# Patient Record
Sex: Female | Born: 1955 | Race: White | Hispanic: No | Marital: Married | Smoking: Former smoker
Health system: Southern US, Community
[De-identification: ages and names within clinical notes are randomized; demographics above are authoritative.]

## PROBLEM LIST (undated history)

## (undated) DIAGNOSIS — E119 Type 2 diabetes mellitus without complications: Secondary | ICD-10-CM

## (undated) DIAGNOSIS — N184 Chronic kidney disease, stage 4 (severe): Secondary | ICD-10-CM

## (undated) DIAGNOSIS — K759 Inflammatory liver disease, unspecified: Secondary | ICD-10-CM

## (undated) DIAGNOSIS — R0600 Dyspnea, unspecified: Secondary | ICD-10-CM

## (undated) DIAGNOSIS — E669 Obesity, unspecified: Secondary | ICD-10-CM

## (undated) DIAGNOSIS — D563 Thalassemia minor: Secondary | ICD-10-CM

## (undated) DIAGNOSIS — G4733 Obstructive sleep apnea (adult) (pediatric): Secondary | ICD-10-CM

## (undated) DIAGNOSIS — K643 Fourth degree hemorrhoids: Secondary | ICD-10-CM

## (undated) DIAGNOSIS — G629 Polyneuropathy, unspecified: Secondary | ICD-10-CM

## (undated) DIAGNOSIS — K7581 Nonalcoholic steatohepatitis (NASH): Secondary | ICD-10-CM

## (undated) DIAGNOSIS — K7460 Unspecified cirrhosis of liver: Secondary | ICD-10-CM

## (undated) DIAGNOSIS — R0989 Other specified symptoms and signs involving the circulatory and respiratory systems: Secondary | ICD-10-CM

## (undated) DIAGNOSIS — R188 Other ascites: Secondary | ICD-10-CM

## (undated) DIAGNOSIS — J189 Pneumonia, unspecified organism: Secondary | ICD-10-CM

## (undated) DIAGNOSIS — M1990 Unspecified osteoarthritis, unspecified site: Secondary | ICD-10-CM

## (undated) DIAGNOSIS — Z95828 Presence of other vascular implants and grafts: Secondary | ICD-10-CM

## (undated) DIAGNOSIS — I10 Essential (primary) hypertension: Secondary | ICD-10-CM

## (undated) DIAGNOSIS — E1140 Type 2 diabetes mellitus with diabetic neuropathy, unspecified: Secondary | ICD-10-CM

## (undated) DIAGNOSIS — K766 Portal hypertension: Secondary | ICD-10-CM

## (undated) DIAGNOSIS — K219 Gastro-esophageal reflux disease without esophagitis: Secondary | ICD-10-CM

## (undated) DIAGNOSIS — G4730 Sleep apnea, unspecified: Secondary | ICD-10-CM

## (undated) DIAGNOSIS — F419 Anxiety disorder, unspecified: Secondary | ICD-10-CM

## (undated) HISTORY — DX: Unspecified cirrhosis of liver: K74.60

## (undated) HISTORY — PX: OTHER SURGICAL HISTORY: SHX169

## (undated) HISTORY — PX: ABDOMINAL HYSTERECTOMY: SHX81

## (undated) HISTORY — PX: JOINT REPLACEMENT: SHX530

## (undated) MED FILL — Iron Sucrose Inj 20 MG/ML (Fe Equiv): INTRAVENOUS | Qty: 15 | Status: AC

---

## 1990-08-18 DIAGNOSIS — D563 Thalassemia minor: Secondary | ICD-10-CM

## 1990-08-18 HISTORY — DX: Thalassemia minor: D56.3

## 2007-03-13 ENCOUNTER — Ambulatory Visit: Payer: Self-pay | Admitting: Neurology

## 2009-04-18 ENCOUNTER — Ambulatory Visit: Payer: Self-pay | Admitting: Gynecologic Oncology

## 2009-04-24 ENCOUNTER — Ambulatory Visit: Payer: Self-pay | Admitting: Gynecologic Oncology

## 2009-05-12 ENCOUNTER — Emergency Department: Payer: Self-pay | Admitting: Emergency Medicine

## 2009-05-18 ENCOUNTER — Ambulatory Visit: Payer: Self-pay | Admitting: Gynecologic Oncology

## 2009-06-12 ENCOUNTER — Ambulatory Visit: Payer: Self-pay | Admitting: Gynecologic Oncology

## 2009-06-18 ENCOUNTER — Ambulatory Visit: Payer: Self-pay | Admitting: Gynecologic Oncology

## 2009-10-16 ENCOUNTER — Ambulatory Visit: Payer: Self-pay | Admitting: Gynecologic Oncology

## 2010-04-18 ENCOUNTER — Ambulatory Visit: Payer: Self-pay | Admitting: Gynecologic Oncology

## 2010-05-07 ENCOUNTER — Ambulatory Visit: Payer: Self-pay | Admitting: Gynecologic Oncology

## 2011-03-19 ENCOUNTER — Ambulatory Visit: Payer: Self-pay | Admitting: Family

## 2011-04-19 ENCOUNTER — Ambulatory Visit: Payer: Self-pay | Admitting: Family

## 2011-05-19 ENCOUNTER — Ambulatory Visit: Payer: Self-pay | Admitting: Family

## 2013-02-04 ENCOUNTER — Emergency Department: Payer: Self-pay | Admitting: Emergency Medicine

## 2015-09-27 HISTORY — PX: TOTAL SHOULDER ARTHROPLASTY: SHX126

## 2016-06-04 ENCOUNTER — Encounter: Payer: Self-pay | Admitting: *Deleted

## 2016-06-11 ENCOUNTER — Ambulatory Visit
Admission: RE | Admit: 2016-06-11 | Discharge: 2016-06-11 | Disposition: A | Discharge status: Home / Self Care | Payer: Managed Care, Other (non HMO) | Source: Ambulatory Visit | Attending: Ophthalmology | Admitting: Ophthalmology

## 2016-06-11 ENCOUNTER — Encounter: Payer: Self-pay | Admitting: *Deleted

## 2016-06-11 ENCOUNTER — Encounter
Admission: RE | Disposition: A | Discharge status: Home / Self Care | Payer: Self-pay | Source: Ambulatory Visit | Attending: Ophthalmology

## 2016-06-11 ENCOUNTER — Ambulatory Visit: Payer: Managed Care, Other (non HMO) | Admitting: Certified Registered Nurse Anesthetist

## 2016-06-11 DIAGNOSIS — K219 Gastro-esophageal reflux disease without esophagitis: Secondary | ICD-10-CM | POA: Insufficient documentation

## 2016-06-11 DIAGNOSIS — D563 Thalassemia minor: Secondary | ICD-10-CM | POA: Insufficient documentation

## 2016-06-11 DIAGNOSIS — E1140 Type 2 diabetes mellitus with diabetic neuropathy, unspecified: Secondary | ICD-10-CM | POA: Insufficient documentation

## 2016-06-11 DIAGNOSIS — F419 Anxiety disorder, unspecified: Secondary | ICD-10-CM | POA: Diagnosis not present

## 2016-06-11 DIAGNOSIS — H2512 Age-related nuclear cataract, left eye: Secondary | ICD-10-CM | POA: Diagnosis present

## 2016-06-11 DIAGNOSIS — Z7984 Long term (current) use of oral hypoglycemic drugs: Secondary | ICD-10-CM | POA: Insufficient documentation

## 2016-06-11 DIAGNOSIS — G4730 Sleep apnea, unspecified: Secondary | ICD-10-CM | POA: Insufficient documentation

## 2016-06-11 DIAGNOSIS — D649 Anemia, unspecified: Secondary | ICD-10-CM | POA: Insufficient documentation

## 2016-06-11 DIAGNOSIS — M1990 Unspecified osteoarthritis, unspecified site: Secondary | ICD-10-CM | POA: Diagnosis not present

## 2016-06-11 DIAGNOSIS — I10 Essential (primary) hypertension: Secondary | ICD-10-CM | POA: Insufficient documentation

## 2016-06-11 DIAGNOSIS — Z90710 Acquired absence of both cervix and uterus: Secondary | ICD-10-CM | POA: Diagnosis not present

## 2016-06-11 HISTORY — DX: Type 2 diabetes mellitus without complications: E11.9

## 2016-06-11 HISTORY — PX: CATARACT EXTRACTION W/PHACO: SHX586

## 2016-06-11 HISTORY — DX: Inflammatory liver disease, unspecified: K75.9

## 2016-06-11 HISTORY — DX: Sleep apnea, unspecified: G47.30

## 2016-06-11 HISTORY — DX: Anxiety disorder, unspecified: F41.9

## 2016-06-11 HISTORY — DX: Polyneuropathy, unspecified: G62.9

## 2016-06-11 HISTORY — DX: Essential (primary) hypertension: I10

## 2016-06-11 HISTORY — DX: Thalassemia minor: D56.3

## 2016-06-11 HISTORY — DX: Unspecified osteoarthritis, unspecified site: M19.90

## 2016-06-11 HISTORY — DX: Other specified symptoms and signs involving the circulatory and respiratory systems: R09.89

## 2016-06-11 LAB — GLUCOSE, CAPILLARY
GLUCOSE-CAPILLARY: 338 mg/dL — AB (ref 65–99)
Glucose-Capillary: 307 mg/dL — ABNORMAL HIGH (ref 65–99)

## 2016-06-11 SURGERY — PHACOEMULSIFICATION, CATARACT, WITH IOL INSERTION
Anesthesia: Monitor Anesthesia Care | Site: Eye | Laterality: Left | Wound class: Clean

## 2016-06-11 MED ORDER — MOXIFLOXACIN HCL 0.5 % OP SOLN
OPHTHALMIC | Status: AC
  Filled 2016-06-11: qty 3

## 2016-06-11 MED ORDER — TETRACAINE HCL 0.5 % OP SOLN
OPHTHALMIC | Status: AC
  Filled 2016-06-11: qty 2

## 2016-06-11 MED ORDER — ACETAMINOPHEN 325 MG PO TABS
ORAL_TABLET | ORAL | Status: AC
  Filled 2016-06-11: qty 2

## 2016-06-11 MED ORDER — TETRACAINE HCL 0.5 % OP SOLN
OPHTHALMIC | Status: DC | PRN
  Administered 2016-06-11: 2 [drp] via OPHTHALMIC

## 2016-06-11 MED ORDER — ACETAMINOPHEN 325 MG PO TABS
650.0000 mg | ORAL_TABLET | Freq: Once | ORAL | Status: AC
  Administered 2016-06-11: 650 mg via ORAL

## 2016-06-11 MED ORDER — MIDAZOLAM HCL 2 MG/2ML IJ SOLN
INTRAMUSCULAR | Status: DC | PRN
  Administered 2016-06-11 (×2): 1 mg via INTRAVENOUS
  Administered 2016-06-11: 0.5 mg via INTRAVENOUS

## 2016-06-11 MED ORDER — NA CHONDROIT SULF-NA HYALURON 40-17 MG/ML IO SOLN
INTRAOCULAR | Status: AC
  Filled 2016-06-11: qty 1

## 2016-06-11 MED ORDER — CEFUROXIME OPHTHALMIC INJECTION 1 MG/0.1 ML
INJECTION | OPHTHALMIC | Status: DC | PRN
  Administered 2016-06-11: 1 mg via INTRACAMERAL

## 2016-06-11 MED ORDER — EPINEPHRINE PF 1 MG/ML IJ SOLN
INTRAOCULAR | Status: DC | PRN
  Administered 2016-06-11: 200 mL via OPHTHALMIC

## 2016-06-11 MED ORDER — LIDOCAINE HCL (PF) 4 % IJ SOLN
INTRAMUSCULAR | Status: AC
  Filled 2016-06-11: qty 5

## 2016-06-11 MED ORDER — CEFUROXIME OPHTHALMIC INJECTION 1 MG/0.1 ML
INJECTION | OPHTHALMIC | Status: AC
  Filled 2016-06-11: qty 0.1

## 2016-06-11 MED ORDER — POVIDONE-IODINE 5 % OP SOLN
OPHTHALMIC | Status: DC | PRN
  Administered 2016-06-11: 1 via OPHTHALMIC

## 2016-06-11 MED ORDER — LIDOCAINE HCL (PF) 4 % IJ SOLN
INTRAMUSCULAR | Status: DC | PRN
  Administered 2016-06-11: 4 mL

## 2016-06-11 MED ORDER — CARBACHOL 0.01 % IO SOLN
INTRAOCULAR | Status: DC | PRN
  Administered 2016-06-11: 0.5 mL via INTRAOCULAR

## 2016-06-11 MED ORDER — MOXIFLOXACIN HCL 0.5 % OP SOLN
OPHTHALMIC | Status: DC | PRN
  Administered 2016-06-11: 1 [drp] via OPHTHALMIC

## 2016-06-11 MED ORDER — MOXIFLOXACIN HCL 0.5 % OP SOLN
1.0000 [drp] | OPHTHALMIC | Status: AC
  Administered 2016-06-11 (×3): 1 [drp] via OPHTHALMIC

## 2016-06-11 MED ORDER — PHENYLEPHRINE HCL 10 % OP SOLN
1.0000 [drp] | OPHTHALMIC | Status: AC
  Administered 2016-06-11 (×4): 1 [drp] via OPHTHALMIC

## 2016-06-11 MED ORDER — SODIUM CHLORIDE 0.9 % IV SOLN
INTRAVENOUS | Status: DC
  Administered 2016-06-11: 07:00:00 via INTRAVENOUS

## 2016-06-11 MED ORDER — BUPIVACAINE HCL (PF) 0.75 % IJ SOLN
INTRAMUSCULAR | Status: AC
  Filled 2016-06-11: qty 10

## 2016-06-11 MED ORDER — CYCLOPENTOLATE HCL 2 % OP SOLN
1.0000 [drp] | OPHTHALMIC | Status: AC
  Administered 2016-06-11 (×3): 1 [drp] via OPHTHALMIC

## 2016-06-11 MED ORDER — LIDOCAINE HCL (PF) 4 % IJ SOLN
INTRAOCULAR | Status: DC | PRN
  Administered 2016-06-11: 4 mL via OPHTHALMIC

## 2016-06-11 MED ORDER — POVIDONE-IODINE 5 % OP SOLN
OPHTHALMIC | Status: AC
  Filled 2016-06-11: qty 30

## 2016-06-11 MED ORDER — CYCLOPENTOLATE HCL 2 % OP SOLN
OPHTHALMIC | Status: AC
  Administered 2016-06-11: 1 [drp]
  Filled 2016-06-11: qty 2

## 2016-06-11 MED ORDER — TRYPAN BLUE 0.06 % OP SOLN
OPHTHALMIC | Status: AC
  Filled 2016-06-11: qty 0.5

## 2016-06-11 MED ORDER — EPINEPHRINE PF 1 MG/ML IJ SOLN
INTRAMUSCULAR | Status: AC
  Filled 2016-06-11: qty 2

## 2016-06-11 MED ORDER — PHENYLEPHRINE HCL 10 % OP SOLN
OPHTHALMIC | Status: AC
  Filled 2016-06-11: qty 5

## 2016-06-11 MED ORDER — HYALURONIDASE HUMAN 150 UNIT/ML IJ SOLN
INTRAMUSCULAR | Status: AC
  Filled 2016-06-11: qty 1

## 2016-06-11 MED ORDER — NA CHONDROIT SULF-NA HYALURON 40-17 MG/ML IO SOLN
INTRAOCULAR | Status: DC | PRN
  Administered 2016-06-11: 1 mL via INTRAOCULAR

## 2016-06-11 MED ORDER — ALFENTANIL 500 MCG/ML IJ INJ
INJECTION | INTRAMUSCULAR | Status: DC | PRN
  Administered 2016-06-11: 500 ug via INTRAVENOUS
  Administered 2016-06-11: 100 ug via INTRAVENOUS
  Administered 2016-06-11 (×2): 150 ug via INTRAVENOUS
  Administered 2016-06-11: 100 ug via INTRAVENOUS

## 2016-06-11 SURGICAL SUPPLY — 30 items
CANNULA ANT/CHMB 27GA (MISCELLANEOUS) ×2 IMPLANT
CORD BIP STRL DISP 12FT (MISCELLANEOUS) ×2 IMPLANT
CUP MEDICINE 2OZ PLAST GRAD ST (MISCELLANEOUS) ×2 IMPLANT
DRAPE XRAY CASSETTE 23X24 (DRAPES) ×2 IMPLANT
ERASER HMR WETFIELD 18G (MISCELLANEOUS) ×2 IMPLANT
GLOVE BIO SURGEON STRL SZ8 (GLOVE) ×2 IMPLANT
GLOVE SURG LX 6.5 MICRO (GLOVE) ×1
GLOVE SURG LX 8.0 MICRO (GLOVE) ×1
GLOVE SURG LX STRL 6.5 MICRO (GLOVE) ×1 IMPLANT
GLOVE SURG LX STRL 8.0 MICRO (GLOVE) ×1 IMPLANT
GOWN STRL REUS W/ TWL LRG LVL3 (GOWN DISPOSABLE) ×1 IMPLANT
GOWN STRL REUS W/ TWL XL LVL3 (GOWN DISPOSABLE) ×1 IMPLANT
GOWN STRL REUS W/TWL LRG LVL3 (GOWN DISPOSABLE) ×1
GOWN STRL REUS W/TWL XL LVL3 (GOWN DISPOSABLE) ×1
LENS IOL ACRSF IQ ULTRA 18.0 (Intraocular Lens) ×1 IMPLANT
LENS IOL ACRYSOF IQ 18.0 (Intraocular Lens) ×2 IMPLANT
PACK CATARACT (MISCELLANEOUS) ×2 IMPLANT
PACK CATARACT DINGLEDEIN LX (MISCELLANEOUS) ×2 IMPLANT
PACK EYE AFTER SURG (MISCELLANEOUS) ×2 IMPLANT
SHLD EYE VISITEC  UNIV (MISCELLANEOUS) ×2 IMPLANT
SOL BSS BAG (MISCELLANEOUS) ×2
SOL PREP PVP 2OZ (MISCELLANEOUS) ×2
SOLUTION BSS BAG (MISCELLANEOUS) ×1 IMPLANT
SOLUTION PREP PVP 2OZ (MISCELLANEOUS) ×1 IMPLANT
SUT SILK 5-0 (SUTURE) ×2 IMPLANT
SYR 3ML LL SCALE MARK (SYRINGE) ×2 IMPLANT
SYR 5ML LL (SYRINGE) ×2 IMPLANT
SYR TB 1ML 27GX1/2 LL (SYRINGE) ×2 IMPLANT
WATER STERILE IRR 250ML POUR (IV SOLUTION) ×2 IMPLANT
WIPE NON LINTING 3.25X3.25 (MISCELLANEOUS) ×2 IMPLANT

## 2016-06-11 NOTE — H&P (Signed)
See scanned note.

## 2016-06-11 NOTE — Op Note (Signed)
Date of Surgery: 06/11/2016 Date of Dictation: 06/11/2016 8:24 AM Pre-operative Diagnosis:  Nuclear Sclerotic Cataract left Eye Post-operative Diagnosis: same Procedure performed: Extra-capsular Cataract Extraction (ECCE) with placement of a posterior chamber intraocular lens (IOL) left Eye IOL:  Implant Name Type Inv. Item Serial No. Manufacturer Lot No. LRB No. Used  LENS IOL ACRYSOF IQ 18.0 - [id number] Intraocular Lens LENS IOL ACRYSOF IQ 18.0 [id number] Moshe Salisbury [id number] Left 1   Anesthesia: 2% Lidocaine and 4% Marcaine in a 50/50 mixture with 10 unites/ml of Hylenex given as a peribulbar Anesthesiologist: Anesthesiologist: Martha Clan, MD CRNA: Demetrius Charity, CRNA Complications: none Estimated Blood Loss: less than 1 ml  Description of procedure:  The patient was given anesthesia and sedation via intravenous access. The patient was then prepped and draped in the usual fashion. A 25-gauge needle was bent for initiating the capsulorhexis. A 5-0 silk suture was placed through the conjunctiva superior and inferiorly to serve as bridle sutures. Hemostasis was obtained at the superior limbus using an eraser cautery. A partial thickness groove was made at the anterior surgical limbus with a 64 Beaver blade and this was dissected anteriorly with an [employer]. The anterior chamber was entered at 10 o'clock with a 1.0 mm paracentesis knife and through the lamellar dissection with a 2.6 mm Alcon keratome. Epi-Shugarcaine 0.5 CC [9 cc BSS Plus (Alcon), 3 cc 4% preservative-free lidocaine (Hospira) and 4 cc 1:1000 preservative-free, bisulfite-free epinephrine] was injected into the anterior chamber via the paracentesis tract. Epi-Shugarcaine 0.5 CC [9 cc BSS Plus (Alcon), 3 cc 4% preservative-free lidocaine (Hospira) and 4 cc 1:1000 preservative-free, bisulfite-free epinephrine] was injected into the anterior chamber via the paracentesis tract. DiscoVisc was injected to replace the  aqueous and a continuous tear curvilinear capsulorhexis was performed using a bent 25-gauge needle.  Balance salt on a syringe was used to perform hydro-dissection and phacoemulsification was carried out using a divide and conquer technique. Procedure(s) with comments: CATARACT EXTRACTION PHACO AND INTRAOCULAR LENS PLACEMENT ([facility]) (Left) - Lot # [id number] H US:01:38.6 AP%:26.4 CDE:44.15. Irrigation/aspiration was used to remove the residual cortex and the capsular bag was inflated with DiscoVisc. The intraocular lens was inserted into the capsular bag using a pre-loaded UltraSert Delivery System. Irrigation/aspiration was used to remove the residual DiscoVisc. The wound was inflated with balanced salt and checked for leaks. None were found. Miostat was injected via the paracentesis track and 0.1 ml of cefuroxime containing 1 mg of drug  was injected via the paracentesis track. The wound was checked for leaks again and none were found.   The bridal sutures were removed and two drops of Vigamox were placed on the eye. An eye shield was placed to protect the eye and the patient was discharged to the recovery area in good condition.   Nimsi Males MD

## 2016-06-11 NOTE — Progress Notes (Signed)
Dr Rosey Bath and Dr Dingeldein aware of GLU [id number].

## 2016-06-11 NOTE — Discharge Instructions (Signed)
Eye Surgery Discharge Instructions   Expect mild scratchy sensation or mild soreness. DO NOT RUB YOUR EYE!  The day of surgery:  Minimal physical activity, but bed rest is not required  No reading, computer work, or close hand work  No bending, lifting, or straining.  May watch TV  For 24 hours:  No driving, legal decisions, or alcoholic beverages  Safety precautions  Eat anything you prefer: It is better to start with liquids, then soup then solid foods.  _____ Eye patch should be worn until postoperative exam tomorrow.  ____ Solar shield eyeglasses should be worn for comfort in the sunlight/patch while sleeping  Resume all regular medications including aspirin or Coumadin if these were discontinued prior to surgery. You may shower, bathe, shave, or wash your hair. Tylenol may be taken for mild discomfort.  Call your doctor if you experience significant pain, nausea, or vomiting, fever > 101 or other signs of infection. [phone] or [phone] Specific instructions:  Follow-up Information    Aitanna Haubner, MD. Go on 06/12/2016.   Specialty:  Ophthalmology Why:  Appointment time is 9:45 am (TOMORROW) Contact information: [address]   [place] [address] [phone]

## 2016-06-11 NOTE — Anesthesia Preprocedure Evaluation (Signed)
Anesthesia Evaluation  Patient identified by MRN, date of birth, ID band Patient awake    Reviewed: Allergy & Precautions, H&P , NPO status , Patient's Chart, lab work & pertinent test results, reviewed documented beta blocker date and time   History of Anesthesia Complications Negative for: history of anesthetic complications  Airway Mallampati: III  TM Distance: >3 FB Neck ROM: full    Dental  (+) Caps, Missing   Pulmonary neg shortness of breath, sleep apnea and Continuous Positive Airway Pressure Ventilation , neg COPD, neg recent URI,    Pulmonary exam normal breath sounds clear to auscultation       Cardiovascular Exercise Tolerance: Good hypertension, (-) angina(-) CAD, (-) Past MI, (-) Cardiac Stents and (-) CABG Normal cardiovascular exam(-) dysrhythmias (-) Valvular Problems/Murmurs Rhythm:regular Rate:Normal     Neuro/Psych negative neurological ROS  negative psych ROS   GI/Hepatic GERD  ,(+) Hepatitis - (unknown type, resolved)  Endo/Other  diabetesMorbid obesity  Renal/GU negative Renal ROS  negative genitourinary   Musculoskeletal   Abdominal   Peds  Hematology  (+) Blood dyscrasia (Thalassemia minor), anemia ,   Anesthesia Other Findings Past Medical History: No date: Anxiety No date: Arthritis No date: Diabetes mellitus without complication (HCC) No date: Hepatitis     Comment: PAST No date: Hypertension No date: Neuropathy (HCC) No date: Sinus complaint No date: Sleep apnea     Comment: CPAP 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   Dental Advisory Given  Plan Discussed with: Anesthesiologist, CRNA and Surgeon  Anesthesia Plan Comments:         Anesthesia Quick Evaluation

## 2016-06-11 NOTE — Anesthesia Postprocedure Evaluation (Signed)
Anesthesia Post Note  Patient: Rachel Huber  Procedure(s) Performed: Procedure(s) (LRB): CATARACT EXTRACTION PHACO AND INTRAOCULAR LENS PLACEMENT ([facility]) (Left)  Patient location during evaluation: PACU Anesthesia Type: MAC Level of consciousness: oriented and awake and alert Pain management: satisfactory to patient Vital Signs Assessment: post-procedure vital signs reviewed and stable Respiratory status: respiratory function stable Cardiovascular status: stable Anesthetic complications: no    Last Vitals:  Vitals:   06/11/16 0616 06/11/16 0825  BP: (!) 151/66 (!) 112/46  Pulse: 87 78  Resp: 12 14  Temp: 36.6 C 37.1 C    Last Pain:  Vitals:   06/11/16 0616  TempSrc: Oral                 Blima Singer

## 2016-06-11 NOTE — Anesthesia Procedure Notes (Signed)
Procedure Name: MAC Performed by: Demetrius Charity Pre-anesthesia Checklist: Patient identified, Emergency Drugs available, Suction available, Timeout performed and Patient being monitored Oxygen Delivery Method: Nasal cannula

## 2016-06-11 NOTE — Interval H&P Note (Signed)
History and Physical Interval Note:  06/11/2016 7:23 AM  Rachel Huber  has presented today for surgery, with the diagnosis of CATARACT  The various methods of treatment have been discussed with the patient and family. After consideration of risks, benefits and other options for treatment, the patient has consented to  Procedure(s): CATARACT EXTRACTION PHACO AND INTRAOCULAR LENS PLACEMENT ([place]) (Left) as a surgical intervention .  The patient's history has been reviewed, patient examined, no change in status, stable for surgery.  I have reviewed the patient's chart and labs.  Questions were answered to the patient's satisfaction.     Rateel Beldin

## 2016-06-11 NOTE — Transfer of Care (Signed)
Immediate Anesthesia Transfer of Care Note  Patient: Rachel Huber  Procedure(s) Performed: Procedure(s) with comments: CATARACT EXTRACTION PHACO AND INTRAOCULAR LENS PLACEMENT ([facility]) (Left) - Lot # [id number] H US:01:38.6 AP%:26.4 CDE:44.15  Patient Location: PACU  Anesthesia Type:MAC  Level of Consciousness: awake, alert  and oriented  Airway & Oxygen Therapy: Patient Spontanous Breathing  Post-op Assessment: Report given to RN and Post -op Vital signs reviewed and stable  Post vital signs: Reviewed and stable  Last Vitals:  Vitals:   06/11/16 0616 06/11/16 0825  BP: (!) 151/66 (!) 112/46  Pulse: 87 78  Resp: 12 14  Temp: 36.6 C 37.1 C    Last Pain:  Vitals:   06/11/16 0616  TempSrc: Oral         Complications: No apparent anesthesia complications

## 2016-06-13 ENCOUNTER — Encounter: Payer: Self-pay | Admitting: Ophthalmology

## 2017-09-21 ENCOUNTER — Emergency Department: Payer: Managed Care, Other (non HMO)

## 2017-09-21 ENCOUNTER — Emergency Department
Admission: EM | Admit: 2017-09-21 | Discharge: 2017-09-21 | Disposition: A | Discharge status: Home / Self Care | Payer: Managed Care, Other (non HMO) | Attending: Emergency Medicine | Admitting: Emergency Medicine

## 2017-09-21 DIAGNOSIS — S82101A Unspecified fracture of upper end of right tibia, initial encounter for closed fracture: Secondary | ICD-10-CM | POA: Diagnosis not present

## 2017-09-21 DIAGNOSIS — S42291A Other displaced fracture of upper end of right humerus, initial encounter for closed fracture: Secondary | ICD-10-CM

## 2017-09-21 DIAGNOSIS — S8991XA Unspecified injury of right lower leg, initial encounter: Secondary | ICD-10-CM | POA: Diagnosis present

## 2017-09-21 DIAGNOSIS — Y999 Unspecified external cause status: Secondary | ICD-10-CM | POA: Insufficient documentation

## 2017-09-21 DIAGNOSIS — Y939 Activity, unspecified: Secondary | ICD-10-CM | POA: Insufficient documentation

## 2017-09-21 DIAGNOSIS — S32009A Unspecified fracture of unspecified lumbar vertebra, initial encounter for closed fracture: Secondary | ICD-10-CM

## 2017-09-21 DIAGNOSIS — Z79899 Other long term (current) drug therapy: Secondary | ICD-10-CM | POA: Insufficient documentation

## 2017-09-21 DIAGNOSIS — Z7984 Long term (current) use of oral hypoglycemic drugs: Secondary | ICD-10-CM | POA: Diagnosis not present

## 2017-09-21 DIAGNOSIS — E119 Type 2 diabetes mellitus without complications: Secondary | ICD-10-CM | POA: Diagnosis not present

## 2017-09-21 DIAGNOSIS — I10 Essential (primary) hypertension: Secondary | ICD-10-CM | POA: Insufficient documentation

## 2017-09-21 DIAGNOSIS — M542 Cervicalgia: Secondary | ICD-10-CM | POA: Diagnosis not present

## 2017-09-21 DIAGNOSIS — S82832A Other fracture of upper and lower end of left fibula, initial encounter for closed fracture: Secondary | ICD-10-CM

## 2017-09-21 DIAGNOSIS — Y929 Unspecified place or not applicable: Secondary | ICD-10-CM | POA: Diagnosis not present

## 2017-09-21 DIAGNOSIS — S2000XA Contusion of breast, unspecified breast, initial encounter: Secondary | ICD-10-CM | POA: Diagnosis not present

## 2017-09-21 DIAGNOSIS — R0789 Other chest pain: Secondary | ICD-10-CM | POA: Diagnosis not present

## 2017-09-21 DIAGNOSIS — S82141A Displaced bicondylar fracture of right tibia, initial encounter for closed fracture: Secondary | ICD-10-CM

## 2017-09-21 DIAGNOSIS — S3991XA Unspecified injury of abdomen, initial encounter: Secondary | ICD-10-CM | POA: Insufficient documentation

## 2017-09-21 DIAGNOSIS — S42201A Unspecified fracture of upper end of right humerus, initial encounter for closed fracture: Secondary | ICD-10-CM | POA: Insufficient documentation

## 2017-09-21 LAB — BASIC METABOLIC PANEL
Anion gap: 12 (ref 5–15)
BUN: 24 mg/dL — AB (ref 6–20)
CALCIUM: 9.1 mg/dL (ref 8.9–10.3)
CO2: 20 mmol/L — ABNORMAL LOW (ref 22–32)
CREATININE: 1.23 mg/dL — AB (ref 0.44–1.00)
Chloride: 102 mmol/L (ref 101–111)
GFR calc non Af Amer: 47 mL/min — ABNORMAL LOW (ref >60)
GFR, EST AFRICAN AMERICAN: 55 mL/min — AB (ref >60)
Glucose, Bld: 484 mg/dL — ABNORMAL HIGH (ref 65–99)
Potassium: 4.1 mmol/L (ref 3.5–5.1)
SODIUM: 134 mmol/L — AB (ref 135–145)

## 2017-09-21 LAB — CBC
HCT: 25.2 % — ABNORMAL LOW (ref 35.0–47.0)
Hemoglobin: 7.8 g/dL — ABNORMAL LOW (ref 12.0–16.0)
MCH: 19.1 pg — AB (ref 26.0–34.0)
MCHC: 30.9 g/dL — AB (ref 32.0–36.0)
MCV: 61.9 fL — ABNORMAL LOW (ref 80.0–100.0)
Platelets: 164 10*3/uL (ref 150–440)
RBC: 4.07 MIL/uL (ref 3.80–5.20)
RDW: 15.4 % — AB (ref 11.5–14.5)
WBC: 9.7 10*3/uL (ref 3.6–11.0)

## 2017-09-21 MED ORDER — MORPHINE SULFATE (PF) 4 MG/ML IV SOLN
4.0000 mg | Freq: Once | INTRAVENOUS | Status: AC
  Administered 2017-09-21: 4 mg via INTRAVENOUS
  Filled 2017-09-21: qty 1

## 2017-09-21 MED ORDER — MORPHINE SULFATE (PF) 4 MG/ML IV SOLN
INTRAVENOUS | Status: AC
  Filled 2017-09-21: qty 1

## 2017-09-21 MED ORDER — IOPAMIDOL (ISOVUE-300) INJECTION 61%: 75.0000 mL | Freq: Once | INTRAVENOUS | Status: DC | PRN

## 2017-09-21 MED ORDER — ONDANSETRON HCL 4 MG/2ML IJ SOLN
4.0000 mg | Freq: Once | INTRAMUSCULAR | Status: AC
  Administered 2017-09-21: 4 mg via INTRAVENOUS
  Filled 2017-09-21: qty 2

## 2017-09-21 MED ORDER — MORPHINE SULFATE (PF) 4 MG/ML IV SOLN
4.0000 mg | Freq: Once | INTRAVENOUS | Status: AC
  Administered 2017-09-21: 4 mg via INTRAVENOUS

## 2017-09-21 MED ORDER — IOPAMIDOL (ISOVUE-300) INJECTION 61%
100.0000 mL | Freq: Once | INTRAVENOUS | Status: AC | PRN
  Administered 2017-09-21: 100 mL via INTRAVENOUS

## 2017-09-21 MED ORDER — MORPHINE SULFATE (PF) 4 MG/ML IV SOLN
INTRAVENOUS | Status: AC
  Administered 2017-09-21: 4 mg via INTRAVENOUS
  Filled 2017-09-21: qty 1
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
## 2017-09-21 NOTE — ED Notes (Signed)
Shoulder immobilizer applied right shoulder. Knee immobilizers applied bilaterally

## 2017-09-21 NOTE — ED Notes (Signed)
Patient's right hand and laceration site cleaned with sterile saline solution

## 2017-09-21 NOTE — ED Notes (Signed)
Steri-Strips applied to laceration 2nd digit right hand per MD Dahlia Client request

## 2017-09-21 NOTE — ED Notes (Signed)
ED Provider at bedside. 

## 2017-09-21 NOTE — ED Notes (Signed)
Patient has laceration to 2nd digit, right hand, proximal to hand
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
## 2017-09-21 NOTE — ED Notes (Signed)
Patient's right hand wrapped in sterile gauze with sterile saline to loosen dried blood and debris on laceration

## 2017-09-24 HISTORY — PX: KNEE SURGERY: SHX244

## 2017-12-24 ENCOUNTER — Ambulatory Visit (HOSPITAL_COMMUNITY): Payer: 59 | Attending: Internal Medicine

## 2017-12-24 ENCOUNTER — Encounter (HOSPITAL_COMMUNITY): Payer: Self-pay

## 2017-12-24 DIAGNOSIS — G8929 Other chronic pain: Secondary | ICD-10-CM | POA: Insufficient documentation

## 2017-12-24 DIAGNOSIS — R29898 Other symptoms and signs involving the musculoskeletal system: Secondary | ICD-10-CM | POA: Insufficient documentation

## 2017-12-24 DIAGNOSIS — M25611 Stiffness of right shoulder, not elsewhere classified: Secondary | ICD-10-CM | POA: Diagnosis present

## 2017-12-24 DIAGNOSIS — R262 Difficulty in walking, not elsewhere classified: Secondary | ICD-10-CM | POA: Insufficient documentation

## 2017-12-24 DIAGNOSIS — M25571 Pain in right ankle and joints of right foot: Secondary | ICD-10-CM | POA: Diagnosis present

## 2017-12-24 DIAGNOSIS — M6281 Muscle weakness (generalized): Secondary | ICD-10-CM | POA: Diagnosis present

## 2017-12-24 DIAGNOSIS — M25561 Pain in right knee: Secondary | ICD-10-CM | POA: Insufficient documentation

## 2017-12-24 DIAGNOSIS — M25511 Pain in right shoulder: Secondary | ICD-10-CM | POA: Insufficient documentation
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
## 2017-12-24 NOTE — Patient Instructions (Signed)
  QUAD SET  Tighten your top thigh muscle as you attempt to press the back of your knee downward towards the table.  Hold for 5-10 seconds, 2-3 sets of 10-15 reps    HEEL SLIDES - LONG SIT WITH TOWEL AND BELT  While in a sitting position, place a small hand towel under your heel. Next, loop a belt, towel or bed sheet around your foot and pull your knee into a bend position as your foot slides towards your buttock. Hold a gentle stretch and then return back to original position.  Can lay down or sit up like the guy in the picutre -- use long belt or sheet -- 2-3 sets of 10-15 reps holding for 5-10 sec at end range

## 2017-12-30 ENCOUNTER — Other Ambulatory Visit: Payer: Self-pay | Admitting: Obstetrics and Gynecology

## 2017-12-30 DIAGNOSIS — N6310 Unspecified lump in the right breast, unspecified quadrant: Secondary | ICD-10-CM

## 2017-12-31 ENCOUNTER — Ambulatory Visit (HOSPITAL_COMMUNITY): Payer: 59 | Admitting: Physical Therapy

## 2017-12-31 ENCOUNTER — Encounter (HOSPITAL_COMMUNITY): Payer: Self-pay | Admitting: Physical Therapy

## 2017-12-31 DIAGNOSIS — R262 Difficulty in walking, not elsewhere classified: Secondary | ICD-10-CM | POA: Diagnosis not present

## 2017-12-31 DIAGNOSIS — M6281 Muscle weakness (generalized): Secondary | ICD-10-CM

## 2017-12-31 DIAGNOSIS — M25561 Pain in right knee: Secondary | ICD-10-CM

## 2017-12-31 DIAGNOSIS — M25571 Pain in right ankle and joints of right foot: Secondary | ICD-10-CM

## 2017-12-31 DIAGNOSIS — G8929 Other chronic pain: Secondary | ICD-10-CM

## 2017-12-31 NOTE — Therapy (Signed)
[facility] [place], [place], [address] Phone: [phone]   Fax:  [phone]  Physical Therapy Treatment  Patient Details  Name: Rachel Huber MRN: [id number] Date of Birth: Mar 18, 1956 Referring Provider: Garwin Brothers, MD (Surgeon: Dr. Sandi Mealy at [facility])   Encounter Date: 12/31/2017  PT End of Session - 12/31/17 0953    Visit Number  2    Number of Visits  17    Date for PT Re-Evaluation  02/18/18 01/21/18    Authorization Type  Med Pay Assurance (will update pt's personal health insurance info once obtained and include any visit limitations, etc.)    Authorization Time Period  12/24/17 to 02/18/18    PT Start Time  0920 started with Azucena Freed, PT 9:20-9:48    PT Stop Time  1020    PT Time Calculation (min)  60 min    Activity Tolerance  Patient tolerated treatment well    Behavior During Therapy  [facility] for tasks assessed/performed       Past Medical History:  Diagnosis Date  . Anxiety   . Arthritis   . Diabetes mellitus without complication ([place])   . Hepatitis    PAST  . Hypertension   . Neuropathy   . Sinus complaint   . Sleep apnea    CPAP  . Thalassemia minor     Past Surgical History:  Procedure Laterality Date  . ABDOMINAL HYSTERECTOMY    . CATARACT EXTRACTION W/PHACO Left 06/11/2016   Procedure: CATARACT EXTRACTION PHACO AND INTRAOCULAR LENS PLACEMENT (IOC);  Surgeon: Estill Cotta, MD;  Location: [facility] ORS;  Service: Ophthalmology;  Laterality: Left;  Lot # [id number] H US:01:38.6 AP%:26.4 CDE:44.15  . COLONOSCOPIES    . CTR      There were no vitals filed for this visit.  Subjective Assessment - 12/31/17 0920    Subjective  PT states that she has a little soreness at this time.  She took some advil this morningn     Limitations  Walking;Standing;House hold activities    How long can you sit comfortably?  no issues    How long can you stand comfortably?  5-10 mins but has to sit to get dressed     How long can you walk comfortably?  couple blocks with RW    Patient Stated Goals  get off of the walker    Currently in Pain?  Yes    Pain Score  2     Pain Location  Knee    Pain Orientation  Right    Pain Descriptors / Indicators  Aching;Tightness    Pain Type  Acute pain    Pain Onset  1 to 4 weeks ago    Pain Frequency  Intermittent    Pain Relieving Factors  advil                       [facility] Adult PT Treatment/Exercise - 12/31/17 0001      Ambulation/Gait   Ambulation Distance (Feet)  318 Feet 180 ft with SPC     Assistive device  Rolling walker    Gait velocity  0.54 m/sec    Gait Comments  3 MWT       Exercises   Exercises  Knee/Hip      Knee/Hip Exercises: Stretches   Passive Hamstring Stretch  Right;3 reps;30 seconds    Other Knee/Hip Stretches  slant board 30" x 3  Knee/Hip Exercises: Standing   Heel Raises  10 reps    Knee Flexion  Right;10 reps    Terminal Knee Extension  Right;10 reps    Stairs  step to using SPC 4" and 6" 1Rt no HR    Rocker Board  2 minutes      Knee/Hip Exercises: Supine   Knee Extension  AROM    Knee Extension Limitations  15    Knee Flexion  AROM    Knee Flexion Limitations  125             PT Education - 12/31/17 0953    Education provided  Yes    Education Details  reviewed evaluation goals and HEP    Person(s) Educated  Patient    Methods  Explanation;Handout;Demonstration;Tactile cues    Comprehension  Verbalized understanding       PT Short Term Goals - 12/24/17 1123      PT SHORT TERM GOAL #1   Title  Pt will be independent wtih HEP and perform consistently in order to decrease pain and improve overall function.    Time  4    Period  Weeks    Status  New    Target Date  01/21/18      PT SHORT TERM GOAL #2   Title  Pt will have improved R knee ROM from 0-120deg without pain at end range to maximize gait.    Time  4    Period  Weeks    Status  New      PT SHORT TERM GOAL #3    Title  Pt will have 1/2 grade improvement throughout BLE MMT in order to maximize gait, balance, and allow pt to complete functional tasks with greater ease.    Time  4    Period  Weeks    Status  New      PT SHORT TERM GOAL #4   Title  Pt will be able to perform R SLS for 5 sec with no UE assist to demo improved balance and functional strengh in order to maximize gait and stair ambulation.    Time  4    Period  Weeks    Status  New        PT Long Term Goals - 12/24/17 1136      PT LONG TERM GOAL #1   Title  Pt will have 1 grade improvement throughout BLE MMT in order to further maximize gait, balance, and promote return to PLOF.    Time  8    Period  Weeks    Status  New    Target Date  02/18/18      PT LONG TERM GOAL #2   Title  Pt will be able to perform R SLS for 10 sec or > with no UE assist in order to further demo improved balance, functional strength and maximize overall function.    Time  8    Period  Weeks    Status  New      PT LONG TERM GOAL #3   Title  Pt will be able to perform 5xSTS in 10 sec or < with no UE support and proper mechanics to demo improved balance and functional BLE strength.    Time  8    Period  Weeks    Status  New      PT LONG TERM GOAL #4   Title  Pt will be able to perform the TUG in  12 sec or < with LRAD in order to demo improved balance and decrease her risk for falls.    Time  8    Period  Weeks    Status  New      PT LONG TERM GOAL #5   Title  Pt will be able to ambulate at least 631ft or > during the 3MWT with LRAD and without increases in pain to demo improved tolerance to WB and maximize her ability to return to work once cleared by MD to do so.    Time  8    Period  Weeks    Status  New            Plan - 12/31/17 1023    Clinical Impression Statement  reveiwed evaluation goals, HEP and completed 3MWT initial part of session with Azucena Freed, PT.  continued with additional exercise instruction, gait using SPC and stair  negotiation using SPC all with good form and minimal cues.  Pt achieved 15-125 degrees AROM today in supine.  Pt without complaints of pain or issues during or after session.      Rehab Potential  Good    PT Frequency  2x / week    PT Duration  8 weeks    PT Treatment/Interventions  ADLs/Self Care Home Management;Biofeedback;Cryotherapy;Electrical Stimulation;Moist Heat;Ultrasound;DME Instruction;Gait training;Stair training;Functional mobility training;Therapeutic activities;Therapeutic exercise;Balance training;Neuromuscular re-education;Patient/family education;Orthotic Fit/Training;Manual techniques;Scar mobilization;Passive range of motion;Dry needling;Energy conservation;Taping    PT Next Visit Plan  PRogress LE and functional strengthening, gait with LRAD.  Complete balance training as tolerated    PT Home Exercise Plan  eval: quad sets, heel slides    Consulted and Agree with Plan of Care  Patient       Patient will benefit from skilled therapeutic intervention in order to improve the following deficits and impairments:  Abnormal gait, Decreased activity tolerance, Decreased balance, Decreased endurance, Decreased mobility, Decreased range of motion, Decreased strength, Difficulty walking, Hypomobility, Increased fascial restricitons, Increased muscle spasms, Impaired flexibility, Improper body mechanics, Impaired UE functional use, Postural dysfunction, Pain  Visit Diagnosis: Difficulty in walking, not elsewhere classified  Muscle weakness (generalized)  Chronic pain of right knee  Pain in right ankle and joints of right foot     Problem List There are no active problems to display for this patient.  Teena Irani, PTA/CLT [phone]  Teena Irani 12/31/2017, 10:31 AM  [place], [place], [address] Phone: [phone]   Fax:  [phone]  Name: AYMARA SASSI MRN: [id number] Date of Birth:  [birth year]

## 2018-01-04 ENCOUNTER — Telehealth (HOSPITAL_COMMUNITY): Payer: Self-pay | Admitting: Physical Therapy

## 2018-01-04 NOTE — Telephone Encounter (Signed)
01-04-18 I called to remind the patient to bring her insurance card. I spoke with patient's daughter and she said they would bring it tomorrow.

## 2018-01-05 ENCOUNTER — Ambulatory Visit (HOSPITAL_COMMUNITY): Payer: 59 | Admitting: Physical Therapy

## 2018-01-05 DIAGNOSIS — M25561 Pain in right knee: Secondary | ICD-10-CM

## 2018-01-05 DIAGNOSIS — R262 Difficulty in walking, not elsewhere classified: Secondary | ICD-10-CM | POA: Diagnosis not present

## 2018-01-05 DIAGNOSIS — G8929 Other chronic pain: Secondary | ICD-10-CM

## 2018-01-05 DIAGNOSIS — M25571 Pain in right ankle and joints of right foot: Secondary | ICD-10-CM

## 2018-01-05 DIAGNOSIS — M6281 Muscle weakness (generalized): Secondary | ICD-10-CM

## 2018-01-05 NOTE — Therapy (Signed)
[place], [place], [address] Phone: [phone]   Fax:  [phone]  Physical Therapy Treatment  Patient Details  Name: Rachel Huber MRN: [id number] Date of Birth: 05/28/1956 Referring Provider: Garwin Brothers, MD (Surgeon: Dr. Sandi Mealy at [facility])   Encounter Date: 01/05/2018  PT End of Session - 01/05/18 1153    Visit Number  3    Number of Visits  17    Date for PT Re-Evaluation  02/18/18 01/21/18    Authorization Type  [employer] (will update pt's personal health insurance info once obtained and include any visit limitations, etc.)    Authorization Time Period  12/24/17 to 02/18/18    PT Start Time  1005    PT Stop Time  1050    PT Time Calculation (min)  45 min    Activity Tolerance  Patient tolerated treatment well    Behavior During Therapy  [facility] for tasks assessed/performed       Past Medical History:  Diagnosis Date  . Anxiety   . Arthritis   . Diabetes mellitus without complication ([place])   . Hepatitis    PAST  . Hypertension   . Neuropathy   . Sinus complaint   . Sleep apnea    CPAP  . Thalassemia minor     Past Surgical History:  Procedure Laterality Date  . ABDOMINAL HYSTERECTOMY    . CATARACT EXTRACTION W/PHACO Left 06/11/2016   Procedure: CATARACT EXTRACTION PHACO AND INTRAOCULAR LENS PLACEMENT (IOC);  Surgeon: Estill Cotta, MD;  Location: [facility] ORS;  Service: Ophthalmology;  Laterality: Left;  Lot # [id number] H US:01:38.6 AP%:26.4 CDE:44.15  . COLONOSCOPIES    . CTR      There were no vitals filed for this visit.  Subjective Assessment - 01/05/18 1007    Subjective  PT reports 1/10 today.  states she's loving using the [facility] over the walker and has been using it since her first visit.     Currently in Pain?  Yes    Pain Score  1     Pain Location  Knee    Pain Orientation  Right    Pain Descriptors / Indicators  Aching    Pain Type  Acute pain                        [facility] Adult PT Treatment/Exercise - 01/05/18 0001      Knee/Hip Exercises: Stretches   Passive Hamstring Stretch  Both;2 reps;30 seconds    Gastroc Stretch  Both;3 reps;30 seconds;Limitations    Gastroc Stretch Limitations  on step      Knee/Hip Exercises: Standing   Heel Raises  15 reps    Knee Flexion  Right;15 reps    Forward Lunges  Both;10 reps;Limitations    Forward Lunges Limitations  onto 6" step no UE's    Hip Abduction  Both;10 reps    Hip Extension  Both;10 reps    Stairs  6" step 2RT with SPC step to gait    Gait Training  with SPC 226 feet      Knee/Hip Exercises: Seated   Long Arc Quad  Both;15 reps      Knee/Hip Exercises: Supine   Quad Sets  Right;10 reps    Heel Slides  Right;10 reps    Bridges  10 reps    Knee Extension  AROM    Knee Extension Limitations  10  Knee Flexion  AROM    Knee Flexion Limitations  125      Knee/Hip Exercises: Sidelying   Hip ABduction  Both;10 reps               PT Short Term Goals - 12/24/17 1123      PT SHORT TERM GOAL #1   Title  Pt will be independent wtih HEP and perform consistently in order to decrease pain and improve overall function.    Time  4    Period  Weeks    Status  New    Target Date  01/21/18      PT SHORT TERM GOAL #2   Title  Pt will have improved R knee ROM from 0-120deg without pain at end range to maximize gait.    Time  4    Period  Weeks    Status  New      PT SHORT TERM GOAL #3   Title  Pt will have 1/2 grade improvement throughout BLE MMT in order to maximize gait, balance, and allow pt to complete functional tasks with greater ease.    Time  4    Period  Weeks    Status  New      PT SHORT TERM GOAL #4   Title  Pt will be able to perform R SLS for 5 sec with no UE assist to demo improved balance and functional strengh in order to maximize gait and stair ambulation.    Time  4    Period  Weeks    Status  New        PT Long Term Goals -  12/24/17 1136      PT LONG TERM GOAL #1   Title  Pt will have 1 grade improvement throughout BLE MMT in order to further maximize gait, balance, and promote return to PLOF.    Time  8    Period  Weeks    Status  New    Target Date  02/18/18      PT LONG TERM GOAL #2   Title  Pt will be able to perform R SLS for 10 sec or > with no UE assist in order to further demo improved balance, functional strength and maximize overall function.    Time  8    Period  Weeks    Status  New      PT LONG TERM GOAL #3   Title  Pt will be able to perform 5xSTS in 10 sec or < with no UE support and proper mechanics to demo improved balance and functional BLE strength.    Time  8    Period  Weeks    Status  New      PT LONG TERM GOAL #4   Title  Pt will be able to perform the TUG in 12 sec or < with LRAD in order to demo improved balance and decrease her risk for falls.    Time  8    Period  Weeks    Status  New      PT LONG TERM GOAL #5   Title  Pt will be able to ambulate at least 626ft or > during the 3MWT with LRAD and without increases in pain to demo improved tolerance to WB and maximize her ability to return to work once cleared by MD to do so.    Time  8    Period  Weeks    Status  New            Plan - 01/05/18 1150    Clinical Impression Statement  Continued with established therex with improved gait, ROM and pain noted today.  PT now ambulating with SPC and completing without antalgia.  Progressed standing therex wtithout discomfort or diffiuclty.  Increased ease with stair negotiation with tendency to descend with feet sideways (pt reports because her feet are large).  ROM today in supine 10-125 degrees.  No issues reported.      Rehab Potential  Good    PT Frequency  2x / week    PT Duration  8 weeks    PT Treatment/Interventions  ADLs/Self Care Home Management;Biofeedback;Cryotherapy;Electrical Stimulation;Moist Heat;Ultrasound;DME Instruction;Gait training;Stair  training;Functional mobility training;Therapeutic activities;Therapeutic exercise;Balance training;Neuromuscular re-education;Patient/family education;Orthotic Fit/Training;Manual techniques;Scar mobilization;Passive range of motion;Dry needling;Energy conservation;Taping    PT Next Visit Plan  PRogress LE and functional strengthening, gait with LRAD.  Complete balance training as tolerated.  Next session begin SLS and tandem stance, progress balance and stabiltiy.     PT Home Exercise Plan  eval: quad sets, heel slides    Consulted and Agree with Plan of Care  Patient       Patient will benefit from skilled therapeutic intervention in order to improve the following deficits and impairments:  Abnormal gait, Decreased activity tolerance, Decreased balance, Decreased endurance, Decreased mobility, Decreased range of motion, Decreased strength, Difficulty walking, Hypomobility, Increased fascial restricitons, Increased muscle spasms, Impaired flexibility, Improper body mechanics, Impaired UE functional use, Postural dysfunction, Pain  Visit Diagnosis: Difficulty in walking, not elsewhere classified  Muscle weakness (generalized)  Chronic pain of right knee  Pain in right ankle and joints of right foot     Problem List There are no active problems to display for this patient.  Teena Irani, PTA/CLT [phone]  Teena Irani 01/05/2018, 11:53 AM  [place], [place], [address] Phone: [phone]   Fax:  [phone]  Name: Rachel Huber MRN: [id number] Date of Birth: October 29, 1955

## 2018-01-07 ENCOUNTER — Encounter (HOSPITAL_COMMUNITY): Payer: Self-pay | Admitting: Specialist

## 2018-01-07 ENCOUNTER — Other Ambulatory Visit: Payer: Self-pay

## 2018-01-07 ENCOUNTER — Ambulatory Visit (HOSPITAL_COMMUNITY): Payer: 59 | Admitting: Specialist

## 2018-01-07 ENCOUNTER — Ambulatory Visit (HOSPITAL_COMMUNITY): Payer: 59 | Admitting: Physical Therapy

## 2018-01-07 DIAGNOSIS — M25561 Pain in right knee: Secondary | ICD-10-CM

## 2018-01-07 DIAGNOSIS — M6281 Muscle weakness (generalized): Secondary | ICD-10-CM

## 2018-01-07 DIAGNOSIS — M25611 Stiffness of right shoulder, not elsewhere classified: Secondary | ICD-10-CM

## 2018-01-07 DIAGNOSIS — R262 Difficulty in walking, not elsewhere classified: Secondary | ICD-10-CM

## 2018-01-07 DIAGNOSIS — M25511 Pain in right shoulder: Secondary | ICD-10-CM

## 2018-01-07 DIAGNOSIS — G8929 Other chronic pain: Secondary | ICD-10-CM

## 2018-01-07 DIAGNOSIS — R29898 Other symptoms and signs involving the musculoskeletal system: Secondary | ICD-10-CM

## 2018-01-07 NOTE — Therapy (Signed)
[place], [place], [address] Phone: [phone]   Fax:  [phone]  Physical Therapy Treatment  Patient Details  Name: Rachel Huber MRN: [id number] Date of Birth: 02/18/1956 Referring Provider: Garwin Brothers, MD (Surgeon: Dr. Sandi Mealy at [facility])   Encounter Date: 01/07/2018  PT End of Session - 01/07/18 1148    Visit Number  4    Number of Visits  17    Date for PT Re-Evaluation  02/18/18 01/21/18    Authorization Type  [employer] (will update pt's personal health insurance info once obtained and include any visit limitations, etc.)    Authorization Time Period  12/24/17 to 02/18/18    PT Start Time  0947    PT Stop Time  1032    PT Time Calculation (min)  45 min    Activity Tolerance  Patient tolerated treatment well    Behavior During Therapy  [facility] for tasks assessed/performed       Past Medical History:  Diagnosis Date  . Anxiety   . Arthritis   . Diabetes mellitus without complication ([place])   . Hepatitis    PAST  . Hypertension   . Neuropathy   . Sinus complaint   . Sleep apnea    CPAP  . Thalassemia minor     Past Surgical History:  Procedure Laterality Date  . ABDOMINAL HYSTERECTOMY    . CATARACT EXTRACTION W/PHACO Left 06/11/2016   Procedure: CATARACT EXTRACTION PHACO AND INTRAOCULAR LENS PLACEMENT (IOC);  Surgeon: Estill Cotta, MD;  Location: [facility] ORS;  Service: Ophthalmology;  Laterality: Left;  Lot # [id number] H US:01:38.6 AP%:26.4 CDE:44.15  . COLONOSCOPIES    . CTR      There were no vitals filed for this visit.  Subjective Assessment - 01/07/18 0951    Subjective  PT reports no pain or issues in her LE.  Just finished OT for her shoulder and it's a little irritated from working it. Continues to use the [facility]    Currently in Pain?  No/denies                       [facility] Adult PT Treatment/Exercise - 01/07/18 1148      Knee/Hip Exercises: Stretches   Passive  Hamstring Stretch  Right;3 reps;30 seconds;Limitations    Passive Hamstring Stretch Limitations  onto 12" step    Gastroc Stretch  Both;3 reps;30 seconds;Limitations    Gastroc Stretch Limitations  slant board      Knee/Hip Exercises: Standing   Heel Raises  15 reps;Limitations    Heel Raises Limitations  toeraises 15 reps    Knee Flexion  Right;15 reps    Forward Lunges  Both;Limitations;15 reps    Forward Lunges Limitations  onto 4" step no UE's    Terminal Knee Extension  Right;10 reps;Limitations    Terminal Knee Extension Limitations  5 second holds with GTB    Hip Abduction  Both;15 reps    Hip Extension  Both;15 reps    Lateral Step Up  10 reps;Step Height: 4";Right;Hand Hold: 2    Forward Step Up  Right;10 reps;Step Height: 4";Hand Hold: 1      Knee/Hip Exercises: Supine   Knee Extension  AROM    Knee Extension Limitations  6 Lt is 5    Knee Flexion  AROM    Knee Flexion Limitations  130 Lt is 130      Manual Therapy  Manual Therapy  Myofascial release    Manual therapy comments  completed seperately from all other skilled interventions    Myofascial Release  prone to posterior knee               PT Short Term Goals - 12/24/17 1123      PT SHORT TERM GOAL #1   Title  Pt will be independent wtih HEP and perform consistently in order to decrease pain and improve overall function.    Time  4    Period  Weeks    Status  New    Target Date  01/21/18      PT SHORT TERM GOAL #2   Title  Pt will have improved R knee ROM from 0-120deg without pain at end range to maximize gait.    Time  4    Period  Weeks    Status  New      PT SHORT TERM GOAL #3   Title  Pt will have 1/2 grade improvement throughout BLE MMT in order to maximize gait, balance, and allow pt to complete functional tasks with greater ease.    Time  4    Period  Weeks    Status  New      PT SHORT TERM GOAL #4   Title  Pt will be able to perform R SLS for 5 sec with no UE assist to demo  improved balance and functional strengh in order to maximize gait and stair ambulation.    Time  4    Period  Weeks    Status  New        PT Long Term Goals - 12/24/17 1136      PT LONG TERM GOAL #1   Title  Pt will have 1 grade improvement throughout BLE MMT in order to further maximize gait, balance, and promote return to PLOF.    Time  8    Period  Weeks    Status  New    Target Date  02/18/18      PT LONG TERM GOAL #2   Title  Pt will be able to perform R SLS for 10 sec or > with no UE assist in order to further demo improved balance, functional strength and maximize overall function.    Time  8    Period  Weeks    Status  New      PT LONG TERM GOAL #3   Title  Pt will be able to perform 5xSTS in 10 sec or < with no UE support and proper mechanics to demo improved balance and functional BLE strength.    Time  8    Period  Weeks    Status  New      PT LONG TERM GOAL #4   Title  Pt will be able to perform the TUG in 12 sec or < with LRAD in order to demo improved balance and decrease her risk for falls.    Time  8    Period  Weeks    Status  New      PT LONG TERM GOAL #5   Title  Pt will be able to ambulate at least 647ft or > during the 3MWT with LRAD and without increases in pain to demo improved tolerance to WB and maximize her ability to return to work once cleared by MD to do so.    Time  8    Period  Weeks  Status  New            Plan - 01/07/18 1149    Clinical Impression Statement  AROM of Rt knee now 6-130 degrees (LT is 5-130) so focus now with shift toward strengthening and stability.  PRogressed with repeated step ups this session with noted weakness and cues to complete with upright posturing.   No pain or discomfort noted, just weakness.  Worked on posterior musculature today with noted ahdesions posteriolateral hamstring and down into calf region.  Encouraged pateint to complete hamstring stretch more at home as well as gastroc stetch.  Instructed  with alternative ways to complete longsitting and standing.     Rehab Potential  Good    PT Frequency  2x / week    PT Duration  8 weeks    PT Treatment/Interventions  ADLs/Self Care Home Management;Biofeedback;Cryotherapy;Electrical Stimulation;Moist Heat;Ultrasound;DME Instruction;Gait training;Stair training;Functional mobility training;Therapeutic activities;Therapeutic exercise;Balance training;Neuromuscular re-education;Patient/family education;Orthotic Fit/Training;Manual techniques;Scar mobilization;Passive range of motion;Dry needling;Energy conservation;Taping    PT Next Visit Plan  PRogress LE and functional strengthening, gait with LRAD.  Complete balance training as tolerated.  Next session begin SLS and tandem stance, progress balance and stabiltiy.     PT Home Exercise Plan  eval: quad sets, heel slides    Consulted and Agree with Plan of Care  Patient       Patient will benefit from skilled therapeutic intervention in order to improve the following deficits and impairments:  Abnormal gait, Decreased activity tolerance, Decreased balance, Decreased endurance, Decreased mobility, Decreased range of motion, Decreased strength, Difficulty walking, Hypomobility, Increased fascial restricitons, Increased muscle spasms, Impaired flexibility, Improper body mechanics, Impaired UE functional use, Postural dysfunction, Pain  Visit Diagnosis: Difficulty in walking, not elsewhere classified  Muscle weakness (generalized)  Chronic pain of right knee     Problem List There are no active problems to display for this patient.  Teena Irani, PTA/CLT [phone]  Teena Irani 01/07/2018, 11:57 AM  [place], [place], [address] Phone: [phone]   Fax:  [phone]  Name: TANAIRY PAYEUR MRN: [id number] Date of Birth: Aug 04, 1956

## 2018-01-07 NOTE — Patient Instructions (Signed)
  Complete all exercises 10 times each , 2-3 times per day.   SHOULDER: Flexion On Table   Place hands on table, elbows straight. Slide arms across table. Hold ___ seconds. ___ reps per set, ___ sets per day, ___ days per week  Abduction (Passive)   With arm out to side, palm down, slide arm across table. Hold ____ seconds. Repeat ____ times. Do ____ sessions per day.  Copyright  [employer]. All rights reserved.     Internal Rotation (Assistive)   Seated with elbow bent at right angle and held against side, slide arm on table surface in an inward arc. Repeat ____ times. Do ____ sessions per day. Activity: Use this motion to brush crumbs off the table.  Copyright  VHI. All rights reserved.   Perform each exercise ________ reps. 2-3x days.   Protraction - STANDING  Start by holding a wand or cane at chest height.  Next, slowly push the wand outwards in front of your body so that your elbows become fully straightened. Then, return to the original position.     Shoulder FLEXION - STANDING - PALMS UP  In the standing position, hold a wand/cane with both arms, palms up on both sides. Raise up the wand/cane allowing your unaffected arm to perform most of the effort. Your affected arm should be partially relaxed.      Internal/External ROTATION - STANDING  In the standing position, hold a wand/cane with both hands keeping your elbows bent. Move your arms and wand/cane to one side.  Your affected arm should be partially relaxed while your unaffected arm performs most of the effort.       Shoulder ABDUCTION - STANDING  While holding a wand/cane palm face up on the injured side and palm face down on the uninjured side, slowly raise up your injured arm to the side.                     Horizontal Abduction/Adduction      Straight arms holding cane at shoulder height, bring cane to right, center, left. Repeat starting to left.   Copyright  VHI. All rights  reserved.
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
## 2018-01-08 ENCOUNTER — Ambulatory Visit
Admission: RE | Admit: 2018-01-08 | Discharge: 2018-01-08 | Disposition: A | Discharge status: Home / Self Care | Payer: 59 | Source: Ambulatory Visit | Attending: Obstetrics and Gynecology | Admitting: Obstetrics and Gynecology

## 2018-01-08 ENCOUNTER — Other Ambulatory Visit: Payer: Self-pay | Admitting: Obstetrics and Gynecology

## 2018-01-08 DIAGNOSIS — N6310 Unspecified lump in the right breast, unspecified quadrant: Secondary | ICD-10-CM

## 2018-01-13 ENCOUNTER — Ambulatory Visit (HOSPITAL_COMMUNITY): Payer: 59 | Admitting: Physical Therapy

## 2018-01-13 DIAGNOSIS — M25511 Pain in right shoulder: Secondary | ICD-10-CM

## 2018-01-13 DIAGNOSIS — R262 Difficulty in walking, not elsewhere classified: Secondary | ICD-10-CM

## 2018-01-13 DIAGNOSIS — M25611 Stiffness of right shoulder, not elsewhere classified: Secondary | ICD-10-CM

## 2018-01-13 DIAGNOSIS — R29898 Other symptoms and signs involving the musculoskeletal system: Secondary | ICD-10-CM

## 2018-01-13 NOTE — Therapy (Signed)
[place], [place], [address] Phone: [phone]   Fax:  [phone]  Physical Therapy Treatment  Patient Details  Name: Rachel Huber MRN: [id number] Date of Birth: October 16, 1955 Referring Provider: Dr.  Richards   Encounter Date: 01/13/2018  PT End of Session - 01/13/18 1604    Visit Number  5    Number of Visits  17    Date for PT Re-Evaluation  02/18/18 01/21/18    Authorization Type  [employer] (will update pt's personal health insurance info once obtained and include any visit limitations, etc.)    Authorization Time Period  12/24/17 to 02/18/18    PT Start Time  1436    PT Stop Time  1518    PT Time Calculation (min)  42 min    Activity Tolerance  Patient tolerated treatment well    Behavior During Therapy  [facility] for tasks assessed/performed       Past Medical History:  Diagnosis Date  . Anxiety   . Arthritis   . Diabetes mellitus without complication ([place])   . Hepatitis    PAST  . Hypertension   . Neuropathy   . Sinus complaint   . Sleep apnea    CPAP  . Thalassemia minor     Past Surgical History:  Procedure Laterality Date  . ABDOMINAL HYSTERECTOMY    . CATARACT EXTRACTION W/PHACO Left 06/11/2016   Procedure: CATARACT EXTRACTION PHACO AND INTRAOCULAR LENS PLACEMENT (IOC);  Surgeon: Estill Cotta, MD;  Location: [facility] ORS;  Service: Ophthalmology;  Laterality: Left;  Lot # [id number] H US:01:38.6 AP%:26.4 CDE:44.15  . COLONOSCOPIES    . CTR      There were no vitals filed for this visit.  Subjective Assessment - 01/13/18 1441    Subjective  Pt reports minimal pain in her LE at 2/10.     Currently in Pain?  Yes    Pain Score  2     Pain Location  Leg    Pain Orientation  Right    Pain Descriptors / Indicators  Aching    Pain Type  Acute pain                       [facility] Adult PT Treatment/Exercise - 01/13/18 0001      Knee/Hip Exercises: Stretches   Passive  Hamstring Stretch  Right;3 reps;30 seconds;Limitations    Passive Hamstring Stretch Limitations  onto 12" step    Gastroc Stretch  Both;3 reps;30 seconds;Limitations    Gastroc Stretch Limitations  slant board      Knee/Hip Exercises: Standing   Heel Raises  15 reps;Limitations    Heel Raises Limitations  no UE's, toeraises 15 reps    Forward Lunges  Both;Limitations;15 reps    Forward Lunges Limitations  no step no UE's    Hip Abduction  Both;20 reps    Hip Extension  Both;20 reps    Stairs  6" step 3RT with SPC step to gait    SLS  bilaterally 5X max of 15" Lt, 4" Rt    Gait Training  226 feet without AD    Other Standing Knee Exercises  tandem stance 30" X 2 each               PT Short Term Goals - 12/24/17 1123      PT SHORT TERM GOAL #1   Title  Pt will be independent wtih HEP and perform  consistently in order to decrease pain and improve overall function.    Time  4    Period  Weeks    Status  New    Target Date  01/21/18      PT SHORT TERM GOAL #2   Title  Pt will have improved R knee ROM from 0-120deg without pain at end range to maximize gait.    Time  4    Period  Weeks    Status  New      PT SHORT TERM GOAL #3   Title  Pt will have 1/2 grade improvement throughout BLE MMT in order to maximize gait, balance, and allow pt to complete functional tasks with greater ease.    Time  4    Period  Weeks    Status  New      PT SHORT TERM GOAL #4   Title  Pt will be able to perform R SLS for 5 sec with no UE assist to demo improved balance and functional strengh in order to maximize gait and stair ambulation.    Time  4    Period  Weeks    Status  New        PT Long Term Goals - 12/24/17 1136      PT LONG TERM GOAL #1   Title  Pt will have 1 grade improvement throughout BLE MMT in order to further maximize gait, balance, and promote return to PLOF.    Time  8    Period  Weeks    Status  New    Target Date  02/18/18      PT LONG TERM GOAL #2   Title   Pt will be able to perform R SLS for 10 sec or > with no UE assist in order to further demo improved balance, functional strength and maximize overall function.    Time  8    Period  Weeks    Status  New      PT LONG TERM GOAL #3   Title  Pt will be able to perform 5xSTS in 10 sec or < with no UE support and proper mechanics to demo improved balance and functional BLE strength.    Time  8    Period  Weeks    Status  New      PT LONG TERM GOAL #4   Title  Pt will be able to perform the TUG in 12 sec or < with LRAD in order to demo improved balance and decrease her risk for falls.    Time  8    Period  Weeks    Status  New      PT LONG TERM GOAL #5   Title  Pt will be able to ambulate at least 684ft or > during the 3MWT with LRAD and without increases in pain to demo improved tolerance to WB and maximize her ability to return to work once cleared by MD to do so.    Time  8    Period  Weeks    Status  New            Plan - 01/13/18 1605    Clinical Impression Statement  Continued with shifted focus toward strengthening and stability.  Began tandem and SLS activities today wtih noted weakness in Rt compared to Lt.  Pt able to complete stairs without cues and with improved overall form with all exercises today.  Decreased UE assistance with all  exercises and decreased lunge to floor today.   No increase in pain or difficulty with therex.     Rehab Potential  Good    PT Frequency  2x / week    PT Duration  8 weeks    PT Treatment/Interventions  ADLs/Self Care Home Management;Biofeedback;Cryotherapy;Electrical Stimulation;Moist Heat;Ultrasound;DME Instruction;Gait training;Stair training;Functional mobility training;Therapeutic activities;Therapeutic exercise;Balance training;Neuromuscular re-education;Patient/family education;Orthotic Fit/Training;Manual techniques;Scar mobilization;Passive range of motion;Dry needling;Energy conservation;Taping    PT Next Visit Plan  PRogress LE and  functional strengthening, gait with LRAD.  Complete balance training and stability as tolerated.      PT Home Exercise Plan  eval: quad sets, heel slides    Consulted and Agree with Plan of Care  Patient       Patient will benefit from skilled therapeutic intervention in order to improve the following deficits and impairments:  Abnormal gait, Decreased activity tolerance, Decreased balance, Decreased endurance, Decreased mobility, Decreased range of motion, Decreased strength, Difficulty walking, Hypomobility, Increased fascial restricitons, Increased muscle spasms, Impaired flexibility, Improper body mechanics, Impaired UE functional use, Postural dysfunction, Pain  Visit Diagnosis: Stiffness of right shoulder, not elsewhere classified  Acute pain of right shoulder  Other symptoms and signs involving the musculoskeletal system  Difficulty in walking, not elsewhere classified     Problem List There are no active problems to display for this patient.  Teena Irani, PTA/CLT [phone]  Teena Irani 01/13/2018, 4:11 PM  [facility] [address] [place], [place], [address] Phone: [phone]   Fax:  [phone]  Name: Rachel Huber MRN: [id number] Date of Birth: 02-14-1956

## 2018-01-14 ENCOUNTER — Ambulatory Visit (HOSPITAL_COMMUNITY): Payer: 59 | Admitting: Physical Therapy

## 2018-01-14 ENCOUNTER — Encounter (HOSPITAL_COMMUNITY): Payer: Self-pay

## 2018-01-14 ENCOUNTER — Ambulatory Visit (HOSPITAL_COMMUNITY): Payer: 59

## 2018-01-14 ENCOUNTER — Other Ambulatory Visit: Payer: Self-pay

## 2018-01-14 ENCOUNTER — Encounter (HOSPITAL_COMMUNITY): Payer: Self-pay | Admitting: Physical Therapy

## 2018-01-14 DIAGNOSIS — M6281 Muscle weakness (generalized): Secondary | ICD-10-CM

## 2018-01-14 DIAGNOSIS — M25611 Stiffness of right shoulder, not elsewhere classified: Secondary | ICD-10-CM

## 2018-01-14 DIAGNOSIS — R262 Difficulty in walking, not elsewhere classified: Secondary | ICD-10-CM

## 2018-01-14 DIAGNOSIS — G8929 Other chronic pain: Secondary | ICD-10-CM

## 2018-01-14 DIAGNOSIS — R29898 Other symptoms and signs involving the musculoskeletal system: Secondary | ICD-10-CM

## 2018-01-14 DIAGNOSIS — M25561 Pain in right knee: Secondary | ICD-10-CM

## 2018-01-14 DIAGNOSIS — M25511 Pain in right shoulder: Secondary | ICD-10-CM

## 2018-01-14 DIAGNOSIS — M25571 Pain in right ankle and joints of right foot: Secondary | ICD-10-CM

## 2018-01-14 NOTE — Therapy (Signed)
[place], [place], [address] Phone: [phone]   Fax:  [phone]  Physical Therapy Treatment  Patient Details  Name: Rachel Huber MRN: [id number] Date of Birth: Oct 11, 1955 Referring Provider: Dr. Frazier Richards   Encounter Date: 01/14/2018  PT End of Session - 01/14/18 1052    Visit Number  6    Number of Visits  17    Date for PT Re-Evaluation  02/18/18 01/21/18    Authorization Type  [employer] (will update pt's personal health insurance info once obtained and include any visit limitations, etc.)    Authorization Time Period  12/24/17 to 02/18/18    PT Start Time  1035    PT Stop Time  1115    PT Time Calculation (min)  40 min    Activity Tolerance  Patient tolerated treatment well    Behavior During Therapy  [facility] for tasks assessed/performed       Past Medical History:  Diagnosis Date  . Anxiety   . Arthritis   . Diabetes mellitus without complication ([place])   . Hepatitis    PAST  . Hypertension   . Neuropathy   . Sinus complaint   . Sleep apnea    CPAP  . Thalassemia minor     Past Surgical History:  Procedure Laterality Date  . ABDOMINAL HYSTERECTOMY    . CATARACT EXTRACTION W/PHACO Left 06/11/2016   Procedure: CATARACT EXTRACTION PHACO AND INTRAOCULAR LENS PLACEMENT (IOC);  Surgeon: Estill Cotta, MD;  Location: [facility] ORS;  Service: Ophthalmology;  Laterality: Left;  Lot # [id number] H US:01:38.6 AP%:26.4 CDE:44.15  . COLONOSCOPIES    . CTR      There were no vitals filed for this visit.  Subjective Assessment - 01/14/18 1029    Subjective  [name] states that she has not been doing anythng for balance at home.   She is stiff in her legs until she is on her legs too long     Currently in Pain?  No/denies 1/10 for shoulder just discomfort     Pain Onset  More than a month ago                [facility] Adult PT Treatment/Exercise - 01/14/18 1043      Ambulation/Gait   Ambulation Distance (Feet)  --    Assistive device  --      Exercises   Exercises  Knee/Hip      Knee/Hip Exercises: Stretches   Passive Hamstring Stretch  Right;3 reps;30 seconds;Limitations    Passive Hamstring Stretch Limitations  onto 12" step    Gastroc Stretch  Both;3 reps;30 seconds;Limitations    Gastroc Stretch Limitations  slant board      Knee/Hip Exercises: Standing   Heel Raises  --    Heel Raises Limitations  --    Forward Lunges  Both;Limitations;15 reps    Forward Lunges Limitations  onto bosu     Hip Abduction  --    Hip Extension  --    Forward Step Up  Both;10 reps;Hand Hold: 2;Step Height: 6"    Stairs  --    SLS  --    Gait Training  226 feet without AD    Other Standing Knee Exercises  tandem stance 30" X 2 each          Balance Exercises - 01/14/18 1044      Balance Exercises: Standing   Tandem Stance  Eyes open;3 reps  SLS  Eyes open;5 reps    Sidestepping  2 reps;Theraband red at mat     Marching Limitations  10    Heel Raises Limitations  15 combined with squat    Sit to Stand Time  10 elevated mat          PT Short Term Goals - 12/24/17 1123      PT SHORT TERM GOAL #1   Title  Pt will be independent wtih HEP and perform consistently in order to decrease pain and improve overall function.    Time  4    Period  Weeks    Status  New    Target Date  01/21/18      PT SHORT TERM GOAL #2   Title  Pt will have improved R knee ROM from 0-120deg without pain at end range to maximize gait.    Time  4    Period  Weeks    Status  New      PT SHORT TERM GOAL #3   Title  Pt will have 1/2 grade improvement throughout BLE MMT in order to maximize gait, balance, and allow pt to complete functional tasks with greater ease.    Time  4    Period  Weeks    Status  New      PT SHORT TERM GOAL #4   Title  Pt will be able to perform R SLS for 5 sec with no UE assist to demo improved balance and functional strengh in order to maximize gait and  stair ambulation.    Time  4    Period  Weeks    Status  New        PT Long Term Goals - 12/24/17 1136      PT LONG TERM GOAL #1   Title  Pt will have 1 grade improvement throughout BLE MMT in order to further maximize gait, balance, and promote return to PLOF.    Time  8    Period  Weeks    Status  New    Target Date  02/18/18      PT LONG TERM GOAL #2   Title  Pt will be able to perform R SLS for 10 sec or > with no UE assist in order to further demo improved balance, functional strength and maximize overall function.    Time  8    Period  Weeks    Status  New      PT LONG TERM GOAL #3   Title  Pt will be able to perform 5xSTS in 10 sec or < with no UE support and proper mechanics to demo improved balance and functional BLE strength.    Time  8    Period  Weeks    Status  New      PT LONG TERM GOAL #4   Title  Pt will be able to perform the TUG in 12 sec or < with LRAD in order to demo improved balance and decrease her risk for falls.    Time  8    Period  Weeks    Status  New      PT LONG TERM GOAL #5   Title  Pt will be able to ambulate at least 618f or > during the 3MWT with LRAD and without increases in pain to demo improved tolerance to WB and maximize her ability to return to work once cleared by MD to do so.  Time  8    Period  Weeks    Status  New            Plan - 01/14/18 1053    Clinical Impression Statement  Added squats with heelraises, added marching and tandem stance for improved baance.  Increased lunging difficulty by using bosu.  PT needed rest break during treatment.      Rehab Potential  Good    PT Frequency  2x / week    PT Duration  8 weeks    PT Treatment/Interventions  ADLs/Self Care Home Management;Biofeedback;Cryotherapy;Electrical Stimulation;Moist Heat;Ultrasound;DME Instruction;Gait training;Stair training;Functional mobility training;Therapeutic activities;Therapeutic exercise;Balance training;Neuromuscular  re-education;Patient/family education;Orthotic Fit/Training;Manual techniques;Scar mobilization;Passive range of motion;Dry needling;Energy conservation;Taping    PT Next Visit Plan  PRogress LE and functional strengthening, gait with LRAD.  Complete balance training and stability as tolerated.      PT Home Exercise Plan  eval: quad sets, heel slides    Consulted and Agree with Plan of Care  Patient       Patient will benefit from skilled therapeutic intervention in order to improve the following deficits and impairments:  Abnormal gait, Decreased activity tolerance, Decreased balance, Decreased endurance, Decreased mobility, Decreased range of motion, Decreased strength, Difficulty walking, Hypomobility, Increased fascial restricitons, Increased muscle spasms, Impaired flexibility, Improper body mechanics, Impaired UE functional use, Postural dysfunction, Pain  Visit Diagnosis: Stiffness of right shoulder, not elsewhere classified  Difficulty in walking, not elsewhere classified  Muscle weakness (generalized)  Chronic pain of right knee  Pain in right ankle and joints of right foot     Problem List There are no active problems to display for this patient.   Rayetta Humphrey, [place] CLT [phone] 01/14/2018, 11:20 AM  [place], [place], [address] Phone: [phone]   Fax:  [phone]  Name: JAZSMIN COUSE MRN: [id number] Date of Birth: 1956/05/01

## 2018-01-14 NOTE — Therapy (Signed)
[place], [place], [address] Phone: [phone]   Fax:  [phone]  Occupational Therapy Treatment  Patient Details  Name: Rachel Huber MRN: [id number] Date of Birth: 1955-08-21 Referring Provider: Dr. Frazier Richards   Encounter Date: 01/14/2018  OT End of Session - 01/14/18 1001    Visit Number  2    Number of Visits  16    Date for OT Re-Evaluation  03/08/18    Authorization Type  [employer] - following CPT codes allowed:  eval, self care, manual therapy, therapeutic exercise    Authorization - Visit Number  2    Authorization - Number of Visits  61    OT Start Time  0945    OT Stop Time  1032    OT Time Calculation (min)  47 min    Behavior During Therapy  [facility] for tasks assessed/performed       Past Medical History:  Diagnosis Date  . Anxiety   . Arthritis   . Diabetes mellitus without complication ([place])   . Hepatitis    PAST  . Hypertension   . Neuropathy   . Sinus complaint   . Sleep apnea    CPAP  . Thalassemia minor     Past Surgical History:  Procedure Laterality Date  . ABDOMINAL HYSTERECTOMY    . CATARACT EXTRACTION W/PHACO Left 06/11/2016   Procedure: CATARACT EXTRACTION PHACO AND INTRAOCULAR LENS PLACEMENT (IOC);  Surgeon: Estill Cotta, MD;  Location: [facility] ORS;  Service: Ophthalmology;  Laterality: Left;  Lot # [id number] H US:01:38.6 AP%:26.4 CDE:44.15  . COLONOSCOPIES    . CTR      There were no vitals filed for this visit.  Subjective Assessment - 01/14/18 0948    Subjective   S: When I went to put my jeans on this morning my arm hurt so bad I thought it was going to fall off.    Currently in Pain?  Yes    Pain Score  3     Pain Location  Arm    Pain Orientation  Right    Pain Descriptors / Indicators  Aching    Pain Type  Chronic pain    Pain Onset  More than a month ago    Pain Frequency  Constant    Aggravating Factors   use, pulling objects together, quick  movements    Pain Relieving Factors  rest, biofreeze, ice packs, pain medication    Effect of Pain on Daily Activities  moderate effect on ADLs    Multiple Pain Sites  No         [facility] OT Assessment - 01/14/18 1002      Assessment   Medical Diagnosis  S/P Right Total Shoulder Replacement      Precautions   Precautions  None               OT Treatments/Exercises (OP) - 01/14/18 1003      Exercises   Exercises  Shoulder      Shoulder Exercises: Supine   Protraction  PROM;5 reps;AAROM;10 reps    Horizontal ABduction  PROM;5 reps;AAROM;10 reps    External Rotation  PROM;5 reps;AAROM;10 reps    Internal Rotation  PROM;5 reps;AAROM;10 reps    Flexion  PROM;5 reps;AAROM;10 reps    ABduction  PROM;5 reps;AAROM;10 reps      Manual Therapy   Manual Therapy  Myofascial release    Manual therapy comments  completed seperately from all  other skilled interventions    Myofascial Release  myofascial release and manual stretching to right upper arm, scapular, shoulder region  and scar region to decrease fascial restrictions and improve pain free mobility in right arm and shoulder          Balance Exercises - 01/14/18 1044      Balance Exercises: Standing   Tandem Stance  Eyes open;3 reps    SLS  Eyes open;5 reps    Sidestepping  2 reps;Theraband red at mat     Marching Limitations  10    Heel Raises Limitations  15 combined with squat    Sit to Stand Time  10 elevated mat        OT Education - 01/14/18 1001    Education provided  Yes    Education Details  Therapist reviewed evaluation from last session and went over goals.    Person(s) Educated  Patient    Methods  Explanation;Handout    Comprehension  Verbalized understanding       OT Short Term Goals - 01/14/18 1005      OT SHORT TERM GOAL #1   Title  Patient will be educated on HEP for improved RUE functional use.    Time  4    Period  Weeks    Status  On-going      OT SHORT TERM GOAL #2   Title   Patient will improve right shoulder P/ROM to [facility] in order to improve ease of donning and doffing clothing.     Time  4    Period  Weeks    Status  On-going      OT SHORT TERM GOAL #3   Title  Patient will increase right shoulder strength to 4/5 in order to improve ability to lift bags of groceries.     Time  4    Period  Weeks    Status  On-going      OT SHORT TERM GOAL #4   Title  Patient will decrease fascial and scar restrictions from moderate to min moderate for improved mobility needed for increased participation in work tasks.     Time  4    Period  Weeks    Status  On-going      OT SHORT TERM GOAL #5   Title  Patient will decrease pain to 3/10 in right arm and shoulder during ADL completion.     Time  4    Period  Weeks    Status  On-going        OT Long Term Goals - 01/14/18 1005      OT LONG TERM GOAL #1   Title  Patient will use her right arm as dominant with all B/IADLs, work, leisure, and driving tasks.     Time  8    Period  Weeks    Status  On-going      OT LONG TERM GOAL #2   Title  Patient will increase right shoulder A/ROM to [facility] in order to reach behind back to fasten bra and reach overhead with out difficulty.     Time  8    Period  Weeks    Status  On-going      OT LONG TERM GOAL #3   Title  Patient will increase right shoulder strength to 5/5 in order to lift items at work and when completing leisure activities.     Time  8    Period  Weeks  Status  On-going      OT LONG TERM GOAL #4   Title  Patient will decrease pain to 2/10 or less in her right shoulder when reaching overhead.    Time  8    Period  Weeks    Status  On-going      OT LONG TERM GOAL #5   Title  Patient will decrese fascial restrictions to minimal in order to improve moblity needed to complete daily activities without pain.     Time  8    Period  Weeks    Status  On-going            Plan - 01/14/18 1005    Clinical Impression Statement  A:  Pt reports pain in  right upper arm. Moderate fascial restrictions palpated in upper arm and upper trapezius region. Pt reports she has been doing her HEP and is having the most difficulty with the overhead stretching. Pt reports one of her main concerns is driving and wishes to return to this activity as soon as possible. 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 Therapist reviewed evaluation and goals with patient. Printout given to pt. Pt completed AA/ROM supine this session with minimal VC needed for form and technique.     Occupational Profile and client history currently impacting functional performance  age, motivation    Occupational performance deficits (Please refer to evaluation for details):  ADL's;IADL's;Rest and Sleep;Work;Leisure;Social Participation    Rehab Potential  Good    Current Impairments/barriers affecting progress:  physical activity level    OT Frequency  2x / week    OT Duration  8 weeks    OT Treatment/Interventions  Self-care/ADL training;Electrical Stimulation;Therapeutic exercise;Patient/family education;Neuromuscular education;Moist Heat;Energy conservation;Scar mobilization;Therapeutic activities;Passive range of motion;Manual Therapy;DME and/or AE instruction;Contrast Bath;Ultrasound;Cryotherapy    Plan  P:  Continue with manual therapy to address fascial restrictions in right upper arm, scapular, and upper trapezius region. Progress to AA/ROM seated/standing and update HEP accordingly.     Clinical Decision Making  Several treatment options, min-mod task modification necessary    OT Home Exercise Plan  --    Consulted and Agree with Plan of Care  Patient       Patient will benefit from skilled therapeutic intervention in order to improve the following deficits and impairments:  Pain, Decreased scar mobility, Increased fascial restrictions, Increased muscle spasms, Decreased strength,  Decreased range of motion, Impaired UE functional use  Visit Diagnosis: Stiffness of right shoulder, not elsewhere classified  Acute pain of right shoulder  Other symptoms and signs involving the musculoskeletal system    Problem List There are no active problems to display for this patient.   Roderic Palau, OT student 01/14/2018, 11:44 AM  [place], [place], [address] Phone: [phone]   Fax:  [phone]  Name: Rachel Huber MRN: [id number] Date of Birth: March 11, 1956

## 2018-01-15 ENCOUNTER — Other Ambulatory Visit: Payer: Self-pay | Admitting: Obstetrics and Gynecology

## 2018-01-15 ENCOUNTER — Encounter (HOSPITAL_COMMUNITY): Payer: Managed Care, Other (non HMO)

## 2018-01-15 DIAGNOSIS — N6310 Unspecified lump in the right breast, unspecified quadrant: Secondary | ICD-10-CM

## 2018-01-15 DIAGNOSIS — R928 Other abnormal and inconclusive findings on diagnostic imaging of breast: Secondary | ICD-10-CM

## 2018-01-20 ENCOUNTER — Ambulatory Visit (HOSPITAL_COMMUNITY): Payer: 59 | Attending: Internal Medicine

## 2018-01-20 ENCOUNTER — Ambulatory Visit (HOSPITAL_COMMUNITY): Payer: 59 | Admitting: Occupational Therapy

## 2018-01-20 ENCOUNTER — Encounter (HOSPITAL_COMMUNITY): Payer: Self-pay

## 2018-01-20 ENCOUNTER — Encounter (HOSPITAL_COMMUNITY): Payer: Self-pay | Admitting: Occupational Therapy

## 2018-01-20 DIAGNOSIS — G8929 Other chronic pain: Secondary | ICD-10-CM | POA: Diagnosis present

## 2018-01-20 DIAGNOSIS — M25511 Pain in right shoulder: Secondary | ICD-10-CM | POA: Insufficient documentation

## 2018-01-20 DIAGNOSIS — R262 Difficulty in walking, not elsewhere classified: Secondary | ICD-10-CM | POA: Diagnosis present

## 2018-01-20 DIAGNOSIS — M25611 Stiffness of right shoulder, not elsewhere classified: Secondary | ICD-10-CM

## 2018-01-20 DIAGNOSIS — R29898 Other symptoms and signs involving the musculoskeletal system: Secondary | ICD-10-CM | POA: Diagnosis present

## 2018-01-20 DIAGNOSIS — M6281 Muscle weakness (generalized): Secondary | ICD-10-CM | POA: Diagnosis present

## 2018-01-20 DIAGNOSIS — M25571 Pain in right ankle and joints of right foot: Secondary | ICD-10-CM | POA: Diagnosis present

## 2018-01-20 DIAGNOSIS — M25561 Pain in right knee: Secondary | ICD-10-CM | POA: Insufficient documentation

## 2018-01-20 NOTE — Therapy (Signed)
[place], [place], [address] Phone: [phone]   Fax:  [phone]  Occupational Therapy Treatment  Patient Details  Name: Rachel Huber MRN: [id number] Date of Birth: [birth year] Referring Provider: Dr. Frazier Richards   Encounter Date: 01/20/2018  OT End of Session - 01/20/18 1521    Visit Number  3    Number of Visits  16    Date for OT Re-Evaluation  03/08/18    Authorization Type  [employer] - following CPT codes allowed:  eval, self care, manual therapy, therapeutic exercise    Authorization - Visit Number  3    Authorization - Number of Visits  83    OT Start Time  1660    OT Stop Time  1515    OT Time Calculation (min)  42 min    Behavior During Therapy  [facility] for tasks assessed/performed       Past Medical History:  Diagnosis Date  . Anxiety   . Arthritis   . Diabetes mellitus without complication ([place])   . Hepatitis    PAST  . Hypertension   . Neuropathy   . Sinus complaint   . Sleep apnea    CPAP  . Thalassemia minor     Past Surgical History:  Procedure Laterality Date  . ABDOMINAL HYSTERECTOMY    . CATARACT EXTRACTION W/PHACO Left 06/11/2016   Procedure: CATARACT EXTRACTION PHACO AND INTRAOCULAR LENS PLACEMENT (IOC);  Surgeon: Estill Cotta, MD;  Location: [facility] ORS;  Service: Ophthalmology;  Laterality: Left;  Lot # [id number] H US:01:38.6 AP%:26.4 CDE:44.15  . COLONOSCOPIES    . CTR      There were no vitals filed for this visit.  Subjective Assessment - 01/20/18 1437    Subjective   S: When can I drive?     Currently in Pain?  No/denies pt took Advil prior to arrival         [facility] OT Assessment - 01/20/18 1437      Assessment   Medical Diagnosis  S/P Right Total Shoulder Replacement      Precautions   Precautions  None    Precaution Comments  1               OT Treatments/Exercises (OP) - 01/20/18 1438      Exercises   Exercises  Shoulder      Shoulder  Exercises: Supine   Protraction  PROM;5 reps;AAROM;10 reps    Horizontal ABduction  PROM;5 reps;AAROM;10 reps    External Rotation  PROM;5 reps;AAROM;10 reps    Internal Rotation  PROM;5 reps;AAROM;10 reps    Flexion  PROM;5 reps;AAROM;10 reps    ABduction  PROM;5 reps;AAROM;10 reps      Shoulder Exercises: Seated   Protraction  AAROM;10 reps    Horizontal ABduction  AAROM;10 reps    External Rotation  AAROM;10 reps    Flexion  AAROM;10 reps    Abduction  AAROM;10 reps      Manual Therapy   Manual Therapy  Myofascial release    Manual therapy comments  completed seperately from all other skilled interventions    Myofascial Release  myofascial release and manual stretching to right upper arm, scapular, shoulder region  and scar region to decrease fascial restrictions and improve pain free mobility in right arm and shoulder               OT Short Term Goals - 01/14/18 1005      OT  SHORT TERM GOAL #1   Title  Patient will be educated on HEP for improved RUE functional use.    Time  4    Period  Weeks    Status  On-going      OT SHORT TERM GOAL #2   Title  Patient will improve right shoulder P/ROM to [facility] in order to improve ease of donning and doffing clothing.     Time  4    Period  Weeks    Status  On-going      OT SHORT TERM GOAL #3   Title  Patient will increase right shoulder strength to 4/5 in order to improve ability to lift bags of groceries.     Time  4    Period  Weeks    Status  On-going      OT SHORT TERM GOAL #4   Title  Patient will decrease fascial and scar restrictions from moderate to min moderate for improved mobility needed for increased participation in work tasks.     Time  4    Period  Weeks    Status  On-going      OT SHORT TERM GOAL #5   Title  Patient will decrease pain to 3/10 in right arm and shoulder during ADL completion.     Time  4    Period  Weeks    Status  On-going        OT Long Term Goals - 01/14/18 1005      OT LONG  TERM GOAL #1   Title  Patient will use her right arm as dominant with all B/IADLs, work, leisure, and driving tasks.     Time  8    Period  Weeks    Status  On-going      OT LONG TERM GOAL #2   Title  Patient will increase right shoulder A/ROM to [facility] in order to reach behind back to fasten bra and reach overhead with out difficulty.     Time  8    Period  Weeks    Status  On-going      OT LONG TERM GOAL #3   Title  Patient will increase right shoulder strength to 5/5 in order to lift items at work and when completing leisure activities.     Time  8    Period  Weeks    Status  On-going      OT LONG TERM GOAL #4   Title  Patient will decrease pain to 2/10 or less in her right shoulder when reaching overhead.    Time  8    Period  Weeks    Status  On-going      OT LONG TERM GOAL #5   Title  Patient will decrese fascial restrictions to minimal in order to improve moblity needed to complete daily activities without pain.     Time  8    Period  Weeks    Status  On-going            Plan - 01/20/18 1521    Clinical Impression Statement  A: Manual therapy continued to address fascial restrictions in upper arm, deltoid, and trapezius regions limiting ROM. Pt has been attempting to reaching towards the top of her refridgerator at home. Progressed to seated AA/ROM today, pt achieving ROM WFL. Instructed pt to begin seated AA/ROM at home as well. Verbal cuing for form and technique during session.     Plan  P:  Continue with manual therapy working on fascial restrictionsm, add pulleys and wall wash       Patient will benefit from skilled therapeutic intervention in order to improve the following deficits and impairments:  Pain, Decreased scar mobility, Increased fascial restrictions, Increased muscle spasms, Decreased strength, Decreased range of motion, Impaired UE functional use  Visit Diagnosis: Stiffness of right shoulder, not elsewhere classified  Acute pain of right  shoulder  Other symptoms and signs involving the musculoskeletal system    Problem List There are no active problems to display for this patient.  Guadelupe Sabin, OTR/L  [phone] 01/20/2018, 3:24 PM  [place] [address] [place], [place], [address] Phone: [phone]   Fax:  [phone]  Name: MAYTHE DERAMO MRN: [id number] Date of Birth: 07/19/1956

## 2018-01-20 NOTE — Therapy (Signed)
[place], [place], [address] Phone: [phone]   Fax:  [phone]  Physical Therapy Treatment  Patient Details  Name: Rachel Huber MRN: [id number] Date of Birth: 26-Sep-1955 Referring Provider: Dr. Frazier Richards   Encounter Date: 01/20/2018  PT End of Session - 01/20/18 1524    Visit Number  7    Number of Visits  17    Date for PT Re-Evaluation  02/18/18 Mini-reassess 01/21/18    Authorization Type  [employer] (will update pt's personal health insurance info once obtained and include any visit limitations, etc.)    Authorization Time Period  12/24/17 to 02/18/18    PT Start Time  1520    PT Stop Time  1602    PT Time Calculation (min)  42 min    Activity Tolerance  Patient tolerated treatment well    Behavior During Therapy  [facility] for tasks assessed/performed       Past Medical History:  Diagnosis Date  . Anxiety   . Arthritis   . Diabetes mellitus without complication ([place])   . Hepatitis    PAST  . Hypertension   . Neuropathy   . Sinus complaint   . Sleep apnea    CPAP  . Thalassemia minor     Past Surgical History:  Procedure Laterality Date  . ABDOMINAL HYSTERECTOMY    . CATARACT EXTRACTION W/PHACO Left 06/11/2016   Procedure: CATARACT EXTRACTION PHACO AND INTRAOCULAR LENS PLACEMENT (IOC);  Surgeon: Estill Cotta, MD;  Location: [facility] ORS;  Service: Ophthalmology;  Laterality: Left;  Lot # [id number] H US:01:38.6 AP%:26.4 CDE:44.15  . COLONOSCOPIES    . CTR      There were no vitals filed for this visit.  Subjective Assessment - 01/20/18 1521    Subjective  Pt stated she has some discomfort following last session.  Current pain scale 2/10 achey pain Rt knee.      Patient Stated Goals  get off of the walker    Currently in Pain?  Yes    Pain Score  2     Pain Location  Knee    Pain Orientation  Right    Pain Descriptors / Indicators  Aching    Pain Type  Chronic pain    Pain  Onset  More than a month ago    Pain Frequency  Intermittent    Aggravating Factors   weight bearing, standing/walking    Pain Relieving Factors  rest, biofreeze, ice packs, pain medication    Effect of Pain on Daily Activities  minimal                       [facility] Adult PT Treatment/Exercise - 01/20/18 1528      Knee/Hip Exercises: Standing   Forward Lunges  Both;Limitations;15 reps    Forward Lunges Limitations  onto bosu     Lateral Step Up  Right;10 reps;Hand Hold: 2;Step Height: 6"    Forward Step Up  15 reps;Hand Hold: 2;Step Height: 6"    Functional Squat  15 reps    Stairs  unable to complete 7in step height without compensation      Knee/Hip Exercises: Seated   Sit to Sand  10 reps;without UE support chair height            Balance Exercises - 01/20/18 1607      Balance Exercises: Standing   Tandem Stance  Eyes open;Foam/compliant surface;30 secs last  set with RTB palvo    SLS  Eyes open;5 reps Rt 14", Lt 33" max    SLS with Vectors  Solid surface;3 reps;Intermittent upper extremity assist 3x 5"    Sidestepping  2 reps;Theraband RTB down blue line    Heel Raises Limitations  15 combined with squat          PT Short Term Goals - 12/24/17 1123      PT SHORT TERM GOAL #1   Title  Pt will be independent wtih HEP and perform consistently in order to decrease pain and improve overall function.    Time  4    Period  Weeks    Status  New    Target Date  01/21/18      PT SHORT TERM GOAL #2   Title  Pt will have improved R knee ROM from 0-120deg without pain at end range to maximize gait.    Time  4    Period  Weeks    Status  New      PT SHORT TERM GOAL #3   Title  Pt will have 1/2 grade improvement throughout BLE MMT in order to maximize gait, balance, and allow pt to complete functional tasks with greater ease.    Time  4    Period  Weeks    Status  New      PT SHORT TERM GOAL #4   Title  Pt will be able to perform R SLS for 5 sec with no  UE assist to demo improved balance and functional strengh in order to maximize gait and stair ambulation.    Time  4    Period  Weeks    Status  New        PT Long Term Goals - 12/24/17 1136      PT LONG TERM GOAL #1   Title  Pt will have 1 grade improvement throughout BLE MMT in order to further maximize gait, balance, and promote return to PLOF.    Time  8    Period  Weeks    Status  New    Target Date  02/18/18      PT LONG TERM GOAL #2   Title  Pt will be able to perform R SLS for 10 sec or > with no UE assist in order to further demo improved balance, functional strength and maximize overall function.    Time  8    Period  Weeks    Status  New      PT LONG TERM GOAL #3   Title  Pt will be able to perform 5xSTS in 10 sec or < with no UE support and proper mechanics to demo improved balance and functional BLE strength.    Time  8    Period  Weeks    Status  New      PT LONG TERM GOAL #4   Title  Pt will be able to perform the TUG in 12 sec or < with LRAD in order to demo improved balance and decrease her risk for falls.    Time  8    Period  Weeks    Status  New      PT LONG TERM GOAL #5   Title  Pt will be able to ambulate at least 69f or > during the 3MWT with LRAD and without increases in pain to demo improved tolerance to WB and maximize her ability to return to work once  cleared by MD to do so.    Time  8    Period  Weeks    Status  New            Plan - 01/20/18 1610    Clinical Impression Statement  Added palvo in tandem and vector stance for hip stability and balalnce training.  Pt continues to demonstrate weakness with stair training.  Improved depth with squats and able to complete controlled sit to stand from average chair height.  No reoprts of increased pain through session.      Rehab Potential  Good    PT Frequency  2x / week    PT Duration  8 weeks    PT Treatment/Interventions  ADLs/Self Care Home Management;Biofeedback;Cryotherapy;Electrical  Stimulation;Moist Heat;Ultrasound;DME Instruction;Gait training;Stair training;Functional mobility training;Therapeutic activities;Therapeutic exercise;Balance training;Neuromuscular re-education;Patient/family education;Orthotic Fit/Training;Manual techniques;Scar mobilization;Passive range of motion;Dry needling;Energy conservation;Taping    PT Next Visit Plan  Mini-reassess next session.  Progress LE and functional strengthening, gait with LRAD.  Complete balance training and stability as tolerated.      PT Home Exercise Plan  eval: quad sets, heel slides       Patient will benefit from skilled therapeutic intervention in order to improve the following deficits and impairments:  Abnormal gait, Decreased activity tolerance, Decreased balance, Decreased endurance, Decreased mobility, Decreased range of motion, Decreased strength, Difficulty walking, Hypomobility, Increased fascial restricitons, Increased muscle spasms, Impaired flexibility, Improper body mechanics, Impaired UE functional use, Postural dysfunction, Pain  Visit Diagnosis: Difficulty in walking, not elsewhere classified  Muscle weakness (generalized)  Chronic pain of right knee  Pain in right ankle and joints of right foot     Problem List There are no active problems to display for this patient.  [address] LPTA; [place]  Aldona Lento 01/20/2018, 4:14 PM  [place], [place], [address] Phone: [phone]   Fax:  [phone]  Name: Rachel Huber MRN: [id number] Date of Birth: 1955/12/29

## 2018-01-21 ENCOUNTER — Ambulatory Visit (HOSPITAL_COMMUNITY): Payer: 59 | Admitting: Occupational Therapy

## 2018-01-21 ENCOUNTER — Encounter (HOSPITAL_COMMUNITY): Payer: Self-pay | Admitting: Occupational Therapy

## 2018-01-21 ENCOUNTER — Ambulatory Visit (HOSPITAL_COMMUNITY): Payer: 59

## 2018-01-21 DIAGNOSIS — M25511 Pain in right shoulder: Secondary | ICD-10-CM

## 2018-01-21 DIAGNOSIS — M6281 Muscle weakness (generalized): Secondary | ICD-10-CM

## 2018-01-21 DIAGNOSIS — R262 Difficulty in walking, not elsewhere classified: Secondary | ICD-10-CM | POA: Diagnosis not present

## 2018-01-21 DIAGNOSIS — M25611 Stiffness of right shoulder, not elsewhere classified: Secondary | ICD-10-CM

## 2018-01-21 DIAGNOSIS — M25561 Pain in right knee: Secondary | ICD-10-CM

## 2018-01-21 DIAGNOSIS — M25571 Pain in right ankle and joints of right foot: Secondary | ICD-10-CM

## 2018-01-21 DIAGNOSIS — R29898 Other symptoms and signs involving the musculoskeletal system: Secondary | ICD-10-CM

## 2018-01-21 DIAGNOSIS — G8929 Other chronic pain: Secondary | ICD-10-CM

## 2018-01-21 NOTE — Patient Instructions (Signed)
Access Code: [email or web address]  URL: [email or web address]  Date: 01/21/2018  Prepared by: Geraldine Solar   Exercises Sidelying Hip Abduction - 10 reps - 3 sets - 1x daily - 7x weekly Clamshell with Resistance - 10 reps - 3 sets - 1x daily - 7x weekly

## 2018-01-21 NOTE — Therapy (Signed)
[place], [place], [address] Phone: [phone]   Fax:  [phone]    Progress Note Reporting Period 12/24/17 to 01/21/18  See note below for Objective Data and Assessment of Progress/Goals.     Physical Therapy Treatment  Patient Details  Name: Rachel Huber MRN: [id number] Date of Birth: 09/23/1955 Referring Provider: Dr. Frazier Richards   Encounter Date: 01/21/2018  PT End of Session - 01/21/18 0903    Visit Number  8    Number of Visits  17    Date for PT Re-Evaluation  02/18/18 Mini-reassess completed 01/21/18    Authorization Type  [employer] (will update pt's personal health insurance info once obtained and include any visit limitations, etc.)    Authorization Time Period  12/24/17 to 02/18/18    PT Start Time  0900    PT Stop Time  0940    PT Time Calculation (min)  40 min    Activity Tolerance  Patient tolerated treatment well    Behavior During Therapy  [facility] for tasks assessed/performed       Past Medical History:  Diagnosis Date  . Anxiety   . Arthritis   . Diabetes mellitus without complication ([place])   . Hepatitis    PAST  . Hypertension   . Neuropathy   . Sinus complaint   . Sleep apnea    CPAP  . Thalassemia minor     Past Surgical History:  Procedure Laterality Date  . ABDOMINAL HYSTERECTOMY    . CATARACT EXTRACTION W/PHACO Left 06/11/2016   Procedure: CATARACT EXTRACTION PHACO AND INTRAOCULAR LENS PLACEMENT (IOC);  Surgeon: Estill Cotta, MD;  Location: [facility] ORS;  Service: Ophthalmology;  Laterality: Left;  Lot # [id number] H US:01:38.6 AP%:26.4 CDE:44.15  . COLONOSCOPIES    . CTR      There were no vitals filed for this visit.  Subjective Assessment - 01/21/18 0903    Subjective  Pt reports that she's pretty good today. No knee pain currently. Compliance with HEP reported and feels therapy is giong well.     Patient Stated Goals  get off of the walker    Currently in  Pain?  No/denies    Pain Onset  More than a month ago         [facility] PT Assessment - 01/21/18 0001      ROM / Strength   AROM / PROM / Strength  AROM;Strength      AROM   Right Knee Extension  5 was 8    Right Knee Flexion  123 was 117      Strength   Right Hip Flexion  4+/5 was 4    Right Hip Extension  4+/5 ws 4    Right Hip ABduction  4/5 was 3+    Left Hip Flexion  -- was 4+    Left Hip Extension  4+/5 was 4    Left Hip ABduction  4/5 was 4-    Right Knee Flexion  4+/5 was 4    Right Knee Extension  4+/5 was 4    Right Ankle Dorsiflexion  4+/5 was 4    Right Ankle Eversion  5/5 was 4+    Left Ankle Dorsiflexion  4+/5 was 4      Ambulation/Gait   Ambulation Distance (Feet)  482 Feet 3MWT    Assistive device  None    Gait Pattern  Step-through pattern;Antalgic;Trendelenburg    Gait Comments  was  359f with RW      Balance   Balance Assessed  Yes      Static Standing Balance   Static Standing - Balance Support  No upper extremity supported    Static Standing Balance -  Activities   Single Leg Stance - Right Leg;Single Leg Stance - Left Leg    Static Standing - Comment/# of Minutes  R: 9.8 sec (2nd attempt) L: 15 sec was 0 sec R, 13sec L      Standardized Balance Assessment   Standardized Balance Assessment  Five Times Sit to Stand;Timed Up and Go Test    Five times sit to stand comments   18sec, no UE, decreased weight shift over RLE was 16.5sec, BUE support, chair      Timed Up and Go Test   TUG  Normal TUG    Normal TUG (seconds)  13.5 no AD    TUG Comments  was 27.19sec with RW              [facility] Adult PT Treatment/Exercise - 01/21/18 0001      Knee/Hip Exercises: Sidelying   Hip ABduction  Right;2 sets;10 reps    Clams  RLE, 2x10 RTB             Balance Exercises - 01/20/18 1607      Balance Exercises: Standing   Tandem Stance  Eyes open;Foam/compliant surface;30 secs last set with RTB palvo    SLS  Eyes open;5 reps Rt 14", Lt 33" max     SLS with Vectors  Solid surface;3 reps;Intermittent upper extremity assist 3x 5"    Sidestepping  2 reps;Theraband RTB down blue line    Heel Raises Limitations  15 combined with squat        PT Education - 01/21/18 0904    Education provided  Yes    Education Details  reassessment findings    Person(s) Educated  Patient    Methods  Explanation;Demonstration    Comprehension  Verbalized understanding;Returned demonstration       PT Short Term Goals - 01/21/18 0904      PT SHORT TERM GOAL #1   Title  Pt will be independent wtih HEP and perform consistently in order to decrease pain and improve overall function.    Time  4    Period  Weeks    Status  Achieved      PT SHORT TERM GOAL #2   Title  Pt will have improved R knee ROM from 3-120deg without pain at end range to maximize gait.    Baseline  6/6: 5-123 (revised to state 3 deg extension becuase her L knee has 3 deg extension)    Time  4    Period  Weeks    Status  Partially Met      PT SHORT TERM GOAL #3   Title  Pt will have 1/2 grade improvement throughout BLE MMT in order to maximize gait, balance, and allow pt to complete functional tasks with greater ease.    Baseline  6/6: see MMT    Time  4    Period  Weeks    Status  Achieved      PT SHORT TERM GOAL #4   Title  Pt will be able to perform R SLS for 5 sec with no UE assist to demo improved balance and functional strengh in order to maximize gait and stair ambulation.    Baseline  6/6: 9.8 sec RLE  Time  4    Period  Weeks    Status  Achieved        PT Long Term Goals - 01/21/18 0905      PT LONG TERM GOAL #1   Title  Pt will have 1 grade improvement throughout BLE MMT in order to further maximize gait, balance, and promote return to PLOF.    Baseline  6/6: see MMT    Time  8    Period  Weeks    Status  On-going      PT LONG TERM GOAL #2   Title  Pt will be able to perform R SLS for 10 sec or > with no UE assist in order to further demo improved  balance, functional strength and maximize overall function.    Baseline  6/6: 9.8sec RLE    Time  8    Period  Weeks    Status  On-going      PT LONG TERM GOAL #3   Title  Pt will be able to perform 5xSTS in 10 sec or < with no UE support and proper mechanics to demo improved balance and functional BLE strength.    Baseline  6/6: 18sec, no UE, off shifted from RLE    Time  8    Period  Weeks    Status  On-going      PT LONG TERM GOAL #4   Title  Pt will be able to perform the TUG in 12 sec or < with LRAD in order to demo improved balance and decrease her risk for falls.    Baseline  6/6: 13.5, no UE    Time  8    Period  Weeks    Status  On-going      PT LONG TERM GOAL #5   Title  Pt will be able to ambulate at least 653f or > during the 3MWT with LRAD and without increases in pain to demo improved tolerance to WB and maximize her ability to return to work once cleared by MD to do so.    Baseline  6/6: 4847fno AD, antalgic and trendelenberg sign gait deviations    Time  8    Period  Weeks    Status  On-going            Plan - 01/21/18 0941    Clinical Impression Statement  PT reassessed pt's goals and outcome measures this date. Pt has made excellent progress towards goals as illustrated above. Her overall strength has improved by 1/2 grade throughout, her balance improved, and all of her objective outcome measures improved. Pt continues with gait deviations but she was able to complete TUG and 3MWT without AD, an improvement from her initial eval. PT POC will be continued as planned for remainder of pt's cert. Ended with hip strengthening and updated HEP to include.     Rehab Potential  Good    PT Frequency  2x / week    PT Duration  8 weeks    PT Treatment/Interventions  ADLs/Self Care Home Management;Biofeedback;Cryotherapy;Electrical Stimulation;Moist Heat;Ultrasound;DME Instruction;Gait training;Stair training;Functional mobility training;Therapeutic  activities;Therapeutic exercise;Balance training;Neuromuscular re-education;Patient/family education;Orthotic Fit/Training;Manual techniques;Scar mobilization;Passive range of motion;Dry needling;Energy conservation;Taping    PT Next Visit Plan  add TKE for last little bit of extension; continue to progress LE and functional strengthening, gait with LRAD. Complete balance training and stability as tolerated.      PT Home Exercise Plan  eval: quad sets, heel slides; 6/6: sidelying  hip and, clams with RTB    Consulted and Agree with Plan of Care  Patient       Patient will benefit from skilled therapeutic intervention in order to improve the following deficits and impairments:  Abnormal gait, Decreased activity tolerance, Decreased balance, Decreased endurance, Decreased mobility, Decreased range of motion, Decreased strength, Difficulty walking, Hypomobility, Increased fascial restricitons, Increased muscle spasms, Impaired flexibility, Improper body mechanics, Impaired UE functional use, Postural dysfunction, Pain  Visit Diagnosis: Difficulty in walking, not elsewhere classified  Muscle weakness (generalized)  Chronic pain of right knee  Pain in right ankle and joints of right foot     Problem List There are no active problems to display for this patient.       Geraldine Solar PT, [place] [address] [place], [place], [address] Phone: [phone]   Fax:  [phone]  Name: Rachel Huber MRN: [id number] Date of Birth: 1956-06-30

## 2018-01-21 NOTE — Therapy (Signed)
[place], [place], [address] Phone: [phone]   Fax:  [phone]  Occupational Therapy Treatment  Patient Details  Name: Rachel Huber MRN: [id number] Date of Birth: April 26, 1956 Referring Provider: Dr. Frazier Richards   Encounter Date: 01/21/2018  OT End of Session - 01/21/18 1451    Visit Number  4    Number of Visits  16    Date for OT Re-Evaluation  03/08/18    Authorization Type  [employer] - following CPT codes allowed:  eval, self care, manual therapy, therapeutic exercise    Authorization - Visit Number  4    Authorization - Number of Visits  60    OT Start Time  1300    OT Stop Time  1344    OT Time Calculation (min)  44 min    Behavior During Therapy  [facility] for tasks assessed/performed       Past Medical History:  Diagnosis Date  . Anxiety   . Arthritis   . Diabetes mellitus without complication ([place])   . Hepatitis    PAST  . Hypertension   . Neuropathy   . Sinus complaint   . Sleep apnea    CPAP  . Thalassemia minor     Past Surgical History:  Procedure Laterality Date  . ABDOMINAL HYSTERECTOMY    . CATARACT EXTRACTION W/PHACO Left 06/11/2016   Procedure: CATARACT EXTRACTION PHACO AND INTRAOCULAR LENS PLACEMENT (IOC);  Surgeon: Estill Cotta, MD;  Location: [facility] ORS;  Service: Ophthalmology;  Laterality: Left;  Lot # [id number] H US:01:38.6 AP%:26.4 CDE:44.15  . COLONOSCOPIES    . CTR      There were no vitals filed for this visit.  Subjective Assessment - 01/21/18 1259    Subjective   S: It's always tight.     Currently in Pain?  No/denies         [facility] OT Assessment - 01/21/18 1258      Assessment   Medical Diagnosis  S/P Right Total Shoulder Replacement      Precautions   Precautions  None               OT Treatments/Exercises (OP) - 01/21/18 1259      Exercises   Exercises  Shoulder      Shoulder Exercises: Supine   Protraction  PROM;5 reps;AAROM;12 reps     Horizontal ABduction  PROM;5 reps;AAROM;12 reps    External Rotation  PROM;5 reps;AAROM;12 reps    Internal Rotation  PROM;5 reps;AAROM;12 reps    Flexion  PROM;5 reps;AAROM;12 reps    ABduction  PROM;5 reps;AAROM;12 reps      Shoulder Exercises: Seated   Protraction  AAROM;10 reps    Horizontal ABduction  AAROM;10 reps    External Rotation  AAROM;10 reps    Flexion  AAROM;10 reps    Abduction  AAROM;10 reps      Shoulder Exercises: Pulleys   Flexion  1 minute    ABduction  1 minute      Shoulder Exercises: ROM/Strengthening   Wall Wash  1'      Manual Therapy   Manual Therapy  Myofascial release    Manual therapy comments  completed seperately from all other skilled interventions    Myofascial Release  myofascial release and manual stretching to right upper arm, scapular, shoulder region  and scar region to decrease fascial restrictions and improve pain free mobility in right arm and shoulder  Balance Exercises - 01/20/18 1607      Balance Exercises: Standing   Tandem Stance  Eyes open;Foam/compliant surface;30 secs last set with RTB palvo    SLS  Eyes open;5 reps Rt 14", Lt 33" max    SLS with Vectors  Solid surface;3 reps;Intermittent upper extremity assist 3x 5"    Sidestepping  2 reps;Theraband RTB down blue line    Heel Raises Limitations  15 combined with squat          OT Short Term Goals - 01/14/18 1005      OT SHORT TERM GOAL #1   Title  Patient will be educated on HEP for improved RUE functional use.    Time  4    Period  Weeks    Status  On-going      OT SHORT TERM GOAL #2   Title  Patient will improve right shoulder P/ROM to [facility] in order to improve ease of donning and doffing clothing.     Time  4    Period  Weeks    Status  On-going      OT SHORT TERM GOAL #3   Title  Patient will increase right shoulder strength to 4/5 in order to improve ability to lift bags of groceries.     Time  4    Period  Weeks    Status  On-going       OT SHORT TERM GOAL #4   Title  Patient will decrease fascial and scar restrictions from moderate to min moderate for improved mobility needed for increased participation in work tasks.     Time  4    Period  Weeks    Status  On-going      OT SHORT TERM GOAL #5   Title  Patient will decrease pain to 3/10 in right arm and shoulder during ADL completion.     Time  4    Period  Weeks    Status  On-going        OT Long Term Goals - 01/14/18 1005      OT LONG TERM GOAL #1   Title  Patient will use her right arm as dominant with all B/IADLs, work, leisure, and driving tasks.     Time  8    Period  Weeks    Status  On-going      OT LONG TERM GOAL #2   Title  Patient will increase right shoulder A/ROM to [facility] in order to reach behind back to fasten bra and reach overhead with out difficulty.     Time  8    Period  Weeks    Status  On-going      OT LONG TERM GOAL #3   Title  Patient will increase right shoulder strength to 5/5 in order to lift items at work and when completing leisure activities.     Time  8    Period  Weeks    Status  On-going      OT LONG TERM GOAL #4   Title  Patient will decrease pain to 2/10 or less in her right shoulder when reaching overhead.    Time  8    Period  Weeks    Status  On-going      OT LONG TERM GOAL #5   Title  Patient will decrese fascial restrictions to minimal in order to improve moblity needed to complete daily activities without pain.     Time  8  Period  Weeks    Status  On-going            Plan - 01/21/18 1451    Clinical Impression Statement  A: Continued with manual therapy to address fascial restrictions in upper arm, deltoid, and trapezius regions limiting ROM. Continued with AA/ROM in supine and sitting, added wall wash and pulleys to further progress ROM. Verbal cuing for form and technique during session.     Plan  P: Attempt protraction, er/IR A/ROM in supine, increase AA/ROM repetitions       Patient will benefit  from skilled therapeutic intervention in order to improve the following deficits and impairments:  Pain, Decreased scar mobility, Increased fascial restrictions, Increased muscle spasms, Decreased strength, Decreased range of motion, Impaired UE functional use  Visit Diagnosis: Stiffness of right shoulder, not elsewhere classified  Acute pain of right shoulder  Other symptoms and signs involving the musculoskeletal system    Problem List There are no active problems to display for this patient.  Rachel Huber, OTR/L  [phone] 01/21/2018, 2:53 PM  [place] [address] [place], [place], [address] Phone: [phone]   Fax:  [phone]  Name: Rachel Huber MRN: [id number] Date of Birth: 1956-08-04

## 2018-01-27 ENCOUNTER — Ambulatory Visit (HOSPITAL_COMMUNITY): Payer: 59 | Admitting: Physical Therapy

## 2018-01-27 ENCOUNTER — Encounter (HOSPITAL_COMMUNITY): Payer: Self-pay | Admitting: Occupational Therapy

## 2018-01-27 ENCOUNTER — Ambulatory Visit (HOSPITAL_COMMUNITY): Payer: 59 | Admitting: Occupational Therapy

## 2018-01-27 ENCOUNTER — Encounter (HOSPITAL_COMMUNITY): Payer: Self-pay | Admitting: Physical Therapy

## 2018-01-27 DIAGNOSIS — M25571 Pain in right ankle and joints of right foot: Secondary | ICD-10-CM

## 2018-01-27 DIAGNOSIS — M6281 Muscle weakness (generalized): Secondary | ICD-10-CM

## 2018-01-27 DIAGNOSIS — M25611 Stiffness of right shoulder, not elsewhere classified: Secondary | ICD-10-CM

## 2018-01-27 DIAGNOSIS — G8929 Other chronic pain: Secondary | ICD-10-CM

## 2018-01-27 DIAGNOSIS — R262 Difficulty in walking, not elsewhere classified: Secondary | ICD-10-CM

## 2018-01-27 DIAGNOSIS — M25561 Pain in right knee: Secondary | ICD-10-CM

## 2018-01-27 DIAGNOSIS — R29898 Other symptoms and signs involving the musculoskeletal system: Secondary | ICD-10-CM

## 2018-01-27 DIAGNOSIS — M25511 Pain in right shoulder: Secondary | ICD-10-CM

## 2018-01-27 NOTE — Therapy (Signed)
[place], [place], [address] Phone: [phone]   Fax:  [phone]  Occupational Therapy Treatment  Patient Details  Name: Rachel Huber MRN: [id number] Date of Birth: 04-07-1956 Referring Provider: Dr. Frazier Richards   Encounter Date: 01/27/2018  OT End of Session - 01/27/18 1609    Visit Number  5    Number of Visits  16    Date for OT Re-Evaluation  03/08/18    Authorization Type  [employer] - following CPT codes allowed:  eval, self care, manual therapy, therapeutic exercise    Authorization - Visit Number  5    Authorization - Number of Visits  60    OT Start Time  8937    OT Stop Time  1600    OT Time Calculation (min)  44 min    Behavior During Therapy  [facility] for tasks assessed/performed       Past Medical History:  Diagnosis Date  . Anxiety   . Arthritis   . Diabetes mellitus without complication ([place])   . Hepatitis    PAST  . Hypertension   . Neuropathy   . Sinus complaint   . Sleep apnea    CPAP  . Thalassemia minor     Past Surgical History:  Procedure Laterality Date  . ABDOMINAL HYSTERECTOMY    . CATARACT EXTRACTION W/PHACO Left 06/11/2016   Procedure: CATARACT EXTRACTION PHACO AND INTRAOCULAR LENS PLACEMENT (IOC);  Surgeon: Estill Cotta, MD;  Location: [facility] ORS;  Service: Ophthalmology;  Laterality: Left;  Lot # [id number] H US:01:38.6 AP%:26.4 CDE:44.15  . COLONOSCOPIES    . CTR      There were no vitals filed for this visit.  Subjective Assessment - 01/27/18 1515    Subjective   S: Whenever I start to move it I get some pain.     Currently in Pain?  Yes    Pain Score  2     Pain Location  Shoulder    Pain Orientation  Right    Pain Descriptors / Indicators  Aching;Sore    Pain Type  Chronic pain    Pain Radiating Towards  elbow    Pain Onset  More than a month ago    Pain Frequency  Intermittent    Aggravating Factors   certain movement, overhead reaching    Pain  Relieving Factors  rest, biofreeze, ice, pain medication    Effect of Pain on Daily Activities  minimal effect on ADLs    Multiple Pain Sites  No         [facility] OT Assessment - 01/27/18 1514      Assessment   Medical Diagnosis  S/P Right Total Shoulder Replacement      Precautions   Precautions  None               OT Treatments/Exercises (OP) - 01/27/18 1519      Exercises   Exercises  Shoulder      Shoulder Exercises: Supine   Protraction  PROM;5 reps;AROM;12 reps    Horizontal ABduction  PROM;5 reps;AAROM;15 reps    External Rotation  PROM;5 reps;AROM;12 reps    Internal Rotation  PROM;5 reps;AROM;12 reps    Flexion  PROM;5 reps;AAROM;15 reps    ABduction  PROM;5 reps;AAROM;15 reps      Shoulder Exercises: Seated   Protraction  AROM;10 reps    Horizontal ABduction  AAROM;12 reps    External Rotation  AROM;10 reps  Flexion  AAROM;12 reps    Abduction  AAROM;12 reps      Shoulder Exercises: Pulleys   Flexion  1 minute    ABduction  1 minute      Manual Therapy   Manual Therapy  Myofascial release    Manual therapy comments  completed seperately from all other skilled interventions    Myofascial Release  myofascial release and manual stretching to right upper arm, scapular, shoulder region  and scar region to decrease fascial restrictions and improve pain free mobility in right arm and shoulder          Balance Exercises - 01/27/18 1449      Balance Exercises: Standing   Tandem Stance  Eyes open;Foam/compliant surface;30 secs last set with RTB palvo    SLS  Eyes open;5 reps Rt 14", Lt 33" max    Rockerboard  Anterior/posterior;Lateral;Other reps (comment) 2' each     Sidestepping  2 reps;Theraband GTB down blue line    Sit to Stand Time  10 elevated mat    Other Standing Exercises  walking on heels x 1 rt; walking on toes x 1 RT           OT Short Term Goals - 01/14/18 1005      OT SHORT TERM GOAL #1   Title  Patient will be educated on HEP  for improved RUE functional use.    Time  4    Period  Weeks    Status  On-going      OT SHORT TERM GOAL #2   Title  Patient will improve right shoulder P/ROM to [facility] in order to improve ease of donning and doffing clothing.     Time  4    Period  Weeks    Status  On-going      OT SHORT TERM GOAL #3   Title  Patient will increase right shoulder strength to 4/5 in order to improve ability to lift bags of groceries.     Time  4    Period  Weeks    Status  On-going      OT SHORT TERM GOAL #4   Title  Patient will decrease fascial and scar restrictions from moderate to min moderate for improved mobility needed for increased participation in work tasks.     Time  4    Period  Weeks    Status  On-going      OT SHORT TERM GOAL #5   Title  Patient will decrease pain to 3/10 in right arm and shoulder during ADL completion.     Time  4    Period  Weeks    Status  On-going        OT Long Term Goals - 01/14/18 1005      OT LONG TERM GOAL #1   Title  Patient will use her right arm as dominant with all B/IADLs, work, leisure, and driving tasks.     Time  8    Period  Weeks    Status  On-going      OT LONG TERM GOAL #2   Title  Patient will increase right shoulder A/ROM to [facility] in order to reach behind back to fasten bra and reach overhead with out difficulty.     Time  8    Period  Weeks    Status  On-going      OT LONG TERM GOAL #3   Title  Patient will increase right shoulder  strength to 5/5 in order to lift items at work and when completing leisure activities.     Time  8    Period  Weeks    Status  On-going      OT LONG TERM GOAL #4   Title  Patient will decrease pain to 2/10 or less in her right shoulder when reaching overhead.    Time  8    Period  Weeks    Status  On-going      OT LONG TERM GOAL #5   Title  Patient will decrese fascial restrictions to minimal in order to improve moblity needed to complete daily activities without pain.     Time  8    Period  Weeks     Status  On-going            Plan - 01/27/18 1610    Clinical Impression Statement  A: Continued with manual therapy to address fascial restrictions that are limiting ROM. Continued with AA/ROM, progressed to A/ROM with protraction and er/IR, pt able to achieve ROM [facility] today. Verbal cuing for form and technique. Pt reports HEP is going well.     Plan  P: Attempt supine A/ROM, continue with wall wash and add prot/ret/elev/dep       Patient will benefit from skilled therapeutic intervention in order to improve the following deficits and impairments:  Pain, Decreased scar mobility, Increased fascial restrictions, Increased muscle spasms, Decreased strength, Decreased range of motion, Impaired UE functional use  Visit Diagnosis: Stiffness of right shoulder, not elsewhere classified  Acute pain of right shoulder  Other symptoms and signs involving the musculoskeletal system    Problem List There are no active problems to display for this patient.  Guadelupe Sabin, OTR/L  [phone] 01/27/2018, 4:14 PM  [place] [address] [place], [place], [address] Phone: [phone]   Fax:  [phone]  Name: Rachel Huber MRN: [id number] Date of Birth: Aug 26, 1955

## 2018-01-27 NOTE — Therapy (Signed)
[place], [place], [address] Phone: [phone]   Fax:  [phone]  Physical Therapy Treatment  Patient Details  Name: Rachel Huber MRN: [id number] Date of Birth: [birth year] Referring Provider: Dr. Frazier Richards   Encounter Date: 01/27/2018  PT End of Session - 01/27/18 1523    Visit Number  9    Number of Visits  17    Date for PT Re-Evaluation  02/18/18 Mini-reassess completed 01/21/18    Authorization Type  [employer] (will update pt's personal health insurance info once obtained and include any visit limitations, etc.)    Authorization Time Period  12/24/17 to 02/18/18    PT Start Time  1435    PT Stop Time  1515    PT Time Calculation (min)  40 min    Activity Tolerance  Patient tolerated treatment well    Behavior During Therapy  [facility] for tasks assessed/performed       Past Medical History:  Diagnosis Date  . Anxiety   . Arthritis   . Diabetes mellitus without complication ([place])   . Hepatitis    PAST  . Hypertension   . Neuropathy   . Sinus complaint   . Sleep apnea    CPAP  . Thalassemia minor     Past Surgical History:  Procedure Laterality Date  . ABDOMINAL HYSTERECTOMY    . CATARACT EXTRACTION W/PHACO Left 06/11/2016   Procedure: CATARACT EXTRACTION PHACO AND INTRAOCULAR LENS PLACEMENT (IOC);  Surgeon: Estill Cotta, MD;  Location: [facility] ORS;  Service: Ophthalmology;  Laterality: Left;  Lot # [id number] H US:01:38.6 AP%:26.4 CDE:44.15  . COLONOSCOPIES    . CTR      There were no vitals filed for this visit.  Subjective Assessment - 01/27/18 1436    Subjective  PT states that she does not use her cane in the house any longer.      Patient Stated Goals  get off of the walker    Currently in Pain?  No/denies    Pain Onset  More than a month ago            [facility] Adult PT Treatment/Exercise - 01/27/18 0001      Exercises   Exercises  Knee/Hip      Knee/Hip Exercises:  Stretches   Gastroc Stretch  Both;3 reps;30 seconds;Limitations    Gastroc Stretch Limitations  slant board      Knee/Hip Exercises: Standing   Heel Raises  Both;15 reps    Heel Raises Limitations  with squats     Forward Lunges  Both;Limitations;15 reps    Forward Lunges Limitations  onto bosu     Terminal Knee Extension  15 reps          Balance Exercises - 01/27/18 1449      Balance Exercises: Standing   Tandem Stance  Eyes open;Foam/compliant surface;30 secs  with RTB palvo    SLS  Eyes open;5 reps Rt 14", Lt 33" max    Rockerboard  Anterior/posterior;Lateral;Other reps (comment) 2' each     Sidestepping  2 reps;Theraband GTB down blue line    Sit to Stand Time  10 elevated mat    Other Standing Exercises  walking on heels x 1 rt; walking on toes x 2RT           PT Short Term Goals - 01/21/18 0904      PT SHORT TERM GOAL #1   Title  Pt will  be independent wtih HEP and perform consistently in order to decrease pain and improve overall function.    Time  4    Period  Weeks    Status  Achieved      PT SHORT TERM GOAL #2   Title  Pt will have improved R knee ROM from 3-120deg without pain at end range to maximize gait.    Baseline  6/6: 5-123 (revised to state 3 deg extension becuase her L knee has 3 deg extension)    Time  4    Period  Weeks    Status  Partially Met      PT SHORT TERM GOAL #3   Title  Pt will have 1/2 grade improvement throughout BLE MMT in order to maximize gait, balance, and allow pt to complete functional tasks with greater ease.    Baseline  6/6: see MMT    Time  4    Period  Weeks    Status  Achieved      PT SHORT TERM GOAL #4   Title  Pt will be able to perform R SLS for 5 sec with no UE assist to demo improved balance and functional strengh in order to maximize gait and stair ambulation.    Baseline  6/6: 9.8 sec RLE    Time  4    Period  Weeks    Status  Achieved        PT Long Term Goals - 01/21/18 3016      PT LONG TERM GOAL  #1   Title  Pt will have 1 grade improvement throughout BLE MMT in order to further maximize gait, balance, and promote return to PLOF.    Baseline  6/6: see MMT    Time  8    Period  Weeks    Status  On-going      PT LONG TERM GOAL #2   Title  Pt will be able to perform R SLS for 10 sec or > with no UE assist in order to further demo improved balance, functional strength and maximize overall function.    Baseline  6/6: 9.8sec RLE    Time  8    Period  Weeks    Status  On-going      PT LONG TERM GOAL #3   Title  Pt will be able to perform 5xSTS in 10 sec or < with no UE support and proper mechanics to demo improved balance and functional BLE strength.    Baseline  6/6: 18sec, no UE, off shifted from RLE    Time  8    Period  Weeks    Status  On-going      PT LONG TERM GOAL #4   Title  Pt will be able to perform the TUG in 12 sec or < with LRAD in order to demo improved balance and decrease her risk for falls.    Baseline  6/6: 13.5, no UE    Time  8    Period  Weeks    Status  On-going      PT LONG TERM GOAL #5   Title  Pt will be able to ambulate at least 638f or > during the 3MWT with LRAD and without increases in pain to demo improved tolerance to WB and maximize her ability to return to work once cleared by MD to do so.    Baseline  6/6: 4876fno AD, antalgic and trendelenberg sign gait deviations  Time  8    Period  Weeks    Status  On-going            Plan - 01/27/18 1524    Clinical Impression Statement  Added heel walk and toe walk to program to challenge balance as well as improving extension on her Rt knee.  Pt with pain limitation on heel walking therefore limited to one round trip.     Rehab Potential  Good    PT Frequency  2x / week    PT Duration  8 weeks    PT Treatment/Interventions  ADLs/Self Care Home Management;Biofeedback;Cryotherapy;Electrical Stimulation;Moist Heat;Ultrasound;DME Instruction;Gait training;Stair training;Functional mobility  training;Therapeutic activities;Therapeutic exercise;Balance training;Neuromuscular re-education;Patient/family education;Orthotic Fit/Training;Manual techniques;Scar mobilization;Passive range of motion;Dry needling;Energy conservation;Taping    PT Next Visit Plan  add supineTKE/PROM for last little bit of extension; continue to progress LE and functional strengthening, gait with no AD outside. .    PT Home Exercise Plan  eval: quad sets, heel slides; 6/6: sidelying hip and, clams with RTB    Consulted and Agree with Plan of Care  Patient       Patient will benefit from skilled therapeutic intervention in order to improve the following deficits and impairments:  Abnormal gait, Decreased activity tolerance, Decreased balance, Decreased endurance, Decreased mobility, Decreased range of motion, Decreased strength, Difficulty walking, Hypomobility, Increased fascial restricitons, Increased muscle spasms, Impaired flexibility, Improper body mechanics, Impaired UE functional use, Postural dysfunction, Pain  Visit Diagnosis: Difficulty in walking, not elsewhere classified  Muscle weakness (generalized)  Chronic pain of right knee  Pain in right ankle and joints of right foot     Problem List There are no active problems to display for this patient.   Rayetta Humphrey, PT CLT [phone] 01/27/2018, 3:26 PM  [place] [address] [place], [place], [address] Phone: [phone]   Fax:  [phone]  Name: Rachel Huber MRN: [id number] Date of Birth: 1956-08-15

## 2018-01-28 ENCOUNTER — Ambulatory Visit (HOSPITAL_COMMUNITY): Payer: 59 | Admitting: Occupational Therapy

## 2018-01-28 ENCOUNTER — Ambulatory Visit (HOSPITAL_COMMUNITY): Payer: 59

## 2018-01-28 ENCOUNTER — Encounter (HOSPITAL_COMMUNITY): Payer: Self-pay | Admitting: Occupational Therapy

## 2018-01-28 DIAGNOSIS — M25571 Pain in right ankle and joints of right foot: Secondary | ICD-10-CM

## 2018-01-28 DIAGNOSIS — M25511 Pain in right shoulder: Secondary | ICD-10-CM

## 2018-01-28 DIAGNOSIS — R262 Difficulty in walking, not elsewhere classified: Secondary | ICD-10-CM | POA: Diagnosis not present

## 2018-01-28 DIAGNOSIS — M6281 Muscle weakness (generalized): Secondary | ICD-10-CM

## 2018-01-28 DIAGNOSIS — M25561 Pain in right knee: Secondary | ICD-10-CM

## 2018-01-28 DIAGNOSIS — M25611 Stiffness of right shoulder, not elsewhere classified: Secondary | ICD-10-CM

## 2018-01-28 DIAGNOSIS — R29898 Other symptoms and signs involving the musculoskeletal system: Secondary | ICD-10-CM

## 2018-01-28 DIAGNOSIS — G8929 Other chronic pain: Secondary | ICD-10-CM

## 2018-01-28 NOTE — Therapy (Signed)
[place], [place], [address] Phone: [phone]   Fax:  [phone]  Physical Therapy Treatment  Patient Details  Name: Rachel Huber MRN: [id number] Date of Birth: [birth year] Referring Provider: Dr. Frazier Richards   Encounter Date: 01/28/2018  PT End of Session - 01/28/18 0835    Visit Number  10    Number of Visits  17    Date for PT Re-Evaluation  02/18/18 Mini-reassess completed 01/21/18    Authorization Type  [employer] (will update pt's personal health insurance info once obtained and include any visit limitations, etc.)    Authorization Time Period  12/24/17 to 02/18/18    PT Start Time  0900    PT Stop Time  0941    PT Time Calculation (min)  41 min    Activity Tolerance  Patient tolerated treatment well    Behavior During Therapy  [facility] for tasks assessed/performed       Past Medical History:  Diagnosis Date  . Anxiety   . Arthritis   . Diabetes mellitus without complication ([place])   . Hepatitis    PAST  . Hypertension   . Neuropathy   . Sinus complaint   . Sleep apnea    CPAP  . Thalassemia minor     Past Surgical History:  Procedure Laterality Date  . ABDOMINAL HYSTERECTOMY    . CATARACT EXTRACTION W/PHACO Left 06/11/2016   Procedure: CATARACT EXTRACTION PHACO AND INTRAOCULAR LENS PLACEMENT (IOC);  Surgeon: Estill Cotta, MD;  Location: [facility] ORS;  Service: Ophthalmology;  Laterality: Left;  Lot # [id number] H US:01:38.6 AP%:26.4 CDE:44.15  . COLONOSCOPIES    . CTR      There were no vitals filed for this visit.  Subjective Assessment - 01/28/18 0856    Subjective  Pt presents to therapy without her [facility] stating she left it in the lobby. Currently, she has no pain.     Patient Stated Goals  get off of the walker    Currently in Pain?  No/denies    Pain Onset  More than a month ago            [facility] Adult PT Treatment/Exercise - 01/28/18 0905      Knee/Hip Exercises:  Stretches   Gastroc Stretch  Both;3 reps;30 seconds;Limitations    Gastroc Stretch Limitations  slant board      Knee/Hip Exercises: Standing   Heel Raises  Both;20 reps    Heel Raises Limitations  heel and toe on foam     Forward Lunges  Both;Limitations;15 reps    Forward Lunges Limitations  onto bosu     Terminal Knee Extension  Right    Terminal Knee Extension Limitations  10x10" holds, small ball against wall    Functional Squat  15 reps    Other Standing Knee Exercises  sidestepping GTB 10f x2RT    Other Standing Knee Exercises  heel taps on 2" step 2x10reps; hip hikes on 4" step 2x10reps      Balance Exercises - 01/28/18 0926      Balance Exercises: Standing   Tandem Stance  Eyes closed;Foam/compliant surface;Intermittent upper extremity support;3 reps;10 secs    SLS  Eyes open;Foam/compliant surface;Intermittent upper extremity support;5 reps;10 secs    Other Standing Exercises  tandem stance foam 2x10 +palov press RTB; cone taps on foam x3RT each             PT Education - 01/28/18 04854  Education provided  Yes    Education Details  exercise technique, updated HEP    Person(s) Educated  Patient    Methods  Explanation;Demonstration;Handout    Comprehension  Verbalized understanding;Returned demonstration       PT Short Term Goals - 01/21/18 0904      PT SHORT TERM GOAL #1   Title  Pt will be independent wtih HEP and perform consistently in order to decrease pain and improve overall function.    Time  4    Period  Weeks    Status  Achieved      PT SHORT TERM GOAL #2   Title  Pt will have improved R knee ROM from 3-120deg without pain at end range to maximize gait.    Baseline  6/6: 5-123 (revised to state 3 deg extension becuase her L knee has 3 deg extension)    Time  4    Period  Weeks    Status  Partially Met      PT SHORT TERM GOAL #3   Title  Pt will have 1/2 grade improvement throughout BLE MMT in order to maximize gait, balance, and allow pt to  complete functional tasks with greater ease.    Baseline  6/6: see MMT    Time  4    Period  Weeks    Status  Achieved      PT SHORT TERM GOAL #4   Title  Pt will be able to perform R SLS for 5 sec with no UE assist to demo improved balance and functional strengh in order to maximize gait and stair ambulation.    Baseline  6/6: 9.8 sec RLE    Time  4    Period  Weeks    Status  Achieved        PT Long Term Goals - 01/21/18 4967      PT LONG TERM GOAL #1   Title  Pt will have 1 grade improvement throughout BLE MMT in order to further maximize gait, balance, and promote return to PLOF.    Baseline  6/6: see MMT    Time  8    Period  Weeks    Status  On-going      PT LONG TERM GOAL #2   Title  Pt will be able to perform R SLS for 10 sec or > with no UE assist in order to further demo improved balance, functional strength and maximize overall function.    Baseline  6/6: 9.8sec RLE    Time  8    Period  Weeks    Status  On-going      PT LONG TERM GOAL #3   Title  Pt will be able to perform 5xSTS in 10 sec or < with no UE support and proper mechanics to demo improved balance and functional BLE strength.    Baseline  6/6: 18sec, no UE, off shifted from RLE    Time  8    Period  Weeks    Status  On-going      PT LONG TERM GOAL #4   Title  Pt will be able to perform the TUG in 12 sec or < with LRAD in order to demo improved balance and decrease her risk for falls.    Baseline  6/6: 13.5, no UE    Time  8    Period  Weeks    Status  On-going      PT LONG TERM GOAL #  5   Title  Pt will be able to ambulate at least 673f or > during the 3MWT with LRAD and without increases in pain to demo improved tolerance to WB and maximize her ability to return to work once cleared by MD to do so.    Baseline  6/6: 4863fno AD, antalgic and trendelenberg sign gait deviations    Time  8    Period  Weeks    Status  On-going            Plan - 01/28/18 [id number]  Clinical Impression  Statement  PT session continued to focus on improving functional strength and addressing the last bit of lacking knee extension. Updated HEP this date to include TKE, mini squats, side stepping and fwd/lat step ups. Min cues for proper exercise technique throughout therex and performed within pt's pain tolerance. Progressed balance activities this date; she was challenged with NBOS on compliant surface and required intermittent UE support throughout. No pain reported at EOS. Continue as planned, progressing as able.     Rehab Potential  Good    PT Frequency  2x / week    PT Duration  8 weeks    PT Treatment/Interventions  ADLs/Self Care Home Management;Biofeedback;Cryotherapy;Electrical Stimulation;Moist Heat;Ultrasound;DME Instruction;Gait training;Stair training;Functional mobility training;Therapeutic activities;Therapeutic exercise;Balance training;Neuromuscular re-education;Patient/family education;Orthotic Fit/Training;Manual techniques;Scar mobilization;Passive range of motion;Dry needling;Energy conservation;Taping    PT Next Visit Plan  add supineTKE/PROM for last little bit of extension; continue to progress LE and functional strengthening, gait with no AD outside. continue balance on compliant surface    PT Home Exercise Plan  eval: quad sets, heel slides; 6/6: sidelying hip and, clams with RTB; 6/13: TKE, mini squats, sidestep with GTB, fwd/lat step ups    Consulted and Agree with Plan of Care  Patient       Patient will benefit from skilled therapeutic intervention in order to improve the following deficits and impairments:  Abnormal gait, Decreased activity tolerance, Decreased balance, Decreased endurance, Decreased mobility, Decreased range of motion, Decreased strength, Difficulty walking, Hypomobility, Increased fascial restricitons, Increased muscle spasms, Impaired flexibility, Improper body mechanics, Impaired UE functional use, Postural dysfunction, Pain  Visit  Diagnosis: Difficulty in walking, not elsewhere classified  Muscle weakness (generalized)  Chronic pain of right knee  Pain in right ankle and joints of right foot     Problem List There are no active problems to display for this patient.       BrGeraldine SolarT, DPSanta Cruz[address]tChemultNCAlaska[address]025[address]hone: [phone] Fax:  [phone]: Rachel CARRILLORN: [id number] of Birth: 11/1955-01-08

## 2018-01-28 NOTE — Patient Instructions (Signed)
Access Code: [email or web address]  URL: [email or web address]  Date: 01/28/2018  Prepared by: Geraldine Solar   Exercises Standing Terminal Knee Extension at [employer] with Diona Foley - 10 reps - 3 sets - 5-10 hold - 1x daily - 7x weekly Mini Squat with Chair - 10 reps - 3 sets - 1x daily - 7x weekly Side Stepping with Resistance at Ankles and Counter Support - 10 reps - 3 sets - 1x daily - 7x weekly Step Up - 10 reps - 3 sets - 1x daily - 7x weekly Lateral Step Ups - 10 reps - 3 sets - 1x daily - 7x weekly

## 2018-01-28 NOTE — Therapy (Signed)
[place], [place], [address] Phone: [phone]   Fax:  [phone]  Occupational Therapy Treatment  Patient Details  Name: Rachel Huber MRN: [id number] Date of Birth: 22-Dec-1955 Referring Provider: Dr. Frazier Richards   Encounter Date: 01/28/2018  OT End of Session - 01/28/18 0902    Visit Number  6    Number of Visits  16    Date for OT Re-Evaluation  03/08/18    Authorization Type  [employer] - following CPT codes allowed:  eval, self care, manual therapy, therapeutic exercise    Authorization - Visit Number  6    Authorization - Number of Visits  60    OT Start Time  0820    OT Stop Time  0900    OT Time Calculation (min)  40 min    Behavior During Therapy  [facility] for tasks assessed/performed       Past Medical History:  Diagnosis Date  . Anxiety   . Arthritis   . Diabetes mellitus without complication ([place])   . Hepatitis    PAST  . Hypertension   . Neuropathy   . Sinus complaint   . Sleep apnea    CPAP  . Thalassemia minor     Past Surgical History:  Procedure Laterality Date  . ABDOMINAL HYSTERECTOMY    . CATARACT EXTRACTION W/PHACO Left 06/11/2016   Procedure: CATARACT EXTRACTION PHACO AND INTRAOCULAR LENS PLACEMENT (IOC);  Surgeon: Estill Cotta, MD;  Location: [facility] ORS;  Service: Ophthalmology;  Laterality: Left;  Lot # [id number] H US:01:38.6 AP%:26.4 CDE:44.15  . COLONOSCOPIES    . CTR      There were no vitals filed for this visit.  Subjective Assessment - 01/28/18 0822    Subjective   S: I actually like having my arm worked out.     Currently in Pain?  Yes    Pain Score  2     Pain Location  Shoulder    Pain Orientation  Right    Pain Descriptors / Indicators  Sore    Pain Type  Chronic pain    Pain Radiating Towards  elbow    Pain Onset  More than a month ago    Pain Frequency  Intermittent    Aggravating Factors   certain movements    Pain Relieving Factors  rest,  heat/ice, pain medication    Effect of Pain on Daily Activities  minimal effect on ADLs    Multiple Pain Sites  No         [facility] OT Assessment - 01/28/18 0822      Assessment   Medical Diagnosis  S/P Right Total Shoulder Replacement      Precautions   Precautions  None               OT Treatments/Exercises (OP) - 01/28/18 0823      Exercises   Exercises  Shoulder      Shoulder Exercises: Supine   Protraction  PROM;5 reps;AROM;12 reps    Horizontal ABduction  PROM;5 reps;AROM;12 reps    External Rotation  PROM;5 reps;AROM;12 reps    Internal Rotation  PROM;5 reps;AROM;12 reps    Flexion  PROM;5 reps;AROM;12 reps    ABduction  PROM;5 reps;AROM;12 reps      Shoulder Exercises: Seated   Protraction  AROM;10 reps    Horizontal ABduction  AROM;10 reps    External Rotation  AROM;10 reps    Flexion  AROM;10 reps  Abduction  AROM;10 reps      Shoulder Exercises: ROM/Strengthening   Wall Wash  1'    Proximal Shoulder Strengthening, Supine  10X each no rest breaks    Prot/Ret//Elev/Dep  1'      Manual Therapy   Manual Therapy  Myofascial release    Manual therapy comments  completed seperately from all other skilled interventions    Myofascial Release  myofascial release and manual stretching to right upper arm, scapular, shoulder region  and scar region to decrease fascial restrictions and improve pain free mobility in right arm and shoulder          Balance Exercises - 01/27/18 1449      Balance Exercises: Standing   Tandem Stance  Eyes open;Foam/compliant surface;30 secs last set with RTB palvo    SLS  Eyes open;5 reps Rt 14", Lt 33" max    Rockerboard  Anterior/posterior;Lateral;Other reps (comment) 2' each     Sidestepping  2 reps;Theraband GTB down blue line    Sit to Stand Time  10 elevated mat    Other Standing Exercises  walking on heels x 1 rt; walking on toes x 1 RT           OT Short Term Goals - 01/14/18 1005      OT SHORT TERM GOAL #1    Title  Patient will be educated on HEP for improved RUE functional use.    Time  4    Period  Weeks    Status  On-going      OT SHORT TERM GOAL #2   Title  Patient will improve right shoulder P/ROM to [facility] in order to improve ease of donning and doffing clothing.     Time  4    Period  Weeks    Status  On-going      OT SHORT TERM GOAL #3   Title  Patient will increase right shoulder strength to 4/5 in order to improve ability to lift bags of groceries.     Time  4    Period  Weeks    Status  On-going      OT SHORT TERM GOAL #4   Title  Patient will decrease fascial and scar restrictions from moderate to min moderate for improved mobility needed for increased participation in work tasks.     Time  4    Period  Weeks    Status  On-going      OT SHORT TERM GOAL #5   Title  Patient will decrease pain to 3/10 in right arm and shoulder during ADL completion.     Time  4    Period  Weeks    Status  On-going        OT Long Term Goals - 01/14/18 1005      OT LONG TERM GOAL #1   Title  Patient will use her right arm as dominant with all B/IADLs, work, leisure, and driving tasks.     Time  8    Period  Weeks    Status  On-going      OT LONG TERM GOAL #2   Title  Patient will increase right shoulder A/ROM to [facility] in order to reach behind back to fasten bra and reach overhead with out difficulty.     Time  8    Period  Weeks    Status  On-going      OT LONG TERM GOAL #3   Title  Patient will increase right shoulder strength to 5/5 in order to lift items at work and when completing leisure activities.     Time  8    Period  Weeks    Status  On-going      OT LONG TERM GOAL #4   Title  Patient will decrease pain to 2/10 or less in her right shoulder when reaching overhead.    Time  8    Period  Weeks    Status  On-going      OT LONG TERM GOAL #5   Title  Patient will decrese fascial restrictions to minimal in order to improve moblity needed to complete daily activities  without pain.     Time  8    Period  Weeks    Status  On-going            Plan - 01/28/18 0903    Clinical Impression Statement  A: Continued with manual therapy to address fascial restrictions limiting ROM, OT notes improvement in restrictions along right upper arm and medial deltoid compared to yesterday. Progressed to A/ROM in supine and sitting today, pt able to achieve ROM WFL, abduction is the most difficult for pt. Added prot/ret/elev/dep and proximal shoulder strengthening in supine today, resumed wall wash. Verbal cuing for form and technique.     Plan  P: Continued with A/ROM, add proximal shoulder strengthening in sitting       Patient will benefit from skilled therapeutic intervention in order to improve the following deficits and impairments:  Pain, Decreased scar mobility, Increased fascial restrictions, Increased muscle spasms, Decreased strength, Decreased range of motion, Impaired UE functional use  Visit Diagnosis: Stiffness of right shoulder, not elsewhere classified  Acute pain of right shoulder  Other symptoms and signs involving the musculoskeletal system    Problem List There are no active problems to display for this patient.  Guadelupe Sabin, OTR/L  [phone] 01/28/2018, 9:05 AM  [place], [place], [address] Phone: [phone]   Fax:  [phone]  Name: SHEALYNN SAULNIER MRN: [id number] Date of Birth: [birth year]

## 2018-02-03 ENCOUNTER — Ambulatory Visit (HOSPITAL_COMMUNITY): Payer: 59 | Admitting: Occupational Therapy

## 2018-02-03 ENCOUNTER — Encounter (HOSPITAL_COMMUNITY): Payer: Self-pay | Admitting: Occupational Therapy

## 2018-02-03 ENCOUNTER — Ambulatory Visit (HOSPITAL_COMMUNITY): Payer: 59

## 2018-02-03 DIAGNOSIS — M25611 Stiffness of right shoulder, not elsewhere classified: Secondary | ICD-10-CM

## 2018-02-03 DIAGNOSIS — R262 Difficulty in walking, not elsewhere classified: Secondary | ICD-10-CM | POA: Diagnosis not present

## 2018-02-03 DIAGNOSIS — G8929 Other chronic pain: Secondary | ICD-10-CM

## 2018-02-03 DIAGNOSIS — M6281 Muscle weakness (generalized): Secondary | ICD-10-CM

## 2018-02-03 DIAGNOSIS — M25561 Pain in right knee: Secondary | ICD-10-CM

## 2018-02-03 DIAGNOSIS — M25511 Pain in right shoulder: Secondary | ICD-10-CM

## 2018-02-03 DIAGNOSIS — M25571 Pain in right ankle and joints of right foot: Secondary | ICD-10-CM

## 2018-02-03 DIAGNOSIS — R29898 Other symptoms and signs involving the musculoskeletal system: Secondary | ICD-10-CM

## 2018-02-03 NOTE — Therapy (Signed)
[place], [place], [address] Phone: [phone]   Fax:  [phone]  Occupational Therapy Treatment  Patient Details  Name: Rachel Huber MRN: [id number] Date of Birth: 06/10/1956 Referring Provider: Dr. Frazier Richards   Encounter Date: 02/03/2018  OT End of Session - 02/03/18 1734    Visit Number  7    Number of Visits  16    Date for OT Re-Evaluation  03/08/18    Authorization Type  [employer] - following CPT codes allowed:  eval, self care, manual therapy, therapeutic exercise    Authorization - Visit Number  7    Authorization - Number of Visits  60    OT Start Time  6222    OT Stop Time  1731    OT Time Calculation (min)  43 min    Behavior During Therapy  [facility] for tasks assessed/performed       Past Medical History:  Diagnosis Date  . Anxiety   . Arthritis   . Diabetes mellitus without complication ([place])   . Hepatitis    PAST  . Hypertension   . Neuropathy   . Sinus complaint   . Sleep apnea    CPAP  . Thalassemia minor     Past Surgical History:  Procedure Laterality Date  . ABDOMINAL HYSTERECTOMY    . CATARACT EXTRACTION W/PHACO Left 06/11/2016   Procedure: CATARACT EXTRACTION PHACO AND INTRAOCULAR LENS PLACEMENT (IOC);  Surgeon: Estill Cotta, MD;  Location: [facility] ORS;  Service: Ophthalmology;  Laterality: Left;  Lot # [id number] H US:01:38.6 AP%:26.4 CDE:44.15  . COLONOSCOPIES    . CTR      There were no vitals filed for this visit.  Subjective Assessment - 02/03/18 1651    Subjective   S: The doctor was really pleased with my progress.     Currently in Pain?  No/denies         [facility] OT Assessment - 02/03/18 1651      Assessment   Medical Diagnosis  S/P Right Total Shoulder Replacement      Precautions   Precautions  None               OT Treatments/Exercises (OP) - 02/03/18 1652      Exercises   Exercises  Shoulder      Shoulder Exercises: Supine   Protraction  PROM;5 reps;AROM;12 reps    Horizontal ABduction  PROM;5 reps;AROM;12 reps    External Rotation  PROM;5 reps;AROM;12 reps    Internal Rotation  PROM;5 reps;AROM;12 reps    Flexion  PROM;5 reps;AROM;12 reps    ABduction  PROM;5 reps;AROM;12 reps      Shoulder Exercises: Seated   Extension  Theraband;10 reps    Theraband Level (Shoulder Extension)  Level 2 (Red)    Retraction  Theraband;10 reps    Theraband Level (Shoulder Retraction)  Level 2 (Red)    Row  Theraband;10 reps    Theraband Level (Shoulder Row)  Level 2 (Red)    Protraction  AROM;12 reps    Horizontal ABduction  AROM;12 reps    External Rotation  AROM;12 reps    Flexion  AROM;12 reps    Abduction  AROM;12 reps      Shoulder Exercises: ROM/Strengthening   Proximal Shoulder Strengthening, Supine  10X each no rest breaks    Proximal Shoulder Strengthening, Seated  10X each no rest breaks      Manual Therapy   Manual Therapy  Myofascial release  Manual therapy comments  completed seperately from all other skilled interventions    Myofascial Release  myofascial release and manual stretching to right upper arm, scapular, shoulder region  and scar region to decrease fascial restrictions and improve pain free mobility in right arm and shoulder          Balance Exercises - 02/03/18 1532      Balance Exercises: Standing   Tandem Stance  Eyes open;Foam/compliant surface;Intermittent upper extremity support +ball toss 2x10 reps each LE forward    Tandem Gait  Forward;Retro;2 reps;Intermittent upper extremity support    Other Standing Exercises  tandem stance foam 2x15 +palov press RTB; cone taps on foam x5RT each          OT Short Term Goals - 01/14/18 1005      OT SHORT TERM GOAL #1   Title  Patient will be educated on HEP for improved RUE functional use.    Time  4    Period  Weeks    Status  On-going      OT SHORT TERM GOAL #2   Title  Patient will improve right shoulder P/ROM to [facility] in order  to improve ease of donning and doffing clothing.     Time  4    Period  Weeks    Status  On-going      OT SHORT TERM GOAL #3   Title  Patient will increase right shoulder strength to 4/5 in order to improve ability to lift bags of groceries.     Time  4    Period  Weeks    Status  On-going      OT SHORT TERM GOAL #4   Title  Patient will decrease fascial and scar restrictions from moderate to min moderate for improved mobility needed for increased participation in work tasks.     Time  4    Period  Weeks    Status  On-going      OT SHORT TERM GOAL #5   Title  Patient will decrease pain to 3/10 in right arm and shoulder during ADL completion.     Time  4    Period  Weeks    Status  On-going        OT Long Term Goals - 01/14/18 1005      OT LONG TERM GOAL #1   Title  Patient will use her right arm as dominant with all B/IADLs, work, leisure, and driving tasks.     Time  8    Period  Weeks    Status  On-going      OT LONG TERM GOAL #2   Title  Patient will increase right shoulder A/ROM to [facility] in order to reach behind back to fasten bra and reach overhead with out difficulty.     Time  8    Period  Weeks    Status  On-going      OT LONG TERM GOAL #3   Title  Patient will increase right shoulder strength to 5/5 in order to lift items at work and when completing leisure activities.     Time  8    Period  Weeks    Status  On-going      OT LONG TERM GOAL #4   Title  Patient will decrease pain to 2/10 or less in her right shoulder when reaching overhead.    Time  8    Period  Weeks    Status  On-going      OT LONG TERM GOAL #5   Title  Patient will decrese fascial restrictions to minimal in order to improve moblity needed to complete daily activities without pain.     Time  8    Period  Weeks    Status  On-going            Plan - 02/03/18 1712    Clinical Impression Statement  A: Pt reports MD is very pleased with her progress and she has been released to  drive. Continued with manual therapy to address fascial restrictions restrictions ROM. Continued with A/ROM this session, added proximal shoulder strengthening in sitting. Also added scapular theraband strengthening. Verbal cuing for form and technique.     Plan  P: Add x to v arms, update HEP for scapular theraband if pt has good form with completion       Patient will benefit from skilled therapeutic intervention in order to improve the following deficits and impairments:  Pain, Decreased scar mobility, Increased fascial restrictions, Increased muscle spasms, Decreased strength, Decreased range of motion, Impaired UE functional use  Visit Diagnosis: Stiffness of right shoulder, not elsewhere classified  Acute pain of right shoulder  Other symptoms and signs involving the musculoskeletal system    Problem List There are no active problems to display for this patient.  Guadelupe Sabin, OTR/L  [phone] 02/03/2018, 5:35 PM  [place] [address] [place], [place], [address] Phone: [phone]   Fax:  [phone]  Name: Rachel Huber MRN: [id number] Date of Birth: 06-07-1956

## 2018-02-03 NOTE — Therapy (Signed)
[place], [place], [address] Phone: [phone]   Fax:  [phone]  Physical Therapy Treatment  Patient Details  Name: Rachel Huber MRN: [id number] Date of Birth: Jul 16, 1956 Referring Provider: Dr. Frazier Richards   Encounter Date: 02/03/2018  PT End of Session - 02/03/18 1508    Visit Number  11    Number of Visits  17    Date for PT Re-Evaluation  02/18/18 Mini-reassess completed 01/21/18    Authorization Type  [employer] (will update pt's personal health insurance info once obtained and include any visit limitations, etc.)    Authorization Time Period  12/24/17 to 02/18/18    PT Start Time  1508    PT Stop Time  1550    PT Time Calculation (min)  42 min    Activity Tolerance  Patient tolerated treatment well    Behavior During Therapy  [facility] for tasks assessed/performed       Past Medical History:  Diagnosis Date  . Anxiety   . Arthritis   . Diabetes mellitus without complication ([place])   . Hepatitis    PAST  . Hypertension   . Neuropathy   . Sinus complaint   . Sleep apnea    CPAP  . Thalassemia minor     Past Surgical History:  Procedure Laterality Date  . ABDOMINAL HYSTERECTOMY    . CATARACT EXTRACTION W/PHACO Left 06/11/2016   Procedure: CATARACT EXTRACTION PHACO AND INTRAOCULAR LENS PLACEMENT (IOC);  Surgeon: Estill Cotta, MD;  Location: [facility] ORS;  Service: Ophthalmology;  Laterality: Left;  Lot # [id number] H US:01:38.6 AP%:26.4 CDE:44.15  . COLONOSCOPIES    . CTR      There were no vitals filed for this visit.  Subjective Assessment - 02/03/18 1508    Subjective  Pt reports that she saw her shoulder surgeon who was extremely pleased with her progress with her shoulder. She also has no knee pain at the moment.     Patient Stated Goals  get off of the walker    Currently in Pain?  No/denies    Pain Onset  More than a month ago            [facility] Adult PT Treatment/Exercise -  02/03/18 0001      Knee/Hip Exercises: Stretches   Gastroc Stretch  Both;3 reps;30 seconds;Limitations    Gastroc Stretch Limitations  slant board      Knee/Hip Exercises: Standing   Heel Raises  Both;20 reps    Heel Raises Limitations  heel and toe on foam     Forward Lunges  Both;15 reps    Forward Lunges Limitations  onto bosu     Side Lunges  Both;15 reps;Limitations    Side Lunges Limitations  onto bosu    Forward Step Up  Both;15 reps;Hand Hold: 2;Limitations    Forward Step Up Limitations  onto bosu    Functional Squat  --    Wall Squat  15 reps      Balance Exercises - 02/03/18 1532      Balance Exercises: Standing   Tandem Stance  Eyes open;Foam/compliant surface;Intermittent upper extremity support +ball toss 2x10 reps each LE forward    Tandem Gait  Forward;Retro;2 reps;Intermittent upper extremity support    Other Standing Exercises  tandem stance foam 2x15 +palov press RTB; cone taps on foam x5RT each            PT Education - 02/03/18 1508  Education provided  Yes    Education Details  exercise technique, continue HEP    Person(s) Educated  Patient    Methods  Explanation;Demonstration    Comprehension  Verbalized understanding       PT Short Term Goals - 01/21/18 0904      PT SHORT TERM GOAL #1   Title  Pt will be independent wtih HEP and perform consistently in order to decrease pain and improve overall function.    Time  4    Period  Weeks    Status  Achieved      PT SHORT TERM GOAL #2   Title  Pt will have improved R knee ROM from 3-120deg without pain at end range to maximize gait.    Baseline  6/6: 5-123 (revised to state 3 deg extension becuase her L knee has 3 deg extension)    Time  4    Period  Weeks    Status  Partially Met      PT SHORT TERM GOAL #3   Title  Pt will have 1/2 grade improvement throughout BLE MMT in order to maximize gait, balance, and allow pt to complete functional tasks with greater ease.    Baseline  6/6: see MMT     Time  4    Period  Weeks    Status  Achieved      PT SHORT TERM GOAL #4   Title  Pt will be able to perform R SLS for 5 sec with no UE assist to demo improved balance and functional strengh in order to maximize gait and stair ambulation.    Baseline  6/6: 9.8 sec RLE    Time  4    Period  Weeks    Status  Achieved        PT Long Term Goals - 01/21/18 0905      PT LONG TERM GOAL #1   Title  Pt will have 1 grade improvement throughout BLE MMT in order to further maximize gait, balance, and promote return to PLOF.    Baseline  6/6: see MMT    Time  8    Period  Weeks    Status  On-going      PT LONG TERM GOAL #2   Title  Pt will be able to perform R SLS for 10 sec or > with no UE assist in order to further demo improved balance, functional strength and maximize overall function.    Baseline  6/6: 9.8sec RLE    Time  8    Period  Weeks    Status  On-going      PT LONG TERM GOAL #3   Title  Pt will be able to perform 5xSTS in 10 sec or < with no UE support and proper mechanics to demo improved balance and functional BLE strength.    Baseline  6/6: 18sec, no UE, off shifted from RLE    Time  8    Period  Weeks    Status  On-going      PT LONG TERM GOAL #4   Title  Pt will be able to perform the TUG in 12 sec or < with LRAD in order to demo improved balance and decrease her risk for falls.    Baseline  6/6: 13.5, no UE    Time  8    Period  Weeks    Status  On-going      PT LONG TERM GOAL #5     Title  Pt will be able to ambulate at least 633f or > during the 3MWT with LRAD and without increases in pain to demo improved tolerance to WB and maximize her ability to return to work once cleared by MD to do so.    Baseline  6/6: 4892fno AD, antalgic and trendelenberg sign gait deviations    Time  8    Period  Weeks    Status  On-going            Plan - 02/03/18 1603    Clinical Impression Statement  Able to progress pt's functional strengthening and dynamic balance  this date. Pt continues to be challenged in NBOS balance activities. She had 1 moderate LOB during fwd tandem gait and was able to maintain balance with min A from PT. Pt inquiring about machines that she could use once she return to the gym once discharged from physical therapy so PT educated pt on this at [facility]. Will add cybex machines and leg press next date. Continue POC as planned, progressing as able.     Rehab Potential  Good    PT Frequency  2x / week    PT Duration  8 weeks    PT Treatment/Interventions  ADLs/Self Care Home Management;Biofeedback;Cryotherapy;Electrical Stimulation;Moist Heat;Ultrasound;DME Instruction;Gait training;Stair training;Functional mobility training;Therapeutic activities;Therapeutic exercise;Balance training;Neuromuscular re-education;Patient/family education;Orthotic Fit/Training;Manual techniques;Scar mobilization;Passive range of motion;Dry needling;Energy conservation;Taping    PT Next Visit Plan  add cybex machines and leg press; add 4-way hip cable strengthening; continue to progress LE and functional strengthening, gait with no AD outside. continue balance on compliant surface    PT Home Exercise Plan  eval: quad sets, heel slides; 6/6: sidelying hip and, clams with RTB; 6/13: TKE, mini squats, sidestep with GTB, fwd/lat step ups    Consulted and Agree with Plan of Care  Patient       Patient will benefit from skilled therapeutic intervention in order to improve the following deficits and impairments:  Abnormal gait, Decreased activity tolerance, Decreased balance, Decreased endurance, Decreased mobility, Decreased range of motion, Decreased strength, Difficulty walking, Hypomobility, Increased fascial restricitons, Increased muscle spasms, Impaired flexibility, Improper body mechanics, Impaired UE functional use, Postural dysfunction, Pain  Visit Diagnosis: Difficulty in walking, not elsewhere classified  Muscle weakness (generalized)  Chronic pain of right  knee  Pain in right ankle and joints of right foot     Problem List There are no active problems to display for this patient.     BrGeraldine SolarT, DPSeymour3[address] Oak StreettOiltonNCAlaska[address]hone: [phone] Fax:  [phone]: Rachel MORRRN: [id number] of Birth: 4/06-Feb-1956

## 2018-02-04 ENCOUNTER — Ambulatory Visit (HOSPITAL_COMMUNITY): Payer: 59

## 2018-02-04 ENCOUNTER — Encounter (HOSPITAL_COMMUNITY): Payer: Self-pay | Admitting: Occupational Therapy

## 2018-02-04 ENCOUNTER — Ambulatory Visit (HOSPITAL_COMMUNITY): Payer: 59 | Admitting: Occupational Therapy

## 2018-02-04 DIAGNOSIS — R29898 Other symptoms and signs involving the musculoskeletal system: Secondary | ICD-10-CM

## 2018-02-04 DIAGNOSIS — M6281 Muscle weakness (generalized): Secondary | ICD-10-CM

## 2018-02-04 DIAGNOSIS — M25561 Pain in right knee: Secondary | ICD-10-CM

## 2018-02-04 DIAGNOSIS — G8929 Other chronic pain: Secondary | ICD-10-CM

## 2018-02-04 DIAGNOSIS — M25611 Stiffness of right shoulder, not elsewhere classified: Secondary | ICD-10-CM

## 2018-02-04 DIAGNOSIS — M25571 Pain in right ankle and joints of right foot: Secondary | ICD-10-CM

## 2018-02-04 DIAGNOSIS — M25511 Pain in right shoulder: Secondary | ICD-10-CM

## 2018-02-04 DIAGNOSIS — R262 Difficulty in walking, not elsewhere classified: Secondary | ICD-10-CM

## 2018-02-04 NOTE — Therapy (Signed)
[place], [place], [address] Phone: [phone]   Fax:  [phone]  Occupational Therapy Treatment  Patient Details  Name: Rachel Huber MRN: [id number] Date of Birth: [birth year] Referring Provider: Dr. Frazier Richards   Encounter Date: 02/04/2018  OT End of Session - 02/04/18 1026    Visit Number  8    Number of Visits  16    Date for OT Re-Evaluation  03/08/18    Authorization Type  [employer] - following CPT codes allowed:  eval, self care, manual therapy, therapeutic exercise    Authorization - Visit Number  8    Authorization - Number of Visits  64    OT Start Time  0944    OT Stop Time  1030    OT Time Calculation (min)  46 min    Behavior During Therapy  [facility] for tasks assessed/performed       Past Medical History:  Diagnosis Date  . Anxiety   . Arthritis   . Diabetes mellitus without complication ([place])   . Hepatitis    PAST  . Hypertension   . Neuropathy   . Sinus complaint   . Sleep apnea    CPAP  . Thalassemia minor     Past Surgical History:  Procedure Laterality Date  . ABDOMINAL HYSTERECTOMY    . CATARACT EXTRACTION W/PHACO Left 06/11/2016   Procedure: CATARACT EXTRACTION PHACO AND INTRAOCULAR LENS PLACEMENT (IOC);  Surgeon: Estill Cotta, MD;  Location: [facility] ORS;  Service: Ophthalmology;  Laterality: Left;  Lot # [id number] H US:01:38.6 AP%:26.4 CDE:44.15  . COLONOSCOPIES    . CTR      There were no vitals filed for this visit.  Subjective Assessment - 02/04/18 0944    Subjective   S: I haven't been awake long enough for my arm to begin hurting today.     Currently in Pain?  No/denies         [facility] OT Assessment - 02/04/18 0944      Assessment   Medical Diagnosis  S/P Right Total Shoulder Replacement      Precautions   Precautions  None               OT Treatments/Exercises (OP) - 02/04/18 0945      Exercises   Exercises  Shoulder      Shoulder  Exercises: Supine   Protraction  PROM;5 reps;AROM;15 reps    Horizontal ABduction  PROM;5 reps;AROM;15 reps    External Rotation  PROM;5 reps;AROM;15 reps    Internal Rotation  PROM;5 reps;AROM;15 reps    Flexion  PROM;5 reps;AROM;15 reps    ABduction  PROM;5 reps;AROM;15 reps      Shoulder Exercises: Seated   Protraction  AROM;12 reps    Horizontal ABduction  AROM;12 reps    External Rotation  AROM;12 reps    Flexion  AROM;12 reps    Abduction  AROM;12 reps      Shoulder Exercises: Standing   Extension  Theraband;10 reps    Theraband Level (Shoulder Extension)  Level 2 (Red)    Row  Theraband;10 reps    Theraband Level (Shoulder Row)  Level 2 (Red)    Retraction  Theraband;10 reps    Theraband Level (Shoulder Retraction)  Level 2 (Red)      Shoulder Exercises: ROM/Strengthening   UBE (Upper Arm Bike)  Level 1 2' foward 2' reverse    X to V Arms  10X    Proximal  Shoulder Strengthening, Supine  10X each no rest breaks    Proximal Shoulder Strengthening, Seated  10X each no rest breaks      Manual Therapy   Manual Therapy  Myofascial release    Manual therapy comments  completed seperately from all other skilled interventions    Myofascial Release  myofascial release and manual stretching to right upper arm, scapular, shoulder region  and scar region to decrease fascial restrictions and improve pain free mobility in right arm and shoulder          Balance Exercises - 02/03/18 1532      Balance Exercises: Standing   Tandem Stance  Eyes open;Foam/compliant surface;Intermittent upper extremity support +ball toss 2x10 reps each LE forward    Tandem Gait  Forward;Retro;2 reps;Intermittent upper extremity support    Other Standing Exercises  tandem stance foam 2x15 +palov press RTB; cone taps on foam x5RT each        OT Education - 02/04/18 1002    Education provided  Yes    Education Details  A/ROM, scapular theraband-red    Person(s) Educated  Patient    Methods   Explanation;Demonstration;Handout    Comprehension  Verbalized understanding;Returned demonstration       OT Short Term Goals - 01/14/18 1005      OT SHORT TERM GOAL #1   Title  Patient will be educated on HEP for improved RUE functional use.    Time  4    Period  Weeks    Status  On-going      OT SHORT TERM GOAL #2   Title  Patient will improve right shoulder P/ROM to [facility] in order to improve ease of donning and doffing clothing.     Time  4    Period  Weeks    Status  On-going      OT SHORT TERM GOAL #3   Title  Patient will increase right shoulder strength to 4/5 in order to improve ability to lift bags of groceries.     Time  4    Period  Weeks    Status  On-going      OT SHORT TERM GOAL #4   Title  Patient will decrease fascial and scar restrictions from moderate to min moderate for improved mobility needed for increased participation in work tasks.     Time  4    Period  Weeks    Status  On-going      OT SHORT TERM GOAL #5   Title  Patient will decrease pain to 3/10 in right arm and shoulder during ADL completion.     Time  4    Period  Weeks    Status  On-going        OT Long Term Goals - 01/14/18 1005      OT LONG TERM GOAL #1   Title  Patient will use her right arm as dominant with all B/IADLs, work, leisure, and driving tasks.     Time  8    Period  Weeks    Status  On-going      OT LONG TERM GOAL #2   Title  Patient will increase right shoulder A/ROM to [facility] in order to reach behind back to fasten bra and reach overhead with out difficulty.     Time  8    Period  Weeks    Status  On-going      OT LONG TERM GOAL #3   Title  Patient will increase right shoulder strength to 5/5 in order to lift items at work and when completing leisure activities.     Time  8    Period  Weeks    Status  On-going      OT LONG TERM GOAL #4   Title  Patient will decrease pain to 2/10 or less in her right shoulder when reaching overhead.    Time  8    Period  Weeks     Status  On-going      OT LONG TERM GOAL #5   Title  Patient will decrese fascial restrictions to minimal in order to improve moblity needed to complete daily activities without pain.     Time  8    Period  Weeks    Status  On-going            Plan - 02/04/18 1010    Clinical Impression Statement  A: Continued with manual therapy to address fascial restrictions along upper arm, deltoid, and scapular regions limiting ROM. Pt with slight catching and discomfort during passive er. Continued with A/ROM increasing supine repetitions to 15. Added x to v arms and UBE, updated HEP for A/ROM and scapular theraband. Verbal cuing for form and technique during exercises.     Plan  P: Mini-reassessment. Follow up on HEP, attempt overhead lacing       Patient will benefit from skilled therapeutic intervention in order to improve the following deficits and impairments:  Pain, Decreased scar mobility, Increased fascial restrictions, Increased muscle spasms, Decreased strength, Decreased range of motion, Impaired UE functional use  Visit Diagnosis: Stiffness of right shoulder, not elsewhere classified  Acute pain of right shoulder  Other symptoms and signs involving the musculoskeletal system    Problem List There are no active problems to display for this patient.  Guadelupe Sabin, OTR/L  [phone] 02/04/2018, 10:56 AM  [place], [place], [address] Phone: [phone]   Fax:  [phone]  Name: Rachel Huber MRN: [id number] Date of Birth: Dec 24, 1955

## 2018-02-04 NOTE — Patient Instructions (Signed)
Repeat all exercises 10-15 times, 1-2 times per day.  1) Shoulder Protraction    Begin with elbows by your side, slowly "punch" straight out in front of you.      2) Shoulder Flexion  Supine:     Standing:         Begin with arms at your side with thumbs pointed up, slowly raise both arms up and forward towards overhead.               3) Horizontal abduction/adduction  Supine:   Standing:           Begin with arms straight out in front of you, bring out to the side in at "T" shape. Keep arms straight entire time.                 4) Internal & External Rotation    *No band* -Stand with elbows at the side and elbows bent 90 degrees. Move your forearms away from your body, then bring back inward toward the body.     5) Shoulder Abduction  Supine:     Standing:       Lying on your back begin with your arms flat on the table next to your side. Slowly move your arms out to the side so that they go overhead, in a jumping jack or snow angel movement.    Theraband:   (Home) Extension: Isometric / Bilateral Arm Retraction - Sitting   Facing anchor, hold hands and elbow at shoulder height, with elbow bent.  Pull arms back to squeeze shoulder blades together. Repeat 10-15 times. 1-3 times/day.   (Clinic) Extension / Flexion (Assist)   Face anchor, pull arms back, keeping elbow straight, and squeze shoulder blades together. Repeat 10-15 times. 1-3 times/day.   Copyright  VHI. All rights reserved.   (Home) Retraction: Row - Bilateral (Anchor)   Facing anchor, arms reaching forward, pull hands toward stomach, keeping elbows bent and at your sides and pinching shoulder blades together. Repeat 10-15 times. 1-3 times/day.   Copyright  VHI. All rights reserved.

## 2018-02-04 NOTE — Therapy (Signed)
[place], [place], [address] Phone: [phone]   Fax:  [phone]  Physical Therapy Treatment  Patient Details  Name: Rachel Huber MRN: [id number] Date of Birth: 1956-05-23 Referring Provider: Dr. Frazier Richards   Encounter Date: 02/04/2018  PT End of Session - 02/04/18 0915    Visit Number  12    Number of Visits  17    Date for PT Re-Evaluation  02/18/18 Mini-reassess completed 01/21/18    Authorization Type  [employer] (will update pt's personal health insurance info once obtained and include any visit limitations, etc.)    Authorization Time Period  12/24/17 to 02/18/18    PT Start Time  0915 pt arrived late    PT Stop Time  0943    PT Time Calculation (min)  28 min    Activity Tolerance  Patient tolerated treatment well    Behavior During Therapy  [facility] for tasks assessed/performed       Past Medical History:  Diagnosis Date  . Anxiety   . Arthritis   . Diabetes mellitus without complication ([place])   . Hepatitis    PAST  . Hypertension   . Neuropathy   . Sinus complaint   . Sleep apnea    CPAP  . Thalassemia minor     Past Surgical History:  Procedure Laterality Date  . ABDOMINAL HYSTERECTOMY    . CATARACT EXTRACTION W/PHACO Left 06/11/2016   Procedure: CATARACT EXTRACTION PHACO AND INTRAOCULAR LENS PLACEMENT (IOC);  Surgeon: Estill Cotta, MD;  Location: [facility] ORS;  Service: Ophthalmology;  Laterality: Left;  Lot # [id number] H US:01:38.6 AP%:26.4 CDE:44.15  . COLONOSCOPIES    . CTR      There were no vitals filed for this visit.  Subjective Assessment - 02/04/18 0915    Subjective  Pt reports that she overslept this morning which is why she is late. She reports just early morning stiffness, no pain.    Patient Stated Goals  get off of the walker    Currently in Pain?  No/denies    Pain Onset  More than a month ago             [facility] Adult PT Treatment/Exercise - 02/04/18  0001      Knee/Hip Exercises: Stretches   Gastroc Stretch  Both;3 reps;30 seconds;Limitations    Gastroc Stretch Limitations  slant board      Knee/Hip Exercises: Machines for Strengthening   Cybex Knee Extension  2 plates, BLE, 3x10 reps    Cybex Knee Flexion  6 plates, BLE, 2x10 reps, 5 plates x10 reps on 3rd set    Cybex Leg Press  30deg, BLE, 2x10 reps, 27deg x10 reps 3rd set      Knee/Hip Exercises: Standing   Heel Raises  Right;2 sets;10 reps    Other Standing Knee Exercises  fwd/retro monster walks GTB 18f x1RT           PT Education - 02/04/18 0941    Education provided  Yes    Education Details  exercise technique, expect muscle soreness following today    Person(s) Educated  Patient    Methods  Explanation;Demonstration    Comprehension  Verbalized understanding;Returned demonstration         PT Short Term Goals - 01/21/18 0904      PT SHORT TERM GOAL #1   Title  Pt will be independent wtih HEP and perform consistently in order to decrease pain and  improve overall function.    Time  4    Period  Weeks    Status  Achieved      PT SHORT TERM GOAL #2   Title  Pt will have improved R knee ROM from 3-120deg without pain at end range to maximize gait.    Baseline  6/6: 5-123 (revised to state 3 deg extension becuase her L knee has 3 deg extension)    Time  4    Period  Weeks    Status  Partially Met      PT SHORT TERM GOAL #3   Title  Pt will have 1/2 grade improvement throughout BLE MMT in order to maximize gait, balance, and allow pt to complete functional tasks with greater ease.    Baseline  6/6: see MMT    Time  4    Period  Weeks    Status  Achieved      PT SHORT TERM GOAL #4   Title  Pt will be able to perform R SLS for 5 sec with no UE assist to demo improved balance and functional strengh in order to maximize gait and stair ambulation.    Baseline  6/6: 9.8 sec RLE    Time  4    Period  Weeks    Status  Achieved        PT Long Term Goals -  01/21/18 5102      PT LONG TERM GOAL #1   Title  Pt will have 1 grade improvement throughout BLE MMT in order to further maximize gait, balance, and promote return to PLOF.    Baseline  6/6: see MMT    Time  8    Period  Weeks    Status  On-going      PT LONG TERM GOAL #2   Title  Pt will be able to perform R SLS for 10 sec or > with no UE assist in order to further demo improved balance, functional strength and maximize overall function.    Baseline  6/6: 9.8sec RLE    Time  8    Period  Weeks    Status  On-going      PT LONG TERM GOAL #3   Title  Pt will be able to perform 5xSTS in 10 sec or < with no UE support and proper mechanics to demo improved balance and functional BLE strength.    Baseline  6/6: 18sec, no UE, off shifted from RLE    Time  8    Period  Weeks    Status  On-going      PT LONG TERM GOAL #4   Title  Pt will be able to perform the TUG in 12 sec or < with LRAD in order to demo improved balance and decrease her risk for falls.    Baseline  6/6: 13.5, no UE    Time  8    Period  Weeks    Status  On-going      PT LONG TERM GOAL #5   Title  Pt will be able to ambulate at least 664f or > during the 3MWT with LRAD and without increases in pain to demo improved tolerance to WB and maximize her ability to return to work once cleared by MD to do so.    Baseline  6/6: 4846fno AD, antalgic and trendelenberg sign gait deviations    Time  8    Period  Weeks  Status  On-going            Plan - 02/04/18 1779    Clinical Impression Statement  Session limited as pt arrived late for appointment. Able to add cybex and leg press machines this date for increased BLE strength; pt tolerated well, not reporting any pain during. She was noted to have a large difference in HS strength vs quad strength as she could curl 45-57.5# and only extend 18#, which just indicates that the pt needs more quad strengthening. Will continue to address this and progress pt as tolerated.      Rehab Potential  Good    PT Frequency  2x / week    PT Duration  8 weeks    PT Treatment/Interventions  ADLs/Self Care Home Management;Biofeedback;Cryotherapy;Electrical Stimulation;Moist Heat;Ultrasound;DME Instruction;Gait training;Stair training;Functional mobility training;Therapeutic activities;Therapeutic exercise;Balance training;Neuromuscular re-education;Patient/family education;Orthotic Fit/Training;Manual techniques;Scar mobilization;Passive range of motion;Dry needling;Energy conservation;Taping    PT Next Visit Plan  continue cybex machines and leg press; add 4-way hip cable strengthening; continue to progress LE and functional strengthening, gait with no AD outside. continue balance on compliant surface    PT Home Exercise Plan  eval: quad sets, heel slides; 6/6: sidelying hip and, clams with RTB; 6/13: TKE, mini squats, sidestep with GTB, fwd/lat step ups    Consulted and Agree with Plan of Care  Patient       Patient will benefit from skilled therapeutic intervention in order to improve the following deficits and impairments:  Abnormal gait, Decreased activity tolerance, Decreased balance, Decreased endurance, Decreased mobility, Decreased range of motion, Decreased strength, Difficulty walking, Hypomobility, Increased fascial restricitons, Increased muscle spasms, Impaired flexibility, Improper body mechanics, Impaired UE functional use, Postural dysfunction, Pain  Visit Diagnosis: Difficulty in walking, not elsewhere classified  Muscle weakness (generalized)  Chronic pain of right knee  Pain in right ankle and joints of right foot     Problem List There are no active problems to display for this patient.     Geraldine Solar PT, [facility] [address] [place], [place], [address] Phone: [phone]   Fax:  [phone]  Name: Rachel Huber MRN: [id number] Date of Birth: 12-11-1955

## 2018-02-09 ENCOUNTER — Ambulatory Visit (HOSPITAL_COMMUNITY): Payer: 59 | Admitting: Physical Therapy

## 2018-02-09 DIAGNOSIS — R262 Difficulty in walking, not elsewhere classified: Secondary | ICD-10-CM

## 2018-02-09 DIAGNOSIS — G8929 Other chronic pain: Secondary | ICD-10-CM

## 2018-02-09 DIAGNOSIS — M25571 Pain in right ankle and joints of right foot: Secondary | ICD-10-CM

## 2018-02-09 DIAGNOSIS — M6281 Muscle weakness (generalized): Secondary | ICD-10-CM

## 2018-02-09 DIAGNOSIS — M25561 Pain in right knee: Secondary | ICD-10-CM

## 2018-02-09 NOTE — Therapy (Signed)
[place], [place], [address] Phone: [phone]   Fax:  [phone]  Physical Therapy Treatment  Patient Details  Name: Rachel Huber MRN: [id number] Date of Birth: 03/05/1956 Referring Provider: Dr.  Richards   Encounter Date: 02/09/2018  PT End of Session - 02/09/18 1447    Visit Number  13    Number of Visits  17    Date for PT Re-Evaluation  02/18/18 Mini-reassess completed 01/21/18    Authorization Type  [employer] (will update pt's personal health insurance info once obtained and include any visit limitations, etc.)    Authorization Time Period  12/24/17 to 02/18/18    PT Start Time  1350    PT Stop Time  1435    PT Time Calculation (min)  45 min    Activity Tolerance  Patient tolerated treatment well    Behavior During Therapy  [facility] for tasks assessed/performed       Past Medical History:  Diagnosis Date  . Anxiety   . Arthritis   . Diabetes mellitus without complication ([place])   . Hepatitis    PAST  . Hypertension   . Neuropathy   . Sinus complaint   . Sleep apnea    CPAP  . Thalassemia minor     Past Surgical History:  Procedure Laterality Date  . ABDOMINAL HYSTERECTOMY    . CATARACT EXTRACTION W/PHACO Left 06/11/2016   Procedure: CATARACT EXTRACTION PHACO AND INTRAOCULAR LENS PLACEMENT (IOC);  Surgeon: Estill Cotta, MD;  Location: [facility] ORS;  Service: Ophthalmology;  Laterality: Left;  Lot # [id number] H US:01:38.6 AP%:26.4 CDE:44.15  . COLONOSCOPIES    . CTR      There were no vitals filed for this visit.  Subjective Assessment - 02/09/18 1449    Subjective  pt reports no difficulty today.  Plans on going to [place] when she is discharged from here.      Currently in Pain?  No/denies                       [facility] Adult PT Treatment/Exercise - 02/09/18 0001      Knee/Hip Exercises: Stretches   Gastroc Stretch  Both;3 reps;30 seconds;Limitations    Gastroc Stretch Limitations  slant board      Knee/Hip Exercises: Machines for Strengthening   Cybex Knee Extension  2 plates, BLE, 2x10 reps, singles 1PL 10 reps each    Cybex Knee Flexion  6 plates, BLE, 2x10 reps.  singles 3PL 2X10 reps each      Knee/Hip Exercises: Standing   Heel Raises  Right;2 sets;10 reps    Other Standing Knee Exercises  fwd/retro monster walks GTB 81f x2RT               PT Short Term Goals - 01/21/18 0904      PT SHORT TERM GOAL #1   Title  Pt will be independent wtih HEP and perform consistently in order to decrease pain and improve overall function.    Time  4    Period  Weeks    Status  Achieved      PT SHORT TERM GOAL #2   Title  Pt will have improved R knee ROM from 3-120deg without pain at end range to maximize gait.    Baseline  6/6: 5-123 (revised to state 3 deg extension becuase her L knee has 3 deg extension)    Time  4  Period  Weeks    Status  Partially Met      PT SHORT TERM GOAL #3   Title  Pt will have 1/2 grade improvement throughout BLE MMT in order to maximize gait, balance, and allow pt to complete functional tasks with greater ease.    Baseline  6/6: see MMT    Time  4    Period  Weeks    Status  Achieved      PT SHORT TERM GOAL #4   Title  Pt will be able to perform R SLS for 5 sec with no UE assist to demo improved balance and functional strengh in order to maximize gait and stair ambulation.    Baseline  6/6: 9.8 sec RLE    Time  4    Period  Weeks    Status  Achieved        PT Long Term Goals - 01/21/18 1962      PT LONG TERM GOAL #1   Title  Pt will have 1 grade improvement throughout BLE MMT in order to further maximize gait, balance, and promote return to PLOF.    Baseline  6/6: see MMT    Time  8    Period  Weeks    Status  On-going      PT LONG TERM GOAL #2   Title  Pt will be able to perform R SLS for 10 sec or > with no UE assist in order to further demo improved balance, functional strength and  maximize overall function.    Baseline  6/6: 9.8sec RLE    Time  8    Period  Weeks    Status  On-going      PT LONG TERM GOAL #3   Title  Pt will be able to perform 5xSTS in 10 sec or < with no UE support and proper mechanics to demo improved balance and functional BLE strength.    Baseline  6/6: 18sec, no UE, off shifted from RLE    Time  8    Period  Weeks    Status  On-going      PT LONG TERM GOAL #4   Title  Pt will be able to perform the TUG in 12 sec or < with LRAD in order to demo improved balance and decrease her risk for falls.    Baseline  6/6: 13.5, no UE    Time  8    Period  Weeks    Status  On-going      PT LONG TERM GOAL #5   Title  Pt will be able to ambulate at least 624f or > during the 3MWT with LRAD and without increases in pain to demo improved tolerance to WB and maximize her ability to return to work once cleared by MD to do so.    Baseline  6/6: 488fno AD, antalgic and trendelenberg sign gait deviations    Time  8    Period  Weeks    Status  On-going            Plan - 02/09/18 1710    Clinical Impression Statement  Continued with strengthening of LE's and increased independence wtih gait.  Continued with cybex machines and added LE isolated strengthening as well.  Pt with most difficulty compleing extension due to extreme weakness.  PT reported no increase in pain at end of session     Rehab Potential  Good    PT Frequency  2x / week    PT Duration  8 weeks    PT Treatment/Interventions  ADLs/Self Care Home Management;Biofeedback;Cryotherapy;Electrical Stimulation;Moist Heat;Ultrasound;DME Instruction;Gait training;Stair training;Functional mobility training;Therapeutic activities;Therapeutic exercise;Balance training;Neuromuscular re-education;Patient/family education;Orthotic Fit/Training;Manual techniques;Scar mobilization;Passive range of motion;Dry needling;Energy conservation;Taping    PT Next Visit Plan  continue cybex machines and leg press;  add 4-way hip cable strengthening; continue to progress LE and functional strengthening, gait with no AD outside. continue balance on compliant surface    PT Home Exercise Plan  eval: quad sets, heel slides; 6/6: sidelying hip and, clams with RTB; 6/13: TKE, mini squats, sidestep with GTB, fwd/lat step ups    Consulted and Agree with Plan of Care  Patient       Patient will benefit from skilled therapeutic intervention in order to improve the following deficits and impairments:  Abnormal gait, Decreased activity tolerance, Decreased balance, Decreased endurance, Decreased mobility, Decreased range of motion, Decreased strength, Difficulty walking, Hypomobility, Increased fascial restricitons, Increased muscle spasms, Impaired flexibility, Improper body mechanics, Impaired UE functional use, Postural dysfunction, Pain  Visit Diagnosis: Difficulty in walking, not elsewhere classified  Muscle weakness (generalized)  Chronic pain of right knee  Pain in right ankle and joints of right foot     Problem List There are no active problems to display for this patient.  Teena Irani, PTA/CLT [phone]   Teena Irani 02/09/2018, 5:13 PM  [place] [address] [place], [place], [address] Phone: [phone]   Fax:  [phone]  Name: Rachel Huber MRN: [id number] Date of Birth: 09-Jun-1956

## 2018-02-10 ENCOUNTER — Ambulatory Visit (HOSPITAL_COMMUNITY): Payer: 59

## 2018-02-10 ENCOUNTER — Encounter (HOSPITAL_COMMUNITY): Payer: Self-pay

## 2018-02-10 ENCOUNTER — Encounter (HOSPITAL_COMMUNITY): Payer: Self-pay | Admitting: Occupational Therapy

## 2018-02-10 ENCOUNTER — Ambulatory Visit (HOSPITAL_COMMUNITY): Payer: 59 | Admitting: Occupational Therapy

## 2018-02-10 DIAGNOSIS — M6281 Muscle weakness (generalized): Secondary | ICD-10-CM

## 2018-02-10 DIAGNOSIS — M25611 Stiffness of right shoulder, not elsewhere classified: Secondary | ICD-10-CM

## 2018-02-10 DIAGNOSIS — M25571 Pain in right ankle and joints of right foot: Secondary | ICD-10-CM

## 2018-02-10 DIAGNOSIS — R262 Difficulty in walking, not elsewhere classified: Secondary | ICD-10-CM | POA: Diagnosis not present

## 2018-02-10 DIAGNOSIS — R29898 Other symptoms and signs involving the musculoskeletal system: Secondary | ICD-10-CM

## 2018-02-10 DIAGNOSIS — M25511 Pain in right shoulder: Secondary | ICD-10-CM

## 2018-02-10 DIAGNOSIS — G8929 Other chronic pain: Secondary | ICD-10-CM

## 2018-02-10 DIAGNOSIS — M25561 Pain in right knee: Secondary | ICD-10-CM

## 2018-02-10 NOTE — Therapy (Addendum)
[facility] [address] [place], [place], [address] Phone: [phone]   Fax:  [phone]  Occupational Therapy Treatment  Patient Details  Name: Rachel Huber MRN: [id number] Date of Birth: 27-May-1956 Referring Provider: Dr. Frazier Richards  Progress Note Reporting Period  01/10/2018 to 02/12/2018  See note below for Objective Data and Assessment of Progress/Goals.     Encounter Date: 02/10/2018  OT End of Session - 02/10/18 1517    Visit Number  9    Number of Visits  16    Date for OT Re-Evaluation  03/08/18    Authorization Type  [employer] - following CPT codes allowed:  eval, self care, manual therapy, therapeutic exercise    Authorization - Visit Number  9    Authorization - Number of Visits  14    OT Start Time  1441 pt was in restroom at beginning of session    OT Stop Time  1516    OT Time Calculation (min)  35 min    Behavior During Therapy  [facility] for tasks assessed/performed       Past Medical History:  Diagnosis Date  . Anxiety   . Arthritis   . Diabetes mellitus without complication ([place])   . Hepatitis    PAST  . Hypertension   . Neuropathy   . Sinus complaint   . Sleep apnea    CPAP  . Thalassemia minor     Past Surgical History:  Procedure Laterality Date  . ABDOMINAL HYSTERECTOMY    . CATARACT EXTRACTION W/PHACO Left 06/11/2016   Procedure: CATARACT EXTRACTION PHACO AND INTRAOCULAR LENS PLACEMENT (IOC);  Surgeon: Estill Cotta, MD;  Location: [facility] ORS;  Service: Ophthalmology;  Laterality: Left;  Lot # [id number] H US:01:38.6 AP%:26.4 CDE:44.15  . COLONOSCOPIES    . CTR      There were no vitals filed for this visit.  Subjective Assessment - 02/10/18 1438    Subjective   S: I'm a little stiff today.     Currently in Pain?  No/denies         [facility] OT Assessment - 02/10/18 1438      Assessment   Medical Diagnosis  S/P Right Total Shoulder Replacement      Precautions   Precautions  None       Observation/Other Assessments   Focus on Therapeutic Outcomes (FOTO)   57/100      AROM   Overall AROM Comments  assessed in seated, external and internal rotation with shoulder adducted     AROM Assessment Site  Shoulder    Right/Left Shoulder  Right    Right Shoulder Flexion  130 Degrees 121 previous    Right Shoulder ABduction  128 Degrees 110 previous    Right Shoulder Internal Rotation  90 Degrees 85 previous    Right Shoulder External Rotation  45 Degrees 25 previous      PROM   Overall PROM Comments  assessed in supine, external rotation and internal rotation with shoulder adducted    PROM Assessment Site  Shoulder    Right/Left Shoulder  Right    Right Shoulder Flexion  145 Degrees same as previous    Right Shoulder ABduction  165 Degrees 105 previous    Right Shoulder Internal Rotation  90 Degrees 80 previous    Right Shoulder External Rotation  70 Degrees same as previous      Strength   Strength Assessment Site  Shoulder    Right/Left Shoulder  Right    Right Shoulder Flexion  4/5 3+/5 previous    Right Shoulder ABduction  4/5 3+/5 previous    Right Shoulder Internal Rotation  4/5 not previously assessed    Right Shoulder External Rotation  3+/5 4-/5 previous                OT Treatments/Exercises (OP) - 02/10/18 1438      Exercises   Exercises  Shoulder      Shoulder Exercises: Supine   Protraction  PROM;5 reps;AROM;15 reps    Horizontal ABduction  PROM;5 reps;AROM;15 reps    External Rotation  PROM;5 reps;AROM;15 reps    Internal Rotation  PROM;5 reps;AROM;15 reps    Flexion  PROM;5 reps;AROM;15 reps    ABduction  PROM;5 reps;AROM;15 reps      Shoulder Exercises: Seated   Protraction  AROM;12 reps    Horizontal ABduction  AROM;12 reps    External Rotation  AROM;12 reps    Flexion  AROM;12 reps    Abduction  AROM;12 reps      Manual Therapy   Manual Therapy  Myofascial release    Manual therapy comments  completed seperately from all  other skilled interventions    Myofascial Release  myofascial release and manual stretching to right upper arm, scapular, shoulder region  and scar region to decrease fascial restrictions and improve pain free mobility in right arm and shoulder               OT Short Term Goals - 02/10/18 1514      OT SHORT TERM GOAL #1   Title  Patient will be educated on HEP for improved RUE functional use.    Time  4    Period  Weeks    Status  On-going      OT SHORT TERM GOAL #2   Title  Patient will improve right shoulder P/ROM to [facility] in order to improve ease of donning and doffing clothing.     Time  4    Period  Weeks    Status  Achieved      OT SHORT TERM GOAL #3   Title  Patient will increase right shoulder strength to 4/5 in order to improve ability to lift bags of groceries.     Time  4    Period  Weeks    Status  Partially Met      OT SHORT TERM GOAL #4   Title  Patient will decrease fascial and scar restrictions from moderate to min moderate for improved mobility needed for increased participation in work tasks.     Time  4    Period  Weeks    Status  On-going      OT SHORT TERM GOAL #5   Title  Patient will decrease pain to 3/10 in right arm and shoulder during ADL completion.     Time  4    Period  Weeks    Status  Achieved        OT Long Term Goals - 01/14/18 1005      OT LONG TERM GOAL #1   Title  Patient will use her right arm as dominant with all B/IADLs, work, leisure, and driving tasks.     Time  8    Period  Weeks    Status  On-going      OT LONG TERM GOAL #2   Title  Patient will increase right shoulder A/ROM to [facility] in order to  reach behind back to fasten bra and reach overhead with out difficulty.     Time  8    Period  Weeks    Status  On-going      OT LONG TERM GOAL #3   Title  Patient will increase right shoulder strength to 5/5 in order to lift items at work and when completing leisure activities.     Time  8    Period  Weeks    Status   On-going      OT LONG TERM GOAL #4   Title  Patient will decrease pain to 2/10 or less in her right shoulder when reaching overhead.    Time  8    Period  Weeks    Status  On-going      OT LONG TERM GOAL #5   Title  Patient will decrese fascial restrictions to minimal in order to improve moblity needed to complete daily activities without pain.     Time  8    Period  Weeks    Status  On-going            Plan - 02/10/18 1514    Clinical Impression Statement  A: Pt reports HEP is going well, does not complete daily. Mini-reassessment completed this session, pt has met 2 STGs and is progressing towards remaining goals. Continued with A/ROM this session working towards ROM [facility] focusing on form. Verbal cuing for form and technique during session, occasional rest breaks for fatigue.     Plan  P: Resume missed exercises, attempt overhead lacing.       Patient will benefit from skilled therapeutic intervention in order to improve the following deficits and impairments:  Pain, Decreased scar mobility, Increased fascial restrictions, Increased muscle spasms, Decreased strength, Decreased range of motion, Impaired UE functional use  Visit Diagnosis: Stiffness of right shoulder, not elsewhere classified  Acute pain of right shoulder  Other symptoms and signs involving the musculoskeletal system    Problem List There are no active problems to display for this patient.  Guadelupe Sabin, OTR/L  [phone] 02/10/2018, 3:29 PM  [facility] [address] [place], [place], [address] Phone: [phone]   Fax:  [phone]  Name: Rachel Huber MRN: [id number] Date of Birth: 1956/04/21

## 2018-02-10 NOTE — Therapy (Signed)
[facility] [place], [place], [address] Phone: [phone]   Fax:  [phone]  Physical Therapy Treatment  Patient Details  Name: RUIE SENDEJO MRN: [id number] Date of Birth: May 10, 1956 Referring Provider: Dr. Frazier Richards   Encounter Date: 02/10/2018  PT End of Session - 02/10/18 1534    Visit Number  14    Number of Visits  17    Date for PT Re-Evaluation  02/18/18 Minireassess on 01/21/18    Authorization Type  [employer] (will update pt's personal health insurance info once obtained and include any visit limitations, etc.)    Authorization Time Period  12/24/17 to 02/18/18    PT Start Time  1520 bathroom break prior tx    PT Stop Time  1603    PT Time Calculation (min)  43 min    Activity Tolerance  Patient tolerated treatment well    Behavior During Therapy  [facility] for tasks assessed/performed       Past Medical History:  Diagnosis Date  . Anxiety   . Arthritis   . Diabetes mellitus without complication ([place])   . Hepatitis    PAST  . Hypertension   . Neuropathy   . Sinus complaint   . Sleep apnea    CPAP  . Thalassemia minor     Past Surgical History:  Procedure Laterality Date  . ABDOMINAL HYSTERECTOMY    . CATARACT EXTRACTION W/PHACO Left 06/11/2016   Procedure: CATARACT EXTRACTION PHACO AND INTRAOCULAR LENS PLACEMENT (IOC);  Surgeon: Estill Cotta, MD;  Location: [facility] ORS;  Service: Ophthalmology;  Laterality: Left;  Lot # [id number] H US:01:38.6 AP%:26.4 CDE:44.15  . COLONOSCOPIES    . CTR      There were no vitals filed for this visit.  Subjective Assessment - 02/10/18 1529    Subjective  Pt stated her shoulder is bothering, pain scale 3-4/10.      Patient Stated Goals  get off of the walker    Currently in Pain?  Yes    Pain Score  4     Pain Location  Shoulder    Pain Orientation  Right    Pain Descriptors / Indicators  Sore    Pain Type  Chronic pain    Pain Onset  More than a  month ago    Pain Frequency  Intermittent    Aggravating Factors   certain movements    Pain Relieving Factors  rest, heat/ice, pain medication    Effect of Pain on Daily Activities  minimal effect on ADLs                       [facility] Adult PT Treatment/Exercise - 02/10/18 1537      Knee/Hip Exercises: Machines for Strengthening   Cybex Knee Extension  Rt 1Pl 2x 10; Lt 2PL    Cybex Knee Flexion  3Pl 2x 10 singles    Cybex Leg Press  28 deg 2 x10 rep    Hip Cybex  Bil abd and hip extension with 3Pl 2x 10; given RW infront to improve posture with task      Knee/Hip Exercises: Standing   SLS  Bil 3x max Lt 24", Rt 25"     Other Standing Knee Exercises  fwd/retro monster walks GTB 45f x2RT      Shoulder Exercises: Supine   Protraction  --    Horizontal ABduction  --    External Rotation  --  Internal Rotation  --    Flexion  --    ABduction  --      Shoulder Exercises: Seated   Protraction  --    Horizontal ABduction  --    External Rotation  --    Flexion  --    Abduction  --      Manual Therapy   Manual Therapy  --    Manual therapy comments  --    Myofascial Release  --               PT Short Term Goals - 01/21/18 0904      PT SHORT TERM GOAL #1   Title  Pt will be independent wtih HEP and perform consistently in order to decrease pain and improve overall function.    Time  4    Period  Weeks    Status  Achieved      PT SHORT TERM GOAL #2   Title  Pt will have improved R knee ROM from 3-120deg without pain at end range to maximize gait.    Baseline  6/6: 5-123 (revised to state 3 deg extension becuase her L knee has 3 deg extension)    Time  4    Period  Weeks    Status  Partially Met      PT SHORT TERM GOAL #3   Title  Pt will have 1/2 grade improvement throughout BLE MMT in order to maximize gait, balance, and allow pt to complete functional tasks with greater ease.    Baseline  6/6: see MMT    Time  4    Period  Weeks    Status   Achieved      PT SHORT TERM GOAL #4   Title  Pt will be able to perform R SLS for 5 sec with no UE assist to demo improved balance and functional strengh in order to maximize gait and stair ambulation.    Baseline  6/6: 9.8 sec RLE    Time  4    Period  Weeks    Status  Achieved        PT Long Term Goals - 01/21/18 0263      PT LONG TERM GOAL #1   Title  Pt will have 1 grade improvement throughout BLE MMT in order to further maximize gait, balance, and promote return to PLOF.    Baseline  6/6: see MMT    Time  8    Period  Weeks    Status  On-going      PT LONG TERM GOAL #2   Title  Pt will be able to perform R SLS for 10 sec or > with no UE assist in order to further demo improved balance, functional strength and maximize overall function.    Baseline  6/6: 9.8sec RLE    Time  8    Period  Weeks    Status  On-going      PT LONG TERM GOAL #3   Title  Pt will be able to perform 5xSTS in 10 sec or < with no UE support and proper mechanics to demo improved balance and functional BLE strength.    Baseline  6/6: 18sec, no UE, off shifted from RLE    Time  8    Period  Weeks    Status  On-going      PT LONG TERM GOAL #4   Title  Pt will be able to perform the  TUG in 12 sec or < with LRAD in order to demo improved balance and decrease her risk for falls.    Baseline  6/6: 13.5, no UE    Time  8    Period  Weeks    Status  On-going      PT LONG TERM GOAL #5   Title  Pt will be able to ambulate at least 642f or > during the 3MWT with LRAD and without increases in pain to demo improved tolerance to WB and maximize her ability to return to work once cleared by MD to do so.    Baseline  6/6: 4863fno AD, antalgic and trendelenberg sign gait deviations    Time  8    Period  Weeks    Status  On-going            Plan - 02/10/18 1544    Clinical Impression Statement  Continued session focus with current POC for LE strengthening.  Progressed to cybex hip strengthening machine  for gluteal strengthening, added RW infront of machine to improve posture and form wiht activity.  Pt wiht most difficulty with Lt LE knee and hip extension with activites due to weakness.  No reports of increased pain through session.      Rehab Potential  Good    PT Frequency  2x / week    PT Duration  8 weeks    PT Treatment/Interventions  ADLs/Self Care Home Management;Biofeedback;Cryotherapy;Electrical Stimulation;Moist Heat;Ultrasound;DME Instruction;Gait training;Stair training;Functional mobility training;Therapeutic activities;Therapeutic exercise;Balance training;Neuromuscular re-education;Patient/family education;Orthotic Fit/Training;Manual techniques;Scar mobilization;Passive range of motion;Dry needling;Energy conservation;Taping    PT Next Visit Plan  continue cybex machines, leg press, and 4-way hip cable strengthening; continue to progress LE and functional strengthening, gait with no AD outside. continue balance on compliant surface    PT Home Exercise Plan  eval: quad sets, heel slides; 6/6: sidelying hip and, clams with RTB; 6/13: TKE, mini squats, sidestep with GTB, fwd/lat step ups       Patient will benefit from skilled therapeutic intervention in order to improve the following deficits and impairments:  Abnormal gait, Decreased activity tolerance, Decreased balance, Decreased endurance, Decreased mobility, Decreased range of motion, Decreased strength, Difficulty walking, Hypomobility, Increased fascial restricitons, Increased muscle spasms, Impaired flexibility, Improper body mechanics, Impaired UE functional use, Postural dysfunction, Pain  Visit Diagnosis: Difficulty in walking, not elsewhere classified  Muscle weakness (generalized)  Chronic pain of right knee  Pain in right ankle and joints of right foot     Problem List There are no active problems to display for this patient.  [id number] Van Dyke RoadLPTA; CBIS [phone] Lento/26/2019, 5:03  PM  [id number]: [phone] Fax:  [phone]: AnJIMMI SIDENERRN: [id number] of Birth: 4/18-Jun-1956

## 2018-02-11 ENCOUNTER — Encounter (HOSPITAL_COMMUNITY): Payer: 59 | Admitting: Occupational Therapy

## 2018-02-12 ENCOUNTER — Encounter (HOSPITAL_COMMUNITY): Payer: Self-pay | Admitting: Occupational Therapy

## 2018-02-12 ENCOUNTER — Ambulatory Visit (HOSPITAL_COMMUNITY): Payer: 59 | Admitting: Occupational Therapy

## 2018-02-12 DIAGNOSIS — M25511 Pain in right shoulder: Secondary | ICD-10-CM

## 2018-02-12 DIAGNOSIS — R29898 Other symptoms and signs involving the musculoskeletal system: Secondary | ICD-10-CM

## 2018-02-12 DIAGNOSIS — M25611 Stiffness of right shoulder, not elsewhere classified: Secondary | ICD-10-CM

## 2018-02-12 DIAGNOSIS — R262 Difficulty in walking, not elsewhere classified: Secondary | ICD-10-CM | POA: Diagnosis not present

## 2018-02-12 NOTE — Therapy (Signed)
[facility] [place], [place], [address] Phone: [phone]   Fax:  [phone]  Occupational Therapy Treatment  Patient Details  Name: Rachel Huber MRN: [id number] Date of Birth: [birth year] Referring Provider: Dr. Frazier Richards   Encounter Date: 02/12/2018  OT End of Session - 02/12/18 1441    Visit Number  10    Number of Visits  16    Date for OT Re-Evaluation  03/08/18    Authorization Type  [employer] - following CPT codes allowed:  eval, self care, manual therapy, therapeutic exercise    Authorization - Visit Number  10    Authorization - Number of Visits  60    OT Start Time  5277    OT Stop Time  1440    OT Time Calculation (min)  47 min    Behavior During Therapy  [facility] for tasks assessed/performed       Past Medical History:  Diagnosis Date  . Anxiety   . Arthritis   . Diabetes mellitus without complication ([place])   . Hepatitis    PAST  . Hypertension   . Neuropathy   . Sinus complaint   . Sleep apnea    CPAP  . Thalassemia minor     Past Surgical History:  Procedure Laterality Date  . ABDOMINAL HYSTERECTOMY    . CATARACT EXTRACTION W/PHACO Left 06/11/2016   Procedure: CATARACT EXTRACTION PHACO AND INTRAOCULAR LENS PLACEMENT (IOC);  Surgeon: Estill Cotta, MD;  Location: [facility] ORS;  Service: Ophthalmology;  Laterality: Left;  Lot # [id number] H US:01:38.6 AP%:26.4 CDE:44.15  . COLONOSCOPIES    . CTR      There were no vitals filed for this visit.  Subjective Assessment - 02/12/18 1354    Subjective   S: I'm trying to use this arm as much as I can.     Currently in Pain?  Yes    Pain Score  3     Pain Location  Shoulder    Pain Orientation  Right    Pain Descriptors / Indicators  Sore    Pain Type  Chronic pain    Pain Radiating Towards  N/A    Pain Onset  More than a month ago    Pain Frequency  Intermittent    Aggravating Factors   certain movements    Pain Relieving Factors  rest,  heat/ice, pain medications    Effect of Pain on Daily Activities  minimal effect on ADLs    Multiple Pain Sites  No         [facility] OT Assessment - 02/12/18 1354      Assessment   Medical Diagnosis  S/P Right Total Shoulder Replacement      Precautions   Precautions  None               OT Treatments/Exercises (OP) - 02/12/18 1355      Exercises   Exercises  Shoulder      Shoulder Exercises: Supine   Protraction  PROM;5 reps;AROM;15 reps    Horizontal ABduction  PROM;5 reps;AROM;15 reps    External Rotation  PROM;5 reps;AROM;15 reps    Internal Rotation  PROM;5 reps;AROM;15 reps    Flexion  PROM;5 reps;AROM;15 reps    ABduction  PROM;5 reps;AROM;15 reps      Shoulder Exercises: Seated   Protraction  AROM;15 reps    Horizontal ABduction  AROM;15 reps    External Rotation  AROM;15 reps    Flexion  AROM;15 reps    Abduction  AROM;15 reps      Shoulder Exercises: Standing   Extension  Theraband;10 reps    Theraband Level (Shoulder Extension)  Level 2 (Red)    Row  Theraband;10 reps    Theraband Level (Shoulder Row)  Level 2 (Red)    Retraction  Theraband;10 reps    Theraband Level (Shoulder Retraction)  Level 2 (Red)      Shoulder Exercises: ROM/Strengthening   Proximal Shoulder Strengthening, Supine  12X each no rest breaks      Manual Therapy   Manual Therapy  Myofascial release    Manual therapy comments  completed seperately from all other skilled interventions    Myofascial Release  myofascial release and manual stretching to right upper arm, scapular, shoulder region  and scar region to decrease fascial restrictions and improve pain free mobility in right arm and shoulder               OT Short Term Goals - 02/10/18 1514      OT SHORT TERM GOAL #1   Title  Patient will be educated on HEP for improved RUE functional use.    Time  4    Period  Weeks    Status  On-going      OT SHORT TERM GOAL #2   Title  Patient will improve right shoulder  P/ROM to [facility] in order to improve ease of donning and doffing clothing.     Time  4    Period  Weeks    Status  Achieved      OT SHORT TERM GOAL #3   Title  Patient will increase right shoulder strength to 4/5 in order to improve ability to lift bags of groceries.     Time  4    Period  Weeks    Status  Partially Met      OT SHORT TERM GOAL #4   Title  Patient will decrease fascial and scar restrictions from moderate to min moderate for improved mobility needed for increased participation in work tasks.     Time  4    Period  Weeks    Status  On-going      OT SHORT TERM GOAL #5   Title  Patient will decrease pain to 3/10 in right arm and shoulder during ADL completion.     Time  4    Period  Weeks    Status  Achieved        OT Long Term Goals - 01/14/18 1005      OT LONG TERM GOAL #1   Title  Patient will use her right arm as dominant with all B/IADLs, work, leisure, and driving tasks.     Time  8    Period  Weeks    Status  On-going      OT LONG TERM GOAL #2   Title  Patient will increase right shoulder A/ROM to [facility] in order to reach behind back to fasten bra and reach overhead with out difficulty.     Time  8    Period  Weeks    Status  On-going      OT LONG TERM GOAL #3   Title  Patient will increase right shoulder strength to 5/5 in order to lift items at work and when completing leisure activities.     Time  8    Period  Weeks    Status  On-going  OT LONG TERM GOAL #4   Title  Patient will decrease pain to 2/10 or less in her right shoulder when reaching overhead.    Time  8    Period  Weeks    Status  On-going      OT LONG TERM GOAL #5   Title  Patient will decrese fascial restrictions to minimal in order to improve moblity needed to complete daily activities without pain.     Time  8    Period  Weeks    Status  On-going            Plan - 02/12/18 1428    Clinical Impression Statement  A: Continued with manual therapy to address fascial  restrictions limiting ROM. Resumed missed exercises today including scapular theraband exercises and proximal shoulder strengthening. Intermittent rest breaks for fatigue. Verbal cuing for form and technique.      Plan  P: d/c supine A/ROM, continued scapular theraband, add overhead lacing, add functional reaching task       Patient will benefit from skilled therapeutic intervention in order to improve the following deficits and impairments:  Pain, Decreased scar mobility, Increased fascial restrictions, Increased muscle spasms, Decreased strength, Decreased range of motion, Impaired UE functional use  Visit Diagnosis: Stiffness of right shoulder, not elsewhere classified  Acute pain of right shoulder  Other symptoms and signs involving the musculoskeletal system    Problem List There are no active problems to display for this patient.   Guadelupe Sabin, OTR/L  [phone] 02/12/2018, 2:42 PM  [facility] [address] [place], [place], [address] Phone: [phone]   Fax:  [phone]  Name: Rachel Huber MRN: [id number] Date of Birth: 05-27-1956

## 2018-02-15 ENCOUNTER — Other Ambulatory Visit: Payer: Self-pay | Admitting: Obstetrics and Gynecology

## 2018-02-15 ENCOUNTER — Ambulatory Visit
Admission: RE | Admit: 2018-02-15 | Discharge: 2018-02-15 | Disposition: A | Discharge status: Home / Self Care | Payer: 59 | Source: Ambulatory Visit | Attending: Obstetrics and Gynecology | Admitting: Obstetrics and Gynecology

## 2018-02-15 DIAGNOSIS — N6310 Unspecified lump in the right breast, unspecified quadrant: Secondary | ICD-10-CM

## 2018-02-15 DIAGNOSIS — R928 Other abnormal and inconclusive findings on diagnostic imaging of breast: Secondary | ICD-10-CM

## 2018-02-15 HISTORY — PX: BREAST BIOPSY: SHX20

## 2018-02-16 ENCOUNTER — Ambulatory Visit (HOSPITAL_COMMUNITY): Payer: 59 | Attending: Internal Medicine

## 2018-02-16 ENCOUNTER — Encounter (HOSPITAL_COMMUNITY): Payer: Self-pay

## 2018-02-16 ENCOUNTER — Ambulatory Visit (HOSPITAL_COMMUNITY): Payer: 59

## 2018-02-16 ENCOUNTER — Other Ambulatory Visit: Payer: Self-pay

## 2018-02-16 DIAGNOSIS — M25611 Stiffness of right shoulder, not elsewhere classified: Secondary | ICD-10-CM | POA: Diagnosis present

## 2018-02-16 DIAGNOSIS — M25511 Pain in right shoulder: Secondary | ICD-10-CM | POA: Diagnosis present

## 2018-02-16 DIAGNOSIS — M6281 Muscle weakness (generalized): Secondary | ICD-10-CM

## 2018-02-16 DIAGNOSIS — R262 Difficulty in walking, not elsewhere classified: Secondary | ICD-10-CM

## 2018-02-16 DIAGNOSIS — R29898 Other symptoms and signs involving the musculoskeletal system: Secondary | ICD-10-CM

## 2018-02-16 DIAGNOSIS — G8929 Other chronic pain: Secondary | ICD-10-CM

## 2018-02-16 DIAGNOSIS — M25561 Pain in right knee: Secondary | ICD-10-CM | POA: Diagnosis present

## 2018-02-16 DIAGNOSIS — M25571 Pain in right ankle and joints of right foot: Secondary | ICD-10-CM | POA: Diagnosis present

## 2018-02-16 LAB — SURGICAL PATHOLOGY

## 2018-02-16 NOTE — Therapy (Signed)
[place], [place], [address] Phone: [phone]   Fax:  [phone]  Occupational Therapy Treatment  Patient Details  Name: Rachel Huber MRN: [id number] Date of Birth: 1955-08-30 Referring Provider: Dr. Frazier Richards   Encounter Date: 02/16/2018  OT End of Session - 02/16/18 1019    Visit Number  11    Number of Visits  16    Date for OT Re-Evaluation  03/08/18    Authorization Type  [employer] - following CPT codes allowed:  eval, self care, manual therapy, therapeutic exercise    Authorization - Visit Number  11    Authorization - Number of Visits  60    OT Start Time  [id number]    OT Stop Time  1028    OT Time Calculation (min)  39 min    Activity Tolerance  Patient tolerated treatment well    Behavior During Therapy  [facility] for tasks assessed/performed       Past Medical History:  Diagnosis Date  . Anxiety   . Arthritis   . Diabetes mellitus without complication ([place])   . Hepatitis    PAST  . Hypertension   . Neuropathy   . Sinus complaint   . Sleep apnea    CPAP  . Thalassemia minor     Past Surgical History:  Procedure Laterality Date  . ABDOMINAL HYSTERECTOMY    . BREAST BIOPSY Right 02/15/2018   [place] bx 6-6:30 ribbon shape, path pending  . BREAST BIOPSY Right 02/15/2018   [place] b[place] 9:00 heart shape, path pending  . CATARACT EXTRACTION W/PHACO Left 06/11/2016   Procedure: CATARACT EXTRACTION PHACO AND INTRAOCULAR LENS PLACEMENT (IOC);  Surgeon: Estill Cotta, MD;  Location: [facility] ORS;  Service: Ophthalmology;  Laterality: Left;  Lot # [id number] H US:01:38.6 AP%:26.4 CDE:44.15  . COLONOSCOPIES    . CTR      There were no vitals filed for this visit.  Subjective Assessment - 02/16/18 1016    Subjective   S: It just gets to a certain point and it just won't go. The doctor says it may never go.    Currently in Pain?  Yes    Pain Score  4     Pain Location  Shoulder    Pain Orientation  Right    Pain Descriptors / Indicators  Sore;Aching    Pain Type  Chronic pain    Pain Radiating Towards  elbow    Pain Onset  More than a month ago    Pain Frequency  Intermittent    Aggravating Factors   certain movements    Pain Relieving Factors  rest, heat/ice, pain medications    Effect of Pain on Daily Activities  minimal effect on ADLs    Multiple Pain Sites  No         [facility] OT Assessment - 02/16/18 0950      Assessment   Medical Diagnosis  S/P Right Total Shoulder Replacement      Precautions   Precautions  None               OT Treatments/Exercises (OP) - 02/16/18 0950      Exercises   Exercises  Shoulder      Shoulder Exercises: Supine   Protraction  AROM;5 reps    Horizontal ABduction  PROM;5 reps    External Rotation  PROM;5 reps    Internal Rotation  PROM;5 reps    Flexion  PROM;5 reps  ABduction  PROM;5 reps      Shoulder Exercises: Seated   Protraction  AROM;15 reps    Horizontal ABduction  AROM;15 reps    External Rotation  AROM;15 reps    Internal Rotation  AROM;15 reps    Flexion  AROM;15 reps    Abduction  AROM;15 reps      Shoulder Exercises: Standing   Extension  Theraband;15 reps    Theraband Level (Shoulder Extension)  Level 2 (Red)    Row  Theraband;15 reps    Theraband Level (Shoulder Row)  Level 2 (Red)    Retraction  Theraband;15 reps    Theraband Level (Shoulder Retraction)  Level 2 (Red)      Shoulder Exercises: ROM/Strengthening   X to V Arms  15X    Proximal Shoulder Strengthening, Seated  15X no rest breaks      Functional Reaching Activities   Mid Level  Patient placed 20 cones at shoulder height and took them down with 1/2# wrist weight      Manual Therapy   Manual Therapy  Myofascial release    Manual therapy comments  completed seperately from all other skilled interventions    Myofascial Release  myofascial release and manual stretching to right upper arm, scapular, shoulder region  and scar region to decrease  fascial restrictions and improve pain free mobility in right arm and shoulder             OT Education - 02/16/18 1019    Education provided  No       OT Short Term Goals - 02/16/18 1021      OT SHORT TERM GOAL #1   Title  Patient will be educated on HEP for improved RUE functional use.    Time  4    Period  Weeks    Status  On-going      OT SHORT TERM GOAL #2   Title  Patient will improve right shoulder P/ROM to [facility] in order to improve ease of donning and doffing clothing.     Time  4    Period  Weeks      OT SHORT TERM GOAL #3   Title  Patient will increase right shoulder strength to 4/5 in order to improve ability to lift bags of groceries.     Time  4    Period  Weeks    Status  Partially Met      OT SHORT TERM GOAL #4   Title  Patient will decrease fascial and scar restrictions from moderate to min moderate for improved mobility needed for increased participation in work tasks.     Time  4    Period  Weeks    Status  On-going      OT SHORT TERM GOAL #5   Title  Patient will decrease pain to 3/10 in right arm and shoulder during ADL completion.     Time  4    Period  Weeks        OT Long Term Goals - 01/14/18 1005      OT LONG TERM GOAL #1   Title  Patient will use her right arm as dominant with all B/IADLs, work, leisure, and driving tasks.     Time  8    Period  Weeks    Status  On-going      OT LONG TERM GOAL #2   Title  Patient will increase right shoulder A/ROM to [facility] in order to reach  behind back to fasten bra and reach overhead with out difficulty.     Time  8    Period  Weeks    Status  On-going      OT LONG TERM GOAL #3   Title  Patient will increase right shoulder strength to 5/5 in order to lift items at work and when completing leisure activities.     Time  8    Period  Weeks    Status  On-going      OT LONG TERM GOAL #4   Title  Patient will decrease pain to 2/10 or less in her right shoulder when reaching overhead.    Time  8     Period  Weeks    Status  On-going      OT LONG TERM GOAL #5   Title  Patient will decrese fascial restrictions to minimal in order to improve moblity needed to complete daily activities without pain.     Time  8    Period  Weeks    Status  On-going            Plan - 02/16/18 1025    Clinical Impression Statement  A: Continued with manual therapy to address fascial restrictions limiting ROM. Patient reporting she is not supposed to lift her arm overhead due to a biopsy she had yesterday. All exercises limited to just above shoulder height. Patient continued with seated AROM and scapular theraband. Mid level functional reaching task added with light wrist weight. Patient experiencing minimal fatigue. Min VC for technique.    Plan  P: Continue with manual techniques to address fascial restrictions in upper arm. Progress to strengthening in supine. Add overhead lacing and functional reaching task.    Consulted and Agree with Plan of Care  Patient       Patient will benefit from skilled therapeutic intervention in order to improve the following deficits and impairments:  Pain, Decreased scar mobility, Increased fascial restrictions, Increased muscle spasms, Decreased strength, Decreased range of motion, Impaired UE functional use  Visit Diagnosis: Stiffness of right shoulder, not elsewhere classified  Acute pain of right shoulder  Other symptoms and signs involving the musculoskeletal system    Problem List There are no active problems to display for this patient.   Roderic Palau, OT student 02/16/2018, 10:35 AM  [place], [place], [address] Phone: [phone]   Fax:  [phone]  Name: Rachel Huber MRN: [id number] Date of Birth: 1955/12/18

## 2018-02-16 NOTE — Therapy (Signed)
[place], [place], [address] Phone: [phone]   Fax:  [phone]  Physical Therapy Treatment  Patient Details  Name: Rachel Huber MRN: [id number] Date of Birth: 1956/05/27 Referring Provider: Dr. Frazier Richards   Encounter Date: 02/16/2018  PT End of Session - 02/16/18 0858    Visit Number  15    Number of Visits  17    Date for PT Re-Evaluation  02/18/18 Minireassess on 01/21/18    Authorization Type  [employer] (will update pt's personal health insurance info once obtained and include any visit limitations, etc.)    Authorization Time Period  12/24/17 to 02/18/18    PT Start Time  0857    PT Stop Time  0939    PT Time Calculation (min)  42 min    Activity Tolerance  Patient tolerated treatment well    Behavior During Therapy  [facility] for tasks assessed/performed       Past Medical History:  Diagnosis Date  . Anxiety   . Arthritis   . Diabetes mellitus without complication ([place])   . Hepatitis    PAST  . Hypertension   . Neuropathy   . Sinus complaint   . Sleep apnea    CPAP  . Thalassemia minor     Past Surgical History:  Procedure Laterality Date  . ABDOMINAL HYSTERECTOMY    . BREAST BIOPSY Right 02/15/2018   [place] bx 6-6:30 ribbon shape, path pending  . BREAST BIOPSY Right 02/15/2018   [place] bx 9:00 heart shape, path pending  . CATARACT EXTRACTION W/PHACO Left 06/11/2016   Procedure: CATARACT EXTRACTION PHACO AND INTRAOCULAR LENS PLACEMENT (IOC);  Surgeon: Estill Cotta, MD;  Location: [facility] ORS;  Service: Ophthalmology;  Laterality: Left;  Lot # [id number] H US:01:38.6 AP%:26.4 CDE:44.15  . COLONOSCOPIES    . CTR      There were no vitals filed for this visit.  Subjective Assessment - 02/16/18 0858    Subjective  Pt reports that she is feeling good this morning, other than it being early in the morning.     Patient Stated Goals  get off of the walker    Currently in Pain?  No/denies     Pain Onset  More than a month ago             [facility] Adult PT Treatment/Exercise - 02/16/18 0001      Knee/Hip Exercises: Stretches   Gastroc Stretch  Both;3 reps;30 seconds;Limitations    Gastroc Stretch Limitations  slant board      Knee/Hip Exercises: Machines for Strengthening   Cybex Knee Extension  Rt 1Pl 2x 10; Lt 2PL x20 reps    Cybex Knee Flexion  3Pl 2x 10 singles    Hip Cybex  4-way hip with cables: 2plates, 2x10 RLE only      Knee/Hip Exercises: Standing   Forward Lunges  Both;10 reps    Forward Lunges Limitations  mini lunge, feet on floor    Wall Squat  2 sets;10 reps;Limitations    Wall Squat Limitations  1st set sits x5" holds; 2nd set squats           PT Education - 02/16/18 0858    Education provided  Yes    Education Details  exercise technique, updated strengthening HEP    Person(s) Educated  Patient    Methods  Explanation;Demonstration;Handout    Comprehension  Verbalized understanding;Returned demonstration       PT Short  Term Goals - 01/21/18 0904      PT SHORT TERM GOAL #1   Title  Pt will be independent wtih HEP and perform consistently in order to decrease pain and improve overall function.    Time  4    Period  Weeks    Status  Achieved      PT SHORT TERM GOAL #2   Title  Pt will have improved R knee ROM from 3-120deg without pain at end range to maximize gait.    Baseline  6/6: 5-123 (revised to state 3 deg extension becuase her L knee has 3 deg extension)    Time  4    Period  Weeks    Status  Partially Met      PT SHORT TERM GOAL #3   Title  Pt will have 1/2 grade improvement throughout BLE MMT in order to maximize gait, balance, and allow pt to complete functional tasks with greater ease.    Baseline  6/6: see MMT    Time  4    Period  Weeks    Status  Achieved      PT SHORT TERM GOAL #4   Title  Pt will be able to perform R SLS for 5 sec with no UE assist to demo improved balance and functional strengh in order to maximize  gait and stair ambulation.    Baseline  6/6: 9.8 sec RLE    Time  4    Period  Weeks    Status  Achieved        PT Long Term Goals - 01/21/18 9147      PT LONG TERM GOAL #1   Title  Pt will have 1 grade improvement throughout BLE MMT in order to further maximize gait, balance, and promote return to PLOF.    Baseline  6/6: see MMT    Time  8    Period  Weeks    Status  On-going      PT LONG TERM GOAL #2   Title  Pt will be able to perform R SLS for 10 sec or > with no UE assist in order to further demo improved balance, functional strength and maximize overall function.    Baseline  6/6: 9.8sec RLE    Time  8    Period  Weeks    Status  On-going      PT LONG TERM GOAL #3   Title  Pt will be able to perform 5xSTS in 10 sec or < with no UE support and proper mechanics to demo improved balance and functional BLE strength.    Baseline  6/6: 18sec, no UE, off shifted from RLE    Time  8    Period  Weeks    Status  On-going      PT LONG TERM GOAL #4   Title  Pt will be able to perform the TUG in 12 sec or < with LRAD in order to demo improved balance and decrease her risk for falls.    Baseline  6/6: 13.5, no UE    Time  8    Period  Weeks    Status  On-going      PT LONG TERM GOAL #5   Title  Pt will be able to ambulate at least 673f or > during the 3MWT with LRAD and without increases in pain to demo improved tolerance to WB and maximize her ability to return to work once cleared by  MD to do so.    Baseline  6/6: 45f no AD, antalgic and trendelenberg sign gait deviations    Time  8    Period  Weeks    Status  On-going            Plan - 02/16/18 0940    Clinical Impression Statement  Continued with established POC focusing on BLE and functional strengthening. Pt tolerating session well, not reporting any pain. PT updated her strengthening HEP this date. Pt due for reassessment and discharge next visit due to progress made.     Rehab Potential  Good    PT Frequency   2x / week    PT Duration  8 weeks    PT Treatment/Interventions  ADLs/Self Care Home Management;Biofeedback;Cryotherapy;Electrical Stimulation;Moist Heat;Ultrasound;DME Instruction;Gait training;Stair training;Functional mobility training;Therapeutic activities;Therapeutic exercise;Balance training;Neuromuscular re-education;Patient/family education;Orthotic Fit/Training;Manual techniques;Scar mobilization;Passive range of motion;Dry needling;Energy conservation;Taping    PT Next Visit Plan  reassess and discharge    PT Home Exercise Plan  eval: quad sets, heel slides; 6/6: sidelying hip and, clams with RTB; 6/13: TKE, mini squats, sidestep with GTB, fwd/lat step ups; 7/2: knee extension and flexion machines, 4-way hip at cable column, mini lunging, mini squats, wall squats    Consulted and Agree with Plan of Care  Patient       Patient will benefit from skilled therapeutic intervention in order to improve the following deficits and impairments:  Abnormal gait, Decreased activity tolerance, Decreased balance, Decreased endurance, Decreased mobility, Decreased range of motion, Decreased strength, Difficulty walking, Hypomobility, Increased fascial restricitons, Increased muscle spasms, Impaired flexibility, Improper body mechanics, Impaired UE functional use, Postural dysfunction, Pain  Visit Diagnosis: Difficulty in walking, not elsewhere classified  Muscle weakness (generalized)  Chronic pain of right knee  Pain in right ankle and joints of right foot     Problem List There are no active problems to display for this patient.      BGeraldine SolarPT, DEast [place] Bon[place] [address]Phone: [phone]  Fax:  [phone] Name: Rachel BARKOWMRN: [id number] of Birth: 4August 17, 1957

## 2018-02-17 ENCOUNTER — Other Ambulatory Visit: Payer: Self-pay

## 2018-02-17 ENCOUNTER — Ambulatory Visit (HOSPITAL_COMMUNITY): Payer: 59

## 2018-02-17 DIAGNOSIS — R262 Difficulty in walking, not elsewhere classified: Secondary | ICD-10-CM | POA: Diagnosis not present

## 2018-02-17 DIAGNOSIS — M25611 Stiffness of right shoulder, not elsewhere classified: Secondary | ICD-10-CM

## 2018-02-17 DIAGNOSIS — M25571 Pain in right ankle and joints of right foot: Secondary | ICD-10-CM

## 2018-02-17 DIAGNOSIS — G8929 Other chronic pain: Secondary | ICD-10-CM

## 2018-02-17 DIAGNOSIS — M25511 Pain in right shoulder: Secondary | ICD-10-CM

## 2018-02-17 DIAGNOSIS — R29898 Other symptoms and signs involving the musculoskeletal system: Secondary | ICD-10-CM

## 2018-02-17 DIAGNOSIS — M6281 Muscle weakness (generalized): Secondary | ICD-10-CM

## 2018-02-17 DIAGNOSIS — M25561 Pain in right knee: Secondary | ICD-10-CM

## 2018-02-17 NOTE — Therapy (Addendum)
[place], [place], [address] Phone: [phone]   Fax:  [phone]  Occupational Therapy Treatment  Patient Details  Name: Rachel Huber MRN: [id number] Date of Birth: [birth year] Referring Provider: Dr. Frazier Richards   Encounter Date: 02/17/2018  OT End of Session - 02/17/18 1410    Visit Number  12    Number of Visits  16    Date for OT Re-Evaluation  03/08/18    Authorization Type  [employer] - following CPT codes allowed:  eval, self care, manual therapy, therapeutic exercise    Authorization - Visit Number  12    Authorization - Number of Visits  60    OT Start Time  9191    OT Stop Time  1427    OT Time Calculation (min)  40 min    Activity Tolerance  Patient tolerated treatment well    Behavior During Therapy  [facility] for tasks assessed/performed       Past Medical History:  Diagnosis Date  . Anxiety   . Arthritis   . Diabetes mellitus without complication ([place])   . Hepatitis    PAST  . Hypertension   . Neuropathy   . Sinus complaint   . Sleep apnea    CPAP  . Thalassemia minor     Past Surgical History:  Procedure Laterality Date  . ABDOMINAL HYSTERECTOMY    . BREAST BIOPSY Right 02/15/2018   [place] bx 6-6:30 ribbon shape, path pending  . BREAST BIOPSY Right 02/15/2018   [place] bx 9:00 heart shape, path pending  . CATARACT EXTRACTION W/PHACO Left 06/11/2016   Procedure: CATARACT EXTRACTION PHACO AND INTRAOCULAR LENS PLACEMENT (IOC);  Surgeon: Estill Cotta, MD;  Location: [facility] ORS;  Service: Ophthalmology;  Laterality: Left;  Lot # [id number] H US:01:38.6 AP%:26.4 CDE:44.15  . COLONOSCOPIES    . CTR      There were no vitals filed for this visit.  Subjective Assessment - 02/17/18 1351    Subjective   S: For some reason today it is just feeling really good.    Currently in Pain?  Yes    Pain Score  1     Pain Location  Shoulder    Pain Orientation  Right    Pain Descriptors / Indicators   Sore;Aching    Pain Type  Chronic pain    Pain Onset  More than a month ago    Pain Frequency  Intermittent    Aggravating Factors   certain movements    Pain Relieving Factors  rest, heat/ice, pain medications    Effect of Pain on Daily Activities  minmal effect on ADLs    Multiple Pain Sites  No         [facility] OT Assessment - 02/17/18 1348      Assessment   Medical Diagnosis  S/P Right Total Shoulder Replacement      Precautions   Precautions  None               OT Treatments/Exercises (OP) - 02/17/18 1348      Exercises   Exercises  Shoulder      Shoulder Exercises: Supine   Protraction  PROM;5 reps;Strengthening;10 reps    Protraction Weight (lbs)  1    Horizontal ABduction  PROM;5 reps;Strengthening;10 reps    Horizontal ABduction Weight (lbs)  1    External Rotation  PROM;5 reps;Strengthening;10 reps    External Rotation Weight (lbs)  1  Internal Rotation  PROM;5 reps;Strengthening;10 reps    Internal Rotation Weight (lbs)  1    Flexion  PROM;5 reps;Strengthening;10 reps    Shoulder Flexion Weight (lbs)  1    ABduction  PROM;5 reps;Strengthening;10 reps    Shoulder ABduction Weight (lbs)  1      Shoulder Exercises: Seated   Protraction  Strengthening;10 reps    Protraction Weight (lbs)  1    Horizontal ABduction  Strengthening;10 reps    Horizontal ABduction Weight (lbs)  1    External Rotation  Strengthening;10 reps    External Rotation Weight (lbs)  1    Internal Rotation  Strengthening;10 reps    Internal Rotation Weight (lbs)  1    Flexion  Strengthening;10 reps    Flexion Weight (lbs)  1    Abduction  Strengthening;10 reps    ABduction Weight (lbs)  1      Shoulder Exercises: ROM/Strengthening   Over Head Lace  1'    Caudal Glide  10X; 1# no rest breaks    Proximal Shoulder Strengthening, Supine  10X; 1# no rest breaks      Manual Therapy   Manual Therapy  Myofascial release    Manual therapy comments  completed seperately from all  other skilled interventions    Myofascial Release  myofascial release and manual stretching to right upper arm, scapular, shoulder region  and scar region to decrease fascial restrictions and improve pain free mobility in right arm and shoulder             OT Education - 02/17/18 1409    Education provided  Yes    Education Details  Patient instructed to add weight to shoulder exercises in HEP.    Person(s) Educated  Patient    Methods  Explanation;Demonstration;Handout;Verbal cues    Comprehension  Verbalized understanding;Returned demonstration       OT Short Term Goals - 02/16/18 1021      OT SHORT TERM GOAL #1   Title  Patient will be educated on HEP for improved RUE functional use.    Time  4    Period  Weeks    Status  On-going      OT SHORT TERM GOAL #2   Title  Patient will improve right shoulder P/ROM to [facility] in order to improve ease of donning and doffing clothing.     Time  4    Period  Weeks      OT SHORT TERM GOAL #3   Title  Patient will increase right shoulder strength to 4/5 in order to improve ability to lift bags of groceries.     Time  4    Period  Weeks    Status  Partially Met      OT SHORT TERM GOAL #4   Title  Patient will decrease fascial and scar restrictions from moderate to min moderate for improved mobility needed for increased participation in work tasks.     Time  4    Period  Weeks    Status  On-going      OT SHORT TERM GOAL #5   Title  Patient will decrease pain to 3/10 in right arm and shoulder during ADL completion.     Time  4    Period  Weeks        OT Long Term Goals - 01/14/18 1005      OT LONG TERM GOAL #1   Title  Patient will use her right  arm as dominant with all B/IADLs, work, leisure, and driving tasks.     Time  8    Period  Weeks    Status  On-going      OT LONG TERM GOAL #2   Title  Patient will increase right shoulder A/ROM to [facility] in order to reach behind back to fasten bra and reach overhead with out  difficulty.     Time  8    Period  Weeks    Status  On-going      OT LONG TERM GOAL #3   Title  Patient will increase right shoulder strength to 5/5 in order to lift items at work and when completing leisure activities.     Time  8    Period  Weeks    Status  On-going      OT LONG TERM GOAL #4   Title  Patient will decrease pain to 2/10 or less in her right shoulder when reaching overhead.    Time  8    Period  Weeks    Status  On-going      OT LONG TERM GOAL #5   Title  Patient will decrese fascial restrictions to minimal in order to improve moblity needed to complete daily activities without pain.     Time  8    Period  Weeks    Status  On-going            Plan - 02/17/18 1414    Clinical Impression Statement  A: Continued with manual therapy to address fascial restrictions limiting ROM. Patient able to progress to shoulder strengthening in supine and standing this session. Patient demonstrated good form and ROM with shoulder strengthening with extra time required due to fatigue. Overhead lacing added. Minimal pain during exercises and minimal VC for form.    Plan  P: Continue with manual techniques to address fascial restrictions in upper arm. Increase reps for strengthening as tolerated. Add light wrist weight to overhead lacing and add ball on the wall exercises.     Consulted and Agree with Plan of Care  Patient       Patient will benefit from skilled therapeutic intervention in order to improve the following deficits and impairments:  Pain, Decreased scar mobility, Increased fascial restrictions, Increased muscle spasms, Decreased strength, Decreased range of motion, Impaired UE functional use  Visit Diagnosis: Other symptoms and signs involving the musculoskeletal system  Acute pain of right shoulder  Stiffness of right shoulder, not elsewhere classified    Problem List There are no active problems to display for this patient.   Roderic Palau, OT  student 02/17/2018, 2:28 PM  [place], [place], [address] Phone: [phone]   Fax:  [phone]  Name: BRIZZA NATHANSON MRN: [id number] Date of Birth: 06/27/1956

## 2018-02-17 NOTE — Therapy (Signed)
[place], [place], [address] Phone: [phone]   Fax:  [phone]  Physical Therapy Treatment/Discharge Summary  Patient Details  Name: Rachel Huber MRN: [id number] Date of Birth: Jul 11, 1956 Referring Provider: Garwin Brothers, MD   Encounter Date: 02/17/2018  PT End of Session - 02/17/18 1308    Visit Number  16    Number of Visits  17    Date for PT Re-Evaluation  02/18/18 Minireassess on 01/21/18    Authorization Type  [employer] (will update pt's personal health insurance info once obtained and include any visit limitations, etc.)    Authorization Time Period  12/24/17 to 02/18/18    PT Start Time  1308 pt arrived late    PT Stop Time  1335    PT Time Calculation (min)  27 min    Activity Tolerance  Patient tolerated treatment well    Behavior During Therapy  [facility] for tasks assessed/performed       Past Medical History:  Diagnosis Date  . Anxiety   . Arthritis   . Diabetes mellitus without complication ([place])   . Hepatitis    PAST  . Hypertension   . Neuropathy   . Sinus complaint   . Sleep apnea    CPAP  . Thalassemia minor     Past Surgical History:  Procedure Laterality Date  . ABDOMINAL HYSTERECTOMY    . BREAST BIOPSY Right 02/15/2018   [place] bx 6-6:30 ribbon shape, path pending  . BREAST BIOPSY Right 02/15/2018   [place] bx 9:00 heart shape, path pending  . CATARACT EXTRACTION W/PHACO Left 06/11/2016   Procedure: CATARACT EXTRACTION PHACO AND INTRAOCULAR LENS PLACEMENT (IOC);  Surgeon: Estill Cotta, MD;  Location: [facility] ORS;  Service: Ophthalmology;  Laterality: Left;  Lot # [id number] H US:01:38.6 AP%:26.4 CDE:44.15  . COLONOSCOPIES    . CTR      There were no vitals filed for this visit.  Subjective Assessment - 02/17/18 1308    Subjective  Pt reports that she has been feeling good today. Her other hip has been bothering her but not bad and no pain right now.    Patient Stated Goals  get  off of the walker    Currently in Pain?  No/denies    Pain Onset  More than a month ago         [facility] PT Assessment - 02/17/18 0001      Assessment   Medical Diagnosis  strengthening and gait training    Referring Provider  Garwin Brothers, MD    Onset Date/Surgical Date  09/20/17      AROM   Right Knee Extension  3 was 5      Strength   Right Hip Flexion  5/5    Right Hip Extension  4+/5    Right Hip ABduction  4+/5    Left Hip Flexion  4+/5    Left Hip Extension  4+/5    Left Hip ABduction  4+/5    Right Knee Flexion  5/5    Right Knee Extension  5/5    Right Ankle Dorsiflexion  5/5    Left Ankle Dorsiflexion  4+/5      Ambulation/Gait   Ambulation Distance (Feet)  582 Feet    Assistive device  None    Gait Pattern  Within Functional Limits      Balance   Balance Assessed  Yes      Static Standing Balance  Static Standing - Balance Support  No upper extremity supported    Static Standing Balance -  Activities   Single Leg Stance - Right Leg    Static Standing - Comment/# of Minutes  R: 12.6sec was 9.8 on R      Standardized Balance Assessment   Standardized Balance Assessment  Five Times Sit to Stand;Timed Up and Go Test      Timed Up and Go Test   TUG  Normal TUG    Normal TUG (seconds)  -- was 13.5 with no AD            PT Education - 02/17/18 1308    Education provided  Yes    Education Details  reassessment and discharge plans    Person(s) Educated  Patient    Methods  Explanation;Demonstration    Comprehension  Verbalized understanding;Returned demonstration       PT Short Term Goals - 02/17/18 1328      PT SHORT TERM GOAL #1   Title  Pt will be independent wtih HEP and perform consistently in order to decrease pain and improve overall function.    Time  4    Period  Weeks    Status  Achieved      PT SHORT TERM GOAL #2   Title  Pt will have improved R knee ROM from 3-120deg without pain at end range to maximize gait.    Baseline  7/3: 3 deg  ext today    Time  4    Period  Weeks    Status  Achieved      PT SHORT TERM GOAL #3   Title  Pt will have 1/2 grade improvement throughout BLE MMT in order to maximize gait, balance, and allow pt to complete functional tasks with greater ease.    Baseline  6/6: see MMT    Time  4    Period  Weeks    Status  Achieved      PT SHORT TERM GOAL #4   Title  Pt will be able to perform R SLS for 5 sec with no UE assist to demo improved balance and functional strengh in order to maximize gait and stair ambulation.    Baseline  6/6: 9.8 sec RLE    Time  4    Period  Weeks    Status  Achieved        PT Long Term Goals - 02/17/18 1329      PT LONG TERM GOAL #1   Title  Pt will have 1 grade improvement throughout BLE MMT in order to further maximize gait, balance, and promote return to PLOF.    Baseline  7/3: see MMT    Time  8    Period  Weeks    Status  Partially Met      PT LONG TERM GOAL #2   Title  Pt will be able to perform R SLS for 10 sec or > with no UE assist in order to further demo improved balance, functional strength and maximize overall function.    Baseline  7/3: 12 sec RLE    Time  8    Period  Weeks    Status  Achieved      PT LONG TERM GOAL #3   Title  Pt will be able to perform 5xSTS in 10 sec or < with no UE support and proper mechanics to demo improved balance and functional BLE strength.    Baseline  7/3: 10 sec    Time  8    Period  Weeks    Status  Achieved      PT LONG TERM GOAL #4   Title  Pt will be able to perform the TUG in 12 sec or < with LRAD in order to demo improved balance and decrease her risk for falls.    Baseline  7/3: 10.4 sec, no AD    Time  8    Period  Weeks    Status  Achieved      PT LONG TERM GOAL #5   Title  Pt will be able to ambulate at least 667f or > during the 3MWT with LRAD and without increases in pain to demo improved tolerance to WB and maximize her ability to return to work once cleared by MD to do so.    Baseline   7/3: 5874f no AD, no pain, gait WFL    Time  8    Period  Weeks    Status  Partially Met            Plan - 02/17/18 1337    Clinical Impression Statement  PT reassessed pt's goals and outcome measures this date. Pt has made tremendous improvements since starting therapy as illustrated above. She has met all STG and all but 2 LTG, which she partially met, this date. At this time, pt is not in need of any skilled PT intervention and she has no other concerns at this point. Strengthening HEP was updated last visit so no additions were made today. Pt discharged to HEP at this time.    Rehab Potential  Good    PT Frequency  2x / week    PT Duration  8 weeks    PT Treatment/Interventions  ADLs/Self Care Home Management;Biofeedback;Cryotherapy;Electrical Stimulation;Moist Heat;Ultrasound;DME Instruction;Gait training;Stair training;Functional mobility training;Therapeutic activities;Therapeutic exercise;Balance training;Neuromuscular re-education;Patient/family education;Orthotic Fit/Training;Manual techniques;Scar mobilization;Passive range of motion;Dry needling;Energy conservation;Taping    PT Next Visit Plan  discharged to HEP    PT Home Exercise Plan  eval: quad sets, heel slides; 6/6: sidelying hip and, clams with RTB; 6/13: TKE, mini squats, sidestep with GTB, fwd/lat step ups; 7/2: knee extension and flexion machines, 4-way hip at cable column, mini lunging, mini squats, wall squats    Consulted and Agree with Plan of Care  Patient       Patient will benefit from skilled therapeutic intervention in order to improve the following deficits and impairments:  Abnormal gait, Decreased activity tolerance, Decreased balance, Decreased endurance, Decreased mobility, Decreased range of motion, Decreased strength, Difficulty walking, Hypomobility, Increased fascial restricitons, Increased muscle spasms, Impaired flexibility, Improper body mechanics, Impaired UE functional use, Postural dysfunction,  Pain  Visit Diagnosis: Difficulty in walking, not elsewhere classified  Muscle weakness (generalized)  Chronic pain of right knee  Pain in right ankle and joints of right foot     Problem List There are no active problems to display for this patient.    PHYSICAL THERAPY DISCHARGE SUMMARY  Visits from Start of Care: 16  Current functional level related to goals / functional outcomes: See above   Remaining deficits: See above   Education / Equipment: Continue HEP  Plan: Patient agrees to discharge.  Patient goals were met. Patient is being discharged due to meeting the stated rehab goals.  ?????       BrGeraldine SolarT, DPAuburn[address]hone: [phone]  Fax:  [phone]  Name: Rachel Huber MRN: [id number] Date of Birth: 05/09/1956

## 2018-02-23 ENCOUNTER — Encounter (HOSPITAL_COMMUNITY): Payer: Self-pay

## 2018-02-23 ENCOUNTER — Ambulatory Visit (HOSPITAL_COMMUNITY): Payer: 59 | Admitting: Occupational Therapy

## 2018-02-23 ENCOUNTER — Ambulatory Visit (HOSPITAL_COMMUNITY): Payer: 59

## 2018-02-23 ENCOUNTER — Other Ambulatory Visit: Payer: Self-pay

## 2018-02-23 DIAGNOSIS — R262 Difficulty in walking, not elsewhere classified: Secondary | ICD-10-CM | POA: Diagnosis not present

## 2018-02-23 DIAGNOSIS — R29898 Other symptoms and signs involving the musculoskeletal system: Secondary | ICD-10-CM

## 2018-02-23 DIAGNOSIS — M25611 Stiffness of right shoulder, not elsewhere classified: Secondary | ICD-10-CM

## 2018-02-23 DIAGNOSIS — M25511 Pain in right shoulder: Secondary | ICD-10-CM

## 2018-02-23 NOTE — Therapy (Addendum)
[place], [place], [address] Phone: [phone]   Fax:  [phone]  Occupational Therapy Treatment  Patient Details  Name: Rachel Huber MRN: [id number] Date of Birth: 03-13-1956 Referring Provider: Dr. Frazier Richards   Encounter Date: 02/23/2018  OT End of Session - 02/23/18 1409    Visit Number  13    Number of Visits  16    Date for OT Re-Evaluation  03/08/18    Authorization Type  [employer] - following CPT codes allowed:  eval, self care, manual therapy, therapeutic exercise    Authorization - Visit Number  13    Authorization - Number of Visits  60    OT Start Time  9892    OT Stop Time  1427    OT Time Calculation (min)  40 min    Activity Tolerance  Patient tolerated treatment well    Behavior During Therapy  [facility] for tasks assessed/performed       Past Medical History:  Diagnosis Date  . Anxiety   . Arthritis   . Diabetes mellitus without complication ([place])   . Hepatitis    PAST  . Hypertension   . Neuropathy   . Sinus complaint   . Sleep apnea    CPAP  . Thalassemia minor     Past Surgical History:  Procedure Laterality Date  . ABDOMINAL HYSTERECTOMY    . BREAST BIOPSY Right 02/15/2018   [place] bx 6-6:30 ribbon shape, path pending  . BREAST BIOPSY Right 02/15/2018   [place] bx 9:00 heart shape, path pending  . CATARACT EXTRACTION W/PHACO Left 06/11/2016   Procedure: CATARACT EXTRACTION PHACO AND INTRAOCULAR LENS PLACEMENT (IOC);  Surgeon: Estill Cotta, MD;  Location: [facility] ORS;  Service: Ophthalmology;  Laterality: Left;  Lot # [id number] H US:01:38.6 AP%:26.4 CDE:44.15  . COLONOSCOPIES    . CTR      There were no vitals filed for this visit.  Subjective Assessment - 02/23/18 1403    Subjective   S: When I take down lightweight objects from the cabinet, I try to use the right arm.    Currently in Pain?  Yes    Pain Score  2     Pain Location  Shoulder    Pain Orientation  Right    Pain  Descriptors / Indicators  Aching;Sore    Pain Type  Chronic pain    Pain Radiating Towards  N/A    Pain Onset  More than a month ago    Pain Frequency  Intermittent    Aggravating Factors   certain movements    Pain Relieving Factors  rest, heat/ice, pain medications    Effect of Pain on Daily Activities  minimal effect on ADLs    Multiple Pain Sites  No         [facility] OT Assessment - 02/23/18 1405      Assessment   Medical Diagnosis  S/P Right Total Shoulder Replacement      Precautions   Precautions  None               OT Treatments/Exercises (OP) - 02/23/18 1405      Exercises   Exercises  Shoulder      Shoulder Exercises: Supine   Protraction  PROM;5 reps;Strengthening;15 reps    Protraction Weight (lbs)  1    Horizontal ABduction  PROM;5 reps;Strengthening;15 reps    Horizontal ABduction Weight (lbs)  1    External Rotation  PROM;5  reps;Strengthening;15 reps    External Rotation Weight (lbs)  1    Internal Rotation  PROM;5 reps;Strengthening;15 reps    Internal Rotation Weight (lbs)  1    Flexion  PROM;5 reps;Strengthening;15 reps    Shoulder Flexion Weight (lbs)  1    ABduction  PROM;5 reps;Strengthening;15 reps    Shoulder ABduction Weight (lbs)  1      Shoulder Exercises: Seated   Protraction  Strengthening;10 reps    Protraction Weight (lbs)  1    Horizontal ABduction  Strengthening;10 reps    Horizontal ABduction Weight (lbs)  1    External Rotation  Strengthening;10 reps    External Rotation Weight (lbs)  1    Internal Rotation  Strengthening;10 reps    Internal Rotation Weight (lbs)  1    Flexion  Strengthening;10 reps    Flexion Weight (lbs)  1    Abduction  Strengthening;10 reps    ABduction Weight (lbs)  1      Shoulder Exercises: ROM/Strengthening   Over Head Lace  1' with 1/2# wrist weight    Caudal Glide  15X; 1# no rest breaks    Proximal Shoulder Strengthening, Supine  10X; 1# no rest breaks    Ball on Wall  1' flexion and 1'  abduction      Manual Therapy   Manual Therapy  Myofascial release    Manual therapy comments  completed seperately from all other skilled interventions             OT Education - 02/23/18 1408    Education provided  No       OT Short Term Goals - 02/16/18 1021      OT SHORT TERM GOAL #1   Title  Patient will be educated on HEP for improved RUE functional use.    Time  4    Period  Weeks    Status  On-going      OT SHORT TERM GOAL #2   Title  Patient will improve right shoulder P/ROM to [facility] in order to improve ease of donning and doffing clothing.     Time  4    Period  Weeks      OT SHORT TERM GOAL #3   Title  Patient will increase right shoulder strength to 4/5 in order to improve ability to lift bags of groceries.     Time  4    Period  Weeks    Status  Partially Met      OT SHORT TERM GOAL #4   Title  Patient will decrease fascial and scar restrictions from moderate to min moderate for improved mobility needed for increased participation in work tasks.     Time  4    Period  Weeks    Status  On-going      OT SHORT TERM GOAL #5   Title  Patient will decrease pain to 3/10 in right arm and shoulder during ADL completion.     Time  4    Period  Weeks        OT Long Term Goals - 01/14/18 1005      OT LONG TERM GOAL #1   Title  Patient will use her right arm as dominant with all B/IADLs, work, leisure, and driving tasks.     Time  8    Period  Weeks    Status  On-going      OT LONG TERM GOAL #2   Title  Patient will increase right shoulder A/ROM to [facility] in order to reach behind back to fasten bra and reach overhead with out difficulty.     Time  8    Period  Weeks    Status  On-going      OT LONG TERM GOAL #3   Title  Patient will increase right shoulder strength to 5/5 in order to lift items at work and when completing leisure activities.     Time  8    Period  Weeks    Status  On-going      OT LONG TERM GOAL #4   Title  Patient will decrease  pain to 2/10 or less in her right shoulder when reaching overhead.    Time  8    Period  Weeks    Status  On-going      OT LONG TERM GOAL #5   Title  Patient will decrese fascial restrictions to minimal in order to improve moblity needed to complete daily activities without pain.     Time  8    Period  Weeks    Status  On-going            Plan - 02/23/18 1411    Clinical Impression Statement  A: Continued with manual therapy to address fascial restrictions limiting ROM. Moderate fascial restrictions palpated in lateral and anterior upper arm region. Patient able to increase reps for supine strengthening. Ball on the wall and overhead lacing with wrist weight exercises added.  Patient demonstrated good form and ROM with shoulder strengthening with extra time required due to fatigue. Minimal VC for form.    Plan  P: Continue with manual techniques to address fascial restrictions in upper arm. Attempt sidelying strengthening and discharge supine strengthening exercises. Keep reps at 10. Add therapy ball exercises.    Consulted and Agree with Plan of Care  Patient       Patient will benefit from skilled therapeutic intervention in order to improve the following deficits and impairments:  Pain, Decreased scar mobility, Increased fascial restrictions, Increased muscle spasms, Decreased strength, Decreased range of motion, Impaired UE functional use  Visit Diagnosis: Other symptoms and signs involving the musculoskeletal system  Acute pain of right shoulder  Stiffness of right shoulder, not elsewhere classified    Problem List There are no active problems to display for this patient.   Roderic Palau, OT student 02/23/2018, 2:27 PM  [place] [address] [place], [place], [address] Phone: [phone]   Fax:  [phone]  Name: Rachel Huber MRN: [id number] Date of Birth: 1956-03-25

## 2018-02-24 ENCOUNTER — Encounter (HOSPITAL_COMMUNITY): Payer: Self-pay

## 2018-02-24 ENCOUNTER — Other Ambulatory Visit: Payer: Self-pay

## 2018-02-24 ENCOUNTER — Ambulatory Visit (HOSPITAL_COMMUNITY): Payer: 59

## 2018-02-24 DIAGNOSIS — M25611 Stiffness of right shoulder, not elsewhere classified: Secondary | ICD-10-CM

## 2018-02-24 DIAGNOSIS — R29898 Other symptoms and signs involving the musculoskeletal system: Secondary | ICD-10-CM

## 2018-02-24 DIAGNOSIS — R262 Difficulty in walking, not elsewhere classified: Secondary | ICD-10-CM | POA: Diagnosis not present

## 2018-02-24 DIAGNOSIS — M25511 Pain in right shoulder: Secondary | ICD-10-CM

## 2018-02-24 NOTE — Therapy (Addendum)
[place], [place], [address] Phone: [phone]   Fax:  [phone]  Occupational Therapy Treatment  Patient Details  Name: Rachel Huber MRN: [id number] Date of Birth: February 05, 1956 Referring Provider: Dr. Frazier Richards   Encounter Date: 02/24/2018  OT End of Session - 02/24/18 1457    Visit Number  14    Number of Visits  16    Date for OT Re-Evaluation  03/08/18    Authorization Type  [employer] - following CPT codes allowed:  eval, self care, manual therapy, therapeutic exercise    Authorization - Visit Number  14    Authorization - Number of Visits  60    OT Start Time  1435    OT Stop Time  1514    OT Time Calculation (min)  39 min    Activity Tolerance  Patient tolerated treatment well    Behavior During Therapy  [facility] for tasks assessed/performed       Past Medical History:  Diagnosis Date  . Anxiety   . Arthritis   . Diabetes mellitus without complication ([place])   . Hepatitis    PAST  . Hypertension   . Neuropathy   . Sinus complaint   . Sleep apnea    CPAP  . Thalassemia minor     Past Surgical History:  Procedure Laterality Date  . ABDOMINAL HYSTERECTOMY    . BREAST BIOPSY Right 02/15/2018   [place] bx 6-6:30 ribbon shape, path pending  . BREAST BIOPSY Right 02/15/2018   [place] bx 9:00 heart shape, path pending  . CATARACT EXTRACTION W/PHACO Left 06/11/2016   Procedure: CATARACT EXTRACTION PHACO AND INTRAOCULAR LENS PLACEMENT (IOC);  Surgeon: Estill Cotta, MD;  Location: [facility] ORS;  Service: Ophthalmology;  Laterality: Left;  Lot # [id number] H US:01:38.6 AP%:26.4 CDE:44.15  . COLONOSCOPIES    . CTR      There were no vitals filed for this visit.  Subjective Assessment - 02/24/18 1436    Subjective   S: It is a little achy from yesterday's treatment but otherwise not bad.    Currently in Pain?  Yes    Pain Score  2     Pain Location  Shoulder    Pain Orientation  Right    Pain Descriptors  / Indicators  Aching;Sore    Pain Type  Chronic pain    Pain Radiating Towards  N/A    Pain Onset  More than a month ago    Pain Frequency  Intermittent    Aggravating Factors   certain movements    Pain Relieving Factors  rest, heat/ice, pain medications    Effect of Pain on Daily Activities  minimal effect on ADLs    Multiple Pain Sites  No         [facility] OT Assessment - 02/24/18 1435      Assessment   Medical Diagnosis  S/P Right Total Shoulder Replacement      Precautions   Precautions  None               OT Treatments/Exercises (OP) - 02/24/18 1435      Exercises   Exercises  Shoulder      Shoulder Exercises: Supine   Protraction  PROM;5 reps    Horizontal ABduction  PROM;5 reps    External Rotation  PROM;5 reps    Internal Rotation  PROM;5 reps    Flexion  PROM;5 reps    ABduction  PROM;5 reps  Shoulder Exercises: Seated   Protraction  Strengthening;10 reps    Protraction Weight (lbs)  1    Horizontal ABduction  Strengthening;10 reps    Horizontal ABduction Weight (lbs)  1    External Rotation  Strengthening;10 reps    External Rotation Weight (lbs)  1    Internal Rotation  Strengthening;10 reps    Internal Rotation Weight (lbs)  1    Flexion  Strengthening;10 reps    Flexion Weight (lbs)  1    Abduction  Strengthening;10 reps    ABduction Weight (lbs)  1      Shoulder Exercises: Sidelying   External Rotation  Strengthening;10 reps    External Rotation Weight (lbs)  1    Internal Rotation  Strengthening;10 reps    Internal Rotation Weight (lbs)  1    Flexion  Strengthening;10 reps    Flexion Weight (lbs)  1    ABduction  Strengthening;10 reps    ABduction Weight (lbs)  1    Other Sidelying Exercises  protraction; strengthening; 10X; 1#    Other Sidelying Exercises  horizontal abduction; strengthening; 10X; 1#      Shoulder Exercises: Therapy Ball   Flexion  Both;10 reps    ABduction  Both;10 reps    Other Therapy Ball Exercises   horizontal abduction, chest press, overhead press, diagonals, and circles in each direction; 10 reps      Shoulder Exercises: ROM/Strengthening   X to V Arms  10X; 1#    Proximal Shoulder Strengthening, Seated  10X; 1# no rest breaks      Manual Therapy   Manual Therapy  Myofascial release    Manual therapy comments  completed seperately from all other skilled interventions    Myofascial Release  myofascial release and manual stretching to right upper arm, scapular, shoulder region  and scar region to decrease fascial restrictions and improve pain free mobility in right arm and shoulder             OT Education - 02/24/18 1457    Education provided  No       OT Short Term Goals - 02/16/18 1021      OT SHORT TERM GOAL #1   Title  Patient will be educated on HEP for improved RUE functional use.    Time  4    Period  Weeks    Status  On-going      OT SHORT TERM GOAL #2   Title  Patient will improve right shoulder P/ROM to [facility] in order to improve ease of donning and doffing clothing.     Time  4    Period  Weeks      OT SHORT TERM GOAL #3   Title  Patient will increase right shoulder strength to 4/5 in order to improve ability to lift bags of groceries.     Time  4    Period  Weeks    Status  Partially Met      OT SHORT TERM GOAL #4   Title  Patient will decrease fascial and scar restrictions from moderate to min moderate for improved mobility needed for increased participation in work tasks.     Time  4    Period  Weeks    Status  On-going      OT SHORT TERM GOAL #5   Title  Patient will decrease pain to 3/10 in right arm and shoulder during ADL completion.     Time  4  Period  Weeks        OT Long Term Goals - 01/14/18 1005      OT LONG TERM GOAL #1   Title  Patient will use her right arm as dominant with all B/IADLs, work, leisure, and driving tasks.     Time  8    Period  Weeks    Status  On-going      OT LONG TERM GOAL #2   Title  Patient will  increase right shoulder A/ROM to [facility] in order to reach behind back to fasten bra and reach overhead with out difficulty.     Time  8    Period  Weeks    Status  On-going      OT LONG TERM GOAL #3   Title  Patient will increase right shoulder strength to 5/5 in order to lift items at work and when completing leisure activities.     Time  8    Period  Weeks    Status  On-going      OT LONG TERM GOAL #4   Title  Patient will decrease pain to 2/10 or less in her right shoulder when reaching overhead.    Time  8    Period  Weeks    Status  On-going      OT LONG TERM GOAL #5   Title  Patient will decrese fascial restrictions to minimal in order to improve moblity needed to complete daily activities without pain.     Time  8    Period  Weeks    Status  On-going            Plan - 02/24/18 1500    Clinical Impression Statement  A: Continued with manual therapy to address fascial restrictions limiting ROM. Moderate fascial restrictions palpated in lateral and anterior upper arm region. Patient able to progress to strengthening in sidelying this session. Therapy ball exercises added.  Patient demonstrated good form and ROM with shoulder strengthening with extra time and occasional rest breaks required due to fatigue. Minimal VC for form.    Plan  P: Continue with manual techniques to address fascial restrictions in upper arm. Increase reps for strengthening exercises as tolerated. Add wall walks with loop theraband and add to HEP. Add UBE for strengthening.    Consulted and Agree with Plan of Care  Patient       Patient will benefit from skilled therapeutic intervention in order to improve the following deficits and impairments:  Pain, Decreased scar mobility, Increased fascial restrictions, Increased muscle spasms, Decreased strength, Decreased range of motion, Impaired UE functional use  Visit Diagnosis: Other symptoms and signs involving the musculoskeletal system  Acute pain of  right shoulder  Stiffness of right shoulder, not elsewhere classified    Problem List There are no active problems to display for this patient.   Roderic Palau, OT student 02/24/2018, 3:14 PM  [place], [place], [address] Phone: [phone]   Fax:  [phone]  Name: Rachel Huber MRN: [id number] Date of Birth: March 24, 1956

## 2018-03-02 ENCOUNTER — Encounter (HOSPITAL_COMMUNITY): Payer: Self-pay

## 2018-03-02 ENCOUNTER — Ambulatory Visit (HOSPITAL_COMMUNITY): Payer: 59

## 2018-03-02 ENCOUNTER — Other Ambulatory Visit: Payer: Self-pay

## 2018-03-02 DIAGNOSIS — R262 Difficulty in walking, not elsewhere classified: Secondary | ICD-10-CM | POA: Diagnosis not present

## 2018-03-02 DIAGNOSIS — M25511 Pain in right shoulder: Secondary | ICD-10-CM

## 2018-03-02 DIAGNOSIS — R29898 Other symptoms and signs involving the musculoskeletal system: Secondary | ICD-10-CM

## 2018-03-02 DIAGNOSIS — M25611 Stiffness of right shoulder, not elsewhere classified: Secondary | ICD-10-CM

## 2018-03-02 NOTE — Therapy (Addendum)
[place], [place], [address] Phone: [phone]   Fax:  [phone]  Occupational Therapy Treatment  Patient Details  Name: Rachel Huber MRN: [id number] Date of Birth: [birth year] Referring Provider: Dr. Frazier Richards   Encounter Date: 03/02/2018  OT End of Session - 03/02/18 1151    Visit Number  15    Number of Visits  16    Date for OT Re-Evaluation  03/08/18    Authorization Type  [employer] - following CPT codes allowed:  eval, self care, manual therapy, therapeutic exercise    Authorization - Visit Number  15    Authorization - Number of Visits  61    OT Start Time  1132 Patient arrived late    OT Stop Time  1202    OT Time Calculation (min)  30 min    Activity Tolerance  Patient tolerated treatment well    Behavior During Therapy  [facility] for tasks assessed/performed       Past Medical History:  Diagnosis Date  . Anxiety   . Arthritis   . Diabetes mellitus without complication ([place])   . Hepatitis    PAST  . Hypertension   . Neuropathy   . Sinus complaint   . Sleep apnea    CPAP  . Thalassemia minor     Past Surgical History:  Procedure Laterality Date  . ABDOMINAL HYSTERECTOMY    . BREAST BIOPSY Right 02/15/2018   [place] bx 6-6:30 ribbon shape, path pending  . BREAST BIOPSY Right 02/15/2018   [place] bx 9:00 heart shape, path pending  . CATARACT EXTRACTION W/PHACO Left 06/11/2016   Procedure: CATARACT EXTRACTION PHACO AND INTRAOCULAR LENS PLACEMENT (IOC);  Surgeon: Estill Cotta, MD;  Location: [facility] ORS;  Service: Ophthalmology;  Laterality: Left;  Lot # [id number] H US:01:38.6 AP%:26.4 CDE:44.15  . COLONOSCOPIES    . CTR      There were no vitals filed for this visit.  Subjective Assessment - 03/02/18 1146    Subjective   S: Today it is feeling a little more difficult to get through the exercises.    Currently in Pain?  Yes    Pain Score  1     Pain Location  Shoulder    Pain Orientation   Right    Pain Descriptors / Indicators  Aching;Sore    Pain Type  Chronic pain    Pain Radiating Towards  N/A    Pain Onset  More than a month ago    Pain Frequency  Intermittent    Aggravating Factors   certain movements    Pain Relieving Factors  rest, heat/ice, pain medications    Effect of Pain on Daily Activities  minimal effect on ADLs    Multiple Pain Sites  No         [facility] OT Assessment - 03/02/18 1134      Assessment   Medical Diagnosis  S/P Right Total Shoulder Replacement      Precautions   Precautions  None               OT Treatments/Exercises (OP) - 03/02/18 1135      Exercises   Exercises  Shoulder      Shoulder Exercises: Supine   Protraction  PROM;5 reps    Horizontal ABduction  PROM;5 reps    External Rotation  PROM;5 reps    Internal Rotation  PROM;5 reps    Flexion  PROM;5 reps  ABduction  PROM;5 reps      Shoulder Exercises: Sidelying   External Rotation  Strengthening;15 reps    External Rotation Weight (lbs)  1    Internal Rotation  Strengthening;15 reps    Internal Rotation Weight (lbs)  1    Flexion  Strengthening;15 reps    Flexion Weight (lbs)  1    ABduction  Strengthening;15 reps    ABduction Weight (lbs)  1    Other Sidelying Exercises  protraction; strengthening; 15X; 1#    Other Sidelying Exercises  horizontal abduction; strengthening; 15X; 1#      Shoulder Exercises: ROM/Strengthening   UBE (Upper Arm Bike)  Level 2 2' foward 2' reverse    Other ROM/Strengthening Exercises  wall walks with red band; 10X horizontal and diagonals             OT Education - 03/02/18 1156    Education provided  Yes    Education Details  Patient given red loop theraband and instructed to add wall walks to HEP.    Person(s) Educated  Patient    Methods  Explanation;Demonstration;Verbal cues    Comprehension  Verbalized understanding;Returned demonstration       OT Short Term Goals - 02/16/18 1021      OT SHORT TERM GOAL #1    Title  Patient will be educated on HEP for improved RUE functional use.    Time  4    Period  Weeks    Status  On-going      OT SHORT TERM GOAL #2   Title  Patient will improve right shoulder P/ROM to [facility] in order to improve ease of donning and doffing clothing.     Time  4    Period  Weeks      OT SHORT TERM GOAL #3   Title  Patient will increase right shoulder strength to 4/5 in order to improve ability to lift bags of groceries.     Time  4    Period  Weeks    Status  Partially Met      OT SHORT TERM GOAL #4   Title  Patient will decrease fascial and scar restrictions from moderate to min moderate for improved mobility needed for increased participation in work tasks.     Time  4    Period  Weeks    Status  On-going      OT SHORT TERM GOAL #5   Title  Patient will decrease pain to 3/10 in right arm and shoulder during ADL completion.     Time  4    Period  Weeks        OT Long Term Goals - 01/14/18 1005      OT LONG TERM GOAL #1   Title  Patient will use her right arm as dominant with all B/IADLs, work, leisure, and driving tasks.     Time  8    Period  Weeks    Status  On-going      OT LONG TERM GOAL #2   Title  Patient will increase right shoulder A/ROM to [facility] in order to reach behind back to fasten bra and reach overhead with out difficulty.     Time  8    Period  Weeks    Status  On-going      OT LONG TERM GOAL #3   Title  Patient will increase right shoulder strength to 5/5 in order to lift items at work and when  completing leisure activities.     Time  8    Period  Weeks    Status  On-going      OT LONG TERM GOAL #4   Title  Patient will decrease pain to 2/10 or less in her right shoulder when reaching overhead.    Time  8    Period  Weeks    Status  On-going      OT LONG TERM GOAL #5   Title  Patient will decrese fascial restrictions to minimal in order to improve moblity needed to complete daily activities without pain.     Time  8    Period   Weeks    Status  On-going            Plan - 03/02/18 1201    Clinical Impression Statement  A: No manual performed this session as patient was approximately 15 minutes late. Patient able to increase reps for sidelying strengthening. Wall walks with red loop theraband added and patient instructed to complete as part of HEP. Minimal VC for form.    Plan  P: Complete reassessment as patient reports she will have to reschedule some appointments next week due to upcoming unrelated surgery. Resume with manual techniques to address fascial restrictions in upper arm. Increase weight for sidelying strengthening and reps for standing strengthening as tolerated.     Consulted and Agree with Plan of Care  Patient       Patient will benefit from skilled therapeutic intervention in order to improve the following deficits and impairments:  Pain, Decreased scar mobility, Increased fascial restrictions, Increased muscle spasms, Decreased strength, Decreased range of motion, Impaired UE functional use  Visit Diagnosis: Other symptoms and signs involving the musculoskeletal system  Acute pain of right shoulder  Stiffness of right shoulder, not elsewhere classified    Problem List There are no active problems to display for this patient.   Roderic Palau, OT student 03/02/2018, 12:07 PM  [place] [address] [place], [place], [address] Phone: [phone]   Fax:  [phone]  Name: NATIVIDAD SCHLOSSER MRN: [id number] Date of Birth: 02-24-1956

## 2018-03-02 NOTE — H&P (Signed)
Progress Notes by Benjamine Sprague, DO at 03/01/2018 11:00 AM   Author: Benjamine Sprague, DO Service: - Author Type: Physician  Filed: 03/01/2018 12:01 PM Encounter Date: 03/01/2018 Note Type: Progress Notes  Editor: Benjamine Sprague, DO (Physician)     Show:Clear all [x] Manual[x] Template[] Copied  Added by: [x] Gavyn Ybarra, DO  [] Hover for details Subjective:   CC: Mass of multiple sites of right breast [N63.10] HPI:  Rachel Huber is a 62 y.o. female who returns today to discuss risks/benefits/alternatives to excisional biopsy of right breast mass.  She is with her sister.  No changes since last exam, including ROS.    Past Medical History:  has a past medical history of Allergic state, Arthritis, Bilateral breast cysts, Cancer (CMS-HCC), DM2 (diabetes mellitus, type 2) (CMS-HCC), Hepatitis, History of chickenpox, HTN (hypertension), Obesity, unspecified, and Thalassemia minor.  Past Surgical History:  has a past surgical history that includes Carpal tunnel release, right (2003); Colonoscopy (01/22/2016); and Hysterectomy.  Family History: family history includes Asthma in her other; Breast cancer (age of onset: 60) in her mother; Breast cancer (age of onset: 72) in her sister; Diabetes type II in her father; Epilepsy in her other; High blood pressure (Hypertension) in her mother; Prostate cancer in her father; Rheum arthritis in her other.  Current Medications: has a current medication list which includes the following prescription(s): acetaminophen, atorvastatin, gabapentin, glimepiride, hydrocodone-acetaminophen, lisinopril-hydrochlorothiazide, metformin, multivitamin, sertraline, and vitamin e.  Allergies:       Allergies as of 03/01/2018 - Reviewed 03/01/2018  Allergen Reaction Noted  . Latex Unknown 09/02/2016    ROS:  A 15 point review of systems was performed and was negative except as noted in HPI   Objective:   BP 139/70   Pulse 97   Temp 36.6 C (97.8 F) (Oral)    Ht 180.3 cm (5' 11" )   Wt (!) 115.2 kg (254 lb)   BMI 35.43 kg/m   Constitutional :  alert, appears stated age, cooperative and no distress  Musculoskeletal: Steady gait and movement  Psychiatric: Normal affect, non-agitated, not confused    Breast:  left breast normal without mass, skin or nipple changes or axillary nodes, abnormal mass palpable at 9 oclock and 6oclock position on right with evidence of bruising from previous core needle biopsy.  No obvious nipple discharge or axillary lymphadenopathy on right     LABS: n/a RADS:  CLINICAL DATA:Status post ultrasound-guided biopsies of 2 masses  within the RIGHT breast.    EXAM:  DIAGNOSTIC RIGHT MAMMOGRAM POST ULTRASOUND BIOPSY    COMPARISON:Previous exam(s).    FINDINGS:  Mammographic images were obtained following ultrasound guided biopsy  of RIGHT breast masses, 6 o'clock axis and 9 o'clock axis. Ribbon  shaped clip is appropriately positioned at the 6 to 6:30 o'clock  axis. The heart shaped clip is appropriately positioned at the 9  o'clock axis.    IMPRESSION:  1. Ribbon shaped clip is appropriately positioned at the 6 to 6:30  o'clock axis, corresponding to the site of the targeted mass.  2. Heart shaped clip is appropriately positioned at the 9 o'clock  axis, corresponding to the site of the second targeted mass.    Final Assessment: Post Procedure Mammograms for Marker Placement    Electronically Signed:  By01/29/2018 M.D.  On: 02/15/2018 09:39    [facility]    ADDENDUM:  Pathology of the right breast biopsy, 6:00 o'clock position revealed  A. BREAST, RIGHT, 6 O'CLOCK; ULTRASOUND-GUIDED CORE BIOPSY: ONE CORE  FRAGMENT WITH FIBROSIS. ONE CORE FRAGMENT WITH PORTION OF A DILATED  DUCT. NEGATIVE FOR ATYPIA AND MALIGNANCY.    Pathology of the right breast biopsy, 9:00 o'clock position revealed  B. BREAST, RIGHT, 9 O'CLOCK; ULTRASOUND-GUIDED CORE BIOPSY:  FIBROTIC  BREAST TISSUE WITH USUAL DUCTAL HYPERPLASIA. 0.5 MM FRAGMENT WITH  MACROPHAGES. NEGATIVE FOR ATYPIA AND MALIGNANCY. Comment: Additional  deeper sections were reviewed. No fat necrosis is identified in  these samples, and there are no changes which would specifically  account for identifiable lesions. Clinical and radiologic  correlation is advised to be sure the samples represent the areas of  concern.    Both biopsies found to be discordant by Dr. Enriqueta Shutter.    Recommendation: Surgical excision. Also consider skin punch biopsy  per recommendation on diagnostic mammogram report of 01/08/18.     Assessment:   Mass of multiple sites of right breast [N63.10]  Family hx of breast CA  Plan:   1. Mass of multiple sites of right breast [N63.10]  Discussed the risk of surgery including recurrence, chronic pain, post-op infxn, poor/delayed wound healing, poor cosmesis, seroma, hematoma formation, and possible re-operation to address said risks. Typical post-op recovery time and possbility of activity restrictions were also discussed.  Alternatives include continued observation.  Benefits include possible symptom relief, pathologic evaluation, and/or curative excision.   The patient and sister verbalized understanding and all questions were answered to the patient's satisfaction.  2. Patient has elected to proceed with surgical treatment. Procedure will be scheduled.  Written consent was obtained.  3. Family hx of Breast CA.  Will refer to geneticist for possible genetic screening due to sister and mother's positive hx of breast CA as noted above.  Neither have been previously tested.  Of the 25 minutes I spent with the patient, 20 minutes were spent discussing pathophysiology, risks, benefits, alternatives to the treatment options

## 2018-03-03 ENCOUNTER — Encounter (HOSPITAL_COMMUNITY): Payer: Self-pay | Admitting: Occupational Therapy

## 2018-03-03 ENCOUNTER — Ambulatory Visit (HOSPITAL_COMMUNITY): Payer: 59 | Admitting: Occupational Therapy

## 2018-03-03 DIAGNOSIS — M25511 Pain in right shoulder: Secondary | ICD-10-CM

## 2018-03-03 DIAGNOSIS — R262 Difficulty in walking, not elsewhere classified: Secondary | ICD-10-CM | POA: Diagnosis not present

## 2018-03-03 DIAGNOSIS — M25611 Stiffness of right shoulder, not elsewhere classified: Secondary | ICD-10-CM

## 2018-03-03 DIAGNOSIS — R29898 Other symptoms and signs involving the musculoskeletal system: Secondary | ICD-10-CM

## 2018-03-03 NOTE — Patient Instructions (Signed)
  1) Flexion Wall Stretch    Face wall, place affected handon wall in front of you. Slide hand up the wall  and lean body in towards the wall. Hold for 10 seconds. Repeat 3-5 times. 1-2 times/day.     2) Towel Stretch with Internal Rotation   Or     Gently pull up (or to the side) your affected arm  behind your back with the assist of a towel. Hold 10 seconds, repeat 3-5 times. 1-2 times/day.             3) Corner Stretch    Stand at a corner of a wall, place your arms on the walls with elbows bent. Lean into the corner until a stretch is felt along the front of your chest and/or shoulders. Hold for 10 seconds. Repeat 3-5X, 1-2 times/day.    4) Posterior Capsule Stretch    Bring the involved arm across chest. Grasp elbow and pull toward chest until you feel a stretch in the back of the upper arm and shoulder. Hold 10 seconds. Repeat 3-5X. Complete 1-2 times/day.     5) External Rotation Stretch:     Place your affected hand on the wall with the elbow bent and gently turn your body the opposite direction until a stretch is felt. Hold 10 seconds, repeat 3-5X. Complete 1-2 times/day.

## 2018-03-03 NOTE — Therapy (Addendum)
[place], [place], [address] Phone: [phone]   Fax:  [phone]  Occupational Therapy Reassessment, Treatment (Recertification)  Patient Details  Name: Rachel Huber MRN: [id number] Date of Birth: 1956/04/17 Referring Provider: Dr. Frazier Richards   Progress Note Reporting Period  02/10/2018 to 03/03/2018  See note below for Objective Data and Assessment of Progress/Goals.       Encounter Date: 03/03/2018  OT End of Session - 03/03/18 1438    Visit Number  16    Number of Visits  24    Date for OT Re-Evaluation  04/02/18    Authorization Type  [employer] - following CPT codes allowed:  eval, self care, manual therapy, therapeutic exercise    Authorization - Visit Number  16    Authorization - Number of Visits  60    OT Start Time  1025    OT Stop Time  1430    OT Time Calculation (min)  41 min    Activity Tolerance  Patient tolerated treatment well    Behavior During Therapy  WFL for tasks assessed/performed       Past Medical History:  Diagnosis Date  . Anxiety   . Arthritis   . Diabetes mellitus without complication ([place])   . Hepatitis    PAST  . Hypertension   . Neuropathy   . Sinus complaint   . Sleep apnea    CPAP  . Thalassemia minor     Past Surgical History:  Procedure Laterality Date  . ABDOMINAL HYSTERECTOMY    . BREAST BIOPSY Right 02/15/2018   [place] bx 6-6:30 ribbon shape, path pending  . BREAST BIOPSY Right 02/15/2018   [place] bx 9:00 heart shape, path pending  . CATARACT EXTRACTION W/PHACO Left 06/11/2016   Procedure: CATARACT EXTRACTION PHACO AND INTRAOCULAR LENS PLACEMENT (IOC);  Surgeon: Estill Cotta, MD;  Location: [facility] ORS;  Service: Ophthalmology;  Laterality: Left;  Lot # [id number] H US:01:38.6 AP%:26.4 CDE:44.15  . COLONOSCOPIES    . CTR      There were no vitals filed for this visit.  Subjective Assessment - 03/03/18 1352    Subjective   S: I can carry a gallon  of milk but I can't raise it up.     Currently in Pain?  No/denies         [facility] OT Assessment - 03/03/18 1352      Assessment   Medical Diagnosis  S/P Right Total Shoulder Replacement      Precautions   Precautions  None      Observation/Other Assessments   Focus on Therapeutic Outcomes (FOTO)   56/100      AROM   Overall AROM Comments  assessed in seated, external and internal rotation with shoulder adducted     AROM Assessment Site  Shoulder    Right/Left Shoulder  Right    Right Shoulder Flexion  135 Degrees 130 previous    Right Shoulder ABduction  135 Degrees 128 previous    Right Shoulder Internal Rotation  90 Degrees same as previous    Right Shoulder External Rotation  46 Degrees 45 previous      PROM   Overall PROM Comments  assessed in supine, external rotation and internal rotation with shoulder adducted    PROM Assessment Site  Shoulder    Right/Left Shoulder  Right    Right Shoulder Flexion  152 Degrees 145 previous    Right Shoulder ABduction  168 Degrees 165  previous    Right Shoulder Internal Rotation  90 Degrees same as previous    Right Shoulder External Rotation  75 Degrees 70 previous      Strength   Overall Strength Comments  assessed in seated position, external and internal rotation with shoulder adducted     Strength Assessment Site  Shoulder    Right/Left Shoulder  Right    Right Shoulder Flexion  4+/5 4/5 previous    Right Shoulder ABduction  4/5 same as previous    Right Shoulder Internal Rotation  4/5 same as previous    Right Shoulder External Rotation  3+/5 same as previous    Right Shoulder Horizontal ABduction  -- 3+/5 previous               OT Treatments/Exercises (OP) - 03/03/18 1409      Exercises   Exercises  Shoulder      Shoulder Exercises: Supine   Protraction  PROM;5 reps    Horizontal ABduction  PROM;5 reps    External Rotation  PROM;5 reps    Internal Rotation  PROM;5 reps    Flexion  PROM;5 reps    ABduction   PROM;5 reps      Shoulder Exercises: Seated   Protraction  Strengthening;10 reps    Protraction Weight (lbs)  1    Horizontal ABduction  Strengthening;12 reps    Horizontal ABduction Weight (lbs)  1    External Rotation  Strengthening;12 reps    External Rotation Weight (lbs)  1    Internal Rotation  Strengthening;12 reps    Internal Rotation Weight (lbs)  1    Flexion  Strengthening;12 reps    Flexion Weight (lbs)  1    Abduction  Strengthening;12 reps    ABduction Weight (lbs)  1      Shoulder Exercises: ROM/Strengthening   X to V Arms  10X; 1#    Caudal Glide  15X; 1# 1 rest break    Ball on Wall  1' flexion and 1' abduction      Shoulder Exercises: Stretch   Corner Stretch  3 reps;10 seconds    Cross Chest Stretch  3 reps;10 seconds    Internal Rotation Stretch  3 reps 10" with horizontal towel    External Rotation Stretch  3 reps;10 seconds    Wall Stretch - Flexion  3 reps;10 seconds             OT Education - 03/03/18 1416    Education provided  Yes    Education Details  shoulder stretches    Person(s) Educated  Patient    Methods  Explanation;Demonstration;Handout    Comprehension  Verbalized understanding;Returned demonstration       OT Short Term Goals - 03/03/18 1439      OT SHORT TERM GOAL #1   Title  Patient will be educated on HEP for improved RUE functional use.    Time  4    Period  Weeks    Status  On-going      OT SHORT TERM GOAL #2   Title  Patient will improve right shoulder P/ROM to [facility] in order to improve ease of donning and doffing clothing.     Time  4    Period  Weeks      OT SHORT TERM GOAL #3   Title  Patient will increase right shoulder strength to 4/5 in order to improve ability to lift bags of groceries.     Time  4    Period  Weeks    Status  Partially Met      OT SHORT TERM GOAL #4   Title  Patient will decrease fascial and scar restrictions from moderate to min moderate for improved mobility needed for increased  participation in work tasks.     Time  4    Period  Weeks    Status  Achieved      OT SHORT TERM GOAL #5   Title  Patient will decrease pain to 3/10 in right arm and shoulder during ADL completion.     Time  4    Period  Weeks        OT Long Term Goals - 03/03/18 1439      OT LONG TERM GOAL #1   Title  Patient will use her right arm as dominant with all B/IADLs, work, leisure, and driving tasks.     Time  8    Period  Weeks    Status  On-going      OT LONG TERM GOAL #2   Title  Patient will increase right shoulder A/ROM to [facility] in order to reach behind back to fasten bra and reach overhead with out difficulty.     Time  8    Period  Weeks    Status  Achieved      OT LONG TERM GOAL #3   Title  Patient will increase right shoulder strength to 5/5 in order to lift items at work and when completing leisure activities.     Time  8    Period  Weeks    Status  On-going      OT LONG TERM GOAL #4   Title  Patient will decrease pain to 2/10 or less in her right shoulder when reaching overhead.    Time  8    Period  Weeks    Status  Achieved      OT LONG TERM GOAL #5   Title  Patient will decrese fascial restrictions to minimal in order to improve moblity needed to complete daily activities without pain.     Time  8    Period  Weeks    Status  On-going            Plan - 03/03/18 1439    Clinical Impression Statement  A: Reassessment completed this session, pt has met 3/5 STGs with an additional goal partially met, and 2/5 LTGs making improvements in ROM, pain, and functional use of RUE. Pt continues to have decreased strength in RUE required for functional tasks. Discussed progress and remaining goals with pt, pt is agreeable with continuing therapy focusing on improving strength required for ADL completion.  Continued with strengthening this session, added shoulder stretches. Verbal cuing for form and technique.     Plan  P: Continue with skilled OT services 2x/week for 4  weeks working to improve strength required for use of RUE as dominant during ADLs       Patient will benefit from skilled therapeutic intervention in order to improve the following deficits and impairments:  Pain, Decreased scar mobility, Increased fascial restrictions, Increased muscle spasms, Decreased strength, Decreased range of motion, Impaired UE functional use  Visit Diagnosis: Other symptoms and signs involving the musculoskeletal system  Acute pain of right shoulder  Stiffness of right shoulder, not elsewhere classified    Problem List There are no active problems to display for this patient.  Guadelupe Sabin, OTR/L  [phone]  03/03/2018, 2:57 PM  [place], [place], [address] Phone: [phone]   Fax:  [phone]  Name: DOLL FRAZEE MRN: [id number] Date of Birth: 10-08-1955

## 2018-03-04 ENCOUNTER — Encounter
Admission: RE | Admit: 2018-03-04 | Discharge: 2018-03-04 | Disposition: A | Discharge status: Home / Self Care | Payer: 59 | Source: Ambulatory Visit | Attending: Surgery | Admitting: Surgery

## 2018-03-04 ENCOUNTER — Other Ambulatory Visit: Payer: Self-pay

## 2018-03-04 DIAGNOSIS — Z01812 Encounter for preprocedural laboratory examination: Secondary | ICD-10-CM | POA: Insufficient documentation

## 2018-03-04 HISTORY — DX: Pneumonia, unspecified organism: J18.9

## 2018-03-04 HISTORY — DX: Type 2 diabetes mellitus with diabetic neuropathy, unspecified: E11.40

## 2018-03-04 LAB — BASIC METABOLIC PANEL
ANION GAP: 12 (ref 5–15)
BUN: 24 mg/dL — ABNORMAL HIGH (ref 6–20)
CALCIUM: 9.5 mg/dL (ref 8.9–10.3)
CO2: 25 mmol/L (ref 22–32)
Chloride: 104 mmol/L (ref 98–111)
Creatinine, Ser: 0.88 mg/dL (ref 0.44–1.00)
GFR calc Af Amer: >60 mL/min (ref >60)
GLUCOSE: 213 mg/dL — AB (ref 70–99)
Potassium: 4.4 mmol/L (ref 3.5–5.1)
SODIUM: 141 mmol/L (ref 135–145)

## 2018-03-04 NOTE — Patient Instructions (Signed)
Your procedure is scheduled on: Tuesday 03/09/18 Report to [facility]. To find out your arrival time please call [phone] between 1PM - 3PM on Monday 03/08/18.  Remember: Instructions that are not followed completely may result in serious medical risk, up to and including death, or upon the discretion of your surgeon and anesthesiologist your surgery may need to be rescheduled.     _X__ 1. Do not eat food after midnight the night before your procedure.                 No gum chewing or hard candies. You may drink clear liquids up to 2 hours                 before you are scheduled to arrive for your surgery- DO not drink clear                 liquids within 2 hours of the start of your surgery.                 Clear Liquids include:  water, apple juice without pulp, clear carbohydrate                 drink such as Clearfast or Gatorade, Black Coffee or Tea (Do not add                 anything to coffee or tea).  __X__2.  On the morning of surgery brush your teeth with toothpaste and water, you                 may rinse your mouth with mouthwash if you wish.  Do not swallow any              toothpaste of mouthwash.     _X__ 3.  No Alcohol for 24 hours before or after surgery.   _X__ 4.  Do Not Smoke or use e-cigarettes For 24 Hours Prior to Your Surgery.                 Do not use any chewable tobacco products for at least 6 hours prior to                 surgery.  ____  5.  Bring all medications with you on the day of surgery if instructed.   __X__  6.  Notify your doctor if there is any change in your medical condition      (cold, fever, infections).     Do not wear jewelry, make-up, hairpins, clips or nail polish. Do not wear lotions, powders, or perfumes.  Do not shave 48 hours prior to surgery. Men may shave face and neck. Do not bring valuables to the hospital.    [facility] is not responsible for any belongings or  valuables.  Contacts, dentures/partials or body piercings may not be worn into surgery. Bring a case for your contacts, glasses or hearing aids, a denture cup will be supplied. Leave your suitcase in the car. After surgery it may be brought to your room. For patients admitted to the hospital, discharge time is determined by your treatment team.   Patients discharged the day of surgery will not be allowed to drive home.   Please read over the following fact sheets that you were given:   MRSA Information  __X__ Take these medicines the morning of surgery with A SIP OF WATER:  1. ATORVASTATIN  2. GABAPENTIN  3. SERTRALINE  4.  5.  6.  ____ Fleet Enema (as directed)   __X__ Use CHG Soap/SAGE wipes as directed  ____ Use inhalers on the day of surgery  __X__ Stop metformin/Janumet/Farxiga 2 days prior to surgery    ____ Take 1/2 of usual insulin dose the night before surgery. No insulin the morning          of surgery.   ____ Stop Blood Thinners Coumadin/Plavix/Xarelto/Pleta/Pradaxa/Eliquis/Effient/Aspirin  on   Or contact your Surgeon, Cardiologist or Medical Doctor regarding  ability to stop your blood thinners  __X__ Stop Anti-inflammatories 7 days before surgery such as Advil, Ibuprofen, Motrin,  BC or Goodies Powder, Naprosyn, Naproxen, Aleve, Aspirin MAY TAKE TYLENOL   __X__ Stop all herbal supplements, fish oil or vitamin E until after surgery. STOP MELATONIN   ____ Bring C-Pap to the hospital.

## 2018-03-09 ENCOUNTER — Ambulatory Visit: Payer: 59 | Admitting: Certified Registered Nurse Anesthetist

## 2018-03-09 ENCOUNTER — Encounter (HOSPITAL_COMMUNITY): Payer: 59 | Admitting: Occupational Therapy

## 2018-03-09 ENCOUNTER — Ambulatory Visit: Payer: 59

## 2018-03-09 ENCOUNTER — Ambulatory Visit
Admission: RE | Admit: 2018-03-09 | Discharge: 2018-03-09 | Disposition: A | Discharge status: Home / Self Care | Payer: 59 | Source: Ambulatory Visit | Attending: Surgery | Admitting: Surgery

## 2018-03-09 ENCOUNTER — Other Ambulatory Visit: Payer: Self-pay

## 2018-03-09 ENCOUNTER — Encounter
Admission: RE | Disposition: A | Discharge status: Home / Self Care | Payer: Self-pay | Source: Ambulatory Visit | Attending: Surgery

## 2018-03-09 DIAGNOSIS — I10 Essential (primary) hypertension: Secondary | ICD-10-CM | POA: Diagnosis not present

## 2018-03-09 DIAGNOSIS — Z79899 Other long term (current) drug therapy: Secondary | ICD-10-CM | POA: Diagnosis not present

## 2018-03-09 DIAGNOSIS — E119 Type 2 diabetes mellitus without complications: Secondary | ICD-10-CM | POA: Insufficient documentation

## 2018-03-09 DIAGNOSIS — Z803 Family history of malignant neoplasm of breast: Secondary | ICD-10-CM | POA: Insufficient documentation

## 2018-03-09 DIAGNOSIS — N6310 Unspecified lump in the right breast, unspecified quadrant: Secondary | ICD-10-CM | POA: Insufficient documentation

## 2018-03-09 HISTORY — PX: BREAST LUMPECTOMY: SHX2

## 2018-03-09 LAB — GLUCOSE, CAPILLARY
GLUCOSE-CAPILLARY: 147 mg/dL — AB (ref 70–99)
Glucose-Capillary: 163 mg/dL — ABNORMAL HIGH (ref 70–99)

## 2018-03-09 SURGERY — BREAST LUMPECTOMY
Anesthesia: General | Site: Breast | Laterality: Right | Wound class: Clean

## 2018-03-09 MED ORDER — FENTANYL CITRATE (PF) 100 MCG/2ML IJ SOLN
INTRAMUSCULAR | Status: DC | PRN
  Administered 2018-03-09 (×2): 50 ug via INTRAVENOUS

## 2018-03-09 MED ORDER — SODIUM CHLORIDE 0.9 % IV SOLN
INTRAVENOUS | Status: DC
  Administered 2018-03-09: 07:00:00 via INTRAVENOUS

## 2018-03-09 MED ORDER — LIDOCAINE HCL (CARDIAC) PF 100 MG/5ML IV SOSY
PREFILLED_SYRINGE | INTRAVENOUS | Status: DC | PRN
  Administered 2018-03-09: 100 mg via INTRAVENOUS

## 2018-03-09 MED ORDER — FENTANYL CITRATE (PF) 100 MCG/2ML IJ SOLN
INTRAMUSCULAR | Status: AC
  Filled 2018-03-09: qty 2

## 2018-03-09 MED ORDER — EPHEDRINE SULFATE 50 MG/ML IJ SOLN
INTRAMUSCULAR | Status: DC | PRN
  Administered 2018-03-09: 15 mg via INTRAVENOUS
  Administered 2018-03-09: 5 mg via INTRAVENOUS
  Administered 2018-03-09: 10 mg via INTRAVENOUS

## 2018-03-09 MED ORDER — FENTANYL CITRATE (PF) 100 MCG/2ML IJ SOLN: 25.0000 ug | INTRAMUSCULAR | Status: DC | PRN

## 2018-03-09 MED ORDER — FAMOTIDINE 20 MG PO TABS
20.0000 mg | ORAL_TABLET | Freq: Once | ORAL | Status: AC
  Administered 2018-03-09: 20 mg via ORAL

## 2018-03-09 MED ORDER — SUCCINYLCHOLINE CHLORIDE 20 MG/ML IJ SOLN
INTRAMUSCULAR | Status: AC
  Filled 2018-03-09: qty 1

## 2018-03-09 MED ORDER — BUPIVACAINE HCL (PF) 0.5 % IJ SOLN
INTRAMUSCULAR | Status: AC
  Filled 2018-03-09: qty 30

## 2018-03-09 MED ORDER — EPHEDRINE SULFATE 50 MG/ML IJ SOLN
INTRAMUSCULAR | Status: AC
  Filled 2018-03-09: qty 1

## 2018-03-09 MED ORDER — DEXAMETHASONE SODIUM PHOSPHATE 10 MG/ML IJ SOLN
INTRAMUSCULAR | Status: DC | PRN
  Administered 2018-03-09: 10 mg via INTRAVENOUS

## 2018-03-09 MED ORDER — PROPOFOL 10 MG/ML IV BOLUS
INTRAVENOUS | Status: AC
  Filled 2018-03-09: qty 20

## 2018-03-09 MED ORDER — DEXTROSE 5 % IV SOLN
3.0000 g | INTRAVENOUS | Status: AC
  Administered 2018-03-09: 3 g via INTRAVENOUS
  Filled 2018-03-09: qty 3000

## 2018-03-09 MED ORDER — SUCCINYLCHOLINE CHLORIDE 20 MG/ML IJ SOLN
INTRAMUSCULAR | Status: DC | PRN
  Administered 2018-03-09: 100 mg via INTRAVENOUS

## 2018-03-09 MED ORDER — FAMOTIDINE 20 MG PO TABS
ORAL_TABLET | ORAL | Status: AC
  Filled 2018-03-09: qty 1

## 2018-03-09 MED ORDER — LIDOCAINE HCL (PF) 2 % IJ SOLN
INTRAMUSCULAR | Status: AC
  Filled 2018-03-09: qty 10

## 2018-03-09 MED ORDER — PHENYLEPHRINE HCL 10 MG/ML IJ SOLN
INTRAMUSCULAR | Status: AC
  Filled 2018-03-09: qty 1

## 2018-03-09 MED ORDER — ROCURONIUM BROMIDE 100 MG/10ML IV SOLN
INTRAVENOUS | Status: DC | PRN
  Administered 2018-03-09: 5 mg via INTRAVENOUS

## 2018-03-09 MED ORDER — KETOROLAC TROMETHAMINE 30 MG/ML IJ SOLN
INTRAMUSCULAR | Status: DC | PRN
Start: 2018-03-09 — End: 2018-03-09
  Administered 2018-03-09: 30 mg via INTRAVENOUS

## 2018-03-09 MED ORDER — DEXAMETHASONE SODIUM PHOSPHATE 10 MG/ML IJ SOLN
INTRAMUSCULAR | Status: AC
  Filled 2018-03-09: qty 1

## 2018-03-09 MED ORDER — ACETAMINOPHEN 10 MG/ML IV SOLN
INTRAVENOUS | Status: DC | PRN
  Administered 2018-03-09: 1000 mg via INTRAVENOUS

## 2018-03-09 MED ORDER — PROPOFOL 10 MG/ML IV BOLUS
INTRAVENOUS | Status: DC | PRN
  Administered 2018-03-09: 200 mg via INTRAVENOUS

## 2018-03-09 MED ORDER — PHENYLEPHRINE HCL 10 MG/ML IJ SOLN
INTRAMUSCULAR | Status: DC | PRN
  Administered 2018-03-09: 100 ug via INTRAVENOUS
  Administered 2018-03-09: 200 ug via INTRAVENOUS
  Administered 2018-03-09 (×2): 100 ug via INTRAVENOUS

## 2018-03-09 MED ORDER — CHLORHEXIDINE GLUCONATE CLOTH 2 % EX PADS: 6.0000 | MEDICATED_PAD | Freq: Once | CUTANEOUS | Status: DC

## 2018-03-09 MED ORDER — LIDOCAINE HCL 1 % IJ SOLN
INTRAMUSCULAR | Status: DC | PRN
  Administered 2018-03-09: 4 mL

## 2018-03-09 MED ORDER — ONDANSETRON HCL 4 MG/2ML IJ SOLN: 4.0000 mg | Freq: Once | INTRAMUSCULAR | Status: DC | PRN

## 2018-03-09 MED ORDER — ACETAMINOPHEN 10 MG/ML IV SOLN
INTRAVENOUS | Status: AC
  Filled 2018-03-09: qty 100

## 2018-03-09 MED ORDER — ROCURONIUM BROMIDE 50 MG/5ML IV SOLN
INTRAVENOUS | Status: AC
  Filled 2018-03-09: qty 1

## 2018-03-09 MED ORDER — MIDAZOLAM HCL 2 MG/2ML IJ SOLN
INTRAMUSCULAR | Status: AC
  Filled 2018-03-09: qty 2

## 2018-03-09 MED ORDER — MIDAZOLAM HCL 2 MG/2ML IJ SOLN
INTRAMUSCULAR | Status: DC | PRN
  Administered 2018-03-09: 2 mg via INTRAVENOUS

## 2018-03-09 MED ORDER — LIDOCAINE HCL (PF) 1 % IJ SOLN
INTRAMUSCULAR | Status: AC
  Filled 2018-03-09: qty 30

## 2018-03-09 MED ORDER — ONDANSETRON HCL 4 MG/2ML IJ SOLN
INTRAMUSCULAR | Status: AC
  Filled 2018-03-09: qty 2

## 2018-03-09 MED ORDER — ONDANSETRON HCL 4 MG/2ML IJ SOLN
INTRAMUSCULAR | Status: DC | PRN
  Administered 2018-03-09: 4 mg via INTRAVENOUS

## 2018-03-09 SURGICAL SUPPLY — 44 items
APPLIER CLIP 11 MED OPEN (CLIP)
BLADE SURG 15 STRL LF DISP TIS (BLADE) ×1 IMPLANT
BLADE SURG 15 STRL SS (BLADE) ×1
CANISTER SUCT 1200ML W/VALVE (MISCELLANEOUS) ×2 IMPLANT
CHLORAPREP W/TINT 26ML (MISCELLANEOUS) ×2 IMPLANT
CLIP APPLIE 11 MED OPEN (CLIP) IMPLANT
CNTNR SPEC 2.5X3XGRAD LEK (MISCELLANEOUS)
CONT SPEC 4OZ STER OR WHT (MISCELLANEOUS)
CONTAINER SPEC 2.5X3XGRAD LEK (MISCELLANEOUS) IMPLANT
COVER LIGHT HANDLE STERIS (MISCELLANEOUS) ×2 IMPLANT
COVER PROBE FLX POLY STRL (MISCELLANEOUS) ×2 IMPLANT
DERMABOND ADVANCED (GAUZE/BANDAGES/DRESSINGS) ×1
DERMABOND ADVANCED .7 DNX12 (GAUZE/BANDAGES/DRESSINGS) ×1 IMPLANT
DEVICE DUBIN SPECIMEN MAMMOGRA (MISCELLANEOUS) ×6 IMPLANT
DRAPE CHEST BREAST 77X106 FENE (MISCELLANEOUS) IMPLANT
DRAPE IMP U-DRAPE 54X76 (DRAPES) IMPLANT
DRAPE LAPAROTOMY 77X122 PED (DRAPES) ×2 IMPLANT
DRAPE SHEET LG 3/4 BI-LAMINATE (DRAPES) ×2 IMPLANT
ELECT REM PT RETURN 9FT ADLT (ELECTROSURGICAL) ×2
ELECTRODE REM PT RTRN 9FT ADLT (ELECTROSURGICAL) ×1 IMPLANT
GAUZE SPONGE 4X4 12PLY STRL (GAUZE/BANDAGES/DRESSINGS) ×2 IMPLANT
GLOVE BIO SURGEON STRL SZ 6.5 (GLOVE) ×2 IMPLANT
GLOVE INDICATOR 7.0 STRL GRN (GLOVE) ×2 IMPLANT
GOWN STRL REUS W/ TWL LRG LVL3 (GOWN DISPOSABLE) ×3 IMPLANT
GOWN STRL REUS W/TWL LRG LVL3 (GOWN DISPOSABLE) ×3
JACKSON PRATT 10 (INSTRUMENTS) IMPLANT
KIT TURNOVER KIT A (KITS) ×2 IMPLANT
LABEL OR SOLS (LABEL) ×2 IMPLANT
MARGIN MAP 10MM (MISCELLANEOUS) IMPLANT
NEEDLE HYPO 25X1 1.5 SAFETY (NEEDLE) ×4 IMPLANT
PACK BASIN MINOR ARMC (MISCELLANEOUS) ×2 IMPLANT
STOCKINETTE STRL 6IN 960660 (GAUZE/BANDAGES/DRESSINGS) ×2 IMPLANT
SUCTION FRAZIER TIP 10 FR DISP (SUCTIONS) ×2 IMPLANT
SUT MNCRL 4-0 (SUTURE) ×1
SUT MNCRL 4-0 27XMFL (SUTURE) ×1
SUT SILK 2 0 (SUTURE)
SUT SILK 2-0 30XBRD TIE 12 (SUTURE) IMPLANT
SUT SILK 3 0 12 30 (SUTURE) ×4 IMPLANT
SUT VIC AB 3-0 SH 27 (SUTURE) ×1
SUT VIC AB 3-0 SH 27X BRD (SUTURE) ×1 IMPLANT
SUTURE MNCRL 4-0 27XMF (SUTURE) ×1 IMPLANT
SYR 10ML LL (SYRINGE) ×2 IMPLANT
SYR 50ML LL SCALE MARK (SYRINGE) IMPLANT
WATER STERILE IRR 1000ML POUR (IV SOLUTION) ×2 IMPLANT

## 2018-03-09 NOTE — OR Nursing (Signed)
Discussed discharge instructions with pt and husband. Both voice understanding. 

## 2018-03-09 NOTE — Anesthesia Post-op Follow-up Note (Signed)
Anesthesia QCDR form completed.        

## 2018-03-09 NOTE — Anesthesia Preprocedure Evaluation (Signed)
Anesthesia Evaluation  Patient identified by MRN, date of birth, ID band Patient awake    Reviewed: Allergy & Precautions, NPO status , Patient's Chart, lab work & pertinent test results  Airway Mallampati: III  TM Distance: <3 FB     Dental  (+) Teeth Intact   Pulmonary sleep apnea , pneumonia, resolved,    Pulmonary exam normal        Cardiovascular hypertension, Normal cardiovascular exam     Neuro/Psych Anxiety negative neurological ROS     GI/Hepatic negative GI ROS, (+) Hepatitis -, Unspecified  Endo/Other  diabetes  Renal/GU negative Renal ROS  negative genitourinary   Musculoskeletal  (+) Arthritis ,   Abdominal Normal abdominal exam  (+)   Peds negative pediatric ROS (+)  Hematology  (+) anemia ,   Anesthesia Other Findings   Reproductive/Obstetrics                             Anesthesia Physical Anesthesia Plan  ASA: II  Anesthesia Plan: General   Post-op Pain Management:    Induction: Intravenous  PONV Risk Score and Plan:   Airway Management Planned: LMA and Oral ETT  Additional Equipment:   Intra-op Plan:   Post-operative Plan: Extubation in OR  Informed Consent: I have reviewed the patients History and Physical, chart, labs and discussed the procedure including the risks, benefits and alternatives for the proposed anesthesia with the patient or authorized representative who has indicated his/her understanding and acceptance.   Dental advisory given  Plan Discussed with: CRNA and Surgeon  Anesthesia Plan Comments:         Anesthesia Quick Evaluation

## 2018-03-09 NOTE — Brief Op Note (Signed)
03/09/2018  9:20 AM  PATIENT:  Rachel Huber  62 y.o. female  PRE-OPERATIVE DIAGNOSIS:  mass of multiple sites of right breast  POST-OPERATIVE DIAGNOSIS:  mass of multiple sites of right breast  PROCEDURE:  Procedure(s): BREAST LUMPECTOMY x 2 (Right)  SURGEON:  Surgeon(s) and Role:    * Aren Cherne, DO - Primary  PHYSICIAN ASSISTANT: none  ASSISTANTS: none   ANESTHESIA:   general  EBL:  5 mL   BLOOD ADMINISTERED:none  DRAINS: none   LOCAL MEDICATIONS USED:  MARCAINE    and LIDOCAINE   SPECIMEN:  Lumpectomy x2  DISPOSITION OF SPECIMEN:  PATHOLOGY  COUNTS:  YES  TOURNIQUET:  * No tourniquets in log *  DICTATION: .Note written in EPIC  PLAN OF CARE: Discharge to home after PACU  PATIENT DISPOSITION:  PACU - hemodynamically stable.   Delay start of Pharmacological VTE agent (>24hrs) due to surgical blood loss or risk of bleeding: not applicable

## 2018-03-09 NOTE — Anesthesia Procedure Notes (Signed)
Procedure Name: Intubation Date/Time: 03/09/2018 7:34 AM Performed by: Johnna Acosta, CRNA Pre-anesthesia Checklist: Patient identified, Emergency Drugs available, Patient being monitored, Suction available and Timeout performed Patient Re-evaluated:Patient Re-evaluated prior to induction Oxygen Delivery Method: Circle system utilized Preoxygenation: Pre-oxygenation with 100% oxygen Induction Type: IV induction Ventilation: Mask ventilation with difficulty, Oral airway inserted - appropriate to patient size and Two handed mask ventilation required Laryngoscope Size: Miller and 2 Grade View: Grade I Tube type: Oral Tube size: 7.5 mm Number of attempts: 1 Airway Equipment and Method: Stylet Placement Confirmation: ETT inserted through vocal cords under direct vision,  positive ETCO2 and breath sounds checked- equal and bilateral Secured at: 22 cm Tube secured with: Tape Dental Injury: Teeth and Oropharynx as per pre-operative assessment  Difficulty Due To: Difficulty was anticipated

## 2018-03-09 NOTE — Transfer of Care (Signed)
Immediate Anesthesia Transfer of Care Note  Patient: Rachel Huber  Procedure(s) Performed: BREAST LUMPECTOMY x 2 (Right Breast)  Patient Location: PACU  Anesthesia Type:General  Level of Consciousness: sedated  Airway & Oxygen Therapy: Patient Spontanous Breathing and Patient connected to face mask oxygen  Post-op Assessment: Report given to RN and Post -op Vital signs reviewed and stable  Post vital signs: Reviewed and stable  Last Vitals:  Vitals Value Taken Time  BP 131/57 03/09/2018  9:23 AM  Temp 36.3 C 03/09/2018  9:23 AM  Pulse 85 03/09/2018  9:23 AM  Resp 18 03/09/2018  9:23 AM  SpO2 98 % 03/09/2018  9:23 AM  Vitals shown include unvalidated device data.  Last Pain:  Vitals:   03/09/18 0923  TempSrc: Temporal  PainSc:          Complications: No apparent anesthesia complications

## 2018-03-09 NOTE — Interval H&P Note (Signed)
History and Physical Interval Note:  03/09/2018 7:04 AM  Rachel Huber  has presented today for surgery, with the diagnosis of mass of multiple sites of right breast  The various methods of treatment have been discussed with the patient and family. After consideration of risks, benefits and other options for treatment, the patient has consented to  Procedure(s): BREAST LUMPECTOMY (Right) as a surgical intervention .  The patient's history has been reviewed, patient examined, no change in status, stable for surgery.  I have reviewed the patient's chart and labs.  Questions were answered to the patient's satisfaction.     Cornelis Kluver Lysle Pearl

## 2018-03-09 NOTE — Discharge Instructions (Signed)
Breast Biopsy, Care After °These instructions give you information about caring for yourself after your procedure. Your doctor may also give you more specific instructions. Call your doctor if you have any problems or questions after your procedure. °Follow these instructions at home: °Medicines °· Take over-the-counter and prescription medicines only as told by your doctor. °· Do not drive for 24 hours if you received a sedative. °· Do not drink alcohol while taking pain medicine. °· Do not drive or use heavy machinery while taking prescription pain medicine. °Biopsy Site Care ° °· Follow instructions from your doctor about how to take care of your cut from surgery (incision) or puncture area. Make sure you: °? Wash your hands with soap and water before you change your bandage. If you cannot use soap and water, use hand sanitizer. °? Change any bandages (dressings) as told by your doctor. °? Leave any stitches (sutures), skin glue, or skin tape (adhesive) strips in place. They may need to stay in place for 2 weeks or longer. If tape strips get loose and curl up, you may trim the loose edges. Do not remove tape strips completely unless your doctor says it is okay. °· If you have stitches, keep them dry when you take a bath or a shower. °· Check your cut or puncture area every day for signs of infection. Check for: °? More redness, swelling, or pain. °? More fluid or blood. °? Warmth. °? Pus or a bad smell. °· Protect the biopsy area. Do not let the area get bumped. °Activity °· Avoid activities that could pull the biopsy site open. °? Avoid stretching. °? Avoid reaching. °? Avoid exercise. °? Avoid sports. °? Avoid lifting anything that is heavier than 3 pounds (1.4 kg). °· Return to your normal activities as told by your doctor. Ask your doctor what activities are safe for you. °General instructions °· Continue your normal diet. °· Wear a good support bra for as long as told by your doctor. °· Get checked for extra  fluid in your body (lymphedema) as often as told by your doctor. °· Keep all follow-up visits as told by your doctor. This is important. °Contact a health care provider if: °· You have more redness, swelling, or pain at the biopsy site. °· You have more fluid or blood coming from your biopsy site. °· Your biopsy site feels warm to the touch. °· You have pus or a bad smell coming from the biopsy site. °· Your biopsy site breaks open after the stitches, staples, or skin tape strips have been removed. °· You have a rash. °· You have a fever. °Get help right away if: °· You have more bleeding (more than a small spot) from the biopsy site. °· You have trouble breathing. °· You have red streaks around the biopsy site. °This information is not intended to replace advice given to you by your health care provider. Make sure you discuss any questions you have with your health care provider. ° °AMBULATORY SURGERY  °DISCHARGE INSTRUCTIONS ° ° °1) The drugs that you were given will stay in your system until tomorrow so for the next 24 hours you should not: ° °A) Drive an automobile °B) Make any legal decisions °C) Drink any alcoholic beverage ° ° °2) You may resume regular meals tomorrow.  Today it is better to start with liquids and gradually work up to solid foods. ° °You may eat anything you prefer, but it is better to start with liquids, then   soup and crackers, and gradually work up to solid foods. ° ° °3) Please notify your doctor immediately if you have any unusual bleeding, trouble breathing, redness and pain at the surgery site, drainage, fever, or pain not relieved by medication. ° ° ° °4) Additional Instructions: ° ° ° ° ° ° ° °Please contact your physician with any problems or Same Day Surgery at [phone], Monday through Friday 6 am to 4 pm, or [facility] [place] Main number at [phone]. °Document Released: 05/31/2009 Document Revised: 04/10/2016 Document Reviewed: 05/08/2015 °Elsevier Interactive Patient  Education © 2018 [employer] ° ° ° °

## 2018-03-09 NOTE — Anesthesia Postprocedure Evaluation (Signed)
Anesthesia Post Note  Patient: Rachel Huber  Procedure(s) Performed: BREAST LUMPECTOMY x 2 (Right Breast)  Patient location during evaluation: PACU Anesthesia Type: General Level of consciousness: awake and alert and oriented Pain management: pain level controlled Vital Signs Assessment: post-procedure vital signs reviewed and stable Respiratory status: spontaneous breathing Cardiovascular status: blood pressure returned to baseline Anesthetic complications: no     Last Vitals:  Vitals:   03/09/18 1035 03/09/18 1100  BP: (!) 121/53 (!) 110/53  Pulse: 81 81  Resp: 16 16  Temp: (!) 36.2 C   SpO2: 92%     Last Pain:  Vitals:   03/09/18 1035  TempSrc:   PainSc: 2                  [name]

## 2018-03-10 ENCOUNTER — Telehealth (HOSPITAL_COMMUNITY): Payer: Self-pay | Admitting: Occupational Therapy

## 2018-03-10 ENCOUNTER — Ambulatory Visit (HOSPITAL_COMMUNITY): Payer: 59 | Admitting: Occupational Therapy

## 2018-03-10 LAB — SURGICAL PATHOLOGY

## 2018-03-10 NOTE — Telephone Encounter (Signed)
Patient had a procedure done and can't make it today

## 2018-03-10 NOTE — Op Note (Signed)
Preoperative diagnosis: breast mass x2 Postoperative diagnosis: same.   Procedure: excisional biopsy of right breast mass x2  Anesthesia: GETA  Surgeon: Lysle Pearl Wound Classification: Clean  Indications: Patient is a 62 y.o. female who had an abnormal mammogram that on workup with core needle biopsy was found to be discordant with imaging findings.    Findings: 1. Successful removal of mass with previously inserted clips. 2. Old hematomas noted around palpable mass  Description of procedure: The patient was brought to the operating room and general anesthesia was induced. A time-out was completed verifying correct patient, procedure, site, positioning, and implant(s) and/or special equipment prior to beginning this procedure. The breast, chest wall, axilla, and upper arm and neck were prepped and draped in the usual sterile fashion.  A skin incision was made after local infusion at the areolar border closest to the  9:00 clock concerning mass.  Dissection carried down to the mass within the breast tissue and removed completely.  specimen labeled long lateral, short superior, double deep sutures, and sent to radiology for confirmation of clip within specimen. Specimen noted to have what looked like old hematoma discharge upon gross exam.  While awaiting radiology, another incision was made after local infusion at the areolar border closest to the 6:00 position concerning mass.  What felt like a palpable mass was removed completely and labeled identically with sutures and sent to rads for clip confirmation.  9 oclock specimen noted to have clips within it, so mass was sent to pathology for eval.  The initial 6:00 specimen noted to not have clip in place.  Intraoperative [place] used to pinpoint the clip to be noted within the inferior border of wound, so this was resected and specimen resent to rads for confirmation.  The second specimen did contain the clip so this was sent to pathology for further eval.     Both wounds then confirmed hemostatis, and closed with interrupted 3-0  Vicryl to the subcutaneous layer, followed by a subcuticular layer of Monocryl 4-0. The wound was dressed with dermabond. The patient tolerated the procedure well and was taken to the postanesthesia care unit in stable condition.   Specimen: Right breast mass x 2  Complications: None  Estimated Blood Loss: 20 mL

## 2018-03-11 ENCOUNTER — Encounter (HOSPITAL_COMMUNITY): Payer: Self-pay

## 2018-03-11 ENCOUNTER — Other Ambulatory Visit: Payer: Self-pay

## 2018-03-11 ENCOUNTER — Ambulatory Visit (HOSPITAL_COMMUNITY): Payer: 59

## 2018-03-11 DIAGNOSIS — R262 Difficulty in walking, not elsewhere classified: Secondary | ICD-10-CM | POA: Diagnosis not present

## 2018-03-11 DIAGNOSIS — M25611 Stiffness of right shoulder, not elsewhere classified: Secondary | ICD-10-CM

## 2018-03-11 DIAGNOSIS — R29898 Other symptoms and signs involving the musculoskeletal system: Secondary | ICD-10-CM

## 2018-03-11 DIAGNOSIS — M25511 Pain in right shoulder: Secondary | ICD-10-CM

## 2018-03-11 NOTE — Therapy (Addendum)
[place], [place], [address] Phone: [phone]   Fax:  [phone]  Occupational Therapy Treatment  Patient Details  Name: Rachel Huber MRN: [id number] Date of Birth: 11-May-1956 Referring Provider: Dr. Frazier Richards   Encounter Date: 03/11/2018  OT End of Session - 03/11/18 1144    Visit Number  17    Number of Visits  24    Date for OT Re-Evaluation  04/02/18    Authorization Type  [employer] - following CPT codes allowed:  eval, self care, manual therapy, therapeutic exercise    Authorization - Visit Number  17    Authorization - Number of Visits  60    OT Start Time  1117    OT Stop Time  1200    OT Time Calculation (min)  43 min    Activity Tolerance  Patient tolerated treatment well    Behavior During Therapy  [facility] for tasks assessed/performed       Past Medical History:  Diagnosis Date  . Anxiety   . Arthritis   . Diabetes mellitus without complication ([place])   . Hepatitis    PAST  . Hypertension   . Neuropathy   . Neuropathy, diabetic ([place])   . Pneumonia   . Sinus complaint   . Sleep apnea    CPAP  . Thalassemia minor     Past Surgical History:  Procedure Laterality Date  . ABDOMINAL HYSTERECTOMY    . BREAST BIOPSY Right 02/15/2018   [place] bx 6-6:30 ribbon shape, ONE CORE FRAGMENT WITH FIBROSIS. ONE CORE FRAGMENT WITH PORTION OF A DILATED  . BREAST BIOPSY Right 02/15/2018   [place] bx 9:00 heart shape, USUAL DUCTAL HYPERPLASIA  . BREAST LUMPECTOMY Right 03/09/2018   Procedure: BREAST LUMPECTOMY x 2;  Surgeon: Benjamine Sprague, DO;  Location: [facility] ORS;  Service: General;  Laterality: Right;  . CATARACT EXTRACTION W/PHACO Left 06/11/2016   Procedure: CATARACT EXTRACTION PHACO AND INTRAOCULAR LENS PLACEMENT (IOC);  Surgeon: Estill Cotta, MD;  Location: [facility] ORS;  Service: Ophthalmology;  Laterality: Left;  Lot # [id number] H US:01:38.6 AP%:26.4 CDE:44.15  . COLONOSCOPIES    . CTR    . KNEE SURGERY  Right 09/24/2017   plates and pins  . TOTAL SHOULDER ARTHROPLASTY Right 09/27/2015    There were no vitals filed for this visit.  Subjective Assessment - 03/11/18 1137    Subjective   S: It is feeling extra sore today. It may have been the way they had me laying on the table during the surgery.    Currently in Pain?  Yes    Pain Score  3     Pain Location  Shoulder    Pain Orientation  Right    Pain Descriptors / Indicators  Aching;Sore    Pain Type  Chronic pain    Pain Radiating Towards  N/A    Pain Onset  More than a month ago    Pain Frequency  Intermittent    Aggravating Factors   certain movements    Pain Relieving Factors  rest, heat/ice, pain medications    Effect of Pain on Daily Activities  minimal effect on ADLs    Multiple Pain Sites  No         [facility] OT Assessment - 03/11/18 1139      Assessment   Medical Diagnosis  S/P Right Total Shoulder Replacement      Precautions   Precautions  None  OT Treatments/Exercises (OP) - 03/11/18 1140      Exercises   Exercises  Shoulder      Shoulder Exercises: Supine   Protraction  PROM;5 reps;Strengthening;10 reps    Protraction Weight (lbs)  2    Horizontal ABduction  PROM;5 reps;Strengthening;10 reps    Horizontal ABduction Weight (lbs)  1    External Rotation  PROM;5 reps;Strengthening;15 reps    External Rotation Weight (lbs)  1    Internal Rotation  PROM;5 reps;Strengthening;15 reps    Internal Rotation Weight (lbs)  1    Flexion  PROM;5 reps;Strengthening;10 reps    Shoulder Flexion Weight (lbs)  1    ABduction  PROM;5 reps;Strengthening;10 reps    Shoulder ABduction Weight (lbs)  1      Shoulder Exercises: Sidelying   External Rotation  Strengthening;15 reps    External Rotation Weight (lbs)  1    Internal Rotation  Strengthening;15 reps    Internal Rotation Weight (lbs)  1    Flexion  Strengthening;15 reps    Flexion Weight (lbs)  1    ABduction  Strengthening;15 reps     ABduction Weight (lbs)  1    Other Sidelying Exercises  protraction; strengthening; 15X; 1#    Other Sidelying Exercises  horizontal abduction; strengthening; 15X; 1#      Shoulder Exercises: Standing   Other Standing Exercises  Patient passed cone around head 15X to work on external rotation.      Shoulder Exercises: ROM/Strengthening   Proximal Shoulder Strengthening, Supine  10X; 1# with no rest breaks      Shoulder Exercises: Stretch   External Rotation Stretch  2 reps 15"      Manual Therapy   Manual Therapy  Myofascial release    Manual therapy comments  completed seperately from all other skilled interventions    Myofascial Release  myofascial release and manual stretching to right upper arm, scapular, shoulder region  and scar region to decrease fascial restrictions and improve pain free mobility in right arm and shoulder             OT Education - 03/11/18 1144    Education provided  No       OT Short Term Goals - 03/11/18 1146      OT SHORT TERM GOAL #1   Title  Patient will be educated on HEP for improved RUE functional use.    Time  4    Period  Weeks    Status  On-going      OT SHORT TERM GOAL #2   Title  Patient will improve right shoulder P/ROM to [facility] in order to improve ease of donning and doffing clothing.     Time  4    Period  Weeks      OT SHORT TERM GOAL #3   Title  Patient will increase right shoulder strength to 4/5 in order to improve ability to lift bags of groceries.     Time  4    Period  Weeks    Status  Partially Met      OT SHORT TERM GOAL #4   Title  Patient will decrease fascial and scar restrictions from moderate to min moderate for improved mobility needed for increased participation in work tasks.     Time  4    Period  Weeks      OT SHORT TERM GOAL #5   Title  Patient will decrease pain to 3/10 in right arm and  shoulder during ADL completion.     Time  4    Period  Weeks        OT Long Term Goals - 03/11/18 1146       OT LONG TERM GOAL #1   Title  Patient will use her right arm as dominant with all B/IADLs, work, leisure, and driving tasks.     Time  8    Period  Weeks    Status  On-going      OT LONG TERM GOAL #2   Title  Patient will increase right shoulder A/ROM to [facility] in order to reach behind back to fasten bra and reach overhead with out difficulty.     Time  8    Period  Weeks      OT LONG TERM GOAL #3   Title  Patient will increase right shoulder strength to 5/5 in order to lift items at work and when completing leisure activities.     Time  8    Period  Weeks    Status  On-going      OT LONG TERM GOAL #4   Title  Patient will decrease pain to 2/10 or less in her right shoulder when reaching overhead.    Time  8    Period  Weeks      OT LONG TERM GOAL #5   Title  Patient will decrese fascial restrictions to minimal in order to improve moblity needed to complete daily activities without pain.     Time  8    Period  Weeks    Status  On-going            Plan - 03/11/18 1148    Clinical Impression Statement  A: Manual techniques completed due to increased reports of soreness this session. Min/mod fascial restrictions in anterior upper arm. Patient requesting to take it easy this session due to some irritation of unrelated surgical site with overhead arm movement. Continued with strengthening this session in supine and sidelying. Attempted to increase weight and reps for supine strengthening but patient unable to complete for all exercises. Patient demonstrating increased difficulty and fatigue with exercises this session. Extra time required due to pain and fatigue. Patient demonstrating approximately 80% ROM during strengthening exercises. External rotation stretch and cone passing activity added to address ROM deficits.    Plan  P: Continue with manual techniques to address fascial restrictions in anterior upper arm. Attempt supine strengthening with 2# if tolerated. Resume with standing  strengthening. Continue working on scapular stability.    Consulted and Agree with Plan of Care  Patient       Patient will benefit from skilled therapeutic intervention in order to improve the following deficits and impairments:  Pain, Decreased scar mobility, Increased fascial restrictions, Increased muscle spasms, Decreased strength, Decreased range of motion, Impaired UE functional use  Visit Diagnosis: Other symptoms and signs involving the musculoskeletal system  Acute pain of right shoulder  Stiffness of right shoulder, not elsewhere classified    Problem List There are no active problems to display for this patient.   Roderic Palau, OT student 03/11/2018, 12:07 PM  [place], [place], [address] Phone: [phone]   Fax:  [phone]  Name: Rachel Huber MRN: [id number] Date of Birth: 1956-07-27

## 2018-03-16 ENCOUNTER — Encounter (HOSPITAL_COMMUNITY): Payer: Self-pay | Admitting: Occupational Therapy

## 2018-03-16 ENCOUNTER — Ambulatory Visit (HOSPITAL_COMMUNITY): Payer: 59 | Admitting: Occupational Therapy

## 2018-03-16 DIAGNOSIS — M25611 Stiffness of right shoulder, not elsewhere classified: Secondary | ICD-10-CM

## 2018-03-16 DIAGNOSIS — R262 Difficulty in walking, not elsewhere classified: Secondary | ICD-10-CM | POA: Diagnosis not present

## 2018-03-16 DIAGNOSIS — M25511 Pain in right shoulder: Secondary | ICD-10-CM

## 2018-03-16 DIAGNOSIS — R29898 Other symptoms and signs involving the musculoskeletal system: Secondary | ICD-10-CM

## 2018-03-16 NOTE — Therapy (Addendum)
[place], [place], [address] Phone: [phone]   Fax:  [phone]  Occupational Therapy Treatment  Patient Details  Name: Rachel Huber MRN: [id number] Date of Birth: 1956/01/28 Referring Provider: Dr. Frazier Richards   Encounter Date: 03/16/2018  OT End of Session - 03/16/18 1213    Visit Number  18    Number of Visits  24    Date for OT Re-Evaluation  04/02/18    Authorization Type  [employer] - following CPT codes allowed:  eval, self care, manual therapy, therapeutic exercise    Authorization - Visit Number  18    Authorization - Number of Visits  36    OT Start Time  2111 pt in restroom at beginning of session    OT Stop Time  1203    OT Time Calculation (min)  39 min    Activity Tolerance  Patient tolerated treatment well    Behavior During Therapy  [facility] for tasks assessed/performed       Past Medical History:  Diagnosis Date  . Anxiety   . Arthritis   . Diabetes mellitus without complication ([place])   . Hepatitis    PAST  . Hypertension   . Neuropathy   . Neuropathy, diabetic ([place])   . Pneumonia   . Sinus complaint   . Sleep apnea    CPAP  . Thalassemia minor     Past Surgical History:  Procedure Laterality Date  . ABDOMINAL HYSTERECTOMY    . BREAST BIOPSY Right 02/15/2018   [place] bx 6-6:30 ribbon shape, ONE CORE FRAGMENT WITH FIBROSIS. ONE CORE FRAGMENT WITH PORTION OF A DILATED  . BREAST BIOPSY Right 02/15/2018   [place] bx 9:00 heart shape, USUAL DUCTAL HYPERPLASIA  . BREAST LUMPECTOMY Right 03/09/2018   Procedure: BREAST LUMPECTOMY x 2;  Surgeon: Benjamine Sprague, DO;  Location: [facility] ORS;  Service: General;  Laterality: Right;  . CATARACT EXTRACTION W/PHACO Left 06/11/2016   Procedure: CATARACT EXTRACTION PHACO AND INTRAOCULAR LENS PLACEMENT (IOC);  Surgeon: Estill Cotta, MD;  Location: [facility] ORS;  Service: Ophthalmology;  Laterality: Left;  Lot # [id number] H US:01:38.6 AP%:26.4 CDE:44.15  .  COLONOSCOPIES    . CTR    . KNEE SURGERY Right 09/24/2017   plates and pins  . TOTAL SHOULDER ARTHROPLASTY Right 09/27/2015    There were no vitals filed for this visit.  Subjective Assessment - 03/16/18 1120    Subjective   S: The top of the shoulder is still sore.    Currently in Pain?  Yes    Pain Score  2     Pain Location  Shoulder    Pain Orientation  Right    Pain Descriptors / Indicators  Aching;Sore    Pain Type  Acute pain    Pain Radiating Towards  N/A    Pain Onset  More than a month ago    Pain Frequency  Intermittent    Aggravating Factors   certain movements    Pain Relieving Factors  rest, heat, pain medications    Effect of Pain on Daily Activities  minimal effect on ADLs    Multiple Pain Sites  No         [facility] OT Assessment - 03/16/18 1119      Assessment   Medical Diagnosis  S/P Right Total Shoulder Replacement      Precautions   Precautions  None  OT Treatments/Exercises (OP) - 03/16/18 1125      Exercises   Exercises  Shoulder      Shoulder Exercises: Supine   Protraction  PROM;5 reps;Strengthening;10 reps    Protraction Weight (lbs)  2    Horizontal ABduction  PROM;5 reps;Strengthening;10 reps    Horizontal ABduction Weight (lbs)  1    External Rotation  PROM;5 reps;Strengthening;10 reps    External Rotation Weight (lbs)  2    Internal Rotation  PROM;5 reps;Strengthening;10 reps    Internal Rotation Weight (lbs)  2    Flexion  PROM;5 reps;Strengthening;10 reps    Shoulder Flexion Weight (lbs)  2    ABduction  PROM;5 reps;Strengthening;10 reps    Shoulder ABduction Weight (lbs)  1      Shoulder Exercises: Sidelying   External Rotation  Strengthening;15 reps    External Rotation Weight (lbs)  1    Internal Rotation  Strengthening;15 reps    Internal Rotation Weight (lbs)  1    Flexion  Strengthening;15 reps    Flexion Weight (lbs)  1    ABduction  Strengthening;15 reps    ABduction Weight (lbs)  1    Other  Sidelying Exercises  protraction; strengthening; 15X; 1#    Other Sidelying Exercises  horizontal abduction; strengthening; 15X; 1#      Shoulder Exercises: Standing   Other Standing Exercises  Patient passed cone around head 15X to work on external rotation.    Other Standing Exercises  Pt passed tennis ball around back 10X working on IR      Shoulder Exercises: ROM/Strengthening   X to V Arms  12X; 1#      Manual Therapy   Manual Therapy  Myofascial release    Manual therapy comments  completed seperately from all other skilled interventions    Myofascial Release  myofascial release and manual stretching to right upper arm, scapular, shoulder region  and scar region to decrease fascial restrictions and improve pain free mobility in right arm and shoulder               OT Short Term Goals - 03/11/18 1146      OT SHORT TERM GOAL #1   Title  Patient will be educated on HEP for improved RUE functional use.    Time  4    Period  Weeks    Status  On-going      OT SHORT TERM GOAL #2   Title  Patient will improve right shoulder P/ROM to [facility] in order to improve ease of donning and doffing clothing.     Time  4    Period  Weeks      OT SHORT TERM GOAL #3   Title  Patient will increase right shoulder strength to 4/5 in order to improve ability to lift bags of groceries.     Time  4    Period  Weeks    Status  Partially Met      OT SHORT TERM GOAL #4   Title  Patient will decrease fascial and scar restrictions from moderate to min moderate for improved mobility needed for increased participation in work tasks.     Time  4    Period  Weeks      OT SHORT TERM GOAL #5   Title  Patient will decrease pain to 3/10 in right arm and shoulder during ADL completion.     Time  4    Period  Weeks  OT Long Term Goals - 03/11/18 1146      OT LONG TERM GOAL #1   Title  Patient will use her right arm as dominant with all B/IADLs, work, leisure, and driving tasks.     Time  8     Period  Weeks    Status  On-going      OT LONG TERM GOAL #2   Title  Patient will increase right shoulder A/ROM to [facility] in order to reach behind back to fasten bra and reach overhead with out difficulty.     Time  8    Period  Weeks      OT LONG TERM GOAL #3   Title  Patient will increase right shoulder strength to 5/5 in order to lift items at work and when completing leisure activities.     Time  8    Period  Weeks    Status  On-going      OT LONG TERM GOAL #4   Title  Patient will decrease pain to 2/10 or less in her right shoulder when reaching overhead.    Time  8    Period  Weeks      OT LONG TERM GOAL #5   Title  Patient will decrese fascial restrictions to minimal in order to improve moblity needed to complete daily activities without pain.     Time  8    Period  Weeks    Status  On-going            Plan - 03/16/18 1202    Clinical Impression Statement  A: Manual therapy completed to anterior shoulder to decrease fascial restrictions causing increased pain and limiting ROM. Pt able to use 2# with supine flexion and er/IR this session, continued sidelying exercises. Did not resume strengthening in standing due to time constraints. Verbal cuing for form and technique.     Plan  P: manual therapy to anterior shoulder prn. discontinue supine strengthening for time purposes, resume standing strengthening and add/resume loop band       Patient will benefit from skilled therapeutic intervention in order to improve the following deficits and impairments:  Pain, Decreased scar mobility, Increased fascial restrictions, Increased muscle spasms, Decreased strength, Decreased range of motion, Impaired UE functional use  Visit Diagnosis: Other symptoms and signs involving the musculoskeletal system  Acute pain of right shoulder  Stiffness of right shoulder, not elsewhere classified    Problem List There are no active problems to display for this patient.  Rachel Huber, Rachel Huber  [phone] 03/16/2018, 12:13 PM  [place] [address] [place], [place], [address] Phone: [phone]   Fax:  [phone]  Name: Rachel Huber MRN: [id number] Date of Birth: 18-Oct-1955

## 2018-03-17 ENCOUNTER — Encounter (HOSPITAL_COMMUNITY): Payer: Self-pay | Admitting: Occupational Therapy

## 2018-03-17 ENCOUNTER — Ambulatory Visit (HOSPITAL_COMMUNITY): Payer: 59 | Admitting: Occupational Therapy

## 2018-03-17 DIAGNOSIS — M25611 Stiffness of right shoulder, not elsewhere classified: Secondary | ICD-10-CM

## 2018-03-17 DIAGNOSIS — R262 Difficulty in walking, not elsewhere classified: Secondary | ICD-10-CM | POA: Diagnosis not present

## 2018-03-17 DIAGNOSIS — R29898 Other symptoms and signs involving the musculoskeletal system: Secondary | ICD-10-CM

## 2018-03-17 DIAGNOSIS — M25511 Pain in right shoulder: Secondary | ICD-10-CM

## 2018-03-17 NOTE — Therapy (Addendum)
[place], [place], [address] Phone: [phone]   Fax:  [phone]  Occupational Therapy Treatment  Patient Details  Name: Rachel Huber MRN: [id number] Date of Birth: 1956-07-25 Referring Provider: Dr. Frazier Richards   Encounter Date: 03/17/2018  OT End of Session - 03/17/18 1604    Visit Number  19    Number of Visits  24    Date for OT Re-Evaluation  04/02/18    Authorization Type  [employer] - following CPT codes allowed:  eval, self care, manual therapy, therapeutic exercise    Authorization - Visit Number  19    Authorization - Number of Visits  60    OT Start Time  5465    OT Stop Time  1557    OT Time Calculation (min)  41 min    Activity Tolerance  Patient tolerated treatment well    Behavior During Therapy  [facility] for tasks assessed/performed       Past Medical History:  Diagnosis Date  . Anxiety   . Arthritis   . Diabetes mellitus without complication ([place])   . Hepatitis    PAST  . Hypertension   . Neuropathy   . Neuropathy, diabetic ([place])   . Pneumonia   . Sinus complaint   . Sleep apnea    CPAP  . Thalassemia minor     Past Surgical History:  Procedure Laterality Date  . ABDOMINAL HYSTERECTOMY    . BREAST BIOPSY Right 02/15/2018   [place] bx 6-6:30 ribbon shape, ONE CORE FRAGMENT WITH FIBROSIS. ONE CORE FRAGMENT WITH PORTION OF A DILATED  . BREAST BIOPSY Right 02/15/2018   [place] bx 9:00 heart shape, USUAL DUCTAL HYPERPLASIA  . BREAST LUMPECTOMY Right 03/09/2018   Procedure: BREAST LUMPECTOMY x 2;  Surgeon: Benjamine Sprague, DO;  Location: [facility] ORS;  Service: General;  Laterality: Right;  . CATARACT EXTRACTION W/PHACO Left 06/11/2016   Procedure: CATARACT EXTRACTION PHACO AND INTRAOCULAR LENS PLACEMENT (IOC);  Surgeon: Estill Cotta, MD;  Location: [facility] ORS;  Service: Ophthalmology;  Laterality: Left;  Lot # [id number] H US:01:38.6 AP%:26.4 CDE:44.15  . COLONOSCOPIES    . CTR    . KNEE SURGERY  Right 09/24/2017   plates and pins  . TOTAL SHOULDER ARTHROPLASTY Right 09/27/2015    There were no vitals filed for this visit.  Subjective Assessment - 03/17/18 1519    Subjective   S: I go back to work next week.     Currently in Pain?  Yes    Pain Score  2     Pain Location  Shoulder    Pain Orientation  Right    Pain Descriptors / Indicators  Sore    Pain Type  Acute pain    Pain Radiating Towards  N/A    Pain Onset  More than a month ago    Pain Frequency  Intermittent    Aggravating Factors   certain movements    Pain Relieving Factors  rest, heat, pain medication    Effect of Pain on Daily Activities  minimal effect on ADLs    Multiple Pain Sites  No         [facility] OT Assessment - 03/17/18 1518      Assessment   Medical Diagnosis  S/P Right Total Shoulder Replacement      Precautions   Precautions  None               OT Treatments/Exercises (OP) - 03/17/18  1519      Exercises   Exercises  Shoulder      Shoulder Exercises: Supine   Protraction  PROM;5 reps    Horizontal ABduction  PROM;5 reps    External Rotation  PROM;5 reps    Internal Rotation  PROM;5 reps    Flexion  PROM;5 reps    ABduction  PROM;5 reps      Shoulder Exercises: Seated   Protraction  Theraband;12 reps    Theraband Level (Shoulder Protraction)  Level 2 (Red)    Horizontal ABduction  Theraband;12 reps    Theraband Level (Shoulder Horizontal ABduction)  Level 2 (Red)    External Rotation  Theraband;12 reps    Theraband Level (Shoulder External Rotation)  Level 2 (Red)    Internal Rotation  Theraband;12 reps    Theraband Level (Shoulder Internal Rotation)  Level 2 (Red)    Flexion  Theraband;12 reps    Theraband Level (Shoulder Flexion)  Level 2 (Red)    Abduction  Theraband;12 reps    Theraband Level (Shoulder ABduction)  Level 2 (Red)      Shoulder Exercises: Sidelying   External Rotation  Strengthening;10 reps    External Rotation Weight (lbs)  2    Internal Rotation   Strengthening;10 reps    Internal Rotation Weight (lbs)  2    Flexion  Strengthening;10 reps    Flexion Weight (lbs)  2    ABduction  Strengthening;10 reps    ABduction Weight (lbs)  2    Other Sidelying Exercises  protraction; strengthening; 10X; 2#    Other Sidelying Exercises  horizontal abduction; strengthening; 10X; 2#      Shoulder Exercises: Therapy Ball   Other Therapy Ball Exercises  diagonals, 10X      Shoulder Exercises: ROM/Strengthening   X to V Arms  12X; 1#    Other ROM/Strengthening Exercises  red loop band: lateral wall walks, diagonal wall walks, 10X each      Manual Therapy   Manual Therapy  Myofascial release    Manual therapy comments  completed seperately from all other skilled interventions    Myofascial Release  myofascial release and manual stretching to right upper arm, scapular, shoulder region  and scar region to decrease fascial restrictions and improve pain free mobility in right arm and shoulder               OT Short Term Goals - 03/11/18 1146      OT SHORT TERM GOAL #1   Title  Patient will be educated on HEP for improved RUE functional use.    Time  4    Period  Weeks    Status  On-going      OT SHORT TERM GOAL #2   Title  Patient will improve right shoulder P/ROM to [facility] in order to improve ease of donning and doffing clothing.     Time  4    Period  Weeks      OT SHORT TERM GOAL #3   Title  Patient will increase right shoulder strength to 4/5 in order to improve ability to lift bags of groceries.     Time  4    Period  Weeks    Status  Partially Met      OT SHORT TERM GOAL #4   Title  Patient will decrease fascial and scar restrictions from moderate to min moderate for improved mobility needed for increased participation in work tasks.     Time  4    Period  Weeks      OT SHORT TERM GOAL #5   Title  Patient will decrease pain to 3/10 in right arm and shoulder during ADL completion.     Time  4    Period  Weeks        OT  Long Term Goals - 03/11/18 1146      OT LONG TERM GOAL #1   Title  Patient will use her right arm as dominant with all B/IADLs, work, leisure, and driving tasks.     Time  8    Period  Weeks    Status  On-going      OT LONG TERM GOAL #2   Title  Patient will increase right shoulder A/ROM to [facility] in order to reach behind back to fasten bra and reach overhead with out difficulty.     Time  8    Period  Weeks      OT LONG TERM GOAL #3   Title  Patient will increase right shoulder strength to 5/5 in order to lift items at work and when completing leisure activities.     Time  8    Period  Weeks    Status  On-going      OT LONG TERM GOAL #4   Title  Patient will decrease pain to 2/10 or less in her right shoulder when reaching overhead.    Time  8    Period  Weeks      OT LONG TERM GOAL #5   Title  Patient will decrese fascial restrictions to minimal in order to improve moblity needed to complete daily activities without pain.     Time  8    Period  Weeks    Status  On-going            Plan - 03/17/18 1555    Clinical Impression Statement  A: Continued with manualt therapy to address fascial restrictions along anterior upper arm. Continued with strengthening in sidelying and increased weight to 2#, and added red theraband in sitting. Also added diagonals with therapy ball and resumed loop theraband. Verbal cuing for form and technique during exercises.     Plan  P: manual therapy to anterior shoulder prn. Continue with sidelying and standing strengthening, attempt body blade       Patient will benefit from skilled therapeutic intervention in order to improve the following deficits and impairments:  Pain, Decreased scar mobility, Increased fascial restrictions, Increased muscle spasms, Decreased strength, Decreased range of motion, Impaired UE functional use  Visit Diagnosis: Other symptoms and signs involving the musculoskeletal system  Acute pain of right  shoulder  Stiffness of right shoulder, not elsewhere classified    Problem List There are no active problems to display for this patient.  Guadelupe Sabin, OTR/L  [phone] 03/17/2018, 4:04 PM  [facility] [address] [place], [place], [address] Phone: [phone]   Fax:  [phone]  Name: JERALD VILLALONA MRN: [id number] Date of Birth: 1955/12/22

## 2018-03-23 ENCOUNTER — Encounter (HOSPITAL_COMMUNITY): Payer: Self-pay | Admitting: Occupational Therapy

## 2018-03-23 ENCOUNTER — Ambulatory Visit (HOSPITAL_COMMUNITY): Payer: 59 | Attending: Internal Medicine | Admitting: Occupational Therapy

## 2018-03-23 DIAGNOSIS — M25611 Stiffness of right shoulder, not elsewhere classified: Secondary | ICD-10-CM | POA: Diagnosis present

## 2018-03-23 DIAGNOSIS — M25511 Pain in right shoulder: Secondary | ICD-10-CM | POA: Insufficient documentation

## 2018-03-23 DIAGNOSIS — R29898 Other symptoms and signs involving the musculoskeletal system: Secondary | ICD-10-CM | POA: Diagnosis not present

## 2018-03-23 NOTE — Therapy (Addendum)
[place], [place], [address] Phone: [phone]   Fax:  [phone]  Occupational Therapy Treatment  Patient Details  Name: Rachel Huber MRN: [id number] Date of Birth: 1956-07-08 Referring Provider: Dr. Frazier Richards   Encounter Date: 03/23/2018  OT End of Session - 03/23/18 1742    Visit Number  20    Number of Visits  24    Date for OT Re-Evaluation  04/02/18    Authorization Type  [employer] - following CPT codes allowed:  eval, self care, manual therapy, therapeutic exercise    Authorization - Visit Number  20    Authorization - Number of Visits  60    OT Start Time  3716    OT Stop Time  1515    OT Time Calculation (min)  38 min    Activity Tolerance  Patient tolerated treatment well    Behavior During Therapy  [facility] for tasks assessed/performed       Past Medical History:  Diagnosis Date  . Anxiety   . Arthritis   . Diabetes mellitus without complication ([place])   . Hepatitis    PAST  . Hypertension   . Neuropathy   . Neuropathy, diabetic ([place])   . Pneumonia   . Sinus complaint   . Sleep apnea    CPAP  . Thalassemia minor     Past Surgical History:  Procedure Laterality Date  . ABDOMINAL HYSTERECTOMY    . BREAST BIOPSY Right 02/15/2018   [place] bx 6-6:30 ribbon shape, ONE CORE FRAGMENT WITH FIBROSIS. ONE CORE FRAGMENT WITH PORTION OF A DILATED  . BREAST BIOPSY Right 02/15/2018   [place] bx 9:00 heart shape, USUAL DUCTAL HYPERPLASIA  . BREAST LUMPECTOMY Right 03/09/2018   Procedure: BREAST LUMPECTOMY x 2;  Surgeon: Benjamine Sprague, DO;  Location: [facility] ORS;  Service: General;  Laterality: Right;  . CATARACT EXTRACTION W/PHACO Left 06/11/2016   Procedure: CATARACT EXTRACTION PHACO AND INTRAOCULAR LENS PLACEMENT (IOC);  Surgeon: Estill Cotta, MD;  Location: [facility] ORS;  Service: Ophthalmology;  Laterality: Left;  Lot # [id number] H US:01:38.6 AP%:26.4 CDE:44.15  . COLONOSCOPIES    . CTR    . KNEE SURGERY  Right 09/24/2017   plates and pins  . TOTAL SHOULDER ARTHROPLASTY Right 09/27/2015    There were no vitals filed for this visit.  Subjective Assessment - 03/23/18 1431    Subjective   S: I haven't been doing any exercises.     Currently in Pain?  Yes    Pain Score  2     Pain Location  Shoulder    Pain Descriptors / Indicators  Aching;Sore    Pain Type  Acute pain    Pain Radiating Towards  N/A    Pain Onset  More than a month ago    Pain Frequency  Intermittent    Aggravating Factors   certain movements    Pain Relieving Factors  rest, heat, pain medication    Effect of Pain on Daily Activities  minimal effect on ADLs    Multiple Pain Sites  No         [facility] OT Assessment - 03/23/18 1430      Assessment   Medical Diagnosis  S/P Right Total Shoulder Replacement      Precautions   Precautions  None               OT Treatments/Exercises (OP) - 03/23/18 1438      Exercises  Exercises  Shoulder      Shoulder Exercises: Supine   Protraction  PROM;5 reps    Horizontal ABduction  PROM;5 reps    External Rotation  PROM;5 reps    Internal Rotation  PROM;5 reps    Flexion  PROM;5 reps    ABduction  PROM;5 reps      Shoulder Exercises: Seated   Protraction  Strengthening;10 reps    Protraction Weight (lbs)  2    Horizontal ABduction  Strengthening;10 reps    Horizontal ABduction Weight (lbs)  2    External Rotation  Strengthening;10 reps    External Rotation Weight (lbs)  2    Flexion  Strengthening;10 reps    Flexion Weight (lbs)  2    Abduction  Strengthening;10 reps    ABduction Weight (lbs)  2      Shoulder Exercises: Sidelying   External Rotation  Strengthening;12 reps    External Rotation Weight (lbs)  2    Internal Rotation  Strengthening;12 reps    Internal Rotation Weight (lbs)  2    Flexion  Strengthening;12 reps    Flexion Weight (lbs)  2    ABduction  Strengthening;12 reps    ABduction Weight (lbs)  2    Other Sidelying Exercises   protraction; strengthening; 12X; 2#    Other Sidelying Exercises  horizontal abduction; strengthening; 12X; 2#      Shoulder Exercises: Standing   Other Standing Exercises  Patient passed tennis ball around head 15X to work on external rotation.    Other Standing Exercises  Pt passed tennis ball around back 15X working on IR      Shoulder Exercises: Therapy Ball   Other Therapy Ball Exercises  diagonals, circles each direction, horizontal abduction10X      Shoulder Exercises: ROM/Strengthening   Other ROM/Strengthening Exercises  red loop band: lateral wall walks, diagonal wall walks, 10X each      Shoulder Exercises: Body Blade   Flexion  5 reps;Other (comment) on last rep holding 10"    ABduction  5 reps;Other (comment) on last rep holding 10"               OT Short Term Goals - 03/11/18 1146      OT SHORT TERM GOAL #1   Title  Patient will be educated on HEP for improved RUE functional use.    Time  4    Period  Weeks    Status  On-going      OT SHORT TERM GOAL #2   Title  Patient will improve right shoulder P/ROM to [facility] in order to improve ease of donning and doffing clothing.     Time  4    Period  Weeks      OT SHORT TERM GOAL #3   Title  Patient will increase right shoulder strength to 4/5 in order to improve ability to lift bags of groceries.     Time  4    Period  Weeks    Status  Partially Met      OT SHORT TERM GOAL #4   Title  Patient will decrease fascial and scar restrictions from moderate to min moderate for improved mobility needed for increased participation in work tasks.     Time  4    Period  Weeks      OT SHORT TERM GOAL #5   Title  Patient will decrease pain to 3/10 in right arm and shoulder during ADL completion.  Time  4    Period  Weeks        OT Long Term Goals - 03/11/18 1146      OT LONG TERM GOAL #1   Title  Patient will use her right arm as dominant with all B/IADLs, work, leisure, and driving tasks.     Time  8     Period  Weeks    Status  On-going      OT LONG TERM GOAL #2   Title  Patient will increase right shoulder A/ROM to [facility] in order to reach behind back to fasten bra and reach overhead with out difficulty.     Time  8    Period  Weeks      OT LONG TERM GOAL #3   Title  Patient will increase right shoulder strength to 5/5 in order to lift items at work and when completing leisure activities.     Time  8    Period  Weeks    Status  On-going      OT LONG TERM GOAL #4   Title  Patient will decrease pain to 2/10 or less in her right shoulder when reaching overhead.    Time  8    Period  Weeks      OT LONG TERM GOAL #5   Title  Patient will decrese fascial restrictions to minimal in order to improve moblity needed to complete daily activities without pain.     Time  8    Period  Weeks    Status  On-going            Plan - 03/23/18 1742    Clinical Impression Statement  A: Continued with strengthening exercises to prepare pt for returning to work. Added body blade exercises this session for improved stability and activity tolerance. Also increased seated strengthening to 2# and added therapy ball circles and horizontal abduction. Verbal cuing for form and technique during exercises.     Plan  P: Manual therapy to anterior shoulder prn. Add theraband to sidelying exercises, add bodycraft press       Patient will benefit from skilled therapeutic intervention in order to improve the following deficits and impairments:  Pain, Decreased scar mobility, Increased fascial restrictions, Increased muscle spasms, Decreased strength, Decreased range of motion, Impaired UE functional use  Visit Diagnosis: Other symptoms and signs involving the musculoskeletal system  Acute pain of right shoulder  Stiffness of right shoulder, not elsewhere classified    Problem List There are no active problems to display for this patient.  Guadelupe Sabin, OTR/L  [phone] 03/23/2018, 5:45 PM  [facility] [address] [place], [place], [address] Phone: [phone]   Fax:  [phone]  Name: Rachel Huber MRN: [id number] Date of Birth: Jan 11, 1956

## 2018-03-25 ENCOUNTER — Encounter (HOSPITAL_COMMUNITY): Payer: 59 | Admitting: Occupational Therapy

## 2018-03-30 ENCOUNTER — Encounter (HOSPITAL_COMMUNITY): Payer: Self-pay | Admitting: Occupational Therapy

## 2018-03-30 ENCOUNTER — Ambulatory Visit (HOSPITAL_COMMUNITY): Payer: 59 | Admitting: Occupational Therapy

## 2018-03-30 DIAGNOSIS — M25511 Pain in right shoulder: Secondary | ICD-10-CM

## 2018-03-30 DIAGNOSIS — M25611 Stiffness of right shoulder, not elsewhere classified: Secondary | ICD-10-CM

## 2018-03-30 DIAGNOSIS — R29898 Other symptoms and signs involving the musculoskeletal system: Secondary | ICD-10-CM

## 2018-03-30 NOTE — Therapy (Addendum)
[place], [place], [address] Phone: [phone]   Fax:  [phone]  Occupational Therapy Treatment  Patient Details  Name: Rachel Huber MRN: [id number] Date of Birth: 12-19-1955 Referring Provider: Dr. Frazier Richards   Encounter Date: 03/30/2018  OT End of Session - 03/30/18 1620    Visit Number  21    Number of Visits  24    Date for OT Re-Evaluation  04/02/18    Authorization Type  [employer] - following CPT codes allowed:  eval, self care, manual therapy, therapeutic exercise    Authorization - Visit Number  21    Authorization - Number of Visits  60    OT Start Time  4917   pt arrived at 1345, was in restroom until 1355   OT Stop Time  1430    OT Time Calculation (min)  35 min    Activity Tolerance  Patient tolerated treatment well    Behavior During Therapy  [facility] for tasks assessed/performed       Past Medical History:  Diagnosis Date  . Anxiety   . Arthritis   . Diabetes mellitus without complication ([place])   . Hepatitis    PAST  . Hypertension   . Neuropathy   . Neuropathy, diabetic ([place])   . Pneumonia   . Sinus complaint   . Sleep apnea    CPAP  . Thalassemia minor     Past Surgical History:  Procedure Laterality Date  . ABDOMINAL HYSTERECTOMY    . BREAST BIOPSY Right 02/15/2018   [place] bx 6-6:30 ribbon shape, ONE CORE FRAGMENT WITH FIBROSIS. ONE CORE FRAGMENT WITH PORTION OF A DILATED  . BREAST BIOPSY Right 02/15/2018   [place] bx 9:00 heart shape, USUAL DUCTAL HYPERPLASIA  . BREAST LUMPECTOMY Right 03/09/2018   Procedure: BREAST LUMPECTOMY x 2;  Surgeon: Benjamine Sprague, DO;  Location: [facility] ORS;  Service: General;  Laterality: Right;  . CATARACT EXTRACTION W/PHACO Left 06/11/2016   Procedure: CATARACT EXTRACTION PHACO AND INTRAOCULAR LENS PLACEMENT (IOC);  Surgeon: Estill Cotta, MD;  Location: [facility] ORS;  Service: Ophthalmology;  Laterality: Left;  Lot #  [id number] H US:01:38.6 AP%:26.4 CDE:44.15  . COLONOSCOPIES    . CTR    . KNEE SURGERY Right 09/24/2017   plates and pins  . TOTAL SHOULDER ARTHROPLASTY Right 09/27/2015    There were no vitals filed for this visit.  Subjective Assessment - 03/30/18 1358    Subjective   S: I went back to work.     Currently in Pain?  No/denies                   OT Treatments/Exercises (OP) - 03/30/18 1359      Exercises   Exercises  Shoulder      Shoulder Exercises: Supine   Protraction  PROM;5 reps    Horizontal ABduction  PROM;5 reps    External Rotation  PROM;5 reps    Internal Rotation  PROM;5 reps    Flexion  PROM;5 reps    ABduction  PROM;5 reps      Shoulder Exercises: Sidelying   External Rotation  Theraband;10 reps    Theraband Level (Shoulder External Rotation)  Level 2 (Red)    Internal Rotation  Theraband;10 reps    Theraband Level (Shoulder Internal Rotation)  Level 2 (Red)    Flexion  Theraband;10 reps    Theraband Level (Shoulder Flexion)  Level 2 (Red)    ABduction  Theraband;10 reps  Theraband Level (Shoulder ABduction)  Level 2 (Red)    Other Sidelying Exercises  protraction; red theraband; 12X    Other Sidelying Exercises  horizontal abduction; red theraband; 12X      Shoulder Exercises: ROM/Strengthening   Other ROM/Strengthening Exercises  red loop band: lateral wall walks, diagonal wall walks, 15X each    Other ROM/Strengthening Exercises  bodycraft: 1# press, 1# chest fly, 2# row, 2# diagonals      Shoulder Exercises: Body Blade   Flexion  5 reps;Other (comment)   on last rep holding for 10"   ABduction  5 reps;Other (comment)   on the last rep holding 10"              OT Short Term Goals - 03/11/18 1146      OT [place] #1   Title  Patient will be educated on HEP for improved RUE functional use.    Time  4    Period  Weeks    Status  On-going      OT SHORT TERM GOAL #2   Title  Patient will improve right shoulder P/ROM  to [facility] in order to improve ease of donning and doffing clothing.     Time  4    Period  Weeks      OT SHORT TERM GOAL #3   Title  Patient will increase right shoulder strength to 4/5 in order to improve ability to lift bags of groceries.     Time  4    Period  Weeks    Status  Partially Met      OT SHORT TERM GOAL #4   Title  Patient will decrease fascial and scar restrictions from moderate to min moderate for improved mobility needed for increased participation in work tasks.     Time  4    Period  Weeks      OT SHORT TERM GOAL #5   Title  Patient will decrease pain to 3/10 in right arm and shoulder during ADL completion.     Time  4    Period  Weeks        OT Long Term Goals - 03/11/18 1146      OT LONG TERM GOAL #1   Title  Patient will use her right arm as dominant with all B/IADLs, work, leisure, and driving tasks.     Time  8    Period  Weeks    Status  On-going      OT LONG TERM GOAL #2   Title  Patient will increase right shoulder A/ROM to [facility] in order to reach behind back to fasten bra and reach overhead with out difficulty.     Time  8    Period  Weeks      OT LONG TERM GOAL #3   Title  Patient will increase right shoulder strength to 5/5 in order to lift items at work and when completing leisure activities.     Time  8    Period  Weeks    Status  On-going      OT LONG TERM GOAL #4   Title  Patient will decrease pain to 2/10 or less in her right shoulder when reaching overhead.    Time  8    Period  Weeks      OT LONG TERM GOAL #5   Title  Patient will decrese fascial restrictions to minimal in order to improve moblity needed to complete daily  activities without pain.     Time  8    Period  Weeks    Status  On-going            Plan - 03/30/18 1621    Clinical Impression Statement  A: Continued with strengthening exercises today, added red theraband in sidelying and bodycraft exercises. Pt able to complete with occasional rest breaks and verbal  cuing for form and technique. Discussed progress with pt and preparation for reassessment and discharge next session. Pt plans to attend gym once discharged.     Plan  P: Reassessment, FOTO, discharge with updated HEP       Patient will benefit from skilled therapeutic intervention in order to improve the following deficits and impairments:  Pain, Decreased scar mobility, Increased fascial restrictions, Increased muscle spasms, Decreased strength, Decreased range of motion, Impaired UE functional use  Visit Diagnosis: Other symptoms and signs involving the musculoskeletal system  Acute pain of right shoulder  Stiffness of right shoulder, not elsewhere classified    Problem List There are no active problems to display for this patient.  Guadelupe Sabin, OTR/L  [phone] 03/30/2018, 4:23 PM  [facility] [address] [place], [place], [address] Phone: [phone]   Fax:  [phone]  Name: Rachel Huber MRN: [id number] Date of Birth: 1956/06/11

## 2018-04-01 ENCOUNTER — Encounter (HOSPITAL_COMMUNITY): Payer: Self-pay

## 2018-04-01 ENCOUNTER — Ambulatory Visit (HOSPITAL_COMMUNITY): Payer: 59

## 2018-04-01 DIAGNOSIS — M25511 Pain in right shoulder: Secondary | ICD-10-CM

## 2018-04-01 DIAGNOSIS — R29898 Other symptoms and signs involving the musculoskeletal system: Secondary | ICD-10-CM

## 2018-04-01 DIAGNOSIS — M25611 Stiffness of right shoulder, not elsewhere classified: Secondary | ICD-10-CM

## 2018-04-01 NOTE — Therapy (Signed)
[place], [place], [address] Phone: [phone]   Fax:  [phone]  Occupational Therapy Treatment  Patient Details  Name: Rachel Huber MRN: [id number] Date of Birth: [birth year] Referring Provider: Dr. Frazier Richards   Encounter Date: 04/01/2018  OT End of Session - 04/01/18 1145    Visit Number  22    Number of Visits  24    Authorization Type  [employer] - following CPT codes allowed:  eval, self care, manual therapy, therapeutic exercise    Authorization - Visit Number  22    Authorization - Number of Visits  60    OT Start Time  5277   reassess and discharge   OT Stop Time  1112    OT Time Calculation (min)  39 min    Activity Tolerance  Patient tolerated treatment well    Behavior During Therapy  [facility] for tasks assessed/performed       Past Medical History:  Diagnosis Date  . Anxiety   . Arthritis   . Diabetes mellitus without complication ([place])   . Hepatitis    PAST  . Hypertension   . Neuropathy   . Neuropathy, diabetic ([place])   . Pneumonia   . Sinus complaint   . Sleep apnea    CPAP  . Thalassemia minor     Past Surgical History:  Procedure Laterality Date  . ABDOMINAL HYSTERECTOMY    . BREAST BIOPSY Right 02/15/2018   [place] bx 6-6:30 ribbon shape, ONE CORE FRAGMENT WITH FIBROSIS. ONE CORE FRAGMENT WITH PORTION OF A DILATED  . BREAST BIOPSY Right 02/15/2018   [place] bx 9:00 heart shape, USUAL DUCTAL HYPERPLASIA  . BREAST LUMPECTOMY Right 03/09/2018   Procedure: BREAST LUMPECTOMY x 2;  Surgeon: Benjamine Sprague, DO;  Location: [facility] ORS;  Service: General;  Laterality: Right;  . CATARACT EXTRACTION W/PHACO Left 06/11/2016   Procedure: CATARACT EXTRACTION PHACO AND INTRAOCULAR LENS PLACEMENT (IOC);  Surgeon: Estill Cotta, MD;  Location: [facility] ORS;  Service: Ophthalmology;  Laterality: Left;  Lot # [id number] H US:01:38.6 AP%:26.4 CDE:44.15  . COLONOSCOPIES    . CTR    . KNEE SURGERY Right  09/24/2017   plates and pins  . TOTAL SHOULDER ARTHROPLASTY Right 09/27/2015    There were no vitals filed for this visit.  Subjective Assessment - 04/01/18 1142    Subjective   S: The pain in my muscle versus my shoulder is what stops me from using this arm.     Special Tests  FOTO: 65/100    Currently in Pain?  No/denies         [facility] OT Assessment - 04/01/18 1036      Assessment   Medical Diagnosis  S/P Right Total Shoulder Replacement    Referring Provider  Dr. Frazier Richards      Precautions   Precautions  None      ROM / Strength   AROM / PROM / Strength  AROM;PROM;Strength      AROM   Overall AROM Comments  assessed in seated, external and internal rotation with shoulder adducted     AROM Assessment Site  Shoulder    Right/Left Shoulder  Right    Right Shoulder Flexion  135 Degrees   previous: same   Right Shoulder ABduction  135 Degrees   previous: same   Right Shoulder Internal Rotation  90 Degrees   previous: same   Right Shoulder External Rotation  58 Degrees   previous:  46     PROM   Overall PROM Comments  assessed in supine, external rotation and internal rotation with shoulder adducted    PROM Assessment Site  Shoulder    Right/Left Shoulder  Right    Right Shoulder Flexion  150 Degrees   previous: 152   Right Shoulder ABduction  168 Degrees   previous: same   Right Shoulder Internal Rotation  90 Degrees   previous: same   Right Shoulder External Rotation  55 Degrees   previous: 75     Strength   Overall Strength Comments  assessed in seated position, external and internal rotation with shoulder adducted     Strength Assessment Site  Shoulder    Right/Left Shoulder  Right    Right Shoulder Flexion  4+/5   previous: same   Right Shoulder ABduction  4+/5   previous: 4/5   Right Shoulder Internal Rotation  3+/5   previous: 4/5   Right Shoulder External Rotation  3+/5   previous: same              OT Treatments/Exercises (OP) -  04/01/18 1143      Exercises   Exercises  Shoulder      Shoulder Exercises: Supine   Protraction  PROM;5 reps    Horizontal ABduction  PROM;5 reps    External Rotation  PROM;5 reps    Internal Rotation  PROM;5 reps    Flexion  PROM;5 reps    ABduction  PROM;5 reps             OT Education - 04/01/18 1143    Education provided  Yes    Education Details  Reviewed progress in therapy. Discussed which machines or exercises to complete when attending [employer]. Reviewed goals. HEP was reviewed with recommendations on which exercises to continue or stop.     Person(s) Educated  Patient    Methods  Explanation    Comprehension  Verbalized understanding       OT Short Term Goals - 04/01/18 1053      OT SHORT TERM GOAL #1   Title  Patient will be educated on HEP for improved RUE functional use.    Time  4    Period  Weeks    Status  Achieved      OT SHORT TERM GOAL #2   Title  Patient will improve right shoulder P/ROM to [facility] in order to improve ease of donning and doffing clothing.     Time  4    Period  Weeks      OT SHORT TERM GOAL #3   Title  Patient will increase right shoulder strength to 4/5 in order to improve ability to lift bags of groceries.     Baseline  8/15: 90% met. Did not achieve with IR/er.    Time  4    Period  Weeks    Status  Partially Met      OT SHORT TERM GOAL #4   Title  Patient will decrease fascial and scar restrictions from moderate to min moderate for improved mobility needed for increased participation in work tasks.     Time  4    Period  Weeks      OT SHORT TERM GOAL #5   Title  Patient will decrease pain to 3/10 in right arm and shoulder during ADL completion.     Time  4    Period  Weeks  OT Long Term Goals - 04/01/18 1055      OT LONG TERM GOAL #1   Title  Patient will use her right arm as dominant with all B/IADLs, work, leisure, and driving tasks.     Baseline  8/15: Using RUE as dominant for 80-90%.    Time  8     Period  Weeks    Status  Partially Met      OT LONG TERM GOAL #2   Title  Patient will increase right shoulder A/ROM to [facility] in order to reach behind back to fasten bra and reach overhead with out difficulty.     Time  8    Period  Weeks      OT LONG TERM GOAL #3   Title  Patient will increase right shoulder strength to 5/5 in order to lift items at work and when completing leisure activities.     Time  8    Period  Weeks    Status  Not Met      OT LONG TERM GOAL #4   Title  Patient will decrease pain to 2/10 or less in her right shoulder when reaching overhead.    Time  8    Period  Weeks      OT LONG TERM GOAL #5   Title  Patient will decrese fascial restrictions to minimal in order to improve moblity needed to complete daily activities without pain.     Time  8    Period  Weeks    Status  Achieved            Plan - 04/01/18 1146    Clinical Impression Statement  A: reassessment completed this date. patient's Active and Passive ROM has remained the same with the exception of external rotation in which patient did increase A/ROM. Patient has met 4/5 STGs with the 5th goal partially met related to strength. 3/5 LTGs have been met.  Pt reports that she attempts to reach and use her RUE for all daily tasks first before using her left UE if she needs to. She continues to experience muscle pain in her bicep/tricep region of her right arm during certain tasks. She is able to lift heavy items with her Right arm extended down by her side versus bending her elbow to carry which she is unable to do. HEP was reviewed this session. Discharge recommended as patient has reached her highest level of independence with therapy and is independent with HEP to continue to focus on deficits.     Plan  P: D/C with HEP.    Consulted and Agree with Plan of Care  Patient       Patient will benefit from skilled therapeutic intervention in order to improve the following deficits and impairments:   Pain, Decreased scar mobility, Increased fascial restrictions, Increased muscle spasms, Decreased strength, Decreased range of motion, Impaired UE functional use  Visit Diagnosis: Other symptoms and signs involving the musculoskeletal system  Acute pain of right shoulder  Stiffness of right shoulder, not elsewhere classified    Problem List There are no active problems to display for this patient.   OCCUPATIONAL THERAPY DISCHARGE SUMMARY  Visits from Start of Care: 22  Current functional level related to goals / functional outcomes: See above   Remaining deficits: See above   Education / Equipment: See above Plan: Patient agrees to discharge.  Patient goals were partially met. Patient is being discharged due to meeting the stated rehab goals.  ?????  Ailene Ravel, OTR/L,CBIS  [phone]  04/01/2018, 12:28 PM  [place] [address] [place], [place], [address] Phone: [phone]   Fax:  [phone]  Name: JANIYLAH HANNIS MRN: [id number] Date of Birth: 1956-08-13

## 2018-04-08 NOTE — Addendum Note (Signed)
Addended by: Guadelupe Sabin A on: 04/08/2018 07:30 AM   Modules accepted: Orders

## 2018-04-14 ENCOUNTER — Encounter (HOSPITAL_COMMUNITY): Payer: 59 | Admitting: Occupational Therapy

## 2018-06-03 ENCOUNTER — Encounter: Payer: Self-pay | Admitting: *Deleted

## 2018-06-15 ENCOUNTER — Ambulatory Visit: Payer: Managed Care, Other (non HMO) | Admitting: Anesthesiology

## 2018-06-15 ENCOUNTER — Encounter
Admission: RE | Disposition: A | Discharge status: Home / Self Care | Payer: Self-pay | Source: Ambulatory Visit | Attending: Ophthalmology

## 2018-06-15 ENCOUNTER — Encounter: Payer: Self-pay | Admitting: *Deleted

## 2018-06-15 ENCOUNTER — Other Ambulatory Visit: Payer: Self-pay

## 2018-06-15 ENCOUNTER — Ambulatory Visit
Admission: RE | Admit: 2018-06-15 | Discharge: 2018-06-15 | Disposition: A | Discharge status: Home / Self Care | Payer: Managed Care, Other (non HMO) | Source: Ambulatory Visit | Attending: Ophthalmology | Admitting: Ophthalmology

## 2018-06-15 DIAGNOSIS — I10 Essential (primary) hypertension: Secondary | ICD-10-CM | POA: Insufficient documentation

## 2018-06-15 DIAGNOSIS — G4730 Sleep apnea, unspecified: Secondary | ICD-10-CM | POA: Insufficient documentation

## 2018-06-15 DIAGNOSIS — H2511 Age-related nuclear cataract, right eye: Secondary | ICD-10-CM | POA: Diagnosis not present

## 2018-06-15 DIAGNOSIS — E7800 Pure hypercholesterolemia, unspecified: Secondary | ICD-10-CM | POA: Diagnosis not present

## 2018-06-15 DIAGNOSIS — Z79899 Other long term (current) drug therapy: Secondary | ICD-10-CM | POA: Insufficient documentation

## 2018-06-15 DIAGNOSIS — Z7984 Long term (current) use of oral hypoglycemic drugs: Secondary | ICD-10-CM | POA: Diagnosis not present

## 2018-06-15 DIAGNOSIS — E1140 Type 2 diabetes mellitus with diabetic neuropathy, unspecified: Secondary | ICD-10-CM | POA: Insufficient documentation

## 2018-06-15 DIAGNOSIS — Z9989 Dependence on other enabling machines and devices: Secondary | ICD-10-CM | POA: Insufficient documentation

## 2018-06-15 HISTORY — DX: Gastro-esophageal reflux disease without esophagitis: K21.9

## 2018-06-15 HISTORY — PX: CATARACT EXTRACTION W/PHACO: SHX586

## 2018-06-15 HISTORY — DX: Dyspnea, unspecified: R06.00

## 2018-06-15 LAB — GLUCOSE, CAPILLARY: Glucose-Capillary: 299 mg/dL — ABNORMAL HIGH (ref 70–99)

## 2018-06-15 SURGERY — PHACOEMULSIFICATION, CATARACT, WITH IOL INSERTION
Anesthesia: Monitor Anesthesia Care | Site: Eye | Laterality: Right

## 2018-06-15 MED ORDER — SODIUM CHLORIDE 0.9 % IV SOLN
INTRAVENOUS | Status: DC
  Administered 2018-06-15: 07:00:00 via INTRAVENOUS

## 2018-06-15 MED ORDER — NA CHONDROIT SULF-NA HYALURON 40-17 MG/ML IO SOLN
INTRAOCULAR | Status: AC
  Filled 2018-06-15: qty 1

## 2018-06-15 MED ORDER — TETRACAINE HCL 0.5 % OP SOLN
1.0000 [drp] | OPHTHALMIC | Status: DC | PRN
  Administered 2018-06-15 (×2): 1 [drp] via OPHTHALMIC

## 2018-06-15 MED ORDER — POVIDONE-IODINE 5 % OP SOLN
OPHTHALMIC | Status: AC
  Filled 2018-06-15: qty 30

## 2018-06-15 MED ORDER — TETRACAINE HCL 0.5 % OP SOLN
OPHTHALMIC | Status: AC
  Administered 2018-06-15: 1 [drp] via OPHTHALMIC
  Filled 2018-06-15: qty 4

## 2018-06-15 MED ORDER — ARMC OPHTHALMIC DILATING DROPS
1.0000 "application " | OPHTHALMIC | Status: AC
  Administered 2018-06-15 (×3): 1 via OPHTHALMIC

## 2018-06-15 MED ORDER — MIDAZOLAM HCL 2 MG/2ML IJ SOLN
INTRAMUSCULAR | Status: AC
  Filled 2018-06-15: qty 2

## 2018-06-15 MED ORDER — MOXIFLOXACIN HCL 0.5 % OP SOLN
OPHTHALMIC | Status: DC | PRN
  Administered 2018-06-15: 0.2 mL via OPHTHALMIC

## 2018-06-15 MED ORDER — POVIDONE-IODINE 5 % OP SOLN
OPHTHALMIC | Status: DC | PRN
  Administered 2018-06-15: 1 via OPHTHALMIC

## 2018-06-15 MED ORDER — MIDAZOLAM HCL 2 MG/2ML IJ SOLN
INTRAMUSCULAR | Status: DC | PRN
  Administered 2018-06-15 (×2): 1 mg via INTRAVENOUS

## 2018-06-15 MED ORDER — NA CHONDROIT SULF-NA HYALURON 40-17 MG/ML IO SOLN
INTRAOCULAR | Status: DC | PRN
  Administered 2018-06-15: 1 mL via INTRAOCULAR

## 2018-06-15 MED ORDER — LIDOCAINE HCL (PF) 4 % IJ SOLN
INTRAMUSCULAR | Status: AC
  Filled 2018-06-15: qty 5

## 2018-06-15 MED ORDER — LIDOCAINE HCL (PF) 4 % IJ SOLN
INTRAOCULAR | Status: DC | PRN
  Administered 2018-06-15: 4 mL via OPHTHALMIC

## 2018-06-15 MED ORDER — MOXIFLOXACIN HCL 0.5 % OP SOLN: 1.0000 [drp] | OPHTHALMIC | Status: DC | PRN

## 2018-06-15 MED ORDER — EPINEPHRINE PF 1 MG/ML IJ SOLN
INTRAMUSCULAR | Status: AC
  Filled 2018-06-15: qty 2

## 2018-06-15 MED ORDER — CARBACHOL 0.01 % IO SOLN
INTRAOCULAR | Status: DC | PRN
  Administered 2018-06-15: 0.5 mL via INTRAOCULAR

## 2018-06-15 MED ORDER — EPINEPHRINE PF 1 MG/ML IJ SOLN
INTRAOCULAR | Status: DC | PRN
  Administered 2018-06-15: 07:00:00 via OPHTHALMIC

## 2018-06-15 MED ORDER — FENTANYL CITRATE (PF) 100 MCG/2ML IJ SOLN
INTRAMUSCULAR | Status: DC | PRN
  Administered 2018-06-15: 25 ug via INTRAVENOUS
  Administered 2018-06-15: 50 ug via INTRAVENOUS
  Administered 2018-06-15: 25 ug via INTRAVENOUS

## 2018-06-15 MED ORDER — MOXIFLOXACIN HCL 0.5 % OP SOLN
OPHTHALMIC | Status: AC
  Filled 2018-06-15: qty 3

## 2018-06-15 MED ORDER — FENTANYL CITRATE (PF) 100 MCG/2ML IJ SOLN
INTRAMUSCULAR | Status: AC
  Filled 2018-06-15: qty 2

## 2018-06-15 MED ORDER — ARMC OPHTHALMIC DILATING DROPS
OPHTHALMIC | Status: AC
  Administered 2018-06-15: 1 via OPHTHALMIC
  Filled 2018-06-15: qty 0.5

## 2018-06-15 SURGICAL SUPPLY — 17 items
GLOVE BIO SURGEON STRL SZ8 (GLOVE) ×2 IMPLANT
GLOVE BIOGEL M 6.5 STRL (GLOVE) ×2 IMPLANT
GLOVE SURG LX 8.0 MICRO (GLOVE) ×1
GLOVE SURG LX STRL 8.0 MICRO (GLOVE) ×1 IMPLANT
GOWN STRL REUS W/ TWL LRG LVL3 (GOWN DISPOSABLE) ×2 IMPLANT
GOWN STRL REUS W/TWL LRG LVL3 (GOWN DISPOSABLE) ×2
LABEL CATARACT MEDS ST (LABEL) ×2 IMPLANT
LENS IOL ACRSF IQ ULTRA 17.0 (Intraocular Lens) ×1 IMPLANT
LENS IOL ACRYSOF IQ 17.0 (Intraocular Lens) ×2 IMPLANT
PACK CATARACT (MISCELLANEOUS) ×2 IMPLANT
PACK CATARACT BRASINGTON LX (MISCELLANEOUS) ×2 IMPLANT
PACK EYE AFTER SURG (MISCELLANEOUS) ×2 IMPLANT
SOL BSS BAG (MISCELLANEOUS) ×2
SOLUTION BSS BAG (MISCELLANEOUS) ×1 IMPLANT
SYR 5ML LL (SYRINGE) ×2 IMPLANT
WATER STERILE IRR 250ML POUR (IV SOLUTION) ×2 IMPLANT
WIPE NON LINTING 3.25X3.25 (MISCELLANEOUS) ×2 IMPLANT

## 2018-06-15 NOTE — H&P (Signed)
All labs reviewed. Abnormal studies sent to patients PCP when indicated.  Previous H&P reviewed, patient examined, there are NO CHANGES.  Rachel Raczka Porfilio10/29/20197:15 AM

## 2018-06-15 NOTE — Discharge Instructions (Signed)
Eye Surgery Discharge Instructions    Expect mild scratchy sensation or mild soreness. DO NOT RUB YOUR EYE!  The day of surgery:  Minimal physical activity, but bed rest is not required  No reading, computer work, or close hand work  No bending, lifting, or straining.  May watch TV  For 24 hours:  No driving, legal decisions, or alcoholic beverages  Safety precautions  Eat anything you prefer: It is better to start with liquids, then soup then solid foods.  _____ Eye patch should be worn until postoperative exam tomorrow.  ____ Solar shield eyeglasses should be worn for comfort in the sunlight/patch while sleeping  Resume all regular medications including aspirin or Coumadin if these were discontinued prior to surgery. You may shower, bathe, shave, or wash your hair. Tylenol may be taken for mild discomfort.  Call your doctor if you experience significant pain, nausea, or vomiting, fever > 101 or other signs of infection. [phone] or [phone] Specific instructions:  Follow-up Information    Birder Robson, MD Follow up on 06/16/2018.   Specialty:  Ophthalmology Why:  10:50 Contact information: [address] [place] [address] [phone]

## 2018-06-15 NOTE — Anesthesia Post-op Follow-up Note (Signed)
Anesthesia QCDR form completed.        

## 2018-06-15 NOTE — Transfer of Care (Signed)
Immediate Anesthesia Transfer of Care Note  Patient: Rachel Huber  Procedure(s) Performed: CATARACT EXTRACTION PHACO AND INTRAOCULAR LENS PLACEMENT (IOC) (Right Eye)  Patient Location: PACU  Anesthesia Type:MAC  Level of Consciousness: awake, alert  and oriented  Airway & Oxygen Therapy: Patient Spontanous Breathing  Post-op Assessment: Report given to RN and Post -op Vital signs reviewed and stable  Post vital signs: Reviewed and stable  Last Vitals:  Vitals Value Taken Time  BP    Temp    Pulse    Resp    SpO2      Last Pain:  Vitals:   06/15/18 0619  TempSrc: Temporal  PainSc: 0-No pain      Patients Stated Pain Goal: 0 (00/86/76 1950)  Complications: No apparent anesthesia complications

## 2018-06-15 NOTE — Op Note (Signed)
PREOPERATIVE DIAGNOSIS:  Nuclear sclerotic cataract of the right eye.   POSTOPERATIVE DIAGNOSIS:  NUCLEAR SCLEROTIC CATARACT RIGHT EYE   OPERATIVE PROCEDURE: Procedure(s): CATARACT EXTRACTION PHACO AND INTRAOCULAR LENS PLACEMENT (IOC)   SURGEON:  Birder Robson, MD.   ANESTHESIA:  Anesthesiologist: Emmie Niemann, MD CRNA: Philbert Riser, CRNA  1.      Managed anesthesia care. 2.      0.64m of Shugarcaine was instilled in the eye following the paracentesis.   COMPLICATIONS:  None.   TECHNIQUE:   Stop and chop   DESCRIPTION OF PROCEDURE:  The patient was examined and consented in the preoperative holding area where the aforementioned topical anesthesia was applied to the right eye and then brought back to the Operating Room where the right eye was prepped and draped in the usual sterile ophthalmic fashion and a lid speculum was placed. A paracentesis was created with the side port blade and the anterior chamber was filled with viscoelastic. A near clear corneal incision was performed with the steel keratome. A continuous curvilinear capsulorrhexis was performed with a cystotome followed by the capsulorrhexis forceps. Hydrodissection and hydrodelineation were carried out with BSS on a blunt cannula. The lens was removed in a stop and chop  technique and the remaining cortical material was removed with the irrigation-aspiration handpiece. The capsular bag was inflated with viscoelastic and the Technis [id number]  lens was placed in the capsular bag without complication. The remaining viscoelastic was removed from the eye with the irrigation-aspiration handpiece. The wounds were hydrated. The anterior chamber was flushed with Miostat and the eye was inflated to physiologic pressure. 0.162mof Vigamox was placed in the anterior chamber. The wounds were found to be water tight. The eye was dressed with Vigamox. The patient was given protective glasses to wear throughout the day and a shield with which to  sleep tonight. The patient was also given drops with which to begin a drop regimen today and will follow-up with me in one day. Implant Name Type Inv. Item Serial No. Manufacturer Lot No. LRB No. Used  LENS IOL ACRYSOF IQ 17.0 - [id number] Intraocular Lens LENS IOL ACRYSOF IQ 17.0 [id number] ALCON  Right 1   Procedure(s) with comments: CATARACT EXTRACTION PHACO AND INTRAOCULAR LENS PLACEMENT (IOC) (Right) - [place]0:38.2 CDE 4.23 Fluid Pack Lot # [id number]  Electronically signed: WiBirder Robson0/29/2019 7:51 AM

## 2018-06-15 NOTE — Anesthesia Postprocedure Evaluation (Signed)
Anesthesia Post Note  Patient: Rachel Huber  Procedure(s) Performed: CATARACT EXTRACTION PHACO AND INTRAOCULAR LENS PLACEMENT (IOC) (Right Eye)  Patient location during evaluation: PACU Anesthesia Type: MAC Level of consciousness: awake and alert and oriented Pain management: pain level controlled Vital Signs Assessment: post-procedure vital signs reviewed and stable Respiratory status: spontaneous breathing Cardiovascular status: blood pressure returned to baseline Postop Assessment: no headache Anesthetic complications: no     Last Vitals:  Vitals:   06/15/18 0619  BP: 127/65  Pulse: 76  Resp: 20  Temp: (!) 36.3 C  SpO2: 98%    Last Pain:  Vitals:   06/15/18 0619  TempSrc: Temporal  PainSc: 0-No pain                 Philbert Riser

## 2018-06-15 NOTE — Anesthesia Preprocedure Evaluation (Signed)
Anesthesia Evaluation  Patient identified by MRN, date of birth, ID band Patient awake    Reviewed: Allergy & Precautions, NPO status , Patient's Chart, lab work & pertinent test results  History of Anesthesia Complications Negative for: history of anesthetic complications  Airway Mallampati: III  TM Distance: >3 FB Neck ROM: Full    Dental  (+) Poor Dentition   Pulmonary sleep apnea and Continuous Positive Airway Pressure Ventilation , neg COPD,    breath sounds clear to auscultation- rhonchi (-) wheezing      Cardiovascular Exercise Tolerance: Good hypertension, Pt. on medications (-) CAD, (-) Past MI, (-) Cardiac Stents and (-) CABG  Rhythm:Regular Rate:Normal - Systolic murmurs and - Diastolic murmurs    Neuro/Psych Anxiety negative neurological ROS     GI/Hepatic Neg liver ROS, GERD  ,  Endo/Other  diabetes, Oral Hypoglycemic Agents  Renal/GU negative Renal ROS     Musculoskeletal  (+) Arthritis ,   Abdominal (+) + obese,   Peds  Hematology  (+) anemia ,   Anesthesia Other Findings Past Medical History: No date: Anxiety No date: Arthritis No date: Diabetes mellitus without complication ([facility]) No date: Dyspnea     Comment:  DOE No date: GERD (gastroesophageal reflux disease) No date: Hepatitis     Comment:  PAST No date: Hypertension No date: Neuropathy No date: Neuropathy, diabetic ([place]) No date: Pneumonia No date: Sinus complaint No date: Sleep apnea     Comment:  CPAP No date: Thalassemia minor No date: Thalassemia minor   Reproductive/Obstetrics                             Anesthesia Physical Anesthesia Plan  ASA: III  Anesthesia Plan: MAC   Post-op Pain Management:    Induction: Intravenous  PONV Risk Score and Plan: 2 and Midazolam  Airway Management Planned: Natural Airway  Additional Equipment:   Intra-op Plan:   Post-operative Plan:   Informed  Consent: I have reviewed the patients History and Physical, chart, labs and discussed the procedure including the risks, benefits and alternatives for the proposed anesthesia with the patient or authorized representative who has indicated his/her understanding and acceptance.     Plan Discussed with: CRNA and Anesthesiologist  Anesthesia Plan Comments:         Anesthesia Quick Evaluation

## 2019-01-23 IMAGING — MG MM BREAST SURGICAL SPECIMEN
1 series · 1 of 1 positions shown · non-contrast
Comparison: Previous exam(s).

CLINICAL DATA: 62-year-old female for excision of the right breast.

EXAM:
SPECIMEN RADIOGRAPH OF THE RIGHT BREAST

[R SPECIMEN]
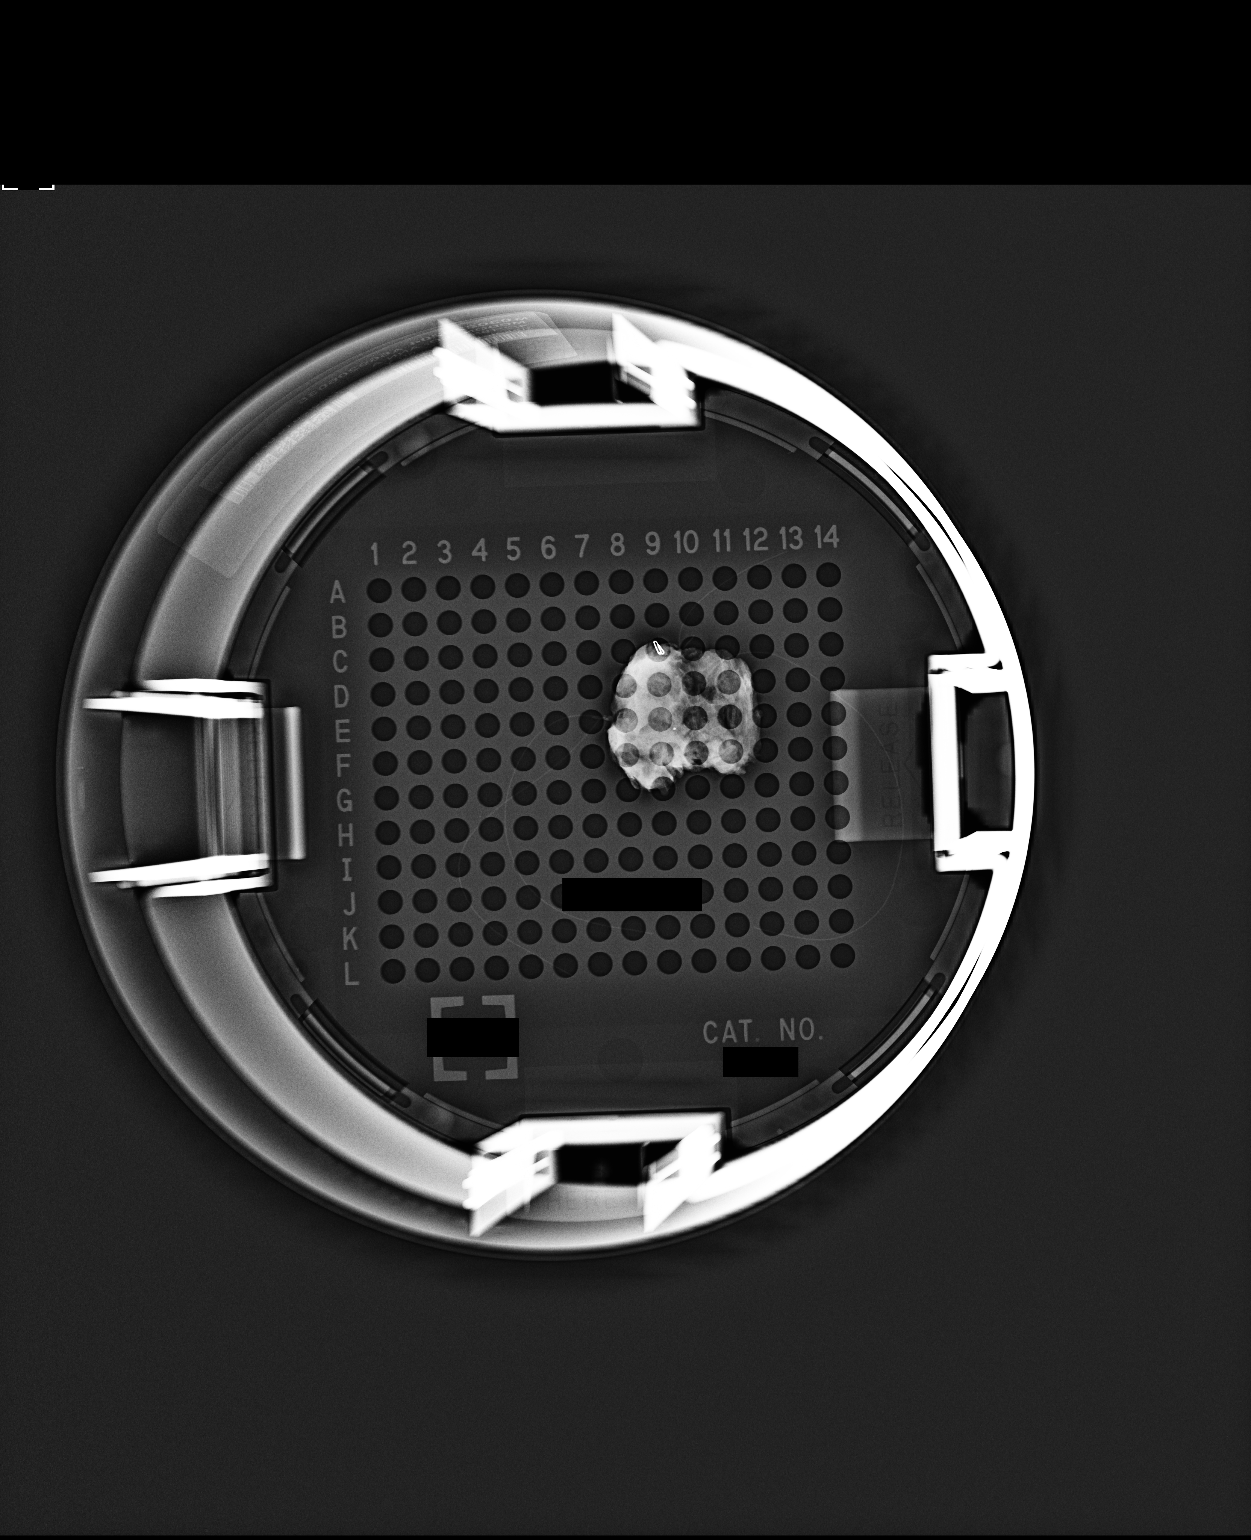

[1 of 1 positions shown; findings below may reference images not displayed]

FINDINGS: Status post excision of the right breast.

A heart shaped biopsy marker clip is present in the pathology
specimen from the 9 o'clock position.

A specimen from the 6 o'clock position did not demonstrate an
associated biopsy clip. Additional tissue was then submitted,
containing a ribbon shaped clip.
IMPRESSION: Specimen radiograph of the right breast.

## 2019-10-11 ENCOUNTER — Other Ambulatory Visit: Payer: Self-pay | Admitting: Obstetrics & Gynecology

## 2019-10-11 DIAGNOSIS — Z1231 Encounter for screening mammogram for malignant neoplasm of breast: Secondary | ICD-10-CM

## 2019-11-02 ENCOUNTER — Ambulatory Visit
Admission: RE | Admit: 2019-11-02 | Discharge: 2019-11-02 | Disposition: A | Discharge status: Home / Self Care | Payer: Managed Care, Other (non HMO) | Source: Ambulatory Visit | Attending: Obstetrics & Gynecology | Admitting: Obstetrics & Gynecology

## 2019-11-02 DIAGNOSIS — Z1231 Encounter for screening mammogram for malignant neoplasm of breast: Secondary | ICD-10-CM | POA: Insufficient documentation

## 2019-11-08 ENCOUNTER — Inpatient Hospital Stay
Admission: RE | Admit: 2019-11-08 | Discharge: 2019-11-08 | Disposition: A | Discharge status: Home / Self Care | Payer: Self-pay | Source: Ambulatory Visit | Attending: *Deleted | Admitting: *Deleted

## 2019-11-08 ENCOUNTER — Other Ambulatory Visit: Payer: Self-pay | Admitting: *Deleted

## 2019-11-08 DIAGNOSIS — Z1231 Encounter for screening mammogram for malignant neoplasm of breast: Secondary | ICD-10-CM

## 2020-07-19 ENCOUNTER — Other Ambulatory Visit: Payer: Self-pay | Admitting: Obstetrics & Gynecology

## 2020-07-19 DIAGNOSIS — N6310 Unspecified lump in the right breast, unspecified quadrant: Secondary | ICD-10-CM

## 2020-09-01 ENCOUNTER — Emergency Department: Payer: Managed Care, Other (non HMO)

## 2020-09-01 ENCOUNTER — Encounter: Payer: Self-pay | Admitting: Emergency Medicine

## 2020-09-01 DIAGNOSIS — D563 Thalassemia minor: Secondary | ICD-10-CM | POA: Diagnosis present

## 2020-09-01 DIAGNOSIS — K219 Gastro-esophageal reflux disease without esophagitis: Secondary | ICD-10-CM | POA: Diagnosis present

## 2020-09-01 DIAGNOSIS — E860 Dehydration: Secondary | ICD-10-CM | POA: Diagnosis present

## 2020-09-01 DIAGNOSIS — J9601 Acute respiratory failure with hypoxia: Secondary | ICD-10-CM | POA: Diagnosis present

## 2020-09-01 DIAGNOSIS — Z79899 Other long term (current) drug therapy: Secondary | ICD-10-CM

## 2020-09-01 DIAGNOSIS — M160 Bilateral primary osteoarthritis of hip: Secondary | ICD-10-CM | POA: Diagnosis present

## 2020-09-01 DIAGNOSIS — S72114A Nondisplaced fracture of greater trochanter of right femur, initial encounter for closed fracture: Secondary | ICD-10-CM | POA: Diagnosis not present

## 2020-09-01 DIAGNOSIS — W010XXA Fall on same level from slipping, tripping and stumbling without subsequent striking against object, initial encounter: Secondary | ICD-10-CM | POA: Diagnosis present

## 2020-09-01 DIAGNOSIS — Z91040 Latex allergy status: Secondary | ICD-10-CM

## 2020-09-01 DIAGNOSIS — Z7984 Long term (current) use of oral hypoglycemic drugs: Secondary | ICD-10-CM

## 2020-09-01 DIAGNOSIS — U071 COVID-19: Secondary | ICD-10-CM | POA: Diagnosis present

## 2020-09-01 DIAGNOSIS — E1140 Type 2 diabetes mellitus with diabetic neuropathy, unspecified: Secondary | ICD-10-CM | POA: Diagnosis present

## 2020-09-01 DIAGNOSIS — G4730 Sleep apnea, unspecified: Secondary | ICD-10-CM | POA: Diagnosis present

## 2020-09-01 DIAGNOSIS — N179 Acute kidney failure, unspecified: Secondary | ICD-10-CM | POA: Diagnosis present

## 2020-09-01 DIAGNOSIS — E785 Hyperlipidemia, unspecified: Secondary | ICD-10-CM | POA: Diagnosis present

## 2020-09-01 DIAGNOSIS — N1831 Chronic kidney disease, stage 3a: Secondary | ICD-10-CM | POA: Diagnosis present

## 2020-09-01 DIAGNOSIS — Z6837 Body mass index (BMI) 37.0-37.9, adult: Secondary | ICD-10-CM

## 2020-09-01 DIAGNOSIS — D61818 Other pancytopenia: Secondary | ICD-10-CM | POA: Diagnosis present

## 2020-09-01 DIAGNOSIS — F32A Depression, unspecified: Secondary | ICD-10-CM | POA: Diagnosis present

## 2020-09-01 DIAGNOSIS — G629 Polyneuropathy, unspecified: Secondary | ICD-10-CM | POA: Diagnosis present

## 2020-09-01 DIAGNOSIS — K921 Melena: Secondary | ICD-10-CM | POA: Diagnosis not present

## 2020-09-01 DIAGNOSIS — E1122 Type 2 diabetes mellitus with diabetic chronic kidney disease: Secondary | ICD-10-CM | POA: Diagnosis present

## 2020-09-01 DIAGNOSIS — Z90710 Acquired absence of both cervix and uterus: Secondary | ICD-10-CM

## 2020-09-01 DIAGNOSIS — S72001A Fracture of unspecified part of neck of right femur, initial encounter for closed fracture: Secondary | ICD-10-CM | POA: Diagnosis not present

## 2020-09-01 DIAGNOSIS — I959 Hypotension, unspecified: Secondary | ICD-10-CM | POA: Diagnosis not present

## 2020-09-01 DIAGNOSIS — F419 Anxiety disorder, unspecified: Secondary | ICD-10-CM | POA: Diagnosis present

## 2020-09-01 DIAGNOSIS — J1282 Pneumonia due to coronavirus disease 2019: Secondary | ICD-10-CM | POA: Diagnosis not present

## 2020-09-01 DIAGNOSIS — Z79890 Hormone replacement therapy (postmenopausal): Secondary | ICD-10-CM

## 2020-09-01 DIAGNOSIS — E1165 Type 2 diabetes mellitus with hyperglycemia: Secondary | ICD-10-CM | POA: Diagnosis present

## 2020-09-01 DIAGNOSIS — I129 Hypertensive chronic kidney disease with stage 1 through stage 4 chronic kidney disease, or unspecified chronic kidney disease: Secondary | ICD-10-CM | POA: Diagnosis present

## 2020-09-01 NOTE — ED Triage Notes (Signed)
Pt arrived via EMS post mechanical fall at local hotel. Pt reports she tripped over a bag and landed onto the right hip and right arm. Pt denies LOC or hitting head. Pt c/o bilateral hip pain and right elbow pain. No obvious deformity or shortening.

## 2020-09-01 NOTE — ED Notes (Signed)
Patient to waiting room via wheelchair by EMS after a fall.  EMS reports patient fell outside, denies hitting head, complains of right hip pain.  EMS vitals -- hr 104, bp 133 55, pulse oxi 100% on room air.

## 2020-09-02 ENCOUNTER — Other Ambulatory Visit: Payer: Self-pay

## 2020-09-02 ENCOUNTER — Inpatient Hospital Stay
Admission: EM | Admit: 2020-09-02 | Discharge: 2020-09-13 | DRG: 535 | Disposition: A | Discharge status: Home / Self Care | Payer: Managed Care, Other (non HMO) | Attending: Internal Medicine | Admitting: Internal Medicine

## 2020-09-02 ENCOUNTER — Emergency Department: Payer: Managed Care, Other (non HMO)

## 2020-09-02 DIAGNOSIS — E1169 Type 2 diabetes mellitus with other specified complication: Secondary | ICD-10-CM | POA: Diagnosis present

## 2020-09-02 DIAGNOSIS — R509 Fever, unspecified: Secondary | ICD-10-CM

## 2020-09-02 DIAGNOSIS — D61818 Other pancytopenia: Secondary | ICD-10-CM

## 2020-09-02 DIAGNOSIS — W19XXXA Unspecified fall, initial encounter: Secondary | ICD-10-CM

## 2020-09-02 DIAGNOSIS — I10 Essential (primary) hypertension: Secondary | ICD-10-CM

## 2020-09-02 DIAGNOSIS — N179 Acute kidney failure, unspecified: Secondary | ICD-10-CM | POA: Diagnosis not present

## 2020-09-02 DIAGNOSIS — U071 COVID-19: Secondary | ICD-10-CM

## 2020-09-02 DIAGNOSIS — S72114A Nondisplaced fracture of greater trochanter of right femur, initial encounter for closed fracture: Secondary | ICD-10-CM | POA: Diagnosis not present

## 2020-09-02 DIAGNOSIS — D649 Anemia, unspecified: Secondary | ICD-10-CM | POA: Diagnosis present

## 2020-09-02 DIAGNOSIS — J1282 Pneumonia due to coronavirus disease 2019: Secondary | ICD-10-CM | POA: Clinically undetermined

## 2020-09-02 DIAGNOSIS — E119 Type 2 diabetes mellitus without complications: Secondary | ICD-10-CM | POA: Diagnosis present

## 2020-09-02 DIAGNOSIS — E785 Hyperlipidemia, unspecified: Secondary | ICD-10-CM

## 2020-09-02 DIAGNOSIS — Y92009 Unspecified place in unspecified non-institutional (private) residence as the place of occurrence of the external cause: Secondary | ICD-10-CM

## 2020-09-02 DIAGNOSIS — F32A Depression, unspecified: Secondary | ICD-10-CM | POA: Diagnosis present

## 2020-09-02 DIAGNOSIS — D563 Thalassemia minor: Secondary | ICD-10-CM

## 2020-09-02 DIAGNOSIS — N1831 Chronic kidney disease, stage 3a: Secondary | ICD-10-CM

## 2020-09-02 DIAGNOSIS — E1129 Type 2 diabetes mellitus with other diabetic kidney complication: Secondary | ICD-10-CM

## 2020-09-02 DIAGNOSIS — S72001A Fracture of unspecified part of neck of right femur, initial encounter for closed fracture: Secondary | ICD-10-CM | POA: Diagnosis not present

## 2020-09-02 DIAGNOSIS — E1165 Type 2 diabetes mellitus with hyperglycemia: Secondary | ICD-10-CM | POA: Diagnosis present

## 2020-09-02 DIAGNOSIS — N189 Chronic kidney disease, unspecified: Secondary | ICD-10-CM | POA: Diagnosis present

## 2020-09-02 DIAGNOSIS — J9601 Acute respiratory failure with hypoxia: Secondary | ICD-10-CM | POA: Diagnosis not present

## 2020-09-02 LAB — CBC WITH DIFFERENTIAL/PLATELET
Abs Immature Granulocytes: 0.01 10*3/uL (ref 0.00–0.07)
Basophils Absolute: 0 10*3/uL (ref 0.0–0.1)
Basophils Relative: 1 %
Eosinophils Absolute: 0 10*3/uL (ref 0.0–0.5)
Eosinophils Relative: 0 %
HCT: 24.8 % — ABNORMAL LOW (ref 36.0–46.0)
Hemoglobin: 7.6 g/dL — ABNORMAL LOW (ref 12.0–15.0)
Immature Granulocytes: 0 %
Lymphocytes Relative: 16 %
Lymphs Abs: 0.6 10*3/uL — ABNORMAL LOW (ref 0.7–4.0)
MCH: 19.9 pg — ABNORMAL LOW (ref 26.0–34.0)
MCHC: 30.6 g/dL (ref 30.0–36.0)
MCV: 65.1 fL — ABNORMAL LOW (ref 80.0–100.0)
Monocytes Absolute: 0.4 10*3/uL (ref 0.1–1.0)
Monocytes Relative: 11 %
Neutro Abs: 2.8 10*3/uL (ref 1.7–7.7)
Neutrophils Relative %: 72 %
Platelets: 99 10*3/uL — ABNORMAL LOW (ref 150–400)
RBC: 3.81 MIL/uL — ABNORMAL LOW (ref 3.87–5.11)
RDW: 14.6 % (ref 11.5–15.5)
Smear Review: NORMAL
WBC: 3.8 10*3/uL — ABNORMAL LOW (ref 4.0–10.5)
nRBC: 0 % (ref 0.0–0.2)

## 2020-09-02 LAB — BASIC METABOLIC PANEL
Anion gap: 13 (ref 5–15)
BUN: 27 mg/dL — ABNORMAL HIGH (ref 8–23)
CO2: 21 mmol/L — ABNORMAL LOW (ref 22–32)
Calcium: 9.3 mg/dL (ref 8.9–10.3)
Chloride: 101 mmol/L (ref 98–111)
Creatinine, Ser: 1.33 mg/dL — ABNORMAL HIGH (ref 0.44–1.00)
GFR, Estimated: 45 mL/min — ABNORMAL LOW (ref >60)
Glucose, Bld: 265 mg/dL — ABNORMAL HIGH (ref 70–99)
Potassium: 4.3 mmol/L (ref 3.5–5.1)
Sodium: 135 mmol/L (ref 135–145)

## 2020-09-02 LAB — SARS CORONAVIRUS 2 (TAT 6-24 HRS): SARS Coronavirus 2: POSITIVE — AB

## 2020-09-02 LAB — CBG MONITORING, ED
Glucose-Capillary: 194 mg/dL — ABNORMAL HIGH (ref 70–99)
Glucose-Capillary: 134 mg/dL — ABNORMAL HIGH (ref 70–99)
Glucose-Capillary: 155 mg/dL — ABNORMAL HIGH (ref 70–99)

## 2020-09-02 MED ORDER — ONDANSETRON HCL 4 MG/2ML IJ SOLN: 4.0000 mg | Freq: Three times a day (TID) | INTRAMUSCULAR | Status: DC | PRN

## 2020-09-02 MED ORDER — ACETAMINOPHEN 325 MG PO TABS
650.0000 mg | ORAL_TABLET | Freq: Four times a day (QID) | ORAL | Status: DC | PRN
  Administered 2020-09-09: 21:00:00 650 mg via ORAL
  Filled 2020-09-02 (×3): qty 2

## 2020-09-02 MED ORDER — HYDROMORPHONE HCL 1 MG/ML IJ SOLN
1.0000 mg | INTRAMUSCULAR | Status: DC | PRN
  Administered 2020-09-02 – 2020-09-03 (×2): 1 mg via INTRAVENOUS
  Filled 2020-09-02 (×4): qty 1

## 2020-09-02 MED ORDER — HYDRALAZINE HCL 20 MG/ML IJ SOLN: 5.0000 mg | INTRAMUSCULAR | Status: DC | PRN

## 2020-09-02 MED ORDER — DIPHENHYDRAMINE HCL 25 MG PO TABS
25.0000 mg | ORAL_TABLET | Freq: Every day | ORAL | Status: DC | PRN
  Filled 2020-09-02: qty 1

## 2020-09-02 MED ORDER — OXYCODONE-ACETAMINOPHEN 5-325 MG PO TABS
1.0000 | ORAL_TABLET | ORAL | Status: DC | PRN
  Administered 2020-09-03 – 2020-09-13 (×22): 1 via ORAL
  Filled 2020-09-02 (×23): qty 1

## 2020-09-02 MED ORDER — INSULIN ASPART 100 UNIT/ML ~~LOC~~ SOLN
0.0000 [IU] | Freq: Three times a day (TID) | SUBCUTANEOUS | Status: DC
  Administered 2020-09-03: 1 [IU] via SUBCUTANEOUS
  Administered 2020-09-03: 2 [IU] via SUBCUTANEOUS
  Administered 2020-09-04: 1 [IU] via SUBCUTANEOUS
  Administered 2020-09-04: 5 [IU] via SUBCUTANEOUS
  Administered 2020-09-04: 2 [IU] via SUBCUTANEOUS
  Administered 2020-09-05: 17:00:00 3 [IU] via SUBCUTANEOUS
  Administered 2020-09-05: 13:00:00 5 [IU] via SUBCUTANEOUS
  Administered 2020-09-05: 09:00:00 2 [IU] via SUBCUTANEOUS
  Administered 2020-09-06: 4 [IU] via SUBCUTANEOUS
  Administered 2020-09-06: 1 [IU] via SUBCUTANEOUS
  Administered 2020-09-06: 3 [IU] via SUBCUTANEOUS
  Administered 2020-09-07: 08:00:00 2 [IU] via SUBCUTANEOUS
  Administered 2020-09-07: 5 [IU] via SUBCUTANEOUS
  Administered 2020-09-07: 3 [IU] via SUBCUTANEOUS
  Administered 2020-09-08: 2 [IU] via SUBCUTANEOUS
  Administered 2020-09-08: 18:00:00 3 [IU] via SUBCUTANEOUS
  Administered 2020-09-08: 10:00:00 2 [IU] via SUBCUTANEOUS
  Administered 2020-09-09 (×2): 3 [IU] via SUBCUTANEOUS
  Administered 2020-09-09: 09:00:00 2 [IU] via SUBCUTANEOUS
  Administered 2020-09-10: 18:00:00 7 [IU] via SUBCUTANEOUS
  Administered 2020-09-10: 2 [IU] via SUBCUTANEOUS
  Administered 2020-09-10: 14:00:00 5 [IU] via SUBCUTANEOUS
  Administered 2020-09-11: 9 [IU] via SUBCUTANEOUS
  Filled 2020-09-02 (×22): qty 1

## 2020-09-02 MED ORDER — KETOROLAC TROMETHAMINE 30 MG/ML IJ SOLN
30.0000 mg | Freq: Once | INTRAMUSCULAR | Status: AC
  Administered 2020-09-02: 30 mg via INTRAVENOUS
  Filled 2020-09-02: qty 1

## 2020-09-02 MED ORDER — SODIUM CHLORIDE 0.9 % IV SOLN: INTRAVENOUS | Status: DC

## 2020-09-02 MED ORDER — GABAPENTIN 400 MG PO CAPS
800.0000 mg | ORAL_CAPSULE | Freq: Two times a day (BID) | ORAL | Status: DC
  Administered 2020-09-02 – 2020-09-13 (×22): 800 mg via ORAL
  Filled 2020-09-02: qty 8
  Filled 2020-09-02: qty 2
  Filled 2020-09-02: qty 8
  Filled 2020-09-02 (×4): qty 2
  Filled 2020-09-02: qty 8
  Filled 2020-09-02 (×4): qty 2
  Filled 2020-09-02 (×3): qty 8
  Filled 2020-09-02: qty 2
  Filled 2020-09-02 (×2): qty 8
  Filled 2020-09-02 (×3): qty 2
  Filled 2020-09-02: qty 8
  Filled 2020-09-02 (×10): qty 2
  Filled 2020-09-02: qty 8

## 2020-09-02 MED ORDER — INSULIN ASPART 100 UNIT/ML ~~LOC~~ SOLN
0.0000 [IU] | Freq: Every day | SUBCUTANEOUS | Status: DC
  Administered 2020-09-05 – 2020-09-08 (×2): 2 [IU] via SUBCUTANEOUS
  Administered 2020-09-10: 4 [IU] via SUBCUTANEOUS
  Filled 2020-09-02 (×4): qty 1

## 2020-09-02 MED ORDER — HYDROMORPHONE HCL 1 MG/ML IJ SOLN
1.0000 mg | Freq: Once | INTRAMUSCULAR | Status: AC
Start: 2020-09-02 — End: 2020-09-02
  Administered 2020-09-02: 1 mg via INTRAMUSCULAR
  Filled 2020-09-02: qty 1

## 2020-09-02 MED ORDER — AMLODIPINE BESYLATE 5 MG PO TABS
5.0000 mg | ORAL_TABLET | Freq: Every day | ORAL | Status: DC
  Administered 2020-09-02 – 2020-09-07 (×4): 5 mg via ORAL
  Filled 2020-09-02 (×6): qty 1

## 2020-09-02 MED ORDER — SENNOSIDES-DOCUSATE SODIUM 8.6-50 MG PO TABS: 1.0000 | ORAL_TABLET | Freq: Every evening | ORAL | Status: DC | PRN

## 2020-09-02 MED ORDER — ATORVASTATIN CALCIUM 20 MG PO TABS
40.0000 mg | ORAL_TABLET | Freq: Every day | ORAL | Status: DC
  Administered 2020-09-02 – 2020-09-13 (×12): 40 mg via ORAL
  Filled 2020-09-02 (×12): qty 2

## 2020-09-02 MED ORDER — GABAPENTIN 300 MG PO CAPS
800.0000 mg | ORAL_CAPSULE | Freq: Two times a day (BID) | ORAL | Status: DC
  Filled 2020-09-02: qty 2

## 2020-09-02 MED ORDER — SERTRALINE HCL 50 MG PO TABS
50.0000 mg | ORAL_TABLET | Freq: Every day | ORAL | Status: DC
  Administered 2020-09-02 – 2020-09-13 (×12): 50 mg via ORAL
  Filled 2020-09-02 (×12): qty 1

## 2020-09-02 MED ORDER — METHOCARBAMOL 500 MG PO TABS
500.0000 mg | ORAL_TABLET | Freq: Three times a day (TID) | ORAL | Status: DC | PRN
  Filled 2020-09-02: qty 1

## 2020-09-02 MED ORDER — HYDROMORPHONE HCL 1 MG/ML IJ SOLN
1.0000 mg | Freq: Once | INTRAMUSCULAR | Status: AC
  Administered 2020-09-02: 1 mg via INTRAVENOUS
  Filled 2020-09-02: qty 1

## 2020-09-02 NOTE — ED Notes (Signed)
This RN attempted to ambulated patient, patient able to stand but unable to ambulate again. Ward DO made aware.
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
## 2020-09-02 NOTE — Consult Note (Signed)
ORTHOPAEDIC CONSULTATION  REQUESTING PHYSICIAN: Ivor Costa, MD  Chief Complaint: Right greater trochanter fracture s/p fall  HPI:    Full consult note to follow   Briefly, Rachel Huber is a 65 y.o. female who Dr. Leonides Schanz, the ER attending, had called me on this morning.  Patient sustained a fall and has a nondisplaced fracture of the hip of the right greater trochanter.  This is a nonoperative case.  I recommend physical therapy for evaluation.  Patient is weightbearing as tolerated with trochanter precautions.  She may be discharged home if her pain is controlled otherwise she may be admitted for skilled nursing facility placement.    Past Medical History:  Diagnosis Date  . Anxiety   . Arthritis   . Diabetes mellitus without complication ([place])   . Dyspnea    DOE  . GERD (gastroesophageal reflux disease)   . Hepatitis    PAST  . Hypertension   . Neuropathy   . Neuropathy, diabetic ([place])   . Pneumonia   . Sinus complaint   . Sleep apnea    CPAP  . Thalassemia minor   . Thalassemia minor    Past Surgical History:  Procedure Laterality Date  . ABDOMINAL HYSTERECTOMY    . BREAST BIOPSY Right 02/15/2018   [place] bx 6-6:30 ribbon shape, ONE CORE FRAGMENT WITH FIBROSIS. ONE CORE FRAGMENT WITH PORTION OF A DILATED  . BREAST BIOPSY Right 02/15/2018   [place] bx 9:00 heart shape, USUAL DUCTAL HYPERPLASIA  . BREAST LUMPECTOMY Right 03/09/2018   Procedure: BREAST LUMPECTOMY x 2;  Surgeon: Benjamine Sprague, DO;  Location: [facility] ORS;  Service: General;  Laterality: Right;  . CATARACT EXTRACTION W/PHACO Left 06/11/2016   Procedure: CATARACT EXTRACTION PHACO AND INTRAOCULAR LENS PLACEMENT (IOC);  Surgeon: Estill Cotta, MD;  Location: [facility] ORS;  Service: Ophthalmology;  Laterality: Left;  Lot # [id number] H US:01:38.6 AP%:26.4 CDE:44.15  . CATARACT EXTRACTION W/PHACO Right 06/15/2018   Procedure: CATARACT EXTRACTION PHACO AND INTRAOCULAR LENS PLACEMENT (IOC);  Surgeon: Birder Robson, MD;   Location: [facility] ORS;  Service: Ophthalmology;  Laterality: Right;  [place] 00:38.2 CDE 4.23 Fluid Pack Lot # [id number]  . COLONOSCOPIES    . CTR    . JOINT REPLACEMENT    . KNEE SURGERY Right 09/24/2017   plates and pins  . TOTAL SHOULDER ARTHROPLASTY Right 09/27/2015   Social History   Socioeconomic History  . Marital status: Married    Spouse name: Not on file  . Number of children: Not on file  . Years of education: Not on file  . Highest education level: Not on file  Occupational History  . Not on file  Tobacco Use  . Smoking status: Never Smoker  . Smokeless tobacco: Never Used  Vaping Use  . Vaping Use: Never used  Substance and Sexual Activity  . Alcohol use: No  . Drug use: Never  . Sexual activity: Not on file  Other Topics Concern  . Not on file  Social History Narrative  . Not on file   Social Determinants of Health   Financial Resource Strain: Not on file  Food Insecurity: Not on file  Transportation Needs: Not on file  Physical Activity: Not on file  Stress: Not on file  Social Connections: Not on file   Family History  Problem Relation Age of Onset  . Breast cancer Mother 70  . Breast cancer Sister 27   Allergies  Allergen Reactions  . Latex Rash    Contact rash  Prior to Admission medications   Medication Sig Start Date End Date Taking? Authorizing Provider  acetaminophen (TYLENOL) 650 MG CR tablet Take 1,300 mg by mouth daily as needed for pain.   Yes [provider]  atorvastatin (LIPITOR) 40 MG tablet Take 40 mg by mouth daily. 12/12/17  Yes [provider]  diphenhydrAMINE (BENADRYL) 25 MG tablet Take 25 mg by mouth daily as needed for allergies.   Yes [provider]  gabapentin (NEURONTIN) 400 MG capsule Take 800 mg by mouth 2 (two) times daily. 01/07/18  Yes [provider]  glimepiride (AMARYL) 2 MG tablet Take 2 mg by mouth daily. 01/04/18  Yes [provider]  lisinopril-hydrochlorothiazide  (PRINZIDE,ZESTORETIC) 20-25 MG tablet Take 1 tablet by mouth daily. 01/07/18  Yes [provider]  metFORMIN (GLUCOPHAGE-XR) 500 MG 24 hr tablet Take 1,000 mg by mouth 2 (two) times daily. 02/02/18  Yes [provider]  sertraline (ZOLOFT) 50 MG tablet Take 50 mg by mouth daily. 12/12/17  Yes [provider]  TRULICITY 4.5 DJ/5.7SV SOPN Inject 4.5 mg into the skin once a week. 07/13/20  Yes [provider]   DG Elbow Complete Right  Result Date: 09/01/2020 CLINICAL DATA:  Pain status post mechanical fall. EXAM: RIGHT ELBOW - COMPLETE 3+ VIEW COMPARISON:  09/21/2017 FINDINGS: The patient is status post prior ORIF of the right humerus. The distal aspect of the hardware is visualized. There is some lucency about the distal aspect of the hardware which may represent loosening. There is no acute displaced fracture. No significant joint effusion. IMPRESSION: 1. No acute osseous abnormality. 2. Status post prior ORIF of the right humerus. There is some lucency about the distal aspect of the hardware which may represent loosening. Electronically Signed   By: Constance Holster M.D.   On: 09/01/2020 22:19   CT Hip Right Wo Contrast  Result Date: 09/02/2020 CLINICAL DATA:  Hip trauma. EXAM: CT OF THE RIGHT HIP WITHOUT CONTRAST TECHNIQUE: Multidetector CT imaging of the right hip was performed according to the standard protocol. Multiplanar CT image reconstructions were also generated. COMPARISON:  None. FINDINGS: Bones/Joint/Cartilage There is an acute, nondisplaced fracture through the greater trochanter of the right hip. There is no dislocation. Ligaments Suboptimally assessed by CT. Muscles and Tendons There is no acute intramuscular hematoma. Soft tissues The patient is status post prior hysterectomy. There is soft tissue swelling about the hip. IMPRESSION: Acute, nondisplaced fracture through the greater trochanter of the right hip. Electronically Signed   By: Constance Holster M.D.   On: 09/02/2020 03:54   DG Hip Unilat W or Wo Pelvis 2-3 Views Left  Result Date: 09/01/2020 CLINICAL DATA:  Pain status post fall. EXAM: DG HIP (WITH OR WITHOUT PELVIS) 2-3V LEFT; DG HIP (WITH OR WITHOUT PELVIS) 2-3V RIGHT COMPARISON:  None. FINDINGS: There is no acute displaced fracture or dislocation. Mild degenerative changes are noted of both hips. There are degenerative changes of the lower lumbar spine, especially at the L4-L5 and L5-S1 levels. IMPRESSION: 1. No acute displaced fracture or dislocation. 2. Mild degenerative changes of both hips. Electronically Signed   By: Constance Holster M.D.   On: 09/01/2020 22:08   DG Hip Unilat W or Wo Pelvis 2-3 Views Right  Result Date: 09/01/2020 CLINICAL DATA:  Pain status post fall. EXAM: DG HIP (WITH OR WITHOUT PELVIS) 2-3V LEFT; DG HIP (WITH OR WITHOUT PELVIS) 2-3V RIGHT COMPARISON:  None. FINDINGS: There is no acute displaced fracture or dislocation. Mild degenerative  changes are noted of both hips. There are degenerative changes of the lower lumbar spine, especially at the L4-L5 and L5-S1 levels. IMPRESSION: 1. No acute displaced fracture or dislocation. 2. Mild degenerative changes of both hips. Electronically Signed   By: Constance Holster M.D.   On: 09/01/2020 22:08    Positive ROS: All other systems have been reviewed and were otherwise negative with the exception of those mentioned in the HPI and as above.     Thornton Park, MD    09/02/2020 10:52 AM
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
## 2020-09-02 NOTE — ED Notes (Signed)
This RN attempted to ambulate patient. Pt able to stand with assistance but unable to ambulate. Ward DO made aware.
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
## 2020-09-03 DIAGNOSIS — D61818 Other pancytopenia: Secondary | ICD-10-CM | POA: Diagnosis not present

## 2020-09-03 DIAGNOSIS — N179 Acute kidney failure, unspecified: Secondary | ICD-10-CM | POA: Diagnosis not present

## 2020-09-03 DIAGNOSIS — N1831 Chronic kidney disease, stage 3a: Secondary | ICD-10-CM | POA: Diagnosis not present

## 2020-09-03 DIAGNOSIS — S72001A Fracture of unspecified part of neck of right femur, initial encounter for closed fracture: Secondary | ICD-10-CM | POA: Diagnosis not present

## 2020-09-03 LAB — CBC
HCT: 23.8 % — ABNORMAL LOW (ref 36.0–46.0)
Hemoglobin: 7.3 g/dL — ABNORMAL LOW (ref 12.0–15.0)
MCH: 19.9 pg — ABNORMAL LOW (ref 26.0–34.0)
MCHC: 30.7 g/dL (ref 30.0–36.0)
MCV: 65 fL — ABNORMAL LOW (ref 80.0–100.0)
Platelets: 88 10*3/uL — ABNORMAL LOW (ref 150–400)
RBC: 3.66 MIL/uL — ABNORMAL LOW (ref 3.87–5.11)
RDW: 14.6 % (ref 11.5–15.5)
WBC: 2.9 10*3/uL — ABNORMAL LOW (ref 4.0–10.5)
nRBC: 0 % (ref 0.0–0.2)

## 2020-09-03 LAB — BASIC METABOLIC PANEL
Anion gap: 10 (ref 5–15)
BUN: 26 mg/dL — ABNORMAL HIGH (ref 8–23)
CO2: 24 mmol/L (ref 22–32)
Calcium: 8.7 mg/dL — ABNORMAL LOW (ref 8.9–10.3)
Chloride: 103 mmol/L (ref 98–111)
Creatinine, Ser: 1.17 mg/dL — ABNORMAL HIGH (ref 0.44–1.00)
GFR, Estimated: 53 mL/min — ABNORMAL LOW (ref >60)
Glucose, Bld: 173 mg/dL — ABNORMAL HIGH (ref 70–99)
Potassium: 4.1 mmol/L (ref 3.5–5.1)
Sodium: 137 mmol/L (ref 135–145)

## 2020-09-03 LAB — CBG MONITORING, ED
Glucose-Capillary: 157 mg/dL — ABNORMAL HIGH (ref 70–99)
Glucose-Capillary: 137 mg/dL — ABNORMAL HIGH (ref 70–99)
Glucose-Capillary: 127 mg/dL — ABNORMAL HIGH (ref 70–99)
Glucose-Capillary: 128 mg/dL — ABNORMAL HIGH (ref 70–99)

## 2020-09-03 LAB — HEMOGLOBIN A1C
Hgb A1c MFr Bld: 6.9 % — ABNORMAL HIGH (ref 4.8–5.6)
Mean Plasma Glucose: 151.33 mg/dL

## 2020-09-03 LAB — HIV ANTIBODY (ROUTINE TESTING W REFLEX): HIV Screen 4th Generation wRfx: NONREACTIVE

## 2020-09-03 NOTE — ED Notes (Signed)
cbg 157

## 2020-09-03 NOTE — Progress Notes (Signed)
PROGRESS NOTE    SYNA GAD  BHA:[id number] DOB: 1955/12/13 DOA: 09/02/2020 PCP: [name], MD   Assessment & Plan:   Principal Problem:   Closed right hip fracture [facility]) Active Problems:   Type II diabetes mellitus with renal manifestations ([place])   Hypertension   Thalassemia minor   Pancytopenia ([place])   HLD (hyperlipidemia)   Acute renal failure superimposed on stage 3a chronic kidney disease ([place])   Depression   Fall at home, initial encounter   Nondisplaced fracture of greater trochanter of right femur, initial encounter for closed fracture ([place])    Closed right hip fracture: nondisplaced fracture of greater trochanter of right femur. No surgery needed as per ortho. Weightbearing as tolerated. Dilaudid, percocet prn for pain. PT recs SNF but pt wants the day to think this over   DM2: likely poorly controlled. Hold home dose of oral anti-hyperglycemics. Continue on SSI w/ accuchecks   HTN: continue on amlodipine. Continue to hold home dose of prinzide secondary to AKI. IV hydralazine prn   Thalassemia minor & pancytopenia: no need for transfusions currently. Will transfuse pRBCs if Hb <7.0  HLD: continue on statin   AKI on CKDIIIa: baseline Cr 0.88. Likely secondary to dehydration. Continue on IVFs  Depression: severity unknown. Continue on home dose of zoloft   Fall: at home. PT recs SNF    DVT prophylaxis: SCDs Code Status: full  Family Communication:  Disposition Plan: d/c to SNF vs HH   Status is: Observation  The patient remains OBS appropriate and will d/c before 2 midnights.  Dispo: The patient is from: Home              Anticipated d/c is to: SNF vs [facility]              Anticipated d/c date is: 3 days              Patient currently is not medically stable to d/c.     Consultants:   Ortho surg    Procedures:    Antimicrobials:    Subjective: Pt c/o right hip pain   Objective: Vitals:   09/03/20 0400 09/03/20 0500 09/03/20  0600 09/03/20 0630  BP: (!) 117/51 (!) 102/52 (!) 125/53 122/60  Pulse: 89 87 86 87  Resp: 16 15 16 15   Temp: 99.1 F (37.3 C)     TempSrc: Oral     SpO2: 92% 90% 91% 93%  Weight:      Height:       No intake or output data in the 24 hours ending 09/03/20 0756 Filed Weights   09/02/20 1517  Weight: 119.3 kg    Examination:  General exam: Appears calm and comfortable. Morbidly obese Respiratory system: diminished breath sounds b/l otherwise clear  Cardiovascular system: S1 & S2 +. No rubs, gallops or clicks.  Gastrointestinal system: Abdomen is obese, soft and nontender.Normal bowel sounds heard. Central nervous system: Alert and oriented. Moves all 4 extremities  Psychiatry: Judgement and insight appear normal. Flat mood and affect    Data Reviewed: I have personally reviewed following labs and imaging studies  CBC: Recent Labs  Lab 09/02/20 0459 09/03/20 0441  WBC 3.8* 2.9*  NEUTROABS 2.8  --   HGB 7.6* 7.3*  HCT 24.8* 23.8*  MCV 65.1* 65.0*  PLT 99* 88*   Basic Metabolic Panel: Recent Labs  Lab 09/02/20 0459 09/03/20 0441  NA 135 137  K 4.3 4.1  CL 101 103  CO2 21*  24  GLUCOSE 265* 173*  BUN 27* 26*  CREATININE 1.33* 1.17*  CALCIUM 9.3 8.7*   GFR: Estimated Creatinine Clearance: 69.9 mL/min (A) (by C-G formula based on SCr of 1.17 mg/dL (H)). Liver Function Tests: No results for input(s): AST, ALT, ALKPHOS, BILITOT, PROT, ALBUMIN in the last 168 hours. No results for input(s): LIPASE, AMYLASE in the last 168 hours. No results for input(s): AMMONIA in the last 168 hours. Coagulation Profile: No results for input(s): INR, PROTIME in the last 168 hours. Cardiac Enzymes: No results for input(s): CKTOTAL, CKMB, CKMBINDEX, TROPONINI in the last 168 hours. BNP (last 3 results) No results for input(s): PROBNP in the last 8760 hours. HbA1C: No results for input(s): HGBA1C in the last 72 hours. CBG: Recent Labs  Lab 09/02/20 1201 09/02/20 1649  09/02/20 2129  GLUCAP 194* 134* 155*   Lipid Profile: No results for input(s): CHOL, HDL, LDLCALC, TRIG, CHOLHDL, LDLDIRECT in the last 72 hours. Thyroid Function Tests: No results for input(s): TSH, T4TOTAL, FREET4, T3FREE, THYROIDAB in the last 72 hours. Anemia Panel: No results for input(s): VITAMINB12, FOLATE, FERRITIN, TIBC, IRON, RETICCTPCT in the last 72 hours. Sepsis Labs: No results for input(s): PROCALCITON, LATICACIDVEN in the last 168 hours.  Recent Results (from the past 240 hour(s))  SARS CORONAVIRUS 2 (TAT 6-24 HRS) Nasopharyngeal Nasopharyngeal Swab     Status: Abnormal   Collection Time: 09/02/20  5:02 AM   Specimen: Nasopharyngeal Swab  Result Value Ref Range Status   SARS Coronavirus 2 POSITIVE (A) NEGATIVE Final    Comment: (NOTE) SARS-CoV-2 target nucleic acids are DETECTED.  The SARS-CoV-2 RNA is generally detectable in upper and lower respiratory specimens during the acute phase of infection. Positive results are indicative of the presence of SARS-CoV-2 RNA. Clinical correlation with patient history and other diagnostic information is  necessary to determine patient infection status. Positive results do not rule out bacterial infection or co-infection with other viruses.  The expected result is Negative.  Fact Sheet for Patients: SugarRoll.be  Fact Sheet for Healthcare Providers: https://www.woods-mathews.com/  This test is not yet approved or cleared by the [place] FDA and  has been authorized for detection and/or diagnosis of SARS-CoV-2 by FDA under an Emergency Use Authorization (EUA). This EUA will remain  in effect (meaning this test can be used) for the duration of the COVID-19 declaration under Section 564(b)(1) of the Act, 21 U. S.C. section 360bbb-3(b)(1), unless the authorization is terminated or revoked sooner.   Performed at [facility], [place] [address], [place], [place] [address]           Radiology Studies: DG Elbow Complete Right  Result Date: 09/01/2020 CLINICAL DATA:  Pain status post mechanical fall. EXAM: RIGHT ELBOW - COMPLETE 3+ VIEW COMPARISON:  09/21/2017 FINDINGS: The patient is status post prior ORIF of the right humerus. The distal aspect of the hardware is visualized. There is some lucency about the distal aspect of the hardware which may represent loosening. There is no acute displaced fracture. No significant joint effusion. IMPRESSION: 1. No acute osseous abnormality. 2. Status post prior ORIF of the right humerus. There is some lucency about the distal aspect of the hardware which may represent loosening. Electronically Signed   By: Constance Holster M.D.   On: 09/01/2020 22:19   CT Hip Right Wo Contrast  Result Date: 09/02/2020 CLINICAL DATA:  Hip trauma. EXAM: CT OF THE RIGHT HIP WITHOUT CONTRAST TECHNIQUE: Multidetector CT imaging of the right hip was performed  according to the standard protocol. Multiplanar CT image reconstructions were also generated. COMPARISON:  None. FINDINGS: Bones/Joint/Cartilage There is an acute, nondisplaced fracture through the greater trochanter of the right hip. There is no dislocation. Ligaments Suboptimally assessed by CT. Muscles and Tendons There is no acute intramuscular hematoma. Soft tissues The patient is status post prior hysterectomy. There is soft tissue swelling about the hip. IMPRESSION: Acute, nondisplaced fracture through the greater trochanter of the right hip. Electronically Signed   By: Constance Holster M.D.   On: 09/02/2020 03:54   DG Hip Unilat W or Wo Pelvis 2-3 Views Left  Result Date: 09/01/2020 CLINICAL DATA:  Pain status post fall. EXAM: DG HIP (WITH OR WITHOUT PELVIS) 2-3V LEFT; DG HIP (WITH OR WITHOUT PELVIS) 2-3V RIGHT COMPARISON:  None. FINDINGS: There is no acute displaced fracture or dislocation. Mild degenerative changes are noted of both hips. There are degenerative changes of the lower lumbar  spine, especially at the L4-L5 and L5-S1 levels. IMPRESSION: 1. No acute displaced fracture or dislocation. 2. Mild degenerative changes of both hips. Electronically Signed   By: Constance Holster M.D.   On: 09/01/2020 22:08   DG Hip Unilat W or Wo Pelvis 2-3 Views Right  Result Date: 09/01/2020 CLINICAL DATA:  Pain status post fall. EXAM: DG HIP (WITH OR WITHOUT PELVIS) 2-3V LEFT; DG HIP (WITH OR WITHOUT PELVIS) 2-3V RIGHT COMPARISON:  None. FINDINGS: There is no acute displaced fracture or dislocation. Mild degenerative changes are noted of both hips. There are degenerative changes of the lower lumbar spine, especially at the L4-L5 and L5-S1 levels. IMPRESSION: 1. No acute displaced fracture or dislocation. 2. Mild degenerative changes of both hips. Electronically Signed   By: Constance Holster M.D.   On: 09/01/2020 22:08        Scheduled Meds: . amLODipine  5 mg Oral Daily  . atorvastatin  40 mg Oral Daily  . gabapentin  800 mg Oral BID  . insulin aspart  0-5 Units Subcutaneous QHS  . insulin aspart  0-9 Units Subcutaneous TID WC  . sertraline  50 mg Oral Daily   Continuous Infusions: . sodium chloride 100 mL/hr at 09/02/20 1142     LOS: 0 days    Time spent: 32 mins     Wyvonnia Dusky, MD [facility] Pager [phone]  If 7PM-7AM, please contact night-coverage 09/03/2020, 7:56 AM

## 2020-09-03 NOTE — TOC Initial Note (Signed)
Transition of Care [facility]) - Initial/Assessment Note    Patient Details  Name: Rachel Huber MRN: [id number] Date of Birth: [birth year]  Transition of Care [facility]) CM/SW Contact:    Ova Freshwater Phone Number: [phone] 09/03/2020, 4:19 PM  Clinical Narrative:                  Patient present to [facility] due to SOB.  Patient is COVID +.  Patient stated she is able to perform all ADLs without assistance.  Patient stated she lives at home with Quetzalli, Clos (Spouse) [phone] [facility]).  Patient is able to get her medication and take her medication without issue.  Patient stated she is able to drive herself to doctor's appointments.  Patient stated her preferred SNF is [facility] but is open to any SNF.  CSW explained SNF placement process and estimated time, patient verbalized understanding. PT/OT recommend SNF and 3in1 bedside commode.  Expected Discharge Plan: Skilled Nursing Facility Barriers to Discharge: Continued Medical Work up   Patient Goals and CMS Choice Patient states their goals for this hospitalization and ongoing recovery are:: "I am willing to go to any rehab facility." CMS Medicare.gov Compare Post Acute Care list provided to:: Patient    Expected Discharge Plan and Services Expected Discharge Plan: [place] In-house Referral: Clinical Social Work   Post Acute Care Choice: [place] Living arrangements for the past 2 months: [place]                                      Prior Living Arrangements/Services Living arrangements for the past 2 months: Single Family Home Lives with:: Spouse Keelynn, F[place]erson (Spouse)   [phone] (Mobile)) Patient language and need for interpreter reviewed:: Yes Do you feel safe going back to the place where you live?: Yes      Need for Family Participation in Patient Care: No (Comment) Care giver support system in place?: No (comment)   Criminal Activity/Legal  Involvement Pertinent to Current Situation/Hospitalization: No - Comment as needed  Activities of Daily Living      Permission Sought/Granted Permission sought to share information with : Family Supports (Annet, Manukyan (Spouse)   [phone] (Mobile)) Permission granted to share information with : Yes, Verbal Permission Granted  Share Information with NAME: Toyna, Erisman (Spouse)   [phone] (Mobile)           Emotional Assessment Appearance:: Appears stated age Attitude/Demeanor/Rapport: Engaged Affect (typically observed): Stable Orientation: : Oriented to Self,Oriented to Place,Oriented to  Time,Oriented to Situation Alcohol / Substance Use: Not Applicable Psych Involvement: No (comment)  Admission diagnosis:  Closed right hip fracture ([place]) [S72.001A] Patient Active Problem List   Diagnosis Date Noted  . Closed right hip fracture ([place]) 09/02/2020  . Type II diabetes mellitus with renal manifestations ([place]) 09/02/2020  . Hypertension   . Thalassemia minor   . Pancytopenia ([place])   . HLD (hyperlipidemia)   . Acute renal failure superimposed on stage 3a chronic kidney disease ([place])   . Depression   . Fall at home, initial encounter   . Nondisplaced fracture of greater trochanter of right femur, initial encounter for closed fracture [facility])    PCP:  [name], MD Pharmacy:   [facility] [address], [place] - [place] [address] [place] [address] Phone: [phone] Fax: [phone]     Social Determinants of  Health (SDOH) Interventions    Readmission Risk Interventions No flowsheet data found.

## 2020-09-03 NOTE — Progress Notes (Signed)
Patient resting in bed with resp's easy, stating less pain post prn pain med given earlier . Awake/alert and oriented at this time.
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
## 2020-09-03 NOTE — ED Notes (Signed)
Pt incontinent of urine. Pts sheets changed and peri care provided. Pt in NAD at this time. VSS. Awaiting further orders. Will continue to monitor.

## 2020-09-03 NOTE — ED Notes (Signed)
Pt bedding changed, cleansed, purewick applied. Repositioned, provided with extra pillows

## 2020-09-04 ENCOUNTER — Encounter: Payer: Self-pay | Admitting: Internal Medicine

## 2020-09-04 DIAGNOSIS — F32A Depression, unspecified: Secondary | ICD-10-CM | POA: Diagnosis present

## 2020-09-04 DIAGNOSIS — E785 Hyperlipidemia, unspecified: Secondary | ICD-10-CM | POA: Diagnosis present

## 2020-09-04 DIAGNOSIS — N179 Acute kidney failure, unspecified: Secondary | ICD-10-CM | POA: Diagnosis present

## 2020-09-04 DIAGNOSIS — I959 Hypotension, unspecified: Secondary | ICD-10-CM | POA: Diagnosis not present

## 2020-09-04 DIAGNOSIS — F419 Anxiety disorder, unspecified: Secondary | ICD-10-CM | POA: Diagnosis present

## 2020-09-04 DIAGNOSIS — Z6837 Body mass index (BMI) 37.0-37.9, adult: Secondary | ICD-10-CM | POA: Diagnosis not present

## 2020-09-04 DIAGNOSIS — E1165 Type 2 diabetes mellitus with hyperglycemia: Secondary | ICD-10-CM | POA: Diagnosis present

## 2020-09-04 DIAGNOSIS — U071 COVID-19: Secondary | ICD-10-CM | POA: Diagnosis present

## 2020-09-04 DIAGNOSIS — M160 Bilateral primary osteoarthritis of hip: Secondary | ICD-10-CM | POA: Diagnosis present

## 2020-09-04 DIAGNOSIS — E1122 Type 2 diabetes mellitus with diabetic chronic kidney disease: Secondary | ICD-10-CM | POA: Diagnosis present

## 2020-09-04 DIAGNOSIS — G4730 Sleep apnea, unspecified: Secondary | ICD-10-CM | POA: Diagnosis present

## 2020-09-04 DIAGNOSIS — D61818 Other pancytopenia: Secondary | ICD-10-CM | POA: Diagnosis present

## 2020-09-04 DIAGNOSIS — K219 Gastro-esophageal reflux disease without esophagitis: Secondary | ICD-10-CM | POA: Diagnosis present

## 2020-09-04 DIAGNOSIS — W010XXA Fall on same level from slipping, tripping and stumbling without subsequent striking against object, initial encounter: Secondary | ICD-10-CM | POA: Diagnosis present

## 2020-09-04 DIAGNOSIS — K921 Melena: Secondary | ICD-10-CM | POA: Diagnosis not present

## 2020-09-04 DIAGNOSIS — E1140 Type 2 diabetes mellitus with diabetic neuropathy, unspecified: Secondary | ICD-10-CM | POA: Diagnosis present

## 2020-09-04 DIAGNOSIS — S72001A Fracture of unspecified part of neck of right femur, initial encounter for closed fracture: Secondary | ICD-10-CM | POA: Diagnosis present

## 2020-09-04 DIAGNOSIS — D563 Thalassemia minor: Secondary | ICD-10-CM | POA: Diagnosis present

## 2020-09-04 DIAGNOSIS — G629 Polyneuropathy, unspecified: Secondary | ICD-10-CM | POA: Diagnosis present

## 2020-09-04 DIAGNOSIS — S72114A Nondisplaced fracture of greater trochanter of right femur, initial encounter for closed fracture: Secondary | ICD-10-CM | POA: Diagnosis present

## 2020-09-04 DIAGNOSIS — N1831 Chronic kidney disease, stage 3a: Secondary | ICD-10-CM | POA: Diagnosis present

## 2020-09-04 DIAGNOSIS — I129 Hypertensive chronic kidney disease with stage 1 through stage 4 chronic kidney disease, or unspecified chronic kidney disease: Secondary | ICD-10-CM | POA: Diagnosis present

## 2020-09-04 DIAGNOSIS — J1282 Pneumonia due to coronavirus disease 2019: Secondary | ICD-10-CM | POA: Diagnosis not present

## 2020-09-04 DIAGNOSIS — S72001D Fracture of unspecified part of neck of right femur, subsequent encounter for closed fracture with routine healing: Secondary | ICD-10-CM | POA: Diagnosis not present

## 2020-09-04 DIAGNOSIS — J9601 Acute respiratory failure with hypoxia: Secondary | ICD-10-CM | POA: Diagnosis present

## 2020-09-04 DIAGNOSIS — E860 Dehydration: Secondary | ICD-10-CM | POA: Diagnosis present

## 2020-09-04 DIAGNOSIS — I10 Essential (primary) hypertension: Secondary | ICD-10-CM | POA: Diagnosis not present

## 2020-09-04 LAB — CBC
HCT: 25.8 % — ABNORMAL LOW (ref 36.0–46.0)
Hemoglobin: 7.9 g/dL — ABNORMAL LOW (ref 12.0–15.0)
MCH: 19.9 pg — ABNORMAL LOW (ref 26.0–34.0)
MCHC: 30.6 g/dL (ref 30.0–36.0)
MCV: 65.2 fL — ABNORMAL LOW (ref 80.0–100.0)
Platelets: 93 10*3/uL — ABNORMAL LOW (ref 150–400)
RBC: 3.96 MIL/uL (ref 3.87–5.11)
RDW: 14.5 % (ref 11.5–15.5)
WBC: 2.4 10*3/uL — ABNORMAL LOW (ref 4.0–10.5)
nRBC: 0 % (ref 0.0–0.2)

## 2020-09-04 LAB — BASIC METABOLIC PANEL
Anion gap: 9 (ref 5–15)
BUN: 21 mg/dL (ref 8–23)
CO2: 25 mmol/L (ref 22–32)
Calcium: 9 mg/dL (ref 8.9–10.3)
Chloride: 102 mmol/L (ref 98–111)
Creatinine, Ser: 1.05 mg/dL — ABNORMAL HIGH (ref 0.44–1.00)
GFR, Estimated: >60 mL/min (ref >60)
Glucose, Bld: 196 mg/dL — ABNORMAL HIGH (ref 70–99)
Potassium: 4.3 mmol/L (ref 3.5–5.1)
Sodium: 136 mmol/L (ref 135–145)

## 2020-09-04 LAB — CBG MONITORING, ED
Glucose-Capillary: 198 mg/dL — ABNORMAL HIGH (ref 70–99)
Glucose-Capillary: 252 mg/dL — ABNORMAL HIGH (ref 70–99)

## 2020-09-04 LAB — GLUCOSE, CAPILLARY
Glucose-Capillary: 135 mg/dL — ABNORMAL HIGH (ref 70–99)
Glucose-Capillary: 197 mg/dL — ABNORMAL HIGH (ref 70–99)

## 2020-09-04 NOTE — ED Notes (Signed)
Sent msg to receiving nurse re: patient.

## 2020-09-04 NOTE — ED Notes (Signed)
Lab called collect morning labs, stated they are aware.  
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
## 2020-09-04 NOTE — ED Notes (Addendum)
OT at bedside. Will administer 10am meds after OT finished.

## 2020-09-04 NOTE — ED Notes (Signed)
Pt placed on 2L via [place] due to desatting <85% while sleeping.
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
## 2020-09-04 NOTE — Progress Notes (Signed)
PROGRESS NOTE    Rachel Huber  GBT:[id number] DOB: [birth year] DOA: 09/02/2020 PCP: [name], MD   Assessment & Plan:   Principal Problem:   Closed right hip fracture [facility]) Active Problems:   Type II diabetes mellitus with renal manifestations ([place])   Hypertension   Thalassemia minor   Pancytopenia ([place])   HLD (hyperlipidemia)   Acute renal failure superimposed on stage 3a chronic kidney disease ([place])   Depression   Fall at home, initial encounter   Nondisplaced fracture of greater trochanter of right femur, initial encounter for closed fracture ([place])    Closed right hip fracture: nondisplaced fracture of greater trochanter of right femur.  No surgery needed as per ortho surg. Weight bearing as tolerated. Dilaudid, percocet prn. PT recs SNF & pt is agreeable to SNF   DM2: likely poorly controlled. Hold home dose of oral anti-hyperglycemics. Continue on SSI w/ accuchecks   HTN: continue on amlodipine. IV hydralazine prn   Thalassemia minor & pancytopenia: no need for transfusions currently. Will transfuse pRBCs if Hb <7.0  COVID19 infection: asymptomatic. No treatment indicated currently   HLD: continue on statin   AKI on CKDIIIa: baseline Cr 0.88. Cr is trending down from day prior. Continue on IVFs  Depression: severity unknown. Continue on home dose of zoloft.   Fall: PT recs SNF    DVT prophylaxis: SCDs Code Status: full  Family Communication:  Disposition Plan: d/c to SNF vs HH   Status is: Observation  Status is: Inpatient  Remains inpatient appropriate because: Unsafe d/c plan    Dispo: The patient is from: Home              Anticipated d/c is to: SNF              Anticipated d/c date is: whenever SNF bed is available               Patient currently is medically stable to d/c.     Consultants:   Ortho surg    Procedures:    Antimicrobials:    Subjective: Pt c/o hip pain still   Objective: Vitals:   09/03/20 2030  09/04/20 0216 09/04/20 0700 09/04/20 0800  BP: (!) 114/47 (!) 125/59 (!) 130/53   Pulse: 78 83 83   Resp: 20 20  16   Temp:    98.6 F (37 C)  TempSrc:    Oral  SpO2: 94% 91% (!) 89%   Weight:      Height:        Intake/Output Summary (Last 24 hours) at 09/04/2020 0825 Last data filed at 09/04/2020 0400 Gross per 24 hour  Intake -  Output 700 ml  Net -700 ml   Filed Weights   09/02/20 1517  Weight: 119.3 kg    Examination:  General exam: Appears uncomfortable. Morbidly obese Respiratory system: decreased breath sounds b/l otherwise clear   Cardiovascular system: S1/S2+. No rubs or clicks or gallops Gastrointestinal system: Abd is soft, NT, obese & normal bowel sounds  Central nervous system: Alert and oriented. Moves all 4 extremities  Psychiatry: Judgement and insight appear normal. Flat mood and affect    Data Reviewed: I have personally reviewed following labs and imaging studies  CBC: Recent Labs  Lab 09/02/20 0459 09/03/20 0441  WBC 3.8* 2.9*  NEUTROABS 2.8  --   HGB 7.6* 7.3*  HCT 24.8* 23.8*  MCV 65.1* 65.0*  PLT 99* 88*   Basic Metabolic Panel: Recent Labs  Lab  09/02/20 0459 09/03/20 0441  NA 135 137  K 4.3 4.1  CL 101 103  CO2 21* 24  GLUCOSE 265* 173*  BUN 27* 26*  CREATININE 1.33* 1.17*  CALCIUM 9.3 8.7*   GFR: Estimated Creatinine Clearance: 69.9 mL/min (A) (by C-G formula based on SCr of 1.17 mg/dL (H)). Liver Function Tests: No results for input(s): AST, ALT, ALKPHOS, BILITOT, PROT, ALBUMIN in the last 168 hours. No results for input(s): LIPASE, AMYLASE in the last 168 hours. No results for input(s): AMMONIA in the last 168 hours. Coagulation Profile: No results for input(s): INR, PROTIME in the last 168 hours. Cardiac Enzymes: No results for input(s): CKTOTAL, CKMB, CKMBINDEX, TROPONINI in the last 168 hours. BNP (last 3 results) No results for input(s): PROBNP in the last 8760 hours. HbA1C: Recent Labs    09/03/20 0441   HGBA1C 6.9*   CBG: Recent Labs  Lab 09/03/20 0842 09/03/20 1512 09/03/20 1956 09/03/20 2200 09/04/20 0734  GLUCAP 157* 137* 127* 128* 198*   Lipid Profile: No results for input(s): CHOL, HDL, LDLCALC, TRIG, CHOLHDL, LDLDIRECT in the last 72 hours. Thyroid Function Tests: No results for input(s): TSH, T4TOTAL, FREET4, T3FREE, THYROIDAB in the last 72 hours. Anemia Panel: No results for input(s): VITAMINB12, FOLATE, FERRITIN, TIBC, IRON, RETICCTPCT in the last 72 hours. Sepsis Labs: No results for input(s): PROCALCITON, LATICACIDVEN in the last 168 hours.  Recent Results (from the past 240 hour(s))  SARS CORONAVIRUS 2 (TAT 6-24 HRS) Nasopharyngeal Nasopharyngeal Swab     Status: Abnormal   Collection Time: 09/02/20  5:02 AM   Specimen: Nasopharyngeal Swab  Result Value Ref Range Status   SARS Coronavirus 2 POSITIVE (A) NEGATIVE Final    Comment: (NOTE) SARS-CoV-2 target nucleic acids are DETECTED.  The SARS-CoV-2 RNA is generally detectable in upper and lower respiratory specimens during the acute phase of infection. Positive results are indicative of the presence of SARS-CoV-2 RNA. Clinical correlation with patient history and other diagnostic information is  necessary to determine patient infection status. Positive results do not rule out bacterial infection or co-infection with other viruses.  The expected result is Negative.  Fact Sheet for Patients: SugarRoll.be  Fact Sheet for Healthcare Providers: https://www.woods-mathews.com/  This test is not yet approved or cleared by the [place] FDA and  has been authorized for detection and/or diagnosis of SARS-CoV-2 by FDA under an Emergency Use Authorization (EUA). This EUA will remain  in effect (meaning this test can be used) for the duration of the COVID-19 declaration under Section 564(b)(1) of the Act, 21 U. S.C. section 360bbb-3(b)(1), unless the authorization is  terminated or revoked sooner.   Performed at [facility], [place] [address], [place], [place] [address]          Radiology Studies: No results found.      Scheduled Meds: . amLODipine  5 mg Oral Daily  . atorvastatin  40 mg Oral Daily  . gabapentin  800 mg Oral BID  . insulin aspart  0-5 Units Subcutaneous QHS  . insulin aspart  0-9 Units Subcutaneous TID WC  . sertraline  50 mg Oral Daily   Continuous Infusions: . sodium chloride 75 mL/hr at 09/04/20 0126     LOS: 0 days    Time spent: 30 mins     Wyvonnia Dusky, MD [facility] Pager [phone]  If 7PM-7AM, please contact night-coverage 09/04/2020, 8:25 AM

## 2020-09-04 NOTE — ED Notes (Signed)
PT at bedside.

## 2020-09-05 DIAGNOSIS — S72001A Fracture of unspecified part of neck of right femur, initial encounter for closed fracture: Secondary | ICD-10-CM | POA: Diagnosis not present

## 2020-09-05 LAB — CBC
HCT: 25.2 % — ABNORMAL LOW (ref 36.0–46.0)
Hemoglobin: 7.7 g/dL — ABNORMAL LOW (ref 12.0–15.0)
MCH: 19.9 pg — ABNORMAL LOW (ref 26.0–34.0)
MCHC: 30.6 g/dL (ref 30.0–36.0)
MCV: 65.3 fL — ABNORMAL LOW (ref 80.0–100.0)
Platelets: 89 10*3/uL — ABNORMAL LOW (ref 150–400)
RBC: 3.86 MIL/uL — ABNORMAL LOW (ref 3.87–5.11)
RDW: 14.5 % (ref 11.5–15.5)
WBC: 2.1 10*3/uL — ABNORMAL LOW (ref 4.0–10.5)
nRBC: 0 % (ref 0.0–0.2)

## 2020-09-05 LAB — GLUCOSE, CAPILLARY
Glucose-Capillary: 154 mg/dL — ABNORMAL HIGH (ref 70–99)
Glucose-Capillary: 263 mg/dL — ABNORMAL HIGH (ref 70–99)
Glucose-Capillary: 238 mg/dL — ABNORMAL HIGH (ref 70–99)
Glucose-Capillary: 212 mg/dL — ABNORMAL HIGH (ref 70–99)

## 2020-09-05 LAB — BASIC METABOLIC PANEL
Anion gap: 9 (ref 5–15)
BUN: 18 mg/dL (ref 8–23)
CO2: 26 mmol/L (ref 22–32)
Calcium: 8.9 mg/dL (ref 8.9–10.3)
Chloride: 102 mmol/L (ref 98–111)
Creatinine, Ser: 1.04 mg/dL — ABNORMAL HIGH (ref 0.44–1.00)
GFR, Estimated: >60 mL/min (ref >60)
Glucose, Bld: 156 mg/dL — ABNORMAL HIGH (ref 70–99)
Potassium: 4.3 mmol/L (ref 3.5–5.1)
Sodium: 137 mmol/L (ref 135–145)

## 2020-09-05 NOTE — NC FL2 (Signed)
[place] LEVEL OF CARE SCREENING TOOL     IDENTIFICATION  Patient Name: Rachel Huber Birthdate: 24-Jan-1956 Sex: female Admission Date (Current Location): 09/02/2020  [place] and [place] Number:  Engineering geologist and Address:  [facility], [address], [place], [place] [address]      Provider Number: [id number]  Attending Physician Name and Address:  Jennye Boroughs, MD  Relative Name and Phone Number:  Sherin, Murdoch (Spouse)   [phone] [facility])    Current Level of Care: Hospital Recommended Level of Care: [place] Prior Approval Number:    Date Approved/Denied:   PASRR Number: [id number]  Discharge Plan: SNF    Current Diagnoses: Patient Active Problem List   Diagnosis Date Noted  . Hip fracture, right ([place]) 09/04/2020  . Closed right hip fracture ([place]) 09/02/2020  . Type II diabetes mellitus with renal manifestations ([place]) 09/02/2020  . Hypertension   . Thalassemia minor   . Pancytopenia ([place])   . HLD (hyperlipidemia)   . Acute renal failure superimposed on stage 3a chronic kidney disease ([place])   . Depression   . Fall at home, initial encounter   . Nondisplaced fracture of greater trochanter of right femur, initial encounter for closed fracture (HCC)     Orientation RESPIRATION BLADDER Height & Weight     Self,Time,Situation,Place  Normal External catheter Weight: 263 lb (119.3 kg) Height:  5\' 10"  (177.8 cm)  BEHAVIORAL SYMPTOMS/MOOD NEUROLOGICAL BOWEL NUTRITION STATUS      Continent Diet (heart healthy/carb modified, thin liquids)  AMBULATORY STATUS COMMUNICATION OF NEEDS Skin   Extensive Assist Verbally Bruising (R hip)                       Personal Care Assistance Level of Assistance  Bathing,Feeding,Dressing Bathing Assistance: Maximum assistance Feeding assistance: Independent Dressing Assistance: Maximum assistance     Functional Limitations Info              SPECIAL CARE FACTORS FREQUENCY  PT (By licensed PT),OT (By licensed OT)     PT Frequency: 5 x/week OT Frequency: 5 x/week            Contractures      Additional Factors Info  Code Status,Allergies Code Status Info: full code Allergies Info: latex           Current Medications (09/05/2020):  This is the current hospital active medication list Current Facility-Administered Medications  Medication Dose Route Frequency Provider Last Rate Last Admin  . 0.9 %  sodium chloride infusion   Intravenous Continuous Wyvonnia Dusky, MD 75 mL/hr at 09/04/20 1047 Rate Verify at 09/04/20 1047  . acetaminophen (TYLENOL) tablet 650 mg  650 mg Oral Q6H PRN Ivor Costa, MD      . amLODipine (NORVASC) tablet 5 mg  5 mg Oral Daily Ivor Costa, MD   5 mg at 09/05/20 0850  . atorvastatin (LIPITOR) tablet 40 mg  40 mg Oral Daily Ivor Costa, MD   40 mg at 09/05/20 0900  . diphenhydrAMINE (BENADRYL) tablet 25 mg  25 mg Oral Daily PRN Ivor Costa, MD      . gabapentin (NEURONTIN) capsule 800 mg  800 mg Oral BID Ivor Costa, MD   800 mg at 09/05/20 0851  . hydrALAZINE (APRESOLINE) injection 5 mg  5 mg Intravenous Q2H PRN Ivor Costa, MD      . HYDROmorphone (DILAUDID) injection 1 mg  1 mg Intravenous Q4H PRN Ivor Costa,  MD   1 mg at 09/03/20 1407  . insulin aspart (novoLOG) injection 0-5 Units  0-5 Units Subcutaneous QHS Ivor Costa, MD      . insulin aspart (novoLOG) injection 0-9 Units  0-9 Units Subcutaneous TID WC Ivor Costa, MD   2 Units at 09/05/20 [phone]  . methocarbamol (ROBAXIN) tablet 500 mg  500 mg Oral Q8H PRN Ivor Costa, MD      . ondansetron [facility]) injection 4 mg  4 mg Intravenous Q8H PRN Ivor Costa, MD      . oxyCODONE-acetaminophen (PERCOCET/ROXICET) 5-325 MG per tablet 1 tablet  1 tablet Oral Q4H PRN Ivor Costa, MD   1 tablet at 09/04/20 2129  . senna-docusate (Senokot-S) tablet 1 tablet  1 tablet Oral QHS PRN Ivor Costa, MD      . sertraline (ZOLOFT) tablet 50 mg  50 mg Oral Daily  Ivor Costa, MD   50 mg at 09/05/20 2025     Discharge Medications: Please see discharge summary for a list of discharge medications.  Relevant Imaging Results:  Relevant Lab Results:   Additional Information SS #: [id number]  ;    COVID positive 09/02/20  [place] Sen E Rosea Dory, LCSW

## 2020-09-05 NOTE — TOC Progression Note (Signed)
Transition of Care [facility]) - Progression Note    Patient Details  Name: Rachel Huber MRN: [id number] Date of Birth: 1956-07-13  Transition of Care [facility]) CM/SW [place], LCSW Phone Number: 09/05/2020, 10:58 AM  Clinical Narrative:       Called into patient's room due to Airborne Precautions. Patient still agreeable to SNF rehab. Explained will have to look for SNF taking COVID positive patients since patient was positive 3 days ago. Patient verbalized understanding. CSW starting SNF work up.     Expected Discharge Plan: [place] Barriers to Discharge: Continued Medical Work up  Expected Discharge Plan and Services Expected Discharge Plan: [place] In-house Referral: Clinical Social Work   Post Acute Care Choice: [place] Living arrangements for the past 2 months: Single Family Home                                       Social Determinants of Health (SDOH) Interventions    Readmission Risk Interventions No flowsheet data found.
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
## 2020-09-05 NOTE — Progress Notes (Signed)
Progress Note    Rachel Huber  QMG:[id number] DOB: 05/08/1956  DOA: 09/02/2020 PCP: [name], MD      Brief Narrative:    Medical records reviewed and are as summarized below:  Rachel Huber is a 65 y.o. female       Assessment/Plan:   Principal Problem:   Closed right hip fracture (HCC) Active Problems:   Type II diabetes mellitus with renal manifestations ([place])   Hypertension   Thalassemia minor   Pancytopenia (HCC)   HLD (hyperlipidemia)   Acute renal failure superimposed on stage 3a chronic kidney disease ([place])   Depression   Fall at home, initial encounter   Nondisplaced fracture of greater trochanter of right femur, initial encounter for closed fracture ([place])   Hip fracture, right ([place])    Body mass index is 37.74 kg/m.  Morbid obesity)    PLAN  Continue analgesics as needed right hip fracture/pain  Creatinine is improved.  Discontinue IV fluids.  Continue NovoLog as needed for hyperglycemia.  Continue current home medications for comorbidities.  There is no indication for treatment for COVID-19 infection at this time.  She is asymptomatic.  Awaiting placement to SNF.  Diet Order            Diet heart healthy/carb modified Room service appropriate? Yes; Fluid consistency: Thin  Diet effective now                    Consultants:  Orthopedic surgeon  Procedures:  None    Medications:   . amLODipine  5 mg Oral Daily  . atorvastatin  40 mg Oral Daily  . gabapentin  800 mg Oral BID  . insulin aspart  0-5 Units Subcutaneous QHS  . insulin aspart  0-9 Units Subcutaneous TID WC  . sertraline  50 mg Oral Daily   Continuous Infusions:    Anti-infectives (From admission, onward)   None             Family Communication/Anticipated D/C date and plan/Code Status   DVT prophylaxis: Place and maintain sequential compression device Start: 09/04/20 1549 SCDs Start: 09/02/20 8916     Code Status: Full  Code  Family Communication: None Disposition Plan:    Status is: Inpatient  Remains inpatient appropriate because:Unsafe d/c plan   Dispo: The patient is from: Home              Anticipated d/c is to: SNF              Anticipated d/c date is: 1 day              Patient currently is medically stable to d/c.           Subjective:   C/o pain in the right hip but is not too bad.  No shortness of breath, cough or chest pain.  Objective:    Vitals:   09/05/20 0400 09/05/20 0522 09/05/20 0734 09/05/20 1148  BP:  (!) 123/52 (!) 121/49 (!) 115/43  Pulse: 80 88 96 99  Resp: 20 20 18 20   Temp:  99.9 F (37.7 C) 98.2 F (36.8 C) (!) 100.7 F (38.2 C)  TempSrc:      SpO2: 96% 96% 94% 94%  Weight:      Height:       No data found.   Intake/Output Summary (Last 24 hours) at 09/05/2020 1432 Last data filed at 09/05/2020 0900 Gross per 24 hour  Intake  1240 ml  Output 800 ml  Net 440 ml   Filed Weights   09/02/20 1517  Weight: 119.3 kg    Exam:  GEN: NAD SKIN: Warm and dry.  Small bruise noted on the right hip. EYES: No pallor or icterus ENT: MMM CV: RRR PULM: CTA B ABD: soft, obese, NT, +BS CNS: AAO x 3, non focal EXT: No edema or tenderness    Data Reviewed:   I have personally reviewed following labs and imaging studies:  Labs: Labs show the following:   Basic Metabolic Panel: Recent Labs  Lab 09/02/20 0459 09/03/20 0441 09/04/20 0745 09/05/20 0608  NA 135 137 136 137  K 4.3 4.1 4.3 4.3  CL 101 103 102 102  CO2 21* 24 25 26   GLUCOSE 265* 173* 196* 156*  BUN 27* 26* 21 18  CREATININE 1.33* 1.17* 1.05* 1.04*  CALCIUM 9.3 8.7* 9.0 8.9   GFR Estimated Creatinine Clearance: 78.6 mL/min (A) (by C-G formula based on SCr of 1.04 mg/dL (H)). Liver Function Tests: No results for input(s): AST, ALT, ALKPHOS, BILITOT, PROT, ALBUMIN in the last 168 hours. No results for input(s): LIPASE, AMYLASE in the last 168 hours. No results for input(s):  AMMONIA in the last 168 hours. Coagulation profile No results for input(s): INR, PROTIME in the last 168 hours.  CBC: Recent Labs  Lab 09/02/20 0459 09/03/20 0441 09/04/20 0745 09/05/20 0608  WBC 3.8* 2.9* 2.4* 2.1*  NEUTROABS 2.8  --   --   --   HGB 7.6* 7.3* 7.9* 7.7*  HCT 24.8* 23.8* 25.8* 25.2*  MCV 65.1* 65.0* 65.2* 65.3*  PLT 99* 88* 93* 89*   Cardiac Enzymes: No results for input(s): CKTOTAL, CKMB, CKMBINDEX, TROPONINI in the last 168 hours. BNP (last 3 results) No results for input(s): PROBNP in the last 8760 hours. CBG: Recent Labs  Lab 09/04/20 1243 09/04/20 1645 09/04/20 2053 09/05/20 0737 09/05/20 1226  GLUCAP 252* 135* 197* 154* 263*   D-Dimer: No results for input(s): DDIMER in the last 72 hours. Hgb A1c: Recent Labs    09/03/20 0441  HGBA1C 6.9*   Lipid Profile: No results for input(s): CHOL, HDL, LDLCALC, TRIG, CHOLHDL, LDLDIRECT in the last 72 hours. Thyroid function studies: No results for input(s): TSH, T4TOTAL, T3FREE, THYROIDAB in the last 72 hours.  Invalid input(s): FREET3 Anemia work up: No results for input(s): VITAMINB12, FOLATE, FERRITIN, TIBC, IRON, RETICCTPCT in the last 72 hours. Sepsis Labs: Recent Labs  Lab 09/02/20 0459 09/03/20 0441 09/04/20 0745 09/05/20 0608  WBC 3.8* 2.9* 2.4* 2.1*    Microbiology Recent Results (from the past 240 hour(s))  SARS CORONAVIRUS 2 (TAT 6-24 HRS) Nasopharyngeal Nasopharyngeal Swab     Status: Abnormal   Collection Time: 09/02/20  5:02 AM   Specimen: Nasopharyngeal Swab  Result Value Ref Range Status   SARS Coronavirus 2 POSITIVE (A) NEGATIVE Final    Comment: (NOTE) SARS-CoV-2 target nucleic acids are DETECTED.  The SARS-CoV-2 RNA is generally detectable in upper and lower respiratory specimens during the acute phase of infection. Positive results are indicative of the presence of SARS-CoV-2 RNA. Clinical correlation with patient history and other diagnostic information is   necessary to determine patient infection status. Positive results do not rule out bacterial infection or co-infection with other viruses.  The expected result is Negative.  Fact Sheet for Patients: SugarRoll.be  Fact Sheet for Healthcare Providers: https://www.woods-mathews.com/  This test is not yet approved or cleared by the [place] FDA and  has been authorized for detection and/or diagnosis of SARS-CoV-2 by FDA under an Emergency Use Authorization (EUA). This EUA will remain  in effect (meaning this test can be used) for the duration of the COVID-19 declaration under Section 564(b)(1) of the Act, 21 U. S.C. section 360bbb-3(b)(1), unless the authorization is terminated or revoked sooner.   Performed at [facility], [place] [address] [place], [place] [address]     Procedures and diagnostic studies:  No results found.             LOS: 1 day   Trayce Caravello  [facility]   Pager on [email or web address]. If 7PM-7AM, please contact night-coverage at [email or web address]     09/05/2020, 2:32 PM

## 2020-09-06 DIAGNOSIS — S72001A Fracture of unspecified part of neck of right femur, initial encounter for closed fracture: Secondary | ICD-10-CM | POA: Diagnosis not present

## 2020-09-06 DIAGNOSIS — D563 Thalassemia minor: Secondary | ICD-10-CM | POA: Diagnosis not present

## 2020-09-06 LAB — BASIC METABOLIC PANEL
Anion gap: 8 (ref 5–15)
BUN: 18 mg/dL (ref 8–23)
CO2: 26 mmol/L (ref 22–32)
Calcium: 8.7 mg/dL — ABNORMAL LOW (ref 8.9–10.3)
Chloride: 102 mmol/L (ref 98–111)
Creatinine, Ser: 1.04 mg/dL — ABNORMAL HIGH (ref 0.44–1.00)
GFR, Estimated: >60 mL/min (ref >60)
Glucose, Bld: 148 mg/dL — ABNORMAL HIGH (ref 70–99)
Potassium: 4.1 mmol/L (ref 3.5–5.1)
Sodium: 136 mmol/L (ref 135–145)

## 2020-09-06 LAB — CBC WITH DIFFERENTIAL/PLATELET
Abs Immature Granulocytes: 0.01 10*3/uL (ref 0.00–0.07)
Basophils Absolute: 0 10*3/uL (ref 0.0–0.1)
Basophils Relative: 0 %
Eosinophils Absolute: 0 10*3/uL (ref 0.0–0.5)
Eosinophils Relative: 1 %
HCT: 23.4 % — ABNORMAL LOW (ref 36.0–46.0)
Hemoglobin: 7 g/dL — ABNORMAL LOW (ref 12.0–15.0)
Immature Granulocytes: 1 %
Lymphocytes Relative: 44 %
Lymphs Abs: 0.8 10*3/uL (ref 0.7–4.0)
MCH: 19.6 pg — ABNORMAL LOW (ref 26.0–34.0)
MCHC: 29.9 g/dL — ABNORMAL LOW (ref 30.0–36.0)
MCV: 65.4 fL — ABNORMAL LOW (ref 80.0–100.0)
Monocytes Absolute: 0.2 10*3/uL (ref 0.1–1.0)
Monocytes Relative: 9 %
Neutro Abs: 0.8 10*3/uL — ABNORMAL LOW (ref 1.7–7.7)
Neutrophils Relative %: 45 %
Platelets: 80 10*3/uL — ABNORMAL LOW (ref 150–400)
RBC: 3.58 MIL/uL — ABNORMAL LOW (ref 3.87–5.11)
RDW: 14.2 % (ref 11.5–15.5)
Smear Review: NORMAL
WBC: 1.7 10*3/uL — ABNORMAL LOW (ref 4.0–10.5)
nRBC: 0 % (ref 0.0–0.2)

## 2020-09-06 LAB — GLUCOSE, CAPILLARY
Glucose-Capillary: 150 mg/dL — ABNORMAL HIGH (ref 70–99)
Glucose-Capillary: 246 mg/dL — ABNORMAL HIGH (ref 70–99)
Glucose-Capillary: 209 mg/dL — ABNORMAL HIGH (ref 70–99)
Glucose-Capillary: 159 mg/dL — ABNORMAL HIGH (ref 70–99)

## 2020-09-06 NOTE — Progress Notes (Signed)
Progress Note    Rachel Huber  ZOX:[id number] DOB: January 23, 1956  DOA: 09/02/2020 PCP: [name], MD      Brief Narrative:    Medical records reviewed and are as summarized below:  Rachel Huber is a 65 y.o. female       Assessment/Plan:   Principal Problem:   Closed right hip fracture (HCC) Active Problems:   Type II diabetes mellitus with renal manifestations ([place])   Hypertension   Thalassemia minor   Pancytopenia (HCC)   HLD (hyperlipidemia)   Acute renal failure superimposed on stage 3a chronic kidney disease ([place])   Depression   Fall at home, initial encounter   Nondisplaced fracture of greater trochanter of right femur, initial encounter for closed fracture ([place])   Hip fracture, right ([place])    Body mass index is 37.74 kg/m.  Morbid obesity)    PLAN  Continue analgesics as needed for right hip fracture.  Creatinine is stable.  Continue NovoLog as needed for hyperglycemia.  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  Hemoglobin is down to 7.0 but she is asymptomatic.  No indication for blood transfusion at this time.  Patient is okay with this plan.  Continue current home medications for comorbidities.  There is no indication for treatment for COVID-19 infection at this time.  She is asymptomatic.  Awaiting placement to SNF.  Diet Order            Diet heart healthy/carb modified Room service appropriate? Yes; Fluid consistency: Thin  Diet effective now                    Consultants:  Orthopedic surgeon  Procedures:  None    Medications:   . amLODipine  5 mg Oral Daily  . atorvastatin  40 mg Oral Daily  . gabapentin  800 mg Oral BID  . insulin aspart  0-5 Units Subcutaneous QHS  . insulin aspart  0-9 Units Subcutaneous TID WC  . sertraline  50 mg Oral Daily   Continuous Infusions:    Anti-infectives (From admission, onward)   None             Family Communication/Anticipated D/C date and  plan/Code Status   DVT prophylaxis: Place and maintain sequential compression device Start: 09/04/20 1549 SCDs Start: 09/02/20 8119     Code Status: Full Code  Family Communication: None Disposition Plan:    Status is: Inpatient  Remains inpatient appropriate because:Unsafe d/c plan   Dispo: The patient is from: Home              Anticipated d/c is to: SNF              Anticipated d/c date is: 1 day              Patient currently is medically stable to d/c.           Subjective:   She complains of pain in the right hip.  No respiratory symptoms.  Objective:    Vitals:   09/06/20 0119 09/06/20 0509 09/06/20 0744 09/06/20 1156  BP: (!) 117/44 (!) 127/56 (!) 108/50 (!) 104/43  Pulse: 84 90 88 95  Resp: 20 20 19 20   Temp: 98.8 F (37.1 C) 98.6 F (37 C) 98.3 F (36.8 C) 98 F (36.7 C)  TempSrc:      SpO2: 97% 100% 90% 94%  Weight:      Height:  No data found.   Intake/Output Summary (Last 24 hours) at 09/06/2020 1425 Last data filed at 09/06/2020 0800 Gross per 24 hour  Intake 360 ml  Output --  Net 360 ml   Filed Weights   09/02/20 1517  Weight: 119.3 kg    Exam:  GEN: NAD SKIN: Warm and dry.  Small bruise on right hip EYES: EOMI ENT: MMM CV: RRR PULM: CTA B ABD: soft, obese, NT, +BS CNS: AAO x 3, non focal EXT: No edema or tenderness        Data Reviewed:   I have personally reviewed following labs and imaging studies:  Labs: Labs show the following:   Basic Metabolic Panel: Recent Labs  Lab 09/02/20 0459 09/03/20 0441 09/04/20 0745 09/05/20 0608 09/06/20 0614  NA 135 137 136 137 136  K 4.3 4.1 4.3 4.3 4.1  CL 101 103 102 102 102  CO2 21* 24 25 26 26   GLUCOSE 265* 173* 196* 156* 148*  BUN 27* 26* 21 18 18   CREATININE 1.33* 1.17* 1.05* 1.04* 1.04*  CALCIUM 9.3 8.7* 9.0 8.9 8.7*   GFR Estimated Creatinine Clearance: 78.6 mL/min (A) (by C-G formula based on SCr of 1.04 mg/dL (H)). Liver Function Tests: No  results for input(s): AST, ALT, ALKPHOS, BILITOT, PROT, ALBUMIN in the last 168 hours. No results for input(s): LIPASE, AMYLASE in the last 168 hours. No results for input(s): AMMONIA in the last 168 hours. Coagulation profile No results for input(s): INR, PROTIME in the last 168 hours.  CBC: Recent Labs  Lab 09/02/20 0459 09/03/20 0441 09/04/20 0745 09/05/20 0608 09/06/20 0614  WBC 3.8* 2.9* 2.4* 2.1* 1.7*  NEUTROABS 2.8  --   --   --  0.8*  HGB 7.6* 7.3* 7.9* 7.7* 7.0*  HCT 24.8* 23.8* 25.8* 25.2* 23.4*  MCV 65.1* 65.0* 65.2* 65.3* 65.4*  PLT 99* 88* 93* 89* 80*   Cardiac Enzymes: No results for input(s): CKTOTAL, CKMB, CKMBINDEX, TROPONINI in the last 168 hours. BNP (last 3 results) No results for input(s): PROBNP in the last 8760 hours. CBG: Recent Labs  Lab 09/05/20 1226 09/05/20 1638 09/05/20 2027 09/06/20 0819 09/06/20 1233  GLUCAP 263* 238* 212* 150* 246*   D-Dimer: No results for input(s): DDIMER in the last 72 hours. Hgb A1c: No results for input(s): HGBA1C in the last 72 hours. Lipid Profile: No results for input(s): CHOL, HDL, LDLCALC, TRIG, CHOLHDL, LDLDIRECT in the last 72 hours. Thyroid function studies: No results for input(s): TSH, T4TOTAL, T3FREE, THYROIDAB in the last 72 hours.  Invalid input(s): FREET3 Anemia work up: No results for input(s): VITAMINB12, FOLATE, FERRITIN, TIBC, IRON, RETICCTPCT in the last 72 hours. Sepsis Labs: Recent Labs  Lab 09/03/20 0441 09/04/20 0745 09/05/20 0608 09/06/20 0614  WBC 2.9* 2.4* 2.1* 1.7*    Microbiology Recent Results (from the past 240 hour(s))  SARS CORONAVIRUS 2 (TAT 6-24 HRS) Nasopharyngeal Nasopharyngeal Swab     Status: Abnormal   Collection Time: 09/02/20  5:02 AM   Specimen: Nasopharyngeal Swab  Result Value Ref Range Status   SARS Coronavirus 2 POSITIVE (A) NEGATIVE Final    Comment: (NOTE) SARS-CoV-2 target nucleic acids are DETECTED.  The SARS-CoV-2 RNA is generally detectable in  upper and lower respiratory specimens during the acute phase of infection. Positive results are indicative of the presence of SARS-CoV-2 RNA. Clinical correlation with patient history and other diagnostic information is  necessary to determine patient infection status. Positive results do not rule out bacterial infection or  co-infection with other viruses.  The expected result is Negative.  Fact Sheet for Patients: SugarRoll.be  Fact Sheet for Healthcare Providers: https://www.woods-mathews.com/  This test is not yet approved or cleared by the [place] FDA and  has been authorized for detection and/or diagnosis of SARS-CoV-2 by FDA under an Emergency Use Authorization (EUA). This EUA will remain  in effect (meaning this test can be used) for the duration of the COVID-19 declaration under Section 564(b)(1) of the Act, 21 U. S.C. section 360bbb-3(b)(1), unless the authorization is terminated or revoked sooner.   Performed at [facility], [place] [address] [place], [place] [address]     Procedures and diagnostic studies:  No results found.             LOS: 2 days   Shravya Wickwire  [facility]   Pager on [email or web address]. If 7PM-7AM, please contact night-coverage at [email or web address]     09/06/2020, 2:25 PM
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
## 2020-09-06 NOTE — TOC Progression Note (Signed)
Transition of Care [facility]) - Progression Note    Patient Details  Name: Rachel Huber MRN: [id number] Date of Birth: Jun 09, 1956  Transition of Care [facility]) CM/SW [place], LCSW Phone Number: 09/06/2020, 1:16 PM  Clinical Narrative:   Waiting on COVID SNF bed. None available today per [facility] team.    Expected Discharge Plan: [place] Barriers to Discharge: Continued Medical Work up  Expected Discharge Plan and Services Expected Discharge Plan: [place] In-house Referral: Clinical Social Work   Post Acute Care Choice: [place] Living arrangements for the past 2 months: Single Family Home                                       Social Determinants of Health (SDOH) Interventions    Readmission Risk Interventions No flowsheet data found.

## 2020-09-07 DIAGNOSIS — S72001D Fracture of unspecified part of neck of right femur, subsequent encounter for closed fracture with routine healing: Secondary | ICD-10-CM

## 2020-09-07 DIAGNOSIS — D61818 Other pancytopenia: Secondary | ICD-10-CM | POA: Diagnosis not present

## 2020-09-07 LAB — GLUCOSE, CAPILLARY
Glucose-Capillary: 156 mg/dL — ABNORMAL HIGH (ref 70–99)
Glucose-Capillary: 282 mg/dL — ABNORMAL HIGH (ref 70–99)
Glucose-Capillary: 237 mg/dL — ABNORMAL HIGH (ref 70–99)
Glucose-Capillary: 147 mg/dL — ABNORMAL HIGH (ref 70–99)

## 2020-09-07 LAB — CBC
HCT: 24.2 % — ABNORMAL LOW (ref 36.0–46.0)
Hemoglobin: 7.2 g/dL — ABNORMAL LOW (ref 12.0–15.0)
MCH: 19.3 pg — ABNORMAL LOW (ref 26.0–34.0)
MCHC: 29.8 g/dL — ABNORMAL LOW (ref 30.0–36.0)
MCV: 64.9 fL — ABNORMAL LOW (ref 80.0–100.0)
Platelets: 90 10*3/uL — ABNORMAL LOW (ref 150–400)
RBC: 3.73 MIL/uL — ABNORMAL LOW (ref 3.87–5.11)
RDW: 14.4 % (ref 11.5–15.5)
WBC: 2.4 10*3/uL — ABNORMAL LOW (ref 4.0–10.5)
nRBC: 0 % (ref 0.0–0.2)

## 2020-09-07 LAB — HEMOGLOBIN AND HEMATOCRIT, BLOOD
HCT: 24.5 % — ABNORMAL LOW (ref 36.0–46.0)
Hemoglobin: 7.4 g/dL — ABNORMAL LOW (ref 12.0–15.0)

## 2020-09-07 MED ORDER — SODIUM CHLORIDE 0.9 % IV SOLN: INTRAVENOUS | Status: DC

## 2020-09-07 NOTE — Progress Notes (Signed)
Progress Note    Rachel Huber  BBC:[id number] DOB: Dec 19, 1955  DOA: 09/02/2020 PCP: [name], MD      Brief Narrative:    Medical records reviewed and are as summarized below:  Rachel Huber is a 65 y.o. female       Assessment/Plan:   Principal Problem:   Closed right hip fracture (HCC) Active Problems:   Type II diabetes mellitus with renal manifestations ([place])   Hypertension   Thalassemia minor   Pancytopenia (HCC)   HLD (hyperlipidemia)   Acute renal failure superimposed on stage 3a chronic kidney disease ([place])   Depression   Fall at home, initial encounter   Nondisplaced fracture of greater trochanter of right femur, initial encounter for closed fracture ([place])   Hip fracture, right ([place]) Rectal bleeding Hypotension  Body mass index is 37.74 kg/m.  Morbid obesity)    PLAN  Monitor H&H because of rectal bleeding.  She is undecided about blood transfusion if the need arises.  Avoid pharmacologic DVT prophylaxis.  Use SCDs for DVT prophylaxis.  Patient has been encouraged to use SCDs when laying in bed.  Start IV fluids because of hypotension and rectal bleeding.  Hold amlodipine because of hypotension.  Creatinine is stable.  Continue NovoLog as needed for hyperglycemia.   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  Continue current home medications for comorbidities.  There is no indication for treatment for COVID-19 infection at this time.  She is asymptomatic.  Awaiting placement to SNF.  Diet Order            Diet heart healthy/carb modified Room service appropriate? Yes; Fluid consistency: Thin  Diet effective now                    Consultants:  Orthopedic surgeon  Procedures:  None    Medications:   . atorvastatin  40 mg Oral Daily  . gabapentin  800 mg Oral BID  . insulin aspart  0-5 Units Subcutaneous QHS  . insulin aspart  0-9 Units Subcutaneous TID WC  . sertraline  50 mg Oral Daily    Continuous Infusions: . sodium chloride       Anti-infectives (From admission, onward)   None             Family Communication/Anticipated D/C date and plan/Code Status   DVT prophylaxis: Place and maintain sequential compression device Start: 09/04/20 1549 SCDs Start: 09/02/20 5038     Code Status: Full Code  Family Communication: None Disposition Plan:    Status is: Inpatient  Remains inpatient appropriate because:Unsafe d/c plan   Dispo: The patient is from: Home              Anticipated d/c is to: SNF              Anticipated d/c date is: 1 day              Patient currently is medically stable to d/c.           Subjective:   Interval events noted.  She passed bright red bloody stools last night.  She had bloody stools again this morning.  She said she had blood clots in the stool.  She said she has had bloody stools in the past and it actually started about 5 years ago.  She had a colonoscopy about 5 years ago and they found some polyps that were removed.  She said she is  due for another colonoscopy this year.  She thinks she may have hemorrhoids as well but she is not sure.  She said she has mentioned bloody stool episodes to her gynecologist in the past.  Objective:    Vitals:   09/07/20 0017 09/07/20 0552 09/07/20 0749 09/07/20 1215  BP: 100/71 (!) 121/56 (!) 124/51 95/71  Pulse: 92 86 87 92  Resp: 18 19 18 20   Temp: 99.3 F (37.4 C) 98.4 F (36.9 C) 99.7 F (37.6 C) 99.4 F (37.4 C)  TempSrc: Oral Oral    SpO2: 93% 94% 93% 92%  Weight:      Height:       No data found.   Intake/Output Summary (Last 24 hours) at 09/07/2020 1241 Last data filed at 09/07/2020 0500 Gross per 24 hour  Intake --  Output 800 ml  Net -800 ml   Filed Weights   09/02/20 1517  Weight: 119.3 kg    Exam:  GEN: NAD SKIN: Warm and dry EYES: EOMI ENT: MMM CV: RRR PULM: CTA B ABD: soft, obese, NT, +BS CNS: AAO x 3, non focal EXT: Tenderness  right hip RECTAL: On inspection, there were no obvious lesions/external hemorrhoids.  No lesions were felt in the rectum on palpation.  There was bright red blood on the examining finger.   [place], Therapist, sports, was at the bedside as a Ship broker Reviewed:   I have personally reviewed following labs and imaging studies:  Labs: Labs show the following:   Basic Metabolic Panel: Recent Labs  Lab 09/02/20 0459 09/03/20 0441 09/04/20 0745 09/05/20 0608 09/06/20 0614  NA 135 137 136 137 136  K 4.3 4.1 4.3 4.3 4.1  CL 101 103 102 102 102  CO2 21* 24 25 26 26   GLUCOSE 265* 173* 196* 156* 148*  BUN 27* 26* 21 18 18   CREATININE 1.33* 1.17* 1.05* 1.04* 1.04*  CALCIUM 9.3 8.7* 9.0 8.9 8.7*   GFR Estimated Creatinine Clearance: 78.6 mL/min (A) (by C-G formula based on SCr of 1.04 mg/dL (H)). Liver Function Tests: No results for input(s): AST, ALT, ALKPHOS, BILITOT, PROT, ALBUMIN in the last 168 hours. No results for input(s): LIPASE, AMYLASE in the last 168 hours. No results for input(s): AMMONIA in the last 168 hours. Coagulation profile No results for input(s): INR, PROTIME in the last 168 hours.  CBC: Recent Labs  Lab 09/02/20 0459 09/03/20 0441 09/04/20 0745 09/05/20 0608 09/06/20 0614 09/07/20 0032  WBC 3.8* 2.9* 2.4* 2.1* 1.7* 2.4*  NEUTROABS 2.8  --   --   --  0.8*  --   HGB 7.6* 7.3* 7.9* 7.7* 7.0* 7.2*  HCT 24.8* 23.8* 25.8* 25.2* 23.4* 24.2*  MCV 65.1* 65.0* 65.2* 65.3* 65.4* 64.9*  PLT 99* 88* 93* 89* 80* 90*   Cardiac Enzymes: No results for input(s): CKTOTAL, CKMB, CKMBINDEX, TROPONINI in the last 168 hours. BNP (last 3 results) No results for input(s): PROBNP in the last 8760 hours. CBG: Recent Labs  Lab 09/06/20 1233 09/06/20 1633 09/06/20 2039 09/07/20 0751 09/07/20 1215  GLUCAP 246* 209* 159* 156* 282*   D-Dimer: No results for input(s): DDIMER in the last 72 hours. Hgb A1c: No results for input(s): HGBA1C in the last 72 hours. Lipid  Profile: No results for input(s): CHOL, HDL, LDLCALC, TRIG, CHOLHDL, LDLDIRECT in the last 72 hours. Thyroid function studies: No results for input(s): TSH, T4TOTAL, T3FREE, THYROIDAB in the last 72 hours.  Invalid input(s): FREET3 Anemia  work up: No results for input(s): VITAMINB12, FOLATE, FERRITIN, TIBC, IRON, RETICCTPCT in the last 72 hours. Sepsis Labs: Recent Labs  Lab 09/04/20 0745 09/05/20 0608 09/06/20 0614 09/07/20 0032  WBC 2.4* 2.1* 1.7* 2.4*    Microbiology Recent Results (from the past 240 hour(s))  SARS CORONAVIRUS 2 (TAT 6-24 HRS) Nasopharyngeal Nasopharyngeal Swab     Status: Abnormal   Collection Time: 09/02/20  5:02 AM   Specimen: Nasopharyngeal Swab  Result Value Ref Range Status   SARS Coronavirus 2 POSITIVE (A) NEGATIVE Final    Comment: (NOTE) SARS-CoV-2 target nucleic acids are DETECTED.  The SARS-CoV-2 RNA is generally detectable in upper and lower respiratory specimens during the acute phase of infection. Positive results are indicative of the presence of SARS-CoV-2 RNA. Clinical correlation with patient history and other diagnostic information is  necessary to determine patient infection status. Positive results do not rule out bacterial infection or co-infection with other viruses.  The expected result is Negative.  Fact Sheet for Patients: SugarRoll.be  Fact Sheet for Healthcare Providers: https://www.woods-mathews.com/  This test is not yet approved or cleared by the [place] FDA and  has been authorized for detection and/or diagnosis of SARS-CoV-2 by FDA under an Emergency Use Authorization (EUA). This EUA will remain  in effect (meaning this test can be used) for the duration of the COVID-19 declaration under Section 564(b)(1) of the Act, 21 U. S.C. section 360bbb-3(b)(1), unless the authorization is terminated or revoked sooner.   Performed at [facility], [place] [address]  [place], [place] [address]     Procedures and diagnostic studies:  No results found.             LOS: 3 days   Sybol Morre  [facility]   Pager on [email or web address]. If 7PM-7AM, please contact night-coverage at [email or web address]     09/07/2020, 12:41 PM

## 2020-09-07 NOTE — Progress Notes (Signed)
Patient had  bloody stool x1, hospitalist notified, A hemoglobin ordered x1. Hemoglobin stable at this time, will continue to monitor.
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
## 2020-09-08 DIAGNOSIS — U071 COVID-19: Secondary | ICD-10-CM

## 2020-09-08 DIAGNOSIS — S72001D Fracture of unspecified part of neck of right femur, subsequent encounter for closed fracture with routine healing: Secondary | ICD-10-CM | POA: Diagnosis not present

## 2020-09-08 DIAGNOSIS — J1282 Pneumonia due to coronavirus disease 2019: Secondary | ICD-10-CM | POA: Clinically undetermined

## 2020-09-08 LAB — CBC WITH DIFFERENTIAL/PLATELET
Abs Immature Granulocytes: 0.01 10*3/uL (ref 0.00–0.07)
Basophils Absolute: 0 10*3/uL (ref 0.0–0.1)
Basophils Relative: 0 %
Eosinophils Absolute: 0 10*3/uL (ref 0.0–0.5)
Eosinophils Relative: 0 %
HCT: 24.5 % — ABNORMAL LOW (ref 36.0–46.0)
Hemoglobin: 7.7 g/dL — ABNORMAL LOW (ref 12.0–15.0)
Immature Granulocytes: 0 %
Lymphocytes Relative: 41 %
Lymphs Abs: 0.9 10*3/uL (ref 0.7–4.0)
MCH: 19.9 pg — ABNORMAL LOW (ref 26.0–34.0)
MCHC: 31.4 g/dL (ref 30.0–36.0)
MCV: 63.5 fL — ABNORMAL LOW (ref 80.0–100.0)
Monocytes Absolute: 0.2 10*3/uL (ref 0.1–1.0)
Monocytes Relative: 7 %
Neutro Abs: 1.2 10*3/uL — ABNORMAL LOW (ref 1.7–7.7)
Neutrophils Relative %: 52 %
Platelets: 103 10*3/uL — ABNORMAL LOW (ref 150–400)
RBC: 3.86 MIL/uL — ABNORMAL LOW (ref 3.87–5.11)
RDW: 14.6 % (ref 11.5–15.5)
Smear Review: NORMAL
WBC: 2.3 10*3/uL — ABNORMAL LOW (ref 4.0–10.5)
nRBC: 0 % (ref 0.0–0.2)

## 2020-09-08 LAB — GLUCOSE, CAPILLARY
Glucose-Capillary: 185 mg/dL — ABNORMAL HIGH (ref 70–99)
Glucose-Capillary: 198 mg/dL — ABNORMAL HIGH (ref 70–99)
Glucose-Capillary: 225 mg/dL — ABNORMAL HIGH (ref 70–99)
Glucose-Capillary: 255 mg/dL — ABNORMAL HIGH (ref 70–99)
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
## 2020-09-08 NOTE — Progress Notes (Addendum)
Progress Note    Rachel Huber  HER:[id number] DOB: 1955/09/29  DOA: 09/02/2020 PCP: [name], MD      Brief Narrative:    Medical records reviewed and are as summarized below:  Rachel Huber is a 65 y.o. female       Assessment/Plan:   Principal Problem:   Closed right hip fracture (HCC) Active Problems:   Type II diabetes mellitus with renal manifestations ([place])   Hypertension   Thalassemia minor   Pancytopenia (HCC)   HLD (hyperlipidemia)   Acute renal failure superimposed on stage 3a chronic kidney disease ([place])   Depression   Fall at home, initial encounter   Nondisplaced fracture of greater trochanter of right femur, initial encounter for closed fracture ([place])   Hip fracture, right ([place])   COVID-19 virus infection Rectal bleeding Hypotension  Body mass index is 37.74 kg/m.  Morbid obesity)    PLAN  H&H remained stable.  Hemoglobin is actually trending up.  No indication for blood transfusion.  No rectal bleeding today. Discontinue IV fluids.  Continue to hold amlodipine because of recent hypotension and GI bleed.  Creatinine is stable.  Continue NovoLog as needed for hyperglycemia.   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  Continue current home medications for comorbidities.  There is no indication for treatment for COVID-19 infection at this time.  She is asymptomatic.  Awaiting placement to SNF.  Diet Order            Diet heart healthy/carb modified Room service appropriate? Yes; Fluid consistency: Thin  Diet effective now                    Consultants:  Orthopedic surgeon  Procedures:  None    Medications:   . atorvastatin  40 mg Oral Daily  . gabapentin  800 mg Oral BID  . insulin aspart  0-5 Units Subcutaneous QHS  . insulin aspart  0-9 Units Subcutaneous TID WC  . sertraline  50 mg Oral Daily   Continuous Infusions:    Anti-infectives (From admission, onward)   None              Family Communication/Anticipated D/C date and plan/Code Status   DVT prophylaxis: Place and maintain sequential compression device Start: 09/04/20 1549 SCDs Start: 09/02/20 8563     Code Status: Full Code  Family Communication: None Disposition Plan:    Status is: Inpatient  Remains inpatient appropriate because:Unsafe d/c plan   Dispo: The patient is from: Home              Anticipated d/c is to: SNF              Anticipated d/c date is: 1 day              Patient currently is medically stable to d/c.           Subjective:   She complains of pain in the right hip.  No rectal bleeding since last episode yesterday morning.  Objective:    Vitals:   09/07/20 1951 09/08/20 0001 09/08/20 0549 09/08/20 0827  BP: (!) 119/50 (!) 123/59 (!) 112/49 (!) 117/56  Pulse: 89 88 86 88  Resp: 18 16 16 16   Temp: 100.3 F (37.9 C) 98.7 F (37.1 C) 98.4 F (36.9 C) 97.8 F (36.6 C)  TempSrc: Oral Oral Oral   SpO2: 90% 93% 92% 91%  Weight:      Height:  No data found.   Intake/Output Summary (Last 24 hours) at 09/08/2020 1141 Last data filed at 09/08/2020 7342 Gross per 24 hour  Intake --  Output 1150 ml  Net -1150 ml   Filed Weights   09/02/20 1517  Weight: 119.3 kg    Exam:  GEN: NAD SKIN: No rash EYES: EOMI ENT: MMM CV: RRR PULM: CTA B ABD: soft, obese, NT, +BS CNS: AAO x 3, non focal EXT: Right hip tenderness, no edema           Data Reviewed:   I have personally reviewed following labs and imaging studies:  Labs: Labs show the following:   Basic Metabolic Panel: Recent Labs  Lab 09/02/20 0459 09/03/20 0441 09/04/20 0745 09/05/20 0608 09/06/20 0614  NA 135 137 136 137 136  K 4.3 4.1 4.3 4.3 4.1  CL 101 103 102 102 102  CO2 21* 24 25 26 26   GLUCOSE 265* 173* 196* 156* 148*  BUN 27* 26* 21 18 18   CREATININE 1.33* 1.17* 1.05* 1.04* 1.04*  CALCIUM 9.3 8.7* 9.0 8.9 8.7*   GFR Estimated Creatinine  Clearance: 78.6 mL/min (A) (by C-G formula based on SCr of 1.04 mg/dL (H)). Liver Function Tests: No results for input(s): AST, ALT, ALKPHOS, BILITOT, PROT, ALBUMIN in the last 168 hours. No results for input(s): LIPASE, AMYLASE in the last 168 hours. No results for input(s): AMMONIA in the last 168 hours. Coagulation profile No results for input(s): INR, PROTIME in the last 168 hours.  CBC: Recent Labs  Lab 09/02/20 0459 09/03/20 0441 09/04/20 0745 09/05/20 0608 09/06/20 0614 09/07/20 0032 09/07/20 1454 09/08/20 0639  WBC 3.8*   < > 2.4* 2.1* 1.7* 2.4*  --  2.3*  NEUTROABS 2.8  --   --   --  0.8*  --   --  1.2*  HGB 7.6*   < > 7.9* 7.7* 7.0* 7.2* 7.4* 7.7*  HCT 24.8*   < > 25.8* 25.2* 23.4* 24.2* 24.5* 24.5*  MCV 65.1*   < > 65.2* 65.3* 65.4* 64.9*  --  63.5*  PLT 99*   < > 93* 89* 80* 90*  --  103*   < > = values in this interval not displayed.   Cardiac Enzymes: No results for input(s): CKTOTAL, CKMB, CKMBINDEX, TROPONINI in the last 168 hours. BNP (last 3 results) No results for input(s): PROBNP in the last 8760 hours. CBG: Recent Labs  Lab 09/07/20 0751 09/07/20 1215 09/07/20 1555 09/07/20 2056 09/08/20 0918  GLUCAP 156* 282* 237* 147* 185*   D-Dimer: No results for input(s): DDIMER in the last 72 hours. Hgb A1c: No results for input(s): HGBA1C in the last 72 hours. Lipid Profile: No results for input(s): CHOL, HDL, LDLCALC, TRIG, CHOLHDL, LDLDIRECT in the last 72 hours. Thyroid function studies: No results for input(s): TSH, T4TOTAL, T3FREE, THYROIDAB in the last 72 hours.  Invalid input(s): FREET3 Anemia work up: No results for input(s): VITAMINB12, FOLATE, FERRITIN, TIBC, IRON, RETICCTPCT in the last 72 hours. Sepsis Labs: Recent Labs  Lab 09/05/20 0608 09/06/20 0614 09/07/20 0032 09/08/20 0639  WBC 2.1* 1.7* 2.4* 2.3*    Microbiology Recent Results (from the past 240 hour(s))  SARS CORONAVIRUS 2 (TAT 6-24 HRS) Nasopharyngeal Nasopharyngeal  Swab     Status: Abnormal   Collection Time: 09/02/20  5:02 AM   Specimen: Nasopharyngeal Swab  Result Value Ref Range Status   SARS Coronavirus 2 POSITIVE (A) NEGATIVE Final    Comment: (NOTE) SARS-CoV-2 target nucleic acids  are DETECTED.  The SARS-CoV-2 RNA is generally detectable in upper and lower respiratory specimens during the acute phase of infection. Positive results are indicative of the presence of SARS-CoV-2 RNA. Clinical correlation with patient history and other diagnostic information is  necessary to determine patient infection status. Positive results do not rule out bacterial infection or co-infection with other viruses.  The expected result is Negative.  Fact Sheet for Patients: SugarRoll.be  Fact Sheet for Healthcare Providers: https://www.woods-mathews.com/  This test is not yet approved or cleared by the [place] FDA and  has been authorized for detection and/or diagnosis of SARS-CoV-2 by FDA under an Emergency Use Authorization (EUA). This EUA will remain  in effect (meaning this test can be used) for the duration of the COVID-19 declaration under Section 564(b)(1) of the Act, 21 U. S.C. section 360bbb-3(b)(1), unless the authorization is terminated or revoked sooner.   Performed at [facility], [place] [address], [place], [place] [address]     Procedures and diagnostic studies:  No results found.             LOS: 4 days   Tinley Rought  [facility]   Pager on [email or web address]. If 7PM-7AM, please contact night-coverage at [email or web address]     09/08/2020, 11:41 AM

## 2020-09-09 DIAGNOSIS — S72001D Fracture of unspecified part of neck of right femur, subsequent encounter for closed fracture with routine healing: Secondary | ICD-10-CM | POA: Diagnosis not present

## 2020-09-09 DIAGNOSIS — U071 COVID-19: Secondary | ICD-10-CM | POA: Diagnosis not present

## 2020-09-09 LAB — GLUCOSE, CAPILLARY
Glucose-Capillary: 174 mg/dL — ABNORMAL HIGH (ref 70–99)
Glucose-Capillary: 211 mg/dL — ABNORMAL HIGH (ref 70–99)
Glucose-Capillary: 171 mg/dL — ABNORMAL HIGH (ref 70–99)
Glucose-Capillary: 158 mg/dL — ABNORMAL HIGH (ref 70–99)

## 2020-09-09 MED ORDER — GUAIFENESIN-DM 100-10 MG/5ML PO SYRP
5.0000 mL | ORAL_SOLUTION | ORAL | Status: DC | PRN
  Filled 2020-09-09: qty 5

## 2020-09-09 NOTE — Plan of Care (Signed)
  Problem: Acute Rehab PT Goals(only PT should resolve) Goal: Pt Will Go Supine/Side To Sit Flowsheets (Taken 09/09/2020 1150) Pt will go Supine/Side to Sit: with modified independence Goal: Pt Will Transfer Bed To Chair/Chair To Bed Flowsheets (Taken 09/09/2020 1150) Pt will Transfer Bed to Chair/Chair to Bed: with modified independence Note: With LRAD Goal: Pt Will Ambulate Flowsheets (Taken 09/09/2020 1150) Pt will Ambulate:  > 125 feet  with modified independence  with least restrictive assistive device Goal: Pt Will Go Up/Down Stairs Flowsheets (Taken 09/09/2020 1150) Pt will Go Up / Down Stairs:  1-2 stairs  with modified independence  with least restrictive assistive device
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
## 2020-09-09 NOTE — Progress Notes (Addendum)
Progress Note    Rachel Huber  [id number] DOB: March 16, 1956  DOA: 09/02/2020 PCP: [name], MD      Brief Narrative:    Medical records reviewed and are as summarized below:  Rachel Huber is a 65 y.o. female       Assessment/Plan:   Principal Problem:   Closed right hip fracture (HCC) Active Problems:   Type II diabetes mellitus with renal manifestations ([place])   Hypertension   Thalassemia minor   Pancytopenia (HCC)   HLD (hyperlipidemia)   Acute renal failure superimposed on stage 3a chronic kidney disease ([place])   Depression   Fall at home, initial encounter   Nondisplaced fracture of greater trochanter of right femur, initial encounter for closed fracture ([place])   Hip fracture, right ([place])   COVID-19 virus infection Rectal bleeding Hypotension  Body mass index is 37.74 kg/m.  Morbid obesity)    PLAN  H&H is stable.  No indication for blood transfusion.    Amlodipine is still on hold because of recent hypotension and GI bleed.  Patient has mild   hypoxia with oxygen saturation of 88 to 89%.  She is on 2 L/min oxygen via nasal cannula.   Continue NovoLog as needed for hyperglycemia.   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  Continue current home medications for comorbidities.  There is no indication for treatment for COVID-19 infection at this time.   Awaiting placement to SNF.  Diet Order            Diet heart healthy/carb modified Room service appropriate? Yes; Fluid consistency: Thin  Diet effective now                    Consultants:  Orthopedic surgeon  Procedures:  None    Medications:   . atorvastatin  40 mg Oral Daily  . gabapentin  800 mg Oral BID  . insulin aspart  0-5 Units Subcutaneous QHS  . insulin aspart  0-9 Units Subcutaneous TID WC  . sertraline  50 mg Oral Daily   Continuous Infusions:    Anti-infectives (From admission, onward)   None             Family  Communication/Anticipated D/C date and plan/Code Status   DVT prophylaxis: Place and maintain sequential compression device Start: 09/04/20 1549 SCDs Start: 09/02/20 8921     Code Status: Full Code  Family Communication: None Disposition Plan:    Status is: Inpatient  Remains inpatient appropriate because:Unsafe d/c plan   Dispo: The patient is from: Home              Anticipated d/c is to: SNF              Anticipated d/c date is: 1 day              Patient currently is medically stable to d/c.           Subjective:   Interval events noted.  She said she had bloody stools yesterday evening.  She says she has been dealing with this intermittently for about 5 years.  She has some pain in the right hip.  No other complaints.  Objective:    Vitals:   09/09/20 0041 09/09/20 0123 09/09/20 0600 09/09/20 0805  BP: (!) 118/49  (!) 118/48 (!) 121/55  Pulse: 85 87 87 95  Resp: 18  (!) 21 18  Temp: 98.9 F (37.2 C)  99.2 F (  37.3 C) 100 F (37.8 C)  TempSrc: Oral     SpO2:  91% (!) 88% (!) 89%  Weight:      Height:       No data found.  No intake or output data in the 24 hours ending 09/09/20 1118 Filed Weights   09/02/20 1517  Weight: 119.3 kg    Exam:  GEN: NAD SKIN: No rash EYES: EOMI ENT: MMM CV: RRR PULM: CTA B ABD: soft, obese, NT, +BS CNS: AAO x 3, non focal EXT: Mild right hip tenderness         Data Reviewed:   I have personally reviewed following labs and imaging studies:  Labs: Labs show the following:   Basic Metabolic Panel: Recent Labs  Lab 09/03/20 0441 09/04/20 0745 09/05/20 0608 09/06/20 0614  NA 137 136 137 136  K 4.1 4.3 4.3 4.1  CL 103 102 102 102  CO2 24 25 26 26   GLUCOSE 173* 196* 156* 148*  BUN 26* 21 18 18   CREATININE 1.17* 1.05* 1.04* 1.04*  CALCIUM 8.7* 9.0 8.9 8.7*   GFR Estimated Creatinine Clearance: 78.6 mL/min (A) (by C-G formula based on SCr of 1.04 mg/dL (H)). Liver Function Tests: No  results for input(s): AST, ALT, ALKPHOS, BILITOT, PROT, ALBUMIN in the last 168 hours. No results for input(s): LIPASE, AMYLASE in the last 168 hours. No results for input(s): AMMONIA in the last 168 hours. Coagulation profile No results for input(s): INR, PROTIME in the last 168 hours.  CBC: Recent Labs  Lab 09/04/20 0745 09/05/20 0608 09/06/20 0614 09/07/20 0032 09/07/20 1454 09/08/20 0639  WBC 2.4* 2.1* 1.7* 2.4*  --  2.3*  NEUTROABS  --   --  0.8*  --   --  1.2*  HGB 7.9* 7.7* 7.0* 7.2* 7.4* 7.7*  HCT 25.8* 25.2* 23.4* 24.2* 24.5* 24.5*  MCV 65.2* 65.3* 65.4* 64.9*  --  63.5*  PLT 93* 89* 80* 90*  --  103*   Cardiac Enzymes: No results for input(s): CKTOTAL, CKMB, CKMBINDEX, TROPONINI in the last 168 hours. BNP (last 3 results) No results for input(s): PROBNP in the last 8760 hours. CBG: Recent Labs  Lab 09/08/20 0918 09/08/20 1250 09/08/20 1700 09/08/20 2029 09/09/20 0806  GLUCAP 185* 198* 225* 255* 174*   D-Dimer: No results for input(s): DDIMER in the last 72 hours. Hgb A1c: No results for input(s): HGBA1C in the last 72 hours. Lipid Profile: No results for input(s): CHOL, HDL, LDLCALC, TRIG, CHOLHDL, LDLDIRECT in the last 72 hours. Thyroid function studies: No results for input(s): TSH, T4TOTAL, T3FREE, THYROIDAB in the last 72 hours.  Invalid input(s): FREET3 Anemia work up: No results for input(s): VITAMINB12, FOLATE, FERRITIN, TIBC, IRON, RETICCTPCT in the last 72 hours. Sepsis Labs: Recent Labs  Lab 09/05/20 0608 09/06/20 0614 09/07/20 0032 09/08/20 0639  WBC 2.1* 1.7* 2.4* 2.3*    Microbiology Recent Results (from the past 240 hour(s))  SARS CORONAVIRUS 2 (TAT 6-24 HRS) Nasopharyngeal Nasopharyngeal Swab     Status: Abnormal   Collection Time: 09/02/20  5:02 AM   Specimen: Nasopharyngeal Swab  Result Value Ref Range Status   SARS Coronavirus 2 POSITIVE (A) NEGATIVE Final    Comment: (NOTE) SARS-CoV-2 target nucleic acids are  DETECTED.  The SARS-CoV-2 RNA is generally detectable in upper and lower respiratory specimens during the acute phase of infection. Positive results are indicative of the presence of SARS-CoV-2 RNA. Clinical correlation with patient history and other diagnostic information is  necessary  to determine patient infection status. Positive results do not rule out bacterial infection or co-infection with other viruses.  The expected result is Negative.  Fact Sheet for Patients: SugarRoll.be  Fact Sheet for Healthcare Providers: https://www.woods-mathews.com/  This test is not yet approved or cleared by the [place] FDA and  has been authorized for detection and/or diagnosis of SARS-CoV-2 by FDA under an Emergency Use Authorization (EUA). This EUA will remain  in effect (meaning this test can be used) for the duration of the COVID-19 declaration under Section 564(b)(1) of the Act, 21 U. S.C. section 360bbb-3(b)(1), unless the authorization is terminated or revoked sooner.   Performed at [facility], [place] [address] [place], [place] [address]     Procedures and diagnostic studies:  No results found.             LOS: 5 days   Shakora Nordquist  [facility]   Pager on [email or web address]. If 7PM-7AM, please contact night-coverage at [email or web address]     09/09/2020, 11:18 AM

## 2020-09-09 NOTE — Plan of Care (Signed)
Goals created as late entry from initial eval & apply since initial PT contact/evaluation.   Lavone Nian, PT, DPT 09/09/20, 11:55 AM  Problem: Acute Rehab PT Goals(only PT should resolve) Goal: Pt Will Go Supine/Side To Sit Flowsheets (Taken 09/09/2020 1150) Pt will go Supine/Side to Sit: with modified independence Goal: Pt Will Transfer Bed To Chair/Chair To Bed Flowsheets (Taken 09/09/2020 1150) Pt will Transfer Bed to Chair/Chair to Bed: with modified independence Note: With LRAD Goal: Pt Will Ambulate Flowsheets (Taken 09/09/2020 1150) Pt will Ambulate:  > 125 feet  with modified independence  with least restrictive assistive device Goal: Pt Will Go Up/Down Stairs Flowsheets (Taken 09/09/2020 1150) Pt will Go Up / Down Stairs:  1-2 stairs  with modified independence  with least restrictive assistive device

## 2020-09-10 ENCOUNTER — Inpatient Hospital Stay: Payer: Managed Care, Other (non HMO)

## 2020-09-10 DIAGNOSIS — U071 COVID-19: Secondary | ICD-10-CM | POA: Diagnosis not present

## 2020-09-10 DIAGNOSIS — J1282 Pneumonia due to coronavirus disease 2019: Secondary | ICD-10-CM | POA: Diagnosis not present

## 2020-09-10 DIAGNOSIS — J9601 Acute respiratory failure with hypoxia: Secondary | ICD-10-CM | POA: Diagnosis not present

## 2020-09-10 DIAGNOSIS — S72001D Fracture of unspecified part of neck of right femur, subsequent encounter for closed fracture with routine healing: Secondary | ICD-10-CM | POA: Diagnosis not present

## 2020-09-10 LAB — BASIC METABOLIC PANEL
Anion gap: 8 (ref 5–15)
BUN: 20 mg/dL (ref 8–23)
CO2: 25 mmol/L (ref 22–32)
Calcium: 8.7 mg/dL — ABNORMAL LOW (ref 8.9–10.3)
Chloride: 103 mmol/L (ref 98–111)
Creatinine, Ser: 0.96 mg/dL (ref 0.44–1.00)
GFR, Estimated: >60 mL/min (ref >60)
Glucose, Bld: 193 mg/dL — ABNORMAL HIGH (ref 70–99)
Potassium: 4.1 mmol/L (ref 3.5–5.1)
Sodium: 136 mmol/L (ref 135–145)

## 2020-09-10 LAB — CBC WITH DIFFERENTIAL/PLATELET
Abs Immature Granulocytes: 0.01 10*3/uL (ref 0.00–0.07)
Basophils Absolute: 0 10*3/uL (ref 0.0–0.1)
Basophils Relative: 0 %
Eosinophils Absolute: 0 10*3/uL (ref 0.0–0.5)
Eosinophils Relative: 0 %
HCT: 25.5 % — ABNORMAL LOW (ref 36.0–46.0)
Hemoglobin: 7.8 g/dL — ABNORMAL LOW (ref 12.0–15.0)
Immature Granulocytes: 0 %
Lymphocytes Relative: 29 %
Lymphs Abs: 0.7 10*3/uL (ref 0.7–4.0)
MCH: 19.7 pg — ABNORMAL LOW (ref 26.0–34.0)
MCHC: 30.6 g/dL (ref 30.0–36.0)
MCV: 64.4 fL — ABNORMAL LOW (ref 80.0–100.0)
Monocytes Absolute: 0.2 10*3/uL (ref 0.1–1.0)
Monocytes Relative: 9 %
Neutro Abs: 1.6 10*3/uL — ABNORMAL LOW (ref 1.7–7.7)
Neutrophils Relative %: 62 %
Platelets: 104 10*3/uL — ABNORMAL LOW (ref 150–400)
RBC: 3.96 MIL/uL (ref 3.87–5.11)
RDW: 14.9 % (ref 11.5–15.5)
Smear Review: NORMAL
WBC: 2.6 10*3/uL — ABNORMAL LOW (ref 4.0–10.5)
nRBC: 0 % (ref 0.0–0.2)

## 2020-09-10 LAB — FERRITIN: Ferritin: 130 ng/mL (ref 11–307)

## 2020-09-10 LAB — GLUCOSE, CAPILLARY
Glucose-Capillary: 194 mg/dL — ABNORMAL HIGH (ref 70–99)
Glucose-Capillary: 254 mg/dL — ABNORMAL HIGH (ref 70–99)
Glucose-Capillary: 343 mg/dL — ABNORMAL HIGH (ref 70–99)
Glucose-Capillary: 335 mg/dL — ABNORMAL HIGH (ref 70–99)

## 2020-09-10 LAB — C-REACTIVE PROTEIN: CRP: 3.2 mg/dL — ABNORMAL HIGH (ref <1.0)

## 2020-09-10 MED ORDER — INSULIN GLARGINE 100 UNIT/ML ~~LOC~~ SOLN
15.0000 [IU] | Freq: Every day | SUBCUTANEOUS | Status: DC
  Administered 2020-09-10 – 2020-09-11 (×2): 15 [IU] via SUBCUTANEOUS
  Filled 2020-09-10 (×2): qty 0.15

## 2020-09-10 MED ORDER — SODIUM CHLORIDE 0.9 % IV SOLN
200.0000 mg | Freq: Once | INTRAVENOUS | Status: AC
  Administered 2020-09-10: 200 mg via INTRAVENOUS
  Filled 2020-09-10: qty 200

## 2020-09-10 MED ORDER — METHYLPREDNISOLONE SODIUM SUCC 40 MG IJ SOLR
40.0000 mg | Freq: Two times a day (BID) | INTRAMUSCULAR | Status: DC
  Administered 2020-09-10 – 2020-09-13 (×7): 40 mg via INTRAVENOUS
  Filled 2020-09-10 (×7): qty 1

## 2020-09-10 MED ORDER — SODIUM CHLORIDE 0.9 % IV SOLN
100.0000 mg | Freq: Every day | INTRAVENOUS | Status: DC
  Administered 2020-09-11 – 2020-09-13 (×3): 100 mg via INTRAVENOUS
  Filled 2020-09-10 (×3): qty 20
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
## 2020-09-10 NOTE — Progress Notes (Signed)
Remdesivir - Pharmacy Brief Note   O:  CXR: Bilateral multifocal ground-glass opacities concerning for multifocal atypical/viral pneumonia such as COVID 19 pneumonia. SpO2: 92-96% on 3L [place]   A/P:  Remdesivir 200 mg IVPB once followed by 100 mg IVPB daily x 4 days.   Pernell Dupre, PharmD, BCPS Clinical Pharmacist 09/10/2020 9:31 AM

## 2020-09-10 NOTE — Progress Notes (Signed)
Progress Note    Rachel Huber  GQQ:[id number] DOB: 11/01/1955  DOA: 09/02/2020 PCP: [name], MD      Brief Narrative:    Medical records reviewed and are as summarized below:  Rachel Huber is a 65 y.o. female       Assessment/Plan:   Principal Problem:   Closed right hip fracture (HCC) Active Problems:   Type II diabetes mellitus with renal manifestations ([place])   Hypertension   Thalassemia minor   Pancytopenia (HCC)   HLD (hyperlipidemia)   Acute renal failure superimposed on stage 3a chronic kidney disease ([place])   Depression   Fall at home, initial encounter   Nondisplaced fracture of greater trochanter of right femur, initial encounter for closed fracture ([place])   Hip fracture, right ([place])   Pneumonia due to COVID-19 virus   Acute respiratory failure with hypoxia (HCC) Rectal bleeding/chronic GI bleed Hypotension  Body mass index is 37.74 kg/m.  Morbid obesity)    PLAN  H&H is stable.  No indication for blood transfusion.    Amlodipine is still on hold because of recent hypotension and GI bleed.  Chest x-ray today showed multifocal pneumonia from COVID-19 infection.  To start IV remdesivir and IV steroids.  Risks and benefits discussed and patient agreeable to treatment.  Continue 3 L/min oxygen and taper off oxygen as able.  Start Lantus 15 units daily for hyperglycemia since she has been started on steroids.  Continue NovoLog as needed for hyperglycemia.  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  Continue current home medications for comorbidities.   Diet Order            Diet heart healthy/carb modified Room service appropriate? Yes; Fluid consistency: Thin  Diet effective now                    Consultants:  Orthopedic surgeon  Procedures:  None    Medications:   . atorvastatin  40 mg Oral Daily  . gabapentin  800 mg Oral BID  . insulin aspart  0-5 Units Subcutaneous QHS  . insulin aspart  0-9 Units  Subcutaneous TID WC  . methylPREDNISolone (SOLU-MEDROL) injection  40 mg Intravenous Q12H  . sertraline  50 mg Oral Daily   Continuous Infusions: . remdesivir 200 mg in sodium chloride 0.9% 250 mL IVPB     Followed by  . [START ON 09/11/2020] remdesivir 100 mg in NS 100 mL       Anti-infectives (From admission, onward)   Start     Dose/Rate Route Frequency Ordered Stop   09/11/20 1000  remdesivir 100 mg in sodium chloride 0.9 % 100 mL IVPB       "Followed by" Linked Group Details   100 mg 200 mL/hr over 30 Minutes Intravenous Daily 09/10/20 0929 09/15/20 0959   09/10/20 1015  remdesivir 200 mg in sodium chloride 0.9% 250 mL IVPB       "Followed by" Linked Group Details   200 mg 580 mL/hr over 30 Minutes Intravenous Once 09/10/20 6712               Family Communication/Anticipated D/C date and plan/Code Status   DVT prophylaxis: Place and maintain sequential compression device Start: 09/04/20 1549 SCDs Start: 09/02/20 4580     Code Status: Full Code  Family Communication: None Disposition Plan:    Status is: Inpatient  Remains inpatient appropriate because:Unsafe d/c plan   Dispo: The patient is from: Home  Anticipated d/c is to: SNF              Anticipated d/c date is: 1 day              Patient currently is medically stable to d/c.           Subjective:   Interval events noted.  She had fever with T-max of 101.9 last night.  She is requiring 3 L/min oxygen via nasal cannula.  She has a cough.  No shortness of breath or chest pain.  Objective:    Vitals:   09/09/20 2120 09/10/20 0607 09/10/20 0802 09/10/20 1141  BP:  (!) 110/55 (!) 131/52 103/76  Pulse:  80 87 95  Resp:   20 20  Temp: 99.3 F (37.4 C) 97.7 F (36.5 C) 99.2 F (37.3 C) 98.6 F (37 C)  TempSrc: Oral Oral Oral   SpO2:  94% 94% 93%  Weight:      Height:       No data found.   Intake/Output Summary (Last 24 hours) at 09/10/2020 1235 Last data filed at  09/10/2020 0700 Gross per 24 hour  Intake --  Output 1350 ml  Net -1350 ml   Filed Weights   09/02/20 1517  Weight: 119.3 kg    Exam:  GEN: NAD SKIN: Warm and dry EYES: EOMI ENT: MMM CV: RRR PULM: CTA B ABD: soft, obese, NT, +BS CNS: AAO x 3, non focal EXT: Right hip tenderness         Data Reviewed:   I have personally reviewed following labs and imaging studies:  Labs: Labs show the following:   Basic Metabolic Panel: Recent Labs  Lab 09/04/20 0745 09/05/20 0608 09/06/20 0614 09/10/20 0827  NA 136 137 136 136  K 4.3 4.3 4.1 4.1  CL 102 102 102 103  CO2 25 26 26 25   GLUCOSE 196* 156* 148* 193*  BUN 21 18 18 20   CREATININE 1.05* 1.04* 1.04* 0.96  CALCIUM 9.0 8.9 8.7* 8.7*   GFR Estimated Creatinine Clearance: 85.2 mL/min (by C-G formula based on SCr of 0.96 mg/dL). Liver Function Tests: No results for input(s): AST, ALT, ALKPHOS, BILITOT, PROT, ALBUMIN in the last 168 hours. No results for input(s): LIPASE, AMYLASE in the last 168 hours. No results for input(s): AMMONIA in the last 168 hours. Coagulation profile No results for input(s): INR, PROTIME in the last 168 hours.  CBC: Recent Labs  Lab 09/05/20 0608 09/06/20 0614 09/07/20 0032 09/07/20 1454 09/08/20 0639 09/10/20 0827  WBC 2.1* 1.7* 2.4*  --  2.3* 2.6*  NEUTROABS  --  0.8*  --   --  1.2* 1.6*  HGB 7.7* 7.0* 7.2* 7.4* 7.7* 7.8*  HCT 25.2* 23.4* 24.2* 24.5* 24.5* 25.5*  MCV 65.3* 65.4* 64.9*  --  63.5* 64.4*  PLT 89* 80* 90*  --  103* 104*   Cardiac Enzymes: No results for input(s): CKTOTAL, CKMB, CKMBINDEX, TROPONINI in the last 168 hours. BNP (last 3 results) No results for input(s): PROBNP in the last 8760 hours. CBG: Recent Labs  Lab 09/09/20 1247 09/09/20 1728 09/09/20 2004 09/10/20 0826 09/10/20 1221  GLUCAP 211* 171* 158* 194* 254*   D-Dimer: No results for input(s): DDIMER in the last 72 hours. Hgb A1c: No results for input(s): HGBA1C in the last 72  hours. Lipid Profile: No results for input(s): CHOL, HDL, LDLCALC, TRIG, CHOLHDL, LDLDIRECT in the last 72 hours. Thyroid function studies: No results for input(s): TSH, T4TOTAL, T3FREE,  THYROIDAB in the last 72 hours.  Invalid input(s): FREET3 Anemia work up: No results for input(s): VITAMINB12, FOLATE, FERRITIN, TIBC, IRON, RETICCTPCT in the last 72 hours. Sepsis Labs: Recent Labs  Lab 09/06/20 0614 09/07/20 0032 09/08/20 0639 09/10/20 0827  WBC 1.7* 2.4* 2.3* 2.6*    Microbiology Recent Results (from the past 240 hour(s))  SARS CORONAVIRUS 2 (TAT 6-24 HRS) Nasopharyngeal Nasopharyngeal Swab     Status: Abnormal   Collection Time: 09/02/20  5:02 AM   Specimen: Nasopharyngeal Swab  Result Value Ref Range Status   SARS Coronavirus 2 POSITIVE (A) NEGATIVE Final    Comment: (NOTE) SARS-CoV-2 target nucleic acids are DETECTED.  The SARS-CoV-2 RNA is generally detectable in upper and lower respiratory specimens during the acute phase of infection. Positive results are indicative of the presence of SARS-CoV-2 RNA. Clinical correlation with patient history and other diagnostic information is  necessary to determine patient infection status. Positive results do not rule out bacterial infection or co-infection with other viruses.  The expected result is Negative.  Fact Sheet for Patients: SugarRoll.be  Fact Sheet for Healthcare Providers: https://www.woods-mathews.com/  This test is not yet approved or cleared by the [place] FDA and  has been authorized for detection and/or diagnosis of SARS-CoV-2 by FDA under an Emergency Use Authorization (EUA). This EUA will remain  in effect (meaning this test can be used) for the duration of the COVID-19 declaration under Section 564(b)(1) of the Act, 21 U. S.C. section 360bbb-3(b)(1), unless the authorization is terminated or revoked sooner.   Performed at [facility], [place]  [address], [place], [place] [address]     Procedures and diagnostic studies:  DG Chest Port 1 View  Result Date: 09/10/2020 CLINICAL DATA:  Fever, patient with COVID EXAM: PORTABLE CHEST 1 VIEW COMPARISON:  Chest radiograph September 21, 2017 and chest CT September 21, 2017. FINDINGS: The heart size and mediastinal contours are within normal limits. Bilateral multifocal ground-glass opacities. No pleural effusion. No pneumothorax. Partially visualized right shoulder arthroplasty. IMPRESSION: Bilateral multifocal ground-glass opacities concerning for multifocal atypical/viral pneumonia such as COVID 19 pneumonia. Electronically Signed   By: Dahlia Bailiff MD   On: 09/10/2020 08:09               LOS: 6 days   [facility]   Pager on [email or web address]. If 7PM-7AM, please contact night-coverage at [email or web address]     09/10/2020, 12:35 PM

## 2020-09-10 NOTE — Progress Notes (Signed)
Inpatient Diabetes Program Recommendations  AACE/ADA: New Consensus Statement on Inpatient Glycemic Control   Target Ranges:  Prepandial:   less than 140 mg/dL      Peak postprandial:   less than 180 mg/dL (1-2 hours)      Critically ill patients:  140 - 180 mg/dL   Results for [facility], [place] (MRN [id number]) as of 09/10/2020 11:38  Ref. Range 09/09/2020 08:06 09/09/2020 12:47 09/09/2020 17:28 09/09/2020 20:04 09/10/2020 08:26  Glucose-Capillary Latest Ref Range: 70 - 99 mg/dL 174 (H) 211 (H) 171 (H) 158 (H) 194 (H)   Review of Glycemic Control  Diabetes history: DM2 Outpatient Diabetes medications: Amaryl 2 mg daily, Metformin XR 4801 mg BID, Trulicity 4.5 mg Qweek Current orders for Inpatient glycemic control: Novolog 0-9 units TID with meals, Novolog 0-5 units QHS; Solumedrol 40 mg Q12H  Inpatient Diabetes Program Recommendations:    Insulin: If steroids are continued, please consider ordering Novolog 3 units TID with meals for meal coverage if patient eats at least 50% of meals.  Thanks, [name] Alderman, RN, MSN, CDE Diabetes Coordinator Inpatient Diabetes Program [phone] (Team Pager from 8am to 5pm)
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
## 2020-09-11 DIAGNOSIS — J9601 Acute respiratory failure with hypoxia: Secondary | ICD-10-CM | POA: Diagnosis not present

## 2020-09-11 DIAGNOSIS — S72001D Fracture of unspecified part of neck of right femur, subsequent encounter for closed fracture with routine healing: Secondary | ICD-10-CM | POA: Diagnosis not present

## 2020-09-11 DIAGNOSIS — U071 COVID-19: Secondary | ICD-10-CM | POA: Diagnosis not present

## 2020-09-11 DIAGNOSIS — J1282 Pneumonia due to coronavirus disease 2019: Secondary | ICD-10-CM | POA: Diagnosis not present

## 2020-09-11 LAB — FIBRIN DERIVATIVES D-DIMER (ARMC ONLY): Fibrin derivatives D-dimer (ARMC): 2148.75 ng/mL (FEU) — ABNORMAL HIGH (ref 0.00–499.00)

## 2020-09-11 LAB — MAGNESIUM: Magnesium: 2 mg/dL (ref 1.7–2.4)

## 2020-09-11 LAB — C-REACTIVE PROTEIN: CRP: 2.8 mg/dL — ABNORMAL HIGH (ref <1.0)

## 2020-09-11 LAB — COMPREHENSIVE METABOLIC PANEL
ALT: 23 U/L (ref 0–44)
AST: 39 U/L (ref 15–41)
Albumin: 3.3 g/dL — ABNORMAL LOW (ref 3.5–5.0)
Alkaline Phosphatase: 76 U/L (ref 38–126)
Anion gap: 11 (ref 5–15)
BUN: 29 mg/dL — ABNORMAL HIGH (ref 8–23)
CO2: 23 mmol/L (ref 22–32)
Calcium: 9.2 mg/dL (ref 8.9–10.3)
Chloride: 102 mmol/L (ref 98–111)
Creatinine, Ser: 0.97 mg/dL (ref 0.44–1.00)
GFR, Estimated: >60 mL/min (ref >60)
Glucose, Bld: 337 mg/dL — ABNORMAL HIGH (ref 70–99)
Potassium: 4.5 mmol/L (ref 3.5–5.1)
Sodium: 136 mmol/L (ref 135–145)
Total Bilirubin: 1.7 mg/dL — ABNORMAL HIGH (ref 0.3–1.2)
Total Protein: 6.8 g/dL (ref 6.5–8.1)

## 2020-09-11 LAB — GLUCOSE, CAPILLARY
Glucose-Capillary: 354 mg/dL — ABNORMAL HIGH (ref 70–99)
Glucose-Capillary: 368 mg/dL — ABNORMAL HIGH (ref 70–99)
Glucose-Capillary: 404 mg/dL — ABNORMAL HIGH (ref 70–99)
Glucose-Capillary: 328 mg/dL — ABNORMAL HIGH (ref 70–99)

## 2020-09-11 LAB — FERRITIN: Ferritin: 158 ng/mL (ref 11–307)

## 2020-09-11 LAB — PHOSPHORUS: Phosphorus: 3.5 mg/dL (ref 2.5–4.6)

## 2020-09-11 MED ORDER — INSULIN GLARGINE 100 UNIT/ML ~~LOC~~ SOLN
20.0000 [IU] | Freq: Every day | SUBCUTANEOUS | Status: DC
  Filled 2020-09-11: qty 0.2

## 2020-09-11 MED ORDER — ENOXAPARIN SODIUM 60 MG/0.6ML ~~LOC~~ SOLN
0.5000 mg/kg | Freq: Every day | SUBCUTANEOUS | Status: DC
  Administered 2020-09-11 – 2020-09-12 (×2): 60 mg via SUBCUTANEOUS
  Filled 2020-09-11 (×2): qty 0.6

## 2020-09-11 MED ORDER — INSULIN ASPART 100 UNIT/ML ~~LOC~~ SOLN
5.0000 [IU] | Freq: Three times a day (TID) | SUBCUTANEOUS | Status: DC
  Administered 2020-09-11 – 2020-09-12 (×2): 5 [IU] via SUBCUTANEOUS
  Filled 2020-09-11: qty 1

## 2020-09-11 MED ORDER — INSULIN ASPART 100 UNIT/ML ~~LOC~~ SOLN
0.0000 [IU] | Freq: Three times a day (TID) | SUBCUTANEOUS | Status: DC
  Administered 2020-09-11: 17:00:00 20 [IU] via SUBCUTANEOUS
  Administered 2020-09-12: 11 [IU] via SUBCUTANEOUS
  Administered 2020-09-12: 20 [IU] via SUBCUTANEOUS
  Administered 2020-09-12: 18:00:00 15 [IU] via SUBCUTANEOUS
  Administered 2020-09-13 (×2): 7 [IU] via SUBCUTANEOUS
  Filled 2020-09-11 (×6): qty 1

## 2020-09-11 MED ORDER — INSULIN ASPART 100 UNIT/ML ~~LOC~~ SOLN
0.0000 [IU] | Freq: Every day | SUBCUTANEOUS | Status: DC
  Administered 2020-09-11 – 2020-09-12 (×2): 4 [IU] via SUBCUTANEOUS
  Filled 2020-09-11 (×2): qty 1
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
## 2020-09-11 NOTE — TOC Progression Note (Addendum)
Transition of Care [facility]) - Progression Note    Patient Details  Name: Rachel Huber MRN: [id number] Date of Birth: [birth year]  Transition of Care [facility]) CM/SW [place], LCSW Phone Number: 09/11/2020, 8:44 AM  Clinical Narrative:   Still no offers for COVID bed. Patient was COVID positive on 1/16. Tomorrow will be 10 days post positive test, CSW can start looking for regular SNF once patient is symptom free. Per MD patient is still symptomatic at this time.  12:35- Asked Ebony Hail to review patient for possible COVID SNF bed at [facility].    Expected Discharge Plan: [place] Barriers to Discharge: Continued Medical Work up  Expected Discharge Plan and Services Expected Discharge Plan: [place] In-house Referral: Clinical Social Work   Post Acute Care Choice: [place] Living arrangements for the past 2 months: Single Family Home                                       Social Determinants of Health (SDOH) Interventions    Readmission Risk Interventions No flowsheet data found.

## 2020-09-11 NOTE — Progress Notes (Signed)
Inpatient Diabetes Program Recommendations  AACE/ADA: New Consensus Statement on Inpatient Glycemic Control   Target Ranges:  Prepandial:   less than 140 mg/dL      Peak postprandial:   less than 180 mg/dL (1-2 hours)      Critically ill patients:  140 - 180 mg/dL   Results for [facility], Rachel Huber (MRN [id number]) as of 09/11/2020 09:32  Ref. Range 09/10/2020 08:26 09/10/2020 12:21 09/10/2020 16:40 09/10/2020 21:48 09/11/2020 08:46  Glucose-Capillary Latest Ref Range: 70 - 99 mg/dL 194 (H) 254 (H) 343 (H) 335 (H) 354 (H)   Review of Glycemic Control  Diabetes history: DM2 Outpatient Diabetes medications: Amaryl 2 mg daily, Metformin XR 2831 mg BID, Trulicity 4.5 mg Qweek Current orders for Inpatient glycemic control: Lantus 15 units daily, Novolog 0-9 units TID with meals, Novolog 0-5 units QHS; Solumedrol 40 mg Q12H  Inpatient Diabetes Program Recommendations:    Insulin: If steroids are continued, please consider increasing Lantus to 20 units daily and ordering Novolog 5 units TID with meals for meal coverage if patient eats at least 50% of meals.  Thanks, [name] Alderman, RN, MSN, CDE Diabetes Coordinator Inpatient Diabetes Program [phone] (Team Pager from 8am to 5pm)
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
## 2020-09-11 NOTE — Progress Notes (Signed)
PHARMACIST - PHYSICIAN COMMUNICATION  CONCERNING:  Enoxaparin (Lovenox) for DVT Prophylaxis    RECOMMENDATION: Patient was prescribed enoxaprin 45m q24 hours for VTE prophylaxis.   Filed Weights   09/02/20 1517  Weight: 119.3 kg (263 lb)    Body mass index is 37.74 kg/m.  Estimated Creatinine Clearance: 84.3 mL/min (by C-G formula based on SCr of 0.97 mg/dL).   Based on CPecospatient is candidate for enoxaparin 0.532mkg TBW SQ every 24 hours based on BMI being >30.  DESCRIPTION: [facility]  Patient is now receiving enoxaparin 60 mg every 24 hours    SuBerta MinorPharmD Clinical Pharmacist  09/11/2020 12:37 PM

## 2020-09-12 DIAGNOSIS — S72001D Fracture of unspecified part of neck of right femur, subsequent encounter for closed fracture with routine healing: Secondary | ICD-10-CM | POA: Diagnosis not present

## 2020-09-12 DIAGNOSIS — J9601 Acute respiratory failure with hypoxia: Secondary | ICD-10-CM | POA: Diagnosis not present

## 2020-09-12 DIAGNOSIS — D61818 Other pancytopenia: Secondary | ICD-10-CM | POA: Diagnosis not present

## 2020-09-12 DIAGNOSIS — I10 Essential (primary) hypertension: Secondary | ICD-10-CM | POA: Diagnosis not present

## 2020-09-12 LAB — COMPREHENSIVE METABOLIC PANEL
ALT: 24 U/L (ref 0–44)
AST: 35 U/L (ref 15–41)
Albumin: 3.8 g/dL (ref 3.5–5.0)
Alkaline Phosphatase: 84 U/L (ref 38–126)
Anion gap: 13 (ref 5–15)
BUN: 42 mg/dL — ABNORMAL HIGH (ref 8–23)
CO2: 21 mmol/L — ABNORMAL LOW (ref 22–32)
Calcium: 9.6 mg/dL (ref 8.9–10.3)
Chloride: 100 mmol/L (ref 98–111)
Creatinine, Ser: 1.01 mg/dL — ABNORMAL HIGH (ref 0.44–1.00)
GFR, Estimated: >60 mL/min (ref >60)
Glucose, Bld: 339 mg/dL — ABNORMAL HIGH (ref 70–99)
Potassium: 4.3 mmol/L (ref 3.5–5.1)
Sodium: 134 mmol/L — ABNORMAL LOW (ref 135–145)
Total Bilirubin: 2.3 mg/dL — ABNORMAL HIGH (ref 0.3–1.2)
Total Protein: 7.3 g/dL (ref 6.5–8.1)

## 2020-09-12 LAB — CBC
HCT: 26.9 % — ABNORMAL LOW (ref 36.0–46.0)
Hemoglobin: 8.3 g/dL — ABNORMAL LOW (ref 12.0–15.0)
MCH: 19.3 pg — ABNORMAL LOW (ref 26.0–34.0)
MCHC: 30.9 g/dL (ref 30.0–36.0)
MCV: 62.7 fL — ABNORMAL LOW (ref 80.0–100.0)
Platelets: 167 10*3/uL (ref 150–400)
RBC: 4.29 MIL/uL (ref 3.87–5.11)
RDW: 14.9 % (ref 11.5–15.5)
WBC: 4.1 10*3/uL (ref 4.0–10.5)
nRBC: 0 % (ref 0.0–0.2)

## 2020-09-12 LAB — GLUCOSE, CAPILLARY
Glucose-Capillary: 297 mg/dL — ABNORMAL HIGH (ref 70–99)
Glucose-Capillary: 356 mg/dL — ABNORMAL HIGH (ref 70–99)
Glucose-Capillary: 325 mg/dL — ABNORMAL HIGH (ref 70–99)
Glucose-Capillary: 300 mg/dL — ABNORMAL HIGH (ref 70–99)

## 2020-09-12 LAB — C-REACTIVE PROTEIN: CRP: 1.3 mg/dL — ABNORMAL HIGH (ref <1.0)

## 2020-09-12 LAB — FIBRIN DERIVATIVES D-DIMER (ARMC ONLY): Fibrin derivatives D-dimer (ARMC): 1535.95 ng/mL (FEU) — ABNORMAL HIGH (ref 0.00–499.00)

## 2020-09-12 LAB — FERRITIN: Ferritin: 166 ng/mL (ref 11–307)

## 2020-09-12 MED ORDER — INSULIN GLARGINE 100 UNIT/ML ~~LOC~~ SOLN
25.0000 [IU] | Freq: Every day | SUBCUTANEOUS | Status: DC
  Administered 2020-09-12 – 2020-09-13 (×2): 25 [IU] via SUBCUTANEOUS
  Filled 2020-09-12 (×2): qty 0.25

## 2020-09-12 MED ORDER — INSULIN ASPART 100 UNIT/ML ~~LOC~~ SOLN
7.0000 [IU] | Freq: Three times a day (TID) | SUBCUTANEOUS | Status: DC
  Administered 2020-09-12 – 2020-09-13 (×4): 7 [IU] via SUBCUTANEOUS
  Filled 2020-09-12 (×4): qty 1

## 2020-09-12 MED ORDER — CALCIUM CARBONATE ANTACID 500 MG PO CHEW
1.0000 | CHEWABLE_TABLET | Freq: Three times a day (TID) | ORAL | Status: DC | PRN
  Administered 2020-09-12: 200 mg via ORAL
  Filled 2020-09-12: qty 1
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
## 2020-09-12 NOTE — TOC Progression Note (Signed)
Transition of Care [facility]) - Progression Note    Patient Details  Name: Rachel Huber MRN: [id number] Date of Birth: [birth year]  [facility]) CM/SW Powell, LCSW Phone Number: 09/12/2020, 1:49 PM  Clinical Narrative:         Spoke with patient. Patient reported now she wants to DC home with her husband and go to [facility] in [place]. Patient reported she prefers this over [facility], but understands it depends on their policy with COVID. CSW left VM for [facility] in [place], requested a return call regarding their COVID guidelines. Patient reported she has a CPAP at home. Patient agreeable to 3 in 1 being ordered for home as recommended. Referral to [place] and [facility] and asked MD for order. Patient reported her husband will pick her up at time of DC. Possible DC tomorrow per MD. CSW will continue to follow.    Expected Discharge Plan: [place] Barriers to Discharge: Continued Medical Work up  Expected Discharge Plan and Services Expected Discharge Plan: [place] In-house Referral: Clinical Social Work   Post Acute Care Choice: [place] Living arrangements for the past 2 months: Single Family Home                                       Social Determinants of Health (SDOH) Interventions    Readmission Risk Interventions No flowsheet data found.
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
## 2020-09-12 NOTE — Progress Notes (Signed)
PT Cancellation Note  Patient Details Name: Rachel Huber MRN: [id number] DOB: March 13, 1956   Cancelled Treatment:     PT attempt. Pt currently working with OT. Will return later this afternoon .   Willette Pa 09/12/2020, 2:54 PM

## 2020-09-13 LAB — CBC WITH DIFFERENTIAL/PLATELET
Abs Immature Granulocytes: 0.01 10*3/uL (ref 0.00–0.07)
Basophils Absolute: 0 10*3/uL (ref 0.0–0.1)
Basophils Relative: 0 %
Eosinophils Absolute: 0 10*3/uL (ref 0.0–0.5)
Eosinophils Relative: 0 %
HCT: 24.6 % — ABNORMAL LOW (ref 36.0–46.0)
Hemoglobin: 7.5 g/dL — ABNORMAL LOW (ref 12.0–15.0)
Immature Granulocytes: 0 %
Lymphocytes Relative: 11 %
Lymphs Abs: 0.3 10*3/uL — ABNORMAL LOW (ref 0.7–4.0)
MCH: 19.2 pg — ABNORMAL LOW (ref 26.0–34.0)
MCHC: 30.5 g/dL (ref 30.0–36.0)
MCV: 63.1 fL — ABNORMAL LOW (ref 80.0–100.0)
Monocytes Absolute: 0.2 10*3/uL (ref 0.1–1.0)
Monocytes Relative: 6 %
Neutro Abs: 2.5 10*3/uL (ref 1.7–7.7)
Neutrophils Relative %: 83 %
Platelets: 143 10*3/uL — ABNORMAL LOW (ref 150–400)
RBC: 3.9 MIL/uL (ref 3.87–5.11)
RDW: 15.1 % (ref 11.5–15.5)
Smear Review: NORMAL
WBC: 2.9 10*3/uL — ABNORMAL LOW (ref 4.0–10.5)
nRBC: 0.7 % — ABNORMAL HIGH (ref 0.0–0.2)

## 2020-09-13 LAB — COMPREHENSIVE METABOLIC PANEL
ALT: 20 U/L (ref 0–44)
AST: 28 U/L (ref 15–41)
Albumin: 3.4 g/dL — ABNORMAL LOW (ref 3.5–5.0)
Alkaline Phosphatase: 75 U/L (ref 38–126)
Anion gap: 12 (ref 5–15)
BUN: 41 mg/dL — ABNORMAL HIGH (ref 8–23)
CO2: 22 mmol/L (ref 22–32)
Calcium: 9.4 mg/dL (ref 8.9–10.3)
Chloride: 103 mmol/L (ref 98–111)
Creatinine, Ser: 0.98 mg/dL (ref 0.44–1.00)
GFR, Estimated: >60 mL/min (ref >60)
Glucose, Bld: 277 mg/dL — ABNORMAL HIGH (ref 70–99)
Potassium: 4.2 mmol/L (ref 3.5–5.1)
Sodium: 137 mmol/L (ref 135–145)
Total Bilirubin: 2.2 mg/dL — ABNORMAL HIGH (ref 0.3–1.2)
Total Protein: 6.9 g/dL (ref 6.5–8.1)

## 2020-09-13 LAB — FIBRIN DERIVATIVES D-DIMER (ARMC ONLY): Fibrin derivatives D-dimer (ARMC): 1105.05 ng/mL (FEU) — ABNORMAL HIGH (ref 0.00–499.00)

## 2020-09-13 LAB — MAGNESIUM: Magnesium: 1.9 mg/dL (ref 1.7–2.4)

## 2020-09-13 LAB — GLUCOSE, CAPILLARY
Glucose-Capillary: 240 mg/dL — ABNORMAL HIGH (ref 70–99)
Glucose-Capillary: 230 mg/dL — ABNORMAL HIGH (ref 70–99)

## 2020-09-13 LAB — C-REACTIVE PROTEIN: CRP: 0.8 mg/dL (ref <1.0)

## 2020-09-13 MED ORDER — METHOCARBAMOL 500 MG PO TABS: 500.0000 mg | ORAL_TABLET | Freq: Three times a day (TID) | ORAL | 0 refills | Status: DC | PRN

## 2020-09-13 MED ORDER — GUAIFENESIN-DM 100-10 MG/5ML PO SYRP: 5.0000 mL | ORAL_SOLUTION | ORAL | 0 refills | Status: DC | PRN

## 2020-09-13 MED ORDER — DEXAMETHASONE 6 MG PO TABS: 6.0000 mg | ORAL_TABLET | Freq: Every day | ORAL | 0 refills | Status: DC

## 2020-09-13 MED ORDER — OXYCODONE-ACETAMINOPHEN 5-325 MG PO TABS: 1.0000 | ORAL_TABLET | Freq: Four times a day (QID) | ORAL | 0 refills | Status: DC | PRN

## 2020-09-13 MED ORDER — SENNOSIDES-DOCUSATE SODIUM 8.6-50 MG PO TABS: 1.0000 | ORAL_TABLET | Freq: Every day | ORAL | 0 refills | Status: DC

## 2020-09-13 MED ORDER — POLYETHYLENE GLYCOL 3350 17 G PO PACK: 17.0000 g | PACK | Freq: Every day | ORAL | 0 refills | Status: DC | PRN

## 2020-09-13 NOTE — Discharge Instructions (Signed)
10 Things You Can Do to Manage Your COVID-19 Symptoms at Home If you have possible or confirmed COVID-19: 1. Stay home except to get medical care. 2. Monitor your symptoms carefully. If your symptoms get worse, call your healthcare provider immediately. 3. Get rest and stay hydrated. 4. If you have a medical appointment, call the healthcare provider ahead of time and tell them that you have or may have COVID-19. 5. For medical emergencies, call 911 and notify the dispatch personnel that you have or may have COVID-19. 6. Cover your cough and sneezes with a tissue or use the inside of your elbow. 7. Wash your hands often with soap and water for at least 20 seconds or clean your hands with an alcohol-based hand sanitizer that contains at least 60% alcohol. 8. As much as possible, stay in a specific room and away from other people in your home. Also, you should use a separate bathroom, if available. If you need to be around other people in or outside of the home, wear a mask. 9. Avoid sharing personal items with other people in your household, like dishes, towels, and bedding. 10. Clean all surfaces that are touched often, like counters, tabletops, and doorknobs. Use household cleaning sprays or wipes according to the label instructions. cdc.gov/coronavirus 03/02/2020 This information is not intended to replace advice given to you by your health care provider. Make sure you discuss any questions you have with your health care provider. Document Revised: 06/18/2020 Document Reviewed: 06/18/2020 Elsevier Patient Education  2021 Elsevier Inc.  

## 2020-09-13 NOTE — TOC Transition Note (Signed)
Transition of Care [facility]) - CM/SW Discharge Note   Patient Details  Name: Rachel Huber MRN: [id number] Date of Birth: 1955/12/26  Transition of Care [facility] CM/SW Contact:  Magnus Ivan, LCSW Phone Number: 09/13/2020, 11:56 AM   Clinical Narrative:   Patient has orders to discharge home today. CSW confirmed with [facility] Linna Hoff) Representative Haskell Flirt they can see patient 10 days after positive COVID test (patient is past this). CSW will fax referral to [facility] in [place]. Spoke to patient via phone. She confirms 3 in 1 has been delivered to bedside and her husband will pick her up at DC. No other needs identified prior to DC.    Final next level of care: OP Rehab Barriers to Discharge: Barriers Resolved   Patient Goals and CMS Choice Patient states their goals for this hospitalization and ongoing recovery are:: OP rehab CMS Medicare.gov Compare Post Acute Care list provided to:: Patient Choice offered to / list presented to : Patient  Discharge Placement                Patient to be transferred to facility by: spouse Name of family member notified: patient aware. Patient and family notified of of transfer: 09/13/20  Discharge Plan and Services In-house Referral: Clinical Social Work   Post Acute Care Choice: [place]          DME Arranged: 3-N-1 DME Agency: [employer] Date DME Agency Contacted: 09/12/20   Representative spoke with at DME Agency: Nash Shearer            Social Determinants of Health (SDOH) Interventions     Readmission Risk Interventions No flowsheet data found.

## 2020-09-13 NOTE — Progress Notes (Signed)
Inpatient Diabetes Program Recommendations  AACE/ADA: New Consensus Statement on Inpatient Glycemic Control   Target Ranges:  Prepandial:   less than 140 mg/dL      Peak postprandial:   less than 180 mg/dL (1-2 hours)      Critically ill patients:  140 - 180 mg/dL  Results for [facility], Tammie A (MRN [id number]) as of 09/13/2020 08:46  Ref. Range 09/13/2020 06:25  Glucose Latest Ref Range: 70 - 99 mg/dL 277 (H)   Results for [facility], Vianney A (MRN [id number]) as of 09/13/2020 08:46  Ref. Range 09/12/2020 08:22 09/12/2020 12:39 09/12/2020 17:05 09/12/2020 21:15  Glucose-Capillary Latest Ref Range: 70 - 99 mg/dL 297 (H) 356 (H) 325 (H) 300 (H)   Review of Glycemic Control Diabetes history:DM2 Outpatient Diabetes medications:Amaryl 2 mg daily, Metformin XR 0932 mg BID, Trulicity 4.5 mg Qweek Current orders for Inpatient glycemic control:Lantus 25 units daily, Novolog 0-20 units TID with meals, Novolog 0-5 units QHS, Novolog 7 units TID with meals; Solumedrol 40 mg Q12H  Inpatient Diabetes Program Recommendations:  Insulin:If steroids are continued as ordered, please consider increasing Lantus to 30 units daily and meal coverage to Novolog 12 units TID with meals if patient eats at least 50% of meals.  Thanks, [name] Alderman, RN, MSN, CDE Diabetes Coordinator Inpatient Diabetes Program [phone] (Team Pager from 8am to 5pm)
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
## 2020-09-25 ENCOUNTER — Ambulatory Visit (HOSPITAL_COMMUNITY): Payer: Managed Care, Other (non HMO) | Attending: Internal Medicine

## 2020-09-25 ENCOUNTER — Other Ambulatory Visit: Payer: Self-pay

## 2020-09-25 DIAGNOSIS — R262 Difficulty in walking, not elsewhere classified: Secondary | ICD-10-CM

## 2020-09-25 DIAGNOSIS — M6281 Muscle weakness (generalized): Secondary | ICD-10-CM | POA: Insufficient documentation
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
## 2020-09-25 NOTE — Patient Instructions (Signed)
Access Code: [email or web address] URL: [email or web address] Date: 09/25/2020 Prepared by: Sherlyn Lees  Exercises Supine Quad Set - 1 x daily - 7 x weekly - 3 sets - 10 reps - 2 sec hold Supine Hip Adduction Isometric with Ball - 1 x daily - 7 x weekly - 3 sets - 10 reps - 2 sec hold Supine Gluteal Sets - 1 x daily - 7 x weekly - 3 sets - 10 reps - 2 sec hold

## 2020-10-02 ENCOUNTER — Encounter (HOSPITAL_COMMUNITY): Payer: Self-pay | Admitting: Specialist

## 2020-10-02 ENCOUNTER — Other Ambulatory Visit: Payer: Self-pay

## 2020-10-02 ENCOUNTER — Ambulatory Visit (HOSPITAL_COMMUNITY): Payer: Managed Care, Other (non HMO) | Admitting: Specialist

## 2020-10-02 DIAGNOSIS — R262 Difficulty in walking, not elsewhere classified: Secondary | ICD-10-CM

## 2020-10-02 NOTE — Therapy (Signed)
[place], [place], [address] Phone: [phone]   Fax:  [phone]  Occupational Therapy Evaluation  Patient Details  Name: Rachel Huber MRN: [id number] Date of Birth: 1956/01/03 Referring Provider (OT): Dr. Frazier Richards   Encounter Date: 10/02/2020   OT End of Session - 10/02/20 1635    Visit Number 1    Number of Visits 1    Date for OT Re-Evaluation 11/20/20    Authorization Type cigna    Authorization - Visit Number 1    Authorization - Number of Visits 60    OT Start Time 1440    OT Stop Time 1515    OT Time Calculation (min) 35 min    Activity Tolerance Patient tolerated treatment well    Behavior During Therapy [facility] for tasks assessed/performed           Past Medical History:  Diagnosis Date  . Anxiety   . Arthritis   . Diabetes mellitus without complication ([place])   . Dyspnea    DOE  . GERD (gastroesophageal reflux disease)   . Hepatitis    PAST  . Hypertension   . Neuropathy   . Neuropathy, diabetic ([place])   . Pneumonia   . Sinus complaint   . Sleep apnea    CPAP  . Thalassemia minor   . Thalassemia minor     Past Surgical History:  Procedure Laterality Date  . ABDOMINAL HYSTERECTOMY    . BREAST BIOPSY Right 02/15/2018   [place] bx 6-6:30 ribbon shape, ONE CORE FRAGMENT WITH FIBROSIS. ONE CORE FRAGMENT WITH PORTION OF A DILATED  . BREAST BIOPSY Right 02/15/2018   [place] bx 9:00 heart shape, USUAL DUCTAL HYPERPLASIA  . BREAST LUMPECTOMY Right 03/09/2018   Procedure: BREAST LUMPECTOMY x 2;  Surgeon: Benjamine Sprague, DO;  Location: [facility] ORS;  Service: General;  Laterality: Right;  . CATARACT EXTRACTION W/PHACO Left 06/11/2016   Procedure: CATARACT EXTRACTION PHACO AND INTRAOCULAR LENS PLACEMENT (IOC);  Surgeon: Estill Cotta, MD;  Location: [facility] ORS;  Service: Ophthalmology;  Laterality: Left;  Lot # [id number] H US:01:38.6 AP%:26.4 CDE:44.15  . CATARACT EXTRACTION W/PHACO Right  06/15/2018   Procedure: CATARACT EXTRACTION PHACO AND INTRAOCULAR LENS PLACEMENT (IOC);  Surgeon: Birder Robson, MD;  Location: [facility] ORS;  Service: Ophthalmology;  Laterality: Right;  [place] 00:38.2 CDE 4.23 Fluid Pack Lot # [id number]  . COLONOSCOPIES    . CTR    . JOINT REPLACEMENT    . KNEE SURGERY Right 09/24/2017   plates and pins  . TOTAL SHOULDER ARTHROPLASTY Right 09/27/2015    There were no vitals filed for this visit.   Subjective Assessment - 10/02/20 1632    Subjective  S:  I have trouble reaching my feet to put my socks and shoes on, and my husband helps me.  I get really fatigued very easily.    Pertinent History Patient was walking out to her car on 09/02/20 when it was snowy and icy and sustained a ground level fall sustaining right greater trochanter fracture and now presents with continued pain along her right hip and reports aching pain and difficulty in walking at house hold distances. Prior to injury pt was independent in all aspects and working full time for    Patient Stated Goals improve energy level    Currently in Pain? Yes    Pain Score 5     Pain Location Hip    Pain Orientation Right    Pain Descriptors /  Indicators Aching    Pain Type Acute pain             [facility] OT Assessment - 10/02/20 0001      Assessment   Medical Diagnosis Right greater trochanter fracture, closed fracture    Referring Provider (OT) Dr. Frazier Richards    Hand Dominance Right      Restrictions   Weight Bearing Restrictions Yes    RLE Weight Bearing Weight bearing as tolerated    Other Position/Activity Restrictions use of RW      Balance Screen   Has the patient fallen in the past 6 months Yes    How many times? 1    Has the patient had a decrease in activity level because of a fear of falling?  No    Is the patient reluctant to leave their home because of a fear of falling?  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    Leisure spending time with grandkids      ADL   ADL comments assist with tub transfer, dressing of lower body and donning and doffing shoes, very fatigued      Written Expression   Dominant Hand Right      Vision - History   Baseline Vision Wears glasses all the time      Cognition   Overall Cognitive Status Within Functional Limits for tasks assessed      Sensation   Light Touch Appears Intact      Coordination   Gross Motor Movements are Fluid and Coordinated Yes    Fine Motor Movements are Fluid and Coordinated Yes          BUE A/ROM and strength are [facility] for ADL completion.                 OT Education - 10/02/20 1634    Education Details educated on energy conservation principles in order to conserve energy and avoid fatigue    Person(s) Educated Patient    Methods Explanation;Handout    Comprehension Verbalized understanding            OT Short Term Goals - 10/02/20 1637      OT SHORT TERM GOAL #1   Title Patient will be educated and independent with use of energy conservation techniques when completing ADLs and IADLs to avoid fatigue.    Time 1    Period Days    Status Achieved                    Plan - 10/02/20 1635    Clinical Impression Statement A:  Patient is approximately 5 weeks s/p right hip fracture.  She is completing most BADLs independently or with assistance from her husband (particularly for lower extremity dressing).  she is completing some cooking, and does so from seated position.  OT educated patient on 4 Ps of energy conservation and how to apply to daily activities as patient is significantly fatigued.    OT Occupational Profile and History Problem Focused Assessment - Including review of records relating to presenting problem    Occupational performance deficits (Please refer to evaluation for details): ADL's;IADL's    Body Structure / Function /  Physical Skills ADL    Rehab Potential Excellent    Clinical Decision Making Limited treatment options, no task modification necessary  Comorbidities Affecting Occupational Performance: None    Modification or Assistance to Complete Evaluation  No modification of tasks or assist necessary to complete eval    OT Frequency One time visit    OT Treatment/Interventions Self-care/ADL training    Plan P:  DC from skilled OT intervention this date.    OT Home Exercise Plan energy conservation    Consulted and Agree with Plan of Care Patient           Patient will benefit from skilled therapeutic intervention in order to improve the following deficits and impairments:   Body Structure / Function / Physical Skills: ADL       Visit Diagnosis: Difficulty in walking, not elsewhere classified - Plan: Ot plan of care cert/re-cert    Problem List Patient Active Problem List   Diagnosis Date Noted  . Acute respiratory failure with hypoxia ([place]) 09/10/2020  . Pneumonia due to COVID-19 virus 09/08/2020  . Hip fracture, right ([place]) 09/04/2020  . Closed right hip fracture ([place]) 09/02/2020  . Type II diabetes mellitus with renal manifestations ([place]) 09/02/2020  . Hypertension   . Thalassemia minor   . Pancytopenia ([place])   . HLD (hyperlipidemia)   . Acute renal failure superimposed on stage 3a chronic kidney disease ([place])   . Depression   . Fall at home, initial encounter   . Nondisplaced fracture of greater trochanter of right femur, initial encounter for closed fracture [facility])     Vangie Bicker, [place], OTR/L [phone]  10/02/2020, 4:42 PM  [place], [place], [address] Phone: [phone]   Fax:  [phone]  Name: Rachel Huber MRN: [id number] Date of Birth: Oct 14, 1955

## 2020-10-02 NOTE — Patient Instructions (Signed)
Energy Conservation What is Patent attorney? After being in the hospital, it is normal to feel tired and weak. You may also feel short of breath and have less energy to do the activities you are used to doing at home. Learning how to conserve your energy helps you build up your strength to take part in your daily activities and other things you enjoy doing. When you learn to conserve energy, you also reduce strain on your heart, fatigue, shortness of breath and stress related pain. Learning to conserve your energy is all about finding a good balance between work, rest and leisure in order to decrease the amount of energy demand on your body. Remember and Practice the 4 Ps 1. Prioritize Decide what needs to be done today and what can be done at a later date or time. For example, going to a doctor's appointment would take priority over dusting the living room. When you have more than one thing to do, begin with the most important to make sure it gets done. 2. Plan Plan your activities first to avoid extra trips. Gather the supplies and equipment you need before doing the job. For example, get your garden supplies and tools ready before you start to plant flowers. Plan to alternate heavy and light tasks. Plan activities throughout the week to avoid doing too many activities in one day. Put a schedule on the refrigerator to remind you and others who is doing what. Plan to get a good rest each night. Use family and friends or pay for help to complete tasks you may struggle with or that require too much energy. 3. Pace Maintain a slow and steady pace. Never rush. 2 3. Pace (continued) Rest often. Rest before you feel tired. Avoid holding your breath. Practice breathing slow and steady. Use pursed lip breathing. Breathe in through your nose for a count of 2 and out from your mouth for a count of 4. This is like blowing out a candle on a cake. Remember that you may have to ask for  help to do some tasks and that is okay. Listen to your body and know your limits. 4. Position Too much bending and reaching can cause fatigue and shortness of breath. Use a reacher, sock aid, long handled shoe horn and/or elastic shoelaces. Avoid bending and reaching too much. Always maintain a nice upright posture when sitting and standing. This helps you get more oxygen into your lungs and around your body to work better. Sit when you can. Sitting supports your body so you can focus on your breathing and activities while conserving your energy. Sitting reduces energy use by 25%. Energy Conservation Tips Dressing and Hygiene Sit when you can. Organize and lay out clothing the night before. Begin dressing your lower half first as this uses more energy. Avoid bending and reaching. Instead, use a reacher, sock aid or long handled shoe horn or lift your legs up onto the bed or chair. Dry off with terry cloth robe. You use less energy than drying off with a towel. If you have a weaker limb or limbs, it is easier to dress the weaker limb first. It is easier to undress your strong limb first. Wear clothes that are easy to put on and take off. For example, use clothes and shoes with velcro instead of small buttons, clasps or laces. 3 Avoid using scented products such as hair products and lotions. These can irritate your lungs and cause shortness of breath for you and others  around you. Many people are allergic to scents. These types of products are not allowed in the hospital. Be cautious when bathing. Use warm, not hot water. This helps eliminate shortness of breath from a buildup of steam and condensation. Use the bathroom equipment suggested by your Occupational Therapist. For example using a bath bench, bath stool, grab bars or a raised toilet seat can make bathing and toileting easier and safer. Shopping Bring a prepared list of things you need to buy. Organize your shopping  list by aisle or section of the store. Transport items in a buggy or shopping cart rather than carrying them in a basket. Load and carry grocery bags that are only half full or shop with someone who can help pack and carry bags. Avoid going out during rush hour when stores and streets are crowded. Consider using a delivery service. Housework Divide activities and do them throughout the week. Balance light with heavy tasks. Make one side of the bed at a time. Sit to change pillow cases and unfold linen. Avoid spray cleaners that may irritate your lungs. Clean the bathtub by sitting or kneeling. Clean one whole room at a time instead of going back and forth between rooms to do each job. Consider asking for help from family members or hiring a cleaning service or housekeeper. After washing dishes, allow them to air dry. Have work in front of you rather than at your side. Slide rather than lift objects. Use long handled dustpans and cleaning sponges to decrease the need for bending. 4 Make a weekly plan for major jobs such as laundry, cleaning and changing sheets on beds. Do one job each day. Keep a trash can in every room to avoid too much walking. Buy more than one of each item you use around the house. For example, keep sink cleaner in the bathroom and kitchen. Keep a vacuum on each level of your home. Cooking Cook and bake in steps to reduce energy use. Gather all ingredients and utensils before starting. Plan ahead with meal preparation. Make large meals and freeze in servings for later use. Use lightweight cookware and dishes to conserve energy. Use paper plates and cups to eliminate dishwashing. Use electric appliances such as can openers, blenders, food processors and dishwasher to conserve energy. Consider buying easy to prepare or frozen meals, or using a meal delivery service. Key Points: Prioritize activities of the day. Do heavier tasks when you have more  energy. Plan your days' and weeks' activities. Set up your work area so you do not have to move around a lot looking for items to complete the task. Plan rest times. Pace yourself. Do not try to complete the whole task in one session. Break it into smaller, easy to do steps. A good guide to follow is to take 10 minutes each hour to rest. Do not rush. Position and Posture are important. Sit to work when you can to use 25% less energy. Sit and stand as upright as you can. Practice deep breathing exercises while you work to maintain your breathing rate and stay relaxed. Use assistive devices when recommended to save energy and make it more comfortable and easy taking care of yourself. Remember. The most important energy conservation tip is to listen to your body. Stop and rest BEFORE you get tired. Plan rest times. Rest Often.

## 2020-10-03 ENCOUNTER — Encounter (HOSPITAL_COMMUNITY): Payer: Self-pay

## 2020-10-03 ENCOUNTER — Ambulatory Visit (HOSPITAL_COMMUNITY): Payer: Managed Care, Other (non HMO)

## 2020-10-03 DIAGNOSIS — R262 Difficulty in walking, not elsewhere classified: Secondary | ICD-10-CM

## 2020-10-03 DIAGNOSIS — M6281 Muscle weakness (generalized): Secondary | ICD-10-CM

## 2020-10-03 NOTE — Therapy (Signed)
[place], [place], [address] Phone: [phone]   Fax:  [phone]  Physical Therapy Treatment  Patient Details  Name: Rachel Huber MRN: [id number] Date of Birth: 09/16/1955 Referring Provider (PT): Dr. Frazier Richards   Encounter Date: 10/03/2020   PT End of Session - 10/03/20 1335    Visit Number 2    Number of Visits 12    Date for PT Re-Evaluation 11/06/20    Authorization Type Generic [employer],    Authorization Time Period VL based on medical necessity, 60 total    Authorization - Visit Number 2    Authorization - Number of Visits 60    Progress Note Due on Visit 10    PT Start Time 1328   late arrival   PT Stop Time 1358    PT Time Calculation (min) 30 min    Activity Tolerance Patient tolerated treatment well    Behavior During Therapy [facility] for tasks assessed/performed           Past Medical History:  Diagnosis Date  . Anxiety   . Arthritis   . Diabetes mellitus without complication ([place])   . Dyspnea    DOE  . GERD (gastroesophageal reflux disease)   . Hepatitis    PAST  . Hypertension   . Neuropathy   . Neuropathy, diabetic ([place])   . Pneumonia   . Sinus complaint   . Sleep apnea    CPAP  . Thalassemia minor   . Thalassemia minor     Past Surgical History:  Procedure Laterality Date  . ABDOMINAL HYSTERECTOMY    . BREAST BIOPSY Right 02/15/2018   [place] bx 6-6:30 ribbon shape, ONE CORE FRAGMENT WITH FIBROSIS. ONE CORE FRAGMENT WITH PORTION OF A DILATED  . BREAST BIOPSY Right 02/15/2018   [place] bx 9:00 heart shape, USUAL DUCTAL HYPERPLASIA  . BREAST LUMPECTOMY Right 03/09/2018   Procedure: BREAST LUMPECTOMY x 2;  Surgeon: Benjamine Sprague, DO;  Location: [facility] ORS;  Service: General;  Laterality: Right;  . CATARACT EXTRACTION W/PHACO Left 06/11/2016   Procedure: CATARACT EXTRACTION PHACO AND INTRAOCULAR LENS PLACEMENT (IOC);  Surgeon: Estill Cotta, MD;  Location: [facility] ORS;  Service:  Ophthalmology;  Laterality: Left;  Lot # [id number] H US:01:38.6 AP%:26.4 CDE:44.15  . CATARACT EXTRACTION W/PHACO Right 06/15/2018   Procedure: CATARACT EXTRACTION PHACO AND INTRAOCULAR LENS PLACEMENT (IOC);  Surgeon: Birder Robson, MD;  Location: [facility] ORS;  Service: Ophthalmology;  Laterality: Right;  [place] 00:38.2 CDE 4.23 Fluid Pack Lot # [id number]  . COLONOSCOPIES    . CTR    . JOINT REPLACEMENT    . KNEE SURGERY Right 09/24/2017   plates and pins  . TOTAL SHOULDER ARTHROPLASTY Right 09/27/2015    There were no vitals filed for this visit.   Subjective Assessment - 10/03/20 1333    Subjective Pt stated her back locked up this morning, has taken muscle relaxor and pain meds an hour prior session.  Reports hip is feeling good today.    Currently in Pain? Yes    Pain Score 5     Pain Location Back    Pain Orientation Lower    Pain Descriptors / Indicators Aching;Sharp    Pain Type Acute pain    Pain Onset Today    Pain Frequency Intermittent              [facility] PT Assessment - 10/03/20 0001      Assessment   Medical Diagnosis Right  greater trochanter fracture, closed fracture    Referring Provider (PT) Dr. Frazier Richards    Onset Date/Surgical Date 09/01/20   date of fx   Hand Dominance Right    Next MD Visit 10/15/20      Restrictions   Weight Bearing Restrictions Yes    RLE Weight Bearing Weight bearing as tolerated    Other Position/Activity Restrictions use of RW                         [facility] Adult PT Treatment/Exercise - 10/03/20 0001      Exercises   Exercises Knee/Hip      Knee/Hip Exercises: Seated   Long Arc Quad 10 reps;Both    Long [employer] Limitations 2 sets      Knee/Hip Exercises: Supine   Quad Sets Strengthening;Right;2 sets;10 reps    Hip Adduction Isometric Strengthening;Both;2 sets;10 reps    Hip Adduction Isometric Limitations Encouraged to increase hold time to 10"    Other Supine Knee/Hip Exercises glute squeeze 2x10     Other Supine Knee/Hip Exercises bent knee raise      Modalities   Modalities Moist Heat      Moist Heat Therapy   Number Minutes Moist Heat 8 Minutes    Moist Heat Location Lumbar Spine   seated position while revieweing goal                 PT Education - 10/03/20 1341    Education Details Reviewed goals, assured compliance wiht current HEP which pt able to recall without any cueing required.    Person(s) Educated Patient    Methods Explanation;Demonstration    Comprehension Verbalized understanding;Returned demonstration            PT Short Term Goals - 09/25/20 1352      PT SHORT TERM GOAL #1   Title Patient will report at least 50% improvement in overall symptoms and function to demonstrate overall improved functional ability    Time 3    Period Weeks    Status New    Target Date 10/16/20      PT SHORT TERM GOAL #2   Title Patient will decrease level of assistance to modified independent for bed mobility    Baseline min A    Time 3    Period Weeks    Status New    Target Date 10/16/20      PT SHORT TERM GOAL #3   Title Demo improved gait tolerance as evidenced by distance of 150 ft during 2MWT    Baseline 80 ft w/ RW and antalgic pattern    Time 3    Period Weeks    Status New    Target Date 10/16/20      PT SHORT TERM GOAL #4   Title Patient will be independent with HEP in order to improve functional outcomes.    Time 3    Period Weeks    Status New    Target Date 10/16/20             PT Long Term Goals - 09/25/20 1358      PT LONG TERM GOAL #1   Title Patient will improve on FOTO score to meet predicted outcomes to improve functional independence    Baseline 44% function    Time 6    Period Weeks    Status New    Target Date 11/06/20      PT  LONG TERM GOAL #2   Title Demonstrate normalized gait pattern with least restrictive AD x 350 ft during 2MWT    Baseline 80 ft wi RW    Time 6    Period Weeks    Status New    Target Date  11/06/20                 Plan - 10/03/20 1410    Clinical Impression Statement Reviewed goals, educated importance of HEP compliance for maximal benefits.  Pt able to recall and demonstrate appropriate mechanics with current exercise program, cueing to improve hold times wiht all isometric exercises.  Pt limited by LBP this session, MHP offered while initial sitting and reviewed goals wiht reports of relief following.  Therex focus on improving bed mobility with minimal assitance required with head elevated supine to sit and LE strengthening.  Pt tolerated well with no reports of increased pain through session.    Personal Factors and Comorbidities Comorbidity 1;Time since onset of injury/illness/exacerbation;Past/Current Experience    Comorbidities PMH and hx of mutiple physical trauma    Examination-Activity Limitations Bathing;Bed Mobility;Bend;Lift;Toileting;Stand;Squat;Locomotion Level;Transfers    Examination-Participation Restrictions Cleaning;Community Activity;Driving;Shop;Occupation    Stability/Clinical Decision Making Stable/Uncomplicated    Clinical Decision Making Low    Rehab Potential Excellent    PT Frequency 2x / week    PT Duration 6 weeks    PT Treatment/Interventions ADLs/Self Care Home Management;Aquatic Therapy;Biofeedback;Cryotherapy;Electrical Stimulation;DME Instruction;Ultrasound;Moist Heat;Gait training;Stair training;Functional mobility training;Therapeutic activities;Therapeutic exercise;Balance training;Patient/family education;Neuromuscular re-education;Manual techniques;Taping;Energy conservation;Dry needling;Spinal Manipulations;Joint Manipulations    PT Next Visit Plan Continue HEP for LE strength, avoid active abduction and painful right hip resistance    PT Home Exercise Plan QS, GS, ball squeeze    Consulted and Agree with Plan of Care Patient           Patient will benefit from skilled therapeutic intervention in order to improve the following  deficits and impairments:  Abnormal gait,Decreased activity tolerance,Decreased balance,Decreased mobility,Decreased knowledge of use of DME,Decreased endurance,Decreased range of motion,Decreased strength,Difficulty walking,Impaired perceived functional ability,Improper body mechanics,Pain  Visit Diagnosis: Difficulty in walking, not elsewhere classified  Muscle weakness (generalized)     Problem List Patient Active Problem List   Diagnosis Date Noted  . Acute respiratory failure with hypoxia ([place]) 09/10/2020  . Pneumonia due to COVID-19 virus 09/08/2020  . Hip fracture, right ([place]) 09/04/2020  . Closed right hip fracture ([place]) 09/02/2020  . Type II diabetes mellitus with renal manifestations ([place]) 09/02/2020  . Hypertension   . Thalassemia minor   . Pancytopenia ([place])   . HLD (hyperlipidemia)   . Acute renal failure superimposed on stage 3a chronic kidney disease ([place])   . Depression   . Fall at home, initial encounter   . Nondisplaced fracture of greater trochanter of right femur, initial encounter for closed fracture [facility])    Ihor Austin, LPTA/CLT; CBIS [phone]  Aldona Lento 10/03/2020, 2:14 PM  [place], [place], [address] Phone: [phone]   Fax:  [phone]  Name: KRYSTI HICKLING MRN: [id number] Date of Birth: 02/01/1956

## 2020-10-08 ENCOUNTER — Encounter (HOSPITAL_COMMUNITY): Payer: 59 | Admitting: Physical Therapy

## 2020-10-08 ENCOUNTER — Inpatient Hospital Stay
Admission: EM | Admit: 2020-10-08 | Discharge: 2020-10-11 | DRG: 394 | Disposition: A | Discharge status: Home / Self Care | Payer: Managed Care, Other (non HMO) | Attending: Internal Medicine | Admitting: Internal Medicine

## 2020-10-08 ENCOUNTER — Other Ambulatory Visit: Payer: Self-pay

## 2020-10-08 DIAGNOSIS — N179 Acute kidney failure, unspecified: Secondary | ICD-10-CM | POA: Diagnosis present

## 2020-10-08 DIAGNOSIS — E1140 Type 2 diabetes mellitus with diabetic neuropathy, unspecified: Secondary | ICD-10-CM | POA: Diagnosis present

## 2020-10-08 DIAGNOSIS — K643 Fourth degree hemorrhoids: Secondary | ICD-10-CM | POA: Diagnosis not present

## 2020-10-08 DIAGNOSIS — K921 Melena: Secondary | ICD-10-CM | POA: Diagnosis present

## 2020-10-08 DIAGNOSIS — I10 Essential (primary) hypertension: Secondary | ICD-10-CM | POA: Diagnosis present

## 2020-10-08 DIAGNOSIS — Z79899 Other long term (current) drug therapy: Secondary | ICD-10-CM

## 2020-10-08 DIAGNOSIS — E1165 Type 2 diabetes mellitus with hyperglycemia: Secondary | ICD-10-CM | POA: Diagnosis present

## 2020-10-08 DIAGNOSIS — Z8616 Personal history of COVID-19: Secondary | ICD-10-CM | POA: Diagnosis not present

## 2020-10-08 DIAGNOSIS — Z803 Family history of malignant neoplasm of breast: Secondary | ICD-10-CM

## 2020-10-08 DIAGNOSIS — Z7984 Long term (current) use of oral hypoglycemic drugs: Secondary | ICD-10-CM | POA: Diagnosis not present

## 2020-10-08 DIAGNOSIS — D563 Thalassemia minor: Secondary | ICD-10-CM | POA: Diagnosis present

## 2020-10-08 DIAGNOSIS — K3189 Other diseases of stomach and duodenum: Secondary | ICD-10-CM | POA: Diagnosis present

## 2020-10-08 DIAGNOSIS — E1122 Type 2 diabetes mellitus with diabetic chronic kidney disease: Secondary | ICD-10-CM | POA: Diagnosis present

## 2020-10-08 DIAGNOSIS — E1169 Type 2 diabetes mellitus with other specified complication: Secondary | ICD-10-CM | POA: Diagnosis present

## 2020-10-08 DIAGNOSIS — N183 Chronic kidney disease, stage 3 unspecified: Secondary | ICD-10-CM | POA: Diagnosis present

## 2020-10-08 DIAGNOSIS — F32A Depression, unspecified: Secondary | ICD-10-CM

## 2020-10-08 DIAGNOSIS — D62 Acute posthemorrhagic anemia: Secondary | ICD-10-CM | POA: Diagnosis present

## 2020-10-08 DIAGNOSIS — E860 Dehydration: Secondary | ICD-10-CM | POA: Diagnosis present

## 2020-10-08 DIAGNOSIS — K625 Hemorrhage of anus and rectum: Secondary | ICD-10-CM | POA: Diagnosis present

## 2020-10-08 DIAGNOSIS — Z96651 Presence of right artificial knee joint: Secondary | ICD-10-CM | POA: Diagnosis present

## 2020-10-08 DIAGNOSIS — I129 Hypertensive chronic kidney disease with stage 1 through stage 4 chronic kidney disease, or unspecified chronic kidney disease: Secondary | ICD-10-CM | POA: Diagnosis present

## 2020-10-08 DIAGNOSIS — E871 Hypo-osmolality and hyponatremia: Secondary | ICD-10-CM | POA: Diagnosis present

## 2020-10-08 DIAGNOSIS — K635 Polyp of colon: Secondary | ICD-10-CM | POA: Diagnosis present

## 2020-10-08 DIAGNOSIS — Z8701 Personal history of pneumonia (recurrent): Secondary | ICD-10-CM

## 2020-10-08 DIAGNOSIS — E119 Type 2 diabetes mellitus without complications: Secondary | ICD-10-CM | POA: Diagnosis present

## 2020-10-08 DIAGNOSIS — Z96611 Presence of right artificial shoulder joint: Secondary | ICD-10-CM | POA: Diagnosis present

## 2020-10-08 DIAGNOSIS — G4733 Obstructive sleep apnea (adult) (pediatric): Secondary | ICD-10-CM | POA: Diagnosis present

## 2020-10-08 LAB — IRON AND TIBC
Iron: 7 ug/dL — ABNORMAL LOW (ref 28–170)
Saturation Ratios: 2 % — ABNORMAL LOW (ref 10.4–31.8)
TIBC: 350 ug/dL (ref 250–450)
UIBC: 343 ug/dL

## 2020-10-08 LAB — CBC
HCT: 16.7 % — ABNORMAL LOW (ref 36.0–46.0)
Hemoglobin: 4.6 g/dL — CL (ref 12.0–15.0)
MCH: 17.8 pg — ABNORMAL LOW (ref 26.0–34.0)
MCHC: 27.5 g/dL — ABNORMAL LOW (ref 30.0–36.0)
MCV: 64.5 fL — ABNORMAL LOW (ref 80.0–100.0)
Platelets: 336 10*3/uL (ref 150–400)
RBC: 2.59 MIL/uL — ABNORMAL LOW (ref 3.87–5.11)
RDW: 19.8 % — ABNORMAL HIGH (ref 11.5–15.5)
WBC: 7.2 10*3/uL (ref 4.0–10.5)
nRBC: 0.6 % — ABNORMAL HIGH (ref 0.0–0.2)

## 2020-10-08 LAB — COMPREHENSIVE METABOLIC PANEL
ALT: 25 U/L (ref 0–44)
AST: 29 U/L (ref 15–41)
Albumin: 2.9 g/dL — ABNORMAL LOW (ref 3.5–5.0)
Alkaline Phosphatase: 80 U/L (ref 38–126)
Anion gap: 11 (ref 5–15)
BUN: 29 mg/dL — ABNORMAL HIGH (ref 8–23)
CO2: 19 mmol/L — ABNORMAL LOW (ref 22–32)
Calcium: 8.6 mg/dL — ABNORMAL LOW (ref 8.9–10.3)
Chloride: 99 mmol/L (ref 98–111)
Creatinine, Ser: 1.37 mg/dL — ABNORMAL HIGH (ref 0.44–1.00)
GFR, Estimated: 44 mL/min — ABNORMAL LOW (ref >60)
Glucose, Bld: 312 mg/dL — ABNORMAL HIGH (ref 70–99)
Potassium: 4.5 mmol/L (ref 3.5–5.1)
Sodium: 129 mmol/L — ABNORMAL LOW (ref 135–145)
Total Bilirubin: 1.3 mg/dL — ABNORMAL HIGH (ref 0.3–1.2)
Total Protein: 5.7 g/dL — ABNORMAL LOW (ref 6.5–8.1)

## 2020-10-08 LAB — ABO/RH: ABO/RH(D): A POS

## 2020-10-08 LAB — PREPARE RBC (CROSSMATCH)

## 2020-10-08 LAB — GLUCOSE, CAPILLARY: Glucose-Capillary: 191 mg/dL — ABNORMAL HIGH (ref 70–99)

## 2020-10-08 LAB — LIPASE, BLOOD: Lipase: 38 U/L (ref 11–51)

## 2020-10-08 MED ORDER — GABAPENTIN 400 MG PO CAPS
800.0000 mg | ORAL_CAPSULE | Freq: Two times a day (BID) | ORAL | Status: DC
  Administered 2020-10-08 – 2020-10-11 (×6): 800 mg via ORAL
  Filled 2020-10-08 (×6): qty 2

## 2020-10-08 MED ORDER — ONDANSETRON HCL 4 MG/2ML IJ SOLN
4.0000 mg | Freq: Once | INTRAMUSCULAR | Status: AC
  Administered 2020-10-08: 4 mg via INTRAVENOUS
  Filled 2020-10-08: qty 2

## 2020-10-08 MED ORDER — SODIUM CHLORIDE 0.9 % IV SOLN: INTRAVENOUS | Status: DC

## 2020-10-08 MED ORDER — METHOCARBAMOL 500 MG PO TABS
500.0000 mg | ORAL_TABLET | Freq: Three times a day (TID) | ORAL | Status: DC | PRN
  Filled 2020-10-08: qty 1

## 2020-10-08 MED ORDER — SODIUM CHLORIDE 0.9 % IV SOLN
10.0000 mL/h | Freq: Once | INTRAVENOUS | Status: AC
  Administered 2020-10-08: 10 mL/h via INTRAVENOUS

## 2020-10-08 MED ORDER — SERTRALINE HCL 50 MG PO TABS
50.0000 mg | ORAL_TABLET | Freq: Every day | ORAL | Status: DC
  Administered 2020-10-09 – 2020-10-11 (×3): 50 mg via ORAL
  Filled 2020-10-08 (×3): qty 1

## 2020-10-08 MED ORDER — ACETAMINOPHEN ER 650 MG PO TBCR: 1300.0000 mg | EXTENDED_RELEASE_TABLET | Freq: Every day | ORAL | Status: DC | PRN

## 2020-10-08 MED ORDER — INSULIN ASPART 100 UNIT/ML ~~LOC~~ SOLN
0.0000 [IU] | Freq: Three times a day (TID) | SUBCUTANEOUS | Status: DC
  Administered 2020-10-09: 3 [IU] via SUBCUTANEOUS
  Administered 2020-10-09: 2 [IU] via SUBCUTANEOUS
  Administered 2020-10-09: 3 [IU] via SUBCUTANEOUS
  Administered 2020-10-10: 2 [IU] via SUBCUTANEOUS
  Administered 2020-10-10: 3 [IU] via SUBCUTANEOUS
  Administered 2020-10-11: 2 [IU] via SUBCUTANEOUS
  Administered 2020-10-11: 5 [IU] via SUBCUTANEOUS
  Filled 2020-10-08 (×8): qty 1

## 2020-10-08 MED ORDER — SODIUM CHLORIDE 0.9 % IV SOLN
8.0000 mg/h | INTRAVENOUS | Status: DC
  Administered 2020-10-08 – 2020-10-11 (×5): 8 mg/h via INTRAVENOUS
  Filled 2020-10-08 (×6): qty 80

## 2020-10-08 MED ORDER — SODIUM CHLORIDE 0.9 % IV BOLUS
1000.0000 mL | Freq: Once | INTRAVENOUS | Status: AC
  Administered 2020-10-08: 1000 mL via INTRAVENOUS

## 2020-10-08 MED ORDER — SODIUM CHLORIDE 0.9 % IV SOLN: Freq: Once | INTRAVENOUS | Status: AC

## 2020-10-08 MED ORDER — SODIUM CHLORIDE 0.9 % IV SOLN
80.0000 mg | Freq: Once | INTRAVENOUS | Status: AC
  Administered 2020-10-08: 80 mg via INTRAVENOUS
  Filled 2020-10-08: qty 80

## 2020-10-08 MED ORDER — ACETAMINOPHEN 325 MG PO TABS: 650.0000 mg | ORAL_TABLET | Freq: Four times a day (QID) | ORAL | Status: DC | PRN

## 2020-10-08 MED ORDER — ATORVASTATIN CALCIUM 20 MG PO TABS
40.0000 mg | ORAL_TABLET | Freq: Every day | ORAL | Status: DC
Start: 2020-10-08 — End: 2020-10-11
  Administered 2020-10-08 – 2020-10-11 (×4): 40 mg via ORAL
  Filled 2020-10-08 (×4): qty 2

## 2020-10-08 MED ORDER — OXYCODONE-ACETAMINOPHEN 5-325 MG PO TABS: 1.0000 | ORAL_TABLET | Freq: Four times a day (QID) | ORAL | Status: DC | PRN

## 2020-10-08 NOTE — ED Triage Notes (Addendum)
Pt comes via POV from home with c/o vomiting for 7 days. Pt denies any belly pain. Pt states she is unable to keep anything down. Pt states she has been throwing up yellow bile.  Pt states she is probably dehydrated.  Pt states she has had some bleeding related to a couple of hemorrhoids.

## 2020-10-08 NOTE — ED Notes (Signed)
Entered room to find pt resting quietly - awake and alert; no acute distress noted.  Pt does not verbalize any complaints verbalized at this time.  1st unit prbcs infusing @120ml /hr to 20G L AC; dressing dry and intact -- Protonix drip infusing @10ml /hr to 22G L lower FA.  RR even and unlabored on RA.  Cardiac monitoring maintained - Sinus tach HR 104

## 2020-10-08 NOTE — ED Provider Notes (Signed)
[facility] Emergency Department Provider Note  Time seen: 2:19 PM  I have reviewed the triage vital signs and the nursing notes.   HISTORY  Chief Complaint Emesis   HPI Rachel Huber is a 65 y.o. female with a past medical history of anxiety, arthritis, diabetes, thalassemia, presents to the emergency department for generalized fatigue weakness vomiting and rectal bleeding.   According to the patient over the past 7 to 10 days she has been nauseated with intermittent episodes of vomiting.  States she is having trouble keeping fluids down.  States her vomit has been yellow in color.  Patient denies any abdominal pain.  States she has a history of hemorrhoids and has had rectal bleeding which has increased recently over the past 2 to 3 days.  She has also noticed over the past 2 to 3 days she is getting short of breath with exertion and generalized fatigue.  Denies any fever.  No recent illnesses.  Past Medical History:  Diagnosis Date  . Anxiety   . Arthritis   . Diabetes mellitus without complication ([place])   . Dyspnea    DOE  . GERD (gastroesophageal reflux disease)   . Hepatitis    PAST  . Hypertension   . Neuropathy   . Neuropathy, diabetic ([place])   . Pneumonia   . Sinus complaint   . Sleep apnea    CPAP  . Thalassemia minor   . Thalassemia minor     Patient Active Problem List   Diagnosis Date Noted  . Acute blood loss anemia 10/08/2020  . Acute respiratory failure with hypoxia ([place]) 09/10/2020  . Pneumonia due to COVID-19 virus 09/08/2020  . Hip fracture, right ([place]) 09/04/2020  . Closed right hip fracture ([place]) 09/02/2020  . Type II diabetes mellitus with renal manifestations ([place]) 09/02/2020  . Hypertension   . Thalassemia minor   . Pancytopenia ([place])   . HLD (hyperlipidemia)   . Acute renal failure superimposed on stage 3a chronic kidney disease ([place])   . Depression   . Fall at home, initial encounter   . Nondisplaced fracture of  greater trochanter of right femur, initial encounter for closed fracture [facility])     Past Surgical History:  Procedure Laterality Date  . ABDOMINAL HYSTERECTOMY    . BREAST BIOPSY Right 02/15/2018   [place] bx 6-6:30 ribbon shape, ONE CORE FRAGMENT WITH FIBROSIS. ONE CORE FRAGMENT WITH PORTION OF A DILATED  . BREAST BIOPSY Right 02/15/2018   [place] bx 9:00 heart shape, USUAL DUCTAL HYPERPLASIA  . BREAST LUMPECTOMY Right 03/09/2018   Procedure: BREAST LUMPECTOMY x 2;  Surgeon: Benjamine Sprague, DO;  Location: [facility] ORS;  Service: General;  Laterality: Right;  . CATARACT EXTRACTION W/PHACO Left 06/11/2016   Procedure: CATARACT EXTRACTION PHACO AND INTRAOCULAR LENS PLACEMENT (IOC);  Surgeon: Estill Cotta, MD;  Location: [facility] ORS;  Service: Ophthalmology;  Laterality: Left;  Lot # [id number] H US:01:38.6 AP%:26.4 CDE:44.15  . CATARACT EXTRACTION W/PHACO Right 06/15/2018   Procedure: CATARACT EXTRACTION PHACO AND INTRAOCULAR LENS PLACEMENT (IOC);  Surgeon: Birder Robson, MD;  Location: [facility] ORS;  Service: Ophthalmology;  Laterality: Right;  [place] 00:38.2 CDE 4.23 Fluid Pack Lot # [id number]  . COLONOSCOPIES    . CTR    . JOINT REPLACEMENT    . KNEE SURGERY Right 09/24/2017   plates and pins  . TOTAL SHOULDER ARTHROPLASTY Right 09/27/2015    Prior to Admission medications   Medication Sig Start Date End Date Taking? Authorizing Provider  acetaminophen (  TYLENOL) 650 MG CR tablet Take 1,300 mg by mouth daily as needed for pain.    [provider]  atorvastatin (LIPITOR) 40 MG tablet Take 40 mg by mouth daily. 12/12/17   [provider]  dexamethasone (DECADRON) 6 MG tablet Take 1 tablet (6 mg total) by mouth daily. 09/13/20   Aline August, MD  diphenhydrAMINE (BENADRYL) 25 MG tablet Take 25 mg by mouth daily as needed for allergies.    [provider]  gabapentin (NEURONTIN) 400 MG capsule Take 800 mg by mouth 2 (two) times daily. 01/07/18   [provider]  glimepiride  (AMARYL) 2 MG tablet Take 2 mg by mouth daily. 01/04/18   [provider]  guaiFENesin-dextromethorphan (ROBITUSSIN DM) 100-10 MG/5ML syrup Take 5 mLs by mouth every 4 (four) hours as needed for cough. 09/13/20   Aline August, MD  metFORMIN (GLUCOPHAGE-XR) 500 MG 24 hr tablet Take 1,000 mg by mouth 2 (two) times daily. 02/02/18   [provider]  methocarbamol (ROBAXIN) 500 MG tablet Take 1 tablet (500 mg total) by mouth every 8 (eight) hours as needed for muscle spasms. 09/13/20   Aline August, MD  oxyCODONE-acetaminophen (PERCOCET/ROXICET) 5-325 MG tablet Take 1 tablet by mouth every 6 (six) hours as needed for severe pain. 09/13/20   Aline August, MD  polyethylene glycol (MIRALAX) 17 g packet Take 17 g by mouth daily as needed for moderate constipation. 09/13/20   Aline August, MD  senna-docusate (SENOKOT-S) 8.6-50 MG tablet Take 1 tablet by mouth at bedtime. 09/13/20   Aline August, MD  sertraline (ZOLOFT) 50 MG tablet Take 50 mg by mouth daily. 12/12/17   [provider]  TRULICITY 4.5 WJ/1.9JY SOPN Inject 4.5 mg into the skin once a week. 07/13/20   [provider]    Allergies  Allergen Reactions  . Latex Rash    Contact rash    Family History  Problem Relation Age of Onset  . Breast cancer Mother 44  . Breast cancer Sister 77    Social History Social History   Tobacco Use  . Smoking status: Never Smoker  . Smokeless tobacco: Never Used  Vaping Use  . Vaping Use: Never used  Substance Use Topics  . Alcohol use: No  . Drug use: Never    Review of Systems Constitutional: Negative for fever.  Generalized fatigue and weakness. Cardiovascular: Negative for chest pain. Respiratory: Negative for shortness of breath. Gastrointestinal: Negative for abdominal pain.  Intermittent nausea vomiting.  Bright red blood per rectum x3 days. Genitourinary: Negative for urinary compaints Musculoskeletal: Negative for musculoskeletal  complaint Neurological: Negative for headache All other ROS negative  ____________________________________________   PHYSICAL EXAM:  VITAL SIGNS: ED Triage Vitals  Enc Vitals Group     BP 10/08/20 1237 (!) 119/48     Pulse Rate 10/08/20 1237 (!) 122     Resp 10/08/20 1237 18     Temp 10/08/20 1237 (!) 97.5 F (36.4 C)     Temp Source 10/08/20 1237 Oral     SpO2 10/08/20 1237 98 %     Weight --      Height --      Head Circumference --      Peak Flow --      Pain Score 10/08/20 1244 0     Pain Loc --      Pain Edu? --      Excl. in [place]? --    Constitutional: Alert and oriented. Well appearing and  in no distress. Eyes: Normal exam ENT      Head: Normocephalic and atraumatic.      Mouth/Throat: Mucous membranes are moist. Cardiovascular: Normal rate, regular rhythm Respiratory: Normal respiratory effort without tachypnea nor retractions. Breath sounds are clear Gastrointestinal: Soft and nontender. No distention.   Musculoskeletal: Nontender with normal range of motion in all extremities.  Neurologic:  Normal speech and language. No gross focal neurologic deficits  Skin:  Skin is warm, dry and intact.  Psychiatric: Mood and affect are normal.    INITIAL IMPRESSION / ASSESSMENT AND PLAN / ED COURSE  Pertinent labs & imaging results that were available during my care of the patient were reviewed by me and considered in my medical decision making (see chart for details).   Patient presents to the emergency department for generalized fatigue weakness intermittent nausea vomiting over the past 1 week and rectal bleeding x3 days.  Overall the patient appears well.  Hemoglobin has resulted at 4.6 from a baseline between 7.5 and 8.  Given the patient's low hemoglobin and rectal bleeding I have consented the patient for blood products.  We will transfuse 3 units.  Patient will require admission to the hospital service for further work-up and treatment with GI consultation.  Patient  denies any history of significant GI bleeding in the past besides hemorrhoids.  Does not believe she has ever had a blood transfusion.  Lab work also shows mild renal insufficiency.  We will IV hydrate in addition to blood products, suspect a degree of dehydration.  Patient agreeable plan of care.  [name] was evaluated in Emergency Department on 10/08/2020 for the symptoms described in the history of present illness. She was evaluated in the context of the global COVID-19 pandemic, which necessitated consideration that the patient might be at risk for infection with the SARS-CoV-2 virus that causes COVID-19. Institutional protocols and algorithms that pertain to the evaluation of patients at risk for COVID-19 are in a state of rapid change based on information released by regulatory bodies including the CDC and federal and state organizations. These policies and algorithms were followed during the patient's care in the ED.  CRITICAL CARE Performed by: Harvest Dark   Total critical care time: 30 minutes  Critical care time was exclusive of separately billable procedures and treating other patients.  Critical care was necessary to treat or prevent imminent or life-threatening deterioration.  Critical care was time spent personally by me on the following activities: development of treatment plan with patient and/or surrogate as well as nursing, discussions with consultants, evaluation of patient's response to treatment, examination of patient, obtaining history from patient or surrogate, ordering and performing treatments and interventions, ordering and review of laboratory studies, ordering and review of radiographic studies, pulse oximetry and re-evaluation of patient's condition.  ____________________________________________   FINAL CLINICAL IMPRESSION(S) / ED DIAGNOSES  GI bleed Symptomatic anemia   Harvest Dark, MD 10/08/20 1426

## 2020-10-08 NOTE — ED Notes (Signed)
Pt taken back to room att his time. MD Pad informed of hgb level of 4.6. RN Anderson Malta informed of pt to room

## 2020-10-08 NOTE — H&P (Signed)
History and Physical    GRENDA LORA JOA:[id number] DOB: June 14, 1956 DOA: 10/08/2020  PCP: [name], MD   Patient coming from: Home  I have personally briefly reviewed patient's old medical records in [place]  Chief Complaint: Weakness  HPI: Rachel Huber is a 65 y.o. female with medical history significant for diabetes mellitus on oral hypoglycemic agents, hypertension, obesity who presents to the ER via private vehicle for evaluation of generalized weakness and fatigue.  Patient was recently hospitalized for Covid pneumonia and was discharged home.  She states that during that hospitalization she had episodes of rectal bleeding which she assumed was from her hemorrhoids.  She was discharged home and her hemoglobin was in stable condition at the time of her discharge.  She has continued to have intermittent, painless bright red blood per rectum and over the last couple of days states that the frequency and the amount of bleeding has increased.   She also complains of nausea and emesis of bilious material but denies having any abdominal pain.  She denies feeling dizzy or lightheaded and has had no fever or chills.  She denies NSAID use. She denies having any palpitations, no diaphoresis, no sore throat, no cough, no headache, no blurred vision, no urinary frequency, no nocturia, no dysuria. Labs show sodium 129, potassium 4.5, chloride 99, bicarb 19, glucose 312, BUN 29, creatinine 1.37, calcium 8.6, alkaline phosphatase 80, albumin 2.9, lipase 38, AST 29, ALT 25, total protein 5.7, white count 7.2, hemoglobin 4.6 compared to baseline of 7.5, hematocrit 16.7, MCV 64.5, RDW 19.8, platelet count 336   ED Course: Patient is a 65 year old Caucasian female who presents to the ER for evaluation of weakness and fatigue.  She has had episodes of rectal bleeding which she describes as painless bright red bleeding per rectum.  Her hemoglobin today is 4.6g/dl.  She will be admitted to  the hospital for further evaluation of acute blood loss anemia secondary to rectal bleed.   Review of Systems: As per HPI otherwise all other systems reviewed and negative.    Past Medical History:  Diagnosis Date  . Anxiety   . Arthritis   . Diabetes mellitus without complication ([place])   . Dyspnea    DOE  . GERD (gastroesophageal reflux disease)   . Hepatitis    PAST  . Hypertension   . Neuropathy   . Neuropathy, diabetic ([place])   . Pneumonia   . Sinus complaint   . Sleep apnea    CPAP  . Thalassemia minor   . Thalassemia minor     Past Surgical History:  Procedure Laterality Date  . ABDOMINAL HYSTERECTOMY    . BREAST BIOPSY Right 02/15/2018   [place] bx 6-6:30 ribbon shape, ONE CORE FRAGMENT WITH FIBROSIS. ONE CORE FRAGMENT WITH PORTION OF A DILATED  . BREAST BIOPSY Right 02/15/2018   [place] bx 9:00 heart shape, USUAL DUCTAL HYPERPLASIA  . BREAST LUMPECTOMY Right 03/09/2018   Procedure: BREAST LUMPECTOMY x 2;  Surgeon: Benjamine Sprague, DO;  Location: [facility] ORS;  Service: General;  Laterality: Right;  . CATARACT EXTRACTION W/PHACO Left 06/11/2016   Procedure: CATARACT EXTRACTION PHACO AND INTRAOCULAR LENS PLACEMENT (IOC);  Surgeon: Estill Cotta, MD;  Location: [facility] ORS;  Service: Ophthalmology;  Laterality: Left;  Lot # [id number] H US:01:38.6 AP%:26.4 CDE:44.15  . CATARACT EXTRACTION W/PHACO Right 06/15/2018   Procedure: CATARACT EXTRACTION PHACO AND INTRAOCULAR LENS PLACEMENT (IOC);  Surgeon: Birder Robson, MD;  Location: [facility] ORS;  Service: Ophthalmology;  Laterality: Right;  [place] 00:38.2 CDE 4.23 Fluid Pack Lot # [id number]  . COLONOSCOPIES    . CTR    . JOINT REPLACEMENT    . KNEE SURGERY Right 09/24/2017   plates and pins  . TOTAL SHOULDER ARTHROPLASTY Right 09/27/2015     reports that she has never smoked. She has never used smokeless tobacco. She reports that she does not drink alcohol and does not use drugs.  Allergies  Allergen Reactions  . Latex Rash    Contact  rash    Family History  Problem Relation Age of Onset  . Breast cancer Mother 56  . Breast cancer Sister 37      Prior to Admission medications   Medication Sig Start Date End Date Taking? Authorizing Provider  acetaminophen (TYLENOL) 650 MG CR tablet Take 1,300 mg by mouth daily as needed for pain.    [provider]  atorvastatin (LIPITOR) 40 MG tablet Take 40 mg by mouth daily. 12/12/17   [provider]  dexamethasone (DECADRON) 6 MG tablet Take 1 tablet (6 mg total) by mouth daily. 09/13/20   Aline August, MD  diphenhydrAMINE (BENADRYL) 25 MG tablet Take 25 mg by mouth daily as needed for allergies.    [provider]  gabapentin (NEURONTIN) 400 MG capsule Take 800 mg by mouth 2 (two) times daily. 01/07/18   [provider]  glimepiride (AMARYL) 2 MG tablet Take 2 mg by mouth daily. 01/04/18   [provider]  guaiFENesin-dextromethorphan (ROBITUSSIN DM) 100-10 MG/5ML syrup Take 5 mLs by mouth every 4 (four) hours as needed for cough. 09/13/20   Aline August, MD  metFORMIN (GLUCOPHAGE-XR) 500 MG 24 hr tablet Take 1,000 mg by mouth 2 (two) times daily. 02/02/18   [provider]  methocarbamol (ROBAXIN) 500 MG tablet Take 1 tablet (500 mg total) by mouth every 8 (eight) hours as needed for muscle spasms. 09/13/20   Aline August, MD  oxyCODONE-acetaminophen (PERCOCET/ROXICET) 5-325 MG tablet Take 1 tablet by mouth every 6 (six) hours as needed for severe pain. 09/13/20   Aline August, MD  polyethylene glycol (MIRALAX) 17 g packet Take 17 g by mouth daily as needed for moderate constipation. 09/13/20   Aline August, MD  senna-docusate (SENOKOT-S) 8.6-50 MG tablet Take 1 tablet by mouth at bedtime. 09/13/20   Aline August, MD  sertraline (ZOLOFT) 50 MG tablet Take 50 mg by mouth daily. 12/12/17   [provider]  TRULICITY 4.5 TF/5.7DU SOPN Inject 4.5 mg into the skin once a week. 07/13/20   [provider]     Physical Exam: Vitals:   10/08/20 1237 10/08/20 1416  BP: (!) 119/48 124/65  Pulse: (!) 122 (!) 110  Resp: 18 (!) 9  Temp: (!) 97.5 F (36.4 C)   TempSrc: Oral   SpO2: 98% 100%     Vitals:   10/08/20 1237 10/08/20 1416  BP: (!) 119/48 124/65  Pulse: (!) 122 (!) 110  Resp: 18 (!) 9  Temp: (!) 97.5 F (36.4 C)   TempSrc: Oral   SpO2: 98% 100%      Constitutional: Alert and oriented x 3 . Not in any apparent distress.  HEENT:      Head: Normocephalic and atraumatic.         Eyes: PERLA, EOMI, Conjunctivae pallor. Sclera is non-icteric.       Mouth/Throat: Mucous membranes are moist.       Neck: Supple with no signs of meningismus. Cardiovascular:  Tachycardia.  No murmurs, gallops, or rubs. 2+ symmetrical distal pulses are present . No JVD. No LE edema Respiratory: Respiratory effort normal .Lungs sounds clear bilaterally. No wheezes, crackles, or rhonchi.  Gastrointestinal: Soft, non tender, and non distended with positive bowel sounds.  Genitourinary: No CVA tenderness. Musculoskeletal: Nontender with normal range of motion in all extremities. No cyanosis, or erythema of extremities. Neurologic:  Face is symmetric. Moving all extremities. No gross focal neurologic deficits . Skin: Skin is warm, dry.  No rash or ulcers Psychiatric: Mood and affect are normal   Labs on Admission: I have personally reviewed following labs and imaging studies  CBC: Recent Labs  Lab 10/08/20 1220  WBC 7.2  HGB 4.6*  HCT 16.7*  MCV 64.5*  PLT 413   Basic Metabolic Panel: Recent Labs  Lab 10/08/20 1220  NA 129*  K 4.5  CL 99  CO2 19*  GLUCOSE 312*  BUN 29*  CREATININE 1.37*  CALCIUM 8.6*   GFR: CrCl cannot be calculated (Unknown ideal weight.). Liver Function Tests: Recent Labs  Lab 10/08/20 1220  AST 29  ALT 25  ALKPHOS 80  BILITOT 1.3*  PROT 5.7*  ALBUMIN 2.9*   Recent Labs  Lab 10/08/20 1220  LIPASE 38   No results for input(s): AMMONIA in the last  168 hours. Coagulation Profile: No results for input(s): INR, PROTIME in the last 168 hours. Cardiac Enzymes: No results for input(s): CKTOTAL, CKMB, CKMBINDEX, TROPONINI in the last 168 hours. BNP (last 3 results) No results for input(s): PROBNP in the last 8760 hours. HbA1C: No results for input(s): HGBA1C in the last 72 hours. CBG: No results for input(s): GLUCAP in the last 168 hours. Lipid Profile: No results for input(s): CHOL, HDL, LDLCALC, TRIG, CHOLHDL, LDLDIRECT in the last 72 hours. Thyroid Function Tests: No results for input(s): TSH, T4TOTAL, FREET4, T3FREE, THYROIDAB in the last 72 hours. Anemia Panel: No results for input(s): VITAMINB12, FOLATE, FERRITIN, TIBC, IRON, RETICCTPCT in the last 72 hours. Urine analysis: No results found for: COLORURINE, APPEARANCEUR, LABSPEC, PHURINE, GLUCOSEU, HGBUR, BILIRUBINUR, KETONESUR, PROTEINUR, UROBILINOGEN, NITRITE, LEUKOCYTESUR  Radiological Exams on Admission: No results found.   Assessment/Plan Principal Problem:   Acute blood loss anemia Active Problems:   Diabetes mellitus with hyperglycemia (HCC)   Hypertension   Depression   Hyponatremia   Rectal bleeding    Acute blood loss anemia Most likely secondary to painless rectal bleeding from ??  Hemorrhoids Patient's hemoglobin on admission is 4.6g/dl compared to 7.5g/dl about 3 weeks ago We will type and crossmatch and will transfuse 3 units of packed RBC Obtain iron/TIBC levels Consult gastroenterology    Diabetes mellitus with hyperglycemia Most likely secondary to medication noncompliance as a result of nausea and vomiting (patient is on Metformin and glimepiride at home) We will place patient on clear liquid diet Glycemic control with sliding scale insulin IV fluid hydration    Hyponatremia Most likely multifactorial and secondary to hypoglycemia as well as poor oral intake We will hydrate patient Repeat sodium levels in  a.m.    Dehydration Secondary to poor oral intake from nausea and vomiting Patient serum creatinine today on admission is 1.3 compared to baseline of 0.9 Continue IV fluid hydration Repeat renal parameters in a.m.    Depression Continue sertraline  DVT prophylaxis: SCD Code Status: full code Family Communication: Greater than 50% of time was spent discussing patient's condition and plan of care with her at the bedside.  All questions and concerns have been addressed.  She verbalizes understanding and agrees with the plan. Disposition Plan: Back to previous home environment Consults called: Gastroenterology Status: Inpatient.  The medical decision making for this patient was of high complexity and patient is at high risk for clinical deterioration during this hospitalization.    Collier Bullock MD [facility]     10/08/2020, 2:50 PM

## 2020-10-08 NOTE — Consult Note (Signed)
Consultation  Referring Provider:  Hospitalist    Admit date: 10/08/2020 Consult date: 10/08/2020         Reason for Consultation: Hematochezia           HPI:   Rachel Huber is a 65 y.o. lady with recent hospitalization for COVID pneumonia with hemoglobin at 7.5 at discharge. Patient has past medical history of morbid obesity, hypertension, multiple joint replacements, and thrombocytopenia with one ct scan suggesting possible cirrhosis. She presents today with fatigue and small volume hematochezia which has been going on for sometime including during her last hospitalization. She has had hemorrhoids in the past and her last colonoscopy was in 2017 for rectal bleeding with one small TA noted. No EGD has ever been performed. She states that she was diagnosed with thalassemia minor but her hemoglobins in the past have been around 9. She had some imaging done previously with findings concerning for possible early cirrhosis. Patient has had daily vomiting for 7-10 days but feels hungry. Denies any blood thinners or NSAID use.  Past Medical History:  Diagnosis Date  . Anxiety   . Arthritis   . Diabetes mellitus without complication ([place])   . Dyspnea    DOE  . GERD (gastroesophageal reflux disease)   . Hepatitis    PAST  . Hypertension   . Neuropathy   . Neuropathy, diabetic ([place])   . Pneumonia   . Sinus complaint   . Sleep apnea    CPAP  . Thalassemia minor   . Thalassemia minor     Past Surgical History:  Procedure Laterality Date  . ABDOMINAL HYSTERECTOMY    . BREAST BIOPSY Right 02/15/2018   [place] bx 6-6:30 ribbon shape, ONE CORE FRAGMENT WITH FIBROSIS. ONE CORE FRAGMENT WITH PORTION OF A DILATED  . BREAST BIOPSY Right 02/15/2018   [place] bx 9:00 heart shape, USUAL DUCTAL HYPERPLASIA  . BREAST LUMPECTOMY Right 03/09/2018   Procedure: BREAST LUMPECTOMY x 2;  Surgeon: Benjamine Sprague, DO;  Location: [facility] ORS;  Service: General;  Laterality: Right;  . CATARACT EXTRACTION W/PHACO Left  06/11/2016   Procedure: CATARACT EXTRACTION PHACO AND INTRAOCULAR LENS PLACEMENT (IOC);  Surgeon: Estill Cotta, MD;  Location: [facility] ORS;  Service: Ophthalmology;  Laterality: Left;  Lot # [id number] H US:01:38.6 AP%:26.4 CDE:44.15  . CATARACT EXTRACTION W/PHACO Right 06/15/2018   Procedure: CATARACT EXTRACTION PHACO AND INTRAOCULAR LENS PLACEMENT (IOC);  Surgeon: Birder Robson, MD;  Location: [facility] ORS;  Service: Ophthalmology;  Laterality: Right;  [place] 00:38.2 CDE 4.23 Fluid Pack Lot # [id number]  . COLONOSCOPIES    . CTR    . JOINT REPLACEMENT    . KNEE SURGERY Right 09/24/2017   plates and pins  . TOTAL SHOULDER ARTHROPLASTY Right 09/27/2015    Family History  Problem Relation Age of Onset  . Breast cancer Mother 44  . Breast cancer Sister 1    Social History   Tobacco Use  . Smoking status: Never Smoker  . Smokeless tobacco: Never Used  Vaping Use  . Vaping Use: Never used  Substance Use Topics  . Alcohol use: No  . Drug use: Never    Prior to Admission medications   Medication Sig Start Date End Date Taking? Authorizing Provider  acetaminophen (TYLENOL) 650 MG CR tablet Take 1,300 mg by mouth daily as needed for pain.    [provider]  atorvastatin (LIPITOR) 40 MG tablet Take 40 mg by mouth daily. 12/12/17   [provider]  dexamethasone (DECADRON) 6 MG  tablet Take 1 tablet (6 mg total) by mouth daily. 09/13/20   Aline August, MD  diphenhydrAMINE (BENADRYL) 25 MG tablet Take 25 mg by mouth daily as needed for allergies.    [provider]  gabapentin (NEURONTIN) 400 MG capsule Take 800 mg by mouth 2 (two) times daily. 01/07/18   [provider]  glimepiride (AMARYL) 2 MG tablet Take 2 mg by mouth daily. 01/04/18   [provider]  guaiFENesin-dextromethorphan (ROBITUSSIN DM) 100-10 MG/5ML syrup Take 5 mLs by mouth every 4 (four) hours as needed for cough. 09/13/20   Aline August, MD  metFORMIN (GLUCOPHAGE-XR) 500 MG 24  hr tablet Take 1,000 mg by mouth 2 (two) times daily. 02/02/18   [provider]  methocarbamol (ROBAXIN) 500 MG tablet Take 1 tablet (500 mg total) by mouth every 8 (eight) hours as needed for muscle spasms. 09/13/20   Aline August, MD  oxyCODONE-acetaminophen (PERCOCET/ROXICET) 5-325 MG tablet Take 1 tablet by mouth every 6 (six) hours as needed for severe pain. 09/13/20   Aline August, MD  polyethylene glycol (MIRALAX) 17 g packet Take 17 g by mouth daily as needed for moderate constipation. 09/13/20   Aline August, MD  senna-docusate (SENOKOT-S) 8.6-50 MG tablet Take 1 tablet by mouth at bedtime. 09/13/20   Aline August, MD  sertraline (ZOLOFT) 50 MG tablet Take 50 mg by mouth daily. 12/12/17   [provider]  TRULICITY 4.5 YP/9.5KD SOPN Inject 4.5 mg into the skin once a week. 07/13/20   [provider]    Current Facility-Administered Medications  Medication Dose Route Frequency Provider Last Rate Last Admin  . 0.9 %  sodium chloride infusion  10 mL/hr Intravenous Once Harvest Dark, MD      . 0.9 %  sodium chloride infusion   Intravenous Continuous Agbata, Tochukwu, MD      . 0.9 %  sodium chloride infusion   Intravenous Once Agbata, Tochukwu, MD      . acetaminophen (TYLENOL) tablet 650 mg  650 mg Oral Q6H PRN Agbata, Tochukwu, MD      . atorvastatin (LIPITOR) tablet 40 mg  40 mg Oral Daily Agbata, Tochukwu, MD      . gabapentin (NEURONTIN) capsule 800 mg  800 mg Oral BID Agbata, Tochukwu, MD      . insulin aspart (novoLOG) injection 0-15 Units  0-15 Units Subcutaneous TID WC Agbata, Tochukwu, MD      . methocarbamol (ROBAXIN) tablet 500 mg  500 mg Oral Q8H PRN Agbata, Tochukwu, MD      . ondansetron (ZOFRAN) injection 4 mg  4 mg Intravenous Once Harvest Dark, MD      . oxyCODONE-acetaminophen (PERCOCET/ROXICET) 5-325 MG per tablet 1 tablet  1 tablet Oral Q6H PRN Agbata, Tochukwu, MD      . pantoprazole (PROTONIX) 80 mg in sodium chloride 0.9 %  100 mL (0.8 mg/mL) infusion  8 mg/hr Intravenous Continuous Harvest Dark, MD      . pantoprazole (PROTONIX) 80 mg in sodium chloride 0.9 % 100 mL IVPB  80 mg Intravenous Once Harvest Dark, MD      . sertraline (ZOLOFT) tablet 50 mg  50 mg Oral Daily Agbata, Tochukwu, MD      . sodium chloride 0.9 % bolus 1,000 mL  1,000 mL Intravenous Once Harvest Dark, MD       Current Outpatient Medications  Medication Sig Dispense Refill  . acetaminophen (TYLENOL) 650 MG CR tablet Take 1,300 mg by mouth daily as needed for  pain.    . atorvastatin (LIPITOR) 40 MG tablet Take 40 mg by mouth daily.    Marland Kitchen dexamethasone (DECADRON) 6 MG tablet Take 1 tablet (6 mg total) by mouth daily. 7 tablet 0  . diphenhydrAMINE (BENADRYL) 25 MG tablet Take 25 mg by mouth daily as needed for allergies.    Marland Kitchen gabapentin (NEURONTIN) 400 MG capsule Take 800 mg by mouth 2 (two) times daily.    Marland Kitchen glimepiride (AMARYL) 2 MG tablet Take 2 mg by mouth daily.  11  . guaiFENesin-dextromethorphan (ROBITUSSIN DM) 100-10 MG/5ML syrup Take 5 mLs by mouth every 4 (four) hours as needed for cough. 236 mL 0  . metFORMIN (GLUCOPHAGE-XR) 500 MG 24 hr tablet Take 1,000 mg by mouth 2 (two) times daily.  11  . methocarbamol (ROBAXIN) 500 MG tablet Take 1 tablet (500 mg total) by mouth every 8 (eight) hours as needed for muscle spasms. 30 tablet 0  . oxyCODONE-acetaminophen (PERCOCET/ROXICET) 5-325 MG tablet Take 1 tablet by mouth every 6 (six) hours as needed for severe pain. 14 tablet 0  . polyethylene glycol (MIRALAX) 17 g packet Take 17 g by mouth daily as needed for moderate constipation. 10 each 0  . senna-docusate (SENOKOT-S) 8.6-50 MG tablet Take 1 tablet by mouth at bedtime. 15 tablet 0  . sertraline (ZOLOFT) 50 MG tablet Take 50 mg by mouth daily.    . TRULICITY 4.5 DG/3.8VF SOPN Inject 4.5 mg into the skin once a week.      Allergies as of 10/08/2020 - Review Complete 10/08/2020  Allergen Reaction Noted  . Latex Rash  09/02/2016     Review of Systems:    All systems reviewed and negative except where noted in HPI.  Review of Systems  Constitutional: Negative for chills and fever.  Respiratory: Negative for cough.   Cardiovascular: Negative for chest pain.  Gastrointestinal: Positive for blood in stool, nausea and vomiting. Negative for abdominal pain.  Musculoskeletal: Positive for joint pain.  Skin: Negative for rash.  Neurological: Negative for dizziness.  Psychiatric/Behavioral: Negative for substance abuse.  All other systems reviewed and are negative.    Physical Exam:  Vital signs in last 24 hours: Temp:  [97.5 F (36.4 C)] 97.5 F (36.4 C) (02/21 1237) Pulse Rate:  [110-122] 110 (02/21 1416) Resp:  [9-18] 9 (02/21 1416) BP: (119-124)/(48-65) 124/65 (02/21 1416) SpO2:  [98 %-100 %] 100 % (02/21 1416)   General:   Pleasant in NAD Head:  Normocephalic and atraumatic. Eyes:   No icterus.   Conjunctiva pink. Mouth: Mucosa pink moist, no lesions. Neck:  Supple; no masses felt Lungs:  No respiratory distress Heart:  Slightly tachycardic Abdomen:   Non-distended, non-tender Msk:  No clubbing or cyanosis. Strength 5/5. Symmetrical without gross deformities. Neurologic:  Alert and  oriented x4;  Cranial nerves II-XII intact.  Skin:  Warm, dry, pink without significant lesions or rashes. Psych:  Alert and cooperative. Normal affect.  LAB RESULTS: Recent Labs    10/08/20 1220  WBC 7.2  HGB 4.6*  HCT 16.7*  PLT 336   BMET Recent Labs    10/08/20 1220  NA 129*  K 4.5  CL 99  CO2 19*  GLUCOSE 312*  BUN 29*  CREATININE 1.37*  CALCIUM 8.6*   LFT Recent Labs    10/08/20 1220  PROT 5.7*  ALBUMIN 2.9*  AST 29  ALT 25  ALKPHOS 80  BILITOT 1.3*   PT/INR No results for input(s): LABPROT, INR in the last  72 hours.  STUDIES: No results found.     Impression / Plan:   65 y/o lady with history of morbid obesity, joint replacements, and supposed thalassemia minor  here with hematochezia that's been going on for weeks but worse recently. She will need EGD/Colonoscopy for further evaluation given severe anemia. Patient stated that she would not drink prep tonight but is willing to try tomorrow night. Differential includes portal hypertensive gastropathy, avm's, hemorrhoidal, and less likely to be a malignancy. Her baseline anemia is inconsistent with thalassemia minor, would recommend investigating this further.  - two large bore IV's - transfuse to keep hemoglobin >7 - place on clear liquid diet tomorrow - maintain active type and screen - check PT/INR tomorrow - will plan for EGD/Colonoscopy on Wednesday  Please call with any questions or concerns.  Raylene Miyamoto MD, MPH [place]

## 2020-10-09 ENCOUNTER — Ambulatory Visit (HOSPITAL_COMMUNITY): Payer: Managed Care, Other (non HMO)

## 2020-10-09 ENCOUNTER — Encounter: Payer: Self-pay | Admitting: Internal Medicine

## 2020-10-09 DIAGNOSIS — K625 Hemorrhage of anus and rectum: Secondary | ICD-10-CM | POA: Diagnosis not present

## 2020-10-09 DIAGNOSIS — E1165 Type 2 diabetes mellitus with hyperglycemia: Secondary | ICD-10-CM | POA: Diagnosis not present

## 2020-10-09 DIAGNOSIS — D62 Acute posthemorrhagic anemia: Secondary | ICD-10-CM | POA: Diagnosis not present

## 2020-10-09 LAB — BASIC METABOLIC PANEL
Anion gap: 5 (ref 5–15)
BUN: 30 mg/dL — ABNORMAL HIGH (ref 8–23)
CO2: 24 mmol/L (ref 22–32)
Calcium: 7.9 mg/dL — ABNORMAL LOW (ref 8.9–10.3)
Chloride: 106 mmol/L (ref 98–111)
Creatinine, Ser: 1.48 mg/dL — ABNORMAL HIGH (ref 0.44–1.00)
GFR, Estimated: 40 mL/min — ABNORMAL LOW (ref >60)
Glucose, Bld: 161 mg/dL — ABNORMAL HIGH (ref 70–99)
Potassium: 4.7 mmol/L (ref 3.5–5.1)
Sodium: 135 mmol/L (ref 135–145)

## 2020-10-09 LAB — CBC
HCT: 20.1 % — ABNORMAL LOW (ref 36.0–46.0)
Hemoglobin: 6.3 g/dL — ABNORMAL LOW (ref 12.0–15.0)
MCH: 22.5 pg — ABNORMAL LOW (ref 26.0–34.0)
MCHC: 31.3 g/dL (ref 30.0–36.0)
MCV: 71.8 fL — ABNORMAL LOW (ref 80.0–100.0)
Platelets: 151 10*3/uL (ref 150–400)
RBC: 2.8 MIL/uL — ABNORMAL LOW (ref 3.87–5.11)
RDW: 23.6 % — ABNORMAL HIGH (ref 11.5–15.5)
WBC: 5 10*3/uL (ref 4.0–10.5)
nRBC: 0.4 % — ABNORMAL HIGH (ref 0.0–0.2)

## 2020-10-09 LAB — GLUCOSE, CAPILLARY
Glucose-Capillary: 159 mg/dL — ABNORMAL HIGH (ref 70–99)
Glucose-Capillary: 159 mg/dL — ABNORMAL HIGH (ref 70–99)
Glucose-Capillary: 124 mg/dL — ABNORMAL HIGH (ref 70–99)
Glucose-Capillary: 112 mg/dL — ABNORMAL HIGH (ref 70–99)

## 2020-10-09 LAB — HEMOGLOBIN AND HEMATOCRIT, BLOOD
HCT: 21.2 % — ABNORMAL LOW (ref 36.0–46.0)
HCT: 23.7 % — ABNORMAL LOW (ref 36.0–46.0)
Hemoglobin: 6.5 g/dL — ABNORMAL LOW (ref 12.0–15.0)
Hemoglobin: 7.3 g/dL — ABNORMAL LOW (ref 12.0–15.0)

## 2020-10-09 LAB — SARS CORONAVIRUS 2 (TAT 6-24 HRS): SARS Coronavirus 2: POSITIVE — AB

## 2020-10-09 LAB — PREPARE RBC (CROSSMATCH)

## 2020-10-09 MED ORDER — PEG 3350-KCL-NA BICARB-NACL 420 G PO SOLR
4000.0000 mL | Freq: Once | ORAL | Status: AC
  Administered 2020-10-09: 4000 mL via ORAL
  Filled 2020-10-09: qty 4000

## 2020-10-09 MED ORDER — SODIUM CHLORIDE 0.9% IV SOLUTION: Freq: Once | INTRAVENOUS | Status: DC

## 2020-10-09 NOTE — Care Plan (Signed)
Plan for EGD/Colonoscopy tomorrow. Prep ordered. NPO after midnight except for prep and clear liquids. Please make sure hemoglobin is > 7 and electrolytes are replete.  Raylene Miyamoto MD, MPH [place]
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
## 2020-10-10 ENCOUNTER — Other Ambulatory Visit: Payer: Self-pay

## 2020-10-10 ENCOUNTER — Inpatient Hospital Stay: Payer: Managed Care, Other (non HMO) | Admitting: Certified Registered Nurse Anesthetist

## 2020-10-10 ENCOUNTER — Encounter (HOSPITAL_COMMUNITY): Payer: 59

## 2020-10-10 ENCOUNTER — Encounter
Admission: EM | Disposition: A | Discharge status: Home / Self Care | Payer: Self-pay | Source: Home / Self Care | Attending: Internal Medicine

## 2020-10-10 ENCOUNTER — Encounter: Payer: Self-pay | Admitting: Internal Medicine

## 2020-10-10 DIAGNOSIS — D62 Acute posthemorrhagic anemia: Secondary | ICD-10-CM | POA: Diagnosis not present

## 2020-10-10 HISTORY — PX: COLONOSCOPY WITH PROPOFOL: SHX5780

## 2020-10-10 HISTORY — PX: ESOPHAGOGASTRODUODENOSCOPY (EGD) WITH PROPOFOL: SHX5813

## 2020-10-10 LAB — TYPE AND SCREEN
ABO/RH(D): A POS
Antibody Screen: NEGATIVE
Unit division: 0
Unit division: 0
Unit division: 0
Unit division: 0

## 2020-10-10 LAB — BPAM RBC
Blood Product Expiration Date: 202203222359
Blood Product Expiration Date: 202203222359
Blood Product Expiration Date: 202203222359
Blood Product Expiration Date: 202203222359
ISSUE DATE / TIME: 202202211744
ISSUE DATE / TIME: 202202212233
ISSUE DATE / TIME: 202202220135
ISSUE DATE / TIME: 202202221452
Unit Type and Rh: 6200
Unit Type and Rh: 6200
Unit Type and Rh: 6200
Unit Type and Rh: 6200

## 2020-10-10 LAB — BASIC METABOLIC PANEL
Anion gap: 7 (ref 5–15)
BUN: 25 mg/dL — ABNORMAL HIGH (ref 8–23)
CO2: 20 mmol/L — ABNORMAL LOW (ref 22–32)
Calcium: 7.6 mg/dL — ABNORMAL LOW (ref 8.9–10.3)
Chloride: 106 mmol/L (ref 98–111)
Creatinine, Ser: 1.43 mg/dL — ABNORMAL HIGH (ref 0.44–1.00)
GFR, Estimated: 41 mL/min — ABNORMAL LOW (ref >60)
Glucose, Bld: 109 mg/dL — ABNORMAL HIGH (ref 70–99)
Potassium: 4.3 mmol/L (ref 3.5–5.1)
Sodium: 133 mmol/L — ABNORMAL LOW (ref 135–145)

## 2020-10-10 LAB — CBC WITH DIFFERENTIAL/PLATELET
Abs Immature Granulocytes: 0.02 10*3/uL (ref 0.00–0.07)
Basophils Absolute: 0 10*3/uL (ref 0.0–0.1)
Basophils Relative: 0 %
Eosinophils Absolute: 0.3 10*3/uL (ref 0.0–0.5)
Eosinophils Relative: 5 %
HCT: 25.4 % — ABNORMAL LOW (ref 36.0–46.0)
Hemoglobin: 7.6 g/dL — ABNORMAL LOW (ref 12.0–15.0)
Immature Granulocytes: 0 %
Lymphocytes Relative: 31 %
Lymphs Abs: 1.6 10*3/uL (ref 0.7–4.0)
MCH: 22.8 pg — ABNORMAL LOW (ref 26.0–34.0)
MCHC: 29.9 g/dL — ABNORMAL LOW (ref 30.0–36.0)
MCV: 76.3 fL — ABNORMAL LOW (ref 80.0–100.0)
Monocytes Absolute: 0.6 10*3/uL (ref 0.1–1.0)
Monocytes Relative: 10 %
Neutro Abs: 2.8 10*3/uL (ref 1.7–7.7)
Neutrophils Relative %: 54 %
Platelets: 149 10*3/uL — ABNORMAL LOW (ref 150–400)
RBC: 3.33 MIL/uL — ABNORMAL LOW (ref 3.87–5.11)
RDW: 25.2 % — ABNORMAL HIGH (ref 11.5–15.5)
WBC: 5.3 10*3/uL (ref 4.0–10.5)
nRBC: 0.6 % — ABNORMAL HIGH (ref 0.0–0.2)

## 2020-10-10 LAB — GLUCOSE, CAPILLARY
Glucose-Capillary: 147 mg/dL — ABNORMAL HIGH (ref 70–99)
Glucose-Capillary: 152 mg/dL — ABNORMAL HIGH (ref 70–99)
Glucose-Capillary: 152 mg/dL — ABNORMAL HIGH (ref 70–99)
Glucose-Capillary: 111 mg/dL — ABNORMAL HIGH (ref 70–99)
Glucose-Capillary: 182 mg/dL — ABNORMAL HIGH (ref 70–99)

## 2020-10-10 SURGERY — ESOPHAGOGASTRODUODENOSCOPY (EGD) WITH PROPOFOL
Anesthesia: General

## 2020-10-10 MED ORDER — LIDOCAINE HCL (CARDIAC) PF 100 MG/5ML IV SOSY
PREFILLED_SYRINGE | INTRAVENOUS | Status: DC | PRN
  Administered 2020-10-10: 100 mg via INTRAVENOUS

## 2020-10-10 MED ORDER — PROPOFOL 500 MG/50ML IV EMUL
INTRAVENOUS | Status: DC | PRN
  Administered 2020-10-10: 160 ug/kg/min via INTRAVENOUS

## 2020-10-10 MED ORDER — PHENYLEPHRINE HCL (PRESSORS) 10 MG/ML IV SOLN
INTRAVENOUS | Status: DC | PRN
  Administered 2020-10-10 (×4): 200 ug via INTRAVENOUS
  Administered 2020-10-10 (×2): 100 ug via INTRAVENOUS

## 2020-10-10 MED ORDER — PROPOFOL 10 MG/ML IV BOLUS
INTRAVENOUS | Status: DC | PRN
  Administered 2020-10-10: 50 mg via INTRAVENOUS

## 2020-10-10 MED ORDER — PROPOFOL 10 MG/ML IV BOLUS
INTRAVENOUS | Status: AC
  Filled 2020-10-10: qty 20

## 2020-10-10 NOTE — Progress Notes (Signed)
Mobility Specialist - Progress Note   10/10/20 1400  Mobility  Activity Off unit  Mobility performed by Mobility specialist    Pt off-unit at time of arrival. Pt with EGD/Colonoscopy procedure this date. Will attempt session at another date/time as medically appropriate.    Kathee Delton Mobility Specialist 10/10/20, 2:44 PM
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
## 2020-10-10 NOTE — Anesthesia Preprocedure Evaluation (Addendum)
Anesthesia Evaluation  Patient identified by MRN, date of birth, ID band Patient awake    Reviewed: Allergy & Precautions, NPO status , Patient's Chart, lab work & pertinent test results  History of Anesthesia Complications Negative for: history of anesthetic complications  Airway Mallampati: III  TM Distance: >3 FB Neck ROM: Full    Dental  (+) Poor Dentition   Pulmonary shortness of breath and with exertion, sleep apnea and Continuous Positive Airway Pressure Ventilation , pneumonia, resolved, neg COPD,    breath sounds clear to auscultation- rhonchi (-) wheezing      Cardiovascular Exercise Tolerance: Good hypertension, Pt. on medications (-) CAD, (-) Past MI, (-) Cardiac Stents and (-) CABG  Rhythm:Regular Rate:Normal - Systolic murmurs and - Diastolic murmurs    Neuro/Psych PSYCHIATRIC DISORDERS Anxiety Depression negative neurological ROS     GI/Hepatic GERD  ,(+) Hepatitis -  Endo/Other  diabetes, Oral Hypoglycemic Agents  Renal/GU Renal disease     Musculoskeletal  (+) Arthritis ,   Abdominal (+) + obese,   Peds  Hematology  (+) anemia ,   Anesthesia Other Findings Acute blood loss anemia Diabetes mellitus with hyperglycemia (HCC)   Hypertension   Depression   Hyponatremia   Rectal bleeding  Anxiety Arthritis Diabetes mellitus without complication  Dyspnea GERD Hepatitis     Comment:  PAST Hypertension Neuropathy Neuropathy, diabetic (HCC) Pneumonia Sinus complaint Sleep apnea     Comment:  CPAP Thalassemia minor   Reproductive/Obstetrics                             Anesthesia Physical  Anesthesia Plan  ASA: III  Anesthesia Plan: General   Post-op Pain Management:    Induction: Intravenous  PONV Risk Score and Plan: 2 and Propofol infusion and TIVA  Airway Management Planned: Natural Airway and Nasal Cannula  Additional Equipment:   Intra-op Plan:    Post-operative Plan:   Informed Consent: I have reviewed the patients History and Physical, chart, labs and discussed the procedure including the risks, benefits and alternatives for the proposed anesthesia with the patient or authorized representative who has indicated his/her understanding and acceptance.       Plan Discussed with: CRNA, Anesthesiologist and Surgeon  Anesthesia Plan Comments:        Anesthesia Quick Evaluation

## 2020-10-10 NOTE — Anesthesia Postprocedure Evaluation (Signed)
Anesthesia Post Note  Patient: Rachel Huber  Procedure(s) Performed: ESOPHAGOGASTRODUODENOSCOPY (EGD) WITH PROPOFOL (N/A ) COLONOSCOPY WITH PROPOFOL (N/A )  Patient location during evaluation: Phase II Anesthesia Type: General Level of consciousness: awake and alert, awake and oriented Pain management: pain level controlled Vital Signs Assessment: post-procedure vital signs reviewed and stable Respiratory status: spontaneous breathing, nonlabored ventilation and respiratory function stable Cardiovascular status: blood pressure returned to baseline and stable Postop Assessment: no apparent nausea or vomiting Anesthetic complications: no   No complications documented.   Last Vitals:  Vitals:   10/10/20 1501 10/10/20 1507  BP: 104/60 104/63  Pulse: 90 89  Resp: 15 15  Temp: (!) 36.1 C   SpO2: 98% 99%    Last Pain:  Vitals:   10/10/20 1513  TempSrc:   PainSc: 7                  Phill Mutter

## 2020-10-10 NOTE — Care Plan (Signed)
EGD/Colonoscopy performed. Findings concerning for portal hypertensive gastropathy on upper but on lower she has grade IV hemorrhoids which will need surgical evaluation as an outpatient. Will need PO iron and bowel regimen to make sure she does not get constipated. Will need outpatient GI evaluation for potential liver cirrhosis as well. Please call with any questions or concerns.  Raylene Miyamoto MD, MPH [place]

## 2020-10-10 NOTE — Transfer of Care (Signed)
Immediate Anesthesia Transfer of Care Note  Patient: Rachel Huber  Procedure(s) Performed: ESOPHAGOGASTRODUODENOSCOPY (EGD) WITH PROPOFOL (N/A ) COLONOSCOPY WITH PROPOFOL (N/A )  Patient Location: Endoscopy Unit  Anesthesia Type:General  Level of Consciousness: drowsy, patient cooperative and pateint uncooperative  Airway & Oxygen Therapy: Patient Spontanous Breathing and Patient connected to nasal cannula oxygen  Post-op Assessment: Report given to RN and Post -op Vital signs reviewed and stable  Post vital signs: Reviewed and stable  Last Vitals:  Vitals Value Taken Time  BP 89/55 10/10/20 1457  Temp    Pulse 95 10/10/20 1458  Resp 13 10/10/20 1458  SpO2 96 % 10/10/20 1458  Vitals shown include unvalidated device data.  Last Pain:  Vitals:   10/10/20 1149  TempSrc: Oral  PainSc:          Complications: No complications documented.

## 2020-10-10 NOTE — Op Note (Signed)
[facility] Gastroenterology Patient Name: Rachel Huber Procedure Date: 10/10/2020 2:01 PM MRN: [id number] Account #: [id number] Date of Birth: 12/16/1955 Admit Type: Outpatient Age: 65 Room: [facility] ENDO ROOM 2 Gender: Female Note Status: Finalized Procedure:             Colonoscopy Indications:           Hematochezia Providers:             Andrey Farmer MD, MD Medicines:             Monitored Anesthesia Care Complications:         No immediate complications. Estimated blood loss:                         Minimal. Procedure:             Pre-Anesthesia Assessment:                        - Prior to the procedure, a History and Physical was                         performed, and patient medications and allergies were                         reviewed. The patient is competent. The risks and                         benefits of the procedure and the sedation options and                         risks were discussed with the patient. All questions                         were answered and informed consent was obtained.                         Patient identification and proposed procedure were                         verified by the physician, the nurse, the anesthetist                         and the technician in the endoscopy suite. Mental                         Status Examination: alert and oriented. Airway                         Examination: normal oropharyngeal airway and neck                         mobility. Respiratory Examination: clear to                         auscultation. CV Examination: normal. Prophylactic                         Antibiotics: The patient does not require prophylactic  antibiotics. Prior Anticoagulants: The patient has                         taken no previous anticoagulant or antiplatelet                         agents. ASA Grade Assessment: III - A patient with                         severe systemic disease. After  reviewing the risks and                         benefits, the patient was deemed in satisfactory                         condition to undergo the procedure. The anesthesia                         plan was to use monitored anesthesia care (MAC).                         Immediately prior to administration of medications,                         the patient was re-assessed for adequacy to receive                         sedatives. The heart rate, respiratory rate, oxygen                         saturations, blood pressure, adequacy of pulmonary                         ventilation, and response to care were monitored                         throughout the procedure. The physical status of the                         patient was re-assessed after the procedure.                        After obtaining informed consent, the colonoscope was                         passed under direct vision. Throughout the procedure,                         the patient's blood pressure, pulse, and oxygen                         saturations were monitored continuously. The                         Colonoscope was introduced through the anus and                         advanced to the the cecum, identified by appendiceal  orifice and ileocecal valve. The colonoscopy was                         technically difficult and complex due to a redundant                         colon. The patient tolerated the procedure well. The                         quality of the bowel preparation was poor. Findings:      The perianal and digital rectal examinations were normal.      A 2 mm polyp was found in the cecum. The polyp was sessile. The polyp       was removed with a jumbo cold forceps. Resection and retrieval were       complete. Estimated blood loss was minimal.      A 6 mm polyp was found in the cecum. The polyp was sessile. The polyp       was removed with a cold snare. Resection and retrieval were  complete.       Estimated blood loss was minimal.      An area of significantly congested mucosa was found in the rectum and in       the sigmoid colon. Biopsies were taken with a cold forceps for       histology. Estimated blood loss was minimal.      Non-bleeding internal hemorrhoids were found during retroflexion and       during perianal exam. The hemorrhoids were Grade IV (internal       hemorrhoids that prolapse and cannot be reduced manually). Impression:            - Preparation of the colon was poor.                        - One 2 mm polyp in the cecum, removed with a jumbo                         cold forceps. Resected and retrieved.                        - One 6 mm polyp in the cecum, removed with a cold                         snare. Resected and retrieved.                        - Congested mucosa in the rectum and in the sigmoid                         colon. Biopsied.                        - Non-bleeding internal hemorrhoids. Recommendation:        - Return patient to hospital ward for ongoing care.                        - Resume previous diet.                        -  Continue present medications.                        - Await pathology results.                        - Refer to a surgeon at appointment to be scheduled. Procedure Code(s):     --- Professional ---                        [phone], Colonoscopy, flexible; with removal of                         tumor(s), polyp(s), or other lesion(s) by snare                         technique                        45380, 63, Colonoscopy, flexible; with biopsy, single                         or multiple Diagnosis Code(s):     --- Professional ---                        K64.3, Fourth degree hemorrhoids                        K63.5, Polyp of colon                        K62.89, Other specified diseases of anus and rectum                        K63.89, Other specified diseases of intestine                        K92.1, Melena  (includes Hematochezia) CPT copyright 2019 American [employer] Association. All rights reserved. The codes documented in this report are preliminary and upon coder review may  be revised to meet current compliance requirements. Andrey Farmer MD, MD 10/10/2020 3:07:36 PM Number of Addenda: 0 Note Initiated On: 10/10/2020 2:01 PM Scope Withdrawal Time: 0 hours 13 minutes 54 seconds  Total Procedure Duration: 0 hours 31 minutes 25 seconds  Estimated Blood Loss:  Estimated blood loss was minimal.      [facility]

## 2020-10-10 NOTE — Op Note (Addendum)
[facility] Gastroenterology Patient Name: Rachel Huber Procedure Date: 10/10/2020 2:01 PM MRN: [id number] Account #: [id number] Date of Birth: [birth year] Admit Type: Outpatient Age: 65 Room: [facility] ENDO ROOM 2 Gender: Female Note Status: Supervisor Override Procedure:             Upper GI endoscopy Indications:           Cirrhosis Providers:             Andrey Farmer MD, MD Medicines:             Monitored Anesthesia Care Complications:         No immediate complications. Estimated blood loss:                         Minimal. Procedure:             Pre-Anesthesia Assessment:                        - Prior to the procedure, a History and Physical was                         performed, and patient medications and allergies were                         reviewed. The patient is competent. The risks and                         benefits of the procedure and the sedation options and                         risks were discussed with the patient. All questions                         were answered and informed consent was obtained.                         Patient identification and proposed procedure were                         verified by the physician, the nurse, the anesthetist                         and the technician in the endoscopy suite. Mental                         Status Examination: alert and oriented. Airway                         Examination: normal oropharyngeal airway and neck                         mobility. Respiratory Examination: clear to                         auscultation. CV Examination: normal. Prophylactic                         Antibiotics: The patient does not require prophylactic  antibiotics. Prior Anticoagulants: The patient has                         taken no previous anticoagulant or antiplatelet                         agents. ASA Grade Assessment: III - A patient with                         severe systemic  disease. After reviewing the risks and                         benefits, the patient was deemed in satisfactory                         condition to undergo the procedure. The anesthesia                         plan was to use monitored anesthesia care (MAC).                         Immediately prior to administration of medications,                         the patient was re-assessed for adequacy to receive                         sedatives. The heart rate, respiratory rate, oxygen                         saturations, blood pressure, adequacy of pulmonary                         ventilation, and response to care were monitored                         throughout the procedure. The physical status of the                         patient was re-assessed after the procedure.                        After obtaining informed consent, the endoscope was                         passed under direct vision. Throughout the procedure,                         the patient's blood pressure, pulse, and oxygen                         saturations were monitored continuously. The Endoscope                         was introduced through the mouth, and advanced to the                         second part of duodenum. The upper GI endoscopy was  accomplished without difficulty. The patient tolerated                         the procedure well. Findings:      The examined esophagus was normal.      Diffuse severe mucosal variance characterized by granularity was found       in the entire examined stomach. Appeared almost as if portal       hypertensive gastropathy. Biopsies were taken with a cold forceps for       histology. Estimated blood loss was minimal.      A scar was found in the gastric antrum.      Localized mildly erythematous mucosa without active bleeding and with no       stigmata of bleeding was found in the duodenal bulb. Impression:            - Normal esophagus.                         - Gastric mucosal variant. Biopsied.                        - Erythematous duodenopathy. Recommendation:        - Perform a colonoscopy today. Will need to be                         evaluated as an outpatient for portal hypertension. Procedure Code(s):     --- Professional ---                        [phone], Esophagogastroduodenoscopy, flexible,                         transoral; with biopsy, single or multiple Diagnosis Code(s):     --- Professional ---                        K31.89, Other diseases of stomach and duodenum                        K92.1, Melena (includes Hematochezia) CPT copyright 2019 American Medical Association. All rights reserved. The codes documented in this report are preliminary and upon coder review may  be revised to meet current compliance requirements. Andrey Farmer MD, MD 10/10/2020 3:00:24 PM Number of Addenda: 0 Note Initiated On: 10/10/2020 2:01 PM Estimated Blood Loss:  Estimated blood loss was minimal.      [facility]

## 2020-10-11 ENCOUNTER — Encounter: Payer: Self-pay | Admitting: Gastroenterology

## 2020-10-11 DIAGNOSIS — E1165 Type 2 diabetes mellitus with hyperglycemia: Secondary | ICD-10-CM | POA: Diagnosis not present

## 2020-10-11 DIAGNOSIS — D62 Acute posthemorrhagic anemia: Secondary | ICD-10-CM | POA: Diagnosis not present

## 2020-10-11 DIAGNOSIS — K625 Hemorrhage of anus and rectum: Secondary | ICD-10-CM | POA: Diagnosis not present

## 2020-10-11 DIAGNOSIS — K643 Fourth degree hemorrhoids: Secondary | ICD-10-CM | POA: Diagnosis not present

## 2020-10-11 LAB — GLUCOSE, CAPILLARY
Glucose-Capillary: 139 mg/dL — ABNORMAL HIGH (ref 70–99)
Glucose-Capillary: 201 mg/dL — ABNORMAL HIGH (ref 70–99)

## 2020-10-11 MED ORDER — POLYETHYLENE GLYCOL 3350 17 G PO PACK: 17.0000 g | PACK | Freq: Every day | ORAL | 0 refills | Status: DC

## 2020-10-11 MED ORDER — PANTOPRAZOLE SODIUM 40 MG PO TBEC
40.0000 mg | DELAYED_RELEASE_TABLET | Freq: Every day | ORAL | Status: DC
  Administered 2020-10-11: 40 mg via ORAL
  Filled 2020-10-11: qty 1

## 2020-10-11 MED ORDER — HYDROCORTISONE ACETATE 25 MG RE SUPP: 25.0000 mg | Freq: Two times a day (BID) | RECTAL | 1 refills | Status: DC

## 2020-10-11 MED ORDER — PANTOPRAZOLE SODIUM 40 MG PO TBEC: 40.0000 mg | DELAYED_RELEASE_TABLET | Freq: Every day | ORAL | 1 refills | Status: DC

## 2020-10-11 MED ORDER — HYDROCORTISONE ACETATE 25 MG RE SUPP
25.0000 mg | Freq: Two times a day (BID) | RECTAL | Status: DC
  Administered 2020-10-11: 25 mg via RECTAL
  Filled 2020-10-11 (×2): qty 1

## 2020-10-11 NOTE — Progress Notes (Addendum)
Patient given instructions to make all appointments, when to return for worsening symptoms, IV and tele discontinued, medications reviewed, & patient to be discharged with all belongings.

## 2020-10-11 NOTE — Discharge Instructions (Addendum)
 [employer] medicine (25th ed., pp. [phone]). [place], [place]: Elsevier.">  Anemia  Anemia is a condition in which there is not enough red blood cells or hemoglobin in the blood. Hemoglobin is a substance in red blood cells that carries oxygen. When you do not have enough red blood cells or hemoglobin (are anemic), your body cannot get enough oxygen and your organs may not work properly. As a result, you may feel very tired or have other problems. What are the causes? Common causes of anemia include:  Excessive bleeding. Anemia can be caused by excessive bleeding inside or outside the body, including bleeding from the intestines or from heavy menstrual periods in females.  Poor nutrition.  Long-lasting (chronic) kidney, thyroid, and liver disease.  Bone marrow disorders, spleen problems, and blood disorders.  Cancer and treatments for cancer.  HIV (human immunodeficiency virus) and AIDS (acquired immunodeficiency syndrome).  Infections, medicines, and autoimmune disorders that destroy red blood cells. What are the signs or symptoms? Symptoms of this condition include:  Minor weakness.  Dizziness.  Headache, or difficulties concentrating and sleeping.  Heartbeats that feel irregular or faster than normal (palpitations).  Shortness of breath, especially with exercise.  Pale skin, lips, and nails, or cold hands and feet.  Indigestion and nausea. Symptoms may occur suddenly or develop slowly. If your anemia is mild, you may not have symptoms. How is this diagnosed? This condition is diagnosed based on blood tests, your medical history, and a physical exam. In some cases, a test may be needed in which cells are removed from the soft tissue inside of a bone and looked at under a microscope (bone marrow biopsy). Your health care provider may also check your stool (feces) for blood and may do additional testing to look for the cause of your bleeding. Other tests may  include:  Imaging tests, such as a CT scan or MRI.  A procedure to see inside your esophagus and stomach (endoscopy).  A procedure to see inside your colon and rectum (colonoscopy). How is this treated? Treatment for this condition depends on the cause. If you continue to lose a lot of blood, you may need to be treated at a hospital. Treatment may include:  Taking supplements of iron, vitamin B12, or folic acid.  Taking a hormone medicine (erythropoietin) that can help to stimulate red blood cell growth.  Having a blood transfusion. This may be needed if you lose a lot of blood.  Making changes to your diet.  Having surgery to remove your spleen. Follow these instructions at home:  Take over-the-counter and prescription medicines only as told by your health care provider.  Take supplements only as told by your health care provider.  Follow any diet instructions that you were given by your health care provider.  Keep all follow-up visits as told by your health care provider. This is important. Contact a health care provider if:  You develop new bleeding anywhere in the body. Get help right away if:  You are very weak.  You are short of breath.  You have pain in your abdomen or chest.  You are dizzy or feel faint.  You have trouble concentrating.  You have bloody stools, black stools, or tarry stools.  You vomit repeatedly or you vomit up blood. These symptoms may represent a serious problem that is an emergency. Do not wait to see if the symptoms will go away. Get medical help right away. Call your local emergency services (911 in the [place] Do   not drive yourself to the hospital. Summary  Anemia is a condition in which you do not have enough red blood cells or enough of a substance in your red blood cells that carries oxygen (hemoglobin).  Symptoms may occur suddenly or develop slowly.  If your anemia is mild, you may not have symptoms.  This condition is  diagnosed with blood tests, a medical history, and a physical exam. Other tests may be needed.  Treatment for this condition depends on the cause of the anemia. This information is not intended to replace advice given to you by your health care provider. Make sure you discuss any questions you have with your health care provider.  **Take stool softeners and miralax daily to avoid constipation Use Anusol  suppository or preparation-H over-the-counter for your hemorrhoids**

## 2020-10-11 NOTE — Progress Notes (Signed)
Patient discharged via personal car with husband
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
## 2020-10-11 NOTE — Plan of Care (Signed)

## 2020-10-12 LAB — SURGICAL PATHOLOGY

## 2020-10-16 ENCOUNTER — Ambulatory Visit (HOSPITAL_COMMUNITY): Payer: Managed Care, Other (non HMO)

## 2020-10-16 ENCOUNTER — Telehealth (HOSPITAL_COMMUNITY): Payer: Self-pay

## 2020-10-16 NOTE — Telephone Encounter (Signed)
pt cancelled appt for today because she is still weak from being in the hospital

## 2020-10-19 ENCOUNTER — Encounter (HOSPITAL_COMMUNITY): Payer: Self-pay

## 2020-10-19 ENCOUNTER — Other Ambulatory Visit: Payer: Self-pay

## 2020-10-19 ENCOUNTER — Ambulatory Visit (HOSPITAL_COMMUNITY): Payer: Managed Care, Other (non HMO) | Attending: Internal Medicine

## 2020-10-19 DIAGNOSIS — M6281 Muscle weakness (generalized): Secondary | ICD-10-CM | POA: Insufficient documentation

## 2020-10-19 DIAGNOSIS — R262 Difficulty in walking, not elsewhere classified: Secondary | ICD-10-CM | POA: Diagnosis present
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
## 2020-10-23 ENCOUNTER — Ambulatory Visit (HOSPITAL_COMMUNITY): Payer: Managed Care, Other (non HMO)

## 2020-10-23 ENCOUNTER — Other Ambulatory Visit: Payer: Self-pay

## 2020-10-23 DIAGNOSIS — R262 Difficulty in walking, not elsewhere classified: Secondary | ICD-10-CM

## 2020-10-23 DIAGNOSIS — M6281 Muscle weakness (generalized): Secondary | ICD-10-CM

## 2020-10-23 NOTE — Therapy (Signed)
[place], [place], [address] Phone: [phone]   Fax:  [phone]  Physical Therapy Treatment and Discharge Summary  Patient Details  Name: Rachel Huber MRN: [id number] Date of Birth: October 12, 1955 Referring Provider (PT): Dr. Frazier Richards   Encounter Date: 10/23/2020   PT End of Session - 10/23/20 1354    Visit Number 4    Number of Visits 12    Date for PT Re-Evaluation 11/06/20    Authorization Type Generic [employer],    Authorization Time Period VL based on medical necessity, 60 total    Authorization - Visit Number 4    Authorization - Number of Visits 60    Progress Note Due on Visit 10    PT Start Time 1345    PT Stop Time 1420   pt fatigue and not feeling well   PT Time Calculation (min) 35 min    Activity Tolerance Patient tolerated treatment well    Behavior During Therapy [facility] for tasks assessed/performed           Past Medical History:  Diagnosis Date  . Anxiety   . Arthritis   . Diabetes mellitus without complication ([place])   . Dyspnea    DOE  . GERD (gastroesophageal reflux disease)   . Hepatitis    PAST  . Hypertension   . Neuropathy   . Neuropathy, diabetic ([place])   . Pneumonia   . Sinus complaint   . Sleep apnea    CPAP  . Thalassemia minor   . Thalassemia minor     Past Surgical History:  Procedure Laterality Date  . ABDOMINAL HYSTERECTOMY    . BREAST BIOPSY Right 02/15/2018   [place] bx 6-6:30 ribbon shape, ONE CORE FRAGMENT WITH FIBROSIS. ONE CORE FRAGMENT WITH PORTION OF A DILATED  . BREAST BIOPSY Right 02/15/2018   [place] bx 9:00 heart shape, USUAL DUCTAL HYPERPLASIA  . BREAST LUMPECTOMY Right 03/09/2018   Procedure: BREAST LUMPECTOMY x 2;  Surgeon: Benjamine Sprague, DO;  Location: [facility] ORS;  Service: General;  Laterality: Right;  . CATARACT EXTRACTION W/PHACO Left 06/11/2016   Procedure: CATARACT EXTRACTION PHACO AND INTRAOCULAR LENS PLACEMENT (IOC);  Surgeon: Estill Cotta,  MD;  Location: [facility] ORS;  Service: Ophthalmology;  Laterality: Left;  Lot # [id number] H US:01:38.6 AP%:26.4 CDE:44.15  . CATARACT EXTRACTION W/PHACO Right 06/15/2018   Procedure: CATARACT EXTRACTION PHACO AND INTRAOCULAR LENS PLACEMENT (IOC);  Surgeon: Birder Robson, MD;  Location: [facility] ORS;  Service: Ophthalmology;  Laterality: Right;  [place] 00:38.2 CDE 4.23 Fluid Pack Lot # [id number]  . COLONOSCOPIES    . COLONOSCOPY WITH PROPOFOL N/A 10/10/2020   Procedure: COLONOSCOPY WITH PROPOFOL;  Surgeon: Lesly Rubenstein, MD;  Location: [facility] ENDOSCOPY;  Service: Endoscopy;  Laterality: N/A;  . CTR    . ESOPHAGOGASTRODUODENOSCOPY (EGD) WITH PROPOFOL N/A 10/10/2020   Procedure: ESOPHAGOGASTRODUODENOSCOPY (EGD) WITH PROPOFOL;  Surgeon: Lesly Rubenstein, MD;  Location: [facility] ENDOSCOPY;  Service: Endoscopy;  Laterality: N/A;  COVID POSITIVE 10/08/2020  . JOINT REPLACEMENT    . KNEE SURGERY Right 09/24/2017   plates and pins  . TOTAL SHOULDER ARTHROPLASTY Right 09/27/2015   PHYSICAL THERAPY DISCHARGE SUMMARY  Visits from Start of Care 4  Current functional level related to goals / functional outcomes: Patient able to initiated ambulation with cane. Able to meet 2/4 STG and 0/2 LTG   Remaining deficits: Patient has activity tolerance deficits at this time due to exacerbation of medical conditions    Education /  Equipment: Comprehensive HEP Plan: Patient agrees to discharge.  Patient goals were not met. Patient is being discharged due to the patient's request.  ?????     \ There were no vitals filed for this visit.   Subjective Assessment - 10/23/20 1421    Subjective Patient reports continued difficulty with energy and exertion due to recent blood loss anemia and requests hold services at this time due to demands of leaving home and travel. Pt reports not feeling well generally and exhibits shaking and tremor with exertion requiring frequent rest periods              [facility] PT  Assessment - 10/23/20 0001      Assessment   Medical Diagnosis Right greater trochanter fracture, closed fracture    Referring Provider (PT) Dr. Frazier Richards    Onset Date/Surgical Date 09/01/20                         [facility] PT Treatment/Exercise - 10/23/20 0001      Ambulation/Gait   Ambulation/Gait Yes    Ambulation/Gait Assistance 6: Modified independent (Device/Increase time)    Ambulation Distance (Feet) 50 Feet    Assistive device Straight cane    Gait Pattern Step-through pattern;Decreased weight shift to right                  PT Education - 10/23/20 1422    Education Details Pt educated on benefits of continued, regular activity at home and therapist developed comprehensive HEP for seated and supine exercises to improve activity tolerance    Person(s) Educated Patient    Methods Explanation;Demonstration;Handout    Comprehension Verbalized understanding;Returned demonstration            PT Short Term Goals - 10/23/20 1425      PT SHORT TERM GOAL #1   Title Patient will report at least 50% improvement in overall symptoms and function to demonstrate overall improved functional ability    Baseline 25% improvement in right hip    Time 3    Period Weeks    Status Not Met    Target Date 10/16/20      PT SHORT TERM GOAL #2   Title Patient will decrease level of assistance to modified independent for bed mobility    Baseline min A; modified indepenendt    Time 3    Period Weeks    Status Achieved    Target Date 10/16/20      PT SHORT TERM GOAL #3   Title Demo improved gait tolerance as evidenced by distance of 150 ft during 2MWT    Baseline DNT due to fatigue    Time 3    Period Weeks    Status Deferred    Target Date 10/16/20      PT SHORT TERM GOAL #4   Title Patient will be independent with HEP in order to improve functional outcomes.    Baseline w/ comprehensive handout    Time 3    Period Weeks    Status Achieved     Target Date 10/16/20             PT Long Term Goals - 10/23/20 1427      PT LONG TERM GOAL #1   Title Patient will improve on FOTO score to meet predicted outcomes to improve functional independence    Baseline 44% function. DNT    Time 6    Period Weeks  Status Unable to assess      PT LONG TERM GOAL #2   Title Demonstrate normalized gait pattern with least restrictive AD x 350 ft during 2MWT    Baseline 80 ft wi RW    Time 6    Period Weeks    Status Not Met                 Plan - 10/23/20 1424    Clinical Impression Statement Patient presents with continued fatigue and reduced activity tolerance due to recent medical conditions and hospitalization.  Patient reports that her trips to therapy are too demanding at this time and she would like to D/C to home-program to continue at her own pace and ability.    Personal Factors and Comorbidities Comorbidity 1;Time since onset of injury/illness/exacerbation;Past/Current Experience    Comorbidities PMH and hx of mutiple physical trauma    Examination-Activity Limitations Bathing;Bed Mobility;Bend;Lift;Toileting;Stand;Squat;Locomotion Level;Transfers    Examination-Participation Restrictions Cleaning;Community Activity;Driving;Shop;Occupation    Stability/Clinical Decision Making Stable/Uncomplicated    Rehab Potential Excellent    PT Frequency 2x / week    PT Duration 6 weeks    PT Treatment/Interventions ADLs/Self Care Home Management;Aquatic Therapy;Biofeedback;Cryotherapy;Electrical Stimulation;DME Instruction;Ultrasound;Moist Heat;Gait training;Stair training;Functional mobility training;Therapeutic activities;Therapeutic exercise;Balance training;Patient/family education;Neuromuscular re-education;Manual techniques;Taping;Energy conservation;Dry needling;Spinal Manipulations;Joint Manipulations    PT Next Visit Plan Continue HEP for LE strength, avoid active abduction and painful right hip resistance    PT Home Exercise  Plan QS, GS, ball squeeze           Patient will benefit from skilled therapeutic intervention in order to improve the following deficits and impairments:  Abnormal gait,Decreased activity tolerance,Decreased balance,Decreased mobility,Decreased knowledge of use of DME,Decreased endurance,Decreased range of motion,Decreased strength,Difficulty walking,Impaired perceived functional ability,Improper body mechanics,Pain  Visit Diagnosis: Muscle weakness (generalized)  Difficulty in walking, not elsewhere classified     Problem List Patient Active Problem List   Diagnosis Date Noted  . Grade IV internal hemorrhoids   . Acute blood loss anemia 10/08/2020  . Hyponatremia 10/08/2020  . Rectal bleeding 10/08/2020  . Acute respiratory failure with hypoxia ([place]) 09/10/2020  . Pneumonia due to COVID-19 virus 09/08/2020  . Hip fracture, right ([place]) 09/04/2020  . Closed right hip fracture ([place]) 09/02/2020  . Diabetes mellitus with hyperglycemia ([place]) 09/02/2020  . Hypertension   . Thalassemia minor   . Pancytopenia ([place])   . HLD (hyperlipidemia)   . Acute renal failure superimposed on stage 3a chronic kidney disease ([place])   . Depression   . Fall at home, initial encounter   . Nondisplaced fracture of greater trochanter of right femur, initial encounter for closed fracture ([place])    2:30 PM, 10/23/20 M. Sherlyn Lees, PT, DPT Physical Therapist- [place] Office Number: [phone]  [facility] [address] [place], [place], [address] Phone: [phone]   Fax:  [phone]  Name: Rachel Huber MRN: [id number] Date of Birth: [birth year]

## 2020-10-25 ENCOUNTER — Emergency Department: Payer: Managed Care, Other (non HMO)

## 2020-10-25 ENCOUNTER — Inpatient Hospital Stay
Admission: EM | Admit: 2020-10-25 | Discharge: 2020-10-30 | DRG: 433 | Disposition: A | Discharge status: Home / Self Care | Payer: Managed Care, Other (non HMO) | Attending: Internal Medicine | Admitting: Internal Medicine

## 2020-10-25 ENCOUNTER — Other Ambulatory Visit: Payer: Self-pay

## 2020-10-25 ENCOUNTER — Ambulatory Visit (HOSPITAL_COMMUNITY): Payer: Managed Care, Other (non HMO)

## 2020-10-25 DIAGNOSIS — E1165 Type 2 diabetes mellitus with hyperglycemia: Secondary | ICD-10-CM | POA: Diagnosis not present

## 2020-10-25 DIAGNOSIS — R188 Other ascites: Secondary | ICD-10-CM | POA: Diagnosis present

## 2020-10-25 DIAGNOSIS — E1122 Type 2 diabetes mellitus with diabetic chronic kidney disease: Secondary | ICD-10-CM | POA: Diagnosis present

## 2020-10-25 DIAGNOSIS — G4733 Obstructive sleep apnea (adult) (pediatric): Secondary | ICD-10-CM | POA: Diagnosis present

## 2020-10-25 DIAGNOSIS — N179 Acute kidney failure, unspecified: Secondary | ICD-10-CM | POA: Diagnosis present

## 2020-10-25 DIAGNOSIS — Z8616 Personal history of COVID-19: Secondary | ICD-10-CM

## 2020-10-25 DIAGNOSIS — K140 Glossitis: Secondary | ICD-10-CM | POA: Diagnosis present

## 2020-10-25 DIAGNOSIS — I129 Hypertensive chronic kidney disease with stage 1 through stage 4 chronic kidney disease, or unspecified chronic kidney disease: Secondary | ICD-10-CM | POA: Diagnosis present

## 2020-10-25 DIAGNOSIS — R0600 Dyspnea, unspecified: Secondary | ICD-10-CM

## 2020-10-25 DIAGNOSIS — Z91040 Latex allergy status: Secondary | ICD-10-CM | POA: Diagnosis not present

## 2020-10-25 DIAGNOSIS — R8271 Bacteriuria: Secondary | ICD-10-CM | POA: Diagnosis not present

## 2020-10-25 DIAGNOSIS — N3090 Cystitis, unspecified without hematuria: Secondary | ICD-10-CM

## 2020-10-25 DIAGNOSIS — D563 Thalassemia minor: Secondary | ICD-10-CM | POA: Diagnosis present

## 2020-10-25 DIAGNOSIS — K7460 Unspecified cirrhosis of liver: Secondary | ICD-10-CM

## 2020-10-25 DIAGNOSIS — N1831 Chronic kidney disease, stage 3a: Secondary | ICD-10-CM | POA: Diagnosis present

## 2020-10-25 DIAGNOSIS — K7290 Hepatic failure, unspecified without coma: Secondary | ICD-10-CM | POA: Diagnosis present

## 2020-10-25 DIAGNOSIS — K625 Hemorrhage of anus and rectum: Secondary | ICD-10-CM | POA: Diagnosis not present

## 2020-10-25 DIAGNOSIS — E1169 Type 2 diabetes mellitus with other specified complication: Secondary | ICD-10-CM | POA: Diagnosis present

## 2020-10-25 DIAGNOSIS — M1990 Unspecified osteoarthritis, unspecified site: Secondary | ICD-10-CM | POA: Diagnosis present

## 2020-10-25 DIAGNOSIS — I10 Essential (primary) hypertension: Secondary | ICD-10-CM | POA: Diagnosis not present

## 2020-10-25 DIAGNOSIS — Z96611 Presence of right artificial shoulder joint: Secondary | ICD-10-CM | POA: Diagnosis present

## 2020-10-25 DIAGNOSIS — Z803 Family history of malignant neoplasm of breast: Secondary | ICD-10-CM | POA: Diagnosis not present

## 2020-10-25 DIAGNOSIS — K219 Gastro-esophageal reflux disease without esophagitis: Secondary | ICD-10-CM | POA: Diagnosis present

## 2020-10-25 DIAGNOSIS — R601 Generalized edema: Secondary | ICD-10-CM | POA: Diagnosis present

## 2020-10-25 DIAGNOSIS — D509 Iron deficiency anemia, unspecified: Secondary | ICD-10-CM | POA: Diagnosis present

## 2020-10-25 DIAGNOSIS — K7469 Other cirrhosis of liver: Secondary | ICD-10-CM | POA: Diagnosis present

## 2020-10-25 DIAGNOSIS — E1140 Type 2 diabetes mellitus with diabetic neuropathy, unspecified: Secondary | ICD-10-CM | POA: Diagnosis present

## 2020-10-25 DIAGNOSIS — Z7984 Long term (current) use of oral hypoglycemic drugs: Secondary | ICD-10-CM

## 2020-10-25 DIAGNOSIS — E119 Type 2 diabetes mellitus without complications: Secondary | ICD-10-CM | POA: Diagnosis present

## 2020-10-25 DIAGNOSIS — R0609 Other forms of dyspnea: Secondary | ICD-10-CM

## 2020-10-25 DIAGNOSIS — Z79899 Other long term (current) drug therapy: Secondary | ICD-10-CM

## 2020-10-25 DIAGNOSIS — N189 Chronic kidney disease, unspecified: Secondary | ICD-10-CM | POA: Diagnosis present

## 2020-10-25 LAB — COMPREHENSIVE METABOLIC PANEL
ALT: 24 U/L (ref 0–44)
AST: 32 U/L (ref 15–41)
Albumin: 2.9 g/dL — ABNORMAL LOW (ref 3.5–5.0)
Alkaline Phosphatase: 97 U/L (ref 38–126)
Anion gap: 10 (ref 5–15)
BUN: 28 mg/dL — ABNORMAL HIGH (ref 8–23)
CO2: 18 mmol/L — ABNORMAL LOW (ref 22–32)
Calcium: 8.7 mg/dL — ABNORMAL LOW (ref 8.9–10.3)
Chloride: 108 mmol/L (ref 98–111)
Creatinine, Ser: 1.23 mg/dL — ABNORMAL HIGH (ref 0.44–1.00)
GFR, Estimated: 50 mL/min — ABNORMAL LOW (ref >60)
Glucose, Bld: 183 mg/dL — ABNORMAL HIGH (ref 70–99)
Potassium: 4.6 mmol/L (ref 3.5–5.1)
Sodium: 136 mmol/L (ref 135–145)
Total Bilirubin: 1.3 mg/dL — ABNORMAL HIGH (ref 0.3–1.2)
Total Protein: 6.2 g/dL — ABNORMAL LOW (ref 6.5–8.1)

## 2020-10-25 LAB — URINALYSIS, COMPLETE (UACMP) WITH MICROSCOPIC
Bilirubin Urine: NEGATIVE
Glucose, UA: NEGATIVE mg/dL
Ketones, ur: NEGATIVE mg/dL
Nitrite: POSITIVE — AB
Protein, ur: NEGATIVE mg/dL
Specific Gravity, Urine: 1.017 (ref 1.005–1.030)
pH: 5 (ref 5.0–8.0)

## 2020-10-25 LAB — CBC
HCT: 28.7 % — ABNORMAL LOW (ref 36.0–46.0)
Hemoglobin: 8.1 g/dL — ABNORMAL LOW (ref 12.0–15.0)
MCH: 19.5 pg — ABNORMAL LOW (ref 26.0–34.0)
MCHC: 28.2 g/dL — ABNORMAL LOW (ref 30.0–36.0)
MCV: 69 fL — ABNORMAL LOW (ref 80.0–100.0)
Platelets: 294 10*3/uL (ref 150–400)
RBC: 4.16 MIL/uL (ref 3.87–5.11)
RDW: 28.4 % — ABNORMAL HIGH (ref 11.5–15.5)
WBC: 8.3 10*3/uL (ref 4.0–10.5)
nRBC: 0 % (ref 0.0–0.2)

## 2020-10-25 LAB — T4, FREE: Free T4: 1.46 ng/dL — ABNORMAL HIGH (ref 0.61–1.12)

## 2020-10-25 LAB — TSH: TSH: 1.793 u[IU]/mL (ref 0.350–4.500)

## 2020-10-25 LAB — BRAIN NATRIURETIC PEPTIDE: B Natriuretic Peptide: 25 pg/mL (ref 0.0–100.0)

## 2020-10-25 LAB — LIPASE, BLOOD: Lipase: 44 U/L (ref 11–51)

## 2020-10-25 LAB — TROPONIN I (HIGH SENSITIVITY): Troponin I (High Sensitivity): 7 ng/L (ref <18)

## 2020-10-25 LAB — MAGNESIUM: Magnesium: 2 mg/dL (ref 1.7–2.4)

## 2020-10-25 MED ORDER — FUROSEMIDE 10 MG/ML IJ SOLN
40.0000 mg | Freq: Once | INTRAMUSCULAR | Status: AC
  Administered 2020-10-25: 40 mg via INTRAVENOUS
  Filled 2020-10-25: qty 4

## 2020-10-25 MED ORDER — PANTOPRAZOLE SODIUM 40 MG PO TBEC
40.0000 mg | DELAYED_RELEASE_TABLET | Freq: Every day | ORAL | Status: DC
  Administered 2020-10-26 – 2020-10-30 (×5): 40 mg via ORAL
  Filled 2020-10-25 (×5): qty 1

## 2020-10-25 MED ORDER — FERROUS SULFATE 325 (65 FE) MG PO TABS
325.0000 mg | ORAL_TABLET | Freq: Every day | ORAL | Status: DC
  Administered 2020-10-26 – 2020-10-30 (×5): 325 mg via ORAL
  Filled 2020-10-25 (×5): qty 1

## 2020-10-25 MED ORDER — GABAPENTIN 400 MG PO CAPS
800.0000 mg | ORAL_CAPSULE | Freq: Two times a day (BID) | ORAL | Status: DC
  Administered 2020-10-26 – 2020-10-30 (×10): 800 mg via ORAL
  Filled 2020-10-25 (×10): qty 2

## 2020-10-25 MED ORDER — INSULIN ASPART 100 UNIT/ML ~~LOC~~ SOLN
0.0000 [IU] | Freq: Three times a day (TID) | SUBCUTANEOUS | Status: DC
  Administered 2020-10-26 – 2020-10-27 (×2): 2 [IU] via SUBCUTANEOUS
  Administered 2020-10-27: 1 [IU] via SUBCUTANEOUS
  Administered 2020-10-28 – 2020-10-29 (×3): 2 [IU] via SUBCUTANEOUS
  Administered 2020-10-29: 3 [IU] via SUBCUTANEOUS
  Filled 2020-10-25 (×5): qty 1

## 2020-10-25 MED ORDER — FUROSEMIDE 10 MG/ML IJ SOLN
40.0000 mg | Freq: Every day | INTRAMUSCULAR | Status: DC
  Administered 2020-10-26: 40 mg via INTRAVENOUS
  Filled 2020-10-25: qty 4

## 2020-10-25 MED ORDER — IOHEXOL 350 MG/ML SOLN
75.0000 mL | Freq: Once | INTRAVENOUS | Status: AC | PRN
  Administered 2020-10-25: 75 mL via INTRAVENOUS

## 2020-10-25 MED ORDER — SERTRALINE HCL 50 MG PO TABS
50.0000 mg | ORAL_TABLET | Freq: Every day | ORAL | Status: DC
  Administered 2020-10-26 – 2020-10-30 (×5): 50 mg via ORAL
  Filled 2020-10-25 (×5): qty 1

## 2020-10-25 MED ORDER — HYDROCORTISONE ACETATE 25 MG RE SUPP
25.0000 mg | Freq: Two times a day (BID) | RECTAL | Status: DC
  Filled 2020-10-25 (×11): qty 1

## 2020-10-25 MED ORDER — MELATONIN 5 MG PO TABS: 5.0000 mg | ORAL_TABLET | Freq: Every evening | ORAL | Status: DC | PRN

## 2020-10-25 MED ORDER — INSULIN ASPART 100 UNIT/ML ~~LOC~~ SOLN
0.0000 [IU] | Freq: Every day | SUBCUTANEOUS | Status: DC
  Administered 2020-10-27 – 2020-10-29 (×2): 2 [IU] via SUBCUTANEOUS
  Filled 2020-10-25 (×2): qty 1

## 2020-10-25 MED ORDER — SODIUM CHLORIDE 0.9 % IV SOLN
1.0000 g | Freq: Once | INTRAVENOUS | Status: AC
  Administered 2020-10-25: 1 g via INTRAVENOUS
  Filled 2020-10-25: qty 10

## 2020-10-25 MED ORDER — POLYETHYLENE GLYCOL 3350 17 G PO PACK
17.0000 g | PACK | Freq: Every day | ORAL | Status: DC
  Administered 2020-10-27: 17 g via ORAL
  Filled 2020-10-25 (×4): qty 1

## 2020-10-25 NOTE — ED Provider Notes (Signed)
[facility] Emergency Department Provider Note  ____________________________________________  Time seen: Approximately 9:19 PM  I have reviewed the triage vital signs and the nursing notes.   HISTORY  Chief Complaint Abdominal Pain and Leg Swelling    HPI ASUSENA SIGLEY is a 65 y.o. female with a history of diabetes, hypertension, thalassemia minor, obesity sent to the ED by her gynecologist due to generalized abdominal swelling and bilateral leg swelling for the past 2 weeks, gradual onset and worsening.   Denies chest pain but does endorse dyspnea on exertion.  Uses a CPAP at night and has not noticed any change in her sleep habits.  Denies history of heart failure cirrhosis or kidney failure.  Not a habitual drinker.  Does report gait being given Lasix in the past but because she works third shift and sleeps during the daytime, taking Lasix was too disruptive to her schedule.     Past Medical History:  Diagnosis Date  . Anxiety   . Arthritis   . Diabetes mellitus without complication ([place])   . Dyspnea    DOE  . GERD (gastroesophageal reflux disease)   . Hepatitis    PAST  . Hypertension   . Neuropathy   . Neuropathy, diabetic ([place])   . Pneumonia   . Sinus complaint   . Sleep apnea    CPAP  . Thalassemia minor   . Thalassemia minor      Patient Active Problem List   Diagnosis Date Noted  . Grade IV internal hemorrhoids   . Acute blood loss anemia 10/08/2020  . Hyponatremia 10/08/2020  . Rectal bleeding 10/08/2020  . Acute respiratory failure with hypoxia ([place]) 09/10/2020  . Pneumonia due to COVID-19 virus 09/08/2020  . Hip fracture, right ([place]) 09/04/2020  . Closed right hip fracture ([place]) 09/02/2020  . Diabetes mellitus with hyperglycemia ([place]) 09/02/2020  . Hypertension   . Thalassemia minor   . Pancytopenia ([place])   . HLD (hyperlipidemia)   . Acute renal failure superimposed on stage 3a chronic kidney disease ([place])   . Depression    . Fall at home, initial encounter   . Nondisplaced fracture of greater trochanter of right femur, initial encounter for closed fracture [facility])      Past Surgical History:  Procedure Laterality Date  . ABDOMINAL HYSTERECTOMY    . BREAST BIOPSY Right 02/15/2018   [place] bx 6-6:30 ribbon shape, ONE CORE FRAGMENT WITH FIBROSIS. ONE CORE FRAGMENT WITH PORTION OF A DILATED  . BREAST BIOPSY Right 02/15/2018   [place] bx 9:00 heart shape, USUAL DUCTAL HYPERPLASIA  . BREAST LUMPECTOMY Right 03/09/2018   Procedure: BREAST LUMPECTOMY x 2;  Surgeon: Benjamine Sprague, DO;  Location: [facility] ORS;  Service: General;  Laterality: Right;  . CATARACT EXTRACTION W/PHACO Left 06/11/2016   Procedure: CATARACT EXTRACTION PHACO AND INTRAOCULAR LENS PLACEMENT (IOC);  Surgeon: Estill Cotta, MD;  Location: [facility] ORS;  Service: Ophthalmology;  Laterality: Left;  Lot # [id number] H US:01:38.6 AP%:26.4 CDE:44.15  . CATARACT EXTRACTION W/PHACO Right 06/15/2018   Procedure: CATARACT EXTRACTION PHACO AND INTRAOCULAR LENS PLACEMENT (IOC);  Surgeon: Birder Robson, MD;  Location: [facility] ORS;  Service: Ophthalmology;  Laterality: Right;  [place] 00:38.2 CDE 4.23 Fluid Pack Lot # [id number]  . COLONOSCOPIES    . COLONOSCOPY WITH PROPOFOL N/A 10/10/2020   Procedure: COLONOSCOPY WITH PROPOFOL;  Surgeon: Lesly Rubenstein, MD;  Location: [facility] ENDOSCOPY;  Service: Endoscopy;  Laterality: N/A;  . CTR    . ESOPHAGOGASTRODUODENOSCOPY (EGD) WITH PROPOFOL N/A 10/10/2020  Procedure: ESOPHAGOGASTRODUODENOSCOPY (EGD) WITH PROPOFOL;  Surgeon: Lesly Rubenstein, MD;  Location: [facility] ENDOSCOPY;  Service: Endoscopy;  Laterality: N/A;  COVID POSITIVE 10/08/2020  . JOINT REPLACEMENT    . KNEE SURGERY Right 09/24/2017   plates and pins  . TOTAL SHOULDER ARTHROPLASTY Right 09/27/2015     Prior to Admission medications   Medication Sig Start Date End Date Taking? Authorizing Provider  acetaminophen (TYLENOL) 650 MG CR tablet Take 1,300 mg by mouth daily  as needed for pain.    [provider]  atorvastatin (LIPITOR) 40 MG tablet Take 40 mg by mouth daily.    [provider]  diphenhydrAMINE (BENADRYL) 25 MG tablet Take 25 mg by mouth at bedtime.    [provider]  gabapentin (NEURONTIN) 400 MG capsule Take 800 mg by mouth 2 (two) times daily. 01/07/18   [provider]  glimepiride (AMARYL) 2 MG tablet Take 2 mg by mouth daily. 01/04/18   [provider]  guaiFENesin-dextromethorphan (ROBITUSSIN DM) 100-10 MG/5ML syrup Take 5 mLs by mouth every 4 (four) hours as needed for cough. 09/13/20   Aline August, MD  hydrocortisone (ANUSOL-HC) 25 MG suppository Place 1 suppository (25 mg total) rectally 2 (two) times daily. 10/11/20   Fritzi Mandes, MD  melatonin 5 MG TABS Take 5 mg by mouth at bedtime as needed (sleep).    [provider]  metFORMIN (GLUCOPHAGE-XR) 500 MG 24 hr tablet Take 1,000 mg by mouth every evening.    [provider]  methocarbamol (ROBAXIN) 500 MG tablet Take 1 tablet (500 mg total) by mouth every 8 (eight) hours as needed for muscle spasms. 09/13/20   Aline August, MD  Multiple Vitamins-Minerals (MULTIVITAMIN WITH MINERALS) tablet Take 1 tablet by mouth daily.    [provider]  oxyCODONE-acetaminophen (PERCOCET/ROXICET) 5-325 MG tablet Take 1 tablet by mouth every 6 (six) hours as needed for severe pain. 09/13/20   Aline August, MD  pantoprazole (PROTONIX) 40 MG tablet Take 1 tablet (40 mg total) by mouth daily. 10/11/20   Fritzi Mandes, MD  polyethylene glycol (MIRALAX) 17 g packet Take 17 g by mouth daily. 10/11/20   Fritzi Mandes, MD  sertraline (ZOLOFT) 50 MG tablet Take 50 mg by mouth daily. 12/12/17   [provider]  TRULICITY 4.5 WP/8.0DX SOPN Inject 4.5 mg into the skin every Wednesday.    [provider]     Allergies Latex   Family History  Problem Relation Age of Onset  . Breast cancer Mother 75  . Breast cancer Sister 108     Social History Social History   Tobacco Use  . Smoking status: Never Smoker  . Smokeless tobacco: Never Used  Vaping Use  . Vaping Use: Never used  Substance Use Topics  . Alcohol use: No  . Drug use: Never    Review of Systems  Constitutional:   No fever or chills.  ENT:   No sore throat. No rhinorrhea. Cardiovascular:   No chest pain or syncope. Respiratory: Positive shortness of breath and cough. Gastrointestinal:   Negative for abdominal pain, vomiting and diarrhea.  Musculoskeletal: Positive leg swelling All other systems reviewed and are negative except as documented above in ROS and HPI.  ____________________________________________   PHYSICAL EXAM:  VITAL SIGNS: ED Triage Vitals [10/25/20 1548]  Enc Vitals Group     BP 125/66     Pulse Rate (!) 107     Resp 18     Temp 98.2 F (36.8 C)  Temp src      SpO2 100 %     Weight      Height      Head Circumference      Peak Flow      Pain Score 3     Pain Loc      Pain Edu?      Excl. in [place]?     Vital signs reviewed, nursing assessments reviewed.   Constitutional:   Alert and oriented. Non-toxic appearance. Eyes:   Conjunctivae are normal. EOMI. PERRL. ENT      Head:   Normocephalic and atraumatic.      Nose:   Wearing a mask.      Mouth/Throat:   Wearing a mask.      Neck:   No meningismus. Full ROM.  Positive JVD Hematological/Lymphatic/Immunilogical:   No cervical lymphadenopathy. Cardiovascular:   Tachycardia heart rate 105. Symmetric bilateral radial and DP pulses.  No murmurs. Cap refill less than 2 seconds. Respiratory:   Normal respiratory effort without tachypnea/retractions. Breath sounds are clear and equal bilaterally. No wheezes/rales/rhonchi. Gastrointestinal:   Soft and nontender. Non distended. There is no CVA tenderness.  No rebound, rigidity, or guarding.  Diffuse ascites and abdominal wall edema Genitourinary:   deferred Musculoskeletal:   Normal range of motion in all  extremities. No joint effusions.  No lower extremity tenderness.  1+ bilateral pitting edema. Neurologic:   Normal speech and language.  Motor grossly intact. No acute focal neurologic deficits are appreciated.  Skin:    Skin is warm, dry and intact. No rash noted.  No petechiae, purpura, or bullae.  ____________________________________________    LABS (pertinent positives/negatives) (all labs ordered are listed, but only abnormal results are displayed) Labs Reviewed  COMPREHENSIVE METABOLIC PANEL - Abnormal; Notable for the following components:      Result Value   CO2 18 (*)    Glucose, Bld 183 (*)    BUN 28 (*)    Creatinine, Ser 1.23 (*)    Calcium 8.7 (*)    Total Protein 6.2 (*)    Albumin 2.9 (*)    Total Bilirubin 1.3 (*)    GFR, Estimated 50 (*)    All other components within normal limits  CBC - Abnormal; Notable for the following components:   Hemoglobin 8.1 (*)    HCT 28.7 (*)    MCV 69.0 (*)    MCH 19.5 (*)    MCHC 28.2 (*)    RDW 28.4 (*)    All other components within normal limits  URINALYSIS, COMPLETE (UACMP) WITH MICROSCOPIC - Abnormal; Notable for the following components:   Color, Urine YELLOW (*)    APPearance HAZY (*)    Hgb urine dipstick SMALL (*)    Nitrite POSITIVE (*)    Leukocytes,Ua SMALL (*)    Bacteria, UA MANY (*)    All other components within normal limits  T4, FREE - Abnormal; Notable for the following components:   Free T4 1.46 (*)    All other components within normal limits  URINE CULTURE  LIPASE, BLOOD  MAGNESIUM  TSH  BRAIN NATRIURETIC PEPTIDE  TROPONIN I (HIGH SENSITIVITY)   ____________________________________________   EKG    ____________________________________________    RADIOLOGY  CT Angio Chest PE W and/or Wo Contrast  Result Date: 10/25/2020 CLINICAL DATA:  Abdominal and bilateral leg swelling. EXAM: CT ANGIOGRAPHY CHEST WITH CONTRAST TECHNIQUE: Multidetector CT imaging of the chest was performed using  the standard protocol during bolus administration  of intravenous contrast. Multiplanar CT image reconstructions and MIPs were obtained to evaluate the vascular anatomy. CONTRAST:  47m OMNIPAQUE IOHEXOL 350 MG/ML SOLN COMPARISON:  September 21, 2017 FINDINGS: Cardiovascular: There is moderate severity calcification of the aortic arch without evidence of aneurysmal dilatation or dissection. The subsegmental pulmonary arteries are mildly limited in evaluation due to suboptimal opacification with intravenous contrast. No evidence of pulmonary embolism. Normal heart size. No pericardial effusion. Mediastinum/Nodes: No enlarged mediastinal, hilar, or axillary lymph nodes. Thyroid gland, trachea, and esophagus demonstrate no significant findings. Lungs/Pleura: Very mild areas of atelectasis are seen within the posterior aspect of the bilateral upper lobes and bilateral lower lobes. A 3 mm calcified lung nodule is seen within the lateral aspect of the right upper lobe. There is no evidence of acute infiltrate, pleural effusion or pneumothorax. Upper Abdomen: The liver is cirrhotic in appearance. A marked amount of ascites is seen within the abdomen Musculoskeletal: A metallic density right shoulder replacement is noted. Chronic anterolateral fourth, fifth and sixth left rib fractures are seen. Degenerative changes seen throughout the thoracic spine. Review of the MIP images confirms the above findings. IMPRESSION: 1. No evidence of pulmonary embolism. 2. Very mild bilateral upper lobe and bilateral lower lobe atelectasis. 3. Cirrhotic liver with a marked amount of ascites. Aortic Atherosclerosis (ICD10-I70.0). Electronically Signed   By: TVirgina NorfolkM.D.   On: 10/25/2020 18:46   CT ABDOMEN PELVIS W CONTRAST  Result Date: 10/25/2020 CLINICAL DATA:  Abdominal and bilateral leg swelling. EXAM: CT ABDOMEN AND PELVIS WITH CONTRAST TECHNIQUE: Multidetector CT imaging of the abdomen and pelvis was performed using the  standard protocol following bolus administration of intravenous contrast. CONTRAST:  727mOMNIPAQUE IOHEXOL 350 MG/ML SOLN COMPARISON:  None. FINDINGS: Lower chest: A mild atelectasis is seen within the bilateral lung bases. Hepatobiliary: The liver is cirrhotic in appearance. No focal liver abnormality is seen. Numerous gallstones and gallbladder sludge are seen within the lumen of an otherwise normal-appearing gallbladder. Pancreas: Unremarkable. No pancreatic ductal dilatation or surrounding inflammatory changes. Spleen: There is mild to moderate severity splenomegaly. A 1.8 cm diameter cyst is noted within the posterior aspect of the spleen. Adrenals/Urinary Tract: Adrenal glands are unremarkable. Kidneys are normal, without renal calculi, focal lesion, or hydronephrosis. Bladder is unremarkable. Stomach/Bowel: There is a very small hiatal hernia. Appendix appears normal. No evidence of bowel wall thickening, distention, or inflammatory changes. Vascular/Lymphatic: Aortic atherosclerosis. No enlarged abdominal or pelvic lymph nodes. Reproductive: Status post hysterectomy. No adnexal masses. Other: Moderate severity anasarca is seen involving the walls of the lower abdomen and pelvis. There is a large amount of abdominal and pelvic free fluid. Musculoskeletal: Multilevel degenerative changes seen throughout the lumbar spine. IMPRESSION: 1. Cirrhosis with a large amount of ascites. 2. Cholelithiasis without evidence of cholecystitis. 3. Moderate severity anasarca. 4. Very small hiatal hernia. 5. Aortic atherosclerosis. Aortic Atherosclerosis (ICD10-I70.0). Electronically Signed   By: ThVirgina Norfolk.D.   On: 10/25/2020 18:57    ____________________________________________   PROCEDURES Procedures  ____________________________________________  DIFFERENTIAL DIAGNOSIS   Cirrhosis, kidney failure, heart failure, hypoalbuminemia, electrolyte abnormality, hypothyroidism  CLINICAL IMPRESSION /  ASSESSMENT AND PLAN / ED COURSE  Medications ordered in the ED: Medications  cefTRIAXone (ROCEPHIN) 1 g in sodium chloride 0.9 % 100 mL IVPB (has no administration in time range)  furosemide (LASIX) injection 40 mg (40 mg Intravenous Given 10/25/20 1742)  iohexol (OMNIPAQUE) 350 MG/ML injection 75 mL (75 mLs Intravenous Contrast Given 10/25/20 1823)    Pertinent labs &  imaging results that were available during my care of the patient were reviewed by me and considered in my medical decision making (see chart for details).  [name] was evaluated in Emergency Department on 10/25/2020 for the symptoms described in the history of present illness. She was evaluated in the context of the global COVID-19 pandemic, which necessitated consideration that the patient might be at risk for infection with the SARS-CoV-2 virus that causes COVID-19. Institutional protocols and algorithms that pertain to the evaluation of patients at risk for COVID-19 are in a state of rapid change based on information released by regulatory bodies including the CDC and federal and state organizations. These policies and algorithms were followed during the patient's care in the ED.   Patient presents with diffuse edema.  Vital signs unremarkable except for mild tachycardia.  Labs show stable chronic anemia, chronic mild CKD.  Troponin normal, BNP normal, TSH normal.  Urinalysis consistent with UTI.  CT chest abdomen pelvis negative for PE or intrathoracic pathology.  Suggestive of cirrhosis with ascites.  Patient given Lasix and is having good urine output.  Will discuss with hospitalist for further work-up of cirrhosis and anasarca, symptomatic management.      ____________________________________________   FINAL CLINICAL IMPRESSION(S) / ED DIAGNOSES    Final diagnoses:  Cirrhosis of liver with ascites, unspecified hepatic cirrhosis type ([place])  Anasarca  DOE (dyspnea on exertion)     ED Discharge Orders     None      Portions of this note were generated with dragon dictation software. Dictation errors may occur despite best attempts at proofreading.   Carrie Mew, MD 10/25/20 2126

## 2020-10-25 NOTE — ED Triage Notes (Signed)
Pt comes with c/o swollen abdominal and bilateral leg swelling.  Pt states she went to [facility] for physical and was informed to come here.  Pt states this started couple weeks ago. Pt states she noticed the legs were tight yesterday and then swelling today.

## 2020-10-25 NOTE — ED Notes (Signed)
Patient transported to CT 

## 2020-10-25 NOTE — H&P (Addendum)
History and Physical    Rachel Huber BZJ:[id number] DOB: 13-Oct-1955 DOA: 10/25/2020  PCP: [name], MD  Patient coming from: Home  I have personally briefly reviewed patient's old medical records in [place]  Chief Complaint: abdominal distention and LE edema  HPI: Rachel Huber is a 65 y.o. female with medical history significant for rectal bleeding presumed to be from a hemorrhoids, hypertension, hyperlipidemia, diabetes mellitus, CKD-3, GERD, depression, anxiety, thalassemia minor, hepatitis B, neuropathy, OSA on CPAP,  recent COVID-19 pneumonia, recent nondisplaced right greater trochanteric hip fracture that was managed conservatively in January.  She was recently hospitalized from 2/21-2/24 for severe acute on chronic blood loss anemia requiring up to 4 units of PRBC transfusion.  She had both EGD and colonoscopy with several polyps removed.  EGD concerning for portal hypertension gastropathy. Also noted to have AKI at that time that was stable with IV fluids.  For the past 2 weeks since her last discharge she has noted gradual increase abdominal distention and tightening of the skin on her abdominal. Has early satiety and unable to eat much. For the past two days she has also noted edema of both legs up to her thigh. Has dyspnea on exertion but no PND or orthopnea. No chest pain. No fevers. No confusion. She had one episode of bright red blood with bowel movement since last discharge but no further episodes. No dark stools.   CT abdominal now with concerns of cirrhosis with ascites. Mild cirrhosis was already noted on a CT abdomen in 2019.   She denies alcohol use but mentions that in her 49s she had hepatitis B and was treated symptomatically. Had titers later that was reportedly normal.  No family hx of liver or autoimmune disease.   Her UA is positive but she denies any dysuria but has noted decrease urine output.   ED Course: She was HDS and afebrile. Hgb  stable at 8.1 from previous of around 7. Mild elevated creatinine at 1.23 but improved since her last admission where she had AKI.  LFTs are normal.  Normal platelets.  Review of Systems: Constitutional: No Weight Change, No Fever ENT/Mouth: No sore throat, No Rhinorrhea Eyes: No Eye Pain, No Vision Changes Cardiovascular: No Chest Pain, no SOB, No PND, + Dyspnea on Exertion, No Orthopnea, + Edema, No Palpitations Respiratory: No Cough, No Sputum, No Wheezing, no Dyspnea  Gastrointestinal: No Nausea, No Vomiting, No Diarrhea, No Constipation, No Pain Genitourinary: no Urinary Incontinence, No Urgency, No Flank Pain Musculoskeletal: No Arthralgias, No Myalgias Skin: No Skin Lesions, No Pruritus, Neuro: no Weakness, No Numbness,  Psych: No Anxiety/Panic, No Depression, no decrease appetite Heme/Lymph: No Bruising, No Bleeding  Past Medical History:  Diagnosis Date  . Anxiety   . Arthritis   . Diabetes mellitus without complication ([place])   . Dyspnea    DOE  . GERD (gastroesophageal reflux disease)   . Hepatitis    PAST  . Hypertension   . Neuropathy   . Neuropathy, diabetic ([place])   . Pneumonia   . Sinus complaint   . Sleep apnea    CPAP  . Thalassemia minor   . Thalassemia minor     Past Surgical History:  Procedure Laterality Date  . ABDOMINAL HYSTERECTOMY    . BREAST BIOPSY Right 02/15/2018   [place] bx 6-6:30 ribbon shape, ONE CORE FRAGMENT WITH FIBROSIS. ONE CORE FRAGMENT WITH PORTION OF A DILATED  . BREAST BIOPSY Right 02/15/2018   [place] bx 9:00  heart shape, USUAL DUCTAL HYPERPLASIA  . BREAST LUMPECTOMY Right 03/09/2018   Procedure: BREAST LUMPECTOMY x 2;  Surgeon: Benjamine Sprague, DO;  Location: [facility] ORS;  Service: General;  Laterality: Right;  . CATARACT EXTRACTION W/PHACO Left 06/11/2016   Procedure: CATARACT EXTRACTION PHACO AND INTRAOCULAR LENS PLACEMENT (IOC);  Surgeon: Estill Cotta, MD;  Location: [facility] ORS;  Service: Ophthalmology;  Laterality: Left;  Lot #  [id number] H US:01:38.6 AP%:26.4 CDE:44.15  . CATARACT EXTRACTION W/PHACO Right 06/15/2018   Procedure: CATARACT EXTRACTION PHACO AND INTRAOCULAR LENS PLACEMENT (IOC);  Surgeon: Birder Robson, MD;  Location: [facility] ORS;  Service: Ophthalmology;  Laterality: Right;  [place] 00:38.2 CDE 4.23 Fluid Pack Lot # [id number]  . COLONOSCOPIES    . COLONOSCOPY WITH PROPOFOL N/A 10/10/2020   Procedure: COLONOSCOPY WITH PROPOFOL;  Surgeon: Lesly Rubenstein, MD;  Location: [facility] ENDOSCOPY;  Service: Endoscopy;  Laterality: N/A;  . CTR    . ESOPHAGOGASTRODUODENOSCOPY (EGD) WITH PROPOFOL N/A 10/10/2020   Procedure: ESOPHAGOGASTRODUODENOSCOPY (EGD) WITH PROPOFOL;  Surgeon: Lesly Rubenstein, MD;  Location: [facility] ENDOSCOPY;  Service: Endoscopy;  Laterality: N/A;  COVID POSITIVE 10/08/2020  . JOINT REPLACEMENT    . KNEE SURGERY Right 09/24/2017   plates and pins  . TOTAL SHOULDER ARTHROPLASTY Right 09/27/2015     reports that she has never smoked. She has never used smokeless tobacco. She reports that she does not drink alcohol and does not use drugs. Social History  Allergies  Allergen Reactions  . Latex Rash    Contact rash    Family History  Problem Relation Age of Onset  . Breast cancer Mother 62  . Breast cancer Sister 32     Prior to Admission medications   Medication Sig Start Date End Date Taking? Authorizing Provider  atorvastatin (LIPITOR) 40 MG tablet Take 40 mg by mouth daily.   Yes [provider]  collagenase (SANTYL) ointment Apply 1 application topically daily.   Yes [provider]  diphenhydrAMINE (BENADRYL) 25 MG tablet Take 25 mg by mouth at bedtime.   Yes [provider]  Ferrous Sulfate (IRON PO) Take 1 tablet by mouth daily.   Yes [provider]  gabapentin (NEURONTIN) 400 MG capsule Take 800 mg by mouth 2 (two) times daily. 01/07/18  Yes [provider]  glimepiride (AMARYL) 2 MG tablet Take 2 mg by mouth daily. 01/04/18  Yes  [provider]  hydrocortisone (ANUSOL-HC) 25 MG suppository Place 1 suppository (25 mg total) rectally 2 (two) times daily. 10/11/20  Yes Fritzi Mandes, MD  lisinopril-hydrochlorothiazide (ZESTORETIC) 20-25 MG tablet Take 1 tablet by mouth daily.   Yes [provider]  metFORMIN (GLUCOPHAGE-XR) 500 MG 24 hr tablet Take 1,000 mg by mouth every evening.   Yes [provider]  Multiple Vitamins-Minerals (MULTIVITAMIN WITH MINERALS) tablet Take 1 tablet by mouth daily.   Yes [provider]  oxymetazoline (AFRIN) 0.05 % nasal spray Place 1 spray into both nostrils 2 (two) times daily.   Yes [provider]  pantoprazole (PROTONIX) 40 MG tablet Take 1 tablet (40 mg total) by mouth daily. 10/11/20  Yes Fritzi Mandes, MD  polyethylene glycol (MIRALAX) 17 g packet Take 17 g by mouth daily. 10/11/20  Yes Fritzi Mandes, MD  sertraline (ZOLOFT) 50 MG tablet Take 50 mg by mouth daily. 12/12/17  Yes [provider]  TRULICITY 4.5 KZ/6.0FU SOPN Inject 4.5 mg into the skin every Wednesday.   Yes [provider]  acetaminophen (TYLENOL) 650 MG CR tablet Take  1,300 mg by mouth daily as needed for pain.    [provider]  guaiFENesin-dextromethorphan (ROBITUSSIN DM) 100-10 MG/5ML syrup Take 5 mLs by mouth every 4 (four) hours as needed for cough. 09/13/20   Aline August, MD  melatonin 5 MG TABS Take 5 mg by mouth at bedtime as needed (sleep).    [provider]  methocarbamol (ROBAXIN) 500 MG tablet Take 1 tablet (500 mg total) by mouth every 8 (eight) hours as needed for muscle spasms. 09/13/20   Aline August, MD  oxyCODONE-acetaminophen (PERCOCET/ROXICET) 5-325 MG tablet Take 1 tablet by mouth every 6 (six) hours as needed for severe pain. Patient not taking: Reported on 10/25/2020 09/13/20   Aline August, MD    Physical Exam: Vitals:   10/25/20 1700 10/25/20 1730 10/25/20 2025 10/25/20 2135  BP: 114/63 121/62 (!) 117/59 (!) 138/58   Pulse: (!) 101 98 100 (!) 102  Resp: 18 18 16 20   Temp:      SpO2: 99% 99% 100% 98%    Constitutional: NAD, calm, comfortable, obese female sitting upright in bed Vitals:   10/25/20 1700 10/25/20 1730 10/25/20 2025 10/25/20 2135  BP: 114/63 121/62 (!) 117/59 (!) 138/58  Pulse: (!) 101 98 100 (!) 102  Resp: 18 18 16 20   Temp:      SpO2: 99% 99% 100% 98%   Eyes: PERRL, lids and conjunctivae normal ENMT: Mucous membranes are moist. Glossitis  Neck: normal, supple Respiratory: clear to auscultation bilaterally, no wheezing, no crackles. Labored respiration with speech. No accessory muscle use.  Cardiovascular: Regular rate and rhythm, no murmurs / rubs / gallops. Diffuse bilateral lower extremity 3+pitting edema Abdomen: significantly distended abdomen with fluid shift.Difficult to access Bowel sounds due to ascites.  Musculoskeletal: no clubbing / cyanosis. No joint deformity upper and lower extremities. Good ROM, no contractures. Normal muscle tone.  Skin: no rashes, lesions, ulcers. No induration. No jaundice. No telangiectasia. Neurologic: CN 2-12 grossly intact. Sensation intact,  Strength 5/5 in all 4.  Psychiatric: Normal judgment and insight. Alert and oriented x 3. Normal mood.     Labs on Admission: I have personally reviewed following labs and imaging studies  CBC: Recent Labs  Lab 10/25/20 1547  WBC 8.3  HGB 8.1*  HCT 28.7*  MCV 69.0*  PLT 623   Basic Metabolic Panel: Recent Labs  Lab 10/25/20 1547  NA 136  K 4.6  CL 108  CO2 18*  GLUCOSE 183*  BUN 28*  CREATININE 1.23*  CALCIUM 8.7*  MG 2.0   GFR: CrCl cannot be calculated (Unknown ideal weight.). Liver Function Tests: Recent Labs  Lab 10/25/20 1547  AST 32  ALT 24  ALKPHOS 97  BILITOT 1.3*  PROT 6.2*  ALBUMIN 2.9*   Recent Labs  Lab 10/25/20 1547  LIPASE 44   No results for input(s): AMMONIA in the last 168 hours. Coagulation Profile: No results for input(s): INR, PROTIME in the  last 168 hours. Cardiac Enzymes: No results for input(s): CKTOTAL, CKMB, CKMBINDEX, TROPONINI in the last 168 hours. BNP (last 3 results) No results for input(s): PROBNP in the last 8760 hours. HbA1C: No results for input(s): HGBA1C in the last 72 hours. CBG: No results for input(s): GLUCAP in the last 168 hours. Lipid Profile: No results for input(s): CHOL, HDL, LDLCALC, TRIG, CHOLHDL, LDLDIRECT in the last 72 hours. Thyroid Function Tests: Recent Labs    10/25/20 1547  TSH 1.793  FREET4 1.46*   Anemia Panel: No results for input(s):  VITAMINB12, FOLATE, FERRITIN, TIBC, IRON, RETICCTPCT in the last 72 hours. Urine analysis:    Component Value Date/Time   COLORURINE YELLOW (A) 10/25/2020 2026   APPEARANCEUR HAZY (A) 10/25/2020 2026   LABSPEC 1.017 10/25/2020 2026   PHURINE 5.0 10/25/2020 2026   GLUCOSEU NEGATIVE 10/25/2020 2026   HGBUR SMALL (A) 10/25/2020 2026   BILIRUBINUR NEGATIVE 10/25/2020 2026   KETONESUR NEGATIVE 10/25/2020 2026   PROTEINUR NEGATIVE 10/25/2020 2026   NITRITE POSITIVE (A) 10/25/2020 2026   LEUKOCYTESUR SMALL (A) 10/25/2020 2026    Radiological Exams on Admission: CT Angio Chest PE W and/or Wo Contrast  Result Date: 10/25/2020 CLINICAL DATA:  Abdominal and bilateral leg swelling. EXAM: CT ANGIOGRAPHY CHEST WITH CONTRAST TECHNIQUE: Multidetector CT imaging of the chest was performed using the standard protocol during bolus administration of intravenous contrast. Multiplanar CT image reconstructions and MIPs were obtained to evaluate the vascular anatomy. CONTRAST:  40m OMNIPAQUE IOHEXOL 350 MG/ML SOLN COMPARISON:  September 21, 2017 FINDINGS: Cardiovascular: There is moderate severity calcification of the aortic arch without evidence of aneurysmal dilatation or dissection. The subsegmental pulmonary arteries are mildly limited in evaluation due to suboptimal opacification with intravenous contrast. No evidence of pulmonary embolism. Normal heart size. No  pericardial effusion. Mediastinum/Nodes: No enlarged mediastinal, hilar, or axillary lymph nodes. Thyroid gland, trachea, and esophagus demonstrate no significant findings. Lungs/Pleura: Very mild areas of atelectasis are seen within the posterior aspect of the bilateral upper lobes and bilateral lower lobes. A 3 mm calcified lung nodule is seen within the lateral aspect of the right upper lobe. There is no evidence of acute infiltrate, pleural effusion or pneumothorax. Upper Abdomen: The liver is cirrhotic in appearance. A marked amount of ascites is seen within the abdomen Musculoskeletal: A metallic density right shoulder replacement is noted. Chronic anterolateral fourth, fifth and sixth left rib fractures are seen. Degenerative changes seen throughout the thoracic spine. Review of the MIP images confirms the above findings. IMPRESSION: 1. No evidence of pulmonary embolism. 2. Very mild bilateral upper lobe and bilateral lower lobe atelectasis. 3. Cirrhotic liver with a marked amount of ascites. Aortic Atherosclerosis (ICD10-I70.0). Electronically Signed   By: TVirgina NorfolkM.D.   On: 10/25/2020 18:46   CT ABDOMEN PELVIS W CONTRAST  Result Date: 10/25/2020 CLINICAL DATA:  Abdominal and bilateral leg swelling. EXAM: CT ABDOMEN AND PELVIS WITH CONTRAST TECHNIQUE: Multidetector CT imaging of the abdomen and pelvis was performed using the standard protocol following bolus administration of intravenous contrast. CONTRAST:  760mOMNIPAQUE IOHEXOL 350 MG/ML SOLN COMPARISON:  None. FINDINGS: Lower chest: A mild atelectasis is seen within the bilateral lung bases. Hepatobiliary: The liver is cirrhotic in appearance. No focal liver abnormality is seen. Numerous gallstones and gallbladder sludge are seen within the lumen of an otherwise normal-appearing gallbladder. Pancreas: Unremarkable. No pancreatic ductal dilatation or surrounding inflammatory changes. Spleen: There is mild to moderate severity splenomegaly. A  1.8 cm diameter cyst is noted within the posterior aspect of the spleen. Adrenals/Urinary Tract: Adrenal glands are unremarkable. Kidneys are normal, without renal calculi, focal lesion, or hydronephrosis. Bladder is unremarkable. Stomach/Bowel: There is a very small hiatal hernia. Appendix appears normal. No evidence of bowel wall thickening, distention, or inflammatory changes. Vascular/Lymphatic: Aortic atherosclerosis. No enlarged abdominal or pelvic lymph nodes. Reproductive: Status post hysterectomy. No adnexal masses. Other: Moderate severity anasarca is seen involving the walls of the lower abdomen and pelvis. There is a large amount of abdominal and pelvic free fluid. Musculoskeletal: Multilevel degenerative changes seen throughout  the lumbar spine. IMPRESSION: 1. Cirrhosis with a large amount of ascites. 2. Cholelithiasis without evidence of cholecystitis. 3. Moderate severity anasarca. 4. Very small hiatal hernia. 5. Aortic atherosclerosis. Aortic Atherosclerosis (ICD10-I70.0). Electronically Signed   By: Virgina Norfolk M.D.   On: 10/25/2020 18:57      Assessment/Plan  Decompensated liver cirrhosis with ascites/anasarca  No alcohol use. Has hx of hepatitis B in the past  Obtain HCV and HbsAg and HbeAg labs Check PT/INR Obtain Paracentesis with fluid study  Needs GI consult  Daily IV 40 mg Lasix Also will obtain echo but lower suspicion for cardiac cause of edema  AKI on CKD3a Monitor closely on diuretic  Follow with morning lab  Glossitis Could be secondary to cirrhosis or vitamin deficiency Repeat iron panel, check vitamin B6, B12 and folate  Asymptomatic bacteriuria Patient received IV Rocephin in the ED for positive UA but she denies any dysuria or urgency.  She is actually had decreased urine output which I suspect could be due to her third spacing from cirrhosis. Will not continue antibiotics  OSA Continue CPAP  HTN Hold HCTZ due to AKI  Hx of rectal bleed with  anemia No active bleed. Hgb stable around 8 Continue bowel regimen daily Continue iron supplementation  Type 2 DM  Hemoglobin A1c 6.9 on 1/17 Placed on sensitive sliding scale  DVT prophylaxis: SCD Code Status: Full Family Communication: Plan discussed with patient at bedside  disposition Plan: Home with at least 2 midnight stays  Consults called:  Admission status: inpatient    Level of care: Med-Surg  Status is: Inpatient  Remains inpatient appropriate because:Inpatient level of care appropriate due to severity of illness   Dispo: The patient is from: Home              Anticipated d/c is to: Home              Patient currently is not medically stable to d/c.   Difficult to place patient No         Orene Desanctis DO [facility]   If 7PM-7AM, please contact night-coverage [email or web address]   10/25/2020, 10:36 PM

## 2020-10-25 NOTE — ED Notes (Signed)
Admitting at bedside 

## 2020-10-26 ENCOUNTER — Inpatient Hospital Stay
Admit: 2020-10-26 | Discharge: 2020-10-26 | Disposition: A | Discharge status: Home / Self Care | Payer: Managed Care, Other (non HMO) | Attending: Family Medicine | Admitting: Family Medicine

## 2020-10-26 ENCOUNTER — Inpatient Hospital Stay: Admit: 2020-10-26 | Payer: Managed Care, Other (non HMO)

## 2020-10-26 ENCOUNTER — Inpatient Hospital Stay: Payer: Managed Care, Other (non HMO)

## 2020-10-26 ENCOUNTER — Encounter: Payer: Self-pay | Admitting: Family Medicine

## 2020-10-26 LAB — CBC
HCT: 25.4 % — ABNORMAL LOW (ref 36.0–46.0)
Hemoglobin: 7.6 g/dL — ABNORMAL LOW (ref 12.0–15.0)
MCH: 19.7 pg — ABNORMAL LOW (ref 26.0–34.0)
MCHC: 29.9 g/dL — ABNORMAL LOW (ref 30.0–36.0)
MCV: 66 fL — ABNORMAL LOW (ref 80.0–100.0)
Platelets: 238 10*3/uL (ref 150–400)
RBC: 3.85 MIL/uL — ABNORMAL LOW (ref 3.87–5.11)
RDW: 27.9 % — ABNORMAL HIGH (ref 11.5–15.5)
WBC: 6.4 10*3/uL (ref 4.0–10.5)
nRBC: 0 % (ref 0.0–0.2)

## 2020-10-26 LAB — PROTEIN, PLEURAL OR PERITONEAL FLUID: Total protein, fluid: <3 g/dL

## 2020-10-26 LAB — ALBUMIN, PLEURAL OR PERITONEAL FLUID: Albumin, Fluid: <1 g/dL

## 2020-10-26 LAB — COMPREHENSIVE METABOLIC PANEL
ALT: 22 U/L (ref 0–44)
AST: 30 U/L (ref 15–41)
Albumin: 2.7 g/dL — ABNORMAL LOW (ref 3.5–5.0)
Alkaline Phosphatase: 94 U/L (ref 38–126)
Anion gap: 8 (ref 5–15)
BUN: 26 mg/dL — ABNORMAL HIGH (ref 8–23)
CO2: 21 mmol/L — ABNORMAL LOW (ref 22–32)
Calcium: 8.7 mg/dL — ABNORMAL LOW (ref 8.9–10.3)
Chloride: 109 mmol/L (ref 98–111)
Creatinine, Ser: 1.29 mg/dL — ABNORMAL HIGH (ref 0.44–1.00)
GFR, Estimated: 47 mL/min — ABNORMAL LOW (ref >60)
Glucose, Bld: 72 mg/dL (ref 70–99)
Potassium: 3.8 mmol/L (ref 3.5–5.1)
Sodium: 138 mmol/L (ref 135–145)
Total Bilirubin: 1 mg/dL (ref 0.3–1.2)
Total Protein: 5.9 g/dL — ABNORMAL LOW (ref 6.5–8.1)

## 2020-10-26 LAB — IRON AND TIBC
Iron: 15 ug/dL — ABNORMAL LOW (ref 28–170)
Saturation Ratios: 5 % — ABNORMAL LOW (ref 10.4–31.8)
TIBC: 318 ug/dL (ref 250–450)
UIBC: 303 ug/dL

## 2020-10-26 LAB — CBG MONITORING, ED
Glucose-Capillary: 124 mg/dL — ABNORMAL HIGH (ref 70–99)
Glucose-Capillary: 62 mg/dL — ABNORMAL LOW (ref 70–99)
Glucose-Capillary: 86 mg/dL (ref 70–99)
Glucose-Capillary: 78 mg/dL (ref 70–99)

## 2020-10-26 LAB — GLUCOSE, CAPILLARY
Glucose-Capillary: 159 mg/dL — ABNORMAL HIGH (ref 70–99)
Glucose-Capillary: 124 mg/dL — ABNORMAL HIGH (ref 70–99)

## 2020-10-26 LAB — AMYLASE, PLEURAL OR PERITONEAL FLUID: Amylase, Fluid: 8 U/L

## 2020-10-26 LAB — HEPATITIS C ANTIBODY: HCV Ab: NONREACTIVE

## 2020-10-26 LAB — LACTATE DEHYDROGENASE, PLEURAL OR PERITONEAL FLUID: LD, Fluid: 41 U/L — ABNORMAL HIGH (ref 3–23)

## 2020-10-26 LAB — FOLATE: Folate: 15.1 ng/mL (ref >5.9)

## 2020-10-26 LAB — GLUCOSE, PLEURAL OR PERITONEAL FLUID: Glucose, Fluid: 101 mg/dL

## 2020-10-26 LAB — PROTIME-INR
INR: 1.2 (ref 0.8–1.2)
Prothrombin Time: 14.8 seconds (ref 11.4–15.2)

## 2020-10-26 LAB — VITAMIN B12: Vitamin B-12: 339 pg/mL (ref 180–914)

## 2020-10-26 MED ORDER — SPIRONOLACTONE 25 MG PO TABS
25.0000 mg | ORAL_TABLET | Freq: Every day | ORAL | Status: DC
  Administered 2020-10-26 – 2020-10-30 (×5): 25 mg via ORAL
  Filled 2020-10-26 (×5): qty 1

## 2020-10-26 MED ORDER — SODIUM CHLORIDE 0.9 % IV SOLN
INTRAVENOUS | Status: DC | PRN
  Administered 2020-10-26 – 2020-10-29 (×3): 250 mL via INTRAVENOUS

## 2020-10-26 MED ORDER — SODIUM CHLORIDE 0.9 % IV SOLN
2.0000 g | INTRAVENOUS | Status: DC
  Administered 2020-10-26 – 2020-10-27 (×2): 2 g via INTRAVENOUS
  Filled 2020-10-26: qty 2
  Filled 2020-10-26: qty 20
  Filled 2020-10-26: qty 2

## 2020-10-26 MED ORDER — ALBUMIN HUMAN 25 % IV SOLN
12.5000 g | Freq: Once | INTRAVENOUS | Status: AC
  Administered 2020-10-26: 12.5 g via INTRAVENOUS
  Filled 2020-10-26: qty 50

## 2020-10-26 MED ORDER — SODIUM CHLORIDE 0.9 % IV SOLN
300.0000 mg | Freq: Once | INTRAVENOUS | Status: DC
  Filled 2020-10-26: qty 15

## 2020-10-26 MED ORDER — FUROSEMIDE 40 MG PO TABS: 40.0000 mg | ORAL_TABLET | Freq: Every day | ORAL | Status: DC

## 2020-10-26 MED ORDER — DEXTROSE 50 % IV SOLN
12.5000 g | Freq: Once | INTRAVENOUS | Status: AC
  Administered 2020-10-26: 12.5 g via INTRAVENOUS
  Filled 2020-10-26: qty 50

## 2020-10-26 NOTE — ED Notes (Signed)
Patient to US

## 2020-10-26 NOTE — Progress Notes (Signed)
*  PRELIMINARY RESULTS* Echocardiogram 2D Echocardiogram has been performed.  Wallie Char Laportia Carley 10/26/2020, 4:19 PM

## 2020-10-26 NOTE — Progress Notes (Signed)
PROGRESS NOTE    Rachel Huber  KVQ:[id number] DOB: 31-Jan-1956 DOA: 10/25/2020 PCP: [name], MD  Brief Narrative:   65 y.o. female with medical history significant for rectal bleeding presumed to be from a hemorrhoids,hypertension, hyperlipidemia, diabetes mellitus, CKD-3, GERD, depression, anxiety, thalassemia minor, hepatitis B, neuropathy, OSA on CPAP, recent COVID-19 pneumonia, recent nondisplaced right greater trochanteric hip fracture that was managed conservatively in January.  She was recently hospitalized from 2/21-2/24 for severe acute on chronic blood loss anemia requiring up to 4 units of PRBC transfusion.  She had both EGD and colonoscopy with several polyps removed.  EGD concerning for portal hypertension gastropathy. Also noted to have AKI at that time that was stable with IV fluids.  For the past 2 weeks since her last discharge she has noted gradual increase abdominal distention and tightening of the skin on her abdomen. Has early satiety and unable to eat much. For the past two days she has also noted edema of both legs up to her thigh  Imaging significant for large volume ascites.  Patient status post ultrasound-guided paracentesis, 6 L fluid removed.   Assessment & Plan:   Principal Problem:   Decompensated hepatic cirrhosis (HCC) Active Problems:   Diabetes mellitus with hyperglycemia (HCC)   Hypertension   Acute renal failure superimposed on stage 3a chronic kidney disease (HCC)   Rectal bleeding   OSA (obstructive sleep apnea)   Glossitis  Decompensated liver cirrhosis with ascites/anasarca  No alcohol use. Has hx of hepatitis B in the past  Unclear etiology of cirrhosis, possibly related to prior history of hepatitis B infection -Also on differential as well cryptogenic cirrhosis Hepatitis panel pending Status post ultrasound-guided paracentesis, 6 L removed Plan: Albumin 12.5 g of 25% x 1 Lasix 40 mg IV daily Aldactone 25 mg daily Daily  INR Echocardiogram Follow-up hepatitis panel  AKI on CKD3a Monitor closely on diuretic    Glossitis Could be secondary to cirrhosis or vitamin deficiency Folate normal B12 normal Iron stores low  Iron deficiency anemia Venofer x1 Daily iron supplementation   Asymptomatic bacteriuria Patient received IV Rocephin in the ED for positive UA but she denies any dysuria or urgency.  She is actually had decreased urine output which I suspect could be due to her third spacing from cirrhosis. We will continue Rocephin for now pending results from paracentesis.  If no signs of SBP will discontinue  OSA Continue CPAP  HTN Hold HCTZ due to AKI  Hx of rectal bleed with anemia No active bleed. Hgb stable around 8 Continue bowel regimen daily Continue iron supplementation  Type 2 DM  Hemoglobin A1c 6.9 on 1/17 Placed on sensitive sliding scale   DVT prophylaxis: SCD Code Status: Full Family Communication: None today.  Offered to call but patient declined.  States that she will speak with husband Disposition Plan: Status is: Inpatient  Remains inpatient appropriate because:Inpatient level of care appropriate due to severity of illness   Dispo: The patient is from: Home              Anticipated d/c is to: Home              Patient currently is not medically stable to d/c.   Difficult to place patient No  Decompensated cirrhosis complicated by ascites.  Disposition plan pending.  Possibly within 24-48 hours.     Level of care: Med-Surg  Consultants:   None  Procedures:   Paracentesis, 3/11  Antimicrobials:  Ceftriaxone  Subjective: Patient seen and examined.  Continues to endorse abdominal distention but no pain complaints.  Objective: Vitals:   10/26/20 1100 10/26/20 1156 10/26/20 1251 10/26/20 1421  BP: (!) 125/59 (!) 153/65 (!) 155/61 (!) 141/66  Pulse: 93 98 98 (!) 106  Resp: 18   20  Temp:    98.5 F (36.9 C)  TempSrc:    Oral  SpO2: 97%  98% 100% 100%   No intake or output data in the 24 hours ending 10/26/20 1446 There were no vitals filed for this visit.  Examination:  General exam: Appears calm and comfortable  Respiratory system: Clear to auscultation. Respiratory effort normal. Cardiovascular system: S1 & S2 heard, RRR. No JVD, murmurs, rubs, gallops or clicks. No pedal edema. Gastrointestinal system: Diffusely distended, nontender to palpation, positive fluid wave Central nervous system: Alert and oriented. No focal neurological deficits. Extremities: Symmetric 5 x 5 power. Skin: No rashes, lesions or ulcers Psychiatry: Judgement and insight appear normal. Mood & affect appropriate.     Data Reviewed: I have personally reviewed following labs and imaging studies  CBC: Recent Labs  Lab 10/25/20 1547 10/26/20 0545  WBC 8.3 6.4  HGB 8.1* 7.6*  HCT 28.7* 25.4*  MCV 69.0* 66.0*  PLT 294 076   Basic Metabolic Panel: Recent Labs  Lab 10/25/20 1547 10/26/20 0545  NA 136 138  K 4.6 3.8  CL 108 109  CO2 18* 21*  GLUCOSE 183* 72  BUN 28* 26*  CREATININE 1.23* 1.29*  CALCIUM 8.7* 8.7*  MG 2.0  --    GFR: CrCl cannot be calculated (Unknown ideal weight.). Liver Function Tests: Recent Labs  Lab 10/25/20 1547 10/26/20 0545  AST 32 30  ALT 24 22  ALKPHOS 97 94  BILITOT 1.3* 1.0  PROT 6.2* 5.9*  ALBUMIN 2.9* 2.7*   Recent Labs  Lab 10/25/20 1547  LIPASE 44   No results for input(s): AMMONIA in the last 168 hours. Coagulation Profile: Recent Labs  Lab 10/26/20 0545  INR 1.2   Cardiac Enzymes: No results for input(s): CKTOTAL, CKMB, CKMBINDEX, TROPONINI in the last 168 hours. BNP (last 3 results) No results for input(s): PROBNP in the last 8760 hours. HbA1C: No results for input(s): HGBA1C in the last 72 hours. CBG: Recent Labs  Lab 10/26/20 0121 10/26/20 0715 10/26/20 0738 10/26/20 1122  GLUCAP 124* 62* 86 78   Lipid Profile: No results for input(s): CHOL, HDL, LDLCALC,  TRIG, CHOLHDL, LDLDIRECT in the last 72 hours. Thyroid Function Tests: Recent Labs    10/25/20 1547  TSH 1.793  FREET4 1.46*   Anemia Panel: Recent Labs    10/26/20 0545  VITAMINB12 339  FOLATE 15.1  TIBC 318  IRON 15*   Sepsis Labs: No results for input(s): PROCALCITON, LATICACIDVEN in the last 168 hours.  No results found for this or any previous visit (from the past 240 hour(s)).       Radiology Studies: CT Angio Chest PE W and/or Wo Contrast  Result Date: 10/25/2020 CLINICAL DATA:  Abdominal and bilateral leg swelling. EXAM: CT ANGIOGRAPHY CHEST WITH CONTRAST TECHNIQUE: Multidetector CT imaging of the chest was performed using the standard protocol during bolus administration of intravenous contrast. Multiplanar CT image reconstructions and MIPs were obtained to evaluate the vascular anatomy. CONTRAST:  58m OMNIPAQUE IOHEXOL 350 MG/ML SOLN COMPARISON:  September 21, 2017 FINDINGS: Cardiovascular: There is moderate severity calcification of the aortic arch without evidence of aneurysmal dilatation or dissection. The subsegmental pulmonary arteries are mildly  limited in evaluation due to suboptimal opacification with intravenous contrast. No evidence of pulmonary embolism. Normal heart size. No pericardial effusion. Mediastinum/Nodes: No enlarged mediastinal, hilar, or axillary lymph nodes. Thyroid gland, trachea, and esophagus demonstrate no significant findings. Lungs/Pleura: Very mild areas of atelectasis are seen within the posterior aspect of the bilateral upper lobes and bilateral lower lobes. A 3 mm calcified lung nodule is seen within the lateral aspect of the right upper lobe. There is no evidence of acute infiltrate, pleural effusion or pneumothorax. Upper Abdomen: The liver is cirrhotic in appearance. A marked amount of ascites is seen within the abdomen Musculoskeletal: A metallic density right shoulder replacement is noted. Chronic anterolateral fourth, fifth and sixth left  rib fractures are seen. Degenerative changes seen throughout the thoracic spine. Review of the MIP images confirms the above findings. IMPRESSION: 1. No evidence of pulmonary embolism. 2. Very mild bilateral upper lobe and bilateral lower lobe atelectasis. 3. Cirrhotic liver with a marked amount of ascites. Aortic Atherosclerosis (ICD10-I70.0). Electronically Signed   By: Virgina Norfolk M.D.   On: 10/25/2020 18:46   CT ABDOMEN PELVIS W CONTRAST  Result Date: 10/25/2020 CLINICAL DATA:  Abdominal and bilateral leg swelling. EXAM: CT ABDOMEN AND PELVIS WITH CONTRAST TECHNIQUE: Multidetector CT imaging of the abdomen and pelvis was performed using the standard protocol following bolus administration of intravenous contrast. CONTRAST:  75m OMNIPAQUE IOHEXOL 350 MG/ML SOLN COMPARISON:  None. FINDINGS: Lower chest: A mild atelectasis is seen within the bilateral lung bases. Hepatobiliary: The liver is cirrhotic in appearance. No focal liver abnormality is seen. Numerous gallstones and gallbladder sludge are seen within the lumen of an otherwise normal-appearing gallbladder. Pancreas: Unremarkable. No pancreatic ductal dilatation or surrounding inflammatory changes. Spleen: There is mild to moderate severity splenomegaly. A 1.8 cm diameter cyst is noted within the posterior aspect of the spleen. Adrenals/Urinary Tract: Adrenal glands are unremarkable. Kidneys are normal, without renal calculi, focal lesion, or hydronephrosis. Bladder is unremarkable. Stomach/Bowel: There is a very small hiatal hernia. Appendix appears normal. No evidence of bowel wall thickening, distention, or inflammatory changes. Vascular/Lymphatic: Aortic atherosclerosis. No enlarged abdominal or pelvic lymph nodes. Reproductive: Status post hysterectomy. No adnexal masses. Other: Moderate severity anasarca is seen involving the walls of the lower abdomen and pelvis. There is a large amount of abdominal and pelvic free fluid. Musculoskeletal:  Multilevel degenerative changes seen throughout the lumbar spine. IMPRESSION: 1. Cirrhosis with a large amount of ascites. 2. Cholelithiasis without evidence of cholecystitis. 3. Moderate severity anasarca. 4. Very small hiatal hernia. 5. Aortic atherosclerosis. Aortic Atherosclerosis (ICD10-I70.0). Electronically Signed   By: TVirgina NorfolkM.D.   On: 10/25/2020 18:57        Scheduled Meds: . ferrous sulfate  325 mg Oral Daily  . furosemide  40 mg Intravenous Daily  . gabapentin  800 mg Oral BID  . hydrocortisone  25 mg Rectal BID  . insulin aspart  0-5 Units Subcutaneous QHS  . insulin aspart  0-9 Units Subcutaneous TID WC  . pantoprazole  40 mg Oral Daily  . polyethylene glycol  17 g Oral Daily  . sertraline  50 mg Oral Daily  . spironolactone  25 mg Oral Daily   Continuous Infusions: . albumin human    . cefTRIAXone (ROCEPHIN)  IV       LOS: 1 day    Time spent: 25 minutes    SSidney Ace MD [facility] Pager [phone]  If 7PM-7AM, please contact night-coverage 10/26/2020, 2:46 PM

## 2020-10-26 NOTE — ED Notes (Signed)
Report to Shirlean Mylar, RN

## 2020-10-26 NOTE — ED Notes (Signed)
Noted hypoglycemia on CBG check. Hypoglycemia algorithm followed. Pt NPO.

## 2020-10-26 NOTE — ED Notes (Signed)
CBG rechecked. 42. Dr. Priscella Mann at bedside and aware. No further orders at this time.

## 2020-10-26 NOTE — ED Notes (Signed)
Patient returned from [place], patient ambulated to and from restroom, patient assisted back in bed and provided with lunch, patient reports she doesn't want anything that was provided, patient given sprite.

## 2020-10-26 NOTE — ED Notes (Signed)
Up to bathroom with walker, back in bed at this time.

## 2020-10-26 NOTE — ED Notes (Signed)
Patient ambulated to and from restroom with walker, steady gait noted.

## 2020-10-26 NOTE — Progress Notes (Signed)
This 65 yo white, female pt was admitted to 224 from the ED.  PMHx: DM, CKD, GERD, depression/anxiety, Hep B, OSA with CPAP, recent hip fracture, recent COVID.  Pt was admitted to the ED from her gyn office with abdominal swelling.  Paracentesis done today.  6L fluid removed.  VSS. Pt without any complaints at this time.  Ambulates with stand by assist using a front wheeled walker.

## 2020-10-26 NOTE — Procedures (Signed)
PROCEDURE SUMMARY:  Successful US guided paracentesis from right abdomen.  Yielded 6 L of clear yellow fluid.  No immediate complications.  Pt tolerated well.   Specimen sent for labs.  EBL < 1 mL  Theresa Duty, NP 10/26/2020 1:10 PM

## 2020-10-26 NOTE — ED Notes (Signed)
Pt ambulated to bathroom utilizing walker.

## 2020-10-26 NOTE — ED Notes (Signed)
Pt eating breakfast at this time. No further needs expressed. Will continue to monitor patient.

## 2020-10-27 ENCOUNTER — Encounter: Payer: Self-pay | Admitting: Family Medicine

## 2020-10-27 LAB — ECHOCARDIOGRAM COMPLETE
AR max vel: 3.5 cm2
AV Area VTI: 3.92 cm2
AV Area mean vel: 3.39 cm2
AV Mean grad: 5 mmHg
AV Peak grad: 10.1 mmHg
Ao pk vel: 1.59 m/s
Area-P 1/2: 4.46 cm2
Height: 70.984 in
MV VTI: 5.18 cm2
S' Lateral: 3.3 cm

## 2020-10-27 LAB — COMPREHENSIVE METABOLIC PANEL
ALT: 20 U/L (ref 0–44)
AST: 25 U/L (ref 15–41)
Albumin: 2.6 g/dL — ABNORMAL LOW (ref 3.5–5.0)
Alkaline Phosphatase: 84 U/L (ref 38–126)
Anion gap: 8 (ref 5–15)
BUN: 21 mg/dL (ref 8–23)
CO2: 22 mmol/L (ref 22–32)
Calcium: 8.5 mg/dL — ABNORMAL LOW (ref 8.9–10.3)
Chloride: 107 mmol/L (ref 98–111)
Creatinine, Ser: 0.92 mg/dL (ref 0.44–1.00)
GFR, Estimated: >60 mL/min (ref >60)
Glucose, Bld: 78 mg/dL (ref 70–99)
Potassium: 3.4 mmol/L — ABNORMAL LOW (ref 3.5–5.1)
Sodium: 137 mmol/L (ref 135–145)
Total Bilirubin: 0.9 mg/dL (ref 0.3–1.2)
Total Protein: 5.5 g/dL — ABNORMAL LOW (ref 6.5–8.1)

## 2020-10-27 LAB — CBC WITH DIFFERENTIAL/PLATELET
Abs Immature Granulocytes: 0.01 10*3/uL (ref 0.00–0.07)
Basophils Absolute: 0 10*3/uL (ref 0.0–0.1)
Basophils Relative: 0 %
Eosinophils Absolute: 0.2 10*3/uL (ref 0.0–0.5)
Eosinophils Relative: 5 %
HCT: 26.1 % — ABNORMAL LOW (ref 36.0–46.0)
Hemoglobin: 7.8 g/dL — ABNORMAL LOW (ref 12.0–15.0)
Immature Granulocytes: 0 %
Lymphocytes Relative: 34 %
Lymphs Abs: 1.6 10*3/uL (ref 0.7–4.0)
MCH: 19.7 pg — ABNORMAL LOW (ref 26.0–34.0)
MCHC: 29.9 g/dL — ABNORMAL LOW (ref 30.0–36.0)
MCV: 66.1 fL — ABNORMAL LOW (ref 80.0–100.0)
Monocytes Absolute: 0.4 10*3/uL (ref 0.1–1.0)
Monocytes Relative: 9 %
Neutro Abs: 2.4 10*3/uL (ref 1.7–7.7)
Neutrophils Relative %: 52 %
Platelets: 219 10*3/uL (ref 150–400)
RBC: 3.95 MIL/uL (ref 3.87–5.11)
RDW: 28 % — ABNORMAL HIGH (ref 11.5–15.5)
WBC: 4.6 10*3/uL (ref 4.0–10.5)
nRBC: 0 % (ref 0.0–0.2)

## 2020-10-27 LAB — HEPATITIS B E ANTIGEN: Hep B E Ag: NEGATIVE

## 2020-10-27 LAB — GLUCOSE, CAPILLARY
Glucose-Capillary: 74 mg/dL (ref 70–99)
Glucose-Capillary: 147 mg/dL — ABNORMAL HIGH (ref 70–99)
Glucose-Capillary: 199 mg/dL — ABNORMAL HIGH (ref 70–99)
Glucose-Capillary: 221 mg/dL — ABNORMAL HIGH (ref 70–99)

## 2020-10-27 LAB — MAGNESIUM: Magnesium: 1.8 mg/dL (ref 1.7–2.4)

## 2020-10-27 LAB — HEPATITIS B SURFACE ANTIBODY, QUANTITATIVE: Hep B S AB Quant (Post): <3.1 m[IU]/mL — ABNORMAL LOW (ref >9.9)

## 2020-10-27 MED ORDER — FUROSEMIDE 40 MG PO TABS: 40.0000 mg | ORAL_TABLET | Freq: Every day | ORAL | Status: DC

## 2020-10-27 MED ORDER — FUROSEMIDE 10 MG/ML IJ SOLN
40.0000 mg | Freq: Every day | INTRAMUSCULAR | Status: DC
  Administered 2020-10-27: 40 mg via INTRAVENOUS
  Filled 2020-10-27: qty 4

## 2020-10-27 MED ORDER — METHOCARBAMOL 500 MG PO TABS
500.0000 mg | ORAL_TABLET | Freq: Once | ORAL | Status: AC
  Administered 2020-10-27: 500 mg via ORAL
  Filled 2020-10-27: qty 1

## 2020-10-27 NOTE — Progress Notes (Signed)
PROGRESS NOTE    Rachel Huber  VHQ:[id number] DOB: 1956/02/23 DOA: 10/25/2020 PCP: [name], MD  Brief Narrative:   65 y.o. female with medical history significant for rectal bleeding presumed to be from a hemorrhoids,hypertension, hyperlipidemia, diabetes mellitus, CKD-3, GERD, depression, anxiety, thalassemia minor, hepatitis B, neuropathy, OSA on CPAP, recent COVID-19 pneumonia, recent nondisplaced right greater trochanteric hip fracture that was managed conservatively in January.  She was recently hospitalized from 2/21-2/24 for severe acute on chronic blood loss anemia requiring up to 4 units of PRBC transfusion.  She had both EGD and colonoscopy with several polyps removed.  EGD concerning for portal hypertension gastropathy. Also noted to have AKI at that time that was stable with IV fluids.  For the past 2 weeks since her last discharge she has noted gradual increase abdominal distention and tightening of the skin on her abdomen. Has early satiety and unable to eat much. For the past two days she has also noted edema of both legs up to her thigh  Imaging significant for large volume ascites.  Patient status post ultrasound-guided paracentesis, 6 L fluid removed.  Abdominal distention improved however not resolved   Assessment & Plan:   Principal Problem:   Decompensated hepatic cirrhosis (HCC) Active Problems:   Diabetes mellitus with hyperglycemia (HCC)   Hypertension   Acute renal failure superimposed on stage 3a chronic kidney disease (HCC)   Rectal bleeding   OSA (obstructive sleep apnea)   Glossitis  Decompensated liver cirrhosis with ascites/anasarca  No alcohol use. Has hx of hepatitis B in the past  Unclear etiology of cirrhosis, possibly related to prior history of hepatitis B infection [name] cirrhosis also on differential as well cryptogenic cirrhosis Status post ultrasound-guided paracentesis, 6 L removed Received albumin bolus post  paracentesis Acute hepatitis panel negative Plan: 40 IV Lasix today, switch to 40 p.o. Lasix daily tomorrow Continue Aldactone 25 mg daily Daily INR Echocardiogram Follow-up hepatitis panel  AKI on CKD3a Monitor closely on diuretic    Glossitis Could be secondary to cirrhosis or vitamin deficiency Folate normal B12 normal Iron stores low  Iron deficiency anemia Venofer x1 Daily iron supplementation   Asymptomatic bacteriuria Patient received IV Rocephin in the ED for positive UA but she denies any dysuria or urgency.  She is actually had decreased urine output which I suspect could be due to her third spacing from cirrhosis. We will continue Rocephin for now pending results from paracentesis.  If no signs of SBP will discontinue  OSA Continue CPAP  HTN Hold HCTZ due to AKI  Hx of rectal bleed with anemia No active bleed. Hgb stable around 8 Continue bowel regimen daily Continue iron supplementation  Type 2 DM  Hemoglobin A1c 6.9 on 1/17 Placed on sensitive sliding scale   DVT prophylaxis: SCD Code Status: Full Family Communication: None today.  Offered to call but patient declined.  States that she will speak with husband Disposition Plan: Status is: Inpatient  Remains inpatient appropriate because:Inpatient level of care appropriate due to severity of illness   Dispo: The patient is from: Home              Anticipated d/c is to: Home              Patient currently is not medically stable to d/c.   Difficult to place patient No  Decompensated cirrhosis.  Status post paracentesis for ascites however still with some abdominal distention.  Possibly requires another paracentesis prior to discharge.  Level of care: Med-Surg  Consultants:   None  Procedures:   Paracentesis, 3/11  Antimicrobials:  Ceftriaxone   Subjective: Patient seen and examined.  Abdominal station improved but not resolved.  No other pain  complaints.  Objective: Vitals:   10/26/20 1939 10/26/20 2307 10/27/20 0447 10/27/20 1035  BP: (!) 135/52 (!) 118/46 (!) 113/53 127/61  Pulse: 93 88 93 98  Resp: 20 18 20 18   Temp: 98.6 F (37 C) 98.5 F (36.9 C) 98.1 F (36.7 C) 98.5 F (36.9 C)  TempSrc: Oral Oral Oral   SpO2: 100% 100% 98% 97%  Weight:   124.5 kg   Height:        Intake/Output Summary (Last 24 hours) at 10/27/2020 1400 Last data filed at 10/27/2020 1039 Gross per 24 hour  Intake 560 ml  Output 400 ml  Net 160 ml   Filed Weights   10/26/20 1840 10/27/20 0447  Weight: 122.4 kg 124.5 kg    Examination:  General exam: Appears calm and comfortable  Respiratory system: Clear to auscultation. Respiratory effort normal. Cardiovascular system: S1 & S2 heard, RRR. No JVD, murmurs, rubs, gallops or clicks. No pedal edema. Gastrointestinal system: Mildly distended.  Nontender to palpation.   Central nervous system: Alert and oriented. No focal neurological deficits. Extremities: Symmetric 5 x 5 power. Skin: No rashes, lesions or ulcers Psychiatry: Judgement and insight appear normal. Mood & affect appropriate.     Data Reviewed: I have personally reviewed following labs and imaging studies  CBC: Recent Labs  Lab 10/25/20 1547 10/26/20 0545 10/27/20 0738  WBC 8.3 6.4 4.6  NEUTROABS  --   --  2.4  HGB 8.1* 7.6* 7.8*  HCT 28.7* 25.4* 26.1*  MCV 69.0* 66.0* 66.1*  PLT 294 238 825   Basic Metabolic Panel: Recent Labs  Lab 10/25/20 1547 10/26/20 0545 10/27/20 0738  NA 136 138 137  K 4.6 3.8 3.4*  CL 108 109 107  CO2 18* 21* 22  GLUCOSE 183* 72 78  BUN 28* 26* 21  CREATININE 1.23* 1.29* 0.92  CALCIUM 8.7* 8.7* 8.5*  MG 2.0  --  1.8   GFR: Estimated Creatinine Clearance: 92.4 mL/min (by C-G formula based on SCr of 0.92 mg/dL). Liver Function Tests: Recent Labs  Lab 10/25/20 1547 10/26/20 0545 10/27/20 0738  AST 32 30 25  ALT 24 22 20   ALKPHOS 97 94 84  BILITOT 1.3* 1.0 0.9  PROT  6.2* 5.9* 5.5*  ALBUMIN 2.9* 2.7* 2.6*   Recent Labs  Lab 10/25/20 1547  LIPASE 44   No results for input(s): AMMONIA in the last 168 hours. Coagulation Profile: Recent Labs  Lab 10/26/20 0545  INR 1.2   Cardiac Enzymes: No results for input(s): CKTOTAL, CKMB, CKMBINDEX, TROPONINI in the last 168 hours. BNP (last 3 results) No results for input(s): PROBNP in the last 8760 hours. HbA1C: No results for input(s): HGBA1C in the last 72 hours. CBG: Recent Labs  Lab 10/26/20 1122 10/26/20 1636 10/26/20 2101 10/27/20 0758 10/27/20 1134  GLUCAP 78 159* 124* 74 147*   Lipid Profile: No results for input(s): CHOL, HDL, LDLCALC, TRIG, CHOLHDL, LDLDIRECT in the last 72 hours. Thyroid Function Tests: Recent Labs    10/25/20 1547  TSH 1.793  FREET4 1.46*   Anemia Panel: Recent Labs    10/26/20 0545  VITAMINB12 339  FOLATE 15.1  TIBC 318  IRON 15*   Sepsis Labs: No results for input(s): PROCALCITON, LATICACIDVEN in the last 168 hours.  Recent Results (from the past 240 hour(s))  Urine Culture     Status: Abnormal (Preliminary result)   Collection Time: 10/25/20  8:26 PM   Specimen: Urine, Random  Result Value Ref Range Status   Specimen Description   Final    URINE, RANDOM Performed at [facility], [address], [place], [place] [address]    Special Requests   Final    NONE Performed at [facility], [address] [place], [place] [address]    Culture (A)  Final    >=100,000 COLONIES/mL Lonell Grandchild NEGATIVE RODS SUSCEPTIBILITIES TO FOLLOW Performed at [facility], [place] [address], [place], [place] [address]    Report Status PENDING  Incomplete  Aerobic/Anaerobic Culture w Gram Stain (surgical/deep wound)     Status: None (Preliminary result)   Collection Time: 10/26/20 12:20 PM   Specimen: PATH Cytology Peritoneal fluid  Result Value Ref Range Status   Specimen Description   Final    PERITONEAL Performed at [facility],  [address], [place], [place] [address]    Special Requests   Final    PERITONEAL Performed at [facility], [address] [place], [place] [address]    Gram Stain PENDING  Incomplete   Culture   Final    NO GROWTH < 24 HOURS Performed at [facility], [place] [place], [place] [address]    Report Status PENDING  Incomplete         Radiology Studies: CT Angio Chest PE W and/or Wo Contrast  Result Date: 10/25/2020 CLINICAL DATA:  Abdominal and bilateral leg swelling. EXAM: CT ANGIOGRAPHY CHEST WITH CONTRAST TECHNIQUE: Multidetector CT imaging of the chest was performed using the standard protocol during bolus administration of intravenous contrast. Multiplanar CT image reconstructions and MIPs were obtained to evaluate the vascular anatomy. CONTRAST:  13m OMNIPAQUE IOHEXOL 350 MG/ML SOLN COMPARISON:  September 21, 2017 FINDINGS: Cardiovascular: There is moderate severity calcification of the aortic arch without evidence of aneurysmal dilatation or dissection. The subsegmental pulmonary arteries are mildly limited in evaluation due to suboptimal opacification with intravenous contrast. No evidence of pulmonary embolism. Normal heart size. No pericardial effusion. Mediastinum/Nodes: No enlarged mediastinal, hilar, or axillary lymph nodes. Thyroid gland, trachea, and esophagus demonstrate no significant findings. Lungs/Pleura: Very mild areas of atelectasis are seen within the posterior aspect of the bilateral upper lobes and bilateral lower lobes. A 3 mm calcified lung nodule is seen within the lateral aspect of the right upper lobe. There is no evidence of acute infiltrate, pleural effusion or pneumothorax. Upper Abdomen: The liver is cirrhotic in appearance. A marked amount of ascites is seen within the abdomen Musculoskeletal: A metallic density right shoulder replacement is noted. Chronic anterolateral fourth, fifth and sixth left rib fractures are seen. Degenerative  changes seen throughout the thoracic spine. Review of the MIP images confirms the above findings. IMPRESSION: 1. No evidence of pulmonary embolism. 2. Very mild bilateral upper lobe and bilateral lower lobe atelectasis. 3. Cirrhotic liver with a marked amount of ascites. Aortic Atherosclerosis (ICD10-I70.0). Electronically Signed   By: TVirgina NorfolkM.D.   On: 10/25/2020 18:46   CT ABDOMEN PELVIS W CONTRAST  Result Date: 10/25/2020 CLINICAL DATA:  Abdominal and bilateral leg swelling. EXAM: CT ABDOMEN AND PELVIS WITH CONTRAST TECHNIQUE: Multidetector CT imaging of the abdomen and pelvis was performed using the standard protocol following bolus administration of intravenous contrast. CONTRAST:  761mOMNIPAQUE IOHEXOL 350 MG/ML SOLN COMPARISON:  None. FINDINGS: Lower chest: A mild atelectasis is seen within the bilateral lung bases. Hepatobiliary: The liver is cirrhotic in appearance. No focal liver abnormality is seen. Numerous gallstones and gallbladder sludge are seen within the lumen of an otherwise normal-appearing gallbladder. Pancreas: Unremarkable. No pancreatic ductal dilatation or surrounding inflammatory changes. Spleen: There is mild to moderate severity splenomegaly. A 1.8 cm diameter cyst is noted within the posterior aspect of the spleen. Adrenals/Urinary Tract: Adrenal glands are unremarkable. Kidneys are normal, without renal calculi, focal lesion, or hydronephrosis. Bladder is unremarkable. Stomach/Bowel: There is a very small hiatal hernia. Appendix appears normal. No evidence of bowel wall thickening, distention, or inflammatory changes. Vascular/Lymphatic: Aortic atherosclerosis. No enlarged abdominal or pelvic lymph nodes. Reproductive: Status post hysterectomy. No adnexal masses. Other: Moderate severity anasarca is seen involving the walls of the lower abdomen and pelvis. There is a large amount of abdominal and pelvic free fluid. Musculoskeletal: Multilevel degenerative changes seen  throughout the lumbar spine. IMPRESSION: 1. Cirrhosis with a large amount of ascites. 2. Cholelithiasis without evidence of cholecystitis. 3. Moderate severity anasarca. 4. Very small hiatal hernia. 5. Aortic atherosclerosis. Aortic Atherosclerosis (ICD10-I70.0). Electronically Signed   By: Virgina Norfolk M.D.   On: 10/25/2020 18:57   US Paracentesis  Result Date: 10/26/2020 INDICATION: Patient with a new diagnosis of cirrhosis presents today to IR for a therapeutic and diagnostic paracentesis. EXAM: ULTRASOUND GUIDED PARACENTESIS MEDICATIONS: 1% lidocaine 10 mL COMPLICATIONS: None immediate. PROCEDURE: Informed written consent was obtained from the patient after a discussion of the risks, benefits and alternatives to treatment. A timeout was performed prior to the initiation of the procedure. Initial ultrasound scanning demonstrates a large amount of ascites within the right lower abdominal quadrant. The right lower abdomen was prepped and draped in the usual sterile fashion. 1% lidocaine was used for local anesthesia. Following this, a 19 gauge, 7-cm, Yueh catheter was introduced. An ultrasound image was saved for documentation purposes. The paracentesis was performed. The catheter was removed and a dressing was applied. The patient tolerated the procedure well without immediate post procedural complication. FINDINGS: A total of approximately 6 L of clear yellow fluid was removed. Samples were sent to the laboratory as requested by the clinical team. IMPRESSION: Successful ultrasound-guided paracentesis yielding 6 liters of peritoneal fluid. Read by: Soyla Dryer, NP Electronically Signed   By: Miachel Roux M.D.   On: 10/26/2020 13:17   ECHOCARDIOGRAM COMPLETE  Result Date: 10/27/2020    ECHOCARDIOGRAM REPORT   Patient Name:   Rachel Huber Date of Exam: 10/26/2020 Medical Rec #:  [id number]      Height:       71.0 in Accession #:    [id number]     Weight:       259.0 lb Date of Birth:  1956/05/09        BSA:          2.353 m Patient Age:    74 years       BP:           140/60 mmHg Patient Gender: F              HR:           99 bpm. Exam Location:  [facility] Procedure: 2D Echo, Color Doppler and Cardiac Doppler Indications:     R06.00 Dyspnea  History:         Patient has no prior history of Echocardiogram examinations.  Risk Factors:Sleep Apnea, Diabetes and Hypertension.  Sonographer:     Charmayne Sheer RDCS (AE) Referring Phys:  [id number] Angela Burke TU Diagnosing Phys: Bartholome Bill MD  Sonographer Comments: No subcostal window. Image acquisition challenging due to respiratory motion and Image acquisition challenging due to patient body habitus. Global longitudinal strain was attempted. IMPRESSIONS  1. Left ventricular ejection fraction, by estimation, is 55 to 60%. The left ventricle has normal function. The left ventricle has no regional wall motion abnormalities. There is mild left ventricular hypertrophy. Left ventricular diastolic parameters are consistent with Grade I diastolic dysfunction (impaired relaxation).  2. Right ventricular systolic function is normal. The right ventricular size is normal.  3. The mitral valve was not well visualized. Trivial mitral valve regurgitation.  4. The aortic valve was not well visualized. Aortic valve regurgitation is trivial. FINDINGS  Left Ventricle: Left ventricular ejection fraction, by estimation, is 55 to 60%. The left ventricle has normal function. The left ventricle has no regional wall motion abnormalities. The left ventricular internal cavity size was normal in size. There is  mild left ventricular hypertrophy. Left ventricular diastolic parameters are consistent with Grade I diastolic dysfunction (impaired relaxation). Right Ventricle: The right ventricular size is normal. No increase in right ventricular wall thickness. Right ventricular systolic function is normal. Left Atrium: Left atrial size was normal in size. Right Atrium: Right atrial size was  normal in size. Pericardium: There is no evidence of pericardial effusion. Mitral Valve: The mitral valve was not well visualized. Trivial mitral valve regurgitation. MV peak gradient, 4.0 mmHg. The mean mitral valve gradient is 2.0 mmHg. Tricuspid Valve: The tricuspid valve is not well visualized. Tricuspid valve regurgitation is trivial. Aortic Valve: The aortic valve was not well visualized. Aortic valve regurgitation is trivial. Aortic valve mean gradient measures 5.0 mmHg. Aortic valve peak gradient measures 10.1 mmHg. Aortic valve area, by VTI measures 3.92 cm. Pulmonic Valve: The pulmonic valve was not well visualized. Pulmonic valve regurgitation is trivial. Aorta: The aortic root is normal in size and structure. IAS/Shunts: The interatrial septum was not well visualized.  LEFT VENTRICLE PLAX 2D LVIDd:         4.50 cm  Diastology LVIDs:         3.30 cm  LV e' medial:    9.25 cm/s LV PW:         1.10 cm  LV E/e' medial:  7.1 LV IVS:        1.00 cm  LV e' lateral:   9.68 cm/s LVOT diam:     2.30 cm  LV E/e' lateral: 6.8 LV SV:         103 LV SV Index:   44 LVOT Area:     4.15 cm  RIGHT VENTRICLE RV Basal diam:  2.90 cm LEFT ATRIUM             Index LA diam:        4.00 cm 1.70 cm/m LA Vol (A2C):   39.1 ml 16.61 ml/m LA Vol (A4C):   55.4 ml 23.54 ml/m LA Biplane Vol: 50.5 ml 21.46 ml/m  AORTIC VALVE                    PULMONIC VALVE AV Area (Vmax):    3.50 cm     PV Vmax:       1.23 m/s AV Area (Vmean):   3.39 cm     PV Vmean:      90.500 cm/s AV  Area (VTI):     3.92 cm     PV VTI:        0.215 m AV Vmax:           159.00 cm/s  PV Peak grad:  6.1 mmHg AV Vmean:          105.000 cm/s PV Mean grad:  4.0 mmHg AV VTI:            0.262 m AV Peak Grad:      10.1 mmHg AV Mean Grad:      5.0 mmHg LVOT Vmax:         134.00 cm/s LVOT Vmean:        85.700 cm/s LVOT VTI:          0.247 m LVOT/AV VTI ratio: 0.94  AORTA Ao Root diam: 2.90 cm MITRAL VALVE MV Area (PHT): 4.46 cm    SHUNTS MV Area VTI:   5.18 cm     Systemic VTI:  0.25 m MV Peak grad:  4.0 mmHg    Systemic Diam: 2.30 cm MV Mean grad:  2.0 mmHg MV Vmax:       1.00 m/s MV Vmean:      68.9 cm/s MV Decel Time: 170 msec MV E velocity: 65.60 cm/s MV A velocity: 93.00 cm/s MV E/A ratio:  0.71 Bartholome Bill MD Electronically signed by Bartholome Bill MD Signature Date/Time: 10/27/2020/8:22:39 AM    Final         Scheduled Meds: . ferrous sulfate  325 mg Oral Daily  . furosemide  40 mg Intravenous Daily  . gabapentin  800 mg Oral BID  . hydrocortisone  25 mg Rectal BID  . insulin aspart  0-5 Units Subcutaneous QHS  . insulin aspart  0-9 Units Subcutaneous TID WC  . pantoprazole  40 mg Oral Daily  . polyethylene glycol  17 g Oral Daily  . sertraline  50 mg Oral Daily  . spironolactone  25 mg Oral Daily   Continuous Infusions: . sodium chloride Stopped (10/26/20 2305)  . cefTRIAXone (ROCEPHIN)  IV Stopped (10/26/20 2157)     LOS: 2 days    Time spent: 25 minutes    Sidney Ace, MD [facility] Pager [phone]  If 7PM-7AM, please contact night-coverage 10/27/2020, 2:00 PM

## 2020-10-28 LAB — PROTEIN, BODY FLUID (OTHER): Total Protein, Body Fluid Other: 1.4 g/dL

## 2020-10-28 LAB — GLUCOSE, CAPILLARY
Glucose-Capillary: 81 mg/dL (ref 70–99)
Glucose-Capillary: 165 mg/dL — ABNORMAL HIGH (ref 70–99)
Glucose-Capillary: 183 mg/dL — ABNORMAL HIGH (ref 70–99)
Glucose-Capillary: 190 mg/dL — ABNORMAL HIGH (ref 70–99)

## 2020-10-28 LAB — URINE CULTURE: Culture: >=100000 — AB

## 2020-10-28 MED ORDER — METHOCARBAMOL 500 MG PO TABS
500.0000 mg | ORAL_TABLET | Freq: Once | ORAL | Status: AC
  Administered 2020-10-28: 500 mg via ORAL
  Filled 2020-10-28: qty 1

## 2020-10-28 MED ORDER — FUROSEMIDE 40 MG PO TABS
40.0000 mg | ORAL_TABLET | Freq: Two times a day (BID) | ORAL | Status: DC
  Administered 2020-10-28 – 2020-10-30 (×4): 40 mg via ORAL
  Filled 2020-10-28 (×5): qty 1

## 2020-10-28 MED ORDER — CEFTRIAXONE SODIUM 1 G IJ SOLR
1.0000 g | INTRAMUSCULAR | Status: AC
  Administered 2020-10-28: 1 g via INTRAVENOUS
  Filled 2020-10-28: qty 1

## 2020-10-28 NOTE — Progress Notes (Signed)
PROGRESS NOTE    KRISTELLE CAVALLARO  EGB:[id number] DOB: 1955-10-16 DOA: 10/25/2020 PCP: [name], MD  Brief Narrative:   65 y.o. female with medical history significant for rectal bleeding presumed to be from a hemorrhoids,hypertension, hyperlipidemia, diabetes mellitus, CKD-3, GERD, depression, anxiety, thalassemia minor, hepatitis B, neuropathy, OSA on CPAP, recent COVID-19 pneumonia, recent nondisplaced right greater trochanteric hip fracture that was managed conservatively in January.  She was recently hospitalized from 2/21-2/24 for severe acute on chronic blood loss anemia requiring up to 4 units of PRBC transfusion.  She had both EGD and colonoscopy with several polyps removed.  EGD concerning for portal hypertension gastropathy. Also noted to have AKI at that time that was stable with IV fluids.  For the past 2 weeks since her last discharge she has noted gradual increase abdominal distention and tightening of the skin on her abdomen. Has early satiety and unable to eat much. For the past two days she has also noted edema of both legs up to her thigh  Imaging significant for large volume ascites.  Patient status post ultrasound-guided paracentesis, 6 L fluid removed.  Abdominal distention improved however not resolved.  Patient has been net negative.   Assessment & Plan:   Principal Problem:   Decompensated hepatic cirrhosis (HCC) Active Problems:   Diabetes mellitus with hyperglycemia (HCC)   Hypertension   Acute renal failure superimposed on stage 3a chronic kidney disease (HCC)   Rectal bleeding   OSA (obstructive sleep apnea)   Glossitis  Decompensated liver cirrhosis with ascites/anasarca  No alcohol use. Has hx of hepatitis B in the past  Unclear etiology of cirrhosis, possibly related to prior history of hepatitis B infection [name] cirrhosis also on differential as well cryptogenic cirrhosis Status post ultrasound-guided paracentesis, 6 L removed Received  albumin bolus post paracentesis Acute hepatitis panel negative Echocardiogram reassuring Ascites fluid not indicative of SBP Plan: Increase Lasix to 40 mg p.o. twice daily Continue Aldactone 25 mg daily Daily INR Plan for repeat paracentesis if substantial ascites.  Ultrasound paracentesis ordered  AKI on CKD3a, resolved Multifactorial etiology, ATN versus prerenal azotemia.  Now resolved.  Glossitis Could be secondary to cirrhosis or vitamin deficiency Folate normal B12 normal Iron stores low Unclear etiology.  Possibly secondary to cirrhosis.  I am receiving treatment for iron deficiency anemia.  Iron deficiency anemia Daily p.o. iron supplementation   Asymptomatic bacteriuria Patient received IV Rocephin in the ED for positive UA but she denies any dysuria or urgency.  She is actually had decreased urine output which I suspect could be due to her third spacing from cirrhosis. Last dose of Rocephin today 3/13 No indication of SBP  OSA Continue CPAP  HTN Hold HCTZ due to AKI  Hx of rectal bleed with anemia No active bleed. Hgb stable around 8 Continue bowel regimen daily Continue iron supplementation Medication for transfusion  Type 2 DM  Hemoglobin A1c 6.9 on 1/17 Placed on sensitive sliding scale   DVT prophylaxis: SCD Code Status: Full Family Communication: Attempted to call husband [name] [phone].  No answer and voicemail box not set up. Disposition Plan: Status is: Inpatient  Remains inpatient appropriate because:Inpatient level of care appropriate due to severity of illness   Dispo: The patient is from: Home              Anticipated d/c is to: Home              Patient currently is not medically stable to d/c.  Difficult to place patient No  Decompensated cirrhosis.  Status post paracentesis for ascites however still with some abdominal distention.  Anticipate need for repeat paracentesis.  Ultrasound consult placed.    Level of care:  Med-Surg  Consultants:   None  Procedures:   Paracentesis, 3/11  Antimicrobials:  Ceftriaxone   Subjective: Patient seen and examined.  No abdominal pain.  Abdominal distention improved but not yet at baseline.  Objective: Vitals:   10/27/20 1639 10/27/20 2127 10/28/20 0450 10/28/20 0846  BP: 121/67 (!) 122/59 (!) 147/69 129/66  Pulse: 99 96 94 92  Resp: 20 20 16 16   Temp: 98.3 F (36.8 C) 98.4 F (36.9 C) 98.1 F (36.7 C) 98.1 F (36.7 C)  TempSrc:  Oral Oral Oral  SpO2: 99% 100% 99% 97%  Weight:      Height:        Intake/Output Summary (Last 24 hours) at 10/28/2020 1045 Last data filed at 10/28/2020 0900 Gross per 24 hour  Intake 360 ml  Output 1600 ml  Net -1240 ml   Filed Weights   10/26/20 1840 10/27/20 0447  Weight: 122.4 kg 124.5 kg    Examination:  General exam: Appears calm and comfortable  Respiratory system: Clear to auscultation. Respiratory effort normal. Cardiovascular system: S1 & S2 heard, RRR. No JVD, murmurs, rubs, gallops or clicks. No pedal edema. Gastrointestinal system: Mildly distended.  Nontender to palpation.   Central nervous system: Alert and oriented. No focal neurological deficits. Extremities: Symmetric 5 x 5 power. Skin: No rashes, lesions or ulcers Psychiatry: Judgement and insight appear normal. Mood & affect appropriate.     Data Reviewed: I have personally reviewed following labs and imaging studies  CBC: Recent Labs  Lab 10/25/20 1547 10/26/20 0545 10/27/20 0738  WBC 8.3 6.4 4.6  NEUTROABS  --   --  2.4  HGB 8.1* 7.6* 7.8*  HCT 28.7* 25.4* 26.1*  MCV 69.0* 66.0* 66.1*  PLT 294 238 165   Basic Metabolic Panel: Recent Labs  Lab 10/25/20 1547 10/26/20 0545 10/27/20 0738  NA 136 138 137  K 4.6 3.8 3.4*  CL 108 109 107  CO2 18* 21* 22  GLUCOSE 183* 72 78  BUN 28* 26* 21  CREATININE 1.23* 1.29* 0.92  CALCIUM 8.7* 8.7* 8.5*  MG 2.0  --  1.8   GFR: Estimated Creatinine Clearance: 92.4 mL/min (by C-G  formula based on SCr of 0.92 mg/dL). Liver Function Tests: Recent Labs  Lab 10/25/20 1547 10/26/20 0545 10/27/20 0738  AST 32 30 25  ALT 24 22 20   ALKPHOS 97 94 84  BILITOT 1.3* 1.0 0.9  PROT 6.2* 5.9* 5.5*  ALBUMIN 2.9* 2.7* 2.6*   Recent Labs  Lab 10/25/20 1547  LIPASE 44   No results for input(s): AMMONIA in the last 168 hours. Coagulation Profile: Recent Labs  Lab 10/26/20 0545  INR 1.2   Cardiac Enzymes: No results for input(s): CKTOTAL, CKMB, CKMBINDEX, TROPONINI in the last 168 hours. BNP (last 3 results) No results for input(s): PROBNP in the last 8760 hours. HbA1C: No results for input(s): HGBA1C in the last 72 hours. CBG: Recent Labs  Lab 10/27/20 0758 10/27/20 1134 10/27/20 1644 10/27/20 2009 10/28/20 0808  GLUCAP 74 147* 199* 221* 81   Lipid Profile: No results for input(s): CHOL, HDL, LDLCALC, TRIG, CHOLHDL, LDLDIRECT in the last 72 hours. Thyroid Function Tests: Recent Labs    10/25/20 1547  TSH 1.793  FREET4 1.46*   Anemia Panel: Recent Labs  10/26/20 0545  VITAMINB12 339  FOLATE 15.1  TIBC 318  IRON 15*   Sepsis Labs: No results for input(s): PROCALCITON, LATICACIDVEN in the last 168 hours.  Recent Results (from the past 240 hour(s))  Urine Culture     Status: Abnormal   Collection Time: 10/25/20  8:26 PM   Specimen: Urine, Random  Result Value Ref Range Status   Specimen Description   Final    URINE, RANDOM Performed at [facility], [address], [place], [place] [address]    Special Requests   Final    NONE Performed at [facility], [address] [place], [place] [address]    Culture >=100,000 COLONIES/mL KLEBSIELLA PNEUMONIAE (A)  Final   Report Status 10/28/2020 FINAL  Final   Organism ID, Bacteria KLEBSIELLA PNEUMONIAE (A)  Final      Susceptibility   Klebsiella pneumoniae - MIC*    AMPICILLIN RESISTANT Resistant     CEFAZOLIN <=4 SENSITIVE Sensitive     CEFEPIME <=0.12 SENSITIVE  Sensitive     CEFTRIAXONE <=0.25 SENSITIVE Sensitive     CIPROFLOXACIN <=0.25 SENSITIVE Sensitive     GENTAMICIN <=1 SENSITIVE Sensitive     IMIPENEM <=0.25 SENSITIVE Sensitive     NITROFURANTOIN 32 SENSITIVE Sensitive     TRIMETH/SULFA <=20 SENSITIVE Sensitive     AMPICILLIN/SULBACTAM <=2 SENSITIVE Sensitive     PIP/TAZO <=4 SENSITIVE Sensitive     * >=100,000 COLONIES/mL KLEBSIELLA PNEUMONIAE  Aerobic/Anaerobic Culture w Gram Stain (surgical/deep wound)     Status: None (Preliminary result)   Collection Time: 10/26/20 12:20 PM   Specimen: PATH Cytology Peritoneal fluid  Result Value Ref Range Status   Specimen Description   Final    PERITONEAL Performed at [facility], [address] [place], [place] [address]    Special Requests   Final    PERITONEAL Performed at [facility], [address] [place], [place] [address]    Gram Stain NO WBC SEEN NO ORGANISMS SEEN   Final   Culture   Final    NO GROWTH < 24 HOURS Performed at [facility], [place] [address], [place], [place] [address]    Report Status PENDING  Incomplete         Radiology Studies: US Paracentesis  Result Date: 10/26/2020 INDICATION: Patient with a new diagnosis of cirrhosis presents today to IR for a therapeutic and diagnostic paracentesis. EXAM: ULTRASOUND GUIDED PARACENTESIS MEDICATIONS: 1% lidocaine 10 mL COMPLICATIONS: None immediate. PROCEDURE: Informed written consent was obtained from the patient after a discussion of the risks, benefits and alternatives to treatment. A timeout was performed prior to the initiation of the procedure. Initial ultrasound scanning demonstrates a large amount of ascites within the right lower abdominal quadrant. The right lower abdomen was prepped and draped in the usual sterile fashion. 1% lidocaine was used for local anesthesia. Following this, a 19 gauge, 7-cm, Yueh catheter was introduced. An ultrasound image was saved for documentation purposes.  The paracentesis was performed. The catheter was removed and a dressing was applied. The patient tolerated the procedure well without immediate post procedural complication. FINDINGS: A total of approximately 6 L of clear yellow fluid was removed. Samples were sent to the laboratory as requested by the clinical team. IMPRESSION: Successful ultrasound-guided paracentesis yielding 6 liters of peritoneal fluid. Read by: Soyla Dryer, NP Electronically Signed   By: Miachel Roux M.D.   On: 10/26/2020 13:17   ECHOCARDIOGRAM COMPLETE  Result Date: 10/27/2020  ECHOCARDIOGRAM REPORT   Patient Name:   Ronique A Virtue Date of Exam: 10/26/2020 Medical Rec #:  [id number]      Height:       71.0 in Accession #:    [id number]     Weight:       259.0 lb Date of Birth:  Mar 25, 1956       BSA:          2.353 m Patient Age:    66 years       BP:           140/60 mmHg Patient Gender: F              HR:           99 bpm. Exam Location:  [facility] Procedure: 2D Echo, Color Doppler and Cardiac Doppler Indications:     R06.00 Dyspnea  History:         Patient has no prior history of Echocardiogram examinations.                  Risk Factors:Sleep Apnea, Diabetes and Hypertension.  Sonographer:     Charmayne Sheer RDCS (AE) Referring Phys:  [id number] Angela Burke TU Diagnosing Phys: Bartholome Bill MD  Sonographer Comments: No subcostal window. Image acquisition challenging due to respiratory motion and Image acquisition challenging due to patient body habitus. Global longitudinal strain was attempted. IMPRESSIONS  1. Left ventricular ejection fraction, by estimation, is 55 to 60%. The left ventricle has normal function. The left ventricle has no regional wall motion abnormalities. There is mild left ventricular hypertrophy. Left ventricular diastolic parameters are consistent with Grade I diastolic dysfunction (impaired relaxation).  2. Right ventricular systolic function is normal. The right ventricular size is normal.  3. The mitral valve was not well  visualized. Trivial mitral valve regurgitation.  4. The aortic valve was not well visualized. Aortic valve regurgitation is trivial. FINDINGS  Left Ventricle: Left ventricular ejection fraction, by estimation, is 55 to 60%. The left ventricle has normal function. The left ventricle has no regional wall motion abnormalities. The left ventricular internal cavity size was normal in size. There is  mild left ventricular hypertrophy. Left ventricular diastolic parameters are consistent with Grade I diastolic dysfunction (impaired relaxation). Right Ventricle: The right ventricular size is normal. No increase in right ventricular wall thickness. Right ventricular systolic function is normal. Left Atrium: Left atrial size was normal in size. Right Atrium: Right atrial size was normal in size. Pericardium: There is no evidence of pericardial effusion. Mitral Valve: The mitral valve was not well visualized. Trivial mitral valve regurgitation. MV peak gradient, 4.0 mmHg. The mean mitral valve gradient is 2.0 mmHg. Tricuspid Valve: The tricuspid valve is not well visualized. Tricuspid valve regurgitation is trivial. Aortic Valve: The aortic valve was not well visualized. Aortic valve regurgitation is trivial. Aortic valve mean gradient measures 5.0 mmHg. Aortic valve peak gradient measures 10.1 mmHg. Aortic valve area, by VTI measures 3.92 cm. Pulmonic Valve: The pulmonic valve was not well visualized. Pulmonic valve regurgitation is trivial. Aorta: The aortic root is normal in size and structure. IAS/Shunts: The interatrial septum was not well visualized.  LEFT VENTRICLE PLAX 2D LVIDd:         4.50 cm  Diastology LVIDs:         3.30 cm  LV e' medial:    9.25 cm/s LV PW:         1.10 cm  LV E/e' medial:  7.1 LV  IVS:        1.00 cm  LV e' lateral:   9.68 cm/s LVOT diam:     2.30 cm  LV E/e' lateral: 6.8 LV SV:         103 LV SV Index:   44 LVOT Area:     4.15 cm  RIGHT VENTRICLE RV Basal diam:  2.90 cm LEFT ATRIUM              Index LA diam:        4.00 cm 1.70 cm/m LA Vol (A2C):   39.1 ml 16.61 ml/m LA Vol (A4C):   55.4 ml 23.54 ml/m LA Biplane Vol: 50.5 ml 21.46 ml/m  AORTIC VALVE                    PULMONIC VALVE AV Area (Vmax):    3.50 cm     PV Vmax:       1.23 m/s AV Area (Vmean):   3.39 cm     PV Vmean:      90.500 cm/s AV Area (VTI):     3.92 cm     PV VTI:        0.215 m AV Vmax:           159.00 cm/s  PV Peak grad:  6.1 mmHg AV Vmean:          105.000 cm/s PV Mean grad:  4.0 mmHg AV VTI:            0.262 m AV Peak Grad:      10.1 mmHg AV Mean Grad:      5.0 mmHg LVOT Vmax:         134.00 cm/s LVOT Vmean:        85.700 cm/s LVOT VTI:          0.247 m LVOT/AV VTI ratio: 0.94  AORTA Ao Root diam: 2.90 cm MITRAL VALVE MV Area (PHT): 4.46 cm    SHUNTS MV Area VTI:   5.18 cm    Systemic VTI:  0.25 m MV Peak grad:  4.0 mmHg    Systemic Diam: 2.30 cm MV Mean grad:  2.0 mmHg MV Vmax:       1.00 m/s MV Vmean:      68.9 cm/s MV Decel Time: 170 msec MV E velocity: 65.60 cm/s MV A velocity: 93.00 cm/s MV E/A ratio:  0.71 Bartholome Bill MD Electronically signed by Bartholome Bill MD Signature Date/Time: 10/27/2020/8:22:39 AM    Final         Scheduled Meds: . ferrous sulfate  325 mg Oral Daily  . furosemide  40 mg Oral BID  . gabapentin  800 mg Oral BID  . hydrocortisone  25 mg Rectal BID  . insulin aspart  0-5 Units Subcutaneous QHS  . insulin aspart  0-9 Units Subcutaneous TID WC  . pantoprazole  40 mg Oral Daily  . polyethylene glycol  17 g Oral Daily  . sertraline  50 mg Oral Daily  . spironolactone  25 mg Oral Daily   Continuous Infusions: . sodium chloride Stopped (10/26/20 2305)  . cefTRIAXone (ROCEPHIN)  IV 2 g (10/27/20 2025)     LOS: 3 days    Time spent: 25 minutes    Sidney Ace, MD [facility] Pager [phone]  If 7PM-7AM, please contact night-coverage 10/28/2020, 10:45 AM

## 2020-10-29 ENCOUNTER — Inpatient Hospital Stay: Payer: Managed Care, Other (non HMO)

## 2020-10-29 ENCOUNTER — Encounter (HOSPITAL_COMMUNITY): Payer: 59

## 2020-10-29 LAB — CBC WITH DIFFERENTIAL/PLATELET
Abs Immature Granulocytes: 0.02 10*3/uL (ref 0.00–0.07)
Basophils Absolute: 0 10*3/uL (ref 0.0–0.1)
Basophils Relative: 0 %
Eosinophils Absolute: 0.3 10*3/uL (ref 0.0–0.5)
Eosinophils Relative: 6 %
HCT: 25.4 % — ABNORMAL LOW (ref 36.0–46.0)
Hemoglobin: 7.2 g/dL — ABNORMAL LOW (ref 12.0–15.0)
Immature Granulocytes: 0 %
Lymphocytes Relative: 34 %
Lymphs Abs: 1.7 10*3/uL (ref 0.7–4.0)
MCH: 18.7 pg — ABNORMAL LOW (ref 26.0–34.0)
MCHC: 28.3 g/dL — ABNORMAL LOW (ref 30.0–36.0)
MCV: 66 fL — ABNORMAL LOW (ref 80.0–100.0)
Monocytes Absolute: 0.5 10*3/uL (ref 0.1–1.0)
Monocytes Relative: 10 %
Neutro Abs: 2.4 10*3/uL (ref 1.7–7.7)
Neutrophils Relative %: 50 %
Platelets: 200 10*3/uL (ref 150–400)
RBC: 3.85 MIL/uL — ABNORMAL LOW (ref 3.87–5.11)
RDW: 27.9 % — ABNORMAL HIGH (ref 11.5–15.5)
Smear Review: DECREASED
WBC: 4.9 10*3/uL (ref 4.0–10.5)
nRBC: 0 % (ref 0.0–0.2)

## 2020-10-29 LAB — COMPREHENSIVE METABOLIC PANEL
ALT: 20 U/L (ref 0–44)
AST: 28 U/L (ref 15–41)
Albumin: 2.6 g/dL — ABNORMAL LOW (ref 3.5–5.0)
Alkaline Phosphatase: 89 U/L (ref 38–126)
Anion gap: 7 (ref 5–15)
BUN: 14 mg/dL (ref 8–23)
CO2: 26 mmol/L (ref 22–32)
Calcium: 8.4 mg/dL — ABNORMAL LOW (ref 8.9–10.3)
Chloride: 106 mmol/L (ref 98–111)
Creatinine, Ser: 0.92 mg/dL (ref 0.44–1.00)
GFR, Estimated: >60 mL/min (ref >60)
Glucose, Bld: 118 mg/dL — ABNORMAL HIGH (ref 70–99)
Potassium: 3.6 mmol/L (ref 3.5–5.1)
Sodium: 139 mmol/L (ref 135–145)
Total Bilirubin: 1 mg/dL (ref 0.3–1.2)
Total Protein: 5.6 g/dL — ABNORMAL LOW (ref 6.5–8.1)

## 2020-10-29 LAB — GLUCOSE, CAPILLARY
Glucose-Capillary: 114 mg/dL — ABNORMAL HIGH (ref 70–99)
Glucose-Capillary: 199 mg/dL — ABNORMAL HIGH (ref 70–99)
Glucose-Capillary: 232 mg/dL — ABNORMAL HIGH (ref 70–99)
Glucose-Capillary: 236 mg/dL — ABNORMAL HIGH (ref 70–99)

## 2020-10-29 LAB — PH, BODY FLUID: pH, Body Fluid: 7.6

## 2020-10-29 LAB — CYTOLOGY - NON PAP

## 2020-10-29 MED ORDER — ALBUMIN HUMAN 25 % IV SOLN
12.5000 g | Freq: Once | INTRAVENOUS | Status: AC
  Administered 2020-10-29: 12.5 g via INTRAVENOUS
  Filled 2020-10-29: qty 50

## 2020-10-29 NOTE — Progress Notes (Signed)
OT Cancellation Note  Patient Details Name: Rachel Huber MRN: [id number] DOB: 06-19-1956   Cancelled Treatment:    Reason Eval/Treat Not Completed: OT screened, no needs identified, will sign off. Pt ambulating at mod I level with RW per PT evaluation and per conversation with PT has no skilled OT needs at this time. OT to SIGN OFF.   Darleen Crocker, MS, OTR/L , CBIS ascom [phone]  10/29/20, 9:35 AM   10/29/2020, 9:33 AM

## 2020-10-29 NOTE — Evaluation (Signed)
Physical Therapy Evaluation Patient Details Name: Rachel Huber MRN: [id number] DOB: Nov 18, 1955 Today's Date: 10/29/2020   History of Present Illness  Pt is a 65 y.o. female with medical history significant for rectal bleeding presumed to be from a hemorrhoids, hypertension, hyperlipidemia, diabetes mellitus, CKD-3, GERD, depression, anxiety, thalassemia minor, hepatitis B, neuropathy, OSA on CPAP,  recent COVID-19 pneumonia, recent nondisplaced right greater trochanteric hip fracture that was managed conservatively in January. Recently hospitalized from 2/21-2/24 for severe acute on chronic blood loss anemia requiring up to 4 units of PRBC transfusion. Pt complained increased abdominal distention since discharge. During this admission underwent paracentesis and scheduled to undergo a second.    Clinical Impression  Pt A&Ox4, reported mild pain in abdomen that has been improved since admission. Reported that since her last hospital admission she has been using her RW, has had outpatient PT, and a husband who is available to assist as needed. Pt performed transfers and ambulation with RW modI. Trialed single UE support with hallway rails at pt request, no LOB noted. Pt would like to utilize a cane upon discharge. Pt educated about potential benefit of HHPT/outpatient PT, pt to decide if she would like follow up services. The patient would benefit from further skilled PT intervention to maximize independence, function, and activity tolerance/endurance.      Follow Up Recommendations Home health PT;Outpatient PT    Equipment Recommendations  None recommended by PT    Recommendations for Other Services       Precautions / Restrictions Precautions Precautions: Fall Restrictions Weight Bearing Restrictions: No      Mobility  Bed Mobility Overal bed mobility: Independent                  Transfers Overall transfer level: Modified independent Equipment used: Rolling walker (2  wheeled)                Ambulation/Gait Ambulation/Gait assistance: Modified independent (Device/Increase time);Min guard Gait Distance (Feet): 130 Feet Assistive device: Rolling walker (2 wheeled);1 person hand held assist       General Gait Details: modI to ambulate with RW; attempted single UE support with handrails in hallway pt able to complete with CGA. Without rails, handheld assist provided and gait velocity decreased.  Stairs            Wheelchair Mobility    Modified Rankin (Stroke Patients Only)       Balance Overall balance assessment: Needs assistance Sitting-balance support: Feet supported;No upper extremity supported Sitting balance-Leahy Scale: Normal       Standing balance-Leahy Scale: Good                               Pertinent Vitals/Pain Pain Assessment: Faces Faces Pain Scale: No hurt Pain Location: Pt referenced mild abdominal pain, pain is decreased compared to admission Pain Intervention(s): Monitored during session    Home Living Family/patient expects to be discharged to:: Private residence Living Arrangements: Spouse/significant other Available Help at Discharge: Family;Available 24 hours/day (husband recently retired) Type of Home: House Home Access: Stairs to enter Entrance Stairs-Rails: None Technical brewer of Steps: 1+1, 2 steps into den in home Home Layout: One level Home Equipment: Environmental consultant - 2 wheels;Cane - single point      Prior Function Level of Independence: Independent with assistive device(s)         Comments: since hosital admission has  been using RW, interested in attempting to use  cane. Recently had outpatient PT.     Hand Dominance        Extremity/Trunk Assessment   Upper Extremity Assessment Upper Extremity Assessment: Overall WFL for tasks assessed    Lower Extremity Assessment Lower Extremity Assessment: Overall WFL for tasks assessed    Cervical / Trunk  Assessment Cervical / Trunk Assessment: Normal  Communication   Communication: No difficulties  Cognition Arousal/Alertness: Awake/alert Behavior During Therapy: WFL for tasks assessed/performed Overall Cognitive Status: Within Functional Limits for tasks assessed                                        General Comments      Exercises Other Exercises Other Exercises: Pt ambulated into bathroom modI with RW at end of session. Other Exercises: RN in room to assess pt and transport awaiting to take patient to procedure, further mobility deferred   Assessment/Plan    PT Assessment Patient needs continued PT services  PT Problem List Decreased mobility;Decreased strength;Decreased activity tolerance;Decreased balance;Decreased knowledge of use of DME       PT Treatment Interventions DME instruction;Therapeutic exercise;Gait training;Stair training;Therapeutic activities;Patient/family education;Neuromuscular re-education;Balance training    PT Goals (Current goals can be found in the Care Plan section)  Acute Rehab PT Goals Patient Stated Goal: to go home PT Goal Formulation: With patient Time For Goal Achievement: 11/12/20 Potential to Achieve Goals: Good    Frequency Min 2X/week   Barriers to discharge        Co-evaluation               AM-PAC PT "6 Clicks" Mobility  Outcome Measure Help needed turning from your back to your side while in a flat bed without using bedrails?: None Help needed moving from lying on your back to sitting on the side of a flat bed without using bedrails?: None Help needed moving to and from a bed to a chair (including a wheelchair)?: None Help needed standing up from a chair using your arms (e.g., wheelchair or bedside chair)?: None Help needed to walk in hospital room?: None Help needed climbing 3-5 steps with a railing? : None 6 Click Score: 24    End of Session Equipment Utilized During Treatment: Gait belt Activity  Tolerance: Patient tolerated treatment well Patient left: Other (comment) (with bathroom with RN and transport in room) Nurse Communication: Mobility status PT Visit Diagnosis: Other abnormalities of gait and mobility (R26.89);Difficulty in walking, not elsewhere classified (R26.2)    Time: 8270-7867 PT Time Calculation (min) (ACUTE ONLY): 10 min   Charges:   PT Evaluation $PT Eval Low Complexity: 1 Low         Lieutenant Diego PT, DPT 9:28 AM,10/29/20

## 2020-10-29 NOTE — Procedures (Signed)
Pre Procedural Dx: Symptomatic Ascites Post Procedural Dx: Same  Successful US guided paracentesis yielding 5 L of serous ascitic fluid. Sample sent to laboratory as requested.  EBL: None  Complications: None immediate  Ronny Bacon, MD Pager #: [phone]

## 2020-10-29 NOTE — Progress Notes (Signed)
PROGRESS NOTE    Rachel Huber  GYB:[id number] DOB: May 20, 1956 DOA: 10/25/2020 PCP: [name], MD  Brief Narrative:   65 y.o. female with medical history significant for rectal bleeding presumed to be from a hemorrhoids,hypertension, hyperlipidemia, diabetes mellitus, CKD-3, GERD, depression, anxiety, thalassemia minor, hepatitis B, neuropathy, OSA on CPAP, recent COVID-19 pneumonia, recent nondisplaced right greater trochanteric hip fracture that was managed conservatively in January.  She was recently hospitalized from 2/21-2/24 for severe acute on chronic blood loss anemia requiring up to 4 units of PRBC transfusion.  She had both EGD and colonoscopy with several polyps removed.  EGD concerning for portal hypertension gastropathy. Also noted to have AKI at that time that was stable with IV fluids.  For the past 2 weeks since her last discharge she has noted gradual increase abdominal distention and tightening of the skin on her abdomen. Has early satiety and unable to eat much. For the past two days she has also noted edema of both legs up to her thigh  Imaging significant for large volume ascites.  Patient status post ultrasound-guided paracentesis, 6 L fluid removed.  Consult placed for reevaluation for repeat paracentesis.'s status post excessive ultrasound-guided paracentesis yielding 5 L serous ascitic fluid.  No complications.  Patient was notably fatigued post procedurally.   Assessment & Plan:   Principal Problem:   Decompensated hepatic cirrhosis (HCC) Active Problems:   Diabetes mellitus with hyperglycemia (HCC)   Hypertension   Acute renal failure superimposed on stage 3a chronic kidney disease (HCC)   Rectal bleeding   OSA (obstructive sleep apnea)   Glossitis  Decompensated liver cirrhosis with ascites/anasarca  No alcohol use. Has hx of hepatitis B in the past  Unclear etiology of cirrhosis, possibly related to prior history of hepatitis B  infection [name] cirrhosis also on differential as well cryptogenic cirrhosis Status post ultrasound-guided paracentesis, 6 L removed Received albumin bolus post paracentesis Acute hepatitis panel negative Echocardiogram reassuring Ascites fluid not indicative of SBP -3/14: Repeat paracentesis, 5 L ascitic fluid Plan: Abdomen 25% 12.5 mg x 1 Continue twice daily Lasix 40 mg Continue Aldactone 25 mg daily Daily INR Plan on discharge tomorrow morning  AKI on CKD3a, resolved Multifactorial etiology, ATN versus prerenal azotemia.  Now resolved.  Glossitis Could be secondary to cirrhosis or vitamin deficiency Folate normal B12 normal Iron stores low Unclear etiology.  Possibly secondary to cirrhosis.  I am receiving treatment for iron deficiency anemia.  Iron deficiency anemia Daily p.o. iron supplementation   Asymptomatic bacteriuria Patient received IV Rocephin in the ED for positive UA but she denies any dysuria or urgency.  She is actually had decreased urine output which I suspect could be due to her third spacing from cirrhosis. Last dose of Rocephin today 3/13 No indication of SBP  OSA Continue CPAP  HTN Hold HCTZ due to AKI  Hx of rectal bleed with anemia No active bleed. Hgb stable around 8 Continue bowel regimen daily Continue iron supplementation Medication for transfusion  Type 2 DM  Hemoglobin A1c 6.9 on 1/17 Placed on sensitive sliding scale   DVT prophylaxis: SCD Code Status: Full Family Communication: husband [name] [phone] on 3/14 Disposition Plan: Status is: Inpatient  Remains inpatient appropriate because:Inpatient level of care appropriate due to severity of illness   Dispo: The patient is from: Home              Anticipated d/c is to: Home  Patient currently is not medically stable to d/c.   Difficult to place patient No  Decompensated cirrhosis.  Significant fatigue after second paracentesis.  5 L removed.  We will  plan to monitor overnight and discharge home on 10/30/2020    Level of care: Med-Surg  Consultants:   None  Procedures:   Paracentesis, 3/11  Paracentesis, 3/14  Antimicrobials:  Ceftriaxone   Subjective: Patient seen and examined.  Improved abdominal distention and lower extremity edema.  No pain complaints  Objective: Vitals:   10/29/20 1023 10/29/20 1048 10/29/20 1119 10/29/20 1507  BP: (!) 105/58 (!) 136/57 136/63 126/63  Pulse:  95 97 92  Resp: 18 19    Temp:  98.2 F (36.8 C) 97.9 F (36.6 C) 98.1 F (36.7 C)  TempSrc:  Oral    SpO2: 96% 98% 99% 99%  Weight:      Height:        Intake/Output Summary (Last 24 hours) at 10/29/2020 1632 Last data filed at 10/29/2020 1554 Gross per 24 hour  Intake 172.62 ml  Output 1780 ml  Net -1607.38 ml   Filed Weights   10/26/20 1840 10/27/20 0447  Weight: 122.4 kg 124.5 kg    Examination:  General exam: Appears calm and comfortable  Respiratory system: Clear to auscultation. Respiratory effort normal. Cardiovascular system: S1 & S2 heard, RRR. No JVD, murmurs, rubs, gallops or clicks. No pedal edema. Gastrointestinal system: Mildly distended.  Nontender to palpation.   Central nervous system: Alert and oriented. No focal neurological deficits. Extremities: Symmetric 5 x 5 power. Skin: No rashes, lesions or ulcers Psychiatry: Judgement and insight appear normal. Mood & affect appropriate.     Data Reviewed: I have personally reviewed following labs and imaging studies  CBC: Recent Labs  Lab 10/25/20 1547 10/26/20 0545 10/27/20 0738 10/29/20 0643  WBC 8.3 6.4 4.6 4.9  NEUTROABS  --   --  2.4 2.4  HGB 8.1* 7.6* 7.8* 7.2*  HCT 28.7* 25.4* 26.1* 25.4*  MCV 69.0* 66.0* 66.1* 66.0*  PLT 294 238 219 229   Basic Metabolic Panel: Recent Labs  Lab 10/25/20 1547 10/26/20 0545 10/27/20 0738 10/29/20 0643  NA 136 138 137 139  K 4.6 3.8 3.4* 3.6  CL 108 109 107 106  CO2 18* 21* 22 26  GLUCOSE 183* 72 78  118*  BUN 28* 26* 21 14  CREATININE 1.23* 1.29* 0.92 0.92  CALCIUM 8.7* 8.7* 8.5* 8.4*  MG 2.0  --  1.8  --    GFR: Estimated Creatinine Clearance: 92.4 mL/min (by C-G formula based on SCr of 0.92 mg/dL). Liver Function Tests: Recent Labs  Lab 10/25/20 1547 10/26/20 0545 10/27/20 0738 10/29/20 0643  AST 32 30 25 28   ALT 24 22 20 20   ALKPHOS 97 94 84 89  BILITOT 1.3* 1.0 0.9 1.0  PROT 6.2* 5.9* 5.5* 5.6*  ALBUMIN 2.9* 2.7* 2.6* 2.6*   Recent Labs  Lab 10/25/20 1547  LIPASE 44   No results for input(s): AMMONIA in the last 168 hours. Coagulation Profile: Recent Labs  Lab 10/26/20 0545  INR 1.2   Cardiac Enzymes: No results for input(s): CKTOTAL, CKMB, CKMBINDEX, TROPONINI in the last 168 hours. BNP (last 3 results) No results for input(s): PROBNP in the last 8760 hours. HbA1C: No results for input(s): HGBA1C in the last 72 hours. CBG: Recent Labs  Lab 10/28/20 1656 10/28/20 2026 10/29/20 0730 10/29/20 1119 10/29/20 1610  GLUCAP 183* 190* 114* 199* 232*   Lipid Profile:  No results for input(s): CHOL, HDL, LDLCALC, TRIG, CHOLHDL, LDLDIRECT in the last 72 hours. Thyroid Function Tests: No results for input(s): TSH, T4TOTAL, FREET4, T3FREE, THYROIDAB in the last 72 hours. Anemia Panel: No results for input(s): VITAMINB12, FOLATE, FERRITIN, TIBC, IRON, RETICCTPCT in the last 72 hours. Sepsis Labs: No results for input(s): PROCALCITON, LATICACIDVEN in the last 168 hours.  Recent Results (from the past 240 hour(s))  Urine Culture     Status: Abnormal   Collection Time: 10/25/20  8:26 PM   Specimen: Urine, Random  Result Value Ref Range Status   Specimen Description   Final    URINE, RANDOM Performed at [facility], [address] [place], [place] [address]    Special Requests   Final    NONE Performed at [facility], [address] [place], [place] [address]    Culture >=100,000 COLONIES/mL KLEBSIELLA PNEUMONIAE (A)  Final    Report Status 10/28/2020 FINAL  Final   Organism ID, Bacteria KLEBSIELLA PNEUMONIAE (A)  Final      Susceptibility   Klebsiella pneumoniae - MIC*    AMPICILLIN RESISTANT Resistant     CEFAZOLIN <=4 SENSITIVE Sensitive     CEFEPIME <=0.12 SENSITIVE Sensitive     CEFTRIAXONE <=0.25 SENSITIVE Sensitive     CIPROFLOXACIN <=0.25 SENSITIVE Sensitive     GENTAMICIN <=1 SENSITIVE Sensitive     IMIPENEM <=0.25 SENSITIVE Sensitive     NITROFURANTOIN 32 SENSITIVE Sensitive     TRIMETH/SULFA <=20 SENSITIVE Sensitive     AMPICILLIN/SULBACTAM <=2 SENSITIVE Sensitive     PIP/TAZO <=4 SENSITIVE Sensitive     * >=100,000 COLONIES/mL KLEBSIELLA PNEUMONIAE  Aerobic/Anaerobic Culture w Gram Stain (surgical/deep wound)     Status: None (Preliminary result)   Collection Time: 10/26/20 12:20 PM   Specimen: PATH Cytology Peritoneal fluid  Result Value Ref Range Status   Specimen Description   Final    PERITONEAL Performed at [facility], [address], [place], [place] [address]    Special Requests   Final    PERITONEAL Performed at [facility], [address] [place], [place] [address]    Gram Stain NO WBC SEEN NO ORGANISMS SEEN   Final   Culture   Final    NO GROWTH 2 DAYS NO ANAEROBES ISOLATED; CULTURE IN PROGRESS FOR 5 DAYS Performed at [facility] [address] [place], [place] [address]    Report Status PENDING  Incomplete         Radiology Studies: US Paracentesis  Result Date: 10/29/2020 INDICATION: History of cirrhosis with recurrent symptomatic ascites. Please perform ultrasound-guided thoracentesis for therapeutic purposes. EXAM: ULTRASOUND-GUIDED PARACENTESIS COMPARISON:  Ultrasound-guided paracentesis-10/26/2020 (yielding 6 L); CT abdomen and pelvis-10/25/2020 MEDICATIONS: None. COMPLICATIONS: None immediate. TECHNIQUE: Informed written consent was obtained from the patient after a discussion of the risks, benefits and alternatives to treatment. A  timeout was performed prior to the initiation of the procedure. Initial ultrasound scanning demonstrates a moderate amount of ascites within the right lower abdomen which was subsequently prepped and draped in the usual sterile fashion. 1% lidocaine with epinephrine was used for local anesthesia. An ultrasound image was saved for documentation purposed. An 8 Fr Safe-T-Centesis catheter was introduced. The paracentesis was performed. The catheter was removed and a dressing was applied. The patient tolerated the procedure well without immediate post procedural complication. FINDINGS: A total of approximately 5 liters of serous fluid was removed. IMPRESSION: Successful ultrasound-guided paracentesis yielding 5 liters of peritoneal fluid.  Electronically Signed   By: Sandi Mariscal M.D.   On: 10/29/2020 10:35        Scheduled Meds: . ferrous sulfate  325 mg Oral Daily  . furosemide  40 mg Oral BID  . gabapentin  800 mg Oral BID  . hydrocortisone  25 mg Rectal BID  . insulin aspart  0-5 Units Subcutaneous QHS  . insulin aspart  0-9 Units Subcutaneous TID WC  . pantoprazole  40 mg Oral Daily  . polyethylene glycol  17 g Oral Daily  . sertraline  50 mg Oral Daily  . spironolactone  25 mg Oral Daily   Continuous Infusions: . sodium chloride 10 mL/hr at 10/29/20 1554     LOS: 4 days    Time spent: 15 minutes    Sidney Ace, MD [facility] Pager [phone]  If 7PM-7AM, please contact night-coverage 10/29/2020, 4:32 PM

## 2020-10-30 ENCOUNTER — Encounter: Payer: Self-pay | Admitting: *Deleted

## 2020-10-30 ENCOUNTER — Other Ambulatory Visit: Payer: Self-pay | Admitting: Obstetrics & Gynecology

## 2020-10-30 DIAGNOSIS — N6310 Unspecified lump in the right breast, unspecified quadrant: Secondary | ICD-10-CM

## 2020-10-30 LAB — GLUCOSE, CAPILLARY: Glucose-Capillary: 115 mg/dL — ABNORMAL HIGH (ref 70–99)

## 2020-10-30 LAB — VITAMIN B6: Vitamin B6: 8.8 ug/L (ref 2.0–32.8)

## 2020-10-30 MED ORDER — SPIRONOLACTONE 25 MG PO TABS: 25.0000 mg | ORAL_TABLET | Freq: Every day | ORAL | 0 refills | Status: DC

## 2020-10-30 MED ORDER — FUROSEMIDE 40 MG PO TABS: 40.0000 mg | ORAL_TABLET | Freq: Every day | ORAL | 0 refills | Status: DC

## 2020-10-30 NOTE — Progress Notes (Signed)
MD ordered patient to be discharged home.  Discharge instructions were reviewed with the patient and she voiced understanding.  Follow-up appointment was made.  Prescriptions sent to the patients pharmacy.  IV was removed with catheter intact.  All patients questions were answered.  Patient left via wheelchair escorted by auxillary.

## 2020-10-30 NOTE — Discharge Summary (Signed)
Physician Discharge Summary  Rachel Huber ZHY:[id number] DOB: 12/05/1955 DOA: 10/25/2020  PCP: Rachel Ruths, MD  Admit date: 10/25/2020 Discharge date: 10/30/2020  Admitted From: Home Disposition: Home  Recommendations for Outpatient Follow-up:  1. Follow up with PCP in 1-2 weeks 2. Follow-up with GI soon as possible  Home Health: No Equipment/Devices: None Discharge Condition: Stable CODE STATUS: Full Diet recommendation: Low-sodium Brief/Interim Summary:65 y.o.femalewith medical history significant forrectal bleeding presumed to be from a hemorrhoids,hypertension, hyperlipidemia, diabetes mellitus, CKD-3, GERD, depression, anxiety, thalassemia minor, hepatitisB, neuropathy, OSAon CPAP,recent COVID-19 pneumonia, recent nondisplaced right greater trochanteric hip fracture that was managed conservatively in January.  She was recently hospitalized from 2/21-2/24 for severe acute on chronic blood loss anemia requiring up to 4 units of PRBC transfusion. She had both EGD and colonoscopy with several polyps removed. EGD concerning for portal hypertension gastropathy. Also noted to have AKI at that time that was stable with IV fluids.  For the past 2 weeks since her last discharge she has noted gradual increase abdominal distention andtighteningof the skin on her abdomen. Has early satiety and unable to eat much.For the past two days she has also noted edema of both legs up to her thigh  Imaging significant for large volume ascites.  Patient status post ultrasound-guided paracentesis, 6 L fluid removed.  Consult placed for reevaluation for repeat paracentesis.'s status post excessive ultrasound-guided paracentesis yielding 5 L serous ascitic fluid.  No complications.  Patient was notably fatigued post procedurally.  On day of discharge patient's fatigue had resolved.  For course of admission between paracentesis and diuresis she is 14 L net negative.  Symptomatically  improved.  We had a lengthy conversation about her diagnosis of cirrhosis and the need for gastroenterology and specifically hepatology follow-up.  She expresses understanding.  Referral made to [facility] at time of discharge.  Patient is strongly encouraged to seek further evaluation with [facility] or [facility] hepatology as she potentially could be a transplant candidate.  Patient expressed understanding of all post discharge instructions.  At time of discharge will prescribe Lasix 40 mg daily and Aldactone 25 mg daily.  Patient is instructed to hold her lisinopril/hydrochlorthiazide commendation pill.   Discharge Diagnoses:  Principal Problem:   Decompensated hepatic cirrhosis (HCC) Active Problems:   Diabetes mellitus with hyperglycemia (HCC)   Hypertension   Acute renal failure superimposed on stage 3a chronic kidney disease (HCC)   Rectal bleeding   OSA (obstructive sleep apnea)   Glossitis  Decompensated liver cirrhosis with ascites/anasarca No alcohol use.Has hx of hepatitis B in the past Unclear etiology of cirrhosis, possibly related to prior history of hepatitis B infection Rachel Huber cirrhosis also on differential as well cryptogenic cirrhosis Status post ultrasound-guided paracentesis, 6 L removed Received albumin bolus post paracentesis Acute hepatitis panel negative Echocardiogram reassuring Ascites fluid not indicative of SBP -3/14: Repeat paracentesis, 5 L ascitic fluid, received albumin Okay for discharge at this time Discharge on Lasix 40 mg daily and Aldactone 25 mg daily.  Hold home lisinopril/underscore thiazide commendation pill Follow-up with PCP in 1 week.  Referral made to GI  AKI on CKD3a, resolved Multifactorial etiology, ATN versus prerenal azotemia.  Now resolved.  Glossitis Could be secondary to cirrhosis or vitamin deficiency Folate normal B12 normal Iron stores low Unclear etiology.  Possibly secondary to cirrhosis.    Iron deficiency anemia Daily p.o.  iron supplementation   Asymptomatic bacteriuria Patient received IV Rocephin in the ED for positive UA but she denies any dysuria or  urgency. She is actually had decreased urine output which I suspect could be due to her third spacing from cirrhosis. Last dose of Rocephin today 3/13 No indication of SBP  OSA Continue CPAP  HTN HoldHCTZ due to AKI  Hx of rectal bleed with anemia No active bleed. Hgb stable around 8 Continue bowel regimen daily Continue iron supplementation   Type 2 DM Hemoglobin A1c6.9 on 1/17 Can resume home regimen at discharge  Discharge Instructions  Discharge Instructions    Ambulatory referral to Gastroenterology   Complete by: As directed    Ambulatory referral to Physical Therapy   Complete by: As directed    Diet - low sodium heart healthy   Complete by: As directed    Increase activity slowly   Complete by: As directed      Allergies as of 10/30/2020      Reactions   Latex Rash   Contact rash      Medication List    STOP taking these medications   lisinopril-hydrochlorothiazide 20-25 MG tablet Commonly known as: ZESTORETIC     TAKE these medications   acetaminophen 650 MG CR tablet Commonly known as: TYLENOL Take 1,300 mg by mouth daily as needed for pain.   atorvastatin 40 MG tablet Commonly known as: LIPITOR Take 40 mg by mouth daily.   collagenase ointment Commonly known as: SANTYL Apply 1 application topically daily.   diphenhydrAMINE 25 MG tablet Commonly known as: BENADRYL Take 25 mg by mouth at bedtime.   furosemide 40 MG tablet Commonly known as: LASIX Take 1 tablet (40 mg total) by mouth daily.   gabapentin 400 MG capsule Commonly known as: NEURONTIN Take 800 mg by mouth 2 (two) times daily.   glimepiride 2 MG tablet Commonly known as: AMARYL Take 2 mg by mouth daily.   guaiFENesin-dextromethorphan 100-10 MG/5ML syrup Commonly known as: ROBITUSSIN DM Take 5 mLs by mouth every 4 (four) hours as  needed for cough.   hydrocortisone 25 MG suppository Commonly known as: ANUSOL-HC Place 1 suppository (25 mg total) rectally 2 (two) times daily.   IRON PO Take 1 tablet by mouth daily.   melatonin 5 MG Tabs Take 5 mg by mouth at bedtime as needed (sleep).   metFORMIN 500 MG 24 hr tablet Commonly known as: GLUCOPHAGE-XR Take 1,000 mg by mouth every evening.   methocarbamol 500 MG tablet Commonly known as: ROBAXIN Take 1 tablet (500 mg total) by mouth every 8 (eight) hours as needed for muscle spasms.   multivitamin with minerals tablet Take 1 tablet by mouth daily.   oxyCODONE-acetaminophen 5-325 MG tablet Commonly known as: PERCOCET/ROXICET Take 1 tablet by mouth every 6 (six) hours as needed for severe pain.   oxymetazoline 0.05 % nasal spray Commonly known as: AFRIN Place 1 spray into both nostrils 2 (two) times daily.   pantoprazole 40 MG tablet Commonly known as: PROTONIX Take 1 tablet (40 mg total) by mouth daily.   polyethylene glycol 17 g packet Commonly known as: MiraLax Take 17 g by mouth daily.   sertraline 50 MG tablet Commonly known as: ZOLOFT Take 50 mg by mouth daily.   spironolactone 25 MG tablet Commonly known as: ALDACTONE Take 1 tablet (25 mg total) by mouth daily.   Trulicity 4.5 VO/3.5KK Sopn Generic drug: Dulaglutide Inject 4.5 mg into the skin every Wednesday.       Follow-up Information    Lesly Rubenstein, MD. Go on 11/07/2020.   Specialty: Gastroenterology Why: 11am appointment Contact  information: [place] [address] [phone]              Allergies  Allergen Reactions  . Latex Rash    Contact rash    Consultations:  None   Procedures/Studies: CT Angio Chest PE W and/or Wo Contrast  Result Date: 10/25/2020 CLINICAL DATA:  Abdominal and bilateral leg swelling. EXAM: CT ANGIOGRAPHY CHEST WITH CONTRAST TECHNIQUE: Multidetector CT imaging of the chest was performed using the standard  protocol during bolus administration of intravenous contrast. Multiplanar CT image reconstructions and MIPs were obtained to evaluate the vascular anatomy. CONTRAST:  63mL OMNIPAQUE IOHEXOL 350 MG/ML SOLN COMPARISON:  September 21, 2017 FINDINGS: Cardiovascular: There is moderate severity calcification of the aortic arch without evidence of aneurysmal dilatation or dissection. The subsegmental pulmonary arteries are mildly limited in evaluation due to suboptimal opacification with intravenous contrast. No evidence of pulmonary embolism. Normal heart size. No pericardial effusion. Mediastinum/Nodes: No enlarged mediastinal, hilar, or axillary lymph nodes. Thyroid gland, trachea, and esophagus demonstrate no significant findings. Lungs/Pleura: Very mild areas of atelectasis are seen within the posterior aspect of the bilateral upper lobes and bilateral lower lobes. A 3 mm calcified lung nodule is seen within the lateral aspect of the right upper lobe. There is no evidence of acute infiltrate, pleural effusion or pneumothorax. Upper Abdomen: The liver is cirrhotic in appearance. A marked amount of ascites is seen within the abdomen Musculoskeletal: A metallic density right shoulder replacement is noted. Chronic anterolateral fourth, fifth and sixth left rib fractures are seen. Degenerative changes seen throughout the thoracic spine. Review of the MIP images confirms the above findings. IMPRESSION: 1. No evidence of pulmonary embolism. 2. Very mild bilateral upper lobe and bilateral lower lobe atelectasis. 3. Cirrhotic liver with a marked amount of ascites. Aortic Atherosclerosis (ICD10-I70.0). Electronically Signed   By: Virgina Norfolk M.D.   On: 10/25/2020 18:46   CT ABDOMEN PELVIS W CONTRAST  Result Date: 10/25/2020 CLINICAL DATA:  Abdominal and bilateral leg swelling. EXAM: CT ABDOMEN AND PELVIS WITH CONTRAST TECHNIQUE: Multidetector CT imaging of the abdomen and pelvis was performed using the standard protocol  following bolus administration of intravenous contrast. CONTRAST:  66mL OMNIPAQUE IOHEXOL 350 MG/ML SOLN COMPARISON:  None. FINDINGS: Lower chest: A mild atelectasis is seen within the bilateral lung bases. Hepatobiliary: The liver is cirrhotic in appearance. No focal liver abnormality is seen. Numerous gallstones and gallbladder sludge are seen within the lumen of an otherwise normal-appearing gallbladder. Pancreas: Unremarkable. No pancreatic ductal dilatation or surrounding inflammatory changes. Spleen: There is mild to moderate severity splenomegaly. A 1.8 cm diameter cyst is noted within the posterior aspect of the spleen. Adrenals/Urinary Tract: Adrenal glands are unremarkable. Kidneys are normal, without renal calculi, focal lesion, or hydronephrosis. Bladder is unremarkable. Stomach/Bowel: There is a very small hiatal hernia. Appendix appears normal. No evidence of bowel wall thickening, distention, or inflammatory changes. Vascular/Lymphatic: Aortic atherosclerosis. No enlarged abdominal or pelvic lymph nodes. Reproductive: Status post hysterectomy. No adnexal masses. Other: Moderate severity anasarca is seen involving the walls of the lower abdomen and pelvis. There is a large amount of abdominal and pelvic free fluid. Musculoskeletal: Multilevel degenerative changes seen throughout the lumbar spine. IMPRESSION: 1. Cirrhosis with a large amount of ascites. 2. Cholelithiasis without evidence of cholecystitis. 3. Moderate severity anasarca. 4. Very small hiatal hernia. 5. Aortic atherosclerosis. Aortic Atherosclerosis (ICD10-I70.0). Electronically Signed   By: Virgina Norfolk M.D.   On: 10/25/2020 18:57   US Paracentesis  Result  Date: 10/29/2020 INDICATION: History of cirrhosis with recurrent symptomatic ascites. Please perform ultrasound-guided thoracentesis for therapeutic purposes. EXAM: ULTRASOUND-GUIDED PARACENTESIS COMPARISON:  Ultrasound-guided paracentesis-10/26/2020 (yielding 6 L); CT abdomen  and pelvis-10/25/2020 MEDICATIONS: None. COMPLICATIONS: None immediate. TECHNIQUE: Informed written consent was obtained from the patient after a discussion of the risks, benefits and alternatives to treatment. A timeout was performed prior to the initiation of the procedure. Initial ultrasound scanning demonstrates a moderate amount of ascites within the right lower abdomen which was subsequently prepped and draped in the usual sterile fashion. 1% lidocaine with epinephrine was used for local anesthesia. An ultrasound image was saved for documentation purposed. An 8 Fr Safe-T-Centesis catheter was introduced. The paracentesis was performed. The catheter was removed and a dressing was applied. The patient tolerated the procedure well without immediate post procedural complication. FINDINGS: A total of approximately 5 liters of serous fluid was removed. IMPRESSION: Successful ultrasound-guided paracentesis yielding 5 liters of peritoneal fluid. Electronically Signed   By: Sandi Mariscal M.D.   On: 10/29/2020 10:35   US Paracentesis  Result Date: 10/26/2020 INDICATION: Patient with a new diagnosis of cirrhosis presents today to IR for a therapeutic and diagnostic paracentesis. EXAM: ULTRASOUND GUIDED PARACENTESIS MEDICATIONS: 1% lidocaine 10 mL COMPLICATIONS: None immediate. PROCEDURE: Informed written consent was obtained from the patient after a discussion of the risks, benefits and alternatives to treatment. A timeout was performed prior to the initiation of the procedure. Initial ultrasound scanning demonstrates a large amount of ascites within the right lower abdominal quadrant. The right lower abdomen was prepped and draped in the usual sterile fashion. 1% lidocaine was used for local anesthesia. Following this, a 19 gauge, 7-cm, Yueh catheter was introduced. An ultrasound image was saved for documentation purposes. The paracentesis was performed. The catheter was removed and a dressing was applied. The patient  tolerated the procedure well without immediate post procedural complication. FINDINGS: A total of approximately 6 L of clear yellow fluid was removed. Samples were sent to the laboratory as requested by the clinical team. IMPRESSION: Successful ultrasound-guided paracentesis yielding 6 liters of peritoneal fluid. Read by: Soyla Dryer, NP Electronically Signed   By: Miachel Roux M.D.   On: 10/26/2020 13:17   ECHOCARDIOGRAM COMPLETE  Result Date: 10/27/2020    ECHOCARDIOGRAM REPORT   Patient Name:   Maye A Gartland Date of Exam: 10/26/2020 Medical Rec #:  [id number]      Height:       71.0 in Accession #:    [id number]     Weight:       259.0 lb Date of Birth:  11-Feb-1956       BSA:          2.353 m Patient Age:    54 years       BP:           140/60 mmHg Patient Gender: F              HR:           99 bpm. Exam Location:  [facility] Procedure: 2D Echo, Color Doppler and Cardiac Doppler Indications:     R06.00 Dyspnea  History:         Patient has no prior history of Echocardiogram examinations.                  Risk Factors:Sleep Apnea, Diabetes and Hypertension.  Sonographer:     Charmayne Sheer RDCS (AE) Referring Phys:  [id number] [facility] Diagnosing Phys: Chrissie Noa  Fath MD  Sonographer Comments: No subcostal window. Image acquisition challenging due to respiratory motion and Image acquisition challenging due to patient body habitus. Global longitudinal strain was attempted. IMPRESSIONS  1. Left ventricular ejection fraction, by estimation, is 55 to 60%. The left ventricle has normal function. The left ventricle has no regional wall motion abnormalities. There is mild left ventricular hypertrophy. Left ventricular diastolic parameters are consistent with Grade I diastolic dysfunction (impaired relaxation).  2. Right ventricular systolic function is normal. The right ventricular size is normal.  3. The mitral valve was not well visualized. Trivial mitral valve regurgitation.  4. The aortic valve was not well visualized.  Aortic valve regurgitation is trivial. FINDINGS  Left Ventricle: Left ventricular ejection fraction, by estimation, is 55 to 60%. The left ventricle has normal function. The left ventricle has no regional wall motion abnormalities. The left ventricular internal cavity size was normal in size. There is  mild left ventricular hypertrophy. Left ventricular diastolic parameters are consistent with Grade I diastolic dysfunction (impaired relaxation). Right Ventricle: The right ventricular size is normal. No increase in right ventricular wall thickness. Right ventricular systolic function is normal. Left Atrium: Left atrial size was normal in size. Right Atrium: Right atrial size was normal in size. Pericardium: There is no evidence of pericardial effusion. Mitral Valve: The mitral valve was not well visualized. Trivial mitral valve regurgitation. MV peak gradient, 4.0 mmHg. The mean mitral valve gradient is 2.0 mmHg. Tricuspid Valve: The tricuspid valve is not well visualized. Tricuspid valve regurgitation is trivial. Aortic Valve: The aortic valve was not well visualized. Aortic valve regurgitation is trivial. Aortic valve mean gradient measures 5.0 mmHg. Aortic valve peak gradient measures 10.1 mmHg. Aortic valve area, by VTI measures 3.92 cm. Pulmonic Valve: The pulmonic valve was not well visualized. Pulmonic valve regurgitation is trivial. Aorta: The aortic root is normal in size and structure. IAS/Shunts: The interatrial septum was not well visualized.  LEFT VENTRICLE PLAX 2D LVIDd:         4.50 cm  Diastology LVIDs:         3.30 cm  LV e' medial:    9.25 cm/s LV PW:         1.10 cm  LV E/e' medial:  7.1 LV IVS:        1.00 cm  LV e' lateral:   9.68 cm/s LVOT diam:     2.30 cm  LV E/e' lateral: 6.8 LV SV:         103 LV SV Index:   44 LVOT Area:     4.15 cm  RIGHT VENTRICLE RV Basal diam:  2.90 cm LEFT ATRIUM             Index LA diam:        4.00 cm 1.70 cm/m LA Vol (A2C):   39.1 ml 16.61 ml/m LA Vol (A4C):    55.4 ml 23.54 ml/m LA Biplane Vol: 50.5 ml 21.46 ml/m  AORTIC VALVE                    PULMONIC VALVE AV Area (Vmax):    3.50 cm     PV Vmax:       1.23 m/s AV Area (Vmean):   3.39 cm     PV Vmean:      90.500 cm/s AV Area (VTI):     3.92 cm     PV VTI:        0.215 m AV Vmax:  159.00 cm/s  PV Peak grad:  6.1 mmHg AV Vmean:          105.000 cm/s PV Mean grad:  4.0 mmHg AV VTI:            0.262 m AV Peak Grad:      10.1 mmHg AV Mean Grad:      5.0 mmHg LVOT Vmax:         134.00 cm/s LVOT Vmean:        85.700 cm/s LVOT VTI:          0.247 m LVOT/AV VTI ratio: 0.94  AORTA Ao Root diam: 2.90 cm MITRAL VALVE MV Area (PHT): 4.46 cm    SHUNTS MV Area VTI:   5.18 cm    Systemic VTI:  0.25 m MV Peak grad:  4.0 mmHg    Systemic Diam: 2.30 cm MV Mean grad:  2.0 mmHg MV Vmax:       1.00 m/s MV Vmean:      68.9 cm/s MV Decel Time: 170 msec MV E velocity: 65.60 cm/s MV A velocity: 93.00 cm/s MV E/A ratio:  0.71 Bartholome Bill MD Electronically signed by Bartholome Bill MD Signature Date/Time: 10/27/2020/8:22:39 AM    Final     (Echo, Carotid, EGD, Colonoscopy, ERCP)    Subjective: Patient seen and examined on the day of discharge.  Stable, no distress.  Abdominal distention much improved.  Lower extremity edema much improved.  Discharge Exam: Vitals:   10/30/20 0535 10/30/20 0800  BP: (!) 146/65 (!) 137/58  Pulse: 89 88  Resp: 20 18  Temp: 97.7 F (36.5 C) 98.3 F (36.8 C)  SpO2: 99% 96%   Vitals:   10/29/20 2015 10/30/20 0207 10/30/20 0535 10/30/20 0800  BP: 135/63 123/69 (!) 146/65 (!) 137/58  Pulse: 90 81 89 88  Resp: 20 20 20 18   Temp: 98 F (36.7 C) 98 F (36.7 C) 97.7 F (36.5 C) 98.3 F (36.8 C)  TempSrc: Oral Oral Oral Oral  SpO2: 100% 97% 99% 96%  Weight:      Height:        General: Pt is alert, awake, not in acute distress Cardiovascular: RRR, S1/S2 +, no rubs, no gallops Respiratory: CTA bilaterally, no wheezing, no rhonchi Abdominal: Soft, nontender, mild distention,  positive bowel sounds Extremities: 1+ pitting edema bilateral lower extremities    The results of significant diagnostics from this hospitalization (including imaging, microbiology, ancillary and laboratory) are listed below for reference.     Microbiology: Recent Results (from the past 240 hour(s))  Urine Culture     Status: Abnormal   Collection Time: 10/25/20  8:26 PM   Specimen: Urine, Random  Result Value Ref Range Status   Specimen Description   Final    URINE, RANDOM Performed at [facility], [address] [place], [place] [address]    Special Requests   Final    NONE Performed at [facility], [address] [place], [place] [address]    Culture >=100,000 COLONIES/mL KLEBSIELLA PNEUMONIAE (A)  Final   Report Status 10/28/2020 FINAL  Final   Organism ID, Bacteria KLEBSIELLA PNEUMONIAE (A)  Final      Susceptibility   Klebsiella pneumoniae - MIC*    AMPICILLIN RESISTANT Resistant     CEFAZOLIN <=4 SENSITIVE Sensitive     CEFEPIME <=0.12 SENSITIVE Sensitive     CEFTRIAXONE <=0.25 SENSITIVE Sensitive     CIPROFLOXACIN <=0.25 SENSITIVE Sensitive     GENTAMICIN <=1 SENSITIVE  Sensitive     IMIPENEM <=0.25 SENSITIVE Sensitive     NITROFURANTOIN 32 SENSITIVE Sensitive     TRIMETH/SULFA <=20 SENSITIVE Sensitive     AMPICILLIN/SULBACTAM <=2 SENSITIVE Sensitive     PIP/TAZO <=4 SENSITIVE Sensitive     * >=100,000 COLONIES/mL KLEBSIELLA PNEUMONIAE  Aerobic/Anaerobic Culture w Gram Stain (surgical/deep wound)     Status: None (Preliminary result)   Collection Time: 10/26/20 12:20 PM   Specimen: PATH Cytology Peritoneal fluid  Result Value Ref Range Status   Specimen Description   Final    PERITONEAL Performed at [facility], [address], [place], [place] [address]    Special Requests   Final    PERITONEAL Performed at [facility], [address] [place], [place] [address]    Gram Stain NO WBC SEEN NO ORGANISMS SEEN    Final   Culture   Final    NO GROWTH 2 DAYS NO ANAEROBES ISOLATED; CULTURE IN PROGRESS FOR 5 DAYS Performed at [facility] [address] [place], [place] [address]    Report Status PENDING  Incomplete     Labs: BNP (last 3 results) Recent Labs    10/25/20 1547  BNP 00.1   Basic Metabolic Panel: Recent Labs  Lab 10/25/20 1547 10/26/20 0545 10/27/20 0738 10/29/20 0643  NA 136 138 137 139  K 4.6 3.8 3.4* 3.6  CL 108 109 107 106  CO2 18* 21* 22 26  GLUCOSE 183* 72 78 118*  BUN 28* 26* 21 14  CREATININE 1.23* 1.29* 0.92 0.92  CALCIUM 8.7* 8.7* 8.5* 8.4*  MG 2.0  --  1.8  --    Liver Function Tests: Recent Labs  Lab 10/25/20 1547 10/26/20 0545 10/27/20 0738 10/29/20 0643  AST 32 30 25 28   ALT 24 22 20 20   ALKPHOS 97 94 84 89  BILITOT 1.3* 1.0 0.9 1.0  PROT 6.2* 5.9* 5.5* 5.6*  ALBUMIN 2.9* 2.7* 2.6* 2.6*   Recent Labs  Lab 10/25/20 1547  LIPASE 44   No results for input(s): AMMONIA in the last 168 hours. CBC: Recent Labs  Lab 10/25/20 1547 10/26/20 0545 10/27/20 0738 10/29/20 0643  WBC 8.3 6.4 4.6 4.9  NEUTROABS  --   --  2.4 2.4  HGB 8.1* 7.6* 7.8* 7.2*  HCT 28.7* 25.4* 26.1* 25.4*  MCV 69.0* 66.0* 66.1* 66.0*  PLT 294 238 219 200   Cardiac Enzymes: No results for input(s): CKTOTAL, CKMB, CKMBINDEX, TROPONINI in the last 168 hours. BNP: Invalid input(s): POCBNP CBG: Recent Labs  Lab 10/29/20 0730 10/29/20 1119 10/29/20 1610 10/29/20 2111 10/30/20 0801  GLUCAP 114* 199* 232* 236* 115*   D-Dimer No results for input(s): DDIMER in the last 72 hours. Hgb A1c No results for input(s): HGBA1C in the last 72 hours. Lipid Profile No results for input(s): CHOL, HDL, LDLCALC, TRIG, CHOLHDL, LDLDIRECT in the last 72 hours. Thyroid function studies No results for input(s): TSH, T4TOTAL, T3FREE, THYROIDAB in the last 72 hours.  Invalid input(s): FREET3 Anemia work up No results for input(s): VITAMINB12, FOLATE, FERRITIN, TIBC, IRON,  RETICCTPCT in the last 72 hours. Urinalysis    Component Value Date/Time   COLORURINE YELLOW (A) 10/25/2020 2026   APPEARANCEUR HAZY (A) 10/25/2020 2026   LABSPEC 1.017 10/25/2020 2026   PHURINE 5.0 10/25/2020 2026   GLUCOSEU NEGATIVE 10/25/2020 2026   HGBUR SMALL (A) 10/25/2020 2026   BILIRUBINUR NEGATIVE 10/25/2020 2026   KETONESUR NEGATIVE 10/25/2020 2026   PROTEINUR NEGATIVE  10/25/2020 2026   NITRITE POSITIVE (A) 10/25/2020 2026   LEUKOCYTESUR SMALL (A) 10/25/2020 2026   Sepsis Labs Invalid input(s): PROCALCITONIN,  WBC,  LACTICIDVEN Microbiology Recent Results (from the past 240 hour(s))  Urine Culture     Status: Abnormal   Collection Time: 10/25/20  8:26 PM   Specimen: Urine, Random  Result Value Ref Range Status   Specimen Description   Final    URINE, RANDOM Performed at [facility], [address] [place], [place] [address]    Special Requests   Final    NONE Performed at [facility], [address] [place], [place] [address]    Culture >=100,000 COLONIES/mL KLEBSIELLA PNEUMONIAE (A)  Final   Report Status 10/28/2020 FINAL  Final   Organism ID, Bacteria KLEBSIELLA PNEUMONIAE (A)  Final      Susceptibility   Klebsiella pneumoniae - MIC*    AMPICILLIN RESISTANT Resistant     CEFAZOLIN <=4 SENSITIVE Sensitive     CEFEPIME <=0.12 SENSITIVE Sensitive     CEFTRIAXONE <=0.25 SENSITIVE Sensitive     CIPROFLOXACIN <=0.25 SENSITIVE Sensitive     GENTAMICIN <=1 SENSITIVE Sensitive     IMIPENEM <=0.25 SENSITIVE Sensitive     NITROFURANTOIN 32 SENSITIVE Sensitive     TRIMETH/SULFA <=20 SENSITIVE Sensitive     AMPICILLIN/SULBACTAM <=2 SENSITIVE Sensitive     PIP/TAZO <=4 SENSITIVE Sensitive     * >=100,000 COLONIES/mL KLEBSIELLA PNEUMONIAE  Aerobic/Anaerobic Culture w Gram Stain (surgical/deep wound)     Status: None (Preliminary result)   Collection Time: 10/26/20 12:20 PM   Specimen: PATH Cytology Peritoneal fluid  Result Value Ref Range  Status   Specimen Description   Final    PERITONEAL Performed at [facility], [address] [place], [place] [address]    Special Requests   Final    PERITONEAL Performed at [facility], [address] [place], [place] [address]    Gram Stain NO WBC SEEN NO ORGANISMS SEEN   Final   Culture   Final    NO GROWTH 2 DAYS NO ANAEROBES ISOLATED; CULTURE IN PROGRESS FOR 5 DAYS Performed at [place] [address] [place], [place] [address]    Report Status PENDING  Incomplete     Time coordinating discharge: Over 30 minutes  SIGNED:   Sidney Ace, MD  [facility] 10/30/2020, 12:50 PM Pager   If 7PM-7AM, please contact night-coverage

## 2020-10-31 ENCOUNTER — Encounter (HOSPITAL_COMMUNITY): Payer: 59

## 2020-10-31 LAB — AEROBIC/ANAEROBIC CULTURE W GRAM STAIN (SURGICAL/DEEP WOUND)
Culture: NO GROWTH
Gram Stain: NONE SEEN

## 2020-11-01 LAB — LIPASE, FLUID: Lipase-Fluid: 8 U/L

## 2020-11-03 LAB — ACID FAST SMEAR (AFB, MYCOBACTERIA): Acid Fast Smear: NEGATIVE

## 2020-11-05 ENCOUNTER — Encounter (HOSPITAL_COMMUNITY): Payer: 59

## 2020-11-07 ENCOUNTER — Other Ambulatory Visit: Payer: Self-pay | Admitting: Gastroenterology

## 2020-11-07 ENCOUNTER — Encounter (HOSPITAL_COMMUNITY): Payer: 59

## 2020-11-07 DIAGNOSIS — K7460 Unspecified cirrhosis of liver: Secondary | ICD-10-CM

## 2020-11-16 ENCOUNTER — Other Ambulatory Visit: Payer: Self-pay

## 2020-11-16 ENCOUNTER — Ambulatory Visit
Admission: RE | Admit: 2020-11-16 | Discharge: 2020-11-16 | Disposition: A | Discharge status: Home / Self Care | Payer: Managed Care, Other (non HMO) | Source: Ambulatory Visit | Attending: Gastroenterology | Admitting: Gastroenterology

## 2020-11-16 DIAGNOSIS — K7460 Unspecified cirrhosis of liver: Secondary | ICD-10-CM

## 2020-11-16 LAB — ALBUMIN, PLEURAL OR PERITONEAL FLUID: Albumin, Fluid: <1 g/dL

## 2020-11-16 LAB — PROTEIN, PLEURAL OR PERITONEAL FLUID: Total protein, fluid: <3 g/dL

## 2020-11-16 LAB — BODY FLUID CELL COUNT WITH DIFFERENTIAL
Eos, Fluid: 0 %
Lymphs, Fluid: 36 %
Monocyte-Macrophage-Serous Fluid: 48 %
Neutrophil Count, Fluid: 16 %
Total Nucleated Cell Count, Fluid: 338 cu mm

## 2020-11-16 NOTE — Procedures (Signed)
PROCEDURE SUMMARY:  Successful US guided paracentesis from left abdomen.  Yielded 6.7 L of clear yellow fluid.  No immediate complications.  Pt tolerated well.   Specimen sent for labs.  EBL < 2 mL  Theresa Duty, NP 11/16/2020 3:26 PM

## 2020-11-18 LAB — PROTEIN, BODY FLUID (OTHER): Total Protein, Body Fluid Other: 1.5 g/dL

## 2020-11-19 ENCOUNTER — Encounter: Payer: Self-pay | Admitting: *Deleted

## 2020-11-20 ENCOUNTER — Ambulatory Visit: Payer: Managed Care, Other (non HMO) | Admitting: Anesthesiology

## 2020-11-20 ENCOUNTER — Encounter
Admission: RE | Disposition: A | Discharge status: Home / Self Care | Payer: Self-pay | Source: Home / Self Care | Attending: Gastroenterology

## 2020-11-20 ENCOUNTER — Other Ambulatory Visit: Payer: Self-pay

## 2020-11-20 ENCOUNTER — Encounter: Payer: Self-pay | Admitting: *Deleted

## 2020-11-20 ENCOUNTER — Ambulatory Visit
Admission: RE | Admit: 2020-11-20 | Discharge: 2020-11-20 | Disposition: A | Discharge status: Home / Self Care | Payer: Managed Care, Other (non HMO) | Attending: Gastroenterology | Admitting: Gastroenterology

## 2020-11-20 DIAGNOSIS — K642 Third degree hemorrhoids: Secondary | ICD-10-CM | POA: Insufficient documentation

## 2020-11-20 DIAGNOSIS — Z90710 Acquired absence of both cervix and uterus: Secondary | ICD-10-CM | POA: Diagnosis not present

## 2020-11-20 DIAGNOSIS — K7460 Unspecified cirrhosis of liver: Secondary | ICD-10-CM | POA: Diagnosis not present

## 2020-11-20 DIAGNOSIS — Z79899 Other long term (current) drug therapy: Secondary | ICD-10-CM | POA: Diagnosis not present

## 2020-11-20 DIAGNOSIS — Z7984 Long term (current) use of oral hypoglycemic drugs: Secondary | ICD-10-CM | POA: Insufficient documentation

## 2020-11-20 DIAGNOSIS — D120 Benign neoplasm of cecum: Secondary | ICD-10-CM | POA: Insufficient documentation

## 2020-11-20 DIAGNOSIS — Z91040 Latex allergy status: Secondary | ICD-10-CM | POA: Insufficient documentation

## 2020-11-20 DIAGNOSIS — K219 Gastro-esophageal reflux disease without esophagitis: Secondary | ICD-10-CM | POA: Insufficient documentation

## 2020-11-20 DIAGNOSIS — K7581 Nonalcoholic steatohepatitis (NASH): Secondary | ICD-10-CM | POA: Insufficient documentation

## 2020-11-20 DIAGNOSIS — I10 Essential (primary) hypertension: Secondary | ICD-10-CM | POA: Insufficient documentation

## 2020-11-20 DIAGNOSIS — D563 Thalassemia minor: Secondary | ICD-10-CM | POA: Insufficient documentation

## 2020-11-20 DIAGNOSIS — Z1211 Encounter for screening for malignant neoplasm of colon: Secondary | ICD-10-CM | POA: Insufficient documentation

## 2020-11-20 DIAGNOSIS — G4730 Sleep apnea, unspecified: Secondary | ICD-10-CM | POA: Insufficient documentation

## 2020-11-20 DIAGNOSIS — Z8619 Personal history of other infectious and parasitic diseases: Secondary | ICD-10-CM | POA: Diagnosis not present

## 2020-11-20 DIAGNOSIS — E1140 Type 2 diabetes mellitus with diabetic neuropathy, unspecified: Secondary | ICD-10-CM | POA: Insufficient documentation

## 2020-11-20 HISTORY — PX: COLONOSCOPY WITH PROPOFOL: SHX5780

## 2020-11-20 LAB — CBC WITH DIFFERENTIAL/PLATELET
Abs Immature Granulocytes: 0.03 10*3/uL (ref 0.00–0.07)
Basophils Absolute: 0 10*3/uL (ref 0.0–0.1)
Basophils Relative: 0 %
Eosinophils Absolute: 0.1 10*3/uL (ref 0.0–0.5)
Eosinophils Relative: 2 %
HCT: 30.3 % — ABNORMAL LOW (ref 36.0–46.0)
Hemoglobin: 8.8 g/dL — ABNORMAL LOW (ref 12.0–15.0)
Immature Granulocytes: 0 %
Lymphocytes Relative: 21 %
Lymphs Abs: 1.8 10*3/uL (ref 0.7–4.0)
MCH: 17.3 pg — ABNORMAL LOW (ref 26.0–34.0)
MCHC: 29 g/dL — ABNORMAL LOW (ref 30.0–36.0)
MCV: 59.5 fL — ABNORMAL LOW (ref 80.0–100.0)
Monocytes Absolute: 0.7 10*3/uL (ref 0.1–1.0)
Monocytes Relative: 9 %
Neutro Abs: 5.6 10*3/uL (ref 1.7–7.7)
Neutrophils Relative %: 68 %
Platelets: 319 10*3/uL (ref 150–400)
RBC: 5.09 MIL/uL (ref 3.87–5.11)
RDW: 25.8 % — ABNORMAL HIGH (ref 11.5–15.5)
Smear Review: NORMAL
WBC: 8.3 10*3/uL (ref 4.0–10.5)
nRBC: 0 % (ref 0.0–0.2)

## 2020-11-20 LAB — BASIC METABOLIC PANEL
Anion gap: 9 (ref 5–15)
BUN: 27 mg/dL — ABNORMAL HIGH (ref 8–23)
CO2: 27 mmol/L (ref 22–32)
Calcium: 9.1 mg/dL (ref 8.9–10.3)
Chloride: 100 mmol/L (ref 98–111)
Creatinine, Ser: 1.5 mg/dL — ABNORMAL HIGH (ref 0.44–1.00)
GFR, Estimated: 39 mL/min — ABNORMAL LOW (ref >60)
Glucose, Bld: 153 mg/dL — ABNORMAL HIGH (ref 70–99)
Potassium: 4.4 mmol/L (ref 3.5–5.1)
Sodium: 136 mmol/L (ref 135–145)

## 2020-11-20 LAB — CYTOLOGY - NON PAP

## 2020-11-20 LAB — GLUCOSE, CAPILLARY: Glucose-Capillary: 138 mg/dL — ABNORMAL HIGH (ref 70–99)

## 2020-11-20 SURGERY — COLONOSCOPY WITH PROPOFOL
Anesthesia: General

## 2020-11-20 MED ORDER — LIDOCAINE HCL (CARDIAC) PF 100 MG/5ML IV SOSY
PREFILLED_SYRINGE | INTRAVENOUS | Status: DC | PRN
  Administered 2020-11-20: 30 mg via INTRAVENOUS

## 2020-11-20 MED ORDER — PROPOFOL 500 MG/50ML IV EMUL
INTRAVENOUS | Status: AC
  Filled 2020-11-20: qty 50

## 2020-11-20 MED ORDER — SODIUM CHLORIDE 0.9 % IV SOLN: INTRAVENOUS | Status: DC | PRN

## 2020-11-20 MED ORDER — PROPOFOL 10 MG/ML IV BOLUS
INTRAVENOUS | Status: DC | PRN
  Administered 2020-11-20: 50 mg via INTRAVENOUS
  Administered 2020-11-20 (×7): 20 mg via INTRAVENOUS
  Administered 2020-11-20: 50 mg via INTRAVENOUS
  Administered 2020-11-20 (×2): 20 mg via INTRAVENOUS
  Administered 2020-11-20: 30 mg via INTRAVENOUS

## 2020-11-20 MED ORDER — SODIUM CHLORIDE 0.9 % IV SOLN: INTRAVENOUS | Status: DC

## 2020-11-20 NOTE — Transfer of Care (Signed)
Immediate Anesthesia Transfer of Care Note  Patient: Rachel Huber  Procedure(s) Performed: COLONOSCOPY WITH PROPOFOL (N/A )  Patient Location: PACU and Endoscopy Unit  Anesthesia Type:MAC and General  Level of Consciousness: awake, alert  and oriented  Airway & Oxygen Therapy: Patient Spontanous Breathing  Post-op Assessment: Report given to RN and Post -op Vital signs reviewed and stable  Post vital signs: Reviewed and stable  Last Vitals:  Vitals Value Taken Time  BP 125/68 11/20/20 1023  Temp    Pulse 93 11/20/20 1023  Resp 20 11/20/20 1023  SpO2 98 % 11/20/20 1023  Vitals shown include unvalidated device data.  Last Pain:  Vitals:   11/20/20 0913  TempSrc: Temporal  PainSc: 0-No pain         Complications: No complications documented.

## 2020-11-20 NOTE — Interval H&P Note (Signed)
History and Physical Interval Note:  11/20/2020 9:11 AM  Rachel Huber  has presented today for surgery, with the diagnosis of CIRRHOSIS.  The various methods of treatment have been discussed with the patient and family. After consideration of risks, benefits and other options for treatment, the patient has consented to  Procedure(s) with comments: COLONOSCOPY WITH PROPOFOL (N/A) - DM STAT CBC, BMP COVID POSITIVE 09/02/2020 as a surgical intervention.  The patient's history has been reviewed, patient examined, no change in status, stable for surgery.  I have reviewed the patient's chart and labs.  Questions were answered to the patient's satisfaction.     Lesly Rubenstein  Ok to proceed with colonoscopy

## 2020-11-20 NOTE — H&P (Signed)
Outpatient short stay form Pre-procedure 11/20/2020 9:06 AM Rachel Miyamoto MD, MPH  Primary Physician:  Dr. 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 History of hysterectomy. No family of GI malignancies. No blood thinners.    Current Facility-Administered Medications:  .  0.9 %  sodium chloride infusion, , Intravenous, Continuous, Kaeden Mester, Hilton Cork, MD  Medications Prior to Admission  Medication Sig Dispense Refill Last Dose  . acetaminophen (TYLENOL) 650 MG CR tablet Take 1,300 mg by mouth daily as needed for pain.     Marland Kitchen atorvastatin (LIPITOR) 40 MG tablet Take 40 mg by mouth daily.     . collagenase (SANTYL) ointment Apply 1 application topically daily.     . diphenhydrAMINE (BENADRYL) 25 MG tablet Take 25 mg by mouth at bedtime.     . Ferrous Sulfate (IRON PO) Take 1 tablet by mouth daily.     . furosemide (LASIX) 40 MG tablet Take 1 tablet (40 mg total) by mouth daily. 30 tablet 0   . gabapentin (NEURONTIN) 400 MG capsule Take 800 mg by mouth 2 (two) times daily.     Marland Kitchen glimepiride (AMARYL) 2 MG tablet Take 2 mg by mouth daily.  11   . guaiFENesin-dextromethorphan (ROBITUSSIN DM) 100-10 MG/5ML syrup Take 5 mLs by mouth every 4 (four) hours as needed for cough. 236 mL 0   . hydrocortisone (ANUSOL-HC) 25 MG suppository Place 1 suppository (25 mg total) rectally 2 (two) times daily. 20 suppository 1   . melatonin 5 MG TABS Take 5 mg by mouth at bedtime as needed (sleep).     . metFORMIN (GLUCOPHAGE-XR) 500 MG 24 hr tablet Take 1,000 mg by mouth every evening.  11   . methocarbamol (ROBAXIN) 500 MG tablet Take 1 tablet (500 mg total) by mouth every 8 (eight) hours as needed for muscle spasms. 30 tablet 0   . Multiple Vitamins-Minerals (MULTIVITAMIN WITH MINERALS) tablet Take 1 tablet by mouth daily.     Marland Kitchen oxyCODONE-acetaminophen  (PERCOCET/ROXICET) 5-325 MG tablet Take 1 tablet by mouth every 6 (six) hours as needed for severe pain. (Patient not taking: Reported on 10/25/2020) 14 tablet 0   . oxymetazoline (AFRIN) 0.05 % nasal spray Place 1 spray into both nostrils 2 (two) times daily.     . pantoprazole (PROTONIX) 40 MG tablet Take 1 tablet (40 mg total) by mouth daily. 30 tablet 1   . polyethylene glycol (MIRALAX) 17 g packet Take 17 g by mouth daily. 30 each 0   . sertraline (ZOLOFT) 50 MG tablet Take 50 mg by mouth daily.     Marland Kitchen spironolactone (ALDACTONE) 25 MG tablet Take 1 tablet (25 mg total) by mouth daily. 30 tablet 0   . TRULICITY 4.5 CV/8.9FY SOPN Inject 4.5 mg into the skin every Wednesday.        Allergies  Allergen Reactions  . Latex Rash    Contact rash     Past Medical History:  Diagnosis Date  . Anxiety   . Arthritis   . Diabetes mellitus without complication ([place])   . Dyspnea    DOE  . GERD (gastroesophageal reflux disease)   . Hepatitis    PAST  . Hypertension   . Neuropathy   . Neuropathy, diabetic ([place])   . Pneumonia   . Sinus complaint   . Sleep apnea  CPAP  . Thalassemia minor   . Thalassemia minor     Review of systems:  Otherwise negative.    Physical Exam  Gen: Alert, oriented. Appears stated age.  HEENT: PERRLA. Lungs: No respiratory distress CV: RRR Abd: soft, benign, no masses Ext: No edema    Planned procedures: Proceed with colonoscopy. The patient understands the nature of the planned procedure, indications, risks, alternatives and potential complications including but not limited to bleeding, infection, perforation, damage to internal organs and possible oversedation/side effects from anesthesia. The patient agrees and gives consent to proceed.  Please refer to procedure notes for findings, recommendations and patient disposition/instructions.     Rachel Miyamoto MD, MPH Gastroenterology 11/20/2020  9:06 AM

## 2020-11-20 NOTE — Anesthesia Preprocedure Evaluation (Signed)
Anesthesia Evaluation  Patient identified by MRN, date of birth, ID band Patient awake    Reviewed: Allergy & Precautions, NPO status , Patient's Chart, lab work & pertinent test results  History of Anesthesia Complications Negative for: history of anesthetic complications  Airway Mallampati: IV  TM Distance: >3 FB Neck ROM: Full    Dental  (+) Poor Dentition   Pulmonary sleep apnea and Continuous Positive Airway Pressure Ventilation , neg COPD,    breath sounds clear to auscultation- rhonchi (-) wheezing      Cardiovascular hypertension, Pt. on medications (-) CAD, (-) Past MI, (-) Cardiac Stents and (-) CABG  Rhythm:Regular Rate:Normal - Systolic murmurs and - Diastolic murmurs    Neuro/Psych neg Seizures PSYCHIATRIC DISORDERS Anxiety Depression    GI/Hepatic GERD  ,(+) Hepatitis -  Endo/Other  diabetes  Renal/GU CRFRenal disease     Musculoskeletal  (+) Arthritis ,   Abdominal (+) + obese,   Peds  Hematology  (+) anemia ,   Anesthesia Other Findings Past Medical History: No date: Anxiety No date: Arthritis No date: Diabetes mellitus without complication ([facility]) No date: Dyspnea     Comment:  DOE No date: GERD (gastroesophageal reflux disease) No date: Hepatitis     Comment:  PAST No date: Hypertension No date: Neuropathy No date: Neuropathy, diabetic ([place]) No date: Pneumonia No date: Sinus complaint No date: Sleep apnea     Comment:  CPAP No date: Thalassemia minor No date: Thalassemia minor   Reproductive/Obstetrics                             Anesthesia Physical Anesthesia Plan  ASA: III  Anesthesia Plan: General   Post-op Pain Management:    Induction: Intravenous  PONV Risk Score and Plan: 2 and Propofol infusion  Airway Management Planned: Natural Airway  Additional Equipment:   Intra-op Plan:   Post-operative Plan:   Informed Consent: I have reviewed  the patients History and Physical, chart, labs and discussed the procedure including the risks, benefits and alternatives for the proposed anesthesia with the patient or authorized representative who has indicated his/her understanding and acceptance.     Dental advisory given  Plan Discussed with: CRNA and Anesthesiologist  Anesthesia Plan Comments:         Anesthesia Quick Evaluation

## 2020-11-20 NOTE — Op Note (Signed)
[facility] Gastroenterology Patient Name: Rachel Huber Procedure Date: 11/20/2020 9:45 AM MRN: [id number] Account #: [id number] Date of Birth: 1956-05-01 Admit Type: Outpatient Age: 65 Room: [facility] ENDO ROOM 1 Gender: Female Note Status: Finalized Procedure:             Colonoscopy Indications:           High risk colon cancer surveillance: Personal history                         of non-advanced adenoma, Last colonoscopy within the                         past few months Providers:             Andrey Farmer MD, MD Referring MD:          Ocie Cornfield. Ouida Sills MD, MD (Referring MD) Medicines:             Monitored Anesthesia Care Complications:         No immediate complications. Estimated blood loss:                         Minimal. Procedure:             Pre-Anesthesia Assessment:                        - Prior to the procedure, a History and Physical was                         performed, and patient medications and allergies were                         reviewed. The patient is competent. The risks and                         benefits of the procedure and the sedation options and                         risks were discussed with the patient. All questions                         were answered and informed consent was obtained.                         Patient identification and proposed procedure were                         verified by the physician, the nurse, the anesthetist                         and the technician in the endoscopy suite. Mental                         Status Examination: alert and oriented. Airway                         Examination: normal oropharyngeal airway and neck  mobility. Respiratory Examination: clear to                         auscultation. CV Examination: normal. Prophylactic                         Antibiotics: The patient does not require prophylactic                         antibiotics. Prior  Anticoagulants: The patient has                         taken no previous anticoagulant or antiplatelet                         agents. ASA Grade Assessment: III - A patient with                         severe systemic disease. After reviewing the risks and                         benefits, the patient was deemed in satisfactory                         condition to undergo the procedure. The anesthesia                         plan was to use monitored anesthesia care (MAC).                         Immediately prior to administration of medications,                         the patient was re-assessed for adequacy to receive                         sedatives. The heart rate, respiratory rate, oxygen                         saturations, blood pressure, adequacy of pulmonary                         ventilation, and response to care were monitored                         throughout the procedure. The physical status of the                         patient was re-assessed after the procedure.                        After obtaining informed consent, the colonoscope was                         passed under direct vision. Throughout the procedure,                         the patient's blood pressure, pulse, and oxygen  saturations were monitored continuously. The                         Colonoscope was introduced through the anus and                         advanced to the the cecum, identified by appendiceal                         orifice and ileocecal valve. The colonoscopy was                         somewhat difficult due to restricted mobility of the                         colon. The patient tolerated the procedure well. The                         quality of the bowel preparation was inadequate. Findings:      The perianal and digital rectal examinations were normal.      Semi-liquid stool was found in the entire colon, making visualization       difficult.      A 2  mm polyp was found in the cecum. The polyp was sessile. The polyp       was removed with a cold snare. Resection and retrieval were complete.       Estimated blood loss was minimal.      Internal hemorrhoids were found during retroflexion. The hemorrhoids       were Grade III (internal hemorrhoids that prolapse but require manual       reduction).      The exam was otherwise without abnormality on direct and retroflexion       views. Impression:            - Preparation of the colon was inadequate.                        - Stool in the entire examined colon.                        - One 2 mm polyp in the cecum, removed with a cold                         snare. Resected and retrieved.                        - Internal hemorrhoids.                        - The examination was otherwise normal on direct and                         retroflexion views. Recommendation:        - Discharge patient to home.                        - Resume previous diet.                        - Continue present medications.                        -  Await pathology results.                        - Repeat colonoscopy in 6-12 months because the bowel                         preparation was suboptimal. No large masses were seen.                        - Return to referring physician as previously                         scheduled. Procedure Code(s):     --- Professional ---                        [phone], Colonoscopy, flexible; with removal of                         tumor(s), polyp(s), or other lesion(s) by snare                         technique Diagnosis Code(s):     --- Professional ---                        Z86.010, Personal history of colonic polyps                        K64.2, Third degree hemorrhoids                        K63.5, Polyp of colon CPT copyright 2019 [employer]. All rights reserved. The codes documented in this report are preliminary and upon coder review may  be revised  to meet current compliance requirements. Andrey Farmer MD, MD 11/20/2020 10:25:29 AM Number of Addenda: 0 Note Initiated On: 11/20/2020 9:45 AM Scope Withdrawal Time: 0 hours 12 minutes 27 seconds  Total Procedure Duration: 0 hours 25 minutes 13 seconds  Estimated Blood Loss:  Estimated blood loss was minimal.      [facility]

## 2020-11-20 NOTE — Anesthesia Postprocedure Evaluation (Signed)
Anesthesia Post Note  Patient: Rachel Huber  Procedure(s) Performed: COLONOSCOPY WITH PROPOFOL (N/A )  Patient location during evaluation: Endoscopy Anesthesia Type: General Level of consciousness: awake and alert and oriented Pain management: pain level controlled Vital Signs Assessment: post-procedure vital signs reviewed and stable Respiratory status: spontaneous breathing, nonlabored ventilation and respiratory function stable Cardiovascular status: blood pressure returned to baseline and stable Postop Assessment: no signs of nausea or vomiting Anesthetic complications: no   No complications documented.   Last Vitals:  Vitals:   11/20/20 1043 11/20/20 1053  BP: 133/70 (!) 150/66  Pulse: 85 92  Resp: 17 15  Temp:    SpO2: 97% 98%    Last Pain:  Vitals:   11/20/20 1053  TempSrc:   PainSc: 0-No pain                 Kervin Bones

## 2020-11-21 LAB — SURGICAL PATHOLOGY

## 2020-11-21 LAB — AEROBIC/ANAEROBIC CULTURE W GRAM STAIN (SURGICAL/DEEP WOUND)
Culture: NO GROWTH
Gram Stain: NONE SEEN

## 2020-12-05 LAB — FUNGUS CULTURE RESULT

## 2020-12-05 LAB — FUNGAL ORGANISM REFLEX

## 2020-12-05 LAB — FUNGUS CULTURE WITH STAIN

## 2020-12-07 ENCOUNTER — Ambulatory Visit
Admission: RE | Admit: 2020-12-07 | Discharge: 2020-12-07 | Disposition: A | Discharge status: Home / Self Care | Payer: Managed Care, Other (non HMO) | Source: Ambulatory Visit | Attending: Obstetrics & Gynecology | Admitting: Obstetrics & Gynecology

## 2020-12-07 ENCOUNTER — Other Ambulatory Visit: Payer: Self-pay

## 2020-12-07 DIAGNOSIS — N6310 Unspecified lump in the right breast, unspecified quadrant: Secondary | ICD-10-CM | POA: Insufficient documentation

## 2020-12-12 ENCOUNTER — Other Ambulatory Visit: Payer: Self-pay | Admitting: Gastroenterology

## 2020-12-12 ENCOUNTER — Other Ambulatory Visit: Payer: Self-pay | Admitting: Internal Medicine

## 2020-12-12 DIAGNOSIS — K7460 Unspecified cirrhosis of liver: Secondary | ICD-10-CM

## 2020-12-14 ENCOUNTER — Other Ambulatory Visit: Payer: Self-pay

## 2020-12-14 ENCOUNTER — Ambulatory Visit
Admission: RE | Admit: 2020-12-14 | Discharge: 2020-12-14 | Disposition: A | Discharge status: Home / Self Care | Payer: Managed Care, Other (non HMO) | Source: Ambulatory Visit | Attending: Gastroenterology | Admitting: Gastroenterology

## 2020-12-14 DIAGNOSIS — K7460 Unspecified cirrhosis of liver: Secondary | ICD-10-CM

## 2020-12-14 LAB — ALBUMIN, PLEURAL OR PERITONEAL FLUID: Albumin, Fluid: <1 g/dL

## 2020-12-14 LAB — BODY FLUID CELL COUNT WITH DIFFERENTIAL
Eos, Fluid: 0 %
Lymphs, Fluid: 39 %
Monocyte-Macrophage-Serous Fluid: 50 %
Neutrophil Count, Fluid: 11 %
Total Nucleated Cell Count, Fluid: 156 cu mm

## 2020-12-14 LAB — PROTEIN, PLEURAL OR PERITONEAL FLUID: Total protein, fluid: <3 g/dL

## 2020-12-14 MED ORDER — ALBUMIN HUMAN 25 % IV SOLN: 25.0000 g | Freq: Once | INTRAVENOUS | Status: AC

## 2020-12-14 MED ORDER — ALBUMIN HUMAN 25 % IV SOLN: 25.0000 g | Freq: Once | INTRAVENOUS | Status: DC

## 2020-12-14 MED ORDER — ALBUMIN HUMAN 25 % IV SOLN
INTRAVENOUS | Status: AC
  Administered 2020-12-14: 25 g via INTRAVENOUS
  Filled 2020-12-14: qty 100

## 2020-12-14 NOTE — Procedures (Addendum)
Ultrasound-guided diagnostic and therapeutic paracentesis performed yielding 7.4 liters of straw colored fluid.  No immediate complications. EBL is none.

## 2020-12-15 LAB — ACID FAST CULTURE WITH REFLEXED SENSITIVITIES (MYCOBACTERIA): Acid Fast Culture: NEGATIVE

## 2020-12-15 LAB — PROTEIN, BODY FLUID (OTHER): Total Protein, Body Fluid Other: 1.7 g/dL

## 2020-12-18 LAB — CYTOLOGY - NON PAP

## 2021-01-31 ENCOUNTER — Encounter (HOSPITAL_COMMUNITY): Payer: Self-pay

## 2021-01-31 ENCOUNTER — Other Ambulatory Visit: Payer: Self-pay

## 2021-01-31 NOTE — Progress Notes (Shared)
[place], [place] [address]   CLINIC:  Medical Oncology/Hematology  Patient Care Team: [name], MD as PCP - General (Internal Medicine)  CHIEF COMPLAINTS/PURPOSE OF CONSULTATION:  Evaluation of other IDA  HISTORY OF PRESENTING ILLNESS:  Rachel Huber 65 y.o. female is here because of an evaluation of other IDA, at the request of Dr. Ouida Sills.  Her last colonoscopy was 10/10/20   MEDICAL HISTORY:  Past Medical History:  Diagnosis Date   Anxiety    Arthritis    Cirrhosis of liver ([facility])    Diabetes mellitus without complication (HCC)    Dyspnea    DOE   GERD (gastroesophageal reflux disease)    Hepatitis    PAST   Hypertension    Neuropathy    Neuropathy, diabetic (HCC)    Pneumonia    Sinus complaint    Sleep apnea    CPAP   Thalassemia minor    Thalassemia minor     SURGICAL HISTORY: Past Surgical History:  Procedure Laterality Date   ABDOMINAL HYSTERECTOMY     BREAST BIOPSY Right 02/15/2018   [place] bx 6-6:30 ribbon shape, ONE CORE FRAGMENT WITH FIBROSIS. ONE CORE FRAGMENT WITH PORTION OF A DILATED   BREAST BIOPSY Right 02/15/2018   [place] bx 9:00 heart shape, USUAL DUCTAL HYPERPLASIA   BREAST LUMPECTOMY Right 03/09/2018   Procedure: BREAST LUMPECTOMY x 2;  Surgeon: Benjamine Sprague, DO;  Location: [facility] ORS;  Service: General;  Laterality: Right;   CATARACT EXTRACTION W/PHACO Left 06/11/2016   Procedure: CATARACT EXTRACTION PHACO AND INTRAOCULAR LENS PLACEMENT (IOC);  Surgeon: Estill Cotta, MD;  Location: [facility] OR[facility];  Service: Ophthalmology;  Laterality: Left;  Lot # [id number] H US:01:38.6 AP%:26.4 CDE:44.15   CATARACT EXTRACTION W/PHACO Right 06/15/2018   Procedure: CATARACT EXTRACTION PHACO AND INTRAOCULAR LENS PLACEMENT (IOC);  Surgeon: Birder Robson, MD;  Location: [facility];  Service: Ophthalmology;  Laterality: Right;  [place] 00:38.2 CDE 4.23 Fluid Pack Lot # [id number]   COLONOSCOPIES     COLONOSCOPY WITH  PROPOFOL N/A 10/10/2020   Procedure: COLONOSCOPY WITH PROPOFOL;  Surgeon: Lesly Rubenstein, MD;  Location: [facility];  Service: Endoscopy;  Laterality: N/A;   COLONOSCOPY WITH PROPOFOL N/A 11/20/2020   Procedure: COLONOSCOPY WITH PROPOFOL;  Surgeon: Lesly Rubenstein, MD;  Location: [facility] ENDOSCOPY;  Service: Endoscopy;  Laterality: N/A;  DM STAT CBC, BMP COVID POSITIVE 09/02/2020   CTR     ESOPHAGOGASTRODUODENOSCOPY (EGD) WITH PROPOFOL N/A 10/10/2020   Procedure: ESOPHAGOGASTRODUODENOSCOPY (EGD) WITH PROPOFOL;  Surgeon: Lesly Rubenstein, MD;  Location: [facility] ENDOSCOPY;  Service: Endoscopy;  Laterality: N/A;  COVID POSITIVE 10/08/2020   JOINT REPLACEMENT     KNEE SURGERY Right 09/24/2017   plates and pins   TOTAL SHOULDER ARTHROPLASTY Right 09/27/2015    SOCIAL HISTORY: Social History   Socioeconomic History   Marital status: Married    Spouse name: Not on file   Number of children: 0   Years of education: Not on file   Highest education level: Not on file  Occupational History   Occupation: EMPLOYED  Tobacco Use   Smoking status: Never   Smokeless tobacco: Never  Vaping Use   Vaping Use: Never used  Substance and Sexual Activity   Alcohol use: No   Drug use: Never   Sexual activity: Not Currently  Other Topics Concern   Not on file  Social History Narrative   Not on file   Social Determinants of Health  Financial Resource Strain: Low Risk    Difficulty of Paying Living Expenses: Not hard at all  Food Insecurity: No Food Insecurity   Worried About Charity fundraiser in the Last Year: Never true   Ran Out of Food in the Last Year: Never true  Transportation Needs: No Transportation Needs   Lack of Transportation (Medical): No   Lack of Transportation (Non-Medical): No  Physical Activity: Inactive   Days of Exercise per Week: 0 days   Minutes of Exercise per Session: 0 min  Stress: No Stress Concern Present   Feeling of Stress : Not at all  Social  Connections: Moderately Isolated   Frequency of Communication with Friends and Family: More than three times a week   Frequency of Social Gatherings with Friends and Family: Once a week   Attends Religious Services: Never   Marine scientist or Organizations: No   Attends Music therapist: Never   Marital Status: Married  Human resources officer Violence: Not At Risk   Fear of Current or Ex-Partner: No   Emotionally Abused: No   Physically Abused: No   Sexually Abused: No    FAMILY HISTORY: Family History  Problem Relation Age of Onset   Breast cancer Mother 51   Lymphoma Mother    Diabetes Father    Kidney cancer Father    Heart disease Father    Diabetes Sister    Breast cancer Sister 76    ALLERGIES:  is allergic to latex.  MEDICATIONS:  Current Outpatient Medications  Medication Sig Dispense Refill   acetaminophen (TYLENOL) 650 MG CR tablet Take 1,300 mg by mouth daily as needed for pain.     atorvastatin (LIPITOR) 40 MG tablet Take 40 mg by mouth daily.     collagenase (SANTYL) ointment Apply 1 application topically daily.     diphenhydrAMINE (BENADRYL) 25 MG tablet Take 25 mg by mouth at bedtime. (Patient not taking: Reported on 01/31/2021)     furosemide (LASIX) 40 MG tablet Take 1 tablet (40 mg total) by mouth daily. 30 tablet 0   gabapentin (NEURONTIN) 400 MG capsule Take 800 mg by mouth 2 (two) times daily.     guaiFENesin-dextromethorphan (ROBITUSSIN DM) 100-10 MG/5ML syrup Take 5 mLs by mouth every 4 (four) hours as needed for cough. (Patient not taking: Reported on 01/31/2021) 236 mL 0   hydrocortisone (ANUSOL-HC) 25 MG suppository Place 1 suppository (25 mg total) rectally 2 (two) times daily. (Patient not taking: Reported on 01/31/2021) 20 suppository 1   melatonin 5 MG TABS Take 5 mg by mouth at bedtime as needed (sleep). (Patient not taking: Reported on 01/31/2021)     methocarbamol (ROBAXIN) 500 MG tablet Take 1 tablet (500 mg total) by mouth every 8  (eight) hours as needed for muscle spasms. (Patient not taking: Reported on 01/31/2021) 30 tablet 0   Multiple Vitamins-Minerals (MULTIVITAMIN WITH MINERALS) tablet Take 1 tablet by mouth daily.     oxyCODONE-acetaminophen (PERCOCET/ROXICET) 5-325 MG tablet Take 1 tablet by mouth every 6 (six) hours as needed for severe pain. (Patient not taking: No sig reported) 14 tablet 0   oxymetazoline (AFRIN) 0.05 % nasal spray Place 1 spray into both nostrils 2 (two) times daily. (Patient not taking: Reported on 01/31/2021)     polyethylene glycol (MIRALAX) 17 g packet Take 17 g by mouth daily. (Patient not taking: Reported on 01/31/2021) 30 each 0   sertraline (ZOLOFT) 50 MG tablet Take 50 mg by mouth daily.  spironolactone (ALDACTONE) 25 MG tablet Take 1 tablet (25 mg total) by mouth daily. 30 tablet 0   TRULICITY 4.5 VV/6.1YW SOPN Inject 4.5 mg into the skin every Wednesday.     No current facility-administered medications for this visit.    REVIEW OF SYSTEMS:   Review of Systems  All other systems reviewed and are negative.   PHYSICAL EXAMINATION: ECOG PERFORMANCE STATUS: {CHL ONC ECOG PS:[phone]}  There were no vitals filed for this visit. There were no vitals filed for this visit. Physical Exam Vitals reviewed.  Constitutional:      Appearance: Normal appearance.  Cardiovascular:     Rate and Rhythm: Normal rate and regular rhythm.     Pulses: Normal pulses.     Heart sounds: Normal heart sounds.  Pulmonary:     Effort: Pulmonary effort is normal.     Breath sounds: Normal breath sounds.  Neurological:     General: No focal deficit present.     Mental Status: She is alert and oriented to person, place, and time.  Psychiatric:        Mood and Affect: Mood normal.        Behavior: Behavior normal.     LABORATORY DATA:  I have reviewed the data as listed Recent Results (from the past 2160 hour(s))  Body fluid cell count with differential     Status: Abnormal   Collection  Time: 11/16/20  2:30 PM  Result Value Ref Range   Fluid Type-FCT CYTO PERI    Color, Fluid YELLOW (A) YELLOW   Appearance, Fluid HAZY (A) CLEAR   Total Nucleated Cell Count, Fluid 338 cu mm   Neutrophil Count, Fluid 16 %   Lymphs, Fluid 36 %   Monocyte-Macrophage-Serous Fluid 48 %   Eos, Fluid 0 %    Comment: Performed at [facility], [address] [place], [place] [address]  Albumin, pleural or peritoneal fluid      Status: None   Collection Time: 11/16/20  2:30 PM  Result Value Ref Range   Albumin, Fluid <1.0 g/dL    Comment: (NOTE) No normal range established for this test Results should be evaluated in conjunction with serum values    Fluid Type-FALB CYTO PERI     Comment: Performed at [facility], [address] [place], [place] [address]  Protein, pleural or peritoneal fluid     Status: None   Collection Time: 11/16/20  2:30 PM  Result Value Ref Range   Total protein, fluid <3.0 g/dL    Comment: (NOTE) No normal range established for this test Results should be evaluated in conjunction with serum values    Fluid Type-FTP CYTO PERI     Comment: Performed at [facility], [address] [place], [place] [address]  Aerobic/Anaerobic Culture w Gram Stain (surgical/deep wound)     Status: None   Collection Time: 11/16/20  2:30 PM   Specimen: PATH Cytology Peritoneal fluid  Result Value Ref Range   Specimen Description      PERITONEAL Performed at [facility], [address] [place], [place] [address]    Special Requests      NONE Performed at [facility], [address] [place], [place] [address]    Gram Stain NO WBC SEEN NO ORGANISMS SEEN     Culture      No growth aerobically or anaerobically. Performed at [facility], [place] [address], [place], [place] [address]    Report  Status 11/21/2020 FINAL   Protein, body fluid (other)     Status: None   Collection Time: 11/16/20  2:30 PM  Result Value Ref  Range   Total Protein, Body Fluid Other 1.5 g/dL    Comment: (NOTE)                            _________________________________________                           : BODY FLUID TYPE :     TOTAL PROTEIN     :                           :_________________:_______________________:                           : Amniotic Fluid  :               <0.4    :                           :_________________:_______________________:                           :                 : Nonmalignant: <3.0    :                           : Ascitic Fluid   : Malignant:    >3.0    :                           :_________________:_______________________:                           : Bile, Clear     :               <0.9    :                           :_________________:_______________________:                           : Bile, Yellow    :          0.2 - 0.6    :                           :_________________:_______________________:                           : Lymph           :          2.2 - 6.0    :                           :_________________:_______________________:                            :  Human Milk      :          1.9 - 2.0    :                           :_________________: ______________________:                           : Nasal Secretion :          0.1 - 3.5    :                           :_________________:_______________________:                           : Pancreatic      :          0.0 - 0.1    :                           : Juice           :    (post stimulation) :                           :_________________:_______________________:                           :                 : Transudate:   <0.3    :                           : Pleural Fluid   : Exudate:      >0.3    :                           :_________________:_______________________:                           : Saliva          :          0.1 - 0.2    :                           : (Mixed Glands)  :                       :                            :_________________:_______________________:                           : Synovial Fluid  :               <2.5    :                            :_________________:_______________________:                           :  Tears           :          0.8 - 0.9    :                           :_________________:_______________________:                            Louanne Skye, Karene Fry. Reference Intervals                            for  Adults and Children 2008. Ninth                            Edition (V9.1) [place],                            [place]; [place]: July 2009. The method performance specifications have not been established for this test in body fluid.  The test result should be integrated into the clinical context for interpretation. Performed At: [facility] [place], [place] [id number] Rush Farmer MD HY:[id number]    Source of Sample PERITONEAL     Comment: Performed at [facility], [address] [place], [place] [address]  Cytology - Non PAP;     Status: None   Collection Time: 11/16/20  2:39 PM  Result Value Ref Range   CYTOLOGY - NON GYN      CYTOLOGY - NON PAP CASE: [id number] PATIENT: Kailynne Griffeth Non-Gynecological Cytology Report     Specimen Submitted: A. Peritoneal fluid  Clinical History: None provided      DIAGNOSIS: A. PERITONEAL FLUID; ULTRASOUND-GUIDED PARACENTESIS: - NEGATIVE; NO EVIDENCE OF MALIGNANCY. - MESOTHELIAL CELLS AND MIXED INFLAMMATORY CELLS.   GROSS DESCRIPTION: A. Labeled: Abdominal fluid Received: Fresh Collection time: 2:30 PM on 11/16/2020 Placed into formalin time: Not applicable Volume: Approximately 1100 mL Description of fluid and container in which it is received: Received in a glass 1000 mL evacuated container is yellow cloudy fluid. Cytospin slide(s) received: Yes, 1  Specimen material submitted for: Cell block and ThinPrep  The cell block material is fixed in formalin  for 6 hours prior to processing.  RB 11/19/2020  Final Diagnosis performed by Betsy Pries, MD.   Electronically signed 11/20/2020 2:33:16PM The electronic signature indicates that the  named Attending Pathologist has evaluated the specimen Technical component performed at [facility], [address], [place], [place] [address] Lab: [phone] Dir: Rush Farmer, MD, MMM  Professional component performed at [facility], [facility], [place], [place], [place] [address] Lab: [phone] Dir: Dellia Nims. Rubinas, MD   CBC with Differential/Platelet     Status: Abnormal   Collection Time: 11/20/20  8:42 AM  Result Value Ref Range   WBC 8.3 4.0 - 10.5 K/uL   RBC 5.09 3.87 - 5.11 MIL/uL   Hemoglobin 8.8 (L) 12.0 - 15.0 g/dL    Comment: Reticulocyte Hemoglobin testing may be clinically indicated, consider ordering this additional test [id number]    HCT 30.3 (L) 36.0 - 46.0 %   MCV 59.5 (L) 80.0 - 100.0 fL   MCH 17.3 (L) 26.0 - 34.0 pg   MCHC 29.0 (L) 30.0 - 36.0 g/dL   RDW 25.8 (H) 11.5 - 15.5 %  Platelets 319 150 - 400 K/uL   nRBC 0.0 0.0 - 0.2 %   Neutrophils Relative % 68 %   Neutro Abs 5.6 1.7 - 7.7 K/uL   Lymphocytes Relative 21 %   Lymphs Abs 1.8 0.7 - 4.0 K/uL   Monocytes Relative 9 %   Monocytes Absolute 0.7 0.1 - 1.0 K/uL   Eosinophils Relative 2 %   Eosinophils Absolute 0.1 0.0 - 0.5 K/uL   Basophils Relative 0 %   Basophils Absolute 0.0 0.0 - 0.1 K/uL   RBC Morphology MIX RBC POPULATION, HYPOCHROMIC RBC    Smear Review Normal platelet morphology    Immature Granulocytes 0 %   Abs Immature Granulocytes 0.03 0.00 - 0.07 K/uL   Polychromasia PRESENT     Comment: Performed at [facility], [address], [place], [place] [address]  Basic metabolic panel     Status: Abnormal   Collection Time: 11/20/20  8:42 AM  Result Value Ref Range   Sodium 136 135 - 145 mmol/L   Potassium 4.4 3.5 - 5.1 mmol/L   Chloride 100 98 - 111 mmol/L   CO2 27 22  - 32 mmol/L   Glucose, Bld 153 (H) 70 - 99 mg/dL    Comment: Glucose reference range applies only to samples taken after fasting for at least 8 hours.   BUN 27 (H) 8 - 23 mg/dL   Creatinine, Ser 1.50 (H) 0.44 - 1.00 mg/dL   Calcium 9.1 8.9 - 10.3 mg/dL   GFR, Estimated 39 (L) >60 mL/min    Comment: (NOTE) Calculated using the CKD-EPI Creatinine Equation (2021)    Anion gap 9 5 - 15    Comment: Performed at [facility], [address] [place], [place] [address]  Glucose, capillary     Status: Abnormal   Collection Time: 11/20/20  9:18 AM  Result Value Ref Range   Glucose-Capillary 138 (H) 70 - 99 mg/dL    Comment: Glucose reference range applies only to samples taken after fasting for at least 8 hours.  Surgical pathology     Status: None   Collection Time: 11/20/20 10:08 AM  Result Value Ref Range   SURGICAL PATHOLOGY      SURGICAL PATHOLOGY CASE: [id number] PATIENT: [place] [facility] Report     Specimen Submitted: A. Colon polyp, cecum; cold snare  Clinical History: Cirrhosis.  Colon polyp      DIAGNOSIS: A. COLON POLYP, CECUM; COLD SNARE: - TUBULAR ADENOMA. - NEGATIVE FOR HIGH-GRADE DYSPLASIA AND MALIGNANCY.   GROSS DESCRIPTION: A. Labeled: Cold snare polyp cecum Received: Formalin Collection time: 10:08 AM on 11/20/2020 Placed into formalin time: 10:08 AM on 11/20/2020 Tissue fragment(s): Multiple Size: Aggregate, 0.8 x 0.4 x 0.2 cm Description: Received are 2 fragments of tan soft tissue admixed with intestinal debris.  The ratio of soft tissue to intestinal debris is 80: 20. Entirely submitted in 1 cassette.  RB 11/20/2020  Final Diagnosis performed by Betsy Pries, MD.   Electronically signed 11/21/2020 8:14:13AM The electronic signature indicates that the named Attending Pathologist has evaluated the specimen Technical component performed at [facility], [address] [place], [place] [address] Lab: [phone] Dir: Rush Farmer, MD, MMM  Professional component performed at [facility], [facility], [address], [place], [place] [address] Lab: [phone] Dir: Dellia Nims. Reuel Derby, MD   Cytology - Non PAP;     Status: None   Collection Time: 12/14/20  1:39 PM  Result Value Ref Range  CYTOLOGY - NON GYN      CYTOLOGY - NON PAP CASE: [id number] PATIENT: Margarine Garden Non-Gynecological Cytology Report     Specimen Submitted: A. Peritoneal fluid  Clinical History: Cirrhosis, ascites     DIAGNOSIS: A. PERITONEAL FLUID; ULTRASOUND-GUIDED PARACENTESIS: - NEGATIVE FOR MALIGNANCY. - LOW CELLULARITY FLUID WITH MACROPHAGES, LYMPHOCYTES, A FEW NEUTROPHILS, AND RARE REACTIVE MESOTHELIAL CELLS.  Comment: Total Nucleated Cell Count: 156 per microL Lymphs: 39% Eos: 0 Neutrophils: 11 % Monocyte-Macrophage-Mesothelial cells: 50% Slides reviewed: 1 Cytospin slide, 1 ThinPrep slide, and 1 cell block  GROSS DESCRIPTION: A. Labeled: Abdominal fluid Received: Fresh Collection time: 1:30 PM on 12/14/2020 Placed into formalin time: Not applicable Volume: Approximately 1100 mL Description of fluid and container in which it is received: Received in a glass 1000 mL evacuated container is yellow cloudy fluid. Cytospin slide(s) received: Yes, 1  Specimen material submitted for:  Cell block and ThinPrep  The cell block material is fixed in formalin for 6 hours prior to processing.  RB 12/17/2020   Final Diagnosis performed by Bryan Lemma, MD.   Electronically signed 12/18/2020 2:27:12PM The electronic signature indicates that the named Attending Pathologist has evaluated the specimen Technical component performed at [facility], [address], [place], [place] [address] Lab: [phone] Dir: Rush Farmer, MD, MMM  Professional component performed at [facility], [facility], [place], [place], [place] [address] Lab: [phone] Dir: Dellia Nims. Rubinas, MD   Body  fluid cell count with differential     Status: Abnormal   Collection Time: 12/14/20  1:39 PM  Result Value Ref Range   Fluid Type-FCT CYTO PERI    Color, Fluid YELLOW (A) YELLOW   Appearance, Fluid CLEAR CLEAR   Total Nucleated Cell Count, Fluid 156 cu mm   Neutrophil Count, Fluid 11 %   Lymphs, Fluid 39 %   Monocyte-Macrophage-Serous Fluid 50 %   Eos, Fluid 0 %    Comment: Performed at [facility], [address] [place], [place] [address]  Albumin, pleural or peritoneal fluid      Status: None   Collection Time: 12/14/20  1:39 PM  Result Value Ref Range   Albumin, Fluid <1.0 g/dL    Comment: (NOTE) No normal range established for this test Results should be evaluated in conjunction with serum values    Fluid Type-FALB CYTO PERI     Comment: Performed at [facility], [address] [place], [place] [address]  Protein, pleural or peritoneal fluid     Status: None   Collection Time: 12/14/20  1:39 PM  Result Value Ref Range   Total protein, fluid <3.0 g/dL    Comment: (NOTE) No normal range established for this test Results should be evaluated in conjunction with serum values    Fluid Type-FTP CYTO PERI     Comment: Performed at [facility], [address] [place], [place] [address]  Protein, body fluid (other)     Status: None   Collection Time: 12/14/20  1:39 PM  Result Value Ref Range   Total Protein, Body Fluid Other 1.7 g/dL    Comment: (NOTE)                            _________________________________________                           : BODY FLUID TYPE :     TOTAL  PROTEIN     :                           :_________________:_______________________:                           : Amniotic Fluid  :               <0.4    :                           :_________________:_______________________:                           :                 : Nonmalignant: <3.0    :                           : Ascitic Fluid   : Malignant:    >3.0    :                            :_________________:_______________________:                           : Bile, Clear     :               <0.9    :                           :_________________:_______________________:                           : Bile, Yellow    :          0.2 - 0.6    :                           :_________________:_______________________:                           : Lymph           :          2.2 - 6.0    :                           :_________________:_______________________:                            : Human Milk      :          1.9 - 2.0    :                           :_________________: ______________________:                           : Nasal Secretion :          0.1 - 3.5    :                           :  _________________:_______________________:                           : Pancreatic      :          0.0 - 0.1    :                           : Juice           :    (post stimulation) :                           :_________________:_______________________:                           :                 : Transudate:   <0.3    :                           : Pleural Fluid   : Exudate:      >0.3    :                           :_________________:_______________________:                           : Saliva          :          0.1 - 0.2    :                           : (Mixed Glands)  :                       :                           :_________________:_______________________:                           : Synovial Fluid  :               <2.5    :                            :_________________:_______________________:                           : Tears           :          0.8 - 0.9    :                           :_________________:_______________________:                            Louanne Skye, Karene Fry Reference Intervals  for  Adults and Children 2008. Ninth                            Edition (V9.1) [place],                            [place]; [place]: July  2009. The method performance specifications have not been established for this test in body fluid.  The test result should be integrated into the clinical context for interpretation. Performed At: [facility] [place], [place] [id number] Rush Farmer MD TR:[id number]    Source of Sample PERITONEAL     Comment: Performed at [facility], [address] [place], [place] [address]    RADIOGRAPHIC STUDIES: I have personally reviewed the radiological images as listed and agreed with the findings in the report. No results found.  ASSESSMENT:  Other iron deficiency anemia   PLAN:  Other iron deficiency anemia   All questions were answered. The patient knows to call the clinic with any problems, questions or concerns. I spent {CHL ONC TIME VISIT - ANVBT:[id number]} counseling the patient face to face. The total time spent in the appointment was {CHL ONC TIME VISIT - HFSFS:[id number]} and more than 50% was on counseling.   Derek Jack, MD 01/31/21 8:10 PM  [place] [phone]   I, Thana Ates, am acting as a scribe for Dr. Derek Jack.  {Add Barista Statement}

## 2021-02-01 ENCOUNTER — Ambulatory Visit (HOSPITAL_COMMUNITY): Payer: Managed Care, Other (non HMO) | Admitting: Hematology

## 2021-02-01 NOTE — Patient Instructions (Addendum)
[facility] at [facility] Discharge Instructions  You were seen and examined today by Dr. Delton Coombes. Dr. Delton Coombes is a hematologist, meaning that he specializes in blood abnormalities. Dr. Delton Coombes discussed your past medical history, family history of cancers/blood conditions and the events that led to you being here today.  You were referred to Dr. Delton Coombes due to anemia. Dr. Delton Coombes has recommended additional lab work today.  Follow-up as scheduled.    Thank you for choosing [place] at [facility] to provide your oncology and hematology care.  To afford each patient quality time with our provider, please arrive at least 15 minutes before your scheduled appointment time.   If you have a lab appointment with the [facility] please come in thru the Main Entrance and check in at the main information desk.  You need to re-schedule your appointment should you arrive 10 or more minutes late.  We strive to give you quality time with our providers, and arriving late affects you and other patients whose appointments are after yours.  Also, if you no show three or more times for appointments you may be dismissed from the clinic at the providers discretion.     Again, thank you for choosing [facility].  Our hope is that these requests will decrease the amount of time that you wait before being seen by our physicians.       _____________________________________________________________  Should you have questions after your visit to [facility], please contact our office at [phone] and follow the prompts.  Our office hours are 8:00 a.m. and 4:30 p.m. Monday - Friday.  Please note that voicemails left after 4:00 p.m. may not be returned until the following business day.  We are closed weekends and major holidays.  You do have access to a nurse 24-7, just call the main number to the clinic [phone] and do not  press any options, hold on the line and a nurse will answer the phone.    For prescription refill requests, have your pharmacy contact our office and allow 72 hours.    Due to Covid, you will need to wear a mask upon entering the hospital. If you do not have a mask, a mask will be given to you at the Main Entrance upon arrival. For doctor visits, patients may have 1 support person age 75 or older with them. For treatment visits, patients can not have anyone with them due to social distancing guidelines and our immunocompromised population.

## 2021-02-04 ENCOUNTER — Telehealth (HOSPITAL_COMMUNITY): Payer: Self-pay | Admitting: Occupational Therapy

## 2021-02-04 ENCOUNTER — Other Ambulatory Visit: Payer: Self-pay

## 2021-02-04 ENCOUNTER — Encounter (HOSPITAL_COMMUNITY): Payer: Self-pay | Admitting: Physical Therapy

## 2021-02-04 ENCOUNTER — Ambulatory Visit (HOSPITAL_COMMUNITY): Payer: Managed Care, Other (non HMO) | Attending: Internal Medicine | Admitting: Physical Therapy

## 2021-02-04 DIAGNOSIS — M6281 Muscle weakness (generalized): Secondary | ICD-10-CM

## 2021-02-04 DIAGNOSIS — R262 Difficulty in walking, not elsewhere classified: Secondary | ICD-10-CM | POA: Diagnosis present

## 2021-02-04 DIAGNOSIS — R2689 Other abnormalities of gait and mobility: Secondary | ICD-10-CM | POA: Diagnosis present

## 2021-02-04 NOTE — Therapy (Signed)
[place], [place], [address] Phone: [phone]   Fax:  [phone]  Physical Therapy Evaluation  Patient Details  Name: Rachel Huber MRN: [id number] Date of Birth: 1956-04-20 Referring Provider (PT): Mortimer Fries PA-C   Encounter Date: 02/04/2021   PT End of Session - 02/04/21 1607     Visit Number 1    Number of Visits 12    Date for PT Re-Evaluation 03/18/21    Authorization Type [employer]    Authorization Time Period VL based on medical necessity, 60 total    Authorization - Visit Number 4    Authorization - Number of Visits 60    Progress Note Due on Visit 10    PT Start Time 1430    PT Stop Time 1505    PT Time Calculation (min) 35 min    Activity Tolerance Patient tolerated treatment well    Behavior During Therapy WFL for tasks assessed/performed             Past Medical History:  Diagnosis Date   Anxiety    Arthritis    Cirrhosis of liver ([place])    Diabetes mellitus without complication ([place])    Dyspnea    DOE   GERD (gastroesophageal reflux disease)    Hepatitis    PAST   Hypertension    Neuropathy    Neuropathy, diabetic ([place])    Pneumonia    Sinus complaint    Sleep apnea    CPAP   Thalassemia minor    Thalassemia minor     Past Surgical History:  Procedure Laterality Date   ABDOMINAL HYSTERECTOMY     BREAST BIOPSY Right 02/15/2018   [place] bx 6-6:30 ribbon shape, ONE CORE FRAGMENT WITH FIBROSIS. ONE CORE FRAGMENT WITH PORTION OF A DILATED   BREAST BIOPSY Right 02/15/2018   [place] bx 9:00 heart shape, USUAL DUCTAL HYPERPLASIA   BREAST LUMPECTOMY Right 03/09/2018   Procedure: BREAST LUMPECTOMY x 2;  Surgeon: Benjamine Sprague, DO;  Location: [facility] ORS;  Service: General;  Laterality: Right;   CATARACT EXTRACTION W/PHACO Left 06/11/2016   Procedure: CATARACT EXTRACTION PHACO AND INTRAOCULAR LENS PLACEMENT (IOC);  Surgeon: Estill Cotta, MD;  Location: [facility] ORS;  Service: Ophthalmology;   Laterality: Left;  Lot # [id number] H US:01:38.6 AP%:26.4 CDE:44.15   CATARACT EXTRACTION W/PHACO Right 06/15/2018   Procedure: CATARACT EXTRACTION PHACO AND INTRAOCULAR LENS PLACEMENT (IOC);  Surgeon: Birder Robson, MD;  Location: [facility] ORS;  Service: Ophthalmology;  Laterality: Right;  [place] 00:38.2 CDE 4.23 Fluid Pack Lot # [id number]   COLONOSCOPIES     COLONOSCOPY WITH PROPOFOL N/A 10/10/2020   Procedure: COLONOSCOPY WITH PROPOFOL;  Surgeon: Lesly Rubenstein, MD;  Location: [facility] ENDOSCOPY;  Service: Endoscopy;  Laterality: N/A;   COLONOSCOPY WITH PROPOFOL N/A 11/20/2020   Procedure: COLONOSCOPY WITH PROPOFOL;  Surgeon: Lesly Rubenstein, MD;  Location: [facility] ENDOSCOPY;  Service: Endoscopy;  Laterality: N/A;  DM STAT CBC, BMP COVID POSITIVE 09/02/2020   CTR     ESOPHAGOGASTRODUODENOSCOPY (EGD) WITH PROPOFOL N/A 10/10/2020   Procedure: ESOPHAGOGASTRODUODENOSCOPY (EGD) WITH PROPOFOL;  Surgeon: Lesly Rubenstein, MD;  Location: [facility] ENDOSCOPY;  Service: Endoscopy;  Laterality: N/A;  COVID POSITIVE 10/08/2020   JOINT REPLACEMENT     KNEE SURGERY Right 09/24/2017   plates and pins   TOTAL SHOULDER ARTHROPLASTY Right 09/27/2015    There were no vitals filed for this visit.    Subjective Assessment - 02/04/21 1530     Subjective  Patient is a 65 y.o. female who presents to physical therapy with c/o weakness. She feels she has lost a lot of strength in arms and her legs. She has fallen 3 times since April. She feels her mobility gets worse after each fall. Her legs just give out or she fell backward several times. Her main goal is to get to walk again without AD. She has been having sporatic pain in her wrist.    Limitations Standing;Walking;House hold activities    How long can you walk comfortably? 1 minute    Patient Stated Goals improve walking    Currently in Pain? No/denies                [facility] PT Assessment - 02/04/21 0001       Assessment   Medical Diagnosis R hand pain,  Weakness, Cirrhosis of liver, weakness/balance    Referring Provider (PT) Mortimer Fries PA-C    Onset Date/Surgical Date 09/01/20    Prior Therapy Yes      Precautions   Precautions Fall      Restrictions   Weight Bearing Restrictions No      Balance Screen   Has the patient fallen in the past 6 months Yes    How many times? 4    Has the patient had a decrease in activity level because of a fear of falling?  Yes    Is the patient reluctant to leave their home because of a fear of falling?  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    Leisure spending time with grandkids      Cognition   Overall Cognitive Status Within Functional Limits for tasks assessed      Observation/Other Assessments   Observations Ambulates with RW, slow, labored transfers    Focus on Therapeutic Outcomes (FOTO)  n/a      Sensation   Light Touch Appears Intact      Strength   Strength Assessment Site Hip;Knee;Ankle    Right/Left Hip Right;Left    Right Hip Flexion 3-/5    Left Hip Flexion 3-/5    Right/Left Knee Right;Left    Right Knee Flexion 4/5    Right Knee Extension 4/5    Left Knee Flexion 4/5    Left Knee Extension 4/5    Right/Left Ankle Right;Left    Right Ankle Dorsiflexion 4+/5    Left Ankle Dorsiflexion 4/5      Transfers   Comments labored, requires use of hands and sitting on elevated surface      Ambulation/Gait   Ambulation/Gait Yes    Ambulation/Gait Assistance 4: Min guard;5: Supervision    Ambulation Distance (Feet) 95 Feet    Assistive device Rolling walker    Gait Pattern Trunk flexed    Gait Comments 2MWT, slow, labored cadence with RW                        Objective measurements completed on examination: See above findings.       [facility] Adult PT Treatment/Exercise - 02/04/21 0001       Knee/Hip Exercises: Seated   Long Arc Quad  10 reps;Both    Other Seated Knee/Hip Exercises ankle pumps x 10    Marching 10 reps  PT Education - 02/04/21 1526     Education Details Patient educated on POC, exam findings, scope of PT, HEP    Person(s) Educated Patient    Methods Explanation;Demonstration    Comprehension Verbalized understanding;Returned demonstration              PT Short Term Goals - 02/04/21 1611       PT SHORT TERM GOAL #1   Title Patient will be independent with HEP in order to improve functional outcomes.    Time 3    Period Weeks    Status New    Target Date 02/25/21      PT SHORT TERM GOAL #2   Title Patient will report at least 25% improvement in symptoms for improved quality of life.    Time 3    Period Weeks    Status New    Target Date 02/25/21      PT SHORT TERM GOAL #3   Title Demo improved gait tolerance as evidenced by distance of 150 ft during 2MWT    Time 3    Period Weeks    Status New    Target Date 02/25/21               PT Long Term Goals - 02/04/21 1612       PT LONG TERM GOAL #1   Title Patient will report at least 75% improvement in symptoms for improved quality of life.    Time 6    Period Weeks    Status New    Target Date 03/18/21      PT LONG TERM GOAL #2   Title Patient will be able to ambulate at least 226 feet in 2MWT with LRAD in order to demonstrate improved gait speed for community ambulation.    Time 6    Period Weeks    Status New    Target Date 03/18/21      PT LONG TERM GOAL #3   Title Pt will be able to perform 5xSTS in 15 sec or < with no UE support and proper mechanics to demo improved balance and functional BLE strength.    Time 6    Period Weeks    Status New    Target Date 03/18/21                    Plan - 02/04/21 1608     Clinical Impression Statement Patient is a 65 y.o. female who presents to physical therapy with c/o weakness. She presents with deficits in bilateral LE strength,  gait, balance, endurance, and functional mobility with ADL. She is having to modify and restrict ADL as indicated by subjective information and objective measures which is affecting overall participation. Patient will benefit from skilled physical therapy in order to improve function and reduce impairment.    Personal Factors and Comorbidities Comorbidity 1;Time since onset of injury/illness/exacerbation;Past/Current Experience;Comorbidity 2;Fitness    Comorbidities PMH and hx of mutiple physical trauma, Cirrhosis    Examination-Activity Limitations Bathing;Bed Mobility;Bend;Lift;Toileting;Stand;Squat;Locomotion Level;Transfers    Examination-Participation Restrictions Cleaning;Community Activity;Driving;Shop;Occupation;Meal Prep;Yard Work;Volunteer    Stability/Clinical Decision Making Stable/Uncomplicated    Clinical Decision Making Low    Rehab Potential Good    PT Frequency 2x / week    PT Duration 6 weeks    PT Treatment/Interventions ADLs/Self Care Home Management;Aquatic Therapy;Biofeedback;Cryotherapy;Electrical Stimulation;DME Instruction;Ultrasound;Moist Heat;Gait training;Stair training;Functional mobility training;Therapeutic activities;Therapeutic exercise;Balance training;Patient/family education;Neuromuscular re-education;Manual techniques;Taping;Energy conservation;Dry needling;Spinal Manipulations;Joint Manipulations;Compression bandaging;Manual lymph drainage    PT  Next Visit Plan LE strengthening, Functional strengthening, gait/balance training, begin UE strengthening as well    PT Home Exercise Plan marching, LAQ, ankle pumps    Consulted and Agree with Plan of Care Patient             Patient will benefit from skilled therapeutic intervention in order to improve the following deficits and impairments:  Abnormal gait, Decreased activity tolerance, Decreased balance, Decreased mobility, Decreased knowledge of use of DME, Decreased endurance, Decreased range of motion,  Decreased strength, Difficulty walking, Impaired perceived functional ability, Improper body mechanics, Pain  Visit Diagnosis: Muscle weakness (generalized)  Difficulty in walking, not elsewhere classified  Other abnormalities of gait and mobility     Problem List Patient Active Problem List   Diagnosis Date Noted   Decompensated hepatic cirrhosis ([place]) 10/25/2020   OSA (obstructive sleep apnea) 10/25/2020   Glossitis 10/25/2020   Grade IV internal hemorrhoids    Acute blood loss anemia 10/08/2020   Hyponatremia 10/08/2020   Rectal bleeding 10/08/2020   Acute respiratory failure with hypoxia ([place]) 09/10/2020   Pneumonia due to COVID-19 virus 09/08/2020   Hip fracture, right ([place]) 09/04/2020   Closed right hip fracture ([place]) 09/02/2020   Diabetes mellitus with hyperglycemia ([place]) 09/02/2020   Hypertension    Thalassemia minor    Pancytopenia ([place])    HLD (hyperlipidemia)    Acute renal failure superimposed on stage 3a chronic kidney disease ([place])    Depression    Fall at home, initial encounter    Nondisplaced fracture of greater trochanter of right femur, initial encounter for closed fracture ([place])     4:15 PM, 02/04/21 Mearl Latin PT, DPT Physical Therapist at [place], [place], [address] Phone: [phone]   Fax:  [phone]  Name: Rachel Huber MRN: [id number] Date of Birth: 1955-12-11

## 2021-02-04 NOTE — Patient Instructions (Signed)
Access Code: [email or web address] URL: [email or web address] Date: 02/04/2021 Prepared by: Margie Billet  Exercises Seated March - 3 x daily - 7 x weekly - 1 sets - 10 reps Seated Long Arc Quad - 3 x daily - 7 x weekly - 10 reps - 3-5 seconds hold Seated Ankle Pumps on Table - 3 x daily - 7 x weekly - 10 reps

## 2021-02-06 ENCOUNTER — Ambulatory Visit (HOSPITAL_COMMUNITY): Payer: Managed Care, Other (non HMO) | Admitting: Physical Therapy

## 2021-02-08 ENCOUNTER — Other Ambulatory Visit: Payer: Self-pay | Admitting: Gastroenterology

## 2021-02-08 DIAGNOSIS — K7460 Unspecified cirrhosis of liver: Secondary | ICD-10-CM

## 2021-02-08 DIAGNOSIS — R188 Other ascites: Secondary | ICD-10-CM

## 2021-02-11 ENCOUNTER — Ambulatory Visit
Admission: RE | Admit: 2021-02-11 | Discharge: 2021-02-11 | Disposition: A | Discharge status: Home / Self Care | Payer: Managed Care, Other (non HMO) | Source: Ambulatory Visit | Attending: Gastroenterology | Admitting: Gastroenterology

## 2021-02-11 ENCOUNTER — Other Ambulatory Visit: Payer: Self-pay

## 2021-02-11 DIAGNOSIS — K7460 Unspecified cirrhosis of liver: Secondary | ICD-10-CM

## 2021-02-11 DIAGNOSIS — R188 Other ascites: Secondary | ICD-10-CM | POA: Insufficient documentation

## 2021-02-11 LAB — PROTEIN, PLEURAL OR PERITONEAL FLUID: Total protein, fluid: <3 g/dL

## 2021-02-11 LAB — BODY FLUID CELL COUNT WITH DIFFERENTIAL
Eos, Fluid: 0 %
Lymphs, Fluid: 66 %
Monocyte-Macrophage-Serous Fluid: 27 %
Neutrophil Count, Fluid: 7 %
Total Nucleated Cell Count, Fluid: 166 cu mm

## 2021-02-11 LAB — ALBUMIN, PLEURAL OR PERITONEAL FLUID: Albumin, Fluid: 1.1 g/dL

## 2021-02-11 MED ORDER — ALBUMIN HUMAN 25 % IV SOLN
INTRAVENOUS | Status: AC
  Administered 2021-02-11: 25 g
  Filled 2021-02-11: qty 100

## 2021-02-11 MED ORDER — ALBUMIN HUMAN 25 % IV SOLN
INTRAVENOUS | Status: AC
  Filled 2021-02-11: qty 100

## 2021-02-11 NOTE — Procedures (Signed)
PROCEDURE SUMMARY:  Successful image-guided paracentesis from the right lower abdomen.  Yielded 8.9 liters of clear yellow fluid.  No immediate complications.  EBL = trace. Patient tolerated well.   Specimen was sent for labs.  Please see imaging section of Epic for full dictation.   Armando Gang Mikia Delaluz PA-C 02/11/2021 1:29 PM

## 2021-02-12 ENCOUNTER — Ambulatory Visit (HOSPITAL_COMMUNITY): Payer: Managed Care, Other (non HMO) | Admitting: Physical Therapy

## 2021-02-12 ENCOUNTER — Encounter (HOSPITAL_COMMUNITY): Payer: Self-pay | Admitting: Physical Therapy

## 2021-02-12 DIAGNOSIS — R2689 Other abnormalities of gait and mobility: Secondary | ICD-10-CM

## 2021-02-12 DIAGNOSIS — M6281 Muscle weakness (generalized): Secondary | ICD-10-CM

## 2021-02-12 DIAGNOSIS — R262 Difficulty in walking, not elsewhere classified: Secondary | ICD-10-CM

## 2021-02-12 LAB — PROTEIN, BODY FLUID (OTHER): Total Protein, Body Fluid Other: 2.1 g/dL

## 2021-02-12 NOTE — Patient Instructions (Signed)
Access Code: [facility] URL: [email or web address] Date: 02/12/2021 Prepared by: Josue Hector  Exercises Sit to Stand with Counter Support - 3 x daily - 7 x weekly - 1-2 sets - 5 reps

## 2021-02-12 NOTE — Therapy (Signed)
[place], [place], [address] Phone: [phone]   Fax:  [phone]  Physical Therapy Treatment  Patient Details  Name: Rachel Huber MRN: [id number] Date of Birth: 1955/11/26 Referring Provider (PT): Mortimer Fries PA-C   Encounter Date: 02/12/2021   PT End of Session - 02/12/21 1012     Visit Number 2    Number of Visits 12    Date for PT Re-Evaluation 03/18/21    Authorization Type [employer]    Authorization Time Period VL based on medical necessity, 60 total    Authorization - Visit Number 5    Authorization - Number of Visits 60    Progress Note Due on Visit 10    PT Start Time 1005   arrived late   PT Stop Time 1030    PT Time Calculation (min) 25 min    Activity Tolerance Patient tolerated treatment well    Behavior During Therapy WFL for tasks assessed/performed             Past Medical History:  Diagnosis Date   Anxiety    Arthritis    Cirrhosis of liver ([place])    Diabetes mellitus without complication ([place])    Dyspnea    DOE   GERD (gastroesophageal reflux disease)    Hepatitis    PAST   Hypertension    Neuropathy    Neuropathy, diabetic ([place])    Pneumonia    Sinus complaint    Sleep apnea    CPAP   Thalassemia minor    Thalassemia minor     Past Surgical History:  Procedure Laterality Date   ABDOMINAL HYSTERECTOMY     BREAST BIOPSY Right 02/15/2018   [place] bx 6-6:30 ribbon shape, ONE CORE FRAGMENT WITH FIBROSIS. ONE CORE FRAGMENT WITH PORTION OF A DILATED   BREAST BIOPSY Right 02/15/2018   [place] bx 9:00 heart shape, USUAL DUCTAL HYPERPLASIA   BREAST LUMPECTOMY Right 03/09/2018   Procedure: BREAST LUMPECTOMY x 2;  Surgeon: Benjamine Sprague, DO;  Location: [facility] ORS;  Service: General;  Laterality: Right;   CATARACT EXTRACTION W/PHACO Left 06/11/2016   Procedure: CATARACT EXTRACTION PHACO AND INTRAOCULAR LENS PLACEMENT (IOC);  Surgeon: Estill Cotta, MD;  Location: [facility] ORS;  Service:  Ophthalmology;  Laterality: Left;  Lot # [id number] H US:01:38.6 AP%:26.4 CDE:44.15   CATARACT EXTRACTION W/PHACO Right 06/15/2018   Procedure: CATARACT EXTRACTION PHACO AND INTRAOCULAR LENS PLACEMENT (IOC);  Surgeon: Birder Robson, MD;  Location: [facility] ORS;  Service: Ophthalmology;  Laterality: Right;  [place] 00:38.2 CDE 4.23 Fluid Pack Lot # [id number]   COLONOSCOPIES     COLONOSCOPY WITH PROPOFOL N/A 10/10/2020   Procedure: COLONOSCOPY WITH PROPOFOL;  Surgeon: Lesly Rubenstein, MD;  Location: [facility] ENDOSCOPY;  Service: Endoscopy;  Laterality: N/A;   COLONOSCOPY WITH PROPOFOL N/A 11/20/2020   Procedure: COLONOSCOPY WITH PROPOFOL;  Surgeon: Lesly Rubenstein, MD;  Location: [facility] ENDOSCOPY;  Service: Endoscopy;  Laterality: N/A;  DM STAT CBC, BMP COVID POSITIVE 09/02/2020   CTR     ESOPHAGOGASTRODUODENOSCOPY (EGD) WITH PROPOFOL N/A 10/10/2020   Procedure: ESOPHAGOGASTRODUODENOSCOPY (EGD) WITH PROPOFOL;  Surgeon: Lesly Rubenstein, MD;  Location: [facility] ENDOSCOPY;  Service: Endoscopy;  Laterality: N/A;  COVID POSITIVE 10/08/2020   JOINT REPLACEMENT     KNEE SURGERY Right 09/24/2017   plates and pins   TOTAL SHOULDER ARTHROPLASTY Right 09/27/2015    There were no vitals filed for this visit.   Subjective Assessment - 02/12/21 1011  Subjective Patient reports ongoing trouble with balance and walking. "not too awful bad" with HEP, forgets to do sometimes.    Limitations Standing;Walking;House hold activities    How long can you walk comfortably? 1 minute    Patient Stated Goals improve walking    Currently in Pain? No/denies                               [facility] Adult PT Treatment/Exercise - 02/12/21 0001       Knee/Hip Exercises: Seated   Long Arc Quad Both;2 sets;10 reps    Other Seated Knee/Hip Exercises ankle pumps x 20    Marching 20 reps    Sit to Sand 5 reps;with UE support   elevated low mat                     PT Short Term Goals - 02/04/21  1611       PT SHORT TERM GOAL #1   Title Patient will be independent with HEP in order to improve functional outcomes.    Time 3    Period Weeks    Status New    Target Date 02/25/21      PT SHORT TERM GOAL #2   Title Patient will report at least 25% improvement in symptoms for improved quality of life.    Time 3    Period Weeks    Status New    Target Date 02/25/21      PT SHORT TERM GOAL #3   Title Demo improved gait tolerance as evidenced by distance of 150 ft during 2MWT    Time 3    Period Weeks    Status New    Target Date 02/25/21               PT Long Term Goals - 02/04/21 1612       PT LONG TERM GOAL #1   Title Patient will report at least 75% improvement in symptoms for improved quality of life.    Time 6    Period Weeks    Status New    Target Date 03/18/21      PT LONG TERM GOAL #2   Title Patient will be able to ambulate at least 226 feet in 2MWT with LRAD in order to demonstrate improved gait speed for community ambulation.    Time 6    Period Weeks    Status New    Target Date 03/18/21      PT LONG TERM GOAL #3   Title Pt will be able to perform 5xSTS in 15 sec or < with no UE support and proper mechanics to demo improved balance and functional BLE strength.    Time 6    Period Weeks    Status New    Target Date 03/18/21                   Plan - 02/12/21 1025     Clinical Impression Statement Activities limited secondary to late arrival. Reviewed goals and HEP exercises. Progressed functional strength to include sit to stands using UEs. Educated patient on addition of sit to stand to HEP from elevated surface. Patient showing good tolerance overall today, demos mild fatigue. Patient will continue to benefit from skilled therapy services to reduce deficits and improve functional ability.    Personal Factors and Comorbidities Comorbidity 1;Time since onset of injury/illness/exacerbation;Past/Current Experience;Comorbidity 2;Fitness  Comorbidities PMH and hx of mutiple physical trauma, Cirrhosis    Examination-Activity Limitations Bathing;Bed Mobility;Bend;Lift;Toileting;Stand;Squat;Locomotion Level;Transfers    Examination-Participation Restrictions Cleaning;Community Activity;Driving;Shop;Occupation;Meal Prep;Yard Work;Volunteer    Stability/Clinical Decision Making Stable/Uncomplicated    Rehab Potential Good    PT Frequency 2x / week    PT Duration 6 weeks    PT Treatment/Interventions ADLs/Self Care Home Management;Aquatic Therapy;Biofeedback;Cryotherapy;Electrical Stimulation;DME Instruction;Ultrasound;Moist Heat;Gait training;Stair training;Functional mobility training;Therapeutic activities;Therapeutic exercise;Balance training;Patient/family education;Neuromuscular re-education;Manual techniques;Taping;Energy conservation;Dry needling;Spinal Manipulations;Joint Manipulations;Compression bandaging;Manual lymph drainage    PT Next Visit Plan LE strengthening, Functional strengthening, gait/balance training, begin UE strengthening as well    PT Home Exercise Plan marching, LAQ, ankle pumps 6/28 sit/stands    Consulted and Agree with Plan of Care Patient             Patient will benefit from skilled therapeutic intervention in order to improve the following deficits and impairments:  Abnormal gait, Decreased activity tolerance, Decreased balance, Decreased mobility, Decreased knowledge of use of DME, Decreased endurance, Decreased range of motion, Decreased strength, Difficulty walking, Impaired perceived functional ability, Improper body mechanics, Pain  Visit Diagnosis: Muscle weakness (generalized)  Difficulty in walking, not elsewhere classified  Other abnormalities of gait and mobility     Problem List Patient Active Problem List   Diagnosis Date Noted   Decompensated hepatic cirrhosis ([place]) 10/25/2020   OSA (obstructive sleep apnea) 10/25/2020   Glossitis 10/25/2020   Grade IV internal hemorrhoids     Acute blood loss anemia 10/08/2020   Hyponatremia 10/08/2020   Rectal bleeding 10/08/2020   Acute respiratory failure with hypoxia ([place]) 09/10/2020   Pneumonia due to COVID-19 virus 09/08/2020   Hip fracture, right ([place]) 09/04/2020   Closed right hip fracture ([place]) 09/02/2020   Diabetes mellitus with hyperglycemia ([place]) 09/02/2020   Hypertension    Thalassemia minor    Pancytopenia ([place])    HLD (hyperlipidemia)    Acute renal failure superimposed on stage 3a chronic kidney disease ([place])    Depression    Fall at home, initial encounter    Nondisplaced fracture of greater trochanter of right femur, initial encounter for closed fracture ([place])    10:30 AM, 02/12/21 Josue Hector PT DPT  Physical Therapist with [facility]  [phone] [place] [address] [place], [place], [address] Phone: [phone]   Fax:  [phone]  Name: Rachel Huber MRN: [id number] Date of Birth: 1956/01/29

## 2021-02-13 ENCOUNTER — Inpatient Hospital Stay (HOSPITAL_COMMUNITY): Payer: Managed Care, Other (non HMO) | Attending: Hematology and Oncology | Admitting: Hematology and Oncology

## 2021-02-13 ENCOUNTER — Inpatient Hospital Stay (HOSPITAL_COMMUNITY): Payer: Managed Care, Other (non HMO)

## 2021-02-13 ENCOUNTER — Other Ambulatory Visit: Payer: Self-pay

## 2021-02-13 ENCOUNTER — Encounter (HOSPITAL_COMMUNITY): Payer: Self-pay | Admitting: Hematology and Oncology

## 2021-02-13 DIAGNOSIS — K7460 Unspecified cirrhosis of liver: Secondary | ICD-10-CM | POA: Insufficient documentation

## 2021-02-13 DIAGNOSIS — D500 Iron deficiency anemia secondary to blood loss (chronic): Secondary | ICD-10-CM | POA: Diagnosis present

## 2021-02-13 DIAGNOSIS — R600 Localized edema: Secondary | ICD-10-CM | POA: Diagnosis not present

## 2021-02-13 DIAGNOSIS — Z79899 Other long term (current) drug therapy: Secondary | ICD-10-CM | POA: Insufficient documentation

## 2021-02-13 DIAGNOSIS — K625 Hemorrhage of anus and rectum: Secondary | ICD-10-CM | POA: Insufficient documentation

## 2021-02-13 DIAGNOSIS — I10 Essential (primary) hypertension: Secondary | ICD-10-CM | POA: Diagnosis not present

## 2021-02-13 DIAGNOSIS — D563 Thalassemia minor: Secondary | ICD-10-CM

## 2021-02-13 DIAGNOSIS — R0602 Shortness of breath: Secondary | ICD-10-CM | POA: Diagnosis not present

## 2021-02-13 DIAGNOSIS — K643 Fourth degree hemorrhoids: Secondary | ICD-10-CM

## 2021-02-13 DIAGNOSIS — Z8542 Personal history of malignant neoplasm of other parts of uterus: Secondary | ICD-10-CM | POA: Diagnosis not present

## 2021-02-13 DIAGNOSIS — K7290 Hepatic failure, unspecified without coma: Secondary | ICD-10-CM

## 2021-02-13 LAB — RETICULOCYTES
Immature Retic Fract: 22.3 % — ABNORMAL HIGH (ref 2.3–15.9)
RBC.: 4.73 MIL/uL (ref 3.87–5.11)
Retic Count, Absolute: 102.6 10*3/uL (ref 19.0–186.0)
Retic Ct Pct: 2.2 % (ref 0.4–3.1)

## 2021-02-13 LAB — IRON AND TIBC
Iron: 19 ug/dL — ABNORMAL LOW (ref 28–170)
Saturation Ratios: 5 % — ABNORMAL LOW (ref 10.4–31.8)
TIBC: 362 ug/dL (ref 250–450)
UIBC: 343 ug/dL

## 2021-02-13 LAB — VITAMIN B12: Vitamin B-12: 339 pg/mL (ref 180–914)

## 2021-02-13 LAB — CYTOLOGY - NON PAP

## 2021-02-13 LAB — SEDIMENTATION RATE: Sed Rate: 18 mm/hr (ref 0–22)

## 2021-02-13 LAB — FERRITIN: Ferritin: 16 ng/mL (ref 11–307)

## 2021-02-13 NOTE — Assessment & Plan Note (Signed)
She has recurrent malignant ascites and signs of liver cirrhosis I would defer to her gastroenterologist for further evaluation and management She has signs of portal hypertension on EGD She is aware that she should avoid NSAID and aspirin containing medications indefinitely

## 2021-02-13 NOTE — Assessment & Plan Note (Signed)
This is a high risk patient She is not able to tolerate oral iron supplement and has been ineffective I do not recommend her to take oral iron supplement as it can exacerbate her abdominal symptoms and make it hard to check if she indeed have GI bleed The most likely cause of her anemia is due to chronic blood loss/malabsorption syndrome. We discussed some of the risks, benefits, and alternatives of intravenous iron infusions. The patient is symptomatic from anemia and the iron level is critically low. She tolerated oral iron supplement poorly and desires to achieved higher levels of iron faster for adequate hematopoesis. Some of the side-effects to be expected including risks of infusion reactions, phlebitis, headaches, nausea and fatigue.  The patient is willing to proceed. Patient education material was dispensed.  Goal is to keep ferritin level greater than 50 and resolution of anemia We will get baseline studies today If her hemoglobin is very low, we will order blood transfusion instead She will receive 3 doses of IV iron sucrose 300 mg and then return 2 months afterwards for further evaluation

## 2021-02-13 NOTE — Assessment & Plan Note (Signed)
Her last CT imaging of her abdomen and pelvis show no evidence of cancer recurrence

## 2021-02-13 NOTE — Assessment & Plan Note (Signed)
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

## 2021-02-13 NOTE — Assessment & Plan Note (Signed)
She denies recent significant hemorrhoidal bleeding

## 2021-02-13 NOTE — Progress Notes (Signed)
[facility] NOTE  Patient Care Team: [name], MD as PCP - General (Internal Medicine)  CHIEF COMPLAINTS/PURPOSE OF CONSULTATION:  Severe iron deficiency anemia  HISTORY OF PRESENTING ILLNESS:  Rachel Huber 65 y.o. female is here because of findings of severe iron deficiency anemia The patient had history of endometrial cancer She was recently diagnosed with liver cirrhosis requiring frequent paracentesis She was originally referred her in March but the appointment was canceled/rescheduled She returns here today for further evaluation She was found to have abnormal CBC since 2019 Her hemoglobin has been as low as 4.6 She has received blood transfusions before She is known to have thalassemia; her father, sister and brother all had thalassemia.  The highest hemoglobin is around 9-10 Most recently, on November 20, 2020, hemoglobin was 8.8 with MCV as low as 59.5 and platelet count 319 She presented at that time with severe rectal bleeding. In the past, she has taken ibuprofen for headaches but has not had any NSAID or aspirin use since her diagnosis of liver cirrhosis and rectal bleeding Iron studies performed in March confirm iron deficiency  Last colonoscopy was on 11/20/2020 which showed - Preparation of the colon was inadequate. - Stool in the entire examined colon. - One 2 mm polyp in the cecum, removed with a cold snare. Resected and retrieved. - Internal hemorrhoids. - The examination was otherwise normal on direct and retroflexion views.  Last EGD was on 10/12/2020 which showed gastric mucosa variant, erythematous changes without active bleeding and signs of portal hypertensive gastropathy  Her last blood transfusion was on 10/12/2020 She has chronic bilateral lower extremity edema and shortness of breath on minimal exertion She denies recent chest pain on exertion, pre-syncopal episodes, or palpitations. She had not noticed any recent bleeding  such as epistaxis, hematuria or hematochezia The patient denies over the counter NSAID ingestion. She is not on antiplatelets agents.  Her age appropriate screening programs are up-to-date. She denies any pica and eats a variety of diet. The patient was prescribed oral iron supplements and she takes that for 2 months without any success of improving her anemia  MEDICAL HISTORY:  Past Medical History:  Diagnosis Date   Anxiety    Arthritis    Cirrhosis of liver ([place])    Diabetes mellitus without complication (HCC)    Dyspnea    DOE   GERD (gastroesophageal reflux disease)    Hepatitis    PAST   Hypertension    Neuropathy    Neuropathy, diabetic (HCC)    Pneumonia    Sinus complaint    Sleep apnea    CPAP   Thalassemia minor 1992    SURGICAL HISTORY: Past Surgical History:  Procedure Laterality Date   ABDOMINAL HYSTERECTOMY     BREAST BIOPSY Right 02/15/2018   [place] bx 6-6:30 ribbon shape, ONE CORE FRAGMENT WITH FIBROSIS. ONE CORE FRAGMENT WITH PORTION OF A DILATED   BREAST BIOPSY Right 02/15/2018   [place] bx 9:00 heart shape, USUAL DUCTAL HYPERPLASIA   BREAST LUMPECTOMY Right 03/09/2018   Procedure: BREAST LUMPECTOMY x 2;  Surgeon: Benjamine Sprague, DO;  Location: [facility] ORS;  Service: General;  Laterality: Right;   CATARACT EXTRACTION W/PHACO Left 06/11/2016   Procedure: CATARACT EXTRACTION PHACO AND INTRAOCULAR LENS PLACEMENT (IOC);  Surgeon: Estill Cotta, MD;  Location: [facility];  Service: Ophthalmology;  Laterality: Left;  Lot # [id number] H US:01:38.6 AP%:26.4 CDE:44.15   CATARACT EXTRACTION W/PHACO Right 06/15/2018   Procedure: CATARACT EXTRACTION PHACO  AND INTRAOCULAR LENS PLACEMENT (IOC);  Surgeon: Birder Robson, MD;  Location: [facility];  Service: Ophthalmology;  Laterality: Right;  [place] 00:38.2 CDE 4.23 Fluid Pack Lot # [id number]   COLONOSCOPIES     COLONOSCOPY WITH PROPOFOL N/A 10/10/2020   Procedure: COLONOSCOPY WITH PROPOFOL;  Surgeon: Lesly Rubenstein, MD;   Location: [facility] END[facility];  Service: Endoscopy;  Laterality: N/A;   COLONOSCOPY WITH PROPOFOL N/A 11/20/2020   Procedure: COLONOSCOPY WITH PROPOFOL;  Surgeon: Lesly Rubenstein, MD;  Location: [facility] ENDOSCOPY;  Service: Endoscopy;  Laterality: N/A;  DM STAT CBC, BMP COVID POSITIVE 09/02/2020   CTR     ESOPHAGOGASTRODUODENOSCOPY (EGD) WITH PROPOFOL N/A 10/10/2020   Procedure: ESOPHAGOGASTRODUODENOSCOPY (EGD) WITH PROPOFOL;  Surgeon: Lesly Rubenstein, MD;  Location: [facility] ENDOSCOPY;  Service: Endoscopy;  Laterality: N/A;  COVID POSITIVE 10/08/2020   JOINT REPLACEMENT     KNEE SURGERY Right 09/24/2017   plates and pins   TOTAL SHOULDER ARTHROPLASTY Right 09/27/2015    SOCIAL HISTORY: Social History   Socioeconomic History   Marital status: Married    Spouse name: Not on file   Number of children: 0   Years of education: Not on file   Highest education level: Not on file  Occupational History   Occupation: EMPLOYED  Tobacco Use   Smoking status: Never   Smokeless tobacco: Never  Vaping Use   Vaping Use: Never used  Substance and Sexual Activity   Alcohol use: No   Drug use: Never   Sexual activity: Not Currently  Other Topics Concern   Not on file  Social History Narrative   Not on file   Social Determinants of Health   Financial Resource Strain: Low Risk    Difficulty of Paying Living Expenses: Not hard at all  Food Insecurity: No Food Insecurity   Worried About Charity fundraiser in the Last Year: Never true   [place] in the Last Year: Never true  Transportation Needs: No Transportation Needs   Lack of Transportation (Medical): No   Lack of Transportation (Non-Medical): No  Physical Activity: Inactive   Days of Exercise per Week: 0 days   Minutes of Exercise per Session: 0 min  Stress: No Stress Concern Present   Feeling of Stress : Not at all  Social Connections: Moderately Isolated   Frequency of Communication with Friends and Family: More than three times  a week   Frequency of Social Gatherings with Friends and Family: Once a week   Attends Religious Services: Never   Marine scientist or Organizations: No   Attends Music therapist: Never   Marital Status: Married  Human resources officer Violence: Not At Risk   Fear of Current or Ex-Partner: No   Emotionally Abused: No   Physically Abused: No   Sexually Abused: No    FAMILY HISTORY: Family History  Problem Relation Age of Onset   Breast cancer Mother 41   Lymphoma Mother    Diabetes Father    Kidney cancer Father    Heart disease Father    Diabetes Sister    Breast cancer Sister 91    ALLERGIES:  is allergic to latex.  MEDICATIONS:  Current Outpatient Medications  Medication Sig Dispense Refill   acetaminophen (TYLENOL) 650 MG CR tablet Take 1,300 mg by mouth daily as needed for pain.     atorvastatin (LIPITOR) 10 MG tablet Take 10 mg by mouth daily.     diphenhydrAMINE (BENADRYL) 25 MG tablet  Take 25 mg by mouth at bedtime as needed.     furosemide (LASIX) 20 MG tablet Take by mouth.     gabapentin (NEURONTIN) 400 MG capsule Take 800 mg by mouth daily at 2 am.     guaiFENesin-dextromethorphan (ROBITUSSIN DM) 100-10 MG/5ML syrup Take 5 mLs by mouth every 4 (four) hours as needed for cough. 236 mL 0   melatonin 5 MG TABS Take 5 mg by mouth at bedtime as needed (sleep).     methocarbamol (ROBAXIN) 500 MG tablet Take 1 tablet (500 mg total) by mouth every 8 (eight) hours as needed for muscle spasms. 30 tablet 0   Multiple Vitamins-Minerals (MULTIVITAMIN WITH MINERALS) tablet Take 1 tablet by mouth daily.     ondansetron (ZOFRAN-ODT) 4 MG disintegrating tablet Take by mouth.     oxyCODONE-acetaminophen (PERCOCET/ROXICET) 5-325 MG tablet Take 1 tablet by mouth every 6 (six) hours as needed for severe pain. 14 tablet 0   oxymetazoline (AFRIN) 0.05 % nasal spray Place 1 spray into both nostrils 2 (two) times daily as needed.     sertraline (ZOLOFT) 50 MG tablet Take 50  mg by mouth daily.     spironolactone (ALDACTONE) 100 MG tablet Take by mouth.     TRULICITY 4.5 LY/6.5KP SOPN Inject 4.5 mg into the skin every Wednesday.     No current facility-administered medications for this visit.    REVIEW OF SYSTEMS:   Constitutional: Denies fevers, chills or abnormal night sweats Eyes: Denies blurriness of vision, double vision or watery eyes Ears, nose, mouth, throat, and face: Denies mucositis or sore throat Respiratory: Denies cough, dyspnea or wheezes Cardiovascular: Denies palpitation, chest discomfort but has moderate bilateral lower extremity edema Gastrointestinal:  Denies nausea, heartburn or change in bowel habits Skin: Denies abnormal skin rashes Lymphatics: Denies new lymphadenopathy  Neurological:Denies numbness, tingling or new weaknesses Behavioral/Psych: Mood is stable, no new changes  All other systems were reviewed with the patient and are negative.  PHYSICAL EXAMINATION: ECOG PERFORMANCE STATUS: 1 - Symptomatic but completely ambulatory  There were no vitals filed for this visit. There were no vitals filed for this visit.  GENERAL:alert, no distress and comfortable.  She looks pale SKIN: Noted skin bruises EYES: normal, conjunctiva are pink and non-injected, sclera clear OROPHARYNX:no exudate, no erythema and lips, buccal mucosa, and tongue normal  NECK: supple, thyroid normal size, non-tender, without nodularity LYMPH:  no palpable lymphadenopathy in the cervical, axillary or inguinal LUNGS: clear to auscultation and percussion with normal breathing effort HEART: regular rate & rhythm and no murmurs with moderate bilateral lower extremity edema ABDOMEN:abdomen soft, distended with ascites Musculoskeletal:no cyanosis of digits and no clubbing  PSYCH: alert & oriented x 3 with fluent speech NEURO: no focal motor/sensory deficits.  Resting tremors noted  RADIOGRAPHIC STUDIES: I have reviewed her prior CT imaging I have personally  reviewed the radiological images as listed and agreed with the findings in the report. US Paracentesis  Result Date: 02/11/2021 INDICATION: Cirrhosis with recurrent ascites. Request for therapeutic and diagnostic paracentesis. EXAM: ULTRASOUND GUIDED  PARACENTESIS MEDICATIONS: 10 mL 1% lidocaine COMPLICATIONS: None immediate. PROCEDURE: Informed written consent was obtained from the patient after a discussion of the risks, benefits and alternatives to treatment. A timeout was performed prior to the initiation of the procedure. Initial ultrasound scanning demonstrates a large amount of ascites within the right lower abdominal quadrant. The right lower abdomen was prepped and draped in the usual sterile fashion. 1% lidocaine was used for local anesthesia.  Following this, a 6 Fr Safe-T-Centesis catheter was introduced. An ultrasound image was saved for documentation purposes. The paracentesis was performed. The catheter was removed and a dressing was applied. The patient tolerated the procedure well without immediate post procedural complication. Patient received post-procedure intravenous albumin; see nursing notes for details. FINDINGS: A total of approximately 8.9 L of clear yellow fluid was removed. Samples were sent to the laboratory as requested by the clinical team. IMPRESSION: Successful ultrasound-guided paracentesis yielding 8.9 liters of peritoneal fluid. Read by: Durenda Guthrie, PA-C Electronically Signed   By: Aletta Edouard M.D.   On: 02/11/2021 14:37    ASSESSMENT & PLAN:  Iron deficiency anemia due to chronic blood loss This is a high risk patient She is not able to tolerate oral iron supplement and has been ineffective I do not recommend her to take oral iron supplement as it can exacerbate her abdominal symptoms and make it hard to check if she indeed have GI bleed The most likely cause of her anemia is due to chronic blood loss/malabsorption syndrome. We discussed some of the risks, benefits,  and alternatives of intravenous iron infusions. The patient is symptomatic from anemia and the iron level is critically low. She tolerated oral iron supplement poorly and desires to achieved higher levels of iron faster for adequate hematopoesis. Some of the side-effects to be expected including risks of infusion reactions, phlebitis, headaches, nausea and fatigue.  The patient is willing to proceed. Patient education material was dispensed.  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 and signs of liver cirrhosis I would defer to her gastroenterologist for further evaluation and management She has signs of portal hypertension on EGD She is aware that she should avoid NSAID and aspirin containing medications indefinitely  Grade IV internal hemorrhoids She denies recent significant hemorrhoidal bleeding  History of uterine cancer Her last CT imaging of her abdomen and pelvis show no evidence of cancer recurrence  Thalassemia minor According to the patient, she has thalassemia Her highest hemoglobin is around 9-10  Orders Placed This Encounter  Procedures   Ferritin    Standing Status:   Standing    Number of Occurrences:   3    Standing Expiration Date:   02/13/2022   Iron and TIBC    Standing Status:   Standing    Number of Occurrences:   3    Standing Expiration Date:   02/13/2022   Vitamin B12    Standing Status:   Future    Number of Occurrences:   1    Standing Expiration Date:   02/13/2022   Sedimentation rate    Standing Status:   Future    Number of Occurrences:   1    Standing Expiration Date:   02/13/2022   Reticulocytes    Standing Status:   Future     Number of Occurrences:   1    Standing Expiration Date:   02/13/2022   ABO/Rh    Standing Status:   Future    Standing Expiration Date:   02/13/2022   Sample to Blood Bank    Standing Status:   Standing    Number of Occurrences:  33    Standing Expiration Date:   02/13/2022    All questions were answered. The patient knows to call the clinic with any problems, questions or concerns.  The total time spent in the appointment was 60 minutes encounter with patients including review of chart and various tests results, discussions about plan of care and coordination of care plan  Heath Lark, MD 6/29/20222:57 PM       Heath Lark, MD 02/13/21 2:57 PM

## 2021-02-14 ENCOUNTER — Telehealth: Payer: Self-pay

## 2021-02-14 NOTE — Telephone Encounter (Signed)
Called and given below message. She verbalized understanding. 

## 2021-02-14 NOTE — Telephone Encounter (Signed)
-----   Message from Heath Lark, MD sent at 02/14/2021  9:01 AM EDT ----- Pls call her Iron studies are low Lab did not repeat CBC for some reason I recommend we proceed with IV iron as scheduled

## 2021-02-15 ENCOUNTER — Ambulatory Visit (HOSPITAL_COMMUNITY): Payer: Managed Care, Other (non HMO) | Attending: Internal Medicine

## 2021-02-15 ENCOUNTER — Other Ambulatory Visit (HOSPITAL_COMMUNITY): Payer: Self-pay

## 2021-02-15 ENCOUNTER — Other Ambulatory Visit: Payer: Self-pay

## 2021-02-15 DIAGNOSIS — R2689 Other abnormalities of gait and mobility: Secondary | ICD-10-CM | POA: Diagnosis present

## 2021-02-15 DIAGNOSIS — M6281 Muscle weakness (generalized): Secondary | ICD-10-CM | POA: Diagnosis present

## 2021-02-15 DIAGNOSIS — R262 Difficulty in walking, not elsewhere classified: Secondary | ICD-10-CM

## 2021-02-15 DIAGNOSIS — D500 Iron deficiency anemia secondary to blood loss (chronic): Secondary | ICD-10-CM

## 2021-02-15 NOTE — Therapy (Signed)
[place], [place], [address] Phone: [phone]   Fax:  [phone]  Physical Therapy Treatment  Patient Details  Name: Rachel Huber MRN: [id number] Date of Birth: 01/29/1956 Referring Provider (PT): Mortimer Fries PA-C   Encounter Date: 02/15/2021   PT End of Session - 02/15/21 1044     Visit Number 3    Number of Visits 12    Date for PT Re-Evaluation 03/18/21    Authorization Type [employer]    Authorization Time Period VL based on medical necessity, 60 total    Authorization - Visit Number 6    Authorization - Number of Visits 60    Progress Note Due on Visit 10    PT Start Time 1035    PT Stop Time 1115    PT Time Calculation (min) 40 min    Activity Tolerance Patient tolerated treatment well    Behavior During Therapy WFL for tasks assessed/performed             Past Medical History:  Diagnosis Date   Anxiety    Arthritis    Cirrhosis of liver ([place])    Diabetes mellitus without complication ([place])    Dyspnea    DOE   GERD (gastroesophageal reflux disease)    Hepatitis    PAST   Hypertension    Neuropathy    Neuropathy, diabetic ([place])    Pneumonia    Sinus complaint    Sleep apnea    CPAP   Thalassemia minor 1992    Past Surgical History:  Procedure Laterality Date   ABDOMINAL HYSTERECTOMY     BREAST BIOPSY Right 02/15/2018   [place] bx 6-6:30 ribbon shape, ONE CORE FRAGMENT WITH FIBROSIS. ONE CORE FRAGMENT WITH PORTION OF A DILATED   BREAST BIOPSY Right 02/15/2018   [place] bx 9:00 heart shape, USUAL DUCTAL HYPERPLASIA   BREAST LUMPECTOMY Right 03/09/2018   Procedure: BREAST LUMPECTOMY x 2;  Surgeon: Benjamine Sprague, DO;  Location: [facility] ORS;  Service: General;  Laterality: Right;   CATARACT EXTRACTION W/PHACO Left 06/11/2016   Procedure: CATARACT EXTRACTION PHACO AND INTRAOCULAR LENS PLACEMENT (IOC);  Surgeon: Estill Cotta, MD;  Location: [facility];  Service: Ophthalmology;  Laterality: Left;   Lot # [id number] H US:01:38.6 AP%:26.4 CDE:44.15   CATARACT EXTRACTION W/PHACO Right 06/15/2018   Procedure: CATARACT EXTRACTION PHACO AND INTRAOCULAR LENS PLACEMENT (IOC);  Surgeon: Birder Robson, MD;  Location: [facility];  Service: Ophthalmology;  Laterality: Right;  [place] 00:38.2 CDE 4.23 Fluid Pack Lot # [id number]   COLONOSCOPIES     COLONOSCOPY WITH PROPOFOL N/A 10/10/2020   Procedure: COLONOSCOPY WITH PROPOFOL;  Surgeon: Lesly Rubenstein, MD;  Location: [facility] END[facility]COPY;  Service: Endoscopy;  Laterality: N/A;   COLONOSCOPY WITH PROPOFOL N/A 11/20/2020   Procedure: COLONOSCOPY WITH PROPOFOL;  Surgeon: Lesly Rubenstein, MD;  Location: [facility] ENDOSCOPY;  Service: Endoscopy;  Laterality: N/A;  DM STAT CBC, BMP COVID POSITIVE 09/02/2020   CTR     ESOPHAGOGASTRODUODENOSCOPY (EGD) WITH PROPOFOL N/A 10/10/2020   Procedure: ESOPHAGOGASTRODUODENOSCOPY (EGD) WITH PROPOFOL;  Surgeon: Lesly Rubenstein, MD;  Location: [facility] ENDOSCOPY;  Service: Endoscopy;  Laterality: N/A;  COVID POSITIVE 10/08/2020   JOINT REPLACEMENT     KNEE SURGERY Right 09/24/2017   plates and pins   TOTAL SHOULDER ARTHROPLASTY Right 09/27/2015    There were no vitals filed for this visit.   Subjective Assessment - 02/15/21 1047     Subjective Not much pain but feeling weak  Currently in Pain? No/denies    Pain Score 0-No pain                [facility] PT Assessment - 02/15/21 0001       Assessment   Medical Diagnosis R hand pain, Weakness, Cirrhosis of liver, weakness/balance    Referring Provider (PT) Mortimer Fries PA-C    Onset Date/Surgical Date 09/01/20                           [facility] Adult PT Treatment/Exercise - 02/15/21 0001       Knee/Hip Exercises: Standing   Gait Training standing heel-toe rocking with towel roll under midfoot for balance correction x 2 min    Other Standing Knee Exercises sidestepping x 2 min 5 lbs    Other Standing Knee Exercises stair taps x 2 min 5 lbs       Knee/Hip Exercises: Seated   Long Arc Quad Strengthening;Both;2 sets;10 reps    Long Arc Quad Weight 5 lbs.    Other Seated Knee/Hip Exercises x 2 min with towel roll under midfoot for fulcrum    Sit to Sand Other (comment);without UE support   24" seat height 2x5, 20" seat heigh 2x5                     PT Short Term Goals - 02/04/21 1611       PT SHORT TERM GOAL #1   Title Patient will be independent with HEP in order to improve functional outcomes.    Time 3    Period Weeks    Status New    Target Date 02/25/21      PT SHORT TERM GOAL #2   Title Patient will report at least 25% improvement in symptoms for improved quality of life.    Time 3    Period Weeks    Status New    Target Date 02/25/21      PT SHORT TERM GOAL #3   Title Demo improved gait tolerance as evidenced by distance of 150 ft during 2MWT    Time 3    Period Weeks    Status New    Target Date 02/25/21               PT Long Term Goals - 02/04/21 1612       PT LONG TERM GOAL #1   Title Patient will report at least 75% improvement in symptoms for improved quality of life.    Time 6    Period Weeks    Status New    Target Date 03/18/21      PT LONG TERM GOAL #2   Title Patient will be able to ambulate at least 226 feet in 2MWT with LRAD in order to demonstrate improved gait speed for community ambulation.    Time 6    Period Weeks    Status New    Target Date 03/18/21      PT LONG TERM GOAL #3   Title Pt will be able to perform 5xSTS in 15 sec or < with no UE support and proper mechanics to demo improved balance and functional BLE strength.    Time 6    Period Weeks    Status New    Target Date 03/18/21                   Plan - 02/15/21 1057  Clinical Impression Statement Pt tolerating tx session well requiring frequent therapeutic rest periods due to fatigue but demonstrating improve tolerance as evidenced by increase in resistance and repetition without adverse  effects.  Continued PT sessions indicated to progress strength and activity tolerance to improve functional mobility.    Personal Factors and Comorbidities Comorbidity 1;Time since onset of injury/illness/exacerbation;Past/Current Experience;Comorbidity 2;Fitness    Comorbidities PMH and hx of mutiple physical trauma, Cirrhosis    Examination-Activity Limitations Bathing;Bed Mobility;Bend;Lift;Toileting;Stand;Squat;Locomotion Level;Transfers    Examination-Participation Restrictions Cleaning;Community Activity;Driving;Shop;Occupation;Meal Prep;Yard Work;Volunteer    Stability/Clinical Decision Making Stable/Uncomplicated    Rehab Potential Good    PT Frequency 2x / week    PT Duration 6 weeks    PT Treatment/Interventions ADLs/Self Care Home Management;Aquatic Therapy;Biofeedback;Cryotherapy;Electrical Stimulation;DME Instruction;Ultrasound;Moist Heat;Gait training;Stair training;Functional mobility training;Therapeutic activities;Therapeutic exercise;Balance training;Patient/family education;Neuromuscular re-education;Manual techniques;Taping;Energy conservation;Dry needling;Spinal Manipulations;Joint Manipulations;Compression bandaging;Manual lymph drainage    PT Next Visit Plan LE strengthening, Functional strengthening, gait/balance training, begin UE strengthening as well    PT Home Exercise Plan marching, LAQ, ankle pumps 6/28 sit/stands    Consulted and Agree with Plan of Care Patient             Patient will benefit from skilled therapeutic intervention in order to improve the following deficits and impairments:  Abnormal gait, Decreased activity tolerance, Decreased balance, Decreased mobility, Decreased knowledge of use of DME, Decreased endurance, Decreased range of motion, Decreased strength, Difficulty walking, Impaired perceived functional ability, Improper body mechanics, Pain  Visit Diagnosis: Muscle weakness (generalized)  Difficulty in walking, not elsewhere  classified  Other abnormalities of gait and mobility     Problem List Patient Active Problem List   Diagnosis Date Noted   Iron deficiency anemia due to chronic blood loss 02/13/2021   History of uterine cancer 02/13/2021   Decompensated hepatic cirrhosis ([place]) 10/25/2020   OSA (obstructive sleep apnea) 10/25/2020   Glossitis 10/25/2020   Grade IV internal hemorrhoids    Acute blood loss anemia 10/08/2020   Hyponatremia 10/08/2020   Rectal bleeding 10/08/2020   Acute respiratory failure with hypoxia ([place]) 09/10/2020   Pneumonia due to COVID-19 virus 09/08/2020   Hip fracture, right ([place]) 09/04/2020   Closed right hip fracture ([place]) 09/02/2020   Diabetes mellitus with hyperglycemia ([place]) 09/02/2020   Hypertension    Thalassemia minor    Pancytopenia ([place])    HLD (hyperlipidemia)    Acute renal failure superimposed on stage 3a chronic kidney disease ([place])    Depression    Fall at home, initial encounter    Nondisplaced fracture of greater trochanter of right femur, initial encounter for closed fracture ([place])    11:16 AM, 02/15/21 M. Sherlyn Lees, PT, DPT Physical Therapist- [place] Office Number: [phone]   [place] [address] [place], [place], [address] Phone: [phone]   Fax:  [phone]  Name: TOYA PALACIOS MRN: [id number] Date of Birth: [birth year]

## 2021-02-17 LAB — AEROBIC/ANAEROBIC CULTURE W GRAM STAIN (SURGICAL/DEEP WOUND)
Culture: NO GROWTH
Gram Stain: NONE SEEN

## 2021-02-21 ENCOUNTER — Encounter (HOSPITAL_COMMUNITY): Payer: Self-pay

## 2021-02-21 ENCOUNTER — Ambulatory Visit (HOSPITAL_COMMUNITY): Payer: Managed Care, Other (non HMO)

## 2021-02-21 ENCOUNTER — Other Ambulatory Visit: Payer: Self-pay

## 2021-02-21 DIAGNOSIS — R262 Difficulty in walking, not elsewhere classified: Secondary | ICD-10-CM

## 2021-02-21 DIAGNOSIS — M6281 Muscle weakness (generalized): Secondary | ICD-10-CM

## 2021-02-21 DIAGNOSIS — R2689 Other abnormalities of gait and mobility: Secondary | ICD-10-CM

## 2021-02-21 NOTE — Therapy (Signed)
[place], [place], [address] Phone: [phone]   Fax:  [phone]  Physical Therapy Treatment  Patient Details  Name: Rachel Huber MRN: [id number] Date of Birth: 11-28-1955 Referring Provider (PT): Mortimer Fries PA-C   Encounter Date: 02/21/2021   PT End of Session - 02/21/21 1501     Visit Number 4    Number of Visits 12    Date for PT Re-Evaluation 03/18/21    Authorization Type [employer]    Authorization Time Period VL based on medical necessity, 60 total    Authorization - Visit Number 7    Authorization - Number of Visits 60    Progress Note Due on Visit 10    PT Start Time 1455   restroom break at beginning of session   PT Stop Time 1535    PT Time Calculation (min) 40 min    Equipment Utilized During Treatment Gait belt    Activity Tolerance Patient tolerated treatment well;Patient limited by fatigue    Behavior During Therapy WFL for tasks assessed/performed             Past Medical History:  Diagnosis Date   Anxiety    Arthritis    Cirrhosis of liver ([place])    Diabetes mellitus without complication ([place])    Dyspnea    DOE   GERD (gastroesophageal reflux disease)    Hepatitis    PAST   Hypertension    Neuropathy    Neuropathy, diabetic ([place])    Pneumonia    Sinus complaint    Sleep apnea    CPAP   Thalassemia minor 1992    Past Surgical History:  Procedure Laterality Date   ABDOMINAL HYSTERECTOMY     BREAST BIOPSY Right 02/15/2018   [place] bx 6-6:30 ribbon shape, ONE CORE FRAGMENT WITH FIBROSIS. ONE CORE FRAGMENT WITH PORTION OF A DILATED   BREAST BIOPSY Right 02/15/2018   [place] bx 9:00 heart shape, USUAL DUCTAL HYPERPLASIA   BREAST LUMPECTOMY Right 03/09/2018   Procedure: BREAST LUMPECTOMY x 2;  Surgeon: Benjamine Sprague, DO;  Location: [facility];  Service: General;  Laterality: Right;   CATARACT EXTRACTION W/PHACO Left 06/11/2016   Procedure: CATARACT EXTRACTION PHACO AND INTRAOCULAR LENS  PLACEMENT (IOC);  Surgeon: Estill Cotta, MD;  Location: [facility];  Service: Ophthalmology;  Laterality: Left;  Lot # [id number] H US:01:38.6 AP%:26.4 CDE:44.15   CATARACT EXTRACTION W/PHACO Right 06/15/2018   Procedure: CATARACT EXTRACTION PHACO AND INTRAOCULAR LENS PLACEMENT (IOC);  Surgeon: Birder Robson, MD;  Location: [facility];  Service: Ophthalmology;  Laterality: Right;  [place] 00:38.2 CDE 4.23 Fluid Pack Lot # [id number]   COLONOSCOPIES     COLONOSCOPY WITH PROPOFOL N/A 10/10/2020   Procedure: COLONOSCOPY WITH PROPOFOL;  Surgeon: Lesly Rubenstein, MD;  Location: [facility] ENDOSCOPY;  Service: Endoscopy;  Laterality: N/A;   COLONOSCOPY WITH PROPOFOL N/A 11/20/2020   Procedure: COLONOSCOPY WITH PROPOFOL;  Surgeon: Lesly Rubenstein, MD;  Location: [facility] ENDOSCOPY;  Service: Endoscopy;  Laterality: N/A;  DM STAT CBC, BMP COVID POSITIVE 09/02/2020   CTR     ESOPHAGOGASTRODUODENOSCOPY (EGD) WITH PROPOFOL N/A 10/10/2020   Procedure: ESOPHAGOGASTRODUODENOSCOPY (EGD) WITH PROPOFOL;  Surgeon: Lesly Rubenstein, MD;  Location: [facility] ENDOSCOPY;  Service: Endoscopy;  Laterality: N/A;  COVID POSITIVE 10/08/2020   JOINT REPLACEMENT     KNEE SURGERY Right 09/24/2017   plates and pins   TOTAL SHOULDER ARTHROPLASTY Right 09/27/2015    There were no vitals filed for this  visit.   Subjective Assessment - 02/21/21 1500     Subjective Feels she is getting stronger, increased ease standing from toilet.  No reports of pain for last 3 weeks.    Patient Stated Goals improve walking    Currently in Pain? No/denies                               [facility] Adult PT Treatment/Exercise - 02/21/21 0001       Ambulation/Gait   Ambulation/Gait Yes    Ambulation/Gait Assistance 5: Supervision;4: Min guard    Ambulation Distance (Feet) 122 Feet    Assistive device Rolling walker    Gait Pattern Trunk flexed    Gait Comments 2MWT      Knee/Hip Exercises: Standing   Heel Raises 10 reps     Heel Raises Limitations toe raises with HHA    Hip Abduction 10 reps;Knee straight    Abduction Limitations HHA    Functional Squat 10 reps    Functional Squat Limitations minisquats wiht chair behind and cueing for mechanics    Gait Training 2MWT 186f with RW    Other Standing Knee Exercises sidestepping x 2 min 5 lbs    Other Standing Knee Exercises toe tapping 2sets x 10 reps alternating 6in step height      Knee/Hip Exercises: Seated   Sit to Sand 5 reps;without UE support;2 sets   elevated height to 24in                     PT Short Term Goals - 02/04/21 1611       PT SHORT TERM GOAL #1   Title Patient will be independent with HEP in order to improve functional outcomes.    Time 3    Period Weeks    Status New    Target Date 02/25/21      PT SHORT TERM GOAL #2   Title Patient will report at least 25% improvement in symptoms for improved quality of life.    Time 3    Period Weeks    Status New    Target Date 02/25/21      PT SHORT TERM GOAL #3   Title Demo improved gait tolerance as evidenced by distance of 150 ft during 2MWT    Time 3    Period Weeks    Status New    Target Date 02/25/21               PT Long Term Goals - 02/04/21 1612       PT LONG TERM GOAL #1   Title Patient will report at least 75% improvement in symptoms for improved quality of life.    Time 6    Period Weeks    Status New    Target Date 03/18/21      PT LONG TERM GOAL #2   Title Patient will be able to ambulate at least 226 feet in 2MWT with LRAD in order to demonstrate improved gait speed for community ambulation.    Time 6    Period Weeks    Status New    Target Date 03/18/21      PT LONG TERM GOAL #3   Title Pt will be able to perform 5xSTS in 15 sec or < with no UE support and proper mechanics to demo improved balance and functional BLE strength.    Time 6  Period Weeks    Status New    Target Date 03/18/21                   Plan - 02/21/21  1550     Clinical Impression Statement Pt progressing well towards goals.  2MWT with increased distance and stable gait with RW.  Added standing hip strengthening exercises to assist with functional tasks.  Pt required periodic seated rest breaks due to fatigue.  No reports of pain through session.  Encouraged pt to begin walking program to improve activity tolerance, started at 2-3 minutes daily.    Personal Factors and Comorbidities Comorbidity 1;Time since onset of injury/illness/exacerbation;Past/Current Experience;Comorbidity 2;Fitness    Comorbidities PMH and hx of mutiple physical trauma, Cirrhosis    Examination-Activity Limitations Bathing;Bed Mobility;Bend;Lift;Toileting;Stand;Squat;Locomotion Level;Transfers    Examination-Participation Restrictions Cleaning;Community Activity;Driving;Shop;Occupation;Meal Prep;Yard Work;Volunteer    Stability/Clinical Decision Making Stable/Uncomplicated    Clinical Decision Making Low    Rehab Potential Good    PT Frequency 2x / week    PT Duration 6 weeks    PT Treatment/Interventions ADLs/Self Care Home Management;Aquatic Therapy;Biofeedback;Cryotherapy;Electrical Stimulation;DME Instruction;Ultrasound;Moist Heat;Gait training;Stair training;Functional mobility training;Therapeutic activities;Therapeutic exercise;Balance training;Patient/family education;Neuromuscular re-education;Manual techniques;Taping;Energy conservation;Dry needling;Spinal Manipulations;Joint Manipulations;Compression bandaging;Manual lymph drainage    PT Next Visit Plan LE strengthening, Functional strengthening, gait/balance training, begin UE strengthening as well    PT Home Exercise Plan marching, LAQ, ankle pumps 6/28 sit/stands; 02/21/21: begin walking program    Consulted and Agree with Plan of Care Patient             Patient will benefit from skilled therapeutic intervention in order to improve the following deficits and impairments:  Abnormal gait, Decreased activity  tolerance, Decreased balance, Decreased mobility, Decreased knowledge of use of DME, Decreased endurance, Decreased range of motion, Decreased strength, Difficulty walking, Impaired perceived functional ability, Improper body mechanics, Pain  Visit Diagnosis: Other abnormalities of gait and mobility  Difficulty in walking, not elsewhere classified  Muscle weakness (generalized)     Problem List Patient Active Problem List   Diagnosis Date Noted   Iron deficiency anemia due to chronic blood loss 02/13/2021   History of uterine cancer 02/13/2021   Decompensated hepatic cirrhosis ([place]) 10/25/2020   OSA (obstructive sleep apnea) 10/25/2020   Glossitis 10/25/2020   Grade IV internal hemorrhoids    Acute blood loss anemia 10/08/2020   Hyponatremia 10/08/2020   Rectal bleeding 10/08/2020   Acute respiratory failure with hypoxia ([place]) 09/10/2020   Pneumonia due to COVID-19 virus 09/08/2020   Hip fracture, right ([place]) 09/04/2020   Closed right hip fracture ([place]) 09/02/2020   Diabetes mellitus with hyperglycemia ([place]) 09/02/2020   Hypertension    Thalassemia minor    Pancytopenia ([place])    HLD (hyperlipidemia)    Acute renal failure superimposed on stage 3a chronic kidney disease ([place])    Depression    Fall at home, initial encounter    Nondisplaced fracture of greater trochanter of right femur, initial encounter for closed fracture [facility])    Ihor Austin, LPTA/CLT; CBIS [phone]  Aldona Lento 02/21/2021, 3:56 PM  [place], [place], [address] Phone: [phone]   Fax:  [phone]  Name: Rachel Huber MRN: [id number] Date of Birth: Jul 21, 1956

## 2021-02-22 ENCOUNTER — Inpatient Hospital Stay (HOSPITAL_COMMUNITY): Payer: Managed Care, Other (non HMO) | Attending: Hematology and Oncology

## 2021-02-22 ENCOUNTER — Encounter (HOSPITAL_COMMUNITY): Payer: Self-pay

## 2021-02-22 VITALS — BP 110/54 | HR 81 | Temp 97.0°F | Resp 18

## 2021-02-22 DIAGNOSIS — D500 Iron deficiency anemia secondary to blood loss (chronic): Secondary | ICD-10-CM

## 2021-02-22 DIAGNOSIS — D509 Iron deficiency anemia, unspecified: Secondary | ICD-10-CM | POA: Diagnosis not present

## 2021-02-22 MED ORDER — ACETAMINOPHEN 325 MG PO TABS
650.0000 mg | ORAL_TABLET | Freq: Once | ORAL | Status: AC
  Administered 2021-02-22: 650 mg via ORAL
  Filled 2021-02-22: qty 2

## 2021-02-22 MED ORDER — LORATADINE 10 MG PO TABS
10.0000 mg | ORAL_TABLET | Freq: Once | ORAL | Status: AC
Start: 2021-02-22 — End: 2021-02-22
  Administered 2021-02-22: 10 mg via ORAL
  Filled 2021-02-22: qty 1

## 2021-02-22 MED ORDER — SODIUM CHLORIDE 0.9 % IV SOLN: Freq: Once | INTRAVENOUS | Status: AC

## 2021-02-22 MED ORDER — SODIUM CHLORIDE 0.9 % IV SOLN
300.0000 mg | Freq: Once | INTRAVENOUS | Status: AC
  Administered 2021-02-22: 300 mg via INTRAVENOUS
  Filled 2021-02-22: qty 300

## 2021-02-22 MED ORDER — LORATADINE 10 MG PO TABS: 10.0000 mg | ORAL_TABLET | Freq: Every day | ORAL | Status: DC

## 2021-02-22 NOTE — Patient Instructions (Signed)
[facility]  Discharge Instructions: Thank you for choosing [facility] to provide your oncology and hematology care.  If you have a lab appointment with the [facility], please come in thru the Main Entrance and check in at the main information desk.  Wear comfortable clothing and clothing appropriate for easy access to any Portacath or PICC line.   We strive to give you quality time with your provider. You may need to reschedule your appointment if you arrive late (15 or more minutes).  Arriving late affects you and other patients whose appointments are after yours.  Also, if you miss three or more appointments without notifying the office, you may be dismissed from the clinic at the provider's discretion.      For prescription refill requests, have your pharmacy contact our office and allow 72 hours for refills to be completed.    Today you received the following: Venofer   To help prevent nausea and vomiting after your treatment, we encourage you to take your nausea medication as directed.  BELOW ARE SYMPTOMS THAT SHOULD BE REPORTED IMMEDIATELY: *FEVER GREATER THAN 100.4 F (38 C) OR HIGHER *CHILLS OR SWEATING *NAUSEA AND VOMITING THAT IS NOT CONTROLLED WITH YOUR NAUSEA MEDICATION *UNUSUAL SHORTNESS OF BREATH *UNUSUAL BRUISING OR BLEEDING *URINARY PROBLEMS (pain or burning when urinating, or frequent urination) *BOWEL PROBLEMS (unusual diarrhea, constipation, pain near the anus) TENDERNESS IN MOUTH AND THROAT WITH OR WITHOUT PRESENCE OF ULCERS (sore throat, sores in mouth, or a toothache) UNUSUAL RASH, SWELLING OR PAIN  UNUSUAL VAGINAL DISCHARGE OR ITCHING   Items with * indicate a potential emergency and should be followed up as soon as possible or go to the Emergency Department if any problems should occur.  Please show the CHEMOTHERAPY ALERT CARD or IMMUNOTHERAPY ALERT CARD at check-in to the Emergency Department and triage nurse.  Should you have  questions after your visit or need to cancel or reschedule your appointment, please contact [facility] [phone]  and follow the prompts.  Office hours are 8:00 a.m. to 4:30 p.m. Monday - Friday. Please note that voicemails left after 4:00 p.m. may not be returned until the following business day.  We are closed weekends and major holidays. You have access to a nurse at all times for urgent questions. Please call the main number to the clinic [phone] and follow the prompts.  For any non-urgent questions, you may also contact your provider using MyChart. We now offer e-Visits for anyone 11 and older to request care online for non-urgent symptoms. For details visit [email or web address].   Also download the MyChart app! Go to the app store, search "MyChart", open the app, select [facility], and log in with your MyChart username and password.  Due to Covid, a mask is required upon entering the hospital/clinic. If you do not have a mask, one will be given to you upon arrival. For doctor visits, patients may have 1 support person aged 58 or older with them. For treatment visits, patients cannot have anyone with them due to current Covid guidelines and our immunocompromised population.

## 2021-02-22 NOTE — Progress Notes (Signed)
Patient tolerated iron infusion with no complaints voiced. Peripheral IV site clean and dry with good blood return noted before and after infusion. Band aid applied. VSS with discharge and left in satisfactory condition with no s/s of distress noted.

## 2021-02-25 ENCOUNTER — Other Ambulatory Visit: Payer: Self-pay

## 2021-02-25 ENCOUNTER — Encounter (HOSPITAL_COMMUNITY): Payer: Self-pay

## 2021-02-25 ENCOUNTER — Ambulatory Visit (HOSPITAL_COMMUNITY): Payer: Managed Care, Other (non HMO)

## 2021-02-25 DIAGNOSIS — R262 Difficulty in walking, not elsewhere classified: Secondary | ICD-10-CM

## 2021-02-25 DIAGNOSIS — M6281 Muscle weakness (generalized): Secondary | ICD-10-CM

## 2021-02-25 DIAGNOSIS — R2689 Other abnormalities of gait and mobility: Secondary | ICD-10-CM

## 2021-02-25 NOTE — Therapy (Signed)
[facility] [place], [place], [address] Phone: [phone]   Fax:  [phone]  Physical Therapy Treatment  Patient Details  Name: Rachel Huber MRN: [id number] Date of Birth: 1956-08-05 Referring Provider (PT): Mortimer Fries PA-C   Encounter Date: 02/25/2021   PT End of Session - 02/25/21 1453     Visit Number 5    Number of Visits 12    Date for PT Re-Evaluation 03/18/21    Authorization Type [employer]    Authorization Time Period VL based on medical necessity, 60 total    Authorization - Visit Number 8    Authorization - Number of Visits 60    Progress Note Due on Visit 10    PT Start Time 1447    PT Stop Time 1532    PT Time Calculation (min) 45 min    Equipment Utilized During Treatment Gait belt    Activity Tolerance Patient tolerated treatment well;Patient limited by fatigue    Behavior During Therapy WFL for tasks assessed/performed             Past Medical History:  Diagnosis Date   Anxiety    Arthritis    Cirrhosis of liver ([place])    Diabetes mellitus without complication ([place])    Dyspnea    DOE   GERD (gastroesophageal reflux disease)    Hepatitis    PAST   Hypertension    Neuropathy    Neuropathy, diabetic ([place])    Pneumonia    Sinus complaint    Sleep apnea    CPAP   Thalassemia minor 1992    Past Surgical History:  Procedure Laterality Date   ABDOMINAL HYSTERECTOMY     BREAST BIOPSY Right 02/15/2018   [place] bx 6-6:30 ribbon shape, ONE CORE FRAGMENT WITH FIBROSIS. ONE CORE FRAGMENT WITH PORTION OF A DILATED   BREAST BIOPSY Right 02/15/2018   [place] bx 9:00 heart shape, USUAL DUCTAL HYPERPLASIA   BREAST LUMPECTOMY Right 03/09/2018   Procedure: BREAST LUMPECTOMY x 2;  Surgeon: Benjamine Sprague, DO;  Location: [facility] ORS;  Service: General;  Laterality: Right;   CATARACT EXTRACTION W/PHACO Left 06/11/2016   Procedure: CATARACT EXTRACTION PHACO AND INTRAOCULAR LENS PLACEMENT (IOC);  Surgeon: Estill Cotta, MD;  Location: [facility] ORS;  Service: Ophthalmology;  Laterality: Left;  Lot # [id number] H US:01:38.6 AP%:26.4 CDE:44.15   CATARACT EXTRACTION W/PHACO Right 06/15/2018   Procedure: CATARACT EXTRACTION PHACO AND INTRAOCULAR LENS PLACEMENT (IOC);  Surgeon: Birder Robson, MD;  Location: [facility];  Service: Ophthalmology;  Laterality: Right;  [place] 00:38.2 CDE 4.23 Fluid Pack Lot # [id number]   COLONOSCOPIES     COLONOSCOPY WITH PROPOFOL N/A 10/10/2020   Procedure: COLONOSCOPY WITH PROPOFOL;  Surgeon: Lesly Rubenstein, MD;  Location: [facility] ENDOSCOPY;  Service: Endoscopy;  Laterality: N/A;   COLONOSCOPY WITH PROPOFOL N/A 11/20/2020   Procedure: COLONOSCOPY WITH PROPOFOL;  Surgeon: Lesly Rubenstein, MD;  Location: [facility] ENDOSCOPY;  Service: Endoscopy;  Laterality: N/A;  DM STAT CBC, BMP COVID POSITIVE 09/02/2020   CTR     ESOPHAGOGASTRODUODENOSCOPY (EGD) WITH PROPOFOL N/A 10/10/2020   Procedure: ESOPHAGOGASTRODUODENOSCOPY (EGD) WITH PROPOFOL;  Surgeon: Lesly Rubenstein, MD;  Location: [facility] ENDOSCOPY;  Service: Endoscopy;  Laterality: N/A;  COVID POSITIVE 10/08/2020   JOINT REPLACEMENT     KNEE SURGERY Right 09/24/2017   plates and pins   TOTAL SHOULDER ARTHROPLASTY Right 09/27/2015    There were no vitals filed for this visit.   Subjective Assessment - 02/25/21  33     Subjective Pt had blood transfusion on 02/22/21 and notes no adverse reactions to this    Patient Stated Goals improve walking                [facility] PT Assessment - 02/25/21 0001       Assessment   Medical Diagnosis R hand pain, Weakness, Cirrhosis of liver, weakness/balance    Referring Provider (PT) Mortimer Fries PA-C    Onset Date/Surgical Date 09/01/20                           [facility] Adult PT Treatment/Exercise - 02/25/21 0001       Knee/Hip Exercises: Aerobic   Nustep level 3 x 10.5 min      Knee/Hip Exercises: Standing   Gait Training Gait training w/ cane performed in // bars  for safety    Other Standing Knee Exercises toe tapping x 2 minalternating 6in step height with 5 lbs      Knee/Hip Exercises: Seated   Long Arc Quad Strengthening;Both;3 sets;10 reps    Long Arc Quad Weight 5 lbs.    Ball Squeeze 30x                      PT Short Term Goals - 02/04/21 1611       PT SHORT TERM GOAL #1   Title Patient will be independent with HEP in order to improve functional outcomes.    Time 3    Period Weeks    Status New    Target Date 02/25/21      PT SHORT TERM GOAL #2   Title Patient will report at least 25% improvement in symptoms for improved quality of life.    Time 3    Period Weeks    Status New    Target Date 02/25/21      PT SHORT TERM GOAL #3   Title Demo improved gait tolerance as evidenced by distance of 150 ft during 2MWT    Time 3    Period Weeks    Status New    Target Date 02/25/21               PT Long Term Goals - 02/04/21 1612       PT LONG TERM GOAL #1   Title Patient will report at least 75% improvement in symptoms for improved quality of life.    Time 6    Period Weeks    Status New    Target Date 03/18/21      PT LONG TERM GOAL #2   Title Patient will be able to ambulate at least 226 feet in 2MWT with LRAD in order to demonstrate improved gait speed for community ambulation.    Time 6    Period Weeks    Status New    Target Date 03/18/21      PT LONG TERM GOAL #3   Title Pt will be able to perform 5xSTS in 15 sec or < with no UE support and proper mechanics to demo improved balance and functional BLE strength.    Time 6    Period Weeks    Status New    Target Date 03/18/21                   Plan - 02/25/21 1532     Clinical Impression Statement Tolerated tx session well with onset of  fatigue when performint snanding activities 2-4 min onset requiring seated rest periods ad lib. Continued PT sessions indicated to improve strength and activity tolerance to progress to ambulation with  straight cane to enable greater level of mobility    Personal Factors and Comorbidities Comorbidity 1;Time since onset of injury/illness/exacerbation;Past/Current Experience;Comorbidity 2;Fitness    Comorbidities PMH and hx of mutiple physical trauma, Cirrhosis    Examination-Activity Limitations Bathing;Bed Mobility;Bend;Lift;Toileting;Stand;Squat;Locomotion Level;Transfers    Examination-Participation Restrictions Cleaning;Community Activity;Driving;Shop;Occupation;Meal Prep;Yard Work;Volunteer    Stability/Clinical Decision Making Stable/Uncomplicated    Rehab Potential Good    PT Frequency 2x / week    PT Duration 6 weeks    PT Treatment/Interventions ADLs/Self Care Home Management;Aquatic Therapy;Biofeedback;Cryotherapy;Electrical Stimulation;DME Instruction;Ultrasound;Moist Heat;Gait training;Stair training;Functional mobility training;Therapeutic activities;Therapeutic exercise;Balance training;Patient/family education;Neuromuscular re-education;Manual techniques;Taping;Energy conservation;Dry needling;Spinal Manipulations;Joint Manipulations;Compression bandaging;Manual lymph drainage    PT Next Visit Plan LE strengthening, Functional strengthening, gait/balance training, begin UE strengthening as well    PT Home Exercise Plan marching, LAQ, ankle pumps 6/28 sit/stands; 02/21/21: begin walking program    Consulted and Agree with Plan of Care Patient             Patient will benefit from skilled therapeutic intervention in order to improve the following deficits and impairments:  Abnormal gait, Decreased activity tolerance, Decreased balance, Decreased mobility, Decreased knowledge of use of DME, Decreased endurance, Decreased range of motion, Decreased strength, Difficulty walking, Impaired perceived functional ability, Improper body mechanics, Pain  Visit Diagnosis: Other abnormalities of gait and mobility  Difficulty in walking, not elsewhere classified  Muscle weakness  (generalized)     Problem List Patient Active Problem List   Diagnosis Date Noted   Iron deficiency anemia due to chronic blood loss 02/13/2021   History of uterine cancer 02/13/2021   Decompensated hepatic cirrhosis ([place]) 10/25/2020   OSA (obstructive sleep apnea) 10/25/2020   Glossitis 10/25/2020   Grade IV internal hemorrhoids    Acute blood loss anemia 10/08/2020   Hyponatremia 10/08/2020   Rectal bleeding 10/08/2020   Acute respiratory failure with hypoxia ([place]) 09/10/2020   Pneumonia due to COVID-19 virus 09/08/2020   Hip fracture, right ([place]) 09/04/2020   Closed right hip fracture ([place]) 09/02/2020   Diabetes mellitus with hyperglycemia ([place]) 09/02/2020   Hypertension    Thalassemia minor    Pancytopenia ([place])    HLD (hyperlipidemia)    Acute renal failure superimposed on stage 3a chronic kidney disease ([place])    Depression    Fall at home, initial encounter    Nondisplaced fracture of greater trochanter of right femur, initial encounter for closed fracture ([place])    3:34 PM, 02/25/21 M. Sherlyn Lees, PT, DPT Physical Therapist- [place] Office Number: [phone]   [facility] [address] [place], [place], [address] Phone: [phone]   Fax:  [phone]  Name: Rachel Huber MRN: [id number] Date of Birth: 1956/04/01

## 2021-02-27 ENCOUNTER — Ambulatory Visit (HOSPITAL_COMMUNITY): Payer: Managed Care, Other (non HMO)

## 2021-02-27 ENCOUNTER — Encounter (HOSPITAL_COMMUNITY): Payer: Self-pay

## 2021-02-27 ENCOUNTER — Other Ambulatory Visit: Payer: Self-pay

## 2021-02-27 DIAGNOSIS — M6281 Muscle weakness (generalized): Secondary | ICD-10-CM

## 2021-02-27 DIAGNOSIS — R262 Difficulty in walking, not elsewhere classified: Secondary | ICD-10-CM

## 2021-02-27 DIAGNOSIS — R2689 Other abnormalities of gait and mobility: Secondary | ICD-10-CM

## 2021-02-27 NOTE — Therapy (Signed)
[facility] [place], [place], [address] Phone: [phone]   Fax:  [phone]  Physical Therapy Treatment  Patient Details  Name: TIPHANY FAYSON MRN: [id number] Date of Birth: September 25, 1955 Referring Provider (PT): Mortimer Fries PA-C   Encounter Date: 02/27/2021   PT End of Session - 02/27/21 1452     Visit Number 6    Number of Visits 12    Date for PT Re-Evaluation 03/18/21    Authorization Type [employer]    Authorization Time Period VL based on medical necessity, 60 total    Authorization - Visit Number 9    Authorization - Number of Visits 60    Progress Note Due on Visit 10    PT Start Time 3532    PT Stop Time 1520    PT Time Calculation (min) 41 min    Equipment Utilized During Treatment Gait belt    Activity Tolerance Patient tolerated treatment well;Patient limited by fatigue    Behavior During Therapy WFL for tasks assessed/performed             Past Medical History:  Diagnosis Date   Anxiety    Arthritis    Cirrhosis of liver ([place])    Diabetes mellitus without complication ([place])    Dyspnea    DOE   GERD (gastroesophageal reflux disease)    Hepatitis    PAST   Hypertension    Neuropathy    Neuropathy, diabetic ([place])    Pneumonia    Sinus complaint    Sleep apnea    CPAP   Thalassemia minor 1992    Past Surgical History:  Procedure Laterality Date   ABDOMINAL HYSTERECTOMY     BREAST BIOPSY Right 02/15/2018   [place] bx 6-6:30 ribbon shape, ONE CORE FRAGMENT WITH FIBROSIS. ONE CORE FRAGMENT WITH PORTION OF A DILATED   BREAST BIOPSY Right 02/15/2018   [place] bx 9:00 heart shape, USUAL DUCTAL HYPERPLASIA   BREAST LUMPECTOMY Right 03/09/2018   Procedure: BREAST LUMPECTOMY x 2;  Surgeon: Benjamine Sprague, DO;  Location: [facility] ORS;  Service: General;  Laterality: Right;   CATARACT EXTRACTION W/PHACO Left 06/11/2016   Procedure: CATARACT EXTRACTION PHACO AND INTRAOCULAR LENS PLACEMENT (IOC);  Surgeon: Estill Cotta, MD;  Location: [facility]S;  Service: Ophthalmology;  Laterality: Left;  Lot # [id number] H US:01:38.6 AP%:26.4 CDE:44.15   CATARACT EXTRACTION W/PHACO Right 06/15/2018   Procedure: CATARACT EXTRACTION PHACO AND INTRAOCULAR LENS PLACEMENT (IOC);  Surgeon: Birder Robson, MD;  Location: [facility]S;  Service: Ophthalmology;  Laterality: Right;  [place] 00:38.2 CDE 4.23 Fluid Pack Lot # [id number]   COLONOSCOPIES     COLONOSCOPY WITH PROPOFOL N/A 10/10/2020   Procedure: COLONOSCOPY WITH PROPOFOL;  Surgeon: Lesly Rubenstein, MD;  Location: [facility] ENDOSCOPY;  Service: Endoscopy;  Laterality: N/A;   COLONOSCOPY WITH PROPOFOL N/A 11/20/2020   Procedure: COLONOSCOPY WITH PROPOFOL;  Surgeon: Lesly Rubenstein, MD;  Location: [facility] ENDOSCOPY;  Service: Endoscopy;  Laterality: N/A;  DM STAT CBC, BMP COVID POSITIVE 09/02/2020   CTR     ESOPHAGOGASTRODUODENOSCOPY (EGD) WITH PROPOFOL N/A 10/10/2020   Procedure: ESOPHAGOGASTRODUODENOSCOPY (EGD) WITH PROPOFOL;  Surgeon: Lesly Rubenstein, MD;  Location: [facility] ENDOSCOPY;  Service: Endoscopy;  Laterality: N/A;  COVID POSITIVE 10/08/2020   JOINT REPLACEMENT     KNEE SURGERY Right 09/24/2017   plates and pins   TOTAL SHOULDER ARTHROPLASTY Right 09/27/2015    There were no vitals filed for this visit.  [facility] Adult PT Treatment/Exercise - 02/27/21 0001       Knee/Hip Exercises: Standing   Hip Flexion Stengthening    Hip Flexion Limitations stair taps x2 min 6" step 5 lbs    Hip Abduction Stengthening;Both;2 sets;10 reps    Abduction Limitations 5 lbs    Gait Training resisted retro-forward x 2 min with CGA and cane    Other Standing Knee Exercises sidestepping x 2 min 5 lbs      Knee/Hip Exercises: Seated   Long Arc Quad Strengthening;Both;3 sets;10 reps    Long Arc Quad Weight 5 lbs.                      PT Short Term Goals - 02/04/21 1611       PT SHORT TERM GOAL #1   Title Patient will be  independent with HEP in order to improve functional outcomes.    Time 3    Period Weeks    Status New    Target Date 02/25/21      PT SHORT TERM GOAL #2   Title Patient will report at least 25% improvement in symptoms for improved quality of life.    Time 3    Period Weeks    Status New    Target Date 02/25/21      PT SHORT TERM GOAL #3   Title Demo improved gait tolerance as evidenced by distance of 150 ft during 2MWT    Time 3    Period Weeks    Status New    Target Date 02/25/21               PT Long Term Goals - 02/04/21 1612       PT LONG TERM GOAL #1   Title Patient will report at least 75% improvement in symptoms for improved quality of life.    Time 6    Period Weeks    Status New    Target Date 03/18/21      PT LONG TERM GOAL #2   Title Patient will be able to ambulate at least 226 feet in 2MWT with LRAD in order to demonstrate improved gait speed for community ambulation.    Time 6    Period Weeks    Status New    Target Date 03/18/21      PT LONG TERM GOAL #3   Title Pt will be able to perform 5xSTS in 15 sec or < with no UE support and proper mechanics to demo improved balance and functional BLE strength.    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 Difficulty with foot clearance on steps due to weakness. Continued PT sessions indicated to improve strength, activity tolerance, and per pt goal decrease dependence on RW    Personal Factors and Comorbidities Comorbidity 1;Time since onset of injury/illness/exacerbation;Past/Current Experience;Comorbidity 2;Fitness    Comorbidities PMH and hx of mutiple physical trauma, Cirrhosis    Examination-Activity Limitations Bathing;Bed Mobility;Bend;Lift;Toileting;Stand;Squat;Locomotion Level;Transfers     Examination-Participation Restrictions Cleaning;Community Activity;Driving;Shop;Occupation;Meal Prep;Yard Work;Volunteer    Stability/Clinical Decision Making Stable/Uncomplicated    Rehab Potential Good    PT Frequency 2x / week  PT Duration 6 weeks    PT Treatment/Interventions ADLs/Self Care Home Management;Aquatic Therapy;Biofeedback;Cryotherapy;Electrical Stimulation;DME Instruction;Ultrasound;Moist Heat;Gait training;Stair training;Functional mobility training;Therapeutic activities;Therapeutic exercise;Balance training;Patient/family education;Neuromuscular re-education;Manual techniques;Taping;Energy conservation;Dry needling;Spinal Manipulations;Joint Manipulations;Compression bandaging;Manual lymph drainage    PT Next Visit Plan LE strengthening, Functional strengthening, gait/balance training, begin UE strengthening as well    PT Home Exercise Plan marching, LAQ, ankle pumps 6/28 sit/stands; 02/21/21: begin walking program    Consulted and Agree with Plan of Care Patient             Patient will benefit from skilled therapeutic intervention in order to improve the following deficits and impairments:  Abnormal gait, Decreased activity tolerance, Decreased balance, Decreased mobility, Decreased knowledge of use of DME, Decreased endurance, Decreased range of motion, Decreased strength, Difficulty walking, Impaired perceived functional ability, Improper body mechanics, Pain  Visit Diagnosis: Other abnormalities of gait and mobility  Difficulty in walking, not elsewhere classified  Muscle weakness (generalized)     Problem List Patient Active Problem List   Diagnosis Date Noted   Iron deficiency anemia due to chronic blood loss 02/13/2021   History of uterine cancer 02/13/2021   Decompensated hepatic cirrhosis ([place]) 10/25/2020   OSA (obstructive sleep apnea) 10/25/2020   Glossitis 10/25/2020   Grade IV internal hemorrhoids    Acute blood loss anemia 10/08/2020    Hyponatremia 10/08/2020   Rectal bleeding 10/08/2020   Acute respiratory failure with hypoxia ([place]) 09/10/2020   Pneumonia due to COVID-19 virus 09/08/2020   Hip fracture, right ([place]) 09/04/2020   Closed right hip fracture ([place]) 09/02/2020   Diabetes mellitus with hyperglycemia ([place]) 09/02/2020   Hypertension    Thalassemia minor    Pancytopenia ([place])    HLD (hyperlipidemia)    Acute renal failure superimposed on stage 3a chronic kidney disease ([place])    Depression    Fall at home, initial encounter    Nondisplaced fracture of greater trochanter of right femur, initial encounter for closed fracture ([place])    3:27 PM, 02/27/21 M. Sherlyn Lees, PT, DPT Physical Therapist- [place] Office Number: [phone]   [facility] [address] [place], [place], [address] Phone: [phone]   Fax:  [phone]  Name: HARPREET SIGNORE MRN: [id number] Date of Birth: 09-Dec-1955

## 2021-03-01 ENCOUNTER — Inpatient Hospital Stay (HOSPITAL_COMMUNITY): Payer: Managed Care, Other (non HMO)

## 2021-03-01 ENCOUNTER — Other Ambulatory Visit: Payer: Self-pay

## 2021-03-01 ENCOUNTER — Encounter (HOSPITAL_COMMUNITY): Payer: Self-pay

## 2021-03-01 VITALS — BP 100/54 | HR 75 | Temp 96.7°F | Resp 18

## 2021-03-01 DIAGNOSIS — D509 Iron deficiency anemia, unspecified: Secondary | ICD-10-CM | POA: Diagnosis not present

## 2021-03-01 DIAGNOSIS — D500 Iron deficiency anemia secondary to blood loss (chronic): Secondary | ICD-10-CM

## 2021-03-01 MED ORDER — SODIUM CHLORIDE 0.9 % IV SOLN: Freq: Once | INTRAVENOUS | Status: AC

## 2021-03-01 MED ORDER — ACETAMINOPHEN 325 MG PO TABS
650.0000 mg | ORAL_TABLET | Freq: Once | ORAL | Status: AC
  Administered 2021-03-01: 650 mg via ORAL
  Filled 2021-03-01: qty 2

## 2021-03-01 MED ORDER — SODIUM CHLORIDE 0.9 % IV SOLN
300.0000 mg | Freq: Once | INTRAVENOUS | Status: AC
  Administered 2021-03-01: 300 mg via INTRAVENOUS
  Filled 2021-03-01: qty 5

## 2021-03-01 MED ORDER — LORATADINE 10 MG PO TABS
10.0000 mg | ORAL_TABLET | Freq: Once | ORAL | Status: AC
  Administered 2021-03-01: 10 mg via ORAL
  Filled 2021-03-01: qty 1

## 2021-03-01 NOTE — Progress Notes (Signed)
Patient tolerated iron infusion with no complaints voiced. Peripheral IV site clean and dry with good blood return noted before and after infusion. Band aid applied. VSS with discharge and left in satisfactory condition with no s/s of distress noted.

## 2021-03-01 NOTE — Patient Instructions (Signed)
[facility]  Discharge Instructions: Thank you for choosing [facility] to provide your oncology and hematology care.  If you have a lab appointment with the [facility], please come in thru the Main Entrance and check in at the main information desk.  Wear comfortable clothing and clothing appropriate for easy access to any Portacath or PICC line.   We strive to give you quality time with your provider. You may need to reschedule your appointment if you arrive late (15 or more minutes).  Arriving late affects you and other patients whose appointments are after yours.  Also, if you miss three or more appointments without notifying the office, you may be dismissed from the clinic at the provider's discretion.      For prescription refill requests, have your pharmacy contact our office and allow 72 hours for refills to be completed.    Today you received the following: Venofer   To help prevent nausea and vomiting after your treatment, we encourage you to take your nausea medication as directed.  BELOW ARE SYMPTOMS THAT SHOULD BE REPORTED IMMEDIATELY: *FEVER GREATER THAN 100.4 F (38 C) OR HIGHER *CHILLS OR SWEATING *NAUSEA AND VOMITING THAT IS NOT CONTROLLED WITH YOUR NAUSEA MEDICATION *UNUSUAL SHORTNESS OF BREATH *UNUSUAL BRUISING OR BLEEDING *URINARY PROBLEMS (pain or burning when urinating, or frequent urination) *BOWEL PROBLEMS (unusual diarrhea, constipation, pain near the anus) TENDERNESS IN MOUTH AND THROAT WITH OR WITHOUT PRESENCE OF ULCERS (sore throat, sores in mouth, or a toothache) UNUSUAL RASH, SWELLING OR PAIN  UNUSUAL VAGINAL DISCHARGE OR ITCHING   Items with * indicate a potential emergency and should be followed up as soon as possible or go to the Emergency Department if any problems should occur.  Please show the CHEMOTHERAPY ALERT CARD or IMMUNOTHERAPY ALERT CARD at check-in to the Emergency Department and triage nurse.  Should you have  questions after your visit or need to cancel or reschedule your appointment, please contact [facility] [phone]  and follow the prompts.  Office hours are 8:00 a.m. to 4:30 p.m. Monday - Friday. Please note that voicemails left after 4:00 p.m. may not be returned until the following business day.  We are closed weekends and major holidays. You have access to a nurse at all times for urgent questions. Please call the main number to the clinic [phone] and follow the prompts.  For any non-urgent questions, you may also contact your provider using MyChart. We now offer e-Visits for anyone 65 and older to request care online for non-urgent symptoms. For details visit [email or web address].   Also download the MyChart app! Go to the app store, search "MyChart", open the app, select [facility], and log in with your MyChart username and password.  Due to Covid, a mask is required upon entering the hospital/clinic. If you do not have a mask, one will be given to you upon arrival. For doctor visits, patients may have 1 support person aged 65 or older with them. For treatment visits, patients cannot have anyone with them due to current Covid guidelines and our immunocompromised population.

## 2021-03-04 ENCOUNTER — Encounter (HOSPITAL_COMMUNITY): Payer: Self-pay | Admitting: Physical Therapy

## 2021-03-04 ENCOUNTER — Ambulatory Visit (HOSPITAL_COMMUNITY): Payer: Managed Care, Other (non HMO) | Admitting: Physical Therapy

## 2021-03-04 ENCOUNTER — Other Ambulatory Visit: Payer: Self-pay

## 2021-03-04 DIAGNOSIS — M6281 Muscle weakness (generalized): Secondary | ICD-10-CM | POA: Diagnosis not present

## 2021-03-04 DIAGNOSIS — R262 Difficulty in walking, not elsewhere classified: Secondary | ICD-10-CM

## 2021-03-04 DIAGNOSIS — R2689 Other abnormalities of gait and mobility: Secondary | ICD-10-CM

## 2021-03-04 NOTE — Therapy (Signed)
[place], [place], [address] Phone: [phone]   Fax:  [phone]  Physical Therapy Treatment  Patient Details  Name: SHALISA MCQUADE MRN: [id number] Date of Birth: 1956-06-11 Referring Provider (PT): Mortimer Fries PA-C   Encounter Date: 03/04/2021   PT End of Session - 03/04/21 1451     Visit Number 7    Number of Visits 12    Date for PT Re-Evaluation 03/18/21    Authorization Type [employer]    Authorization Time Period VL based on medical necessity, 60 total    Authorization - Visit Number 10    Authorization - Number of Visits 60    Progress Note Due on Visit 10    PT Start Time 1450    PT Stop Time 1530    PT Time Calculation (min) 40 min    Equipment Utilized During Treatment Gait belt    Activity Tolerance Patient tolerated treatment well;Patient limited by fatigue    Behavior During Therapy WFL for tasks assessed/performed             Past Medical History:  Diagnosis Date   Anxiety    Arthritis    Cirrhosis of liver ([place])    Diabetes mellitus without complication ([place])    Dyspnea    DOE   GERD (gastroesophageal reflux disease)    Hepatitis    PAST   Hypertension    Neuropathy    Neuropathy, diabetic ([place])    Pneumonia    Sinus complaint    Sleep apnea    CPAP   Thalassemia minor 1992    Past Surgical History:  Procedure Laterality Date   ABDOMINAL HYSTERECTOMY     BREAST BIOPSY Right 02/15/2018   [place] bx 6-6:30 ribbon shape, ONE CORE FRAGMENT WITH FIBROSIS. ONE CORE FRAGMENT WITH PORTION OF A DILATED   BREAST BIOPSY Right 02/15/2018   [place] bx 9:00 heart shape, USUAL DUCTAL HYPERPLASIA   BREAST LUMPECTOMY Right 03/09/2018   Procedure: BREAST LUMPECTOMY x 2;  Surgeon: Benjamine Sprague, DO;  Location: [facility] ORS;  Service: General;  Laterality: Right;   CATARACT EXTRACTION W/PHACO Left 06/11/2016   Procedure: CATARACT EXTRACTION PHACO AND INTRAOCULAR LENS PLACEMENT (IOC);  Surgeon: Estill Cotta, MD;  Location: [facility] ORS;  Service: Ophthalmology;  Laterality: Left;  Lot # [id number] H US:01:38.6 AP%:26.4 CDE:44.15   CATARACT EXTRACTION W/PHACO Right 06/15/2018   Procedure: CATARACT EXTRACTION PHACO AND INTRAOCULAR LENS PLACEMENT (IOC);  Surgeon: Birder Robson, MD;  Location: [facility];  Service: Ophthalmology;  Laterality: Right;  [place] 00:38.2 CDE 4.23 Fluid Pack Lot # [id number]   COLONOSCOPIES     COLONOSCOPY WITH PROPOFOL N/A 10/10/2020   Procedure: COLONOSCOPY WITH PROPOFOL;  Surgeon: Lesly Rubenstein, MD;  Location: [facility] ENDOSCOPY;  Service: Endoscopy;  Laterality: N/A;   COLONOSCOPY WITH PROPOFOL N/A 11/20/2020   Procedure: COLONOSCOPY WITH PROPOFOL;  Surgeon: Lesly Rubenstein, MD;  Location: [facility] ENDOSCOPY;  Service: Endoscopy;  Laterality: N/A;  DM STAT CBC, BMP COVID POSITIVE 09/02/2020   CTR     ESOPHAGOGASTRODUODENOSCOPY (EGD) WITH PROPOFOL N/A 10/10/2020   Procedure: ESOPHAGOGASTRODUODENOSCOPY (EGD) WITH PROPOFOL;  Surgeon: Lesly Rubenstein, MD;  Location: [facility] ENDOSCOPY;  Service: Endoscopy;  Laterality: N/A;  COVID POSITIVE 10/08/2020   JOINT REPLACEMENT     KNEE SURGERY Right 09/24/2017   plates and pins   TOTAL SHOULDER ARTHROPLASTY Right 09/27/2015    There were no vitals filed for this visit.   Subjective Assessment - 03/04/21  1451     Subjective Patient states no new falls since last one a few weeks ago. Her home exercises are going well.    Patient Stated Goals improve walking    Currently in Pain? No/denies                               [facility] Adult PT Treatment/Exercise - 03/04/21 0001       Knee/Hip Exercises: Aerobic   Nustep level 3 5 minutes      Knee/Hip Exercises: Standing   Hip Flexion Stengthening    Hip Flexion Limitations stair taps x2 min 6" step 5 lbs    Hip Abduction Stengthening;Both;10 reps;3 sets    Abduction Limitations 5 lbs    Lateral Step Up Both;1 set;10 reps;Hand Hold: 2;Step Height: 4"     Forward Step Up Both;1 set;10 reps;Hand Hold: 2;Step Height: 4"    Other Standing Knee Exercises sidestepping x 2 min 5 lbs                    PT Education - 03/04/21 1451     Education Details HEP    Person(s) Educated Patient    Methods Explanation    Comprehension Verbalized understanding              PT Short Term Goals - 02/04/21 1611       PT SHORT TERM GOAL #1   Title Patient will be independent with HEP in order to improve functional outcomes.    Time 3    Period Weeks    Status New    Target Date 02/25/21      PT SHORT TERM GOAL #2   Title Patient will report at least 25% improvement in symptoms for improved quality of life.    Time 3    Period Weeks    Status New    Target Date 02/25/21      PT SHORT TERM GOAL #3   Title Demo improved gait tolerance as evidenced by distance of 150 ft during 2MWT    Time 3    Period Weeks    Status New    Target Date 02/25/21               PT Long Term Goals - 02/04/21 1612       PT LONG TERM GOAL #1   Title Patient will report at least 75% improvement in symptoms for improved quality of life.    Time 6    Period Weeks    Status New    Target Date 03/18/21      PT LONG TERM GOAL #2   Title Patient will be able to ambulate at least 226 feet in 2MWT with LRAD in order to demonstrate improved gait speed for community ambulation.    Time 6    Period Weeks    Status New    Target Date 03/18/21      PT LONG TERM GOAL #3   Title Pt will be able to perform 5xSTS in 15 sec or < with no UE support and proper mechanics to demo improved balance and functional BLE strength.    Time 6    Period Weeks    Status New    Target Date 03/18/21                   Plan - 03/04/21 1451     Clinical  Impression Statement Continued with LE strengthening and patient requires intermittent seated rest breaks for fatigue. Patient requires unilateral HHA for hip abduction exercise with cueing for glute  activation on stance leg with good carry over but quick fatigue on RLE. Patient requires bilateral HHE and CGA for safety with step up exercises. Ended session with Nu step for improving cardiovascular fitness and endurance. Patient will continue to benefit from skilled physical therapy in order to reduce impairment and improve function.    Personal Factors and Comorbidities Comorbidity 1;Time since onset of injury/illness/exacerbation;Past/Current Experience;Comorbidity 2;Fitness    Comorbidities PMH and hx of mutiple physical trauma, Cirrhosis    Examination-Activity Limitations Bathing;Bed Mobility;Bend;Lift;Toileting;Stand;Squat;Locomotion Level;Transfers    Examination-Participation Restrictions Cleaning;Community Activity;Driving;Shop;Occupation;Meal Prep;Yard Work;Volunteer    Stability/Clinical Decision Making Stable/Uncomplicated    Rehab Potential Good    PT Frequency 2x / week    PT Duration 6 weeks    PT Treatment/Interventions ADLs/Self Care Home Management;Aquatic Therapy;Biofeedback;Cryotherapy;Electrical Stimulation;DME Instruction;Ultrasound;Moist Heat;Gait training;Stair training;Functional mobility training;Therapeutic activities;Therapeutic exercise;Balance training;Patient/family education;Neuromuscular re-education;Manual techniques;Taping;Energy conservation;Dry needling;Spinal Manipulations;Joint Manipulations;Compression bandaging;Manual lymph drainage    PT Next Visit Plan LE strengthening, Functional strengthening, gait/balance training, begin UE strengthening as well    PT Home Exercise Plan marching, LAQ, ankle pumps 6/28 sit/stands; 02/21/21: begin walking program    Consulted and Agree with Plan of Care Patient             Patient will benefit from skilled therapeutic intervention in order to improve the following deficits and impairments:  Abnormal gait, Decreased activity tolerance, Decreased balance, Decreased mobility, Decreased knowledge of use of DME, Decreased  endurance, Decreased range of motion, Decreased strength, Difficulty walking, Impaired perceived functional ability, Improper body mechanics, Pain  Visit Diagnosis: Other abnormalities of gait and mobility  Difficulty in walking, not elsewhere classified  Muscle weakness (generalized)     Problem List Patient Active Problem List   Diagnosis Date Noted   Iron deficiency anemia due to chronic blood loss 02/13/2021   History of uterine cancer 02/13/2021   Decompensated hepatic cirrhosis ([place]) 10/25/2020   OSA (obstructive sleep apnea) 10/25/2020   Glossitis 10/25/2020   Grade IV internal hemorrhoids    Acute blood loss anemia 10/08/2020   Hyponatremia 10/08/2020   Rectal bleeding 10/08/2020   Acute respiratory failure with hypoxia ([place]) 09/10/2020   Pneumonia due to COVID-19 virus 09/08/2020   Hip fracture, right ([place]) 09/04/2020   Closed right hip fracture ([place]) 09/02/2020   Diabetes mellitus with hyperglycemia ([place]) 09/02/2020   Hypertension    Thalassemia minor    Pancytopenia ([place])    HLD (hyperlipidemia)    Acute renal failure superimposed on stage 3a chronic kidney disease ([place])    Depression    Fall at home, initial encounter    Nondisplaced fracture of greater trochanter of right femur, initial encounter for closed fracture ([place])     3:26 PM, 03/04/21 Mearl Latin PT, DPT Physical Therapist at [place], [place], [address] Phone: [phone]   Fax:  [phone]  Name: ALLEIGH MOLLICA MRN: [id number] Date of Birth: 03/25/1956

## 2021-03-06 ENCOUNTER — Ambulatory Visit (HOSPITAL_COMMUNITY): Payer: Managed Care, Other (non HMO) | Admitting: Physical Therapy

## 2021-03-06 ENCOUNTER — Other Ambulatory Visit: Payer: Self-pay

## 2021-03-06 ENCOUNTER — Encounter (HOSPITAL_COMMUNITY): Payer: Self-pay | Admitting: Physical Therapy

## 2021-03-06 DIAGNOSIS — M6281 Muscle weakness (generalized): Secondary | ICD-10-CM | POA: Diagnosis not present

## 2021-03-06 DIAGNOSIS — R262 Difficulty in walking, not elsewhere classified: Secondary | ICD-10-CM

## 2021-03-06 DIAGNOSIS — R2689 Other abnormalities of gait and mobility: Secondary | ICD-10-CM

## 2021-03-06 NOTE — Therapy (Signed)
[place], [place], [address] Phone: [phone]   Fax:  [phone]  Physical Therapy Treatment  Patient Details  Name: Rachel Huber MRN: [id number] Date of Birth: 04/04/1956 Referring Provider (PT): Mortimer Fries PA-C   Encounter Date: 03/06/2021   PT End of Session - 03/06/21 1453     Visit Number 8    Number of Visits 12    Date for PT Re-Evaluation 03/18/21    Authorization Type [employer]    Authorization Time Period VL based on medical necessity, 60 total    Authorization - Visit Number 11    Authorization - Number of Visits 60    Progress Note Due on Visit 10    PT Start Time 1452    PT Stop Time 1530    PT Time Calculation (min) 38 min    Equipment Utilized During Treatment Gait belt    Activity Tolerance Patient tolerated treatment well;Patient limited by fatigue    Behavior During Therapy WFL for tasks assessed/performed             Past Medical History:  Diagnosis Date   Anxiety    Arthritis    Cirrhosis of liver ([place])    Diabetes mellitus without complication ([place])    Dyspnea    DOE   GERD (gastroesophageal reflux disease)    Hepatitis    PAST   Hypertension    Neuropathy    Neuropathy, diabetic ([place])    Pneumonia    Sinus complaint    Sleep apnea    CPAP   Thalassemia minor 1992    Past Surgical History:  Procedure Laterality Date   ABDOMINAL HYSTERECTOMY     BREAST BIOPSY Right 02/15/2018   [place] bx 6-6:30 ribbon shape, ONE CORE FRAGMENT WITH FIBROSIS. ONE CORE FRAGMENT WITH PORTION OF A DILATED   BREAST BIOPSY Right 02/15/2018   [place] bx 9:00 heart shape, USUAL DUCTAL HYPERPLASIA   BREAST LUMPECTOMY Right 03/09/2018   Procedure: BREAST LUMPECTOMY x 2;  Surgeon: Benjamine Sprague, DO;  Location: [facility] ORS;  Service: General;  Laterality: Right;   CATARACT EXTRACTION W/PHACO Left 06/11/2016   Procedure: CATARACT EXTRACTION PHACO AND INTRAOCULAR LENS PLACEMENT (IOC);  Surgeon: Estill Cotta, MD;  Location: [facility]S;  Service: Ophthalmology;  Laterality: Left;  Lot # [id number] H US:01:38.6 AP%:26.4 CDE:44.15   CATARACT EXTRACTION W/PHACO Right 06/15/2018   Procedure: CATARACT EXTRACTION PHACO AND INTRAOCULAR LENS PLACEMENT (IOC);  Surgeon: Birder Robson, MD;  Location: [facility] ORS;  Service: Ophthalmology;  Laterality: Right;  [place] 00:38.2 CDE 4.23 Fluid Pack Lot # [id number]   COLONOSCOPIES     COLONOSCOPY WITH PROPOFOL N/A 10/10/2020   Procedure: COLONOSCOPY WITH PROPOFOL;  Surgeon: Lesly Rubenstein, MD;  Location: [facility] ENDOSCOPY;  Service: Endoscopy;  Laterality: N/A;   COLONOSCOPY WITH PROPOFOL N/A 11/20/2020   Procedure: COLONOSCOPY WITH PROPOFOL;  Surgeon: Lesly Rubenstein, MD;  Location: [facility] ENDOSCOPY;  Service: Endoscopy;  Laterality: N/A;  DM STAT CBC, BMP COVID POSITIVE 09/02/2020   CTR     ESOPHAGOGASTRODUODENOSCOPY (EGD) WITH PROPOFOL N/A 10/10/2020   Procedure: ESOPHAGOGASTRODUODENOSCOPY (EGD) WITH PROPOFOL;  Surgeon: Lesly Rubenstein, MD;  Location: [facility] ENDOSCOPY;  Service: Endoscopy;  Laterality: N/A;  COVID POSITIVE 10/08/2020   JOINT REPLACEMENT     KNEE SURGERY Right 09/24/2017   plates and pins   TOTAL SHOULDER ARTHROPLASTY Right 09/27/2015    There were no vitals filed for this visit.   Subjective Assessment - 03/06/21  1454     Subjective Patient states she was warn out after last session.    Patient Stated Goals improve walking    Currently in Pain? No/denies                               [facility] Adult PT Treatment/Exercise - 03/06/21 0001       Knee/Hip Exercises: Aerobic   Nustep level 3 5 minutes      Knee/Hip Exercises: Standing   Lateral Step Up Both;1 set;10 reps;Hand Hold: 2;Step Height: 4"    Forward Step Up Both;10 reps;Hand Hold: 2;Step Height: 4";2 sets    Other Standing Knee Exercises row, extension with green band 2x 15 each                    PT Education - 03/06/21 1454      Education Details HEP    Person(s) Educated Patient    Methods Explanation    Comprehension Verbalized understanding              PT Short Term Goals - 02/04/21 1611       PT SHORT TERM GOAL #1   Title Patient will be independent with HEP in order to improve functional outcomes.    Time 3    Period Weeks    Status New    Target Date 02/25/21      PT SHORT TERM GOAL #2   Title Patient will report at least 25% improvement in symptoms for improved quality of life.    Time 3    Period Weeks    Status New    Target Date 02/25/21      PT SHORT TERM GOAL #3   Title Demo improved gait tolerance as evidenced by distance of 150 ft during 2MWT    Time 3    Period Weeks    Status New    Target Date 02/25/21               PT Long Term Goals - 02/04/21 1612       PT LONG TERM GOAL #1   Title Patient will report at least 75% improvement in symptoms for improved quality of life.    Time 6    Period Weeks    Status New    Target Date 03/18/21      PT LONG TERM GOAL #2   Title Patient will be able to ambulate at least 226 feet in 2MWT with LRAD in order to demonstrate improved gait speed for community ambulation.    Time 6    Period Weeks    Status New    Target Date 03/18/21      PT LONG TERM GOAL #3   Title Pt will be able to perform 5xSTS in 15 sec or < with no UE support and proper mechanics to demo improved balance and functional BLE strength.    Time 6    Period Weeks    Status New    Target Date 03/18/21                   Plan - 03/06/21 1453     Clinical Impression Statement Began session with nu step for dynamic warm up and conditioning, patient less fatigued with completing at beginning of session rather than end of session. Patient able to complete increased reps of step up exercise today with fatigue following. Patient  requires frequent seated rest breaks during session due to impaired activity tolerance. Began with postural strengthening today  for UE strength, conditioning, and balance which patient tolerates well.  Patient will continue to benefit from skilled physical therapy in order to reduce impairment and improve function.    Personal Factors and Comorbidities Comorbidity 1;Time since onset of injury/illness/exacerbation;Past/Current Experience;Comorbidity 2;Fitness    Comorbidities PMH and hx of mutiple physical trauma, Cirrhosis    Examination-Activity Limitations Bathing;Bed Mobility;Bend;Lift;Toileting;Stand;Squat;Locomotion Level;Transfers    Examination-Participation Restrictions Cleaning;Community Activity;Driving;Shop;Occupation;Meal Prep;Yard Work;Volunteer    Stability/Clinical Decision Making Stable/Uncomplicated    Rehab Potential Good    PT Frequency 2x / week    PT Duration 6 weeks    PT Treatment/Interventions ADLs/Self Care Home Management;Aquatic Therapy;Biofeedback;Cryotherapy;Electrical Stimulation;DME Instruction;Ultrasound;Moist Heat;Gait training;Stair training;Functional mobility training;Therapeutic activities;Therapeutic exercise;Balance training;Patient/family education;Neuromuscular re-education;Manual techniques;Taping;Energy conservation;Dry needling;Spinal Manipulations;Joint Manipulations;Compression bandaging;Manual lymph drainage    PT Next Visit Plan LE strengthening, Functional strengthening, gait/balance training, begin UE strengthening as well    PT Home Exercise Plan marching, LAQ, ankle pumps 6/28 sit/stands; 02/21/21: begin walking program    Consulted and Agree with Plan of Care Patient             Patient will benefit from skilled therapeutic intervention in order to improve the following deficits and impairments:  Abnormal gait, Decreased activity tolerance, Decreased balance, Decreased mobility, Decreased knowledge of use of DME, Decreased endurance, Decreased range of motion, Decreased strength, Difficulty walking, Impaired perceived functional ability, Improper body mechanics,  Pain  Visit Diagnosis: Other abnormalities of gait and mobility  Difficulty in walking, not elsewhere classified  Muscle weakness (generalized)     Problem List Patient Active Problem List   Diagnosis Date Noted   Iron deficiency anemia due to chronic blood loss 02/13/2021   History of uterine cancer 02/13/2021   Decompensated hepatic cirrhosis ([place]) 10/25/2020   OSA (obstructive sleep apnea) 10/25/2020   Glossitis 10/25/2020   Grade IV internal hemorrhoids    Acute blood loss anemia 10/08/2020   Hyponatremia 10/08/2020   Rectal bleeding 10/08/2020   Acute respiratory failure with hypoxia ([place]) 09/10/2020   Pneumonia due to COVID-19 virus 09/08/2020   Hip fracture, right ([place]) 09/04/2020   Closed right hip fracture ([place]) 09/02/2020   Diabetes mellitus with hyperglycemia ([place]) 09/02/2020   Hypertension    Thalassemia minor    Pancytopenia ([place])    HLD (hyperlipidemia)    Acute renal failure superimposed on stage 3a chronic kidney disease ([place])    Depression    Fall at home, initial encounter    Nondisplaced fracture of greater trochanter of right femur, initial encounter for closed fracture [facility])     3:29 PM, 03/06/21 Mearl Latin PT, DPT Physical Therapist at [place], [place], [address] Phone: [phone]   Fax:  [phone]  Name: Rachel Huber MRN: [id number] Date of Birth: May 10, 1956

## 2021-03-08 ENCOUNTER — Other Ambulatory Visit: Payer: Self-pay

## 2021-03-08 ENCOUNTER — Inpatient Hospital Stay (HOSPITAL_COMMUNITY): Payer: Managed Care, Other (non HMO)

## 2021-03-08 VITALS — BP 110/53 | HR 82 | Temp 97.7°F | Resp 18

## 2021-03-08 DIAGNOSIS — D509 Iron deficiency anemia, unspecified: Secondary | ICD-10-CM | POA: Diagnosis not present

## 2021-03-08 DIAGNOSIS — D500 Iron deficiency anemia secondary to blood loss (chronic): Secondary | ICD-10-CM

## 2021-03-08 MED ORDER — ACETAMINOPHEN 325 MG PO TABS
650.0000 mg | ORAL_TABLET | Freq: Once | ORAL | Status: AC
  Administered 2021-03-08: 650 mg via ORAL

## 2021-03-08 MED ORDER — SODIUM CHLORIDE 0.9 % IV SOLN
300.0000 mg | Freq: Once | INTRAVENOUS | Status: AC
  Administered 2021-03-08: 300 mg via INTRAVENOUS
  Filled 2021-03-08: qty 300

## 2021-03-08 MED ORDER — LORATADINE 10 MG PO TABS
10.0000 mg | ORAL_TABLET | Freq: Once | ORAL | Status: AC
  Administered 2021-03-08: 10 mg via ORAL

## 2021-03-08 MED ORDER — SODIUM CHLORIDE 0.9 % IV SOLN: Freq: Once | INTRAVENOUS | Status: AC

## 2021-03-08 MED ORDER — LORATADINE 10 MG PO TABS
ORAL_TABLET | ORAL | Status: AC
  Filled 2021-03-08: qty 1

## 2021-03-08 MED ORDER — ACETAMINOPHEN 325 MG PO TABS
ORAL_TABLET | ORAL | Status: AC
  Filled 2021-03-08: qty 2

## 2021-03-08 NOTE — Progress Notes (Signed)
Patient presents today for Venofer infusion per providers order.  Vital signs WNL.  Patient has no new complaints at this time.  Peripheral IV started and blood return noted pre and post infusion.    Venofer infusion given today per MD orders.  Stable during infusion without adverse affects.  Vital signs stable.  No complaints at this time.  Discharge from clinic ambulatory in stable condition.  Alert and oriented X 3.  Follow up with [facility] as scheduled.

## 2021-03-11 ENCOUNTER — Encounter (HOSPITAL_COMMUNITY): Payer: Self-pay

## 2021-03-11 ENCOUNTER — Ambulatory Visit (HOSPITAL_COMMUNITY): Payer: Managed Care, Other (non HMO)

## 2021-03-11 ENCOUNTER — Other Ambulatory Visit: Payer: Self-pay

## 2021-03-11 DIAGNOSIS — M6281 Muscle weakness (generalized): Secondary | ICD-10-CM

## 2021-03-11 DIAGNOSIS — R2689 Other abnormalities of gait and mobility: Secondary | ICD-10-CM

## 2021-03-11 DIAGNOSIS — R262 Difficulty in walking, not elsewhere classified: Secondary | ICD-10-CM

## 2021-03-11 NOTE — Therapy (Signed)
[place], [place], [address] Phone: [phone]   Fax:  [phone]  Physical Therapy Treatment  Patient Details  Name: BYRON TIPPING MRN: [id number] Date of Birth: [birth year] Referring Provider (PT): Mortimer Fries PA-C   Encounter Date: 03/11/2021   PT End of Session - 03/11/21 1310     Visit Number 9    Number of Visits 12    Date for PT Re-Evaluation 03/18/21    Authorization Type [employer]    Authorization Time Period VL based on medical necessity, 60 total    Authorization - Visit Number 12    Authorization - Number of Visits 60    Progress Note Due on Visit 10    PT Start Time 1303    PT Stop Time 1345    PT Time Calculation (min) 42 min    Equipment Utilized During Treatment Gait belt    Activity Tolerance Patient tolerated treatment well;Patient limited by fatigue    Behavior During Therapy WFL for tasks assessed/performed             Past Medical History:  Diagnosis Date   Anxiety    Arthritis    Cirrhosis of liver ([place])    Diabetes mellitus without complication ([employer])    Dyspnea    DOE   GERD (gastroesophageal reflux disease)    Hepatitis    PAST   Hypertension    Neuropathy    Neuropathy, diabetic ([place])    Pneumonia    Sinus complaint    Sleep apnea    CPAP   Thalassemia minor 1992    Past Surgical History:  Procedure Laterality Date   ABDOMINAL HYSTERECTOMY     BREAST BIOPSY Right 02/15/2018   [place] bx 6-6:30 ribbon shape, ONE CORE FRAGMENT WITH FIBROSIS. ONE CORE FRAGMENT WITH PORTION OF A DILATED   BREAST BIOPSY Right 02/15/2018   [place] bx 9:00 heart shape, USUAL DUCTAL HYPERPLASIA   BREAST LUMPECTOMY Right 03/09/2018   Procedure: BREAST LUMPECTOMY x 2;  Surgeon: Benjamine Sprague, DO;  Location: [facility] ORS;  Service: General;  Laterality: Right;   CATARACT EXTRACTION W/PHACO Left 06/11/2016   Procedure: CATARACT EXTRACTION PHACO AND INTRAOCULAR LENS PLACEMENT (IOC);  Surgeon: Estill Cotta, MD;  Location: [facility] ORS;  Service: Ophthalmology;  Laterality: Left;  Lot # [id number] H US:01:38.6 AP%:26.4 CDE:44.15   CATARACT EXTRACTION W/PHACO Right 06/15/2018   Procedure: CATARACT EXTRACTION PHACO AND INTRAOCULAR LENS PLACEMENT (IOC);  Surgeon: Birder Robson, MD;  Location: [facility] ORS;  Service: Ophthalmology;  Laterality: Right;  [place] 00:38.2 CDE 4.23 Fluid Pack Lot # [id number]   COLONOSCOPIES     COLONOSCOPY WITH PROPOFOL N/A 10/10/2020   Procedure: COLONOSCOPY WITH PROPOFOL;  Surgeon: Lesly Rubenstein, MD;  Location: [facility] ENDOSCOPY;  Service: Endoscopy;  Laterality: N/A;   COLONOSCOPY WITH PROPOFOL N/A 11/20/2020   Procedure: COLONOSCOPY WITH PROPOFOL;  Surgeon: Lesly Rubenstein, MD;  Location: [facility] ENDOSCOPY;  Service: Endoscopy;  Laterality: N/A;  DM STAT CBC, BMP COVID POSITIVE 09/02/2020   CTR     ESOPHAGOGASTRODUODENOSCOPY (EGD) WITH PROPOFOL N/A 10/10/2020   Procedure: ESOPHAGOGASTRODUODENOSCOPY (EGD) WITH PROPOFOL;  Surgeon: Lesly Rubenstein, MD;  Location: [facility] ENDOSCOPY;  Service: Endoscopy;  Laterality: N/A;  COVID POSITIVE 10/08/2020   JOINT REPLACEMENT     KNEE SURGERY Right 09/24/2017   plates and pins   TOTAL SHOULDER ARTHROPLASTY Right 09/27/2015    There were no vitals filed for this visit.   Subjective Assessment - 03/11/21  1308     Subjective No ill effects from recent iron infusion. Pt reports no instances of LOB    Currently in Pain? No/denies    Pain Score 0-No pain                [facility] PT Assessment - 03/11/21 0001       Assessment   Medical Diagnosis R hand pain, Weakness, Cirrhosis of liver, weakness/balance    Referring Provider (PT) Mortimer Fries PA-C    Onset Date/Surgical Date 09/01/20                           [facility] Adult PT Treatment/Exercise - 03/11/21 0001       Knee/Hip Exercises: Standing   Heel Raises Both;2 sets;10 reps    Knee Flexion Strengthening;Both;2 sets;10 reps    Knee Flexion  Limitations 5#    SLS foot elevated on 6" step performing lift-offs for 30 sec intervals 2x2 min    SLS with Vectors ball kick 3x10 reps with single UE support    Gait Training retrowalking in // bars x 2 min    Other Standing Knee Exercises sidestepping x 2 min 5 lbs      Knee/Hip Exercises: Seated   Long Arc Quad Strengthening;Both;3 sets;10 reps    Long Arc Quad Weight 5 lbs.                      PT Short Term Goals - 02/04/21 1611       PT SHORT TERM GOAL #1   Title Patient will be independent with HEP in order to improve functional outcomes.    Time 3    Period Weeks    Status New    Target Date 02/25/21      PT SHORT TERM GOAL #2   Title Patient will report at least 25% improvement in symptoms for improved quality of life.    Time 3    Period Weeks    Status New    Target Date 02/25/21      PT SHORT TERM GOAL #3   Title Demo improved gait tolerance as evidenced by distance of 150 ft during 2MWT    Time 3    Period Weeks    Status New    Target Date 02/25/21               PT Long Term Goals - 02/04/21 1612       PT LONG TERM GOAL #1   Title Patient will report at least 75% improvement in symptoms for improved quality of life.    Time 6    Period Weeks    Status New    Target Date 03/18/21      PT LONG TERM GOAL #2   Title Patient will be able to ambulate at least 226 feet in 2MWT with LRAD in order to demonstrate improved gait speed for community ambulation.    Time 6    Period Weeks    Status New    Target Date 03/18/21      PT LONG TERM GOAL #3   Title Pt will be able to perform 5xSTS in 15 sec or < with no UE support and proper mechanics to demo improved balance and functional BLE strength.    Time 6    Period Weeks    Status New    Target Date 03/18/21  Plan - 03/11/21 1343     Clinical Impression Statement Tolerating tx sessions well and able to demo improved activity tolerance as evidenced by  decrease in need for seated rest periods.  Continues to demo RLE deficits with single limb support with some retro-Trendelenberg in RLe Stance phase. Continued sessions indicated to improve right hip abductor strength and activity tolerance to progress to ambulation with cane    Personal Factors and Comorbidities Comorbidity 1;Time since onset of injury/illness/exacerbation;Past/Current Experience;Comorbidity 2;Fitness    Comorbidities PMH and hx of mutiple physical trauma, Cirrhosis    Examination-Activity Limitations Bathing;Bed Mobility;Bend;Lift;Toileting;Stand;Squat;Locomotion Level;Transfers    Examination-Participation Restrictions Cleaning;Community Activity;Driving;Shop;Occupation;Meal Prep;Yard Work;Volunteer    Stability/Clinical Decision Making Stable/Uncomplicated    Rehab Potential Good    PT Frequency 2x / week    PT Duration 6 weeks    PT Treatment/Interventions ADLs/Self Care Home Management;Aquatic Therapy;Biofeedback;Cryotherapy;Electrical Stimulation;DME Instruction;Ultrasound;Moist Heat;Gait training;Stair training;Functional mobility training;Therapeutic activities;Therapeutic exercise;Balance training;Patient/family education;Neuromuscular re-education;Manual techniques;Taping;Energy conservation;Dry needling;Spinal Manipulations;Joint Manipulations;Compression bandaging;Manual lymph drainage    PT Next Visit Plan Progress Note    PT Home Exercise Plan marching, LAQ, ankle pumps 6/28 sit/stands; 02/21/21: begin walking program    Consulted and Agree with Plan of Care Patient             Patient will benefit from skilled therapeutic intervention in order to improve the following deficits and impairments:  Abnormal gait, Decreased activity tolerance, Decreased balance, Decreased mobility, Decreased knowledge of use of DME, Decreased endurance, Decreased range of motion, Decreased strength, Difficulty walking, Impaired perceived functional ability, Improper body mechanics,  Pain  Visit Diagnosis: Other abnormalities of gait and mobility  Difficulty in walking, not elsewhere classified  Muscle weakness (generalized)     Problem List Patient Active Problem List   Diagnosis Date Noted   Iron deficiency anemia due to chronic blood loss 02/13/2021   History of uterine cancer 02/13/2021   Decompensated hepatic cirrhosis ([place]) 10/25/2020   OSA (obstructive sleep apnea) 10/25/2020   Glossitis 10/25/2020   Grade IV internal hemorrhoids    Acute blood loss anemia 10/08/2020   Hyponatremia 10/08/2020   Rectal bleeding 10/08/2020   Acute respiratory failure with hypoxia ([place]) 09/10/2020   Pneumonia due to COVID-19 virus 09/08/2020   Hip fracture, right ([place]) 09/04/2020   Closed right hip fracture ([place]) 09/02/2020   Diabetes mellitus with hyperglycemia ([place]) 09/02/2020   Hypertension    Thalassemia minor    Pancytopenia ([place])    HLD (hyperlipidemia)    Acute renal failure superimposed on stage 3a chronic kidney disease ([place])    Depression    Fall at home, initial encounter    Nondisplaced fracture of greater trochanter of right femur, initial encounter for closed fracture ([place])    1:47 PM, 03/11/21 M. Sherlyn Lees, PT, DPT Physical Therapist- [place] Office Number: [phone]   [place] [address] [place], [place], [address] Phone: [phone]   Fax:  [phone]  Name: Rachel Huber MRN: [id number] Date of Birth: Nov 12, 1955

## 2021-03-12 ENCOUNTER — Other Ambulatory Visit: Payer: Self-pay | Admitting: Gastroenterology

## 2021-03-12 DIAGNOSIS — K7460 Unspecified cirrhosis of liver: Secondary | ICD-10-CM

## 2021-03-13 ENCOUNTER — Ambulatory Visit (HOSPITAL_COMMUNITY): Payer: Managed Care, Other (non HMO)

## 2021-03-13 ENCOUNTER — Encounter (HOSPITAL_COMMUNITY): Payer: Self-pay

## 2021-03-13 ENCOUNTER — Other Ambulatory Visit: Payer: Self-pay

## 2021-03-13 DIAGNOSIS — M6281 Muscle weakness (generalized): Secondary | ICD-10-CM | POA: Diagnosis not present

## 2021-03-13 DIAGNOSIS — R2689 Other abnormalities of gait and mobility: Secondary | ICD-10-CM

## 2021-03-13 DIAGNOSIS — R262 Difficulty in walking, not elsewhere classified: Secondary | ICD-10-CM

## 2021-03-13 NOTE — Therapy (Signed)
[facility] [address] [place], [place], [address] Phone: [phone]   Fax:  [phone]  Physical Therapy Treatment and Progress Note  Patient Details  Name: Rachel Huber MRN: [id number] Date of Birth: 02-Dec-1955 Referring Provider (PT): Mortimer Fries PA-C  Progress Note Reporting Period 02/04/21 to 03/13/21  See note below for Objective Data and Assessment of Progress/Goals.     Encounter Date: 03/13/2021   PT End of Session - 03/13/21 1513     Visit Number 10    Number of Visits 12    Date for PT Re-Evaluation 03/18/21    Authorization Type [employer]    Authorization Time Period VL based on medical necessity, 60 total    Authorization - Visit Number 13    Authorization - Number of Visits 60    Progress Note Due on Visit 20    PT Start Time 1510    PT Stop Time 1555    PT Time Calculation (min) 45 min    Equipment Utilized During Treatment Gait belt    Activity Tolerance Patient tolerated treatment well;Patient limited by fatigue    Behavior During Therapy WFL for tasks assessed/performed             Past Medical History:  Diagnosis Date   Anxiety    Arthritis    Cirrhosis of liver ([place])    Diabetes mellitus without complication ([place])    Dyspnea    DOE   GERD (gastroesophageal reflux disease)    Hepatitis    PAST   Hypertension    Neuropathy    Neuropathy, diabetic ([place])    Pneumonia    Sinus complaint    Sleep apnea    CPAP   Thalassemia minor 1992    Past Surgical History:  Procedure Laterality Date   ABDOMINAL HYSTERECTOMY     BREAST BIOPSY Right 02/15/2018   [place] bx 6-6:30 ribbon shape, ONE CORE FRAGMENT WITH FIBROSIS. ONE CORE FRAGMENT WITH PORTION OF A DILATED   BREAST BIOPSY Right 02/15/2018   [place] bx 9:00 heart shape, USUAL DUCTAL HYPERPLASIA   BREAST LUMPECTOMY Right 03/09/2018   Procedure: BREAST LUMPECTOMY x 2;  Surgeon: Benjamine Sprague, DO;  Location: [facility] ORS;  Service: General;  Laterality: Right;    CATARACT EXTRACTION W/PHACO Left 06/11/2016   Procedure: CATARACT EXTRACTION PHACO AND INTRAOCULAR LENS PLACEMENT (IOC);  Surgeon: Estill Cotta, MD;  Location: [facility] ORS;  Service: Ophthalmology;  Laterality: Left;  Lot # [id number] H US:01:38.6 AP%:26.4 CDE:44.15   CATARACT EXTRACTION W/PHACO Right 06/15/2018   Procedure: CATARACT EXTRACTION PHACO AND INTRAOCULAR LENS PLACEMENT (IOC);  Surgeon: Birder Robson, MD;  Location: [facility] ORS;  Service: Ophthalmology;  Laterality: Right;  [place] 00:38.2 CDE 4.23 Fluid Pack Lot # [id number]   COLONOSCOPIES     COLONOSCOPY WITH PROPOFOL N/A 10/10/2020   Procedure: COLONOSCOPY WITH PROPOFOL;  Surgeon: Lesly Rubenstein, MD;  Location: [facility] ENDOSCOPY;  Service: Endoscopy;  Laterality: N/A;   COLONOSCOPY WITH PROPOFOL N/A 11/20/2020   Procedure: COLONOSCOPY WITH PROPOFOL;  Surgeon: Lesly Rubenstein, MD;  Location: [facility] ENDOSCOPY;  Service: Endoscopy;  Laterality: N/A;  DM STAT CBC, BMP COVID POSITIVE 09/02/2020   CTR     ESOPHAGOGASTRODUODENOSCOPY (EGD) WITH PROPOFOL N/A 10/10/2020   Procedure: ESOPHAGOGASTRODUODENOSCOPY (EGD) WITH PROPOFOL;  Surgeon: Lesly Rubenstein, MD;  Location: [facility] ENDOSCOPY;  Service: Endoscopy;  Laterality: N/A;  COVID POSITIVE 10/08/2020   JOINT REPLACEMENT     KNEE SURGERY Right 09/24/2017   plates and pins  TOTAL SHOULDER ARTHROPLASTY Right 09/27/2015    There were no vitals filed for this visit.   Subjective Assessment - 03/13/21 1552     Subjective Pt reports 50% improvement since beginning therapy. Notes continued fatigue due to medical conditions    Currently in Pain? No/denies    Pain Score 0-No pain                [facility] PT Assessment - 03/13/21 0001       Assessment   Medical Diagnosis R hand pain, Weakness, Cirrhosis of liver, weakness/balance    Referring Provider (PT) Mortimer Fries PA-C    Onset Date/Surgical Date 09/01/20      Transfers   Five time sit to stand comments  31 sec; 18" seat  height BUE support      Ambulation/Gait   Ambulation/Gait Yes    Ambulation/Gait Assistance 6: Modified independent (Device/Increase time)    Ambulation Distance (Feet) 174 Feet    Assistive device Rolling walker    Gait Pattern Step-through pattern    Ambulation Surface Level;Indoor    Gait velocity decreased    Gait Comments 2MWT                           [facility] Adult PT Treatment/Exercise - 03/13/21 0001       Knee/Hip Exercises: Supine   Short Arc Quad Sets Strengthening;Both;3 sets;10 reps    Short Arc Quad Sets Limitations 3#    Straight Leg Raises Strengthening;Both;3 sets;10 reps                    PT Education - 03/13/21 1552     Education Details education on CLOF vs [place]    Person(s) Educated Patient    Methods Explanation    Comprehension Verbalized understanding              PT Short Term Goals - 03/13/21 1536       PT SHORT TERM GOAL #1   Title Patient will be independent with HEP in order to improve functional outcomes.    Baseline in development    Time 3    Period Weeks    Status On-going    Target Date 02/25/21      PT SHORT TERM GOAL #2   Title Patient will report at least 25% improvement in symptoms for improved quality of life.    Baseline 50% improvement    Time 3    Period Weeks    Status Achieved    Target Date 02/25/21      PT SHORT TERM GOAL #3   Title Demo improved gait tolerance as evidenced by distance of 150 ft during 2MWT    Baseline 174 with RW    Time 3    Period Weeks    Status Achieved    Target Date 02/25/21               PT Long Term Goals - 03/13/21 1538       PT LONG TERM GOAL #1   Title Patient will report at least 75% improvement in symptoms for improved quality of life.    Baseline 50% improvement    Time 6    Period Weeks    Status On-going      PT LONG TERM GOAL #2   Title Patient will be able to ambulate at least 226 feet in 2MWT with LRAD in order to demonstrate  improved  gait speed for community ambulation.    Baseline 174 with RW    Time 6    Period Weeks    Status On-going      PT LONG TERM GOAL #3   Title Pt will be able to perform 5xSTS in 15 sec or < with no UE support and proper mechanics to demo improved balance and functional BLE strength.    Baseline 31 sec from 18" seat height with BUE support    Time 6    Period Weeks    Status On-going    Target Date 03/18/21                   Plan - 03/13/21 1553     Clinical Impression Statement Pt tolerating tx sessions well and reports 50% improvement in her activity tolerance and mobility. Demonstrates improved gait velocity as evidenced by 2MWT. Continues to exhibit BLE weakness as evidenced by difficulty with SLR able to perform 5 reps without resistance before fatigue. COntinued POC indicated to improve strength, balance, and activity tolerance to reduce risk for falls with mobility    Personal Factors and Comorbidities Comorbidity 1;Time since onset of injury/illness/exacerbation;Past/Current Experience;Comorbidity 2;Fitness    Comorbidities PMH and hx of mutiple physical trauma, Cirrhosis    Examination-Activity Limitations Bathing;Bed Mobility;Bend;Lift;Toileting;Stand;Squat;Locomotion Level;Transfers    Examination-Participation Restrictions Cleaning;Community Activity;Driving;Shop;Occupation;Meal Prep;Yard Work;Volunteer    Stability/Clinical Decision Making Stable/Uncomplicated    Rehab Potential Good    PT Frequency 2x / week    PT Duration 6 weeks    PT Treatment/Interventions ADLs/Self Care Home Management;Aquatic Therapy;Biofeedback;Cryotherapy;Electrical Stimulation;DME Instruction;Ultrasound;Moist Heat;Gait training;Stair training;Functional mobility training;Therapeutic activities;Therapeutic exercise;Balance training;Patient/family education;Neuromuscular re-education;Manual techniques;Taping;Energy conservation;Dry needling;Spinal Manipulations;Joint  Manipulations;Compression bandaging;Manual lymph drainage    PT Next Visit Plan Progress Note    PT Home Exercise Plan marching, LAQ, ankle pumps 6/28 sit/stands; 02/21/21: begin walking program    Consulted and Agree with Plan of Care Patient             Patient will benefit from skilled therapeutic intervention in order to improve the following deficits and impairments:  Abnormal gait, Decreased activity tolerance, Decreased balance, Decreased mobility, Decreased knowledge of use of DME, Decreased endurance, Decreased range of motion, Decreased strength, Difficulty walking, Impaired perceived functional ability, Improper body mechanics, Pain  Visit Diagnosis: Other abnormalities of gait and mobility  Difficulty in walking, not elsewhere classified  Muscle weakness (generalized)     Problem List Patient Active Problem List   Diagnosis Date Noted   Iron deficiency anemia due to chronic blood loss 02/13/2021   History of uterine cancer 02/13/2021   Decompensated hepatic cirrhosis ([place]) 10/25/2020   OSA (obstructive sleep apnea) 10/25/2020   Glossitis 10/25/2020   Grade IV internal hemorrhoids    Acute blood loss anemia 10/08/2020   Hyponatremia 10/08/2020   Rectal bleeding 10/08/2020   Acute respiratory failure with hypoxia ([place]) 09/10/2020   Pneumonia due to COVID-19 virus 09/08/2020   Hip fracture, right ([place]) 09/04/2020   Closed right hip fracture ([place]) 09/02/2020   Diabetes mellitus with hyperglycemia ([place]) 09/02/2020   Hypertension    Thalassemia minor    Pancytopenia ([place])    HLD (hyperlipidemia)    Acute renal failure superimposed on stage 3a chronic kidney disease ([place])    Depression    Fall at home, initial encounter    Nondisplaced fracture of greater trochanter of right femur, initial encounter for closed fracture ([place])    3:59 PM, 03/13/21 M. Sherlyn Lees, PT, DPT Physical Therapist-  [place] Office Number: [place] [address] [place], [place], [address] Phone: [phone]   Fax:  [phone]  Name: Rachel Huber MRN: [id number] Date of Birth: Dec 20, 1955

## 2021-03-14 ENCOUNTER — Ambulatory Visit
Admission: RE | Admit: 2021-03-14 | Discharge: 2021-03-14 | Disposition: A | Discharge status: Home / Self Care | Payer: Managed Care, Other (non HMO) | Source: Ambulatory Visit | Attending: Gastroenterology | Admitting: Gastroenterology

## 2021-03-14 DIAGNOSIS — K7460 Unspecified cirrhosis of liver: Secondary | ICD-10-CM | POA: Diagnosis present

## 2021-03-14 LAB — BODY FLUID CELL COUNT WITH DIFFERENTIAL
Eos, Fluid: 0 %
Lymphs, Fluid: 6 %
Monocyte-Macrophage-Serous Fluid: 11 %
Neutrophil Count, Fluid: 83 %
Total Nucleated Cell Count, Fluid: 1335 cu mm

## 2021-03-14 LAB — ALBUMIN, PLEURAL OR PERITONEAL FLUID: Albumin, Fluid: 1.4 g/dL

## 2021-03-14 LAB — PROTEIN, PLEURAL OR PERITONEAL FLUID: Total protein, fluid: <3 g/dL

## 2021-03-14 MED ORDER — ALBUMIN HUMAN 25 % IV SOLN: 25.0000 g | Freq: Once | INTRAVENOUS | Status: AC

## 2021-03-14 MED ORDER — ALBUMIN HUMAN 25 % IV SOLN
INTRAVENOUS | Status: AC
  Administered 2021-03-14: 25 g via INTRAVENOUS
  Filled 2021-03-14: qty 100

## 2021-03-14 MED ORDER — ALBUMIN HUMAN 25 % IV SOLN
25.0000 g | Freq: Once | INTRAVENOUS | Status: AC
  Administered 2021-03-14: 25 g via INTRAVENOUS

## 2021-03-14 NOTE — Procedures (Signed)
Pre Procedural Dx: Symptomatic Ascites Post Procedural Dx: Same  Successful US guided paracentesis yielding 9.8 L of serous ascitic fluid.  EBL: None  Complications: None immediate  Ronny Bacon, MD Pager #: [phone]

## 2021-03-15 LAB — PROTEIN, BODY FLUID (OTHER): Total Protein, Body Fluid Other: 2.4 g/dL

## 2021-03-18 ENCOUNTER — Other Ambulatory Visit: Payer: Self-pay

## 2021-03-18 ENCOUNTER — Ambulatory Visit (HOSPITAL_COMMUNITY): Payer: 59 | Attending: Internal Medicine

## 2021-03-18 ENCOUNTER — Encounter (HOSPITAL_COMMUNITY): Payer: Self-pay | Admitting: Hematology and Oncology

## 2021-03-18 DIAGNOSIS — M6281 Muscle weakness (generalized): Secondary | ICD-10-CM | POA: Diagnosis present

## 2021-03-18 DIAGNOSIS — R2689 Other abnormalities of gait and mobility: Secondary | ICD-10-CM | POA: Diagnosis not present

## 2021-03-18 DIAGNOSIS — R262 Difficulty in walking, not elsewhere classified: Secondary | ICD-10-CM | POA: Diagnosis present

## 2021-03-18 LAB — BODY FLUID CULTURE W GRAM STAIN: Culture: NO GROWTH

## 2021-03-18 LAB — CYTOLOGY - NON PAP

## 2021-03-18 NOTE — Therapy (Addendum)
[place], [place], [address] Phone: [phone]   Fax:  [phone]  Physical Therapy Treatment and Recertification  Patient Details  Name: Rachel Huber MRN: [id number] Date of Birth: 12-28-1955 Referring Provider (PT): Mortimer Fries PA-C   Encounter Date: 03/18/2021   PT End of Session - 03/18/21 1444     Visit Number 11    Number of Visits 24    Date for PT Re-Evaluation 04/29/21    Authorization Type [employer]    Authorization Time Period VL based on medical necessity, 60 total    Authorization - Visit Number 14    Authorization - Number of Visits 60    Progress Note Due on Visit 20    PT Start Time 1436    PT Stop Time 1515    PT Time Calculation (min) 39 min    Equipment Utilized During Treatment Gait belt    Activity Tolerance Patient tolerated treatment well;Patient limited by fatigue    Behavior During Therapy WFL for tasks assessed/performed             Past Medical History:  Diagnosis Date   Anxiety    Arthritis    Cirrhosis of liver ([place])    Diabetes mellitus without complication ([place])    Dyspnea    DOE   GERD (gastroesophageal reflux disease)    Hepatitis    PAST   Hypertension    Neuropathy    Neuropathy, diabetic ([place])    Pneumonia    Sinus complaint    Sleep apnea    CPAP   Thalassemia minor 1992    Past Surgical History:  Procedure Laterality Date   ABDOMINAL HYSTERECTOMY     BREAST BIOPSY Right 02/15/2018   [place] bx 6-6:30 ribbon shape, ONE CORE FRAGMENT WITH FIBROSIS. ONE CORE FRAGMENT WITH PORTION OF A DILATED   BREAST BIOPSY Right 02/15/2018   [place] bx 9:00 heart shape, USUAL DUCTAL HYPERPLASIA   BREAST LUMPECTOMY Right 03/09/2018   Procedure: BREAST LUMPECTOMY x 2;  Surgeon: Benjamine Sprague, DO;  Location: [facility] ORS;  Service: General;  Laterality: Right;   CATARACT EXTRACTION W/PHACO Left 06/11/2016   Procedure: CATARACT EXTRACTION PHACO AND INTRAOCULAR LENS PLACEMENT (IOC);   Surgeon: Estill Cotta, MD;  Location: [facility] ORS;  Service: Ophthalmology;  Laterality: Left;  Lot # [id number] H US:01:38.6 AP%:26.4 CDE:44.15   CATARACT EXTRACTION W/PHACO Right 06/15/2018   Procedure: CATARACT EXTRACTION PHACO AND INTRAOCULAR LENS PLACEMENT (IOC);  Surgeon: Birder Robson, MD;  Location: [facility] ORS;  Service: Ophthalmology;  Laterality: Right;  [place] 00:38.2 CDE 4.23 Fluid Pack Lot # [id number]   COLONOSCOPIES     COLONOSCOPY WITH PROPOFOL N/A 10/10/2020   Procedure: COLONOSCOPY WITH PROPOFOL;  Surgeon: Lesly Rubenstein, MD;  Location: [facility] ENDOSCOPY;  Service: Endoscopy;  Laterality: N/A;   COLONOSCOPY WITH PROPOFOL N/A 11/20/2020   Procedure: COLONOSCOPY WITH PROPOFOL;  Surgeon: Lesly Rubenstein, MD;  Location: [facility] ENDOSCOPY;  Service: Endoscopy;  Laterality: N/A;  DM STAT CBC, BMP COVID POSITIVE 09/02/2020   CTR     ESOPHAGOGASTRODUODENOSCOPY (EGD) WITH PROPOFOL N/A 10/10/2020   Procedure: ESOPHAGOGASTRODUODENOSCOPY (EGD) WITH PROPOFOL;  Surgeon: Lesly Rubenstein, MD;  Location: [facility] ENDOSCOPY;  Service: Endoscopy;  Laterality: N/A;  COVID POSITIVE 10/08/2020   JOINT REPLACEMENT     KNEE SURGERY Right 09/24/2017   plates and pins   TOTAL SHOULDER ARTHROPLASTY Right 09/27/2015    There were no vitals filed for this visit.   Subjective Assessment -  03/18/21 1448     Subjective Pt reports feeling some soreness along left flank from recent paracentisis                [facility] PT Assessment - 03/18/21 0001       Assessment   Medical Diagnosis R hand pain, Weakness, Cirrhosis of liver, weakness/balance    Referring Provider (PT) Mortimer Fries PA-C    Onset Date/Surgical Date 09/01/20                           [facility] Adult PT Treatment/Exercise - 03/18/21 0001       Knee/Hip Exercises: Standing   Knee Flexion Strengthening;Both;4 sets;10 reps    Knee Flexion Limitations 5#    Other Standing Knee Exercises sidestepping 2x 2 min 5 lbs       Knee/Hip Exercises: Seated   Long Arc Quad Strengthening;Both;5 sets;10 reps    Long Arc Quad Weight 5 lbs.                    PT Education - 03/18/21 1455     Education Details Discussion regarding CLOF and progress with POC details. Pt expresses interest in continuing PT services to improve BLE strength and functional activity tolerance.    Person(s) Educated Patient    Methods Explanation    Comprehension Verbalized understanding              PT Short Term Goals - 03/18/21 1453       PT SHORT TERM GOAL #1   Title Patient will be independent with HEP in order to improve functional outcomes.    Baseline in development    Time 3    Period Weeks    Status On-going    Target Date 04/08/21      PT SHORT TERM GOAL #2   Title Patient will report at least 25% improvement in symptoms for improved quality of life.    Baseline 50% improvement    Time 3    Period Weeks    Status Achieved    Target Date 02/25/21      PT SHORT TERM GOAL #3   Title Demo improved gait tolerance as evidenced by distance of 150 ft during 2MWT    Baseline 174 with RW    Time 3    Period Weeks    Status Achieved    Target Date 02/25/21               PT Long Term Goals - 03/18/21 1453       PT LONG TERM GOAL #1   Title Patient will report at least 75% improvement in symptoms for improved quality of life.    Baseline 50% improvement    Time 6    Period Weeks    Status On-going    Target Date 04/29/21      PT LONG TERM GOAL #2   Title Patient will be able to ambulate at least 226 feet in 2MWT with LRAD in order to demonstrate improved gait speed for community ambulation.    Baseline 174 with RW    Time 6    Period Weeks    Status On-going    Target Date 04/29/21      PT LONG TERM GOAL #3   Title Pt will be able to perform 5xSTS in 15 sec or < with no UE support and proper mechanics to demo improved balance and functional BLE  strength.    Baseline 31 sec from 18" seat  height with BUE support    Time 6    Period Weeks    Status On-going    Target Date 04/29/21                   Plan - 03/18/21 1456     Clinical Impression Statement Progressing with POC details overall and demonstrating improved functional status at this time. Continues to exhibit gait/balance deficits with hight risk for falls and activity tolerance limitations as compared to PLOF. Progress slower than anticipated due to medical complexity and concommittant issues affecting her. Continued sessions indicated to progress POC and improve gait, balance, and functional activity tolerance to reduce need for RW and reduce risk for falls    Personal Factors and Comorbidities Comorbidity 1;Time since onset of injury/illness/exacerbation;Past/Current Experience;Comorbidity 2;Fitness    Comorbidities PMH and hx of mutiple physical trauma, Cirrhosis    Examination-Activity Limitations Bathing;Bed Mobility;Bend;Lift;Toileting;Stand;Squat;Locomotion Level;Transfers    Examination-Participation Restrictions Cleaning;Community Activity;Driving;Shop;Occupation;Meal Prep;Yard Work;Volunteer    Stability/Clinical Decision Making Stable/Uncomplicated    Rehab Potential Good    PT Frequency 2x / week    PT Duration 6 weeks    PT Treatment/Interventions ADLs/Self Care Home Management;Aquatic Therapy;Biofeedback;Cryotherapy;Electrical Stimulation;DME Instruction;Ultrasound;Moist Heat;Gait training;Stair training;Functional mobility training;Therapeutic activities;Therapeutic exercise;Balance training;Patient/family education;Neuromuscular re-education;Manual techniques;Taping;Energy conservation;Dry needling;Spinal Manipulations;Joint Manipulations;Compression bandaging;Manual lymph drainage    PT Next Visit Plan continue with general strength, hip abductor, core    PT Home Exercise Plan marching, LAQ, ankle pumps 6/28 sit/stands; 02/21/21: begin walking program    Consulted and Agree with Plan of Care  Patient             Patient will benefit from skilled therapeutic intervention in order to improve the following deficits and impairments:  Abnormal gait, Decreased activity tolerance, Decreased balance, Decreased mobility, Decreased knowledge of use of DME, Decreased endurance, Decreased range of motion, Decreased strength, Difficulty walking, Impaired perceived functional ability, Improper body mechanics, Pain  Visit Diagnosis: Other abnormalities of gait and mobility  Difficulty in walking, not elsewhere classified  Muscle weakness (generalized)     Problem List Patient Active Problem List   Diagnosis Date Noted   Iron deficiency anemia due to chronic blood loss 02/13/2021   History of uterine cancer 02/13/2021   Decompensated hepatic cirrhosis ([place]) 10/25/2020   OSA (obstructive sleep apnea) 10/25/2020   Glossitis 10/25/2020   Grade IV internal hemorrhoids    Acute blood loss anemia 10/08/2020   Hyponatremia 10/08/2020   Rectal bleeding 10/08/2020   Acute respiratory failure with hypoxia ([place]) 09/10/2020   Pneumonia due to COVID-19 virus 09/08/2020   Hip fracture, right ([place]) 09/04/2020   Closed right hip fracture ([place]) 09/02/2020   Diabetes mellitus with hyperglycemia ([place]) 09/02/2020   Hypertension    Thalassemia minor    Pancytopenia ([place])    HLD (hyperlipidemia)    Acute renal failure superimposed on stage 3a chronic kidney disease ([place])    Depression    Fall at home, initial encounter    Nondisplaced fracture of greater trochanter of right femur, initial encounter for closed fracture ([place])    3:26 PM, 03/18/21 M. Sherlyn Lees, PT, DPT Physical Therapist- [place] Office Number: [phone]   [place] [address] [place], [place], [address] Phone: [phone]   Fax:  [phone]  Name: Rachel Huber MRN: [id number] Date of Birth: 05/11/1956

## 2021-03-19 NOTE — Addendum Note (Signed)
Addended by: Toniann Fail on: 03/19/2021 03:21 PM   Modules accepted: Orders

## 2021-03-20 ENCOUNTER — Telehealth: Payer: Self-pay | Admitting: Gastroenterology

## 2021-03-20 ENCOUNTER — Encounter (HOSPITAL_COMMUNITY): Payer: Managed Care, Other (non HMO)

## 2021-03-20 DIAGNOSIS — K7460 Unspecified cirrhosis of liver: Secondary | ICD-10-CM

## 2021-03-20 DIAGNOSIS — R188 Other ascites: Secondary | ICD-10-CM

## 2021-03-20 NOTE — Telephone Encounter (Signed)
Ordered labs for her to get with her next set of labs drawn at [facility]

## 2021-03-22 ENCOUNTER — Ambulatory Visit (HOSPITAL_COMMUNITY): Payer: 59

## 2021-04-04 ENCOUNTER — Encounter (HOSPITAL_COMMUNITY): Payer: Self-pay | Admitting: Hematology and Oncology

## 2021-04-11 ENCOUNTER — Ambulatory Visit (HOSPITAL_COMMUNITY): Payer: 59

## 2021-04-11 ENCOUNTER — Encounter (HOSPITAL_COMMUNITY): Payer: Self-pay

## 2021-04-11 ENCOUNTER — Other Ambulatory Visit: Payer: Self-pay

## 2021-04-11 DIAGNOSIS — R262 Difficulty in walking, not elsewhere classified: Secondary | ICD-10-CM

## 2021-04-11 DIAGNOSIS — R2689 Other abnormalities of gait and mobility: Secondary | ICD-10-CM

## 2021-04-11 DIAGNOSIS — M6281 Muscle weakness (generalized): Secondary | ICD-10-CM

## 2021-04-11 NOTE — Therapy (Signed)
[place], [place], [address] Phone: [phone]   Fax:  [phone]  Physical Therapy Treatment  Patient Details  Name: Rachel Huber MRN: [id number] Date of Birth: March 30, 1956 Referring Provider (PT): Mortimer Fries PA-C   Encounter Date: 04/11/2021   PT End of Session - 04/11/21 1102     Visit Number 12    Number of Visits 24    Date for PT Re-Evaluation 04/29/21    Authorization Type [employer]    Authorization Time Period VL based on medical necessity, 60 total    Authorization - Visit Number 15    Authorization - Number of Visits 60    Progress Note Due on Visit 20    PT Start Time 1043    PT Stop Time 1127    PT Time Calculation (min) 44 min    Equipment Utilized During Treatment Gait belt   SBA during 2MWT wiht RW   Activity Tolerance Patient tolerated treatment well;Patient limited by fatigue    Behavior During Therapy WFL for tasks assessed/performed             Past Medical History:  Diagnosis Date   Anxiety    Arthritis    Cirrhosis of liver ([place])    Diabetes mellitus without complication ([place])    Dyspnea    DOE   GERD (gastroesophageal reflux disease)    Hepatitis    PAST   Hypertension    Neuropathy    Neuropathy, diabetic ([place])    Pneumonia    Sinus complaint    Sleep apnea    CPAP   Thalassemia minor 1992    Past Surgical History:  Procedure Laterality Date   ABDOMINAL HYSTERECTOMY     BREAST BIOPSY Right 02/15/2018   [place] bx 6-6:30 ribbon shape, ONE CORE FRAGMENT WITH FIBROSIS. ONE CORE FRAGMENT WITH PORTION OF A DILATED   BREAST BIOPSY Right 02/15/2018   [place] bx 9:00 heart shape, USUAL DUCTAL HYPERPLASIA   BREAST LUMPECTOMY Right 03/09/2018   Procedure: BREAST LUMPECTOMY x 2;  Surgeon: Benjamine Sprague, DO;  Location: [facility] ORS;  Service: General;  Laterality: Right;   CATARACT EXTRACTION W/PHACO Left 06/11/2016   Procedure: CATARACT EXTRACTION PHACO AND INTRAOCULAR LENS PLACEMENT  (IOC);  Surgeon: Estill Cotta, MD;  Location: [facility] ORS;  Service: Ophthalmology;  Laterality: Left;  Lot # [id number] H US:01:38.6 AP%:26.4 CDE:44.15   CATARACT EXTRACTION W/PHACO Right 06/15/2018   Procedure: CATARACT EXTRACTION PHACO AND INTRAOCULAR LENS PLACEMENT (IOC);  Surgeon: Birder Robson, MD;  Location: [facility] ORS;  Service: Ophthalmology;  Laterality: Right;  [place] 00:38.2 CDE 4.23 Fluid Pack Lot # [id number]   COLONOSCOPIES     COLONOSCOPY WITH PROPOFOL N/A 10/10/2020   Procedure: COLONOSCOPY WITH PROPOFOL;  Surgeon: Lesly Rubenstein, MD;  Location: [facility] ENDOSCOPY;  Service: Endoscopy;  Laterality: N/A;   COLONOSCOPY WITH PROPOFOL N/A 11/20/2020   Procedure: COLONOSCOPY WITH PROPOFOL;  Surgeon: Lesly Rubenstein, MD;  Location: [facility] ENDOSCOPY;  Service: Endoscopy;  Laterality: N/A;  DM STAT CBC, BMP COVID POSITIVE 09/02/2020   CTR     ESOPHAGOGASTRODUODENOSCOPY (EGD) WITH PROPOFOL N/A 10/10/2020   Procedure: ESOPHAGOGASTRODUODENOSCOPY (EGD) WITH PROPOFOL;  Surgeon: Lesly Rubenstein, MD;  Location: [facility] ENDOSCOPY;  Service: Endoscopy;  Laterality: N/A;  COVID POSITIVE 10/08/2020   JOINT REPLACEMENT     KNEE SURGERY Right 09/24/2017   plates and pins   TOTAL SHOULDER ARTHROPLASTY Right 09/27/2015    There were no vitals filed for this visit.  Subjective Assessment - 04/11/21 1043     Subjective Feels unsteady on feet.  Reports some pain Lt flank following fall ~3 weeks ago, reports she feels better today.    Patient Stated Goals improve walking    Currently in Pain? Yes    Pain Score 2     Pain Location Flank    Pain Orientation Left    Pain Descriptors / Indicators Aching;Sore    Pain Type Acute pain                               [facility] Adult PT Treatment/Exercise - 04/11/21 0001       Ambulation/Gait   Ambulation/Gait Yes    Ambulation/Gait Assistance 6: Modified independent (Device/Increase time)    Ambulation Distance (Feet) 226 Feet     Assistive device Rolling walker    Gait Pattern Step-through pattern    Ambulation Surface Level;Indoor    Gait velocity decreased    Gait Comments 2MWT      Knee/Hip Exercises: Standing   Hip Abduction Stengthening;10 reps;Knee straight;2 sets    Abduction Limitations RTB around thigh 5" holds    Hip Extension Both;2 sets;10 reps;Knee straight    Extension Limitations 5" holds    Functional Squat 2 sets;5 reps    Functional Squat Limitations minisquats    Other Standing Knee Exercises sidestep 2RT RTB around thigh      Knee/Hip Exercises: Seated   Sit to Sand 5 reps;without UE support;2 sets   eccentic control with 3 pause descending; 2 sets; elevated height with 1 foam                     PT Short Term Goals - 03/18/21 1453       PT SHORT TERM GOAL #1   Title Patient will be independent with HEP in order to improve functional outcomes.    Baseline in development    Time 3    Period Weeks    Status On-going    Target Date 04/08/21      PT SHORT TERM GOAL #2   Title Patient will report at least 25% improvement in symptoms for improved quality of life.    Baseline 50% improvement    Time 3    Period Weeks    Status Achieved    Target Date 02/25/21      PT SHORT TERM GOAL #3   Title Demo improved gait tolerance as evidenced by distance of 150 ft during 2MWT    Baseline 174 with RW    Time 3    Period Weeks    Status Achieved    Target Date 02/25/21               PT Long Term Goals - 03/18/21 1453       PT LONG TERM GOAL #1   Title Patient will report at least 75% improvement in symptoms for improved quality of life.    Baseline 50% improvement    Time 6    Period Weeks    Status On-going    Target Date 04/29/21      PT LONG TERM GOAL #2   Title Patient will be able to ambulate at least 226 feet in 2MWT with LRAD in order to demonstrate improved gait speed for community ambulation.    Baseline 174 with RW    Time 6    Period Weeks  Status On-going    Target Date 04/29/21      PT LONG TERM GOAL #3   Title Pt will be able to perform 5xSTS in 15 sec or < with no UE support and proper mechanics to demo improved balance and functional BLE strength.    Baseline 31 sec from 18" seat height with BUE support    Time 6    Period Weeks    Status On-going    Target Date 04/29/21                   Plan - 04/11/21 1116     Clinical Impression Statement Pt progressing towards POC well.  Increased cadence with 2MWT, safe mechanics with no LOB.  Added controlled STS with 3 pauses to chair, required elevated height to complete without hands.  Pt required periodic rest breaks this session due to fatigue, no reoprts of pain through session.  Intermittent HHA required with static balance activities.    Personal Factors and Comorbidities Comorbidity 1;Time since onset of injury/illness/exacerbation;Past/Current Experience;Comorbidity 2;Fitness    Comorbidities PMH and hx of mutiple physical trauma, Cirrhosis    Examination-Activity Limitations Bathing;Bed Mobility;Bend;Lift;Toileting;Stand;Squat;Locomotion Level;Transfers    Examination-Participation Restrictions Cleaning;Community Activity;Driving;Shop;Occupation;Meal Prep;Yard Work;Volunteer    Stability/Clinical Decision Making Stable/Uncomplicated    Clinical Decision Making Low    Rehab Potential Good    PT Frequency 2x / week    PT Duration 6 weeks    PT Treatment/Interventions ADLs/Self Care Home Management;Aquatic Therapy;Biofeedback;Cryotherapy;Electrical Stimulation;DME Instruction;Ultrasound;Moist Heat;Gait training;Stair training;Functional mobility training;Therapeutic activities;Therapeutic exercise;Balance training;Patient/family education;Neuromuscular re-education;Manual techniques;Taping;Energy conservation;Dry needling;Spinal Manipulations;Joint Manipulations;Compression bandaging;Manual lymph drainage    PT Next Visit Plan continue with general strength, hip  abductor, core    PT Home Exercise Plan marching, LAQ, ankle pumps 6/28 sit/stands; 02/21/21: begin walking program    Consulted and Agree with Plan of Care Patient             Patient will benefit from skilled therapeutic intervention in order to improve the following deficits and impairments:  Abnormal gait, Decreased activity tolerance, Decreased balance, Decreased mobility, Decreased knowledge of use of DME, Decreased endurance, Decreased range of motion, Decreased strength, Difficulty walking, Impaired perceived functional ability, Improper body mechanics, Pain  Visit Diagnosis: Difficulty in walking, not elsewhere classified  Other abnormalities of gait and mobility  Muscle weakness (generalized)     Problem List Patient Active Problem List   Diagnosis Date Noted   Iron deficiency anemia due to chronic blood loss 02/13/2021   History of uterine cancer 02/13/2021   Decompensated hepatic cirrhosis ([place]) 10/25/2020   OSA (obstructive sleep apnea) 10/25/2020   Glossitis 10/25/2020   Grade IV internal hemorrhoids    Acute blood loss anemia 10/08/2020   Hyponatremia 10/08/2020   Rectal bleeding 10/08/2020   Acute respiratory failure with hypoxia ([place]) 09/10/2020   Pneumonia due to COVID-19 virus 09/08/2020   Hip fracture, right ([facility]) 09/04/2020   Closed right hip fracture ([place]) 09/02/2020   Diabetes mellitus with hyperglycemia ([place]) 09/02/2020   Hypertension    Thalassemia minor    Pancytopenia ([place])    HLD (hyperlipidemia)    Acute renal failure superimposed on stage 3a chronic kidney disease ([place])    Depression    Fall at home, initial encounter    Nondisplaced fracture of greater trochanter of right femur, initial encounter for closed fracture [facility])    Ihor Austin, LPTA/CLT; CBIS [phone]  Aldona Lento 04/11/2021, 11:55 AM  [address]  [place], [place], [address] Phone: [phone]   Fax:   [phone]  Name: Rachel Huber MRN: [id number] Date of Birth: 1956/05/26

## 2021-04-12 ENCOUNTER — Other Ambulatory Visit: Payer: Self-pay | Admitting: Gastroenterology

## 2021-04-12 ENCOUNTER — Ambulatory Visit (HOSPITAL_COMMUNITY): Payer: 59 | Admitting: Physical Therapy

## 2021-04-12 DIAGNOSIS — R2689 Other abnormalities of gait and mobility: Secondary | ICD-10-CM

## 2021-04-12 DIAGNOSIS — R262 Difficulty in walking, not elsewhere classified: Secondary | ICD-10-CM

## 2021-04-12 DIAGNOSIS — K652 Spontaneous bacterial peritonitis: Secondary | ICD-10-CM

## 2021-04-12 DIAGNOSIS — M6281 Muscle weakness (generalized): Secondary | ICD-10-CM

## 2021-04-12 DIAGNOSIS — K7460 Unspecified cirrhosis of liver: Secondary | ICD-10-CM

## 2021-04-12 NOTE — Therapy (Signed)
[place], [place], [address] Phone: [phone]   Fax:  [phone]  Physical Therapy Treatment  Patient Details  Name: Rachel Huber MRN: [id number] Date of Birth: 1956-02-25 Referring Provider (PT): Mortimer Fries PA-C   Encounter Date: 04/12/2021   PT End of Session - 04/12/21 1313     Visit Number 13    Number of Visits 24    Date for PT Re-Evaluation 04/29/21    Authorization Type [employer]    Authorization Time Period VL based on medical necessity, 60 total    Authorization - Visit Number 16    Authorization - Number of Visits 60    Progress Note Due on Visit 20    PT Start Time 1318    PT Stop Time 1400    PT Time Calculation (min) 42 min             Past Medical History:  Diagnosis Date   Anxiety    Arthritis    Cirrhosis of liver ([place])    Diabetes mellitus without complication ([place])    Dyspnea    DOE   GERD (gastroesophageal reflux disease)    Hepatitis    PAST   Hypertension    Neuropathy    Neuropathy, diabetic ([place])    Pneumonia    Sinus complaint    Sleep apnea    CPAP   Thalassemia minor 1992    Past Surgical History:  Procedure Laterality Date   ABDOMINAL HYSTERECTOMY     BREAST BIOPSY Right 02/15/2018   [place] bx 6-6:30 ribbon shape, ONE CORE FRAGMENT WITH FIBROSIS. ONE CORE FRAGMENT WITH PORTION OF A DILATED   BREAST BIOPSY Right 02/15/2018   [place] bx 9:00 heart shape, USUAL DUCTAL HYPERPLASIA   BREAST LUMPECTOMY Right 03/09/2018   Procedure: BREAST LUMPECTOMY x 2;  Surgeon: Benjamine Sprague, DO;  Location: [facility] ORS;  Service: General;  Laterality: Right;   CATARACT EXTRACTION W/PHACO Left 06/11/2016   Procedure: CATARACT EXTRACTION PHACO AND INTRAOCULAR LENS PLACEMENT (IOC);  Surgeon: Estill Cotta, MD;  Location: [facility] ORS;  Service: Ophthalmology;  Laterality: Left;  Lot # [id number] H US:01:38.6 AP%:26.4 CDE:44.15   CATARACT EXTRACTION W/PHACO Right 06/15/2018   Procedure:  CATARACT EXTRACTION PHACO AND INTRAOCULAR LENS PLACEMENT (IOC);  Surgeon: Birder Robson, MD;  Location: [facility];  Service: Ophthalmology;  Laterality: Right;  [place] 00:38.2 CDE 4.23 Fluid Pack Lot # [id number]   COLONOSCOPIES     COLONOSCOPY WITH PROPOFOL N/A 10/10/2020   Procedure: COLONOSCOPY WITH PROPOFOL;  Surgeon: Lesly Rubenstein, MD;  Location: [facility] ENDOSCOPY;  Service: Endoscopy;  Laterality: N/A;   COLONOSCOPY WITH PROPOFOL N/A 11/20/2020   Procedure: COLONOSCOPY WITH PROPOFOL;  Surgeon: Lesly Rubenstein, MD;  Location: [facility] ENDOSCOPY;  Service: Endoscopy;  Laterality: N/A;  DM STAT CBC, BMP COVID POSITIVE 09/02/2020   CTR     ESOPHAGOGASTRODUODENOSCOPY (EGD) WITH PROPOFOL N/A 10/10/2020   Procedure: ESOPHAGOGASTRODUODENOSCOPY (EGD) WITH PROPOFOL;  Surgeon: Lesly Rubenstein, MD;  Location: [facility] ENDOSCOPY;  Service: Endoscopy;  Laterality: N/A;  COVID POSITIVE 10/08/2020   JOINT REPLACEMENT     KNEE SURGERY Right 09/24/2017   plates and pins   TOTAL SHOULDER ARTHROPLASTY Right 09/27/2015    There were no vitals filed for this visit.   Subjective Assessment - 04/12/21 1320     Subjective PT states that she got to the point where she was using the cane then she bent down to get something off the floor and  fell.    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    Leisure spending time with grandkids      Cognition   Overall Cognitive Status Within Functional Limits for  tasks assessed      Observation/Other Assessments   Observations Ambulates with RW, slow, labored transfers    Focus on Therapeutic Outcomes (FOTO)  n/a      Sensation   Light Touch Appears Intact      Strength   Right Hip Flexion 5/5   was 3-   Right Hip Extension 4/5    Right Hip ABduction 3+/5    Left Hip Flexion 4+/5   was 3-   Left Hip Extension 4/5    Left Hip ABduction 4-/5    Right Knee Flexion 4/5    Right Knee Extension 5/5   was 4/5   Left Knee Flexion 4/5    Left Knee Extension 5/5   was 4/5   Right Ankle Dorsiflexion 4+/5   was 4+   Left Ankle Dorsiflexion 4/5   was 4/5     Transfers   Comments --      Ambulation/Gait   Ambulation/Gait --    Ambulation/Gait Assistance --    Ambulation Distance (Feet) 113 Feet    Assistive device Straight cane    Gait Pattern --                           [facility] Adult PT Treatment/Exercise - 04/12/21 0001       Ambulation/Gait   Gait Comments 2MWT, slow, labored cadence with RW   continued to walk for 6:45     Knee/Hip Exercises: Standing   SLS Rt 7" LT 3 x 5      Knee/Hip Exercises: Seated   Sit to Sand 5 reps;without UE support;2 sets              ankle DF seated with Tband B x 10  SL abduction 10x B           PT Short Term Goals - 04/12/21 1316       PT SHORT TERM GOAL #1   Title Patient will be independent with HEP in order to improve functional outcomes.    Baseline in development    Time 3    Period Weeks    Status Achieved    Target Date 04/08/21      PT SHORT TERM GOAL #2   Title Patient will report at least 25% improvement in symptoms for improved quality of life.    Baseline 50% improvement    Time 3    Period Weeks    Status Achieved    Target Date 02/25/21      PT SHORT TERM GOAL #3   Title Demo improved gait tolerance as  evidenced by distance of 150 ft during 2MWT    Baseline 174 with RW    Time 3    Period Weeks    Status Achieved    Target Date 02/25/21                PT Long Term Goals - 04/12/21 1316       PT LONG TERM GOAL #1   Title Patient will report at least 75% improvement in symptoms for improved quality of life.    Baseline 50% improvement    Time 6    Period Weeks    Status On-going      PT LONG TERM GOAL #2   Title Patient will be able to ambulate at least 226 feet in 2MWT with LRAD in order to demonstrate improved gait speed for community ambulation.    Baseline 174 with RW    Time 6    Period Weeks    Status On-going      PT LONG TERM GOAL #3   Title Pt will be able to perform 5xSTS in 15 sec or < with no UE support and proper mechanics to demo improved balance and functional BLE strength.    Baseline 31 sec from 18" seat height with BUE support    Time 6    Period Weeks    Status On-going                   Plan - 04/12/21 1315     Clinical Impression Statement MM tested pt to assess which specific mm area still week.  Gt trained with cane and urged pt to attempt to ambulate with cane at home and begin a walking program.  continued to work on balance and functional strength    Personal Factors and Comorbidities Comorbidity 1;Time since onset of injury/illness/exacerbation;Past/Current Experience;Comorbidity 2;Fitness    Comorbidities PMH and hx of mutiple physical trauma, Cirrhosis    Examination-Activity Limitations Bathing;Bed Mobility;Bend;Lift;Toileting;Stand;Squat;Locomotion Level;Transfers    Examination-Participation Restrictions Cleaning;Community Activity;Driving;Shop;Occupation;Meal Prep;Yard Work;Volunteer    Stability/Clinical Decision Making Stable/Uncomplicated    Rehab Potential Good    PT Frequency 2x / week    PT Duration 6 weeks    PT Treatment/Interventions ADLs/Self Care Home Management;Aquatic Therapy;Biofeedback;Cryotherapy;Electrical Stimulation;DME Instruction;Ultrasound;Moist Heat;Gait training;Stair training;Functional mobility training;Therapeutic activities;Therapeutic  exercise;Balance training;Patient/family education;Neuromuscular re-education;Manual techniques;Taping;Energy conservation;Dry needling;Spinal Manipulations;Joint Manipulations;Compression bandaging;Manual lymph drainage    PT Next Visit Plan continue with general strength, hip abductor, core    PT Home Exercise Plan marching, LAQ, ankle pumps 6/28 sit/stands; 02/21/21: begin walking program    Consulted and Agree with Plan of Care Patient             Patient will benefit from skilled therapeutic intervention in order to improve the following deficits and impairments:  Abnormal gait, Decreased activity tolerance, Decreased balance, Decreased mobility, Decreased knowledge of use of DME, Decreased endurance, Decreased range of motion, Decreased strength, Difficulty walking, Impaired perceived functional ability, Improper body mechanics, Pain  Visit Diagnosis: Difficulty in walking, not elsewhere classified  Other abnormalities of gait and mobility  Muscle weakness (generalized)     Problem List Patient Active Problem List   Diagnosis Date Noted   Iron deficiency anemia due to chronic blood loss 02/13/2021   History of uterine cancer 02/13/2021   Decompensated hepatic cirrhosis ([place]) 10/25/2020   OSA (obstructive sleep apnea) 10/25/2020   Glossitis 10/25/2020   Grade IV internal hemorrhoids    Acute blood loss anemia 10/08/2020   Hyponatremia 10/08/2020  Rectal bleeding 10/08/2020   Acute respiratory failure with hypoxia ([employer]) 09/10/2020   Pneumonia due to COVID-19 virus 09/08/2020   Hip fracture, right ([place]) 09/04/2020   Closed right hip fracture ([place]) 09/02/2020   Diabetes mellitus with hyperglycemia ([place]) 09/02/2020   Hypertension    Thalassemia minor    Pancytopenia ([place])    HLD (hyperlipidemia)    Acute renal failure superimposed on stage 3a chronic kidney disease ([place])    Depression    Fall at home, initial encounter    Nondisplaced fracture of greater trochanter of  right femur, initial encounter for closed fracture [facility])    Rayetta Humphrey, PT CLT [id number]  04/12/2021, 2:03 PM  [place], [place], [address] Phone: [phone]   Fax:  [phone]  Name: Rachel Huber MRN: [id number] Date of Birth: 1956/02/22

## 2021-04-15 ENCOUNTER — Inpatient Hospital Stay (HOSPITAL_COMMUNITY): Payer: 59 | Attending: Hematology

## 2021-04-15 ENCOUNTER — Other Ambulatory Visit: Payer: Self-pay

## 2021-04-15 DIAGNOSIS — D500 Iron deficiency anemia secondary to blood loss (chronic): Secondary | ICD-10-CM | POA: Diagnosis not present

## 2021-04-15 LAB — FERRITIN: Ferritin: 79 ng/mL (ref 11–307)

## 2021-04-15 LAB — CBC WITH DIFFERENTIAL/PLATELET
Abs Immature Granulocytes: 0.01 10*3/uL (ref 0.00–0.07)
Basophils Absolute: 0 10*3/uL (ref 0.0–0.1)
Basophils Relative: 1 %
Eosinophils Absolute: 0.2 10*3/uL (ref 0.0–0.5)
Eosinophils Relative: 4 %
HCT: 29.7 % — ABNORMAL LOW (ref 36.0–46.0)
Hemoglobin: 8.6 g/dL — ABNORMAL LOW (ref 12.0–15.0)
Immature Granulocytes: 0 %
Lymphocytes Relative: 26 %
Lymphs Abs: 1.1 10*3/uL (ref 0.7–4.0)
MCH: 17.1 pg — ABNORMAL LOW (ref 26.0–34.0)
MCHC: 29 g/dL — ABNORMAL LOW (ref 30.0–36.0)
MCV: 59.2 fL — ABNORMAL LOW (ref 80.0–100.0)
Monocytes Absolute: 0.5 10*3/uL (ref 0.1–1.0)
Monocytes Relative: 12 %
Neutro Abs: 2.4 10*3/uL (ref 1.7–7.7)
Neutrophils Relative %: 57 %
Platelets: 223 10*3/uL (ref 150–400)
RBC: 5.02 MIL/uL (ref 3.87–5.11)
RDW: 18.8 % — ABNORMAL HIGH (ref 11.5–15.5)
WBC: 4.2 10*3/uL (ref 4.0–10.5)
nRBC: 0 % (ref 0.0–0.2)

## 2021-04-15 LAB — IRON AND TIBC
Iron: 64 ug/dL (ref 28–170)
Saturation Ratios: 19 % (ref 10.4–31.8)
TIBC: 332 ug/dL (ref 250–450)
UIBC: 268 ug/dL

## 2021-04-16 ENCOUNTER — Encounter (HOSPITAL_COMMUNITY): Payer: Self-pay | Admitting: Physical Therapy

## 2021-04-16 ENCOUNTER — Ambulatory Visit (HOSPITAL_COMMUNITY): Payer: 59 | Admitting: Physical Therapy

## 2021-04-16 DIAGNOSIS — M6281 Muscle weakness (generalized): Secondary | ICD-10-CM

## 2021-04-16 DIAGNOSIS — R262 Difficulty in walking, not elsewhere classified: Secondary | ICD-10-CM

## 2021-04-16 DIAGNOSIS — R2689 Other abnormalities of gait and mobility: Secondary | ICD-10-CM

## 2021-04-16 NOTE — Patient Instructions (Signed)
Access Code: [email or web address] URL: [email or web address] Date: 04/16/2021 Prepared by: Josue Hector  Exercises Standing Tandem Balance with Counter Support - 1-2 x daily - 7 x weekly - 1 sets - 3 reps - 15 seconds hold Heel Raises with Counter Support - 1-2 x daily - 7 x weekly - 2 sets - 10 reps Heel Toe Raises with Counter Support - 1-2 x daily - 7 x weekly - 2 sets - 10 reps Side Stepping with Counter Support - 1-2 x daily - 7 x weekly - 1 sets - 10 reps

## 2021-04-16 NOTE — Therapy (Signed)
[place], [place], [address] Phone: [phone]   Fax:  [phone]  Physical Therapy Treatment  Patient Details  Name: Rachel Huber MRN: [id number] Date of Birth: Mar 24, 1956 Referring Provider (PT): Mortimer Fries PA-C   Encounter Date: 04/16/2021   PT End of Session - 04/16/21 1527     Visit Number 14    Number of Visits 24    Date for PT Re-Evaluation 04/29/21    Authorization Type [employer]    Authorization Time Period VL based on medical necessity, 60 total    Authorization - Visit Number 17    Authorization - Number of Visits 60    Progress Note Due on Visit 20    PT Start Time 1520    PT Stop Time 1602    PT Time Calculation (min) 42 min    Activity Tolerance Patient tolerated treatment well    Behavior During Therapy WFL for tasks assessed/performed             Past Medical History:  Diagnosis Date   Anxiety    Arthritis    Cirrhosis of liver ([place])    Diabetes mellitus without complication ([place])    Dyspnea    DOE   GERD (gastroesophageal reflux disease)    Hepatitis    PAST   Hypertension    Neuropathy    Neuropathy, diabetic ([place])    Pneumonia    Sinus complaint    Sleep apnea    CPAP   Thalassemia minor 1992    Past Surgical History:  Procedure Laterality Date   ABDOMINAL HYSTERECTOMY     BREAST BIOPSY Right 02/15/2018   [place] bx 6-6:30 ribbon shape, ONE CORE FRAGMENT WITH FIBROSIS. ONE CORE FRAGMENT WITH PORTION OF A DILATED   BREAST BIOPSY Right 02/15/2018   [place] bx 9:00 heart shape, USUAL DUCTAL HYPERPLASIA   BREAST LUMPECTOMY Right 03/09/2018   Procedure: BREAST LUMPECTOMY x 2;  Surgeon: Benjamine Sprague, DO;  Location: [facility] ORS;  Service: General;  Laterality: Right;   CATARACT EXTRACTION W/PHACO Left 06/11/2016   Procedure: CATARACT EXTRACTION PHACO AND INTRAOCULAR LENS PLACEMENT (IOC);  Surgeon: Estill Cotta, MD;  Location: [facility] ORS;  Service: Ophthalmology;  Laterality: Left;   Lot # [id number] H US:01:38.6 AP%:26.4 CDE:44.15   CATARACT EXTRACTION W/PHACO Right 06/15/2018   Procedure: CATARACT EXTRACTION PHACO AND INTRAOCULAR LENS PLACEMENT (IOC);  Surgeon: Birder Robson, MD;  Location: [facility] ORS;  Service: Ophthalmology;  Laterality: Right;  [place] 00:38.2 CDE 4.23 Fluid Pack Lot # [id number]   COLONOSCOPIES     COLONOSCOPY WITH PROPOFOL N/A 10/10/2020   Procedure: COLONOSCOPY WITH PROPOFOL;  Surgeon: Lesly Rubenstein, MD;  Location: [facility] ENDOSCOPY;  Service: Endoscopy;  Laterality: N/A;   COLONOSCOPY WITH PROPOFOL N/A 11/20/2020   Procedure: COLONOSCOPY WITH PROPOFOL;  Surgeon: Lesly Rubenstein, MD;  Location: [facility] ENDOSCOPY;  Service: Endoscopy;  Laterality: N/A;  DM STAT CBC, BMP COVID POSITIVE 09/02/2020   CTR     ESOPHAGOGASTRODUODENOSCOPY (EGD) WITH PROPOFOL N/A 10/10/2020   Procedure: ESOPHAGOGASTRODUODENOSCOPY (EGD) WITH PROPOFOL;  Surgeon: Lesly Rubenstein, MD;  Location: [facility] ENDOSCOPY;  Service: Endoscopy;  Laterality: N/A;  COVID POSITIVE 10/08/2020   JOINT REPLACEMENT     KNEE SURGERY Right 09/24/2017   plates and pins   TOTAL SHOULDER ARTHROPLASTY Right 09/27/2015    There were no vitals filed for this visit.   Subjective Assessment - 04/16/21 1526     Subjective Feeling stronger. Exercises going well. Still  having most trouble with balance. No pain currently.    Limitations Standing;Walking;House hold activities    Currently in Pain? No/denies                               [facility] Adult PT Treatment/Exercise - 04/16/21 0001       Knee/Hip Exercises: Aerobic   Nustep 4 min dynamic warmup      Knee/Hip Exercises: Standing   Heel Raises Both;2 sets;10 reps    Heel Raises Limitations toe raises with HHA 2 x 10    Knee Flexion Both;2 sets;10 reps    Knee Flexion Limitations 3lb    Hip Abduction 2 sets;10 reps    Abduction Limitations 3lb    Hip Extension Both;2 sets;10 reps;Knee straight    Extension Limitations 3lb     Other Standing Knee Exercises sidestepping 3 RT with 3 lb weights    Other Standing Knee Exercises tandem stance 3 x 15", SLS with toe touch assist 2 x 10" and int HHA      Knee/Hip Exercises: Seated   Sit to Sand 2 sets;10 reps;with UE support                      PT Short Term Goals - 04/12/21 1316       PT SHORT TERM GOAL #1   Title Patient will be independent with HEP in order to improve functional outcomes.    Baseline in development    Time 3    Period Weeks    Status Achieved    Target Date 04/08/21      PT SHORT TERM GOAL #2   Title Patient will report at least 25% improvement in symptoms for improved quality of life.    Baseline 50% improvement    Time 3    Period Weeks    Status Achieved    Target Date 02/25/21      PT SHORT TERM GOAL #3   Title Demo improved gait tolerance as evidenced by distance of 150 ft during 2MWT    Baseline 174 with RW    Time 3    Period Weeks    Status Achieved    Target Date 02/25/21               PT Long Term Goals - 04/12/21 1316       PT LONG TERM GOAL #1   Title Patient will report at least 75% improvement in symptoms for improved quality of life.    Baseline 50% improvement    Time 6    Period Weeks    Status On-going      PT LONG TERM GOAL #2   Title Patient will be able to ambulate at least 226 feet in 2MWT with LRAD in order to demonstrate improved gait speed for community ambulation.    Baseline 174 with RW    Time 6    Period Weeks    Status On-going      PT LONG TERM GOAL #3   Title Pt will be able to perform 5xSTS in 15 sec or < with no UE support and proper mechanics to demo improved balance and functional BLE strength.    Baseline 31 sec from 18" seat height with BUE support    Time 6    Period Weeks    Status On-going  Plan - 04/16/21 1602     Clinical Impression Statement Patient tolerated session well today. Noting increased muscle fatigue with ther ex  progressions. Patient continues to be challenged with standing balance, notably single leg standing. Discussed employment of toe touch assist to facilitate improvement with this. Patient cued on proper form and mechanics with all added exercise. Patient will continue to benefit from skilled therapy services to reduce deficits and improve functional level.    Personal Factors and Comorbidities Comorbidity 1;Time since onset of injury/illness/exacerbation;Past/Current Experience;Comorbidity 2;Fitness    Comorbidities PMH and hx of mutiple physical trauma, Cirrhosis    Examination-Activity Limitations Bathing;Bed Mobility;Bend;Lift;Toileting;Stand;Squat;Locomotion Level;Transfers    Examination-Participation Restrictions Cleaning;Community Activity;Driving;Shop;Occupation;Meal Prep;Yard Work;Volunteer    Stability/Clinical Decision Making Stable/Uncomplicated    Rehab Potential Good    PT Frequency 2x / week    PT Duration 6 weeks    PT Treatment/Interventions ADLs/Self Care Home Management;Aquatic Therapy;Biofeedback;Cryotherapy;Electrical Stimulation;DME Instruction;Ultrasound;Moist Heat;Gait training;Stair training;Functional mobility training;Therapeutic activities;Therapeutic exercise;Balance training;Patient/family education;Neuromuscular re-education;Manual techniques;Taping;Energy conservation;Dry needling;Spinal Manipulations;Joint Manipulations;Compression bandaging;Manual lymph drainage    PT Next Visit Plan continue with general strength, hip abductor, core. Gait training with cane    PT Home Exercise Plan marching, LAQ, ankle pumps 6/28 sit/stands; 02/21/21: begin walking program 8/30 heel raise, toe raise, sidestepping, tandem stance    Consulted and Agree with Plan of Care Patient             Patient will benefit from skilled therapeutic intervention in order to improve the following deficits and impairments:  Abnormal gait, Decreased activity tolerance, Decreased balance, Decreased  mobility, Decreased knowledge of use of DME, Decreased endurance, Decreased range of motion, Decreased strength, Difficulty walking, Impaired perceived functional ability, Improper body mechanics, Pain  Visit Diagnosis: Difficulty in walking, not elsewhere classified  Other abnormalities of gait and mobility  Muscle weakness (generalized)     Problem List Patient Active Problem List   Diagnosis Date Noted   Iron deficiency anemia due to chronic blood loss 02/13/2021   History of uterine cancer 02/13/2021   Decompensated hepatic cirrhosis ([place]) 10/25/2020   OSA (obstructive sleep apnea) 10/25/2020   Glossitis 10/25/2020   Grade IV internal hemorrhoids    Acute blood loss anemia 10/08/2020   Hyponatremia 10/08/2020   Rectal bleeding 10/08/2020   Acute respiratory failure with hypoxia ([place]) 09/10/2020   Pneumonia due to COVID-19 virus 09/08/2020   Hip fracture, right ([place]) 09/04/2020   Closed right hip fracture ([place]) 09/02/2020   Diabetes mellitus with hyperglycemia ([place]) 09/02/2020   Hypertension    Thalassemia minor    Pancytopenia ([place])    HLD (hyperlipidemia)    Acute renal failure superimposed on stage 3a chronic kidney disease ([place])    Depression    Fall at home, initial encounter    Nondisplaced fracture of greater trochanter of right femur, initial encounter for closed fracture ([place])    4:13 PM, 04/16/21 Josue Hector PT DPT  Physical Therapist with [facility]  [phone] [place], [place], [address] Phone: [phone]   Fax:  [phone]  Name: KARMA ANSLEY MRN: [id number] Date of Birth: 12-20-1955

## 2021-04-17 ENCOUNTER — Encounter (HOSPITAL_COMMUNITY): Payer: Self-pay | Admitting: Hematology and Oncology

## 2021-04-17 ENCOUNTER — Other Ambulatory Visit: Payer: Self-pay

## 2021-04-17 ENCOUNTER — Ambulatory Visit (HOSPITAL_COMMUNITY): Payer: 59 | Admitting: Physical Therapy

## 2021-04-17 DIAGNOSIS — M6281 Muscle weakness (generalized): Secondary | ICD-10-CM

## 2021-04-17 DIAGNOSIS — R2689 Other abnormalities of gait and mobility: Secondary | ICD-10-CM | POA: Diagnosis not present

## 2021-04-17 DIAGNOSIS — R262 Difficulty in walking, not elsewhere classified: Secondary | ICD-10-CM

## 2021-04-17 NOTE — Therapy (Signed)
[place], [place], [address] Phone: [phone]   Fax:  [phone]  Physical Therapy Treatment  Patient Details  Name: Rachel Huber MRN: [id number] Date of Birth: 03/08/1956 Referring Provider (PT): Mortimer Fries PA-C   Encounter Date: 04/17/2021   PT End of Session - 04/17/21 1211     Visit Number 15    Number of Visits 24    Date for PT Re-Evaluation 04/29/21    Authorization Type [employer]    Authorization Time Period VL based on medical necessity, 60 total    Authorization - Visit Number 18    Authorization - Number of Visits 60    Progress Note Due on Visit 20    PT Start Time 1047    PT Stop Time 1134    PT Time Calculation (min) 47 min    Activity Tolerance Patient tolerated treatment well    Behavior During Therapy WFL for tasks assessed/performed             Past Medical History:  Diagnosis Date   Anxiety    Arthritis    Cirrhosis of liver ([place])    Diabetes mellitus without complication ([place])    Dyspnea    DOE   GERD (gastroesophageal reflux disease)    Hepatitis    PAST   Hypertension    Neuropathy    Neuropathy, diabetic ([place])    Pneumonia    Sinus complaint    Sleep apnea    CPAP   Thalassemia minor 1992    Past Surgical History:  Procedure Laterality Date   ABDOMINAL HYSTERECTOMY     BREAST BIOPSY Right 02/15/2018   [place] bx 6-6:30 ribbon shape, ONE CORE FRAGMENT WITH FIBROSIS. ONE CORE FRAGMENT WITH PORTION OF A DILATED   BREAST BIOPSY Right 02/15/2018   [place] bx 9:00 heart shape, USUAL DUCTAL HYPERPLASIA   BREAST LUMPECTOMY Right 03/09/2018   Procedure: BREAST LUMPECTOMY x 2;  Surgeon: Benjamine Sprague, DO;  Location: [facility] ORS;  Service: General;  Laterality: Right;   CATARACT EXTRACTION W/PHACO Left 06/11/2016   Procedure: CATARACT EXTRACTION PHACO AND INTRAOCULAR LENS PLACEMENT (IOC);  Surgeon: Estill Cotta, MD;  Location: [facility] ORS;  Service: Ophthalmology;  Laterality: Left;   Lot # [id number] H US:01:38.6 AP%:26.4 CDE:44.15   CATARACT EXTRACTION W/PHACO Right 06/15/2018   Procedure: CATARACT EXTRACTION PHACO AND INTRAOCULAR LENS PLACEMENT (IOC);  Surgeon: Birder Robson, MD;  Location: [facility] ORS;  Service: Ophthalmology;  Laterality: Right;  [place] 00:38.2 CDE 4.23 Fluid Pack Lot # [id number]   COLONOSCOPIES     COLONOSCOPY WITH PROPOFOL N/A 10/10/2020   Procedure: COLONOSCOPY WITH PROPOFOL;  Surgeon: Lesly Rubenstein, MD;  Location: [facility] ENDOSCOPY;  Service: Endoscopy;  Laterality: N/A;   COLONOSCOPY WITH PROPOFOL N/A 11/20/2020   Procedure: COLONOSCOPY WITH PROPOFOL;  Surgeon: Lesly Rubenstein, MD;  Location: [facility] ENDOSCOPY;  Service: Endoscopy;  Laterality: N/A;  DM STAT CBC, BMP COVID POSITIVE 09/02/2020   CTR     ESOPHAGOGASTRODUODENOSCOPY (EGD) WITH PROPOFOL N/A 10/10/2020   Procedure: ESOPHAGOGASTRODUODENOSCOPY (EGD) WITH PROPOFOL;  Surgeon: Lesly Rubenstein, MD;  Location: [facility] ENDOSCOPY;  Service: Endoscopy;  Laterality: N/A;  COVID POSITIVE 10/08/2020   JOINT REPLACEMENT     KNEE SURGERY Right 09/24/2017   plates and pins   TOTAL SHOULDER ARTHROPLASTY Right 09/27/2015    There were no vitals filed for this visit.   Subjective Assessment - 04/17/21 1051     Subjective pt states she is doing well,  getting stronger.  Reports her balance is most limited but has no pain.    Currently in Pain? No/denies                               [facility] Adult PT Treatment/Exercise - 04/17/21 0001       Ambulation/Gait   Ambulation/Gait Yes    Ambulation/Gait Assistance 6: Modified independent (Device/Increase time)    Ambulation Distance (Feet) 125 Feet    Assistive device Straight cane      Knee/Hip Exercises: Aerobic   Nustep 4 min dynamic warmup      Knee/Hip Exercises: Standing   Heel Raises 15 reps    Heel Raises Limitations toe raises 15x    Hip Abduction 2 sets;10 reps    Abduction Limitations 3lb    Hip Extension Both;2  sets;10 reps;Knee straight    Extension Limitations 3lb    Other Standing Knee Exercises sidestepping 3 RT with 3 lb weights in parallel bars      Knee/Hip Exercises: Seated   Sit to Sand 10 reps;without UE support   only able to complete 8 due to gluteal fatique                     PT Short Term Goals - 04/12/21 1316       PT SHORT TERM GOAL #1   Title Patient will be independent with HEP in order to improve functional outcomes.    Baseline in development    Time 3    Period Weeks    Status Achieved    Target Date 04/08/21      PT SHORT TERM GOAL #2   Title Patient will report at least 25% improvement in symptoms for improved quality of life.    Baseline 50% improvement    Time 3    Period Weeks    Status Achieved    Target Date 02/25/21      PT SHORT TERM GOAL #3   Title Demo improved gait tolerance as evidenced by distance of 150 ft during 2MWT    Baseline 174 with RW    Time 3    Period Weeks    Status Achieved    Target Date 02/25/21               PT Long Term Goals - 04/12/21 1316       PT LONG TERM GOAL #1   Title Patient will report at least 75% improvement in symptoms for improved quality of life.    Baseline 50% improvement    Time 6    Period Weeks    Status On-going      PT LONG TERM GOAL #2   Title Patient will be able to ambulate at least 226 feet in 2MWT with LRAD in order to demonstrate improved gait speed for community ambulation.    Baseline 174 with RW    Time 6    Period Weeks    Status On-going      PT LONG TERM GOAL #3   Title Pt will be able to perform 5xSTS in 15 sec or < with no UE support and proper mechanics to demo improved balance and functional BLE strength.    Baseline 31 sec from 18" seat height with BUE support    Time 6    Period Weeks    Status On-going  Plan - 04/17/21 1207     Clinical Impression Statement Pt prefers short warm up on nustep prior to beginning exercises.   Ambulation with SPC with good cadence and sequencing; no LOB or instability noted.  Fatigue with most exercises needing intermittent short rest breaks.  Sit to stand challenging with cues to contract glutes and push up remaining in good upright posturing.  Pt unable to complete last 2 reps due to fatigue.  Sidestepping completed in parallel bars for extra stability but able to complete without use of UE.    Personal Factors and Comorbidities Comorbidity 1;Time since onset of injury/illness/exacerbation;Past/Current Experience;Comorbidity 2;Fitness    Comorbidities PMH and hx of mutiple physical trauma, Cirrhosis    Examination-Activity Limitations Bathing;Bed Mobility;Bend;Lift;Toileting;Stand;Squat;Locomotion Level;Transfers    Examination-Participation Restrictions Cleaning;Community Activity;Driving;Shop;Occupation;Meal Prep;Yard Work;Volunteer    Stability/Clinical Decision Making Stable/Uncomplicated    Rehab Potential Good    PT Frequency 2x / week    PT Duration 6 weeks    PT Treatment/Interventions ADLs/Self Care Home Management;Aquatic Therapy;Biofeedback;Cryotherapy;Electrical Stimulation;DME Instruction;Ultrasound;Moist Heat;Gait training;Stair training;Functional mobility training;Therapeutic activities;Therapeutic exercise;Balance training;Patient/family education;Neuromuscular re-education;Manual techniques;Taping;Energy conservation;Dry needling;Spinal Manipulations;Joint Manipulations;Compression bandaging;Manual lymph drainage    PT Next Visit Plan contiue gait training with cane.  Next session begin vector stance and lunges to activate glutes and increase LE stability.  Attmept sidestepping on line.    PT Home Exercise Plan marching, LAQ, ankle pumps 6/28 sit/stands; 02/21/21: begin walking program 8/30 heel raise, toe raise, sidestepping, tandem stance    Consulted and Agree with Plan of Care Patient             Patient will benefit from skilled therapeutic intervention in order  to improve the following deficits and impairments:  Abnormal gait, Decreased activity tolerance, Decreased balance, Decreased mobility, Decreased knowledge of use of DME, Decreased endurance, Decreased range of motion, Decreased strength, Difficulty walking, Impaired perceived functional ability, Improper body mechanics, Pain  Visit Diagnosis: Difficulty in walking, not elsewhere classified  Other abnormalities of gait and mobility  Muscle weakness (generalized)     Problem List Patient Active Problem List   Diagnosis Date Noted   Iron deficiency anemia due to chronic blood loss 02/13/2021   History of uterine cancer 02/13/2021   Decompensated hepatic cirrhosis ([place]) 10/25/2020   OSA (obstructive sleep apnea) 10/25/2020   Glossitis 10/25/2020   Grade IV internal hemorrhoids    Acute blood loss anemia 10/08/2020   Hyponatremia 10/08/2020   Rectal bleeding 10/08/2020   Acute respiratory failure with hypoxia ([place]) 09/10/2020   Pneumonia due to COVID-19 virus 09/08/2020   Hip fracture, right ([place]) 09/04/2020   Closed right hip fracture ([place]) 09/02/2020   Diabetes mellitus with hyperglycemia ([place]) 09/02/2020   Hypertension    Thalassemia minor    Pancytopenia ([place])    HLD (hyperlipidemia)    Acute renal failure superimposed on stage 3a chronic kidney disease ([place])    Depression    Fall at home, initial encounter    Nondisplaced fracture of greater trochanter of right femur, initial encounter for closed fracture [facility] [place])    Teena Irani, PTA/CLT [phone]  Teena Irani 04/17/2021, 12:13 PM  [place], [place], [address] Phone: [phone]   Fax:  [phone]  Name: Rachel Huber MRN: [id number] Date of Birth: 1956/02/07

## 2021-04-19 ENCOUNTER — Ambulatory Visit
Admission: RE | Admit: 2021-04-19 | Discharge: 2021-04-19 | Disposition: A | Discharge status: Home / Self Care | Payer: 59 | Source: Ambulatory Visit | Attending: Gastroenterology | Admitting: Gastroenterology

## 2021-04-19 ENCOUNTER — Other Ambulatory Visit: Payer: Self-pay

## 2021-04-19 DIAGNOSIS — K7460 Unspecified cirrhosis of liver: Secondary | ICD-10-CM | POA: Diagnosis present

## 2021-04-19 DIAGNOSIS — K652 Spontaneous bacterial peritonitis: Secondary | ICD-10-CM | POA: Diagnosis present

## 2021-04-19 LAB — PROTEIN, PLEURAL OR PERITONEAL FLUID: Total protein, fluid: <3 g/dL

## 2021-04-19 LAB — ALBUMIN, PLEURAL OR PERITONEAL FLUID: Albumin, Fluid: 1.5 g/dL

## 2021-04-19 LAB — BODY FLUID CELL COUNT WITH DIFFERENTIAL
Eos, Fluid: 0 %
Lymphs, Fluid: 40 %
Monocyte-Macrophage-Serous Fluid: 52 %
Neutrophil Count, Fluid: 8 %
Total Nucleated Cell Count, Fluid: 279 cu mm

## 2021-04-19 MED ORDER — ALBUMIN HUMAN 25 % IV SOLN
INTRAVENOUS | Status: AC
  Administered 2021-04-19: 25 g via INTRAVENOUS
  Filled 2021-04-19: qty 100

## 2021-04-19 MED ORDER — ALBUMIN HUMAN 25 % IV SOLN: 25.0000 g | Freq: Once | INTRAVENOUS | Status: AC

## 2021-04-19 NOTE — Procedures (Signed)
Vascular and Interventional Radiology Procedure Note  Patient: Rachel Huber DOB: 1956-08-07 Medical Record Number: [id number] Note Date/Time: 04/19/21 1:37 PM   Performing Physician: Michaelle Birks, MD Assistant(s): None  Diagnosis:  refractory ascites  Procedure: PARACENTESIS, DIAGNOSTIC AND THERAPEUTIC  Anesthesia: Local Anesthetic Complications: None Estimated Blood Loss:  1 mL Specimens:  Sent Cytology and Pathology  Findings:  The LeftLower quadrant peritoneal space was accessed with a 65F Pigtail catheter  Intravenous Albumin: Was administered.  See detailed procedure note with images in PACS. The patient tolerated the procedure well without incident or complication and was returned to Recovery in stable condition.    Michaelle Birks, MD [facility] Radiology   Pager. [place]

## 2021-04-21 LAB — PROTEIN, BODY FLUID (OTHER): Total Protein, Body Fluid Other: 2.9 g/dL

## 2021-04-23 LAB — BODY FLUID CULTURE W GRAM STAIN: Culture: NO GROWTH

## 2021-04-23 NOTE — Progress Notes (Signed)
Rachel Huber, [place] [address]   CLINIC:  Medical Oncology/Hematology  PCP:  [name], MD [place] [address] [phone]   REASON FOR VISIT:  Follow-up for iron deficiency anemia  PRIOR THERAPY: Blood transfusions as needed  CURRENT THERAPY: IV iron infusions as needed (last given Venofer 300 mg x 3 from 02/22/2021 through 03/08/2021)  INTERVAL HISTORY:  Rachel Huber 65 y.o. female returns for routine follow-up of iron deficiency anemia secondary to chronic blood loss and malabsorption.  She was last seen by Dr. Heath Lark on 02/13/2021.  At today's visit, she reports feeling fair.  No recent hospitalizations, surgeries, or changes in baseline health status.  She reports that she tolerated her IV iron infusions well, but did not notice any difference in her baseline fatigue.  She continues to have intermittent rectal bleeding - most recently occurred 2 months ago after straining to have a bowel movement; bleeding with bowel movements lasted 1-2 days before resolving.  She denies other signs of blood loss such as epistaxis, hematemesis, or melena.  She has chronic fatigue at baseline, but denies any chest pain, dyspnea on exertion, palpitations, or near-syncopal episodes.  No pica.  No B-symptoms such as fever, chills, night sweats, or unintentional weight loss.  She has 75% energy and 100% appetite. She endorses that she is maintaining a stable weight.   REVIEW OF SYSTEMS:  Review of Systems  Constitutional:  Positive for fatigue. Negative for appetite change, chills, diaphoresis, fever and unexpected weight change.  HENT:   Negative for lump/mass and nosebleeds.   Eyes:  Negative for eye problems.  Respiratory:  Negative for cough, hemoptysis and shortness of breath.   Cardiovascular:  Positive for leg swelling. Negative for chest pain and palpitations.  Gastrointestinal:  Negative for  abdominal pain, blood in stool, constipation, diarrhea, nausea and vomiting.  Genitourinary:  Negative for hematuria.   Skin: Negative.   Neurological:  Negative for dizziness, headaches and light-headedness.  Hematological:  Does not bruise/bleed easily.     PAST MEDICAL/SURGICAL HISTORY:  Past Medical History:  Diagnosis Date   Anxiety    Arthritis    Cirrhosis of liver ([place])    Diabetes mellitus without complication ([facility])    Dyspnea    DOE   GERD (gastroesophageal reflux disease)    Hepatitis    PAST   Hypertension    Neuropathy    Neuropathy, diabetic ([place])    Pneumonia    Sinus complaint    Sleep apnea    CPAP   Thalassemia minor 1992   Past Surgical History:  Procedure Laterality Date   ABDOMINAL HYSTERECTOMY     BREAST BIOPSY Right 02/15/2018   [place] bx 6-6:30 ribbon shape, ONE CORE FRAGMENT WITH FIBROSIS. ONE CORE FRAGMENT WITH PORTION OF A DILATED   BREAST BIOPSY Right 02/15/2018   [place] bx 9:00 heart shape, USUAL DUCTAL HYPERPLASIA   BREAST LUMPECTOMY Right 03/09/2018   Procedure: BREAST LUMPECTOMY x 2;  Surgeon: Benjamine Sprague, DO;  Location: [facility] ORS;  Service: General;  Laterality: Right;   CATARACT EXTRACTION W/PHACO Left 06/11/2016   Procedure: CATARACT EXTRACTION PHACO AND INTRAOCULAR LENS PLACEMENT (IOC);  Surgeon: Estill Cotta, MD;  Location: [facility] ORS;  Service: Ophthalmology;  Laterality: Left;  Lot # [id number] H US:01:38.6 AP%:26.4 CDE:44.15   CATARACT EXTRACTION W/PHACO Right 06/15/2018   Procedure: CATARACT EXTRACTION PHACO AND INTRAOCULAR LENS PLACEMENT (IOC);  Surgeon: Birder Robson, MD;  Location: [facility] ORS;  Service: Ophthalmology;  Laterality: Right;  [place] 00:38.2 CDE 4.23 Fluid Pack Lot # [id number]   COLONOSCOPIES     COLONOSCOPY WITH PROPOFOL N/A 10/10/2020   Procedure: COLONOSCOPY WITH PROPOFOL;  Surgeon: Lesly Rubenstein, MD;  [facility] ENDOSCOPY;  Service: Endoscopy;  Laterality: N/A;   COLONOSCOPY WITH PROPOFOL N/A 11/20/2020    Procedure: COLONOSCOPY WITH PROPOFOL;  Surgeon: Lesly Rubenstein, MD;  Location: [facility] ENDOSCOPY;  Service: Endoscopy;  Laterality: N/A;  DM STAT CBC, BMP COVID POSITIVE 09/02/2020   CTR     ESOPHAGOGASTRODUODENOSCOPY (EGD) WITH PROPOFOL N/A 10/10/2020   Procedure: ESOPHAGOGASTRODUODENOSCOPY (EGD) WITH PROPOFOL;  Surgeon: Lesly Rubenstein, MD;  Location: [facility] ENDOSCOPY;  Service: Endoscopy;  Laterality: N/A;  COVID POSITIVE 10/08/2020   JOINT REPLACEMENT     KNEE SURGERY Right 09/24/2017   plates and pins   TOTAL SHOULDER ARTHROPLASTY Right 09/27/2015     SOCIAL HISTORY:  Social History   Socioeconomic History   Marital status: Married    Spouse name: Not on file   Number of children: 0   Years of education: Not on file   Highest education level: Not on file  Occupational History   Occupation: EMPLOYED  Tobacco Use   Smoking status: Never   Smokeless tobacco: Never  Vaping Use   Vaping Use: Never used  Substance and Sexual Activity   Alcohol use: No   Drug use: Never   Sexual activity: Not Currently  Other Topics Concern   Not on file  Social History Narrative   Not on file   Social Determinants of Health   Financial Resource Strain: Low Risk    Difficulty of Paying Living Expenses: Not hard at all  Food Insecurity: No Food Insecurity   Worried About Charity fundraiser in the Last Year: Never true   [place] in the Last Year: Never true  Transportation Needs: No Transportation Needs   Lack of Transportation (Medical): No   Lack of Transportation (Non-Medical): No  Physical Activity: Inactive   Days of Exercise per Week: 0 days   Minutes of Exercise per Session: 0 min  Stress: No Stress Concern Present   Feeling of Stress : Not at all  Social Connections: Moderately Isolated   Frequency of Communication with Friends and Family: More than three times a week   Frequency of Social Gatherings with Friends and Family: Once a week   Attends Religious  Services: Never   Marine scientist or Organizations: No   Attends Music therapist: Never   Marital Status: Married  Human resources officer Violence: Not At Risk   Fear of Current or Ex-Partner: No   Emotionally Abused: No   Physically Abused: No   Sexually Abused: No    FAMILY HISTORY:  Family History  Problem Relation Age of Onset   Breast cancer Mother 52   Lymphoma Mother    Diabetes Father    Kidney cancer Father    Heart disease Father    Diabetes Sister    Breast cancer Sister 27    CURRENT MEDICATIONS:  Outpatient Encounter Medications as of 04/24/2021  Medication Sig   acetaminophen (TYLENOL) 650 MG CR tablet Take 1,300 mg by mouth daily as needed for pain.   atorvastatin (LIPITOR) 10 MG tablet Take 10 mg by mouth daily.   diphenhydrAMINE (BENADRYL) 25 MG tablet Take 25 mg by mouth at bedtime as needed.   furosemide (LASIX) 20 MG tablet Take  by mouth.   gabapentin (NEURONTIN) 400 MG capsule Take 800 mg by mouth daily at 2 am.   guaiFENesin-dextromethorphan (ROBITUSSIN DM) 100-10 MG/5ML syrup Take 5 mLs by mouth every 4 (four) hours as needed for cough.   melatonin 5 MG TABS Take 5 mg by mouth at bedtime as needed (sleep).   methocarbamol (ROBAXIN) 500 MG tablet Take 1 tablet (500 mg total) by mouth every 8 (eight) hours as needed for muscle spasms.   Multiple Vitamins-Minerals (MULTIVITAMIN WITH MINERALS) tablet Take 1 tablet by mouth daily.   ondansetron (ZOFRAN-ODT) 4 MG disintegrating tablet Take by mouth.   oxyCODONE-acetaminophen (PERCOCET/ROXICET) 5-325 MG tablet Take 1 tablet by mouth every 6 (six) hours as needed for severe pain.   oxymetazoline (AFRIN) 0.05 % nasal spray Place 1 spray into both nostrils 2 (two) times daily as needed.   sertraline (ZOLOFT) 50 MG tablet Take 50 mg by mouth daily.   spironolactone (ALDACTONE) 100 MG tablet Take by mouth.   TRULICITY 4.5 IR/4.4RX SOPN Inject 4.5 mg into the skin every Wednesday.   No  facility-administered encounter medications on file as of 04/24/2021.    ALLERGIES:  Allergies  Allergen Reactions   Latex Rash    Contact rash     PHYSICAL EXAM:  ECOG PERFORMANCE STATUS: 1 - Symptomatic but completely ambulatory  There were no vitals filed for this visit. There were no vitals filed for this visit. Physical Exam Constitutional:      Appearance: Normal appearance. She is obese.  HENT:     Head: Normocephalic and atraumatic.     Mouth/Throat:     Mouth: Mucous membranes are moist.  Eyes:     Extraocular Movements: Extraocular movements intact.     Pupils: Pupils are equal, round, and reactive to light.  Cardiovascular:     Rate and Rhythm: Normal rate and regular rhythm.     Pulses: Normal pulses.     Heart sounds: Normal heart sounds.  Pulmonary:     Effort: Pulmonary effort is normal.     Breath sounds: Normal breath sounds.  Abdominal:     General: Bowel sounds are normal. There is distension.     Palpations: Abdomen is soft.     Tenderness: There is no abdominal tenderness.  Musculoskeletal:        General: No swelling.     Right lower leg: 1+ Pitting Edema present.     Left lower leg: 1+ Pitting Edema present.  Lymphadenopathy:     Cervical: No cervical adenopathy.  Skin:    General: Skin is warm and dry.  Neurological:     General: No focal deficit present.     Mental Status: She is alert and oriented to person, place, and time.  Psychiatric:        Mood and Affect: Mood normal.        Behavior: Behavior normal.     LABORATORY DATA:  I have reviewed the labs as listed.  CBC    Component Value Date/Time   WBC 4.2 04/15/2021 1210   RBC 5.02 04/15/2021 1210   HGB 8.6 (L) 04/15/2021 1210   HCT 29.7 (L) 04/15/2021 1210   PLT 223 04/15/2021 1210   MCV 59.2 (L) 04/15/2021 1210   MCH 17.1 (L) 04/15/2021 1210   MCHC 29.0 (L) 04/15/2021 1210   RDW 18.8 (H) 04/15/2021 1210   LYMPHSABS 1.1 04/15/2021 1210   MONOABS 0.5 04/15/2021 1210    EOSABS 0.2 04/15/2021 1210   BASOSABS 0.0  04/15/2021 1210   CMP Latest Ref Rng & Units 11/20/2020 10/29/2020 10/27/2020  Glucose 70 - 99 mg/dL 153(H) 118(H) 78  BUN 8 - 23 mg/dL 27(H) 14 21  Creatinine 0.44 - 1.00 mg/dL 1.50(H) 0.92 0.92  Sodium 135 - 145 mmol/L 136 139 137  Potassium 3.5 - 5.1 mmol/L 4.4 3.6 3.4(L)  Chloride 98 - 111 mmol/L 100 106 107  CO2 22 - 32 mmol/L 27 26 22   Calcium 8.9 - 10.3 mg/dL 9.1 8.4(L) 8.5(L)  Total Protein 6.5 - 8.1 g/dL - 5.6(L) 5.5(L)  Total Bilirubin 0.3 - 1.2 mg/dL - 1.0 0.9  Alkaline Phos 38 - 126 U/L - 89 84  AST 15 - 41 U/L - 28 25  ALT 0 - 44 U/L - 20 20    DIAGNOSTIC IMAGING:  I have independently reviewed the relevant imaging and discussed with the patient.  ASSESSMENT & PLAN: 1.  Iron deficiency anemia - Suspected cause of anemia is due to chronic blood loss and malabsorption, as well as thalassemia (see below) - Her hemoglobin has been as low as 4.6, and she has received blood transfusions in the past.  Last blood transfusion was on 10/12/2020. - In April 2022, patient had episode of severe rectal bleeding with Hgb 8.8 and MCV 59.5 - Colonoscopies in February 2022 & April 2022 showed grade III/IV nonbleeding internal hemorrhoids, polyps, and area of significantly congested mucosa - EGD in February 2022 & April 2022 showed portal hypertensive gastropathy and erythematous duodenopathy - Unable to tolerate oral iron supplement and failed to improved on oral iron supplementation - She does not take any NSAIDs or antiplatelet agents - Goal is to keep ferritin level greater than 50; goal Hgb is 9.0-10.0 due to patient's underlying thalassemia - Received IV Venofer in July 2022 due to critically low iron and symptomatic anemia (Venofer 300 mg x 3 from 02/22/2021 through 03/08/2021) - Most recent labs (04/15/2021): Hgb 8.6 with MCV 59.2/MCH 17.1, RBC 5.09, ferritin 79, and iron saturation 19% - Intermittent rectal bleeding, last episode about 2 months  ago;  no epistaxis or melena - Chronic fatigue is at baseline - PLAN: No indication for IV iron at this time.  Repeat CBC and iron panel in 2 months.    2.  Thalassemia, unknown type  - She reports that she has always been anemic, but was not diagnosed with thalassemia until ~ age 62; she unsure what type of thalassemia - Patient's father, sister, and brother all have thalassemia - Her hemoglobin has been as low as 4.6, and she has received blood transfusions in the past related to her rectal bleeding.  She has not required regular transfusion for her thalassemia. - Her highest hemoglobin is around 9-10, but her baseline hemoglobin usually runs from 7.0-8.0 - Most recent CBC shows Hgb 8.6 with MCV 59.2/MCH 17.1, RBC 5.09 - PLAN:  We will check hemoglobin fractionation cascade.  3.  History of uterine cancer - Diagnosed in 2010, she has total hysterectomy and bilateral oophorectomy - She did not require any chemotherapy or radiation - She was treated in [place], [place] - Her last CT imaging of her abdomen and pelvis showed no evidence of cancer recurrence  4.  Decompensated non-alcoholic liver cirrhosis - Diagnosed in February 2022 - She has recurrent ascites from liver cirrhosis, requires intermittent paracentesis - EGD showed signs of portal hypertension - She is aware that she should avoid NSAID and aspirin containing patients indefinitely - She follows with [facility] as well  as [facility]ology   PLAN SUMMARY & DISPOSITION: -  Labs in 2 months - Phone visit 1 week after labs  All questions were answered. The patient knows to call the clinic with any problems, questions or concerns.  Medical decision making: Moderate  Time spent on visit: I spent 25 minutes counseling the patient face to face. The total time spent in the appointment was 40 minutes and more than 50% was on counseling.   Harriett Rush, PA-C  04/24/21 12:07 PM

## 2021-04-24 ENCOUNTER — Other Ambulatory Visit: Payer: Self-pay

## 2021-04-24 ENCOUNTER — Inpatient Hospital Stay (HOSPITAL_COMMUNITY): Payer: 59 | Attending: Hematology and Oncology | Admitting: Physician Assistant

## 2021-04-24 VITALS — BP 111/52 | HR 88 | Temp 97.0°F | Resp 16 | Wt 224.7 lb

## 2021-04-24 DIAGNOSIS — K7460 Unspecified cirrhosis of liver: Secondary | ICD-10-CM | POA: Insufficient documentation

## 2021-04-24 DIAGNOSIS — D508 Other iron deficiency anemias: Secondary | ICD-10-CM | POA: Diagnosis present

## 2021-04-24 DIAGNOSIS — K909 Intestinal malabsorption, unspecified: Secondary | ICD-10-CM | POA: Diagnosis not present

## 2021-04-24 DIAGNOSIS — D569 Thalassemia, unspecified: Secondary | ICD-10-CM | POA: Insufficient documentation

## 2021-04-24 DIAGNOSIS — D563 Thalassemia minor: Secondary | ICD-10-CM | POA: Diagnosis not present

## 2021-04-24 DIAGNOSIS — Z8542 Personal history of malignant neoplasm of other parts of uterus: Secondary | ICD-10-CM | POA: Insufficient documentation

## 2021-04-24 DIAGNOSIS — D500 Iron deficiency anemia secondary to blood loss (chronic): Secondary | ICD-10-CM | POA: Diagnosis not present

## 2021-04-24 LAB — CYTOLOGY - NON PAP

## 2021-04-24 NOTE — Patient Instructions (Signed)
[place] at [facility] Discharge Instructions  You were seen today by Tarri Abernethy PA-C for your iron deficiency anemia and thalassemia.  Your blood and iron levels have improved after your IV iron infusion, and you do not require further IV iron at this time.  However, your hemoglobin remains low (most recently Hgb 8.6), which is likely your chronic baseline due to your underlying thalassemia.  We will check your labs again in 2 months, and we will also check for the type of your thalassemia at that time.  If you experience symptoms of increased bleeding, extreme fatigue, or other alarm symptoms before your next scheduled appointment, please call our office and we will schedule you for labs and office visit sooner rather than later.  LABS: Return in 2 months for labs  OTHER TESTS: No other tests at this time  MEDICATIONS: No changes to home medications  FOLLOW-UP APPOINTMENT: Phone visit in 2 months, the week after labs are done   Thank you for choosing [place] at [facility] to provide your oncology and hematology care.  To afford each patient quality time with our provider, please arrive at least 15 minutes before your scheduled appointment time.   If you have a lab appointment with the [place] [facility] please come in thru the Main Entrance and check in at the main information desk.  You need to re-schedule your appointment should you arrive 10 or more minutes late.  We strive to give you quality time with our providers, and arriving late affects you and other patients whose appointments are after yours.  Also, if you no show three or more times for appointments you may be dismissed from the clinic at the providers discretion.     Again, thank you for choosing [facility].  Our hope is that these requests will decrease the amount of time that you wait before being seen by our physicians.        _____________________________________________________________  Should you have questions after your visit to [facility], please contact our office at [phone] and follow the prompts.  Our office hours are 8:00 a.m. and 4:30 p.m. Monday - Friday.  Please note that voicemails left after 4:00 p.m. may not be returned until the following business day.  We are closed weekends and major holidays.  You do have access to a nurse 24-7, just call the main number to the clinic [phone] and do not press any options, hold on the line and a nurse will answer the phone.    For prescription refill requests, have your pharmacy contact our office and allow 72 hours.    Due to Covid, you will need to wear a mask upon entering the hospital. If you do not have a mask, a mask will be given to you at the Main Entrance upon arrival. For doctor visits, patients may have 1 support person age 37 or older with them. For treatment visits, patients can not have anyone with them due to social distancing guidelines and our immunocompromised population.

## 2021-04-25 ENCOUNTER — Encounter (HOSPITAL_COMMUNITY): Payer: Self-pay | Admitting: Physical Therapy

## 2021-04-25 ENCOUNTER — Ambulatory Visit (HOSPITAL_COMMUNITY): Payer: 59 | Attending: Internal Medicine | Admitting: Physical Therapy

## 2021-04-25 DIAGNOSIS — R262 Difficulty in walking, not elsewhere classified: Secondary | ICD-10-CM | POA: Insufficient documentation

## 2021-04-25 DIAGNOSIS — R2689 Other abnormalities of gait and mobility: Secondary | ICD-10-CM | POA: Insufficient documentation

## 2021-04-25 DIAGNOSIS — M6281 Muscle weakness (generalized): Secondary | ICD-10-CM | POA: Diagnosis present

## 2021-04-25 NOTE — Therapy (Signed)
[place], [place], [address] Phone: [phone]   Fax:  [phone]  Physical Therapy Treatment  Patient Details  Name: Rachel Huber MRN: [id number] Date of Birth: 10/18/1955 Referring Provider (PT): Mortimer Fries PA-C   Encounter Date: 04/25/2021   PT End of Session - 04/25/21 1442     Visit Number 16    Number of Visits 24    Date for PT Re-Evaluation 04/29/21    Authorization Type [employer]    Authorization Time Period VL based on medical necessity, 60 total    Authorization - Visit Number 3    Authorization - Number of Visits 60    Progress Note Due on Visit 20    PT Start Time 1436    PT Stop Time 1515    PT Time Calculation (min) 39 min    Activity Tolerance Patient tolerated treatment well    Behavior During Therapy WFL for tasks assessed/performed             Past Medical History:  Diagnosis Date   Anxiety    Arthritis    Cirrhosis of liver ([place])    Diabetes mellitus without complication ([place])    Dyspnea    DOE   GERD (gastroesophageal reflux disease)    Hepatitis    PAST   Hypertension    Neuropathy    Neuropathy, diabetic ([place])    Pneumonia    Sinus complaint    Sleep apnea    CPAP   Thalassemia minor 1992    Past Surgical History:  Procedure Laterality Date   ABDOMINAL HYSTERECTOMY     BREAST BIOPSY Right 02/15/2018   [place] bx 6-6:30 ribbon shape, ONE CORE FRAGMENT WITH FIBROSIS. ONE CORE FRAGMENT WITH PORTION OF A DILATED   BREAST BIOPSY Right 02/15/2018   [place] bx 9:00 heart shape, USUAL DUCTAL HYPERPLASIA   BREAST LUMPECTOMY Right 03/09/2018   Procedure: BREAST LUMPECTOMY x 2;  Surgeon: Benjamine Sprague, DO;  Location: [facility] ORS;  Service: General;  Laterality: Right;   CATARACT EXTRACTION W/PHACO Left 06/11/2016   Procedure: CATARACT EXTRACTION PHACO AND INTRAOCULAR LENS PLACEMENT (IOC);  Surgeon: Estill Cotta, MD;  Location: [facility] ORS;  Service: Ophthalmology;  Laterality: Left;   Lot # [id number] H US:01:38.6 AP%:26.4 CDE:44.15   CATARACT EXTRACTION W/PHACO Right 06/15/2018   Procedure: CATARACT EXTRACTION PHACO AND INTRAOCULAR LENS PLACEMENT (IOC);  Surgeon: Birder Robson, MD;  Location: [facility] ORS;  Service: Ophthalmology;  Laterality: Right;  [place] 00:38.2 CDE 4.23 Fluid Pack Lot # [id number]   COLONOSCOPIES     COLONOSCOPY WITH PROPOFOL N/A 10/10/2020   Procedure: COLONOSCOPY WITH PROPOFOL;  Surgeon: Lesly Rubenstein, MD;  [facility] END[facility]COPY;  Service: Endoscopy;  Laterality: N/A;   COLONOSCOPY WITH PROPOFOL N/A 11/20/2020   Procedure: COLONOSCOPY WITH PROPOFOL;  Surgeon: Lesly Rubenstein, MD;  Location: [facility] ENDOSCOPY;  Service: Endoscopy;  Laterality: N/A;  DM STAT CBC, BMP COVID POSITIVE 09/02/2020   CTR     ESOPHAGOGASTRODUODENOSCOPY (EGD) WITH PROPOFOL N/A 10/10/2020   Procedure: ESOPHAGOGASTRODUODENOSCOPY (EGD) WITH PROPOFOL;  Surgeon: Lesly Rubenstein, MD;  Location: [facility] ENDOSCOPY;  Service: Endoscopy;  Laterality: N/A;  COVID POSITIVE 10/08/2020   JOINT REPLACEMENT     KNEE SURGERY Right 09/24/2017   plates and pins   TOTAL SHOULDER ARTHROPLASTY Right 09/27/2015    There were Huber vitals filed for this visit.   Subjective Assessment - 04/25/21 1442     Subjective "not too bad". Reports some soreness  this past week.    Currently in Pain? Yes    Pain Score 1     Pain Location --   genralized   Pain Descriptors / Indicators Sore                               [facility] Adult PT Treatment/Exercise - 04/25/21 0001       Knee/Hip Exercises: Aerobic   Nustep 4 min dynamic warmup      Knee/Hip Exercises: Standing   Heel Raises 20 reps    Heel Raises Limitations toe raise x20    Hip Abduction 2 sets;10 reps    Abduction Limitations 3lb    Hip Extension Both;2 sets;10 reps;Knee straight    Extension Limitations 3lb    SLS with Vectors 3 x 5" 3 rounds each    Other Standing Knee Exercises sidestepping 3 RT with 3 lb weights  in parallel bars    Other Standing Knee Exercises tandem gait in // bars 3 RT      Knee/Hip Exercises: Seated   Sit to Sand 10 reps;without UE support                       PT Short Term Goals - 04/12/21 1316       PT SHORT TERM GOAL #1   Title Patient will be independent with HEP in order to improve functional outcomes.    Baseline in development    Time 3    Period Weeks    Status Achieved    Target Date 04/08/21      PT SHORT TERM GOAL #2   Title Patient will report at least 25% improvement in symptoms for improved quality of life.    Baseline 50% improvement    Time 3    Period Weeks    Status Achieved    Target Date 02/25/21      PT SHORT TERM GOAL #3   Title Demo improved gait tolerance as evidenced by distance of 150 ft during 2MWT    Baseline 174 with RW    Time 3    Period Weeks    Status Achieved    Target Date 02/25/21               PT Long Term Goals - 04/12/21 1316       PT LONG TERM GOAL #1   Title Patient will report at least 75% improvement in symptoms for improved quality of life.    Baseline 50% improvement    Time 6    Period Weeks    Status On-going      PT LONG TERM GOAL #2   Title Patient will be able to ambulate at least 226 feet in 2MWT with LRAD in order to demonstrate improved gait speed for community ambulation.    Baseline 174 with RW    Time 6    Period Weeks    Status On-going      PT LONG TERM GOAL #3   Title Pt will be able to perform 5xSTS in 15 sec or < with Huber UE support and proper mechanics to demo improved balance and functional BLE strength.    Baseline 31 sec from 18" seat height with BUE support    Time 6    Period Weeks    Status On-going  Plan - 04/25/21 1539     Clinical Impression Statement Progressed balance activity this date. Patient well challenged with tandem gait and vector stance requiring intermittent HHA. Patient also noting increased muscle fatigue and  slight discomfort in RT knee during sit to stands. Educated patient on carry over of hip and glute strength to improved knee function and balance. Patient cued on LE positioning during vector stance. Patient will continue to benefit from skilled therapy services to reduce deficits and improve functional ability.    Personal Factors and Comorbidities Comorbidity 1;Time since onset of injury/illness/exacerbation;Past/Current Experience;Comorbidity 2;Fitness    Comorbidities PMH and hx of mutiple physical trauma, Cirrhosis    Examination-Activity Limitations Bathing;Bed Mobility;Bend;Lift;Toileting;Stand;Squat;Locomotion Level;Transfers    Examination-Participation Restrictions Cleaning;Community Activity;Driving;Shop;Occupation;Meal Prep;Yard Work;Volunteer    Stability/Clinical Decision Making Stable/Uncomplicated    Rehab Potential Good    PT Frequency 2x / week    PT Duration 6 weeks    PT Treatment/Interventions ADLs/Self Care Home Management;Aquatic Therapy;Biofeedback;Cryotherapy;Electrical Stimulation;DME Instruction;Ultrasound;Moist Heat;Gait training;Stair training;Functional mobility training;Therapeutic activities;Therapeutic exercise;Balance training;Patient/family education;Neuromuscular re-education;Manual techniques;Taping;Energy conservation;Dry needling;Spinal Manipulations;Joint Manipulations;Compression bandaging;Manual lymph drainage    PT Next Visit Plan contiue gait training with cane. Reassess per end of cert    PT Home Exercise Plan marching, LAQ, ankle pumps 6/28 sit/stands; 02/21/21: begin walking program 8/30 heel raise, toe raise, sidestepping, tandem stance    Consulted and Agree with Plan of Care Patient             Patient will benefit from skilled therapeutic intervention in order to improve the following deficits and impairments:  Abnormal gait, Decreased activity tolerance, Decreased balance, Decreased mobility, Decreased knowledge of use of DME, Decreased endurance,  Decreased range of motion, Decreased strength, Difficulty walking, Impaired perceived functional ability, Improper body mechanics, Pain  Visit Diagnosis: Difficulty in walking, not elsewhere classified  Other abnormalities of gait and mobility  Muscle weakness (generalized)     Problem List Patient Active Problem List   Diagnosis Date Noted   Iron deficiency anemia due to chronic blood loss 02/13/2021   History of uterine cancer 02/13/2021   Decompensated hepatic cirrhosis ([place]) 10/25/2020   OSA (obstructive sleep apnea) 10/25/2020   Glossitis 10/25/2020   Grade IV internal hemorrhoids    Acute blood loss anemia 10/08/2020   Hyponatremia 10/08/2020   Rectal bleeding 10/08/2020   Acute respiratory failure with hypoxia ([place]) 09/10/2020   Pneumonia due to COVID-19 virus 09/08/2020   Hip fracture, right ([place]) 09/04/2020   Closed right hip fracture ([place]) 09/02/2020   Diabetes mellitus with hyperglycemia ([place]) 09/02/2020   Hypertension    Thalassemia minor    Pancytopenia ([place])    HLD (hyperlipidemia)    Acute renal failure superimposed on stage 3a chronic kidney disease ([place])    Depression    Fall at home, initial encounter    Nondisplaced fracture of greater trochanter of right femur, initial encounter for closed fracture ([place])    3:41 PM, 04/25/21 Josue Hector PT DPT  Physical Therapist with [place] [facility]  [phone] [place] [address] [place], [place], [address] Phone: [phone]   Fax:  [phone]  Name: Rachel Huber MRN: [id number] Date of Birth: September 28, 1955

## 2021-04-30 ENCOUNTER — Encounter (HOSPITAL_COMMUNITY): Payer: Managed Care, Other (non HMO) | Admitting: Physical Therapy

## 2021-05-02 ENCOUNTER — Other Ambulatory Visit: Payer: Self-pay

## 2021-05-02 ENCOUNTER — Encounter (HOSPITAL_COMMUNITY): Payer: Self-pay

## 2021-05-02 ENCOUNTER — Ambulatory Visit (HOSPITAL_COMMUNITY): Payer: 59

## 2021-05-02 DIAGNOSIS — R262 Difficulty in walking, not elsewhere classified: Secondary | ICD-10-CM | POA: Diagnosis not present

## 2021-05-02 DIAGNOSIS — R2689 Other abnormalities of gait and mobility: Secondary | ICD-10-CM

## 2021-05-02 DIAGNOSIS — M6281 Muscle weakness (generalized): Secondary | ICD-10-CM

## 2021-05-02 NOTE — Therapy (Signed)
[place] [address] [place], [place], [address] Phone: [phone]   Fax:  [phone]  Physical Therapy Treatment/Progress Note/Re-Evaluation  Patient Details  Name: Rachel Huber MRN: [id number] Date of Birth: July 13, 1956 Referring Provider (PT): Mortimer Fries PA-C  Progress Note Reporting Period 05/02/2021 to 06/06/2021  See note below for Objective Data and Assessment of Progress/Goals.     Encounter Date: 05/02/2021   PT End of Session - 05/02/21 1110     Visit Number 17    Number of Visits 24    Date for PT Re-Evaluation 04/29/21    Authorization Type [employer]ign[employer]    Authorization Time Period VL based on medical necessity, 60 total    Authorization - Visit Number 39    Authorization - Number of Visits 60    Progress Note Due on Visit 20    PT Start Time 1105    PT Stop Time 1146    PT Time Calculation (min) 41 min    Activity Tolerance Patient tolerated treatment well    Behavior During Therapy WFL for tasks assessed/performed             Past Medical History:  Diagnosis Date   Anxiety    Arthritis    Cirrhosis of liver ([place])    Diabetes mellitus without complication ([place])    Dyspnea    DOE   GERD (gastroesophageal reflux disease)    Hepatitis    PAST   Hypertension    Neuropathy    Neuropathy, diabetic ([place])    Pneumonia    Sinus complaint    Sleep apnea    CPAP   Thalassemia minor 1992    Past Surgical History:  Procedure Laterality Date   ABDOMINAL HYSTERECTOMY     BREAST BIOPSY Right 02/15/2018   [place] bx 6-6:30 ribbon shape, ONE CORE FRAGMENT WITH FIBROSIS. ONE CORE FRAGMENT WITH PORTION OF A DILATED   BREAST BIOPSY Right 02/15/2018   [place] bx 9:00 heart shape, USUAL DUCTAL HYPERPLASIA   BREAST LUMPECTOMY Right 03/09/2018   Procedure: BREAST LUMPECTOMY x 2;  Surgeon: Benjamine Sprague, DO;  Location: [facility] ORS;  Service: General;  Laterality: Right;   CATARACT EXTRACTION W/PHACO Left 06/11/2016   Procedure:  CATARACT EXTRACTION PHACO AND INTRAOCULAR LENS PLACEMENT (IOC);  Surgeon: Estill Cotta, MD;  Location: [facility] ORS;  Service: Ophthalmology;  Laterality: Left;  Lot # [id number] H US:01:38.6 AP%:26.4 CDE:44.15   CATARACT EXTRACTION W/PHACO Right 06/15/2018   Procedure: CATARACT EXTRACTION PHACO AND INTRAOCULAR LENS PLACEMENT (IOC);  Surgeon: Birder Robson, MD;  Location: [facility] ORS;  Service: Ophthalmology;  Laterality: Right;  [place] 00:38.2 CDE 4.23 Fluid Pack Lot # [id number]   COLONOSCOPIES     COLONOSCOPY WITH PROPOFOL N/A 10/10/2020   Procedure: COLONOSCOPY WITH PROPOFOL;  Surgeon: Lesly Rubenstein, MD;  Location: [facility] ENDOSCOPY;  Service: Endoscopy;  Laterality: N/A;   COLONOSCOPY WITH PROPOFOL N/A 11/20/2020   Procedure: COLONOSCOPY WITH PROPOFOL;  Surgeon: Lesly Rubenstein, MD;  Location: [facility] ENDOSCOPY;  Service: Endoscopy;  Laterality: N/A;  DM STAT CBC, BMP COVID POSITIVE 09/02/2020   CTR     ESOPHAGOGASTRODUODENOSCOPY (EGD) WITH PROPOFOL N/A 10/10/2020   Procedure: ESOPHAGOGASTRODUODENOSCOPY (EGD) WITH PROPOFOL;  Surgeon: Lesly Rubenstein, MD;  Location: [facility] ENDOSCOPY;  Service: Endoscopy;  Laterality: N/A;  COVID POSITIVE 10/08/2020   JOINT REPLACEMENT     KNEE SURGERY Right 09/24/2017   plates and pins   TOTAL SHOULDER ARTHROPLASTY Right 09/27/2015    There were no vitals  filed for this visit.   Subjective Assessment - 05/02/21 1108     Subjective Definitely feeling better since beginning of PT.  Hip is uncomfortable when she first wakes up on the morning but it quits after a few steps. Uses SPC now instead of RW. Walking program limited due to gravel driveway, uneven ground and endurance for activity.    Currently in Pain? No/denies                [facility] PT Assessment - 05/02/21 0001       Functional Tests   Functional tests Sit to Stand      Sit to Stand   Comments 24 seconds 5xSTS without UE assist               [facility] Adult PT Treatment/Exercise -  05/02/21 0001       Ambulation/Gait   Gait Comments 2MWT 250 feet with SPC      Knee/Hip Exercises: Standing   Heel Raises Limitations toe raise x20    Knee Flexion Both;2 sets;10 reps    Knee Flexion Limitations 3lb    Hip Abduction 2 sets;10 reps;Both    Abduction Limitations 3lb    Hip Extension Both;2 sets;10 reps;Knee straight    Extension Limitations 3lb    Other Standing Knee Exercises sidestepping 3 RT with 3 lb weights in parallel bars                     PT Education - 05/02/21 1110     Education Details Progress and plan moving forward. Homemade 3# ankle weights.    Person(s) Educated Patient    Methods Explanation    Comprehension Verbalized understanding              PT Short Term Goals - 05/02/21 1111       PT SHORT TERM GOAL #1   Title Patient will be independent with HEP in order to improve functional outcomes.    Baseline 05/02/21 - patient reports performing daily.    Time 3    Period Weeks    Status Achieved    Target Date 04/08/21      PT SHORT TERM GOAL #2   Title Patient will report at least 25% improvement in symptoms for improved quality of life.    Baseline 05/02/21 - 80% improvement    Time 3    Period Weeks    Status Achieved    Target Date 02/25/21      PT SHORT TERM GOAL #3   Title Demo improved gait tolerance as evidenced by distance of 150 ft during 2MWT    Baseline 05/02/21 - 2MWT    Time 3    Period Weeks    Status Achieved    Target Date 02/25/21               PT Long Term Goals - 05/02/21 1112       PT LONG TERM GOAL #1   Title Patient will report at least 75% improvement in symptoms for improved quality of life.    Baseline 05/02/21 - 80% improvement    Time 6    Period Weeks    Status Achieved      PT LONG TERM GOAL #2   Title Patient will be able to ambulate at least 226 feet in 2MWT with LRAD in order to demonstrate improved gait speed for community ambulation.    Baseline 05/02/21 - 250 feet in  2MWT  Time 6    Period Weeks    Status Achieved      PT LONG TERM GOAL #3   Title Pt will be able to perform 5xSTS in 15 sec or < with no UE support and proper mechanics to demo improved balance and functional BLE strength.    Baseline 05/02/21 - 24 sec from 18" seat height without BUE support    Time 6    Period Weeks    Status On-going                   Plan - 05/02/21 1110     Clinical Impression Statement Progress note/re-evaluation performed and review of goals progress to date. Patient able to complete 2MWT covering 250 feet with SPC and supervision assistance. This was 95 feet with RW initially. Patient has met 3/3 short term goals and 2/3 long term goals. Patient completed 5xSTS in 23 seconds. Goal to complete in less than 15 seconds to decrease risk for falls and indicate improved balance and functional strength. Walking program about 5 minutes in house about 1x/day limited by fatigue/endurance. Discussed making homemade ankle weights for hip abduction, extension and hamstring curl exercises. Patient to continue skilled physical therapy 1x/wk x5 weeks to complete advanced HEP and ensure independence and correct performance.    Personal Factors and Comorbidities Comorbidity 1;Time since onset of injury/illness/exacerbation;Past/Current Experience;Comorbidity 2;Fitness    Comorbidities PMH and hx of mutiple physical trauma, Cirrhosis    Examination-Activity Limitations Bathing;Bed Mobility;Bend;Lift;Toileting;Stand;Squat;Locomotion Level;Transfers    Examination-Participation Restrictions Cleaning;Community Activity;Driving;Shop;Occupation;Meal Prep;Yard Work;Volunteer    Stability/Clinical Decision Making Stable/Uncomplicated    Rehab Potential Good    PT Frequency 1x / week    PT Duration --   5 weeks   PT Treatment/Interventions ADLs/Self Care Home Management;Aquatic Therapy;Biofeedback;Cryotherapy;Electrical Stimulation;DME Instruction;Ultrasound;Moist Heat;Gait  training;Stair training;Functional mobility training;Therapeutic activities;Therapeutic exercise;Balance training;Patient/family education;Neuromuscular re-education;Manual techniques;Taping;Energy conservation;Dry needling;Spinal Manipulations;Joint Manipulations;Compression bandaging;Manual lymph drainage    PT Next Visit Plan contiue gait training with cane. Advance HEP to maximize strength and balance challenges.    PT Home Exercise Plan marching, LAQ, ankle pumps 6/28 sit/stands; 02/21/21: begin walking program 8/30 heel raise, toe raise, sidestepping, tandem stance    Consulted and Agree with Plan of Care Patient             Patient will benefit from skilled therapeutic intervention in order to improve the following deficits and impairments:  Abnormal gait, Decreased activity tolerance, Decreased balance, Decreased mobility, Decreased knowledge of use of DME, Decreased endurance, Decreased range of motion, Decreased strength, Difficulty walking, Impaired perceived functional ability, Improper body mechanics, Pain  Visit Diagnosis: Difficulty in walking, not elsewhere classified - Plan: PT plan of care cert/re-cert  Other abnormalities of gait and mobility - Plan: PT plan of care cert/re-cert  Muscle weakness (generalized) - Plan: PT plan of care cert/re-cert     Problem List Patient Active Problem List   Diagnosis Date Noted   Iron deficiency anemia due to chronic blood loss 02/13/2021   History of uterine cancer 02/13/2021   Decompensated hepatic cirrhosis ([place]) 10/25/2020   OSA (obstructive sleep apnea) 10/25/2020   Glossitis 10/25/2020   Grade IV internal hemorrhoids    Acute blood loss anemia 10/08/2020   Hyponatremia 10/08/2020   Rectal bleeding 10/08/2020   Acute respiratory failure with hypoxia ([place]) 09/10/2020   Pneumonia due to COVID-19 virus 09/08/2020   Hip fracture, right ([place]) 09/04/2020   Closed right hip fracture ([place]) 09/02/2020   Diabetes mellitus with  hyperglycemia (  [place]) 09/02/2020   Hypertension    Thalassemia minor    Pancytopenia ([place])    HLD (hyperlipidemia)    Acute renal failure superimposed on stage 3a chronic kidney disease ([place])    Depression    Fall at home, initial encounter    Nondisplaced fracture of greater trochanter of right femur, initial encounter for closed fracture ([place])    Floria Raveling. Hartnett-Rands, MS, PT Per [place] [phone]  Jeannie Done, PT 05/02/2021, 2:26 PM  [facility] [address] [place], [place], [address] Phone: [phone]   Fax:  [phone]  Name: KIOWA PEIFER MRN: [id number] Date of Birth: 1956/08/18

## 2021-05-07 ENCOUNTER — Ambulatory Visit (HOSPITAL_COMMUNITY): Payer: 59

## 2021-05-07 ENCOUNTER — Encounter (HOSPITAL_COMMUNITY): Payer: Self-pay

## 2021-05-07 ENCOUNTER — Other Ambulatory Visit: Payer: Self-pay

## 2021-05-07 DIAGNOSIS — R262 Difficulty in walking, not elsewhere classified: Secondary | ICD-10-CM

## 2021-05-07 DIAGNOSIS — M6281 Muscle weakness (generalized): Secondary | ICD-10-CM

## 2021-05-07 DIAGNOSIS — R2689 Other abnormalities of gait and mobility: Secondary | ICD-10-CM

## 2021-05-07 NOTE — Patient Instructions (Signed)
Bridge    Lie back, legs bent. Inhale, pressing hips up. Keeping ribs in, lengthen lower back. Exhale, rolling down along spine from top. Repeat 10 times. Do 2 sessions per day.  [email or web address]   Copyright  VHI. All rights reserved.

## 2021-05-07 NOTE — Therapy (Signed)
[place], [place], [address] Phone: [phone]   Fax:  [phone]  Physical Therapy Treatment  Patient Details  Name: Rachel Huber MRN: [id number] Date of Birth: December 12, 1955 Referring Provider (PT): Mortimer Fries PA-C   Encounter Date: 05/07/2021   PT End of Session - 05/07/21 1641     Visit Number 18    Number of Visits 24    Date for PT Re-Evaluation 04/29/21    Authorization Type [employer]    Authorization Time Period VL based on medical necessity, 60 total    Authorization - Visit Number 21    Authorization - Number of Visits 60    Progress Note Due on Visit 24    PT Start Time 1616    PT Stop Time 1704    PT Time Calculation (min) 48 min    Equipment Utilized During Treatment Gait belt   SPC   Activity Tolerance Patient tolerated treatment well    Behavior During Therapy WFL for tasks assessed/performed             Past Medical History:  Diagnosis Date   Anxiety    Arthritis    Cirrhosis of liver ([place])    Diabetes mellitus without complication ([place])    Dyspnea    DOE   GERD (gastroesophageal reflux disease)    Hepatitis    PAST   Hypertension    Neuropathy    Neuropathy, diabetic ([place])    Pneumonia    Sinus complaint    Sleep apnea    CPAP   Thalassemia minor 1992    Past Surgical History:  Procedure Laterality Date   ABDOMINAL HYSTERECTOMY     BREAST BIOPSY Right 02/15/2018   [place] bx 6-6:30 ribbon shape, ONE CORE FRAGMENT WITH FIBROSIS. ONE CORE FRAGMENT WITH PORTION OF A DILATED   BREAST BIOPSY Right 02/15/2018   [place] bx 9:00 heart shape, USUAL DUCTAL HYPERPLASIA   BREAST LUMPECTOMY Right 03/09/2018   Procedure: BREAST LUMPECTOMY x 2;  Surgeon: Benjamine Sprague, DO;  Location: [facility] ORS;  Service: General;  Laterality: Right;   CATARACT EXTRACTION W/PHACO Left 06/11/2016   Procedure: CATARACT EXTRACTION PHACO AND INTRAOCULAR LENS PLACEMENT (IOC);  Surgeon: Estill Cotta, MD;  Location:  [facility] ORS;  Service: Ophthalmology;  Laterality: Left;  Lot # [id number] H US:01:38.6 AP%:26.4 CDE:44.15   CATARACT EXTRACTION W/PHACO Right 06/15/2018   Procedure: CATARACT EXTRACTION PHACO AND INTRAOCULAR LENS PLACEMENT (IOC);  Surgeon: Birder Robson, MD;  Location: [facility] ORS;  Service: Ophthalmology;  Laterality: Right;  [place] 00:38.2 CDE 4.23 Fluid Pack Lot # [id number]   COLONOSCOPIES     COLONOSCOPY WITH PROPOFOL N/A 10/10/2020   Procedure: COLONOSCOPY WITH PROPOFOL;  Surgeon: Lesly Rubenstein, MD;  Location: [facility] ENDOSCOPY;  Service: Endoscopy;  Laterality: N/A;   COLONOSCOPY WITH PROPOFOL N/A 11/20/2020   Procedure: COLONOSCOPY WITH PROPOFOL;  Surgeon: Lesly Rubenstein, MD;  Location: [facility] ENDOSCOPY;  Service: Endoscopy;  Laterality: N/A;  DM STAT CBC, BMP COVID POSITIVE 09/02/2020   CTR     ESOPHAGOGASTRODUODENOSCOPY (EGD) WITH PROPOFOL N/A 10/10/2020   Procedure: ESOPHAGOGASTRODUODENOSCOPY (EGD) WITH PROPOFOL;  Surgeon: Lesly Rubenstein, MD;  Location: [facility] ENDOSCOPY;  Service: Endoscopy;  Laterality: N/A;  COVID POSITIVE 10/08/2020   JOINT REPLACEMENT     KNEE SURGERY Right 09/24/2017   plates and pins   TOTAL SHOULDER ARTHROPLASTY Right 09/27/2015    There were no vitals filed for this visit.   Subjective Assessment - 05/07/21 1618  Subjective Pt stated she is feeling good, has been walking with SPC for 2 days, no reports of recent fall.    Patient Stated Goals improve walking    Currently in Pain? No/denies                               [facility] Adult PT Treatment/Exercise - 05/07/21 0001       Knee/Hip Exercises: Machines for Strengthening   Other Machine [employer] retrogait 30#      Knee/Hip Exercises: Standing   SLS with Vectors 3 x 5" BLE    Other Standing Knee Exercises [employer] retrogait 30#      Knee/Hip Exercises: Seated   Sit to Sand 10 reps;without UE support      Knee/Hip Exercises: Supine   Bridges 2 sets;10 reps    Bridges  Limitations 5" holds      Knee/Hip Exercises: Prone   Other Prone Exercises Quadruped hip extension 10x 5"holds    Other Prone Exercises Quadruped UE 5x5"                       PT Short Term Goals - 05/02/21 1111       PT SHORT TERM GOAL #1   Title Patient will be independent with HEP in order to improve functional outcomes.    Baseline 05/02/21 - patient reports performing daily.    Time 3    Period Weeks    Status Achieved    Target Date 04/08/21      PT SHORT TERM GOAL #2   Title Patient will report at least 25% improvement in symptoms for improved quality of life.    Baseline 05/02/21 - 80% improvement    Time 3    Period Weeks    Status Achieved    Target Date 02/25/21      PT SHORT TERM GOAL #3   Title Demo improved gait tolerance as evidenced by distance of 150 ft during 2MWT    Baseline 05/02/21 - 2MWT    Time 3    Period Weeks    Status Achieved    Target Date 02/25/21               PT Long Term Goals - 05/02/21 1112       PT LONG TERM GOAL #1   Title Patient will report at least 75% improvement in symptoms for improved quality of life.    Baseline 05/02/21 - 80% improvement    Time 6    Period Weeks    Status Achieved      PT LONG TERM GOAL #2   Title Patient will be able to ambulate at least 226 feet in 2MWT with LRAD in order to demonstrate improved gait speed for community ambulation.    Baseline 05/02/21 - 250 feet in 2MWT    Time 6    Period Weeks    Status Achieved      PT LONG TERM GOAL #3   Title Pt will be able to perform 5xSTS in 15 sec or < with no UE support and proper mechanics to demo improved balance and functional BLE strength.    Baseline 05/02/21 - 24 sec from 18" seat height without BUE support    Time 6    Period Weeks    Status On-going  Plan - 05/07/21 1650     Clinical Impression Statement Pt ambulated with SPC through session with no LOB episodes, does present with slow controllled  movements.  Added hip strengthening exercises that was tolerated well.  Increased challenge wiht quadruped activities.  Added bridges with long holds to HEP wiht printout given.  Pt required HHA with balance associated exercises today.  No reports of pain through session, was limited by fatigue and did require periodic seated rest breaks through session.    Personal Factors and Comorbidities Comorbidity 1;Time since onset of injury/illness/exacerbation;Past/Current Experience;Comorbidity 2;Fitness    Comorbidities PMH and hx of mutiple physical trauma, Cirrhosis    Examination-Activity Limitations Bathing;Bed Mobility;Bend;Lift;Toileting;Stand;Squat;Locomotion Level;Transfers    Examination-Participation Restrictions Cleaning;Community Activity;Driving;Shop;Occupation;Meal Prep;Yard Work;Volunteer    Clinical Decision Making Low    Rehab Potential Good    PT Frequency 1x / week    PT Duration --   5 weeks   PT Treatment/Interventions ADLs/Self Care Home Management;Aquatic Therapy;Biofeedback;Cryotherapy;Electrical Stimulation;DME Instruction;Ultrasound;Moist Heat;Gait training;Stair training;Functional mobility training;Therapeutic activities;Therapeutic exercise;Balance training;Patient/family education;Neuromuscular re-education;Manual techniques;Taping;Energy conservation;Dry needling;Spinal Manipulations;Joint Manipulations;Compression bandaging;Manual lymph drainage    PT Next Visit Plan contiue gait training with cane. Advance HEP to maximize strength and balance challenges.    PT Home Exercise Plan marching, LAQ, ankle pumps 6/28 sit/stands; 02/21/21: begin walking program 8/30 heel raise, toe raise, sidestepping, tandem stance; 9/20: bridge    Consulted and Agree with Plan of Care Patient             Patient will benefit from skilled therapeutic intervention in order to improve the following deficits and impairments:  Abnormal gait, Decreased activity tolerance, Decreased balance, Decreased  mobility, Decreased knowledge of use of DME, Decreased endurance, Decreased range of motion, Decreased strength, Difficulty walking, Impaired perceived functional ability, Improper body mechanics, Pain  Visit Diagnosis: Difficulty in walking, not elsewhere classified  Muscle weakness (generalized)  Other abnormalities of gait and mobility     Problem List Patient Active Problem List   Diagnosis Date Noted   Iron deficiency anemia due to chronic blood loss 02/13/2021   History of uterine cancer 02/13/2021   Decompensated hepatic cirrhosis ([place]) 10/25/2020   OSA (obstructive sleep apnea) 10/25/2020   Glossitis 10/25/2020   Grade IV internal hemorrhoids    Acute blood loss anemia 10/08/2020   Hyponatremia 10/08/2020   Rectal bleeding 10/08/2020   Acute respiratory failure with hypoxia ([place]) 09/10/2020   Pneumonia due to COVID-19 virus 09/08/2020   Hip fracture, right ([place]) 09/04/2020   Closed right hip fracture ([place]) 09/02/2020   Diabetes mellitus with hyperglycemia ([place]) 09/02/2020   Hypertension    Thalassemia minor    Pancytopenia ([place])    HLD (hyperlipidemia)    Acute renal failure superimposed on stage 3a chronic kidney disease ([place])    Depression    Fall at home, initial encounter    Nondisplaced fracture of greater trochanter of right femur, initial encounter for closed fracture [facility])    Ihor Austin, LPTA/CLT; CBIS [phone]  Aldona Lento, PTA 05/07/2021, 5:13 PM  [place], [place], [address] Phone: [phone]   Fax:  [phone]  Name: Rachel Huber MRN: [id number] Date of Birth: August 03, 1956

## 2021-05-09 ENCOUNTER — Ambulatory Visit (HOSPITAL_COMMUNITY): Payer: 59

## 2021-05-09 ENCOUNTER — Other Ambulatory Visit: Payer: Self-pay | Admitting: Gastroenterology

## 2021-05-09 ENCOUNTER — Other Ambulatory Visit: Payer: Self-pay

## 2021-05-09 ENCOUNTER — Encounter (HOSPITAL_COMMUNITY): Payer: Self-pay

## 2021-05-09 DIAGNOSIS — R262 Difficulty in walking, not elsewhere classified: Secondary | ICD-10-CM | POA: Diagnosis not present

## 2021-05-09 DIAGNOSIS — K7460 Unspecified cirrhosis of liver: Secondary | ICD-10-CM

## 2021-05-09 DIAGNOSIS — R2689 Other abnormalities of gait and mobility: Secondary | ICD-10-CM

## 2021-05-09 DIAGNOSIS — M6281 Muscle weakness (generalized): Secondary | ICD-10-CM

## 2021-05-09 NOTE — Therapy (Signed)
[place], [place], [address] Phone: [phone]   Fax:  [phone]  Physical Therapy Treatment  Patient Details  Name: Rachel Huber MRN: [id number] Date of Birth: 1955/10/08 Referring Provider (PT): Mortimer Fries PA-C   Encounter Date: 05/09/2021   PT End of Session - 05/09/21 1150     Visit Number 19    Number of Visits 24    Date for PT Re-Evaluation 06/06/21    Authorization Type [employer]    Authorization Time Period VL based on medical necessity, 60 total    Authorization - Visit Number 43    Authorization - Number of Visits 60    Progress Note Due on Visit 24    PT Start Time 1119    PT Stop Time 1202    PT Time Calculation (min) 43 min    Equipment Utilized During Treatment Gait belt   SPC   Activity Tolerance Patient tolerated treatment well    Behavior During Therapy WFL for tasks assessed/performed             Past Medical History:  Diagnosis Date   Anxiety    Arthritis    Cirrhosis of liver ([place])    Diabetes mellitus without complication ([place])    Dyspnea    DOE   GERD (gastroesophageal reflux disease)    Hepatitis    PAST   Hypertension    Neuropathy    Neuropathy, diabetic ([place])    Pneumonia    Sinus complaint    Sleep apnea    CPAP   Thalassemia minor 1992    Past Surgical History:  Procedure Laterality Date   ABDOMINAL HYSTERECTOMY     BREAST BIOPSY Right 02/15/2018   [place] bx 6-6:30 ribbon shape, ONE CORE FRAGMENT WITH FIBROSIS. ONE CORE FRAGMENT WITH PORTION OF A DILATED   BREAST BIOPSY Right 02/15/2018   [place] bx 9:00 heart shape, USUAL DUCTAL HYPERPLASIA   BREAST LUMPECTOMY Right 03/09/2018   Procedure: BREAST LUMPECTOMY x 2;  Surgeon: Benjamine Sprague, DO;  Location: [facility] ORS;  Service: General;  Laterality: Right;   CATARACT EXTRACTION W/PHACO Left 06/11/2016   Procedure: CATARACT EXTRACTION PHACO AND INTRAOCULAR LENS PLACEMENT (IOC);  Surgeon: Estill Cotta, MD;  Location:  [facility] ORS;  Service: Ophthalmology;  Laterality: Left;  Lot # [id number] H US:01:38.6 AP%:26.4 CDE:44.15   CATARACT EXTRACTION W/PHACO Right 06/15/2018   Procedure: CATARACT EXTRACTION PHACO AND INTRAOCULAR LENS PLACEMENT (IOC);  Surgeon: Birder Robson, MD;  Location: [facility] ORS;  Service: Ophthalmology;  Laterality: Right;  [place] 00:38.2 CDE 4.23 Fluid Pack Lot # [id number]   COLONOSCOPIES     COLONOSCOPY WITH PROPOFOL N/A 10/10/2020   Procedure: COLONOSCOPY WITH PROPOFOL;  Surgeon: Lesly Rubenstein, MD;  Location: [facility] ENDOSCOPY;  Service: Endoscopy;  Laterality: N/A;   COLONOSCOPY WITH PROPOFOL N/A 11/20/2020   Procedure: COLONOSCOPY WITH PROPOFOL;  Surgeon: Lesly Rubenstein, MD;  Location: [facility] ENDOSCOPY;  Service: Endoscopy;  Laterality: N/A;  DM STAT CBC, BMP COVID POSITIVE 09/02/2020   CTR     ESOPHAGOGASTRODUODENOSCOPY (EGD) WITH PROPOFOL N/A 10/10/2020   Procedure: ESOPHAGOGASTRODUODENOSCOPY (EGD) WITH PROPOFOL;  Surgeon: Lesly Rubenstein, MD;  Location: [facility] ENDOSCOPY;  Service: Endoscopy;  Laterality: N/A;  COVID POSITIVE 10/08/2020   JOINT REPLACEMENT     KNEE SURGERY Right 09/24/2017   plates and pins   TOTAL SHOULDER ARTHROPLASTY Right 09/27/2015    There were no vitals filed for this visit.   Subjective Assessment - 05/09/21 1123  Subjective Pt reports sore from last session, using SPC without falls.    Patient Stated Goals improve walking    Currently in Pain? No/denies                 [facility] Adult PT Treatment/Exercise - 05/09/21 0001       Knee/Hip Exercises: Standing   Heel Raises 20 reps    Heel Raises Limitations on slant    Forward Lunges 10 reps    Forward Lunges Limitations forward lunge, return to feet together, in // bars    Side Lunges 10 reps    Side Lunges Limitations side lunge, return to feet together, in // bars    Other Standing Knee Exercises romberg stance, BUE reaching up holding dowel x10 reps; romberg stance BUE punching out holding  dowel, 10 reps; romberg stance with BUE twisting R/L holding dowel, 10 reps    Other Standing Knee Exercises tandem gait on blue line with [facility]                     PT Education - 05/09/21 1207     Education Details Continue HEP, exercise technique    Person(s) Educated Patient    Methods Explanation;Demonstration    Comprehension Verbalized understanding;Returned demonstration              PT Short Term Goals - 05/02/21 1111       PT SHORT TERM GOAL #1   Title Patient will be independent with HEP in order to improve functional outcomes.    Baseline 05/02/21 - patient reports performing daily.    Time 3    Period Weeks    Status Achieved    Target Date 04/08/21      PT SHORT TERM GOAL #2   Title Patient will report at least 25% improvement in symptoms for improved quality of life.    Baseline 05/02/21 - 80% improvement    Time 3    Period Weeks    Status Achieved    Target Date 02/25/21      PT SHORT TERM GOAL #3   Title Demo improved gait tolerance as evidenced by distance of 150 ft during 2MWT    Baseline 05/02/21 - 2MWT    Time 3    Period Weeks    Status Achieved    Target Date 02/25/21               PT Long Term Goals - 05/02/21 1112       PT LONG TERM GOAL #1   Title Patient will report at least 75% improvement in symptoms for improved quality of life.    Baseline 05/02/21 - 80% improvement    Time 6    Period Weeks    Status Achieved      PT LONG TERM GOAL #2   Title Patient will be able to ambulate at least 226 feet in 2MWT with LRAD in order to demonstrate improved gait speed for community ambulation.    Baseline 05/02/21 - 250 feet in 2MWT    Time 6    Period Weeks    Status Achieved      PT LONG TERM GOAL #3   Title Pt will be able to perform 5xSTS in 15 sec or < with no UE support and proper mechanics to demo improved balance and functional BLE strength.    Baseline 05/02/21 - 24 sec from 18" seat height without BUE support     Time  6    Period Weeks    Status On-going                   Plan - 05/09/21 1208     Clinical Impression Statement Added forward and side lunges with return to standing with feet together to strengthen stepping reaction and LE strength. Continued static and dynamic balance training, pt challenged with dynamic demonstrating poor stepping reaction during tandem gait requiring assistance to avoid fall and VCs to use stepping strategy instead of reaching with arms to grab objects. Reviewed HEP and progressing to higher level balance with less UE support as exercises become easy, progressing to HEP at home or gym once discharging from therapy. Discussed changing COG due to fluid build up/draining causing challenging pt with balance in everyday life. Continue to progress as able.    Personal Factors and Comorbidities Comorbidity 1;Time since onset of injury/illness/exacerbation;Past/Current Experience;Comorbidity 2;Fitness    Comorbidities PMH and hx of mutiple physical trauma, Cirrhosis    Examination-Activity Limitations Bathing;Bed Mobility;Bend;Lift;Toileting;Stand;Squat;Locomotion Level;Transfers    Examination-Participation Restrictions Cleaning;Community Activity;Driving;Shop;Occupation;Meal Prep;Yard Work;Volunteer    Rehab Potential Good    PT Frequency 1x / week    PT Duration --   5 weeks   PT Treatment/Interventions ADLs/Self Care Home Management;Aquatic Therapy;Biofeedback;Cryotherapy;Electrical Stimulation;DME Instruction;Ultrasound;Moist Heat;Gait training;Stair training;Functional mobility training;Therapeutic activities;Therapeutic exercise;Balance training;Patient/family education;Neuromuscular re-education;Manual techniques;Taping;Energy conservation;Dry needling;Spinal Manipulations;Joint Manipulations;Compression bandaging;Manual lymph drainage    PT Next Visit Plan contiue gait training with cane. Advance HEP to maximize strength and balance challenges.    PT Home Exercise  Plan marching, LAQ, ankle pumps 6/28 sit/stands; 02/21/21: begin walking program 8/30 heel raise, toe raise, sidestepping, tandem stance; 9/20: bridge; 9/22: decrease dependence on UE support in safe space with side lunges and forward lunges (at counter top)    Consulted and Agree with Plan of Care Patient             Patient will benefit from skilled therapeutic intervention in order to improve the following deficits and impairments:  Abnormal gait, Decreased activity tolerance, Decreased balance, Decreased mobility, Decreased knowledge of use of DME, Decreased endurance, Decreased range of motion, Decreased strength, Difficulty walking, Impaired perceived functional ability, Improper body mechanics, Pain  Visit Diagnosis: Difficulty in walking, not elsewhere classified  Muscle weakness (generalized)  Other abnormalities of gait and mobility     Problem List Patient Active Problem List   Diagnosis Date Noted   Iron deficiency anemia due to chronic blood loss 02/13/2021   History of uterine cancer 02/13/2021   Decompensated hepatic cirrhosis ([place]) 10/25/2020   OSA (obstructive sleep apnea) 10/25/2020   Glossitis 10/25/2020   Grade IV internal hemorrhoids    Acute blood loss anemia 10/08/2020   Hyponatremia 10/08/2020   Rectal bleeding 10/08/2020   Acute respiratory failure with hypoxia ([place]) 09/10/2020   Pneumonia due to COVID-19 virus 09/08/2020   Hip fracture, right ([place]) 09/04/2020   Closed right hip fracture ([place]) 09/02/2020   Diabetes mellitus with hyperglycemia ([place]) 09/02/2020   Hypertension    Thalassemia minor    Pancytopenia ([place])    HLD (hyperlipidemia)    Acute renal failure superimposed on stage 3a chronic kidney disease ([place])    Depression    Fall at home, initial encounter    Nondisplaced fracture of greater trochanter of right femur, initial encounter for closed fracture ([place])       Talbot Grumbling PT, DPT 05/09/21, 12:25 PM    [place] [address]  [place], [place], [address] Phone: [phone]   Fax:  [phone]  Name: Rachel Huber MRN: [id number] Date of Birth: [birth year]

## 2021-05-10 ENCOUNTER — Ambulatory Visit
Admission: RE | Admit: 2021-05-10 | Discharge: 2021-05-10 | Disposition: A | Discharge status: Home / Self Care | Payer: 59 | Source: Ambulatory Visit | Attending: Gastroenterology | Admitting: Gastroenterology

## 2021-05-10 DIAGNOSIS — K7460 Unspecified cirrhosis of liver: Secondary | ICD-10-CM | POA: Diagnosis not present

## 2021-05-10 LAB — BODY FLUID CELL COUNT WITH DIFFERENTIAL
Eos, Fluid: 0 %
Lymphs, Fluid: 47 %
Monocyte-Macrophage-Serous Fluid: 45 %
Neutrophil Count, Fluid: 8 %
Other Cells, Fluid: 0 %
Total Nucleated Cell Count, Fluid: 109 cu mm

## 2021-05-10 LAB — PROTEIN, PLEURAL OR PERITONEAL FLUID: Total protein, fluid: <3 g/dL

## 2021-05-10 LAB — ALBUMIN, PLEURAL OR PERITONEAL FLUID: Albumin, Fluid: 1.4 g/dL

## 2021-05-10 MED ORDER — ALBUMIN HUMAN 25 % IV SOLN
INTRAVENOUS | Status: AC
  Administered 2021-05-10: 25 g via INTRAVENOUS
  Filled 2021-05-10: qty 100

## 2021-05-10 MED ORDER — ALBUMIN HUMAN 25 % IV SOLN: 25.0000 g | Freq: Once | INTRAVENOUS | Status: AC

## 2021-05-10 NOTE — Procedures (Signed)
PROCEDURE SUMMARY:  Successful US guided paracentesis from RUQ.  Yielded 11.6 Liters of clear yellow fluid.  No immediate complications.  Pt tolerated well.   Specimen was sent for labs.  EBL < 23m  MRockney Ghee9/23/2022 9:49 AM

## 2021-05-12 LAB — PROTEIN, BODY FLUID (OTHER): Total Protein, Body Fluid Other: 2.4 g/dL

## 2021-05-13 LAB — CYTOLOGY - NON PAP

## 2021-05-15 ENCOUNTER — Other Ambulatory Visit: Payer: Self-pay

## 2021-05-15 ENCOUNTER — Ambulatory Visit (HOSPITAL_COMMUNITY): Payer: 59

## 2021-05-15 DIAGNOSIS — R262 Difficulty in walking, not elsewhere classified: Secondary | ICD-10-CM | POA: Diagnosis not present

## 2021-05-15 DIAGNOSIS — M6281 Muscle weakness (generalized): Secondary | ICD-10-CM

## 2021-05-15 DIAGNOSIS — R2689 Other abnormalities of gait and mobility: Secondary | ICD-10-CM

## 2021-05-15 NOTE — Therapy (Signed)
[place], [place], [address] Phone: [phone]   Fax:  [phone]  Physical Therapy Treatment  Patient Details  Name: Rachel Huber MRN: [id number] Date of Birth: [birth year] Referring Provider (PT): Mortimer Fries PA-C   Encounter Date: 05/15/2021   PT End of Session - 05/15/21 1004     Visit Number 20    Number of Visits 24    Date for PT Re-Evaluation 06/06/21    Authorization Type [employer]    Authorization Time Period VL based on medical necessity, 60 total    Authorization - Number of Visits 60    Progress Note Due on Visit 24    PT Start Time 0958    PT Stop Time 1040    PT Time Calculation (min) 42 min    Equipment Utilized During Treatment Gait belt   SPC   Activity Tolerance Patient tolerated treatment well    Behavior During Therapy WFL for tasks assessed/performed             Past Medical History:  Diagnosis Date   Anxiety    Arthritis    Cirrhosis of liver ([place])    Diabetes mellitus without complication ([place])    Dyspnea    DOE   GERD (gastroesophageal reflux disease)    Hepatitis    PAST   Hypertension    Neuropathy    Neuropathy, diabetic ([place])    Pneumonia    Sinus complaint    Sleep apnea    CPAP   Thalassemia minor 1992    Past Surgical History:  Procedure Laterality Date   ABDOMINAL HYSTERECTOMY     BREAST BIOPSY Right 02/15/2018   [place] bx 6-6:30 ribbon shape, ONE CORE FRAGMENT WITH FIBROSIS. ONE CORE FRAGMENT WITH PORTION OF A DILATED   BREAST BIOPSY Right 02/15/2018   [place] bx 9:00 heart shape, USUAL DUCTAL HYPERPLASIA   BREAST LUMPECTOMY Right 03/09/2018   Procedure: BREAST LUMPECTOMY x 2;  Surgeon: Benjamine Sprague, DO;  Location: [facility] ORS;  Service: General;  Laterality: Right;   CATARACT EXTRACTION W/PHACO Left 06/11/2016   Procedure: CATARACT EXTRACTION PHACO AND INTRAOCULAR LENS PLACEMENT (IOC);  Surgeon: Estill Cotta, MD;  Location: [facility] ORS;  Service: Ophthalmology;   Laterality: Left;  Lot # [id number] H US:01:38.6 AP%:26.4 CDE:44.15   CATARACT EXTRACTION W/PHACO Right 06/15/2018   Procedure: CATARACT EXTRACTION PHACO AND INTRAOCULAR LENS PLACEMENT (IOC);  Surgeon: Birder Robson, MD;  Location: [facility] ORS;  Service: Ophthalmology;  Laterality: Right;  [place] 00:38.2 CDE 4.23 Fluid Pack Lot # [id number]   COLONOSCOPIES     COLONOSCOPY WITH PROPOFOL N/A 10/10/2020   Procedure: COLONOSCOPY WITH PROPOFOL;  Surgeon: Lesly Rubenstein, MD;  Location: [facility] ENDOSCOPY;  Service: Endoscopy;  Laterality: N/A;   COLONOSCOPY WITH PROPOFOL N/A 11/20/2020   Procedure: COLONOSCOPY WITH PROPOFOL;  Surgeon: Lesly Rubenstein, MD;  Location: [facility] ENDOSCOPY;  Service: Endoscopy;  Laterality: N/A;  DM STAT CBC, BMP COVID POSITIVE 09/02/2020   CTR     ESOPHAGOGASTRODUODENOSCOPY (EGD) WITH PROPOFOL N/A 10/10/2020   Procedure: ESOPHAGOGASTRODUODENOSCOPY (EGD) WITH PROPOFOL;  Surgeon: Lesly Rubenstein, MD;  Location: [facility] ENDOSCOPY;  Service: Endoscopy;  Laterality: N/A;  COVID POSITIVE 10/08/2020   JOINT REPLACEMENT     KNEE SURGERY Right 09/24/2017   plates and pins   TOTAL SHOULDER ARTHROPLASTY Right 09/27/2015    There were no vitals filed for this visit.   Subjective Assessment - 05/15/21 1001     Subjective Feeling good today,  no reports of pain.  Arrived with SPC, no reports of recent fall and reports Rt hand sharp pain has resolved.    Patient Stated Goals improve walking    Currently in Pain? No/denies                               [facility] Adult PT Treatment/Exercise - 05/15/21 0001       Ambulation/Gait   Assistive device Straight cane;None    Gait Comments 2MWT 266 ft      Knee/Hip Exercises: Standing   Forward Lunges 10 reps    Forward Lunges Limitations forward lunge, return to feet together, in // bars    Side Lunges 10 reps    Side Lunges Limitations side lunge, return to feet together, in // bars    Other Standing Knee Exercises  romberg stance on foam, BUE reaching up holding dowel x10 reps; romberg stance BUE punching out holding dowel, 10 reps; romberg stance with BUE twisting R/L holding dowel, 10 reps; tandem stance wiht 5 head turns on foam    Other Standing Knee Exercises tandem gait on blue line with SPC; sidestep with RTB around calves      Knee/Hip Exercises: Seated   Sit to Sand 2 sets;5 reps;without UE support   5 STS 24"                      PT Short Term Goals - 05/02/21 1111       PT SHORT TERM GOAL #1   Title Patient will be independent with HEP in order to improve functional outcomes.    Baseline 05/02/21 - patient reports performing daily.    Time 3    Period Weeks    Status Achieved    Target Date 04/08/21      PT SHORT TERM GOAL #2   Title Patient will report at least 25% improvement in symptoms for improved quality of life.    Baseline 05/02/21 - 80% improvement    Time 3    Period Weeks    Status Achieved    Target Date 02/25/21      PT SHORT TERM GOAL #3   Title Demo improved gait tolerance as evidenced by distance of 150 ft during 2MWT    Baseline 05/02/21 - 2MWT    Time 3    Period Weeks    Status Achieved    Target Date 02/25/21               PT Long Term Goals - 05/02/21 1112       PT LONG TERM GOAL #1   Title Patient will report at least 75% improvement in symptoms for improved quality of life.    Baseline 05/02/21 - 80% improvement    Time 6    Period Weeks    Status Achieved      PT LONG TERM GOAL #2   Title Patient will be able to ambulate at least 226 feet in 2MWT with LRAD in order to demonstrate improved gait speed for community ambulation.    Baseline 05/02/21 - 250 feet in 2MWT    Time 6    Period Weeks    Status Achieved      PT LONG TERM GOAL #3   Title Pt will be able to perform 5xSTS in 15 sec or < with no UE support and proper mechanics to demo improved balance and functional  BLE strength.    Baseline 05/02/21 - 24 sec from 18" seat  height without BUE support    Time 6    Period Weeks    Status On-going                   Plan - 05/15/21 1305     Clinical Impression Statement Session focus with balance activities incorporated with functional strengthening.  Pt continues to required HHA with stepping strategies to reduce LOB.  Improved confidence noted during 2MWT and ability to ambulate faster cadence wihtout AD, pt did carry [facility] for safety.  EOS pt limited by fatigue, no reoprts of pain through session.    Personal Factors and Comorbidities Comorbidity 1;Time since onset of injury/illness/exacerbation;Past/Current Experience;Comorbidity 2;Fitness    Comorbidities PMH and hx of mutiple physical trauma, Cirrhosis    Examination-Activity Limitations Bathing;Bed Mobility;Bend;Lift;Toileting;Stand;Squat;Locomotion Level;Transfers    Examination-Participation Restrictions Cleaning;Community Activity;Driving;Shop;Occupation;Meal Prep;Yard Work;Volunteer    Stability/Clinical Decision Making Stable/Uncomplicated    Clinical Decision Making Low    Rehab Potential Good    PT Frequency 1x / week    PT Duration --   5 weeks   PT Treatment/Interventions ADLs/Self Care Home Management;Aquatic Therapy;Biofeedback;Cryotherapy;Electrical Stimulation;DME Instruction;Ultrasound;Moist Heat;Gait training;Stair training;Functional mobility training;Therapeutic activities;Therapeutic exercise;Balance training;Patient/family education;Neuromuscular re-education;Manual techniques;Taping;Energy conservation;Dry needling;Spinal Manipulations;Joint Manipulations;Compression bandaging;Manual lymph drainage    PT Next Visit Plan contiue gait training with cane. Advance HEP to maximize strength and balance challenges.    PT Home Exercise Plan marching, LAQ, ankle pumps 6/28 sit/stands; 02/21/21: begin walking program 8/30 heel raise, toe raise, sidestepping, tandem stance; 9/20: bridge; 9/22: decrease dependence on UE support in safe space with  side lunges and forward lunges (at counter top)    Consulted and Agree with Plan of Care Patient             Patient will benefit from skilled therapeutic intervention in order to improve the following deficits and impairments:  Abnormal gait, Decreased activity tolerance, Decreased balance, Decreased mobility, Decreased knowledge of use of DME, Decreased endurance, Decreased range of motion, Decreased strength, Difficulty walking, Impaired perceived functional ability, Improper body mechanics, Pain  Visit Diagnosis: Difficulty in walking, not elsewhere classified  Muscle weakness (generalized)  Other abnormalities of gait and mobility     Problem List Patient Active Problem List   Diagnosis Date Noted   Iron deficiency anemia due to chronic blood loss 02/13/2021   History of uterine cancer 02/13/2021   Decompensated hepatic cirrhosis ([place]) 10/25/2020   OSA (obstructive sleep apnea) 10/25/2020   Glossitis 10/25/2020   Grade IV internal hemorrhoids    Acute blood loss anemia 10/08/2020   Hyponatremia 10/08/2020   Rectal bleeding 10/08/2020   Acute respiratory failure with hypoxia ([place]) 09/10/2020   Pneumonia due to COVID-19 virus 09/08/2020   Hip fracture, right ([place]) 09/04/2020   Closed right hip fracture ([place]) 09/02/2020   Diabetes mellitus with hyperglycemia ([place]) 09/02/2020   Hypertension    Thalassemia minor    Pancytopenia ([place])    HLD (hyperlipidemia)    Acute renal failure superimposed on stage 3a chronic kidney disease ([place])    Depression    Fall at home, initial encounter    Nondisplaced fracture of greater trochanter of right femur, initial encounter for closed fracture [facility])    Ihor Austin, LPTA/CLT; CBIS [phone]  Aldona Lento, PTA 05/15/2021, 1:10 PM  [place], [place], [address] Phone: [phone]   Fax:  [phone]  Name:  Rachel Huber MRN: [id number] Date of  Birth: 11-Feb-1956

## 2021-05-22 ENCOUNTER — Other Ambulatory Visit: Payer: Self-pay

## 2021-05-22 ENCOUNTER — Ambulatory Visit (HOSPITAL_COMMUNITY): Payer: 59 | Attending: Internal Medicine | Admitting: Physical Therapy

## 2021-05-22 DIAGNOSIS — R262 Difficulty in walking, not elsewhere classified: Secondary | ICD-10-CM | POA: Insufficient documentation

## 2021-05-22 DIAGNOSIS — R2689 Other abnormalities of gait and mobility: Secondary | ICD-10-CM | POA: Diagnosis present

## 2021-05-22 DIAGNOSIS — M6281 Muscle weakness (generalized): Secondary | ICD-10-CM | POA: Insufficient documentation

## 2021-05-22 NOTE — Therapy (Signed)
[place], [place], [address] Phone: [phone]   Fax:  [phone]  Physical Therapy Treatment  Patient Details  Name: NATALE BARBA MRN: [id number] Date of Birth: [birth year] Referring Provider (PT): Mortimer Fries PA-C   Encounter Date: 05/22/2021   PT End of Session - 05/22/21 1357     Visit Number 21    Number of Visits 24    Date for PT Re-Evaluation 06/06/21    Authorization Type [employer]    Authorization Time Period VL based on medical necessity, 60 total    Authorization - Visit Number 23    Authorization - Number of Visits 60    Progress Note Due on Visit 24    PT Start Time 1335   pt 10 minutes late then 10 minutes in bathroom   PT Stop Time 1400    PT Time Calculation (min) 25 min    Equipment Utilized During Treatment Gait belt   SPC   Activity Tolerance Patient tolerated treatment well    Behavior During Therapy WFL for tasks assessed/performed             Past Medical History:  Diagnosis Date   Anxiety    Arthritis    Cirrhosis of liver ([place])    Diabetes mellitus without complication ([place])    Dyspnea    DOE   GERD (gastroesophageal reflux disease)    Hepatitis    PAST   Hypertension    Neuropathy    Neuropathy, diabetic ([place])    Pneumonia    Sinus complaint    Sleep apnea    CPAP   Thalassemia minor 1992    Past Surgical History:  Procedure Laterality Date   ABDOMINAL HYSTERECTOMY     BREAST BIOPSY Right 02/15/2018   [place] bx 6-6:30 ribbon shape, ONE CORE FRAGMENT WITH FIBROSIS. ONE CORE FRAGMENT WITH PORTION OF A DILATED   BREAST BIOPSY Right 02/15/2018   [place] bx 9:00 heart shape, USUAL DUCTAL HYPERPLASIA   BREAST LUMPECTOMY Right 03/09/2018   Procedure: BREAST LUMPECTOMY x 2;  Surgeon: Benjamine Sprague, DO;  Location: [facility] ORS;  Service: General;  Laterality: Right;   CATARACT EXTRACTION W/PHACO Left 06/11/2016   Procedure: CATARACT EXTRACTION PHACO AND INTRAOCULAR LENS PLACEMENT  (IOC);  Surgeon: Estill Cotta, MD;  Location: [facility] ORS;  Service: Ophthalmology;  Laterality: Left;  Lot # [id number] H US:01:38.6 AP%:26.4 CDE:44.15   CATARACT EXTRACTION W/PHACO Right 06/15/2018   Procedure: CATARACT EXTRACTION PHACO AND INTRAOCULAR LENS PLACEMENT (IOC);  Surgeon: Birder Robson, MD;  Location: [facility] ORS;  Service: Ophthalmology;  Laterality: Right;  [place] 00:38.2 CDE 4.23 Fluid Pack Lot # [id number]   COLONOSCOPIES     COLONOSCOPY WITH PROPOFOL N/A 10/10/2020   Procedure: COLONOSCOPY WITH PROPOFOL;  Surgeon: Lesly Rubenstein, MD;  Location: [facility] ENDOSCOPY;  Service: Endoscopy;  Laterality: N/A;   COLONOSCOPY WITH PROPOFOL N/A 11/20/2020   Procedure: COLONOSCOPY WITH PROPOFOL;  Surgeon: Lesly Rubenstein, MD;  Location: [facility] ENDOSCOPY;  Service: Endoscopy;  Laterality: N/A;  DM STAT CBC, BMP COVID POSITIVE 09/02/2020   CTR     ESOPHAGOGASTRODUODENOSCOPY (EGD) WITH PROPOFOL N/A 10/10/2020   Procedure: ESOPHAGOGASTRODUODENOSCOPY (EGD) WITH PROPOFOL;  Surgeon: Lesly Rubenstein, MD;  Location: [facility] ENDOSCOPY;  Service: Endoscopy;  Laterality: N/A;  COVID POSITIVE 10/08/2020   JOINT REPLACEMENT     KNEE SURGERY Right 09/24/2017   plates and pins   TOTAL SHOULDER ARTHROPLASTY Right 09/27/2015    There were no vitals filed  for this visit.   Subjective Assessment - 05/22/21 1345     Subjective pt states she has to use the restroom and would like to work on steps today.  Pt without any complaints or issues today.    Currently in Pain? No/denies                               [facility] Adult PT Treatment/Exercise - 05/22/21 0001       Knee/Hip Exercises: Standing   Forward Lunges 15 reps    Forward Lunges Limitations forward lunge onto step no UE's for stability    Side Lunges 15 reps    Side Lunges Limitations side lunge onto 4" step no UE    Stairs 4" reciprocally with 1 HR and SPC 2RT, 7" reciprocally up and step to down with 1 HR and SPC                        PT Short Term Goals - 05/02/21 1111       PT SHORT TERM GOAL #1   Title Patient will be independent with HEP in order to improve functional outcomes.    Baseline 05/02/21 - patient reports performing daily.    Time 3    Period Weeks    Status Achieved    Target Date 04/08/21      PT SHORT TERM GOAL #2   Title Patient will report at least 25% improvement in symptoms for improved quality of life.    Baseline 05/02/21 - 80% improvement    Time 3    Period Weeks    Status Achieved    Target Date 02/25/21      PT SHORT TERM GOAL #3   Title Demo improved gait tolerance as evidenced by distance of 150 ft during 2MWT    Baseline 05/02/21 - 2MWT    Time 3    Period Weeks    Status Achieved    Target Date 02/25/21               PT Long Term Goals - 05/02/21 1112       PT LONG TERM GOAL #1   Title Patient will report at least 75% improvement in symptoms for improved quality of life.    Baseline 05/02/21 - 80% improvement    Time 6    Period Weeks    Status Achieved      PT LONG TERM GOAL #2   Title Patient will be able to ambulate at least 226 feet in 2MWT with LRAD in order to demonstrate improved gait speed for community ambulation.    Baseline 05/02/21 - 250 feet in 2MWT    Time 6    Period Weeks    Status Achieved      PT LONG TERM GOAL #3   Title Pt will be able to perform 5xSTS in 15 sec or < with no UE support and proper mechanics to demo improved balance and functional BLE strength.    Baseline 05/02/21 - 24 sec from 18" seat height without BUE support    Time 6    Period Weeks    Status On-going                   Plan - 05/22/21 1401     Clinical Impression Statement Pt was late for appt then had to use the restroom when entered gym.  Limited activities completed this session.  Pt request to work on negotiate steps today as she is to visit someone who has stairs going into their home.   No difficulty with 4" height  completing reciprocally, however use of HR and AD.  Unable to descend reciprocally with higher 7" step and with challenge ascending reciprocally.    Personal Factors and Comorbidities Comorbidity 1;Time since onset of injury/illness/exacerbation;Past/Current Experience;Comorbidity 2;Fitness    Comorbidities PMH and hx of mutiple physical trauma, Cirrhosis    Examination-Activity Limitations Bathing;Bed Mobility;Bend;Lift;Toileting;Stand;Squat;Locomotion Level;Transfers    Examination-Participation Restrictions Cleaning;Community Activity;Driving;Shop;Occupation;Meal Prep;Yard Work;Volunteer    Stability/Clinical Decision Making Stable/Uncomplicated    Rehab Potential Good    PT Frequency 1x / week    PT Duration --   5 weeks   PT Treatment/Interventions ADLs/Self Care Home Management;Aquatic Therapy;Biofeedback;Cryotherapy;Electrical Stimulation;DME Instruction;Ultrasound;Moist Heat;Gait training;Stair training;Functional mobility training;Therapeutic activities;Therapeutic exercise;Balance training;Patient/family education;Neuromuscular re-education;Manual techniques;Taping;Energy conservation;Dry needling;Spinal Manipulations;Joint Manipulations;Compression bandaging;Manual lymph drainage    PT Next Visit Plan contiue gait training with cane. Advance HEP to maximize strength and balance challenges. continue practice with stairs    PT Home Exercise Plan marching, LAQ, ankle pumps 6/28 sit/stands; 02/21/21: begin walking program 8/30 heel raise, toe raise, sidestepping, tandem stance; 9/20: bridge; 9/22: decrease dependence on UE support in safe space with side lunges and forward lunges (at counter top)    Consulted and Agree with Plan of Care Patient             Patient will benefit from skilled therapeutic intervention in order to improve the following deficits and impairments:  Abnormal gait, Decreased activity tolerance, Decreased balance, Decreased mobility, Decreased knowledge of use of DME,  Decreased endurance, Decreased range of motion, Decreased strength, Difficulty walking, Impaired perceived functional ability, Improper body mechanics, Pain  Visit Diagnosis: Difficulty in walking, not elsewhere classified  Muscle weakness (generalized)  Other abnormalities of gait and mobility     Problem List Patient Active Problem List   Diagnosis Date Noted   Iron deficiency anemia due to chronic blood loss 02/13/2021   History of uterine cancer 02/13/2021   Decompensated hepatic cirrhosis ([place]) 10/25/2020   OSA (obstructive sleep apnea) 10/25/2020   Glossitis 10/25/2020   Grade IV internal hemorrhoids    Acute blood loss anemia 10/08/2020   Hyponatremia 10/08/2020   Rectal bleeding 10/08/2020   Acute respiratory failure with hypoxia ([place]) 09/10/2020   Pneumonia due to COVID-19 virus 09/08/2020   Hip fracture, right ([place]) 09/04/2020   Closed right hip fracture ([place]) 09/02/2020   Diabetes mellitus with hyperglycemia ([place]) 09/02/2020   Hypertension    Thalassemia minor    Pancytopenia ([place])    HLD (hyperlipidemia)    Acute renal failure superimposed on stage 3a chronic kidney disease ([place])    Depression    Fall at home, initial encounter    Nondisplaced fracture of greater trochanter of right femur, initial encounter for closed fracture [facility])     Teena Irani, PTA 05/22/2021, 2:09 PM  [place], [place], [address] Phone: [phone]   Fax:  [phone]  Name: NICOLINA HIRT MRN: [id number] Date of Birth: 10-Feb-1956

## 2021-05-24 ENCOUNTER — Other Ambulatory Visit: Payer: Self-pay

## 2021-05-24 ENCOUNTER — Encounter (HOSPITAL_COMMUNITY): Payer: Self-pay | Admitting: Physical Therapy

## 2021-05-24 ENCOUNTER — Ambulatory Visit (HOSPITAL_COMMUNITY): Payer: 59 | Admitting: Physical Therapy

## 2021-05-24 DIAGNOSIS — R262 Difficulty in walking, not elsewhere classified: Secondary | ICD-10-CM | POA: Diagnosis not present

## 2021-05-24 DIAGNOSIS — R2689 Other abnormalities of gait and mobility: Secondary | ICD-10-CM

## 2021-05-24 DIAGNOSIS — M6281 Muscle weakness (generalized): Secondary | ICD-10-CM

## 2021-05-24 NOTE — Therapy (Signed)
[facility] [address] [place], [place], [address] Phone: [phone]   Fax:  [phone]  Physical Therapy Treatment and  Discharge Note and Progress NOTE  Patient Details  Name: Rachel Huber MRN: [id number] Date of Birth: 1955-09-04 Referring Provider (PT): Mortimer Fries PA-C  Progress Note Reporting Period 915/2// to 05/24/21  See note below for Objective Data and Assessment of Progress/Goals.     PHYSICAL THERAPY DISCHARGE SUMMARY  Visits from Start of Care: 22  Current functional level related to goals / functional outcomes: See below   Remaining deficits: See below   Education / Equipment: See below   Patient agrees to discharge. Patient goals were partially met. Patient is being discharged due to being pleased with the current functional level.    Encounter Date: 05/24/2021   PT End of Session - 05/24/21 1001     Visit Number 22    Number of Visits 24    Date for PT Re-Evaluation 06/06/21    Authorization Type [employer]    Authorization Time Period VL based on medical necessity, 60 total    Authorization - Visit Number 24    Authorization - Number of Visits 60    Progress Note Due on Visit 24    PT Start Time 1002    PT Stop Time 1045    PT Time Calculation (min) 43 min    Equipment Utilized During Treatment Gait belt   SPC   Activity Tolerance Patient tolerated treatment well    Behavior During Therapy WFL for tasks assessed/performed             Past Medical History:  Diagnosis Date   Anxiety    Arthritis    Cirrhosis of liver ([place])    Diabetes mellitus without complication ([place])    Dyspnea    DOE   GERD (gastroesophageal reflux disease)    Hepatitis    PAST   Hypertension    Neuropathy    Neuropathy, diabetic ([place])    Pneumonia    Sinus complaint    Sleep apnea    CPAP   Thalassemia minor 1992    Past Surgical History:  Procedure Laterality Date   ABDOMINAL HYSTERECTOMY     BREAST  BIOPSY Right 02/15/2018   [place] bx 6-6:30 ribbon shape, ONE CORE FRAGMENT WITH FIBROSIS. ONE CORE FRAGMENT WITH PORTION OF A DILATED   BREAST BIOPSY Right 02/15/2018   [place] bx 9:00 heart shape, USUAL DUCTAL HYPERPLASIA   BREAST LUMPECTOMY Right 03/09/2018   Procedure: BREAST LUMPECTOMY x 2;  Surgeon: Benjamine Sprague, DO;  Location: [facility] ORS;  Service: General;  Laterality: Right;   CATARACT EXTRACTION W/PHACO Left 06/11/2016   Procedure: CATARACT EXTRACTION PHACO AND INTRAOCULAR LENS PLACEMENT (IOC);  Surgeon: Estill Cotta, MD;  Location: [facility] ORS;  Service: Ophthalmology;  Laterality: Left;  Lot # [id number] H US:01:38.6 AP%:26.4 CDE:44.15   CATARACT EXTRACTION W/PHACO Right 06/15/2018   Procedure: CATARACT EXTRACTION PHACO AND INTRAOCULAR LENS PLACEMENT (IOC);  Surgeon: Birder Robson, MD;  Location: [facility] ORS;  Service: Ophthalmology;  Laterality: Right;  [place] 00:38.2 CDE 4.23 Fluid Pack Lot # [id number]   COLONOSCOPIES     COLONOSCOPY WITH PROPOFOL N/A 10/10/2020   Procedure: COLONOSCOPY WITH PROPOFOL;  Surgeon: Lesly Rubenstein, MD;  [facility];  Service: Endoscopy;  Laterality: N/A;   COLONOSCOPY WITH PROPOFOL N/A 11/20/2020   Procedure: COLONOSCOPY WITH PROPOFOL;  Surgeon: Lesly Rubenstein, MD;  Location: [facility] ENDOSCOPY;  Service: Endoscopy;  Laterality:  N/A;  DM STAT CBC, BMP COVID POSITIVE 09/02/2020   CTR     ESOPHAGOGASTRODUODENOSCOPY (EGD) WITH PROPOFOL N/A 10/10/2020   Procedure: ESOPHAGOGASTRODUODENOSCOPY (EGD) WITH PROPOFOL;  Surgeon: Lesly Rubenstein, MD;  Location: [facility] ENDOSCOPY;  Service: Endoscopy;  Laterality: N/A;  COVID POSITIVE 10/08/2020   JOINT REPLACEMENT     KNEE SURGERY Right 09/24/2017   plates and pins   TOTAL SHOULDER ARTHROPLASTY Right 09/27/2015    There were no vitals filed for this visit.   Subjective Assessment - 05/24/21 1005     Subjective States overall she feels about 80-85% better. States she doesn't feel like she is there yet.  States that she is still having difficulty with balance and using a cane and has some strength deficits.    Currently in Pain? No/denies                [facility] PT Assessment - 05/24/21 0001       Assessment   Medical Diagnosis R hand pain, Weakness, Cirrhosis of liver, weakness/balance    Referring Provider (PT) Mortimer Fries PA-C      Transfers   Five time sit to stand comments  18.95 seconds no arms, initially was 31 seconds      Ambulation/Gait   Ambulation/Gait Yes    Ambulation/Gait Assistance 5: Supervision    Ambulation Distance (Feet) 185 Feet    Assistive device None    Gait Pattern Wide base of support;Trunk flexed;Decreased stride length;Decreased arm swing - left;Decreased arm swing - right;Decreased hip/knee flexion - right;Decreased hip/knee flexion - left;Decreased dorsiflexion - left;Decreased dorsiflexion - right   slow   Ambulation Surface Level;Indoor    Gait Comments 2MW, 270 feet with cane                           [facility] Adult PT Treatment/Exercise - 05/24/21 0001       Transfers   Comments floor transfer - 10 minutes - talked tactics and hoe to get on and off the floor0 no phsyical practice just demonstration                     PT Education - 05/24/21 1025     Education Details on [employer] program on HEP on current presentaion. on step moving, forward on floor transfers    Person(s) Educated Patient    Methods Explanation    Comprehension Verbalized understanding              PT Short Term Goals - 05/02/21 1111       PT SHORT TERM GOAL #1   Title Patient will be independent with HEP in order to improve functional outcomes.    Baseline 05/02/21 - patient reports performing daily.    Time 3    Period Weeks    Status Achieved    Target Date 04/08/21      PT SHORT TERM GOAL #2   Title Patient will report at least 25% improvement in symptoms for improved quality of life.    Baseline 05/02/21 - 80% improvement     Time 3    Period Weeks    Status Achieved    Target Date 02/25/21      PT SHORT TERM GOAL #3   Title Demo improved gait tolerance as evidenced by distance of 150 ft during 2MWT    Baseline 05/02/21 - 2MWT    Time 3    Period Weeks  Status Achieved    Target Date 02/25/21               PT Long Term Goals - 05/24/21 1007       PT LONG TERM GOAL #1   Title Patient will report at least 75% improvement in symptoms for improved quality of life.    Baseline 05/02/21 - 80% improvement    Time 6    Period Weeks    Status Achieved      PT LONG TERM GOAL #2   Title Patient will be able to ambulate at least 226 feet in 2MWT with LRAD in order to demonstrate improved gait speed for community ambulation.    Baseline 05/02/21 - 250 feet in 2MWT    Time 6    Period Weeks    Status Achieved      PT LONG TERM GOAL #3   Title Pt will be able to perform 5xSTS in 15 sec or < with no UE support and proper mechanics to demo improved balance and functional BLE strength.    Baseline 10/7 18.95 second no UE from standard chair height    Time 6    Period Weeks    Status On-going                   Plan - 05/24/21 1027     Clinical Impression Statement Overall patient is doing well and has met all but one long term goal at this time. Answered questions and patient to discharge from PT to HEP at this time secondary to progress made. Discussed floor transfer techniques for safe transfer up. Patient to discharge from PT to HEP at this time secondary to progress made.    Personal Factors and Comorbidities Comorbidity 1;Time since onset of injury/illness/exacerbation;Past/Current Experience;Comorbidity 2;Fitness    Comorbidities PMH and hx of mutiple physical trauma, Cirrhosis    Examination-Activity Limitations Bathing;Bed Mobility;Bend;Lift;Toileting;Stand;Squat;Locomotion Level;Transfers    Examination-Participation Restrictions Cleaning;Community Activity;Driving;Shop;Occupation;Meal  Prep;Yard Work;Volunteer    Stability/Clinical Decision Making Stable/Uncomplicated    Rehab Potential Good    PT Frequency 1x / week    PT Duration --   5 weeks   PT Treatment/Interventions ADLs/Self Care Home Management;Aquatic Therapy;Biofeedback;Cryotherapy;Electrical Stimulation;DME Instruction;Ultrasound;Moist Heat;Gait training;Stair training;Functional mobility training;Therapeutic activities;Therapeutic exercise;Balance training;Patient/family education;Neuromuscular re-education;Manual techniques;Taping;Energy conservation;Dry needling;Spinal Manipulations;Joint Manipulations;Compression bandaging;Manual lymph drainage    PT Next Visit Plan DC to HEP    PT Home Exercise Plan marching, LAQ, ankle pumps 6/28 sit/stands; 02/21/21: begin walking program 8/30 heel raise, toe raise, sidestepping, tandem stance; 9/20: bridge; 9/22: decrease dependence on UE support in safe space with side lunges and forward lunges (at counter top)    Consulted and Agree with Plan of Care Patient             Patient will benefit from skilled therapeutic intervention in order to improve the following deficits and impairments:  Abnormal gait, Decreased activity tolerance, Decreased balance, Decreased mobility, Decreased knowledge of use of DME, Decreased endurance, Decreased range of motion, Decreased strength, Difficulty walking, Impaired perceived functional ability, Improper body mechanics, Pain  Visit Diagnosis: Difficulty in walking, not elsewhere classified  Muscle weakness (generalized)  Other abnormalities of gait and mobility     Problem List Patient Active Problem List   Diagnosis Date Noted   Iron deficiency anemia due to chronic blood loss 02/13/2021   History of uterine cancer 02/13/2021   Decompensated hepatic cirrhosis ([place]) 10/25/2020   OSA (obstructive sleep apnea) 10/25/2020   Glossitis 10/25/2020  Grade IV internal hemorrhoids    Acute blood loss anemia 10/08/2020    Hyponatremia 10/08/2020   Rectal bleeding 10/08/2020   Acute respiratory failure with hypoxia ([place]) 09/10/2020   Pneumonia due to COVID-19 virus 09/08/2020   Hip fracture, right ([place]) 09/04/2020   Closed right hip fracture ([place]) 09/02/2020   Diabetes mellitus with hyperglycemia ([place]) 09/02/2020   Hypertension    Thalassemia minor    Pancytopenia ([place])    HLD (hyperlipidemia)    Acute renal failure superimposed on stage 3a chronic kidney disease ([place])    Depression    Fall at home, initial encounter    Nondisplaced fracture of greater trochanter of right femur, initial encounter for closed fracture [facility])     Eliezer Champagne, PT 05/24/2021, 10:56 AM  [facility] [place], [place], [address] Phone: [phone]   Fax:  [phone]  Name: Rachel Huber MRN: [id number] Date of Birth: [birth year]

## 2021-05-28 ENCOUNTER — Other Ambulatory Visit: Payer: Self-pay | Admitting: Gastroenterology

## 2021-05-28 ENCOUNTER — Encounter (HOSPITAL_COMMUNITY): Payer: 59 | Admitting: Physical Therapy

## 2021-05-28 DIAGNOSIS — R188 Other ascites: Secondary | ICD-10-CM

## 2021-05-28 DIAGNOSIS — K7460 Unspecified cirrhosis of liver: Secondary | ICD-10-CM

## 2021-05-31 ENCOUNTER — Ambulatory Visit
Admission: RE | Admit: 2021-05-31 | Discharge: 2021-05-31 | Disposition: A | Discharge status: Home / Self Care | Payer: 59 | Source: Ambulatory Visit | Attending: Gastroenterology | Admitting: Gastroenterology

## 2021-05-31 ENCOUNTER — Other Ambulatory Visit: Payer: Self-pay

## 2021-05-31 DIAGNOSIS — K7460 Unspecified cirrhosis of liver: Secondary | ICD-10-CM | POA: Insufficient documentation

## 2021-05-31 DIAGNOSIS — R188 Other ascites: Secondary | ICD-10-CM | POA: Insufficient documentation

## 2021-05-31 LAB — BODY FLUID CELL COUNT WITH DIFFERENTIAL
Eos, Fluid: 0 %
Lymphs, Fluid: 64 %
Monocyte-Macrophage-Serous Fluid: 27 %
Neutrophil Count, Fluid: 9 %
Total Nucleated Cell Count, Fluid: 213 cu mm

## 2021-05-31 LAB — PROTEIN, PLEURAL OR PERITONEAL FLUID: Total protein, fluid: <3 g/dL

## 2021-05-31 LAB — ALBUMIN, PLEURAL OR PERITONEAL FLUID: Albumin, Fluid: <1.5 g/dL

## 2021-05-31 MED ORDER — ALBUMIN HUMAN 25 % IV SOLN: 25.0000 g | Freq: Once | INTRAVENOUS | Status: AC

## 2021-05-31 MED ORDER — ALBUMIN HUMAN 25 % IV SOLN
INTRAVENOUS | Status: AC
  Administered 2021-05-31: 25 g via INTRAVENOUS
  Filled 2021-05-31: qty 100

## 2021-05-31 NOTE — OR Nursing (Signed)
Dr Haig Prophet informed of over 11 liters removed. Order for limit of 50 grams of Albumin.

## 2021-05-31 NOTE — H&P (Signed)
Vascular and Interventional Radiology  PRE PROCEDURE H&P  Assessment  Plan:   [name] is a 65 y.o. year old female who will undergo PARACENTESIS in Interventional Radiology.  The procedure has been fully reviewed with the patient/patient's authorized representative. The risks, benefits and alternatives have been explained, and the patient/patient's authorized representative has consented to the procedure.  HPI: [name] is a 65 y.o. 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 Last on 05/10/21 with ~12L removed.   Informed consent was obtained, witnessed and placed in the patient's chart.  Allergies:  Allergies  Allergen Reactions   Latex Rash    Contact rash    Medications:  Current Outpatient Medications on File Prior to Encounter  Medication Sig Dispense Refill   acetaminophen (TYLENOL) 650 MG CR tablet Take 1,300 mg by mouth daily as needed for pain.     atorvastatin (LIPITOR) 10 MG tablet Take 10 mg by mouth daily.     ciprofloxacin (CIPRO) 500 MG tablet Take 500 mg by mouth.     diphenhydrAMINE (BENADRYL) 25 MG tablet Take 25 mg by mouth at bedtime as needed.     furosemide (LASIX) 20 MG tablet Take by mouth.     gabapentin (NEURONTIN) 400 MG capsule Take 800 mg by mouth daily at 2 am.     guaiFENesin-dextromethorphan (ROBITUSSIN DM) 100-10 MG/5ML syrup Take 5 mLs by mouth every 4 (four) hours as needed for cough. 236 mL 0   melatonin 5 MG TABS Take 5 mg by mouth at bedtime as needed (sleep).     methocarbamol (ROBAXIN) 500 MG tablet Take 1 tablet (500 mg total) by mouth every 8 (eight) hours as needed for muscle spasms. 30 tablet 0   Multiple Vitamins-Minerals (MULTIVITAMIN WITH MINERALS) tablet Take 1 tablet by mouth daily.     ondansetron (ZOFRAN-ODT) 4 MG disintegrating tablet Take by mouth.     oxyCODONE-acetaminophen (PERCOCET/ROXICET) 5-325 MG tablet Take 1 tablet by mouth every 6  (six) hours as needed for severe pain. 14 tablet 0   oxymetazoline (AFRIN) 0.05 % nasal spray Place 1 spray into both nostrils 2 (two) times daily as needed.     sertraline (ZOLOFT) 50 MG tablet Take 50 mg by mouth daily.     spironolactone (ALDACTONE) 100 MG tablet Take by mouth.     TRULICITY 4.5 AS/5.0NL SOPN Inject 4.5 mg into the skin every Wednesday.     No current facility-administered medications on file prior to encounter.    PSH:  Past Surgical History:  Procedure Laterality Date   ABDOMINAL HYSTERECTOMY     BREAST BIOPSY Right 02/15/2018   [place] bx 6-6:30 ribbon shape, ONE CORE FRAGMENT WITH FIBROSIS. ONE CORE FRAGMENT WITH PORTION OF A DILATED   BREAST BIOPSY Right 02/15/2018   [place] bx 9:00 heart shape, USUAL DUCTAL HYPERPLASIA   BREAST LUMPECTOMY Right 03/09/2018   Procedure: BREAST LUMPECTOMY x 2;  Surgeon: Benjamine Sprague, DO;  Location: [facility] ORS;  Service: General;  Laterality: Right;   CATARACT EXTRACTION W/PHACO Left 06/11/2016   Procedure: CATARACT EXTRACTION PHACO AND INTRAOCULAR LENS PLACEMENT (IOC);  Surgeon: Estill Cotta, MD;  Location: [facility] ORS;  Service: Ophthalmology;  Laterality: Left;  Lot # [id number] H US:01:38.6 AP%:26.4 CDE:44.15   CATARACT EXTRACTION W/PHACO Right 06/15/2018   Procedure: CATARACT EXTRACTION PHACO AND INTRAOCULAR LENS PLACEMENT (IOC);  Surgeon: Birder Robson, MD;  Location: [facility] ORS;  Service: Ophthalmology;  Laterality: Right;  [place]  00:38.2 CDE 4.23 Fluid Pack Lot # [id number]   COLONOSCOPIES     COLONOSCOPY WITH PROPOFOL N/A 10/10/2020   Procedure: COLONOSCOPY WITH PROPOFOL;  Surgeon: Lesly Rubenstein, MD;  Location: [facility] ENDOSCOPY;  Service: Endoscopy;  Laterality: N/A;   COLONOSCOPY WITH PROPOFOL N/A 11/20/2020   Procedure: COLONOSCOPY WITH PROPOFOL;  Surgeon: Lesly Rubenstein, MD;  Location: [facility] ENDOSCOPY;  Service: Endoscopy;  Laterality: N/A;  DM STAT CBC, BMP COVID POSITIVE 09/02/2020   CTR     ESOPHAGOGASTRODUODENOSCOPY (EGD)  WITH PROPOFOL N/A 10/10/2020   Procedure: ESOPHAGOGASTRODUODENOSCOPY (EGD) WITH PROPOFOL;  Surgeon: Lesly Rubenstein, MD;  Location: [facility] ENDOSCOPY;  Service: Endoscopy;  Laterality: N/A;  COVID POSITIVE 10/08/2020   JOINT REPLACEMENT     KNEE SURGERY Right 09/24/2017   plates and pins   TOTAL SHOULDER ARTHROPLASTY Right 09/27/2015    PMH:  Past Medical History:  Diagnosis Date   Anxiety    Arthritis    Cirrhosis of liver ([place])    Diabetes mellitus without complication ([facility])    Dyspnea    DOE   GERD (gastroesophageal reflux disease)    Hepatitis    PAST   Hypertension    Neuropathy    Neuropathy, diabetic ([facility])    Pneumonia    Sinus complaint    Sleep apnea    CPAP   Thalassemia minor 1992    Brief Physical Examination: Vitals:   05/31/21 1317  BP: 118/67  Pulse: 81  SpO2: 100%   General: WD, WN female in NAD HEENT: Normocephalic, atraumatic Lungs: Respirations non-labored Abdomen: Enlarged    Michaelle Birks, MD [facility] Radiology   Pager. [place]

## 2021-05-31 NOTE — Procedures (Signed)
Vascular and Interventional Radiology Procedure Note  Patient: Rachel Huber DOB: [birth year] Medical Record Number: [id number] Note Date/Time: 05/31/21 1:47 PM   Performing Physician: Michaelle Birks, MD Assistant(s): None  Diagnosis:  Refractory ascites  Procedure: PARACENTESIS, DIAGNOSTIC AND THERAPEUTIC  Anesthesia: Local Anesthetic Complications: None Estimated Blood Loss:  0 mL Specimens:  Sent, as requested by Primary and GI Teams  Findings:  The Right Upper quadrant peritoneal space was accessed with a 20F Pigtail catheter Intravenous Albumin: Was administered.  See detailed procedure note with images in PACS. The patient tolerated the procedure well without incident or complication and was returned to Recovery in stable condition.    Michaelle Birks, MD Vascular and Interventional Radiology Specialists [facility]   Pager. [place]

## 2021-06-02 LAB — PROTEIN, BODY FLUID (OTHER): Total Protein, Body Fluid Other: 2.3 g/dL

## 2021-06-04 LAB — CYTOLOGY - NON PAP

## 2021-06-05 LAB — AEROBIC/ANAEROBIC CULTURE W GRAM STAIN (SURGICAL/DEEP WOUND)
Culture: NO GROWTH
Gram Stain: NONE SEEN

## 2021-06-11 ENCOUNTER — Encounter (HOSPITAL_COMMUNITY): Payer: 59 | Admitting: Physical Therapy

## 2021-06-13 ENCOUNTER — Other Ambulatory Visit: Payer: Self-pay | Admitting: Gastroenterology

## 2021-06-13 ENCOUNTER — Encounter (HOSPITAL_COMMUNITY): Payer: 59 | Admitting: Physical Therapy

## 2021-06-13 DIAGNOSIS — K7460 Unspecified cirrhosis of liver: Secondary | ICD-10-CM

## 2021-06-13 DIAGNOSIS — R188 Other ascites: Secondary | ICD-10-CM

## 2021-06-14 ENCOUNTER — Ambulatory Visit: Payer: 59

## 2021-06-17 ENCOUNTER — Ambulatory Visit
Admission: RE | Admit: 2021-06-17 | Discharge: 2021-06-17 | Disposition: A | Discharge status: Home / Self Care | Payer: 59 | Source: Ambulatory Visit | Attending: Gastroenterology | Admitting: Gastroenterology

## 2021-06-17 ENCOUNTER — Other Ambulatory Visit: Payer: Self-pay

## 2021-06-17 ENCOUNTER — Encounter (HOSPITAL_COMMUNITY): Payer: 59 | Admitting: Physical Therapy

## 2021-06-17 DIAGNOSIS — K7460 Unspecified cirrhosis of liver: Secondary | ICD-10-CM | POA: Diagnosis present

## 2021-06-17 DIAGNOSIS — R188 Other ascites: Secondary | ICD-10-CM | POA: Insufficient documentation

## 2021-06-17 LAB — BODY FLUID CELL COUNT WITH DIFFERENTIAL
Eos, Fluid: 0 %
Lymphs, Fluid: 36 %
Monocyte-Macrophage-Serous Fluid: 46 %
Neutrophil Count, Fluid: 18 %
Other Cells, Fluid: 0 %
Total Nucleated Cell Count, Fluid: 314 cu mm

## 2021-06-17 LAB — PROTEIN, PLEURAL OR PERITONEAL FLUID: Total protein, fluid: <3 g/dL

## 2021-06-17 LAB — ALBUMIN, PLEURAL OR PERITONEAL FLUID: Albumin, Fluid: 1.5 g/dL

## 2021-06-17 MED ORDER — ALBUMIN HUMAN 25 % IV SOLN: 25.0000 g | Freq: Once | INTRAVENOUS | Status: AC

## 2021-06-17 MED ORDER — ALBUMIN HUMAN 25 % IV SOLN
INTRAVENOUS | Status: AC
  Administered 2021-06-17: 25 g via INTRAVENOUS
  Filled 2021-06-17: qty 100

## 2021-06-17 NOTE — Procedures (Signed)
PROCEDURE SUMMARY:  Successful US guided paracentesis from RUQ.  Yielded 10.6 Liters of yellow colored fluid.  No immediate complications.  Pt tolerated well.   Specimen was sent for labs.  EBL < 45m  MHedy JacobPA-C 06/17/2021 4:02 PM

## 2021-06-18 LAB — PROTEIN, BODY FLUID (OTHER): Total Protein, Body Fluid Other: 2.4 g/dL

## 2021-06-19 LAB — CYTOLOGY - NON PAP

## 2021-06-20 ENCOUNTER — Encounter (HOSPITAL_COMMUNITY): Payer: 59 | Admitting: Physical Therapy

## 2021-06-21 LAB — BODY FLUID CULTURE W GRAM STAIN: Culture: NO GROWTH

## 2021-06-24 ENCOUNTER — Other Ambulatory Visit: Payer: Self-pay

## 2021-06-24 ENCOUNTER — Inpatient Hospital Stay (HOSPITAL_COMMUNITY): Payer: 59 | Attending: Hematology

## 2021-06-24 DIAGNOSIS — D500 Iron deficiency anemia secondary to blood loss (chronic): Secondary | ICD-10-CM | POA: Insufficient documentation

## 2021-06-24 DIAGNOSIS — D563 Thalassemia minor: Secondary | ICD-10-CM

## 2021-06-24 LAB — CBC WITH DIFFERENTIAL/PLATELET
Abs Immature Granulocytes: 0.02 10*3/uL (ref 0.00–0.07)
Basophils Absolute: 0 10*3/uL (ref 0.0–0.1)
Basophils Relative: 0 %
Eosinophils Absolute: 0.1 10*3/uL (ref 0.0–0.5)
Eosinophils Relative: 3 %
HCT: 24.2 % — ABNORMAL LOW (ref 36.0–46.0)
Hemoglobin: 7.2 g/dL — ABNORMAL LOW (ref 12.0–15.0)
Immature Granulocytes: 1 %
Lymphocytes Relative: 19 %
Lymphs Abs: 0.8 10*3/uL (ref 0.7–4.0)
MCH: 18.2 pg — ABNORMAL LOW (ref 26.0–34.0)
MCHC: 29.8 g/dL — ABNORMAL LOW (ref 30.0–36.0)
MCV: 61.1 fL — ABNORMAL LOW (ref 80.0–100.0)
Monocytes Absolute: 0.3 10*3/uL (ref 0.1–1.0)
Monocytes Relative: 7 %
Neutro Abs: 2.9 10*3/uL (ref 1.7–7.7)
Neutrophils Relative %: 70 %
Platelets: 193 10*3/uL (ref 150–400)
RBC: 3.96 MIL/uL (ref 3.87–5.11)
RDW: 17.7 % — ABNORMAL HIGH (ref 11.5–15.5)
WBC: 4.1 10*3/uL (ref 4.0–10.5)
nRBC: 0 % (ref 0.0–0.2)

## 2021-06-24 LAB — FERRITIN: Ferritin: 64 ng/mL (ref 11–307)

## 2021-06-24 LAB — FOLATE: Folate: 21.5 ng/mL (ref >5.9)

## 2021-06-24 LAB — IRON AND TIBC
Iron: 84 ug/dL (ref 28–170)
Saturation Ratios: 29 % (ref 10.4–31.8)
TIBC: 293 ug/dL (ref 250–450)
UIBC: 209 ug/dL

## 2021-06-24 LAB — VITAMIN B12: Vitamin B-12: 457 pg/mL (ref 180–914)

## 2021-06-24 LAB — VITAMIN D 25 HYDROXY (VIT D DEFICIENCY, FRACTURES): Vit D, 25-Hydroxy: 19.97 ng/mL — ABNORMAL LOW (ref 30–100)

## 2021-06-26 LAB — COPPER, SERUM: Copper: 118 ug/dL (ref 80–158)

## 2021-06-26 LAB — PROTEIN ELECTROPHORESIS, SERUM
A/G Ratio: 1.2 (ref 0.7–1.7)
Albumin ELP: 3.1 g/dL (ref 2.9–4.4)
Alpha-1-Globulin: 0.3 g/dL (ref 0.0–0.4)
Alpha-2-Globulin: 0.5 g/dL (ref 0.4–1.0)
Beta Globulin: 0.9 g/dL (ref 0.7–1.3)
Gamma Globulin: 0.8 g/dL (ref 0.4–1.8)
Globulin, Total: 2.6 g/dL (ref 2.2–3.9)
Total Protein ELP: 5.7 g/dL — ABNORMAL LOW (ref 6.0–8.5)

## 2021-06-27 ENCOUNTER — Other Ambulatory Visit: Payer: Self-pay | Admitting: Gastroenterology

## 2021-06-27 ENCOUNTER — Other Ambulatory Visit: Payer: Self-pay

## 2021-06-27 ENCOUNTER — Inpatient Hospital Stay (HOSPITAL_BASED_OUTPATIENT_CLINIC_OR_DEPARTMENT_OTHER): Payer: 59 | Admitting: Physician Assistant

## 2021-06-27 DIAGNOSIS — D500 Iron deficiency anemia secondary to blood loss (chronic): Secondary | ICD-10-CM

## 2021-06-27 DIAGNOSIS — D563 Thalassemia minor: Secondary | ICD-10-CM | POA: Diagnosis not present

## 2021-06-27 DIAGNOSIS — K7460 Unspecified cirrhosis of liver: Secondary | ICD-10-CM

## 2021-06-27 DIAGNOSIS — Z8542 Personal history of malignant neoplasm of other parts of uterus: Secondary | ICD-10-CM | POA: Diagnosis not present

## 2021-06-27 DIAGNOSIS — K7290 Hepatic failure, unspecified without coma: Secondary | ICD-10-CM | POA: Diagnosis not present

## 2021-06-27 DIAGNOSIS — K7581 Nonalcoholic steatohepatitis (NASH): Secondary | ICD-10-CM

## 2021-06-27 DIAGNOSIS — K643 Fourth degree hemorrhoids: Secondary | ICD-10-CM

## 2021-06-27 LAB — METHYLMALONIC ACID, SERUM: Methylmalonic Acid, Quantitative: 496 nmol/L — ABNORMAL HIGH (ref 0–378)

## 2021-06-27 NOTE — Progress Notes (Signed)
Virtual Visit via Telephone Note [facility]  I connected with Rachel Huber  on 06/27/21  at  3:09 PM  by telephone and verified that I am speaking with the correct person using two identifiers.  Location: Patient: Home Provider: [facility]   I discussed the limitations, risks, security and privacy concerns of performing an evaluation and management service by telephone and the availability of in person appointments. I also discussed with the patient that there may be a patient responsible charge related to this service. The patient expressed understanding and agreed to proceed.   REASON FOR VISIT:  Follow-up for iron deficiency anemia and thalassemia   PRIOR THERAPY: Blood transfusions as needed   CURRENT THERAPY: IV iron infusions as needed (last given Venofer 300 mg x 3 from 02/22/2021 through 03/08/2021)   INTERVAL HISTORY:  [name] 65 y.o. female returns for routine follow-up of iron deficiency anemia secondary to chronic blood loss and malabsorption.  She was last seen by Tarri Abernethy PA-C on 04/24/2021.  She reports that her energy level is at baseline with some mild chronic fatigue, energy about 80%.  She has not noted any recent rectal bleeding or other signs of blood loss such as epistaxis, hematemesis, or melena.  She does have some episodes of lightheadedness after paracentesis, but otherwise she denies any symptoms of dizziness, syncope, palpitations, chest pain, or dyspnea on exertion.  She does not have any pica or restless legs.  No B symptoms such as fever, chills, night sweats, unintentional weight loss.  She reports 80% energy and 100% appetite.  She endorses that she is maintaining a stable weight at this time.    OBSERVATIONS/OBJECTIVE: Review of Systems  Constitutional:  Negative for chills, diaphoresis, fever, malaise/fatigue and weight loss.  Respiratory:  Negative for cough and shortness of breath.   Cardiovascular:   Negative for chest pain and palpitations.  Gastrointestinal:  Negative for abdominal pain, blood in stool, melena, nausea and vomiting.  Neurological:  Positive for dizziness (After paracentesis) and tingling (Neuropathy in feet). Negative for headaches.    PHYSICAL EXAM (per limitations of virtual telephone visit): The patient is alert and oriented x 3, exhibiting adequate mentation, good mood, and ability to speak in full sentences and execute sound judgement.   ASSESSMENT & PLAN: 1.  Iron deficiency anemia - Suspected cause of anemia is due to chronic blood loss and malabsorption, as well as thalassemia (see below) - Her hemoglobin has been as low as 4.6, and she has received blood transfusions in the past.  Last blood transfusion was on 10/12/2020. - In April 2022, patient had episode of severe rectal bleeding with Hgb 8.8 and MCV 59.5 - Colonoscopies in February 2022 & April 2022 showed grade III/IV nonbleeding internal hemorrhoids, polyps, and area of significantly congested mucosa - EGD in February 2022 & April 2022 showed portal hypertensive gastropathy and erythematous duodenopathy - Unable to tolerate oral iron supplement and failed to improved on oral iron supplementation - She does not take any NSAIDs or antiplatelet agents - Goal is to keep ferritin level greater than 50; goal Hgb is 9.0-10.0 due to patient's underlying thalassemia - Received IV Venofer in July 2022 due to critically low iron and symptomatic anemia (Venofer 300 mg x 3 from 02/22/2021 through 03/08/2021) - Most recent labs (06/24/2021): Hgb 7.2/MCV 61.1/MCH 18.2, ferritin 64, iron saturation 29% - Additional labs show normal vitamin B12, copper, folate.  SPEP unremarkable.  Methylmalonic acid is pending. -  Intermittent rectal bleeding, last episode several months ago;  no epistaxis or melena  - Chronic fatigue is at baseline  - PLAN: No indication for IV iron at this time.  We will plan on repeating CBC/iron panel in 3  months.  Thalassemia addressed below.    2.  Thalassemia, unknown type  - She reports that she has always been anemic, but was not diagnosed with thalassemia until around age 89; she unsure what type of thalassemia - Patient's father, sister, and brother all have thalassemia - Her hemoglobin has been as low as 4.6, and she has received  only 1 blood transfusion in the past related to her rectal bleeding.  She has not required regular transfusion for her thalassemia. 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 - Her highest hemoglobin is around 9-10, but her baseline hemoglobin usually runs from 7.0-8.0 - Most recent CBC (06/24/2021) shows Hgb 7.2/MCV 61.1 and MCH 18.2 - Despite low Hgb (7.2), she is asymptomatic from her anemia, since this is her usual baseline - PLAN:  We will check hemoglobin fractionation cascade.  Suspect thalassemia intermedia.  We will transfuse as needed if < 7.0 or severely symptomatic from anemia. - No indication for transfusion at this time - RTC in 1 month to discuss results of hemoglobinopathy testing and to discuss management plan for thalassemia.  3.  Vitamin D deficiency -Vitamin D deficiency noted at 19.97 (06/24/2021) - PLAN: Patient instructed to start taking vitamin D 1000 units daily.  We will recheck levels in 3-6 months.  4.  History of uterine cancer - Diagnosed in 2010, she has total hysterectomy and bilateral oophorectomy - She did not require any chemotherapy or radiation - She was treated in [place], [place] - Her last CT imaging of her abdomen and pelvis showed no evidence of cancer recurrence  5.  Decompensated non-alcoholic liver cirrhosis - Diagnosed in February 2022 - She has recurrent ascites from liver cirrhosis, requires intermittent paracentesis - EGD showed signs of portal hypertension - She is aware  that she should avoid NSAID and aspirin containing patients indefinitely - She follows with [facility] as well as [facility]    FOLLOW UP INSTRUCTIONS: Labs later this month (hemoglobin fractionation cascade) Office visit after labs to discuss plan for thalassemia    I discussed the assessment and treatment plan with the patient. The patient was provided an opportunity to ask questions and all were answered. The patient agreed with the plan and demonstrated an understanding of the instructions.   The patient was advised to call back or seek an in-person evaluation if the symptoms worsen or if the condition fails to improve as anticipated.  I provided 17 minutes of non-face-to-face time during this encounter.   Harriett Rush, PA-C 06/27/2021 3:28 PM

## 2021-06-27 NOTE — Progress Notes (Signed)
Methylmalonic acid is elevated, which indicates that she may have some mild vitamin B12 deficiency.  RN POOL: Please call patient and inform her of results.  She should start taking vitamin B12 (cyanocobalamin) 500 mcg daily.

## 2021-06-28 ENCOUNTER — Other Ambulatory Visit (HOSPITAL_COMMUNITY): Payer: Self-pay | Admitting: *Deleted

## 2021-06-28 DIAGNOSIS — E538 Deficiency of other specified B group vitamins: Secondary | ICD-10-CM

## 2021-06-28 MED ORDER — VITAMIN B-12 500 MCG PO TABS: 500.0000 ug | ORAL_TABLET | Freq: Every day | ORAL | 3 refills | Status: DC

## 2021-06-28 NOTE — Progress Notes (Signed)
Patient called.  Patient's husband made aware. Verbalized understanding.

## 2021-06-28 NOTE — Progress Notes (Signed)
Vitamin B 12 500 mcg sent to her preferred pharmacy.  Patient aware.

## 2021-07-03 ENCOUNTER — Other Ambulatory Visit: Payer: Self-pay

## 2021-07-03 ENCOUNTER — Ambulatory Visit
Admission: RE | Admit: 2021-07-03 | Discharge: 2021-07-03 | Disposition: A | Discharge status: Home / Self Care | Payer: 59 | Source: Ambulatory Visit | Attending: Gastroenterology | Admitting: Gastroenterology

## 2021-07-03 DIAGNOSIS — K7460 Unspecified cirrhosis of liver: Secondary | ICD-10-CM | POA: Diagnosis present

## 2021-07-03 DIAGNOSIS — K7581 Nonalcoholic steatohepatitis (NASH): Secondary | ICD-10-CM | POA: Diagnosis not present

## 2021-07-03 LAB — BODY FLUID CELL COUNT WITH DIFFERENTIAL
Eos, Fluid: 0 %
Lymphs, Fluid: 59 %
Monocyte-Macrophage-Serous Fluid: 33 %
Neutrophil Count, Fluid: 8 %
Other Cells, Fluid: 0 %
Total Nucleated Cell Count, Fluid: 87 cu mm

## 2021-07-03 LAB — PROTEIN, PLEURAL OR PERITONEAL FLUID: Total protein, fluid: <3 g/dL

## 2021-07-03 LAB — ALBUMIN, PLEURAL OR PERITONEAL FLUID: Albumin, Fluid: 1.5 g/dL

## 2021-07-03 MED ORDER — ALBUMIN HUMAN 25 % IV SOLN
INTRAVENOUS | Status: AC
  Administered 2021-07-03: 25 g
  Filled 2021-07-03: qty 100

## 2021-07-03 MED ORDER — ALBUMIN HUMAN 25 % IV SOLN: 12.5000 g | Freq: Once | INTRAVENOUS | Status: AC

## 2021-07-03 MED ORDER — ALBUMIN HUMAN 25 % IV SOLN
INTRAVENOUS | Status: AC
  Administered 2021-07-03: 12.5 g via INTRAVENOUS
  Filled 2021-07-03: qty 100

## 2021-07-03 MED ORDER — ALBUMIN HUMAN 25 % IV SOLN: 25.0000 g | Freq: Once | INTRAVENOUS | Status: DC

## 2021-07-03 NOTE — Procedures (Signed)
PROCEDURE SUMMARY:  Successful US guided paracentesis from RUQ.  Yielded 11.6 L of clear yellow fluid.  No immediate complications.  Pt tolerated well.   Specimen was sent for labs.  EBL < 82m  MRockney Ghee11/16/2022 4:31 PM

## 2021-07-05 LAB — PROTEIN, BODY FLUID (OTHER): Total Protein, Body Fluid Other: 2.2 g/dL

## 2021-07-07 LAB — BODY FLUID CULTURE W GRAM STAIN
Culture: NO GROWTH
Gram Stain: NONE SEEN

## 2021-07-08 LAB — CYTOLOGY - NON PAP

## 2021-07-22 ENCOUNTER — Other Ambulatory Visit: Payer: Self-pay

## 2021-07-22 ENCOUNTER — Inpatient Hospital Stay (HOSPITAL_COMMUNITY): Payer: 59 | Attending: Hematology

## 2021-07-22 DIAGNOSIS — Z8542 Personal history of malignant neoplasm of other parts of uterus: Secondary | ICD-10-CM | POA: Insufficient documentation

## 2021-07-22 DIAGNOSIS — K7460 Unspecified cirrhosis of liver: Secondary | ICD-10-CM | POA: Diagnosis not present

## 2021-07-22 DIAGNOSIS — E119 Type 2 diabetes mellitus without complications: Secondary | ICD-10-CM | POA: Diagnosis not present

## 2021-07-22 DIAGNOSIS — E559 Vitamin D deficiency, unspecified: Secondary | ICD-10-CM | POA: Diagnosis not present

## 2021-07-22 DIAGNOSIS — K909 Intestinal malabsorption, unspecified: Secondary | ICD-10-CM | POA: Diagnosis not present

## 2021-07-22 DIAGNOSIS — Z90710 Acquired absence of both cervix and uterus: Secondary | ICD-10-CM | POA: Insufficient documentation

## 2021-07-22 DIAGNOSIS — Z8051 Family history of malignant neoplasm of kidney: Secondary | ICD-10-CM | POA: Diagnosis not present

## 2021-07-22 DIAGNOSIS — Z807 Family history of other malignant neoplasms of lymphoid, hematopoietic and related tissues: Secondary | ICD-10-CM | POA: Diagnosis not present

## 2021-07-22 DIAGNOSIS — D563 Thalassemia minor: Secondary | ICD-10-CM | POA: Diagnosis not present

## 2021-07-22 DIAGNOSIS — Z803 Family history of malignant neoplasm of breast: Secondary | ICD-10-CM | POA: Insufficient documentation

## 2021-07-22 DIAGNOSIS — D500 Iron deficiency anemia secondary to blood loss (chronic): Secondary | ICD-10-CM | POA: Diagnosis present

## 2021-07-22 DIAGNOSIS — I10 Essential (primary) hypertension: Secondary | ICD-10-CM | POA: Insufficient documentation

## 2021-07-22 LAB — CBC
HCT: 24.7 % — ABNORMAL LOW (ref 36.0–46.0)
Hemoglobin: 7.5 g/dL — ABNORMAL LOW (ref 12.0–15.0)
MCH: 19.4 pg — ABNORMAL LOW (ref 26.0–34.0)
MCHC: 30.4 g/dL (ref 30.0–36.0)
MCV: 63.8 fL — ABNORMAL LOW (ref 80.0–100.0)
Platelets: 198 10*3/uL (ref 150–400)
RBC: 3.87 MIL/uL (ref 3.87–5.11)
RDW: 17.6 % — ABNORMAL HIGH (ref 11.5–15.5)
WBC: 4.5 10*3/uL (ref 4.0–10.5)
nRBC: 0 % (ref 0.0–0.2)

## 2021-07-23 ENCOUNTER — Other Ambulatory Visit: Payer: Self-pay | Admitting: Gastroenterology

## 2021-07-23 DIAGNOSIS — R188 Other ascites: Secondary | ICD-10-CM

## 2021-07-23 DIAGNOSIS — K7460 Unspecified cirrhosis of liver: Secondary | ICD-10-CM

## 2021-07-24 LAB — HGB FRACTIONATION CASCADE
Hgb A2: 4.5 % — ABNORMAL HIGH (ref 1.8–3.2)
Hgb A: 94.5 % — ABNORMAL LOW (ref 96.4–98.8)
Hgb F: 1 % (ref 0.0–2.0)
Hgb S: 0 %

## 2021-07-25 ENCOUNTER — Other Ambulatory Visit: Payer: Self-pay

## 2021-07-25 ENCOUNTER — Ambulatory Visit
Admission: RE | Admit: 2021-07-25 | Discharge: 2021-07-25 | Disposition: A | Discharge status: Home / Self Care | Payer: 59 | Source: Ambulatory Visit | Attending: Gastroenterology | Admitting: Gastroenterology

## 2021-07-25 DIAGNOSIS — R188 Other ascites: Secondary | ICD-10-CM | POA: Diagnosis present

## 2021-07-25 DIAGNOSIS — K7460 Unspecified cirrhosis of liver: Secondary | ICD-10-CM | POA: Diagnosis present

## 2021-07-25 LAB — BODY FLUID CELL COUNT WITH DIFFERENTIAL
Eos, Fluid: 0 %
Lymphs, Fluid: 35 %
Monocyte-Macrophage-Serous Fluid: 58 %
Neutrophil Count, Fluid: 7 %
Other Cells, Fluid: 0 %
Total Nucleated Cell Count, Fluid: 105 cu mm

## 2021-07-25 LAB — ALBUMIN, PLEURAL OR PERITONEAL FLUID: Albumin, Fluid: 1.5 g/dL

## 2021-07-25 MED ORDER — ALBUMIN HUMAN 25 % IV SOLN
INTRAVENOUS | Status: AC
  Filled 2021-07-25: qty 100

## 2021-07-25 MED ORDER — ALBUMIN HUMAN 25 % IV SOLN: 25.0000 g | Freq: Once | INTRAVENOUS | Status: AC

## 2021-07-25 MED ORDER — ALBUMIN HUMAN 25 % IV SOLN: 25.0000 g | Freq: Once | INTRAVENOUS | Status: DC

## 2021-07-25 MED ORDER — ALBUMIN HUMAN 25 % IV SOLN
INTRAVENOUS | Status: AC
  Administered 2021-07-25: 25 g via INTRAVENOUS
  Filled 2021-07-25: qty 100

## 2021-07-25 NOTE — Procedures (Signed)
PROCEDURE SUMMARY:  Successful US guided paracentesis from RUQ.  Yielded 10.6 L of clear yellow fluid.  No immediate complications.  Pt tolerated well.   Specimen was sent for labs.  EBL < 96m  MRockney Ghee12/03/2021 4:15 PM

## 2021-07-29 LAB — CYTOLOGY - NON PAP

## 2021-07-30 LAB — AEROBIC/ANAEROBIC CULTURE W GRAM STAIN (SURGICAL/DEEP WOUND): Culture: NO GROWTH

## 2021-08-06 NOTE — Progress Notes (Signed)
Rachel Huber, [place] [address]   CLINIC:  Medical Oncology/Hematology  PCP:  [name], MD [place] [address] [phone]   REASON FOR VISIT:  Follow-up for iron deficiency anemia and thalassemia   PRIOR THERAPY: Blood transfusions as needed   CURRENT THERAPY: IV iron infusions as needed (last given Venofer 300 mg x 3 from 02/22/2021 through 03/08/2021)   INTERVAL HISTORY:  Rachel Huber 65 y.o. female returns for routine follow-up of iron deficiency anemia secondary to chronic blood loss and malabsorption.  She was last evaluated via telemedicine visit by Tarri Abernethy PA-C on 06/27/2021.  At today's visit, she reports feeling fair.  No recent hospitalizations, surgeries, or changes in baseline health status.  She continues to have intermittent rectal bleeding - reports some rectal bleeding this week, enough to "turn the toilet water dark red," but not enough that it is "gushing out." She has felt slightly more tired lately, with energy about 25%.  She denies any pica, restless legs, headaches, chest pain, dyspnea exertion, lightheadedness, or syncope.  Of note, she has markedly increased abdominal swelling secondary to her ascites/decompensated liver cirrhosis.  She reports that her last paracentesis was 2 weeks ago, and she has gained about 30 pounds since that time, presumably in fluid weight.  She has already spoken to the paracentesis clinic, and they are trying to get her in for paracentesis before the end of the week.  She is having some abdominal discomfort and increased leg swelling secondary to her ascites.  She has 25% energy and 100% appetite. She endorses that she is maintaining a stable weight.   REVIEW OF SYSTEMS:  Review of Systems  Constitutional:  Positive for fatigue. Negative for appetite change, chills, diaphoresis, fever and unexpected weight change.  HENT:    Negative for lump/mass and nosebleeds.   Eyes:  Negative for eye problems.  Respiratory:  Negative for cough, hemoptysis and shortness of breath.   Cardiovascular:  Positive for leg swelling. Negative for chest pain and palpitations.  Gastrointestinal:  Positive for abdominal pain and blood in stool. Negative for constipation, diarrhea, nausea and vomiting.  Genitourinary:  Negative for hematuria.   Skin: Negative.   Neurological:  Negative for dizziness, headaches and light-headedness.  Hematological:  Does not bruise/bleed easily.     PAST MEDICAL/SURGICAL HISTORY:  Past Medical History:  Diagnosis Date   Anxiety    Arthritis    Cirrhosis of liver ([place])    Diabetes mellitus without complication (HCC)    Dyspnea    DOE   GERD (gastroesophageal reflux disease)    Hepatitis    PAST   Hypertension    Neuropathy    Neuropathy, diabetic ([place])    Pneumonia    Sinus complaint    Sleep apnea    CPAP   Thalassemia minor 1992   Past Surgical History:  Procedure Laterality Date   ABDOMINAL HYSTERECTOMY     BREAST BIOPSY Right 02/15/2018   [place] bx 6-6:30 ribbon shape, ONE CORE FRAGMENT WITH FIBROSIS. ONE CORE FRAGMENT WITH PORTION OF A DILATED   BREAST BIOPSY Right 02/15/2018   [place] bx 9:00 heart shape, USUAL DUCTAL HYPERPLASIA   BREAST LUMPECTOMY Right 03/09/2018   Procedure: BREAST LUMPECTOMY x 2;  Surgeon: Benjamine Sprague, DO;  Location: [facility] ORS;  Service: General;  Laterality: Right;   CATARACT EXTRACTION W/PHACO Left 06/11/2016   Procedure: CATARACT EXTRACTION PHACO AND INTRAOCULAR LENS  PLACEMENT (IOC);  Surgeon: Estill Cotta, MD;  Location: [facility] ORS;  Service: Ophthalmology;  Laterality: Left;  Lot # [id number] H US:01:38.6 AP%:26.4 CDE:44.15   CATARACT EXTRACTION W/PHACO Right 06/15/2018   Procedure: CATARACT EXTRACTION PHACO AND INTRAOCULAR LENS PLACEMENT (IOC);  Surgeon: Birder Robson, MD;  Location: [facility] ORS;  Service: Ophthalmology;  Laterality: Right;  [place] 00:38.2 CDE  4.23 Fluid Pack Lot # [id number]   COLONOSCOPIES     COLONOSCOPY WITH PROPOFOL N/A 10/10/2020   Procedure: COLONOSCOPY WITH PROPOFOL;  Surgeon: Lesly Rubenstein, MD;  Location: [facility] ENDOSCOPY;  Service: Endoscopy;  Laterality: N/A;   COLONOSCOPY WITH PROPOFOL N/A 11/20/2020   Procedure: COLONOSCOPY WITH PROPOFOL;  Surgeon: Lesly Rubenstein, MD;  Location: [facility] ENDOSCOPY;  Service: Endoscopy;  Laterality: N/A;  DM STAT CBC, BMP COVID POSITIVE 09/02/2020   CTR     ESOPHAGOGASTRODUODENOSCOPY (EGD) WITH PROPOFOL N/A 10/10/2020   Procedure: ESOPHAGOGASTRODUODENOSCOPY (EGD) WITH PROPOFOL;  Surgeon: Lesly Rubenstein, MD;  Location: [facility] ENDOSCOPY;  Service: Endoscopy;  Laterality: N/A;  COVID POSITIVE 10/08/2020   JOINT REPLACEMENT     KNEE SURGERY Right 09/24/2017   plates and pins   TOTAL SHOULDER ARTHROPLASTY Right 09/27/2015     SOCIAL HISTORY:  Social History   Socioeconomic History   Marital status: Married    Spouse name: Not on file   Number of children: 0   Years of education: Not on file   Highest education level: Not on file  Occupational History   Occupation: EMPLOYED  Tobacco Use   Smoking status: Never   Smokeless tobacco: Never  Vaping Use   Vaping Use: Never used  Substance and Sexual Activity   Alcohol use: No   Drug use: Never   Sexual activity: Not Currently  Other Topics Concern   Not on file  Social History Narrative   Not on file   Social Determinants of Health   Financial Resource Strain: Low Risk    Difficulty of Paying Living Expenses: Not hard at all  Food Insecurity: No Food Insecurity   Worried About Charity fundraiser in the Last Year: Never true   [place] in the Last Year: Never true  Transportation Needs: No Transportation Needs   Lack of Transportation (Medical): No   Lack of Transportation (Non-Medical): No  Physical Activity: Inactive   Days of Exercise per Week: 0 days   Minutes of Exercise per Session: 0 min  Stress: No  Stress Concern Present   Feeling of Stress : Not at all  Social Connections: Moderately Isolated   Frequency of Communication with Friends and Family: More than three times a week   Frequency of Social Gatherings with Friends and Family: Once a week   Attends Religious Services: Never   Marine scientist or Organizations: No   Attends Music therapist: Never   Marital Status: Married  Human resources officer Violence: Not At Risk   Fear of Current or Ex-Partner: No   Emotionally Abused: No   Physically Abused: No   Sexually Abused: No    FAMILY HISTORY:  Family History  Problem Relation Age of Onset   Breast cancer Mother 66   Lymphoma Mother    Diabetes Father    Kidney cancer Father    Heart disease Father    Diabetes Sister    Breast cancer Sister 64    CURRENT MEDICATIONS:  Outpatient Encounter Medications as of 08/07/2021  Medication Sig   acetaminophen (TYLENOL) 650 MG CR  tablet Take 1,300 mg by mouth daily as needed for pain. (Patient not taking: Reported on 06/27/2021)   atorvastatin (LIPITOR) 10 MG tablet Take 10 mg by mouth daily.   ciprofloxacin (CIPRO) 500 MG tablet Take 500 mg by mouth.   diphenhydrAMINE (BENADRYL) 25 MG tablet Take 25 mg by mouth at bedtime as needed.   furosemide (LASIX) 20 MG tablet Take by mouth.   gabapentin (NEURONTIN) 400 MG capsule Take 800 mg by mouth daily at 2 am.   guaiFENesin-dextromethorphan (ROBITUSSIN DM) 100-10 MG/5ML syrup Take 5 mLs by mouth every 4 (four) hours as needed for cough. (Patient not taking: Reported on 06/27/2021)   melatonin 5 MG TABS Take 5 mg by mouth at bedtime as needed (sleep). (Patient not taking: Reported on 06/27/2021)   methocarbamol (ROBAXIN) 500 MG tablet Take 1 tablet (500 mg total) by mouth every 8 (eight) hours as needed for muscle spasms. (Patient not taking: Reported on 06/27/2021)   Multiple Vitamins-Minerals (MULTIVITAMIN WITH MINERALS) tablet Take 1 tablet by mouth daily.    ondansetron (ZOFRAN-ODT) 4 MG disintegrating tablet Take by mouth.   oxyCODONE-acetaminophen (PERCOCET/ROXICET) 5-325 MG tablet Take 1 tablet by mouth every 6 (six) hours as needed for severe pain. (Patient not taking: Reported on 06/27/2021)   oxymetazoline (AFRIN) 0.05 % nasal spray Place 1 spray into both nostrils 2 (two) times daily as needed.   sertraline (ZOLOFT) 50 MG tablet Take 50 mg by mouth daily.   spironolactone (ALDACTONE) 100 MG tablet Take by mouth.   TRULICITY 4.5 AS/5.0NL SOPN Inject 4.5 mg into the skin every Wednesday.   vitamin B-12 (CYANOCOBALAMIN) 500 MCG tablet Take 1 tablet (500 mcg total) by mouth daily.   No facility-administered encounter medications on file as of 08/07/2021.    ALLERGIES:  Allergies  Allergen Reactions   Latex Rash    Contact rash     PHYSICAL EXAM:  ECOG PERFORMANCE STATUS: 2 - Symptomatic, <50% confined to bed  There were no vitals filed for this visit. There were no vitals filed for this visit. Physical Exam Constitutional:      Appearance: Normal appearance. She is obese.  HENT:     Head: Normocephalic and atraumatic.     Mouth/Throat:     Mouth: Mucous membranes are moist.  Eyes:     Extraocular Movements: Extraocular movements intact.     Pupils: Pupils are equal, round, and reactive to light.  Cardiovascular:     Rate and Rhythm: Normal rate and regular rhythm.     Pulses: Normal pulses.     Heart sounds: Normal heart sounds.  Pulmonary:     Effort: Pulmonary effort is normal.     Breath sounds: Normal breath sounds.  Abdominal:     General: Bowel sounds are normal. There is distension.     Palpations: Abdomen is soft.     Tenderness: There is no abdominal tenderness.     Comments: Markedly tight and distended abdomen  Musculoskeletal:        General: No swelling.     Right lower leg: Edema (2+ pitting edema) present.     Left lower leg: Edema (2+ pitting edema) present.  Lymphadenopathy:     Cervical: No cervical  adenopathy.  Skin:    General: Skin is warm and dry.  Neurological:     General: No focal deficit present.     Mental Status: She is alert and oriented to person, place, and time.  Psychiatric:  Mood and Affect: Mood normal.        Behavior: Behavior normal.     LABORATORY DATA:  I have reviewed the labs as listed.  CBC    Component Value Date/Time   WBC 4.5 07/22/2021 1408   RBC 3.87 07/22/2021 1408   HGB 7.5 (L) 07/22/2021 1408   HCT 24.7 (L) 07/22/2021 1408   PLT 198 07/22/2021 1408   MCV 63.8 (L) 07/22/2021 1408   MCH 19.4 (L) 07/22/2021 1408   MCHC 30.4 07/22/2021 1408   RDW 17.6 (H) 07/22/2021 1408   LYMPHSABS 0.8 06/24/2021 1300   MONOABS 0.3 06/24/2021 1300   EOSABS 0.1 06/24/2021 1300   BASOSABS 0.0 06/24/2021 1300   CMP Latest Ref Rng & Units 11/20/2020 10/29/2020 10/27/2020  Glucose 70 - 99 mg/dL 153(H) 118(H) 78  BUN 8 - 23 mg/dL 27(H) 14 21  Creatinine 0.44 - 1.00 mg/dL 1.50(H) 0.92 0.92  Sodium 135 - 145 mmol/L 136 139 137  Potassium 3.5 - 5.1 mmol/L 4.4 3.6 3.4(L)  Chloride 98 - 111 mmol/L 100 106 107  CO2 22 - 32 mmol/L 27 26 22   Calcium 8.9 - 10.3 mg/dL 9.1 8.4(L) 8.5(L)  Total Protein 6.5 - 8.1 g/dL - 5.6(L) 5.5(L)  Total Bilirubin 0.3 - 1.2 mg/dL - 1.0 0.9  Alkaline Phos 38 - 126 U/L - 89 84  AST 15 - 41 U/L - 28 25  ALT 0 - 44 U/L - 20 20    DIAGNOSTIC IMAGING:  I have independently reviewed the relevant imaging and discussed with the patient.  ASSESSMENT & PLAN: 1.  Iron deficiency anemia - Suspected cause of anemia is due to chronic blood loss and malabsorption, as well as thalassemia (see below) - Her hemoglobin has been as low as 4.6, and she has received blood transfusions in the past.  Last blood transfusion was on 10/12/2020. - In April 2022, patient had episode of severe rectal bleeding with Hgb 8.8 and MCV 59.5 - Colonoscopies in February 2022 & April 2022 showed grade III/IV nonbleeding internal hemorrhoids, polyps, and area of  significantly congested mucosa - EGD in February 2022 & April 2022 showed portal hypertensive gastropathy and erythematous duodenopathy - Unable to tolerate oral iron supplement, failed to improved on oral iron supplementation - She does not take any NSAIDs or antiplatelet agents - Received IV Venofer in July 2022 due to critically low iron and symptomatic anemia (Venofer 300 mg x 3 from 02/22/2021 through 03/08/2021) - Most recent labs (06/24/2021): Hgb 7.2/MCV 61.1/MCH 18.2, ferritin 64, iron saturation 29% - Additional labs show normal vitamin B12, copper, folate.  SPEP unremarkable.  - Intermittent rectal bleeding, last episode earlier this week - Chronic fatigue is at baseline  - Goal is to keep ferritin level 100-200 (due to chronic GI blood loss and comorbid CKD); goal Hgb is 9.0-10.0 due to patient's underlying thalassemia - PLAN: Recommend IV Feraheme x2. - Repeat labs and RTC in 2 months.   2.  Beta thalassemia minor - She reports that she has always been anemic, but was not diagnosed with thalassemia until around age 63.  Her father, sister, and brother all have thalassemia. - Her hemoglobin has been as low as 4.6, and she has received  only 1 blood transfusion in the past related to her rectal bleeding.  She has not required regular transfusion for her thalassemia. 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  We will transfuse as needed if < 7.0 or severely symptomatic from anemia. - Will consider additional    3.  Vitamin D deficiency -Vitamin D deficiency noted at 19.97 (06/24/2021) - PLAN: Patient instructed  to start taking vitamin D 1000 units daily.  We will recheck levels in 3-6 months.   4.  History of uterine cancer - Diagnosed in 2010, she has total hysterectomy and bilateral oophorectomy - She did not require any chemotherapy or radiation - She was treated in [place], [place] - Her last CT imaging of her abdomen and pelvis showed no evidence of cancer recurrence   5.  Decompensated non-alcoholic liver cirrhosis - Diagnosed in February 2022 - She has recurrent ascites from liver cirrhosis, requires intermittent paracentesis - EGD showed signs of portal hypertension - She is aware that she should avoid NSAID and aspirin containing patients indefinitely - She follows with [facility] as well as [facility] -*NOTE (08/07/2021): Patient has markedly distended abdomen and increased ascites on exam today, reports that she has gained about 30 pounds since her last paracentesis 2 weeks ago.  She has reached out to her GI providers and they are trying to get her scheduled for paracentesis later this week.   PLAN SUMMARY & DISPOSITION: IV Feraheme x2 Labs and RTC in 2 months  All questions were answered. The patient knows to call the clinic with any problems, questions or concerns.  Medical decision making: Moderate  Time spent on visit: I spent 20 minutes counseling the patient face to face. The total time spent in the appointment was 30 minutes and more than 50% was on counseling.   Harriett Rush, PA-C  08/07/2021 11:01 PM

## 2021-08-07 ENCOUNTER — Other Ambulatory Visit: Payer: Self-pay

## 2021-08-07 ENCOUNTER — Other Ambulatory Visit: Payer: Self-pay | Admitting: Gastroenterology

## 2021-08-07 ENCOUNTER — Inpatient Hospital Stay (HOSPITAL_BASED_OUTPATIENT_CLINIC_OR_DEPARTMENT_OTHER): Payer: 59 | Admitting: Physician Assistant

## 2021-08-07 VITALS — BP 109/47 | HR 83 | Temp 97.3°F | Resp 18

## 2021-08-07 DIAGNOSIS — E538 Deficiency of other specified B group vitamins: Secondary | ICD-10-CM

## 2021-08-07 DIAGNOSIS — D500 Iron deficiency anemia secondary to blood loss (chronic): Secondary | ICD-10-CM

## 2021-08-07 DIAGNOSIS — R188 Other ascites: Secondary | ICD-10-CM

## 2021-08-07 DIAGNOSIS — D563 Thalassemia minor: Secondary | ICD-10-CM | POA: Diagnosis not present

## 2021-08-07 NOTE — Patient Instructions (Signed)
Rachel Huber at [facility] Discharge Instructions  You were seen today by Tarri Abernethy PA-C for your iron deficiency anemia and beta thalassemia minor.  We will schedule you for IV iron infusion x2 and we will check your labs and see you back for another visit in 8 weeks.   Thank you for choosing [place] at [facility] to provide your oncology and hematology care.  To afford each patient quality time with our provider, please arrive at least 15 minutes before your scheduled appointment time.   If you have a lab appointment with the [facility] please come in thru the Main Entrance and check in at the main information desk.  You need to re-schedule your appointment should you arrive 10 or more minutes late.  We strive to give you quality time with our providers, and arriving late affects you and other patients whose appointments are after yours.  Also, if you no show three or more times for appointments you may be dismissed from the clinic at the providers discretion.     Again, thank you for choosing [facility].  Our hope is that these requests will decrease the amount of time that you wait before being seen by our physicians.       _____________________________________________________________  Should you have questions after your visit to [facility], please contact our office at [phone] and follow the prompts.  Our office hours are 8:00 a.m. and 4:30 p.m. Monday - Friday.  Please note that voicemails left after 4:00 p.m. may not be returned until the following business day.  We are closed weekends and major holidays.  You do have access to a nurse 24-7, just call the main number to the clinic [phone] and do not press any options, hold on the line and a nurse will answer the phone.    For prescription refill requests, have your pharmacy contact our office and allow 72 hours.    Due to Covid, you will  need to wear a mask upon entering the hospital. If you do not have a mask, a mask will be given to you at the Main Entrance upon arrival. For doctor visits, patients may have 1 support person age 80 or older with them. For treatment visits, patients can not have anyone with them due to social distancing guidelines and our immunocompromised population.

## 2021-08-09 ENCOUNTER — Ambulatory Visit
Admission: RE | Admit: 2021-08-09 | Discharge: 2021-08-09 | Disposition: A | Discharge status: Home / Self Care | Payer: 59 | Source: Ambulatory Visit | Attending: Gastroenterology | Admitting: Gastroenterology

## 2021-08-09 ENCOUNTER — Other Ambulatory Visit: Payer: Self-pay

## 2021-08-09 DIAGNOSIS — R188 Other ascites: Secondary | ICD-10-CM | POA: Insufficient documentation

## 2021-08-09 LAB — ALBUMIN, PLEURAL OR PERITONEAL FLUID: Albumin, Fluid: <1.5 g/dL

## 2021-08-09 LAB — BODY FLUID CELL COUNT WITH DIFFERENTIAL
Eos, Fluid: 0 %
Lymphs, Fluid: 44 %
Monocyte-Macrophage-Serous Fluid: 48 %
Neutrophil Count, Fluid: 8 %
Total Nucleated Cell Count, Fluid: 74 cu mm

## 2021-08-09 LAB — PROTEIN, PLEURAL OR PERITONEAL FLUID: Total protein, fluid: <3 g/dL

## 2021-08-09 MED ORDER — ALBUMIN HUMAN 25 % IV SOLN
INTRAVENOUS | Status: AC
  Administered 2021-08-09: 15:00:00 25 g via INTRAVENOUS
  Filled 2021-08-09: qty 200

## 2021-08-09 MED ORDER — ALBUMIN HUMAN 25 % IV SOLN: 25.0000 g | Freq: Once | INTRAVENOUS | Status: AC

## 2021-08-09 MED ORDER — ALBUMIN HUMAN 25 % IV SOLN
25.0000 g | Freq: Once | INTRAVENOUS | Status: AC
  Administered 2021-08-09: 16:00:00 25 g via INTRAVENOUS

## 2021-08-09 NOTE — Procedures (Signed)
PROCEDURE SUMMARY:  Successful US guided paracentesis from RLQ.  Yielded 13.6L of clear yellow fluid.  No immediate complications.  Pt tolerated well.   Specimen was sent for labs.  EBL < 64mL  Rockney Ghee 08/09/2021 3:09 PM

## 2021-08-11 LAB — PROTEIN, BODY FLUID (OTHER): Total Protein, Body Fluid Other: 2 g/dL

## 2021-08-13 LAB — BODY FLUID CULTURE W GRAM STAIN: Culture: NO GROWTH

## 2021-08-14 LAB — CYTOLOGY - NON PAP

## 2021-08-15 ENCOUNTER — Encounter (HOSPITAL_COMMUNITY): Payer: Self-pay

## 2021-08-15 ENCOUNTER — Inpatient Hospital Stay (HOSPITAL_COMMUNITY): Payer: 59

## 2021-08-15 ENCOUNTER — Other Ambulatory Visit: Payer: Self-pay

## 2021-08-15 ENCOUNTER — Encounter (HOSPITAL_COMMUNITY): Payer: Self-pay | Admitting: Hematology

## 2021-08-15 VITALS — BP 116/56 | HR 91 | Temp 97.9°F | Resp 18

## 2021-08-15 DIAGNOSIS — D500 Iron deficiency anemia secondary to blood loss (chronic): Secondary | ICD-10-CM

## 2021-08-15 MED ORDER — LORATADINE 10 MG PO TABS
10.0000 mg | ORAL_TABLET | Freq: Once | ORAL | Status: AC
  Administered 2021-08-15: 15:00:00 10 mg via ORAL
  Filled 2021-08-15: qty 1

## 2021-08-15 MED ORDER — SODIUM CHLORIDE 0.9 % IV SOLN: Freq: Once | INTRAVENOUS | Status: AC

## 2021-08-15 MED ORDER — SODIUM CHLORIDE 0.9 % IV SOLN
510.0000 mg | Freq: Once | INTRAVENOUS | Status: AC
  Administered 2021-08-15: 15:00:00 510 mg via INTRAVENOUS
  Filled 2021-08-15: qty 17

## 2021-08-15 NOTE — Patient Instructions (Signed)
[facility]  Discharge Instructions: Thank you for choosing [facility] to provide your oncology and hematology care.  If you have a lab appointment with the [facility], please come in thru the Main Entrance and check in at the main information desk.  Wear comfortable clothing and clothing appropriate for easy access to any Portacath or PICC line.   We strive to give you quality time with your provider. You may need to reschedule your appointment if you arrive late (15 or more minutes).  Arriving late affects you and other patients whose appointments are after yours.  Also, if you miss three or more appointments without notifying the office, you may be dismissed from the clinic at the providers discretion.      For prescription refill requests, have your pharmacy contact our office and allow 72 hours for refills to be completed.    Today you received the following: Feraheme.       To help prevent nausea and vomiting after your treatment, we encourage you to take your nausea medication as directed.  BELOW ARE SYMPTOMS THAT SHOULD BE REPORTED IMMEDIATELY: *FEVER GREATER THAN 100.4 F (38 C) OR HIGHER *CHILLS OR SWEATING *NAUSEA AND VOMITING THAT IS NOT CONTROLLED WITH YOUR NAUSEA MEDICATION *UNUSUAL SHORTNESS OF BREATH *UNUSUAL BRUISING OR BLEEDING *URINARY PROBLEMS (pain or burning when urinating, or frequent urination) *BOWEL PROBLEMS (unusual diarrhea, constipation, pain near the anus) TENDERNESS IN MOUTH AND THROAT WITH OR WITHOUT PRESENCE OF ULCERS (sore throat, sores in mouth, or a toothache) UNUSUAL RASH, SWELLING OR PAIN  UNUSUAL VAGINAL DISCHARGE OR ITCHING   Items with * indicate a potential emergency and should be followed up as soon as possible or go to the Emergency Department if any problems should occur.  Please show the CHEMOTHERAPY ALERT CARD or IMMUNOTHERAPY ALERT CARD at check-in to the Emergency Department and triage nurse.  Should you  have questions after your visit or need to cancel or reschedule your appointment, please contact [facility] [phone]  and follow the prompts.  Office hours are 8:00 a.m. to 4:30 p.m. Monday - Friday. Please note that voicemails left after 4:00 p.m. may not be returned until the following business day.  We are closed weekends and major holidays. You have access to a nurse at all times for urgent questions. Please call the main number to the clinic [phone] and follow the prompts.  For any non-urgent questions, you may also contact your provider using MyChart. We now offer e-Visits for anyone 12 and older to request care online for non-urgent symptoms. For details visit [email or web address].   Also download the MyChart app! Go to the app store, search "MyChart", open the app, select [facility], and log in with your MyChart username and password.  Due to Covid, a mask is required upon entering the hospital/clinic. If you do not have a mask, one will be given to you upon arrival. For doctor visits, patients may have 1 support person aged 30 or older with them. For treatment visits, patients cannot have anyone with them due to current Covid guidelines and our immunocompromised population.

## 2021-08-15 NOTE — Progress Notes (Signed)
Ferahame given today per MD orders. Tolerated infusion without adverse affects. Vital signs stable. No complaints at this time. Discharged from clinic ambulatory in stable condition. Alert and oriented x 3. F/U with [facility] as scheduled.

## 2021-08-20 ENCOUNTER — Other Ambulatory Visit: Payer: Self-pay | Admitting: Gastroenterology

## 2021-08-20 ENCOUNTER — Encounter (HOSPITAL_COMMUNITY): Payer: Self-pay | Admitting: Hematology

## 2021-08-20 DIAGNOSIS — K7460 Unspecified cirrhosis of liver: Secondary | ICD-10-CM

## 2021-08-22 ENCOUNTER — Ambulatory Visit
Admission: RE | Admit: 2021-08-22 | Discharge: 2021-08-22 | Disposition: A | Discharge status: Home / Self Care | Payer: 59 | Source: Ambulatory Visit | Attending: Gastroenterology | Admitting: Gastroenterology

## 2021-08-22 ENCOUNTER — Other Ambulatory Visit: Payer: Self-pay

## 2021-08-22 ENCOUNTER — Ambulatory Visit (HOSPITAL_COMMUNITY): Payer: Self-pay

## 2021-08-22 DIAGNOSIS — K7581 Nonalcoholic steatohepatitis (NASH): Secondary | ICD-10-CM | POA: Diagnosis present

## 2021-08-22 DIAGNOSIS — R188 Other ascites: Secondary | ICD-10-CM | POA: Diagnosis not present

## 2021-08-22 DIAGNOSIS — K7460 Unspecified cirrhosis of liver: Secondary | ICD-10-CM | POA: Diagnosis present

## 2021-08-22 LAB — BODY FLUID CELL COUNT WITH DIFFERENTIAL
Eos, Fluid: 0 %
Lymphs, Fluid: 45 %
Monocyte-Macrophage-Serous Fluid: 49 %
Neutrophil Count, Fluid: 6 %
Total Nucleated Cell Count, Fluid: 74 cu mm

## 2021-08-22 LAB — ALBUMIN, PLEURAL OR PERITONEAL FLUID: Albumin, Fluid: <1.5 g/dL

## 2021-08-22 LAB — PROTEIN, PLEURAL OR PERITONEAL FLUID: Total protein, fluid: <3 g/dL

## 2021-08-22 LAB — PATHOLOGIST SMEAR REVIEW

## 2021-08-22 MED ORDER — ALBUMIN HUMAN 25 % IV SOLN: 25.0000 g | Freq: Once | INTRAVENOUS | Status: AC

## 2021-08-22 MED ORDER — ALBUMIN HUMAN 25 % IV SOLN
INTRAVENOUS | Status: AC
  Administered 2021-08-22: 25 g via INTRAVENOUS
  Filled 2021-08-22: qty 100

## 2021-08-22 MED ORDER — ALBUMIN HUMAN 25 % IV SOLN
25.0000 g | Freq: Once | INTRAVENOUS | Status: AC
Start: 2021-08-22 — End: 2021-08-22

## 2021-08-22 NOTE — Procedures (Signed)
PROCEDURE SUMMARY:  Successful US guided diagnostic and therapeutic paracentesis from RLQ. Yielded 11.5 L of hazy, yellow fluid.  No immediate complications.  Pt tolerated well.   Specimen was sent for labs.  EBL < 1 mL  Tyson Alias, AGNP 08/22/2021 2:08 PM

## 2021-08-22 NOTE — Progress Notes (Signed)
Intravenous Iron Formulation Change  Ms Eugene has insurance that requires a change in intravenous iron product from Feraheme to Venofer. Orders have been updated to reflect this change and scheduling message sent to adjust infusion appointments. Tarri Abernethy, PA-C notified and agrees with the plan.  Allergies:  Allergies  Allergen Reactions   Latex Rash    Contact rash    The plan for iron therapy is as follows: Venofer 300 mg IVPB weekly x 2 doses.  Wynona Neat 08/22/2021

## 2021-08-23 ENCOUNTER — Inpatient Hospital Stay (HOSPITAL_COMMUNITY): Payer: 59 | Attending: Hematology

## 2021-08-23 ENCOUNTER — Encounter (HOSPITAL_COMMUNITY): Payer: Self-pay

## 2021-08-23 VITALS — BP 113/46 | HR 90 | Temp 97.6°F | Resp 20

## 2021-08-23 DIAGNOSIS — D500 Iron deficiency anemia secondary to blood loss (chronic): Secondary | ICD-10-CM | POA: Diagnosis not present

## 2021-08-23 LAB — PROTEIN, BODY FLUID (OTHER): Total Protein, Body Fluid Other: 2 g/dL

## 2021-08-23 MED ORDER — SODIUM CHLORIDE 0.9 % IV SOLN
300.0000 mg | Freq: Once | INTRAVENOUS | Status: AC
  Administered 2021-08-23: 300 mg via INTRAVENOUS
  Filled 2021-08-23: qty 300

## 2021-08-23 MED ORDER — SODIUM CHLORIDE 0.9 % IV SOLN: Freq: Once | INTRAVENOUS | Status: AC

## 2021-08-23 MED ORDER — LORATADINE 10 MG PO TABS
10.0000 mg | ORAL_TABLET | Freq: Once | ORAL | Status: AC
  Administered 2021-08-23: 10 mg via ORAL
  Filled 2021-08-23: qty 1

## 2021-08-23 NOTE — Progress Notes (Signed)
Patient presents today for Venofer 300 mg IVPB weekly x 2 doses. 1st dose today. Patient's insurance required a change in iron infusion from Feraheme to Venofer. Vital signs stable. Patient has no complaints of any changes since her last visit.  Treatment given today per MD orders. Tolerated infusion without adverse affects. Vital signs stable. No complaints at this time. Discharged from clinic ambulatory in stable condition. Alert and oriented x 3. F/U with [facility] as scheduled.

## 2021-08-26 LAB — BODY FLUID CULTURE W GRAM STAIN
Culture: NO GROWTH
Gram Stain: NONE SEEN

## 2021-08-27 DIAGNOSIS — E1140 Type 2 diabetes mellitus with diabetic neuropathy, unspecified: Secondary | ICD-10-CM | POA: Diagnosis not present

## 2021-08-27 DIAGNOSIS — I10 Essential (primary) hypertension: Secondary | ICD-10-CM | POA: Diagnosis not present

## 2021-08-27 DIAGNOSIS — Z6841 Body Mass Index (BMI) 40.0 and over, adult: Secondary | ICD-10-CM | POA: Diagnosis not present

## 2021-08-29 ENCOUNTER — Other Ambulatory Visit: Payer: Self-pay | Admitting: Gastroenterology

## 2021-08-29 DIAGNOSIS — R188 Other ascites: Secondary | ICD-10-CM

## 2021-08-30 ENCOUNTER — Other Ambulatory Visit: Payer: Self-pay

## 2021-08-30 ENCOUNTER — Inpatient Hospital Stay (HOSPITAL_COMMUNITY): Payer: 59

## 2021-08-30 VITALS — BP 108/68 | HR 86 | Temp 97.5°F | Resp 18

## 2021-08-30 DIAGNOSIS — D500 Iron deficiency anemia secondary to blood loss (chronic): Secondary | ICD-10-CM

## 2021-08-30 MED ORDER — LORATADINE 10 MG PO TABS
10.0000 mg | ORAL_TABLET | Freq: Once | ORAL | Status: AC
  Administered 2021-08-30: 10 mg via ORAL
  Filled 2021-08-30: qty 1

## 2021-08-30 MED ORDER — SODIUM CHLORIDE 0.9 % IV SOLN: Freq: Once | INTRAVENOUS | Status: AC

## 2021-08-30 MED ORDER — SODIUM CHLORIDE 0.9 % IV SOLN
300.0000 mg | Freq: Once | INTRAVENOUS | Status: AC
  Administered 2021-08-30: 300 mg via INTRAVENOUS
  Filled 2021-08-30: qty 300

## 2021-08-30 NOTE — Progress Notes (Signed)
Patient presents today for Venofer infusion per providers order.  Vital signs WNL.  Patient has no new complaints at this time.  Peripheral IV started and blood return noted pre and post infusion.  Venofer infusion given today per MD orders.  Stable during infusion without adverse affects.  Vital signs stable.  No complaints at this time.  Discharge from clinic ambulatory in stable condition.  Alert and oriented X 3.  Follow up with [facility] as scheduled.  

## 2021-08-30 NOTE — Patient Instructions (Signed)
[facility]  Discharge Instructions: Thank you for choosing [facility] to provide your oncology and hematology care.  If you have a lab appointment with the Cancer Center, please come in thru the Main Entrance and check in at the main information desk.  Wear comfortable clothing and clothing appropriate for easy access to any Portacath or PICC line.   We strive to give you quality time with your provider. You may need to reschedule your appointment if you arrive late (15 or more minutes).  Arriving late affects you and other patients whose appointments are after yours.  Also, if you miss three or more appointments without notifying the office, you may be dismissed from the clinic at the provider's discretion.      For prescription refill requests, have your pharmacy contact our office and allow 72 hours for refills to be completed.    Today you received the following chemotherapy and/or immunotherapy agents Venofer      To help prevent nausea and vomiting after your treatment, we encourage you to take your nausea medication as directed.  BELOW ARE SYMPTOMS THAT SHOULD BE REPORTED IMMEDIATELY: *FEVER GREATER THAN 100.4 F (38 C) OR HIGHER *CHILLS OR SWEATING *NAUSEA AND VOMITING THAT IS NOT CONTROLLED WITH YOUR NAUSEA MEDICATION *UNUSUAL SHORTNESS OF BREATH *UNUSUAL BRUISING OR BLEEDING *URINARY PROBLEMS (pain or burning when urinating, or frequent urination) *BOWEL PROBLEMS (unusual diarrhea, constipation, pain near the anus) TENDERNESS IN MOUTH AND THROAT WITH OR WITHOUT PRESENCE OF ULCERS (sore throat, sores in mouth, or a toothache) UNUSUAL RASH, SWELLING OR PAIN  UNUSUAL VAGINAL DISCHARGE OR ITCHING   Items with * indicate a potential emergency and should be followed up as soon as possible or go to the Emergency Department if any problems should occur.  Please show the CHEMOTHERAPY ALERT CARD or IMMUNOTHERAPY ALERT CARD at check-in to the Emergency  Department and triage nurse.  Should you have questions after your visit or need to cancel or reschedule your appointment, please contact [facility] [phone]  and follow the prompts.  Office hours are 8:00 a.m. to 4:30 p.m. Monday - Friday. Please note that voicemails left after 4:00 p.m. may not be returned until the following business day.  We are closed weekends and major holidays. You have access to a nurse at all times for urgent questions. Please call the main number to the clinic [phone] and follow the prompts.  For any non-urgent questions, you may also contact your provider using MyChart. We now offer e-Visits for anyone 18 and older to request care online for non-urgent symptoms. For details visit [email or web address].   Also download the MyChart app! Go to the app store, search "MyChart", open the app, select [place], and log in with your MyChart username and password.  Due to Covid, a mask is required upon entering the hospital/clinic. If you do not have a mask, one will be given to you upon arrival. For doctor visits, patients may have 1 support person aged 18 or older with them. For treatment visits, patients cannot have anyone with them due to current Covid guidelines and our immunocompromised population.  

## 2021-09-02 ENCOUNTER — Ambulatory Visit
Admission: RE | Admit: 2021-09-02 | Discharge: 2021-09-02 | Disposition: A | Discharge status: Home / Self Care | Payer: 59 | Source: Ambulatory Visit | Attending: Gastroenterology | Admitting: Gastroenterology

## 2021-09-02 DIAGNOSIS — R188 Other ascites: Secondary | ICD-10-CM | POA: Diagnosis not present

## 2021-09-02 LAB — BODY FLUID CELL COUNT WITH DIFFERENTIAL
Eos, Fluid: 0 %
Lymphs, Fluid: 55 %
Monocyte-Macrophage-Serous Fluid: 35 %
Neutrophil Count, Fluid: 10 %
Other Cells, Fluid: 0 %
Total Nucleated Cell Count, Fluid: 85 cu mm

## 2021-09-02 LAB — ALBUMIN, PLEURAL OR PERITONEAL FLUID: Albumin, Fluid: <1.5 g/dL

## 2021-09-02 LAB — PROTEIN, PLEURAL OR PERITONEAL FLUID: Total protein, fluid: <3 g/dL

## 2021-09-02 MED ORDER — ALBUMIN HUMAN 25 % IV SOLN: 25.0000 g | Freq: Once | INTRAVENOUS | Status: AC

## 2021-09-02 MED ORDER — ALBUMIN HUMAN 25 % IV SOLN
INTRAVENOUS | Status: AC
  Administered 2021-09-02: 25 g via INTRAVENOUS
  Filled 2021-09-02: qty 100

## 2021-09-02 NOTE — Procedures (Signed)
PROCEDURE SUMMARY:  Successful US guided paracentesis from RLQ.  Yielded 12.4L of clear yellow fluid.  No immediate complications.  Pt tolerated well.   Specimen was not sent for labs.  EBL < 1 mL  Rockney Ghee 09/02/2021 4:46 PM

## 2021-09-03 DIAGNOSIS — E1140 Type 2 diabetes mellitus with diabetic neuropathy, unspecified: Secondary | ICD-10-CM | POA: Diagnosis not present

## 2021-09-03 DIAGNOSIS — J3489 Other specified disorders of nose and nasal sinuses: Secondary | ICD-10-CM | POA: Diagnosis not present

## 2021-09-03 DIAGNOSIS — G4733 Obstructive sleep apnea (adult) (pediatric): Secondary | ICD-10-CM | POA: Diagnosis not present

## 2021-09-03 DIAGNOSIS — I10 Essential (primary) hypertension: Secondary | ICD-10-CM | POA: Diagnosis not present

## 2021-09-03 LAB — PROTEIN, BODY FLUID (OTHER): Total Protein, Body Fluid Other: 1.8 g/dL

## 2021-09-04 LAB — CYTOLOGY - NON PAP

## 2021-09-07 LAB — BODY FLUID CULTURE W GRAM STAIN
Culture: NO GROWTH
Gram Stain: NONE SEEN

## 2021-09-11 ENCOUNTER — Other Ambulatory Visit: Payer: Self-pay | Admitting: Gastroenterology

## 2021-09-11 DIAGNOSIS — R188 Other ascites: Secondary | ICD-10-CM

## 2021-09-11 DIAGNOSIS — K7460 Unspecified cirrhosis of liver: Secondary | ICD-10-CM

## 2021-09-12 ENCOUNTER — Other Ambulatory Visit: Payer: Self-pay

## 2021-09-12 ENCOUNTER — Ambulatory Visit
Admission: RE | Admit: 2021-09-12 | Discharge: 2021-09-12 | Disposition: A | Discharge status: Home / Self Care | Payer: 59 | Source: Ambulatory Visit | Attending: Gastroenterology | Admitting: Gastroenterology

## 2021-09-12 DIAGNOSIS — K7460 Unspecified cirrhosis of liver: Secondary | ICD-10-CM | POA: Diagnosis present

## 2021-09-12 DIAGNOSIS — R188 Other ascites: Secondary | ICD-10-CM | POA: Diagnosis not present

## 2021-09-12 DIAGNOSIS — K7581 Nonalcoholic steatohepatitis (NASH): Secondary | ICD-10-CM | POA: Diagnosis present

## 2021-09-12 LAB — BODY FLUID CELL COUNT WITH DIFFERENTIAL
Eos, Fluid: 0 %
Lymphs, Fluid: 51 %
Monocyte-Macrophage-Serous Fluid: 44 %
Neutrophil Count, Fluid: 5 %
Total Nucleated Cell Count, Fluid: 149 cu mm

## 2021-09-12 LAB — PROTEIN, PLEURAL OR PERITONEAL FLUID: Total protein, fluid: <3 g/dL

## 2021-09-12 LAB — ALBUMIN, PLEURAL OR PERITONEAL FLUID: Albumin, Fluid: <1.5 g/dL

## 2021-09-12 MED ORDER — ALBUMIN HUMAN 25 % IV SOLN
INTRAVENOUS | Status: AC
  Administered 2021-09-12: 25 g via INTRAVENOUS
  Filled 2021-09-12: qty 100

## 2021-09-12 MED ORDER — ALBUMIN HUMAN 25 % IV SOLN: 25.0000 g | Freq: Once | INTRAVENOUS | Status: AC

## 2021-09-12 NOTE — Procedures (Signed)
PROCEDURE SUMMARY:  Successful US guided paracentesis from right abdomen.  Yielded 10.9 L of clear yellow fluid.  No immediate complications.  Pt tolerated well.   Specimen sent for labs.  EBL < 2 mL  Theresa Duty, NP 09/12/2021 12:13 PM

## 2021-09-13 LAB — PROTEIN, BODY FLUID (OTHER): Total Protein, Body Fluid Other: 1.8 g/dL

## 2021-09-13 LAB — CYTOLOGY - NON PAP

## 2021-09-17 LAB — AEROBIC/ANAEROBIC CULTURE W GRAM STAIN (SURGICAL/DEEP WOUND): Culture: NO GROWTH

## 2021-09-24 ENCOUNTER — Other Ambulatory Visit: Payer: Self-pay | Admitting: Gastroenterology

## 2021-09-24 DIAGNOSIS — K7581 Nonalcoholic steatohepatitis (NASH): Secondary | ICD-10-CM

## 2021-09-24 DIAGNOSIS — R188 Other ascites: Secondary | ICD-10-CM

## 2021-09-24 DIAGNOSIS — K7460 Unspecified cirrhosis of liver: Secondary | ICD-10-CM

## 2021-09-25 DIAGNOSIS — K7581 Nonalcoholic steatohepatitis (NASH): Secondary | ICD-10-CM | POA: Diagnosis not present

## 2021-09-25 DIAGNOSIS — K7460 Unspecified cirrhosis of liver: Secondary | ICD-10-CM | POA: Diagnosis not present

## 2021-09-26 DIAGNOSIS — J90 Pleural effusion, not elsewhere classified: Secondary | ICD-10-CM | POA: Diagnosis not present

## 2021-09-26 DIAGNOSIS — K7460 Unspecified cirrhosis of liver: Secondary | ICD-10-CM | POA: Diagnosis not present

## 2021-09-26 DIAGNOSIS — K766 Portal hypertension: Secondary | ICD-10-CM | POA: Diagnosis not present

## 2021-09-26 DIAGNOSIS — R188 Other ascites: Secondary | ICD-10-CM | POA: Diagnosis not present

## 2021-09-26 DIAGNOSIS — K8020 Calculus of gallbladder without cholecystitis without obstruction: Secondary | ICD-10-CM | POA: Diagnosis not present

## 2021-09-26 DIAGNOSIS — K7469 Other cirrhosis of liver: Secondary | ICD-10-CM | POA: Diagnosis not present

## 2021-09-27 ENCOUNTER — Ambulatory Visit
Admission: RE | Admit: 2021-09-27 | Discharge: 2021-09-27 | Disposition: A | Discharge status: Home / Self Care | Payer: 59 | Source: Ambulatory Visit | Attending: Gastroenterology | Admitting: Gastroenterology

## 2021-09-27 ENCOUNTER — Telehealth: Payer: Self-pay | Admitting: Gastroenterology

## 2021-09-27 ENCOUNTER — Other Ambulatory Visit: Payer: Self-pay

## 2021-09-27 DIAGNOSIS — K7581 Nonalcoholic steatohepatitis (NASH): Secondary | ICD-10-CM | POA: Diagnosis present

## 2021-09-27 DIAGNOSIS — R188 Other ascites: Secondary | ICD-10-CM | POA: Insufficient documentation

## 2021-09-27 DIAGNOSIS — K7469 Other cirrhosis of liver: Secondary | ICD-10-CM | POA: Diagnosis not present

## 2021-09-27 DIAGNOSIS — K7460 Unspecified cirrhosis of liver: Secondary | ICD-10-CM | POA: Diagnosis present

## 2021-09-27 DIAGNOSIS — J90 Pleural effusion, not elsewhere classified: Secondary | ICD-10-CM

## 2021-09-27 MED ORDER — ALBUMIN HUMAN 25 % IV SOLN: 25.0000 g | Freq: Once | INTRAVENOUS | Status: AC

## 2021-09-27 MED ORDER — ALBUMIN HUMAN 25 % IV SOLN
INTRAVENOUS | Status: AC
  Administered 2021-09-27: 25 g via INTRAVENOUS
  Filled 2021-09-27: qty 100

## 2021-09-27 NOTE — Telephone Encounter (Signed)
Order entered for thoracentesis.

## 2021-09-27 NOTE — Procedures (Signed)
PROCEDURE SUMMARY:  Successful US guided paracentesis from right lateral abdomen.  Yielded 8.3 liters of clear yellow fluid.  No immediate complications.  Patient tolerated well.  EBL = trace  Reizy Dunlow S Raksha Wolfgang PA-C 09/27/2021 12:22 PM

## 2021-10-01 ENCOUNTER — Ambulatory Visit
Admission: RE | Admit: 2021-10-01 | Discharge: 2021-10-01 | Disposition: A | Discharge status: Home / Self Care | Payer: 59 | Source: Ambulatory Visit | Attending: Gastroenterology | Admitting: Gastroenterology

## 2021-10-01 ENCOUNTER — Other Ambulatory Visit: Payer: Self-pay | Admitting: Gastroenterology

## 2021-10-01 DIAGNOSIS — K7581 Nonalcoholic steatohepatitis (NASH): Secondary | ICD-10-CM | POA: Diagnosis not present

## 2021-10-01 DIAGNOSIS — J90 Pleural effusion, not elsewhere classified: Secondary | ICD-10-CM

## 2021-10-01 DIAGNOSIS — Z0389 Encounter for observation for other suspected diseases and conditions ruled out: Secondary | ICD-10-CM | POA: Diagnosis not present

## 2021-10-01 DIAGNOSIS — J948 Other specified pleural conditions: Secondary | ICD-10-CM | POA: Diagnosis not present

## 2021-10-01 DIAGNOSIS — K7469 Other cirrhosis of liver: Secondary | ICD-10-CM | POA: Diagnosis not present

## 2021-10-02 ENCOUNTER — Other Ambulatory Visit: Payer: Self-pay | Admitting: Gastroenterology

## 2021-10-02 DIAGNOSIS — K7460 Unspecified cirrhosis of liver: Secondary | ICD-10-CM

## 2021-10-04 ENCOUNTER — Other Ambulatory Visit: Payer: Self-pay

## 2021-10-04 ENCOUNTER — Ambulatory Visit
Admission: RE | Admit: 2021-10-04 | Discharge: 2021-10-04 | Disposition: A | Discharge status: Home / Self Care | Payer: 59 | Source: Ambulatory Visit | Attending: Gastroenterology | Admitting: Gastroenterology

## 2021-10-04 DIAGNOSIS — K7460 Unspecified cirrhosis of liver: Secondary | ICD-10-CM | POA: Insufficient documentation

## 2021-10-04 DIAGNOSIS — R188 Other ascites: Secondary | ICD-10-CM | POA: Diagnosis not present

## 2021-10-04 DIAGNOSIS — K7581 Nonalcoholic steatohepatitis (NASH): Secondary | ICD-10-CM | POA: Diagnosis not present

## 2021-10-04 MED ORDER — ALBUMIN HUMAN 25 % IV SOLN: 25.0000 g | Freq: Once | INTRAVENOUS | Status: AC

## 2021-10-04 MED ORDER — ALBUMIN HUMAN 25 % IV SOLN
INTRAVENOUS | Status: AC
  Filled 2021-10-04: qty 100

## 2021-10-04 MED ORDER — ALBUMIN HUMAN 25 % IV SOLN
INTRAVENOUS | Status: AC
  Administered 2021-10-04: 25 g via INTRAVENOUS
  Filled 2021-10-04: qty 100

## 2021-10-04 NOTE — Procedures (Signed)
PROCEDURE SUMMARY:  Successful image-guided paracentesis from the right upper abdomen.  Yielded 11.0 liters of clear yellow fluid.  No immediate complications.  EBL <  1 mL Patient tolerated well.   Specimen was not sent for labs.  Please see imaging section of Epic for full dictation.  Joaquim Nam PA-C 10/04/2021 10:59 AM

## 2021-10-09 ENCOUNTER — Other Ambulatory Visit: Payer: Self-pay | Admitting: Gastroenterology

## 2021-10-09 DIAGNOSIS — K7460 Unspecified cirrhosis of liver: Secondary | ICD-10-CM

## 2021-10-11 ENCOUNTER — Other Ambulatory Visit: Payer: Self-pay

## 2021-10-11 ENCOUNTER — Ambulatory Visit
Admission: RE | Admit: 2021-10-11 | Discharge: 2021-10-11 | Disposition: A | Discharge status: Home / Self Care | Payer: 59 | Source: Ambulatory Visit | Attending: Gastroenterology | Admitting: Gastroenterology

## 2021-10-11 DIAGNOSIS — K7460 Unspecified cirrhosis of liver: Secondary | ICD-10-CM | POA: Diagnosis not present

## 2021-10-11 DIAGNOSIS — K7581 Nonalcoholic steatohepatitis (NASH): Secondary | ICD-10-CM | POA: Diagnosis not present

## 2021-10-11 DIAGNOSIS — R188 Other ascites: Secondary | ICD-10-CM | POA: Diagnosis not present

## 2021-10-11 MED ORDER — ALBUMIN HUMAN 25 % IV SOLN
INTRAVENOUS | Status: AC
  Administered 2021-10-11: 25 g
  Filled 2021-10-11: qty 100

## 2021-10-11 MED ORDER — ALBUMIN HUMAN 25 % IV SOLN
INTRAVENOUS | Status: AC
  Administered 2021-10-11: 25 g via INTRAVENOUS
  Filled 2021-10-11: qty 100

## 2021-10-11 MED ORDER — ALBUMIN HUMAN 25 % IV SOLN: 25.0000 g | Freq: Once | INTRAVENOUS | Status: DC

## 2021-10-11 MED ORDER — ALBUMIN HUMAN 25 % IV SOLN: 25.0000 g | Freq: Once | INTRAVENOUS | Status: AC

## 2021-10-11 NOTE — Progress Notes (Signed)
Rachel Huber, [name]   CLINIC:  Medical Oncology/Hematology  PCP:  [name], MD [address] [place] [address] [phone]   REASON FOR VISIT:  Follow-up for iron deficiency anemia and thalassemia  PRIOR THERAPY: Blood transfusions as needed  CURRENT THERAPY: IV iron infusions as needed (last iron given 08/15/2021 through 08/30/2021)  INTERVAL HISTORY:  [name] 66 y.o. female returns for routine follow-up of iron deficiency anemia secondary to chronic blood loss and malabsorption.  She was last seen by Tarri Abernethy PA-C on 08/07/2021.  At today's visit, she reports feeling fair.  No recent hospitalizations, surgeries, or changes in baseline health status.  She did not note any changes in her symptoms or improvement in her energy levels after her most recent IV iron. She continues to have occasional rectal bleeding at least once per month, that we usually last 5 to 7 days.  Bleeding varies in severity from scant red blood on tissue paper to enough blood to turn toilet water red.  She denies any melena, epistaxis, hematemesis, or hematuria. She reports baseline fatigue with energy about 80%. She describes "stress headaches" that begin in her shoulders and travel to her neck and head. She denies any pica or restless leg symptoms. No chest pain, dyspnea on exertion, lightheadedness, or syncope.   She is taking vitamin D3 supplements but is not taking any B12 or iron supplementation. She continues to receive regular paracentesis for her decompensated liver cirrhosis with ascites.  She is hoping to be able to receive TIPS procedure.  She has 80% energy and 100% appetite. She endorses that she is maintaining a stable weight.   REVIEW OF SYSTEMS:  Review of Systems  Constitutional:  Positive for fatigue. Negative for appetite change, chills, diaphoresis, fever and unexpected weight  change.  HENT:   Negative for lump/mass and nosebleeds.   Eyes:  Negative for eye problems.  Respiratory:  Negative for cough, hemoptysis and shortness of breath.   Cardiovascular:  Positive for leg swelling. Negative for chest pain and palpitations.  Gastrointestinal:  Positive for abdominal distention, blood in stool and diarrhea. Negative for abdominal pain, constipation, nausea and vomiting.  Genitourinary:  Negative for hematuria.   Skin: Negative.   Neurological:  Positive for dizziness, headaches and numbness. Negative for light-headedness.  Hematological:  Does not bruise/bleed easily.  Psychiatric/Behavioral:  The patient is nervous/anxious.      PAST MEDICAL/SURGICAL HISTORY:  Past Medical History:  Diagnosis Date   Anxiety    Arthritis    Cirrhosis of liver ([place])    Diabetes mellitus without complication (HCC)    Dyspnea    DOE   GERD (gastroesophageal reflux disease)    Hepatitis    PAST   Hypertension    Neuropathy    Neuropathy, diabetic ([place])    Pneumonia    Sinus complaint    Sleep apnea    CPAP   Thalassemia minor 1992   Past Surgical History:  Procedure Laterality Date   ABDOMINAL HYSTERECTOMY     BREAST BIOPSY Right 02/15/2018   [place] bx 6-6:30 ribbon shape, ONE CORE FRAGMENT WITH FIBROSIS. ONE CORE FRAGMENT WITH PORTION OF A DILATED   BREAST BIOPSY Right 02/15/2018   [place] bx 9:00 heart shape, USUAL DUCTAL HYPERPLASIA   BREAST LUMPECTOMY Right 03/09/2018   Procedure: BREAST LUMPECTOMY x 2;  Surgeon: Benjamine Sprague, DO;  Location: [facility] ORS;  Service: General;  Laterality: Right;   CATARACT EXTRACTION W/PHACO Left 06/11/2016   Procedure: CATARACT EXTRACTION PHACO AND INTRAOCULAR LENS PLACEMENT (IOC);  Surgeon: Estill Cotta, MD;  Location: [facility] ORS;  Service: Ophthalmology;  Laterality: Left;  Lot # [id number] H US:01:38.6 AP%:26.4 CDE:44.15   CATARACT EXTRACTION W/PHACO Right 06/15/2018   Procedure: CATARACT EXTRACTION PHACO AND INTRAOCULAR LENS PLACEMENT  (IOC);  Surgeon: Birder Robson, MD;  Location: [facility] ORS;  Service: Ophthalmology;  Laterality: Right;  [place] 00:38.2 CDE 4.23 Fluid Pack Lot # [id number]   COLONOSCOPIES     COLONOSCOPY WITH PROPOFOL N/A 10/10/2020   Procedure: COLONOSCOPY WITH PROPOFOL;  Surgeon: Lesly Rubenstein, MD;  Location: [facility] ENDOSCOPY;  Service: Endoscopy;  Laterality: N/A;   COLONOSCOPY WITH PROPOFOL N/A 11/20/2020   Procedure: COLONOSCOPY WITH PROPOFOL;  Surgeon: Lesly Rubenstein, MD;  Location: [facility] ENDOSCOPY;  Service: Endoscopy;  Laterality: N/A;  DM STAT CBC, BMP COVID POSITIVE 09/02/2020   CTR     ESOPHAGOGASTRODUODENOSCOPY (EGD) WITH PROPOFOL N/A 10/10/2020   Procedure: ESOPHAGOGASTRODUODENOSCOPY (EGD) WITH PROPOFOL;  Surgeon: Lesly Rubenstein, MD;  Location: [facility] ENDOSCOPY;  Service: Endoscopy;  Laterality: N/A;  COVID POSITIVE 10/08/2020   JOINT REPLACEMENT     KNEE SURGERY Right 09/24/2017   plates and pins   TOTAL SHOULDER ARTHROPLASTY Right 09/27/2015     SOCIAL HISTORY:  Social History   Socioeconomic History   Marital status: Married    Spouse name: Not on file   Number of children: 0   Years of education: Not on file   Highest education level: Not on file  Occupational History   Occupation: EMPLOYED  Tobacco Use   Smoking status: Never   Smokeless tobacco: Never  Vaping Use   Vaping Use: Never used  Substance and Sexual Activity   Alcohol use: No   Drug use: Never   Sexual activity: Not Currently  Other Topics Concern   Not on file  Social History Narrative   Not on file   Social Determinants of Health   Financial Resource Strain: Low Risk    Difficulty of Paying Living Expenses: Not hard at all  Food Insecurity: No Food Insecurity   Worried About Charity fundraiser in the Last Year: Never true   [place] in the Last Year: Never true  Transportation Needs: No Transportation Needs   Lack of Transportation (Medical): No   Lack of Transportation (Non-Medical): No   Physical Activity: Inactive   Days of Exercise per Week: 0 days   Minutes of Exercise per Session: 0 min  Stress: No Stress Concern Present   Feeling of Stress : Not at all  Social Connections: Moderately Isolated   Frequency of Communication with Friends and Family: More than three times a week   Frequency of Social Gatherings with Friends and Family: Once a week   Attends Religious Services: Never   Marine scientist or Organizations: No   Attends Music therapist: Never   Marital Status: Married  Human resources officer Violence: Not At Risk   Fear of Current or Ex-Partner: No   Emotionally Abused: No   Physically Abused: No   Sexually Abused: No    FAMILY HISTORY:  Family History  Problem Relation Age of Onset   Breast cancer Mother 21   Lymphoma Mother    Diabetes Father    Kidney cancer Father    Heart disease Father    Diabetes Sister    Breast cancer Sister 23    CURRENT  MEDICATIONS:  Outpatient Encounter Medications as of 10/14/2021  Medication Sig   acetaminophen (TYLENOL) 650 MG CR tablet Take 1,300 mg by mouth daily as needed for pain.   atorvastatin (LIPITOR) 10 MG tablet Take 10 mg by mouth daily.   Cholecalciferol 50 MCG (2000 UT) CAPS Take by mouth.   diphenhydrAMINE (BENADRYL) 25 MG tablet Take 25 mg by mouth at bedtime as needed.   furosemide (LASIX) 20 MG tablet Take by mouth.   gabapentin (NEURONTIN) 400 MG capsule Take 800 mg by mouth daily at 2 am.   guaiFENesin-dextromethorphan (ROBITUSSIN DM) 100-10 MG/5ML syrup Take 5 mLs by mouth every 4 (four) hours as needed for cough.   hydrocortisone (ANUSOL-HC) 25 MG suppository Place rectally.   melatonin 5 MG TABS Take 5 mg by mouth at bedtime as needed (sleep).   methocarbamol (ROBAXIN) 500 MG tablet Take 1 tablet (500 mg total) by mouth every 8 (eight) hours as needed for muscle spasms.   Multiple Vitamins-Minerals (MULTIVITAMIN WITH MINERALS) tablet Take 1 tablet by mouth daily.    ondansetron (ZOFRAN-ODT) 4 MG disintegrating tablet Take by mouth.   oxyCODONE-acetaminophen (PERCOCET/ROXICET) 5-325 MG tablet Take 1 tablet by mouth every 6 (six) hours as needed for severe pain.   oxymetazoline (AFRIN) 0.05 % nasal spray Place 1 spray into both nostrils 2 (two) times daily as needed.   sertraline (ZOLOFT) 50 MG tablet Take 50 mg by mouth daily.   spironolactone (ALDACTONE) 100 MG tablet Take by mouth.   TRULICITY 4.5 CH/8.5ID SOPN Inject 4.5 mg into the skin every Wednesday.   vitamin B-12 (CYANOCOBALAMIN) 500 MCG tablet Take 1 tablet (500 mcg total) by mouth daily.   Facility-Administered Encounter Medications as of 10/14/2021  Medication   albumin human 25 % solution 25 g    ALLERGIES:  Allergies  Allergen Reactions   Latex Rash    Contact rash     PHYSICAL EXAM:  ECOG PERFORMANCE STATUS: 1 - Symptomatic but completely ambulatory  There were no vitals filed for this visit. There were no vitals filed for this visit. Physical Exam Constitutional:      Appearance: Normal appearance. She is obese.  HENT:     Head: Normocephalic and atraumatic.     Mouth/Throat:     Mouth: Mucous membranes are moist.  Eyes:     Extraocular Movements: Extraocular movements intact.     Pupils: Pupils are equal, round, and reactive to light.  Cardiovascular:     Rate and Rhythm: Normal rate and regular rhythm.     Pulses: Normal pulses.     Heart sounds: Normal heart sounds.  Pulmonary:     Effort: Pulmonary effort is normal.     Breath sounds: Normal breath sounds.  Abdominal:     General: Bowel sounds are normal. There is distension.     Palpations: Abdomen is soft.     Tenderness: There is no abdominal tenderness.  Musculoskeletal:        General: No swelling.     Right lower leg: Edema (1+) present.     Left lower leg: Edema (1+) present.  Lymphadenopathy:     Cervical: No cervical adenopathy.  Skin:    General: Skin is warm and dry.  Neurological:     General:  No focal deficit present.     Mental Status: She is alert and oriented to person, place, and time.  Psychiatric:        Mood and Affect: Mood normal.  Behavior: Behavior normal.     LABORATORY DATA:  I have reviewed the labs as listed.  CBC    Component Value Date/Time   WBC 4.5 07/22/2021 1408   RBC 3.87 07/22/2021 1408   HGB 7.5 (L) 07/22/2021 1408   HCT 24.7 (L) 07/22/2021 1408   PLT 198 07/22/2021 1408   MCV 63.8 (L) 07/22/2021 1408   MCH 19.4 (L) 07/22/2021 1408   MCHC 30.4 07/22/2021 1408   RDW 17.6 (H) 07/22/2021 1408   LYMPHSABS 0.8 06/24/2021 1300   MONOABS 0.3 06/24/2021 1300   EOSABS 0.1 06/24/2021 1300   BASOSABS 0.0 06/24/2021 1300   CMP Latest Ref Rng & Units 11/20/2020 10/29/2020 10/27/2020  Glucose 70 - 99 mg/dL 153(H) 118(H) 78  BUN 8 - 23 mg/dL 27(H) 14 21  Creatinine 0.44 - 1.00 mg/dL 1.50(H) 0.92 0.92  Sodium 135 - 145 mmol/L 136 139 137  Potassium 3.5 - 5.1 mmol/L 4.4 3.6 3.4(L)  Chloride 98 - 111 mmol/L 100 106 107  CO2 22 - 32 mmol/L 27 26 22   Calcium 8.9 - 10.3 mg/dL 9.1 8.4(L) 8.5(L)  Total Protein 6.5 - 8.1 g/dL - 5.6(L) 5.5(L)  Total Bilirubin 0.3 - 1.2 mg/dL - 1.0 0.9  Alkaline Phos 38 - 126 U/L - 89 84  AST 15 - 41 U/L - 28 25  ALT 0 - 44 U/L - 20 20    DIAGNOSTIC IMAGING:  I have independently reviewed the relevant imaging and discussed with the patient.  ASSESSMENT & PLAN: 1.  Iron deficiency anemia - Suspected cause of anemia is due to chronic blood loss and malabsorption, as well as thalassemia (see below) - Her hemoglobin has been as low as 4.6, and she has received blood transfusions in the past.  Last blood transfusion was on 10/12/2020. - In April 2022, patient had episode of severe rectal bleeding with Hgb 8.8 and MCV 59.5 - Colonoscopies in February 2022 & April 2022 showed grade III/IV nonbleeding internal hemorrhoids, polyps, and area of significantly congested mucosa - EGD in February 2022 & April 2022 showed portal  hypertensive gastropathy and erythematous duodenopathy - Unable to tolerate oral iron supplement, failed to improved on oral iron supplementation - She does not take any NSAIDs or antiplatelet agents - Most recent IV iron from 08/15/2021 through 08/30/2021 - Most recent labs (10/14/2021): Hgb 7.6/MCV 65.6, ferritin 101, iron saturation 32% - Additional labs showed borderline vitamin B12 deficiency (see below), but normal copper, folate, SPEP. - Intermittent rectal bleeding, last episode 2 weeks ago - Chronic fatigue is at baseline  - Goal is to keep ferritin level 100-200 (due to chronic GI blood loss and comorbid CKD); goal Hgb is 9.0-10.0 due to patient's underlying thalassemia - PLAN: No IV iron at this time, but we will continue to monitor closely. - Repeat labs and RTC in 2 months.   2.  Vitamin B12 deficiency - Noted on 06/24/2021 to have normal vitamin B12 457, but with elevated methylmalonic acid 496 - She does not currently take any B12 supplement  - PLAN: Recommend vitamin B12 supplementation with B12 cyanocobalamin 500 mcg daily  - We will recheck B12/methylmalonic acid at follow-up visit in about 4 months (June 2023)  3.  Beta thalassemia minor - She reports that she has always been anemic, but was not diagnosed with thalassemia until around age 36.  Her father, sister, and brother all have thalassemia. - Her hemoglobin has been as low as 4.6, and she has received  only  1 blood transfusion in the past related to her rectal bleeding.  She has not required regular transfusion for her thalassemia. 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  We will transfuse as needed if < 7.0 or severely symptomatic from anemia.   4.  Vitamin D deficiency -Vitamin D deficiency noted at 19.97 (06/24/2021) - Patient was started on vitamin D 1000 units daily  - PLAN: Continue vitamin D 1000 units daily.  We will recheck levels in 4 months (June 2023)   5.  History of uterine cancer - Diagnosed in 2010, she has total hysterectomy and bilateral oophorectomy - She did not require any chemotherapy or radiation - She was treated in [place], [place] - Her last CT imaging of her abdomen and pelvis showed no evidence of cancer recurrence   6.  Decompensated non-alcoholic liver cirrhosis - Diagnosed in February 2022 - She has recurrent ascites from liver cirrhosis, requires intermittent paracentesis - EGD showed signs of portal hypertension - She is aware that she should avoid NSAID and aspirin containing patients indefinitely - She follows with [facility] as well as [facility] - She is hoping to be eligible for TIPS procedure soon   PLAN SUMMARY & DISPOSITION: Start taking B12 supplement Repeat labs and RTC in 2 months  All questions were answered. The patient knows to call the clinic with any problems, questions or concerns.  Medical decision making: Moderate  Time spent on visit: I spent 20 minutes counseling the patient face to face. The total time spent in the appointment was 30 minutes and more than 50% was on counseling.   Harriett Rush, PA-C  10/14/2021 7:02 PM

## 2021-10-11 NOTE — Procedures (Signed)
PROCEDURE SUMMARY:  Successful US guided paracentesis from right abdomen.  Yielded 6 L of clear yellow fluid.  No immediate complications.  Pt tolerated well.   EBL < 2 mL  Theresa Duty, NP 10/11/2021 2:25 PM

## 2021-10-14 ENCOUNTER — Inpatient Hospital Stay (HOSPITAL_COMMUNITY): Payer: 59

## 2021-10-14 ENCOUNTER — Inpatient Hospital Stay (HOSPITAL_COMMUNITY): Payer: 59 | Attending: Physician Assistant | Admitting: Physician Assistant

## 2021-10-14 ENCOUNTER — Other Ambulatory Visit: Payer: Self-pay

## 2021-10-14 VITALS — BP 116/49 | HR 88 | Temp 98.8°F | Resp 19 | Ht 71.0 in | Wt 243.6 lb

## 2021-10-14 DIAGNOSIS — K7460 Unspecified cirrhosis of liver: Secondary | ICD-10-CM | POA: Diagnosis not present

## 2021-10-14 DIAGNOSIS — R5382 Chronic fatigue, unspecified: Secondary | ICD-10-CM | POA: Diagnosis not present

## 2021-10-14 DIAGNOSIS — D500 Iron deficiency anemia secondary to blood loss (chronic): Secondary | ICD-10-CM

## 2021-10-14 DIAGNOSIS — Z90710 Acquired absence of both cervix and uterus: Secondary | ICD-10-CM | POA: Insufficient documentation

## 2021-10-14 DIAGNOSIS — K643 Fourth degree hemorrhoids: Secondary | ICD-10-CM | POA: Diagnosis not present

## 2021-10-14 DIAGNOSIS — N189 Chronic kidney disease, unspecified: Secondary | ICD-10-CM | POA: Insufficient documentation

## 2021-10-14 DIAGNOSIS — I129 Hypertensive chronic kidney disease with stage 1 through stage 4 chronic kidney disease, or unspecified chronic kidney disease: Secondary | ICD-10-CM | POA: Diagnosis not present

## 2021-10-14 DIAGNOSIS — E538 Deficiency of other specified B group vitamins: Secondary | ICD-10-CM | POA: Diagnosis not present

## 2021-10-14 DIAGNOSIS — K7290 Hepatic failure, unspecified without coma: Secondary | ICD-10-CM | POA: Diagnosis not present

## 2021-10-14 DIAGNOSIS — E1122 Type 2 diabetes mellitus with diabetic chronic kidney disease: Secondary | ICD-10-CM | POA: Insufficient documentation

## 2021-10-14 DIAGNOSIS — D563 Thalassemia minor: Secondary | ICD-10-CM

## 2021-10-14 DIAGNOSIS — Z8542 Personal history of malignant neoplasm of other parts of uterus: Secondary | ICD-10-CM

## 2021-10-14 LAB — CBC WITH DIFFERENTIAL/PLATELET
Abs Immature Granulocytes: 0.01 10*3/uL (ref 0.00–0.07)
Basophils Absolute: 0 10*3/uL (ref 0.0–0.1)
Basophils Relative: 0 %
Eosinophils Absolute: 0.1 10*3/uL (ref 0.0–0.5)
Eosinophils Relative: 2 %
HCT: 25.7 % — ABNORMAL LOW (ref 36.0–46.0)
Hemoglobin: 7.6 g/dL — ABNORMAL LOW (ref 12.0–15.0)
Immature Granulocytes: 0 %
Lymphocytes Relative: 13 %
Lymphs Abs: 0.8 10*3/uL (ref 0.7–4.0)
MCH: 19.4 pg — ABNORMAL LOW (ref 26.0–34.0)
MCHC: 29.6 g/dL — ABNORMAL LOW (ref 30.0–36.0)
MCV: 65.6 fL — ABNORMAL LOW (ref 80.0–100.0)
Monocytes Absolute: 0.4 10*3/uL (ref 0.1–1.0)
Monocytes Relative: 7 %
Neutro Abs: 4.6 10*3/uL (ref 1.7–7.7)
Neutrophils Relative %: 78 %
Platelets: 208 10*3/uL (ref 150–400)
RBC: 3.92 MIL/uL (ref 3.87–5.11)
RDW: 16.9 % — ABNORMAL HIGH (ref 11.5–15.5)
WBC: 6 10*3/uL (ref 4.0–10.5)
nRBC: 0 % (ref 0.0–0.2)

## 2021-10-14 LAB — IRON AND TIBC
Iron: 79 ug/dL (ref 28–170)
Saturation Ratios: 32 % — ABNORMAL HIGH (ref 10.4–31.8)
TIBC: 246 ug/dL — ABNORMAL LOW (ref 250–450)
UIBC: 167 ug/dL

## 2021-10-14 LAB — VITAMIN B12: Vitamin B-12: 434 pg/mL (ref 180–914)

## 2021-10-14 LAB — FERRITIN: Ferritin: 101 ng/mL (ref 11–307)

## 2021-10-14 LAB — FOLATE: Folate: 10.3 ng/mL (ref >5.9)

## 2021-10-14 NOTE — Patient Instructions (Signed)
[place] at [facility] Discharge Instructions  You were seen today by Tarri Abernethy PA-C for your iron deficiency anemia and beta thalassemia minor.  You do not need any IV iron at this time.  We will check your labs and see you back for another visit in 8 weeks.  You should START taking Vitamin B12 (over-the-counter) once daily.  Continue your Vitamin D supplement.   Thank you for choosing [place] at [facility] to provide your oncology and hematology care.  To afford each patient quality time with our provider, please arrive at least 15 minutes before your scheduled appointment time.   If you have a lab appointment with the [facility] please come in thru the Main Entrance and check in at the main information desk.  You need to re-schedule your appointment should you arrive 10 or more minutes late.  We strive to give you quality time with our providers, and arriving late affects you and other patients whose appointments are after yours.  Also, if you no show three or more times for appointments you may be dismissed from the clinic at the providers discretion.     Again, thank you for choosing [facility].  Our hope is that these requests will decrease the amount of time that you wait before being seen by our physicians.       _____________________________________________________________  Should you have questions after your visit to [facility], please contact our office at [phone] and follow the prompts.  Our office hours are 8:00 a.m. and 4:30 p.m. Monday - Friday.  Please note that voicemails left after 4:00 p.m. may not be returned until the following business day.  We are closed weekends and major holidays.  You do have access to a nurse 24-7, just call the main number to the clinic [phone] and do not press any options, hold on the line and a nurse will answer the phone.    For prescription  refill requests, have your pharmacy contact our office and allow 72 hours.    Due to Covid, you will need to wear a mask upon entering the hospital. If you do not have a mask, a mask will be given to you at the Main Entrance upon arrival. For doctor visits, patients may have 1 support person age 44 or older with them. For treatment visits, patients can not have anyone with them due to social distancing guidelines and our immunocompromised population.

## 2021-10-16 ENCOUNTER — Other Ambulatory Visit: Payer: Self-pay | Admitting: Gastroenterology

## 2021-10-16 DIAGNOSIS — K7581 Nonalcoholic steatohepatitis (NASH): Secondary | ICD-10-CM

## 2021-10-16 DIAGNOSIS — K7460 Unspecified cirrhosis of liver: Secondary | ICD-10-CM

## 2021-10-17 LAB — METHYLMALONIC ACID, SERUM: Methylmalonic Acid, Quantitative: 528 nmol/L — ABNORMAL HIGH (ref 0–378)

## 2021-10-22 ENCOUNTER — Other Ambulatory Visit: Payer: Self-pay | Admitting: Gastroenterology

## 2021-10-22 ENCOUNTER — Ambulatory Visit
Admission: RE | Admit: 2021-10-22 | Discharge: 2021-10-22 | Disposition: A | Discharge status: Home / Self Care | Payer: 59 | Source: Ambulatory Visit | Attending: Gastroenterology | Admitting: Gastroenterology

## 2021-10-22 ENCOUNTER — Other Ambulatory Visit: Payer: Self-pay

## 2021-10-22 DIAGNOSIS — K7460 Unspecified cirrhosis of liver: Secondary | ICD-10-CM

## 2021-10-22 DIAGNOSIS — K7581 Nonalcoholic steatohepatitis (NASH): Secondary | ICD-10-CM | POA: Insufficient documentation

## 2021-10-22 DIAGNOSIS — R188 Other ascites: Secondary | ICD-10-CM | POA: Diagnosis not present

## 2021-10-22 MED ORDER — ALBUMIN HUMAN 25 % IV SOLN
INTRAVENOUS | Status: AC
  Administered 2021-10-22: 25 g via INTRAVENOUS
  Filled 2021-10-22: qty 200

## 2021-10-22 MED ORDER — ALBUMIN HUMAN 25 % IV SOLN
25.0000 g | Freq: Once | INTRAVENOUS | Status: AC
  Administered 2021-10-22: 25 g via INTRAVENOUS

## 2021-10-22 MED ORDER — ALBUMIN HUMAN 25 % IV SOLN: 25.0000 g | Freq: Once | INTRAVENOUS | Status: AC

## 2021-10-22 MED ORDER — ALBUMIN HUMAN 25 % IV SOLN: 25.0000 g | Freq: Once | INTRAVENOUS | Status: DC

## 2021-10-22 NOTE — Procedures (Signed)
PROCEDURE SUMMARY: ? ?Successful US guided paracentesis from RUQ.  ?Yielded 6 L of clear yellow fluid.  ?No immediate complications.  ?Pt tolerated well.  ? ?Specimen was not sent for labs. ? ?EBL < 61m ? ?MTsosie BillingD PA-C ?10/22/2021 ?1:23 PM ? ? ? ?

## 2021-10-23 DIAGNOSIS — Z86010 Personal history of colonic polyps: Secondary | ICD-10-CM | POA: Diagnosis not present

## 2021-10-23 DIAGNOSIS — Z23 Encounter for immunization: Secondary | ICD-10-CM | POA: Diagnosis not present

## 2021-10-23 DIAGNOSIS — K7469 Other cirrhosis of liver: Secondary | ICD-10-CM | POA: Diagnosis not present

## 2021-10-23 DIAGNOSIS — K766 Portal hypertension: Secondary | ICD-10-CM | POA: Diagnosis not present

## 2021-10-29 ENCOUNTER — Ambulatory Visit
Admission: RE | Admit: 2021-10-29 | Discharge: 2021-10-29 | Disposition: A | Discharge status: Home / Self Care | Payer: 59 | Source: Ambulatory Visit | Attending: Gastroenterology | Admitting: Gastroenterology

## 2021-10-29 ENCOUNTER — Other Ambulatory Visit: Payer: Self-pay

## 2021-10-29 DIAGNOSIS — R188 Other ascites: Secondary | ICD-10-CM | POA: Diagnosis not present

## 2021-10-29 DIAGNOSIS — K7581 Nonalcoholic steatohepatitis (NASH): Secondary | ICD-10-CM | POA: Insufficient documentation

## 2021-10-29 DIAGNOSIS — K7460 Unspecified cirrhosis of liver: Secondary | ICD-10-CM | POA: Insufficient documentation

## 2021-10-29 MED ORDER — ALBUMIN HUMAN 25 % IV SOLN
25.0000 g | Freq: Once | INTRAVENOUS | Status: AC
  Administered 2021-10-29: 25 g via INTRAVENOUS

## 2021-10-29 MED ORDER — ALBUMIN HUMAN 25 % IV SOLN: 25.0000 g | Freq: Once | INTRAVENOUS | Status: AC

## 2021-10-29 MED ORDER — ALBUMIN HUMAN 25 % IV SOLN
INTRAVENOUS | Status: AC
Start: 2021-10-29 — End: 2021-10-29
  Filled 2021-10-29: qty 100

## 2021-10-29 MED ORDER — ALBUMIN HUMAN 25 % IV SOLN
INTRAVENOUS | Status: AC
Start: 2021-10-29 — End: 2021-10-29
  Administered 2021-10-29: 25 g via INTRAVENOUS
  Filled 2021-10-29: qty 100

## 2021-10-29 NOTE — Procedures (Signed)
PROCEDURE SUMMARY: ? ?Successful US guided paracentesis from LLQ.  ?Yielded 6.4 L of clear yellow fluid.  ?No immediate complications.  ?Pt tolerated well.  ? ?Specimen was not sent for labs. ? ?EBL < 49m ? ?MTsosie BillingD PA-C ?10/29/2021 ?1:45 PM ? ? ? ?

## 2021-11-05 ENCOUNTER — Ambulatory Visit
Admission: RE | Admit: 2021-11-05 | Discharge: 2021-11-05 | Disposition: A | Discharge status: Home / Self Care | Payer: 59 | Source: Ambulatory Visit | Attending: Gastroenterology | Admitting: Gastroenterology

## 2021-11-05 ENCOUNTER — Other Ambulatory Visit: Payer: Self-pay

## 2021-11-05 DIAGNOSIS — K7460 Unspecified cirrhosis of liver: Secondary | ICD-10-CM | POA: Insufficient documentation

## 2021-11-05 DIAGNOSIS — K7589 Other specified inflammatory liver diseases: Secondary | ICD-10-CM | POA: Diagnosis not present

## 2021-11-05 DIAGNOSIS — R188 Other ascites: Secondary | ICD-10-CM | POA: Diagnosis not present

## 2021-11-05 DIAGNOSIS — K7581 Nonalcoholic steatohepatitis (NASH): Secondary | ICD-10-CM | POA: Diagnosis present

## 2021-11-05 MED ORDER — ALBUMIN HUMAN 25 % IV SOLN: 50.0000 g | Freq: Once | INTRAVENOUS | Status: AC

## 2021-11-05 MED ORDER — ALBUMIN HUMAN 25 % IV SOLN
INTRAVENOUS | Status: AC
  Administered 2021-11-05: 50 g via INTRAVENOUS
  Filled 2021-11-05: qty 200

## 2021-11-05 NOTE — Procedures (Signed)
PROCEDURE SUMMARY: ? ?Successful US guided paracentesis from LLQ.  ?Yielded 6 L of clear yellow fluid.  ?No immediate complications.  ?Pt tolerated well.  ? ?Specimen was not sent for labs. ? ?EBL < 11m ? ?MTsosie BillingD PA-C ?11/05/2021 ?1:59 PM ? ? ? ?

## 2021-11-11 DIAGNOSIS — Z01811 Encounter for preprocedural respiratory examination: Secondary | ICD-10-CM | POA: Diagnosis not present

## 2021-11-11 DIAGNOSIS — Z01810 Encounter for preprocedural cardiovascular examination: Secondary | ICD-10-CM | POA: Diagnosis not present

## 2021-11-11 DIAGNOSIS — G4733 Obstructive sleep apnea (adult) (pediatric): Secondary | ICD-10-CM | POA: Diagnosis not present

## 2021-11-11 DIAGNOSIS — Z01818 Encounter for other preprocedural examination: Secondary | ICD-10-CM | POA: Diagnosis not present

## 2021-11-11 DIAGNOSIS — I499 Cardiac arrhythmia, unspecified: Secondary | ICD-10-CM | POA: Diagnosis not present

## 2021-11-11 DIAGNOSIS — K766 Portal hypertension: Secondary | ICD-10-CM | POA: Diagnosis not present

## 2021-11-11 DIAGNOSIS — N179 Acute kidney failure, unspecified: Secondary | ICD-10-CM | POA: Diagnosis not present

## 2021-11-11 DIAGNOSIS — E7800 Pure hypercholesterolemia, unspecified: Secondary | ICD-10-CM | POA: Diagnosis not present

## 2021-11-11 DIAGNOSIS — K7460 Unspecified cirrhosis of liver: Secondary | ICD-10-CM | POA: Diagnosis not present

## 2021-11-11 DIAGNOSIS — E1140 Type 2 diabetes mellitus with diabetic neuropathy, unspecified: Secondary | ICD-10-CM | POA: Diagnosis not present

## 2021-11-11 DIAGNOSIS — N201 Calculus of ureter: Secondary | ICD-10-CM | POA: Diagnosis not present

## 2021-11-11 DIAGNOSIS — I10 Essential (primary) hypertension: Secondary | ICD-10-CM | POA: Diagnosis not present

## 2021-11-12 ENCOUNTER — Ambulatory Visit: Admission: RE | Admit: 2021-11-12 | Payer: 59 | Source: Ambulatory Visit

## 2021-11-12 DIAGNOSIS — Z96611 Presence of right artificial shoulder joint: Secondary | ICD-10-CM | POA: Diagnosis not present

## 2021-11-12 DIAGNOSIS — Z01811 Encounter for preprocedural respiratory examination: Secondary | ICD-10-CM | POA: Diagnosis not present

## 2021-11-12 DIAGNOSIS — Z01818 Encounter for other preprocedural examination: Secondary | ICD-10-CM | POA: Diagnosis not present

## 2021-11-12 DIAGNOSIS — Z01810 Encounter for preprocedural cardiovascular examination: Secondary | ICD-10-CM | POA: Diagnosis not present

## 2021-11-12 DIAGNOSIS — Z7682 Awaiting organ transplant status: Secondary | ICD-10-CM | POA: Diagnosis not present

## 2021-11-13 DIAGNOSIS — K7469 Other cirrhosis of liver: Secondary | ICD-10-CM | POA: Diagnosis not present

## 2021-11-13 DIAGNOSIS — Z7682 Awaiting organ transplant status: Secondary | ICD-10-CM | POA: Diagnosis not present

## 2021-11-13 DIAGNOSIS — I10 Essential (primary) hypertension: Secondary | ICD-10-CM | POA: Diagnosis not present

## 2021-11-13 DIAGNOSIS — E11319 Type 2 diabetes mellitus with unspecified diabetic retinopathy without macular edema: Secondary | ICD-10-CM | POA: Diagnosis not present

## 2021-11-13 DIAGNOSIS — K7581 Nonalcoholic steatohepatitis (NASH): Secondary | ICD-10-CM | POA: Diagnosis not present

## 2021-11-13 DIAGNOSIS — Z008 Encounter for other general examination: Secondary | ICD-10-CM | POA: Diagnosis not present

## 2021-11-13 DIAGNOSIS — Z01818 Encounter for other preprocedural examination: Secondary | ICD-10-CM | POA: Diagnosis not present

## 2021-11-13 DIAGNOSIS — K766 Portal hypertension: Secondary | ICD-10-CM | POA: Diagnosis not present

## 2021-11-13 DIAGNOSIS — G4733 Obstructive sleep apnea (adult) (pediatric): Secondary | ICD-10-CM | POA: Diagnosis not present

## 2021-11-13 DIAGNOSIS — E1140 Type 2 diabetes mellitus with diabetic neuropathy, unspecified: Secondary | ICD-10-CM | POA: Diagnosis not present

## 2021-11-14 DIAGNOSIS — Z7682 Awaiting organ transplant status: Secondary | ICD-10-CM | POA: Diagnosis not present

## 2021-11-14 DIAGNOSIS — Z01818 Encounter for other preprocedural examination: Secondary | ICD-10-CM | POA: Diagnosis not present

## 2021-11-15 DIAGNOSIS — J90 Pleural effusion, not elsewhere classified: Secondary | ICD-10-CM | POA: Diagnosis not present

## 2021-11-15 DIAGNOSIS — I517 Cardiomegaly: Secondary | ICD-10-CM | POA: Diagnosis not present

## 2021-11-15 DIAGNOSIS — Z01818 Encounter for other preprocedural examination: Secondary | ICD-10-CM | POA: Diagnosis not present

## 2021-11-15 DIAGNOSIS — Z7682 Awaiting organ transplant status: Secondary | ICD-10-CM | POA: Diagnosis not present

## 2021-11-19 ENCOUNTER — Ambulatory Visit
Admission: RE | Admit: 2021-11-19 | Discharge: 2021-11-19 | Disposition: A | Discharge status: Home / Self Care | Payer: 59 | Source: Ambulatory Visit | Attending: Gastroenterology | Admitting: Gastroenterology

## 2021-11-19 DIAGNOSIS — K7460 Unspecified cirrhosis of liver: Secondary | ICD-10-CM | POA: Insufficient documentation

## 2021-11-19 DIAGNOSIS — K7469 Other cirrhosis of liver: Secondary | ICD-10-CM | POA: Diagnosis not present

## 2021-11-19 DIAGNOSIS — R188 Other ascites: Secondary | ICD-10-CM | POA: Diagnosis not present

## 2021-11-19 DIAGNOSIS — K7581 Nonalcoholic steatohepatitis (NASH): Secondary | ICD-10-CM | POA: Insufficient documentation

## 2021-11-19 MED ORDER — ALBUMIN HUMAN 25 % IV SOLN
INTRAVENOUS | Status: AC
  Administered 2021-11-19: 25 g
  Filled 2021-11-19: qty 100

## 2021-11-19 NOTE — Procedures (Signed)
PROCEDURE SUMMARY: ? ?Successful ultrasound guided paracentesis from the LLQ. ?Yielded 6 L of clear yellow fluid.  ?No immediate complications.  ?The patient tolerated the procedure well.  ? ?Specimen was not sent for labs. ? ?EBL <  48m ? ?The patient has required >/=2 paracenteses in a 30 day period and [facility]. ? ?[facility]. ? ?MTsosie BillingD, PA-C ?11/19/2021, 3:03 PM ? ? ? ?

## 2021-11-26 ENCOUNTER — Ambulatory Visit
Admission: RE | Admit: 2021-11-26 | Discharge: 2021-11-26 | Disposition: A | Discharge status: Home / Self Care | Payer: 59 | Source: Ambulatory Visit | Attending: Gastroenterology | Admitting: Gastroenterology

## 2021-11-26 DIAGNOSIS — K7581 Nonalcoholic steatohepatitis (NASH): Secondary | ICD-10-CM | POA: Diagnosis present

## 2021-11-26 DIAGNOSIS — K7460 Unspecified cirrhosis of liver: Secondary | ICD-10-CM | POA: Diagnosis present

## 2021-11-26 DIAGNOSIS — R188 Other ascites: Secondary | ICD-10-CM | POA: Diagnosis not present

## 2021-11-26 DIAGNOSIS — K7469 Other cirrhosis of liver: Secondary | ICD-10-CM | POA: Diagnosis not present

## 2021-11-26 MED ORDER — ALBUMIN HUMAN 25 % IV SOLN
INTRAVENOUS | Status: AC
  Administered 2021-11-26: 25 g via INTRAVENOUS
  Filled 2021-11-26: qty 100

## 2021-11-26 MED ORDER — ALBUMIN HUMAN 25 % IV SOLN: 25.0000 g | Freq: Once | INTRAVENOUS | Status: AC

## 2021-11-26 NOTE — Procedures (Signed)
PROCEDURE SUMMARY: ? ?Successful ultrasound guided paracentesis from the RUQ.  ?Yielded 8 L of clear yellow fluid.  ?No immediate complications.  ?The patient tolerated the procedure well.  ? ?Specimen was not sent for labs. ? ?EBL <  1m ? ?The patient has previously [facility] and is being actively followed for potential future intervention. ? ?MTsosie BillingD, PA-C ?11/26/2021, 1:03 PM ? ? ? ?

## 2021-12-03 ENCOUNTER — Ambulatory Visit: Payer: 59

## 2021-12-03 DIAGNOSIS — R188 Other ascites: Secondary | ICD-10-CM | POA: Diagnosis not present

## 2021-12-03 DIAGNOSIS — E118 Type 2 diabetes mellitus with unspecified complications: Secondary | ICD-10-CM | POA: Diagnosis not present

## 2021-12-03 DIAGNOSIS — T753XXD Motion sickness, subsequent encounter: Secondary | ICD-10-CM | POA: Diagnosis not present

## 2021-12-03 DIAGNOSIS — X58XXXD Exposure to other specified factors, subsequent encounter: Secondary | ICD-10-CM | POA: Diagnosis not present

## 2021-12-03 DIAGNOSIS — Z79899 Other long term (current) drug therapy: Secondary | ICD-10-CM | POA: Diagnosis not present

## 2021-12-03 DIAGNOSIS — D563 Thalassemia minor: Secondary | ICD-10-CM | POA: Diagnosis not present

## 2021-12-03 DIAGNOSIS — Z01818 Encounter for other preprocedural examination: Secondary | ICD-10-CM | POA: Diagnosis not present

## 2021-12-03 DIAGNOSIS — K7469 Other cirrhosis of liver: Secondary | ICD-10-CM | POA: Diagnosis not present

## 2021-12-03 DIAGNOSIS — G4733 Obstructive sleep apnea (adult) (pediatric): Secondary | ICD-10-CM | POA: Diagnosis not present

## 2021-12-03 DIAGNOSIS — Z6841 Body Mass Index (BMI) 40.0 and over, adult: Secondary | ICD-10-CM | POA: Diagnosis not present

## 2021-12-03 DIAGNOSIS — K766 Portal hypertension: Secondary | ICD-10-CM | POA: Diagnosis not present

## 2021-12-03 DIAGNOSIS — G4730 Sleep apnea, unspecified: Secondary | ICD-10-CM | POA: Diagnosis not present

## 2021-12-03 DIAGNOSIS — Z9989 Dependence on other enabling machines and devices: Secondary | ICD-10-CM | POA: Diagnosis not present

## 2021-12-04 ENCOUNTER — Ambulatory Visit
Admission: RE | Admit: 2021-12-04 | Discharge: 2021-12-04 | Disposition: A | Discharge status: Home / Self Care | Payer: 59 | Source: Ambulatory Visit | Attending: Gastroenterology | Admitting: Gastroenterology

## 2021-12-04 DIAGNOSIS — K7581 Nonalcoholic steatohepatitis (NASH): Secondary | ICD-10-CM | POA: Diagnosis present

## 2021-12-04 DIAGNOSIS — K7460 Unspecified cirrhosis of liver: Secondary | ICD-10-CM | POA: Insufficient documentation

## 2021-12-04 DIAGNOSIS — R188 Other ascites: Secondary | ICD-10-CM | POA: Diagnosis not present

## 2021-12-04 MED ORDER — ALBUMIN HUMAN 25 % IV SOLN
25.0000 g | Freq: Once | INTRAVENOUS | Status: AC
Start: 2021-12-04 — End: 2021-12-04

## 2021-12-04 MED ORDER — ALBUMIN HUMAN 25 % IV SOLN
25.0000 g | Freq: Once | INTRAVENOUS | Status: AC
  Administered 2021-12-04: 25 g via INTRAVENOUS

## 2021-12-04 MED ORDER — ALBUMIN HUMAN 25 % IV SOLN
INTRAVENOUS | Status: AC
Start: 2021-12-04 — End: 2021-12-04
  Administered 2021-12-04: 25 g via INTRAVENOUS
  Filled 2021-12-04: qty 100

## 2021-12-04 NOTE — Procedures (Signed)
PROCEDURE SUMMARY: ? ?Successful US guided paracentesis from LLQ.  ?Yielded 8 L of clear yellow fluid.  ?No immediate complications.  ?Pt tolerated well.  ? ?Specimen was not sent for labs. ? ?EBL < 39m ? ?The [facility] and is being actively followed for potential future intervention if needed. [facility] ? ?MTsosie BillingD PA-C ?12/04/2021 ?11:54 AM ? ? ? ?

## 2021-12-05 DIAGNOSIS — M8588 Other specified disorders of bone density and structure, other site: Secondary | ICD-10-CM | POA: Diagnosis not present

## 2021-12-09 ENCOUNTER — Ambulatory Visit (HOSPITAL_COMMUNITY): Payer: 59 | Attending: Internal Medicine

## 2021-12-09 DIAGNOSIS — R2689 Other abnormalities of gait and mobility: Secondary | ICD-10-CM | POA: Insufficient documentation

## 2021-12-09 DIAGNOSIS — K7460 Unspecified cirrhosis of liver: Secondary | ICD-10-CM | POA: Insufficient documentation

## 2021-12-09 DIAGNOSIS — M6281 Muscle weakness (generalized): Secondary | ICD-10-CM | POA: Diagnosis not present

## 2021-12-09 DIAGNOSIS — R262 Difficulty in walking, not elsewhere classified: Secondary | ICD-10-CM | POA: Diagnosis not present

## 2021-12-09 NOTE — Therapy (Signed)
OUTPATIENT PHYSICAL THERAPY LOWER EXTREMITY EVALUATION   Patient Name: Rachel Huber MRN: [id number] DOB:03-28-1956, 66 y.o., female Today's Date: 12/09/2021   PT End of Session - 12/09/21 0903     Visit Number 1    Number of Visits 6    Date for PT Re-Evaluation 01/08/22    Authorization Type [employer] QH; 35 visit limit    Authorization - Visit Number 1    Authorization - Number of Visits 35    PT Start Time [phone]    PT Stop Time 0940    PT Time Calculation (min) 42 min             Past Medical History:  Diagnosis Date   Anxiety    Arthritis    Cirrhosis of liver (HCC)    Diabetes mellitus without complication (HCC)    Dyspnea    DOE   GERD (gastroesophageal reflux disease)    Hepatitis    PAST   Hypertension    Neuropathy    Neuropathy, diabetic (HCC)    Pneumonia    Sinus complaint    Sleep apnea    CPAP   Thalassemia minor 1992   Past Surgical History:  Procedure Laterality Date   ABDOMINAL HYSTERECTOMY     BREAST BIOPSY Right 02/15/2018   [place] bx 6-6:30 ribbon shape, ONE CORE FRAGMENT WITH FIBROSIS. ONE CORE FRAGMENT WITH PORTION OF A DILATED   BREAST BIOPSY Right 02/15/2018   [place] bx 9:00 heart shape, USUAL DUCTAL HYPERPLASIA   BREAST LUMPECTOMY Right 03/09/2018   Procedure: BREAST LUMPECTOMY x 2;  Surgeon: Sung Amabile, DO;  Location: [facility] ORS;  Service: General;  Laterality: Right;   CATARACT EXTRACTION W/PHACO Left 06/11/2016   Procedure: CATARACT EXTRACTION PHACO AND INTRAOCULAR LENS PLACEMENT (IOC);  Surgeon: Sallee Lange, MD;  Location: [facility] ORS;  Service: Ophthalmology;  Laterality: Left;  Lot # [id number] H US:01:38.6 AP%:26.4 CDE:44.15   CATARACT EXTRACTION W/PHACO Right 06/15/2018   Procedure: CATARACT EXTRACTION PHACO AND INTRAOCULAR LENS PLACEMENT (IOC);  Surgeon: Galen Manila, MD;  Location: [facility] ORS;  Service: Ophthalmology;  Laterality: Right;  [place] 00:38.2 CDE 4.23 Fluid Pack Lot # [id number]   COLONOSCOPIES     COLONOSCOPY  WITH PROPOFOL N/A 10/10/2020   Procedure: COLONOSCOPY WITH PROPOFOL;  Surgeon: Regis Bill, MD;  Location: [facility] ENDOSCOPY;  Service: Endoscopy;  Laterality: N/A;   COLONOSCOPY WITH PROPOFOL N/A 11/20/2020   Procedure: COLONOSCOPY WITH PROPOFOL;  Surgeon: Regis Bill, MD;  Location: [facility] ENDOSCOPY;  Service: Endoscopy;  Laterality: N/A;  DM STAT CBC, BMP COVID POSITIVE 09/02/2020   CTR     ESOPHAGOGASTRODUODENOSCOPY (EGD) WITH PROPOFOL N/A 10/10/2020   Procedure: ESOPHAGOGASTRODUODENOSCOPY (EGD) WITH PROPOFOL;  Surgeon: Regis Bill, MD;  Location: [facility] ENDOSCOPY;  Service: Endoscopy;  Laterality: N/A;  COVID POSITIVE 10/08/2020   JOINT REPLACEMENT     KNEE SURGERY Right 09/24/2017   plates and pins   TOTAL SHOULDER ARTHROPLASTY Right 09/27/2015   Patient Active Problem List   Diagnosis Date Noted   Iron deficiency anemia due to chronic blood loss 02/13/2021   History of uterine cancer 02/13/2021   Decompensated hepatic cirrhosis (HCC) 10/25/2020   OSA (obstructive sleep apnea) 10/25/2020   Glossitis 10/25/2020   Grade IV internal hemorrhoids    Acute blood loss anemia 10/08/2020   Hyponatremia 10/08/2020   Rectal bleeding 10/08/2020   Acute respiratory failure with hypoxia (HCC) 09/10/2020   Pneumonia due to COVID-19 virus 09/08/2020   Hip fracture, right (HCC)  09/04/2020   Closed right hip fracture (HCC) 09/02/2020   Diabetes mellitus with hyperglycemia (HCC) 09/02/2020   Hypertension    Thalassemia minor    Pancytopenia (HCC)    HLD (hyperlipidemia)    Acute renal failure superimposed on stage 3a chronic kidney disease (HCC)    Depression    Fall at home, initial encounter    Nondisplaced fracture of greater trochanter of right femur, initial encounter for closed fracture (HCC)     PCP: Lauro Regulus, MD  REFERRING PROVIDER: Lauro Regulus, MD  REFERRING DIAG: PT eval/tx for gait difficulty, R26.9 per Azucena Cecil, MD    THERAPY DIAG:  Difficulty in walking, not elsewhere classified  Muscle weakness (generalized)  Other abnormalities of gait and mobility  Cirrhosis, non-alcoholic (HCC)  ONSET DATE: worsened over the past 3 weeks  SUBJECTIVE:   SUBJECTIVE STATEMENT: Patient states because of her liver cirrhosis that They are drawing off less fluid because they are trying to protect her kidneys so she is holding fluid in her legs making it more difficult to walk; she is using a walker for safety. Having a TIPS procedure in a couple weeks to help with the fluid.   PERTINENT HISTORY: Trying to get on liver transplant list, DM R shoulder THA Feb 2019 R knee pain (OA)   PAIN:  Are you having pain? Yes: NPRS scale: 4/10 Pain location: my skin from the trunk down Pain description: tight, pulling, tender Aggravating factors: tender to touch Relieving factors: when fluid is removed  PRECAUTIONS: Fall;   WEIGHT BEARING RESTRICTIONS No  FALLS:  Has patient fallen in last 6 months? Yes. Number of falls 1  LIVING ENVIRONMENT: Lives with: lives with their family and lives with their spouse Lives in: House/apartment Stairs: Yes: External: 1 steps; none Has following equipment at home: Single point cane, Walker - 2 wheeled, Wheelchair (manual), shower chair, and bed side commode  OCCUPATION: retired  PLOF: Needs assistance with ADLs (showering needs assist and with socks and shoes) gets around the house with her RW modified independently  PATIENT GOALS walk without the cane; being able to stand up from a low chair   OBJECTIVE:   COGNITION:  Overall cognitive status: Within functional limits for tasks assessed     SENSATION: Light touch: hypersensitive to touch  POSTURE:  Forward flexed at the trunk with standing and walking; noted distended abdomen and swelling in legs.   PALPATION: Tightness, swelling noted bilateral lower extremities  LE ROM:  Active ROM Right 12/09/2021  Left 12/09/2021  Hip flexion    Hip extension    Hip abduction    Hip adduction    Hip internal rotation    Hip external rotation    Knee flexion    Knee extension    Ankle dorsiflexion limited limited  Ankle plantarflexion    Ankle inversion    Ankle eversion     (Blank rows = not tested)  LE MMT:  MMT Right 12/09/2021 Left 12/09/2021  Hip flexion 4- 4-  Hip extension    Hip abduction    Hip adduction    Hip internal rotation    Hip external rotation    Knee flexion 4 4-  Knee extension 4+ 4  Ankle dorsiflexion 2+ 2+  Ankle plantarflexion    Ankle inversion    Ankle eversion     (Blank rows = not tested)  BED MOBILITY: Needs moderate Assistance with legs with sit to supine and moderate assistance with  trunk for supine to sit  FUNCTIONAL TESTS:  2 MWT 138 ft with RW 5 x  sit to stand 31 sec with Ue's to assist up to stand      GAIT: Distance walked: 138 ft Assistive device utilized: Walker - 2 wheeled Level of assistance: SBA Comments: mildly SOB after walking    TODAY'S TREATMENT: Physical therapy evaluation, HEP   PATIENT EDUCATION:  Education details: HEP instruction, disease progression, balance impairments Person educated: Patient Education method: Explanation, Demonstration, and Handouts Education comprehension: verbalized understanding, returned demonstration, and needs further education   HOME EXERCISE PROGRAM: Access Code: [email or web address] URL: [email or web address] Date: 12/09/2021 Prepared by: AP - Rehab  Exercises - Seated Heel Toe Raises  - 2 x daily - 7 x weekly - 1 sets - 10 reps - Seated Long Arc Quad  - 2 x daily - 7 x weekly - 1 sets - 10 reps - Seated March  - 2 x daily - 7 x weekly - 1 sets - 10 reps - Supine Active Straight Leg Raise  - 7 x weekly - 1 sets - 10 reps  ASSESSMENT:  CLINICAL IMPRESSION: Patient is a 66 y.o. female who was seen today for physical therapy evaluation and treatment for gait abnormality;  balance impairment. She presents with noted swelling and fluid retention of her trunk and lower extremities, lower extremity weakness, sensation impairments that affect her standing balance and increase her fall risk.  She also gets short of breath with minimal exertion.  All of thes limitations are impairing her ability to perform ADLs and tasks in her home and limit her access to the community. Patient will benefit from skilled PT interventions to address  decreased functional  mobility and core weakness, LE weakness, bed mobility, transfer and gait training and the instruction, development and modification of HEP.   OBJECTIVE IMPAIRMENTS Abnormal gait, decreased activity tolerance, decreased balance, decreased coordination, decreased endurance, decreased mobility, difficulty walking, decreased ROM, decreased strength, decreased safety awareness, hypomobility, increased edema, impaired perceived functional ability, impaired flexibility, impaired sensation, improper body mechanics, and pain.   ACTIVITY LIMITATIONS cleaning, community activity, driving, meal prep, laundry, medication management, yard work, and shopping.   PERSONAL FACTORS Fitness and 1-2 comorbidities: liver cirrhosis and DM  are also affecting patient's functional outcome.    REHAB POTENTIAL: Fair secondary to progressive liver disease  CLINICAL DECISION MAKING: Stable/uncomplicated  EVALUATION COMPLEXITY: Low   GOALS: Goals reviewed with patient? No  SHORT TERM GOALS: Target date: 12/23/2021    Patient with be independent with initial HEP to  improve functional outcomes.  Goal status: INITIAL  2.  Patient will increased her to 155 ft to demonstrate improved functional mobility and easier access to her home enviroment Baseline: 138 ft Goal status: INITIAL   LONG TERM GOALS: Target date: 01/06/2022  Patient will be independent with advanced HEP and self management strategies to improve quality of life and  functional outcomes.  Goal status: INITIAL  2.  Patient will improve her 5 times STS score from 31 sec to 27 sec or less to demonstrate improved functional mobility and increased lower extremity strength Baseline: 31 sec Goal status: INITIAL  3.  Patient will increased her to 185 ft to demonstrate improved functional mobility and easier access to community Baseline: 138 ft Goal status: INITIAL  4.  Patient will be able to perform supine to sit and sit to supine modified independently to increase patient independence at home. Baseline: mod assist for supine to  sit and sit to supine Goal status: INITIAL  5.  Patient will report at least 50% improvement in overall symptoms and/or function to demonstrate improved functional mobility  Baseline:  Goal status: INITIAL    PLAN: PT FREQUENCY: 2x/week  PT DURATION: 2 weeks; then 1 x a week for 2 weeks as patient   PLANNED INTERVENTIONS: Therapeutic exercises, Therapeutic activity, Neuromuscular re-education, Balance training, Gait training, Patient/Family education, Joint manipulation, Joint mobilization, Stair training, Vestibular training, Orthotic/Fit training, DME instructions, Aquatic Therapy, Dry Needling, Electrical stimulation, Spinal manipulation, Spinal mobilization, Cryotherapy, Moist heat, Compression bandaging, Taping, Traction, Ultrasound, Contrast bath, Biofeedback, and Manual therapy  PLAN FOR NEXT SESSION: Review goals and HEP; work on bed mobility, core and LE strengthening. Patient having a TIPS procedure May 11th   10:07 AM, 12/09/21 Maurina Fawaz Small [place] Guiles MPT [facility] [place] [phone]

## 2021-12-10 ENCOUNTER — Ambulatory Visit
Admission: RE | Admit: 2021-12-10 | Discharge: 2021-12-10 | Disposition: A | Discharge status: Home / Self Care | Payer: 59 | Source: Ambulatory Visit | Attending: Gastroenterology | Admitting: Gastroenterology

## 2021-12-10 DIAGNOSIS — R188 Other ascites: Secondary | ICD-10-CM | POA: Diagnosis not present

## 2021-12-10 DIAGNOSIS — K7581 Nonalcoholic steatohepatitis (NASH): Secondary | ICD-10-CM | POA: Insufficient documentation

## 2021-12-10 DIAGNOSIS — K7460 Unspecified cirrhosis of liver: Secondary | ICD-10-CM | POA: Insufficient documentation

## 2021-12-10 MED ORDER — ALBUMIN HUMAN 25 % IV SOLN: 25.0000 g | Freq: Once | INTRAVENOUS | Status: AC

## 2021-12-10 MED ORDER — ALBUMIN HUMAN 25 % IV SOLN
INTRAVENOUS | Status: AC
  Administered 2021-12-10: 25 g
  Filled 2021-12-10: qty 100

## 2021-12-10 MED ORDER — ALBUMIN HUMAN 25 % IV SOLN
50.0000 g | Freq: Once | INTRAVENOUS | Status: AC
Start: 2021-12-10 — End: 2021-12-10

## 2021-12-10 MED ORDER — ALBUMIN HUMAN 25 % IV SOLN
INTRAVENOUS | Status: AC
  Administered 2021-12-10: 25 g via INTRAVENOUS
  Filled 2021-12-10: qty 100

## 2021-12-10 NOTE — Procedures (Signed)
PROCEDURE SUMMARY: ? ?Successful US guided paracentesis from RLQ.  ?Yielded 8L of clear yellow fluid.  ?No immediate complications.  ?Pt tolerated well.  ? ?Specimen not sent for labs. ? ?EBL negligible ? ?The patient has previously been formally evaluated by the [facility] and is being actively followed for potential future intervention if needed. Of note patient is currently being evaluated at [facility] for TIPS and transplant. ? ? ?Shaleena Crusoe PA-C ?12/10/2021 ?10:36 AM ? ? ? ?

## 2021-12-12 ENCOUNTER — Inpatient Hospital Stay (HOSPITAL_COMMUNITY): Payer: 59 | Attending: Hematology

## 2021-12-12 DIAGNOSIS — E559 Vitamin D deficiency, unspecified: Secondary | ICD-10-CM | POA: Diagnosis not present

## 2021-12-12 DIAGNOSIS — D509 Iron deficiency anemia, unspecified: Secondary | ICD-10-CM | POA: Diagnosis not present

## 2021-12-12 DIAGNOSIS — E538 Deficiency of other specified B group vitamins: Secondary | ICD-10-CM | POA: Insufficient documentation

## 2021-12-12 DIAGNOSIS — D500 Iron deficiency anemia secondary to blood loss (chronic): Secondary | ICD-10-CM

## 2021-12-12 DIAGNOSIS — D569 Thalassemia, unspecified: Secondary | ICD-10-CM | POA: Insufficient documentation

## 2021-12-12 LAB — CBC WITH DIFFERENTIAL/PLATELET
Abs Immature Granulocytes: 0.01 10*3/uL (ref 0.00–0.07)
Basophils Absolute: 0 10*3/uL (ref 0.0–0.1)
Basophils Relative: 1 %
Eosinophils Absolute: 0.2 10*3/uL (ref 0.0–0.5)
Eosinophils Relative: 3 %
HCT: 25.7 % — ABNORMAL LOW (ref 36.0–46.0)
Hemoglobin: 7.5 g/dL — ABNORMAL LOW (ref 12.0–15.0)
Immature Granulocytes: 0 %
Lymphocytes Relative: 18 %
Lymphs Abs: 0.8 10*3/uL (ref 0.7–4.0)
MCH: 18.3 pg — ABNORMAL LOW (ref 26.0–34.0)
MCHC: 29.2 g/dL — ABNORMAL LOW (ref 30.0–36.0)
MCV: 62.8 fL — ABNORMAL LOW (ref 80.0–100.0)
Monocytes Absolute: 0.4 10*3/uL (ref 0.1–1.0)
Monocytes Relative: 9 %
Neutro Abs: 3.3 10*3/uL (ref 1.7–7.7)
Neutrophils Relative %: 69 %
Platelets: 186 10*3/uL (ref 150–400)
RBC: 4.09 MIL/uL (ref 3.87–5.11)
RDW: 15.3 % (ref 11.5–15.5)
WBC Morphology: >10
WBC: 4.8 10*3/uL (ref 4.0–10.5)
nRBC: 0 % (ref 0.0–0.2)

## 2021-12-12 LAB — IRON AND TIBC
Iron: 70 ug/dL (ref 28–170)
Saturation Ratios: 25 % (ref 10.4–31.8)
TIBC: 280 ug/dL (ref 250–450)
UIBC: 210 ug/dL

## 2021-12-12 LAB — FERRITIN: Ferritin: 37 ng/mL (ref 11–307)

## 2021-12-16 ENCOUNTER — Encounter (HOSPITAL_COMMUNITY): Payer: Self-pay | Admitting: Physical Therapy

## 2021-12-16 ENCOUNTER — Ambulatory Visit (HOSPITAL_COMMUNITY): Payer: 59 | Admitting: Physical Therapy

## 2021-12-16 DIAGNOSIS — M6281 Muscle weakness (generalized): Secondary | ICD-10-CM

## 2021-12-16 DIAGNOSIS — Z8542 Personal history of malignant neoplasm of other parts of uterus: Secondary | ICD-10-CM | POA: Diagnosis not present

## 2021-12-16 DIAGNOSIS — Z8051 Family history of malignant neoplasm of kidney: Secondary | ICD-10-CM | POA: Diagnosis not present

## 2021-12-16 DIAGNOSIS — R2689 Other abnormalities of gait and mobility: Secondary | ICD-10-CM

## 2021-12-16 DIAGNOSIS — R262 Difficulty in walking, not elsewhere classified: Secondary | ICD-10-CM

## 2021-12-16 DIAGNOSIS — Z90710 Acquired absence of both cervix and uterus: Secondary | ICD-10-CM | POA: Diagnosis not present

## 2021-12-16 DIAGNOSIS — D500 Iron deficiency anemia secondary to blood loss (chronic): Secondary | ICD-10-CM | POA: Diagnosis not present

## 2021-12-16 DIAGNOSIS — Z803 Family history of malignant neoplasm of breast: Secondary | ICD-10-CM | POA: Diagnosis not present

## 2021-12-16 DIAGNOSIS — E119 Type 2 diabetes mellitus without complications: Secondary | ICD-10-CM | POA: Diagnosis not present

## 2021-12-16 DIAGNOSIS — Z807 Family history of other malignant neoplasms of lymphoid, hematopoietic and related tissues: Secondary | ICD-10-CM | POA: Diagnosis not present

## 2021-12-16 DIAGNOSIS — E538 Deficiency of other specified B group vitamins: Secondary | ICD-10-CM | POA: Diagnosis not present

## 2021-12-16 DIAGNOSIS — E559 Vitamin D deficiency, unspecified: Secondary | ICD-10-CM | POA: Diagnosis not present

## 2021-12-16 DIAGNOSIS — D563 Thalassemia minor: Secondary | ICD-10-CM | POA: Diagnosis not present

## 2021-12-16 DIAGNOSIS — K7460 Unspecified cirrhosis of liver: Secondary | ICD-10-CM | POA: Insufficient documentation

## 2021-12-16 NOTE — Therapy (Signed)
OUTPATIENT PHYSICAL THERAPY TREATMENT NOTE   Patient Name: Rachel Huber MRN: [id number] DOB:1956/05/08, 66 y.o., female Today's Date: 12/16/2021  PCP: Einar Crow MD  REFERRING PROVIDER: Azucena Cecil, MD   END OF SESSION:   PT End of Session - 12/16/21 1440     Visit Number 2    Number of Visits 6    Date for PT Re-Evaluation 01/08/22    Authorization Type [employer] QH; 35 visit limit    Authorization - Visit Number 2    Authorization - Number of Visits 35    PT Start Time 1436    PT Stop Time 1517    PT Time Calculation (min) 41 min    Activity Tolerance Patient tolerated treatment well;Patient limited by fatigue    Behavior During Therapy WFL for tasks assessed/performed             Past Medical History:  Diagnosis Date   Anxiety    Arthritis    Cirrhosis of liver (HCC)    Diabetes mellitus without complication (HCC)    Dyspnea    DOE   GERD (gastroesophageal reflux disease)    Hepatitis    PAST   Hypertension    Neuropathy    Neuropathy, diabetic (HCC)    Pneumonia    Sinus complaint    Sleep apnea    CPAP   Thalassemia minor 1992   Past Surgical History:  Procedure Laterality Date   ABDOMINAL HYSTERECTOMY     BREAST BIOPSY Right 02/15/2018   [place] bx 6-6:30 ribbon shape, ONE CORE FRAGMENT WITH FIBROSIS. ONE CORE FRAGMENT WITH PORTION OF A DILATED   BREAST BIOPSY Right 02/15/2018   [place] bx 9:00 heart shape, USUAL DUCTAL HYPERPLASIA   BREAST LUMPECTOMY Right 03/09/2018   Procedure: BREAST LUMPECTOMY x 2;  Surgeon: Sung Amabile, DO;  Location: [facility] ORS;  Service: General;  Laterality: Right;   CATARACT EXTRACTION W/PHACO Left 06/11/2016   Procedure: CATARACT EXTRACTION PHACO AND INTRAOCULAR LENS PLACEMENT (IOC);  Surgeon: Sallee Lange, MD;  Location: [facility] ORS;  Service: Ophthalmology;  Laterality: Left;  Lot # [id number] H US:01:38.6 AP%:26.4 CDE:44.15   CATARACT EXTRACTION W/PHACO Right 06/15/2018   Procedure: CATARACT  EXTRACTION PHACO AND INTRAOCULAR LENS PLACEMENT (IOC);  Surgeon: Galen Manila, MD;  Location: [facility] ORS;  Service: Ophthalmology;  Laterality: Right;  [place] 00:38.2 CDE 4.23 Fluid Pack Lot # [id number]   COLONOSCOPIES     COLONOSCOPY WITH PROPOFOL N/A 10/10/2020   Procedure: COLONOSCOPY WITH PROPOFOL;  Surgeon: Regis Bill, MD;  Location: [facility] ENDOSCOPY;  Service: Endoscopy;  Laterality: N/A;   COLONOSCOPY WITH PROPOFOL N/A 11/20/2020   Procedure: COLONOSCOPY WITH PROPOFOL;  Surgeon: Regis Bill, MD;  Location: [facility] ENDOSCOPY;  Service: Endoscopy;  Laterality: N/A;  DM STAT CBC, BMP COVID POSITIVE 09/02/2020   CTR     ESOPHAGOGASTRODUODENOSCOPY (EGD) WITH PROPOFOL N/A 10/10/2020   Procedure: ESOPHAGOGASTRODUODENOSCOPY (EGD) WITH PROPOFOL;  Surgeon: Regis Bill, MD;  Location: [facility] ENDOSCOPY;  Service: Endoscopy;  Laterality: N/A;  COVID POSITIVE 10/08/2020   JOINT REPLACEMENT     KNEE SURGERY Right 09/24/2017   plates and pins   TOTAL SHOULDER ARTHROPLASTY Right 09/27/2015   Patient Active Problem List   Diagnosis Date Noted   Iron deficiency anemia due to chronic blood loss 02/13/2021   History of uterine cancer 02/13/2021   Decompensated hepatic cirrhosis (HCC) 10/25/2020   OSA (obstructive sleep apnea) 10/25/2020   Glossitis 10/25/2020   Grade IV internal hemorrhoids  Acute blood loss anemia 10/08/2020   Hyponatremia 10/08/2020   Rectal bleeding 10/08/2020   Acute respiratory failure with hypoxia (HCC) 09/10/2020   Pneumonia due to COVID-19 virus 09/08/2020   Hip fracture, right (HCC) 09/04/2020   Closed right hip fracture (HCC) 09/02/2020   Diabetes mellitus with hyperglycemia (HCC) 09/02/2020   Hypertension    Thalassemia minor    Pancytopenia (HCC)    HLD (hyperlipidemia)    Acute renal failure superimposed on stage 3a chronic kidney disease (HCC)    Depression    Fall at home, initial encounter    Nondisplaced fracture of greater trochanter of right  femur, initial encounter for closed fracture (HCC)     REFERRING DIAG: PT eval/tx for gait difficulty, R26.9   THERAPY DIAG:  Difficulty in walking, not elsewhere classified  Muscle weakness (generalized)  Other abnormalities of gait and mobility  PERTINENT HISTORY: Trying to get on liver transplant list, DM R shoulder THA Feb 2019 R knee pain (OA)   PRECAUTIONS: Fall  SUBJECTIVE: Continued difficulty with walking. Continued swelling in [place].   PAIN:  Are you having pain? Yes: NPRS scale: 5/10 Pain location: LEs Pain description: sore Aggravating factors: swelling, bending knee, touch  Relieving factors: "not a lot", elevating feet   OBJECTIVE:    COGNITION:           Overall cognitive status: Within functional limits for tasks assessed                          SENSATION: Light touch: hypersensitive to touch   POSTURE:  Forward flexed at the trunk with standing and walking; noted distended abdomen and swelling in legs.    PALPATION: Tightness, swelling noted bilateral lower extremities   LE ROM:   Active ROM Right 12/09/2021 Left 12/09/2021  Hip flexion      Hip extension      Hip abduction      Hip adduction      Hip internal rotation      Hip external rotation      Knee flexion      Knee extension      Ankle dorsiflexion limited limited  Ankle plantarflexion      Ankle inversion      Ankle eversion       (Blank rows = not tested)   LE MMT:   MMT Right 12/09/2021 Left 12/09/2021  Hip flexion 4- 4-  Hip extension      Hip abduction      Hip adduction      Hip internal rotation      Hip external rotation      Knee flexion 4 4-  Knee extension 4+ 4  Ankle dorsiflexion 2+ 2+  Ankle plantarflexion      Ankle inversion      Ankle eversion       (Blank rows = not tested)   BED MOBILITY: Needs moderate Assistance with legs with sit to supine and moderate assistance with trunk for supine to sit   FUNCTIONAL TESTS:  2 MWT 138 ft with RW 5 x  sit  to stand 31 sec with Ue's to assist up to stand     GAIT: Distance walked: 138 ft Assistive device utilized: Environmental consultant - 2 wheeled Level of assistance: SBA Comments: mildly SOB after walking       TODAY'S TREATMENT: Physical therapy evaluation, HEP  12/16/21 Bridge 2 x 10 SLR x10 each  Heel raise  2 x 10  Step ups 4 inch x10 each  Nustep (seat 12) 5 min EOS for strength and conditioning lv 1    PATIENT EDUCATION:  Education details: HEP instruction, disease progression, balance impairments 12/16/21 on therapy goals, exercise form and function  Person educated: Patient Education method: Explanation, Demonstration, and Handouts Education comprehension: verbalized understanding, returned demonstration, and needs further education     HOME EXERCISE PROGRAM: Access Code: [email or web address] URL: [email or web address] Date: 12/09/2021 Prepared by: AP - Rehab   Exercises - Seated Heel Toe Raises  - 2 x daily - 7 x weekly - 1 sets - 10 reps - Seated Long Arc Quad  - 2 x daily - 7 x weekly - 1 sets - 10 reps - Seated March  - 2 x daily - 7 x weekly - 1 sets - 10 reps - Supine Active Straight Leg Raise  - 7 x weekly - 1 sets - 10 reps   ASSESSMENT:   CLINICAL IMPRESSION: Reviewed therapy goals and HEP. Patient with good tolerance overall, but is limited by fatigue. Does require several breaks for rest due to fatigue. Progressed LE strengthening with adding bridges and step ups. Added Nu step for strength and conditioning. Patient requires guidance for pacing of activity to allow sufficient rest to avoid excess fatigue with exercises. Patient will continue to benefit from skilled therapy services to reduce remaining deficits and improve functional ability.     OBJECTIVE IMPAIRMENTS Abnormal gait, decreased activity tolerance, decreased balance, decreased coordination, decreased endurance, decreased mobility, difficulty walking, decreased ROM, decreased strength, decreased safety  awareness, hypomobility, increased edema, impaired perceived functional ability, impaired flexibility, impaired sensation, improper body mechanics, and pain.    ACTIVITY LIMITATIONS cleaning, community activity, driving, meal prep, laundry, medication management, yard work, and shopping.    PERSONAL FACTORS Fitness and 1-2 comorbidities: liver cirrhosis and DM  are also affecting patient's functional outcome.      REHAB POTENTIAL: Fair secondary to progressive liver disease   CLINICAL DECISION MAKING: Stable/uncomplicated   EVALUATION COMPLEXITY: Low     GOALS: Goals reviewed with patient? No   SHORT TERM GOALS: Target date: 12/23/2021     Patient with be independent with initial HEP to  improve functional outcomes.   Goal status: INITIAL   2.  Patient will increased her to 155 ft to demonstrate improved functional mobility and easier access to her home enviroment Baseline: 138 ft Goal status: INITIAL     LONG TERM GOALS: Target date: 01/06/2022   Patient will be independent with advanced HEP and self management strategies to improve quality of life and functional outcomes.   Goal status: INITIAL   2.  Patient will improve her 5 times STS score from 31 sec to 27 sec or less to demonstrate improved functional mobility and increased lower extremity strength Baseline: 31 sec Goal status: INITIAL   3.  Patient will increased her to 185 ft to demonstrate improved functional mobility and easier access to community Baseline: 138 ft Goal status: INITIAL   4.  Patient will be able to perform supine to sit and sit to supine modified independently to increase patient independence at home. Baseline: mod assist for supine to sit and sit to supine Goal status: INITIAL   5.  Patient will report at least 50% improvement in overall symptoms and/or function to demonstrate improved functional mobility   Baseline:  Goal status: INITIAL       PLAN: PT FREQUENCY: 2x/week   PT  DURATION: 2 weeks; then 1 x a week for 2 weeks as patient    PLANNED INTERVENTIONS: Therapeutic exercises, Therapeutic activity, Neuromuscular re-education, Balance training, Gait training, Patient/Family education, Joint manipulation, Joint mobilization, Stair training, Vestibular training, Orthotic/Fit training, DME instructions, Aquatic Therapy, Dry Needling, Electrical stimulation, Spinal manipulation, Spinal mobilization, Cryotherapy, Moist heat, Compression bandaging, Taping, Traction, Ultrasound, Contrast bath, Biofeedback, and Manual therapy   PLAN FOR NEXT SESSION:  Bed mobility, core and LE strengthening. Patient having a TIPS procedure May 11th   3:16 PM, 12/16/21 Georges Lynch PT DPT  Physical Therapist with [facility]  [phone]

## 2021-12-17 ENCOUNTER — Ambulatory Visit
Admission: RE | Admit: 2021-12-17 | Discharge: 2021-12-17 | Disposition: A | Discharge status: Home / Self Care | Payer: 59 | Source: Ambulatory Visit | Attending: Gastroenterology | Admitting: Gastroenterology

## 2021-12-17 DIAGNOSIS — K7460 Unspecified cirrhosis of liver: Secondary | ICD-10-CM | POA: Diagnosis not present

## 2021-12-17 DIAGNOSIS — K7469 Other cirrhosis of liver: Secondary | ICD-10-CM | POA: Diagnosis not present

## 2021-12-17 DIAGNOSIS — R188 Other ascites: Secondary | ICD-10-CM | POA: Diagnosis not present

## 2021-12-17 DIAGNOSIS — K7581 Nonalcoholic steatohepatitis (NASH): Secondary | ICD-10-CM | POA: Insufficient documentation

## 2021-12-17 HISTORY — PX: IR PARACENTESIS: IMG2679

## 2021-12-17 MED ORDER — ALBUMIN HUMAN 25 % IV SOLN
25.0000 g | Freq: Once | INTRAVENOUS | Status: AC
  Filled 2021-12-17: qty 100

## 2021-12-17 MED ORDER — ALBUMIN HUMAN 25 % IV SOLN
25.0000 g | Freq: Once | INTRAVENOUS | Status: AC
Start: 2021-12-17 — End: 2021-12-17
  Filled 2021-12-17: qty 100

## 2021-12-17 MED ORDER — ALBUMIN HUMAN 25 % IV SOLN
INTRAVENOUS | Status: AC
  Administered 2021-12-17: 25 g via INTRAVENOUS
  Filled 2021-12-17: qty 100

## 2021-12-17 MED ORDER — LIDOCAINE HCL 1 % IJ SOLN
INTRAMUSCULAR | Status: AC
Start: 2021-12-17 — End: 2021-12-17
  Administered 2021-12-17: 10 mL
  Filled 2021-12-17: qty 20

## 2021-12-17 NOTE — Procedures (Signed)
PROCEDURE SUMMARY: ? ?Successful ultrasound guided paracentesis from the left lower quadrant. *8L removal maxiumum ?Yielded 8 of straw colred fluid.  ?No immediate complications.  ?The patient tolerated the procedure well.  ? ?EBL < 42m ? ?The patient [facility] and is being actively followed for potential future intervention.  [facility] ? ? ?DRuthann Cancer MD ?Pager: [phone]? ? ?

## 2021-12-18 NOTE — Progress Notes (Addendum)
? ?[facility] ?[address] ?[place], [place] [address] ? ? ?CLINIC:  ?Medical Oncology/Hematology ? ?PCP:  ?[name], MD ?[place] ?[place] [address] ?[phone] ? ? ?REASON FOR VISIT:  ?Follow-up for iron deficiency anemia and thalassemia ?  ?PRIOR THERAPY: Blood transfusions as needed ?  ?CURRENT THERAPY: IV iron infusions as needed ? ?INTERVAL HISTORY:  ?Rachel Huber 66 y.o. 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  She was last seen by Tarri Abernethy PA-C on 10/14/2021. ? ?At today's visit, she reports feeling fair.  No recent hospitalizations, surgeries, or changes in baseline health status. ? ?She continues to have rectal bleeding at least once a week, which seems to occur when she is most swollen prior to her paracentesis.  Bleeding varies in severity from scant red blood on tissue paper to enough blood to turn toilet water red.  She denies any melena, epistaxis, hematemesis, or hematuria. ? ?She reports baseline fatigue with energy about 70%.  Her headaches are stable at baseline.  She denies any pica or restless legs.  No chest pain.  She does have some intermittent dyspnea on exertion and shortness of breath when she has increased fluid buildup and ascites that is pressing on her diaphragm.  She feels lightheaded sometimes after paracentesis, but denies any syncopal episodes. ? ?She is taking vitamin D3 and vitamin B12 supplements ?She continues to receive regular paracentesis for her decompensated liver cirrhosis with ascites.  She is scheduled to have TIPS procedure next week. ? ?She has 70% energy and 100% appetite. She endorses that she is maintaining a stable weight. ? ? ?REVIEW OF SYSTEMS:  ?Review of Systems  ?Constitutional:  Positive for fatigue. Negative for appetite change, chills, diaphoresis, fever and unexpected weight  change.  ?HENT:   Negative for lump/mass and nosebleeds.   ?Eyes:  Negative for eye problems.  ?Respiratory:  Positive for shortness of breath. Negative for cough and hemoptysis.   ?Cardiovascular:  Negative for chest pain, leg swelling and palpitations.  ?Gastrointestinal:  Positive for abdominal distention and blood in stool. Negative for abdominal pain, constipation, diarrhea, nausea and vomiting.  ?Genitourinary:  Negative for hematuria.   ?Skin: Negative.   ?Neurological:  Positive for dizziness, headaches and light-headedness.  ?Hematological:  Does not bruise/bleed easily.   ? ? ?PAST MEDICAL/SURGICAL HISTORY:  ?Past Medical History:  ?Diagnosis Date  ? Anxiety   ? Arthritis   ? Cirrhosis of liver ([place])   ? Diabetes mellitus without complication ([place])   ? Dyspnea   ? DOE  ? GERD (gastroesophageal reflux disease)   ? Hepatitis   ? PAST  ? Hypertension   ? Neuropathy   ? Neuropathy, diabetic ([place])   ? Pneumonia   ? Sinus complaint   ? Sleep apnea   ? CPAP  ? Thalassemia minor 1992  ? ?Past Surgical History:  ?Procedure Laterality Date  ? ABDOMINAL HYSTERECTOMY    ? BREAST BIOPSY Right 02/15/2018  ? [place] bx 6-6:30 ribbon shape, ONE CORE FRAGMENT WITH FIBROSIS. ONE CORE FRAGMENT WITH PORTION OF A DILATED  ? BREAST BIOPSY Right 02/15/2018  ? [place] bx 9:00 heart shape, USUAL DUCTAL HYPERPLASIA  ? BREAST LUMPECTOMY Right 03/09/2018  ? Procedure: BREAST LUMPECTOMY x 2;  Surgeon: Benjamine Sprague, DO;  Location: [facility] ORS;  Service: General;  Laterality: Right;  ? CATARACT EXTRACTION W/PHACO Left  06/11/2016  ? Procedure: CATARACT EXTRACTION PHACO AND INTRAOCULAR LENS PLACEMENT (IOC);  Surgeon: Estill Cotta, MD;  Location: [facility] ORS;  Service: Ophthalmology;  Laterality: Left;  Lot # [id number] ?US:01:38.6 ?AP%:26.4 ?CDE:44.15  ? CATARACT EXTRACTION W/PHACO Right 06/15/2018  ? Procedure: CATARACT EXTRACTION PHACO AND INTRAOCULAR LENS PLACEMENT (IOC);  Surgeon: Birder Robson, MD;  Location: [facility] ORS;  Service: Ophthalmology;   Laterality: Right;  [place] 00:38.2 ?CDE 4.23 ?Fluid Pack Lot # [id number]  ? COLONOSCOPIES    ? COLONOSCOPY WITH PROPOFOL N/A 10/10/2020  ? Procedure: COLONOSCOPY WITH PROPOFOL;  Surgeon: Lesly Rubenstein, MD;  Location: [facility] ENDOSCOPY;  Service: Endoscopy;  Laterality: N/A;  ? COLONOSCOPY WITH PROPOFOL N/A 11/20/2020  ? Procedure: COLONOSCOPY WITH PROPOFOL;  Surgeon: Lesly Rubenstein, MD;  Location: [facility] ENDOSCOPY;  Service: Endoscopy;  Laterality: N/A;  DM ?STAT CBC, BMP ?COVID POSITIVE 09/02/2020  ? CTR    ? ESOPHAGOGASTRODUODENOSCOPY (EGD) WITH PROPOFOL N/A 10/10/2020  ? Procedure: ESOPHAGOGASTRODUODENOSCOPY (EGD) WITH PROPOFOL;  Surgeon: Lesly Rubenstein, MD;  Location: [facility] ENDOSCOPY;  Service: Endoscopy;  Laterality: N/A;  COVID POSITIVE 10/08/2020  ? IR PARACENTESIS  12/17/2021  ? JOINT REPLACEMENT    ? KNEE SURGERY Right 09/24/2017  ? plates and pins  ? TOTAL SHOULDER ARTHROPLASTY Right 09/27/2015  ? ? ? ?SOCIAL HISTORY:  ?Social History  ? ?Socioeconomic History  ? Marital status: Married  ?  Spouse name: Not on file  ? Number of children: 0  ? Years of education: Not on file  ? Highest education level: Not on file  ?Occupational History  ? Occupation: EMPLOYED  ?Tobacco Use  ? Smoking status: Never  ? Smokeless tobacco: Never  ?Vaping Use  ? Vaping Use: Never used  ?Substance and Sexual Activity  ? Alcohol use: No  ? Drug use: Never  ? Sexual activity: Not Currently  ?Other Topics Concern  ? Not on file  ?Social History Narrative  ? Not on file  ? ?Social Determinants of Health  ? ?Financial Resource Strain: Low Risk   ? Difficulty of Paying Living Expenses: Not hard at all  ?Food Insecurity: No Food Insecurity  ? Worried About Charity fundraiser in the Last Year: Never true  ? Ran Out of Food in the Last Year: Never true  ?Transportation Needs: No Transportation Needs  ? Lack of Transportation (Medical): No  ? Lack of Transportation (Non-Medical): No  ?Physical Activity: Inactive  ? Days of Exercise per  Week: 0 days  ? Minutes of Exercise per Session: 0 min  ?Stress: No Stress Concern Present  ? Feeling of Stress : Not at all  ?Social Connections: Moderately Isolated  ? Frequency of Communication with Friends and Family: More than three times a week  ? Frequency of Social Gatherings with Friends and Family: Once a week  ? Attends Religious Services: Never  ? Active Member of Clubs or Organizations: No  ? Attends Archivist Meetings: Never  ? Marital Status: Married  ?Intimate Partner Violence: Not At Risk  ? Fear of Current or Ex-Partner: No  ? Emotionally Abused: No  ? Physically Abused: No  ? Sexually Abused: No  ? ? ?FAMILY HISTORY:  ?Family History  ?Problem Relation Age of Onset  ? Breast cancer Mother 68  ? Lymphoma Mother   ? Diabetes Father   ? Kidney cancer Father   ? Heart disease Father   ? Diabetes Sister   ? Breast cancer Sister 65  ? ? ?CURRENT MEDICATIONS:  ?  Outpatient Encounter Medications as of 12/19/2021  ?Medication Sig  ? acetaminophen (TYLENOL) 650 MG CR tablet Take 1,300 mg by mouth daily as needed for pain.  ? atorvastatin (LIPITOR) 10 MG tablet Take 10 mg by mouth daily.  ? azelastine (ASTELIN) 0.1 % nasal spray Place into the nose.  ? Cholecalciferol 50 MCG (2000 UT) CAPS Take by mouth.  ? diphenhydrAMINE (BENADRYL) 25 MG tablet Take 25 mg by mouth at bedtime as needed.  ? Dulaglutide 4.5 MG/0.5ML SOPN Inject into the skin.  ? furosemide (LASIX) 20 MG tablet Take by mouth.  ? gabapentin (NEURONTIN) 400 MG capsule Take 800 mg by mouth daily at 2 am.  ? guaiFENesin-dextromethorphan (ROBITUSSIN DM) 100-10 MG/5ML syrup Take 5 mLs by mouth every 4 (four) hours as needed for cough.  ? hydrocortisone (ANUSOL-HC) 25 MG suppository Place rectally.  ? melatonin 5 MG TABS Take 5 mg by mouth at bedtime as needed (sleep).  ? methocarbamol (ROBAXIN) 500 MG tablet Take 1 tablet (500 mg total) by mouth every 8 (eight) hours as needed for muscle spasms.  ? Multiple Vitamins-Minerals (MULTIVITAMIN  WITH MINERALS) tablet Take 1 tablet by mouth daily.  ? ondansetron (ZOFRAN-ODT) 4 MG disintegrating tablet Take by mouth.  ? oxyCODONE-acetaminophen (PERCOCET/ROXICET) 5-325 MG tablet Take 1 tablet by mo

## 2021-12-19 ENCOUNTER — Inpatient Hospital Stay (HOSPITAL_COMMUNITY): Payer: 59 | Attending: Physician Assistant | Admitting: Physician Assistant

## 2021-12-19 ENCOUNTER — Ambulatory Visit (HOSPITAL_COMMUNITY): Payer: 59 | Admitting: Physical Therapy

## 2021-12-19 VITALS — BP 129/57 | HR 81 | Temp 98.5°F | Resp 20 | Ht 69.69 in | Wt 271.4 lb

## 2021-12-19 DIAGNOSIS — K7460 Unspecified cirrhosis of liver: Secondary | ICD-10-CM | POA: Insufficient documentation

## 2021-12-19 DIAGNOSIS — D563 Thalassemia minor: Secondary | ICD-10-CM | POA: Diagnosis not present

## 2021-12-19 DIAGNOSIS — E559 Vitamin D deficiency, unspecified: Secondary | ICD-10-CM | POA: Insufficient documentation

## 2021-12-19 DIAGNOSIS — E119 Type 2 diabetes mellitus without complications: Secondary | ICD-10-CM | POA: Insufficient documentation

## 2021-12-19 DIAGNOSIS — D500 Iron deficiency anemia secondary to blood loss (chronic): Secondary | ICD-10-CM | POA: Diagnosis not present

## 2021-12-19 DIAGNOSIS — Z8051 Family history of malignant neoplasm of kidney: Secondary | ICD-10-CM | POA: Insufficient documentation

## 2021-12-19 DIAGNOSIS — Z8542 Personal history of malignant neoplasm of other parts of uterus: Secondary | ICD-10-CM | POA: Insufficient documentation

## 2021-12-19 DIAGNOSIS — R262 Difficulty in walking, not elsewhere classified: Secondary | ICD-10-CM

## 2021-12-19 DIAGNOSIS — Z807 Family history of other malignant neoplasms of lymphoid, hematopoietic and related tissues: Secondary | ICD-10-CM | POA: Diagnosis not present

## 2021-12-19 DIAGNOSIS — R2689 Other abnormalities of gait and mobility: Secondary | ICD-10-CM

## 2021-12-19 DIAGNOSIS — M6281 Muscle weakness (generalized): Secondary | ICD-10-CM | POA: Diagnosis not present

## 2021-12-19 DIAGNOSIS — Z90710 Acquired absence of both cervix and uterus: Secondary | ICD-10-CM | POA: Diagnosis not present

## 2021-12-19 DIAGNOSIS — Z803 Family history of malignant neoplasm of breast: Secondary | ICD-10-CM | POA: Diagnosis not present

## 2021-12-19 DIAGNOSIS — E538 Deficiency of other specified B group vitamins: Secondary | ICD-10-CM | POA: Insufficient documentation

## 2021-12-19 NOTE — Patient Instructions (Signed)
[place] at [facility] ?Discharge Instructions ? ?You were seen today by Tarri Abernethy PA-C for your iron deficiency anemia and beta thalassemia minor.   ? ?Your iron (ferritin) level is less than 100, so we will schedule you for IV iron x2 doses. ? ?You should CONTINUE taking Vitamin B12 (over-the-counter) once daily.  Continue your Vitamin D supplement. ? ?We will repeat labs and see you for follow-up visit in 3 months. ? ? ?Thank you for choosing [place] at [facility] to provide your oncology and hematology care.  To afford each patient quality time with our provider, please arrive at least 15 minutes before your scheduled appointment time.  ? ?If you have a lab appointment with the [facility] please come in thru the Main Entrance and check in at the main information desk. ? ?You need to re-schedule your appointment should you arrive 10 or more minutes late.  We strive to give you quality time with our providers, and arriving late affects you and other patients whose appointments are after yours.  Also, if you no show three or more times for appointments you may be dismissed from the clinic at the providers discretion.     ?Again, thank you for choosing [facility].  Our hope is that these requests will decrease the amount of time that you wait before being seen by our physicians.       ?_____________________________________________________________ ? ?Should you have questions after your visit to [facility], please contact our office at [phone] and follow the prompts.  Our office hours are 8:00 a.m. and 4:30 p.m. Monday - Friday.  Please note that voicemails left after 4:00 p.m. may not be returned until the following business day.  We are closed weekends and major holidays.  You do have access to a nurse 24-7, just call the main number to the clinic [phone] and do not press any options, hold on the line and a  nurse will answer the phone.   ? ?For prescription refill requests, have your pharmacy contact our office and allow 72 hours.   ? ?Due to Covid, you will need to wear a mask upon entering the hospital. If you do not have a mask, a mask will be given to you at the Main Entrance upon arrival. For doctor visits, patients may have 1 support person age 54 or older with them. For treatment visits, patients can not have anyone with them due to social distancing guidelines and our immunocompromised population.  ? ? ? ?

## 2021-12-19 NOTE — Therapy (Signed)
?OUTPATIENT PHYSICAL THERAPY TREATMENT NOTE ? ? ?Patient Name: Rachel Huber ?MRN: [id number] ?DOB:[birth year], 66 y.o., female ?Today's Date: 12/19/2021 ? ?PCP:  [place] MD  ?REFERRING PROVIDER: Eulis Canner, MD  ? ?END OF SESSION:  ? PT End of Session - 12/19/21 1143   ? ? Visit Number 3   ? Number of Visits 6   ? Date for PT Re-Evaluation 01/08/22   ? Authorization Type [employer] QH; 35 visit limit   ? Authorization - Visit Number 3   ? Authorization - Number of Visits 35   ? PT Start Time 1136   ? PT Stop Time 1218   ? PT Time Calculation (min) 42 min   ? Activity Tolerance Patient tolerated treatment well;Patient limited by fatigue   ? Behavior During Therapy [facility] for tasks assessed/performed   ? ?  ?  ? ?  ? ? ?Past Medical History:  ?Diagnosis Date  ? Anxiety   ? Arthritis   ? Cirrhosis of liver ([place])   ? Diabetes mellitus without complication ([place])   ? Dyspnea   ? DOE  ? GERD (gastroesophageal reflux disease)   ? Hepatitis   ? PAST  ? Hypertension   ? Neuropathy   ? Neuropathy, diabetic ([place])   ? Pneumonia   ? Sinus complaint   ? Sleep apnea   ? CPAP  ? Thalassemia minor 1992  ? ?Past Surgical History:  ?Procedure Laterality Date  ? ABDOMINAL HYSTERECTOMY    ? BREAST BIOPSY Right 02/15/2018  ? [place] bx 6-6:30 ribbon shape, ONE CORE FRAGMENT WITH FIBROSIS. ONE CORE FRAGMENT WITH PORTION OF A DILATED  ? BREAST BIOPSY Right 02/15/2018  ? [place] bx 9:00 heart shape, USUAL DUCTAL HYPERPLASIA  ? BREAST LUMPECTOMY Right 03/09/2018  ? Procedure: BREAST LUMPECTOMY x 2;  Surgeon: Benjamine Sprague, DO;  Location: [facility] ORS;  Service: General;  Laterality: Right;  ? CATARACT EXTRACTION W/PHACO Left 06/11/2016  ? Procedure: CATARACT EXTRACTION PHACO AND INTRAOCULAR LENS PLACEMENT (IOC);  Surgeon: Estill Cotta, MD;  Location: [facility] ORS;  Service: Ophthalmology;  Laterality: Left;  Lot # [id number] ?US:01:38.6 ?AP%:26.4 ?CDE:44.15  ? CATARACT EXTRACTION W/PHACO Right 06/15/2018  ? Procedure: CATARACT  EXTRACTION PHACO AND INTRAOCULAR LENS PLACEMENT (IOC);  Surgeon: Birder Robson, MD;  Location: [facility] ORS;  Service: Ophthalmology;  Laterality: Right;  [place] 00:38.2 ?CDE 4.23 ?Fluid Pack Lot # [id number]  ? COLONOSCOPIES    ? COLONOSCOPY WITH PROPOFOL N/A 10/10/2020  ? Procedure: COLONOSCOPY WITH PROPOFOL;  Surgeon: Lesly Rubenstein, MD;  Location: [facility] ENDOSCOPY;  Service: Endoscopy;  Laterality: N/A;  ? COLONOSCOPY WITH PROPOFOL N/A 11/20/2020  ? Procedure: COLONOSCOPY WITH PROPOFOL;  Surgeon: Lesly Rubenstein, MD;  Location[facility] ENDOSCOPY;  Service: Endoscopy;  Laterality: N/A;  DM ?STAT CBC, BMP ?COVID POSITIVE 09/02/2020  ? CTR    ? ESOPHAGOGASTRODUODENOSCOPY (EGD) WITH PROPOFOL N/A 10/10/2020  ? Procedure: ESOPHAGOGASTRODUODENOSCOPY (EGD) WITH PROPOFOL;  Surgeon: Lesly Rubenstein, MD;  Location: [facility] ENDOSCOPY;  Service: Endoscopy;  Laterality: N/A;  COVID POSITIVE 10/08/2020  ? IR PARACENTESIS  12/17/2021  ? JOINT REPLACEMENT    ? KNEE SURGERY Right 09/24/2017  ? plates and pins  ? TOTAL SHOULDER ARTHROPLASTY Right 09/27/2015  ? ?Patient Active Problem List  ? Diagnosis Date Noted  ? Iron deficiency anemia due to chronic blood loss 02/13/2021  ? History of uterine cancer 02/13/2021  ? Decompensated hepatic cirrhosis ([place]) 10/25/2020  ? OSA (obstructive sleep apnea) 10/25/2020  ? Glossitis 10/25/2020  ?  Grade IV internal hemorrhoids   ? Acute blood loss anemia 10/08/2020  ? Hyponatremia 10/08/2020  ? Rectal bleeding 10/08/2020  ? Acute respiratory failure with hypoxia ([place]) 09/10/2020  ? Pneumonia due to COVID-19 virus 09/08/2020  ? Hip fracture, right ([place]) 09/04/2020  ? Closed right hip fracture ([place]) 09/02/2020  ? Diabetes mellitus with hyperglycemia ([place]) 09/02/2020  ? Hypertension   ? Thalassemia minor   ? Pancytopenia ([place])   ? HLD (hyperlipidemia)   ? Acute renal failure superimposed on stage 3a chronic kidney disease ([place])   ? Depression   ? Fall at home, initial encounter   ? Nondisplaced fracture of  greater trochanter of right femur, initial encounter for closed fracture ([place])   ? ? ?REFERRING DIAG: PT eval/tx for gait difficulty, R26.9  ? ?THERAPY DIAG:  ?Difficulty in walking, not elsewhere classified ? ?Muscle weakness (generalized) ? ?Other abnormalities of gait and mobility ? ?PERTINENT HISTORY: Trying to get on liver transplant list, DM ?R shoulder THA Feb 2019 ?R knee pain ([place])  ? ?PRECAUTIONS: Fall ? ?SUBJECTIVE: Pt states she most sits and rests when she's not coming here or going to a DR appt.  Currently 5/10 pain in LE's and feels like "My skin hurts, like it's going to split open..its just sore". Continued difficulty with walking. Continued swelling in LE's but only able to take off 8L with paracentesis each week.  ? ?PAIN:  ?Are you having pain? Yes: NPRS scale: 5/10 ?Pain location: LEs ?Pain description: sore ?Aggravating factors: swelling, bending knee, touch  ?Relieving factors: "not a lot", elevating feet  ? ?OBJECTIVE:  ?  ?COGNITION: ?          Overall cognitive status: Within functional limits for tasks assessed               ?           ?SENSATION: ?Light touch: hypersensitive to touch ?  ?POSTURE:  ?Forward flexed at the trunk with standing and walking; noted distended abdomen and swelling in legs.  ?  ?PALPATION: ?Tightness, swelling noted bilateral lower extremities ?  ?LE ROM: ?  ?Active ROM Right ?12/09/2021 Left ?12/09/2021  ?Hip flexion      ?Hip extension      ?Hip abduction      ?Hip adduction      ?Hip internal rotation      ?Hip external rotation      ?Knee flexion      ?Knee extension      ?Ankle dorsiflexion limited limited  ?Ankle plantarflexion      ?Ankle inversion      ?Ankle eversion      ? (Blank rows = not tested) ?  ?LE MMT: ?  ?MMT Right ?12/09/2021 Left ?12/09/2021  ?Hip flexion 4- 4-  ?Hip extension      ?Hip abduction      ?Hip adduction      ?Hip internal rotation      ?Hip external rotation      ?Knee flexion 4 4-  ?Knee extension 4+ 4  ?Ankle dorsiflexion 2+ 2+   ?Ankle plantarflexion      ?Ankle inversion      ?Ankle eversion      ? (Blank rows = not tested) ?  ?BED MOBILITY: ?Needs moderate Assistance with legs with sit to supine and moderate assistance with trunk for supine to sit ?  ?FUNCTIONAL TESTS:  ?2 MWT 138 ft with RW ?5 x  sit to stand 31 sec  with Ue's to assist up to stand  ?  ? ?GAIT: ?Distance walked: 138 ft ?Assistive device utilized: Environmental consultant - 2 wheeled ?Level of assistance: SBA ?Comments: mildly SOB after walking ?  ?  ?  ?TODAY'S TREATMENT: ?12/19/21 ?Nustep 5 minutes UE/LE seat 10 ?Standing Heel raises 2X10 with UE assist ? Forward step ups with 4" and 1 UE assist 10X each side ? Lateral step ups 4" with 1 UE assist 10X each ? Forward lunges onto 4" step no UE assist 10X ?Seated Toe raises 2X10 ?   ? ? ?12/16/21 ?Bridge 2 x 10 ?SLR x10 each  ?Heel raise 2 x 10  ?Step ups 4 inch x10 each  ?Nustep (seat 12) 5 min EOS for strength and conditioning lv 1  ?  ?PATIENT EDUCATION:  ?Education details: HEP instruction, disease progression, balance impairments 12/16/21 on therapy goals, exercise form and function  ?Person educated: Patient ?Education method: Explanation, Demonstration, and Handouts ?Education comprehension: verbalized understanding, returned demonstration, and needs further education ?  ?  ?HOME EXERCISE PROGRAM: ?Access Code: [email or web address] ?URL: [email or web address] ?Date: 12/09/2021 ?Prepared by: [facility] - Rehab ?  ?Exercises ?- Seated Heel Toe Raises  - 2 x daily - 7 x weekly - 1 sets - 10 reps ?- Seated Long Arc Quad  - 2 x daily - 7 x weekly - 1 sets - 10 reps ?- Seated March  - 2 x daily - 7 x weekly - 1 sets - 10 reps ?- Supine Active Straight Leg Raise  - 7 x weekly - 1 sets - 10 reps ?  ?ASSESSMENT: ?  ?CLINICAL IMPRESSION: ?Began session with nustep to warm up mm.  Progressed on to standing exercises.  Attempted toe raises but unable to do in standing due to weakness and substitution.  Continued with forward step ups and added lateral ones  and lunges to also work on LE strengthening.  Rt LE noted weaker than Lt with activities. Patient with good tolerance overall, but is limited by fatigue. Patient will continue to benefit from skilled therap

## 2021-12-24 ENCOUNTER — Ambulatory Visit
Admission: RE | Admit: 2021-12-24 | Discharge: 2021-12-24 | Disposition: A | Discharge status: Home / Self Care | Payer: 59 | Source: Ambulatory Visit | Attending: Gastroenterology | Admitting: Gastroenterology

## 2021-12-24 ENCOUNTER — Other Ambulatory Visit: Payer: Self-pay | Admitting: Gastroenterology

## 2021-12-24 DIAGNOSIS — K7460 Unspecified cirrhosis of liver: Secondary | ICD-10-CM | POA: Diagnosis present

## 2021-12-24 DIAGNOSIS — R188 Other ascites: Secondary | ICD-10-CM | POA: Diagnosis not present

## 2021-12-24 DIAGNOSIS — K7581 Nonalcoholic steatohepatitis (NASH): Secondary | ICD-10-CM | POA: Diagnosis present

## 2021-12-24 DIAGNOSIS — K7469 Other cirrhosis of liver: Secondary | ICD-10-CM | POA: Diagnosis not present

## 2021-12-24 MED ORDER — ALBUMIN HUMAN 25 % IV SOLN: 25.0000 g | Freq: Once | INTRAVENOUS | Status: AC

## 2021-12-24 MED ORDER — ALBUMIN HUMAN 25 % IV SOLN
INTRAVENOUS | Status: AC
  Filled 2021-12-24: qty 100

## 2021-12-24 MED ORDER — ALBUMIN HUMAN 25 % IV SOLN
INTRAVENOUS | Status: AC
  Administered 2021-12-24: 25 g via INTRAVENOUS
  Filled 2021-12-24: qty 100

## 2021-12-24 NOTE — Procedures (Addendum)
PROCEDURE SUMMARY: ? ?Successful ultrasound guided paracentesis from the right  lower quadrant.  ?Yielded 7.3 L of straw colored fluid.  ?No immediate complications.  ?The patient tolerated the procedure well.  ? ?EBL < 22m ? ?[facility], and deemed not a candidate for intervention.  - [facility]. Scheduled for TIPS on 5.11.23 ? ? ? ?

## 2021-12-25 ENCOUNTER — Encounter (HOSPITAL_COMMUNITY): Payer: Self-pay

## 2021-12-25 ENCOUNTER — Ambulatory Visit (HOSPITAL_COMMUNITY): Payer: 59

## 2021-12-25 DIAGNOSIS — R2689 Other abnormalities of gait and mobility: Secondary | ICD-10-CM | POA: Diagnosis not present

## 2021-12-25 DIAGNOSIS — E559 Vitamin D deficiency, unspecified: Secondary | ICD-10-CM | POA: Diagnosis not present

## 2021-12-25 DIAGNOSIS — E538 Deficiency of other specified B group vitamins: Secondary | ICD-10-CM | POA: Diagnosis not present

## 2021-12-25 DIAGNOSIS — D563 Thalassemia minor: Secondary | ICD-10-CM | POA: Diagnosis not present

## 2021-12-25 DIAGNOSIS — M6281 Muscle weakness (generalized): Secondary | ICD-10-CM | POA: Diagnosis not present

## 2021-12-25 DIAGNOSIS — R262 Difficulty in walking, not elsewhere classified: Secondary | ICD-10-CM

## 2021-12-25 DIAGNOSIS — Z8542 Personal history of malignant neoplasm of other parts of uterus: Secondary | ICD-10-CM | POA: Diagnosis not present

## 2021-12-25 DIAGNOSIS — Z8051 Family history of malignant neoplasm of kidney: Secondary | ICD-10-CM | POA: Diagnosis not present

## 2021-12-25 DIAGNOSIS — E119 Type 2 diabetes mellitus without complications: Secondary | ICD-10-CM | POA: Diagnosis not present

## 2021-12-25 DIAGNOSIS — K7460 Unspecified cirrhosis of liver: Secondary | ICD-10-CM | POA: Diagnosis not present

## 2021-12-25 DIAGNOSIS — D500 Iron deficiency anemia secondary to blood loss (chronic): Secondary | ICD-10-CM | POA: Diagnosis not present

## 2021-12-25 DIAGNOSIS — Z803 Family history of malignant neoplasm of breast: Secondary | ICD-10-CM | POA: Diagnosis not present

## 2021-12-25 DIAGNOSIS — Z807 Family history of other malignant neoplasms of lymphoid, hematopoietic and related tissues: Secondary | ICD-10-CM | POA: Diagnosis not present

## 2021-12-25 DIAGNOSIS — Z90710 Acquired absence of both cervix and uterus: Secondary | ICD-10-CM | POA: Diagnosis not present

## 2021-12-25 NOTE — Therapy (Signed)
?OUTPATIENT PHYSICAL THERAPY TREATMENT NOTE ? ? ?Patient Name: Rachel STALLSMITH ?MRN: [id number] ?DOB:06-Feb-1956, 66 y.o., female ?Today's Date: 12/25/2021 ? ?PCP: Frazier Richards MD  ?REFERRING PROVIDER: Eulis Canner, MD  ? ?END OF SESSION:  ? PT End of Session - 12/25/21 1156   ? ? Visit Number 4   ? Number of Visits 6   ? Date for PT Re-Evaluation 01/08/22   ? Authorization Type [employer]; 35 visit limit   ? Authorization - Visit Number 4   ? Authorization - Number of Visits 35   ? PT Start Time 0086   ? PT Stop Time 1213   ? PT Time Calculation (min) 42 min   ? Activity Tolerance Patient tolerated treatment well;Patient limited by fatigue   ? Behavior During Therapy [facility] for tasks assessed/performed   ? ?  ?  ? ?  ? ? ? ?Past Medical History:  ?Diagnosis Date  ? Anxiety   ? Arthritis   ? Cirrhosis of liver ([place])   ? Diabetes mellitus without complication ([place])   ? Dyspnea   ? DOE  ? GERD (gastroesophageal reflux disease)   ? Hepatitis   ? PAST  ? Hypertension   ? Neuropathy   ? Neuropathy, diabetic ([place])   ? Pneumonia   ? Sinus complaint   ? Sleep apnea   ? CPAP  ? Thalassemia minor 1992  ? ?Past Surgical History:  ?Procedure Laterality Date  ? ABDOMINAL HYSTERECTOMY    ? BREAST BIOPSY Right 02/15/2018  ? [place] bx 6-6:30 ribbon shape, ONE CORE FRAGMENT WITH FIBROSIS. ONE CORE FRAGMENT WITH PORTION OF A DILATED  ? BREAST BIOPSY Right 02/15/2018  ? [place] bx 9:00 heart shape, USUAL DUCTAL HYPERPLASIA  ? BREAST LUMPECTOMY Right 03/09/2018  ? Procedure: BREAST LUMPECTOMY x 2;  Surgeon: Benjamine Sprague, DO;  Location: [facility] ORS;  Service: General;  Laterality: Right;  ? CATARACT EXTRACTION W/PHACO Left 06/11/2016  ? Procedure: CATARACT EXTRACTION PHACO AND INTRAOCULAR LENS PLACEMENT (IOC);  Surgeon: Estill Cotta, MD;  Location: [facility] ORS;  Service: Ophthalmology;  Laterality: Left;  Lot # [id number] ?US:01:38.6 ?AP%:26.4 ?CDE:44.15  ? CATARACT EXTRACTION W/PHACO Right 06/15/2018  ? Procedure: CATARACT  EXTRACTION PHACO AND INTRAOCULAR LENS PLACEMENT (IOC);  Surgeon: Birder Robson, MD;  Location: [facility] ORS;  Service: Ophthalmology;  Laterality: Right;  [place] 00:38.2 ?CDE 4.23 ?Fluid Pack Lot # [id number]  ? COLONOSCOPIES    ? COLONOSCOPY WITH PROPOFOL N/A 10/10/2020  ? Procedure: COLONOSCOPY WITH PROPOFOL;  Surgeon: Lesly Rubenstein, MD;  Location: [facility] ENDOSCOPY;  Service: Endoscopy;  Laterality: N/A;  ? COLONOSCOPY WITH PROPOFOL N/A 11/20/2020  ? Procedure: COLONOSCOPY WITH PROPOFOL;  Surgeon: Lesly Rubenstein, MD;  Location: [facility] ENDOSCOPY;  Service: Endoscopy;  Laterality: N/A;  DM ?STAT CBC, BMP ?COVID POSITIVE 09/02/2020  ? CTR    ? ESOPHAGOGASTRODUODENOSCOPY (EGD) WITH PROPOFOL N/A 10/10/2020  ? Procedure: ESOPHAGOGASTRODUODENOSCOPY (EGD) WITH PROPOFOL;  Surgeon: Lesly Rubenstein, MD;  Location: [facility] ENDOSCOPY;  Service: Endoscopy;  Laterality: N/A;  COVID POSITIVE 10/08/2020  ? IR PARACENTESIS  12/17/2021  ? JOINT REPLACEMENT    ? KNEE SURGERY Right 09/24/2017  ? plates and pins  ? TOTAL SHOULDER ARTHROPLASTY Right 09/27/2015  ? ?Patient Active Problem List  ? Diagnosis Date Noted  ? Iron deficiency anemia due to chronic blood loss 02/13/2021  ? History of uterine cancer 02/13/2021  ? Decompensated hepatic cirrhosis ([place]) 10/25/2020  ? OSA (obstructive sleep apnea) 10/25/2020  ? Glossitis 10/25/2020  ?  Grade IV internal hemorrhoids   ? Acute blood loss anemia 10/08/2020  ? Hyponatremia 10/08/2020  ? Rectal bleeding 10/08/2020  ? Acute respiratory failure with hypoxia ([place]) 09/10/2020  ? Pneumonia due to COVID-19 virus 09/08/2020  ? Hip fracture, right ([place]) 09/04/2020  ? Closed right hip fracture ([place]) 09/02/2020  ? Diabetes mellitus with hyperglycemia ([place]) 09/02/2020  ? Hypertension   ? Thalassemia minor   ? Pancytopenia ([place])   ? HLD (hyperlipidemia)   ? Acute renal failure superimposed on stage 3a chronic kidney disease ([place])   ? Depression   ? Fall at home, initial encounter   ? Nondisplaced fracture of  greater trochanter of right femur, initial encounter for closed fracture ([place])   ? ? ?REFERRING DIAG: PT eval/tx for gait difficulty, R26.9  ? ?THERAPY DIAG:  ?Difficulty in walking, not elsewhere classified ? ?Muscle weakness (generalized) ? ?Other abnormalities of gait and mobility ?PERTINENT HISTORY: Trying to get on liver transplant list, DM ?R shoulder THA Feb 2019 ?R knee pain (OA)  ? ?PRECAUTIONS: Fall ? ?SUBJECTIVE: Pt reports increased comfort, had paracentesis procedure yesterday, stated it helped with pain and ability to walk. Her skin continues to hurt, tender to the touch.  Having TIPS procedure scheduled for tomorrow. ? ?PAIN:  ?Are you having pain? Yes: NPRS scale: 4 /10 ?Pain location: LE skin ?Pain description: sore, tender to touch ?Aggravating factors: swelling, bending knee, touch  ?Relieving factors: "not a lot", elevating feet  ? ?OBJECTIVE:  ?  ?COGNITION: ?          Overall cognitive status: Within functional limits for tasks assessed               ?           ?SENSATION: ?Light touch: hypersensitive to touch ?  ?POSTURE:  ?Forward flexed at the trunk with standing and walking; noted distended abdomen and swelling in legs.  ?  ?PALPATION: ?Tightness, swelling noted bilateral lower extremities ?  ?LE ROM: ?  ?Active ROM Right ?12/09/2021 Left ?12/09/2021  ?Hip flexion      ?Hip extension      ?Hip abduction      ?Hip adduction      ?Hip internal rotation      ?Hip external rotation      ?Knee flexion      ?Knee extension      ?Ankle dorsiflexion limited limited  ?Ankle plantarflexion      ?Ankle inversion      ?Ankle eversion      ? (Blank rows = not tested) ?  ?LE MMT: ?  ?MMT Right ?12/09/2021 Left ?12/09/2021  ?Hip flexion 4- 4-  ?Hip extension      ?Hip abduction      ?Hip adduction      ?Hip internal rotation      ?Hip external rotation      ?Knee flexion 4 4-  ?Knee extension 4+ 4  ?Ankle dorsiflexion 2+ 2+  ?Ankle plantarflexion      ?Ankle inversion      ?Ankle eversion      ? (Blank rows  = not tested) ?  ?BED MOBILITY: ?Needs moderate Assistance with legs with sit to supine and moderate assistance with trunk for supine to sit ?  ?FUNCTIONAL TESTS:  ?2 MWT 138 ft with RW ?5 x  sit to stand 31 sec with Ue's to assist up to stand  ?  ? ?GAIT: ?Distance walked: 138 ft ?Assistive device utilized: Environmental consultant -  2 wheeled ?Level of assistance: SBA ?Comments: mildly SOB after walking ?  ?  ?  ?TODAY'S TREATMENT: ?5/10: ?Nustep 5 minutes UE/LE seat 10 ?2MWT 228 with RW ?Standing Heel raises 2X10 with UE assist ? Forward step ups with 4" and 1 UE assist 10X each side ? March alternating with HHA ? Squat front of chair 10x  ? Sidestep inside // bars no HHA 4RT with GTB around thigh ? Tandem stance 2x 30" ?STS elevated height ?Discussed log rolling for bed mobility, not complete this session ? ?12/19/21 ?Nustep 5 minutes UE/LE seat 10 ?Standing Heel raises 2X10 with UE assist ? Forward step ups with 4" and 1 UE assist 10X each side ? Lateral step ups 4" with 1 UE assist 10X each ? Forward lunges onto 4" step no UE assist 10X ?Seated Toe raises 2X10 ?   ? ? ?12/16/21 ?Bridge 2 x 10 ?SLR x10 each  ?Heel raise 2 x 10  ?Step ups 4 inch x10 each  ?Nustep (seat 12) 5 min EOS for strength and conditioning lv 1  ?  ?PATIENT EDUCATION:  ?Education details: HEP instruction, disease progression, balance impairments 12/16/21 on therapy goals, exercise form and function  ?Person educated: Patient ?Education method: Explanation, Demonstration, and Handouts ?Education comprehension: verbalized understanding, returned demonstration, and needs further education ?  ?  ?HOME EXERCISE PROGRAM: ?Access Code: [email or web address] ?URL: [email or web address] ?Date: 12/09/2021 ?Prepared by: AP - Rehab ?  ?Exercises ?- Seated Heel Toe Raises  - 2 x daily - 7 x weekly - 1 sets - 10 reps ?- Seated Long Arc Quad  - 2 x daily - 7 x weekly - 1 sets - 10 reps ?- Seated March  - 2 x daily - 7 x weekly - 1 sets - 10 reps ?- Supine Active Straight Leg  Raise  - 7 x weekly - 1 sets - 10 reps ?  ?ASSESSMENT: ?  ?CLINICAL IMPRESSION: ?Continued session focus with LE strengthening and additional balance training.  Added marching, tandem stance and side

## 2021-12-26 DIAGNOSIS — R188 Other ascites: Secondary | ICD-10-CM | POA: Diagnosis not present

## 2021-12-26 DIAGNOSIS — K766 Portal hypertension: Secondary | ICD-10-CM | POA: Diagnosis not present

## 2021-12-30 ENCOUNTER — Ambulatory Visit (HOSPITAL_COMMUNITY): Payer: 59

## 2021-12-31 ENCOUNTER — Ambulatory Visit
Admission: RE | Admit: 2021-12-31 | Discharge: 2021-12-31 | Disposition: A | Discharge status: Home / Self Care | Payer: 59 | Source: Ambulatory Visit | Attending: Gastroenterology | Admitting: Gastroenterology

## 2021-12-31 DIAGNOSIS — R188 Other ascites: Secondary | ICD-10-CM | POA: Diagnosis not present

## 2021-12-31 DIAGNOSIS — K7581 Nonalcoholic steatohepatitis (NASH): Secondary | ICD-10-CM | POA: Insufficient documentation

## 2021-12-31 DIAGNOSIS — K7460 Unspecified cirrhosis of liver: Secondary | ICD-10-CM | POA: Diagnosis not present

## 2021-12-31 MED ORDER — ALBUMIN HUMAN 25 % IV SOLN
INTRAVENOUS | Status: AC
  Administered 2021-12-31: 25 g via INTRAVENOUS
  Filled 2021-12-31: qty 200

## 2021-12-31 MED ORDER — ALBUMIN HUMAN 25 % IV SOLN: 25.0000 g | Freq: Once | INTRAVENOUS | Status: AC

## 2021-12-31 MED ORDER — ALBUMIN HUMAN 25 % IV SOLN
25.0000 g | Freq: Once | INTRAVENOUS | Status: AC
Start: 2021-12-31 — End: 2021-12-31
  Administered 2021-12-31: 25 g via INTRAVENOUS

## 2021-12-31 NOTE — Procedures (Signed)
PROCEDURE SUMMARY: ? ?Successful US guided paracentesis from LLQ ?Yielded 8 L of clear yellow fluid.  ?No immediate complications.  ?Pt tolerated well.  ? ?Specimen was sent for labs. ? ?EBL < 76m ? ?MTsosie BillingD PA-C ?12/31/2021 ?12:56 PM ? ? ? ?

## 2022-01-02 ENCOUNTER — Encounter (HOSPITAL_COMMUNITY): Payer: Self-pay | Admitting: Physical Therapy

## 2022-01-02 ENCOUNTER — Ambulatory Visit (HOSPITAL_COMMUNITY): Payer: 59 | Admitting: Physical Therapy

## 2022-01-02 DIAGNOSIS — Z8051 Family history of malignant neoplasm of kidney: Secondary | ICD-10-CM | POA: Diagnosis not present

## 2022-01-02 DIAGNOSIS — D500 Iron deficiency anemia secondary to blood loss (chronic): Secondary | ICD-10-CM | POA: Diagnosis not present

## 2022-01-02 DIAGNOSIS — E119 Type 2 diabetes mellitus without complications: Secondary | ICD-10-CM | POA: Diagnosis not present

## 2022-01-02 DIAGNOSIS — Z807 Family history of other malignant neoplasms of lymphoid, hematopoietic and related tissues: Secondary | ICD-10-CM | POA: Diagnosis not present

## 2022-01-02 DIAGNOSIS — R262 Difficulty in walking, not elsewhere classified: Secondary | ICD-10-CM

## 2022-01-02 DIAGNOSIS — E538 Deficiency of other specified B group vitamins: Secondary | ICD-10-CM | POA: Diagnosis not present

## 2022-01-02 DIAGNOSIS — Z803 Family history of malignant neoplasm of breast: Secondary | ICD-10-CM | POA: Diagnosis not present

## 2022-01-02 DIAGNOSIS — K7460 Unspecified cirrhosis of liver: Secondary | ICD-10-CM | POA: Diagnosis not present

## 2022-01-02 DIAGNOSIS — M6281 Muscle weakness (generalized): Secondary | ICD-10-CM | POA: Diagnosis not present

## 2022-01-02 DIAGNOSIS — E559 Vitamin D deficiency, unspecified: Secondary | ICD-10-CM | POA: Diagnosis not present

## 2022-01-02 DIAGNOSIS — Z8542 Personal history of malignant neoplasm of other parts of uterus: Secondary | ICD-10-CM | POA: Diagnosis not present

## 2022-01-02 DIAGNOSIS — R2689 Other abnormalities of gait and mobility: Secondary | ICD-10-CM

## 2022-01-02 DIAGNOSIS — D563 Thalassemia minor: Secondary | ICD-10-CM | POA: Diagnosis not present

## 2022-01-02 DIAGNOSIS — Z90710 Acquired absence of both cervix and uterus: Secondary | ICD-10-CM | POA: Diagnosis not present

## 2022-01-02 NOTE — Therapy (Signed)
OUTPATIENT PHYSICAL THERAPY TREATMENT NOTE   Patient Name: Rachel Huber MRN: [id number] DOB:10/15/1955, 66 y.o., female Today's Date: 01/02/2022  PCP: Frazier Richards MD  REFERRING PROVIDER: Eulis Canner, MD   END OF SESSION:   PT End of Session - 01/02/22 1450     Visit Number 5    Number of Visits 6    Date for PT Re-Evaluation 01/08/22    Authorization Type A[employer]na [place]; 35 visit limit    Authorization - Visit Number 5    Authorization - Number of Visits 61    PT Start Time 1450   late   PT Stop Time 1520    PT Time Calculation (min) 30 min    Activity Tolerance Patient tolerated treatment well;Patient limited by fatigue    Behavior During Therapy WFL for tasks assessed/performed              Past Medical History:  Diagnosis Date   Anxiety    Arthritis    Cirrhosis of liver ([place])    Diabetes mellitus without complication ([place])    Dyspnea    DOE   GERD (gastroesophageal reflux disease)    Hepatitis    PAST   Hypertension    Neuropathy    Neuropathy, diabetic ([place])    Pneumonia    Sinus complaint    Sleep apnea    CPAP   Thalassemia minor 1992   Past Surgical History:  Procedure Laterality Date   ABDOMINAL HYSTERECTOMY     BREAST BIOPSY Right 02/15/2018   [place] bx 6-6:30 ribbon shape, ONE CORE FRAGMENT WITH FIBROSIS. ONE CORE FRAGMENT WITH PORTION OF A DILATED   BREAST BIOPSY Right 02/15/2018   [place] bx 9:00 heart shape, USUAL DUCTAL HYPERPLASIA   BREAST LUMPECTOMY Right 03/09/2018   Procedure: BREAST LUMPECTOMY x 2;  Surgeon: Benjamine Sprague, DO;  Location: [facility] ORS;  Service: General;  Laterality: Right;   CATARACT EXTRACTION W/PHACO Left 06/11/2016   Procedure: CATARACT EXTRACTION PHACO AND INTRAOCULAR LENS PLACEMENT (IOC);  Surgeon: Estill Cotta, MD;  Location: [facility] OR[facility];  Service: Ophthalmology;  Laterality: Left;  Lot # [id number] H US:01:38.6 AP%:26.4 CDE:44.15   CATARACT EXTRACTION W/PHACO Right 06/15/2018   Procedure:  CATARACT EXTRACTION PHACO AND INTRAOCULAR LENS PLACEMENT (IOC);  Surgeon: Birder Robson, MD;  Location: [facility] ORS;  Service: Ophthalmology;  Laterality: Right;  [place] 00:38.2 CDE 4.23 Fluid Pack Lot # [id number]   COLONOSCOPIES     COLONOSCOPY WITH PROPOFOL N/A 10/10/2020   Procedure: COLONOSCOPY WITH PROPOFOL;  Surgeon: Lesly Rubenstein, MD;  Location[facility]OSCOPY;  Service: Endoscopy;  Laterality: N/A;   COLONOSCOPY WITH PROPOFOL N/A 11/20/2020   Procedure: COLONOSCOPY WITH PROPOFOL;  Surgeon: Lesly Rubenstein, MD;  Location: [facility] ENDOSCOPY;  Service: Endoscopy;  Laterality: N/A;  DM STAT CBC, BMP COVID POSITIVE 09/02/2020   CTR     ESOPHAGOGASTRODUODENOSCOPY (EGD) WITH PROPOFOL N/A 10/10/2020   Procedure: ESOPHAGOGASTRODUODENOSCOPY (EGD) WITH PROPOFOL;  Surgeon: Lesly Rubenstein, MD;  Location: [facility] ENDOSCOPY;  Service: Endoscopy;  Laterality: N/A;  COVID POSITIVE 10/08/2020   IR PARACENTESIS  12/17/2021   JOINT REPLACEMENT     KNEE SURGERY Right 09/24/2017   plates and pins   TOTAL SHOULDER ARTHROPLASTY Right 09/27/2015   Patient Active Problem List   Diagnosis Date Noted   Iron deficiency anemia due to chronic blood loss 02/13/2021   History of uterine cancer 02/13/2021   Decompensated hepatic cirrhosis ([place]) 10/25/2020   OSA (obstructive sleep apnea) 10/25/2020   Glossitis  10/25/2020   Grade IV internal hemorrhoids    Acute blood loss anemia 10/08/2020   Hyponatremia 10/08/2020   Rectal bleeding 10/08/2020   Acute respiratory failure with hypoxia ([place]) 09/10/2020   Pneumonia due to COVID-19 virus 09/08/2020   Hip fracture, right ([place]) 09/04/2020   Closed right hip fracture ([place]) 09/02/2020   Diabetes mellitus with hyperglycemia ([place]) 09/02/2020   Hypertension    Thalassemia minor    Pancytopenia ([place])    HLD (hyperlipidemia)    Acute renal failure superimposed on stage 3a chronic kidney disease ([place])    Depression    Fall at home, initial encounter    Nondisplaced  fracture of greater trochanter of right femur, initial encounter for closed fracture ([place])     REFERRING DIAG: PT eval/tx for gait difficulty, R26.9   THERAPY DIAG:  Difficulty in walking, not elsewhere classified  Muscle weakness (generalized)  Other abnormalities of gait and mobility PERTINENT HISTORY: Trying to get on liver transplant list, DM R shoulder THA Feb 2019 R knee pain (OA)   PRECAUTIONS: Fall  SUBJECTIVE: Doing ok, getting around better after TIPS procedure. Back is sore today, about a 4. PAIN:  Are you having pain? Yes: NPRS scale: 4 /10 Pain location: Low back Pain description: sore, aching Aggravating factors: WB Relieving factors: non WB   OBJECTIVE:    COGNITION:           Overall cognitive status: Within functional limits for tasks assessed                          SENSATION: Light touch: hypersensitive to touch   POSTURE:  Forward flexed at the trunk with standing and walking; noted distended abdomen and swelling in legs.    PALPATION: Tightness, swelling noted bilateral lower extremities   LE ROM:   Active ROM Right 12/09/2021 Left 12/09/2021  Hip flexion      Hip extension      Hip abduction      Hip adduction      Hip internal rotation      Hip external rotation      Knee flexion      Knee extension      Ankle dorsiflexion limited limited  Ankle plantarflexion      Ankle inversion      Ankle eversion       (Blank rows = not tested)   LE MMT:   MMT Right 12/09/2021 Left 12/09/2021  Hip flexion 4- 4-  Hip extension      Hip abduction      Hip adduction      Hip internal rotation      Hip external rotation      Knee flexion 4 4-  Knee extension 4+ 4  Ankle dorsiflexion 2+ 2+  Ankle plantarflexion      Ankle inversion      Ankle eversion       (Blank rows = not tested)   BED MOBILITY: Needs moderate Assistance with legs with sit to supine and moderate assistance with trunk for supine to sit   FUNCTIONAL TESTS:  2 MWT 138  ft with RW 5 x  sit to stand 31 sec with Ue's to assist up to stand     GAIT: Distance walked: 138 ft Assistive device utilized: Walker - 2 wheeled Level of assistance: SBA Comments: mildly SOB after walking       TODAY'S TREATMENT: 01/02/22 Nustep 4 minutes lv3 UE/LE  seat 12 Alt step taps 4 inch  x 20 int HHA Tandem stance x30" solid floor Tandem x 30" on foam  Gait with head turn 30 feet Gait over obstacles 30 feet  5/10: Nustep 5 minutes UE/LE seat 10 2MWT 228 with RW Standing Heel raises 2X10 with UE assist  Forward step ups with 4" and 1 UE assist 10X each side  March alternating with HHA  Squat front of chair 10x   Sidestep inside // bars no HHA 4RT with GTB around thigh  Tandem stance 2x 30" STS elevated height Discussed log rolling for bed mobility, not complete this session  12/19/21 Nustep 5 minutes UE/LE seat 10 Standing Heel raises 2X10 with UE assist  Forward step ups with 4" and 1 UE assist 10X each side  Lateral step ups 4" with 1 UE assist 10X each  Forward lunges onto 4" step no UE assist 10X Seated Toe raises 2X10     PATIENT EDUCATION:  Education details: HEP instruction, disease progression, balance impairments 12/16/21 on therapy goals, exercise form and function  Person educated: Patient Education method: Explanation, Demonstration, and Handouts Education comprehension: verbalized understanding, returned demonstration, and needs further education     HOME EXERCISE PROGRAM: Access Code: [email or web address] URL: [email or web address] Date: 12/09/2021 Prepared by: AP - Rehab   Exercises - Seated Heel Toe Raises  - 2 x daily - 7 x weekly - 1 sets - 10 reps - Seated Long Arc Quad  - 2 x daily - 7 x weekly - 1 sets - 10 reps - Seated March  - 2 x daily - 7 x weekly - 1 sets - 10 reps - Supine Active Straight Leg Raise  - 7 x weekly - 1 sets - 10 reps   ASSESSMENT:   CLINICAL IMPRESSION: Patient tolerated session well. Well challenged with  ground clearance during hurdle ambulation. Required verbal cues for lifting RLE. Shows improved static balance with tandem on solid floor and on foam. Patient will continue to benefit from skilled therapy services to reduce remaining deficits and improve functional ability.     OBJECTIVE IMPAIRMENTS Abnormal gait, decreased activity tolerance, decreased balance, decreased coordination, decreased endurance, decreased mobility, difficulty walking, decreased ROM, decreased strength, decreased safety awareness, hypomobility, increased edema, impaired perceived functional ability, impaired flexibility, impaired sensation, improper body mechanics, and pain.    ACTIVITY LIMITATIONS cleaning, community activity, driving, meal prep, laundry, medication management, yard work, and shopping.    PERSONAL FACTORS Fitness and 1-2 comorbidities: liver cirrhosis and DM  are also affecting patient's functional outcome.      REHAB POTENTIAL: Fair secondary to progressive liver disease   CLINICAL DECISION MAKING: Stable/uncomplicated   EVALUATION COMPLEXITY: Low     GOALS: Goals reviewed with patient? No   SHORT TERM GOALS: Target date: 12/23/2021     Patient with be independent with initial HEP to  improve functional outcomes.   Goal status: Ongoing   2.  Patient will increased her 2MWT to 155 ft to demonstrate improved functional mobility and easier access to her home enviroment Baseline: 138 ft Goal status:  Ongoing     LONG TERM GOALS: Target date: 01/06/2022   Patient will be independent with advanced HEP and self management strategies to improve quality of life and functional outcomes.   Goal status:  Ongoing   2.  Patient will improve her 5 times STS score from 31 sec to 27 sec or less to demonstrate improved functional mobility and increased lower extremity strength Baseline:  31 sec Goal status:  Ongoing   3.  Patient will increased her 2MWT to 185 ft to demonstrate improved functional  mobility and easier access to community Baseline: 138 ft Goal status:  Ongoing   4.  Patient will be able to perform supine to sit and sit to supine modified independently to increase patient independence at home. Baseline: mod assist for supine to sit and sit to supine Goal status:  Ongoing   5.  Patient will report at least 50% improvement in overall symptoms and/or function to demonstrate improved functional mobility   Baseline:  Goal status:  Ongoing       PLAN: PT FREQUENCY: 2x/week   PT DURATION: 2 weeks; then 1 x a week for 2 weeks as patient    PLANNED INTERVENTIONS: Therapeutic exercises, Therapeutic activity, Neuromuscular re-education, Balance training, Gait training, Patient/Family education, Joint manipulation, Joint mobilization, Stair training, Vestibular training, Orthotic/Fit training, DME instructions, Aquatic Therapy, Dry Needling, Electrical stimulation, Spinal manipulation, Spinal mobilization, Cryotherapy, Moist heat, Compression bandaging, Taping, Traction, Ultrasound, Contrast bath, Biofeedback, and Manual therapy   PLAN FOR NEXT SESSION:  Reassess.  2:51 PM, 01/02/22 Josue Hector PT DPT  Physical Therapist with [facility]  [phone]

## 2022-01-03 ENCOUNTER — Encounter (HOSPITAL_COMMUNITY): Payer: Self-pay

## 2022-01-03 ENCOUNTER — Inpatient Hospital Stay (HOSPITAL_COMMUNITY): Payer: 59

## 2022-01-03 VITALS — BP 94/68 | HR 79 | Temp 97.6°F | Resp 18

## 2022-01-03 DIAGNOSIS — D563 Thalassemia minor: Secondary | ICD-10-CM | POA: Diagnosis not present

## 2022-01-03 DIAGNOSIS — Z8051 Family history of malignant neoplasm of kidney: Secondary | ICD-10-CM | POA: Diagnosis not present

## 2022-01-03 DIAGNOSIS — E538 Deficiency of other specified B group vitamins: Secondary | ICD-10-CM | POA: Diagnosis not present

## 2022-01-03 DIAGNOSIS — Z807 Family history of other malignant neoplasms of lymphoid, hematopoietic and related tissues: Secondary | ICD-10-CM | POA: Diagnosis not present

## 2022-01-03 DIAGNOSIS — Z90710 Acquired absence of both cervix and uterus: Secondary | ICD-10-CM | POA: Diagnosis not present

## 2022-01-03 DIAGNOSIS — D500 Iron deficiency anemia secondary to blood loss (chronic): Secondary | ICD-10-CM | POA: Diagnosis not present

## 2022-01-03 DIAGNOSIS — E119 Type 2 diabetes mellitus without complications: Secondary | ICD-10-CM | POA: Diagnosis not present

## 2022-01-03 DIAGNOSIS — R262 Difficulty in walking, not elsewhere classified: Secondary | ICD-10-CM | POA: Diagnosis not present

## 2022-01-03 DIAGNOSIS — Z8542 Personal history of malignant neoplasm of other parts of uterus: Secondary | ICD-10-CM | POA: Diagnosis not present

## 2022-01-03 DIAGNOSIS — M6281 Muscle weakness (generalized): Secondary | ICD-10-CM | POA: Diagnosis not present

## 2022-01-03 DIAGNOSIS — R2689 Other abnormalities of gait and mobility: Secondary | ICD-10-CM | POA: Diagnosis not present

## 2022-01-03 DIAGNOSIS — K7460 Unspecified cirrhosis of liver: Secondary | ICD-10-CM | POA: Diagnosis not present

## 2022-01-03 DIAGNOSIS — Z803 Family history of malignant neoplasm of breast: Secondary | ICD-10-CM | POA: Diagnosis not present

## 2022-01-03 DIAGNOSIS — E559 Vitamin D deficiency, unspecified: Secondary | ICD-10-CM | POA: Diagnosis not present

## 2022-01-03 MED ORDER — SODIUM CHLORIDE 0.9 % IV SOLN: Freq: Once | INTRAVENOUS | Status: AC

## 2022-01-03 MED ORDER — SODIUM CHLORIDE 0.9 % IV SOLN
300.0000 mg | Freq: Once | INTRAVENOUS | Status: AC
  Administered 2022-01-03: 300 mg via INTRAVENOUS
  Filled 2022-01-03: qty 300

## 2022-01-03 MED ORDER — LORATADINE 10 MG PO TABS
10.0000 mg | ORAL_TABLET | Freq: Once | ORAL | Status: AC
  Administered 2022-01-03: 10 mg via ORAL
  Filled 2022-01-03: qty 1

## 2022-01-06 ENCOUNTER — Ambulatory Visit (HOSPITAL_COMMUNITY): Payer: 59

## 2022-01-06 ENCOUNTER — Encounter (HOSPITAL_COMMUNITY): Payer: Self-pay | Admitting: Hematology

## 2022-01-06 DIAGNOSIS — Z90710 Acquired absence of both cervix and uterus: Secondary | ICD-10-CM | POA: Diagnosis not present

## 2022-01-06 DIAGNOSIS — R2689 Other abnormalities of gait and mobility: Secondary | ICD-10-CM

## 2022-01-06 DIAGNOSIS — Z8542 Personal history of malignant neoplasm of other parts of uterus: Secondary | ICD-10-CM | POA: Diagnosis not present

## 2022-01-06 DIAGNOSIS — E559 Vitamin D deficiency, unspecified: Secondary | ICD-10-CM | POA: Diagnosis not present

## 2022-01-06 DIAGNOSIS — M6281 Muscle weakness (generalized): Secondary | ICD-10-CM

## 2022-01-06 DIAGNOSIS — R262 Difficulty in walking, not elsewhere classified: Secondary | ICD-10-CM | POA: Diagnosis not present

## 2022-01-06 DIAGNOSIS — E538 Deficiency of other specified B group vitamins: Secondary | ICD-10-CM | POA: Diagnosis not present

## 2022-01-06 DIAGNOSIS — Z8051 Family history of malignant neoplasm of kidney: Secondary | ICD-10-CM | POA: Diagnosis not present

## 2022-01-06 DIAGNOSIS — E119 Type 2 diabetes mellitus without complications: Secondary | ICD-10-CM | POA: Diagnosis not present

## 2022-01-06 DIAGNOSIS — K7460 Unspecified cirrhosis of liver: Secondary | ICD-10-CM

## 2022-01-06 DIAGNOSIS — D500 Iron deficiency anemia secondary to blood loss (chronic): Secondary | ICD-10-CM | POA: Diagnosis not present

## 2022-01-06 DIAGNOSIS — Z807 Family history of other malignant neoplasms of lymphoid, hematopoietic and related tissues: Secondary | ICD-10-CM | POA: Diagnosis not present

## 2022-01-06 DIAGNOSIS — Z803 Family history of malignant neoplasm of breast: Secondary | ICD-10-CM | POA: Diagnosis not present

## 2022-01-06 DIAGNOSIS — D563 Thalassemia minor: Secondary | ICD-10-CM | POA: Diagnosis not present

## 2022-01-06 NOTE — Therapy (Signed)
OUTPATIENT PHYSICAL THERAPY PROGRESS NOTE  Progress Note Reporting Period 12/09/2021 to 01/06/22  See note below for Objective Data and Assessment of Progress/Goals.       Patient Name: Rachel Huber MRN: [id number] DOB:1955/12/15, 66 y.o., female Today's Date: 01/06/2022  PCP: Frazier Richards MD  REFERRING PROVIDER: Eulis Canner, MD   END OF SESSION:   PT End of Session - 01/06/22 1436     Visit Number 6    Number of Visits 14    Date for PT Re-Evaluation 01/08/22    Authorization Type [employer]; 35 visit limit    Authorization - Visit Number 6    Authorization - Number of Visits 35    PT Start Time 8416    PT Stop Time 1520    PT Time Calculation (min) 44 min    Activity Tolerance Patient tolerated treatment well    Behavior During Therapy WFL for tasks assessed/performed               Past Medical History:  Diagnosis Date   Anxiety    Arthritis    Cirrhosis of liver ([place])    Diabetes mellitus without complication ([place])    Dyspnea    DOE   GERD (gastroesophageal reflux disease)    Hepatitis    PAST   Hypertension    Neuropathy    Neuropathy, diabetic ([place])    Pneumonia    Sinus complaint    Sleep apnea    CPAP   Thalassemia minor 1992   Past Surgical History:  Procedure Laterality Date   ABDOMINAL HYSTERECTOMY     BREAST BIOPSY Right 02/15/2018   [place] bx 6-6:30 ribbon shape, ONE CORE FRAGMENT WITH FIBROSIS. ONE CORE FRAGMENT WITH PORTION OF A DILATED   BREAST BIOPSY Right 02/15/2018   [place] bx 9:00 heart shape, USUAL DUCTAL HYPERPLASIA   BREAST LUMPECTOMY Right 03/09/2018   Procedure: BREAST LUMPECTOMY x 2;  Surgeon: Benjamine Sprague, DO;  Location: [facility];  Service: General;  Laterality: Right;   CATARACT EXTRACTION W/PHACO Left 06/11/2016   Procedure: CATARACT EXTRACTION PHACO AND INTRAOCULAR LENS PLACEMENT (IOC);  Surgeon: Estill Cotta, MD;  Location: [facility];  Service: Ophthalmology;  Laterality: Left;  Lot #  [id number] H US:01:38.6 AP%:26.4 CDE:44.15   CATARACT EXTRACTION W/PHACO Right 06/15/2018   Procedure: CATARACT EXTRACTION PHACO AND INTRAOCULAR LENS PLACEMENT (IOC);  Surgeon: Birder Robson, MD;  Location: [facility];  Service: Ophthalmology;  Laterality: Right;  [place] 00:38.2 CDE 4.23 Fluid Pack Lot # [id number]   COLONOSCOPIES     COLONOSCOPY WITH PROPOFOL N/A 10/10/2020   Procedure: COLONOSCOPY WITH PROPOFOL;  Surgeon: Lesly Rubenstein, MD;  Location: [facility];  Service: Endoscopy;  Laterality: N/A;   COLONOSCOPY WITH PROPOFOL N/A 11/20/2020   Procedure: COLONOSCOPY WITH PROPOFOL;  Surgeon: Lesly Rubenstein, MD;  Location: [facility];  Service: Endoscopy;  Laterality: N/A;  DM STAT CBC, BMP COVID POSITIVE 09/02/2020   CTR     ESOPHAGOGASTRODUODENOSCOPY (EGD) WITH PROPOFOL N/A 10/10/2020   Procedure: ESOPHAGOGASTRODUODENOSCOPY (EGD) WITH PROPOFOL;  Surgeon: Lesly Rubenstein, MD;  Location: [facility] ENDOSCOPY;  Service: Endoscopy;  Laterality: N/A;  COVID POSITIVE 10/08/2020   IR PARACENTESIS  12/17/2021   JOINT REPLACEMENT     KNEE SURGERY Right 09/24/2017   plates and pins   TOTAL SHOULDER ARTHROPLASTY Right 09/27/2015   Patient Active Problem List   Diagnosis Date Noted   Iron deficiency anemia due to chronic blood loss 02/13/2021   History of uterine  cancer 02/13/2021   Decompensated hepatic cirrhosis ([place]) 10/25/2020   OSA (obstructive sleep apnea) 10/25/2020   Glossitis 10/25/2020   Grade IV internal hemorrhoids    Acute blood loss anemia 10/08/2020   Hyponatremia 10/08/2020   Rectal bleeding 10/08/2020   Acute respiratory failure with hypoxia ([place]) 09/10/2020   Pneumonia due to COVID-19 virus 09/08/2020   Hip fracture, right ([place]) 09/04/2020   Closed right hip fracture ([place]) 09/02/2020   Diabetes mellitus with hyperglycemia ([place]) 09/02/2020   Hypertension    Thalassemia minor    Pancytopenia ([place])    HLD (hyperlipidemia)    Acute renal failure superimposed on  stage 3a chronic kidney disease ([place])    Depression    Fall at home, initial encounter    Nondisplaced fracture of greater trochanter of right femur, initial encounter for closed fracture ([place])     REFERRING DIAG: PT eval/tx for gait difficulty, R26.9   THERAPY DIAG:  Difficulty in walking, not elsewhere classified  Cirrhosis, non-alcoholic (HCC)  Muscle weakness (generalized)  Other abnormalities of gait and mobility PERTINENT HISTORY: Trying to get on liver transplant list, DM R shoulder THA Feb 2019 R knee pain ([facility])   PRECAUTIONS: Fall  SUBJECTIVE: Reports overall doing better; having paracentesis tomorrow; holding some fluid today more than at last visit.  Liked balance activities last treatment. About 40-50% better. PAIN:  Are you having pain? Yes: NPRS scale: 4 -5/10 Pain location: Low back and Right hip Pain description: sore, aching Aggravating factors: standing still Relieving factors: non WB   OBJECTIVE:    COGNITION:           Overall cognitive status: Within functional limits for tasks assessed                          SENSATION: Light touch: hypersensitive to touch   POSTURE:  Forward flexed at the trunk with standing and walking; noted distended abdomen and swelling in legs.    PALPATION: Tightness, swelling noted bilateral lower extremities   LE ROM:   Active ROM Right 12/09/2021 Left 12/09/2021 Right 01/06/2022 Left 01/06/2022  Hip flexion        Hip extension        Hip abduction        Hip adduction        Hip internal rotation        Hip external rotation        Knee flexion        Knee extension        Ankle dorsiflexion limited limited wfl wfl  Ankle plantarflexion        Ankle inversion        Ankle eversion         (Blank rows = not tested)   LE MMT:   MMT Right 12/09/2021 Left 12/09/2021 Right 01/06/2022 Left 01/06/2022  Hip flexion 4- 4- 4+ 4+  Hip extension        Hip abduction        Hip adduction        Hip internal  rotation        Hip external rotation        Knee flexion(sitting) 4 4- Sitting 4+ Sitting 4+  Knee extension 4+ 4 4+ 4+  Ankle dorsiflexion 2+ 2+ 4 4  Ankle plantarflexion        Ankle inversion        Ankle eversion         (  Blank rows = not tested)   BED MOBILITY: Needs moderate Assistance with legs with sit to supine and moderate assistance with trunk for supine to sit   FUNCTIONAL TESTS:  2 MWT 138 ft with RW; 01/06/2022 180 ft with SPC 5 x  sit to stand 31 sec with Ue's to assist up to stand, 24 sec using Ue's to assist    GAIT: Distance walked: 138 ft; 01/06/2022 180 ft with [facility] Assistive device utilized: Environmental consultant - 2 wheeled; 01/06/2022 with SPC Level of assistance: SBA Comments: mildly SOB after walking       TODAY'S TREATMENT: 01/06/2022 Progress note Gait: 2MWT 180 ft with SPC and SBA 5 x sit to stand test with Ue's to assist up to stand 24 sec Updated HEP Exercises - Standing Toe Taps  - 2-3 x daily - 7 x weekly - 1 sets - 10 reps - Standing Tandem Balance with Counter Support  - 2-3 x daily - 7 x weekly - 3 sets - 3 reps - 30" hold   01/02/22 Nustep 4 minutes lv3 UE/LE seat 12 Alt step taps 4 inch  x 20 int HHA Tandem stance x30" solid floor Tandem x 30" on foam  Gait with head turn 30 feet Gait over obstacles 30 feet  5/10: Nustep 5 minutes UE/LE seat 10 2MWT 228 with RW Standing Heel raises 2X10 with UE assist  Forward step ups with 4" and 1 UE assist 10X each side  March alternating with HHA  Squat front of chair 10x   Sidestep inside // bars no HHA 4RT with GTB around thigh  Tandem stance 2x 30" STS elevated height Discussed log rolling for bed mobility, not complete this session  12/19/21 Nustep 5 minutes UE/LE seat 10 Standing Heel raises 2X10 with UE assist  Forward step ups with 4" and 1 UE assist 10X each side  Lateral step ups 4" with 1 UE assist 10X each  Forward lunges onto 4" step no UE assist 10X Seated Toe raises 2X10     PATIENT  EDUCATION:  Education details: HEP instruction, disease progression, balance impairments 12/16/21 on therapy goals, exercise form and function  Person educated: Patient Education method: Explanation, Demonstration, and Handouts Education comprehension: verbalized understanding, returned demonstration, and needs further education     HOME EXERCISE PROGRAM: Access Code: [email or web address] URL: [email or web address] Date: 01/06/2022 Prepared by: [facility] - Rehab  Exercises - Standing Toe Taps  - 2-3 x daily - 7 x weekly - 1 sets - 10 reps - Standing Tandem Balance with Counter Support  - 2-3 x daily - 7 x weekly - 3 sets - 3 reps - 30" hold  Access Code: [email or web address] URL: [email or web address] Date: 12/09/2021 Prepared by: [facility] - Rehab   Exercises - Seated Heel Toe Raises  - 2 x daily - 7 x weekly - 1 sets - 10 reps - Seated Long Arc Quad  - 2 x daily - 7 x weekly - 1 sets - 10 reps - Seated March  - 2 x daily - 7 x weekly - 1 sets - 10 reps - Supine Active Straight Leg Raise  - 7 x weekly - 1 sets - 10 reps   ASSESSMENT:   CLINICAL IMPRESSION: Progress note today. Patient has made good progress; has met short term goals and progressing towards long term goals; wants to continue to work on balance.request additional 2 x a week x 4 weeks.  Patient will continue to benefit from skilled therapy services to reduce remaining  deficits and improve functional ability.     OBJECTIVE IMPAIRMENTS Abnormal gait, decreased activity tolerance, decreased balance, decreased coordination, decreased endurance, decreased mobility, difficulty walking, decreased ROM, decreased strength, decreased safety awareness, hypomobility, increased edema, impaired perceived functional ability, impaired flexibility, impaired sensation, improper body mechanics, and pain.    ACTIVITY LIMITATIONS cleaning, community activity, driving, meal prep, laundry, medication management, yard work, and shopping.    PERSONAL  FACTORS Fitness and 1-2 comorbidities: liver cirrhosis and DM  are also affecting patient's functional outcome.      REHAB POTENTIAL: Fair secondary to progressive liver disease   CLINICAL DECISION MAKING: Stable/uncomplicated   EVALUATION COMPLEXITY: Low     GOALS: Goals reviewed with patient? No   SHORT TERM GOALS: Target date: 12/23/2021     Patient with be independent with initial HEP to  improve functional outcomes.   Goal status: met   2.  Patient will increased her 2MWT to 155 ft to demonstrate improved functional mobility and easier access to her home enviroment Baseline: 138 ft, 01/06/2022 180 ft  Goal status:  met     LONG TERM GOALS: Target date: 02/08/2022   Patient will be independent with advanced HEP and self management strategies to improve quality of life and functional outcomes.   Goal status:  Ongoing   2.  Patient will improve her 5 times STS score from 31 sec to 27 sec or less to demonstrate improved functional mobility and increased lower extremity strength Baseline: 31 sec, 01/06/2022 24 sec Goal status:  met   3.  Patient will increased her 2MWT to 185 ft to demonstrate improved functional mobility and easier access to community Baseline: 138 ft, 01/06/2022 180 ft with SPC Goal status:  Ongoing   4.  Patient will be able to perform supine to sit and sit to supine modified independently to increase patient independence at home. Baseline: mod assist for supine to sit and sit to supine Goal status:  Ongoing   5.  Patient will report at least 50% improvement in overall symptoms and/or function to demonstrate improved functional mobility   Baseline:  Goal status:  Ongoing       PLAN: PT FREQUENCY: 2x/week   PT DURATION: 4 more weeks   PLANNED INTERVENTIONS: Therapeutic exercises, Therapeutic activity, Neuromuscular re-education, Balance training, Gait training, Patient/Family education, Joint manipulation, Joint mobilization, Stair training,  Vestibular training, Orthotic/Fit training, DME instructions, Aquatic Therapy, Dry Needling, Electrical stimulation, Spinal manipulation, Spinal mobilization, Cryotherapy, Moist heat, Compression bandaging, Taping, Traction, Ultrasound, Contrast bath, Biofeedback, and Manual therapy   PLAN FOR NEXT SESSION:  requesting additional 2 x week x 4 weeks to address remaining unmet and partially met goals; work on balance.   3:31 PM, 01/06/22 Demarkus Remmel Small Jamol Ginyard MPT [facility] [place] [phone]

## 2022-01-07 ENCOUNTER — Ambulatory Visit
Admission: RE | Admit: 2022-01-07 | Discharge: 2022-01-07 | Disposition: A | Discharge status: Home / Self Care | Payer: 59 | Source: Ambulatory Visit | Attending: Gastroenterology | Admitting: Gastroenterology

## 2022-01-07 DIAGNOSIS — K7460 Unspecified cirrhosis of liver: Secondary | ICD-10-CM | POA: Diagnosis present

## 2022-01-07 DIAGNOSIS — R188 Other ascites: Secondary | ICD-10-CM | POA: Diagnosis not present

## 2022-01-07 DIAGNOSIS — K7581 Nonalcoholic steatohepatitis (NASH): Secondary | ICD-10-CM | POA: Insufficient documentation

## 2022-01-07 MED ORDER — ALBUMIN HUMAN 25 % IV SOLN
25.0000 g | Freq: Once | INTRAVENOUS | Status: AC
Start: 2022-01-07 — End: 2022-01-07

## 2022-01-07 MED ORDER — ALBUMIN HUMAN 25 % IV SOLN
INTRAVENOUS | Status: AC
  Administered 2022-01-07: 25 g via INTRAVENOUS
  Filled 2022-01-07: qty 100

## 2022-01-07 NOTE — Procedures (Signed)
PROCEDURE SUMMARY:  Successful US guided paracentesis from LLQ. Yielded 8 L of clear yellow fluid.  No immediate complications.  Pt tolerated well.   Specimen was not sent for labs.  EBL None.  Tsosie Billing D PA-C 01/07/2022 2:18 PM

## 2022-01-08 ENCOUNTER — Ambulatory Visit (HOSPITAL_COMMUNITY): Payer: 59

## 2022-01-09 DIAGNOSIS — R188 Other ascites: Secondary | ICD-10-CM | POA: Diagnosis not present

## 2022-01-10 ENCOUNTER — Inpatient Hospital Stay (HOSPITAL_COMMUNITY): Payer: 59

## 2022-01-10 VITALS — BP 102/50 | HR 69 | Temp 97.6°F | Resp 20

## 2022-01-10 DIAGNOSIS — D500 Iron deficiency anemia secondary to blood loss (chronic): Secondary | ICD-10-CM | POA: Diagnosis not present

## 2022-01-10 DIAGNOSIS — E538 Deficiency of other specified B group vitamins: Secondary | ICD-10-CM | POA: Diagnosis not present

## 2022-01-10 DIAGNOSIS — Z803 Family history of malignant neoplasm of breast: Secondary | ICD-10-CM | POA: Diagnosis not present

## 2022-01-10 DIAGNOSIS — K7460 Unspecified cirrhosis of liver: Secondary | ICD-10-CM | POA: Diagnosis not present

## 2022-01-10 DIAGNOSIS — D563 Thalassemia minor: Secondary | ICD-10-CM | POA: Diagnosis not present

## 2022-01-10 DIAGNOSIS — Z8542 Personal history of malignant neoplasm of other parts of uterus: Secondary | ICD-10-CM | POA: Diagnosis not present

## 2022-01-10 DIAGNOSIS — Z90710 Acquired absence of both cervix and uterus: Secondary | ICD-10-CM | POA: Diagnosis not present

## 2022-01-10 DIAGNOSIS — Z8051 Family history of malignant neoplasm of kidney: Secondary | ICD-10-CM | POA: Diagnosis not present

## 2022-01-10 DIAGNOSIS — E119 Type 2 diabetes mellitus without complications: Secondary | ICD-10-CM | POA: Diagnosis not present

## 2022-01-10 DIAGNOSIS — Z807 Family history of other malignant neoplasms of lymphoid, hematopoietic and related tissues: Secondary | ICD-10-CM | POA: Diagnosis not present

## 2022-01-10 DIAGNOSIS — R262 Difficulty in walking, not elsewhere classified: Secondary | ICD-10-CM | POA: Diagnosis not present

## 2022-01-10 DIAGNOSIS — R2689 Other abnormalities of gait and mobility: Secondary | ICD-10-CM | POA: Diagnosis not present

## 2022-01-10 DIAGNOSIS — E559 Vitamin D deficiency, unspecified: Secondary | ICD-10-CM | POA: Diagnosis not present

## 2022-01-10 DIAGNOSIS — M6281 Muscle weakness (generalized): Secondary | ICD-10-CM | POA: Diagnosis not present

## 2022-01-10 MED ORDER — SODIUM CHLORIDE 0.9 % IV SOLN: Freq: Once | INTRAVENOUS | Status: AC

## 2022-01-10 MED ORDER — LORATADINE 10 MG PO TABS
10.0000 mg | ORAL_TABLET | Freq: Once | ORAL | Status: AC
  Administered 2022-01-10: 10 mg via ORAL
  Filled 2022-01-10: qty 1

## 2022-01-10 MED ORDER — SODIUM CHLORIDE 0.9 % IV SOLN
300.0000 mg | Freq: Once | INTRAVENOUS | Status: AC
  Administered 2022-01-10: 300 mg via INTRAVENOUS
  Filled 2022-01-10: qty 300

## 2022-01-10 NOTE — Patient Instructions (Signed)
[facility]  Discharge Instructions: Thank you for choosing [facility] to provide your oncology and hematology care.  If you have a lab appointment with the [facility], please come in thru the Main Entrance and check in at the main information desk.  Wear comfortable clothing and clothing appropriate for easy access to any Portacath or PICC line.   We strive to give you quality time with your provider. You may need to reschedule your appointment if you arrive late (15 or more minutes).  Arriving late affects you and other patients whose appointments are after yours.  Also, if you miss three or more appointments without notifying the office, you may be dismissed from the clinic at the provider's discretion.      For prescription refill requests, have your pharmacy contact our office and allow 72 hours for refills to be completed.    Today you received the following chemotherapy and/or immunotherapy agents Venofer      To help prevent nausea and vomiting after your treatment, we encourage you to take your nausea medication as directed.  BELOW ARE SYMPTOMS THAT SHOULD BE REPORTED IMMEDIATELY: *FEVER GREATER THAN 100.4 F (38 C) OR HIGHER *CHILLS OR SWEATING *NAUSEA AND VOMITING THAT IS NOT CONTROLLED WITH YOUR NAUSEA MEDICATION *UNUSUAL SHORTNESS OF BREATH *UNUSUAL BRUISING OR BLEEDING *URINARY PROBLEMS (pain or burning when urinating, or frequent urination) *BOWEL PROBLEMS (unusual diarrhea, constipation, pain near the anus) TENDERNESS IN MOUTH AND THROAT WITH OR WITHOUT PRESENCE OF ULCERS (sore throat, sores in mouth, or a toothache) UNUSUAL RASH, SWELLING OR PAIN  UNUSUAL VAGINAL DISCHARGE OR ITCHING   Items with * indicate a potential emergency and should be followed up as soon as possible or go to the Emergency Department if any problems should occur.  Please show the CHEMOTHERAPY ALERT CARD or IMMUNOTHERAPY ALERT CARD at check-in to the Emergency  Department and triage nurse.  Should you have questions after your visit or need to cancel or reschedule your appointment, please contact [facility] [phone]  and follow the prompts.  Office hours are 8:00 a.m. to 4:30 p.m. Monday - Friday. Please note that voicemails left after 4:00 p.m. may not be returned until the following business day.  We are closed weekends and major holidays. You have access to a nurse at all times for urgent questions. Please call the main number to the clinic [phone] and follow the prompts.  For any non-urgent questions, you may also contact your provider using MyChart. We now offer e-Visits for anyone 52 and older to request care online for non-urgent symptoms. For details visit [email or web address].   Also download the MyChart app! Go to the app store, search "MyChart", open the app, select [facility], and log in with your MyChart username and password.  Due to Covid, a mask is required upon entering the hospital/clinic. If you do not have a mask, one will be given to you upon arrival. For doctor visits, patients may have 1 support person aged 59 or older with them. For treatment visits, patients cannot have anyone with them due to current Covid guidelines and our immunocompromised population.

## 2022-01-10 NOTE — Progress Notes (Signed)
Patient presents today for Venofer infusion per providers order.  Vital signs WNL.  Patient has no new complaints at this time.  Peripheral IV started and blood return noted pre and post infusion.  Venofer given today per MD orders.  Stabled during infusion without adverse affects.  Vital signs stable.  No complaints at this time.  Discharge from clinic via wheelchair in stable condition.  Alert and oriented X 3.  Follow up with [facility] as scheduled.

## 2022-01-14 ENCOUNTER — Other Ambulatory Visit: Payer: Self-pay | Admitting: Gastroenterology

## 2022-01-14 ENCOUNTER — Ambulatory Visit
Admission: RE | Admit: 2022-01-14 | Discharge: 2022-01-14 | Disposition: A | Discharge status: Home / Self Care | Payer: 59 | Source: Ambulatory Visit | Attending: Gastroenterology | Admitting: Gastroenterology

## 2022-01-14 DIAGNOSIS — R188 Other ascites: Secondary | ICD-10-CM | POA: Diagnosis not present

## 2022-01-14 DIAGNOSIS — K7581 Nonalcoholic steatohepatitis (NASH): Secondary | ICD-10-CM | POA: Diagnosis present

## 2022-01-14 DIAGNOSIS — K7460 Unspecified cirrhosis of liver: Secondary | ICD-10-CM | POA: Diagnosis present

## 2022-01-14 MED ORDER — ALBUMIN HUMAN 25 % IV SOLN
INTRAVENOUS | Status: AC
  Administered 2022-01-14: 25 g via INTRAVENOUS
  Filled 2022-01-14: qty 100

## 2022-01-14 MED ORDER — ALBUMIN HUMAN 25 % IV SOLN: 25.0000 g | Freq: Once | INTRAVENOUS | Status: AC

## 2022-01-14 NOTE — Procedures (Signed)
PROCEDURE SUMMARY:  Successful US guided paracentesis from RLQ Yielded 8 L of clear yellow fluid.  No immediate complications.  Pt tolerated well.   Specimen was not sent for labs.  EBL < 69m  MRockney Ghee5/30/2023 2:17 PM

## 2022-01-16 ENCOUNTER — Ambulatory Visit (HOSPITAL_COMMUNITY): Payer: 59 | Attending: Internal Medicine | Admitting: Physical Therapy

## 2022-01-16 DIAGNOSIS — R262 Difficulty in walking, not elsewhere classified: Secondary | ICD-10-CM

## 2022-01-16 DIAGNOSIS — R269 Unspecified abnormalities of gait and mobility: Secondary | ICD-10-CM | POA: Diagnosis not present

## 2022-01-16 DIAGNOSIS — M6281 Muscle weakness (generalized): Secondary | ICD-10-CM

## 2022-01-16 DIAGNOSIS — R2689 Other abnormalities of gait and mobility: Secondary | ICD-10-CM

## 2022-01-16 DIAGNOSIS — K7460 Unspecified cirrhosis of liver: Secondary | ICD-10-CM

## 2022-01-16 NOTE — Therapy (Signed)
OUTPATIENT PHYSICAL THERAPY TREATMENT  Patient Name: Rachel Huber MRN: [id number] DOB:Jan 04, 1956, 66 y.o., female Today's Date: 01/16/2022  PCP:  [place] MD  REFERRING PROVIDER: Eulis Canner, MD   END OF SESSION:   PT End of Session - 01/16/22 1131     Visit Number 7    Number of Visits 14    Date for PT Re-Evaluation 01/08/22    Authorization Type [employer] [place]; 35 visit limit    Authorization - Visit Number 7    Authorization - Number of Visits 35    PT Start Time 1130    PT Stop Time 1215    PT Time Calculation (min) 45 min    Activity Tolerance Patient tolerated treatment well    Behavior During Therapy WFL for tasks assessed/performed               Past Medical History:  Diagnosis Date   Anxiety    Arthritis    Cirrhosis of liver ([place])    Diabetes mellitus without complication ([place])    Dyspnea    DOE   GERD (gastroesophageal reflux disease)    Hepatitis    PAST   Hypertension    Neuropathy    Neuropathy, diabetic ([place])    Pneumonia    Sinus complaint    Sleep apnea    CPAP   Thalassemia minor 1992   Past Surgical History:  Procedure Laterality Date   ABDOMINAL HYSTERECTOMY     BREAST BIOPSY Right 02/15/2018   [place] bx 6-6:30 ribbon shape, ONE CORE FRAGMENT WITH FIBROSIS. ONE CORE FRAGMENT WITH PORTION OF A DILATED   BREAST BIOPSY Right 02/15/2018   [place] bx 9:00 heart shape, USUAL DUCTAL HYPERPLASIA   BREAST LUMPECTOMY Right 03/09/2018   Procedure: BREAST LUMPECTOMY x 2;  Surgeon: Benjamine Sprague, DO;  Location: [facility] ORS;  Service: General;  Laterality: Right;   CATARACT EXTRACTION W/PHACO Left 06/11/2016   Procedure: CATARACT EXTRACTION PHACO AND INTRAOCULAR LENS PLACEMENT (IOC);  Surgeon: Estill Cotta, MD;  Location: [facility] ORS;  Service: Ophthalmology;  Laterality: Left;  Lot # [id number] H US:01:38.6 AP%:26.4 CDE:44.15   CATARACT EXTRACTION W/PHACO Right 06/15/2018   Procedure: CATARACT EXTRACTION PHACO AND  INTRAOCULAR LENS PLACEMENT (IOC);  Surgeon: Birder Robson, MD;  Location: [facility] ORS;  Service: Ophthalmology;  Laterality: Right;  [place] 00:38.2 CDE 4.23 Fluid Pack Lot # [id number]   COLONOSCOPIES     COLONOSCOPY WITH PROPOFOL N/A 10/10/2020   Procedure: COLONOSCOPY WITH PROPOFOL;  Surgeon: Lesly Rubenstein, MD;  Location: [facility] ENDOSCOPY;  Service: Endoscopy;  Laterality: N/A;   COLONOSCOPY WITH PROPOFOL N/A 11/20/2020   Procedure: COLONOSCOPY WITH PROPOFOL;  Surgeon: Lesly Rubenstein, MD;  Location: [facility] ENDOSCOPY;  Service: Endoscopy;  Laterality: N/A;  DM STAT CBC, BMP COVID POSITIVE 09/02/2020   CTR     ESOPHAGOGASTRODUODENOSCOPY (EGD) WITH PROPOFOL N/A 10/10/2020   Procedure: ESOPHAGOGASTRODUODENOSCOPY (EGD) WITH PROPOFOL;  Surgeon: Lesly Rubenstein, MD;  Location: [facility] ENDOSCOPY;  Service: Endoscopy;  Laterality: N/A;  COVID POSITIVE 10/08/2020   IR PARACENTESIS  12/17/2021   JOINT REPLACEMENT     KNEE SURGERY Right 09/24/2017   plates and pins   TOTAL SHOULDER ARTHROPLASTY Right 09/27/2015   Patient Active Problem List   Diagnosis Date Noted   Iron deficiency anemia due to chronic blood loss 02/13/2021   History of uterine cancer 02/13/2021   Decompensated hepatic cirrhosis ([place]) 10/25/2020   OSA (obstructive sleep apnea) 10/25/2020   Glossitis 10/25/2020   Grade IV internal  hemorrhoids    Acute blood loss anemia 10/08/2020   Hyponatremia 10/08/2020   Rectal bleeding 10/08/2020   Acute respiratory failure with hypoxia ([place]) 09/10/2020   Pneumonia due to COVID-19 virus 09/08/2020   Hip fracture, right ([place]) 09/04/2020   Closed right hip fracture ([place]) 09/02/2020   Diabetes mellitus with hyperglycemia ([place]) 09/02/2020   Hypertension    Thalassemia minor    Pancytopenia ([place])    HLD (hyperlipidemia)    Acute renal failure superimposed on stage 3a chronic kidney disease ([place])    Depression    Fall at home, initial encounter    Nondisplaced fracture of greater trochanter  of right femur, initial encounter for closed fracture ([place])     REFERRING DIAG: PT eval/tx for gait difficulty, R26.9   THERAPY DIAG:  Difficulty in walking, not elsewhere classified  Cirrhosis, non-alcoholic (HCC)  Muscle weakness (generalized)  Other abnormalities of gait and mobility PERTINENT HISTORY: Trying to get on liver transplant list, DM R shoulder THA Feb 2019 R knee pain (OA)   PRECAUTIONS: Fall  SUBJECTIVE: Reports her knees are much better, edema down after having the paracentesis last week.  States she is overall feeling much better without pain anywhere. PAIN:  Are you having pain? Yes: NPRS scale: 0/10 Pain location:  Pain description:  Aggravating factors: Relieving factors:   OBJECTIVE:    COGNITION:           Overall cognitive status: Within functional limits for tasks assessed                          SENSATION: Light touch: hypersensitive to touch   POSTURE:  Forward flexed at the trunk with standing and walking; noted distended abdomen and swelling in legs.    PALPATION: Tightness, swelling noted bilateral lower extremities   LE ROM:   Active ROM Right 12/09/2021 Left 12/09/2021 Right 01/06/2022 Left 01/06/2022  Hip flexion        Hip extension        Hip abduction        Hip adduction        Hip internal rotation        Hip external rotation        Knee flexion        Knee extension        Ankle dorsiflexion limited limited wfl wfl  Ankle plantarflexion        Ankle inversion        Ankle eversion         (Blank rows = not tested)   LE MMT:   MMT Right 12/09/2021 Left 12/09/2021 Right 01/06/2022 Left 01/06/2022  Hip flexion 4- 4- 4+ 4+  Hip extension        Hip abduction        Hip adduction        Hip internal rotation        Hip external rotation        Knee flexion(sitting) 4 4- Sitting 4+ Sitting 4+  Knee extension 4+ 4 4+ 4+  Ankle dorsiflexion 2+ 2+ 4 4  Ankle plantarflexion        Ankle inversion        Ankle  eversion         (Blank rows = not tested)   BED MOBILITY: Needs moderate Assistance with legs with sit to supine and moderate assistance with trunk for supine to sit   FUNCTIONAL TESTS:  2 MWT 138 ft with RW; 01/06/2022 180 ft with SPC 5 x  sit to stand 31 sec with Ue's to assist up to stand, 24 sec using Ue's to assist    GAIT: Distance walked: 138 ft; 01/06/2022 180 ft with [facility] Assistive device utilized: Environmental consultant - 2 wheeled; 01/06/2022 with SPC Level of assistance: SBA Comments: mildly SOB after walking       TODAY'S TREATMENT:  01/16/22   Gait around clinic 200 feet with SPC Alt step taps 4 inch  x 20 int HHA Lunges onto 4"step no UE 10X each working on stability Tandem x 30" on foam balance beam with 1# UE flexion off // bars 10X each LE lead  Vectors 5X5" each with 1 HHA Gait over 3 6" hurdles in // bar with 1 UE assist 3RT  01/06/2022 Progress note Gait: 2MWT 180 ft with SPC and SBA 5 x sit to stand test with Ue's to assist up to stand 24 sec Updated HEP Exercises - Standing Toe Taps  - 2-3 x daily - 7 x weekly - 1 sets - 10 reps - Standing Tandem Balance with Counter Support  - 2-3 x daily - 7 x weekly - 3 sets - 3 reps - 30" hold   01/02/22 Nustep 4 minutes lv3 UE/LE seat 12 Alt step taps 4 inch  x 20 int HHA Tandem stance x30" solid floor Tandem x 30" on foam  Gait with head turn 30 feet Gait over obstacles 30 feet    PATIENT EDUCATION:  Education details: HEP instruction, disease progression, balance impairments 12/16/21 on therapy goals, exercise form and function  Person educated: Patient Education method: Explanation, Demonstration, and Handouts Education comprehension: verbalized understanding, returned demonstration, and needs further education     HOME EXERCISE PROGRAM: Access Code: [email or web address] URL: [email or web address] Date: 01/06/2022 Prepared by: AP - Rehab  Exercises - Standing Toe Taps  - 2-3 x daily - 7 x weekly - 1 sets - 10  reps - Standing Tandem Balance with Counter Support  - 2-3 x daily - 7 x weekly - 3 sets - 3 reps - 30" hold  Access Code: [email or web address] URL: [email or web address] Date: 12/09/2021 Prepared by: AP - Rehab   Exercises - Seated Heel Toe Raises  - 2 x daily - 7 x weekly - 1 sets - 10 reps - Seated Long Arc Quad  - 2 x daily - 7 x weekly - 1 sets - 10 reps - Seated March  - 2 x daily - 7 x weekly - 1 sets - 10 reps - Supine Active Straight Leg Raise  - 7 x weekly - 1 sets - 10 reps   ASSESSMENT:   CLINICAL IMPRESSION: Much better today after getting fluid removed last week.  Continued with focus on improving LE strength and stability.  Began with ambulation around clinic to warm up mm and progressed to LE strengthening.  Forward lunges began and challenging for patient, however able to complete without UE assist.  Increased difficulty of tandem stance on foam with addition of UE activity also with challenge. PT unable to clear two hurdles during 3RT of step overs.   Pt required intermittent seated rest breaks during session today due to fatigue.  Pt will continue to benefit from skilled physical therapy to improve strength and stability.    OBJECTIVE IMPAIRMENTS Abnormal gait, decreased activity tolerance, decreased balance, decreased coordination, decreased endurance, decreased mobility, difficulty walking, decreased ROM, decreased strength, decreased safety awareness, hypomobility, increased edema, impaired perceived  functional ability, impaired flexibility, impaired sensation, improper body mechanics, and pain.    ACTIVITY LIMITATIONS cleaning, community activity, driving, meal prep, laundry, medication management, yard work, and shopping.    PERSONAL FACTORS Fitness and 1-2 comorbidities: liver cirrhosis and DM  are also affecting patient's functional outcome.      REHAB POTENTIAL: Fair secondary to progressive liver disease   CLINICAL DECISION MAKING: Stable/uncomplicated    EVALUATION COMPLEXITY: Low     GOALS: Goals reviewed with patient? No   SHORT TERM GOALS: Target date: 12/23/2021     Patient with be independent with initial HEP to  improve functional outcomes.   Goal status: met   2.  Patient will increased her 2MWT to 155 ft to demonstrate improved functional mobility and easier access to her home enviroment Baseline: 138 ft, 01/06/2022 180 ft  Goal status:  met     LONG TERM GOALS: Target date: 02/08/2022   Patient will be independent with advanced HEP and self management strategies to improve quality of life and functional outcomes.   Goal status:  Ongoing   2.  Patient will improve her 5 times STS score from 31 sec to 27 sec or less to demonstrate improved functional mobility and increased lower extremity strength Baseline: 31 sec, 01/06/2022 24 sec Goal status:  met   3.  Patient will increased her 2MWT to 185 ft to demonstrate improved functional mobility and easier access to community Baseline: 138 ft, 01/06/2022 180 ft with SPC Goal status:  Ongoing   4.  Patient will be able to perform supine to sit and sit to supine modified independently to increase patient independence at home. Baseline: mod assist for supine to sit and sit to supine Goal status:  Ongoing   5.  Patient will report at least 50% improvement in overall symptoms and/or function to demonstrate improved functional mobility   Baseline:  Goal status:  Ongoing       PLAN: PT FREQUENCY: 2x/week   PT DURATION: 4 more weeks   PLANNED INTERVENTIONS: Therapeutic exercises, Therapeutic activity, Neuromuscular re-education, Balance training, Gait training, Patient/Family education, Joint manipulation, Joint mobilization, Stair training, Vestibular training, Orthotic/Fit training, DME instructions, Aquatic Therapy, Dry Needling, Electrical stimulation, Spinal manipulation, Spinal mobilization, Cryotherapy, Moist heat, Compression bandaging, Taping, Traction, Ultrasound,  Contrast bath, Biofeedback, and Manual therapy   PLAN FOR NEXT SESSION:  Continue to address remaining unmet and partially met goals; work on balance.   12:15 PM, 01/16/22 Teena Irani, PTA/CLT [place] Ph: [phone]

## 2022-01-17 ENCOUNTER — Inpatient Hospital Stay (HOSPITAL_COMMUNITY): Payer: 59 | Attending: Physician Assistant

## 2022-01-17 VITALS — BP 110/46 | HR 79 | Temp 98.2°F | Resp 18

## 2022-01-17 DIAGNOSIS — E538 Deficiency of other specified B group vitamins: Secondary | ICD-10-CM | POA: Diagnosis not present

## 2022-01-17 DIAGNOSIS — D563 Thalassemia minor: Secondary | ICD-10-CM | POA: Diagnosis not present

## 2022-01-17 DIAGNOSIS — D500 Iron deficiency anemia secondary to blood loss (chronic): Secondary | ICD-10-CM | POA: Diagnosis not present

## 2022-01-17 MED ORDER — SODIUM CHLORIDE 0.9 % IV SOLN: Freq: Once | INTRAVENOUS | Status: AC

## 2022-01-17 MED ORDER — LORATADINE 10 MG PO TABS
10.0000 mg | ORAL_TABLET | Freq: Once | ORAL | Status: AC
  Administered 2022-01-17: 10 mg via ORAL
  Filled 2022-01-17: qty 1

## 2022-01-17 MED ORDER — SODIUM CHLORIDE 0.9 % IV SOLN
300.0000 mg | Freq: Once | INTRAVENOUS | Status: AC
  Administered 2022-01-17: 300 mg via INTRAVENOUS
  Filled 2022-01-17: qty 300

## 2022-01-17 NOTE — Progress Notes (Signed)
Patient presents today for Venofer infusion per providers order.  Vital signs WNL.  Patient has no new complaints at this time.    Peripheral IV started and blood return noted pre and post infusion.    Venofer given today per MD orders.  Stable during infusion without adverse affects.  Vital signs stable.  No complaints at this time.  Discharge from clinic ambulatory in stable condition.  Alert and oriented X 3.  Follow up with [facility] as scheduled.

## 2022-01-17 NOTE — Patient Instructions (Signed)
[facility]  Discharge Instructions: Thank you for choosing [facility] to provide your oncology and hematology care.  If you have a lab appointment with the [facility], please come in thru the Main Entrance and check in at the main information desk.  Wear comfortable clothing and clothing appropriate for easy access to any Portacath or PICC line.   We strive to give you quality time with your provider. You may need to reschedule your appointment if you arrive late (15 or more minutes).  Arriving late affects you and other patients whose appointments are after yours.  Also, if you miss three or more appointments without notifying the office, you may be dismissed from the clinic at the provider's discretion.      For prescription refill requests, have your pharmacy contact our office and allow 72 hours for refills to be completed.    Today you received the following chemotherapy and/or immunotherapy agents Venofer      To help prevent nausea and vomiting after your treatment, we encourage you to take your nausea medication as directed.  BELOW ARE SYMPTOMS THAT SHOULD BE REPORTED IMMEDIATELY: *FEVER GREATER THAN 100.4 F (38 C) OR HIGHER *CHILLS OR SWEATING *NAUSEA AND VOMITING THAT IS NOT CONTROLLED WITH YOUR NAUSEA MEDICATION *UNUSUAL SHORTNESS OF BREATH *UNUSUAL BRUISING OR BLEEDING *URINARY PROBLEMS (pain or burning when urinating, or frequent urination) *BOWEL PROBLEMS (unusual diarrhea, constipation, pain near the anus) TENDERNESS IN MOUTH AND THROAT WITH OR WITHOUT PRESENCE OF ULCERS (sore throat, sores in mouth, or a toothache) UNUSUAL RASH, SWELLING OR PAIN  UNUSUAL VAGINAL DISCHARGE OR ITCHING   Items with * indicate a potential emergency and should be followed up as soon as possible or go to the Emergency Department if any problems should occur.  Please show the CHEMOTHERAPY ALERT CARD or IMMUNOTHERAPY ALERT CARD at check-in to the Emergency  Department and triage nurse.  Should you have questions after your visit or need to cancel or reschedule your appointment, please contact [facility] [phone]  and follow the prompts.  Office hours are 8:00 a.m. to 4:30 p.m. Monday - Friday. Please note that voicemails left after 4:00 p.m. may not be returned until the following business day.  We are closed weekends and major holidays. You have access to a nurse at all times for urgent questions. Please call the main number to the clinic [phone] and follow the prompts.  For any non-urgent questions, you may also contact your provider using MyChart. We now offer e-Visits for anyone 34 and older to request care online for non-urgent symptoms. For details visit [email or web address].   Also download the MyChart app! Go to the app store, search "MyChart", open the app, select [facility], and log in with your MyChart username and password.  Due to Covid, a mask is required upon entering the hospital/clinic. If you do not have a mask, one will be given to you upon arrival. For doctor visits, patients may have 1 support person aged 31 or older with them. For treatment visits, patients cannot have anyone with them due to current Covid guidelines and our immunocompromised population.

## 2022-01-20 ENCOUNTER — Telehealth (HOSPITAL_COMMUNITY): Payer: Self-pay | Admitting: Physical Therapy

## 2022-01-20 ENCOUNTER — Encounter (HOSPITAL_COMMUNITY): Payer: 59 | Admitting: Physical Therapy

## 2022-01-20 NOTE — Telephone Encounter (Signed)
First no show:  Called pt who had forgotten appointment.  PT will be here on Thursday. Rayetta Humphrey, [place] CLT [phone]

## 2022-01-21 ENCOUNTER — Ambulatory Visit
Admission: RE | Admit: 2022-01-21 | Discharge: 2022-01-21 | Disposition: A | Discharge status: Home / Self Care | Payer: 59 | Source: Ambulatory Visit | Attending: Gastroenterology | Admitting: Gastroenterology

## 2022-01-21 DIAGNOSIS — K7581 Nonalcoholic steatohepatitis (NASH): Secondary | ICD-10-CM | POA: Insufficient documentation

## 2022-01-21 DIAGNOSIS — K7460 Unspecified cirrhosis of liver: Secondary | ICD-10-CM | POA: Insufficient documentation

## 2022-01-21 DIAGNOSIS — R188 Other ascites: Secondary | ICD-10-CM | POA: Diagnosis not present

## 2022-01-21 MED ORDER — ALBUMIN HUMAN 25 % IV SOLN
INTRAVENOUS | Status: AC
  Administered 2022-01-21: 25 g via INTRAVENOUS
  Filled 2022-01-21: qty 100

## 2022-01-21 MED ORDER — ALBUMIN HUMAN 25 % IV SOLN: 25.0000 g | Freq: Once | INTRAVENOUS | Status: AC

## 2022-01-21 NOTE — Procedures (Signed)
PROCEDURE SUMMARY:  Successful US guided paracentesis from [facility].  Yielded 7.1 Liter of clear yellow fluid.  No immediate complications.  Pt tolerated well.   Specimen was not sent for labs.  EBL < 49m  MHedy JacobPA-C 01/21/2022 10:50 AM

## 2022-01-23 ENCOUNTER — Ambulatory Visit (HOSPITAL_COMMUNITY): Payer: 59 | Attending: Internal Medicine

## 2022-01-23 DIAGNOSIS — K7460 Unspecified cirrhosis of liver: Secondary | ICD-10-CM | POA: Insufficient documentation

## 2022-01-23 DIAGNOSIS — M6281 Muscle weakness (generalized): Secondary | ICD-10-CM | POA: Diagnosis not present

## 2022-01-23 DIAGNOSIS — R262 Difficulty in walking, not elsewhere classified: Secondary | ICD-10-CM | POA: Diagnosis not present

## 2022-01-23 DIAGNOSIS — R2689 Other abnormalities of gait and mobility: Secondary | ICD-10-CM | POA: Diagnosis not present

## 2022-01-23 NOTE — Therapy (Signed)
OUTPATIENT PHYSICAL THERAPY TREATMENT  Patient Name: Rachel Huber MRN: [id number] DOB:09/13/1955, 66 y.o., female Today's Date: 01/23/2022  PCP: Frazier Richards MD  REFERRING PROVIDER: Eulis Canner, MD   END OF SESSION:   PT End of Session - 01/23/22 1441     Visit Number 8    Number of Visits 14    Date for PT Re-Evaluation 01/08/22    Authorization Type [employer]; 35 visit limit    Authorization - Visit Number 8    Authorization - Number of Visits 35    PT Start Time 1440   late   PT Stop Time 1519    PT Time Calculation (min) 39 min    Activity Tolerance Patient tolerated treatment well    Behavior During Therapy WFL for tasks assessed/performed                Past Medical History:  Diagnosis Date   Anxiety    Arthritis    Cirrhosis of liver ([place])    Diabetes mellitus without complication ([place])    Dyspnea    DOE   GERD (gastroesophageal reflux disease)    Hepatitis    PAST   Hypertension    Neuropathy    Neuropathy, diabetic ([place])    Pneumonia    Sinus complaint    Sleep apnea    CPAP   Thalassemia minor 1992   Past Surgical History:  Procedure Laterality Date   ABDOMINAL HYSTERECTOMY     BREAST BIOPSY Right 02/15/2018   [place] bx 6-6:30 ribbon shape, ONE CORE FRAGMENT WITH FIBROSIS. ONE CORE FRAGMENT WITH PORTION OF A DILATED   BREAST BIOPSY Right 02/15/2018   [place] bx 9:00 heart shape, USUAL DUCTAL HYPERPLASIA   BREAST LUMPECTOMY Right 03/09/2018   Procedure: BREAST LUMPECTOMY x 2;  Surgeon: Benjamine Sprague, DO;  Location: [facility] ORS;  Service: General;  Laterality: Right;   CATARACT EXTRACTION W/PHACO Left 06/11/2016   Procedure: CATARACT EXTRACTION PHACO AND INTRAOCULAR LENS PLACEMENT (IOC);  Surgeon: Estill Cotta, MD;  Location: [facility] OR[facility];  Service: Ophthalmology;  Laterality: Left;  Lot # [id number] H US:01:38.6 AP%:26.4 CDE:44.15   CATARACT EXTRACTION W/PHACO Right 06/15/2018   Procedure: CATARACT EXTRACTION PHACO AND  INTRAOCULAR LENS PLACEMENT (IOC);  Surgeon: Birder Robson, MD;  Location: [facility];  Service: Ophthalmology;  Laterality: Right;  [place] 00:38.2 CDE 4.23 Fluid Pack Lot # [id number]   COLONOSCOPIES     COLONOSCOPY WITH PROPOFOL N/A 10/10/2020   Procedure: COLONOSCOPY WITH PROPOFOL;  Surgeon: Lesly Rubenstein, MD;  Location: [facility] ENDOSCOPY;  Service: Endoscopy;  Laterality: N/A;   COLONOSCOPY WITH PROPOFOL N/A 11/20/2020   Procedure: COLONOSCOPY WITH PROPOFOL;  Surgeon: Lesly Rubenstein, MD;  Location: [facility] ENDOSCOPY;  Service: Endoscopy;  Laterality: N/A;  DM STAT CBC, BMP COVID POSITIVE 09/02/2020   CTR     ESOPHAGOGASTRODUODENOSCOPY (EGD) WITH PROPOFOL N/A 10/10/2020   Procedure: ESOPHAGOGASTRODUODENOSCOPY (EGD) WITH PROPOFOL;  Surgeon: Lesly Rubenstein, MD;  Location: [facility] ENDOSCOPY;  Service: Endoscopy;  Laterality: N/A;  COVID POSITIVE 10/08/2020   IR PARACENTESIS  12/17/2021   JOINT REPLACEMENT     KNEE SURGERY Right 09/24/2017   plates and pins   TOTAL SHOULDER ARTHROPLASTY Right 09/27/2015   Patient Active Problem List   Diagnosis Date Noted   Iron deficiency anemia due to chronic blood loss 02/13/2021   History of uterine cancer 02/13/2021   Decompensated hepatic cirrhosis ([place]) 10/25/2020   OSA (obstructive sleep apnea) 10/25/2020   Glossitis 10/25/2020  Grade IV internal hemorrhoids    Acute blood loss anemia 10/08/2020   Hyponatremia 10/08/2020   Rectal bleeding 10/08/2020   Acute respiratory failure with hypoxia ([place]) 09/10/2020   Pneumonia due to COVID-19 virus 09/08/2020   Hip fracture, right ([place]) 09/04/2020   Closed right hip fracture ([place]) 09/02/2020   Diabetes mellitus with hyperglycemia ([place]) 09/02/2020   Hypertension    Thalassemia minor    Pancytopenia ([place])    HLD (hyperlipidemia)    Acute renal failure superimposed on stage 3a chronic kidney disease ([place])    Depression    Fall at home, initial encounter    Nondisplaced fracture of greater trochanter  of right femur, initial encounter for closed fracture ([place])     REFERRING DIAG: PT eval/tx for gait difficulty, R26.9   THERAPY DIAG:  Difficulty in walking, not elsewhere classified  Cirrhosis, non-alcoholic (HCC)  Muscle weakness (generalized)  Other abnormalities of gait and mobility PERTINENT HISTORY: Trying to get on liver transplant list, DM R shoulder THA Feb 2019 R knee pain ([facility])   PRECAUTIONS: Fall  SUBJECTIVE: Having paracentesis every Tuesday now. As the week progresses she starts to hold fluid.  She is walking slower today of note. Mild SOB noted  PAIN:  Are you having pain? Yes: NPRS scale: 4-5/10 Pain location: skin on legs, low back Pain description: sensitive Aggravating factors:prolonged standing (back) Relieving factors: rest  OBJECTIVE:    COGNITION:           Overall cognitive status: Within functional limits for tasks assessed                          SENSATION: Light touch: hypersensitive to touch   POSTURE:  Forward flexed at the trunk with standing and walking; noted distended abdomen and swelling in legs.    PALPATION: Tightness, swelling noted bilateral lower extremities   LE ROM:   Active ROM Right 12/09/2021 Left 12/09/2021 Right 01/06/2022 Left 01/06/2022  Hip flexion        Hip extension        Hip abduction        Hip adduction        Hip internal rotation        Hip external rotation        Knee flexion        Knee extension        Ankle dorsiflexion limited limited wfl wfl  Ankle plantarflexion        Ankle inversion        Ankle eversion         (Blank rows = not tested)   LE MMT:   MMT Right 12/09/2021 Left 12/09/2021 Right 01/06/2022 Left 01/06/2022  Hip flexion 4- 4- 4+ 4+  Hip extension        Hip abduction        Hip adduction        Hip internal rotation        Hip external rotation        Knee flexion(sitting) 4 4- Sitting 4+ Sitting 4+  Knee extension 4+ 4 4+ 4+  Ankle dorsiflexion 2+ 2+ 4 4  Ankle  plantarflexion        Ankle inversion        Ankle eversion         (Blank rows = not tested)   BED MOBILITY: Needs moderate Assistance with legs with sit to supine and moderate assistance with  trunk for supine to sit   FUNCTIONAL TESTS:  2 MWT 138 ft with RW; 01/06/2022 180 ft with SPC 5 x  sit to stand 31 sec with Ue's to assist up to stand, 24 sec using Ue's to assist    GAIT: Distance walked: 138 ft; 01/06/2022 180 ft with [facility] Assistive device utilized: Environmental consultant - 2 wheeled; 01/06/2022 with SPC Level of assistance: SBA Comments: mildly SOB after walking       TODAY'S TREATMENT: 01/23/2022 Alt step taps 4 inch  x 20 int HHA Lunges onto 4"step no UE 10X each working on stability Tandem x 30" on foam balance beam  Gait over 3 6" hurdles in // bar with 2 UE assist 2 RT   01/16/22   Gait around clinic 200 feet with SPC Alt step taps 4 inch  x 20 int HHA Lunges onto 4"step no UE 10X each working on stability Tandem x 30" on foam balance beam with 1# UE flexion off // bars 10X each LE lead  Vectors 5X5" each with 1 HHA Gait over 3 6" hurdles in // bar with 1 UE assist 3RT  01/06/2022 Progress note Gait: 2MWT 180 ft with SPC and SBA 5 x sit to stand test with Ue's to assist up to stand 24 sec Updated HEP Exercises - Standing Toe Taps  - 2-3 x daily - 7 x weekly - 1 sets - 10 reps - Standing Tandem Balance with Counter Support  - 2-3 x daily - 7 x weekly - 3 sets - 3 reps - 30" hold   01/02/22 Nustep 4 minutes lv3 UE/LE seat 12 Alt step taps 4 inch  x 20 int HHA Tandem stance x30" solid floor Tandem x 30" on foam  Gait with head turn 30 feet Gait over obstacles 30 feet    PATIENT EDUCATION:  Education details: HEP instruction, disease progression, balance impairments 12/16/21 on therapy goals, exercise form and function  Person educated: Patient Education method: Explanation, Demonstration, and Handouts Education comprehension: verbalized understanding, returned  demonstration, and needs further education     HOME EXERCISE PROGRAM: Access Code: [email or web address] URL: [email or web address] Date: 01/06/2022 Prepared by: [facility] - Rehab  Exercises - Standing Toe Taps  - 2-3 x daily - 7 x weekly - 1 sets - 10 reps - Standing Tandem Balance with Counter Support  - 2-3 x daily - 7 x weekly - 3 sets - 3 reps - 30" hold  Access Code: [email or web address] URL: [email or web address] Date: 12/09/2021 Prepared by: [facility] - Rehab   Exercises - Seated Heel Toe Raises  - 2 x daily - 7 x weekly - 1 sets - 10 reps - Seated Long Arc Quad  - 2 x daily - 7 x weekly - 1 sets - 10 reps - Seated March  - 2 x daily - 7 x weekly - 1 sets - 10 reps - Supine Active Straight Leg Raise  - 7 x weekly - 1 sets - 10 reps   ASSESSMENT:   CLINICAL IMPRESSION: Tightness in legs build up as the end of week arrives.  Patient mildly SOB today needs frequent rest breaks. Still has trouble with weakness in legs Right more than left. She reports general SOB today more than usual. Needs CGA for safety with all balance activities.  Pt will continue to benefit from skilled physical therapy to improve strength and stability.    OBJECTIVE IMPAIRMENTS Abnormal gait, decreased activity tolerance, decreased balance, decreased coordination, decreased endurance, decreased mobility, difficulty walking, decreased  ROM, decreased strength, decreased safety awareness, hypomobility, increased edema, impaired perceived functional ability, impaired flexibility, impaired sensation, improper body mechanics, and pain.    ACTIVITY LIMITATIONS cleaning, community activity, driving, meal prep, laundry, medication management, yard work, and shopping.    PERSONAL FACTORS Fitness and 1-2 comorbidities: liver cirrhosis and DM  are also affecting patient's functional outcome.      REHAB POTENTIAL: Fair secondary to progressive liver disease   CLINICAL DECISION MAKING: Stable/uncomplicated   EVALUATION  COMPLEXITY: Low     GOALS: Goals reviewed with patient? No   SHORT TERM GOALS: Target date: 12/23/2021     Patient with be independent with initial HEP to  improve functional outcomes.   Goal status: met   2.  Patient will increased her 2MWT to 155 ft to demonstrate improved functional mobility and easier access to her home enviroment Baseline: 138 ft, 01/06/2022 180 ft  Goal status:  met     LONG TERM GOALS: Target date: 02/08/2022   Patient will be independent with advanced HEP and self management strategies to improve quality of life and functional outcomes.   Goal status:  Ongoing   2.  Patient will improve her 5 times STS score from 31 sec to 27 sec or less to demonstrate improved functional mobility and increased lower extremity strength Baseline: 31 sec, 01/06/2022 24 sec Goal status:  met   3.  Patient will increased her 2MWT to 185 ft to demonstrate improved functional mobility and easier access to community Baseline: 138 ft, 01/06/2022 180 ft with SPC Goal status:  Ongoing   4.  Patient will be able to perform supine to sit and sit to supine modified independently to increase patient independence at home. Baseline: mod assist for supine to sit and sit to supine Goal status:  Ongoing   5.  Patient will report at least 50% improvement in overall symptoms and/or function to demonstrate improved functional mobility   Baseline:  Goal status:  Ongoing       PLAN: PT FREQUENCY: 2x/week   PT DURATION: 4 more weeks   PLANNED INTERVENTIONS: Therapeutic exercises, Therapeutic activity, Neuromuscular re-education, Balance training, Gait training, Patient/Family education, Joint manipulation, Joint mobilization, Stair training, Vestibular training, Orthotic/Fit training, DME instructions, Aquatic Therapy, Dry Needling, Electrical stimulation, Spinal manipulation, Spinal mobilization, Cryotherapy, Moist heat, Compression bandaging, Taping, Traction, Ultrasound, Contrast bath,  Biofeedback, and Manual therapy   PLAN FOR NEXT SESSION:  Continue to address remaining unmet and partially met goals; work on balance.   3:20 PM, 01/23/22 Raileigh Sabater Small Rheagan Nayak MPT [facility] [place] [phone]

## 2022-01-27 ENCOUNTER — Ambulatory Visit (HOSPITAL_COMMUNITY): Payer: 59

## 2022-01-27 ENCOUNTER — Telehealth (HOSPITAL_COMMUNITY): Payer: Self-pay | Admitting: *Deleted

## 2022-01-27 ENCOUNTER — Observation Stay (HOSPITAL_COMMUNITY): Payer: 59

## 2022-01-27 ENCOUNTER — Inpatient Hospital Stay (HOSPITAL_COMMUNITY)
Admission: EM | Admit: 2022-01-27 | Discharge: 2022-01-30 | DRG: 812 | Disposition: A | Discharge status: Home / Self Care | Payer: 59 | Attending: Family Medicine | Admitting: Family Medicine

## 2022-01-27 ENCOUNTER — Other Ambulatory Visit (HOSPITAL_COMMUNITY): Payer: Self-pay | Admitting: *Deleted

## 2022-01-27 ENCOUNTER — Other Ambulatory Visit: Payer: Self-pay

## 2022-01-27 ENCOUNTER — Inpatient Hospital Stay (HOSPITAL_COMMUNITY): Payer: 59

## 2022-01-27 ENCOUNTER — Encounter (HOSPITAL_COMMUNITY): Payer: Self-pay | Admitting: Emergency Medicine

## 2022-01-27 DIAGNOSIS — D649 Anemia, unspecified: Secondary | ICD-10-CM | POA: Diagnosis not present

## 2022-01-27 DIAGNOSIS — E1169 Type 2 diabetes mellitus with other specified complication: Secondary | ICD-10-CM

## 2022-01-27 DIAGNOSIS — N179 Acute kidney failure, unspecified: Secondary | ICD-10-CM | POA: Diagnosis present

## 2022-01-27 DIAGNOSIS — D62 Acute posthemorrhagic anemia: Principal | ICD-10-CM | POA: Diagnosis present

## 2022-01-27 DIAGNOSIS — M6281 Muscle weakness (generalized): Secondary | ICD-10-CM | POA: Insufficient documentation

## 2022-01-27 DIAGNOSIS — K7460 Unspecified cirrhosis of liver: Secondary | ICD-10-CM | POA: Insufficient documentation

## 2022-01-27 DIAGNOSIS — K642 Third degree hemorrhoids: Secondary | ICD-10-CM | POA: Diagnosis present

## 2022-01-27 DIAGNOSIS — R0602 Shortness of breath: Secondary | ICD-10-CM | POA: Diagnosis not present

## 2022-01-27 DIAGNOSIS — F32A Depression, unspecified: Secondary | ICD-10-CM | POA: Diagnosis present

## 2022-01-27 DIAGNOSIS — G4733 Obstructive sleep apnea (adult) (pediatric): Secondary | ICD-10-CM | POA: Diagnosis present

## 2022-01-27 DIAGNOSIS — Z8616 Personal history of COVID-19: Secondary | ICD-10-CM

## 2022-01-27 DIAGNOSIS — D6959 Other secondary thrombocytopenia: Secondary | ICD-10-CM | POA: Diagnosis present

## 2022-01-27 DIAGNOSIS — R062 Wheezing: Secondary | ICD-10-CM | POA: Diagnosis not present

## 2022-01-27 DIAGNOSIS — E785 Hyperlipidemia, unspecified: Secondary | ICD-10-CM | POA: Diagnosis present

## 2022-01-27 DIAGNOSIS — K644 Residual hemorrhoidal skin tags: Secondary | ICD-10-CM | POA: Diagnosis present

## 2022-01-27 DIAGNOSIS — D509 Iron deficiency anemia, unspecified: Secondary | ICD-10-CM | POA: Diagnosis present

## 2022-01-27 DIAGNOSIS — E119 Type 2 diabetes mellitus without complications: Secondary | ICD-10-CM

## 2022-01-27 DIAGNOSIS — K7581 Nonalcoholic steatohepatitis (NASH): Secondary | ICD-10-CM | POA: Diagnosis present

## 2022-01-27 DIAGNOSIS — R262 Difficulty in walking, not elsewhere classified: Secondary | ICD-10-CM

## 2022-01-27 DIAGNOSIS — E1122 Type 2 diabetes mellitus with diabetic chronic kidney disease: Secondary | ICD-10-CM | POA: Diagnosis present

## 2022-01-27 DIAGNOSIS — Z807 Family history of other malignant neoplasms of lymphoid, hematopoietic and related tissues: Secondary | ICD-10-CM

## 2022-01-27 DIAGNOSIS — E1140 Type 2 diabetes mellitus with diabetic neuropathy, unspecified: Secondary | ICD-10-CM | POA: Diagnosis present

## 2022-01-27 DIAGNOSIS — K766 Portal hypertension: Secondary | ICD-10-CM | POA: Diagnosis present

## 2022-01-27 DIAGNOSIS — Z8542 Personal history of malignant neoplasm of other parts of uterus: Secondary | ICD-10-CM

## 2022-01-27 DIAGNOSIS — D61818 Other pancytopenia: Secondary | ICD-10-CM | POA: Diagnosis present

## 2022-01-27 DIAGNOSIS — Z6838 Body mass index (BMI) 38.0-38.9, adult: Secondary | ICD-10-CM

## 2022-01-27 DIAGNOSIS — Z803 Family history of malignant neoplasm of breast: Secondary | ICD-10-CM

## 2022-01-27 DIAGNOSIS — R188 Other ascites: Secondary | ICD-10-CM | POA: Diagnosis present

## 2022-01-27 DIAGNOSIS — Z8051 Family history of malignant neoplasm of kidney: Secondary | ICD-10-CM

## 2022-01-27 DIAGNOSIS — Z833 Family history of diabetes mellitus: Secondary | ICD-10-CM

## 2022-01-27 DIAGNOSIS — G4700 Insomnia, unspecified: Secondary | ICD-10-CM | POA: Diagnosis present

## 2022-01-27 DIAGNOSIS — Z96611 Presence of right artificial shoulder joint: Secondary | ICD-10-CM | POA: Diagnosis present

## 2022-01-27 DIAGNOSIS — K922 Gastrointestinal hemorrhage, unspecified: Secondary | ICD-10-CM | POA: Diagnosis present

## 2022-01-27 DIAGNOSIS — Z8249 Family history of ischemic heart disease and other diseases of the circulatory system: Secondary | ICD-10-CM

## 2022-01-27 DIAGNOSIS — R2689 Other abnormalities of gait and mobility: Secondary | ICD-10-CM | POA: Insufficient documentation

## 2022-01-27 DIAGNOSIS — E669 Obesity, unspecified: Secondary | ICD-10-CM | POA: Diagnosis present

## 2022-01-27 DIAGNOSIS — K7469 Other cirrhosis of liver: Secondary | ICD-10-CM | POA: Diagnosis present

## 2022-01-27 DIAGNOSIS — N1832 Chronic kidney disease, stage 3b: Secondary | ICD-10-CM | POA: Diagnosis present

## 2022-01-27 DIAGNOSIS — D563 Thalassemia minor: Secondary | ICD-10-CM | POA: Diagnosis present

## 2022-01-27 DIAGNOSIS — K7290 Hepatic failure, unspecified without coma: Secondary | ICD-10-CM | POA: Diagnosis present

## 2022-01-27 LAB — CBC WITH DIFFERENTIAL/PLATELET
Abs Immature Granulocytes: 0.02 10*3/uL (ref 0.00–0.07)
Abs Immature Granulocytes: 0.02 10*3/uL (ref 0.00–0.07)
Basophils Absolute: 0 10*3/uL (ref 0.0–0.1)
Basophils Absolute: 0 10*3/uL (ref 0.0–0.1)
Basophils Relative: 0 %
Basophils Relative: 0 %
Eosinophils Absolute: 0.2 10*3/uL (ref 0.0–0.5)
Eosinophils Absolute: 0.2 10*3/uL (ref 0.0–0.5)
Eosinophils Relative: 5 %
Eosinophils Relative: 5 %
HCT: 18.3 % — ABNORMAL LOW (ref 36.0–46.0)
HCT: 18.5 % — ABNORMAL LOW (ref 36.0–46.0)
Hemoglobin: 5.4 g/dL — CL (ref 12.0–15.0)
Hemoglobin: 5.6 g/dL — CL (ref 12.0–15.0)
Immature Granulocytes: 1 %
Immature Granulocytes: 1 %
Lymphocytes Relative: 18 %
Lymphocytes Relative: 19 %
Lymphs Abs: 0.8 10*3/uL (ref 0.7–4.0)
Lymphs Abs: 0.8 10*3/uL (ref 0.7–4.0)
MCH: 18.5 pg — ABNORMAL LOW (ref 26.0–34.0)
MCH: 19 pg — ABNORMAL LOW (ref 26.0–34.0)
MCHC: 29.5 g/dL — ABNORMAL LOW (ref 30.0–36.0)
MCHC: 30.3 g/dL (ref 30.0–36.0)
MCV: 62.7 fL — ABNORMAL LOW (ref 80.0–100.0)
MCV: 62.9 fL — ABNORMAL LOW (ref 80.0–100.0)
Monocytes Absolute: 0.6 10*3/uL (ref 0.1–1.0)
Monocytes Absolute: 0.4 10*3/uL (ref 0.1–1.0)
Monocytes Relative: 13 %
Monocytes Relative: 10 %
Neutro Abs: 2.7 10*3/uL (ref 1.7–7.7)
Neutro Abs: 2.7 10*3/uL (ref 1.7–7.7)
Neutrophils Relative %: 63 %
Neutrophils Relative %: 65 %
Platelets: 161 10*3/uL (ref 150–400)
Platelets: 167 10*3/uL (ref 150–400)
RBC: 2.92 MIL/uL — ABNORMAL LOW (ref 3.87–5.11)
RBC: 2.94 MIL/uL — ABNORMAL LOW (ref 3.87–5.11)
RDW: 20.1 % — ABNORMAL HIGH (ref 11.5–15.5)
RDW: 20.2 % — ABNORMAL HIGH (ref 11.5–15.5)
WBC: 4.3 10*3/uL (ref 4.0–10.5)
WBC: 4.1 10*3/uL (ref 4.0–10.5)
nRBC: 0.5 % — ABNORMAL HIGH (ref 0.0–0.2)
nRBC: 0 % (ref 0.0–0.2)

## 2022-01-27 LAB — PROTIME-INR
INR: 1.2 (ref 0.8–1.2)
Prothrombin Time: 14.6 seconds (ref 11.4–15.2)

## 2022-01-27 LAB — BASIC METABOLIC PANEL
Anion gap: 5 (ref 5–15)
BUN: 37 mg/dL — ABNORMAL HIGH (ref 8–23)
CO2: 20 mmol/L — ABNORMAL LOW (ref 22–32)
Calcium: 8.4 mg/dL — ABNORMAL LOW (ref 8.9–10.3)
Chloride: 111 mmol/L (ref 98–111)
Creatinine, Ser: 1.73 mg/dL — ABNORMAL HIGH (ref 0.44–1.00)
GFR, Estimated: 33 mL/min — ABNORMAL LOW (ref >60)
Glucose, Bld: 183 mg/dL — ABNORMAL HIGH (ref 70–99)
Potassium: 4.8 mmol/L (ref 3.5–5.1)
Sodium: 136 mmol/L (ref 135–145)

## 2022-01-27 LAB — POC OCCULT BLOOD, ED

## 2022-01-27 LAB — PREPARE RBC (CROSSMATCH)

## 2022-01-27 LAB — SAMPLE TO BLOOD BANK

## 2022-01-27 MED ORDER — SODIUM CHLORIDE 0.9 % IV SOLN: 10.0000 mL/h | Freq: Once | INTRAVENOUS | Status: DC

## 2022-01-27 MED ORDER — OXYCODONE HCL 5 MG PO TABS: 5.0000 mg | ORAL_TABLET | ORAL | Status: DC | PRN

## 2022-01-27 MED ORDER — SPIRONOLACTONE 25 MG PO TABS
100.0000 mg | ORAL_TABLET | Freq: Every day | ORAL | Status: DC
Start: 2022-01-28 — End: 2022-01-27

## 2022-01-27 MED ORDER — INSULIN ASPART 100 UNIT/ML IJ SOLN
0.0000 [IU] | Freq: Every day | INTRAMUSCULAR | Status: DC
  Administered 2022-01-28: 5 [IU] via SUBCUTANEOUS

## 2022-01-27 MED ORDER — DIPHENHYDRAMINE HCL 25 MG PO CAPS: 25.0000 mg | ORAL_CAPSULE | Freq: Every evening | ORAL | Status: DC | PRN

## 2022-01-27 MED ORDER — INSULIN ASPART 100 UNIT/ML IJ SOLN
0.0000 [IU] | Freq: Three times a day (TID) | INTRAMUSCULAR | Status: DC
  Administered 2022-01-28: 3 [IU] via SUBCUTANEOUS
  Administered 2022-01-28 – 2022-01-30 (×2): 2 [IU] via SUBCUTANEOUS

## 2022-01-27 MED ORDER — ONDANSETRON HCL 4 MG PO TABS: 4.0000 mg | ORAL_TABLET | Freq: Four times a day (QID) | ORAL | Status: DC | PRN

## 2022-01-27 MED ORDER — GABAPENTIN 400 MG PO CAPS
800.0000 mg | ORAL_CAPSULE | Freq: Every day | ORAL | Status: DC
  Administered 2022-01-28: 800 mg via ORAL
  Filled 2022-01-27: qty 2

## 2022-01-27 MED ORDER — ACETAMINOPHEN 650 MG RE SUPP: 650.0000 mg | Freq: Four times a day (QID) | RECTAL | Status: DC | PRN

## 2022-01-27 MED ORDER — SERTRALINE HCL 50 MG PO TABS
50.0000 mg | ORAL_TABLET | Freq: Every day | ORAL | Status: DC
  Administered 2022-01-28 – 2022-01-30 (×3): 50 mg via ORAL
  Filled 2022-01-27 (×3): qty 1

## 2022-01-27 MED ORDER — ONDANSETRON HCL 4 MG/2ML IJ SOLN: 4.0000 mg | Freq: Four times a day (QID) | INTRAMUSCULAR | Status: DC | PRN

## 2022-01-27 MED ORDER — ATORVASTATIN CALCIUM 10 MG PO TABS
10.0000 mg | ORAL_TABLET | Freq: Every day | ORAL | Status: DC
  Administered 2022-01-28 – 2022-01-30 (×3): 10 mg via ORAL
  Filled 2022-01-27 (×3): qty 1

## 2022-01-27 MED ORDER — MORPHINE SULFATE (PF) 2 MG/ML IV SOLN: 2.0000 mg | INTRAVENOUS | Status: DC | PRN

## 2022-01-27 MED ORDER — ACETAMINOPHEN 325 MG PO TABS
650.0000 mg | ORAL_TABLET | Freq: Four times a day (QID) | ORAL | Status: DC | PRN
  Administered 2022-01-28 – 2022-01-29 (×2): 650 mg via ORAL
  Filled 2022-01-27 (×2): qty 2

## 2022-01-27 MED ORDER — HYDROCORTISONE ACETATE 25 MG RE SUPP
25.0000 mg | Freq: Two times a day (BID) | RECTAL | Status: DC | PRN
Start: 2022-01-27 — End: 2022-01-30

## 2022-01-27 MED ORDER — MELATONIN 3 MG PO TABS
6.0000 mg | ORAL_TABLET | Freq: Every evening | ORAL | Status: DC | PRN
  Administered 2022-01-29: 6 mg via ORAL
  Filled 2022-01-27: qty 2

## 2022-01-27 MED ORDER — GABAPENTIN 400 MG PO CAPS: 800.0000 mg | ORAL_CAPSULE | Freq: Two times a day (BID) | ORAL | Status: DC

## 2022-01-27 NOTE — ED Triage Notes (Signed)
[place] pt has labs drawn this afternoon Hgb 5.2, pt has history of Thalassemia.

## 2022-01-27 NOTE — Progress Notes (Unsigned)
CRITICAL VALUE ALERT Critical value received:  hemoglobin 5.4 Date of notification:  01-27-22 Time of notification: 1618 Critical value read back:  Yes.   Nurse who received alert:  C. Caylan Schifano RN MD notified time and response:  0221, call pt. To go to the Emergency room. RN to call .

## 2022-01-27 NOTE — Telephone Encounter (Signed)
Patient called to advise that she has had significant rectal bleeding, bright red x 5 days.  Will have her come in for CBC/BB tube.  Encouraged her to also follow up with her gastroenterologist.  States that they are aware.

## 2022-01-27 NOTE — ED Provider Notes (Signed)
[facility] Provider Note   CSN: [id number] Arrival date & time: 01/27/22  1636     History  No chief complaint on file.   Rachel Huber is a 66 y.o. female.  Patient with history of iron deficiency anemia, thalassemia, cirrhosis, presents with chief complaint of anemia shortness of breath and fatigue.  She states her hemoglobin is trending downwards for the last several days.  Typically is around 7-8 but recently has been in the 6 region and was told to go to the ER.  She states she has a history of hemorrhoids and oftentimes gets rectal bleeding from her hemorrhoids which has not changed much in the last several days.  She noticed bright red bleeding in her stool today but has been doing well for the last few days until today.  Otherwise denies headache or chest pain denies abdominal pain.  No fever no cough no vomiting or diarrhea.  Her hematologist is at [facility].       Home Medications Prior to Admission medications   Medication Sig Start Date End Date Taking? Authorizing Provider  gabapentin (NEURONTIN) 400 MG capsule Take 2 capsules by mouth 2 (two) times daily. 01/21/22  Yes [provider]  acetaminophen (TYLENOL) 650 MG CR tablet Take 1,300 mg by mouth daily as needed for pain.    [provider]  atorvastatin (LIPITOR) 10 MG tablet Take 10 mg by mouth daily. 01/28/21   [provider]  azelastine (ASTELIN) 0.1 % nasal spray Place into the nose. 09/03/21 09/03/22  [provider]  Cholecalciferol 50 MCG (2000 UT) CAPS Take by mouth.    [provider]  ciprofloxacin (CIPRO) 500 MG tablet Take 500 mg by mouth daily. 12/19/21   [provider]  diphenhydrAMINE (BENADRYL) 25 MG tablet Take 25 mg by mouth at bedtime as needed.    [provider]  Dulaglutide 4.5 MG/0.5ML SOPN Inject into the skin. 09/25/21   [provider]  furosemide (LASIX) 20 MG tablet Take by mouth. 02/06/21 05/07/21  [provider]  gabapentin (NEURONTIN) 400 MG capsule Take 800 mg by mouth daily at 2 am. 01/07/18   [provider]  guaiFENesin-dextromethorphan (ROBITUSSIN DM) 100-10 MG/5ML syrup Take 5 mLs by mouth every 4 (four) hours as needed for cough. 09/13/20   Aline August, MD  hydrocortisone (ANUSOL-HC) 25 MG suppository Place rectally. 10/11/20   [provider]  melatonin 5 MG TABS Take 5 mg by mouth at bedtime as needed (sleep).    [provider]  methocarbamol (ROBAXIN) 500 MG tablet Take 1 tablet (500 mg total) by mouth every 8 (eight) hours as needed for muscle spasms. 09/13/20   Aline August, MD  Multiple Vitamins-Minerals (MULTIVITAMIN WITH MINERALS) tablet Take 1 tablet by mouth daily.    [provider]  ondansetron (ZOFRAN-ODT) 4 MG disintegrating tablet Take by mouth. 12/11/20   [provider]  oxyCODONE-acetaminophen (PERCOCET/ROXICET) 5-325 MG tablet Take 1 tablet by mouth every 6 (six) hours as needed for severe pain. 09/13/20   Aline August, MD  oxymetazoline (AFRIN) 0.05 % nasal spray Place 1 spray into both nostrils 2 (two) times daily as needed.    [provider]  sertraline (ZOLOFT) 50 MG tablet Take 50 mg by mouth daily. 12/12/17   [provider]  spironolactone (ALDACTONE) 100 MG tablet Take by mouth. 02/06/21   [provider]  TRULICITY 4.5 KV/4.2VZ SOPN Inject 4.5 mg into the skin every Wednesday.  [provider]      Allergies    Latex    Review of Systems   Review of Systems  Constitutional:  Negative for fever.  HENT:  Negative for ear pain.   Eyes:  Negative for pain.  Respiratory:  Negative for cough.   Cardiovascular:  Negative for chest pain.  Gastrointestinal:  Negative for abdominal pain.  Genitourinary:  Negative for flank pain.  Musculoskeletal:  Negative for back pain.  Skin:  Negative for rash.  Neurological:  Negative for headaches.    Physical Exam Updated Vital  Signs BP (!) 115/45   Pulse 92   Temp 98.3 F (36.8 C) (Oral)   Resp 18   Ht 5' 9"  (1.753 m)   Wt 117.9 kg   SpO2 98%   BMI 38.40 kg/m  Physical Exam Constitutional:      General: She is not in acute distress.    Appearance: Normal appearance.  HENT:     Head: Normocephalic.     Nose: Nose normal.  Eyes:     Extraocular Movements: Extraocular movements intact.  Cardiovascular:     Rate and Rhythm: Normal rate.  Pulmonary:     Effort: Pulmonary effort is normal.  Abdominal:     Comments: Moderately distended abdomen, no guarding or rebound noted.  Genitourinary:    Comments: Brown/orange appearing stool.  Guaiac negative. Musculoskeletal:        General: Normal range of motion.     Cervical back: Normal range of motion.  Neurological:     General: No focal deficit present.     Mental Status: She is alert. Mental status is at baseline.     ED Results / Procedures / Treatments   Labs (all labs ordered are listed, but only abnormal results are displayed) Labs Reviewed  CBC WITH DIFFERENTIAL/PLATELET - Abnormal; Notable for the following components:      Result Value   RBC 2.94 (*)    Hemoglobin 5.6 (*)    HCT 18.5 (*)    MCV 62.9 (*)    MCH 19.0 (*)    RDW 20.2 (*)    All other components within normal limits  BASIC METABOLIC PANEL - Abnormal; Notable for the following components:   CO2 20 (*)    Glucose, Bld 183 (*)    BUN 37 (*)    Creatinine, Ser 1.73 (*)    Calcium 8.4 (*)    GFR, Estimated 33 (*)    All other components within normal limits  PROTIME-INR  POC OCCULT BLOOD, ED  TYPE AND SCREEN  PREPARE RBC (CROSSMATCH)    EKG None  Radiology No results found.  Procedures .Critical Care  Performed by: Luna Fuse, MD Authorized by: Luna Fuse, MD   Critical care provider statement:    Critical care time (minutes):  40   Critical care time was exclusive of:  Separately billable procedures and treating other patients and teaching time    Critical care was necessary to treat or prevent imminent or life-threatening deterioration of the following conditions:  Circulatory failure Comments:     Severe anemia requiring emergent blood transfusion     Medications Ordered in ED Medications  0.9 %  sodium chloride infusion (has no administration in time range)    ED Course/ Medical Decision Making/ A&P                           Medical Decision Making  Amount and/or Complexity of Data Reviewed Labs: ordered.  Risk Prescription drug management.   Review of external records shows recent paracenteses Jan 14, 2022 and iron infusion January 17, 2022.  History obtained from family at bedside.  Cardiac monitor showing sinus rhythm.   Diagnostic studies sent, creatinine 1.7 slightly elevated from her baseline.  Labs otherwise show hemoglobin 5.6.  Stool occult blood is negative.  Given these findings and her symptoms patient ordered for 2 units of blood to be transfused.  Hospitalist consulted for admission.         Final Clinical Impression(s) / ED Diagnoses Final diagnoses:  Severe anemia    Rx / DC Orders ED Discharge Orders     None         Luna Fuse, MD 01/27/22 1857

## 2022-01-27 NOTE — Assessment & Plan Note (Addendum)
-   Creatinine baseline 0.9 to -Creatinine slowly trending down -Secondary to anemia -Transfuse and trend in the a.m.

## 2022-01-27 NOTE — ED Notes (Signed)
Date and time results received: 01/27/22 6:34 PM    Test: Hbg Critical Value: 5.6  Name of Provider Notified: Dr. Almyra Free  Orders Received? Or Actions Taken?: see orders

## 2022-01-27 NOTE — Therapy (Signed)
OUTPATIENT PHYSICAL THERAPY TREATMENT  Patient Name: Rachel Huber MRN: [id number] DOB:Feb 17, 1956, 66 y.o., female Today's Date: 01/27/2022  PCP: Frazier Richards MD  REFERRING PROVIDER: Eulis Canner, MD   END OF SESSION:   PT End of Session - 01/27/22 1444     Visit Number 9    Number of Visits 14    Date for PT Re-Evaluation 01/08/22    Authorization Type [employer]; 35 visit limit    Authorization - Visit Number 9    Authorization - Number of Visits 35    PT Start Time 0240    PT Stop Time 0325    PT Time Calculation (min) 45 min    Activity Tolerance Patient tolerated treatment well    Behavior During Therapy WFL for tasks assessed/performed                 Past Medical History:  Diagnosis Date   Anxiety    Arthritis    Cirrhosis of liver ([place])    Diabetes mellitus without complication ([place])    Dyspnea    DOE   GERD (gastroesophageal reflux disease)    Hepatitis    PAST   Hypertension    Neuropathy    Neuropathy, diabetic ([place])    Pneumonia    Sinus complaint    Sleep apnea    CPAP   Thalassemia minor 1992   Past Surgical History:  Procedure Laterality Date   ABDOMINAL HYSTERECTOMY     BREAST BIOPSY Right 02/15/2018   [place] bx 6-6:30 ribbon shape, ONE CORE FRAGMENT WITH FIBROSIS. ONE CORE FRAGMENT WITH PORTION OF A DILATED   BREAST BIOPSY Right 02/15/2018   [place] bx 9:00 heart shape, USUAL DUCTAL HYPERPLASIA   BREAST LUMPECTOMY Right 03/09/2018   Procedure: BREAST LUMPECTOMY x 2;  Surgeon: Benjamine Sprague, DO;  Location: [facility] ORS;  Service: General;  Laterality: Right;   CATARACT EXTRACTION W/PHACO Left 06/11/2016   Procedure: CATARACT EXTRACTION PHACO AND INTRAOCULAR LENS PLACEMENT (IOC);  Surgeon: Estill Cotta, MD;  Location: [facility] ORS;  Service: Ophthalmology;  Laterality: Left;  Lot # [id number] H US:01:38.6 AP%:26.4 CDE:44.15   CATARACT EXTRACTION W/PHACO Right 06/15/2018   Procedure: CATARACT EXTRACTION PHACO AND  INTRAOCULAR LENS PLACEMENT (IOC);  Surgeon: Birder Robson, MD;  Location: [facility] ORS;  Service: Ophthalmology;  Laterality: Right;  [place] 00:38.2 CDE 4.23 Fluid Pack Lot # [id number]   COLONOSCOPIES     COLONOSCOPY WITH PROPOFOL N/A 10/10/2020   Procedure: COLONOSCOPY WITH PROPOFOL;  Surgeon: Lesly Rubenstein, MD;  Location: [facility] ENDOSCOPY;  Service: Endoscopy;  Laterality: N/A;   COLONOSCOPY WITH PROPOFOL N/A 11/20/2020   Procedure: COLONOSCOPY WITH PROPOFOL;  Surgeon: Lesly Rubenstein, MD;  Location: [facility] ENDOSCOPY;  Service: Endoscopy;  Laterality: N/A;  DM STAT CBC, BMP COVID POSITIVE 09/02/2020   CTR     ESOPHAGOGASTRODUODENOSCOPY (EGD) WITH PROPOFOL N/A 10/10/2020   Procedure: ESOPHAGOGASTRODUODENOSCOPY (EGD) WITH PROPOFOL;  Surgeon: Lesly Rubenstein, MD;  Location: [facility] ENDOSCOPY;  Service: Endoscopy;  Laterality: N/A;  COVID POSITIVE 10/08/2020   IR PARACENTESIS  12/17/2021   JOINT REPLACEMENT     KNEE SURGERY Right 09/24/2017   plates and pins   TOTAL SHOULDER ARTHROPLASTY Right 09/27/2015   Patient Active Problem List   Diagnosis Date Noted   Iron deficiency anemia due to chronic blood loss 02/13/2021   History of uterine cancer 02/13/2021   Decompensated hepatic cirrhosis ([place]) 10/25/2020   OSA (obstructive sleep apnea) 10/25/2020   Glossitis 10/25/2020   Grade  IV internal hemorrhoids    Acute blood loss anemia 10/08/2020   Hyponatremia 10/08/2020   Rectal bleeding 10/08/2020   Acute respiratory failure with hypoxia ([place]) 09/10/2020   Pneumonia due to COVID-19 virus 09/08/2020   Hip fracture, right ([place]) 09/04/2020   Closed right hip fracture ([place]) 09/02/2020   Diabetes mellitus with hyperglycemia ([place]) 09/02/2020   Hypertension    Thalassemia minor    Pancytopenia ([place])    HLD (hyperlipidemia)    Acute renal failure superimposed on stage 3a chronic kidney disease ([place])    Depression    Fall at home, initial encounter    Nondisplaced fracture of greater trochanter  of right femur, initial encounter for closed fracture ([place])     REFERRING DIAG: PT eval/tx for gait difficulty, R26.9   THERAPY DIAG:  Difficulty in walking, not elsewhere classified  Cirrhosis, non-alcoholic (HCC)  Muscle weakness (generalized)  Other abnormalities of gait and mobility PERTINENT HISTORY: Trying to get on liver transplant list, DM R shoulder THA Feb 2019 R knee pain (OA)   PRECAUTIONS: Fall  SUBJECTIVE: Patient reports she is sore and tired today. Having paracentesis tomorrow so legs swollen today.   PAIN:  Are you having pain? Yes: NPRS scale: 4-5/10 Pain location: skin on legs, low back Pain description: sensitive Aggravating factors:prolonged standing (back) Relieving factors: rest  OBJECTIVE:    COGNITION:           Overall cognitive status: Within functional limits for tasks assessed                          SENSATION: Light touch: hypersensitive to touch   POSTURE:  Forward flexed at the trunk with standing and walking; noted distended abdomen and swelling in legs.    PALPATION: Tightness, swelling noted bilateral lower extremities   LE ROM:   Active ROM Right 12/09/2021 Left 12/09/2021 Right 01/06/2022 Left 01/06/2022  Hip flexion        Hip extension        Hip abduction        Hip adduction        Hip internal rotation        Hip external rotation        Knee flexion        Knee extension        Ankle dorsiflexion limited limited wfl wfl  Ankle plantarflexion        Ankle inversion        Ankle eversion         (Blank rows = not tested)   LE MMT:   MMT Right 12/09/2021 Left 12/09/2021 Right 01/06/2022 Left 01/06/2022  Hip flexion 4- 4- 4+ 4+  Hip extension        Hip abduction        Hip adduction        Hip internal rotation        Hip external rotation        Knee flexion(sitting) 4 4- Sitting 4+ Sitting 4+  Knee extension 4+ 4 4+ 4+  Ankle dorsiflexion 2+ 2+ 4 4  Ankle plantarflexion        Ankle inversion         Ankle eversion         (Blank rows = not tested)   BED MOBILITY: Needs moderate Assistance with legs with sit to supine and moderate assistance with trunk for supine to sit   FUNCTIONAL TESTS:  2 MWT 138 ft with RW; 01/06/2022 180 ft with SPC 5 x  sit to stand 31 sec with Ue's to assist up to stand, 24 sec using Ue's to assist    GAIT: Distance walked: 138 ft; 01/06/2022 180 ft with [facility] Assistive device utilized: Environmental consultant - 2 wheeled; 01/06/2022 with SPC Level of assistance: SBA Comments: mildly SOB after walking       TODAY'S TREATMENT: 01/27/2022 Nustep 5 minutes lv3 UE/LE seat 12 Alt step taps 4 inch  x 20 int HHA Lunges onto 4"step no UE 10X each working on stability Tandem x 30" on foam balance beam each    01/23/2022 Alt step taps 4 inch  x 20 int HHA Lunges onto 4"step no UE 10X each working on stability Tandem x 30" on foam balance beam  Gait over 3 6" hurdles in // bar with 2 UE assist 2 RT   01/16/22   Gait around clinic 200 feet with SPC Alt step taps 4 inch  x 20 int HHA Lunges onto 4"step no UE 10X each working on stability Tandem x 30" on foam balance beam with 1# UE flexion off // bars 10X each LE lead  Vectors 5X5" each with 1 HHA Gait over 3 6" hurdles in // bar with 1 UE assist 3RT  01/06/2022 Progress note Gait: 2MWT 180 ft with SPC and SBA 5 x sit to stand test with Ue's to assist up to stand 24 sec Updated HEP Exercises - Standing Toe Taps  - 2-3 x daily - 7 x weekly - 1 sets - 10 reps - Standing Tandem Balance with Counter Support  - 2-3 x daily - 7 x weekly - 3 sets - 3 reps - 30" hold   01/02/22 Nustep 4 minutes lv3 UE/LE seat 12 Alt step taps 4 inch  x 20 int HHA Tandem stance x30" solid floor Tandem x 30" on foam  Gait with head turn 30 feet Gait over obstacles 30 feet    PATIENT EDUCATION:  Education details: HEP instruction, disease progression, balance impairments 12/16/21 on therapy goals, exercise form and function  Person educated:  Patient Education method: Explanation, Demonstration, and Handouts Education comprehension: verbalized understanding, returned demonstration, and needs further education     HOME EXERCISE PROGRAM: Access Code: [email or web address] URL: [email or web address] Date: 01/06/2022 Prepared by: [facility] - Rehab  Exercises - Standing Toe Taps  - 2-3 x daily - 7 x weekly - 1 sets - 10 reps - Standing Tandem Balance with Counter Support  - 2-3 x daily - 7 x weekly - 3 sets - 3 reps - 30" hold  Access Code: [email or web address] URL: [email or web address] Date: 12/09/2021 Prepared by: AP - Rehab   Exercises - Seated Heel Toe Raises  - 2 x daily - 7 x weekly - 1 sets - 10 reps - Seated Long Arc Quad  - 2 x daily - 7 x weekly - 1 sets - 10 reps - Seated March  - 2 x daily - 7 x weekly - 1 sets - 10 reps - Supine Active Straight Leg Raise  - 7 x weekly - 1 sets - 10 reps   ASSESSMENT:   CLINICAL IMPRESSION: Today's session continued to work on lower extremity strengthening and balance.  Patient moving somewhat slower today; SOB with exertion due to increased fluid. She takes extra time for sit to stand today but able to perform with Ue's to assist pushing up.  Pt will continue to benefit from skilled physical therapy to  improve strength and stability.    OBJECTIVE IMPAIRMENTS Abnormal gait, decreased activity tolerance, decreased balance, decreased coordination, decreased endurance, decreased mobility, difficulty walking, decreased ROM, decreased strength, decreased safety awareness, hypomobility, increased edema, impaired perceived functional ability, impaired flexibility, impaired sensation, improper body mechanics, and pain.    ACTIVITY LIMITATIONS cleaning, community activity, driving, meal prep, laundry, medication management, yard work, and shopping.    PERSONAL FACTORS Fitness and 1-2 comorbidities: liver cirrhosis and DM  are also affecting patient's functional outcome.      REHAB  POTENTIAL: Fair secondary to progressive liver disease   CLINICAL DECISION MAKING: Stable/uncomplicated   EVALUATION COMPLEXITY: Low     GOALS: Goals reviewed with patient? No   SHORT TERM GOALS: Target date: 12/23/2021     Patient with be independent with initial HEP to  improve functional outcomes.   Goal status: met   2.  Patient will increased her 2MWT to 155 ft to demonstrate improved functional mobility and easier access to her home enviroment Baseline: 138 ft, 01/06/2022 180 ft  Goal status:  met     LONG TERM GOALS: Target date: 02/08/2022   Patient will be independent with advanced HEP and self management strategies to improve quality of life and functional outcomes.   Goal status:  Ongoing   2.  Patient will improve her 5 times STS score from 31 sec to 27 sec or less to demonstrate improved functional mobility and increased lower extremity strength Baseline: 31 sec, 01/06/2022 24 sec Goal status:  met   3.  Patient will increased her 2MWT to 185 ft to demonstrate improved functional mobility and easier access to community Baseline: 138 ft, 01/06/2022 180 ft with SPC Goal status:  Ongoing   4.  Patient will be able to perform supine to sit and sit to supine modified independently to increase patient independence at home. Baseline: mod assist for supine to sit and sit to supine Goal status:  Ongoing   5.  Patient will report at least 50% improvement in overall symptoms and/or function to demonstrate improved functional mobility   Baseline:  Goal status:  Ongoing       PLAN: PT FREQUENCY: 2x/week   PT DURATION: 4 more weeks   PLANNED INTERVENTIONS: Therapeutic exercises, Therapeutic activity, Neuromuscular re-education, Balance training, Gait training, Patient/Family education, Joint manipulation, Joint mobilization, Stair training, Vestibular training, Orthotic/Fit training, DME instructions, Aquatic Therapy, Dry Needling, Electrical stimulation, Spinal  manipulation, Spinal mobilization, Cryotherapy, Moist heat, Compression bandaging, Taping, Traction, Ultrasound, Contrast bath, Biofeedback, and Manual therapy   PLAN FOR NEXT SESSION:  Continue to address remaining unmet and partially met goals; work on balance.   3:33 PM, 01/27/22 Naoko Diperna Small Ilya Ess MPT [facility] [place] [phone]

## 2022-01-27 NOTE — H&P (Signed)
History and Physical    Patient: Rachel Huber QMG:[id number] DOB: 1956/06/09 DOA: 01/27/2022 DOS: the patient was seen and examined on 01/27/2022 PCP: [name], MD  Patient coming from: Home  Chief Complaint: No chief complaint on file.  HPI: Rachel Huber is a 66 y.o. 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  Patient reports that she had bleeding hemorrhoids that started 4 days ago.  He was bleeding heavier 3 and 2 days ago, but later today.  She reports she started noticing that she was to fatigued and weak to walk across the house.  She did have to walk halfway take a rest and then finish the rest of the way.  Last time that she felt the symptoms was because she was too anemic.  She reports that that was about 16 months ago.  She denies chest pain, headache, paresthesias.  She does admit to shortness of breath but its not as bothersome to her as the fatigue and weakness.  Patient has no other complaints at this time.  Patient does not smoke, does not drink alcohol, does not use illicit drugs.  She is vaccinated for COVID.  Patient is full code. Review of Systems: As mentioned in the history of present illness. All other systems reviewed and are negative. Past Medical History:  Diagnosis Date   Anxiety    Arthritis    Cirrhosis of liver ([place])    Diabetes mellitus without complication (HCC)    Dyspnea    DOE   GERD (gastroesophageal reflux disease)    Hepatitis    PAST   Hypertension    Neuropathy    Neuropathy, diabetic ([place])    Pneumonia    Sinus complaint    Sleep apnea    CPAP   Thalassemia minor 1992   Past Surgical History:  Procedure Laterality Date   ABDOMINAL HYSTERECTOMY     BREAST BIOPSY Right 02/15/2018   [place] bx 6-6:30 ribbon shape, ONE CORE FRAGMENT WITH FIBROSIS. ONE  CORE FRAGMENT WITH PORTION OF A DILATED   BREAST BIOPSY Right 02/15/2018   [place] bx 9:00 heart shape, USUAL DUCTAL HYPERPLASIA   BREAST LUMPECTOMY Right 03/09/2018   Procedure: BREAST LUMPECTOMY x 2;  Surgeon: Benjamine Sprague, DO;  Location: [facility] ORS;  Service: General;  Laterality: Right;   CATARACT EXTRACTION W/PHACO Left 06/11/2016   Procedure: CATARACT EXTRACTION PHACO AND INTRAOCULAR LENS PLACEMENT (IOC);  Surgeon: Estill Cotta, MD;  Location: [facility] ORS;  Service: Ophthalmology;  Laterality: Left;  Lot # [id number] H US:01:38.6 AP%:26.4 CDE:44.15   CATARACT EXTRACTION W/PHACO Right 06/15/2018   Procedure: CATARACT EXTRACTION PHACO AND INTRAOCULAR LENS PLACEMENT (IOC);  Surgeon: Birder Robson, MD;  Location: [facility] ORS;  Service: Ophthalmology;  Laterality: Right;  [place] 00:38.2 CDE 4.23 Fluid Pack Lot # [id number]   COLONOSCOPIES     COLONOSCOPY WITH PROPOFOL N/A 10/10/2020   Procedure: COLONOSCOPY WITH PROPOFOL;  Surgeon: Lesly Rubenstein, MD;  Location: [facility] ENDOSCOPY;  Service: Endoscopy;  Laterality: N/A;   COLONOSCOPY WITH PROPOFOL N/A 11/20/2020   Procedure: COLONOSCOPY WITH PROPOFOL;  Surgeon: Lesly Rubenstein, MD;  Location: [facility] ENDOSCOPY;  Service: Endoscopy;  Laterality: N/A;  DM STAT CBC, BMP COVID POSITIVE 09/02/2020   CTR     ESOPHAGOGASTRODUODENOSCOPY (EGD) WITH PROPOFOL N/A 10/10/2020   Procedure: ESOPHAGOGASTRODUODENOSCOPY (EGD) WITH PROPOFOL;  Surgeon: Lesly Rubenstein, MD;  Location: [facility] ENDOSCOPY;  Service: Endoscopy;  Laterality: N/A;  COVID POSITIVE 10/08/2020   IR PARACENTESIS  12/17/2021   JOINT REPLACEMENT     KNEE SURGERY Right 09/24/2017   plates and pins   TOTAL SHOULDER ARTHROPLASTY Right 09/27/2015   Social History:  reports that she has never smoked. She has never used smokeless tobacco. She reports that she does not drink alcohol and does not use drugs.  Allergies  Allergen Reactions   Latex Rash    Contact rash    Family History  Problem Relation  Age of Onset   Breast cancer Mother 26   Lymphoma Mother    Diabetes Father    Kidney cancer Father    Heart disease Father    Diabetes Sister    Breast cancer Sister 37    Prior to Admission medications   Medication Sig Start Date End Date Taking? Authorizing Provider  acetaminophen (TYLENOL) 650 MG CR tablet Take 1,300 mg by mouth daily as needed for pain.   Yes [provider]  atorvastatin (LIPITOR) 10 MG tablet Take 10 mg by mouth daily. 01/28/21  Yes [provider]  Cholecalciferol 50 MCG (2000 UT) CAPS Take by mouth.   Yes [provider]  ciprofloxacin (CIPRO) 500 MG tablet Take 500 mg by mouth daily. 12/19/21  Yes [provider]  diphenhydrAMINE (BENADRYL) 25 MG tablet Take 25 mg by mouth 2 (two) times daily as needed for itching.   Yes [provider]  gabapentin (NEURONTIN) 400 MG capsule Take 800 mg by mouth daily at 2 am. 01/07/18  Yes [provider]  guaiFENesin-dextromethorphan (ROBITUSSIN DM) 100-10 MG/5ML syrup Take 5 mLs by mouth every 4 (four) hours as needed for cough. 09/13/20  Yes Aline August, MD  hydrocortisone (ANUSOL-HC) 25 MG suppository Place rectally. 10/11/20  Yes [provider]  melatonin 5 MG TABS Take 10 mg by mouth at bedtime as needed (sleep).   Yes [provider]  Multiple Vitamins-Minerals (MULTIVITAMIN WITH MINERALS) tablet Take 1 tablet by mouth daily.   Yes [provider]  ondansetron (ZOFRAN-ODT) 4 MG disintegrating tablet Take by mouth. 12/11/20  Yes [provider]  oxymetazoline (AFRIN) 0.05 % nasal spray Place 1 spray into both nostrils 2 (two) times daily as needed.   Yes [provider]  sertraline (ZOLOFT) 50 MG tablet Take 50 mg by mouth daily. 12/12/17  Yes [provider]  vitamin B-12 (CYANOCOBALAMIN) 1000 MCG tablet Take 1,000 mcg by mouth daily.   Yes [provider]  azelastine (ASTELIN) 0.1 % nasal spray Place into the  nose. Patient not taking: Reported on 01/27/2022 09/03/21 09/03/22  [provider]    Physical Exam: Vitals:   01/27/22 1822 01/27/22 1942 01/27/22 1958 01/27/22 2030  BP: (!) 115/45 (!) 107/49 (!) 101/48 (!) 103/52  Pulse: 92 80 79 82  Resp: 18 18 18    Temp:  98.3 F (36.8 C) 98.3 F (36.8 C)   TempSrc:  Oral Oral   SpO2: 98% 98% 98% 100%  Weight:      Height:       1.  General: Patient lying supine in bed,  no acute distress   2. Psychiatric: Alert and oriented x 3, mood and behavior normal for situation, pleasant and cooperative with exam   3. Neurologic: Speech and language are normal, face is symmetric, moves all 4 extremities voluntarily, at baseline without acute deficits on limited exam   4. HEENMT:  Head is atraumatic,  normocephalic, pupils reactive to light, neck is supple, trachea is midline, mucous membranes are moist   5. Respiratory : Very mild wheeze bilaterally, no rales, no rhonchi, no cyanosis, no increase in work of breathing or accessory muscle use   6. Cardiovascular : Heart rate normal, rhythm is regular, murmur present, rubs or gallops, peripheral edema present, peripheral pulses palpated   7. Gastrointestinal:  Abdomen is soft, with ascites, nontender to palpation bowel sounds active, no masses or organomegaly palpated   8. Skin:  Skin is warm, dry and intact without rashes, acute lesions, or ulcers on limited exam   9.Musculoskeletal:  No acute deformities or trauma, no asymmetry in tone, peripheral edema present, peripheral pulses palpated, no tenderness to palpation in the extremities  Data Reviewed: In the ED Temp 98.3, heart rate 86-92, respiratory rate 16-18, blood pressure 115/45, satting at 98% on room air Borderline leukopenia at 4.1, anemia at 5.6, borderline thrombocytopenia 167 Guaiac negative  It is noted that patient had upper and lower endoscopy 3 to 4 months ago-reports she was told a couple polyps were removed and no  other concerns Patient does have iron deficiency anemia, her last iron infusion was 2 weeks ago Last paracentesis was Tuesday 7.1 L off  Assessment and Plan: * AKI (acute kidney injury) ([place]) - Creatinine baseline 0.9 to -Creatinine today 1.73 -Secondary to anemia -Transfuse and trend in the a.m.  Insomnia - Continue melatonin and Benadryl as needed  Decompensated hepatic cirrhosis (HCC) - Paracentesis with 6-8 L off every week -Last paracentesis June 6, Scheduled for tomorrow -Has been evaluated by [facility] for liver transplant -This is nonalcoholic fatty liver disease -Contributing to baseline anemia -Continue to monitor  Depression - Continue Zoloft  HLD (hyperlipidemia) - Continue statin  Symptomatic anemia - With fatigue and dyspnea -Hemoglobin baseline 7.5, hemoglobin today 5.6 -Bleeding hemorrhoids for 4 days -Guaiac negative -Established patient of Dr. Delton Coombes -consult placed -2 units transfusion -Trend CBC   Diabetes mellitus, controlled ([place]) - Monitor CBGs -Carb modified diet -Sliding scale coverage      Advance Care Planning:   Code Status: Prior full  Consults: [place]rag[place]da  Family Communication: Husband at bedside  Severity of Illness: The appropriate patient status for this patient is OBSERVATION. Observation status is judged to be reasonable and necessary in order to provide the required intensity of service to ensure the patient's safety. The patient's presenting symptoms, physical exam findings, and initial radiographic and laboratory data in the context of their medical condition is felt to place them at decreased risk for further clinical deterioration. Furthermore, it is anticipated that the patient will be medically stable for discharge from the hospital within 2 midnights of admission.   Author: Rolla Plate, DO 01/27/2022 8:59 PM  For on call review [email or web address].

## 2022-01-27 NOTE — Assessment & Plan Note (Signed)
-   Monitor CBGs -Carb modified diet -Sliding scale coverage

## 2022-01-27 NOTE — Assessment & Plan Note (Addendum)
-   Paracentesis with 6-8 L off every week -Last paracentesis June 6, Scheduled for 6/13: ordered for inpatient paracentesis 6/13 with fluid testing -Has been evaluated by [facility] for liver transplant -This is nonalcoholic fatty liver disease -Contributing to baseline anemia -Continue spironolactone -Continue to monitor

## 2022-01-27 NOTE — Assessment & Plan Note (Signed)
-   Continue melatonin and Benadryl as needed

## 2022-01-27 NOTE — Assessment & Plan Note (Signed)
Continue Zoloft 

## 2022-01-27 NOTE — Assessment & Plan Note (Signed)
Continue statin. 

## 2022-01-27 NOTE — Telephone Encounter (Addendum)
Patient called with critical hgb of 5.4.  In light of her rectal bleeding and lab value, she was advised to go to the ER for treatment and evaluation of bleeding.  Verbalized understanding and states she will report to ER.  ER Triage notified of her pending arrival.

## 2022-01-27 NOTE — Assessment & Plan Note (Addendum)
-   With fatigue and dyspnea -Hemoglobin baseline 7.5, hemoglobin today 5.6 -Bleeding hemorrhoids for 4 days -Guaiac negative -Established patient of Dr. Delton Coombes where she can followup after DC -2 units transfusion -additional 1 unit ordered in AM 6/13

## 2022-01-28 ENCOUNTER — Ambulatory Visit: Admission: RE | Admit: 2022-01-28 | Payer: 59 | Source: Ambulatory Visit

## 2022-01-28 ENCOUNTER — Observation Stay (HOSPITAL_COMMUNITY): Payer: 59

## 2022-01-28 ENCOUNTER — Encounter (HOSPITAL_COMMUNITY): Payer: Self-pay | Admitting: Family Medicine

## 2022-01-28 ENCOUNTER — Encounter (HOSPITAL_COMMUNITY): Payer: Self-pay | Admitting: Hematology

## 2022-01-28 DIAGNOSIS — D649 Anemia, unspecified: Secondary | ICD-10-CM | POA: Diagnosis not present

## 2022-01-28 DIAGNOSIS — D61818 Other pancytopenia: Secondary | ICD-10-CM | POA: Diagnosis not present

## 2022-01-28 DIAGNOSIS — K7460 Unspecified cirrhosis of liver: Secondary | ICD-10-CM | POA: Diagnosis not present

## 2022-01-28 DIAGNOSIS — K642 Third degree hemorrhoids: Secondary | ICD-10-CM | POA: Diagnosis not present

## 2022-01-28 DIAGNOSIS — D509 Iron deficiency anemia, unspecified: Secondary | ICD-10-CM | POA: Diagnosis not present

## 2022-01-28 DIAGNOSIS — K7581 Nonalcoholic steatohepatitis (NASH): Secondary | ICD-10-CM | POA: Diagnosis not present

## 2022-01-28 DIAGNOSIS — G4733 Obstructive sleep apnea (adult) (pediatric): Secondary | ICD-10-CM | POA: Diagnosis not present

## 2022-01-28 DIAGNOSIS — K7290 Hepatic failure, unspecified without coma: Secondary | ICD-10-CM | POA: Diagnosis not present

## 2022-01-28 DIAGNOSIS — R188 Other ascites: Secondary | ICD-10-CM | POA: Diagnosis not present

## 2022-01-28 DIAGNOSIS — K766 Portal hypertension: Secondary | ICD-10-CM | POA: Diagnosis not present

## 2022-01-28 DIAGNOSIS — K922 Gastrointestinal hemorrhage, unspecified: Secondary | ICD-10-CM | POA: Diagnosis present

## 2022-01-28 DIAGNOSIS — Z6838 Body mass index (BMI) 38.0-38.9, adult: Secondary | ICD-10-CM | POA: Diagnosis not present

## 2022-01-28 DIAGNOSIS — E1122 Type 2 diabetes mellitus with diabetic chronic kidney disease: Secondary | ICD-10-CM | POA: Diagnosis not present

## 2022-01-28 DIAGNOSIS — D563 Thalassemia minor: Secondary | ICD-10-CM | POA: Diagnosis not present

## 2022-01-28 DIAGNOSIS — K644 Residual hemorrhoidal skin tags: Secondary | ICD-10-CM | POA: Diagnosis not present

## 2022-01-28 DIAGNOSIS — Z8616 Personal history of COVID-19: Secondary | ICD-10-CM | POA: Diagnosis not present

## 2022-01-28 DIAGNOSIS — Z833 Family history of diabetes mellitus: Secondary | ICD-10-CM | POA: Diagnosis not present

## 2022-01-28 DIAGNOSIS — E1140 Type 2 diabetes mellitus with diabetic neuropathy, unspecified: Secondary | ICD-10-CM | POA: Diagnosis not present

## 2022-01-28 DIAGNOSIS — Z8542 Personal history of malignant neoplasm of other parts of uterus: Secondary | ICD-10-CM | POA: Diagnosis not present

## 2022-01-28 DIAGNOSIS — N1832 Chronic kidney disease, stage 3b: Secondary | ICD-10-CM | POA: Diagnosis not present

## 2022-01-28 DIAGNOSIS — F32A Depression, unspecified: Secondary | ICD-10-CM | POA: Diagnosis not present

## 2022-01-28 DIAGNOSIS — E669 Obesity, unspecified: Secondary | ICD-10-CM | POA: Diagnosis not present

## 2022-01-28 DIAGNOSIS — E785 Hyperlipidemia, unspecified: Secondary | ICD-10-CM | POA: Diagnosis not present

## 2022-01-28 DIAGNOSIS — D62 Acute posthemorrhagic anemia: Secondary | ICD-10-CM | POA: Diagnosis not present

## 2022-01-28 DIAGNOSIS — N179 Acute kidney failure, unspecified: Secondary | ICD-10-CM | POA: Diagnosis not present

## 2022-01-28 DIAGNOSIS — Z96611 Presence of right artificial shoulder joint: Secondary | ICD-10-CM | POA: Diagnosis not present

## 2022-01-28 DIAGNOSIS — K7469 Other cirrhosis of liver: Secondary | ICD-10-CM | POA: Diagnosis not present

## 2022-01-28 DIAGNOSIS — D6959 Other secondary thrombocytopenia: Secondary | ICD-10-CM | POA: Diagnosis not present

## 2022-01-28 LAB — BODY FLUID CELL COUNT WITH DIFFERENTIAL
Eos, Fluid: 0 %
Lymphs, Fluid: 68 %
Monocyte-Macrophage-Serous Fluid: 27 % — ABNORMAL LOW (ref 50–90)
Neutrophil Count, Fluid: 5 % (ref 0–25)
Total Nucleated Cell Count, Fluid: 80 cu mm (ref 0–1000)

## 2022-01-28 LAB — GRAM STAIN

## 2022-01-28 LAB — CBC WITH DIFFERENTIAL/PLATELET
Abs Immature Granulocytes: 0.02 10*3/uL (ref 0.00–0.07)
Basophils Absolute: 0 10*3/uL (ref 0.0–0.1)
Basophils Relative: 1 %
Eosinophils Absolute: 0.3 10*3/uL (ref 0.0–0.5)
Eosinophils Relative: 7 %
HCT: 21.2 % — ABNORMAL LOW (ref 36.0–46.0)
Hemoglobin: 6.6 g/dL — CL (ref 12.0–15.0)
Immature Granulocytes: 1 %
Lymphocytes Relative: 25 %
Lymphs Abs: 0.9 10*3/uL (ref 0.7–4.0)
MCH: 21 pg — ABNORMAL LOW (ref 26.0–34.0)
MCHC: 31.1 g/dL (ref 30.0–36.0)
MCV: 67.3 fL — ABNORMAL LOW (ref 80.0–100.0)
Monocytes Absolute: 0.5 10*3/uL (ref 0.1–1.0)
Monocytes Relative: 13 %
Neutro Abs: 2 10*3/uL (ref 1.7–7.7)
Neutrophils Relative %: 53 %
Platelets: 135 10*3/uL — ABNORMAL LOW (ref 150–400)
RBC: 3.15 MIL/uL — ABNORMAL LOW (ref 3.87–5.11)
RDW: 25.1 % — ABNORMAL HIGH (ref 11.5–15.5)
WBC: 3.6 10*3/uL — ABNORMAL LOW (ref 4.0–10.5)
nRBC: 0.6 % — ABNORMAL HIGH (ref 0.0–0.2)

## 2022-01-28 LAB — COMPREHENSIVE METABOLIC PANEL
ALT: 18 U/L (ref 0–44)
AST: 33 U/L (ref 15–41)
Albumin: 2.5 g/dL — ABNORMAL LOW (ref 3.5–5.0)
Alkaline Phosphatase: 88 U/L (ref 38–126)
Anion gap: 5 (ref 5–15)
BUN: 36 mg/dL — ABNORMAL HIGH (ref 8–23)
CO2: 19 mmol/L — ABNORMAL LOW (ref 22–32)
Calcium: 8.3 mg/dL — ABNORMAL LOW (ref 8.9–10.3)
Chloride: 112 mmol/L — ABNORMAL HIGH (ref 98–111)
Creatinine, Ser: 1.67 mg/dL — ABNORMAL HIGH (ref 0.44–1.00)
GFR, Estimated: 34 mL/min — ABNORMAL LOW (ref >60)
Glucose, Bld: 146 mg/dL — ABNORMAL HIGH (ref 70–99)
Potassium: 4.6 mmol/L (ref 3.5–5.1)
Sodium: 136 mmol/L (ref 135–145)
Total Bilirubin: 2.1 mg/dL — ABNORMAL HIGH (ref 0.3–1.2)
Total Protein: 4.6 g/dL — ABNORMAL LOW (ref 6.5–8.1)

## 2022-01-28 LAB — HEMOGLOBIN A1C
Hgb A1c MFr Bld: 5.7 % — ABNORMAL HIGH (ref 4.8–5.6)
Mean Plasma Glucose: 116.89 mg/dL

## 2022-01-28 LAB — GLUCOSE, CAPILLARY
Glucose-Capillary: 120 mg/dL — ABNORMAL HIGH (ref 70–99)
Glucose-Capillary: 226 mg/dL — ABNORMAL HIGH (ref 70–99)

## 2022-01-28 LAB — PREPARE RBC (CROSSMATCH)

## 2022-01-28 LAB — MAGNESIUM: Magnesium: 2.2 mg/dL (ref 1.7–2.4)

## 2022-01-28 LAB — PROTIME-INR
INR: 1.2 (ref 0.8–1.2)
Prothrombin Time: 14.8 seconds (ref 11.4–15.2)

## 2022-01-28 LAB — HEMOGLOBIN AND HEMATOCRIT, BLOOD
HCT: 24.8 % — ABNORMAL LOW (ref 36.0–46.0)
Hemoglobin: 7.7 g/dL — ABNORMAL LOW (ref 12.0–15.0)

## 2022-01-28 MED ORDER — POLYETHYLENE GLYCOL 3350 17 G PO PACK
17.0000 g | PACK | Freq: Every day | ORAL | Status: DC
  Administered 2022-01-28 – 2022-01-30 (×3): 17 g via ORAL
  Filled 2022-01-28 (×3): qty 1

## 2022-01-28 MED ORDER — GABAPENTIN 400 MG PO CAPS
800.0000 mg | ORAL_CAPSULE | Freq: Two times a day (BID) | ORAL | Status: DC
  Administered 2022-01-28 – 2022-01-30 (×5): 800 mg via ORAL
  Filled 2022-01-28 (×5): qty 2

## 2022-01-28 MED ORDER — HYDROCORTISONE ACETATE 25 MG RE SUPP
25.0000 mg | Freq: Two times a day (BID) | RECTAL | Status: DC
  Administered 2022-01-28 – 2022-01-30 (×5): 25 mg via RECTAL
  Filled 2022-01-28 (×5): qty 1

## 2022-01-28 MED ORDER — SODIUM CHLORIDE 0.9% IV SOLUTION: Freq: Once | INTRAVENOUS | Status: AC

## 2022-01-28 NOTE — Progress Notes (Signed)
Date and time results received: 01/28/22 1661.   Test: Hgb Critical Value: 6.6  Name of Provider Notified: MD Zierle-Ghosh  Orders Received? Or Actions Taken?: No new orders at this time.

## 2022-01-28 NOTE — Consult Note (Signed)
Gastroenterology Consult   Referring Provider: Dr. Wynetta Emery Primary Care Physician:  [name], MD Primary Gastroenterologist:  Dr. Andrey Farmer, [facility]  Patient ID: BRAD MCGAUGHY; [id number]; 08-Mar-1956   Admit date: 01/27/2022  LOS: 0 days   Date of Consultation: 01/28/2022  Reason for Consultation:  Rectal bleeding  History of Present Illness   KELICIA YOUTZ is a 66 y.o. 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 There is report of possible SBP last year, but this was treated as outpatient. She is established with [facility] and has also seen Nephrology in consideration of renal transplant at time of liver transplantation.   Hgb historically has been in the mid 7 range; she presented with Hgb 5.4. Received 2 units PRBCs. This morning Hgb was 6.6. An additional unit PRBCs has been ordered. Heme negative. Notes chronic intermittent rectal bleeding in setting of hemorrhoids. She had heavier bleeding over the weekend. Felt she was getting weaker and requested to have blood work done by Hematology, who follows her for IDA and thalassemia. Her last colonoscopy was in April 2022 with inadequate colon prep, one 2 mm cecal polyp, and internal hemorrhoids. Last EGD Feb 2022 without varices, negative H.pylori, +reactive gastropathy.   She denies any melena. Notes at least a year of chronic intermittent rectal bleeding, waxing and waning in quantity. She has prolonged toilet time. Rectal itching and burning at times. No abdominal pain. No mental status changes. No jaundice or pruritis. Notes chronic lower extremity edema extending to mid thigh. She has serial  paras set up and tells me that she has up to 7 liters removed. Prior to TIPS, she was averaging 11-13 liters removed. She is requesting a para today, as this is her weekly scheduled time. She feels less fatigued s/p transfusion.   Last blood transfusion Feb 2022.Marland Kitchen Upcoming visit with [facility] 02/05/22. She does not want to pursue endoscopic evaluations while inpatient. She would prefer to follow-up with primary GI as outpatient. She is on daily Cipro for SBP prophylaxis.    CT March 2023 without contrast: cirrhosis with large volume ascites, splenomegaly  Prior endoscopic evaluations: April 2022 Colonoscopy: inadequate colon prep, one 2 mm cecal polyp (tubular adenoma), and internal hemorrhoids. Feb 2022 EGD: no varices, negative H.pylori, +reactive gastropathy.  Past Medical History:  Diagnosis Date   Anxiety    Arthritis    Cirrhosis of liver ([place])    Diabetes mellitus without complication (HCC)    Dyspnea    DOE   GERD (gastroesophageal reflux disease)    Hepatitis    PAST   Hypertension    Neuropathy    Neuropathy, diabetic ([place])    Pneumonia    Sinus complaint    Sleep apnea    CPAP   Thalassemia minor 1992    Past Surgical History:  Procedure Laterality Date   ABDOMINAL HYSTERECTOMY     BREAST BIOPSY Right 02/15/2018   [place] bx 6-6:30 ribbon shape, ONE CORE FRAGMENT WITH FIBROSIS. ONE CORE FRAGMENT WITH PORTION OF A DILATED   BREAST BIOPSY Right 02/15/2018   [place] bx 9:00 heart shape, USUAL DUCTAL HYPERPLASIA   BREAST LUMPECTOMY Right 03/09/2018   Procedure: BREAST LUMPECTOMY  x 2;  Surgeon: Benjamine Sprague, DO;  Location: [facility] ORS;  Service: General;  Laterality: Right;   CATARACT EXTRACTION W/PHACO Left 06/11/2016   Procedure: CATARACT EXTRACTION PHACO AND INTRAOCULAR LENS PLACEMENT (IOC);  Surgeon: Estill Cotta, MD;  Location: [facility] OR[facility];  Service: Ophthalmology;  Laterality: Left;  Lot # [id number] H US:01:38.6 AP%:26.4 CDE:44.15   CATARACT EXTRACTION W/PHACO Right 06/15/2018    Procedure: CATARACT EXTRACTION PHACO AND INTRAOCULAR LENS PLACEMENT (IOC);  Surgeon: Birder Robson, MD;  Location: [facility];  Service: Ophthalmology;  Laterality: Right;  [place] 00:38.2 CDE 4.23 Fluid Pack Lot # [id number]   COLONOSCOPIES     COLONOSCOPY WITH PROPOFOL N/A 10/10/2020   Procedure: COLONOSCOPY WITH PROPOFOL;  Surgeon: Lesly Rubenstein, MD;  Location: [facility] ENDOSCOPY;  Service: Endoscopy;  Laterality: N/A;   COLONOSCOPY WITH PROPOFOL N/A 11/20/2020   Procedure: COLONOSCOPY WITH PROPOFOL;  Surgeon: Lesly Rubenstein, MD;  Location: [facility] ENDOSCOPY;  Service: Endoscopy;  Laterality: N/A;  DM STAT CBC, BMP COVID POSITIVE 09/02/2020   CTR     ESOPHAGOGASTRODUODENOSCOPY (EGD) WITH PROPOFOL N/A 10/10/2020   Procedure: ESOPHAGOGASTRODUODENOSCOPY (EGD) WITH PROPOFOL;  Surgeon: Lesly Rubenstein, MD;  Location: [facility] ENDOSCOPY;  Service: Endoscopy;  Laterality: N/A;  COVID POSITIVE 10/08/2020   IR PARACENTESIS  12/17/2021   JOINT REPLACEMENT     KNEE SURGERY Right 09/24/2017   plates and pins   TOTAL SHOULDER ARTHROPLASTY Right 09/27/2015    Prior to Admission medications   Medication Sig Start Date End Date Taking? Authorizing Provider  acetaminophen (TYLENOL) 650 MG CR tablet Take 1,300 mg by mouth daily as needed for pain.   Yes [provider]  atorvastatin (LIPITOR) 10 MG tablet Take 10 mg by mouth daily. 01/28/21  Yes [provider]  Cholecalciferol 50 MCG (2000 UT) CAPS Take by mouth.   Yes [provider]  ciprofloxacin (CIPRO) 500 MG tablet Take 500 mg by mouth daily. 12/19/21  Yes [provider]  diphenhydrAMINE (BENADRYL) 25 MG tablet Take 25 mg by mouth 2 (two) times daily as needed for itching.   Yes [provider]  gabapentin (NEURONTIN) 400 MG capsule Take 800 mg by mouth daily at 2 am. 01/07/18  Yes [provider]  guaiFENesin-dextromethorphan (ROBITUSSIN DM) 100-10 MG/5ML syrup Take 5 mLs by mouth every 4 (four) hours  as needed for cough. 09/13/20  Yes Aline August, MD  hydrocortisone (ANUSOL-HC) 25 MG suppository Place rectally. 10/11/20  Yes [provider]  melatonin 5 MG TABS Take 10 mg by mouth at bedtime as needed (sleep).   Yes [provider]  Multiple Vitamins-Minerals (MULTIVITAMIN WITH MINERALS) tablet Take 1 tablet by mouth daily.   Yes [provider]  ondansetron (ZOFRAN-ODT) 4 MG disintegrating tablet Take by mouth. 12/11/20  Yes [provider]  oxymetazoline (AFRIN) 0.05 % nasal spray Place 1 spray into both nostrils 2 (two) times daily as needed.   Yes [provider]  sertraline (ZOLOFT) 50 MG tablet Take 50 mg by mouth daily. 12/12/17  Yes [provider]  vitamin B-12 (CYANOCOBALAMIN) 1000 MCG tablet Take 1,000 mcg by mouth daily.   Yes [provider]  azelastine (ASTELIN) 0.1 % nasal spray Place into the nose. Patient not taking: Reported on 01/27/2022 09/03/21 09/03/22  [provider]    Current Facility-Administered Medications  Medication Dose Route Frequency Provider Last Rate Last Admin   0.9 %  sodium chloride infusion (Manually program via Guardrails IV Fluids)   Intravenous Once Johnson, Clanford L,  MD       0.9 %  sodium chloride infusion  10 mL/hr Intravenous Once Zierle-Ghosh, Asia B, DO       acetaminophen (TYLENOL) tablet 650 mg  650 mg Oral Q6H PRN Zierle-Ghosh, Asia B, DO   650 mg at 01/28/22 0021   Or   acetaminophen (TYLENOL) suppository 650 mg  650 mg Rectal Q6H PRN Zierle-Ghosh, Asia B, DO       atorvastatin (LIPITOR) tablet 10 mg  10 mg Oral Daily Zierle-Ghosh, Asia B, DO   10 mg at 01/28/22 7989   diphenhydrAMINE (BENADRYL) capsule 25 mg  25 mg Oral QHS PRN Zierle-Ghosh, Asia B, DO       gabapentin (NEURONTIN) capsule 800 mg  800 mg Oral BID Johnson, Clanford L, MD       hydrocortisone (ANUSOL-HC) suppository 25 mg  25 mg Rectal BID PRN Zierle-Ghosh, Asia B, DO       insulin aspart (novoLOG)  injection 0-15 Units  0-15 Units Subcutaneous TID WC Zierle-Ghosh, Asia B, DO   2 Units at 01/28/22 2119   insulin aspart (novoLOG) injection 0-5 Units  0-5 Units Subcutaneous QHS Zierle-Ghosh, Asia B, DO       melatonin tablet 6 mg  6 mg Oral QHS PRN Zierle-Ghosh, Asia B, DO       morphine (PF) 2 MG/ML injection 2 mg  2 mg Intravenous Q2H PRN Zierle-Ghosh, Asia B, DO       ondansetron (ZOFRAN) tablet 4 mg  4 mg Oral Q6H PRN Zierle-Ghosh, Asia B, DO       Or   ondansetron (ZOFRAN) injection 4 mg  4 mg Intravenous Q6H PRN Zierle-Ghosh, Asia B, DO       oxyCODONE (Oxy IR/ROXICODONE) immediate release tablet 5 mg  5 mg Oral Q4H PRN Zierle-Ghosh, Asia B, DO       sertraline (ZOLOFT) tablet 50 mg  50 mg Oral Daily Zierle-Ghosh, Asia B, DO   50 mg at 01/28/22 0921    Allergies as of 01/27/2022 - Review Complete 01/27/2022  Allergen Reaction Noted   Latex Rash 09/02/2016    Family History  Problem Relation Age of Onset   Breast cancer Mother 53   Lymphoma Mother    Diabetes Father    Kidney cancer Father    Heart disease Father    Diabetes Sister    Breast cancer Sister 49    Social History   Socioeconomic History   Marital status: Married    Spouse name: Not on file   Number of children: 0   Years of education: Not on file   Highest education level: Not on file  Occupational History   Occupation: EMPLOYED  Tobacco Use   Smoking status: Never   Smokeless tobacco: Never  Vaping Use   Vaping Use: Never used  Substance and Sexual Activity   Alcohol use: No   Drug use: Never   Sexual activity: Not Currently  Other Topics Concern   Not on file  Social History Narrative   Not on file   Social Determinants of Health   Financial Resource Strain: Low Risk  (01/31/2021)   Overall Financial Resource Strain (CARDIA)    Difficulty of Paying Living Expenses: Not hard at all  Food Insecurity: No Food Insecurity (01/31/2021)   Hunger Vital Sign    Worried About Running Out of Food in  the Last Year: Never true    Ran Out of Food in the Last Year: Never true  Transportation  Needs: No Transportation Needs (01/31/2021)   PRAPARE - Hydrologist (Medical): No    Lack of Transportation (Non-Medical): No  Physical Activity: Inactive (01/31/2021)   Exercise Vital Sign    Days of Exercise per Week: 0 days    Minutes of Exercise per Session: 0 min  Stress: No Stress Concern Present (01/31/2021)   Trooper    Feeling of Stress : Not at all  Social Connections: Moderately Isolated (01/31/2021)   Social Connection and Isolation Panel [NHANES]    Frequency of Communication with Friends and Family: More than three times a week    Frequency of Social Gatherings with Friends and Family: Once a week    Attends Religious Services: Never    Marine scientist or Organizations: No    Attends Archivist Meetings: Never    Marital Status: Married  Human resources officer Violence: Not At Risk (01/31/2021)   Humiliation, Afraid, Rape, and Kick questionnaire    Fear of Current or Ex-Partner: No    Emotionally Abused: No    Physically Abused: No    Sexually Abused: No     Review of Systems   Gen: Denies any fever, chills, loss of appetite, change in weight or weight loss CV: Denies chest pain, heart palpitations, syncope, edema  Resp: Denies shortness of breath with rest, cough, wheezing, coughing up blood, and pleurisy. GI: see HPI GU : Denies urinary burning, blood in urine, urinary frequency, and urinary incontinence. MS: Denies joint pain, limitation of movement, swelling, cramps, and atrophy.  Derm: Denies rash, itching, dry skin, hives. Psych: Denies depression, anxiety, memory loss, hallucinations, and confusion. Heme: see HPI Neuro:  Denies any headaches, dizziness, paresthesias, shaking  Physical Exam   Vital Signs in last 24 hours: Temp:  [98.1 F (36.7 C)-98.7 F (37.1  C)] 98.1 F (36.7 C) (06/13 0422) Pulse Rate:  [71-92] 71 (06/13 0422) Resp:  [14-18] 17 (06/13 0422) BP: (98-117)/(43-52) 98/44 (06/13 0422) SpO2:  [97 %-100 %] 97 % (06/13 0422) Weight:  [117.9 kg] 117.9 kg (06/12 1640) Last BM Date : 01/27/22  General:   Alert,  Well-developed, well-nourished, pleasant and cooperative in NAD Head:  Normocephalic and atraumatic. Eyes:  Sclera clear, no icterus.    Ears:  Normal auditory acuity. Mouth:  No deformity or lesions, dentition normal. Lungs:  Clear throughout to auscultation.    Heart:  S1 S2 present with systolic murmur Abdomen:  Soft, obese, nontender and nondistended. Moderate ascites, non-tense, anasarca at flanks Rectal: external hemorrhoids, prolapsing grade 3 hemorrhoids easily reduced, prominence of all columns internally, scant blood on DRE   Msk:  Symmetrical without gross deformities. Normal posture. Extremities:  With 2-3+ pitting edema lower extremities, 1+ thigh Neurologic:  Alert and  oriented x4. Chronic left hand tremor. No obvious asterixis Skin:  Intact without significant lesions or rashes. Psych:  Alert and cooperative. Normal mood and affect.  Intake/Output from previous day: 06/12 0701 - 06/13 0700 In: 792.7 [Blood:792.7] Out: -  Intake/Output this shift: No intake/output data recorded.    Labs/Studies   Recent Labs Recent Labs    01/27/22 1555 01/27/22 1739 01/28/22 0507  WBC 4.3 4.1 3.6*  HGB 5.4* 5.6* 6.6*  HCT 18.3* 18.5* 21.2*  PLT 161 167 135*   BMET Recent Labs    01/27/22 1739 01/28/22 0507  NA 136 136  K 4.8 4.6  CL 111 112*  CO2 20* 19*  GLUCOSE 183* 146*  BUN 37* 36*  CREATININE 1.73* 1.67*  CALCIUM 8.4* 8.3*   LFT Recent Labs    01/28/22 0507  PROT 4.6*  ALBUMIN 2.5*  AST 33  ALT 18  ALKPHOS 88  BILITOT 2.1*   PT/INR Recent Labs    01/27/22 1808  LABPROT 14.6  INR 1.2     Radiology/Studies DG CHEST PORT 1 VIEW  Result Date: 01/27/2022 CLINICAL DATA:   Shortness of breath and wheezing. EXAM: PORTABLE CHEST 1 VIEW COMPARISON:  Chest radiograph dated 10/01/2021. FINDINGS: No focal consolidation, pleural effusion, pneumothorax. Top-normal cardiac size. No acute osseous pathology. Right shoulder arthroplasty. IMPRESSION: No active disease. Electronically Signed   By: Anner Crete M.D.   On: 01/27/2022 21:31     Assessment   GORDON CARLSON is a 66 y.o. 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 She is established with [facility] and has also seen Nephrology in consideration of renal transplant at time of liver transplantation. GI consulted due to rectal bleeding.  Rectal bleeding: in setting of known hemorrhoids. This has been ongoing for at least a year. Last colonoscopy April 2022 but inadequate prep. Offered colonoscopy/EGD this admission, but she is declining and would prefer to do this as outpatient. Hemodynamically stable. Not consistent with rapid transit UGI bleed/variceal bleed. DRE with obvious hemorrhoids as noted in physical exam. Last EGD without varices in 2022. We discussed behavior modifications to help avoid hemorrhoid flares.   Acute on chronic anemia: presenting with Hgb 5.4 range, down from her baseline of mid 7 range. Noting larger amount of rectal bleeding over the past weekend. Known history of IDA and thalassemia contributing. 2 units PRBCs received with an additional 1 unit ordered today. Baseline Hgb in mid 7 range. Once transfused, would be appropriate for discharge, as she is declining endoscopic evaluations while inpatient.  Cirrhosis: presumed due to NASH. Child Class C.  Established with [facility] and in evaluation for liver/renal transplant at [facility]. Upcoming appt with [facility] on 02/05/22. TIPS procedure 12/26/21, with reduction in para volume noted but still has standing orders for weekly paras. She is unable to tolerate diuretic therapy due to CRF. Requesting para today, as this is her normal scheduled appt. We will arrange this for her.   Possible history of SBP: per patient last year but treated as outpatient. Recommend resuming Cipro daily as she takes outpatient.   Patient is well-versed on her medical history and an excellent historian. She is well aware of signs/symptoms that would necessitate presentation back to the ED. She desires to pursue any further evaluations as outpatient.       Plan / Recommendations    Transfuse additional 1 unit PRBCs Resume SBP prophylaxis [place] para today per her request (standing orders) albumin per protocol Miralax daily Anusol suppositories. Limit toilet time to 2-3 minutes, avoid straining As patient declining endoscopic intervention, should be appropriate for discharge after transfusion. Upcoming appt with [facility] already in place Check INR to calculate MELD with today's labs GI signing off as patient declining procedures: please let us know if any further assistance Follow-up with established GI as outpatient     01/28/2022, 10:46  AM  Annitta Needs, PhD, ANP-BC [facility]

## 2022-01-28 NOTE — Progress Notes (Signed)
PT tolerated right sided paracentesis procedure well today and 5.2 Liters of clear yellow fluid removed with labs collected and sent for processing. PT verbalized understanding of post procedure instructions and returned to inpatient bed assignment at this time via stretcher with no acute distress noted.

## 2022-01-28 NOTE — Progress Notes (Signed)
PROGRESS NOTE   Rachel Huber  PNT:[id number] DOB: 09/05/1955 DOA: 01/27/2022 PCP: [name], MD   No chief complaint on file.  Level of care: Telemetry  Brief Admission History:  66 y.o. 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  Patient reports that she had bleeding hemorrhoids that started 4 days ago.  He was bleeding heavier 3 and 2 days ago, but later today.  She reports she started noticing that she was to fatigued and weak to walk across the house.  She did have to walk halfway take a rest and then finish the rest of the way.  Last time that she felt the symptoms was because she was too anemic.  She reports that that was about 16 months ago.  She denies chest pain, headache, paresthesias.  She does admit to shortness of breath but its not as bothersome to her as the fatigue and weakness.  Patient has no other complaints at this time.   Patient does not smoke, does not drink alcohol, does not use illicit drugs.  She is vaccinated for COVID.  Patient is full code.   Assessment and Plan: * AKI (acute kidney injury) ([place]) - Creatinine baseline 0.9 to -Creatinine slowly trending down -Secondary to anemia -Transfuse and trend in the a.m.  Acute GI hemorrhage -- appreciate GI consult and recommendations -- transfusing 3 units PRBC total to try to get her close to her baseline of 7.4 -- cbc in AM   Insomnia - Continue melatonin and Benadryl as needed  Decompensated hepatic cirrhosis (HCC) - Paracentesis with 6-8 L off every week -Last paracentesis June 6, Scheduled for 6/13: ordered for inpatient paracentesis 6/13 with fluid testing -Has been evaluated by [facility] for liver transplant -This is nonalcoholic fatty liver disease -Contributing to baseline anemia -Continue  spironolactone -Continue to monitor  Depression - Continue Zoloft  HLD (hyperlipidemia) - Continue statin  Symptomatic anemia - With fatigue and dyspnea -Hemoglobin baseline 7.5, hemoglobin today 5.6 -Bleeding hemorrhoids for 4 days -Guaiac negative -Established patient of Dr. Delton Coombes where she can followup after DC -2 units transfusion -additional 1 unit ordered in AM 6/13   Diabetes mellitus, controlled ([place]) - Monitor CBGs -Carb modified diet -Sliding scale coverage  DVT prophylaxis: SCD Code Status: full  Family Communication:  Disposition: Status is: Inpatient Remains inpatient appropriate because: transfusing PRBC, unstable hemodynamics    Consultants:  GI Procedures:  Paracentesis 6/13 Antimicrobials:    Subjective: Pt reports that she has had no further bleeding since last evening.   Objective: Vitals:   01/28/22 1336 01/28/22 1407 01/28/22 1417 01/28/22 1515  BP: (!) 104/50 (!) 102/39 (!) 108/47 (!) 105/46  Pulse: 74 73 75 77  Resp: 18 18 18 18   Temp:    97.9 F (36.6 C)  TempSrc:    Oral  SpO2: 97% 100% 100% 100%  Weight:      Height:        Intake/Output Summary (Last 24 hours) at 01/28/2022 1757 Last data filed at 01/28/2022 1515 Gross per 24 hour  Intake 2268.67 ml  Output --  Net 2268.67 ml   Filed Weights   01/27/22 1640  Weight: 117.9 kg   Examination:  General exam: Appears calm and comfortable  Respiratory system: Clear to auscultation. Respiratory effort normal. Cardiovascular system: normal S1 & S2 heard. No JVD, murmurs, rubs, gallops  or clicks. No pedal edema. Gastrointestinal system: Abdomen is markedly distended with fluid, soft and nontender. No organomegaly or masses felt. Normal bowel sounds heard. Central nervous system: Alert and oriented. No focal neurological deficits. Extremities: Symmetric 5 x 5 power. Skin: No rashes, lesions or ulcers. Psychiatry: Judgement and insight appear normal. Mood & affect appropriate.    Data Reviewed: I have personally reviewed following labs and imaging studies  CBC: Recent Labs  Lab 01/27/22 1555 01/27/22 1739 01/28/22 0507  WBC 4.3 4.1 3.6*  NEUTROABS 2.7 2.7 2.0  HGB 5.4* 5.6* 6.6*  HCT 18.3* 18.5* 21.2*  MCV 62.7* 62.9* 67.3*  PLT 161 167 135*    Basic Metabolic Panel: Recent Labs  Lab 01/27/22 1739 01/28/22 0507  NA 136 136  K 4.8 4.6  CL 111 112*  CO2 20* 19*  GLUCOSE 183* 146*  BUN 37* 36*  CREATININE 1.73* 1.67*  CALCIUM 8.4* 8.3*  MG  --  2.2    CBG: Recent Labs  Lab 01/28/22 0026  GLUCAP 120*    Recent Results (from the past 240 hour(s))  Culture, body fluid w Gram Stain-bottle     Status: None (Preliminary result)   Collection Time: 01/28/22  1:00 PM   Specimen: Peritoneal Washings  Result Value Ref Range Status   Specimen Description PERITONEAL  Final   Special Requests   Final    BOTTLES DRAWN AEROBIC AND ANAEROBIC Blood Culture adequate volume Performed at [facility], [address], [place], [place] [address]    Culture PENDING  Incomplete   Report Status PENDING  Incomplete  Gram stain     Status: None   Collection Time: 01/28/22  1:00 PM   Specimen: Peritoneal Washings  Result Value Ref Range Status   Specimen Description PERITONEAL  Final   Special Requests NONE  Final   Gram Stain   Final    CYTOSPIN SMEAR WBC PRESENT,BOTH PMN AND MONONUCLEAR NO ORGANISMS SEEN Performed at [facility], [address] [place], [place] [address]    Report Status 01/28/2022 FINAL  Final     Radiology Studies: US Paracentesis  Result Date: 01/28/2022 Sherryl Barters, MD     01/28/2022  2:00 PM INDICATION: Ascites EXAM: ULTRASOUND GUIDED RIGHT PARACENTESIS GRIP-IR: Category: Fluids Subcategory: Paracentesis Follow-Up: None MEDICATIONS: None. COMPLICATIONS: None immediate. PROCEDURE: Informed written consent was obtained from the patient after a discussion of the risks (including hemorrhage and infection, among others), benefits  and alternatives to treatment. A timeout was performed prior to the initiation of the procedure. Initial ultrasound scanning demonstrates a large amount of ascites within the right lower abdominal quadrant. The right lower abdomen was prepped and draped in the usual sterile fashion. 1% lidocaine  was used for local anesthesia. Following this, a Yueh catheter was introduced. An ultrasound image was saved for documentation purposes. The paracentesis was performed. The catheter was removed and a dressing was applied. The patient tolerated the procedure well without immediate post procedural complication. Postprocedural IV albumin is not necessarily required but might be considered given the patient's relatively low resting blood pressure. FINDINGS: A total of approximately 5.2 L of straw-colored fluid was removed. Samples were sent to the laboratory as requested by the clinical team. IMPRESSION: Successful ultrasound-guided paracentesis yielding 5.2 liters of peritoneal fluid.   DG CHEST PORT 1 VIEW  Result Date: 01/27/2022 CLINICAL DATA:  Shortness of breath and wheezing. EXAM: PORTABLE CHEST 1 VIEW COMPARISON:  Chest radiograph dated 10/01/2021. FINDINGS: No focal consolidation, pleural effusion, pneumothorax.  Top-normal cardiac size. No acute osseous pathology. Right shoulder arthroplasty. IMPRESSION: No active disease. Electronically Signed   By: Anner Crete M.D.   On: 01/27/2022 21:31    Scheduled Meds:  atorvastatin  10 mg Oral Daily   gabapentin  800 mg Oral BID   hydrocortisone  25 mg Rectal BID   insulin aspart  0-15 Units Subcutaneous TID WC   insulin aspart  0-5 Units Subcutaneous QHS   polyethylene glycol  17 g Oral Daily   sertraline  50 mg Oral Daily   Continuous Infusions:  sodium chloride       LOS: 0 days   Time spent: 36 mins  Kirin Pastorino Wynetta Emery, MD How to contact the [facility] Attending or Consulting provider [place] or covering provider during after hours [place], for this  patient?  Check the care team in [facility] and look for a) attending/consulting TRH provider listed and b) the [facility] team listed Log into [email or web address] and use [facility]'s universal password to access. If you do not have the password, please contact the hospital operator. Locate the [facility] provider you are looking for under [facility] and page to a number that you can be directly reached. If you still have difficulty reaching the provider, please page the [facility] (Director on Call) for the Hospitalists listed on amion for assistance.  01/28/2022, 5:57 PM

## 2022-01-28 NOTE — TOC Progression Note (Signed)
Transition of Care [facility]) - Progression Note    Patient Details  Name: DORANN DAVIDSON MRN: [id number] Date of Birth: 06-30-1956  Transition of Care [facility]) CM/SW Contact  Salome Arnt, [place] Phone Number: 01/28/2022, 9:57 AM  Clinical Narrative:   Transition of Care (TOC) Screening Note   Patient Details  Name: [name] Date of Birth: 12-27-1955   Transition of Care [facility]) CM/SW Contact:    Salome Arnt, [place] Phone Number: 01/28/2022, 9:57 AM    Transition of Care Department [facility]) has reviewed patient and no TOC needs have been identified at this time. We will continue to monitor patient advancement through interdisciplinary progression rounds. If new patient transition needs arise, please place a TOC consult.          Barriers to Discharge: Continued Medical Work up  Expected Discharge Plan and Services                                                 Social Determinants of Health (SDOH) Interventions    Readmission Risk Interventions     No data to display

## 2022-01-28 NOTE — Plan of Care (Signed)

## 2022-01-28 NOTE — Assessment & Plan Note (Signed)
--   appreciate GI consult and recommendations -- transfusing 3 units PRBC total to try to get her close to her baseline of 7.4 -- cbc in AM

## 2022-01-28 NOTE — Hospital Course (Signed)
66 y.o. 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  Patient reports that she had bleeding hemorrhoids that started 4 days ago.  He was bleeding heavier 3 and 2 days ago, but later today.  She reports she started noticing that she was to fatigued and weak to walk across the house.  She did have to walk halfway take a rest and then finish the rest of the way.  Last time that she felt the symptoms was because she was too anemic.  She reports that that was about 16 months ago.  She denies chest pain, headache, paresthesias.  She does admit to shortness of breath but its not as bothersome to her as the fatigue and weakness.  Patient has no other complaints at this time.   Patient does not smoke, does not drink alcohol, does not use illicit drugs.  She is vaccinated for COVID.  Patient is full code.

## 2022-01-28 NOTE — Progress Notes (Signed)
Pt has been up to bathroom this am with only standby assist, had brown BM, no bleeding noted. Ambulated without difficulty, denies any SOB or lightheadedness.  Pt has eaten breakfast, tolerated well. Currently, MD Wynetta Emery in room to evaluate pt and discuss plan of care.

## 2022-01-28 NOTE — Procedures (Signed)
INDICATION: Ascites EXAM: ULTRASOUND GUIDED RIGHT PARACENTESIS GRIP-IR: Category: Fluids  Subcategory: Paracentesis  Follow-Up: None  MEDICATIONS: None. COMPLICATIONS: None immediate. PROCEDURE: Informed written consent was obtained from the patient after a discussion of the risks (including hemorrhage and infection, among others), benefits and alternatives to treatment. A timeout was performed prior to the initiation of the procedure.  Initial ultrasound scanning demonstrates a large amount of ascites within the right lower abdominal quadrant. The right lower abdomen was prepped and draped in the usual sterile fashion. 1% lidocaine  was used for local anesthesia.   Following this, a Yueh catheter was introduced. An ultrasound image was saved for documentation purposes. The paracentesis was performed. The catheter was removed and a dressing was applied. The patient tolerated the procedure well without immediate post procedural complication.   Postprocedural IV albumin is not necessarily required but might be considered given the patient's relatively low resting blood pressure.  FINDINGS: A total of approximately 5.2 L of straw-colored fluid was removed. Samples were sent to the laboratory as requested by the clinical team. IMPRESSION:  Successful ultrasound-guided paracentesis yielding 5.2 liters of peritoneal fluid.

## 2022-01-29 DIAGNOSIS — N179 Acute kidney failure, unspecified: Secondary | ICD-10-CM | POA: Diagnosis not present

## 2022-01-29 LAB — GLUCOSE, CAPILLARY
Glucose-Capillary: 96 mg/dL (ref 70–99)
Glucose-Capillary: 107 mg/dL — ABNORMAL HIGH (ref 70–99)
Glucose-Capillary: 152 mg/dL — ABNORMAL HIGH (ref 70–99)

## 2022-01-29 LAB — BASIC METABOLIC PANEL
Anion gap: 4 — ABNORMAL LOW (ref 5–15)
BUN: 35 mg/dL — ABNORMAL HIGH (ref 8–23)
CO2: 20 mmol/L — ABNORMAL LOW (ref 22–32)
Calcium: 8.1 mg/dL — ABNORMAL LOW (ref 8.9–10.3)
Chloride: 112 mmol/L — ABNORMAL HIGH (ref 98–111)
Creatinine, Ser: 1.65 mg/dL — ABNORMAL HIGH (ref 0.44–1.00)
GFR, Estimated: 34 mL/min — ABNORMAL LOW (ref >60)
Glucose, Bld: 105 mg/dL — ABNORMAL HIGH (ref 70–99)
Potassium: 5 mmol/L (ref 3.5–5.1)
Sodium: 136 mmol/L (ref 135–145)

## 2022-01-29 LAB — CYTOLOGY - NON PAP

## 2022-01-29 LAB — CBC
HCT: 22.8 % — ABNORMAL LOW (ref 36.0–46.0)
Hemoglobin: 7.1 g/dL — ABNORMAL LOW (ref 12.0–15.0)
MCH: 21.6 pg — ABNORMAL LOW (ref 26.0–34.0)
MCHC: 31.1 g/dL (ref 30.0–36.0)
MCV: 69.3 fL — ABNORMAL LOW (ref 80.0–100.0)
Platelets: 127 10*3/uL — ABNORMAL LOW (ref 150–400)
RBC: 3.29 MIL/uL — ABNORMAL LOW (ref 3.87–5.11)
RDW: 25.4 % — ABNORMAL HIGH (ref 11.5–15.5)
WBC: 3.6 10*3/uL — ABNORMAL LOW (ref 4.0–10.5)
nRBC: 0.6 % — ABNORMAL HIGH (ref 0.0–0.2)

## 2022-01-29 NOTE — Progress Notes (Signed)
PROGRESS NOTE   Rachel Huber  FTD:[id number] DOB: 24-Feb-1956 DOA: 01/27/2022 PCP: [name], MD   No chief complaint on file.  Level of care: Telemetry  Brief Admission History:  66 y.o. 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  Patient reports that she had bleeding hemorrhoids that started 4 days ago.  He was bleeding heavier 3 and 2 days ago, but later today.  She reports she started noticing that she was to fatigued and weak to walk across the house.  She did have to walk halfway take a rest and then finish the rest of the way.  Last time that she felt the symptoms was because she was too anemic.  She reports that that was about 16 months ago.  She denies chest pain, headache, paresthesias.  She does admit to shortness of breath but its not as bothersome to her as the fatigue and weakness.  Patient has no other complaints at this time.   Patient does not smoke, does not drink alcohol, does not use illicit drugs.  She is vaccinated for COVID.  Patient is full code.   Assessment and Plan: 1)AKI (acute kidney injury) ([place]) - Creatinine baseline 0.9  -Creatinine currently above 1.6 -Suspect secondary to anemia/GI bleed/volume depletion  Acute GI hemorrhage -- appreciate GI consult and recommendations -- -Patient received 3 units of PRBC this admission -Hgb down from 7.7 to 7.1 -Repeat CBC in a.m, if H&H stable may discharge home otherwise may need additional transfusion -Patient reports small amounts of rectal bleeding yesterday and today- will continue to monitor closely  Insomnia - Continue melatonin and Benadryl as needed  Decompensated  NASHhepatic cirrhosis (HCC) - Paracentesis with 8 L off every week prior to TIPS procedure -Last paracentesis June 13, -Has been evaluated by [facility]  for liver transplant -This is nonalcoholic fatty liver disease -Contributing to baseline anemia -Continue spironolactone -Volume of paracentesis improving after recent TIPS procedure that was done on 12/26/21  Depression - Continue Zoloft  HLD (hyperlipidemia) - Continue statin  Symptomatic anemia - With fatigue and dyspnea --Hgb down from 7.7 to 7.1 -Repeat CBC in a.m, if H&H stable may discharge home otherwise may need additional transfusion -Patient reports small amounts of rectal bleeding yesterday and today- will continue to monitor closely -Established patient of Dr. Delton Coombes where she can followup after DC     Diabetes mellitus, controlled ([place]) - Monitor CBGs -Carb modified diet -Sliding scale coverage  DVT prophylaxis: SCD Code Status: full  Family Communication:  Disposition: Status is: Inpatient Remains inpatient appropriate because: transfusing PRBC, unstable hemodynamics    Consultants:  GI Procedures:  Paracentesis 6/13 Antimicrobials:    Subjective: Had small amounts of rectal bleeding on 01/28/2022 on 01/29/2022 Fatigue and generalized weakness persist -Some dyspnea on exertion  Objective: Vitals:   01/28/22 1515 01/28/22 2055 01/29/22 0521 01/29/22 1243  BP: (!) 105/46 (!) 115/48 128/77 (!) 107/48  Pulse: 77 81 74 73  Resp: 18 20 18 20   Temp: 97.9 F (36.6 C) 98 F (36.7 C) 98 F (36.7 C) 97.9 F (36.6 C)  TempSrc: Oral Oral Oral Oral  SpO2: 100% 97% 99% 99%  Weight:      Height:        Intake/Output Summary (Last 24 hours) at 01/29/2022 1716 Last data filed at 01/29/2022 1327 Gross per 24 hour  Intake  1120.17 ml  Output 1 ml  Net 1119.17 ml   Filed Weights   01/27/22 1640  Weight: 117.9 kg   Examination:   Physical Exam  Gen:- Awake Alert, in no acute distress  HEENT:- [place].AT, No sclera icterus Neck-Supple Neck,No JVD,.  Lungs-  CTAB , fair air movement bilaterally  CV- S1, S2 normal, RRR Abd-  +ve B.Sounds, Abd Soft, No  tenderness, increased truncal adiposity Extremity/Skin:- +ve chronic edema,   good pedal pulses  Psych-affect is appropriate, oriented x3 Neuro-generalized weakness, no new focal deficits, no tremors   Data Reviewed: I have personally reviewed following labs and imaging studies  CBC: Recent Labs  Lab 01/27/22 1555 01/27/22 1739 01/28/22 0507 01/28/22 1707 01/29/22 0442  WBC 4.3 4.1 3.6*  --  3.6*  NEUTROABS 2.7 2.7 2.0  --   --   HGB 5.4* 5.6* 6.6* 7.7* 7.1*  HCT 18.3* 18.5* 21.2* 24.8* 22.8*  MCV 62.7* 62.9* 67.3*  --  69.3*  PLT 161 167 135*  --  127*    Basic Metabolic Panel: Recent Labs  Lab 01/27/22 1739 01/28/22 0507 01/29/22 0442  NA 136 136 136  K 4.8 4.6 5.0  CL 111 112* 112*  CO2 20* 19* 20*  GLUCOSE 183* 146* 105*  BUN 37* 36* 35*  CREATININE 1.73* 1.67* 1.65*  CALCIUM 8.4* 8.3* 8.1*  MG  --  2.2  --     CBG: Recent Labs  Lab 01/28/22 0026 01/28/22 2052 01/29/22 0724 01/29/22 1059  GLUCAP 120* 226* 96 107*    Recent Results (from the past 240 hour(s))  Acid Fast Smear (AFB)     Status: None   Collection Time: 01/28/22  1:00 PM   Specimen: Peritoneal Cavity; Body Fluid  Result Value Ref Range Status   AFB Specimen Processing Concentration  Final   Acid Fast Smear Negative  Final    Comment: (NOTE) Performed At: [facility], [place], [place] [id number] Rush Farmer MD PF:[id number]    Source (AFB) PERITONEAL  Final    Comment: Performed at [facility], [address] [place], [place] [address]  Culture, body fluid w Gram Stain-bottle     Status: None (Preliminary result)   Collection Time: 01/28/22  1:00 PM   Specimen: Peritoneal Washings  Result Value Ref Range Status   Specimen Description PERITONEAL  Final   Special Requests   Final    BOTTLES DRAWN AEROBIC AND ANAEROBIC Blood Culture adequate volume   Culture   Final    NO GROWTH < 24 HOURS Performed at [facility], [address] [place], [place]  [address]    Report Status PENDING  Incomplete  Gram stain     Status: None   Collection Time: 01/28/22  1:00 PM   Specimen: Peritoneal Washings  Result Value Ref Range Status   Specimen Description PERITONEAL  Final   Special Requests NONE  Final   Gram Stain   Final    CYTOSPIN SMEAR WBC PRESENT,BOTH PMN AND MONONUCLEAR NO ORGANISMS SEEN Performed at [facility], [address], [place], [place] [address]    Report Status 01/28/2022 FINAL  Final     Radiology Studies: US Paracentesis  Result Date: 01/28/2022 Sherryl Barters, MD     01/28/2022  2:00 PM INDICATION: Ascites EXAM: ULTRASOUND GUIDED RIGHT PARACENTESIS GRIP-IR: Category: Fluids Subcategory: Paracentesis Follow-Up: None MEDICATIONS: None. COMPLICATIONS: None immediate. PROCEDURE: Informed written consent was obtained from the patient after a discussion of the risks (including hemorrhage  and infection, among others), benefits and alternatives to treatment. A timeout was performed prior to the initiation of the procedure. Initial ultrasound scanning demonstrates a large amount of ascites within the right lower abdominal quadrant. The right lower abdomen was prepped and draped in the usual sterile fashion. 1% lidocaine  was used for local anesthesia. Following this, a Yueh catheter was introduced. An ultrasound image was saved for documentation purposes. The paracentesis was performed. The catheter was removed and a dressing was applied. The patient tolerated the procedure well without immediate post procedural complication. Postprocedural IV albumin is not necessarily required but might be considered given the patient's relatively low resting blood pressure. FINDINGS: A total of approximately 5.2 L of straw-colored fluid was removed. Samples were sent to the laboratory as requested by the clinical team. IMPRESSION: Successful ultrasound-guided paracentesis yielding 5.2 liters of peritoneal fluid.   DG CHEST PORT 1 VIEW  Result Date:  01/27/2022 CLINICAL DATA:  Shortness of breath and wheezing. EXAM: PORTABLE CHEST 1 VIEW COMPARISON:  Chest radiograph dated 10/01/2021. FINDINGS: No focal consolidation, pleural effusion, pneumothorax. Top-normal cardiac size. No acute osseous pathology. Right shoulder arthroplasty. IMPRESSION: No active disease. Electronically Signed   By: Anner Crete M.D.   On: 01/27/2022 21:31    Scheduled Meds:  atorvastatin  10 mg Oral Daily   gabapentin  800 mg Oral BID   hydrocortisone  25 mg Rectal BID   insulin aspart  0-15 Units Subcutaneous TID WC   insulin aspart  0-5 Units Subcutaneous QHS   polyethylene glycol  17 g Oral Daily   sertraline  50 mg Oral Daily   Continuous Infusions:  sodium chloride       LOS: 1 day   Roxan Hockey, MD How to contact the [facility] Attending or Consulting provider [place] or covering provider during after hours [place], for this patient?  Check the care team in [facility] and look for a) attending/consulting TRH provider listed and b) the [facility] team listed Log into [email or web address] and use [facility]'s universal password to access. If you do not have the password, please contact the hospital operator. Locate the [facility] provider you are looking for under [facility] and page to a number that you can be directly reached. If you still have difficulty reaching the provider, please page the [facility] (Director on Call) for the Hospitalists listed on amion for assistance.  01/29/2022, 5:16 PM

## 2022-01-30 ENCOUNTER — Ambulatory Visit (HOSPITAL_COMMUNITY): Payer: 59

## 2022-01-30 DIAGNOSIS — N179 Acute kidney failure, unspecified: Secondary | ICD-10-CM | POA: Diagnosis not present

## 2022-01-30 DIAGNOSIS — K922 Gastrointestinal hemorrhage, unspecified: Secondary | ICD-10-CM | POA: Diagnosis not present

## 2022-01-30 DIAGNOSIS — D649 Anemia, unspecified: Secondary | ICD-10-CM | POA: Diagnosis not present

## 2022-01-30 LAB — CBC
HCT: 22.6 % — ABNORMAL LOW (ref 36.0–46.0)
Hemoglobin: 7 g/dL — ABNORMAL LOW (ref 12.0–15.0)
MCH: 21.7 pg — ABNORMAL LOW (ref 26.0–34.0)
MCHC: 31 g/dL (ref 30.0–36.0)
MCV: 70.2 fL — ABNORMAL LOW (ref 80.0–100.0)
Platelets: 121 10*3/uL — ABNORMAL LOW (ref 150–400)
RBC: 3.22 MIL/uL — ABNORMAL LOW (ref 3.87–5.11)
RDW: 25.9 % — ABNORMAL HIGH (ref 11.5–15.5)
WBC: 3.6 10*3/uL — ABNORMAL LOW (ref 4.0–10.5)
nRBC: 0 % (ref 0.0–0.2)

## 2022-01-30 LAB — GLUCOSE, CAPILLARY
Glucose-Capillary: 133 mg/dL — ABNORMAL HIGH (ref 70–99)
Glucose-Capillary: 131 mg/dL — ABNORMAL HIGH (ref 70–99)

## 2022-01-30 LAB — ACID FAST SMEAR (AFB, MYCOBACTERIA): Acid Fast Smear: NEGATIVE

## 2022-01-30 MED ORDER — FUROSEMIDE 10 MG/ML IJ SOLN
20.0000 mg | Freq: Once | INTRAMUSCULAR | Status: AC
  Administered 2022-01-30: 20 mg via INTRAVENOUS
  Filled 2022-01-30: qty 2

## 2022-01-30 MED ORDER — LACTULOSE ENCEPHALOPATHY 10 GM/15ML PO SOLN: 20.0000 g | Freq: Every day | ORAL | 1 refills | Status: DC

## 2022-01-30 MED ORDER — SODIUM CHLORIDE 0.9% IV SOLUTION: Freq: Once | INTRAVENOUS | Status: AC

## 2022-01-30 MED ORDER — ONDANSETRON 4 MG PO TBDP: 4.0000 mg | ORAL_TABLET | Freq: Three times a day (TID) | ORAL | 0 refills | Status: DC | PRN

## 2022-01-30 MED ORDER — HYDROCORTISONE ACETATE 25 MG RE SUPP: 25.0000 mg | Freq: Two times a day (BID) | RECTAL | 5 refills | Status: DC

## 2022-01-30 MED ORDER — PANTOPRAZOLE SODIUM 40 MG PO TBEC: 40.0000 mg | DELAYED_RELEASE_TABLET | Freq: Every day | ORAL | 3 refills | Status: DC

## 2022-01-30 NOTE — Progress Notes (Signed)
Nsg Discharge Note  Admit Date:  01/27/2022 Discharge date: 01/30/2022   Lelon Frohlich A Roza to be D/C'd Home per MD order.  AVS completed.   Removed IV-CDI. Glenard Haring RN reviewed d/c paperwork with patient. Answered all questions. Wheeled stable patient and belongings to main entrance where she was picked up by her husband.Patient/caregiver able to verbalize understanding.  Discharge Medication: Allergies as of 01/30/2022       Reactions   Latex Rash   Contact rash        Medication List     STOP taking these medications    azelastine 0.1 % nasal spray Commonly known as: ASTELIN   ciprofloxacin 500 MG tablet Commonly known as: CIPRO       TAKE these medications    acetaminophen 650 MG CR tablet Commonly known as: TYLENOL Take 1,300 mg by mouth daily as needed for pain.   atorvastatin 10 MG tablet Commonly known as: LIPITOR Take 10 mg by mouth daily.   Cholecalciferol 50 MCG (2000 UT) Caps Take by mouth.   diphenhydrAMINE 25 MG tablet Commonly known as: BENADRYL Take 25 mg by mouth 2 (two) times daily as needed for itching.   gabapentin 400 MG capsule Commonly known as: NEURONTIN Take 800 mg by mouth daily at 2 am.   guaiFENesin-dextromethorphan 100-10 MG/5ML syrup Commonly known as: ROBITUSSIN DM Take 5 mLs by mouth every 4 (four) hours as needed for cough.   hydrocortisone 25 MG suppository Commonly known as: ANUSOL-HC Place 1 suppository (25 mg total) rectally 2 (two) times daily. What changed:  how much to take when to take this   lactulose (encephalopathy) 10 GM/15ML Soln Commonly known as: CHRONULAC Take 30 mLs (20 g total) by mouth daily.   melatonin 5 MG Tabs Take 10 mg by mouth at bedtime as needed (sleep).   multivitamin with minerals tablet Take 1 tablet by mouth daily.   ondansetron 4 MG disintegrating tablet Commonly known as: ZOFRAN-ODT Take 1 tablet (4 mg total) by mouth every 8 (eight) hours as needed for nausea or vomiting. What  changed:  how much to take when to take this reasons to take this   oxymetazoline 0.05 % nasal spray Commonly known as: AFRIN Place 1 spray into both nostrils 2 (two) times daily as needed.   pantoprazole 40 MG tablet Commonly known as: Protonix Take 1 tablet (40 mg total) by mouth daily.   sertraline 50 MG tablet Commonly known as: ZOLOFT Take 50 mg by mouth daily.   vitamin B-12 1000 MCG tablet Commonly known as: CYANOCOBALAMIN Take 1,000 mcg by mouth daily.        Discharge Assessment: Vitals:   01/30/22 1053 01/30/22 1421  BP: (!) 104/45 (!) 115/57  Pulse: 72 74  Resp: 18 18  Temp: 97.8 F (36.6 C) 98.1 F (36.7 C)  SpO2:  100%   Skin clean, dry and intact without evidence of skin break down, no evidence of skin tears noted. IV catheter discontinued intact. Site without signs and symptoms of complications - no redness or edema noted at insertion site, patient denies c/o pain - only slight tenderness at site.  Dressing with slight pressure applied.  D/c Instructions-Education: Discharge instructions given to patient/family with verbalized understanding. D/c education completed with patient/family including follow up instructions, medication list, d/c activities limitations if indicated, with other d/c instructions as indicated by MD - patient able to verbalize understanding, all questions fully answered. Patient instructed to return to ED, call 911, or call MD  for any changes in condition.  Patient escorted via [place], and D/C home via private auto.  Santa Lighter, RN 01/30/2022 5:56 PM

## 2022-01-30 NOTE — Plan of Care (Signed)
  Problem: Education: Goal: Ability to describe self-care measures that may prevent or decrease complications (Diabetes Survival Skills Education) will improve Outcome: Progressing   Problem: Coping: Goal: Ability to adjust to condition or change in health will improve Outcome: Progressing   Problem: Education: Goal: Knowledge of General Education information will improve Description: Including pain rating scale, medication(s)/side effects and non-pharmacologic comfort measures Outcome: Progressing   Problem: Health Behavior/Discharge Planning: Goal: Ability to manage health-related needs will improve Outcome: Progressing

## 2022-01-30 NOTE — Discharge Instructions (Signed)
1)Take Lactulose as prescribed with Goal of having 2 to 3 soft/semi solid stool per day 2)Avoid ibuprofen/Advil/Aleve/Motrin/Goody Powders/Naproxen/BC powders/Meloxicam/Diclofenac/Indomethacin and other Nonsteroidal anti-inflammatory medications as these will make you more likely to bleed and can cause stomach ulcers, can also cause Kidney problems.  3)Repeat CBC Blood test around Monday 02/03/22 4)follow up with Gi/gastroenterologist in about a week or so

## 2022-01-30 NOTE — Discharge Summary (Incomplete)
Rachel Huber, is a 66 y.o. female  DOB Jul 15, 1956  MRN [id number].  Admission date:  01/27/2022  Admitting Physician  Murlean Iba, MD  Discharge Date:  01/30/2022   Primary MD  [name], MD  Recommendations for primary care physician for things to follow:   1)Take Lactulose as prescribed with Goal of having 2 to 3 soft/semi solid stool per day 2)Avoid ibuprofen/Advil/Aleve/Motrin/Goody Powders/Naproxen/BC powders/Meloxicam/Diclofenac/Indomethacin and other Nonsteroidal anti-inflammatory medications as these will make you more likely to bleed and can cause stomach ulcers, can also cause Kidney problems.  3)Repeat CBC Blood test around Monday 02/03/22 4)follow up with Gi/gastroenterologist in about a week or so  Admission Diagnosis  AKI (acute kidney injury) ([place]) [N17.9] Severe anemia [D64.9] Acute GI hemorrhage [K92.2]   Discharge Diagnosis  AKI (acute kidney injury) ([place]) [N17.9] Severe anemia [D64.9] Acute GI hemorrhage [K92.2]  ***  Principal Problem:   AKI (acute kidney injury) ([place]) Active Problems:   Diabetes mellitus, controlled ([place])   Symptomatic anemia   HLD (hyperlipidemia)   Depression   Decompensated hepatic cirrhosis (HCC)   Insomnia   Acute GI hemorrhage      Past Medical History:  Diagnosis Date   Anxiety    Arthritis    Cirrhosis of liver ([place])    Diabetes mellitus without complication (HCC)    Dyspnea    DOE   GERD (gastroesophageal reflux disease)    Hepatitis    PAST   Hypertension    Neuropathy    Neuropathy, diabetic ([place])    Pneumonia    Sinus complaint    Sleep apnea    CPAP   Thalassemia minor 1992    Past Surgical History:  Procedure Laterality Date   ABDOMINAL HYSTERECTOMY     BREAST BIOPSY Right 02/15/2018   [place] bx 6-6:30 ribbon shape, ONE CORE FRAGMENT WITH FIBROSIS. ONE CORE FRAGMENT WITH PORTION OF A DILATED   BREAST BIOPSY  Right 02/15/2018   [place] bx 9:00 heart shape, USUAL DUCTAL HYPERPLASIA   BREAST LUMPECTOMY Right 03/09/2018   Procedure: BREAST LUMPECTOMY x 2;  Surgeon: Benjamine Sprague, DO;  Location: [facility] ORS;  Service: General;  Laterality: Right;   CATARACT EXTRACTION W/PHACO Left 06/11/2016   Procedure: CATARACT EXTRACTION PHACO AND INTRAOCULAR LENS PLACEMENT (IOC);  Surgeon: Estill Cotta, MD;  Location: [facility] ORS;  Service: Ophthalmology;  Laterality: Left;  Lot # [id number] H US:01:38.6 AP%:26.4 CDE:44.15   CATARACT EXTRACTION W/PHACO Right 06/15/2018   Procedure: CATARACT EXTRACTION PHACO AND INTRAOCULAR LENS PLACEMENT (IOC);  Surgeon: Birder Robson, MD;  Location: [facility] ORS;  Service: Ophthalmology;  Laterality: Right;  [place] 00:38.2 CDE 4.23 Fluid Pack Lot # [id number]   COLONOSCOPIES     COLONOSCOPY WITH PROPOFOL N/A 10/10/2020   Procedure: COLONOSCOPY WITH PROPOFOL;  Surgeon: Lesly Rubenstein, MD;  Location[facility] ENDOSCOPY;  Service: Endoscopy;  Laterality: N/A;   COLONOSCOPY WITH PROPOFOL N/A 11/20/2020   Procedure: COLONOSCOPY WITH PROPOFOL;  Surgeon: Lesly Rubenstein, MD;  Location: [facility] ENDOSCOPY;  Service: Endoscopy;  Laterality:  N/A;  DM STAT CBC, BMP COVID POSITIVE 09/02/2020   CTR     ESOPHAGOGASTRODUODENOSCOPY (EGD) WITH PROPOFOL N/A 10/10/2020   Procedure: ESOPHAGOGASTRODUODENOSCOPY (EGD) WITH PROPOFOL;  Surgeon: Lesly Rubenstein, MD;  Location: [facility] ENDOSCOPY;  Service: Endoscopy;  Laterality: N/A;  COVID POSITIVE 10/08/2020   IR PARACENTESIS  12/17/2021   JOINT REPLACEMENT     KNEE SURGERY Right 09/24/2017   plates and pins   TOTAL SHOULDER ARTHROPLASTY Right 09/27/2015       HPI  from the history and physical done on the day of admission:     ***  ****     Hospital Course:     66 y.o. 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  Patient reports that she had bleeding hemorrhoids that started 4 days ago.  He was bleeding heavier 3 and 2 days ago, but later today.  She reports she started noticing that she was to fatigued and weak to walk across the house.  She did have to walk halfway take a rest and then finish the rest of the way.  Last time that she felt the symptoms was because she was too anemic.  She reports that that was about 16 months ago.  She denies chest pain, headache, paresthesias.  She does admit to shortness of breath but its not as bothersome to her as the fatigue and weakness.  Patient has no other complaints at this time.   Patient does not smoke, does not drink alcohol, does not use illicit drugs.  She is vaccinated for COVID.  Patient is full code.  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 - Creatinine baseline 0.9  -Creatinine currently above 1.6 -Suspect secondary to anemia/GI bleed/volume depletion   Acute GI hemorrhage -- appreciate GI consult and recommendations -- -Patient received 3 units of PRBC this admission -Hgb down from 7.7 to 7.1 -Repeat CBC in a.m, if H&H stable may discharge home otherwise may need additional transfusion -Patient reports small amounts of rectal bleeding yesterday and today- will continue to monitor closely   Insomnia - Continue melatonin and Benadryl as needed   Decompensated  NASHhepatic cirrhosis (HCC) - Paracentesis with 8 L off every week prior to TIPS procedure -Last paracentesis June 13, -Has been evaluated by [facility] for liver transplant -This is nonalcoholic fatty liver disease -Contributing to baseline anemia -Continue spironolactone -Volume of paracentesis improving after recent TIPS procedure that was done on 12/26/21   Depression - Continue Zoloft   HLD (hyperlipidemia) - Continue statin   Acute on chronic symptomatic anemia-due to acute blood loss/GI bleed--this is superimposed  on underlying chronic iron deficiency anemia and anemia related to thalassemia - With fatigue and dyspnea --Hgb down from 7.7 to 7.1 -Repeat CBC in a.m, if H&H stable may discharge home otherwise may need additional transfusion -Patient reports small amounts of rectal bleeding yesterday and today- will continue to monitor closely -Established patient of Dr. Delton Coombes where she can followup after DC   Pancytopenia--patient with with leukopenia, anemia and thrombocytopenia in the setting of liver cirrhosis recurrent GI bleed   Diabetes mellitus, controlled ([place]) - Monitor CBGs -Carb modified diet -Sliding scale coverage     Discharge Condition: ***  Follow UP     Consults obtained - ***  Diet and Activity recommendation:  As advised  Discharge Instructions    ****  Discharge Instructions     Call MD for:  difficulty breathing, headache or visual disturbances   Complete by: As directed    Call MD for:  persistant dizziness or light-headedness   Complete by: As directed    Call MD for:  persistant nausea and vomiting   Complete by: As directed    Call MD for:  temperature >100.4   Complete by: As directed    Diet - low sodium heart healthy   Complete by: As directed    Discharge instructions   Complete by: As directed    1)Take Lactulose as prescribed with Goal of having 2 to 3 soft/semi solid stool per day 2)Avoid ibuprofen/Advil/Aleve/Motrin/Goody Powders/Naproxen/BC powders/Meloxicam/Diclofenac/Indomethacin and other Nonsteroidal anti-inflammatory medications as these will make you more likely to bleed and can cause stomach ulcers, can also cause Kidney problems.  3)Repeat CBC Blood test around Monday 02/03/22 4)follow up with Gi/gastroenterologist in about a week or so   Increase activity slowly   Complete by: As directed          Discharge Medications     Allergies as of 01/30/2022       Reactions   Latex Rash   Contact rash        Medication List      STOP taking these medications    azelastine 0.1 % nasal spray Commonly known as: ASTELIN   ciprofloxacin 500 MG tablet Commonly known as: CIPRO       TAKE these medications    acetaminophen 650 MG CR tablet Commonly known as: TYLENOL Take 1,300 mg by mouth daily as needed for pain.   atorvastatin 10 MG tablet Commonly known as: LIPITOR Take 10 mg by mouth daily.   Cholecalciferol 50 MCG (2000 UT) Caps Take by mouth.   diphenhydrAMINE 25 MG tablet Commonly known as: BENADRYL Take 25 mg by mouth 2 (two) times daily as needed for itching.   gabapentin 400 MG capsule Commonly known as: NEURONTIN Take 800 mg by mouth daily at 2 am.   guaiFENesin-dextromethorphan 100-10 MG/5ML syrup Commonly known as: ROBITUSSIN DM Take 5 mLs by mouth every 4 (four) hours as needed for cough.   hydrocortisone 25 MG suppository Commonly known as: ANUSOL-HC Place 1 suppository (25 mg total) rectally 2 (two) times daily. What changed:  how much to take when to take this   lactulose (encephalopathy) 10 GM/15ML Soln Commonly known as: CHRONULAC Take 30 mLs (20 g total) by mouth daily.   melatonin 5 MG Tabs Take 10 mg by mouth at bedtime as needed (sleep).   multivitamin with minerals tablet Take 1 tablet by mouth daily.   ondansetron 4 MG disintegrating tablet Commonly known as: ZOFRAN-ODT Take 1 tablet (4 mg total) by mouth every 8 (eight) hours as needed for nausea or vomiting. What changed:  how much to take when to take this reasons to take this   oxymetazoline 0.05 % nasal spray Commonly known as: AFRIN Place 1 spray into both nostrils 2 (two) times daily as needed.   pantoprazole 40 MG tablet Commonly known as: Protonix Take 1 tablet (40 mg total) by mouth daily.   sertraline 50 MG tablet Commonly known as: ZOLOFT Take 50 mg by mouth daily.   vitamin B-12 1000 MCG tablet Commonly known as: CYANOCOBALAMIN Take 1,000 mcg by mouth daily.        Major  procedures and Radiology Reports - PLEASE review detailed and final reports for all details, in brief -   ***  US Paracentesis  Result Date: 01/28/2022 Sherryl Barters, MD     01/28/2022  2:00 PM INDICATION: Ascites EXAM: ULTRASOUND GUIDED RIGHT PARACENTESIS GRIP-IR: Category: Fluids Subcategory: Paracentesis Follow-Up: None MEDICATIONS: None. COMPLICATIONS: None immediate. PROCEDURE: Informed written consent was obtained from the patient after a discussion of the risks (including hemorrhage and infection, among others), benefits and alternatives to treatment. A timeout was performed prior to the initiation of the procedure. Initial ultrasound scanning demonstrates a large amount of ascites within the right lower abdominal quadrant. The right lower abdomen was prepped and draped in the usual sterile fashion. 1% lidocaine  was used for local anesthesia. Following this, a Yueh catheter was introduced. An ultrasound image was saved for documentation purposes. The paracentesis was performed. The catheter was removed and a dressing was applied. The patient tolerated the procedure well without immediate post procedural complication. Postprocedural IV albumin is not necessarily required but might be considered given the patient's relatively low resting blood pressure. FINDINGS: A total of approximately 5.2 L of straw-colored fluid was removed. Samples were sent to the laboratory as requested by the clinical team. IMPRESSION: Successful ultrasound-guided paracentesis yielding 5.2 liters of peritoneal fluid.   DG CHEST PORT 1 VIEW  Result Date: 01/27/2022 CLINICAL DATA:  Shortness of breath and wheezing. EXAM: PORTABLE CHEST 1 VIEW COMPARISON:  Chest radiograph dated 10/01/2021. FINDINGS: No focal consolidation, pleural effusion, pneumothorax. Top-normal cardiac size. No acute osseous pathology. Right shoulder arthroplasty. IMPRESSION: No active disease. Electronically Signed   By: Anner Crete M.D.   On:  01/27/2022 21:31   US Paracentesis  Result Date: 01/21/2022 INDICATION: Patient with history of NASH cirrhosis recurrent ascites status post recent TIPS procedure, maximum volume of 8 L to be drawn off per ordering provider. EXAM: ULTRASOUND GUIDED  PARACENTESIS MEDICATIONS: Local 1% lidocaine only. COMPLICATIONS: None immediate. PROCEDURE: Informed written consent was obtained from the patient after a discussion of the risks, benefits and alternatives to treatment. A timeout was performed prior to the initiation of the procedure. Initial ultrasound scanning demonstrates a large amount of ascites within the left lower abdominal quadrant. The left lower abdomen was prepped and draped in the usual sterile fashion. 1% lidocaine was used for local anesthesia. Following this, a 19 gauge, 10-cm, Yueh catheter was introduced. An ultrasound image was saved for documentation purposes. The paracentesis was performed. The catheter was removed and a dressing was applied. The patient tolerated the procedure well without immediate post procedural complication. Patient received post-procedure intravenous albumin; see nursing notes for details. FINDINGS: A total of approximately 7.1 Liters of clear yellow fluid was removed. IMPRESSION: Successful ultrasound-guided paracentesis yielding 7.1 Liters liters of peritoneal fluid. The patient has reportedly recently had a TIPS at [facility] on 12/26/2021, and is followed by the liver clinic there. This exam was performed by Tsosie Billing PA-C, and was supervised and interpreted by Dr. Annamaria Boots. Electronically Signed   By: Jerilynn Mages.  Shick M.D.   On: 01/21/2022 11:06   US Paracentesis  Result Date: 01/14/2022 INDICATION: Patient with history of NASH cirrhosis recurrent ascites status post recent TIPS procedure, maximum volume of 8 L to be drawn off per ordering provider. EXAM: ULTRASOUND GUIDED  PARACENTESIS MEDICATIONS: Local 1% lidocaine only. COMPLICATIONS: None immediate.  PROCEDURE: Informed written consent was obtained from the patient after a discussion of the risks, benefits and alternatives to treatment. A timeout was performed prior to the initiation of the procedure. Initial ultrasound scanning demonstrates a large amount of ascites within the right lower  abdominal quadrant. The right lower abdomen was prepped and draped in the usual sterile fashion. 1% lidocaine was used for local anesthesia. Following this, a 19 gauge, 10-cm, Yueh catheter was introduced. An ultrasound image was saved for documentation purposes. The paracentesis was performed. The catheter was removed and a dressing was applied. The patient tolerated the procedure well without immediate post procedural complication. Patient received post-procedure intravenous albumin; see nursing notes for details. FINDINGS: A total of approximately 8 L of clear yellow fluid was removed. IMPRESSION: Successful ultrasound-guided paracentesis yielding 8 liters of peritoneal fluid. The patient has reportedly recently had a TIPS at [facility] on 12/26/2021, and is followed by the liver clinic there. This exam was performed by Tsosie Billing PA-C, and was supervised and interpreted by Dr. Laurence Ferrari. Electronically Signed   By: Jacqulynn Cadet M.D.   On: 01/14/2022 15:13   US Paracentesis  Result Date: 01/07/2022 INDICATION: Patient with history of NASH cirrhosis recurrent ascites status post recent tips procedure, maximum volume of 8 L to be drawn off per ordering provider. EXAM: ULTRASOUND GUIDED THERAPEUTIC PARACENTESIS MEDICATIONS: Local 1% lidocaine only. COMPLICATIONS: None immediate. PROCEDURE: Informed written consent was obtained from the patient after a discussion of the risks, benefits and alternatives to treatment. A timeout was performed prior to the initiation of the procedure. Initial ultrasound scanning demonstrates a large amount of ascites within the left lower abdominal quadrant. The left  lower abdomen was prepped and draped in the usual sterile fashion. 1% lidocaine was used for local anesthesia. Following this, a 19 gauge, 10-cm, Yueh catheter was introduced. An ultrasound image was saved for documentation purposes. The paracentesis was performed. The catheter was removed and a dressing was applied. The patient tolerated the procedure well without immediate post procedural complication. Patient received post-procedure intravenous albumin; see nursing notes for details. This exam was performed by Tsosie Billing PA-C, and was supervised and interpreted by Dr. Maryelizabeth Kaufmann. FINDINGS: A total of approximately 8 L of clear yellow fluid was removed. IMPRESSION: Successful ultrasound-guided therapeutic paracentesis yielding 8 liters of peritoneal fluid. PLAN: The patient has reportedly recently had a TIPS at [facility] on 12/26/2021, and is followed by the liver clinic there. She is therefore not a candidate for evaluation at the [facility] interventional radiology portal hypertension clinic. Michaelle Birks, MD [facility] Electronically Signed   By: Michaelle Birks M.D.   On: 01/07/2022 14:31    Micro Results   *** Recent Results (from the past 240 hour(s))  Acid Fast Smear (AFB)     Status: None   Collection Time: 01/28/22  1:00 PM   Specimen: Peritoneal Cavity; Body Fluid  Result Value Ref Range Status   AFB Specimen Processing Concentration  Final   Acid Fast Smear Negative  Final    Comment: (NOTE) Performed At: [facility] [place], [place] [id number] Rush Farmer MD HU:[id number]    Source (AFB) PERITONEAL  Final    Comment: Performed at [facility], [address] [place], [place] [address]  Culture, body fluid w Gram Stain-bottle     Status: None (Preliminary result)   Collection Time: 01/28/22  1:00 PM   Specimen: Peritoneal Washings  Result Value Ref Range Status   Specimen Description  PERITONEAL  Final   Special Requests   Final    BOTTLES DRAWN AEROBIC AND ANAEROBIC Blood Culture adequate volume   Culture   Final    NO GROWTH 2 DAYS Performed at  [facility], [address], [place], [place] [address]    Report Status PENDING  Incomplete  Gram stain     Status: None   Collection Time: 01/28/22  1:00 PM   Specimen: Peritoneal Washings  Result Value Ref Range Status   Specimen Description PERITONEAL  Final   Special Requests NONE  Final   Gram Stain   Final    CYTOSPIN SMEAR WBC PRESENT,BOTH PMN AND MONONUCLEAR NO ORGANISMS SEEN Performed at [facility], [address] [place], [place] [address]    Report Status 01/28/2022 FINAL  Final    Today   Subjective    Mervin Hack today has no ***          Patient has been seen and examined prior to discharge   Objective   Blood pressure (!) 115/57, pulse 74, temperature 98.1 F (36.7 C), temperature source Oral, resp. rate 18, height 5' 9"  (1.753 m), weight 117.9 kg, SpO2 100 %.   Intake/Output Summary (Last 24 hours) at 01/30/2022 1527 Last data filed at 01/30/2022 1410 Gross per 24 hour  Intake 904 ml  Output --  Net 904 ml    Exam Gen:- Awake Alert, no acute distress *** HEENT:- [place].AT, No sclera icterus Neck-Supple Neck,No JVD,.  Lungs-  CTAB , good air movement bilaterally CV- S1, S2 normal, regular Abd-  +ve B.Sounds, Abd Soft, No tenderness,    Extremity/Skin:- No  edema,   good pulses Psych-affect is appropriate, oriented x3 Neuro-no new focal deficits, no tremors ***   Data Review   CBC w Diff:  Lab Results  Component Value Date   WBC 3.6 (L) 01/30/2022   HGB 7.0 (L) 01/30/2022   HCT 22.6 (L) 01/30/2022   PLT 121 (L) 01/30/2022   LYMPHOPCT 25 01/28/2022   MONOPCT 13 01/28/2022   EOSPCT 7 01/28/2022   BASOPCT 1 01/28/2022    CMP:  Lab Results  Component Value Date   NA 136 01/29/2022   K 5.0 01/29/2022   CL 112 (H) 01/29/2022   CO2 20 (L) 01/29/2022   BUN 35 (H) 01/29/2022    CREATININE 1.65 (H) 01/29/2022   PROT 4.6 (L) 01/28/2022   ALBUMIN 2.5 (L) 01/28/2022   BILITOT 2.1 (H) 01/28/2022   ALKPHOS 88 01/28/2022   AST 33 01/28/2022   ALT 18 01/28/2022  .  Total Discharge time is about 33 minutes  Roxan Hockey M.D on 01/30/2022 at 3:27 PM  Go to [email or web address] -  for contact info  [facility] - Office  [phone]

## 2022-01-30 NOTE — Plan of Care (Signed)
Problem: Education: Goal: Ability to describe self-care measures that may prevent or decrease complications (Diabetes Survival Skills Education) will improve 01/30/2022 1204 by Santa Lighter, RN Outcome: Adequate for Discharge 01/30/2022 1203 by Santa Lighter, RN Outcome: Progressing Goal: Individualized Educational Video(s) Outcome: Adequate for Discharge   Problem: Coping: Goal: Ability to adjust to condition or change in health will improve 01/30/2022 1204 by Santa Lighter, RN Outcome: Adequate for Discharge 01/30/2022 1203 by Santa Lighter, RN Outcome: Progressing   Problem: Fluid Volume: Goal: Ability to maintain a balanced intake and output will improve Outcome: Adequate for Discharge   Problem: Health Behavior/Discharge Planning: Goal: Ability to identify and utilize available resources and services will improve Outcome: Adequate for Discharge Goal: Ability to manage health-related needs will improve Outcome: Adequate for Discharge   Problem: Metabolic: Goal: Ability to maintain appropriate glucose levels will improve Outcome: Adequate for Discharge   Problem: Nutritional: Goal: Maintenance of adequate nutrition will improve Outcome: Adequate for Discharge Goal: Progress toward achieving an optimal weight will improve Outcome: Adequate for Discharge   Problem: Skin Integrity: Goal: Risk for impaired skin integrity will decrease Outcome: Adequate for Discharge   Problem: Tissue Perfusion: Goal: Adequacy of tissue perfusion will improve Outcome: Adequate for Discharge   Problem: Education: Goal: Knowledge of General Education information will improve Description: Including pain rating scale, medication(s)/side effects and non-pharmacologic comfort measures 01/30/2022 1204 by Santa Lighter, RN Outcome: Adequate for Discharge 01/30/2022 1203 by Santa Lighter, RN Outcome: Progressing   Problem: Health Behavior/Discharge Planning: Goal: Ability to manage  health-related needs will improve 01/30/2022 1204 by Santa Lighter, RN Outcome: Adequate for Discharge 01/30/2022 1203 by Santa Lighter, RN Outcome: Progressing   Problem: Clinical Measurements: Goal: Ability to maintain clinical measurements within normal limits will improve Outcome: Adequate for Discharge Goal: Will remain free from infection Outcome: Adequate for Discharge Goal: Diagnostic test results will improve Outcome: Adequate for Discharge Goal: Respiratory complications will improve Outcome: Adequate for Discharge Goal: Cardiovascular complication will be avoided Outcome: Adequate for Discharge   Problem: Activity: Goal: Risk for activity intolerance will decrease Outcome: Adequate for Discharge   Problem: Nutrition: Goal: Adequate nutrition will be maintained Outcome: Adequate for Discharge   Problem: Coping: Goal: Level of anxiety will decrease Outcome: Adequate for Discharge   Problem: Elimination: Goal: Will not experience complications related to bowel motility Outcome: Adequate for Discharge Goal: Will not experience complications related to urinary retention Outcome: Adequate for Discharge   Problem: Pain Managment: Goal: General experience of comfort will improve Outcome: Adequate for Discharge   Problem: Safety: Goal: Ability to remain free from injury will improve Outcome: Adequate for Discharge   Problem: Skin Integrity: Goal: Risk for impaired skin integrity will decrease Outcome: Adequate for Discharge   Problem: Education: Goal: Knowledge of General Education information will improve Description: Including pain rating scale, medication(s)/side effects and non-pharmacologic comfort measures Outcome: Adequate for Discharge   Problem: Health Behavior/Discharge Planning: Goal: Ability to manage health-related needs will improve Outcome: Adequate for Discharge   Problem: Clinical Measurements: Goal: Ability to maintain clinical  measurements within normal limits will improve Outcome: Adequate for Discharge Goal: Will remain free from infection Outcome: Adequate for Discharge Goal: Diagnostic test results will improve Outcome: Adequate for Discharge Goal: Respiratory complications will improve Outcome: Adequate for Discharge Goal: Cardiovascular complication will be avoided Outcome: Adequate for Discharge   Problem: Activity: Goal: Risk for activity intolerance will decrease Outcome: Adequate for Discharge   Problem: Nutrition: Goal: Adequate nutrition will be maintained Outcome: Adequate  for Discharge   Problem: Coping: Goal: Level of anxiety will decrease Outcome: Adequate for Discharge   Problem: Elimination: Goal: Will not experience complications related to bowel motility Outcome: Adequate for Discharge Goal: Will not experience complications related to urinary retention Outcome: Adequate for Discharge   Problem: Pain Managment: Goal: General experience of comfort will improve Outcome: Adequate for Discharge   Problem: Safety: Goal: Ability to remain free from injury will improve Outcome: Adequate for Discharge   Problem: Skin Integrity: Goal: Risk for impaired skin integrity will decrease Outcome: Adequate for Discharge

## 2022-01-30 NOTE — Plan of Care (Signed)
Pt is alert and oriented x4. Med compliant. Pt received 2 prns melatonin and tylenol. 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 Pt up ad lib in room with cane. Steady gait. Vitals stable. Awaiting lab results for hgb this am . Problem: Education: Goal: Ability to describe self-care measures that may prevent or decrease complications (Diabetes Survival Skills Education) will improve Outcome: Progressing Goal: Individualized Educational Video(s) Outcome: Progressing   Problem: Coping: Goal: Ability to adjust to condition or change in health will improve Outcome: Progressing   Problem: Fluid Volume: Goal: Ability to maintain a balanced intake and output will improve Outcome: Progressing   Problem: Health Behavior/Discharge Planning: Goal: Ability to identify and utilize available resources and services will improve Outcome: Progressing Goal: Ability to manage health-related needs will improve Outcome: Progressing   Problem: Metabolic: Goal: Ability to maintain appropriate glucose levels will improve Outcome: Progressing   Problem: Nutritional: Goal: Maintenance of adequate nutrition will improve Outcome: Progressing Goal: Progress toward achieving an optimal weight will improve Outcome: Progressing   Problem: Skin Integrity: Goal: Risk for impaired skin integrity will decrease Outcome: Progressing   Problem: Tissue Perfusion: Goal: Adequacy of tissue perfusion will improve Outcome: Progressing   Problem: Education: Goal: Knowledge of General Education information will improve Description: Including pain rating scale, medication(s)/side effects and non-pharmacologic comfort measures Outcome: Progressing   Problem: Health Behavior/Discharge Planning: Goal: Ability to manage health-related needs will improve Outcome: Progressing   Problem: Clinical Measurements: Goal: Ability to maintain clinical measurements within normal limits will  improve Outcome: Progressing Goal: Will remain free from infection Outcome: Progressing Goal: Diagnostic test results will improve Outcome: Progressing Goal: Respiratory complications will improve Outcome: Progressing Goal: Cardiovascular complication will be avoided Outcome: Progressing   Problem: Activity: Goal: Risk for activity intolerance will decrease Outcome: Progressing   Problem: Nutrition: Goal: Adequate nutrition will be maintained Outcome: Progressing   Problem: Coping: Goal: Level of anxiety will decrease Outcome: Progressing   Problem: Elimination: Goal: Will not experience complications related to bowel motility Outcome: Progressing Goal: Will not experience complications related to urinary retention Outcome: Progressing   Problem: Pain Managment: Goal: General experience of comfort will improve Outcome: Progressing   Problem: Safety: Goal: Ability to remain free from injury will improve Outcome: Progressing   Problem: Skin Integrity: Goal: Risk for impaired skin integrity will decrease Outcome: Progressing   Problem: Education: Goal: Knowledge of General Education information will improve Description: Including pain rating scale, medication(s)/side effects and non-pharmacologic comfort measures Outcome: Progressing   Problem: Health Behavior/Discharge Planning: Goal: Ability to manage health-related needs will improve Outcome: Progressing   Problem: Clinical Measurements: Goal: Ability to maintain clinical measurements within normal limits will improve Outcome: Progressing Goal: Will remain free from infection Outcome: Progressing Goal: Diagnostic test results will improve Outcome: Progressing Goal: Respiratory complications will improve Outcome: Progressing Goal: Cardiovascular complication will be avoided Outcome: Progressing   Problem: Activity: Goal: Risk for activity intolerance will decrease Outcome: Progressing   Problem:  Nutrition: Goal: Adequate nutrition will be maintained Outcome: Progressing   Problem: Coping: Goal: Level of anxiety will decrease Outcome: Progressing   Problem: Elimination: Goal: Will not experience complications related to bowel motility Outcome: Progressing Goal: Will not experience complications related to urinary retention Outcome: Progressing   Problem: Pain Managment: Goal: General experience of comfort will improve Outcome: Progressing   Problem: Safety: Goal: Ability to remain free from injury will improve Outcome: Progressing   Problem: Skin Integrity: Goal: Risk for impaired skin integrity  will decrease Outcome: Progressing

## 2022-01-31 LAB — TYPE AND SCREEN
ABO/RH(D): A POS
Antibody Screen: NEGATIVE
Unit division: 0
Unit division: 0
Unit division: 0
Unit division: 0

## 2022-01-31 LAB — BPAM RBC
Blood Product Expiration Date: 202307062359
Blood Product Expiration Date: 202307062359
Blood Product Expiration Date: 202307092359
Blood Product Expiration Date: 202307092359
ISSUE DATE / TIME: 202306121939
ISSUE DATE / TIME: 202306122316
ISSUE DATE / TIME: 202306131157
ISSUE DATE / TIME: 202306151033
Unit Type and Rh: 6200
Unit Type and Rh: 6200
Unit Type and Rh: 6200
Unit Type and Rh: 6200

## 2022-02-02 LAB — CULTURE, BODY FLUID W GRAM STAIN -BOTTLE
Culture: NO GROWTH
Special Requests: ADEQUATE

## 2022-02-03 ENCOUNTER — Encounter (HOSPITAL_COMMUNITY): Payer: 59

## 2022-02-03 ENCOUNTER — Telehealth (HOSPITAL_COMMUNITY): Payer: Self-pay

## 2022-02-03 NOTE — Telephone Encounter (Signed)
She just got out of the hospital and wants to take a break for this date 02/03/22.

## 2022-02-04 ENCOUNTER — Other Ambulatory Visit: Payer: Self-pay

## 2022-02-04 ENCOUNTER — Ambulatory Visit
Admission: RE | Admit: 2022-02-04 | Discharge: 2022-02-04 | Disposition: A | Discharge status: Home / Self Care | Payer: 59 | Source: Ambulatory Visit | Attending: Gastroenterology | Admitting: Gastroenterology

## 2022-02-04 ENCOUNTER — Inpatient Hospital Stay
Admission: EM | Admit: 2022-02-04 | Discharge: 2022-02-10 | DRG: 982 | Disposition: A | Discharge status: Home / Self Care | Payer: 59 | Attending: Internal Medicine | Admitting: Internal Medicine

## 2022-02-04 DIAGNOSIS — K7460 Unspecified cirrhosis of liver: Secondary | ICD-10-CM | POA: Diagnosis not present

## 2022-02-04 DIAGNOSIS — D649 Anemia, unspecified: Secondary | ICD-10-CM

## 2022-02-04 DIAGNOSIS — K7581 Nonalcoholic steatohepatitis (NASH): Secondary | ICD-10-CM | POA: Insufficient documentation

## 2022-02-04 DIAGNOSIS — D61818 Other pancytopenia: Secondary | ICD-10-CM | POA: Diagnosis not present

## 2022-02-04 DIAGNOSIS — K648 Other hemorrhoids: Secondary | ICD-10-CM | POA: Diagnosis not present

## 2022-02-04 DIAGNOSIS — I129 Hypertensive chronic kidney disease with stage 1 through stage 4 chronic kidney disease, or unspecified chronic kidney disease: Secondary | ICD-10-CM | POA: Diagnosis not present

## 2022-02-04 DIAGNOSIS — E1140 Type 2 diabetes mellitus with diabetic neuropathy, unspecified: Secondary | ICD-10-CM | POA: Diagnosis not present

## 2022-02-04 DIAGNOSIS — F32A Depression, unspecified: Secondary | ICD-10-CM | POA: Diagnosis present

## 2022-02-04 DIAGNOSIS — Z79890 Hormone replacement therapy (postmenopausal): Secondary | ICD-10-CM

## 2022-02-04 DIAGNOSIS — K625 Hemorrhage of anus and rectum: Secondary | ICD-10-CM | POA: Diagnosis not present

## 2022-02-04 DIAGNOSIS — N1832 Chronic kidney disease, stage 3b: Secondary | ICD-10-CM | POA: Diagnosis present

## 2022-02-04 DIAGNOSIS — Z8249 Family history of ischemic heart disease and other diseases of the circulatory system: Secondary | ICD-10-CM

## 2022-02-04 DIAGNOSIS — K766 Portal hypertension: Secondary | ICD-10-CM | POA: Diagnosis present

## 2022-02-04 DIAGNOSIS — M1990 Unspecified osteoarthritis, unspecified site: Secondary | ICD-10-CM | POA: Diagnosis not present

## 2022-02-04 DIAGNOSIS — K922 Gastrointestinal hemorrhage, unspecified: Secondary | ICD-10-CM | POA: Diagnosis not present

## 2022-02-04 DIAGNOSIS — R059 Cough, unspecified: Secondary | ICD-10-CM | POA: Diagnosis not present

## 2022-02-04 DIAGNOSIS — R188 Other ascites: Secondary | ICD-10-CM | POA: Diagnosis present

## 2022-02-04 DIAGNOSIS — F418 Other specified anxiety disorders: Secondary | ICD-10-CM | POA: Diagnosis present

## 2022-02-04 DIAGNOSIS — N189 Chronic kidney disease, unspecified: Secondary | ICD-10-CM | POA: Diagnosis not present

## 2022-02-04 DIAGNOSIS — D631 Anemia in chronic kidney disease: Secondary | ICD-10-CM

## 2022-02-04 DIAGNOSIS — Z7682 Awaiting organ transplant status: Secondary | ICD-10-CM

## 2022-02-04 DIAGNOSIS — K643 Fourth degree hemorrhoids: Secondary | ICD-10-CM | POA: Diagnosis not present

## 2022-02-04 DIAGNOSIS — R509 Fever, unspecified: Secondary | ICD-10-CM

## 2022-02-04 DIAGNOSIS — Z833 Family history of diabetes mellitus: Secondary | ICD-10-CM

## 2022-02-04 DIAGNOSIS — I10 Essential (primary) hypertension: Secondary | ICD-10-CM | POA: Diagnosis present

## 2022-02-04 DIAGNOSIS — Z91040 Latex allergy status: Secondary | ICD-10-CM

## 2022-02-04 DIAGNOSIS — E785 Hyperlipidemia, unspecified: Secondary | ICD-10-CM | POA: Diagnosis not present

## 2022-02-04 DIAGNOSIS — D563 Thalassemia minor: Secondary | ICD-10-CM | POA: Diagnosis not present

## 2022-02-04 DIAGNOSIS — Z79899 Other long term (current) drug therapy: Secondary | ICD-10-CM

## 2022-02-04 DIAGNOSIS — E669 Obesity, unspecified: Secondary | ICD-10-CM | POA: Diagnosis not present

## 2022-02-04 DIAGNOSIS — D62 Acute posthemorrhagic anemia: Secondary | ICD-10-CM | POA: Diagnosis not present

## 2022-02-04 DIAGNOSIS — R5082 Postprocedural fever: Secondary | ICD-10-CM | POA: Diagnosis not present

## 2022-02-04 DIAGNOSIS — Z8701 Personal history of pneumonia (recurrent): Secondary | ICD-10-CM

## 2022-02-04 DIAGNOSIS — K219 Gastro-esophageal reflux disease without esophagitis: Secondary | ICD-10-CM | POA: Diagnosis not present

## 2022-02-04 DIAGNOSIS — E1122 Type 2 diabetes mellitus with diabetic chronic kidney disease: Secondary | ICD-10-CM | POA: Diagnosis not present

## 2022-02-04 DIAGNOSIS — R58 Hemorrhage, not elsewhere classified: Secondary | ICD-10-CM

## 2022-02-04 DIAGNOSIS — G4733 Obstructive sleep apnea (adult) (pediatric): Secondary | ICD-10-CM | POA: Diagnosis not present

## 2022-02-04 DIAGNOSIS — N179 Acute kidney failure, unspecified: Secondary | ICD-10-CM

## 2022-02-04 DIAGNOSIS — Z95828 Presence of other vascular implants and grafts: Secondary | ICD-10-CM | POA: Diagnosis not present

## 2022-02-04 DIAGNOSIS — K8020 Calculus of gallbladder without cholecystitis without obstruction: Secondary | ICD-10-CM | POA: Diagnosis not present

## 2022-02-04 DIAGNOSIS — Z8616 Personal history of COVID-19: Secondary | ICD-10-CM | POA: Diagnosis not present

## 2022-02-04 DIAGNOSIS — E1165 Type 2 diabetes mellitus with hyperglycemia: Secondary | ICD-10-CM | POA: Diagnosis present

## 2022-02-04 DIAGNOSIS — R69 Illness, unspecified: Secondary | ICD-10-CM | POA: Diagnosis not present

## 2022-02-04 DIAGNOSIS — Z0189 Encounter for other specified special examinations: Secondary | ICD-10-CM | POA: Diagnosis not present

## 2022-02-04 DIAGNOSIS — E1129 Type 2 diabetes mellitus with other diabetic kidney complication: Secondary | ICD-10-CM | POA: Diagnosis present

## 2022-02-04 DIAGNOSIS — F419 Anxiety disorder, unspecified: Secondary | ICD-10-CM | POA: Diagnosis not present

## 2022-02-04 DIAGNOSIS — Z90710 Acquired absence of both cervix and uterus: Secondary | ICD-10-CM

## 2022-02-04 DIAGNOSIS — Z96611 Presence of right artificial shoulder joint: Secondary | ICD-10-CM | POA: Diagnosis present

## 2022-02-04 LAB — COMPREHENSIVE METABOLIC PANEL
ALT: 22 U/L (ref 0–44)
AST: 36 U/L (ref 15–41)
Albumin: 2.5 g/dL — ABNORMAL LOW (ref 3.5–5.0)
Alkaline Phosphatase: 85 U/L (ref 38–126)
Anion gap: 10 (ref 5–15)
BUN: 38 mg/dL — ABNORMAL HIGH (ref 8–23)
CO2: 17 mmol/L — ABNORMAL LOW (ref 22–32)
Calcium: 8 mg/dL — ABNORMAL LOW (ref 8.9–10.3)
Chloride: 108 mmol/L (ref 98–111)
Creatinine, Ser: 1.76 mg/dL — ABNORMAL HIGH (ref 0.44–1.00)
GFR, Estimated: 32 mL/min — ABNORMAL LOW (ref >60)
Glucose, Bld: 264 mg/dL — ABNORMAL HIGH (ref 70–99)
Potassium: 3.7 mmol/L (ref 3.5–5.1)
Sodium: 135 mmol/L (ref 135–145)
Total Bilirubin: 1.5 mg/dL — ABNORMAL HIGH (ref 0.3–1.2)
Total Protein: 4.3 g/dL — ABNORMAL LOW (ref 6.5–8.1)

## 2022-02-04 LAB — CBC
HCT: 17.4 % — ABNORMAL LOW (ref 36.0–46.0)
Hemoglobin: 5.4 g/dL — ABNORMAL LOW (ref 12.0–15.0)
MCH: 23.1 pg — ABNORMAL LOW (ref 26.0–34.0)
MCHC: 31 g/dL (ref 30.0–36.0)
MCV: 74.4 fL — ABNORMAL LOW (ref 80.0–100.0)
Platelets: 103 10*3/uL — ABNORMAL LOW (ref 150–400)
RBC: 2.34 MIL/uL — ABNORMAL LOW (ref 3.87–5.11)
RDW: 24.8 % — ABNORMAL HIGH (ref 11.5–15.5)
WBC: 4 10*3/uL (ref 4.0–10.5)
nRBC: 1.2 % — ABNORMAL HIGH (ref 0.0–0.2)

## 2022-02-04 LAB — CBC WITH DIFFERENTIAL/PLATELET
Abs Immature Granulocytes: 0.02 10*3/uL (ref 0.00–0.07)
Basophils Absolute: 0 10*3/uL (ref 0.0–0.1)
Basophils Relative: 0 %
Eosinophils Absolute: 0.1 10*3/uL (ref 0.0–0.5)
Eosinophils Relative: 2 %
HCT: 15 % — ABNORMAL LOW (ref 36.0–46.0)
Hemoglobin: 4.7 g/dL — CL (ref 12.0–15.0)
Immature Granulocytes: 0 %
Lymphocytes Relative: 17 %
Lymphs Abs: 0.9 10*3/uL (ref 0.7–4.0)
MCH: 22.1 pg — ABNORMAL LOW (ref 26.0–34.0)
MCHC: 31.3 g/dL (ref 30.0–36.0)
MCV: 70.4 fL — ABNORMAL LOW (ref 80.0–100.0)
Monocytes Absolute: 0.5 10*3/uL (ref 0.1–1.0)
Monocytes Relative: 9 %
Neutro Abs: 3.5 10*3/uL (ref 1.7–7.7)
Neutrophils Relative %: 72 %
Platelets: 139 10*3/uL — ABNORMAL LOW (ref 150–400)
RBC: 2.13 MIL/uL — ABNORMAL LOW (ref 3.87–5.11)
RDW: 26.1 % — ABNORMAL HIGH (ref 11.5–15.5)
Smear Review: NORMAL
WBC: 5 10*3/uL (ref 4.0–10.5)
nRBC: 0.4 % — ABNORMAL HIGH (ref 0.0–0.2)

## 2022-02-04 LAB — GLUCOSE, CAPILLARY
Glucose-Capillary: 127 mg/dL — ABNORMAL HIGH (ref 70–99)
Glucose-Capillary: 139 mg/dL — ABNORMAL HIGH (ref 70–99)
Glucose-Capillary: 156 mg/dL — ABNORMAL HIGH (ref 70–99)
Glucose-Capillary: 115 mg/dL — ABNORMAL HIGH (ref 70–99)

## 2022-02-04 LAB — RETICULOCYTES
Immature Retic Fract: 10.5 % (ref 2.3–15.9)
RBC.: 2.16 MIL/uL — ABNORMAL LOW (ref 3.87–5.11)
Retic Count, Absolute: 121.2 10*3/uL (ref 19.0–186.0)
Retic Ct Pct: 5.6 % — ABNORMAL HIGH (ref 0.4–3.1)

## 2022-02-04 LAB — PROTIME-INR
INR: 1.3 — ABNORMAL HIGH (ref 0.8–1.2)
Prothrombin Time: 15.6 seconds — ABNORMAL HIGH (ref 11.4–15.2)

## 2022-02-04 LAB — LIPASE, BLOOD: Lipase: 52 U/L — ABNORMAL HIGH (ref 11–51)

## 2022-02-04 LAB — PREPARE RBC (CROSSMATCH)

## 2022-02-04 LAB — APTT: aPTT: 31 seconds (ref 24–36)

## 2022-02-04 MED ORDER — HYDROCORTISONE ACETATE 25 MG RE SUPP
25.0000 mg | Freq: Two times a day (BID) | RECTAL | Status: DC
  Administered 2022-02-04 – 2022-02-09 (×5): 25 mg via RECTAL
  Filled 2022-02-04 (×13): qty 1

## 2022-02-04 MED ORDER — ALBUMIN HUMAN 25 % IV SOLN
INTRAVENOUS | Status: AC
  Administered 2022-02-04: 25 g via INTRAVENOUS
  Filled 2022-02-04: qty 100

## 2022-02-04 MED ORDER — SODIUM CHLORIDE 0.9 % IV SOLN
10.0000 mL/h | Freq: Once | INTRAVENOUS | Status: AC
  Administered 2022-02-04: 10 mL/h via INTRAVENOUS

## 2022-02-04 MED ORDER — VITAMIN D 25 MCG (1000 UNIT) PO TABS
2000.0000 [IU] | ORAL_TABLET | Freq: Every day | ORAL | Status: DC
  Administered 2022-02-05 – 2022-02-09 (×4): 2000 [IU] via ORAL
  Filled 2022-02-04 (×4): qty 2

## 2022-02-04 MED ORDER — ATORVASTATIN CALCIUM 10 MG PO TABS
10.0000 mg | ORAL_TABLET | Freq: Every day | ORAL | Status: DC
  Administered 2022-02-05 – 2022-02-10 (×6): 10 mg via ORAL
  Filled 2022-02-04 (×6): qty 1

## 2022-02-04 MED ORDER — ACETAMINOPHEN 325 MG PO TABS
650.0000 mg | ORAL_TABLET | Freq: Four times a day (QID) | ORAL | Status: DC | PRN
  Administered 2022-02-07 – 2022-02-09 (×4): 650 mg via ORAL
  Filled 2022-02-04 (×5): qty 2

## 2022-02-04 MED ORDER — PANTOPRAZOLE SODIUM 40 MG IV SOLR
40.0000 mg | Freq: Two times a day (BID) | INTRAVENOUS | Status: DC
  Administered 2022-02-04 – 2022-02-05 (×2): 40 mg via INTRAVENOUS
  Filled 2022-02-04 (×6): qty 10

## 2022-02-04 MED ORDER — GABAPENTIN 400 MG PO CAPS
800.0000 mg | ORAL_CAPSULE | Freq: Every day | ORAL | Status: DC
  Administered 2022-02-05 – 2022-02-09 (×5): 800 mg via ORAL
  Filled 2022-02-04 (×5): qty 2

## 2022-02-04 MED ORDER — ADULT MULTIVITAMIN W/MINERALS CH
1.0000 | ORAL_TABLET | Freq: Every day | ORAL | Status: DC
  Administered 2022-02-05 – 2022-02-10 (×6): 1 via ORAL
  Filled 2022-02-04 (×6): qty 1

## 2022-02-04 MED ORDER — HYDRALAZINE HCL 20 MG/ML IJ SOLN: 5.0000 mg | INTRAMUSCULAR | Status: DC | PRN

## 2022-02-04 MED ORDER — ONDANSETRON HCL 4 MG/2ML IJ SOLN: 4.0000 mg | Freq: Three times a day (TID) | INTRAMUSCULAR | Status: DC | PRN

## 2022-02-04 MED ORDER — SERTRALINE HCL 50 MG PO TABS
50.0000 mg | ORAL_TABLET | Freq: Every day | ORAL | Status: DC
  Administered 2022-02-05 – 2022-02-10 (×6): 50 mg via ORAL
  Filled 2022-02-04 (×6): qty 1

## 2022-02-04 MED ORDER — ALBUMIN HUMAN 25 % IV SOLN: 25.0000 g | Freq: Once | INTRAVENOUS | Status: AC

## 2022-02-04 MED ORDER — LACTULOSE 10 GM/15ML PO SOLN
20.0000 g | Freq: Every day | ORAL | Status: DC
  Administered 2022-02-05 – 2022-02-09 (×3): 20 g via ORAL
  Filled 2022-02-04 (×4): qty 30

## 2022-02-04 MED ORDER — MELATONIN 5 MG PO TABS
10.0000 mg | ORAL_TABLET | Freq: Every evening | ORAL | Status: DC | PRN
  Administered 2022-02-07 – 2022-02-09 (×3): 10 mg via ORAL
  Filled 2022-02-04 (×3): qty 2

## 2022-02-04 MED ORDER — VITAMIN B-12 1000 MCG PO TABS
1000.0000 ug | ORAL_TABLET | Freq: Every day | ORAL | Status: DC
  Administered 2022-02-05 – 2022-02-10 (×6): 1000 ug via ORAL
  Filled 2022-02-04 (×7): qty 1

## 2022-02-04 NOTE — ED Notes (Signed)
Pt now agrees to take protonix. Will give when blood has finished or another IV is established

## 2022-02-04 NOTE — Assessment & Plan Note (Signed)
Continue to monitor hemoglobin

## 2022-02-04 NOTE — ED Notes (Signed)
Pt complains of pain in right hand where IV is. This RN flushed PT iv with no complications. Pt agreed IV could stay until IV team came. (Iv consult in). Pt refuses meds through IV at this time.

## 2022-02-04 NOTE — Procedures (Signed)
PROCEDURE SUMMARY:  Successful ultrasound guided paracentesis from the RUQ. Yielded 5L of clear yellow fluid. No immediate complications.  The patient tolerated the procedure well.   Specimen was not sent for labs.  EBL <  77m  The [facility], and deemed not a candidate for intervention.   [facility] and is awaiting liver transplant there, [facility]  MHedy Jacob PA-C 02/04/2022, 11:43 AM

## 2022-02-04 NOTE — ED Provider Notes (Signed)
[facility] Provider Note    Event Date/Time   First MD Initiated Contact with Patient 02/04/22 1212     (approximate)   History   Abnormal Labs   HPI  Rachel Huber is a 66 y.o. female  beta thal, non alcoholic cirrhosis gets paras- with weakness for one week.  Went to heme doc yesterday and noted her hemoglobin was 5.2. Therefore they wanted her to come into the ED to be evaluated.  She reports a hemorrhoid that has been bleeding a lot.    On review on 6/12 of hospital she had 3 units of blood. The bleeding hemorrhoid stopped but then it restarted.    Physical Exam   Triage Vital Signs: ED Triage Vitals  Enc Vitals Group     BP 02/04/22 1154 (!) 109/40     Pulse Rate 02/04/22 1154 91     Resp 02/04/22 1154 19     Temp 02/04/22 1154 98 F (36.7 C)     Temp Source 02/04/22 1154 Oral     SpO2 02/04/22 1154 99 %     Weight 02/04/22 1158 259 lb 14.8 oz (117.9 kg)     Height 02/04/22 1158 5' 9"  (1.753 m)     Head Circumference --      Peak Flow --      Pain Score 02/04/22 1158 6     Pain Loc --      Pain Edu? --      Excl. in [place]? --     Most recent vital signs: Vitals:   02/04/22 1154  BP: (!) 109/40  Pulse: 91  Resp: 19  Temp: 98 F (36.7 C)  SpO2: 99%     General: Awake, no distress.  CV:  Good peripheral perfusion.  Resp:  Normal effort.  Abd:  No distention.  Soft and nontender Other:  Patient has a little hemorrhoid noted with a little bit of bright red blood on my rectal exam   ED Results / Procedures / Treatments   Labs (all labs ordered are listed, but only abnormal results are displayed) Labs Reviewed  CBC WITH DIFFERENTIAL/PLATELET - Abnormal; Notable for the following components:      Result Value   RBC 2.13 (*)    Hemoglobin 4.7 (*)    HCT 15.0 (*)    MCV 70.4 (*)    MCH 22.1 (*)    RDW 26.1 (*)    Platelets 139 (*)    nRBC 0.4 (*)    All other components within normal limits  COMPREHENSIVE METABOLIC PANEL  - Abnormal; Notable for the following components:   CO2 17 (*)    Glucose, Bld 264 (*)    BUN 38 (*)    Creatinine, Ser 1.76 (*)    Calcium 8.0 (*)    Total Protein 4.3 (*)    Albumin 2.5 (*)    Total Bilirubin 1.5 (*)    GFR, Estimated 32 (*)    All other components within normal limits  LIPASE, BLOOD - Abnormal; Notable for the following components:   Lipase 52 (*)    All other components within normal limits  PROTIME-INR - Abnormal; Notable for the following components:   Prothrombin Time 15.6 (*)    INR 1.3 (*)    All other components within normal limits  APTT  TYPE AND SCREEN  PREPARE RBC (CROSSMATCH)     EKG  My interpretation of EKG:  Initial EKG difficult to read due  to some artifact and difficult to see P waves.  Sinus with occasional PAC but will get a repeat just to ensure  Repeat EKG does look sinus rate of 88 without any ST elevation or T wave inversions does have a little artifact better than previous, QTc slightly prolonged but patient was very low voltage so is really hard to see.   RADIOLOGY   PROCEDURES:  Critical Care performed: Yes, see critical care procedure note(s)  .Critical Care  Performed by: Vanessa Oxford, MD Authorized by: Vanessa Shannon, MD   Critical care provider statement:    Critical care time (minutes):  30   Critical care was necessary to treat or prevent imminent or life-threatening deterioration of the following conditions: anemia.   Critical care was time spent personally by me on the following activities:  Development of treatment plan with patient or surrogate, discussions with consultants, evaluation of patient's response to treatment, examination of patient, ordering and review of laboratory studies, ordering and review of radiographic studies, ordering and performing treatments and interventions, pulse oximetry, re-evaluation of patient's condition and review of old charts .1-3 Lead EKG Interpretation  Performed by: Vanessa Plover, MD Authorized by: Vanessa Deaf Smith, MD     Interpretation: normal     ECG rate:  90   ECG rate assessment: normal     Rhythm: sinus rhythm     Ectopy: none     Conduction: normal      MEDICATIONS ORDERED IN ED: Medications  0.9 %  sodium chloride infusion (has no administration in time range)     IMPRESSION / MDM / ASSESSMENT AND PLAN / ED COURSE  I reviewed the triage vital signs and the nursing notes.   Patient's presentation is most consistent with acute presentation with potential threat to life or bodily function.   This is most concerning for GI bleed most likely secondary to her known hemorrhoid.  Labs were ordered to evaluate for anemia.  The low blood levels prior to the para-She denies having any abdominal pain so unlikely to be an internal bleed in her abdomen.  CBC confirms low hemoglobin of 4.7.  CMP shows stable elevated creatinine.  Lipase is slightly elevated.  INR 1.3 but patient has known liver disease.  I discussed the case with Dr. Dahlia Byes given recurrent hemorrhoids causing bleeding and they will come and see patient we will keep patient n.p.o. for now  I have consented patient for blood and will transfuse 3 units.  We will discuss the hospital team for admission  The patient is on the cardiac monitor to evaluate for evidence of arrhythmia and/or significant heart rate changes.      FINAL CLINICAL IMPRESSION(S) / ED DIAGNOSES   Final diagnoses:  Cirrhosis of liver with ascites, unspecified hepatic cirrhosis type ([place])  Symptomatic anemia  Gastrointestinal hemorrhage, unspecified gastrointestinal hemorrhage type     Rx / DC Orders   ED Discharge Orders     None        Note:  This document was prepared using Dragon voice recognition software and may include unintentional dictation errors.   Vanessa Onton, MD 02/04/22 1425

## 2022-02-04 NOTE — ED Triage Notes (Signed)
Pt here with abnormal labs, hgb 5.0. Pt was here for a paracentesis this morning. Pt states she only has pain when she foes to the bathroom, pt is bleeding from her hemorrhoids. Pt also has a headache.

## 2022-02-04 NOTE — H&P (Signed)
History and Physical    Rachel Huber SEG:[id number] DOB: 1956-01-09 DOA: 02/04/2022  Referring MD/NP/PA:   PCP: [name], MD   Patient coming from:  The patient is coming from home.  At baseline, pt is independent for most of ADL.        Chief Complaint: rectal bleeding  HPI: Rachel Huber is a 66 y.o. 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  Patient was initially here for paracentesis with 5 L fluid removed today.  Patient received albumin. Patient states that in the past 4 days, she has been having intermittent rectal bleeding with bright red blood.  She believes that this is from hemorrhoidal bleeding.  Patient denies nausea, vomiting, diarrhea or abdominal pain.  Denies chest pain or shortness breath.  No cough, fever or chills.  Patient has generalized weakness.  Of note, patient was recently hospitalized from 6/12 - 6/15 due to rectal bleeding, which was thought likely due to hemorrhoidal bleeding.  Patient was transfused with blood, and her bleeding stopped.    Data Reviewed and ED Course: pt was found to have hemoglobin 4.7 (7.0 on 01/31/2012), WBC 5.0, INR 1.3, PTT 31, renal function close to baseline, temperature normal, blood pressure 93/50, heart rate 98, RR 17, oxygen saturation 100% on room air.  Patient is admitted to PCU as inpatient.  Dr. Dahlia Byes of general surgery was consulted.  EKG: I have personally reviewed.  Sinus rhythm, QTc 470, PAC, low voltage   Review of Systems:   General: no fevers, chills, no body weight gain, has fatigue HEENT: no blurry vision, hearing changes or sore throat Respiratory: no dyspnea, coughing, wheezing CV: no chest pain, no palpitations GI:  no nausea, vomiting, abdominal pain, diarrhea, constipation.  Has abdominal distention and  rectal bleeding GU: no dysuria, burning on urination, increased urinary frequency, hematuria Ext: has leg edema Neuro: no unilateral weakness, numbness, or tingling, no vision change or hearing loss Skin: no rash, no skin tear. MSK: No muscle spasm, no deformity, no limitation of range of movement in spin Heme: No easy bruising.  Travel history: No recent long distant travel.   Allergy:  Allergies  Allergen Reactions   Latex Rash    Contact rash    Past Medical History:  Diagnosis Date   Anxiety    Arthritis    Cirrhosis of liver ([place])    Diabetes mellitus without complication (HCC)    Dyspnea    DOE   GERD (gastroesophageal reflux disease)    Hepatitis    PAST   Hypertension    Neuropathy    Neuropathy, diabetic ([place])    Pneumonia    Sinus complaint    Sleep apnea    CPAP   Thalassemia minor 1992    Past Surgical History:  Procedure Laterality Date   ABDOMINAL HYSTERECTOMY     BREAST BIOPSY Right 02/15/2018   [place] bx 6-6:30 ribbon shape, ONE CORE FRAGMENT WITH FIBROSIS. ONE CORE FRAGMENT WITH PORTION OF A DILATED   BREAST BIOPSY Right 02/15/2018   [place] bx 9:00 heart shape, USUAL DUCTAL HYPERPLASIA   BREAST LUMPECTOMY Right 03/09/2018   Procedure: BREAST LUMPECTOMY x 2;  Surgeon: Benjamine Sprague, DO;  Location: [facility] ORS;  Service: General;  Laterality: Right;  CATARACT EXTRACTION W/PHACO Left 06/11/2016   Procedure: CATARACT EXTRACTION PHACO AND INTRAOCULAR LENS PLACEMENT (IOC);  Surgeon: Estill Cotta, MD;  Location: [facility] ORS;  Service: Ophthalmology;  Laterality: Left;  Lot # [id number] H US:01:38.6 AP%:26.4 CDE:44.15   CATARACT EXTRACTION W/PHACO Right 06/15/2018   Procedure: CATARACT EXTRACTION PHACO AND INTRAOCULAR LENS PLACEMENT (IOC);  Surgeon: Birder Robson, MD;  Location: [facility] ORS;  Service: Ophthalmology;  Laterality: Right;  [place] 00:38.2 CDE 4.23 Fluid Pack Lot #  [id number]   COLONOSCOPIES     COLONOSCOPY WITH PROPOFOL N/A 10/10/2020   Procedure: COLONOSCOPY WITH PROPOFOL;  Surgeon: Lesly Rubenstein, MD;  Location: [facility] ENDOSCOPY;  Service: Endoscopy;  Laterality: N/A;   COLONOSCOPY WITH PROPOFOL N/A 11/20/2020   Procedure: COLONOSCOPY WITH PROPOFOL;  Surgeon: Lesly Rubenstein, MD;  Location: [facility] ENDOSCOPY;  Service: Endoscopy;  Laterality: N/A;  DM STAT CBC, BMP COVID POSITIVE 09/02/2020   CTR     ESOPHAGOGASTRODUODENOSCOPY (EGD) WITH PROPOFOL N/A 10/10/2020   Procedure: ESOPHAGOGASTRODUODENOSCOPY (EGD) WITH PROPOFOL;  Surgeon: Lesly Rubenstein, MD;  Location: [facility] ENDOSCOPY;  Service: Endoscopy;  Laterality: N/A;  COVID POSITIVE 10/08/2020   IR PARACENTESIS  12/17/2021   JOINT REPLACEMENT     KNEE SURGERY Right 09/24/2017   plates and pins   TOTAL SHOULDER ARTHROPLASTY Right 09/27/2015    Social History:  reports that she has never smoked. She has never used smokeless tobacco. She reports that she does not drink alcohol and does not use drugs.  Family History:  Family History  Problem Relation Age of Onset   Breast cancer Mother 43   Lymphoma Mother    Diabetes Father    Kidney cancer Father    Heart disease Father    Diabetes Sister    Breast cancer Sister 55     Prior to Admission medications   Medication Sig Start Date End Date Taking? Authorizing Provider  acetaminophen (TYLENOL) 650 MG CR tablet Take 1,300 mg by mouth daily as needed for pain.    [provider]  atorvastatin (LIPITOR) 10 MG tablet Take 10 mg by mouth daily. 01/28/21   [provider]  Cholecalciferol 50 MCG (2000 UT) CAPS Take by mouth.    [provider]  diphenhydrAMINE (BENADRYL) 25 MG tablet Take 25 mg by mouth 2 (two) times daily as needed for itching.    [provider]  gabapentin (NEURONTIN) 400 MG capsule Take 800 mg by mouth daily at 2 am. 01/07/18   [provider]  guaiFENesin-dextromethorphan (ROBITUSSIN  DM) 100-10 MG/5ML syrup Take 5 mLs by mouth every 4 (four) hours as needed for cough. 09/13/20   Aline August, MD  hydrocortisone (ANUSOL-HC) 25 MG suppository Place 1 suppository (25 mg total) rectally 2 (two) times daily. 01/30/22   Roxan Hockey, MD  lactulose, encephalopathy, (CHRONULAC) 10 GM/15ML SOLN Take 30 mLs (20 g total) by mouth daily. 01/30/22   Roxan Hockey, MD  melatonin 5 MG TABS Take 10 mg by mouth at bedtime as needed (sleep).    [provider]  Multiple Vitamins-Minerals (MULTIVITAMIN WITH MINERALS) tablet Take 1 tablet by mouth daily.    [provider]  ondansetron (ZOFRAN-ODT) 4 MG disintegrating tablet Take 1 tablet (4 mg total) by mouth every 8 (eight) hours as needed for nausea or vomiting. 01/30/22   Roxan Hockey, MD  oxymetazoline (AFRIN) 0.05 % nasal spray Place 1 spray into both nostrils 2 (two) times daily as needed.    [provider]  pantoprazole (PROTONIX) 40 MG  tablet Take 1 tablet (40 mg total) by mouth daily. 01/30/22 01/30/23  Roxan Hockey, MD  sertraline (ZOLOFT) 50 MG tablet Take 50 mg by mouth daily. 12/12/17   [provider]  vitamin B-12 (CYANOCOBALAMIN) 1000 MCG tablet Take 1,000 mcg by mouth daily.    [provider]    Physical Exam: Vitals:   02/04/22 1630 02/04/22 1700 02/04/22 1730 02/04/22 1742  BP: (!) 111/53 (!) 106/48 (!) 103/43 (!) 113/47  Pulse: 84 82 87 85  Resp: 15 15 (!) 22 18  Temp:    97.7 F (36.5 C)  TempSrc:    Oral  SpO2: 100% 100% 100% 100%  Weight:      Height:       General: Not in acute distress. Pale looking HEENT:       Eyes: PERRL, EOMI, no scleral icterus.       ENT: No discharge from the ears and nose, no pharynx injection, no tonsillar enlargement.        Neck: No JVD, no bruit, no mass felt. Heme: No neck lymph node enlargement. Cardiac: S1/S2, RRR, No murmurs, No gallops or rubs. Respiratory: No rales, wheezing, rhonchi or rubs. GI: distended,  nontender, no rebound pain, no organomegaly, BS present. GU: No hematuria Ext:  2+ pitting leg edema bilaterally. 1+DP/PT pulse bilaterally. Musculoskeletal: No joint deformities, No joint redness or warmth, no limitation of ROM in spin. Skin: No rashes.  Neuro: Alert, oriented X3, cranial nerves II-XII grossly intact, moves all extremities normally.  Psych: Patient is not psychotic, no suicidal or hemocidal ideation.  Labs on Admission: I have personally reviewed following labs and imaging studies  CBC: Recent Labs  Lab 01/29/22 0442 01/30/22 0512 02/04/22 1245  WBC 3.6* 3.6* 5.0  NEUTROABS  --   --  3.5  HGB 7.1* 7.0* 4.7*  HCT 22.8* 22.6* 15.0*  MCV 69.3* 70.2* 70.4*  PLT 127* 121* 706*   Basic Metabolic Panel: Recent Labs  Lab 01/29/22 0442 02/04/22 1245  NA 136 135  K 5.0 3.7  CL 112* 108  CO2 20* 17*  GLUCOSE 105* 264*  BUN 35* 38*  CREATININE 1.65* 1.76*  CALCIUM 8.1* 8.0*   GFR: Estimated Creatinine Clearance: 44.3 mL/min (A) (by C-G formula based on SCr of 1.76 mg/dL (H)). Liver Function Tests: Recent Labs  Lab 02/04/22 1245  AST 36  ALT 22  ALKPHOS 85  BILITOT 1.5*  PROT 4.3*  ALBUMIN 2.5*   Recent Labs  Lab 02/04/22 1245  LIPASE 52*   No results for input(s): "AMMONIA" in the last 168 hours. Coagulation Profile: Recent Labs  Lab 02/04/22 1245  INR 1.3*   Cardiac Enzymes: No results for input(s): "CKTOTAL", "CKMB", "CKMBINDEX", "TROPONINI" in the last 168 hours. BNP (last 3 results) No results for input(s): "PROBNP" in the last 8760 hours. HbA1C: No results for input(s): "HGBA1C" in the last 72 hours. CBG: Recent Labs  Lab 01/29/22 1059 01/29/22 1629 01/29/22 2041 01/30/22 0837 01/30/22 1118  GLUCAP 107* 115* 152* 133* 131*   Lipid Profile: No results for input(s): "CHOL", "HDL", "LDLCALC", "TRIG", "CHOLHDL", "LDLDIRECT" in the last 72 hours. Thyroid Function Tests: No results for input(s): "TSH", "T4TOTAL", "FREET4",  "T3FREE", "THYROIDAB" in the last 72 hours. Anemia Panel: No results for input(s): "VITAMINB12", "FOLATE", "FERRITIN", "TIBC", "IRON", "RETICCTPCT" in the last 72 hours. Urine analysis:    Component Value Date/Time   COLORURINE YELLOW (A) 10/25/2020 2026   APPEARANCEUR HAZY (A) 10/25/2020 2026   LABSPEC 1.017 10/25/2020  2026   PHURINE 5.0 10/25/2020 2026   GLUCOSEU NEGATIVE 10/25/2020 2026   HGBUR SMALL (A) 10/25/2020 2026   BILIRUBINUR NEGATIVE 10/25/2020 2026   KETONESUR NEGATIVE 10/25/2020 2026   PROTEINUR NEGATIVE 10/25/2020 2026   NITRITE POSITIVE (A) 10/25/2020 2026   LEUKOCYTESUR SMALL (A) 10/25/2020 2026   Sepsis Labs: @LABRCNTIP (procalcitonin:4,lacticidven:4) ) Recent Results (from the past 240 hour(s))  Acid Fast Smear (AFB)     Status: None   Collection Time: 01/28/22  1:00 PM   Specimen: Peritoneal Cavity; Body Fluid  Result Value Ref Range Status   AFB Specimen Processing Concentration  Final   Acid Fast Smear Negative  Final    Comment: (NOTE) Performed At: [facility] [place], [place] [id number] Rush Farmer MD KA:[id number]    Source (AFB) PERITONEAL  Final    Comment: Performed at [facility], [address] [place], [place] [address]  Culture, body fluid w Gram Stain-bottle     Status: None   Collection Time: 01/28/22  1:00 PM   Specimen: Peritoneal Washings  Result Value Ref Range Status   Specimen Description PERITONEAL  Final   Special Requests   Final    BOTTLES DRAWN AEROBIC AND ANAEROBIC Blood Culture adequate volume   Culture   Final    NO GROWTH 5 DAYS Performed at [facility], [address] [place], [place] [address]    Report Status 02/02/2022 FINAL  Final  Gram stain     Status: None   Collection Time: 01/28/22  1:00 PM   Specimen: Peritoneal Washings  Result Value Ref Range Status   Specimen Description PERITONEAL  Final   Special Requests NONE  Final   Gram Stain   Final    CYTOSPIN SMEAR WBC  PRESENT,BOTH PMN AND MONONUCLEAR NO ORGANISMS SEEN Performed at [facility], [address] [place], [place] [address]    Report Status 01/28/2022 FINAL  Final     Radiological Exams on Admission: US Paracentesis  Result Date: 02/04/2022 INDICATION: Patient with history of NASH cirrhosis with recurrent ascites status post recent TIPS procedure at [facility], maximum volume requested of 8 L to be drawn off per ordering provider. EXAM: ULTRASOUND GUIDED  PARACENTESIS MEDICATIONS: Local 1% lidocaine only. COMPLICATIONS: None immediate. PROCEDURE: Informed written consent was obtained from the patient after a discussion of the risks, benefits and alternatives to treatment. A timeout was performed prior to the initiation of the procedure. Initial ultrasound scanning demonstrates a large amount of ascites within the right upper abdominal quadrant. The right upper abdomen was prepped and draped in the usual sterile fashion. 1% lidocaine was used for local anesthesia. Following this, a 19 gauge, 7-cm, Yueh catheter was introduced. An ultrasound image was saved for documentation purposes. The paracentesis was performed. The catheter was removed and a dressing was applied. The patient tolerated the procedure well without immediate post procedural complication. Vital signs were monitored throughout the entire procedure and remained stable. During the procedure the patient stated that she had recent blood draw earlier this morning at the [facility] clinic and found to have a low hemoglobin and was instructed to go to the emergency department following her paracentesis. The procedure was stopped early secondary to patient's recent lab work revealing low hemoglobin, the patient was transferred to the emergency department as previously scheduled following the procedure for blood transfusion. Additional paracentesis can be performed after blood transfusion if clinically indicated. Patient received post-procedure intravenous  albumin; see nursing notes for details. FINDINGS: A total  of approximately 5 L of clear yellow fluid was removed. IMPRESSION: Successful ultrasound-guided paracentesis yielding 5 liters of peritoneal fluid. PLAN: The patient has reportedly had a TIPS procedure at [facility] on 12/26/2021 and is awaiting liver transplant there, she is followed by the liver clinic at [facility]. This exam was performed by Tsosie Billing PA-C, and was supervised and interpreted by Dr. Serafina Royals. Electronically Signed   By: Ruthann Cancer M.D.   On: 02/04/2022 12:09      Assessment/Plan Principal Problem:   GI bleeding Active Problems:   Acute blood loss anemia   Symptomatic anemia   HLD (hyperlipidemia)   Hypertension   Cirrhosis of liver (HCC)   Chronic kidney disease, stage 3b (HCC)   Thalassemia minor   Type II diabetes mellitus with renal manifestations ([place])   Depression with anxiety   Assessment and Plan: * GI bleeding GI bleeding, acute blood loss anemia, symptomatic anemia: Hemoglobin dropped from 7.0 on 01/30/22 to 4.7.  Possibly due to hemorrhoidal bleeding, though cannot completely rule out upper or lower GI bleeding.  Consulted Dr. Dahlia Byes of general surgery  -Admitted to PCU as inpatient - transfuse 3 units of blood now - Start IV pantoprazole 40 mg bid - Zofran IV for nausea - Avoid NSAIDs and SQ heparin - Maintain IV access (2 large bore IVs if possible). - Monitor closely and follow q6h cbc, transfuse as necessary, if Hgb<7.0 - LaB: INR, PTT and type screen -anemia panel    Acute blood loss anemia -see above  Symptomatic anemia -see above  HLD (hyperlipidemia) - Lipitor  Hypertension - Blood pressure soft at 93/50 -Hold blood pressure medications -IV hydralazine as needed  Cirrhosis of liver (HCC) Mental status normal.  INR 1.3, PTT 31 -Check ammonia level -Continue home lactulose  Chronic kidney disease, stage 3b ([place]) Close to baseline.  Recent baseline creatinine  1.65 on 01/29/2022.  Her creatinine is 1.76, BUN 38. -Follow-up renal function with BMP  Type II diabetes mellitus with renal manifestations (HCC) Recent A1c 5.7.  Diet controlled.   -Check CBG every morning  Thalassemia minor - Transfused 3 unit of blood as above  Depression with anxiety - Continue home medications             DVT ppx: SCD  Code Status: Full code  Family Communication: not done, no family member is at bed side.     Disposition Plan:  Anticipate discharge back to previous environment  Consults called:  Dr. Dahlia Byes of surgery  Admission status and Level of care: Progressive:   as inpt       Severity of Illness:  The appropriate patient status for this patient is INPATIENT. Inpatient status is judged to be reasonable and necessary in order to provide the required intensity of service to ensure the patient's safety. The patient's presenting symptoms, physical exam findings, and initial radiographic and laboratory data in the context of their chronic comorbidities is felt to place them at high risk for further clinical deterioration. Furthermore, it is not anticipated that the patient will be medically stable for discharge from the hospital within 2 midnights of admission.   * I certify that at the point of admission it is my clinical judgment that the patient will require inpatient hospital care spanning beyond 2 midnights from the point of admission due to high intensity of service, high risk for further deterioration and high frequency of surveillance required.*       Date of Service 02/04/2022  Ivor Costa [facility]   If 7PM-7AM, please contact night-coverage [email or web address] 02/04/2022, 6:37 PM

## 2022-02-04 NOTE — Assessment & Plan Note (Addendum)
-  Continue Lipitor °

## 2022-02-04 NOTE — Assessment & Plan Note (Addendum)
Recent TIPS procedure.  Ammonia level 41.  On lactulose daily.  History of ascites with recent paracentesis.  May need to get another paracentesis on Monday

## 2022-02-04 NOTE — Assessment & Plan Note (Signed)
Continue Zoloft 

## 2022-02-04 NOTE — Assessment & Plan Note (Signed)
-   Blood pressure soft at 93/50 -Hold blood pressure medications -IV hydralazine as needed

## 2022-02-04 NOTE — ED Notes (Signed)
Pt was here for blood work and a paracentesis. Pt has bleeding hemorrhoids that they are unable to operate on. Pt has gotten blood 1 week ago and was DC due to hgb levels going up and no bleeding. Pt said she bled all weekend and thought her hemoglobin would be low today.

## 2022-02-04 NOTE — ED Triage Notes (Signed)
First Nurse Note:  Pt via POV from home. Pt here from [facility]. Pt here because her hgb was 5.0. States they took her blood work this morning. Pt currently has a albumin infusion going at this time. Pt is A&Ox4 and NAD

## 2022-02-04 NOTE — Assessment & Plan Note (Addendum)
Creatinine 1.41

## 2022-02-04 NOTE — Consult Note (Signed)
Patient ID: Rachel Huber, female   DOB: [birth year], 66 y.o.   MRN: [id number]  HPI Rachel Huber is a 66 y.o. female in consultation at the request of Dr. Jari Pigg.  Rachel Huber has a complex medical history. 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  Rachel Huber does have significant ascites and more recently paracentesis yielded 7.1 L.  Rachel Huber is Rachel Huber.  Rachel Huber has been evaluated at [facility] for transplant.  Rachel Huber also has seen hepatology at [facility] and Dr. Haig Prophet here with [facility] GI. Rachel Huber reports hematochezia for the last few days and it is intermittent.  Rachel Huber also reports anorectal discomfort and pain that is moderate in severity.  Her hemoglobin went down to 4.7 and is currently being transfused.  Rachel Huber does have a history of chronic anemia and thalassemia.  Most recent hospitalization was in any pain few weeks ago and Rachel Huber required hospitalization with transfusion.  Rachel Huber was also seen by GI and at that time they held any endoscopic evaluation. Creat 1.76, alb 2.5 TB 1.5  HPI  Past Medical History:  Diagnosis Date   Anxiety    Arthritis    Cirrhosis of liver ([place])    Diabetes mellitus without complication (HCC)    Dyspnea    DOE   GERD (gastroesophageal reflux disease)    Hepatitis    PAST   Hypertension    Neuropathy    Neuropathy, diabetic (HCC)    Pneumonia    Sinus complaint    Sleep apnea    CPAP   Thalassemia minor 1992    Past Surgical History:  Procedure Laterality Date   ABDOMINAL HYSTERECTOMY     BREAST BIOPSY Right 02/15/2018   [place] bx 6-6:30 ribbon shape, ONE CORE FRAGMENT WITH FIBROSIS. ONE CORE FRAGMENT WITH PORTION OF A DILATED   BREAST BIOPSY Right 02/15/2018   [place] bx 9:00 heart shape, USUAL DUCTAL HYPERPLASIA   BREAST LUMPECTOMY Right 03/09/2018   Procedure: BREAST  LUMPECTOMY x 2;  Surgeon: Benjamine Sprague, DO;  Location: [facility] ORS;  Service: General;  Laterality: Right;   CATARACT EXTRACTION W/PHACO Left 06/11/2016   Procedure: CATARACT EXTRACTION PHACO AND INTRAOCULAR LENS PLACEMENT (IOC);  Surgeon: Estill Cotta, MD;  Location: [facility] ORS;  Service: Ophthalmology;  Laterality: Left;  Lot # [id number] H US:01:38.6 AP%:26.4 CDE:44.15   CATARACT EXTRACTION W/PHACO Right 06/15/2018   Procedure: CATARACT EXTRACTION PHACO AND INTRAOCULAR LENS PLACEMENT (IOC);  Surgeon: Birder Robson, MD;  Location: [facility] ORS;  Service: Ophthalmology;  Laterality: Right;  [place] 00:38.2 CDE 4.23 Fluid Pack Lot # [id number]   COLONOSCOPIES     COLONOSCOPY WITH PROPOFOL N/A 10/10/2020   Procedure: COLONOSCOPY WITH PROPOFOL;  Surgeon: Lesly Rubenstein, MD;  Location: [facility] ENDOSCOPY;  Service: Endoscopy;  Laterality: N/A;   COLONOSCOPY WITH PROPOFOL N/A 11/20/2020   Procedure: COLONOSCOPY WITH PROPOFOL;  Surgeon: Lesly Rubenstein, MD;  Location: [facility] ENDOSCOPY;  Service: Endoscopy;  Laterality: N/A;  DM STAT CBC, BMP COVID POSITIVE 09/02/2020   CTR     ESOPHAGOGASTRODUODENOSCOPY (EGD) WITH PROPOFOL N/A 10/10/2020   Procedure: ESOPHAGOGASTRODUODENOSCOPY (EGD) WITH PROPOFOL;  Surgeon: Lesly Rubenstein, MD;  Location: [facility] ENDOSCOPY;  Service: Endoscopy;  Laterality: N/A;  COVID POSITIVE 10/08/2020   IR PARACENTESIS  12/17/2021   JOINT REPLACEMENT     KNEE SURGERY Right 09/24/2017  plates and pins   TOTAL SHOULDER ARTHROPLASTY Right 09/27/2015    Family History  Problem Relation Age of Onset   Breast cancer Mother 85   Lymphoma Mother    Diabetes Father    Kidney cancer Father    Heart disease Father    Diabetes Sister    Breast cancer Sister 67    Social History Social History   Tobacco Use   Smoking status: Never   Smokeless tobacco: Never  Vaping Use   Vaping Use: Never used  Substance Use Topics   Alcohol use: No   Drug use: Never    Allergies  Allergen  Reactions   Latex Rash    Contact rash    Current Facility-Administered Medications  Medication Dose Route Frequency Provider Last Rate Last Admin   acetaminophen (TYLENOL) tablet 650 mg  650 mg Oral Q6H PRN Ivor Costa, MD       [START ON 02/05/2022] atorvastatin (LIPITOR) tablet 10 mg  10 mg Oral Daily Ivor Costa, MD       [START ON 02/05/2022] cholecalciferol (VITAMIN D3) tablet 2,000 Units  2,000 Units Oral Q1400 Ivor Costa, MD       [START ON 02/05/2022] gabapentin (NEURONTIN) capsule 800 mg  800 mg Oral Q0200 Ivor Costa, MD       hydrALAZINE (APRESOLINE) injection 5 mg  5 mg Intravenous Q2H PRN Ivor Costa, MD       hydrocortisone (ANUSOL-HC) suppository 25 mg  25 mg Rectal BID Ivor Costa, MD   25 mg at 02/04/22 1618   [START ON 02/05/2022] lactulose (CHRONULAC) 10 GM/15ML solution 20 g  20 g Oral Daily Ivor Costa, MD       melatonin tablet 10 mg  10 mg Oral QHS PRN Ivor Costa, MD       Derrill Memo ON 02/05/2022] multivitamin with minerals tablet 1 tablet  1 tablet Oral Daily Ivor Costa, MD       ondansetron [facility]) injection 4 mg  4 mg Intravenous Q8H PRN Ivor Costa, MD       pantoprazole (PROTONIX) injection 40 mg  40 mg Intravenous Huntley Dec, MD   40 mg at 02/04/22 1752   [START ON 02/05/2022] sertraline (ZOLOFT) tablet 50 mg  50 mg Oral Daily Ivor Costa, MD       Derrill Memo ON 02/05/2022] vitamin B-12 (CYANOCOBALAMIN) tablet 1,000 mcg  1,000 mcg Oral Daily Ivor Costa, MD       Current Outpatient Medications  Medication Sig Dispense Refill   atorvastatin (LIPITOR) 10 MG tablet Take 10 mg by mouth daily.     Cholecalciferol 50 MCG (2000 UT) CAPS Take by mouth.     diphenhydrAMINE (BENADRYL) 25 MG tablet Take 25 mg by mouth 2 (two) times daily as needed for itching.     gabapentin (NEURONTIN) 400 MG capsule Take 800 mg by mouth daily at 2 am.     hydrocortisone (ANUSOL-HC) 25 MG suppository Place 1 suppository (25 mg total) rectally 2 (two) times daily. 12 suppository 5   lactulose,  encephalopathy, (CHRONULAC) 10 GM/15ML SOLN Take 30 mLs (20 g total) by mouth daily. 946 mL 1   melatonin 5 MG TABS Take 10 mg by mouth at bedtime as needed (sleep).     Multiple Vitamins-Minerals (MULTIVITAMIN WITH MINERALS) tablet Take 1 tablet by mouth daily.     pantoprazole (PROTONIX) 40 MG tablet Take 1 tablet (40 mg total) by mouth daily. 30 tablet 3   sertraline (ZOLOFT) 50 MG tablet Take  50 mg by mouth daily.     vitamin B-12 (CYANOCOBALAMIN) 1000 MCG tablet Take 1,000 mcg by mouth daily.     acetaminophen (TYLENOL) 650 MG CR tablet Take 1,300 mg by mouth daily as needed for pain.     guaiFENesin-dextromethorphan (ROBITUSSIN DM) 100-10 MG/5ML syrup Take 5 mLs by mouth every 4 (four) hours as needed for cough. 236 mL 0   ondansetron (ZOFRAN-ODT) 4 MG disintegrating tablet Take 1 tablet (4 mg total) by mouth every 8 (eight) hours as needed for nausea or vomiting. 20 tablet 0   oxymetazoline (AFRIN) 0.05 % nasal spray Place 1 spray into both nostrils 2 (two) times daily as needed.       Review of Systems Full ROS  was asked and was negative except for the information on the HPI  Physical Exam Blood pressure (!) 113/47, pulse 85, temperature 97.7 F (36.5 C), temperature source Oral, resp. rate 18, height 5' 9"  (1.753 m), weight 117.9 kg, SpO2 100 %. CONSTITUTIONAL: Rachel Huber is debilitated and malnorished EYES: Pupils are equal, round,  Sclera are non-icteric. EARS, NOSE, MOUTH AND THROAT:  The oral mucosa is pink and moist. Hearing is intact to voice. LYMPH NODES:  Lymph nodes in the neck are normal. RESPIRATORY:  Lungs are clear. There is normal respiratory effort, with equal breath sounds bilaterally, and without pathologic use of accessory muscles. CARDIOVASCULAR: Heart is regular without murmurs, gallops, or rubs. GI: The abdomen is  soft, nontender, and nondistended.  There is ascites, no peritonitis RECTAL: Evidence of hemorrhoidal cushions mainly on the left lateral position and  also right posterolateral.  There is no active bleeding.  Rachel Huber is tender but there is no evidence of an abscess.  Exam is limited due to discomfort. MUSCULOSKELETAL: Normal muscle strength and tone. No cyanosis or edema.   SKIN: Turgor is good and there are no pathologic skin lesions or ulcers. NEUROLOGIC: Motor and sensation is grossly normal. Cranial nerves are grossly intact. PSYCH:  Oriented to person, place and time. Affect is normal.  Data Reviewed  I have personally reviewed the patient's imaging, laboratory findings and medical records.    Assessment/Plan 66 year old female with very complex history to include chronic renal disease, thalassemia and Rachel Huber cirrhosis [facility] with ascites and portal hypertension status post TIPS and paracentesis.  Now with recurrent hematochezia.  Rachel Huber does have hemorrhoid on exam but currently is not actively bleeding.  This poses a therapeutic challenge.  Up in her ethmoidectomy given her fragility her cirrhosis will potentially cause significant bleeding issues.  I do think that ideally the best treatment will be to get her prioritize and transplant list if Rachel Huber is actually a candidate still for it.  Rachel Huber may benefit from endoscopic evaluation and potentially hemorrhoidal banding by GI as I think this is less invasive and will lead to less bleeding than an open hemorrhoidectomy.  Currently there is no need for emergent surgical intervention at this time.  We will touch base with GI and will continue to follow. Please note that I spent greater than 75 minutes in this encounter including coordination of her care, placing orders, personally reviewing medical records imaging studies and performing appropriate documentation We will follow  Caroleen Hamman, MD [place] Surgeon 02/04/2022, 5:58 PM

## 2022-02-04 NOTE — Assessment & Plan Note (Addendum)
Source is hemorrhoidal bleeding.  Flexible sigmoidoscopy shows hemorrhoids.  Interventional radiology did an embolization of hemorrhoids on 02/07/2022.

## 2022-02-04 NOTE — Assessment & Plan Note (Signed)
-  see above 

## 2022-02-04 NOTE — Assessment & Plan Note (Addendum)
Received total of 4 units of packed red blood cells.  Received IV iron yesterday.  Hemoglobin 8.1 today

## 2022-02-04 NOTE — Assessment & Plan Note (Addendum)
Recent A1c 5.7.  Diet controlled.

## 2022-02-04 NOTE — ED Notes (Signed)
Report received from Stacy, [place]

## 2022-02-04 NOTE — ED Notes (Signed)
Albumin stopped

## 2022-02-04 NOTE — ED Notes (Signed)
Pt initially refused protonix and states she had no need for it. This RN explained the importance of protonix to pt.

## 2022-02-05 ENCOUNTER — Inpatient Hospital Stay: Payer: 59

## 2022-02-05 ENCOUNTER — Encounter: Payer: Self-pay | Admitting: Internal Medicine

## 2022-02-05 DIAGNOSIS — K625 Hemorrhage of anus and rectum: Secondary | ICD-10-CM

## 2022-02-05 DIAGNOSIS — N1832 Chronic kidney disease, stage 3b: Secondary | ICD-10-CM | POA: Diagnosis not present

## 2022-02-05 DIAGNOSIS — D62 Acute posthemorrhagic anemia: Secondary | ICD-10-CM | POA: Diagnosis not present

## 2022-02-05 DIAGNOSIS — E1122 Type 2 diabetes mellitus with diabetic chronic kidney disease: Secondary | ICD-10-CM

## 2022-02-05 DIAGNOSIS — K7460 Unspecified cirrhosis of liver: Secondary | ICD-10-CM | POA: Diagnosis not present

## 2022-02-05 LAB — BASIC METABOLIC PANEL
Anion gap: 3 — ABNORMAL LOW (ref 5–15)
BUN: 39 mg/dL — ABNORMAL HIGH (ref 8–23)
CO2: 21 mmol/L — ABNORMAL LOW (ref 22–32)
Calcium: 8 mg/dL — ABNORMAL LOW (ref 8.9–10.3)
Chloride: 112 mmol/L — ABNORMAL HIGH (ref 98–111)
Creatinine, Ser: 1.6 mg/dL — ABNORMAL HIGH (ref 0.44–1.00)
GFR, Estimated: 36 mL/min — ABNORMAL LOW (ref >60)
Glucose, Bld: 199 mg/dL — ABNORMAL HIGH (ref 70–99)
Potassium: 3.8 mmol/L (ref 3.5–5.1)
Sodium: 136 mmol/L (ref 135–145)

## 2022-02-05 LAB — IRON AND TIBC
Iron: 89 ug/dL (ref 28–170)
Saturation Ratios: 56 % — ABNORMAL HIGH (ref 10.4–31.8)
TIBC: 160 ug/dL — ABNORMAL LOW (ref 250–450)
UIBC: 71 ug/dL

## 2022-02-05 LAB — CBC
HCT: 19.1 % — ABNORMAL LOW (ref 36.0–46.0)
HCT: 18.6 % — ABNORMAL LOW (ref 36.0–46.0)
HCT: 25.2 % — ABNORMAL LOW (ref 36.0–46.0)
Hemoglobin: 6.2 g/dL — ABNORMAL LOW (ref 12.0–15.0)
Hemoglobin: 6.1 g/dL — ABNORMAL LOW (ref 12.0–15.0)
Hemoglobin: 8.2 g/dL — ABNORMAL LOW (ref 12.0–15.0)
MCH: 24.6 pg — ABNORMAL LOW (ref 26.0–34.0)
MCH: 24.7 pg — ABNORMAL LOW (ref 26.0–34.0)
MCH: 25.1 pg — ABNORMAL LOW (ref 26.0–34.0)
MCHC: 32.5 g/dL (ref 30.0–36.0)
MCHC: 32.8 g/dL (ref 30.0–36.0)
MCHC: 32.5 g/dL (ref 30.0–36.0)
MCV: 75.8 fL — ABNORMAL LOW (ref 80.0–100.0)
MCV: 75.3 fL — ABNORMAL LOW (ref 80.0–100.0)
MCV: 77.1 fL — ABNORMAL LOW (ref 80.0–100.0)
Platelets: 109 10*3/uL — ABNORMAL LOW (ref 150–400)
Platelets: 117 10*3/uL — ABNORMAL LOW (ref 150–400)
Platelets: 164 10*3/uL (ref 150–400)
RBC: 2.52 MIL/uL — ABNORMAL LOW (ref 3.87–5.11)
RBC: 2.47 MIL/uL — ABNORMAL LOW (ref 3.87–5.11)
RBC: 3.27 MIL/uL — ABNORMAL LOW (ref 3.87–5.11)
RDW: 25.3 % — ABNORMAL HIGH (ref 11.5–15.5)
RDW: 24.9 % — ABNORMAL HIGH (ref 11.5–15.5)
RDW: 24.1 % — ABNORMAL HIGH (ref 11.5–15.5)
WBC: 4.2 10*3/uL (ref 4.0–10.5)
WBC: 4.5 10*3/uL (ref 4.0–10.5)
WBC: 6.6 10*3/uL (ref 4.0–10.5)
nRBC: 0 % (ref 0.0–0.2)
nRBC: 0 % (ref 0.0–0.2)
nRBC: 0 % (ref 0.0–0.2)

## 2022-02-05 LAB — GLUCOSE, CAPILLARY: Glucose-Capillary: 131 mg/dL — ABNORMAL HIGH (ref 70–99)

## 2022-02-05 LAB — AMMONIA: Ammonia: 62 umol/L — ABNORMAL HIGH (ref 9–35)

## 2022-02-05 LAB — FERRITIN: Ferritin: 183 ng/mL (ref 11–307)

## 2022-02-05 LAB — VITAMIN B12: Vitamin B-12: 849 pg/mL (ref 180–914)

## 2022-02-05 LAB — PREPARE RBC (CROSSMATCH)

## 2022-02-05 LAB — HIV ANTIBODY (ROUTINE TESTING W REFLEX): HIV Screen 4th Generation wRfx: NONREACTIVE

## 2022-02-05 LAB — FOLATE: Folate: 14.7 ng/mL (ref >5.9)

## 2022-02-05 MED ORDER — SODIUM CHLORIDE 0.9 % IV SOLN
300.0000 mg | Freq: Once | INTRAVENOUS | Status: AC
  Administered 2022-02-05: 300 mg via INTRAVENOUS
  Filled 2022-02-05: qty 300

## 2022-02-05 MED ORDER — SODIUM CHLORIDE 0.9 % IV SOLN
1.0000 g | INTRAVENOUS | Status: DC
  Administered 2022-02-05 – 2022-02-07 (×3): 1 g via INTRAVENOUS
  Filled 2022-02-05: qty 1
  Filled 2022-02-05 (×3): qty 10

## 2022-02-05 MED ORDER — POLYETHYLENE GLYCOL 3350 17 G PO PACK
17.0000 g | PACK | Freq: Every day | ORAL | Status: DC
  Administered 2022-02-05 – 2022-02-09 (×2): 17 g via ORAL
  Filled 2022-02-05 (×2): qty 1

## 2022-02-05 MED ORDER — SODIUM CHLORIDE 0.9% IV SOLUTION: Freq: Once | INTRAVENOUS | Status: AC

## 2022-02-05 MED ORDER — FUROSEMIDE 10 MG/ML IJ SOLN
20.0000 mg | Freq: Once | INTRAMUSCULAR | Status: DC
  Filled 2022-02-05: qty 2

## 2022-02-05 MED ORDER — IOHEXOL 350 MG/ML SOLN
100.0000 mL | Freq: Once | INTRAVENOUS | Status: AC | PRN
  Administered 2022-02-05: 100 mL via INTRAVENOUS

## 2022-02-05 NOTE — Progress Notes (Signed)
[facility] SURGICAL PROGRESS NOTE (cpt [id number])  Hospital Day(s): 1.   Interval History: Patient seen and examined, no acute events or new complaints overnight. Patient reports she is doing fine this morning. No longer bleeding. Her biggest concern is that "she will continue to have these episodes of bleeding and continually present to the ED here. Hgb up to 6.1 (4.7 on admission, s/p transfusion), PLT 109K. Renal function baseline; sCr - 1.60; UO - unmeasured.   Review of Systems:  Constitutional: denies fever, chills  HEENT: denies cough or congestion  Respiratory: denies any shortness of breath  Cardiovascular: denies chest pain or palpitations  Gastrointestinal: + GI Bleeding (resolved), denies abdominal pain, N/V/D Genitourinary: denies burning with urination or urinary frequency Musculoskeletal: denies pain, decreased motor or sensation  Vital signs in last 24 hours: [min-max] current  Temp:  [97.4 F (36.3 C)-98.5 F (36.9 C)] 98.3 F (36.8 C) (06/21 0453) Pulse Rate:  [75-98] 76 (06/21 0800) Resp:  [12-22] 13 (06/21 0800) BP: (87-113)/(38-54) 95/44 (06/21 0800) SpO2:  [96 %-100 %] 97 % (06/21 0800) Weight:  [117.9 kg] 117.9 kg (06/20 1158)     Height: 5' 9"  (175.3 cm) Weight: 117.9 kg BMI (Calculated): 38.37   Intake/Output last 2 shifts:  06/20 0701 - 06/21 0700 In: 1310 [I.V.:50; Blood:1260] Out: -    Physical Exam:  Constitutional: alert, cooperative and no distress  HENT: normocephalic without obvious abnormality  Eyes: PERRL, EOM's grossly intact and symmetric  Respiratory: breathing non-labored at rest  Cardiovascular: regular rate and sinus rhythm  Genitourinary: Chaperone present, external hemorrhoids present to left lateral position and right posterolateral, certainly without any evidence bleeding   Labs:     Latest Ref Rng & Units 02/05/2022    8:40 AM 02/05/2022    5:15 AM 02/04/2022   11:21 PM  CBC  WBC 4.0 - 10.5 K/uL 4.5  4.2  4.0    Hemoglobin 12.0 - 15.0 g/dL 6.1  6.2  5.4   Hematocrit 36.0 - 46.0 % 18.6  19.1  17.4   Platelets 150 - 400 K/uL 117  109  103       Latest Ref Rng & Units 02/05/2022    5:15 AM 02/04/2022   12:45 PM 01/29/2022    4:42 AM  CMP  Glucose 70 - 99 mg/dL 199  264  105   BUN 8 - 23 mg/dL 39  38  35   Creatinine 0.44 - 1.00 mg/dL 1.60  1.76  1.65   Sodium 135 - 145 mmol/L 136  135  136   Potassium 3.5 - 5.1 mmol/L 3.8  3.7  5.0   Chloride 98 - 111 mmol/L 112  108  112   CO2 22 - 32 mmol/L 21  17  20    Calcium 8.9 - 10.3 mg/dL 8.0  8.0  8.1   Total Protein 6.5 - 8.1 g/dL  4.3    Total Bilirubin 0.3 - 1.2 mg/dL  1.5    Alkaline Phos 38 - 126 U/L  85    AST 15 - 41 U/L  36    ALT 0 - 44 U/L  22       Imaging studies: No new pertinent imaging studies   Assessment/Plan: (ICD-10's: K92.2) 66 y.o. 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   - Fortunately, she is without any evidence of active bleeding on examination today and her Hgb is responding appropriately to transfusions. I spent extensive time again this morning reviewing her presentation, examination, and comorbid conditions. Unfortunately, she has quite significant liver disease which poses a barrier to any surgical intervention. She is a Childs C (82% peri-operative mortality), although hemorrhoidectomy is not an intra-abdominal procedure. Her biggest concern is that "she will continue to have these episodes of bleeding and continually present to the ED." I explained to her that even with hemorrhoidectomy she may continue to have these bleeding issues, they may even be exacerbated, and the risk of surgery right now certainly outweigh the benefits. She  seems very understanding of this. She is being followed at [facility] for possible transplant as well, which I do think should be top priority at this time. No indication for any emergent intervention regarding her hemorrhoids. Again, bleeding has ceased.    - Wonder if GI could potentially ban these. Would certainly be less invasive. Will check with Dr Dahlia Byes to see if he had discussion as mentioned in his note yesterday.   - Nothing further to add from a surgical perspective.    All of the above findings and recommendations were discussed with the patient, and the medical team, and all of patient's questions were answered to her expressed satisfaction.  -- Edison Simon, PA-C [facility] 02/05/2022, 9:22 AM M-F: 7am - 4pm

## 2022-02-05 NOTE — Progress Notes (Signed)
Admission profile updated. ?

## 2022-02-05 NOTE — Consult Note (Signed)
Chief Complaint: Patient was seen in consultation today for acute on chronic blood loss secondary to hemorrhoidal bleeding at the request of Locklear, Hilton Cork, MD  Referring Physician(s): Haig Prophet, Hilton Cork, MD  Supervising Physician: Ruthann Cancer  Patient Status: [place] - In-pt  History of Present Illness: Rachel Huber is a 66 y.o. 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 History is obtained today from the patient, her family member in the room and chart review. She states she recently was admitted at [facility] for blood transfusion from bleeding hemorrhoids. She states her hemorrhoids started in April of 2022 and more so recently she has developed more frequent episodes of bleeding requiring multiple admissions for transfusion. She states bleeding occurs when her ascites builds up and even with urination pressure and she experiences large amounts of bleeding even requiring hospitalization and blood transfusion with her hemoglobin dropping in the 4 range. She experiences extreme fatigue during these episodes and occasional chest pain, no syncopal episodes. She presented as an outpatient to [facility] for her scheduled paracentesis on 6/20 and had her hemoglobin drawn prior to presenting to [facility] for her follow-up from recent hospitalization and was told her hemoglobin was 5 and to go to the ED after her paracentesis for admission and blood transfusion. The patient has received multiple units of blood since 6/20 and has been seen by GI while in house for actively bleeding hemorrhoids. IR was  consulted to discuss any intervention options. We recommended CTA imaging to evaluate anatomy for possible embolization to control hemorrhoidal bleeding and US liver doppler to evaluate stent patency of TIPS.   During our evaluation on 6/21, the patient states her hemorrhoid bleeding has stopped and she has had successful BM without evidence of blood. She denies any current chest pain or shortness of breath. She denies any current blood thinner use. The patient denies any recent infections, fever or chills. She has no known complications to sedation. She denies any previous arterial or venous vascular procedures in the past. She is familiar with the procedure of embolization as she has had a family member require a renal embolization.    Past Medical History:  Diagnosis Date   Anxiety    Arthritis    Cirrhosis of liver ([place])    Diabetes mellitus without complication (HCC)    Dyspnea    DOE   GERD (gastroesophageal reflux disease)    Hepatitis    PAST   Hypertension    Neuropathy    Neuropathy, diabetic ([place])    Pneumonia    Sinus complaint    Sleep apnea    CPAP   Thalassemia minor 1992    Past Surgical History:  Procedure Laterality Date   ABDOMINAL HYSTERECTOMY     BREAST BIOPSY Right 02/15/2018   [place] bx 6-6:30 ribbon shape, ONE CORE FRAGMENT WITH FIBROSIS. ONE CORE FRAGMENT WITH PORTION OF A DILATED   BREAST BIOPSY Right 02/15/2018   [place] bx 9:00 heart shape, USUAL DUCTAL HYPERPLASIA   BREAST LUMPECTOMY Right 03/09/2018   Procedure: BREAST LUMPECTOMY x 2;  Surgeon: Benjamine Sprague, DO;  Location: [facility] ORS;  Service:  General;  Laterality: Right;   CATARACT EXTRACTION W/PHACO Left 06/11/2016   Procedure: CATARACT EXTRACTION PHACO AND INTRAOCULAR LENS PLACEMENT (IOC);  Surgeon: Estill Cotta, MD;  Location: [facility] ORS;  Service: Ophthalmology;  Laterality: Left;  Lot # [id number] H US:01:38.6 AP%:26.4 CDE:44.15   CATARACT EXTRACTION W/PHACO Right 06/15/2018   Procedure: CATARACT  EXTRACTION PHACO AND INTRAOCULAR LENS PLACEMENT (IOC);  Surgeon: Birder Robson, MD;  Location: [facility] ORS;  Service: Ophthalmology;  Laterality: Right;  [place] 00:38.2 CDE 4.23 Fluid Pack Lot # [id number]   COLONOSCOPIES     COLONOSCOPY WITH PROPOFOL N/A 10/10/2020   Procedure: COLONOSCOPY WITH PROPOFOL;  Surgeon: Lesly Rubenstein, MD;  Location: [facility] ENDOSCOPY;  Service: Endoscopy;  Laterality: N/A;   COLONOSCOPY WITH PROPOFOL N/A 11/20/2020   Procedure: COLONOSCOPY WITH PROPOFOL;  Surgeon: Lesly Rubenstein, MD;  Location: [facility] ENDOSCOPY;  Service: Endoscopy;  Laterality: N/A;  DM STAT CBC, BMP COVID POSITIVE 09/02/2020   CTR     ESOPHAGOGASTRODUODENOSCOPY (EGD) WITH PROPOFOL N/A 10/10/2020   Procedure: ESOPHAGOGASTRODUODENOSCOPY (EGD) WITH PROPOFOL;  Surgeon: Lesly Rubenstein, MD;  Location: [facility] ENDOSCOPY;  Service: Endoscopy;  Laterality: N/A;  COVID POSITIVE 10/08/2020   IR PARACENTESIS  12/17/2021   JOINT REPLACEMENT     KNEE SURGERY Right 09/24/2017   plates and pins   TOTAL SHOULDER ARTHROPLASTY Right 09/27/2015    Allergies: Latex  Medications: Prior to Admission medications   Medication Sig Start Date End Date Taking? Authorizing Provider  atorvastatin (LIPITOR) 10 MG tablet Take 10 mg by mouth daily. 01/28/21  Yes [provider]  Cholecalciferol 50 MCG (2000 UT) CAPS Take by mouth.   Yes [provider]  diphenhydrAMINE (BENADRYL) 25 MG tablet Take 25 mg by mouth 2 (two) times daily as needed for itching.   Yes [provider]  gabapentin (NEURONTIN) 400 MG capsule Take 800 mg by mouth daily at 2 am. 01/07/18  Yes [provider]  hydrocortisone (ANUSOL-HC) 25 MG suppository Place 1 suppository (25 mg total) rectally 2 (two) times daily. 01/30/22  Yes Emokpae, Courage, MD  lactulose, encephalopathy, (CHRONULAC) 10 GM/15ML SOLN Take 30 mLs (20 g total) by mouth daily. 01/30/22  Yes Emokpae, Courage, MD  melatonin 5 MG TABS Take 10 mg by mouth at  bedtime as needed (sleep).   Yes [provider]  Multiple Vitamins-Minerals (MULTIVITAMIN WITH MINERALS) tablet Take 1 tablet by mouth daily.   Yes [provider]  pantoprazole (PROTONIX) 40 MG tablet Take 1 tablet (40 mg total) by mouth daily. 01/30/22 01/30/23 Yes Emokpae, Courage, MD  sertraline (ZOLOFT) 50 MG tablet Take 50 mg by mouth daily. 12/12/17  Yes [provider]  vitamin B-12 (CYANOCOBALAMIN) 1000 MCG tablet Take 1,000 mcg by mouth daily.   Yes [provider]  acetaminophen (TYLENOL) 650 MG CR tablet Take 1,300 mg by mouth daily as needed for pain.    [provider]  guaiFENesin-dextromethorphan (ROBITUSSIN DM) 100-10 MG/5ML syrup Take 5 mLs by mouth every 4 (four) hours as needed for cough. 09/13/20   Aline August, MD  ondansetron (ZOFRAN-ODT) 4 MG disintegrating tablet Take 1 tablet (4 mg total) by mouth every 8 (eight) hours as needed for nausea or vomiting. 01/30/22   Roxan Hockey, MD  oxymetazoline (AFRIN) 0.05 % nasal spray Place 1 spray into both nostrils 2 (two) times daily as needed.    [provider]     Family History  Problem Relation Age of Onset   Breast cancer Mother 34  Lymphoma Mother    Diabetes Father    Kidney cancer Father    Heart disease Father    Diabetes Sister    Breast cancer Sister 62    Social History   Socioeconomic History   Marital status: Married    Spouse name: Not on file   Number of children: 0   Years of education: Not on file   Highest education level: Not on file  Occupational History   Occupation: EMPLOYED  Tobacco Use   Smoking status: Never   Smokeless tobacco: Never  Vaping Use   Vaping Use: Never used  Substance and Sexual Activity   Alcohol use: No   Drug use: Never   Sexual activity: Not Currently  Other Topics Concern   Not on file  Social History Narrative   Not on file   Social Determinants of Health   Financial Resource Strain: Low Risk   (01/31/2021)   Overall Financial Resource Strain (CARDIA)    Difficulty of Paying Living Expenses: Not hard at all  Food Insecurity: No Food Insecurity (01/31/2021)   Hunger Vital Sign    Worried About Running Out of Food in the Last Year: Never true    [place] in the Last Year: Never true  Transportation Needs: No Transportation Needs (01/31/2021)   PRAPARE - Hydrologist (Medical): No    Lack of Transportation (Non-Medical): No  Physical Activity: Inactive (01/31/2021)   Exercise Vital Sign    Days of Exercise per Week: 0 days    Minutes of Exercise per Session: 0 min  Stress: No Stress Concern Present (01/31/2021)   [place]    Feeling of Stress : Not at all  Social Connections: Moderately Isolated (01/31/2021)   Social Connection and Isolation Panel [NHANES]    Frequency of Communication with Friends and Family: More than three times a week    Frequency of Social Gatherings with Friends and Family: Once a week    Attends Religious Services: Never    Marine scientist or Organizations: No    Attends Archivist Meetings: Never    Marital Status: Married   Review of Systems: A 12 point ROS discussed and pertinent positives are indicated in the HPI above.  All other systems are negative.  Review of Systems  Vital Signs: BP (!) 129/50   Pulse 81   Temp 98.4 F (36.9 C) (Axillary)   Resp (!) 21   Ht 5' 9"  (1.753 m)   Wt 259 lb 14.8 oz (117.9 kg)   SpO2 (P) 100%   BMI 38.38 kg/m   Physical Exam Constitutional:      Appearance: Normal appearance.  HENT:     Head: Normocephalic and atraumatic.  Cardiovascular:     Rate and Rhythm: Normal rate and regular rhythm.     Pulses: Normal pulses.     Comments: RCFA palpable 2+ pulse, RLE DP 2+ palpable, warm, sensation intact, strength intact Pulmonary:     Effort: Pulmonary effort is normal. No respiratory  distress.     Breath sounds: Normal breath sounds.  Abdominal:     General: There is distension.     Palpations: Abdomen is soft.     Tenderness: There is no abdominal tenderness.  Skin:    General: Skin is warm and dry.     Coloration: Skin is pale.  Neurological:     Mental Status: She is  alert and oriented to person, place, and time.    Imaging: US Paracentesis  Result Date: 02/04/2022 INDICATION: Patient with history of NASH cirrhosis with recurrent ascites status post recent TIPS procedure at [facility], maximum volume requested of 8 L to be drawn off per ordering provider. EXAM: ULTRASOUND GUIDED  PARACENTESIS MEDICATIONS: Local 1% lidocaine only. COMPLICATIONS: None immediate. PROCEDURE: Informed written consent was obtained from the patient after a discussion of the risks, benefits and alternatives to treatment. A timeout was performed prior to the initiation of the procedure. Initial ultrasound scanning demonstrates a large amount of ascites within the right upper abdominal quadrant. The right upper abdomen was prepped and draped in the usual sterile fashion. 1% lidocaine was used for local anesthesia. Following this, a 19 gauge, 7-cm, Yueh catheter was introduced. An ultrasound image was saved for documentation purposes. The paracentesis was performed. The catheter was removed and a dressing was applied. The patient tolerated the procedure well without immediate post procedural complication. Vital signs were monitored throughout the entire procedure and remained stable. During the procedure the patient stated that she had recent blood draw earlier this morning at the [facility] clinic and found to have a low hemoglobin and was instructed to go to the emergency department following her paracentesis. The procedure was stopped early secondary to patient's recent lab work revealing low hemoglobin, the patient was transferred to the emergency department as previously scheduled following the procedure  for blood transfusion. Additional paracentesis can be performed after blood transfusion if clinically indicated. Patient received post-procedure intravenous albumin; see nursing notes for details. FINDINGS: A total of approximately 5 L of clear yellow fluid was removed. IMPRESSION: Successful ultrasound-guided paracentesis yielding 5 liters of peritoneal fluid. PLAN: The patient has reportedly had a TIPS procedure at [facility] on 12/26/2021 and is awaiting liver transplant there, she is followed by the liver clinic at [facility]. This exam was performed by Tsosie Billing PA-C, and was supervised and interpreted by Dr. Serafina Royals. Electronically Signed   By: Ruthann Cancer M.D.   On: 02/04/2022 12:09   US Paracentesis  Result Date: 01/28/2022 INDICATION: Ascites EXAM: ULTRASOUND GUIDED RIGHT PARACENTESIS MEDICATIONS: None. COMPLICATIONS: None immediate. PROCEDURE: Informed written consent was obtained from the patient after a discussion of the risks (including hemorrhage and infection, among others), benefits and alternatives to treatment. A timeout was performed prior to the initiation of the procedure. Initial ultrasound scanning demonstrates a large amount of ascites within the right lower abdominal quadrant. The right lower abdomen was prepped and draped in the usual sterile fashion. 1% lidocaine was used for local anesthesia. Following this, a Yueh catheter was introduced. An ultrasound image was saved for documentation purposes. The paracentesis was performed. The catheter was removed and a dressing was applied. The patient tolerated the procedure well without immediate post procedural complication. Postprocedural IV albumin is not necessarily required but might be considered given the patient's relatively low resting blood pressure. FINDINGS: A total of approximately 5.2 L of straw-colored fluid was removed. Samples were sent to the laboratory as requested by the clinical team. IMPRESSION: Successful  ultrasound-guided paracentesis yielding 5.2 liters of peritoneal fluid. Electronically Signed   By: Van Clines M.D.   On: 01/28/2022 14:00   DG CHEST PORT 1 VIEW  Result Date: 01/27/2022 CLINICAL DATA:  Shortness of breath and wheezing. EXAM: PORTABLE CHEST 1 VIEW COMPARISON:  Chest radiograph dated 10/01/2021. FINDINGS: No focal consolidation, pleural effusion, pneumothorax. Top-normal cardiac size. No acute osseous pathology. Right shoulder arthroplasty.  IMPRESSION: No active disease. Electronically Signed   By: Anner Crete M.D.   On: 01/27/2022 21:31   US Paracentesis  Result Date: 01/21/2022 INDICATION: Patient with history of NASH cirrhosis recurrent ascites status post recent TIPS procedure, maximum volume of 8 L to be drawn off per ordering provider. EXAM: ULTRASOUND GUIDED  PARACENTESIS MEDICATIONS: Local 1% lidocaine only. COMPLICATIONS: None immediate. PROCEDURE: Informed written consent was obtained from the patient after a discussion of the risks, benefits and alternatives to treatment. A timeout was performed prior to the initiation of the procedure. Initial ultrasound scanning demonstrates a large amount of ascites within the left lower abdominal quadrant. The left lower abdomen was prepped and draped in the usual sterile fashion. 1% lidocaine was used for local anesthesia. Following this, a 19 gauge, 10-cm, Yueh catheter was introduced. An ultrasound image was saved for documentation purposes. The paracentesis was performed. The catheter was removed and a dressing was applied. The patient tolerated the procedure well without immediate post procedural complication. Patient received post-procedure intravenous albumin; see nursing notes for details. FINDINGS: A total of approximately 7.1 Liters of clear yellow fluid was removed. IMPRESSION: Successful ultrasound-guided paracentesis yielding 7.1 Liters liters of peritoneal fluid. The patient has reportedly recently had a TIPS at [facility] on 12/26/2021, and is followed by the liver clinic there. This exam was performed by Tsosie Billing PA-C, and was supervised and interpreted by Dr. Annamaria Boots. Electronically Signed   By: Jerilynn Mages.  Shick M.D.   On: 01/21/2022 11:06   US Paracentesis  Result Date: 01/14/2022 INDICATION: Patient with history of NASH cirrhosis recurrent ascites status post recent TIPS procedure, maximum volume of 8 L to be drawn off per ordering provider. EXAM: ULTRASOUND GUIDED  PARACENTESIS MEDICATIONS: Local 1% lidocaine only. COMPLICATIONS: None immediate. PROCEDURE: Informed written consent was obtained from the patient after a discussion of the risks, benefits and alternatives to treatment. A timeout was performed prior to the initiation of the procedure. Initial ultrasound scanning demonstrates a large amount of ascites within the right lower abdominal quadrant. The right lower abdomen was prepped and draped in the usual sterile fashion. 1% lidocaine was used for local anesthesia. Following this, a 19 gauge, 10-cm, Yueh catheter was introduced. An ultrasound image was saved for documentation purposes. The paracentesis was performed. The catheter was removed and a dressing was applied. The patient tolerated the procedure well without immediate post procedural complication. Patient received post-procedure intravenous albumin; see nursing notes for details. FINDINGS: A total of approximately 8 L of clear yellow fluid was removed. IMPRESSION: Successful ultrasound-guided paracentesis yielding 8 liters of peritoneal fluid. The patient has reportedly recently had a TIPS at [facility] on 12/26/2021, and is followed by the liver clinic there. This exam was performed by Tsosie Billing PA-C, and was supervised and interpreted by Dr. Laurence Ferrari. Electronically Signed   By: Jacqulynn Cadet M.D.   On: 01/14/2022 15:13   US Paracentesis  Result Date: 01/07/2022 INDICATION: Patient with history of NASH cirrhosis  recurrent ascites status post recent tips procedure, maximum volume of 8 L to be drawn off per ordering provider. EXAM: ULTRASOUND GUIDED THERAPEUTIC PARACENTESIS MEDICATIONS: Local 1% lidocaine only. COMPLICATIONS: None immediate. PROCEDURE: Informed written consent was obtained from the patient after a discussion of the risks, benefits and alternatives to treatment. A timeout was performed prior to the initiation of the procedure. Initial ultrasound scanning demonstrates a large amount of ascites within the left lower abdominal quadrant. The left lower abdomen was prepped  and draped in the usual sterile fashion. 1% lidocaine was used for local anesthesia. Following this, a 19 gauge, 10-cm, Yueh catheter was introduced. An ultrasound image was saved for documentation purposes. The paracentesis was performed. The catheter was removed and a dressing was applied. The patient tolerated the procedure well without immediate post procedural complication. Patient received post-procedure intravenous albumin; see nursing notes for details. This exam was performed by Tsosie Billing PA-C, and was supervised and interpreted by Dr. Maryelizabeth Kaufmann. FINDINGS: A total of approximately 8 L of clear yellow fluid was removed. IMPRESSION: Successful ultrasound-guided therapeutic paracentesis yielding 8 liters of peritoneal fluid. PLAN: The patient has reportedly recently had a TIPS at [facility] on 12/26/2021, and is followed by the liver clinic there. She is therefore not a candidate for evaluation at the [facility] interventional radiology portal hypertension clinic. Michaelle Birks, MD [facility] Radiology Electronically Signed   By: Michaelle Birks M.D.   On: 01/07/2022 14:31    Labs:  CBC: Recent Labs    02/04/22 1245 02/04/22 2321 02/05/22 0515 02/05/22 0840  WBC 5.0 4.0 4.2 4.5  HGB 4.7* 5.4* 6.2* 6.1*  HCT 15.0* 17.4* 19.1* 18.6*  PLT 139* 103* 109* 117*     COAGS: Recent Labs    01/27/22 1808 01/28/22 1707 02/04/22 1245  INR 1.2 1.2 1.3*  APTT  --   --  31    BMP: Recent Labs    01/28/22 0507 01/29/22 0442 02/04/22 1245 02/05/22 0515  NA 136 136 135 136  K 4.6 5.0 3.7 3.8  CL 112* 112* 108 112*  CO2 19* 20* 17* 21*  GLUCOSE 146* 105* 264* 199*  BUN 36* 35* 38* 39*  CALCIUM 8.3* 8.1* 8.0* 8.0*  CREATININE 1.67* 1.65* 1.76* 1.60*  GFRNONAA 34* 34* 32* 36*    LIVER FUNCTION TESTS: Recent Labs    01/28/22 0507 02/04/22 1245  BILITOT 2.1* 1.5*  AST 33 36  ALT 18 22  ALKPHOS 88 85  PROT 4.6* 4.3*  ALBUMIN 2.5* 2.5*    Assessment and Plan: This is 66 year old female with PMHx significant for 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   Patient is currently admitted with acute blood loss from bleeding hemorrhoids and GI has seen the patient and requested IR consult. Imaging has been reviewed by my attending, Dr. Serafina Royals and patient is a candidate for embolization to control hemorrhoidal bleeding. Her bleeding is multi-factorial with PMHx significant for portal venous hypertension s/p TIPS and recurrent large volume ascites. Recommend US liver doppler to evaluate TIPS patency. We will plan on visceral arteriogram and possible embolization this Friday 6/23.   Risks and benefits of image guided visceral arteriogram with embolization with moderate sedation were discussed with the patient and her family today including, but not limited to bleeding, infection, vascular injury, bowel injury, non-target embolization, or contrast induced renal failure.  This interventional procedure involves the use of X-rays and because of the nature of the planned procedure, it is possible that we will have prolonged use of X-ray  fluoroscopy.  Potential radiation risks to you include (but are not limited to) the following: - A slightly elevated risk for cancer  several years later in life. This risk is typically less than 0.5% percent. This risk is low  in comparison to the normal incidence of human cancer, which is 33% for women and 50% for men according to the [employer]. - Radiation induced injury can include skin redness, resembling a rash, tissue breakdown / ulcers and hair loss (which can be temporary or permanent).   The likelihood of either of these occurring depends on the difficulty of the procedure and whether you are sensitive to radiation due to previous procedures, disease, or genetic conditions.   IF your procedure requires a prolonged use of radiation, you will be notified and given written instructions for further action.  It is your responsibility to monitor the irradiated area for the 2 weeks following the procedure and to notify your physician if you are concerned that you have suffered a radiation induced injury.    All of the patient's questions were answered, patient is agreeable to proceed.  Consent signed and in chart.   Thank you for this interesting consult.  I greatly enjoyed meeting SHATAVIA SANTOR and look forward to participating in their care.  A copy of this report was sent to the requesting provider on this date.  Electronically Signed: Hedy Jacob, PA-C 02/05/2022, 4:03 PM   I spent a total of 40 Minutes in face to face in clinical consultation, greater than 50% of which was counseling/coordinating care for acute blood loss secondary to bleeding hemorrhoids with portal venous hypertension and ascites.

## 2022-02-05 NOTE — Progress Notes (Signed)
Pt refuses CPAP, states she does wear one at home but does not wish to bring hers in nor wear one of ours.

## 2022-02-05 NOTE — Progress Notes (Signed)
IR aware of patient and request for consult given acute blood loss with active bleeding hemorrhoids. We have reviewed CT imaging today and have seen the patient in full consult and plan to perform visceral arteriogram with embolization on Friday 6/23 with Dr. Serafina Royals.   Full consult note to follow.   Hedy Jacob, PA-C 02/05/2022, 4:55 PM

## 2022-02-05 NOTE — Consult Note (Signed)
Consultation  Referring Provider:     Surgery Admit date: 02/04/2022 Consult date: 02/05/2022         Reason for Consultation: Hematochezia              HPI:   Rachel Huber is a 66 y.o. 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 Patient was at outside facility about a week ago where she required three units of pRBC's for hematochezia attributed to hemorrhoids. Last colonoscopy about a year ago with not great prep but did have grade III-IV hemorrhoids on retroflexion. She notes that when her abdomen becomes more distended with ascites, her hemorrhoids protrude more. Baseline hemoglobin is 7-8. Last bowel movement was early this morning with small amount of clot. Patient denies any constipation.  Past Medical History:  Diagnosis Date   Anxiety    Arthritis    Cirrhosis of liver ([place])    Diabetes mellitus without complication (HCC)    Dyspnea    DOE   GERD (gastroesophageal reflux disease)    Hepatitis    PAST   Hypertension    Neuropathy    Neuropathy, diabetic ([place])    Pneumonia    Sinus complaint    Sleep apnea    CPAP   Thalassemia minor 1992    Past Surgical History:  Procedure Laterality Date   ABDOMINAL HYSTERECTOMY     BREAST BIOPSY Right 02/15/2018   [place] bx 6-6:30 ribbon shape, ONE CORE FRAGMENT WITH FIBROSIS. ONE CORE FRAGMENT WITH PORTION OF A DILATED   BREAST BIOPSY Right 02/15/2018   [place] bx 9:00 heart shape, USUAL DUCTAL HYPERPLASIA   BREAST LUMPECTOMY Right 03/09/2018   Procedure: BREAST LUMPECTOMY x 2;  Surgeon: Benjamine Sprague, DO;  Location: [facility] ORS;  Service: General;  Laterality: Right;   CATARACT EXTRACTION W/PHACO Left 06/11/2016   Procedure: CATARACT EXTRACTION PHACO AND INTRAOCULAR LENS PLACEMENT (IOC);  Surgeon: Estill Cotta, MD;  Location: [facility] ORS;  Service: Ophthalmology;  Laterality: Left;  Lot #  [id number] H US:01:38.6 AP%:26.4 CDE:44.15   CATARACT EXTRACTION W/PHACO Right 06/15/2018   Procedure: CATARACT EXTRACTION PHACO AND INTRAOCULAR LENS PLACEMENT (IOC);  Surgeon: Birder Robson, MD;  Location: [facility] ORS;  Service: Ophthalmology;  Laterality: Right;  [place] 00:38.2 CDE 4.23 Fluid Pack Lot # [id number]   COLONOSCOPIES     COLONOSCOPY WITH PROPOFOL N/A 10/10/2020   Procedure: COLONOSCOPY WITH PROPOFOL;  Surgeon: Lesly Rubenstein, MD;  Location: [facility] ENDOSCOPY;  Service: Endoscopy;  Laterality: N/A;   COLONOSCOPY WITH PROPOFOL N/A 11/20/2020   Procedure: COLONOSCOPY WITH PROPOFOL;  Surgeon: Lesly Rubenstein, MD;  Location: [facility] ENDOSCOPY;  Service: Endoscopy;  Laterality: N/A;  DM STAT CBC, BMP COVID POSITIVE 09/02/2020   CTR     ESOPHAGOGASTRODUODENOSCOPY (EGD) WITH PROPOFOL N/A 10/10/2020   Procedure: ESOPHAGOGASTRODUODENOSCOPY (EGD) WITH PROPOFOL;  Surgeon: Lesly Rubenstein, MD;  Location: [facility] ENDOSCOPY;  Service: Endoscopy;  Laterality: N/A;  COVID POSITIVE 10/08/2020   IR PARACENTESIS  12/17/2021   JOINT REPLACEMENT     KNEE SURGERY Right 09/24/2017   plates and pins   TOTAL SHOULDER ARTHROPLASTY Right 09/27/2015    Family History  Problem Relation Age of Onset   Breast cancer Mother 73   Lymphoma Mother    Diabetes Father    Kidney cancer Father    Heart disease Father    Diabetes Sister    Breast cancer Sister 62  Social History   Tobacco Use   Smoking status: Never   Smokeless tobacco: Never  Vaping Use   Vaping Use: Never used  Substance Use Topics   Alcohol use: No   Drug use: Never    Prior to Admission medications   Medication Sig Start Date End Date Taking? Authorizing Provider  atorvastatin (LIPITOR) 10 MG tablet Take 10 mg by mouth daily. 01/28/21  Yes [provider]  Cholecalciferol 50 MCG (2000 UT) CAPS Take by mouth.   Yes [provider]  diphenhydrAMINE (BENADRYL) 25 MG tablet Take 25 mg by mouth 2 (two) times daily as  needed for itching.   Yes [provider]  gabapentin (NEURONTIN) 400 MG capsule Take 800 mg by mouth daily at 2 am. 01/07/18  Yes [provider]  hydrocortisone (ANUSOL-HC) 25 MG suppository Place 1 suppository (25 mg total) rectally 2 (two) times daily. 01/30/22  Yes Emokpae, Courage, MD  lactulose, encephalopathy, (CHRONULAC) 10 GM/15ML SOLN Take 30 mLs (20 g total) by mouth daily. 01/30/22  Yes Emokpae, Courage, MD  melatonin 5 MG TABS Take 10 mg by mouth at bedtime as needed (sleep).   Yes [provider]  Multiple Vitamins-Minerals (MULTIVITAMIN WITH MINERALS) tablet Take 1 tablet by mouth daily.   Yes [provider]  pantoprazole (PROTONIX) 40 MG tablet Take 1 tablet (40 mg total) by mouth daily. 01/30/22 01/30/23 Yes Emokpae, Courage, MD  sertraline (ZOLOFT) 50 MG tablet Take 50 mg by mouth daily. 12/12/17  Yes [provider]  vitamin B-12 (CYANOCOBALAMIN) 1000 MCG tablet Take 1,000 mcg by mouth daily.   Yes [provider]  acetaminophen (TYLENOL) 650 MG CR tablet Take 1,300 mg by mouth daily as needed for pain.    [provider]  guaiFENesin-dextromethorphan (ROBITUSSIN DM) 100-10 MG/5ML syrup Take 5 mLs by mouth every 4 (four) hours as needed for cough. 09/13/20   Aline August, MD  ondansetron (ZOFRAN-ODT) 4 MG disintegrating tablet Take 1 tablet (4 mg total) by mouth every 8 (eight) hours as needed for nausea or vomiting. 01/30/22   Roxan Hockey, MD  oxymetazoline (AFRIN) 0.05 % nasal spray Place 1 spray into both nostrils 2 (two) times daily as needed.    [provider]    Current Facility-Administered Medications  Medication Dose Route Frequency Provider Last Rate Last Admin   0.9 %  sodium chloride infusion (Manually program via Guardrails IV Fluids)   Intravenous Once Loletha Grayer, MD       acetaminophen (TYLENOL) tablet 650 mg  650 mg Oral Q6H PRN Ivor Costa, MD       atorvastatin (LIPITOR) tablet 10 mg   10 mg Oral Daily Ivor Costa, MD   10 mg at 02/05/22 1142   cholecalciferol (VITAMIN D3) tablet 2,000 Units  2,000 Units Oral Q1400 Ivor Costa, MD       furosemide (LASIX) injection 20 mg  20 mg Intravenous Once Loletha Grayer, MD       gabapentin (NEURONTIN) capsule 800 mg  800 mg Oral Q0200 Ivor Costa, MD       hydrALAZINE (APRESOLINE) injection 5 mg  5 mg Intravenous Q2H PRN Ivor Costa, MD       hydrocortisone (ANUSOL-HC) suppository 25 mg  25 mg Rectal BID Ivor Costa, MD   25 mg at 02/05/22 1150   lactulose (CHRONULAC) 10 GM/15ML solution 20 g  20 g Oral Daily Ivor Costa, MD   20 g at 02/05/22 1142   melatonin tablet 10 mg  10 mg Oral QHS PRN Ivor Costa, MD       multivitamin with minerals tablet 1 tablet  1 tablet Oral Daily Ivor Costa, MD   1 tablet at 02/05/22 1142   ondansetron (ZOFRAN) injection 4 mg  4 mg Intravenous Q8H PRN Ivor Costa, MD       pantoprazole (PROTONIX) injection 40 mg  40 mg Intravenous Q12H Ivor Costa, MD   40 mg at 02/05/22 1143   sertraline (ZOLOFT) tablet 50 mg  50 mg Oral Daily Ivor Costa, MD   50 mg at 02/05/22 1142   vitamin B-12 (CYANOCOBALAMIN) tablet 1,000 mcg  1,000 mcg Oral Daily Ivor Costa, MD   1,000 mcg at 02/05/22 1142    Allergies as of 02/04/2022 - Review Complete 02/04/2022  Allergen Reaction Noted   Latex Rash 09/02/2016     Review of Systems:    All systems reviewed and negative except where noted in HPI.  Review of Systems  Constitutional:  Positive for malaise/fatigue. Negative for chills and fever.  Cardiovascular:  Negative for chest pain.  Gastrointestinal:  Positive for blood in stool and diarrhea. Negative for abdominal pain, constipation, melena, nausea and vomiting.  Musculoskeletal:  Positive for joint pain.  Neurological:  Negative for focal weakness.  Psychiatric/Behavioral:  Negative for substance abuse.   All other systems reviewed and are negative.      Physical Exam:  Vital signs in last 24 hours: Temp:  [97.4 F  (36.3 C)-98.5 F (36.9 C)] 97.6 F (36.4 C) (06/21 1147) Pulse Rate:  [72-98] 72 (06/21 1147) Resp:  [12-22] 12 (06/21 1147) BP: (87-116)/(38-54) 116/43 (06/21 1147) SpO2:  [96 %-100 %] 99 % (06/21 1147)   General:   Pleasant in NAD Head:  Normocephalic and atraumatic. Eyes:   No icterus.   Conjunctiva pink. Mouth: Mucosa pink moist, no lesions. Neck:  Supple; no masses felt Lungs:  No respiratory distress Heart:  S1S2, RRR, no MRG. Abdomen:  distended Rectal:  Not performed.  Msk:  MAEW x4, No clubbing or cyanosis. Strength 5/5 Neurologic:  No focal deficits Skin:  Warm, dry, pink without significant lesions or rashes. Psych:  Alert and cooperative. Normal affect.  LAB RESULTS: Recent Labs    02/04/22 2321 02/05/22 0515 02/05/22 0840  WBC 4.0 4.2 4.5  HGB 5.4* 6.2* 6.1*  HCT 17.4* 19.1* 18.6*  PLT 103* 109* 117*   BMET Recent Labs    02/04/22 1245 02/05/22 0515  NA 135 136  K 3.7 3.8  CL 108 112*  CO2 17* 21*  GLUCOSE 264* 199*  BUN 38* 39*  CREATININE 1.76* 1.60*  CALCIUM 8.0* 8.0*   LFT Recent Labs    02/04/22 1245  PROT 4.3*  ALBUMIN 2.5*  AST 36  ALT 22  ALKPHOS 85  BILITOT 1.5*   PT/INR Recent Labs    02/04/22 1245  LABPROT 15.6*  INR 1.3*    STUDIES: US Paracentesis  Result Date: 02/04/2022 INDICATION: Patient with history of NASH cirrhosis with recurrent ascites status post recent TIPS procedure at [facility], maximum volume requested of 8 L to be drawn off per ordering provider. EXAM: ULTRASOUND GUIDED  PARACENTESIS MEDICATIONS: Local 1% lidocaine only. COMPLICATIONS: None immediate. PROCEDURE: Informed written consent was obtained from the patient after a discussion of the risks, benefits and alternatives to treatment. A timeout was performed prior to the initiation of the procedure. Initial ultrasound scanning demonstrates a large amount of ascites within the right upper abdominal quadrant. The right upper abdomen  was prepped and draped in  the usual sterile fashion. 1% lidocaine was used for local anesthesia. Following this, a 19 gauge, 7-cm, Yueh catheter was introduced. An ultrasound image was saved for documentation purposes. The paracentesis was performed. The catheter was removed and a dressing was applied. The patient tolerated the procedure well without immediate post procedural complication. Vital signs were monitored throughout the entire procedure and remained stable. During the procedure the patient stated that she had recent blood draw earlier this morning at the [facility] clinic and found to have a low hemoglobin and was instructed to go to the emergency department following her paracentesis. The procedure was stopped early secondary to patient's recent lab work revealing low hemoglobin, the patient was transferred to the emergency department as previously scheduled following the procedure for blood transfusion. Additional paracentesis can be performed after blood transfusion if clinically indicated. Patient received post-procedure intravenous albumin; see nursing notes for details. FINDINGS: A total of approximately 5 L of clear yellow fluid was removed. IMPRESSION: Successful ultrasound-guided paracentesis yielding 5 liters of peritoneal fluid. PLAN: The patient has reportedly had a TIPS procedure at [facility] on 12/26/2021 and is awaiting liver transplant there, she is followed by the liver clinic at [facility]. This exam was performed by Tsosie Billing PA-C, and was supervised and interpreted by Dr. Serafina Royals. Electronically Signed   By: Ruthann Cancer M.D.   On: 02/04/2022 12:09       Impression / Plan:   67 y.o. 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 Hemorrhoids rarely cause bleeding enough to require transfusions but in a patient with cirrhosis, you can have  "varicealization" of hemorrhoids which can produce significant bleeding. Thankfully she has had a TIPS that was patent on last check. Hemorrhoid banding on such large hemorrhoids in a patient with decompensated cirrhosis is contraindicated and she would be at high risk for surgical complications for surgery of her hemorrhoids. Will touch Huber with IR for any potential minimally invasive therapies. I did talk to the patient about doing a full colonoscopy which she declines but willing to undergo flex sig  - will get CTA to evaluate TIPS and vasculature, can also be used for any potential vascular intervention - will tentatively plan on flex sig tomorrow so NPO at midnight, will need enema one hour before procedure - transfuse to keep hemoglobin > 7 - will order SBP prophylaxis given GI bleeding  Will continue to follow, please call with any questions or concerns.  Raylene Miyamoto MD, MPH [place]

## 2022-02-05 NOTE — Progress Notes (Signed)
  Progress Note   Patient: Rachel Huber AYT:[id number] DOB: 05-07-1956 DOA: 02/04/2022     1 DOS: the patient was seen and examined on 02/05/2022     Assessment and Plan: * GI bleeding  Source likely hemorrhoidal bleeding.  GI to do a flex sig tomorrow.  General surgery team hesitant on doing any procedures for hemorrhoids secondary to cirrhosis of the liver.  Acute blood loss anemia Received 3 units of packed red blood cells on admission.  With hemoglobin being 6.1 today we will give another unit of blood and IV Venofer 300 mg later today.  Cirrhosis of liver (HCC) Mental status normal.  INR 1.3, recent TIPS procedure.  Ammonia level 62.  On lactulose daily.  HLD (hyperlipidemia) Lipitor  Chronic kidney disease, stage 3b ([place]) Close to baseline.  Recent baseline creatinine 1.65 on 01/29/2022.  Her creatinine is 1.76, BUN 38. -Follow-up renal function with BMP  Type II diabetes mellitus with renal manifestations (HCC) Recent A1c 5.7.  Diet controlled.     Thalassemia minor Continue to monitor hemoglobin  Depression with anxiety Continue Zoloft        Subjective: Patient came in with a low hemoglobin and feeling weak.  Feels a little bit better.  Received 3 units of packed red blood cells yesterday.  Keeps having hemorrhoidal bleeding at home.  She has a history of thalassemia and her normal hemoglobin is between 7 and 8.  Physical Exam: Vitals:   02/05/22 0800 02/05/22 1031 02/05/22 1147 02/05/22 1535  BP: (!) 95/44 (!) 107/48 (!) 116/43 (!) 129/50  Pulse: 76 76 72 81  Resp: 13 17 12  (!) 21  Temp:  97.8 F (36.6 C) 97.6 F (36.4 C) 98.4 F (36.9 C)  TempSrc:  Oral Oral Axillary  SpO2: 97% 98% 99%   Weight:      Height:       Physical Exam Exam conducted with a chaperone present.  HENT:     Head: Normocephalic.     Mouth/Throat:     Pharynx: No oropharyngeal exudate.  Eyes:     General: Lids are normal.     Conjunctiva/sclera: Conjunctivae normal.   Cardiovascular:     Rate and Rhythm: Normal rate and regular rhythm.     Heart sounds: Normal heart sounds, S1 normal and S2 normal.  Pulmonary:     Breath sounds: No decreased breath sounds, wheezing, rhonchi or rales.  Abdominal:     Palpations: Abdomen is soft.     Tenderness: There is no abdominal tenderness.     Comments: The external hemorrhoids were nonbleeding.  Musculoskeletal:     Right lower leg: No swelling.     Left lower leg: No swelling.  Skin:    General: Skin is warm.     Findings: No rash.  Neurological:     Mental Status: She is alert and oriented to person, place, and time.     Data Reviewed: First hemoglobin 4.7 on last hemoglobin 6.1, platelet count 117, creatinine 1.6  Family Communication: Updated husband on the phone  Disposition: Status is: Inpatient Remains inpatient appropriate because: Admitted with severe anemia.  Gastroenterology to do a flex sig tomorrow.  Planned Discharge Destination: Home   Author: Loletha Grayer, MD 02/05/2022 3:44 PM  For on call review [email or web address].

## 2022-02-06 ENCOUNTER — Telehealth (HOSPITAL_COMMUNITY): Payer: Self-pay

## 2022-02-06 ENCOUNTER — Inpatient Hospital Stay: Payer: 59 | Admitting: Anesthesiology

## 2022-02-06 ENCOUNTER — Encounter: Payer: Self-pay | Admitting: Internal Medicine

## 2022-02-06 ENCOUNTER — Ambulatory Visit (HOSPITAL_COMMUNITY): Payer: 59

## 2022-02-06 ENCOUNTER — Inpatient Hospital Stay: Payer: 59

## 2022-02-06 ENCOUNTER — Encounter
Admission: EM | Disposition: A | Discharge status: Home / Self Care | Payer: Self-pay | Source: Home / Self Care | Attending: Internal Medicine

## 2022-02-06 DIAGNOSIS — K625 Hemorrhage of anus and rectum: Secondary | ICD-10-CM | POA: Diagnosis not present

## 2022-02-06 DIAGNOSIS — D62 Acute posthemorrhagic anemia: Secondary | ICD-10-CM | POA: Diagnosis not present

## 2022-02-06 DIAGNOSIS — K7460 Unspecified cirrhosis of liver: Secondary | ICD-10-CM | POA: Diagnosis not present

## 2022-02-06 DIAGNOSIS — N1832 Chronic kidney disease, stage 3b: Secondary | ICD-10-CM | POA: Diagnosis not present

## 2022-02-06 DIAGNOSIS — R188 Other ascites: Secondary | ICD-10-CM

## 2022-02-06 HISTORY — PX: FLEXIBLE SIGMOIDOSCOPY: SHX5431

## 2022-02-06 LAB — TYPE AND SCREEN
ABO/RH(D): A POS
Antibody Screen: NEGATIVE
Unit division: 0
Unit division: 0
Unit division: 0
Unit division: 0

## 2022-02-06 LAB — BPAM RBC
Blood Product Expiration Date: 202307092359
Blood Product Expiration Date: 202307112359
Blood Product Expiration Date: 202307122359
Blood Product Expiration Date: 202307132359
ISSUE DATE / TIME: 202306201440
ISSUE DATE / TIME: 202306201913
ISSUE DATE / TIME: 202306202351
ISSUE DATE / TIME: 202306211531
Unit Type and Rh: 6200
Unit Type and Rh: 6200
Unit Type and Rh: 6200
Unit Type and Rh: 6200

## 2022-02-06 LAB — GLUCOSE, CAPILLARY: Glucose-Capillary: 168 mg/dL — ABNORMAL HIGH (ref 70–99)

## 2022-02-06 SURGERY — SIGMOIDOSCOPY, FLEXIBLE
Anesthesia: General

## 2022-02-06 MED ORDER — PROPOFOL 10 MG/ML IV BOLUS
INTRAVENOUS | Status: DC | PRN
  Administered 2022-02-06: 30 mg via INTRAVENOUS
  Administered 2022-02-06: 100 mg via INTRAVENOUS

## 2022-02-06 MED ORDER — SODIUM CHLORIDE 0.9 % IV SOLN: INTRAVENOUS | Status: DC | PRN

## 2022-02-06 MED ORDER — SODIUM CHLORIDE 0.9 % IV SOLN: INTRAVENOUS | Status: DC

## 2022-02-06 NOTE — Op Note (Signed)
[facility] Gastroenterology Patient Name: Rachel Huber Procedure Date: 02/06/2022 11:51 AM MRN: [id number] Account #: [id number] Date of Birth: 10-08-1955 Admit Type: Outpatient Age: 66 Room: [facility] ENDO ROOM 4 Gender: Female Note Status: Finalized Instrument Name: Peds Colonoscope [id number] Procedure:             Flexible Sigmoidoscopy Indications:           Hematochezia Providers:             Andrey Farmer MD, MD Medicines:             Monitored Anesthesia Care Complications:         No immediate complications. Procedure:             Pre-Anesthesia Assessment:                        - Prior to the procedure, a History and Physical was                         performed, and patient medications and allergies were                         reviewed. The patient is competent. The risks and                         benefits of the procedure and the sedation options and                         risks were discussed with the patient. All questions                         were answered and informed consent was obtained.                         Patient identification and proposed procedure were                         verified by the physician, the nurse, the                         anesthesiologist, the anesthetist and the technician                         in the endoscopy suite. Mental Status Examination:                         alert and oriented. Airway Examination: normal                         oropharyngeal airway and neck mobility. Respiratory                         Examination: clear to auscultation. CV Examination:                         normal. Prophylactic Antibiotics: The patient does not                         require prophylactic antibiotics. Prior  Anticoagulants: The patient has taken no previous                         anticoagulant or antiplatelet agents. ASA Grade                         Assessment: III - A patient with severe  systemic                         disease. After reviewing the risks and benefits, the                         patient was deemed in satisfactory condition to                         undergo the procedure. The anesthesia plan was to use                         monitored anesthesia care (MAC). Immediately prior to                         administration of medications, the patient was                         re-assessed for adequacy to receive sedatives. The                         heart rate, respiratory rate, oxygen saturations,                         blood pressure, adequacy of pulmonary ventilation, and                         response to care were monitored throughout the                         procedure. The physical status of the patient was                         re-assessed after the procedure.                        After obtaining informed consent, the scope was passed                         under direct vision. The Colonoscope was introduced                         through the anus and advanced to the the rectum. The                         flexible sigmoidoscopy was accomplished without                         difficulty. The patient tolerated the procedure well.                         The quality of the bowel preparation was fair. Findings:  Prolapsed hemorrhoids were found on perianal exam.      Internal hemorrhoids were found during retroflexion. The hemorrhoids       were Grade III-IV (internal hemorrhoids that prolapse and cannot be       reduced manually). No blood was seen on endoscopic exam so bleeding       likely coming from large hemorrhoids. Impression:            - Preparation of the colon was fair.                        - Prolapsed hemorrhoids found on perianal exam.                        - Internal hemorrhoids.                        - No specimens collected. Recommendation:        - Advance diet as tolerated.                        - Return patient to  hospital ward for ongoing care. Procedure Code(s):     --- Professional ---                        [phone], 52, Sigmoidoscopy, flexible; diagnostic,                         including collection of specimen(s) by brushing or                         washing, when performed (separate procedure) Diagnosis Code(s):     --- Professional ---                        K64.3, Fourth degree hemorrhoids                        K92.1, Melena (includes Hematochezia) CPT copyright 2019 American Medical Association. All rights reserved. The codes documented in this report are preliminary and upon coder review may  be revised to meet current compliance requirements. Andrey Farmer MD, MD 02/06/2022 1:32:29 PM Number of Addenda: 0 Note Initiated On: 02/06/2022 11:51 AM Total Procedure Duration: 0 hours 2 minutes 9 seconds  Estimated Blood Loss:  Estimated blood loss: none.      [facility]

## 2022-02-06 NOTE — Anesthesia Preprocedure Evaluation (Addendum)
Anesthesia Evaluation  Patient identified by MRN, date of birth, ID band Patient awake    Reviewed: Allergy & Precautions, NPO status , Patient's Chart, lab work & pertinent test results  History of Anesthesia Complications Negative for: history of anesthetic complications  Airway Mallampati: IV   Neck ROM: Full    Dental  (+) Missing Crowns :   Pulmonary sleep apnea and Continuous Positive Airway Pressure Ventilation ,    Pulmonary exam normal breath sounds clear to auscultation       Cardiovascular hypertension, Normal cardiovascular exam Rhythm:Regular Rate:Normal  ECG 02/04/22:  Accelerated junctional rhythm Low voltage, extremity and precordial leads Prolonged QT interval  Echo 11/15/21:  NORMAL LEFT VENTRICULAR SYSTOLIC FUNCTION WITH MILD LVH  NORMAL LA PRESSURESWITH NORMAL DIASTOLIC FUNCTION  NORMAL RIGHT VENTRICULAR SYSTOLIC FUNCTION  VALVULAR REGURGITATION: TRIVIAL MR, TRIVIAL PR, TRIVIAL TR  NO VALVULAR STENOSIS  NEGATIVE SALINE MICROCAVITATION STUDY AT REST AND AFTER VALSALVA  INSUFFICIENT TR TO ESTIMATE RVSP  ASCITES AND PLEURAL EFFUSION PRESENT  3D acquisition and reconstructions were performed as part of this examination to more accurately quantify the effects of identified structural abnormalities as part of the exam. (post-processing on an Independent workstation).  Compared with prior Echo study on 07/23/2021: NO SIGNIFICANT CHANGES   Neuro/Psych PSYCHIATRIC DISORDERS Anxiety Depression negative neurological ROS     GI/Hepatic negative GI ROS, (+) Cirrhosis       , Hepatitis -  Endo/Other  diabetes, Type 2Obesity   Renal/GU Renal disease (stage III CKD)     Musculoskeletal  (+) Arthritis ,   Abdominal   Peds  Hematology  (+) Blood dyscrasia, anemia ,   Anesthesia Other Findings   Reproductive/Obstetrics                            Anesthesia  Physical Anesthesia Plan  ASA: 3  Anesthesia Plan: General   Post-op Pain Management:    Induction: Intravenous  PONV Risk Score and Plan: 3 and Propofol infusion, TIVA and Treatment may vary due to age or medical condition  Airway Management Planned: Natural Airway  Additional Equipment:   Intra-op Plan:   Post-operative Plan:   Informed Consent: I have reviewed the patients History and Physical, chart, labs and discussed the procedure including the risks, benefits and alternatives for the proposed anesthesia with the patient or authorized representative who has indicated his/her understanding and acceptance.       Plan Discussed with: CRNA  Anesthesia Plan Comments: (LMA/GETA backup discussed.  Patient consented for risks of anesthesia including but not limited to:  - adverse reactions to medications - damage to eyes, teeth, lips or other oral mucosa - nerve damage due to positioning  - sore throat or hoarseness - damage to heart, brain, nerves, lungs, other parts of body or loss of life  Informed patient about role of CRNA in peri- and intra-operative care.  Patient voiced understanding.)        Anesthesia Quick Evaluation

## 2022-02-06 NOTE — Transfer of Care (Signed)
Immediate Anesthesia Transfer of Care Note  Patient: Rachel Huber  Procedure(s) Performed: FLEXIBLE SIGMOIDOSCOPY  Patient Location: Endoscopy Unit  Anesthesia Type:General  Level of Consciousness: drowsy  Airway & Oxygen Therapy: Patient Spontanous Breathing and Patient connected to nasal cannula oxygen  Post-op Assessment: Report given to RN and Post -op Vital signs reviewed and stable  Post vital signs: Reviewed and stable  Last Vitals:  Vitals Value Taken Time  BP 72/36 02/06/22 1333  Temp 36.1 C 02/06/22 1330  Pulse 80 02/06/22 1333  Resp 19 02/06/22 1333  SpO2 97 % 02/06/22 1333  Vitals shown include unvalidated device data.  Last Pain:  Vitals:   02/06/22 1330  TempSrc: Temporal  PainSc:          Complications: No notable events documented.

## 2022-02-06 NOTE — Progress Notes (Signed)
  Progress Note   Patient: Rachel Huber:[id number] DOB: 02/04/1956 DOA: 02/04/2022     2 DOS: the patient was seen and examined on 02/06/2022     Assessment and Plan: * GI bleeding Source is hemorrhoidal bleeding.  Flexible sigmoidoscopy shows hemorrhoids.  Interventional radiology team to do a embolization of the hemorrhoids tomorrow  Acute blood loss anemia Received total of 4 units of packed red blood cells.  Received IV iron yesterday.  Hemoglobin 8.1 today  Cirrhosis of liver (HCC) Recent TIPS procedure.  Ammonia level 62.  On lactulose daily.  History of ascites with recent paracentesis.  HLD (hyperlipidemia) Continue Lipitor  Chronic kidney disease, stage 3b (HCC) Creatinine 1.6 yesterday.  We will recheck again tomorrow.  Type II diabetes mellitus with renal manifestations (HCC) Recent A1c 5.7.  Diet controlled.     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  No abdominal pain.  Has not seen any further bleeding since yesterday and had a bowel movement.  Came in with rectal bleeding.  Physical Exam: Vitals:   02/06/22 1350 02/06/22 1352 02/06/22 1354 02/06/22 1400  BP:  (!) 96/39 (!) 111/43 (!) 108/46  Pulse: 78 81 79 80  Resp: 15 18 18 18   Temp:      TempSrc:      SpO2: 99% 99% 100% 98%  Weight:      Height:       Physical Exam HENT:     Head: Normocephalic.     Mouth/Throat:     Pharynx: No oropharyngeal exudate.  Eyes:     General: Lids are normal.     Conjunctiva/sclera: Conjunctivae normal.  Cardiovascular:     Rate and Rhythm: Normal rate and regular rhythm.     Heart sounds: Normal heart sounds, S1 normal and S2 normal.  Pulmonary:     Breath sounds: No decreased breath sounds, wheezing, rhonchi or rales.  Abdominal:     Palpations: Abdomen is soft.     Tenderness: There is no abdominal tenderness.  Musculoskeletal:     Right lower leg: No  swelling.     Left lower leg: No swelling.  Skin:    General: Skin is warm.     Findings: No rash.  Neurological:     Mental Status: She is alert and oriented to person, place, and time.     Data Reviewed: Hemoglobin 8.1  Disposition: Status is: Inpatient Remains inpatient appropriate because: Patient going to have a hemorrhoid embolization tomorrow by interventional radiology. Planned Discharge Destination: Home   Author: Loletha Grayer, MD 02/06/2022 2:50 PM  For on call review [email or web address].

## 2022-02-06 NOTE — Care Plan (Signed)
Flex sig with no blood in rectosigmoid area but hemorrhoids are grade III to IV, predominantly grade IV as they are prolapsed. Given size and comorbidities, banding would be relatively contraindicated and surgery would be high risk so IR embolization would be the safest at this time. Appreciate IR assistance with this case. TIPS is patent but narrowed which is being followed with by team who placed original TIPS. Will continue to follow. Can advance diet. Please call with any questions or concerns.  Raylene Miyamoto MD, MPH [place]

## 2022-02-06 NOTE — Progress Notes (Signed)
Patient back from [place], tap water enema given, patient tolerated well. Endoscopy notified of enema given, waiting for transport.

## 2022-02-06 NOTE — Telephone Encounter (Signed)
Called patient regarding missed appointment; she is currently in the hospital; had to have a procedure today as well as several units of blood over the past few days.  Patient will call after she is discharged from the hospital to set up missed appointments.  5:12 PM, 02/06/22 Remedios Mckone Small Clemma Johnsen MPT [facility] [place] [phone]

## 2022-02-07 ENCOUNTER — Encounter: Payer: Self-pay | Admitting: Gastroenterology

## 2022-02-07 ENCOUNTER — Inpatient Hospital Stay: Payer: 59 | Admitting: Radiology

## 2022-02-07 DIAGNOSIS — D62 Acute posthemorrhagic anemia: Secondary | ICD-10-CM | POA: Diagnosis not present

## 2022-02-07 DIAGNOSIS — D631 Anemia in chronic kidney disease: Secondary | ICD-10-CM

## 2022-02-07 DIAGNOSIS — K625 Hemorrhage of anus and rectum: Secondary | ICD-10-CM | POA: Diagnosis not present

## 2022-02-07 DIAGNOSIS — N1832 Chronic kidney disease, stage 3b: Secondary | ICD-10-CM | POA: Diagnosis not present

## 2022-02-07 DIAGNOSIS — D61818 Other pancytopenia: Secondary | ICD-10-CM

## 2022-02-07 DIAGNOSIS — K7460 Unspecified cirrhosis of liver: Secondary | ICD-10-CM | POA: Diagnosis not present

## 2022-02-07 HISTORY — PX: IR ANGIOGRAM SELECTIVE EACH ADDITIONAL VESSEL: IMG667

## 2022-02-07 HISTORY — PX: IR EMBO ARTERIAL NOT HEMORR HEMANG INC GUIDE ROADMAPPING: IMG5448

## 2022-02-07 HISTORY — DX: Anemia in chronic kidney disease: D63.1

## 2022-02-07 HISTORY — PX: IR US GUIDE VASC ACCESS RIGHT: IMG2390

## 2022-02-07 HISTORY — PX: IR ANGIOGRAM VISCERAL SELECTIVE: IMG657

## 2022-02-07 LAB — HEPATIC FUNCTION PANEL
ALT: 23 U/L (ref 0–44)
AST: 36 U/L (ref 15–41)
Albumin: 2.2 g/dL — ABNORMAL LOW (ref 3.5–5.0)
Alkaline Phosphatase: 83 U/L (ref 38–126)
Bilirubin, Direct: 0.4 mg/dL — ABNORMAL HIGH (ref 0.0–0.2)
Indirect Bilirubin: 1.2 mg/dL — ABNORMAL HIGH (ref 0.3–0.9)
Total Bilirubin: 1.6 mg/dL — ABNORMAL HIGH (ref 0.3–1.2)
Total Protein: 4.3 g/dL — ABNORMAL LOW (ref 6.5–8.1)

## 2022-02-07 LAB — CBC
HCT: 23.5 % — ABNORMAL LOW (ref 36.0–46.0)
Hemoglobin: 7.6 g/dL — ABNORMAL LOW (ref 12.0–15.0)
MCH: 25 pg — ABNORMAL LOW (ref 26.0–34.0)
MCHC: 32.3 g/dL (ref 30.0–36.0)
MCV: 77.3 fL — ABNORMAL LOW (ref 80.0–100.0)
Platelets: 128 10*3/uL — ABNORMAL LOW (ref 150–400)
RBC: 3.04 MIL/uL — ABNORMAL LOW (ref 3.87–5.11)
RDW: 24.7 % — ABNORMAL HIGH (ref 11.5–15.5)
WBC: 3.6 10*3/uL — ABNORMAL LOW (ref 4.0–10.5)
nRBC: 0.6 % — ABNORMAL HIGH (ref 0.0–0.2)

## 2022-02-07 LAB — TECHNOLOGIST SMEAR REVIEW: Plt Morphology: NORMAL

## 2022-02-07 LAB — BASIC METABOLIC PANEL
Anion gap: 0 — ABNORMAL LOW (ref 5–15)
BUN: 32 mg/dL — ABNORMAL HIGH (ref 8–23)
CO2: 21 mmol/L — ABNORMAL LOW (ref 22–32)
Calcium: 8.3 mg/dL — ABNORMAL LOW (ref 8.9–10.3)
Chloride: 120 mmol/L — ABNORMAL HIGH (ref 98–111)
Creatinine, Ser: 1.53 mg/dL — ABNORMAL HIGH (ref 0.44–1.00)
GFR, Estimated: 38 mL/min — ABNORMAL LOW (ref >60)
Glucose, Bld: 162 mg/dL — ABNORMAL HIGH (ref 70–99)
Potassium: 4.2 mmol/L (ref 3.5–5.1)
Sodium: 141 mmol/L (ref 135–145)

## 2022-02-07 MED ORDER — IOHEXOL 350 MG/ML SOLN
75.0000 mL | Freq: Once | INTRAVENOUS | Status: AC | PRN
  Administered 2022-02-07: 75 mL via INTRAVENOUS

## 2022-02-07 MED ORDER — ACETAMINOPHEN 325 MG PO TABS
ORAL_TABLET | ORAL | Status: AC
  Administered 2022-02-07: 650 mg via ORAL
  Filled 2022-02-07: qty 2

## 2022-02-07 MED ORDER — FENTANYL CITRATE (PF) 100 MCG/2ML IJ SOLN
INTRAMUSCULAR | Status: AC | PRN
  Administered 2022-02-07: 25 ug via INTRAVENOUS
  Administered 2022-02-07: 50 ug via INTRAVENOUS

## 2022-02-07 MED ORDER — GUAIFENESIN-DM 100-10 MG/5ML PO SYRP
5.0000 mL | ORAL_SOLUTION | Freq: Once | ORAL | Status: AC
  Administered 2022-02-07: 5 mL via ORAL
  Filled 2022-02-07: qty 10

## 2022-02-07 MED ORDER — MIDAZOLAM HCL 5 MG/5ML IJ SOLN
INTRAMUSCULAR | Status: AC | PRN
  Administered 2022-02-07: 1 mg via INTRAVENOUS

## 2022-02-07 MED ORDER — MIDAZOLAM HCL 2 MG/2ML IJ SOLN
INTRAMUSCULAR | Status: AC | PRN
  Administered 2022-02-07: .5 mg via INTRAVENOUS

## 2022-02-07 MED ORDER — LIDOCAINE-EPINEPHRINE 1 %-1:100000 IJ SOLN
INTRAMUSCULAR | Status: AC
  Administered 2022-02-07: 3 mL
  Filled 2022-02-07: qty 1

## 2022-02-07 MED ORDER — MIDAZOLAM HCL 2 MG/2ML IJ SOLN
INTRAMUSCULAR | Status: AC
  Filled 2022-02-07: qty 2

## 2022-02-07 MED ORDER — FENTANYL CITRATE (PF) 100 MCG/2ML IJ SOLN
INTRAMUSCULAR | Status: AC
  Filled 2022-02-07: qty 2

## 2022-02-07 NOTE — TOC Initial Note (Signed)
Transition of Care [facility]) - Initial/Assessment Note    Patient Details  Name: Rachel Huber MRN: [id number] Date of Birth: 12/07/1955  [facility]) CM/SW Contact:    Gildardo Griffes, LCSW Phone Number: 02/07/2022, 10:24 AM  Clinical Narrative:                  CSW met with patient at bedside to complete readmission risk assessment. Patient reports she has home support, has transport at time of discharge and has no issues getting her medications or getting to and from appointments. Patient reports she uses the pharmacy at [facility], has a walker and cane at home, PCP is Dr. Einar Crow.   Patient identifies no needs at discharge.    Expected Discharge Plan: Home/Self Care Barriers to Discharge: Continued Medical Work up   Patient Goals and CMS Choice Patient states their goals for this hospitalization and ongoing recovery are:: to go home CMS Medicare.gov Compare Post Acute Care list provided to:: Patient Choice offered to / list presented to : Patient  Expected Discharge Plan and Services Expected Discharge Plan: Home/Self Care       Living arrangements for the past 2 months: Single Family Home                                      Prior Living Arrangements/Services Living arrangements for the past 2 months: Single Family Home                     Activities of Daily Living Home Assistive Devices/Equipment: Cane (specify quad or straight), Walker (specify type), Eyeglasses ADL Screening (condition at time of admission) Patient's cognitive ability adequate to safely complete daily activities?: Yes Is the patient deaf or have difficulty hearing?: No Does the patient have difficulty seeing, even when wearing glasses/contacts?: No Does the patient have difficulty concentrating, remembering, or making decisions?: No Patient able to express need for assistance with ADLs?: Yes Does the patient have difficulty dressing or bathing?:  No Independently performs ADLs?: Yes (appropriate for developmental age) Does the patient have difficulty walking or climbing stairs?: Yes Weakness of Legs: Both Weakness of Arms/Hands: Both  Permission Sought/Granted                  Emotional Assessment Appearance:: Appears stated age     Orientation: : Oriented to Self, Oriented to Place, Oriented to  Time, Oriented to Situation Alcohol / Substance Use: Not Applicable Psych Involvement: No (comment)  Admission diagnosis:  GI bleeding [K92.2] Symptomatic anemia [D64.9] Cirrhosis of liver with ascites, unspecified hepatic cirrhosis type (HCC) [K74.60, R18.8] Gastrointestinal hemorrhage, unspecified gastrointestinal hemorrhage type [K92.2] Patient Active Problem List   Diagnosis Date Noted   GI bleeding 02/04/2022   Chronic kidney disease, stage 3b (HCC) 02/04/2022   Type II diabetes mellitus with renal manifestations (HCC) 02/04/2022   Depression with anxiety 02/04/2022   Cirrhosis of liver (HCC)    Acute GI hemorrhage 01/28/2022   AKI (acute kidney injury) (HCC) 01/27/2022   Insomnia 01/27/2022   Iron deficiency anemia due to chronic blood loss 02/13/2021   History of uterine cancer 02/13/2021   Decompensated hepatic cirrhosis (HCC) 10/25/2020   OSA (obstructive sleep apnea) 10/25/2020   Glossitis 10/25/2020   Grade IV internal hemorrhoids    Acute blood loss anemia 10/08/2020   Hyponatremia 10/08/2020   Rectal bleeding 10/08/2020  Acute respiratory failure with hypoxia (HCC) 09/10/2020   Pneumonia due to COVID-19 virus 09/08/2020   Hip fracture, right (HCC) 09/04/2020   Closed right hip fracture (HCC) 09/02/2020   Diabetes mellitus, controlled (HCC) 09/02/2020   Thalassemia minor    HLD (hyperlipidemia)    Acute renal failure superimposed on stage 3a chronic kidney disease (HCC)    Depression    Fall at home, initial encounter    Nondisplaced fracture of greater trochanter of right femur, initial encounter  for closed fracture [facility])    PCP:  Lauro Regulus, MD Pharmacy:   [facility] [address] [place] - [address] [place] [address] Phone: [phone] Fax: [phone]     Social Determinants of Health (SDOH) Interventions    Readmission Risk Interventions     No data to display

## 2022-02-08 ENCOUNTER — Inpatient Hospital Stay: Payer: 59

## 2022-02-08 DIAGNOSIS — K7460 Unspecified cirrhosis of liver: Secondary | ICD-10-CM | POA: Diagnosis not present

## 2022-02-08 DIAGNOSIS — D62 Acute posthemorrhagic anemia: Secondary | ICD-10-CM | POA: Diagnosis not present

## 2022-02-08 DIAGNOSIS — R509 Fever, unspecified: Secondary | ICD-10-CM | POA: Diagnosis not present

## 2022-02-08 DIAGNOSIS — K625 Hemorrhage of anus and rectum: Secondary | ICD-10-CM | POA: Diagnosis not present

## 2022-02-08 LAB — GLUCOSE, CAPILLARY
Glucose-Capillary: 157 mg/dL — ABNORMAL HIGH (ref 70–99)
Glucose-Capillary: 193 mg/dL — ABNORMAL HIGH (ref 70–99)
Glucose-Capillary: 154 mg/dL — ABNORMAL HIGH (ref 70–99)
Glucose-Capillary: 164 mg/dL — ABNORMAL HIGH (ref 70–99)

## 2022-02-08 LAB — BASIC METABOLIC PANEL
Anion gap: 5 (ref 5–15)
BUN: 36 mg/dL — ABNORMAL HIGH (ref 8–23)
CO2: 18 mmol/L — ABNORMAL LOW (ref 22–32)
Calcium: 7.8 mg/dL — ABNORMAL LOW (ref 8.9–10.3)
Chloride: 112 mmol/L — ABNORMAL HIGH (ref 98–111)
Creatinine, Ser: 1.41 mg/dL — ABNORMAL HIGH (ref 0.44–1.00)
GFR, Estimated: 42 mL/min — ABNORMAL LOW (ref >60)
Glucose, Bld: 152 mg/dL — ABNORMAL HIGH (ref 70–99)
Potassium: 4 mmol/L (ref 3.5–5.1)
Sodium: 135 mmol/L (ref 135–145)

## 2022-02-08 LAB — CBC
HCT: 23.2 % — ABNORMAL LOW (ref 36.0–46.0)
Hemoglobin: 7.5 g/dL — ABNORMAL LOW (ref 12.0–15.0)
MCH: 24.9 pg — ABNORMAL LOW (ref 26.0–34.0)
MCHC: 32.3 g/dL (ref 30.0–36.0)
MCV: 77.1 fL — ABNORMAL LOW (ref 80.0–100.0)
Platelets: 132 10*3/uL — ABNORMAL LOW (ref 150–400)
RBC: 3.01 MIL/uL — ABNORMAL LOW (ref 3.87–5.11)
RDW: 24.7 % — ABNORMAL HIGH (ref 11.5–15.5)
WBC: 3.4 10*3/uL — ABNORMAL LOW (ref 4.0–10.5)
nRBC: 0.6 % — ABNORMAL HIGH (ref 0.0–0.2)

## 2022-02-08 LAB — AMMONIA: Ammonia: 41 umol/L — ABNORMAL HIGH (ref 9–35)

## 2022-02-08 LAB — PREPARE RBC (CROSSMATCH)

## 2022-02-08 MED ORDER — PANTOPRAZOLE SODIUM 40 MG PO TBEC: 40.0000 mg | DELAYED_RELEASE_TABLET | Freq: Two times a day (BID) | ORAL | Status: DC

## 2022-02-08 MED ORDER — ACETAMINOPHEN 325 MG PO TABS
650.0000 mg | ORAL_TABLET | Freq: Once | ORAL | Status: AC
  Administered 2022-02-08: 650 mg via ORAL
  Filled 2022-02-08: qty 2

## 2022-02-08 MED ORDER — DOXYCYCLINE HYCLATE 100 MG PO TABS
100.0000 mg | ORAL_TABLET | Freq: Two times a day (BID) | ORAL | Status: DC
Start: 2022-02-08 — End: 2022-02-10
  Administered 2022-02-08 – 2022-02-10 (×4): 100 mg via ORAL
  Filled 2022-02-08 (×5): qty 1

## 2022-02-08 MED ORDER — SODIUM CHLORIDE 0.9% IV SOLUTION: Freq: Once | INTRAVENOUS | Status: AC

## 2022-02-08 MED ORDER — SODIUM CHLORIDE 0.9 % IV SOLN
2.0000 g | INTRAVENOUS | Status: DC
  Administered 2022-02-08 – 2022-02-09 (×2): 2 g via INTRAVENOUS
  Filled 2022-02-08 (×3): qty 20

## 2022-02-08 MED ORDER — GUAIFENESIN-DM 100-10 MG/5ML PO SYRP
5.0000 mL | ORAL_SOLUTION | ORAL | Status: DC | PRN
  Administered 2022-02-08 – 2022-02-09 (×7): 5 mL via ORAL
  Filled 2022-02-08 (×7): qty 10

## 2022-02-08 MED ORDER — FUROSEMIDE 10 MG/ML IJ SOLN
80.0000 mg | Freq: Once | INTRAMUSCULAR | Status: AC
Start: 2022-02-08 — End: 2022-02-08
  Administered 2022-02-08: 80 mg via INTRAVENOUS
  Filled 2022-02-08: qty 8

## 2022-02-09 DIAGNOSIS — K7460 Unspecified cirrhosis of liver: Secondary | ICD-10-CM | POA: Diagnosis not present

## 2022-02-09 DIAGNOSIS — D62 Acute posthemorrhagic anemia: Secondary | ICD-10-CM | POA: Diagnosis not present

## 2022-02-09 DIAGNOSIS — K625 Hemorrhage of anus and rectum: Secondary | ICD-10-CM | POA: Diagnosis not present

## 2022-02-09 DIAGNOSIS — R509 Fever, unspecified: Secondary | ICD-10-CM | POA: Diagnosis not present

## 2022-02-09 LAB — BPAM RBC
Blood Product Expiration Date: 202307142359
ISSUE DATE / TIME: 202306241402
Unit Type and Rh: 6200

## 2022-02-09 LAB — CBC WITH DIFFERENTIAL/PLATELET
Abs Immature Granulocytes: 0.02 10*3/uL (ref 0.00–0.07)
Basophils Absolute: 0 10*3/uL (ref 0.0–0.1)
Basophils Relative: 0 %
Eosinophils Absolute: 0.2 10*3/uL (ref 0.0–0.5)
Eosinophils Relative: 4 %
HCT: 26.1 % — ABNORMAL LOW (ref 36.0–46.0)
Hemoglobin: 8.3 g/dL — ABNORMAL LOW (ref 12.0–15.0)
Immature Granulocytes: 1 %
Lymphocytes Relative: 24 %
Lymphs Abs: 1 10*3/uL (ref 0.7–4.0)
MCH: 25.1 pg — ABNORMAL LOW (ref 26.0–34.0)
MCHC: 31.8 g/dL (ref 30.0–36.0)
MCV: 78.9 fL — ABNORMAL LOW (ref 80.0–100.0)
Monocytes Absolute: 0.5 10*3/uL (ref 0.1–1.0)
Monocytes Relative: 13 %
Neutro Abs: 2.4 10*3/uL (ref 1.7–7.7)
Neutrophils Relative %: 58 %
Platelets: 104 10*3/uL — ABNORMAL LOW (ref 150–400)
RBC: 3.31 MIL/uL — ABNORMAL LOW (ref 3.87–5.11)
RDW: 24 % — ABNORMAL HIGH (ref 11.5–15.5)
Smear Review: DECREASED
WBC: 4 10*3/uL (ref 4.0–10.5)
nRBC: 0 % (ref 0.0–0.2)

## 2022-02-09 LAB — LACTATE DEHYDROGENASE: LDH: 247 U/L — ABNORMAL HIGH (ref 98–192)

## 2022-02-09 LAB — GLUCOSE, CAPILLARY
Glucose-Capillary: 129 mg/dL — ABNORMAL HIGH (ref 70–99)
Glucose-Capillary: 133 mg/dL — ABNORMAL HIGH (ref 70–99)
Glucose-Capillary: 174 mg/dL — ABNORMAL HIGH (ref 70–99)
Glucose-Capillary: 174 mg/dL — ABNORMAL HIGH (ref 70–99)

## 2022-02-09 LAB — TYPE AND SCREEN
ABO/RH(D): A POS
Antibody Screen: NEGATIVE
Unit division: 0

## 2022-02-09 LAB — DAT, POLYSPECIFIC AHG (ARMC ONLY): Polyspecific AHG test: NEGATIVE

## 2022-02-09 MED ORDER — SALINE SPRAY 0.65 % NA SOLN
1.0000 | NASAL | Status: DC | PRN
Start: 2022-02-09 — End: 2022-02-10
  Filled 2022-02-09: qty 44

## 2022-02-09 MED ORDER — FUROSEMIDE 10 MG/ML IJ SOLN
40.0000 mg | Freq: Once | INTRAMUSCULAR | Status: AC
  Administered 2022-02-09: 40 mg via INTRAVENOUS
  Filled 2022-02-09: qty 4

## 2022-02-09 NOTE — Progress Notes (Signed)
  Progress Note   Patient: Rachel Huber ZOX:[id number] DOB: 08-19-1955 DOA: 02/04/2022     5 DOS: the patient was seen and examined on 02/09/2022     Assessment and Plan: * Fever Unclear source.  Could be postoperative fever.  Empirically start Rocephin and doxycycline.  Blood cultures negative for 12 hours.  Chest x-ray negative but complains of dry cough.  If has fever today or tomorrow morning will consider paracentesis.  GI bleeding Source is hemorrhoidal bleeding.  Flexible sigmoidoscopy shows hemorrhoids.  Interventional radiology did an embolization of hemorrhoids on 02/07/2022.  Acute blood loss anemia Received total of 4.5 units of packed red blood cells.  Received IV iron.  Hemoglobin 8.3 today.  Okay with blood transfusion (since fever was before blood transfusion).  LDH slightly high.  Haptoglobin pending.  Cirrhosis of liver (HCC) Recent TIPS procedure.  Ammonia level 41.  On lactulose daily.  History of ascites with recent paracentesis.  May need to get another paracentesis on Monday  HLD (hyperlipidemia) Continue Lipitor  Chronic kidney disease, stage 3b (HCC) Creatinine 1.41  Type II diabetes mellitus with renal manifestations (HCC) Recent A1c 5.7.  Diet controlled.     Thalassemia minor Continue to monitor hemoglobin  Depression with anxiety Continue Zoloft  Pancytopenia (HCC) White blood cell count 4.0, platelet count 104 and hemoglobin 8.3.        Subjective: Patient feels weak.  Had a fever above 101 yesterday.  Only received half of the blood transfusion yesterday.  Still with a dry cough.  Physical Exam: Vitals:   02/09/22 0616 02/09/22 0905 02/09/22 1104 02/09/22 1603  BP:  (!) 115/48 (!) 119/47 (!) 129/54  Pulse:  85 82 89  Resp:  18 16 18   Temp:  98 F (36.7 C) 98.6 F (37 C) 98.7 F (37.1 C)  TempSrc:  Oral Oral Oral  SpO2:  95% 97% 96%  Weight: 122.6 kg     Height:       Physical Exam HENT:     Head: Normocephalic.      Mouth/Throat:     Pharynx: No oropharyngeal exudate.  Eyes:     General: Lids are normal.     Conjunctiva/sclera: Conjunctivae normal.  Cardiovascular:     Rate and Rhythm: Normal rate and regular rhythm.     Heart sounds: Normal heart sounds, S1 normal and S2 normal.  Pulmonary:     Breath sounds: Examination of the right-lower field reveals decreased breath sounds. Examination of the left-lower field reveals decreased breath sounds. Decreased breath sounds present. No wheezing, rhonchi or rales.  Abdominal:     Palpations: Abdomen is soft.     Tenderness: There is no abdominal tenderness.  Musculoskeletal:     Right lower leg: Swelling present.     Left lower leg: Swelling present.  Skin:    General: Skin is warm.     Findings: No rash.  Neurological:     Mental Status: She is alert and oriented to person, place, and time.     Data Reviewed: Blood cultures negative for 12 hours, hemoglobin 8.3, platelet count 104.  White blood cell count 4.   Disposition: Status is: Inpatient Remains inpatient appropriate because: Monitor for signs of fever today.  If no fever today may be able to get out of the hospital tomorrow.  Planned Discharge Destination: Home   Author: Alford Highland, MD 02/09/2022 4:24 PM  For on call review [email or web address].

## 2022-02-10 DIAGNOSIS — N179 Acute kidney failure, unspecified: Secondary | ICD-10-CM | POA: Diagnosis not present

## 2022-02-10 DIAGNOSIS — N1832 Chronic kidney disease, stage 3b: Secondary | ICD-10-CM | POA: Diagnosis not present

## 2022-02-10 DIAGNOSIS — N189 Chronic kidney disease, unspecified: Secondary | ICD-10-CM | POA: Diagnosis not present

## 2022-02-10 DIAGNOSIS — D563 Thalassemia minor: Secondary | ICD-10-CM | POA: Diagnosis not present

## 2022-02-10 DIAGNOSIS — K922 Gastrointestinal hemorrhage, unspecified: Secondary | ICD-10-CM | POA: Diagnosis not present

## 2022-02-10 DIAGNOSIS — D61818 Other pancytopenia: Secondary | ICD-10-CM | POA: Diagnosis not present

## 2022-02-10 DIAGNOSIS — K7460 Unspecified cirrhosis of liver: Secondary | ICD-10-CM | POA: Diagnosis not present

## 2022-02-10 DIAGNOSIS — E785 Hyperlipidemia, unspecified: Secondary | ICD-10-CM | POA: Diagnosis not present

## 2022-02-10 DIAGNOSIS — E1122 Type 2 diabetes mellitus with diabetic chronic kidney disease: Secondary | ICD-10-CM | POA: Diagnosis not present

## 2022-02-10 DIAGNOSIS — R188 Other ascites: Secondary | ICD-10-CM | POA: Diagnosis not present

## 2022-02-10 DIAGNOSIS — D62 Acute posthemorrhagic anemia: Secondary | ICD-10-CM | POA: Diagnosis not present

## 2022-02-10 DIAGNOSIS — R509 Fever, unspecified: Secondary | ICD-10-CM | POA: Diagnosis not present

## 2022-02-10 LAB — GLUCOSE, CAPILLARY
Glucose-Capillary: 111 mg/dL — ABNORMAL HIGH (ref 70–99)
Glucose-Capillary: 138 mg/dL — ABNORMAL HIGH (ref 70–99)

## 2022-02-10 LAB — COMPREHENSIVE METABOLIC PANEL
ALT: 24 U/L (ref 0–44)
AST: 47 U/L — ABNORMAL HIGH (ref 15–41)
Albumin: 2.4 g/dL — ABNORMAL LOW (ref 3.5–5.0)
Alkaline Phosphatase: 99 U/L (ref 38–126)
Anion gap: 5 (ref 5–15)
BUN: 36 mg/dL — ABNORMAL HIGH (ref 8–23)
CO2: 21 mmol/L — ABNORMAL LOW (ref 22–32)
Calcium: 8 mg/dL — ABNORMAL LOW (ref 8.9–10.3)
Chloride: 109 mmol/L (ref 98–111)
Creatinine, Ser: 1.37 mg/dL — ABNORMAL HIGH (ref 0.44–1.00)
GFR, Estimated: 43 mL/min — ABNORMAL LOW (ref >60)
Glucose, Bld: 125 mg/dL — ABNORMAL HIGH (ref 70–99)
Potassium: 3 mmol/L — ABNORMAL LOW (ref 3.5–5.1)
Sodium: 135 mmol/L (ref 135–145)
Total Bilirubin: 1.4 mg/dL — ABNORMAL HIGH (ref 0.3–1.2)
Total Protein: 4.9 g/dL — ABNORMAL LOW (ref 6.5–8.1)

## 2022-02-10 LAB — CBC
HCT: 28.2 % — ABNORMAL LOW (ref 36.0–46.0)
Hemoglobin: 8.9 g/dL — ABNORMAL LOW (ref 12.0–15.0)
MCH: 24.7 pg — ABNORMAL LOW (ref 26.0–34.0)
MCHC: 31.6 g/dL (ref 30.0–36.0)
MCV: 78.3 fL — ABNORMAL LOW (ref 80.0–100.0)
Platelets: 132 10*3/uL — ABNORMAL LOW (ref 150–400)
RBC: 3.6 MIL/uL — ABNORMAL LOW (ref 3.87–5.11)
RDW: 23.9 % — ABNORMAL HIGH (ref 11.5–15.5)
WBC: 4.6 10*3/uL (ref 4.0–10.5)
nRBC: 0.4 % — ABNORMAL HIGH (ref 0.0–0.2)

## 2022-02-10 LAB — HAPTOGLOBIN: Haptoglobin: <10 mg/dL — ABNORMAL LOW (ref 37–355)

## 2022-02-10 MED ORDER — AMOXICILLIN-POT CLAVULANATE 875-125 MG PO TABS
1.0000 | ORAL_TABLET | Freq: Two times a day (BID) | ORAL | Status: DC
  Administered 2022-02-10: 1 via ORAL
  Filled 2022-02-10: qty 1

## 2022-02-10 MED ORDER — DOXYCYCLINE HYCLATE 100 MG PO TABS
100.0000 mg | ORAL_TABLET | Freq: Two times a day (BID) | ORAL | 0 refills | Status: AC
Start: 2022-02-10 — End: 2022-02-15

## 2022-02-10 MED ORDER — SALINE SPRAY 0.65 % NA SOLN: 1.0000 | NASAL | 0 refills | Status: DC | PRN

## 2022-02-10 MED ORDER — POTASSIUM CHLORIDE CRYS ER 20 MEQ PO TBCR
40.0000 meq | EXTENDED_RELEASE_TABLET | Freq: Once | ORAL | Status: AC
  Administered 2022-02-10: 40 meq via ORAL
  Filled 2022-02-10: qty 2

## 2022-02-10 MED ORDER — AMOXICILLIN-POT CLAVULANATE 875-125 MG PO TABS
1.0000 | ORAL_TABLET | Freq: Two times a day (BID) | ORAL | 0 refills | Status: AC
Start: 2022-02-10 — End: 2022-02-13

## 2022-02-10 MED ORDER — ALBUTEROL SULFATE HFA 108 (90 BASE) MCG/ACT IN AERS: 2.0000 | INHALATION_SPRAY | Freq: Four times a day (QID) | RESPIRATORY_TRACT | 0 refills | Status: DC | PRN

## 2022-02-10 NOTE — Progress Notes (Signed)
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  S/p Successful particle and coil embolization of the bilateral superior rectal arteries via RCFA on 6/23  Subjective: Patient denies any right groin access site pain, denies any RLE changes or complaints. She is excited to be going home today and complains of sinus headache with congestion and drainage and dry cough from sinuses. She denies any gross bloody BM since the procedure.   Allergies: Latex  Medications: Prior to Admission medications   Medication Sig Start Date End Date Taking? Authorizing Provider  albuterol (VENTOLIN HFA) 108 (90 Base) MCG/ACT inhaler Inhale 2 puffs into the lungs every 6 (six) hours as needed for wheezing or shortness of breath. 02/10/22  Yes Wieting, Richard, MD  atorvastatin (LIPITOR) 10 MG tablet Take 10 mg by mouth daily. 01/28/21  Yes [provider]  Cholecalciferol 50 MCG (2000 UT) CAPS Take by mouth.   Yes [provider]  diphenhydrAMINE (BENADRYL) 25 MG tablet Take 25 mg by mouth 2 (two) times daily as needed for itching.   Yes [provider]  gabapentin (NEURONTIN) 400 MG capsule Take 800 mg by mouth daily at 2 am. 01/07/18  Yes [provider]  hydrocortisone (ANUSOL-HC) 25 MG suppository Place 1 suppository (25 mg total) rectally 2 (two) times daily. 01/30/22  Yes Emokpae, Courage, MD  lactulose, encephalopathy, (CHRONULAC) 10 GM/15ML SOLN Take 30 mLs (20 g total) by mouth daily. 01/30/22  Yes Emokpae, Courage, MD  melatonin 5 MG TABS Take 10 mg by mouth at bedtime as needed (sleep).   Yes [provider]  Multiple Vitamins-Minerals (MULTIVITAMIN WITH MINERALS)  tablet Take 1 tablet by mouth daily.   Yes [provider]  pantoprazole (PROTONIX) 40 MG tablet Take 1 tablet (40 mg total) by mouth daily. 01/30/22 01/30/23 Yes Emokpae, Courage, MD  sertraline (ZOLOFT) 50 MG tablet Take 50 mg by mouth daily. 12/12/17  Yes [provider]  vitamin B-12 (CYANOCOBALAMIN) 1000 MCG tablet Take 1,000 mcg by mouth daily.   Yes [provider]  acetaminophen (TYLENOL) 650 MG CR tablet Take 1,300 mg by mouth daily as needed for pain.    [provider]  amoxicillin-clavulanate (AUGMENTIN) 875-125 MG tablet Take 1 tablet by mouth every 12 (twelve) hours for 3 days. 02/10/22 02/13/22  Alford Highland, MD  doxycycline (VIBRA-TABS) 100 MG tablet Take 1 tablet (100 mg total) by mouth every 12 (twelve) hours for 5 days. 02/10/22 02/15/22  Alford Highland, MD  guaiFENesin-dextromethorphan (ROBITUSSIN DM) 100-10 MG/5ML syrup Take 5 mLs by mouth every 4 (four) hours as needed for cough. 09/13/20   Glade Lloyd, MD  ondansetron (ZOFRAN-ODT) 4 MG disintegrating tablet Take 1 tablet (4 mg total) by mouth every 8 (eight) hours as needed for nausea or vomiting. 01/30/22   Shon Hale, MD  oxymetazoline (AFRIN) 0.05 % nasal spray Place 1 spray into both nostrils 2 (two) times daily as needed.    [provider]  sodium chloride (OCEAN) 0.65 % SOLN nasal spray Place 1 spray into both nostrils as needed for congestion. 02/10/22   Alford Highland, MD   Vital Signs: BP (!) 128/52 (BP Location: Right Arm)   Pulse 80   Temp 98.1 F (36.7 C)  Resp 14   Ht 5\' 9"  (1.753 m)   Wt 268 lb 8.3 oz (121.8 kg) Comment: 123.6-1.8  SpO2 96%   BMI 39.65 kg/m   Physical Exam  General: A&O, NAD, sitting up in bed Abd: Soft, distended Ext: RCFA access site dressing C/D/I, femoral pulse palpable, DP 2+ palpable, warm, sensation and strength intact  Imaging: IR EMBO ARTERIAL NOT HEMORR HEMANG INC GUIDE ROADMAPPING  Result Date: 02/10/2022 INDICATION:  66 year old female with history of NASH cirrhosis and portal hypertension status post TIPS creation with recurrent hemorrhagic internal hemorrhoids (grade 4). Poor surgical candidate. EXAM: 1. Ultrasound-guided vascular access of the right common femoral artery. 2. Selective catheterization angiography of the inferior mesenteric artery and bilateral superior rectal arteries. 3. Particle and coil embolization of the bilateral superior rectal arteries. MEDICATIONS: None. ANESTHESIA/SEDATION: Moderate (conscious) sedation was employed during this procedure. A total of Versed 1.5 mg and Fentanyl 75 mcg was administered intravenously. Moderate Sedation Time: 93 minutes. The patient's level of consciousness and vital signs were monitored continuously by radiology nursing throughout the procedure under my direct supervision. CONTRAST:  75mL OMNIPAQUE IOHEXOL 350 MG/ML SOLN FLUOROSCOPY: Radiation Exposure Index (as provided by the fluoroscopic device): 529.4 mGy Kerma COMPLICATIONS: None immediate. PROCEDURE: Informed consent was obtained from the patient following explanation of the procedure, risks, benefits and alternatives. The patient understands, agrees and consents for the procedure. All questions were addressed. A time out was performed prior to the initiation of the procedure. Maximal barrier sterile technique utilized including caps, mask, sterile gowns, sterile gloves, large sterile drape, hand hygiene, and Betadine prep. The right groin was prepped and draped in standard fashion. Preprocedure ultrasound evaluation demonstrated patency of the right common femoral artery. The procedure was planned. Subdermal Local anesthesia was administered with 1% lidocaine at the planned needle entry site. A small dermatotomy was created with an 11 blade. Under direct ultrasound visualization, the right common femoral artery was accessed with a 21 gauge micropuncture needle. A permanent image was captured and stored the record.  Micropuncture sheath was introduced and a limited right lower extremity angiogram was performed which demonstrated adequate puncture site for closure device use. A 0.035 inch J wire was then inserted and directed to the abdominal aorta under fluoroscopic guidance. The micropuncture sheath was exchanged for a 5 [place], 10 cm vascular sheath. A rim catheter was then introduced and attempts at selecting the inferior mesenteric artery were made which were unsuccessful. Therefore, the rim catheter was exchanged for a Mickelson catheter which successfully engaged the ostium of the inferior mesenteric artery. Inferior mesenteric angiogram demonstrated widely patent inferior mesenteric artery with hypertrophied bilateral superior rectal arteries which are tortuous distally where there was mild blushing compatible with known internal hemorrhoids. A 2.4 [place] Progreat and 0.04 inch fathom microwire were then used to cannulate the right superior rectal artery. Superior rectal angiogram was performed which again demonstrated hypertrophy and tortuosity in the region of the right-sided internal hemorrhoids. Particle embolization was performed with 400 micron Hydrospheres until near stasis was achieved. This was followed by coil embolization with 3 mm and 2 mm 0.018" Azur microcoils. Repeat superior rectal angiogram demonstrated successful embolization. The microcatheter and wire were then retracted and used to select the left superior rectal artery. Distal left superior rectal angiogram was performed which demonstrated hypertrophy and tortuosity in the region of the left internal hemorrhoids. Particle embolization was performed with 400 micron Hydrospheres until near stasis was achieved. This was followed by coil embolization with 3 mm and  2 mm 0.018" Azur microcoils. Repeat left superior rectal angiogram demonstrated successful embolization. Repeat angiogram from the proximal superior rectal artery was then performed which  demonstrated middle rectal artery branches supplying the internal hemorrhoids., Therefore, proximal bilateral superior rectal artery was coil embolized with 3 mm and 4 mm Azur 0.018" microcoils. Completion superior rectal angiogram demonstrated successful embolization of the bilateral superior rectal and middle rectal arteries without evidence of opacification of the previously visualized internal hemorrhoids. The catheters were removed. The right common femoral artery was closed with a 6 French Angio-Seal device successfully. Peripheral pulses were unchanged. The patient tolerated the procedure well was transferred back to the floor in stable condition. IMPRESSION: Technically successful particle and coil embolization of the bilateral superior rectal arteries. Marliss Coots, MD [facility] Radiology Electronically Signed   By: Marliss Coots M.D.   On: 02/10/2022 09:18   IR US Guide Vasc Access Right  Result Date: 02/10/2022 INDICATION: 67 year old female with history of NASH cirrhosis and portal hypertension status post TIPS creation with recurrent hemorrhagic internal hemorrhoids (grade 4). Poor surgical candidate. EXAM: 1. Ultrasound-guided vascular access of the right common femoral artery. 2. Selective catheterization angiography of the inferior mesenteric artery and bilateral superior rectal arteries. 3. Particle and coil embolization of the bilateral superior rectal arteries. MEDICATIONS: None. ANESTHESIA/SEDATION: Moderate (conscious) sedation was employed during this procedure. A total of Versed 1.5 mg and Fentanyl 75 mcg was administered intravenously. Moderate Sedation Time: 93 minutes. The patient's level of consciousness and vital signs were monitored continuously by radiology nursing throughout the procedure under my direct supervision. CONTRAST:  75mL OMNIPAQUE IOHEXOL 350 MG/ML SOLN FLUOROSCOPY: Radiation Exposure Index (as provided by the  fluoroscopic device): 529.4 mGy Kerma COMPLICATIONS: None immediate. PROCEDURE: Informed consent was obtained from the patient following explanation of the procedure, risks, benefits and alternatives. The patient understands, agrees and consents for the procedure. All questions were addressed. A time out was performed prior to the initiation of the procedure. Maximal barrier sterile technique utilized including caps, mask, sterile gowns, sterile gloves, large sterile drape, hand hygiene, and Betadine prep. The right groin was prepped and draped in standard fashion. Preprocedure ultrasound evaluation demonstrated patency of the right common femoral artery. The procedure was planned. Subdermal Local anesthesia was administered with 1% lidocaine at the planned needle entry site. A small dermatotomy was created with an 11 blade. Under direct ultrasound visualization, the right common femoral artery was accessed with a 21 gauge micropuncture needle. A permanent image was captured and stored the record. Micropuncture sheath was introduced and a limited right lower extremity angiogram was performed which demonstrated adequate puncture site for closure device use. A 0.035 inch J wire was then inserted and directed to the abdominal aorta under fluoroscopic guidance. The micropuncture sheath was exchanged for a 5 [place], 10 cm vascular sheath. A rim catheter was then introduced and attempts at selecting the inferior mesenteric artery were made which were unsuccessful. Therefore, the rim catheter was exchanged for a Mickelson catheter which successfully engaged the ostium of the inferior mesenteric artery. Inferior mesenteric angiogram demonstrated widely patent inferior mesenteric artery with hypertrophied bilateral superior rectal arteries which are tortuous distally where there was mild blushing compatible with known internal hemorrhoids. A 2.4 [place] Progreat and 0.04 inch fathom microwire were then used to cannulate the  right superior rectal artery. Superior rectal angiogram was performed which again demonstrated hypertrophy and tortuosity in the region of the right-sided internal hemorrhoids. Particle embolization was performed  with 400 micron Hydrospheres until near stasis was achieved. This was followed by coil embolization with 3 mm and 2 mm 0.018" Azur microcoils. Repeat superior rectal angiogram demonstrated successful embolization. The microcatheter and wire were then retracted and used to select the left superior rectal artery. Distal left superior rectal angiogram was performed which demonstrated hypertrophy and tortuosity in the region of the left internal hemorrhoids. Particle embolization was performed with 400 micron Hydrospheres until near stasis was achieved. This was followed by coil embolization with 3 mm and 2 mm 0.018" Azur microcoils. Repeat left superior rectal angiogram demonstrated successful embolization. Repeat angiogram from the proximal superior rectal artery was then performed which demonstrated middle rectal artery branches supplying the internal hemorrhoids., Therefore, proximal bilateral superior rectal artery was coil embolized with 3 mm and 4 mm Azur 0.018" microcoils. Completion superior rectal angiogram demonstrated successful embolization of the bilateral superior rectal and middle rectal arteries without evidence of opacification of the previously visualized internal hemorrhoids. The catheters were removed. The right common femoral artery was closed with a 6 French Angio-Seal device successfully. Peripheral pulses were unchanged. The patient tolerated the procedure well was transferred back to the floor in stable condition. IMPRESSION: Technically successful particle and coil embolization of the bilateral superior rectal arteries. Marliss Coots, MD [facility] Radiology Electronically Signed   By: Marliss Coots M.D.   On: 02/10/2022 09:18   IR  Angiogram Visceral Selective  Result Date: 02/10/2022 INDICATION: 66 year old female with history of NASH cirrhosis and portal hypertension status post TIPS creation with recurrent hemorrhagic internal hemorrhoids (grade 4). Poor surgical candidate. EXAM: 1. Ultrasound-guided vascular access of the right common femoral artery. 2. Selective catheterization angiography of the inferior mesenteric artery and bilateral superior rectal arteries. 3. Particle and coil embolization of the bilateral superior rectal arteries. MEDICATIONS: None. ANESTHESIA/SEDATION: Moderate (conscious) sedation was employed during this procedure. A total of Versed 1.5 mg and Fentanyl 75 mcg was administered intravenously. Moderate Sedation Time: 93 minutes. The patient's level of consciousness and vital signs were monitored continuously by radiology nursing throughout the procedure under my direct supervision. CONTRAST:  75mL OMNIPAQUE IOHEXOL 350 MG/ML SOLN FLUOROSCOPY: Radiation Exposure Index (as provided by the fluoroscopic device): 529.4 mGy Kerma COMPLICATIONS: None immediate. PROCEDURE: Informed consent was obtained from the patient following explanation of the procedure, risks, benefits and alternatives. The patient understands, agrees and consents for the procedure. All questions were addressed. A time out was performed prior to the initiation of the procedure. Maximal barrier sterile technique utilized including caps, mask, sterile gowns, sterile gloves, large sterile drape, hand hygiene, and Betadine prep. The right groin was prepped and draped in standard fashion. Preprocedure ultrasound evaluation demonstrated patency of the right common femoral artery. The procedure was planned. Subdermal Local anesthesia was administered with 1% lidocaine at the planned needle entry site. A small dermatotomy was created with an 11 blade. Under direct ultrasound visualization, the right common femoral artery was accessed with a 21 gauge  micropuncture needle. A permanent image was captured and stored the record. Micropuncture sheath was introduced and a limited right lower extremity angiogram was performed which demonstrated adequate puncture site for closure device use. A 0.035 inch J wire was then inserted and directed to the abdominal aorta under fluoroscopic guidance. The micropuncture sheath was exchanged for a 5 [place], 10 cm vascular sheath. A rim catheter was then introduced and attempts at selecting the inferior mesenteric artery were made which were unsuccessful. Therefore, the rim catheter  was exchanged for a Mickelson catheter which successfully engaged the ostium of the inferior mesenteric artery. Inferior mesenteric angiogram demonstrated widely patent inferior mesenteric artery with hypertrophied bilateral superior rectal arteries which are tortuous distally where there was mild blushing compatible with known internal hemorrhoids. A 2.4 [place] Progreat and 0.04 inch fathom microwire were then used to cannulate the right superior rectal artery. Superior rectal angiogram was performed which again demonstrated hypertrophy and tortuosity in the region of the right-sided internal hemorrhoids. Particle embolization was performed with 400 micron Hydrospheres until near stasis was achieved. This was followed by coil embolization with 3 mm and 2 mm 0.018" Azur microcoils. Repeat superior rectal angiogram demonstrated successful embolization. The microcatheter and wire were then retracted and used to select the left superior rectal artery. Distal left superior rectal angiogram was performed which demonstrated hypertrophy and tortuosity in the region of the left internal hemorrhoids. Particle embolization was performed with 400 micron Hydrospheres until near stasis was achieved. This was followed by coil embolization with 3 mm and 2 mm 0.018" Azur microcoils. Repeat left superior rectal angiogram demonstrated successful embolization. Repeat  angiogram from the proximal superior rectal artery was then performed which demonstrated middle rectal artery branches supplying the internal hemorrhoids., Therefore, proximal bilateral superior rectal artery was coil embolized with 3 mm and 4 mm Azur 0.018" microcoils. Completion superior rectal angiogram demonstrated successful embolization of the bilateral superior rectal and middle rectal arteries without evidence of opacification of the previously visualized internal hemorrhoids. The catheters were removed. The right common femoral artery was closed with a 6 French Angio-Seal device successfully. Peripheral pulses were unchanged. The patient tolerated the procedure well was transferred back to the floor in stable condition. IMPRESSION: Technically successful particle and coil embolization of the bilateral superior rectal arteries. Marliss Coots, MD [facility] Radiology Electronically Signed   By: Marliss Coots M.D.   On: 02/10/2022 09:18   DG Chest 2 View  Result Date: 02/08/2022 CLINICAL DATA:  Nonproductive cough. EXAM: CHEST - 2 VIEW COMPARISON:  01/27/2022 FINDINGS: The heart size and mediastinal contours are within normal limits. Both lungs are clear. Right shoulder prosthesis again noted. IMPRESSION: No active cardiopulmonary disease. Electronically Signed   By: Danae Orleans M.D.   On: 02/08/2022 11:26    Labs:  CBC: Recent Labs    02/07/22 0549 02/08/22 0712 02/09/22 0514 02/10/22 0517  WBC 3.6* 3.4* 4.0 4.6  HGB 7.6* 7.5* 8.3* 8.9*  HCT 23.5* 23.2* 26.1* 28.2*  PLT 128* 132* 104* 132*    COAGS: Recent Labs    01/27/22 1808 01/28/22 1707 02/04/22 1245  INR 1.2 1.2 1.3*  APTT  --   --  31    BMP: Recent Labs    02/05/22 0515 02/07/22 0549 02/08/22 0712 02/10/22 0517  NA 136 141 135 135  K 3.8 4.2 4.0 3.0*  CL 112* 120* 112* 109  CO2 21* 21* 18* 21*  GLUCOSE 199* 162* 152* 125*  BUN 39* 32* 36* 36*  CALCIUM 8.0*  8.3* 7.8* 8.0*  CREATININE 1.60* 1.53* 1.41* 1.37*  GFRNONAA 36* 38* 42* 43*    LIVER FUNCTION TESTS: Recent Labs    01/28/22 0507 02/04/22 1245 02/07/22 0549 02/10/22 0517  BILITOT 2.1* 1.5* 1.6* 1.4*  AST 33 36 36 47*  ALT 18 22 23 24   ALKPHOS 88 85 83 99  PROT 4.6* 4.3* 4.3* 4.9*  ALBUMIN 2.5* 2.5* 2.2* 2.4*    Assessment and Plan: This is a  66 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  S/p Successful particle and coil embolization of the bilateral superior rectal arteries via RCFA on 6/23.   Patient no longer requiring blood transfusions last transfusion 6/24 with H/H improved and stable, no signs of active hemorrhoidal bleeding. Fever on 6/24 likely unrelated to procedure. RCFA access site without complication and dressing can be removed.   Discharge Follow Up:  Patient will have a 1 month phone follow-up with Dr. Elby Showers.  [facility] Radiology [address], [place] [address].  Our office will call the patient to arrange a follow-up appointment [phone].     Electronically Signed: Berneta Levins, PA-C 02/10/2022, 12:10 PM   I spent a total of 15 Minutes at the the patient's bedside AND on the patient's hospital floor or unit, greater than 50% of which was counseling/coordinating care for hemorrhoidal bleeding.

## 2022-02-11 ENCOUNTER — Other Ambulatory Visit: Payer: Self-pay | Admitting: Gastroenterology

## 2022-02-11 ENCOUNTER — Other Ambulatory Visit: Payer: Self-pay | Admitting: Interventional Radiology

## 2022-02-11 ENCOUNTER — Ambulatory Visit
Admission: RE | Admit: 2022-02-11 | Discharge: 2022-02-11 | Disposition: A | Discharge status: Home / Self Care | Payer: 59 | Source: Ambulatory Visit | Attending: Gastroenterology | Admitting: Gastroenterology

## 2022-02-11 ENCOUNTER — Ambulatory Visit: Admit: 2022-02-11 | Payer: 59

## 2022-02-11 DIAGNOSIS — R188 Other ascites: Secondary | ICD-10-CM | POA: Diagnosis not present

## 2022-02-11 DIAGNOSIS — K7581 Nonalcoholic steatohepatitis (NASH): Secondary | ICD-10-CM | POA: Insufficient documentation

## 2022-02-11 DIAGNOSIS — K7469 Other cirrhosis of liver: Secondary | ICD-10-CM | POA: Diagnosis not present

## 2022-02-11 DIAGNOSIS — K7460 Unspecified cirrhosis of liver: Secondary | ICD-10-CM

## 2022-02-11 DIAGNOSIS — R58 Hemorrhage, not elsewhere classified: Secondary | ICD-10-CM

## 2022-02-11 MED ORDER — ALBUMIN HUMAN 25 % IV SOLN
INTRAVENOUS | Status: AC
  Administered 2022-02-11: 25 g
  Filled 2022-02-11: qty 100

## 2022-02-11 MED ORDER — ALBUMIN HUMAN 25 % IV SOLN
INTRAVENOUS | Status: AC
  Administered 2022-02-11: 25 g via INTRAVENOUS
  Filled 2022-02-11: qty 100

## 2022-02-11 MED ORDER — ALBUMIN HUMAN 25 % IV SOLN: 25.0000 g | Freq: Once | INTRAVENOUS | Status: AC

## 2022-02-12 ENCOUNTER — Other Ambulatory Visit (HOSPITAL_COMMUNITY): Payer: Self-pay | Admitting: Physician Assistant

## 2022-02-12 DIAGNOSIS — D649 Anemia, unspecified: Secondary | ICD-10-CM

## 2022-02-13 ENCOUNTER — Other Ambulatory Visit: Payer: Self-pay | Admitting: Gastroenterology

## 2022-02-13 DIAGNOSIS — K7460 Unspecified cirrhosis of liver: Secondary | ICD-10-CM

## 2022-02-14 LAB — CULTURE, BLOOD (ROUTINE X 2)
Culture: NO GROWTH
Culture: NO GROWTH
Special Requests: ADEQUATE
Special Requests: ADEQUATE

## 2022-02-19 ENCOUNTER — Ambulatory Visit
Admission: RE | Admit: 2022-02-19 | Discharge: 2022-02-19 | Disposition: A | Discharge status: Home / Self Care | Payer: 59 | Source: Ambulatory Visit | Attending: Gastroenterology | Admitting: Gastroenterology

## 2022-02-19 ENCOUNTER — Ambulatory Visit: Payer: 59

## 2022-02-19 DIAGNOSIS — K7581 Nonalcoholic steatohepatitis (NASH): Secondary | ICD-10-CM | POA: Insufficient documentation

## 2022-02-19 DIAGNOSIS — K7460 Unspecified cirrhosis of liver: Secondary | ICD-10-CM | POA: Insufficient documentation

## 2022-02-19 DIAGNOSIS — R188 Other ascites: Secondary | ICD-10-CM | POA: Diagnosis not present

## 2022-02-19 MED ORDER — ALBUMIN HUMAN 25 % IV SOLN
INTRAVENOUS | Status: AC
  Administered 2022-02-19: 25 g via INTRAVENOUS
  Filled 2022-02-19: qty 100

## 2022-02-19 MED ORDER — ALBUMIN HUMAN 25 % IV SOLN: 25.0000 g | Freq: Once | INTRAVENOUS | Status: AC

## 2022-02-19 NOTE — Procedures (Signed)
PROCEDURE SUMMARY:  Successful ultrasound guided paracentesis from the RUQ. Yielded 7.5 L of clear yellow fluid.  No immediate complications.  The patient tolerated the procedure well.   Specimen was not sent for labs.  EBL <  50m  [facility], [facility]   MTsosie BillingD, PA-C 02/19/2022, 12:12 PM

## 2022-02-21 ENCOUNTER — Inpatient Hospital Stay (HOSPITAL_COMMUNITY): Payer: 59 | Attending: Physician Assistant

## 2022-02-21 DIAGNOSIS — E559 Vitamin D deficiency, unspecified: Secondary | ICD-10-CM | POA: Diagnosis not present

## 2022-02-21 DIAGNOSIS — D500 Iron deficiency anemia secondary to blood loss (chronic): Secondary | ICD-10-CM | POA: Diagnosis not present

## 2022-02-21 DIAGNOSIS — D563 Thalassemia minor: Secondary | ICD-10-CM | POA: Insufficient documentation

## 2022-02-21 DIAGNOSIS — E538 Deficiency of other specified B group vitamins: Secondary | ICD-10-CM | POA: Insufficient documentation

## 2022-02-21 DIAGNOSIS — D649 Anemia, unspecified: Secondary | ICD-10-CM

## 2022-02-21 LAB — CBC
HCT: 23.1 % — ABNORMAL LOW (ref 36.0–46.0)
Hemoglobin: 7.1 g/dL — ABNORMAL LOW (ref 12.0–15.0)
MCH: 23.7 pg — ABNORMAL LOW (ref 26.0–34.0)
MCHC: 30.7 g/dL (ref 30.0–36.0)
MCV: 77 fL — ABNORMAL LOW (ref 80.0–100.0)
Platelets: 163 10*3/uL (ref 150–400)
RBC: 3 MIL/uL — ABNORMAL LOW (ref 3.87–5.11)
RDW: 23.5 % — ABNORMAL HIGH (ref 11.5–15.5)
WBC: 5.3 10*3/uL (ref 4.0–10.5)
nRBC: 0 % (ref 0.0–0.2)

## 2022-02-21 LAB — SAMPLE TO BLOOD BANK

## 2022-02-24 NOTE — Progress Notes (Signed)
NURSE POOL: Please call Rachel Huber for telephone check.  Her hemoglobin was right on the edge when checked on Friday, 02/21/2022 (Hgb 7.1).  She has beta thalassemia, so she always tends to be on the lower side, but if she drops to Hgb <7.0 she would need another PRBC transfusion.  Please check in to see how she is feeling... if she is having any ongoing rectal bleeding or feeling poorly, I would like to check another CBC/blood bank sample on her this week.  Otherwise, she is scheduled for repeat labs next week (Friday 03/07/2022) and can wait until then.  Thanks!

## 2022-02-25 ENCOUNTER — Ambulatory Visit
Admission: RE | Admit: 2022-02-25 | Discharge: 2022-02-25 | Disposition: A | Discharge status: Home / Self Care | Payer: 59 | Source: Ambulatory Visit | Attending: Gastroenterology | Admitting: Gastroenterology

## 2022-02-25 DIAGNOSIS — K7460 Unspecified cirrhosis of liver: Secondary | ICD-10-CM | POA: Insufficient documentation

## 2022-02-25 DIAGNOSIS — R188 Other ascites: Secondary | ICD-10-CM | POA: Diagnosis not present

## 2022-02-25 DIAGNOSIS — K7581 Nonalcoholic steatohepatitis (NASH): Secondary | ICD-10-CM | POA: Insufficient documentation

## 2022-02-25 MED ORDER — ALBUMIN HUMAN 25 % IV SOLN: 25.0000 g | Freq: Once | INTRAVENOUS | Status: AC

## 2022-02-25 MED ORDER — ALBUMIN HUMAN 25 % IV SOLN
INTRAVENOUS | Status: AC
  Administered 2022-02-25: 25 g via INTRAVENOUS
  Filled 2022-02-25: qty 100

## 2022-02-25 NOTE — Progress Notes (Signed)
Patient called and had some mild bleeding over the weekend, however it has stopped.  States that she is not at all symptomatic at this time and would like to wait until the 21st to recheck labs.  She will call us if anything changes and have labs done this week.

## 2022-02-25 NOTE — Procedures (Signed)
PROCEDURE SUMMARY:  Successful US guided paracentesis from RLQ.  Yielded 6.4 L of clear yellow fluid.  No immediate complications.  Pt tolerated well.   Specimen was not sent for labs.  The patient has reportedly had a TIPS procedure at [facility] on 12/26/2021 and is awaiting liver transplant there, she is followed by the liver clinic at [facility].   EBL < 30m  MRockney Ghee7/06/2022 12:31 PM

## 2022-03-04 ENCOUNTER — Other Ambulatory Visit: Payer: Self-pay | Admitting: Gastroenterology

## 2022-03-04 ENCOUNTER — Ambulatory Visit
Admission: RE | Admit: 2022-03-04 | Discharge: 2022-03-04 | Disposition: A | Discharge status: Home / Self Care | Payer: 59 | Source: Ambulatory Visit | Attending: Gastroenterology | Admitting: Gastroenterology

## 2022-03-04 DIAGNOSIS — E1140 Type 2 diabetes mellitus with diabetic neuropathy, unspecified: Secondary | ICD-10-CM | POA: Diagnosis not present

## 2022-03-04 DIAGNOSIS — K7460 Unspecified cirrhosis of liver: Secondary | ICD-10-CM

## 2022-03-04 DIAGNOSIS — D62 Acute posthemorrhagic anemia: Secondary | ICD-10-CM | POA: Diagnosis not present

## 2022-03-04 DIAGNOSIS — I10 Essential (primary) hypertension: Secondary | ICD-10-CM | POA: Diagnosis not present

## 2022-03-04 DIAGNOSIS — F325 Major depressive disorder, single episode, in full remission: Secondary | ICD-10-CM | POA: Diagnosis not present

## 2022-03-04 DIAGNOSIS — K643 Fourth degree hemorrhoids: Secondary | ICD-10-CM | POA: Diagnosis not present

## 2022-03-04 DIAGNOSIS — Z0000 Encounter for general adult medical examination without abnormal findings: Secondary | ICD-10-CM | POA: Diagnosis not present

## 2022-03-04 DIAGNOSIS — R188 Other ascites: Secondary | ICD-10-CM | POA: Diagnosis not present

## 2022-03-04 DIAGNOSIS — G4733 Obstructive sleep apnea (adult) (pediatric): Secondary | ICD-10-CM | POA: Diagnosis not present

## 2022-03-04 DIAGNOSIS — K7581 Nonalcoholic steatohepatitis (NASH): Secondary | ICD-10-CM | POA: Insufficient documentation

## 2022-03-04 DIAGNOSIS — Z6841 Body Mass Index (BMI) 40.0 and over, adult: Secondary | ICD-10-CM | POA: Diagnosis not present

## 2022-03-04 DIAGNOSIS — K7469 Other cirrhosis of liver: Secondary | ICD-10-CM | POA: Diagnosis not present

## 2022-03-04 DIAGNOSIS — Z9989 Dependence on other enabling machines and devices: Secondary | ICD-10-CM | POA: Diagnosis not present

## 2022-03-04 MED ORDER — ALBUMIN HUMAN 25 % IV SOLN: 25.0000 g | Freq: Once | INTRAVENOUS | Status: AC

## 2022-03-04 MED ORDER — ALBUMIN HUMAN 25 % IV SOLN: 25.0000 g | Freq: Once | INTRAVENOUS | Status: DC

## 2022-03-04 MED ORDER — ALBUMIN HUMAN 25 % IV SOLN
INTRAVENOUS | Status: AC
  Administered 2022-03-04: 25 g via INTRAVENOUS
  Filled 2022-03-04: qty 100

## 2022-03-04 NOTE — Procedures (Signed)
PROCEDURE SUMMARY:  Successful ultrasound guided paracentesis from the RUQ. Yielded 7 L of clear yellow fluid.  No immediate complications.  The patient tolerated the procedure well.   Specimen was not sent for labs.  EBL <  39m  [facility], [facility]  MHedy Jacob PA-C 03/04/2022, 12:22 PM

## 2022-03-05 ENCOUNTER — Encounter: Payer: Self-pay | Admitting: Emergency Medicine

## 2022-03-05 ENCOUNTER — Inpatient Hospital Stay
Admission: EM | Admit: 2022-03-05 | Discharge: 2022-03-07 | DRG: 394 | Disposition: A | Discharge status: Home / Self Care | Payer: 59 | Attending: Internal Medicine | Admitting: Internal Medicine

## 2022-03-05 ENCOUNTER — Other Ambulatory Visit: Payer: Self-pay

## 2022-03-05 DIAGNOSIS — K7581 Nonalcoholic steatohepatitis (NASH): Secondary | ICD-10-CM | POA: Diagnosis not present

## 2022-03-05 DIAGNOSIS — Z96611 Presence of right artificial shoulder joint: Secondary | ICD-10-CM | POA: Diagnosis present

## 2022-03-05 DIAGNOSIS — R188 Other ascites: Secondary | ICD-10-CM | POA: Diagnosis present

## 2022-03-05 DIAGNOSIS — D696 Thrombocytopenia, unspecified: Secondary | ICD-10-CM

## 2022-03-05 DIAGNOSIS — K766 Portal hypertension: Secondary | ICD-10-CM | POA: Diagnosis not present

## 2022-03-05 DIAGNOSIS — Z6839 Body mass index (BMI) 39.0-39.9, adult: Secondary | ICD-10-CM

## 2022-03-05 DIAGNOSIS — D563 Thalassemia minor: Secondary | ICD-10-CM | POA: Diagnosis present

## 2022-03-05 DIAGNOSIS — E1165 Type 2 diabetes mellitus with hyperglycemia: Secondary | ICD-10-CM | POA: Diagnosis not present

## 2022-03-05 DIAGNOSIS — D62 Acute posthemorrhagic anemia: Secondary | ICD-10-CM | POA: Diagnosis not present

## 2022-03-05 DIAGNOSIS — Z8249 Family history of ischemic heart disease and other diseases of the circulatory system: Secondary | ICD-10-CM

## 2022-03-05 DIAGNOSIS — Z8542 Personal history of malignant neoplasm of other parts of uterus: Secondary | ICD-10-CM

## 2022-03-05 DIAGNOSIS — Z8616 Personal history of COVID-19: Secondary | ICD-10-CM

## 2022-03-05 DIAGNOSIS — K219 Gastro-esophageal reflux disease without esophagitis: Secondary | ICD-10-CM | POA: Diagnosis not present

## 2022-03-05 DIAGNOSIS — K643 Fourth degree hemorrhoids: Secondary | ICD-10-CM | POA: Diagnosis not present

## 2022-03-05 DIAGNOSIS — Z807 Family history of other malignant neoplasms of lymphoid, hematopoietic and related tissues: Secondary | ICD-10-CM

## 2022-03-05 DIAGNOSIS — R4701 Aphasia: Secondary | ICD-10-CM | POA: Diagnosis present

## 2022-03-05 DIAGNOSIS — K7469 Other cirrhosis of liver: Secondary | ICD-10-CM | POA: Diagnosis not present

## 2022-03-05 DIAGNOSIS — D6959 Other secondary thrombocytopenia: Secondary | ICD-10-CM | POA: Diagnosis not present

## 2022-03-05 DIAGNOSIS — K625 Hemorrhage of anus and rectum: Secondary | ICD-10-CM | POA: Diagnosis not present

## 2022-03-05 DIAGNOSIS — I868 Varicose veins of other specified sites: Secondary | ICD-10-CM | POA: Diagnosis present

## 2022-03-05 DIAGNOSIS — E1169 Type 2 diabetes mellitus with other specified complication: Secondary | ICD-10-CM

## 2022-03-05 DIAGNOSIS — E1122 Type 2 diabetes mellitus with diabetic chronic kidney disease: Secondary | ICD-10-CM | POA: Diagnosis present

## 2022-03-05 DIAGNOSIS — I10 Essential (primary) hypertension: Secondary | ICD-10-CM | POA: Diagnosis not present

## 2022-03-05 DIAGNOSIS — K921 Melena: Secondary | ICD-10-CM

## 2022-03-05 DIAGNOSIS — K648 Other hemorrhoids: Secondary | ICD-10-CM | POA: Diagnosis present

## 2022-03-05 DIAGNOSIS — I129 Hypertensive chronic kidney disease with stage 1 through stage 4 chronic kidney disease, or unspecified chronic kidney disease: Secondary | ICD-10-CM | POA: Diagnosis not present

## 2022-03-05 DIAGNOSIS — E669 Obesity, unspecified: Secondary | ICD-10-CM | POA: Diagnosis not present

## 2022-03-05 DIAGNOSIS — N1832 Chronic kidney disease, stage 3b: Secondary | ICD-10-CM | POA: Diagnosis not present

## 2022-03-05 DIAGNOSIS — D561 Beta thalassemia: Secondary | ICD-10-CM | POA: Diagnosis present

## 2022-03-05 DIAGNOSIS — Z01818 Encounter for other preprocedural examination: Secondary | ICD-10-CM | POA: Diagnosis not present

## 2022-03-05 DIAGNOSIS — E785 Hyperlipidemia, unspecified: Secondary | ICD-10-CM | POA: Diagnosis not present

## 2022-03-05 DIAGNOSIS — F418 Other specified anxiety disorders: Secondary | ICD-10-CM | POA: Diagnosis not present

## 2022-03-05 DIAGNOSIS — E8881 Metabolic syndrome: Secondary | ICD-10-CM | POA: Diagnosis present

## 2022-03-05 DIAGNOSIS — Z803 Family history of malignant neoplasm of breast: Secondary | ICD-10-CM | POA: Diagnosis not present

## 2022-03-05 DIAGNOSIS — Z9989 Dependence on other enabling machines and devices: Secondary | ICD-10-CM | POA: Diagnosis not present

## 2022-03-05 DIAGNOSIS — K7460 Unspecified cirrhosis of liver: Secondary | ICD-10-CM

## 2022-03-05 DIAGNOSIS — G4733 Obstructive sleep apnea (adult) (pediatric): Secondary | ICD-10-CM

## 2022-03-05 DIAGNOSIS — E1140 Type 2 diabetes mellitus with diabetic neuropathy, unspecified: Secondary | ICD-10-CM | POA: Diagnosis present

## 2022-03-05 DIAGNOSIS — D649 Anemia, unspecified: Secondary | ICD-10-CM | POA: Diagnosis not present

## 2022-03-05 DIAGNOSIS — Z8051 Family history of malignant neoplasm of kidney: Secondary | ICD-10-CM

## 2022-03-05 DIAGNOSIS — Z7682 Awaiting organ transplant status: Secondary | ICD-10-CM

## 2022-03-05 DIAGNOSIS — Z833 Family history of diabetes mellitus: Secondary | ICD-10-CM

## 2022-03-05 DIAGNOSIS — R69 Illness, unspecified: Secondary | ICD-10-CM | POA: Diagnosis not present

## 2022-03-05 DIAGNOSIS — Z79899 Other long term (current) drug therapy: Secondary | ICD-10-CM

## 2022-03-05 DIAGNOSIS — Z90710 Acquired absence of both cervix and uterus: Secondary | ICD-10-CM | POA: Diagnosis not present

## 2022-03-05 DIAGNOSIS — R269 Unspecified abnormalities of gait and mobility: Secondary | ICD-10-CM | POA: Diagnosis not present

## 2022-03-05 LAB — COMPREHENSIVE METABOLIC PANEL
ALT: 24 U/L (ref 0–44)
AST: 42 U/L — ABNORMAL HIGH (ref 15–41)
Albumin: 2.5 g/dL — ABNORMAL LOW (ref 3.5–5.0)
Alkaline Phosphatase: 111 U/L (ref 38–126)
Anion gap: 4 — ABNORMAL LOW (ref 5–15)
BUN: 36 mg/dL — ABNORMAL HIGH (ref 8–23)
CO2: 22 mmol/L (ref 22–32)
Calcium: 8.2 mg/dL — ABNORMAL LOW (ref 8.9–10.3)
Chloride: 111 mmol/L (ref 98–111)
Creatinine, Ser: 1.58 mg/dL — ABNORMAL HIGH (ref 0.44–1.00)
GFR, Estimated: 36 mL/min — ABNORMAL LOW (ref >60)
Glucose, Bld: 247 mg/dL — ABNORMAL HIGH (ref 70–99)
Potassium: 3.4 mmol/L — ABNORMAL LOW (ref 3.5–5.1)
Sodium: 137 mmol/L (ref 135–145)
Total Bilirubin: 1.5 mg/dL — ABNORMAL HIGH (ref 0.3–1.2)
Total Protein: 4.7 g/dL — ABNORMAL LOW (ref 6.5–8.1)

## 2022-03-05 LAB — CBC
HCT: 17 % — ABNORMAL LOW (ref 36.0–46.0)
Hemoglobin: 5.1 g/dL — ABNORMAL LOW (ref 12.0–15.0)
MCH: 22.7 pg — ABNORMAL LOW (ref 26.0–34.0)
MCHC: 30 g/dL (ref 30.0–36.0)
MCV: 75.6 fL — ABNORMAL LOW (ref 80.0–100.0)
Platelets: 127 10*3/uL — ABNORMAL LOW (ref 150–400)
RBC: 2.25 MIL/uL — ABNORMAL LOW (ref 3.87–5.11)
RDW: 22.2 % — ABNORMAL HIGH (ref 11.5–15.5)
WBC: 4.2 10*3/uL (ref 4.0–10.5)
nRBC: 0.5 % — ABNORMAL HIGH (ref 0.0–0.2)

## 2022-03-05 LAB — PROTIME-INR
INR: 1.2 (ref 0.8–1.2)
Prothrombin Time: 15.3 seconds — ABNORMAL HIGH (ref 11.4–15.2)

## 2022-03-05 LAB — PREPARE RBC (CROSSMATCH)

## 2022-03-05 LAB — CBG MONITORING, ED: Glucose-Capillary: 226 mg/dL — ABNORMAL HIGH (ref 70–99)

## 2022-03-05 MED ORDER — INSULIN ASPART 100 UNIT/ML IJ SOLN
0.0000 [IU] | INTRAMUSCULAR | Status: DC
  Administered 2022-03-05: 7 [IU] via SUBCUTANEOUS
  Administered 2022-03-06 (×2): 4 [IU] via SUBCUTANEOUS
  Filled 2022-03-05 (×3): qty 1

## 2022-03-05 MED ORDER — LACTULOSE 10 GM/15ML PO SOLN
20.0000 g | Freq: Every day | ORAL | Status: DC
  Filled 2022-03-05 (×2): qty 30

## 2022-03-05 MED ORDER — MELATONIN 5 MG PO TABS: 10.0000 mg | ORAL_TABLET | Freq: Every evening | ORAL | Status: DC | PRN

## 2022-03-05 MED ORDER — VITAMIN B-12 1000 MCG PO TABS
1000.0000 ug | ORAL_TABLET | Freq: Every day | ORAL | Status: DC
  Administered 2022-03-06 – 2022-03-07 (×2): 1000 ug via ORAL
  Filled 2022-03-05 (×2): qty 1

## 2022-03-05 MED ORDER — SODIUM CHLORIDE 0.9 % IV SOLN: INTRAVENOUS | Status: DC

## 2022-03-05 MED ORDER — ONDANSETRON HCL 4 MG PO TABS: 4.0000 mg | ORAL_TABLET | Freq: Four times a day (QID) | ORAL | Status: DC | PRN

## 2022-03-05 MED ORDER — ACETAMINOPHEN 325 MG PO TABS: 650.0000 mg | ORAL_TABLET | Freq: Four times a day (QID) | ORAL | Status: DC | PRN

## 2022-03-05 MED ORDER — ACETAMINOPHEN 650 MG RE SUPP: 650.0000 mg | Freq: Four times a day (QID) | RECTAL | Status: DC | PRN

## 2022-03-05 MED ORDER — GABAPENTIN 300 MG PO CAPS
800.0000 mg | ORAL_CAPSULE | Freq: Every day | ORAL | Status: DC
  Administered 2022-03-06 (×2): 800 mg via ORAL
  Filled 2022-03-05 (×2): qty 2

## 2022-03-05 MED ORDER — SERTRALINE HCL 50 MG PO TABS
50.0000 mg | ORAL_TABLET | Freq: Every day | ORAL | Status: DC
  Administered 2022-03-06 – 2022-03-07 (×2): 50 mg via ORAL
  Filled 2022-03-05 (×2): qty 1

## 2022-03-05 MED ORDER — SALINE SPRAY 0.65 % NA SOLN
1.0000 | NASAL | Status: DC | PRN
Start: 2022-03-05 — End: 2022-03-07

## 2022-03-05 MED ORDER — MORPHINE SULFATE (PF) 2 MG/ML IV SOLN: 2.0000 mg | INTRAVENOUS | Status: DC | PRN

## 2022-03-05 MED ORDER — DIPHENHYDRAMINE HCL 25 MG PO CAPS
25.0000 mg | ORAL_CAPSULE | Freq: Two times a day (BID) | ORAL | Status: DC | PRN
Start: 2022-03-05 — End: 2022-03-07

## 2022-03-05 MED ORDER — ONDANSETRON HCL 4 MG/2ML IJ SOLN: 4.0000 mg | Freq: Four times a day (QID) | INTRAMUSCULAR | Status: DC | PRN

## 2022-03-05 MED ORDER — PANTOPRAZOLE SODIUM 40 MG PO TBEC: 40.0000 mg | DELAYED_RELEASE_TABLET | Freq: Every day | ORAL | Status: DC

## 2022-03-05 MED ORDER — ALBUTEROL SULFATE (2.5 MG/3ML) 0.083% IN NEBU
3.0000 mL | INHALATION_SOLUTION | Freq: Four times a day (QID) | RESPIRATORY_TRACT | Status: DC | PRN
Start: 2022-03-05 — End: 2022-03-07

## 2022-03-05 MED ORDER — ATORVASTATIN CALCIUM 20 MG PO TABS
10.0000 mg | ORAL_TABLET | Freq: Every day | ORAL | Status: DC
  Administered 2022-03-06 – 2022-03-07 (×2): 10 mg via ORAL
  Filled 2022-03-05 (×2): qty 1

## 2022-03-05 MED ORDER — SODIUM CHLORIDE 0.9 % IV SOLN
10.0000 mL/h | Freq: Once | INTRAVENOUS | Status: AC
  Administered 2022-03-06: 10 mL/h via INTRAVENOUS

## 2022-03-05 NOTE — Assessment & Plan Note (Addendum)
Appears mostly compensated. On transplant list and saw her doctors at [facility] earlier in the day on 7/19 S/p TIPS 12-26-2021 Weekly paracentesis Continue lactulose daily.  Not currently on Lasix, spironolactone because of kidney function

## 2022-03-05 NOTE — ED Triage Notes (Signed)
Arrives from [facility].  H/H 5.5 / 18 today.  Previous H/H from 6/20:  5 / 16  AAOx3.  Skin warm and dry. NAD

## 2022-03-05 NOTE — H&P (Signed)
History and Physical    Patient: Rachel Huber CBS:[id number] DOB: [birth year] DOA: 03/05/2022 DOS: the patient was seen and examined on 03/05/2022 PCP: [name], MD  Patient coming from: Home  Chief Complaint:  Chief Complaint  Patient presents with   Abnormal Lab    HPI: Rachel Huber is a 66 y.o. 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  Patient reports that since her coil embolization her rectal bleeding has slowed down significantly but she continues to see blood with her bowel movements, having 3-4 episodes a day and occasional clots.  She is symptomatic for fatigue, but denies shortness of breath or chest pain or significant abdominal pain. ED course and data review: BP on arrival 100/42 falling to as low as 99/49 with otherwise normal vitals. Labs hemoglobin 5.1, down from baseline around 7.  Hemoglobin was 8.9 at discharge on 6/26.  Platelets 127,000 which is her baseline but was normal at 1 63,000 at discharge on 6/26.  Creatinine at baseline at 1.58.  AST 42, ALT 24, bili 1.5, INR 1.2. Patient was typed and crossed and started on 2 units PRBCs.  Hospitalist consulted for admission.   Review of Systems: As mentioned in the history of present illness. All other systems reviewed and are negative.  Past Medical History:  Diagnosis Date   Anxiety    Arthritis    Cirrhosis of liver ([place])    Diabetes mellitus without complication (HCC)    Dyspnea    DOE   GERD (gastroesophageal reflux disease)    Hepatitis    PAST   Hypertension    Neuropathy     Neuropathy, diabetic ([place])    Pneumonia    Sinus complaint    Sleep apnea    CPAP   Thalassemia minor 1992   Past Surgical History:  Procedure Laterality Date   ABDOMINAL HYSTERECTOMY     BREAST BIOPSY Right 02/15/2018   [place] bx 6-6:30 ribbon shape, ONE CORE FRAGMENT WITH FIBROSIS. ONE CORE FRAGMENT WITH PORTION OF A DILATED   BREAST BIOPSY Right 02/15/2018   [place] bx 9:00 heart shape, USUAL DUCTAL HYPERPLASIA   BREAST LUMPECTOMY Right 03/09/2018   Procedure: BREAST LUMPECTOMY x 2;  Surgeon: Benjamine Sprague, DO;  Location: [facility] ORS;  Service: General;  Laterality: Right;   CATARACT EXTRACTION W/PHACO Left 06/11/2016   Procedure: CATARACT EXTRACTION PHACO AND INTRAOCULAR LENS PLACEMENT (IOC);  Surgeon: Estill Cotta, MD;  Location: [facility] ORS;  Service: Ophthalmology;  Laterality: Left;  Lot # [id number] H US:01:38.6 AP%:26.4 CDE:44.15   CATARACT EXTRACTION W/PHACO Right 06/15/2018   Procedure: CATARACT EXTRACTION PHACO AND INTRAOCULAR LENS PLACEMENT (IOC);  Surgeon: Birder Robson, MD;  Location: [facility] ORS;  Service: Ophthalmology;  Laterality: Right;  [place] 00:38.2 CDE 4.23 Fluid Pack Lot # [id number]   COLONOSCOPIES     COLONOSCOPY WITH PROPOFOL N/A 10/10/2020   Procedure: COLONOSCOPY WITH PROPOFOL;  Surgeon: Lesly Rubenstein, MD;  Location: [facility] ENDOSCOPY;  Service: Endoscopy;  Laterality: N/A;   COLONOSCOPY WITH PROPOFOL N/A 11/20/2020   Procedure: COLONOSCOPY WITH PROPOFOL;  Surgeon: Lesly Rubenstein, MD;  Location: [facility] ENDOSCOPY;  Service: Endoscopy;  Laterality: N/A;  DM STAT CBC, BMP COVID POSITIVE 09/02/2020   CTR     ESOPHAGOGASTRODUODENOSCOPY (EGD) WITH PROPOFOL N/A 10/10/2020   Procedure: ESOPHAGOGASTRODUODENOSCOPY (EGD) WITH PROPOFOL;  Surgeon: Lesly Rubenstein, MD;  Location: [facility] ENDOSCOPY;  Service: Endoscopy;  Laterality: N/A;  COVID POSITIVE 10/08/2020   FLEXIBLE SIGMOIDOSCOPY N/A 02/06/2022   Procedure: FLEXIBLE SIGMOIDOSCOPY;  Surgeon: Lesly Rubenstein, MD;  Location:  [facility] ENDOSCOPY;  Service: Endoscopy;  Laterality: N/A;  Patient requests anesthesia   IR ANGIOGRAM SELECTIVE EACH ADDITIONAL VESSEL  02/07/2022   IR ANGIOGRAM SELECTIVE EACH ADDITIONAL VESSEL  02/07/2022   IR ANGIOGRAM VISCERAL SELECTIVE  02/07/2022   IR EMBO ARTERIAL NOT HEMORR HEMANG INC GUIDE ROADMAPPING  02/07/2022   IR PARACENTESIS  12/17/2021   IR US GUIDE VASC ACCESS RIGHT  02/07/2022   JOINT REPLACEMENT     KNEE SURGERY Right 09/24/2017   plates and pins   TOTAL SHOULDER ARTHROPLASTY Right 09/27/2015   Social History:  reports that she has never smoked. She has never used smokeless tobacco. She reports that she does not drink alcohol and does not use drugs.  Allergies  Allergen Reactions   Latex Rash    Contact rash    Family History  Problem Relation Age of Onset   Breast cancer Mother 20   Lymphoma Mother    Diabetes Father    Kidney cancer Father    Heart disease Father    Diabetes Sister    Breast cancer Sister 89    Prior to Admission medications   Medication Sig Start Date End Date Taking? Authorizing Provider  acetaminophen (TYLENOL) 650 MG CR tablet Take 1,300 mg by mouth daily as needed for pain.    [provider]  albuterol (VENTOLIN HFA) 108 (90 Base) MCG/ACT inhaler Inhale 2 puffs into the lungs every 6 (six) hours as needed for wheezing or shortness of breath. 02/10/22   Loletha Grayer, MD  atorvastatin (LIPITOR) 10 MG tablet Take 10 mg by mouth daily. 01/28/21   [provider]  Cholecalciferol 50 MCG (2000 UT) CAPS Take by mouth.    [provider]  diphenhydrAMINE (BENADRYL) 25 MG tablet Take 25 mg by mouth 2 (two) times daily as needed for itching.    [provider]  gabapentin (NEURONTIN) 400 MG capsule Take 800 mg by mouth daily at 2 am. 01/07/18   [provider]  guaiFENesin-dextromethorphan (ROBITUSSIN DM) 100-10 MG/5ML syrup Take 5 mLs by mouth every 4 (four) hours as needed for cough. 09/13/20   Aline August, MD  hydrocortisone (ANUSOL-HC) 25 MG suppository Place 1 suppository (25 mg total) rectally 2 (two) times daily. 01/30/22   Roxan Hockey, MD  lactulose, encephalopathy, (CHRONULAC) 10 GM/15ML SOLN Take 30 mLs (20 g total) by mouth daily. 01/30/22   Roxan Hockey, MD  melatonin 5 MG TABS Take 10 mg by mouth at bedtime as needed (sleep).    [provider]  Multiple Vitamins-Minerals (MULTIVITAMIN WITH MINERALS) tablet Take 1 tablet by mouth daily.    [provider]  ondansetron (ZOFRAN-ODT) 4 MG disintegrating tablet Take 1 tablet (4 mg total) by mouth every 8 (eight) hours as needed for nausea or vomiting. 01/30/22   Denton Brick, Courage, MD  pantoprazole (PROTONIX) 40 MG tablet Take 1 tablet (40 mg total) by mouth daily. 01/30/22 01/30/23  Roxan Hockey, MD  sertraline (ZOLOFT) 50 MG tablet Take 50 mg by mouth daily. 12/12/17   [provider]  sodium chloride (OCEAN)  0.65 % SOLN nasal spray Place 1 spray into both nostrils as needed for congestion. 02/10/22   Loletha Grayer, MD  vitamin B-12 (CYANOCOBALAMIN) 1000 MCG tablet Take 1,000 mcg by mouth daily.    [provider]    Physical Exam: Vitals:   03/05/22 1930 03/05/22 1959 03/05/22 2000 03/05/22 2014  BP: (!) 101/48  (!) 105/48 (!) 111/51  Pulse: 83  85 85  Resp: 17  14 15   Temp: 97.8 F (36.6 C)   97.8 F (36.6 C)  TempSrc:  Oral  Oral  SpO2: 99%  98% 99%  Weight:      Height:       Physical Exam Vitals and nursing note reviewed.  Constitutional:      General: She is not in acute distress. HENT:     Head: Normocephalic and atraumatic.  Cardiovascular:     Rate and Rhythm: Normal rate and regular rhythm.     Heart sounds: Normal heart sounds.  Pulmonary:     Effort: Pulmonary effort is normal.     Breath sounds: Normal breath sounds.  Abdominal:     Palpations: Abdomen is soft.     Tenderness: There is no abdominal tenderness.  Musculoskeletal:     Right lower leg: Edema  present.     Left lower leg: Edema present.  Neurological:     Mental Status: Mental status is at baseline.     Labs on Admission: I have personally reviewed following labs and imaging studies  CBC: Recent Labs  Lab 03/05/22 1639  WBC 4.2  HGB 5.1*  HCT 17.0*  MCV 75.6*  PLT 248*   Basic Metabolic Panel: Recent Labs  Lab 03/05/22 1639  NA 137  K 3.4*  CL 111  CO2 22  GLUCOSE 247*  BUN 36*  CREATININE 1.58*  CALCIUM 8.2*   GFR: Estimated Creatinine Clearance: 50.2 mL/min (A) (by C-G formula based on SCr of 1.58 mg/dL (H)). Liver Function Tests: Recent Labs  Lab 03/05/22 1639  AST 42*  ALT 24  ALKPHOS 111  BILITOT 1.5*  PROT 4.7*  ALBUMIN 2.5*   No results for input(s): "LIPASE", "AMYLASE" in the last 168 hours. No results for input(s): "AMMONIA" in the last 168 hours. Coagulation Profile: Recent Labs  Lab 03/05/22 1650  INR 1.2   Cardiac Enzymes: No results for input(s): "CKTOTAL", "CKMB", "CKMBINDEX", "TROPONINI" in the last 168 hours. BNP (last 3 results) No results for input(s): "PROBNP" in the last 8760 hours. HbA1C: No results for input(s): "HGBA1C" in the last 72 hours. CBG: No results for input(s): "GLUCAP" in the last 168 hours. Lipid Profile: No results for input(s): "CHOL", "HDL", "LDLCALC", "TRIG", "CHOLHDL", "LDLDIRECT" in the last 72 hours. Thyroid Function Tests: No results for input(s): "TSH", "T4TOTAL", "FREET4", "T3FREE", "THYROIDAB" in the last 72 hours. Anemia Panel: No results for input(s): "VITAMINB12", "FOLATE", "FERRITIN", "TIBC", "IRON", "RETICCTPCT" in the last 72 hours. Urine analysis:    Component Value Date/Time   COLORURINE YELLOW (A) 10/25/2020 2026   APPEARANCEUR HAZY (A) 10/25/2020 2026   LABSPEC 1.017 10/25/2020 2026   PHURINE 5.0 10/25/2020 2026   GLUCOSEU NEGATIVE 10/25/2020 2026   HGBUR SMALL (A) 10/25/2020 2026   BILIRUBINUR NEGATIVE 10/25/2020 2026   KETONESUR NEGATIVE 10/25/2020 2026   PROTEINUR  NEGATIVE 10/25/2020 2026   NITRITE POSITIVE (A) 10/25/2020 2026   LEUKOCYTESUR SMALL (A) 10/25/2020 2026    Radiological Exams on Admission: US Paracentesis  Result Date: 03/04/2022 INDICATION: Patient with history of NASH cirrhosis  and recurrent ascites status post tips procedure at [facility], maximum volume request of 8 L to be drawn off per ordering provider. EXAM: ULTRASOUND GUIDED  PARACENTESIS MEDICATIONS: 1% lidocaine only. COMPLICATIONS: None immediate. PROCEDURE: Informed written consent was obtained from the patient after a discussion of the risks, benefits and alternatives to treatment. A timeout was performed prior to the initiation of the procedure. Initial ultrasound scanning demonstrates a large amount of ascites within the right upper abdominal quadrant. The right upper abdomen was prepped and draped in the usual sterile fashion. 1% lidocaine was used for local anesthesia. Following this, a 19 gauge, 10-cm, Yueh catheter was introduced. An ultrasound image was saved for documentation purposes. The paracentesis was performed. The catheter was removed and a dressing was applied. The patient tolerated the procedure well without immediate post procedural complication. Patient received post-procedure intravenous albumin; see nursing notes for details. FINDINGS: A total of approximately 7 Liters of clear yellow fluid was removed. IMPRESSION: Successful ultrasound-guided paracentesis yielding 7 liters of peritoneal fluid. PLAN: The patient has reportedly had a TIPS procedure at [facility] on 12/26/2021 and is awaiting liver transplant there, she is followed by the liver clinic at [facility]. This exam was performed by Tsosie Billing PA-C, and was supervised and interpreted by Dr. Annamaria Boots Electronically Signed   By: Jerilynn Mages.  Shick M.D.   On: 03/04/2022 12:35     Data Reviewed: Relevant notes from primary care and specialist visits, past discharge summaries as available in EHR, including Care  Everywhere. Prior diagnostic testing as pertinent to current admission diagnoses Updated medications and problem lists for reconciliation ED course, including vitals, labs, imaging, treatment and response to treatment Triage notes, nursing and pharmacy notes and ED provider's notes Notable results as noted in HPI   Assessment and Plan: * Acute on chronic blood loss anemia Hematochezia, suspect ongoing hemorrhoidal bleeding History of bleeding grade 4 hemorrhoids s/p bilateral superior rectal artery particle and coil embolization on 02/07/2022 History of large-volume transfusion for hemorrhoidal bleed June 2023, 4.5 units PRBC  Discharge hemoglobin was 8.9 but baseline around 7.  Hemoglobin today is 5.1 Started on transfusion of 2 units PRBCs in the ED Patient has had ongoing rectal bleeding since her discharge on 6/26, suspect persistent hemorrhoidal GI consulted for possible flex sig.  Can consider IR consult  Liver cirrhosis secondary to NASH ([place]) Appears mostly compensated. On transplant list and saw her doctors at [facility] earlier in the day on 7/19 S/p TIPS 12-26-2021 Weekly paracentesis Continue lactulose daily.  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  Continue to monitor hemoglobin  Depression with anxiety Continue sertraline  Hx of bleeding grade IV hemorrhoids s/p coil embolization by IR 02/07/22          DVT prophylaxis: SCDs  Consults: GI  Advance Care Planning:   Code Status: Prior   Family Communication: none  Disposition Plan:  Back to previous home environment  Severity of Illness: The appropriate patient status for this patient is INPATIENT. Inpatient status is judged to be reasonable and necessary in order to provide the required intensity of service to ensure the patient's safety.  The patient's presenting symptoms, physical exam findings, and initial radiographic and laboratory data in the context of their chronic comorbidities is felt to place them at high risk for further clinical deterioration. Furthermore, it is not anticipated that the patient will be medically stable for discharge from the hospital within 2 midnights of admission.   * I certify that at the point of admission it is my clinical judgment that the patient will require inpatient hospital care spanning beyond 2 midnights from the point of admission due to high intensity of service, high risk for further deterioration and high frequency of surveillance required.*  Author: Athena Masse, MD 03/05/2022 8:52 PM  For on call review [email or web address].

## 2022-03-05 NOTE — Assessment & Plan Note (Addendum)
Hematochezia, with ongoing hemorrhoidal bleeding History of bleeding grade 4 hemorrhoids s/p bilateral superior rectal artery particle and coil embolization on 02/07/2022 History of large-volume transfusion for hemorrhoidal bleed June 2023, 4.5 units PRBC  Discharge hemoglobin was 8.9 but baseline around 7.  Hemoglobin today is 5.1. Status post 2 units of packed red blood cells and hemoglobin came up to 7.6.  We will give 400 mg of IV Venofer today.    Hemoglobin 7.7.  Transfuse 1 unit of packed red blood cells before discharge today   We will give Anusol cream and witch hazel.  Recommend following up with hematology on a weekly basis.  They should be able to give iron infusions and even blood transfusions in the infusion center to avoid hospitalization.

## 2022-03-05 NOTE — ED Provider Notes (Signed)
[facility] Provider Note    Event Date/Time   First MD Initiated Contact with Patient 03/05/22 1600     (approximate)   History   Abnormal Lab   HPI  Rachel Huber is a 66 y.o. female past medical history of cirrhosis diabetes hypertension hyperlipidemia presents with low hemoglobin on outpatient labs.  Of note patient was admitted from 6/20-6/26 where she was found to have rectal bleeding and had bilateral superior rectal artery coil embolization.  Since leaving the hospital her bleeding has slowed down but she continues to have daily bleeding with bowel movements.  Has about 3-4 episodes per day.  Does pass some small clots and blood occasionally feels the toilet sometimes just with wiping.  She does feel somewhat fatigued but not as bad as she did when she came here several weeks ago.  Denies significant abdominal pain no chest pain or shortness of breath.  She followed up with outpatient transplant at [facility] today they did labs showed hemoglobin of 5.5 she was told to come to a hospital to get a transfusion.    Past Medical History:  Diagnosis Date   Anxiety    Arthritis    Cirrhosis of liver ([place])    Diabetes mellitus without complication (HCC)    Dyspnea    DOE   GERD (gastroesophageal reflux disease)    Hepatitis    PAST   Hypertension    Neuropathy    Neuropathy, diabetic ([place])    Pneumonia    Sinus complaint    Sleep apnea    CPAP   Thalassemia minor 1992    Patient Active Problem List   Diagnosis Date Noted   Fever 02/08/2022   Pancytopenia ([place]) 02/07/2022   GI bleeding 02/04/2022   Chronic kidney disease, stage 3b ([place]) 02/04/2022   Type II diabetes mellitus with renal manifestations ([place]) 02/04/2022   Depression with anxiety 02/04/2022   Cirrhosis of liver ([place])    Acute GI hemorrhage 01/28/2022   AKI (acute kidney injury) ([place]) 01/27/2022   Insomnia 01/27/2022   Iron deficiency anemia due to chronic blood loss 02/13/2021    History of uterine cancer 02/13/2021   Decompensated hepatic cirrhosis ([place]) 10/25/2020   OSA (obstructive sleep apnea) 10/25/2020   Glossitis 10/25/2020   Grade IV internal hemorrhoids    Acute blood loss anemia 10/08/2020   Hyponatremia 10/08/2020   Rectal bleeding 10/08/2020   Acute respiratory failure with hypoxia ([place]) 09/10/2020   Pneumonia due to COVID-19 virus 09/08/2020   Hip fracture, right ([place]) 09/04/2020   Closed right hip fracture ([place]) 09/02/2020   Diabetes mellitus, controlled ([place]) 09/02/2020   Thalassemia minor    HLD (hyperlipidemia)    Acute kidney injury superimposed on CKD ([place])    Depression    Fall at home, initial encounter    Nondisplaced fracture of greater trochanter of right femur, initial encounter for closed fracture [facility])      Physical Exam  Triage Vital Signs: ED Triage Vitals  Enc Vitals Group     BP 03/05/22 1554 (!) 100/42     Pulse Rate 03/05/22 1554 82     Resp 03/05/22 1554 20     Temp 03/05/22 1554 98.5 F (36.9 C)     Temp Source 03/05/22 1554 Oral     SpO2 03/05/22 1554 99 %     Weight 03/05/22 1545 268 lb 8.3 oz (121.8 kg)     Height 03/05/22 1545 5' 9"  (1.753 m)  Head Circumference --      Peak Flow --      Pain Score 03/05/22 1545 0     Pain Loc --      Pain Edu? --      Excl. in [place]? --     Most recent vital signs: Vitals:   03/05/22 1700 03/05/22 1745  BP: (!) 100/52 (!) 99/49  Pulse: 82 82  Resp: 19 18  Temp:    SpO2: 100% 100%     General: Awake, no distress.  Patient is pale CV:  Good peripheral perfusion.  Pitting edema bilaterally with venous stasis changes Resp:  Normal effort.  No increased work of breathing Abd:  No distention.  No tenderness Neuro:             Awake, Alert, Oriented x 3  Other:  External hemorrhoids on rectal exam, bright red blood on rectal exam   ED Results / Procedures / Treatments  Labs (all labs ordered are listed, but only abnormal results are displayed) Labs Reviewed   COMPREHENSIVE METABOLIC PANEL - Abnormal; Notable for the following components:      Result Value   Potassium 3.4 (*)    Glucose, Bld 247 (*)    BUN 36 (*)    Creatinine, Ser 1.58 (*)    Calcium 8.2 (*)    Total Protein 4.7 (*)    Albumin 2.5 (*)    AST 42 (*)    Total Bilirubin 1.5 (*)    GFR, Estimated 36 (*)    Anion gap 4 (*)    All other components within normal limits  CBC - Abnormal; Notable for the following components:   RBC 2.25 (*)    Hemoglobin 5.1 (*)    HCT 17.0 (*)    MCV 75.6 (*)    MCH 22.7 (*)    RDW 22.2 (*)    Platelets 127 (*)    nRBC 0.5 (*)    All other components within normal limits  PROTIME-INR - Abnormal; Notable for the following components:   Prothrombin Time 15.3 (*)    All other components within normal limits  POC OCCULT BLOOD, ED  TYPE AND SCREEN  PREPARE RBC (CROSSMATCH)     EKG     RADIOLOGY    PROCEDURES:  Critical Care performed: Yes, see critical care procedure note(s)  .1-3 Lead EKG Interpretation  Performed by: [name], MD Authorized by: [name], MD     Interpretation: normal     ECG rate assessment: normal     Rhythm: sinus rhythm     Ectopy: none     Conduction: normal     The patient is on the cardiac monitor to evaluate for evidence of arrhythmia and/or significant heart rate changes.   MEDICATIONS ORDERED IN ED: Medications  0.9 %  sodium chloride infusion (has no administration in time range)     IMPRESSION / MDM / ASSESSMENT AND PLAN / ED COURSE  I reviewed the triage vital signs and the nursing notes.                              Patient's presentation is most consistent with acute presentation with potential threat to life or bodily function.  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  Patient was admitted recently where she was found to have hemoglobin of 4.5.  He was seen by GI and had a flex sig with no blood in the rectosigmoid area but significant hemorrhoids normal and grade 4.  It was felt that banding was contraindicated and IR embolization was the best next step.  She is following with transplant at [facility] had an appointment today and had hemoglobin of 5.5.  Patient does note that since discharge she has had ongoing rectal bleeding but has slowed down some.  Does have several episodes per day of blood filling the toilet.  Feels somewhat fatigued otherwise not having acute symptoms.  She looks pale blood pressure is borderline low.  She does have prolapsed hemorrhoids on exam and gross blood.  Hemoglobin is 5.1.  Given ongoing bleeding and significant drop in hemoglobin will admit to the hospitalist service.  Will transfuse 2 units.         FINAL CLINICAL IMPRESSION(S) / ED DIAGNOSES   Final diagnoses:  Anemia, unspecified type  Rectal bleeding     Rx / DC Orders   ED Discharge Orders     None        Note:  This document was prepared using Dragon voice recognition software and may include unintentional dictation errors.   [name], MD 03/05/22 Lurline Hare

## 2022-03-05 NOTE — Assessment & Plan Note (Deleted)
Sliding scale insulin coverage 

## 2022-03-05 NOTE — ED Notes (Signed)
Attempted IV start, unsucessful. Order placed for IV team with lab draw.

## 2022-03-05 NOTE — Assessment & Plan Note (Signed)
Continue sertraline

## 2022-03-05 NOTE — Assessment & Plan Note (Addendum)
Likely secondary to cirrhosis.  Last platelet count 117.

## 2022-03-05 NOTE — Assessment & Plan Note (Addendum)
Followed by hematology.  Continue to monitor hemoglobin as outpatient on a weekly basis.

## 2022-03-05 NOTE — Assessment & Plan Note (Addendum)
Creatinine 1.54 today with a GFR of 37

## 2022-03-06 ENCOUNTER — Encounter: Payer: Self-pay | Admitting: Internal Medicine

## 2022-03-06 ENCOUNTER — Other Ambulatory Visit (HOSPITAL_COMMUNITY): Payer: Self-pay

## 2022-03-06 DIAGNOSIS — N1832 Chronic kidney disease, stage 3b: Secondary | ICD-10-CM | POA: Diagnosis not present

## 2022-03-06 DIAGNOSIS — D696 Thrombocytopenia, unspecified: Secondary | ICD-10-CM | POA: Diagnosis not present

## 2022-03-06 DIAGNOSIS — G4733 Obstructive sleep apnea (adult) (pediatric): Secondary | ICD-10-CM

## 2022-03-06 DIAGNOSIS — D563 Thalassemia minor: Secondary | ICD-10-CM

## 2022-03-06 DIAGNOSIS — K7460 Unspecified cirrhosis of liver: Secondary | ICD-10-CM

## 2022-03-06 DIAGNOSIS — E785 Hyperlipidemia, unspecified: Secondary | ICD-10-CM

## 2022-03-06 DIAGNOSIS — E1169 Type 2 diabetes mellitus with other specified complication: Secondary | ICD-10-CM

## 2022-03-06 DIAGNOSIS — F418 Other specified anxiety disorders: Secondary | ICD-10-CM

## 2022-03-06 DIAGNOSIS — D62 Acute posthemorrhagic anemia: Secondary | ICD-10-CM | POA: Diagnosis not present

## 2022-03-06 DIAGNOSIS — D649 Anemia, unspecified: Secondary | ICD-10-CM

## 2022-03-06 DIAGNOSIS — Z9989 Dependence on other enabling machines and devices: Secondary | ICD-10-CM

## 2022-03-06 DIAGNOSIS — D500 Iron deficiency anemia secondary to blood loss (chronic): Secondary | ICD-10-CM

## 2022-03-06 DIAGNOSIS — K7581 Nonalcoholic steatohepatitis (NASH): Secondary | ICD-10-CM | POA: Diagnosis not present

## 2022-03-06 LAB — BASIC METABOLIC PANEL
Anion gap: 4 — ABNORMAL LOW (ref 5–15)
BUN: 34 mg/dL — ABNORMAL HIGH (ref 8–23)
CO2: 21 mmol/L — ABNORMAL LOW (ref 22–32)
Calcium: 7.9 mg/dL — ABNORMAL LOW (ref 8.9–10.3)
Chloride: 113 mmol/L — ABNORMAL HIGH (ref 98–111)
Creatinine, Ser: 1.66 mg/dL — ABNORMAL HIGH (ref 0.44–1.00)
GFR, Estimated: 34 mL/min — ABNORMAL LOW (ref >60)
Glucose, Bld: 155 mg/dL — ABNORMAL HIGH (ref 70–99)
Potassium: 3.7 mmol/L (ref 3.5–5.1)
Sodium: 138 mmol/L (ref 135–145)

## 2022-03-06 LAB — CBC
HCT: 23.8 % — ABNORMAL LOW (ref 36.0–46.0)
Hemoglobin: 7.6 g/dL — ABNORMAL LOW (ref 12.0–15.0)
MCH: 25.2 pg — ABNORMAL LOW (ref 26.0–34.0)
MCHC: 31.9 g/dL (ref 30.0–36.0)
MCV: 78.8 fL — ABNORMAL LOW (ref 80.0–100.0)
Platelets: 108 10*3/uL — ABNORMAL LOW (ref 150–400)
RBC: 3.02 MIL/uL — ABNORMAL LOW (ref 3.87–5.11)
RDW: 21.6 % — ABNORMAL HIGH (ref 11.5–15.5)
WBC: 4.6 10*3/uL (ref 4.0–10.5)
nRBC: 0.4 % — ABNORMAL HIGH (ref 0.0–0.2)

## 2022-03-06 LAB — GLUCOSE, CAPILLARY
Glucose-Capillary: 121 mg/dL — ABNORMAL HIGH (ref 70–99)
Glucose-Capillary: 210 mg/dL — ABNORMAL HIGH (ref 70–99)
Glucose-Capillary: 207 mg/dL — ABNORMAL HIGH (ref 70–99)

## 2022-03-06 LAB — CBG MONITORING, ED
Glucose-Capillary: 137 mg/dL — ABNORMAL HIGH (ref 70–99)
Glucose-Capillary: 145 mg/dL — ABNORMAL HIGH (ref 70–99)
Glucose-Capillary: 162 mg/dL — ABNORMAL HIGH (ref 70–99)
Glucose-Capillary: 186 mg/dL — ABNORMAL HIGH (ref 70–99)

## 2022-03-06 LAB — HEMOGLOBIN: Hemoglobin: 6.6 g/dL — ABNORMAL LOW (ref 12.0–15.0)

## 2022-03-06 MED ORDER — SODIUM CHLORIDE 0.9 % IV SOLN
400.0000 mg | Freq: Once | INTRAVENOUS | Status: DC
  Filled 2022-03-06: qty 20

## 2022-03-06 MED ORDER — SODIUM CHLORIDE 0.9 % IV SOLN
200.0000 mg | Freq: Once | INTRAVENOUS | Status: AC
  Administered 2022-03-06: 200 mg via INTRAVENOUS
  Filled 2022-03-06: qty 200

## 2022-03-06 MED ORDER — WITCH HAZEL-GLYCERIN EX PADS
MEDICATED_PAD | CUTANEOUS | Status: DC | PRN
Start: 2022-03-06 — End: 2022-03-07

## 2022-03-06 MED ORDER — HYDROCORTISONE (PERIANAL) 2.5 % EX CREA
TOPICAL_CREAM | Freq: Four times a day (QID) | CUTANEOUS | Status: DC
  Filled 2022-03-06 (×2): qty 28.35

## 2022-03-06 MED ORDER — SODIUM CHLORIDE 0.9% IV SOLUTION: Freq: Once | INTRAVENOUS | Status: AC

## 2022-03-06 MED ORDER — BLISTEX MEDICATED EX OINT
1.0000 | TOPICAL_OINTMENT | CUTANEOUS | Status: DC | PRN
  Administered 2022-03-06: 1 via TOPICAL
  Filled 2022-03-06: qty 6.3

## 2022-03-06 NOTE — Consult Note (Signed)
[facility] GI Inpatient Consult Note   [name], M.D.  Reason for Consult: Rectal bleeding, hemorrhoids   Attending Requesting Consult: Judd Gaudier, M.D  History of Present Illness: Rachel [facility] is a 66 y.o. female with a history of [facility] cirrhosis, diuretic resistant ascites, chronic renal insufficiency, type 2 diabetes mellitus and obesity who presented to the emergency room yesterday for rectal bleeding which has been chronic.  Patient is seen the nurse practitioner at [facility] and underwent blood work.  She was called with hemoglobin of 5.5 and directed to the emergency room here at [facility].  She was recently admitted in June 2023 for profuse rectal bleeding and was evaluated by Dr. Haig Prophet of our group at the time.  Flexible sigmoidoscopy showed quite large internal hemorrhoids questionable rectal varices and the interventional radiology service was called.  Patient underwent coil hemostasis by interventional radiology and was improved and discharged.  Patient says her bleeding has improved markedly since that time but she still has some occasional bleeding particularly after bowel movements.  She has a history of beta thalassemia and has a chronically low hemoglobin anyway with her admitting hemoglobin was more than 2 g below her baseline.  We have been asked to see the patient to evaluate rectal bleeding in the setting of known cirrhosis and hemorrhoids.  Past Medical History:  Past Medical History:  Diagnosis Date   Anxiety    Arthritis    Cirrhosis of liver ([place])    Diabetes mellitus without complication (HCC)    Dyspnea    DOE   GERD (gastroesophageal reflux disease)    Hepatitis    PAST   Hypertension    Neuropathy    Neuropathy, diabetic ([place])    Pneumonia    Sinus complaint    Sleep apnea    CPAP   Thalassemia minor 1992    Problem List: Patient Active Problem List   Diagnosis Date Noted   Acute on chronic blood loss anemia 03/05/2022    Hematochezia 03/05/2022   Thrombocytopenia ([place]) 03/05/2022   Fever 02/08/2022   Pancytopenia ([place]) 02/07/2022   GI bleeding 02/04/2022   Chronic kidney disease, stage 3b ([place]) 02/04/2022   Uncontrolled type 2 diabetes mellitus with hyperglycemia, without long-term current use of insulin ([place]) 02/04/2022   Depression with anxiety 02/04/2022   Liver cirrhosis secondary to NASH ([place])    Acute GI hemorrhage 01/28/2022   AKI (acute kidney injury) ([place]) 01/27/2022   Insomnia 01/27/2022   Iron deficiency anemia due to chronic blood loss 02/13/2021   History of uterine cancer 02/13/2021   Decompensated hepatic cirrhosis ([place]) 10/25/2020   OSA on CPAP 10/25/2020   Glossitis 10/25/2020   Hx of bleeding grade IV hemorrhoids s/p coil embolization by IR 02/07/22    Acute blood loss anemia 10/08/2020   Hyponatremia 10/08/2020   Rectal bleeding 10/08/2020   Acute respiratory failure with hypoxia ([place]) 09/10/2020   Pneumonia due to COVID-19 virus 09/08/2020   Hip fracture, right ([place]) 09/04/2020   Closed right hip fracture ([place]) 09/02/2020   Diabetes mellitus, controlled ([place]) 09/02/2020   Thalassemia minor    HLD (hyperlipidemia)    Acute kidney injury superimposed on CKD ([place])    Depression    Fall at home, initial encounter    Nondisplaced fracture of greater trochanter of right femur, initial encounter for closed fracture [facility])     Past Surgical History: Past Surgical History:  Procedure Laterality Date   ABDOMINAL HYSTERECTOMY  BREAST BIOPSY Right 02/15/2018   [place] bx 6-6:30 ribbon shape, ONE CORE FRAGMENT WITH FIBROSIS. ONE CORE FRAGMENT WITH PORTION OF A DILATED   BREAST BIOPSY Right 02/15/2018   [place] bx 9:00 heart shape, USUAL DUCTAL HYPERPLASIA   BREAST LUMPECTOMY Right 03/09/2018   Procedure: BREAST LUMPECTOMY x 2;  Surgeon: Benjamine Sprague, DO;  Location: [facility] ORS;  Service: General;  Laterality: Right;   CATARACT EXTRACTION W/PHACO Left 06/11/2016   Procedure: CATARACT EXTRACTION  PHACO AND INTRAOCULAR LENS PLACEMENT (IOC);  Surgeon: Estill Cotta, MD;  Location: [facility] ORS;  Service: Ophthalmology;  Laterality: Left;  Lot # [id number] H US:01:38.6 AP%:26.4 CDE:44.15   CATARACT EXTRACTION W/PHACO Right 06/15/2018   Procedure: CATARACT EXTRACTION PHACO AND INTRAOCULAR LENS PLACEMENT (IOC);  Surgeon: Birder Robson, MD;  Location: [facility] ORS;  Service: Ophthalmology;  Laterality: Right;  [place] 00:38.2 CDE 4.23 Fluid Pack Lot # [id number]   COLONOSCOPIES     COLONOSCOPY WITH PROPOFOL N/A 10/10/2020   Procedure: COLONOSCOPY WITH PROPOFOL;  Surgeon: Lesly Rubenstein, MD;  Location: [facility] ENDOSCOPY;  Service: Endoscopy;  Laterality: N/A;   COLONOSCOPY WITH PROPOFOL N/A 11/20/2020   Procedure: COLONOSCOPY WITH PROPOFOL;  Surgeon: Lesly Rubenstein, MD;  Location: [facility] ENDOSCOPY;  Service: Endoscopy;  Laterality: N/A;  DM STAT CBC, BMP COVID POSITIVE 09/02/2020   CTR     ESOPHAGOGASTRODUODENOSCOPY (EGD) WITH PROPOFOL N/A 10/10/2020   Procedure: ESOPHAGOGASTRODUODENOSCOPY (EGD) WITH PROPOFOL;  Surgeon: Lesly Rubenstein, MD;  Location: [facility] ENDOSCOPY;  Service: Endoscopy;  Laterality: N/A;  COVID POSITIVE 10/08/2020   FLEXIBLE SIGMOIDOSCOPY N/A 02/06/2022   Procedure: FLEXIBLE SIGMOIDOSCOPY;  Surgeon: Lesly Rubenstein, MD;  Location: [facility] ENDOSCOPY;  Service: Endoscopy;  Laterality: N/A;  Patient requests anesthesia   IR ANGIOGRAM SELECTIVE EACH ADDITIONAL VESSEL  02/07/2022   IR ANGIOGRAM SELECTIVE EACH ADDITIONAL VESSEL  02/07/2022   IR ANGIOGRAM VISCERAL SELECTIVE  02/07/2022   IR EMBO ARTERIAL NOT HEMORR HEMANG INC GUIDE ROADMAPPING  02/07/2022   IR PARACENTESIS  12/17/2021   IR US GUIDE VASC ACCESS RIGHT  02/07/2022   JOINT REPLACEMENT     KNEE SURGERY Right 09/24/2017   plates and pins   TOTAL SHOULDER ARTHROPLASTY Right 09/27/2015    Allergies: Allergies  Allergen Reactions   Gramineae Pollens Other (See Comments)    Sneezing, Running nose   Latex Rash    Contact rash     Home Medications: (Not in a hospital admission)  Home medication reconciliation was completed with the patient.   Scheduled Inpatient Medications:    atorvastatin  10 mg Oral Daily   gabapentin  800 mg Oral Q0200   insulin aspart  0-20 Units Subcutaneous Q4H   lactulose  20 g Oral Daily   sertraline  50 mg Oral Daily   vitamin B-12  1,000 mcg Oral Daily    Continuous Inpatient Infusions:    sodium chloride 75 mL/hr at 03/06/22 0612    PRN Inpatient Medications:  acetaminophen **OR** acetaminophen, albuterol, diphenhydrAMINE, melatonin, morphine injection, ondansetron **OR** ondansetron (ZOFRAN) IV, sodium chloride  Family History: family history includes Breast cancer (age of onset: 29) in her mother; Breast cancer (age of onset: 54) in her sister; Diabetes in her father and sister; Heart disease in her father; Kidney cancer in her father; Lymphoma in her mother.   GI Family History: Negative  Social History:   reports that she has never smoked. She has never used smokeless tobacco. She reports that she does not drink alcohol and does not use drugs. The  patient denies ETOH, tobacco, or drug use.    Review of Systems: Review of Systems - General ROS: positive for  - weight loss negative for - fatigue, hot flashes, night sweats, or weight gain Psychological ROS: negative Ophthalmic ROS: negative ENT ROS: negative Allergy and Immunology ROS: negative for - nasal congestion or seasonal allergies Hematological and Lymphatic ROS: negative Endocrine ROS: positive for - unexpected weight changes negative for - breast changes, galactorrhea, or hot flashes Respiratory ROS: no cough, shortness of breath, or wheezing Cardiovascular ROS: no chest pain or dyspnea on exertion Genito-Urinary ROS: no dysuria, trouble voiding, or hematuria Musculoskeletal ROS: positive for - joint stiffness, joint swelling, muscular weakness, and swelling in ankle - bilateral negative for - muscle  pain Neurological ROS: no TIA or stroke symptoms Dermatological ROS: negative  Physical Examination: BP (!) 106/59   Pulse 88   Temp 97.9 F (36.6 C) (Oral)   Resp 14   Ht 5' 9"  (1.753 m)   Wt 121.8 kg   SpO2 95%   BMI 39.65 kg/m  Physical Exam Constitutional:      General: She is not in acute distress.    Appearance: She is obese. She is ill-appearing. She is not toxic-appearing or diaphoretic.  HENT:     Head: Normocephalic and atraumatic.     Nose: Nose normal.  Eyes:     Pupils: Pupils are equal, round, and reactive to light.  Cardiovascular:     Rate and Rhythm: Normal rate.     Pulses: Normal pulses.  Pulmonary:     Effort: Pulmonary effort is normal. No respiratory distress.     Breath sounds: No stridor. No wheezing or rhonchi.  Abdominal:     General: There is distension.  Musculoskeletal:     Cervical back: Normal range of motion.  Skin:    General: Skin is warm and dry.     Capillary Refill: Capillary refill takes less than 2 seconds.  Neurological:     Mental Status: She is alert and oriented to person, place, and time.     Comments: Some mild aphasia  Psychiatric:        Mood and Affect: Mood normal.        Thought Content: Thought content normal.        Judgment: Judgment normal.     Data: Lab Results  Component Value Date   WBC 4.6 03/06/2022   HGB 7.6 (L) 03/06/2022   HCT 23.8 (L) 03/06/2022   MCV 78.8 (L) 03/06/2022   PLT 108 (L) 03/06/2022   Recent Labs  Lab 03/05/22 1639 03/06/22 0104 03/06/22 0533  HGB 5.1* 6.6* 7.6*   Lab Results  Component Value Date   NA 138 03/06/2022   K 3.7 03/06/2022   CL 113 (H) 03/06/2022   CO2 21 (L) 03/06/2022   BUN 34 (H) 03/06/2022   CREATININE 1.66 (H) 03/06/2022   Lab Results  Component Value Date   ALT 24 03/05/2022   AST 42 (H) 03/05/2022   ALKPHOS 111 03/05/2022   BILITOT 1.5 (H) 03/05/2022   Recent Labs  Lab 03/05/22 1650  INR 1.2      Latest Ref Rng & Units 03/06/2022    5:33  AM 03/06/2022    1:04 AM 03/05/2022    4:39 PM  CBC  WBC 4.0 - 10.5 K/uL 4.6   4.2   Hemoglobin 12.0 - 15.0 g/dL 7.6  6.6  5.1   Hematocrit 36.0 - 46.0 % 23.8  17.0   Platelets 150 - 400 K/uL 108   127     STUDIES: US Paracentesis  Result Date: 03/04/2022 INDICATION: Patient with history of NASH cirrhosis and recurrent ascites status post tips procedure at [facility], maximum volume request of 8 L to be drawn off per ordering provider. EXAM: ULTRASOUND GUIDED  PARACENTESIS MEDICATIONS: 1% lidocaine only. COMPLICATIONS: None immediate. PROCEDURE: Informed written consent was obtained from the patient after a discussion of the risks, benefits and alternatives to treatment. A timeout was performed prior to the initiation of the procedure. Initial ultrasound scanning demonstrates a large amount of ascites within the right upper abdominal quadrant. The right upper abdomen was prepped and draped in the usual sterile fashion. 1% lidocaine was used for local anesthesia. Following this, a 19 gauge, 10-cm, Yueh catheter was introduced. An ultrasound image was saved for documentation purposes. The paracentesis was performed. The catheter was removed and a dressing was applied. The patient tolerated the procedure well without immediate post procedural complication. Patient received post-procedure intravenous albumin; see nursing notes for details. FINDINGS: A total of approximately 7 Liters of clear yellow fluid was removed. IMPRESSION: Successful ultrasound-guided paracentesis yielding 7 liters of peritoneal fluid. PLAN: The patient has reportedly had a TIPS procedure at [facility] on 12/26/2021 and is awaiting liver transplant there, she is followed by the liver clinic at [facility]. This exam was performed by Tsosie Billing PA-C, and was supervised and interpreted by Dr. Annamaria Boots Electronically Signed   By: Jerilynn Mages.  Shick M.D.   On: 03/04/2022 12:35   @IMAGES @  Assessment:  [facility] Cirrhosis with MELD-Na Score of 13.  Clinically stable from a liver standpoint. Hematochezia - Chronic, persistent. Presumably from hemorrhoids, rectal varices. Patient not taking beta blocker Obesity Uncontrolled Type II DM.  COVID-19 status: NEGATIVE     Recommendations:  Serial H/H. Transfuse. Consider repeat inpatient intervention radiology consult for any worsening bleeding. Considering bleeding is only intermittent, would favor definitive management at [facility] - GI/Hepatology. Will follow  Thank you for the consult. Please call with questions or concerns.  [facility], "Rachel Hurst MD [facility] [place], [place] [address] [phone]  03/06/2022 9:09 AM

## 2022-03-06 NOTE — Progress Notes (Signed)
03/06/2022 at 2100:  Pt CBG reading was 210. Pt refused HS insulin.

## 2022-03-06 NOTE — Progress Notes (Signed)
   03/06/22 2110  Mobility  HOB Elevated/Bed Position HOB greater than 30  Activity Ambulated independently in room;Ambulated independently to bathroom  Range of Motion/Exercises Active;All extremities  Level of Assistance Independent after set-up  Assistive Device Cane  Distance Ambulated (ft) 20 ft  Activity Response Tolerated well  Transport method Wheelchair   Pt abmulatory/independent with cane. Stand by for safety/ No contact

## 2022-03-06 NOTE — Progress Notes (Signed)
  Progress Note   Patient: Rachel [facility]er FIE:[id number] DOB: 12/15/1955 DOA: 03/05/2022     1 DOS: the patient was seen and examined on 03/06/2022     Assessment and Plan: * Acute on chronic blood loss anemia Hematochezia, with ongoing hemorrhoidal bleeding History of bleeding grade 4 hemorrhoids s/p bilateral superior rectal artery particle and coil embolization on 02/07/2022 History of large-volume transfusion for hemorrhoidal bleed June 2023, 4.5 units PRBC  Discharge hemoglobin was 8.9 but baseline around 7.  Hemoglobin today is 5.1. Status post 2 units of packed red blood cells and hemoglobin came up to 7.6.  We will give 400 mg of IV Venofer today.  Recheck hemoglobin tomorrow.  Spoke with interventional radiology to evaluate patient.  Case discussed with gastroenterology.  We will give Anusol cream and witch hazel.  Liver cirrhosis secondary to NASH ([place]) Appears mostly compensated. On transplant list and saw her doctors at [facility] earlier in the day on 7/19 S/p TIPS 12-26-2021 Weekly paracentesis Continue lactulose daily.  Not currently on Lasix, spironolactone because of kidney function  Thrombocytopenia ([place]) Likely secondary to cirrhosis.  Last platelet count 108.  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  Continue to monitor hemoglobin  Depression with anxiety Continue sertraline  Type 2 diabetes mellitus with hyperlipidemia (HCC) Last hemoglobin A1c low at 5.7.  On low-dose Lipitor.        Subjective: Patient states that she has been having some intermittent rib pain with the hemorrhoids.  Admitted again with a hemoglobin of 5.3.  Was transfused yesterday and hemoglobin came up to 7.6.  Had some blood in the toilet today as per nursing staff.  Physical Exam: Vitals:   03/06/22 0956 03/06/22 1058 03/06/22 1100 03/06/22 1245  BP:  (!) 100/50 (!) 101/53 (!) 104/48  Pulse:  80 80 91   Resp:  13 14 15   Temp: 98 F (36.7 C)     TempSrc:      SpO2:  97% 96% 99%  Weight:      Height:       Physical Exam HENT:     Head: Normocephalic.     Mouth/Throat:     Pharynx: No oropharyngeal exudate.  Eyes:     General: Lids are normal.     Conjunctiva/sclera: Conjunctivae normal.  Cardiovascular:     Rate and Rhythm: Normal rate and regular rhythm.     Heart sounds: Normal heart sounds, S1 normal and S2 normal.  Pulmonary:     Breath sounds: No decreased breath sounds, wheezing, rhonchi or rales.  Abdominal:     Palpations: Abdomen is soft.     Tenderness: There is no abdominal tenderness.  Musculoskeletal:     Right lower leg: Swelling present.     Left lower leg: Swelling present.  Skin:    General: Skin is warm.  Neurological:     Mental Status: She is alert and oriented to person, place, and time.     Data Reviewed: Creatinine 1.66, glucose 155, hemoglobin 7.6, platelet count 108  Disposition: Status is: Inpatient Remains inpatient appropriate because: Need to make sure hemoglobin stabilizes before any disposition.  Giving IV iron today  Planned Discharge Destination: Home    Time spent: 28 minutes  Author: Loletha Grayer, MD 03/06/2022 4:09 PM  For on call review [email or web address].

## 2022-03-06 NOTE — ED Notes (Signed)
Patient noted to have BM with blood noted in toilet.

## 2022-03-06 NOTE — H&P (Signed)
Chief Complaint: Patient was seen in consultation today for rectal bleeding  at the request of Loletha Grayer, MD Referring Physician(s): Loletha Grayer, MD  Supervising Physician: Markus Daft  Patient Status: [facility] - In-pt  History of Present Illness: Rachel Huber is a 66 y.o. female with past medical history significant for NASH cirrhosis with portal hypertension, status post TIPS May 2023 at [facility], HTN, DM type II, depression, thalassemia minor, OSA on CPAP and history of bleeding hemorrhoids requiring transfusion. Pt is well-known to IR service having underwent particle and coil embolization of rectal artery 02/07/2022 with Dr. Serafina Royals. Patient was sent to ED 03/05/22 for evaluation of hemoglobin of 5.5 checked on a routine follow-up visit. Patient referred to IR for evaluation of rectal bleeding by Dr. Leslye Peer.   Past Medical History:  Diagnosis Date   Anxiety    Arthritis    Cirrhosis of liver ([place])    Diabetes mellitus without complication (HCC)    Dyspnea    DOE   GERD (gastroesophageal reflux disease)    Hepatitis    PAST   Hypertension    Neuropathy    Neuropathy, diabetic ([place])    Pneumonia    Sinus complaint    Sleep apnea    CPAP   Thalassemia minor 1992    Past Surgical History:  Procedure Laterality Date   ABDOMINAL HYSTERECTOMY     BREAST BIOPSY Right 02/15/2018   [place] bx 6-6:30 ribbon shape, ONE CORE FRAGMENT WITH FIBROSIS. ONE CORE FRAGMENT WITH PORTION OF A DILATED   BREAST BIOPSY Right 02/15/2018   [place] bx 9:00 heart shape, USUAL DUCTAL HYPERPLASIA   BREAST LUMPECTOMY Right 03/09/2018   Procedure: BREAST LUMPECTOMY x 2;  Surgeon: Benjamine Sprague, DO;  Location: [facility] ORS;  Service: General;  Laterality: Right;   CATARACT EXTRACTION W/PHACO Left 06/11/2016   Procedure: CATARACT EXTRACTION PHACO AND INTRAOCULAR LENS PLACEMENT (IOC);  Surgeon: Estill Cotta, MD;  Location: [facility] ORS;  Service: Ophthalmology;  Laterality: Left;  Lot #  [id number] H US:01:38.6 AP%:26.4 CDE:44.15   CATARACT EXTRACTION W/PHACO Right 06/15/2018   Procedure: CATARACT EXTRACTION PHACO AND INTRAOCULAR LENS PLACEMENT (IOC);  Surgeon: Birder Robson, MD;  Location: [facility] ORS;  Service: Ophthalmology;  Laterality: Right;  [place] 00:38.2 CDE 4.23 Fluid Pack Lot # [id number]   COLONOSCOPIES     COLONOSCOPY WITH PROPOFOL N/A 10/10/2020   Procedure: COLONOSCOPY WITH PROPOFOL;  Surgeon: Lesly Rubenstein, MD;  Location: [facility] ENDOSCOPY;  Service: Endoscopy;  Laterality: N/A;   COLONOSCOPY WITH PROPOFOL N/A 11/20/2020   Procedure: COLONOSCOPY WITH PROPOFOL;  Surgeon: Lesly Rubenstein, MD;  Location: [facility] ENDOSCOPY;  Service: Endoscopy;  Laterality: N/A;  DM STAT CBC, BMP COVID POSITIVE 09/02/2020   CTR     ESOPHAGOGASTRODUODENOSCOPY (EGD) WITH PROPOFOL N/A 10/10/2020   Procedure: ESOPHAGOGASTRODUODENOSCOPY (EGD) WITH PROPOFOL;  Surgeon: Lesly Rubenstein, MD;  Location: [facility] ENDOSCOPY;  Service: Endoscopy;  Laterality: N/A;  COVID POSITIVE 10/08/2020   FLEXIBLE SIGMOIDOSCOPY N/A 02/06/2022   Procedure: FLEXIBLE SIGMOIDOSCOPY;  Surgeon: Lesly Rubenstein, MD;  Location: [facility] ENDOSCOPY;  Service: Endoscopy;  Laterality: N/A;  Patient requests anesthesia   IR ANGIOGRAM SELECTIVE EACH ADDITIONAL VESSEL  02/07/2022   IR ANGIOGRAM SELECTIVE EACH ADDITIONAL VESSEL  02/07/2022   IR ANGIOGRAM VISCERAL SELECTIVE  02/07/2022   IR EMBO ARTERIAL NOT HEMORR HEMANG INC GUIDE ROADMAPPING  02/07/2022   IR PARACENTESIS  12/17/2021   IR US GUIDE VASC ACCESS RIGHT  02/07/2022   JOINT REPLACEMENT     KNEE SURGERY  Right 09/24/2017   plates and pins   TOTAL SHOULDER ARTHROPLASTY Right 09/27/2015    Allergies: Gramineae pollens and Latex  Medications: Prior to Admission medications   Medication Sig Start Date End Date Taking? Authorizing Provider  atorvastatin (LIPITOR) 10 MG tablet Take 10 mg by mouth daily. 01/28/21  Yes [provider]  Cholecalciferol 50 MCG (2000  UT) CAPS Take 2,000 Units by mouth daily.   Yes [provider]  gabapentin (NEURONTIN) 400 MG capsule Take 800 mg by mouth 2 (two) times daily. 01/07/18  Yes [provider]  Multiple Vitamins-Minerals (MULTIVITAMIN WITH MINERALS) tablet Take 1 tablet by mouth daily.   Yes [provider]  sertraline (ZOLOFT) 50 MG tablet Take 50 mg by mouth daily. 12/12/17  Yes [provider]  vitamin B-12 (CYANOCOBALAMIN) 1000 MCG tablet Take 1,000 mcg by mouth daily.   Yes [provider]  acetaminophen (TYLENOL) 650 MG CR tablet Take 1,300 mg by mouth daily as needed for pain.    [provider]  albuterol (VENTOLIN HFA) 108 (90 Base) MCG/ACT inhaler Inhale 2 puffs into the lungs every 6 (six) hours as needed for wheezing or shortness of breath. 02/10/22   Loletha Grayer, MD  diphenhydrAMINE (BENADRYL) 25 MG tablet Take 25 mg by mouth 2 (two) times daily as needed for itching.    [provider]  guaiFENesin-dextromethorphan (ROBITUSSIN DM) 100-10 MG/5ML syrup Take 5 mLs by mouth every 4 (four) hours as needed for cough. Patient not taking: Reported on 03/05/2022 09/13/20   Aline August, MD  hydrocortisone (ANUSOL-HC) 25 MG suppository Place 1 suppository (25 mg total) rectally 2 (two) times daily. 01/30/22   Roxan Hockey, MD  lactulose, encephalopathy, (CHRONULAC) 10 GM/15ML SOLN Take 30 mLs (20 g total) by mouth daily. 01/30/22   Roxan Hockey, MD  melatonin 5 MG TABS Take 10 mg by mouth at bedtime as needed (sleep).    [provider]  ondansetron (ZOFRAN-ODT) 4 MG disintegrating tablet Take 1 tablet (4 mg total) by mouth every 8 (eight) hours as needed for nausea or vomiting. 01/30/22   Denton Brick, Courage, MD  pantoprazole (PROTONIX) 40 MG tablet Take 1 tablet (40 mg total) by mouth daily. Patient not taking: Reported on 03/05/2022 01/30/22 01/30/23  Roxan Hockey, MD  sodium chloride (OCEAN) 0.65 % SOLN nasal spray Place 1 spray into  both nostrils as needed for congestion. 02/10/22   Loletha Grayer, MD     Family History  Problem Relation Age of Onset   Breast cancer Mother 79   Lymphoma Mother    Diabetes Father    Kidney cancer Father    Heart disease Father    Diabetes Sister    Breast cancer Sister 51    Social History   Socioeconomic History   Marital status: Married    Spouse name: Not on file   Number of children: 0   Years of education: Not on file   Highest education level: Not on file  Occupational History   Occupation: EMPLOYED  Tobacco Use   Smoking status: Never   Smokeless tobacco: Never  Vaping Use   Vaping Use: Never used  Substance and Sexual Activity   Alcohol use: No   Drug use: Never   Sexual activity: Not Currently  Other Topics Concern   Not on file  Social History Narrative   Not on file   Social Determinants of Health   Financial Resource Strain: Low Risk  (01/31/2021)   Overall Financial Resource  Strain (CARDIA)    Difficulty of Paying Living Expenses: Not hard at all  Food Insecurity: No Food Insecurity (01/31/2021)   Hunger Vital Sign    Worried About Running Out of Food in the Last Year: Never true    Ran Out of Food in the Last Year: Never true  Transportation Needs: No Transportation Needs (01/31/2021)   PRAPARE - Hydrologist (Medical): No    Lack of Transportation (Non-Medical): No  Physical Activity: Inactive (01/31/2021)   Exercise Vital Sign    Days of Exercise per Week: 0 days    Minutes of Exercise per Session: 0 min  Stress: No Stress Concern Present (01/31/2021)   [place]    Feeling of Stress : Not at all  Social Connections: Moderately Isolated (01/31/2021)   Social Connection and Isolation Panel [NHANES]    Frequency of Communication with Friends and Family: More than three times a week    Frequency of Social Gatherings with Friends and Family: Once a  week    Attends Religious Services: Never    Marine scientist or Organizations: No    Attends Archivist Meetings: Never    Marital Status: Married     Review of Systems: A 12 point ROS discussed and pertinent positives are indicated in the HPI above.  All other systems are negative.  Review of Systems  Constitutional:  Positive for fatigue.  Gastrointestinal:  Positive for anal bleeding and rectal pain.    Vital Signs: BP (!) 104/48   Pulse 91   Temp 98 F (36.7 C)   Resp 15   Ht 5' 9"  (1.753 m)   Wt 268 lb 8.3 oz (121.8 kg)   SpO2 99%   BMI 39.65 kg/m     Physical Exam Vitals reviewed.  Constitutional:      General: She is not in acute distress.    Appearance: Normal appearance. She is not ill-appearing.  HENT:     Head: Normocephalic and atraumatic.  Eyes:     Extraocular Movements: Extraocular movements intact.     Pupils: Pupils are equal, round, and reactive to light.  Pulmonary:     Effort: Pulmonary effort is normal. No respiratory distress.  Abdominal:     General: There is distension.     Tenderness: There is no abdominal tenderness. There is no guarding.  Skin:    General: Skin is warm and dry.  Neurological:     Mental Status: She is alert and oriented to person, place, and time.  Psychiatric:        Mood and Affect: Mood normal.        Behavior: Behavior normal.        Thought Content: Thought content normal.        Judgment: Judgment normal.     Imaging: US Paracentesis  Result Date: 03/04/2022 INDICATION: Patient with history of NASH cirrhosis and recurrent ascites status post tips procedure at [facility], maximum volume request of 8 L to be drawn off per ordering provider. EXAM: ULTRASOUND GUIDED  PARACENTESIS MEDICATIONS: 1% lidocaine only. COMPLICATIONS: None immediate. PROCEDURE: Informed written consent was obtained from the patient after a discussion of the risks, benefits and alternatives to treatment. A timeout was performed  prior to the initiation of the procedure. Initial ultrasound scanning demonstrates a large amount of ascites within the right upper abdominal quadrant. The right upper abdomen was prepped and draped in the  usual sterile fashion. 1% lidocaine was used for local anesthesia. Following this, a 19 gauge, 10-cm, Yueh catheter was introduced. An ultrasound image was saved for documentation purposes. The paracentesis was performed. The catheter was removed and a dressing was applied. The patient tolerated the procedure well without immediate post procedural complication. Patient received post-procedure intravenous albumin; see nursing notes for details. FINDINGS: A total of approximately 7 Liters of clear yellow fluid was removed. IMPRESSION: Successful ultrasound-guided paracentesis yielding 7 liters of peritoneal fluid. PLAN: The patient has reportedly had a TIPS procedure at [facility] on 12/26/2021 and is awaiting liver transplant there, she is followed by the liver clinic at [facility]. This exam was performed by Tsosie Billing PA-C, and was supervised and interpreted by Dr. Annamaria Boots Electronically Signed   By: Jerilynn Mages.  Shick M.D.   On: 03/04/2022 12:35   US Paracentesis  Result Date: 02/25/2022 INDICATION: Patient with history of NASH cirrhosis and recurrent ascites status post tips procedure at [facility], maximum volume request of 8 L to be drawn off per ordering provider. EXAM: ULTRASOUND GUIDED  PARACENTESIS MEDICATIONS: Local 1% lidocaine only. COMPLICATIONS: None immediate. PROCEDURE: Informed written consent was obtained from the patient after a discussion of the risks, benefits and alternatives to treatment. A timeout was performed prior to the initiation of the procedure. Initial ultrasound scanning demonstrates a large amount of ascites within the right lower abdominal quadrant. The right lower abdomen was prepped and draped in the usual sterile fashion. 1% lidocaine was used for local anesthesia. Following  this, a 19 gauge, 10-cm, Yueh catheter was introduced. An ultrasound image was saved for documentation purposes. The paracentesis was performed. The catheter was removed and a dressing was applied. The patient tolerated the procedure well without immediate post procedural complication. Patient received post-procedure intravenous albumin; see nursing notes for details. FINDINGS: A total of approximately 6.4 L of clear yellow fluid was removed. IMPRESSION: Successful ultrasound-guided paracentesis yielding 6.4 liters of peritoneal fluid. PLAN: The patient has reportedly had a TIPS procedure at [facility] on 12/26/2021 and is awaiting liver transplant there, she is followed by the liver clinic at [facility]. This exam was performed by Tsosie Billing PA-C, and was supervised and interpreted by Dr. Kathlene Cote. Electronically Signed   By: Aletta Edouard M.D.   On: 02/25/2022 12:36   US Paracentesis  Result Date: 02/19/2022 INDICATION: Patient with history of NASH cirrhosis and recurrent ascites status post tips procedure at [facility], maximum volume request of 8 L to be drawn off per ordering provider. EXAM: ULTRASOUND GUIDED  PARACENTESIS MEDICATIONS: Local 1% lidocaine only. COMPLICATIONS: None immediate. PROCEDURE: Informed written consent was obtained from the patient after a discussion of the risks, benefits and alternatives to treatment. A timeout was performed prior to the initiation of the procedure. Initial ultrasound scanning demonstrates a large amount of ascites within the right upper abdominal quadrant. The right upper abdomen was prepped and draped in the usual sterile fashion. 1% lidocaine was used for local anesthesia. Following this, a 19 gauge, 10-cm, Yueh catheter was introduced. An ultrasound image was saved for documentation purposes. The paracentesis was performed. The catheter was removed and a dressing was applied. The patient tolerated the procedure well without immediate post procedural  complication. FINDINGS: A total of approximately 7.5 L of clear yellow fluid was removed. IMPRESSION: Successful ultrasound-guided paracentesis yielding 7.5 L liters of peritoneal fluid. PLAN: The patient has reportedly had a TIPS procedure at [facility] on 12/26/2021 and is awaiting liver transplant  there, she is followed by the liver clinic at [facility]. This exam was performed by Tsosie Billing PA-C, and was supervised and interpreted by Dr. Denna Haggard. Electronically Signed   By: Albin Felling M.D.   On: 02/19/2022 12:49   IR Angiogram Selective Each Additional Vessel  Result Date: 02/11/2022 INDICATION: 66 year old female with history of NASH cirrhosis and portal hypertension status post TIPS creation with recurrent hemorrhagic internal hemorrhoids (grade 4). Poor surgical candidate. EXAM: 1. Ultrasound-guided vascular access of the right common femoral artery. 2. Selective catheterization angiography of the inferior mesenteric artery and bilateral superior rectal arteries. 3. Particle and coil embolization of the bilateral superior rectal arteries. MEDICATIONS: None. ANESTHESIA/SEDATION: Moderate (conscious) sedation was employed during this procedure. A total of Versed 1.5 mg and Fentanyl 75 mcg was administered intravenously. Moderate Sedation Time: 93 minutes. The patient's level of consciousness and vital signs were monitored continuously by radiology nursing throughout the procedure under my direct supervision. CONTRAST:  60m OMNIPAQUE IOHEXOL 350 MG/ML SOLN FLUOROSCOPY: Radiation Exposure Index (as provided by the fluoroscopic device): 5599.3mGy Kerma COMPLICATIONS: None immediate. PROCEDURE: Informed consent was obtained from the patient following explanation of the procedure, risks, benefits and alternatives. The patient understands, agrees and consents for the procedure. All questions were addressed. A time out was performed prior to the initiation of the procedure. Maximal barrier sterile  technique utilized including caps, mask, sterile gowns, sterile gloves, large sterile drape, hand hygiene, and Betadine prep. The right groin was prepped and draped in standard fashion. Preprocedure ultrasound evaluation demonstrated patency of the right common femoral artery. The procedure was planned. Subdermal Local anesthesia was administered with 1% lidocaine at the planned needle entry site. A small dermatotomy was created with an 11 blade. Under direct ultrasound visualization, the right common femoral artery was accessed with a 21 gauge micropuncture needle. A permanent image was captured and stored the record. Micropuncture sheath was introduced and a limited right lower extremity angiogram was performed which demonstrated adequate puncture site for closure device use. A 0.035 inch J wire was then inserted and directed to the abdominal aorta under fluoroscopic guidance. The micropuncture sheath was exchanged for a 5 [place] 10 cm vascular sheath. A rim catheter was then introduced and attempts at selecting the inferior mesenteric artery were made which were unsuccessful. Therefore, the rim catheter was exchanged for a Mickelson catheter which successfully engaged the ostium of the inferior mesenteric artery. Inferior mesenteric angiogram demonstrated widely patent inferior mesenteric artery with hypertrophied bilateral superior rectal arteries which are tortuous distally where there was mild blushing compatible with known internal hemorrhoids. A 2.4 [place] and 0.04 inch fathom microwire were then used to cannulate the right superior rectal artery. Superior rectal angiogram was performed which again demonstrated hypertrophy and tortuosity in the region of the right-sided internal hemorrhoids. Particle embolization was performed with 400 micron Hydrospheres until near stasis was achieved. This was followed by coil embolization with 3 mm and 2 mm 0.018" Azur microcoils. Repeat superior rectal  angiogram demonstrated successful embolization. The microcatheter and wire were then retracted and used to select the left superior rectal artery. Distal left superior rectal angiogram was performed which demonstrated hypertrophy and tortuosity in the region of the left internal hemorrhoids. Particle embolization was performed with 400 micron Hydrospheres until near stasis was achieved. This was followed by coil embolization with 3 mm and 2 mm 0.018" Azur microcoils. Repeat left superior rectal angiogram demonstrated successful embolization. Repeat angiogram from the proximal superior rectal artery was then  performed which demonstrated middle rectal artery branches supplying the internal hemorrhoids., Therefore, proximal bilateral superior rectal artery was coil embolized with 3 mm and 4 mm [place]ur 0.018" microcoils. Completion superior rectal angiogram demonstrated successful embolization of the bilateral superior rectal and middle rectal arteries without evidence of opacification of the previously visualized internal hemorrhoids. The catheters were removed. The right common femoral artery was closed with a 6 French Angio-Seal device successfully. Peripheral pulses were unchanged. The patient tolerated the procedure well was transferred back to the floor in stable condition. IMPRESSION: Technically successful particle and coil embolization of the bilateral superior rectal arteries. Ruthann Cancer, MD [facility] Electronically Signed   By: Ruthann Cancer M.D.   On: 02/11/2022 14:10   US Paracentesis  Result Date: 02/11/2022 INDICATION: Patient with history of NASH cirrhosis and recurrent ascites status post tips procedure at [facility], maximum volume request of 8 L to be drawn off per ordering provider. EXAM: ULTRASOUND GUIDED  PARACENTESIS MEDICATIONS: Local 1% lidocaine only. COMPLICATIONS: None immediate. PROCEDURE: Informed written consent was obtained from  the patient after a discussion of the risks, benefits and alternatives to treatment. A timeout was performed prior to the initiation of the procedure. Initial ultrasound scanning demonstrates a large amount of ascites within the left lower abdominal quadrant. The left lower abdomen was prepped and draped in the usual sterile fashion. 1% lidocaine was used for local anesthesia. Following this, a 19 gauge, 7-cm, Yueh catheter was introduced. An ultrasound image was saved for documentation purposes. The paracentesis was performed. The catheter was removed and a dressing was applied. The patient tolerated the procedure well without immediate post procedural complication. Patient received post-procedure intravenous albumin; see nursing notes for details. FINDINGS: A total of approximately 7.2 L of clear yellow fluid was removed. IMPRESSION: Successful ultrasound-guided paracentesis yielding 7.2 liters of peritoneal fluid. PLAN: The patient has reportedly had a TIPS procedure at [facility] on 12/26/2021 and is awaiting liver transplant there, she is followed by the liver clinic at [facility]. This exam was performed by Tsosie Billing PA-C, and was supervised and interpreted by Dr. Kathlene Cote. Electronically Signed   By: Aletta Edouard M.D.   On: 02/11/2022 14:10   IR Angiogram Selective Each Additional Vessel  Result Date: 02/11/2022 INDICATION: 66 year old female with history of NASH cirrhosis and portal hypertension status post TIPS creation with recurrent hemorrhagic internal hemorrhoids (grade 4). Poor surgical candidate. EXAM: 1. Ultrasound-guided vascular access of the right common femoral artery. 2. Selective catheterization angiography of the inferior mesenteric artery and bilateral superior rectal arteries. 3. Particle and coil embolization of the bilateral superior rectal arteries. MEDICATIONS: None. ANESTHESIA/SEDATION: Moderate (conscious) sedation was employed during this procedure. A total of  Versed 1.5 mg and Fentanyl 75 mcg was administered intravenously. Moderate Sedation Time: 93 minutes. The patient's level of consciousness and vital signs were monitored continuously by radiology nursing throughout the procedure under my direct supervision. CONTRAST:  46m OMNIPAQUE IOHEXOL 350 MG/ML SOLN FLUOROSCOPY: Radiation Exposure Index (as provided by the fluoroscopic device): 5144.8mGy Kerma COMPLICATIONS: None immediate. PROCEDURE: Informed consent was obtained from the patient following explanation of the procedure, risks, benefits and alternatives. The patient understands, agrees and consents for the procedure. All questions were addressed. A time out was performed prior to the initiation of the procedure. Maximal barrier sterile technique utilized including caps, mask, sterile gowns, sterile gloves, large sterile drape, hand hygiene, and Betadine prep. The right groin was prepped and draped in standard  fashion. Preprocedure ultrasound evaluation demonstrated patency of the right common femoral artery. The procedure was planned. Subdermal Local anesthesia was administered with 1% lidocaine at the planned needle entry site. A small dermatotomy was created with an 11 blade. Under direct ultrasound visualization, the right common femoral artery was accessed with a 21 gauge micropuncture needle. A permanent image was captured and stored the record. Micropuncture sheath was introduced and a limited right lower extremity angiogram was performed which demonstrated adequate puncture site for closure device use. A 0.035 inch J wire was then inserted and directed to the abdominal aorta under fluoroscopic guidance. The micropuncture sheath was exchanged for a 5 [place], 10 cm vascular sheath. A rim catheter was then introduced and attempts at selecting the inferior mesenteric artery were made which were unsuccessful. Therefore, the rim catheter was exchanged for a Mickelson catheter which successfully engaged the  ostium of the inferior mesenteric artery. Inferior mesenteric angiogram demonstrated widely patent inferior mesenteric artery with hypertrophied bilateral superior rectal arteries which are tortuous distally where there was mild blushing compatible with known internal hemorrhoids. A 2.4 [place] Progreat and 0.04 inch fathom microwire were then used to cannulate the right superior rectal artery. Superior rectal angiogram was performed which again demonstrated hypertrophy and tortuosity in the region of the right-sided internal hemorrhoids. Particle embolization was performed with 400 micron Hydrospheres until near stasis was achieved. This was followed by coil embolization with 3 mm and 2 mm 0.018" Azur microcoils. Repeat superior rectal angiogram demonstrated successful embolization. The microcatheter and wire were then retracted and used to select the left superior rectal artery. Distal left superior rectal angiogram was performed which demonstrated hypertrophy and tortuosity in the region of the left internal hemorrhoids. Particle embolization was performed with 400 micron Hydrospheres until near stasis was achieved. This was followed by coil embolization with 3 mm and 2 mm 0.018" Azur microcoils. Repeat left superior rectal angiogram demonstrated successful embolization. Repeat angiogram from the proximal superior rectal artery was then performed which demonstrated middle rectal artery branches supplying the internal hemorrhoids., Therefore, proximal bilateral superior rectal artery was coil embolized with 3 mm and 4 mm Azur 0.018" microcoils. Completion superior rectal angiogram demonstrated successful embolization of the bilateral superior rectal and middle rectal arteries without evidence of opacification of the previously visualized internal hemorrhoids. The catheters were removed. The right common femoral artery was closed with a 6 French Angio-Seal device successfully. Peripheral pulses were unchanged. The  patient tolerated the procedure well was transferred back to the floor in stable condition. IMPRESSION: Technically successful particle and coil embolization of the bilateral superior rectal arteries. Ruthann Cancer, MD [facility] Radiology Electronically Signed   By: Ruthann Cancer M.D.   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 Poor surgical candidate. EXAM: 1. Ultrasound-guided vascular access of the right common femoral artery. 2. Selective catheterization angiography of the inferior mesenteric artery and bilateral superior rectal arteries. 3. Particle and coil embolization of the bilateral superior rectal arteries. MEDICATIONS: None. ANESTHESIA/SEDATION: Moderate (conscious) sedation was employed during this procedure. A total of Versed 1.5 mg and Fentanyl 75 mcg was administered intravenously. Moderate Sedation Time: 93 minutes. The patient's level of consciousness and vital signs were monitored continuously by radiology nursing throughout the procedure under my direct supervision. CONTRAST:  7m  OMNIPAQUE IOHEXOL 350 MG/ML SOLN FLUOROSCOPY: Radiation Exposure Index (as provided by the fluoroscopic device): 875.6 mGy Kerma COMPLICATIONS: None immediate. PROCEDURE: Informed consent was obtained from the patient following explanation of the procedure, risks, benefits and alternatives. The patient understands, agrees and consents for the procedure. All questions were addressed. A time out was performed prior to the initiation of the procedure. Maximal barrier sterile technique utilized including caps, mask, sterile gowns, sterile gloves, large sterile drape, hand hygiene, and Betadine prep. The right groin was prepped and draped in  standard fashion. Preprocedure ultrasound evaluation demonstrated patency of the right common femoral artery. The procedure was planned. Subdermal Local anesthesia was administered with 1% lidocaine at the planned needle entry site. A small dermatotomy was created with an 11 blade. Under direct ultrasound visualization, the right common femoral artery was accessed with a 21 gauge micropuncture needle. A permanent image was captured and stored the record. Micropuncture sheath was introduced and a limited right lower extremity angiogram was performed which demonstrated adequate puncture site for closure device use. A 0.035 inch J wire was then inserted and directed to the abdominal aorta under fluoroscopic guidance. The micropuncture sheath was exchanged for a 5 [place], 10 cm vascular sheath. A rim catheter was then introduced and attempts at selecting the inferior mesenteric artery were made which were unsuccessful. Therefore, the rim catheter was exchanged for a Mickelson catheter which successfully engaged the ostium of the inferior mesenteric artery. Inferior mesenteric angiogram demonstrated widely patent inferior mesenteric artery with hypertrophied bilateral superior rectal arteries which are tortuous distally where there was mild blushing compatible with known internal hemorrhoids. A 2.4 [place] Progreat and 0.04 inch fathom microwire were then used to cannulate the right superior rectal artery. Superior rectal angiogram was performed which again demonstrated hypertrophy and tortuosity in the region of the right-sided internal hemorrhoids. Particle embolization was performed with 400 micron Hydrospheres until near stasis was achieved. This was followed by coil embolization with 3 mm and 2 mm 0.018" Azur microcoils. Repeat superior rectal angiogram demonstrated successful embolization. The microcatheter and wire were then retracted and used to select the left superior rectal artery. Distal left superior rectal  angiogram was performed which demonstrated hypertrophy and tortuosity in the region of the left internal hemorrhoids. Particle embolization was performed with 400 micron Hydrospheres until near stasis was achieved. This was followed by coil embolization with 3 mm and 2 mm 0.018" Azur microcoils. Repeat left superior rectal angiogram demonstrated successful embolization. Repeat angiogram from the proximal superior rectal artery was then performed which demonstrated middle rectal artery branches supplying the internal hemorrhoids., Therefore, proximal bilateral superior rectal artery was coil embolized with 3 mm and 4 mm Azur 0.018" microcoils. Completion superior rectal angiogram demonstrated successful embolization of the bilateral superior rectal and middle rectal arteries without evidence of opacification of the previously visualized internal hemorrhoids. The catheters were removed. The right common femoral artery was closed with a 6 French Angio-Seal device successfully. Peripheral pulses were unchanged. The patient tolerated the procedure well was transferred back to the floor in stable condition. IMPRESSION: Technically successful particle and coil embolization of the bilateral superior rectal arteries. Ruthann Cancer, MD [facility] Inter[facility] Radiology Specialists [facility] Radiology Electronically Signed   By: Ruthann Cancer M.D.   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 Poor surgical candidate.  EXAM: 1. Ultrasound-guided vascular access of the right common femoral artery. 2. Selective catheterization angiography of the inferior mesenteric artery and bilateral superior rectal arteries. 3. Particle and coil embolization of the bilateral superior rectal arteries. MEDICATIONS: None.  ANESTHESIA/SEDATION: Moderate (conscious) sedation was employed during this procedure. A total of Versed 1.5 mg and Fentanyl 75 mcg was administered intravenously. Moderate Sedation Time: 93 minutes. The patient's level of consciousness and vital signs were monitored continuously by radiology nursing throughout the procedure under my direct supervision. CONTRAST:  35m OMNIPAQUE IOHEXOL 350 MG/ML SOLN FLUOROSCOPY: Radiation Exposure Index (as provided by the fluoroscopic device): 5194.1mGy Kerma COMPLICATIONS: None immediate. PROCEDURE: Informed consent was obtained from the patient following explanation of the procedure, risks, benefits and alternatives. The patient understands, agrees and consents for the procedure. All questions were addressed. A time out was performed prior to the initiation of the procedure. Maximal barrier sterile technique utilized including caps, mask, sterile gowns, sterile gloves, large sterile drape, hand hygiene, and Betadine prep. The right groin was prepped and draped in standard fashion. Preprocedure ultrasound evaluation demonstrated patency of the right common femoral artery. The procedure was planned. Subdermal Local anesthesia was administered with 1% lidocaine at the planned needle entry site. A small dermatotomy was created with an 11 blade. Under direct ultrasound visualization, the right common femoral artery was accessed with a 21 gauge micropuncture needle. A permanent image was captured and stored the record. Micropuncture sheath was introduced and a limited right lower extremity angiogram was performed which demonstrated adequate puncture site for closure device use. A 0.035 inch J wire was then inserted and directed to the abdominal aorta under fluoroscopic guidance. The micropuncture sheath was exchanged for a 5 [place] 10 cm vascular sheath. A rim catheter was then introduced and attempts at selecting the inferior mesenteric artery were made which were unsuccessful.  Therefore, the rim catheter was exchanged for a Mickelson catheter which successfully engaged the ostium of the inferior mesenteric artery. Inferior mesenteric angiogram demonstrated widely patent inferior mesenteric artery with hypertrophied bilateral superior rectal arteries which are tortuous distally where there was mild blushing compatible with known internal hemorrhoids. A 2.4 [place] and 0.04 inch fathom microwire were then used to cannulate the right superior rectal artery. Superior rectal angiogram was performed which again demonstrated hypertrophy and tortuosity in the region of the right-sided internal hemorrhoids. Particle embolization was performed with 400 micron Hydrospheres until near stasis was achieved. This was followed by coil embolization with 3 mm and 2 mm 0.018" Azur microcoils. Repeat superior rectal angiogram demonstrated successful embolization. The microcatheter and wire were then retracted and used to select the left superior rectal artery. Distal left superior rectal angiogram was performed which demonstrated hypertrophy and tortuosity in the region of the left internal hemorrhoids. Particle embolization was performed with 400 micron Hydrospheres until near stasis was achieved. This was followed by coil embolization with 3 mm and 2 mm 0.018" Azur microcoils. Repeat left superior rectal angiogram demonstrated successful embolization. Repeat angiogram from the proximal superior rectal artery was then performed which demonstrated middle rectal artery branches supplying the internal hemorrhoids., Therefore, proximal bilateral superior rectal artery was coil embolized with 3 mm and 4 mm Azur 0.018" microcoils. Completion superior rectal angiogram demonstrated successful embolization of the bilateral superior rectal and middle rectal arteries without evidence of opacification of the previously visualized internal hemorrhoids. The catheters were removed. The right common femoral artery  was closed with a 6 French Angio-Seal device successfully. Peripheral pulses were  unchanged. The patient tolerated the procedure well was transferred back to the floor in stable condition. IMPRESSION: Technically successful particle and coil embolization of the bilateral superior rectal arteries. Ruthann Cancer, MD [facility] Inter[facility] Radiology Specialists [facility] Radiology Electronically Signed   By: Ruthann Cancer M.D.   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 Poor surgical candidate. EXAM: 1. Ultrasound-guided vascular access of the right common femoral artery. 2. Selective catheterization angiography of the inferior mesenteric artery and bilateral superior rectal arteries. 3. Particle and coil embolization of the bilateral superior rectal arteries. MEDICATIONS: None. ANESTHESIA/SEDATION: Moderate (conscious) sedation was employed during this procedure. A total of Versed 1.5 mg and Fentanyl 75 mcg was administered intravenously. Moderate Sedation Time: 93 minutes. The patient's level of consciousness and vital signs were monitored continuously by radiology nursing throughout the procedure under my direct supervision. CONTRAST:  50m OMNIPAQUE IOHEXOL 350 MG/ML SOLN FLUOROSCOPY: Radiation Exposure Index (as provided by the fluoroscopic device): 5409.8mGy Kerma COMPLICATIONS: None immediate. PROCEDURE: Informed consent was obtained from the patient following explanation of the procedure, risks, benefits and alternatives. The patient understands, agrees and consents for the procedure. All questions were addressed. A time out was performed prior to the initiation of the procedure. Maximal barrier sterile technique utilized including caps, mask, sterile gowns, sterile gloves, large sterile drape,  hand hygiene, and Betadine prep. The right groin was prepped and draped in standard fashion. Preprocedure ultrasound evaluation demonstrated patency of the right common femoral artery. The procedure was planned. Subdermal Local anesthesia was administered with 1% lidocaine at the planned needle entry site. A small dermatotomy was created with an 11 blade. Under direct ultrasound visualization, the right common femoral artery was accessed with a 21 gauge micropuncture needle. A permanent image was captured and stored the record. Micropuncture sheath was introduced and a limited right lower extremity angiogram was performed which demonstrated adequate puncture site for closure device use. A 0.035 inch J wire was then inserted and directed to the abdominal aorta under fluoroscopic guidance. The micropuncture sheath was exchanged for a 5 [place] 10 cm vascular sheath. A rim catheter was then introduced and attempts at selecting the inferior mesenteric artery were made which were unsuccessful. Therefore, the rim catheter was exchanged for a Mickelson catheter which successfully engaged the ostium of the inferior mesenteric artery. Inferior mesenteric angiogram demonstrated widely patent inferior mesenteric artery with hypertrophied bilateral superior rectal arteries which are tortuous distally where there was mild blushing compatible with known internal hemorrhoids. A 2.4 [place] and 0.04 inch fathom microwire were then used to cannulate the right superior rectal artery. Superior rectal angiogram was performed which again demonstrated hypertrophy and tortuosity in the region of the right-sided internal hemorrhoids. Particle embolization was performed with 400 micron Hydrospheres until near stasis was achieved. This was followed by coil embolization with 3 mm and 2 mm 0.018" Azur microcoils. Repeat superior rectal angiogram demonstrated successful embolization. The microcatheter and wire were then retracted and  used to select the left superior rectal artery. Distal left superior rectal angiogram was performed which demonstrated hypertrophy and tortuosity in the region of the left internal hemorrhoids. Particle embolization was performed with 400 micron Hydrospheres until near stasis was achieved. This was followed by coil embolization with 3 mm and 2 mm 0.018" Azur microcoils. Repeat left superior rectal angiogram demonstrated successful  embolization. Repeat angiogram from the proximal superior rectal artery was then performed which demonstrated middle rectal artery branches supplying the internal hemorrhoids., Therefore, proximal bilateral superior rectal artery was coil embolized with 3 mm and 4 mm Azur 0.018" microcoils. Completion superior rectal angiogram demonstrated successful embolization of the bilateral superior rectal and middle rectal arteries without evidence of opacification of the previously visualized internal hemorrhoids. The catheters were removed. The right common femoral artery was closed with a 6 French Angio-Seal device successfully. Peripheral pulses were unchanged. The patient tolerated the procedure well was transferred back to the floor in stable condition. IMPRESSION: Technically successful particle and coil embolization of the bilateral superior rectal arteries. Ruthann Cancer, MD V[facility] and Interventional Radiology Specialists [facility] Radiology Electronically Signed   By: Ruthann Cancer M.D.   On: 02/10/2022 09:18   DG Chest 2 View  Result Date: 02/08/2022 CLINICAL DATA:  Nonproductive cough. EXAM: CHEST - 2 VIEW COMPARISON:  01/27/2022 FINDINGS: The heart size and mediastinal contours are within normal limits. Both lungs are clear. Right shoulder prosthesis again noted. IMPRESSION: No active cardiopulmonary disease. Electronically Signed   By: Marlaine Hind M.D.   On: 02/08/2022 11:26   US LIVER DOPPLER  Result Date: 02/06/2022 CLINICAL DATA:  66 year old female with prior tips EXAM:  DUPLEX ULTRASOUND OF LIVER TECHNIQUE: Color and duplex Doppler ultrasound was performed to evaluate the hepatic in-flow and out-flow vessels. COMPARISON:  None FINDINGS: Portal Vein Velocities Main:  32 cm/sec Right:  60 cm/sec Left:  18 cm/sec Stent: Proximal: 60 centimeter/second Mid: 119 centimeter/second Distal: 151 centimeter/second Hepatic Vein Velocities Right:  89 cm/sec Middle:  151 cm/sec Left:  66 cm/sec Hepatic Artery Velocity:  136 cm/sec Splenic Vein Velocity:  32 cm/sec Varices: Absent Ascites: Present Spleen diameter estimated diameter 17.5 cm IMPRESSION: Tip stent patent. Velocity measured in the proximal stent is below expected normal threshold, which can be seen with narrowing. Ascites Signed, Dulcy Fanny. Nadene Rubins, RPVI Vascular and Interventional Radiology Specialists [facility] Radiology Electronically Signed   By: Corrie Mckusick D.O.   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 Patient was at outside facility about a week ago where she required three units of pRBC's for hematochezia attributed to hemorrhoids. Last colonoscopy about a year ago with not great prep but did have grade III-IV hemorrhoids on retroflexion. She notes that when her abdomen becomes more distended with ascites, her hemorrhoids protrude more. Baseline hemoglobin is 7-8. Last bowel movement was early this morning with small amount of clot. EXAM: CTA ABDOMEN AND PELVIS WITHOUT AND WITH CONTRAST TECHNIQUE: Multidetector CT imaging of the abdomen and pelvis was performed using the standard protocol during bolus administration of intravenous contrast. Multiplanar reconstructed images and MIPs were obtained and reviewed to  evaluate the vascular anatomy. RADIATION DOSE REDUCTION: This exam was performed according to the departmental dose-optimization program which includes automated exposure control, adjustment of the mA and/or kV according to patient size and/or use of iterative reconstruction technique. CONTRAST:  143m OMNIPAQUE IOHEXOL 350 MG/ML SOLN COMPARISON:  CT abdomen pelvis 10/25/2020 FINDINGS: VASCULAR Aorta: Minimal scattered atheromatous plaque without aneurysmal dilatation or significant stenosis. Celiac: No significant stenosis. SMA: No significant stenosis. Renals: Evaluation limited due to suboptimal  arterial phase imaging. There is at least mild to moderate stenosis at the origin of the right and mild stenosis of the origin of the left main renal arteries. IMA: Severe stenosis at the origin, otherwise patent. Inflow: Atherosclerotic calcifications seen throughout the right common iliac artery. Scattered atheromatous plaques present in the proximal left common iliac artery. No significant inflow stenosis is seen. Proximal Outflow: No significant stenosis of the visualized outflow arteries. Veins: Middle hepatic to right portal vein TIPS is patent. No significant opacification of hepatic veins. Main, right, and left portal veins are patent. Superior mesenteric vein is mildly dilated but patent. The splenic vein is patent and dilated. Small gastroesophageal varix is noted with minimally dilated left coronary vein. No evidence of significant portal gastropathy. The very small splenorenal shunt is seen. Recanalized paraumbilical vein is noted. Review of the MIP images confirms the above findings. NON-VASCULAR Lower chest: No acute abnormality. Hepatobiliary: Mildly shrunken nodular liver consistent with cirrhosis. No focal hepatic lesion is seen. Sludge and stone noted within the gallbladder. No dilated bile ducts. Pancreas: Unremarkable. No pancreatic ductal dilatation or surrounding inflammatory changes. Spleen: Spleen  is enlarged measuring up to 17.3 cm in maximum dimension which is not significantly changed since prior CT. Adrenals/Urinary Tract: Adrenal glands are normal. No significant abnormality of the kidneys or ureters. Diffuse bladder wall thickening likely due to under distension. Chronic cystitis and outlet obstruction can have a similar appearance. Stomach/Bowel: No bowel dilatation to indicate ileus or obstruction. Appendix is not identified. Lymphatic: Mildly prominent bilateral inguinal lymph nodes are most likely inflammatory. No enlarged abdominal lymph nodes are seen. Reproductive: Status post hysterectomy. No adnexal masses. Other: Small umbilical hernia containing ascites. Mild-to-moderate abdominal ascites predominantly located adjacent to the liver. Musculoskeletal: No acute or significant osseous findings. IMPRESSION: VASCULAR 1. Patent TIPS. 2. Patent portal vein. 3. Evidence of portal hypertension in the form of splenomegaly, small splenorenal, recanalized periumbilical vein, and small gastroesophageal varices from minimally dilated left coronary vein. NON-VASCULAR 1. Mild to moderate abdominal ascites, predominantly adjacent to the liver. 2. Hepatic cirrhosis. 3. Cholelithiasis. Electronically Signed   By: Miachel Roux M.D.   On: 02/05/2022 16:14    Labs:  CBC: Recent Labs    02/10/22 0517 02/21/22 1408 03/05/22 1639 03/06/22 0104 03/06/22 0533  WBC 4.6 5.3 4.2  --  4.6  HGB 8.9* 7.1* 5.1* 6.6* 7.6*  HCT 28.2* 23.1* 17.0*  --  23.8*  PLT 132* 163 127*  --  108*    COAGS: Recent Labs    01/27/22 1808 01/28/22 1707 02/04/22 1245 03/05/22 1650  INR 1.2 1.2 1.3* 1.2  APTT  --   --  31  --     BMP: Recent Labs    02/08/22 0712 02/10/22 0517 03/05/22 1639 03/06/22 0533  NA 135 135 137 138  K 4.0 3.0* 3.4* 3.7  CL 112* 109 111 113*  CO2 18* 21* 22 21*  GLUCOSE 152* 125* 247* 155*  BUN 36* 36* 36* 34*  CALCIUM 7.8* 8.0* 8.2* 7.9*  CREATININE 1.41* 1.37* 1.58* 1.66*   GFRNONAA 42* 43* 36* 34*    LIVER FUNCTION TESTS: Recent Labs    02/04/22 1245 02/07/22 0549 02/10/22 0517 03/05/22 1639  BILITOT 1.5* 1.6* 1.4* 1.5*  AST 36 36 47* 42*  ALT 22 23 24 24   ALKPHOS 85 83 99 111  PROT 4.3* 4.3* 4.9* 4.7*  ALBUMIN 2.5* 2.2* 2.4* 2.5*    TUMOR MARKERS: No results for input(s): "AFPTM", "CEA", "CA199", "  Ronceverte" in the last 8760 hours.  Assessment and Plan: Pt with past medical history of NASH cirrhosis with portal hypertension, status post TIPS May 2023 at [facility], HTN, DM type II, depression, thalassemia minor, OSA on CPAP and history of bleeding hemorrhoids requiring transfusion. Pt is well-known to IR service having underwent particle and coil embolization of rectal artery 02/07/2022 with Dr. Serafina Royals. Patient was sent to ED 03/05/22 for evaluation of hemoglobin of 5.5 checked on a routine follow-up visit. Patient referred to IR for evaluation of rectal bleeding by Dr. Leslye Peer.   Pt reports since TIPS, frequency of paracentesis remains at once per week with decrease in volume removed.  Pt endorses less and intermittent rectal bleeding with some mild oozing after bowel movements since rectal artery embolization.  She adds that her baseline Hgb Is 7-8 due to thalassemia minor.   Plan:  -Pt was consulted at bedside with Dr. Anselm Pancoast who recommends conservative surveillance at this time.   Other possible interventions to consider: - Evaluation of TIPS stent pressures.  - Consult from GI to evaluate patient to re=scope to see if there has been a change with hemorrhoids.   Please call IR with questions/concerns.   Thank you for this interesting consult.  I greatly enjoyed meeting KARMELLA BOUVIER and look forward to participating in their care.  A copy of this report was sent to the requesting provider on this date.  Electronically Signed: Tyson Alias, NP 03/06/2022, 4:58 PM   I spent a total of 25 minutes in face to face in clinical consultation, greater  than 50% of which was counseling/coordinating care for rectal bleeding.

## 2022-03-06 NOTE — TOC Initial Note (Signed)
Transition of Care [facility]) - Initial/Assessment Note    Patient Details  Name: Rachel Huber MRN: [id number] Date of Birth: 1955/11/10  Transition of Care [facility]) CM/SW Contact:    Laurena Slimmer, RN Phone Number: 03/06/2022, 4:04 PM  Clinical Narrative:                  Transition of Care [facility]) Screening Note   Patient Details  Name: Rachel Huber Date of Birth: 09/04/1955   Transition of Care [facility]) CM/SW Contact:    Laurena Slimmer, RN Phone Number: 03/06/2022, 4:04 PM    Transition of Care Department [facility]) has reviewed patient and no TOC needs have been identified at this time. We will continue to monitor patient advancement through interdisciplinary progression rounds. If new patient transition needs arise, please place a TOC consult.          Patient Goals and CMS Choice        Expected Discharge Plan and Services                                                Prior Living Arrangements/Services                       Activities of Daily Living Home Assistive Devices/Equipment: Cane (specify quad or straight), Eyeglasses ADL Screening (condition at time of admission) Patient's cognitive ability adequate to safely complete daily activities?: Yes Is the patient deaf or have difficulty hearing?: No Does the patient have difficulty seeing, even when wearing glasses/contacts?: No Does the patient have difficulty concentrating, remembering, or making decisions?: No Patient able to express need for assistance with ADLs?: Yes Does the patient have difficulty dressing or bathing?: No Independently performs ADLs?: No Communication: Independent Dressing (OT): Independent Grooming: Independent Feeding: Independent Bathing: Needs assistance Is this a change from baseline?: Pre-admission baseline Toileting: Independent with device (comment) In/Out Bed: Independent with device (comment) Walks in Home: Independent with device (comment) Does the  patient have difficulty walking or climbing stairs?: Yes Weakness of Legs: Both Weakness of Arms/Hands: None  Permission Sought/Granted                  Emotional Assessment              Admission diagnosis:  Acute blood loss anemia [D62] Rectal bleeding [K62.5] Anemia, unspecified type [D64.9] ABLA (acute blood loss anemia) [D62] Patient Active Problem List   Diagnosis Date Noted   Acute on chronic blood loss anemia 03/05/2022   Hematochezia 03/05/2022   Thrombocytopenia ([place]) 03/05/2022   Fever 02/08/2022   Pancytopenia ([place]) 02/07/2022   GI bleeding 02/04/2022   Chronic kidney disease, stage 3b ([place]) 02/04/2022   Uncontrolled type 2 diabetes mellitus with hyperglycemia, without long-term current use of insulin ([place]) 02/04/2022   Depression with anxiety 02/04/2022   Liver cirrhosis secondary to NASH ([place])    Acute GI hemorrhage 01/28/2022   AKI (acute kidney injury) ([place]) 01/27/2022   Insomnia 01/27/2022   Iron deficiency anemia due to chronic blood loss 02/13/2021   History of uterine cancer 02/13/2021   Decompensated hepatic cirrhosis ([place]) 10/25/2020   OSA on CPAP 10/25/2020   Glossitis 10/25/2020   Hx of bleeding grade IV hemorrhoids s/p coil embolization by IR 02/07/22    Acute blood loss anemia 10/08/2020   Hyponatremia 10/08/2020  Rectal bleeding 10/08/2020   Acute respiratory failure with hypoxia ([place]) 09/10/2020   Pneumonia due to COVID-19 virus 09/08/2020   Hip fracture, right ([place]) 09/04/2020   Closed right hip fracture ([place]) 09/02/2020   Diabetes mellitus, controlled ([place]) 09/02/2020   Thalassemia minor    HLD (hyperlipidemia)    Acute kidney injury superimposed on CKD ([place])    Depression    Fall at home, initial encounter    Nondisplaced fracture of greater trochanter of right femur, initial encounter for closed fracture [facility])    PCP:  [name], MD Pharmacy:   [facility] [address] [place] [address] [place] [address] Phone: [phone] Fax: [phone]     Social Determinants of Health (SDOH) Interventions    Readmission Risk Interventions     No data to display

## 2022-03-06 NOTE — Progress Notes (Signed)
Admission profile updated. ?

## 2022-03-06 NOTE — Assessment & Plan Note (Signed)
Last hemoglobin A1c low at 5.7.  On low-dose Lipitor.

## 2022-03-07 ENCOUNTER — Inpatient Hospital Stay (HOSPITAL_COMMUNITY): Payer: 59

## 2022-03-07 DIAGNOSIS — K7581 Nonalcoholic steatohepatitis (NASH): Secondary | ICD-10-CM | POA: Diagnosis not present

## 2022-03-07 DIAGNOSIS — R69 Illness, unspecified: Secondary | ICD-10-CM | POA: Diagnosis not present

## 2022-03-07 DIAGNOSIS — K7469 Other cirrhosis of liver: Secondary | ICD-10-CM | POA: Diagnosis not present

## 2022-03-07 DIAGNOSIS — D563 Thalassemia minor: Secondary | ICD-10-CM | POA: Diagnosis not present

## 2022-03-07 DIAGNOSIS — K7460 Unspecified cirrhosis of liver: Secondary | ICD-10-CM | POA: Diagnosis not present

## 2022-03-07 DIAGNOSIS — D62 Acute posthemorrhagic anemia: Secondary | ICD-10-CM | POA: Diagnosis not present

## 2022-03-07 DIAGNOSIS — E785 Hyperlipidemia, unspecified: Secondary | ICD-10-CM | POA: Diagnosis not present

## 2022-03-07 DIAGNOSIS — G4733 Obstructive sleep apnea (adult) (pediatric): Secondary | ICD-10-CM | POA: Diagnosis not present

## 2022-03-07 DIAGNOSIS — D696 Thrombocytopenia, unspecified: Secondary | ICD-10-CM | POA: Diagnosis not present

## 2022-03-07 DIAGNOSIS — Z9989 Dependence on other enabling machines and devices: Secondary | ICD-10-CM | POA: Diagnosis not present

## 2022-03-07 DIAGNOSIS — E1169 Type 2 diabetes mellitus with other specified complication: Secondary | ICD-10-CM | POA: Diagnosis not present

## 2022-03-07 DIAGNOSIS — K921 Melena: Secondary | ICD-10-CM | POA: Diagnosis not present

## 2022-03-07 DIAGNOSIS — N1832 Chronic kidney disease, stage 3b: Secondary | ICD-10-CM | POA: Diagnosis not present

## 2022-03-07 DIAGNOSIS — D649 Anemia, unspecified: Secondary | ICD-10-CM | POA: Diagnosis not present

## 2022-03-07 LAB — CBC
HCT: 24.2 % — ABNORMAL LOW (ref 36.0–46.0)
Hemoglobin: 7.7 g/dL — ABNORMAL LOW (ref 12.0–15.0)
MCH: 25.1 pg — ABNORMAL LOW (ref 26.0–34.0)
MCHC: 31.8 g/dL (ref 30.0–36.0)
MCV: 78.8 fL — ABNORMAL LOW (ref 80.0–100.0)
Platelets: 117 10*3/uL — ABNORMAL LOW (ref 150–400)
RBC: 3.07 MIL/uL — ABNORMAL LOW (ref 3.87–5.11)
RDW: 21.6 % — ABNORMAL HIGH (ref 11.5–15.5)
WBC: 4.5 10*3/uL (ref 4.0–10.5)
nRBC: 0.4 % — ABNORMAL HIGH (ref 0.0–0.2)

## 2022-03-07 LAB — BASIC METABOLIC PANEL
Anion gap: 5 (ref 5–15)
BUN: 33 mg/dL — ABNORMAL HIGH (ref 8–23)
CO2: 22 mmol/L (ref 22–32)
Calcium: 8.2 mg/dL — ABNORMAL LOW (ref 8.9–10.3)
Chloride: 114 mmol/L — ABNORMAL HIGH (ref 98–111)
Creatinine, Ser: 1.54 mg/dL — ABNORMAL HIGH (ref 0.44–1.00)
GFR, Estimated: 37 mL/min — ABNORMAL LOW (ref >60)
Glucose, Bld: 205 mg/dL — ABNORMAL HIGH (ref 70–99)
Potassium: 3.4 mmol/L — ABNORMAL LOW (ref 3.5–5.1)
Sodium: 141 mmol/L (ref 135–145)

## 2022-03-07 LAB — GLUCOSE, CAPILLARY
Glucose-Capillary: 147 mg/dL — ABNORMAL HIGH (ref 70–99)
Glucose-Capillary: 206 mg/dL — ABNORMAL HIGH (ref 70–99)

## 2022-03-07 LAB — PREPARE RBC (CROSSMATCH)

## 2022-03-07 MED ORDER — WITCH HAZEL-GLYCERIN EX PADS: MEDICATED_PAD | CUTANEOUS | 0 refills | Status: DC | PRN

## 2022-03-07 MED ORDER — SODIUM CHLORIDE 0.9% IV SOLUTION: Freq: Once | INTRAVENOUS | Status: AC

## 2022-03-07 MED ORDER — HYDROCORTISONE (PERIANAL) 2.5 % EX CREA: TOPICAL_CREAM | Freq: Four times a day (QID) | CUTANEOUS | 0 refills | Status: DC

## 2022-03-07 MED ORDER — POTASSIUM CHLORIDE CRYS ER 20 MEQ PO TBCR
40.0000 meq | EXTENDED_RELEASE_TABLET | Freq: Once | ORAL | Status: AC
Start: 2022-03-07 — End: 2022-03-07
  Administered 2022-03-07: 40 meq via ORAL
  Filled 2022-03-07: qty 2

## 2022-03-07 MED ORDER — FUROSEMIDE 10 MG/ML IJ SOLN
40.0000 mg | Freq: Once | INTRAMUSCULAR | Status: AC
  Administered 2022-03-07: 40 mg via INTRAVENOUS
  Filled 2022-03-07: qty 4

## 2022-03-07 NOTE — Progress Notes (Signed)
GI Inpatient Follow-up Note  Subjective:  Patient seen in follow-up for anemia and hematochezia. No acute events overnight. Hemoglobin stable at 7.7 today. She has received additional unit pRBCs today. She denies any complaints of nausea, vomiting, or abdominal pain. IR saw patient yesterday afternoon and no interventions planned. She is wanting to go home. She reports she received notice TIPS revision procedure planned for next week at [facility]. No new complaints offered today.   Scheduled Inpatient Medications:   atorvastatin  10 mg Oral Daily   gabapentin  800 mg Oral Q0200   hydrocortisone   Rectal QID   insulin aspart  0-20 Units Subcutaneous Q4H   lactulose  20 g Oral Daily   sertraline  50 mg Oral Daily   vitamin B-12  1,000 mcg Oral Daily     PRN Inpatient Medications:  acetaminophen **OR** acetaminophen, albuterol, diphenhydrAMINE, lip balm, melatonin, morphine injection, ondansetron **OR** ondansetron (ZOFRAN) IV, sodium chloride, witch hazel-glycerin  Review of Systems: Constitutional: Weight is stable.  Eyes: No changes in vision. ENT: No oral lesions, sore throat.  GI: see HPI.  Heme/Lymph: No easy bruising.  CV: No chest pain.  GU: No hematuria.  Integumentary: No rashes.  Neuro: No headaches.  Psych: No depression/anxiety.  Endocrine: No heat/cold intolerance.  Allergic/Immunologic: No urticaria.  Resp: No cough, SOB.  Musculoskeletal: No joint swelling.    Physical Examination: BP (!) 111/47   Pulse 87   Temp 98.6 F (37 C) (Oral)   Resp 19   Ht 5' 9"  (1.753 m)   Wt 121.8 kg   SpO2 99%   BMI 39.65 kg/m  Gen: NAD, alert and oriented x 4 HEENT: PEERLA, EOMI, Neck: supple, no JVD or thyromegaly Chest: CTA bilaterally, no wheezes, crackles, or other adventitious sounds CV: RRR, no m/g/c/r Abd: soft, mild distention, +BS in all four quadrants; no TTP in all four quadrants, no HSM, guarding, ridigity, or rebound tenderness Ext: no edema, well perfused  with 2+ pulses, Skin: no rash or lesions noted Lymph: no LAD  Data: Lab Results  Component Value Date   WBC 4.5 03/07/2022   HGB 7.7 (L) 03/07/2022   HCT 24.2 (L) 03/07/2022   MCV 78.8 (L) 03/07/2022   PLT 117 (L) 03/07/2022   Recent Labs  Lab 03/06/22 0104 03/06/22 0533 03/07/22 0447  HGB 6.6* 7.6* 7.7*   Lab Results  Component Value Date   NA 141 03/07/2022   K 3.4 (L) 03/07/2022   CL 114 (H) 03/07/2022   CO2 22 03/07/2022   BUN 33 (H) 03/07/2022   CREATININE 1.54 (H) 03/07/2022   Lab Results  Component Value Date   ALT 24 03/05/2022   AST 42 (H) 03/05/2022   ALKPHOS 111 03/05/2022   BILITOT 1.5 (H) 03/05/2022   Recent Labs  Lab 03/05/22 1650  INR 1.2   Assessment/Plan:  66 y/o Caucasian female with a PMH of 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 GI following.   Hematochezia - chronic, persistent. Presumably from hemorrhoids. Hemoglobin stable at 7.7. s/p 1 additional unit pRBCs today.   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   Octavia Bruckner, PA-C [facility] [phone]

## 2022-03-07 NOTE — Discharge Summary (Signed)
Physician Discharge Summary   Patient: Rachel Huber MRN: [id number] DOB: [birth year]  Admit date:     03/05/2022  Discharge date: 03/07/22  Discharge Physician: Loletha Grayer   PCP: [name], MD   Recommendations at discharge:   PCP 5 days Follow-up at [facility] for TIPS revision Follow-up hematology on a weekly basis to follow-up blood counts  Discharge Diagnoses: Principal Problem:   Acute on chronic blood loss anemia Active Problems:   Liver cirrhosis secondary to NASH ([place])   Thrombocytopenia (HCC)   Chronic kidney disease, stage 3b ([place])   Thalassemia minor   Depression with anxiety   Type 2 diabetes mellitus with hyperlipidemia ([place])   OSA on CPAP    Hospital Course: Patient was admitted to the hospital on 03/05/2022 and discharged on 03/08/2019.  Patient was sent in for anemia.  Patient was given 2 units of packed red blood cells in the emergency room on a hemoglobin of 5.1.  Hemoglobin did come up to 7.6.  I gave IV Venofer on 03/06/2022.  Hemoglobin on 03/07/2022 was 7.7.  Since the patient does have intermittent bleeding of her hemorrhoids I will give another unit of packed red blood cells prior to discharge.  Seen on this hospital stay by interventional radiology and gastroenterology and they did not recommend any procedures.  Had an embolization procedure for hemorrhoids on last hospitalization.  The patient will follow-up at [facility] for a TIPS revision.  A liver transplant will help out with her bleeding hemorrhoids.  Assessment and Plan: * Acute on chronic blood loss anemia Hematochezia, with ongoing hemorrhoidal bleeding History of bleeding grade 4 hemorrhoids s/p bilateral superior rectal artery particle and coil embolization on 02/07/2022 History of large-volume transfusion for hemorrhoidal bleed June 2023, 4.5 units PRBC  Discharge hemoglobin was 8.9 but baseline around 7.  Hemoglobin today is 5.1. Status post 2 units of packed red blood cells and hemoglobin  came up to 7.6.  We will give 400 mg of IV Venofer today.    Hemoglobin 7.7.  Transfuse 1 unit of packed red blood cells before discharge today   We will give Anusol cream and witch hazel.  Recommend following up with hematology on a weekly basis.  They should be able to give iron infusions and even blood transfusions in the infusion center to avoid hospitalization.  Liver cirrhosis secondary to NASH ([place]) Appears mostly compensated. On transplant list and saw her doctors at [place] earlier in the day on 7/19 S/p TIPS 12-26-2021 Weekly paracentesis Continue lactulose daily.  Not currently on Lasix, spironolactone because of kidney function  Thrombocytopenia ([place]) Likely secondary to cirrhosis.  Last platelet count 117.  Chronic kidney disease, stage 3b (HCC) Creatinine 1.54 today with a GFR of 37  Thalassemia minor Followed by hematology.  Continue to monitor hemoglobin as outpatient on a weekly basis.  Depression with anxiety Continue sertraline  Type 2 diabetes mellitus with hyperlipidemia (HCC) Last hemoglobin A1c low at 5.7.  On low-dose Lipitor.         Consultants: Gastroenterology and interventional radiology Procedures performed: None Disposition: Home Diet recommendation:  Regular diet DISCHARGE MEDICATION: Allergies as of 03/07/2022       Reactions   Gramineae Pollens Other (See Comments)   Sneezing, Running nose   Latex Rash   Contact rash        Medication List     STOP taking these medications    guaiFENesin-dextromethorphan 100-10 MG/5ML syrup Commonly known as: ROBITUSSIN DM   pantoprazole  40 MG tablet Commonly known as: Protonix       TAKE these medications    acetaminophen 650 MG CR tablet Commonly known as: TYLENOL Take 1,300 mg by mouth daily as needed for pain.   albuterol 108 (90 Base) MCG/ACT inhaler Commonly known as: VENTOLIN HFA Inhale 2 puffs into the lungs every 6 (six) hours as needed for wheezing or shortness of  breath.   atorvastatin 10 MG tablet Commonly known as: LIPITOR Take 10 mg by mouth daily.   Cholecalciferol 50 MCG (2000 UT) Caps Take 2,000 Units by mouth daily.   diphenhydrAMINE 25 MG tablet Commonly known as: BENADRYL Take 25 mg by mouth 2 (two) times daily as needed for itching.   gabapentin 400 MG capsule Commonly known as: NEURONTIN Take 800 mg by mouth 2 (two) times daily.   hydrocortisone 2.5 % rectal cream Commonly known as: ANUSOL-HC Place rectally 4 (four) times daily.   hydrocortisone 25 MG suppository Commonly known as: ANUSOL-HC Place 1 suppository (25 mg total) rectally 2 (two) times daily.   lactulose (encephalopathy) 10 GM/15ML Soln Commonly known as: CHRONULAC Take 30 mLs (20 g total) by mouth daily.   melatonin 5 MG Tabs Take 10 mg by mouth at bedtime as needed (sleep).   multivitamin with minerals tablet Take 1 tablet by mouth daily.   ondansetron 4 MG disintegrating tablet Commonly known as: ZOFRAN-ODT Take 1 tablet (4 mg total) by mouth every 8 (eight) hours as needed for nausea or vomiting.   sertraline 50 MG tablet Commonly known as: ZOLOFT Take 50 mg by mouth daily.   sodium chloride 0.65 % Soln nasal spray Commonly known as: OCEAN Place 1 spray into both nostrils as needed for congestion.   vitamin B-12 1000 MCG tablet Commonly known as: CYANOCOBALAMIN Take 1,000 mcg by mouth daily.   witch hazel-glycerin pad Commonly known as: TUCKS Apply topically as needed for hemorrhoids.        Follow-up Information     [name], MD Follow up in 5 day(s).   Specialty: Internal Medicine Contact information: [place] [address] [phone]         Harriett Rush, PA-C Follow up in 6 day(s).   Specialty: Oncology Why: needs weekly visits to for anemia Contact information: [address] [place] [address] [phone]                Discharge  Exam: Filed Weights   03/05/22 1545  Weight: 121.8 kg   Physical Exam HENT:     Head: Normocephalic.     Mouth/Throat:     Pharynx: No oropharyngeal exudate.  Eyes:     General: Lids are normal.     Conjunctiva/sclera: Conjunctivae normal.  Cardiovascular:     Rate and Rhythm: Normal rate and regular rhythm.     Heart sounds: Normal heart sounds, S1 normal and S2 normal.  Pulmonary:     Breath sounds: No decreased breath sounds, wheezing, rhonchi or rales.  Abdominal:     Palpations: Abdomen is soft.     Tenderness: There is no abdominal tenderness.  Musculoskeletal:     Right lower leg: Swelling present.     Left lower leg: Swelling present.  Skin:    General: Skin is warm.  Neurological:     Mental Status: She is alert and oriented to person, place, and time.      Condition at discharge: stable  The results  of significant diagnostics from this hospitalization (including imaging, microbiology, ancillary and laboratory) are listed below for reference.   Imaging Studies: US Paracentesis  Result Date: 03/04/2022 INDICATION: Patient with history of NASH cirrhosis and recurrent ascites status post tips procedure at [facility], maximum volume request of 8 L to be drawn off per ordering provider. EXAM: ULTRASOUND GUIDED  PARACENTESIS MEDICATIONS: 1% lidocaine only. COMPLICATIONS: None immediate. PROCEDURE: Informed written consent was obtained from the patient after a discussion of the risks, benefits and alternatives to treatment. A timeout was performed prior to the initiation of the procedure. Initial ultrasound scanning demonstrates a large amount of ascites within the right upper abdominal quadrant. The right upper abdomen was prepped and draped in the usual sterile fashion. 1% lidocaine was used for local anesthesia. Following this, a 19 gauge, 10-cm, Yueh catheter was introduced. An ultrasound image was saved for documentation purposes. The paracentesis was performed. The catheter  was removed and a dressing was applied. The patient tolerated the procedure well without immediate post procedural complication. Patient received post-procedure intravenous albumin; see nursing notes for details. FINDINGS: A total of approximately 7 Liters of clear yellow fluid was removed. IMPRESSION: Successful ultrasound-guided paracentesis yielding 7 liters of peritoneal fluid. PLAN: The patient has reportedly had a TIPS procedure at [facility] on 12/26/2021 and is awaiting liver transplant there, she is followed by the liver clinic at [facility]. This exam was performed by Tsosie Billing PA-C, and was supervised and interpreted by Dr. Annamaria Boots Electronically Signed   By: Jerilynn Mages.  Shick M.D.   On: 03/04/2022 12:35   US Paracentesis  Result Date: 02/25/2022 INDICATION: Patient with history of NASH cirrhosis and recurrent ascites status post tips procedure at [facility], maximum volume request of 8 L to be drawn off per ordering provider. EXAM: ULTRASOUND GUIDED  PARACENTESIS MEDICATIONS: Local 1% lidocaine only. COMPLICATIONS: None immediate. PROCEDURE: Informed written consent was obtained from the patient after a discussion of the risks, benefits and alternatives to treatment. A timeout was performed prior to the initiation of the procedure. Initial ultrasound scanning demonstrates a large amount of ascites within the right lower abdominal quadrant. The right lower abdomen was prepped and draped in the usual sterile fashion. 1% lidocaine was used for local anesthesia. Following this, a 19 gauge, 10-cm, Yueh catheter was introduced. An ultrasound image was saved for documentation purposes. The paracentesis was performed. The catheter was removed and a dressing was applied. The patient tolerated the procedure well without immediate post procedural complication. Patient received post-procedure intravenous albumin; see nursing notes for details. FINDINGS: A total of approximately 6.4 L of clear yellow fluid was  removed. IMPRESSION: Successful ultrasound-guided paracentesis yielding 6.4 liters of peritoneal fluid. PLAN: The patient has reportedly had a TIPS procedure at [facility] on 12/26/2021 and is awaiting liver transplant there, she is followed by the liver clinic at [facility]. This exam was performed by Tsosie Billing PA-C, and was supervised and interpreted by Dr. Kathlene Cote. Electronically Signed   By: Aletta Edouard M.D.   On: 02/25/2022 12:36   US Paracentesis  Result Date: 02/19/2022 INDICATION: Patient with history of NASH cirrhosis and recurrent ascites status post tips procedure at [facility], maximum volume request of 8 L to be drawn off per ordering provider. EXAM: ULTRASOUND GUIDED  PARACENTESIS MEDICATIONS: Local 1% lidocaine only. COMPLICATIONS: None immediate. PROCEDURE: Informed written consent was obtained from the patient after a discussion of the risks, benefits and alternatives to treatment. A timeout was performed prior to the initiation  of the procedure. Initial ultrasound scanning demonstrates a large amount of ascites within the right upper abdominal quadrant. The right upper abdomen was prepped and draped in the usual sterile fashion. 1% lidocaine was used for local anesthesia. Following this, a 19 gauge, 10-cm, Yueh catheter was introduced. An ultrasound image was saved for documentation purposes. The paracentesis was performed. The catheter was removed and a dressing was applied. The patient tolerated the procedure well without immediate post procedural complication. FINDINGS: A total of approximately 7.5 L of clear yellow fluid was removed. IMPRESSION: Successful ultrasound-guided paracentesis yielding 7.5 L liters of peritoneal fluid. PLAN: The patient has reportedly had a TIPS procedure at [facility] on 12/26/2021 and is awaiting liver transplant there, she is followed by the liver clinic at [facility]. This exam was performed by Tsosie Billing PA-C, and was supervised and  interpreted by Dr. Denna Haggard. Electronically Signed   By: Albin Felling M.D.   On: 02/19/2022 12:49   IR Angiogram Selective Each Additional Vessel  Result Date: 02/11/2022 INDICATION: 66 year old female with history of NASH cirrhosis and portal hypertension status post TIPS creation with recurrent hemorrhagic internal hemorrhoids (grade 4). Poor surgical candidate. EXAM: 1. Ultrasound-guided vascular access of the right common femoral artery. 2. Selective catheterization angiography of the inferior mesenteric artery and bilateral superior rectal arteries. 3. Particle and coil embolization of the bilateral superior rectal arteries. MEDICATIONS: None. ANESTHESIA/SEDATION: Moderate (conscious) sedation was employed during this procedure. A total of Versed 1.5 mg and Fentanyl 75 mcg was administered intravenously. Moderate Sedation Time: 93 minutes. The patient's level of consciousness and vital signs were monitored continuously by radiology nursing throughout the procedure under my direct supervision. CONTRAST:  74m OMNIPAQUE IOHEXOL 350 MG/ML SOLN FLUOROSCOPY: Radiation Exposure Index (as provided by the fluoroscopic device): 5355.7mGy Kerma COMPLICATIONS: None immediate. PROCEDURE: Informed consent was obtained from the patient following explanation of the procedure, risks, benefits and alternatives. The patient understands, agrees and consents for the procedure. All questions were addressed. A time out was performed prior to the initiation of the procedure. Maximal barrier sterile technique utilized including caps, mask, sterile gowns, sterile gloves, large sterile drape, hand hygiene, and Betadine prep. The right groin was prepped and draped in standard fashion. Preprocedure ultrasound evaluation demonstrated patency of the right common femoral artery. The procedure was planned. Subdermal Local anesthesia was administered with 1% lidocaine at the planned needle entry site. A small dermatotomy was created with  an 11 blade. Under direct ultrasound visualization, the right common femoral artery was accessed with a 21 gauge micropuncture needle. A permanent image was captured and stored the record. Micropuncture sheath was introduced and a limited right lower extremity angiogram was performed which demonstrated adequate puncture site for closure device use. A 0.035 inch J wire was then inserted and directed to the abdominal aorta under fluoroscopic guidance. The micropuncture sheath was exchanged for a 5 [place] 10 cm vascular sheath. A rim catheter was then introduced and attempts at selecting the inferior mesenteric artery were made which were unsuccessful. Therefore, the rim catheter was exchanged for a Mickelson catheter which successfully engaged the ostium of the inferior mesenteric artery. Inferior mesenteric angiogram demonstrated widely patent inferior mesenteric artery with hypertrophied bilateral superior rectal arteries which are tortuous distally where there was mild blushing compatible with known internal hemorrhoids. A 2.4 FP[place]great and 0.04 inch fathom microwire were then used to cannulate the right superior rectal artery. Superior rectal angiogram was performed which again demonstrated hypertrophy and tortuosity in  the region of the right-sided internal hemorrhoids. Particle embolization was performed with 400 micron Hydrospheres until near stasis was achieved. This was followed by coil embolization with 3 mm and 2 mm 0.018" Azur microcoils. Repeat superior rectal angiogram demonstrated successful embolization. The microcatheter and wire were then retracted and used to select the left superior rectal artery. Distal left superior rectal angiogram was performed which demonstrated hypertrophy and tortuosity in the region of the left internal hemorrhoids. Particle embolization was performed with 400 micron Hydrospheres until near stasis was achieved. This was followed by coil embolization with 3 mm and 2  mm 0.018" Azur microcoils. Repeat left superior rectal angiogram demonstrated successful embolization. Repeat angiogram from the proximal superior rectal artery was then performed which demonstrated middle rectal artery branches supplying the internal hemorrhoids., Therefore, proximal bilateral superior rectal artery was coil embolized with 3 mm and 4 mm Azur 0.018" microcoils. Completion superior rectal angiogram demonstrated successful embolization of the bilateral superior rectal and middle rectal arteries without evidence of opacification of the previously visualized internal hemorrhoids. The catheters were removed. The right common femoral artery was closed with a 6 French Angio-Seal device successfully. Peripheral pulses were unchanged. The patient tolerated the procedure well was transferred back to the floor in stable condition. IMPRESSION: Technically successful particle and coil embolization of the bilateral superior rectal arteries. Ruthann Cancer, MD [facility] Electronically Signed   By: Ruthann Cancer M.D.   On: 02/11/2022 14:10   US Paracentesis  Result Date: 02/11/2022 INDICATION: Patient with history of NASH cirrhosis and recurrent ascites status post tips procedure at [facility], maximum volume request of 8 L to be drawn off per ordering provider. EXAM: ULTRASOUND GUIDED  PARACENTESIS MEDICATIONS: Local 1% lidocaine only. COMPLICATIONS: None immediate. PROCEDURE: Informed written consent was obtained from the patient after a discussion of the risks, benefits and alternatives to treatment. A timeout was performed prior to the initiation of the procedure. Initial ultrasound scanning demonstrates a large amount of ascites within the left lower abdominal quadrant. The left lower abdomen was prepped and draped in the usual sterile fashion. 1% lidocaine was used for local anesthesia. Following this, a 19 gauge, 7-cm, Yueh catheter was introduced. An  ultrasound image was saved for documentation purposes. The paracentesis was performed. The catheter was removed and a dressing was applied. The patient tolerated the procedure well without immediate post procedural complication. Patient received post-procedure intravenous albumin; see nursing notes for details. FINDINGS: A total of approximately 7.2 L of clear yellow fluid was removed. IMPRESSION: Successful ultrasound-guided paracentesis yielding 7.2 liters of peritoneal fluid. PLAN: The patient has reportedly had a TIPS procedure at [facility] on 12/26/2021 and is awaiting liver transplant there, she is followed by the liver clinic at [facility]. This exam was performed by Tsosie Billing PA-C, and was supervised and interpreted by Dr. Kathlene Cote. Electronically Signed   By: Aletta Edouard M.D.   On: 02/11/2022 14:10   IR Angiogram Selective Each Additional Vessel  Result Date: 02/11/2022 INDICATION: 66 year old female with history of NASH cirrhosis and portal hypertension status post TIPS creation with recurrent hemorrhagic internal hemorrhoids (grade 4). Poor surgical candidate. EXAM: 1. Ultrasound-guided vascular access of the right common femoral artery. 2. Selective catheterization angiography of the inferior mesenteric artery and bilateral superior rectal arteries. 3. Particle and coil embolization of the bilateral superior rectal arteries. MEDICATIONS: None. ANESTHESIA/SEDATION: Moderate (conscious) sedation was employed during this procedure. A total of Versed 1.5 mg and Fentanyl 75 mcg  was administered intravenously. Moderate Sedation Time: 93 minutes. The patient's level of consciousness and vital signs were monitored continuously by radiology nursing throughout the procedure under my direct supervision. CONTRAST:  96m OMNIPAQUE IOHEXOL 350 MG/ML SOLN FLUOROSCOPY: Radiation Exposure Index (as provided by the fluoroscopic device): 5220.2mGy Kerma COMPLICATIONS: None immediate. PROCEDURE:  Informed consent was obtained from the patient following explanation of the procedure, risks, benefits and alternatives. The patient understands, agrees and consents for the procedure. All questions were addressed. A time out was performed prior to the initiation of the procedure. Maximal barrier sterile technique utilized including caps, mask, sterile gowns, sterile gloves, large sterile drape, hand hygiene, and Betadine prep. The right groin was prepped and draped in standard fashion. Preprocedure ultrasound evaluation demonstrated patency of the right common femoral artery. The procedure was planned. Subdermal Local anesthesia was administered with 1% lidocaine at the planned needle entry site. A small dermatotomy was created with an 11 blade. Under direct ultrasound visualization, the right common femoral artery was accessed with a 21 gauge micropuncture needle. A permanent image was captured and stored the record. Micropuncture sheath was introduced and a limited right lower extremity angiogram was performed which demonstrated adequate puncture site for closure device use. A 0.035 inch J wire was then inserted and directed to the abdominal aorta under fluoroscopic guidance. The micropuncture sheath was exchanged for a 5 [place] 10 cm vascular sheath. A rim catheter was then introduced and attempts at selecting the inferior mesenteric artery were made which were unsuccessful. Therefore, the rim catheter was exchanged for a Mickelson catheter which successfully engaged the ostium of the inferior mesenteric artery. Inferior mesenteric angiogram demonstrated widely patent inferior mesenteric artery with hypertrophied bilateral superior rectal arteries which are tortuous distally where there was mild blushing compatible with known internal hemorrhoids. A 2.4 [place]great and 0.04 inch fathom microwire were then used to cannulate the right superior rectal artery. Superior rectal angiogram was performed which again  demonstrated hypertrophy and tortuosity in the region of the right-sided internal hemorrhoids. Particle embolization was performed with 400 micron Hydrospheres until near stasis was achieved. This was followed by coil embolization with 3 mm and 2 mm 0.018" Azur microcoils. Repeat superior rectal angiogram demonstrated successful embolization. The microcatheter and wire were then retracted and used to select the left superior rectal artery. Distal left superior rectal angiogram was performed which demonstrated hypertrophy and tortuosity in the region of the left internal hemorrhoids. Particle embolization was performed with 400 micron Hydrospheres until near stasis was achieved. This was followed by coil embolization with 3 mm and 2 mm 0.018" Azur microcoils. Repeat left superior rectal angiogram demonstrated successful embolization. Repeat angiogram from the proximal superior rectal artery was then performed which demonstrated middle rectal artery branches supplying the internal hemorrhoids., Therefore, proximal bilateral superior rectal artery was coil embolized with 3 mm and 4 mm Azur 0.018" microcoils. Completion superior rectal angiogram demonstrated successful embolization of the bilateral superior rectal and middle rectal arteries without evidence of opacification of the previously visualized internal hemorrhoids. The catheters were removed. The right common femoral artery was closed with a 6 French Angio-Seal device successfully. Peripheral pulses were unchanged. The patient tolerated the procedure well was transferred back to the floor in stable condition. IMPRESSION: Technically successful particle and coil embolization of the bilateral superior rectal arteries. DRuthann Cancer MD [facility] Electronically Signed   By: DRuthann CancerM.D.   On: 02/11/2022 14:10   IR EMBO ARTERIAL  NOT HEMORR HEMANG INC GUIDE ROADMAPPING  Result Date:  02/10/2022 INDICATION: 66 year old female with history of NASH cirrhosis and portal hypertension status post TIPS creation with recurrent hemorrhagic internal hemorrhoids (grade 4). Poor surgical candidate. EXAM: 1. Ultrasound-guided vascular access of the right common femoral artery. 2. Selective catheterization angiography of the inferior mesenteric artery and bilateral superior rectal arteries. 3. Particle and coil embolization of the bilateral superior rectal arteries. MEDICATIONS: None. ANESTHESIA/SEDATION: Moderate (conscious) sedation was employed during this procedure. A total of Versed 1.5 mg and Fentanyl 75 mcg was administered intravenously. Moderate Sedation Time: 93 minutes. The patient's level of consciousness and vital signs were monitored continuously by radiology nursing throughout the procedure under my direct supervision. CONTRAST:  40m OMNIPAQUE IOHEXOL 350 MG/ML SOLN FLUOROSCOPY: Radiation Exposure Index (as provided by the fluoroscopic device): 5213.0mGy Kerma COMPLICATIONS: None immediate. PROCEDURE: Informed consent was obtained from the patient following explanation of the procedure, risks, benefits and alternatives. The patient understands, agrees and consents for the procedure. All questions were addressed. A time out was performed prior to the initiation of the procedure. Maximal barrier sterile technique utilized including caps, mask, sterile gowns, sterile gloves, large sterile drape, hand hygiene, and Betadine prep. The right groin was prepped and draped in standard fashion. Preprocedure ultrasound evaluation demonstrated patency of the right common femoral artery. The procedure was planned. Subdermal Local anesthesia was administered with 1% lidocaine at the planned needle entry site. A small dermatotomy was created with an 11 blade. Under direct ultrasound visualization, the right common femoral artery was accessed with a 21 gauge micropuncture needle. A permanent image was captured  and stored the record. Micropuncture sheath was introduced and a limited right lower extremity angiogram was performed which demonstrated adequate puncture site for closure device use. A 0.035 inch J wire was then inserted and directed to the abdominal aorta under fluoroscopic guidance. The micropuncture sheath was exchanged for a 5 [place] 10 cm vascular sheath. A rim catheter was then introduced and attempts at selecting the inferior mesenteric artery were made which were unsuccessful. Therefore, the rim catheter was exchanged for a Mickelson catheter which successfully engaged the ostium of the inferior mesenteric artery. Inferior mesenteric angiogram demonstrated widely patent inferior mesenteric artery with hypertrophied bilateral superior rectal arteries which are tortuous distally where there was mild blushing compatible with known internal hemorrhoids. A 2.4 [place] and 0.04 inch fathom microwire were then used to cannulate the right superior rectal artery. Superior rectal angiogram was performed which again demonstrated hypertrophy and tortuosity in the region of the right-sided internal hemorrhoids. Particle embolization was performed with 400 micron Hydrospheres until near stasis was achieved. This was followed by coil embolization with 3 mm and 2 mm 0.018" Azur microcoils. Repeat superior rectal angiogram demonstrated successful embolization. The microcatheter and wire were then retracted and used to select the left superior rectal artery. Distal left superior rectal angiogram was performed which demonstrated hypertrophy and tortuosity in the region of the left internal hemorrhoids. Particle embolization was performed with 400 micron Hydrospheres until near stasis was achieved. This was followed by coil embolization with 3 mm and 2 mm 0.018" Azur microcoils. Repeat left superior rectal angiogram demonstrated successful embolization. Repeat angiogram from the proximal superior rectal artery was  then performed which demonstrated middle rectal artery branches supplying the internal hemorrhoids., Therefore, proximal bilateral superior rectal artery was coil embolized with 3 mm and 4 mm Azur 0.018" microcoils. Completion superior rectal angiogram demonstrated successful embolization of the bilateral superior rectal  and middle rectal arteries without evidence of opacification of the previously visualized internal hemorrhoids. The catheters were removed. The right common femoral artery was closed with a 6 French Angio-Seal device successfully. Peripheral pulses were unchanged. The patient tolerated the procedure well was transferred back to the floor in stable condition. IMPRESSION: Technically successful particle and coil embolization of the bilateral superior rectal arteries. Ruthann Cancer, MD [facility] Radiology Electronically Signed   By: Ruthann Cancer M.D.   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 Poor surgical candidate. EXAM: 1. Ultrasound-guided vascular access of the right common femoral artery. 2. Selective catheterization angiography of the inferior mesenteric artery and bilateral superior rectal arteries. 3. Particle and coil embolization of the bilateral superior rectal arteries. MEDICATIONS: None. ANESTHESIA/SEDATION: Moderate (conscious) sedation was employed during this procedure. A total of Versed 1.5 mg and Fentanyl 75 mcg was administered intravenously. Moderate Sedation Time: 93 minutes. The patient's level of consciousness and vital signs were monitored continuously by radiology nursing throughout the procedure under my direct supervision. CONTRAST:  28m OMNIPAQUE IOHEXOL 350 MG/ML SOLN FLUOROSCOPY: Radiation Exposure Index (as  provided by the fluoroscopic device): 5353.6mGy Kerma COMPLICATIONS: None immediate. PROCEDURE: Informed consent was obtained from the patient following explanation of the procedure, risks, benefits and alternatives. The patient understands, agrees and consents for the procedure. All questions were addressed. A time out was performed prior to the initiation of the procedure. Maximal barrier sterile technique utilized including caps, mask, sterile gowns, sterile gloves, large sterile drape, hand hygiene, and Betadine prep. The right groin was prepped and draped in standard fashion. Preprocedure ultrasound evaluation demonstrated patency of the right common femoral artery. The procedure was planned. Subdermal Local anesthesia was administered with 1% lidocaine at the planned needle entry site. A small dermatotomy was created with an 11 blade. Under direct ultrasound visualization, the right common femoral artery was accessed with a 21 gauge micropuncture needle. A permanent image was captured and stored the record. Micropuncture sheath was introduced and a limited right lower extremity angiogram was performed which demonstrated adequate puncture site for closure device use. A 0.035 inch J wire was then inserted and directed to the abdominal aorta under fluoroscopic guidance. The micropuncture sheath was exchanged for a 5 [place] 10 cm vascular sheath. A rim catheter was then introduced and attempts at selecting the inferior mesenteric artery were made which were unsuccessful. Therefore, the rim catheter was exchanged for a Mickelson catheter which successfully engaged the ostium of the inferior mesenteric artery. Inferior mesenteric angiogram demonstrated widely patent inferior mesenteric artery with hypertrophied bilateral superior rectal arteries which are tortuous distally where there was mild blushing compatible with known internal hemorrhoids. A 2.4 [place] and 0.04 inch fathom microwire were then used to  cannulate the right superior rectal artery. Superior rectal angiogram was performed which again demonstrated hypertrophy and tortuosity in the region of the right-sided internal hemorrhoids. Particle embolization was performed with 400 micron Hydrospheres until near stasis was achieved. This was followed by coil embolization with 3 mm and 2 mm 0.018" Azur microcoils. Repeat superior rectal angiogram demonstrated successful embolization. The microcatheter and wire were then retracted and used to select the left superior rectal artery. Distal left superior rectal angiogram was performed which demonstrated hypertrophy and tortuosity in the region of the left  internal hemorrhoids. Particle embolization was performed with 400 micron Hydrospheres until near stasis was achieved. This was followed by coil embolization with 3 mm and 2 mm 0.018" Azur microcoils. Repeat left superior rectal angiogram demonstrated successful embolization. Repeat angiogram from the proximal superior rectal artery was then performed which demonstrated middle rectal artery branches supplying the internal hemorrhoids., Therefore, proximal bilateral superior rectal artery was coil embolized with 3 mm and 4 mm Azur 0.018" microcoils. Completion superior rectal angiogram demonstrated successful embolization of the bilateral superior rectal and middle rectal arteries without evidence of opacification of the previously visualized internal hemorrhoids. The catheters were removed. The right common femoral artery was closed with a 6 French Angio-Seal device successfully. Peripheral pulses were unchanged. The patient tolerated the procedure well was transferred back to the floor in stable condition. IMPRESSION: Technically successful particle and coil embolization of the bilateral superior rectal arteries. Ruthann Cancer, MD [facility] Radiology Electronically Signed   By: Ruthann Cancer M.D.   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 Poor surgical candidate. EXAM: 1. Ultrasound-guided vascular access of the right common femoral artery. 2. Selective catheterization angiography of the inferior mesenteric artery and bilateral superior rectal arteries. 3. Particle and coil embolization of the bilateral superior rectal arteries. MEDICATIONS: None. ANESTHESIA/SEDATION: Moderate (conscious) sedation was employed during this procedure. A total of Versed 1.5 mg and Fentanyl 75 mcg was administered intravenously. Moderate Sedation Time: 93 minutes. The patient's level of consciousness and vital signs were monitored continuously by radiology nursing throughout the procedure under my direct supervision. CONTRAST:  54m OMNIPAQUE IOHEXOL 350 MG/ML SOLN FLUOROSCOPY: Radiation Exposure Index (as provided by the fluoroscopic device): 5213.0mGy Kerma COMPLICATIONS: None immediate. PROCEDURE: Informed consent was obtained from the patient following explanation of the procedure, risks, benefits and alternatives. The patient understands, agrees and consents for the procedure. All questions were addressed. A time out was performed prior to the initiation of the procedure. Maximal barrier sterile technique utilized including caps, mask, sterile gowns, sterile gloves, large sterile drape, hand hygiene, and Betadine prep. The right groin was prepped and draped in standard fashion. Preprocedure ultrasound evaluation demonstrated patency of the right common femoral artery. The procedure was planned. Subdermal Local anesthesia was administered with 1% lidocaine at the planned needle entry site. A small dermatotomy was created with an 11 blade. Under direct ultrasound visualization, the right common femoral artery was accessed with a 21  gauge micropuncture needle. A permanent image was captured and stored the record. Micropuncture sheath was introduced and a limited right lower extremity angiogram was performed which demonstrated adequate puncture site for closure device use. A 0.035 inch J wire was then inserted and directed to the abdominal aorta under fluoroscopic guidance. The micropuncture sheath was exchanged for a 5 [place] 10 cm vascular sheath. A rim catheter was then introduced and attempts at selecting the inferior mesenteric artery were made which were unsuccessful. Therefore, the rim catheter was exchanged for a Mickelson catheter which successfully engaged the ostium of the inferior mesenteric artery. Inferior mesenteric angiogram demonstrated widely patent inferior mesenteric artery with hypertrophied bilateral superior rectal arteries which are tortuous distally where there was mild blushing compatible with known internal hemorrhoids. A 2.4 [place] and 0.04 inch fathom microwire were then used to cannulate the right superior rectal artery. Superior  rectal angiogram was performed which again demonstrated hypertrophy and tortuosity in the region of the right-sided internal hemorrhoids. Particle embolization was performed with 400 micron Hydrospheres until near stasis was achieved. This was followed by coil embolization with 3 mm and 2 mm 0.018" Azur microcoils. Repeat superior rectal angiogram demonstrated successful embolization. The microcatheter and wire were then retracted and used to select the left superior rectal artery. Distal left superior rectal angiogram was performed which demonstrated hypertrophy and tortuosity in the region of the left internal hemorrhoids. Particle embolization was performed with 400 micron Hydrospheres until near stasis was achieved. This was followed by coil embolization with 3 mm and 2 mm 0.018" Azur microcoils. Repeat left superior rectal angiogram demonstrated successful embolization.  Repeat angiogram from the proximal superior rectal artery was then performed which demonstrated middle rectal artery branches supplying the internal hemorrhoids., Therefore, proximal bilateral superior rectal artery was coil embolized with 3 mm and 4 mm Azur 0.018" microcoils. Completion superior rectal angiogram demonstrated successful embolization of the bilateral superior rectal and middle rectal arteries without evidence of opacification of the previously visualized internal hemorrhoids. The catheters were removed. The right common femoral artery was closed with a 6 French Angio-Seal device successfully. Peripheral pulses were unchanged. The patient tolerated the procedure well was transferred back to the floor in stable condition. IMPRESSION: Technically successful particle and coil embolization of the bilateral superior rectal arteries. Ruthann Cancer, MD [facility] Radiology Electronically Signed   By: Ruthann Cancer M.D.   On: 02/10/2022 09:18   DG Chest 2 View  Result Date: 02/08/2022 CLINICAL DATA:  Nonproductive cough. EXAM: CHEST - 2 VIEW COMPARISON:  01/27/2022 FINDINGS: The heart size and mediastinal contours are within normal limits. Both lungs are clear. Right shoulder prosthesis again noted. IMPRESSION: No active cardiopulmonary disease. Electronically Signed   By: Marlaine Hind M.D.   On: 02/08/2022 11:26   US LIVER DOPPLER  Result Date: 02/06/2022 CLINICAL DATA:  66 year old female with prior tips EXAM: DUPLEX ULTRASOUND OF LIVER TECHNIQUE: Color and duplex Doppler ultrasound was performed to evaluate the hepatic in-flow and out-flow vessels. COMPARISON:  None FINDINGS: Portal Vein Velocities Main:  32 cm/sec Right:  60 cm/sec Left:  18 cm/sec Stent: Proximal: 60 centimeter/second Mid: 119 centimeter/second Distal: 151 centimeter/second Hepatic Vein Velocities Right:  89 cm/sec Middle:  151 cm/sec Left:  66 cm/sec Hepatic Artery Velocity:  136  cm/sec Splenic Vein Velocity:  32 cm/sec Varices: Absent Ascites: Present Spleen diameter estimated diameter 17.5 cm IMPRESSION: Tip stent patent. Velocity measured in the proximal stent is below expected normal threshold, which can be seen with narrowing. Ascites Signed, Dulcy Fanny. Nadene Rubins, RPVI [facility] Radiology Electronically Signed   By: Corrie Mckusick D.O.   On: 02/06/2022 12:28    Microbiology: Results for orders placed or performed during the hospital encounter of 02/04/22  Culture, blood (Routine X 2) w Reflex to ID Panel     Status: None   Collection Time: 02/08/22  8:04 PM   Specimen: BLOOD  Result Value Ref Range Status   Specimen Description BLOOD BLOOD RIGHT HAND  Final   Special Requests   Final    BOTTLES DRAWN AEROBIC AND ANAEROBIC Blood Culture adequate volume   Culture   Final    NO GROWTH 6 DAYS Performed at [facility], [address] [place], [place] [address]    Report Status 02/14/2022 FINAL  Final  Culture, blood (Routine  X 2) w Reflex to ID Panel     Status: None   Collection Time: 02/08/22  8:20 PM   Specimen: BLOOD  Result Value Ref Range Status   Specimen Description BLOOD BLOOD LEFT HAND  Final   Special Requests   Final    BOTTLES DRAWN AEROBIC AND ANAEROBIC Blood Culture adequate volume   Culture   Final    NO GROWTH 6 DAYS Performed at [facility], [address] [place], [place] [address]    Report Status 02/14/2022 FINAL  Final    Labs: CBC: Recent Labs  Lab 03/05/22 1639 03/06/22 0104 03/06/22 0533 03/07/22 0447  WBC 4.2  --  4.6 4.5  HGB 5.1* 6.6* 7.6* 7.7*  HCT 17.0*  --  23.8* 24.2*  MCV 75.6*  --  78.8* 78.8*  PLT 127*  --  108* 537*   Basic Metabolic Panel: Recent Labs  Lab 03/05/22 1639 03/06/22 0533 03/07/22 0447  NA 137 138 141  K 3.4* 3.7 3.4*  CL 111 113* 114*  CO2 22 21* 22  GLUCOSE 247* 155* 205*  BUN 36* 34* 33*  CREATININE 1.58*  1.66* 1.54*  CALCIUM 8.2* 7.9* 8.2*   Liver Function Tests: Recent Labs  Lab 03/05/22 1639  AST 42*  ALT 24  ALKPHOS 111  BILITOT 1.5*  PROT 4.7*  ALBUMIN 2.5*   CBG: Recent Labs  Lab 03/06/22 1713 03/06/22 2054 03/06/22 2318 03/07/22 0846 03/07/22 1159  GLUCAP 121* 210* 207* 147* 206*    Discharge time spent: greater than 30 minutes.  Signed: Loletha Grayer, MD [facility] 03/07/2022

## 2022-03-08 LAB — TYPE AND SCREEN
ABO/RH(D): A POS
Antibody Screen: NEGATIVE
Unit division: 0
Unit division: 0
Unit division: 0
Unit division: 0

## 2022-03-08 LAB — BPAM RBC
Blood Product Expiration Date: 202308092359
Blood Product Expiration Date: 202308102359
Blood Product Expiration Date: 202308142359
Blood Product Expiration Date: 202308142359
ISSUE DATE / TIME: 202307191955
ISSUE DATE / TIME: 202307192201
ISSUE DATE / TIME: 202307200337
ISSUE DATE / TIME: 202307211110
Unit Type and Rh: 6200
Unit Type and Rh: 6200
Unit Type and Rh: 6200
Unit Type and Rh: 6200

## 2022-03-11 ENCOUNTER — Ambulatory Visit
Admission: RE | Admit: 2022-03-11 | Discharge: 2022-03-11 | Disposition: A | Discharge status: Home / Self Care | Payer: 59 | Source: Ambulatory Visit | Attending: Gastroenterology | Admitting: Gastroenterology

## 2022-03-11 DIAGNOSIS — K7581 Nonalcoholic steatohepatitis (NASH): Secondary | ICD-10-CM | POA: Diagnosis present

## 2022-03-11 DIAGNOSIS — K7460 Unspecified cirrhosis of liver: Secondary | ICD-10-CM | POA: Insufficient documentation

## 2022-03-11 DIAGNOSIS — R188 Other ascites: Secondary | ICD-10-CM | POA: Diagnosis not present

## 2022-03-11 MED ORDER — ALBUMIN HUMAN 25 % IV SOLN: 25.0000 g | Freq: Once | INTRAVENOUS | Status: AC

## 2022-03-11 MED ORDER — ALBUMIN HUMAN 25 % IV SOLN
INTRAVENOUS | Status: AC
  Administered 2022-03-11: 25 g via INTRAVENOUS
  Filled 2022-03-11: qty 100

## 2022-03-11 MED ORDER — ALBUMIN HUMAN 25 % IV SOLN
25.0000 g | Freq: Once | INTRAVENOUS | Status: AC
  Administered 2022-03-11: 25 g via INTRAVENOUS

## 2022-03-11 NOTE — Procedures (Signed)
PROCEDURE SUMMARY:  Successful ultrasound guided paracentesis from the RUQ. Yielded 7.2 L of clear yellow fluid.  No immediate complications.  The patient tolerated the procedure well.   Specimen was not sent for labs.  EBL <  58m  [facility], [facility]   MHedy Jacob PA-C 03/11/2022, 12:49 PM

## 2022-03-12 ENCOUNTER — Other Ambulatory Visit (HOSPITAL_COMMUNITY): Payer: Self-pay | Admitting: Physician Assistant

## 2022-03-12 DIAGNOSIS — D500 Iron deficiency anemia secondary to blood loss (chronic): Secondary | ICD-10-CM

## 2022-03-12 NOTE — Progress Notes (Signed)
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  She received 3 units PRBC during hospitalization as well as 400 mg IV Venofer.  Per discharge summary, it was recommended that she have CBC/blood bank sample via hematology on a weekly basis.  We will plan on transfusing if Hgb <7.0 (baseline Hgb around 8.0 due to thalassemia).  We will check full lab panel this week (CBC/diff + CMP + ferritin + iron/TIBC + B12 + methylmalonic + vitamin D) and see her for follow-up next week.

## 2022-03-13 ENCOUNTER — Inpatient Hospital Stay (HOSPITAL_COMMUNITY): Payer: 59

## 2022-03-13 DIAGNOSIS — E559 Vitamin D deficiency, unspecified: Secondary | ICD-10-CM

## 2022-03-13 DIAGNOSIS — D563 Thalassemia minor: Secondary | ICD-10-CM

## 2022-03-13 DIAGNOSIS — D500 Iron deficiency anemia secondary to blood loss (chronic): Secondary | ICD-10-CM

## 2022-03-13 DIAGNOSIS — E538 Deficiency of other specified B group vitamins: Secondary | ICD-10-CM | POA: Diagnosis not present

## 2022-03-13 DIAGNOSIS — D649 Anemia, unspecified: Secondary | ICD-10-CM

## 2022-03-13 LAB — CBC WITH DIFFERENTIAL/PLATELET
Abs Immature Granulocytes: 0.02 10*3/uL (ref 0.00–0.07)
Basophils Absolute: 0 10*3/uL (ref 0.0–0.1)
Basophils Relative: 0 %
Eosinophils Absolute: 0.1 10*3/uL (ref 0.0–0.5)
Eosinophils Relative: 5 %
HCT: 24.5 % — ABNORMAL LOW (ref 36.0–46.0)
Hemoglobin: 7.6 g/dL — ABNORMAL LOW (ref 12.0–15.0)
Immature Granulocytes: 1 %
Lymphocytes Relative: 16 %
Lymphs Abs: 0.4 10*3/uL — ABNORMAL LOW (ref 0.7–4.0)
MCH: 25 pg — ABNORMAL LOW (ref 26.0–34.0)
MCHC: 31 g/dL (ref 30.0–36.0)
MCV: 80.6 fL (ref 80.0–100.0)
Monocytes Absolute: 0.2 10*3/uL (ref 0.1–1.0)
Monocytes Relative: 7 %
Neutro Abs: 2 10*3/uL (ref 1.7–7.7)
Neutrophils Relative %: 71 %
Platelets: 120 10*3/uL — ABNORMAL LOW (ref 150–400)
RBC: 3.04 MIL/uL — ABNORMAL LOW (ref 3.87–5.11)
RDW: 21.7 % — ABNORMAL HIGH (ref 11.5–15.5)
WBC: 2.8 10*3/uL — ABNORMAL LOW (ref 4.0–10.5)
nRBC: 0 % (ref 0.0–0.2)

## 2022-03-13 LAB — COMPREHENSIVE METABOLIC PANEL
ALT: 27 U/L (ref 0–44)
AST: 49 U/L — ABNORMAL HIGH (ref 15–41)
Albumin: 2.6 g/dL — ABNORMAL LOW (ref 3.5–5.0)
Alkaline Phosphatase: 109 U/L (ref 38–126)
Anion gap: 4 — ABNORMAL LOW (ref 5–15)
BUN: 35 mg/dL — ABNORMAL HIGH (ref 8–23)
CO2: 23 mmol/L (ref 22–32)
Calcium: 8.2 mg/dL — ABNORMAL LOW (ref 8.9–10.3)
Chloride: 109 mmol/L (ref 98–111)
Creatinine, Ser: 1.68 mg/dL — ABNORMAL HIGH (ref 0.44–1.00)
GFR, Estimated: 34 mL/min — ABNORMAL LOW (ref >60)
Glucose, Bld: 227 mg/dL — ABNORMAL HIGH (ref 70–99)
Potassium: 3.9 mmol/L (ref 3.5–5.1)
Sodium: 136 mmol/L (ref 135–145)
Total Bilirubin: 1.8 mg/dL — ABNORMAL HIGH (ref 0.3–1.2)
Total Protein: 4.9 g/dL — ABNORMAL LOW (ref 6.5–8.1)

## 2022-03-13 LAB — FERRITIN: Ferritin: 311 ng/mL — ABNORMAL HIGH (ref 11–307)

## 2022-03-13 LAB — SAMPLE TO BLOOD BANK

## 2022-03-13 LAB — VITAMIN D 25 HYDROXY (VIT D DEFICIENCY, FRACTURES): Vit D, 25-Hydroxy: 23.09 ng/mL — ABNORMAL LOW (ref 30–100)

## 2022-03-13 LAB — IRON AND TIBC
Iron: 79 ug/dL (ref 28–170)
Saturation Ratios: 47 % — ABNORMAL HIGH (ref 10.4–31.8)
TIBC: 166 ug/dL — ABNORMAL LOW (ref 250–450)
UIBC: 87 ug/dL

## 2022-03-13 LAB — VITAMIN B12: Vitamin B-12: 750 pg/mL (ref 180–914)

## 2022-03-13 NOTE — Progress Notes (Unsigned)
HGB 7.6 . Per R. Pennington's order transfuse patient if HGB < 7. No blood products needed. Scheduling and patient notified of canceled appointment. D. Duggins called patient.

## 2022-03-14 ENCOUNTER — Inpatient Hospital Stay (HOSPITAL_COMMUNITY): Payer: 59

## 2022-03-14 ENCOUNTER — Inpatient Hospital Stay (HOSPITAL_COMMUNITY): Payer: 59 | Admitting: Physician Assistant

## 2022-03-16 LAB — METHYLMALONIC ACID, SERUM: Methylmalonic Acid, Quantitative: 540 nmol/L — ABNORMAL HIGH (ref 0–378)

## 2022-03-18 ENCOUNTER — Encounter: Payer: Self-pay | Admitting: *Deleted

## 2022-03-18 ENCOUNTER — Ambulatory Visit
Admission: RE | Admit: 2022-03-18 | Discharge: 2022-03-18 | Disposition: A | Discharge status: Home / Self Care | Payer: 59 | Source: Ambulatory Visit | Attending: Radiology | Admitting: Radiology

## 2022-03-18 ENCOUNTER — Ambulatory Visit
Admission: RE | Admit: 2022-03-18 | Discharge: 2022-03-18 | Disposition: A | Discharge status: Home / Self Care | Payer: 59 | Source: Ambulatory Visit | Attending: Gastroenterology | Admitting: Gastroenterology

## 2022-03-18 DIAGNOSIS — R188 Other ascites: Secondary | ICD-10-CM | POA: Diagnosis not present

## 2022-03-18 DIAGNOSIS — Z9889 Other specified postprocedural states: Secondary | ICD-10-CM | POA: Diagnosis not present

## 2022-03-18 DIAGNOSIS — K7581 Nonalcoholic steatohepatitis (NASH): Secondary | ICD-10-CM | POA: Insufficient documentation

## 2022-03-18 DIAGNOSIS — K7460 Unspecified cirrhosis of liver: Secondary | ICD-10-CM | POA: Diagnosis present

## 2022-03-18 DIAGNOSIS — K643 Fourth degree hemorrhoids: Secondary | ICD-10-CM | POA: Diagnosis not present

## 2022-03-18 HISTORY — PX: IR RADIOLOGIST EVAL & MGMT: IMG5224

## 2022-03-18 MED ORDER — ALBUMIN HUMAN 25 % IV SOLN: 25.0000 g | Freq: Once | INTRAVENOUS | Status: AC

## 2022-03-18 MED ORDER — ALBUMIN HUMAN 25 % IV SOLN
INTRAVENOUS | Status: AC
  Administered 2022-03-18: 25 g via INTRAVENOUS
  Filled 2022-03-18: qty 100

## 2022-03-18 NOTE — Progress Notes (Signed)
Referring Physician(s): Raylene Miyamoto, MD  Reason for follow up:  1 month status post emborrhoid, virtual telephone visit  History of present illness: 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  She is considered a poor surgical candidate.  Therefore, she is status post superior rectal artery embolization on 02/07/22 at [facility]  The procedure went as planned without complication.  She was discharged home on 02/10/22 after no further bleeding was noted and her anemia was stable.  Unfortunately, some bleeding continued after discharge and was related to increased ascites, therefore was re-admitted requiring additional blood transfusion from 03/05/22 to 03/07/22.    She did undergo TIPS revision last week at [facility], by report increased TIPS diameter from 8 to 10 mm.  Going for paracentesis today.  She reports worsening hemorrhoidal bleeding after TIPS revision, with exception of one day without any bleeding.  She describes the bleeding as a slow drip, still improved from prior to procedure when there was pouring of blood from the hemorrhoids.  She has not followed up with GI in [place] since discharge.  Past Medical History:  Diagnosis Date   Anxiety    Arthritis    Cirrhosis of liver ([place])    Diabetes mellitus without complication (HCC)    Dyspnea    DOE   GERD (gastroesophageal reflux disease)    Hepatitis    PAST   Hypertension    Neuropathy    Neuropathy, diabetic ([place])    Pneumonia    Sinus complaint    Sleep apnea    CPAP   Thalassemia minor 1992    Past Surgical History:  Procedure Laterality Date   ABDOMINAL HYSTERECTOMY     BREAST BIOPSY Right 02/15/2018   [place] bx 6-6:30 ribbon shape, ONE CORE FRAGMENT WITH FIBROSIS. ONE CORE FRAGMENT WITH PORTION OF A DILATED   BREAST BIOPSY Right 02/15/2018   [place] bx 9:00 heart shape, USUAL  DUCTAL HYPERPLASIA   BREAST LUMPECTOMY Right 03/09/2018   Procedure: BREAST LUMPECTOMY x 2;  Surgeon: Benjamine Sprague, DO;  Location: [facility] ORS;  Service: General;  Laterality: Right;   CATARACT EXTRACTION W/PHACO Left 06/11/2016   Procedure: CATARACT EXTRACTION PHACO AND INTRAOCULAR LENS PLACEMENT (IOC);  Surgeon: Estill Cotta, MD;  Location: [facility] ORS;  Service: Ophthalmology;  Laterality: Left;  Lot # [id number] H US:01:38.6 AP%:26.4 CDE:44.15   CATARACT EXTRACTION W/PHACO Right 06/15/2018   Procedure: CATARACT EXTRACTION PHACO AND INTRAOCULAR LENS PLACEMENT (IOC);  Surgeon: Birder Robson, MD;  Location: [facility] ORS;  Service: Ophthalmology;  Laterality: Right;  [place] 00:38.2 CDE 4.23 Fluid Pack Lot # [id number]   COLONOSCOPIES     COLONOSCOPY WITH PROPOFOL N/A 10/10/2020   Procedure: COLONOSCOPY WITH PROPOFOL;  Surgeon: Lesly Rubenstein, MD;  Location: [facility] ENDOSCOPY;  Service: Endoscopy;  Laterality: N/A;   COLONOSCOPY WITH PROPOFOL N/A 11/20/2020   Procedure: COLONOSCOPY WITH PROPOFOL;  Surgeon: Lesly Rubenstein, MD;  Location: [facility] ENDOSCOPY;  Service: Endoscopy;  Laterality: N/A;  DM STAT CBC, BMP COVID POSITIVE 09/02/2020   CTR     ESOPHAGOGASTRODUODENOSCOPY (EGD) WITH PROPOFOL N/A 10/10/2020   Procedure: ESOPHAGOGASTRODUODENOSCOPY (EGD) WITH PROPOFOL;  Surgeon: Lesly Rubenstein, MD;  Location: [facility] ENDOSCOPY;  Service: Endoscopy;  Laterality: N/A;  COVID POSITIVE 10/08/2020   FLEXIBLE SIGMOIDOSCOPY N/A 02/06/2022   Procedure: FLEXIBLE SIGMOIDOSCOPY;  Surgeon: Lesly Rubenstein, MD;  Location: [facility] ENDOSCOPY;  Service: Endoscopy;  Laterality: N/A;  Patient requests anesthesia   IR ANGIOGRAM SELECTIVE EACH ADDITIONAL VESSEL  02/07/2022   IR ANGIOGRAM SELECTIVE EACH ADDITIONAL VESSEL  02/07/2022   IR ANGIOGRAM VISCERAL SELECTIVE  02/07/2022   IR EMBO ARTERIAL NOT HEMORR HEMANG INC GUIDE ROADMAPPING  02/07/2022   IR PARACENTESIS  12/17/2021   IR US GUIDE VASC ACCESS RIGHT  02/07/2022   JOINT  REPLACEMENT     KNEE SURGERY Right 09/24/2017   plates and pins   TOTAL SHOULDER ARTHROPLASTY Right 09/27/2015    Allergies: Gramineae pollens and Latex  Medications: Prior to Admission medications   Medication Sig Start Date End Date Taking? Authorizing Provider  acetaminophen (TYLENOL) 650 MG CR tablet Take 1,300 mg by mouth daily as needed for pain.    [provider]  albuterol (VENTOLIN HFA) 108 (90 Base) MCG/ACT inhaler Inhale 2 puffs into the lungs every 6 (six) hours as needed for wheezing or shortness of breath. 02/10/22   Loletha Grayer, MD  atorvastatin (LIPITOR) 10 MG tablet Take 10 mg by mouth daily. 01/28/21   [provider]  Cholecalciferol 50 MCG (2000 UT) CAPS Take 2,000 Units by mouth daily.    [provider]  diphenhydrAMINE (BENADRYL) 25 MG tablet Take 25 mg by mouth 2 (two) times daily as needed for itching.    [provider]  gabapentin (NEURONTIN) 400 MG capsule Take 800 mg by mouth 2 (two) times daily. 01/07/18   [provider]  hydrocortisone (ANUSOL-HC) 2.5 % rectal cream Place rectally 4 (four) times daily. 03/07/22   Loletha Grayer, MD  hydrocortisone (ANUSOL-HC) 25 MG suppository Place 1 suppository (25 mg total) rectally 2 (two) times daily. 01/30/22   Roxan Hockey, MD  lactulose, encephalopathy, (CHRONULAC) 10 GM/15ML SOLN Take 30 mLs (20 g total) by mouth daily. 01/30/22   Roxan Hockey, MD  melatonin 5 MG TABS Take 10 mg by mouth at bedtime as needed (sleep).    [provider]  Multiple Vitamins-Minerals (MULTIVITAMIN WITH MINERALS) tablet Take 1 tablet by mouth daily.    [provider]  ondansetron (ZOFRAN-ODT) 4 MG disintegrating tablet Take 1 tablet (4 mg total) by mouth every 8 (eight) hours as needed for nausea or vomiting. 01/30/22   Roxan Hockey, MD  sertraline (ZOLOFT) 50 MG tablet Take 50 mg by mouth daily. 12/12/17   [provider]  sodium chloride (OCEAN) 0.65 %  SOLN nasal spray Place 1 spray into both nostrils as needed for congestion. 02/10/22   Loletha Grayer, MD  vitamin B-12 (CYANOCOBALAMIN) 1000 MCG tablet Take 1,000 mcg by mouth daily.    [provider]  witch hazel-glycerin (TUCKS) pad Apply topically as needed for hemorrhoids. 03/07/22   Loletha Grayer, MD     Family History  Problem Relation Age of Onset   Breast cancer Mother 50   Lymphoma Mother    Diabetes Father    Kidney cancer Father    Heart disease Father    Diabetes Sister    Breast cancer Sister 30    Social History   Socioeconomic History   Marital status: Married    Spouse name: Not on file   Number of children: 0   Years of education: Not on file   Highest education level: Not on file  Occupational History   Occupation: EMPLOYED  Tobacco Use   Smoking status: Never   Smokeless tobacco: Never  Vaping Use   Vaping Use: Never used  Substance and Sexual Activity   Alcohol use: No  Drug use: Never   Sexual activity: Not Currently  Other Topics Concern   Not on file  Social History Narrative   Not on file   Social Determinants of Health   Financial Resource Strain: Low Risk  (01/31/2021)   Overall Financial Resource Strain (CARDIA)    Difficulty of Paying Living Expenses: Not hard at all  Food Insecurity: No Food Insecurity (01/31/2021)   Hunger Vital Sign    Worried About Running Out of Food in the Last Year: Never true    [place] in the Last Year: Never true  Transportation Needs: No Transportation Needs (01/31/2021)   PRAPARE - Hydrologist (Medical): No    Lack of Transportation (Non-Medical): No  Physical Activity: Inactive (01/31/2021)   Exercise Vital Sign    Days of Exercise per Week: 0 days    Minutes of Exercise per Session: 0 min  Stress: No Stress Concern Present (01/31/2021)   [place]    Feeling of Stress : Not at all   Social Connections: Moderately Isolated (01/31/2021)   Social Connection and Isolation Panel [NHANES]    Frequency of Communication with Friends and Family: More than three times a week    Frequency of Social Gatherings with Friends and Family: Once a week    Attends Religious Services: Never    Marine scientist or Organizations: No    Attends Archivist Meetings: Never    Marital Status: Married     Vital Signs: There were no vitals taken for this visit.  No physical examination was performed in lieu of virtual telephone clinic visit.   Imaging: No new pertinent imaging.  Labs:  CBC: Recent Labs    03/05/22 1639 03/06/22 0104 03/06/22 0533 03/07/22 0447 03/13/22 1315  WBC 4.2  --  4.6 4.5 2.8*  HGB 5.1* 6.6* 7.6* 7.7* 7.6*  HCT 17.0*  --  23.8* 24.2* 24.5*  PLT 127*  --  108* 117* 120*    COAGS: Recent Labs    01/27/22 1808 01/28/22 1707 02/04/22 1245 03/05/22 1650  INR 1.2 1.2 1.3* 1.2  APTT  --   --  31  --     BMP: Recent Labs    03/05/22 1639 03/06/22 0533 03/07/22 0447 03/13/22 1315  NA 137 138 141 136  K 3.4* 3.7 3.4* 3.9  CL 111 113* 114* 109  CO2 22 21* 22 23  GLUCOSE 247* 155* 205* 227*  BUN 36* 34* 33* 35*  CALCIUM 8.2* 7.9* 8.2* 8.2*  CREATININE 1.58* 1.66* 1.54* 1.68*  GFRNONAA 36* 34* 37* 34*    LIVER FUNCTION TESTS: Recent Labs    02/07/22 0549 02/10/22 0517 03/05/22 1639 03/13/22 1315  BILITOT 1.6* 1.4* 1.5* 1.8*  AST 36 47* 42* 49*  ALT 23 24 24 27   ALKPHOS 83 99 111 109  PROT 4.3* 4.9* 4.7* 4.9*  ALBUMIN 2.2* 2.4* 2.5* 2.6*    Assessment and Plan: 66 year old female with history of NASH cirrhosis and grade 4 hemorrhagic internal hemorrhoids status post bilateral superior rectal artery embolization on 02/10/22.  This was initially successful in significantly decreasing her bleeding, however bleeding has returned, though not as significant as prior to procedure.  She is status post TIPS creation in May  2023 at [facility], which has not alleviated her ascites production, therefore underwent TIPS reduction last week and the bleeding has suprisingly increased since then.  Etiology  is likely multifactorial from hematologic/coagulopathic, portal hypertension, anatomic standpoints.  Unlikely to have this early of arterial recanalization of superior/middle rectal arteries.  Therefore, recommend outpatient follow up with GI regarding any potential interventions they may be able to offer.  If bleeding worsens or continues despite this, would consider repeat angiogram to assess for any recanalization.    Plan to follow up in 6 weeks to assess progress.  Electronically Signed: Rosanne Ashing Karmyn Lowman 03/18/2022, 10:22 AM   I spent a total of 25 Minutes in virtual telephone clinical consultation, greater than 50% of which was counseling/coordinating care for hemorrhagic internal hemorrhoids.

## 2022-03-20 ENCOUNTER — Inpatient Hospital Stay: Payer: 59 | Attending: Physician Assistant

## 2022-03-20 ENCOUNTER — Encounter (HOSPITAL_COMMUNITY): Payer: Self-pay

## 2022-03-20 ENCOUNTER — Other Ambulatory Visit (HOSPITAL_COMMUNITY): Payer: 59

## 2022-03-20 ENCOUNTER — Inpatient Hospital Stay: Payer: 59 | Admitting: Physician Assistant

## 2022-03-20 ENCOUNTER — Inpatient Hospital Stay (HOSPITAL_COMMUNITY)
Admission: EM | Admit: 2022-03-20 | Discharge: 2022-03-28 | DRG: 394 | Disposition: A | Discharge status: Home / Self Care | Payer: 59 | Attending: Internal Medicine | Admitting: Internal Medicine

## 2022-03-20 ENCOUNTER — Other Ambulatory Visit: Payer: Self-pay

## 2022-03-20 DIAGNOSIS — F32A Depression, unspecified: Secondary | ICD-10-CM | POA: Diagnosis not present

## 2022-03-20 DIAGNOSIS — R188 Other ascites: Secondary | ICD-10-CM | POA: Diagnosis present

## 2022-03-20 DIAGNOSIS — D649 Anemia, unspecified: Principal | ICD-10-CM

## 2022-03-20 DIAGNOSIS — F419 Anxiety disorder, unspecified: Secondary | ICD-10-CM | POA: Diagnosis not present

## 2022-03-20 DIAGNOSIS — E1122 Type 2 diabetes mellitus with diabetic chronic kidney disease: Secondary | ICD-10-CM | POA: Insufficient documentation

## 2022-03-20 DIAGNOSIS — Z803 Family history of malignant neoplasm of breast: Secondary | ICD-10-CM

## 2022-03-20 DIAGNOSIS — E1140 Type 2 diabetes mellitus with diabetic neuropathy, unspecified: Secondary | ICD-10-CM | POA: Diagnosis present

## 2022-03-20 DIAGNOSIS — Z7682 Awaiting organ transplant status: Secondary | ICD-10-CM | POA: Diagnosis not present

## 2022-03-20 DIAGNOSIS — E538 Deficiency of other specified B group vitamins: Secondary | ICD-10-CM | POA: Insufficient documentation

## 2022-03-20 DIAGNOSIS — K625 Hemorrhage of anus and rectum: Secondary | ICD-10-CM

## 2022-03-20 DIAGNOSIS — D563 Thalassemia minor: Secondary | ICD-10-CM | POA: Diagnosis not present

## 2022-03-20 DIAGNOSIS — K766 Portal hypertension: Secondary | ICD-10-CM | POA: Diagnosis not present

## 2022-03-20 DIAGNOSIS — Z794 Long term (current) use of insulin: Secondary | ICD-10-CM

## 2022-03-20 DIAGNOSIS — K922 Gastrointestinal hemorrhage, unspecified: Secondary | ICD-10-CM | POA: Diagnosis present

## 2022-03-20 DIAGNOSIS — E1169 Type 2 diabetes mellitus with other specified complication: Secondary | ICD-10-CM

## 2022-03-20 DIAGNOSIS — K7460 Unspecified cirrhosis of liver: Secondary | ICD-10-CM | POA: Insufficient documentation

## 2022-03-20 DIAGNOSIS — D561 Beta thalassemia: Secondary | ICD-10-CM | POA: Insufficient documentation

## 2022-03-20 DIAGNOSIS — I129 Hypertensive chronic kidney disease with stage 1 through stage 4 chronic kidney disease, or unspecified chronic kidney disease: Secondary | ICD-10-CM | POA: Insufficient documentation

## 2022-03-20 DIAGNOSIS — Z8616 Personal history of COVID-19: Secondary | ICD-10-CM | POA: Diagnosis not present

## 2022-03-20 DIAGNOSIS — E669 Obesity, unspecified: Secondary | ICD-10-CM | POA: Diagnosis not present

## 2022-03-20 DIAGNOSIS — K7469 Other cirrhosis of liver: Secondary | ICD-10-CM | POA: Diagnosis not present

## 2022-03-20 DIAGNOSIS — E785 Hyperlipidemia, unspecified: Secondary | ICD-10-CM | POA: Diagnosis not present

## 2022-03-20 DIAGNOSIS — Z833 Family history of diabetes mellitus: Secondary | ICD-10-CM

## 2022-03-20 DIAGNOSIS — K219 Gastro-esophageal reflux disease without esophagitis: Secondary | ICD-10-CM | POA: Diagnosis not present

## 2022-03-20 DIAGNOSIS — Z6836 Body mass index (BMI) 36.0-36.9, adult: Secondary | ICD-10-CM | POA: Diagnosis not present

## 2022-03-20 DIAGNOSIS — Z90710 Acquired absence of both cervix and uterus: Secondary | ICD-10-CM | POA: Insufficient documentation

## 2022-03-20 DIAGNOSIS — K643 Fourth degree hemorrhoids: Principal | ICD-10-CM | POA: Diagnosis present

## 2022-03-20 DIAGNOSIS — Z807 Family history of other malignant neoplasms of lymphoid, hematopoietic and related tissues: Secondary | ICD-10-CM

## 2022-03-20 DIAGNOSIS — Z79899 Other long term (current) drug therapy: Secondary | ICD-10-CM

## 2022-03-20 DIAGNOSIS — I868 Varicose veins of other specified sites: Secondary | ICD-10-CM | POA: Diagnosis not present

## 2022-03-20 DIAGNOSIS — N1832 Chronic kidney disease, stage 3b: Secondary | ICD-10-CM

## 2022-03-20 DIAGNOSIS — Z8051 Family history of malignant neoplasm of kidney: Secondary | ICD-10-CM | POA: Insufficient documentation

## 2022-03-20 DIAGNOSIS — E876 Hypokalemia: Secondary | ICD-10-CM | POA: Diagnosis present

## 2022-03-20 DIAGNOSIS — K7581 Nonalcoholic steatohepatitis (NASH): Secondary | ICD-10-CM | POA: Diagnosis not present

## 2022-03-20 DIAGNOSIS — N179 Acute kidney failure, unspecified: Secondary | ICD-10-CM

## 2022-03-20 DIAGNOSIS — Z8249 Family history of ischemic heart disease and other diseases of the circulatory system: Secondary | ICD-10-CM

## 2022-03-20 DIAGNOSIS — D62 Acute posthemorrhagic anemia: Secondary | ICD-10-CM | POA: Diagnosis not present

## 2022-03-20 DIAGNOSIS — E559 Vitamin D deficiency, unspecified: Secondary | ICD-10-CM | POA: Insufficient documentation

## 2022-03-20 DIAGNOSIS — Z96611 Presence of right artificial shoulder joint: Secondary | ICD-10-CM | POA: Diagnosis present

## 2022-03-20 DIAGNOSIS — R5382 Chronic fatigue, unspecified: Secondary | ICD-10-CM | POA: Insufficient documentation

## 2022-03-20 DIAGNOSIS — Z8542 Personal history of malignant neoplasm of other parts of uterus: Secondary | ICD-10-CM | POA: Insufficient documentation

## 2022-03-20 DIAGNOSIS — D500 Iron deficiency anemia secondary to blood loss (chronic): Secondary | ICD-10-CM | POA: Insufficient documentation

## 2022-03-20 LAB — CBC WITH DIFFERENTIAL/PLATELET
Abs Immature Granulocytes: 0.1 10*3/uL — ABNORMAL HIGH (ref 0.00–0.07)
Basophils Absolute: 0 10*3/uL (ref 0.0–0.1)
Basophils Relative: 0 %
Eosinophils Absolute: 0.3 10*3/uL (ref 0.0–0.5)
Eosinophils Relative: 4 %
HCT: 13.4 % — ABNORMAL LOW (ref 36.0–46.0)
Hemoglobin: 4 g/dL — CL (ref 12.0–15.0)
Immature Granulocytes: 1 %
Lymphocytes Relative: 19 %
Lymphs Abs: 1.4 10*3/uL (ref 0.7–4.0)
MCH: 23.4 pg — ABNORMAL LOW (ref 26.0–34.0)
MCHC: 29.9 g/dL — ABNORMAL LOW (ref 30.0–36.0)
MCV: 78.4 fL — ABNORMAL LOW (ref 80.0–100.0)
Monocytes Absolute: 0.6 10*3/uL (ref 0.1–1.0)
Monocytes Relative: 8 %
Neutro Abs: 5 10*3/uL (ref 1.7–7.7)
Neutrophils Relative %: 68 %
Platelets: 211 10*3/uL (ref 150–400)
RBC: 1.71 MIL/uL — ABNORMAL LOW (ref 3.87–5.11)
RDW: 22.1 % — ABNORMAL HIGH (ref 11.5–15.5)
WBC: 7.4 10*3/uL (ref 4.0–10.5)
nRBC: 0.5 % — ABNORMAL HIGH (ref 0.0–0.2)

## 2022-03-20 LAB — COMPREHENSIVE METABOLIC PANEL
ALT: 25 U/L (ref 0–44)
AST: 45 U/L — ABNORMAL HIGH (ref 15–41)
Albumin: 2.4 g/dL — ABNORMAL LOW (ref 3.5–5.0)
Alkaline Phosphatase: 104 U/L (ref 38–126)
Anion gap: 6 (ref 5–15)
BUN: 39 mg/dL — ABNORMAL HIGH (ref 8–23)
CO2: 21 mmol/L — ABNORMAL LOW (ref 22–32)
Calcium: 8.3 mg/dL — ABNORMAL LOW (ref 8.9–10.3)
Chloride: 108 mmol/L (ref 98–111)
Creatinine, Ser: 2.19 mg/dL — ABNORMAL HIGH (ref 0.44–1.00)
GFR, Estimated: 25 mL/min — ABNORMAL LOW (ref >60)
Glucose, Bld: 200 mg/dL — ABNORMAL HIGH (ref 70–99)
Potassium: 4.4 mmol/L (ref 3.5–5.1)
Sodium: 135 mmol/L (ref 135–145)
Total Bilirubin: 2.1 mg/dL — ABNORMAL HIGH (ref 0.3–1.2)
Total Protein: 4.5 g/dL — ABNORMAL LOW (ref 6.5–8.1)

## 2022-03-20 LAB — CBC
HCT: 13.2 % — ABNORMAL LOW (ref 36.0–46.0)
Hemoglobin: 3.9 g/dL — CL (ref 12.0–15.0)
MCH: 23.5 pg — ABNORMAL LOW (ref 26.0–34.0)
MCHC: 29.5 g/dL — ABNORMAL LOW (ref 30.0–36.0)
MCV: 79.5 fL — ABNORMAL LOW (ref 80.0–100.0)
Platelets: 205 10*3/uL (ref 150–400)
RBC: 1.66 MIL/uL — ABNORMAL LOW (ref 3.87–5.11)
RDW: 22.2 % — ABNORMAL HIGH (ref 11.5–15.5)
WBC: 6.9 10*3/uL (ref 4.0–10.5)
nRBC: 0.9 % — ABNORMAL HIGH (ref 0.0–0.2)

## 2022-03-20 LAB — CBG MONITORING, ED: Glucose-Capillary: 167 mg/dL — ABNORMAL HIGH (ref 70–99)

## 2022-03-20 LAB — PREPARE RBC (CROSSMATCH)

## 2022-03-20 LAB — SAMPLE TO BLOOD BANK

## 2022-03-20 MED ORDER — ALBUTEROL SULFATE HFA 108 (90 BASE) MCG/ACT IN AERS: 2.0000 | INHALATION_SPRAY | Freq: Four times a day (QID) | RESPIRATORY_TRACT | Status: DC | PRN

## 2022-03-20 MED ORDER — HYDROCORTISONE (PERIANAL) 2.5 % EX CREA
TOPICAL_CREAM | Freq: Four times a day (QID) | CUTANEOUS | Status: DC
Start: 2022-03-21 — End: 2022-03-28
  Filled 2022-03-20 (×3): qty 28.35

## 2022-03-20 MED ORDER — FUROSEMIDE 10 MG/ML IJ SOLN
20.0000 mg | Freq: Once | INTRAMUSCULAR | Status: AC
  Administered 2022-03-20: 20 mg via INTRAVENOUS
  Filled 2022-03-20: qty 2

## 2022-03-20 MED ORDER — INSULIN ASPART 100 UNIT/ML IJ SOLN: 0.0000 [IU] | Freq: Every day | INTRAMUSCULAR | Status: DC

## 2022-03-20 MED ORDER — LACTULOSE 10 GM/15ML PO SOLN
20.0000 g | Freq: Every day | ORAL | Status: DC
  Filled 2022-03-20 (×10): qty 30

## 2022-03-20 MED ORDER — INSULIN ASPART 100 UNIT/ML IJ SOLN
0.0000 [IU] | Freq: Three times a day (TID) | INTRAMUSCULAR | Status: DC
  Administered 2022-03-21: 1 [IU] via SUBCUTANEOUS
  Filled 2022-03-20: qty 1

## 2022-03-20 MED ORDER — ATORVASTATIN CALCIUM 10 MG PO TABS
10.0000 mg | ORAL_TABLET | Freq: Every day | ORAL | Status: DC
  Administered 2022-03-21 – 2022-03-28 (×8): 10 mg via ORAL
  Filled 2022-03-20 (×9): qty 1

## 2022-03-20 MED ORDER — FUROSEMIDE 10 MG/ML IJ SOLN
20.0000 mg | Freq: Once | INTRAMUSCULAR | Status: AC
  Administered 2022-03-21: 20 mg via INTRAVENOUS
  Filled 2022-03-20: qty 2

## 2022-03-20 MED ORDER — VITAMIN B-12 1000 MCG PO TABS
1000.0000 ug | ORAL_TABLET | Freq: Every day | ORAL | Status: DC
  Administered 2022-03-21 – 2022-03-28 (×8): 1000 ug via ORAL
  Filled 2022-03-20 (×8): qty 1

## 2022-03-20 MED ORDER — ONDANSETRON HCL 4 MG PO TABS: 4.0000 mg | ORAL_TABLET | Freq: Four times a day (QID) | ORAL | Status: DC | PRN

## 2022-03-20 MED ORDER — ADULT MULTIVITAMIN W/MINERALS CH
1.0000 | ORAL_TABLET | Freq: Every day | ORAL | Status: DC
  Administered 2022-03-21 – 2022-03-28 (×8): 1 via ORAL
  Filled 2022-03-20 (×13): qty 1

## 2022-03-20 MED ORDER — WITCH HAZEL-GLYCERIN EX PADS
MEDICATED_PAD | CUTANEOUS | Status: DC | PRN
  Filled 2022-03-20: qty 100

## 2022-03-20 MED ORDER — MELATONIN 3 MG PO TABS
9.0000 mg | ORAL_TABLET | Freq: Every evening | ORAL | Status: DC | PRN
  Administered 2022-03-24 – 2022-03-27 (×4): 9 mg via ORAL
  Filled 2022-03-20 (×2): qty 3
  Filled 2022-03-20: qty 9
  Filled 2022-03-20 (×2): qty 3

## 2022-03-20 MED ORDER — ONDANSETRON HCL 4 MG/2ML IJ SOLN: 4.0000 mg | Freq: Four times a day (QID) | INTRAMUSCULAR | Status: DC | PRN

## 2022-03-20 MED ORDER — VITAMIN D 25 MCG (1000 UNIT) PO TABS
2000.0000 [IU] | ORAL_TABLET | Freq: Every day | ORAL | Status: DC
  Administered 2022-03-21 – 2022-03-28 (×8): 2000 [IU] via ORAL
  Filled 2022-03-20 (×9): qty 2

## 2022-03-20 MED ORDER — ACETAMINOPHEN 325 MG PO TABS: 650.0000 mg | ORAL_TABLET | Freq: Four times a day (QID) | ORAL | Status: DC | PRN

## 2022-03-20 MED ORDER — ALBUTEROL SULFATE (2.5 MG/3ML) 0.083% IN NEBU: 2.5000 mg | INHALATION_SOLUTION | RESPIRATORY_TRACT | Status: DC | PRN

## 2022-03-20 MED ORDER — ACETAMINOPHEN 650 MG RE SUPP: 650.0000 mg | Freq: Four times a day (QID) | RECTAL | Status: DC | PRN

## 2022-03-20 MED ORDER — SODIUM CHLORIDE 0.9% IV SOLUTION: Freq: Once | INTRAVENOUS | Status: DC

## 2022-03-20 MED ORDER — SERTRALINE HCL 50 MG PO TABS
50.0000 mg | ORAL_TABLET | Freq: Every day | ORAL | Status: DC
  Administered 2022-03-21 – 2022-03-28 (×8): 50 mg via ORAL
  Filled 2022-03-20 (×8): qty 1

## 2022-03-20 NOTE — ED Provider Notes (Signed)
Consulted by GI medicine here the recommending admission at [facility].  Hospitalist has discussed with interventional radiology at [facility].  Plan is hospitalist admission to [facility] from here.  Patient has started her blood transfusions for hemoglobin of 4.  Patient with a complicated history of cirrhosis nonalcoholic may be bleeding from rectal varices or either from rectal hemorrhoids.  Currently stable does not need an intervention at this point in time.  Hospitalist will admit to [facility].   Fredia Sorrow, MD 03/20/22 [phone]

## 2022-03-20 NOTE — ED Provider Notes (Signed)
[facility] Provider Note  CSN: [id number] Arrival date & time: 03/20/22 1301  Chief Complaint(s) Abnormal Lab (Low hgb 3.8)  HPI Rachel Huber is a 66 y.o. female with PMH cirrhosis status post TIPS currently being worked up for liver transplant by [facility], associated portal gastropathy and hemorrhoids status post recent embolization at [facility], thalassemia who presents emergency department for evaluation of medic anemia.  Patient presents from hematology clinic after receiving routine labs finding her to have a hemoglobin of 3.8.  Patient arrives pale but states that she feels that her GI bleeding has actually improved and is not occurring at the frequency it was prior to embolization procedure.  She states she does feel mildly fatigued but otherwise feels well.  Denies chest pain, shortness of breath, headache, fever or other symptoms.   Past Medical History Past Medical History:  Diagnosis Date   Anxiety    Arthritis    Cirrhosis of liver ([place])    Diabetes mellitus without complication (HCC)    Dyspnea    DOE   GERD (gastroesophageal reflux disease)    Hepatitis    PAST   Hypertension    Neuropathy    Neuropathy, diabetic ([place])    Pneumonia    Sinus complaint    Sleep apnea    CPAP   Thalassemia minor 1992   Patient Active Problem List   Diagnosis Date Noted   Acute blood loss anemia 03/20/2022   Acute on chronic blood loss anemia 03/05/2022   Thrombocytopenia ([place]) 03/05/2022   Fever 02/08/2022   Pancytopenia ([place]) 02/07/2022   GI bleeding 02/04/2022   Chronic kidney disease, stage 3b ([place]) 02/04/2022   Depression with anxiety 02/04/2022   Liver cirrhosis secondary to NASH ([place])    Acute GI hemorrhage 01/28/2022   AKI (acute kidney injury) ([place]) 01/27/2022   Insomnia 01/27/2022   Iron deficiency anemia due to chronic blood loss 02/13/2021   History of uterine cancer 02/13/2021   Decompensated hepatic cirrhosis ([place]) 10/25/2020   OSA on CPAP  10/25/2020   Glossitis 10/25/2020   Hyponatremia 10/08/2020   Rectal bleeding 10/08/2020   Acute respiratory failure with hypoxia ([place]) 09/10/2020   Pneumonia due to COVID-19 virus 09/08/2020   Hip fracture, right ([place]) 09/04/2020   Closed right hip fracture ([place]) 09/02/2020   Type 2 diabetes mellitus with hyperlipidemia ([place]) 09/02/2020   Thalassemia minor    HLD (hyperlipidemia)    Acute kidney injury superimposed on CKD ([place])    Depression    Fall at home, initial encounter    Nondisplaced fracture of greater trochanter of right femur, initial encounter for closed fracture ([place])    Home Medication(s) Prior to Admission medications   Medication Sig Start Date End Date Taking? Authorizing Provider  acetaminophen (TYLENOL) 650 MG CR tablet Take 1,300 mg by mouth daily as needed for pain.   Yes [provider]  albuterol (VENTOLIN HFA) 108 (90 Base) MCG/ACT inhaler Inhale 2 puffs into the lungs every 6 (six) hours as needed for wheezing or shortness of breath. 02/10/22  Yes Wieting, Richard, MD  atorvastatin (LIPITOR) 10 MG tablet Take 10 mg by mouth daily. 01/28/21  Yes [provider]  Cholecalciferol 50 MCG (2000 UT) CAPS Take 2,000 Units by mouth daily.   Yes [provider]  diphenhydrAMINE (BENADRYL) 25 MG tablet Take 25 mg by mouth 2 (two) times daily as needed for itching.   Yes [provider]  gabapentin (NEURONTIN) 400 MG capsule Take  800 mg by mouth 2 (two) times daily. 01/07/18  Yes [provider]  hydrocortisone (ANUSOL-HC) 2.5 % rectal cream Place rectally 4 (four) times daily. 03/07/22  Yes Wieting, Richard, MD  lactulose, encephalopathy, (CHRONULAC) 10 GM/15ML SOLN Take 30 mLs (20 g total) by mouth daily. Patient taking differently: Take 15 g by mouth daily. 01/30/22  Yes Emokpae, Courage, MD  melatonin 5 MG TABS Take 10 mg by mouth at bedtime as needed (sleep).   Yes [provider]  Multiple Vitamins-Minerals  (MULTIVITAMIN WITH MINERALS) tablet Take 1 tablet by mouth daily.   Yes [provider]  sertraline (ZOLOFT) 50 MG tablet Take 50 mg by mouth daily. 12/12/17  Yes [provider]  sodium chloride (OCEAN) 0.65 % SOLN nasal spray Place 1 spray into both nostrils as needed for congestion. Patient taking differently: Place 1 spray into both nostrils at bedtime. 02/10/22  Yes Wieting, Richard, MD  vitamin B-12 (CYANOCOBALAMIN) 1000 MCG tablet Take 1,000 mcg by mouth daily.   Yes [provider]  witch hazel-glycerin (TUCKS) pad Apply topically as needed for hemorrhoids. 03/07/22  Yes Loletha Grayer, MD                                                                                                                                    Past Surgical History Past Surgical History:  Procedure Laterality Date   ABDOMINAL HYSTERECTOMY     BREAST BIOPSY Right 02/15/2018   [place] bx 6-6:30 ribbon shape, ONE CORE FRAGMENT WITH FIBROSIS. ONE CORE FRAGMENT WITH PORTION OF A DILATED   BREAST BIOPSY Right 02/15/2018   [place] bx 9:00 heart shape, USUAL DUCTAL HYPERPLASIA   BREAST LUMPECTOMY Right 03/09/2018   Procedure: BREAST LUMPECTOMY x 2;  Surgeon: Benjamine Sprague, DO;  Location: [facility] ORS;  Service: General;  Laterality: Right;   CATARACT EXTRACTION W/PHACO Left 06/11/2016   Procedure: CATARACT EXTRACTION PHACO AND INTRAOCULAR LENS PLACEMENT (IOC);  Surgeon: Estill Cotta, MD;  Location: [facility] ORS;  Service: Ophthalmology;  Laterality: Left;  Lot # [id number] H US:01:38.6 AP%:26.4 CDE:44.15   CATARACT EXTRACTION W/PHACO Right 06/15/2018   Procedure: CATARACT EXTRACTION PHACO AND INTRAOCULAR LENS PLACEMENT (IOC);  Surgeon: Birder Robson, MD;  Location: [facility] ORS;  Service: Ophthalmology;  Laterality: Right;  [place] 00:38.2 CDE 4.23 Fluid Pack Lot # [id number]   COLONOSCOPIES     COLONOSCOPY WITH PROPOFOL N/A 10/10/2020   Procedure: COLONOSCOPY WITH PROPOFOL;  Surgeon: Lesly Rubenstein, MD;   Location: [facility] ENDOSCOPY;  Service: Endoscopy;  Laterality: N/A;   COLONOSCOPY WITH PROPOFOL N/A 11/20/2020   Procedure: COLONOSCOPY WITH PROPOFOL;  Surgeon: Lesly Rubenstein, MD;  Location: [facility] ENDOSCOPY;  Service: Endoscopy;  Laterality: N/A;  DM STAT CBC, BMP COVID POSITIVE 09/02/2020   CTR     ESOPHAGOGASTRODUODENOSCOPY (EGD) WITH PROPOFOL N/A 10/10/2020   Procedure: ESOPHAGOGASTRODUODENOSCOPY (EGD) WITH PROPOFOL;  Surgeon: Lesly Rubenstein, MD;  Location: [facility] ENDOSCOPY;  Service: Endoscopy;  Laterality: N/A;  COVID POSITIVE 10/08/2020   FLEXIBLE SIGMOIDOSCOPY N/A 02/06/2022   Procedure: FLEXIBLE SIGMOIDOSCOPY;  Surgeon: Lesly Rubenstein, MD;  Location: [facility] ENDOSCOPY;  Service: Endoscopy;  Laterality: N/A;  Patient requests anesthesia   IR ANGIOGRAM SELECTIVE EACH ADDITIONAL VESSEL  02/07/2022   IR ANGIOGRAM SELECTIVE EACH ADDITIONAL VESSEL  02/07/2022   IR ANGIOGRAM VISCERAL SELECTIVE  02/07/2022   IR EMBO ARTERIAL NOT HEMORR HEMANG INC GUIDE ROADMAPPING  02/07/2022   IR PARACENTESIS  12/17/2021   IR RADIOLOGIST EVAL & MGMT  03/18/2022   IR US GUIDE VASC ACCESS RIGHT  02/07/2022   JOINT REPLACEMENT     KNEE SURGERY Right 09/24/2017   plates and pins   TOTAL SHOULDER ARTHROPLASTY Right 09/27/2015   Family History Family History  Problem Relation Age of Onset   Breast cancer Mother 31   Lymphoma Mother    Diabetes Father    Kidney cancer Father    Heart disease Father    Diabetes Sister    Breast cancer Sister 50    Social History Social History   Tobacco Use   Smoking status: Never   Smokeless tobacco: Never  Vaping Use   Vaping Use: Never used  Substance Use Topics   Alcohol use: No   Drug use: Never   Allergies Gramineae pollens and Latex  Review of Systems Review of Systems  Gastrointestinal:  Positive for blood in stool.  Skin:  Positive for pallor.    Physical Exam Vital Signs  I have reviewed the triage vital signs BP (!) 101/52   Pulse 71   Temp  98.1 F (36.7 C) (Oral)   Resp 14   Ht 5' 9"  (1.753 m)   Wt 114.8 kg   SpO2 100%   BMI 37.36 kg/m   Physical Exam Vitals and nursing note reviewed.  Constitutional:      General: She is not in acute distress.    Appearance: She is well-developed.  HENT:     Head: Normocephalic and atraumatic.  Eyes:     Conjunctiva/sclera: Conjunctivae normal.  Cardiovascular:     Rate and Rhythm: Normal rate and regular rhythm.     Heart sounds: No murmur heard. Pulmonary:     Effort: Pulmonary effort is normal. No respiratory distress.     Breath sounds: Normal breath sounds.  Abdominal:     Palpations: Abdomen is soft.     Tenderness: There is no abdominal tenderness.  Musculoskeletal:        General: No swelling.     Cervical back: Neck supple.  Skin:    General: Skin is warm and dry.     Capillary Refill: Capillary refill takes less than 2 seconds.     Coloration: Skin is pale.  Neurological:     Mental Status: She is alert.  Psychiatric:        Mood and Affect: Mood normal.     ED Results and Treatments Labs (all labs ordered are listed, but only abnormal results are displayed) Labs Reviewed  COMPREHENSIVE METABOLIC PANEL - Abnormal; Notable for the following components:      Result Value   CO2 21 (*)    Glucose, Bld 200 (*)    BUN 39 (*)    Creatinine, Ser 2.19 (*)    Calcium 8.3 (*)    Total Protein 4.5 (*)    Albumin 2.4 (*)    AST 45 (*)    Total Bilirubin 2.1 (*)    GFR, Estimated 25 (*)  All other components within normal limits  CBC WITH DIFFERENTIAL/PLATELET - Abnormal; Notable for the following components:   RBC 1.71 (*)    Hemoglobin 4.0 (*)    HCT 13.4 (*)    MCV 78.4 (*)    MCH 23.4 (*)    MCHC 29.9 (*)    RDW 22.1 (*)    nRBC 0.5 (*)    Abs Immature Granulocytes 0.10 (*)    All other components within normal limits  CBC  BASIC METABOLIC PANEL  CBC  TYPE AND SCREEN  PREPARE RBC (CROSSMATCH)                                                                                                                           Radiology No results found.  Pertinent labs & imaging results that were available during my care of the patient were reviewed by me and considered in my medical decision making (see MDM for details).  Medications Ordered in ED Medications  0.9 %  sodium chloride infusion (Manually program via Guardrails IV Fluids) (0 mLs Intravenous Hold 03/20/22 1333)  atorvastatin (LIPITOR) tablet 10 mg (has no administration in time range)  sertraline (ZOLOFT) tablet 50 mg (has no administration in time range)  lactulose (CHRONULAC) 10 GM/15ML solution 20 g (20 g Oral Not Given 03/20/22 2109)  cyanocobalamin (VITAMIN B12) tablet 1,000 mcg (has no administration in time range)  melatonin tablet 9 mg (has no administration in time range)  cholecalciferol (VITAMIN D3) 25 MCG (1000 UNIT) tablet 2,000 Units (has no administration in time range)  multivitamin with minerals tablet 1 tablet (has no administration in time range)  hydrocortisone (ANUSOL-HC) 2.5 % rectal cream (has no administration in time range)  witch hazel-glycerin (TUCKS) pad (has no administration in time range)  acetaminophen (TYLENOL) tablet 650 mg (has no administration in time range)    Or  acetaminophen (TYLENOL) suppository 650 mg (has no administration in time range)  albuterol (PROVENTIL) (2.5 MG/3ML) 0.083% nebulizer solution 2.5 mg (has no administration in time range)  ondansetron (ZOFRAN) tablet 4 mg (has no administration in time range)    Or  ondansetron (ZOFRAN) injection 4 mg (has no administration in time range)  insulin aspart (novoLOG) injection 0-9 Units (has no administration in time range)  insulin aspart (novoLOG) injection 0-5 Units (has no administration in time range)  Procedures .Critical Care  Performed by: Teressa Lower, MD Authorized by: Teressa Lower, MD   Critical care provider statement:    Critical care time (minutes):  30   Critical care was necessary to treat or prevent imminent or life-threatening deterioration of the following conditions: Critical anemia requiring blood transfusion.   Critical care was time spent personally by me on the following activities:  Development of treatment plan with patient or surrogate, discussions with consultants, evaluation of patient's response to treatment, examination of patient, ordering and review of laboratory studies, ordering and review of radiographic studies, ordering and performing treatments and interventions, pulse oximetry, re-evaluation of patient's condition and review of old charts   (including critical care time)  Medical Decision Making / ED Course   This patient presents to the ED for concern of anemia, this involves an extensive number of treatment options, and is a complaint that carries with it a high risk of complications and morbidity.  The differential diagnosis includes acute blood loss anemia, thalassemia, bone marrow suppression, functional anticoagulation from liver disease  MDM: Patient seen the emergency room for evaluation of symptomatic anemia.  Physical exam reveals a pale appearing patient but is otherwise unremarkable.  No active GI bleeding in the emergency department.  Laboratory evaluation with a hemoglobin of 4.0 with an MCV of 78.4.  Chemistry with a BUN of 39, creatinine 2.19 which is an elevation for this patient likely secondary to her critical anemia.  4 units packed red blood cells were ordered for the patient.  After speaking with the hospitalist initially, the hospitalist raises appropriate concerns about possible need for embolization if bleeding continues and the appropriateness of admitting this patient to [facility].  Thus, I spoke with Dr. Abbey Chatters of GI who is agreed to come and evaluate the patient at bedside.   At time of signout, GI recommendations are currently pending.  Patient will require hospital admission but where this will be is currently in question.  Please see provider signout for continuation of work-up.   Additional history obtained: -Additional history obtained from husband -External records from outside source obtained and reviewed including: Chart review including previous notes, labs, imaging, consultation notes   Lab Tests: -I ordered, reviewed, and interpreted labs.   The pertinent results include:   Labs Reviewed  COMPREHENSIVE METABOLIC PANEL - Abnormal; Notable for the following components:      Result Value   CO2 21 (*)    Glucose, Bld 200 (*)    BUN 39 (*)    Creatinine, Ser 2.19 (*)    Calcium 8.3 (*)    Total Protein 4.5 (*)    Albumin 2.4 (*)    AST 45 (*)    Total Bilirubin 2.1 (*)    GFR, Estimated 25 (*)    All other components within normal limits  CBC WITH DIFFERENTIAL/PLATELET - Abnormal; Notable for the following components:   RBC 1.71 (*)    Hemoglobin 4.0 (*)    HCT 13.4 (*)    MCV 78.4 (*)    MCH 23.4 (*)    MCHC 29.9 (*)    RDW 22.1 (*)    nRBC 0.5 (*)    Abs Immature Granulocytes 0.10 (*)    All other components within normal limits  CBC  BASIC METABOLIC PANEL  CBC  TYPE AND SCREEN  PREPARE RBC (CROSSMATCH)     Medicines ordered and prescription drug management: Meds ordered this encounter  Medications   0.9 %  sodium  chloride infusion (Manually program via Guardrails IV Fluids)   atorvastatin (LIPITOR) tablet 10 mg   sertraline (ZOLOFT) tablet 50 mg   lactulose (CHRONULAC) 10 GM/15ML solution 20 g   cyanocobalamin (VITAMIN B12) tablet 1,000 mcg   melatonin tablet 9 mg   cholecalciferol (VITAMIN D3) 25 MCG (1000 UNIT) tablet 2,000 Units   multivitamin with minerals tablet 1 tablet   DISCONTD: albuterol (VENTOLIN HFA) 108 (90 Base) MCG/ACT inhaler 2 puff   hydrocortisone (ANUSOL-HC) 2.5 % rectal cream   witch hazel-glycerin  (TUCKS) pad   OR Linked Order Group    acetaminophen (TYLENOL) tablet 650 mg    acetaminophen (TYLENOL) suppository 650 mg   albuterol (PROVENTIL) (2.5 MG/3ML) 0.083% nebulizer solution 2.5 mg   OR Linked Order Group    ondansetron (ZOFRAN) tablet 4 mg    ondansetron (ZOFRAN) injection 4 mg   insulin aspart (novoLOG) injection 0-9 Units    Order Specific Question:   Correction coverage:    Answer:   Sensitive (thin, NPO, renal)    Order Specific Question:   CBG < 70:    Answer:   Implement Hypoglycemia Standing Orders and refer to Hypoglycemia Standing Orders sidebar report    Order Specific Question:   CBG 70 - 120:    Answer:   0 units    Order Specific Question:   CBG 121 - 150:    Answer:   1 unit    Order Specific Question:   CBG 151 - 200:    Answer:   2 units    Order Specific Question:   CBG 201 - 250:    Answer:   3 units    Order Specific Question:   CBG 251 - 300:    Answer:   5 units    Order Specific Question:   CBG 301 - 350:    Answer:   7 units    Order Specific Question:   CBG 351 - 400    Answer:   9 units    Order Specific Question:   CBG > 400    Answer:   call MD and obtain STAT lab verification   insulin aspart (novoLOG) injection 0-5 Units    Order Specific Question:   Correction coverage:    Answer:   HS scale    Order Specific Question:   CBG < 70:    Answer:   Implement Hypoglycemia Standing Orders and refer to Hypoglycemia Standing Orders sidebar report    Order Specific Question:   CBG 70 - 120:    Answer:   0 units    Order Specific Question:   CBG 121 - 150:    Answer:   0 units    Order Specific Question:   CBG 151 - 200:    Answer:   0 units    Order Specific Question:   CBG 201 - 250:    Answer:   2 units    Order Specific Question:   CBG 251 - 300:    Answer:   3 units    Order Specific Question:   CBG 301 - 350:    Answer:   4 units    Order Specific Question:   CBG 351 - 400:    Answer:   5 units    Order Specific Question:   CBG >  400    Answer:   call MD and obtain STAT lab verification    -I have reviewed the patients home medicines and  have made adjustments as needed  Critical interventions Packed red blood cell administration  Consultations Obtained: I requested consultation with the gastroenterologist Dr. Abbey Chatters,  and discussed lab and imaging findings as well as pertinent plan - they recommend: Recommendations pending   Cardiac Monitoring: The patient was maintained on a cardiac monitor.  I personally viewed and interpreted the cardiac monitored which showed an underlying rhythm of: NSR  Social Determinants of Health:  Factors impacting patients care include: none   Reevaluation: After the interventions noted above, I reevaluated the patient and found that they have :improved  Co morbidities that complicate the patient evaluation  Past Medical History:  Diagnosis Date   Anxiety    Arthritis    Cirrhosis of liver ([place])    Diabetes mellitus without complication ([place])    Dyspnea    DOE   GERD (gastroesophageal reflux disease)    Hepatitis    PAST   Hypertension    Neuropathy    Neuropathy, diabetic ([place])    Pneumonia    Sinus complaint    Sleep apnea    CPAP   Thalassemia minor 1992      Dispostion: I considered admission for this patient, and given critical anemia requiring blood transfusion patient will require hospital admission.     Final Clinical Impression(s) / ED Diagnoses Final diagnoses:  Symptomatic anemia     @PCDICTATION @    Teressa Lower, MD 03/20/22 2126

## 2022-03-20 NOTE — H&P (Signed)
TRH H&P   Patient Demographics:    Rachel Huber, is a 66 y.o. female  MRN: [id number]   DOB - 10/23/1955  Admit Date - 03/20/2022  Outpatient Primary MD for the patient is [name], MD  Referring MD/NP/PA: Dr Tinnie Gens  Outpatient Specialists: GI at [place]    Patient coming from: Home  Chief Complaint  Patient presents with   Abnormal Lab    Low hgb 3.8      HPI:    Rachel Huber  is a 66 y.o. female,  with past medical history significant for NASH cirrhosis with portal hypertension, status post TIPS May 2023 at [facility], HTN, DM type II, depression, thalassemia minor, OSA on CPAP and history of bleeding hemorrhoids requiring transfusion s/p  particle and coil embolization of rectal artery 02/07/2022 with Dr. Serafina Royals, she is following at [facility] , status post TIPS revision at [facility] with TIPS diameter increased from 8 to 10 mm, she is getting weekly paracentesis. -Patient was sent by hematology clinic as routine labs were done showing hemoglobin of 3.9, in ED repeat labs confirming hemoglobin of 3.9, patient reports dyspnea, fatigue, generalized weakness, she reports she is laying on the couch all day due to fatigue as well, she reports intermittent rectal bleed due to hemorrhoids, reported it did improve initially after her pollicization but has been intermittent and recurrent at a lower rate over the last few days, she had thoracentesis done yesterday by IR, which is done routinely once a week at [facility]. -In ED patient hemoglobin was 3.9, she was ordered 4 units PRBC, creatinine up from 1.5-2.19, she was seen by GI at [facility] Dr. Jenetta Downer at bedside who recommended IR input, I have discussed with Dr. Serafina Royals, who did recommend admission to [facility] for IR evaluation, and to get GI involved when she gets to [facility], she was ordered 4 units PRBC by ED  physician, [facility] consulted to admit.    Review of systems:      A full 10 point Review of Systems was done, except as stated above, all other Review of Systems were negative.   With Past History of the following :    Past Medical History:  Diagnosis Date   Anxiety    Arthritis    Cirrhosis of liver ([place])    Diabetes mellitus without complication ([place])    Dyspnea    DOE   GERD (gastroesophageal reflux disease)    Hepatitis    PAST   Hypertension    Neuropathy    Neuropathy, diabetic ([place])    Pneumonia    Sinus complaint    Sleep apnea    CPAP   Thalassemia minor 1992      Past Surgical History:  Procedure Laterality Date   ABDOMINAL HYSTERECTOMY     BREAST BIOPSY Right 02/15/2018   [place] bx 6-6:30 ribbon shape,  ONE CORE FRAGMENT WITH FIBROSIS. ONE CORE FRAGMENT WITH PORTION OF A DILATED   BREAST BIOPSY Right 02/15/2018   [place] bx 9:00 heart shape, USUAL DUCTAL HYPERPLASIA   BREAST LUMPECTOMY Right 03/09/2018   Procedure: BREAST LUMPECTOMY x 2;  Surgeon: Benjamine Sprague, DO;  Location: [facility] ORS;  Service: General;  Laterality: Right;   CATARACT EXTRACTION W/PHACO Left 06/11/2016   Procedure: CATARACT EXTRACTION PHACO AND INTRAOCULAR LENS PLACEMENT (IOC);  Surgeon: Estill Cotta, MD;  Location: [facility] ORS;  Service: Ophthalmology;  Laterality: Left;  Lot # [id number] H US:01:38.6 AP%:26.4 CDE:44.15   CATARACT EXTRACTION W/PHACO Right 06/15/2018   Procedure: CATARACT EXTRACTION PHACO AND INTRAOCULAR LENS PLACEMENT (IOC);  Surgeon: Birder Robson, MD;  Location: [facility] ORS;  Service: Ophthalmology;  Laterality: Right;  [place] 00:38.2 CDE 4.23 Fluid Pack Lot # [id number]   COLONOSCOPIES     COLONOSCOPY WITH PROPOFOL N/A 10/10/2020   Procedure: COLONOSCOPY WITH PROPOFOL;  Surgeon: Lesly Rubenstein, MD;  Location: [facility] ENDOSCOPY;  Service: Endoscopy;  Laterality: N/A;   COLONOSCOPY WITH PROPOFOL N/A 11/20/2020   Procedure: COLONOSCOPY WITH PROPOFOL;  Surgeon: Lesly Rubenstein, MD;  Location: [facility] ENDOSCOPY;  Service: Endoscopy;  Laterality: N/A;  DM STAT CBC, BMP COVID POSITIVE 09/02/2020   CTR     ESOPHAGOGASTRODUODENOSCOPY (EGD) WITH PROPOFOL N/A 10/10/2020   Procedure: ESOPHAGOGASTRODUODENOSCOPY (EGD) WITH PROPOFOL;  Surgeon: Lesly Rubenstein, MD;  Location: [facility] ENDOSCOPY;  Service: Endoscopy;  Laterality: N/A;  COVID POSITIVE 10/08/2020   FLEXIBLE SIGMOIDOSCOPY N/A 02/06/2022   Procedure: FLEXIBLE SIGMOIDOSCOPY;  Surgeon: Lesly Rubenstein, MD;  Location: [facility] ENDOSCOPY;  Service: Endoscopy;  Laterality: N/A;  Patient requests anesthesia   IR ANGIOGRAM SELECTIVE EACH ADDITIONAL VESSEL  02/07/2022   IR ANGIOGRAM SELECTIVE EACH ADDITIONAL VESSEL  02/07/2022   IR ANGIOGRAM VISCERAL SELECTIVE  02/07/2022   IR EMBO ARTERIAL NOT HEMORR HEMANG INC GUIDE ROADMAPPING  02/07/2022   IR PARACENTESIS  12/17/2021   IR RADIOLOGIST EVAL & MGMT  03/18/2022   IR US GUIDE VASC ACCESS RIGHT  02/07/2022   JOINT REPLACEMENT     KNEE SURGERY Right 09/24/2017   plates and pins   TOTAL SHOULDER ARTHROPLASTY Right 09/27/2015      Social History:     Social History   Tobacco Use   Smoking status: Never   Smokeless tobacco: Never  Substance Use Topics   Alcohol use: No        Family History :     Family History  Problem Relation Age of Onset   Breast cancer Mother 23   Lymphoma Mother    Diabetes Father    Kidney cancer Father    Heart disease Father    Diabetes Sister    Breast cancer Sister 64     Home Medications:   Prior to Admission medications   Medication Sig Start Date End Date Taking? Authorizing Provider  acetaminophen (TYLENOL) 650 MG CR tablet Take 1,300 mg by mouth daily as needed for pain.   Yes [provider]  albuterol (VENTOLIN HFA) 108 (90 Base) MCG/ACT inhaler Inhale 2 puffs into the lungs every 6 (six) hours as needed for wheezing or shortness of breath. 02/10/22  Yes Wieting, Richard, MD  atorvastatin (LIPITOR) 10 MG tablet  Take 10 mg by mouth daily. 01/28/21  Yes [provider]  Cholecalciferol 50 MCG (2000 UT) CAPS Take 2,000 Units by mouth daily.   Yes [provider]  diphenhydrAMINE (BENADRYL) 25 MG tablet Take 25 mg by mouth 2 (two)  times daily as needed for itching.   Yes [provider]  gabapentin (NEURONTIN) 400 MG capsule Take 800 mg by mouth 2 (two) times daily. 01/07/18  Yes [provider]  hydrocortisone (ANUSOL-HC) 2.5 % rectal cream Place rectally 4 (four) times daily. 03/07/22  Yes Wieting, Richard, MD  lactulose, encephalopathy, (CHRONULAC) 10 GM/15ML SOLN Take 30 mLs (20 g total) by mouth daily. Patient taking differently: Take 15 g by mouth daily. 01/30/22  Yes Emokpae, Courage, MD  melatonin 5 MG TABS Take 10 mg by mouth at bedtime as needed (sleep).   Yes [provider]  Multiple Vitamins-Minerals (MULTIVITAMIN WITH MINERALS) tablet Take 1 tablet by mouth daily.   Yes [provider]  sertraline (ZOLOFT) 50 MG tablet Take 50 mg by mouth daily. 12/12/17  Yes [provider]  sodium chloride (OCEAN) 0.65 % SOLN nasal spray Place 1 spray into both nostrils as needed for congestion. Patient taking differently: Place 1 spray into both nostrils at bedtime. 02/10/22  Yes Wieting, Richard, MD  vitamin B-12 (CYANOCOBALAMIN) 1000 MCG tablet Take 1,000 mcg by mouth daily.   Yes [provider]  witch hazel-glycerin (TUCKS) pad Apply topically as needed for hemorrhoids. 03/07/22  Yes Loletha Grayer, MD     Allergies:     Allergies  Allergen Reactions   Gramineae Pollens Other (See Comments)    Sneezing, Running nose   Latex Rash    Contact rash     Physical Exam:   Vitals  Blood pressure 113/76, pulse 79, temperature 98.1 F (36.7 C), temperature source Oral, resp. rate 12, height 5' 9"  (1.753 m), weight 114.8 kg, SpO2 100 %.   1. General frail, chronically appearing female, laying in bed, in no apparent distress,  pale  2. Normal affect and insight, Not Suicidal or Homicidal, Awake Alert, Oriented X 3.  3. No F.N deficits, ALL C.Nerves Intact, Strength 5/5 all 4 extremities, Sensation intact all 4 extremities, Plantars down going.  4. Ears and Eyes appear Normal, Conjunctivae clear, PERRLA. Moist Oral Mucosa.  5. Supple Neck, No JVD, No cervical lymphadenopathy appriciated, No Carotid Bruits.  6. Symmetrical Chest wall movement, Good air movement bilaterally, CTAB.  7. RRR, No Gallops, Rubs or Murmurs, No Parasternal Heave.  +1 edema  8. Positive Bowel Sounds, Abdomen Soft, No tenderness, No organomegaly appriciated,No rebound -guarding or rigidity.  9.  No Cyanosis, Normal Skin Turgor, No Skin Rash or Bruise.  10. Good muscle tone,  joints appear normal , no effusions, Normal ROM.  11. No Palpable Lymph Nodes in Neck or Axillae     Data Review:    CBC Recent Labs  Lab 03/20/22 1153 03/20/22 1307  WBC 6.9 7.4  HGB 3.9* 4.0*  HCT 13.2* 13.4*  PLT 205 211  MCV 79.5* 78.4*  MCH 23.5* 23.4*  MCHC 29.5* 29.9*  RDW 22.2* 22.1*  LYMPHSABS  --  1.4  MONOABS  --  0.6  EOSABS  --  0.3  BASOSABS  --  0.0   ------------------------------------------------------------------------------------------------------------------  Chemistries  Recent Labs  Lab 03/20/22 1307  NA 135  K 4.4  CL 108  CO2 21*  GLUCOSE 200*  BUN 39*  CREATININE 2.19*  CALCIUM 8.3*  AST 45*  ALT 25  ALKPHOS 104  BILITOT 2.1*   ------------------------------------------------------------------------------------------------------------------ estimated creatinine clearance is 35.1 mL/min (A) (by C-G formula based on SCr of 2.19 mg/dL (H)). ------------------------------------------------------------------------------------------------------------------ No results for input(s): "TSH", "T4TOTAL", "T3FREE", "THYROIDAB" in the last 72 hours.  Invalid  input(s): "FREET3"  Coagulation profile No results for  input(s): "INR", "PROTIME" in the last 168 hours. ------------------------------------------------------------------------------------------------------------------- No results for input(s): "DDIMER" in the last 72 hours. -------------------------------------------------------------------------------------------------------------------  Cardiac Enzymes No results for input(s): "CKMB", "TROPONINI", "MYOGLOBIN" in the last 168 hours.  Invalid input(s): "CK" ------------------------------------------------------------------------------------------------------------------    Component Value Date/Time   BNP 25.0 10/25/2020 1547     ---------------------------------------------------------------------------------------------------------------  Urinalysis    Component Value Date/Time   COLORURINE YELLOW (A) 10/25/2020 2026   APPEARANCEUR HAZY (A) 10/25/2020 2026   LABSPEC 1.017 10/25/2020 2026   PHURINE 5.0 10/25/2020 2026   GLUCOSEU NEGATIVE 10/25/2020 2026   HGBUR SMALL (A) 10/25/2020 2026   BILIRUBINUR NEGATIVE 10/25/2020 2026   KETONESUR NEGATIVE 10/25/2020 2026   PROTEINUR NEGATIVE 10/25/2020 2026   NITRITE POSITIVE (A) 10/25/2020 2026   LEUKOCYTESUR SMALL (A) 10/25/2020 2026    ----------------------------------------------------------------------------------------------------------------   Imaging Results:    No results found.     Assessment & Plan:    Principal Problem:   Acute blood loss anemia Active Problems:   GI bleeding   Liver cirrhosis secondary to NASH (HCC)   HLD (hyperlipidemia)   Chronic kidney disease, stage 3b (HCC)   Thalassemia minor   Type 2 diabetes mellitus with hyperlipidemia (HCC)   Rectal bleeding   Decompensated hepatic cirrhosis (HCC)   AKI (acute kidney injury) ([place])   Acute on chronic blood loss anemia Rectal/hemorrhoidal bleed -With known history of [place] cirrhosis, with grade 4 hemorrhagic internal hemorrhoids status post  bilateral superior rectal artery embolization on 02/10/2022 at [facility]. -Patient now presents with recurrent intermittent low intensity lower GI bleed likely due to had hemorrhoidal bleed. -Hemoglobin of 3.9, she will receive 4 units PRBC, monitor CBC closely -Patient was seen and evaluated at bedside by GI at [facility] Dr. Jenetta Downer, main intervention needed at this point is IR evaluation to see if she is a candidate for recurrent embolization, have discussed with GI on-call Dr. Dorinda Hill well he is the IR physician who performed her previous rectal artery embolization), he does recommend her to be admitted to [facility] due to better IR staffing, as well recommend when her fattening back at baseline to obtain, CT abdomen and pelvis, and to consult GI. -Keep on clear liquid diet. -SCD  for DVT prophylaxis -Admit to [place] when patient get to [facility]. IR consult was placed in Epic.   Liver cirrhosis secondary to NASH  -Minimal ascites and lower extremity edema -Status post TIPS, and TIPS revision. -She is receiving weekly paracentesis, most recent 03/18/2021 at [place] with lactulose -Does not appear to be on any Lasix or Aldactone   AKI on CKD Chronic kidney disease, stage 3b (HCC) -Baseline creatinine 1.5, it is 2.2 on admission -This should improve with volume resuscitation as she is receiving blood   Thalassemia minor Followed by hematology.     Depression with anxiety Continue sertraline   Type 2 diabetes mellitus with hyperlipidemia ([place]) Most recent hemoglobin A1c low at 5.7.  On low-dose Lipitor.      DVT Prophylaxis SCDs   AM Labs Ordered, also please review Full Orders  Family Communication: Admission, patients condition and plan of care including tests being ordered have been discussed with the patient  who indicate understanding and agree with the plan and Code Status.  Code Status Full  Likely DC to  Home  Condition GUARDED     Consults called: D/W IR  Dr Serafina Royals  Admission status: inpatient  Time spent in minutes : 70 minutes   Phillips Climes M.D on 03/20/2022 at 5:20 PM   [facility] - Office  [phone]

## 2022-03-20 NOTE — Progress Notes (Deleted)
RESCHEDULED

## 2022-03-20 NOTE — Progress Notes (Unsigned)
Critical value alert  Received critial value alert:  Hgb 3.9 Received at 1234 Notified Tarri Abernethy, PA Orders received:  send patient to ER immediately.    Patient was no longer at the clinic.  I contacted the patient via telephone and advised her of lab results and the need to return to ER immediately.  She verbalized understanding.    Tarri Abernethy, PA spoke with Dr. Linus Salmons, attending in ER to advise of her return.

## 2022-03-20 NOTE — ED Notes (Signed)
Pt care taken, 2nd unit of blood is infusing, no other complaints.

## 2022-03-20 NOTE — ED Triage Notes (Signed)
Hgb 3.8 per lab test earlier today

## 2022-03-20 NOTE — Consult Note (Signed)
Gastroenterology Consult   Referring Provider: No ref. provider found Primary Care Physician:  [name], MD Primary Gastroenterologist:  Camille Bal at [facility] and followed by [facility]  Patient ID: [name]; [id number]; [birth year]   Admit date: 03/20/2022  LOS: 0 days   Date of Consultation: 03/20/2022  Reason for Consultation:  GI bleeding, anemia    History of Present Illness   Rachel Huber is a 66 y.o. 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  Patient completed labs by hematology this morning and hgb was 3.9, down from 7.6 one week ago. She was advised to go to the ED. Noted to have bump in BUN to 39 and Creatinine to 2.19.  She reports improved amount of rectal bleeding since coil embolization in 01/2022 however she still passes brbpr with every bowel movement, just doesn't "pour like a faucet" anymore. She denies melena. No constipation. Using lactulose as prescribed after her TIPs placement. No episodes of abdominal pain, vomiting, confusion. She continues to require weekly paracentesis but notes she is having less drawn each time. She is currently receiving her first unit of prbcs, this presentation. She denies chest pain, SOB, dizziness.     Prior to Admission medications   Medication Sig Start Date End Date Taking? Authorizing Provider  acetaminophen (TYLENOL) 650 MG CR tablet Take 1,300 mg by mouth daily as needed for pain.   Yes [provider]  albuterol (VENTOLIN HFA) 108 (90 Base) MCG/ACT inhaler Inhale 2 puffs into the lungs every 6 (six) hours as needed for wheezing or shortness of  breath. 02/10/22  Yes Wieting, Richard, MD  atorvastatin (LIPITOR) 10 MG tablet Take 10 mg by mouth daily. 01/28/21  Yes [provider]  Cholecalciferol 50 MCG (2000 UT) CAPS Take 2,000 Units by mouth daily.   Yes [provider]  diphenhydrAMINE (BENADRYL) 25 MG tablet Take 25 mg by mouth 2 (two) times daily as needed for itching.   Yes [provider]  gabapentin (NEURONTIN) 400 MG capsule Take 800 mg by mouth 2 (two) times daily. 01/07/18  Yes [provider]  hydrocortisone (ANUSOL-HC) 2.5 % rectal cream Place rectally 4 (four) times daily. 03/07/22  Yes Wieting, Richard, MD  lactulose, encephalopathy, (CHRONULAC) 10 GM/15ML SOLN Take 30 mLs (20 g total) by mouth daily. Patient taking differently: Take 15 g by mouth daily. 01/30/22  Yes Emokpae, Courage, MD  melatonin 5 MG TABS Take 10 mg by mouth at bedtime as needed (sleep).   Yes [provider]  Multiple Vitamins-Minerals (MULTIVITAMIN WITH MINERALS) tablet Take 1 tablet by mouth daily.   Yes [provider]  sertraline (ZOLOFT) 50 MG tablet Take 50 mg by mouth daily. 12/12/17  Yes [provider]  sodium chloride (OCEAN) 0.65 % SOLN nasal spray Place 1 spray into both nostrils as needed for congestion. Patient taking differently: Place 1 spray into both nostrils at bedtime. 02/10/22  Yes Wieting, Richard, MD  vitamin B-12 (CYANOCOBALAMIN) 1000 MCG tablet Take 1,000 mcg by mouth daily.   Yes [provider]  witch hazel-glycerin (TUCKS) pad Apply topically as needed for hemorrhoids. 03/07/22  Yes Loletha Grayer, MD    Current Facility-Administered Medications  Medication Dose Route Frequency Provider Last Rate Last Admin   0.9 %  sodium chloride infusion (Manually program via Guardrails IV Fluids)   Intravenous Once Elgergawy, Silver Huguenin, MD   Held at 03/20/22 1333   acetaminophen (TYLENOL) tablet 650 mg  650 mg Oral Q6H PRN Elgergawy, Silver Huguenin, MD       Or   acetaminophen  (TYLENOL) suppository 650 mg  650 mg Rectal Q6H PRN Elgergawy, Silver Huguenin, MD       albuterol (PROVENTIL) (2.5 MG/3ML) 0.083% nebulizer solution 2.5 mg  2.5 mg Nebulization Q2H PRN Elgergawy, Silver Huguenin, MD       [START ON 03/21/2022] atorvastatin (LIPITOR) tablet 10 mg  10 mg Oral Daily Elgergawy, Silver Huguenin, MD       [START ON 03/21/2022] cholecalciferol (VITAMIN D3) 25 MCG (1000 UNIT) tablet 2,000 Units  2,000 Units Oral Daily Elgergawy, Silver Huguenin, MD       [START ON 03/21/2022] cyanocobalamin (VITAMIN B12) tablet 1,000 mcg  1,000 mcg Oral Daily Elgergawy, Silver Huguenin, MD       [START ON 03/21/2022] hydrocortisone (ANUSOL-HC) 2.5 % rectal cream   Rectal QID Elgergawy, Silver Huguenin, MD       insulin aspart (novoLOG) injection 0-5 Units  0-5 Units Subcutaneous QHS Elgergawy, Silver Huguenin, MD       [START ON 03/21/2022] insulin aspart (novoLOG) injection 0-9 Units  0-9 Units Subcutaneous TID WC Elgergawy, Silver Huguenin, MD       lactulose (CHRONULAC) 10 GM/15ML solution 20 g  20 g Oral Daily Elgergawy, Silver Huguenin, MD       melatonin tablet 9 mg  9 mg Oral QHS PRN Elgergawy, Silver Huguenin, MD       [START ON 03/21/2022] multivitamin with minerals tablet 1 tablet  1 tablet Oral Daily Elgergawy, Silver Huguenin, MD       ondansetron (ZOFRAN) tablet 4 mg  4 mg Oral Q6H PRN Elgergawy, Silver Huguenin, MD       Or   ondansetron (ZOFRAN) injection 4 mg  4 mg Intravenous Q6H PRN Elgergawy, Silver Huguenin, MD       [START ON 03/21/2022] sertraline (ZOLOFT) tablet 50 mg  50 mg Oral Daily Elgergawy, Silver Huguenin, MD       witch hazel-glycerin (TUCKS) pad   Topical PRN Elgergawy, Silver Huguenin, MD       Current Outpatient Medications  Medication Sig Dispense Refill   acetaminophen (TYLENOL) 650 MG CR tablet Take 1,300 mg by mouth daily as needed for pain.     albuterol (VENTOLIN HFA) 108 (90 Base) MCG/ACT inhaler Inhale 2 puffs into the lungs every 6 (six) hours as needed for wheezing or shortness of breath. 18 g 0   atorvastatin (LIPITOR) 10 MG tablet Take 10 mg by mouth daily.      Cholecalciferol 50 MCG (2000 UT) CAPS Take 2,000 Units by mouth daily.     diphenhydrAMINE (BENADRYL) 25 MG tablet Take 25 mg by mouth 2 (two) times daily as needed for itching.     gabapentin (NEURONTIN) 400 MG capsule Take 800 mg by mouth 2 (two) times daily.     hydrocortisone (ANUSOL-HC) 2.5 % rectal cream Place rectally 4 (four) times daily. 30 g 0   lactulose, encephalopathy, (CHRONULAC) 10 GM/15ML SOLN Take 30 mLs (20 g total) by mouth daily. (Patient taking differently: Take 15 g by mouth daily.) 946 mL 1   melatonin 5 MG TABS Take 10 mg by mouth at bedtime as needed (sleep).  Multiple Vitamins-Minerals (MULTIVITAMIN WITH MINERALS) tablet Take 1 tablet by mouth daily.     sertraline (ZOLOFT) 50 MG tablet Take 50 mg by mouth daily.     sodium chloride (OCEAN) 0.65 % SOLN nasal spray Place 1 spray into both nostrils as needed for congestion. (Patient taking differently: Place 1 spray into both nostrils at bedtime.) 15 mL 0   vitamin B-12 (CYANOCOBALAMIN) 1000 MCG tablet Take 1,000 mcg by mouth daily.     witch hazel-glycerin (TUCKS) pad Apply topically as needed for hemorrhoids. 40 each 0    Allergies as of 03/20/2022 - Review Complete 03/20/2022  Allergen Reaction Noted   Gramineae pollens Other (See Comments) 10/23/2021   Latex Rash 09/02/2016    Past Medical History:  Diagnosis Date   Anxiety    Arthritis    Cirrhosis of liver ([place])    Diabetes mellitus without complication (HCC)    Dyspnea    DOE   GERD (gastroesophageal reflux disease)    Hepatitis    PAST   Hypertension    Neuropathy    Neuropathy, diabetic ([place])    Pneumonia    Sinus complaint    Sleep apnea    CPAP   Thalassemia minor 1992    Past Surgical History:  Procedure Laterality Date   ABDOMINAL HYSTERECTOMY     BREAST BIOPSY Right 02/15/2018   [place] bx 6-6:30 ribbon shape, ONE CORE FRAGMENT WITH FIBROSIS. ONE CORE FRAGMENT WITH PORTION OF A DILATED   BREAST BIOPSY Right 02/15/2018   [place] bx 9:00  heart shape, USUAL DUCTAL HYPERPLASIA   BREAST LUMPECTOMY Right 03/09/2018   Procedure: BREAST LUMPECTOMY x 2;  Surgeon: Benjamine Sprague, DO;  Location: [facility] ORS;  Service: General;  Laterality: Right;   CATARACT EXTRACTION W/PHACO Left 06/11/2016   Procedure: CATARACT EXTRACTION PHACO AND INTRAOCULAR LENS PLACEMENT (IOC);  Surgeon: Estill Cotta, MD;  Location: [facility] ORS;  Service: Ophthalmology;  Laterality: Left;  Lot # [id number] H US:01:38.6 AP%:26.4 CDE:44.15   CATARACT EXTRACTION W/PHACO Right 06/15/2018   Procedure: CATARACT EXTRACTION PHACO AND INTRAOCULAR LENS PLACEMENT (IOC);  Surgeon: Birder Robson, MD;  Location: [facility] ORS;  Service: Ophthalmology;  Laterality: Right;  [place] 00:38.2 CDE 4.23 Fluid Pack Lot # [id number]   COLONOSCOPIES     COLONOSCOPY WITH PROPOFOL N/A 10/10/2020   Procedure: COLONOSCOPY WITH PROPOFOL;  Surgeon: Lesly Rubenstein, MD;  Location: [facility] ENDOSCOPY;  Service: Endoscopy;  Laterality: N/A;   COLONOSCOPY WITH PROPOFOL N/A 11/20/2020   Procedure: COLONOSCOPY WITH PROPOFOL;  Surgeon: Lesly Rubenstein, MD;  Location: [facility] ENDOSCOPY;  Service: Endoscopy;  Laterality: N/A;  DM STAT CBC, BMP COVID POSITIVE 09/02/2020   CTR     ESOPHAGOGASTRODUODENOSCOPY (EGD) WITH PROPOFOL N/A 10/10/2020   Procedure: ESOPHAGOGASTRODUODENOSCOPY (EGD) WITH PROPOFOL;  Surgeon: Lesly Rubenstein, MD;  Location: [facility] ENDOSCOPY;  Service: Endoscopy;  Laterality: N/A;  COVID POSITIVE 10/08/2020   FLEXIBLE SIGMOIDOSCOPY N/A 02/06/2022   Procedure: FLEXIBLE SIGMOIDOSCOPY;  Surgeon: Lesly Rubenstein, MD;  Location: [facility] ENDOSCOPY;  Service: Endoscopy;  Laterality: N/A;  Patient requests anesthesia   IR ANGIOGRAM SELECTIVE EACH ADDITIONAL VESSEL  02/07/2022   IR ANGIOGRAM SELECTIVE EACH ADDITIONAL VESSEL  02/07/2022   IR ANGIOGRAM VISCERAL SELECTIVE  02/07/2022   IR EMBO ARTERIAL NOT HEMORR HEMANG INC GUIDE ROADMAPPING  02/07/2022   IR PARACENTESIS  12/17/2021   IR RADIOLOGIST EVAL & MGMT   03/18/2022   IR US GUIDE VASC ACCESS RIGHT  02/07/2022   JOINT REPLACEMENT     KNEE SURGERY  Right 09/24/2017   plates and pins   TOTAL SHOULDER ARTHROPLASTY Right 09/27/2015    Family History  Problem Relation Age of Onset   Breast cancer Mother 34   Lymphoma Mother    Diabetes Father    Kidney cancer Father    Heart disease Father    Diabetes Sister    Breast cancer Sister 19    Social History   Socioeconomic History   Marital status: Married    Spouse name: Not on file   Number of children: 0   Years of education: Not on file   Highest education level: Not on file  Occupational History   Occupation: EMPLOYED  Tobacco Use   Smoking status: Never   Smokeless tobacco: Never  Vaping Use   Vaping Use: Never used  Substance and Sexual Activity   Alcohol use: No   Drug use: Never   Sexual activity: Not Currently  Other Topics Concern   Not on file  Social History Narrative   Not on file   Social Determinants of Health   Financial Resource Strain: Low Risk  (01/31/2021)   Overall Financial Resource Strain (CARDIA)    Difficulty of Paying Living Expenses: Not hard at all  Food Insecurity: No Food Insecurity (01/31/2021)   Hunger Vital Sign    Worried About Running Out of Food in the Last Year: Never true    [place] in the Last Year: Never true  Transportation Needs: No Transportation Needs (01/31/2021)   PRAPARE - Hydrologist (Medical): No    Lack of Transportation (Non-Medical): No  Physical Activity: Inactive (01/31/2021)   Exercise Vital Sign    Days of Exercise per Week: 0 days    Minutes of Exercise per Session: 0 min  Stress: No Stress Concern Present (01/31/2021)   [place]    Feeling of Stress : Not at all  Social Connections: Moderately Isolated (01/31/2021)   Social Connection and Isolation Panel [NHANES]    Frequency of Communication with Friends and  Family: More than three times a week    Frequency of Social Gatherings with Friends and Family: Once a week    Attends Religious Services: Never    Marine scientist or Organizations: No    Attends Archivist Meetings: Never    Marital Status: Married  Human resources officer Violence: Not At Risk (01/31/2021)   Humiliation, Afraid, Rape, and Kick questionnaire    Fear of Current or Ex-Partner: No    Emotionally Abused: No    Physically Abused: No    Sexually Abused: No     Review of System:   General: Negative for anorexia, weight loss, fever, chills,+ fatigue, weakness. Eyes: Negative for vision changes.  ENT: Negative for hoarseness, difficulty swallowing , nasal congestion. CV: Negative for chest pain, angina, palpitations, dyspnea on exertion, peripheral edema.  Respiratory: Negative for dyspnea at rest, dyspnea on exertion, cough, sputum, wheezing.  GI: See history of present illness. GU:  Negative for dysuria, hematuria, urinary incontinence, urinary frequency, nocturnal urination.  MS: Negative for joint pain, low back pain.  Derm: Negative for rash or itching.  Neuro: Negative for weakness, abnormal sensation, seizure, frequent headaches, memory loss, confusion.  Psych: Negative for anxiety, depression, suicidal ideation, hallucinations.  Endo: Negative for unusual weight change.  Heme: Negative for bruising or bleeding. Allergy: Negative for rash or hives.      Physical Examination:  Vital signs in last 24 hours: Temp:  [97.9 F (36.6 C)-98.3 F (36.8 C)] 98.1 F (36.7 C) (08/03 2058) Pulse Rate:  [71-87] 71 (08/03 2058) Resp:  [11-23] 14 (08/03 2058) BP: (90-116)/(34-76) 101/52 (08/03 2058) SpO2:  [95 %-100 %] 100 % (08/03 1900) Weight:  [114.8 kg] 114.8 kg (08/03 1311)    General: chronically ill appearing. in no acute distress.  Head: Normocephalic, atraumatic.   Eyes: Conjunctiva pink, no icterus. Mouth: Oropharyngeal mucosa moist and pink , no  lesions erythema or exudate. Neck: Supple without thyromegaly, masses, or lymphadenopathy.  Lungs: Clear to auscultation bilaterally.  Heart: Regular rate and rhythm, no murmurs rubs or gallops.  Abdomen: Bowel sounds are normal, nontender, mildly distended, no hepatosplenomegaly or masses appreciated, no abdominal bruits or hernia , no rebound or guarding.   Rectal: not performed Extremities: No lower extremity edema, clubbing, deformity.  Neuro: Alert and oriented x 4 , grossly normal neurologically.  Skin: Warm and dry, no rash or jaundice.   Psych: Alert and cooperative, normal mood and affect.        Intake/Output from previous day: No intake/output data recorded. Intake/Output this shift: No intake/output data recorded.  Lab Results:   CBC Recent Labs    03/20/22 1153 03/20/22 1307  WBC 6.9 7.4  HGB 3.9* 4.0*  HCT 13.2* 13.4*  MCV 79.5* 78.4*  PLT 205 211   BMET Recent Labs    03/20/22 1307  NA 135  K 4.4  CL 108  CO2 21*  GLUCOSE 200*  BUN 39*  CREATININE 2.19*  CALCIUM 8.3*   LFT Recent Labs    03/20/22 1307  BILITOT 2.1*  ALKPHOS 104  AST 45*  ALT 25  PROT 4.5*  ALBUMIN 2.4*    Lipase No results for input(s): "LIPASE" in the last 72 hours.  PT/INR No results for input(s): "LABPROT", "INR" in the last 72 hours.   Hepatitis Panel No results for input(s): "HEPBSAG", "HCVAB", "HEPAIGM", "HEPBIGM" in the last 72 hours.   Radiology/Studies No results found.    Imaging Studies:   IR Radiologist Eval & Mgmt  Result Date: 03/18/2022 Please refer to notes tab for details about interventional procedure. (Op Note)  US Paracentesis  Result Date: 03/18/2022 INDICATION: History of NASH cirrhosis. Recent history of tips procedure at outside facility. Recurrent ascites. Request for therapeutic paracentesis. EXAM: ULTRASOUND GUIDED RIGHT LOWER QUADRANT PARACENTESIS MEDICATIONS: 1% plain lidocaine, 12 mL COMPLICATIONS: None immediate. PROCEDURE:  Informed written consent was obtained from the patient after a discussion of the risks, benefits and alternatives to treatment. A timeout was performed prior to the initiation of the procedure. Initial ultrasound scanning demonstrates a large amount of ascites within the right lower abdominal quadrant. The right lower abdomen was prepped and draped in the usual sterile fashion. 1% lidocaine was used for local anesthesia. Following this, a 19 gauge, 7-cm, Yueh catheter was introduced. An ultrasound image was saved for documentation purposes. The paracentesis was performed. The catheter was removed and a dressing was applied. The patient tolerated the procedure well without immediate post procedural complication. FINDINGS: A total of approximately 6.8 L of clear yellow fluid was removed. IMPRESSION: Successful ultrasound-guided paracentesis yielding 6.8 liters of peritoneal fluid. Read by: Ascencion Dike PA-C Electronically Signed   By: Aletta Edouard M.D.   On: 03/18/2022 13:18   US Paracentesis  Result Date: 03/11/2022 INDICATION: Patient with history of NASH cirrhosis and recurrent ascites status post tips procedure at [facility], maximum volume request of 8  L to be drawn off per ordering provider. EXAM: ULTRASOUND GUIDED  PARACENTESIS MEDICATIONS: Local 1% lidocaine only. COMPLICATIONS: None immediate. PROCEDURE: Informed written consent was obtained from the patient after a discussion of the risks, benefits and alternatives to treatment. A timeout was performed prior to the initiation of the procedure. Initial ultrasound scanning demonstrates a large amount of ascites within the right upper abdominal quadrant. The right upper abdomen was prepped and draped in the usual sterile fashion. 1% lidocaine was used for local anesthesia. Following this, a 19 gauge, 10-cm, Yueh catheter was introduced. An ultrasound image was saved for documentation purposes. The paracentesis was performed. The catheter was removed and a  dressing was applied. The patient tolerated the procedure well without immediate post procedural complication. Patient received post-procedure intravenous albumin; see nursing notes for details. FINDINGS: A total of approximately 7.2 L of clear yellow fluid was removed. IMPRESSION: Successful ultrasound-guided paracentesis yielding 7.2 liters of peritoneal fluid. PLAN: The patient has reportedly had a TIPS procedure at [facility] on 12/26/2021 and is awaiting liver transplant there, she is followed by the liver clinic at [facility]. She is also scheduled for a TIPS revision next week. This exam was performed by Tsosie Billing PA-C, and was supervised and interpreted by Dr. Kathlene Cote. Electronically Signed   By: Aletta Edouard M.D.   On: 03/11/2022 14:32   US Paracentesis  Result Date: 03/04/2022 INDICATION: Patient with history of NASH cirrhosis and recurrent ascites status post tips procedure at [facility], maximum volume request of 8 L to be drawn off per ordering provider. EXAM: ULTRASOUND GUIDED  PARACENTESIS MEDICATIONS: 1% lidocaine only. COMPLICATIONS: None immediate. PROCEDURE: Informed written consent was obtained from the patient after a discussion of the risks, benefits and alternatives to treatment. A timeout was performed prior to the initiation of the procedure. Initial ultrasound scanning demonstrates a large amount of ascites within the right upper abdominal quadrant. The right upper abdomen was prepped and draped in the usual sterile fashion. 1% lidocaine was used for local anesthesia. Following this, a 19 gauge, 10-cm, Yueh catheter was introduced. An ultrasound image was saved for documentation purposes. The paracentesis was performed. The catheter was removed and a dressing was applied. The patient tolerated the procedure well without immediate post procedural complication. Patient received post-procedure intravenous albumin; see nursing notes for details. FINDINGS: A total of approximately  7 Liters of clear yellow fluid was removed. IMPRESSION: Successful ultrasound-guided paracentesis yielding 7 liters of peritoneal fluid. PLAN: The patient has reportedly had a TIPS procedure at [facility] on 12/26/2021 and is awaiting liver transplant there, she is followed by the liver clinic at [facility]. This exam was performed by Tsosie Billing PA-C, and was supervised and interpreted by Dr. Annamaria Boots Electronically Signed   By: Jerilynn Mages.  Shick M.D.   On: 03/04/2022 12:35   US Paracentesis  Result Date: 02/25/2022 INDICATION: Patient with history of NASH cirrhosis and recurrent ascites status post tips procedure at [facility], maximum volume request of 8 L to be drawn off per ordering provider. EXAM: ULTRASOUND GUIDED  PARACENTESIS MEDICATIONS: Local 1% lidocaine only. COMPLICATIONS: None immediate. PROCEDURE: Informed written consent was obtained from the patient after a discussion of the risks, benefits and alternatives to treatment. A timeout was performed prior to the initiation of the procedure. Initial ultrasound scanning demonstrates a large amount of ascites within the right lower abdominal quadrant. The right lower abdomen was prepped and draped in the usual sterile fashion. 1% lidocaine was used for local anesthesia.  Following this, a 19 gauge, 10-cm, Yueh catheter was introduced. An ultrasound image was saved for documentation purposes. The paracentesis was performed. The catheter was removed and a dressing was applied. The patient tolerated the procedure well without immediate post procedural complication. Patient received post-procedure intravenous albumin; see nursing notes for details. FINDINGS: A total of approximately 6.4 L of clear yellow fluid was removed. IMPRESSION: Successful ultrasound-guided paracentesis yielding 6.4 liters of peritoneal fluid. PLAN: The patient has reportedly had a TIPS procedure at [facility] on 12/26/2021 and is awaiting liver transplant there, she is  followed by the liver clinic at [facility]. This exam was performed by Tsosie Billing PA-C, and was supervised and interpreted by Dr. Kathlene Cote. Electronically Signed   By: Aletta Edouard M.D.   On: 02/25/2022 12:36   US Paracentesis  Result Date: 02/19/2022 INDICATION: Patient with history of NASH cirrhosis and recurrent ascites status post tips procedure at [facility], maximum volume request of 8 L to be drawn off per ordering provider. EXAM: ULTRASOUND GUIDED  PARACENTESIS MEDICATIONS: Local 1% lidocaine only. COMPLICATIONS: None immediate. PROCEDURE: Informed written consent was obtained from the patient after a discussion of the risks, benefits and alternatives to treatment. A timeout was performed prior to the initiation of the procedure. Initial ultrasound scanning demonstrates a large amount of ascites within the right upper abdominal quadrant. The right upper abdomen was prepped and draped in the usual sterile fashion. 1% lidocaine was used for local anesthesia. Following this, a 19 gauge, 10-cm, Yueh catheter was introduced. An ultrasound image was saved for documentation purposes. The paracentesis was performed. The catheter was removed and a dressing was applied. The patient tolerated the procedure well without immediate post procedural complication. FINDINGS: A total of approximately 7.5 L of clear yellow fluid was removed. IMPRESSION: Successful ultrasound-guided paracentesis yielding 7.5 L liters of peritoneal fluid. PLAN: The patient has reportedly had a TIPS procedure at [facility] on 12/26/2021 and is awaiting liver transplant there, she is followed by the liver clinic at [facility]. This exam was performed by Tsosie Billing PA-C, and was supervised and interpreted by Dr. Denna Haggard. Electronically Signed   By: Albin Felling M.D.   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   GI consulted in ED regarding appropriate management given ongoing rectal bleeding and significant drop in Hgb. Given patients history of ongoing rectal bleeding s/p IR intervention and recent TIPS revision it is felt she would need to be seen by IR for consideration of further intervention and that endoscopic interventions by GI would be limited at this time.     LOS: 0 days   We would like to thank you for the opportunity to participate in the care of [place].  Laureen Ochs. Bernarda Caffey [facility] [phone] 8/3/20239:06 PM

## 2022-03-21 ENCOUNTER — Inpatient Hospital Stay: Payer: 59 | Admitting: Physician Assistant

## 2022-03-21 ENCOUNTER — Inpatient Hospital Stay: Payer: 59

## 2022-03-21 DIAGNOSIS — D649 Anemia, unspecified: Secondary | ICD-10-CM

## 2022-03-21 DIAGNOSIS — K7581 Nonalcoholic steatohepatitis (NASH): Secondary | ICD-10-CM

## 2022-03-21 DIAGNOSIS — D563 Thalassemia minor: Secondary | ICD-10-CM

## 2022-03-21 DIAGNOSIS — N1832 Chronic kidney disease, stage 3b: Secondary | ICD-10-CM | POA: Diagnosis not present

## 2022-03-21 DIAGNOSIS — K922 Gastrointestinal hemorrhage, unspecified: Secondary | ICD-10-CM

## 2022-03-21 DIAGNOSIS — K7460 Unspecified cirrhosis of liver: Secondary | ICD-10-CM

## 2022-03-21 DIAGNOSIS — D62 Acute posthemorrhagic anemia: Secondary | ICD-10-CM | POA: Diagnosis not present

## 2022-03-21 LAB — BASIC METABOLIC PANEL
Anion gap: 5 (ref 5–15)
BUN: 37 mg/dL — ABNORMAL HIGH (ref 8–23)
CO2: 21 mmol/L — ABNORMAL LOW (ref 22–32)
Calcium: 8 mg/dL — ABNORMAL LOW (ref 8.9–10.3)
Chloride: 108 mmol/L (ref 98–111)
Creatinine, Ser: 1.94 mg/dL — ABNORMAL HIGH (ref 0.44–1.00)
GFR, Estimated: 28 mL/min — ABNORMAL LOW (ref >60)
Glucose, Bld: 134 mg/dL — ABNORMAL HIGH (ref 70–99)
Potassium: 3.9 mmol/L (ref 3.5–5.1)
Sodium: 134 mmol/L — ABNORMAL LOW (ref 135–145)

## 2022-03-21 LAB — CBC
HCT: 23.2 % — ABNORMAL LOW (ref 36.0–46.0)
HCT: 26.7 % — ABNORMAL LOW (ref 36.0–46.0)
Hemoglobin: 7.4 g/dL — ABNORMAL LOW (ref 12.0–15.0)
Hemoglobin: 8.5 g/dL — ABNORMAL LOW (ref 12.0–15.0)
MCH: 27 pg (ref 26.0–34.0)
MCH: 26.6 pg (ref 26.0–34.0)
MCHC: 31.9 g/dL (ref 30.0–36.0)
MCHC: 31.8 g/dL (ref 30.0–36.0)
MCV: 84.7 fL (ref 80.0–100.0)
MCV: 83.4 fL (ref 80.0–100.0)
Platelets: 154 10*3/uL (ref 150–400)
Platelets: 201 10*3/uL (ref 150–400)
RBC: 2.74 MIL/uL — ABNORMAL LOW (ref 3.87–5.11)
RBC: 3.2 MIL/uL — ABNORMAL LOW (ref 3.87–5.11)
RDW: 20.6 % — ABNORMAL HIGH (ref 11.5–15.5)
RDW: 20.1 % — ABNORMAL HIGH (ref 11.5–15.5)
WBC: 5.2 10*3/uL (ref 4.0–10.5)
WBC: 5.8 10*3/uL (ref 4.0–10.5)
nRBC: 0 % (ref 0.0–0.2)
nRBC: 0.3 % — ABNORMAL HIGH (ref 0.0–0.2)

## 2022-03-21 LAB — BASIC METABOLIC PANEL WITH GFR
Anion gap: 6 (ref 5–15)
BUN: 37 mg/dL — ABNORMAL HIGH (ref 8–23)
CO2: 19 mmol/L — ABNORMAL LOW (ref 22–32)
Calcium: 7.9 mg/dL — ABNORMAL LOW (ref 8.9–10.3)
Chloride: 109 mmol/L (ref 98–111)
Creatinine, Ser: 1.95 mg/dL — ABNORMAL HIGH (ref 0.44–1.00)
GFR, Estimated: 28 mL/min — ABNORMAL LOW
Glucose, Bld: 144 mg/dL — ABNORMAL HIGH (ref 70–99)
Potassium: 4 mmol/L (ref 3.5–5.1)
Sodium: 134 mmol/L — ABNORMAL LOW (ref 135–145)

## 2022-03-21 LAB — CBG MONITORING, ED
Glucose-Capillary: 122 mg/dL — ABNORMAL HIGH (ref 70–99)
Glucose-Capillary: 119 mg/dL — ABNORMAL HIGH (ref 70–99)

## 2022-03-21 LAB — GLUCOSE, CAPILLARY: Glucose-Capillary: 106 mg/dL — ABNORMAL HIGH (ref 70–99)

## 2022-03-21 MED ORDER — POLYETHYLENE GLYCOL 3350 17 G PO PACK
17.0000 g | PACK | Freq: Two times a day (BID) | ORAL | Status: DC
  Administered 2022-03-22 – 2022-03-28 (×7): 17 g via ORAL
  Filled 2022-03-21 (×9): qty 1

## 2022-03-21 MED ORDER — ACETAMINOPHEN 325 MG PO TABS
325.0000 mg | ORAL_TABLET | Freq: Four times a day (QID) | ORAL | Status: DC | PRN
  Administered 2022-03-23 – 2022-03-28 (×4): 325 mg via ORAL
  Filled 2022-03-21 (×4): qty 1

## 2022-03-21 NOTE — ED Notes (Signed)
Pt assisted to restroom via wheelchair.

## 2022-03-21 NOTE — Consult Note (Signed)
Chief Complaint: Rectal bleeding  Referring Physician(s): Elgergawy  Supervising Physician: Ruthann Cancer  Patient Status: [facility] - In-pt  History of Present Illness: Rachel Huber is a 66 y.o. female who is well known to our service.  She has NASH cirrhosis status post TIPS (May 2023, [facility]) with recurrent symptomatic, hemorrhagic grade 4 internal hemorrhoids with associated anemia requiring transfusions.    She is status post superior rectal artery embolization on 02/07/22 at [facility].    She had additional bleeding after discharge and was re-admitted requiring additional blood transfusions from 03/05/22 to 03/07/22.     She underwent TIPS revision at [facility] recently and they increased TIPS diameter from 8 to 10 mm.   She was seen in the hematology clinic and was found to be profoundly anemia with a hemoglobin of 3.9.  She was sent to the ED at [facility].   She states she has bleeding with just about every bowel movement.   She tells me before the embolization it seem like a "faucet" and after the embolization it is more like a "drip".  She is NOT currently bleeding at this time.  Review of Labs show her Hemoglobin is 7.4 and stable and her creatinine is 1.94 with GFR 28.  She understands that if CTA and/or angiography is needed, the contrast could cause nephropathy which may require hemodialysis. She is willing to take that risk for life saving measures.  She is currently on a clear liquid diet. She is not on any anticoagulation.  Past Medical History:  Diagnosis Date   Anxiety    Arthritis    Cirrhosis of liver ([place])    Diabetes mellitus without complication (HCC)    Dyspnea    DOE   GERD (gastroesophageal reflux disease)    Hepatitis    PAST   Hypertension    Neuropathy    Neuropathy, diabetic ([employer])    Pneumonia    Sinus complaint    Sleep apnea    CPAP   Thalassemia minor 1992    Past Surgical History:  Procedure Laterality Date   ABDOMINAL HYSTERECTOMY      BREAST BIOPSY Right 02/15/2018   [place] bx 6-6:30 ribbon shape, ONE CORE FRAGMENT WITH FIBROSIS. ONE CORE FRAGMENT WITH PORTION OF A DILATED   BREAST BIOPSY Right 02/15/2018   [place] bx 9:00 heart shape, USUAL DUCTAL HYPERPLASIA   BREAST LUMPECTOMY Right 03/09/2018   Procedure: BREAST LUMPECTOMY x 2;  Surgeon: Benjamine Sprague, DO;  Location: [facility] ORS;  Service: General;  Laterality: Right;   CATARACT EXTRACTION W/PHACO Left 06/11/2016   Procedure: CATARACT EXTRACTION PHACO AND INTRAOCULAR LENS PLACEMENT (IOC);  Surgeon: Estill Cotta, MD;  Location: [facility] ORS;  Service: Ophthalmology;  Laterality: Left;  Lot # [id number] H US:01:38.6 AP%:26.4 CDE:44.15   CATARACT EXTRACTION W/PHACO Right 06/15/2018   Procedure: CATARACT EXTRACTION PHACO AND INTRAOCULAR LENS PLACEMENT (IOC);  Surgeon: Birder Robson, MD;  Location: [facility] ORS;  Service: Ophthalmology;  Laterality: Right;  [place] 00:38.2 CDE 4.23 Fluid Pack Lot # [id number]   COLONOSCOPIES     COLONOSCOPY WITH PROPOFOL N/A 10/10/2020   Procedure: COLONOSCOPY WITH PROPOFOL;  Surgeon: Lesly Rubenstein, MD;  Location: [facility] ENDOSCOPY;  Service: Endoscopy;  Laterality: N/A;   COLONOSCOPY WITH PROPOFOL N/A 11/20/2020   Procedure: COLONOSCOPY WITH PROPOFOL;  Surgeon: Lesly Rubenstein, MD;  Location: [facility] ENDOSCOPY;  Service: Endoscopy;  Laterality: N/A;  DM STAT CBC, BMP COVID POSITIVE 09/02/2020   CTR     ESOPHAGOGASTRODUODENOSCOPY (EGD) WITH PROPOFOL  N/A 10/10/2020   Procedure: ESOPHAGOGASTRODUODENOSCOPY (EGD) WITH PROPOFOL;  Surgeon: Lesly Rubenstein, MD;  Location: [facility] ENDOSCOPY;  Service: Endoscopy;  Laterality: N/A;  COVID POSITIVE 10/08/2020   FLEXIBLE SIGMOIDOSCOPY N/A 02/06/2022   Procedure: FLEXIBLE SIGMOIDOSCOPY;  Surgeon: Lesly Rubenstein, MD;  Location: [facility] ENDOSCOPY;  Service: Endoscopy;  Laterality: N/A;  Patient requests anesthesia   IR ANGIOGRAM SELECTIVE EACH ADDITIONAL VESSEL  02/07/2022   IR ANGIOGRAM SELECTIVE EACH ADDITIONAL VESSEL   02/07/2022   IR ANGIOGRAM VISCERAL SELECTIVE  02/07/2022   IR EMBO ARTERIAL NOT HEMORR HEMANG INC GUIDE ROADMAPPING  02/07/2022   IR PARACENTESIS  12/17/2021   IR RADIOLOGIST EVAL & MGMT  03/18/2022   IR US GUIDE VASC ACCESS RIGHT  02/07/2022   JOINT REPLACEMENT     KNEE SURGERY Right 09/24/2017   plates and pins   TOTAL SHOULDER ARTHROPLASTY Right 09/27/2015    Allergies: Gramineae pollens and Latex  Medications: Prior to Admission medications   Medication Sig Start Date End Date Taking? Authorizing Provider  acetaminophen (TYLENOL) 650 MG CR tablet Take 1,300 mg by mouth daily as needed for pain.   Yes [provider]  albuterol (VENTOLIN HFA) 108 (90 Base) MCG/ACT inhaler Inhale 2 puffs into the lungs every 6 (six) hours as needed for wheezing or shortness of breath. 02/10/22  Yes Wieting, Richard, MD  atorvastatin (LIPITOR) 10 MG tablet Take 10 mg by mouth daily. 01/28/21  Yes [provider]  Cholecalciferol 50 MCG (2000 UT) CAPS Take 2,000 Units by mouth daily.   Yes [provider]  diphenhydrAMINE (BENADRYL) 25 MG tablet Take 25 mg by mouth 2 (two) times daily as needed for itching.   Yes [provider]  gabapentin (NEURONTIN) 400 MG capsule Take 800 mg by mouth 2 (two) times daily. 01/07/18  Yes [provider]  hydrocortisone (ANUSOL-HC) 2.5 % rectal cream Place rectally 4 (four) times daily. 03/07/22  Yes Wieting, Richard, MD  lactulose, encephalopathy, (CHRONULAC) 10 GM/15ML SOLN Take 30 mLs (20 g total) by mouth daily. Patient taking differently: Take 15 g by mouth daily. 01/30/22  Yes Emokpae, Courage, MD  melatonin 5 MG TABS Take 10 mg by mouth at bedtime as needed (sleep).   Yes [provider]  Multiple Vitamins-Minerals (MULTIVITAMIN WITH MINERALS) tablet Take 1 tablet by mouth daily.   Yes [provider]  sertraline (ZOLOFT) 50 MG tablet Take 50 mg by mouth daily. 12/12/17  Yes [provider]  sodium  chloride (OCEAN) 0.65 % SOLN nasal spray Place 1 spray into both nostrils as needed for congestion. Patient taking differently: Place 1 spray into both nostrils at bedtime. 02/10/22  Yes Wieting, Richard, MD  vitamin B-12 (CYANOCOBALAMIN) 1000 MCG tablet Take 1,000 mcg by mouth daily.   Yes [provider]  witch hazel-glycerin (TUCKS) pad Apply topically as needed for hemorrhoids. 03/07/22  Yes Loletha Grayer, MD     Family History  Problem Relation Age of Onset   Breast cancer Mother 52   Lymphoma Mother    Diabetes Father    Kidney cancer Father    Heart disease Father    Diabetes Sister    Breast cancer Sister 34    Social History   Socioeconomic History   Marital status: Married    Spouse name: Not on file   Number of children: 0   Years of education: Not on file   Highest education level: Not on file  Occupational History   Occupation: EMPLOYED  Tobacco  Use   Smoking status: Never   Smokeless tobacco: Never  Vaping Use   Vaping Use: Never used  Substance and Sexual Activity   Alcohol use: No   Drug use: Never   Sexual activity: Not Currently  Other Topics Concern   Not on file  Social History Narrative   Not on file   Social Determinants of Health   Financial Resource Strain: Low Risk  (01/31/2021)   Overall Financial Resource Strain (CARDIA)    Difficulty of Paying Living Expenses: Not hard at all  Food Insecurity: No Food Insecurity (01/31/2021)   Hunger Vital Sign    Worried About Running Out of Food in the Last Year: Never true    Saks in the Last Year: Never true  Transportation Needs: No Transportation Needs (01/31/2021)   PRAPARE - Hydrologist (Medical): No    Lack of Transportation (Non-Medical): No  Physical Activity: Inactive (01/31/2021)   Exercise Vital Sign    Days of Exercise per Week: 0 days    Minutes of Exercise per Session: 0 min  Stress: No Stress Concern Present (01/31/2021)   [place]    Feeling of Stress : Not at all  Social Connections: Moderately Isolated (01/31/2021)   Social Connection and Isolation Panel [NHANES]    Frequency of Communication with Friends and Family: More than three times a week    Frequency of Social Gatherings with Friends and Family: Once a week    Attends Religious Services: Never    Marine scientist or Organizations: No    Attends Archivist Meetings: Never    Marital Status: Married     Review of Systems: A 12 point ROS discussed and pertinent positives are indicated .  All other systems are negative.  Review of Systems  Constitutional:  Positive for activity change.  Gastrointestinal:  Positive for anal bleeding and blood in stool.    Vital Signs: BP 126/65 (BP Location: Right Wrist)   Pulse 75   Temp 98.2 F (36.8 C)   Resp 16   Ht 5' 9"  (1.753 m)   Wt 249 lb 11.2 oz (113.3 kg)   SpO2 100%   BMI 36.87 kg/m   Physical Exam Vitals reviewed.  Constitutional:      Appearance: Normal appearance.  HENT:     Head: Normocephalic and atraumatic.  Cardiovascular:     Rate and Rhythm: Normal rate and regular rhythm.  Pulmonary:     Effort: Pulmonary effort is normal. No respiratory distress.     Breath sounds: Normal breath sounds.  Abdominal:     General: There is no distension.     Palpations: Abdomen is soft.     Tenderness: There is no abdominal tenderness.  Musculoskeletal:        General: Normal range of motion.  Skin:    General: Skin is warm and dry.  Neurological:     General: No focal deficit present.     Mental Status: She is alert and oriented to person, place, and time.  Psychiatric:        Mood and Affect: Mood normal.        Behavior: Behavior normal.        Thought Content: Thought content normal.        Judgment: Judgment normal.     Imaging: IR Radiologist Eval & Mgmt  Result Date: 03/18/2022 Please refer to  notes  tab for details about interventional procedure. (Op Note)  US Paracentesis  Result Date: 03/18/2022 INDICATION: History of NASH cirrhosis. Recent history of tips procedure at outside facility. Recurrent ascites. Request for therapeutic paracentesis. EXAM: ULTRASOUND GUIDED RIGHT LOWER QUADRANT PARACENTESIS MEDICATIONS: 1% plain lidocaine, 12 mL COMPLICATIONS: None immediate. PROCEDURE: Informed written consent was obtained from the patient after a discussion of the risks, benefits and alternatives to treatment. A timeout was performed prior to the initiation of the procedure. Initial ultrasound scanning demonstrates a large amount of ascites within the right lower abdominal quadrant. The right lower abdomen was prepped and draped in the usual sterile fashion. 1% lidocaine was used for local anesthesia. Following this, a 19 gauge, 7-cm, Yueh catheter was introduced. An ultrasound image was saved for documentation purposes. The paracentesis was performed. The catheter was removed and a dressing was applied. The patient tolerated the procedure well without immediate post procedural complication. FINDINGS: A total of approximately 6.8 L of clear yellow fluid was removed. IMPRESSION: Successful ultrasound-guided paracentesis yielding 6.8 liters of peritoneal fluid. Read by: Ascencion Dike PA-C Electronically Signed   By: Aletta Edouard M.D.   On: 03/18/2022 13:18   US Paracentesis  Result Date: 03/11/2022 INDICATION: Patient with history of NASH cirrhosis and recurrent ascites status post tips procedure at [facility], maximum volume request of 8 L to be drawn off per ordering provider. EXAM: ULTRASOUND GUIDED  PARACENTESIS MEDICATIONS: Local 1% lidocaine only. COMPLICATIONS: None immediate. PROCEDURE: Informed written consent was obtained from the patient after a discussion of the risks, benefits and alternatives to treatment. A timeout was performed prior to the initiation of the procedure. Initial ultrasound scanning  demonstrates a large amount of ascites within the right upper abdominal quadrant. The right upper abdomen was prepped and draped in the usual sterile fashion. 1% lidocaine was used for local anesthesia. Following this, a 19 gauge, 10-cm, Yueh catheter was introduced. An ultrasound image was saved for documentation purposes. The paracentesis was performed. The catheter was removed and a dressing was applied. The patient tolerated the procedure well without immediate post procedural complication. Patient received post-procedure intravenous albumin; see nursing notes for details. FINDINGS: A total of approximately 7.2 L of clear yellow fluid was removed. IMPRESSION: Successful ultrasound-guided paracentesis yielding 7.2 liters of peritoneal fluid. PLAN: The patient has reportedly had a TIPS procedure at [facility] on 12/26/2021 and is awaiting liver transplant there, she is followed by the liver clinic at [facility]. She is also scheduled for a TIPS revision next week. This exam was performed by Tsosie Billing PA-C, and was supervised and interpreted by Dr. Kathlene Cote. Electronically Signed   By: Aletta Edouard M.D.   On: 03/11/2022 14:32   US Paracentesis  Result Date: 03/04/2022 INDICATION: Patient with history of NASH cirrhosis and recurrent ascites status post tips procedure at [facility], maximum volume request of 8 L to be drawn off per ordering provider. EXAM: ULTRASOUND GUIDED  PARACENTESIS MEDICATIONS: 1% lidocaine only. COMPLICATIONS: None immediate. PROCEDURE: Informed written consent was obtained from the patient after a discussion of the risks, benefits and alternatives to treatment. A timeout was performed prior to the initiation of the procedure. Initial ultrasound scanning demonstrates a large amount of ascites within the right upper abdominal quadrant. The right upper abdomen was prepped and draped in the usual sterile fashion. 1% lidocaine was used for local anesthesia. Following this, a 19 gauge,  10-cm, Yueh catheter was introduced. An ultrasound image was saved for documentation purposes. The paracentesis  was performed. The catheter was removed and a dressing was applied. The patient tolerated the procedure well without immediate post procedural complication. Patient received post-procedure intravenous albumin; see nursing notes for details. FINDINGS: A total of approximately 7 Liters of clear yellow fluid was removed. IMPRESSION: Successful ultrasound-guided paracentesis yielding 7 liters of peritoneal fluid. PLAN: The patient has reportedly had a TIPS procedure at [facility] on 12/26/2021 and is awaiting liver transplant there, she is followed by the liver clinic at [facility]. This exam was performed by Tsosie Billing PA-C, and was supervised and interpreted by Dr. Annamaria Boots Electronically Signed   By: Jerilynn Mages.  Shick M.D.   On: 03/04/2022 12:35   US Paracentesis  Result Date: 02/25/2022 INDICATION: Patient with history of NASH cirrhosis and recurrent ascites status post tips procedure at [facility], maximum volume request of 8 L to be drawn off per ordering provider. EXAM: ULTRASOUND GUIDED  PARACENTESIS MEDICATIONS: Local 1% lidocaine only. COMPLICATIONS: None immediate. PROCEDURE: Informed written consent was obtained from the patient after a discussion of the risks, benefits and alternatives to treatment. A timeout was performed prior to the initiation of the procedure. Initial ultrasound scanning demonstrates a large amount of ascites within the right lower abdominal quadrant. The right lower abdomen was prepped and draped in the usual sterile fashion. 1% lidocaine was used for local anesthesia. Following this, a 19 gauge, 10-cm, Yueh catheter was introduced. An ultrasound image was saved for documentation purposes. The paracentesis was performed. The catheter was removed and a dressing was applied. The patient tolerated the procedure well without immediate post procedural complication. Patient received  post-procedure intravenous albumin; see nursing notes for details. FINDINGS: A total of approximately 6.4 L of clear yellow fluid was removed. IMPRESSION: Successful ultrasound-guided paracentesis yielding 6.4 liters of peritoneal fluid. PLAN: The patient has reportedly had a TIPS procedure at [facility] on 12/26/2021 and is awaiting liver transplant there, she is followed by the liver clinic at [facility]. This exam was performed by Tsosie Billing PA-C, and was supervised and interpreted by Dr. Kathlene Cote. Electronically Signed   By: Aletta Edouard M.D.   On: 02/25/2022 12:36    Labs:  CBC: Recent Labs    03/20/22 1153 03/20/22 1307 03/21/22 0120 03/21/22 0604  WBC 6.9 7.4 5.8 5.2  HGB 3.9* 4.0* 6.4* 7.4*  HCT 13.2* 13.4* 19.8* 23.2*  PLT 205 211 173 154    COAGS: Recent Labs    01/27/22 1808 01/28/22 1707 02/04/22 1245 03/05/22 1650  INR 1.2 1.2 1.3* 1.2  APTT  --   --  31  --     BMP: Recent Labs    03/13/22 1315 03/20/22 1307 03/21/22 0120 03/21/22 0604  NA 136 135 134* 134*  K 3.9 4.4 4.0 3.9  CL 109 108 109 108  CO2 23 21* 19* 21*  GLUCOSE 227* 200* 144* 134*  BUN 35* 39* 37* 37*  CALCIUM 8.2* 8.3* 7.9* 8.0*  CREATININE 1.68* 2.19* 1.95* 1.94*  GFRNONAA 34* 25* 28* 28*    LIVER FUNCTION TESTS: Recent Labs    02/10/22 0517 03/05/22 1639 03/13/22 1315 03/20/22 1307  BILITOT 1.4* 1.5* 1.8* 2.1*  AST 47* 42* 49* 45*  ALT 24 24 27 25   ALKPHOS 99 111 109 104  PROT 4.9* 4.7* 4.9* 4.5*  ALBUMIN 2.4* 2.5* 2.6* 2.4*    TUMOR MARKERS: No results for input(s): "AFPTM", "CEA", "CA199", "CHROMGRNA" in the last 8760 hours.  Assessment and Plan:  Persistent rectal bleeding despite embolization.  Currently she is stable without bleeding at this time.  Review of Labs show her Hemoglobin is 7.4 and stable and her creatinine is 1.94 with GFR 28.  She understands that if CTA and/or angiography is needed, the contrast could cause nephropathy which may  require hemodialysis. She is willing to take that risk for life saving measures.  She is currently on a clear liquid diet. I recommend she continue this just in case she were to need urgent intervention.  Discussion with Dr. Serafina Royals. Hold off on CT since she is not actively bleeding.   Could consider a second look angiography.  She is inquiring about Miralax to losen stools in hopes of avoiding further bleeding with bowel movements.  The risks and benefits of embolization were discussed with the patient including, but not limited to bleeding, infection, vascular injury, post operative pain, or contrast induced renal failure.  This procedure involves the use of X-rays and because of the nature of the planned procedure, it is possible that we will have prolonged use of X-ray fluoroscopy.  Potential radiation risks to you include (but are not limited to) the following: - A slightly elevated risk for cancer several years later in life. This risk is typically less than 0.5% percent. This risk is low in comparison to the normal incidence of human cancer, which is 33% for women and 50% for men according to the [place]. - Radiation induced injury can include skin redness, resembling a rash, tissue breakdown / ulcers and hair loss (which can be temporary or permanent).   The likelihood of either of these occurring depends on the difficulty of the procedure and whether you are sensitive to radiation due to previous procedures, disease, or genetic conditions.   IF your procedure requires a prolonged use of radiation, you will be notified and given written instructions for further action.  It is your responsibility to monitor the irradiated area for the 2 weeks following the procedure and to notify your physician if you are concerned that you have suffered a radiation induced injury.    All of the patient's questions were answered, patient is agreeable to proceed if necessary.  She  would need to sign consent.  Thank you for allowing our service to participate in Rachel Huber 's care.  Electronically Signed: Murrell Redden, PA-C   03/21/2022, 3:47 PM      I spent a total of    25 Minutes in face to face in clinical consultation, greater than 50% of which was counseling/coordinating care for embolization for lower GI bleed.

## 2022-03-21 NOTE — Assessment & Plan Note (Signed)
-   Most recent A1c 5.7 -Follow CBGs and adjust hypoglycemic regimen as needed. -Continue sliding scale insulin. -Currently receiving clear liquid diet.

## 2022-03-21 NOTE — Progress Notes (Signed)
Progress Note   Patient: Rachel Huber GPQ:[id number] DOB: 02/07/1956 DOA: 03/20/2022     1 DOS: the patient was seen and examined on 03/21/2022   Brief hospital admission course: As per H&P written by Dr. Waldron Labs on 03/20/22 Geraldina Parrott  is a 66 y.o. female,  with past medical history significant for NASH cirrhosis with portal hypertension, status post TIPS May 2023 at [facility], HTN, DM type II, depression, thalassemia minor, OSA on CPAP and history of bleeding hemorrhoids requiring transfusion s/p  particle and coil embolization of rectal artery 02/07/2022 with Dr. Serafina Royals, she is following at [facility] , status post TIPS revision at [facility] with TIPS diameter increased from 8 to 10 mm, she is getting weekly paracentesis. -Patient was sent by hematology clinic as routine labs were done showing hemoglobin of 3.9, in ED repeat labs confirming hemoglobin of 3.9, patient reports dyspnea, fatigue, generalized weakness, she reports she is laying on the couch all day due to fatigue as well, she reports intermittent rectal bleed due to hemorrhoids, reported it did improve initially after her embolization but has been intermittent and recurrent at a lower rate over the last few days, she had paracentesis done yesterday by IR, which is done routinely once a week at [facility]. -In ED patient hemoglobin was 3.9, she was ordered 4 units PRBC, creatinine up from 1.5-2.19, she was seen by GI at [facility] Dr. Jenetta Downer at bedside who recommended IR input, I have discussed with Dr. Serafina Royals, who did recommend admission to [facility] for IR evaluation, and to get GI involved when she gets to [facility], she was ordered 4 units PRBC by ED physician, [facility]ist consulted to admit.  Assessment and Plan: * Acute blood loss anemia - In the setting of internal hemorrhoids and rectal varices. -Continue to follow hemoglobin trend and transfuse as needed -Patient will be transferred to [facility] for  interventional radiology evaluation and management; needing embolization review.  GI bleeding - Hemoglobin 3.9 at time of admission -Appears to be secondary to rectal bleed and rectal varices; also with known history of internal hemorrhoids -4 unit PRBCs-transfuse uncover hemoglobin 7.4. -Case discussed with GI service who has recommended also transfer to [facility] for IR evaluation and further management; patient might benefit of review embolization process. -Continue to follow hemoglobin trend -Further transfuse as needed (transfusion threshold hemoglobin less than 7).  Liver cirrhosis secondary to NASH [facility]) - Status post TIPS and TIPS revision; patient actively following at [facility]. -Sinusitis and lower extremity edema appreciated (according to patient at baseline); she had approximately 2 days ago paracentesis done. -Continue to follow gastroenterology service recommendations. -Continue the use of lactulose. -Not chronically on Lasix or Aldactone.  HLD (hyperlipidemia) -Continue the use of Lipitor. -Heart healthy diet discussed with patient.  Chronic kidney disease, stage 3b ([place]) - With mild acute kidney injury component in the setting of decreased perfusion with anemia -Baseline creatinine around 1.5; creatinine 2.2 at time of admission -Continue blood transfusion, minimize the use of nephrotoxic agents, avoid hypotension and the use of contrast as much as possible. -Follow renal function trend.  Thalassemia minor - Continue outpatient follow-up with hematology service. -Acute anemia process in the setting of GI bleed. -Continue transfusion as needed.  Type 2 diabetes mellitus with hyperlipidemia (HCC) - Most recent A1c 5.7 -Follow CBGs and adjust hypoglycemic regimen as needed. -Continue sliding scale insulin. -Currently receiving clear liquid diet.  Class II obesity -Low calorie diet and portion control discussed  with patient -Body mass index is 36.87  kg/m.   Subjective:  Reports feeling weak and tired; no chest pain, no nausea, no vomiting.  Currently reporting no abdominal pain and expressed breathing is stable.  Good saturation on room air.  Physical Exam: Vitals:   03/21/22 1330 03/21/22 1356 03/21/22 1404 03/21/22 1508  BP: (!) 105/51   126/65  Pulse: 73 77  75  Resp: 13 13  16   Temp:   98.2 F (36.8 C)   TempSrc:    Oral  SpO2: 96% 100%    Weight:    113.3 kg  Height:    5' 9"  (1.753 m)   General exam: Alert, awake, oriented x 3; denies chest pain, palpitations and shortness of breath.  Currently afebrile. Respiratory system: Good air movement bilaterally; no using accessory muscle.  Good saturation on room air. Cardiovascular system:RRR. No rubs or gallops. Gastrointestinal system: Abdomen is obese, nondistended, soft and nontender. No organomegaly or masses felt. Normal bowel sounds heard. Central nervous system: Alert and oriented. No focal neurological deficits. Extremities: No cyanosis or clubbing; 1+ edema appreciated bilaterally. Skin: No petechiae; chronic stasis dermatitis appreciated bilaterally.  No open wounds. Psychiatry: Judgement and insight appear normal. Mood & affect appropriate.   Data Reviewed: CBC: WBCs 5.2, hemoglobin 7.4 and platelets count 808 K Basic metabolic panel: Sodium 811, potassium 3.9, chloride 108, bicarb 21, creatinine 1.9, BUN 37.  Family Communication: No family at bedside.  Disposition: Status is: Inpatient Remains inpatient appropriate because: Patient with ongoing acute GI bleed in the setting of rectal varices and internal hemorrhoids.  Transferring to [facility] for IR evaluation with anticipation for embolization review.  Continue to follow hemoglobin trend and further transfuse as needed.   Planned Discharge Destination: Home   Author: Barton Dubois, MD 03/21/2022 3:56 PM  For on call review [email or web address].

## 2022-03-21 NOTE — Plan of Care (Signed)
  Problem: Health Behavior/Discharge Planning: Goal: Ability to identify and utilize available resources and services will improve Outcome: Progressing Goal: Ability to manage health-related needs will improve Outcome: Progressing   Problem: Nutritional: Goal: Maintenance of adequate nutrition will improve Outcome: Progressing Goal: Progress toward achieving an optimal weight will improve Outcome: Progressing   Problem: Skin Integrity: Goal: Risk for impaired skin integrity will decrease Outcome: Progressing   Problem: Clinical Measurements: Goal: Respiratory complications will improve Outcome: Progressing   Problem: Clinical Measurements: Goal: Cardiovascular complication will be avoided Outcome: Progressing   Problem: Pain Managment: Goal: General experience of comfort will improve Outcome: Progressing   Problem: Safety: Goal: Ability to remain free from injury will improve Outcome: Progressing

## 2022-03-21 NOTE — Assessment & Plan Note (Signed)
-   Continue outpatient follow-up with hematology service. -Acute anemia process in the setting of GI bleed. -Continue transfusion as needed.

## 2022-03-21 NOTE — Assessment & Plan Note (Signed)
-   With mild acute kidney injury component in the setting of decreased perfusion with anemia -Baseline creatinine around 1.5; creatinine 2.2 at time of admission -Continue blood transfusion, minimize the use of nephrotoxic agents, avoid hypotension and the use of contrast as much as possible. -Follow renal function trend.

## 2022-03-21 NOTE — Assessment & Plan Note (Signed)
-   Status post TIPS and TIPS revision; patient actively following at [facility]. -Sinusitis and lower extremity edema appreciated (according to patient at baseline); she had approximately 2 days ago paracentesis done. -Continue to follow gastroenterology service recommendations. -Continue the use of lactulose. -Not chronically on Lasix or Aldactone.

## 2022-03-21 NOTE — Assessment & Plan Note (Signed)
-   In the setting of internal hemorrhoids and rectal varices. -Continue to follow hemoglobin trend and transfuse as needed -Patient will be transferred to [facility] for interventional radiology evaluation and management; needing embolization review.

## 2022-03-21 NOTE — ED Notes (Signed)
[facility] has been called for transport, they will be sending a truck when they can.

## 2022-03-21 NOTE — Assessment & Plan Note (Signed)
-  Continue the use of Lipitor. -Heart healthy diet discussed with patient.

## 2022-03-21 NOTE — ED Notes (Signed)
Blood completed.

## 2022-03-21 NOTE — Assessment & Plan Note (Signed)
-   Hemoglobin 3.9 at time of admission -Appears to be secondary to rectal bleed and rectal varices; also with known history of internal hemorrhoids -4 unit PRBCs-transfuse uncover hemoglobin 7.4. -Case discussed with GI service who has recommended also transfer to [facility] for IR evaluation and further management; patient might benefit of review embolization process. -Continue to follow hemoglobin trend -Further transfuse as needed (transfusion threshold hemoglobin less than 7).

## 2022-03-22 DIAGNOSIS — D62 Acute posthemorrhagic anemia: Secondary | ICD-10-CM | POA: Diagnosis not present

## 2022-03-22 LAB — BASIC METABOLIC PANEL
Anion gap: 8 (ref 5–15)
BUN: 32 mg/dL — ABNORMAL HIGH (ref 8–23)
CO2: 20 mmol/L — ABNORMAL LOW (ref 22–32)
Calcium: 8 mg/dL — ABNORMAL LOW (ref 8.9–10.3)
Chloride: 106 mmol/L (ref 98–111)
Creatinine, Ser: 1.85 mg/dL — ABNORMAL HIGH (ref 0.44–1.00)
GFR, Estimated: 30 mL/min — ABNORMAL LOW (ref >60)
Glucose, Bld: 103 mg/dL — ABNORMAL HIGH (ref 70–99)
Potassium: 3.8 mmol/L (ref 3.5–5.1)
Sodium: 134 mmol/L — ABNORMAL LOW (ref 135–145)

## 2022-03-22 LAB — GLUCOSE, CAPILLARY
Glucose-Capillary: 102 mg/dL — ABNORMAL HIGH (ref 70–99)
Glucose-Capillary: 93 mg/dL (ref 70–99)
Glucose-Capillary: 97 mg/dL (ref 70–99)
Glucose-Capillary: 164 mg/dL — ABNORMAL HIGH (ref 70–99)

## 2022-03-22 LAB — HEPATIC FUNCTION PANEL
ALT: 24 U/L (ref 0–44)
AST: 48 U/L — ABNORMAL HIGH (ref 15–41)
Albumin: 2.1 g/dL — ABNORMAL LOW (ref 3.5–5.0)
Alkaline Phosphatase: 82 U/L (ref 38–126)
Bilirubin, Direct: 1 mg/dL — ABNORMAL HIGH (ref 0.0–0.2)
Indirect Bilirubin: 2 mg/dL — ABNORMAL HIGH (ref 0.3–0.9)
Total Bilirubin: 3 mg/dL — ABNORMAL HIGH (ref 0.3–1.2)
Total Protein: 3.9 g/dL — ABNORMAL LOW (ref 6.5–8.1)

## 2022-03-22 LAB — CBC
HCT: 22.6 % — ABNORMAL LOW (ref 36.0–46.0)
Hemoglobin: 7.5 g/dL — ABNORMAL LOW (ref 12.0–15.0)
MCH: 26.8 pg (ref 26.0–34.0)
MCHC: 33.2 g/dL (ref 30.0–36.0)
MCV: 80.7 fL (ref 80.0–100.0)
Platelets: 155 10*3/uL (ref 150–400)
RBC: 2.8 MIL/uL — ABNORMAL LOW (ref 3.87–5.11)
RDW: 20.3 % — ABNORMAL HIGH (ref 11.5–15.5)
WBC: 4.5 10*3/uL (ref 4.0–10.5)
nRBC: 0 % (ref 0.0–0.2)

## 2022-03-22 LAB — APTT: aPTT: 30 seconds (ref 24–36)

## 2022-03-22 LAB — PROTIME-INR
INR: 1.3 — ABNORMAL HIGH (ref 0.8–1.2)
Prothrombin Time: 15.7 seconds — ABNORMAL HIGH (ref 11.4–15.2)

## 2022-03-22 MED ORDER — HYDROCORTISONE ACETATE 25 MG RE SUPP
25.0000 mg | Freq: Two times a day (BID) | RECTAL | Status: DC
  Administered 2022-03-22 – 2022-03-27 (×7): 25 mg via RECTAL
  Filled 2022-03-22 (×13): qty 1

## 2022-03-22 NOTE — Progress Notes (Signed)
Rachel Huber  ION:[id number] DOB: May 02, 1956 DOA: 03/20/2022 PCP: [name], MD    Brief Narrative:  66 year old with a history of NASH cirrhosis with portal hypertension status post TIPS May 2023 and subsequent TIPS revision/dilation, recurrent bleeding hemorrhoids status post particle and coil embolization of rectal artery 6/23-23 at [facility], weekly paracentesis, DM2, HTN, depression, thalassemia minor, and OSA on CPAP who was sent to the [facility] ER by her hematology clinic after routine laboratory check revealed a hemoglobin of 3.9.  The patient admitted to dyspnea and fatigue and severe generalized weakness.  Hemoglobin at 3.9 was confirmed in the ER, and the patient was transfused a total of 4 units PRBC.  She was transferred to [facility] in case intervention was necessary, and IR has seen her in consultation.  Consultants:  IR  Goals of Care:  Code Status: Full Code   DVT prophylaxis: SCDs only due to active bleeding  Interim Hx: Hemodynamically stable with preserved blood pressure.  Hemoglobin appears to be holding steady.  Has had only one self-limited episode of BRBPR since admission, which was this morning, and has not recurred.  Denies any new complaints.  Is alert and in good spirits.  Assessment & Plan:  Acute blood loss anemia due to Lower GIB In the setting of internal hemorrhoids/rectal varices -hemoglobin confirmed to be 3.9 at presentation -status post 4 units PRBC total thus far -transferred to [facility] for IR evaluation as patient might require recurrent attempts at embolization if brisk bleeding resumes -appears to be stabilizing for now -hopefully she will not require any further intervention -we will continue to monitor as an inpatient for now  Cirrhosis of the liver secondary to NASH Status post TIPS and TIPS revision at [facility] where she is followed for potential transplant -requires frequent paracentesis -stable clinically at this time  HLD  Continue Lipitor as  per home dose  CKD stage IIIb mild acute kidney injury component in the setting of decreased perfusion with anemia - baseline creatinine around 1.5 w/ creatinine 2.2 at time of admission -creatinine improving with volume expansion  Thalassemia minor Continue outpatient follow-up with hematology service  DM2 Well-controlled at present  Family Communication: No family present at time of exam Disposition: From home -anticipate eventual discharge back home   Objective: Blood pressure (!) 116/50, pulse 76, temperature 97.6 F (36.4 C), temperature source Oral, resp. rate 16, height 5' 9"  (1.753 m), weight 113.3 kg, SpO2 98 %.  Intake/Output Summary (Last 24 hours) at 03/22/2022 1032 Last data filed at 03/22/2022 0500 Gross per 24 hour  Intake 480 ml  Output 350 ml  Net 130 ml   Filed Weights   03/20/22 1311 03/21/22 1508  Weight: 114.8 kg 113.3 kg    Examination: General: No acute respiratory distress Lungs: Clear to auscultation bilaterally without wheezes or crackles Cardiovascular: RRR without murmur or rub Abdomen: Nontender, nondistended, soft, bowel sounds positive, no rebound, no ascites, no appreciable mass Extremities: 1+ bilateral lower extremity edema  CBC: Recent Labs  Lab 03/20/22 1307 03/21/22 0120 03/21/22 0604 03/21/22 1556 03/22/22 0206  WBC 7.4   < > 5.2 5.8 4.5  NEUTROABS 5.0  --   --   --   --   HGB 4.0*   < > 7.4* 8.5* 7.5*  HCT 13.4*   < > 23.2* 26.7* 22.6*  MCV 78.4*   < > 84.7 83.4 80.7  PLT 211   < > 154 201 155   < > =  values in this interval not displayed.   Basic Metabolic Panel: Recent Labs  Lab 03/21/22 0120 03/21/22 0604 03/22/22 0206  NA 134* 134* 134*  K 4.0 3.9 3.8  CL 109 108 106  CO2 19* 21* 20*  GLUCOSE 144* 134* 103*  BUN 37* 37* 32*  CREATININE 1.95* 1.94* 1.85*  CALCIUM 7.9* 8.0* 8.0*   GFR: Estimated Creatinine Clearance: 41.2 mL/min (A) (by C-G formula based on SCr of 1.85 mg/dL (H)).  Liver Function  Tests: Recent Labs  Lab 03/20/22 1307 03/22/22 0206  AST 45* 48*  ALT 25 24  ALKPHOS 104 82  BILITOT 2.1* 3.0*  PROT 4.5* 3.9*  ALBUMIN 2.4* 2.1*   Coagulation Profile: Recent Labs  Lab 03/22/22 0206  INR 1.3*   HbA1C: Hgb A1c MFr Bld  Date/Time Value Ref Range Status  01/28/2022 05:07 AM 5.7 (H) 4.8 - 5.6 % Final    Comment:    (NOTE) Pre diabetes:          5.7%-6.4%  Diabetes:              >6.4%  Glycemic control for   <7.0% adults with diabetes   09/03/2020 04:41 AM 6.9 (H) 4.8 - 5.6 % Final    Comment:    (NOTE) Pre diabetes:          5.7%-6.4%  Diabetes:              >6.4%  Glycemic control for   <7.0% adults with diabetes     Scheduled Meds:  atorvastatin  10 mg Oral Daily   cholecalciferol  2,000 Units Oral Daily   cyanocobalamin  1,000 mcg Oral Daily   hydrocortisone   Rectal QID   lactulose  20 g Oral Daily   multivitamin with minerals  1 tablet Oral Daily   polyethylene glycol  17 g Oral BID   sertraline  50 mg Oral Daily     LOS: 2 days   Cherene Altes, MD [facility] Office  [phone] Pager - Text Page per Shea Evans  If 7PM-7AM, please contact night-coverage per Amion 03/22/2022, 10:32 AM

## 2022-03-22 NOTE — Progress Notes (Signed)
Referring Physician(s): Elgergawy  Supervising Physician: Ruthann Cancer  Patient Status:  [facility] - In-pt  Chief Complaint:  Rectal bleeding  Brief History:  Rachel Huber is a 66 y.o. female who is well known to our service.    She is status post superior rectal artery embolization on 02/07/22 at [facility].     She had additional bleeding after discharge and was re-admitted requiring additional blood transfusions from 03/05/22 to 03/07/22.      She was seen in the hematology clinic and was found to be profoundly anemia with a hemoglobin of 3.9.   She was sent to the ED at [facility].     She is NOT currently bleeding at this time.   Review of Labs show her Hemoglobin is 7.5 and stable and her creatinine is 1.85 with GFR 30.   Subjective:  No further bleeding, however she states she has not had a bowel movement yet. She is taking Miralax and has been on clear liquids.  Allergies: Gramineae pollens and Latex  Medications: Prior to Admission medications   Medication Sig Start Date End Date Taking? Authorizing Provider  acetaminophen (TYLENOL) 650 MG CR tablet Take 1,300 mg by mouth daily as needed for pain.   Yes [provider]  albuterol (VENTOLIN HFA) 108 (90 Base) MCG/ACT inhaler Inhale 2 puffs into the lungs every 6 (six) hours as needed for wheezing or shortness of breath. 02/10/22  Yes Wieting, Richard, MD  atorvastatin (LIPITOR) 10 MG tablet Take 10 mg by mouth daily. 01/28/21  Yes [provider]  Cholecalciferol 50 MCG (2000 UT) CAPS Take 2,000 Units by mouth daily.   Yes [provider]  diphenhydrAMINE (BENADRYL) 25 MG tablet Take 25 mg by mouth 2 (two) times daily as needed for itching.   Yes [provider]  gabapentin (NEURONTIN) 400 MG capsule Take 800 mg by mouth 2 (two) times daily. 01/07/18  Yes [provider]  hydrocortisone (ANUSOL-HC) 2.5 % rectal cream Place rectally 4 (four) times daily. 03/07/22  Yes Wieting, Richard, MD   lactulose, encephalopathy, (CHRONULAC) 10 GM/15ML SOLN Take 30 mLs (20 g total) by mouth daily. Patient taking differently: Take 15 g by mouth daily. 01/30/22  Yes Emokpae, Courage, MD  melatonin 5 MG TABS Take 10 mg by mouth at bedtime as needed (sleep).   Yes [provider]  Multiple Vitamins-Minerals (MULTIVITAMIN WITH MINERALS) tablet Take 1 tablet by mouth daily.   Yes [provider]  sertraline (ZOLOFT) 50 MG tablet Take 50 mg by mouth daily. 12/12/17  Yes [provider]  sodium chloride (OCEAN) 0.65 % SOLN nasal spray Place 1 spray into both nostrils as needed for congestion. Patient taking differently: Place 1 spray into both nostrils at bedtime. 02/10/22  Yes Wieting, Richard, MD  vitamin B-12 (CYANOCOBALAMIN) 1000 MCG tablet Take 1,000 mcg by mouth daily.   Yes [provider]  witch hazel-glycerin (TUCKS) pad Apply topically as needed for hemorrhoids. 03/07/22  Yes Loletha Grayer, MD     Vital Signs: BP (!) 116/50 (BP Location: Left Wrist)   Pulse 76   Temp 97.6 F (36.4 C) (Oral)   Resp 16   Ht 5' 9"  (1.753 m)   Wt 249 lb 11.2 oz (113.3 kg)   SpO2 98%   BMI 36.87 kg/m   Physical Exam Vitals reviewed.  Constitutional:      Appearance: Normal appearance.  Cardiovascular:     Rate and Rhythm: Normal rate.  Pulmonary:  Effort: Pulmonary effort is normal. No respiratory distress.  Neurological:     General: No focal deficit present.     Mental Status: She is alert.  Psychiatric:        Mood and Affect: Mood normal.        Behavior: Behavior normal.        Thought Content: Thought content normal.        Judgment: Judgment normal.     Imaging: IR Radiologist Eval & Mgmt  Result Date: 03/18/2022 Please refer to notes tab for details about interventional procedure. (Op Note)  US Paracentesis  Result Date: 03/18/2022 INDICATION: History of NASH cirrhosis. Recent history of tips procedure at outside facility. Recurrent  ascites. Request for therapeutic paracentesis. EXAM: ULTRASOUND GUIDED RIGHT LOWER QUADRANT PARACENTESIS MEDICATIONS: 1% plain lidocaine, 12 mL COMPLICATIONS: None immediate. PROCEDURE: Informed written consent was obtained from the patient after a discussion of the risks, benefits and alternatives to treatment. A timeout was performed prior to the initiation of the procedure. Initial ultrasound scanning demonstrates a large amount of ascites within the right lower abdominal quadrant. The right lower abdomen was prepped and draped in the usual sterile fashion. 1% lidocaine was used for local anesthesia. Following this, a 19 gauge, 7-cm, Yueh catheter was introduced. An ultrasound image was saved for documentation purposes. The paracentesis was performed. The catheter was removed and a dressing was applied. The patient tolerated the procedure well without immediate post procedural complication. FINDINGS: A total of approximately 6.8 L of clear yellow fluid was removed. IMPRESSION: Successful ultrasound-guided paracentesis yielding 6.8 liters of peritoneal fluid. Read by: Ascencion Dike PA-C Electronically Signed   By: Aletta Edouard M.D.   On: 03/18/2022 13:18    Labs:  CBC: Recent Labs    03/21/22 0120 03/21/22 0604 03/21/22 1556 03/22/22 0206  WBC 5.8 5.2 5.8 4.5  HGB 6.4* 7.4* 8.5* 7.5*  HCT 19.8* 23.2* 26.7* 22.6*  PLT 173 154 201 155    COAGS: Recent Labs    01/28/22 1707 02/04/22 1245 03/05/22 1650 03/22/22 0206  INR 1.2 1.3* 1.2 1.3*  APTT  --  31  --  30    BMP: Recent Labs    03/20/22 1307 03/21/22 0120 03/21/22 0604 03/22/22 0206  NA 135 134* 134* 134*  K 4.4 4.0 3.9 3.8  CL 108 109 108 106  CO2 21* 19* 21* 20*  GLUCOSE 200* 144* 134* 103*  BUN 39* 37* 37* 32*  CALCIUM 8.3* 7.9* 8.0* 8.0*  CREATININE 2.19* 1.95* 1.94* 1.85*  GFRNONAA 25* 28* 28* 30*    LIVER FUNCTION TESTS: Recent Labs    03/05/22 1639 03/13/22 1315 03/20/22 1307 03/22/22 0206  BILITOT  1.5* 1.8* 2.1* 3.0*  AST 42* 49* 45* 48*  ALT 24 27 25 24   ALKPHOS 111 109 104 82  PROT 4.7* 4.9* 4.5* 3.9*  ALBUMIN 2.5* 2.6* 2.4* 2.1*    Assessment and Plan:  Rectal bleeding = seems to have stopped.  Hgb 7.5, Creatinine 1.85  Ok to advance diet.  Agree with daily Miralax.  If she experiences uncontrolled frank rectal bleeding, recommend STAT CTA abdomen/pelvis (GI bleed protocol) and notify IR on call.  She understands that if CTA and/or angiography is needed, the contrast could cause nephropathy which may require hemodialysis. She is willing to take that risk for life saving measures.   Electronically Signed: Murrell Redden, PA-C 03/22/2022, 10:18 AM    I spent a total of 15 Minutes at the the patient's  bedside AND on the patient's hospital floor or unit, greater than 50% of which was counseling/coordinating care for f/u rectal bleeding.

## 2022-03-23 DIAGNOSIS — D62 Acute posthemorrhagic anemia: Secondary | ICD-10-CM | POA: Diagnosis not present

## 2022-03-23 LAB — TYPE AND SCREEN
ABO/RH(D): A POS
Antibody Screen: NEGATIVE
Unit division: 0
Unit division: 0
Unit division: 0
Unit division: 0

## 2022-03-23 LAB — CBC
HCT: 21.7 % — ABNORMAL LOW (ref 36.0–46.0)
HCT: 23.6 % — ABNORMAL LOW (ref 36.0–46.0)
Hemoglobin: 7 g/dL — ABNORMAL LOW (ref 12.0–15.0)
Hemoglobin: 7.6 g/dL — ABNORMAL LOW (ref 12.0–15.0)
MCH: 26.7 pg (ref 26.0–34.0)
MCH: 26.5 pg (ref 26.0–34.0)
MCHC: 32.3 g/dL (ref 30.0–36.0)
MCHC: 32.2 g/dL (ref 30.0–36.0)
MCV: 82.8 fL (ref 80.0–100.0)
MCV: 82.2 fL (ref 80.0–100.0)
Platelets: 131 10*3/uL — ABNORMAL LOW (ref 150–400)
Platelets: 116 10*3/uL — ABNORMAL LOW (ref 150–400)
RBC: 2.62 MIL/uL — ABNORMAL LOW (ref 3.87–5.11)
RBC: 2.87 MIL/uL — ABNORMAL LOW (ref 3.87–5.11)
RDW: 20.4 % — ABNORMAL HIGH (ref 11.5–15.5)
RDW: 20.2 % — ABNORMAL HIGH (ref 11.5–15.5)
WBC: 4 10*3/uL (ref 4.0–10.5)
WBC: 4.1 10*3/uL (ref 4.0–10.5)
nRBC: 0 % (ref 0.0–0.2)
nRBC: 0 % (ref 0.0–0.2)

## 2022-03-23 LAB — BPAM RBC
Blood Product Expiration Date: 202308282359
Blood Product Expiration Date: 202308282359
Blood Product Expiration Date: 202308282359
Blood Product Expiration Date: 202308282359
ISSUE DATE / TIME: 202308031416
ISSUE DATE / TIME: 202308031722
ISSUE DATE / TIME: 202308032055
ISSUE DATE / TIME: 202308040206
Unit Type and Rh: 6200
Unit Type and Rh: 6200
Unit Type and Rh: 6200
Unit Type and Rh: 6200

## 2022-03-23 LAB — BASIC METABOLIC PANEL
Anion gap: 5 (ref 5–15)
BUN: 31 mg/dL — ABNORMAL HIGH (ref 8–23)
CO2: 23 mmol/L (ref 22–32)
Calcium: 7.8 mg/dL — ABNORMAL LOW (ref 8.9–10.3)
Chloride: 108 mmol/L (ref 98–111)
Creatinine, Ser: 1.69 mg/dL — ABNORMAL HIGH (ref 0.44–1.00)
GFR, Estimated: 34 mL/min — ABNORMAL LOW (ref >60)
Glucose, Bld: 191 mg/dL — ABNORMAL HIGH (ref 70–99)
Potassium: 3.6 mmol/L (ref 3.5–5.1)
Sodium: 136 mmol/L (ref 135–145)

## 2022-03-23 LAB — GLUCOSE, CAPILLARY: Glucose-Capillary: 182 mg/dL — ABNORMAL HIGH (ref 70–99)

## 2022-03-23 NOTE — Plan of Care (Signed)
  Problem: Fluid Volume: Goal: Ability to maintain a balanced intake and output will improve Outcome: Progressing

## 2022-03-23 NOTE — Progress Notes (Signed)
Rachel Huber  [id number]:[id number] DOB: 1956-06-28 DOA: 03/20/2022 PCP: [name], MD    Brief Narrative:  66 year old with a history of NASH cirrhosis with portal hypertension status post TIPS May 2023 and subsequent TIPS revision/dilation, recurrent bleeding hemorrhoids status post particle and coil embolization of rectal artery 6/23-23 at [facility], weekly paracentesis, DM2, HTN, depression, thalassemia minor, and OSA on CPAP who was [facility] by [facility] hematology clinic after routine laboratory check revealed a hemoglobin of 3.9.  The patient admitted to dyspnea and fatigue and severe generalized weakness.  Hemoglobin at 3.9 was confirmed in the ER, and the patient was transfused a total of 4 units PRBC.  She was transferred to [facility] in case intervention was necessary, and IR has seen her in consultation.  Consultants:  IR  Goals of Care:  Code Status: Full Code   DVT prophylaxis: SCDs only due to active bleeding  Interim Hx: No acute events reported overnight.  Afebrile.  Vital signs stable.  Hemoglobin down slightly today but not yet at transfusion threshold.  BUN has not changed.  Had 1 self-limited episode of BRBPR today.  Assessment & Plan:  Acute blood loss anemia due to Lower GIB In the setting of internal hemorrhoids/rectal varices -hemoglobin confirmed to be 3.9 at presentation -status post 4 units PRBC total thus far -transferred to [facility] for IR evaluation as patient might require recurrent attempts at embolization if brisk bleeding resumes -appears to be stabilizing for now -hopefully she will not require any further intervention -we will continue to monitor as an inpatient for now  Cirrhosis of the liver secondary to NASH Status post TIPS and TIPS revision at [facility] where she is followed for potential transplant -requires frequent paracentesis -stable clinically at this time  HLD  Continue Lipitor as per home dose  CKD stage IIIb mild acute kidney injury  component in the setting of decreased perfusion with anemia - baseline creatinine around 1.5 w/ creatinine 2.2 at time of admission -creatinine continues to slowly improve  Thalassemia minor Continue outpatient follow-up with hematology service  DM2 Well-controlled at present  Family Communication: Spoke with patient's husband at bedside Disposition: From home -anticipate eventual discharge back home   Objective: Blood pressure (!) 129/55, pulse 89, temperature 98.4 F (36.9 C), temperature source Oral, resp. rate 16, height 5' 9"  (1.753 m), weight 113.3 kg, SpO2 98 %.  Intake/Output Summary (Last 24 hours) at 03/23/2022 1734 Last data filed at 03/22/2022 2120 Gross per 24 hour  Intake --  Output 200 ml  Net -200 ml   Filed Weights   03/20/22 1311 03/21/22 1508  Weight: 114.8 kg 113.3 kg    Examination: General: No acute respiratory distress Lungs: Clear to auscultation bilaterally without wheezes or crackles Cardiovascular: RRR without murmur or rub Abdomen: Soft, bowel sounds positive, no rebound Extremities: 1+ bilateral lower extremity edema  CBC: Recent Labs  Lab 03/20/22 1307 03/21/22 0120 03/22/22 0206 03/23/22 0329 03/23/22 1254  WBC 7.4   < > 4.5 4.0 4.1  NEUTROABS 5.0  --   --   --   --   HGB 4.0*   < > 7.5* 7.0* 7.6*  HCT 13.4*   < > 22.6* 21.7* 23.6*  MCV 78.4*   < > 80.7 82.8 82.2  PLT 211   < > 155 131* 116*   < > = values in this interval not displayed.   Basic Metabolic Panel: Recent Labs  Lab 03/21/22 0604 03/22/22  0206 03/23/22 0329  NA 134* 134* 136  K 3.9 3.8 3.6  CL 108 106 108  CO2 21* 20* 23  GLUCOSE 134* 103* 191*  BUN 37* 32* 31*  CREATININE 1.94* 1.85* 1.69*  CALCIUM 8.0* 8.0* 7.8*   GFR: Estimated Creatinine Clearance: 45.1 mL/min (A) (by C-G formula based on SCr of 1.69 mg/dL (H)).  Liver Function Tests: Recent Labs  Lab 03/20/22 1307 03/22/22 0206  AST 45* 48*  ALT 25 24  ALKPHOS 104 82  BILITOT 2.1* 3.0*  PROT  4.5* 3.9*  ALBUMIN 2.4* 2.1*   Coagulation Profile: Recent Labs  Lab 03/22/22 0206  INR 1.3*   HbA1C: Hgb A1c MFr Bld  Date/Time Value Ref Range Status  01/28/2022 05:07 AM 5.7 (H) 4.8 - 5.6 % Final    Comment:    (NOTE) Pre diabetes:          5.7%-6.4%  Diabetes:              >6.4%  Glycemic control for   <7.0% adults with diabetes   09/03/2020 04:41 AM 6.9 (H) 4.8 - 5.6 % Final    Comment:    (NOTE) Pre diabetes:          5.7%-6.4%  Diabetes:              >6.4%  Glycemic control for   <7.0% adults with diabetes     Scheduled Meds:  atorvastatin  10 mg Oral Daily   cholecalciferol  2,000 Units Oral Daily   cyanocobalamin  1,000 mcg Oral Daily   hydrocortisone   Rectal QID   hydrocortisone  25 mg Rectal BID   lactulose  20 g Oral Daily   multivitamin with minerals  1 tablet Oral Daily   polyethylene glycol  17 g Oral BID   sertraline  50 mg Oral Daily     LOS: 3 days   Cherene Altes, MD [facility] Office  [phone] Pager - Text Page per Shea Evans  If 7PM-7AM, please contact night-coverage per Amion 03/23/2022, 5:34 PM

## 2022-03-24 DIAGNOSIS — D62 Acute posthemorrhagic anemia: Secondary | ICD-10-CM | POA: Diagnosis not present

## 2022-03-24 LAB — CBC
HCT: 19.8 % — ABNORMAL LOW (ref 36.0–46.0)
HCT: 21.1 % — ABNORMAL LOW (ref 36.0–46.0)
HCT: 28.4 % — ABNORMAL LOW (ref 36.0–46.0)
Hemoglobin: 6.4 g/dL — CL (ref 12.0–15.0)
Hemoglobin: 6.7 g/dL — CL (ref 12.0–15.0)
Hemoglobin: 9.6 g/dL — ABNORMAL LOW (ref 12.0–15.0)
MCH: 26.6 pg (ref 26.0–34.0)
MCH: 26.3 pg (ref 26.0–34.0)
MCH: 27.4 pg (ref 26.0–34.0)
MCHC: 32.3 g/dL (ref 30.0–36.0)
MCHC: 31.8 g/dL (ref 30.0–36.0)
MCHC: 33.8 g/dL (ref 30.0–36.0)
MCV: 82.2 fL (ref 80.0–100.0)
MCV: 82.7 fL (ref 80.0–100.0)
MCV: 80.9 fL (ref 80.0–100.0)
Platelets: 173 10*3/uL (ref 150–400)
Platelets: 112 10*3/uL — ABNORMAL LOW (ref 150–400)
Platelets: 118 10*3/uL — ABNORMAL LOW (ref 150–400)
RBC: 2.41 MIL/uL — ABNORMAL LOW (ref 3.87–5.11)
RBC: 2.55 MIL/uL — ABNORMAL LOW (ref 3.87–5.11)
RBC: 3.51 MIL/uL — ABNORMAL LOW (ref 3.87–5.11)
RDW: 20.6 % — ABNORMAL HIGH (ref 11.5–15.5)
RDW: 20.2 % — ABNORMAL HIGH (ref 11.5–15.5)
RDW: 19.1 % — ABNORMAL HIGH (ref 11.5–15.5)
WBC: 5.8 10*3/uL (ref 4.0–10.5)
WBC: 3.5 10*3/uL — ABNORMAL LOW (ref 4.0–10.5)
WBC: 4 10*3/uL (ref 4.0–10.5)
nRBC: 0.3 % — ABNORMAL HIGH (ref 0.0–0.2)
nRBC: 0 % (ref 0.0–0.2)
nRBC: 0 % (ref 0.0–0.2)

## 2022-03-24 LAB — BASIC METABOLIC PANEL
Anion gap: 7 (ref 5–15)
BUN: 28 mg/dL — ABNORMAL HIGH (ref 8–23)
CO2: 22 mmol/L (ref 22–32)
Calcium: 7.6 mg/dL — ABNORMAL LOW (ref 8.9–10.3)
Chloride: 109 mmol/L (ref 98–111)
Creatinine, Ser: 1.43 mg/dL — ABNORMAL HIGH (ref 0.44–1.00)
GFR, Estimated: 41 mL/min — ABNORMAL LOW (ref >60)
Glucose, Bld: 199 mg/dL — ABNORMAL HIGH (ref 70–99)
Potassium: 3.3 mmol/L — ABNORMAL LOW (ref 3.5–5.1)
Sodium: 138 mmol/L (ref 135–145)

## 2022-03-24 LAB — PREPARE RBC (CROSSMATCH)

## 2022-03-24 MED ORDER — PHYTONADIONE 5 MG PO TABS
5.0000 mg | ORAL_TABLET | Freq: Every day | ORAL | Status: AC
Start: 2022-03-24 — End: 2022-03-26
  Administered 2022-03-24 – 2022-03-26 (×3): 5 mg via ORAL
  Filled 2022-03-24 (×4): qty 1

## 2022-03-24 MED ORDER — SODIUM CHLORIDE 0.9% IV SOLUTION
Freq: Once | INTRAVENOUS | Status: AC
Start: 2022-03-24 — End: 2022-03-24

## 2022-03-24 NOTE — Progress Notes (Signed)
PT Cancellation Note  Patient Details Name: Rachel Huber MRN: [id number] DOB: 23-Jul-1956   Cancelled Treatment:    Reason Eval/Treat Not Completed: Other (comment). Pt reports she is trying to keep bleeding/discomfort down and getting tends to exacerbate. Pt reports she has been getting up to the bathroom with nursing. Agreed to return tomorrow.    Shary Decamp [facility] 03/24/2022, 1:27 PM [place] Office [phone]

## 2022-03-24 NOTE — Progress Notes (Signed)
Rachel Huber  TUU:[id number] DOB: [birth year] DOA: 03/20/2022 PCP: [name], MD    Brief Narrative:  66 year old with a history of NASH cirrhosis with portal hypertension status post TIPS May 2023 and subsequent TIPS revision/dilation, recurrent bleeding hemorrhoids status post particle and coil embolization of rectal artery 6/23-23 at [facility], weekly paracentesis, DM2, HTN, depression, thalassemia minor, and OSA on CPAP who was [facility] by her hematology clinic after routine laboratory check revealed a hemoglobin of 3.9.  The patient admitted to dyspnea and fatigue and severe generalized weakness.  Hemoglobin at 3.9 was confirmed in the ER, and the patient was transfused a total of 4 units PRBC.  She was transferred to [facility] in case intervention was necessary, and IR has seen her in consultation.  Consultants:  IR  Goals of Care:  Code Status: Full Code   DVT prophylaxis: SCDs only due to active bleeding  Interim Hx: The patient's hemoglobin was noted to have dropped to 6.7 this morning.  She however has not noticed any significant increase in obvious bleeding reporting actually to brown regular bowel movements over the last 24 hours.  She denies fevers chills nausea vomiting or abdominal pain.  She does feel that her abdomen is beginning to become distended to the point at which she normally gets paracentesis.  This is typically done every Tuesday.  Assessment & Plan:  Acute blood loss anemia due to Lower GIB In the setting of internal hemorrhoids/rectal varices -hemoglobin confirmed to be 3.9 at presentation -status post 5 units PRBC total thus far -transferred to [facility] for IR evaluation as patient might require recurrent attempt at embolization if brisk bleeding resumes -appears to be stabilizing with only minimal occasional BRBPR, but hemoglobin has dropped significantly this morning -nonetheless clinically the history is not consistent with active bleeding and  therefore I plan to continue with conservative observation and close hemoglobin monitoring for now  Cirrhosis of the liver secondary to NASH Status post TIPS and TIPS revision at [facility] where she is followed for potential transplant -requires frequent paracentesis -stable clinically at this time -due for her usual paracentesis which I will order for tomorrow  HLD  Continue Lipitor as per home dose  Hypokalemia Supplement and follow  CKD stage IIIb mild acute kidney injury component in the setting of decreased perfusion with anemia - baseline creatinine around 1.5 w/ creatinine 2.2 at time of admission -creatinine is now at her baseline  Thalassemia minor Continue outpatient follow-up with hematology service  DM2 Well-controlled at present  Family Communication: No family present at time of exam today Disposition: From home -anticipate eventual discharge back home   Objective: Blood pressure 114/65, pulse 83, temperature 98 F (36.7 C), temperature source Oral, resp. rate 18, height 5' 9"  (1.753 m), weight 113.3 kg, SpO2 98 %.  Intake/Output Summary (Last 24 hours) at 03/24/2022 0922 Last data filed at 03/24/2022 0529 Gross per 24 hour  Intake 810 ml  Output --  Net 810 ml    Filed Weights   03/20/22 1311 03/21/22 1508  Weight: 114.8 kg 113.3 kg    Examination: General: No acute respiratory distress Lungs: Clear to auscultation bilaterally -no wheezing Cardiovascular: RRR without murmur or rub Abdomen: Soft, bowel sounds positive, no rebound -distended with exam consistent with ascites Extremities: 1+ bilateral lower extremity edema without change  CBC: Recent Labs  Lab 03/20/22 1307 03/21/22 0120 03/23/22 0329 03/23/22 1254 03/24/22 0319  WBC 7.4   < >  4.0 4.1 3.5*  NEUTROABS 5.0  --   --   --   --   HGB 4.0*   < > 7.0* 7.6* 6.7*  HCT 13.4*   < > 21.7* 23.6* 21.1*  MCV 78.4*   < > 82.8 82.2 82.7  PLT 211   < > 131* 116* 112*   < > = values in this interval not  displayed.    Basic Metabolic Panel: Recent Labs  Lab 03/22/22 0206 03/23/22 0329 03/24/22 0319  NA 134* 136 138  K 3.8 3.6 3.3*  CL 106 108 109  CO2 20* 23 22  GLUCOSE 103* 191* 199*  BUN 32* 31* 28*  CREATININE 1.85* 1.69* 1.43*  CALCIUM 8.0* 7.8* 7.6*    GFR: Estimated Creatinine Clearance: 53.3 mL/min (A) (by C-G formula based on SCr of 1.43 mg/dL (H)).  Liver Function Tests: Recent Labs  Lab 03/20/22 1307 03/22/22 0206  AST 45* 48*  ALT 25 24  ALKPHOS 104 82  BILITOT 2.1* 3.0*  PROT 4.5* 3.9*  ALBUMIN 2.4* 2.1*    Coagulation Profile: Recent Labs  Lab 03/22/22 0206  INR 1.3*    HbA1C: Hgb A1c MFr Bld  Date/Time Value Ref Range Status  01/28/2022 05:07 AM 5.7 (H) 4.8 - 5.6 % Final    Comment:    (NOTE) Pre diabetes:          5.7%-6.4%  Diabetes:              >6.4%  Glycemic control for   <7.0% adults with diabetes   09/03/2020 04:41 AM 6.9 (H) 4.8 - 5.6 % Final    Comment:    (NOTE) Pre diabetes:          5.7%-6.4%  Diabetes:              >6.4%  Glycemic control for   <7.0% adults with diabetes     Scheduled Meds:  atorvastatin  10 mg Oral Daily   cholecalciferol  2,000 Units Oral Daily   cyanocobalamin  1,000 mcg Oral Daily   hydrocortisone   Rectal QID   hydrocortisone  25 mg Rectal BID   lactulose  20 g Oral Daily   multivitamin with minerals  1 tablet Oral Daily   polyethylene glycol  17 g Oral BID   sertraline  50 mg Oral Daily     LOS: 4 days   Cherene Altes, MD [facility] Office  [phone] Pager - Text Page per Shea Evans  If 7PM-7AM, please contact night-coverage per Amion 03/24/2022, 9:22 AM

## 2022-03-25 ENCOUNTER — Ambulatory Visit: Admission: RE | Admit: 2022-03-25 | Payer: 59 | Source: Ambulatory Visit

## 2022-03-25 ENCOUNTER — Inpatient Hospital Stay (HOSPITAL_COMMUNITY): Payer: 59

## 2022-03-25 DIAGNOSIS — D62 Acute posthemorrhagic anemia: Secondary | ICD-10-CM | POA: Diagnosis not present

## 2022-03-25 HISTORY — PX: IR PARACENTESIS: IMG2679

## 2022-03-25 LAB — COMPREHENSIVE METABOLIC PANEL
ALT: 19 U/L (ref 0–44)
AST: 37 U/L (ref 15–41)
Albumin: 1.9 g/dL — ABNORMAL LOW (ref 3.5–5.0)
Alkaline Phosphatase: 70 U/L (ref 38–126)
Anion gap: 6 (ref 5–15)
BUN: 25 mg/dL — ABNORMAL HIGH (ref 8–23)
CO2: 22 mmol/L (ref 22–32)
Calcium: 8.3 mg/dL — ABNORMAL LOW (ref 8.9–10.3)
Chloride: 111 mmol/L (ref 98–111)
Creatinine, Ser: 1.29 mg/dL — ABNORMAL HIGH (ref 0.44–1.00)
GFR, Estimated: 46 mL/min — ABNORMAL LOW (ref >60)
Glucose, Bld: 144 mg/dL — ABNORMAL HIGH (ref 70–99)
Potassium: 3.4 mmol/L — ABNORMAL LOW (ref 3.5–5.1)
Sodium: 139 mmol/L (ref 135–145)
Total Bilirubin: 1.9 mg/dL — ABNORMAL HIGH (ref 0.3–1.2)
Total Protein: 3.9 g/dL — ABNORMAL LOW (ref 6.5–8.1)

## 2022-03-25 LAB — CBC
HCT: 24.8 % — ABNORMAL LOW (ref 36.0–46.0)
Hemoglobin: 8 g/dL — ABNORMAL LOW (ref 12.0–15.0)
MCH: 27.1 pg (ref 26.0–34.0)
MCHC: 32.3 g/dL (ref 30.0–36.0)
MCV: 84.1 fL (ref 80.0–100.0)
Platelets: 114 10*3/uL — ABNORMAL LOW (ref 150–400)
RBC: 2.95 MIL/uL — ABNORMAL LOW (ref 3.87–5.11)
RDW: 19 % — ABNORMAL HIGH (ref 11.5–15.5)
WBC: 3.7 10*3/uL — ABNORMAL LOW (ref 4.0–10.5)
nRBC: 0 % (ref 0.0–0.2)

## 2022-03-25 LAB — TYPE AND SCREEN
ABO/RH(D): A POS
Antibody Screen: NEGATIVE
Unit division: 0

## 2022-03-25 LAB — BPAM RBC
Blood Product Expiration Date: 202308282359
ISSUE DATE / TIME: 202308070517
Unit Type and Rh: 6200

## 2022-03-25 MED ORDER — LIDOCAINE HCL 1 % IJ SOLN
INTRAMUSCULAR | Status: AC
  Filled 2022-03-25: qty 20

## 2022-03-25 MED ORDER — POTASSIUM CHLORIDE CRYS ER 20 MEQ PO TBCR
20.0000 meq | EXTENDED_RELEASE_TABLET | Freq: Every day | ORAL | Status: DC
  Administered 2022-03-25 – 2022-03-28 (×4): 20 meq via ORAL
  Filled 2022-03-25 (×5): qty 1

## 2022-03-25 MED ORDER — LIDOCAINE HCL 1 % IJ SOLN
INTRAMUSCULAR | Status: DC | PRN
  Administered 2022-03-25: 20 mL via INTRADERMAL

## 2022-03-25 NOTE — Consult Note (Addendum)
   [facility] CM Inpatient Consult   03/25/2022  Rachel Huber 03/26/1956 [id number]  [place] [facility] [ACO] Patient: [employer]  Primary Care Provider:  [name], MD, [facility]  Addendum:  03/26/22 1535 Met with patient at the bedside, explained reason for visit for post hospital follow up needs. SDOH reviewed and are up-to-date. Explained that post hospital call for transition of care. Gave patient an appointment reminder card for PCP follow up and she endorses provider, placed card in her duffle bag side pocket as requested.  Patient screened for less than 30 days readmission hospitalization with noted high risk score for unplanned readmission risk and  to assess for readmission prevention needs with potential [employer] service needs for post hospital transition.    Plan:  Continue to follow progress and disposition to assess for post hospital care management needs.  To refer for high risk follow up needs  For questions contact:   Natividad Brood, RN BSN [facility] Liaison  [phone] business mobile phone Toll free office [phone]  Fax number: [phone] [email or web address]

## 2022-03-25 NOTE — Progress Notes (Signed)
Rachel Huber  WCH:[id number] DOB: 1956/04/14 DOA: 03/20/2022 PCP: [name], MD    Brief Narrative:  66 year old with a history of NASH cirrhosis with portal hypertension status post TIPS May 2023 and subsequent TIPS revision/dilation, recurrent bleeding hemorrhoids status post particle and coil embolization of rectal artery 6/23-23 at [facility], weekly paracentesis, DM2, HTN, depression, thalassemia minor, and OSA on CPAP who was sent to [facility] by her hematology clinic after routine laboratory check revealed a hemoglobin of 3.9.  The patient admitted to dyspnea and fatigue and severe generalized weakness.  Hemoglobin at 3.9 was confirmed in the ER, and the patient was transfused a total of 4 units PRBC.  She was transferred to [facility] in case intervention was necessary, and IR has seen her in consultation.  Consultants:  IR  Goals of Care:  Code Status: Full Code   DVT prophylaxis: SCDs only due to active bleeding  Interim Hx: Underwent scheduled paracentesis today yielding 3.7 L of fluid.  Afebrile.  Vital signs stable.  Hemoglobin continues to wax and wane.  Patient confirms to me again today that she has not had any blood at all in her bowel movements for the last 48 hours.  This is a significant improvement for her.  She tolerated her paracentesis well and reports a decrease in volume present this time which she views as encouraging news following revision of her TIPS recently.  Assessment & Plan:  Acute blood loss anemia due to Lower GIB In the setting of internal hemorrhoids/rectal varices -hemoglobin confirmed to be 3.9 at presentation -status post 5 units PRBC total thus far -transferred to [facility] for IR evaluation as patient might require recurrent attempt at embolization if brisk bleeding resumes -appeared to be stabilizing with only minimal occasional BRBPR, but hemoglobin dropped 8/7 AM -nonetheless clinically the history was not consistent with active bleeding and  therefore conservative observation with close hemoglobin monitoring continues - last transfusion was 1U 8/7 for a total of 5U thus far -denies any blood in stool for the last 48 hours -hoping to avoid any other aggressive interventions during this hospital stay if hemoglobin will remain relatively stable  Cirrhosis of the liver secondary to NASH Status post TIPS and TIPS revision at [facility] where she is followed for potential transplant -requires frequent paracentesis -stable clinically at this time -due for her usual paracentesis today which was completed w/o incident   HLD  Continue Lipitor as per home dose  Hypokalemia Cont to supplement and follow  AKI on CKD stage IIIb mild acute kidney injury component in the setting of decreased perfusion with anemia as evidenced by baseline creatinine around 1.5 w/ creatinine 2.2 at time of admission - creatinine has now improved to her baseline  Thalassemia minor Continue outpatient follow-up with hematology service  DM2 Well-controlled at present  Family Communication: No family present at time of exam today Disposition: From home -anticipate eventual discharge back home when hemoglobin stable consistently   Objective: Blood pressure (!) 115/58, pulse 75, temperature 98.2 F (36.8 C), temperature source Oral, resp. rate 18, height 5' 9"  (1.753 m), weight 113.3 kg, SpO2 97 %.  Intake/Output Summary (Last 24 hours) at 03/25/2022 1006 Last data filed at 03/24/2022 2025 Gross per 24 hour  Intake --  Output 900 ml  Net -900 ml    Filed Weights   03/20/22 1311 03/21/22 1508  Weight: 114.8 kg 113.3 kg    Examination: General: No acute respiratory distress Lungs: Clear to  auscultation bilaterally -no wheezing Cardiovascular: RRR without murmur or rub Abdomen: Obese, soft, bowel sounds positive, no appreciable ascites today Extremities: 1+ bilateral lower extremity edema without change  CBC: Recent Labs  Lab 03/20/22 1307 03/21/22 0120  03/24/22 0319 03/24/22 1231 03/25/22 0354  WBC 7.4   < > 3.5* 4.0 3.7*  NEUTROABS 5.0  --   --   --   --   HGB 4.0*   < > 6.7* 9.6* 8.0*  HCT 13.4*   < > 21.1* 28.4* 24.8*  MCV 78.4*   < > 82.7 80.9 84.1  PLT 211   < > 112* 118* 114*   < > = values in this interval not displayed.    Basic Metabolic Panel: Recent Labs  Lab 03/23/22 0329 03/24/22 0319 03/25/22 0354  NA 136 138 139  K 3.6 3.3* 3.4*  CL 108 109 111  CO2 23 22 22   GLUCOSE 191* 199* 144*  BUN 31* 28* 25*  CREATININE 1.69* 1.43* 1.29*  CALCIUM 7.8* 7.6* 8.3*    GFR: Estimated Creatinine Clearance: 59.1 mL/min (A) (by C-G formula based on SCr of 1.29 mg/dL (H)).  Liver Function Tests: Recent Labs  Lab 03/20/22 1307 03/22/22 0206 03/25/22 0354  AST 45* 48* 37  ALT 25 24 19   ALKPHOS 104 82 70  BILITOT 2.1* 3.0* 1.9*  PROT 4.5* 3.9* 3.9*  ALBUMIN 2.4* 2.1* 1.9*    Coagulation Profile: Recent Labs  Lab 03/22/22 0206  INR 1.3*    HbA1C: Hgb A1c MFr Bld  Date/Time Value Ref Range Status  01/28/2022 05:07 AM 5.7 (H) 4.8 - 5.6 % Final    Comment:    (NOTE) Pre diabetes:          5.7%-6.4%  Diabetes:              >6.4%  Glycemic control for   <7.0% adults with diabetes   09/03/2020 04:41 AM 6.9 (H) 4.8 - 5.6 % Final    Comment:    (NOTE) Pre diabetes:          5.7%-6.4%  Diabetes:              >6.4%  Glycemic control for   <7.0% adults with diabetes     Scheduled Meds:  atorvastatin  10 mg Oral Daily   cholecalciferol  2,000 Units Oral Daily   cyanocobalamin  1,000 mcg Oral Daily   hydrocortisone   Rectal QID   hydrocortisone  25 mg Rectal BID   lactulose  20 g Oral Daily   multivitamin with minerals  1 tablet Oral Daily   phytonadione  5 mg Oral Daily   polyethylene glycol  17 g Oral BID   sertraline  50 mg Oral Daily     LOS: 5 days   Cherene Altes, MD [facility] Office  [phone] Pager - Text Page per Shea Evans  If 7PM-7AM, please contact night-coverage  per Amion 03/25/2022, 10:06 AM

## 2022-03-25 NOTE — Procedures (Signed)
Ultrasound-guided therapeutic paracentesis performed yielding 3.7 liters of straw colored fluid.. No immediate complications. EBL is none.
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
## 2022-03-26 DIAGNOSIS — D649 Anemia, unspecified: Secondary | ICD-10-CM | POA: Diagnosis not present

## 2022-03-26 DIAGNOSIS — N1832 Chronic kidney disease, stage 3b: Secondary | ICD-10-CM | POA: Diagnosis not present

## 2022-03-26 DIAGNOSIS — K7581 Nonalcoholic steatohepatitis (NASH): Secondary | ICD-10-CM | POA: Diagnosis not present

## 2022-03-26 DIAGNOSIS — D62 Acute posthemorrhagic anemia: Secondary | ICD-10-CM | POA: Diagnosis not present

## 2022-03-26 LAB — COMPREHENSIVE METABOLIC PANEL
ALT: 20 U/L (ref 0–44)
AST: 37 U/L (ref 15–41)
Albumin: 1.9 g/dL — ABNORMAL LOW (ref 3.5–5.0)
Alkaline Phosphatase: 75 U/L (ref 38–126)
Anion gap: 4 — ABNORMAL LOW (ref 5–15)
BUN: 25 mg/dL — ABNORMAL HIGH (ref 8–23)
CO2: 23 mmol/L (ref 22–32)
Calcium: 8 mg/dL — ABNORMAL LOW (ref 8.9–10.3)
Chloride: 111 mmol/L (ref 98–111)
Creatinine, Ser: 1.19 mg/dL — ABNORMAL HIGH (ref 0.44–1.00)
GFR, Estimated: 51 mL/min — ABNORMAL LOW (ref >60)
Glucose, Bld: 160 mg/dL — ABNORMAL HIGH (ref 70–99)
Potassium: 3.5 mmol/L (ref 3.5–5.1)
Sodium: 138 mmol/L (ref 135–145)
Total Bilirubin: 1.7 mg/dL — ABNORMAL HIGH (ref 0.3–1.2)
Total Protein: 4 g/dL — ABNORMAL LOW (ref 6.5–8.1)

## 2022-03-26 LAB — CBC
HCT: 23.7 % — ABNORMAL LOW (ref 36.0–46.0)
Hemoglobin: 7.8 g/dL — ABNORMAL LOW (ref 12.0–15.0)
MCH: 27.4 pg (ref 26.0–34.0)
MCHC: 32.9 g/dL (ref 30.0–36.0)
MCV: 83.2 fL (ref 80.0–100.0)
Platelets: 104 10*3/uL — ABNORMAL LOW (ref 150–400)
RBC: 2.85 MIL/uL — ABNORMAL LOW (ref 3.87–5.11)
RDW: 19.1 % — ABNORMAL HIGH (ref 11.5–15.5)
WBC: 4 10*3/uL (ref 4.0–10.5)
nRBC: 0 % (ref 0.0–0.2)

## 2022-03-26 NOTE — Progress Notes (Signed)
[facility] PROGRESS NOTE    Progress Note  Rachel Huber  XEN:[id number] DOB: 1955/12/29 DOA: 03/20/2022 PCP: [name], MD     Brief Narrative:   Rachel Huber is an 66 y.o. female past medical history of [name] cirrhosis with portal hypertension status post TIPS in May 2023 and subsequent TIPS revision and dilation, recurrent hemorrhoidal bleeding status post coil embolization of rectal arteries on 02/07/2022 at [facility], weekly paracentesis, diabetes mellitus type 2 obstructive sleep apnea on CPAP went to the ER as she was referred by her hematologist for a hemoglobin of 4 at [facility], transferred to [facility] in case intervention was necessary, she has been transfused 4 units of packed red blood cells.  IR has seen her in consultation.  She underwent paracentesis on 03/25/2022 yielded 4 L of fluid has remained afebrile hemoglobin continues to wax and wane.  He denies any bloody stools or melanotic stools.    Assessment/Plan:   Acute blood loss anemia lower  GI bleeding Status post 5 units of packed red blood cells.  Hemoglobin this morning is 7.8. Consulted due to concerns that she might have brisk bleeding requiring recurrent embolization.  She appears to be stable with only minimal occasional bright red blood per rectum her hemoglobin has remained relatively stable after transfusions. GI evaluated the patient and relates that is likely related to recurrent hemorrhoids and recommended formal evaluation by interventional radiology for possible embolization.  Has she has had multiple lower scope without any identifiable source. She has been treated conservatively with observation last transfusion was in 03/24/2022 and has not had any evidence of bleeding in the last 72 hours. Physical therapy evaluated the patient recommended NO  home health PT.  Liver cirrhosis secondary to NASH [facility]) With a history of TIPS and revision at [facility] is being followed for potential  transplant. She does require frequent paracentesis. Likely stable at this time her last paracentesis was on 03/25/2022 yielded 3.7 L of fluid.  HLD (hyperlipidemia) Continue statins.  Hypokalemia: Supplement orally and continue to monitor.  Acute kidney injury on chronic kidney disease stage IIIb: Mild component due to decreased perfusion in the setting of anemia with a baseline creatinine around 1.5 on admission 2.2. Improved with fluid resuscitation.  Diabetes mellitus type 2: fairly controlled without any insulin.   DVT prophylaxis: SCD Family Communication:none Status is: Inpatient Remains inpatient appropriate because: Chronic GI bleed    Code Status:     Code Status Orders  (From admission, onward)           Start     Ordered   03/20/22 2024  Full code  Continuous        03/20/22 2024           Code Status History     Date Active Date Inactive Code Status Order ID Comments User Context   03/05/2022 2106 03/07/2022 2101 Full Code [id number]  Athena Masse, MD ED   02/04/2022 1426 02/10/2022 1837 Full Code [id number]  Ivor Costa, MD ED   01/27/2022 2249 01/30/2022 2232 Full Code [id number]  Vado, [place], DO Inpatient   10/25/2020 2151 10/30/2020 1631 Full Code [id number]  Orene Desanctis, DO ED   10/08/2020 1439 10/11/2020 2211 Full Code [id number]  Collier Bullock, MD ED   09/02/2020 0957 09/13/2020 1925 Full Code [id number]  Ivor Costa, MD ED         IV Access:   Peripheral IV   Procedures and diagnostic  studies:   IR Paracentesis  Result Date: 03/25/2022 INDICATION: [name] cirrhosis status post tips procedure at outside facility with recurrent ascites. Request is for therapeutic paracentesis EXAM: ULTRASOUND GUIDED THERAPEUTIC PARACENTESIS MEDICATIONS: Lidocaine 1% 15 mL COMPLICATIONS: None immediate. PROCEDURE: Informed written consent was obtained from the patient after a discussion of the risks, benefits and alternatives to treatment. A timeout was  performed prior to the initiation of the procedure. Initial ultrasound scanning demonstrates a moderate amount of ascites within the right lower abdominal quadrant. The right lower abdomen was prepped and draped in the usual sterile fashion. 1% lidocaine was used for local anesthesia. Following this, a 19 gauge, 7-cm, Yueh catheter was introduced. An ultrasound image was saved for documentation purposes. The paracentesis was performed. The catheter was removed and a dressing was applied. The patient tolerated the procedure well without immediate post procedural complication. FINDINGS: A total of approximately 3.7 of straw-colored fluid was removed. IMPRESSION: Successful ultrasound-guided therapeutic paracentesis yielding 3.7 liters of peritoneal fluid. Read by: Rushie Nyhan, NP Electronically Signed   By: Miachel Roux M.D.   On: 03/25/2022 10:10     Medical Consultants:   None.   Subjective:    Rachel Huber denies any bright red blood per rectum or melanotic stools.  Tolerating her diet having regular bowel movements.  Objective:    Vitals:   03/25/22 0849 03/25/22 1208 03/25/22 2121 03/26/22 0544  BP: (!) 115/58 (!) 117/49 (!) 120/52 (!) 112/46  Pulse:  84 80 79  Resp:  14 18 16   Temp:  98.2 F (36.8 C) 98.4 F (36.9 C) 98.4 F (36.9 C)  TempSrc:  Oral Oral Oral  SpO2:  100% 98% 96%  Weight:      Height:       SpO2: 96 %   Intake/Output Summary (Last 24 hours) at 03/26/2022 1039 Last data filed at 03/26/2022 0641 Gross per 24 hour  Intake 600 ml  Output 750 ml  Net -150 ml   Filed Weights   03/20/22 1311 03/21/22 1508  Weight: 114.8 kg 113.3 kg    Exam: General exam: In no acute distress. Respiratory system: Good air movement and clear to auscultation. Cardiovascular system: S1 & S2 heard, RRR. No JVD. Gastrointestinal system: Abdomen is nondistended, soft and nontender.  Extremities: No pedal edema. Skin: No rashes, lesions or ulcers Psychiatry: Judgement  and insight appear normal. Mood & affect appropriate.    Data Reviewed:    Labs: Basic Metabolic Panel: Recent Labs  Lab 03/22/22 0206 03/23/22 0329 03/24/22 0319 03/25/22 0354 03/26/22 0313  NA 134* 136 138 139 138  K 3.8 3.6 3.3* 3.4* 3.5  CL 106 108 109 111 111  CO2 20* 23 22 22 23   GLUCOSE 103* 191* 199* 144* 160*  BUN 32* 31* 28* 25* 25*  CREATININE 1.85* 1.69* 1.43* 1.29* 1.19*  CALCIUM 8.0* 7.8* 7.6* 8.3* 8.0*   GFR Estimated Creatinine Clearance: 64.1 mL/min (A) (by C-G formula based on SCr of 1.19 mg/dL (H)). Liver Function Tests: Recent Labs  Lab 03/20/22 1307 03/22/22 0206 03/25/22 0354 03/26/22 0313  AST 45* 48* 37 37  ALT 25 24 19 20   ALKPHOS 104 82 70 75  BILITOT 2.1* 3.0* 1.9* 1.7*  PROT 4.5* 3.9* 3.9* 4.0*  ALBUMIN 2.4* 2.1* 1.9* 1.9*   No results for input(s): "LIPASE", "AMYLASE" in the last 168 hours. No results for input(s): "AMMONIA" in the last 168 hours. Coagulation profile Recent Labs  Lab 03/22/22 0206  INR 1.3*  COVID-19 Labs  No results for input(s): "DDIMER", "FERRITIN", "LDH", "CRP" in the last 72 hours.  Lab Results  Component Value Date   SARSCOV2NAA POSITIVE (A) 10/08/2020   SARSCOV2NAA POSITIVE (A) 09/02/2020    CBC: Recent Labs  Lab 03/20/22 1307 03/21/22 0120 03/23/22 1254 03/24/22 0319 03/24/22 1231 03/25/22 0354 03/26/22 0313  WBC 7.4   < > 4.1 3.5* 4.0 3.7* 4.0  NEUTROABS 5.0  --   --   --   --   --   --   HGB 4.0*   < > 7.6* 6.7* 9.6* 8.0* 7.8*  HCT 13.4*   < > 23.6* 21.1* 28.4* 24.8* 23.7*  MCV 78.4*   < > 82.2 82.7 80.9 84.1 83.2  PLT 211   < > 116* 112* 118* 114* 104*   < > = values in this interval not displayed.   Cardiac Enzymes: No results for input(s): "CKTOTAL", "CKMB", "CKMBINDEX", "TROPONINI" in the last 168 hours. BNP (last 3 results) No results for input(s): "PROBNP" in the last 8760 hours. CBG: Recent Labs  Lab 03/22/22 0409 03/22/22 0916 03/22/22 1208 03/22/22 1634  03/23/22 2041  GLUCAP 102* 93 97 164* 182*   D-Dimer: No results for input(s): "DDIMER" in the last 72 hours. Hgb A1c: No results for input(s): "HGBA1C" in the last 72 hours. Lipid Profile: No results for input(s): "CHOL", "HDL", "LDLCALC", "TRIG", "CHOLHDL", "LDLDIRECT" in the last 72 hours. Thyroid function studies: No results for input(s): "TSH", "T4TOTAL", "T3FREE", "THYROIDAB" in the last 72 hours.  Invalid input(s): "FREET3" Anemia work up: No results for input(s): "VITAMINB12", "FOLATE", "FERRITIN", "TIBC", "IRON", "RETICCTPCT" in the last 72 hours. Sepsis Labs: Recent Labs  Lab 03/24/22 0319 03/24/22 1231 03/25/22 0354 03/26/22 0313  WBC 3.5* 4.0 3.7* 4.0   Microbiology No results found for this or any previous visit (from the past 240 hour(s)).   Medications:    atorvastatin  10 mg Oral Daily   cholecalciferol  2,000 Units Oral Daily   cyanocobalamin  1,000 mcg Oral Daily   hydrocortisone   Rectal QID   hydrocortisone  25 mg Rectal BID   lactulose  20 g Oral Daily   multivitamin with minerals  1 tablet Oral Daily   phytonadione  5 mg Oral Daily   polyethylene glycol  17 g Oral BID   potassium chloride  20 mEq Oral Daily   sertraline  50 mg Oral Daily   Continuous Infusions:    LOS: 6 days   Charlynne Cousins  [facility]  03/26/2022, 10:39 AM

## 2022-03-26 NOTE — TOC Initial Note (Signed)
Transition of Care [facility]) - Initial/Assessment Note    Patient Details  Name: Rachel Huber MRN: [id number] Date of Birth: Apr 27, 1956  Transition of Care [facility]) CM/SW Contact:    Bethena Roys, RN Phone Number: 03/26/2022, 2:42 PM  Clinical Narrative:  Patient discussed in progression rounds. Patient presented with fatigue and general weakness. Hgb found to be 3.9 on admission-total of 5 units PRBCs'. Patient has NASH Cirrhosis. PTA patient was from home. Case Manager will continue to follow for disposition needs as the patient progresses.            Expected Discharge Plan: Home/Self Care Barriers to Discharge: Continued Medical Work up   Expected Discharge Plan and Services Expected Discharge Plan: Home/Self Care In-house Referral: NA Discharge Planning Services: CM Consult   Living arrangements for the past 2 months: Single Family Home                   DME Agency: NA    Prior Living Arrangements/Services Living arrangements for the past 2 months: Single Family Home Lives with:: Spouse Patient language and need for interpreter reviewed:: Yes        Need for Family Participation in Patient Care: Yes (Comment) Care giver support system in place?: Yes (comment)   Criminal Activity/Legal Involvement Pertinent to Current Situation/Hospitalization: No - Comment as needed   Emotional Assessment Appearance:: Appears stated age       Alcohol / Substance Use: Not Applicable Psych Involvement: No (comment)  Admission diagnosis:  Acute blood loss anemia [D62] Symptomatic anemia [D64.9] Patient Active Problem List   Diagnosis Date Noted   Acute blood loss anemia 03/20/2022   Acute on chronic blood loss anemia 03/05/2022   Thrombocytopenia ([place]) 03/05/2022   Fever 02/08/2022   Pancytopenia ([place]) 02/07/2022   GI bleeding 02/04/2022   Chronic kidney disease, stage 3b ([place]) 02/04/2022   Depression with anxiety 02/04/2022   Liver cirrhosis secondary to NASH ([place])     Acute GI hemorrhage 01/28/2022   AKI (acute kidney injury) ([place]) 01/27/2022   Insomnia 01/27/2022   Iron deficiency anemia due to chronic blood loss 02/13/2021   History of uterine cancer 02/13/2021   Decompensated hepatic cirrhosis ([place]) 10/25/2020   OSA on CPAP 10/25/2020   Glossitis 10/25/2020   Hyponatremia 10/08/2020   Acute respiratory failure with hypoxia ([place]) 09/10/2020   Pneumonia due to COVID-19 virus 09/08/2020   Hip fracture, right ([place]) 09/04/2020   Closed right hip fracture ([place]) 09/02/2020   Type 2 diabetes mellitus with hyperlipidemia ([place]) 09/02/2020   Thalassemia minor    HLD (hyperlipidemia)    Acute kidney injury superimposed on CKD ([place])    Depression    Fall at home, initial encounter    Nondisplaced fracture of greater trochanter of right femur, initial encounter for closed fracture ([place])    PCP:  [name], MD Pharmacy:   [facility] [address] [place] [address] [place] [address] Phone: [phone] Fax: [phone]  Readmission Risk Interventions    03/26/2022    2:40 PM  Readmission Risk Prevention Plan  Transportation Screening Complete  Medication Review (RN Care Manager) Complete  PCP or Specialist appointment within 3-5 days of discharge Complete  HRI or [place] Complete  SW Recovery Care/Counseling Consult Complete  Palliative Care Screening Not [place] Not Applicable

## 2022-03-26 NOTE — Plan of Care (Signed)
  Problem: Skin Integrity: Goal: Risk for impaired skin integrity will decrease Outcome: Progressing   Problem: Activity: Goal: Risk for activity intolerance will decrease Outcome: Progressing   Problem: Nutrition: Goal: Adequate nutrition will be maintained Outcome: Progressing   Problem: Pain Managment: Goal: General experience of comfort will improve Outcome: Progressing

## 2022-03-27 ENCOUNTER — Ambulatory Visit (HOSPITAL_COMMUNITY): Payer: 59 | Admitting: Physician Assistant

## 2022-03-27 DIAGNOSIS — N1832 Chronic kidney disease, stage 3b: Secondary | ICD-10-CM | POA: Diagnosis not present

## 2022-03-27 DIAGNOSIS — D649 Anemia, unspecified: Secondary | ICD-10-CM | POA: Diagnosis not present

## 2022-03-27 DIAGNOSIS — D62 Acute posthemorrhagic anemia: Secondary | ICD-10-CM | POA: Diagnosis not present

## 2022-03-27 DIAGNOSIS — K7581 Nonalcoholic steatohepatitis (NASH): Secondary | ICD-10-CM | POA: Diagnosis not present

## 2022-03-27 LAB — BASIC METABOLIC PANEL
Anion gap: 4 — ABNORMAL LOW (ref 5–15)
BUN: 28 mg/dL — ABNORMAL HIGH (ref 8–23)
CO2: 22 mmol/L (ref 22–32)
Calcium: 8.1 mg/dL — ABNORMAL LOW (ref 8.9–10.3)
Chloride: 112 mmol/L — ABNORMAL HIGH (ref 98–111)
Creatinine, Ser: 1.2 mg/dL — ABNORMAL HIGH (ref 0.44–1.00)
GFR, Estimated: 51 mL/min — ABNORMAL LOW (ref >60)
Glucose, Bld: 143 mg/dL — ABNORMAL HIGH (ref 70–99)
Potassium: 3.8 mmol/L (ref 3.5–5.1)
Sodium: 138 mmol/L (ref 135–145)

## 2022-03-27 LAB — CBC
HCT: 23.6 % — ABNORMAL LOW (ref 36.0–46.0)
HCT: 29.9 % — ABNORMAL LOW (ref 36.0–46.0)
Hemoglobin: 7.4 g/dL — ABNORMAL LOW (ref 12.0–15.0)
Hemoglobin: 9.6 g/dL — ABNORMAL LOW (ref 12.0–15.0)
MCH: 26.2 pg (ref 26.0–34.0)
MCH: 27.3 pg (ref 26.0–34.0)
MCHC: 31.4 g/dL (ref 30.0–36.0)
MCHC: 32.1 g/dL (ref 30.0–36.0)
MCV: 83.7 fL (ref 80.0–100.0)
MCV: 84.9 fL (ref 80.0–100.0)
Platelets: 90 10*3/uL — ABNORMAL LOW (ref 150–400)
Platelets: 123 10*3/uL — ABNORMAL LOW (ref 150–400)
RBC: 2.82 MIL/uL — ABNORMAL LOW (ref 3.87–5.11)
RBC: 3.52 MIL/uL — ABNORMAL LOW (ref 3.87–5.11)
RDW: 19.4 % — ABNORMAL HIGH (ref 11.5–15.5)
RDW: 18.8 % — ABNORMAL HIGH (ref 11.5–15.5)
WBC: 4.2 10*3/uL (ref 4.0–10.5)
WBC: 5.6 10*3/uL (ref 4.0–10.5)
nRBC: 0 % (ref 0.0–0.2)
nRBC: 0 % (ref 0.0–0.2)

## 2022-03-27 LAB — PREPARE RBC (CROSSMATCH)

## 2022-03-27 MED ORDER — SODIUM CHLORIDE 0.9% IV SOLUTION: Freq: Once | INTRAVENOUS | Status: AC

## 2022-03-27 NOTE — TOC Progression Note (Signed)
Transition of Care [facility]) - Progression Note    Patient Details  Name: Rachel Huber MRN: [id number] Date of Birth: 09-19-1955  [facility]) CM/SW Contact  Graves-Bigelow, Ocie Cornfield, RN Phone Number: 03/27/2022, 3:56 PM  Clinical Narrative:  Patient was discussed in the QC rounds today. Patient received one unit PRBCs' today 2/2 Hgb 7.4. No home needs have been identified at this time. Case Manager will continue to follow for additional disposition needs.    Expected Discharge Plan: Home/Self Care Barriers to Discharge: Continued Medical Work up  Expected Discharge Plan and Services Expected Discharge Plan: Home/Self Care In-house Referral: NA Discharge Planning Services: CM Consult   Living arrangements for the past 2 months: Single Family Home                   DME Agency: NA   Readmission Risk Interventions    03/26/2022    2:40 PM  Readmission Risk Prevention Plan  Transportation Screening Complete  Medication Review ([place]) Complete  PCP or Specialist appointment within 3-5 days of discharge Complete  HRI or [place] Complete  SW Recovery Care/Counseling Consult Complete  [place] Not Applicable

## 2022-03-27 NOTE — Progress Notes (Signed)
[facility] PROGRESS NOTE    Progress Note  Rachel Huber  ZJQ:[id number] DOB: 1955-12-24 DOA: 03/20/2022 PCP: [name], MD     Brief Narrative:   Rachel Huber is an 66 y.o. female past medical history of [name] cirrhosis with portal hypertension status post TIPS in May 2023 and subsequent TIPS revision and dilation, recurrent hemorrhoidal bleeding status post coil embolization of rectal arteries on 02/07/2022 at [facility], weekly paracentesis, diabetes mellitus type 2 obstructive sleep apnea on CPAP went to the ER as she was referred by her hematologist for a hemoglobin of 4 at [facility], transferred to [facility] in case intervention was necessary, she has been transfused 4 units of packed red blood cells.  IR was consulted consulted due to concerns that she might have brisk bleeding requiring recurrent embolization.  She appears to be stable with only minimal occasional bright red blood per rectum her hemoglobin has remained relatively stable after transfusions.  She underwent paracentesis on 03/25/2022 yielded 4 L of fluid has remained afebrile hemoglobin continues to wax and wane.  He denies any bloody stools or melanotic stools. GI evaluated the patient and relates that is likely related to recurrent hemorrhoids and recommended formal evaluation by interventional radiology for possible embolization.  Has she has had multiple lower scope without any identifiable source.    Assessment/Plan:   Acute blood loss anemia lower  GI bleeding Status post 5 units of packed red blood cells.   Her hemoglobin continues to drift down this morning 7.4 we will transfuse 1 unit of packed red blood cells. Her hemoglobin continues to drift down we will go ahead and transfuse 1 unit of packed red blood cells. Physical therapy evaluated the patient recommended NO  home health PT.  Liver cirrhosis secondary to NASH [facility]) With a history of TIPS and revision at [facility] is being followed for  potential transplant. She does require frequent paracentesis. Likely stable at this time her last paracentesis was on 03/25/2022 yielded 3.7 L of fluid.  HLD (hyperlipidemia) Continue statins.  Hypokalemia: Supplement orally and continue to monitor.  Acute kidney injury on chronic kidney disease stage IIIb: Mild component due to decreased perfusion in the setting of anemia with a baseline creatinine around 1.5 on admission 2.2. Improved with fluid resuscitation.  Diabetes mellitus type 2: fairly controlled without any insulin.   DVT prophylaxis: SCD Family Communication:none Status is: Inpatient Remains inpatient appropriate because: Chronic GI bleed    Code Status:     Code Status Orders  (From admission, onward)           Start     Ordered   03/20/22 2024  Full code  Continuous        03/20/22 2024           Code Status History     Date Active Date Inactive Code Status Order ID Comments User Context   03/05/2022 2106 03/07/2022 2101 Full Code [id number]  Athena Masse, MD ED   02/04/2022 1426 02/10/2022 1837 Full Code [id number]  Ivor Costa, MD ED   01/27/2022 2249 01/30/2022 2232 Full Code [id number]  [place], [place], DO Inpatient   10/25/2020 2151 10/30/2020 1631 Full Code [id number]  Orene Desanctis, DO ED   10/08/2020 1439 10/11/2020 2211 Full Code [id number]  Collier Bullock, MD ED   09/02/2020 0957 09/13/2020 1925 Full Code [id number]  Ivor Costa, MD ED         IV Access:   Peripheral  IV   Procedures and diagnostic studies:   IR Paracentesis  Result Date: 03/25/2022 INDICATION: [name] cirrhosis status post tips procedure at outside facility with recurrent ascites. Request is for therapeutic paracentesis EXAM: ULTRASOUND GUIDED THERAPEUTIC PARACENTESIS MEDICATIONS: Lidocaine 1% 15 mL COMPLICATIONS: None immediate. PROCEDURE: Informed written consent was obtained from the patient after a discussion of the risks, benefits and alternatives to treatment. A timeout  was performed prior to the initiation of the procedure. Initial ultrasound scanning demonstrates a moderate amount of ascites within the right lower abdominal quadrant. The right lower abdomen was prepped and draped in the usual sterile fashion. 1% lidocaine was used for local anesthesia. Following this, a 19 gauge, 7-cm, Yueh catheter was introduced. An ultrasound image was saved for documentation purposes. The paracentesis was performed. The catheter was removed and a dressing was applied. The patient tolerated the procedure well without immediate post procedural complication. FINDINGS: A total of approximately 3.7 of straw-colored fluid was removed. IMPRESSION: Successful ultrasound-guided therapeutic paracentesis yielding 3.7 liters of peritoneal fluid. Read by: Rushie Nyhan, NP Electronically Signed   By: Miachel Roux M.D.   On: 03/25/2022 10:10     Medical Consultants:   None.   Subjective:    Rachel Huber has had a bowel movement every day no melanotic stools.  Objective:    Vitals:   03/26/22 0544 03/26/22 1201 03/26/22 2049 03/27/22 0616  BP: (!) 112/46 129/78 (!) 113/55 (!) 118/53  Pulse: 79 98 83 84  Resp: 16 18 18 18   Temp: 98.4 F (36.9 C) 98.1 F (36.7 C) 98.7 F (37.1 C) 98.4 F (36.9 C)  TempSrc: Oral Oral Oral Oral  SpO2: 96% 98% 99% 95%  Weight:      Height:       SpO2: 95 %   Intake/Output Summary (Last 24 hours) at 03/27/2022 0845 Last data filed at 03/27/2022 0700 Gross per 24 hour  Intake --  Output 400 ml  Net -400 ml    Filed Weights   03/20/22 1311 03/21/22 1508  Weight: 114.8 kg 113.3 kg    Exam: General exam: In no acute distress. Respiratory system: Good air movement and clear to auscultation. Cardiovascular system: S1 & S2 heard, RRR. No JVD. Gastrointestinal system: Abdomen is nondistended, soft and nontender.  Extremities: No pedal edema. Skin: No rashes, lesions or ulcers Psychiatry: Judgement and insight appear normal. Mood  & affect appropriate.  Data Reviewed:    Labs: Basic Metabolic Panel: Recent Labs  Lab 03/23/22 0329 03/24/22 0319 03/25/22 0354 03/26/22 0313 03/27/22 0401  NA 136 138 139 138 138  K 3.6 3.3* 3.4* 3.5 3.8  CL 108 109 111 111 112*  CO2 23 22 22 23 22   GLUCOSE 191* 199* 144* 160* 143*  BUN 31* 28* 25* 25* 28*  CREATININE 1.69* 1.43* 1.29* 1.19* 1.20*  CALCIUM 7.8* 7.6* 8.3* 8.0* 8.1*    GFR Estimated Creatinine Clearance: 63.6 mL/min (A) (by C-G formula based on SCr of 1.2 mg/dL (H)). Liver Function Tests: Recent Labs  Lab 03/20/22 1307 03/22/22 0206 03/25/22 0354 03/26/22 0313  AST 45* 48* 37 37  ALT 25 24 19 20   ALKPHOS 104 82 70 75  BILITOT 2.1* 3.0* 1.9* 1.7*  PROT 4.5* 3.9* 3.9* 4.0*  ALBUMIN 2.4* 2.1* 1.9* 1.9*    No results for input(s): "LIPASE", "AMYLASE" in the last 168 hours. No results for input(s): "AMMONIA" in the last 168 hours. Coagulation profile Recent Labs  Lab 03/22/22 0206  INR 1.3*  COVID-19 Labs  No results for input(s): "DDIMER", "FERRITIN", "LDH", "CRP" in the last 72 hours.  Lab Results  Component Value Date   SARSCOV2NAA POSITIVE (A) 10/08/2020   SARSCOV2NAA POSITIVE (A) 09/02/2020    CBC: Recent Labs  Lab 03/20/22 1307 03/21/22 0120 03/24/22 0319 03/24/22 1231 03/25/22 0354 03/26/22 0313 03/27/22 0401  WBC 7.4   < > 3.5* 4.0 3.7* 4.0 4.2  NEUTROABS 5.0  --   --   --   --   --   --   HGB 4.0*   < > 6.7* 9.6* 8.0* 7.8* 7.4*  HCT 13.4*   < > 21.1* 28.4* 24.8* 23.7* 23.6*  MCV 78.4*   < > 82.7 80.9 84.1 83.2 83.7  PLT 211   < > 112* 118* 114* 104* 90*   < > = values in this interval not displayed.    Cardiac Enzymes: No results for input(s): "CKTOTAL", "CKMB", "CKMBINDEX", "TROPONINI" in the last 168 hours. BNP (last 3 results) No results for input(s): "PROBNP" in the last 8760 hours. CBG: Recent Labs  Lab 03/22/22 0409 03/22/22 0916 03/22/22 1208 03/22/22 1634 03/23/22 2041  GLUCAP 102* 93 97 164*  182*    D-Dimer: No results for input(s): "DDIMER" in the last 72 hours. Hgb A1c: No results for input(s): "HGBA1C" in the last 72 hours. Lipid Profile: No results for input(s): "CHOL", "HDL", "LDLCALC", "TRIG", "CHOLHDL", "LDLDIRECT" in the last 72 hours. Thyroid function studies: No results for input(s): "TSH", "T4TOTAL", "T3FREE", "THYROIDAB" in the last 72 hours.  Invalid input(s): "FREET3" Anemia work up: No results for input(s): "VITAMINB12", "FOLATE", "FERRITIN", "TIBC", "IRON", "RETICCTPCT" in the last 72 hours. Sepsis Labs: Recent Labs  Lab 03/24/22 1231 03/25/22 0354 03/26/22 0313 03/27/22 0401  WBC 4.0 3.7* 4.0 4.2    Microbiology No results found for this or any previous visit (from the past 240 hour(s)).   Medications:    atorvastatin  10 mg Oral Daily   cholecalciferol  2,000 Units Oral Daily   cyanocobalamin  1,000 mcg Oral Daily   hydrocortisone   Rectal QID   hydrocortisone  25 mg Rectal BID   lactulose  20 g Oral Daily   multivitamin with minerals  1 tablet Oral Daily   polyethylene glycol  17 g Oral BID   potassium chloride  20 mEq Oral Daily   sertraline  50 mg Oral Daily   Continuous Infusions:    LOS: 7 days   Charlynne Cousins  [facility]  03/27/2022, 8:45 AM

## 2022-03-27 NOTE — Plan of Care (Signed)
  Problem: Clinical Measurements: Goal: Diagnostic test results will improve Outcome: Progressing   Problem: Activity: Goal: Risk for activity intolerance will decrease Outcome: Progressing   Problem: Elimination: Goal: Will not experience complications related to urinary retention Outcome: Progressing   Problem: Safety: Goal: Ability to remain free from injury will improve Outcome: Progressing   Problem: Skin Integrity: Goal: Risk for impaired skin integrity will decrease Outcome: Progressing

## 2022-03-27 NOTE — Progress Notes (Signed)
Physical Therapy Treatment Patient Details Name: Rachel Huber MRN: [id number] DOB: [birth year] Today's Date: 03/27/2022   History of Present Illness Pt adm 8/4 with acute blood loss anemia due to lower GI bleed with initial Hgb of 3.9. Transfused 4 units of blood. Pt with recent coil embolization of rectal artery. PMH - NASH s/p TIPS x 2, frequent paracentesis, ckd, DM. RLE ORIF, Rt total shoulder.    PT Comments    Pt tolerates therapy well today, ambulating to the bathroom and back to her recliner with an AD. Pt with antalgic gait pattern today and c/o increased ankle pain. After BM, patient declines further mobility in the hallway but is agreeable to sitting in the recliner (which she has been reluctant to do according to the nurse). Continued therapy will assist the pt in building strength for progression of independence and to maximize safety with mobility.    Recommendations for follow up therapy are one component of a multi-disciplinary discharge planning process, led by the attending physician.  Recommendations may be updated based on patient status, additional functional criteria and insurance authorization.  Follow Up Recommendations  No PT follow up     Assistance Recommended at Discharge PRN  Patient can return home with the following Assistance with cooking/housework   Equipment Recommendations  None recommended by PT    Recommendations for Other Services       Precautions / Restrictions Precautions Precautions: None Restrictions Weight Bearing Restrictions: No     Mobility  Bed Mobility Overal bed mobility: Modified Independent             General bed mobility comments: pt able to come to EOB without assist with slow but steady pace.    Transfers Overall transfer level: Needs assistance Equipment used: Rolling walker (2 wheels) Transfers: Sit to/from Stand Sit to Stand: Supervision                Ambulation/Gait Ambulation/Gait assistance:  Supervision Gait Distance (Feet): 40 Feet Assistive device: Rolling walker (2 wheels) Gait Pattern/deviations: Step-to pattern, Antalgic, Trunk flexed, Decreased stance time - left, Decreased step length - right Gait velocity: decreased Gait velocity interpretation: <1.31 ft/sec, indicative of household ambulator   General Gait Details: Pt c/o ankle pain with ambulation - step-to gait pattern with decreased stance time on LLE; pt declines ambulation in hall after walking to the bathroom - returns to Dentist    Modified Rankin (Stroke Patients Only)       Balance Overall balance assessment: Needs assistance Sitting-balance support: No upper extremity supported, Feet supported Sitting balance-Leahy Scale: Good     Standing balance support: Bilateral upper extremity supported, During functional activity, Reliant on assistive device for balance Standing balance-Leahy Scale: Poor                              Cognition Arousal/Alertness: Awake/alert Behavior During Therapy: WFL for tasks assessed/performed Overall Cognitive Status: Within Functional Limits for tasks assessed                                          Exercises      General Comments General comments (skin integrity, edema, etc.): VSS on RA      Pertinent Vitals/Pain Pain Assessment Pain  Assessment: No/denies pain    Home Living                          Prior Function            PT Goals (current goals can now be found in the care plan section) Acute Rehab PT Goals Patient Stated Goal: return home PT Goal Formulation: With patient Time For Goal Achievement: 04/08/22 Potential to Achieve Goals: Good Progress towards PT goals: Progressing toward goals    Frequency    Min 3X/week      PT Plan Current plan remains appropriate    Co-evaluation              AM-PAC PT "6 Clicks" Mobility   Outcome  Measure  Help needed turning from your back to your side while in a flat bed without using bedrails?: None Help needed moving from lying on your back to sitting on the side of a flat bed without using bedrails?: None Help needed moving to and from a bed to a chair (including a wheelchair)?: A Little Help needed standing up from a chair using your arms (e.g., wheelchair or bedside chair)?: A Little Help needed to walk in hospital room?: A Little Help needed climbing 3-5 steps with a railing? : A Lot 6 Click Score: 19    End of Session   Activity Tolerance: Patient tolerated treatment well Patient left: in chair;with call bell/phone within reach Nurse Communication: Mobility status PT Visit Diagnosis: Muscle weakness (generalized) (M62.81)     Time: 0881-1031 PT Time Calculation (min) (ACUTE ONLY): 51 min  Charges:  $Gait Training: 8-22 mins $Therapeutic Activity: 8-22 mins                     Hall Busing, SPT Acute Rehabilitation Office #: [phone]    Hall Busing 03/27/2022, 5:40 PM

## 2022-03-28 ENCOUNTER — Telehealth: Payer: Self-pay | Admitting: *Deleted

## 2022-03-28 DIAGNOSIS — D649 Anemia, unspecified: Secondary | ICD-10-CM | POA: Diagnosis not present

## 2022-03-28 DIAGNOSIS — N179 Acute kidney failure, unspecified: Secondary | ICD-10-CM | POA: Diagnosis not present

## 2022-03-28 DIAGNOSIS — E1169 Type 2 diabetes mellitus with other specified complication: Secondary | ICD-10-CM | POA: Diagnosis not present

## 2022-03-28 DIAGNOSIS — E785 Hyperlipidemia, unspecified: Secondary | ICD-10-CM | POA: Diagnosis not present

## 2022-03-28 DIAGNOSIS — N1832 Chronic kidney disease, stage 3b: Secondary | ICD-10-CM | POA: Diagnosis not present

## 2022-03-28 DIAGNOSIS — K922 Gastrointestinal hemorrhage, unspecified: Secondary | ICD-10-CM | POA: Diagnosis not present

## 2022-03-28 DIAGNOSIS — D62 Acute posthemorrhagic anemia: Secondary | ICD-10-CM | POA: Diagnosis not present

## 2022-03-28 LAB — TYPE AND SCREEN
ABO/RH(D): A POS
Antibody Screen: NEGATIVE
Unit division: 0

## 2022-03-28 LAB — BPAM RBC
Blood Product Expiration Date: 202309022359
ISSUE DATE / TIME: 202308101249
Unit Type and Rh: 6200

## 2022-03-28 NOTE — Plan of Care (Signed)

## 2022-03-28 NOTE — Chronic Care Management (AMB) (Signed)
  Care Coordination   Note   03/28/2022 Name: Rachel Huber MRN: [id number] DOB: Apr 12, 1956  Rachel Huber is a 66 y.o. year old female who sees [name], MD for primary care. I reached out to Rachel Huber by phone today to offer care coordination services.  Scheduled from referral   [name] was given information about Care Coordination services today including:   The Care Coordination services include support from the care team which includes your Nurse Coordinator, Clinical Social Worker, or Pharmacist.  The Care Coordination team is here to help remove barriers to the health concerns and goals most important to you. Care Coordination services are voluntary, and the patient may decline or stop services at any time by request to their care team member.   Care Coordination Consent Status: Patient agreed to services and verbal consent obtained.   Follow up plan:  Telephone appointment with care coordination team member scheduled for:  04/04/2022  Encounter Outcome:  Pt. Scheduled  Julian Hy, [place] Direct Dial: [phone]

## 2022-03-28 NOTE — Discharge Instructions (Signed)
Recommendations for Outpatient Follow-up:  Follow up with PCP in 1-2 weeks Please obtain BMP/CBC in one week

## 2022-03-28 NOTE — Discharge Summary (Signed)
Physician Discharge Summary  Rachel Huber OXB:[id number] DOB: [birth year] DOA: 03/20/2022  PCP: [name], MD  Admit date: 03/20/2022 Discharge date: 03/28/2022  Admitted From: Home Disposition:  Home  Recommendations for Outpatient Follow-up:  Follow up with PCP in 1-2 weeks Please obtain BMP/CBC in one week   Home Health:no Equipment/Devices:None  Discharge Condition:Stable CODE STATUS:Full Diet recommendation: Heart Healthy  Brief/Interim Summary: 66 y.o. female past medical history of [name] cirrhosis with portal hypertension status post TIPS in May 2023 and subsequent TIPS revision and dilation, recurrent hemorrhoidal bleeding status post coil embolization of rectal arteries on 02/07/2022 at [facility], weekly paracentesis, diabetes mellitus type 2 obstructive sleep apnea on CPAP went to the ER as she was referred by her hematologist for a hemoglobin of 4 at [facility], transferred to [facility] in case intervention was necessary, she has been transfused 4 units of packed red blood cells.  IR was consulted consulted due to concerns that she might have brisk bleeding requiring recurrent embolization.  She appears to be stable with only minimal occasional bright red blood per rectum her hemoglobin has remained relatively stable after transfusions.  She underwent paracentesis on 03/25/2022 yielded 4 L of fluid has remained afebrile hemoglobin continues to wax and wane.  He denies any bloody stools or melanotic stools. GI evaluated the patient and relates that is likely related to recurrent hemorrhoids and recommended formal evaluation by interventional radiology for possible embolization.  Has she has had multiple lower scope without any identifiable source.  Discharge Diagnoses:  Principal Problem:   Acute blood loss anemia Active Problems:   GI bleeding   Liver cirrhosis secondary to NASH (HCC)   HLD (hyperlipidemia)   Chronic kidney disease, stage 3b (HCC)   Thalassemia  minor   Type 2 diabetes mellitus with hyperlipidemia (HCC)  Acute blood loss anemia/acute GI bleed: She status post 5 units of packed red blood cells, hemoglobin is 8.3 on the day of discharge. In the setting of hemorrhoidal and varices, hemoglobin on admission was confirmed at 3.9 IR was consulted due to concern for her bleeding as she had brisk bleeding requiring embolization in the past. GI evaluated the patient and relayed the patient likely having recurrent hemorrhoidal bleed. Her hemoglobin was monitored in the hospital for 4 days and remained relatively stable after this. GI recommended no further workup. IR had no source identifiable for bleedings with nose intervention was recommended.  Liver cirrhosis secondary to NASH: With history of TIPS and revisions at [facility] she being followed as an outpatient for potential transplant. She is required frequent paracentesis her last paracentesis was on 03/25/2022 yielded 4 L of fluid clear and has remained stable since then.  Hyperlipidemia Continue statins.  Kalemia: Supplemented orally now resolved.  Acute kidney injury on chronic kidney stage IIIb: Likely due to hypoperfusion baseline creatinine around 1.5 on admission 2.2 improved with fluid resuscitation and blood transfusions.  Diabetes mellitus type 2: No change made to her medication.  Discharge Instructions  Discharge Instructions     AMB Referral to [facility] Coordinaton   Complete by: As directed    Hospital referral:  Please assign for post hospital care coordination for readmission prevention due to high risk score  [facility] ACO:  [employer]  Please assign to [facility] Coordinator for care coordination complex disease management follow up calls and assess for further needs.  Questions please call:   Natividad Brood, RN BSN [facility] Liaison  [phone]  business mobile phone Toll free office [phone]  Fax number:  [phone] [email or web address]   Reason for Referral: Care Coordination   Disease managment services needed: Nurse Case Manager   Diagnoses of:  Other Diabetes     Other Diagnosis: Acute Blood loss anemia with readmission   Expected date of contact: Emergent - 3 Days   Diet - low sodium heart healthy   Complete by: As directed    Increase activity slowly   Complete by: As directed       Allergies as of 03/28/2022       Reactions   Gramineae Pollens Other (See Comments)   Sneezing, Running nose   Latex Rash   Contact rash        Medication List     TAKE these medications    acetaminophen 650 MG CR tablet Commonly known as: TYLENOL Take 1,300 mg by mouth daily as needed for pain.   albuterol 108 (90 Base) MCG/ACT inhaler Commonly known as: VENTOLIN HFA Inhale 2 puffs into the lungs every 6 (six) hours as needed for wheezing or shortness of breath.   atorvastatin 10 MG tablet Commonly known as: LIPITOR Take 10 mg by mouth daily.   Cholecalciferol 50 MCG (2000 UT) Caps Take 2,000 Units by mouth daily.   cyanocobalamin 1000 MCG tablet Commonly known as: VITAMIN B12 Take 1,000 mcg by mouth daily.   diphenhydrAMINE 25 MG tablet Commonly known as: BENADRYL Take 25 mg by mouth 2 (two) times daily as needed for itching.   gabapentin 400 MG capsule Commonly known as: NEURONTIN Take 800 mg by mouth 2 (two) times daily.   hydrocortisone 2.5 % rectal cream Commonly known as: ANUSOL-HC Place rectally 4 (four) times daily.   lactulose (encephalopathy) 10 GM/15ML Soln Commonly known as: CHRONULAC Take 30 mLs (20 g total) by mouth daily. What changed: how much to take   melatonin 5 MG Tabs Take 10 mg by mouth at bedtime as needed (sleep).   multivitamin with minerals tablet Take 1 tablet by mouth daily.   sertraline 50 MG tablet Commonly known as: ZOLOFT Take 50 mg by mouth daily.   sodium chloride 0.65 % Soln  nasal spray Commonly known as: OCEAN Place 1 spray into both nostrils as needed for congestion. What changed: when to take this   witch hazel-glycerin pad Commonly known as: TUCKS Apply topically as needed for hemorrhoids.        Allergies  Allergen Reactions   Gramineae Pollens Other (See Comments)    Sneezing, Running nose   Latex Rash    Contact rash    Consultations: Gastroenterology Interventional radiology   Procedures/Studies: IR Paracentesis  Result Date: 03/25/2022 INDICATION: [name] cirrhosis status post tips procedure at outside facility with recurrent ascites. Request is for therapeutic paracentesis EXAM: ULTRASOUND GUIDED THERAPEUTIC PARACENTESIS MEDICATIONS: Lidocaine 1% 15 mL COMPLICATIONS: None immediate. PROCEDURE: Informed written consent was obtained from the patient after a discussion of the risks, benefits and alternatives to treatment. A timeout was performed prior to the initiation of the procedure. Initial ultrasound scanning demonstrates a moderate amount of ascites within the right lower abdominal quadrant. The right lower abdomen was prepped and draped in the usual sterile fashion. 1% lidocaine was used for local anesthesia. Following this, a 19 gauge, 7-cm, Yueh catheter was introduced. An ultrasound image was saved for documentation purposes. The paracentesis was performed. The catheter was removed and a dressing was applied. The patient tolerated the procedure well without immediate post procedural complication.  FINDINGS: A total of approximately 3.7 of straw-colored fluid was removed. IMPRESSION: Successful ultrasound-guided therapeutic paracentesis yielding 3.7 liters of peritoneal fluid. Read by: Rushie Nyhan, NP Electronically Signed   By: Miachel Roux M.D.   On: 03/25/2022 10:10   IR Radiologist Eval & Mgmt  Result Date: 03/18/2022 Please refer to notes tab for details about interventional procedure. (Op Note)  US Paracentesis  Result Date:  03/18/2022 INDICATION: History of NASH cirrhosis. Recent history of tips procedure at outside facility. Recurrent ascites. Request for therapeutic paracentesis. EXAM: ULTRASOUND GUIDED RIGHT LOWER QUADRANT PARACENTESIS MEDICATIONS: 1% plain lidocaine, 12 mL COMPLICATIONS: None immediate. PROCEDURE: Informed written consent was obtained from the patient after a discussion of the risks, benefits and alternatives to treatment. A timeout was performed prior to the initiation of the procedure. Initial ultrasound scanning demonstrates a large amount of ascites within the right lower abdominal quadrant. The right lower abdomen was prepped and draped in the usual sterile fashion. 1% lidocaine was used for local anesthesia. Following this, a 19 gauge, 7-cm, Yueh catheter was introduced. An ultrasound image was saved for documentation purposes. The paracentesis was performed. The catheter was removed and a dressing was applied. The patient tolerated the procedure well without immediate post procedural complication. FINDINGS: A total of approximately 6.8 L of clear yellow fluid was removed. IMPRESSION: Successful ultrasound-guided paracentesis yielding 6.8 liters of peritoneal fluid. Read by: Ascencion Dike PA-C Electronically Signed   By: Aletta Edouard M.D.   On: 03/18/2022 13:18   US Paracentesis  Result Date: 03/11/2022 INDICATION: Patient with history of NASH cirrhosis and recurrent ascites status post tips procedure at [facility], maximum volume request of 8 L to be drawn off per ordering provider. EXAM: ULTRASOUND GUIDED  PARACENTESIS MEDICATIONS: Local 1% lidocaine only. COMPLICATIONS: None immediate. PROCEDURE: Informed written consent was obtained from the patient after a discussion of the risks, benefits and alternatives to treatment. A timeout was performed prior to the initiation of the procedure. Initial ultrasound scanning demonstrates a large amount of ascites within the right upper abdominal quadrant. The right  upper abdomen was prepped and draped in the usual sterile fashion. 1% lidocaine was used for local anesthesia. Following this, a 19 gauge, 10-cm, Yueh catheter was introduced. An ultrasound image was saved for documentation purposes. The paracentesis was performed. The catheter was removed and a dressing was applied. The patient tolerated the procedure well without immediate post procedural complication. Patient received post-procedure intravenous albumin; see nursing notes for details. FINDINGS: A total of approximately 7.2 L of clear yellow fluid was removed. IMPRESSION: Successful ultrasound-guided paracentesis yielding 7.2 liters of peritoneal fluid. PLAN: The patient has reportedly had a TIPS procedure at [facility] on 12/26/2021 and is awaiting liver transplant there, she is followed by the liver clinic at [facility]. She is also scheduled for a TIPS revision next week. This exam was performed by Tsosie Billing PA-C, and was supervised and interpreted by Dr. Kathlene Cote. Electronically Signed   By: Aletta Edouard M.D.   On: 03/11/2022 14:32   US Paracentesis  Result Date: 03/04/2022 INDICATION: Patient with history of NASH cirrhosis and recurrent ascites status post tips procedure at [facility], maximum volume request of 8 L to be drawn off per ordering provider. EXAM: ULTRASOUND GUIDED  PARACENTESIS MEDICATIONS: 1% lidocaine only. COMPLICATIONS: None immediate. PROCEDURE: Informed written consent was obtained from the patient after a discussion of the risks, benefits and alternatives to treatment. A timeout was performed prior to the initiation of the procedure. Initial  ultrasound scanning demonstrates a large amount of ascites within the right upper abdominal quadrant. The right upper abdomen was prepped and draped in the usual sterile fashion. 1% lidocaine was used for local anesthesia. Following this, a 19 gauge, 10-cm, Yueh catheter was introduced. An ultrasound image was saved for documentation  purposes. The paracentesis was performed. The catheter was removed and a dressing was applied. The patient tolerated the procedure well without immediate post procedural complication. Patient received post-procedure intravenous albumin; see nursing notes for details. FINDINGS: A total of approximately 7 Liters of clear yellow fluid was removed. IMPRESSION: Successful ultrasound-guided paracentesis yielding 7 liters of peritoneal fluid. PLAN: The patient has reportedly had a TIPS procedure at [facility] on 12/26/2021 and is awaiting liver transplant there, she is followed by the liver clinic at [facility]. This exam was performed by Tsosie Billing PA-C, and was supervised and interpreted by Dr. Annamaria Boots Electronically Signed   By: Jerilynn Mages.  Shick M.D.   On: 03/04/2022 12:35   (Echo, Carotid, EGD, Colonoscopy, ERCP)    Subjective: No complaints  Discharge Exam: Vitals:   03/27/22 2148 03/28/22 0332  BP: (!) 112/47 (!) 107/38  Pulse: 86 79  Resp: 17 18  Temp: 99.5 F (37.5 C) 98.6 F (37 C)  SpO2: 96% 97%   Vitals:   03/27/22 1311 03/27/22 1541 03/27/22 2148 03/28/22 0332  BP: (!) 113/51 (!) 116/47 (!) 112/47 (!) 107/38  Pulse: 83 85 86 79  Resp: 17 16 17 18   Temp: 98.1 F (36.7 C) 98.9 F (37.2 C) 99.5 F (37.5 C) 98.6 F (37 C)  TempSrc: Oral Oral Oral Oral  SpO2: 96% 98% 96% 97%  Weight:      Height:        General: Pt is alert, awake, not in acute distress Cardiovascular: RRR, S1/S2 +, no rubs, no gallops Respiratory: CTA bilaterally, no wheezing, no rhonchi Abdominal: Soft, NT, ND, bowel sounds + Extremities: no edema, no cyanosis    The results of significant diagnostics from this hospitalization (including imaging, microbiology, ancillary and laboratory) are listed below for reference.     Microbiology: No results found for this or any previous visit (from the past 240 hour(s)).   Labs: BNP (last 3 results) No results for input(s): "BNP" in the last 8760  hours. Basic Metabolic Panel: Recent Labs  Lab 03/23/22 0329 03/24/22 0319 03/25/22 0354 03/26/22 0313 03/27/22 0401  NA 136 138 139 138 138  K 3.6 3.3* 3.4* 3.5 3.8  CL 108 109 111 111 112*  CO2 23 22 22 23 22   GLUCOSE 191* 199* 144* 160* 143*  BUN 31* 28* 25* 25* 28*  CREATININE 1.69* 1.43* 1.29* 1.19* 1.20*  CALCIUM 7.8* 7.6* 8.3* 8.0* 8.1*   Liver Function Tests: Recent Labs  Lab 03/22/22 0206 03/25/22 0354 03/26/22 0313  AST 48* 37 37  ALT 24 19 20   ALKPHOS 82 70 75  BILITOT 3.0* 1.9* 1.7*  PROT 3.9* 3.9* 4.0*  ALBUMIN 2.1* 1.9* 1.9*   No results for input(s): "LIPASE", "AMYLASE" in the last 168 hours. No results for input(s): "AMMONIA" in the last 168 hours. CBC: Recent Labs  Lab 03/24/22 1231 03/25/22 0354 03/26/22 0313 03/27/22 0401 03/27/22 1818  WBC 4.0 3.7* 4.0 4.2 5.6  HGB 9.6* 8.0* 7.8* 7.4* 9.6*  HCT 28.4* 24.8* 23.7* 23.6* 29.9*  MCV 80.9 84.1 83.2 83.7 84.9  PLT 118* 114* 104* 90* 123*   Cardiac Enzymes: No results for input(s): "CKTOTAL", "CKMB", "CKMBINDEX", "TROPONINI" in  the last 168 hours. BNP: Invalid input(s): "POCBNP" CBG: Recent Labs  Lab 03/22/22 0409 03/22/22 0916 03/22/22 1208 03/22/22 1634 03/23/22 2041  GLUCAP 102* 93 97 164* 182*   D-Dimer No results for input(s): "DDIMER" in the last 72 hours. Hgb A1c No results for input(s): "HGBA1C" in the last 72 hours. Lipid Profile No results for input(s): "CHOL", "HDL", "LDLCALC", "TRIG", "CHOLHDL", "LDLDIRECT" in the last 72 hours. Thyroid function studies No results for input(s): "TSH", "T4TOTAL", "T3FREE", "THYROIDAB" in the last 72 hours.  Invalid input(s): "FREET3" Anemia work up No results for input(s): "VITAMINB12", "FOLATE", "FERRITIN", "TIBC", "IRON", "RETICCTPCT" in the last 72 hours. Urinalysis    Component Value Date/Time   COLORURINE YELLOW (A) 10/25/2020 2026   APPEARANCEUR HAZY (A) 10/25/2020 2026   LABSPEC 1.017 10/25/2020 2026   PHURINE 5.0 10/25/2020  2026   GLUCOSEU NEGATIVE 10/25/2020 2026   HGBUR SMALL (A) 10/25/2020 2026   BILIRUBINUR NEGATIVE 10/25/2020 2026   KETONESUR NEGATIVE 10/25/2020 2026   PROTEINUR NEGATIVE 10/25/2020 2026   NITRITE POSITIVE (A) 10/25/2020 2026   LEUKOCYTESUR SMALL (A) 10/25/2020 2026   Sepsis Labs Recent Labs  Lab 03/25/22 0354 03/26/22 0313 03/27/22 0401 03/27/22 1818  WBC 3.7* 4.0 4.2 5.6   Microbiology No results found for this or any previous visit (from the past 240 hour(s)).   Time coordinating discharge: Over 30 minutes  SIGNED:   Charlynne Cousins, MD  [facility] 03/28/2022, 10:26 AM Pager   If 7PM-7AM, please contact night-coverage [email or web address] Password [email or web address]

## 2022-03-28 NOTE — Progress Notes (Signed)
Nsg Discharge Note  Admit Date:  03/20/2022 Discharge date: 03/28/2022   Rachel Huber to be D/C'd Home per MD order.  AVS completed. Patient/caregiver able to verbalize understanding.  Discharge Medication: Allergies as of 03/28/2022       Reactions   Gramineae Pollens Other (See Comments)   Sneezing, Running nose   Latex Rash   Contact rash        Medication List     TAKE these medications    acetaminophen 650 MG CR tablet Commonly known as: TYLENOL Take 1,300 mg by mouth daily as needed for pain.   albuterol 108 (90 Base) MCG/ACT inhaler Commonly known as: VENTOLIN HFA Inhale 2 puffs into the lungs every 6 (six) hours as needed for wheezing or shortness of breath.   atorvastatin 10 MG tablet Commonly known as: LIPITOR Take 10 mg by mouth daily.   Cholecalciferol 50 MCG (2000 UT) Caps Take 2,000 Units by mouth daily.   cyanocobalamin 1000 MCG tablet Commonly known as: VITAMIN B12 Take 1,000 mcg by mouth daily.   diphenhydrAMINE 25 MG tablet Commonly known as: BENADRYL Take 25 mg by mouth 2 (two) times daily as needed for itching.   gabapentin 400 MG capsule Commonly known as: NEURONTIN Take 800 mg by mouth 2 (two) times daily.   hydrocortisone 2.5 % rectal cream Commonly known as: ANUSOL-HC Place rectally 4 (four) times daily.   lactulose (encephalopathy) 10 GM/15ML Soln Commonly known as: CHRONULAC Take 30 mLs (20 g total) by mouth daily. What changed: how much to take   melatonin 5 MG Tabs Take 10 mg by mouth at bedtime as needed (sleep).   multivitamin with minerals tablet Take 1 tablet by mouth daily.   sertraline 50 MG tablet Commonly known as: ZOLOFT Take 50 mg by mouth daily.   sodium chloride 0.65 % Soln nasal spray Commonly known as: OCEAN Place 1 spray into both nostrils as needed for congestion. What changed: when to take this   witch hazel-glycerin pad Commonly known as: TUCKS Apply topically as needed for hemorrhoids.         Discharge Assessment: Vitals:   03/27/22 2148 03/28/22 0332  BP: (!) 112/47 (!) 107/38  Pulse: 86 79  Resp: 17 18  Temp: 99.5 F (37.5 C) 98.6 F (37 C)  SpO2: 96% 97%   Skin clean, dry and intact without evidence of skin break down, no evidence of skin tears noted. IV catheter discontinued intact. Site without signs and symptoms of complications - no redness or edema noted at insertion site, patient denies c/o pain - only slight tenderness at site.  Dressing with slight pressure applied.  D/c Instructions-Education: Discharge instructions given to patient/family with verbalized understanding. D/c education completed with patient/family including follow up instructions, medication list, d/c activities limitations if indicated, with other d/c instructions as indicated by MD - patient able to verbalize understanding, all questions fully answered. Patient instructed to return to ED, call 911, or call MD for any changes in condition.  Patient escorted via [place], and D/C home via private auto.  Atilano Ina, RN 03/28/2022 11:26 AM

## 2022-03-31 ENCOUNTER — Telehealth: Payer: Self-pay | Admitting: *Deleted

## 2022-03-31 NOTE — Telephone Encounter (Signed)
Rachel Huber was contacted by telephone to verify understanding of discharge instructions status post their most recent discharge from the hospital on the date:  03/28/22.  Inpatient discharge AVS was re-reviewed with patient, along with cancer center appointments.  Verification of understanding for oncology specific follow-up was validated using the Teach Back method.    Transportation to appointments were confirmed for the patient as being self/caregiver.  Rachel Huber's questions were addressed to their satisfaction upon completion of this post discharge follow-up call for outpatient oncology.

## 2022-04-01 ENCOUNTER — Ambulatory Visit
Admission: RE | Admit: 2022-04-01 | Discharge: 2022-04-01 | Disposition: A | Discharge status: Home / Self Care | Payer: 59 | Source: Ambulatory Visit | Attending: Gastroenterology | Admitting: Gastroenterology

## 2022-04-01 ENCOUNTER — Other Ambulatory Visit: Payer: Self-pay | Admitting: Physician Assistant

## 2022-04-01 DIAGNOSIS — R188 Other ascites: Secondary | ICD-10-CM | POA: Diagnosis not present

## 2022-04-01 DIAGNOSIS — K7460 Unspecified cirrhosis of liver: Secondary | ICD-10-CM | POA: Insufficient documentation

## 2022-04-01 DIAGNOSIS — K7581 Nonalcoholic steatohepatitis (NASH): Secondary | ICD-10-CM | POA: Diagnosis present

## 2022-04-01 DIAGNOSIS — D500 Iron deficiency anemia secondary to blood loss (chronic): Secondary | ICD-10-CM

## 2022-04-01 MED ORDER — ALBUMIN HUMAN 25 % IV SOLN: 25.0000 g | Freq: Once | INTRAVENOUS | Status: DC

## 2022-04-01 MED ORDER — ALBUMIN HUMAN 25 % IV SOLN: 25.0000 g | Freq: Once | INTRAVENOUS | Status: AC

## 2022-04-01 MED ORDER — ALBUMIN HUMAN 25 % IV SOLN
INTRAVENOUS | Status: AC
  Administered 2022-04-01: 25 g via INTRAVENOUS
  Filled 2022-04-01: qty 200

## 2022-04-01 NOTE — Progress Notes (Unsigned)
[place], [place] [address]   CLINIC:  Medical Oncology/Hematology  PCP:  [name], MD [place] [address] [phone]   REASON FOR VISIT:  Follow-up for iron deficiency anemia and thalassemia   PRIOR THERAPY: Blood transfusions as needed   CURRENT THERAPY: IV iron infusions as needed  INTERVAL HISTORY:  Rachel Huber 66 y.o. 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  She was last seen by Tarri Abernethy PA-C on 12/19/2021.  At today's visit, she reports feeling fair. After her last visit, she received TIPS procedure at [facility] on 12/26/2021.  She had revision of TIPS on 03/12/2022. Since her last visit, she has been hospitalized 4 times. 01/27/2022 to 01/30/2022: Acute blood loss anemia secondary to lower GI bleed (hemorrhoids versus rectal varices), received 4 units PRBC during admission. 02/04/2022 to 02/10/2022: Acute blood loss anemia secondary to lower GI bleed (hemorrhoids versus rectal varices).  Received 4.5 units PRBC and IV iron during hospitalization.  Flexible sigmoidoscopy on 02/06/2022 showed prolapsed hemorrhoids and internal hemorrhoids.  Interventional radiology performed bilateral superior rectal artery particle and coil embolization on 02/07/2022. 03/05/2022 to 03/07/2022: Acute blood loss anemia secondary to lower GI bleed (hemorrhoids versus rectal varices).  Received 3 units PRBC during admission. 03/20/2022 to 03/28/2022: Acute blood loss anemia secondary to lower GI bleed (hemorrhoids versus rectal varices).  Hgb on 03/20/2022 was critically low at 3.9.  Received 5 units PRBC during hospitalization.  Since her most recent hospital stay (5 days ago), she had 2 episodes of rectal bleeding, enough to turn the toilet water "light red."  Her last paracentesis was yesterday.  She  denies any melena, epistaxis, hematemesis, or hematuria.  She reports fatigue increased form baseline with energy about 50%.  Her headaches are stable at baseline. She denies any pica or restless legs.  No chest pain.  She does have some intermittent dyspnea on exertion and shortness of breath when she has increased fluid buildup and ascites that is pressing on her diaphragm.  She feels lightheaded sometimes after paracentesis, but denies any syncopal episodes.  She is taking vitamin D3 and vitamin B12 supplements.  She continues to receive regular paracentesis for her decompensated liver cirrhosis with ascites.     She has 50% energy and 100% appetite. She endorses that she is maintaining a stable weight.   REVIEW OF SYSTEMS:    Review of Systems  Constitutional:  Positive for fatigue. Negative for appetite change, chills, diaphoresis, fever and unexpected weight change.  HENT:   Negative for lump/mass and nosebleeds.   Eyes:  Negative for eye problems.  Respiratory:  Positive for shortness of breath. Negative for cough and hemoptysis.   Cardiovascular:  Negative for chest pain, leg swelling and palpitations.  Gastrointestinal:  Positive for abdominal distention and blood in stool. Negative for abdominal pain, constipation, diarrhea, nausea and vomiting.  Genitourinary:  Negative for hematuria.   Skin: Negative.   Neurological:  Positive for dizziness, headaches and light-headedness.  Hematological:  Does not bruise/bleed easily.     PAST MEDICAL/SURGICAL HISTORY:  Past Medical History:  Diagnosis Date   Anxiety    Arthritis    Cirrhosis of liver ([place])    Diabetes mellitus without complication (HCC)    Dyspnea    DOE   GERD (gastroesophageal reflux disease)    Hepatitis  PAST   Hypertension    Neuropathy    Neuropathy, diabetic (HCC)    Pneumonia    Sinus complaint    Sleep apnea    CPAP   Thalassemia minor 1992   Past Surgical History:  Procedure Laterality Date    ABDOMINAL HYSTERECTOMY     BREAST BIOPSY Right 02/15/2018   [place] bx 6-6:30 ribbon shape, ONE CORE FRAGMENT WITH FIBROSIS. ONE CORE FRAGMENT WITH PORTION OF A DILATED   BREAST BIOPSY Right 02/15/2018   [place] bx 9:00 heart shape, USUAL DUCTAL HYPERPLASIA   BREAST LUMPECTOMY Right 03/09/2018   Procedure: BREAST LUMPECTOMY x 2;  Surgeon: Benjamine Sprague, DO;  Location: [facility] ORS;  Service: General;  Laterality: Right;   CATARACT EXTRACTION W/PHACO Left 06/11/2016   Procedure: CATARACT EXTRACTION PHACO AND INTRAOCULAR LENS PLACEMENT (IOC);  Surgeon: Estill Cotta, MD;  Location: [facility] ORS;  Service: Ophthalmology;  Laterality: Left;  Lot # [id number] H US:01:38.6 AP%:26.4 CDE:44.15   CATARACT EXTRACTION W/PHACO Right 06/15/2018   Procedure: CATARACT EXTRACTION PHACO AND INTRAOCULAR LENS PLACEMENT (IOC);  Surgeon: Birder Robson, MD;  Location: [facility] ORS;  Service: Ophthalmology;  Laterality: Right;  [place] 00:38.2 CDE 4.23 Fluid Pack Lot # [id number]   COLONOSCOPIES     COLONOSCOPY WITH PROPOFOL N/A 10/10/2020   Procedure: COLONOSCOPY WITH PROPOFOL;  Surgeon: Lesly Rubenstein, MD;  Location: [facility] ENDOSCOPY;  Service: Endoscopy;  Laterality: N/A;   COLONOSCOPY WITH PROPOFOL N/A 11/20/2020   Procedure: COLONOSCOPY WITH PROPOFOL;  Surgeon: Lesly Rubenstein, MD;  Location: [facility] ENDOSCOPY;  Service: Endoscopy;  Laterality: N/A;  DM STAT CBC, BMP COVID POSITIVE 09/02/2020   CTR     ESOPHAGOGASTRODUODENOSCOPY (EGD) WITH PROPOFOL N/A 10/10/2020   Procedure: ESOPHAGOGASTRODUODENOSCOPY (EGD) WITH PROPOFOL;  Surgeon: Lesly Rubenstein, MD;  Location: [facility] ENDOSCOPY;  Service: Endoscopy;  Laterality: N/A;  COVID POSITIVE 10/08/2020   FLEXIBLE SIGMOIDOSCOPY N/A 02/06/2022   Procedure: FLEXIBLE SIGMOIDOSCOPY;  Surgeon: Lesly Rubenstein, MD;  Location: [facility] ENDOSCOPY;  Service: Endoscopy;  Laterality: N/A;  Patient requests anesthesia   IR ANGIOGRAM SELECTIVE EACH ADDITIONAL VESSEL  02/07/2022   IR ANGIOGRAM SELECTIVE  EACH ADDITIONAL VESSEL  02/07/2022   IR ANGIOGRAM VISCERAL SELECTIVE  02/07/2022   IR EMBO ARTERIAL NOT HEMORR HEMANG INC GUIDE ROADMAPPING  02/07/2022   IR PARACENTESIS  12/17/2021   IR PARACENTESIS  03/25/2022   IR RADIOLOGIST EVAL & MGMT  03/18/2022   IR US GUIDE VASC ACCESS RIGHT  02/07/2022   JOINT REPLACEMENT     KNEE SURGERY Right 09/24/2017   plates and pins   TOTAL SHOULDER ARTHROPLASTY Right 09/27/2015    SOCIAL HISTORY:  Social History   Socioeconomic History   Marital status: Married    Spouse name: Not on file   Number of children: 0   Years of education: Not on file   Highest education level: Not on file  Occupational History   Occupation: EMPLOYED  Tobacco Use   Smoking status: Never   Smokeless tobacco: Never  Vaping Use   Vaping Use: Never used  Substance and Sexual Activity   Alcohol use: No   Drug use: Never   Sexual activity: Not Currently  Other Topics Concern   Not on file  Social History Narrative   Not on file   Social Determinants of Health   Financial Resource Strain: Low Risk  (01/31/2021)   Overall Financial Resource Strain (CARDIA)    Difficulty of Paying Living Expenses: Not hard at all  Food Insecurity: No Food Insecurity (  01/31/2021)   Hunger Vital Sign    Worried About Running Out of Food in the Last Year: Never true    [place] in the Last Year: Never true  Transportation Needs: No Transportation Needs (01/31/2021)   PRAPARE - Hydrologist (Medical): No    Lack of Transportation (Non-Medical): No  Physical Activity: Inactive (01/31/2021)   Exercise Vital Sign    Days of Exercise per Week: 0 days    Minutes of Exercise per Session: 0 min  Stress: No Stress Concern Present (01/31/2021)   [place]    Feeling of Stress : Not at all  Social Connections: Moderately Isolated (01/31/2021)   Social Connection and Isolation Panel [NHANES]     Frequency of Communication with Friends and Family: More than three times a week    Frequency of Social Gatherings with Friends and Family: Once a week    Attends Religious Services: Never    Marine scientist or Organizations: No    Attends Archivist Meetings: Never    Marital Status: Married  Human resources officer Violence: Not At Risk (01/31/2021)   Humiliation, Afraid, Rape, and Kick questionnaire    Fear of Current or Ex-Partner: No    Emotionally Abused: No    Physically Abused: No    Sexually Abused: No    FAMILY HISTORY:  Family History  Problem Relation Age of Onset   Breast cancer Mother 85   Lymphoma Mother    Diabetes Father    Kidney cancer Father    Heart disease Father    Diabetes Sister    Breast cancer Sister 79    CURRENT MEDICATIONS:  Outpatient Encounter Medications as of 04/02/2022  Medication Sig   acetaminophen (TYLENOL) 650 MG CR tablet Take 1,300 mg by mouth daily as needed for pain.   albuterol (VENTOLIN HFA) 108 (90 Base) MCG/ACT inhaler Inhale 2 puffs into the lungs every 6 (six) hours as needed for wheezing or shortness of breath.   atorvastatin (LIPITOR) 10 MG tablet Take 10 mg by mouth daily.   Cholecalciferol 50 MCG (2000 UT) CAPS Take 2,000 Units by mouth daily.   diphenhydrAMINE (BENADRYL) 25 MG tablet Take 25 mg by mouth 2 (two) times daily as needed for itching.   gabapentin (NEURONTIN) 400 MG capsule Take 800 mg by mouth 2 (two) times daily.   hydrocortisone (ANUSOL-HC) 2.5 % rectal cream Place rectally 4 (four) times daily.   lactulose, encephalopathy, (CHRONULAC) 10 GM/15ML SOLN Take 30 mLs (20 g total) by mouth daily. (Patient taking differently: Take 15 g by mouth daily.)   melatonin 5 MG TABS Take 10 mg by mouth at bedtime as needed (sleep).   Multiple Vitamins-Minerals (MULTIVITAMIN WITH MINERALS) tablet Take 1 tablet by mouth daily.   sertraline (ZOLOFT) 50 MG tablet Take 50 mg by mouth daily.   sodium chloride (OCEAN) 0.65  % SOLN nasal spray Place 1 spray into both nostrils as needed for congestion. (Patient taking differently: Place 1 spray into both nostrils at bedtime.)   vitamin B-12 (CYANOCOBALAMIN) 1000 MCG tablet Take 1,000 mcg by mouth daily.   witch hazel-glycerin (TUCKS) pad Apply topically as needed for hemorrhoids.   Facility-Administered Encounter Medications as of 04/02/2022  Medication   albumin human 25 % solution 25 g    ALLERGIES:  Allergies  Allergen Reactions   Gramineae Pollens Other (See Comments)    Sneezing, Running nose  Latex Rash    Contact rash     PHYSICAL EXAM:    ECOG PERFORMANCE STATUS: 2 - Symptomatic, <50% confined to bed  There were no vitals filed for this visit. There were no vitals filed for this visit. Physical Exam Constitutional:      Appearance: Normal appearance. She is obese.  HENT:     Head: Normocephalic and atraumatic.     Mouth/Throat:     Mouth: Mucous membranes are moist.  Eyes:     Extraocular Movements: Extraocular movements intact.     Pupils: Pupils are equal, round, and reactive to light.  Cardiovascular:     Rate and Rhythm: Normal rate and regular rhythm.     Pulses: Normal pulses.     Heart sounds: Normal heart sounds.  Pulmonary:     Effort: Pulmonary effort is normal.     Breath sounds: Rales (Faint bibasilar crackles) present.  Abdominal:     General: Bowel sounds are normal. There is distension.     Palpations: Abdomen is soft.     Tenderness: There is no abdominal tenderness.  Musculoskeletal:        General: No swelling.     Right lower leg: Edema (3+ pretibial edema, with pitting edema extending to hips) present.     Left lower leg: Edema (3+ pretibial edema, with pitting edema extending to hips) present.  Lymphadenopathy:     Cervical: No cervical adenopathy.  Skin:    General: Skin is warm and dry.  Neurological:     General: No focal deficit present.     Mental Status: She is alert and oriented to person, place,  and time.  Psychiatric:        Mood and Affect: Mood normal.        Behavior: Behavior normal.     LABORATORY DATA:  I have reviewed the labs as listed.  CBC    Component Value Date/Time   WBC 5.6 03/27/2022 1818   RBC 3.52 (L) 03/27/2022 1818   HGB 9.6 (L) 03/27/2022 1818   HCT 29.9 (L) 03/27/2022 1818   PLT 123 (L) 03/27/2022 1818   MCV 84.9 03/27/2022 1818   MCH 27.3 03/27/2022 1818   MCHC 32.1 03/27/2022 1818   RDW 18.8 (H) 03/27/2022 1818   LYMPHSABS 1.4 03/20/2022 1307   MONOABS 0.6 03/20/2022 1307   EOSABS 0.3 03/20/2022 1307   BASOSABS 0.0 03/20/2022 1307      Latest Ref Rng & Units 03/27/2022    4:01 AM 03/26/2022    3:13 AM 03/25/2022    3:54 AM  CMP  Glucose 70 - 99 mg/dL 143  160  144   BUN 8 - 23 mg/dL 28  25  25    Creatinine 0.44 - 1.00 mg/dL 1.20  1.19  1.29   Sodium 135 - 145 mmol/L 138  138  139   Potassium 3.5 - 5.1 mmol/L 3.8  3.5  3.4   Chloride 98 - 111 mmol/L 112  111  111   CO2 22 - 32 mmol/L 22  23  22    Calcium 8.9 - 10.3 mg/dL 8.1  8.0  8.3   Total Protein 6.5 - 8.1 g/dL  4.0  3.9   Total Bilirubin 0.3 - 1.2 mg/dL  1.7  1.9   Alkaline Phos 38 - 126 U/L  75  70   AST 15 - 41 U/L  37  37   ALT 0 - 44 U/L  20  19  DIAGNOSTIC IMAGING:  I have independently reviewed the relevant imaging and discussed with the patient.  ASSESSMENT & PLAN: 1.  Iron deficiency anemia - Suspected cause of anemia is due to chronic blood loss and malabsorption, as well as thalassemia (see below) - Colonoscopies in February 2022 & April 2022 showed grade III/IV nonbleeding internal hemorrhoids, polyps, and area of significantly congested mucosa - EGD in February 2022 & April 2022 showed portal hypertensive gastropathy and erythematous duodenopathy - Since her last visit, she has been hospitalized 4 times and received 16.5 units of blood in total. 01/27/2022 to 01/30/2022: Acute blood loss anemia secondary to lower GI bleed (hemorrhoids versus rectal varices), received  4 units PRBC during admission. 02/04/2022 to 02/10/2022: Acute blood loss anemia secondary to lower GI bleed (hemorrhoids versus rectal varices).  Received 4.5 units PRBC and IV iron during hospitalization.  Flexible sigmoidoscopy on 02/06/2022 showed prolapsed hemorrhoids and internal hemorrhoids.  Interventional radiology performed bilateral superior rectal artery particle and coil embolization on 02/07/2022. 03/05/2022 to 03/07/2022: Acute blood loss anemia secondary to lower GI bleed (hemorrhoids versus rectal varices).  Received 3 units PRBC during admission. 03/20/2022 to 03/28/2022: Acute blood loss anemia secondary to lower GI bleed (hemorrhoids versus rectal varices).  Hgb on 03/20/2022 was critically low at 3.9.  Received 5 units PRBC during hospitalization. - Following hospitalization in June 2023, hospitalist reached out to discuss possible hemolysis due to findings of low haptoglobin, elevated reticulocytes (5.6%), and elevated bilirubin (total bili 3.0, direct bili 1.0, indirect bili 2.0).  Discussed extensively with Dr. Delton Coombes, who felt that she may have some underlying degree of hemolysis from her beta thalassemia intermedia, although low haptoglobin may also be from cirrhosis with high reticulocytes from acute blood loss anemia.   -During hospitalizations, patient has developed recurrent AKI's with evolving CKD, most recent BMP (03/27/2022) with creatinine 1.20/GFR 51 - Most recent hematology panel (04/02/2022): Hgb 8.6/MCV 83.1, ferritin 470, iron saturation 39% with TIBC 177.  Creatinine 1.49/GFR 39 (CKD stage IIIb) previous labs showed normal copper, folate, and SPEP. - Intermittent rectal bleeding, occurring about once per week   - Chronic fatigue is somewhat worsened after recent hospital stays - Goal is to keep ferritin level around 200 (due to chronic GI blood loss and comorbid CKD); goal Hgb is 9.0-10.0 due to patient's underlying thalassemia.  Transfusion threshold is < 7.0. - PLAN: No IV  iron at this time. - Weekly CBC + BB sample with possible transfusion  - Repeat labs and RTC in 2 months - Due to developing CKD, we would also consider ESA in the future if patient had persistent anemia despite adequate iron stores.   2.  Vitamin B12 deficiency - Noted on 06/24/2021 to have normal vitamin B12 457, but with elevated methylmalonic acid 496 - Most recent labs (03/13/2022) with B12 750 and methylmalonic acid 540 - She is taking vitamin B12 supplement 500 mcg daily - PLAN: Continue vitamin B12 supplementation with B12 cyanocobalamin 500 mcg daily  - We will recheck B12/methylmalonic acid at follow-up visit in about 6 months (February 2024)  3.  Beta thalassemia intermedia - She reports that she has always been anemic, but was not diagnosed with thalassemia until around age 85.  Her father, sister, and brother all have thalassemia. - Her hemoglobin has been as low as 4.6, and she has received  only 1 blood transfusion in the past related to her rectal bleeding.  She has not required regular transfusion for her thalassemia. 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  Discussed extensively with Dr. Delton Coombes, who felt that she may have some underlying degree of hemolysis from her beta thalassemia intermedia, although low haptoglobin may also be from cirrhosis with high reticulocytes from acute blood loss anemia.   - Her highest hemoglobin is around 9-10, but her baseline hemoglobin usually runs from 7.0-8.0 - PLAN: We will transfuse as needed if  < 7.0 or severely symptomatic from anemia.   4.  Vitamin D deficiency - Vitamin D deficiency noted at 19.97 (06/24/2021) - Patient was started on vitamin D 1000 units daily  - Most recent vitamin D (03/13/2022) remains low at 23.09 - PLAN: We will start her on vitamin D 50,000 units weekly.  We will recheck vitamin D in 6 months (February 2024)  5.  History of uterine cancer - Diagnosed in 2010, she has total hysterectomy and bilateral oophorectomy - She did not require any chemotherapy or radiation - She was treated in [place], [place] - Her last CT imaging of her abdomen and pelvis showed no evidence of cancer recurrence   6.  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   All questions were answered. The patient knows to call the clinic with any problems, questions or concerns.  Medical decision making: Moderate  Time spent on visit: I spent 20 minutes counseling the patient face to face. The total time spent in the appointment was 30 minutes and more than 50% was on counseling.   Harriett Rush, PA-C  04/02/2022 1:31 PM

## 2022-04-02 ENCOUNTER — Other Ambulatory Visit: Payer: Self-pay

## 2022-04-02 ENCOUNTER — Inpatient Hospital Stay: Payer: 59

## 2022-04-02 ENCOUNTER — Inpatient Hospital Stay (HOSPITAL_BASED_OUTPATIENT_CLINIC_OR_DEPARTMENT_OTHER): Payer: 59 | Admitting: Physician Assistant

## 2022-04-02 VITALS — BP 105/41 | HR 69 | Temp 98.0°F | Resp 16 | Ht 69.69 in | Wt 248.5 lb

## 2022-04-02 DIAGNOSIS — D500 Iron deficiency anemia secondary to blood loss (chronic): Secondary | ICD-10-CM

## 2022-04-02 DIAGNOSIS — E538 Deficiency of other specified B group vitamins: Secondary | ICD-10-CM

## 2022-04-02 DIAGNOSIS — Z803 Family history of malignant neoplasm of breast: Secondary | ICD-10-CM | POA: Diagnosis not present

## 2022-04-02 DIAGNOSIS — Z90710 Acquired absence of both cervix and uterus: Secondary | ICD-10-CM | POA: Diagnosis not present

## 2022-04-02 DIAGNOSIS — Z8051 Family history of malignant neoplasm of kidney: Secondary | ICD-10-CM | POA: Diagnosis not present

## 2022-04-02 DIAGNOSIS — E1122 Type 2 diabetes mellitus with diabetic chronic kidney disease: Secondary | ICD-10-CM | POA: Diagnosis not present

## 2022-04-02 DIAGNOSIS — E559 Vitamin D deficiency, unspecified: Secondary | ICD-10-CM

## 2022-04-02 DIAGNOSIS — Z807 Family history of other malignant neoplasms of lymphoid, hematopoietic and related tissues: Secondary | ICD-10-CM | POA: Diagnosis not present

## 2022-04-02 DIAGNOSIS — N1832 Chronic kidney disease, stage 3b: Secondary | ICD-10-CM | POA: Diagnosis not present

## 2022-04-02 DIAGNOSIS — I129 Hypertensive chronic kidney disease with stage 1 through stage 4 chronic kidney disease, or unspecified chronic kidney disease: Secondary | ICD-10-CM | POA: Diagnosis not present

## 2022-04-02 DIAGNOSIS — D563 Thalassemia minor: Secondary | ICD-10-CM | POA: Diagnosis not present

## 2022-04-02 DIAGNOSIS — R188 Other ascites: Secondary | ICD-10-CM | POA: Diagnosis not present

## 2022-04-02 DIAGNOSIS — D561 Beta thalassemia: Secondary | ICD-10-CM | POA: Diagnosis not present

## 2022-04-02 DIAGNOSIS — Z8542 Personal history of malignant neoplasm of other parts of uterus: Secondary | ICD-10-CM | POA: Diagnosis not present

## 2022-04-02 DIAGNOSIS — K7460 Unspecified cirrhosis of liver: Secondary | ICD-10-CM | POA: Diagnosis not present

## 2022-04-02 DIAGNOSIS — R5382 Chronic fatigue, unspecified: Secondary | ICD-10-CM | POA: Diagnosis not present

## 2022-04-02 LAB — CBC WITH DIFFERENTIAL/PLATELET
Abs Immature Granulocytes: 0.01 10*3/uL (ref 0.00–0.07)
Basophils Absolute: 0 10*3/uL (ref 0.0–0.1)
Basophils Relative: 0 %
Eosinophils Absolute: 0.2 10*3/uL (ref 0.0–0.5)
Eosinophils Relative: 6 %
HCT: 26.5 % — ABNORMAL LOW (ref 36.0–46.0)
Hemoglobin: 8.6 g/dL — ABNORMAL LOW (ref 12.0–15.0)
Immature Granulocytes: 0 %
Lymphocytes Relative: 20 %
Lymphs Abs: 0.7 10*3/uL (ref 0.7–4.0)
MCH: 27 pg (ref 26.0–34.0)
MCHC: 32.5 g/dL (ref 30.0–36.0)
MCV: 83.1 fL (ref 80.0–100.0)
Monocytes Absolute: 0.3 10*3/uL (ref 0.1–1.0)
Monocytes Relative: 10 %
Neutro Abs: 2.2 10*3/uL (ref 1.7–7.7)
Neutrophils Relative %: 64 %
Platelets: 160 10*3/uL (ref 150–400)
RBC: 3.19 MIL/uL — ABNORMAL LOW (ref 3.87–5.11)
RDW: 21.6 % — ABNORMAL HIGH (ref 11.5–15.5)
Smear Review: NORMAL
WBC: 3.4 10*3/uL — ABNORMAL LOW (ref 4.0–10.5)
nRBC: 0 % (ref 0.0–0.2)

## 2022-04-02 LAB — IRON AND TIBC
Iron: 69 ug/dL (ref 28–170)
Saturation Ratios: 39 % — ABNORMAL HIGH (ref 10.4–31.8)
TIBC: 177 ug/dL — ABNORMAL LOW (ref 250–450)
UIBC: 108 ug/dL

## 2022-04-02 LAB — COMPREHENSIVE METABOLIC PANEL
ALT: 27 U/L (ref 0–44)
AST: 46 U/L — ABNORMAL HIGH (ref 15–41)
Albumin: 2.4 g/dL — ABNORMAL LOW (ref 3.5–5.0)
Alkaline Phosphatase: 136 U/L — ABNORMAL HIGH (ref 38–126)
Anion gap: 4 — ABNORMAL LOW (ref 5–15)
BUN: 38 mg/dL — ABNORMAL HIGH (ref 8–23)
CO2: 23 mmol/L (ref 22–32)
Calcium: 8.1 mg/dL — ABNORMAL LOW (ref 8.9–10.3)
Chloride: 110 mmol/L (ref 98–111)
Creatinine, Ser: 1.49 mg/dL — ABNORMAL HIGH (ref 0.44–1.00)
GFR, Estimated: 39 mL/min — ABNORMAL LOW (ref >60)
Glucose, Bld: 134 mg/dL — ABNORMAL HIGH (ref 70–99)
Potassium: 4.2 mmol/L (ref 3.5–5.1)
Sodium: 137 mmol/L (ref 135–145)
Total Bilirubin: 1.3 mg/dL — ABNORMAL HIGH (ref 0.3–1.2)
Total Protein: 5.1 g/dL — ABNORMAL LOW (ref 6.5–8.1)

## 2022-04-02 LAB — FERRITIN: Ferritin: 470 ng/mL — ABNORMAL HIGH (ref 11–307)

## 2022-04-02 LAB — SAMPLE TO BLOOD BANK

## 2022-04-02 MED ORDER — ERGOCALCIFEROL 1.25 MG (50000 UT) PO CAPS: 50000.0000 [IU] | ORAL_CAPSULE | ORAL | 3 refills | Status: DC

## 2022-04-02 NOTE — Patient Instructions (Signed)
[facility] **   You were seen today by Tarri Abernethy PA-C for your iron deficiency anemia.    IRON DEFICIENCY ANEMIA & THALASSEMIA: Your blood levels have improved, and you are within your baseline range of hemoglobin with your underlying thalassemia.  You do not need any IV iron at this time. In addition to losing blood from your rectal bleeding, your thalassemia can sometimes cause blood cells to break apart, which is called hemolysis. Due to your high risk of ongoing blood loss, we will schedule you for BLOOD CHECK once per week with blood transfusions as needed.  VITAMIN B-12 DEFICIENCY Continue taking vitamin B12 500 mcg daily  VITAMIN D DEFICIENCY Stop taking your daily vitamin D. New prescription has been sent to your pharmacy for vitamin D 50,000 units once per week.   FOLLOW-UP APPOINTMENT: Same-day labs and office visit in 2 months  ** Thank you for trusting me with your healthcare!  I strive to provide all of my patients with quality care at each visit.  If you receive a survey for this visit, I would be so grateful to you for taking the time to provide feedback.  Thank you in advance!  ~ Lyra Alaimo                   Dr. Derek Jack   &   Tarri Abernethy, PA-C   - - - - - - - - - - - - - - - - - -    Thank you for choosing [facility] to provide your oncology and hematology care.  To afford each patient quality time with our provider, please arrive at least 15 minutes before your scheduled appointment time.   If you have a lab appointment with the [facility] please come in thru the Main Entrance and check in at the main information desk.  You need to re-schedule your appointment should you arrive 10 or more minutes late.  We strive to give you quality time with our providers, and arriving late affects you and other patients whose appointments are  after yours.  Also, if you no show three or more times for appointments you may be dismissed from the clinic at the providers discretion.     Again, thank you for choosing [facility].  Our hope is that these requests will decrease the amount of time that you wait before being seen by our physicians.       _____________________________________________________________  Should you have questions after your visit to [facility], please contact our office at [phone] and follow the prompts.  Our office hours are 8:00 a.m. and 4:30 p.m. Monday - Friday.  Please note that voicemails left after 4:00 p.m. may not be returned until the following business day.  We are closed weekends and major holidays.  You do have access to a nurse 24-7, just call the main number to the clinic [phone] and do not press any options, hold on the line and a nurse will answer the phone.    For prescription refill requests, have your pharmacy contact our office and allow 72 hours.

## 2022-04-03 ENCOUNTER — Other Ambulatory Visit: Payer: Self-pay | Admitting: Interventional Radiology

## 2022-04-03 ENCOUNTER — Inpatient Hospital Stay: Payer: 59

## 2022-04-03 DIAGNOSIS — K643 Fourth degree hemorrhoids: Secondary | ICD-10-CM

## 2022-04-04 ENCOUNTER — Ambulatory Visit: Payer: Self-pay

## 2022-04-04 NOTE — Patient Outreach (Signed)
Care Coordination   Initial Visit Note   04/04/2022 Name: Rachel Huber MRN: [id number] DOB: 02/11/1956  Rachel Huber is a 66 y.o. year old female who sees [name], MD for primary care. I spoke with  Rachel Huber by phone today  What matters to the patients health and wellness today?  Weakness from GI bleed and management of her NASH    Goals Addressed             This Visit's Progress    RNCM: Effective Management of GI bleed/Anemia       Care Coordination Interventions: Evaluation of current treatment plan related to GI Bleed and Anemia and patient's adherence to plan as established by provider Advised patient to call the office for changes in condition, questions, or concerns Provided education to patient re: safety and falls prevention, notifying provider for worsening weakness or bleeding noted Reviewed medications with patient and discussed compliance. The patient is compliant with medications at this time. Denies any acute needs related to medications.  Discussed plans with patient for ongoing care management follow up and provided patient with direct contact information for care management team Advised patient to discuss changes in GI health, new onset of bleeding, falls or injuries,  with provider Screening for signs and symptoms of depression related to chronic disease state  Assessed social determinant of health barriers 04-04-2022: The patient states that she is supposed to start pt to help with strengthening due to weakness from GI bleed. Review of safety and fall prevention         RNCM: Effective Management of NASH       Care Coordination Interventions: Evaluation of current treatment plan related to NASH and patient's adherence to plan as established by provider Advised patient to To call the provider for changes in sx and sx, worsening condition, questions or concerns Provided education to patient re: NASH, treatment options, how to help with  dietary changes. The patient states she has had the tips procedure and that has help with the amount of fluid that has to be pulled off each week. She just found out last year that she had NASH. Education and support given Reviewed medications with patient and discussed patient is compliant with medications  Discussed plans with patient for ongoing care management follow up and provided patient with direct contact information for care management team Advised patient to discuss changes in her health and well being, questions or concerns with provider           SDOH assessments and interventions completed:  Yes  SDOH Interventions Today    Flowsheet Row Most Recent Value  SDOH Interventions   Food Insecurity Interventions Intervention Not Indicated  Financial Strain Interventions Intervention Not Indicated  Housing Interventions Intervention Not Indicated  Physical Activity Interventions Other (Comments)  [patient is going to start working with PT for strengthening from weakness]  Stress Interventions Intervention Not Indicated, Other (Comment)  [is concerned about her health and well being with gi bleed and NASH]  Social Connections Interventions Other (Comment), Intervention Not Indicated  [Has good support system]  Transportation Interventions Intervention Not Indicated        Care Coordination Interventions Activated:  Yes  Care Coordination Interventions:  Yes, provided   Follow up plan: Follow up call scheduled for 06-06-2022 at 1 pm    Encounter Outcome:  Pt. Visit Completed   Noreene Larsson RN, MSN, [facility] Mobile:  [phone]   

## 2022-04-04 NOTE — Patient Instructions (Signed)
Visit Information  Thank you for taking time to visit with me today. Please don't hesitate to contact me if I can be of assistance to you.   Following are the goals we discussed today:   Goals Addressed             This Visit's Progress    RNCM: Effective Management of GI bleed/Anemia       Care Coordination Interventions: Evaluation of current treatment plan related to GI Bleed and Anemia and patient's adherence to plan as established by provider Advised patient to call the office for changes in condition, questions, or concerns Provided education to patient re: safety and falls prevention, notifying provider for worsening weakness or bleeding noted Reviewed medications with patient and discussed compliance. The patient is compliant with medications at this time. Denies any acute needs related to medications.  Discussed plans with patient for ongoing care management follow up and provided patient with direct contact information for care management team Advised patient to discuss changes in GI health, new onset of bleeding, falls or injuries,  with provider Screening for signs and symptoms of depression related to chronic disease state  Assessed social determinant of health barriers 04-04-2022: The patient states that she is supposed to start pt to help with strengthening due to weakness from GI bleed. Review of safety and fall prevention         RNCM: Effective Management of NASH       Care Coordination Interventions: Evaluation of current treatment plan related to NASH and patient's adherence to plan as established by provider Advised patient to To call the provider for changes in sx and sx, worsening condition, questions or concerns Provided education to patient re: NASH, treatment options, how to help with dietary changes. The patient states she has had the tips procedure and that has help with the amount of fluid that has to be pulled off each week. She just found out last year that  she had NASH. Education and support given Reviewed medications with patient and discussed patient is compliant with medications  Discussed plans with patient for ongoing care management follow up and provided patient with direct contact information for care management team Advised patient to discuss changes in her health and well being, questions or concerns with provider           Our next appointment is by telephone on 06-06-2022 at 1 pm  Please call the care guide team at [phone] if you need to cancel or reschedule your appointment.   If you are experiencing a Mental Health or [place] or need someone to talk to, please call the Suicide and Crisis Lifeline: 988 call the [place] National Suicide Prevention Lifeline: 339-054-5457 or TTY: (785) 757-3446 TTY 202-576-7949) to talk to a trained counselor call 1-800-273-TALK (toll free, 24 hour hotline)  Patient verbalizes understanding of instructions and care plan provided today and agrees to view in [place]. Active MyChart status and patient understanding of how to access instructions and care plan via MyChart confirmed with patient.     Telephone follow up appointment with care management team member scheduled for: 06-06-2022 at 1 pm  Noreene Larsson RN, MSN, [facility] Mobile: [phone]    Cirrhosis  Cirrhosis is long-term (chronic) liver injury. The liver is the body's largest internal organ, and it performs many functions. It converts food into energy, removes toxic material from the blood, makes important proteins, and absorbs necessary vitamins from  food. In cirrhosis, healthy liver cells are replaced by scar tissue. This prevents blood from flowing through the liver and makes it difficult for the liver to complete its functions. What are the causes? Common causes of this condition are hepatitis C and long-term alcohol abuse. Other causes  include: Nonalcoholic fatty liver disease (NAFLD). This happens when fat is deposited in the liver by causes other than alcohol. Hepatitis B infection. Autoimmune hepatitis. In this condition, the body's defense system (immune system) mistakenly attacks the liver cells, causing inflammation. Diseases that cause blockage of ducts inside the liver. Inherited liver diseases, such as hemochromatosis. This is one of the most common inherited liver diseases. In this disease, deposits of iron collect in the liver and other organs. Reactions to certain long-term medicines, such as amiodarone, a heart medicine. Parasitic infections. These include schistosomiasis, which is caused by a flatworm. Long-term contact to certain toxins. These toxins include certain organic solvents, such as toluene and chloroform. What increases the risk? You are more likely to develop this condition if: You have certain types of viral hepatitis. You abuse alcohol, especially if you are female. You are overweight. You use IV drugs and share needles. You have unprotected sex with someone who has viral hepatitis. What are the signs or symptoms? You may not have any signs and symptoms at first. Symptoms may not develop until the damage to your liver starts to get worse. Early symptoms may include: Weakness and tiredness (fatigue). Changes in sleep patterns or having trouble sleeping. Itchiness. Tenderness in the right-upper part of your abdomen. Weight loss and muscle loss. Nausea. Loss of appetite. Later symptoms may include: Fatigue or weakness that is getting worse. Yellow skin and eyes (jaundice). Buildup of fluid in the abdomen (ascites). You may notice that your clothes are tight around your waist. Weight gain and swelling of the feet and ankles (edema). Trouble breathing. Easy bruising and bleeding. Vomiting blood, or black or bloody stool. Mental confusion. How is this diagnosed? Your health care provider  may suspect cirrhosis based on your symptoms and medical history, especially if you have other medical conditions or a history of alcohol abuse. Your health care provider will do a physical exam to feel your liver and to check for signs of cirrhosis. Tests may include: Blood tests to check: For hepatitis B or C. Kidney function. Liver function. Imaging tests such as: MRI or CT scan to look for changes seen in advanced cirrhosis. Ultrasound to see if normal liver tissue is being replaced by scar tissue. A procedure in which a long needle is used to take a sample of liver tissue to be checked in a lab (biopsy). Liver biopsy can confirm the diagnosis of cirrhosis. How is this treated? Treatment for this condition depends on how damaged your liver is and what caused the damage. It may include treating the symptoms of cirrhosis, or treating the underlying causes to slow the damage. Treatment may include: Making lifestyle changes, such as: Eating a healthy diet. You may need to work with your health care provider or a dietitian to develop an eating plan. Restricting salt intake. Maintaining a healthy weight. Not abusing drugs or alcohol. Taking medicines to: Treat liver infections or other infections. Control itching. Reduce fluid buildup. Reduce certain blood toxins. Reduce risk of bleeding from enlarged blood vessels in the stomach or esophagus (varices). Liver transplant. In this procedure, a liver from a donor is used to replace your diseased liver. This is done if cirrhosis has caused  liver failure. Other treatments and procedures may be done depending on the problems that you get from cirrhosis. Common problems include liver-related kidney failure (hepatorenal syndrome). Follow these instructions at home:  Take medicines only as told by your health care provider. Do not use medicines that are toxic to your liver. Ask your health care provider before taking any new medicines, including  over-the-counter medicines such as NSAIDs. Rest as needed. Eat a well-balanced diet. Limit your salt or water intake, if your health care provider asks you to do this. Do not drink alcohol. This is especially important if you routinely take acetaminophen. Keep all follow-up visits. This is important. Contact a health care provider if you: Have fatigue or weakness that is getting worse. Develop swelling of the hands, feet, or legs, or a buildup of fluid in the abdomen (ascites). Have a fever or chills. Develop loss of appetite. Have nausea or vomiting. Develop jaundice. Develop easy bruising or bleeding. Get help right away if you: Vomit bright red blood or a material that looks like coffee grounds. Have blood in your stools. Notice that your stools appear black and tarry. Become confused. Have chest pain or trouble breathing. These symptoms may represent a serious problem that is an emergency. Do not wait to see if the symptoms will go away. Get medical help right away. Call your local emergency services (911 in the [place] Do not drive yourself to the hospital. Summary Cirrhosis is chronic liver injury. Common causes are hepatitis C and long-term alcohol abuse. Tests used to diagnose cirrhosis include blood tests, imaging tests, and liver biopsy. Treatment for this condition involves treating the underlying cause. Avoid alcohol, drugs, salt, and medicines that may damage your liver. Get help right away if you vomit bright red blood or a material that looks like coffee grounds. This information is not intended to replace advice given to you by your health care provider. Make sure you discuss any questions you have with your health care provider. Document Revised: 05/17/2020 Document Reviewed: 05/17/2020 Elsevier Patient Education  [place].   Weakness Weakness is a lack of strength. You may feel weak all over your body (generalized), or you may feel weak in one part of your  body (focal). Common causes of weakness include: Infection and disorders of the body's defense system (immune system). Physical exhaustion. Internal bleeding or other blood loss that results in a lack of red blood cells (anemia). Dehydration. An imbalance in mineral (electrolyte) levels, such as potassium. Chronic kidney or liver disease. Cancer. Other causes include: Some medicines or cancer treatment. Stress, anxiety, or depression. Heart disease, circulation problems, or stroke. Nervous system disorders. Thyroid disorders. Loss of muscle strength because of age or inactivity. Poor sleep quality or sleep disorders. The cause of your weakness may not be known. Some causes of weakness can be serious, so it is important to see your health care provider. Follow these instructions at home: Activity Rest as needed. Try to get enough sleep. Most adults need 7-8 hours of quality sleep each night. Talk to your health care provider about how much sleep you need. Do exercises, such as arm curls and leg raises, for 30 minutes at least 2 days a week or as told by your health care provider. This helps build muscle strength. Consider working with a physical therapist or trainer who can develop an exercise plan to help you gain muscle strength. General instructions  Take over-the-counter and prescription medicines only as told by your health care  provider. Eat a healthy, well-balanced diet. This includes: Proteins to build muscles, such as lean meats and fish. Fresh fruits and vegetables. Carbohydrates to boost energy, such as whole grains. Drink enough fluid to keep your urine pale yellow. Keep all follow-up visits. This is important. Contact a health care provider if: Your weakness does not improve or gets worse. Your weakness affects your ability to think clearly. Your weakness affects your ability to do your normal daily activities. Get help right away if: You develop sudden weakness,  especially on one side of your face or body. You have chest pain. You have trouble breathing or shortness of breath. You have problems with your vision. You have trouble talking or swallowing. You have trouble standing or walking. You are light-headed or lose consciousness. These symptoms may be an emergency. Get help right away. Call 911. Do not wait to see if the symptoms will go away. Do not drive yourself to the hospital. Summary Weakness is a lack of strength. You may feel weak all over your body or just in one specific part of your body. Weakness can be caused by a variety of things. In some cases, the cause may be unknown. Rest as needed, and try to get enough sleep. Most adults need 7-8 hours of quality sleep each night. Eat a healthy, well-balanced diet. This information is not intended to replace advice given to you by your health care provider. Make sure you discuss any questions you have with your health care provider. Document Revised: 07/07/2021 Document Reviewed: 07/07/2021 Elsevier Patient Education  [place].

## 2022-04-08 ENCOUNTER — Other Ambulatory Visit: Payer: Self-pay | Admitting: Gastroenterology

## 2022-04-08 ENCOUNTER — Ambulatory Visit
Admission: RE | Admit: 2022-04-08 | Discharge: 2022-04-08 | Disposition: A | Discharge status: Home / Self Care | Payer: 59 | Source: Ambulatory Visit | Attending: Gastroenterology | Admitting: Gastroenterology

## 2022-04-08 DIAGNOSIS — K7460 Unspecified cirrhosis of liver: Secondary | ICD-10-CM

## 2022-04-08 DIAGNOSIS — R188 Other ascites: Secondary | ICD-10-CM | POA: Diagnosis not present

## 2022-04-08 DIAGNOSIS — K7581 Nonalcoholic steatohepatitis (NASH): Secondary | ICD-10-CM | POA: Insufficient documentation

## 2022-04-08 MED ORDER — ALBUMIN HUMAN 25 % IV SOLN: 25.0000 g | Freq: Once | INTRAVENOUS | Status: AC

## 2022-04-08 MED ORDER — ALBUMIN HUMAN 25 % IV SOLN
INTRAVENOUS | Status: AC
  Administered 2022-04-08: 25 g via INTRAVENOUS
  Filled 2022-04-08: qty 100

## 2022-04-08 NOTE — Procedures (Signed)
PROCEDURE SUMMARY:  Successful US guided therapeutic paracentesis from RLQ Yielded 6.6 L of clear, yellow fluid.  No immediate complications.  Pt tolerated well.   Specimen not sent for labs.  EBL < 1 mL  Tyson Alias, AGNP 04/08/2022 12:12 PM

## 2022-04-09 ENCOUNTER — Inpatient Hospital Stay: Payer: 59

## 2022-04-09 DIAGNOSIS — E559 Vitamin D deficiency, unspecified: Secondary | ICD-10-CM | POA: Diagnosis not present

## 2022-04-09 DIAGNOSIS — R188 Other ascites: Secondary | ICD-10-CM | POA: Diagnosis not present

## 2022-04-09 DIAGNOSIS — D500 Iron deficiency anemia secondary to blood loss (chronic): Secondary | ICD-10-CM | POA: Diagnosis not present

## 2022-04-09 DIAGNOSIS — Z8542 Personal history of malignant neoplasm of other parts of uterus: Secondary | ICD-10-CM | POA: Diagnosis not present

## 2022-04-09 DIAGNOSIS — R5382 Chronic fatigue, unspecified: Secondary | ICD-10-CM | POA: Diagnosis not present

## 2022-04-09 DIAGNOSIS — D561 Beta thalassemia: Secondary | ICD-10-CM | POA: Diagnosis not present

## 2022-04-09 DIAGNOSIS — Z807 Family history of other malignant neoplasms of lymphoid, hematopoietic and related tissues: Secondary | ICD-10-CM | POA: Diagnosis not present

## 2022-04-09 DIAGNOSIS — K7460 Unspecified cirrhosis of liver: Secondary | ICD-10-CM | POA: Diagnosis not present

## 2022-04-09 DIAGNOSIS — E1122 Type 2 diabetes mellitus with diabetic chronic kidney disease: Secondary | ICD-10-CM | POA: Diagnosis not present

## 2022-04-09 DIAGNOSIS — Z803 Family history of malignant neoplasm of breast: Secondary | ICD-10-CM | POA: Diagnosis not present

## 2022-04-09 DIAGNOSIS — I129 Hypertensive chronic kidney disease with stage 1 through stage 4 chronic kidney disease, or unspecified chronic kidney disease: Secondary | ICD-10-CM | POA: Diagnosis not present

## 2022-04-09 DIAGNOSIS — Z8051 Family history of malignant neoplasm of kidney: Secondary | ICD-10-CM | POA: Diagnosis not present

## 2022-04-09 DIAGNOSIS — E538 Deficiency of other specified B group vitamins: Secondary | ICD-10-CM | POA: Diagnosis not present

## 2022-04-09 DIAGNOSIS — N1832 Chronic kidney disease, stage 3b: Secondary | ICD-10-CM | POA: Diagnosis not present

## 2022-04-09 DIAGNOSIS — Z90710 Acquired absence of both cervix and uterus: Secondary | ICD-10-CM | POA: Diagnosis not present

## 2022-04-09 LAB — CBC
HCT: 24.9 % — ABNORMAL LOW (ref 36.0–46.0)
Hemoglobin: 7.8 g/dL — ABNORMAL LOW (ref 12.0–15.0)
MCH: 25.6 pg — ABNORMAL LOW (ref 26.0–34.0)
MCHC: 31.3 g/dL (ref 30.0–36.0)
MCV: 81.6 fL (ref 80.0–100.0)
Platelets: 176 10*3/uL (ref 150–400)
RBC: 3.05 MIL/uL — ABNORMAL LOW (ref 3.87–5.11)
RDW: 21.9 % — ABNORMAL HIGH (ref 11.5–15.5)
WBC: 3.7 10*3/uL — ABNORMAL LOW (ref 4.0–10.5)
nRBC: 0 % (ref 0.0–0.2)

## 2022-04-09 LAB — SAMPLE TO BLOOD BANK

## 2022-04-10 ENCOUNTER — Inpatient Hospital Stay: Payer: 59

## 2022-04-10 DIAGNOSIS — Z09 Encounter for follow-up examination after completed treatment for conditions other than malignant neoplasm: Secondary | ICD-10-CM | POA: Diagnosis not present

## 2022-04-10 DIAGNOSIS — K625 Hemorrhage of anus and rectum: Secondary | ICD-10-CM | POA: Diagnosis not present

## 2022-04-10 DIAGNOSIS — D563 Thalassemia minor: Secondary | ICD-10-CM | POA: Diagnosis not present

## 2022-04-10 DIAGNOSIS — K766 Portal hypertension: Secondary | ICD-10-CM | POA: Diagnosis not present

## 2022-04-10 DIAGNOSIS — K7469 Other cirrhosis of liver: Secondary | ICD-10-CM | POA: Diagnosis not present

## 2022-04-15 ENCOUNTER — Ambulatory Visit
Admission: RE | Admit: 2022-04-15 | Discharge: 2022-04-15 | Disposition: A | Discharge status: Home / Self Care | Payer: 59 | Source: Ambulatory Visit | Attending: Gastroenterology | Admitting: Gastroenterology

## 2022-04-15 DIAGNOSIS — K7460 Unspecified cirrhosis of liver: Secondary | ICD-10-CM | POA: Diagnosis not present

## 2022-04-15 DIAGNOSIS — R188 Other ascites: Secondary | ICD-10-CM | POA: Diagnosis not present

## 2022-04-15 DIAGNOSIS — K7581 Nonalcoholic steatohepatitis (NASH): Secondary | ICD-10-CM | POA: Diagnosis present

## 2022-04-15 MED ORDER — ALBUMIN HUMAN 25 % IV SOLN
25.0000 g | Freq: Once | INTRAVENOUS | Status: AC
  Administered 2022-04-15: 25 g via INTRAVENOUS

## 2022-04-15 MED ORDER — ALBUMIN HUMAN 25 % IV SOLN
INTRAVENOUS | Status: AC
  Filled 2022-04-15: qty 100

## 2022-04-15 NOTE — Procedures (Signed)
PROCEDURE SUMMARY:  Successful US guided paracentesis from right lower quadrant.  Yielded 7.2 L of clear yellow fluid.  No immediate complications.  Pt tolerated well.   Specimen not sent for labs.  EBL < 2 mL  Theresa Duty, NP 04/15/2022 12:19 PM   Patient is s/p TIPS and TIPS revision at [facility]. Currently still followed by [facility].

## 2022-04-16 ENCOUNTER — Inpatient Hospital Stay: Payer: 59

## 2022-04-16 DIAGNOSIS — I129 Hypertensive chronic kidney disease with stage 1 through stage 4 chronic kidney disease, or unspecified chronic kidney disease: Secondary | ICD-10-CM | POA: Diagnosis not present

## 2022-04-16 DIAGNOSIS — D563 Thalassemia minor: Secondary | ICD-10-CM

## 2022-04-16 DIAGNOSIS — D561 Beta thalassemia: Secondary | ICD-10-CM | POA: Diagnosis not present

## 2022-04-16 DIAGNOSIS — R188 Other ascites: Secondary | ICD-10-CM | POA: Diagnosis not present

## 2022-04-16 DIAGNOSIS — Z8051 Family history of malignant neoplasm of kidney: Secondary | ICD-10-CM | POA: Diagnosis not present

## 2022-04-16 DIAGNOSIS — Z807 Family history of other malignant neoplasms of lymphoid, hematopoietic and related tissues: Secondary | ICD-10-CM | POA: Diagnosis not present

## 2022-04-16 DIAGNOSIS — Z90710 Acquired absence of both cervix and uterus: Secondary | ICD-10-CM | POA: Diagnosis not present

## 2022-04-16 DIAGNOSIS — N1832 Chronic kidney disease, stage 3b: Secondary | ICD-10-CM | POA: Diagnosis not present

## 2022-04-16 DIAGNOSIS — R5382 Chronic fatigue, unspecified: Secondary | ICD-10-CM | POA: Diagnosis not present

## 2022-04-16 DIAGNOSIS — E538 Deficiency of other specified B group vitamins: Secondary | ICD-10-CM | POA: Diagnosis not present

## 2022-04-16 DIAGNOSIS — E559 Vitamin D deficiency, unspecified: Secondary | ICD-10-CM | POA: Diagnosis not present

## 2022-04-16 DIAGNOSIS — Z8542 Personal history of malignant neoplasm of other parts of uterus: Secondary | ICD-10-CM | POA: Diagnosis not present

## 2022-04-16 DIAGNOSIS — K7460 Unspecified cirrhosis of liver: Secondary | ICD-10-CM | POA: Diagnosis not present

## 2022-04-16 DIAGNOSIS — D500 Iron deficiency anemia secondary to blood loss (chronic): Secondary | ICD-10-CM | POA: Diagnosis not present

## 2022-04-16 DIAGNOSIS — Z803 Family history of malignant neoplasm of breast: Secondary | ICD-10-CM | POA: Diagnosis not present

## 2022-04-16 DIAGNOSIS — E1122 Type 2 diabetes mellitus with diabetic chronic kidney disease: Secondary | ICD-10-CM | POA: Diagnosis not present

## 2022-04-16 LAB — CBC
HCT: 24.5 % — ABNORMAL LOW (ref 36.0–46.0)
Hemoglobin: 7.6 g/dL — ABNORMAL LOW (ref 12.0–15.0)
MCH: 24.9 pg — ABNORMAL LOW (ref 26.0–34.0)
MCHC: 31 g/dL (ref 30.0–36.0)
MCV: 80.3 fL (ref 80.0–100.0)
Platelets: 156 10*3/uL (ref 150–400)
RBC: 3.05 MIL/uL — ABNORMAL LOW (ref 3.87–5.11)
RDW: 22.3 % — ABNORMAL HIGH (ref 11.5–15.5)
WBC: 4.4 10*3/uL (ref 4.0–10.5)
nRBC: 0 % (ref 0.0–0.2)

## 2022-04-16 LAB — SAMPLE TO BLOOD BANK

## 2022-04-16 LAB — ACID FAST CULTURE WITH REFLEXED SENSITIVITIES (MYCOBACTERIA): Acid Fast Culture: NEGATIVE

## 2022-04-17 ENCOUNTER — Inpatient Hospital Stay: Payer: 59

## 2022-04-22 ENCOUNTER — Ambulatory Visit
Admission: RE | Admit: 2022-04-22 | Discharge: 2022-04-22 | Disposition: A | Discharge status: Home / Self Care | Payer: 59 | Source: Ambulatory Visit | Attending: Gastroenterology | Admitting: Gastroenterology

## 2022-04-22 DIAGNOSIS — K7460 Unspecified cirrhosis of liver: Secondary | ICD-10-CM | POA: Diagnosis not present

## 2022-04-22 DIAGNOSIS — K7581 Nonalcoholic steatohepatitis (NASH): Secondary | ICD-10-CM | POA: Diagnosis not present

## 2022-04-22 DIAGNOSIS — R188 Other ascites: Secondary | ICD-10-CM | POA: Diagnosis not present

## 2022-04-22 MED ORDER — ALBUMIN HUMAN 25 % IV SOLN: 25.0000 g | Freq: Once | INTRAVENOUS | Status: DC

## 2022-04-22 MED ORDER — ALBUMIN HUMAN 25 % IV SOLN
INTRAVENOUS | Status: AC
  Administered 2022-04-22: 25 g via INTRAVENOUS
  Filled 2022-04-22: qty 100

## 2022-04-22 MED ORDER — ALBUMIN HUMAN 25 % IV SOLN: 25.0000 g | Freq: Once | INTRAVENOUS | Status: AC

## 2022-04-22 NOTE — Procedures (Signed)
PROCEDURE SUMMARY:  Successful US guided therapeutic paracentesis from RUQ.  Yielded 6.7 L of clear, yellow fluid.  No immediate complications.  Pt tolerated well.   Specimen not sent for labs.  EBL < 1 mL  Tyson Alias, AGNP 04/22/2022 12:31 PM

## 2022-04-23 ENCOUNTER — Other Ambulatory Visit: Payer: Self-pay | Admitting: Physician Assistant

## 2022-04-23 ENCOUNTER — Inpatient Hospital Stay: Payer: 59

## 2022-04-23 DIAGNOSIS — D500 Iron deficiency anemia secondary to blood loss (chronic): Secondary | ICD-10-CM | POA: Insufficient documentation

## 2022-04-23 DIAGNOSIS — E559 Vitamin D deficiency, unspecified: Secondary | ICD-10-CM | POA: Insufficient documentation

## 2022-04-23 DIAGNOSIS — D563 Thalassemia minor: Secondary | ICD-10-CM

## 2022-04-23 DIAGNOSIS — R2689 Other abnormalities of gait and mobility: Secondary | ICD-10-CM | POA: Diagnosis present

## 2022-04-23 DIAGNOSIS — E538 Deficiency of other specified B group vitamins: Secondary | ICD-10-CM | POA: Insufficient documentation

## 2022-04-23 DIAGNOSIS — D649 Anemia, unspecified: Secondary | ICD-10-CM

## 2022-04-23 DIAGNOSIS — K7460 Unspecified cirrhosis of liver: Secondary | ICD-10-CM | POA: Diagnosis present

## 2022-04-23 DIAGNOSIS — R262 Difficulty in walking, not elsewhere classified: Secondary | ICD-10-CM | POA: Diagnosis present

## 2022-04-23 DIAGNOSIS — M6281 Muscle weakness (generalized): Secondary | ICD-10-CM | POA: Diagnosis not present

## 2022-04-23 LAB — CBC
HCT: 26 % — ABNORMAL LOW (ref 36.0–46.0)
Hemoglobin: 7.9 g/dL — ABNORMAL LOW (ref 12.0–15.0)
MCH: 23.8 pg — ABNORMAL LOW (ref 26.0–34.0)
MCHC: 30.4 g/dL (ref 30.0–36.0)
MCV: 78.3 fL — ABNORMAL LOW (ref 80.0–100.0)
Platelets: 136 10*3/uL — ABNORMAL LOW (ref 150–400)
RBC: 3.32 MIL/uL — ABNORMAL LOW (ref 3.87–5.11)
RDW: 22.2 % — ABNORMAL HIGH (ref 11.5–15.5)
WBC: 3.7 10*3/uL — ABNORMAL LOW (ref 4.0–10.5)
nRBC: 0.5 % — ABNORMAL HIGH (ref 0.0–0.2)

## 2022-04-23 LAB — TYPE AND SCREEN
ABO/RH(D): A POS
Antibody Screen: NEGATIVE

## 2022-04-24 ENCOUNTER — Inpatient Hospital Stay: Payer: 59

## 2022-04-29 ENCOUNTER — Other Ambulatory Visit: Payer: 59

## 2022-04-29 ENCOUNTER — Other Ambulatory Visit: Payer: Self-pay | Admitting: Gastroenterology

## 2022-04-29 ENCOUNTER — Ambulatory Visit
Admission: RE | Admit: 2022-04-29 | Discharge: 2022-04-29 | Disposition: A | Discharge status: Home / Self Care | Payer: 59 | Source: Ambulatory Visit | Attending: Gastroenterology | Admitting: Gastroenterology

## 2022-04-29 DIAGNOSIS — K7460 Unspecified cirrhosis of liver: Secondary | ICD-10-CM

## 2022-04-29 DIAGNOSIS — K7581 Nonalcoholic steatohepatitis (NASH): Secondary | ICD-10-CM | POA: Diagnosis present

## 2022-04-29 DIAGNOSIS — K7469 Other cirrhosis of liver: Secondary | ICD-10-CM | POA: Diagnosis not present

## 2022-04-29 DIAGNOSIS — R188 Other ascites: Secondary | ICD-10-CM | POA: Diagnosis not present

## 2022-04-29 MED ORDER — ALBUMIN HUMAN 25 % IV SOLN
INTRAVENOUS | Status: AC
  Administered 2022-04-29: 25 g via INTRAVENOUS
  Filled 2022-04-29: qty 100

## 2022-04-29 MED ORDER — ALBUMIN HUMAN 25 % IV SOLN: 25.0000 g | Freq: Once | INTRAVENOUS | Status: AC

## 2022-04-29 MED ORDER — ALBUMIN HUMAN 25 % IV SOLN
INTRAVENOUS | Status: AC
  Filled 2022-04-29: qty 100

## 2022-04-29 MED ORDER — ALBUMIN HUMAN 25 % IV SOLN: 25.0000 g | Freq: Once | INTRAVENOUS | Status: DC

## 2022-04-29 NOTE — Procedures (Signed)
PROCEDURE SUMMARY:  Successful US guided therapeutic paracentesis from RUQ.  Yielded 7.2 L of clear, yellow fluid.  No immediate complications.  Pt tolerated well.   Specimen not sent for labs.  EBL < 1 mL  Tyson Alias, AGNP 04/29/2022 12:56 PM

## 2022-04-30 ENCOUNTER — Other Ambulatory Visit: Payer: Self-pay

## 2022-04-30 ENCOUNTER — Inpatient Hospital Stay: Payer: 59

## 2022-04-30 ENCOUNTER — Other Ambulatory Visit: Payer: Self-pay | Admitting: Gastroenterology

## 2022-04-30 DIAGNOSIS — K7460 Unspecified cirrhosis of liver: Secondary | ICD-10-CM

## 2022-04-30 DIAGNOSIS — M6281 Muscle weakness (generalized): Secondary | ICD-10-CM | POA: Diagnosis not present

## 2022-04-30 DIAGNOSIS — D500 Iron deficiency anemia secondary to blood loss (chronic): Secondary | ICD-10-CM

## 2022-04-30 LAB — CBC
HCT: 18.4 % — ABNORMAL LOW (ref 36.0–46.0)
Hemoglobin: 5.6 g/dL — CL (ref 12.0–15.0)
MCH: 23 pg — ABNORMAL LOW (ref 26.0–34.0)
MCHC: 30.4 g/dL (ref 30.0–36.0)
MCV: 75.7 fL — ABNORMAL LOW (ref 80.0–100.0)
Platelets: 130 10*3/uL — ABNORMAL LOW (ref 150–400)
RBC: 2.43 MIL/uL — ABNORMAL LOW (ref 3.87–5.11)
RDW: 21.6 % — ABNORMAL HIGH (ref 11.5–15.5)
WBC: 3.8 10*3/uL — ABNORMAL LOW (ref 4.0–10.5)
nRBC: 0.5 % — ABNORMAL HIGH (ref 0.0–0.2)

## 2022-04-30 LAB — PREPARE RBC (CROSSMATCH)

## 2022-04-30 LAB — SAMPLE TO BLOOD BANK

## 2022-04-30 NOTE — Progress Notes (Unsigned)
CRITICAL VALUE ALERT Critical value received:  hemoglobin 5.6 Date of notification:  04-30-22 Time of notification: 1358 Critical value read back:  Yes.   Nurse who received alert:  C. Keir Foland RN MD notified time and response:  [id number], Tarri Abernethy PA-C.  Advised patient to go to the Emergency room.   Nurse called pt to report hemoglobin of 5.6. Advised pt to go to the emergency room but patient is refusing. Patient states she has had 3-4 bloody stools over the past week and states its her hemorrhoid.  Tarri Abernethy PA-C notified, per provider, ok to bring in to clinic tomorrow for blood transfusion, 2 units.

## 2022-05-01 ENCOUNTER — Ambulatory Visit (HOSPITAL_COMMUNITY): Payer: 59 | Attending: Gastroenterology | Admitting: Physical Therapy

## 2022-05-01 ENCOUNTER — Inpatient Hospital Stay: Payer: 59

## 2022-05-01 DIAGNOSIS — K7460 Unspecified cirrhosis of liver: Secondary | ICD-10-CM | POA: Insufficient documentation

## 2022-05-01 DIAGNOSIS — D500 Iron deficiency anemia secondary to blood loss (chronic): Secondary | ICD-10-CM

## 2022-05-01 DIAGNOSIS — R2689 Other abnormalities of gait and mobility: Secondary | ICD-10-CM | POA: Insufficient documentation

## 2022-05-01 DIAGNOSIS — R262 Difficulty in walking, not elsewhere classified: Secondary | ICD-10-CM | POA: Insufficient documentation

## 2022-05-01 DIAGNOSIS — M6281 Muscle weakness (generalized): Secondary | ICD-10-CM | POA: Insufficient documentation

## 2022-05-01 MED ORDER — SODIUM CHLORIDE 0.9% FLUSH: 10.0000 mL | INTRAVENOUS | Status: DC | PRN

## 2022-05-01 MED ORDER — SODIUM CHLORIDE 0.9% IV SOLUTION
250.0000 mL | Freq: Once | INTRAVENOUS | Status: AC
  Administered 2022-05-01: 250 mL via INTRAVENOUS

## 2022-05-01 NOTE — Progress Notes (Signed)
Pt presents today for 2 units of blood per provider's order. Vital signs stable and pt voiced no new complaints at this time.  Peripheral IV started with good blood return pre and post infusion.  2 units of blood given today per MD orders. Tolerated infusion without adverse affects. Vital signs stable. No complaints at this time. Discharged from clinic via wheelchair in stable condition. Alert and oriented x 3. F/U with [facility] as scheduled.

## 2022-05-01 NOTE — Therapy (Signed)
OUTPATIENT PHYSICAL THERAPY LOWER EXTREMITY EVALUATION   Patient Name: Rachel Huber MRN: [id number] DOB:1955/11/23, 66 y.o., female Today's Date: 05/01/2022   PT End of Session - 05/01/22 1311     Visit Number 1    Number of Visits 18    Date for PT Re-Evaluation 06/26/22    Authorization Type [employer] (no VL)    Progress Note Due on Visit 10    PT Start Time 1307    PT Stop Time 1343    PT Time Calculation (min) 36 min    Activity Tolerance Patient tolerated treatment well    Behavior During Therapy WFL for tasks assessed/performed             Past Medical History:  Diagnosis Date   Anxiety    Arthritis    Cirrhosis of liver ([place])    Diabetes mellitus without complication ([place])    Dyspnea    DOE   GERD (gastroesophageal reflux disease)    Hepatitis    PAST   Hypertension    Neuropathy    Neuropathy, diabetic ([place])    Pneumonia    Sinus complaint    Sleep apnea    CPAP   Thalassemia minor 1992   Past Surgical History:  Procedure Laterality Date   ABDOMINAL HYSTERECTOMY     BREAST BIOPSY Right 02/15/2018   [place] bx 6-6:30 ribbon shape, ONE CORE FRAGMENT WITH FIBROSIS. ONE CORE FRAGMENT WITH PORTION OF A DILATED   BREAST BIOPSY Right 02/15/2018   [place] bx 9:00 heart shape, USUAL DUCTAL HYPERPLASIA   BREAST LUMPECTOMY Right 03/09/2018   Procedure: BREAST LUMPECTOMY x 2;  Surgeon: Benjamine Sprague, DO;  Location: [facility] ORS;  Service: General;  Laterality: Right;   CATARACT EXTRACTION W/PHACO Left 06/11/2016   Procedure: CATARACT EXTRACTION PHACO AND INTRAOCULAR LENS PLACEMENT (IOC);  Surgeon: Estill Cotta, MD;  Location: [facility];  Service: Ophthalmology;  Laterality: Left;  Lot # [id number] H US:01:38.6 AP%:26.4 CDE:44.15   CATARACT EXTRACTION W/PHACO Right 06/15/2018   Procedure: CATARACT EXTRACTION PHACO AND INTRAOCULAR LENS PLACEMENT (IOC);  Surgeon: Birder Robson, MD;  Location: [facility];  Service: Ophthalmology;  Laterality: Right;  [place] 00:38.2 CDE  4.23 Fluid Pack Lot # [id number]   COLONOSCOPIES     COLONOSCOPY WITH PROPOFOL N/A 10/10/2020   Procedure: COLONOSCOPY WITH PROPOFOL;  Surgeon: Lesly Rubenstein, MD;  Location: [facility];  Service: Endoscopy;  Laterality: N/A;   COLONOSCOPY WITH PROPOFOL N/A 11/20/2020   Procedure: COLONOSCOPY WITH PROPOFOL;  Surgeon: Lesly Rubenstein, MD;  Location: [facility] ENDOSCOPY;  Service: Endoscopy;  Laterality: N/A;  DM STAT CBC, BMP COVID POSITIVE 09/02/2020   CTR     ESOPHAGOGASTRODUODENOSCOPY (EGD) WITH PROPOFOL N/A 10/10/2020   Procedure: ESOPHAGOGASTRODUODENOSCOPY (EGD) WITH PROPOFOL;  Surgeon: Lesly Rubenstein, MD;  Location: [facility] ENDOSCOPY;  Service: Endoscopy;  Laterality: N/A;  COVID POSITIVE 10/08/2020   FLEXIBLE SIGMOIDOSCOPY N/A 02/06/2022   Procedure: FLEXIBLE SIGMOIDOSCOPY;  Surgeon: Lesly Rubenstein, MD;  Location: [facility] ENDOSCOPY;  Service: Endoscopy;  Laterality: N/A;  Patient requests anesthesia   IR ANGIOGRAM SELECTIVE EACH ADDITIONAL VESSEL  02/07/2022   IR ANGIOGRAM SELECTIVE EACH ADDITIONAL VESSEL  02/07/2022   IR ANGIOGRAM VISCERAL SELECTIVE  02/07/2022   IR EMBO ARTERIAL NOT HEMORR HEMANG INC GUIDE ROADMAPPING  02/07/2022   IR PARACENTESIS  12/17/2021   IR PARACENTESIS  03/25/2022   IR RADIOLOGIST EVAL & MGMT  03/18/2022   IR US GUIDE VASC ACCESS RIGHT  02/07/2022   JOINT REPLACEMENT  KNEE SURGERY Right 09/24/2017   plates and pins   TOTAL SHOULDER ARTHROPLASTY Right 09/27/2015   Patient Active Problem List   Diagnosis Date Noted   Acute blood loss anemia 03/20/2022   Acute on chronic blood loss anemia 03/05/2022   Thrombocytopenia ([place]) 03/05/2022   Fever 02/08/2022   Pancytopenia ([place]) 02/07/2022   GI bleeding 02/04/2022   Chronic kidney disease, stage 3b ([place]) 02/04/2022   Depression with anxiety 02/04/2022   Liver cirrhosis secondary to NASH ([place])    Acute GI hemorrhage 01/28/2022   AKI (acute kidney injury) ([place]) 01/27/2022   Insomnia 01/27/2022   Iron  deficiency anemia due to chronic blood loss 02/13/2021   History of uterine cancer 02/13/2021   Decompensated hepatic cirrhosis ([place]) 10/25/2020   OSA on CPAP 10/25/2020   Glossitis 10/25/2020   Hyponatremia 10/08/2020   Acute respiratory failure with hypoxia ([place]) 09/10/2020   Pneumonia due to COVID-19 virus 09/08/2020   Hip fracture, right ([place]) 09/04/2020   Closed right hip fracture ([place]) 09/02/2020   Type 2 diabetes mellitus with hyperlipidemia ([place]) 09/02/2020   Thalassemia minor    HLD (hyperlipidemia)    Acute kidney injury superimposed on CKD ([place])    Depression    Fall at home, initial encounter    Nondisplaced fracture of greater trochanter of right femur, initial encounter for closed fracture ([place])     PCP: Frazier Richards MD  REFERRING PROVIDER: Tinnie Gens, NP   REFERRING DIAG: PT eval/tx for strength and conditioning   THERAPY DIAG:  Muscle weakness (generalized)  Other abnormalities of gait and mobility  Rationale for Evaluation and Treatment Rehabilitation  ONSET DATE: Chronic  SUBJECTIVE:   SUBJECTIVE STATEMENT: Patient presents to therapy with complaint of general weakness. She was being seen in this clinic about 3 months ago but was DC per medical issues and several hospitalizations that kept her from attending therapy. Patient states she has "lost ground" and feels a lot weaker than when last seen. She is walking with RW and is having trouble with transfers form bed and out of chair.   PERTINENT HISTORY: Falls, weakness,DM, R shoulder THA Feb 2019 R knee pain (OA)   PAIN:  Are you having pain? Yes: NPRS scale: 4/10 Pain location: RT hip/ shoulder Pain description: sore, aching  Aggravating factors: use, movement, WB Relieving factors: rest, meds, non WB  PRECAUTIONS: Fall  WEIGHT BEARING RESTRICTIONS No  FALLS:  Has patient fallen in last 6 months? Yes. Number of falls 1  LIVING ENVIRONMENT: Lives with: lives with their  family and lives with their spouse Lives in: House/apartment Stairs: No Has following equipment at home: Single point cane and Environmental consultant - 2 wheeled  OCCUPATION: Retired/ Disability   PLOF: Needs assistance with ADLs  PATIENT GOALS  Get to point of not having to use cane/ improve balance    OBJECTIVE:   DIAGNOSTIC FINDINGS: NA   COGNITION:  Overall cognitive status: Within functional limits for tasks assessed     SENSATION: WFL   LOWER EXTREMITY MMT:  MMT Right eval Left eval  Hip flexion 4 4  Hip extension    Hip abduction    Hip adduction    Hip internal rotation    Hip external rotation    Knee flexion 4 4  Knee extension 4 4  Ankle dorsiflexion 4 4  Ankle plantarflexion    Ankle inversion    Ankle eversion     (Blank rows = not tested)  FUNCTIONAL TESTS:  5 times sit to stand: 28.5 second with use of hands  2 minute walk test: 170 feet  GAIT: Distance walked: 170 feet Assistive device utilized: Walker - 2 wheeled Level of assistance: Modified independence and SBA Comments: flexed trunk, decreased stride  TODAY'S TREATMENT: Eval  Functional testing HEP development    PATIENT EDUCATION:  Education details: on eval findings, POC and HEP  Person educated: Patient Education method: Explanation Education comprehension: verbalized understanding   HOME EXERCISE PROGRAM: Access Code: [email or web address] URL: [email or web address] Date: 05/01/2022 Prepared by: Josue Hector  Exercises - Seated Heel Toe Raises  - 2 x daily - 7 x weekly - 1 sets - 10 reps - Seated Long Arc Quad  - 2 x daily - 7 x weekly - 1 sets - 10 reps - Seated March  - 2 x daily - 7 x weekly - 1 sets - 10 reps - Supine Active Straight Leg Raise  - 7 x weekly - 1 sets - 10 reps  ASSESSMENT:  CLINICAL IMPRESSION: Patient is a 66 y.o. female who presents to physical therapy with complaint of weakness. Patient demonstrates muscle weakness, reduced ROM, and fascial  restrictions which are likely contributing to symptoms of pain and are negatively impacting patient ability to perform ADLs and functional mobility tasks. Patient will benefit from skilled physical therapy services to address these deficits to reduce pain and improve level of function with ADLs and functional mobility tasks.    OBJECTIVE IMPAIRMENTS Abnormal gait, decreased activity tolerance, decreased balance, decreased mobility, difficulty walking, decreased ROM, decreased strength, improper body mechanics, and pain.   ACTIVITY LIMITATIONS lifting, bending, standing, squatting, stairs, transfers, and locomotion level  PARTICIPATION LIMITATIONS: meal prep, cleaning, laundry, shopping, community activity, and yard work  PERSONAL FACTORS Past/current experiences and Time since onset of injury/illness/exacerbation are also affecting patient's functional outcome.   REHAB POTENTIAL: Good  CLINICAL DECISION MAKING: Stable/uncomplicated  EVALUATION COMPLEXITY: Low   GOALS: SHORT TERM GOALS: Target date: 05/29/2022  Patient will be independent with initial HEP and self-management strategies to improve functional outcomes Baseline:  Goal status: INITIAL  2. Patient will be able to stand, with minimal support, at least 5 minutes for improved ability to perform meal preparation/ cooking/ grooming/ cleaning ADLs.  Baseline: 2.5 minutes Goal status: INITIAL  LONG TERM GOALS: Target date: 06/26/2022  Patient will be independent with advanced HEP and self-management strategies to improve functional outcomes Baseline:  Goal status: INITIAL  2.  Patient will report at least 65% overall improvement in subjective complaint to indicate improvement in ability to perform ADLs. Baseline:  Goal status: INITIAL  3.  Patient will be able to stand, with minimal support, at least 10 minutes for improved ability to perform meal preparation/ cooking/ grooming/ cleaning ADLs.  Baseline: 2.5 minutes Goal  status: INITIAL  4. Patient will have equal to or > 4+/5 MMT throughout BLE to improve ability to perform functional mobility, stair ambulation and ADLs.  Baseline: See MMT Goal status: INITIAL  5. Patient will be able to ambulate at least 300 feet during 2MWT with LRAD to demonstrate improved ability to perform functional mobility and associated tasks. Baseline: 170 feet Goal status: INITIAL  PLAN: PT FREQUENCY: 2x/week  PT DURATION: 8 weeks  PLANNED INTERVENTIONS: Therapeutic exercises, Therapeutic activity, Neuromuscular re-education, Balance training, Gait training, Patient/Family education, Joint manipulation, Joint mobilization, Stair training, Aquatic Therapy, Dry Needling, Electrical stimulation, Spinal manipulation, Spinal mobilization, Cryotherapy, Moist heat, scar mobilization, Taping, Traction, Ultrasound, Biofeedback, Ionotophoresis 48m/ml Dexamethasone, and Manual therapy.  PLAN FOR NEXT SESSION: Progress functional LE strength, gait and balance.   1:12 PM, 05/01/22 Josue Hector PT DPT  Physical Therapist with [facility]  [phone]

## 2022-05-01 NOTE — Patient Instructions (Signed)
[name]  Discharge Instructions: Thank you for choosing [facility] to provide your oncology and hematology care.  If you have a lab appointment with the [facility], please come in thru the Main Entrance and check in at the main information desk.  Wear comfortable clothing and clothing appropriate for easy access to any Portacath or PICC line.   We strive to give you quality time with your provider. You may need to reschedule your appointment if you arrive late (15 or more minutes).  Arriving late affects you and other patients whose appointments are after yours.  Also, if you miss three or more appointments without notifying the office, you may be dismissed from the clinic at the provider's discretion.      For prescription refill requests, have your pharmacy contact our office and allow 72 hours for refills to be completed.    Today you received 2 unit of blood.     BELOW ARE SYMPTOMS THAT SHOULD BE REPORTED IMMEDIATELY: *FEVER GREATER THAN 100.4 F (38 C) OR HIGHER *CHILLS OR SWEATING *NAUSEA AND VOMITING THAT IS NOT CONTROLLED WITH YOUR NAUSEA MEDICATION *UNUSUAL SHORTNESS OF BREATH *UNUSUAL BRUISING OR BLEEDING *URINARY PROBLEMS (pain or burning when urinating, or frequent urination) *BOWEL PROBLEMS (unusual diarrhea, constipation, pain near the anus) TENDERNESS IN MOUTH AND THROAT WITH OR WITHOUT PRESENCE OF ULCERS (sore throat, sores in mouth, or a toothache) UNUSUAL RASH, SWELLING OR PAIN  UNUSUAL VAGINAL DISCHARGE OR ITCHING   Items with * indicate a potential emergency and should be followed up as soon as possible or go to the Emergency Department if any problems should occur.  Please show the CHEMOTHERAPY ALERT CARD or IMMUNOTHERAPY ALERT CARD at check-in to the Emergency Department and triage nurse.  Should you have questions after your visit or need to cancel or reschedule your appointment, please contact [name] [phone]  and follow the prompts.  Office hours are 8:00 a.m. to 4:30 p.m. Monday - Friday. Please note that voicemails left after 4:00 p.m. may not be returned until the following business day.  We are closed weekends and major holidays. You have access to a nurse at all times for urgent questions. Please call the main number to the clinic [phone] and follow the prompts.  For any non-urgent questions, you may also contact your provider using MyChart. We now offer e-Visits for anyone 63 and older to request care online for non-urgent symptoms. For details visit [email or web address].   Also download the MyChart app! Go to the app store, search "MyChart", open the app, select [facility], and log in with your MyChart username and password.  Masks are optional in the cancer centers. If you would like for your care team to wear a mask while they are taking care of you, please let them know. You may have one support person who is at least 64 years old accompany you for your appointments.

## 2022-05-02 ENCOUNTER — Other Ambulatory Visit: Payer: Self-pay | Admitting: Gastroenterology

## 2022-05-02 DIAGNOSIS — K7460 Unspecified cirrhosis of liver: Secondary | ICD-10-CM

## 2022-05-02 LAB — BPAM RBC
Blood Product Expiration Date: 202309262359
Blood Product Expiration Date: 202309262359
ISSUE DATE / TIME: 202309140850
ISSUE DATE / TIME: 202309141041
Unit Type and Rh: 6200
Unit Type and Rh: 6200

## 2022-05-02 LAB — TYPE AND SCREEN
ABO/RH(D): A POS
Antibody Screen: NEGATIVE
Unit division: 0
Unit division: 0

## 2022-05-06 ENCOUNTER — Ambulatory Visit
Admission: RE | Admit: 2022-05-06 | Discharge: 2022-05-06 | Disposition: A | Discharge status: Home / Self Care | Payer: 59 | Source: Ambulatory Visit | Attending: Gastroenterology | Admitting: Gastroenterology

## 2022-05-06 DIAGNOSIS — K7581 Nonalcoholic steatohepatitis (NASH): Secondary | ICD-10-CM | POA: Insufficient documentation

## 2022-05-06 DIAGNOSIS — K7460 Unspecified cirrhosis of liver: Secondary | ICD-10-CM | POA: Diagnosis present

## 2022-05-06 DIAGNOSIS — K7469 Other cirrhosis of liver: Secondary | ICD-10-CM | POA: Diagnosis not present

## 2022-05-06 DIAGNOSIS — R188 Other ascites: Secondary | ICD-10-CM | POA: Diagnosis not present

## 2022-05-06 MED ORDER — ALBUMIN HUMAN 25 % IV SOLN
INTRAVENOUS | Status: AC
  Administered 2022-05-06: 25 g via INTRAVENOUS
  Filled 2022-05-06: qty 100

## 2022-05-06 MED ORDER — ALBUMIN HUMAN 25 % IV SOLN: 25.0000 g | Freq: Once | INTRAVENOUS | Status: AC

## 2022-05-06 NOTE — Procedures (Signed)
PROCEDURE SUMMARY:  Successful US guided diagnostic paracentesis from [facility].  Yielded 7.8 L of clear, yellow fluid.  No immediate complications.  Pt tolerated well.   Specimen not sent for labs.  EBL < 1 mL  Tyson Alias, AGNP 05/06/2022 1:12 PM

## 2022-05-07 ENCOUNTER — Inpatient Hospital Stay: Payer: 59

## 2022-05-07 ENCOUNTER — Ambulatory Visit
Admission: RE | Admit: 2022-05-07 | Discharge: 2022-05-07 | Disposition: A | Discharge status: Home / Self Care | Payer: 59 | Source: Ambulatory Visit | Attending: Interventional Radiology | Admitting: Interventional Radiology

## 2022-05-07 ENCOUNTER — Other Ambulatory Visit: Payer: Self-pay | Admitting: Interventional Radiology

## 2022-05-07 DIAGNOSIS — K7460 Unspecified cirrhosis of liver: Secondary | ICD-10-CM

## 2022-05-07 DIAGNOSIS — Z9889 Other specified postprocedural states: Secondary | ICD-10-CM | POA: Diagnosis not present

## 2022-05-07 DIAGNOSIS — K643 Fourth degree hemorrhoids: Secondary | ICD-10-CM | POA: Diagnosis not present

## 2022-05-07 DIAGNOSIS — R600 Localized edema: Secondary | ICD-10-CM

## 2022-05-07 DIAGNOSIS — K7581 Nonalcoholic steatohepatitis (NASH): Secondary | ICD-10-CM | POA: Diagnosis not present

## 2022-05-07 DIAGNOSIS — M6281 Muscle weakness (generalized): Secondary | ICD-10-CM | POA: Diagnosis not present

## 2022-05-07 DIAGNOSIS — D500 Iron deficiency anemia secondary to blood loss (chronic): Secondary | ICD-10-CM

## 2022-05-07 HISTORY — PX: IR RADIOLOGIST EVAL & MGMT: IMG5224

## 2022-05-07 LAB — PREPARE RBC (CROSSMATCH)

## 2022-05-07 LAB — CBC
HCT: 21 % — ABNORMAL LOW (ref 36.0–46.0)
Hemoglobin: 6.6 g/dL — CL (ref 12.0–15.0)
MCH: 24.1 pg — ABNORMAL LOW (ref 26.0–34.0)
MCHC: 31.4 g/dL (ref 30.0–36.0)
MCV: 76.6 fL — ABNORMAL LOW (ref 80.0–100.0)
Platelets: 134 10*3/uL — ABNORMAL LOW (ref 150–400)
RBC: 2.74 MIL/uL — ABNORMAL LOW (ref 3.87–5.11)
RDW: 20.7 % — ABNORMAL HIGH (ref 11.5–15.5)
WBC: 2.9 10*3/uL — ABNORMAL LOW (ref 4.0–10.5)
nRBC: 0 % (ref 0.0–0.2)

## 2022-05-07 NOTE — Progress Notes (Signed)
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  She is considered a poor surgical candidate.  Therefore, she is status post superior rectal artery embolization on 02/07/22 at [facility].  The procedure went as planned without complication.  She was discharged home on 02/10/22 after no further bleeding was noted and her anemia was stable.  Unfortunately, some bleeding continued after discharge and was related to increased ascites, therefore was re-admitted requiring additional blood transfusion from 03/05/22 to 03/07/22.  Additional admission with transfusions was required from 03/20/22-03/28/22.  Since discharge, she has had scant, intermittent bleeding.  She is being followed weekly for CBC and transfusions as needed, the last yesterday.    She continues to endorse significant leg swelling and has recurrent ascites (s/p TIPS revision 03/12/22).  The swelling limits her mobility and requires the use of a walker.  Follow up at [facility] with IR scheduled for November.     Past Medical History:  Diagnosis Date   Anxiety    Arthritis    Cirrhosis of liver ([place])    Diabetes mellitus without complication (HCC)    Dyspnea    DOE   GERD (gastroesophageal reflux disease)    Hepatitis    PAST   Hypertension    Neuropathy    Neuropathy, diabetic ([place])    Pneumonia    Sinus complaint    Sleep apnea    CPAP   Thalassemia minor 1992    Past Surgical History:  Procedure Laterality Date   ABDOMINAL HYSTERECTOMY     BREAST BIOPSY Right 02/15/2018   [place] bx 6-6:30 ribbon shape, ONE CORE FRAGMENT WITH FIBROSIS. ONE CORE FRAGMENT WITH PORTION OF A DILATED   BREAST BIOPSY Right 02/15/2018   [place] bx 9:00 heart shape,  USUAL DUCTAL HYPERPLASIA   BREAST LUMPECTOMY Right 03/09/2018   Procedure: BREAST LUMPECTOMY x 2;  Surgeon: Benjamine Sprague, DO;  Location: [facility] ORS;  Service: General;  Laterality: Right;   CATARACT EXTRACTION W/PHACO Left 06/11/2016   Procedure: CATARACT EXTRACTION PHACO AND INTRAOCULAR LENS PLACEMENT (IOC);  Surgeon: Estill Cotta, MD;  Location: [facility] ORS;  Service: Ophthalmology;  Laterality: Left;  Lot # [id number] H US:01:38.6 AP%:26.4 CDE:44.15   CATARACT EXTRACTION W/PHACO Right 06/15/2018   Procedure: CATARACT EXTRACTION PHACO AND INTRAOCULAR LENS PLACEMENT (IOC);  Surgeon: Birder Robson, MD;  Location: [facility] ORS;  Service: Ophthalmology;  Laterality: Right;  [place] 00:38.2 CDE 4.23 Fluid Pack Lot # [id number]   COLONOSCOPIES     COLONOSCOPY WITH PROPOFOL N/A 10/10/2020   Procedure: COLONOSCOPY WITH PROPOFOL;  Surgeon: Lesly Rubenstein, MD;  Location: [facility] ENDOSCOPY;  Service: Endoscopy;  Laterality: N/A;   COLONOSCOPY WITH PROPOFOL N/A 11/20/2020   Procedure: COLONOSCOPY WITH PROPOFOL;  Surgeon: Lesly Rubenstein, MD;  Location: [facility] ENDOSCOPY;  Service: Endoscopy;  Laterality: N/A;  DM STAT CBC, BMP COVID POSITIVE 09/02/2020   CTR     ESOPHAGOGASTRODUODENOSCOPY (EGD) WITH PROPOFOL N/A 10/10/2020   Procedure: ESOPHAGOGASTRODUODENOSCOPY (EGD) WITH PROPOFOL;  Surgeon: Lesly Rubenstein, MD;  Location: [facility] ENDOSCOPY;  Service: Endoscopy;  Laterality: N/A;  COVID POSITIVE 10/08/2020   FLEXIBLE SIGMOIDOSCOPY N/A 02/06/2022   Procedure: FLEXIBLE SIGMOIDOSCOPY;  Surgeon: Lesly Rubenstein, MD;  Location: [facility] ENDOSCOPY;  Service: Endoscopy;  Laterality: N/A;  Patient requests anesthesia   IR ANGIOGRAM SELECTIVE EACH ADDITIONAL VESSEL  02/07/2022   IR ANGIOGRAM SELECTIVE EACH ADDITIONAL VESSEL  02/07/2022   IR ANGIOGRAM VISCERAL SELECTIVE  02/07/2022   IR EMBO ARTERIAL NOT HEMORR HEMANG INC GUIDE ROADMAPPING  02/07/2022   IR PARACENTESIS  12/17/2021   IR PARACENTESIS  03/25/2022   IR RADIOLOGIST  EVAL & MGMT  03/18/2022   IR US GUIDE VASC ACCESS RIGHT  02/07/2022   JOINT REPLACEMENT     KNEE SURGERY Right 09/24/2017   plates and pins   TOTAL SHOULDER ARTHROPLASTY Right 09/27/2015    Allergies: Gramineae pollens and Latex  Medications: Prior to Admission medications   Medication Sig Start Date End Date Taking? Authorizing Provider  acetaminophen (TYLENOL) 650 MG CR tablet Take 1,300 mg by mouth daily as needed for pain.    [provider]  albuterol (VENTOLIN HFA) 108 (90 Base) MCG/ACT inhaler Inhale 2 puffs into the lungs every 6 (six) hours as needed for wheezing or shortness of breath. 02/10/22   Loletha Grayer, MD  atorvastatin (LIPITOR) 10 MG tablet Take 10 mg by mouth daily. 01/28/21   [provider]  diphenhydrAMINE (BENADRYL) 25 MG tablet Take 25 mg by mouth 2 (two) times daily as needed for itching.    [provider]  ergocalciferol (VITAMIN D2) 1.25 MG (50000 UT) capsule Take 1 capsule (50,000 Units total) by mouth once a week. 04/02/22   Harriett Rush, PA-C  gabapentin (NEURONTIN) 400 MG capsule Take 800 mg by mouth 2 (two) times daily. 01/07/18   [provider]  hydrocortisone (ANUSOL-HC) 2.5 % rectal cream Place rectally 4 (four) times daily. 03/07/22   Loletha Grayer, MD  lactulose, encephalopathy, (CHRONULAC) 10 GM/15ML SOLN Take 30 mLs (20 g total) by mouth daily. Patient not taking: Reported on 04/02/2022 01/30/22   Roxan Hockey, MD  melatonin 5 MG TABS Take 10 mg by mouth at bedtime as needed (sleep).    [provider]  Multiple Vitamins-Minerals (MULTIVITAMIN WITH MINERALS) tablet Take 1 tablet by mouth daily.    [provider]  sertraline (ZOLOFT) 50 MG tablet Take 50 mg by mouth daily. 12/12/17   [provider]  sodium chloride (OCEAN) 0.65 % SOLN nasal spray Place 1 spray into both nostrils as needed for congestion. Patient taking differently: Place 1 spray into both nostrils at bedtime.  02/10/22   Loletha Grayer, MD  vitamin B-12 (CYANOCOBALAMIN) 1000 MCG tablet Take 1,000 mcg by mouth daily.    [provider]  witch hazel-glycerin (TUCKS) pad Apply topically as needed for hemorrhoids. 03/07/22   Loletha Grayer, MD     Family History  Problem Relation Age of Onset   Breast cancer Mother 66   Lymphoma Mother    Diabetes Father    Kidney cancer Father    Heart disease Father    Diabetes Sister    Breast cancer Sister 52    Social History   Socioeconomic History   Marital status: Married    Spouse name: Not on file   Number of children: 0   Years of education: Not on file   Highest education level: Not on file  Occupational History   Occupation: EMPLOYED  Tobacco Use   Smoking status: Never   Smokeless tobacco: Never  Vaping Use   Vaping Use: Never used  Substance and Sexual Activity   Alcohol use: No   Drug use: Never   Sexual activity: Not Currently  Other Topics Concern  Not on file  Social History Narrative   Not on file   Social Determinants of Health   Financial Resource Strain: Low Risk  (04/04/2022)   Overall Financial Resource Strain (CARDIA)    Difficulty of Paying Living Expenses: Not hard at all  Food Insecurity: Unknown (04/04/2022)   Hunger Vital Sign    Worried About Running Out of Food in the Last Year: Not on file    Ran Out of Food in the Last Year: Never true  Transportation Needs: No Transportation Needs (04/04/2022)   PRAPARE - Hydrologist (Medical): No    Lack of Transportation (Non-Medical): No  Physical Activity: Inactive (04/04/2022)   Exercise Vital Sign    Days of Exercise per Week: 0 days    Minutes of Exercise per Session: 0 min  Stress: No Stress Concern Present (04/04/2022)   [place]    Feeling of Stress : Only a little  Social Connections: Moderately Isolated (04/04/2022)   Social Connection and Isolation  Panel [NHANES]    Frequency of Communication with Friends and Family: More than three times a week    Frequency of Social Gatherings with Friends and Family: Three times a week    Attends Religious Services: Never    Active Member of Clubs or Organizations: No    Attends Archivist Meetings: Never    Marital Status: Married     Vital Signs: There were no vitals taken for this visit.  No physical examination was performed in lieu of virtual telephone clinic visit.   Imaging: No new pertinent imaging.  Labs:  CBC: Recent Labs    04/09/22 1337 04/16/22 1330 04/23/22 1338 04/30/22 1329  WBC 3.7* 4.4 3.7* 3.8*  HGB 7.8* 7.6* 7.9* 5.6*  HCT 24.9* 24.5* 26.0* 18.4*  PLT 176 156 136* 130*    COAGS: Recent Labs    01/28/22 1707 02/04/22 1245 03/05/22 1650 03/22/22 0206  INR 1.2 1.3* 1.2 1.3*  APTT  --  31  --  30    BMP: Recent Labs    03/25/22 0354 03/26/22 0313 03/27/22 0401 04/02/22 1035  NA 139 138 138 137  K 3.4* 3.5 3.8 4.2  CL 111 111 112* 110  CO2 22 23 22 23   GLUCOSE 144* 160* 143* 134*  BUN 25* 25* 28* 38*  CALCIUM 8.3* 8.0* 8.1* 8.1*  CREATININE 1.29* 1.19* 1.20* 1.49*  GFRNONAA 46* 51* 51* 39*    LIVER FUNCTION TESTS: Recent Labs    03/22/22 0206 03/25/22 0354 03/26/22 0313 04/02/22 1035  BILITOT 3.0* 1.9* 1.7* 1.3*  AST 48* 37 37 46*  ALT 24 19 20 27   ALKPHOS 82 70 75 136*  PROT 3.9* 3.9* 4.0* 5.1*  ALBUMIN 2.1* 1.9* 1.9* 2.4*    Assessment and Plan: 66 year old female with history of NASH cirrhosis and grade 4 hemorrhagic internal hemorrhoids status post bilateral superior rectal artery embolization on 02/10/22.  This was initially successful in significantly decreasing her bleeding, however bleeding has returned, though not as significant as prior to procedure.  She is status post TIPS creation in May 2023 at [facility], which has not alleviated her ascites production, therefore underwent TIPS revision (expansion to 10 mm) on  03/12/22.  Etiology of continued GI bleed and anemia is likely multifactorial from hematologic/coagulopathic, portal hypertension, anatomic standpoints.  I am skeptical that bleeding is from hemorrhoids after embolization, however certainly possible.  Contingency plan for  any future admissions for severe anemia in the setting of persistent GI bleed:  nuclear medicine tagged RBC scan to assess for location of GI bleeding - if from rectum, would then proceed to angiogram with possible repeat embolization  -in the interim, plan for TIPS duplex to assess patency, as her volume of ascites and debilitating lower extremity edema should be improved by now after TIPS + revision.  This can be scheduled at [facility] at the time of her next paracentesis.   -plan for phone call follow up once [place] is complete   Electronically Signed: Rosanne Ashing Gueydan 05/07/2022, 11:00 AM   I spent a total of 25 Minutes in virtual telephone clinical consultation, greater than 50% of which was counseling/coordinating care for hemorrhagic internal hemorrhoids.

## 2022-05-07 NOTE — Progress Notes (Unsigned)
CRITICAL VALUE ALERT Critical value received:  hgb 6.6 Date of notification:  05-07-22 Time of notification: 9702 Critical value read back:  Yes.   Nurse who received alert:  B. Presnell RN MD notified time and response:  Reb. Pennington PA-C. [id number]. Will give 2 units tomorrow.

## 2022-05-08 ENCOUNTER — Other Ambulatory Visit: Payer: Self-pay | Admitting: Interventional Radiology

## 2022-05-08 ENCOUNTER — Inpatient Hospital Stay: Payer: 59

## 2022-05-08 DIAGNOSIS — R600 Localized edema: Secondary | ICD-10-CM

## 2022-05-08 DIAGNOSIS — M6281 Muscle weakness (generalized): Secondary | ICD-10-CM | POA: Diagnosis not present

## 2022-05-08 DIAGNOSIS — K7460 Unspecified cirrhosis of liver: Secondary | ICD-10-CM

## 2022-05-08 DIAGNOSIS — D500 Iron deficiency anemia secondary to blood loss (chronic): Secondary | ICD-10-CM

## 2022-05-08 MED ORDER — SODIUM CHLORIDE 0.9% IV SOLUTION
250.0000 mL | Freq: Once | INTRAVENOUS | Status: AC
  Administered 2022-05-08: 250 mL via INTRAVENOUS

## 2022-05-08 NOTE — Patient Instructions (Signed)
[facility]  Discharge Instructions: Thank you for choosing [facility] to provide your oncology and hematology care.  If you have a lab appointment with the [facility], please come in thru the Main Entrance and check in at the main information desk.  Wear comfortable clothing and clothing appropriate for easy access to any Portacath or PICC line.   We strive to give you quality time with your provider. You may need to reschedule your appointment if you arrive late (15 or more minutes).  Arriving late affects you and other patients whose appointments are after yours.  Also, if you miss three or more appointments without notifying the office, you may be dismissed from the clinic at the provider's discretion.      For prescription refill requests, have your pharmacy contact our office and allow 72 hours for refills to be completed.    Today you received the following 2 units of blood, return as scheduled.   To help prevent nausea and vomiting after your treatment, we encourage you to take your nausea medication as directed.  BELOW ARE SYMPTOMS THAT SHOULD BE REPORTED IMMEDIATELY: *FEVER GREATER THAN 100.4 F (38 C) OR HIGHER *CHILLS OR SWEATING *NAUSEA AND VOMITING THAT IS NOT CONTROLLED WITH YOUR NAUSEA MEDICATION *UNUSUAL SHORTNESS OF BREATH *UNUSUAL BRUISING OR BLEEDING *URINARY PROBLEMS (pain or burning when urinating, or frequent urination) *BOWEL PROBLEMS (unusual diarrhea, constipation, pain near the anus) TENDERNESS IN MOUTH AND THROAT WITH OR WITHOUT PRESENCE OF ULCERS (sore throat, sores in mouth, or a toothache) UNUSUAL RASH, SWELLING OR PAIN  UNUSUAL VAGINAL DISCHARGE OR ITCHING   Items with * indicate a potential emergency and should be followed up as soon as possible or go to the Emergency Department if any problems should occur.  Please show the CHEMOTHERAPY ALERT CARD or IMMUNOTHERAPY ALERT CARD at check-in to the Emergency Department  and triage nurse.  Should you have questions after your visit or need to cancel or reschedule your appointment, please contact [facility] [phone]  and follow the prompts.  Office hours are 8:00 a.m. to 4:30 p.m. Monday - Friday. Please note that voicemails left after 4:00 p.m. may not be returned until the following business day.  We are closed weekends and major holidays. You have access to a nurse at all times for urgent questions. Please call the main number to the clinic [phone] and follow the prompts.  For any non-urgent questions, you may also contact your provider using MyChart. We now offer e-Visits for anyone 64 and older to request care online for non-urgent symptoms. For details visit [email or web address].   Also download the MyChart app! Go to the app store, search "MyChart", open the app, select [facility], and log in with your MyChart username and password.  Masks are optional in the cancer centers. If you would like for your care team to wear a mask while they are taking care of you, please let them know. You may have one support person who is at least 65 years old accompany you for your appointments.

## 2022-05-08 NOTE — Progress Notes (Signed)
Patient presents today for 2 units of PRBCs, patient reports taking tylenol and benadryl this morning before coming this morning. Patient tolerated blood transfusion with no complaints voiced. Peripheral IV site clean and dry with good blood return noted before and after infusion. Band aid applied. VSS with discharge and left in satisfactory condition with no s/s of distress noted.

## 2022-05-09 LAB — TYPE AND SCREEN
ABO/RH(D): A POS
Antibody Screen: NEGATIVE
Unit division: 0
Unit division: 0

## 2022-05-09 LAB — BPAM RBC
Blood Product Expiration Date: 202309292359
Blood Product Expiration Date: 202310182359
ISSUE DATE / TIME: 202309211039
ISSUE DATE / TIME: 202309211257
Unit Type and Rh: 6200
Unit Type and Rh: 6200

## 2022-05-13 ENCOUNTER — Ambulatory Visit
Admission: RE | Admit: 2022-05-13 | Discharge: 2022-05-13 | Disposition: A | Discharge status: Home / Self Care | Payer: 59 | Source: Ambulatory Visit | Attending: Gastroenterology | Admitting: Gastroenterology

## 2022-05-13 ENCOUNTER — Other Ambulatory Visit: Payer: Self-pay | Admitting: Interventional Radiology

## 2022-05-13 ENCOUNTER — Ambulatory Visit
Admission: RE | Admit: 2022-05-13 | Discharge: 2022-05-13 | Disposition: A | Discharge status: Home / Self Care | Payer: 59 | Source: Ambulatory Visit | Attending: Interventional Radiology | Admitting: Interventional Radiology

## 2022-05-13 DIAGNOSIS — R600 Localized edema: Secondary | ICD-10-CM | POA: Insufficient documentation

## 2022-05-13 DIAGNOSIS — K7460 Unspecified cirrhosis of liver: Secondary | ICD-10-CM | POA: Diagnosis present

## 2022-05-13 DIAGNOSIS — R188 Other ascites: Secondary | ICD-10-CM | POA: Diagnosis not present

## 2022-05-13 DIAGNOSIS — K7581 Nonalcoholic steatohepatitis (NASH): Secondary | ICD-10-CM | POA: Diagnosis present

## 2022-05-13 DIAGNOSIS — R161 Splenomegaly, not elsewhere classified: Secondary | ICD-10-CM | POA: Diagnosis not present

## 2022-05-13 MED ORDER — ALBUMIN HUMAN 25 % IV SOLN
25.0000 g | Freq: Once | INTRAVENOUS | Status: AC
  Administered 2022-05-13: 25 g via INTRAVENOUS

## 2022-05-13 MED ORDER — ALBUMIN HUMAN 25 % IV SOLN
INTRAVENOUS | Status: AC
  Filled 2022-05-13: qty 100

## 2022-05-13 MED ORDER — ALBUMIN HUMAN 25 % IV SOLN: 25.0000 g | Freq: Once | INTRAVENOUS | Status: AC

## 2022-05-13 MED ORDER — ALBUMIN HUMAN 25 % IV SOLN
INTRAVENOUS | Status: AC
  Administered 2022-05-13: 25 g via INTRAVENOUS
  Filled 2022-05-13: qty 100

## 2022-05-13 NOTE — Procedures (Signed)
PROCEDURE SUMMARY:  Successful US guided therapeutic paracentesis from RLQ.  Yielded 8 L of clear, yellow fluid.  No immediate complications.  Pt tolerated well.   Specimen not sent for labs.  EBL < 1 mL  Tyson Alias, AGNP 05/13/2022 11:54 AM

## 2022-05-14 ENCOUNTER — Ambulatory Visit (HOSPITAL_COMMUNITY): Payer: 59 | Admitting: Physical Therapy

## 2022-05-14 ENCOUNTER — Inpatient Hospital Stay: Payer: 59

## 2022-05-14 DIAGNOSIS — D500 Iron deficiency anemia secondary to blood loss (chronic): Secondary | ICD-10-CM

## 2022-05-14 DIAGNOSIS — R262 Difficulty in walking, not elsewhere classified: Secondary | ICD-10-CM

## 2022-05-14 DIAGNOSIS — M6281 Muscle weakness (generalized): Secondary | ICD-10-CM

## 2022-05-14 DIAGNOSIS — R2689 Other abnormalities of gait and mobility: Secondary | ICD-10-CM

## 2022-05-14 DIAGNOSIS — K7460 Unspecified cirrhosis of liver: Secondary | ICD-10-CM

## 2022-05-14 LAB — CBC
HCT: 22.1 % — ABNORMAL LOW (ref 36.0–46.0)
Hemoglobin: 6.8 g/dL — CL (ref 12.0–15.0)
MCH: 24.2 pg — ABNORMAL LOW (ref 26.0–34.0)
MCHC: 30.8 g/dL (ref 30.0–36.0)
MCV: 78.6 fL — ABNORMAL LOW (ref 80.0–100.0)
Platelets: 148 10*3/uL — ABNORMAL LOW (ref 150–400)
RBC: 2.81 MIL/uL — ABNORMAL LOW (ref 3.87–5.11)
RDW: 20.4 % — ABNORMAL HIGH (ref 11.5–15.5)
WBC: 3.4 10*3/uL — ABNORMAL LOW (ref 4.0–10.5)
nRBC: 0 % (ref 0.0–0.2)

## 2022-05-14 LAB — PREPARE RBC (CROSSMATCH)

## 2022-05-14 LAB — SAMPLE TO BLOOD BANK

## 2022-05-14 NOTE — Progress Notes (Unsigned)
CRITICAL VALUE ALERT Critical value received:  hgb 6.8 Date of notification:  05-14-22 Time of notification: 1254 Critical value read back:  Yes.   Nurse who received alert:  D. Redmond Pulling RN MD notified time and response:  [id number], Tarri Abernethy PA-C. Will give 2 units of blood tomorrow as ordered.   Patient called and is aware. Patient states she has bleeding every 3-4 days. Refused to go to the emergency room.

## 2022-05-14 NOTE — Therapy (Signed)
OUTPATIENT PHYSICAL THERAPY LOWER EXTREMITY EVALUATION   Patient Name: Rachel Huber MRN: [id number] DOB:1956/05/22, 66 y.o., female Today's Date: 05/14/2022   PT End of Session - 05/14/22 1444     Visit Number 2    Number of Visits 18    Date for PT Re-Evaluation 06/26/22    Authorization Type [employer] (no VL)    Progress Note Due on Visit 10    PT Start Time 1437    PT Stop Time 1525    PT Time Calculation (min) 48 min    Activity Tolerance Patient tolerated treatment well    Behavior During Therapy WFL for tasks assessed/performed             Past Medical History:  Diagnosis Date   Anxiety    Arthritis    Cirrhosis of liver ([place])    Diabetes mellitus without complication ([place])    Dyspnea    DOE   GERD (gastroesophageal reflux disease)    Hepatitis    PAST   Hypertension    Neuropathy    Neuropathy, diabetic ([place])    Pneumonia    Sinus complaint    Sleep apnea    CPAP   Thalassemia minor 1992   Past Surgical History:  Procedure Laterality Date   ABDOMINAL HYSTERECTOMY     BREAST BIOPSY Right 02/15/2018   [place] bx 6-6:30 ribbon shape, ONE CORE FRAGMENT WITH FIBROSIS. ONE CORE FRAGMENT WITH PORTION OF A DILATED   BREAST BIOPSY Right 02/15/2018   [place] bx 9:00 heart shape, USUAL DUCTAL HYPERPLASIA   BREAST LUMPECTOMY Right 03/09/2018   Procedure: BREAST LUMPECTOMY x 2;  Surgeon: Benjamine Sprague, DO;  Location: [facility] ORS;  Service: General;  Laterality: Right;   CATARACT EXTRACTION W/PHACO Left 06/11/2016   Procedure: CATARACT EXTRACTION PHACO AND INTRAOCULAR LENS PLACEMENT (IOC);  Surgeon: Estill Cotta, MD;  Location: [facility];  Service: Ophthalmology;  Laterality: Left;  Lot # [id number] H US:01:38.6 AP%:26.4 CDE:44.15   CATARACT EXTRACTION W/PHACO Right 06/15/2018   Procedure: CATARACT EXTRACTION PHACO AND INTRAOCULAR LENS PLACEMENT (IOC);  Surgeon: Birder Robson, MD;  Location: [facility];  Service: Ophthalmology;  Laterality: Right;  [place] 00:38.2 CDE  4.23 Fluid Pack Lot # [id number]   COLONOSCOPIES     COLONOSCOPY WITH PROPOFOL N/A 10/10/2020   Procedure: COLONOSCOPY WITH PROPOFOL;  Surgeon: Lesly Rubenstein, MD;  Location: [facility] ENDOSCOPY;  Service: Endoscopy;  Laterality: N/A;   COLONOSCOPY WITH PROPOFOL N/A 11/20/2020   Procedure: COLONOSCOPY WITH PROPOFOL;  Surgeon: Lesly Rubenstein, MD;  Location: [facility] ENDOSCOPY;  Service: Endoscopy;  Laterality: N/A;  DM STAT CBC, BMP COVID POSITIVE 09/02/2020   CTR     ESOPHAGOGASTRODUODENOSCOPY (EGD) WITH PROPOFOL N/A 10/10/2020   Procedure: ESOPHAGOGASTRODUODENOSCOPY (EGD) WITH PROPOFOL;  Surgeon: Lesly Rubenstein, MD;  Location: [facility] ENDOSCOPY;  Service: Endoscopy;  Laterality: N/A;  COVID POSITIVE 10/08/2020   FLEXIBLE SIGMOIDOSCOPY N/A 02/06/2022   Procedure: FLEXIBLE SIGMOIDOSCOPY;  Surgeon: Lesly Rubenstein, MD;  Location: [facility] ENDOSCOPY;  Service: Endoscopy;  Laterality: N/A;  Patient requests anesthesia   IR ANGIOGRAM SELECTIVE EACH ADDITIONAL VESSEL  02/07/2022   IR ANGIOGRAM SELECTIVE EACH ADDITIONAL VESSEL  02/07/2022   IR ANGIOGRAM VISCERAL SELECTIVE  02/07/2022   IR EMBO ARTERIAL NOT HEMORR HEMANG INC GUIDE ROADMAPPING  02/07/2022   IR PARACENTESIS  12/17/2021   IR PARACENTESIS  03/25/2022   IR RADIOLOGIST EVAL & MGMT  03/18/2022   IR RADIOLOGIST EVAL & MGMT  05/07/2022   IR US GUIDE  VASC ACCESS RIGHT  02/07/2022   JOINT REPLACEMENT     KNEE SURGERY Right 09/24/2017   plates and pins   TOTAL SHOULDER ARTHROPLASTY Right 09/27/2015   Patient Active Problem List   Diagnosis Date Noted   Acute blood loss anemia 03/20/2022   Acute on chronic blood loss anemia 03/05/2022   Thrombocytopenia ([place]) 03/05/2022   Fever 02/08/2022   Pancytopenia ([place]) 02/07/2022   GI bleeding 02/04/2022   Chronic kidney disease, stage 3b ([place]) 02/04/2022   Depression with anxiety 02/04/2022   Liver cirrhosis secondary to NASH ([place])    Acute GI hemorrhage 01/28/2022   AKI (acute kidney injury) ([place])  01/27/2022   Insomnia 01/27/2022   Iron deficiency anemia due to chronic blood loss 02/13/2021   History of uterine cancer 02/13/2021   Decompensated hepatic cirrhosis ([place]) 10/25/2020   OSA on CPAP 10/25/2020   Glossitis 10/25/2020   Hyponatremia 10/08/2020   Acute respiratory failure with hypoxia ([place]) 09/10/2020   Pneumonia due to COVID-19 virus 09/08/2020   Hip fracture, right ([place]) 09/04/2020   Closed right hip fracture ([place]) 09/02/2020   Type 2 diabetes mellitus with hyperlipidemia ([place]) 09/02/2020   Thalassemia minor    HLD (hyperlipidemia)    Acute kidney injury superimposed on CKD ([place])    Depression    Fall at home, initial encounter    Nondisplaced fracture of greater trochanter of right femur, initial encounter for closed fracture ([place])     PCP:  Richards MD  REFERRING PROVIDER: Tinnie Gens, NP   REFERRING DIAG: PT eval/tx for strength and conditioning   THERAPY DIAG:  Muscle weakness (generalized)  Other abnormalities of gait and mobility  Difficulty in walking, not elsewhere classified  Cirrhosis, non-alcoholic ([place])  Rationale for Evaluation and Treatment Rehabilitation  ONSET DATE: Chronic  SUBJECTIVE:   SUBJECTIVE STATEMENT: Pt returns today following 2 weeks since initial evaluation.  Reports she had a paracentesis yesterday where they pumped off 8 liters of fluid, however her legs are still swollen.  States she cannot take fluid pills because of her kidney function.  Currently having 7/10 in her Rt shoulder as she fell on it the beginning of Sept and it has been sore since.  Plans on returning to shoulder surgeon to get it rechecked.  Pain today in her LE's at 5/10 due to the fluid.  States she is getting a blood transfusion tomorrow as her Hgb is 6.8.   Eval:  Patient presents to therapy with complaint of general weakness. She was being seen in this clinic about 3 months ago but was DC per medical issues and several  hospitalizations that kept her from attending therapy. Patient states she has "lost ground" and feels a lot weaker than when last seen. She is walking with RW and is having trouble with transfers form bed and out of chair.   PERTINENT HISTORY: Falls, weakness,DM, R shoulder reverse replacement anFeb 2019 R knee pain ([place])   PAIN:  Are you having pain? Yes: NPRS scale: 7/10 Pain location: RT hip/ shoulder Pain description: sore, aching  Aggravating factors: use, movement, WB Relieving factors: rest, meds, non WB  PRECAUTIONS: Fall  WEIGHT BEARING RESTRICTIONS No  FALLS:  Has patient fallen in last 6 months? Yes. Number of falls 1  LIVING ENVIRONMENT: Lives with: lives with their family and lives with their spouse Lives in: House/apartment Stairs: No Has following equipment at home: Single point cane and Environmental consultant - 2 wheeled  OCCUPATION: Retired/ Disability   PLOF:  Needs assistance with ADLs  PATIENT GOALS  Get to point of not having to use cane/ improve balance    OBJECTIVE:   DIAGNOSTIC FINDINGS: NA   COGNITION:  Overall cognitive status: Within functional limits for tasks assessed     SENSATION: WFL   LOWER EXTREMITY MMT:  MMT Right eval Left eval  Hip flexion 4 4  Hip extension    Hip abduction    Hip adduction    Hip internal rotation    Hip external rotation    Knee flexion 4 4  Knee extension 4 4  Ankle dorsiflexion 4 4  Ankle plantarflexion    Ankle inversion    Ankle eversion     (Blank rows = not tested)  FUNCTIONAL TESTS:  5 times sit to stand: 28.5 second with use of hands  2 minute walk test: 170 feet  GAIT: Distance walked: 170 feet Assistive device utilized: Walker - 2 wheeled Level of assistance: Modified independence and SBA Comments: flexed trunk, decreased stride  TODAY'S TREATMENT: 05/14/22  Goal review, discussion of symtoms and medical issues  Seated:  Marching 2X10   LAQ 2X10 with 5" holds   Sit to stands 10X from 21"  surface  Supine:  SLR 10X each LE   Bridge 10X    Eval  Functional testing HEP development    PATIENT EDUCATION:  Education details: on eval findings, POC and HEP  Person educated: Patient Education method: Explanation Education comprehension: verbalized understanding   HOME EXERCISE PROGRAM: Access Code: [email or web address] URL: [email or web address] Date: 05/01/2022 Prepared by: Josue Hector  Exercises - Seated Heel Toe Raises  - 2 x daily - 7 x weekly - 1 sets - 10 reps - Seated Long Arc Quad  - 2 x daily - 7 x weekly - 1 sets - 10 reps - Seated March  - 2 x daily - 7 x weekly - 1 sets - 10 reps - Supine Active Straight Leg Raise  - 7 x weekly - 1 sets - 10 reps  ASSESSMENT:  CLINICAL IMPRESSION: Reviewed goals and POC moving forward.  Pt able to recall her HEP and complete with minimal cues.  Comes today with difficulty getting out of chair in waiting room and slowly ambulating using RW.  Added sit to stands today with inability to complete without UE unless surface was raised 2 inches above standard chair height (21 inches).  Increased seated exercises to 2 sets this session.  Cues for proper breathing and pacing self.   Extension lag with SLR after 5 reps due to weakness.  Did add bridge as well this session.  Pt did well despite low Hgb, edema in LE and general fatigue.  Patient will benefit from skilled physical therapy services to address these deficits to reduce pain and improve level of function with ADLs and functional mobility tasks.    OBJECTIVE IMPAIRMENTS Abnormal gait, decreased activity tolerance, decreased balance, decreased mobility, difficulty walking, decreased ROM, decreased strength, improper body mechanics, and pain.   ACTIVITY LIMITATIONS lifting, bending, standing, squatting, stairs, transfers, and locomotion level  PARTICIPATION LIMITATIONS: meal prep, cleaning, laundry, shopping, community activity, and yard work  PERSONAL FACTORS  Past/current experiences and Time since onset of injury/illness/exacerbation are also affecting patient's functional outcome.   REHAB POTENTIAL: Good  CLINICAL DECISION MAKING: Stable/uncomplicated  EVALUATION COMPLEXITY: Low   GOALS: SHORT TERM GOALS: Target date: 05/29/2022  Patient will be independent with initial HEP and self-management strategies to improve functional outcomes Baseline:  Goal status: IN  PROGRESS  2. Patient will be able to stand, with minimal support, at least 5 minutes for improved ability to perform meal preparation/ cooking/ grooming/ cleaning ADLs.  Baseline: 2.5 minutes Goal status: IN PROGRESS  LONG TERM GOALS: Target date: 06/26/2022  Patient will be independent with advanced HEP and self-management strategies to improve functional outcomes Baseline:  Goal status: IN PROGRESS  2.  Patient will report at least 65% overall improvement in subjective complaint to indicate improvement in ability to perform ADLs. Baseline:  Goal status: IN PROGRESS  3.  Patient will be able to stand, with minimal support, at least 10 minutes for improved ability to perform meal preparation/ cooking/ grooming/ cleaning ADLs.  Baseline: 2.5 minutes Goal status: IN PROGRESS  4. Patient will have equal to or > 4+/5 MMT throughout BLE to improve ability to perform functional mobility, stair ambulation and ADLs.  Baseline: See MMT Goal status: IN PROGRESS  5. Patient will be able to ambulate at least 300 feet during 2MWT with LRAD to demonstrate improved ability to perform functional mobility and associated tasks. Baseline: 170 feet Goal status: IN PROGRESS  PLAN: PT FREQUENCY: 2x/week  PT DURATION: 8 weeks  PLANNED INTERVENTIONS: Therapeutic exercises, Therapeutic activity, Neuromuscular re-education, Balance training, Gait training, Patient/Family education, Joint manipulation, Joint mobilization, Stair training, Aquatic Therapy, Dry Needling, Electrical stimulation,  Spinal manipulation, Spinal mobilization, Cryotherapy, Moist heat, scar mobilization, Taping, Traction, Ultrasound, Biofeedback, Ionotophoresis 29m/ml Dexamethasone, and Manual therapy.   PLAN FOR NEXT SESSION: Progress functional LE strength, gait and balance.  Progress standing exercises next session and work on ambulation tolerance.  3:45 PM, 05/14/22 ATeena Irani PTA/CLT CDudleyvillePh: [phone]

## 2022-05-15 ENCOUNTER — Inpatient Hospital Stay: Payer: 59

## 2022-05-15 DIAGNOSIS — M6281 Muscle weakness (generalized): Secondary | ICD-10-CM | POA: Diagnosis not present

## 2022-05-15 DIAGNOSIS — D500 Iron deficiency anemia secondary to blood loss (chronic): Secondary | ICD-10-CM

## 2022-05-15 MED ORDER — SODIUM CHLORIDE 0.9% IV SOLUTION
250.0000 mL | Freq: Once | INTRAVENOUS | Status: AC
  Administered 2022-05-15: 250 mL via INTRAVENOUS

## 2022-05-15 MED ORDER — ACETAMINOPHEN 325 MG PO TABS
650.0000 mg | ORAL_TABLET | Freq: Once | ORAL | Status: DC
  Filled 2022-05-15: qty 2

## 2022-05-15 MED ORDER — DIPHENHYDRAMINE HCL 25 MG PO CAPS
25.0000 mg | ORAL_CAPSULE | Freq: Once | ORAL | Status: DC
  Filled 2022-05-15: qty 1

## 2022-05-15 NOTE — Progress Notes (Signed)
Patient presents today for 2 units of PRBC's. Blood pressure diastolic in the 70'W . Patient's normal trend. Patient has no complaints of any changes since his last visit. MAR reviewed and updated. Pre-medications for blood infusion taken prior to arrival. 626m Tylenol and 25 mg Benadryl PO.   2 Units of blood given today per MD orders. Tolerated infusion without adverse affects. Vital signs stable. No complaints at this time. Discharged from clinic by wheel chair in stable condition. Alert and oriented x 3. [facility]

## 2022-05-15 NOTE — Patient Instructions (Signed)
[facility]  Discharge Instructions: Thank you for choosing [facility] to provide your oncology and hematology care.  If you have a lab appointment with the [facility], please come in thru the Main Entrance and check in at the main information desk.  Wear comfortable clothing and clothing appropriate for easy access to any Portacath or PICC line.   We strive to give you quality time with your provider. You may need to reschedule your appointment if you arrive late (15 or more minutes).  Arriving late affects you and other patients whose appointments are after yours.  Also, if you miss three or more appointments without notifying the office, you may be dismissed from the clinic at the provider's discretion.      For prescription refill requests, have your pharmacy contact our office and allow 72 hours for refills to be completed.    Today you received the following:2 Units of PRBC's.       To help prevent nausea and vomiting after your treatment, we encourage you to take your nausea medication as directed.  BELOW ARE SYMPTOMS THAT SHOULD BE REPORTED IMMEDIATELY: *FEVER GREATER THAN 100.4 F (38 C) OR HIGHER *CHILLS OR SWEATING *NAUSEA AND VOMITING THAT IS NOT CONTROLLED WITH YOUR NAUSEA MEDICATION *UNUSUAL SHORTNESS OF BREATH *UNUSUAL BRUISING OR BLEEDING *URINARY PROBLEMS (pain or burning when urinating, or frequent urination) *BOWEL PROBLEMS (unusual diarrhea, constipation, pain near the anus) TENDERNESS IN MOUTH AND THROAT WITH OR WITHOUT PRESENCE OF ULCERS (sore throat, sores in mouth, or a toothache) UNUSUAL RASH, SWELLING OR PAIN  UNUSUAL VAGINAL DISCHARGE OR ITCHING   Items with * indicate a potential emergency and should be followed up as soon as possible or go to the Emergency Department if any problems should occur.  Please show the CHEMOTHERAPY ALERT CARD or IMMUNOTHERAPY ALERT CARD at check-in to the Emergency Department and triage  nurse.  Should you have questions after your visit or need to cancel or reschedule your appointment, please contact [facility] [phone]  and follow the prompts.  Office hours are 8:00 a.m. to 4:30 p.m. Monday - Friday. Please note that voicemails left after 4:00 p.m. may not be returned until the following business day.  We are closed weekends and major holidays. You have access to a nurse at all times for urgent questions. Please call the main number to the clinic [phone] and follow the prompts.  For any non-urgent questions, you may also contact your provider using MyChart. We now offer e-Visits for anyone 35 and older to request care online for non-urgent symptoms. For details visit [email or web address].   Also download the MyChart app! Go to the app store, search "MyChart", open the app, select [facility], and log in with your MyChart username and password.  Masks are optional in the cancer centers. If you would like for your care team to wear a mask while they are taking care of you, please let them know. You may have one support person who is at least 64 years old accompany you for your appointments.

## 2022-05-16 ENCOUNTER — Ambulatory Visit (HOSPITAL_COMMUNITY): Payer: 59 | Admitting: Physical Therapy

## 2022-05-16 ENCOUNTER — Ambulatory Visit
Admission: RE | Admit: 2022-05-16 | Discharge: 2022-05-16 | Disposition: A | Discharge status: Home / Self Care | Payer: 59 | Source: Ambulatory Visit | Attending: Interventional Radiology | Admitting: Interventional Radiology

## 2022-05-16 DIAGNOSIS — R600 Localized edema: Secondary | ICD-10-CM

## 2022-05-16 DIAGNOSIS — M6281 Muscle weakness (generalized): Secondary | ICD-10-CM | POA: Diagnosis not present

## 2022-05-16 DIAGNOSIS — R262 Difficulty in walking, not elsewhere classified: Secondary | ICD-10-CM

## 2022-05-16 DIAGNOSIS — K7581 Nonalcoholic steatohepatitis (NASH): Secondary | ICD-10-CM | POA: Diagnosis not present

## 2022-05-16 DIAGNOSIS — K643 Fourth degree hemorrhoids: Secondary | ICD-10-CM | POA: Insufficient documentation

## 2022-05-16 DIAGNOSIS — K7460 Unspecified cirrhosis of liver: Secondary | ICD-10-CM

## 2022-05-16 DIAGNOSIS — R2689 Other abnormalities of gait and mobility: Secondary | ICD-10-CM

## 2022-05-16 DIAGNOSIS — K649 Unspecified hemorrhoids: Secondary | ICD-10-CM | POA: Diagnosis not present

## 2022-05-16 HISTORY — DX: Fourth degree hemorrhoids: K64.3

## 2022-05-16 HISTORY — PX: IR RADIOLOGIST EVAL & MGMT: IMG5224

## 2022-05-16 LAB — TYPE AND SCREEN
ABO/RH(D): A POS
Antibody Screen: NEGATIVE
Unit division: 0
Unit division: 0

## 2022-05-16 LAB — BPAM RBC
Blood Product Expiration Date: 202310012359
Blood Product Expiration Date: 202310122359
ISSUE DATE / TIME: 202309281029
ISSUE DATE / TIME: 202309281219
Unit Type and Rh: 600
Unit Type and Rh: 6200

## 2022-05-16 NOTE — Progress Notes (Signed)
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  She is considered a poor surgical candidate.  Therefore, she is status post superior rectal artery embolization on 02/07/22 at [facility].  The procedure went as planned without complication.  She was discharged home on 02/10/22 after no further bleeding was noted and her anemia was stable.  Unfortunately, some bleeding continued after discharge and was related to increased ascites, therefore was re-admitted requiring additional blood transfusion from 03/05/22 to 03/07/22.  Additional admission with transfusions was required from 03/20/22-03/28/22.  Since discharge, she has had scant, intermittent bleeding.  She is being followed weekly for CBC and transfusions as needed, the last yesterday.     She continues to endorse significant leg swelling and has recurrent ascites (s/p TIPS revision 03/12/22).  The swelling limits her mobility and requires the use of a walker.  Follow up at [facility] with IR scheduled for November.    She continues to endorse large volume ascites, with paracentesis  yesterday draining 8 liters.  She has persistent leg swelling which limits mobility.  No significant change since perior visit.  She endorses a small amount of rectal bleeding earlier this week on Monday and Tuesday, but has gone 3 days without.  She had CBC drawn yesterday with Hb 6.8, which prompted a transfusion of 2 units.  She tolerated this well.   Past Medical History:  Diagnosis Date   Anxiety    Arthritis    Cirrhosis of liver ([place])    Diabetes mellitus without complication (HCC)    Dyspnea    DOE   GERD (gastroesophageal reflux disease)    Hepatitis    PAST   Hypertension     Neuropathy    Neuropathy, diabetic ([place])    Pneumonia    Sinus complaint    Sleep apnea    CPAP   Thalassemia minor 1992    Past Surgical History:  Procedure Laterality Date   ABDOMINAL HYSTERECTOMY     BREAST BIOPSY Right 02/15/2018   [place] bx 6-6:30 ribbon shape, ONE CORE FRAGMENT WITH FIBROSIS. ONE CORE FRAGMENT WITH PORTION OF A DILATED   BREAST BIOPSY Right 02/15/2018   [place] bx 9:00 heart shape, USUAL DUCTAL HYPERPLASIA   BREAST LUMPECTOMY Right 03/09/2018   Procedure: BREAST LUMPECTOMY x 2;  Surgeon: Benjamine Sprague, DO;  Location: [facility] ORS;  Service: General;  Laterality: Right;   CATARACT EXTRACTION W/PHACO Left 06/11/2016   Procedure: CATARACT EXTRACTION PHACO AND INTRAOCULAR LENS PLACEMENT (IOC);  Surgeon: Estill Cotta, MD;  Location: [facility] ORS;  Service: Ophthalmology;  Laterality: Left;  Lot # [id number] H US:01:38.6 AP%:26.4 CDE:44.15   CATARACT EXTRACTION W/PHACO Right 06/15/2018   Procedure: CATARACT EXTRACTION PHACO AND INTRAOCULAR LENS PLACEMENT (IOC);  Surgeon: Birder Robson, MD;  Location: [facility] ORS;  Service: Ophthalmology;  Laterality: Right;  [place] 00:38.2 CDE 4.23 Fluid Pack Lot # [id number]   COLONOSCOPIES     COLONOSCOPY WITH PROPOFOL N/A 10/10/2020   Procedure: COLONOSCOPY WITH PROPOFOL;  Surgeon: Lesly Rubenstein, MD;  Location[facility] ENDOSCOPY;  Service: Endoscopy;  Laterality: N/A;   COLONOSCOPY WITH PROPOFOL N/A 11/20/2020   Procedure: COLONOSCOPY WITH PROPOFOL;  Surgeon: Lesly Rubenstein, MD;  Location: [facility] ENDOSCOPY;  Service: Endoscopy;  Laterality: N/A;  DM STAT  CBC, BMP COVID POSITIVE 09/02/2020   CTR     ESOPHAGOGASTRODUODENOSCOPY (EGD) WITH PROPOFOL N/A 10/10/2020   Procedure: ESOPHAGOGASTRODUODENOSCOPY (EGD) WITH PROPOFOL;  Surgeon: Lesly Rubenstein, MD;  Location: [facility] ENDOSCOPY;  Service: Endoscopy;  Laterality: N/A;  COVID POSITIVE 10/08/2020   FLEXIBLE SIGMOIDOSCOPY N/A 02/06/2022   Procedure: FLEXIBLE SIGMOIDOSCOPY;  Surgeon: Lesly Rubenstein, MD;  Location: [facility] ENDOSCOPY;  Service: Endoscopy;  Laterality: N/A;  Patient requests anesthesia   IR ANGIOGRAM SELECTIVE EACH ADDITIONAL VESSEL  02/07/2022   IR ANGIOGRAM SELECTIVE EACH ADDITIONAL VESSEL  02/07/2022   IR ANGIOGRAM VISCERAL SELECTIVE  02/07/2022   IR EMBO ARTERIAL NOT HEMORR HEMANG INC GUIDE ROADMAPPING  02/07/2022   IR PARACENTESIS  12/17/2021   IR PARACENTESIS  03/25/2022   IR RADIOLOGIST EVAL & MGMT  03/18/2022   IR RADIOLOGIST EVAL & MGMT  05/07/2022   IR US GUIDE VASC ACCESS RIGHT  02/07/2022   JOINT REPLACEMENT     KNEE SURGERY Right 09/24/2017   plates and pins   TOTAL SHOULDER ARTHROPLASTY Right 09/27/2015    Allergies: Gramineae pollens and Latex  Medications: Prior to Admission medications   Medication Sig Start Date End Date Taking? Authorizing Provider  acetaminophen (TYLENOL) 650 MG CR tablet Take 1,300 mg by mouth daily as needed for pain.    [provider]  albuterol (VENTOLIN HFA) 108 (90 Base) MCG/ACT inhaler Inhale 2 puffs into the lungs every 6 (six) hours as needed for wheezing or shortness of breath. 02/10/22   Loletha Grayer, MD  atorvastatin (LIPITOR) 10 MG tablet Take 10 mg by mouth daily. 01/28/21   [provider]  diphenhydrAMINE (BENADRYL) 25 MG tablet Take 25 mg by mouth 2 (two) times daily as needed for itching.    [provider]  ergocalciferol (VITAMIN D2) 1.25 MG (50000 UT) capsule Take 1 capsule (50,000 Units total) by mouth once a week. 04/02/22   Harriett Rush, PA-C  gabapentin (NEURONTIN) 400 MG capsule Take 800 mg by mouth 2 (two) times daily. 01/07/18   [provider]  hydrocortisone (ANUSOL-HC) 2.5 % rectal cream Place rectally 4 (four) times daily. 03/07/22   Loletha Grayer, MD  lactulose, encephalopathy, (CHRONULAC) 10 GM/15ML SOLN Take 30 mLs (20 g total) by mouth daily. 01/30/22   Roxan Hockey, MD  melatonin 5 MG TABS Take 10 mg by mouth at bedtime as needed (sleep).    [provider]  Multiple Vitamins-Minerals (MULTIVITAMIN WITH MINERALS) tablet Take 1 tablet by mouth daily.    [provider]  sertraline (ZOLOFT) 50 MG tablet Take 50 mg by mouth daily. 12/12/17   [provider]  sodium chloride (OCEAN) 0.65 % SOLN nasal spray Place 1 spray into both nostrils as needed for congestion. Patient taking differently: Place 1 spray into both nostrils at bedtime. 02/10/22   Loletha Grayer, MD  vitamin B-12 (CYANOCOBALAMIN) 1000 MCG tablet Take 1,000 mcg by mouth daily.    [provider]  witch hazel-glycerin (TUCKS) pad Apply topically as needed for hemorrhoids. 03/07/22   Loletha Grayer, MD     Family History  Problem Relation Age of Onset   Breast cancer Mother 30   Lymphoma Mother    Diabetes Father    Kidney cancer Father    Heart disease Father    Diabetes Sister    Breast cancer Sister 65    Social History   Socioeconomic History   Marital status: Married    Spouse name: Not on file  Number of children: 0   Years of education: Not on file   Highest education level: Not on file  Occupational History   Occupation: EMPLOYED  Tobacco Use   Smoking status: Never   Smokeless tobacco: Never  Vaping Use   Vaping Use: Never used  Substance and Sexual Activity   Alcohol use: No   Drug use: Never   Sexual activity: Not Currently  Other Topics Concern   Not on file  Social History Narrative   Not on file   Social Determinants of Health   Financial Resource Strain: Low Risk  (04/04/2022)   Overall Financial Resource Strain (CARDIA)    Difficulty of Paying Living Expenses: Not hard at all  Food Insecurity: Unknown (04/04/2022)   Hunger Vital Sign    Worried About Running Out of Food in the Last Year: Not on file    Ran Out of Food in the Last Year: Never true  Transportation Needs: No Transportation Needs (04/04/2022)   PRAPARE - Hydrologist (Medical): No    Lack of Transportation  (Non-Medical): No  Physical Activity: Inactive (04/04/2022)   Exercise Vital Sign    Days of Exercise per Week: 0 days    Minutes of Exercise per Session: 0 min  Stress: No Stress Concern Present (04/04/2022)   [place]    Feeling of Stress : Only a little  Social Connections: Moderately Isolated (04/04/2022)   Social Connection and Isolation Panel [NHANES]    Frequency of Communication with Friends and Family: More than three times a week    Frequency of Social Gatherings with Friends and Family: Three times a week    Attends Religious Services: Never    Active Member of Clubs or Organizations: No    Attends Archivist Meetings: Never    Marital Status: Married     Vital Signs: There were no vitals taken for this visit.  No physical examination was performed in lieu of virtual telephone clinic visit.   Imaging: TIPS Duplex 05/13/22 FINDINGS: Portal Vein Velocities  Main: 34 cm/sec  Right: 96 cm/sec  Left: 20 cm/sec  TIPS Stent Velocities  Proximal: 77 cm/sec  Mid: 96  Distal: 163 cm/sec  IVC: Present and patent with normal respiratory phasicity.  Hepatic Vein Velocities  Right: 27 cm/sec  Mid: 15 cm/sec  Left: 21 cm/sec  Splenic Vein: 33 cm/sec  Superior Mesenteric Vein: 32 cm/sec  Hepatic Artery: 136 cm/sec  Ascities: Present, small volume  Varices: None seen  Other findings: Micronodular hepatic contour. Spleen 11.9 x 17.8 x 8.2 cm (912 cc).  IMPRESSION: 1. Patent TIPS . No significant velocity change to suggest stenosis or occlusion. 2. Splenomegaly and small volume abdominal ascites.     Labs:  CBC: Recent Labs    04/23/22 1338 04/30/22 1329 05/07/22 1351 05/14/22 1145  WBC 3.7* 3.8* 2.9* 3.4*  HGB 7.9* 5.6* 6.6* 6.8*  HCT 26.0* 18.4* 21.0* 22.1*  PLT 136* 130* 134* 148*    COAGS: Recent Labs    01/28/22 1707 02/04/22 1245 03/05/22 1650  03/22/22 0206  INR 1.2 1.3* 1.2 1.3*  APTT  --  31  --  30    BMP: Recent Labs    03/25/22 0354 03/26/22 0313 03/27/22 0401 04/02/22 1035  NA 139 138 138 137  K 3.4* 3.5 3.8 4.2  CL 111 111 112* 110  CO2 22 23 22 23   GLUCOSE 144* 160* 143*  134*  BUN 25* 25* 28* 38*  CALCIUM 8.3* 8.0* 8.1* 8.1*  CREATININE 1.29* 1.19* 1.20* 1.49*  GFRNONAA 46* 51* 51* 39*    LIVER FUNCTION TESTS: Recent Labs    03/22/22 0206 03/25/22 0354 03/26/22 0313 04/02/22 1035  BILITOT 3.0* 1.9* 1.7* 1.3*  AST 48* 37 37 46*  ALT 24 19 20 27   ALKPHOS 82 70 75 136*  PROT 3.9* 3.9* 4.0* 5.1*  ALBUMIN 2.1* 1.9* 1.9* 2.4*    Assessment and Plan: 66 year old female with history of NASH cirrhosis and grade 4 hemorrhagic internal hemorrhoids status post bilateral superior rectal artery embolization on 02/10/22.  This was initially successful in significantly decreasing her bleeding, however bleeding has returned, though not as significant as prior to procedure.  She is status post TIPS creation in May 2023 at [facility], which has not alleviated her ascites production, therefore underwent TIPS revision (expansion to 10 mm) on 03/12/22.  Etiology of continued GI bleed and anemia is likely multifactorial from hematologic/coagulopathic, portal hypertension, anatomic standpoints.  I am skeptical that bleeding is from hemorrhoids after embolization, however certainly possible.   Contingency plan for any future admissions for severe anemia in the setting of persistent GI bleed:  nuclear medicine tagged RBC scan to assess for location of GI bleeding - if from rectum, would then proceed to angiogram with possible repeat embolization.  Please notify IR and/or myself in this case.   Follow up with IR as needed.  Electronically Signed: Rosanne Ashing Crosby Oriordan 05/16/2022, 10:02 AM   I spent a total of 15 Minutes in virtual telephone clinical consultation, greater than 50% of which was counseling/coordinating care for internal  hemorrhoids and portal hypertension.
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
## 2022-05-19 ENCOUNTER — Ambulatory Visit (HOSPITAL_COMMUNITY): Payer: 59 | Attending: Internal Medicine | Admitting: Physical Therapy

## 2022-05-19 DIAGNOSIS — R2689 Other abnormalities of gait and mobility: Secondary | ICD-10-CM | POA: Insufficient documentation

## 2022-05-19 DIAGNOSIS — K7460 Unspecified cirrhosis of liver: Secondary | ICD-10-CM | POA: Diagnosis not present

## 2022-05-19 DIAGNOSIS — R262 Difficulty in walking, not elsewhere classified: Secondary | ICD-10-CM | POA: Diagnosis not present

## 2022-05-19 DIAGNOSIS — M6281 Muscle weakness (generalized): Secondary | ICD-10-CM | POA: Insufficient documentation

## 2022-05-19 NOTE — Therapy (Signed)
OUTPATIENT PHYSICAL THERAPY TREATMENT  Patient Name: Rachel Huber MRN: [id number] DOB:[birth year], 66 y.o., female Today's Date: 05/19/2022   PT End of Session - 05/19/22 0959     Visit Number 4    Number of Visits 18    Date for PT Re-Evaluation 06/26/22    Authorization Type [employer] (no VL)    Progress Note Due on Visit 10    PT Start Time 0955    PT Stop Time 1030    PT Time Calculation (min) 35 min    Activity Tolerance Patient tolerated treatment well    Behavior During Therapy WFL for tasks assessed/performed              Past Medical History:  Diagnosis Date   Anxiety    Arthritis    Cirrhosis of liver ([place])    Diabetes mellitus without complication ([place])    Dyspnea    DOE   GERD (gastroesophageal reflux disease)    Grade IV internal hemorrhoids    Contingency plan for any future admissions for severe anemia in the setting of persistent GI bleed:  nuclear medicine tagged RBC scan to assess for location of GI bleeding - if from rectum, would then proceed to angiogram with possible repeat embolization.  Please notify IR in this case.   Hepatitis    PAST   Hypertension    Neuropathy    Neuropathy, diabetic ([place])    Pneumonia    Sinus complaint    Sleep apnea    CPAP   Thalassemia minor 1992   Past Surgical History:  Procedure Laterality Date   ABDOMINAL HYSTERECTOMY     BREAST BIOPSY Right 02/15/2018   [place] bx 6-6:30 ribbon shape, ONE CORE FRAGMENT WITH FIBROSIS. ONE CORE FRAGMENT WITH PORTION OF A DILATED   BREAST BIOPSY Right 02/15/2018   [place] bx 9:00 heart shape, USUAL DUCTAL HYPERPLASIA   BREAST LUMPECTOMY Right 03/09/2018   Procedure: BREAST LUMPECTOMY x 2;  Surgeon: Benjamine Sprague, DO;  Location: [facility] ORS;  Service: General;  Laterality: Right;   CATARACT EXTRACTION W/PHACO Left 06/11/2016   Procedure: CATARACT EXTRACTION PHACO AND INTRAOCULAR LENS PLACEMENT (IOC);  Surgeon: Estill Cotta, MD;  Location: [facility] ORS;  Service: Ophthalmology;   Laterality: Left;  Lot # [id number] H US:01:38.6 AP%:26.4 CDE:44.15   CATARACT EXTRACTION W/PHACO Right 06/15/2018   Procedure: CATARACT EXTRACTION PHACO AND INTRAOCULAR LENS PLACEMENT (IOC);  Surgeon: Birder Robson, MD;  Location: [facility] ORS;  Service: Ophthalmology;  Laterality: Right;  [place] 00:38.2 CDE 4.23 Fluid Pack Lot # [id number]   COLONOSCOPIES     COLONOSCOPY WITH PROPOFOL N/A 10/10/2020   Procedure: COLONOSCOPY WITH PROPOFOL;  Surgeon: Lesly Rubenstein, MD;  Location: [facility] ENDOSCOPY;  Service: Endoscopy;  Laterality: N/A;   COLONOSCOPY WITH PROPOFOL N/A 11/20/2020   Procedure: COLONOSCOPY WITH PROPOFOL;  Surgeon: Lesly Rubenstein, MD;  Location: [facility] ENDOSCOPY;  Service: Endoscopy;  Laterality: N/A;  DM STAT CBC, BMP COVID POSITIVE 09/02/2020   CTR     ESOPHAGOGASTRODUODENOSCOPY (EGD) WITH PROPOFOL N/A 10/10/2020   Procedure: ESOPHAGOGASTRODUODENOSCOPY (EGD) WITH PROPOFOL;  Surgeon: Lesly Rubenstein, MD;  Location: [facility] ENDOSCOPY;  Service: Endoscopy;  Laterality: N/A;  COVID POSITIVE 10/08/2020   FLEXIBLE SIGMOIDOSCOPY N/A 02/06/2022   Procedure: FLEXIBLE SIGMOIDOSCOPY;  Surgeon: Lesly Rubenstein, MD;  Location: [facility] ENDOSCOPY;  Service: Endoscopy;  Laterality: N/A;  Patient requests anesthesia   IR ANGIOGRAM SELECTIVE EACH ADDITIONAL VESSEL  02/07/2022   IR ANGIOGRAM SELECTIVE EACH ADDITIONAL VESSEL  02/07/2022  IR ANGIOGRAM VISCERAL SELECTIVE  02/07/2022   IR EMBO ARTERIAL NOT HEMORR HEMANG INC GUIDE ROADMAPPING  02/07/2022   IR PARACENTESIS  12/17/2021   IR PARACENTESIS  03/25/2022   IR RADIOLOGIST EVAL & MGMT  03/18/2022   IR RADIOLOGIST EVAL & MGMT  05/07/2022   IR RADIOLOGIST EVAL & MGMT  05/16/2022   IR US GUIDE VASC ACCESS RIGHT  02/07/2022   JOINT REPLACEMENT     KNEE SURGERY Right 09/24/2017   plates and pins   TOTAL SHOULDER ARTHROPLASTY Right 09/27/2015   Patient Active Problem List   Diagnosis Date Noted   Grade IV internal hemorrhoids 05/16/2022   Acute blood loss  anemia 03/20/2022   Acute on chronic blood loss anemia 03/05/2022   Thrombocytopenia ([place]) 03/05/2022   Fever 02/08/2022   Pancytopenia ([place]) 02/07/2022   GI bleeding 02/04/2022   Chronic kidney disease, stage 3b ([place]) 02/04/2022   Depression with anxiety 02/04/2022   Liver cirrhosis secondary to NASH ([place])    Acute GI hemorrhage 01/28/2022   AKI (acute kidney injury) ([place]) 01/27/2022   Insomnia 01/27/2022   Iron deficiency anemia due to chronic blood loss 02/13/2021   History of uterine cancer 02/13/2021   Decompensated hepatic cirrhosis ([place]) 10/25/2020   OSA on CPAP 10/25/2020   Glossitis 10/25/2020   Hyponatremia 10/08/2020   Acute respiratory failure with hypoxia ([place]) 09/10/2020   Pneumonia due to COVID-19 virus 09/08/2020   Hip fracture, right ([place]) 09/04/2020   Closed right hip fracture ([place]) 09/02/2020   Type 2 diabetes mellitus with hyperlipidemia ([place]) 09/02/2020   Thalassemia minor    HLD (hyperlipidemia)    Acute kidney injury superimposed on CKD ([place])    Depression    Fall at home, initial encounter    Nondisplaced fracture of greater trochanter of right femur, initial encounter for closed fracture ([place])     PCP:  [place] MD  REFERRING PROVIDER: Tinnie Gens, NP   REFERRING DIAG: PT eval/tx for strength and conditioning   THERAPY DIAG:  No diagnosis found.  Rationale for Evaluation and Treatment Rehabilitation  ONSET DATE: Chronic  SUBJECTIVE:   SUBJECTIVE STATEMENT: PT states she is stiff today.  States she did her HEP once a day but didn't do much walking.  States she's been having someone "pull her out of the chair"   PERTINENT HISTORY: Falls, weakness,DM, R shoulder reverse replacement anFeb 2019 R knee pain ([place])   PAIN:  Are you having pain? Yes: NPRS scale: 4/10 Pain location: B knee Pain description: aching  Aggravating factors: use, movement, WB Relieving factors: rest, meds, non WB  PRECAUTIONS: Fall  WEIGHT  BEARING RESTRICTIONS No  FALLS:  Has patient fallen in last 6 months? Yes. Number of falls 1  LIVING ENVIRONMENT: Lives with: lives with their family and lives with their spouse Lives in: House/apartment Stairs: No Has following equipment at home: Single point cane and Environmental consultant - 2 wheeled  OCCUPATION: Retired/ Disability   PLOF: Needs assistance with ADLs  PATIENT GOALS  Get to point of not having to use cane/ improve balance    OBJECTIVE:   DIAGNOSTIC FINDINGS: NA   COGNITION:  Overall cognitive status: Within functional limits for tasks assessed     SENSATION: WFL   LOWER EXTREMITY MMT:  MMT Right eval Left eval  Hip flexion 4 4  Hip extension    Hip abduction    Hip adduction    Hip internal rotation    Hip external rotation  Knee flexion 4 4  Knee extension 4 4  Ankle dorsiflexion 4 4  Ankle plantarflexion    Ankle inversion    Ankle eversion     (Blank rows = not tested)  FUNCTIONAL TESTS:  5 times sit to stand: 28.5 second with use of hands  2 minute walk test: 170 feet  GAIT: Distance walked: 170 feet Assistive device utilized: Walker - 2 wheeled Level of assistance: Modified independence and SBA Comments: flexed trunk, decreased stride  TODAY'S TREATMENT:     05/19/22                        Standing:  Heel raises  20X                         Toeraises 20X Marching 10X 2 sets 1 HHA with intermittent 2HHA                        Mini squat 10X 2 sets                         Side step at // x 2 RT    Sit to stand x10 no UE's from standard chair, no UE    Ambulation around clinic with RW for activity tolerance X150 feet without rest                     05/16/22                        Standing:                        Heel raises x 10                        Marching x 10                         Mini squat x 10                         Side step at // x 2 RT                         Sit to stand x10  05/14/22  Goal review, discussion of  symtoms and medical issues  Seated:  Marching 2X10   LAQ 2X10 with 5" holds   Sit to stands 10X from 21" surface  Supine:  SLR 10X each LE   Bridge 10X    Eval  Functional testing HEP development    PATIENT EDUCATION:  Education details: on eval findings, POC and HEP  Person educated: Patient Education method: Explanation Education comprehension: verbalized understanding   HOME EXERCISE PROGRAM:             04/19/22 - Heel Raises with Counter Support  - 2 x daily - 7 x weekly - 1 sets - 10 reps - 3-5" hold - Mini Squat with Counter Support  - 2 x daily - 7 x weekly - 1 sets - 10 reps - 3-5" hold - Sit to Stand with Counter Support  - 2 x daily - 7 x weekly - 1 sets - 10 reps9/  Access Code: [email or web address] URL: [email or web address] Date: 05/01/2022 Prepared by: Josue Hector  Exercises - Seated Heel Toe Raises  - 2 x daily - 7 x weekly - 1 sets - 10 reps - Seated Long Arc Quad  - 2 x daily - 7 x weekly - 1 sets - 10 reps - Seated March  - 2 x daily - 7 x weekly - 1 sets - 10 reps - Supine Active Straight Leg Raise  - 7 x weekly - 1 sets - 10 reps  ASSESSMENT:  CLINICAL IMPRESSION: Pt with limited mobility/exericse at home.  Discussed again with pt the importance of increasing her activity at home and need to walk 3 times a day to improve her endurance.  Continued with standing activity.  Attempted UE assist reduction, however still requires intermittent bil HHA assist due to weakness and balance issues. Noted increased edema present today in abdomen, Lt UE and LE's with paracentesis scheduled for tomorrow.  Completed ambulation at end of session with slow gait, cues to clear toe and larger steps.  Able to complete 150 feet prior to rest needed. PT continues to have decreased strength, decreased mobility  and  decreased activity tolerance  Patient will benefit from skilled physical therapy services to address these deficits to reduce pain and improve level of  function with ADLs and functional mobility tasks.    OBJECTIVE IMPAIRMENTS Abnormal gait, decreased activity tolerance, decreased balance, decreased mobility, difficulty walking, decreased ROM, decreased strength, improper body mechanics, and pain.   ACTIVITY LIMITATIONS lifting, bending, standing, squatting, stairs, transfers, and locomotion level  PARTICIPATION LIMITATIONS: meal prep, cleaning, laundry, shopping, community activity, and yard work  PERSONAL FACTORS Past/current experiences and Time since onset of injury/illness/exacerbation are also affecting patient's functional outcome.   REHAB POTENTIAL: Good  CLINICAL DECISION MAKING: Stable/uncomplicated  EVALUATION COMPLEXITY: Low   GOALS: SHORT TERM GOALS: Target date: 05/29/2022  Patient will be independent with initial HEP and self-management strategies to improve functional outcomes Baseline:  Goal status: IN PROGRESS  2. Patient will be able to stand, with minimal support, at least 5 minutes for improved ability to perform meal preparation/ cooking/ grooming/ cleaning ADLs.  Baseline: 2.5 minutes Goal status: IN PROGRESS  LONG TERM GOALS: Target date: 06/26/2022  Patient will be independent with advanced HEP and self-management strategies to improve functional outcomes Baseline:  Goal status: IN PROGRESS  2.  Patient will report at least 65% overall improvement in subjective complaint to indicate improvement in ability to perform ADLs. Baseline:  Goal status: IN PROGRESS  3.  Patient will be able to stand, with minimal support, at least 10 minutes for improved ability to perform meal preparation/ cooking/ grooming/ cleaning ADLs.  Baseline: 2.5 minutes Goal status: IN PROGRESS  4. Patient will have equal to or > 4+/5 MMT throughout BLE to improve ability to perform functional mobility, stair ambulation and ADLs.  Baseline: See MMT Goal status: IN PROGRESS  5. Patient will be able to ambulate at least 300 feet  during 2MWT with LRAD to demonstrate improved ability to perform functional mobility and associated tasks. Baseline: 170 feet Goal status: IN PROGRESS  PLAN: PT FREQUENCY: 2x/week  PT DURATION: 8 weeks  PLANNED INTERVENTIONS: Therapeutic exercises, Therapeutic activity, Neuromuscular re-education, Balance training, Gait training, Patient/Family education, Joint manipulation, Joint mobilization, Stair training, Aquatic Therapy, Dry Needling, Electrical stimulation, Spinal manipulation, Spinal mobilization, Cryotherapy, Moist heat, scar mobilization, Taping, Traction, Ultrasound, Biofeedback, Ionotophoresis 21m/ml Dexamethasone, and Manual therapy.   PLAN FOR NEXT SESSION: Progress functional  LE strength, work on ambulation tolerance.  Teena Irani, PTA/CLT [place] Ph: [phone]  Teena Irani, PTA 05/19/2022, 11:00 AM

## 2022-05-20 ENCOUNTER — Ambulatory Visit
Admission: RE | Admit: 2022-05-20 | Discharge: 2022-05-20 | Disposition: A | Discharge status: Home / Self Care | Payer: 59 | Source: Ambulatory Visit | Attending: Gastroenterology | Admitting: Gastroenterology

## 2022-05-20 DIAGNOSIS — K7469 Other cirrhosis of liver: Secondary | ICD-10-CM | POA: Diagnosis not present

## 2022-05-20 DIAGNOSIS — R188 Other ascites: Secondary | ICD-10-CM | POA: Diagnosis not present

## 2022-05-20 DIAGNOSIS — K7460 Unspecified cirrhosis of liver: Secondary | ICD-10-CM | POA: Diagnosis present

## 2022-05-20 DIAGNOSIS — K7581 Nonalcoholic steatohepatitis (NASH): Secondary | ICD-10-CM | POA: Diagnosis present

## 2022-05-20 MED ORDER — ALBUMIN HUMAN 25 % IV SOLN: 25.0000 g | Freq: Once | INTRAVENOUS | Status: DC

## 2022-05-20 MED ORDER — ALBUMIN HUMAN 25 % IV SOLN
INTRAVENOUS | Status: AC
  Administered 2022-05-20: 25 g via INTRAVENOUS
  Filled 2022-05-20: qty 100

## 2022-05-20 MED ORDER — ALBUMIN HUMAN 25 % IV SOLN: 25.0000 g | Freq: Once | INTRAVENOUS | Status: AC

## 2022-05-20 NOTE — Procedures (Signed)
PROCEDURE SUMMARY:  Successful US guided paracentesis from RUQ.  Yielded 8L of ascitic fluid.  No immediate complications.  Pt tolerated well.   Specimen not sent for labs.  EBL < 50m  Tashawna Thom PA-C 05/20/2022 2:36 PM

## 2022-05-21 ENCOUNTER — Inpatient Hospital Stay: Payer: 59

## 2022-05-21 ENCOUNTER — Inpatient Hospital Stay: Payer: 59 | Attending: Hematology | Admitting: Hematology

## 2022-05-21 VITALS — BP 105/64 | HR 70 | Temp 97.2°F | Resp 20

## 2022-05-21 DIAGNOSIS — I129 Hypertensive chronic kidney disease with stage 1 through stage 4 chronic kidney disease, or unspecified chronic kidney disease: Secondary | ICD-10-CM | POA: Insufficient documentation

## 2022-05-21 DIAGNOSIS — R188 Other ascites: Secondary | ICD-10-CM | POA: Insufficient documentation

## 2022-05-21 DIAGNOSIS — Z90710 Acquired absence of both cervix and uterus: Secondary | ICD-10-CM | POA: Diagnosis not present

## 2022-05-21 DIAGNOSIS — D563 Thalassemia minor: Secondary | ICD-10-CM

## 2022-05-21 DIAGNOSIS — D500 Iron deficiency anemia secondary to blood loss (chronic): Secondary | ICD-10-CM | POA: Diagnosis present

## 2022-05-21 DIAGNOSIS — K7460 Unspecified cirrhosis of liver: Secondary | ICD-10-CM | POA: Diagnosis not present

## 2022-05-21 DIAGNOSIS — Z8051 Family history of malignant neoplasm of kidney: Secondary | ICD-10-CM | POA: Diagnosis not present

## 2022-05-21 DIAGNOSIS — N189 Chronic kidney disease, unspecified: Secondary | ICD-10-CM | POA: Diagnosis not present

## 2022-05-21 DIAGNOSIS — E559 Vitamin D deficiency, unspecified: Secondary | ICD-10-CM | POA: Insufficient documentation

## 2022-05-21 DIAGNOSIS — E1122 Type 2 diabetes mellitus with diabetic chronic kidney disease: Secondary | ICD-10-CM | POA: Diagnosis not present

## 2022-05-21 DIAGNOSIS — Z803 Family history of malignant neoplasm of breast: Secondary | ICD-10-CM | POA: Diagnosis not present

## 2022-05-21 DIAGNOSIS — E538 Deficiency of other specified B group vitamins: Secondary | ICD-10-CM | POA: Diagnosis not present

## 2022-05-21 DIAGNOSIS — Z8542 Personal history of malignant neoplasm of other parts of uterus: Secondary | ICD-10-CM | POA: Diagnosis not present

## 2022-05-21 DIAGNOSIS — D561 Beta thalassemia: Secondary | ICD-10-CM | POA: Diagnosis not present

## 2022-05-21 DIAGNOSIS — Z807 Family history of other malignant neoplasms of lymphoid, hematopoietic and related tissues: Secondary | ICD-10-CM | POA: Insufficient documentation

## 2022-05-21 LAB — IRON AND TIBC
Iron: 105 ug/dL (ref 28–170)
Saturation Ratios: 64 % — ABNORMAL HIGH (ref 10.4–31.8)
TIBC: 163 ug/dL — ABNORMAL LOW (ref 250–450)
UIBC: 58 ug/dL

## 2022-05-21 LAB — CBC
HCT: 20.8 % — ABNORMAL LOW (ref 36.0–46.0)
Hemoglobin: 6.5 g/dL — CL (ref 12.0–15.0)
MCH: 24.9 pg — ABNORMAL LOW (ref 26.0–34.0)
MCHC: 31.3 g/dL (ref 30.0–36.0)
MCV: 79.7 fL — ABNORMAL LOW (ref 80.0–100.0)
Platelets: 133 10*3/uL — ABNORMAL LOW (ref 150–400)
RBC: 2.61 MIL/uL — ABNORMAL LOW (ref 3.87–5.11)
RDW: 21.4 % — ABNORMAL HIGH (ref 11.5–15.5)
WBC: 3.4 10*3/uL — ABNORMAL LOW (ref 4.0–10.5)
nRBC: 0 % (ref 0.0–0.2)

## 2022-05-21 LAB — COMPREHENSIVE METABOLIC PANEL
ALT: 22 U/L (ref 0–44)
AST: 38 U/L (ref 15–41)
Albumin: 2.6 g/dL — ABNORMAL LOW (ref 3.5–5.0)
Alkaline Phosphatase: 102 U/L (ref 38–126)
Anion gap: 4 — ABNORMAL LOW (ref 5–15)
BUN: 38 mg/dL — ABNORMAL HIGH (ref 8–23)
CO2: 22 mmol/L (ref 22–32)
Calcium: 8.3 mg/dL — ABNORMAL LOW (ref 8.9–10.3)
Chloride: 112 mmol/L — ABNORMAL HIGH (ref 98–111)
Creatinine, Ser: 1.68 mg/dL — ABNORMAL HIGH (ref 0.44–1.00)
GFR, Estimated: 34 mL/min — ABNORMAL LOW (ref >60)
Glucose, Bld: 155 mg/dL — ABNORMAL HIGH (ref 70–99)
Potassium: 4.5 mmol/L (ref 3.5–5.1)
Sodium: 138 mmol/L (ref 135–145)
Total Bilirubin: 2.2 mg/dL — ABNORMAL HIGH (ref 0.3–1.2)
Total Protein: 4.5 g/dL — ABNORMAL LOW (ref 6.5–8.1)

## 2022-05-21 LAB — FERRITIN: Ferritin: 283 ng/mL (ref 11–307)

## 2022-05-21 LAB — PREPARE RBC (CROSSMATCH)

## 2022-05-21 MED ORDER — ACETAMINOPHEN 325 MG PO TABS
650.0000 mg | ORAL_TABLET | Freq: Once | ORAL | Status: AC
  Administered 2022-05-21: 650 mg via ORAL
  Filled 2022-05-21: qty 2

## 2022-05-21 MED ORDER — SODIUM CHLORIDE 0.9% IV SOLUTION
250.0000 mL | Freq: Once | INTRAVENOUS | Status: AC
  Administered 2022-05-21: 250 mL via INTRAVENOUS

## 2022-05-21 NOTE — Progress Notes (Signed)
CRITICAL VALUE ALERT Critical value received:  HGB 6.5.  Date of notification:  05-21-2022.  Time of notification: 11:06 am  Critical value read back:  Yes.   Nurse who received alert:  B. Quanisha Drewry RN MD notified time and response:  R. Pennington PA @ 11:17 am.  Orders received to infuse 2 units of PRBC's.

## 2022-05-21 NOTE — Progress Notes (Signed)
Patient presents today for 2 units of blood per provider orders, patient reports taking Benadryl at home, Tylenol given as pre-med. Patient tolerated blood transfusion with no complaints voiced. Peripheral IV site clean and dry with good blood return noted before and after infusion. Band aid applied. VSS with discharge and left in satisfactory condition with no s/s of distress noted.

## 2022-05-21 NOTE — Patient Instructions (Signed)
[place]  Discharge Instructions: Thank you for choosing [facility] to provide your oncology and hematology care.  If you have a lab appointment with the [facility], please come in thru the Main Entrance and check in at the main information desk.  Wear comfortable clothing and clothing appropriate for easy access to any Portacath or PICC line.   We strive to give you quality time with your provider. You may need to reschedule your appointment if you arrive late (15 or more minutes).  Arriving late affects you and other patients whose appointments are after yours.  Also, if you miss three or more appointments without notifying the office, you may be dismissed from the clinic at the provider's discretion.      For prescription refill requests, have your pharmacy contact our office and allow 72 hours for refills to be completed.    Today you received the following 2 units of PRBCs, return as scheduled.    To help prevent nausea and vomiting after your treatment, we encourage you to take your nausea medication as directed.  BELOW ARE SYMPTOMS THAT SHOULD BE REPORTED IMMEDIATELY: *FEVER GREATER THAN 100.4 F (38 C) OR HIGHER *CHILLS OR SWEATING *NAUSEA AND VOMITING THAT IS NOT CONTROLLED WITH YOUR NAUSEA MEDICATION *UNUSUAL SHORTNESS OF BREATH *UNUSUAL BRUISING OR BLEEDING *URINARY PROBLEMS (pain or burning when urinating, or frequent urination) *BOWEL PROBLEMS (unusual diarrhea, constipation, pain near the anus) TENDERNESS IN MOUTH AND THROAT WITH OR WITHOUT PRESENCE OF ULCERS (sore throat, sores in mouth, or a toothache) UNUSUAL RASH, SWELLING OR PAIN  UNUSUAL VAGINAL DISCHARGE OR ITCHING   Items with * indicate a potential emergency and should be followed up as soon as possible or go to the Emergency Department if any problems should occur.  Please show the CHEMOTHERAPY ALERT CARD or IMMUNOTHERAPY ALERT CARD at check-in to the Emergency Department  and triage nurse.  Should you have questions after your visit or need to cancel or reschedule your appointment, please contact [facility] [phone]  and follow the prompts.  Office hours are 8:00 a.m. to 4:30 p.m. Monday - Friday. Please note that voicemails left after 4:00 p.m. may not be returned until the following business day.  We are closed weekends and major holidays. You have access to a nurse at all times for urgent questions. Please call the main number to the clinic [phone] and follow the prompts.  For any non-urgent questions, you may also contact your provider using MyChart. We now offer e-Visits for anyone 68 and older to request care online for non-urgent symptoms. For details visit [email or web address].   Also download the MyChart app! Go to the app store, search "MyChart", open the app, select [facility], and log in with your MyChart username and password.  Masks are optional in the cancer centers. If you would like for your care team to wear a mask while they are taking care of you, please let them know. You may have one support person who is at least 64 years old accompany you for your appointments.

## 2022-05-22 ENCOUNTER — Inpatient Hospital Stay: Payer: 59

## 2022-05-22 DIAGNOSIS — R188 Other ascites: Secondary | ICD-10-CM | POA: Diagnosis not present

## 2022-05-22 DIAGNOSIS — K643 Fourth degree hemorrhoids: Secondary | ICD-10-CM | POA: Diagnosis not present

## 2022-05-22 DIAGNOSIS — K7460 Unspecified cirrhosis of liver: Secondary | ICD-10-CM | POA: Diagnosis not present

## 2022-05-22 DIAGNOSIS — Z7409 Other reduced mobility: Secondary | ICD-10-CM | POA: Diagnosis not present

## 2022-05-22 DIAGNOSIS — K625 Hemorrhage of anus and rectum: Secondary | ICD-10-CM | POA: Diagnosis not present

## 2022-05-22 LAB — TYPE AND SCREEN
ABO/RH(D): A POS
Antibody Screen: NEGATIVE
Unit division: 0
Unit division: 0

## 2022-05-22 LAB — BPAM RBC
Blood Product Expiration Date: 202310072359
Blood Product Expiration Date: 202310282359
ISSUE DATE / TIME: 202310041253
ISSUE DATE / TIME: 202310041443
Unit Type and Rh: 600
Unit Type and Rh: 6200

## 2022-05-23 ENCOUNTER — Ambulatory Visit (HOSPITAL_COMMUNITY): Payer: 59 | Admitting: Physical Therapy

## 2022-05-23 DIAGNOSIS — R262 Difficulty in walking, not elsewhere classified: Secondary | ICD-10-CM | POA: Diagnosis not present

## 2022-05-23 DIAGNOSIS — K7460 Unspecified cirrhosis of liver: Secondary | ICD-10-CM | POA: Diagnosis not present

## 2022-05-23 DIAGNOSIS — M6281 Muscle weakness (generalized): Secondary | ICD-10-CM

## 2022-05-23 DIAGNOSIS — R2689 Other abnormalities of gait and mobility: Secondary | ICD-10-CM | POA: Diagnosis not present

## 2022-05-23 NOTE — Therapy (Signed)
OUTPATIENT PHYSICAL THERAPY TREATMENT  Patient Name: Rachel Huber MRN: [id number] DOB:May 07, 1956, 66 y.o., female Today's Date: 05/23/2022   PT End of Session - 05/23/22 1118     Visit Number 5    Number of Visits 18    Date for PT Re-Evaluation 06/26/22    Authorization Type [employer] (no VL)    Progress Note Due on Visit 10    PT Start Time 1035    PT Stop Time 1115    PT Time Calculation (min) 40 min    Activity Tolerance Patient tolerated treatment well    Behavior During Therapy WFL for tasks assessed/performed               Past Medical History:  Diagnosis Date   Anxiety    Arthritis    Cirrhosis of liver ([place])    Diabetes mellitus without complication ([place])    Dyspnea    DOE   GERD (gastroesophageal reflux disease)    Grade IV internal hemorrhoids    Contingency plan for any future admissions for severe anemia in the setting of persistent GI bleed:  nuclear medicine tagged RBC scan to assess for location of GI bleeding - if from rectum, would then proceed to angiogram with possible repeat embolization.  Please notify IR in this case.   Hepatitis    PAST   Hypertension    Neuropathy    Neuropathy, diabetic ([place])    Pneumonia    Sinus complaint    Sleep apnea    CPAP   Thalassemia minor 1992   Past Surgical History:  Procedure Laterality Date   ABDOMINAL HYSTERECTOMY     BREAST BIOPSY Right 02/15/2018   [place] bx 6-6:30 ribbon shape, ONE CORE FRAGMENT WITH FIBROSIS. ONE CORE FRAGMENT WITH PORTION OF A DILATED   BREAST BIOPSY Right 02/15/2018   [place] bx 9:00 heart shape, USUAL DUCTAL HYPERPLASIA   BREAST LUMPECTOMY Right 03/09/2018   Procedure: BREAST LUMPECTOMY x 2;  Surgeon: Benjamine Sprague, DO;  Location: [facility] ORS;  Service: General;  Laterality: Right;   CATARACT EXTRACTION W/PHACO Left 06/11/2016   Procedure: CATARACT EXTRACTION PHACO AND INTRAOCULAR LENS PLACEMENT (IOC);  Surgeon: Estill Cotta, MD;  Location: [facility];  Service: Ophthalmology;   Laterality: Left;  Lot # [id number] H US:01:38.6 AP%:26.4 CDE:44.15   CATARACT EXTRACTION W/PHACO Right 06/15/2018   Procedure: CATARACT EXTRACTION PHACO AND INTRAOCULAR LENS PLACEMENT (IOC);  Surgeon: Birder Robson, MD;  Location: [facility];  Service: Ophthalmology;  Laterality: Right;  [place] 00:38.2 CDE 4.23 Fluid Pack Lot # [id number]   COLONOSCOPIES     COLONOSCOPY WITH PROPOFOL N/A 10/10/2020   Procedure: COLONOSCOPY WITH PROPOFOL;  Surgeon: Lesly Rubenstein, MD;  Location[facility] ENDOSCOPY;  Service: Endoscopy;  Laterality: N/A;   COLONOSCOPY WITH PROPOFOL N/A 11/20/2020   Procedure: COLONOSCOPY WITH PROPOFOL;  Surgeon: Lesly Rubenstein, MD;  Location: [facility] ENDOSCOPY;  Service: Endoscopy;  Laterality: N/A;  DM STAT CBC, BMP COVID POSITIVE 09/02/2020   CTR     ESOPHAGOGASTRODUODENOSCOPY (EGD) WITH PROPOFOL N/A 10/10/2020   Procedure: ESOPHAGOGASTRODUODENOSCOPY (EGD) WITH PROPOFOL;  Surgeon: Lesly Rubenstein, MD;  Location: [facility] ENDOSCOPY;  Service: Endoscopy;  Laterality: N/A;  COVID POSITIVE 10/08/2020   FLEXIBLE SIGMOIDOSCOPY N/A 02/06/2022   Procedure: FLEXIBLE SIGMOIDOSCOPY;  Surgeon: Lesly Rubenstein, MD;  Location: [facility] ENDOSCOPY;  Service: Endoscopy;  Laterality: N/A;  Patient requests anesthesia   IR ANGIOGRAM SELECTIVE EACH ADDITIONAL VESSEL  02/07/2022   IR ANGIOGRAM SELECTIVE EACH ADDITIONAL VESSEL  02/07/2022   IR ANGIOGRAM VISCERAL SELECTIVE  02/07/2022   IR EMBO ARTERIAL NOT HEMORR HEMANG INC GUIDE ROADMAPPING  02/07/2022   IR PARACENTESIS  12/17/2021   IR PARACENTESIS  03/25/2022   IR RADIOLOGIST EVAL & MGMT  03/18/2022   IR RADIOLOGIST EVAL & MGMT  05/07/2022   IR RADIOLOGIST EVAL & MGMT  05/16/2022   IR US GUIDE VASC ACCESS RIGHT  02/07/2022   JOINT REPLACEMENT     KNEE SURGERY Right 09/24/2017   plates and pins   TOTAL SHOULDER ARTHROPLASTY Right 09/27/2015   Patient Active Problem List   Diagnosis Date Noted   Grade IV internal hemorrhoids 05/16/2022   Acute blood loss  anemia 03/20/2022   Acute on chronic blood loss anemia 03/05/2022   Thrombocytopenia ([place]) 03/05/2022   Fever 02/08/2022   Pancytopenia ([place]) 02/07/2022   GI bleeding 02/04/2022   Chronic kidney disease, stage 3b ([place]) 02/04/2022   Depression with anxiety 02/04/2022   Liver cirrhosis secondary to NASH ([place])    Acute GI hemorrhage 01/28/2022   AKI (acute kidney injury) ([place]) 01/27/2022   Insomnia 01/27/2022   Iron deficiency anemia due to chronic blood loss 02/13/2021   History of uterine cancer 02/13/2021   Decompensated hepatic cirrhosis ([place]) 10/25/2020   OSA on CPAP 10/25/2020   Glossitis 10/25/2020   Hyponatremia 10/08/2020   Acute respiratory failure with hypoxia ([place]) 09/10/2020   Pneumonia due to COVID-19 virus 09/08/2020   Hip fracture, right ([place]) 09/04/2020   Closed right hip fracture ([place]) 09/02/2020   Type 2 diabetes mellitus with hyperlipidemia ([place]) 09/02/2020   Thalassemia minor    HLD (hyperlipidemia)    Acute kidney injury superimposed on CKD ([place])    Depression    Fall at home, initial encounter    Nondisplaced fracture of greater trochanter of right femur, initial encounter for closed fracture ([place])     PCP: Frazier Richards MD  REFERRING PROVIDER: Tinnie Gens, NP   REFERRING DIAG: PT eval/tx for strength and conditioning   THERAPY DIAG:  Muscle weakness (generalized)  Other abnormalities of gait and mobility  Difficulty in walking, not elsewhere classified  Cirrhosis, non-alcoholic ([place])  Rationale for Evaluation and Treatment Rehabilitation  ONSET DATE: Chronic  SUBJECTIVE:   SUBJECTIVE STATEMENT: Pt states that her shoulder is aching a little but not bad.  Pt states that walking to the bathroom is easier for her now.  PERTINENT HISTORY: Falls, weakness,DM, R shoulder reverse replacement anFeb 2019 R knee pain ([place])   PAIN:  Are you having pain? Yes: NPRS scale: 2/10 Pain location: shoulder  Pain description: aching   Aggravating factors: use, movement, WB Relieving factors: rest, meds, non WB  PRECAUTIONS: Fall  WEIGHT BEARING RESTRICTIONS No  FALLS:  Has patient fallen in last 6 months? Yes. Number of falls 1  LIVING ENVIRONMENT: Lives with: lives with their family and lives with their spouse Lives in: House/apartment Stairs: No Has following equipment at home: Single point cane and Environmental consultant - 2 wheeled  OCCUPATION: Retired/ Disability   PLOF: Needs assistance with ADLs  PATIENT GOALS  Get to point of not having to use cane/ improve balance    OBJECTIVE:   DIAGNOSTIC FINDINGS: NA   COGNITION:  Overall cognitive status: Within functional limits for tasks assessed     SENSATION: WFL   LOWER EXTREMITY MMT:  MMT Right eval Left eval  Hip flexion 4 4  Hip extension    Hip abduction    Hip adduction  Hip internal rotation    Hip external rotation    Knee flexion 4 4  Knee extension 4 4  Ankle dorsiflexion 4 4  Ankle plantarflexion    Ankle inversion    Ankle eversion     (Blank rows = not tested)  FUNCTIONAL TESTS:  5 times sit to stand: 28.5 second with use of hands  2 minute walk test: 170 feet  GAIT: Distance walked: 170 feet Assistive device utilized: Walker - 2 wheeled Level of assistance: Modified independence and SBA Comments: flexed trunk, decreased stride  TODAY'S TREATMENT:            05/23/22            Nustep hills 3 x 6' level 3            Step up 6" x 10 B            Marching x 15             Minisquat x 10             Side step at // with green theraband  x 3 RT            Sit to stand x 10 with green theraband      05/19/22                        Standing:  Heel raises  20X                         Toeraises 20X Marching 10X 2 sets 1 HHA with intermittent 2HHA                        Mini squat 10X 2 sets                         Side step at // x 2 RT    Sit to stand x10 no UE's from standard chair, no UE    Ambulation around clinic with RW  for activity tolerance X150 feet without rest                     05/16/22                        Standing:                        Heel raises x 10                        Marching x 10                         Mini squat x 10                         Side step at // x 2 RT                         Sit to stand x10  05/14/22  Goal review, discussion of symtoms and medical issues  Seated:  Marching 2X10   LAQ 2X10 with 5" holds   Sit to stands 10X from 21" surface  Supine:  SLR 10X each LE  Bridge 10X    Eval  Functional testing HEP development    PATIENT EDUCATION:  Education details: on eval findings, POC and HEP  Person educated: Patient Education method: Explanation Education comprehension: verbalized understanding   HOME EXERCISE PROGRAM:             04/19/22 - Heel Raises with Counter Support  - 2 x daily - 7 x weekly - 1 sets - 10 reps - 3-5" hold - Mini Squat with Counter Support  - 2 x daily - 7 x weekly - 1 sets - 10 reps - 3-5" hold - Sit to Stand with Counter Support  - 2 x daily - 7 x weekly - 1 sets - 10 reps9/              Access Code: [email or web address] URL: [email or web address] Date: 05/01/2022 Prepared by: Josue Hector  Exercises - Seated Heel Toe Raises  - 2 x daily - 7 x weekly - 1 sets - 10 reps - Seated Long Arc Quad  - 2 x daily - 7 x weekly - 1 sets - 10 reps - Seated March  - 2 x daily - 7 x weekly - 1 sets - 10 reps - Supine Active Straight Leg Raise  - 7 x weekly - 1 sets - 10 reps  ASSESSMENT:  CLINICAL IMPRESSION: Therapist added resistive band to sit to stand and side stepping.  Added step up to strengthening exercises.   . PT continues to have decreased strength, decreased mobility  and  decreased activity tolerance  Patient will benefit from skilled physical therapy services to address these deficits to reduce pain and improve level of function with ADLs and functional mobility tasks.    OBJECTIVE IMPAIRMENTS Abnormal gait,  decreased activity tolerance, decreased balance, decreased mobility, difficulty walking, decreased ROM, decreased strength, improper body mechanics, and pain.   ACTIVITY LIMITATIONS lifting, bending, standing, squatting, stairs, transfers, and locomotion level  PARTICIPATION LIMITATIONS: meal prep, cleaning, laundry, shopping, community activity, and yard work  PERSONAL FACTORS Past/current experiences and Time since onset of injury/illness/exacerbation are also affecting patient's functional outcome.   REHAB POTENTIAL: Good  CLINICAL DECISION MAKING: Stable/uncomplicated  EVALUATION COMPLEXITY: Low   GOALS: SHORT TERM GOALS: Target date: 05/29/2022  Patient will be independent with initial HEP and self-management strategies to improve functional outcomes Baseline:  Goal status: IN PROGRESS  2. Patient will be able to stand, with minimal support, at least 5 minutes for improved ability to perform meal preparation/ cooking/ grooming/ cleaning ADLs.  Baseline: 2.5 minutes Goal status: IN PROGRESS  LONG TERM GOALS: Target date: 06/26/2022  Patient will be independent with advanced HEP and self-management strategies to improve functional outcomes Baseline:  Goal status: IN PROGRESS  2.  Patient will report at least 65% overall improvement in subjective complaint to indicate improvement in ability to perform ADLs. Baseline:  Goal status: IN PROGRESS  3.  Patient will be able to stand, with minimal support, at least 10 minutes for improved ability to perform meal preparation/ cooking/ grooming/ cleaning ADLs.  Baseline: 2.5 minutes Goal status: IN PROGRESS  4. Patient will have equal to or > 4+/5 MMT throughout BLE to improve ability to perform functional mobility, stair ambulation and ADLs.  Baseline: See MMT Goal status: IN PROGRESS  5. Patient will be able to ambulate at least 300 feet during 2MWT with LRAD to demonstrate improved ability to perform functional mobility and  associated tasks. Baseline: 170 feet Goal status: IN PROGRESS  PLAN: PT  FREQUENCY: 2x/week  PT DURATION: 8 weeks  PLANNED INTERVENTIONS: Therapeutic exercises, Therapeutic activity, Neuromuscular re-education, Balance training, Gait training, Patient/Family education, Joint manipulation, Joint mobilization, Stair training, Aquatic Therapy, Dry Needling, Electrical stimulation, Spinal manipulation, Spinal mobilization, Cryotherapy, Moist heat, scar mobilization, Taping, Traction, Ultrasound, Biofeedback, Ionotophoresis 87m/ml Dexamethasone, and Manual therapy.   PLAN FOR NEXT SESSION: Progress functional LE strength, work on ambulation tolerance.   CRayetta Humphrey PT CLT [phone] 05/23/2022, 1127 AM

## 2022-05-26 ENCOUNTER — Ambulatory Visit (HOSPITAL_COMMUNITY): Payer: 59 | Admitting: Physical Therapy

## 2022-05-26 ENCOUNTER — Other Ambulatory Visit: Payer: Self-pay | Admitting: Gastroenterology

## 2022-05-26 ENCOUNTER — Encounter (HOSPITAL_COMMUNITY): Payer: Self-pay | Admitting: Physical Therapy

## 2022-05-26 DIAGNOSIS — K7460 Unspecified cirrhosis of liver: Secondary | ICD-10-CM | POA: Diagnosis not present

## 2022-05-26 DIAGNOSIS — M6281 Muscle weakness (generalized): Secondary | ICD-10-CM | POA: Diagnosis not present

## 2022-05-26 DIAGNOSIS — R262 Difficulty in walking, not elsewhere classified: Secondary | ICD-10-CM

## 2022-05-26 DIAGNOSIS — R2689 Other abnormalities of gait and mobility: Secondary | ICD-10-CM

## 2022-05-26 NOTE — Therapy (Signed)
OUTPATIENT PHYSICAL THERAPY TREATMENT  Patient Name: Rachel Huber MRN: [id number] DOB:[birth year], 66 y.o., female Today's Date: 05/26/2022   PT End of Session - 05/26/22 1621     Visit Number 6    Number of Visits 18    Date for PT Re-Evaluation 06/26/22    Authorization Type [employer] (no VL)    Progress Note Due on Visit 10    PT Start Time 1620    PT Stop Time 1643    PT Time Calculation (min) 23 min    Activity Tolerance Patient tolerated treatment well    Behavior During Therapy WFL for tasks assessed/performed               Past Medical History:  Diagnosis Date   Anxiety    Arthritis    Cirrhosis of liver ([place])    Diabetes mellitus without complication ([place])    Dyspnea    DOE   GERD (gastroesophageal reflux disease)    Grade IV internal hemorrhoids    Contingency plan for any future admissions for severe anemia in the setting of persistent GI bleed:  nuclear medicine tagged RBC scan to assess for location of GI bleeding - if from rectum, would then proceed to angiogram with possible repeat embolization.  Please notify IR in this case.   Hepatitis    PAST   Hypertension    Neuropathy    Neuropathy, diabetic ([place])    Pneumonia    Sinus complaint    Sleep apnea    CPAP   Thalassemia minor 1992   Past Surgical History:  Procedure Laterality Date   ABDOMINAL HYSTERECTOMY     BREAST BIOPSY Right 02/15/2018   [place] bx 6-6:30 ribbon shape, ONE CORE FRAGMENT WITH FIBROSIS. ONE CORE FRAGMENT WITH PORTION OF A DILATED   BREAST BIOPSY Right 02/15/2018   [place] bx 9:00 heart shape, USUAL DUCTAL HYPERPLASIA   BREAST LUMPECTOMY Right 03/09/2018   Procedure: BREAST LUMPECTOMY x 2;  Surgeon: Benjamine Sprague, DO;  Location: [facility] OR[facility];  Service: General;  Laterality: Right;   CATARACT EXTRACTION W/PHACO Left 06/11/2016   Procedure: CATARACT EXTRACTION PHACO AND INTRAOCULAR LENS PLACEMENT (IOC);  Surgeon: Estill Cotta, MD;  Location: [facility];  Service: Ophthalmology;   Laterality: Left;  Lot # [id number] H US:01:38.6 AP%:26.4 CDE:44.15   CATARACT EXTRACTION W/PHACO Right 06/15/2018   Procedure: CATARACT EXTRACTION PHACO AND INTRAOCULAR LENS PLACEMENT (IOC);  Surgeon: Birder Robson, MD;  Location: [facility];  Service: Ophthalmology;  Laterality: Right;  [place] 00:38.2 CDE 4.23 Fluid Pack Lot # [id number]   COLONOSCOPIES     COLONOSCOPY WITH PROPOFOL N/A 10/10/2020   Procedure: COLONOSCOPY WITH PROPOFOL;  Surgeon: Lesly Rubenstein, MD;  Location: [facility] ENDOSCOPY;  Service: Endoscopy;  Laterality: N/A;   COLONOSCOPY WITH PROPOFOL N/A 11/20/2020   Procedure: COLONOSCOPY WITH PROPOFOL;  Surgeon: Lesly Rubenstein, MD;  Location: [facility] ENDOSCOPY;  Service: Endoscopy;  Laterality: N/A;  DM STAT CBC, BMP COVID POSITIVE 09/02/2020   CTR     ESOPHAGOGASTRODUODENOSCOPY (EGD) WITH PROPOFOL N/A 10/10/2020   Procedure: ESOPHAGOGASTRODUODENOSCOPY (EGD) WITH PROPOFOL;  Surgeon: Lesly Rubenstein, MD;  Location: [facility] ENDOSCOPY;  Service: Endoscopy;  Laterality: N/A;  COVID POSITIVE 10/08/2020   FLEXIBLE SIGMOIDOSCOPY N/A 02/06/2022   Procedure: FLEXIBLE SIGMOIDOSCOPY;  Surgeon: Lesly Rubenstein, MD;  Location: [facility] ENDOSCOPY;  Service: Endoscopy;  Laterality: N/A;  Patient requests anesthesia   IR ANGIOGRAM SELECTIVE EACH ADDITIONAL VESSEL  02/07/2022   IR ANGIOGRAM SELECTIVE EACH ADDITIONAL VESSEL  02/07/2022   IR ANGIOGRAM VISCERAL SELECTIVE  02/07/2022   IR EMBO ARTERIAL NOT HEMORR HEMANG INC GUIDE ROADMAPPING  02/07/2022   IR PARACENTESIS  12/17/2021   IR PARACENTESIS  03/25/2022   IR RADIOLOGIST EVAL & MGMT  03/18/2022   IR RADIOLOGIST EVAL & MGMT  05/07/2022   IR RADIOLOGIST EVAL & MGMT  05/16/2022   IR US GUIDE VASC ACCESS RIGHT  02/07/2022   JOINT REPLACEMENT     KNEE SURGERY Right 09/24/2017   plates and pins   TOTAL SHOULDER ARTHROPLASTY Right 09/27/2015   Patient Active Problem List   Diagnosis Date Noted   Grade IV internal hemorrhoids 05/16/2022   Acute blood loss  anemia 03/20/2022   Acute on chronic blood loss anemia 03/05/2022   Thrombocytopenia ([place]) 03/05/2022   Fever 02/08/2022   Pancytopenia ([place]) 02/07/2022   GI bleeding 02/04/2022   Chronic kidney disease, stage 3b ([place]) 02/04/2022   Depression with anxiety 02/04/2022   Liver cirrhosis secondary to NASH ([place])    Acute GI hemorrhage 01/28/2022   AKI (acute kidney injury) ([place]) 01/27/2022   Insomnia 01/27/2022   Iron deficiency anemia due to chronic blood loss 02/13/2021   History of uterine cancer 02/13/2021   Decompensated hepatic cirrhosis ([place]) 10/25/2020   OSA on CPAP 10/25/2020   Glossitis 10/25/2020   Hyponatremia 10/08/2020   Acute respiratory failure with hypoxia ([place]) 09/10/2020   Pneumonia due to COVID-19 virus 09/08/2020   Hip fracture, right ([place]) 09/04/2020   Closed right hip fracture ([place]) 09/02/2020   Type 2 diabetes mellitus with hyperlipidemia ([place]) 09/02/2020   Thalassemia minor    HLD (hyperlipidemia)    Acute kidney injury superimposed on CKD ([place])    Depression    Fall at home, initial encounter    Nondisplaced fracture of greater trochanter of right femur, initial encounter for closed fracture ([place])     PCP: Frazier Richards MD  REFERRING PROVIDER: Tinnie Gens, NP   REFERRING DIAG: PT eval/tx for strength and conditioning   THERAPY DIAG:  Muscle weakness (generalized)  Other abnormalities of gait and mobility  Difficulty in walking, not elsewhere classified  Rationale for Evaluation and Treatment Rehabilitation  ONSET DATE: Chronic  SUBJECTIVE:   SUBJECTIVE STATEMENT: Has some swelling in legs more today. No pain currently.  PERTINENT HISTORY: Falls, weakness,DM, R shoulder reverse replacement anFeb 2019 R knee pain ([place])   PAIN:  Are you having pain? Yes: NPRS scale: 0/10 Pain location: shoulder  Pain description: aching  Aggravating factors: use, movement, WB Relieving factors: rest, meds, non WB  PRECAUTIONS:  Fall  WEIGHT BEARING RESTRICTIONS No  FALLS:  Has patient fallen in last 6 months? Yes. Number of falls 1  LIVING ENVIRONMENT: Lives with: lives with their family and lives with their spouse Lives in: House/apartment Stairs: No Has following equipment at home: Single point cane and Environmental consultant - 2 wheeled  OCCUPATION: Retired/ Disability   PLOF: Needs assistance with ADLs  PATIENT GOALS  Get to point of not having to use cane/ improve balance    OBJECTIVE:   DIAGNOSTIC FINDINGS: NA   COGNITION:  Overall cognitive status: Within functional limits for tasks assessed     SENSATION: WFL   LOWER EXTREMITY MMT:  MMT Right eval Left eval  Hip flexion 4 4  Hip extension    Hip abduction    Hip adduction    Hip internal rotation    Hip external rotation    Knee flexion 4 4  Knee  extension 4 4  Ankle dorsiflexion 4 4  Ankle plantarflexion    Ankle inversion    Ankle eversion     (Blank rows = not tested)  FUNCTIONAL TESTS:  5 times sit to stand: 28.5 second with use of hands  2 minute walk test: 170 feet  GAIT: Distance walked: 170 feet Assistive device utilized: Walker - 2 wheeled Level of assistance: Modified independence and SBA Comments: flexed trunk, decreased stride  TODAY'S TREATMENT:            05/26/22 Nustep (seat 12) lv 2 4 min dynamic warmup  Step up 6 inch x 10 HHA x 2 Clinic ambulation 170 feet with RW  Sit to stand x10   05/23/22            Nustep hills 3 x 6' level 3            Step up 6" x 10 B            Marching x 15             Minisquat x 10             Side step at // with green theraband  x 3 RT            Sit to stand x 10 with green theraband      05/19/22                        Standing:  Heel raises  20X                         Toeraises 20X Marching 10X 2 sets 1 HHA with intermittent 2HHA                        Mini squat 10X 2 sets                         Side step at // x 2 RT    Sit to stand x10 no UE's from standard  chair, no UE    Ambulation around clinic with RW for activity tolerance X150 feet without rest                     05/16/22                        Standing:                        Heel raises x 10                        Marching x 10                         Mini squat x 10                         Side step at // x 2 RT                         Sit to stand x10   PATIENT EDUCATION:  Education details: on eval findings, POC and HEP  Person educated: Patient Education method: Explanation Education comprehension: verbalized understanding  HOME EXERCISE PROGRAM:             04/19/22 - Heel Raises with Counter Support  - 2 x daily - 7 x weekly - 1 sets - 10 reps - 3-5" hold - Mini Squat with Counter Support  - 2 x daily - 7 x weekly - 1 sets - 10 reps - 3-5" hold - Sit to Stand with Counter Support  - 2 x daily - 7 x weekly - 1 sets - 10 reps9/              Access Code: [email or web address] URL: [email or web address] Date: 05/01/2022 Prepared by: Josue Hector  Exercises - Seated Heel Toe Raises  - 2 x daily - 7 x weekly - 1 sets - 10 reps - Seated Long Arc Quad  - 2 x daily - 7 x weekly - 1 sets - 10 reps - Seated March  - 2 x daily - 7 x weekly - 1 sets - 10 reps - Supine Active Straight Leg Raise  - 7 x weekly - 1 sets - 10 reps  ASSESSMENT:  CLINICAL IMPRESSION: Limited session due to late arrival. Continued with functional LE strengthening and improving activity tolerance. Patient continues to be limited by fatigue and does require several breaks for rest. Was able to walk a little farther today. Patient will continue to benefit from skilled therapy services to reduce remaining deficits and improve functional ability.    OBJECTIVE IMPAIRMENTS Abnormal gait, decreased activity tolerance, decreased balance, decreased mobility, difficulty walking, decreased ROM, decreased strength, improper body mechanics, and pain.   ACTIVITY LIMITATIONS lifting, bending, standing,  squatting, stairs, transfers, and locomotion level  PARTICIPATION LIMITATIONS: meal prep, cleaning, laundry, shopping, community activity, and yard work  PERSONAL FACTORS Past/current experiences and Time since onset of injury/illness/exacerbation are also affecting patient's functional outcome.   REHAB POTENTIAL: Good  CLINICAL DECISION MAKING: Stable/uncomplicated  EVALUATION COMPLEXITY: Low   GOALS: SHORT TERM GOALS: Target date: 05/29/2022  Patient will be independent with initial HEP and self-management strategies to improve functional outcomes Baseline:  Goal status: IN PROGRESS  2. Patient will be able to stand, with minimal support, at least 5 minutes for improved ability to perform meal preparation/ cooking/ grooming/ cleaning ADLs.  Baseline: 2.5 minutes Goal status: IN PROGRESS  LONG TERM GOALS: Target date: 06/26/2022  Patient will be independent with advanced HEP and self-management strategies to improve functional outcomes Baseline:  Goal status: IN PROGRESS  2.  Patient will report at least 65% overall improvement in subjective complaint to indicate improvement in ability to perform ADLs. Baseline:  Goal status: IN PROGRESS  3.  Patient will be able to stand, with minimal support, at least 10 minutes for improved ability to perform meal preparation/ cooking/ grooming/ cleaning ADLs.  Baseline: 2.5 minutes Goal status: IN PROGRESS  4. Patient will have equal to or > 4+/5 MMT throughout BLE to improve ability to perform functional mobility, stair ambulation and ADLs.  Baseline: See MMT Goal status: IN PROGRESS  5. Patient will be able to ambulate at least 300 feet during 2MWT with LRAD to demonstrate improved ability to perform functional mobility and associated tasks. Baseline: 170 feet Goal status: IN PROGRESS  PLAN: PT FREQUENCY: 2x/week  PT DURATION: 8 weeks  PLANNED INTERVENTIONS: Therapeutic exercises, Therapeutic activity, Neuromuscular  re-education, Balance training, Gait training, Patient/Family education, Joint manipulation, Joint mobilization, Stair training, Aquatic Therapy, Dry Needling, Electrical stimulation, Spinal manipulation, Spinal mobilization, Cryotherapy, Moist heat, scar mobilization, Taping, Traction, Ultrasound,  Biofeedback, Ionotophoresis 78m/ml Dexamethasone, and Manual therapy.   PLAN FOR NEXT SESSION: Progress functional LE strength, work on ambulation tolerance.  4:25 PM, 05/26/22 CJosue HectorPT DPT  Physical Therapist with [facility] [phone]

## 2022-05-27 ENCOUNTER — Ambulatory Visit
Admission: RE | Admit: 2022-05-27 | Discharge: 2022-05-27 | Disposition: A | Discharge status: Home / Self Care | Payer: 59 | Source: Ambulatory Visit | Attending: Gastroenterology | Admitting: Gastroenterology

## 2022-05-27 DIAGNOSIS — R188 Other ascites: Secondary | ICD-10-CM | POA: Diagnosis not present

## 2022-05-27 DIAGNOSIS — K7469 Other cirrhosis of liver: Secondary | ICD-10-CM | POA: Diagnosis not present

## 2022-05-27 DIAGNOSIS — K7581 Nonalcoholic steatohepatitis (NASH): Secondary | ICD-10-CM | POA: Insufficient documentation

## 2022-05-27 DIAGNOSIS — K7460 Unspecified cirrhosis of liver: Secondary | ICD-10-CM | POA: Insufficient documentation

## 2022-05-27 NOTE — Procedures (Signed)
PROCEDURE SUMMARY:  Successful image-guided paracentesis from the right lower abdomen.  Yielded 4.2 liters of yellow fluid.  No immediate complications.  EBL = trace. Patient tolerated well.   Specimen was not sent for labs.  Please see imaging section of Epic for full dictation.   Lura Em PA-C 05/27/2022 11:39 AM

## 2022-05-27 NOTE — Progress Notes (Signed)
Rachel Huber, [place] [address]   CLINIC:  Medical Oncology/Hematology  PCP:  [name], MD [place] [address] [phone]   REASON FOR VISIT:  Follow-up for iron deficiency anemia and thalassemia   PRIOR THERAPY: Blood transfusions as needed   CURRENT THERAPY: IV iron infusions as needed  INTERVAL HISTORY:  Rachel Huber 66 y.o. 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  She was last seen by Tarri Abernethy PA-C on 04/02/2022.  At today's visit, she reports feeling fair.  Since her last visit, she has not had any hospitalizations or surgeries, although she has required frequent paracentesis and blood transfusions (8 units PRBC since her last visit, all given within the past month).    For the past month, she has been having daily rectal bleeding most days of the week.  She has not had any rectal bleeding for the past 1 week.  She denies any melena, epistaxis, hematemesis, or hematuria.  She had an office visit with GI (Dr. Haig Prophet) on 10/5/202 and is scheduled for colonoscopy on 06/20/2022.    She reports moderately good energy, but is having difficulty getting around due to increased leg swelling which she thinks might be related to her blood transfusions (unable to take diuretics due to kidney function). Her headaches are stable at baseline. She denies any pica or restless legs. No chest pain.  She does have some orthopnea and intermittent dyspnea on exertion and shortness of breath when she has increased fluid buildup and ascites that is pressing on her diaphragm.  She feels lightheaded sometimes after paracentesis, but denies any syncopal episodes.  She is taking vitamin D3 and vitamin B12 supplements.  She continues to receive regular paracentesis for her decompensated liver  cirrhosis with ascites.     She has 100% energy and 100% appetite. She endorses that she is maintaining a stable weight.   REVIEW OF SYSTEMS:    Review of Systems  Constitutional:  Positive for fatigue. Negative for appetite change, chills, diaphoresis, fever and unexpected weight change.  HENT:   Negative for lump/mass and nosebleeds.   Eyes:  Negative for eye problems.  Respiratory:  Positive for cough and shortness of breath. Negative for hemoptysis.   Cardiovascular:  Positive for leg swelling. Negative for chest pain and palpitations.  Gastrointestinal:  Positive for abdominal distention, blood in stool and constipation. Negative for abdominal pain, diarrhea, nausea and vomiting.  Genitourinary:  Negative for hematuria.   Musculoskeletal:  Positive for arthralgias (right shoulder).  Skin: Negative.   Neurological:  Positive for dizziness, headaches and light-headedness.  Hematological:  Does not bruise/bleed easily.     PAST MEDICAL/SURGICAL HISTORY:  Past Medical History:  Diagnosis Date   Anxiety    Arthritis    Cirrhosis of liver ([place])    Diabetes mellitus without complication (HCC)    Dyspnea    DOE   GERD (gastroesophageal reflux disease)    Grade IV internal hemorrhoids    Contingency plan for any future admissions for severe anemia in the setting of persistent GI bleed:  nuclear medicine tagged RBC scan to assess for location of GI bleeding - if from rectum, would then proceed to angiogram with possible repeat embolization.  Please notify IR in this case.   Hepatitis    PAST   Hypertension  Neuropathy    Neuropathy, diabetic (HCC)    Pneumonia    Sinus complaint    Sleep apnea    CPAP   Thalassemia minor 1992   Past Surgical History:  Procedure Laterality Date   ABDOMINAL HYSTERECTOMY     BREAST BIOPSY Right 02/15/2018   [place] bx 6-6:30 ribbon shape, ONE CORE FRAGMENT WITH FIBROSIS. ONE CORE FRAGMENT WITH PORTION OF A DILATED   BREAST BIOPSY Right 02/15/2018    [place] bx 9:00 heart shape, USUAL DUCTAL HYPERPLASIA   BREAST LUMPECTOMY Right 03/09/2018   Procedure: BREAST LUMPECTOMY x 2;  Surgeon: Benjamine Sprague, DO;  Location: [facility];  Service: General;  Laterality: Right;   CATARACT EXTRACTION W/PHACO Left 06/11/2016   Procedure: CATARACT EXTRACTION PHACO AND INTRAOCULAR LENS PLACEMENT (IOC);  Surgeon: Estill Cotta, MD;  Location: [facility];  Service: Ophthalmology;  Laterality: Left;  Lot # [id number] H US:01:38.6 AP%:26.4 CDE:44.15   CATARACT EXTRACTION W/PHACO Right 06/15/2018   Procedure: CATARACT EXTRACTION PHACO AND INTRAOCULAR LENS PLACEMENT (IOC);  Surgeon: Birder Robson, MD;  Location: [facility];  Service: Ophthalmology;  Laterality: Right;  [place] 00:38.2 CDE 4.23 Fluid Pack Lot # [id number]   COLONOSCOPIES     COLONOSCOPY WITH PROPOFOL N/A 10/10/2020   Procedure: COLONOSCOPY WITH PROPOFOL;  Surgeon: Lesly Rubenstein, MD;  Location: [facility];  Service: Endoscopy;  Laterality: N/A;   COLONOSCOPY WITH PROPOFOL N/A 11/20/2020   Procedure: COLONOSCOPY WITH PROPOFOL;  Surgeon: Lesly Rubenstein, MD;  Location: [facility];  Service: Endoscopy;  Laterality: N/A;  DM STAT CBC, BMP COVID POSITIVE 09/02/2020   CTR     ESOPHAGOGASTRODUODENOSCOPY (EGD) WITH PROPOFOL N/A 10/10/2020   Procedure: ESOPHAGOGASTRODUODENOSCOPY (EGD) WITH PROPOFOL;  Surgeon: Lesly Rubenstein, MD;  Location: [facility];  Service: Endoscopy;  Laterality: N/A;  COVID POSITIVE 10/08/2020   FLEXIBLE SIGMOIDOSCOPY N/A 02/06/2022   Procedure: FLEXIBLE SIGMOIDOSCOPY;  Surgeon: Lesly Rubenstein, MD;  Location: [facility] ENDOS[facility];  Service: Endoscopy;  Laterality: N/A;  Patient requests anesthesia   IR ANGIOGRAM SELECTIVE EACH ADDITIONAL VESSEL  02/07/2022   IR ANGIOGRAM SELECTIVE EACH ADDITIONAL VESSEL  02/07/2022   IR ANGIOGRAM VISCERAL SELECTIVE  02/07/2022   IR EMBO ARTERIAL NOT HEMORR HEMANG INC GUIDE ROADMAPPING  02/07/2022   IR PARACENTESIS  12/17/2021   IR PARACENTESIS   03/25/2022   IR RADIOLOGIST EVAL & MGMT  03/18/2022   IR RADIOLOGIST EVAL & MGMT  05/07/2022   IR RADIOLOGIST EVAL & MGMT  05/16/2022   IR US GUIDE VASC ACCESS RIGHT  02/07/2022   JOINT REPLACEMENT     KNEE SURGERY Right 09/24/2017   plates and pins   TOTAL SHOULDER ARTHROPLASTY Right 09/27/2015    SOCIAL HISTORY:  Social History   Socioeconomic History   Marital status: Married    Spouse name: Not on file   Number of children: 0   Years of education: Not on file   Highest education level: Not on file  Occupational History   Occupation: EMPLOYED  Tobacco Use   Smoking status: Never   Smokeless tobacco: Never  Vaping Use   Vaping Use: Never used  Substance and Sexual Activity   Alcohol use: No   Drug use: Never   Sexual activity: Not Currently  Other Topics Concern   Not on file  Social History Narrative   Not on file   Social Determinants of Health   Financial Resource Strain: Low Risk  (04/04/2022)   Overall Financial Resource Strain (CARDIA)    Difficulty of Paying Living  Expenses: Not hard at all  Food Insecurity: Unknown (04/04/2022)   Hunger Vital Sign    Worried About Running Out of Food in the Last Year: Not on file    Ran Out of Food in the Last Year: Never true  Transportation Needs: No Transportation Needs (04/04/2022)   PRAPARE - Hydrologist (Medical): No    Lack of Transportation (Non-Medical): No  Physical Activity: Inactive (04/04/2022)   Exercise Vital Sign    Days of Exercise per Week: 0 days    Minutes of Exercise per Session: 0 min  Stress: No Stress Concern Present (04/04/2022)   [place]    Feeling of Stress : Only a little  Social Connections: Moderately Isolated (04/04/2022)   Social Connection and Isolation Panel [NHANES]    Frequency of Communication with Friends and Family: More than three times a week    Frequency of Social Gatherings with Friends  and Family: Three times a week    Attends Religious Services: Never    Active Member of Clubs or Organizations: No    Attends Archivist Meetings: Never    Marital Status: Married  Human resources officer Violence: Not At Risk (04/04/2022)   Humiliation, Afraid, Rape, and Kick questionnaire    Fear of Current or Ex-Partner: No    Emotionally Abused: No    Physically Abused: No    Sexually Abused: No    FAMILY HISTORY:  Family History  Problem Relation Age of Onset   Breast cancer Mother 17   Lymphoma Mother    Diabetes Father    Kidney cancer Father    Heart disease Father    Diabetes Sister    Breast cancer Sister 68    CURRENT MEDICATIONS:  Outpatient Encounter Medications as of 05/28/2022  Medication Sig   acetaminophen (TYLENOL) 650 MG CR tablet Take 1,300 mg by mouth daily as needed for pain.   albuterol (VENTOLIN HFA) 108 (90 Base) MCG/ACT inhaler Inhale 2 puffs into the lungs every 6 (six) hours as needed for wheezing or shortness of breath.   atorvastatin (LIPITOR) 10 MG tablet Take 10 mg by mouth daily.   diphenhydrAMINE (BENADRYL) 25 MG tablet Take 25 mg by mouth 2 (two) times daily as needed for itching.   ergocalciferol (VITAMIN D2) 1.25 MG (50000 UT) capsule Take 1 capsule (50,000 Units total) by mouth once a week.   gabapentin (NEURONTIN) 400 MG capsule Take 800 mg by mouth 2 (two) times daily.   hydrocortisone (ANUSOL-HC) 2.5 % rectal cream Place rectally 4 (four) times daily.   lactulose, encephalopathy, (CHRONULAC) 10 GM/15ML SOLN Take 30 mLs (20 g total) by mouth daily.   melatonin 5 MG TABS Take 10 mg by mouth at bedtime as needed (sleep).   Multiple Vitamins-Minerals (MULTIVITAMIN WITH MINERALS) tablet Take 1 tablet by mouth daily.   sertraline (ZOLOFT) 50 MG tablet Take 50 mg by mouth daily.   sodium chloride (OCEAN) 0.65 % SOLN nasal spray Place 1 spray into both nostrils as needed for congestion. (Patient taking differently: Place 1 spray into both  nostrils at bedtime.)   vitamin B-12 (CYANOCOBALAMIN) 1000 MCG tablet Take 1,000 mcg by mouth daily.   witch hazel-glycerin (TUCKS) pad Apply topically as needed for hemorrhoids.   No facility-administered encounter medications on file as of 05/28/2022.    ALLERGIES:  Allergies  Allergen Reactions   Gramineae Pollens Other (See Comments)    Sneezing, Running nose  Latex Rash    Contact rash     PHYSICAL EXAM:    ECOG PERFORMANCE STATUS: 2 - Symptomatic, <50% confined to bed  There were no vitals filed for this visit. There were no vitals filed for this visit. Physical Exam Constitutional:      Appearance: Normal appearance. She is obese.  HENT:     Head: Normocephalic and atraumatic.     Mouth/Throat:     Mouth: Mucous membranes are moist.  Eyes:     Extraocular Movements: Extraocular movements intact.     Pupils: Pupils are equal, round, and reactive to light.  Cardiovascular:     Rate and Rhythm: Normal rate and regular rhythm.     Pulses: Normal pulses.     Heart sounds: Normal heart sounds.  Pulmonary:     Effort: Pulmonary effort is normal.     Breath sounds: Rales (Faint bibasilar crackles) present.  Abdominal:     General: Bowel sounds are normal. There is distension.     Palpations: Abdomen is soft.     Tenderness: There is no abdominal tenderness.  Musculoskeletal:        General: No swelling.     Right lower leg: Edema (3+ pretibial edema, with pitting edema extending to hips) present.     Left lower leg: Edema (3+ pretibial edema, with pitting edema extending to hips) present.  Lymphadenopathy:     Cervical: No cervical adenopathy.  Skin:    General: Skin is warm and dry.  Neurological:     General: No focal deficit present.     Mental Status: She is alert and oriented to person, place, and time.  Psychiatric:        Mood and Affect: Mood normal.        Behavior: Behavior normal.    LABORATORY DATA:  I have reviewed the labs as listed.  CBC     Component Value Date/Time   WBC 3.4 (L) 05/21/2022 1007   RBC 2.61 (L) 05/21/2022 1007   HGB 6.5 (LL) 05/21/2022 1007   HCT 20.8 (L) 05/21/2022 1007   PLT 133 (L) 05/21/2022 1007   MCV 79.7 (L) 05/21/2022 1007   MCH 24.9 (L) 05/21/2022 1007   MCHC 31.3 05/21/2022 1007   RDW 21.4 (H) 05/21/2022 1007   LYMPHSABS 0.7 04/02/2022 1038   MONOABS 0.3 04/02/2022 1038   EOSABS 0.2 04/02/2022 1038   BASOSABS 0.0 04/02/2022 1038      Latest Ref Rng & Units 05/21/2022   10:07 AM 04/02/2022   10:35 AM 03/27/2022    4:01 AM  CMP  Glucose 70 - 99 mg/dL 155  134  143   BUN 8 - 23 mg/dL 38  38  28   Creatinine 0.44 - 1.00 mg/dL 1.68  1.49  1.20   Sodium 135 - 145 mmol/L 138  137  138   Potassium 3.5 - 5.1 mmol/L 4.5  4.2  3.8   Chloride 98 - 111 mmol/L 112  110  112   CO2 22 - 32 mmol/L 22  23  22    Calcium 8.9 - 10.3 mg/dL 8.3  8.1  8.1   Total Protein 6.5 - 8.1 g/dL 4.5  5.1    Total Bilirubin 0.3 - 1.2 mg/dL 2.2  1.3    Alkaline Phos 38 - 126 U/L 102  136    AST 15 - 41 U/L 38  46    ALT 0 - 44 U/L 22  27  DIAGNOSTIC IMAGING:  I have independently reviewed the relevant imaging and discussed with the patient.  ASSESSMENT & PLAN: 1.  Iron deficiency anemia - Suspected cause of anemia is due to chronic blood loss and malabsorption, as well as thalassemia (see below) - Colonoscopies in February 2022 & April 2022 showed grade III/IV nonbleeding internal hemorrhoids, polyps, and area of significantly congested mucosa - EGD in February 2022 & April 2022 showed portal hypertensive gastropathy and erythematous duodenopathy - She has had frequent hospitalizations and blood transfusions over the past year, with notable events recorded below:  Flexible sigmoidoscopy on 02/06/2022 showed prolapsed hemorrhoids and internal hemorrhoids.  Interventional radiology performed bilateral superior rectal artery particle and coil embolization on 02/07/2022. - There was some concern for possible hemolysis  due to findings of low haptoglobin, elevated reticulocytes (5.6%), and elevated bilirubin (total bili 3.0, direct bili 1.0, indirect bili 2.0).  Discussed extensively with Dr. Delton Coombes, who felt that she may have some underlying degree of hemolysis from her beta thalassemia intermedia, although low haptoglobin may also be from cirrhosis with high reticulocytes from acute blood loss anemia.   - During hospitalizations, patient has developed recurrent AKI's with evolving CKD - Most recent hematology panel (05/21/2022) with ferritin 283, iron saturation 64%, creatinine 1.68/GFR 34..  Previous labs showed normal copper, folate, and SPEP. - Intermittent rectal bleeding, which is at times severe  - Goal is to keep ferritin level around 200 (due to chronic GI blood loss and comorbid CKD); goal Hgb is 9.0-10.0 due to patient's underlying thalassemia.  Transfusion threshold is < 7.0. - PLAN: No IV iron at this time.   - Weekly CBC + BB sample with possible transfusion    - Repeat labs and RTC in 3 months   - Due to developing CKD, we would also consider ESA in the future if patient had persistent anemia despite adequate iron stores, taking into account the fact that her beta thalassemia predisposes her to chronic anemia.   2.  Vitamin B12 deficiency - Noted on 06/24/2021 to have normal vitamin B12 457, but with elevated methylmalonic acid 496 - Most recent labs (03/13/2022) with B12 750 and methylmalonic acid 540   - She is taking vitamin B12 supplement 500 mcg daily   - PLAN: Continue vitamin B12 supplementation with B12 cyanocobalamin 500 mcg daily  - We will recheck B12/methylmalonic acid at follow-up visit in about 3 months (February 2024)  3.  Beta thalassemia intermedia - She reports that she has always been anemic, but was not diagnosed with thalassemia until around age 32.  Her father, sister, and brother all have thalassemia. - Her hemoglobin has been as low as 4.6, and she has received  only 1  blood transfusion in the past related to her rectal bleeding.  She has not required regular transfusion for her thalassemia. 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  Discussed extensively with Dr. Delton Coombes, who felt that she may have some underlying degree of hemolysis from her beta thalassemia intermedia, although low haptoglobin may also be from cirrhosis with high reticulocytes from acute blood loss anemia.   - Her highest hemoglobin is around 9-10,  but her baseline hemoglobin usually runs from 7.0-8.0 - PLAN: We will transfuse as needed if < 7.0 or severely symptomatic from anemia.     4.  Vitamin D deficiency - Vitamin D deficiency noted at 19.97 (06/24/2021) - Patient was started on vitamin D 1000 units daily    - Most recent vitamin D (03/13/2022) remains low at 23.09 - PLAN: We will start her on vitamin D 50,000 units weekly.  We will recheck vitamin D in 3 months (February 2024)  5.  History of uterine cancer - Diagnosed in 2010, she has total hysterectomy and bilateral oophorectomy - She did not require any chemotherapy or radiation - She was treated in [place], [place] - Her last CT imaging of her abdomen and pelvis showed no evidence of cancer recurrence   6.  Decompensated non-alcoholic liver cirrhosis - Diagnosed in February 2022 - She has recurrent ascites from liver cirrhosis, requires intermittent paracentesis - EGD showed signs of portal hypertension - She is aware that she should avoid NSAID and aspirin containing patients  indefinitely - She follows with [facility] as well as [facility] - TIPS procedure on 12/26/2021 with revision on 03/12/2022.     All questions were answered. The patient knows to call the clinic with any problems, questions or concerns.  Medical decision making: Moderate  Time spent on visit: I spent 20 minutes counseling the patient face to face. The total time spent in the appointment was 30 minutes and more than 50% was on counseling.   Harriett Rush, PA-C  05/28/22 10:31 PM

## 2022-05-28 ENCOUNTER — Inpatient Hospital Stay (HOSPITAL_BASED_OUTPATIENT_CLINIC_OR_DEPARTMENT_OTHER): Payer: 59 | Admitting: Physician Assistant

## 2022-05-28 ENCOUNTER — Inpatient Hospital Stay: Payer: 59

## 2022-05-28 VITALS — BP 121/46 | HR 80 | Temp 97.7°F | Resp 18 | Ht 70.0 in | Wt 250.0 lb

## 2022-05-28 DIAGNOSIS — D500 Iron deficiency anemia secondary to blood loss (chronic): Secondary | ICD-10-CM

## 2022-05-28 DIAGNOSIS — D561 Beta thalassemia: Secondary | ICD-10-CM | POA: Diagnosis not present

## 2022-05-28 DIAGNOSIS — E538 Deficiency of other specified B group vitamins: Secondary | ICD-10-CM

## 2022-05-28 DIAGNOSIS — E559 Vitamin D deficiency, unspecified: Secondary | ICD-10-CM | POA: Diagnosis not present

## 2022-05-28 LAB — CBC
HCT: 22.7 % — ABNORMAL LOW (ref 36.0–46.0)
Hemoglobin: 7.1 g/dL — ABNORMAL LOW (ref 12.0–15.0)
MCH: 25.2 pg — ABNORMAL LOW (ref 26.0–34.0)
MCHC: 31.3 g/dL (ref 30.0–36.0)
MCV: 80.5 fL (ref 80.0–100.0)
Platelets: 144 10*3/uL — ABNORMAL LOW (ref 150–400)
RBC: 2.82 MIL/uL — ABNORMAL LOW (ref 3.87–5.11)
RDW: 20.6 % — ABNORMAL HIGH (ref 11.5–15.5)
WBC: 3.8 10*3/uL — ABNORMAL LOW (ref 4.0–10.5)
nRBC: 0.5 % — ABNORMAL HIGH (ref 0.0–0.2)

## 2022-05-28 LAB — SAMPLE TO BLOOD BANK

## 2022-05-28 NOTE — Patient Instructions (Signed)
[place] [place] **   You were seen today by Tarri Abernethy PA-C for your iron deficiency anemia.    IRON DEFICIENCY ANEMIA & THALASSEMIA: Your blood levels have improved, and you are within your baseline range of hemoglobin with your underlying thalassemia.  You do not need any IV iron at this time. In addition to losing blood from your rectal bleeding, your thalassemia can sometimes cause blood cells to break apart, which is called hemolysis. Due to your high risk of ongoing blood loss, we will schedule you for BLOOD CHECK once per week with blood transfusions as needed if your hemoglobin is less than 7.0.  VITAMIN B-12 DEFICIENCY Continue taking vitamin B12 500 mcg daily  VITAMIN D DEFICIENCY Continue vitamin D 50,000 units once per week.   FOLLOW-UP APPOINTMENT: Same-day labs and office visit in 3 months  ** Thank you for trusting me with your healthcare!  I strive to provide all of my patients with quality care at each visit.  If you receive a survey for this visit, I would be so grateful to you for taking the time to provide feedback.  Thank you in advance!  ~ Fionnuala Hemmerich                   Dr. Derek Jack   &   Tarri Abernethy, PA-C   - - - - - - - - - - - - - - - - - -    Thank you for choosing [place] at [facility] to provide your oncology and hematology care.  To afford each patient quality time with our provider, please arrive at least 15 minutes before your scheduled appointment time.   If you have a lab appointment with the [facility] please come in thru the Main Entrance and check in at the main information desk.  You need to re-schedule your appointment should you arrive 10 or more minutes late.  We strive to give you quality time with our providers, and arriving late affects you and other patients whose appointments are after yours.  Also, if you no show three  or more times for appointments you may be dismissed from the clinic at the providers discretion.     Again, thank you for choosing [facility].  Our hope is that these requests will decrease the amount of time that you wait before being seen by our physicians.       _____________________________________________________________  Should you have questions after your visit to [facility], please contact our office at [phone] and follow the prompts.  Our office hours are 8:00 a.m. and 4:30 p.m. Monday - Friday.  Please note that voicemails left after 4:00 p.m. may not be returned until the following business day.  We are closed weekends and major holidays.  You do have access to a nurse 24-7, just call the main number to the clinic [phone] and do not press any options, hold on the line and a nurse will answer the phone.    For prescription refill requests, have your pharmacy contact our office and allow 72 hours.

## 2022-05-29 ENCOUNTER — Inpatient Hospital Stay: Payer: 59

## 2022-05-30 ENCOUNTER — Encounter (HOSPITAL_COMMUNITY): Payer: Self-pay

## 2022-05-30 ENCOUNTER — Ambulatory Visit (HOSPITAL_COMMUNITY): Payer: 59

## 2022-05-30 DIAGNOSIS — K7460 Unspecified cirrhosis of liver: Secondary | ICD-10-CM | POA: Diagnosis not present

## 2022-05-30 DIAGNOSIS — R2689 Other abnormalities of gait and mobility: Secondary | ICD-10-CM

## 2022-05-30 DIAGNOSIS — R262 Difficulty in walking, not elsewhere classified: Secondary | ICD-10-CM

## 2022-05-30 DIAGNOSIS — M6281 Muscle weakness (generalized): Secondary | ICD-10-CM | POA: Diagnosis not present

## 2022-05-30 NOTE — Therapy (Signed)
OUTPATIENT PHYSICAL THERAPY TREATMENT  Patient Name: Rachel Huber MRN: [id number] DOB:05-12-1956, 66 y.o., female Today's Date: 05/30/2022   PT End of Session - 05/30/22 1604     Visit Number 7    Number of Visits 18    Date for PT Re-Evaluation 06/26/22    Authorization Type [employer] (no VL)    Authorization - Visit Number --    Authorization - Number of Visits --    Progress Note Due on Visit 10    PT Start Time 1530   late arrival   PT Stop Time 1604    PT Time Calculation (min) 34 min    Activity Tolerance Patient tolerated treatment well;Patient limited by pain;Patient limited by fatigue    Behavior During Therapy [facility] for tasks assessed/performed                Past Medical History:  Diagnosis Date   Anxiety    Arthritis    Cirrhosis of liver ([place])    Diabetes mellitus without complication (HCC)    Dyspnea    DOE   GERD (gastroesophageal reflux disease)    Grade IV internal hemorrhoids    Contingency plan for any future admissions for severe anemia in the setting of persistent GI bleed:  nuclear medicine tagged RBC scan to assess for location of GI bleeding - if from rectum, would then proceed to angiogram with possible repeat embolization.  Please notify IR in this case.   Hepatitis    PAST   Hypertension    Neuropathy    Neuropathy, diabetic ([place])    Pneumonia    Sinus complaint    Sleep apnea    CPAP   Thalassemia minor 1992   Past Surgical History:  Procedure Laterality Date   ABDOMINAL HYSTERECTOMY     BREAST BIOPSY Right 02/15/2018   [place] bx 6-6:30 ribbon shape, ONE CORE FRAGMENT WITH FIBROSIS. ONE CORE FRAGMENT WITH PORTION OF A DILATED   BREAST BIOPSY Right 02/15/2018   [place] bx 9:00 heart shape, USUAL DUCTAL HYPERPLASIA   BREAST LUMPECTOMY Right 03/09/2018   Procedure: BREAST LUMPECTOMY x 2;  Surgeon: Benjamine Sprague, DO;  Location: [facility] ORS;  Service: General;  Laterality: Right;   CATARACT EXTRACTION W/PHACO Left 06/11/2016    Procedure: CATARACT EXTRACTION PHACO AND INTRAOCULAR LENS PLACEMENT (IOC);  Surgeon: Estill Cotta, MD;  Location: [facility] ORS;  Service: Ophthalmology;  Laterality: Left;  Lot # [id number] H US:01:38.6 AP%:26.4 CDE:44.15   CATARACT EXTRACTION W/PHACO Right 06/15/2018   Procedure: CATARACT EXTRACTION PHACO AND INTRAOCULAR LENS PLACEMENT (IOC);  Surgeon: Birder Robson, MD;  Location: [facility] ORS;  Service: Ophthalmology;  Laterality: Right;  [place] 00:38.2 CDE 4.23 Fluid Pack Lot # [id number]   COLONOSCOPIES     COLONOSCOPY WITH PROPOFOL N/A 10/10/2020   Procedure: COLONOSCOPY WITH PROPOFOL;  Surgeon: Lesly Rubenstein, MD;  Location: [facility] ENDOSCOPY;  Service: Endoscopy;  Laterality: N/A;   COLONOSCOPY WITH PROPOFOL N/A 11/20/2020   Procedure: COLONOSCOPY WITH PROPOFOL;  Surgeon: Lesly Rubenstein, MD;  Location: [facility] ENDOSCOPY;  Service: Endoscopy;  Laterality: N/A;  DM STAT CBC, BMP COVID POSITIVE 09/02/2020   CTR     ESOPHAGOGASTRODUODENOSCOPY (EGD) WITH PROPOFOL N/A 10/10/2020   Procedure: ESOPHAGOGASTRODUODENOSCOPY (EGD) WITH PROPOFOL;  Surgeon: Lesly Rubenstein, MD;  Location: [facility] ENDOSCOPY;  Service: Endoscopy;  Laterality: N/A;  COVID POSITIVE 10/08/2020   FLEXIBLE SIGMOIDOSCOPY N/A 02/06/2022   Procedure: FLEXIBLE SIGMOIDOSCOPY;  Surgeon: Lesly Rubenstein, MD;  Location: [facility] ENDOSCOPY;  Service:  Endoscopy;  Laterality: N/A;  Patient requests anesthesia   IR ANGIOGRAM SELECTIVE EACH ADDITIONAL VESSEL  02/07/2022   IR ANGIOGRAM SELECTIVE EACH ADDITIONAL VESSEL  02/07/2022   IR ANGIOGRAM VISCERAL SELECTIVE  02/07/2022   IR EMBO ARTERIAL NOT HEMORR HEMANG INC GUIDE ROADMAPPING  02/07/2022   IR PARACENTESIS  12/17/2021   IR PARACENTESIS  03/25/2022   IR RADIOLOGIST EVAL & MGMT  03/18/2022   IR RADIOLOGIST EVAL & MGMT  05/07/2022   IR RADIOLOGIST EVAL & MGMT  05/16/2022   IR US GUIDE VASC ACCESS RIGHT  02/07/2022   JOINT REPLACEMENT     KNEE SURGERY Right 09/24/2017   plates and pins   TOTAL  SHOULDER ARTHROPLASTY Right 09/27/2015   Patient Active Problem List   Diagnosis Date Noted   Grade IV internal hemorrhoids 05/16/2022   Acute blood loss anemia 03/20/2022   Acute on chronic blood loss anemia 03/05/2022   Thrombocytopenia ([place]) 03/05/2022   Fever 02/08/2022   Pancytopenia ([place]) 02/07/2022   GI bleeding 02/04/2022   Chronic kidney disease, stage 3b ([place]) 02/04/2022   Depression with anxiety 02/04/2022   Liver cirrhosis secondary to NASH ([place])    Acute GI hemorrhage 01/28/2022   AKI (acute kidney injury) ([place]) 01/27/2022   Insomnia 01/27/2022   Iron deficiency anemia due to chronic blood loss 02/13/2021   History of uterine cancer 02/13/2021   Decompensated hepatic cirrhosis ([place]) 10/25/2020   OSA on CPAP 10/25/2020   Glossitis 10/25/2020   Hyponatremia 10/08/2020   Acute respiratory failure with hypoxia ([place]) 09/10/2020   Pneumonia due to COVID-19 virus 09/08/2020   Hip fracture, right ([place]) 09/04/2020   Closed right hip fracture ([place]) 09/02/2020   Type 2 diabetes mellitus with hyperlipidemia ([place]) 09/02/2020   Thalassemia minor    HLD (hyperlipidemia)    Acute kidney injury superimposed on CKD ([place])    Depression    Fall at home, initial encounter    Nondisplaced fracture of greater trochanter of right femur, initial encounter for closed fracture ([place])     PCP: Frazier Richards MD  REFERRING PROVIDER: Tinnie Gens, NP   REFERRING DIAG: PT eval/tx for strength and conditioning   THERAPY DIAG:  Muscle weakness (generalized)  Other abnormalities of gait and mobility  Difficulty in walking, not elsewhere classified  Rationale for Evaluation and Treatment Rehabilitation  ONSET DATE: Chronic  SUBJECTIVE:   SUBJECTIVE STATEMENT:  Pt stated her knees are bothering her today.  Reports a lot of fluid and stiffness in knees when weight bearing.  Reports she is aware of compression socks, reports very difficult to donn and requires her husbands  help to put on.    PERTINENT HISTORY: Falls, weakness,DM, R shoulder reverse replacement anFeb 2019 R knee pain ([place])   PAIN:  Are you having pain? Yes: NPRS scale: 5/10 Pain location: knees BLE Pain description: aching  Aggravating factors: use, movement, WB Relieving factors: rest, meds, non WB  PRECAUTIONS: Fall  WEIGHT BEARING RESTRICTIONS No  FALLS:  Has patient fallen in last 6 months? Yes. Number of falls 1  LIVING ENVIRONMENT: Lives with: lives with their family and lives with their spouse Lives in: House/apartment Stairs: No Has following equipment at home: Single point cane and Environmental consultant - 2 wheeled  OCCUPATION: Retired/ Disability   PLOF: Needs assistance with ADLs  PATIENT GOALS  Get to point of not having to use cane/ improve balance    OBJECTIVE:   DIAGNOSTIC FINDINGS: NA   COGNITION:  Overall cognitive status:  Within functional limits for tasks assessed     SENSATION: WFL   LOWER EXTREMITY MMT:  MMT Right eval Left eval  Hip flexion 4 4  Hip extension    Hip abduction    Hip adduction    Hip internal rotation    Hip external rotation    Knee flexion 4 4  Knee extension 4 4  Ankle dorsiflexion 4 4  Ankle plantarflexion    Ankle inversion    Ankle eversion     (Blank rows = not tested)  FUNCTIONAL TESTS:  5 times sit to stand: 28.5 second with use of hands  2 minute walk test: 170 feet  GAIT: Distance walked: 170 feet Assistive device utilized: Walker - 2 wheeled Level of assistance: Modified independence and SBA Comments: flexed trunk, decreased stride  TODAY'S TREATMENT: 05/30/22  2MWT 168f with RW  STS 10x elevated height for no HHA needed  Heel raises  Toe raises  Marching  Squat  Educated on benefits of compression garments and use of butler to assist with donning. Measurements taken and given handout for ETI.  05/26/22 Nustep (seat 12) lv 2 4 min dynamic warmup  Step up 6 inch x 10 HHA x 2 Clinic ambulation 170  feet with RW  Sit to stand x10   05/23/22            Nustep hills 3 x 6' level 3            Step up 6" x 10 B            Marching x 15             Minisquat x 10             Side step at // with green theraband  x 3 RT            Sit to stand x 10 with green theraband      05/19/22                        Standing:  Heel raises  20X                         Toeraises 20X Marching 10X 2 sets 1 HHA with intermittent 2HHA                        Mini squat 10X 2 sets                         Side step at // x 2 RT    Sit to stand x10 no UE's from standard chair, no UE    Ambulation around clinic with RW for activity tolerance X150 feet without rest                     05/16/22                        Standing:                        Heel raises x 10                        Marching x 10  Mini squat x 10                         Side step at // x 2 RT                         Sit to stand x10   PATIENT EDUCATION:  Education details: on eval findings, POC and HEP  Person educated: Patient Education method: Explanation Education comprehension: verbalized understanding   HOME EXERCISE PROGRAM:            05/30/22:  Begin walking program with RW  04/19/22 - Heel Raises with Counter Support  - 2 x daily - 7 x weekly - 1 sets - 10 reps - 3-5" hold - Mini Squat with Counter Support  - 2 x daily - 7 x weekly - 1 sets - 10 reps - 3-5" hold - Sit to Stand with Counter Support  - 2 x daily - 7 x weekly - 1 sets - 10 reps9/              Access Code: [email or web address] URL: [email or web address] Date: 05/01/2022 Prepared by: Josue Hector  Exercises - Seated Heel Toe Raises  - 2 x daily - 7 x weekly - 1 sets - 10 reps - Seated Long Arc Quad  - 2 x daily - 7 x weekly - 1 sets - 10 reps - Seated March  - 2 x daily - 7 x weekly - 1 sets - 10 reps - Supine Active Straight Leg Raise  - 7 x weekly - 1 sets - 10 reps  ASSESSMENT:  CLINICAL IMPRESSION:  Limited  session time due to late arrival.  Pt c/o increased swelling and increased pain with weight bearing.  Pt educated on benefits with compression garments and use of butler to assist with donning.  Measurements complete with noted induration on BLE distal extremities.  Continued session focus with functional LE strengthening and improving activity tolerance.  Began session with 2MWT, pt presents with decreased cadence compared to last progress note.  Encouraged to begin walking program to address impairement.  Pt did require several seated rest breaks through session due to fatigue and knee pain.  OBJECTIVE IMPAIRMENTS Abnormal gait, decreased activity tolerance, decreased balance, decreased mobility, difficulty walking, decreased ROM, decreased strength, improper body mechanics, and pain.   ACTIVITY LIMITATIONS lifting, bending, standing, squatting, stairs, transfers, and locomotion level  PARTICIPATION LIMITATIONS: meal prep, cleaning, laundry, shopping, community activity, and yard work  PERSONAL FACTORS Past/current experiences and Time since onset of injury/illness/exacerbation are also affecting patient's functional outcome.   REHAB POTENTIAL: Good  CLINICAL DECISION MAKING: Stable/uncomplicated  EVALUATION COMPLEXITY: Low   GOALS: SHORT TERM GOALS: Target date: 05/29/2022  Patient will be independent with initial HEP and self-management strategies to improve functional outcomes Baseline:  Goal status: IN PROGRESS  2. Patient will be able to stand, with minimal support, at least 5 minutes for improved ability to perform meal preparation/ cooking/ grooming/ cleaning ADLs.  Baseline: 2.5 minutes Goal status: IN PROGRESS  LONG TERM GOALS: Target date: 06/26/2022  Patient will be independent with advanced HEP and self-management strategies to improve functional outcomes Baseline:  Goal status: IN PROGRESS  2.  Patient will report at least 65% overall improvement in subjective complaint  to indicate improvement in ability to perform ADLs. Baseline:  Goal status: IN PROGRESS  3.  Patient will be able to stand, with minimal support, at  least 10 minutes for improved ability to perform meal preparation/ cooking/ grooming/ cleaning ADLs.  Baseline: 2.5 minutes Goal status: IN PROGRESS  4. Patient will have equal to or > 4+/5 MMT throughout BLE to improve ability to perform functional mobility, stair ambulation and ADLs.  Baseline: See MMT Goal status: IN PROGRESS  5. Patient will be able to ambulate at least 300 feet during 2MWT with LRAD to demonstrate improved ability to perform functional mobility and associated tasks. Baseline: 170 feet Goal status: IN PROGRESS  PLAN: PT FREQUENCY: 2x/week  PT DURATION: 8 weeks  PLANNED INTERVENTIONS: Therapeutic exercises, Therapeutic activity, Neuromuscular re-education, Balance training, Gait training, Patient/Family education, Joint manipulation, Joint mobilization, Stair training, Aquatic Therapy, Dry Needling, Electrical stimulation, Spinal manipulation, Spinal mobilization, Cryotherapy, Moist heat, scar mobilization, Taping, Traction, Ultrasound, Biofeedback, Ionotophoresis 35m/ml Dexamethasone, and Manual therapy.   PLAN FOR NEXT SESSION: Progress functional LE strength, work on ambulation tolerance.  CIhor Austin LPTA/CLT; CDelana [id number] [phone] 5:48 PM, 05/30/22

## 2022-06-02 ENCOUNTER — Encounter (HOSPITAL_COMMUNITY): Payer: 59 | Admitting: Physical Therapy

## 2022-06-03 ENCOUNTER — Ambulatory Visit
Admission: RE | Admit: 2022-06-03 | Discharge: 2022-06-03 | Disposition: A | Discharge status: Home / Self Care | Payer: 59 | Source: Ambulatory Visit | Attending: Gastroenterology | Admitting: Gastroenterology

## 2022-06-03 DIAGNOSIS — K7581 Nonalcoholic steatohepatitis (NASH): Secondary | ICD-10-CM | POA: Insufficient documentation

## 2022-06-03 DIAGNOSIS — K7460 Unspecified cirrhosis of liver: Secondary | ICD-10-CM | POA: Insufficient documentation

## 2022-06-03 DIAGNOSIS — R188 Other ascites: Secondary | ICD-10-CM | POA: Diagnosis not present

## 2022-06-03 DIAGNOSIS — K7469 Other cirrhosis of liver: Secondary | ICD-10-CM | POA: Diagnosis not present

## 2022-06-03 MED ORDER — ALBUMIN HUMAN 25 % IV SOLN
25.0000 g | Freq: Once | INTRAVENOUS | Status: AC
  Administered 2022-06-03: 25 g via INTRAVENOUS

## 2022-06-03 MED ORDER — ALBUMIN HUMAN 25 % IV SOLN: 25.0000 g | Freq: Once | INTRAVENOUS | Status: AC

## 2022-06-03 MED ORDER — LIDOCAINE HCL (PF) 1 % IJ SOLN
15.0000 mL | Freq: Once | INTRAMUSCULAR | Status: AC
  Administered 2022-06-03: 15 mL via SUBCUTANEOUS

## 2022-06-03 MED ORDER — ALBUMIN HUMAN 25 % IV SOLN
INTRAVENOUS | Status: AC
  Administered 2022-06-03: 25 g via INTRAVENOUS
  Filled 2022-06-03: qty 200

## 2022-06-03 NOTE — Procedures (Signed)
PROCEDURE SUMMARY:  Successful image-guided paracentesis from the right lower abdomen.  Yielded 8 liters of yellow fluid.  No immediate complications.  EBL = trace. Patient tolerated well.   Specimen was not sent for labs.  Please see imaging section of Epic for full dictation.   Lura Em PA-C 06/03/2022 1:32 PM

## 2022-06-04 ENCOUNTER — Inpatient Hospital Stay (HOSPITAL_BASED_OUTPATIENT_CLINIC_OR_DEPARTMENT_OTHER): Payer: 59 | Admitting: Hematology

## 2022-06-04 DIAGNOSIS — E559 Vitamin D deficiency, unspecified: Secondary | ICD-10-CM

## 2022-06-04 DIAGNOSIS — D500 Iron deficiency anemia secondary to blood loss (chronic): Secondary | ICD-10-CM | POA: Diagnosis not present

## 2022-06-04 DIAGNOSIS — E538 Deficiency of other specified B group vitamins: Secondary | ICD-10-CM

## 2022-06-04 DIAGNOSIS — D561 Beta thalassemia: Secondary | ICD-10-CM

## 2022-06-04 LAB — CBC
HCT: 20.1 % — ABNORMAL LOW (ref 36.0–46.0)
Hemoglobin: 6.1 g/dL — CL (ref 12.0–15.0)
MCH: 24 pg — ABNORMAL LOW (ref 26.0–34.0)
MCHC: 30.3 g/dL (ref 30.0–36.0)
MCV: 79.1 fL — ABNORMAL LOW (ref 80.0–100.0)
Platelets: 134 10*3/uL — ABNORMAL LOW (ref 150–400)
RBC: 2.54 MIL/uL — ABNORMAL LOW (ref 3.87–5.11)
RDW: 20.9 % — ABNORMAL HIGH (ref 11.5–15.5)
WBC: 3.3 10*3/uL — ABNORMAL LOW (ref 4.0–10.5)
nRBC: 0 % (ref 0.0–0.2)

## 2022-06-04 LAB — PREPARE RBC (CROSSMATCH)

## 2022-06-04 LAB — SAMPLE TO BLOOD BANK

## 2022-06-04 NOTE — Progress Notes (Signed)
CRITICAL VALUE ALERT Critical value received:  HGB 6.1 Date of notification:  06-03-2022 Time of notification: 10:39 am. Critical value read back:  Yes.   Nurse who received alert:  B.Maicey Barrientez RN. MD notified time and response:  Katragadda @ 11:30 am. Standing orders verified. Patient will receive 2 units of blood 06-05-2022.

## 2022-06-05 ENCOUNTER — Inpatient Hospital Stay: Payer: 59

## 2022-06-05 DIAGNOSIS — D500 Iron deficiency anemia secondary to blood loss (chronic): Secondary | ICD-10-CM | POA: Diagnosis not present

## 2022-06-05 MED ORDER — ACETAMINOPHEN 325 MG PO TABS
650.0000 mg | ORAL_TABLET | Freq: Once | ORAL | Status: AC
  Administered 2022-06-05: 650 mg via ORAL
  Filled 2022-06-05: qty 2

## 2022-06-05 MED ORDER — SODIUM CHLORIDE 0.9% IV SOLUTION
250.0000 mL | Freq: Once | INTRAVENOUS | Status: AC
  Administered 2022-06-05: 250 mL via INTRAVENOUS

## 2022-06-05 NOTE — Progress Notes (Signed)
Patient tolerated blood transfusion with no complaints voiced. Peripheral IV site clean and dry with good blood return noted before and after infusion. Band aid applied. VSS with discharge and left in satisfactory condition with no s/s of distress noted.

## 2022-06-05 NOTE — Patient Instructions (Signed)
[facility]  Discharge Instructions: Thank you for choosing [facility] to provide your oncology and hematology care.  If you have a lab appointment with the [facility], please come in thru the Main Entrance and check in at the main information desk.  Wear comfortable clothing and clothing appropriate for easy access to any Portacath or PICC line.   We strive to give you quality time with your provider. You may need to reschedule your appointment if you arrive late (15 or more minutes).  Arriving late affects you and other patients whose appointments are after yours.  Also, if you miss three or more appointments without notifying the office, you may be dismissed from the clinic at the provider's discretion.      For prescription refill requests, have your pharmacy contact our office and allow 72 hours for refills to be completed.    Today you received the following 2 units of blood, return as scheduled.   To help prevent nausea and vomiting after your treatment, we encourage you to take your nausea medication as directed.  BELOW ARE SYMPTOMS THAT SHOULD BE REPORTED IMMEDIATELY: *FEVER GREATER THAN 100.4 F (38 C) OR HIGHER *CHILLS OR SWEATING *NAUSEA AND VOMITING THAT IS NOT CONTROLLED WITH YOUR NAUSEA MEDICATION *UNUSUAL SHORTNESS OF BREATH *UNUSUAL BRUISING OR BLEEDING *URINARY PROBLEMS (pain or burning when urinating, or frequent urination) *BOWEL PROBLEMS (unusual diarrhea, constipation, pain near the anus) TENDERNESS IN MOUTH AND THROAT WITH OR WITHOUT PRESENCE OF ULCERS (sore throat, sores in mouth, or a toothache) UNUSUAL RASH, SWELLING OR PAIN  UNUSUAL VAGINAL DISCHARGE OR ITCHING   Items with * indicate a potential emergency and should be followed up as soon as possible or go to the Emergency Department if any problems should occur.  Please show the CHEMOTHERAPY ALERT CARD or IMMUNOTHERAPY ALERT CARD at check-in to the Emergency Department  and triage nurse.  Should you have questions after your visit or need to cancel or reschedule your appointment, please contact [facility] [phone]  and follow the prompts.  Office hours are 8:00 a.m. to 4:30 p.m. Monday - Friday. Please note that voicemails left after 4:00 p.m. may not be returned until the following business day.  We are closed weekends and major holidays. You have access to a nurse at all times for urgent questions. Please call the main number to the clinic [phone] and follow the prompts.  For any non-urgent questions, you may also contact your provider using MyChart. We now offer e-Visits for anyone 41 and older to request care online for non-urgent symptoms. For details visit [email or web address].   Also download the MyChart app! Go to the app store, search "MyChart", open the app, select [facility], and log in with your MyChart username and password.  Masks are optional in the cancer centers. If you would like for your care team to wear a mask while they are taking care of you, please let them know. You may have one support person who is at least 63 years old accompany you for your appointments.

## 2022-06-06 ENCOUNTER — Telehealth: Payer: Self-pay

## 2022-06-06 ENCOUNTER — Ambulatory Visit (HOSPITAL_COMMUNITY): Payer: 59

## 2022-06-06 DIAGNOSIS — K7460 Unspecified cirrhosis of liver: Secondary | ICD-10-CM

## 2022-06-06 DIAGNOSIS — R262 Difficulty in walking, not elsewhere classified: Secondary | ICD-10-CM | POA: Diagnosis not present

## 2022-06-06 DIAGNOSIS — M6281 Muscle weakness (generalized): Secondary | ICD-10-CM | POA: Diagnosis not present

## 2022-06-06 DIAGNOSIS — R2689 Other abnormalities of gait and mobility: Secondary | ICD-10-CM | POA: Diagnosis not present

## 2022-06-06 LAB — TYPE AND SCREEN
ABO/RH(D): A POS
Antibody Screen: NEGATIVE
Unit division: 0
Unit division: 0

## 2022-06-06 LAB — BPAM RBC
Blood Product Expiration Date: 202311082359
Blood Product Expiration Date: 202311172359
ISSUE DATE / TIME: 202310191056
ISSUE DATE / TIME: 202310191251
Unit Type and Rh: 6200
Unit Type and Rh: 6200

## 2022-06-06 NOTE — Patient Outreach (Signed)
  Care Coordination   06/06/2022 Name: Rachel Huber MRN: [id number] DOB: [birth year]   Care Coordination Outreach Attempts:  An unsuccessful telephone outreach was attempted for a scheduled appointment today.  Follow Up Plan:  Additional outreach attempts will be made to offer the patient care coordination information and services.   Encounter Outcome:  No Answer  Care Coordination Interventions Activated:  No   Care Coordination Interventions:  No, not indicated    Noreene Larsson RN, MSN, [facility]  Mobile: [phone]

## 2022-06-06 NOTE — Therapy (Signed)
OUTPATIENT PHYSICAL THERAPY TREATMENT  Patient Name: Rachel Huber MRN: [id number] DOB:[birth year], 66 y.o., female Today's Date: 06/06/2022   PT End of Session - 06/06/22 1037     Visit Number 8    Number of Visits 18    Date for PT Re-Evaluation 06/26/22    Authorization Type [employer] (no VL)    Progress Note Due on Visit 10    PT Start Time 1035    PT Stop Time 1114    PT Time Calculation (min) 39 min    Activity Tolerance Patient tolerated treatment well;Patient limited by pain;Patient limited by fatigue    Behavior During Therapy [facility] for tasks assessed/performed                 Past Medical History:  Diagnosis Date   Anxiety    Arthritis    Cirrhosis of liver ([place])    Diabetes mellitus without complication (HCC)    Dyspnea    DOE   GERD (gastroesophageal reflux disease)    Grade IV internal hemorrhoids    Contingency plan for any future admissions for severe anemia in the setting of persistent GI bleed:  nuclear medicine tagged RBC scan to assess for location of GI bleeding - if from rectum, would then proceed to angiogram with possible repeat embolization.  Please notify IR in this case.   Hepatitis    PAST   Hypertension    Neuropathy    Neuropathy, diabetic ([place])    Pneumonia    Sinus complaint    Sleep apnea    CPAP   Thalassemia minor 1992   Past Surgical History:  Procedure Laterality Date   ABDOMINAL HYSTERECTOMY     BREAST BIOPSY Right 02/15/2018   [place] bx 6-6:30 ribbon shape, ONE CORE FRAGMENT WITH FIBROSIS. ONE CORE FRAGMENT WITH PORTION OF A DILATED   BREAST BIOPSY Right 02/15/2018   [place] bx 9:00 heart shape, USUAL DUCTAL HYPERPLASIA   BREAST LUMPECTOMY Right 03/09/2018   Procedure: BREAST LUMPECTOMY x 2;  Surgeon: Benjamine Sprague, DO;  Location: [facility] ORS;  Service: General;  Laterality: Right;   CATARACT EXTRACTION W/PHACO Left 06/11/2016   Procedure: CATARACT EXTRACTION PHACO AND INTRAOCULAR LENS PLACEMENT (IOC);  Surgeon: Estill Cotta, MD;  Location: [facility] ORS;  Service: Ophthalmology;  Laterality: Left;  Lot # [id number] H US:01:38.6 AP%:26.4 CDE:44.15   CATARACT EXTRACTION W/PHACO Right 06/15/2018   Procedure: CATARACT EXTRACTION PHACO AND INTRAOCULAR LENS PLACEMENT (IOC);  Surgeon: Birder Robson, MD;  Location: [facility] ORS;  Service: Ophthalmology;  Laterality: Right;  [place] 00:38.2 CDE 4.23 Fluid Pack Lot # [id number]   COLONOSCOPIES     COLONOSCOPY WITH PROPOFOL N/A 10/10/2020   Procedure: COLONOSCOPY WITH PROPOFOL;  Surgeon: Lesly Rubenstein, MD;  Location: [facility] ENDOSCOPY;  Service: Endoscopy;  Laterality: N/A;   COLONOSCOPY WITH PROPOFOL N/A 11/20/2020   Procedure: COLONOSCOPY WITH PROPOFOL;  Surgeon: Lesly Rubenstein, MD;  Location: [facility] ENDOSCOPY;  Service: Endoscopy;  Laterality: N/A;  DM STAT CBC, BMP COVID POSITIVE 09/02/2020   CTR     ESOPHAGOGASTRODUODENOSCOPY (EGD) WITH PROPOFOL N/A 10/10/2020   Procedure: ESOPHAGOGASTRODUODENOSCOPY (EGD) WITH PROPOFOL;  Surgeon: Lesly Rubenstein, MD;  Location: [facility] ENDOSCOPY;  Service: Endoscopy;  Laterality: N/A;  COVID POSITIVE 10/08/2020   FLEXIBLE SIGMOIDOSCOPY N/A 02/06/2022   Procedure: FLEXIBLE SIGMOIDOSCOPY;  Surgeon: Lesly Rubenstein, MD;  Location: [facility] ENDOSCOPY;  Service: Endoscopy;  Laterality: N/A;  Patient requests anesthesia   IR ANGIOGRAM SELECTIVE EACH ADDITIONAL VESSEL  02/07/2022  IR ANGIOGRAM SELECTIVE EACH ADDITIONAL VESSEL  02/07/2022   IR ANGIOGRAM VISCERAL SELECTIVE  02/07/2022   IR EMBO ARTERIAL NOT HEMORR HEMANG INC GUIDE ROADMAPPING  02/07/2022   IR PARACENTESIS  12/17/2021   IR PARACENTESIS  03/25/2022   IR RADIOLOGIST EVAL & MGMT  03/18/2022   IR RADIOLOGIST EVAL & MGMT  05/07/2022   IR RADIOLOGIST EVAL & MGMT  05/16/2022   IR US GUIDE VASC ACCESS RIGHT  02/07/2022   JOINT REPLACEMENT     KNEE SURGERY Right 09/24/2017   plates and pins   TOTAL SHOULDER ARTHROPLASTY Right 09/27/2015   Patient Active Problem List   Diagnosis Date Noted    Grade IV internal hemorrhoids 05/16/2022   Acute blood loss anemia 03/20/2022   Acute on chronic blood loss anemia 03/05/2022   Thrombocytopenia ([place]) 03/05/2022   Fever 02/08/2022   Pancytopenia ([place]) 02/07/2022   GI bleeding 02/04/2022   Chronic kidney disease, stage 3b ([place]) 02/04/2022   Depression with anxiety 02/04/2022   Liver cirrhosis secondary to NASH ([place])    Acute GI hemorrhage 01/28/2022   AKI (acute kidney injury) ([place]) 01/27/2022   Insomnia 01/27/2022   Iron deficiency anemia due to chronic blood loss 02/13/2021   History of uterine cancer 02/13/2021   Decompensated hepatic cirrhosis ([place]) 10/25/2020   OSA on CPAP 10/25/2020   Glossitis 10/25/2020   Hyponatremia 10/08/2020   Acute respiratory failure with hypoxia ([place]) 09/10/2020   Pneumonia due to COVID-19 virus 09/08/2020   Hip fracture, right ([place]) 09/04/2020   Closed right hip fracture ([place]) 09/02/2020   Type 2 diabetes mellitus with hyperlipidemia ([place]) 09/02/2020   Thalassemia minor    HLD (hyperlipidemia)    Acute kidney injury superimposed on CKD ([place])    Depression    Fall at home, initial encounter    Nondisplaced fracture of greater trochanter of right femur, initial encounter for closed fracture ([place])     PCP: Frazier Richards MD  REFERRING PROVIDER: Tinnie Gens, NP   REFERRING DIAG: PT eval/tx for strength and conditioning   THERAPY DIAG:  Muscle weakness (generalized)  Other abnormalities of gait and mobility  Difficulty in walking, not elsewhere classified  Cirrhosis, non-alcoholic ([place])  Rationale for Evaluation and Treatment Rehabilitation  ONSET DATE: Chronic  SUBJECTIVE:   SUBJECTIVE STATEMENT:  Patient reports knee pain continues due to swelling; states she plans to order compression stockings and and butler to assist with swelling. Having paracentesis weekly now; pulling about 8 Liters of fluid off each time  PERTINENT HISTORY: Falls, weakness,DM, R shoulder  reverse replacement anFeb 2019 R knee pain ([place])   PAIN:  Are you having pain? Yes: NPRS scale: 5/10 Pain location: knees BLE Pain description: aching  Aggravating factors: use, movement, WB Relieving factors: rest, meds, non WB  PRECAUTIONS: Fall  WEIGHT BEARING RESTRICTIONS No  FALLS:  Has patient fallen in last 6 months? Yes. Number of falls 1  LIVING ENVIRONMENT: Lives with: lives with their family and lives with their spouse Lives in: House/apartment Stairs: No Has following equipment at home: Single point cane and Environmental consultant - 2 wheeled  OCCUPATION: Retired/ Disability   PLOF: Needs assistance with ADLs  PATIENT GOALS  Get to point of not having to use cane/ improve balance    OBJECTIVE:   DIAGNOSTIC FINDINGS: NA   COGNITION:  Overall cognitive status: Within functional limits for tasks assessed     SENSATION: WFL   LOWER EXTREMITY MMT:  MMT Right eval Left eval  Hip  flexion 4 4  Hip extension    Hip abduction    Hip adduction    Hip internal rotation    Hip external rotation    Knee flexion 4 4  Knee extension 4 4  Ankle dorsiflexion 4 4  Ankle plantarflexion    Ankle inversion    Ankle eversion     (Blank rows = not tested)  FUNCTIONAL TESTS:  5 times sit to stand: 28.5 second with use of hands  2 minute walk test: 170 feet  GAIT: Distance walked: 170 feet Assistive device utilized: Walker - 2 wheeled Level of assistance: Modified independence and SBA Comments: flexed trunk, decreased stride  TODAY'S TREATMENT: 06/06/22 Standing: Heel raises 2 x 10 Hip abduction 2 x 10 each Sidestepping table length x 3 down and back Mini squats x 10 Tandem stance 2 x 20" each  05/30/22  2MWT 122f with RW  STS 10x elevated height for no HHA needed  Heel raises  Toe raises  Marching  Squat  Educated on benefits of compression garments and use of butler to assist with donning. Measurements taken and given handout for ETI.  05/26/22 Nustep  (seat 12) lv 2 4 min dynamic warmup  Step up 6 inch x 10 HHA x 2 Clinic ambulation 170 feet with RW  Sit to stand x10   05/23/22            Nustep hills 3 x 6' level 3            Step up 6" x 10 B            Marching x 15             Minisquat x 10             Side step at // with green theraband  x 3 RT            Sit to stand x 10 with green theraband      05/19/22                        Standing:  Heel raises  20X                         Toeraises 20X Marching 10X 2 sets 1 HHA with intermittent 2HHA                        Mini squat 10X 2 sets                         Side step at // x 2 RT    Sit to stand x10 no UE's from standard chair, no UE    Ambulation around clinic with RW for activity tolerance X150 feet without rest                     05/16/22                        Standing:                        Heel raises x 10                        Marching x 10  Mini squat x 10                         Side step at // x 2 RT                         Sit to stand x10   PATIENT EDUCATION:  Education details: on eval findings, POC and HEP  Person educated: Patient Education method: Explanation Education comprehension: verbalized understanding   HOME EXERCISE PROGRAM:            05/30/22:  Begin walking program with RW  04/19/22 - Heel Raises with Counter Support  - 2 x daily - 7 x weekly - 1 sets - 10 reps - 3-5" hold - Mini Squat with Counter Support  - 2 x daily - 7 x weekly - 1 sets - 10 reps - 3-5" hold - Sit to Stand with Counter Support  - 2 x daily - 7 x weekly - 1 sets - 10 reps9/              Access Code: [email or web address] URL: [email or web address] Date: 05/01/2022 Prepared by: Josue Hector  Exercises - Seated Heel Toe Raises  - 2 x daily - 7 x weekly - 1 sets - 10 reps - Seated Long Arc Quad  - 2 x daily - 7 x weekly - 1 sets - 10 reps - Seated March  - 2 x daily - 7 x weekly - 1 sets - 10 reps - Supine Active Straight Leg  Raise  - 7 x weekly - 1 sets - 10 reps  ASSESSMENT:  CLINICAL IMPRESSION:  Today's session continued to address lower extremity strengthening and balance. Patient continues to fatigue quickly and need frequent rest breaks throughout treatment. All standing exercises done with upper extremity assist for balance today. Occasionally short of breath. More difficulty with sit to stand today. Patient will benefit from continued skilled therapy services  to address deficits and promote return to optimal function.     OBJECTIVE IMPAIRMENTS Abnormal gait, decreased activity tolerance, decreased balance, decreased mobility, difficulty walking, decreased ROM, decreased strength, improper body mechanics, and pain.   ACTIVITY LIMITATIONS lifting, bending, standing, squatting, stairs, transfers, and locomotion level  PARTICIPATION LIMITATIONS: meal prep, cleaning, laundry, shopping, community activity, and yard work  PERSONAL FACTORS Past/current experiences and Time since onset of injury/illness/exacerbation are also affecting patient's functional outcome.   REHAB POTENTIAL: Good  CLINICAL DECISION MAKING: Stable/uncomplicated  EVALUATION COMPLEXITY: Low   GOALS: SHORT TERM GOALS: Target date: 05/29/2022  Patient will be independent with initial HEP and self-management strategies to improve functional outcomes Baseline:  Goal status: IN PROGRESS  2. Patient will be able to stand, with minimal support, at least 5 minutes for improved ability to perform meal preparation/ cooking/ grooming/ cleaning ADLs.  Baseline: 2.5 minutes Goal status: IN PROGRESS  LONG TERM GOALS: Target date: 06/26/2022  Patient will be independent with advanced HEP and self-management strategies to improve functional outcomes Baseline:  Goal status: IN PROGRESS  2.  Patient will report at least 65% overall improvement in subjective complaint to indicate improvement in ability to perform ADLs. Baseline:  Goal status: IN  PROGRESS  3.  Patient will be able to stand, with minimal support, at least 10 minutes for improved ability to perform meal preparation/ cooking/ grooming/ cleaning ADLs.  Baseline: 2.5 minutes Goal status: IN PROGRESS  4. Patient will have equal to or >  4+/5 MMT throughout BLE to improve ability to perform functional mobility, stair ambulation and ADLs.  Baseline: See MMT Goal status: IN PROGRESS  5. Patient will be able to ambulate at least 300 feet during 2MWT with LRAD to demonstrate improved ability to perform functional mobility and associated tasks. Baseline: 170 feet Goal status: IN PROGRESS  PLAN: PT FREQUENCY: 2x/week  PT DURATION: 8 weeks  PLANNED INTERVENTIONS: Therapeutic exercises, Therapeutic activity, Neuromuscular re-education, Balance training, Gait training, Patient/Family education, Joint manipulation, Joint mobilization, Stair training, Aquatic Therapy, Dry Needling, Electrical stimulation, Spinal manipulation, Spinal mobilization, Cryotherapy, Moist heat, scar mobilization, Taping, Traction, Ultrasound, Biofeedback, Ionotophoresis 56m/ml Dexamethasone, and Manual therapy.   PLAN FOR NEXT SESSION: Progress functional LE strength, work on ambulation tolerance.  11:23 AM, 06/06/22 Arnie Maiolo Small Troyce Febo MPT [place] [facility] [place] #[phone]

## 2022-06-09 ENCOUNTER — Ambulatory Visit (HOSPITAL_COMMUNITY): Payer: 59 | Admitting: Physical Therapy

## 2022-06-09 DIAGNOSIS — R262 Difficulty in walking, not elsewhere classified: Secondary | ICD-10-CM

## 2022-06-09 DIAGNOSIS — R2689 Other abnormalities of gait and mobility: Secondary | ICD-10-CM | POA: Diagnosis not present

## 2022-06-09 DIAGNOSIS — K7460 Unspecified cirrhosis of liver: Secondary | ICD-10-CM | POA: Diagnosis not present

## 2022-06-09 DIAGNOSIS — M6281 Muscle weakness (generalized): Secondary | ICD-10-CM | POA: Diagnosis not present

## 2022-06-09 NOTE — Therapy (Signed)
OUTPATIENT PHYSICAL THERAPY TREATMENT  Patient Name: Rachel Huber MRN: [id number] DOB:July 13, 1956, 66 y.o., female Today's Date: 06/09/2022   PT End of Session - 06/09/22 1644     Visit Number 9    Number of Visits 18    Date for PT Re-Evaluation 06/26/22    Authorization Type [employer] (no VL)    Progress Note Due on Visit 10    PT Start Time 1519    PT Stop Time 1601    PT Time Calculation (min) 42 min    Activity Tolerance Patient tolerated treatment well;Patient limited by pain;Patient limited by fatigue    Behavior During Therapy [facility] for tasks assessed/performed                  Past Medical History:  Diagnosis Date   Anxiety    Arthritis    Cirrhosis of liver ([place])    Diabetes mellitus without complication (HCC)    Dyspnea    DOE   GERD (gastroesophageal reflux disease)    Grade IV internal hemorrhoids    Contingency plan for any future admissions for severe anemia in the setting of persistent GI bleed:  nuclear medicine tagged RBC scan to assess for location of GI bleeding - if from rectum, would then proceed to angiogram with possible repeat embolization.  Please notify IR in this case.   Hepatitis    PAST   Hypertension    Neuropathy    Neuropathy, diabetic ([place])    Pneumonia    Sinus complaint    Sleep apnea    CPAP   Thalassemia minor 1992   Past Surgical History:  Procedure Laterality Date   ABDOMINAL HYSTERECTOMY     BREAST BIOPSY Right 02/15/2018   [place] bx 6-6:30 ribbon shape, ONE CORE FRAGMENT WITH FIBROSIS. ONE CORE FRAGMENT WITH PORTION OF A DILATED   BREAST BIOPSY Right 02/15/2018   [place] bx 9:00 heart shape, USUAL DUCTAL HYPERPLASIA   BREAST LUMPECTOMY Right 03/09/2018   Procedure: BREAST LUMPECTOMY x 2;  Surgeon: Benjamine Sprague, DO;  Location: [facility] ORS;  Service: General;  Laterality: Right;   CATARACT EXTRACTION W/PHACO Left 06/11/2016   Procedure: CATARACT EXTRACTION PHACO AND INTRAOCULAR LENS PLACEMENT (IOC);  Surgeon: Estill Cotta, MD;  Location: [facility] ORS;  Service: Ophthalmology;  Laterality: Left;  Lot # [id number] H US:01:38.6 AP%:26.4 CDE:44.15   CATARACT EXTRACTION W/PHACO Right 06/15/2018   Procedure: CATARACT EXTRACTION PHACO AND INTRAOCULAR LENS PLACEMENT (IOC);  Surgeon: Birder Robson, MD;  Location: [facility] ORS;  Service: Ophthalmology;  Laterality: Right;  [place] 00:38.2 CDE 4.23 Fluid Pack Lot # [id number]   COLONOSCOPIES     COLONOSCOPY WITH PROPOFOL N/A 10/10/2020   Procedure: COLONOSCOPY WITH PROPOFOL;  Surgeon: Lesly Rubenstein, MD;  Location: [facility] ENDOSCOPY;  Service: Endoscopy;  Laterality: N/A;   COLONOSCOPY WITH PROPOFOL N/A 11/20/2020   Procedure: COLONOSCOPY WITH PROPOFOL;  Surgeon: Lesly Rubenstein, MD;  Location: [facility] ENDOSCOPY;  Service: Endoscopy;  Laterality: N/A;  DM STAT CBC, BMP COVID POSITIVE 09/02/2020   CTR     ESOPHAGOGASTRODUODENOSCOPY (EGD) WITH PROPOFOL N/A 10/10/2020   Procedure: ESOPHAGOGASTRODUODENOSCOPY (EGD) WITH PROPOFOL;  Surgeon: Lesly Rubenstein, MD;  Location: [facility] ENDOSCOPY;  Service: Endoscopy;  Laterality: N/A;  COVID POSITIVE 10/08/2020   FLEXIBLE SIGMOIDOSCOPY N/A 02/06/2022   Procedure: FLEXIBLE SIGMOIDOSCOPY;  Surgeon: Lesly Rubenstein, MD;  Location: [facility] ENDOSCOPY;  Service: Endoscopy;  Laterality: N/A;  Patient requests anesthesia   IR ANGIOGRAM SELECTIVE EACH ADDITIONAL VESSEL  02/07/2022  IR ANGIOGRAM SELECTIVE EACH ADDITIONAL VESSEL  02/07/2022   IR ANGIOGRAM VISCERAL SELECTIVE  02/07/2022   IR EMBO ARTERIAL NOT HEMORR HEMANG INC GUIDE ROADMAPPING  02/07/2022   IR PARACENTESIS  12/17/2021   IR PARACENTESIS  03/25/2022   IR RADIOLOGIST EVAL & MGMT  03/18/2022   IR RADIOLOGIST EVAL & MGMT  05/07/2022   IR RADIOLOGIST EVAL & MGMT  05/16/2022   IR US GUIDE VASC ACCESS RIGHT  02/07/2022   JOINT REPLACEMENT     KNEE SURGERY Right 09/24/2017   plates and pins   TOTAL SHOULDER ARTHROPLASTY Right 09/27/2015   Patient Active Problem List   Diagnosis Date Noted    Grade IV internal hemorrhoids 05/16/2022   Acute blood loss anemia 03/20/2022   Acute on chronic blood loss anemia 03/05/2022   Thrombocytopenia ([place]) 03/05/2022   Fever 02/08/2022   Pancytopenia ([place]) 02/07/2022   GI bleeding 02/04/2022   Chronic kidney disease, stage 3b ([place]) 02/04/2022   Depression with anxiety 02/04/2022   Liver cirrhosis secondary to NASH ([place])    Acute GI hemorrhage 01/28/2022   AKI (acute kidney injury) ([place]) 01/27/2022   Insomnia 01/27/2022   Iron deficiency anemia due to chronic blood loss 02/13/2021   History of uterine cancer 02/13/2021   Decompensated hepatic cirrhosis ([place]) 10/25/2020   OSA on CPAP 10/25/2020   Glossitis 10/25/2020   Hyponatremia 10/08/2020   Acute respiratory failure with hypoxia ([place]) 09/10/2020   Pneumonia due to COVID-19 virus 09/08/2020   Hip fracture, right ([place]) 09/04/2020   Closed right hip fracture ([place]) 09/02/2020   Type 2 diabetes mellitus with hyperlipidemia ([place]) 09/02/2020   Thalassemia minor    HLD (hyperlipidemia)    Acute kidney injury superimposed on CKD ([place])    Depression    Fall at home, initial encounter    Nondisplaced fracture of greater trochanter of right femur, initial encounter for closed fracture ([place])     PCP:  [place] MD  REFERRING PROVIDER: Tinnie Gens, NP   REFERRING DIAG: PT eval/tx for strength and conditioning   THERAPY DIAG:  Muscle weakness (generalized)  Other abnormalities of gait and mobility  Cirrhosis, non-alcoholic ([place])  Difficulty in walking, not elsewhere classified  Rationale for Evaluation and Treatment Rehabilitation  ONSET DATE: Chronic  SUBJECTIVE:   SUBJECTIVE STATEMENT:  Patient reports she is so swollen today she doesn't know how much she can do. States the removal of fluid each week is getting to where it's really not helping and they've had to discontinue the diuretic due to kidney function.  Noted edema in bil UE/LE with Lt side being  worse.  Reports her knee pain continues as always.  Has not yet ordered her compression stockings and and butler but it is in her [employer] cart and intends on ordering when she gets home.  Noted SOB at rest.  PERTINENT HISTORY: Paracentesis each week (Tuesdays) with 8L of removal Falls, weakness,DM, R shoulder reverse replacement an Feb 2019 R knee pain ([place])   PAIN:  Are you having pain? Yes: NPRS scale: 5/10 Pain location: knees BLE Pain description: aching  Aggravating factors: use, movement, WB Relieving factors: rest, meds, non WB  PRECAUTIONS: Fall  WEIGHT BEARING RESTRICTIONS No  FALLS:  Has patient fallen in last 6 months? Yes. Number of falls 1  LIVING ENVIRONMENT: Lives with: lives with their family and lives with their spouse Lives in: House/apartment Stairs: No Has following equipment at home: Single point cane and Environmental consultant - 2 wheeled  OCCUPATION:  Retired/ Disability   PLOF: Needs assistance with ADLs  PATIENT GOALS  Get to point of not having to use cane/ improve balance    OBJECTIVE:   DIAGNOSTIC FINDINGS: NA   COGNITION:  Overall cognitive status: Within functional limits for tasks assessed     SENSATION: WFL   LOWER EXTREMITY MMT:  MMT Right eval Left eval  Hip flexion 4 4  Hip extension    Hip abduction    Hip adduction    Hip internal rotation    Hip external rotation    Knee flexion 4 4  Knee extension 4 4  Ankle dorsiflexion 4 4  Ankle plantarflexion    Ankle inversion    Ankle eversion     (Blank rows = not tested)  FUNCTIONAL TESTS:  5 times sit to stand: 28.5 second with use of hands  2 minute walk test: 170 feet  GAIT: Distance walked: 170 feet Assistive device utilized: Walker - 2 wheeled Level of assistance: Modified independence and SBA Comments: flexed trunk, decreased stride  TODAY'S TREATMENT: 10.23.23 O2 sats monitored Seated:  LAQ 10X3" each (84-92%)  STS from standard chair with black foam no UE 10X (84%  after completing 5/rest and complete 5X more after return to 92%)  Standing:  with UE assist   Alternating marching 2X10   Hip abduction 2X10   Minisquats 10X  06/06/22 Standing: Heel raises 2 x 10 Hip abduction 2 x 10 each Sidestepping table length x 3 down and back Mini squats x 10 Tandem stance 2 x 20" each  05/30/22  2MWT 136f with RW  STS 10x elevated height for no HHA needed  Heel raises  Toe raises  Marching  Squat  Educated on benefits of compression garments and use of butler to assist with donning. Measurements taken and given handout for ETI.  05/26/22 Nustep (seat 12) lv 2 4 min dynamic warmup  Step up 6 inch x 10 HHA x 2 Clinic ambulation 170 feet with RW  Sit to stand x10   05/23/22            Nustep hills 3 x 6' level 3            Step up 6" x 10 B            Marching x 15             Minisquat x 10             Side step at // with green theraband  x 3 RT            Sit to stand x 10 with green theraband      05/19/22                        Standing:  Heel raises  20X                         Toeraises 20X Marching 10X 2 sets 1 HHA with intermittent 2HHA                        Mini squat 10X 2 sets                         Side step at // x 2 RT    Sit to stand x10 no UE's from standard chair, no UE  Ambulation around clinic with RW for activity tolerance X150 feet without rest                     05/16/22                        Standing:                        Heel raises x 10                        Marching x 10                         Mini squat x 10                         Side step at // x 2 RT                         Sit to stand x10   PATIENT EDUCATION:  Education details: on eval findings, POC and HEP  Person educated: Patient Education method: Explanation Education comprehension: verbalized understanding   HOME EXERCISE PROGRAM:            05/30/22:  Begin walking program with RW  04/19/22 - Heel Raises with Counter Support  - 2  x daily - 7 x weekly - 1 sets - 10 reps - 3-5" hold - Mini Squat with Counter Support  - 2 x daily - 7 x weekly - 1 sets - 10 reps - 3-5" hold - Sit to Stand with Counter Support  - 2 x daily - 7 x weekly - 1 sets - 10 reps9/              Access Code: [email or web address] URL: [email or web address] Date: 05/01/2022 Prepared by: Josue Hector  Exercises - Seated Heel Toe Raises  - 2 x daily - 7 x weekly - 1 sets - 10 reps - Seated Long Arc Quad  - 2 x daily - 7 x weekly - 1 sets - 10 reps - Seated March  - 2 x daily - 7 x weekly - 1 sets - 10 reps - Supine Active Straight Leg Raise  - 7 x weekly - 1 sets - 10 reps  ASSESSMENT:  CLINICAL IMPRESSION: Oxygen sats checked following transfer from wheelchair to standard chair with 84%.  Sats returned to 94% following 1 minute seated rest.  Difficulty keeping sats in therapeutic range today with frequent rest breaks to complete exercises (due to fatigue and sats).  Pt with noted "misery" due to ascites and all over edema in limbs causing early fatigue. Encouraged to stand more and sit back when seated as when she's forward tends to make her sats drop.  All exercises done with bil upper extremity assist for balance today. Patient will benefit from continued skilled therapy services  to address deficits and promote return to optimal function.     OBJECTIVE IMPAIRMENTS Abnormal gait, decreased activity tolerance, decreased balance, decreased mobility, difficulty walking, decreased ROM, decreased strength, improper body mechanics, and pain.   ACTIVITY LIMITATIONS lifting, bending, standing, squatting, stairs, transfers, and locomotion level  PARTICIPATION LIMITATIONS: meal prep, cleaning, laundry, shopping, community activity, and yard work  PERSONAL FACTORS Past/current experiences and Time since onset of injury/illness/exacerbation are also affecting patient's functional outcome.  REHAB POTENTIAL: Good  CLINICAL DECISION MAKING:  Stable/uncomplicated  EVALUATION COMPLEXITY: Low   GOALS: SHORT TERM GOALS: Target date: 05/29/2022  Patient will be independent with initial HEP and self-management strategies to improve functional outcomes Baseline:  Goal status: IN PROGRESS  2. Patient will be able to stand, with minimal support, at least 5 minutes for improved ability to perform meal preparation/ cooking/ grooming/ cleaning ADLs.  Baseline: 2.5 minutes Goal status: IN PROGRESS  LONG TERM GOALS: Target date: 06/26/2022  Patient will be independent with advanced HEP and self-management strategies to improve functional outcomes Baseline:  Goal status: IN PROGRESS  2.  Patient will report at least 65% overall improvement in subjective complaint to indicate improvement in ability to perform ADLs. Baseline:  Goal status: IN PROGRESS  3.  Patient will be able to stand, with minimal support, at least 10 minutes for improved ability to perform meal preparation/ cooking/ grooming/ cleaning ADLs.  Baseline: 2.5 minutes Goal status: IN PROGRESS  4. Patient will have equal to or > 4+/5 MMT throughout BLE to improve ability to perform functional mobility, stair ambulation and ADLs.  Baseline: See MMT Goal status: IN PROGRESS  5. Patient will be able to ambulate at least 300 feet during 2MWT with LRAD to demonstrate improved ability to perform functional mobility and associated tasks. Baseline: 170 feet Goal status: IN PROGRESS  PLAN: PT FREQUENCY: 2x/week  PT DURATION: 8 weeks  PLANNED INTERVENTIONS: Therapeutic exercises, Therapeutic activity, Neuromuscular re-education, Balance training, Gait training, Patient/Family education, Joint manipulation, Joint mobilization, Stair training, Aquatic Therapy, Dry Needling, Electrical stimulation, Spinal manipulation, Spinal mobilization, Cryotherapy, Moist heat, scar mobilization, Taping, Traction, Ultrasound, Biofeedback, Ionotophoresis 83m/ml Dexamethasone, and Manual  therapy.  PLAN FOR NEXT SESSION: Progress functional LE strength, work on ambulation tolerance. Complete 10th visit PN next session; monitor oxygen saturation.  4:44 PM, 06/09/22 ATeena Irani PTA/CLT CRedcrestPh: [phone]

## 2022-06-10 ENCOUNTER — Ambulatory Visit
Admission: RE | Admit: 2022-06-10 | Discharge: 2022-06-10 | Disposition: A | Discharge status: Home / Self Care | Payer: 59 | Source: Ambulatory Visit | Attending: Gastroenterology | Admitting: Gastroenterology

## 2022-06-10 DIAGNOSIS — R188 Other ascites: Secondary | ICD-10-CM | POA: Diagnosis not present

## 2022-06-10 DIAGNOSIS — K7460 Unspecified cirrhosis of liver: Secondary | ICD-10-CM | POA: Insufficient documentation

## 2022-06-10 DIAGNOSIS — K7581 Nonalcoholic steatohepatitis (NASH): Secondary | ICD-10-CM | POA: Diagnosis present

## 2022-06-10 DIAGNOSIS — K7469 Other cirrhosis of liver: Secondary | ICD-10-CM | POA: Diagnosis not present

## 2022-06-10 MED ORDER — ALBUMIN HUMAN 25 % IV SOLN
INTRAVENOUS | Status: AC
  Administered 2022-06-10: 25 g via INTRAVENOUS
  Filled 2022-06-10: qty 100

## 2022-06-10 MED ORDER — LIDOCAINE HCL (PF) 1 % IJ SOLN
10.0000 mL | Freq: Once | INTRAMUSCULAR | Status: AC
  Administered 2022-06-10: 10 mL via INTRADERMAL

## 2022-06-10 MED ORDER — ALBUMIN HUMAN 25 % IV SOLN: 25.0000 g | Freq: Once | INTRAVENOUS | Status: AC

## 2022-06-10 NOTE — Procedures (Signed)
Ultrasound-guided  therapeutic paracentesis performed yielding 8 liters of amber colored fluid. . No immediate complications. EBL is none.

## 2022-06-11 ENCOUNTER — Other Ambulatory Visit: Payer: Self-pay

## 2022-06-11 ENCOUNTER — Inpatient Hospital Stay: Payer: 59

## 2022-06-11 DIAGNOSIS — D649 Anemia, unspecified: Secondary | ICD-10-CM

## 2022-06-11 DIAGNOSIS — D61818 Other pancytopenia: Secondary | ICD-10-CM

## 2022-06-11 DIAGNOSIS — D500 Iron deficiency anemia secondary to blood loss (chronic): Secondary | ICD-10-CM

## 2022-06-11 LAB — CBC
HCT: 21.1 % — ABNORMAL LOW (ref 36.0–46.0)
Hemoglobin: 6.5 g/dL — CL (ref 12.0–15.0)
MCH: 24.6 pg — ABNORMAL LOW (ref 26.0–34.0)
MCHC: 30.8 g/dL (ref 30.0–36.0)
MCV: 79.9 fL — ABNORMAL LOW (ref 80.0–100.0)
Platelets: 140 10*3/uL — ABNORMAL LOW (ref 150–400)
RBC: 2.64 MIL/uL — ABNORMAL LOW (ref 3.87–5.11)
RDW: 21.3 % — ABNORMAL HIGH (ref 11.5–15.5)
WBC: 3.2 10*3/uL — ABNORMAL LOW (ref 4.0–10.5)
nRBC: 0 % (ref 0.0–0.2)

## 2022-06-11 LAB — PREPARE RBC (CROSSMATCH)

## 2022-06-11 LAB — SAMPLE TO BLOOD BANK

## 2022-06-11 MED ORDER — DIPHENHYDRAMINE HCL 25 MG PO CAPS
25.0000 mg | ORAL_CAPSULE | Freq: Once | ORAL | Status: AC
  Administered 2022-06-11: 25 mg via ORAL
  Filled 2022-06-11: qty 1

## 2022-06-11 MED ORDER — SODIUM CHLORIDE 0.9% IV SOLUTION: 250.0000 mL | Freq: Once | INTRAVENOUS | Status: DC

## 2022-06-11 MED ORDER — ACETAMINOPHEN 325 MG PO TABS
650.0000 mg | ORAL_TABLET | Freq: Once | ORAL | Status: AC
  Administered 2022-06-11: 650 mg via ORAL
  Filled 2022-06-11: qty 2

## 2022-06-11 NOTE — Progress Notes (Signed)
PICC Line order placed per Dr. Delton Coombes. Orders placed under sign and held.

## 2022-06-11 NOTE — Progress Notes (Signed)
Patient will be coming tomorrow for PICC vs Midline placement.  Orders are in for both, we will await physician to decide.  Patient will have port placed on Friday and they will pull CV line at that time.  Patient aware that she needs to be here tomorrow morning at 0745 and CV line will be placed at 0800.

## 2022-06-11 NOTE — Progress Notes (Unsigned)
CRITICAL VALUE ALERT Critical value received:  hgb 6.5 Date of notification:  06-11-22 Time of notification: 6153 Critical value read back:  Yes.   Nurse who received alert:  C. Tierre Gerard RN MD notified time and response:  Reb. Pennington PA-C, [id number], will give 2 units of blood per orders tomorrow.

## 2022-06-12 ENCOUNTER — Inpatient Hospital Stay: Payer: 59

## 2022-06-12 ENCOUNTER — Other Ambulatory Visit: Payer: Self-pay | Admitting: Radiology

## 2022-06-12 VITALS — BP 103/54 | HR 78 | Temp 96.8°F | Resp 20

## 2022-06-12 VITALS — BP 109/49 | HR 74 | Temp 98.6°F | Resp 18

## 2022-06-12 DIAGNOSIS — D500 Iron deficiency anemia secondary to blood loss (chronic): Secondary | ICD-10-CM | POA: Diagnosis not present

## 2022-06-12 MED ORDER — SODIUM CHLORIDE 0.9 % IV SOLN: INTRAVENOUS | Status: DC

## 2022-06-12 NOTE — H&P (Signed)
Referring Physician(s): Katragadda,Sreedhar  Supervising Physician: Markus Daft  Patient Status:  [facility]  Chief Complaint:  "I'm getting a port a cath"  Subjective: Pt known to IR service from multiple paracenteses, right thoracentesis on 10/01/21 and coil embolization of bilateral superior rectal arteries on 02/10/22. She has a hx of non alcoholic cirrhosis with prior TIPS 12/26/21 ([facility]) and revision 03/12/22, uterine cancer, DM, GERD, HTN, sleep apnea, vit B12/D deficiencies, iron def anemia and beta thalassemia intermedia. She has poor venous access and is in need of durable venous access for frequent blood transfusions. She presents today for port a cath placement.  She currently denies fever, headache, chest pain, abdominal/back pain, nausea, vomiting.  She does have some dyspnea with exertion, occasional cough, some rectal bleeding from? hemorrhoids and extremity edema.  Past Medical History:  Diagnosis Date   Anxiety    Arthritis    Cirrhosis of liver ([place])    Diabetes mellitus without complication (HCC)    Dyspnea    DOE   GERD (gastroesophageal reflux disease)    Grade IV internal hemorrhoids    Contingency plan for any future admissions for severe anemia in the setting of persistent GI bleed:  nuclear medicine tagged RBC scan to assess for location of GI bleeding - if from rectum, would then proceed to angiogram with possible repeat embolization.  Please notify IR in this case.   Hepatitis    PAST   Hypertension    Neuropathy    Neuropathy, diabetic ([place])    Pneumonia    Sinus complaint    Sleep apnea    CPAP   Thalassemia minor 1992   Past Surgical History:  Procedure Laterality Date   ABDOMINAL HYSTERECTOMY     BREAST BIOPSY Right 02/15/2018   [place] bx 6-6:30 ribbon shape, ONE CORE FRAGMENT WITH FIBROSIS. ONE CORE FRAGMENT WITH PORTION OF A DILATED   BREAST BIOPSY Right 02/15/2018   [place] bx 9:00 heart shape, USUAL DUCTAL HYPERPLASIA   BREAST LUMPECTOMY Right  03/09/2018   Procedure: BREAST LUMPECTOMY x 2;  Surgeon: Benjamine Sprague, DO;  Location: [facility] ORS;  Service: General;  Laterality: Right;   CATARACT EXTRACTION W/PHACO Left 06/11/2016   Procedure: CATARACT EXTRACTION PHACO AND INTRAOCULAR LENS PLACEMENT (IOC);  Surgeon: Estill Cotta, MD;  Location: [facility] ORS;  Service: Ophthalmology;  Laterality: Left;  Lot # [id number] H US:01:38.6 AP%:26.4 CDE:44.15   CATARACT EXTRACTION W/PHACO Right 06/15/2018   Procedure: CATARACT EXTRACTION PHACO AND INTRAOCULAR LENS PLACEMENT (IOC);  Surgeon: Birder Robson, MD;  Location: [facility] ORS;  Service: Ophthalmology;  Laterality: Right;  [place] 00:38.2 CDE 4.23 Fluid Pack Lot # [id number]   COLONOSCOPIES     COLONOSCOPY WITH PROPOFOL N/A 10/10/2020   Procedure: COLONOSCOPY WITH PROPOFOL;  Surgeon: Lesly Rubenstein, MD;  Location[facility] ENDOSCOPY;  Service: Endoscopy;  Laterality: N/A;   COLONOSCOPY WITH PROPOFOL N/A 11/20/2020   Procedure: COLONOSCOPY WITH PROPOFOL;  Surgeon: Lesly Rubenstein, MD;  Location: [facility] ENDOSCOPY;  Service: Endoscopy;  Laterality: N/A;  DM STAT CBC, BMP COVID POSITIVE 09/02/2020   CTR     ESOPHAGOGASTRODUODENOSCOPY (EGD) WITH PROPOFOL N/A 10/10/2020   Procedure: ESOPHAGOGASTRODUODENOSCOPY (EGD) WITH PROPOFOL;  Surgeon: Lesly Rubenstein, MD;  Location: [facility] ENDOSCOPY;  Service: Endoscopy;  Laterality: N/A;  COVID POSITIVE 10/08/2020   FLEXIBLE SIGMOIDOSCOPY N/A 02/06/2022   Procedure: FLEXIBLE SIGMOIDOSCOPY;  Surgeon: Lesly Rubenstein, MD;  Location: [facility] ENDOSCOPY;  Service: Endoscopy;  Laterality: N/A;  Patient requests anesthesia   IR [place]  ADDITIONAL VESSEL  02/07/2022   IR ANGIOGRAM SELECTIVE EACH ADDITIONAL VESSEL  02/07/2022   IR ANGIOGRAM VISCERAL SELECTIVE  02/07/2022   IR EMBO ARTERIAL NOT HEMORR HEMANG INC GUIDE ROADMAPPING  02/07/2022   IR PARACENTESIS  12/17/2021   IR PARACENTESIS  03/25/2022   IR RADIOLOGIST EVAL & MGMT  03/18/2022   IR RADIOLOGIST EVAL & MGMT   05/07/2022   IR RADIOLOGIST EVAL & MGMT  05/16/2022   IR US GUIDE VASC ACCESS RIGHT  02/07/2022   JOINT REPLACEMENT     KNEE SURGERY Right 09/24/2017   plates and pins   TOTAL SHOULDER ARTHROPLASTY Right 09/27/2015      Allergies: Gramineae pollens and Latex  Medications: Prior to Admission medications   Medication Sig Start Date End Date Taking? Authorizing Provider  acetaminophen (TYLENOL) 650 MG CR tablet Take 1,300 mg by mouth daily as needed for pain.    [provider]  albuterol (VENTOLIN HFA) 108 (90 Base) MCG/ACT inhaler Inhale 2 puffs into the lungs every 6 (six) hours as needed for wheezing or shortness of breath. 02/10/22   Loletha Grayer, MD  amoxicillin (AMOXIL) 500 MG capsule SMARTSIG:4 Capsule(s) By Mouth Once 05/28/22   [provider]  atorvastatin (LIPITOR) 10 MG tablet Take 10 mg by mouth daily. 01/28/21   [provider]  diphenhydrAMINE (BENADRYL) 25 MG tablet Take 25 mg by mouth 2 (two) times daily as needed for itching.    [provider]  ergocalciferol (VITAMIN D2) 1.25 MG (50000 UT) capsule Take 1 capsule (50,000 Units total) by mouth once a week. 04/02/22   Harriett Rush, PA-C  gabapentin (NEURONTIN) 400 MG capsule Take 800 mg by mouth 2 (two) times daily. 01/07/18   [provider]  hydrocortisone (ANUSOL-HC) 2.5 % rectal cream Place rectally 4 (four) times daily. 03/07/22   Loletha Grayer, MD  lactulose, encephalopathy, (CHRONULAC) 10 GM/15ML SOLN Take 30 mLs (20 g total) by mouth daily. 01/30/22   Roxan Hockey, MD  melatonin 5 MG TABS Take 10 mg by mouth at bedtime as needed (sleep).    [provider]  Multiple Vitamins-Minerals (MULTIVITAMIN WITH MINERALS) tablet Take 1 tablet by mouth daily.    [provider]  sertraline (ZOLOFT) 50 MG tablet Take 50 mg by mouth daily. 12/12/17   [provider]  sodium chloride (OCEAN) 0.65 % SOLN nasal spray Place 1 spray into both nostrils  as needed for congestion. Patient taking differently: Place 1 spray into both nostrils at bedtime. 02/10/22   Loletha Grayer, MD  vitamin B-12 (CYANOCOBALAMIN) 1000 MCG tablet Take 1,000 mcg by mouth daily.    [provider]  witch hazel-glycerin (TUCKS) pad Apply topically as needed for hemorrhoids. 03/07/22   Loletha Grayer, MD     Vital Signs: Vitals:   06/13/22 0959  BP: (!) 118/49  Pulse: 82  Resp: 20  Temp: 98.1 F (36.7 C)  SpO2: 100%      Physical Exam awake, alert.  Chest with some slightly diminished breath sounds bases.  Heart with regular rate and rhythm.  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 Request is for therapeutic paracentesis EXAM: ULTRASOUND GUIDED THERAPEUTIC PARACENTESIS MEDICATIONS: Lidocaine 1% 10 mL COMPLICATIONS: None immediate. PROCEDURE:  Informed written consent was obtained from the patient after a discussion of the risks, benefits and alternatives to treatment. A timeout was performed prior to the initiation of the procedure. Initial ultrasound scanning demonstrates a large amount of ascites within the right lower abdominal quadrant. The right lower abdomen was prepped and draped in the usual sterile fashion. 1% lidocaine was used for local anesthesia. Following this, a 19 gauge, 10-cm, Yueh catheter was introduced. An ultrasound image was saved for documentation purposes. The paracentesis was performed. The catheter was removed and a dressing was applied. The patient tolerated the procedure well without immediate post procedural complication. FINDINGS: A total of approximately 8 L of straw-colored fluid was removed.  IMPRESSION: Successful ultrasound-guided therapeutic paracentesis yielding 8 L of peritoneal fluid. Read by: Rushie Nyhan, NP Electronically Signed   By: Michaelle Birks M.D.   On: 06/10/2022 13:25    Labs:  CBC: Recent Labs    05/21/22 1007 05/28/22 0952 06/04/22 1003 06/11/22 0936  WBC 3.4* 3.8* 3.3* 3.2*  HGB 6.5* 7.1* 6.1* 6.5*  HCT 20.8* 22.7* 20.1* 21.1*  PLT 133* 144* 134* 140*    COAGS: Recent Labs    01/28/22 1707 02/04/22 1245 03/05/22 1650 03/22/22 0206  INR 1.2 1.3* 1.2 1.3*  APTT  --  31  --  30    BMP: Recent Labs    03/26/22 0313 03/27/22 0401 04/02/22 1035 05/21/22 1007  NA 138 138 137 138  K 3.5 3.8 4.2 4.5  CL 111 112* 110 112*  CO2 23 22 23 22   GLUCOSE 160* 143* 134* 155*  BUN 25* 28* 38* 38*  CALCIUM 8.0* 8.1* 8.1* 8.3*  CREATININE 1.19* 1.20* 1.49* 1.68*  GFRNONAA 51* 51* 39* 34*    LIVER FUNCTION TESTS: Recent Labs    03/25/22 0354 03/26/22 0313 04/02/22 1035 05/21/22 1007  BILITOT 1.9* 1.7* 1.3* 2.2*  AST 37 37 46* 38  ALT 19 20 27 22   ALKPHOS 70 75 136* 102  PROT 3.9* 4.0* 5.1* 4.5*  ALBUMIN 1.9* 1.9* 2.4* 2.6*    Assessment and Plan: Pt known to IR service from multiple paracenteses, right thoracentesis on 10/01/21 and coil embolization of bilateral superior rectal arteries on 02/10/22. She has a hx of non alcoholic cirrhosis with prior TIPS 12/26/21([facility]) and revision 03/12/22, uterine cancer, DM, GERD, HTN, sleep apnea, vit B12/D deficiencies, iron def anemia and beta thalassemia intermedia. She has poor venous access and is in need of durable venous access for frequent blood transfusions. She presents today for port a cath placement. Risks and benefits of image guided port-a-catheter placement was discussed with the patient including, but not limited to bleeding, infection, pneumothorax, or fibrin sheath development and need for additional procedures.  All of the patient's questions were answered, patient is agreeable to  proceed. Consent signed and in chart.    Electronically Signed: D. Rowe Robert, PA-C 06/12/2022, 3:13 PM   I spent a total of 25 minutes at the the patient's bedside AND on the patient's hospital floor or unit, greater than 50% of which was counseling/coordinating care for port a cath placement

## 2022-06-12 NOTE — Patient Instructions (Signed)
[name]  Discharge Instructions: Thank you for choosing [facility] to provide your oncology and hematology care.  If you have a lab appointment with the [facility], please come in thru the Main Entrance and check in at the main information desk.  Wear comfortable clothing and clothing appropriate for easy access to any Portacath or PICC line.   We strive to give you quality time with your provider. You may need to reschedule your appointment if you arrive late (15 or more minutes).  Arriving late affects you and other patients whose appointments are after yours.  Also, if you miss three or more appointments without notifying the office, you may be dismissed from the clinic at the provider's discretion.      For prescription refill requests, have your pharmacy contact our office and allow 72 hours for refills to be completed.    Today you received 2 units of blood.    BELOW ARE SYMPTOMS THAT SHOULD BE REPORTED IMMEDIATELY: *FEVER GREATER THAN 100.4 F (38 C) OR HIGHER *CHILLS OR SWEATING *NAUSEA AND VOMITING THAT IS NOT CONTROLLED WITH YOUR NAUSEA MEDICATION *UNUSUAL SHORTNESS OF BREATH *UNUSUAL BRUISING OR BLEEDING *URINARY PROBLEMS (pain or burning when urinating, or frequent urination) *BOWEL PROBLEMS (unusual diarrhea, constipation, pain near the anus) TENDERNESS IN MOUTH AND THROAT WITH OR WITHOUT PRESENCE OF ULCERS (sore throat, sores in mouth, or a toothache) UNUSUAL RASH, SWELLING OR PAIN  UNUSUAL VAGINAL DISCHARGE OR ITCHING   Items with * indicate a potential emergency and should be followed up as soon as possible or go to the Emergency Department if any problems should occur.  Please show the CHEMOTHERAPY ALERT CARD or IMMUNOTHERAPY ALERT CARD at check-in to the Emergency Department and triage nurse.  Should you have questions after your visit or need to cancel or reschedule your appointment, please contact [place] [phone]  and follow the prompts.  Office hours are 8:00 a.m. to 4:30 p.m. Monday - Friday. Please note that voicemails left after 4:00 p.m. may not be returned until the following business day.  We are closed weekends and major holidays. You have access to a nurse at all times for urgent questions. Please call the main number to the clinic [phone] and follow the prompts.  For any non-urgent questions, you may also contact your provider using MyChart. We now offer e-Visits for anyone 40 and older to request care online for non-urgent symptoms. For details visit [email or web address].   Also download the MyChart app! Go to the app store, search "MyChart", open the app, select [place], and log in with your MyChart username and password.  Masks are optional in the cancer centers. If you would like for your care team to wear a mask while they are taking care of you, please let them know. You may have one support person who is at least 64 years old accompany you for your appointments.

## 2022-06-12 NOTE — Progress Notes (Signed)
Pt presents today for 2 units of blood per provider's order. Vitals stable and pt voiced no new complaints at this time.  First unit of blood started at 0900, increased rate 282m/hr and volume was increased to 285 mL at 0915. Blood finished at 1038 a.m.  2 units of blood given today per MD orders. Tolerated infusion without adverse affects. Vital signs stable. No complaints at this time. Discharged from clinic via wheelchair in stable condition. Alert and oriented x 3. [facility]

## 2022-06-13 ENCOUNTER — Ambulatory Visit (HOSPITAL_COMMUNITY)
Admission: RE | Admit: 2022-06-13 | Discharge: 2022-06-13 | Disposition: A | Discharge status: Home / Self Care | Payer: 59 | Source: Ambulatory Visit | Attending: Hematology | Admitting: Hematology

## 2022-06-13 ENCOUNTER — Encounter (HOSPITAL_COMMUNITY): Payer: 59 | Admitting: Physical Therapy

## 2022-06-13 ENCOUNTER — Other Ambulatory Visit: Payer: Self-pay

## 2022-06-13 ENCOUNTER — Encounter (HOSPITAL_COMMUNITY): Payer: Self-pay

## 2022-06-13 ENCOUNTER — Telehealth: Payer: Self-pay | Admitting: *Deleted

## 2022-06-13 DIAGNOSIS — Z8542 Personal history of malignant neoplasm of other parts of uterus: Secondary | ICD-10-CM | POA: Diagnosis not present

## 2022-06-13 DIAGNOSIS — G4730 Sleep apnea, unspecified: Secondary | ICD-10-CM | POA: Insufficient documentation

## 2022-06-13 DIAGNOSIS — K7460 Unspecified cirrhosis of liver: Secondary | ICD-10-CM | POA: Diagnosis not present

## 2022-06-13 DIAGNOSIS — D561 Beta thalassemia: Secondary | ICD-10-CM | POA: Diagnosis not present

## 2022-06-13 DIAGNOSIS — K219 Gastro-esophageal reflux disease without esophagitis: Secondary | ICD-10-CM | POA: Diagnosis not present

## 2022-06-13 DIAGNOSIS — D649 Anemia, unspecified: Secondary | ICD-10-CM | POA: Insufficient documentation

## 2022-06-13 DIAGNOSIS — I10 Essential (primary) hypertension: Secondary | ICD-10-CM | POA: Insufficient documentation

## 2022-06-13 DIAGNOSIS — Z452 Encounter for adjustment and management of vascular access device: Secondary | ICD-10-CM | POA: Diagnosis not present

## 2022-06-13 HISTORY — PX: IR IMAGING GUIDED PORT INSERTION: IMG5740

## 2022-06-13 LAB — TYPE AND SCREEN
ABO/RH(D): A POS
Antibody Screen: NEGATIVE
Unit division: 0
Unit division: 0

## 2022-06-13 LAB — BPAM RBC
Blood Product Expiration Date: 202311192359
Blood Product Expiration Date: 202311262359
ISSUE DATE / TIME: 202310260853
ISSUE DATE / TIME: 202310261042
Unit Type and Rh: 6200
Unit Type and Rh: 6200

## 2022-06-13 MED ORDER — MIDAZOLAM HCL 2 MG/2ML IJ SOLN
INTRAMUSCULAR | Status: AC
  Filled 2022-06-13: qty 2

## 2022-06-13 MED ORDER — HEPARIN SOD (PORK) LOCK FLUSH 100 UNIT/ML IV SOLN
INTRAVENOUS | Status: AC | PRN
  Administered 2022-06-13: 500 [IU] via INTRAVENOUS

## 2022-06-13 MED ORDER — SODIUM CHLORIDE 0.9 % IV SOLN: INTRAVENOUS | Status: DC

## 2022-06-13 MED ORDER — LIDOCAINE-EPINEPHRINE 1 %-1:100000 IJ SOLN
INTRAMUSCULAR | Status: AC
  Filled 2022-06-13: qty 1

## 2022-06-13 MED ORDER — LIDOCAINE HCL 1 % IJ SOLN
INTRAMUSCULAR | Status: AC | PRN
  Administered 2022-06-13: 5 mL

## 2022-06-13 MED ORDER — FENTANYL CITRATE (PF) 100 MCG/2ML IJ SOLN
INTRAMUSCULAR | Status: AC | PRN
  Administered 2022-06-13: 50 ug via INTRAVENOUS

## 2022-06-13 MED ORDER — MIDAZOLAM HCL 2 MG/2ML IJ SOLN
INTRAMUSCULAR | Status: AC | PRN
  Administered 2022-06-13: 1 mg via INTRAVENOUS

## 2022-06-13 MED ORDER — FENTANYL CITRATE (PF) 100 MCG/2ML IJ SOLN
INTRAMUSCULAR | Status: AC
  Filled 2022-06-13: qty 2

## 2022-06-13 MED ORDER — LIDOCAINE-EPINEPHRINE 1 %-1:100000 IJ SOLN
INTRAMUSCULAR | Status: AC | PRN
  Administered 2022-06-13: 20 mL

## 2022-06-13 MED ORDER — LIDOCAINE HCL 1 % IJ SOLN
INTRAMUSCULAR | Status: AC
  Filled 2022-06-13: qty 20

## 2022-06-13 MED ORDER — HEPARIN SOD (PORK) LOCK FLUSH 100 UNIT/ML IV SOLN
INTRAVENOUS | Status: AC
  Filled 2022-06-13: qty 5

## 2022-06-13 NOTE — Procedures (Signed)
Interventional Radiology Procedure:   Indications: Poor venous access  Procedure: Port placement  Findings: Right jugular port, tip at SVC/RA junction  Complications: None     EBL: Minimal, less than 10 ml  Plan: Discharge in one hour.  Keep port site and incisions dry for at least 24 hours.     Aeriel Boulay R. Anselm Pancoast, MD  Pager: [phone]

## 2022-06-13 NOTE — Chronic Care Management (AMB) (Signed)
  Care Coordination Note  06/13/2022 Name: Rachel Huber MRN: [id number] DOB: [birth year]  Rachel Huber is a 66 y.o. year old female who is a primary care patient of [name], MD and is actively engaged with the care management team. I reached out to Rachel Huber by phone today to assist with re-scheduling a follow up visit with the RN Case Manager  Follow up plan: Pt was still in short stay - will call back at a later date to reschedule  Julian Hy, [place] Direct Dial: [phone]

## 2022-06-16 ENCOUNTER — Other Ambulatory Visit: Payer: Self-pay | Admitting: Gastroenterology

## 2022-06-16 ENCOUNTER — Ambulatory Visit (HOSPITAL_COMMUNITY): Payer: 59

## 2022-06-16 DIAGNOSIS — R2689 Other abnormalities of gait and mobility: Secondary | ICD-10-CM | POA: Diagnosis not present

## 2022-06-16 DIAGNOSIS — M6281 Muscle weakness (generalized): Secondary | ICD-10-CM

## 2022-06-16 DIAGNOSIS — R262 Difficulty in walking, not elsewhere classified: Secondary | ICD-10-CM

## 2022-06-16 DIAGNOSIS — K7460 Unspecified cirrhosis of liver: Secondary | ICD-10-CM | POA: Diagnosis not present

## 2022-06-16 NOTE — Therapy (Addendum)
OUTPATIENT PHYSICAL THERAPY PROGRESS NOTE Progress Note Reporting Period 05/01/2022 to 06/16/22  See note below for Objective Data and Assessment of Progress/Goals.      Patient Name: CASIA CORTI MRN: [id number] DOB:05-22-1956, 66 y.o., female Today's Date: 06/16/2022   PT End of Session - 06/16/22 1302     Visit Number 10    Number of Visits 18    Date for PT Re-Evaluation 07/14/22    Authorization Type [employer] QH (no VL)    Progress Note Due on Visit 18    PT Start Time 1259    PT Stop Time 1340    PT Time Calculation (min) 41 min    Activity Tolerance Patient tolerated treatment well;Patient limited by pain;Patient limited by fatigue    Behavior During Therapy [facility] for tasks assessed/performed                   Past Medical History:  Diagnosis Date   Anxiety    Arthritis    Cirrhosis of liver ([place])    Diabetes mellitus without complication (HCC)    Dyspnea    DOE   GERD (gastroesophageal reflux disease)    Grade IV internal hemorrhoids    Contingency plan for any future admissions for severe anemia in the setting of persistent GI bleed:  nuclear medicine tagged RBC scan to assess for location of GI bleeding - if from rectum, would then proceed to angiogram with possible repeat embolization.  Please notify IR in this case.   Hepatitis    PAST   Hypertension    Neuropathy    Neuropathy, diabetic ([place])    Pneumonia    Sinus complaint    Sleep apnea    CPAP   Thalassemia minor 1992   Past Surgical History:  Procedure Laterality Date   ABDOMINAL HYSTERECTOMY     BREAST BIOPSY Right 02/15/2018   [place] bx 6-6:30 ribbon shape, ONE CORE FRAGMENT WITH FIBROSIS. ONE CORE FRAGMENT WITH PORTION OF A DILATED   BREAST BIOPSY Right 02/15/2018   [place] bx 9:00 heart shape, USUAL DUCTAL HYPERPLASIA   BREAST LUMPECTOMY Right 03/09/2018   Procedure: BREAST LUMPECTOMY x 2;  Surgeon: Benjamine Sprague, DO;  Location: [facility] ORS;  Service: General;  Laterality: Right;    CATARACT EXTRACTION W/PHACO Left 06/11/2016   Procedure: CATARACT EXTRACTION PHACO AND INTRAOCULAR LENS PLACEMENT (IOC);  Surgeon: Estill Cotta, MD;  Location: [facility] ORS;  Service: Ophthalmology;  Laterality: Left;  Lot # [id number] H US:01:38.6 AP%:26.4 CDE:44.15   CATARACT EXTRACTION W/PHACO Right 06/15/2018   Procedure: CATARACT EXTRACTION PHACO AND INTRAOCULAR LENS PLACEMENT (IOC);  Surgeon: Birder Robson, MD;  Location: [facility] ORS;  Service: Ophthalmology;  Laterality: Right;  [place] 00:38.2 CDE 4.23 Fluid Pack Lot # [id number]   COLONOSCOPIES     COLONOSCOPY WITH PROPOFOL N/A 10/10/2020   Procedure: COLONOSCOPY WITH PROPOFOL;  Surgeon: Lesly Rubenstein, MD;  Location: [facility] END[facility]COPY;  Service: Endoscopy;  Laterality: N/A;   COLONOSCOPY WITH PROPOFOL N/A 11/20/2020   Procedure: COLONOSCOPY WITH PROPOFOL;  Surgeon: Lesly Rubenstein, MD;  Location: [facility] ENDOSCOPY;  Service: Endoscopy;  Laterality: N/A;  DM STAT CBC, BMP COVID POSITIVE 09/02/2020   CTR     ESOPHAGOGASTRODUODENOSCOPY (EGD) WITH PROPOFOL N/A 10/10/2020   Procedure: ESOPHAGOGASTRODUODENOSCOPY (EGD) WITH PROPOFOL;  Surgeon: Lesly Rubenstein, MD;  Location: [facility] ENDOSCOPY;  Service: Endoscopy;  Laterality: N/A;  COVID POSITIVE 10/08/2020   FLEXIBLE SIGMOIDOSCOPY N/A 02/06/2022   Procedure: FLEXIBLE SIGMOIDOSCOPY;  Surgeon: Lesly Rubenstein, MD;  Location: [facility] ENDOSCOPY;  Service: Endoscopy;  Laterality: N/A;  Patient requests anesthesia   IR ANGIOGRAM SELECTIVE EACH ADDITIONAL VESSEL  02/07/2022   IR ANGIOGRAM SELECTIVE EACH ADDITIONAL VESSEL  02/07/2022   IR ANGIOGRAM VISCERAL SELECTIVE  02/07/2022   IR EMBO ARTERIAL NOT HEMORR HEMANG INC GUIDE ROADMAPPING  02/07/2022   IR IMAGING GUIDED PORT INSERTION  06/13/2022   IR PARACENTESIS  12/17/2021   IR PARACENTESIS  03/25/2022   IR RADIOLOGIST EVAL & MGMT  03/18/2022   IR RADIOLOGIST EVAL & MGMT  05/07/2022   IR RADIOLOGIST EVAL & MGMT  05/16/2022   IR US GUIDE VASC ACCESS RIGHT   02/07/2022   JOINT REPLACEMENT     KNEE SURGERY Right 09/24/2017   plates and pins   TOTAL SHOULDER ARTHROPLASTY Right 09/27/2015   Patient Active Problem List   Diagnosis Date Noted   Grade IV internal hemorrhoids 05/16/2022   Acute blood loss anemia 03/20/2022   Acute on chronic blood loss anemia 03/05/2022   Thrombocytopenia ([place]) 03/05/2022   Fever 02/08/2022   Pancytopenia ([place]) 02/07/2022   GI bleeding 02/04/2022   Chronic kidney disease, stage 3b ([place]) 02/04/2022   Depression with anxiety 02/04/2022   Liver cirrhosis secondary to NASH ([place])    Acute GI hemorrhage 01/28/2022   AKI (acute kidney injury) ([place]) 01/27/2022   Insomnia 01/27/2022   Iron deficiency anemia due to chronic blood loss 02/13/2021   History of uterine cancer 02/13/2021   Decompensated hepatic cirrhosis ([place]) 10/25/2020   OSA on CPAP 10/25/2020   Glossitis 10/25/2020   Hyponatremia 10/08/2020   Acute respiratory failure with hypoxia ([place]) 09/10/2020   Pneumonia due to COVID-19 virus 09/08/2020   Hip fracture, right ([place]) 09/04/2020   Closed right hip fracture ([place]) 09/02/2020   Type 2 diabetes mellitus with hyperlipidemia ([place]) 09/02/2020   Thalassemia minor    HLD (hyperlipidemia)    Acute kidney injury superimposed on CKD ([place])    Depression    Fall at home, initial encounter    Nondisplaced fracture of greater trochanter of right femur, initial encounter for closed fracture ([place])     PCP: Frazier Richards MD  REFERRING PROVIDER: Tinnie Gens, NP   REFERRING DIAG: PT eval/tx for strength and conditioning   THERAPY DIAG:  Muscle weakness (generalized) - Plan: PT plan of care cert/re-cert  Difficulty in walking, not elsewhere classified - Plan: PT plan of care cert/re-cert  Other abnormalities of gait and mobility - Plan: PT plan of care cert/re-cert  Cirrhosis, non-alcoholic ([place]) - Plan: PT plan of care cert/re-cert  Rationale for Evaluation and Treatment  Rehabilitation  ONSET DATE: Chronic  SUBJECTIVE:   SUBJECTIVE STATEMENT:  Patient reports she is very swollen today as she has paracentesis every Tuesday and so she is quite full of fluid today; had a port placed Friday to help with blood draws as she has to give blood frequently.  PERTINENT HISTORY: Paracentesis each week (Tuesdays) with 8L of removal Falls, weakness,DM, R shoulder reverse replacement an Feb 2019 R knee pain ([place])   PAIN:  Are you having pain? Yes: NPRS scale: 5/10 Pain location: knees BLE; feel tight Pain description: aching  Aggravating factors: use, movement, WB Relieving factors: rest, meds, non WB  PRECAUTIONS: Fall  WEIGHT BEARING RESTRICTIONS No  FALLS:  Has patient fallen in last 6 months? Yes. Number of falls 1  LIVING ENVIRONMENT: Lives with: lives with their family and lives with their spouse Lives in: House/apartment Stairs: No Has following equipment  at home: Single point cane and Walker - 2 wheeled  OCCUPATION: Retired/ Disability   PLOF: Needs assistance with ADLs  PATIENT GOALS  Get to point of not having to use cane/ improve balance    OBJECTIVE:   DIAGNOSTIC FINDINGS: NA   COGNITION:  Overall cognitive status: Within functional limits for tasks assessed     SENSATION: [facility]   LOWER EXTREMITY MMT:  MMT Right eval Left eval Right 06/16/22 Left 06/16/22  Hip flexion 4 4 4- 4  Hip extension      Hip abduction      Hip adduction      Hip internal rotation      Hip external rotation      Knee flexion 4 4    Knee extension 4 4 4+ 4+  Ankle dorsiflexion 4 4 4+ 4+  Ankle plantarflexion      Ankle inversion      Ankle eversion       (Blank rows = not tested)  FUNCTIONAL TESTS:  5 times sit to stand: 28.5 second with use of hands  2 minute walk test: 170 feet  GAIT: Distance walked: 170 feet Assistive device utilized: Walker - 2 wheeled Level of assistance: Modified independence and SBA Comments: flexed trunk,  decreased stride  TODAY'S TREATMENT: 06/16/22 5 x sit to stand 56 sec using hands; a couple times needs more than one attempt to stand 5 x sit to stand 2nd attempt; 33 sec using hands to assist up but improved form Progress note 2 MWT unable today Seated  Heel/toe raises x 20 LAQ's 2" hold 2# x 20 each Marching 2#  x 20`    10.23.23 O2 sats monitored Seated:  LAQ 10X3" each (84-92%)  STS from standard chair with black foam no UE 10X (84% after completing 5/rest and complete 5X more after return to 92%)  Standing:  with UE assist   Alternating marching 2X10   Hip abduction 2X10   Minisquats 10X  06/06/22 Standing: Heel raises 2 x 10 Hip abduction 2 x 10 each Sidestepping table length x 3 down and back Mini squats x 10 Tandem stance 2 x 20" each  05/30/22  2MWT 135f with RW  STS 10x elevated height for no HHA needed  Heel raises  Toe raises  Marching  Squat  Educated on benefits of compression garments and use of butler to assist with donning. Measurements taken and given handout for ETI.  05/26/22 Nustep (seat 12) lv 2 4 min dynamic warmup  Step up 6 inch x 10 HHA x 2 Clinic ambulation 170 feet with RW  Sit to stand x10   05/23/22            Nustep hills 3 x 6' level 3            Step up 6" x 10 B            Marching x 15             Minisquat x 10             Side step at // with green theraband  x 3 RT            Sit to stand x 10 with green theraband      05/19/22                        Standing:  Heel raises  20X  Toeraises 20X Marching 10X 2 sets 1 HHA with intermittent 2HHA                        Mini squat 10X 2 sets                         Side step at // x 2 RT    Sit to stand x10 no UE's from standard chair, no UE    Ambulation around clinic with RW for activity tolerance X150 feet without rest                     05/16/22                        Standing:                        Heel raises x 10                         Marching x 10                         Mini squat x 10                         Side step at // x 2 RT                         Sit to stand x10   PATIENT EDUCATION:  Education details: on eval findings, POC and HEP  Person educated: Patient Education method: Explanation Education comprehension: verbalized understanding   HOME EXERCISE PROGRAM:            05/30/22:  Begin walking program with RW  04/19/22 - Heel Raises with Counter Support  - 2 x daily - 7 x weekly - 1 sets - 10 reps - 3-5" hold - Mini Squat with Counter Support  - 2 x daily - 7 x weekly - 1 sets - 10 reps - 3-5" hold - Sit to Stand with Counter Support  - 2 x daily - 7 x weekly - 1 sets - 10 reps9/              Access Code: [email or web address] URL: [email or web address] Date: 05/01/2022 Prepared by: Josue Hector  Exercises - Seated Heel Toe Raises  - 2 x daily - 7 x weekly - 1 sets - 10 reps - Seated Long Arc Quad  - 2 x daily - 7 x weekly - 1 sets - 10 reps - Seated March  - 2 x daily - 7 x weekly - 1 sets - 10 reps - Supine Active Straight Leg Raise  - 7 x weekly - 1 sets - 10 reps  ASSESSMENT:  CLINICAL IMPRESSION: Progress note today. Patient legs are quite tight and swollen with fluid today; scheduled for regular paracentesis tomorrow. Noted more jaundice color skin today. Patient with good distal strength today but with her holding so much fluid than its hard for her to lift her hips for MMT's and decline in score of 5 times sit to stand test.  Legs are so tight that she is not able to perform 2 MWT today.  Overall her fluid retention is affecting her mobility more than any other issue. Would expect  her functional tests to improve after fluid removal/paracentesis.  Patient will benefit from continued skilled therapy services  to address deficits and promote return to optimal function.     OBJECTIVE IMPAIRMENTS Abnormal gait, decreased activity tolerance, decreased balance, decreased mobility, difficulty  walking, decreased ROM, decreased strength, improper body mechanics, and pain.   ACTIVITY LIMITATIONS lifting, bending, standing, squatting, stairs, transfers, and locomotion level  PARTICIPATION LIMITATIONS: meal prep, cleaning, laundry, shopping, community activity, and yard work  PERSONAL FACTORS Past/current experiences and Time since onset of injury/illness/exacerbation are also affecting patient's functional outcome.   REHAB POTENTIAL: Good  CLINICAL DECISION MAKING: Stable/uncomplicated  EVALUATION COMPLEXITY: Low   GOALS: SHORT TERM GOALS: Target date: 05/29/2022  Patient will be independent with initial HEP and self-management strategies to improve functional outcomes Baseline:  Goal status: IN PROGRESS  2. Patient will be able to stand, with minimal support, at least 5 minutes for improved ability to perform meal preparation/ cooking/ grooming/ cleaning ADLs.  Baseline: 2.5 minutes Goal status: IN PROGRESS  LONG TERM GOALS: Target date: 07/14/2022  Patient will be independent with advanced HEP and self-management strategies to improve functional outcomes Baseline:  Goal status: IN PROGRESS  2.  Patient will report at least 65% overall improvement in subjective complaint to indicate improvement in ability to perform ADLs. Baseline:  Goal status: IN PROGRESS  3.  Patient will be able to stand, with minimal support, at least 10 minutes for improved ability to perform meal preparation/ cooking/ grooming/ cleaning ADLs.  Baseline: 2.5 minutes Goal status: IN PROGRESS  4. Patient will have equal to or > 4+/5 MMT throughout BLE to improve ability to perform functional mobility, stair ambulation and ADLs.  Baseline: See MMT Goal status: IN PROGRESS  5. Patient will be able to ambulate at least 300 feet during 2MWT with LRAD to demonstrate improved ability to perform functional mobility and associated tasks. Baseline: 170 feet Goal status: IN PROGRESS  PLAN: PT  FREQUENCY: 2x/week  PT DURATION: 4 more weeks  PLANNED INTERVENTIONS: Therapeutic exercises, Therapeutic activity, Neuromuscular re-education, Balance training, Gait training, Patient/Family education, Joint manipulation, Joint mobilization, Stair training, Aquatic Therapy, Dry Needling, Electrical stimulation, Spinal manipulation, Spinal mobilization, Cryotherapy, Moist heat, scar mobilization, Taping, Traction, Ultrasound, Biofeedback, Ionotophoresis 86m/ml Dexamethasone, and Manual therapy.  PLAN FOR NEXT SESSION: Progress functional LE strength, work on ambulation tolerance. Extend 2 week 4  2:00 PM, 06/16/22 Luiscarlos Kaczmarczyk Small Caitrin Pendergraph MPT [facility] [place] #[phone]

## 2022-06-17 ENCOUNTER — Inpatient Hospital Stay
Admission: EM | Admit: 2022-06-17 | Discharge: 2022-06-20 | DRG: 378 | Disposition: A | Discharge status: Home / Self Care | Payer: 59 | Attending: Internal Medicine | Admitting: Internal Medicine

## 2022-06-17 ENCOUNTER — Emergency Department: Payer: 59

## 2022-06-17 ENCOUNTER — Ambulatory Visit
Admission: RE | Admit: 2022-06-17 | Discharge: 2022-06-17 | Disposition: A | Discharge status: Home / Self Care | Payer: 59 | Source: Ambulatory Visit | Attending: Gastroenterology | Admitting: Gastroenterology

## 2022-06-17 ENCOUNTER — Other Ambulatory Visit: Payer: Self-pay

## 2022-06-17 VITALS — BP 118/48 | HR 89 | Temp 98.4°F | Resp 20

## 2022-06-17 DIAGNOSIS — K7469 Other cirrhosis of liver: Secondary | ICD-10-CM | POA: Diagnosis not present

## 2022-06-17 DIAGNOSIS — D62 Acute posthemorrhagic anemia: Secondary | ICD-10-CM | POA: Diagnosis present

## 2022-06-17 DIAGNOSIS — K922 Gastrointestinal hemorrhage, unspecified: Secondary | ICD-10-CM | POA: Diagnosis present

## 2022-06-17 DIAGNOSIS — I700 Atherosclerosis of aorta: Secondary | ICD-10-CM | POA: Diagnosis present

## 2022-06-17 DIAGNOSIS — K7581 Nonalcoholic steatohepatitis (NASH): Secondary | ICD-10-CM | POA: Diagnosis present

## 2022-06-17 DIAGNOSIS — K643 Fourth degree hemorrhoids: Secondary | ICD-10-CM | POA: Diagnosis present

## 2022-06-17 DIAGNOSIS — Z96611 Presence of right artificial shoulder joint: Secondary | ICD-10-CM | POA: Diagnosis present

## 2022-06-17 DIAGNOSIS — F418 Other specified anxiety disorders: Secondary | ICD-10-CM | POA: Diagnosis present

## 2022-06-17 DIAGNOSIS — K766 Portal hypertension: Secondary | ICD-10-CM | POA: Diagnosis present

## 2022-06-17 DIAGNOSIS — K625 Hemorrhage of anus and rectum: Secondary | ICD-10-CM | POA: Insufficient documentation

## 2022-06-17 DIAGNOSIS — R188 Other ascites: Secondary | ICD-10-CM | POA: Diagnosis not present

## 2022-06-17 DIAGNOSIS — K7460 Unspecified cirrhosis of liver: Secondary | ICD-10-CM | POA: Diagnosis present

## 2022-06-17 DIAGNOSIS — R180 Malignant ascites: Secondary | ICD-10-CM | POA: Diagnosis present

## 2022-06-17 DIAGNOSIS — E1122 Type 2 diabetes mellitus with diabetic chronic kidney disease: Secondary | ICD-10-CM | POA: Diagnosis present

## 2022-06-17 DIAGNOSIS — Z833 Family history of diabetes mellitus: Secondary | ICD-10-CM

## 2022-06-17 DIAGNOSIS — K921 Melena: Secondary | ICD-10-CM | POA: Diagnosis not present

## 2022-06-17 DIAGNOSIS — E1140 Type 2 diabetes mellitus with diabetic neuropathy, unspecified: Secondary | ICD-10-CM | POA: Diagnosis present

## 2022-06-17 DIAGNOSIS — Z807 Family history of other malignant neoplasms of lymphoid, hematopoietic and related tissues: Secondary | ICD-10-CM

## 2022-06-17 DIAGNOSIS — K644 Residual hemorrhoidal skin tags: Secondary | ICD-10-CM | POA: Diagnosis present

## 2022-06-17 DIAGNOSIS — K219 Gastro-esophageal reflux disease without esophagitis: Secondary | ICD-10-CM | POA: Diagnosis present

## 2022-06-17 DIAGNOSIS — Z8616 Personal history of COVID-19: Secondary | ICD-10-CM

## 2022-06-17 DIAGNOSIS — Z6835 Body mass index (BMI) 35.0-35.9, adult: Secondary | ICD-10-CM

## 2022-06-17 DIAGNOSIS — G4733 Obstructive sleep apnea (adult) (pediatric): Secondary | ICD-10-CM

## 2022-06-17 DIAGNOSIS — K8020 Calculus of gallbladder without cholecystitis without obstruction: Secondary | ICD-10-CM | POA: Diagnosis not present

## 2022-06-17 DIAGNOSIS — Z8249 Family history of ischemic heart disease and other diseases of the circulatory system: Secondary | ICD-10-CM

## 2022-06-17 DIAGNOSIS — Z79899 Other long term (current) drug therapy: Secondary | ICD-10-CM

## 2022-06-17 DIAGNOSIS — K5730 Diverticulosis of large intestine without perforation or abscess without bleeding: Secondary | ICD-10-CM | POA: Diagnosis not present

## 2022-06-17 DIAGNOSIS — E669 Obesity, unspecified: Secondary | ICD-10-CM | POA: Diagnosis present

## 2022-06-17 DIAGNOSIS — D563 Thalassemia minor: Secondary | ICD-10-CM | POA: Diagnosis present

## 2022-06-17 DIAGNOSIS — I701 Atherosclerosis of renal artery: Secondary | ICD-10-CM | POA: Diagnosis not present

## 2022-06-17 DIAGNOSIS — Z90710 Acquired absence of both cervix and uterus: Secondary | ICD-10-CM

## 2022-06-17 DIAGNOSIS — Z7682 Awaiting organ transplant status: Secondary | ICD-10-CM

## 2022-06-17 DIAGNOSIS — R000 Tachycardia, unspecified: Secondary | ICD-10-CM | POA: Diagnosis not present

## 2022-06-17 DIAGNOSIS — I129 Hypertensive chronic kidney disease with stage 1 through stage 4 chronic kidney disease, or unspecified chronic kidney disease: Secondary | ICD-10-CM | POA: Diagnosis present

## 2022-06-17 DIAGNOSIS — R161 Splenomegaly, not elsewhere classified: Secondary | ICD-10-CM | POA: Diagnosis present

## 2022-06-17 DIAGNOSIS — Z8051 Family history of malignant neoplasm of kidney: Secondary | ICD-10-CM

## 2022-06-17 DIAGNOSIS — Z95828 Presence of other vascular implants and grafts: Secondary | ICD-10-CM

## 2022-06-17 DIAGNOSIS — N1832 Chronic kidney disease, stage 3b: Secondary | ICD-10-CM | POA: Diagnosis present

## 2022-06-17 DIAGNOSIS — N183 Chronic kidney disease, stage 3 unspecified: Secondary | ICD-10-CM | POA: Diagnosis present

## 2022-06-17 DIAGNOSIS — Z803 Family history of malignant neoplasm of breast: Secondary | ICD-10-CM

## 2022-06-17 LAB — COMPREHENSIVE METABOLIC PANEL
ALT: 18 U/L (ref 0–44)
AST: 32 U/L (ref 15–41)
Albumin: 2.5 g/dL — ABNORMAL LOW (ref 3.5–5.0)
Alkaline Phosphatase: 96 U/L (ref 38–126)
Anion gap: 5 (ref 5–15)
BUN: 39 mg/dL — ABNORMAL HIGH (ref 8–23)
CO2: 20 mmol/L — ABNORMAL LOW (ref 22–32)
Calcium: 7.8 mg/dL — ABNORMAL LOW (ref 8.9–10.3)
Chloride: 112 mmol/L — ABNORMAL HIGH (ref 98–111)
Creatinine, Ser: 1.73 mg/dL — ABNORMAL HIGH (ref 0.44–1.00)
GFR, Estimated: 33 mL/min — ABNORMAL LOW (ref >60)
Glucose, Bld: 196 mg/dL — ABNORMAL HIGH (ref 70–99)
Potassium: 4.6 mmol/L (ref 3.5–5.1)
Sodium: 137 mmol/L (ref 135–145)
Total Bilirubin: 1.9 mg/dL — ABNORMAL HIGH (ref 0.3–1.2)
Total Protein: 4.2 g/dL — ABNORMAL LOW (ref 6.5–8.1)

## 2022-06-17 LAB — CBC
HCT: 17.3 % — ABNORMAL LOW (ref 36.0–46.0)
Hemoglobin: 5.2 g/dL — ABNORMAL LOW (ref 12.0–15.0)
MCH: 24.3 pg — ABNORMAL LOW (ref 26.0–34.0)
MCHC: 30.1 g/dL (ref 30.0–36.0)
MCV: 80.8 fL (ref 80.0–100.0)
Platelets: 146 10*3/uL — ABNORMAL LOW (ref 150–400)
RBC: 2.14 MIL/uL — ABNORMAL LOW (ref 3.87–5.11)
RDW: 21.1 % — ABNORMAL HIGH (ref 11.5–15.5)
WBC: 5.2 10*3/uL (ref 4.0–10.5)
nRBC: 0.4 % — ABNORMAL HIGH (ref 0.0–0.2)

## 2022-06-17 LAB — PREPARE RBC (CROSSMATCH)

## 2022-06-17 MED ORDER — ATORVASTATIN CALCIUM 20 MG PO TABS
10.0000 mg | ORAL_TABLET | Freq: Every day | ORAL | Status: DC
  Administered 2022-06-18 – 2022-06-20 (×2): 10 mg via ORAL
  Filled 2022-06-17 (×2): qty 1

## 2022-06-17 MED ORDER — SODIUM CHLORIDE 0.9 % IV SOLN
10.0000 mL/h | Freq: Once | INTRAVENOUS | Status: AC
  Administered 2022-06-19: 10 mL/h via INTRAVENOUS

## 2022-06-17 MED ORDER — MELATONIN 5 MG PO TABS: 10.0000 mg | ORAL_TABLET | Freq: Every evening | ORAL | Status: DC | PRN

## 2022-06-17 MED ORDER — SODIUM CHLORIDE 0.9% IV SOLUTION
Freq: Once | INTRAVENOUS | Status: AC
  Filled 2022-06-17: qty 250

## 2022-06-17 MED ORDER — SODIUM CHLORIDE 0.9 % IV SOLN: 250.0000 mL | INTRAVENOUS | Status: DC | PRN

## 2022-06-17 MED ORDER — DULOXETINE HCL 30 MG PO CPEP: 30.0000 mg | ORAL_CAPSULE | Freq: Every day | ORAL | Status: DC

## 2022-06-17 MED ORDER — ALBUMIN HUMAN 25 % IV SOLN
INTRAVENOUS | Status: AC
  Administered 2022-06-17: 25 g via INTRAVENOUS
  Filled 2022-06-17: qty 100

## 2022-06-17 MED ORDER — ONDANSETRON HCL 4 MG PO TABS: 4.0000 mg | ORAL_TABLET | Freq: Four times a day (QID) | ORAL | Status: DC | PRN

## 2022-06-17 MED ORDER — LIDOCAINE HCL (PF) 1 % IJ SOLN
10.0000 mL | Freq: Once | INTRAMUSCULAR | Status: AC
  Administered 2022-06-17: 10 mL via INTRADERMAL

## 2022-06-17 MED ORDER — ALBUTEROL SULFATE (2.5 MG/3ML) 0.083% IN NEBU: 3.0000 mL | INHALATION_SOLUTION | Freq: Four times a day (QID) | RESPIRATORY_TRACT | Status: DC | PRN

## 2022-06-17 MED ORDER — ACETAMINOPHEN ER 650 MG PO TBCR: 1300.0000 mg | EXTENDED_RELEASE_TABLET | Freq: Every day | ORAL | Status: DC | PRN

## 2022-06-17 MED ORDER — IOHEXOL 350 MG/ML SOLN
75.0000 mL | Freq: Once | INTRAVENOUS | Status: AC | PRN
  Administered 2022-06-17: 75 mL via INTRAVENOUS

## 2022-06-17 MED ORDER — SODIUM CHLORIDE 0.9 % IV BOLUS
250.0000 mL | Freq: Once | INTRAVENOUS | Status: AC
  Administered 2022-06-17: 250 mL via INTRAVENOUS

## 2022-06-17 MED ORDER — VITAMIN B-12 1000 MCG PO TABS
1000.0000 ug | ORAL_TABLET | Freq: Every day | ORAL | Status: DC
  Administered 2022-06-18 – 2022-06-20 (×2): 1000 ug via ORAL
  Filled 2022-06-17: qty 2
  Filled 2022-06-17: qty 1

## 2022-06-17 MED ORDER — GABAPENTIN 600 MG PO TABS: 300.0000 mg | ORAL_TABLET | Freq: Three times a day (TID) | ORAL | Status: DC

## 2022-06-17 MED ORDER — ACETAMINOPHEN 325 MG PO TABS
650.0000 mg | ORAL_TABLET | Freq: Four times a day (QID) | ORAL | Status: DC | PRN
  Administered 2022-06-17 – 2022-06-20 (×2): 650 mg via ORAL
  Filled 2022-06-17 (×2): qty 2

## 2022-06-17 MED ORDER — VITAMIN D (ERGOCALCIFEROL) 1.25 MG (50000 UNIT) PO CAPS: 50000.0000 [IU] | ORAL_CAPSULE | ORAL | Status: DC

## 2022-06-17 MED ORDER — ADULT MULTIVITAMIN W/MINERALS CH
1.0000 | ORAL_TABLET | Freq: Every day | ORAL | Status: DC
  Administered 2022-06-18 – 2022-06-20 (×2): 1 via ORAL
  Filled 2022-06-17 (×2): qty 1

## 2022-06-17 MED ORDER — ONDANSETRON HCL 4 MG/2ML IJ SOLN: 4.0000 mg | Freq: Four times a day (QID) | INTRAMUSCULAR | Status: DC | PRN

## 2022-06-17 MED ORDER — SERTRALINE HCL 50 MG PO TABS
50.0000 mg | ORAL_TABLET | Freq: Every day | ORAL | Status: DC
  Administered 2022-06-18 – 2022-06-20 (×2): 50 mg via ORAL
  Filled 2022-06-17 (×2): qty 1

## 2022-06-17 MED ORDER — ALBUMIN HUMAN 25 % IV SOLN: 25.0000 g | Freq: Once | INTRAVENOUS | Status: AC

## 2022-06-17 MED ORDER — LACTULOSE 10 GM/15ML PO SOLN
20.0000 g | Freq: Every day | ORAL | Status: DC
  Filled 2022-06-17 (×2): qty 30

## 2022-06-17 MED ORDER — SODIUM CHLORIDE 0.9% FLUSH: 3.0000 mL | INTRAVENOUS | Status: DC | PRN

## 2022-06-17 MED ORDER — SODIUM CHLORIDE 0.9% FLUSH
3.0000 mL | Freq: Two times a day (BID) | INTRAVENOUS | Status: DC
  Administered 2022-06-18 – 2022-06-19 (×4): 3 mL via INTRAVENOUS

## 2022-06-17 MED ORDER — GABAPENTIN 300 MG PO CAPS
800.0000 mg | ORAL_CAPSULE | Freq: Two times a day (BID) | ORAL | Status: DC
  Administered 2022-06-18 – 2022-06-20 (×4): 800 mg via ORAL
  Filled 2022-06-17 (×5): qty 2

## 2022-06-17 MED ORDER — ACETAMINOPHEN 650 MG RE SUPP: 650.0000 mg | Freq: Four times a day (QID) | RECTAL | Status: DC | PRN

## 2022-06-17 NOTE — ED Triage Notes (Addendum)
Pt here from [place] with a low hemoglobin of 5.1 and rectal bleeding this morning, bright red in color. Pt denies abd pain and blood thinner. Pt currently has Albumin infusing to her chest port.

## 2022-06-17 NOTE — ED Notes (Signed)
Pt appears asleep in bed, respirations even and unlabored. VS reassessed and stable at this time. Awakens easily to voice, expresses no concerns at this time.

## 2022-06-17 NOTE — Procedures (Signed)
PROCEDURE SUMMARY:  Successful ultrasound guided paracentesis from the right upper  quadrant.  Yielded 8 of straw fluid.  No immediate complications.  The patient tolerated the procedure well.    EBL < 71m

## 2022-06-17 NOTE — Assessment & Plan Note (Signed)
Patient presents to the ER for evaluation of rectal bleeding. She has stage IV hemorrhoids most likely secondary to portal hypertension from her known liver cirrhosis and continues to have intermittent rectal bleeding with anemia requiring blood transfusion. Patient presents to the ER after having copious amounts of rectal bleeding on the day of admission and has a hemoglobin of 5.2. She will be transfused 2 units of packed RBC Monitor H&H closely and transfuse as needed GI consult

## 2022-06-17 NOTE — ED Notes (Signed)
Dialstolic BP low - text paged Dr. Francine Graven  Report to Annie Main, [place]

## 2022-06-17 NOTE — Assessment & Plan Note (Signed)
Treatment as outlined in 1 

## 2022-06-17 NOTE — Assessment & Plan Note (Signed)
BMI 83.6 Complicates overall prognosis and care

## 2022-06-17 NOTE — Progress Notes (Signed)
Dr. Haig Prophet notified that pt. Has been in BR 5-7 min. Pt. States "I'm pouring blood out my rectum." Orders received to draw a CBC now. Pt. States "I don't want to be admitted." MD made aware. Pt. States "you can draw my CBC, but I don't usually feel bad unless I hit '4'(hemoglobin). Pt. Escorted out of bathroom & into stretcher. Commode full of BRB.

## 2022-06-17 NOTE — H&P (Signed)
History and Physical    Patient: Rachel Huber [id number]:[id number] DOB: 03-02-1956 DOA: 06/17/2022 DOS: the patient was seen and examined on 06/17/2022 PCP: [name], MD  Patient coming from: Home  Chief Complaint:  Chief Complaint  Patient presents with   Rectal Bleeding   HPI: Rachel Huber is a 66 y.o. female with medical history significant for liver cirrhosis with portal hypertension status post TIPS in May, 2023 from [place], currently on the transplant list, history of hypertension, diabetes mellitus with stage 3b CKD, depression, obesity, obstructive sleep apnea on CPAP who was sent to the emergency room from specials unit where she had paracentesis done today. She had 8 L of ascitic fluid drained today Patient states that she has had intermittent rectal bleeding but worse on the morning of her admission.  She had to use the bathroom prior to coming to the hospital for her procedure and passed a copious amount of bright red blood with clots.  She had 2 more episodes of bright red blood per rectum while in the specials unit and her gastroenterologist was contacted who ordered a CBC.  Patient's hemoglobin returned at 5.2.  She was transfused 2 units of packed RBC about a week ago for hemoglobin of 6.5 and states that she gets blood transfusions regularly for symptomatic anemia. She has bilateral lower extremity swelling that is chronic and also has anasarca. She complains of feeling weak and fatigued but denies feeling dizzy, no lightheadedness, no chest pain, no shortness of breath, no syncope, no falls, no abdominal pain, no urinary symptoms, no fever, no chills, no cough, no focal deficit or blurred vision. Labs reveal a hemoglobin of 5.2, hematocrit 17.3, platelet count of 146 Patient was last transfused on 06/12/22 for hemoglobin of 6.5 CT angiogram of the abdomen and pelvis showed stable atherosclerotic calcifications involving the abdominal aorta and branch vessels but no  aneurysm or dissection. No active GI bleed is identified. Stable cirrhotic changes involving the liver with associated splenomegaly and moderate volume abdominal/pelvic ascites. Tips shunt is in place and appears patent. Cholelithiasis and milk of calcium and sludge within the gallbladder. Diffuse body wall edema consistent with anasarca. Aortic atherosclerosis. She will be referred to observation status for further evaluation. ER physician had talked to the patient's gastroenterologist and he will see patient in a.m.     Review of Systems: As mentioned in the history of present illness. All other systems reviewed and are negative. Past Medical History:  Diagnosis Date   Anxiety    Arthritis    Cirrhosis of liver ([place])    Diabetes mellitus without complication (HCC)    Dyspnea    DOE   GERD (gastroesophageal reflux disease)    Grade IV internal hemorrhoids    Contingency plan for any future admissions for severe anemia in the setting of persistent GI bleed:  nuclear medicine tagged RBC scan to assess for location of GI bleeding - if from rectum, would then proceed to angiogram with possible repeat embolization.  Please notify IR in this case.   Hepatitis    PAST   Hypertension    Neuropathy    Neuropathy, diabetic ([place])    Pneumonia    Sinus complaint    Sleep apnea    CPAP   Thalassemia minor 1992   Past Surgical History:  Procedure Laterality Date   ABDOMINAL HYSTERECTOMY     BREAST BIOPSY Right 02/15/2018   [place] bx 6-6:30 ribbon shape, ONE CORE FRAGMENT WITH FIBROSIS. ONE  CORE FRAGMENT WITH PORTION OF A DILATED   BREAST BIOPSY Right 02/15/2018   [place] bx 9:00 heart shape, USUAL DUCTAL HYPERPLASIA   BREAST LUMPECTOMY Right 03/09/2018   Procedure: BREAST LUMPECTOMY x 2;  Surgeon: Benjamine Sprague, DO;  Location: [facility] ORS;  Service: General;  Laterality: Right;   CATARACT EXTRACTION W/PHACO Left 06/11/2016   Procedure: CATARACT EXTRACTION PHACO AND INTRAOCULAR LENS PLACEMENT (IOC);   Surgeon: Estill Cotta, MD;  Location: [facility] ORS;  Service: Ophthalmology;  Laterality: Left;  Lot # [id number] H US:01:38.6 AP%:26.4 CDE:44.15   CATARACT EXTRACTION W/PHACO Right 06/15/2018   Procedure: CATARACT EXTRACTION PHACO AND INTRAOCULAR LENS PLACEMENT (IOC);  Surgeon: Birder Robson, MD;  Location: [facility] ORS;  Service: Ophthalmology;  Laterality: Right;  [place] 00:38.2 CDE 4.23 Fluid Pack Lot # [id number]   COLONOSCOPIES     COLONOSCOPY WITH PROPOFOL N/A 10/10/2020   Procedure: COLONOSCOPY WITH PROPOFOL;  Surgeon: Lesly Rubenstein, MD;  Location: [facility] ENDOSCOPY;  Service: Endoscopy;  Laterality: N/A;   COLONOSCOPY WITH PROPOFOL N/A 11/20/2020   Procedure: COLONOSCOPY WITH PROPOFOL;  Surgeon: Lesly Rubenstein, MD;  Location: [facility] ENDOSCOPY;  Service: Endoscopy;  Laterality: N/A;  DM STAT CBC, BMP COVID POSITIVE 09/02/2020   CTR     ESOPHAGOGASTRODUODENOSCOPY (EGD) WITH PROPOFOL N/A 10/10/2020   Procedure: ESOPHAGOGASTRODUODENOSCOPY (EGD) WITH PROPOFOL;  Surgeon: Lesly Rubenstein, MD;  Location: [facility] ENDOSCOPY;  Service: Endoscopy;  Laterality: N/A;  COVID POSITIVE 10/08/2020   FLEXIBLE SIGMOIDOSCOPY N/A 02/06/2022   Procedure: FLEXIBLE SIGMOIDOSCOPY;  Surgeon: Lesly Rubenstein, MD;  Location: [facility] ENDOSCOPY;  Service: Endoscopy;  Laterality: N/A;  Patient requests anesthesia   IR ANGIOGRAM SELECTIVE EACH ADDITIONAL VESSEL  02/07/2022   IR ANGIOGRAM SELECTIVE EACH ADDITIONAL VESSEL  02/07/2022   IR ANGIOGRAM VISCERAL SELECTIVE  02/07/2022   IR EMBO ARTERIAL NOT HEMORR HEMANG INC GUIDE ROADMAPPING  02/07/2022   IR IMAGING GUIDED PORT INSERTION  06/13/2022   IR PARACENTESIS  12/17/2021   IR PARACENTESIS  03/25/2022   IR RADIOLOGIST EVAL & MGMT  03/18/2022   IR RADIOLOGIST EVAL & MGMT  05/07/2022   IR RADIOLOGIST EVAL & MGMT  05/16/2022   IR US GUIDE VASC ACCESS RIGHT  02/07/2022   JOINT REPLACEMENT     KNEE SURGERY Right 09/24/2017   plates and pins   TOTAL SHOULDER ARTHROPLASTY Right  09/27/2015   Social History:  reports that she has never smoked. She has never used smokeless tobacco. She reports that she does not drink alcohol and does not use drugs.  Allergies  Allergen Reactions   Gramineae Pollens Other (See Comments)    Sneezing, Running nose   Latex Rash    Contact rash    Family History  Problem Relation Age of Onset   Breast cancer Mother 16   Lymphoma Mother    Diabetes Father    Kidney cancer Father    Heart disease Father    Diabetes Sister    Breast cancer Sister 76    Prior to Admission medications   Medication Sig Start Date End Date Taking? Authorizing Provider  acetaminophen (TYLENOL) 650 MG CR tablet Take 1,300 mg by mouth daily as needed for pain.    [provider]  albuterol (VENTOLIN HFA) 108 (90 Base) MCG/ACT inhaler Inhale 2 puffs into the lungs every 6 (six) hours as needed for wheezing or shortness of breath. 02/10/22   Loletha Grayer, MD  amoxicillin (AMOXIL) 500 MG capsule SMARTSIG:4 Capsule(s) By Mouth Once 05/28/22   [provider]  atorvastatin (LIPITOR) 10 MG tablet Take 10 mg by mouth daily. 01/28/21   [provider]  diphenhydrAMINE (BENADRYL) 25 MG tablet Take 25 mg by mouth 2 (two) times daily as needed for itching.    [provider]  ergocalciferol (VITAMIN D2) 1.25 MG (50000 UT) capsule Take 1 capsule (50,000 Units total) by mouth once a week. 04/02/22   Harriett Rush, PA-C  gabapentin (NEURONTIN) 400 MG capsule Take 800 mg by mouth 2 (two) times daily. 01/07/18   [provider]  hydrocortisone (ANUSOL-HC) 2.5 % rectal cream Place rectally 4 (four) times daily. 03/07/22   Loletha Grayer, MD  lactulose, encephalopathy, (CHRONULAC) 10 GM/15ML SOLN Take 30 mLs (20 g total) by mouth daily. 01/30/22   Roxan Hockey, MD  melatonin 5 MG TABS Take 10 mg by mouth at bedtime as needed (sleep).    [provider]  Multiple Vitamins-Minerals (MULTIVITAMIN WITH MINERALS)  tablet Take 1 tablet by mouth daily.    [provider]  sertraline (ZOLOFT) 50 MG tablet Take 50 mg by mouth daily. 12/12/17   [provider]  sodium chloride (OCEAN) 0.65 % SOLN nasal spray Place 1 spray into both nostrils as needed for congestion. Patient taking differently: Place 1 spray into both nostrils at bedtime. 02/10/22   Loletha Grayer, MD  vitamin B-12 (CYANOCOBALAMIN) 1000 MCG tablet Take 1,000 mcg by mouth daily.    [provider]  witch hazel-glycerin (TUCKS) pad Apply topically as needed for hemorrhoids. 03/07/22   Loletha Grayer, MD    Physical Exam: Vitals:   06/17/22 1700 06/17/22 1715 06/17/22 1730 06/17/22 1748  BP:  (!) 116/48 (!) 110/41 (!) 112/43  Pulse: 89 94 89 90  Resp: 20 (!) 22 17 20   Temp:   98.5 F (36.9 C) 98.5 F (36.9 C)  TempSrc:   Oral Oral  SpO2: 100% 100% 100% 98%  Weight:      Height:       Physical Exam Vitals and nursing note reviewed.  Constitutional:      Appearance: She is obese.  HENT:     Head: Normocephalic and atraumatic.     Nose: Nose normal.     Mouth/Throat:     Mouth: Mucous membranes are moist.  Eyes:     Comments: Pale conjunctiva  Cardiovascular:     Rate and Rhythm: Normal rate and regular rhythm.  Pulmonary:     Effort: Pulmonary effort is normal.     Breath sounds: Normal breath sounds.  Abdominal:     General: Abdomen is flat. Bowel sounds are normal.     Palpations: Abdomen is soft.     Comments: Central adiposity  Musculoskeletal:     Cervical back: Normal range of motion and neck supple.     Right lower leg: Edema present.     Left lower leg: Edema present.  Skin:    General: Skin is warm and dry.  Neurological:     General: No focal deficit present.     Mental Status: She is alert and oriented to person, place, and time.  Psychiatric:        Mood and Affect: Mood normal.        Behavior: Behavior normal.     Data Reviewed: Relevant notes from primary care and  specialist visits, past discharge summaries as available in EHR, including Care Everywhere. Prior diagnostic testing as pertinent to current admission diagnoses Updated medications and problem lists for reconciliation ED course, including vitals, labs,  imaging, treatment and response to treatment Triage notes, nursing and pharmacy notes and ED provider's notes Notable results as noted in HPI Labs reviewed.  Sodium 137, potassium 4.6, chloride 112, bicarb 20, glucose 196, BUN 39, creatinine 1.73, calcium 7.8, total protein 4.2, albumin 2.5, AST 32, ALT 18, alkaline phosphatase 96, total bilirubin 1.9, white count 5.2, hemoglobin 5.2, hematocrit 17.3, platelet count 146 Twelve-lead EKG reviewed by me shows sinus tachycardia with PVCs and low voltage QRS. There are no new results to review at this time.  Assessment and Plan: * ABLA (acute blood loss anemia) Patient presents to the ER for evaluation of rectal bleeding. She has stage IV hemorrhoids most likely secondary to portal hypertension from her known liver cirrhosis and continues to have intermittent rectal bleeding with anemia requiring blood transfusion. Patient presents to the ER after having copious amounts of rectal bleeding on the day of admission and has a hemoglobin of 5.2. She will be transfused 2 units of packed RBC Monitor H&H closely and transfuse as needed GI consult  Rectal bleeding Treatment as outlined in 1  Liver cirrhosis secondary to [facility]) Patient has a history of decompensated liver cirrhosis with complications of portal hypertension status post TIPS procedure in May with revision but continues to have significant ascites requiring weekly paracentesis. She is status post paracentesis on the day of admission with drainage of 8 L of ascitic fluid. Currently not on any diuretic therapy due to worsening renal function.   Depression with anxiety Stable Continue sertraline  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  mellitus ([place]) Patient has diabetes mellitus with complications of stage III chronic kidney disease Maintain consistent carbohydrate diet Glycemic control with sliding scale insulin  Obesity (BMI 30-39.9) BMI 27.0 Complicates overall prognosis and care  OSA on CPAP Secondary to obesity Continue CPAP at bedtime      Advance Care Planning:   Code Status: Prior   Consults: Gastroenterology  Family Communication: Greater than 50% of time was spent discussing patient's condition and plan of care with her and her husband at the bedside.  All questions and concerns have been addressed.  They verbalized understanding and agree with the plan.  She lists her husband as her healthcare power of attorney.  CODE STATUS was discussed and she wishes to be a full code.  Severity of Illness: The appropriate patient status for this patient is OBSERVATION. Observation status is judged to be reasonable and necessary in order to provide the required intensity of service to ensure the patient's safety. The patient's presenting symptoms, physical exam findings, and initial radiographic and laboratory data in the context of their medical condition is felt to place them at decreased risk for further clinical deterioration. Furthermore, it is anticipated that the patient will be medically stable for discharge from the hospital within 2 midnights of admission.   Author: Collier Bullock, MD 06/17/2022 5:54 PM  For on call review [email or web address].

## 2022-06-17 NOTE — OR Nursing (Signed)
Informed pt hemoglobin 5.2 and HCT 17.3. pt said she would discuss with her husband whether she will go to emergency room., Dr Haig Prophet advised.  1599 transferred to emergency room, with patient consent. Transferred care to [place]. Husband arrived and at patient bedside. Second bottle of albumin infusing. Nurse Bill aware.

## 2022-06-17 NOTE — Assessment & Plan Note (Signed)
Secondary to obesity Continue CPAP at bedtime

## 2022-06-17 NOTE — ED Notes (Signed)
Pt BP starting to trend down - notified provider.

## 2022-06-17 NOTE — Assessment & Plan Note (Signed)
Patient has diabetes mellitus with complications of stage III chronic kidney disease Maintain consistent carbohydrate diet Glycemic control with sliding scale insulin

## 2022-06-17 NOTE — Assessment & Plan Note (Signed)
Patient has a history of decompensated liver cirrhosis with complications of portal hypertension status post TIPS procedure in May with revision but continues to have significant ascites requiring weekly paracentesis. She is status post paracentesis on the day of admission with drainage of 8 L of ascitic fluid. Currently not on any diuretic therapy due to worsening renal function.

## 2022-06-17 NOTE — ED Notes (Signed)
Called CT for update on scan - en route now.

## 2022-06-17 NOTE — Assessment & Plan Note (Signed)
Stable Continue sertraline

## 2022-06-17 NOTE — ED Provider Notes (Signed)
[facility] Provider Note    Event Date/Time   First MD Initiated Contact with Patient 06/17/22 1515     (approximate)   History   Rectal Bleeding   HPI  Rachel Huber is a 66 y.o. female with a history of cirrhosis from [place] with portal hypertension she had a TIPS done in May of this year she has hypertension type 2 diabetes depression and thalassemia minor.  She also has OSA with CPAP.  She has had bleeding hemorrhoids requiring transfusion and particle and coil embolization of the rectal artery in summary with Dr. Vena Austria following her at [facility].  Patient was in specials getting a paracentesis and was sent here for the increasing rectal bleeding.  Patient says normally she has some dripping into the toilet or spotting on the toilet tissue but today at home when she went to stool it was like somebody turned on the sink and blood was pouring out of her.  She was currently somewhat lightheaded with sitting and pale.  She reports her hemorrhoids were tense prior to the paracentesis but they are now somewhat deflated and less tender.      Physical Exam   Triage Vital Signs: ED Triage Vitals  Enc Vitals Group     BP 06/17/22 1509 (!) 134/47     Pulse Rate 06/17/22 1508 (!) 104     Resp 06/17/22 1508 20     Temp 06/17/22 1508 98.3 F (36.8 C)     Temp Source 06/17/22 1508 Oral     SpO2 06/17/22 1508 100 %     Weight 06/17/22 1504 249 lb 1.9 oz (113 kg)     Height 06/17/22 1504 5' 10"  (1.778 m)     Head Circumference --      Peak Flow --      Pain Score 06/17/22 1504 0     Pain Loc --      Pain Edu? --      Excl. in [place]? --     Most recent vital signs: Vitals:   06/17/22 1730 06/17/22 1748  BP: (!) 110/41 (!) 112/43  Pulse: 89 90  Resp: 17 20  Temp: 98.5 F (36.9 C) 98.5 F (36.9 C)  SpO2: 100% 98%     General: Awake, no distress. CV:  Good peripheral perfusion.  Heart regular rate and rhythm somewhat tacky Resp:  Normal effort.  Lungs are  clear Abd:  No distention.  Soft and nontender Extremities: Positive edema some venous stasis changes   ED Results / Procedures / Treatments   Labs (all labs ordered are listed, but only abnormal results are displayed) Labs Reviewed  COMPREHENSIVE METABOLIC PANEL - Abnormal; Notable for the following components:      Result Value   Chloride 112 (*)    CO2 20 (*)    Glucose, Bld 196 (*)    BUN 39 (*)    Creatinine, Ser 1.73 (*)    Calcium 7.8 (*)    Total Protein 4.2 (*)    Albumin 2.5 (*)    Total Bilirubin 1.9 (*)    GFR, Estimated 33 (*)    All other components within normal limits  TYPE AND SCREEN  PREPARE RBC (CROSSMATCH)     EKG  EKG read and interpreted by me shows sinus tachycardia rate of 108 normal axis no acute changes are seen there is decreased amplitude.   RADIOLOGY Ultrasound-guided paracentesis was done with 8 L of fluid taken off patient  got some IV albumin afterward. CT angio done shows no acute GI bleed radiology thinks that the TIPS shunt is patent.  I reviewed the film and agree.  PROCEDURES:  Critical Care performed: Critical care time 40 minutes.  This includes talking to the patient reviewing the old records examining the patient and speaking with the hospitalist twice speaking with IR and Dr. Haig Prophet and Dr. Marius Ditch  Procedures   MEDICATIONS ORDERED IN ED: Medications  0.9 %  sodium chloride infusion (has no administration in time range)  iohexol (OMNIPAQUE) 350 MG/ML injection 75 mL (75 mLs Intravenous Contrast Given 06/17/22 1709)  sodium chloride 0.9 % bolus 250 mL (0 mLs Intravenous Stopped 06/17/22 1750)     IMPRESSION / MDM / [place] / ED COURSE  I reviewed the triage vital signs and the nursing notes. Discussed patient with Dr. Haig Prophet.  He was going to do a colonoscopy on Friday.  We will plan to get her in the hospital and he will see her tomorrow.  Possibly do the colonoscopy earlier.  We will get a bleeding scan in  the meantime.  Patient reports not only did she have the rectal artery embolized but in the past 2 other arteries were coagulated with a colonoscopy.  I can have not found that in the old record at this point. Discussed patient with Dr. Marius Ditch she wants me to get an ultrasound Doppler to see if the TIPS is present I also discussed patient with IR they want a CT angio to see if there is something we can embolize as they have previously embolized to the rectal artery.  They do not want the bleeding scan I have canceled it.  Differential diagnosis includes, but is not limited to, bleeding hemorrhoids, bleeding AVM in the colon, diverticular bleed, polyp bleeding, massive upper GI bleed  Patient's presentation is most consistent with acute presentation with potential threat to life or bodily function.  The patient is on the cardiac monitor to evaluate for evidence of arrhythmia and/or significant heart rate changes.  None have been seen    FINAL CLINICAL IMPRESSION(S) / ED DIAGNOSES   Final diagnoses:  Rectal bleeding     Rx / DC Orders   ED Discharge Orders     None        Note:  This document was prepared using Dragon voice recognition software and may include unintentional dictation errors.   Nena Polio, MD 06/17/22 1758

## 2022-06-18 ENCOUNTER — Inpatient Hospital Stay: Payer: 59

## 2022-06-18 ENCOUNTER — Encounter: Payer: Self-pay | Admitting: Internal Medicine

## 2022-06-18 DIAGNOSIS — D563 Thalassemia minor: Secondary | ICD-10-CM | POA: Diagnosis not present

## 2022-06-18 DIAGNOSIS — E1140 Type 2 diabetes mellitus with diabetic neuropathy, unspecified: Secondary | ICD-10-CM | POA: Diagnosis not present

## 2022-06-18 DIAGNOSIS — Z7682 Awaiting organ transplant status: Secondary | ICD-10-CM | POA: Diagnosis not present

## 2022-06-18 DIAGNOSIS — N183 Chronic kidney disease, stage 3 unspecified: Secondary | ICD-10-CM | POA: Diagnosis not present

## 2022-06-18 DIAGNOSIS — I700 Atherosclerosis of aorta: Secondary | ICD-10-CM | POA: Diagnosis not present

## 2022-06-18 DIAGNOSIS — Z8616 Personal history of COVID-19: Secondary | ICD-10-CM | POA: Diagnosis not present

## 2022-06-18 DIAGNOSIS — K643 Fourth degree hemorrhoids: Secondary | ICD-10-CM | POA: Diagnosis not present

## 2022-06-18 DIAGNOSIS — I129 Hypertensive chronic kidney disease with stage 1 through stage 4 chronic kidney disease, or unspecified chronic kidney disease: Secondary | ICD-10-CM | POA: Diagnosis not present

## 2022-06-18 DIAGNOSIS — K922 Gastrointestinal hemorrhage, unspecified: Secondary | ICD-10-CM | POA: Diagnosis not present

## 2022-06-18 DIAGNOSIS — K6289 Other specified diseases of anus and rectum: Secondary | ICD-10-CM | POA: Diagnosis not present

## 2022-06-18 DIAGNOSIS — Z803 Family history of malignant neoplasm of breast: Secondary | ICD-10-CM | POA: Diagnosis not present

## 2022-06-18 DIAGNOSIS — D62 Acute posthemorrhagic anemia: Secondary | ICD-10-CM | POA: Diagnosis not present

## 2022-06-18 DIAGNOSIS — K921 Melena: Secondary | ICD-10-CM | POA: Diagnosis not present

## 2022-06-18 DIAGNOSIS — K8020 Calculus of gallbladder without cholecystitis without obstruction: Secondary | ICD-10-CM | POA: Diagnosis not present

## 2022-06-18 DIAGNOSIS — K7581 Nonalcoholic steatohepatitis (NASH): Secondary | ICD-10-CM | POA: Diagnosis not present

## 2022-06-18 DIAGNOSIS — F418 Other specified anxiety disorders: Secondary | ICD-10-CM | POA: Diagnosis not present

## 2022-06-18 DIAGNOSIS — R161 Splenomegaly, not elsewhere classified: Secondary | ICD-10-CM | POA: Diagnosis not present

## 2022-06-18 DIAGNOSIS — K766 Portal hypertension: Secondary | ICD-10-CM | POA: Diagnosis not present

## 2022-06-18 DIAGNOSIS — K219 Gastro-esophageal reflux disease without esophagitis: Secondary | ICD-10-CM | POA: Diagnosis not present

## 2022-06-18 DIAGNOSIS — K625 Hemorrhage of anus and rectum: Secondary | ICD-10-CM | POA: Diagnosis not present

## 2022-06-18 DIAGNOSIS — K6389 Other specified diseases of intestine: Secondary | ICD-10-CM | POA: Diagnosis not present

## 2022-06-18 DIAGNOSIS — E1122 Type 2 diabetes mellitus with diabetic chronic kidney disease: Secondary | ICD-10-CM | POA: Diagnosis not present

## 2022-06-18 DIAGNOSIS — K644 Residual hemorrhoidal skin tags: Secondary | ICD-10-CM | POA: Diagnosis not present

## 2022-06-18 DIAGNOSIS — K648 Other hemorrhoids: Secondary | ICD-10-CM | POA: Diagnosis not present

## 2022-06-18 DIAGNOSIS — K7460 Unspecified cirrhosis of liver: Secondary | ICD-10-CM | POA: Diagnosis not present

## 2022-06-18 DIAGNOSIS — N1832 Chronic kidney disease, stage 3b: Secondary | ICD-10-CM | POA: Diagnosis not present

## 2022-06-18 DIAGNOSIS — E669 Obesity, unspecified: Secondary | ICD-10-CM | POA: Diagnosis not present

## 2022-06-18 DIAGNOSIS — G4733 Obstructive sleep apnea (adult) (pediatric): Secondary | ICD-10-CM | POA: Diagnosis not present

## 2022-06-18 DIAGNOSIS — Z6835 Body mass index (BMI) 35.0-35.9, adult: Secondary | ICD-10-CM | POA: Diagnosis not present

## 2022-06-18 DIAGNOSIS — R180 Malignant ascites: Secondary | ICD-10-CM | POA: Diagnosis not present

## 2022-06-18 LAB — BASIC METABOLIC PANEL
Anion gap: 3 — ABNORMAL LOW (ref 5–15)
BUN: 38 mg/dL — ABNORMAL HIGH (ref 8–23)
CO2: 22 mmol/L (ref 22–32)
Calcium: 8 mg/dL — ABNORMAL LOW (ref 8.9–10.3)
Chloride: 113 mmol/L — ABNORMAL HIGH (ref 98–111)
Creatinine, Ser: 1.61 mg/dL — ABNORMAL HIGH (ref 0.44–1.00)
GFR, Estimated: 36 mL/min — ABNORMAL LOW (ref >60)
Glucose, Bld: 134 mg/dL — ABNORMAL HIGH (ref 70–99)
Potassium: 4.8 mmol/L (ref 3.5–5.1)
Sodium: 138 mmol/L (ref 135–145)

## 2022-06-18 LAB — HEMOGLOBIN AND HEMATOCRIT, BLOOD
HCT: 15.9 % — ABNORMAL LOW (ref 36.0–46.0)
HCT: 19.4 % — ABNORMAL LOW (ref 36.0–46.0)
HCT: 22.5 % — ABNORMAL LOW (ref 36.0–46.0)
Hemoglobin: 5.1 g/dL — ABNORMAL LOW (ref 12.0–15.0)
Hemoglobin: 6.4 g/dL — ABNORMAL LOW (ref 12.0–15.0)
Hemoglobin: 7.4 g/dL — ABNORMAL LOW (ref 12.0–15.0)

## 2022-06-18 MED ORDER — SODIUM CHLORIDE 0.9 % IV SOLN
1.0000 g | INTRAVENOUS | Status: DC
  Administered 2022-06-18 – 2022-06-19 (×2): 1 g via INTRAVENOUS
  Filled 2022-06-18 (×2): qty 10

## 2022-06-18 MED ORDER — SODIUM CHLORIDE 0.9% IV SOLUTION
Freq: Once | INTRAVENOUS | Status: AC
  Filled 2022-06-18: qty 250

## 2022-06-18 MED ORDER — SODIUM SULFATE-MAG SULFATE-KCL 1479-225-188 MG PO TABS
12.0000 | ORAL_TABLET | Freq: Two times a day (BID) | ORAL | Status: AC
  Administered 2022-06-18 – 2022-06-19 (×2): 12 via ORAL
  Filled 2022-06-18 (×2): qty 1

## 2022-06-18 MED ORDER — CHLORHEXIDINE GLUCONATE CLOTH 2 % EX PADS
6.0000 | MEDICATED_PAD | Freq: Every day | CUTANEOUS | Status: DC
  Administered 2022-06-19 – 2022-06-20 (×2): 6 via TOPICAL

## 2022-06-18 MED ORDER — POLYETHYLENE GLYCOL 3350 17 G PO PACK
17.0000 g | PACK | Freq: Two times a day (BID) | ORAL | Status: DC
  Administered 2022-06-18 – 2022-06-20 (×3): 17 g via ORAL
  Filled 2022-06-18 (×4): qty 1

## 2022-06-18 NOTE — ED Notes (Addendum)
Bedpan removed from under Pt, Pt did have medium size BM, no blood noted in stool. Pt cleaned and new chux applied beneath Pt. New purewick also applied. Pt given warm blanket, Pt resting comfortably at this time.

## 2022-06-18 NOTE — Consult Note (Signed)
Consultation  Referring Provider:     Hospitalist Admit date: 10/31 Consult date: 11/1         Reason for Consultation:     Hematochezia         HPI:   Rachel Huber is a 66 y.o. 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 She follows at [facility] with Hepatology. She has had persistent issues with hematochezia attributed to hemorrhoids which are likely made worse by her portal hypertension. She has required severe blood transfusions over the past month. She had a hemorrhoidal embolization with IR that helped some but more recently her symptoms have worsened. She was scheduled to have repeat colonoscopy this Friday. She had two colonoscopies last year with inadequate prep but no large masses.    Past Medical History:  Diagnosis Date   Anxiety    Arthritis    Cirrhosis of liver ([place])    Diabetes mellitus without complication (HCC)    Dyspnea    DOE   GERD (gastroesophageal reflux disease)    Grade IV internal hemorrhoids    Contingency plan for any future admissions for severe anemia in the setting of persistent GI bleed:  nuclear medicine tagged RBC scan to assess for location of GI bleeding - if from rectum, would then proceed to angiogram with possible repeat embolization.  Please notify IR in this case.   Hepatitis    PAST   Hypertension    Neuropathy    Neuropathy, diabetic ([place])    Pneumonia    Sinus complaint    Sleep apnea    CPAP   Thalassemia minor 1992    Past Surgical History:  Procedure Laterality Date   ABDOMINAL HYSTERECTOMY     BREAST BIOPSY Right 02/15/2018   [place] bx 6-6:30 ribbon shape, ONE CORE FRAGMENT WITH FIBROSIS. ONE CORE FRAGMENT WITH PORTION OF A DILATED   BREAST BIOPSY Right 02/15/2018   [place] bx 9:00 heart shape, USUAL DUCTAL HYPERPLASIA   BREAST LUMPECTOMY Right 03/09/2018   Procedure: BREAST LUMPECTOMY x 2;  Surgeon: Benjamine Sprague, DO;  Location: [facility] ORS;  Service:  General;  Laterality: Right;   CATARACT EXTRACTION W/PHACO Left 06/11/2016   Procedure: CATARACT EXTRACTION PHACO AND INTRAOCULAR LENS PLACEMENT (IOC);  Surgeon: Estill Cotta, MD;  Location: [facility] ORS;  Service: Ophthalmology;  Laterality: Left;  Lot # [id number] H US:01:38.6 AP%:26.4 CDE:44.15   CATARACT EXTRACTION W/PHACO Right 06/15/2018   Procedure: CATARACT EXTRACTION PHACO AND INTRAOCULAR LENS PLACEMENT (IOC);  Surgeon: Birder Robson, MD;  Location: [facility] ORS;  Service: Ophthalmology;  Laterality: Right;  [place] 00:38.2 CDE 4.23 Fluid Pack Lot # [id number]   COLONOSCOPIES     COLONOSCOPY WITH PROPOFOL N/A 10/10/2020   Procedure: COLONOSCOPY WITH PROPOFOL;  Surgeon: Lesly Rubenstein, MD;  Location: [facility] ENDOSCOPY;  Service: Endoscopy;  Laterality: N/A;   COLONOSCOPY WITH PROPOFOL N/A 11/20/2020   Procedure: COLONOSCOPY WITH PROPOFOL;  Surgeon: Lesly Rubenstein, MD;  Location: [facility] ENDOSCOPY;  Service: Endoscopy;  Laterality: N/A;  DM STAT CBC, BMP COVID POSITIVE 09/02/2020   CTR     ESOPHAGOGASTRODUODENOSCOPY (EGD) WITH PROPOFOL N/A 10/10/2020   Procedure: ESOPHAGOGASTRODUODENOSCOPY (EGD) WITH PROPOFOL;  Surgeon: Lesly Rubenstein, MD;  Location: [facility] ENDOSCOPY;  Service: Endoscopy;  Laterality: N/A;  COVID POSITIVE 10/08/2020   FLEXIBLE SIGMOIDOSCOPY N/A 02/06/2022   Procedure: FLEXIBLE SIGMOIDOSCOPY;  Surgeon: Lesly Rubenstein, MD;  Location: [facility] ENDOSCOPY;  Service: Endoscopy;  Laterality: N/A;  Patient requests anesthesia   IR ANGIOGRAM SELECTIVE EACH ADDITIONAL VESSEL  02/07/2022   IR ANGIOGRAM SELECTIVE EACH ADDITIONAL VESSEL  02/07/2022   IR ANGIOGRAM VISCERAL SELECTIVE  02/07/2022   IR EMBO ARTERIAL NOT HEMORR HEMANG INC GUIDE ROADMAPPING  02/07/2022   IR IMAGING GUIDED PORT INSERTION  06/13/2022   IR PARACENTESIS  12/17/2021   IR PARACENTESIS  03/25/2022   IR RADIOLOGIST EVAL & MGMT  03/18/2022   IR RADIOLOGIST EVAL & MGMT  05/07/2022   IR RADIOLOGIST EVAL & MGMT  05/16/2022   IR  US GUIDE VASC ACCESS RIGHT  02/07/2022   JOINT REPLACEMENT     KNEE SURGERY Right 09/24/2017   plates and pins   TOTAL SHOULDER ARTHROPLASTY Right 09/27/2015    Family History  Problem Relation Age of Onset   Breast cancer Mother 42   Lymphoma Mother    Diabetes Father    Kidney cancer Father    Heart disease Father    Diabetes Sister    Breast cancer Sister 70   Social History   Tobacco Use   Smoking status: Never   Smokeless tobacco: Never  Vaping Use   Vaping Use: Never used  Substance Use Topics   Alcohol use: No   Drug use: Never    Prior to Admission medications   Medication Sig Start Date End Date Taking? Authorizing Provider  atorvastatin (LIPITOR) 10 MG tablet Take 10 mg by mouth daily. 01/28/21  Yes [provider]  diphenhydrAMINE (BENADRYL) 25 MG tablet Take 25 mg by mouth 2 (two) times daily as needed for itching.   Yes [provider]  gabapentin (NEURONTIN) 400 MG capsule Take 800 mg by mouth 2 (two) times daily. 01/07/18  Yes [provider]  hydrocortisone (ANUCORT-HC) 25 MG suppository Place 25 mg rectally 2 (two) times daily as needed for anal itching or hemorrhoids. 06/17/22  Yes [provider]  hydrocortisone (ANUSOL-HC) 2.5 % rectal cream Place rectally 4 (four) times daily. 03/07/22  Yes Wieting, Richard, MD  melatonin 5 MG TABS Take 10 mg by mouth at bedtime as needed (sleep).   Yes [provider]  Multiple Vitamins-Minerals (MULTIVITAMIN WITH MINERALS) tablet Take 1 tablet by mouth daily.   Yes [provider]  sertraline (ZOLOFT) 50 MG tablet Take 50 mg by mouth daily. 12/12/17  Yes [provider]  vitamin B-12 (CYANOCOBALAMIN) 1000 MCG tablet Take 1,000 mcg by mouth daily.   Yes [provider]  acetaminophen (TYLENOL) 650 MG CR tablet Take 1,300 mg by mouth daily as needed for pain.    [provider]  albuterol (VENTOLIN HFA) 108 (90 Base) MCG/ACT inhaler Inhale 2 puffs  into the lungs every 6 (six) hours as needed for wheezing or shortness of breath. 02/10/22   Loletha Grayer, MD  amoxicillin (AMOXIL) 500 MG capsule SMARTSIG:4 Capsule(s) By Mouth Once 05/28/22   [provider]  ergocalciferol (VITAMIN D2) 1.25 MG (50000 UT) capsule Take 1 capsule (50,000 Units total) by mouth once a week. 04/02/22   Harriett Rush, PA-C  lactulose, encephalopathy, (CHRONULAC) 10 GM/15ML SOLN Take 30 mLs (20 g total) by mouth daily. 01/30/22   Roxan Hockey, MD  sodium chloride (OCEAN) 0.65 % SOLN nasal spray Place 1 spray into both nostrils as needed for congestion. Patient not taking: Reported on 06/17/2022 02/10/22   Loletha Grayer, MD  witch hazel-glycerin (TUCKS) pad Apply topically as needed for hemorrhoids. 03/07/22   Loletha Grayer, MD    Current Facility-Administered Medications  Medication Dose Route Frequency Provider Last Rate Last Admin   0.9 %  sodium chloride infusion  10 mL/hr Intravenous Once Nena Polio, MD       0.9 %  sodium chloride infusion  250 mL Intravenous PRN Agbata, Tochukwu, MD       acetaminophen (TYLENOL) tablet 650 mg  650 mg Oral Q6H PRN Agbata, Tochukwu, MD   650 mg at 06/17/22 1847   Or   acetaminophen (TYLENOL) suppository 650 mg  650 mg Rectal Q6H PRN Agbata, Tochukwu, MD       albuterol (PROVENTIL) (2.5 MG/3ML) 0.083% nebulizer solution 3 mL  3 mL Inhalation Q6H PRN Agbata, Tochukwu, MD       atorvastatin (LIPITOR) tablet 10 mg  10 mg Oral Daily Agbata, Tochukwu, MD   10 mg at 06/18/22 3419   cyanocobalamin (VITAMIN B12) tablet 1,000 mcg  1,000 mcg Oral Daily Agbata, Tochukwu, MD   1,000 mcg at 06/18/22 0953   gabapentin (NEURONTIN) capsule 800 mg  800 mg Oral BID Agbata, Tochukwu, MD   800 mg at 06/18/22 0953   lactulose (CHRONULAC) 10 GM/15ML solution 20 g  20 g Oral Daily Agbata, Tochukwu, MD       melatonin tablet 10 mg  10 mg Oral QHS PRN Agbata, Tochukwu, MD       multivitamin with minerals tablet 1 tablet  1  tablet Oral Daily Agbata, Tochukwu, MD   1 tablet at 06/18/22 0953   ondansetron (ZOFRAN) tablet 4 mg  4 mg Oral Q6H PRN Agbata, Tochukwu, MD       Or   ondansetron (ZOFRAN) injection 4 mg  4 mg Intravenous Q6H PRN Agbata, Tochukwu, MD       sertraline (ZOLOFT) tablet 50 mg  50 mg Oral Daily Agbata, Tochukwu, MD   50 mg at 06/18/22 0953   sodium chloride flush (NS) 0.9 % injection 3 mL  3 mL Intravenous Q12H Agbata, Tochukwu, MD   3 mL at 06/18/22 0034   sodium chloride flush (NS) 0.9 % injection 3 mL  3 mL Intravenous PRN Agbata, Tochukwu, MD       [START ON 06/21/2022] Vitamin D (Ergocalciferol) (DRISDOL) 1.25 MG (50000 UNIT) capsule 50,000 Units  50,000 Units Oral Weekly Agbata, Tochukwu, MD       Current Outpatient Medications  Medication Sig Dispense Refill   atorvastatin (LIPITOR) 10 MG tablet Take 10 mg by mouth daily.     diphenhydrAMINE (BENADRYL) 25 MG tablet Take 25 mg by mouth 2 (two) times daily as needed for itching.     gabapentin (NEURONTIN) 400 MG capsule Take 800 mg by mouth 2 (two) times daily.     hydrocortisone (ANUCORT-HC) 25 MG suppository Place 25 mg rectally 2 (two) times daily as needed for anal itching or hemorrhoids.     hydrocortisone (ANUSOL-HC) 2.5 % rectal cream Place rectally 4 (four) times daily. 30 g 0   melatonin 5 MG TABS Take 10 mg by mouth at bedtime as needed (sleep).     Multiple Vitamins-Minerals (MULTIVITAMIN WITH MINERALS) tablet Take 1 tablet by mouth daily.     sertraline (ZOLOFT) 50 MG tablet Take 50 mg by mouth daily.     vitamin B-12 (CYANOCOBALAMIN) 1000 MCG tablet Take 1,000 mcg by mouth daily.     acetaminophen (TYLENOL) 650 MG CR tablet Take 1,300 mg by mouth daily as needed for pain.     albuterol (VENTOLIN HFA) 108 (90 Base) MCG/ACT inhaler Inhale 2 puffs into the lungs  every 6 (six) hours as needed for wheezing or shortness of breath. 18 g 0   amoxicillin (AMOXIL) 500 MG capsule SMARTSIG:4 Capsule(s) By Mouth Once     ergocalciferol  (VITAMIN D2) 1.25 MG (50000 UT) capsule Take 1 capsule (50,000 Units total) by mouth once a week. 5 capsule 3   lactulose, encephalopathy, (CHRONULAC) 10 GM/15ML SOLN Take 30 mLs (20 g total) by mouth daily. 946 mL 1   sodium chloride (OCEAN) 0.65 % SOLN nasal spray Place 1 spray into both nostrils as needed for congestion. (Patient not taking: Reported on 06/17/2022) 15 mL 0   witch hazel-glycerin (TUCKS) pad Apply topically as needed for hemorrhoids. 40 each 0    Allergies as of 06/17/2022 - Review Complete 06/17/2022  Allergen Reaction Noted   Gramineae pollens Other (See Comments) 10/23/2021   Latex Rash 09/02/2016     Review of Systems:    All systems reviewed and negative except where noted in HPI.  Review of Systems  Constitutional:  Negative for chills and fever.  Respiratory:  Negative for shortness of breath.   Cardiovascular:  Negative for chest pain.  Gastrointestinal:  Positive for blood in stool. Negative for diarrhea.  Musculoskeletal:  Positive for joint pain.  Neurological:  Negative for focal weakness.  Psychiatric/Behavioral:  Negative for substance abuse.   All other systems reviewed and are negative.     Physical Exam:  Vital signs in last 24 hours: Temp:  [97.8 F (36.6 C)-99.1 F (37.3 C)] 99.1 F (37.3 C) (11/01 1104) Pulse Rate:  [75-104] 83 (11/01 1245) Resp:  [11-24] 13 (11/01 1245) BP: (96-140)/(28-74) 122/53 (11/01 1245) SpO2:  [93 %-100 %] 100 % (11/01 1245) Weight:  [962 kg] 113 kg (10/31 1504) Last BM Date : 06/17/22 General:   Pleasant in NAD Head:  Normocephalic and atraumatic. Eyes:   No icterus Mouth: Mucosa pink moist, no lesions. Neck:  Supple; no masses felt Lungs:  No respiratory distress Abdomen:   distended Msk: No clubbing or cyanosis Neurologic:  Alert and  oriented x4;  Cranial nerves II-XII intact.  Skin:  Warm, dry, pink without significant lesions or rashes. Psych:  Alert and cooperative. Normal affect.  LAB  RESULTS: Recent Labs    06/17/22 1259 06/18/22 0030 06/18/22 0633 06/18/22 1210  WBC 5.2  --   --   --   HGB 5.2* 5.1* 6.4* 7.4*  HCT 17.3* 15.9* 19.4* 22.5*  PLT 146*  --   --   --    BMET Recent Labs    06/17/22 1559 06/18/22 0633  NA 137 138  K 4.6 4.8  CL 112* 113*  CO2 20* 22  GLUCOSE 196* 134*  BUN 39* 38*  CREATININE 1.73* 1.61*  CALCIUM 7.8* 8.0*   LFT Recent Labs    06/17/22 1559  PROT 4.2*  ALBUMIN 2.5*  AST 32  ALT 18  ALKPHOS 96  BILITOT 1.9*   PT/INR No results for input(s): "LABPROT", "INR" in the last 72 hours.  STUDIES: CT ANGIO GI BLEED  Result Date: 06/17/2022 CLINICAL DATA:  GI bleed. History of cirrhosis and tips. EXAM: CTA ABDOMEN AND PELVIS WITHOUT AND WITH CONTRAST TECHNIQUE: Multidetector CT imaging of the abdomen and pelvis was performed using the standard protocol during bolus administration of intravenous contrast. Multiplanar reconstructed images and MIPs were obtained and reviewed to evaluate the vascular anatomy. RADIATION DOSE REDUCTION: This exam was performed according to the departmental dose-optimization program which includes automated exposure control, adjustment of the mA  and/or kV according to patient size and/or use of iterative reconstruction technique. CONTRAST:  30m OMNIPAQUE IOHEXOL 350 MG/ML SOLN COMPARISON:  CT scan 02/05/2022 FINDINGS: VASCULAR Aorta: Stable atherosclerotic calcifications but no aneurysm or dissection. Celiac: Normal SMA: Minimal ostial calcification. No stenosis, aneurysm or dissection. Renals: Bilateral renal artery calcifications right greater than left but no significant stenosis or aneurysm. IMA: Patent Inflow: Atherosclerotic calcifications but no dissection or significant stenosis. Proximal Outflow: Atherosclerotic calcifications but no stenosis or dissection. Veins: No significant findings. Review of the MIP images confirms the above findings. NON-VASCULAR Lower chest: Stable cardiac enlargement and  coronary artery calcifications. No acute pulmonary findings or pleural effusion. Hepatobiliary: Stable cirrhotic changes involving the liver. No worrisome hepatic lesions or intrahepatic biliary dilatation. The tips is in stable position. No complicating features. The gallbladder is filled with stones and milk of calcium and sludge. No common bile duct dilatation. Pancreas: No mass, inflammation or ductal dilatation. Spleen: Stable splenomegaly. Adrenals/Urinary Tract: Adrenal glands and kidneys are unremarkable and stable. The bladder is unremarkable. Stomach/Bowel: The stomach, duodenum, small bowel and colon are grossly normal. No acute inflammatory process, mass lesions or obstructive findings. No obvious GI bleed is identified. Stable colonic diverticulosis. Lymphatic: No abdominal or pelvic lymphadenopathy. Reproductive: Surgically absent. Other: Moderate volume abdominal/pelvic ascites. There is also diffuse body wall edema consistent with anasarca. Musculoskeletal: No significant bony findings. IMPRESSION: 1. Stable atherosclerotic calcifications involving the abdominal aorta and branch vessels but no aneurysm or dissection. 2. No active GI bleed is identified. 3. Stable cirrhotic changes involving the liver with associated splenomegaly and moderate volume abdominal/pelvic ascites. Tips shunt is in place and appears patent. 4. Cholelithiasis and milk of calcium and sludge within the gallbladder. 5. Diffuse body wall edema consistent with anasarca. 6. Aortic atherosclerosis. Aortic Atherosclerosis (ICD10-I70.0). Electronically Signed   By: PMarijo SanesM.D.   On: 06/17/2022 17:44   [place]  Result Date: 06/17/2022 INDICATION: Patient with history of NASH cirrhosis status post tips and tips revision with recurrent ascites. Request is for therapeutic paracentesis. 8 L maximum EXAM: ULTRASOUND GUIDED THERAPEUTIC PARACENTESIS MEDICATIONS: Lidocaine 1% 10 mL COMPLICATIONS: None immediate. PROCEDURE:  Informed written consent was obtained from the patient after a discussion of the risks, benefits and alternatives to treatment. A timeout was performed prior to the initiation of the procedure. Initial ultrasound scanning demonstrates a large amount of ascites within the right upper abdominal quadrant. The right lower abdomen was prepped and draped in the usual sterile fashion. 1% lidocaine was used for local anesthesia. Following this, a 19 gauge, 10-cm, Yueh catheter was introduced. An ultrasound image was saved for documentation purposes. The paracentesis was performed. The catheter was removed and a dressing was applied. The patient tolerated the procedure well without immediate post procedural complication. Patient received post-procedure intravenous albumin; see nursing notes for details. FINDINGS: A total of approximately 8 L of straw-colored fluid was removed. IMPRESSION: Successful ultrasound-guided therapeutic paracentesis yielding 8 liters of peritoneal fluid. Read by: JRushie Nyhan NP Electronically Signed   By: JCorrie MckusickD.O.   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 Concern for varicealization of her hemorrhoids  - will plan on colonoscopy tomorrow - clear liquids - NPO at midnight - two large bore IV's - will order antibiotics given bleeding and cirrhosis - partner  is going to bring in SU-Tab for her prep which she already has at home, will need to run by pharmacy to see if ok to administer as an inpatient. She is not tolerant of golytely - transfuse to keep hemoglobin > 7 - daily CBC/CMP/INR - further recs after procedure tomorrow  Please call with any questions or concerns.  Raylene Miyamoto MD, MPH [place]

## 2022-06-18 NOTE — Progress Notes (Addendum)
       CROSS COVER NOTE  NAME: Rachel Huber MRN: [id number] DOB : 10/25/1955 ATTENDING PHYSICIAN: Collier Bullock, MD    Date of Service   06/18/2022   HPI/Events of Note   Notified of AM HGB--> 5.1 down from 5.3 after already receiving 2U PRBCs  Interventions   Assessment/Plan:  Transfuse 2U PRBC     This document was prepared using Dragon voice recognition software and may include unintentional dictation errors.  [name] DNP, MBA, FNP-BC Nurse Practitioner [facility] Pager [phone]

## 2022-06-18 NOTE — Plan of Care (Signed)

## 2022-06-18 NOTE — Progress Notes (Signed)
[facility] [place] NAME: Rachel Huber    MR#:  [id number]  DATE OF BIRTH:  Mar 11, 1956  SUBJECTIVE:  no family at bedside h Patient came in for her routine paracentesis and eternal left of right red blood per rectum. Hemoglobin was down to 5.0 She receives four units of blood transfusion. Patient had bowel movement without blood.  VITALS:  Blood pressure (!) 121/49, pulse 92, temperature 99.1 F (37.3 C), temperature source Oral, resp. rate 19, height 5' 10"  (1.778 m), weight 113 kg, SpO2 97 %.  PHYSICAL EXAMINATION:   GENERAL:  66 y.o.-year-old patient lying in the bed with no acute distress. Pallor+ LUNGS: Normal breath sounds bilaterally, no wheezing CARDIOVASCULAR: S1, S2 normal. No murmurs,   ABDOMEN: Soft, nontender, +distended. Bowel sounds present.  EXTREMITIES: No  edema b/l.    NEUROLOGIC: nonfocal  patient is alert and awake SKIN: No obvious rash, lesion, or ulcer.   LABORATORY PANEL:  CBC Recent Labs  Lab 06/17/22 1259 06/18/22 0030 06/18/22 1210  WBC 5.2  --   --   HGB 5.2*   < > 7.4*  HCT 17.3*   < > 22.5*  PLT 146*  --   --    < > = values in this interval not displayed.    Chemistries  Recent Labs  Lab 06/17/22 1559 06/18/22 0633  NA 137 138  K 4.6 4.8  CL 112* 113*  CO2 20* 22  GLUCOSE 196* 134*  BUN 39* 38*  CREATININE 1.73* 1.61*  CALCIUM 7.8* 8.0*  AST 32  --   ALT 18  --   ALKPHOS 96  --   BILITOT 1.9*  --    Cardiac Enzymes No results for input(s): "TROPONINI" in the last 168 hours. RADIOLOGY:  CT ANGIO GI BLEED  Result Date: 06/17/2022 CLINICAL DATA:  GI bleed. History of cirrhosis and tips. EXAM: CTA ABDOMEN AND PELVIS WITHOUT AND WITH CONTRAST TECHNIQUE: Multidetector CT imaging of the abdomen and pelvis was performed using the standard protocol during bolus administration of intravenous contrast. Multiplanar reconstructed images and MIPs were obtained and reviewed to evaluate the  vascular anatomy. RADIATION DOSE REDUCTION: This exam was performed according to the departmental dose-optimization program which includes automated exposure control, adjustment of the mA and/or kV according to patient size and/or use of iterative reconstruction technique. CONTRAST:  61m OMNIPAQUE IOHEXOL 350 MG/ML SOLN COMPARISON:  CT scan 02/05/2022 FINDINGS: VASCULAR Aorta: Stable atherosclerotic calcifications but no aneurysm or dissection. Celiac: Normal SMA: Minimal ostial calcification. No stenosis, aneurysm or dissection. Renals: Bilateral renal artery calcifications right greater than left but no significant stenosis or aneurysm. IMA: Patent Inflow: Atherosclerotic calcifications but no dissection or significant stenosis. Proximal Outflow: Atherosclerotic calcifications but no stenosis or dissection. Veins: No significant findings. Review of the MIP images confirms the above findings. NON-VASCULAR Lower chest: Stable cardiac enlargement and coronary artery calcifications. No acute pulmonary findings or pleural effusion. Hepatobiliary: Stable cirrhotic changes involving the liver. No worrisome hepatic lesions or intrahepatic biliary dilatation. The tips is in stable position. No complicating features. The gallbladder is filled with stones and milk of calcium and sludge. No common bile duct dilatation. Pancreas: No mass, inflammation or ductal dilatation. Spleen: Stable splenomegaly. Adrenals/Urinary Tract: Adrenal glands and kidneys are unremarkable and stable. The bladder is unremarkable. Stomach/Bowel: The stomach, duodenum, small bowel and colon are grossly normal. No acute inflammatory process, mass lesions or obstructive findings. No obvious GI bleed is identified. Stable  colonic diverticulosis. Lymphatic: No abdominal or pelvic lymphadenopathy. Reproductive: Surgically absent. Other: Moderate volume abdominal/pelvic ascites. There is also diffuse body wall edema consistent with anasarca.  Musculoskeletal: No significant bony findings. IMPRESSION: 1. Stable atherosclerotic calcifications involving the abdominal aorta and branch vessels but no aneurysm or dissection. 2. No active GI bleed is identified. 3. Stable cirrhotic changes involving the liver with associated splenomegaly and moderate volume abdominal/pelvic ascites. Tips shunt is in place and appears patent. 4. Cholelithiasis and milk of calcium and sludge within the gallbladder. 5. Diffuse body wall edema consistent with anasarca. 6. Aortic atherosclerosis. Aortic Atherosclerosis (ICD10-I70.0). Electronically Signed   By: Marijo Sanes M.D.   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 Request is for therapeutic paracentesis. 8 L maximum EXAM: ULTRASOUND GUIDED THERAPEUTIC PARACENTESIS MEDICATIONS: Lidocaine 1% 10 mL COMPLICATIONS: None immediate. PROCEDURE: Informed written consent was obtained from the patient after a discussion of the risks, benefits and alternatives to treatment. A timeout was performed prior to the initiation of the procedure. Initial ultrasound scanning demonstrates a large amount of ascites within the right upper abdominal quadrant. The right lower abdomen was prepped and draped in the usual sterile fashion. 1% lidocaine was used for local anesthesia. Following this, a 19 gauge, 10-cm, Yueh catheter was introduced. An ultrasound image was saved for documentation purposes. The paracentesis was performed. The catheter was removed and a dressing was applied. The patient tolerated the procedure well without immediate post procedural complication. Patient received post-procedure intravenous albumin; see nursing notes for details. FINDINGS: A total of approximately 8 L of straw-colored fluid was removed. IMPRESSION: Successful ultrasound-guided therapeutic paracentesis yielding 8 liters of  peritoneal fluid. Read by: Rushie Nyhan, NP Electronically Signed   By: Corrie Mckusick D.O.   On: 06/17/2022 14:20    Assessment and Plan Rachel Huber is a 66 y.o. female with medical history significant for liver cirrhosis with portal hypertension status post TIPS in May, 2023 from [facility], currently on the transplant list, history of hypertension, diabetes mellitus with stage 3b CKD, depression, obesity, obstructive sleep apnea on CPAP who was sent to the emergency room from specials unit where she had paracentesis done today. She had 8 L of ascitic fluid drained today Patient states that she has had intermittent rectal bleeding but worse on the morning of her admission.  She had to use the bathroom prior to coming to the hospital for her procedure and passed a copious amount of bright red blood with clots.  She had 2 more episodes of bright red blood per rectum while in the specials unit and her gastroenterologist was contacted who ordered a CBC.  Patient's hemoglobin returned at 5.2.     CT angiogram of the abdomen and pelvis showed stable atherosclerotic calcifications involving the abdominal aorta and branch vessels but no aneurysm or dissection. No active GI bleed is identified. Stable cirrhotic changes involving the liver with associated splenomegaly and moderate volume abdominal/pelvic ascites. Tips shunt is in place and appears patent. Cholelithiasis and milk of calcium and sludge within the gallbladder. Diffuse body wall edema consistent with anasarca. Aortic atherosclerosis  * ABLA (acute blood loss anemia), recurrent suspected due to internal hemorrhoids bleeding in the setting of portal hypertension. Patient has had similar issues in the past -- came in with hemoglobin of 5.2--- 4 unit blood transfusion-- 7.4 ---- patient's seen by G.I. Dr. Haig Prophet. Plans  for colonoscopy tomorrow.  She has stage IV hemorrhoids most likely secondary to portal hypertension from her known liver  cirrhosis and continues to have intermittent rectal bleeding with anemia requiring blood transfusion. -- Patient will take Sutab for her bowel prep as per Dr. Drinda Butts recommendation. She cannot tolerate [place]  Rectal bleeding Treatment as outlined in 1   Liver cirrhosis secondary to NASH [facility]) status post DIPs malignant ascites status post weekly paracentesis --Patient has a history of decompensated liver cirrhosis with complications of portal hypertension status post TIPS procedure in May with revision but continues to have significant ascites requiring weekly paracentesis. --10/31-- status post paracentesis  with drainage of 8 L of ascitic fluid. --Currently not on any diuretic therapy due to worsening renal function. -- Patient follows hepatology at [facility] main [facility] and is in the process for evaluation of liver transplant   Depression with anxiety --Continue sertraline   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 mellitus ([place]) --diabetes mellitus with complications of stage III chronic kidney disease --Maintain consistent carbohydrate diet --Glycemic control with sliding scale insulin   Obesity (BMI 30-39.9) --BMI 12.4 --Complicates overall prognosis and care   OSA on CPAP --Secondary to obesity --Continue CPAP at bedtime    Procedures: Family communication : none Consults : G.I. Dr. Haig Prophet CODE STATUS: full DVT Prophylaxis : SCD due to G.I. bleed Level of care: Telemetry Medical Status is: Inpatient Remains inpatient appropriate because: GI bleed w/u    TOTAL TIME TAKING CARE OF THIS PATIENT: 35 minutes.  >50% time spent on counselling and coordination of care  Note: This dictation was prepared with Dragon dictation along with smaller phrase technology. Any transcriptional errors that result from this process are unintentional.  Fritzi Mandes M.D    [facility]   CC: Primary care physician; [name], MD

## 2022-06-18 NOTE — ED Notes (Signed)
Breakfast tray placed at bedside.

## 2022-06-19 ENCOUNTER — Encounter
Admission: EM | Disposition: A | Discharge status: Home / Self Care | Payer: Self-pay | Source: Home / Self Care | Attending: Internal Medicine

## 2022-06-19 ENCOUNTER — Encounter: Payer: Self-pay | Admitting: Internal Medicine

## 2022-06-19 ENCOUNTER — Inpatient Hospital Stay: Payer: 59

## 2022-06-19 ENCOUNTER — Inpatient Hospital Stay: Payer: 59 | Admitting: Anesthesiology

## 2022-06-19 DIAGNOSIS — D62 Acute posthemorrhagic anemia: Secondary | ICD-10-CM | POA: Diagnosis not present

## 2022-06-19 HISTORY — PX: COLONOSCOPY WITH PROPOFOL: SHX5780

## 2022-06-19 LAB — HEMOGLOBIN AND HEMATOCRIT, BLOOD
HCT: 21.4 % — ABNORMAL LOW (ref 36.0–46.0)
HCT: 21.7 % — ABNORMAL LOW (ref 36.0–46.0)
Hemoglobin: 7.1 g/dL — ABNORMAL LOW (ref 12.0–15.0)
Hemoglobin: 7.4 g/dL — ABNORMAL LOW (ref 12.0–15.0)

## 2022-06-19 SURGERY — COLONOSCOPY WITH PROPOFOL
Anesthesia: General

## 2022-06-19 MED ORDER — LIDOCAINE HCL (CARDIAC) PF 100 MG/5ML IV SOSY
PREFILLED_SYRINGE | INTRAVENOUS | Status: DC | PRN
  Administered 2022-06-19: 50 mg via INTRAVENOUS

## 2022-06-19 MED ORDER — PROPOFOL 10 MG/ML IV BOLUS
INTRAVENOUS | Status: DC | PRN
  Administered 2022-06-19: 70 mg via INTRAVENOUS

## 2022-06-19 MED ORDER — PROPOFOL 500 MG/50ML IV EMUL
INTRAVENOUS | Status: DC | PRN
  Administered 2022-06-19: 150 ug/kg/min via INTRAVENOUS

## 2022-06-19 NOTE — Op Note (Signed)
[facility] Patient Name: Rachel Huber Procedure Date: 06/19/2022 12:06 PM MRN: [id number] Account #: [id number] Date of Birth: March 12, 1956 Admit Type: Inpatient Age: 66 Room: [facility] ENDO ROOM 3 Gender: Female Note Status: Finalized Instrument Name: Park Meo [id number] Procedure:             Colonoscopy Indications:           Hematochezia Providers:             Andrey Farmer MD, MD Referring MD:          Ocie Cornfield. Ouida Sills MD, MD (Referring MD), Marney Setting.                         Ouida Sills MD, MD (Referring MD) Medicines:             Monitored Anesthesia Care Complications:         No immediate complications. Procedure:             Pre-Anesthesia Assessment:                        - Prior to the procedure, a History and Physical was                         performed, and patient medications and allergies were                         reviewed. The patient is competent. The risks and                         benefits of the procedure and the sedation options and                         risks were discussed with the patient. All questions                         were answered and informed consent was obtained.                         Patient identification and proposed procedure were                         verified by the physician, the nurse, the                         anesthesiologist, the anesthetist and the technician                         in the endoscopy suite. Mental Status Examination:                         alert and oriented. Airway Examination: normal                         oropharyngeal airway and neck mobility. Respiratory                         Examination: clear to auscultation. CV Examination:  normal. Prophylactic Antibiotics: The patient does not                         require prophylactic antibiotics. Prior                         Anticoagulants: The patient has taken no anticoagulant                          or antiplatelet agents. ASA Grade Assessment: III - A                         patient with severe systemic disease. After reviewing                         the risks and benefits, the patient was deemed in                         satisfactory condition to undergo the procedure. The                         anesthesia plan was to use monitored anesthesia care                         (MAC). Immediately prior to administration of                         medications, the patient was re-assessed for adequacy                         to receive sedatives. The heart rate, respiratory                         rate, oxygen saturations, blood pressure, adequacy of                         pulmonary ventilation, and response to care were                         monitored throughout the procedure. The physical                         status of the patient was re-assessed after the                         procedure.                        After obtaining informed consent, the colonoscope was                         passed under direct vision. Throughout the procedure,                         the patient's blood pressure, pulse, and oxygen                         saturations were monitored continuously. The  Colonoscope was introduced through the anus and                         advanced to the the terminal ileum. The colonoscopy                         was performed without difficulty. The patient                         tolerated the procedure well. The quality of the bowel                         preparation was good. The terminal ileum, ileocecal                         valve, appendiceal orifice, and rectum were                         photographed. Findings:      Hemorrhoids were found on perianal exam.      The terminal ileum appeared normal.      An area of significantly congested mucosa was found in the rectum, in       the recto-sigmoid colon and in the sigmoid colon.       External and internal hemorrhoids were found during retroflexion and       during perianal exam. The hemorrhoids were Grade IV (internal       hemorrhoids that prolapse and cannot be reduced manually). Impression:            - Hemorrhoids found on perianal exam.                        - The examined portion of the ileum was normal.                        - Congested mucosa in the rectum, in the recto-sigmoid                         colon and in the sigmoid colon.                        - External and internal hemorrhoids.                        - No specimens collected. Recommendation:        - Repeat colonoscopy in 10 years for screening                         purposes.                        - Return patient to hospital ward for ongoing care.                        - Advance diet as tolerated.                        - Continue present medications. Procedure Code(s):     --- Professional ---  45378, Colonoscopy, flexible; diagnostic, including                         collection of specimen(s) by brushing or washing, when                         performed (separate procedure) Diagnosis Code(s):     --- Professional ---                        K64.3, Fourth degree hemorrhoids                        K62.89, Other specified diseases of anus and rectum                        K63.89, Other specified diseases of intestine                        K92.1, Melena (includes Hematochezia) CPT copyright 2022 American Medical Association. All rights reserved. The codes documented in this report are preliminary and upon coder review may  be revised to meet current compliance requirements. Andrey Farmer MD, MD 06/19/2022 3:03:19 PM Number of Addenda: 0 Note Initiated On: 06/19/2022 12:06 PM Scope Withdrawal Time: 0 hours 9 minutes 51 seconds  Total Procedure Duration: 0 hours 14 minutes 59 seconds  Estimated Blood Loss:  Estimated blood loss: none.      [facility]

## 2022-06-19 NOTE — Anesthesia Postprocedure Evaluation (Signed)
Anesthesia Post Note  Patient: Rachel Huber  Procedure(s) Performed: COLONOSCOPY WITH PROPOFOL  Patient location during evaluation: PACU Anesthesia Type: General Level of consciousness: awake and alert, oriented and patient cooperative Pain management: pain level controlled Vital Signs Assessment: post-procedure vital signs reviewed and stable Respiratory status: spontaneous breathing, nonlabored ventilation and respiratory function stable Cardiovascular status: blood pressure returned to baseline and stable Postop Assessment: adequate PO intake Anesthetic complications: no   No notable events documented.   Last Vitals:  Vitals:   06/19/22 1457 06/19/22 1507  BP:  (!) 115/48  Pulse: 85   Resp: 19   Temp:    SpO2: 100%     Last Pain:  Vitals:   06/19/22 1507  TempSrc:   PainSc: 0-No pain                 Darrin Nipper

## 2022-06-19 NOTE — Plan of Care (Signed)

## 2022-06-19 NOTE — Anesthesia Preprocedure Evaluation (Addendum)
Anesthesia Evaluation  Patient identified by MRN, date of birth, ID band Patient awake    Reviewed: Allergy & Precautions, NPO status , Patient's Chart, lab work & pertinent test results  History of Anesthesia Complications Negative for: history of anesthetic complications  Airway Mallampati: IV   Neck ROM: Full    Dental  (+) Missing Crowns :   Pulmonary sleep apnea and Continuous Positive Airway Pressure Ventilation    Pulmonary exam normal breath sounds clear to auscultation       Cardiovascular hypertension, Normal cardiovascular exam Rhythm:Regular Rate:Normal  ECG 02/04/22:  Accelerated junctional rhythm Low voltage, extremity and precordial leads Prolonged QT interval  Echo 11/15/21:  NORMAL LEFT VENTRICULAR SYSTOLIC FUNCTION WITH MILD LVH  NORMAL LA PRESSURESWITH NORMAL DIASTOLIC FUNCTION  NORMAL RIGHT VENTRICULAR SYSTOLIC FUNCTION  VALVULAR REGURGITATION: TRIVIAL MR, TRIVIAL PR, TRIVIAL TR  NO VALVULAR STENOSIS  NEGATIVE SALINE MICROCAVITATION STUDY AT REST AND AFTER VALSALVA  INSUFFICIENT TR TO ESTIMATE RVSP  ASCITES AND PLEURAL EFFUSION PRESENT  3D acquisition and reconstructions were performed as part of this examination to more accurately quantify the effects of identified structural abnormalities as part of the exam. (post-processing on an Independent workstation).  Compared with prior Echo study on 07/23/2021: NO SIGNIFICANT CHANGES   Neuro/Psych  PSYCHIATRIC DISORDERS Anxiety Depression     Neuromuscular disease (neuropathy)    GI/Hepatic ,,,(+) Cirrhosis   ascites    , Hepatitis -History of NASH cirrhosis complicated by refractory ascites s/p TIPS but still requires weekly paracentesis Hematochezia   Endo/Other  diabetes, Type 2  Obesity   Renal/GU Renal disease (stage III CKD)     Musculoskeletal  (+) Arthritis ,    Abdominal   Peds  Hematology  (+) Blood dyscrasia,  anemia   Anesthesia Other Findings Hemoglobin 5.1, improved to 7.4 s/p 4 units pRBCs  Reproductive/Obstetrics                             Anesthesia Physical Anesthesia Plan  ASA: 3  Anesthesia Plan: General   Post-op Pain Management:    Induction: Intravenous  PONV Risk Score and Plan: 3 and Propofol infusion, TIVA and Treatment may vary due to age or medical condition  Airway Management Planned: Natural Airway and Nasal Cannula  Additional Equipment:   Intra-op Plan:   Post-operative Plan:   Informed Consent: I have reviewed the patients History and Physical, chart, labs and discussed the procedure including the risks, benefits and alternatives for the proposed anesthesia with the patient or authorized representative who has indicated his/her understanding and acceptance.       Plan Discussed with: CRNA  Anesthesia Plan Comments: (LMA/GETA backup discussed.  Patient consented for risks of anesthesia including but not limited to:  - adverse reactions to medications - damage to eyes, teeth, lips or other oral mucosa - nerve damage due to positioning  - sore throat or hoarseness - damage to heart, brain, nerves, lungs, other parts of body or loss of life  Informed patient about role of CRNA in peri- and intra-operative care.  Patient voiced understanding.)        Anesthesia Quick Evaluation

## 2022-06-19 NOTE — Transfer of Care (Signed)
Immediate Anesthesia Transfer of Care Note  Patient: Rachel Huber  Procedure(s) Performed: COLONOSCOPY WITH PROPOFOL  Patient Location: PACU and Endoscopy Unit  Anesthesia Type:General  Level of Consciousness: awake  Airway & Oxygen Therapy: Patient Spontanous Breathing  Post-op Assessment: Report given to RN and Post -op Vital signs reviewed and stable  Post vital signs: Reviewed and stable  Last Vitals:  Vitals Value Taken Time  BP 110/54 06/19/22 1447  Temp    Pulse 88 06/19/22 1447  Resp 16 06/19/22 1447  SpO2 100 % 06/19/22 1447  Vitals shown include unvalidated device data.  Last Pain:  Vitals:   06/19/22 1319  TempSrc: Temporal  PainSc: 0-No pain         Complications: No notable events documented.

## 2022-06-20 ENCOUNTER — Encounter: Admission: RE | Payer: Self-pay | Source: Home / Self Care

## 2022-06-20 ENCOUNTER — Encounter (HOSPITAL_COMMUNITY): Payer: 59

## 2022-06-20 ENCOUNTER — Encounter: Payer: Self-pay | Admitting: Gastroenterology

## 2022-06-20 ENCOUNTER — Ambulatory Visit: Admission: RE | Admit: 2022-06-20 | Payer: 59 | Source: Home / Self Care

## 2022-06-20 DIAGNOSIS — D62 Acute posthemorrhagic anemia: Secondary | ICD-10-CM | POA: Diagnosis not present

## 2022-06-20 LAB — CBC WITH DIFFERENTIAL/PLATELET
Abs Immature Granulocytes: 0.01 10*3/uL (ref 0.00–0.07)
Basophils Absolute: 0 10*3/uL (ref 0.0–0.1)
Basophils Relative: 1 %
Eosinophils Absolute: 0.2 10*3/uL (ref 0.0–0.5)
Eosinophils Relative: 5 %
HCT: 20.2 % — ABNORMAL LOW (ref 36.0–46.0)
Hemoglobin: 6.7 g/dL — ABNORMAL LOW (ref 12.0–15.0)
Immature Granulocytes: 0 %
Lymphocytes Relative: 24 %
Lymphs Abs: 0.9 10*3/uL (ref 0.7–4.0)
MCH: 27 pg (ref 26.0–34.0)
MCHC: 33.2 g/dL (ref 30.0–36.0)
MCV: 81.5 fL (ref 80.0–100.0)
Monocytes Absolute: 0.4 10*3/uL (ref 0.1–1.0)
Monocytes Relative: 11 %
Neutro Abs: 2.1 10*3/uL (ref 1.7–7.7)
Neutrophils Relative %: 59 %
Platelets: 111 10*3/uL — ABNORMAL LOW (ref 150–400)
RBC: 2.48 MIL/uL — ABNORMAL LOW (ref 3.87–5.11)
RDW: 19.6 % — ABNORMAL HIGH (ref 11.5–15.5)
WBC: 3.5 10*3/uL — ABNORMAL LOW (ref 4.0–10.5)
nRBC: 0.6 % — ABNORMAL HIGH (ref 0.0–0.2)

## 2022-06-20 LAB — COMPREHENSIVE METABOLIC PANEL
ALT: 22 U/L (ref 0–44)
AST: 44 U/L — ABNORMAL HIGH (ref 15–41)
Albumin: 2.1 g/dL — ABNORMAL LOW (ref 3.5–5.0)
Alkaline Phosphatase: 84 U/L (ref 38–126)
Anion gap: 5 (ref 5–15)
BUN: 33 mg/dL — ABNORMAL HIGH (ref 8–23)
CO2: 22 mmol/L (ref 22–32)
Calcium: 8.1 mg/dL — ABNORMAL LOW (ref 8.9–10.3)
Chloride: 115 mmol/L — ABNORMAL HIGH (ref 98–111)
Creatinine, Ser: 1.56 mg/dL — ABNORMAL HIGH (ref 0.44–1.00)
GFR, Estimated: 37 mL/min — ABNORMAL LOW (ref >60)
Glucose, Bld: 161 mg/dL — ABNORMAL HIGH (ref 70–99)
Potassium: 3.3 mmol/L — ABNORMAL LOW (ref 3.5–5.1)
Sodium: 142 mmol/L (ref 135–145)
Total Bilirubin: 1.6 mg/dL — ABNORMAL HIGH (ref 0.3–1.2)
Total Protein: 3.8 g/dL — ABNORMAL LOW (ref 6.5–8.1)

## 2022-06-20 LAB — HEMOGLOBIN AND HEMATOCRIT, BLOOD
HCT: 23.6 % — ABNORMAL LOW (ref 36.0–46.0)
Hemoglobin: 7.8 g/dL — ABNORMAL LOW (ref 12.0–15.0)

## 2022-06-20 LAB — PROTIME-INR
INR: 1.3 — ABNORMAL HIGH (ref 0.8–1.2)
Prothrombin Time: 16.2 seconds — ABNORMAL HIGH (ref 11.4–15.2)

## 2022-06-20 LAB — PREPARE RBC (CROSSMATCH)

## 2022-06-20 SURGERY — COLONOSCOPY WITH PROPOFOL
Anesthesia: General

## 2022-06-20 MED ORDER — HYDROCORTISONE ACETATE 25 MG RE SUPP: 25.0000 mg | Freq: Two times a day (BID) | RECTAL | 1 refills | Status: DC | PRN

## 2022-06-20 MED ORDER — HEPARIN SOD (PORK) LOCK FLUSH 100 UNIT/ML IV SOLN
500.0000 [IU] | Freq: Once | INTRAVENOUS | Status: AC
  Administered 2022-06-20: 500 [IU] via INTRAVENOUS
  Filled 2022-06-20: qty 5

## 2022-06-20 MED ORDER — POLYETHYLENE GLYCOL 3350 17 G PO PACK: 17.0000 g | PACK | Freq: Two times a day (BID) | ORAL | 0 refills | Status: DC

## 2022-06-20 MED ORDER — SODIUM CHLORIDE 0.9% IV SOLUTION: Freq: Once | INTRAVENOUS | Status: AC

## 2022-06-20 MED ORDER — DIPHENHYDRAMINE HCL 25 MG PO CAPS
25.0000 mg | ORAL_CAPSULE | Freq: Once | ORAL | Status: AC
  Administered 2022-06-20: 25 mg via ORAL
  Filled 2022-06-20: qty 1

## 2022-06-20 MED ORDER — ATORVASTATIN CALCIUM 10 MG PO TABS: 10.0000 mg | ORAL_TABLET | Freq: Every day | ORAL | 2 refills | Status: DC

## 2022-06-20 MED ORDER — HEPARIN SOD (PORK) LOCK FLUSH 10 UNIT/ML IV SOLN
3.0000 [IU] | Freq: Once | INTRAVENOUS | Status: DC
  Filled 2022-06-20: qty 1

## 2022-06-20 NOTE — Progress Notes (Signed)
Rachel Huber is a 67 yo female with PMH significant for liver cirrhosis with portal hypertension s/p TIPS procedure in May 2023 with revision on 03/12/22, recurrent ascites post-TIPS, and recurrent symptomatic hemorrhoids with associated anemia requiring transfusions s/p superior rectal artery embolization 02/07/22. The patient was admitted to [facility] on 06/17/22 with anemia following rectal bleeding. She receives blood transfusions regularly for anemia.   The patient underwent colonoscopy on 06/19/22 with Dr Haig Prophet which did not find an active GI bleed at the time of the procedure. External and internal hemorrhoids were noted on the perianal exam. Following the procedure, a discussion was held between Dr Haig Prophet with GI and Dr Serafina Royals, where the possibility of a repeat mesenteric angiogram with possible rectal artery embolization was discussed. Per this discussion, it was decided to talk with the patient and determine her interest in this procedure being scheduled as an outpatient at [facility].  The patient was seen today to discuss the above. She was somnolent at the time of the visit, but she was oriented x3. After reviewing her options with the her, the patient stated she wished to proceed with scheduling a mesenteric angiogram with possible rectal artery embolization with Dr Serafina Royals as an outpatient procedure at [facility]. The patient was not examined at the time of the visit today, which was focused only on discussing the patient's interest in the procedure.  Lura Em, PA-C 06/20/2022 12:20 PM

## 2022-06-20 NOTE — Progress Notes (Signed)
[place] [facility] [place] NAME: Rachel Huber    MR#:  [id number]  DATE OF BIRTH:  04/27/1956  SUBJECTIVE:  no family at bedside h Patient came in for her routine paracentesis and eternal left of right red blood per rectum. Hemoglobin was down to 5.0 tolerating PO diet. VITALS:  Blood pressure (!) 111/51, pulse 77, temperature 98.1 F (36.7 C), temperature source Oral, resp. rate 19, height 5' 10"  (1.778 m), weight 113 kg, SpO2 99 %.  PHYSICAL EXAMINATION:   GENERAL:  66 y.o.-year-old patient lying in the bed with no acute distress. Pallor+ obesity LUNGS: Normal breath sounds bilaterally, no wheezing CARDIOVASCULAR: S1, S2 normal. No murmurs,   ABDOMEN: Soft, nontender, +distended. Bowel sounds present.  EXTREMITIES: No  edema b/l.    NEUROLOGIC: nonfocal  patient is alert and awake SKIN: No obvious rash, lesion, or ulcer.   LABORATORY PANEL:  CBC Recent Labs  Lab 06/20/22 0626 06/20/22 1355  WBC 3.5*  --   HGB 6.7* 7.8*  HCT 20.2* 23.6*  PLT 111*  --      Chemistries  Recent Labs  Lab 06/20/22 0626  NA 142  K 3.3*  CL 115*  CO2 22  GLUCOSE 161*  BUN 33*  CREATININE 1.56*  CALCIUM 8.1*  AST 44*  ALT 22  ALKPHOS 84  BILITOT 1.6*     Assessment and Plan Rachel Huber is a 66 y.o. female with medical history significant for liver cirrhosis with portal hypertension status post TIPS in May, 2023 from [place], currently on the transplant list, history of hypertension, diabetes mellitus with stage 3b CKD, depression, obesity, obstructive sleep apnea on CPAP who was sent to the emergency room from specials unit where she had paracentesis done today. She had 8 L of ascitic fluid drained today Patient states that she has had intermittent rectal bleeding but worse on the morning of her admission.  She had to use the bathroom prior to coming to the hospital for her procedure and passed a copious amount of bright red blood with clots.   She had 2 more episodes of bright red blood per rectum while in the specials unit and her gastroenterologist was contacted who ordered a CBC.  Patient's hemoglobin returned at 5.2.     CT angiogram of the abdomen and pelvis showed stable atherosclerotic calcifications involving the abdominal aorta and branch vessels but no aneurysm or dissection. No active GI bleed is identified. Stable cirrhotic changes involving the liver with associated splenomegaly and moderate volume abdominal/pelvic ascites. Tips shunt is in place and appears patent. Cholelithiasis and milk of calcium and sludge within the gallbladder. Diffuse body wall edema consistent with anasarca. Aortic atherosclerosis   ABLA (acute blood loss anemia), recurrent suspected due to internal hemorrhoids bleeding in the setting of portal hypertension. Patient has had similar issues in the past -- came in with hemoglobin of 5.2--- 5 unit blood transfusion-- 7.8 ---- patient's seen by G.I. Dr. Haig Huber. Plans for colonoscopy tomorrow.  She has stage IV hemorrhoids most likely secondary to portal hypertension from her known liver cirrhosis and continues to have intermittent rectal bleeding with anemia requiring blood transfusion. -- ColonoscopyHemorrhoids were found on perianal exam.      The terminal ileum appeared normal.      An area of significantly congested mucosa was found in the rectum, in       the recto-sigmoid colon and in the sigmoid colon.  External and internal hemorrhoids were found during retroflexion and       during perianal exam. The hemorrhoids were Grade IV (internal       hemorrhoids that prolapse and cannot be reduced manually). -- Patient will be scheduled to get a concentric angiogram with possible rectal artery embolization with Dr. Serafina Royals as outpatient procedure at [facility]  Liver cirrhosis secondary to [facility]) status post DIPs malignant ascites status post weekly paracentesis --Patient has a history of  decompensated liver cirrhosis with complications of portal hypertension status post TIPS procedure in May with revision but continues to have significant ascites requiring weekly paracentesis. --10/31-- status post paracentesis  with drainage of 8 L of ascitic fluid. --Currently not on any diuretic therapy due to worsening renal function. -- Patient follows hepatology at [facility] main campus and is in the process for evaluation of liver transplant   Depression with anxiety --Continue sertraline   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 mellitus ([place]) --diabetes mellitus with complications of stage III chronic kidney disease --Maintain consistent carbohydrate diet --Glycemic control with sliding scale insulin   Obesity (BMI 30-39.9) --BMI 15.8 --Complicates overall prognosis and care   OSA on CPAP --Secondary to obesity --Continue CPAP at bedtime   Procedures: colonoscopy Family communication : none Consults : G.I. Dr. Haig Huber CODE STATUS: full DVT Prophylaxis : SCD due to G.I. bleed Level of care: Telemetry Medical Status is: Inpatient Remains inpatient appropriate because: GI bleed w/u. Monitor for one more night and check hemoglobin morning. Transfuse as needed.    TOTAL TIME TAKING CARE OF THIS PATIENT: 35 minutes.  >50% time spent on counselling and coordination of care  Note: This dictation was prepared with Dragon dictation along with smaller phrase technology. Any transcriptional errors that result from this process are unintentional.  Fritzi Mandes M.D    [facility]   CC: Primary care physician; [name], MD

## 2022-06-20 NOTE — Discharge Summary (Signed)
Physician Discharge Summary   Patient: Rachel Huber MRN: [id number] DOB: [birth year]  Admit date:     06/17/2022  Discharge date: 06/20/22  Discharge Physician: Fritzi Mandes   PCP: [name], MD   Recommendations at discharge:   patient will follow-up with Dr. Haig Prophet regarding her cirrhosis/G.I. bleed. She will be scheduled by interventional radiology to get her embolization procedure for internal hemorrhoids be done at a later date. Resume your weekly lab draws and paracentesis as per your schedule.  Discharge Diagnoses: Principal Problem:   ABLA (acute blood loss anemia) Active Problems:   Rectal bleeding   Liver cirrhosis secondary to NASH (HCC)   Depression with anxiety   OSA on CPAP   Obesity (BMI 30-39.9)   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 mellitus (Henry)   GI bleed  Hospital Course:     Rachel Huber is a 66 y.o. female with medical history significant for liver cirrhosis with portal hypertension status post TIPS in May, 2023 from [facility], currently on the transplant list, history of hypertension, diabetes mellitus with stage 3b CKD, depression, obesity, obstructive sleep apnea on CPAP who was sent to the emergency room from specials unit where she had paracentesis done today. She had 8 L of ascitic fluid drained today Patient states that she has had intermittent rectal bleeding but worse on the morning of her admission.  She had to use the bathroom prior to coming to the hospital for her procedure and passed a copious amount of bright red blood with clots.  She had 2 more episodes of bright red blood per rectum while in the specials unit and her gastroenterologist was contacted who ordered a CBC.  Patient's hemoglobin returned at 5.2.       CT angiogram of the abdomen and pelvis showed stable atherosclerotic calcifications involving the abdominal aorta and branch vessels but no aneurysm or dissection. No active GI bleed is identified. Stable cirrhotic changes  involving the liver with associated splenomegaly and moderate volume abdominal/pelvic ascites. Tips shunt is in place and appears patent. Cholelithiasis and milk of calcium and sludge within the gallbladder. Diffuse body wall edema consistent with anasarca. Aortic atherosclerosis   * ABLA (acute blood loss anemia), recurrent suspected due to internal hemorrhoids bleeding in the setting of portal hypertension. Patient has had similar issues in the past -- came in with hemoglobin of 5.2--- 5 unit blood transfusion-- 7.8 ---- patient's seen by G.I. Dr. Haig Prophet. Plans for colonoscopy tomorrow.  She has stage IV hemorrhoids most likely secondary to portal hypertension from her known liver cirrhosis and continues to have intermittent rectal bleeding with anemia requiring blood transfusion. -- ColonoscopyHemorrhoids were found on perianal exam.      The terminal ileum appeared normal.      An area of significantly congested mucosa was found in the rectum, in       the recto-sigmoid colon and in the sigmoid colon.      External and internal hemorrhoids were found during retroflexion and       during perianal exam. The hemorrhoids were Grade IV (internal       hemorrhoids that prolapse and cannot be reduced manually). -- Patient will be scheduled to get a concentric angiogram with possible rectal artery embolization with Dr. Serafina Royals as outpatient procedure at [facility]  Liver cirrhosis secondary to NASH [facility]) status post DIPs malignant ascites status post weekly paracentesis --Patient has a history of decompensated liver cirrhosis with complications of portal hypertension  status post TIPS procedure in May with revision but continues to have significant ascites requiring weekly paracentesis. --10/31-- status post paracentesis  with drainage of 8 L of ascitic fluid. --Currently not on any diuretic therapy due to worsening renal function. -- Patient follows hepatology at [facility] main campus and is in the  process for evaluation of liver transplant   Depression with anxiety --Continue sertraline   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 mellitus ([place]) --diabetes mellitus with complications of stage III chronic kidney disease --Maintain consistent carbohydrate diet --Glycemic control with sliding scale insulin   Obesity (BMI 30-39.9) --BMI 34.7 --Complicates overall prognosis and care   OSA on CPAP --Secondary to obesity --Continue CPAP at bedtime  no further G.I. workup recommended. Okay from G.I. standpoint for discharge. Patient's hemoglobin is 7.8.   Procedures: colonoscopy Family communication : none Consults : G.I. Dr. Haig Prophet CODE STATUS: full DVT Prophylaxis : SCD due to G.I. bleed   Disposition: Home Diet recommendation:  Discharge Diet Orders (From admission, onward)     Start     Ordered   06/20/22 0000  Diet - low sodium heart healthy        06/20/22 1452           Cardiac diet DISCHARGE MEDICATION: Allergies as of 06/20/2022       Reactions   Gramineae Pollens Other (See Comments)   Sneezing, Running nose   Latex Rash   Contact rash        Medication List     STOP taking these medications    amoxicillin 500 MG capsule Commonly known as: AMOXIL   sodium chloride 0.65 % Soln nasal spray Commonly known as: OCEAN       TAKE these medications    acetaminophen 650 MG CR tablet Commonly known as: TYLENOL Take 1,300 mg by mouth daily as needed for pain.   albuterol 108 (90 Base) MCG/ACT inhaler Commonly known as: VENTOLIN HFA Inhale 2 puffs into the lungs every 6 (six) hours as needed for wheezing or shortness of breath.   atorvastatin 10 MG tablet Commonly known as: LIPITOR Take 1 tablet (10 mg total) by mouth daily.   cyanocobalamin 1000 MCG tablet Commonly known as: VITAMIN B12 Take 1,000 mcg by mouth daily.   diphenhydrAMINE 25 MG tablet Commonly known as: BENADRYL Take 25 mg by mouth 2 (two) times daily as needed for itching.    ergocalciferol 1.25 MG (50000 UT) capsule Commonly known as: VITAMIN D2 Take 1 capsule (50,000 Units total) by mouth once a week.   gabapentin 400 MG capsule Commonly known as: NEURONTIN Take 800 mg by mouth 2 (two) times daily.   hydrocortisone 2.5 % rectal cream Commonly known as: ANUSOL-HC Place rectally 4 (four) times daily.   hydrocortisone 25 MG suppository Commonly known as: Anucort-HC Place 1 suppository (25 mg total) rectally 2 (two) times daily as needed for anal itching or hemorrhoids.   lactulose (encephalopathy) 10 GM/15ML Soln Commonly known as: CHRONULAC Take 30 mLs (20 g total) by mouth daily.   melatonin 5 MG Tabs Take 10 mg by mouth at bedtime as needed (sleep).   multivitamin with minerals tablet Take 1 tablet by mouth daily.   polyethylene glycol 17 g packet Commonly known as: MIRALAX / GLYCOLAX Take 17 g by mouth 2 (two) times daily.   sertraline 50 MG tablet Commonly known as: ZOLOFT Take 50 mg by mouth daily.   witch hazel-glycerin pad Commonly known as: TUCKS Apply topically as  needed for hemorrhoids.        Follow-up Information     [name], MD. Schedule an appointment as soon as possible for a visit in 1 week(s).   Specialty: Internal Medicine Contact information: [place] [address] [phone]         Lesly Rubenstein, MD. Schedule an appointment as soon as possible for a visit in 1 week(s).   Specialty: Gastroenterology Why: GI bleed/cirrhosis Contact information: [place] [address] [phone]                Discharge Exam: [name] Weights   06/17/22 1504 06/19/22 1319  Weight: 113 kg 113 kg     Condition at discharge: fair  The results of significant diagnostics from this hospitalization (including imaging, microbiology, ancillary and laboratory) are listed below for reference.   Imaging Studies: CT ANGIO GI  BLEED  Result Date: 06/17/2022 CLINICAL DATA:  GI bleed. History of cirrhosis and tips. EXAM: CTA ABDOMEN AND PELVIS WITHOUT AND WITH CONTRAST TECHNIQUE: Multidetector CT imaging of the abdomen and pelvis was performed using the standard protocol during bolus administration of intravenous contrast. Multiplanar reconstructed images and MIPs were obtained and reviewed to evaluate the vascular anatomy. RADIATION DOSE REDUCTION: This exam was performed according to the departmental dose-optimization program which includes automated exposure control, adjustment of the mA and/or kV according to patient size and/or use of iterative reconstruction technique. CONTRAST:  7m OMNIPAQUE IOHEXOL 350 MG/ML SOLN COMPARISON:  CT scan 02/05/2022 FINDINGS: VASCULAR Aorta: Stable atherosclerotic calcifications but no aneurysm or dissection. Celiac: Normal SMA: Minimal ostial calcification. No stenosis, aneurysm or dissection. Renals: Bilateral renal artery calcifications right greater than left but no significant stenosis or aneurysm. IMA: Patent Inflow: Atherosclerotic calcifications but no dissection or significant stenosis. Proximal Outflow: Atherosclerotic calcifications but no stenosis or dissection. Veins: No significant findings. Review of the MIP images confirms the above findings. NON-VASCULAR Lower chest: Stable cardiac enlargement and coronary artery calcifications. No acute pulmonary findings or pleural effusion. Hepatobiliary: Stable cirrhotic changes involving the liver. No worrisome hepatic lesions or intrahepatic biliary dilatation. The tips is in stable position. No complicating features. The gallbladder is filled with stones and milk of calcium and sludge. No common bile duct dilatation. Pancreas: No mass, inflammation or ductal dilatation. Spleen: Stable splenomegaly. Adrenals/Urinary Tract: Adrenal glands and kidneys are unremarkable and stable. The bladder is unremarkable. Stomach/Bowel: The stomach, duodenum,  small bowel and colon are grossly normal. No acute inflammatory process, mass lesions or obstructive findings. No obvious GI bleed is identified. Stable colonic diverticulosis. Lymphatic: No abdominal or pelvic lymphadenopathy. Reproductive: Surgically absent. Other: Moderate volume abdominal/pelvic ascites. There is also diffuse body wall edema consistent with anasarca. Musculoskeletal: No significant bony findings. IMPRESSION: 1. Stable atherosclerotic calcifications involving the abdominal aorta and branch vessels but no aneurysm or dissection. 2. No active GI bleed is identified. 3. Stable cirrhotic changes involving the liver with associated splenomegaly and moderate volume abdominal/pelvic ascites. Tips shunt is in place and appears patent. 4. Cholelithiasis and milk of calcium and sludge within the gallbladder. 5. Diffuse body wall edema consistent with anasarca. 6. Aortic atherosclerosis. Aortic Atherosclerosis (ICD10-I70.0). Electronically Signed   By: PMarijo SanesM.D.   On: 06/17/2022 17:44   UKoreaParacentesis  Result Date: 06/17/2022 INDICATION: Patient with history of NASH cirrhosis status post tips and tips revision with recurrent ascites. Request is for therapeutic paracentesis. 8 L maximum EXAM: ULTRASOUND GUIDED  THERAPEUTIC PARACENTESIS MEDICATIONS: Lidocaine 1% 10 mL COMPLICATIONS: None immediate. PROCEDURE: Informed written consent was obtained from the patient after a discussion of the risks, benefits and alternatives to treatment. A timeout was performed prior to the initiation of the procedure. Initial ultrasound scanning demonstrates a large amount of ascites within the right upper abdominal quadrant. The right lower abdomen was prepped and draped in the usual sterile fashion. 1% lidocaine was used for local anesthesia. Following this, a 19 gauge, 10-cm, Yueh catheter was introduced. An ultrasound image was saved for documentation purposes. The paracentesis was performed. The catheter was  removed and a dressing was applied. The patient tolerated the procedure well without immediate post procedural complication. Patient received post-procedure intravenous albumin; see nursing notes for details. FINDINGS: A total of approximately 8 L of straw-colored fluid was removed. IMPRESSION: Successful ultrasound-guided therapeutic paracentesis yielding 8 liters of peritoneal fluid. Read by: Rushie Nyhan, NP Electronically Signed   By: Corrie Mckusick D.O.   On: 06/17/2022 14:20   IR IMAGING GUIDED PORT INSERTION  Result Date: 06/13/2022 INDICATION: 66 year old with symptomatic anemia and frequent blood transfusions. Patient has poor venous access and needs a Port-A-Cath. EXAM: FLUOROSCOPIC AND ULTRASOUND GUIDED PLACEMENT OF A SUBCUTANEOUS PORT COMPARISON:  None Available. MEDICATIONS: Moderate sedation ANESTHESIA/SEDATION: Moderate (conscious) sedation was employed during this procedure. A total of Versed 1.0 mg and fentanyl 50 mcg was administered intravenously at the order of the provider performing the procedure. Total intra-service moderate sedation time: 31 minutes. Patient's level of consciousness and vital signs were monitored continuously by radiology nurse throughout the procedure under the supervision of the provider performing the procedure. FLUOROSCOPY TIME:  Radiation Exposure Index (as provided by the fluoroscopic device): 5 mGy Kerma COMPLICATIONS: None immediate. PROCEDURE: The procedure, risks, benefits, and alternatives were explained to the patient. Questions regarding the procedure were encouraged and answered. The patient understands and consents to the procedure. Patient was placed supine on the interventional table. Ultrasound confirmed a patent right internal jugular vein. Ultrasound image was saved for documentation. The right chest and neck were cleaned with a skin antiseptic and a sterile drape was placed. Maximal barrier sterile technique was utilized including caps, mask,  sterile gowns, sterile gloves, sterile drape, hand hygiene and skin antiseptic. The right neck was anesthetized with 1% lidocaine. Small incision was made in the right neck with a blade. Micropuncture set was placed in the right internal jugular vein with ultrasound guidance. The micropuncture wire was used for measurement purposes. The right chest was anesthetized with 1% lidocaine with epinephrine. #15 blade was used to make an incision and a subcutaneous port pocket was formed. [place] was assembled. Subcutaneous tunnel was formed with a stiff tunneling device. The port catheter was brought through the subcutaneous tunnel. The port was placed in the subcutaneous pocket. The micropuncture set was exchanged for a peel-away sheath. The catheter was placed through the peel-away sheath and the tip was positioned at the superior cavoatrial junction. Catheter placement was confirmed with fluoroscopy. The port was accessed and flushed with heparinized saline. The port pocket was closed using two layers of absorbable sutures and Dermabond. The vein skin site was closed using a single layer of absorbable suture and Dermabond. Sterile dressings were applied. Patient tolerated the procedure well without an immediate complication. Ultrasound and fluoroscopic images were taken and saved for this procedure. IMPRESSION: Placement of a subcutaneous power-injectable port device. Catheter tip at the superior cavoatrial junction. Electronically Signed   By: Markus Daft  M.D.   On: 06/13/2022 18:31   US Paracentesis  Result Date: 06/10/2022 INDICATION: Patient history of NASH cirrhosis status post tips and tips revision with recurrent ascites. Request is for therapeutic paracentesis EXAM: ULTRASOUND GUIDED THERAPEUTIC PARACENTESIS MEDICATIONS: Lidocaine 1% 10 mL COMPLICATIONS: None immediate. PROCEDURE: Informed written consent was obtained from the patient after a discussion of the risks, benefits and alternatives to  treatment. A timeout was performed prior to the initiation of the procedure. Initial ultrasound scanning demonstrates a large amount of ascites within the right lower abdominal quadrant. The right lower abdomen was prepped and draped in the usual sterile fashion. 1% lidocaine was used for local anesthesia. Following this, a 19 gauge, 10-cm, Yueh catheter was introduced. An ultrasound image was saved for documentation purposes. The paracentesis was performed. The catheter was removed and a dressing was applied. The patient tolerated the procedure well without immediate post procedural complication. FINDINGS: A total of approximately 8 L of straw-colored fluid was removed. IMPRESSION: Successful ultrasound-guided therapeutic paracentesis yielding 8 L of peritoneal fluid. Read by: Rushie Nyhan, NP Electronically Signed   By: Michaelle Birks M.D.   On: 06/10/2022 13:25   US Paracentesis  Result Date: 06/03/2022 INDICATION: NASH cirrhosis status post tips and tips revision with recurrent ascites EXAM: ULTRASOUND GUIDED therapeutic right lower quadrant PARACENTESIS MEDICATIONS: 20 cc 1% lidocaine COMPLICATIONS: None immediate. PROCEDURE: Informed written consent was obtained from the patient after a discussion of the risks, benefits and alternatives to treatment. A timeout was performed prior to the initiation of the procedure. Initial ultrasound scanning demonstrates a large amount of ascites within the right lower abdominal quadrant. The right lower abdomen was prepped and draped in the usual sterile fashion. 1% lidocaine was used for local anesthesia. Following this, a 19 gauge, 10-cm, Yueh catheter was introduced. An ultrasound image was saved for documentation purposes. The paracentesis was performed. The catheter was removed and a dressing was applied. The patient tolerated the procedure well without immediate post procedural complication. Patient received post-procedure intravenous albumin; see nursing  notes for details. FINDINGS: A total of approximately 8 L of yellow fluid was removed. Ordering provider did not request laboratory samples IMPRESSION: Successful ultrasound-guided paracentesis yielding 8 liters of peritoneal fluid. Read by: Reatha Armour, PA-C Electronically Signed   By: Ruthann Cancer M.D.   On: 06/03/2022 14:19   US Paracentesis  Result Date: 05/27/2022 INDICATION: NASH cirrhosis s/p TIPS and TIPS revision with recurrent ascites EXAM: ULTRASOUND GUIDED RLQ PARACENTESIS MEDICATIONS: 15cc 1% Lidocaine COMPLICATIONS: None immediate. PROCEDURE: Informed written consent was obtained from the patient after a discussion of the risks, benefits and alternatives to treatment. A timeout was performed prior to the initiation of the procedure. Initial ultrasound scanning demonstrates a large amount of ascites within the right lower abdominal quadrant. The right lower abdomen was prepped and draped in the usual sterile fashion. 1% lidocaine was used for local anesthesia. Following this, a 19 gauge, 10-cm, Yueh catheter was introduced. An ultrasound image was saved for documentation purposes. The paracentesis was performed. The catheter was removed and a dressing was applied. The patient tolerated the procedure well without immediate post procedural complication. FINDINGS: A total of approximately 4.2 L of yellow fluid was removed. Ordering provider did not request laboratory samples. IMPRESSION: Successful ultrasound-guided paracentesis yielding 4.2 liters of peritoneal fluid. Read by: Reatha Armour, PA-C Electronically Signed   By: Ruthann Cancer M.D.   On: 05/27/2022 11:54    Microbiology: Results for orders placed or performed during the  hospital encounter of 02/04/22  Culture, blood (Routine X 2) w Reflex to ID Panel     Status: None   Collection Time: 02/08/22  8:04 PM   Specimen: BLOOD  Result Value Ref Range Status   Specimen Description BLOOD BLOOD RIGHT HAND  Final   Special Requests    Final    BOTTLES DRAWN AEROBIC AND ANAEROBIC Blood Culture adequate volume   Culture   Final    NO GROWTH 6 DAYS Performed at [facility], [address] [place], [place] [address]    Report Status 02/14/2022 FINAL  Final  Culture, blood (Routine X 2) w Reflex to ID Panel     Status: None   Collection Time: 02/08/22  8:20 PM   Specimen: BLOOD  Result Value Ref Range Status   Specimen Description BLOOD BLOOD LEFT HAND  Final   Special Requests   Final    BOTTLES DRAWN AEROBIC AND ANAEROBIC Blood Culture adequate volume   Culture   Final    NO GROWTH 6 DAYS Performed at [facility], [address] [place],  [address]    Report Status 02/14/2022 FINAL  Final    Labs: CBC: Recent Labs  Lab 06/17/22 1259 06/18/22 0030 06/18/22 1210 06/19/22 0001 06/19/22 1200 06/20/22 0626 06/20/22 1355  WBC 5.2  --   --   --   --  3.5*  --   NEUTROABS  --   --   --   --   --  2.1  --   HGB 5.2*   < > 7.4* 7.1* 7.4* 6.7* 7.8*  HCT 17.3*   < > 22.5* 21.7* 21.4* 20.2* 23.6*  MCV 80.8  --   --   --   --  81.5  --   PLT 146*  --   --   --   --  111*  --    < > = values in this interval not displayed.   Basic Metabolic Panel: Recent Labs  Lab 06/17/22 1559 06/18/22 0633 06/20/22 0626  NA 137 138 142  K 4.6 4.8 3.3*  CL 112* 113* 115*  CO2 20* 22 22  GLUCOSE 196* 134* 161*  BUN 39* 38* 33*  CREATININE 1.73* 1.61* 1.56*  CALCIUM 7.8* 8.0* 8.1*   Liver Function Tests: Recent Labs  Lab 06/17/22 1559 06/20/22 0626  AST 32 44*  ALT 18 22  ALKPHOS 96 84  BILITOT 1.9* 1.6*  PROT 4.2* 3.8*  ALBUMIN 2.5* 2.1*   CBG: No results for input(s): "GLUCAP" in the last 168 hours.  Discharge time spent: greater than 30 minutes.  Signed: Fritzi Mandes, MD [facility] 06/20/2022

## 2022-06-20 NOTE — TOC CM/SW Note (Signed)
  Transition of Care (TOC) Screening Note   Patient Details  Name: Rachel Huber Date of Birth: [birth year]   Transition of Care [facility]) CM/SW Contact:    Colen Darling, [place] Phone Number: 06/20/2022, 3:13 PM    Transition of Care Department [facility]) has reviewed patient and no TOC needs have been identified at this time. We will continue to monitor patient advancement through interdisciplinary progression rounds. If new patient transition needs arise, please place a TOC consult.

## 2022-06-21 LAB — TYPE AND SCREEN
ABO/RH(D): A POS
Antibody Screen: NEGATIVE
Unit division: 0
Unit division: 0
Unit division: 0
Unit division: 0
Unit division: 0

## 2022-06-21 LAB — BPAM RBC
Blood Product Expiration Date: 202311242359
Blood Product Expiration Date: 202311242359
Blood Product Expiration Date: 202311262359
Blood Product Expiration Date: 202311262359
Blood Product Expiration Date: 202311292359
ISSUE DATE / TIME: 202310311740
ISSUE DATE / TIME: 202310312124
ISSUE DATE / TIME: 202311010309
ISSUE DATE / TIME: 202311010729
ISSUE DATE / TIME: 202311030916
Unit Type and Rh: 6200
Unit Type and Rh: 6200
Unit Type and Rh: 6200
Unit Type and Rh: 6200
Unit Type and Rh: 6200

## 2022-06-21 LAB — PREPARE RBC (CROSSMATCH)

## 2022-06-23 ENCOUNTER — Encounter: Payer: Self-pay | Admitting: Interventional Radiology

## 2022-06-23 ENCOUNTER — Encounter: Payer: Self-pay | Admitting: Gastroenterology

## 2022-06-23 ENCOUNTER — Ambulatory Visit
Admission: RE | Admit: 2022-06-23 | Discharge: 2022-06-23 | Disposition: A | Discharge status: Home / Self Care | Payer: 59 | Source: Ambulatory Visit | Attending: Gastroenterology | Admitting: Gastroenterology

## 2022-06-23 DIAGNOSIS — K7581 Nonalcoholic steatohepatitis (NASH): Secondary | ICD-10-CM | POA: Insufficient documentation

## 2022-06-23 DIAGNOSIS — R188 Other ascites: Secondary | ICD-10-CM | POA: Diagnosis not present

## 2022-06-23 DIAGNOSIS — K7469 Other cirrhosis of liver: Secondary | ICD-10-CM | POA: Diagnosis not present

## 2022-06-23 DIAGNOSIS — K7460 Unspecified cirrhosis of liver: Secondary | ICD-10-CM | POA: Diagnosis present

## 2022-06-23 MED ORDER — ALBUMIN HUMAN 25 % IV SOLN
INTRAVENOUS | Status: AC
  Filled 2022-06-23: qty 100

## 2022-06-23 MED ORDER — ALBUMIN HUMAN 25 % IV SOLN: 25.0000 g | Freq: Once | INTRAVENOUS | Status: AC

## 2022-06-23 MED ORDER — ALBUMIN HUMAN 25 % IV SOLN
INTRAVENOUS | Status: AC
  Administered 2022-06-23: 25 g via INTRAVENOUS
  Filled 2022-06-23: qty 100

## 2022-06-23 MED ORDER — LIDOCAINE-PRILOCAINE 2.5-2.5 % EX CREA: 1.0000 | TOPICAL_CREAM | CUTANEOUS | 0 refills | Status: DC

## 2022-06-23 MED ORDER — HEPARIN SOD (PORK) LOCK FLUSH 100 UNIT/ML IV SOLN: 500.0000 [IU] | Freq: Once | INTRAVENOUS | Status: AC

## 2022-06-23 MED ORDER — LIDOCAINE HCL (PF) 1 % IJ SOLN
20.0000 mL | Freq: Once | INTRAMUSCULAR | Status: AC
  Administered 2022-06-23: 20 mL via INTRADERMAL

## 2022-06-23 MED ORDER — HEPARIN SOD (PORK) LOCK FLUSH 100 UNIT/ML IV SOLN
INTRAVENOUS | Status: AC
  Administered 2022-06-23: 500 [IU] via INTRAVENOUS
  Filled 2022-06-23: qty 5

## 2022-06-23 NOTE — Progress Notes (Signed)
RN called and stated that pt had deep port and was experiencing lots of pain with access. RN requests Emla cream for future port access for frequent albumin infusions. Rx sent electronically with instructtions to [facility] at pt request.     Narda Rutherford, AGNP-BC 06/23/2022, 1:13 PM

## 2022-06-23 NOTE — Procedures (Signed)
PROCEDURE SUMMARY:  Successful US guided paracentesis from RLQ.  Yielded 7.5L of ascitic fluid.  No immediate complications.  Pt tolerated well.   Specimen not sent for labs.  EBL < 13m  Mariateresa Batra PA-C 06/23/2022 1:20 PM

## 2022-06-24 DIAGNOSIS — Z23 Encounter for immunization: Secondary | ICD-10-CM | POA: Diagnosis not present

## 2022-06-24 DIAGNOSIS — K7469 Other cirrhosis of liver: Secondary | ICD-10-CM | POA: Diagnosis not present

## 2022-06-24 DIAGNOSIS — R188 Other ascites: Secondary | ICD-10-CM | POA: Diagnosis not present

## 2022-06-24 DIAGNOSIS — K766 Portal hypertension: Secondary | ICD-10-CM | POA: Diagnosis not present

## 2022-06-24 DIAGNOSIS — J111 Influenza due to unidentified influenza virus with other respiratory manifestations: Secondary | ICD-10-CM | POA: Diagnosis not present

## 2022-06-24 DIAGNOSIS — D696 Thrombocytopenia, unspecified: Secondary | ICD-10-CM | POA: Diagnosis not present

## 2022-06-25 ENCOUNTER — Ambulatory Visit (HOSPITAL_COMMUNITY): Payer: 59 | Attending: Internal Medicine

## 2022-06-25 ENCOUNTER — Encounter: Payer: Self-pay | Admitting: Physician Assistant

## 2022-06-25 ENCOUNTER — Inpatient Hospital Stay: Payer: 59

## 2022-06-25 DIAGNOSIS — R262 Difficulty in walking, not elsewhere classified: Secondary | ICD-10-CM | POA: Insufficient documentation

## 2022-06-25 DIAGNOSIS — D563 Thalassemia minor: Secondary | ICD-10-CM | POA: Insufficient documentation

## 2022-06-25 DIAGNOSIS — R2689 Other abnormalities of gait and mobility: Secondary | ICD-10-CM | POA: Insufficient documentation

## 2022-06-25 DIAGNOSIS — K7460 Unspecified cirrhosis of liver: Secondary | ICD-10-CM | POA: Diagnosis present

## 2022-06-25 DIAGNOSIS — D500 Iron deficiency anemia secondary to blood loss (chronic): Secondary | ICD-10-CM | POA: Insufficient documentation

## 2022-06-25 DIAGNOSIS — M6281 Muscle weakness (generalized): Secondary | ICD-10-CM | POA: Insufficient documentation

## 2022-06-25 LAB — CBC
HCT: 26.6 % — ABNORMAL LOW (ref 36.0–46.0)
Hemoglobin: 7.9 g/dL — ABNORMAL LOW (ref 12.0–15.0)
MCH: 26.9 pg (ref 26.0–34.0)
MCHC: 29.7 g/dL — ABNORMAL LOW (ref 30.0–36.0)
MCV: 90.5 fL (ref 80.0–100.0)
Platelets: 21 10*3/uL — CL (ref 150–400)
RBC: 2.94 MIL/uL — ABNORMAL LOW (ref 3.87–5.11)
RDW: 19.6 % — ABNORMAL HIGH (ref 11.5–15.5)
WBC: 2.5 10*3/uL — ABNORMAL LOW (ref 4.0–10.5)
nRBC: 0 % (ref 0.0–0.2)

## 2022-06-25 LAB — SAMPLE TO BLOOD BANK

## 2022-06-25 IMAGING — US US PARACENTESIS
1 series · 6 of 6 positions shown · non-contrast
Comparison: none

INDICATION: Ascites request received for paracentesis.

[Series 1: us paracentesis · 0.23mm/px · 6 of 6 slices shown]
[im 1/6]
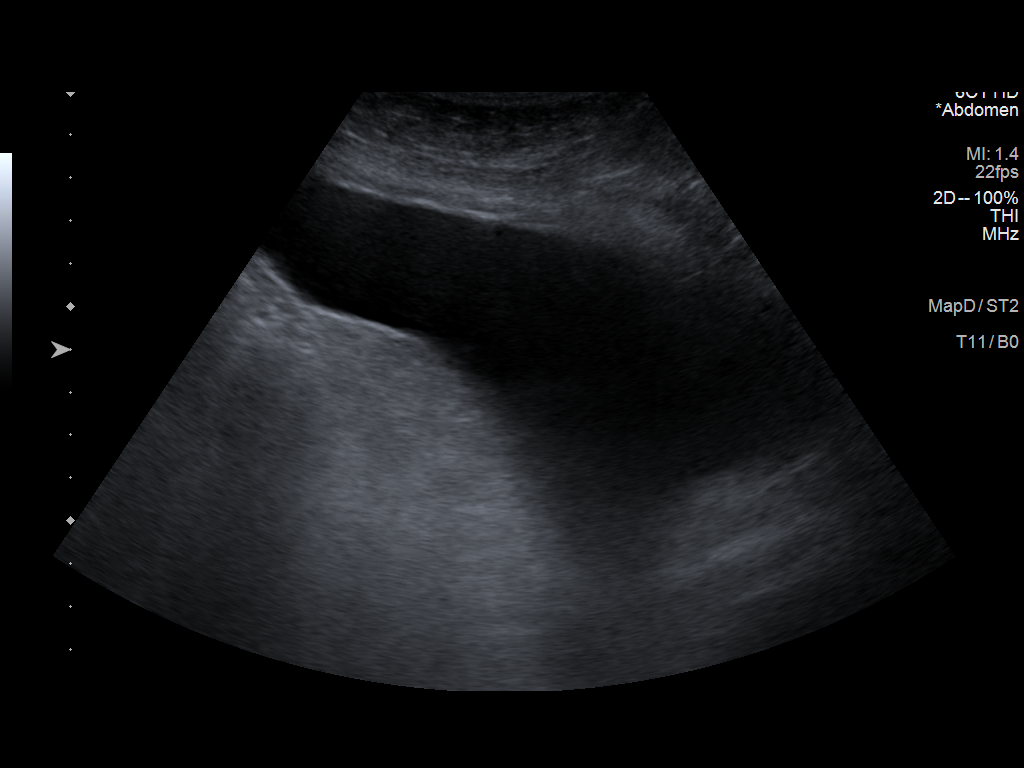
[im 2/6]
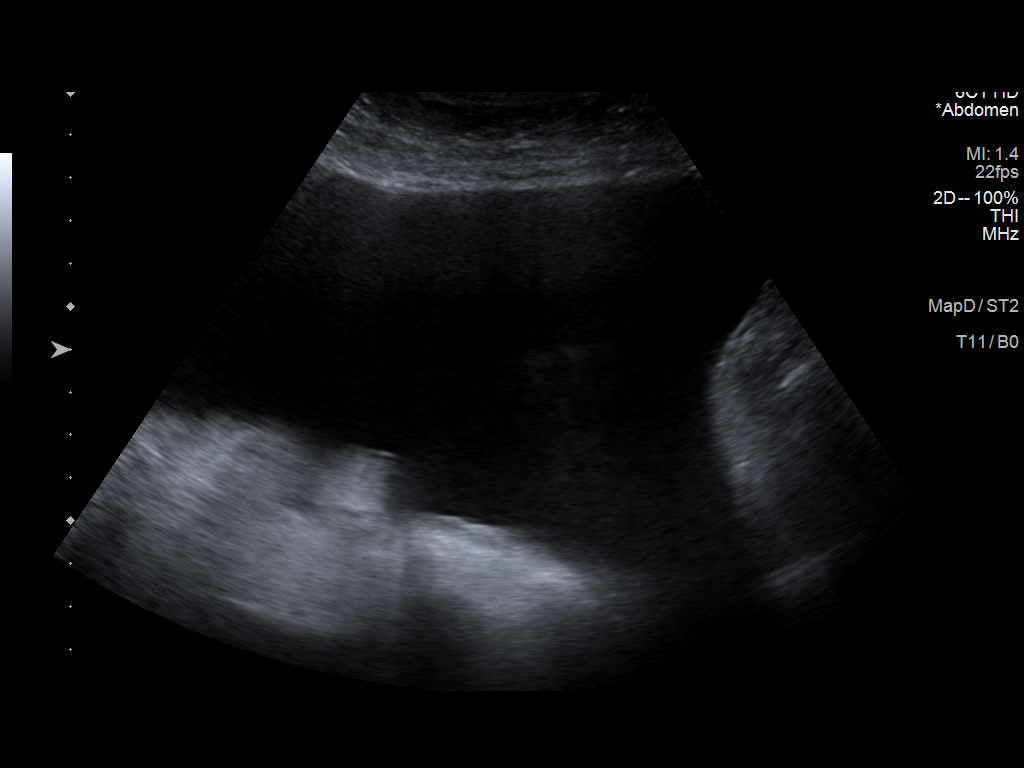
[im 3/6]
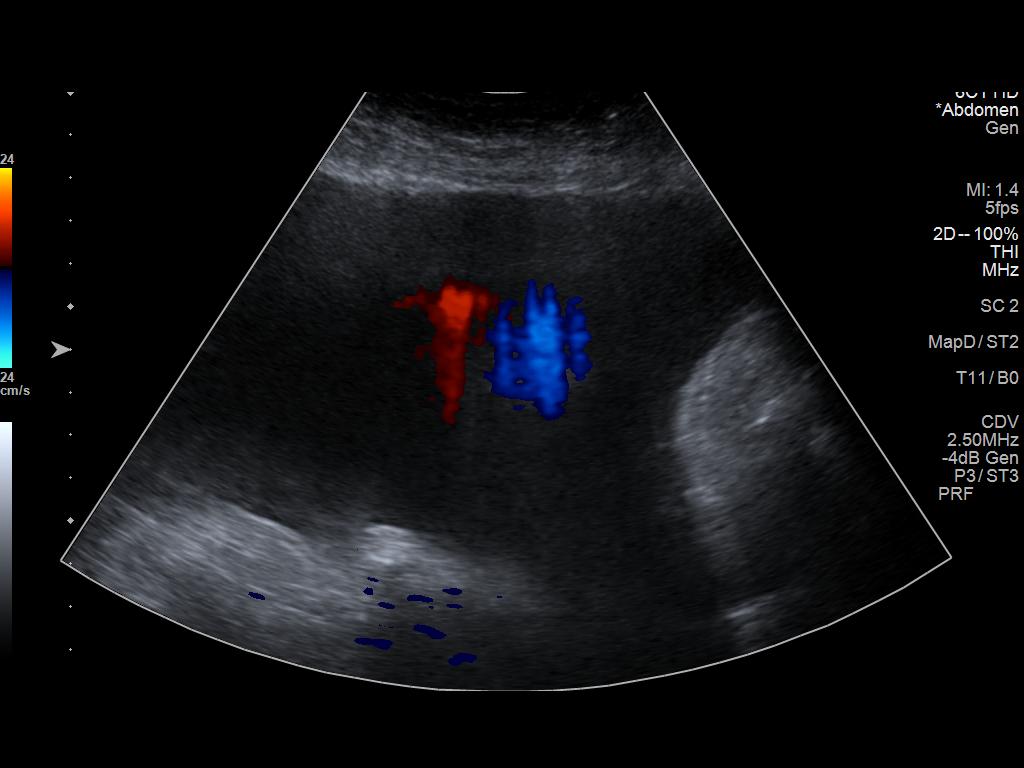
[im 4/6]
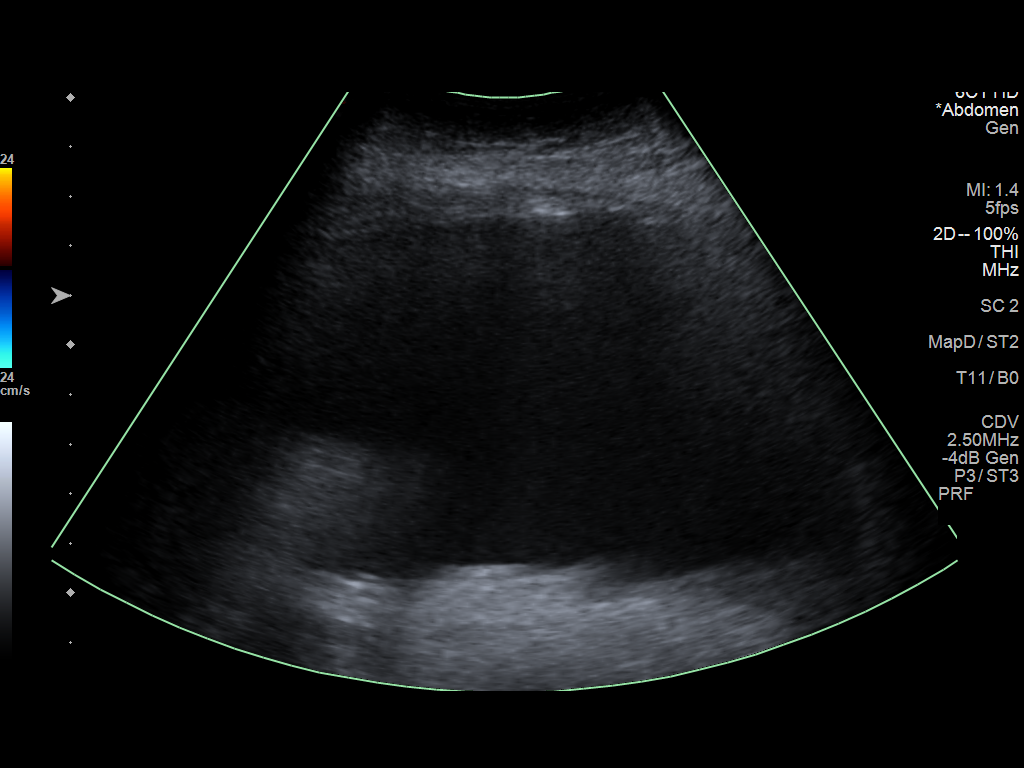
[im 5/6]
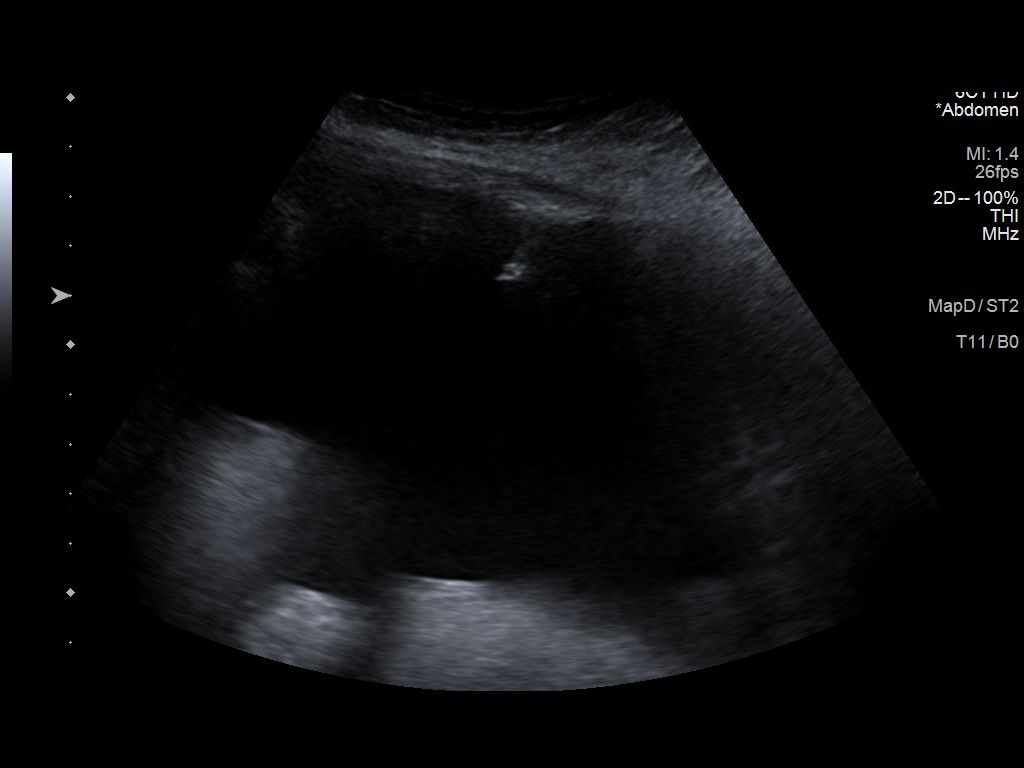
[im 6/6]
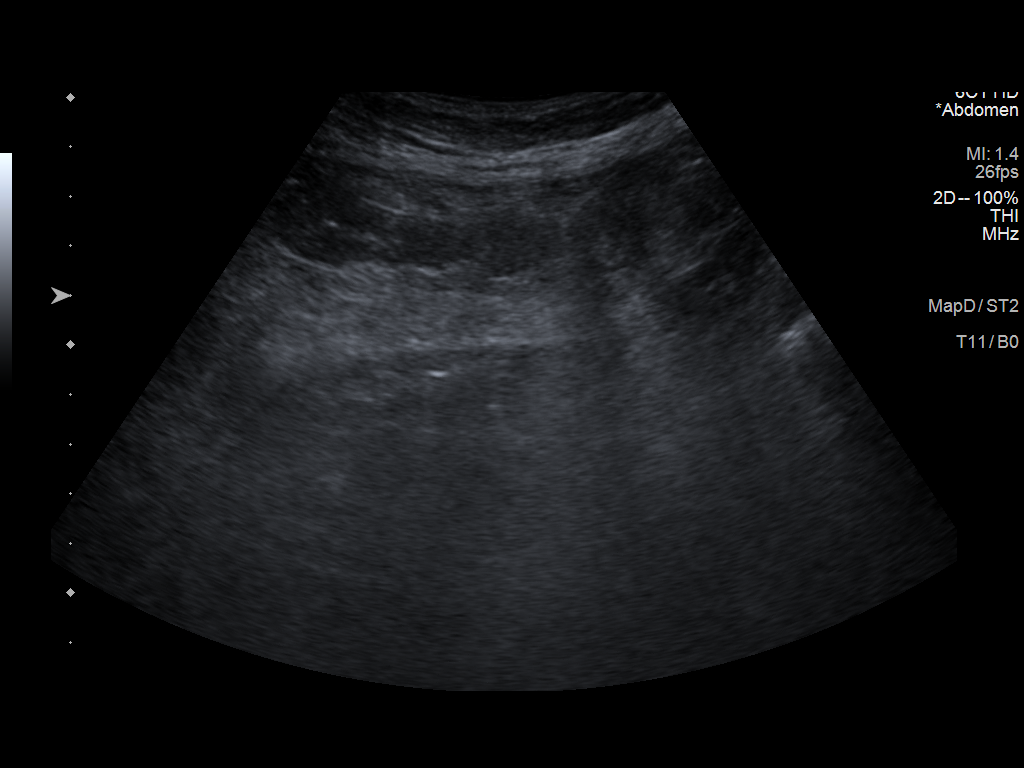

[6 of 6 positions shown; findings below may reference images not displayed]

EXAM:
ULTRASOUND GUIDED PARACENTESIS

MEDICATIONS:
Local 1% lidocaine only.

COMPLICATIONS:
None immediate.

PROCEDURE:
Informed written consent was obtained from the patient after a
discussion of the risks, benefits and alternatives to treatment. A
timeout was performed prior to the initiation of the procedure.

Initial ultrasound scanning demonstrates a large amount of ascites
within the right lower abdominal quadrant. The right lower abdomen
was prepped and draped in the usual sterile fashion. 1% lidocaine
was used for local anesthesia.

Following this, a 19 gauge, 10-cm, Yueh catheter was introduced. An
ultrasound image was saved for documentation purposes. The
paracentesis was performed. The catheter was removed and a dressing
was applied. The patient tolerated the procedure well without
immediate post procedural complication.
Patient received post-procedure intravenous albumin; see nursing
notes for details.
FINDINGS: A total of approximately 13.6 L of clear yellow fluid was removed.
Samples were sent to the laboratory as requested by the clinical
team.
IMPRESSION: Successful ultrasound-guided paracentesis yielding 13.6 liters of
peritoneal fluid.

This exam was performed by Nnawuihe Decency, and was supervised
and interpreted by Dr. Tirza.

## 2022-06-25 NOTE — Progress Notes (Deleted)
CRITICAL VALUE ALERT Critical value received:  platelets 21 Date of notification:  06-25-22 Time of notification: 1605 Critical value read back:  Yes.   Nurse who received alert:  C. Edmon Magid RN MD notified time and response:  1615, Dr. Delton Coombes , will repeat labs tomorrow and re-evaluate at that time.

## 2022-06-25 NOTE — Therapy (Signed)
OUTPATIENT PHYSICAL THERAPY TREATMENT      Patient Name: NOLIE BIGNELL MRN: [id number] DOB:11/27/1955, 66 y.o., female Today's Date: 06/25/2022   PT End of Session - 06/25/22 1400     Visit Number 11    Number of Visits 18    Date for PT Re-Evaluation 07/14/22    Authorization Type [employer]H (no VL)    Progress Note Due on Visit 18    Activity Tolerance Patient tolerated treatment well;Patient limited by pain;Patient limited by fatigue    Behavior During Therapy [facility] for tasks assessed/performed                   Past Medical History:  Diagnosis Date   Anxiety    Arthritis    Cirrhosis of liver ([place])    Diabetes mellitus without complication (HCC)    Dyspnea    DOE   GERD (gastroesophageal reflux disease)    Grade IV internal hemorrhoids    Contingency plan for any future admissions for severe anemia in the setting of persistent GI bleed:  nuclear medicine tagged RBC scan to assess for location of GI bleeding - if from rectum, would then proceed to angiogram with possible repeat embolization.  Please notify IR in this case.   Hepatitis    PAST   Hypertension    Neuropathy    Neuropathy, diabetic ([place])    Pneumonia    Sinus complaint    Sleep apnea    CPAP   Thalassemia minor 1992   Past Surgical History:  Procedure Laterality Date   ABDOMINAL HYSTERECTOMY     BREAST BIOPSY Right 02/15/2018   [place] bx 6-6:30 ribbon shape, ONE CORE FRAGMENT WITH FIBROSIS. ONE CORE FRAGMENT WITH PORTION OF A DILATED   BREAST BIOPSY Right 02/15/2018   [place] bx 9:00 heart shape, USUAL DUCTAL HYPERPLASIA   BREAST LUMPECTOMY Right 03/09/2018   Procedure: BREAST LUMPECTOMY x 2;  Surgeon: Benjamine Sprague, DO;  Location: [facility] ORS;  Service: General;  Laterality: Right;   CATARACT EXTRACTION W/PHACO Left 06/11/2016   Procedure: CATARACT EXTRACTION PHACO AND INTRAOCULAR LENS PLACEMENT (IOC);  Surgeon: Estill Cotta, MD;  Location: [facility] ORS;  Service: Ophthalmology;  Laterality:  Left;  Lot # [id number] H US:01:38.6 AP%:26.4 CDE:44.15   CATARACT EXTRACTION W/PHACO Right 06/15/2018   Procedure: CATARACT EXTRACTION PHACO AND INTRAOCULAR LENS PLACEMENT (IOC);  Surgeon: Birder Robson, MD;  Location: [facility] ORS;  Service: Ophthalmology;  Laterality: Right;  [place] 00:38.2 CDE 4.23 Fluid Pack Lot # [id number]   COLONOSCOPIES     COLONOSCOPY WITH PROPOFOL N/A 10/10/2020   Procedure: COLONOSCOPY WITH PROPOFOL;  Surgeon: Lesly Rubenstein, MD;  Location: [facility] ENDOSCOPY;  Service: Endoscopy;  Laterality: N/A;   COLONOSCOPY WITH PROPOFOL N/A 11/20/2020   Procedure: COLONOSCOPY WITH PROPOFOL;  Surgeon: Lesly Rubenstein, MD;  Location: [facility] ENDOSCOPY;  Service: Endoscopy;  Laterality: N/A;  DM STAT CBC, BMP COVID POSITIVE 09/02/2020   COLONOSCOPY WITH PROPOFOL N/A 06/19/2022   Procedure: COLONOSCOPY WITH PROPOFOL;  Surgeon: Lesly Rubenstein, MD;  Location: [facility] ENDOSCOPY;  Service: Endoscopy;  Laterality: N/A;   CTR     ESOPHAGOGASTRODUODENOSCOPY (EGD) WITH PROPOFOL N/A 10/10/2020   Procedure: ESOPHAGOGASTRODUODENOSCOPY (EGD) WITH PROPOFOL;  Surgeon: Lesly Rubenstein, MD;  Location: [facility] ENDOSCOPY;  Service: Endoscopy;  Laterality: N/A;  COVID POSITIVE 10/08/2020   FLEXIBLE SIGMOIDOSCOPY N/A 02/06/2022   Procedure: FLEXIBLE SIGMOIDOSCOPY;  Surgeon: Lesly Rubenstein, MD;  Location: [facility] ENDOSCOPY;  Service: Endoscopy;  Laterality: N/A;  Patient requests  anesthesia   IR ANGIOGRAM SELECTIVE EACH ADDITIONAL VESSEL  02/07/2022   IR ANGIOGRAM SELECTIVE EACH ADDITIONAL VESSEL  02/07/2022   IR ANGIOGRAM VISCERAL SELECTIVE  02/07/2022   IR EMBO ARTERIAL NOT HEMORR HEMANG INC GUIDE ROADMAPPING  02/07/2022   IR IMAGING GUIDED PORT INSERTION  06/13/2022   IR PARACENTESIS  12/17/2021   IR PARACENTESIS  03/25/2022   IR RADIOLOGIST EVAL & MGMT  03/18/2022   IR RADIOLOGIST EVAL & MGMT  05/07/2022   IR RADIOLOGIST EVAL & MGMT  05/16/2022   IR US GUIDE VASC ACCESS RIGHT  02/07/2022   JOINT REPLACEMENT      KNEE SURGERY Right 09/24/2017   plates and pins   TOTAL SHOULDER ARTHROPLASTY Right 09/27/2015   Patient Active Problem List   Diagnosis Date Noted   GI bleed 06/18/2022   Rectal bleeding 06/17/2022   Obesity (BMI 30-39.9) 06/17/2022   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 mellitus ([place]) 06/17/2022   Grade IV internal hemorrhoids 05/16/2022   ABLA (acute blood loss anemia) 03/20/2022   Acute on chronic blood loss anemia 03/05/2022   Thrombocytopenia ([place]) 03/05/2022   Fever 02/08/2022   Pancytopenia ([place]) 02/07/2022   GI bleeding 02/04/2022   Chronic kidney disease, stage 3b ([place]) 02/04/2022   Depression with anxiety 02/04/2022   Liver cirrhosis secondary to NASH ([place])    Acute GI hemorrhage 01/28/2022   AKI (acute kidney injury) ([place]) 01/27/2022   Insomnia 01/27/2022   Iron deficiency anemia due to chronic blood loss 02/13/2021   History of uterine cancer 02/13/2021   Decompensated hepatic cirrhosis ([place]) 10/25/2020   OSA on CPAP 10/25/2020   Glossitis 10/25/2020   Hyponatremia 10/08/2020   Acute respiratory failure with hypoxia ([place]) 09/10/2020   Pneumonia due to COVID-19 virus 09/08/2020   Hip fracture, right ([place]) 09/04/2020   Closed right hip fracture ([place]) 09/02/2020   Type 2 diabetes mellitus with hyperlipidemia ([place]) 09/02/2020   Thalassemia minor    HLD (hyperlipidemia)    Acute kidney injury superimposed on CKD ([place])    Depression    Fall at home, initial encounter    Nondisplaced fracture of greater trochanter of right femur, initial encounter for closed fracture ([place])     PCP: Frazier Richards MD  REFERRING PROVIDER: Tinnie Gens, NP   REFERRING DIAG: PT eval/tx for strength and conditioning   THERAPY DIAG:  Muscle weakness (generalized)  Difficulty in walking, not elsewhere classified  Other abnormalities of gait and mobility  Cirrhosis, non-alcoholic ([place])  Rationale for Evaluation and Treatment Rehabilitation  ONSET DATE:  Chronic  SUBJECTIVE:   SUBJECTIVE STATEMENT:  Patient with hospitalization last week for blood transfusion.  Also had a colonoscopy.  She was in the hospital a total of 4 days. Reports some general weakness   PERTINENT HISTORY: Paracentesis each week (Tuesdays) with 8L of removal Falls, weakness,DM, R shoulder reverse replacement an Feb 2019 R knee pain ([place])   PAIN:  Are you having pain? Yes: NPRS scale: 5/10 Pain location: knees BLE; feel tight Pain description: aching  Aggravating factors: use, movement, WB Relieving factors: rest, meds, non WB  PRECAUTIONS: Fall  WEIGHT BEARING RESTRICTIONS No  FALLS:  Has patient fallen in last 6 months? Yes. Number of falls 1  LIVING ENVIRONMENT: Lives with: lives with their family and lives with their spouse Lives in: House/apartment Stairs: No Has following equipment at home: Single point cane and Environmental consultant - 2 wheeled  OCCUPATION: Retired/ Disability   PLOF: Needs assistance with  ADLs  PATIENT GOALS  Get to point of not having to use cane/ improve balance    OBJECTIVE:   DIAGNOSTIC FINDINGS: NA   COGNITION:  Overall cognitive status: Within functional limits for tasks assessed     SENSATION: [place]   LOWER EXTREMITY MMT:  MMT Right eval Left eval Right 06/16/22 Left 06/16/22  Hip flexion 4 4 4- 4  Hip extension      Hip abduction      Hip adduction      Hip internal rotation      Hip external rotation      Knee flexion 4 4    Knee extension 4 4 4+ 4+  Ankle dorsiflexion 4 4 4+ 4+  Ankle plantarflexion      Ankle inversion      Ankle eversion       (Blank rows = not tested)  FUNCTIONAL TESTS:  5 times sit to stand: 28.5 second with use of hands  2 minute walk test: 170 feet  GAIT: Distance walked: 170 feet Assistive device utilized: Walker - 2 wheeled Level of assistance: Modified independence and SBA Comments: flexed trunk, decreased stride  TODAY'S TREATMENT: 06/25/22 Standing: Heel raises 2 x  10 Hip abduction 2 x 10 Hip extension 2  x 10 Marching 2 x 10  LAQ's 2 x 10 2# each  06/16/22 5 x sit to stand 56 sec using hands; a couple times needs more than one attempt to stand 5 x sit to stand 2nd attempt; 33 sec using hands to assist up but improved form Progress note 2 MWT unable today Seated  Heel/toe raises x 20 LAQ's 2" hold 2# x 20 each Marching 2#  x 20`    10.23.23 O2 sats monitored Seated:  LAQ 10X3" each (84-92%)  STS from standard chair with black foam no UE 10X (84% after completing 5/rest and complete 5X more after return to 92%)  Standing:  with UE assist   Alternating marching 2X10   Hip abduction 2X10   Minisquats 10X  06/06/22 Standing: Heel raises 2 x 10 Hip abduction 2 x 10 each Sidestepping table length x 3 down and back Mini squats x 10 Tandem stance 2 x 20" each  05/30/22  2MWT 142f with RW  STS 10x elevated height for no HHA needed  Heel raises  Toe raises  Marching  Squat  Educated on benefits of compression garments and use of butler to assist with donning. Measurements taken and given handout for ETI.  05/26/22 Nustep (seat 12) lv 2 4 min dynamic warmup  Step up 6 inch x 10 HHA x 2 Clinic ambulation 170 feet with RW  Sit to stand x10   05/23/22            Nustep hills 3 x 6' level 3            Step up 6" x 10 B            Marching x 15             Minisquat x 10             Side step at // with green theraband  x 3 RT            Sit to stand x 10 with green theraband      05/19/22  Standing:  Heel raises  20X                         Toeraises 20X Marching 10X 2 sets 1 HHA with intermittent 2HHA                        Mini squat 10X 2 sets                         Side step at // x 2 RT    Sit to stand x10 no UE's from standard chair, no UE    Ambulation around clinic with RW for activity tolerance X150 feet without rest                     05/16/22                        Standing:                         Heel raises x 10                        Marching x 10                         Mini squat x 10                         Side step at // x 2 RT                         Sit to stand x10   PATIENT EDUCATION:  Education details: on eval findings, POC and HEP  Person educated: Patient Education method: Explanation Education comprehension: verbalized understanding   HOME EXERCISE PROGRAM:            05/30/22:  Begin walking program with RW  04/19/22 - Heel Raises with Counter Support  - 2 x daily - 7 x weekly - 1 sets - 10 reps - 3-5" hold - Mini Squat with Counter Support  - 2 x daily - 7 x weekly - 1 sets - 10 reps - 3-5" hold - Sit to Stand with Counter Support  - 2 x daily - 7 x weekly - 1 sets - 10 reps9/              Access Code: [email or web address] URL: [email or web address] Date: 05/01/2022 Prepared by: Josue Hector  Exercises - Seated Heel Toe Raises  - 2 x daily - 7 x weekly - 1 sets - 10 reps - Seated Long Arc Quad  - 2 x daily - 7 x weekly - 1 sets - 10 reps - Seated March  - 2 x daily - 7 x weekly - 1 sets - 10 reps - Supine Active Straight Leg Raise  - 7 x weekly - 1 sets - 10 reps  ASSESSMENT:  CLINICAL IMPRESSION: Patient fatigued per hospitalization.  Patient O2 sat good throughout treatment however she fatigues quickly and gets short of breath quickly; needs frequent rest breaks today.  Shortened treatment time today due to fatigue.  Patient will benefit from continued skilled therapy services  to address deficits and promote return to optimal function.     OBJECTIVE IMPAIRMENTS Abnormal  gait, decreased activity tolerance, decreased balance, decreased mobility, difficulty walking, decreased ROM, decreased strength, improper body mechanics, and pain.   ACTIVITY LIMITATIONS lifting, bending, standing, squatting, stairs, transfers, and locomotion level  PARTICIPATION LIMITATIONS: meal prep, cleaning, laundry, shopping, community activity, and yard  work  PERSONAL FACTORS Past/current experiences and Time since onset of injury/illness/exacerbation are also affecting patient's functional outcome.   REHAB POTENTIAL: Good  CLINICAL DECISION MAKING: Stable/uncomplicated  EVALUATION COMPLEXITY: Low   GOALS: SHORT TERM GOALS: Target date: 05/29/2022  Patient will be independent with initial HEP and self-management strategies to improve functional outcomes Baseline:  Goal status: IN PROGRESS  2. Patient will be able to stand, with minimal support, at least 5 minutes for improved ability to perform meal preparation/ cooking/ grooming/ cleaning ADLs.  Baseline: 2.5 minutes Goal status: IN PROGRESS  LONG TERM GOALS: Target date: 07/14/2022  Patient will be independent with advanced HEP and self-management strategies to improve functional outcomes Baseline:  Goal status: IN PROGRESS  2.  Patient will report at least 65% overall improvement in subjective complaint to indicate improvement in ability to perform ADLs. Baseline:  Goal status: IN PROGRESS  3.  Patient will be able to stand, with minimal support, at least 10 minutes for improved ability to perform meal preparation/ cooking/ grooming/ cleaning ADLs.  Baseline: 2.5 minutes Goal status: IN PROGRESS  4. Patient will have equal to or > 4+/5 MMT throughout BLE to improve ability to perform functional mobility, stair ambulation and ADLs.  Baseline: See MMT Goal status: IN PROGRESS  5. Patient will be able to ambulate at least 300 feet during 2MWT with LRAD to demonstrate improved ability to perform functional mobility and associated tasks. Baseline: 170 feet Goal status: IN PROGRESS  PLAN: PT FREQUENCY: 2x/week  PT DURATION: 4 more weeks  PLANNED INTERVENTIONS: Therapeutic exercises, Therapeutic activity, Neuromuscular re-education, Balance training, Gait training, Patient/Family education, Joint manipulation, Joint mobilization, Stair training, Aquatic Therapy, Dry  Needling, Electrical stimulation, Spinal manipulation, Spinal mobilization, Cryotherapy, Moist heat, scar mobilization, Taping, Traction, Ultrasound, Biofeedback, Ionotophoresis 47m/ml Dexamethasone, and Manual therapy.  PLAN FOR NEXT SESSION: Progress functional LE strength, work on ambulation tolerance. balance  2:37 PM, 06/25/22 Favor Hackler Small Laporchia Nakajima MPT [facility] [place] #[phone]

## 2022-06-26 ENCOUNTER — Inpatient Hospital Stay: Payer: 59

## 2022-06-26 ENCOUNTER — Other Ambulatory Visit: Payer: Self-pay | Admitting: *Deleted

## 2022-06-26 DIAGNOSIS — D61818 Other pancytopenia: Secondary | ICD-10-CM

## 2022-06-26 DIAGNOSIS — M6281 Muscle weakness (generalized): Secondary | ICD-10-CM | POA: Diagnosis not present

## 2022-06-26 NOTE — Progress Notes (Signed)
Orders placed for CBCD per Dr. Delton Coombes

## 2022-06-26 NOTE — Progress Notes (Unsigned)
Late entry CRITICAL VALUE ALERT Critical value received:  platelets 21 Date of notification:  06-25-22 Time of notification: 1600 Critical value read back:  Yes.   Nurse who received alert:  C. Byran Bilotti RN MD notified time and response:  Dr. Tera Helper, 1630, will repeat CBC on 06-26-22. Patient notified.

## 2022-06-30 ENCOUNTER — Encounter (HOSPITAL_COMMUNITY): Payer: 59 | Admitting: Physical Therapy

## 2022-06-30 LAB — CBC WITH DIFFERENTIAL/PLATELET
Abs Immature Granulocytes: 0.01 10*3/uL (ref 0.00–0.07)
Basophils Absolute: 0 10*3/uL (ref 0.0–0.1)
Basophils Relative: 0 %
Eosinophils Absolute: 0.2 10*3/uL (ref 0.0–0.5)
Eosinophils Relative: 6 %
HCT: 28 % — ABNORMAL LOW (ref 36.0–46.0)
Hemoglobin: 8.7 g/dL — ABNORMAL LOW (ref 12.0–15.0)
Immature Granulocytes: 0 %
Lymphocytes Relative: 22 %
Lymphs Abs: 0.9 10*3/uL (ref 0.7–4.0)
MCH: 26.2 pg (ref 26.0–34.0)
MCHC: 31.1 g/dL (ref 30.0–36.0)
MCV: 84.3 fL (ref 80.0–100.0)
Monocytes Absolute: 0.5 10*3/uL (ref 0.1–1.0)
Monocytes Relative: 12 %
Neutro Abs: 2.5 10*3/uL (ref 1.7–7.7)
Neutrophils Relative %: 60 %
Platelets: 123 10*3/uL — ABNORMAL LOW (ref 150–400)
RBC: 3.32 MIL/uL — ABNORMAL LOW (ref 3.87–5.11)
RDW: 19.2 % — ABNORMAL HIGH (ref 11.5–15.5)
WBC: 4.1 10*3/uL (ref 4.0–10.5)
nRBC: 0 % (ref 0.0–0.2)

## 2022-07-01 ENCOUNTER — Ambulatory Visit
Admission: RE | Admit: 2022-07-01 | Discharge: 2022-07-01 | Disposition: A | Discharge status: Home / Self Care | Payer: 59 | Source: Ambulatory Visit | Attending: Gastroenterology | Admitting: Gastroenterology

## 2022-07-01 VITALS — BP 110/50 | HR 75 | Temp 97.8°F | Resp 12

## 2022-07-01 DIAGNOSIS — K922 Gastrointestinal hemorrhage, unspecified: Secondary | ICD-10-CM | POA: Insufficient documentation

## 2022-07-01 DIAGNOSIS — R188 Other ascites: Secondary | ICD-10-CM | POA: Diagnosis not present

## 2022-07-01 DIAGNOSIS — K7469 Other cirrhosis of liver: Secondary | ICD-10-CM | POA: Diagnosis not present

## 2022-07-01 DIAGNOSIS — K7581 Nonalcoholic steatohepatitis (NASH): Secondary | ICD-10-CM | POA: Insufficient documentation

## 2022-07-01 DIAGNOSIS — K7460 Unspecified cirrhosis of liver: Secondary | ICD-10-CM | POA: Insufficient documentation

## 2022-07-01 LAB — LIPID PANEL
Cholesterol: 73 mg/dL (ref 0–200)
HDL: 25 mg/dL — ABNORMAL LOW (ref >40)
LDL Cholesterol: 34 mg/dL (ref 0–99)
Total CHOL/HDL Ratio: 2.9 RATIO
Triglycerides: 71 mg/dL (ref <150)
VLDL: 14 mg/dL (ref 0–40)

## 2022-07-01 LAB — CBC WITH DIFFERENTIAL/PLATELET
Abs Immature Granulocytes: 0.01 10*3/uL (ref 0.00–0.07)
Basophils Absolute: 0 10*3/uL (ref 0.0–0.1)
Basophils Relative: 1 %
Eosinophils Absolute: 0.1 10*3/uL (ref 0.0–0.5)
Eosinophils Relative: 5 %
HCT: 18.9 % — ABNORMAL LOW (ref 36.0–46.0)
Hemoglobin: 6.1 g/dL — ABNORMAL LOW (ref 12.0–15.0)
Immature Granulocytes: 0 %
Lymphocytes Relative: 33 %
Lymphs Abs: 0.8 10*3/uL (ref 0.7–4.0)
MCH: 25.5 pg — ABNORMAL LOW (ref 26.0–34.0)
MCHC: 32.3 g/dL (ref 30.0–36.0)
MCV: 79.1 fL — ABNORMAL LOW (ref 80.0–100.0)
Monocytes Absolute: 0.3 10*3/uL (ref 0.1–1.0)
Monocytes Relative: 11 %
Neutro Abs: 1.2 10*3/uL — ABNORMAL LOW (ref 1.7–7.7)
Neutrophils Relative %: 50 %
Platelets: 108 10*3/uL — ABNORMAL LOW (ref 150–400)
RBC: 2.39 MIL/uL — ABNORMAL LOW (ref 3.87–5.11)
RDW: 19.7 % — ABNORMAL HIGH (ref 11.5–15.5)
WBC: 2.5 10*3/uL — ABNORMAL LOW (ref 4.0–10.5)
nRBC: 0 % (ref 0.0–0.2)

## 2022-07-01 LAB — HEPATIC FUNCTION PANEL
ALT: 23 U/L (ref 0–44)
AST: 40 U/L (ref 15–41)
Albumin: 2.7 g/dL — ABNORMAL LOW (ref 3.5–5.0)
Alkaline Phosphatase: 79 U/L (ref 38–126)
Bilirubin, Direct: 0.6 mg/dL — ABNORMAL HIGH (ref 0.0–0.2)
Indirect Bilirubin: 1.5 mg/dL — ABNORMAL HIGH (ref 0.3–0.9)
Total Bilirubin: 2.1 mg/dL — ABNORMAL HIGH (ref 0.3–1.2)
Total Protein: 4.7 g/dL — ABNORMAL LOW (ref 6.5–8.1)

## 2022-07-01 LAB — BASIC METABOLIC PANEL
Anion gap: 5 (ref 5–15)
BUN: 36 mg/dL — ABNORMAL HIGH (ref 8–23)
CO2: 21 mmol/L — ABNORMAL LOW (ref 22–32)
Calcium: 8.5 mg/dL — ABNORMAL LOW (ref 8.9–10.3)
Chloride: 113 mmol/L — ABNORMAL HIGH (ref 98–111)
Creatinine, Ser: 1.5 mg/dL — ABNORMAL HIGH (ref 0.44–1.00)
GFR, Estimated: 39 mL/min — ABNORMAL LOW (ref >60)
Glucose, Bld: 107 mg/dL — ABNORMAL HIGH (ref 70–99)
Potassium: 4.5 mmol/L (ref 3.5–5.1)
Sodium: 139 mmol/L (ref 135–145)

## 2022-07-01 MED ORDER — LIDOCAINE HCL (PF) 1 % IJ SOLN
10.0000 mL | Freq: Once | INTRAMUSCULAR | Status: AC
  Administered 2022-07-01: 10 mL via INTRADERMAL
  Filled 2022-07-01: qty 10

## 2022-07-01 MED ORDER — ALBUMIN HUMAN 25 % IV SOLN
INTRAVENOUS | Status: AC
  Administered 2022-07-01: 25 g via INTRAVENOUS
  Filled 2022-07-01: qty 200

## 2022-07-01 MED ORDER — ALBUMIN HUMAN 25 % IV SOLN: 25.0000 g | Freq: Once | INTRAVENOUS | Status: AC

## 2022-07-01 MED ORDER — ALBUMIN HUMAN 25 % IV SOLN
25.0000 g | Freq: Once | INTRAVENOUS | Status: AC
  Administered 2022-07-01: 25 g via INTRAVENOUS

## 2022-07-01 MED ORDER — HEPARIN SOD (PORK) LOCK FLUSH 100 UNIT/ML IV SOLN
INTRAVENOUS | Status: AC
  Administered 2022-07-01: 500 [IU] via INTRAVENOUS
  Filled 2022-07-01: qty 5

## 2022-07-01 MED ORDER — HEPARIN SOD (PORK) LOCK FLUSH 100 UNIT/ML IV SOLN: 500.0000 [IU] | Freq: Once | INTRAVENOUS | Status: AC

## 2022-07-01 NOTE — Procedures (Signed)
PROCEDURE SUMMARY:  Successful US guided therapeutic paracentesis from RLQ.  Yielded 8 L of clear, yellow fluid.  No immediate complications.  Pt tolerated well.   Specimen not sent for labs.  EBL < 1 mL  Tyson Alias, AGNP 07/01/2022 12:42 PM

## 2022-07-02 ENCOUNTER — Inpatient Hospital Stay: Payer: 59

## 2022-07-02 DIAGNOSIS — M6281 Muscle weakness (generalized): Secondary | ICD-10-CM | POA: Diagnosis not present

## 2022-07-02 DIAGNOSIS — Z95828 Presence of other vascular implants and grafts: Secondary | ICD-10-CM

## 2022-07-02 DIAGNOSIS — D500 Iron deficiency anemia secondary to blood loss (chronic): Secondary | ICD-10-CM

## 2022-07-02 LAB — SAMPLE TO BLOOD BANK

## 2022-07-02 LAB — CBC
HCT: 21.2 % — ABNORMAL LOW (ref 36.0–46.0)
Hemoglobin: 6.7 g/dL — CL (ref 12.0–15.0)
MCH: 25.8 pg — ABNORMAL LOW (ref 26.0–34.0)
MCHC: 31.6 g/dL (ref 30.0–36.0)
MCV: 81.5 fL (ref 80.0–100.0)
Platelets: 114 10*3/uL — ABNORMAL LOW (ref 150–400)
RBC: 2.6 MIL/uL — ABNORMAL LOW (ref 3.87–5.11)
RDW: 20 % — ABNORMAL HIGH (ref 11.5–15.5)
WBC: 2.5 10*3/uL — ABNORMAL LOW (ref 4.0–10.5)
nRBC: 0 % (ref 0.0–0.2)

## 2022-07-02 LAB — PREPARE RBC (CROSSMATCH)

## 2022-07-02 MED ORDER — DIPHENHYDRAMINE HCL 25 MG PO CAPS: 25.0000 mg | ORAL_CAPSULE | Freq: Once | ORAL | Status: DC

## 2022-07-02 MED ORDER — SODIUM CHLORIDE 0.9% IV SOLUTION
250.0000 mL | Freq: Once | INTRAVENOUS | Status: AC
  Administered 2022-07-02: 250 mL via INTRAVENOUS

## 2022-07-02 MED ORDER — SODIUM CHLORIDE 0.9% FLUSH
10.0000 mL | INTRAVENOUS | Status: AC | PRN
  Administered 2022-07-02: 10 mL

## 2022-07-02 MED ORDER — ACETAMINOPHEN 325 MG PO TABS
650.0000 mg | ORAL_TABLET | Freq: Once | ORAL | Status: AC
  Administered 2022-07-02: 650 mg via ORAL
  Filled 2022-07-02: qty 2

## 2022-07-02 MED ORDER — SODIUM CHLORIDE 0.9% FLUSH
10.0000 mL | Freq: Once | INTRAVENOUS | Status: AC
  Administered 2022-07-02: 10 mL via INTRAVENOUS

## 2022-07-02 MED ORDER — HEPARIN SOD (PORK) LOCK FLUSH 100 UNIT/ML IV SOLN
500.0000 [IU] | Freq: Every day | INTRAVENOUS | Status: AC | PRN
  Administered 2022-07-02: 500 [IU]

## 2022-07-02 NOTE — Progress Notes (Signed)
Patient presents today for possible blood transfusion per providers order.  Vital signs WNL.  Patient has no new complaints at this time.   Patient to come back tomorrow for 1UPRBC.  Unable to transfuse today due to antibodies in her blood.    Vital signs stable.  No complaints at this time.  Discharge from clinic via wheelchair in stable condition.  Alert and oriented X 3.  Follow up with [facility] as scheduled.

## 2022-07-03 ENCOUNTER — Inpatient Hospital Stay: Payer: 59

## 2022-07-03 ENCOUNTER — Ambulatory Visit: Payer: 59 | Admitting: General Surgery

## 2022-07-03 VITALS — BP 109/51 | HR 72 | Temp 97.6°F | Resp 18

## 2022-07-03 DIAGNOSIS — M6281 Muscle weakness (generalized): Secondary | ICD-10-CM | POA: Diagnosis not present

## 2022-07-03 DIAGNOSIS — D500 Iron deficiency anemia secondary to blood loss (chronic): Secondary | ICD-10-CM

## 2022-07-03 LAB — HEMOGLOBIN A1C
Hgb A1c MFr Bld: 5.5 % (ref 4.8–5.6)
Mean Plasma Glucose: 111.15 mg/dL

## 2022-07-03 MED ORDER — HEPARIN SOD (PORK) LOCK FLUSH 100 UNIT/ML IV SOLN
500.0000 [IU] | Freq: Every day | INTRAVENOUS | Status: AC | PRN
  Administered 2022-07-03: 500 [IU]

## 2022-07-03 MED ORDER — SODIUM CHLORIDE 0.9% FLUSH
10.0000 mL | INTRAVENOUS | Status: AC | PRN
  Administered 2022-07-03: 10 mL

## 2022-07-03 MED ORDER — SODIUM CHLORIDE 0.9% IV SOLUTION
250.0000 mL | Freq: Once | INTRAVENOUS | Status: AC
  Administered 2022-07-03: 250 mL via INTRAVENOUS

## 2022-07-03 NOTE — Progress Notes (Signed)
Patient presents today with 1 unit of PRBCs, patient reports tylenol and claritin at home.  Patient tolerated therapy with no complaints voiced. Side effects with management reviewed with understanding verbalized. Port site clean and dry with no bruising or swelling noted at site. Good blood return noted before and after administration of therapy. Band aid applied. Patient left in satisfactory condition with VSS and no s/s of distress noted.

## 2022-07-03 NOTE — Patient Instructions (Signed)
[facility]  Discharge Instructions: Thank you for choosing [facility] to provide your oncology and hematology care.  If you have a lab appointment with the [facility], please come in thru the Main Entrance and check in at the main information desk.  Wear comfortable clothing and clothing appropriate for easy access to any Portacath or PICC line.   We strive to give you quality time with your provider. You may need to reschedule your appointment if you arrive late (15 or more minutes).  Arriving late affects you and other patients whose appointments are after yours.  Also, if you miss three or more appointments without notifying the office, you may be dismissed from the clinic at the provider's discretion.      For prescription refill requests, have your pharmacy contact our office and allow 72 hours for refills to be completed.    Today you received the following 1 unit of PRBCs, return as scheduled.   To help prevent nausea and vomiting after your treatment, we encourage you to take your nausea medication as directed.  BELOW ARE SYMPTOMS THAT SHOULD BE REPORTED IMMEDIATELY: *FEVER GREATER THAN 100.4 F (38 C) OR HIGHER *CHILLS OR SWEATING *NAUSEA AND VOMITING THAT IS NOT CONTROLLED WITH YOUR NAUSEA MEDICATION *UNUSUAL SHORTNESS OF BREATH *UNUSUAL BRUISING OR BLEEDING *URINARY PROBLEMS (pain or burning when urinating, or frequent urination) *BOWEL PROBLEMS (unusual diarrhea, constipation, pain near the anus) TENDERNESS IN MOUTH AND THROAT WITH OR WITHOUT PRESENCE OF ULCERS (sore throat, sores in mouth, or a toothache) UNUSUAL RASH, SWELLING OR PAIN  UNUSUAL VAGINAL DISCHARGE OR ITCHING   Items with * indicate a potential emergency and should be followed up as soon as possible or go to the Emergency Department if any problems should occur.  Please show the CHEMOTHERAPY ALERT CARD or IMMUNOTHERAPY ALERT CARD at check-in to the Emergency Department  and triage nurse.  Should you have questions after your visit or need to cancel or reschedule your appointment, please contact [facility] [phone]  and follow the prompts.  Office hours are 8:00 a.m. to 4:30 p.m. Monday - Friday. Please note that voicemails left after 4:00 p.m. may not be returned until the following business day.  We are closed weekends and major holidays. You have access to a nurse at all times for urgent questions. Please call the main number to the clinic [phone] and follow the prompts.  For any non-urgent questions, you may also contact your provider using MyChart. We now offer e-Visits for anyone 72 and older to request care online for non-urgent symptoms. For details visit [email or web address].   Also download the MyChart app! Go to the app store, search "MyChart", open the app, select [facility], and log in with your MyChart username and password.  Masks are optional in the cancer centers. If you would like for your care team to wear a mask while they are taking care of you, please let them know. You may have one support person who is at least 64 years old accompany you for your appointments.

## 2022-07-04 ENCOUNTER — Encounter (HOSPITAL_COMMUNITY): Payer: 59 | Admitting: Physical Therapy

## 2022-07-04 LAB — TYPE AND SCREEN
ABO/RH(D): A POS
Antibody Screen: NEGATIVE
Unit division: 0

## 2022-07-04 LAB — BPAM RBC
Blood Product Expiration Date: 202312092359
ISSUE DATE / TIME: 202311160943
Unit Type and Rh: 6200

## 2022-07-07 ENCOUNTER — Inpatient Hospital Stay: Payer: 59

## 2022-07-07 DIAGNOSIS — M6281 Muscle weakness (generalized): Secondary | ICD-10-CM | POA: Diagnosis not present

## 2022-07-07 DIAGNOSIS — D500 Iron deficiency anemia secondary to blood loss (chronic): Secondary | ICD-10-CM

## 2022-07-07 DIAGNOSIS — I10 Essential (primary) hypertension: Secondary | ICD-10-CM | POA: Diagnosis not present

## 2022-07-07 DIAGNOSIS — Z6841 Body Mass Index (BMI) 40.0 and over, adult: Secondary | ICD-10-CM | POA: Diagnosis not present

## 2022-07-07 DIAGNOSIS — G4733 Obstructive sleep apnea (adult) (pediatric): Secondary | ICD-10-CM | POA: Diagnosis not present

## 2022-07-07 DIAGNOSIS — E1140 Type 2 diabetes mellitus with diabetic neuropathy, unspecified: Secondary | ICD-10-CM | POA: Diagnosis not present

## 2022-07-07 DIAGNOSIS — K7469 Other cirrhosis of liver: Secondary | ICD-10-CM | POA: Diagnosis not present

## 2022-07-07 LAB — CBC
HCT: 23.8 % — ABNORMAL LOW (ref 36.0–46.0)
Hemoglobin: 7.6 g/dL — ABNORMAL LOW (ref 12.0–15.0)
MCH: 25.7 pg — ABNORMAL LOW (ref 26.0–34.0)
MCHC: 31.9 g/dL (ref 30.0–36.0)
MCV: 80.4 fL (ref 80.0–100.0)
Platelets: 141 10*3/uL — ABNORMAL LOW (ref 150–400)
RBC: 2.96 MIL/uL — ABNORMAL LOW (ref 3.87–5.11)
RDW: 19.7 % — ABNORMAL HIGH (ref 11.5–15.5)
WBC: 3.3 10*3/uL — ABNORMAL LOW (ref 4.0–10.5)
nRBC: 0 % (ref 0.0–0.2)

## 2022-07-07 LAB — SAMPLE TO BLOOD BANK

## 2022-07-07 MED ORDER — SODIUM CHLORIDE 0.9% FLUSH
10.0000 mL | INTRAVENOUS | Status: AC
  Administered 2022-07-07: 10 mL

## 2022-07-07 NOTE — Progress Notes (Signed)
Per R. Pennington PA. Patient does not need to be transfused unless HGB is under 7.0.

## 2022-07-08 ENCOUNTER — Encounter (HOSPITAL_COMMUNITY): Payer: 59 | Admitting: Physical Therapy

## 2022-07-08 ENCOUNTER — Inpatient Hospital Stay: Payer: 59

## 2022-07-08 ENCOUNTER — Ambulatory Visit
Admission: RE | Admit: 2022-07-08 | Discharge: 2022-07-08 | Disposition: A | Discharge status: Home / Self Care | Payer: 59 | Source: Ambulatory Visit | Attending: Gastroenterology | Admitting: Gastroenterology

## 2022-07-08 DIAGNOSIS — K7460 Unspecified cirrhosis of liver: Secondary | ICD-10-CM | POA: Insufficient documentation

## 2022-07-08 DIAGNOSIS — K7469 Other cirrhosis of liver: Secondary | ICD-10-CM | POA: Diagnosis not present

## 2022-07-08 DIAGNOSIS — K7581 Nonalcoholic steatohepatitis (NASH): Secondary | ICD-10-CM | POA: Diagnosis present

## 2022-07-08 DIAGNOSIS — R188 Other ascites: Secondary | ICD-10-CM | POA: Diagnosis not present

## 2022-07-08 MED ORDER — ALBUMIN HUMAN 25 % IV SOLN
INTRAVENOUS | Status: AC
  Administered 2022-07-08: 25 g via INTRAVENOUS
  Filled 2022-07-08: qty 100

## 2022-07-08 MED ORDER — ALBUMIN HUMAN 25 % IV SOLN: 25.0000 g | Freq: Once | INTRAVENOUS | Status: AC

## 2022-07-08 MED ORDER — HEPARIN SOD (PORK) LOCK FLUSH 100 UNIT/ML IV SOLN: 500.0000 [IU] | Freq: Once | INTRAVENOUS | Status: AC

## 2022-07-08 MED ORDER — LIDOCAINE HCL (PF) 1 % IJ SOLN
10.0000 mL | Freq: Once | INTRAMUSCULAR | Status: AC
  Administered 2022-07-08: 10 mL via INTRADERMAL
  Filled 2022-07-08: qty 10

## 2022-07-08 MED ORDER — HEPARIN SOD (PORK) LOCK FLUSH 100 UNIT/ML IV SOLN
INTRAVENOUS | Status: AC
  Administered 2022-07-08: 500 [IU] via INTRAVENOUS
  Filled 2022-07-08: qty 5

## 2022-07-08 NOTE — Procedures (Signed)
PROCEDURE SUMMARY:  Successful image-guided paracentesis from the right lower abdomen.  Yielded 8.0 liters of yellow fluid.  No immediate complications.  EBL = trace. Patient tolerated well.   Specimen was not sent for labs.  Please see imaging section of Epic for full dictation.   Lura Em PA-C 07/08/2022 12:45 PM

## 2022-07-09 ENCOUNTER — Inpatient Hospital Stay: Payer: 59

## 2022-07-14 ENCOUNTER — Inpatient Hospital Stay: Payer: 59

## 2022-07-14 ENCOUNTER — Other Ambulatory Visit: Payer: Self-pay | Admitting: Gastroenterology

## 2022-07-14 DIAGNOSIS — D500 Iron deficiency anemia secondary to blood loss (chronic): Secondary | ICD-10-CM

## 2022-07-14 DIAGNOSIS — K7460 Unspecified cirrhosis of liver: Secondary | ICD-10-CM

## 2022-07-14 DIAGNOSIS — M6281 Muscle weakness (generalized): Secondary | ICD-10-CM | POA: Diagnosis not present

## 2022-07-14 LAB — CBC
HCT: 20.8 % — ABNORMAL LOW (ref 36.0–46.0)
Hemoglobin: 6.6 g/dL — CL (ref 12.0–15.0)
MCH: 24.7 pg — ABNORMAL LOW (ref 26.0–34.0)
MCHC: 31.7 g/dL (ref 30.0–36.0)
MCV: 77.9 fL — ABNORMAL LOW (ref 80.0–100.0)
Platelets: 146 10*3/uL — ABNORMAL LOW (ref 150–400)
RBC: 2.67 MIL/uL — ABNORMAL LOW (ref 3.87–5.11)
RDW: 19.9 % — ABNORMAL HIGH (ref 11.5–15.5)
WBC: 3.3 10*3/uL — ABNORMAL LOW (ref 4.0–10.5)
nRBC: 0 % (ref 0.0–0.2)

## 2022-07-14 LAB — PREPARE RBC (CROSSMATCH)

## 2022-07-14 MED ORDER — HEPARIN SOD (PORK) LOCK FLUSH 100 UNIT/ML IV SOLN
500.0000 [IU] | Freq: Once | INTRAVENOUS | Status: AC
  Administered 2022-07-14: 500 [IU] via INTRAVENOUS

## 2022-07-14 MED ORDER — SODIUM CHLORIDE 0.9% FLUSH
10.0000 mL | INTRAVENOUS | Status: DC | PRN
  Administered 2022-07-14: 10 mL via INTRAVENOUS

## 2022-07-14 NOTE — Addendum Note (Signed)
Addended by: Benjiman Core D on: 07/14/2022 11:07 AM   Modules accepted: Orders

## 2022-07-14 NOTE — Progress Notes (Signed)
CRITICAL VALUE ALERT Critical value received:  HGB 6.6. Date of notification:  07-14-2022 Time of notification: 0955 am. Critical value read back:  Yes.   Nurse who received alert:  B.Chancellor Vanderloop RN.  MD notified time and response:  R. Pennington PA @ 1012 am secure chat. Patient will receive blood tomorrow.

## 2022-07-14 NOTE — Addendum Note (Signed)
Addended by: Benjiman Core D on: 07/14/2022 10:26 AM   Modules accepted: Orders

## 2022-07-15 ENCOUNTER — Ambulatory Visit
Admission: RE | Admit: 2022-07-15 | Discharge: 2022-07-15 | Disposition: A | Discharge status: Home / Self Care | Payer: 59 | Source: Ambulatory Visit | Attending: Gastroenterology | Admitting: Gastroenterology

## 2022-07-15 ENCOUNTER — Inpatient Hospital Stay: Payer: 59

## 2022-07-15 ENCOUNTER — Encounter (HOSPITAL_COMMUNITY): Payer: 59 | Admitting: Physical Therapy

## 2022-07-15 DIAGNOSIS — K7581 Nonalcoholic steatohepatitis (NASH): Secondary | ICD-10-CM | POA: Diagnosis not present

## 2022-07-15 DIAGNOSIS — K7460 Unspecified cirrhosis of liver: Secondary | ICD-10-CM | POA: Diagnosis not present

## 2022-07-15 DIAGNOSIS — R188 Other ascites: Secondary | ICD-10-CM | POA: Diagnosis not present

## 2022-07-15 MED ORDER — ALBUMIN HUMAN 25 % IV SOLN: 25.0000 g | Freq: Once | INTRAVENOUS | Status: AC

## 2022-07-15 MED ORDER — ALBUMIN HUMAN 25 % IV SOLN
25.0000 g | Freq: Once | INTRAVENOUS | Status: AC
  Administered 2022-07-15: 25 g via INTRAVENOUS

## 2022-07-15 MED ORDER — LIDOCAINE HCL (PF) 1 % IJ SOLN
10.0000 mL | Freq: Once | INTRAMUSCULAR | Status: AC
  Administered 2022-07-15: 10 mL via INTRADERMAL

## 2022-07-15 MED ORDER — ALBUMIN HUMAN 25 % IV SOLN
INTRAVENOUS | Status: AC
  Administered 2022-07-15: 25 g via INTRAVENOUS
  Filled 2022-07-15: qty 100

## 2022-07-15 MED ORDER — ALBUMIN HUMAN 25 % IV SOLN: 50.0000 g | Freq: Once | INTRAVENOUS | Status: DC

## 2022-07-15 MED ORDER — HEPARIN SOD (PORK) LOCK FLUSH 100 UNIT/ML IV SOLN
INTRAVENOUS | Status: AC
  Filled 2022-07-15: qty 5

## 2022-07-15 MED ORDER — ALBUMIN HUMAN 25 % IV SOLN
INTRAVENOUS | Status: AC
  Filled 2022-07-15: qty 100

## 2022-07-15 NOTE — Procedures (Signed)
PROCEDURE SUMMARY:  Successful image-guided paracentesis from the left lower abdomen.  Yielded 7.75L liters of amber fluid.  No immediate complications.  EBL = trace. Patient tolerated well.   Specimen was not sent for labs.  Please see imaging section of Epic for full dictation.   Lura Em PA-C 07/15/2022 12:31 PM

## 2022-07-16 ENCOUNTER — Inpatient Hospital Stay: Payer: 59

## 2022-07-16 ENCOUNTER — Ambulatory Visit (HOSPITAL_COMMUNITY): Payer: 59 | Admitting: Physical Therapy

## 2022-07-16 DIAGNOSIS — M6281 Muscle weakness (generalized): Secondary | ICD-10-CM | POA: Diagnosis not present

## 2022-07-16 DIAGNOSIS — R262 Difficulty in walking, not elsewhere classified: Secondary | ICD-10-CM

## 2022-07-16 DIAGNOSIS — D500 Iron deficiency anemia secondary to blood loss (chronic): Secondary | ICD-10-CM

## 2022-07-16 DIAGNOSIS — K7460 Unspecified cirrhosis of liver: Secondary | ICD-10-CM

## 2022-07-16 DIAGNOSIS — R2689 Other abnormalities of gait and mobility: Secondary | ICD-10-CM

## 2022-07-16 MED ORDER — SODIUM CHLORIDE 0.9% FLUSH
10.0000 mL | INTRAVENOUS | Status: AC | PRN
  Administered 2022-07-16: 10 mL

## 2022-07-16 MED ORDER — SODIUM CHLORIDE 0.9% IV SOLUTION
250.0000 mL | Freq: Once | INTRAVENOUS | Status: AC
  Administered 2022-07-16: 250 mL via INTRAVENOUS

## 2022-07-16 MED ORDER — HEPARIN SOD (PORK) LOCK FLUSH 100 UNIT/ML IV SOLN
500.0000 [IU] | Freq: Every day | INTRAVENOUS | Status: AC | PRN
  Administered 2022-07-16: 500 [IU]

## 2022-07-16 NOTE — Therapy (Signed)
OUTPATIENT PHYSICAL THERAPY TREATMENT Progress Note Reporting Period 06/16/2022 to 07/16/2022  See note below for Objective Data and Assessment of Progress/Goals.     Patient Name: Rachel Huber MRN: [id number] DOB:[birth year], 66 y.o., female Today's Date: 07/16/2022   PT End of Session - 07/16/22 1322     Visit Number 12    Number of Visits 20    Date for PT Re-Evaluation 08/15/22    Authorization Type [employer]H (no VL)    Progress Note Due on Visit 22    PT Start Time 1318    PT Stop Time 1415    PT Time Calculation (min) 57 min    Activity Tolerance Patient tolerated treatment well;Patient limited by pain;Patient limited by fatigue    Behavior During Therapy [facility] for tasks assessed/performed                   Past Medical History:  Diagnosis Date   Anxiety    Arthritis    Cirrhosis of liver ([place])    Diabetes mellitus without complication (HCC)    Dyspnea    DOE   GERD (gastroesophageal reflux disease)    Grade IV internal hemorrhoids    Contingency plan for any future admissions for severe anemia in the setting of persistent GI bleed:  nuclear medicine tagged RBC scan to assess for location of GI bleeding - if from rectum, would then proceed to angiogram with possible repeat embolization.  Please notify IR in this case.   Hepatitis    PAST   Hypertension    Neuropathy    Neuropathy, diabetic ([place])    Pneumonia    Sinus complaint    Sleep apnea    CPAP   Thalassemia minor 1992   Past Surgical History:  Procedure Laterality Date   ABDOMINAL HYSTERECTOMY     BREAST BIOPSY Right 02/15/2018   [place] bx 6-6:30 ribbon shape, ONE CORE FRAGMENT WITH FIBROSIS. ONE CORE FRAGMENT WITH PORTION OF A DILATED   BREAST BIOPSY Right 02/15/2018   [place] bx 9:00 heart shape, USUAL DUCTAL HYPERPLASIA   BREAST LUMPECTOMY Right 03/09/2018   Procedure: BREAST LUMPECTOMY x 2;  Surgeon: Benjamine Sprague, DO;  Location: [facility] ORS;  Service: General;  Laterality: Right;    CATARACT EXTRACTION W/PHACO Left 06/11/2016   Procedure: CATARACT EXTRACTION PHACO AND INTRAOCULAR LENS PLACEMENT (IOC);  Surgeon: Estill Cotta, MD;  Location: [facility] ORS;  Service: Ophthalmology;  Laterality: Left;  Lot # [id number] H US:01:38.6 AP%:26.4 CDE:44.15   CATARACT EXTRACTION W/PHACO Right 06/15/2018   Procedure: CATARACT EXTRACTION PHACO AND INTRAOCULAR LENS PLACEMENT (IOC);  Surgeon: Birder Robson, MD;  Location: [facility] ORS;  Service: Ophthalmology;  Laterality: Right;  [place] 00:38.2 CDE 4.23 Fluid Pack Lot # [id number]   COLONOSCOPIES     COLONOSCOPY WITH PROPOFOL N/A 10/10/2020   Procedure: COLONOSCOPY WITH PROPOFOL;  Surgeon: Lesly Rubenstein, MD;  Location: [facility] ENDOSCOPY;  Service: Endoscopy;  Laterality: N/A;   COLONOSCOPY WITH PROPOFOL N/A 11/20/2020   Procedure: COLONOSCOPY WITH PROPOFOL;  Surgeon: Lesly Rubenstein, MD;  Location: [facility] ENDOSCOPY;  Service: Endoscopy;  Laterality: N/A;  DM STAT CBC, BMP COVID POSITIVE 09/02/2020   COLONOSCOPY WITH PROPOFOL N/A 06/19/2022   Procedure: COLONOSCOPY WITH PROPOFOL;  Surgeon: Lesly Rubenstein, MD;  Location: [facility] ENDOSCOPY;  Service: Endoscopy;  Laterality: N/A;   CTR     ESOPHAGOGASTRODUODENOSCOPY (EGD) WITH PROPOFOL N/A 10/10/2020   Procedure: ESOPHAGOGASTRODUODENOSCOPY (EGD) WITH PROPOFOL;  Surgeon: Lesly Rubenstein, MD;  Location: [facility] ENDOSCOPY;  Service: Endoscopy;  Laterality: N/A;  COVID POSITIVE 10/08/2020   FLEXIBLE SIGMOIDOSCOPY N/A 02/06/2022   Procedure: FLEXIBLE SIGMOIDOSCOPY;  Surgeon: Lesly Rubenstein, MD;  Location: [facility] ENDOSCOPY;  Service: Endoscopy;  Laterality: N/A;  Patient requests anesthesia   IR ANGIOGRAM SELECTIVE EACH ADDITIONAL VESSEL  02/07/2022   IR ANGIOGRAM SELECTIVE EACH ADDITIONAL VESSEL  02/07/2022   IR ANGIOGRAM VISCERAL SELECTIVE  02/07/2022   IR EMBO ARTERIAL NOT HEMORR HEMANG INC GUIDE ROADMAPPING  02/07/2022   IR IMAGING GUIDED PORT INSERTION  06/13/2022   IR PARACENTESIS  12/17/2021    IR PARACENTESIS  03/25/2022   IR RADIOLOGIST EVAL & MGMT  03/18/2022   IR RADIOLOGIST EVAL & MGMT  05/07/2022   IR RADIOLOGIST EVAL & MGMT  05/16/2022   IR US GUIDE VASC ACCESS RIGHT  02/07/2022   JOINT REPLACEMENT     KNEE SURGERY Right 09/24/2017   plates and pins   TOTAL SHOULDER ARTHROPLASTY Right 09/27/2015   Patient Active Problem List   Diagnosis Date Noted   GI bleed 06/18/2022   Rectal bleeding 06/17/2022   Obesity (BMI 30-39.9) 06/17/2022   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 mellitus ([place]) 06/17/2022   Grade IV internal hemorrhoids 05/16/2022   ABLA (acute blood loss anemia) 03/20/2022   Acute on chronic blood loss anemia 03/05/2022   Thrombocytopenia ([place]) 03/05/2022   Fever 02/08/2022   Pancytopenia ([place]) 02/07/2022   GI bleeding 02/04/2022   Chronic kidney disease, stage 3b ([place]) 02/04/2022   Depression with anxiety 02/04/2022   Liver cirrhosis secondary to NASH ([place])    Acute GI hemorrhage 01/28/2022   AKI (acute kidney injury) ([place]) 01/27/2022   Insomnia 01/27/2022   Iron deficiency anemia due to chronic blood loss 02/13/2021   History of uterine cancer 02/13/2021   Decompensated hepatic cirrhosis ([place]) 10/25/2020   OSA on CPAP 10/25/2020   Glossitis 10/25/2020   Hyponatremia 10/08/2020   Acute respiratory failure with hypoxia ([place]) 09/10/2020   Pneumonia due to COVID-19 virus 09/08/2020   Hip fracture, right ([place]) 09/04/2020   Closed right hip fracture ([place]) 09/02/2020   Type 2 diabetes mellitus with hyperlipidemia ([place]) 09/02/2020   Thalassemia minor    HLD (hyperlipidemia)    Acute kidney injury superimposed on CKD ([place])    Depression    Fall at home, initial encounter    Nondisplaced fracture of greater trochanter of right femur, initial encounter for closed fracture ([place])     PCP:  [place] MD  REFERRING PROVIDER: Tinnie Gens, NP   REFERRING DIAG: PT eval/tx for strength and conditioning   THERAPY DIAG:  Muscle weakness  (generalized)  Other abnormalities of gait and mobility  Difficulty in walking, not elsewhere classified  Cirrhosis, non-alcoholic ([place])  Rationale for Evaluation and Treatment Rehabilitation  ONSET DATE: Chronic  SUBJECTIVE:   SUBJECTIVE STATEMENT:  Patient returns today following 3 week absence as she did not schedule appts due to all her other appts conflicting.  Pt states MD put her back on fluid pills.  States her her back pain after standing prevents her from increasing her activity. States she has not been as compliant with her HEP as she should.  Does the seated ones "sometimes" and standing ones, tries to do some exercise every day but not the full HEP.  Reports onging weakness.  PERTINENT HISTORY: Paracentesis each week (Tuesdays) with 8L of removal Falls, weakness,DM, R shoulder reverse replacement an Feb 2019 R knee pain ([place])   PAIN:  Are you  having pain? Yes: NPRS scale: 5/10 Pain location: knees BLE; feel tight Pain description: aching  Aggravating factors: use, movement, WB Relieving factors: rest, meds, non WB  PRECAUTIONS: Fall  WEIGHT BEARING RESTRICTIONS No  FALLS:  Has patient fallen in last 6 months? Yes. Number of falls 1  LIVING ENVIRONMENT: Lives with: lives with their family and lives with their spouse Lives in: House/apartment Stairs: No Has following equipment at home: Single point cane and Environmental consultant - 2 wheeled  OCCUPATION: Retired/ Disability   PLOF: Needs assistance with ADLs  PATIENT GOALS  Get to point of not having to use cane/ improve balance    OBJECTIVE:   DIAGNOSTIC FINDINGS: NA   COGNITION:  Overall cognitive status: Within functional limits for tasks assessed     SENSATION: [facility]   LOWER EXTREMITY MMT:  MMT Right eval Left eval Right 06/16/22 Left 06/16/22 Right 07/16/22 Left 07/16/22  Hip flexion 4 4 4- 4 4 4   Hip extension        Hip abduction        Hip adduction        Hip internal rotation        Hip  external rotation        Knee flexion 4 4      Knee extension 4 4 4+ 4+ 4+ 4+  Ankle dorsiflexion 4 4 4+ 4+ 4+ 4+  Ankle plantarflexion        Ankle inversion        Ankle eversion         (Blank rows = not tested)  FUNCTIONAL TESTS:   Evaluation: 5 times sit to stand: 28.5 second with use of hands  2 minute walk test: 170 feet  06/16/22 5 x sit to stand 56 sec using hands; a couple times needs more than one attempt to stand 5 x sit to stand 2nd attempt; 33 sec using hands to assist up but improved form Unable to complete 2MWT    07/16/22   5X sit to stand from standard chair with 2" black foam for riser, no UE 27.33 sec   2 MWT with RW 135 feet   GAIT: Distance walked: 170 feet Assistive device utilized: Environmental consultant - 2 wheeled Level of assistance: Modified independence and SBA Comments: flexed trunk, decreased stride  TODAY'S TREATMENT: 07/16/22 Functional test measures 5X sit to stand from standard chair with 2" black foam for riser, no UE 27.33 sec 2 MWT with RW 135 feet  Ambulation with RW 270 feet Standing: hip abduction 2X10  Hip extension 2X10  Marching 2X10 Seated: Red theraband scap retraction 10X   Rows RTB 10X   Extensions RTB 10X  06/25/22 Standing: Heel raises 2 x 10 Hip abduction 2 x 10 Hip extension 2  x 10 Marching 2 x 10  LAQ's 2 x 10 2# each  06/16/22 5 x sit to stand 56 sec using hands; a couple times needs more than one attempt to stand 5 x sit to stand 2nd attempt; 33 sec using hands to assist up but improved form Progress note 2 MWT unable today Seated  Heel/toe raises x 20 LAQ's 2" hold 2# x 20 each Marching 2#  x 20`  10.23.23 O2 sats monitored Seated:  LAQ 10X3" each (84-92%)  STS from standard chair with black foam no UE 10X (84% after completing 5/rest and complete 5X more after return to 92%)  Standing:  with UE assist   Alternating marching 2X10   Hip abduction  2X10   Minisquats 10X  06/06/22 Standing: Heel raises 2 x  10 Hip abduction 2 x 10 each Sidestepping table length x 3 down and back Mini squats x 10 Tandem stance 2 x 20" each  05/30/22  2MWT 172f with RW  STS 10x elevated height for no HHA needed  Heel raises  Toe raises  Marching  Squat  Educated on benefits of compression garments and use of butler to assist with donning. Measurements taken and given handout for ETI.  05/26/22 Nustep (seat 12) lv 2 4 min dynamic warmup  Step up 6 inch x 10 HHA x 2 Clinic ambulation 170 feet with RW  Sit to stand x10   05/23/22            Nustep hills 3 x 6' level 3            Step up 6" x 10 B            Marching x 15             Minisquat x 10             Side step at // with green theraband  x 3 RT            Sit to stand x 10 with green theraband      05/19/22                        Standing:  Heel raises  20X                         Toeraises 20X Marching 10X 2 sets 1 HHA with intermittent 2HHA                        Mini squat 10X 2 sets                         Side step at // x 2 RT    Sit to stand x10 no UE's from standard chair, no UE    Ambulation around clinic with RW for activity tolerance X150 feet without rest                     05/16/22                        Standing:                        Heel raises x 10                        Marching x 10                         Mini squat x 10                         Side step at // x 2 RT                         Sit to stand x10   PATIENT EDUCATION:  Education details: on eval findings, POC and HEP  Person educated: Patient Education method: Explanation Education comprehension: verbalized understanding   HOME EXERCISE PROGRAM:  07/16/22  Seated theraband exercises RTB (rows, retraction, extension)  05/30/22:  Begin walking program with RW  04/19/22 - Heel Raises with Counter Support  - 2 x daily - 7 x weekly - 1 sets - 10 reps - 3-5" hold - Mini Squat with Counter Support  - 2 x daily - 7 x weekly - 1 sets - 10  reps - 3-5" hold - Sit to Stand with Counter Support  - 2 x daily - 7 x weekly - 1 sets - 10 reps9/              Access Code: [email or web address] URL: [email or web address] Date: 05/01/2022 Prepared by: Josue Hector  Exercises - Seated Heel Toe Raises  - 2 x daily - 7 x weekly - 1 sets - 10 reps - Seated Long Arc Quad  - 2 x daily - 7 x weekly - 1 sets - 10 reps - Seated March  - 2 x daily - 7 x weekly - 1 sets - 10 reps - Supine Active Straight Leg Raise  - 7 x weekly - 1 sets - 10 reps  ASSESSMENT:  CLINICAL IMPRESSION: Patient returns today following 3 weeks since last visit and then was returning following hospitalization for GI bleed (hemorrhoids) resulting in transfusion.  Patient due for re-certification with progress note completed.  Functional test measures completed.  Pt has made good progress in regard to last reassessment, however close to initial test measures when began therapy.  Pt has had a multitude of medical issues while she has been attending therapy that has halted progress.  Pt with less fatigue today and without any noted shortness of breath during session today.  Pt would like to continue until the end of the year with hopes to be discharged on strong HEP with overall improved functional mobility and strength.  Pt given RTB and written instructions to help increase postural strength.  Patient will continue to benefit from continued skilled therapy services  to address deficits and promote return to optimal function X 4 more weeks.     OBJECTIVE IMPAIRMENTS Abnormal gait, decreased activity tolerance, decreased balance, decreased mobility, difficulty walking, decreased ROM, decreased strength, improper body mechanics, and pain.   ACTIVITY LIMITATIONS lifting, bending, standing, squatting, stairs, transfers, and locomotion level  PARTICIPATION LIMITATIONS: meal prep, cleaning, laundry, shopping, community activity, and yard work  PERSONAL FACTORS Past/current  experiences and Time since onset of injury/illness/exacerbation are also affecting patient's functional outcome.   REHAB POTENTIAL: Good  CLINICAL DECISION MAKING: Stable/uncomplicated  EVALUATION COMPLEXITY: Low   GOALS: SHORT TERM GOALS: Target date: 05/29/2022  Patient will be independent with initial HEP and self-management strategies to improve functional outcomes Baseline:  Goal status: IN PROGRESS  2. Patient will be able to stand, with minimal support, at least 5 minutes for improved ability to perform meal preparation/ cooking/ grooming/ cleaning ADLs.  Baseline: 2.5 minutes Goal status: IN PROGRESS  LONG TERM GOALS: Target date: 07/14/2022  Patient will be independent with advanced HEP and self-management strategies to improve functional outcomes Baseline:  Goal status: IN PROGRESS  2.  Patient will report at least 65% overall improvement in subjective complaint to indicate improvement in ability to perform ADLs. Baseline:  Goal status: IN PROGRESS  3.  Patient will be able to stand, with minimal support, at least 10 minutes for improved ability to perform meal preparation/ cooking/ grooming/ cleaning ADLs.  Baseline: 2.5 minutes Goal status: IN PROGRESS  4. Patient will have equal to or > 4+/5  MMT throughout BLE to improve ability to perform functional mobility, stair ambulation and ADLs.  Baseline: See MMT Goal status: IN PROGRESS  5. Patient will be able to ambulate at least 300 feet during 2MWT with LRAD to demonstrate improved ability to perform functional mobility and associated tasks. Baseline: 170 feet Goal status: IN PROGRESS  PLAN: PT FREQUENCY: 2x/week  PT DURATION: 4 more weeks  PLANNED INTERVENTIONS: Therapeutic exercises, Therapeutic activity, Neuromuscular re-education, Balance training, Gait training, Patient/Family education, Joint manipulation, Joint mobilization, Stair training, Aquatic Therapy, Dry Needling, Electrical stimulation, Spinal  manipulation, Spinal mobilization, Cryotherapy, Moist heat, scar mobilization, Taping, Traction, Ultrasound, Biofeedback, Ionotophoresis 73m/ml Dexamethasone, and Manual therapy.  PLAN FOR NEXT SESSION: continue 2X week for 4 more weeks.  Progress functional LE strength, work on ambulation tolerance. balance  2:30 PM, 07/16/22 ATeena Irani PTA/CLT CPiedmontPh: [phone] FTeena Irani PTA 07/16/2022, 2:30 PM

## 2022-07-16 NOTE — Patient Instructions (Signed)
[facility]  Discharge Instructions: Thank you for choosing [facility] to provide your oncology and hematology care.  If you have a lab appointment with the [facility], please come in thru the Main Entrance and check in at the main information desk.  Wear comfortable clothing and clothing appropriate for easy access to any Portacath or PICC line.   We strive to give you quality time with your provider. You may need to reschedule your appointment if you arrive late (15 or more minutes).  Arriving late affects you and other patients whose appointments are after yours.  Also, if you miss three or more appointments without notifying the office, you may be dismissed from the clinic at the provider's discretion.      For prescription refill requests, have your pharmacy contact our office and allow 72 hours for refills to be completed.    Today you received 1 unit of blood     BELOW ARE SYMPTOMS THAT SHOULD BE REPORTED IMMEDIATELY: *FEVER GREATER THAN 100.4 F (38 C) OR HIGHER *CHILLS OR SWEATING *NAUSEA AND VOMITING THAT IS NOT CONTROLLED WITH YOUR NAUSEA MEDICATION *UNUSUAL SHORTNESS OF BREATH *UNUSUAL BRUISING OR BLEEDING *URINARY PROBLEMS (pain or burning when urinating, or frequent urination) *BOWEL PROBLEMS (unusual diarrhea, constipation, pain near the anus) TENDERNESS IN MOUTH AND THROAT WITH OR WITHOUT PRESENCE OF ULCERS (sore throat, sores in mouth, or a toothache) UNUSUAL RASH, SWELLING OR PAIN  UNUSUAL VAGINAL DISCHARGE OR ITCHING   Items with * indicate a potential emergency and should be followed up as soon as possible or go to the Emergency Department if any problems should occur.  Please show the CHEMOTHERAPY ALERT CARD or IMMUNOTHERAPY ALERT CARD at check-in to the Emergency Department and triage nurse.  Should you have questions after your visit or need to cancel or reschedule your appointment, please contact [facility] [phone]  and follow the prompts.  Office hours are 8:00 a.m. to 4:30 p.m. Monday - Friday. Please note that voicemails left after 4:00 p.m. may not be returned until the following business day.  We are closed weekends and major holidays. You have access to a nurse at all times for urgent questions. Please call the main number to the clinic [phone] and follow the prompts.  For any non-urgent questions, you may also contact your provider using MyChart. We now offer e-Visits for anyone 49 and older to request care online for non-urgent symptoms. For details visit [email or web address].   Also download the MyChart app! Go to the app store, search "MyChart", open the app, select [facility], and log in with your MyChart username and password.  Masks are optional in the cancer centers. If you would like for your care team to wear a mask while they are taking care of you, please let them know. You may have one support person who is at least 64 years old accompany you for your appointments.

## 2022-07-16 NOTE — Progress Notes (Signed)
Pt presents today for 1 unit of blood per provider's order. Vital signs stable and pt voiced no new complaints at this time.  1 unit of blood given today per MD orders. Tolerated infusion without adverse affects. Vital signs stable. No complaints at this time. Discharged from clinic via wheelchair in stable condition. Alert and oriented x 3. F/U with [facility] as scheduled.

## 2022-07-17 ENCOUNTER — Other Ambulatory Visit: Payer: Self-pay | Admitting: Gastroenterology

## 2022-07-17 ENCOUNTER — Ambulatory Visit (HOSPITAL_COMMUNITY): Payer: 59 | Admitting: Physical Therapy

## 2022-07-17 ENCOUNTER — Inpatient Hospital Stay: Payer: 59

## 2022-07-17 DIAGNOSIS — K7460 Unspecified cirrhosis of liver: Secondary | ICD-10-CM

## 2022-07-17 DIAGNOSIS — M6281 Muscle weakness (generalized): Secondary | ICD-10-CM | POA: Diagnosis not present

## 2022-07-17 DIAGNOSIS — R2689 Other abnormalities of gait and mobility: Secondary | ICD-10-CM

## 2022-07-17 DIAGNOSIS — R262 Difficulty in walking, not elsewhere classified: Secondary | ICD-10-CM

## 2022-07-17 LAB — BPAM RBC
Blood Product Expiration Date: 202312222359
ISSUE DATE / TIME: 202311291051
Unit Type and Rh: 6200

## 2022-07-17 LAB — TYPE AND SCREEN
ABO/RH(D): A POS
Antibody Screen: NEGATIVE
Unit division: 0

## 2022-07-17 NOTE — Addendum Note (Signed)
Addended byDonnal Debar, Naoki Migliaccio S on: 07/17/2022 11:10 AM   Modules accepted: Orders

## 2022-07-17 NOTE — Therapy (Signed)
OUTPATIENT PHYSICAL THERAPY TREATMENT  Patient Name: AVARI NEVARES MRN: [id number] DOB:July 27, 1956, 66 y.o., female Today's Date: 07/17/2022   PT End of Session - 07/17/22 1506     Visit Number 13    Number of Visits 20    Date for PT Re-Evaluation 08/15/22    Authorization Type [employer] QH (no VL)    Progress Note Due on Visit 22    PT Start Time 7169    PT Stop Time 1348    PT Time Calculation (min) 43 min    Activity Tolerance Patient tolerated treatment well;Patient limited by pain;Patient limited by fatigue    Behavior During Therapy [facility] for tasks assessed/performed                   Past Medical History:  Diagnosis Date   Anxiety    Arthritis    Cirrhosis of liver ([place])    Diabetes mellitus without complication (HCC)    Dyspnea    DOE   GERD (gastroesophageal reflux disease)    Grade IV internal hemorrhoids    Contingency plan for any future admissions for severe anemia in the setting of persistent GI bleed:  nuclear medicine tagged RBC scan to assess for location of GI bleeding - if from rectum, would then proceed to angiogram with possible repeat embolization.  Please notify IR in this case.   Hepatitis    PAST   Hypertension    Neuropathy    Neuropathy, diabetic ([place])    Pneumonia    Sinus complaint    Sleep apnea    CPAP   Thalassemia minor 1992   Past Surgical History:  Procedure Laterality Date   ABDOMINAL HYSTERECTOMY     BREAST BIOPSY Right 02/15/2018   [place] bx 6-6:30 ribbon shape, ONE CORE FRAGMENT WITH FIBROSIS. ONE CORE FRAGMENT WITH PORTION OF A DILATED   BREAST BIOPSY Right 02/15/2018   [place] bx 9:00 heart shape, USUAL DUCTAL HYPERPLASIA   BREAST LUMPECTOMY Right 03/09/2018   Procedure: BREAST LUMPECTOMY x 2;  Surgeon: Benjamine Sprague, DO;  Location: [facility] ORS;  Service: General;  Laterality: Right;   CATARACT EXTRACTION W/PHACO Left 06/11/2016   Procedure: CATARACT EXTRACTION PHACO AND INTRAOCULAR LENS PLACEMENT (IOC);  Surgeon: Estill Cotta, MD;  Location: [facility] ORS;  Service: Ophthalmology;  Laterality: Left;  Lot # [id number] H US:01:38.6 AP%:26.4 CDE:44.15   CATARACT EXTRACTION W/PHACO Right 06/15/2018   Procedure: CATARACT EXTRACTION PHACO AND INTRAOCULAR LENS PLACEMENT (IOC);  Surgeon: Birder Robson, MD;  Location: [facility] ORS;  Service: Ophthalmology;  Laterality: Right;  [place] 00:38.2 CDE 4.23 Fluid Pack Lot # [id number]   COLONOSCOPIES     COLONOSCOPY WITH PROPOFOL N/A 10/10/2020   Procedure: COLONOSCOPY WITH PROPOFOL;  Surgeon: Lesly Rubenstein, MD;  Location: [facility] ENDOSCOPY;  Service: Endoscopy;  Laterality: N/A;   COLONOSCOPY WITH PROPOFOL N/A 11/20/2020   Procedure: COLONOSCOPY WITH PROPOFOL;  Surgeon: Lesly Rubenstein, MD;  Location: [facility] ENDOSCOPY;  Service: Endoscopy;  Laterality: N/A;  DM STAT CBC, BMP COVID POSITIVE 09/02/2020   COLONOSCOPY WITH PROPOFOL N/A 06/19/2022   Procedure: COLONOSCOPY WITH PROPOFOL;  Surgeon: Lesly Rubenstein, MD;  Location: [facility] ENDOSCOPY;  Service: Endoscopy;  Laterality: N/A;   CTR     ESOPHAGOGASTRODUODENOSCOPY (EGD) WITH PROPOFOL N/A 10/10/2020   Procedure: ESOPHAGOGASTRODUODENOSCOPY (EGD) WITH PROPOFOL;  Surgeon: Lesly Rubenstein, MD;  Location: [facility] ENDOSCOPY;  Service: Endoscopy;  Laterality: N/A;  COVID POSITIVE 10/08/2020   FLEXIBLE SIGMOIDOSCOPY N/A 02/06/2022   Procedure: FLEXIBLE SIGMOIDOSCOPY;  Surgeon: Lesly Rubenstein, MD;  Location: [facility] ENDOSCOPY;  Service: Endoscopy;  Laterality: N/A;  Patient requests anesthesia   IR ANGIOGRAM SELECTIVE EACH ADDITIONAL VESSEL  02/07/2022   IR ANGIOGRAM SELECTIVE EACH ADDITIONAL VESSEL  02/07/2022   IR ANGIOGRAM VISCERAL SELECTIVE  02/07/2022   IR EMBO ARTERIAL NOT HEMORR HEMANG INC GUIDE ROADMAPPING  02/07/2022   IR IMAGING GUIDED PORT INSERTION  06/13/2022   IR PARACENTESIS  12/17/2021   IR PARACENTESIS  03/25/2022   IR RADIOLOGIST EVAL & MGMT  03/18/2022   IR RADIOLOGIST EVAL & MGMT  05/07/2022   IR RADIOLOGIST EVAL & MGMT   05/16/2022   IR US GUIDE VASC ACCESS RIGHT  02/07/2022   JOINT REPLACEMENT     KNEE SURGERY Right 09/24/2017   plates and pins   TOTAL SHOULDER ARTHROPLASTY Right 09/27/2015   Patient Active Problem List   Diagnosis Date Noted   GI bleed 06/18/2022   Rectal bleeding 06/17/2022   Obesity (BMI 30-39.9) 06/17/2022   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 mellitus ([place]) 06/17/2022   Grade IV internal hemorrhoids 05/16/2022   ABLA (acute blood loss anemia) 03/20/2022   Acute on chronic blood loss anemia 03/05/2022   Thrombocytopenia ([place]) 03/05/2022   Fever 02/08/2022   Pancytopenia ([place]) 02/07/2022   GI bleeding 02/04/2022   Chronic kidney disease, stage 3b ([place]) 02/04/2022   Depression with anxiety 02/04/2022   Liver cirrhosis secondary to NASH ([place])    Acute GI hemorrhage 01/28/2022   AKI (acute kidney injury) ([place]) 01/27/2022   Insomnia 01/27/2022   Iron deficiency anemia due to chronic blood loss 02/13/2021   History of uterine cancer 02/13/2021   Decompensated hepatic cirrhosis ([place]) 10/25/2020   OSA on CPAP 10/25/2020   Glossitis 10/25/2020   Hyponatremia 10/08/2020   Acute respiratory failure with hypoxia ([place]) 09/10/2020   Pneumonia due to COVID-19 virus 09/08/2020   Hip fracture, right ([place]) 09/04/2020   Closed right hip fracture ([place]) 09/02/2020   Type 2 diabetes mellitus with hyperlipidemia ([place]) 09/02/2020   Thalassemia minor    HLD (hyperlipidemia)    Acute kidney injury superimposed on CKD ([place])    Depression    Fall at home, initial encounter    Nondisplaced fracture of greater trochanter of right femur, initial encounter for closed fracture ([place])     PCP:  Richards MD  REFERRING PROVIDER: Tinnie Gens, NP   REFERRING DIAG: PT eval/tx for strength and conditioning   THERAPY DIAG:  Muscle weakness (generalized)  Other abnormalities of gait and mobility  Difficulty in walking, not elsewhere classified  Cirrhosis, non-alcoholic  ([place])  Rationale for Evaluation and Treatment Rehabilitation  ONSET DATE: Chronic  SUBJECTIVE:   SUBJECTIVE STATEMENT:  Patient states she slept til 12 but didn't go to bed until 3am.  States this is normal for her and her husband.  No pain or issues this evening.   PERTINENT HISTORY: Paracentesis each week (Tuesdays) with 8L of removal Falls, weakness,DM, R shoulder reverse replacement an Feb 2019 R knee pain ([place])   PAIN:  Are you having pain? Yes: NPRS scale: 0/10 Pain location: knees BLE; feel tight Pain description: aching  Aggravating factors: use, movement, WB Relieving factors: rest, meds, non WB  PRECAUTIONS: Fall  WEIGHT BEARING RESTRICTIONS No  FALLS:  Has patient fallen in last 6 months? Yes. Number of falls 1  LIVING ENVIRONMENT: Lives with: lives with their family and lives with their spouse Lives in: House/apartment Stairs: No Has following equipment  at home: Single point cane and Walker - 2 wheeled  OCCUPATION: Retired/ Disability   PLOF: Needs assistance with ADLs  PATIENT GOALS  Get to point of not having to use cane/ improve balance    OBJECTIVE:   DIAGNOSTIC FINDINGS: NA   COGNITION:  Overall cognitive status: Within functional limits for tasks assessed     SENSATION: [facility]   LOWER EXTREMITY MMT:  MMT Right eval Left eval Right 06/16/22 Left 06/16/22 Right 07/16/22 Left 07/16/22  Hip flexion 4 4 4- 4 4 4   Hip extension        Hip abduction        Hip adduction        Hip internal rotation        Hip external rotation        Knee flexion 4 4      Knee extension 4 4 4+ 4+ 4+ 4+  Ankle dorsiflexion 4 4 4+ 4+ 4+ 4+  Ankle plantarflexion        Ankle inversion        Ankle eversion         (Blank rows = not tested)  FUNCTIONAL TESTS:   Evaluation: 5 times sit to stand: 28.5 second with use of hands  2 minute walk test: 170 feet  06/16/22 5 x sit to stand 56 sec using hands; a couple times needs more than one attempt to  stand 5 x sit to stand 2nd attempt; 33 sec using hands to assist up but improved form Unable to complete 2MWT    07/16/22   5X sit to stand from standard chair with 2" black foam for riser, no UE 27.33 sec   2 MWT with RW 135 feet   GAIT: Distance walked: 170 feet Assistive device utilized: Environmental consultant - 2 wheeled Level of assistance: Modified independence and SBA Comments: flexed trunk, decreased stride  TODAY'S TREATMENT: 07/17/22 Standing hip abduction with RTB at ankles 2X10 each  Hip extension with RTB at ankles 2X10 each  Marching high holds 2X10 RTB at ankles  Seated: sit to stands no UE 2X10 Ambulation from room to main entrance RW (150') no rest at EOS    07/16/22 Functional test measures 5X sit to stand from standard chair with 2" black foam for riser, no UE 27.33 sec 2 MWT with RW 135 feet  Ambulation with RW 270 feet Standing: hip abduction 2X10  Hip extension 2X10  Marching 2X10 Seated: Red theraband scap retraction 10X   Rows RTB 10X   Extensions RTB 10X  06/25/22 Standing: Heel raises 2 x 10 Hip abduction 2 x 10 Hip extension 2  x 10 Marching 2 x 10  LAQ's 2 x 10 2# each  06/16/22 5 x sit to stand 56 sec using hands; a couple times needs more than one attempt to stand 5 x sit to stand 2nd attempt; 33 sec using hands to assist up but improved form Progress note 2 MWT unable today Seated  Heel/toe raises x 20 LAQ's 2" hold 2# x 20 each Marching 2#  x 20`  10.23.23 O2 sats monitored Seated:  LAQ 10X3" each (84-92%)  STS from standard chair with black foam no UE 10X (84% after completing 5/rest and complete 5X more after return to 92%)  Standing:  with UE assist   Alternating marching 2X10   Hip abduction 2X10   Minisquats 10X  06/06/22 Standing: Heel raises 2 x 10 Hip abduction 2 x 10 each Sidestepping table length  x 3 down and back Mini squats x 10 Tandem stance 2 x 20" each  05/30/22  2MWT 181f with RW  STS 10x elevated height for no  HHA needed  Heel raises  Toe raises  Marching  Squat  Educated on benefits of compression garments and use of butler to assist with donning. Measurements taken and given handout for ETI.  05/26/22 Nustep (seat 12) lv 2 4 min dynamic warmup  Step up 6 inch x 10 HHA x 2 Clinic ambulation 170 feet with RW  Sit to stand x10   05/23/22            Nustep hills 3 x 6' level 3            Step up 6" x 10 B            Marching x 15             Minisquat x 10             Side step at // with green theraband  x 3 RT            Sit to stand x 10 with green theraband      05/19/22                        Standing:  Heel raises  20X                         Toeraises 20X Marching 10X 2 sets 1 HHA with intermittent 2HHA                        Mini squat 10X 2 sets                         Side step at // x 2 RT    Sit to stand x10 no UE's from standard chair, no UE    Ambulation around clinic with RW for activity tolerance X150 feet without rest                     05/16/22                        Standing:                        Heel raises x 10                        Marching x 10                         Mini squat x 10                         Side step at // x 2 RT                         Sit to stand x10   PATIENT EDUCATION:  Education details: on eval findings, POC and HEP  Person educated: Patient Education method: Explanation Education comprehension: verbalized understanding   HOME EXERCISE PROGRAM:            07/16/22  Seated theraband exercises RTB (rows, retraction, extension)  05/30/22:  Begin walking program with RW  04/19/22 - Heel Raises with Counter Support  - 2 x daily - 7 x weekly - 1 sets - 10 reps - 3-5" hold - Mini Squat with Counter Support  - 2 x daily - 7 x weekly - 1 sets - 10 reps - 3-5" hold - Sit to Stand with Counter Support  - 2 x daily - 7 x weekly - 1 sets - 10 reps9/              Access Code: [email or web address] URL: [email or web address] Date:  05/01/2022 Prepared by: Josue Hector  Exercises - Seated Heel Toe Raises  - 2 x daily - 7 x weekly - 1 sets - 10 reps - Seated Long Arc Quad  - 2 x daily - 7 x weekly - 1 sets - 10 reps - Seated March  - 2 x daily - 7 x weekly - 1 sets - 10 reps - Supine Active Straight Leg Raise  - 7 x weekly - 1 sets - 10 reps  ASSESSMENT:  CLINICAL IMPRESSION: Began session with standing strenghtening.  Added RTB at ankles to increase resistance.  Also able to complete 2 sets of each activity today.   Ended session with ambulation from treatment area to main entrance.  Pt able to complete and remain standing while waiting in elevator for last 8 minutes of treatment.   Patient will continue to benefit from continued skilled therapy services  to address deficits and promote return to optimal function.    OBJECTIVE IMPAIRMENTS Abnormal gait, decreased activity tolerance, decreased balance, decreased mobility, difficulty walking, decreased ROM, decreased strength, improper body mechanics, and pain.   ACTIVITY LIMITATIONS lifting, bending, standing, squatting, stairs, transfers, and locomotion level  PARTICIPATION LIMITATIONS: meal prep, cleaning, laundry, shopping, community activity, and yard work  PERSONAL FACTORS Past/current experiences and Time since onset of injury/illness/exacerbation are also affecting patient's functional outcome.   REHAB POTENTIAL: Good  CLINICAL DECISION MAKING: Stable/uncomplicated  EVALUATION COMPLEXITY: Low   GOALS: SHORT TERM GOALS: Target date: 05/29/2022  Patient will be independent with initial HEP and self-management strategies to improve functional outcomes Baseline:  Goal status: IN PROGRESS  2. Patient will be able to stand, with minimal support, at least 5 minutes for improved ability to perform meal preparation/ cooking/ grooming/ cleaning ADLs.  Baseline: 2.5 minutes Goal status: IN PROGRESS  LONG TERM GOALS: Target date: 07/14/2022  Patient will be  independent with advanced HEP and self-management strategies to improve functional outcomes Baseline:  Goal status: IN PROGRESS  2.  Patient will report at least 65% overall improvement in subjective complaint to indicate improvement in ability to perform ADLs. Baseline:  Goal status: IN PROGRESS  3.  Patient will be able to stand, with minimal support, at least 10 minutes for improved ability to perform meal preparation/ cooking/ grooming/ cleaning ADLs.  Baseline: 2.5 minutes Goal status: IN PROGRESS  4. Patient will have equal to or > 4+/5 MMT throughout BLE to improve ability to perform functional mobility, stair ambulation and ADLs.  Baseline: See MMT Goal status: IN PROGRESS  5. Patient will be able to ambulate at least 300 feet during 2MWT with LRAD to demonstrate improved ability to perform functional mobility and associated tasks. Baseline: 170 feet Goal status: IN PROGRESS  PLAN: PT FREQUENCY: 2x/week  PT DURATION: 4 more weeks  PLANNED INTERVENTIONS: Therapeutic exercises, Therapeutic activity, Neuromuscular re-education, Balance training, Gait training, Patient/Family education, Joint manipulation, Joint mobilization, Stair training, Aquatic Therapy, Dry Needling,  Electrical stimulation, Spinal manipulation, Spinal mobilization, Cryotherapy, Moist heat, scar mobilization, Taping, Traction, Ultrasound, Biofeedback, Ionotophoresis 51m/ml Dexamethasone, and Manual therapy.  PLAN FOR NEXT SESSION: continue to progress functional LE strength, work on ambulation tolerance and balance.  ATeena Irani PTA/CLT CEmmetsburgPh: [phone] 3:08 PM, 07/17/22

## 2022-07-18 ENCOUNTER — Other Ambulatory Visit: Payer: Self-pay | Admitting: Internal Medicine

## 2022-07-18 DIAGNOSIS — Z1231 Encounter for screening mammogram for malignant neoplasm of breast: Secondary | ICD-10-CM

## 2022-07-19 IMAGING — US US PARACENTESIS
1 series · 7 of 7 positions shown · non-contrast
Comparison: none

INDICATION: Recurrent ascites request received for therapeutic paracentesis

[Series 1: us paracentesis · 0.26mm/px · 7 of 7 slices shown]
[im 1/7]
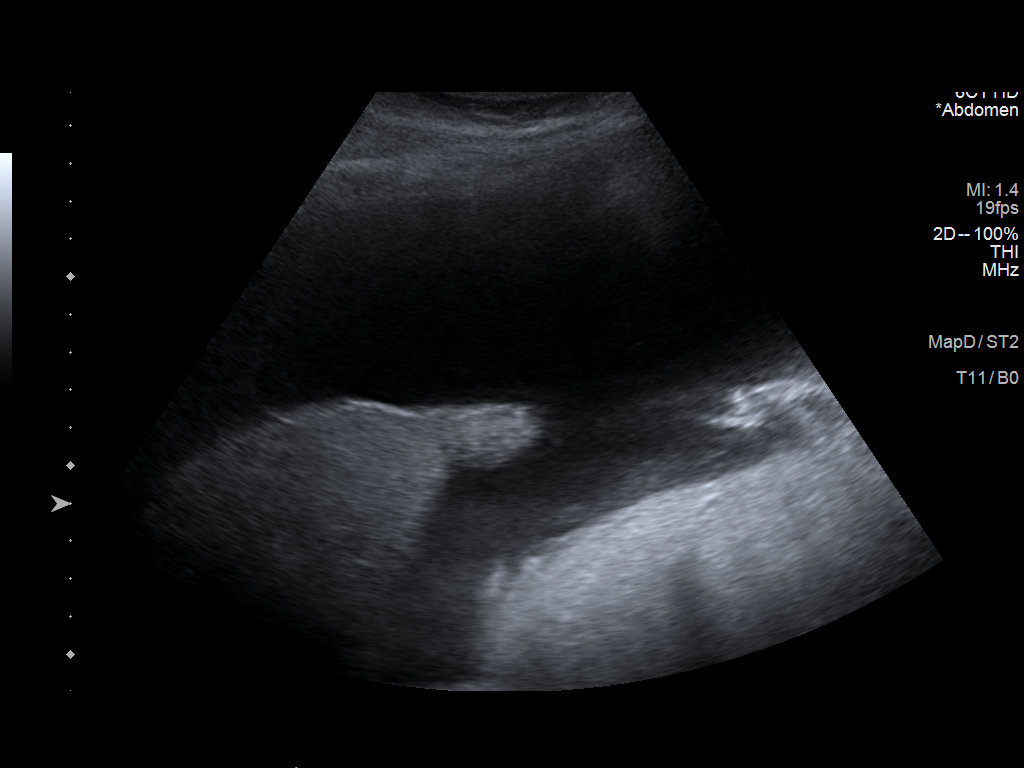
[im 2/7]
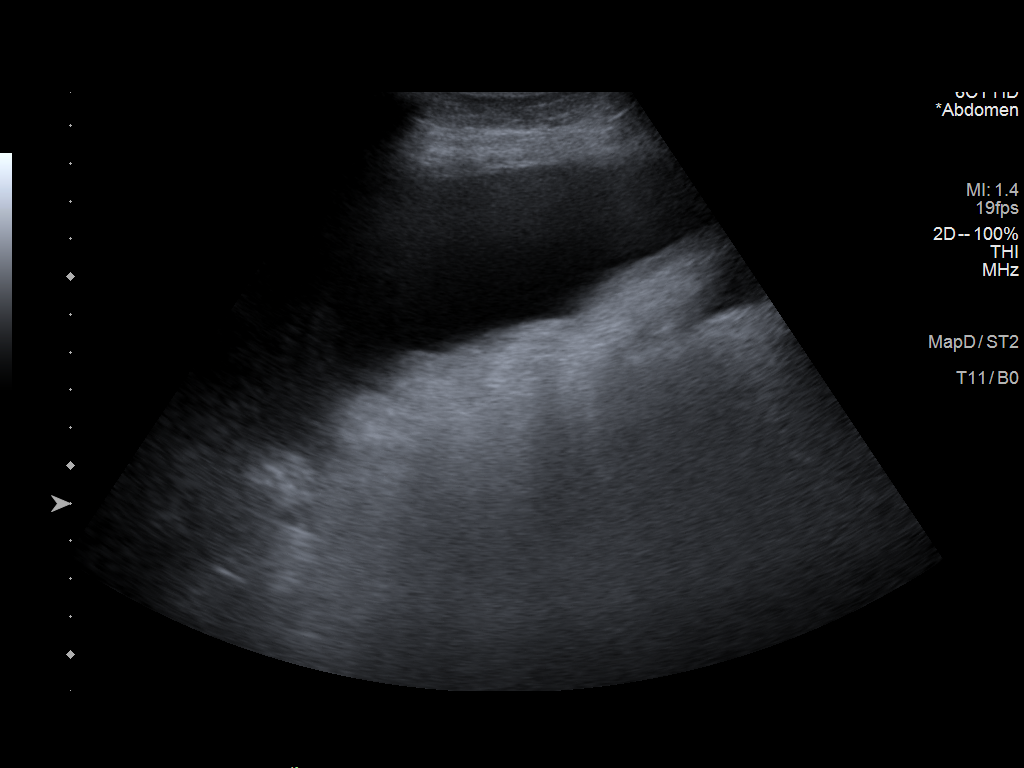
[im 3/7]
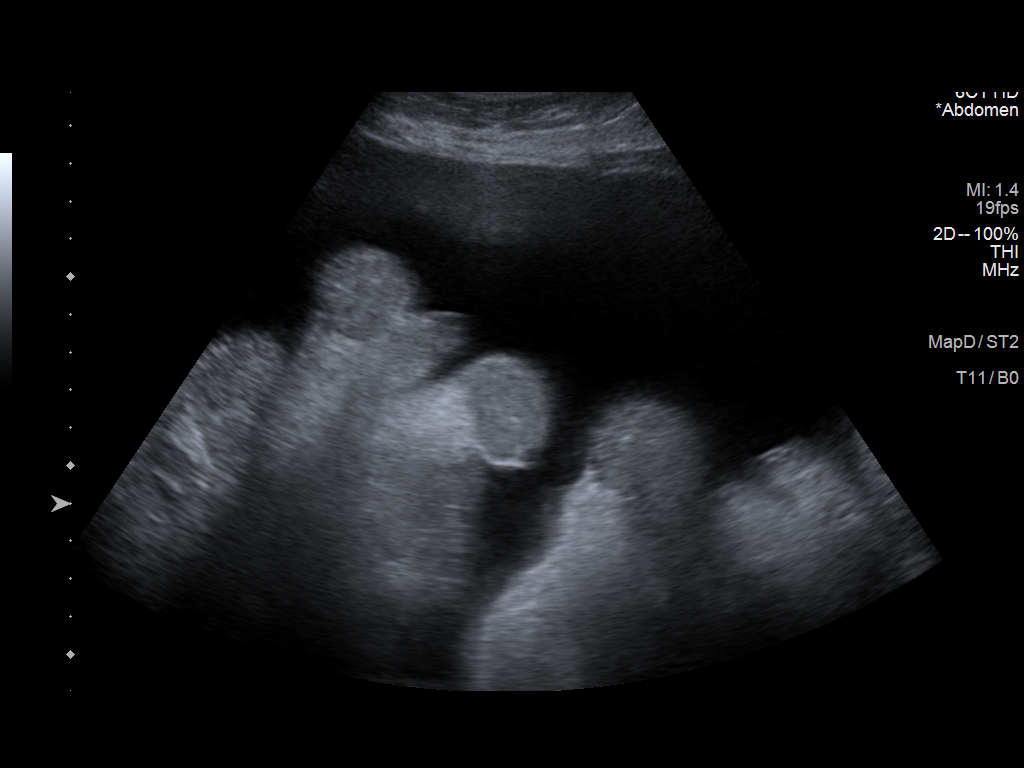
[im 4/7]
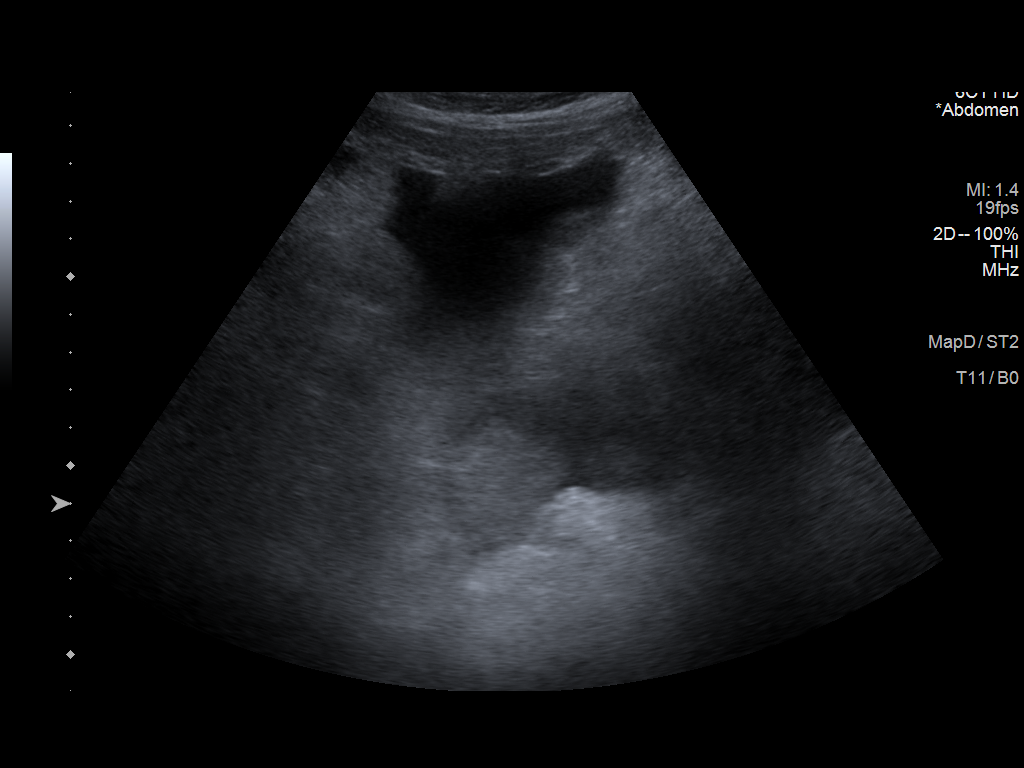
[im 5/7]
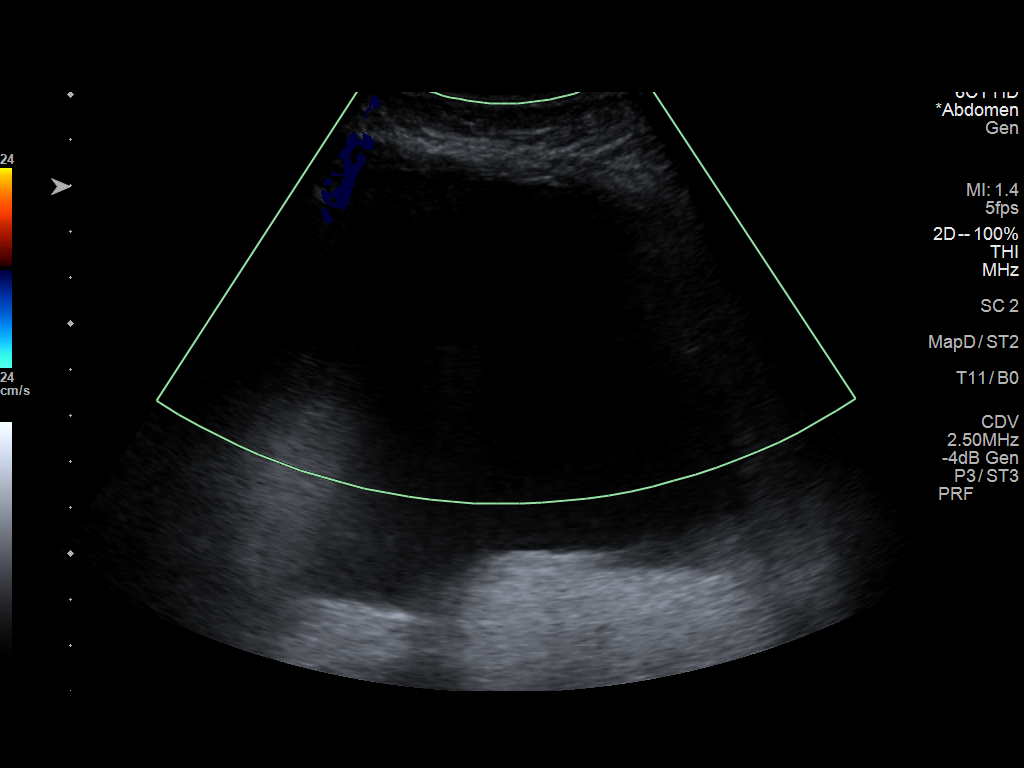
[im 6/7]
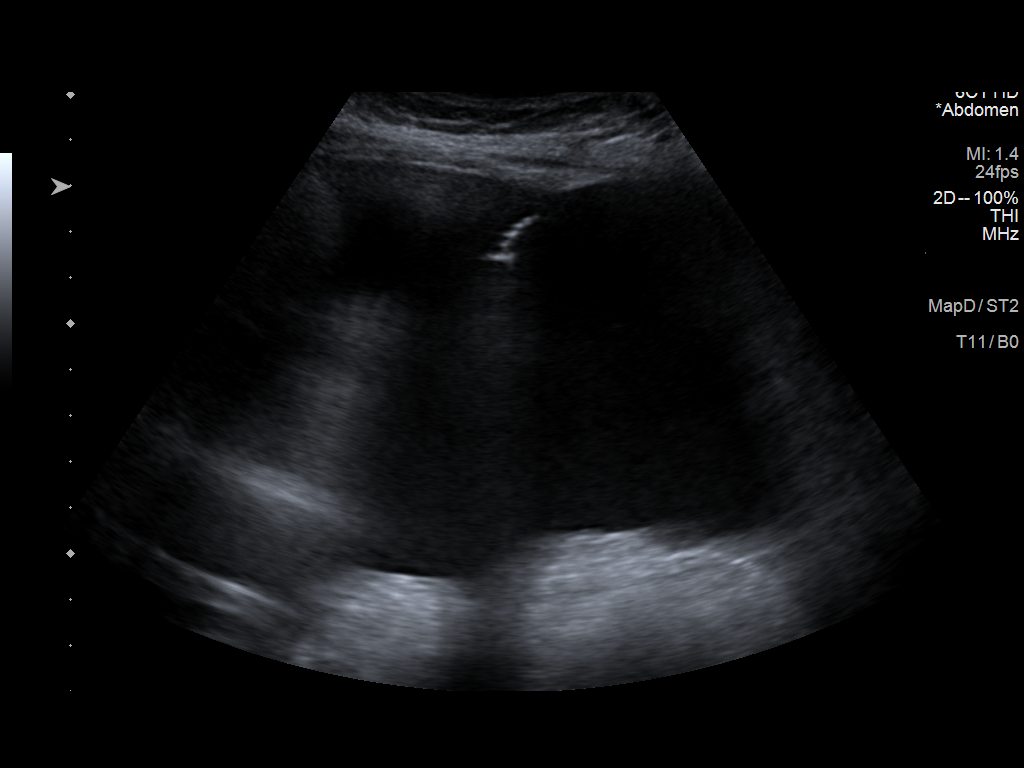
[im 7/7]
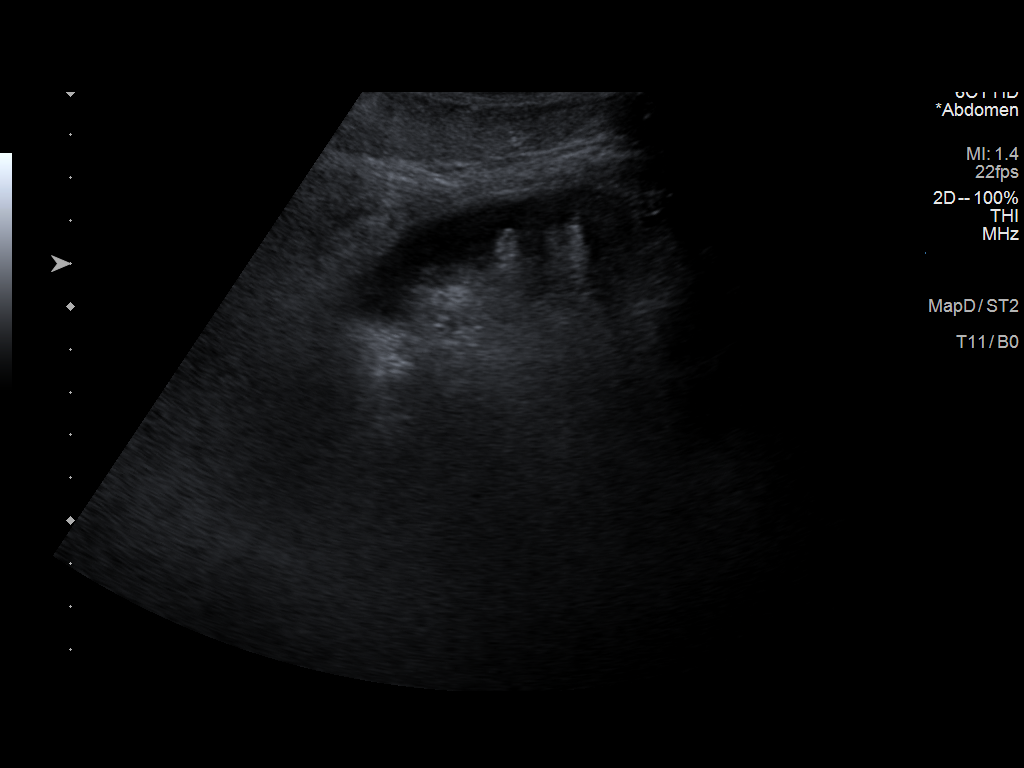

[7 of 7 positions shown; findings below may reference images not displayed]

EXAM:
ULTRASOUND GUIDED  PARACENTESIS

MEDICATIONS:
Local 1% lidocaine only.

COMPLICATIONS:
None immediate.

PROCEDURE:
Informed written consent was obtained from the patient after a
discussion of the risks, benefits and alternatives to treatment. A
timeout was performed prior to the initiation of the procedure.

Initial ultrasound scanning demonstrates a large amount of ascites
within the right lower abdominal quadrant. The right lower abdomen
was prepped and draped in the usual sterile fashion. 1% lidocaine
was used for local anesthesia.

Following this, a 19 gauge, 7-cm, Yueh catheter was introduced. An
ultrasound image was saved for documentation purposes. The
paracentesis was performed. The catheter was removed and a dressing
was applied. The patient tolerated the procedure well without
immediate post procedural complication.
Patient received post-procedure intravenous albumin; see nursing
notes for details.
FINDINGS: A total of approximately 12.4 L of clear yellow fluid was removed.
IMPRESSION: Successful ultrasound-guided paracentesis yielding 12.4 liters of
peritoneal fluid.

This exam was performed by Chayo Meredith, and was supervised
and interpreted by Dr. Jim.

## 2022-07-21 ENCOUNTER — Inpatient Hospital Stay: Payer: 59

## 2022-07-22 ENCOUNTER — Other Ambulatory Visit: Payer: Self-pay

## 2022-07-22 ENCOUNTER — Ambulatory Visit
Admission: RE | Admit: 2022-07-22 | Discharge: 2022-07-22 | Disposition: A | Discharge status: Home / Self Care | Payer: 59 | Source: Ambulatory Visit | Attending: Gastroenterology | Admitting: Gastroenterology

## 2022-07-22 ENCOUNTER — Inpatient Hospital Stay: Payer: 59

## 2022-07-22 DIAGNOSIS — K7581 Nonalcoholic steatohepatitis (NASH): Secondary | ICD-10-CM | POA: Insufficient documentation

## 2022-07-22 DIAGNOSIS — K7460 Unspecified cirrhosis of liver: Secondary | ICD-10-CM | POA: Diagnosis present

## 2022-07-22 DIAGNOSIS — R188 Other ascites: Secondary | ICD-10-CM | POA: Diagnosis not present

## 2022-07-22 MED ORDER — ALBUMIN HUMAN 25 % IV SOLN
INTRAVENOUS | Status: AC
  Administered 2022-07-22: 25 g via INTRAVENOUS
  Filled 2022-07-22: qty 100

## 2022-07-22 MED ORDER — HEPARIN SOD (PORK) LOCK FLUSH 100 UNIT/ML IV SOLN
INTRAVENOUS | Status: AC
  Filled 2022-07-22: qty 5

## 2022-07-22 MED ORDER — ALBUMIN HUMAN 25 % IV SOLN: 25.0000 g | Freq: Once | INTRAVENOUS | Status: AC

## 2022-07-22 MED ORDER — LIDOCAINE HCL (PF) 1 % IJ SOLN
10.0000 mL | Freq: Once | INTRAMUSCULAR | Status: AC
  Administered 2022-07-22: 10 mL via INTRADERMAL

## 2022-07-22 MED ORDER — ALBUMIN HUMAN 25 % IV SOLN
INTRAVENOUS | Status: AC
  Filled 2022-07-22: qty 100

## 2022-07-22 NOTE — Progress Notes (Signed)
  Care Coordination Note  07/22/2022 Name: Rachel Huber MRN: [id number] DOB: 08-13-1956  Rachel Huber is a 66 y.o. year old female who is a primary care patient of [name], MD and is actively engaged with the care management team. I reached out to Rachel Huber by phone today to assist with re-scheduling a follow up visit with the RN Case Manager  Follow up plan: Patient declines further follow up and engagement by the care management team. Appropriate care team members and provider have been notified via electronic communication.   Julian Hy, [place] Direct Dial: [phone]

## 2022-07-23 ENCOUNTER — Inpatient Hospital Stay: Payer: 59

## 2022-07-23 DIAGNOSIS — M6281 Muscle weakness (generalized): Secondary | ICD-10-CM | POA: Diagnosis present

## 2022-07-23 DIAGNOSIS — D500 Iron deficiency anemia secondary to blood loss (chronic): Secondary | ICD-10-CM | POA: Insufficient documentation

## 2022-07-23 DIAGNOSIS — K7460 Unspecified cirrhosis of liver: Secondary | ICD-10-CM | POA: Insufficient documentation

## 2022-07-23 DIAGNOSIS — R2689 Other abnormalities of gait and mobility: Secondary | ICD-10-CM | POA: Diagnosis present

## 2022-07-23 DIAGNOSIS — D563 Thalassemia minor: Secondary | ICD-10-CM | POA: Insufficient documentation

## 2022-07-23 DIAGNOSIS — R262 Difficulty in walking, not elsewhere classified: Secondary | ICD-10-CM | POA: Diagnosis present

## 2022-07-23 LAB — CBC
HCT: 20.4 % — ABNORMAL LOW (ref 36.0–46.0)
Hemoglobin: 6.4 g/dL — CL (ref 12.0–15.0)
MCH: 23.4 pg — ABNORMAL LOW (ref 26.0–34.0)
MCHC: 31.4 g/dL (ref 30.0–36.0)
MCV: 74.7 fL — ABNORMAL LOW (ref 80.0–100.0)
Platelets: 129 10*3/uL — ABNORMAL LOW (ref 150–400)
RBC: 2.73 MIL/uL — ABNORMAL LOW (ref 3.87–5.11)
RDW: 20.3 % — ABNORMAL HIGH (ref 11.5–15.5)
WBC: 2.8 10*3/uL — ABNORMAL LOW (ref 4.0–10.5)
nRBC: 0 % (ref 0.0–0.2)

## 2022-07-23 LAB — PREPARE RBC (CROSSMATCH)

## 2022-07-23 MED ORDER — SODIUM CHLORIDE 0.9% FLUSH
10.0000 mL | Freq: Once | INTRAVENOUS | Status: AC
  Administered 2022-07-23: 10 mL

## 2022-07-23 MED ORDER — HEPARIN SOD (PORK) LOCK FLUSH 100 UNIT/ML IV SOLN
500.0000 [IU] | Freq: Once | INTRAVENOUS | Status: AC
  Administered 2022-07-23: 500 [IU] via INTRAVENOUS

## 2022-07-23 NOTE — Patient Instructions (Signed)
[place]  Discharge Instructions: Thank you for choosing [facility] to provide your oncology and hematology care.  If you have a lab appointment with the [facility], please come in thru the Main Entrance and check in at the main information desk.  Wear comfortable clothing and clothing appropriate for easy access to any Portacath or PICC line.   We strive to give you quality time with your provider. You may need to reschedule your appointment if you arrive late (15 or more minutes).  Arriving late affects you and other patients whose appointments are after yours.  Also, if you miss three or more appointments without notifying the office, you may be dismissed from the clinic at the provider's discretion.      For prescription refill requests, have your pharmacy contact our office and allow 72 hours for refills to be completed.    Today you received the following chemotherapy and/or immunotherapy agents port flush lab draw.      To help prevent nausea and vomiting after your treatment, we encourage you to take your nausea medication as directed.  BELOW ARE SYMPTOMS THAT SHOULD BE REPORTED IMMEDIATELY: *FEVER GREATER THAN 100.4 F (38 C) OR HIGHER *CHILLS OR SWEATING *NAUSEA AND VOMITING THAT IS NOT CONTROLLED WITH YOUR NAUSEA MEDICATION *UNUSUAL SHORTNESS OF BREATH *UNUSUAL BRUISING OR BLEEDING *URINARY PROBLEMS (pain or burning when urinating, or frequent urination) *BOWEL PROBLEMS (unusual diarrhea, constipation, pain near the anus) TENDERNESS IN MOUTH AND THROAT WITH OR WITHOUT PRESENCE OF ULCERS (sore throat, sores in mouth, or a toothache) UNUSUAL RASH, SWELLING OR PAIN  UNUSUAL VAGINAL DISCHARGE OR ITCHING   Items with * indicate a potential emergency and should be followed up as soon as possible or go to the Emergency Department if any problems should occur.  Please show the CHEMOTHERAPY ALERT CARD or IMMUNOTHERAPY ALERT CARD at check-in to  the Emergency Department and triage nurse.  Should you have questions after your visit or need to cancel or reschedule your appointment, please contact [facility] [phone]  and follow the prompts.  Office hours are 8:00 a.m. to 4:30 p.m. Monday - Friday. Please note that voicemails left after 4:00 p.m. may not be returned until the following business day.  We are closed weekends and major holidays. You have access to a nurse at all times for urgent questions. Please call the main number to the clinic [phone] and follow the prompts.  For any non-urgent questions, you may also contact your provider using MyChart. We now offer e-Visits for anyone 68 and older to request care online for non-urgent symptoms. For details visit [email or web address].   Also download the MyChart app! Go to the app store, search "MyChart", open the app, select [facility], and log in with your MyChart username and password.  Masks are optional in the cancer centers. If you would like for your care team to wear a mask while they are taking care of you, please let them know. You may have one support person who is at least 64 years old accompany you for your appointments.

## 2022-07-23 NOTE — Progress Notes (Signed)
CRITICAL VALUE ALERT Critical value received:  hgb 6.4 Date of notification:  07-23-22 Time of notification: 1430 Critical value read back:  Yes.   Nurse who received alert:  C.Jasiya Markie RN MD notified time and response:  [id number], Reb. [place] PA-C. Will get 2 units of blood per orders tomorrow.

## 2022-07-23 NOTE — Addendum Note (Signed)
Addended by: Donnie Aho on: 07/23/2022 02:39 PM   Modules accepted: Orders

## 2022-07-23 NOTE — Progress Notes (Signed)
Rachel Huber presented for Portacath access and flush.  Portacath located right chest wall accessed with  H 20 needle.  Good blood return present. Portacath flushed with 28m NS and 500U/539mHeparin and needle removed intact.  Procedure tolerated well and without incident. No complaints at this time. Discharged from clinic by wheel chair in stable condition. Alert and oriented x 3. [facility]

## 2022-07-24 ENCOUNTER — Inpatient Hospital Stay: Payer: 59

## 2022-07-24 DIAGNOSIS — M6281 Muscle weakness (generalized): Secondary | ICD-10-CM | POA: Diagnosis not present

## 2022-07-24 DIAGNOSIS — D500 Iron deficiency anemia secondary to blood loss (chronic): Secondary | ICD-10-CM

## 2022-07-24 MED ORDER — HEPARIN SOD (PORK) LOCK FLUSH 100 UNIT/ML IV SOLN
500.0000 [IU] | Freq: Every day | INTRAVENOUS | Status: AC | PRN
  Administered 2022-07-24: 500 [IU]

## 2022-07-24 MED ORDER — DIPHENHYDRAMINE HCL 25 MG PO CAPS
25.0000 mg | ORAL_CAPSULE | Freq: Once | ORAL | Status: AC
  Administered 2022-07-24: 25 mg via ORAL
  Filled 2022-07-24: qty 1

## 2022-07-24 MED ORDER — SODIUM CHLORIDE 0.9% IV SOLUTION
250.0000 mL | Freq: Once | INTRAVENOUS | Status: AC
  Administered 2022-07-24: 250 mL via INTRAVENOUS

## 2022-07-24 MED ORDER — SODIUM CHLORIDE 0.9% FLUSH
10.0000 mL | INTRAVENOUS | Status: AC | PRN
  Administered 2022-07-24: 10 mL

## 2022-07-24 NOTE — Patient Instructions (Signed)
[facility]  Discharge Instructions: Thank you for choosing [facility] to provide your oncology and hematology care.  If you have a lab appointment with the Cancer Center, please come in thru the Main Entrance and check in at the main information desk.  Wear comfortable clothing and clothing appropriate for easy access to any Portacath or PICC line.   We strive to give you quality time with your provider. You may need to reschedule your appointment if you arrive late (15 or more minutes).  Arriving late affects you and other patients whose appointments are after yours.  Also, if you miss three or more appointments without notifying the office, you may be dismissed from the clinic at the provider's discretion.      For prescription refill requests, have your pharmacy contact our office and allow 72 hours for refills to be completed.     To help prevent nausea and vomiting after your treatment, we encourage you to take your nausea medication as directed.  BELOW ARE SYMPTOMS THAT SHOULD BE REPORTED IMMEDIATELY: *FEVER GREATER THAN 100.4 F (38 C) OR HIGHER *CHILLS OR SWEATING *NAUSEA AND VOMITING THAT IS NOT CONTROLLED WITH YOUR NAUSEA MEDICATION *UNUSUAL SHORTNESS OF BREATH *UNUSUAL BRUISING OR BLEEDING *URINARY PROBLEMS (pain or burning when urinating, or frequent urination) *BOWEL PROBLEMS (unusual diarrhea, constipation, pain near the anus) TENDERNESS IN MOUTH AND THROAT WITH OR WITHOUT PRESENCE OF ULCERS (sore throat, sores in mouth, or a toothache) UNUSUAL RASH, SWELLING OR PAIN  UNUSUAL VAGINAL DISCHARGE OR ITCHING   Items with * indicate a potential emergency and should be followed up as soon as possible or go to the Emergency Department if any problems should occur.  Please show the CHEMOTHERAPY ALERT CARD or IMMUNOTHERAPY ALERT CARD at check-in to the Emergency Department and triage nurse.  Should you have questions after your visit or need to  cancel or reschedule your appointment, please contact [facility] [phone]  and follow the prompts.  Office hours are 8:00 a.m. to 4:30 p.m. Monday - Friday. Please note that voicemails left after 4:00 p.m. may not be returned until the following business day.  We are closed weekends and major holidays. You have access to a nurse at all times for urgent questions. Please call the main number to the clinic [phone] and follow the prompts.  For any non-urgent questions, you may also contact your provider using MyChart. We now offer e-Visits for anyone 18 and older to request care online for non-urgent symptoms. For details visit [email or web address].   Also download the MyChart app! Go to the app store, search "MyChart", open the app, select [place], and log in with your MyChart username and password.  Masks are optional in the cancer centers. If you would like for your care team to wear a mask while they are taking care of you, please let them know. You may have one support person who is at least 64 years old accompany you for your appointments.  

## 2022-07-24 NOTE — Progress Notes (Signed)
Patient presents today for two units of PRBC.  Patient is in satisfactory condition with no new complaints voiced.  Vital signs are stable.  Tylenol was taken at 0715 at home prior office visit.  We will proceed with transfusion per provider orders.   Patient tolerated transfusions well with no complaints voiced.  Patient left via wheelchair in stable condition.  Vital signs stable at discharge.  Follow up as scheduled.

## 2022-07-25 LAB — TYPE AND SCREEN
ABO/RH(D): A POS
Antibody Screen: NEGATIVE
Unit division: 0
Unit division: 0

## 2022-07-25 LAB — BPAM RBC
Blood Product Expiration Date: 202312222359
Blood Product Expiration Date: 202312232359
ISSUE DATE / TIME: 202312071027
ISSUE DATE / TIME: 202312071213
Unit Type and Rh: 6200
Unit Type and Rh: 6200

## 2022-07-28 ENCOUNTER — Inpatient Hospital Stay: Payer: 59

## 2022-07-29 ENCOUNTER — Inpatient Hospital Stay: Payer: 59

## 2022-07-29 ENCOUNTER — Ambulatory Visit
Admission: RE | Admit: 2022-07-29 | Discharge: 2022-07-29 | Disposition: A | Discharge status: Home / Self Care | Payer: 59 | Source: Ambulatory Visit | Attending: Gastroenterology | Admitting: Gastroenterology

## 2022-07-29 DIAGNOSIS — K7469 Other cirrhosis of liver: Secondary | ICD-10-CM | POA: Diagnosis not present

## 2022-07-29 DIAGNOSIS — K7460 Unspecified cirrhosis of liver: Secondary | ICD-10-CM | POA: Insufficient documentation

## 2022-07-29 DIAGNOSIS — K7581 Nonalcoholic steatohepatitis (NASH): Secondary | ICD-10-CM | POA: Diagnosis not present

## 2022-07-29 DIAGNOSIS — R188 Other ascites: Secondary | ICD-10-CM | POA: Diagnosis not present

## 2022-07-29 IMAGING — US US PARACENTESIS
1 series · 8 of 8 positions shown · non-contrast
Comparison: none

INDICATION: Patient with a history of cirrhosis and recurrent large volume
ascites presents today for a therapeutic and diagnostic
paracentesis.

[Series 1: us paracentesis · 0.31mm/px · 8 of 8 slices shown]
[im 1/8]
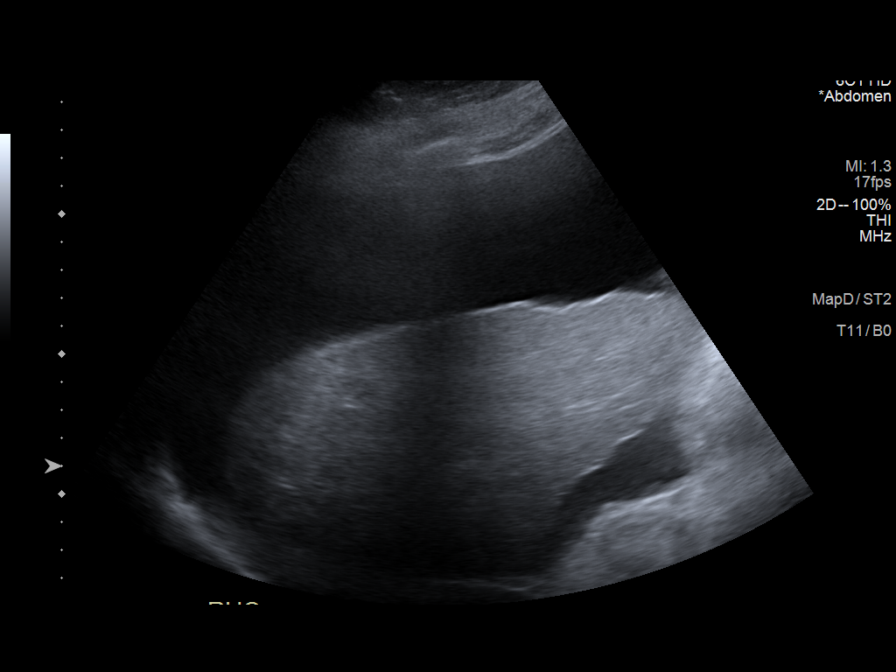
[im 2/8]
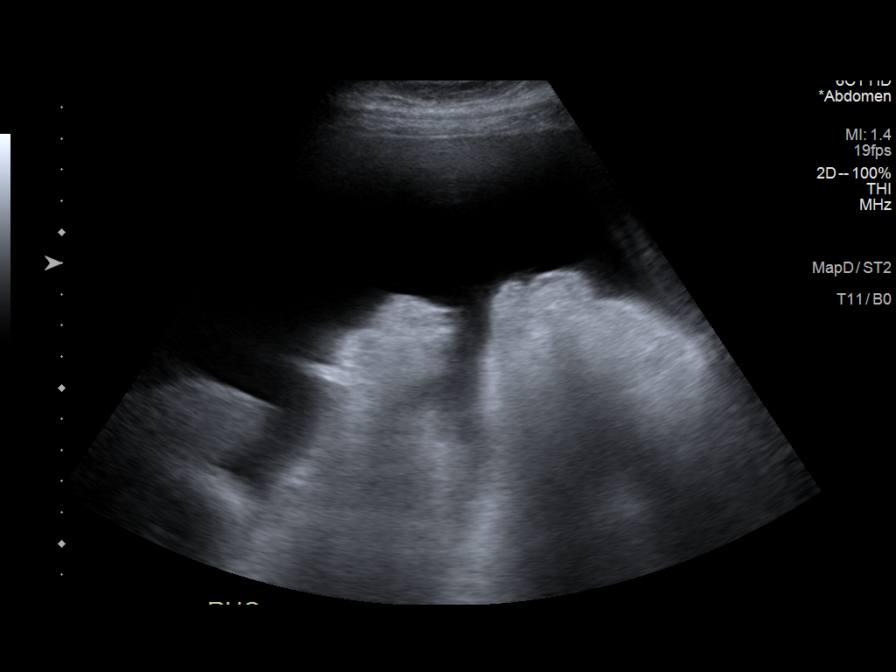
[im 3/8]
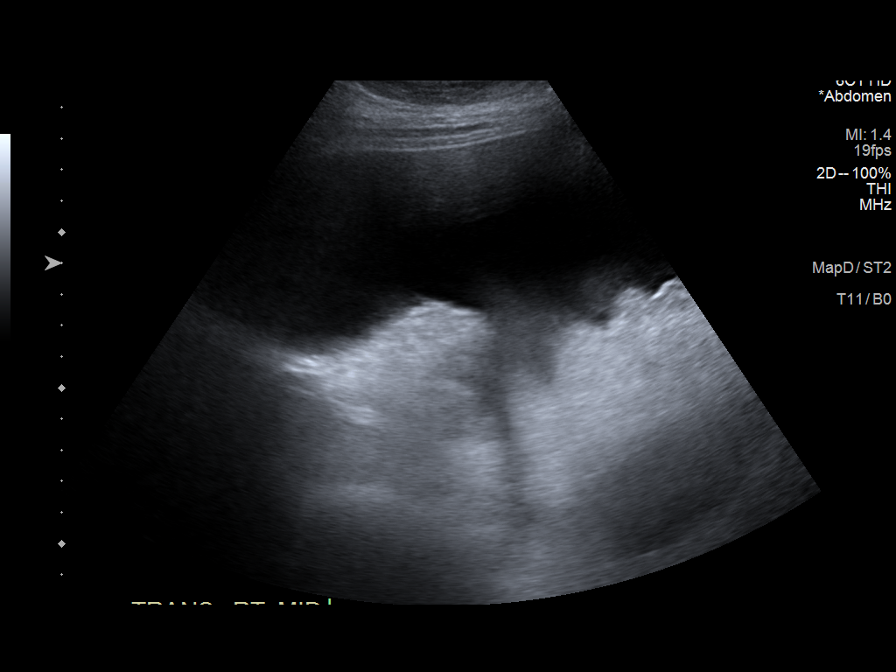
[im 4/8]
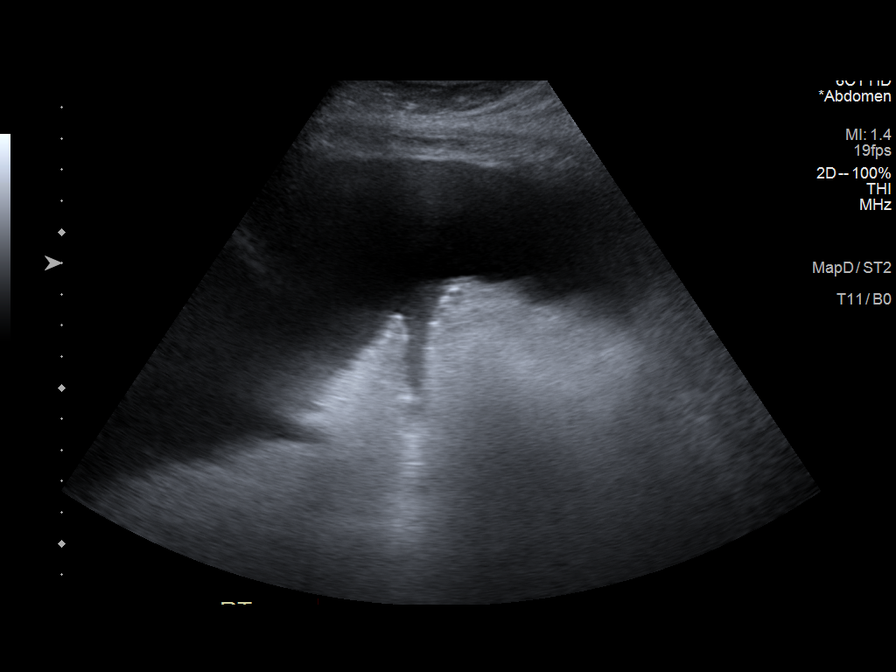
[im 5/8]
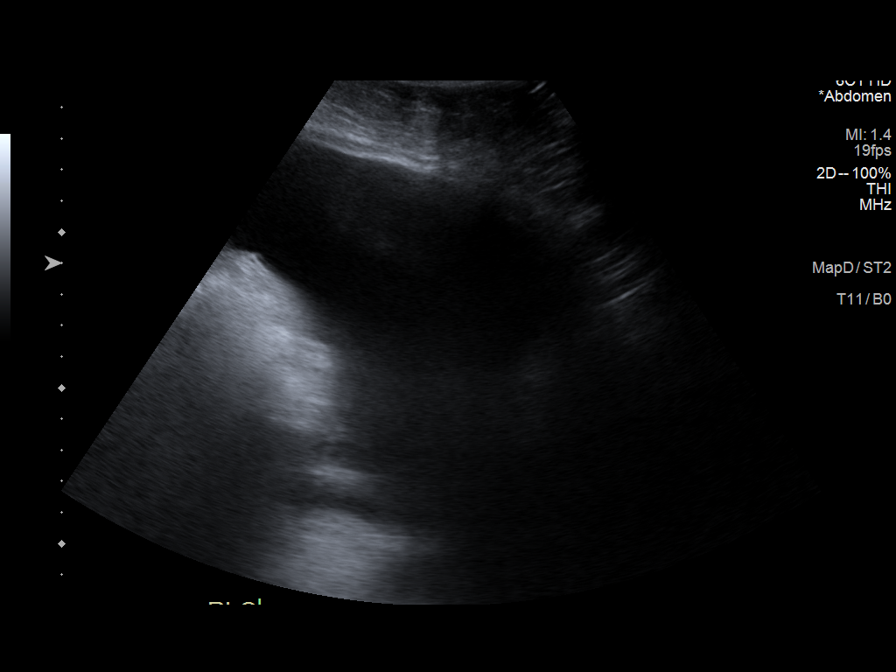
[im 6/8]
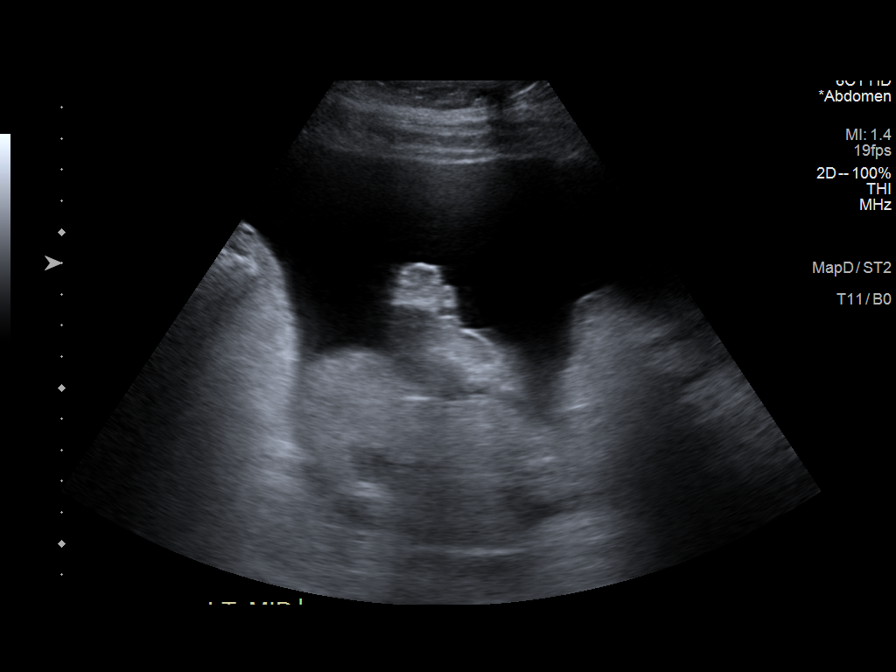
[im 7/8]
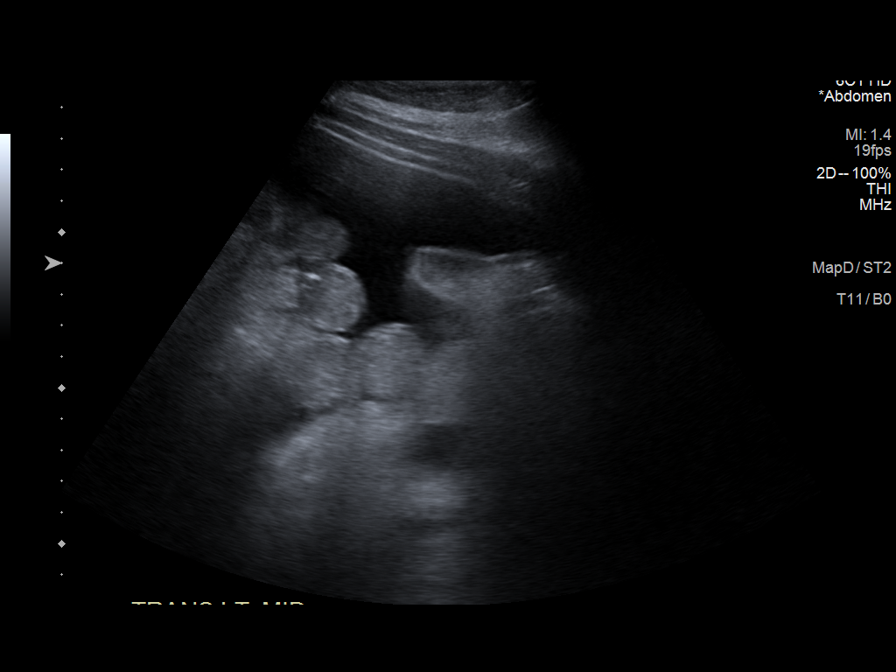
[im 8/8]
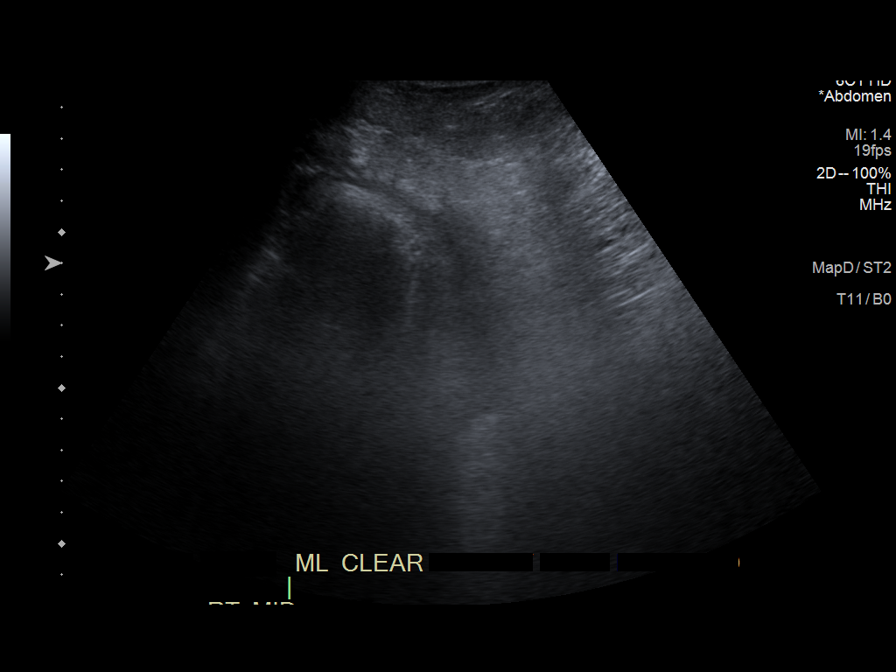

[8 of 8 positions shown; findings below may reference images not displayed]

EXAM:
ULTRASOUND GUIDED PARACENTESIS

MEDICATIONS:
1% lidocaine 10 mL

COMPLICATIONS:
None immediate.

PROCEDURE:
Informed written consent was obtained from the patient after a
discussion of the risks, benefits and alternatives to treatment. A
timeout was performed prior to the initiation of the procedure.

Initial ultrasound scanning demonstrates a large amount of ascites
within the right lower abdominal quadrant. The right lower abdomen
was prepped and draped in the usual sterile fashion. 1% lidocaine
was used for local anesthesia.

Following this, a 19 gauge, 10-cm, Yueh catheter was introduced. An
ultrasound image was saved for documentation purposes. The
paracentesis was performed. The catheter was removed and a dressing
was applied. The patient tolerated the procedure well without
immediate post procedural complication.
Patient received post-procedure intravenous albumin; see nursing
notes for details.
FINDINGS: A total of approximately 10.9 L of clear yellow fluid was removed.
Samples were sent to the laboratory as requested by the clinical
team.
IMPRESSION: Successful ultrasound-guided paracentesis yielding 10.9 liters of
peritoneal fluid. Read by: Maikel Macmillan, NP

## 2022-07-29 MED ORDER — ALBUMIN HUMAN 25 % IV SOLN
25.0000 g | Freq: Once | INTRAVENOUS | Status: AC
  Administered 2022-07-29: 25 g via INTRAVENOUS

## 2022-07-29 MED ORDER — ALBUMIN HUMAN 25 % IV SOLN
INTRAVENOUS | Status: AC
  Filled 2022-07-29: qty 100

## 2022-07-29 MED ORDER — HEPARIN SOD (PORK) LOCK FLUSH 100 UNIT/ML IV SOLN
INTRAVENOUS | Status: AC
  Filled 2022-07-29: qty 5

## 2022-07-29 MED ORDER — LIDOCAINE HCL (PF) 1 % IJ SOLN
10.0000 mL | Freq: Once | INTRAMUSCULAR | Status: AC
  Administered 2022-07-29: 10 mL via INTRADERMAL

## 2022-07-29 NOTE — Procedures (Signed)
Ultrasound-guided therapeutic paracentesis performed yielding 8 liters of straw colored fluid. No immediate complications. EBL is < 1 ml

## 2022-07-30 ENCOUNTER — Ambulatory Visit (HOSPITAL_COMMUNITY): Payer: 59 | Attending: Internal Medicine | Admitting: Physical Therapy

## 2022-07-30 ENCOUNTER — Inpatient Hospital Stay: Payer: 59

## 2022-07-30 VITALS — BP 120/43 | HR 73 | Temp 97.9°F | Resp 20

## 2022-07-30 DIAGNOSIS — D500 Iron deficiency anemia secondary to blood loss (chronic): Secondary | ICD-10-CM

## 2022-07-30 DIAGNOSIS — K7460 Unspecified cirrhosis of liver: Secondary | ICD-10-CM | POA: Insufficient documentation

## 2022-07-30 DIAGNOSIS — R2689 Other abnormalities of gait and mobility: Secondary | ICD-10-CM | POA: Insufficient documentation

## 2022-07-30 DIAGNOSIS — M6281 Muscle weakness (generalized): Secondary | ICD-10-CM | POA: Insufficient documentation

## 2022-07-30 DIAGNOSIS — Z95828 Presence of other vascular implants and grafts: Secondary | ICD-10-CM

## 2022-07-30 DIAGNOSIS — R262 Difficulty in walking, not elsewhere classified: Secondary | ICD-10-CM | POA: Insufficient documentation

## 2022-07-30 LAB — SAMPLE TO BLOOD BANK

## 2022-07-30 LAB — CBC
HCT: 20.5 % — ABNORMAL LOW (ref 36.0–46.0)
Hemoglobin: 6.4 g/dL — CL (ref 12.0–15.0)
MCH: 24 pg — ABNORMAL LOW (ref 26.0–34.0)
MCHC: 31.2 g/dL (ref 30.0–36.0)
MCV: 76.8 fL — ABNORMAL LOW (ref 80.0–100.0)
Platelets: 101 10*3/uL — ABNORMAL LOW (ref 150–400)
RBC: 2.67 MIL/uL — ABNORMAL LOW (ref 3.87–5.11)
RDW: 21.1 % — ABNORMAL HIGH (ref 11.5–15.5)
WBC: 2 10*3/uL — ABNORMAL LOW (ref 4.0–10.5)
nRBC: 0 % (ref 0.0–0.2)

## 2022-07-30 LAB — PREPARE RBC (CROSSMATCH)

## 2022-07-30 MED ORDER — SODIUM CHLORIDE 0.9% FLUSH
10.0000 mL | Freq: Once | INTRAVENOUS | Status: AC
  Administered 2022-07-30: 10 mL via INTRAVENOUS

## 2022-07-30 MED ORDER — HEPARIN SOD (PORK) LOCK FLUSH 100 UNIT/ML IV SOLN
500.0000 [IU] | Freq: Once | INTRAVENOUS | Status: AC
  Administered 2022-07-30: 500 [IU] via INTRAVENOUS

## 2022-07-30 NOTE — Progress Notes (Signed)
CRITICAL VALUE ALERT Critical value received:  HGB 6.4. Date of notification:  07-30-2022 Time of notification: 11:43 am.  Critical value read back:  Yes.   Nurse who received alert:  B.Moneka Mcquinn RN.  MD notified time and response:  R. Pennington PA @ 11:47 am by secure chat.

## 2022-07-30 NOTE — Therapy (Signed)
OUTPATIENT PHYSICAL THERAPY TREATMENT  Patient Name: Rachel Huber MRN: [id number] DOB:27-Sep-1955, 66 y.o., female Today's Date: 07/30/2022   PT End of Session - 07/30/22 1139     Visit Number 14    Number of Visits 20    Date for PT Re-Evaluation 08/15/22    Authorization Type [employer] QH (no VL)    Progress Note Due on Visit 22    PT Start Time 1138    PT Stop Time 1206    PT Time Calculation (min) 28 min    Activity Tolerance Patient tolerated treatment well;Patient limited by pain;Patient limited by fatigue    Behavior During Therapy [facility] for tasks assessed/performed                   Past Medical History:  Diagnosis Date   Anxiety    Arthritis    Cirrhosis of liver ([place])    Diabetes mellitus without complication (HCC)    Dyspnea    DOE   GERD (gastroesophageal reflux disease)    Grade IV internal hemorrhoids    Contingency plan for any future admissions for severe anemia in the setting of persistent GI bleed:  nuclear medicine tagged RBC scan to assess for location of GI bleeding - if from rectum, would then proceed to angiogram with possible repeat embolization.  Please notify IR in this case.   Hepatitis    PAST   Hypertension    Neuropathy    Neuropathy, diabetic ([place])    Pneumonia    Sinus complaint    Sleep apnea    CPAP   Thalassemia minor 1992   Past Surgical History:  Procedure Laterality Date   ABDOMINAL HYSTERECTOMY     BREAST BIOPSY Right 02/15/2018   [place] bx 6-6:30 ribbon shape, ONE CORE FRAGMENT WITH FIBROSIS. ONE CORE FRAGMENT WITH PORTION OF A DILATED   BREAST BIOPSY Right 02/15/2018   [place] bx 9:00 heart shape, USUAL DUCTAL HYPERPLASIA   BREAST LUMPECTOMY Right 03/09/2018   Procedure: BREAST LUMPECTOMY x 2;  Surgeon: Benjamine Sprague, DO;  Location: [facility] ORS;  Service: General;  Laterality: Right;   CATARACT EXTRACTION W/PHACO Left 06/11/2016   Procedure: CATARACT EXTRACTION PHACO AND INTRAOCULAR LENS PLACEMENT (IOC);  Surgeon: Estill Cotta, MD;  Location: [facility] ORS;  Service: Ophthalmology;  Laterality: Left;  Lot # [id number] H US:01:38.6 AP%:26.4 CDE:44.15   CATARACT EXTRACTION W/PHACO Right 06/15/2018   Procedure: CATARACT EXTRACTION PHACO AND INTRAOCULAR LENS PLACEMENT (IOC);  Surgeon: Birder Robson, MD;  Location: [facility] ORS;  Service: Ophthalmology;  Laterality: Right;  [place] 00:38.2 CDE 4.23 Fluid Pack Lot # [id number]   COLONOSCOPIES     COLONOSCOPY WITH PROPOFOL N/A 10/10/2020   Procedure: COLONOSCOPY WITH PROPOFOL;  Surgeon: Lesly Rubenstein, MD;  Location: [facility] ENDOSCOPY;  Service: Endoscopy;  Laterality: N/A;   COLONOSCOPY WITH PROPOFOL N/A 11/20/2020   Procedure: COLONOSCOPY WITH PROPOFOL;  Surgeon: Lesly Rubenstein, MD;  Location: [facility] ENDOSCOPY;  Service: Endoscopy;  Laterality: N/A;  DM STAT CBC, BMP COVID POSITIVE 09/02/2020   COLONOSCOPY WITH PROPOFOL N/A 06/19/2022   Procedure: COLONOSCOPY WITH PROPOFOL;  Surgeon: Lesly Rubenstein, MD;  Location: [facility] ENDOSCOPY;  Service: Endoscopy;  Laterality: N/A;   CTR     ESOPHAGOGASTRODUODENOSCOPY (EGD) WITH PROPOFOL N/A 10/10/2020   Procedure: ESOPHAGOGASTRODUODENOSCOPY (EGD) WITH PROPOFOL;  Surgeon: Lesly Rubenstein, MD;  Location: [facility] ENDOSCOPY;  Service: Endoscopy;  Laterality: N/A;  COVID POSITIVE 10/08/2020   FLEXIBLE SIGMOIDOSCOPY N/A 02/06/2022   Procedure: FLEXIBLE SIGMOIDOSCOPY;  Surgeon: Lesly Rubenstein, MD;  Location: [facility] ENDOSCOPY;  Service: Endoscopy;  Laterality: N/A;  Patient requests anesthesia   IR ANGIOGRAM SELECTIVE EACH ADDITIONAL VESSEL  02/07/2022   IR ANGIOGRAM SELECTIVE EACH ADDITIONAL VESSEL  02/07/2022   IR ANGIOGRAM VISCERAL SELECTIVE  02/07/2022   IR EMBO ARTERIAL NOT HEMORR HEMANG INC GUIDE ROADMAPPING  02/07/2022   IR IMAGING GUIDED PORT INSERTION  06/13/2022   IR PARACENTESIS  12/17/2021   IR PARACENTESIS  03/25/2022   IR RADIOLOGIST EVAL & MGMT  03/18/2022   IR RADIOLOGIST EVAL & MGMT  05/07/2022   IR RADIOLOGIST EVAL & MGMT   05/16/2022   IR US GUIDE VASC ACCESS RIGHT  02/07/2022   JOINT REPLACEMENT     KNEE SURGERY Right 09/24/2017   plates and pins   TOTAL SHOULDER ARTHROPLASTY Right 09/27/2015   Patient Active Problem List   Diagnosis Date Noted   GI bleed 06/18/2022   Rectal bleeding 06/17/2022   Obesity (BMI 30-39.9) 06/17/2022   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 mellitus ([place]) 06/17/2022   Grade IV internal hemorrhoids 05/16/2022   ABLA (acute blood loss anemia) 03/20/2022   Acute on chronic blood loss anemia 03/05/2022   Thrombocytopenia ([place]) 03/05/2022   Fever 02/08/2022   Pancytopenia ([place]) 02/07/2022   GI bleeding 02/04/2022   Chronic kidney disease, stage 3b ([place]) 02/04/2022   Depression with anxiety 02/04/2022   Liver cirrhosis secondary to NASH ([place])    Acute GI hemorrhage 01/28/2022   AKI (acute kidney injury) ([place]) 01/27/2022   Insomnia 01/27/2022   Iron deficiency anemia due to chronic blood loss 02/13/2021   History of uterine cancer 02/13/2021   Decompensated hepatic cirrhosis ([place]) 10/25/2020   OSA on CPAP 10/25/2020   Glossitis 10/25/2020   Hyponatremia 10/08/2020   Acute respiratory failure with hypoxia ([place]) 09/10/2020   Pneumonia due to COVID-19 virus 09/08/2020   Hip fracture, right ([place]) 09/04/2020   Closed right hip fracture ([place]) 09/02/2020   Type 2 diabetes mellitus with hyperlipidemia ([place]) 09/02/2020   Thalassemia minor    HLD (hyperlipidemia)    Acute kidney injury superimposed on CKD ([place])    Depression    Fall at home, initial encounter    Nondisplaced fracture of greater trochanter of right femur, initial encounter for closed fracture ([place])     PCP:  Richards MD  REFERRING PROVIDER: Tinnie Gens, NP   REFERRING DIAG: PT eval/tx for strength and conditioning   THERAPY DIAG:  Muscle weakness (generalized)  Other abnormalities of gait and mobility  Difficulty in walking, not elsewhere classified  Cirrhosis, non-alcoholic  ([place])  Rationale for Evaluation and Treatment Rehabilitation  ONSET DATE: Chronic  SUBJECTIVE:   SUBJECTIVE STATEMENT:  Patient states she was held over for her cancer treatment  today for her blood draw.  No pain or issues this morning.   PERTINENT HISTORY: Paracentesis each week (Tuesdays) with 8L of removal Falls, weakness,DM, R shoulder reverse replacement an Feb 2019 R knee pain ([place])   PAIN:  Are you having pain? Yes: NPRS scale: 0/10 Pain location: knees BLE; feel tight Pain description: aching  Aggravating factors: use, movement, WB Relieving factors: rest, meds, non WB  PRECAUTIONS: Fall  WEIGHT BEARING RESTRICTIONS No  FALLS:  Has patient fallen in last 6 months? Yes. Number of falls 1  LIVING ENVIRONMENT: Lives with: lives with their family and lives with their spouse Lives in: House/apartment Stairs: No Has following equipment at home: Single point cane and Environmental consultant -  2 wheeled  OCCUPATION: Retired/ Disability   PLOF: Needs assistance with ADLs  PATIENT GOALS  Get to point of not having to use cane/ improve balance    OBJECTIVE:   DIAGNOSTIC FINDINGS: NA   COGNITION:  Overall cognitive status: Within functional limits for tasks assessed     SENSATION: [facility]   LOWER EXTREMITY MMT:  MMT Right eval Left eval Right 06/16/22 Left 06/16/22 Right 07/16/22 Left 07/16/22  Hip flexion 4 4 4- 4 4 4   Hip extension        Hip abduction        Hip adduction        Hip internal rotation        Hip external rotation        Knee flexion 4 4      Knee extension 4 4 4+ 4+ 4+ 4+  Ankle dorsiflexion 4 4 4+ 4+ 4+ 4+  Ankle plantarflexion        Ankle inversion        Ankle eversion         (Blank rows = not tested)  FUNCTIONAL TESTS:   Evaluation: 5 times sit to stand: 28.5 second with use of hands  2 minute walk test: 170 feet  06/16/22 5 x sit to stand 56 sec using hands; a couple times needs more than one attempt to stand 5 x sit to stand 2nd  attempt; 33 sec using hands to assist up but improved form Unable to complete 2MWT    07/16/22   5X sit to stand from standard chair with 2" black foam for riser, no UE 27.33 sec   2 MWT with RW 135 feet   GAIT: Distance walked: 170 feet Assistive device utilized: Environmental consultant - 2 wheeled Level of assistance: Modified independence and SBA Comments: flexed trunk, decreased stride  TODAY'S TREATMENT: 07/30/22 Seated:  GTB rows, retractions, extensions 2X10 Standing:  GTB at ankles hip abduction 2X10  GTB at ankles hip extensions 2X10  GTB at ankles marching 2X10  07/17/22 Standing hip abduction with RTB at ankles 2X10 each  Hip extension with RTB at ankles 2X10 each  Marching high holds 2X10 RTB at ankles  Seated: sit to stands no UE 2X10 Ambulation from room to main entrance RW (150') no rest at EOS    07/16/22 Functional test measures 5X sit to stand from standard chair with 2" black foam for riser, no UE 27.33 sec 2 MWT with RW 135 feet  Ambulation with RW 270 feet Standing: hip abduction 2X10  Hip extension 2X10  Marching 2X10 Seated: Red theraband scap retraction 10X   Rows RTB 10X   Extensions RTB 10X  06/25/22 Standing: Heel raises 2 x 10 Hip abduction 2 x 10 Hip extension 2  x 10 Marching 2 x 10  LAQ's 2 x 10 2# each  06/16/22 5 x sit to stand 56 sec using hands; a couple times needs more than one attempt to stand 5 x sit to stand 2nd attempt; 33 sec using hands to assist up but improved form Progress note 2 MWT unable today Seated  Heel/toe raises x 20 LAQ's 2" hold 2# x 20 each Marching 2#  x 20`  10.23.23 O2 sats monitored Seated:  LAQ 10X3" each (84-92%)  STS from standard chair with black foam no UE 10X (84% after completing 5/rest and complete 5X more after return to 92%)  Standing:  with UE assist   Alternating marching 2X10   Hip abduction  2X10   Minisquats 10X  06/06/22 Standing: Heel raises 2 x 10 Hip abduction 2 x 10 each Sidestepping  table length x 3 down and back Mini squats x 10 Tandem stance 2 x 20" each  05/30/22  2MWT 140f with RW  STS 10x elevated height for no HHA needed  Heel raises  Toe raises  Marching  Squat  Educated on benefits of compression garments and use of butler to assist with donning. Measurements taken and given handout for ETI.  05/26/22 Nustep (seat 12) lv 2 4 min dynamic warmup  Step up 6 inch x 10 HHA x 2 Clinic ambulation 170 feet with RW  Sit to stand x10   05/23/22            Nustep hills 3 x 6' level 3            Step up 6" x 10 B            Marching x 15             Minisquat x 10             Side step at // with green theraband  x 3 RT            Sit to stand x 10 with green theraband      05/19/22                        Standing:  Heel raises  20X                         Toeraises 20X Marching 10X 2 sets 1 HHA with intermittent 2HHA                        Mini squat 10X 2 sets                         Side step at // x 2 RT    Sit to stand x10 no UE's from standard chair, no UE    Ambulation around clinic with RW for activity tolerance X150 feet without rest                     05/16/22                        Standing:                        Heel raises x 10                        Marching x 10                         Mini squat x 10                         Side step at // x 2 RT                         Sit to stand x10   PATIENT EDUCATION:  Education details: on eval findings, POC and HEP  Person educated: Patient Education method: Explanation Education comprehension: verbalized understanding   HOME EXERCISE PROGRAM:  07/16/22  Seated theraband exercises RTB (rows, retraction, extension)  05/30/22:  Begin walking program with RW  04/19/22 - Heel Raises with Counter Support  - 2 x daily - 7 x weekly - 1 sets - 10 reps - 3-5" hold - Mini Squat with Counter Support  - 2 x daily - 7 x weekly - 1 sets - 10 reps - 3-5" hold - Sit to Stand with Counter  Support  - 2 x daily - 7 x weekly - 1 sets - 10 reps9/              Access Code: [email or web address] URL: [email or web address] Date: 05/01/2022 Prepared by: Josue Hector  Exercises - Seated Heel Toe Raises  - 2 x daily - 7 x weekly - 1 sets - 10 reps - Seated Long Arc Quad  - 2 x daily - 7 x weekly - 1 sets - 10 reps - Seated March  - 2 x daily - 7 x weekly - 1 sets - 10 reps - Supine Active Straight Leg Raise  - 7 x weekly - 1 sets - 10 reps  ASSESSMENT:  CLINICAL IMPRESSION: Pt arrived late as she was held over by the cancer center.  Began with seated UE strengthening using theraband and also completed standing strengthening.  Increased to green theraband this session and completed 2 sets.  Pt with increased need for breaks and noted fatigue.  HHA from therapist to come to standing position.  Pt received call during end of session from cancer center with results from blood work indicating low Hgb at 6.4g/dL and need to get 2 units of RBC tomorrow morning.  Explained to pt that she is not in therapeutic range for therapy and accounts for her fatigue today.  Pt reports her Hgb is usually around 7-8 g/dL.  Ended session at this point.  LE's were measured for compression garments and given [employer] therapy brochure.  Pt educated on why her skin is so dry on her legs.  Treatment limited by late arrival and low hgb.  Patient will continue to benefit from continued skilled therapy services  to address deficits and promote return to optimal function.    OBJECTIVE IMPAIRMENTS Abnormal gait, decreased activity tolerance, decreased balance, decreased mobility, difficulty walking, decreased ROM, decreased strength, improper body mechanics, and pain.   ACTIVITY LIMITATIONS lifting, bending, standing, squatting, stairs, transfers, and locomotion level  PARTICIPATION LIMITATIONS: meal prep, cleaning, laundry, shopping, community activity, and yard work  PERSONAL FACTORS Past/current experiences and  Time since onset of injury/illness/exacerbation are also affecting patient's functional outcome.   REHAB POTENTIAL: Good  CLINICAL DECISION MAKING: Stable/uncomplicated  EVALUATION COMPLEXITY: Low   GOALS: SHORT TERM GOALS: Target date: 05/29/2022  Patient will be independent with initial HEP and self-management strategies to improve functional outcomes Baseline:  Goal status: IN PROGRESS  2. Patient will be able to stand, with minimal support, at least 5 minutes for improved ability to perform meal preparation/ cooking/ grooming/ cleaning ADLs.  Baseline: 2.5 minutes Goal status: IN PROGRESS  LONG TERM GOALS: Target date: 07/14/2022  Patient will be independent with advanced HEP and self-management strategies to improve functional outcomes Baseline:  Goal status: IN PROGRESS  2.  Patient will report at least 65% overall improvement in subjective complaint to indicate improvement in ability to perform ADLs. Baseline:  Goal status: IN PROGRESS  3.  Patient will be able to stand, with minimal support, at least 10 minutes for improved ability to perform meal preparation/ cooking/ grooming/  cleaning ADLs.  Baseline: 2.5 minutes Goal status: IN PROGRESS  4. Patient will have equal to or > 4+/5 MMT throughout BLE to improve ability to perform functional mobility, stair ambulation and ADLs.  Baseline: See MMT Goal status: IN PROGRESS  5. Patient will be able to ambulate at least 300 feet during 2MWT with LRAD to demonstrate improved ability to perform functional mobility and associated tasks. Baseline: 170 feet Goal status: IN PROGRESS  PLAN: PT FREQUENCY: 2x/week  PT DURATION: 4 more weeks  PLANNED INTERVENTIONS: Therapeutic exercises, Therapeutic activity, Neuromuscular re-education, Balance training, Gait training, Patient/Family education, Joint manipulation, Joint mobilization, Stair training, Aquatic Therapy, Dry Needling, Electrical stimulation, Spinal manipulation,  Spinal mobilization, Cryotherapy, Moist heat, scar mobilization, Taping, Traction, Ultrasound, Biofeedback, Ionotophoresis 40m/ml Dexamethasone, and Manual therapy.  PLAN FOR NEXT SESSION: continue to progress functional LE strength, work on ambulation tolerance and balance.  Increase balance challenges; add vectors next session.    ATeena Irani PTA/CLT CBromleyPh: [phone] 1:02 PM, 07/30/22

## 2022-07-30 NOTE — Progress Notes (Signed)
CRITICAL VALUE ALERT Critical value received:  HGB 6.4.  Date of notification:  07-30-2022 Time of notification: 11:43 am.  Critical value read back:  Yes.   Nurse who received alert:  B.Isidore Margraf RN.  MD notified time and response:  [place] PA @ 11:47 am. Orders received to infuse 2 units of blood tomorrow.

## 2022-07-30 NOTE — Addendum Note (Signed)
Addended by: Benjiman Core D on: 07/30/2022 12:22 PM   Modules accepted: Orders

## 2022-07-30 NOTE — Progress Notes (Signed)
Port flushed with good blood return noted. No bruising or swelling at site. Bandaid applied and patient discharged in satisfactory condition. VVS stable with no signs or symptoms of distressed noted.

## 2022-07-31 ENCOUNTER — Inpatient Hospital Stay: Payer: 59

## 2022-07-31 DIAGNOSIS — D500 Iron deficiency anemia secondary to blood loss (chronic): Secondary | ICD-10-CM

## 2022-07-31 DIAGNOSIS — M6281 Muscle weakness (generalized): Secondary | ICD-10-CM | POA: Diagnosis not present

## 2022-07-31 MED ORDER — HEPARIN SOD (PORK) LOCK FLUSH 100 UNIT/ML IV SOLN
500.0000 [IU] | Freq: Every day | INTRAVENOUS | Status: AC | PRN
  Administered 2022-07-31: 500 [IU]

## 2022-07-31 MED ORDER — SODIUM CHLORIDE 0.9% IV SOLUTION
250.0000 mL | Freq: Once | INTRAVENOUS | Status: AC
  Administered 2022-07-31: 250 mL via INTRAVENOUS

## 2022-07-31 MED ORDER — SODIUM CHLORIDE 0.9% FLUSH
10.0000 mL | INTRAVENOUS | Status: AC | PRN
  Administered 2022-07-31: 10 mL

## 2022-07-31 NOTE — Patient Instructions (Signed)
[place]  Discharge Instructions: Thank you for choosing [facility] to provide your oncology and hematology care.  If you have a lab appointment with the [facility], please come in thru the Main Entrance and check in at the main information desk.  Wear comfortable clothing and clothing appropriate for easy access to any Portacath or PICC line.   We strive to give you quality time with your provider. You may need to reschedule your appointment if you arrive late (15 or more minutes).  Arriving late affects you and other patients whose appointments are after yours.  Also, if you miss three or more appointments without notifying the office, you may be dismissed from the clinic at the provider's discretion.      For prescription refill requests, have your pharmacy contact our office and allow 72 hours for refills to be completed.    Today you received the following chemotherapy and/or immunotherapy agents 2 units of blood.  Blood Transfusion, Adult, Care After The following information offers guidance on how to care for yourself after your procedure. Your health care provider may also give you more specific instructions. If you have problems or questions, contact your health care provider. What can I expect after the procedure? After the procedure, it is common to have: Bruising and soreness where the IV was inserted. A headache. Follow these instructions at home: IV insertion site care     Follow instructions from your health care provider about how to take care of your IV insertion site. Make sure you: Wash your hands with soap and water for at least 20 seconds before and after you change your bandage (dressing). If soap and water are not available, use hand sanitizer. Change your dressing as told by your health care provider. Check your IV insertion site every day for signs of infection. Check for: Redness, swelling, or pain. Bleeding from the  site. Warmth. Pus or a bad smell. General instructions Take over-the-counter and prescription medicines only as told by your health care provider. Rest as told by your health care provider. Return to your normal activities as told by your health care provider. Keep all follow-up visits. Lab tests may need to be done at certain periods to recheck your blood counts. Contact a health care provider if: You have itching or red, swollen areas of skin (hives). You have a fever or chills. You have pain in the head, back, or chest. You feel anxious or you feel weak after doing your normal activities. You have redness, swelling, warmth, or pain around the IV insertion site. You have blood coming from the IV insertion site that does not stop with pressure. You have pus or a bad smell coming from your IV insertion site. If you received your blood transfusion in an outpatient setting, you will be told whom to contact to report any reactions. Get help right away if: You have symptoms of a serious allergic or immune system reaction, including: Trouble breathing or shortness of breath. Swelling of the face, feeling flushed, or widespread rash. Dark urine or blood in the urine. Fast heartbeat. These symptoms may be an emergency. Get help right away. Call 911. Do not wait to see if the symptoms will go away. Do not drive yourself to the hospital. Summary Bruising and soreness around the IV insertion site are common. Check your IV insertion site every day for signs of infection. Rest as told by your health care provider. Return to your normal  activities as told by your health care provider. Get help right away for symptoms of a serious allergic or immune system reaction to the blood transfusion. This information is not intended to replace advice given to you by your health care provider. Make sure you discuss any questions you have with your health care provider. Document Revised: 11/01/2021 Document  Reviewed: 11/01/2021 Elsevier Patient Education  [employer].       To help prevent nausea and vomiting after your treatment, we encourage you to take your nausea medication as directed.  BELOW ARE SYMPTOMS THAT SHOULD BE REPORTED IMMEDIATELY: *FEVER GREATER THAN 100.4 F (38 C) OR HIGHER *CHILLS OR SWEATING *NAUSEA AND VOMITING THAT IS NOT CONTROLLED WITH YOUR NAUSEA MEDICATION *UNUSUAL SHORTNESS OF BREATH *UNUSUAL BRUISING OR BLEEDING *URINARY PROBLEMS (pain or burning when urinating, or frequent urination) *BOWEL PROBLEMS (unusual diarrhea, constipation, pain near the anus) TENDERNESS IN MOUTH AND THROAT WITH OR WITHOUT PRESENCE OF ULCERS (sore throat, sores in mouth, or a toothache) UNUSUAL RASH, SWELLING OR PAIN  UNUSUAL VAGINAL DISCHARGE OR ITCHING   Items with * indicate a potential emergency and should be followed up as soon as possible or go to the Emergency Department if any problems should occur.  Please show the CHEMOTHERAPY ALERT CARD or IMMUNOTHERAPY ALERT CARD at check-in to the Emergency Department and triage nurse.  Should you have questions after your visit or need to cancel or reschedule your appointment, please contact [facility] [phone]  and follow the prompts.  Office hours are 8:00 a.m. to 4:30 p.m. Monday - Friday. Please note that voicemails left after 4:00 p.m. may not be returned until the following business day.  We are closed weekends and major holidays. You have access to a nurse at all times for urgent questions. Please call the main number to the clinic [phone] and follow the prompts.  For any non-urgent questions, you may also contact your provider using MyChart. We now offer e-Visits for anyone 11 and older to request care online for non-urgent symptoms. For details visit [email or web address].   Also download the MyChart app! Go to the app store, search "MyChart", open the app, select [facility], and log in with  your MyChart username and password.  Masks are optional in the cancer centers. If you would like for your care team to wear a mask while they are taking care of you, please let them know. You may have one support person who is at least 64 years old accompany you for your appointments.

## 2022-07-31 NOTE — Progress Notes (Signed)
Patient presents today for 2 units of PRBC's. Vital signs stable. Patient denies any rectal bleeding today. Patient asymptomatic and denies fatigue, dizziness, or active rectal bleeding. Patient states Monday 07/28/2022 in the evening two episodes of bleeding with bowel movements but none since Monday. Patient has no complaints today.   2 units of blood given today per MD orders. Tolerated infusion without adverse affects. Vital signs stable. No complaints at this time. Discharged from clinic ambulatory in stable condition. Alert and oriented x 3. F/U with [facility] as scheduled.

## 2022-08-01 LAB — BPAM RBC
Blood Product Expiration Date: 202401062359
Blood Product Expiration Date: 202401062359
ISSUE DATE / TIME: 202312141101
ISSUE DATE / TIME: 202312141231
Unit Type and Rh: 6200
Unit Type and Rh: 6200

## 2022-08-01 LAB — TYPE AND SCREEN
ABO/RH(D): A POS
Antibody Screen: NEGATIVE
Unit division: 0
Unit division: 0

## 2022-08-04 ENCOUNTER — Inpatient Hospital Stay: Payer: 59

## 2022-08-04 ENCOUNTER — Other Ambulatory Visit: Payer: Self-pay

## 2022-08-04 ENCOUNTER — Ambulatory Visit
Admission: RE | Admit: 2022-08-04 | Discharge: 2022-08-04 | Disposition: A | Discharge status: Home / Self Care | Payer: 59 | Source: Ambulatory Visit | Attending: Gastroenterology | Admitting: Gastroenterology

## 2022-08-04 DIAGNOSIS — K7581 Nonalcoholic steatohepatitis (NASH): Secondary | ICD-10-CM | POA: Insufficient documentation

## 2022-08-04 DIAGNOSIS — D500 Iron deficiency anemia secondary to blood loss (chronic): Secondary | ICD-10-CM

## 2022-08-04 DIAGNOSIS — K7460 Unspecified cirrhosis of liver: Secondary | ICD-10-CM | POA: Diagnosis not present

## 2022-08-04 DIAGNOSIS — K7469 Other cirrhosis of liver: Secondary | ICD-10-CM | POA: Diagnosis not present

## 2022-08-04 DIAGNOSIS — R188 Other ascites: Secondary | ICD-10-CM | POA: Diagnosis not present

## 2022-08-04 MED ORDER — HEPARIN SOD (PORK) LOCK FLUSH 100 UNIT/ML IV SOLN
INTRAVENOUS | Status: AC
  Filled 2022-08-04: qty 5

## 2022-08-04 MED ORDER — ALBUMIN HUMAN 25 % IV SOLN
INTRAVENOUS | Status: AC
  Administered 2022-08-04: 12.5 g via INTRAVENOUS
  Filled 2022-08-04: qty 100

## 2022-08-04 MED ORDER — ALBUMIN HUMAN 25 % IV SOLN: 25.0000 g | Freq: Once | INTRAVENOUS | Status: AC

## 2022-08-04 MED ORDER — ALBUMIN HUMAN 25 % IV SOLN
INTRAVENOUS | Status: AC
  Administered 2022-08-04: 25 g via INTRAVENOUS
  Filled 2022-08-04: qty 100

## 2022-08-04 MED ORDER — ALBUMIN HUMAN 25 % IV SOLN: 12.5000 g | Freq: Once | INTRAVENOUS | Status: AC

## 2022-08-04 MED ORDER — LIDOCAINE HCL (PF) 1 % IJ SOLN
10.0000 mL | Freq: Once | INTRAMUSCULAR | Status: AC
  Administered 2022-08-04: 10 mL via INTRADERMAL

## 2022-08-04 NOTE — Procedures (Signed)
PROCEDURE SUMMARY:  Successful image-guided paracentesis from the right lower abdomen.  Yielded 6.4 liters of yellow fluid.  No immediate complications.  EBL = trace. Patient tolerated well.   Specimen was not sent for labs.  Please see imaging section of Epic for full dictation.   Lura Em PA-C 08/04/2022 2:46 PM

## 2022-08-05 ENCOUNTER — Inpatient Hospital Stay: Payer: 59

## 2022-08-05 DIAGNOSIS — Z23 Encounter for immunization: Secondary | ICD-10-CM | POA: Diagnosis not present

## 2022-08-05 DIAGNOSIS — Z7682 Awaiting organ transplant status: Secondary | ICD-10-CM | POA: Diagnosis not present

## 2022-08-05 DIAGNOSIS — Z01818 Encounter for other preprocedural examination: Secondary | ICD-10-CM | POA: Diagnosis not present

## 2022-08-05 DIAGNOSIS — Z95828 Presence of other vascular implants and grafts: Secondary | ICD-10-CM | POA: Diagnosis not present

## 2022-08-06 ENCOUNTER — Inpatient Hospital Stay: Payer: 59

## 2022-08-06 ENCOUNTER — Ambulatory Visit (HOSPITAL_COMMUNITY): Payer: 59 | Admitting: Physical Therapy

## 2022-08-06 DIAGNOSIS — G4733 Obstructive sleep apnea (adult) (pediatric): Secondary | ICD-10-CM | POA: Diagnosis not present

## 2022-08-06 DIAGNOSIS — R69 Illness, unspecified: Secondary | ICD-10-CM | POA: Diagnosis not present

## 2022-08-06 DIAGNOSIS — Z9181 History of falling: Secondary | ICD-10-CM | POA: Diagnosis not present

## 2022-08-06 DIAGNOSIS — E1140 Type 2 diabetes mellitus with diabetic neuropathy, unspecified: Secondary | ICD-10-CM | POA: Diagnosis not present

## 2022-08-06 DIAGNOSIS — D500 Iron deficiency anemia secondary to blood loss (chronic): Secondary | ICD-10-CM

## 2022-08-06 DIAGNOSIS — M6281 Muscle weakness (generalized): Secondary | ICD-10-CM | POA: Diagnosis not present

## 2022-08-06 DIAGNOSIS — Z8542 Personal history of malignant neoplasm of other parts of uterus: Secondary | ICD-10-CM | POA: Diagnosis not present

## 2022-08-06 DIAGNOSIS — J45909 Unspecified asthma, uncomplicated: Secondary | ICD-10-CM | POA: Diagnosis not present

## 2022-08-06 DIAGNOSIS — E785 Hyperlipidemia, unspecified: Secondary | ICD-10-CM | POA: Diagnosis not present

## 2022-08-06 DIAGNOSIS — Z833 Family history of diabetes mellitus: Secondary | ICD-10-CM | POA: Diagnosis not present

## 2022-08-06 DIAGNOSIS — R262 Difficulty in walking, not elsewhere classified: Secondary | ICD-10-CM

## 2022-08-06 DIAGNOSIS — M1990 Unspecified osteoarthritis, unspecified site: Secondary | ICD-10-CM | POA: Diagnosis not present

## 2022-08-06 DIAGNOSIS — R2689 Other abnormalities of gait and mobility: Secondary | ICD-10-CM

## 2022-08-06 DIAGNOSIS — K219 Gastro-esophageal reflux disease without esophagitis: Secondary | ICD-10-CM | POA: Diagnosis not present

## 2022-08-06 DIAGNOSIS — E559 Vitamin D deficiency, unspecified: Secondary | ICD-10-CM | POA: Diagnosis not present

## 2022-08-06 DIAGNOSIS — Z8249 Family history of ischemic heart disease and other diseases of the circulatory system: Secondary | ICD-10-CM | POA: Diagnosis not present

## 2022-08-06 LAB — CBC
HCT: 22 % — ABNORMAL LOW (ref 36.0–46.0)
Hemoglobin: 7 g/dL — ABNORMAL LOW (ref 12.0–15.0)
MCH: 24.8 pg — ABNORMAL LOW (ref 26.0–34.0)
MCHC: 31.8 g/dL (ref 30.0–36.0)
MCV: 78 fL — ABNORMAL LOW (ref 80.0–100.0)
Platelets: 107 10*3/uL — ABNORMAL LOW (ref 150–400)
RBC: 2.82 MIL/uL — ABNORMAL LOW (ref 3.87–5.11)
RDW: 21.5 % — ABNORMAL HIGH (ref 11.5–15.5)
WBC: 2.2 10*3/uL — ABNORMAL LOW (ref 4.0–10.5)
nRBC: 0 % (ref 0.0–0.2)

## 2022-08-06 LAB — PREPARE RBC (CROSSMATCH)

## 2022-08-06 MED ORDER — SODIUM CHLORIDE 0.9% FLUSH
10.0000 mL | Freq: Once | INTRAVENOUS | Status: AC
  Administered 2022-08-06: 10 mL via INTRAVENOUS

## 2022-08-06 MED ORDER — HEPARIN SOD (PORK) LOCK FLUSH 100 UNIT/ML IV SOLN
500.0000 [IU] | Freq: Once | INTRAVENOUS | Status: AC
  Administered 2022-08-06: 500 [IU] via INTRAVENOUS

## 2022-08-06 NOTE — Patient Instructions (Signed)
[place]  Discharge Instructions: Thank you for choosing [facility] to provide your oncology and hematology care.  If you have a lab appointment with the [facility], please come in thru the Main Entrance and check in at the main information desk.  Wear comfortable clothing and clothing appropriate for easy access to any Portacath or PICC line.   We strive to give you quality time with your provider. You may need to reschedule your appointment if you arrive late (15 or more minutes).  Arriving late affects you and other patients whose appointments are after yours.  Also, if you miss three or more appointments without notifying the office, you may be dismissed from the clinic at the provider's discretion.      For prescription refill requests, have your pharmacy contact our office and allow 72 hours for refills to be completed.    Today you received the following chemotherapy and/or immunotherapy agents Port flush      To help prevent nausea and vomiting after your treatment, we encourage you to take your nausea medication as directed.  BELOW ARE SYMPTOMS THAT SHOULD BE REPORTED IMMEDIATELY: *FEVER GREATER THAN 100.4 F (38 C) OR HIGHER *CHILLS OR SWEATING *NAUSEA AND VOMITING THAT IS NOT CONTROLLED WITH YOUR NAUSEA MEDICATION *UNUSUAL SHORTNESS OF BREATH *UNUSUAL BRUISING OR BLEEDING *URINARY PROBLEMS (pain or burning when urinating, or frequent urination) *BOWEL PROBLEMS (unusual diarrhea, constipation, pain near the anus) TENDERNESS IN MOUTH AND THROAT WITH OR WITHOUT PRESENCE OF ULCERS (sore throat, sores in mouth, or a toothache) UNUSUAL RASH, SWELLING OR PAIN  UNUSUAL VAGINAL DISCHARGE OR ITCHING   Items with * indicate a potential emergency and should be followed up as soon as possible or go to the Emergency Department if any problems should occur.  Please show the CHEMOTHERAPY ALERT CARD or IMMUNOTHERAPY ALERT CARD at check-in to the  Emergency Department and triage nurse.  Should you have questions after your visit or need to cancel or reschedule your appointment, please contact [facility] [phone]  and follow the prompts.  Office hours are 8:00 a.m. to 4:30 p.m. Monday - Friday. Please note that voicemails left after 4:00 p.m. may not be returned until the following business day.  We are closed weekends and major holidays. You have access to a nurse at all times for urgent questions. Please call the main number to the clinic [phone] and follow the prompts.  For any non-urgent questions, you may also contact your provider using MyChart. We now offer e-Visits for anyone 28 and older to request care online for non-urgent symptoms. For details visit [email or web address].   Also download the MyChart app! Go to the app store, search "MyChart", open the app, select [facility], and log in with your MyChart username and password.  Masks are optional in the cancer centers. If you would like for your care team to wear a mask while they are taking care of you, please let them know. You may have one support person who is at least 64 years old accompany you for your appointments.

## 2022-08-06 NOTE — Therapy (Signed)
OUTPATIENT PHYSICAL THERAPY TREATMENT  Patient Name: Rachel Huber MRN: [id number] DOB:1956/06/03, 66 y.o., female Today's Date: 08/06/2022   PT End of Session - 08/06/22 1607     Visit Number 15    Number of Visits 20    Date for PT Re-Evaluation 08/15/22    Authorization Type [employer] QH (no VL)    Progress Note Due on Visit 22    PT Start Time 1518    PT Stop Time 1605    PT Time Calculation (min) 47 min    Activity Tolerance Patient tolerated treatment well;Patient limited by pain;Patient limited by fatigue    Behavior During Therapy [facility] for tasks assessed/performed                   Past Medical History:  Diagnosis Date   Anxiety    Arthritis    Cirrhosis of liver ([place])    Diabetes mellitus without complication (HCC)    Dyspnea    DOE   GERD (gastroesophageal reflux disease)    Grade IV internal hemorrhoids    Contingency plan for any future admissions for severe anemia in the setting of persistent GI bleed:  nuclear medicine tagged RBC scan to assess for location of GI bleeding - if from rectum, would then proceed to angiogram with possible repeat embolization.  Please notify IR in this case.   Hepatitis    PAST   Hypertension    Neuropathy    Neuropathy, diabetic ([place])    Pneumonia    Sinus complaint    Sleep apnea    CPAP   Thalassemia minor 1992   Past Surgical History:  Procedure Laterality Date   ABDOMINAL HYSTERECTOMY     BREAST BIOPSY Right 02/15/2018   [place] bx 6-6:30 ribbon shape, ONE CORE FRAGMENT WITH FIBROSIS. ONE CORE FRAGMENT WITH PORTION OF A DILATED   BREAST BIOPSY Right 02/15/2018   [place] bx 9:00 heart shape, USUAL DUCTAL HYPERPLASIA   BREAST LUMPECTOMY Right 03/09/2018   Procedure: BREAST LUMPECTOMY x 2;  Surgeon: Benjamine Sprague, DO;  Location: [facility] ORS;  Service: General;  Laterality: Right;   CATARACT EXTRACTION W/PHACO Left 06/11/2016   Procedure: CATARACT EXTRACTION PHACO AND INTRAOCULAR LENS PLACEMENT (IOC);  Surgeon: Estill Cotta, MD;  Location: [facility] ORS;  Service: Ophthalmology;  Laterality: Left;  Lot # [id number] H US:01:38.6 AP%:26.4 CDE:44.15   CATARACT EXTRACTION W/PHACO Right 06/15/2018   Procedure: CATARACT EXTRACTION PHACO AND INTRAOCULAR LENS PLACEMENT (IOC);  Surgeon: Birder Robson, MD;  Location: [facility] ORS;  Service: Ophthalmology;  Laterality: Right;  [place] 00:38.2 CDE 4.23 Fluid Pack Lot # [id number]   COLONOSCOPIES     COLONOSCOPY WITH PROPOFOL N/A 10/10/2020   Procedure: COLONOSCOPY WITH PROPOFOL;  Surgeon: Lesly Rubenstein, MD;  Location: [facility] ENDOSCOPY;  Service: Endoscopy;  Laterality: N/A;   COLONOSCOPY WITH PROPOFOL N/A 11/20/2020   Procedure: COLONOSCOPY WITH PROPOFOL;  Surgeon: Lesly Rubenstein, MD;  Location: [facility] ENDOSCOPY;  Service: Endoscopy;  Laterality: N/A;  DM STAT CBC, BMP COVID POSITIVE 09/02/2020   COLONOSCOPY WITH PROPOFOL N/A 06/19/2022   Procedure: COLONOSCOPY WITH PROPOFOL;  Surgeon: Lesly Rubenstein, MD;  Location: [facility] ENDOSCOPY;  Service: Endoscopy;  Laterality: N/A;   CTR     ESOPHAGOGASTRODUODENOSCOPY (EGD) WITH PROPOFOL N/A 10/10/2020   Procedure: ESOPHAGOGASTRODUODENOSCOPY (EGD) WITH PROPOFOL;  Surgeon: Lesly Rubenstein, MD;  Location: [facility] ENDOSCOPY;  Service: Endoscopy;  Laterality: N/A;  COVID POSITIVE 10/08/2020   FLEXIBLE SIGMOIDOSCOPY N/A 02/06/2022   Procedure: FLEXIBLE SIGMOIDOSCOPY;  Surgeon: Lesly Rubenstein, MD;  Location: [facility] ENDOSCOPY;  Service: Endoscopy;  Laterality: N/A;  Patient requests anesthesia   IR ANGIOGRAM SELECTIVE EACH ADDITIONAL VESSEL  02/07/2022   IR ANGIOGRAM SELECTIVE EACH ADDITIONAL VESSEL  02/07/2022   IR ANGIOGRAM VISCERAL SELECTIVE  02/07/2022   IR EMBO ARTERIAL NOT HEMORR HEMANG INC GUIDE ROADMAPPING  02/07/2022   IR IMAGING GUIDED PORT INSERTION  06/13/2022   IR PARACENTESIS  12/17/2021   IR PARACENTESIS  03/25/2022   IR RADIOLOGIST EVAL & MGMT  03/18/2022   IR RADIOLOGIST EVAL & MGMT  05/07/2022   IR RADIOLOGIST EVAL & MGMT   05/16/2022   IR US GUIDE VASC ACCESS RIGHT  02/07/2022   JOINT REPLACEMENT     KNEE SURGERY Right 09/24/2017   plates and pins   TOTAL SHOULDER ARTHROPLASTY Right 09/27/2015   Patient Active Problem List   Diagnosis Date Noted   GI bleed 06/18/2022   Rectal bleeding 06/17/2022   Obesity (BMI 30-39.9) 06/17/2022   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 mellitus ([place]) 06/17/2022   Grade IV internal hemorrhoids 05/16/2022   ABLA (acute blood loss anemia) 03/20/2022   Acute on chronic blood loss anemia 03/05/2022   Thrombocytopenia ([place]) 03/05/2022   Fever 02/08/2022   Pancytopenia ([place]) 02/07/2022   GI bleeding 02/04/2022   Chronic kidney disease, stage 3b ([place]) 02/04/2022   Depression with anxiety 02/04/2022   Liver cirrhosis secondary to NASH ([place])    Acute GI hemorrhage 01/28/2022   AKI (acute kidney injury) ([place]) 01/27/2022   Insomnia 01/27/2022   Iron deficiency anemia due to chronic blood loss 02/13/2021   History of uterine cancer 02/13/2021   Decompensated hepatic cirrhosis ([place]) 10/25/2020   OSA on CPAP 10/25/2020   Glossitis 10/25/2020   Hyponatremia 10/08/2020   Acute respiratory failure with hypoxia ([place]) 09/10/2020   Pneumonia due to COVID-19 virus 09/08/2020   Hip fracture, right ([place]) 09/04/2020   Closed right hip fracture ([place]) 09/02/2020   Type 2 diabetes mellitus with hyperlipidemia ([place]) 09/02/2020   Thalassemia minor    HLD (hyperlipidemia)    Acute kidney injury superimposed on CKD ([place])    Depression    Fall at home, initial encounter    Nondisplaced fracture of greater trochanter of right femur, initial encounter for closed fracture ([place])     PCP:  [place] MD  REFERRING PROVIDER: Tinnie Gens, NP   REFERRING DIAG: PT eval/tx for strength and conditioning   THERAPY DIAG:  Muscle weakness (generalized)  Other abnormalities of gait and mobility  Difficulty in walking, not elsewhere classified  Rationale for Evaluation and  Treatment Rehabilitation  ONSET DATE: Chronic  SUBJECTIVE:   SUBJECTIVE STATEMENT:  Patient states she has to get another unit of blood tomorrow.  Her Hgb is 7 today. Reports no pain or issues.  Comes today ambulating with RW. States she would like to work on steps today as she is going to a relatives home for Christmas and will need to use the steps to get inside.  PERTINENT HISTORY: Paracentesis each week (Tuesdays) with 8L of removal Falls, weakness,DM, R shoulder reverse replacement an Feb 2019 R knee pain ([place])   PAIN:  Are you having pain? Yes: NPRS scale: 0/10 Pain location: knees BLE; feel tight Pain description: aching  Aggravating factors: use, movement, WB Relieving factors: rest, meds, non WB  PRECAUTIONS: Fall  WEIGHT BEARING RESTRICTIONS No  FALLS:  Has patient fallen in last 6 months? Yes. Number of falls 1  LIVING ENVIRONMENT: Lives with: lives with their family and lives with their spouse Lives in: House/apartment Stairs: No Has following equipment at home: Single point cane and Environmental consultant - 2 wheeled  OCCUPATION: Retired/ Disability   PLOF: Needs assistance with ADLs  PATIENT GOALS  Get to point of not having to use cane/ improve balance    OBJECTIVE:   DIAGNOSTIC FINDINGS: NA   COGNITION:  Overall cognitive status: Within functional limits for tasks assessed     SENSATION: [facility]   LOWER EXTREMITY MMT:  MMT Right eval Left eval Right 06/16/22 Left 06/16/22 Right 07/16/22 Left 07/16/22  Hip flexion 4 4 4- 4 4 4   Hip extension        Hip abduction        Hip adduction        Hip internal rotation        Hip external rotation        Knee flexion 4 4      Knee extension 4 4 4+ 4+ 4+ 4+  Ankle dorsiflexion 4 4 4+ 4+ 4+ 4+  Ankle plantarflexion        Ankle inversion        Ankle eversion         (Blank rows = not tested)  FUNCTIONAL TESTS:   Evaluation: 5 times sit to stand: 28.5 second with use of hands  2 minute walk test: 170  feet  06/16/22 5 x sit to stand 56 sec using hands; a couple times needs more than one attempt to stand 5 x sit to stand 2nd attempt; 33 sec using hands to assist up but improved form Unable to complete 2MWT    07/16/22   5X sit to stand from standard chair with 2" black foam for riser, no UE 27.33 sec   2 MWT with RW 135 feet   GAIT: Distance walked: 170 feet Assistive device utilized: Environmental consultant - 2 wheeled Level of assistance: Modified independence and SBA Comments: flexed trunk, decreased stride  TODAY'S TREATMENT: 08/06/22 Standing:  BTB at ankles hip abduction 2X10  BTB at ankles hip extensions 2X10  BTB at thighs marching 2X10 Seated:  standard chair with 2" foam riser 10X no UE Stair negotiation 4 steps, 7" height no HR's, +2 assist lateral up, forward down  07/30/22 Seated:  GTB rows, retractions, extensions 2X10 Standing:  GTB at ankles hip abduction 2X10  GTB at ankles hip extensions 2X10  GTB at ankles marching 2X10  07/17/22 Standing hip abduction with RTB at ankles 2X10 each  Hip extension with RTB at ankles 2X10 each  Marching high holds 2X10 RTB at ankles  Seated: sit to stands no UE 2X10 Ambulation from room to main entrance RW (150') no rest at EOS    07/16/22 Functional test measures 5X sit to stand from standard chair with 2" black foam for riser, no UE 27.33 sec 2 MWT with RW 135 feet  Ambulation with RW 270 feet Standing: hip abduction 2X10  Hip extension 2X10  Marching 2X10 Seated: Red theraband scap retraction 10X   Rows RTB 10X   Extensions RTB 10X  06/25/22 Standing: Heel raises 2 x 10 Hip abduction 2 x 10 Hip extension 2  x 10 Marching 2 x 10  LAQ's 2 x 10 2# each  06/16/22 5 x sit to stand 56 sec using hands; a couple times needs more than one attempt to stand 5 x sit to stand 2nd attempt; 33 sec using hands to assist  up but improved form Progress note 2 MWT unable today Seated  Heel/toe raises x 20 LAQ's 2" hold 2# x 20  each Marching 2#  x 20`  10.23.23 O2 sats monitored Seated:  LAQ 10X3" each (84-92%)  STS from standard chair with black foam no UE 10X (84% after completing 5/rest and complete 5X more after return to 92%)  Standing:  with UE assist   Alternating marching 2X10   Hip abduction 2X10   Minisquats 10X  06/06/22 Standing: Heel raises 2 x 10 Hip abduction 2 x 10 each Sidestepping table length x 3 down and back Mini squats x 10 Tandem stance 2 x 20" each  05/30/22  2MWT 155f with RW  STS 10x elevated height for no HHA needed  Heel raises  Toe raises  Marching  Squat  Educated on benefits of compression garments and use of butler to assist with donning. Measurements taken and given handout for ETI.  05/26/22 Nustep (seat 12) lv 2 4 min dynamic warmup  Step up 6 inch x 10 HHA x 2 Clinic ambulation 170 feet with RW  Sit to stand x10   05/23/22            Nustep hills 3 x 6' level 3            Step up 6" x 10 B            Marching x 15             Minisquat x 10             Side step at // with green theraband  x 3 RT            Sit to stand x 10 with green theraband      05/19/22                        Standing:  Heel raises  20X                         Toeraises 20X Marching 10X 2 sets 1 HHA with intermittent 2HHA                        Mini squat 10X 2 sets                         Side step at // x 2 RT    Sit to stand x10 no UE's from standard chair, no UE    Ambulation around clinic with RW for activity tolerance X150 feet without rest                     05/16/22                        Standing:                        Heel raises x 10                        Marching x 10                         Mini squat x 10  Side step at // x 2 RT                         Sit to stand x10   PATIENT EDUCATION:  Education details: on eval findings, POC and HEP  Person educated: Patient Education method: Explanation Education comprehension: verbalized  understanding   HOME EXERCISE PROGRAM:            07/16/22  Seated theraband exercises RTB (rows, retraction, extension)  05/30/22:  Begin walking program with RW  04/19/22 - Heel Raises with Counter Support  - 2 x daily - 7 x weekly - 1 sets - 10 reps - 3-5" hold - Mini Squat with Counter Support  - 2 x daily - 7 x weekly - 1 sets - 10 reps - 3-5" hold - Sit to Stand with Counter Support  - 2 x daily - 7 x weekly - 1 sets - 10 reps9/              Access Code: [email or web address] URL: [email or web address] Date: 05/01/2022 Prepared by: Josue Hector  Exercises - Seated Heel Toe Raises  - 2 x daily - 7 x weekly - 1 sets - 10 reps - Seated Long Arc Quad  - 2 x daily - 7 x weekly - 1 sets - 10 reps - Seated March  - 2 x daily - 7 x weekly - 1 sets - 10 reps - Supine Active Straight Leg Raise  - 7 x weekly - 1 sets - 10 reps  ASSESSMENT:  CLINICAL IMPRESSION: Continued with standing strengthening and increased to blue for more resistance.  Worked on stair negotiation without handrails (will not have handrails).  Pt unable to negotiate 4  7" steps backwards but able to complete sideways with assist of 2 ascending and forward step down descending with assist of 1.  This activity wore patient out quickly.    Continued need to use 2 inch riser in chair in order to stand without use of UE's.  Patient will continue to benefit from continued skilled therapy services  to address deficits and promote return to optimal function.    OBJECTIVE IMPAIRMENTS Abnormal gait, decreased activity tolerance, decreased balance, decreased mobility, difficulty walking, decreased ROM, decreased strength, improper body mechanics, and pain.   ACTIVITY LIMITATIONS lifting, bending, standing, squatting, stairs, transfers, and locomotion level  PARTICIPATION LIMITATIONS: meal prep, cleaning, laundry, shopping, community activity, and yard work  PERSONAL FACTORS Past/current experiences and Time since onset of  injury/illness/exacerbation are also affecting patient's functional outcome.   REHAB POTENTIAL: Good  CLINICAL DECISION MAKING: Stable/uncomplicated  EVALUATION COMPLEXITY: Low   GOALS: SHORT TERM GOALS: Target date: 05/29/2022  Patient will be independent with initial HEP and self-management strategies to improve functional outcomes Baseline:  Goal status: IN PROGRESS  2. Patient will be able to stand, with minimal support, at least 5 minutes for improved ability to perform meal preparation/ cooking/ grooming/ cleaning ADLs.  Baseline: 2.5 minutes Goal status: IN PROGRESS  LONG TERM GOALS: Target date: 07/14/2022  Patient will be independent with advanced HEP and self-management strategies to improve functional outcomes Baseline:  Goal status: IN PROGRESS  2.  Patient will report at least 65% overall improvement in subjective complaint to indicate improvement in ability to perform ADLs. Baseline:  Goal status: IN PROGRESS  3.  Patient will be able to stand, with minimal support, at least 10 minutes for improved ability to perform meal preparation/ cooking/ grooming/ cleaning ADLs.  Baseline: 2.5 minutes Goal status: IN PROGRESS  4. Patient will have equal to or > 4+/5 MMT throughout BLE to improve ability to perform functional mobility, stair ambulation and ADLs.  Baseline: See MMT Goal status: IN PROGRESS  5. Patient will be able to ambulate at least 300 feet during 2MWT with LRAD to demonstrate improved ability to perform functional mobility and associated tasks. Baseline: 170 feet Goal status: IN PROGRESS  PLAN: PT FREQUENCY: 2x/week  PT DURATION: 4 more weeks  PLANNED INTERVENTIONS: Therapeutic exercises, Therapeutic activity, Neuromuscular re-education, Balance training, Gait training, Patient/Family education, Joint manipulation, Joint mobilization, Stair training, Aquatic Therapy, Dry Needling, Electrical stimulation, Spinal manipulation, Spinal mobilization,  Cryotherapy, Moist heat, scar mobilization, Taping, Traction, Ultrasound, Biofeedback, Ionotophoresis 46m/ml Dexamethasone, and Manual therapy.  PLAN FOR NEXT SESSION: continue to progress functional LE strength, work on ambulation tolerance and balance.  Increase balance challenges; add vectors next session.  Review stair negotiation again if have additional assistance.  ATeena Irani PTA/CLT CSouth MonroePh: [phone] 4:18 PM, 08/06/22

## 2022-08-06 NOTE — Addendum Note (Signed)
Addended by: Donnie Aho on: 08/06/2022 03:10 PM   Modules accepted: Orders

## 2022-08-07 ENCOUNTER — Inpatient Hospital Stay: Payer: 59

## 2022-08-07 DIAGNOSIS — M6281 Muscle weakness (generalized): Secondary | ICD-10-CM | POA: Diagnosis not present

## 2022-08-07 DIAGNOSIS — D500 Iron deficiency anemia secondary to blood loss (chronic): Secondary | ICD-10-CM

## 2022-08-07 MED ORDER — SODIUM CHLORIDE 0.9% FLUSH
10.0000 mL | INTRAVENOUS | Status: AC | PRN
  Administered 2022-08-07: 10 mL

## 2022-08-07 MED ORDER — ACETAMINOPHEN 325 MG PO TABS
650.0000 mg | ORAL_TABLET | Freq: Once | ORAL | Status: AC
  Administered 2022-08-07: 650 mg via ORAL
  Filled 2022-08-07: qty 2

## 2022-08-07 MED ORDER — HEPARIN SOD (PORK) LOCK FLUSH 100 UNIT/ML IV SOLN
500.0000 [IU] | Freq: Every day | INTRAVENOUS | Status: AC | PRN
  Administered 2022-08-07: 500 [IU]

## 2022-08-07 MED ORDER — DIPHENHYDRAMINE HCL 25 MG PO CAPS: 25.0000 mg | ORAL_CAPSULE | Freq: Once | ORAL | Status: DC

## 2022-08-07 MED ORDER — SODIUM CHLORIDE 0.9% IV SOLUTION
250.0000 mL | Freq: Once | INTRAVENOUS | Status: AC
  Administered 2022-08-07: 250 mL via INTRAVENOUS

## 2022-08-07 NOTE — Patient Instructions (Signed)
[facility]  Discharge Instructions: Thank you for choosing [facility] to provide your oncology and hematology care.  If you have a lab appointment with the [facility], please come in thru the Main Entrance and check in at the main information desk.  Wear comfortable clothing and clothing appropriate for easy access to any Portacath or PICC line.   We strive to give you quality time with your provider. You may need to reschedule your appointment if you arrive late (15 or more minutes).  Arriving late affects you and other patients whose appointments are after yours.  Also, if you miss three or more appointments without notifying the office, you may be dismissed from the clinic at the provider's discretion.      For prescription refill requests, have your pharmacy contact our office and allow 72 hours for refills to be completed.    Today you received the following chemotherapy and/or immunotherapy agents [id number]      To help prevent nausea and vomiting after your treatment, we encourage you to take your nausea medication as directed.  BELOW ARE SYMPTOMS THAT SHOULD BE REPORTED IMMEDIATELY: *FEVER GREATER THAN 100.4 F (38 C) OR HIGHER *CHILLS OR SWEATING *NAUSEA AND VOMITING THAT IS NOT CONTROLLED WITH YOUR NAUSEA MEDICATION *UNUSUAL SHORTNESS OF BREATH *UNUSUAL BRUISING OR BLEEDING *URINARY PROBLEMS (pain or burning when urinating, or frequent urination) *BOWEL PROBLEMS (unusual diarrhea, constipation, pain near the anus) TENDERNESS IN MOUTH AND THROAT WITH OR WITHOUT PRESENCE OF ULCERS (sore throat, sores in mouth, or a toothache) UNUSUAL RASH, SWELLING OR PAIN  UNUSUAL VAGINAL DISCHARGE OR ITCHING   Items with * indicate a potential emergency and should be followed up as soon as possible or go to the Emergency Department if any problems should occur.  Please show the CHEMOTHERAPY ALERT CARD or IMMUNOTHERAPY ALERT CARD at check-in to the Emergency  Department and triage nurse.  Should you have questions after your visit or need to cancel or reschedule your appointment, please contact [facility] [phone]  and follow the prompts.  Office hours are 8:00 a.m. to 4:30 p.m. Monday - Friday. Please note that voicemails left after 4:00 p.m. may not be returned until the following business day.  We are closed weekends and major holidays. You have access to a nurse at all times for urgent questions. Please call the main number to the clinic [phone] and follow the prompts.  For any non-urgent questions, you may also contact your provider using MyChart. We now offer e-Visits for anyone 40 and older to request care online for non-urgent symptoms. For details visit [email or web address].   Also download the MyChart app! Go to the app store, search "MyChart", open the app, select [facility], and log in with your MyChart username and password.  Masks are optional in the cancer centers. If you would like for your care team to wear a mask while they are taking care of you, please let them know. You may have one support person who is at least 64 years old accompany you for your appointments.

## 2022-08-07 NOTE — Progress Notes (Signed)
Patient presents today for one Unit of Packed Red Blood Cells per providers order.  Vital signs WNL.  Patient has no new complaints at this time.  Patient took Benadryl prior to appointment.    Stable during transfusion without adverse affects.  Vital signs stable.  No complaints at this time.  Discharge from [facility] ambulatory in stable condition.  Alert and oriented X 3.  Follow up with Rachel Huber as scheduled.

## 2022-08-08 ENCOUNTER — Encounter (HOSPITAL_COMMUNITY): Payer: 59

## 2022-08-08 LAB — TYPE AND SCREEN
ABO/RH(D): A POS
Antibody Screen: NEGATIVE
Unit division: 0

## 2022-08-08 LAB — BPAM RBC
Blood Product Expiration Date: 202401162359
ISSUE DATE / TIME: 202312211044
Unit Type and Rh: 6200

## 2022-08-12 ENCOUNTER — Inpatient Hospital Stay: Payer: 59

## 2022-08-12 ENCOUNTER — Other Ambulatory Visit: Payer: Self-pay

## 2022-08-12 ENCOUNTER — Ambulatory Visit
Admission: RE | Admit: 2022-08-12 | Discharge: 2022-08-12 | Disposition: A | Discharge status: Home / Self Care | Payer: 59 | Source: Ambulatory Visit | Attending: Gastroenterology | Admitting: Gastroenterology

## 2022-08-12 DIAGNOSIS — K7581 Nonalcoholic steatohepatitis (NASH): Secondary | ICD-10-CM | POA: Diagnosis not present

## 2022-08-12 DIAGNOSIS — R188 Other ascites: Secondary | ICD-10-CM | POA: Diagnosis not present

## 2022-08-12 DIAGNOSIS — K7460 Unspecified cirrhosis of liver: Secondary | ICD-10-CM | POA: Diagnosis not present

## 2022-08-12 DIAGNOSIS — D500 Iron deficiency anemia secondary to blood loss (chronic): Secondary | ICD-10-CM

## 2022-08-12 MED ORDER — ALBUMIN HUMAN 25 % IV SOLN
25.0000 g | Freq: Once | INTRAVENOUS | Status: AC
  Administered 2022-08-12: 25 g via INTRAVENOUS

## 2022-08-12 MED ORDER — ALBUMIN HUMAN 25 % IV SOLN
INTRAVENOUS | Status: AC
  Administered 2022-08-12: 25 g via INTRAVENOUS
  Filled 2022-08-12: qty 100

## 2022-08-12 MED ORDER — ALBUMIN HUMAN 25 % IV SOLN
INTRAVENOUS | Status: AC
  Administered 2022-08-12: 12.5 g via INTRAVENOUS
  Filled 2022-08-12: qty 100

## 2022-08-12 MED ORDER — HEPARIN SOD (PORK) LOCK FLUSH 100 UNIT/ML IV SOLN
INTRAVENOUS | Status: AC
  Filled 2022-08-12: qty 5

## 2022-08-12 MED ORDER — LIDOCAINE HCL (PF) 1 % IJ SOLN
10.0000 mL | Freq: Once | INTRAMUSCULAR | Status: AC
  Administered 2022-08-12: 10 mL via INTRADERMAL
  Filled 2022-08-12: qty 10

## 2022-08-13 ENCOUNTER — Inpatient Hospital Stay: Payer: 59

## 2022-08-13 ENCOUNTER — Telehealth (HOSPITAL_COMMUNITY): Payer: Self-pay

## 2022-08-13 ENCOUNTER — Encounter (HOSPITAL_COMMUNITY): Payer: 59

## 2022-08-13 DIAGNOSIS — E559 Vitamin D deficiency, unspecified: Secondary | ICD-10-CM

## 2022-08-13 DIAGNOSIS — D500 Iron deficiency anemia secondary to blood loss (chronic): Secondary | ICD-10-CM

## 2022-08-13 DIAGNOSIS — D561 Beta thalassemia: Secondary | ICD-10-CM

## 2022-08-13 DIAGNOSIS — M6281 Muscle weakness (generalized): Secondary | ICD-10-CM | POA: Diagnosis not present

## 2022-08-13 DIAGNOSIS — E538 Deficiency of other specified B group vitamins: Secondary | ICD-10-CM

## 2022-08-13 LAB — COMPREHENSIVE METABOLIC PANEL
ALT: 26 U/L (ref 0–44)
AST: 52 U/L — ABNORMAL HIGH (ref 15–41)
Albumin: 2.6 g/dL — ABNORMAL LOW (ref 3.5–5.0)
Alkaline Phosphatase: 108 U/L (ref 38–126)
Anion gap: 6 (ref 5–15)
BUN: 36 mg/dL — ABNORMAL HIGH (ref 8–23)
CO2: 23 mmol/L (ref 22–32)
Calcium: 8 mg/dL — ABNORMAL LOW (ref 8.9–10.3)
Chloride: 108 mmol/L (ref 98–111)
Creatinine, Ser: 1.7 mg/dL — ABNORMAL HIGH (ref 0.44–1.00)
GFR, Estimated: 33 mL/min — ABNORMAL LOW (ref >60)
Glucose, Bld: 179 mg/dL — ABNORMAL HIGH (ref 70–99)
Potassium: 4.1 mmol/L (ref 3.5–5.1)
Sodium: 137 mmol/L (ref 135–145)
Total Bilirubin: 2.1 mg/dL — ABNORMAL HIGH (ref 0.3–1.2)
Total Protein: 4.8 g/dL — ABNORMAL LOW (ref 6.5–8.1)

## 2022-08-13 LAB — CBC WITH DIFFERENTIAL/PLATELET
Abs Immature Granulocytes: 0.01 10*3/uL (ref 0.00–0.07)
Basophils Absolute: 0 10*3/uL (ref 0.0–0.1)
Basophils Relative: 0 %
Eosinophils Absolute: 0.2 10*3/uL (ref 0.0–0.5)
Eosinophils Relative: 6 %
HCT: 21.8 % — ABNORMAL LOW (ref 36.0–46.0)
Hemoglobin: 6.9 g/dL — CL (ref 12.0–15.0)
Immature Granulocytes: 0 %
Lymphocytes Relative: 36 %
Lymphs Abs: 1 10*3/uL (ref 0.7–4.0)
MCH: 24.6 pg — ABNORMAL LOW (ref 26.0–34.0)
MCHC: 31.7 g/dL (ref 30.0–36.0)
MCV: 77.9 fL — ABNORMAL LOW (ref 80.0–100.0)
Monocytes Absolute: 0.3 10*3/uL (ref 0.1–1.0)
Monocytes Relative: 9 %
Neutro Abs: 1.3 10*3/uL — ABNORMAL LOW (ref 1.7–7.7)
Neutrophils Relative %: 49 %
Platelets: 109 10*3/uL — ABNORMAL LOW (ref 150–400)
RBC: 2.8 MIL/uL — ABNORMAL LOW (ref 3.87–5.11)
RDW: 22.3 % — ABNORMAL HIGH (ref 11.5–15.5)
WBC: 2.7 10*3/uL — ABNORMAL LOW (ref 4.0–10.5)
nRBC: 0 % (ref 0.0–0.2)

## 2022-08-13 LAB — VITAMIN B12: Vitamin B-12: 723 pg/mL (ref 180–914)

## 2022-08-13 LAB — PREPARE RBC (CROSSMATCH)

## 2022-08-13 LAB — IRON AND TIBC
Iron: 84 ug/dL (ref 28–170)
Saturation Ratios: 59 % — ABNORMAL HIGH (ref 10.4–31.8)
TIBC: 142 ug/dL — ABNORMAL LOW (ref 250–450)
UIBC: 58 ug/dL

## 2022-08-13 LAB — VITAMIN D 25 HYDROXY (VIT D DEFICIENCY, FRACTURES): Vit D, 25-Hydroxy: 49.25 ng/mL (ref 30–100)

## 2022-08-13 LAB — FERRITIN: Ferritin: 440 ng/mL — ABNORMAL HIGH (ref 11–307)

## 2022-08-13 MED ORDER — SODIUM CHLORIDE 0.9% FLUSH
10.0000 mL | INTRAVENOUS | Status: DC | PRN
  Administered 2022-08-13: 10 mL via INTRAVENOUS

## 2022-08-13 MED ORDER — HEPARIN SOD (PORK) LOCK FLUSH 100 UNIT/ML IV SOLN
500.0000 [IU] | Freq: Once | INTRAVENOUS | Status: AC
  Administered 2022-08-13: 500 [IU] via INTRAVENOUS

## 2022-08-13 NOTE — Progress Notes (Signed)
Pt presents today for labs for possible blood transfusion per provider's order. Vital signs stable, port flushed easily with 10 mL of normal saline and 5 mL of heparin. Good blood return noted and needle removed intact. No bruising or swelling noted at the site.  Discharged from clinic via wheelchair in stable condition. Alert and oriented x 3. F/U with [facility] as scheduled.

## 2022-08-13 NOTE — Telephone Encounter (Signed)
No show, called and spoke to pt who stated she had fallen asleep. Reminded next apt date and time, pt stated she plans to be there.  Ihor Austin, LPTA/CLT; Delana Meyer [phone]

## 2022-08-14 ENCOUNTER — Inpatient Hospital Stay: Payer: 59

## 2022-08-14 DIAGNOSIS — M6281 Muscle weakness (generalized): Secondary | ICD-10-CM | POA: Diagnosis not present

## 2022-08-14 DIAGNOSIS — D500 Iron deficiency anemia secondary to blood loss (chronic): Secondary | ICD-10-CM

## 2022-08-14 MED ORDER — SODIUM CHLORIDE 0.9% IV SOLUTION
250.0000 mL | Freq: Once | INTRAVENOUS | Status: AC
  Administered 2022-08-14: 250 mL via INTRAVENOUS

## 2022-08-14 MED ORDER — HEPARIN SOD (PORK) LOCK FLUSH 100 UNIT/ML IV SOLN
500.0000 [IU] | Freq: Every day | INTRAVENOUS | Status: AC | PRN
  Administered 2022-08-14: 500 [IU]

## 2022-08-14 MED ORDER — SODIUM CHLORIDE 0.9% FLUSH
10.0000 mL | INTRAVENOUS | Status: AC | PRN
  Administered 2022-08-14: 10 mL

## 2022-08-14 NOTE — Progress Notes (Signed)
Pt presents today for 2 units of blood per provider's order. Vital signs stable and pt voiced no new complaints at this time.  2 units of blood given today per MD orders. Tolerated infusion without adverse affects. Vital signs stable. No complaints at this time. Discharged from clinic via wheelchair in stable condition. Alert and oriented x 3. F/U with [facility] as scheduled.

## 2022-08-14 NOTE — Patient Instructions (Signed)
[facility]  Discharge Instructions: Thank you for choosing [facility] to provide your oncology and hematology care.  If you have a lab appointment with the [facility], please come in thru the Main Entrance and check in at the main information desk.  Wear comfortable clothing and clothing appropriate for easy access to any Portacath or PICC line.   We strive to give you quality time with your provider. You may need to reschedule your appointment if you arrive late (15 or more minutes).  Arriving late affects you and other patients whose appointments are after yours.  Also, if you miss three or more appointments without notifying the office, you may be dismissed from the clinic at the provider's discretion.      For prescription refill requests, have your pharmacy contact our office and allow 72 hours for refills to be completed.    Today you received 2 units of blood.     BELOW ARE SYMPTOMS THAT SHOULD BE REPORTED IMMEDIATELY: *FEVER GREATER THAN 100.4 F (38 C) OR HIGHER *CHILLS OR SWEATING *NAUSEA AND VOMITING THAT IS NOT CONTROLLED WITH YOUR NAUSEA MEDICATION *UNUSUAL SHORTNESS OF BREATH *UNUSUAL BRUISING OR BLEEDING *URINARY PROBLEMS (pain or burning when urinating, or frequent urination) *BOWEL PROBLEMS (unusual diarrhea, constipation, pain near the anus) TENDERNESS IN MOUTH AND THROAT WITH OR WITHOUT PRESENCE OF ULCERS (sore throat, sores in mouth, or a toothache) UNUSUAL RASH, SWELLING OR PAIN  UNUSUAL VAGINAL DISCHARGE OR ITCHING   Items with * indicate a potential emergency and should be followed up as soon as possible or go to the Emergency Department if any problems should occur.  Please show the CHEMOTHERAPY ALERT CARD or IMMUNOTHERAPY ALERT CARD at check-in to the Emergency Department and triage nurse.  Should you have questions after your visit or need to cancel or reschedule your appointment, please contact [facility] [phone]  and follow the prompts.  Office hours are 8:00 a.m. to 4:30 p.m. Monday - Friday. Please note that voicemails left after 4:00 p.m. may not be returned until the following business day.  We are closed weekends and major holidays. You have access to a nurse at all times for urgent questions. Please call the main number to the clinic [phone] and follow the prompts.  For any non-urgent questions, you may also contact your provider using MyChart. We now offer e-Visits for anyone 31 and older to request care online for non-urgent symptoms. For details visit [email or web address].   Also download the MyChart app! Go to the app store, search "MyChart", open the app, select [facility], and log in with your MyChart username and password.

## 2022-08-15 ENCOUNTER — Ambulatory Visit (HOSPITAL_COMMUNITY): Payer: 59

## 2022-08-15 DIAGNOSIS — R262 Difficulty in walking, not elsewhere classified: Secondary | ICD-10-CM

## 2022-08-15 DIAGNOSIS — M6281 Muscle weakness (generalized): Secondary | ICD-10-CM

## 2022-08-15 DIAGNOSIS — R2689 Other abnormalities of gait and mobility: Secondary | ICD-10-CM

## 2022-08-15 LAB — BPAM RBC
Blood Product Expiration Date: 202401142359
Blood Product Expiration Date: 202401172359
ISSUE DATE / TIME: 202312281024
ISSUE DATE / TIME: 202312281228
Unit Type and Rh: 6200
Unit Type and Rh: 6200

## 2022-08-15 LAB — TYPE AND SCREEN
ABO/RH(D): A POS
Antibody Screen: NEGATIVE
Unit division: 0
Unit division: 0

## 2022-08-15 LAB — METHYLMALONIC ACID, SERUM: Methylmalonic Acid, Quantitative: 563 nmol/L — ABNORMAL HIGH (ref 0–378)

## 2022-08-15 NOTE — Therapy (Signed)
OUTPATIENT PHYSICAL THERAPY DISCHARGE/PROGRESS NOTE PHYSICAL THERAPY DISCHARGE SUMMARY  Visits from Start of Care: 16  Current functional level related to goals / functional outcomes: See below   Remaining deficits: See below   Education / Equipment: See below   Patient agrees to discharge. Patient goals were not met. Patient is being discharged due to lack of progress.  Progress Note Reporting Period 05/01/2022 to 08/15/2022  See note below for Objective Data and Assessment of Progress/Goals.      Patient Name: ALLISYN KUNZ MRN: [id number] DOB:08/24/1955, 66 y.o.,, female Today's Date: 08/15/2022   PT End of Session - 08/15/22 1301     Visit Number 16    Number of Visits 20    Date for PT Re-Evaluation 08/15/22    Authorization Type [employer] QH (no VL)    Progress Note Due on Visit 22    PT Start Time 1300    PT Stop Time 1344    PT Time Calculation (min) 44 min    Activity Tolerance Patient tolerated treatment well;Patient limited by pain;Patient limited by fatigue    Behavior During Therapy [facility] for tasks assessed/performed                   Past Medical History:  Diagnosis Date   Anxiety    Arthritis    Cirrhosis of liver ([place])    Diabetes mellitus without complication (HCC)    Dyspnea    DOE   GERD (gastroesophageal reflux disease)    Grade IV internal hemorrhoids    Contingency plan for any future admissions for severe anemia in the setting of persistent GI bleed:  nuclear medicine tagged RBC scan to assess for location of GI bleeding - if from rectum, would then proceed to angiogram with possible repeat embolization.  Please notify IR in this case.   Hepatitis    PAST   Hypertension    Neuropathy    Neuropathy, diabetic ([place])    Pneumonia    Sinus complaint    Sleep apnea    CPAP   Thalassemia minor 1992   Past Surgical History:  Procedure Laterality Date   ABDOMINAL HYSTERECTOMY     BREAST BIOPSY Right 02/15/2018   [place] bx  6-6:30 ribbon shape, ONE CORE FRAGMENT WITH FIBROSIS. ONE CORE FRAGMENT WITH PORTION OF A DILATED   BREAST BIOPSY Right 02/15/2018   [place] bx 9:00 heart shape, USUAL DUCTAL HYPERPLASIA   BREAST LUMPECTOMY Right 03/09/2018   Procedure: BREAST LUMPECTOMY x 2;  Surgeon: Benjamine Sprague, DO;  Location: [facility] ORS;  Service: General;  Laterality: Right;   CATARACT EXTRACTION W/PHACO Left 06/11/2016   Procedure: CATARACT EXTRACTION PHACO AND INTRAOCULAR LENS PLACEMENT (IOC);  Surgeon: Estill Cotta, MD;  Location: [facility] ORS;  Service: Ophthalmology;  Laterality: Left;  Lot # [id number] H US:01:38.6 AP%:26.4 CDE:44.15   CATARACT EXTRACTION W/PHACO Right 06/15/2018   Procedure: CATARACT EXTRACTION PHACO AND INTRAOCULAR LENS PLACEMENT (IOC);  Surgeon: Birder Robson, MD;  Location: [facility] ORS;  Service: Ophthalmology;  Laterality: Right;  [place] 00:38.2 CDE 4.23 Fluid Pack Lot # [id number]   COLONOSCOPIES     COLONOSCOPY WITH PROPOFOL N/A 10/10/2020   Procedure: COLONOSCOPY WITH PROPOFOL;  Surgeon: Lesly Rubenstein, MD;  Location[facility] ENDOSCOPY;  Service: Endoscopy;  Laterality: N/A;   COLONOSCOPY WITH PROPOFOL N/A 11/20/2020   Procedure: COLONOSCOPY WITH PROPOFOL;  Surgeon: Lesly Rubenstein, MD;  Location: [facility] ENDOSCOPY;  Service: Endoscopy;  Laterality: N/A;  DM STAT CBC, BMP COVID POSITIVE 09/02/2020  COLONOSCOPY WITH PROPOFOL N/A 06/19/2022   Procedure: COLONOSCOPY WITH PROPOFOL;  Surgeon: Lesly Rubenstein, MD;  Location: [facility] ENDOSCOPY;  Service: Endoscopy;  Laterality: N/A;   CTR     ESOPHAGOGASTRODUODENOSCOPY (EGD) WITH PROPOFOL N/A 10/10/2020   Procedure: ESOPHAGOGASTRODUODENOSCOPY (EGD) WITH PROPOFOL;  Surgeon: Lesly Rubenstein, MD;  Location: [facility] ENDOSCOPY;  Service: Endoscopy;  Laterality: N/A;  COVID POSITIVE 10/08/2020   FLEXIBLE SIGMOIDOSCOPY N/A 02/06/2022   Procedure: FLEXIBLE SIGMOIDOSCOPY;  Surgeon: Lesly Rubenstein, MD;  Location: [facility] ENDOSCOPY;  Service: Endoscopy;  Laterality:  N/A;  Patient requests anesthesia   IR ANGIOGRAM SELECTIVE EACH ADDITIONAL VESSEL  02/07/2022   IR ANGIOGRAM SELECTIVE EACH ADDITIONAL VESSEL  02/07/2022   IR ANGIOGRAM VISCERAL SELECTIVE  02/07/2022   IR EMBO ARTERIAL NOT HEMORR HEMANG INC GUIDE ROADMAPPING  02/07/2022   IR IMAGING GUIDED PORT INSERTION  06/13/2022   IR PARACENTESIS  12/17/2021   IR PARACENTESIS  03/25/2022   IR RADIOLOGIST EVAL & MGMT  03/18/2022   IR RADIOLOGIST EVAL & MGMT  05/07/2022   IR RADIOLOGIST EVAL & MGMT  05/16/2022   IR US GUIDE VASC ACCESS RIGHT  02/07/2022   JOINT REPLACEMENT     KNEE SURGERY Right 09/24/2017   plates and pins   TOTAL SHOULDER ARTHROPLASTY Right 09/27/2015   Patient Active Problem List   Diagnosis Date Noted   GI bleed 06/18/2022   Rectal bleeding 06/17/2022   Obesity (BMI 30-39.9) 06/17/2022   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 mellitus ([place]) 06/17/2022   Grade IV internal hemorrhoids 05/16/2022   ABLA (acute blood loss anemia) 03/20/2022   Acute on chronic blood loss anemia 03/05/2022   Thrombocytopenia ([place]) 03/05/2022   Fever 02/08/2022   Pancytopenia ([place]) 02/07/2022   GI bleeding 02/04/2022   Chronic kidney disease, stage 3b ([place]) 02/04/2022   Depression with anxiety 02/04/2022   Liver cirrhosis secondary to NASH ([place])    Acute GI hemorrhage 01/28/2022   AKI (acute kidney injury) ([place]) 01/27/2022   Insomnia 01/27/2022   Iron deficiency anemia due to chronic blood loss 02/13/2021   History of uterine cancer 02/13/2021   Decompensated hepatic cirrhosis ([place]) 10/25/2020   OSA on CPAP 10/25/2020   Glossitis 10/25/2020   Hyponatremia 10/08/2020   Acute respiratory failure with hypoxia ([place]) 09/10/2020   Pneumonia due to COVID-19 virus 09/08/2020   Hip fracture, right ([place]) 09/04/2020   Closed right hip fracture ([place]) 09/02/2020   Type 2 diabetes mellitus with hyperlipidemia ([place]) 09/02/2020   Thalassemia minor    HLD (hyperlipidemia)    Acute kidney injury superimposed on CKD  ([place])    Depression    Fall at home, initial encounter    Nondisplaced fracture of greater trochanter of right femur, initial encounter for closed fracture ([place])     PCP: Frazier Richards MD  REFERRING PROVIDER: Tinnie Gens, NP   REFERRING DIAG: PT eval/tx for strength and conditioning   THERAPY DIAG:  Muscle weakness (generalized)  Other abnormalities of gait and mobility  Difficulty in walking, not elsewhere classified  Rationale for Evaluation and Treatment Rehabilitation  ONSET DATE: Chronic  SUBJECTIVE:   SUBJECTIVE STATEMENT:  Had to get more blood yesterday due to continued low iron; [facility] to discuss a revision for her TIPS procedure initially done back in May (revision in June) as she states she should not be retaining so much fluid at this point; continued knee pain 6/10; more with the increased swelling from her liver cirrhosis.  Having a mammogram, PAP smear and teeth pulled to prepare for trying to get on the liver transplant list.  Feels her continued leg swelling and right knee pain limit her progress   PERTINENT HISTORY: Paracentesis each week (Tuesdays) with 8L of removal Falls, weakness,DM, R shoulder reverse replacement an Feb 2019 R knee pain (OA)   PAIN:  Are you having pain? Yes: NPRS scale: 6/10 Pain location: knees BLE; feel tight Pain description: aching  Aggravating factors: use, movement, WB Relieving factors: rest, meds, non WB  PRECAUTIONS: Fall  WEIGHT BEARING RESTRICTIONS No  FALLS:  Has patient fallen in last 6 months? Yes. Number of falls 1  LIVING ENVIRONMENT: Lives with: lives with their family and lives with their spouse Lives in: House/apartment Stairs: No Has following equipment at home: Single point cane and Environmental consultant - 2 wheeled  OCCUPATION: Retired/ Disability   PLOF: Needs assistance with ADLs  PATIENT GOALS  Get to point of not having to use cane/ improve balance    OBJECTIVE:    DIAGNOSTIC FINDINGS: NA   COGNITION:  Overall cognitive status: Within functional limits for tasks assessed     SENSATION: [facility]   LOWER EXTREMITY MMT:  MMT Right eval Left eval Right 06/16/22 Left 06/16/22 Right 07/16/22 Left 07/16/22 Right 08/15/22 Left 08/15/22  Hip flexion 4 4 4- _0 4- 4-  Hip extension          Hip abduction          Hip adduction          Hip internal rotation          Hip external rotation          Knee flexion 4 4        Knee extension 4 4 4+ 4+ 4+ 4+ 4+ 4+  Ankle dorsiflexion 4 4 4+ 4+ 4+ 4+ 4+ 4+  Ankle plantarflexion          Ankle inversion          Ankle eversion           (Blank rows = not tested)  FUNCTIONAL TESTS:   Evaluation: 5 times sit to stand: 28.5 second with use of hands  2 minute walk test: 170 feet  06/16/22 5 x sit to stand 56 sec using hands; a couple times needs more than one attempt to stand 5 x sit to stand 2nd attempt; 33 sec using hands to assist up but improved form Unable to complete 2MWT    07/16/22   5X sit to stand from standard chair with 2" black foam for riser, no UE 27.33 sec   2 MWT with RW 135 feet   GAIT: Distance walked: 170 feet Assistive device utilized: Environmental consultant - 2 wheeled Level of assistance: Modified independence and SBA Comments: flexed trunk, decreased stride  TODAY'S TREATMENT: 08/15/22 Progress note 5 x sit to stand 46.34 sec using // bars to pull up (decline) 2 MWT 128 ft with RW max fatigue after walking Review of HEP and goals   08/06/22 Standing:  BTB at ankles hip abduction 2X10  BTB at ankles hip extensions 2X10  BTB at thighs marching 2X10 Seated:  standard chair with 2" foam riser 10X no UE Stair negotiation 4 steps, 7" height no HR's, +2 assist lateral up, forward down  07/30/22 Seated:  GTB rows, retractions, extensions 2X10 Standing:  GTB at ankles hip abduction 2X10  GTB at ankles hip extensions 2X10  GTB at ankles marching  2X10  07/17/22 Standing hip  abduction with RTB at ankles 2X10 each  Hip extension with RTB at ankles 2X10 each  Marching high holds 2X10 RTB at ankles  Seated: sit to stands no UE 2X10 Ambulation from room to main entrance RW (150') no rest at EOS    07/16/22 Functional test measures 5X sit to stand from standard chair with 2" black foam for riser, no UE 27.33 sec 2 MWT with RW 135 feet  Ambulation with RW 270 feet Standing: hip abduction 2X10  Hip extension 2X10  Marching 2X10 Seated: Red theraband scap retraction 10X   Rows RTB 10X   Extensions RTB 10X  06/25/22 Standing: Heel raises 2 x 10 Hip abduction 2 x 10 Hip extension 2  x 10 Marching 2 x 10  LAQ's 2 x 10 2# each  06/16/22 5 x sit to stand 56 sec using hands; a couple times needs more than one attempt to stand 5 x sit to stand 2nd attempt; 33 sec using hands to assist up but improved form Progress note 2 MWT unable today Seated  Heel/toe raises x 20 LAQ's 2" hold 2# x 20 each Marching 2#  x 20`  10.23.23 O2 sats monitored Seated:  LAQ 10X3" each (84-92%)  STS from standard chair with black foam no UE 10X (84% after completing 5/rest and complete 5X more after return to 92%)  Standing:  with UE assist   Alternating marching 2X10   Hip abduction 2X10   Minisquats 10X  06/06/22 Standing: Heel raises 2 x 10 Hip abduction 2 x 10 each Sidestepping table length x 3 down and back Mini squats x 10 Tandem stance 2 x 20" each  05/30/22  2MWT 175f with RW  STS 10x elevated height for no HHA needed  Heel raises  Toe raises  Marching  Squat  Educated on benefits of compression garments and use of butler to assist with donning. Measurements taken and given handout for ETI.  05/26/22 Nustep (seat 12) lv 2 4 min dynamic warmup  Step up 6 inch x 10 HHA x 2 Clinic ambulation 170 feet with RW  Sit to stand x10   05/23/22            Nustep hills 3 x 6' level 3            Step up 6" x 10 B            Marching x 15              Minisquat x 10             Side step at // with green theraband  x 3 RT            Sit to stand x 10 with green theraband      05/19/22                        Standing:  Heel raises  20X                         Toeraises 20X Marching 10X 2 sets 1 HHA with intermittent 2HHA                        Mini squat 10X 2 sets  Side step at // x 2 RT    Sit to stand x10 no UE's from standard chair, no UE    Ambulation around clinic with RW for activity tolerance X150 feet without rest                     05/16/22                        Standing:                        Heel raises x 10                        Marching x 10                         Mini squat x 10                         Side step at // x 2 RT                         Sit to stand x10   PATIENT EDUCATION:  Education details: on eval findings, POC and HEP  Person educated: Patient Education method: Explanation Education comprehension: verbalized understanding   HOME EXERCISE PROGRAM:            07/16/22  Seated theraband exercises RTB (rows, retraction, extension)  05/30/22:  Begin walking program with RW  04/19/22 - Heel Raises with Counter Support  - 2 x daily - 7 x weekly - 1 sets - 10 reps - 3-5" hold - Mini Squat with Counter Support  - 2 x daily - 7 x weekly - 1 sets - 10 reps - 3-5" hold - Sit to Stand with Counter Support  - 2 x daily - 7 x weekly - 1 sets - 10 reps9/              Access Code: [email or web address] URL: [email or web address] Date: 05/01/2022 Prepared by: Josue Hector  Exercises - Seated Heel Toe Raises  - 2 x daily - 7 x weekly - 1 sets - 10 reps - Seated Long Arc Quad  - 2 x daily - 7 x weekly - 1 sets - 10 reps - Seated March  - 2 x daily - 7 x weekly - 1 sets - 10 reps - Supine Active Straight Leg Raise  - 7 x weekly - 1 sets - 10 reps  ASSESSMENT:  CLINICAL IMPRESSION: Progress note today ; patient with a decline in all functional tests likely due to continued  fluid accumulation and bilateral knee OA; Right > Left.  Discussed with patient discharge today due to lack of progress and her need to address some other medical needs; she has multiple medical appointments upcoming.  Patient is agreeable to discharge today and continuing with her HEP and this therapist printed out her HEP again.   OBJECTIVE IMPAIRMENTS Abnormal gait, decreased activity tolerance, decreased balance, decreased mobility, difficulty walking, decreased ROM, decreased strength, improper body mechanics, and pain.   ACTIVITY LIMITATIONS lifting, bending, standing, squatting, stairs, transfers, and locomotion level  PARTICIPATION LIMITATIONS: meal prep, cleaning, laundry, shopping, community activity, and yard work  PERSONAL FACTORS Past/current experiences and Time since onset of injury/illness/exacerbation are also affecting patient's functional outcome.   REHAB POTENTIAL: Good  CLINICAL DECISION MAKING: Stable/uncomplicated  EVALUATION COMPLEXITY: Low   GOALS: SHORT TERM GOALS: Target date: 05/29/2022  Patient will be independent with initial HEP and self-management strategies to improve functional outcomes Baseline:  Goal status: MET  2. Patient will be able to stand, with minimal support, at least 5 minutes for improved ability to perform meal preparation/ cooking/ grooming/ cleaning ADLs.  Baseline: 2.5 minutes; 08/15/22 unable Goal status: IN PROGRESS  LONG TERM GOALS: Target date: 07/14/2022  Patient will be independent with advanced HEP and self-management strategies to improve functional outcomes Baseline:  Goal status: IN PROGRESS  2.  Patient will report at least 65% overall improvement in subjective complaint to indicate improvement in ability to perform ADLs. Baseline: 08/15/22 no progress per patient report Goal status: IN PROGRESS  3.  Patient will be able to stand, with minimal support, at least 10 minutes for improved ability to perform meal  preparation/ cooking/ grooming/ cleaning ADLs.  Baseline: 2.5 minutes Goal status: IN PROGRESS  4. Patient will have equal to or > 4+/5 MMT throughout BLE to improve ability to perform functional mobility, stair ambulation and ADLs.  Baseline: See MMT Goal status: IN PROGRESS  5. Patient will be able to ambulate at least 300 feet during 2MWT with LRAD to demonstrate improved ability to perform functional mobility and associated tasks. Baseline: 170 feet Goal status: IN PROGRESS  PLAN: PT FREQUENCY: 2x/week  PT DURATION: 4 more weeks  PLANNED INTERVENTIONS: Therapeutic exercises, Therapeutic activity, Neuromuscular re-education, Balance training, Gait training, Patient/Family education, Joint manipulation, Joint mobilization, Stair training, Aquatic Therapy, Dry Needling, Electrical stimulation, Spinal manipulation, Spinal mobilization, Cryotherapy, Moist heat, scar mobilization, Taping, Traction, Ultrasound, Biofeedback, Ionotophoresis 39m/ml Dexamethasone, and Manual therapy.  PLAN FOR NEXT SESSION: discharge  2:09 PM, 08/15/22 Jeannett Dekoning Small Adrieana Fennelly MPT [facility] [place] #[phone]

## 2022-08-19 ENCOUNTER — Inpatient Hospital Stay: Payer: 59

## 2022-08-19 ENCOUNTER — Other Ambulatory Visit (HOSPITAL_COMMUNITY): Payer: Self-pay | Admitting: Interventional Radiology

## 2022-08-19 ENCOUNTER — Ambulatory Visit
Admission: RE | Admit: 2022-08-19 | Discharge: 2022-08-19 | Disposition: A | Discharge status: Home / Self Care | Payer: 59 | Source: Ambulatory Visit | Attending: Gastroenterology | Admitting: Gastroenterology

## 2022-08-19 DIAGNOSIS — K649 Unspecified hemorrhoids: Secondary | ICD-10-CM

## 2022-08-19 DIAGNOSIS — K7460 Unspecified cirrhosis of liver: Secondary | ICD-10-CM | POA: Diagnosis not present

## 2022-08-19 DIAGNOSIS — K7581 Nonalcoholic steatohepatitis (NASH): Secondary | ICD-10-CM | POA: Diagnosis not present

## 2022-08-19 DIAGNOSIS — K7469 Other cirrhosis of liver: Secondary | ICD-10-CM | POA: Diagnosis not present

## 2022-08-19 DIAGNOSIS — R188 Other ascites: Secondary | ICD-10-CM | POA: Diagnosis not present

## 2022-08-19 MED ORDER — ALBUMIN HUMAN 25 % IV SOLN
INTRAVENOUS | Status: AC
  Administered 2022-08-19: 25 g via INTRAVENOUS
  Filled 2022-08-19: qty 200

## 2022-08-19 MED ORDER — LIDOCAINE HCL (PF) 1 % IJ SOLN
10.0000 mL | Freq: Once | INTRAMUSCULAR | Status: AC
  Administered 2022-08-19: 10 mL via INTRADERMAL

## 2022-08-19 MED ORDER — ALBUMIN HUMAN 25 % IV SOLN
25.0000 g | Freq: Once | INTRAVENOUS | Status: AC
  Administered 2022-08-19: 25 g via INTRAVENOUS

## 2022-08-19 MED ORDER — LIDOCAINE HCL (PF) 1 % IJ SOLN
9.0000 mL | Freq: Once | INTRAMUSCULAR | Status: AC
  Administered 2022-08-19: 9 mL via INTRADERMAL

## 2022-08-19 MED ORDER — HEPARIN SOD (PORK) LOCK FLUSH 100 UNIT/ML IV SOLN
INTRAVENOUS | Status: AC
  Filled 2022-08-19: qty 5

## 2022-08-19 MED ORDER — ALBUMIN HUMAN 25 % IV SOLN: 25.0000 g | Freq: Once | INTRAVENOUS | Status: AC

## 2022-08-19 NOTE — Procedures (Signed)
PROCEDURE SUMMARY:  Successful image-guided paracentesis from the right lower abdomen.  Yielded 8.0 liters of straw-colored fluid.  No immediate complications.  EBL = trace. Patient tolerated well.   Specimen was not sent for labs.  Please see imaging section of Epic for full dictation.   Lura Em PA-C 08/19/2022 3:59 PM

## 2022-08-19 NOTE — Discharge Instructions (Signed)
Discharge instructions reviewed with patient.

## 2022-08-20 ENCOUNTER — Ambulatory Visit: Payer: 59 | Admitting: Physician Assistant

## 2022-08-20 ENCOUNTER — Telehealth (HOSPITAL_COMMUNITY): Payer: Self-pay | Admitting: Radiology

## 2022-08-20 ENCOUNTER — Inpatient Hospital Stay: Payer: 59

## 2022-08-20 ENCOUNTER — Inpatient Hospital Stay: Payer: 59 | Attending: Hematology

## 2022-08-20 ENCOUNTER — Other Ambulatory Visit: Payer: 59

## 2022-08-20 VITALS — BP 120/50 | HR 71 | Temp 98.0°F | Resp 18

## 2022-08-20 DIAGNOSIS — D500 Iron deficiency anemia secondary to blood loss (chronic): Secondary | ICD-10-CM | POA: Diagnosis not present

## 2022-08-20 DIAGNOSIS — D696 Thrombocytopenia, unspecified: Secondary | ICD-10-CM | POA: Diagnosis not present

## 2022-08-20 DIAGNOSIS — E538 Deficiency of other specified B group vitamins: Secondary | ICD-10-CM | POA: Diagnosis not present

## 2022-08-20 DIAGNOSIS — E1122 Type 2 diabetes mellitus with diabetic chronic kidney disease: Secondary | ICD-10-CM | POA: Diagnosis not present

## 2022-08-20 DIAGNOSIS — Z95828 Presence of other vascular implants and grafts: Secondary | ICD-10-CM

## 2022-08-20 DIAGNOSIS — Z90710 Acquired absence of both cervix and uterus: Secondary | ICD-10-CM | POA: Diagnosis not present

## 2022-08-20 DIAGNOSIS — K7460 Unspecified cirrhosis of liver: Secondary | ICD-10-CM | POA: Insufficient documentation

## 2022-08-20 DIAGNOSIS — D631 Anemia in chronic kidney disease: Secondary | ICD-10-CM | POA: Insufficient documentation

## 2022-08-20 DIAGNOSIS — Z807 Family history of other malignant neoplasms of lymphoid, hematopoietic and related tissues: Secondary | ICD-10-CM | POA: Insufficient documentation

## 2022-08-20 DIAGNOSIS — D561 Beta thalassemia: Secondary | ICD-10-CM | POA: Diagnosis not present

## 2022-08-20 DIAGNOSIS — Z8051 Family history of malignant neoplasm of kidney: Secondary | ICD-10-CM | POA: Diagnosis not present

## 2022-08-20 DIAGNOSIS — R188 Other ascites: Secondary | ICD-10-CM | POA: Diagnosis not present

## 2022-08-20 DIAGNOSIS — N1832 Chronic kidney disease, stage 3b: Secondary | ICD-10-CM | POA: Diagnosis not present

## 2022-08-20 DIAGNOSIS — Z4689 Encounter for fitting and adjustment of other specified devices: Secondary | ICD-10-CM | POA: Diagnosis not present

## 2022-08-20 DIAGNOSIS — E559 Vitamin D deficiency, unspecified: Secondary | ICD-10-CM | POA: Insufficient documentation

## 2022-08-20 DIAGNOSIS — Z803 Family history of malignant neoplasm of breast: Secondary | ICD-10-CM | POA: Insufficient documentation

## 2022-08-20 DIAGNOSIS — Z01818 Encounter for other preprocedural examination: Secondary | ICD-10-CM | POA: Diagnosis not present

## 2022-08-20 DIAGNOSIS — D72819 Decreased white blood cell count, unspecified: Secondary | ICD-10-CM | POA: Insufficient documentation

## 2022-08-20 DIAGNOSIS — I129 Hypertensive chronic kidney disease with stage 1 through stage 4 chronic kidney disease, or unspecified chronic kidney disease: Secondary | ICD-10-CM | POA: Insufficient documentation

## 2022-08-20 DIAGNOSIS — Z8542 Personal history of malignant neoplasm of other parts of uterus: Secondary | ICD-10-CM | POA: Diagnosis not present

## 2022-08-20 LAB — CBC
HCT: 24 % — ABNORMAL LOW (ref 36.0–46.0)
Hemoglobin: 7.6 g/dL — ABNORMAL LOW (ref 12.0–15.0)
MCH: 24.9 pg — ABNORMAL LOW (ref 26.0–34.0)
MCHC: 31.7 g/dL (ref 30.0–36.0)
MCV: 78.7 fL — ABNORMAL LOW (ref 80.0–100.0)
Platelets: 101 10*3/uL — ABNORMAL LOW (ref 150–400)
RBC: 3.05 MIL/uL — ABNORMAL LOW (ref 3.87–5.11)
RDW: 21.6 % — ABNORMAL HIGH (ref 11.5–15.5)
WBC: 2.3 10*3/uL — ABNORMAL LOW (ref 4.0–10.5)
nRBC: 0 % (ref 0.0–0.2)

## 2022-08-20 LAB — SAMPLE TO BLOOD BANK

## 2022-08-20 MED ORDER — SODIUM CHLORIDE 0.9% FLUSH
10.0000 mL | INTRAVENOUS | Status: DC | PRN
  Administered 2022-08-20: 10 mL via INTRAVENOUS

## 2022-08-20 MED ORDER — HEPARIN SOD (PORK) LOCK FLUSH 100 UNIT/ML IV SOLN
500.0000 [IU] | Freq: Once | INTRAVENOUS | Status: AC
  Administered 2022-08-20: 500 [IU] via INTRAVENOUS

## 2022-08-20 NOTE — Progress Notes (Signed)
Patients port flushed without difficulty.  Good blood return noted with no bruising or swelling noted at site.  Band aid applied.  VSS with discharge and left in satisfactory condition with no s/s of distress noted.   

## 2022-08-20 NOTE — Telephone Encounter (Signed)
Called pt to schedule mesenteric angio with possible embo with Dr. Serafina Royals on 08/28/22. Pt states she has an appointment at [facility] that day and wants me to find a different day for this procedure to be done. Will reach out to the doctors schedulers for another possible date and call pt back. JM

## 2022-08-21 ENCOUNTER — Inpatient Hospital Stay: Payer: 59

## 2022-08-26 ENCOUNTER — Ambulatory Visit
Admission: RE | Admit: 2022-08-26 | Discharge: 2022-08-26 | Disposition: A | Discharge status: Home / Self Care | Payer: 59 | Source: Ambulatory Visit | Attending: Gastroenterology | Admitting: Gastroenterology

## 2022-08-26 ENCOUNTER — Other Ambulatory Visit: Payer: Self-pay | Admitting: Gastroenterology

## 2022-08-26 ENCOUNTER — Other Ambulatory Visit: Payer: 59

## 2022-08-26 ENCOUNTER — Ambulatory Visit: Payer: 59 | Admitting: Physician Assistant

## 2022-08-26 DIAGNOSIS — K7460 Unspecified cirrhosis of liver: Secondary | ICD-10-CM

## 2022-08-26 DIAGNOSIS — K7581 Nonalcoholic steatohepatitis (NASH): Secondary | ICD-10-CM | POA: Insufficient documentation

## 2022-08-26 DIAGNOSIS — R188 Other ascites: Secondary | ICD-10-CM | POA: Diagnosis not present

## 2022-08-26 DIAGNOSIS — K7469 Other cirrhosis of liver: Secondary | ICD-10-CM | POA: Diagnosis not present

## 2022-08-26 MED ORDER — ALBUMIN HUMAN 25 % IV SOLN
25.0000 g | Freq: Once | INTRAVENOUS | Status: AC
  Administered 2022-08-26: 25 g via INTRAVENOUS

## 2022-08-26 MED ORDER — HEPARIN SOD (PORK) LOCK FLUSH 100 UNIT/ML IV SOLN
INTRAVENOUS | Status: AC
  Filled 2022-08-26: qty 5

## 2022-08-26 MED ORDER — ALBUMIN HUMAN 25 % IV SOLN
INTRAVENOUS | Status: AC
  Filled 2022-08-26: qty 100

## 2022-08-26 MED ORDER — ALBUMIN HUMAN 25 % IV SOLN
INTRAVENOUS | Status: AC
  Administered 2022-08-26: 25 g via INTRAVENOUS
  Filled 2022-08-26: qty 100

## 2022-08-26 MED ORDER — ALBUMIN HUMAN 25 % IV SOLN: 25.0000 g | Freq: Once | INTRAVENOUS | Status: AC

## 2022-08-26 MED ORDER — LIDOCAINE HCL (PF) 1 % IJ SOLN
10.0000 mL | Freq: Once | INTRAMUSCULAR | Status: AC
  Administered 2022-08-26: 10 mL via INTRADERMAL
  Filled 2022-08-26: qty 10

## 2022-08-26 NOTE — Procedures (Signed)
PROCEDURE SUMMARY:  Successful US guided paracentesis from RUQ.  Yielded 8 L of clear, yellow fluid.  No immediate complications.  Pt tolerated well.   Specimen not sent for labs.  EBL < 1 mL  Tyson Alias, AGNP 08/26/2022 12:14 PM

## 2022-08-27 ENCOUNTER — Ambulatory Visit: Payer: 59 | Admitting: Physician Assistant

## 2022-08-27 ENCOUNTER — Other Ambulatory Visit: Payer: Self-pay | Admitting: Gastroenterology

## 2022-08-27 ENCOUNTER — Other Ambulatory Visit: Payer: 59

## 2022-08-27 ENCOUNTER — Inpatient Hospital Stay: Payer: 59

## 2022-08-27 ENCOUNTER — Ambulatory Visit
Admission: RE | Admit: 2022-08-27 | Discharge: 2022-08-27 | Disposition: A | Discharge status: Home / Self Care | Payer: 59 | Source: Ambulatory Visit | Attending: Internal Medicine | Admitting: Internal Medicine

## 2022-08-27 DIAGNOSIS — Z1231 Encounter for screening mammogram for malignant neoplasm of breast: Secondary | ICD-10-CM | POA: Insufficient documentation

## 2022-08-27 DIAGNOSIS — D500 Iron deficiency anemia secondary to blood loss (chronic): Secondary | ICD-10-CM | POA: Diagnosis not present

## 2022-08-27 DIAGNOSIS — K7460 Unspecified cirrhosis of liver: Secondary | ICD-10-CM

## 2022-08-27 LAB — CBC
HCT: 20.6 % — ABNORMAL LOW (ref 36.0–46.0)
Hemoglobin: 6.5 g/dL — CL (ref 12.0–15.0)
MCH: 24.8 pg — ABNORMAL LOW (ref 26.0–34.0)
MCHC: 31.6 g/dL (ref 30.0–36.0)
MCV: 78.6 fL — ABNORMAL LOW (ref 80.0–100.0)
Platelets: 120 10*3/uL — ABNORMAL LOW (ref 150–400)
RBC: 2.62 MIL/uL — ABNORMAL LOW (ref 3.87–5.11)
RDW: 22 % — ABNORMAL HIGH (ref 11.5–15.5)
WBC: 2.3 10*3/uL — ABNORMAL LOW (ref 4.0–10.5)
nRBC: 0 % (ref 0.0–0.2)

## 2022-08-27 LAB — PREPARE RBC (CROSSMATCH)

## 2022-08-27 NOTE — Addendum Note (Signed)
Addended by: Benjiman Core D on: 08/27/2022 01:22 PM   Modules accepted: Orders

## 2022-08-27 NOTE — Progress Notes (Signed)
CRITICAL VALUE ALERT Critical value received: HGB 6.5 Date of notification:  08-27-2022 Time of notification: 3462 pm Critical value read back:  Yes.   Nurse who received alert:  B.Emmanuelle Hibbitts RN  MD notified time and response:  Dr. Worthy Keeler at 1316 pm.

## 2022-08-28 ENCOUNTER — Inpatient Hospital Stay: Payer: 59

## 2022-08-28 DIAGNOSIS — D500 Iron deficiency anemia secondary to blood loss (chronic): Secondary | ICD-10-CM | POA: Diagnosis not present

## 2022-08-28 MED ORDER — SODIUM CHLORIDE 0.9% FLUSH
10.0000 mL | INTRAVENOUS | Status: AC | PRN
  Administered 2022-08-28: 10 mL

## 2022-08-28 MED ORDER — ACETAMINOPHEN 325 MG PO TABS
650.0000 mg | ORAL_TABLET | Freq: Once | ORAL | Status: DC
  Filled 2022-08-28: qty 2

## 2022-08-28 MED ORDER — DIPHENHYDRAMINE HCL 25 MG PO CAPS
25.0000 mg | ORAL_CAPSULE | Freq: Once | ORAL | Status: DC
  Filled 2022-08-28: qty 1

## 2022-08-28 MED ORDER — SODIUM CHLORIDE 0.9% IV SOLUTION
250.0000 mL | Freq: Once | INTRAVENOUS | Status: AC
  Administered 2022-08-28: 250 mL via INTRAVENOUS

## 2022-08-28 MED ORDER — HEPARIN SOD (PORK) LOCK FLUSH 100 UNIT/ML IV SOLN
500.0000 [IU] | Freq: Every day | INTRAVENOUS | Status: AC | PRN
  Administered 2022-08-28: 500 [IU]

## 2022-08-28 NOTE — Progress Notes (Signed)
2 units of blood given per orders. Patient tolerated it well without problems. Vitals stable and discharged home from clinic via wheelchair. Follow up as scheduled.

## 2022-08-28 NOTE — Patient Instructions (Signed)
[facility]  Discharge Instructions: Thank you for choosing [facility] to provide your oncology and hematology care.  If you have a lab appointment with the [facility], please come in thru the Main Entrance and check in at the main information desk.  Wear comfortable clothing and clothing appropriate for easy access to any Portacath or PICC line.   We strive to give you quality time with your provider. You may need to reschedule your appointment if you arrive late (15 or more minutes).  Arriving late affects you and other patients whose appointments are after yours.  Also, if you miss three or more appointments without notifying the office, you may be dismissed from the clinic at the provider's discretion.      For prescription refill requests, have your pharmacy contact our office and allow 72 hours for refills to be completed.    You received 2 units of blood today      To help prevent nausea and vomiting after your treatment, we encourage you to take your nausea medication as directed.  BELOW ARE SYMPTOMS THAT SHOULD BE REPORTED IMMEDIATELY: *FEVER GREATER THAN 100.4 F (38 C) OR HIGHER *CHILLS OR SWEATING *NAUSEA AND VOMITING THAT IS NOT CONTROLLED WITH YOUR NAUSEA MEDICATION *UNUSUAL SHORTNESS OF BREATH *UNUSUAL BRUISING OR BLEEDING *URINARY PROBLEMS (pain or burning when urinating, or frequent urination) *BOWEL PROBLEMS (unusual diarrhea, constipation, pain near the anus) TENDERNESS IN MOUTH AND THROAT WITH OR WITHOUT PRESENCE OF ULCERS (sore throat, sores in mouth, or a toothache) UNUSUAL RASH, SWELLING OR PAIN  UNUSUAL VAGINAL DISCHARGE OR ITCHING   Items with * indicate a potential emergency and should be followed up as soon as possible or go to the Emergency Department if any problems should occur.  Please show the CHEMOTHERAPY ALERT CARD or IMMUNOTHERAPY ALERT CARD at check-in to the Emergency Department and triage nurse.  Should you  have questions after your visit or need to cancel or reschedule your appointment, please contact [facility] [phone]  and follow the prompts.  Office hours are 8:00 a.m. to 4:30 p.m. Monday - Friday. Please note that voicemails left after 4:00 p.m. may not be returned until the following business day.  We are closed weekends and major holidays. You have access to a nurse at all times for urgent questions. Please call the main number to the clinic [phone] and follow the prompts.  For any non-urgent questions, you may also contact your provider using MyChart. We now offer e-Visits for anyone 38 and older to request care online for non-urgent symptoms. For details visit [email or web address].   Also download the MyChart app! Go to the app store, search "MyChart", open the app, select [facility], and log in with your MyChart username and password.

## 2022-08-29 LAB — BPAM RBC
Blood Product Expiration Date: 202402032359
Blood Product Expiration Date: 202402032359
ISSUE DATE / TIME: 202401111023
ISSUE DATE / TIME: 202401111153
Unit Type and Rh: 6200
Unit Type and Rh: 6200

## 2022-08-29 LAB — TYPE AND SCREEN
ABO/RH(D): A POS
Antibody Screen: NEGATIVE
Unit division: 0
Unit division: 0

## 2022-08-29 NOTE — Progress Notes (Unsigned)
[place], [place] [address]   CLINIC:  Medical Oncology/Hematology  PCP:  [name], MD [place] [address] [phone]   REASON FOR VISIT:  Follow-up for iron deficiency anemia and thalassemia    CURRENT THERAPY: Iron infusions and blood transfusions as needed  INTERVAL HISTORY:   Rachel Huber 67 y.o. 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  She was last seen by Tarri Abernethy PA-C on 05/28/2022.   At today's visit, she reports feeling fair.  Since her last visit, she was hospitalized from 06/17/2022 through 06/20/2022 for acute blood loss anemia.  She continues to receive regular paracentesis and frequent blood transfusions.  She had TIPS revision at [facility] on 08/20/2022.  She is scheduled for embolization of hemorrhoids this Friday, 09/05/2022.  She reports that her rectal bleeding is slightly improved after her most recent TIPS revision, but continues to "come and go," and is particularly worse right about the time she is due for paracentesis.  She continues to follow closely with gastroenterology (Dr. Haig Prophet) as well as hepatology/liver transplant at [facility].  No melena, epistaxis, hematemesis, or hematuria.  She continues to have significant leg swelling, which tends to be worse after her blood transfusions, although it has slightly improved since her most recent TIPS revision.  Chronic headaches stable at baseline.  No pica, restless legs, chest pain.  She continues to have orthopnea and intermittent dyspnea on exertion, particularly when she is due for paracentesis.  She has intermittent lightheadedness, no syncope.  She continues to take vitamin D 50,000 units weekly and daily vitamin B12 supplements.  She has 50% energy and 100% appetite. She endorses that she is maintaining a  stable weight.   ASSESSMENT & PLAN:  1.  Iron deficiency anemia - Suspected cause of anemia is due to chronic blood loss and malabsorption, as well as thalassemia (see below) - Colonoscopies in February 2022 & April 2022 showed grade III/IV nonbleeding internal hemorrhoids, polyps, and area of significantly congested mucosa - EGD in February 2022 & April 2022 showed portal hypertensive gastropathy and erythematous duodenopathy - She has had frequent hospitalizations and blood transfusions over the past year, with notable events recorded below:  Flexible sigmoidoscopy on 02/06/2022 showed prolapsed hemorrhoids and internal hemorrhoids.  Interventional radiology performed bilateral superior rectal artery particle and coil embolization on 02/07/2022. - There was some concern for possible hemolysis due to findings of low haptoglobin, elevated reticulocytes (5.6%), and elevated bilirubin (total bili 3.0, direct bili 1.0, indirect bili 2.0).  Discussed extensively with Dr. Delton Coombes, who felt that she may have some underlying degree of hemolysis from her beta thalassemia intermedia, although low haptoglobin may also be from cirrhosis with high reticulocytes from acute blood loss anemia.   - During hospitalizations, patient has developed recurrent AKI's with evolving CKD - Most recent hematology panel (08/13/2022): Ferritin 440, iron saturation 59% Creatinine 1.70/GFR 33 Previous labs showed normal copper, folate, and SPEP. - Most recent CBC (08/27/2022): Hgb 6.5/MCV 78.6, WBC 2.3, platelets 120 - Intermittent rectal bleeding, which is at times severe - Goal is to keep ferritin level around 200 (due to chronic GI blood loss and comorbid CKD); goal Hgb is 9.0-10.0 due to patient's underlying thalassemia.  Transfusion threshold is < 7.0. - PLAN: No IV iron at this time.   - Weekly CBC + BB  sample with possible transfusion    - Due to developing CKD, we we will start patient on ESA with Retacrit 10,000  units weekly - Goal is to keep ferritin level around 200 (due to chronic GI blood loss and comorbid CKD); goal Hgb is 9.0-10.0 due to patient's underlying thalassemia.  Transfusion threshold is < 7.0. - CBC/D and office visit in 4 weeks    2.  Thrombocytopenia & leukopenia - Intermittent leukopenia since January 2022 (intermittently neutropenia and lymphopenia) - Moderate thrombocytopenia since January 2022, with baseline platelets around 100-150 - CTA abdomen/pelvis (02/05/2022) shows splenomegaly at 17.3 cm - PLAN: Differential diagnosis favors thrombocytopenia and leukopenia secondary to splenic sequestration and cirrhosis.  We will continue to monitor closely and consider additional workup such as bone marrow biopsy if any major deviations from baseline that are out of proportion to overall clinical picture  3.  Vitamin B12 deficiency - Noted on 06/24/2021 to have normal vitamin B12 457, but with elevated methylmalonic acid 496 - Most recent labs (08/13/2022) with B12 723 and methylmalonic acid 563 - She is taking vitamin B12 supplement 500 mcg daily   - PLAN: Continue vitamin B12 500 mcg daily  - We will recheck B12/methylmalonic acid at follow-up visit in about 6 months (July 2024)   4.  Beta thalassemia intermedia - She reports that she has always been anemic, but was not diagnosed with thalassemia until around age 35.  Her father, sister, and brother all have thalassemia. - Her hemoglobin has been as low as 4.6, and she has received  only 1 blood transfusion in the past related to her rectal bleeding.  She has not required regular transfusion for her thalassemia. 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  Discussed extensively with Dr. Delton Coombes, who felt that she may have some underlying degree of hemolysis from her beta thalassemia intermedia, although low haptoglobin may also be from cirrhosis with high reticulocytes from acute blood loss anemia.   - Her highest hemoglobin is around 9-10, but her baseline hemoglobin usually runs from 7.0-8.0 - PLAN: We will transfuse as needed if < 7.0 or severely symptomatic from anemia.     5.  Vitamin D deficiency - Vitamin D deficiency noted at 19.97 (06/24/2021) - Taking vitamin D 50,000 units weekly since October 2023 - Most recent vitamin D (08/13/2022) improved at 49.25 - PLAN: We will DECREASE vitamin D 50,000 units to every other week dosing.  Will recheck vitamin D in 6 months (July 2024)  6.  History of uterine cancer - Diagnosed in 2010, she has total hysterectomy and bilateral oophorectomy - She did not require any chemotherapy or radiation - She was treated in [place], [place] - Her last CT imaging of her abdomen and pelvis showed no evidence of cancer recurrence   7.  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     PLAN SUMMARY: >> Weekly CBC/BB sample + possible transfusion + WEEKLY RETACRIT (new) >> Same-day labs (CBC/D) + injection + OFFICE visit in 4 to 6 weeks    REVIEW OF SYSTEMS:   Review of Systems  Constitutional:  Positive for fatigue. Negative for appetite change, chills, diaphoresis, fever and unexpected weight  change.  HENT:   Negative for lump/mass and nosebleeds.   Eyes:  Negative for eye problems.  Respiratory:  Positive for cough and shortness of breath. Negative for hemoptysis.   Cardiovascular:  Positive for leg swelling. Negative for chest pain and palpitations.  Gastrointestinal:  Positive for abdominal distention, blood in stool and constipation. Negative for abdominal pain, diarrhea, nausea and vomiting.  Genitourinary:  Negative for hematuria.   Musculoskeletal:  Positive for arthralgias (right shoulder).  Skin: Negative.   Neurological:  Positive for dizziness, headaches and light-headedness.  Hematological:  Does not bruise/bleed easily.     PHYSICAL EXAM:  ECOG PERFORMANCE STATUS: 3 - Symptomatic, >50% confined to bed  There were no vitals filed for this visit. There were no vitals filed for this visit. Physical Exam Constitutional:      Appearance: Normal appearance. She is obese.  HENT:     Head: Normocephalic and atraumatic.     Mouth/Throat:     Mouth: Mucous membranes are moist.  Eyes:     Extraocular Movements: Extraocular movements intact.     Pupils: Pupils are equal, round, and reactive to light.  Cardiovascular:     Rate and Rhythm: Normal rate and regular rhythm.     Pulses: Normal pulses.     Heart sounds: Normal heart sounds.  Pulmonary:     Effort: Pulmonary effort is normal.     Breath sounds: Rales (Faint bibasilar crackles) present.  Abdominal:     General: Bowel sounds are normal. There is distension.     Palpations: Abdomen is soft.     Tenderness: There is no abdominal tenderness.  Musculoskeletal:        General: No swelling.     Right lower leg: Edema (3+ pretibial edema, with pitting edema extending to hips) present.     Left lower leg: Edema (3+ pretibial edema, with pitting edema extending to hips) present.  Lymphadenopathy:     Cervical: No cervical adenopathy.  Skin:    General: Skin is warm and dry.  Neurological:     General: No  focal deficit present.     Mental Status: She is alert and oriented to person, place, and time.  Psychiatric:        Mood and Affect: Mood normal.        Behavior: Behavior normal.     PAST MEDICAL/SURGICAL HISTORY:  Past Medical History:  Diagnosis Date   Anxiety    Arthritis    Cirrhosis of liver ([place])    Diabetes mellitus without complication (HCC)    Dyspnea    DOE   GERD (gastroesophageal reflux disease)    Grade IV internal hemorrhoids    Contingency plan for any future admissions for severe anemia in the setting of persistent GI bleed:  nuclear medicine tagged RBC scan to assess for location of GI bleeding - if from rectum, would then proceed to angiogram with possible repeat embolization.  Please notify IR in this case.   Hepatitis    PAST   Hypertension    Neuropathy    Neuropathy, diabetic ([place])    Pneumonia    Sinus  complaint    Sleep apnea    CPAP   Thalassemia minor 1992   Past Surgical History:  Procedure Laterality Date   ABDOMINAL HYSTERECTOMY     BREAST BIOPSY Right 02/15/2018   [place] bx 6-6:30 ribbon shape, ONE CORE FRAGMENT WITH FIBROSIS. ONE CORE FRAGMENT WITH PORTION OF A DILATED   BREAST BIOPSY Right 02/15/2018   [place] bx 9:00 heart shape, USUAL DUCTAL HYPERPLASIA   BREAST LUMPECTOMY Right 03/09/2018   Procedure: BREAST LUMPECTOMY x 2;  Surgeon: Benjamine Sprague, DO;  Location: [facility] ORS;  Service: General;  Laterality: Right;   CATARACT EXTRACTION W/PHACO Left 06/11/2016   Procedure: CATARACT EXTRACTION PHACO AND INTRAOCULAR LENS PLACEMENT (IOC);  Surgeon: Estill Cotta, MD;  Location: [facility] ORS;  Service: Ophthalmology;  Laterality: Left;  Lot # [id number] H US:01:38.6 AP%:26.4 CDE:44.15   CATARACT EXTRACTION W/PHACO Right 06/15/2018   Procedure: CATARACT EXTRACTION PHACO AND INTRAOCULAR LENS PLACEMENT (IOC);  Surgeon: Birder Robson, MD;  Location: [facility] ORS;  Service: Ophthalmology;  Laterality: Right;  [place] 00:38.2 CDE 4.23 Fluid Pack Lot # [id number]    COLONOSCOPIES     COLONOSCOPY WITH PROPOFOL N/A 10/10/2020   Procedure: COLONOSCOPY WITH PROPOFOL;  Surgeon: Lesly Rubenstein, MD;  Location: [facility] ENDOSCOPY;  Service: Endoscopy;  Laterality: N/A;   COLONOSCOPY WITH PROPOFOL N/A 11/20/2020   Procedure: COLONOSCOPY WITH PROPOFOL;  Surgeon: Lesly Rubenstein, MD;  Location: [facility] ENDOSCOPY;  Service: Endoscopy;  Laterality: N/A;  DM STAT CBC, BMP COVID POSITIVE 09/02/2020   COLONOSCOPY WITH PROPOFOL N/A 06/19/2022   Procedure: COLONOSCOPY WITH PROPOFOL;  Surgeon: Lesly Rubenstein, MD;  Location: [facility] ENDOSCOPY;  Service: Endoscopy;  Laterality: N/A;   CTR     ESOPHAGOGASTRODUODENOSCOPY (EGD) WITH PROPOFOL N/A 10/10/2020   Procedure: ESOPHAGOGASTRODUODENOSCOPY (EGD) WITH PROPOFOL;  Surgeon: Lesly Rubenstein, MD;  Location: [facility] ENDOSCOPY;  Service: Endoscopy;  Laterality: N/A;  COVID POSITIVE 10/08/2020   FLEXIBLE SIGMOIDOSCOPY N/A 02/06/2022   Procedure: FLEXIBLE SIGMOIDOSCOPY;  Surgeon: Lesly Rubenstein, MD;  Location: [facility] ENDOSCOPY;  Service: Endoscopy;  Laterality: N/A;  Patient requests anesthesia   IR ANGIOGRAM SELECTIVE EACH ADDITIONAL VESSEL  02/07/2022   IR ANGIOGRAM SELECTIVE EACH ADDITIONAL VESSEL  02/07/2022   IR ANGIOGRAM VISCERAL SELECTIVE  02/07/2022   IR EMBO ARTERIAL NOT HEMORR HEMANG INC GUIDE ROADMAPPING  02/07/2022   IR IMAGING GUIDED PORT INSERTION  06/13/2022   IR PARACENTESIS  12/17/2021   IR PARACENTESIS  03/25/2022   IR RADIOLOGIST EVAL & MGMT  03/18/2022   IR RADIOLOGIST EVAL & MGMT  05/07/2022   IR RADIOLOGIST EVAL & MGMT  05/16/2022   IR US GUIDE VASC ACCESS RIGHT  02/07/2022   JOINT REPLACEMENT     KNEE SURGERY Right 09/24/2017   plates and pins   TOTAL SHOULDER ARTHROPLASTY Right 09/27/2015    SOCIAL HISTORY:  Social History   Socioeconomic History   Marital status: Married    Spouse name: Not on file   Number of children: 0   Years of education: Not on file   Highest education level: Not on file   Occupational History   Occupation: EMPLOYED  Tobacco Use   Smoking status: Never   Smokeless tobacco: Never  Vaping Use   Vaping Use: Never used  Substance and Sexual Activity   Alcohol use: No   Drug use: Never   Sexual activity: Not Currently  Other Topics Concern   Not on file  Social History Narrative   Not on file   Social Determinants of Health  Financial Resource Strain: Low Risk  (04/04/2022)   Overall Financial Resource Strain (CARDIA)    Difficulty of Paying Living Expenses: Not hard at all  Food Insecurity: No Food Insecurity (06/18/2022)   Hunger Vital Sign    Worried About Running Out of Food in the Last Year: Never true    Ran Out of Food in the Last Year: Never true  Transportation Needs: No Transportation Needs (06/18/2022)   PRAPARE - Hydrologist (Medical): No    Lack of Transportation (Non-Medical): No  Physical Activity: Inactive (04/04/2022)   Exercise Vital Sign    Days of Exercise per Week: 0 days    Minutes of Exercise per Session: 0 min  Stress: No Stress Concern Present (04/04/2022)   [place]    Feeling of Stress : Only a little  Social Connections: Moderately Isolated (04/04/2022)   Social Connection and Isolation Panel [NHANES]    Frequency of Communication with Friends and Family: More than three times a week    Frequency of Social Gatherings with Friends and Family: Three times a week    Attends Religious Services: Never    Active Member of Clubs or Organizations: No    Attends Archivist Meetings: Never    Marital Status: Married  Human resources officer Violence: Not At Risk (06/18/2022)   Humiliation, Afraid, Rape, and Kick questionnaire    Fear of Current or Ex-Partner: No    Emotionally Abused: No    Physically Abused: No    Sexually Abused: No    FAMILY HISTORY:  Family History  Problem Relation Age of Onset   Breast cancer Mother  74   Lymphoma Mother    Diabetes Father    Kidney cancer Father    Heart disease Father    Diabetes Sister    Breast cancer Sister 2    CURRENT MEDICATIONS:  Outpatient Encounter Medications as of 09/01/2022  Medication Sig   acetaminophen (TYLENOL) 650 MG CR tablet Take 1,300 mg by mouth daily as needed for pain.   albuterol (VENTOLIN HFA) 108 (90 Base) MCG/ACT inhaler Inhale 2 puffs into the lungs every 6 (six) hours as needed for wheezing or shortness of breath.   atorvastatin (LIPITOR) 10 MG tablet Take 1 tablet (10 mg total) by mouth daily.   diphenhydrAMINE (BENADRYL) 25 MG tablet Take 25 mg by mouth 2 (two) times daily as needed for itching.   ergocalciferol (VITAMIN D2) 1.25 MG (50000 UT) capsule Take 1 capsule (50,000 Units total) by mouth once a week.   furosemide (LASIX) 40 MG tablet Take 40 mg by mouth daily.   gabapentin (NEURONTIN) 400 MG capsule Take 800 mg by mouth 2 (two) times daily.   hydrocortisone (ANUCORT-HC) 25 MG suppository Place 1 suppository (25 mg total) rectally 2 (two) times daily as needed for anal itching or hemorrhoids.   hydrocortisone (ANUSOL-HC) 2.5 % rectal cream Place rectally 4 (four) times daily.   lactulose, encephalopathy, (CHRONULAC) 10 GM/15ML SOLN Take 30 mLs (20 g total) by mouth daily.   lidocaine-prilocaine (EMLA) cream Apply 1 Application topically See admin instructions for 1 dose. Apply to port site 1 hour prior to procedure   melatonin 5 MG TABS Take 10 mg by mouth at bedtime as needed (sleep).   Multiple Vitamins-Minerals (MULTIVITAMIN WITH MINERALS) tablet Take 1 tablet by mouth daily.   polyethylene glycol (MIRALAX / GLYCOLAX) 17 g packet Take 17 g by mouth 2 (two)  times daily.   sertraline (ZOLOFT) 50 MG tablet Take 50 mg by mouth daily.   sertraline (ZOLOFT) 50 MG tablet Take 1 tablet by mouth daily.   vitamin B-12 (CYANOCOBALAMIN) 1000 MCG tablet Take 1,000 mcg by mouth daily.   witch hazel-glycerin (TUCKS) pad Apply topically as  needed for hemorrhoids.   Facility-Administered Encounter Medications as of 09/01/2022  Medication   diphenhydrAMINE (BENADRYL) capsule 25 mg    ALLERGIES:  Allergies  Allergen Reactions   Gramineae Pollens Other (See Comments)    Sneezing, Running nose   Latex Rash    Contact rash    LABORATORY DATA:  I have reviewed the labs as listed.  CBC    Component Value Date/Time   WBC 2.3 (L) 08/27/2022 1208   RBC 2.62 (L) 08/27/2022 1208   HGB 6.5 (LL) 08/27/2022 1208   HCT 20.6 (L) 08/27/2022 1208   PLT 120 (L) 08/27/2022 1208   MCV 78.6 (L) 08/27/2022 1208   MCH 24.8 (L) 08/27/2022 1208   MCHC 31.6 08/27/2022 1208   RDW 22.0 (H) 08/27/2022 1208   LYMPHSABS 1.0 08/13/2022 1337   MONOABS 0.3 08/13/2022 1337   EOSABS 0.2 08/13/2022 1337   BASOSABS 0.0 08/13/2022 1337      Latest Ref Rng & Units 08/13/2022    1:37 PM 07/01/2022    2:49 PM 06/20/2022    6:26 AM  CMP  Glucose 70 - 99 mg/dL 179  107  161   BUN 8 - 23 mg/dL 36  36  33   Creatinine 0.44 - 1.00 mg/dL 1.70  1.50  1.56   Sodium 135 - 145 mmol/L 137  139  142   Potassium 3.5 - 5.1 mmol/L 4.1  4.5  3.3   Chloride 98 - 111 mmol/L 108  113  115   CO2 22 - 32 mmol/L '23  21  22   '$ Calcium 8.9 - 10.3 mg/dL 8.0  8.5  8.1   Total Protein 6.5 - 8.1 g/dL 4.8  4.7  3.8   Total Bilirubin 0.3 - 1.2 mg/dL 2.1  2.1  1.6   Alkaline Phos 38 - 126 U/L 108  79  84   AST 15 - 41 U/L 52  40  44   ALT 0 - 44 U/L '26  23  22     '$ DIAGNOSTIC IMAGING:  I have independently reviewed the relevant imaging and discussed with the patient.   WRAP UP:  All questions were answered. The patient knows to call the clinic with any problems, questions or concerns.  Medical decision making: Moderate  Time spent on visit: I spent 25 minutes counseling the patient face to face. The total time spent in the appointment was 40 minutes and more than 50% was on counseling.  Harriett Rush, PA-C  09/01/2022 10:42 PM

## 2022-09-01 ENCOUNTER — Other Ambulatory Visit: Payer: Self-pay | Admitting: Physician Assistant

## 2022-09-01 ENCOUNTER — Encounter: Payer: Self-pay | Admitting: Physician Assistant

## 2022-09-01 ENCOUNTER — Inpatient Hospital Stay: Payer: 59 | Admitting: Physician Assistant

## 2022-09-01 VITALS — BP 121/53 | HR 78 | Temp 97.4°F | Resp 18 | Ht 69.0 in | Wt 304.2 lb

## 2022-09-01 DIAGNOSIS — D500 Iron deficiency anemia secondary to blood loss (chronic): Secondary | ICD-10-CM

## 2022-09-01 DIAGNOSIS — D561 Beta thalassemia: Secondary | ICD-10-CM

## 2022-09-01 DIAGNOSIS — E559 Vitamin D deficiency, unspecified: Secondary | ICD-10-CM

## 2022-09-01 DIAGNOSIS — N1832 Chronic kidney disease, stage 3b: Secondary | ICD-10-CM

## 2022-09-01 DIAGNOSIS — D61818 Other pancytopenia: Secondary | ICD-10-CM

## 2022-09-01 DIAGNOSIS — D631 Anemia in chronic kidney disease: Secondary | ICD-10-CM

## 2022-09-01 DIAGNOSIS — E538 Deficiency of other specified B group vitamins: Secondary | ICD-10-CM | POA: Diagnosis not present

## 2022-09-01 MED ORDER — ERGOCALCIFEROL 1.25 MG (50000 UT) PO CAPS: 50000.0000 [IU] | ORAL_CAPSULE | ORAL | 1 refills | Status: DC

## 2022-09-01 NOTE — Patient Instructions (Signed)
[facility] [place] **   You were seen today by Tarri Abernethy PA-C for your iron deficiency anemia.    IRON DEFICIENCY ANEMIA & THALASSEMIA: Your anemia is related to your thalassemia, chronic blood loss, PLUS your developing chronic kidney disease.  bl anemia ood levels have improved, and you are within your baseline range of hemoglobin with your underlying thalassemia.  You do not need any IV iron at this time. We will start you on RETACRIT injections once a week to see if this will help to improve your anemia of chronic kidney disease. You do not need any IV iron at this time. We will continue to check your blood once a week and give blood transfusion if you have hemoglobin <7.0  VITAMIN B-12 DEFICIENCY Continue taking vitamin B12 500 mcg daily  VITAMIN D DEFICIENCY DECREASE dose of vitamin D to 50,000 units EVERY OTHER WEEK.   FOLLOW-UP APPOINTMENT: Same-day labs and office visit in 6 weeks  ** Thank you for trusting me with your healthcare!  I strive to provide all of my patients with quality care at each visit.  If you receive a survey for this visit, I would be so grateful to you for taking the time to provide feedback.  Thank you in advance!  ~ Shaleta Ruacho                   Dr. Derek Jack   &   Tarri Abernethy, PA-C   - - - - - - - - - - - - - - - - - -    Thank you for choosing [place] [facility] to provide your oncology and hematology care.  To afford each patient quality time with our provider, please arrive at least 15 minutes before your scheduled appointment time.   If you have a lab appointment with the [facility] please come in thru the Main Entrance and check in at the main information desk.  You need to re-schedule your appointment should you arrive 10 or more minutes late.  We strive to give you quality time with our providers, and arriving late  affects you and other patients whose appointments are after yours.  Also, if you no show three or more times for appointments you may be dismissed from the clinic at the providers discretion.     Again, thank you for choosing [facility].  Our hope is that these requests will decrease the amount of time that you wait before being seen by our physicians.       _____________________________________________________________  Should you have questions after your visit to [facility], please contact our office at [phone] and follow the prompts.  Our office hours are 8:00 a.m. and 4:30 p.m. Monday - Friday.  Please note that voicemails left after 4:00 p.m. may not be returned until the following business day.  We are closed weekends and major holidays.  You do have access to a nurse 24-7, just call the main number to the clinic [phone] and do not press any options, hold on the line and a nurse will answer the phone.    For prescription refill requests, have your pharmacy contact our office and allow 72 hours.

## 2022-09-02 ENCOUNTER — Ambulatory Visit
Admission: RE | Admit: 2022-09-02 | Discharge: 2022-09-02 | Disposition: A | Discharge status: Home / Self Care | Payer: 59 | Source: Ambulatory Visit | Attending: Gastroenterology | Admitting: Gastroenterology

## 2022-09-02 ENCOUNTER — Other Ambulatory Visit: Payer: Self-pay

## 2022-09-02 DIAGNOSIS — R188 Other ascites: Secondary | ICD-10-CM | POA: Diagnosis not present

## 2022-09-02 DIAGNOSIS — K7460 Unspecified cirrhosis of liver: Secondary | ICD-10-CM | POA: Diagnosis not present

## 2022-09-02 DIAGNOSIS — D61818 Other pancytopenia: Secondary | ICD-10-CM

## 2022-09-02 DIAGNOSIS — D561 Beta thalassemia: Secondary | ICD-10-CM

## 2022-09-02 DIAGNOSIS — K7581 Nonalcoholic steatohepatitis (NASH): Secondary | ICD-10-CM | POA: Diagnosis not present

## 2022-09-02 DIAGNOSIS — N1832 Chronic kidney disease, stage 3b: Secondary | ICD-10-CM

## 2022-09-02 DIAGNOSIS — D631 Anemia in chronic kidney disease: Secondary | ICD-10-CM

## 2022-09-02 DIAGNOSIS — D500 Iron deficiency anemia secondary to blood loss (chronic): Secondary | ICD-10-CM

## 2022-09-02 MED ORDER — ALBUMIN HUMAN 25 % IV SOLN: 25.0000 g | Freq: Once | INTRAVENOUS | Status: DC

## 2022-09-02 MED ORDER — LIDOCAINE HCL (PF) 1 % IJ SOLN
10.0000 mL | Freq: Once | INTRAMUSCULAR | Status: AC
  Administered 2022-09-02: 10 mL via INTRADERMAL
  Filled 2022-09-02: qty 10

## 2022-09-02 MED ORDER — ALBUMIN HUMAN 25 % IV SOLN
INTRAVENOUS | Status: AC
  Filled 2022-09-02: qty 100

## 2022-09-02 NOTE — Progress Notes (Signed)
Insurance now requires Procrit.  Orders updated.  Henreitta Leber, PharmD 09/02/2022 @ [phone]

## 2022-09-02 NOTE — Procedures (Signed)
PROCEDURE SUMMARY:  Successful US guided therapeutic paracentesis from RLQ.  Yielded 408 L of clear, yellow fluid.  No immediate complications.  Pt tolerated well.   Specimen not sent for labs.  EBL < 1 mL  Tyson Alias, AGNP 09/02/2022 1:43 PM

## 2022-09-03 ENCOUNTER — Inpatient Hospital Stay: Payer: 59

## 2022-09-03 ENCOUNTER — Other Ambulatory Visit: Payer: Self-pay | Admitting: Radiology

## 2022-09-03 ENCOUNTER — Other Ambulatory Visit: Payer: 59

## 2022-09-03 VITALS — BP 117/47 | HR 74 | Temp 98.5°F | Resp 20

## 2022-09-03 DIAGNOSIS — Z95828 Presence of other vascular implants and grafts: Secondary | ICD-10-CM

## 2022-09-03 DIAGNOSIS — D500 Iron deficiency anemia secondary to blood loss (chronic): Secondary | ICD-10-CM | POA: Diagnosis not present

## 2022-09-03 DIAGNOSIS — K649 Unspecified hemorrhoids: Secondary | ICD-10-CM

## 2022-09-03 LAB — CBC
HCT: 22.1 % — ABNORMAL LOW (ref 36.0–46.0)
Hemoglobin: 7.1 g/dL — ABNORMAL LOW (ref 12.0–15.0)
MCH: 25.7 pg — ABNORMAL LOW (ref 26.0–34.0)
MCHC: 32.1 g/dL (ref 30.0–36.0)
MCV: 80.1 fL (ref 80.0–100.0)
Platelets: 105 10*3/uL — ABNORMAL LOW (ref 150–400)
RBC: 2.76 MIL/uL — ABNORMAL LOW (ref 3.87–5.11)
RDW: 21.8 % — ABNORMAL HIGH (ref 11.5–15.5)
WBC: 1.9 10*3/uL — ABNORMAL LOW (ref 4.0–10.5)
nRBC: 0 % (ref 0.0–0.2)

## 2022-09-03 LAB — SAMPLE TO BLOOD BANK

## 2022-09-03 MED ORDER — HEPARIN SOD (PORK) LOCK FLUSH 100 UNIT/ML IV SOLN
500.0000 [IU] | Freq: Once | INTRAVENOUS | Status: AC
  Administered 2022-09-03: 500 [IU] via INTRAVENOUS

## 2022-09-03 MED ORDER — SODIUM CHLORIDE 0.9% FLUSH
10.0000 mL | INTRAVENOUS | Status: DC | PRN
  Administered 2022-09-03: 10 mL via INTRAVENOUS

## 2022-09-03 NOTE — Progress Notes (Signed)
Called patient to let her know her hemoglobin is 7.1 today and she will not need transfusion per Tarri Abernethy PA-C.

## 2022-09-03 NOTE — Progress Notes (Signed)
Patients port flushed without difficulty.  Good blood return noted with no bruising or swelling noted at site.  Band aid applied.  VSS with discharge and left in satisfactory condition with no s/s of distress noted.   

## 2022-09-04 ENCOUNTER — Other Ambulatory Visit: Payer: Self-pay | Admitting: Student

## 2022-09-04 NOTE — H&P (Signed)
Chief Complaint: Ongoing internal hemorrhoidal bleeding requiring blood transfusion. Patient presents for angiogram with possible embolization.  Supervising Physician: Ruthann Cancer  Patient Status: [facility] - Out-pt  History of Present Illness: Rachel Huber is a 67 y.o. female outpatient. Known to IR. History of NASH cirrhosis with recurrent ascites, s/p TIPS in Dec 26, 2021 and TIPS revision March 12, 2022  and January 4th 2024 [facility].  DM,GERD, uterine cancer, HTN, HLD,  iron deficiency anemia and thalassemia. Grade IV internal hemorrhoids with ongoing hemorrhoidal bleeding requiring frequent transfusions despite s/p bilateral superior rectal artery particle and coil embolization in February 10, 2022 by Dr. Serafina Royals. Patient presents for  angiogram with possible embolization for ongoing rectal bleeding.  Currently without any significant complaints. Patient alert and laying in bed,calm. Denies any fevers, headache, chest pain, SOB, cough, abdominal pain, nausea, vomiting or bleeding. Return precautions and treatment recommendations and follow-up discussed with the patient  who is agreeable with the plan.   Past Medical History:  Diagnosis Date   Anemia in chronic kidney disease (CKD) 02/07/2022   Anxiety    Arthritis    Cirrhosis of liver ([place])    Diabetes mellitus without complication (HCC)    Dyspnea    DOE   GERD (gastroesophageal reflux disease)    Grade IV internal hemorrhoids    Contingency plan for any future admissions for severe anemia in the setting of persistent GI bleed:  nuclear medicine tagged RBC scan to assess for location of GI bleeding - if from rectum, would then proceed to angiogram with possible repeat embolization.  Please notify IR in this case.   Hepatitis    PAST   Hypertension    Neuropathy    Neuropathy, diabetic ([place])    Pneumonia    Sinus complaint    Sleep apnea    CPAP   Thalassemia minor 1992    Past Surgical History:  Procedure Laterality Date    ABDOMINAL HYSTERECTOMY     BREAST BIOPSY Right 02/15/2018   [place] bx 6-6:30 ribbon shape, ONE CORE FRAGMENT WITH FIBROSIS. ONE CORE FRAGMENT WITH PORTION OF A DILATED   BREAST BIOPSY Right 02/15/2018   [place] bx 9:00 heart shape, USUAL DUCTAL HYPERPLASIA   BREAST LUMPECTOMY Right 03/09/2018   Procedure: BREAST LUMPECTOMY x 2;  Surgeon: Benjamine Sprague, DO;  Location: [facility] ORS;  Service: General;  Laterality: Right;   CATARACT EXTRACTION W/PHACO Left 06/11/2016   Procedure: CATARACT EXTRACTION PHACO AND INTRAOCULAR LENS PLACEMENT (IOC);  Surgeon: Estill Cotta, MD;  Location: [facility] ORS;  Service: Ophthalmology;  Laterality: Left;  Lot # [id number] H US:01:38.6 AP%:26.4 CDE:44.15   CATARACT EXTRACTION W/PHACO Right 06/15/2018   Procedure: CATARACT EXTRACTION PHACO AND INTRAOCULAR LENS PLACEMENT (IOC);  Surgeon: Birder Robson, MD;  Location: [facility] ORS;  Service: Ophthalmology;  Laterality: Right;  [place] 00:38.2 CDE 4.23 Fluid Pack Lot # [id number]   COLONOSCOPIES     COLONOSCOPY WITH PROPOFOL N/A 10/10/2020   Procedure: COLONOSCOPY WITH PROPOFOL;  Surgeon: Lesly Rubenstein, MD;  Location: [facility] ENDOSCOPY;  Service: Endoscopy;  Laterality: N/A;   COLONOSCOPY WITH PROPOFOL N/A 11/20/2020   Procedure: COLONOSCOPY WITH PROPOFOL;  Surgeon: Lesly Rubenstein, MD;  Location: [facility] ENDOSCOPY;  Service: Endoscopy;  Laterality: N/A;  DM STAT CBC, BMP COVID POSITIVE 09/02/2020   COLONOSCOPY WITH PROPOFOL N/A 06/19/2022   Procedure: COLONOSCOPY WITH PROPOFOL;  Surgeon: Lesly Rubenstein, MD;  Location: [facility] ENDOSCOPY;  Service: Endoscopy;  Laterality: N/A;   CTR     ESOPHAGOGASTRODUODENOSCOPY (  EGD) WITH PROPOFOL N/A 10/10/2020   Procedure: ESOPHAGOGASTRODUODENOSCOPY (EGD) WITH PROPOFOL;  Surgeon: Lesly Rubenstein, MD;  Location: [facility] ENDOSCOPY;  Service: Endoscopy;  Laterality: N/A;  COVID POSITIVE 10/08/2020   FLEXIBLE SIGMOIDOSCOPY N/A 02/06/2022   Procedure: FLEXIBLE SIGMOIDOSCOPY;  Surgeon: Lesly Rubenstein,  MD;  Location: [facility] ENDOSCOPY;  Service: Endoscopy;  Laterality: N/A;  Patient requests anesthesia   IR ANGIOGRAM SELECTIVE EACH ADDITIONAL VESSEL  02/07/2022   IR ANGIOGRAM SELECTIVE EACH ADDITIONAL VESSEL  02/07/2022   IR ANGIOGRAM VISCERAL SELECTIVE  02/07/2022   IR EMBO ARTERIAL NOT HEMORR HEMANG INC GUIDE ROADMAPPING  02/07/2022   IR IMAGING GUIDED PORT INSERTION  06/13/2022   IR PARACENTESIS  12/17/2021   IR PARACENTESIS  03/25/2022   IR RADIOLOGIST EVAL & MGMT  03/18/2022   IR RADIOLOGIST EVAL & MGMT  05/07/2022   IR RADIOLOGIST EVAL & MGMT  05/16/2022   IR US GUIDE VASC ACCESS RIGHT  02/07/2022   JOINT REPLACEMENT     KNEE SURGERY Right 09/24/2017   plates and pins   TOTAL SHOULDER ARTHROPLASTY Right 09/27/2015    Allergies: Gramineae pollens and Latex  Medications: Prior to Admission medications   Medication Sig Start Date End Date Taking? Authorizing Provider  acetaminophen (TYLENOL) 650 MG CR tablet Take 1,300 mg by mouth daily as needed for pain.    [provider]  albuterol (VENTOLIN HFA) 108 (90 Base) MCG/ACT inhaler Inhale 2 puffs into the lungs every 6 (six) hours as needed for wheezing or shortness of breath. 02/10/22   Loletha Grayer, MD  atorvastatin (LIPITOR) 10 MG tablet Take 1 tablet (10 mg total) by mouth daily. 06/20/22   Fritzi Mandes, MD  diphenhydrAMINE (BENADRYL) 25 MG tablet Take 25 mg by mouth 2 (two) times daily as needed for itching.    [provider]  ergocalciferol (VITAMIN D2) 1.25 MG (50000 UT) capsule Take 1 capsule (50,000 Units total) by mouth every 14 (fourteen) days. 09/01/22   Harriett Rush, PA-C  furosemide (LASIX) 40 MG tablet Take 40 mg by mouth daily. 06/24/22 06/24/23  [provider]  gabapentin (NEURONTIN) 400 MG capsule Take 800 mg by mouth 2 (two) times daily. 01/07/18   [provider]  hydrocortisone (ANUCORT-HC) 25 MG suppository Place 1 suppository (25 mg total) rectally 2 (two) times daily as needed for  anal itching or hemorrhoids. 06/20/22   Fritzi Mandes, MD  hydrocortisone (ANUSOL-HC) 2.5 % rectal cream Place rectally 4 (four) times daily. 03/07/22   Loletha Grayer, MD  lactulose, encephalopathy, (CHRONULAC) 10 GM/15ML SOLN Take 30 mLs (20 g total) by mouth daily. 01/30/22   Roxan Hockey, MD  lidocaine-prilocaine (EMLA) cream Apply 1 Application topically See admin instructions for 1 dose. Apply to port site 1 hour prior to procedure 06/23/22   Narda Rutherford T, NP  melatonin 5 MG TABS Take 10 mg by mouth at bedtime as needed (sleep).    [provider]  Multiple Vitamins-Minerals (MULTIVITAMIN WITH MINERALS) tablet Take 1 tablet by mouth daily.    [provider]  polyethylene glycol (MIRALAX / GLYCOLAX) 17 g packet Take 17 g by mouth 2 (two) times daily. 06/20/22   Fritzi Mandes, MD  sertraline (ZOLOFT) 50 MG tablet Take 50 mg by mouth daily. 12/12/17   [provider]  sertraline (ZOLOFT) 50 MG tablet Take 1 tablet by mouth daily. 07/23/22   [provider]  vitamin B-12 (CYANOCOBALAMIN) 1000 MCG tablet Take 1,000 mcg by mouth daily.    [provider]  witch hazel-glycerin (TUCKS) pad Apply topically as needed for hemorrhoids. 03/07/22   Loletha Grayer, MD     Family History  Problem Relation Age of Onset   Breast cancer Mother 42   Lymphoma Mother    Diabetes Father    Kidney cancer Father    Heart disease Father    Diabetes Sister    Breast cancer Sister 67    Social History   Socioeconomic History   Marital status: Married    Spouse name: Not on file   Number of children: 0   Years of education: Not on file   Highest education level: Not on file  Occupational History   Occupation: EMPLOYED  Tobacco Use   Smoking status: Never   Smokeless tobacco: Never  Vaping Use   Vaping Use: Never used  Substance and Sexual Activity   Alcohol use: No   Drug use: Never   Sexual activity: Not Currently  Other Topics Concern   Not on file   Social History Narrative   Not on file   Social Determinants of Health   Financial Resource Strain: Low Risk  (04/04/2022)   Overall Financial Resource Strain (CARDIA)    Difficulty of Paying Living Expenses: Not hard at all  Food Insecurity: No Food Insecurity (06/18/2022)   Hunger Vital Sign    Worried About Running Out of Food in the Last Year: Never true    [place] in the Last Year: Never true  Transportation Needs: No Transportation Needs (06/18/2022)   PRAPARE - Hydrologist (Medical): No    Lack of Transportation (Non-Medical): No  Physical Activity: Inactive (04/04/2022)   Exercise Vital Sign    Days of Exercise per Week: 0 days    Minutes of Exercise per Session: 0 min  Stress: No Stress Concern Present (04/04/2022)   [place]    Feeling of Stress : Only a little  Social Connections: Moderately Isolated (04/04/2022)   Social Connection and Isolation Panel [NHANES]    Frequency of Communication with Friends and Family: More than three times a week    Frequency of Social Gatherings with Friends and Family: Three times a week    Attends Religious Services: Never    Active Member of Clubs or Organizations: No    Attends Archivist Meetings: Never    Marital Status: Married     Review of Systems: A 12 point ROS discussed and pertinent positives are indicated in the HPI above.  All other systems are negative.  Review of Systems  Constitutional:  Negative for fatigue and fever.  HENT:  Negative for congestion.   Respiratory:  Negative for cough and shortness of breath.   Gastrointestinal:  Negative for abdominal pain, diarrhea, nausea and vomiting.    Vital Signs: BP 117/73   Pulse 82   Temp 97.6 F (36.4 C) (Temporal)   Resp 20   Ht '5\' 9"'$  (1.753 m)   Wt (!) 304 lb 3.2 oz (138 kg)   SpO2 98%   BMI 44.92 kg/m     Physical Exam Vitals and nursing note  reviewed.  Constitutional:      Appearance: She is well-developed.  HENT:     Head: Normocephalic and atraumatic.  Eyes:     Conjunctiva/sclera: Conjunctivae normal.  Cardiovascular:     Rate and Rhythm: Normal rate and regular rhythm.     Heart sounds: Normal  heart sounds.  Pulmonary:     Effort: Pulmonary effort is normal.     Breath sounds: Normal breath sounds.  Musculoskeletal:        General: Normal range of motion.     Cervical back: Normal range of motion.  Skin:    General: Skin is warm.  Neurological:     Mental Status: She is alert and oriented to person, place, and time.     Imaging: US Paracentesis  Result Date: 09/02/2022 INDICATION: Refractory ascites in patient with history of NASH cirrhosis status post TIPS procedure May 2023 and TIPS revision July 2023 at [facility]. Per patient she recently underwent a second TIPS revision 08/20/2022. Request received for therapeutic paracentesis with 8 L max. EXAM: ULTRASOUND GUIDED THERAPEUTIC RIGHT LOWER QUADRANT PARACENTESIS MEDICATIONS: 20 cc 1% lidocaine COMPLICATIONS: None immediate. PROCEDURE: Informed written consent was obtained from the patient after a discussion of the risks, benefits and alternatives to treatment. A timeout was performed prior to the initiation of the procedure. Initial ultrasound scanning demonstrates a moderate amount of ascites within the right lower abdominal quadrant. The right lower abdomen was prepped and draped in the usual sterile fashion. 1% lidocaine was used for local anesthesia. Following this, a 6 Fr Safe-T-Centesis catheter was introduced. An ultrasound image was saved for documentation purposes. The paracentesis was performed. The peritoneal fluid stopped draining while ultrasound imaging showed fluid remained. Patient request another location be accessed to drain remaining fluid. The second attempt to drain fluid was unsuccessful. The catheter was removed and a dressing was applied. Dr. Annamaria Boots  assessed imaging and notified patient not enough fluid remained for third attempt. The patient tolerated the procedure well without immediate post procedural complication. Patient received post-procedure intravenous albumin; see nursing notes for details. FINDINGS: A total of approximately 4.8 L of clear, yellow fluid was removed. IMPRESSION: Successful ultrasound-guided paracentesis yielding 4.8 liters of peritoneal fluid. Read by: Narda Rutherford, AGNP-BC Electronically Signed   By: Jerilynn Mages.  Shick M.D.   On: 09/02/2022 14:08   MM 3D SCREEN BREAST BILATERAL  Result Date: 08/28/2022 CLINICAL DATA:  Screening. EXAM: DIGITAL SCREENING BILATERAL MAMMOGRAM WITH TOMOSYNTHESIS AND CAD TECHNIQUE: Bilateral screening digital craniocaudal and mediolateral oblique mammograms were obtained. Bilateral screening digital breast tomosynthesis was performed. The images were evaluated with computer-aided detection. COMPARISON:  Previous exam(s). ACR Breast Density Category c: The breast tissue is heterogeneously dense, which may obscure small masses. FINDINGS: There are no findings suspicious for malignancy. IMPRESSION: No mammographic evidence of malignancy. A result letter of this screening mammogram will be mailed directly to the patient. RECOMMENDATION: Screening mammogram in one year. (Code:SM-B-01Y) BI-RADS CATEGORY  1: Negative. Electronically Signed   By: Fidela Salisbury M.D.   On: 08/28/2022 12:24   US Paracentesis  Result Date: 08/26/2022 INDICATION: Refractory ascites in patient with history of NASH cirrhosis status post TIPS procedure May 2023 and TIPS revision July 2023 at [facility]. Per patient she recently underwent a second TIPS revision 08/20/2022. EXAM: ULTRASOUND GUIDED THERAPEUTIC RIGHT UPPER QUADRANT PARACENTESIS MEDICATIONS: 10 mL 1 % lidocaine COMPLICATIONS: None immediate. PROCEDURE: Informed written consent was obtained from the patient after a discussion of the risks, benefits and alternatives to treatment. A  timeout was performed prior to the initiation of the procedure. Initial ultrasound scanning demonstrates a large amount of ascites within the right lower abdominal quadrant. The right lower abdomen was prepped and draped in the usual sterile fashion. 1% lidocaine was used for local anesthesia. Following this, a 19 gauge, 10-cm, Teressa Lower  catheter was introduced. An ultrasound image was saved for documentation purposes. The paracentesis was performed. The catheter was removed and a dressing was applied. The patient tolerated the procedure well without immediate post procedural complication. Patient received post-procedure intravenous albumin; see nursing notes for details. FINDINGS: A total of approximately 8 L of clear, yellow fluid was removed. IMPRESSION: Successful ultrasound-guided paracentesis yielding 8 liters of peritoneal fluid. Read by: Narda Rutherford, AGNP-BC PLAN: Patient has previously undergone TIPS procedure with more recent TIPS revision, and is actively followed at [facility]. She is not followed by [facility] vascular and Interventional Radiology [facility] VIR) for her liver-directed intervention. Michaelle Birks, MD Vascular and Interventional Radiology Specialists [facility] Radiology Electronically Signed   By: Michaelle Birks M.D.   On: 08/26/2022 12:26   US Paracentesis  Result Date: 08/19/2022 INDICATION: Refractory ascites in patient with history of NASH cirrhosis status post TIPS procedure May 2023 and TIPS revision July 2023 at [facility]. Per patient, she is to undergo TIPS revision on 08/20/2022 EXAM: ULTRASOUND GUIDED therapeutic right lower quadrant PARACENTESIS MEDICATIONS: 19 cc 1% lidocaine COMPLICATIONS: None immediate. PROCEDURE: Informed written consent was obtained from the patient after a discussion of the risks, benefits and alternatives to treatment. A timeout was performed prior to the initiation of the procedure. Initial ultrasound scanning demonstrates a large amount of ascites within  the right lower abdominal quadrant. The right lower abdomen was prepped and draped in the usual sterile fashion. 1% lidocaine was used for local anesthesia. Following this, a 19 gauge, 10-cm, Yueh catheter was introduced. An ultrasound image was saved for documentation purposes. The paracentesis was performed. The catheter was removed and a dressing was applied. The patient tolerated the procedure well without immediate post procedural complication. Patient received post-procedure intravenous albumin; see nursing notes for details. FINDINGS: A total of approximately 8.0 L of straw-colored fluid was removed. IMPRESSION: Successful ultrasound-guided paracentesis yielding 8.0 liters of peritoneal fluid. PLAN: Patient is being actively followed by [facility] hospital for further TIPS revision. Read by: Reatha Armour, PA-C Electronically Signed   By: Ruthann Cancer M.D.   On: 08/19/2022 16:05   US Paracentesis  Result Date: 08/12/2022 INDICATION: Refractory ascites. Briefly, 67 year old female with history of NASH cirrhosis post TIPS 12/2021 and TIPS revision in 02/2022 at OSH ([facility]). Patient with refractory ascites managed with frequent paracentesis. She reports return evaluation to [facility] for potential TIPS re-revision in the near future. EXAM: ULTRASOUND GUIDED THERAPEUTIC PARACENTESIS MEDICATIONS: None. COMPLICATIONS: None immediate. PROCEDURE: Informed written consent was obtained from the patient after a discussion of the risks, benefits and alternatives to treatment. A timeout was performed prior to the initiation of the procedure. Initial ultrasound scanning demonstrates a large amount of ascites within the right lower abdominal quadrant. The right lower abdomen was prepped and draped in the usual sterile fashion. 1% lidocaine was used for local anesthesia. Following this, a 6 Fr Safe-T-Centesis catheter was introduced. An ultrasound image was saved for documentation purposes. The paracentesis was performed. The  catheter was removed and a dressing was applied. The patient tolerated the procedure well without immediate post procedural complication. Patient received post-procedure intravenous albumin; see nursing notes for details. FINDINGS: A total of approximately 6.75 L of serous peritoneal fluid was removed. IMPRESSION: Successful ultrasound-guided therapeutic paracentesis yielding 6.75 L of peritoneal fluid. PLAN: Patient has previously undergone TIPS procedure with TIPS revision, and is actively follow at [facility] She is not followed at [facility] for her liver-directed intervention. Michaelle Birks, MD Vascular and  Interventional Radiology Specialists [facility] Radiology Electronically Signed   By: Michaelle Birks M.D.   On: 08/12/2022 16:44    Labs:  CBC: Recent Labs    08/13/22 1337 08/20/22 1003 08/27/22 1208 09/03/22 0959  WBC 2.7* 2.3* 2.3* 1.9*  HGB 6.9* 7.6* 6.5* 7.1*  HCT 21.8* 24.0* 20.6* 22.1*  PLT 109* 101* 120* 105*    COAGS: Recent Labs    02/04/22 1245 03/05/22 1650 03/22/22 0206 06/20/22 0626  INR 1.3* 1.2 1.3* 1.3*  APTT 31  --  30  --     BMP: Recent Labs    06/18/22 0633 06/20/22 0626 07/01/22 1449 08/13/22 1337  NA 138 142 139 137  K 4.8 3.3* 4.5 4.1  CL 113* 115* 113* 108  CO2 22 22 21* 23  GLUCOSE 134* 161* 107* 179*  BUN 38* 33* 36* 36*  CALCIUM 8.0* 8.1* 8.5* 8.0*  CREATININE 1.61* 1.56* 1.50* 1.70*  GFRNONAA 36* 37* 39* 33*    LIVER FUNCTION TESTS: Recent Labs    06/17/22 1559 06/20/22 0626 07/01/22 1449 08/13/22 1337  BILITOT 1.9* 1.6* 2.1* 2.1*  AST 32 44* 40 52*  ALT '18 22 23 26  '$ ALKPHOS 96 84 79 108  PROT 4.2* 3.8* 4.7* 4.8*  ALBUMIN 2.5* 2.1* 2.7* 2.6*     Assessment and Plan:  67 y.o. female outpatient. Known to IR. History of NASH cirrhosis with recurrent ascites, s/p TIPS in Dec 26, 2021 and TIPS revision March 12, 2022  and January 4th 2024 [facility].  DM,GERD, uterine cancer, HTN, HLD,  iron deficiency anemia and  thalassemia. Grade IV internal hemorrhoids with ongoing hemorrhoidal bleeding requiring frequent transfusions despite s/p bilateral superior rectal artery particle and coil embolization in February 10, 2022 by Dr. Serafina Royals. Patient presents for angiogram with possible embolization for ongoing rectal bleeding.  Labs pending.  Hgb from 1.17.24 - 7.1. Patient denies any overt bleeding since then. All medications are within acceptable parameters. Allergeis include latex. Patient has been NPO since midnight.   The Risks and benefits of embolization were discussed with the patient including, but not limited to bleeding, infection, vascular injury, post operative pain, or contrast induced renal failure.  This procedure involves the use of X-rays and because of the nature of the planned procedure, it is possible that we will have prolonged use of X-ray fluoroscopy.  Potential radiation risks to you include (but are not limited to) the following: - A slightly elevated risk for cancer several years later in life. This risk is typically less than 0.5% percent. This risk is low in comparison to the normal incidence of human cancer, which is 33% for women and 50% for men according to the [place]. - Radiation induced injury can include skin redness, resembling a rash, tissue breakdown / ulcers and hair loss (which can be temporary or permanent).   The likelihood of either of these occurring depends on the difficulty of the procedure and whether you are sensitive to radiation due to previous procedures, disease, or genetic conditions.   IF your procedure requires a prolonged use of radiation, you will be notified and given written instructions for further action.  It is your responsibility to monitor the irradiated area for the 2 weeks following the procedure and to notify your physician if you are concerned that you have suffered a radiation induced injury.    All of the patient's questions were  answered, patient is agreeable to proceed. Consent signed and in chart.  Thank you for this interesting consult.  I greatly enjoyed meeting Rachel Huber and look forward to participating in their care.  A copy of this report was sent to the requesting provider on this date.  Electronically Signed: Jacqualine Mau, NP 09/05/2022, 11:21 AM   I spent a total of  30 Minutes   in face to face in clinical consultation, greater than 50% of which was counseling/coordinating care for angiogram with embolization

## 2022-09-05 ENCOUNTER — Other Ambulatory Visit (HOSPITAL_COMMUNITY): Payer: Self-pay | Admitting: Interventional Radiology

## 2022-09-05 ENCOUNTER — Other Ambulatory Visit: Payer: Self-pay

## 2022-09-05 ENCOUNTER — Ambulatory Visit (HOSPITAL_COMMUNITY)
Admission: RE | Admit: 2022-09-05 | Discharge: 2022-09-05 | Disposition: A | Discharge status: Home / Self Care | Payer: 59 | Source: Ambulatory Visit | Attending: Interventional Radiology | Admitting: Interventional Radiology

## 2022-09-05 DIAGNOSIS — K643 Fourth degree hemorrhoids: Secondary | ICD-10-CM | POA: Insufficient documentation

## 2022-09-05 DIAGNOSIS — K649 Unspecified hemorrhoids: Secondary | ICD-10-CM

## 2022-09-05 DIAGNOSIS — Z8542 Personal history of malignant neoplasm of other parts of uterus: Secondary | ICD-10-CM | POA: Diagnosis not present

## 2022-09-05 DIAGNOSIS — E785 Hyperlipidemia, unspecified: Secondary | ICD-10-CM | POA: Insufficient documentation

## 2022-09-05 DIAGNOSIS — K219 Gastro-esophageal reflux disease without esophagitis: Secondary | ICD-10-CM | POA: Insufficient documentation

## 2022-09-05 DIAGNOSIS — D569 Thalassemia, unspecified: Secondary | ICD-10-CM | POA: Insufficient documentation

## 2022-09-05 DIAGNOSIS — K7460 Unspecified cirrhosis of liver: Secondary | ICD-10-CM | POA: Insufficient documentation

## 2022-09-05 DIAGNOSIS — I10 Essential (primary) hypertension: Secondary | ICD-10-CM | POA: Diagnosis not present

## 2022-09-05 DIAGNOSIS — K625 Hemorrhage of anus and rectum: Secondary | ICD-10-CM | POA: Diagnosis not present

## 2022-09-05 DIAGNOSIS — K7581 Nonalcoholic steatohepatitis (NASH): Secondary | ICD-10-CM | POA: Insufficient documentation

## 2022-09-05 DIAGNOSIS — D500 Iron deficiency anemia secondary to blood loss (chronic): Secondary | ICD-10-CM | POA: Insufficient documentation

## 2022-09-05 DIAGNOSIS — E119 Type 2 diabetes mellitus without complications: Secondary | ICD-10-CM | POA: Diagnosis not present

## 2022-09-05 HISTORY — PX: IR ANGIOGRAM VISCERAL SELECTIVE: IMG657

## 2022-09-05 HISTORY — PX: IR ANGIOGRAM SELECTIVE EACH ADDITIONAL VESSEL: IMG667

## 2022-09-05 HISTORY — PX: IR US GUIDE VASC ACCESS RIGHT: IMG2390

## 2022-09-05 HISTORY — PX: IR EMBO ART  VEN HEMORR LYMPH EXTRAV  INC GUIDE ROADMAPPING: IMG5450

## 2022-09-05 LAB — PROTIME-INR
INR: 1.3 — ABNORMAL HIGH (ref 0.8–1.2)
Prothrombin Time: 15.6 seconds — ABNORMAL HIGH (ref 11.4–15.2)

## 2022-09-05 LAB — CBC
HCT: 23.1 % — ABNORMAL LOW (ref 36.0–46.0)
Hemoglobin: 7.2 g/dL — ABNORMAL LOW (ref 12.0–15.0)
MCH: 25.1 pg — ABNORMAL LOW (ref 26.0–34.0)
MCHC: 31.2 g/dL (ref 30.0–36.0)
MCV: 80.5 fL (ref 80.0–100.0)
Platelets: 104 10*3/uL — ABNORMAL LOW (ref 150–400)
RBC: 2.87 MIL/uL — ABNORMAL LOW (ref 3.87–5.11)
RDW: 21.4 % — ABNORMAL HIGH (ref 11.5–15.5)
WBC: 2.6 10*3/uL — ABNORMAL LOW (ref 4.0–10.5)
nRBC: 0 % (ref 0.0–0.2)

## 2022-09-05 LAB — BASIC METABOLIC PANEL
Anion gap: 6 (ref 5–15)
BUN: 28 mg/dL — ABNORMAL HIGH (ref 8–23)
CO2: 23 mmol/L (ref 22–32)
Calcium: 8.3 mg/dL — ABNORMAL LOW (ref 8.9–10.3)
Chloride: 109 mmol/L (ref 98–111)
Creatinine, Ser: 1.43 mg/dL — ABNORMAL HIGH (ref 0.44–1.00)
GFR, Estimated: 41 mL/min — ABNORMAL LOW (ref >60)
Glucose, Bld: 132 mg/dL — ABNORMAL HIGH (ref 70–99)
Potassium: 4.1 mmol/L (ref 3.5–5.1)
Sodium: 138 mmol/L (ref 135–145)

## 2022-09-05 MED ORDER — FENTANYL CITRATE (PF) 100 MCG/2ML IJ SOLN
INTRAMUSCULAR | Status: AC
  Filled 2022-09-05: qty 2

## 2022-09-05 MED ORDER — LIDOCAINE HCL 1 % IJ SOLN
INTRAMUSCULAR | Status: AC
  Filled 2022-09-05: qty 20

## 2022-09-05 MED ORDER — MIDAZOLAM HCL 2 MG/2ML IJ SOLN
INTRAMUSCULAR | Status: AC | PRN
  Administered 2022-09-05 (×2): .5 mg via INTRAVENOUS

## 2022-09-05 MED ORDER — SODIUM CHLORIDE 0.9 % IV SOLN: INTRAVENOUS | Status: DC

## 2022-09-05 MED ORDER — FENTANYL CITRATE (PF) 100 MCG/2ML IJ SOLN
INTRAMUSCULAR | Status: AC | PRN
  Administered 2022-09-05 (×2): 25 ug via INTRAVENOUS

## 2022-09-05 MED ORDER — IOHEXOL 300 MG/ML  SOLN
100.0000 mL | Freq: Once | INTRAMUSCULAR | Status: AC | PRN
  Administered 2022-09-05: 60 mL via INTRA_ARTERIAL

## 2022-09-05 MED ORDER — IOHEXOL 300 MG/ML  SOLN
100.0000 mL | Freq: Once | INTRAMUSCULAR | Status: AC | PRN
  Administered 2022-09-05: 20 mL via INTRA_ARTERIAL

## 2022-09-05 MED ORDER — MIDAZOLAM HCL 2 MG/2ML IJ SOLN
INTRAMUSCULAR | Status: AC
  Filled 2022-09-05: qty 2

## 2022-09-05 MED ORDER — HEPARIN SOD (PORK) LOCK FLUSH 100 UNIT/ML IV SOLN
500.0000 [IU] | INTRAVENOUS | Status: AC | PRN
  Administered 2022-09-05: 500 [IU]

## 2022-09-05 NOTE — Progress Notes (Signed)
Arrived to unit, patient not ready for DC. Nurse will re-consult when patient is ready for DC. Fran Lowes, RN VAST

## 2022-09-05 NOTE — Progress Notes (Signed)
HOB elevated 30 degrees.

## 2022-09-05 NOTE — Progress Notes (Signed)
Pt ambulated to and from bathroom with no oozing from site

## 2022-09-05 NOTE — Procedures (Signed)
Interventional Radiology Procedure Note  Procedure:  1) Inferior mesenteric angiogram 2) Particle and coil embolization of duplicated right superior rectal artery 3) Coil embolization of left superior rectal artery  Findings: Please refer to procedural dictation for full description. New duplicated right superior rectal artery supplying hemorrhoids.  Particle (200 micron Hydropearl) and coil embolization successful.  Partially recanalized prior embolized left superior rectal artery with additional coil placed.  6 Fr right CFA Angioseal  Complications: None immediate  Estimated Blood Loss: < 5 mL  Recommendations: Strict 4 hour bedrest (2 hours flat followed by 2 hours head of bed up to 30 degrees). IR will arrange outpatient follow up in 1 month.   Ruthann Cancer, MD

## 2022-09-05 NOTE — Progress Notes (Signed)
Per Chong Sicilian, Charge nurse in IR, okay to access portacath for IR procedure today.

## 2022-09-09 ENCOUNTER — Ambulatory Visit
Admission: RE | Admit: 2022-09-09 | Discharge: 2022-09-09 | Disposition: A | Discharge status: Home / Self Care | Payer: 59 | Source: Ambulatory Visit | Attending: Gastroenterology | Admitting: Gastroenterology

## 2022-09-09 ENCOUNTER — Other Ambulatory Visit: Payer: Self-pay | Admitting: Interventional Radiology

## 2022-09-09 DIAGNOSIS — K7581 Nonalcoholic steatohepatitis (NASH): Secondary | ICD-10-CM | POA: Insufficient documentation

## 2022-09-09 DIAGNOSIS — R188 Other ascites: Secondary | ICD-10-CM | POA: Diagnosis not present

## 2022-09-09 DIAGNOSIS — K7460 Unspecified cirrhosis of liver: Secondary | ICD-10-CM | POA: Diagnosis not present

## 2022-09-09 DIAGNOSIS — K766 Portal hypertension: Secondary | ICD-10-CM

## 2022-09-09 MED ORDER — ALBUMIN HUMAN 25 % IV SOLN
12.5000 g | Freq: Once | INTRAVENOUS | Status: AC
  Administered 2022-09-09: 12.5 g via INTRAVENOUS
  Filled 2022-09-09: qty 100

## 2022-09-09 MED ORDER — ALBUMIN HUMAN 25 % IV SOLN
INTRAVENOUS | Status: AC
  Administered 2022-09-09: 25 g via INTRAVENOUS
  Filled 2022-09-09: qty 100

## 2022-09-09 MED ORDER — LIDOCAINE HCL (PF) 1 % IJ SOLN
10.0000 mL | Freq: Once | INTRAMUSCULAR | Status: AC
  Administered 2022-09-09: 10 mL via INTRADERMAL

## 2022-09-09 MED ORDER — HEPARIN SOD (PORK) LOCK FLUSH 100 UNIT/ML IV SOLN
INTRAVENOUS | Status: AC
  Administered 2022-09-09: 500 [IU]
  Filled 2022-09-09: qty 5

## 2022-09-09 MED ORDER — ALBUMIN HUMAN 25 % IV SOLN: 25.0000 g | Freq: Once | INTRAVENOUS | Status: AC

## 2022-09-09 NOTE — OR Nursing (Signed)
Pt very groggy. Answers when name called. She responds appropriately. When asked she said she didn't sleep well last night. She said she had two hours sleep last night. Readily falls asleep when stimulation stopped.

## 2022-09-09 NOTE — Procedures (Signed)
PROCEDURE SUMMARY:  Successful US guided paracentesis from LLQ.  Yielded 6L of clear yellow fluid.  No immediate complications.  Pt tolerated well.   Specimen not sent for labs.  EBL < 26m  Dale Strausser PA-C 09/09/2022 12:04 PM

## 2022-09-10 ENCOUNTER — Inpatient Hospital Stay: Payer: 59

## 2022-09-10 DIAGNOSIS — D500 Iron deficiency anemia secondary to blood loss (chronic): Secondary | ICD-10-CM | POA: Diagnosis not present

## 2022-09-10 LAB — CBC
HCT: 18.3 % — ABNORMAL LOW (ref 36.0–46.0)
Hemoglobin: 5.8 g/dL — CL (ref 12.0–15.0)
MCH: 24.8 pg — ABNORMAL LOW (ref 26.0–34.0)
MCHC: 31.7 g/dL (ref 30.0–36.0)
MCV: 78.2 fL — ABNORMAL LOW (ref 80.0–100.0)
Platelets: 125 K/uL — ABNORMAL LOW (ref 150–400)
RBC: 2.34 MIL/uL — ABNORMAL LOW (ref 3.87–5.11)
RDW: 21.9 % — ABNORMAL HIGH (ref 11.5–15.5)
WBC: 3.5 K/uL — ABNORMAL LOW (ref 4.0–10.5)
nRBC: 0.6 % — ABNORMAL HIGH (ref 0.0–0.2)

## 2022-09-10 LAB — SAMPLE TO BLOOD BANK

## 2022-09-10 LAB — PREPARE RBC (CROSSMATCH)

## 2022-09-10 MED ORDER — SODIUM CHLORIDE 0.9% FLUSH
10.0000 mL | INTRAVENOUS | Status: DC | PRN
  Administered 2022-09-10: 10 mL via INTRAVENOUS

## 2022-09-10 MED ORDER — HEPARIN SOD (PORK) LOCK FLUSH 100 UNIT/ML IV SOLN
500.0000 [IU] | Freq: Once | INTRAVENOUS | Status: AC
  Administered 2022-09-10: 500 [IU] via INTRAVENOUS

## 2022-09-10 NOTE — Progress Notes (Signed)
CRITICAL VALUE ALERT Critical value received:  HGB 5.8.  Date of notification:  09-10-2022 Time of notification: 12:23 Critical value read back:  Yes.   Nurse who received alert:  B.Jodell Weitman RN.  MD notified time and response: R. Pennington PA @ [id number]. Standing orders to receive 2 units of blood on 09/11/2022.

## 2022-09-10 NOTE — Progress Notes (Signed)
Rachel Huber presented for Portacath access and flush. Portacath located right chest wall accessed with  H 20 needle. Good blood return present. Portacath flushed with 26m NS and 500U/519mHeparin and needle removed intact. Procedure without incident. Patient tolerated procedure well.

## 2022-09-10 NOTE — Patient Instructions (Signed)
[facility]  Discharge Instructions: Thank you for choosing [facility] to provide your oncology and hematology care.  If you have a lab appointment with the [facility], please come in thru the Main Entrance and check in at the main information desk.  Wear comfortable clothing and clothing appropriate for easy access to any Portacath or PICC line.   We strive to give you quality time with your provider. You may need to reschedule your appointment if you arrive late (15 or more minutes).  Arriving late affects you and other patients whose appointments are after yours.  Also, if you miss three or more appointments without notifying the office, you may be dismissed from the clinic at the provider's discretion.      For prescription refill requests, have your pharmacy contact our office and allow 72 hours for refills to be completed.    Today you had your port flushed and labs drawn.    To help prevent nausea and vomiting after your treatment, we encourage you to take your nausea medication as directed.  BELOW ARE SYMPTOMS THAT SHOULD BE REPORTED IMMEDIATELY: *FEVER GREATER THAN 100.4 F (38 C) OR HIGHER *CHILLS OR SWEATING *NAUSEA AND VOMITING THAT IS NOT CONTROLLED WITH YOUR NAUSEA MEDICATION *UNUSUAL SHORTNESS OF BREATH *UNUSUAL BRUISING OR BLEEDING *URINARY PROBLEMS (pain or burning when urinating, or frequent urination) *BOWEL PROBLEMS (unusual diarrhea, constipation, pain near the anus) TENDERNESS IN MOUTH AND THROAT WITH OR WITHOUT PRESENCE OF ULCERS (sore throat, sores in mouth, or a toothache) UNUSUAL RASH, SWELLING OR PAIN  UNUSUAL VAGINAL DISCHARGE OR ITCHING   Items with * indicate a potential emergency and should be followed up as soon as possible or go to the Emergency Department if any problems should occur.  Please show the CHEMOTHERAPY ALERT CARD or IMMUNOTHERAPY ALERT CARD at check-in to the Emergency Department and triage  nurse.  Should you have questions after your visit or need to cancel or reschedule your appointment, please contact [facility] [phone]  and follow the prompts.  Office hours are 8:00 a.m. to 4:30 p.m. Monday - Friday. Please note that voicemails left after 4:00 p.m. may not be returned until the following business day.  We are closed weekends and major holidays. You have access to a nurse at all times for urgent questions. Please call the main number to the clinic [phone] and follow the prompts.  For any non-urgent questions, you may also contact your provider using MyChart. We now offer e-Visits for anyone 10 and older to request care online for non-urgent symptoms. For details visit [email or web address].   Also download the MyChart app! Go to the app store, search "MyChart", open the app, select [facility], and log in with your MyChart username and password.

## 2022-09-10 NOTE — Progress Notes (Signed)
Patient requested to get her injection tomorrow when she comes back for blood. Patient could not wait for lab results today, she wanted to leave. Will schedule accordingly.

## 2022-09-11 ENCOUNTER — Inpatient Hospital Stay: Payer: 59

## 2022-09-11 DIAGNOSIS — D500 Iron deficiency anemia secondary to blood loss (chronic): Secondary | ICD-10-CM

## 2022-09-11 MED ORDER — SODIUM CHLORIDE 0.9% IV SOLUTION
250.0000 mL | Freq: Once | INTRAVENOUS | Status: AC
  Administered 2022-09-11: 250 mL via INTRAVENOUS

## 2022-09-11 MED ORDER — DIPHENHYDRAMINE HCL 25 MG PO CAPS: 25.0000 mg | ORAL_CAPSULE | Freq: Once | ORAL | Status: DC

## 2022-09-11 MED ORDER — HEPARIN SOD (PORK) LOCK FLUSH 100 UNIT/ML IV SOLN
500.0000 [IU] | Freq: Every day | INTRAVENOUS | Status: AC | PRN
  Administered 2022-09-11: 500 [IU]

## 2022-09-11 MED ORDER — SODIUM CHLORIDE 0.9% FLUSH
10.0000 mL | INTRAVENOUS | Status: AC | PRN
  Administered 2022-09-11: 10 mL

## 2022-09-11 MED ORDER — ACETAMINOPHEN 325 MG PO TABS: 650.0000 mg | ORAL_TABLET | Freq: Once | ORAL | Status: DC

## 2022-09-11 NOTE — Patient Instructions (Signed)
[facility]  Discharge Instructions: Thank you for choosing [facility] to provide your oncology and hematology care.  If you have a lab appointment with the [facility], please come in thru the Main Entrance and check in at the main information desk.  Wear comfortable clothing and clothing appropriate for easy access to any Portacath or PICC line.   We strive to give you quality time with your provider. You may need to reschedule your appointment if you arrive late (15 or more minutes).  Arriving late affects you and other patients whose appointments are after yours.  Also, if you miss three or more appointments without notifying the office, you may be dismissed from the clinic at the provider's discretion.      For prescription refill requests, have your pharmacy contact our office and allow 72 hours for refills to be completed.    Today you received the following 2 units of blood   To help prevent nausea and vomiting after your treatment, we encourage you to take your nausea medication as directed.  BELOW ARE SYMPTOMS THAT SHOULD BE REPORTED IMMEDIATELY: *FEVER GREATER THAN 100.4 F (38 C) OR HIGHER *CHILLS OR SWEATING *NAUSEA AND VOMITING THAT IS NOT CONTROLLED WITH YOUR NAUSEA MEDICATION *UNUSUAL SHORTNESS OF BREATH *UNUSUAL BRUISING OR BLEEDING *URINARY PROBLEMS (pain or burning when urinating, or frequent urination) *BOWEL PROBLEMS (unusual diarrhea, constipation, pain near the anus) TENDERNESS IN MOUTH AND THROAT WITH OR WITHOUT PRESENCE OF ULCERS (sore throat, sores in mouth, or a toothache) UNUSUAL RASH, SWELLING OR PAIN  UNUSUAL VAGINAL DISCHARGE OR ITCHING   Items with * indicate a potential emergency and should be followed up as soon as possible or go to the Emergency Department if any problems should occur.  Please show the CHEMOTHERAPY ALERT CARD or IMMUNOTHERAPY ALERT CARD at check-in to the Emergency Department and triage  nurse.  Should you have questions after your visit or need to cancel or reschedule your appointment, please contact [facility] [phone]  and follow the prompts.  Office hours are 8:00 a.m. to 4:30 p.m. Monday - Friday. Please note that voicemails left after 4:00 p.m. may not be returned until the following business day.  We are closed weekends and major holidays. You have access to a nurse at all times for urgent questions. Please call the main number to the clinic [phone] and follow the prompts.  For any non-urgent questions, you may also contact your provider using MyChart. We now offer e-Visits for anyone 21 and older to request care online for non-urgent symptoms. For details visit [email or web address].   Also download the MyChart app! Go to the app store, search "MyChart", open the app, select [facility], and log in with your MyChart username and password.

## 2022-09-11 NOTE — Progress Notes (Signed)
2 units of blood given per orders. Patient tolerated it well without problems. Vitals stable and discharged home from clinic via wheelchair. Follow up as scheduled.

## 2022-09-12 LAB — TYPE AND SCREEN
ABO/RH(D): A POS
Antibody Screen: NEGATIVE
Unit division: 0
Unit division: 0

## 2022-09-12 LAB — BPAM RBC
Blood Product Expiration Date: 202402172359
Blood Product Expiration Date: 202402172359
ISSUE DATE / TIME: 202401251030
ISSUE DATE / TIME: 202401251223
Unit Type and Rh: 6200
Unit Type and Rh: 6200

## 2022-09-16 ENCOUNTER — Other Ambulatory Visit: Payer: Self-pay | Admitting: Gastroenterology

## 2022-09-16 ENCOUNTER — Ambulatory Visit
Admission: RE | Admit: 2022-09-16 | Discharge: 2022-09-16 | Disposition: A | Discharge status: Home / Self Care | Payer: 59 | Source: Ambulatory Visit | Attending: Gastroenterology | Admitting: Gastroenterology

## 2022-09-16 DIAGNOSIS — K7460 Unspecified cirrhosis of liver: Secondary | ICD-10-CM

## 2022-09-16 DIAGNOSIS — K7581 Nonalcoholic steatohepatitis (NASH): Secondary | ICD-10-CM | POA: Insufficient documentation

## 2022-09-16 DIAGNOSIS — R188 Other ascites: Secondary | ICD-10-CM | POA: Diagnosis not present

## 2022-09-16 MED ORDER — HEPARIN SOD (PORK) LOCK FLUSH 100 UNIT/ML IV SOLN
INTRAVENOUS | Status: AC
  Filled 2022-09-16: qty 5

## 2022-09-16 MED ORDER — ALBUMIN HUMAN 25 % IV SOLN: 25.0000 g | Freq: Once | INTRAVENOUS | Status: AC

## 2022-09-16 MED ORDER — ACETAMINOPHEN 325 MG PO TABS
ORAL_TABLET | ORAL | Status: AC
  Filled 2022-09-16: qty 2

## 2022-09-16 MED ORDER — ALBUMIN HUMAN 25 % IV SOLN
INTRAVENOUS | Status: AC
  Administered 2022-09-16: 25 g via INTRAVENOUS
  Filled 2022-09-16: qty 100

## 2022-09-16 MED ORDER — ACETAMINOPHEN 325 MG PO TABS
650.0000 mg | ORAL_TABLET | Freq: Once | ORAL | Status: AC
  Administered 2022-09-16: 650 mg via ORAL

## 2022-09-16 MED ORDER — ALBUMIN HUMAN 25 % IV SOLN: 25.0000 g | Freq: Once | INTRAVENOUS | Status: DC

## 2022-09-16 MED ORDER — LIDOCAINE HCL (PF) 1 % IJ SOLN
10.0000 mL | Freq: Once | INTRAMUSCULAR | Status: AC
  Administered 2022-09-16: 10 mL via INTRADERMAL
  Filled 2022-09-16: qty 10

## 2022-09-16 NOTE — Procedures (Signed)
PROCEDURE SUMMARY:  Successful US guided therapeutic paracentesis from RUQ.  Yielded 8 L of clear, yellow fluid.  No immediate complications.  Pt tolerated well.   Specimen not sent for labs.  EBL < 1 mL  Tyson Alias, AGNP 09/16/2022 12:18 PM \

## 2022-09-17 ENCOUNTER — Emergency Department (HOSPITAL_COMMUNITY): Payer: 59

## 2022-09-17 ENCOUNTER — Other Ambulatory Visit: Payer: Self-pay

## 2022-09-17 ENCOUNTER — Inpatient Hospital Stay: Payer: 59

## 2022-09-17 ENCOUNTER — Inpatient Hospital Stay (HOSPITAL_COMMUNITY)
Admission: EM | Admit: 2022-09-17 | Discharge: 2022-09-24 | DRG: 314 | Disposition: A | Discharge status: Home / Self Care | Payer: 59 | Attending: Internal Medicine | Admitting: Internal Medicine

## 2022-09-17 VITALS — BP 131/46 | HR 84 | Temp 99.5°F | Resp 18

## 2022-09-17 DIAGNOSIS — R4182 Altered mental status, unspecified: Secondary | ICD-10-CM | POA: Diagnosis not present

## 2022-09-17 DIAGNOSIS — D61818 Other pancytopenia: Secondary | ICD-10-CM

## 2022-09-17 DIAGNOSIS — E538 Deficiency of other specified B group vitamins: Secondary | ICD-10-CM | POA: Diagnosis present

## 2022-09-17 DIAGNOSIS — E1142 Type 2 diabetes mellitus with diabetic polyneuropathy: Secondary | ICD-10-CM | POA: Diagnosis present

## 2022-09-17 DIAGNOSIS — Z91040 Latex allergy status: Secondary | ICD-10-CM

## 2022-09-17 DIAGNOSIS — D561 Beta thalassemia: Secondary | ICD-10-CM | POA: Diagnosis present

## 2022-09-17 DIAGNOSIS — D62 Acute posthemorrhagic anemia: Secondary | ICD-10-CM | POA: Diagnosis present

## 2022-09-17 DIAGNOSIS — Z90710 Acquired absence of both cervix and uterus: Secondary | ICD-10-CM

## 2022-09-17 DIAGNOSIS — Z79899 Other long term (current) drug therapy: Secondary | ICD-10-CM

## 2022-09-17 DIAGNOSIS — Z96611 Presence of right artificial shoulder joint: Secondary | ICD-10-CM | POA: Diagnosis present

## 2022-09-17 DIAGNOSIS — F419 Anxiety disorder, unspecified: Secondary | ICD-10-CM | POA: Diagnosis present

## 2022-09-17 DIAGNOSIS — E1169 Type 2 diabetes mellitus with other specified complication: Secondary | ICD-10-CM | POA: Diagnosis present

## 2022-09-17 DIAGNOSIS — Z8249 Family history of ischemic heart disease and other diseases of the circulatory system: Secondary | ICD-10-CM

## 2022-09-17 DIAGNOSIS — K643 Fourth degree hemorrhoids: Secondary | ICD-10-CM | POA: Diagnosis present

## 2022-09-17 DIAGNOSIS — Z8701 Personal history of pneumonia (recurrent): Secondary | ICD-10-CM

## 2022-09-17 DIAGNOSIS — R509 Fever, unspecified: Secondary | ICD-10-CM | POA: Diagnosis present

## 2022-09-17 DIAGNOSIS — E1140 Type 2 diabetes mellitus with diabetic neuropathy, unspecified: Secondary | ICD-10-CM | POA: Diagnosis present

## 2022-09-17 DIAGNOSIS — Z8719 Personal history of other diseases of the digestive system: Secondary | ICD-10-CM

## 2022-09-17 DIAGNOSIS — K7031 Alcoholic cirrhosis of liver with ascites: Secondary | ICD-10-CM | POA: Diagnosis present

## 2022-09-17 DIAGNOSIS — Z9109 Other allergy status, other than to drugs and biological substances: Secondary | ICD-10-CM

## 2022-09-17 DIAGNOSIS — N179 Acute kidney failure, unspecified: Secondary | ICD-10-CM | POA: Diagnosis present

## 2022-09-17 DIAGNOSIS — Z6841 Body Mass Index (BMI) 40.0 and over, adult: Secondary | ICD-10-CM

## 2022-09-17 DIAGNOSIS — D500 Iron deficiency anemia secondary to blood loss (chronic): Secondary | ICD-10-CM

## 2022-09-17 DIAGNOSIS — I959 Hypotension, unspecified: Secondary | ICD-10-CM | POA: Diagnosis not present

## 2022-09-17 DIAGNOSIS — R609 Edema, unspecified: Secondary | ICD-10-CM | POA: Diagnosis not present

## 2022-09-17 DIAGNOSIS — D631 Anemia in chronic kidney disease: Secondary | ICD-10-CM

## 2022-09-17 DIAGNOSIS — R519 Headache, unspecified: Secondary | ICD-10-CM | POA: Diagnosis present

## 2022-09-17 DIAGNOSIS — Z833 Family history of diabetes mellitus: Secondary | ICD-10-CM

## 2022-09-17 DIAGNOSIS — K909 Intestinal malabsorption, unspecified: Secondary | ICD-10-CM | POA: Diagnosis present

## 2022-09-17 DIAGNOSIS — N189 Chronic kidney disease, unspecified: Secondary | ICD-10-CM | POA: Diagnosis present

## 2022-09-17 DIAGNOSIS — A419 Sepsis, unspecified organism: Secondary | ICD-10-CM

## 2022-09-17 DIAGNOSIS — I081 Rheumatic disorders of both mitral and tricuspid valves: Secondary | ICD-10-CM | POA: Diagnosis present

## 2022-09-17 DIAGNOSIS — F32A Depression, unspecified: Secondary | ICD-10-CM | POA: Diagnosis present

## 2022-09-17 DIAGNOSIS — D696 Thrombocytopenia, unspecified: Secondary | ICD-10-CM | POA: Diagnosis present

## 2022-09-17 DIAGNOSIS — I129 Hypertensive chronic kidney disease with stage 1 through stage 4 chronic kidney disease, or unspecified chronic kidney disease: Secondary | ICD-10-CM | POA: Diagnosis present

## 2022-09-17 DIAGNOSIS — E669 Obesity, unspecified: Secondary | ICD-10-CM | POA: Diagnosis present

## 2022-09-17 DIAGNOSIS — F418 Other specified anxiety disorders: Secondary | ICD-10-CM | POA: Diagnosis present

## 2022-09-17 DIAGNOSIS — K766 Portal hypertension: Secondary | ICD-10-CM | POA: Diagnosis present

## 2022-09-17 DIAGNOSIS — R7881 Bacteremia: Principal | ICD-10-CM

## 2022-09-17 DIAGNOSIS — R6510 Systemic inflammatory response syndrome (SIRS) of non-infectious origin without acute organ dysfunction: Secondary | ICD-10-CM

## 2022-09-17 DIAGNOSIS — E785 Hyperlipidemia, unspecified: Secondary | ICD-10-CM | POA: Diagnosis present

## 2022-09-17 DIAGNOSIS — Z8616 Personal history of COVID-19: Secondary | ICD-10-CM

## 2022-09-17 DIAGNOSIS — G4700 Insomnia, unspecified: Secondary | ICD-10-CM | POA: Diagnosis present

## 2022-09-17 DIAGNOSIS — G4733 Obstructive sleep apnea (adult) (pediatric): Secondary | ICD-10-CM | POA: Diagnosis present

## 2022-09-17 DIAGNOSIS — Z743 Need for continuous supervision: Secondary | ICD-10-CM | POA: Diagnosis not present

## 2022-09-17 DIAGNOSIS — M1990 Unspecified osteoarthritis, unspecified site: Secondary | ICD-10-CM | POA: Diagnosis present

## 2022-09-17 DIAGNOSIS — N1832 Chronic kidney disease, stage 3b: Secondary | ICD-10-CM | POA: Diagnosis present

## 2022-09-17 DIAGNOSIS — Z1152 Encounter for screening for COVID-19: Secondary | ICD-10-CM

## 2022-09-17 DIAGNOSIS — T80211A Bloodstream infection due to central venous catheter, initial encounter: Principal | ICD-10-CM | POA: Diagnosis present

## 2022-09-17 DIAGNOSIS — E1122 Type 2 diabetes mellitus with diabetic chronic kidney disease: Secondary | ICD-10-CM | POA: Diagnosis present

## 2022-09-17 DIAGNOSIS — Y848 Other medical procedures as the cause of abnormal reaction of the patient, or of later complication, without mention of misadventure at the time of the procedure: Secondary | ICD-10-CM | POA: Diagnosis present

## 2022-09-17 DIAGNOSIS — R918 Other nonspecific abnormal finding of lung field: Secondary | ICD-10-CM | POA: Diagnosis not present

## 2022-09-17 DIAGNOSIS — A411 Sepsis due to other specified staphylococcus: Secondary | ICD-10-CM | POA: Diagnosis present

## 2022-09-17 DIAGNOSIS — I517 Cardiomegaly: Secondary | ICD-10-CM | POA: Diagnosis not present

## 2022-09-17 DIAGNOSIS — Z95828 Presence of other vascular implants and grafts: Secondary | ICD-10-CM

## 2022-09-17 DIAGNOSIS — K219 Gastro-esophageal reflux disease without esophagitis: Secondary | ICD-10-CM | POA: Diagnosis present

## 2022-09-17 DIAGNOSIS — D563 Thalassemia minor: Secondary | ICD-10-CM | POA: Diagnosis present

## 2022-09-17 DIAGNOSIS — K7581 Nonalcoholic steatohepatitis (NASH): Secondary | ICD-10-CM | POA: Diagnosis present

## 2022-09-17 DIAGNOSIS — I10 Essential (primary) hypertension: Secondary | ICD-10-CM | POA: Diagnosis not present

## 2022-09-17 LAB — CBC WITH DIFFERENTIAL/PLATELET
Abs Immature Granulocytes: 0.02 10*3/uL (ref 0.00–0.07)
Abs Immature Granulocytes: 0.09 10*3/uL — ABNORMAL HIGH (ref 0.00–0.07)
Basophils Absolute: 0 10*3/uL (ref 0.0–0.1)
Basophils Absolute: 0 10*3/uL (ref 0.0–0.1)
Basophils Relative: 0 %
Basophils Relative: 0 %
Eosinophils Absolute: 0.2 10*3/uL (ref 0.0–0.5)
Eosinophils Absolute: 0 10*3/uL (ref 0.0–0.5)
Eosinophils Relative: 4 %
Eosinophils Relative: 0 %
HCT: 22.6 % — ABNORMAL LOW (ref 36.0–46.0)
HCT: 20.8 % — ABNORMAL LOW (ref 36.0–46.0)
Hemoglobin: 7.1 g/dL — ABNORMAL LOW (ref 12.0–15.0)
Hemoglobin: 6.7 g/dL — CL (ref 12.0–15.0)
Immature Granulocytes: 0 %
Immature Granulocytes: 1 %
Lymphocytes Relative: 13 %
Lymphocytes Relative: 3 %
Lymphs Abs: 0.6 10*3/uL — ABNORMAL LOW (ref 0.7–4.0)
Lymphs Abs: 0.5 10*3/uL — ABNORMAL LOW (ref 0.7–4.0)
MCH: 25.3 pg — ABNORMAL LOW (ref 26.0–34.0)
MCH: 25.4 pg — ABNORMAL LOW (ref 26.0–34.0)
MCHC: 31.4 g/dL (ref 30.0–36.0)
MCHC: 32.2 g/dL (ref 30.0–36.0)
MCV: 80.4 fL (ref 80.0–100.0)
MCV: 78.8 fL — ABNORMAL LOW (ref 80.0–100.0)
Monocytes Absolute: 0.4 10*3/uL (ref 0.1–1.0)
Monocytes Absolute: 0.5 10*3/uL (ref 0.1–1.0)
Monocytes Relative: 8 %
Monocytes Relative: 3 %
Neutro Abs: 3.5 10*3/uL (ref 1.7–7.7)
Neutro Abs: 14.9 10*3/uL — ABNORMAL HIGH (ref 1.7–7.7)
Neutrophils Relative %: 75 %
Neutrophils Relative %: 93 %
Platelets: 116 10*3/uL — ABNORMAL LOW (ref 150–400)
Platelets: 123 10*3/uL — ABNORMAL LOW (ref 150–400)
RBC: 2.81 MIL/uL — ABNORMAL LOW (ref 3.87–5.11)
RBC: 2.64 MIL/uL — ABNORMAL LOW (ref 3.87–5.11)
RDW: 21 % — ABNORMAL HIGH (ref 11.5–15.5)
RDW: 21 % — ABNORMAL HIGH (ref 11.5–15.5)
WBC: 4.7 10*3/uL (ref 4.0–10.5)
WBC: 16 10*3/uL — ABNORMAL HIGH (ref 4.0–10.5)
nRBC: 0.4 % — ABNORMAL HIGH (ref 0.0–0.2)
nRBC: 0.2 % (ref 0.0–0.2)

## 2022-09-17 LAB — RESP PANEL BY RT-PCR (RSV, FLU A&B, COVID)  RVPGX2
Influenza A by PCR: NEGATIVE
Influenza B by PCR: NEGATIVE
Resp Syncytial Virus by PCR: NEGATIVE
SARS Coronavirus 2 by RT PCR: NEGATIVE

## 2022-09-17 LAB — COMPREHENSIVE METABOLIC PANEL
ALT: 23 U/L (ref 0–44)
AST: 53 U/L — ABNORMAL HIGH (ref 15–41)
Albumin: 2.6 g/dL — ABNORMAL LOW (ref 3.5–5.0)
Alkaline Phosphatase: 98 U/L (ref 38–126)
Anion gap: 7 (ref 5–15)
BUN: 30 mg/dL — ABNORMAL HIGH (ref 8–23)
CO2: 20 mmol/L — ABNORMAL LOW (ref 22–32)
Calcium: 7.9 mg/dL — ABNORMAL LOW (ref 8.9–10.3)
Chloride: 107 mmol/L (ref 98–111)
Creatinine, Ser: 1.41 mg/dL — ABNORMAL HIGH (ref 0.44–1.00)
GFR, Estimated: 42 mL/min — ABNORMAL LOW (ref >60)
Glucose, Bld: 168 mg/dL — ABNORMAL HIGH (ref 70–99)
Potassium: 4.6 mmol/L (ref 3.5–5.1)
Sodium: 134 mmol/L — ABNORMAL LOW (ref 135–145)
Total Bilirubin: 3 mg/dL — ABNORMAL HIGH (ref 0.3–1.2)
Total Protein: 4.8 g/dL — ABNORMAL LOW (ref 6.5–8.1)

## 2022-09-17 LAB — URINALYSIS, ROUTINE W REFLEX MICROSCOPIC
Bilirubin Urine: NEGATIVE
Glucose, UA: NEGATIVE mg/dL
Hgb urine dipstick: NEGATIVE
Ketones, ur: NEGATIVE mg/dL
Leukocytes,Ua: NEGATIVE
Nitrite: NEGATIVE
Protein, ur: NEGATIVE mg/dL
Specific Gravity, Urine: 1.018 (ref 1.005–1.030)
pH: 5 (ref 5.0–8.0)

## 2022-09-17 LAB — APTT: aPTT: 34 seconds (ref 24–36)

## 2022-09-17 LAB — PROTIME-INR
INR: 1.4 — ABNORMAL HIGH (ref 0.8–1.2)
Prothrombin Time: 16.5 seconds — ABNORMAL HIGH (ref 11.4–15.2)

## 2022-09-17 LAB — LACTIC ACID, PLASMA
Lactic Acid, Venous: 1.7 mmol/L (ref 0.5–1.9)
Lactic Acid, Venous: 1.5 mmol/L (ref 0.5–1.9)

## 2022-09-17 LAB — SAMPLE TO BLOOD BANK

## 2022-09-17 MED ORDER — LACTATED RINGERS IV BOLUS (SEPSIS)
1000.0000 mL | Freq: Once | INTRAVENOUS | Status: AC
  Administered 2022-09-18: 1000 mL via INTRAVENOUS

## 2022-09-17 MED ORDER — SODIUM CHLORIDE 0.9 % IV SOLN
2.0000 g | Freq: Once | INTRAVENOUS | Status: AC
  Administered 2022-09-17: 2 g via INTRAVENOUS
  Filled 2022-09-17: qty 12.5

## 2022-09-17 MED ORDER — SODIUM CHLORIDE 0.9 % IV SOLN: 2.0000 g | Freq: Three times a day (TID) | INTRAVENOUS | Status: DC

## 2022-09-17 MED ORDER — EPOETIN ALFA 10000 UNIT/ML IJ SOLN
10000.0000 [IU] | Freq: Once | INTRAMUSCULAR | Status: AC
  Administered 2022-09-17: 10000 [IU] via SUBCUTANEOUS
  Filled 2022-09-17: qty 1

## 2022-09-17 MED ORDER — HEPARIN SOD (PORK) LOCK FLUSH 100 UNIT/ML IV SOLN: 500.0000 [IU] | Freq: Once | INTRAVENOUS | Status: DC

## 2022-09-17 MED ORDER — SODIUM CHLORIDE 0.9% FLUSH
10.0000 mL | Freq: Once | INTRAVENOUS | Status: AC
  Administered 2022-09-17: 10 mL via INTRAVENOUS

## 2022-09-17 MED ORDER — VANCOMYCIN HCL 1250 MG/250ML IV SOLN: 1250.0000 mg | INTRAVENOUS | Status: DC

## 2022-09-17 MED ORDER — SODIUM CHLORIDE 0.9% FLUSH: 10.0000 mL | Freq: Once | INTRAVENOUS | Status: DC

## 2022-09-17 MED ORDER — ALTEPLASE 2 MG IJ SOLR
2.0000 mg | Freq: Once | INTRAMUSCULAR | Status: AC
  Administered 2022-09-17: 2 mg
  Filled 2022-09-17: qty 2

## 2022-09-17 MED ORDER — METRONIDAZOLE 500 MG/100ML IV SOLN
500.0000 mg | Freq: Once | INTRAVENOUS | Status: AC
  Administered 2022-09-17: 500 mg via INTRAVENOUS
  Filled 2022-09-17: qty 100

## 2022-09-17 MED ORDER — VANCOMYCIN HCL IN DEXTROSE 1-5 GM/200ML-% IV SOLN
1000.0000 mg | Freq: Once | INTRAVENOUS | Status: AC
  Administered 2022-09-18: 1000 mg via INTRAVENOUS
  Filled 2022-09-17: qty 200

## 2022-09-17 MED ORDER — LACTATED RINGERS IV SOLN: INTRAVENOUS | Status: DC

## 2022-09-17 MED ORDER — LACTATED RINGERS IV BOLUS (SEPSIS)
1000.0000 mL | Freq: Once | INTRAVENOUS | Status: AC
  Administered 2022-09-17: 1000 mL via INTRAVENOUS

## 2022-09-17 NOTE — ED Triage Notes (Signed)
Pt arrived by EMS from home. Husband called concerned about pt having an increased fever and AMS.   Pt is oriented to Self and time, but a poor historian about herself and her care. Pt states that she has a limb restriction on her right arm but isnt able to tell me why. She also knows that she gets blood infusions but doents know when he last one was.   Pt has rectal temp of 104.6, Provider notified

## 2022-09-17 NOTE — Patient Instructions (Signed)
[facility]  Discharge Instructions: Thank you for choosing [facility] to provide your oncology and hematology care.  If you have a lab appointment with the [facility], please come in thru the Main Entrance and check in at the main information desk.  Wear comfortable clothing and clothing appropriate for easy access to any Portacath or PICC line.   We strive to give you quality time with your provider. You may need to reschedule your appointment if you arrive late (15 or more minutes).  Arriving late affects you and other patients whose appointments are after yours.  Also, if you miss three or more appointments without notifying the office, you may be dismissed from the clinic at the provider's discretion.      For prescription refill requests, have your pharmacy contact our office and allow 72 hours for refills to be completed.    Today you received the following chemotherapy and/or immunotherapy agents Retacrit, return as scheduled.   To help prevent nausea and vomiting after your treatment, we encourage you to take your nausea medication as directed.  BELOW ARE SYMPTOMS THAT SHOULD BE REPORTED IMMEDIATELY: *FEVER GREATER THAN 100.4 F (38 C) OR HIGHER *CHILLS OR SWEATING *NAUSEA AND VOMITING THAT IS NOT CONTROLLED WITH YOUR NAUSEA MEDICATION *UNUSUAL SHORTNESS OF BREATH *UNUSUAL BRUISING OR BLEEDING *URINARY PROBLEMS (pain or burning when urinating, or frequent urination) *BOWEL PROBLEMS (unusual diarrhea, constipation, pain near the anus) TENDERNESS IN MOUTH AND THROAT WITH OR WITHOUT PRESENCE OF ULCERS (sore throat, sores in mouth, or a toothache) UNUSUAL RASH, SWELLING OR PAIN  UNUSUAL VAGINAL DISCHARGE OR ITCHING   Items with * indicate a potential emergency and should be followed up as soon as possible or go to the Emergency Department if any problems should occur.  Please show the CHEMOTHERAPY ALERT CARD or IMMUNOTHERAPY ALERT CARD at  check-in to the Emergency Department and triage nurse.  Should you have questions after your visit or need to cancel or reschedule your appointment, please contact [facility] [phone]  and follow the prompts.  Office hours are 8:00 a.m. to 4:30 p.m. Monday - Friday. Please note that voicemails left after 4:00 p.m. may not be returned until the following business day.  We are closed weekends and major holidays. You have access to a nurse at all times for urgent questions. Please call the main number to the clinic [phone] and follow the prompts.  For any non-urgent questions, you may also contact your provider using MyChart. We now offer e-Visits for anyone 88 and older to request care online for non-urgent symptoms. For details visit [email or web address].   Also download the MyChart app! Go to the app store, search "MyChart", open the app, select [facility], and log in with your MyChart username and password.

## 2022-09-17 NOTE — Progress Notes (Signed)
Port flushed with no blood return noted, no bruising at site, small spots of skin irration around port from tape, skin intact. Alteplase given at 1340, no blood return in 1 hour. Per Dr. Delton Coombes give another dose of alteplase and have patient come back tomorrow. Patient made aware and appointment made. Patient discharged in satisfactory condition.

## 2022-09-17 NOTE — ED Provider Notes (Incomplete)
[facility] Provider Note   CSN: [id number] Arrival date & time: 09/17/22  2031     History {Add pertinent medical, surgical, social history, OB history to HPI:1} Chief Complaint  Patient presents with   Altered Mental Status   Fever    Rachel Huber is a 67 y.o. female.  Patient here with a complaint of not feeling well.  Fever to 104.6 pulse 102 blood pressure 104/60 respiration 17 oxygen sats 95%.  Patient was seen by hematology oncology today and had an infusion in her Port-A-Cath of Retacrit, this is the first time that she ever received that medication.  Patient is followed by them and last formally seen in the office on January 15.  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  Patient has a history of cirrhosis NASH type history of chronic headaches.  Patient's been receiving IV iron but did not receive any today because her hemoglobin was 7.1.  Patient also had some trouble with hemorrhoidal bleeding and in June had a coil embolization to help prevent bleeding from there.  Most recent CBC before today's was a hemoglobin of 6.5 on January 10.  She has had intermittent rectal bleeding which at times is severe.  Patient denies any bleeding the past couple days.  She also has chronic kidney disease and this is the reason why they started the Retacrit 10,000 units weekly.  Goal is to keep her ferritin levels above 200.  Due to the chronic GI blood loss in the core morbid chronic kidney disease goal hemoglobin is 9-10 due to the patient's underlying thalassemia.  Transfusion threshold is less than 7.0.  Patient has decompensated nonalcoholic liver cirrhosis followed at [facility] and by [facility].  Her ascites and liver cirrhosis requires intermittent paracentesis.  Recent EGD showed portal hypertension.  Patient had TIPS procedure on Dec 26, 2021 with revision on  March 12, 2022.  In summary patient's past medical history sniffing for cirrhosis of the liver diabetes without complications dyspnea of exertion gastroesophageal reflux disease grade 4 internal hemorrhoids hypertension diabetic neuropathy sleep apnea on CPAP and thalassemia minor.  Past surgical history significant for abdominal hysterectomy.  Patient went home after the Retacrit without any complaints.  By time she got home she started to feel very bad.       Home Medications Prior to Admission medications   Medication Sig Start Date End Date Taking? Authorizing Provider  acetaminophen (TYLENOL) 650 MG CR tablet Take 1,300 mg by mouth daily as needed for pain.    [provider]  albuterol (VENTOLIN HFA) 108 (90 Base) MCG/ACT inhaler Inhale 2 puffs into the lungs every 6 (six) hours as needed for wheezing or shortness of breath. 02/10/22   Loletha Grayer, MD  atorvastatin (LIPITOR) 10 MG tablet Take 1 tablet (10 mg total) by mouth daily. 06/20/22   Fritzi Mandes, MD  diphenhydrAMINE (BENADRYL) 25 MG tablet Take 25 mg by mouth 2 (two) times daily as needed for itching.    [provider]  ergocalciferol (VITAMIN D2) 1.25 MG (50000 UT) capsule Take 1 capsule (50,000 Units total) by mouth every 14 (fourteen) days. 09/01/22   Harriett Rush, PA-C  furosemide (LASIX) 40 MG tablet Take 40 mg by mouth daily. 06/24/22 06/24/23  [provider]  gabapentin (NEURONTIN) 400 MG capsule Take 800 mg by mouth 2 (two) times daily. 01/07/18  [provider]  gabapentin (NEURONTIN) 400 MG capsule Take 2 capsules by mouth 2 (two) times daily. 09/01/22   [provider]  hydrocortisone (ANUCORT-HC) 25 MG suppository Place 1 suppository (25 mg total) rectally 2 (two) times daily as needed for anal itching or hemorrhoids. 06/20/22   Fritzi Mandes, MD  hydrocortisone (ANUSOL-HC) 2.5 % rectal cream Place rectally 4 (four) times daily. 03/07/22   Loletha Grayer, MD   lactulose, encephalopathy, (CHRONULAC) 10 GM/15ML SOLN Take 30 mLs (20 g total) by mouth daily. 01/30/22   Roxan Hockey, MD  lidocaine-prilocaine (EMLA) cream Apply 1 Application topically See admin instructions for 1 dose. Apply to port site 1 hour prior to procedure 06/23/22   Narda Rutherford T, NP  melatonin 5 MG TABS Take 10 mg by mouth at bedtime as needed (sleep).    [provider]  Multiple Vitamins-Minerals (MULTIVITAMIN WITH MINERALS) tablet Take 1 tablet by mouth daily.    [provider]  polyethylene glycol (MIRALAX / GLYCOLAX) 17 g packet Take 17 g by mouth 2 (two) times daily. 06/20/22   Fritzi Mandes, MD  sertraline (ZOLOFT) 50 MG tablet Take 50 mg by mouth daily. 12/12/17   [provider]  sertraline (ZOLOFT) 50 MG tablet Take 1 tablet by mouth daily. 07/23/22   [provider]  vitamin B-12 (CYANOCOBALAMIN) 1000 MCG tablet Take 1,000 mcg by mouth daily.    [provider]  witch hazel-glycerin (TUCKS) pad Apply topically as needed for hemorrhoids. 03/07/22   Loletha Grayer, MD      Allergies    Gramineae pollens and Latex    Review of Systems   Review of Systems  Constitutional:  Positive for fever. Negative for chills.  HENT:  Negative for ear pain and sore throat.   Eyes:  Negative for pain and visual disturbance.  Respiratory:  Negative for cough and shortness of breath.   Cardiovascular:  Negative for chest pain and palpitations.  Gastrointestinal:  Negative for abdominal pain and vomiting.  Genitourinary:  Negative for dysuria and hematuria.  Musculoskeletal:  Positive for myalgias. Negative for arthralgias and back pain.  Skin:  Negative for color change and rash.  Neurological:  Positive for headaches. Negative for seizures and syncope.  All other systems reviewed and are negative.   Physical Exam Updated Vital Signs BP 104/60 (BP Location: Left Arm)   Pulse (!) 102   Temp (!) 104.6 F (40.3 C) (Rectal)   Resp (!)  22   Ht 1.727 m ('5\' 8"'$ )   Wt (!) 140 kg   SpO2 94%   BMI 46.93 kg/m  Physical Exam Vitals and nursing note reviewed.  Constitutional:      General: She is not in acute distress.    Appearance: She is well-developed.  HENT:     Head: Normocephalic and atraumatic.  Eyes:     Conjunctiva/sclera: Conjunctivae normal.  Cardiovascular:     Rate and Rhythm: Normal rate and regular rhythm.     Heart sounds: No murmur heard. Pulmonary:     Effort: Pulmonary effort is normal. No respiratory distress.     Breath sounds: Normal breath sounds.  Abdominal:     Palpations: Abdomen is soft.     Tenderness: There is no abdominal tenderness.  Musculoskeletal:        General: No swelling.     Cervical back: Neck supple.  Skin:    General: Skin is warm and dry.     Capillary Refill: Capillary refill takes  less than 2 seconds.  Neurological:     General: No focal deficit present.     Mental Status: She is alert.     Cranial Nerves: No cranial nerve deficit.     Sensory: No sensory deficit.     Motor: No weakness.     Coordination: Coordination normal.     Comments: Patient somewhat somnolent.  Psychiatric:        Mood and Affect: Mood normal.     ED Results / Procedures / Treatments   Labs (all labs ordered are listed, but only abnormal results are displayed) Labs Reviewed  COMPREHENSIVE METABOLIC PANEL - Abnormal; Notable for the following components:      Result Value   Sodium 134 (*)    CO2 20 (*)    Glucose, Bld 168 (*)    BUN 30 (*)    Creatinine, Ser 1.41 (*)    Calcium 7.9 (*)    Total Protein 4.8 (*)    Albumin 2.6 (*)    AST 53 (*)    Total Bilirubin 3.0 (*)    GFR, Estimated 42 (*)    All other components within normal limits  CBC WITH DIFFERENTIAL/PLATELET - Abnormal; Notable for the following components:   WBC 16.0 (*)    RBC 2.64 (*)    Hemoglobin 6.7 (*)    HCT 20.8 (*)    MCV 78.8 (*)    MCH 25.4 (*)    RDW 21.0 (*)    Platelets 123 (*)    Neutro Abs  14.9 (*)    Lymphs Abs 0.5 (*)    Abs Immature Granulocytes 0.09 (*)    All other components within normal limits  PROTIME-INR - Abnormal; Notable for the following components:   Prothrombin Time 16.5 (*)    INR 1.4 (*)    All other components within normal limits  URINALYSIS, ROUTINE W REFLEX MICROSCOPIC - Abnormal; Notable for the following components:   Color, Urine AMBER (*)    All other components within normal limits  CULTURE, BLOOD (ROUTINE X 2)  CULTURE, BLOOD (ROUTINE X 2)  RESP PANEL BY RT-PCR (RSV, FLU A&B, COVID)  RVPGX2  LACTIC ACID, PLASMA  LACTIC ACID, PLASMA    EKG None  Radiology DG Chest Port 1 View  Result Date: 09/17/2022 CLINICAL DATA:  Possible sepsis EXAM: PORTABLE CHEST 1 VIEW COMPARISON:  02/08/2022 FINDINGS: Cardiac shadow is mildly enlarged but stable. Right chest wall port is now seen in satisfactory position. Lungs are hypoinflated with crowding of the vascular markings. No focal infiltrate is seen. No bony abnormality is noted. IMPRESSION: No acute abnormality noted. Electronically Signed   By: Inez Catalina M.D.   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 She recently underwent a second TIPS revision 08/20/2022. Request received for therapeutic paracentesis with 8 L max. EXAM: ULTRASOUND GUIDED THERAPEUTIC RIGHT UPPER QUADRANT PARACENTESIS MEDICATIONS: 10 mL 1 % lidocaine COMPLICATIONS: None immediate. PROCEDURE: Informed written consent was obtained from the patient after a discussion of the risks, benefits and alternatives to treatment. A timeout was performed prior to the initiation of the procedure. Initial ultrasound scanning demonstrates a moderate amount of ascites within the right upper abdominal quadrant. The right upper abdomen was prepped and draped in the usual sterile fashion. 1%  lidocaine was used for local anesthesia. Following this, a  19 gauge,10-cm, Yueh catheter was introduced. An ultrasound image was saved for documentation purposes. The paracentesis was performed. The catheter was removed and a dressing was applied. The patient tolerated the procedure well without immediate post procedural complication. Patient received post-procedure intravenous albumin; see nursing notes for details. FINDINGS: A total of approximately 8 L of clear, yellow fluid was removed. IMPRESSION: Successful ultrasound-guided paracentesis yielding 8 liters of peritoneal fluid. Read by: Narda Rutherford, AGNP-BC Electronically Signed   By: Sandi Mariscal M.D.   On: 09/16/2022 12:39    Procedures Procedures  {Document cardiac monitor, telemetry assessment procedure when appropriate:1}  Medications Ordered in ED Medications - No data to display  ED Course/ Medical Decision Making/ A&P   {   Click here for ABCD2, HEART and other calculatorsREFRESH Note before signing :1}                          Medical Decision Making Amount and/or Complexity of Data Reviewed Labs: ordered. Radiology: ordered. ECG/medicine tests: ordered.  Risk Prescription drug management. Decision regarding hospitalization.   Patient's respiratory panel pending.  Urinalysis negative for urinary tract infection complete metabolic panel not exactly normal but patient is known to have a history of [place] cirrhosis.  Sodium 134 CO2 20 glucose 168 BUN 30 creatinine 1.41 calcium down some at 7.9.  Patient's GFR 42 but patient known to have chronic kidney disease.  Anion gap is normal at 7 lactic acid first 1 is 1.7.  White blood cell count markedly elevated at 16,000 hemoglobin 6.7 was 7.1 earlier today.  Platelets 123,000.  INR 1.4.  Blood cultures as stated are pending.  Patient with a little bit of like flulike complaints.  But everything was sudden onset.  The Retacrit does have side effects of fever chills cough feeling short of  breath.  Also can have joint pain muscle pain or spasm.  Patient has no nausea vomiting or diarrhea.  No cough or congestion.  And felt fine earlier today.  Patient has a lot of significant past medical problems.  And is at risk for infection because of her Port-A-Cath.  And because of her cirrhosis.  Patient's abdomen is soft and nontender.  CRITICAL CARE Performed by: Fredia Sorrow Total critical care time: 60 minutes Critical care time was exclusive of separately billable procedures and treating other patients. Critical care was necessary to treat or prevent imminent or life-threatening deterioration. Critical care was time spent personally by me on the following activities: development of treatment plan with patient and/or surrogate as well as nursing, discussions with consultants, evaluation of patient's response to treatment, examination of patient, obtaining history from patient or surrogate, ordering and performing treatments and interventions, ordering and review of laboratory studies, ordering and review of radiographic studies, pulse oximetry and re-evaluation of patient's condition.  Patient with a very good fever here and meeting some sepsis criteria based on white blood cell count fever tachycardia not hypotensive.  Sepsis order set initiated.  Will also get CT head chest abdomen and pelvis.  Today's GFR is good enough to do it with IV contrast.  Patient will need admission.  Final Clinical Impression(s) / ED Diagnoses Final diagnoses:  None    Rx / DC Orders ED Discharge Orders     None

## 2022-09-17 NOTE — Patient Instructions (Signed)
[facility]  Discharge Instructions: Thank you for choosing [facility] to provide your oncology and hematology care.  If you have a lab appointment with the [facility], please come in thru the Main Entrance and check in at the main information desk.  Wear comfortable clothing and clothing appropriate for easy access to any Portacath or PICC line.   We strive to give you quality time with your provider. You may need to reschedule your appointment if you arrive late (15 or more minutes).  Arriving late affects you and other patients whose appointments are after yours.  Also, if you miss three or more appointments without notifying the office, you may be dismissed from the clinic at the provider's discretion.      For prescription refill requests, have your pharmacy contact our office and allow 72 hours for refills to be completed.    Today you received the following alteplase was placed in port d/t no blood return.   To help prevent nausea and vomiting after your treatment, we encourage you to take your nausea medication as directed.  BELOW ARE SYMPTOMS THAT SHOULD BE REPORTED IMMEDIATELY: *FEVER GREATER THAN 100.4 F (38 C) OR HIGHER *CHILLS OR SWEATING *NAUSEA AND VOMITING THAT IS NOT CONTROLLED WITH YOUR NAUSEA MEDICATION *UNUSUAL SHORTNESS OF BREATH *UNUSUAL BRUISING OR BLEEDING *URINARY PROBLEMS (pain or burning when urinating, or frequent urination) *BOWEL PROBLEMS (unusual diarrhea, constipation, pain near the anus) TENDERNESS IN MOUTH AND THROAT WITH OR WITHOUT PRESENCE OF ULCERS (sore throat, sores in mouth, or a toothache) UNUSUAL RASH, SWELLING OR PAIN  UNUSUAL VAGINAL DISCHARGE OR ITCHING   Items with * indicate a potential emergency and should be followed up as soon as possible or go to the Emergency Department if any problems should occur.  Please show the CHEMOTHERAPY ALERT CARD or IMMUNOTHERAPY ALERT CARD at check-in to the Emergency  Department and triage nurse.  Should you have questions after your visit or need to cancel or reschedule your appointment, please contact [facility] [phone]  and follow the prompts.  Office hours are 8:00 a.m. to 4:30 p.m. Monday - Friday. Please note that voicemails left after 4:00 p.m. may not be returned until the following business day.  We are closed weekends and major holidays. You have access to a nurse at all times for urgent questions. Please call the main number to the clinic [phone] and follow the prompts.  For any non-urgent questions, you may also contact your provider using MyChart. We now offer e-Visits for anyone 58 and older to request care online for non-urgent symptoms. For details visit [email or web address].   Also download the MyChart app! Go to the app store, search "MyChart", open the app, select [facility], and log in with your MyChart username and password.

## 2022-09-17 NOTE — Progress Notes (Signed)
Pharmacy Antibiotic Note  Rachel Huber is a 67 y.o. female admitted on 09/17/2022 with sepsis.  Pharmacy has been consulted for vanc/cefepime dosing.  Pt presented with AMS and fever. Empiric vanc/cefepime ordered.   Scr 1.41  Plan: Vanc 2g IV x1 then 1.25g IV q24>>AUC 468, scr 1.41 Cefepime 2g IV q8 Level as needed  Height: '5\' 8"'$  (172.7 cm) Weight: (!) 140 kg (308 lb 10.3 oz) IBW/kg (Calculated) : 63.9  Temp (24hrs), Avg:102.1 F (38.9 C), Min:99.5 F (37.5 C), Max:104.6 F (40.3 C)  Recent Labs  Lab 09/17/22 1322 09/17/22 2111  WBC 4.7 16.0*  CREATININE  --  1.41*  LATICACIDVEN  --  1.7    Estimated Creatinine Clearance: 60 mL/min (A) (by C-G formula based on SCr of 1.41 mg/dL (H)).    Allergies  Allergen Reactions   Gramineae Pollens Other (See Comments)    Sneezing, Running nose   Latex Rash    Contact rash    Antimicrobials this admission: 1/31 vanc>> 1/31 cefepime>>  Dose adjustments this admission:   Microbiology results: 1/31 blood>>  Onnie Boer, PharmD, [facility], AAHIVP, CPP Infectious Disease Pharmacist 09/17/2022 11:32 PM

## 2022-09-17 NOTE — Sepsis Progress Note (Signed)
Following per sepsis protocol  ? ?

## 2022-09-17 NOTE — Progress Notes (Signed)
Patient presents today for Retacrit injection. Hemoglobin reviewed prior to administration. VSS tolerated without incident or complaint. See MAR for details. Patient stable during and after injection. Patient discharged in satisfactory condition with no s/s of distress noted.  

## 2022-09-18 ENCOUNTER — Other Ambulatory Visit: Payer: 59

## 2022-09-18 ENCOUNTER — Emergency Department (HOSPITAL_COMMUNITY): Payer: 59

## 2022-09-18 ENCOUNTER — Inpatient Hospital Stay: Payer: 59

## 2022-09-18 ENCOUNTER — Encounter (HOSPITAL_COMMUNITY): Payer: Self-pay | Admitting: Family Medicine

## 2022-09-18 DIAGNOSIS — R7881 Bacteremia: Secondary | ICD-10-CM | POA: Diagnosis not present

## 2022-09-18 DIAGNOSIS — N1832 Chronic kidney disease, stage 3b: Secondary | ICD-10-CM

## 2022-09-18 DIAGNOSIS — E1142 Type 2 diabetes mellitus with diabetic polyneuropathy: Secondary | ICD-10-CM | POA: Diagnosis not present

## 2022-09-18 DIAGNOSIS — R4182 Altered mental status, unspecified: Secondary | ICD-10-CM | POA: Diagnosis not present

## 2022-09-18 DIAGNOSIS — E1140 Type 2 diabetes mellitus with diabetic neuropathy, unspecified: Secondary | ICD-10-CM | POA: Diagnosis not present

## 2022-09-18 DIAGNOSIS — A411 Sepsis due to other specified staphylococcus: Secondary | ICD-10-CM | POA: Diagnosis not present

## 2022-09-18 DIAGNOSIS — E1169 Type 2 diabetes mellitus with other specified complication: Secondary | ICD-10-CM | POA: Diagnosis not present

## 2022-09-18 DIAGNOSIS — K449 Diaphragmatic hernia without obstruction or gangrene: Secondary | ICD-10-CM | POA: Diagnosis not present

## 2022-09-18 DIAGNOSIS — T80211A Bloodstream infection due to central venous catheter, initial encounter: Secondary | ICD-10-CM | POA: Diagnosis not present

## 2022-09-18 DIAGNOSIS — I129 Hypertensive chronic kidney disease with stage 1 through stage 4 chronic kidney disease, or unspecified chronic kidney disease: Secondary | ICD-10-CM | POA: Diagnosis not present

## 2022-09-18 DIAGNOSIS — D631 Anemia in chronic kidney disease: Secondary | ICD-10-CM | POA: Diagnosis not present

## 2022-09-18 DIAGNOSIS — J9811 Atelectasis: Secondary | ICD-10-CM | POA: Diagnosis not present

## 2022-09-18 DIAGNOSIS — E785 Hyperlipidemia, unspecified: Secondary | ICD-10-CM | POA: Diagnosis present

## 2022-09-18 DIAGNOSIS — I081 Rheumatic disorders of both mitral and tricuspid valves: Secondary | ICD-10-CM | POA: Diagnosis not present

## 2022-09-18 DIAGNOSIS — D508 Other iron deficiency anemias: Secondary | ICD-10-CM | POA: Diagnosis not present

## 2022-09-18 DIAGNOSIS — E1122 Type 2 diabetes mellitus with diabetic chronic kidney disease: Secondary | ICD-10-CM | POA: Diagnosis not present

## 2022-09-18 DIAGNOSIS — K7460 Unspecified cirrhosis of liver: Secondary | ICD-10-CM | POA: Diagnosis not present

## 2022-09-18 DIAGNOSIS — D62 Acute posthemorrhagic anemia: Secondary | ICD-10-CM

## 2022-09-18 DIAGNOSIS — I700 Atherosclerosis of aorta: Secondary | ICD-10-CM | POA: Diagnosis not present

## 2022-09-18 DIAGNOSIS — K909 Intestinal malabsorption, unspecified: Secondary | ICD-10-CM | POA: Diagnosis not present

## 2022-09-18 DIAGNOSIS — A419 Sepsis, unspecified organism: Secondary | ICD-10-CM | POA: Diagnosis not present

## 2022-09-18 DIAGNOSIS — R509 Fever, unspecified: Secondary | ICD-10-CM | POA: Diagnosis not present

## 2022-09-18 DIAGNOSIS — Z1152 Encounter for screening for COVID-19: Secondary | ICD-10-CM | POA: Diagnosis not present

## 2022-09-18 DIAGNOSIS — E538 Deficiency of other specified B group vitamins: Secondary | ICD-10-CM | POA: Diagnosis not present

## 2022-09-18 DIAGNOSIS — K7031 Alcoholic cirrhosis of liver with ascites: Secondary | ICD-10-CM | POA: Diagnosis not present

## 2022-09-18 DIAGNOSIS — I361 Nonrheumatic tricuspid (valve) insufficiency: Secondary | ICD-10-CM | POA: Diagnosis not present

## 2022-09-18 DIAGNOSIS — Z6841 Body Mass Index (BMI) 40.0 and over, adult: Secondary | ICD-10-CM | POA: Diagnosis not present

## 2022-09-18 DIAGNOSIS — Z8616 Personal history of COVID-19: Secondary | ICD-10-CM | POA: Diagnosis not present

## 2022-09-18 DIAGNOSIS — N179 Acute kidney failure, unspecified: Secondary | ICD-10-CM | POA: Diagnosis not present

## 2022-09-18 DIAGNOSIS — Y848 Other medical procedures as the cause of abnormal reaction of the patient, or of later complication, without mention of misadventure at the time of the procedure: Secondary | ICD-10-CM | POA: Diagnosis not present

## 2022-09-18 DIAGNOSIS — D696 Thrombocytopenia, unspecified: Secondary | ICD-10-CM | POA: Diagnosis not present

## 2022-09-18 DIAGNOSIS — R6510 Systemic inflammatory response syndrome (SIRS) of non-infectious origin without acute organ dysfunction: Secondary | ICD-10-CM

## 2022-09-18 DIAGNOSIS — I340 Nonrheumatic mitral (valve) insufficiency: Secondary | ICD-10-CM | POA: Diagnosis not present

## 2022-09-18 DIAGNOSIS — R188 Other ascites: Secondary | ICD-10-CM | POA: Diagnosis not present

## 2022-09-18 DIAGNOSIS — F32A Depression, unspecified: Secondary | ICD-10-CM | POA: Diagnosis not present

## 2022-09-18 DIAGNOSIS — K766 Portal hypertension: Secondary | ICD-10-CM | POA: Diagnosis not present

## 2022-09-18 DIAGNOSIS — K8020 Calculus of gallbladder without cholecystitis without obstruction: Secondary | ICD-10-CM | POA: Diagnosis not present

## 2022-09-18 DIAGNOSIS — D561 Beta thalassemia: Secondary | ICD-10-CM | POA: Diagnosis not present

## 2022-09-18 LAB — BLOOD GAS, VENOUS
Acid-base deficit: 2.4 mmol/L — ABNORMAL HIGH (ref 0.0–2.0)
Bicarbonate: 21.6 mmol/L (ref 20.0–28.0)
Drawn by: 59595
O2 Saturation: 54 %
Patient temperature: 40.3
pCO2, Ven: 39 mmHg — ABNORMAL LOW (ref 44–60)
pH, Ven: 7.36 (ref 7.25–7.43)
pO2, Ven: 41 mmHg (ref 32–45)

## 2022-09-18 LAB — APTT: aPTT: 35 seconds (ref 24–36)

## 2022-09-18 LAB — PROTIME-INR
INR: 1.4 — ABNORMAL HIGH (ref 0.8–1.2)
Prothrombin Time: 17.4 seconds — ABNORMAL HIGH (ref 11.4–15.2)

## 2022-09-18 LAB — BLOOD CULTURE ID PANEL (REFLEXED) - BCID2

## 2022-09-18 LAB — GLUCOSE, PLEURAL OR PERITONEAL FLUID: Glucose, Fluid: 181 mg/dL

## 2022-09-18 LAB — ALBUMIN, PLEURAL OR PERITONEAL FLUID: Albumin, Fluid: <1.5 g/dL

## 2022-09-18 LAB — GRAM STAIN

## 2022-09-18 LAB — PROTEIN, PLEURAL OR PERITONEAL FLUID: Total protein, fluid: <3 g/dL

## 2022-09-18 LAB — LACTATE DEHYDROGENASE, PLEURAL OR PERITONEAL FLUID: LD, Fluid: 77 U/L — ABNORMAL HIGH (ref 3–23)

## 2022-09-18 LAB — PREPARE RBC (CROSSMATCH)

## 2022-09-18 LAB — CORTISOL: Cortisol, Plasma: 14 ug/dL

## 2022-09-18 LAB — LACTIC ACID, PLASMA
Lactic Acid, Venous: 0.6 mmol/L (ref 0.5–1.9)
Lactic Acid, Venous: 1.1 mmol/L (ref 0.5–1.9)

## 2022-09-18 LAB — MAGNESIUM: Magnesium: 2.1 mg/dL (ref 1.7–2.4)

## 2022-09-18 LAB — PROCALCITONIN: Procalcitonin: 6.4 ng/mL

## 2022-09-18 LAB — PHOSPHORUS: Phosphorus: 3.3 mg/dL (ref 2.5–4.6)

## 2022-09-18 MED ORDER — FLEET ENEMA 7-19 GM/118ML RE ENEM: 1.0000 | ENEMA | Freq: Once | RECTAL | Status: DC | PRN

## 2022-09-18 MED ORDER — ACETAMINOPHEN 650 MG RE SUPP: 650.0000 mg | Freq: Four times a day (QID) | RECTAL | Status: DC | PRN

## 2022-09-18 MED ORDER — TRAZODONE HCL 50 MG PO TABS
25.0000 mg | ORAL_TABLET | Freq: Every evening | ORAL | Status: DC | PRN
  Filled 2022-09-18: qty 1

## 2022-09-18 MED ORDER — SODIUM CHLORIDE 0.9 % IV SOLN: 2.0000 g | Freq: Once | INTRAVENOUS | Status: DC

## 2022-09-18 MED ORDER — ONDANSETRON HCL 4 MG/2ML IJ SOLN
4.0000 mg | Freq: Once | INTRAMUSCULAR | Status: AC
  Administered 2022-09-18: 4 mg via INTRAVENOUS
  Filled 2022-09-18: qty 2

## 2022-09-18 MED ORDER — SODIUM CHLORIDE 0.9% IV SOLUTION: Freq: Once | INTRAVENOUS | Status: AC

## 2022-09-18 MED ORDER — VITAMIN B-12 1000 MCG PO TABS
1000.0000 ug | ORAL_TABLET | Freq: Every day | ORAL | Status: DC
  Administered 2022-09-18 – 2022-09-24 (×6): 1000 ug via ORAL
  Filled 2022-09-18 (×7): qty 1

## 2022-09-18 MED ORDER — ATORVASTATIN CALCIUM 10 MG PO TABS
10.0000 mg | ORAL_TABLET | Freq: Every day | ORAL | Status: DC
  Administered 2022-09-18 – 2022-09-24 (×6): 10 mg via ORAL
  Filled 2022-09-18 (×7): qty 1

## 2022-09-18 MED ORDER — GABAPENTIN 400 MG PO CAPS
800.0000 mg | ORAL_CAPSULE | Freq: Two times a day (BID) | ORAL | Status: DC
  Administered 2022-09-18 – 2022-09-24 (×12): 800 mg via ORAL
  Filled 2022-09-18 (×13): qty 2

## 2022-09-18 MED ORDER — OXYCODONE HCL 5 MG PO TABS: 5.0000 mg | ORAL_TABLET | ORAL | Status: DC | PRN

## 2022-09-18 MED ORDER — HYDRALAZINE HCL 20 MG/ML IJ SOLN: 10.0000 mg | INTRAMUSCULAR | Status: DC | PRN

## 2022-09-18 MED ORDER — LEVALBUTEROL HCL 0.63 MG/3ML IN NEBU: 0.6300 mg | INHALATION_SOLUTION | Freq: Four times a day (QID) | RESPIRATORY_TRACT | Status: DC | PRN

## 2022-09-18 MED ORDER — HYDROMORPHONE HCL 1 MG/ML IJ SOLN: 0.5000 mg | INTRAMUSCULAR | Status: DC | PRN

## 2022-09-18 MED ORDER — SODIUM CHLORIDE 0.9% FLUSH: 3.0000 mL | Freq: Two times a day (BID) | INTRAVENOUS | Status: DC

## 2022-09-18 MED ORDER — LACTULOSE 10 GM/15ML PO SOLN
20.0000 g | Freq: Every day | ORAL | Status: DC
  Administered 2022-09-18: 20 g via ORAL
  Filled 2022-09-18 (×10): qty 30

## 2022-09-18 MED ORDER — SENNOSIDES-DOCUSATE SODIUM 8.6-50 MG PO TABS: 1.0000 | ORAL_TABLET | Freq: Every evening | ORAL | Status: DC | PRN

## 2022-09-18 MED ORDER — ONDANSETRON HCL 4 MG PO TABS: 4.0000 mg | ORAL_TABLET | Freq: Four times a day (QID) | ORAL | Status: DC | PRN

## 2022-09-18 MED ORDER — BISACODYL 5 MG PO TBEC: 5.0000 mg | DELAYED_RELEASE_TABLET | Freq: Every day | ORAL | Status: DC | PRN

## 2022-09-18 MED ORDER — HEPARIN SODIUM (PORCINE) 5000 UNIT/ML IJ SOLN: 5000.0000 [IU] | Freq: Three times a day (TID) | INTRAMUSCULAR | Status: DC

## 2022-09-18 MED ORDER — LACTATED RINGERS IV BOLUS (SEPSIS): 1000.0000 mL | Freq: Once | INTRAVENOUS | Status: DC

## 2022-09-18 MED ORDER — DIPHENHYDRAMINE HCL 25 MG PO CAPS
25.0000 mg | ORAL_CAPSULE | Freq: Two times a day (BID) | ORAL | Status: DC | PRN
  Administered 2022-09-22: 25 mg via ORAL
  Filled 2022-09-18: qty 1

## 2022-09-18 MED ORDER — LIDOCAINE-EPINEPHRINE (PF) 2 %-1:200000 IJ SOLN
INTRAMUSCULAR | Status: AC
  Filled 2022-09-18: qty 20

## 2022-09-18 MED ORDER — ONDANSETRON HCL 4 MG/2ML IJ SOLN: 4.0000 mg | Freq: Four times a day (QID) | INTRAMUSCULAR | Status: DC | PRN

## 2022-09-18 MED ORDER — SODIUM CHLORIDE 0.9 % IV SOLN
2.0000 g | Freq: Three times a day (TID) | INTRAVENOUS | Status: DC
  Administered 2022-09-18 – 2022-09-19 (×4): 2 g via INTRAVENOUS
  Filled 2022-09-18 (×4): qty 12.5

## 2022-09-18 MED ORDER — CHLORHEXIDINE GLUCONATE CLOTH 2 % EX PADS
6.0000 | MEDICATED_PAD | Freq: Every day | CUTANEOUS | Status: DC
  Administered 2022-09-18 – 2022-09-24 (×6): 6 via TOPICAL

## 2022-09-18 MED ORDER — METRONIDAZOLE 500 MG/100ML IV SOLN
500.0000 mg | Freq: Two times a day (BID) | INTRAVENOUS | Status: DC
  Administered 2022-09-18 – 2022-09-19 (×4): 500 mg via INTRAVENOUS
  Filled 2022-09-18 (×4): qty 100

## 2022-09-18 MED ORDER — TRAMADOL HCL 50 MG PO TABS: 50.0000 mg | ORAL_TABLET | Freq: Four times a day (QID) | ORAL | Status: DC | PRN

## 2022-09-18 MED ORDER — VANCOMYCIN HCL 1750 MG/350ML IV SOLN
1750.0000 mg | INTRAVENOUS | Status: DC
  Administered 2022-09-19 – 2022-09-20 (×3): 1750 mg via INTRAVENOUS
  Filled 2022-09-18 (×5): qty 350

## 2022-09-18 MED ORDER — SODIUM CHLORIDE 0.9% FLUSH
3.0000 mL | Freq: Two times a day (BID) | INTRAVENOUS | Status: DC
  Administered 2022-09-18 – 2022-09-24 (×9): 3 mL via INTRAVENOUS

## 2022-09-18 MED ORDER — IOHEXOL 300 MG/ML  SOLN
100.0000 mL | Freq: Once | INTRAMUSCULAR | Status: AC | PRN
  Administered 2022-09-18: 100 mL via INTRAVENOUS

## 2022-09-18 MED ORDER — VANCOMYCIN HCL IN DEXTROSE 1-5 GM/200ML-% IV SOLN: 1000.0000 mg | Freq: Once | INTRAVENOUS | Status: DC

## 2022-09-18 MED ORDER — ALBUTEROL SULFATE HFA 108 (90 BASE) MCG/ACT IN AERS: 2.0000 | INHALATION_SPRAY | Freq: Four times a day (QID) | RESPIRATORY_TRACT | Status: DC | PRN

## 2022-09-18 MED ORDER — ACETAMINOPHEN 10 MG/ML IV SOLN
1000.0000 mg | Freq: Once | INTRAVENOUS | Status: AC
  Administered 2022-09-18: 1000 mg via INTRAVENOUS
  Filled 2022-09-18: qty 100

## 2022-09-18 MED ORDER — SERTRALINE HCL 50 MG PO TABS
50.0000 mg | ORAL_TABLET | Freq: Every day | ORAL | Status: DC
  Administered 2022-09-18 – 2022-09-24 (×6): 50 mg via ORAL
  Filled 2022-09-18 (×7): qty 1

## 2022-09-18 MED ORDER — IPRATROPIUM BROMIDE 0.02 % IN SOLN: 0.5000 mg | Freq: Four times a day (QID) | RESPIRATORY_TRACT | Status: DC | PRN

## 2022-09-18 MED ORDER — ACETAMINOPHEN 325 MG PO TABS: 650.0000 mg | ORAL_TABLET | Freq: Four times a day (QID) | ORAL | Status: DC | PRN

## 2022-09-18 MED ORDER — ACETAMINOPHEN 325 MG PO TABS
650.0000 mg | ORAL_TABLET | Freq: Four times a day (QID) | ORAL | Status: DC | PRN
  Administered 2022-09-23 – 2022-09-24 (×2): 650 mg via ORAL
  Filled 2022-09-18 (×2): qty 2

## 2022-09-18 MED ORDER — MIDODRINE HCL 5 MG PO TABS
10.0000 mg | ORAL_TABLET | Freq: Three times a day (TID) | ORAL | Status: DC
  Administered 2022-09-18 – 2022-09-24 (×19): 10 mg via ORAL
  Filled 2022-09-18 (×20): qty 2

## 2022-09-18 NOTE — Evaluation (Signed)
Physical Therapy Evaluation Patient Details Name: RONIYAH LLORENS MRN: [id number] DOB: [birth year] Today's Date: 09/18/2022  History of Present Illness  Rachel Huber is a 67 year old female with significant history of beta thalassemia minor (status post first treatment with IV Retacrit,on 1/31), aslo H/o cirrhosis NASH,with paracentesis 1/30, CKD 3B, HLD, GERD, internal hemorrhoids, OSA-CPAP, anxiety/depression, chronic anemia -with multiple PRBC transfusion, chronic hemorrhoid with bleeds, insomnia, ....        On 09/17/2022 patient received IV Retacrit through her port a cath in oncology office for her beta thalassemia, also has received iron infusion secondary to chronic anemia, also had paracentesis 09/16/2022.   Proximately 45 minutes after treatment with Retacrit follow-up fever 104.6, was tachycardic with a pulse of 102, blood pressure 104/60 with RR of 17, O2 sat of 95%     Of note patient has chronic ascites, [name] liver cirrhosis requiring intermittent paracentesis, recent EGD revealed portal hypertension, patient had TIPS procedure on Dec 26, 2021 revision in July 2026 2023.   Clinical Impression  Patient had to use side rail for completing supine to sitting with slow labored movement, once seated had no c/o dizziness and able to take a few side steps, steps forward/backward at bedside before having to sit due to fatigue.  Patient put back to bed with fair/good return for repositioning self and c/o minor low back pain.  Patient will benefit from continued skilled physical therapy in hospital and recommended venue below to increase strength, balance, endurance for safe ADLs and gait.        Recommendations for follow up therapy are one component of a multi-disciplinary discharge planning process, led by the attending physician.  Recommendations may be updated based on patient status, additional functional criteria and insurance authorization.  Follow Up Recommendations Home health PT       Assistance Recommended at Discharge Set up Supervision/Assistance  Patient can return home with the following  A little help with walking and/or transfers;A little help with bathing/dressing/bathroom;Help with stairs or ramp for entrance;Assistance with cooking/housework    Equipment Recommendations None recommended by PT  Recommendations for Other Services       Functional Status Assessment Patient has had a recent decline in their functional status and demonstrates the ability to make significant improvements in function in a reasonable and predictable amount of time.     Precautions / Restrictions Precautions Precautions: Fall Restrictions Weight Bearing Restrictions: No      Mobility  Bed Mobility Overal bed mobility: Needs Assistance Bed Mobility: Supine to Sit, Sit to Supine     Supine to sit: Min assist Sit to supine: Min assist   General bed mobility comments: increased time, labored movement, required use of bed rail    Transfers Overall transfer level: Needs assistance Equipment used: Rolling walker (2 wheels) Transfers: Sit to/from Stand Sit to Stand: Min assist           General transfer comment: increased time, labored movement    Ambulation/Gait Ambulation/Gait assistance: Min assist, Mod assist Gait Distance (Feet): 6 Feet Assistive device: Rolling walker (2 wheels) Gait Pattern/deviations: Decreased step length - right, Decreased step length - left, Decreased stride length Gait velocity: decreased     General Gait Details: limited to a few unsteady labored side steps, steps forward/backward without loss of balance, but limited due to fatigue  Stairs            Wheelchair Mobility    Modified Rankin (Stroke Patients Only)  Balance Overall balance assessment: Needs assistance Sitting-balance support: Feet supported, No upper extremity supported Sitting balance-Leahy Scale: Fair Sitting balance - Comments: fair/good seated at  EOB   Standing balance support: During functional activity, No upper extremity supported Standing balance-Leahy Scale: Poor Standing balance comment: fair/poor using RW                             Pertinent Vitals/Pain Pain Assessment Pain Assessment: Faces Faces Pain Scale: Hurts little more Pain Location: low back during bed mobility Pain Descriptors / Indicators: Sore Pain Intervention(s): Limited activity within patient's tolerance, Monitored during session, Repositioned    Home Living Family/patient expects to be discharged to:: Private residence Living Arrangements: Children;Spouse/significant other Available Help at Discharge: Family;Available 24 hours/day Type of Home: House Home Access: Stairs to enter Entrance Stairs-Rails: None Entrance Stairs-Number of Steps: 2 steps into [employer] Layout: One level Home Equipment: Conservation officer, nature (2 wheels);Adaptive equipment;Cane - single point      Prior Function Prior Level of Function : Independent/Modified Independent             Mobility Comments: household ambulator using RW ADLs Comments: Assisted by family     Hand Dominance   Dominant Hand: Right    Extremity/Trunk Assessment   Upper Extremity Assessment Upper Extremity Assessment: Defer to OT evaluation    Lower Extremity Assessment Lower Extremity Assessment: Generalized weakness    Cervical / Trunk Assessment Cervical / Trunk Assessment: Normal  Communication   Communication: No difficulties  Cognition Arousal/Alertness: Awake/alert Behavior During Therapy: WFL for tasks assessed/performed Overall Cognitive Status: Within Functional Limits for tasks assessed                                          General Comments      Exercises     Assessment/Plan    PT Assessment Patient needs continued PT services  PT Problem List Decreased strength;Decreased activity tolerance;Decreased balance;Decreased mobility        PT Treatment Interventions DME instruction;Gait training;Stair training;Functional mobility training;Therapeutic activities;Therapeutic exercise;Patient/family education;Balance training    PT Goals (Current goals can be found in the Care Plan section)  Acute Rehab PT Goals Patient Stated Goal: return home with family to assist PT Goal Formulation: With patient Time For Goal Achievement: 09/25/22 Potential to Achieve Goals: Good    Frequency Min 3X/week     Co-evaluation               AM-PAC PT "6 Clicks" Mobility  Outcome Measure Help needed turning from your back to your side while in a flat bed without using bedrails?: A Little Help needed moving from lying on your back to sitting on the side of a flat bed without using bedrails?: A Little Help needed moving to and from a bed to a chair (including a wheelchair)?: A Little Help needed standing up from a chair using your arms (e.g., wheelchair or bedside chair)?: A Little Help needed to walk in hospital room?: A Lot Help needed climbing 3-5 steps with a railing? : A Lot 6 Click Score: 16    End of Session   Activity Tolerance: Patient tolerated treatment well;Patient limited by fatigue Patient left: in bed;with call bell/phone within reach Nurse Communication: Mobility status PT Visit Diagnosis: Unsteadiness on feet (R26.81);Other abnormalities of gait and mobility (R26.89);Muscle weakness (  generalized) (M62.81)    Time: 7169-6789 PT Time Calculation (min) (ACUTE ONLY): 23 min   Charges:   PT Evaluation $PT Eval Moderate Complexity: 1 Mod PT Treatments $Therapeutic Activity: 23-37 mins        3:11 PM, 09/18/22 Lonell Grandchild, MPT Physical Therapist with [facility] [phone] office [phone] mobile phone

## 2022-09-18 NOTE — Assessment & Plan Note (Addendum)
-  Close follow-up with oncologist -Status post first treatment with IV Retacrit, on 09/17/2022 -Post infusion reaction with fever -Discussed the case with Dr. 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

## 2022-09-18 NOTE — Consult Note (Signed)
[facility] Consultation Oncology  Name: Rachel Huber      MRN: [id number]    Location: A315/A315-01  Date: 09/18/2022 Time:4:52 PM   REFERRING PHYSICIAN: Dr. Roger Shelter  REASON FOR CONSULT: Severe microcytic anemia   DIAGNOSIS: Anemia from blood loss  HISTORY OF PRESENT ILLNESS: [name] is a 67 year old white female who is seen in consultation today at the request of Dr. Roger Shelter.  She has a history of beta thalassemia minor, history of cirrhosis (NASH), CKD stage IIIb, internal hemorrhoids, chronic anemia with multiple transfusion requirement, chronic bleeding from internal hemorrhoids in the setting of portal hypertension, status post inferior mesenteric angiogram, particular and coil embolization of duplicated right superior rectal artery, coil embolization of the left superior rectal artery on 09/05/2022.  She has received Retacrit injection yesterday.  She also received alteplase for lack of blood return from the port.  She presented yesterday to the ER with fever of 104.6 and altered mental status.  PAST MEDICAL HISTORY:   Past Medical History:  Diagnosis Date   Anemia in chronic kidney disease (CKD) 02/07/2022   Anxiety    Arthritis    Cirrhosis of liver ([place])    Diabetes mellitus without complication (HCC)    Dyspnea    DOE   GERD (gastroesophageal reflux disease)    Grade IV internal hemorrhoids    Contingency plan for any future admissions for severe anemia in the setting of persistent GI bleed:  nuclear medicine tagged RBC scan to assess for location of GI bleeding - if from rectum, would then proceed to angiogram with possible repeat embolization.  Please notify IR in this case.   Hepatitis    PAST   Hypertension    Neuropathy    Neuropathy, diabetic ([place])    Pneumonia    Sinus complaint    Sleep apnea    CPAP   Thalassemia minor 1992    ALLERGIES: Allergies  Allergen Reactions   Gramineae Pollens Other (See Comments)    Sneezing, Running nose    Latex Rash    Contact rash      MEDICATIONS: I have reviewed the patient's current medications.     PAST SURGICAL HISTORY Past Surgical History:  Procedure Laterality Date   ABDOMINAL HYSTERECTOMY     BREAST BIOPSY Right 02/15/2018   [place] bx 6-6:30 ribbon shape, ONE CORE FRAGMENT WITH FIBROSIS. ONE CORE FRAGMENT WITH PORTION OF A DILATED   BREAST BIOPSY Right 02/15/2018   [place] bx 9:00 heart shape, USUAL DUCTAL HYPERPLASIA   BREAST LUMPECTOMY Right 03/09/2018   Procedure: BREAST LUMPECTOMY x 2;  Surgeon: Benjamine Sprague, DO;  Location: [facility] ORS;  Service: General;  Laterality: Right;   CATARACT EXTRACTION W/PHACO Left 06/11/2016   Procedure: CATARACT EXTRACTION PHACO AND INTRAOCULAR LENS PLACEMENT (IOC);  Surgeon: Estill Cotta, MD;  Location: [facility] ORS;  Service: Ophthalmology;  Laterality: Left;  Lot # [id number] H US:01:38.6 AP%:26.4 CDE:44.15   CATARACT EXTRACTION W/PHACO Right 06/15/2018   Procedure: CATARACT EXTRACTION PHACO AND INTRAOCULAR LENS PLACEMENT (IOC);  Surgeon: Birder Robson, MD;  Location: [facility] ORS;  Service: Ophthalmology;  Laterality: Right;  [place] 00:38.2 CDE 4.23 Fluid Pack Lot # [id number]   COLONOSCOPIES     COLONOSCOPY WITH PROPOFOL N/A 10/10/2020   Procedure: COLONOSCOPY WITH PROPOFOL;  Surgeon: Lesly Rubenstein, MD;  Location: [facility] ENDOSCOPY;  Service: Endoscopy;  Laterality: N/A;   COLONOSCOPY WITH PROPOFOL N/A 11/20/2020   Procedure: COLONOSCOPY WITH PROPOFOL;  Surgeon: Lesly Rubenstein, MD;  Location: [facility] ENDOSCOPY;  Service: Endoscopy;  Laterality: N/A;  DM STAT CBC, BMP COVID POSITIVE 09/02/2020   COLONOSCOPY WITH PROPOFOL N/A 06/19/2022   Procedure: COLONOSCOPY WITH PROPOFOL;  Surgeon: Lesly Rubenstein, MD;  Location: [facility] ENDOSCOPY;  Service: Endoscopy;  Laterality: N/A;   CTR     ESOPHAGOGASTRODUODENOSCOPY (EGD) WITH PROPOFOL N/A 10/10/2020   Procedure: ESOPHAGOGASTRODUODENOSCOPY (EGD) WITH PROPOFOL;  Surgeon: Lesly Rubenstein, MD;  Location: [facility]  ENDOSCOPY;  Service: Endoscopy;  Laterality: N/A;  COVID POSITIVE 10/08/2020   FLEXIBLE SIGMOIDOSCOPY N/A 02/06/2022   Procedure: FLEXIBLE SIGMOIDOSCOPY;  Surgeon: Lesly Rubenstein, MD;  Location: [facility] ENDOSCOPY;  Service: Endoscopy;  Laterality: N/A;  Patient requests anesthesia   IR ANGIOGRAM SELECTIVE EACH ADDITIONAL VESSEL  02/07/2022   IR ANGIOGRAM SELECTIVE EACH ADDITIONAL VESSEL  02/07/2022   IR ANGIOGRAM SELECTIVE EACH ADDITIONAL VESSEL  09/05/2022   IR ANGIOGRAM VISCERAL SELECTIVE  02/07/2022   IR ANGIOGRAM VISCERAL SELECTIVE  09/05/2022   IR EMBO ART  VEN HEMORR LYMPH EXTRAV  INC GUIDE ROADMAPPING  09/05/2022   IR EMBO ARTERIAL NOT HEMORR HEMANG INC GUIDE ROADMAPPING  02/07/2022   IR IMAGING GUIDED PORT INSERTION  06/13/2022   IR PARACENTESIS  12/17/2021   IR PARACENTESIS  03/25/2022   IR RADIOLOGIST EVAL & MGMT  03/18/2022   IR RADIOLOGIST EVAL & MGMT  05/07/2022   IR RADIOLOGIST EVAL & MGMT  05/16/2022   IR US GUIDE VASC ACCESS RIGHT  02/07/2022   IR US GUIDE VASC ACCESS RIGHT  09/05/2022   JOINT REPLACEMENT     KNEE SURGERY Right 09/24/2017   plates and pins   TOTAL SHOULDER ARTHROPLASTY Right 09/27/2015    FAMILY HISTORY: Family History  Problem Relation Age of Onset   Breast cancer Mother 70   Lymphoma Mother    Diabetes Father    Kidney cancer Father    Heart disease Father    Diabetes Sister    Breast cancer Sister 75    SOCIAL HISTORY:  reports that she has never smoked. She has never used smokeless tobacco. She reports that she does not drink alcohol and does not use drugs.  PERFORMANCE STATUS: The patient's performance status is 2 - Symptomatic, <50% confined to bed  PHYSICAL EXAM: Most Recent Vital Signs: Blood pressure (!) 111/45, pulse 74, temperature 98.6 F (37 C), temperature source Oral, resp. rate 17, height '5\' 8"'$  (1.727 m), weight (!) 308 lb 10.3 oz (140 kg), SpO2 98 %. BP (!) 107/44 (BP Location: Left Arm)   Pulse 70   Temp 98.4 F (36.9 C)   Resp 17    Ht '5\' 8"'$  (1.727 m)   Wt (!) 308 lb 10.3 oz (140 kg)   SpO2 100%   BMI 46.93 kg/m  General appearance: alert and cooperative Lungs: clear to auscultation bilaterally Abdomen:  Soft, distended. Extremities:  2+ edema bilaterally.  LABORATORY DATA:  Results for orders placed or performed during the hospital encounter of 09/17/22 (from the past 48 hour(s))  Type and screen [facility]     Status: None (Preliminary result)   Collection Time: 09/17/22  1:22 PM  Result Value Ref Range   ABO/RH(D) A POS    Antibody Screen NEG    Sample Expiration 09/20/2022,2359    Unit Number [id number]    Blood Component Type RED CELLS,LR    Unit division 00    Status of Unit ISSUED    Transfusion Status OK TO TRANSFUSE    Crossmatch Result Compatible    Unit Number  [id number]    Blood Component Type RED CELLS,LR    Unit division 00    Status of Unit ISSUED    Transfusion Status OK TO TRANSFUSE    Crossmatch Result Compatible    Unit Number [id number]    Blood Component Type RED CELLS,LR    Unit division 00    Status of Unit ALLOCATED    Transfusion Status OK TO TRANSFUSE    Crossmatch Result      Compatible Performed at [facility], [address] [place], [place] [address]    Unit Number [id number]    Blood Component Type RED CELLS,LR    Unit division 00    Status of Unit ALLOCATED    Transfusion Status OK TO TRANSFUSE    Crossmatch Result Compatible   Prepare RBC (crossmatch)     Status: None   Collection Time: 09/17/22  1:22 PM  Result Value Ref Range   Order Confirmation      ORDER PROCESSED BY BLOOD BANK Performed at [facility], [address] [place], [place] [address]   Comprehensive metabolic panel     Status: Abnormal   Collection Time: 09/17/22  9:11 PM  Result Value Ref Range   Sodium 134 (L) 135 - 145 mmol/L   Potassium 4.6 3.5 - 5.1 mmol/L   Chloride 107 98 - 111 mmol/L   CO2 20 (L) 22 - 32 mmol/L   Glucose, Bld 168 (H) 70 - 99 mg/dL     Comment: Glucose reference range applies only to samples taken after fasting for at least 8 hours.   BUN 30 (H) 8 - 23 mg/dL   Creatinine, Ser 1.41 (H) 0.44 - 1.00 mg/dL   Calcium 7.9 (L) 8.9 - 10.3 mg/dL   Total Protein 4.8 (L) 6.5 - 8.1 g/dL   Albumin 2.6 (L) 3.5 - 5.0 g/dL   AST 53 (H) 15 - 41 U/L   ALT 23 0 - 44 U/L   Alkaline Phosphatase 98 38 - 126 U/L   Total Bilirubin 3.0 (H) 0.3 - 1.2 mg/dL   GFR, Estimated 42 (L) >60 mL/min    Comment: (NOTE) Calculated using the CKD-EPI Creatinine Equation (2021)    Anion gap 7 5 - 15    Comment: Performed at [facility], [address] [place], [place] [address]  Lactic acid, plasma     Status: None   Collection Time: 09/17/22  9:11 PM  Result Value Ref Range   Lactic Acid, Venous 1.7 0.5 - 1.9 mmol/L    Comment: Performed at [facility], [address], [place],  [address]  CBC with Differential     Status: Abnormal   Collection Time: 09/17/22  9:11 PM  Result Value Ref Range   WBC 16.0 (H) 4.0 - 10.5 K/uL   RBC 2.64 (L) 3.87 - 5.11 MIL/uL   Hemoglobin 6.7 (LL) 12.0 - 15.0 g/dL    Comment: This critical result has verified and been called to DODD,J by Lorette Ang on 01 31 2024 at 2141, and has been read back.    HCT 20.8 (L) 36.0 - 46.0 %   MCV 78.8 (L) 80.0 - 100.0 fL   MCH 25.4 (L) 26.0 - 34.0 pg   MCHC 32.2 30.0 - 36.0 g/dL   RDW 21.0 (H) 11.5 - 15.5 %   Platelets 123 (L) 150 - 400 K/uL   nRBC 0.2 0.0 - 0.2 %   Neutrophils Relative % 93 %   Neutro Abs 14.9 (H) 1.7 -  7.7 K/uL   Lymphocytes Relative 3 %   Lymphs Abs 0.5 (L) 0.7 - 4.0 K/uL   Monocytes Relative 3 %   Monocytes Absolute 0.5 0.1 - 1.0 K/uL   Eosinophils Relative 0 %   Eosinophils Absolute 0.0 0.0 - 0.5 K/uL   Basophils Relative 0 %   Basophils Absolute 0.0 0.0 - 0.1 K/uL   Immature Granulocytes 1 %   Abs Immature Granulocytes 0.09 (H) 0.00 - 0.07 K/uL    Comment: Performed at [facility], [address], [place], [place] [address]  Protime-INR      Status: Abnormal   Collection Time: 09/17/22  9:11 PM  Result Value Ref Range   Prothrombin Time 16.5 (H) 11.4 - 15.2 seconds   INR 1.4 (H) 0.8 - 1.2    Comment: (NOTE) INR goal varies based on device and disease states. Performed at [facility], [address] [place], [place] [address]   Culture, blood (Routine x 2)     Status: None (Preliminary result)   Collection Time: 09/17/22  9:11 PM   Specimen: Porta Cath; Blood  Result Value Ref Range   Specimen Description      PORTA CATH Performed at [facility], [address] [place], [place] [address]    Special Requests      BOTTLES DRAWN AEROBIC AND ANAEROBIC Blood Culture adequate volume Performed at [facility], [address] [place], [place] [address]    Culture  Setup Time      GRAM POSITIVE COCCI Gram Stain Report Called to,Read Back By and Verified With: Bennie Hind OF [id number] K FORSYTH AEROBIC BOTTLE ONLY Performed at [facility], [address], [place], [place] [address]    Culture      NO GROWTH < 24 HOURS Performed at [place] [address] [place], [place] [address]    Report Status PENDING   APTT     Status: None   Collection Time: 09/17/22  9:11 PM  Result Value Ref Range   aPTT 34 24 - 36 seconds    Comment: Performed at [facility], [address] [place], [place] [address]  Urinalysis, Routine w reflex microscopic -Urine, Clean Catch     Status: Abnormal   Collection Time: 09/17/22  9:20 PM  Result Value Ref Range   Color, Urine AMBER (A) YELLOW    Comment: BIOCHEMICALS MAY BE AFFECTED BY COLOR   APPearance CLEAR CLEAR   Specific Gravity, Urine 1.018 1.005 - 1.030   pH 5.0 5.0 - 8.0   Glucose, UA NEGATIVE NEGATIVE mg/dL   Hgb urine dipstick NEGATIVE NEGATIVE   Bilirubin Urine NEGATIVE NEGATIVE   Ketones, ur NEGATIVE NEGATIVE mg/dL   Protein, ur NEGATIVE NEGATIVE mg/dL   Nitrite NEGATIVE NEGATIVE   Leukocytes,Ua NEGATIVE NEGATIVE    Comment: Performed at [facility], [address], [place], [place] [address]  Culture, blood (Routine x 2)     Status: None (Preliminary result)   Collection Time: 09/17/22  9:40 PM   Specimen: Left Antecubital; Blood  Result Value Ref Range   Specimen Description      LEFT ANTECUBITAL Performed at [facility], [address], [place], [place] [address]    Special Requests      BOTTLES DRAWN AEROBIC AND ANAEROBIC Blood Culture adequate volume Performed at [facility], [address] [place], [place] [address]    Culture  Setup Time      AEROBIC BOTTLE ONLY GRAM POSITIVE COCCI CRITICAL  VALUE NOTED.  VALUE IS CONSISTENT WITH PREVIOUSLY REPORTED AND CALLED VALUE. Performed at [facility], [address], [place], [place] [address]    Culture      NO GROWTH < 24 HOURS Performed at [place] [address] [place], [place] [address]    Report Status PENDING   Lactic acid, plasma     Status: None   Collection Time: 09/17/22 10:53 PM  Result Value Ref Range   Lactic Acid, Venous 1.5 0.5 - 1.9 mmol/L    Comment: Performed at [facility], [address], [place], [place] [address]  Resp panel by RT-PCR (RSV, Flu A&B, Covid) Anterior Nasal Swab     Status: None   Collection Time: 09/17/22 10:53 PM   Specimen: Anterior Nasal Swab  Result Value Ref Range   SARS Coronavirus 2 by RT PCR NEGATIVE NEGATIVE    Comment: (NOTE) SARS-CoV-2 target nucleic acids are NOT DETECTED.  The SARS-CoV-2 RNA is generally detectable in upper respiratory specimens during the acute phase of infection. The lowest concentration of SARS-CoV-2 viral copies this assay can detect is 138 copies/mL. A negative result does not preclude SARS-Cov-2 infection and should not be used as the sole basis for treatment or other patient management decisions. A negative result may occur with  improper specimen collection/handling, submission of specimen other than nasopharyngeal swab, presence of viral mutation(s) within the areas targeted by this assay, and  inadequate number of viral copies(<138 copies/mL). A negative result must be combined with clinical observations, patient history, and epidemiological information. The expected result is Negative.  Fact Sheet for Patients:  EntrepreneurPulse.com.au  Fact Sheet for Healthcare Providers:  IncredibleEmployment.be  This test is no t yet approved or cleared by the [place] FDA and  has been authorized for detection and/or diagnosis of SARS-CoV-2 by FDA under an Emergency Use Authorization (EUA). This EUA will remain  in effect (meaning this test can be used) for the duration of the COVID-19 declaration under Section 564(b)(1) of the Act, 21 U.S.C.section 360bbb-3(b)(1), unless the authorization is terminated  or revoked sooner.       Influenza A by PCR NEGATIVE NEGATIVE   Influenza B by PCR NEGATIVE NEGATIVE    Comment: (NOTE) The Xpert Xpress SARS-CoV-2/FLU/RSV plus assay is intended as an aid in the diagnosis of influenza from Nasopharyngeal swab specimens and should not be used as a sole basis for treatment. Nasal washings and aspirates are unacceptable for Xpert Xpress SARS-CoV-2/FLU/RSV testing.  Fact Sheet for Patients: EntrepreneurPulse.com.au  Fact Sheet for Healthcare Providers: IncredibleEmployment.be  This test is not yet approved or cleared by the [place] FDA and has been authorized for detection and/or diagnosis of SARS-CoV-2 by FDA under an Emergency Use Authorization (EUA). This EUA will remain in effect (meaning this test can be used) for the duration of the COVID-19 declaration under Section 564(b)(1) of the Act, 21 U.S.C. section 360bbb-3(b)(1), unless the authorization is terminated or revoked.     Resp Syncytial Virus by PCR NEGATIVE NEGATIVE    Comment: (NOTE) Fact Sheet for Patients: EntrepreneurPulse.com.au  Fact Sheet for Healthcare  Providers: IncredibleEmployment.be  This test is not yet approved or cleared by the [place] FDA and has been authorized for detection and/or diagnosis of SARS-CoV-2 by FDA under an Emergency Use Authorization (EUA). This EUA will remain in effect (meaning this test can be used) for the duration of the COVID-19 declaration under Section 564(b)(1) of the Act, 21 U.S.C. section 360bbb-3(b)(1), unless the authorization  is terminated or revoked.  Performed at [facility], [address] [place], [place] [address]   Blood gas, venous (at [facility])     Status: Abnormal   Collection Time: 09/18/22 12:47 AM  Result Value Ref Range   pH, Ven 7.36 7.25 - 7.43   pCO2, Ven 39 (L) 44 - 60 mmHg   pO2, Ven 41 32 - 45 mmHg   Bicarbonate 21.6 20.0 - 28.0 mmol/L   Acid-base deficit 2.4 (H) 0.0 - 2.0 mmol/L   O2 Saturation 54 %   Patient temperature 40.3    Collection site BLOOD LEFT ARM    Drawn by [phone]     Comment: Performed at [facility], [address] [place], [place] [address]  Lactate dehydrogenase (pleural or peritoneal fluid)     Status: Abnormal   Collection Time: 09/18/22  5:00 AM  Result Value Ref Range   LD, Fluid 77 (H) 3 - 23 U/L    Comment: (NOTE) Results should be evaluated in conjunction with serum values    Fluid Type-FLDH PERITONEAL CAVITY     Comment: Performed at [facility], [address] [place], [place] [address]  Glucose, pleural or peritoneal fluid     Status: None   Collection Time: 09/18/22  5:00 AM  Result Value Ref Range   Glucose, Fluid 181 mg/dL    Comment: (NOTE) No normal range established for this test Results should be evaluated in conjunction with serum values    Fluid Type-FGLU PERITONEAL CAVITY     Comment: Performed at [facility], [address] [place], [place] [address]  Protein, pleural or peritoneal fluid     Status: None   Collection Time: 09/18/22  5:00 AM  Result Value Ref Range   Total protein, fluid <3.0  g/dL   Fluid Type-FTP PERITONEAL CAVITY     Comment: Performed at [facility], [address], [place], [place] [address]  Albumin, pleural or peritoneal fluid      Status: None   Collection Time: 09/18/22  5:00 AM  Result Value Ref Range   Albumin, Fluid <1.5 g/dL   Fluid Type-FALB PERITONEAL CAVITY     Comment: Performed at [facility], [address] [place], [place] [address]  Gram stain     Status: None (Preliminary result)   Collection Time: 09/18/22  5:00 AM   Specimen: Peritoneal Washings  Result Value Ref Range   Specimen Description PERITONEAL    Special Requests NONE    Gram Stain      CYTOSPIN SMEAR NO ORGANISMS SEEN WBC PRESENT,BOTH PMN AND MONONUCLEAR Performed at [facility], [address], [place], [place] [address]    Report Status PENDING   Prepare RBC (crossmatch)     Status: None   Collection Time: 09/18/22  7:43 AM  Result Value Ref Range   Order Confirmation      ORDER PROCESSED BY BLOOD BANK Performed at [facility], [address] [place], [place] [address]   Procalcitonin - Baseline     Status: None   Collection Time: 09/18/22 12:53 PM  Result Value Ref Range   Procalcitonin 6.40 ng/mL    Comment:        Interpretation: PCT > 2 ng/mL: Systemic infection (sepsis) is likely, unless other causes are known. (NOTE)       Sepsis PCT Algorithm           Lower Respiratory Tract  Infection PCT Algorithm    ----------------------------     ----------------------------         PCT < 0.25 ng/mL                PCT < 0.10 ng/mL          Strongly encourage             Strongly discourage   discontinuation of antibiotics    initiation of antibiotics    ----------------------------     -----------------------------       PCT 0.25 - 0.50 ng/mL            PCT 0.10 - 0.25 ng/mL               OR       >80% decrease in PCT            Discourage initiation of                                            antibiotics      Encourage  discontinuation           of antibiotics    ----------------------------     -----------------------------         PCT >= 0.50 ng/mL              PCT 0.26 - 0.50 ng/mL               AND       <80% decrease in PCT              Encourage initiation of                                             antibiotics       Encourage continuation           of antibiotics    ----------------------------     -----------------------------        PCT >= 0.50 ng/mL                  PCT > 0.50 ng/mL               AND         increase in PCT                  Strongly encourage                                      initiation of antibiotics    Strongly encourage escalation           of antibiotics                                     -----------------------------                                           PCT <= 0.25 ng/mL  OR                                        > 80% decrease in PCT                                      Discontinue / Do not initiate                                             antibiotics  Performed at [facility], [address], [place], [place] [address]   Protime-INR     Status: Abnormal   Collection Time: 09/18/22 12:53 PM  Result Value Ref Range   Prothrombin Time 17.4 (H) 11.4 - 15.2 seconds   INR 1.4 (H) 0.8 - 1.2    Comment: (NOTE) INR goal varies based on device and disease states. Performed at [facility], [address] [place], [place] [address]   Magnesium     Status: None   Collection Time: 09/18/22 12:53 PM  Result Value Ref Range   Magnesium 2.1 1.7 - 2.4 mg/dL    Comment: Performed at [facility], [address] [place], [place] [address]  Phosphorus     Status: None   Collection Time: 09/18/22 12:53 PM  Result Value Ref Range   Phosphorus 3.3 2.5 - 4.6 mg/dL    Comment: Performed at [facility], [address], [place], [place] [address]  Lactic acid, plasma     Status: None   Collection Time: 09/18/22 12:53  PM  Result Value Ref Range   Lactic Acid, Venous 0.6 0.5 - 1.9 mmol/L    Comment: Performed at [facility], [address], [place], [place] [address]  APTT     Status: None   Collection Time: 09/18/22 12:53 PM  Result Value Ref Range   aPTT 35 24 - 36 seconds    Comment: Performed at [facility], [address], [place], [place] [address]  Lactic acid, plasma     Status: None   Collection Time: 09/18/22  2:56 PM  Result Value Ref Range   Lactic Acid, Venous 1.1 0.5 - 1.9 mmol/L    Comment: Performed at [facility], [address], [place], [place] [address]      RADIOGRAPHY: CT CHEST ABDOMEN PELVIS W CONTRAST  Result Date: 09/18/2022 CLINICAL DATA:  Pneumonia, complication suspected. Sepsis and fever. EXAM: CT CHEST, ABDOMEN, AND PELVIS WITH CONTRAST TECHNIQUE: Multidetector CT imaging of the chest, abdomen and pelvis was performed following the standard protocol during bolus administration of intravenous contrast. RADIATION DOSE REDUCTION: This exam was performed according to the departmental dose-optimization program which includes automated exposure control, adjustment of the mA and/or kV according to patient size and/or use of iterative reconstruction technique. CONTRAST:  110m OMNIPAQUE IOHEXOL 300 MG/ML  SOLN COMPARISON:  02/05/2022. FINDINGS: CT CHEST FINDINGS Cardiovascular: The heart is enlarged and there is no pericardial effusion. The distal tip of a right chest port terminates in the right atrium. There is atherosclerotic calcification of the aorta without evidence of aneurysm. The pulmonary trunk is distended suggesting underlying pulmonary artery hypertension. Mediastinum/Nodes: No mediastinal, hilar, or axillary lymphadenopathy. The thyroid gland, trachea, and esophagus are within normal  limits. Lungs/Pleura: Atelectasis is present bilaterally. No effusion or pneumothorax. Musculoskeletal: Shoulder arthroplasty changes are noted on the right. Degenerative changes are present in the  thoracic spine. No acute or suspicious osseous abnormality. CT ABDOMEN PELVIS FINDINGS Hepatobiliary: Cirrhotic changes are noted in the liver. No focal abnormality is seen. A tips stent appear stable in position. A large amount of stones are present within the gallbladder. No biliary ductal dilatation. Pancreas: Unremarkable. No pancreatic ductal dilatation or surrounding inflammatory changes. Spleen: The spleen is enlarged measuring 16.8 cm in length. No focal abnormality. Adrenals/Urinary Tract: The adrenal glands are within normal limits. No renal calculus or hydronephrosis. The bladder is unremarkable. Stomach/Bowel: There is a small hiatal hernia. Stomach is otherwise within normal limits. Appendix is not seen. No evidence of bowel wall thickening, distention, or inflammatory changes. No free air or pneumatosis. Vascular/Lymphatic: Aortic atherosclerosis. No enlarged abdominal or pelvic lymph nodes. Reproductive: Status post hysterectomy. No adnexal masses. Other: Moderate-to-large ascites. Anasarca is noted. There is an umbilical hernia containing ascites. Musculoskeletal: Degenerative changes are present in the lumbar spine. No acute osseous abnormality. IMPRESSION: 1. No acute process in the chest, abdomen, and pelvis. 2. Morphologic changes of cirrhosis and portal hypertension. 3. Moderate-to-large ascites. 4. Anasarca. 5. Cholelithiasis. 6. Aortic atherosclerosis. 7. Remaining incidental findings as described above. Electronically Signed   By: Brett Fairy M.D.   On: 09/18/2022 02:53   CT Head Wo Contrast  Result Date: 09/18/2022 CLINICAL DATA:  Fever, altered mental status EXAM: CT HEAD WITHOUT CONTRAST TECHNIQUE: Contiguous axial images were obtained from the base of the skull through the vertex without intravenous contrast. RADIATION DOSE REDUCTION: This exam was performed according to the departmental dose-optimization program which includes automated exposure control, adjustment of the mA and/or  kV according to patient size and/or use of iterative reconstruction technique. COMPARISON:  09/21/2017 FINDINGS: Brain: No evidence of acute infarction, hemorrhage, hydrocephalus, extra-axial collection or mass lesion/mass effect. Mild subcortical white matter and periventricular small vessel ischemic changes. Vascular: Intracranial atherosclerosis. Skull: Normal. Negative for fracture or focal lesion. Sinuses/Orbits: The visualized paranasal sinuses are essentially clear. The mastoid air cells are unopacified. Other: None. IMPRESSION: No evidence of acute intracranial abnormality. Mild small vessel ischemic changes. Electronically Signed   By: Julian Hy M.D.   On: 09/18/2022 02:44   DG Chest Port 1 View  Result Date: 09/17/2022 CLINICAL DATA:  Possible sepsis EXAM: PORTABLE CHEST 1 VIEW COMPARISON:  02/08/2022 FINDINGS: Cardiac shadow is mildly enlarged but stable. Right chest wall port is now seen in satisfactory position. Lungs are hypoinflated with crowding of the vascular markings. No focal infiltrate is seen. No bony abnormality is noted. IMPRESSION: No acute abnormality noted. Electronically Signed   By: Inez Catalina M.D.   On: 09/17/2022 21:46         ASSESSMENT and PLAN:  1.  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 - Bilateral superior rectal artery coil embolization on 09/05/2022. - Received Retacrit 10,000 units and alteplase placed for lack of blood return and the port on 09/17/2022. - Presented with fever and altered mental status on 09/17/2022 to the ER.  In the ER she was found to have a temperature of 99.5. - Retacrit may cause fever with a frequency of 10-42%. - CT CAP (09/18/2022): Reviewed by me showed no acute process.  Moderate to large ascites with cirrhosis and portal hypertension. - She was also found to have a hemoglobin of  6.7.  She may receive blood transfusion as needed. - Blood cultures sent  culture from ascitic fluid is pending.  Continue antibiotics at this time.  If there is no further fevers, she may be switched to oral antibiotics.  If there is no clear source of infection, fever likely from Retacrit.  Will consider changing to a different preparation in the future. - Will check ferritin, iron panel with tomorrow labs.  Will also check for hemolysis.  All questions were answered. The patient knows to call the clinic with any problems, questions or concerns. We can certainly see the patient much sooner if necessary.   Derek Jack

## 2022-09-18 NOTE — Hospital Course (Signed)
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   On 09/17/2022 patient received IV Retacrit through her port a cath in oncology office for her beta thalassemia, also has received iron infusion secondary to chronic anemia, also had paracentesis 09/16/2022.  Proximately 45 minutes after treatment with Retacrit follow-up fever 104.6, was tachycardic with a pulse of 102, blood pressure 104/60 with RR of 17, O2 sat of 95%  Of note patient has chronic ascites, [name] liver cirrhosis requiring intermittent paracentesis, recent EGD revealed portal hypertension, patient had TIPS procedure on Dec 26, 2021 revision in July 2026 2023.  She was subsequently asked to admit for SARS criteria, to rule out sepsis.

## 2022-09-18 NOTE — Plan of Care (Signed)
  Problem: Acute Rehab PT Goals(only PT should resolve) Goal: Pt Will Go Supine/Side To Sit Outcome: Progressing Flowsheets (Taken 09/18/2022 1512) Pt will go Supine/Side to Sit:  with supervision  with min guard assist Goal: Patient Will Transfer Sit To/From Stand Outcome: Progressing Flowsheets (Taken 09/18/2022 1512) Patient will transfer sit to/from stand:  with supervision  with min guard assist Goal: Pt Will Transfer Bed To Chair/Chair To Bed Outcome: Progressing Flowsheets (Taken 09/18/2022 1512) Pt will Transfer Bed to Chair/Chair to Bed:  with supervision  min guard assist Goal: Pt Will Ambulate Outcome: Progressing Flowsheets (Taken 09/18/2022 1512) Pt will Ambulate:  25 feet  with min guard assist  with minimal assist  with rolling walker   3:12 PM, 09/18/22 Lonell Grandchild, MPT Physical Therapist with [facility] 336 [phone] office [phone] mobile phone

## 2022-09-18 NOTE — ED Provider Notes (Signed)
Physical Exam  BP (!) 140/53   Pulse (!) 104   Temp (!) 104.6 F (40.3 C) (Rectal)   Resp (!) 25   Ht '5\' 8"'$  (1.727 m)   Wt (!) 140 kg   SpO2 94%   BMI 46.93 kg/m   Physical Exam Vitals and nursing note reviewed.  Constitutional:      Appearance: She is well-developed.  HENT:     Head: Normocephalic and atraumatic.     Nose: No congestion or rhinorrhea.     Mouth/Throat:     Mouth: Mucous membranes are dry.  Eyes:     Pupils: Pupils are equal, round, and reactive to light.  Cardiovascular:     Rate and Rhythm: Regular rhythm. Tachycardia present.  Pulmonary:     Effort: No respiratory distress.     Breath sounds: No stridor.  Abdominal:     General: There is no distension.     Tenderness: There is no abdominal tenderness (fluid wave, but no ttp or peritonitis).  Musculoskeletal:        General: Normal range of motion.     Cervical back: Normal range of motion.     Comments: Moves her head freely  Skin:    General: Skin is warm and dry.     Comments: A bruise on chest and arm/hand from a dog jumping on her  Neurological:     Mental Status: She is alert.     Procedures  .Critical Care  Performed by: Merrily Pew, MD Authorized by: Merrily Pew, MD   Critical care provider statement:    Critical care time (minutes):  30   Critical care was necessary to treat or prevent imminent or life-threatening deterioration of the following conditions:  Respiratory failure and CNS failure or compromise   Critical care was time spent personally by me on the following activities:  Development of treatment plan with patient or surrogate, discussions with consultants, evaluation of patient's response to treatment, examination of patient, ordering and review of laboratory studies, ordering and review of radiographic studies, ordering and performing treatments and interventions, pulse oximetry, re-evaluation of patient's condition and review of old charts .Paracentesis  Date/Time:  09/18/2022 6:45 AM  Performed by: Merrily Pew, MD Authorized by: Merrily Pew, MD   Consent:    Consent obtained:  Verbal   Consent given by:  Patient   Risks, benefits, and alternatives were discussed: yes     Risks discussed:  Bleeding, bowel perforation, infection and pain   Alternatives discussed:  No treatment Universal protocol:    Procedure explained and questions answered to patient or proxy's satisfaction: yes     Test results available: yes     Imaging studies available: yes     Patient identity confirmed:  Verbally with patient Pre-procedure details:    Procedure purpose:  Diagnostic   Preparation: Patient was prepped and draped in usual sterile fashion   Anesthesia:    Anesthesia method:  Local infiltration   Local anesthetic:  Lidocaine 2% WITH epi Procedure details:    Needle gauge:  18   Ultrasound guidance: yes     Puncture site:  R lower quadrant   Fluid removed amount:  60cc   Fluid appearance:  Cloudy and yellow   Dressing:  Adhesive bandage (dermabond) Post-procedure details:    Procedure completion:  Tolerated   ED Course / MDM    Medical Decision Making Amount and/or Complexity of Data Reviewed Labs: ordered. Radiology: ordered. ECG/medicine tests: ordered.  Risk  Prescription drug management. Decision regarding hospitalization.   Assumed care from Dr. Rogene Houston around [phone] patient with SIRS vital signs and altered mental status started approximately an hour after getting Retacrit infusion.  Do think it is probably related to that but she has multiple other medical problems so the concern that she could also be septic.  She has a chronic headache is not any worse.  She is altered/confused and temperature 104.6, tachycardia, tachypnea.  Pending a head CT and CT abdomen pelvis.  After Tylenol she defervesced and her heart rate improved as well.  CT scans without any obvious causes for infection.  Rest of her workup is relatively reassuring besides  being anemic as she is planning to get a blood transfusion already.  On my reevaluation the patient is awake and alert answering questions and is much more oriented than described to me earlier.  Husband states that about 30 to 45 minutes after the Tylenol there they seem to start to improve.  I suspect that her altered mental status and confusion prior related to the fever more than meningitis or other CNS infection.  Patient describes that she has little bit of neck discomfort but is moving her head freely no headache at this time.  Pupils equal round reactive to light.  CT scan normal.  Low suspicion for meningitis.  She had a paracentesis done about 24 hours ago and it was clear.  She has no abdominal tenderness.  Low suspicion for SBP, however it does not appear fluid from yesterday was sent for labs or culture.  Patient seems to be improving and once again it seems like it is likely a medication reaction however she does have an indwelling port and multiple medical problems and is still slightly tachypneic so will discuss with hospitalist for observation.  Discussed with hospitalist. Agrees not likely meningitis with rapid improvement after defervescence however does think SBP is still on the differential so a diagnostic paracentesis was done, refer to procedure note above. Will admit.       Jilleen Essner, Corene Cornea, MD 09/18/22 [phone]

## 2022-09-18 NOTE — Assessment & Plan Note (Addendum)
-   Platelets 116, 123, 131 -  no signs of bleeding, monitoring closely - For platelets less than 50 will transfuse platelets -Monitoring

## 2022-09-18 NOTE — Assessment & Plan Note (Addendum)
-  Meeting SIRS criteria versus adverse reaction to IV medication of Retacrit,   Ruling out sepsis-initiate sepsis protocol -IV fluid resuscitation and broad-spectrum antibiotic, cultures

## 2022-09-18 NOTE — Assessment & Plan Note (Signed)
-  Continue supplemental oxygen, CPAP nightly

## 2022-09-18 NOTE — ED Notes (Signed)
Report given to 300 RN

## 2022-09-18 NOTE — Assessment & Plan Note (Addendum)
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

## 2022-09-18 NOTE — Progress Notes (Signed)
OT Cancellation Note  Patient Details Name: Rachel Huber MRN: [id number] DOB: 04/13/1956   Cancelled Treatment:    Reason Eval/Treat Not Completed: Medical issues which prohibited therapy. Hold due to low hemoglobin. Will attempt to see pt later when within appropriate ranges for therapy.   Myana Schlup OT, MOT   Larey Seat 09/18/2022, 8:21 AM

## 2022-09-18 NOTE — Assessment & Plan Note (Addendum)
-  Likely due to thalassemia minor -Status post ?1/2U PRBC transfusion 09/18/2022 -09/20/2022 agreed to 2U PRBC transfusion  -Hemoglobin 7.1, 6.7  >>> 7.2 >> 6.9  --History of frequent blood transfusion,  -Baseline hemoglobin: 5.8-8.7

## 2022-09-18 NOTE — H&P (Signed)
History and Physical   Patient: Rachel Huber                            PCP: [name], MD                    DOB: 1956/06/12            DOA: 09/17/2022 IRW:[id number]             DOS: 09/18/2022, 8:10 AM  [name], MD  Patient coming from:   HOME  I have personally reviewed patient's medical records, in electronic medical records, including:  [facility], and care everywhere.    Chief Complaint:   Chief Complaint  Patient presents with   Altered Mental Status   Fever    History of present illness:    Rachel Huber is a 67 year old female with significant history of beta thalassemia minor (status post first treatment with IV Retacrit,on 1/31), aslo H/o cirrhosis NASH,with paracentesis 1/30, CKD 3B, HLD, GERD, internal hemorrhoids, OSA-CPAP, anxiety/depression, chronic anemia -with multiple PRBC transfusion, chronic hemorrhoid with bleeds, insomnia, ....   On 09/17/2022 patient received IV Retacrit through her port a cath in oncology office for her beta thalassemia, also has received iron infusion secondary to chronic anemia, also had paracentesis 09/16/2022.  Proximately 45 minutes after treatment with Retacrit follow-up fever 104.6, was tachycardic with a pulse of 102, blood pressure 104/60 with RR of 17, O2 sat of 95%  Of note patient has chronic ascites, Rachel Huber liver cirrhosis requiring intermittent paracentesis, recent EGD revealed portal hypertension, patient had TIPS procedure on Dec 26, 2021 revision in July 2026 2023.  She was subsequently asked to admit for SARS criteria, to rule out sepsis.     Patient Denies having: Fever, Chills, Cough, SOB, Chest Pain, Abd pain, N/V/D, headache, dizziness, lightheadedness,  Dysuria, Joint pain, rash, open wounds  ED Course:   Blood pressure (!) 109/44, pulse 86, temperature 99.5 F (37.5 C), temperature source Oral, resp. rate 10, height '5\' 8"'$  (1.727 m), weight (!) 140 kg, SpO2 93 %. Abnormal  labs;   Review of Systems: As per HPI, otherwise 10 point review of systems were negative.   ----------------------------------------------------------------------------------------------------------------------  Allergies  Allergen Reactions   Gramineae Pollens Other (See Comments)    Sneezing, Running nose   Latex Rash    Contact rash    Home MEDs:  Prior to Admission medications   Medication Sig Start Date End Date Taking? Authorizing Provider  acetaminophen (TYLENOL) 650 MG CR tablet Take 1,300 mg by mouth daily as needed for pain.   Yes [provider]  albuterol (VENTOLIN HFA) 108 (90 Base) MCG/ACT inhaler Inhale 2 puffs into the lungs every 6 (six) hours as needed for wheezing or shortness of breath. 02/10/22  Yes Wieting, Richard, MD  atorvastatin (LIPITOR) 10 MG tablet Take 1 tablet (10 mg total) by mouth daily. 06/20/22  Yes Fritzi Mandes, MD  diphenhydrAMINE (BENADRYL) 25 MG tablet Take 25 mg by mouth 2 (two) times daily as needed for itching.   Yes [provider]  ergocalciferol (VITAMIN D2) 1.25 MG (50000 UT) capsule Take 1 capsule (50,000 Units total) by mouth every 14 (fourteen) days. 09/01/22  Yes Pennington, Rebekah M, PA-C  furosemide (LASIX) 40 MG tablet Take 40 mg by mouth daily. 06/24/22 06/24/23 Yes [provider]  gabapentin (NEURONTIN) 400 MG capsule Take 800 mg by mouth 2 (  two) times daily. 01/07/18  Yes [provider]  hydrocortisone (ANUCORT-HC) 25 MG suppository Place 1 suppository (25 mg total) rectally 2 (two) times daily as needed for anal itching or hemorrhoids. 06/20/22  Yes Fritzi Mandes, MD  lidocaine-prilocaine (EMLA) cream Apply 1 Application topically See admin instructions for 1 dose. Apply to port site 1 hour prior to procedure 06/23/22  Yes Clapp, Alysia Penna, NP  melatonin 5 MG TABS Take 10 mg by mouth at bedtime as needed (sleep).   Yes [provider]  Multiple Vitamins-Minerals (MULTIVITAMIN WITH MINERALS) tablet  Take 1 tablet by mouth daily.   Yes [provider]  polyethylene glycol (MIRALAX / GLYCOLAX) 17 g packet Take 17 g by mouth 2 (two) times daily. 06/20/22  Yes Fritzi Mandes, MD  sertraline (ZOLOFT) 50 MG tablet Take 50 mg by mouth daily. 12/12/17  Yes [provider]  vitamin B-12 (CYANOCOBALAMIN) 1000 MCG tablet Take 1,000 mcg by mouth daily.   Yes [provider]  hydrocortisone (ANUSOL-HC) 2.5 % rectal cream Place rectally 4 (four) times daily. Patient not taking: Reported on 09/18/2022 03/07/22   Loletha Grayer, MD  lactulose, encephalopathy, (CHRONULAC) 10 GM/15ML SOLN Take 30 mLs (20 g total) by mouth daily. Patient not taking: Reported on 09/18/2022 01/30/22   Roxan Hockey, MD  witch hazel-glycerin (TUCKS) pad Apply topically as needed for hemorrhoids. Patient not taking: Reported on 09/18/2022 03/07/22   Loletha Grayer, MD    PRN MEDs: acetaminophen **OR** acetaminophen, albuterol, bisacodyl, diphenhydrAMINE, hydrALAZINE, HYDROmorphone (DILAUDID) injection, ipratropium, levalbuterol, ondansetron **OR** ondansetron (ZOFRAN) IV, oxyCODONE, senna-docusate, sodium phosphate, traMADol, traZODone  Past Medical History:  Diagnosis Date   Anemia in chronic kidney disease (CKD) 02/07/2022   Anxiety    Arthritis    Cirrhosis of liver (HCC)    Diabetes mellitus without complication ([place])    Dyspnea    DOE   GERD (gastroesophageal reflux disease)    Grade IV internal hemorrhoids    Contingency plan for any future admissions for severe anemia in the setting of persistent GI bleed:  nuclear medicine tagged RBC scan to assess for location of GI bleeding - if from rectum, would then proceed to angiogram with possible repeat embolization.  Please notify IR in this case.   Hepatitis    PAST   Hypertension    Neuropathy    Neuropathy, diabetic ([place])    Pneumonia    Sinus complaint    Sleep apnea    CPAP   Thalassemia minor 1992    Past Surgical History:  Procedure  Laterality Date   ABDOMINAL HYSTERECTOMY     BREAST BIOPSY Right 02/15/2018   [place] bx 6-6:30 ribbon shape, ONE CORE FRAGMENT WITH FIBROSIS. ONE CORE FRAGMENT WITH PORTION OF A DILATED   BREAST BIOPSY Right 02/15/2018   [place] bx 9:00 heart shape, USUAL DUCTAL HYPERPLASIA   BREAST LUMPECTOMY Right 03/09/2018   Procedure: BREAST LUMPECTOMY x 2;  Surgeon: Benjamine Sprague, DO;  Location: [facility] ORS;  Service: General;  Laterality: Right;   CATARACT EXTRACTION W/PHACO Left 06/11/2016   Procedure: CATARACT EXTRACTION PHACO AND INTRAOCULAR LENS PLACEMENT (IOC);  Surgeon: Estill Cotta, MD;  Location: [facility] ORS;  Service: Ophthalmology;  Laterality: Left;  Lot # [id number] H US:01:38.6 AP%:26.4 CDE:44.15   CATARACT EXTRACTION W/PHACO Right 06/15/2018   Procedure: CATARACT EXTRACTION PHACO AND INTRAOCULAR LENS PLACEMENT (IOC);  Surgeon: Birder Robson, MD;  Location: [facility];  Service: Ophthalmology;  Laterality: Right;  [place] 00:38.2 CDE 4.23 Fluid Pack Lot # [id number]  COLONOSCOPIES     COLONOSCOPY WITH PROPOFOL N/A 10/10/2020   Procedure: COLONOSCOPY WITH PROPOFOL;  Surgeon: Lesly Rubenstein, MD;  Location: [facility] ENDOSCOPY;  Service: Endoscopy;  Laterality: N/A;   COLONOSCOPY WITH PROPOFOL N/A 11/20/2020   Procedure: COLONOSCOPY WITH PROPOFOL;  Surgeon: Lesly Rubenstein, MD;  Location: [facility] ENDOSCOPY;  Service: Endoscopy;  Laterality: N/A;  DM STAT CBC, BMP COVID POSITIVE 09/02/2020   COLONOSCOPY WITH PROPOFOL N/A 06/19/2022   Procedure: COLONOSCOPY WITH PROPOFOL;  Surgeon: Lesly Rubenstein, MD;  Location: [facility] ENDOSCOPY;  Service: Endoscopy;  Laterality: N/A;   CTR     ESOPHAGOGASTRODUODENOSCOPY (EGD) WITH PROPOFOL N/A 10/10/2020   Procedure: ESOPHAGOGASTRODUODENOSCOPY (EGD) WITH PROPOFOL;  Surgeon: Lesly Rubenstein, MD;  Location: [facility] ENDOSCOPY;  Service: Endoscopy;  Laterality: N/A;  COVID POSITIVE 10/08/2020   FLEXIBLE SIGMOIDOSCOPY N/A 02/06/2022   Procedure: FLEXIBLE SIGMOIDOSCOPY;  Surgeon:  Lesly Rubenstein, MD;  Location: [facility] ENDOSCOPY;  Service: Endoscopy;  Laterality: N/A;  Patient requests anesthesia   IR ANGIOGRAM SELECTIVE EACH ADDITIONAL VESSEL  02/07/2022   IR ANGIOGRAM SELECTIVE EACH ADDITIONAL VESSEL  02/07/2022   IR ANGIOGRAM SELECTIVE EACH ADDITIONAL VESSEL  09/05/2022   IR ANGIOGRAM VISCERAL SELECTIVE  02/07/2022   IR ANGIOGRAM VISCERAL SELECTIVE  09/05/2022   IR EMBO ART  VEN HEMORR LYMPH EXTRAV  INC GUIDE ROADMAPPING  09/05/2022   IR EMBO ARTERIAL NOT HEMORR HEMANG INC GUIDE ROADMAPPING  02/07/2022   IR IMAGING GUIDED PORT INSERTION  06/13/2022   IR PARACENTESIS  12/17/2021   IR PARACENTESIS  03/25/2022   IR RADIOLOGIST EVAL & MGMT  03/18/2022   IR RADIOLOGIST EVAL & MGMT  05/07/2022   IR RADIOLOGIST EVAL & MGMT  05/16/2022   IR US GUIDE VASC ACCESS RIGHT  02/07/2022   IR US GUIDE VASC ACCESS RIGHT  09/05/2022   JOINT REPLACEMENT     KNEE SURGERY Right 09/24/2017   plates and pins   TOTAL SHOULDER ARTHROPLASTY Right 09/27/2015     reports that she has never smoked. She has never used smokeless tobacco. She reports that she does not drink alcohol and does not use drugs.   Family History  Problem Relation Age of Onset   Breast cancer Mother 32   Lymphoma Mother    Diabetes Father    Kidney cancer Father    Heart disease Father    Diabetes Sister    Breast cancer Sister 53    Physical Exam:   Vitals:   09/18/22 0600 09/18/22 0625 09/18/22 0640 09/18/22 0641  BP: (!) 112/45 (!) 110/42 (!) 109/44 (!) 109/44  Pulse: 88 86 86   Resp: '19 20 10   '$ Temp:  99.6 F (37.6 C) 99.5 F (37.5 C) 99.5 F (37.5 C)  TempSrc:  Oral Oral Oral  SpO2: 92%  92% 93%  Weight:      Height:       Constitutional: NAD, calm, comfortable Eyes: PERRL, lids and conjunctivae normal ENMT: Mucous membranes are moist. Posterior pharynx clear of any exudate or lesions.Normal dentition.  Neck: normal, supple, no masses, no thyromegaly Respiratory: clear to auscultation bilaterally, no  wheezing, no crackles. Normal respiratory effort. No accessory muscle use.  Cardiovascular: Regular rate and rhythm, no murmurs / rubs / gallops. No extremity edema. 2+ pedal pulses. No carotid bruits.  Abdomen: no tenderness, no masses palpated. No hepatosplenomegaly. Bowel sounds positive.  Musculoskeletal: no clubbing / cyanosis. No joint deformity upper and lower extremities. Good ROM, no contractures. Normal muscle tone.  Neurologic:  CN II-XII grossly intact. Sensation intact, DTR normal. Strength 5/5 in all 4.  Psychiatric: Normal judgment and insight. Alert and oriented x 3. Normal mood.  Skin: no rashes, lesions, ulcers. No induration Decubitus/ulcers:  Wounds: per nursing documentation         Labs on admission:    I have personally reviewed following labs and imaging studies  CBC: Recent Labs  Lab 09/17/22 1322 09/17/22 2111  WBC 4.7 16.0*  NEUTROABS 3.5 14.9*  HGB 7.1* 6.7*  HCT 22.6* 20.8*  MCV 80.4 78.8*  PLT 116* 034*   Basic Metabolic Panel: Recent Labs  Lab 09/17/22 2111  NA 134*  K 4.6  CL 107  CO2 20*  GLUCOSE 168*  BUN 30*  CREATININE 1.41*  CALCIUM 7.9*   GFR: Estimated Creatinine Clearance: 60 mL/min (A) (by C-G formula based on SCr of 1.41 mg/dL (H)). Liver Function Tests: Recent Labs  Lab 09/17/22 2111  AST 53*  ALT 23  ALKPHOS 98  BILITOT 3.0*  PROT 4.8*  ALBUMIN 2.6*   No results for input(s): "LIPASE", "AMYLASE" in the last 168 hours. No results for input(s): "AMMONIA" in the last 168 hours. Coagulation Profile: Recent Labs  Lab 09/17/22 2111  INR 1.4*   Cardiac Enzymes: No results for input(s): "CKTOTAL", "CKMB", "CKMBINDEX", "TROPONINI" in the last 168 hours. BNP (last 3 results) No results for input(s): "PROBNP" in the last 8760 hours. HbA1C: No results for input(s): "HGBA1C" in the last 72 hours. CBG: No results for input(s): "GLUCAP" in the last 168 hours. Lipid Profile: No results for input(s): "CHOL", "HDL",  "LDLCALC", "TRIG", "CHOLHDL", "LDLDIRECT" in the last 72 hours. Thyroid Function Tests: No results for input(s): "TSH", "T4TOTAL", "FREET4", "T3FREE", "THYROIDAB" in the last 72 hours. Anemia Panel: No results for input(s): "VITAMINB12", "FOLATE", "FERRITIN", "TIBC", "IRON", "RETICCTPCT" in the last 72 hours. Urine analysis:    Component Value Date/Time   COLORURINE AMBER (A) 09/17/2022 2120   APPEARANCEUR CLEAR 09/17/2022 2120   LABSPEC 1.018 09/17/2022 2120   PHURINE 5.0 09/17/2022 2120   GLUCOSEU NEGATIVE 09/17/2022 2120   HGBUR NEGATIVE 09/17/2022 2120   [place] NEGATIVE 09/17/2022 2120   [place] NEGATIVE 09/17/2022 2120   PROTEINUR NEGATIVE 09/17/2022 2120   NITRITE NEGATIVE 09/17/2022 2120   LEUKOCYTESUR NEGATIVE 09/17/2022 2120    Last A1C:  Lab Results  Component Value Date   HGBA1C 5.5 07/01/2022     Radiologic Exams on Admission:   CT CHEST ABDOMEN PELVIS W CONTRAST  Result Date: 09/18/2022 CLINICAL DATA:  Pneumonia, complication suspected. Sepsis and fever. EXAM: CT CHEST, ABDOMEN, AND PELVIS WITH CONTRAST TECHNIQUE: Multidetector CT imaging of the chest, abdomen and pelvis was performed following the standard protocol during bolus administration of intravenous contrast. RADIATION DOSE REDUCTION: This exam was performed according to the departmental dose-optimization program which includes automated exposure control, adjustment of the mA and/or kV according to patient size and/or use of iterative reconstruction technique. CONTRAST:  133m OMNIPAQUE IOHEXOL 300 MG/ML  SOLN COMPARISON:  02/05/2022. FINDINGS: CT CHEST FINDINGS Cardiovascular: The heart is enlarged and there is no pericardial effusion. The distal tip of a right chest port terminates in the right atrium. There is atherosclerotic calcification of the aorta without evidence of aneurysm. The pulmonary trunk is distended suggesting underlying pulmonary artery hypertension. Mediastinum/Nodes: No mediastinal,  hilar, or axillary lymphadenopathy. The thyroid gland, trachea, and esophagus are within normal limits. Lungs/Pleura: Atelectasis is present bilaterally. No effusion or pneumothorax. Musculoskeletal: Shoulder arthroplasty changes are noted on the right. Degenerative  changes are present in the thoracic spine. No acute or suspicious osseous abnormality. CT ABDOMEN PELVIS FINDINGS Hepatobiliary: Cirrhotic changes are noted in the liver. No focal abnormality is seen. A tips stent appear stable in position. A large amount of stones are present within the gallbladder. No biliary ductal dilatation. Pancreas: Unremarkable. No pancreatic ductal dilatation or surrounding inflammatory changes. Spleen: The spleen is enlarged measuring 16.8 cm in length. No focal abnormality. Adrenals/Urinary Tract: The adrenal glands are within normal limits. No renal calculus or hydronephrosis. The bladder is unremarkable. Stomach/Bowel: There is a small hiatal hernia. Stomach is otherwise within normal limits. Appendix is not seen. No evidence of bowel wall thickening, distention, or inflammatory changes. No free air or pneumatosis. Vascular/Lymphatic: Aortic atherosclerosis. No enlarged abdominal or pelvic lymph nodes. Reproductive: Status post hysterectomy. No adnexal masses. Other: Moderate-to-large ascites. Anasarca is noted. There is an umbilical hernia containing ascites. Musculoskeletal: Degenerative changes are present in the lumbar spine. No acute osseous abnormality. IMPRESSION: 1. No acute process in the chest, abdomen, and pelvis. 2. Morphologic changes of cirrhosis and portal hypertension. 3. Moderate-to-large ascites. 4. Anasarca. 5. Cholelithiasis. 6. Aortic atherosclerosis. 7. Remaining incidental findings as described above. Electronically Signed   By: Brett Fairy M.D.   On: 09/18/2022 02:53   CT Head Wo Contrast  Result Date: 09/18/2022 CLINICAL DATA:  Fever, altered mental status EXAM: CT HEAD WITHOUT CONTRAST  TECHNIQUE: Contiguous axial images were obtained from the base of the skull through the vertex without intravenous contrast. RADIATION DOSE REDUCTION: This exam was performed according to the departmental dose-optimization program which includes automated exposure control, adjustment of the mA and/or kV according to patient size and/or use of iterative reconstruction technique. COMPARISON:  09/21/2017 FINDINGS: Brain: No evidence of acute infarction, hemorrhage, hydrocephalus, extra-axial collection or mass lesion/mass effect. Mild subcortical white matter and periventricular small vessel ischemic changes. Vascular: Intracranial atherosclerosis. Skull: Normal. Negative for fracture or focal lesion. Sinuses/Orbits: The visualized paranasal sinuses are essentially clear. The mastoid air cells are unopacified. Other: None. IMPRESSION: No evidence of acute intracranial abnormality. Mild small vessel ischemic changes. Electronically Signed   By: Julian Hy M.D.   On: 09/18/2022 02:44   DG Chest Port 1 View  Result Date: 09/17/2022 CLINICAL DATA:  Possible sepsis EXAM: PORTABLE CHEST 1 VIEW COMPARISON:  02/08/2022 FINDINGS: Cardiac shadow is mildly enlarged but stable. Right chest wall port is now seen in satisfactory position. Lungs are hypoinflated with crowding of the vascular markings. No focal infiltrate is seen. No bony abnormality is noted. IMPRESSION: No acute abnormality noted. Electronically Signed   By: Inez Catalina M.D.   On: 09/17/2022 21:46   US Paracentesis  Result Date: 09/16/2022 INDICATION: Refractory ascites in patient with history of NASH cirrhosis status post TIPS procedure May 2023 and TIPS revision July [facility]23 at [facility]. She recently underwent a second TIPS revision 08/20/2022. Request received for therapeutic paracentesis with 8 L max. EXAM: ULTRASOUND GUIDED THERAPEUTIC RIGHT UPPER QUADRANT PARACENTESIS MEDICATIONS: 10 mL 1 % lidocaine COMPLICATIONS: None immediate. PROCEDURE: Informed  written consent was obtained from the patient after a discussion of the risks, benefits and alternatives to treatment. A timeout was performed prior to the initiation of the procedure. Initial ultrasound scanning demonstrates a moderate amount of ascites within the right upper abdominal quadrant. The right upper abdomen was prepped and draped in the usual sterile fashion. 1% lidocaine was used for local anesthesia. Following this, a 19 gauge,10-cm, Yueh catheter was introduced. An ultrasound image was saved for  documentation purposes. The paracentesis was performed. The catheter was removed and a dressing was applied. The patient tolerated the procedure well without immediate post procedural complication. Patient received post-procedure intravenous albumin; see nursing notes for details. FINDINGS: A total of approximately 8 L of clear, yellow fluid was removed. IMPRESSION: Successful ultrasound-guided paracentesis yielding 8 liters of peritoneal fluid. Read by: Narda Rutherford, AGNP-BC Electronically Signed   By: Sandi Mariscal M.D.   On: 09/16/2022 12:39    EKG:   Independently reviewed.  Orders placed or performed during the hospital encounter of 09/17/22   ED EKG 12-Lead   ED EKG 12-Lead   EKG 12-Lead   ---------------------------------------------------------------------------------------------------------------------------------------    Assessment / Plan:   Principal Problem:   SIRS (systemic inflammatory response syndrome) (HCC) Active Problems:   Fever   Acute on chronic blood loss anemia   Acute kidney injury superimposed on CKD ([place])   Depression with anxiety   HLD (hyperlipidemia)   Thrombocytopenia (HCC)   Chronic kidney disease, stage 3b (HCC)   Thalassemia minor   Type 2 diabetes mellitus with hyperlipidemia (HCC)   OSA on CPAP   Obesity (BMI 30-39.9)   Assessment and Plan: * SIRS (systemic inflammatory response syndrome) (HCC) -Meeting SIRS criteria versus adverse reaction  to IV medication of Retacrit,  - Ruling out sepsis-initiate sepsis protocol -IV fluid resuscitation and broad-spectrum antibiotic, cultures  Acute on chronic blood loss anemia -Likely due to thalassemia minor -Hemoglobin 7.1, 6.7 this morning --History of frequent blood transfusion,  -Baseline hemoglobin: 5.8-8.7  -Transfusing 2U PRBC (pros and cons of blood transfusion discussed-patient agreeable)  Fever -Tmax 104.6 this morning 99.5 -Met SIRS criteria,--- this protocol -Ruling out sepsis reaction to IV medication Retacrit,   Acute kidney injury superimposed on CKD ([place]) -Monitoring -Avoiding nephrotoxins  Lab Results  Component Value Date   CREATININE 1.41 (H) 09/17/2022   CREATININE 1.43 (H) 09/05/2022   CREATININE 1.70 (H) 08/13/2022     Depression with anxiety -Stable continue home medication of sertraline, Neurontin -As needed Xanax  Thrombocytopenia (HCC) - Platelets 116, 123, no signs of bleeding, monitoring closely For any sites of bleeding or platelets less than 50 will transfuse platelets -Monitoring  HLD (hyperlipidemia) -Sinew statins  Chronic kidney disease, stage 3b ([place]) Monitoring BUN/creatinine closely -Nephrotoxins -Starting IV fluids,  Thalassemia minor -Close follow-up with oncologist -Status post first treatment with IV Retacrit, on 09/17/2022 -Post infusion reaction with fever -Discussed the case with Dr. 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 -Advised close follow-up with PCP regarding weight loss programs -Clear diet and exercise recommended  OSA on CPAP -Continue supplemental oxygen, CPAP nightly  Type 2 diabetes mellitus with hyperlipidemia (HCC) -This morning glucose 168, -Patient is not on any  diabetic medication -Will monitor blood sugars closely -Last hemoglobin A1C 5.5 months ago  -Continue statins    Consults called: Oncology Dr. Delton Coombes -------------------------------------------------------------------------------------------------------------------------------------------- DVT prophylaxis:  heparin injection 5,000 Units Start: 09/18/22 1000 TED hose Start: 09/18/22 0735 SCDs Start: 09/18/22 0403   Code Status:   Code Status: Full Code    Family Communication:  none at bedside  (The above findings and plan of care has been discussed with patient in detail, the patient expressed understanding and agreement of above plan)  --------------------------------------------------------------------------------------------------------------------------------------------------  Disposition Plan:  Anticipated 1-2 days Status is: Observation The patient remains  OBS appropriate and will d/c before 2 midnights.   ----------------------------------------------------------------------------------------------------------------------------------------------------  Time spent: > than  76  Min.  Was spent seeing and evaluating the patient, reviewing all medical records, drawn plan of care.  SIGNED: Deatra James, MD, FHM. FAAFP. [place] [facility], Pager  (Please use [email or web address] to page/ or secure chat through epic) If 7PM-7AM, please contact night-coverage [email or web address],  09/18/2022, 8:10 AM

## 2022-09-18 NOTE — TOC Initial Note (Signed)
Transition of Care [facility]) - Initial/Assessment Note    Patient Details  Name: Rachel Huber MRN: [id number] Date of Birth: 05-03-1956  Transition of Care [facility]) CM/SW Contact:    Ihor Gully, LCSW Phone Number: 09/18/2022, 2:17 PM  Clinical Narrative:                 Patient from home with spouse. Admitted for SIRS. Considered high risk for readmission. Uses a walker. Does not drive, husband takes to appointments and assists with ADLs. Previously active with OPPT, discontinued about 1-2 months ago due to fluid overload.   Expected Discharge Plan: Home/Self Care Barriers to Discharge: Continued Medical Work up   Patient Goals and CMS Choice Patient states their goals for this hospitalization and ongoing recovery are:: return home per spouse          Expected Discharge Plan and Services       Living arrangements for the past 2 months: Single Family Home                                      Prior Living Arrangements/Services Living arrangements for the past 2 months: Single Family Home Lives with:: Spouse Patient language and need for interpreter reviewed:: Yes Do you feel safe going back to the place where you live?: Yes      Need for Family Participation in Patient Care: Yes (Comment) Care giver support system in place?: Yes (comment)   Criminal Activity/Legal Involvement Pertinent to Current Situation/Hospitalization: No - Comment as needed  Activities of Daily Living Home Assistive Devices/Equipment: Walker (specify type) ADL Screening (condition at time of admission) Patient's cognitive ability adequate to safely complete daily activities?: Yes Is the patient deaf or have difficulty hearing?: No Does the patient have difficulty seeing, even when wearing glasses/contacts?: No Does the patient have difficulty concentrating, remembering, or making decisions?: No Patient able to express need for assistance with ADLs?: Yes Does the patient have difficulty  dressing or bathing?: No Independently performs ADLs?: Yes (appropriate for developmental age) Does the patient have difficulty walking or climbing stairs?: Yes Weakness of Legs: None Weakness of Arms/Hands: None  Permission Sought/Granted Permission sought to share information with : Family Supports    Share Information with NAME: Spous;e, Mr. [place]over           Emotional Assessment     Affect (typically observed): Appropriate Orientation: : Oriented to Self, Oriented to Place Alcohol / Substance Use: Not Applicable Psych Involvement: No (comment)  Admission diagnosis:  SIRS (systemic inflammatory response syndrome) (HCC) [R65.10] Sepsis, due to unspecified organism, unspecified whether acute organ dysfunction present [facility]) [A41.9] Patient Active Problem List   Diagnosis Date Noted   SIRS (systemic inflammatory response syndrome) ([place]) 09/18/2022   Rectal bleeding 06/17/2022   Obesity (BMI 30-39.9) 06/17/2022   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 mellitus ([place]) 06/17/2022   Grade IV internal hemorrhoids 05/16/2022   Acute on chronic blood loss anemia 03/05/2022   Thrombocytopenia ([place]) 03/05/2022   Fever 02/08/2022   Anemia in chronic kidney disease (CKD) 02/07/2022   GI bleeding 02/04/2022   Chronic kidney disease, stage 3b ([place]) 02/04/2022   Depression with anxiety 02/04/2022   Liver cirrhosis secondary to NASH ([place])    Insomnia 01/27/2022   Iron deficiency anemia due to chronic blood loss 02/13/2021   History of uterine cancer 02/13/2021   Decompensated hepatic cirrhosis ([place]) 10/25/2020  OSA on CPAP 10/25/2020   Glossitis 10/25/2020   Hyponatremia 10/08/2020   Acute respiratory failure with hypoxia ([place]) 09/10/2020   Pneumonia due to COVID-19 virus 09/08/2020   Hip fracture, right ([place]) 09/04/2020   Closed right hip fracture ([place]) 09/02/2020   Type 2 diabetes mellitus with hyperlipidemia ([place]) 09/02/2020   Thalassemia minor    HLD (hyperlipidemia)    Acute  kidney injury superimposed on CKD ([place])    Depression    Nondisplaced fracture of greater trochanter of right femur, initial encounter for closed fracture ([place])    PCP:  [name], MD Pharmacy:   [facility] [address], [place] - [place] [address] [place] [address] Phone: [phone] Fax: [phone]     Social Determinants of Health (SDOH) Social History: SDOH Screenings   Food Insecurity: No Food Insecurity (09/18/2022)  Housing: Low Risk  (09/18/2022)  Transportation Needs: No Transportation Needs (09/18/2022)  Utilities: Not At Risk (09/18/2022)  Alcohol Screen: Low Risk  (04/04/2022)  Depression (PHQ2-9): Low Risk  (04/04/2022)  Financial Resource Strain: Low Risk  (04/04/2022)  Physical Activity: Inactive (04/04/2022)  Social Connections: Moderately Isolated (04/04/2022)  Stress: No Stress Concern Present (04/04/2022)  Tobacco Use: Low Risk  (09/18/2022)   SDOH Interventions:     Readmission Risk Interventions    03/26/2022    2:40 PM  Readmission Risk Prevention Plan  Transportation Screening Complete  Medication Review ([place]) Complete  PCP or Specialist appointment within 3-5 days of discharge Complete  HRI or [place] Complete  SW Recovery Care/Counseling Consult Complete  [place] Not Applicable

## 2022-09-18 NOTE — Assessment & Plan Note (Addendum)
Stable vitals today Blood pressure (!) 118/53, HR 74, temp. 98.5 F (36.9 C), RR 20, SpO2 98 % on RA  -POA: Tmax 104.6,  -Met SIRS criteria,--- this protocol -Ruling out sepsis reaction to IV medication Retacrit,

## 2022-09-18 NOTE — Assessment & Plan Note (Addendum)
-  Monitoring -Avoiding nephrotoxins  Lab Results  Component Value Date   CREATININE 1.70 (H) 09/20/2022   CREATININE 1.58 (H) 09/19/2022   CREATININE 1.41 (H) 09/17/2022   -Worsening kidney function, continue with gentle IV fluid hydration

## 2022-09-18 NOTE — Assessment & Plan Note (Signed)
-  Stable continue home medication of sertraline, Neurontin -As needed Xanax

## 2022-09-18 NOTE — Assessment & Plan Note (Signed)
Body mass index is 46.93 kg/m. -Advised close follow-up with PCP regarding weight loss programs -Clear diet and exercise recommended

## 2022-09-18 NOTE — Assessment & Plan Note (Signed)
-  This morning glucose 168, -Patient is not on any diabetic medication -Will monitor blood sugars closely -Last hemoglobin A1C 5.5 months ago  -Continue statins

## 2022-09-18 NOTE — Assessment & Plan Note (Signed)
-  Sinew statins

## 2022-09-19 DIAGNOSIS — R6510 Systemic inflammatory response syndrome (SIRS) of non-infectious origin without acute organ dysfunction: Secondary | ICD-10-CM | POA: Diagnosis not present

## 2022-09-19 LAB — CBC WITH DIFFERENTIAL/PLATELET
Abs Immature Granulocytes: 0.04 10*3/uL (ref 0.00–0.07)
Basophils Absolute: 0 10*3/uL (ref 0.0–0.1)
Basophils Relative: 0 %
Eosinophils Absolute: 0.3 10*3/uL (ref 0.0–0.5)
Eosinophils Relative: 3 %
HCT: 23.3 % — ABNORMAL LOW (ref 36.0–46.0)
Hemoglobin: 7.2 g/dL — ABNORMAL LOW (ref 12.0–15.0)
Immature Granulocytes: 1 %
Lymphocytes Relative: 12 %
Lymphs Abs: 1 10*3/uL (ref 0.7–4.0)
MCH: 24.5 pg — ABNORMAL LOW (ref 26.0–34.0)
MCHC: 30.9 g/dL (ref 30.0–36.0)
MCV: 79.3 fL — ABNORMAL LOW (ref 80.0–100.0)
Monocytes Absolute: 0.5 10*3/uL (ref 0.1–1.0)
Monocytes Relative: 6 %
Neutro Abs: 6.6 10*3/uL (ref 1.7–7.7)
Neutrophils Relative %: 78 %
Platelets: 104 10*3/uL — ABNORMAL LOW (ref 150–400)
RBC: 2.94 MIL/uL — ABNORMAL LOW (ref 3.87–5.11)
RDW: 23.1 % — ABNORMAL HIGH (ref 11.5–15.5)
WBC: 8.4 10*3/uL (ref 4.0–10.5)
nRBC: 0.5 % — ABNORMAL HIGH (ref 0.0–0.2)

## 2022-09-19 LAB — COMPREHENSIVE METABOLIC PANEL
ALT: 20 U/L (ref 0–44)
AST: 48 U/L — ABNORMAL HIGH (ref 15–41)
Albumin: 2.2 g/dL — ABNORMAL LOW (ref 3.5–5.0)
Alkaline Phosphatase: 76 U/L (ref 38–126)
Anion gap: 10 (ref 5–15)
BUN: 37 mg/dL — ABNORMAL HIGH (ref 8–23)
CO2: 20 mmol/L — ABNORMAL LOW (ref 22–32)
Calcium: 7.8 mg/dL — ABNORMAL LOW (ref 8.9–10.3)
Chloride: 106 mmol/L (ref 98–111)
Creatinine, Ser: 1.58 mg/dL — ABNORMAL HIGH (ref 0.44–1.00)
GFR, Estimated: 36 mL/min — ABNORMAL LOW (ref >60)
Glucose, Bld: 134 mg/dL — ABNORMAL HIGH (ref 70–99)
Potassium: 4.1 mmol/L (ref 3.5–5.1)
Sodium: 136 mmol/L (ref 135–145)
Total Bilirubin: 1.8 mg/dL — ABNORMAL HIGH (ref 0.3–1.2)
Total Protein: 4.3 g/dL — ABNORMAL LOW (ref 6.5–8.1)

## 2022-09-19 LAB — RETICULOCYTES
Immature Retic Fract: 10.5 % (ref 2.3–15.9)
RBC.: 2.77 MIL/uL — ABNORMAL LOW (ref 3.87–5.11)
Retic Count, Absolute: 100.8 10*3/uL (ref 19.0–186.0)
Retic Ct Pct: 3.6 % — ABNORMAL HIGH (ref 0.4–3.1)

## 2022-09-19 LAB — IRON AND TIBC
Iron: 95 ug/dL (ref 28–170)
Saturation Ratios: 87 % — ABNORMAL HIGH (ref 10.4–31.8)
TIBC: 109 ug/dL — ABNORMAL LOW (ref 250–450)
UIBC: 14 ug/dL

## 2022-09-19 LAB — DIRECT ANTIGLOBULIN TEST (NOT AT ARMC)
DAT, IgG: NEGATIVE
DAT, complement: NEGATIVE

## 2022-09-19 LAB — URINE CULTURE
Culture: NO GROWTH
Special Requests: NORMAL

## 2022-09-19 LAB — LACTATE DEHYDROGENASE: LDH: 241 U/L — ABNORMAL HIGH (ref 98–192)

## 2022-09-19 LAB — GLUCOSE, CAPILLARY: Glucose-Capillary: 117 mg/dL — ABNORMAL HIGH (ref 70–99)

## 2022-09-19 LAB — FERRITIN: Ferritin: 583 ng/mL — ABNORMAL HIGH (ref 11–307)

## 2022-09-19 LAB — APTT: aPTT: 36 seconds (ref 24–36)

## 2022-09-19 LAB — PROCALCITONIN: Procalcitonin: 6.44 ng/mL

## 2022-09-19 MED ORDER — POLYETHYLENE GLYCOL 3350 17 G PO PACK
17.0000 g | PACK | Freq: Every day | ORAL | Status: DC
  Administered 2022-09-19 – 2022-09-24 (×5): 17 g via ORAL
  Filled 2022-09-19 (×6): qty 1

## 2022-09-19 MED ORDER — SODIUM CHLORIDE 0.9 % IV SOLN
2.0000 g | Freq: Two times a day (BID) | INTRAVENOUS | Status: DC
  Administered 2022-09-19 – 2022-09-20 (×2): 2 g via INTRAVENOUS
  Filled 2022-09-19 (×2): qty 12.5

## 2022-09-19 NOTE — Progress Notes (Signed)
Pharmacy Antibiotic Note  Rachel Huber is a 67 y.o. female admitted on 09/17/2022 with sepsis.  Pharmacy has been consulted for Vancomycin/cefepime dosing.  Pt presented with AMS and fever. Empiric vanc/cefepime ordered. Ruling out sepsis. Reaction to retacrit possibly.  AF, PCT 6.44 BCX +GPC, BCID shows staph species Scr 1.41> 1.58  Plan: Continue Vancomycin 1.25g IV q24>>AUC 515, scr 1.58 Change Cefepime 2g IV q12h Level as needed  Height: '5\' 8"'$  (172.7 cm) Weight: (!) 140 kg (308 lb 10.3 oz) IBW/kg (Calculated) : 63.9  Temp (24hrs), Avg:98.8 F (37.1 C), Min:98.4 F (36.9 C), Max:99.6 F (37.6 C)  Recent Labs  Lab 09/17/22 1322 09/17/22 2111 09/17/22 2253 09/18/22 1253 09/18/22 1456 09/19/22 0359  WBC 4.7 16.0*  --   --   --  8.4  CREATININE  --  1.41*  --   --   --  1.58*  LATICACIDVEN  --  1.7 1.5 0.6 1.1  --      Estimated Creatinine Clearance: 53.5 mL/min (A) (by C-G formula based on SCr of 1.58 mg/dL (H)).    Allergies  Allergen Reactions   Gramineae Pollens Other (See Comments)    Sneezing, Running nose   Latex Rash    Contact rash    Antimicrobials this admission: 1/31 vanc>> 1/31 cefepime>>  Dose adjustments this admission:   Microbiology results: 2/2 BCX: ngtd 2/1 Peritoneal fluid: no growth to date 1/31BCX: GPC , staph species on BCID 1/31 UCX no growth  Isac Sarna, BS Pharm D, BCPS Clinical Pharmacist 09/19/2022 9:18 AM

## 2022-09-19 NOTE — Assessment & Plan Note (Signed)
Stable, afebrile and normotensive, improved leukocytosis - Sepsis ruled out -Met SIRS criteria on admission  Bacteremic  w staph capitis on 09/17/2022 -- Continue cefazolin per ID -Repeating blood cultures today 09/19/2022 >>> no growth to date   -Discontinue Flagyl/Rocephin, azithromycin -Consulting ID, Dr. Willette Pa. Baxter Flattery following closely  -Planning for port a catheter removal, PICC line placement - TEE -to rule out endocarditis

## 2022-09-19 NOTE — Progress Notes (Signed)
PROGRESS NOTE    Patient: Rachel Huber                            PCP: [name], MD                    DOB: 1956/02/18            DOA: 09/17/2022 JKD:[id number]             DOS: 09/19/2022, 11:35 AM   LOS: 1 day   Date of Service: The patient was seen and examined on 09/19/2022  Subjective:   The patient was seen and examined this morning. Hemodynamically stable. -Mentation has improved  Brief Narrative:   Rachel Huber is a 67 year old female with significant history of beta thalassemia minor (status post first treatment with IV Retacrit,on 1/31), aslo H/o cirrhosis NASH,with paracentesis 1/30, CKD 3B, HLD, GERD, internal hemorrhoids, OSA-CPAP, anxiety/depression, chronic anemia -with multiple PRBC transfusion, chronic hemorrhoid with bleeds, insomnia, ....   On 09/17/2022 patient received IV Retacrit through her port a cath in oncology office for her beta thalassemia, also has received iron infusion secondary to chronic anemia, also had paracentesis 09/16/2022.  Proximately 45 minutes after treatment with Retacrit follow-up fever 104.6, was tachycardic with a pulse of 102, blood pressure 104/60 with RR of 17, O2 sat of 95%  Of note patient has chronic ascites, [name] liver cirrhosis requiring intermittent paracentesis, recent EGD revealed portal hypertension, patient had TIPS procedure on Dec 26, 2021 revision in July 2026 2023.  She was subsequently asked to admit for SARS criteria, to rule out sepsis.     Assessment & Plan:   Principal Problem:   SIRS (systemic inflammatory response syndrome) (HCC) Active Problems:   Fever   Acute on chronic blood loss anemia   Acute kidney injury superimposed on CKD (HCC)   Depression with anxiety   HLD (hyperlipidemia)   Thrombocytopenia (HCC)   Chronic kidney disease, stage 3b (HCC)   Thalassemia minor   Type 2 diabetes mellitus with hyperlipidemia (HCC)   OSA on CPAP   Obesity (BMI 30-39.9)   Sepsis  ([place])     Assessment and Plan: * SIRS (systemic inflammatory response syndrome) (HCC) -Meeting SIRS criteria versus adverse reaction to IV medication of Retacrit,   Ruling out sepsis-initiate sepsis protocol -IV fluid resuscitation and broad-spectrum antibiotic, cultures  Acute on chronic blood loss anemia -Likely due to thalassemia minor -Status post 2U PRBC transfusion 09/18/2022 -Hemoglobin 7.1, 6.7  >>> 7.2   --History of frequent blood transfusion,  -Baseline hemoglobin: 5.8-8.7    Fever Stable vitals today Blood pressure (!) 118/53, HR 74, temp. 98.5 F (36.9 C), RR 20, SpO2 98 % on RA  -POA: Tmax 104.6,  -Met SIRS criteria,--- this protocol -Ruling out sepsis reaction to IV medication Retacrit,   Acute kidney injury superimposed on CKD ([place]) -Monitoring -Avoiding nephrotoxins  Lab Results  Component Value Date   CREATININE 1.58 (H) 09/19/2022   CREATININE 1.41 (H) 09/17/2022   CREATININE 1.43 (H) 09/05/2022   -Worsening kidney function, continue with gentle IV fluid hydration  Depression with anxiety -Stable continue home medication of sertraline, Neurontin -As needed Xanax  Thrombocytopenia (HCC) - Platelets 116, 123, no signs of bleeding, monitoring closely For any sites of bleeding or platelets less than 50 will transfuse platelets -Monitoring  HLD (hyperlipidemia) -Sinew statins  Chronic kidney disease, stage 3b ([place]) Monitoring BUN/creatinine closely -Nephrotoxins -Starting  IV fluids,  Thalassemia minor -Close follow-up with oncologist -Status post first treatment with IV Retacrit, on 09/17/2022 -Post infusion reaction with fever -Discussed the case with Dr. 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 -Advised close follow-up with PCP regarding weight loss programs -Clear diet and exercise recommended  OSA on CPAP -Continue supplemental oxygen, CPAP nightly  Type 2 diabetes mellitus with hyperlipidemia (HCC) -This morning glucose 168, -Patient is not on any diabetic medication -Will monitor blood sugars closely -Last hemoglobin A1C 5.5 months ago  -Continue statins       ---------------------------------------------------------------------------------------------------------------------------------------- Nutritional status:  The patient's BMI is: Body mass index is 46.93 kg/m. I agree with the assessment and plan as outlined ----------------------------------------------------------------------------------------------------------------------------------------- Cultures; Blood Cultures x 2 >> 1/2 growing gram-positive cocci 09/19/2018 4 repeat blood cultures -------------------------------------------------------------------------------------------------------------------------------------  DVT prophylaxis:  Place and maintain sequential compression device Start: 09/18/22 0826 TED hose Start: 09/18/22 0735 SCDs Start: 09/18/22 0403   Code Status:   Code Status: Full Code  Family Communication: No family member present at bedside- attempt will be made to update daily The above findings and plan of care has been discussed with patient (and family)  in detail,  they expressed understanding and agreement of above. -Advance care planning has been discussed.   Admission status:   Status is: Inpatient Remains inpatient appropriate because: Needing IV antibiotics, blood transfusion, ruling out bacteremia   Disposition: From  - home              Planning for discharge in 2-3 days: to Home    Procedures:   No admission procedures for hospital encounter.   Antimicrobials:  Anti-infectives (From admission, onward)    Start     Dose/Rate Route Frequency Ordered Stop   09/19/22 2030  ceFEPIme (MAXIPIME) 2 g in sodium chloride 0.9 % 100 mL IVPB        2 g 200 mL/hr over 30 Minutes Intravenous Every 12 hours 09/19/22 0916     09/19/22 0000  vancomycin (VANCOREADY) IVPB 1250 mg/250 mL  Status:  Discontinued        1,250 mg 166.7 mL/hr over 90 Minutes Intravenous Every 24 hours 09/17/22 2335 09/18/22 0740   09/19/22 0000  vancomycin (VANCOREADY) IVPB 1750 mg/350 mL        1,750 mg 175 mL/hr over 120 Minutes Intravenous Every 24 hours 09/18/22 0755     09/18/22 1200  metroNIDAZOLE (FLAGYL) IVPB 500 mg        500 mg 100 mL/hr over 60 Minutes Intravenous Every 12 hours 09/18/22 0740 09/25/22 1159   09/18/22 0800  ceFEPIme (MAXIPIME) 2 g in sodium chloride 0.9 % 100 mL IVPB  Status:  Discontinued        2 g 200 mL/hr over 30 Minutes Intravenous Every 8 hours 09/17/22 2335 09/18/22 0740   09/18/22 0800  ceFEPIme (MAXIPIME) 2 g in sodium chloride 0.9 % 100 mL IVPB  Status:  Discontinued        2 g 200 mL/hr over 30 Minutes Intravenous  Once 09/18/22 0740 09/18/22 0750   09/18/22 0800  ceFEPIme (MAXIPIME) 2 g in sodium chloride 0.9 %  100 mL IVPB  Status:  Discontinued        2 g 200 mL/hr over 30 Minutes Intravenous Every 8 hours 09/18/22 0750 09/19/22 0916   09/18/22 0745  vancomycin (VANCOCIN) IVPB 1000 mg/200 mL premix  Status:  Discontinued        1,000 mg 200 mL/hr over 60 Minutes Intravenous  Once 09/18/22 0740 09/18/22 0747   09/17/22 2345  vancomycin (VANCOCIN) IVPB 1000 mg/200 mL premix        1,000 mg 200 mL/hr over 60 Minutes Intravenous  Once 09/17/22 2335 09/18/22 0341   09/17/22 2300  ceFEPIme (MAXIPIME) 2 g in sodium chloride 0.9 % 100 mL IVPB        2 g 200 mL/hr over 30 Minutes Intravenous  Once 09/17/22 2255  09/17/22 2347   09/17/22 2300  metroNIDAZOLE (FLAGYL) IVPB 500 mg        500 mg 100 mL/hr over 60 Minutes Intravenous  Once 09/17/22 2255 09/18/22 0051   09/17/22 2300  vancomycin (VANCOCIN) IVPB 1000 mg/200 mL premix        1,000 mg 200 mL/hr over 60 Minutes Intravenous  Once 09/17/22 2255 09/18/22 0202        Medication:   atorvastatin  10 mg Oral Daily   Chlorhexidine Gluconate Cloth  6 each Topical Daily   cyanocobalamin  1,000 mcg Oral Daily   gabapentin  800 mg Oral BID   lactulose  20 g Oral Daily   midodrine  10 mg Oral TID WC   polyethylene glycol  17 g Oral Daily   sertraline  50 mg Oral Daily   sodium chloride flush  3 mL Intravenous Q12H    acetaminophen **OR** acetaminophen, albuterol, bisacodyl, diphenhydrAMINE, hydrALAZINE, HYDROmorphone (DILAUDID) injection, ipratropium, levalbuterol, ondansetron **OR** ondansetron (ZOFRAN) IV, oxyCODONE, senna-docusate, sodium phosphate, traMADol, traZODone   Objective:   Vitals:   09/18/22 1200 09/18/22 1304 09/18/22 1740 09/18/22 2212  BP: (!) 108/43 (!) 111/45 (!) 107/44 (!) 118/53  Pulse: 72 74 70 74  Resp: 17   20  Temp:  98.6 F (37 C) 98.4 F (36.9 C) 98.5 F (36.9 C)  TempSrc:  Oral    SpO2: 94% 98% 100% 98%  Weight:      Height:        Intake/Output Summary (Last 24 hours) at 09/19/2022 1135 Last data filed at 09/19/2022 0341 Gross per 24 hour  Intake 530 ml  Output --  Net 530 ml   Filed Weights   09/17/22 2120  Weight: (!) 140 kg     Physical examination:   General:  AAO x 3,  cooperative, no distress;   HEENT:  Normocephalic, PERRL, otherwise with in Normal limits   Neuro:  CNII-XII intact. , normal motor and sensation, reflexes intact   Lungs:   Clear to auscultation BL, Respirations unlabored,  No wheezes / crackles  Cardio:    S1/S2, RRR, No murmure, No Rubs or Gallops   Abdomen:  Soft, non-tender, bowel sounds active all four quadrants, no guarding or peritoneal signs.  Muscular   skeletal:  Limited exam -global generalized weaknesses - in bed, able to move all 4 extremities,   2+ pulses,  symmetric, No pitting edema  Skin:  Dry, warm to touch, negative for any Rashes, Port catheter placed and functional right upper chest area  Wounds: Please see nursing documentation         ------------------------------------------------------------------------------------------------------------------------------------------    LABs:     Latest Ref Rng & Units  09/19/2022    3:59 AM 09/17/2022    9:11 PM 09/17/2022    1:22 PM  CBC  WBC 4.0 - 10.5 K/uL 8.4  16.0  4.7   Hemoglobin 12.0 - 15.0 g/dL 7.2  6.7  7.1   Hematocrit 36.0 - 46.0 % 23.3  20.8  22.6   Platelets 150 - 400 K/uL 104  123  116       Latest Ref Rng & Units 09/19/2022    3:59 AM 09/17/2022    9:11 PM 09/05/2022   11:08 AM  CMP  Glucose 70 - 99 mg/dL 134  168  132   BUN 8 - 23 mg/dL 37  30  28   Creatinine 0.44 - 1.00 mg/dL 1.58  1.41  1.43   Sodium 135 - 145 mmol/L 136  134  138   Potassium 3.5 - 5.1 mmol/L 4.1  4.6  4.1   Chloride 98 - 111 mmol/L 106  107  109   CO2 22 - 32 mmol/L '20  20  23   '$ Calcium 8.9 - 10.3 mg/dL 7.8  7.9  8.3   Total Protein 6.5 - 8.1 g/dL 4.3  4.8    Total Bilirubin 0.3 - 1.2 mg/dL 1.8  3.0    Alkaline Phos 38 - 126 U/L 76  98    AST 15 - 41 U/L 48  53    ALT 0 - 44 U/L 20  23         Micro Results Recent Results (from the past 240 hour(s))  Culture, blood (Routine x 2)     Status: None (Preliminary result)   Collection Time: 09/17/22  9:11 PM   Specimen: Porta Cath; Blood  Result Value Ref Range Status   Specimen Description   Final    PORTA CATH Performed at [facility], [address], [place], [place] [address]    Special Requests   Final    BOTTLES DRAWN AEROBIC AND ANAEROBIC Blood Culture adequate volume Performed at [facility], [address] [place], [place] [address]    Culture  Setup Time   Final    GRAM POSITIVE COCCI Gram Stain Report Called  to,Read Back By and Verified With: J HUMPHRIES QT [id number] K FORSYTH AEROBIC BOTTLE ONLY ANAEROBIC BOTTLE ALSO ON 09/18/22 AT 1920 BY LOY,C CRITICAL RESULT CALLED TO, READ BACK BY AND VERIFIED WITH: RN JADA MUSE ON 09/18/22 @ 2106 BY DRT Performed at [facility], [place]. [address] [place], [place] [address]    Culture GRAM POSITIVE COCCI IN CLUSTERS  Final   Report Status PENDING  Incomplete  Blood Culture ID Panel (Reflexed)     Status: Abnormal   Collection Time: 09/17/22  9:11 PM  Result Value Ref Range Status   Enterococcus faecalis NOT DETECTED NOT DETECTED Final   Enterococcus Faecium NOT DETECTED NOT DETECTED Final   Listeria monocytogenes NOT DETECTED NOT DETECTED Final   Staphylococcus species DETECTED (A) NOT DETECTED Final    Comment: CRITICAL RESULT CALLED TO, READ BACK BY AND VERIFIED WITH: RN JADA MUSE ON 09/18/22 @ 2106 BY DRT    Staphylococcus aureus (BCID) NOT DETECTED NOT DETECTED Final   Staphylococcus epidermidis NOT DETECTED NOT DETECTED Final   Staphylococcus lugdunensis NOT DETECTED NOT DETECTED Final   Streptococcus species NOT DETECTED NOT DETECTED Final   Streptococcus agalactiae NOT DETECTED NOT DETECTED Final   Streptococcus pneumoniae NOT DETECTED NOT DETECTED Final   Streptococcus pyogenes NOT DETECTED NOT DETECTED Final  A.calcoaceticus-baumannii NOT DETECTED NOT DETECTED Final   Bacteroides fragilis NOT DETECTED NOT DETECTED Final   Enterobacterales NOT DETECTED NOT DETECTED Final   Enterobacter cloacae complex NOT DETECTED NOT DETECTED Final   Escherichia coli NOT DETECTED NOT DETECTED Final   Klebsiella aerogenes NOT DETECTED NOT DETECTED Final   Klebsiella oxytoca NOT DETECTED NOT DETECTED Final   Klebsiella pneumoniae NOT DETECTED NOT DETECTED Final   Proteus species NOT DETECTED NOT DETECTED Final   Salmonella species NOT DETECTED NOT DETECTED Final   Serratia marcescens NOT DETECTED NOT DETECTED Final   Haemophilus influenzae NOT DETECTED  NOT DETECTED Final   Neisseria meningitidis NOT DETECTED NOT DETECTED Final   Pseudomonas aeruginosa NOT DETECTED NOT DETECTED Final   Stenotrophomonas maltophilia NOT DETECTED NOT DETECTED Final   Candida albicans NOT DETECTED NOT DETECTED Final   Candida auris NOT DETECTED NOT DETECTED Final   Candida glabrata NOT DETECTED NOT DETECTED Final   Candida krusei NOT DETECTED NOT DETECTED Final   Candida parapsilosis NOT DETECTED NOT DETECTED Final   Candida tropicalis NOT DETECTED NOT DETECTED Final   Cryptococcus neoformans/gattii NOT DETECTED NOT DETECTED Final    Comment: Performed at [facility], [place]. [address] [place], [place] [address]  Urine Culture (for pregnant, neutropenic or urologic patients or patients with an indwelling urinary catheter)     Status: None   Collection Time: 09/17/22  9:20 PM   Specimen: Urine, Clean Catch  Result Value Ref Range Status   Specimen Description   Final    URINE, CLEAN CATCH Performed at [facility], [address] [place], [place] [address]    Special Requests   Final    Normal Performed at [facility], [address] [place], [place] [address]    Culture   Final    NO GROWTH Performed at [facility], [place] [address], [place], [place] [address]    Report Status 09/19/2022 FINAL  Final  Culture, blood (Routine x 2)     Status: None (Preliminary result)   Collection Time: 09/17/22  9:40 PM   Specimen: Left Antecubital; Blood  Result Value Ref Range Status   Specimen Description   Final    LEFT ANTECUBITAL Performed at [facility], [address] [place], [place] [address]    Special Requests   Final    BOTTLES DRAWN AEROBIC AND ANAEROBIC Blood Culture adequate volume Performed at [facility] [address], [place], [place] [address]    Culture  Setup Time   Final    AEROBIC BOTTLE ONLY GRAM POSITIVE COCCI CRITICAL VALUE NOTED.  VALUE IS CONSISTENT WITH PREVIOUSLY REPORTED AND CALLED VALUE. ANAEROBIC BOTTLE GRAM  POSITIVE COCCI    Culture   Final    CULTURE REINCUBATED FOR BETTER GROWTH Performed at [facility], [place] [address], [place], [place] [address]    Report Status PENDING  Incomplete  Resp panel by RT-PCR (RSV, Flu A&B, Covid) Anterior Nasal Swab     Status: None   Collection Time: 09/17/22 10:53 PM   Specimen: Anterior Nasal Swab  Result Value Ref Range Status   SARS Coronavirus 2 by RT PCR NEGATIVE NEGATIVE Final    Comment: (NOTE) SARS-CoV-2 target nucleic acids are NOT DETECTED.  The SARS-CoV-2 RNA is generally detectable in upper respiratory specimens during the acute phase of infection. The lowest concentration of SARS-CoV-2 viral copies this assay can detect is 138 copies/mL. A negative result does not preclude SARS-Cov-2 infection and should not  be used as the sole basis for treatment or other patient management decisions. A negative result may occur with  improper specimen collection/handling, submission of specimen other than nasopharyngeal swab, presence of viral mutation(s) within the areas targeted by this assay, and inadequate number of viral copies(<138 copies/mL). A negative result must be combined with clinical observations, patient history, and epidemiological information. The expected result is Negative.  Fact Sheet for Patients:  EntrepreneurPulse.com.au  Fact Sheet for Healthcare Providers:  IncredibleEmployment.be  This test is no t yet approved or cleared by the [place] FDA and  has been authorized for detection and/or diagnosis of SARS-CoV-2 by FDA under an Emergency Use Authorization (EUA). This EUA will remain  in effect (meaning this test can be used) for the duration of the COVID-19 declaration under Section 564(b)(1) of the Act, 21 U.S.C.section 360bbb-3(b)(1), unless the authorization is terminated  or revoked sooner.       Influenza A by PCR NEGATIVE NEGATIVE Final   Influenza B by PCR NEGATIVE  NEGATIVE Final    Comment: (NOTE) The Xpert Xpress SARS-CoV-2/FLU/RSV plus assay is intended as an aid in the diagnosis of influenza from Nasopharyngeal swab specimens and should not be used as a sole basis for treatment. Nasal washings and aspirates are unacceptable for Xpert Xpress SARS-CoV-2/FLU/RSV testing.  Fact Sheet for Patients: EntrepreneurPulse.com.au  Fact Sheet for Healthcare Providers: IncredibleEmployment.be  This test is not yet approved or cleared by the [place] FDA and has been authorized for detection and/or diagnosis of SARS-CoV-2 by FDA under an Emergency Use Authorization (EUA). This EUA will remain in effect (meaning this test can be used) for the duration of the COVID-19 declaration under Section 564(b)(1) of the Act, 21 U.S.C. section 360bbb-3(b)(1), unless the authorization is terminated or revoked.     Resp Syncytial Virus by PCR NEGATIVE NEGATIVE Final    Comment: (NOTE) Fact Sheet for Patients: EntrepreneurPulse.com.au  Fact Sheet for Healthcare Providers: IncredibleEmployment.be  This test is not yet approved or cleared by the [place] FDA and has been authorized for detection and/or diagnosis of SARS-CoV-2 by FDA under an Emergency Use Authorization (EUA). This EUA will remain in effect (meaning this test can be used) for the duration of the COVID-19 declaration under Section 564(b)(1) of the Act, 21 U.S.C. section 360bbb-3(b)(1), unless the authorization is terminated or revoked.  Performed at [facility], [address] [place], [place] [address]   Culture, body fluid w Gram Stain-bottle     Status: None (Preliminary result)   Collection Time: 09/18/22  5:00 AM   Specimen: Peritoneal Washings  Result Value Ref Range Status   Specimen Description PERITONEAL  Final   Special Requests BOTTLES DRAWN AEROBIC AND ANAEROBIC 10CC  Final   Culture   Final    NO  GROWTH 1 DAY Performed at [facility], [address] [place], [place] [address]    Report Status PENDING  Incomplete  Gram stain     Status: None   Collection Time: 09/18/22  5:00 AM   Specimen: Peritoneal Washings  Result Value Ref Range Status   Specimen Description PERITONEAL  Final   Special Requests NONE  Final   Gram Stain   Final    CYTOSPIN SMEAR NO ORGANISMS SEEN WBC PRESENT,BOTH PMN AND MONONUCLEAR Performed at [facility], [address], [place], [place] [address]    Report Status 09/18/2022 FINAL  Final  Culture, blood (Routine X 2) w Reflex to ID Panel     Status: None (  Preliminary result)   Collection Time: 09/19/22 12:38 AM   Specimen: BLOOD  Result Value Ref Range Status   Specimen Description BLOOD BLOOD RIGHT WRIST  Final   Special Requests   Final    BOTTLES DRAWN AEROBIC AND ANAEROBIC Blood Culture adequate volume   Culture   Final    NO GROWTH < 12 HOURS Performed at [facility], [address], [place], [place] [address]    Report Status PENDING  Incomplete  Culture, blood (Routine X 2) w Reflex to ID Panel     Status: None (Preliminary result)   Collection Time: 09/19/22 12:40 AM   Specimen: BLOOD  Result Value Ref Range Status   Specimen Description BLOOD BLOOD RIGHT HAND  Final   Special Requests   Final    BOTTLES DRAWN AEROBIC AND ANAEROBIC Blood Culture adequate volume   Culture   Final    NO GROWTH < 12 HOURS Performed at [facility], [address] [place], [place] [address]    Report Status PENDING  Incomplete    Radiology Reports No results found.  SIGNED: Deatra James, MD, FHM. FAAFP. [facility]ac[facility] Pont[facility] - [facility] Time spent > 35 min.  In seeing, evaluating and examining the patient. Reviewing medical records, labs, drawn plan of care. [facility],  Pager (please use [email or web address] to page/ text) Please use Epic Secure Chat for non-urgent communication (7AM-7PM)  If 7PM-7AM, please contact  night-coverage [email or web address], 09/19/2022, 11:35 AM

## 2022-09-19 NOTE — TOC Progression Note (Signed)
Transition of Care [facility]) - Progression Note    Patient Details  Name: Rachel Huber MRN: [id number] Date of Birth: February 27, 1956  Transition of Care [facility]) CM/SW Contact  Ihor Gully, LCSW Phone Number: 09/19/2022, 12:34 PM  Clinical Narrative:    Discussed HH recommendation with patient. Patient states that she is active with OPPT in [place]. She would prefer to continue OPPT.    Expected Discharge Plan: Home/Self Care Barriers to Discharge: Continued Medical Work up  Expected Discharge Plan and Services       Living arrangements for the past 2 months: Single Family Home                                       Social Determinants of Health (SDOH) Interventions SDOH Screenings   Food Insecurity: No Food Insecurity (09/18/2022)  Housing: Low Risk  (09/18/2022)  Transportation Needs: No Transportation Needs (09/18/2022)  Utilities: Not At Risk (09/18/2022)  Alcohol Screen: Low Risk  (04/04/2022)  Depression (PHQ2-9): Low Risk  (04/04/2022)  Financial Resource Strain: Low Risk  (04/04/2022)  Physical Activity: Inactive (04/04/2022)  Social Connections: Moderately Isolated (04/04/2022)  Stress: No Stress Concern Present (04/04/2022)  Tobacco Use: Low Risk  (09/18/2022)    Readmission Risk Interventions    09/18/2022    2:19 PM 03/26/2022    2:40 PM  Readmission Risk Prevention Plan  Transportation Screening Complete Complete  Medication Review Press photographer) Complete Complete  PCP or Specialist appointment within 3-5 days of discharge Complete Complete  HRI or Home Care Consult Complete Complete  SW Recovery Care/Counseling Consult Complete Complete  Palliative Care Screening Not Applicable Not [place] Not Applicable Not Applicable

## 2022-09-20 DIAGNOSIS — R6510 Systemic inflammatory response syndrome (SIRS) of non-infectious origin without acute organ dysfunction: Secondary | ICD-10-CM | POA: Diagnosis not present

## 2022-09-20 LAB — COMPREHENSIVE METABOLIC PANEL
ALT: 21 U/L (ref 0–44)
AST: 44 U/L — ABNORMAL HIGH (ref 15–41)
Albumin: 2.1 g/dL — ABNORMAL LOW (ref 3.5–5.0)
Alkaline Phosphatase: 83 U/L (ref 38–126)
Anion gap: 6 (ref 5–15)
BUN: 37 mg/dL — ABNORMAL HIGH (ref 8–23)
CO2: 21 mmol/L — ABNORMAL LOW (ref 22–32)
Calcium: 7.5 mg/dL — ABNORMAL LOW (ref 8.9–10.3)
Chloride: 109 mmol/L (ref 98–111)
Creatinine, Ser: 1.7 mg/dL — ABNORMAL HIGH (ref 0.44–1.00)
GFR, Estimated: 33 mL/min — ABNORMAL LOW (ref >60)
Glucose, Bld: 155 mg/dL — ABNORMAL HIGH (ref 70–99)
Potassium: 3.8 mmol/L (ref 3.5–5.1)
Sodium: 136 mmol/L (ref 135–145)
Total Bilirubin: 1.8 mg/dL — ABNORMAL HIGH (ref 0.3–1.2)
Total Protein: 4.2 g/dL — ABNORMAL LOW (ref 6.5–8.1)

## 2022-09-20 LAB — CBC WITH DIFFERENTIAL/PLATELET
Abs Immature Granulocytes: 0.02 10*3/uL (ref 0.00–0.07)
Basophils Absolute: 0 10*3/uL (ref 0.0–0.1)
Basophils Relative: 0 %
Eosinophils Absolute: 0.5 10*3/uL (ref 0.0–0.5)
Eosinophils Relative: 7 %
HCT: 21.7 % — ABNORMAL LOW (ref 36.0–46.0)
Hemoglobin: 6.9 g/dL — CL (ref 12.0–15.0)
Immature Granulocytes: 0 %
Lymphocytes Relative: 13 %
Lymphs Abs: 0.9 10*3/uL (ref 0.7–4.0)
MCH: 25.2 pg — ABNORMAL LOW (ref 26.0–34.0)
MCHC: 31.8 g/dL (ref 30.0–36.0)
MCV: 79.2 fL — ABNORMAL LOW (ref 80.0–100.0)
Monocytes Absolute: 0.4 10*3/uL (ref 0.1–1.0)
Monocytes Relative: 6 %
Neutro Abs: 4.6 10*3/uL (ref 1.7–7.7)
Neutrophils Relative %: 74 %
Platelets: 110 10*3/uL — ABNORMAL LOW (ref 150–400)
RBC: 2.74 MIL/uL — ABNORMAL LOW (ref 3.87–5.11)
RDW: 22.9 % — ABNORMAL HIGH (ref 11.5–15.5)
WBC: 6.4 10*3/uL (ref 4.0–10.5)
nRBC: 0 % (ref 0.0–0.2)

## 2022-09-20 LAB — GLUCOSE, CAPILLARY
Glucose-Capillary: 116 mg/dL — ABNORMAL HIGH (ref 70–99)
Glucose-Capillary: 149 mg/dL — ABNORMAL HIGH (ref 70–99)

## 2022-09-20 LAB — PROCALCITONIN: Procalcitonin: 5.06 ng/mL

## 2022-09-20 MED ORDER — RISAQUAD PO CAPS
2.0000 | ORAL_CAPSULE | Freq: Three times a day (TID) | ORAL | Status: DC
  Administered 2022-09-20 – 2022-09-24 (×12): 2 via ORAL
  Filled 2022-09-20 (×12): qty 2

## 2022-09-20 MED ORDER — CIPROFLOXACIN HCL 250 MG PO TABS
500.0000 mg | ORAL_TABLET | Freq: Every day | ORAL | Status: DC
  Administered 2022-09-21 – 2022-09-24 (×3): 500 mg via ORAL
  Filled 2022-09-20 (×4): qty 2

## 2022-09-20 MED ORDER — SODIUM CHLORIDE 0.9% IV SOLUTION: Freq: Once | INTRAVENOUS | Status: AC

## 2022-09-20 NOTE — Consult Note (Signed)
[facility]    Date of Admission:  09/17/2022     Reason for Consult: Bacteremia     Referring Physician: Dr Roger Shelter  Current antibiotics: Cefepime Vancomycin  ASSESSMENT:    67 y.o. female admitted with:  Staph capitis bacteremia: Suspect this is a line related infection given temporal relationship from accessing her PAC to developing high fevers and postiive cultures from her PAC and peripheral blood. NASH cirrhosis with refractory ascites: Requiring multiple repeat paracentesis most recently 09/16/22 without evidence currently of SBP. Anemia with frequent transfusion requirements Beta thalassemia minor Chronic kidney disease  RECOMMENDATIONS:    Continue vancomycin Await Staph capitis susceptibilities Stop Cefepime Resume Ciprofloxacin '500mg'$  daily for SBP prophylaxis per outpatient hepatology notes Recommend having IR evaluate for PAC removal and line holiday If PAC unable to be removed, may consider treating through line infection and if that is the case would add antibiotic lock therapy if able Check Echo Dr Baxter Flattery will be back on Monday   Principal Problem:   SIRS (systemic inflammatory response syndrome) ([place]) Active Problems:   Type 2 diabetes mellitus with hyperlipidemia (HCC)   Thalassemia minor   HLD (hyperlipidemia)   Acute kidney injury superimposed on CKD ([place])   OSA on CPAP   Chronic kidney disease, stage 3b ([place])   Depression with anxiety   Fever   Acute on chronic blood loss anemia   Thrombocytopenia (HCC)   Obesity (BMI 30-39.9)   Sepsis ([place])   MEDICATIONS:    Scheduled Meds:  acidophilus  2 capsule Oral TID   atorvastatin  10 mg Oral Daily   Chlorhexidine Gluconate Cloth  6 each Topical Daily   cyanocobalamin  1,000 mcg Oral Daily   gabapentin  800 mg Oral BID   lactulose  20 g Oral Daily   midodrine  10 mg Oral TID WC   polyethylene glycol  17 g Oral Daily   sertraline  50 mg Oral Daily   sodium  chloride flush  3 mL Intravenous Q12H   Continuous Infusions:  ceFEPime (MAXIPIME) IV Stopped (09/20/22 0805)   vancomycin 175 mL/hr at 09/20/22 0840   PRN Meds:.acetaminophen **OR** acetaminophen, albuterol, diphenhydrAMINE, hydrALAZINE, HYDROmorphone (DILAUDID) injection, ipratropium, levalbuterol, ondansetron **OR** ondansetron (ZOFRAN) IV, oxyCODONE, senna-docusate, sodium phosphate, traMADol, traZODone  HPI:    Rachel Huber is a 67 y.o. female who is admitted at [facility] since 09/17/22.  She received her first dose of IV Retacrit on 09/17/22 via her PAC in the oncology office.  THe Port was placed in Oct 2023.  Following this infusion, she was febrile, tachycardic, and BP was low.  WBC also elevated.  She additionally has a history of NASH cirrhosis complicated by recurrent ascites.  Her most recent paracentesis was on 09/16/22 with 8 liters of fluid removed.  She was admitted and started on broad spectrum antibiotics.  Her fevers have improved and her WBC has normalized.    Her admission blood cx from [facility] are growing Staph capitis and peripheral cultures are growing the same.  Susceptibilities are pending. Repeat cultures drawn yesterday are NGTD.     Past Medical History:  Diagnosis Date   Anemia in chronic kidney disease (CKD) 02/07/2022   Anxiety    Arthritis    Cirrhosis of liver ([place])    Diabetes mellitus without complication (HCC)    Dyspnea    DOE   GERD (gastroesophageal reflux disease)    Grade IV internal hemorrhoids    Contingency plan for  any future admissions for severe anemia in the setting of persistent GI bleed:  nuclear medicine tagged RBC scan to assess for location of GI bleeding - if from rectum, would then proceed to angiogram with possible repeat embolization.  Please notify IR in this case.   Hepatitis    PAST   Hypertension    Neuropathy    Neuropathy, diabetic (HCC)    Pneumonia    Sinus complaint    Sleep apnea    CPAP   Thalassemia minor 1992     Social History   Tobacco Use   Smoking status: Never   Smokeless tobacco: Never  Vaping Use   Vaping Use: Never used  Substance Use Topics   Alcohol use: No   Drug use: Never    Family History  Problem Relation Age of Onset   Breast cancer Mother 15   Lymphoma Mother    Diabetes Father    Kidney cancer Father    Heart disease Father    Diabetes Sister    Breast cancer Sister 61    Allergies  Allergen Reactions   Gramineae Pollens Other (See Comments)    Sneezing, Running nose   Latex Rash    Contact rash     OBJECTIVE:   Blood pressure (!) 114/46, pulse 67, temperature 98.1 F (36.7 C), temperature source Oral, resp. rate 16, height '5\' 8"'$  (1.727 m), weight (!) 136.4 kg, SpO2 97 %. Body mass index is 45.74 kg/m.    Lab Results: Lab Results  Component Value Date   WBC 6.4 09/20/2022   HGB 6.9 (LL) 09/20/2022   HCT 21.7 (L) 09/20/2022   MCV 79.2 (L) 09/20/2022   PLT 110 (L) 09/20/2022    Lab Results  Component Value Date   NA 136 09/20/2022   K 3.8 09/20/2022   CO2 21 (L) 09/20/2022   GLUCOSE 155 (H) 09/20/2022   BUN 37 (H) 09/20/2022   CREATININE 1.70 (H) 09/20/2022   CALCIUM 7.5 (L) 09/20/2022   GFRNONAA 33 (L) 09/20/2022   GFRAA >60 03/04/2018    Lab Results  Component Value Date   ALT 21 09/20/2022   AST 44 (H) 09/20/2022   ALKPHOS 83 09/20/2022   BILITOT 1.8 (H) 09/20/2022       Component Value Date/Time   CRP 0.8 09/13/2020 0625       Component Value Date/Time   ESRSEDRATE 18 02/13/2021 1440    I have reviewed the micro and lab results in Epic.  Imaging: No results found.   Imaging independently reviewed in Epic.  [facility] [phone] pager 09/20/2022, 4:02 PM

## 2022-09-20 NOTE — Progress Notes (Signed)
Critical result notification  09/20/22 0569  Provider Notification  Provider Name/Title Dr. Carlynn Purl  Date Provider Notified 09/20/22  Time Provider Notified [phone]  Method of Notification Page (via secure chat, md preference)  Notification Reason Critical Result  Test performed and critical result Hemoglobin 6.9  Date Critical Result Received 09/20/22  Time Critical Result Received 0618

## 2022-09-20 NOTE — Progress Notes (Signed)
PROGRESS NOTE    Patient: Rachel Huber                            PCP: [name], MD                    DOB: [birth year]            DOA: 09/17/2022 ZOX:[id number]             DOS: 09/20/2022, 12:25 PM   LOS: 2 days   Date of Service: The patient was seen and examined on 09/20/2022  Subjective:   The patient was seen and examined awake alert oriented x 4 no acute distress, hemodynamically stable Complain of generalized weaknesses, hemoglobin down to 6.9 again  Agreed to further blood transfusion.  Brief Narrative:   REILYN NELSON is a 67 year old female with significant history of beta thalassemia minor (status post first treatment with IV Retacrit,on 1/31), aslo H/o cirrhosis NASH,with paracentesis 1/30, CKD 3B, HLD, GERD, internal hemorrhoids, OSA-CPAP, anxiety/depression, chronic anemia -with multiple PRBC transfusion, chronic hemorrhoid with bleeds, insomnia, ....   On 09/17/2022 patient received IV Retacrit through her port a cath in oncology office for her beta thalassemia, also has received iron infusion secondary to chronic anemia, also had paracentesis 09/16/2022.  Proximately 45 minutes after treatment with Retacrit follow-up fever 104.6, was tachycardic with a pulse of 102, blood pressure 104/60 with RR of 17, O2 sat of 95%  Of note patient has chronic ascites, [name] liver cirrhosis requiring intermittent paracentesis, recent EGD revealed portal hypertension, patient had TIPS procedure on Dec 26, 2021 revision in July 2026 2023.  She was subsequently asked to admit for SARS criteria, to rule out sepsis.     Assessment & Plan:   Principal Problem:   SIRS (systemic inflammatory response syndrome) (HCC) Active Problems:   Fever   Acute on chronic blood loss anemia   Acute kidney injury superimposed on CKD (HCC)   Depression with anxiety   HLD (hyperlipidemia)   Thrombocytopenia (HCC)   Chronic kidney disease, stage 3b (HCC)   Thalassemia minor   Type 2  diabetes mellitus with hyperlipidemia (HCC)   OSA on CPAP   Obesity (BMI 30-39.9)   Sepsis ([place])     Assessment and Plan: * SIRS (systemic inflammatory response syndrome) (HCC) - Resolved -WJX:BJYN criteria versus adverse reaction to IV medication of Retacrit,   Ruled out sepsis-initiate sepsis protocol -IV fluid resuscitation and broad-spectrum antibiotic, cultures  Acute on chronic blood loss anemia -Likely due to thalassemia minor -Status post ?1/2U PRBC transfusion 09/18/2022 -09/20/2022 agreed to 2U PRBC transfusion  -Hemoglobin 7.1, 6.7  >>> 7.2 >> 6.9  --History of frequent blood transfusion,  -Baseline hemoglobin: 5.8-8.7    Fever Met SIRS criteria on admission, sepsis ruled out  -POA: Tmax 104.6,  -Met SIRS criteria,--- this protocol -Ruling out sepsis reaction to IV medication Retacrit,   Acute kidney injury superimposed on CKD ([place]) -Monitoring -Avoiding nephrotoxins  Lab Results  Component Value Date   CREATININE 1.70 (H) 09/20/2022   CREATININE 1.58 (H) 09/19/2022   CREATININE 1.41 (H) 09/17/2022   -Worsening kidney function, continue with gentle IV fluid hydration  Depression with anxiety -Stable continue home medication of sertraline, Neurontin -As needed Xanax  Thrombocytopenia (HCC) - Platelets 116, 123, no signs of bleeding, monitoring closely For any sites of bleeding or platelets less than 50 will transfuse platelets -Monitoring  HLD (hyperlipidemia) -  Sinew statins  Chronic kidney disease, stage 3b (HCC) Monitoring BUN/creatinine closely -Nephrotoxins -Starting IV fluids,  Thalassemia minor -Close follow-up with oncologist -Status post first treatment with IV Retacrit, on 09/17/2022 -Post infusion reaction with fever -Discussed the case with Dr. 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 Juleen China for further evaluation recommendations  Obesity (BMI 30-39.9) Body mass index is 46.93 kg/m. -Advised close follow-up with PCP regarding weight loss programs -Clear diet and exercise recommended  OSA on CPAP -Continue supplemental oxygen, CPAP nightly  Type 2 diabetes mellitus with hyperlipidemia (HCC) -This morning glucose 168, -Patient is not on any diabetic medication -Will monitor blood sugars closely -Last hemoglobin A1C 5.5 months ago  -Continue statins       ---------------------------------------------------------------------------------------------------------------------------------------- Nutritional status:  The patient's BMI is: Body mass index is 45.74 kg/m. I agree with the assessment and plan as outlined ----------------------------------------------------------------------------------------------------------------------------------------- Cultures; Blood Cultures x 2 >> 1/2 growing gram-positive cocci 09/19/2018 4 repeat blood cultures -------------------------------------------------------------------------------------------------------------------------------------  DVT prophylaxis:  Place and maintain sequential compression device Start: 09/18/22 0826 TED hose Start: 09/18/22 0735 SCDs Start: 09/18/22 0403   Code Status:   Code Status: Full Code  Family Communication: No family member present at bedside- attempt will be made to update daily The above findings and plan of care has been discussed with patient (and family)  in detail,  they expressed understanding and  agreement of above. -Advance care planning has been discussed.   Admission status:   Status is: Inpatient Remains inpatient appropriate because: Needing IV antibiotics, blood transfusion, ruling out bacteremia   Disposition: From  - home             Planning for discharge in 2-3 days: to Home    Procedures:   No admission procedures for hospital encounter.   Antimicrobials:  Anti-infectives (From admission, onward)    Start     Dose/Rate Route Frequency Ordered Stop   09/19/22 2030  ceFEPIme (MAXIPIME) 2 g in sodium chloride 0.9 % 100 mL IVPB        2 g 200 mL/hr over 30 Minutes Intravenous Every 12 hours 09/19/22 0916     09/19/22 0000  vancomycin (VANCOREADY) IVPB 1250 mg/250 mL  Status:  Discontinued        1,250 mg 166.7 mL/hr over 90 Minutes Intravenous Every 24 hours 09/17/22 2335 09/18/22 0740   09/19/22 0000  vancomycin (VANCOREADY) IVPB 1750 mg/350 mL        1,750 mg 175 mL/hr over 120 Minutes Intravenous Every 24 hours 09/18/22 0755     09/18/22 1200  metroNIDAZOLE (FLAGYL) IVPB 500 mg  Status:  Discontinued        500 mg 100 mL/hr over 60 Minutes Intravenous Every 12 hours 09/18/22 0740 09/20/22 0745   09/18/22 0800  ceFEPIme (MAXIPIME) 2 g in sodium chloride 0.9 % 100 mL IVPB  Status:  Discontinued        2 g 200 mL/hr over 30 Minutes Intravenous Every 8 hours 09/17/22 2335 09/18/22 0740   09/18/22 0800  ceFEPIme (MAXIPIME) 2 g in sodium chloride 0.9 % 100  mL IVPB  Status:  Discontinued        2 g 200 mL/hr over 30 Minutes Intravenous  Once 09/18/22 0740 09/18/22 0750   09/18/22 0800  ceFEPIme (MAXIPIME) 2 g in sodium chloride 0.9 % 100 mL IVPB  Status:  Discontinued        2 g 200 mL/hr over 30 Minutes Intravenous Every 8 hours 09/18/22 0750 09/19/22 0916   09/18/22 0745  vancomycin (VANCOCIN) IVPB 1000 mg/200 mL premix  Status:  Discontinued        1,000 mg 200 mL/hr over 60 Minutes Intravenous  Once 09/18/22 0740 09/18/22 0747   09/17/22 2345  vancomycin  (VANCOCIN) IVPB 1000 mg/200 mL premix        1,000 mg 200 mL/hr over 60 Minutes Intravenous  Once 09/17/22 2335 09/18/22 0341   09/17/22 2300  ceFEPIme (MAXIPIME) 2 g in sodium chloride 0.9 % 100 mL IVPB        2 g 200 mL/hr over 30 Minutes Intravenous  Once 09/17/22 2255 09/17/22 2347   09/17/22 2300  metroNIDAZOLE (FLAGYL) IVPB 500 mg        500 mg 100 mL/hr over 60 Minutes Intravenous  Once 09/17/22 2255 09/18/22 0051   09/17/22 2300  vancomycin (VANCOCIN) IVPB 1000 mg/200 mL premix        1,000 mg 200 mL/hr over 60 Minutes Intravenous  Once 09/17/22 2255 09/18/22 0202        Medication:   acidophilus  2 capsule Oral TID   atorvastatin  10 mg Oral Daily   Chlorhexidine Gluconate Cloth  6 each Topical Daily   cyanocobalamin  1,000 mcg Oral Daily   gabapentin  800 mg Oral BID   lactulose  20 g Oral Daily   midodrine  10 mg Oral TID WC   polyethylene glycol  17 g Oral Daily   sertraline  50 mg Oral Daily   sodium chloride flush  3 mL Intravenous Q12H    acetaminophen **OR** acetaminophen, albuterol, diphenhydrAMINE, hydrALAZINE, HYDROmorphone (DILAUDID) injection, ipratropium, levalbuterol, ondansetron **OR** ondansetron (ZOFRAN) IV, oxyCODONE, senna-docusate, sodium phosphate, traMADol, traZODone   Objective:   Vitals:   09/20/22 0755 09/20/22 0939 09/20/22 1001 09/20/22 1146  BP: (!) 104/92 (!) 106/92 112/86 123/65  Pulse: 73 72 79 79  Resp:  '16 16 16  '$ Temp: 98.6 F (37 C) 98.4 F (36.9 C) 98.4 F (36.9 C) 98.6 F (37 C)  TempSrc: Oral Oral  Oral  SpO2: 97% 97%    Weight:      Height:        Intake/Output Summary (Last 24 hours) at 09/20/2022 1225 Last data filed at 09/20/2022 1146 Gross per 24 hour  Intake 1826.97 ml  Output 650 ml  Net 1176.97 ml   Filed Weights   09/17/22 2120 09/20/22 0633  Weight: (!) 140 kg (!) 136.4 kg     Physical examination:   General:  AAO x 3,  cooperative, no distress;   HEENT:  Normocephalic, PERRL, otherwise with in  Normal limits   Neuro:  CNII-XII intact. , normal motor and sensation, reflexes intact   Lungs:   Clear to auscultation BL, Respirations unlabored,  No wheezes / crackles  Cardio:    S1/S2, RRR, No murmure, No Rubs or Gallops   Abdomen:  Soft, non-tender, bowel sounds active all four quadrants, no guarding or peritoneal signs.  Muscular  skeletal:  Limited exam -global generalized weaknesses - in bed, able to move all 4 extremities,  2+ pulses,  symmetric, No pitting edema  Skin:  Right chest wall Port-A-Cath  dry, warm to touch, negative for any Rashes,  Wounds: Please see nursing documentation            ------------------------------------------------------------------------------------------------------------------------------------------    LABs:     Latest Ref Rng & Units 09/20/2022    5:08 AM 09/19/2022    3:59 AM 09/17/2022    9:11 PM  CBC  WBC 4.0 - 10.5 K/uL 6.4  8.4  16.0   Hemoglobin 12.0 - 15.0 g/dL 6.9  7.2  6.7   Hematocrit 36.0 - 46.0 % 21.7  23.3  20.8   Platelets 150 - 400 K/uL 110  104  123       Latest Ref Rng & Units 09/20/2022    5:08 AM 09/19/2022    3:59 AM 09/17/2022    9:11 PM  CMP  Glucose 70 - 99 mg/dL 155  134  168   BUN 8 - 23 mg/dL 37  37  30   Creatinine 0.44 - 1.00 mg/dL 1.70  1.58  1.41   Sodium 135 - 145 mmol/L 136  136  134   Potassium 3.5 - 5.1 mmol/L 3.8  4.1  4.6   Chloride 98 - 111 mmol/L 109  106  107   CO2 22 - 32 mmol/L '21  20  20   '$ Calcium 8.9 - 10.3 mg/dL 7.5  7.8  7.9   Total Protein 6.5 - 8.1 g/dL 4.2  4.3  4.8   Total Bilirubin 0.3 - 1.2 mg/dL 1.8  1.8  3.0   Alkaline Phos 38 - 126 U/L 83  76  98   AST 15 - 41 U/L 44  48  53   ALT 0 - 44 U/L '21  20  23        '$ Micro Results Recent Results (from the past 240 hour(s))  Culture, blood (Routine x 2)     Status: Abnormal (Preliminary result)   Collection Time: 09/17/22  9:11 PM   Specimen: Porta Cath; Blood  Result Value Ref Range Status   Specimen Description   Final     PORTA CATH Performed at [facility], [address] [place], [place] [address]    Special Requests   Final    BOTTLES DRAWN AEROBIC AND ANAEROBIC Blood Culture adequate volume Performed at [facility], [address], [place], [place] [address]    Culture  Setup Time   Final    GRAM POSITIVE COCCI Gram Stain Report Called to,Read Back By and Verified With: J HUMPHRIES NI [id number] K FORSYTH AEROBIC BOTTLE ONLY ANAEROBIC BOTTLE ALSO ON 09/18/22 AT 1920 BY LOY,C CRITICAL RESULT CALLED TO, READ BACK BY AND VERIFIED WITH: RN JADA MUSE ON 09/18/22 @ 2106 BY DRT Performed at [facility], [place]. [address] [place], [place] [address]    Culture STAPHYLOCOCCUS CAPITIS (A)  Final   Report Status PENDING  Incomplete  Blood Culture ID Panel (Reflexed)     Status: Abnormal   Collection Time: 09/17/22  9:11 PM  Result Value Ref Range Status   Enterococcus faecalis NOT DETECTED NOT DETECTED Final   Enterococcus Faecium NOT DETECTED NOT DETECTED Final   Listeria monocytogenes NOT DETECTED NOT DETECTED Final   Staphylococcus species DETECTED (A) NOT DETECTED Final    Comment: CRITICAL RESULT CALLED TO, READ BACK BY AND VERIFIED WITH: RN JADA MUSE ON 09/18/22 @ 2106 BY DRT    Staphylococcus aureus (BCID) NOT  DETECTED NOT DETECTED Final   Staphylococcus epidermidis NOT DETECTED NOT DETECTED Final   Staphylococcus lugdunensis NOT DETECTED NOT DETECTED Final   Streptococcus species NOT DETECTED NOT DETECTED Final   Streptococcus agalactiae NOT DETECTED NOT DETECTED Final   Streptococcus pneumoniae NOT DETECTED NOT DETECTED Final   Streptococcus pyogenes NOT DETECTED NOT DETECTED Final   A.calcoaceticus-baumannii NOT DETECTED NOT DETECTED Final   Bacteroides fragilis NOT DETECTED NOT DETECTED Final   Enterobacterales NOT DETECTED NOT DETECTED Final   Enterobacter cloacae complex NOT DETECTED NOT DETECTED Final   Escherichia coli NOT DETECTED NOT DETECTED Final   Klebsiella aerogenes NOT DETECTED  NOT DETECTED Final   Klebsiella oxytoca NOT DETECTED NOT DETECTED Final   Klebsiella pneumoniae NOT DETECTED NOT DETECTED Final   Proteus species NOT DETECTED NOT DETECTED Final   Salmonella species NOT DETECTED NOT DETECTED Final   Serratia marcescens NOT DETECTED NOT DETECTED Final   Haemophilus influenzae NOT DETECTED NOT DETECTED Final   Neisseria meningitidis NOT DETECTED NOT DETECTED Final   Pseudomonas aeruginosa NOT DETECTED NOT DETECTED Final   Stenotrophomonas maltophilia NOT DETECTED NOT DETECTED Final   Candida albicans NOT DETECTED NOT DETECTED Final   Candida auris NOT DETECTED NOT DETECTED Final   Candida glabrata NOT DETECTED NOT DETECTED Final   Candida krusei NOT DETECTED NOT DETECTED Final   Candida parapsilosis NOT DETECTED NOT DETECTED Final   Candida tropicalis NOT DETECTED NOT DETECTED Final   Cryptococcus neoformans/gattii NOT DETECTED NOT DETECTED Final    Comment: Performed at [facility], [address], [place], [place] [address]  Urine Culture (for pregnant, neutropenic or urologic patients or patients with an indwelling urinary catheter)     Status: None   Collection Time: 09/17/22  9:20 PM   Specimen: Urine, Clean Catch  Result Value Ref Range Status   Specimen Description   Final    URINE, CLEAN CATCH Performed at [facility], [address] [place], [place] [address]    Special Requests   Final    Normal Performed at [facility], [address] [place], [place] [address]    Culture   Final    NO GROWTH Performed at [facility], [place] [address], [place], [place] [address]    Report Status 09/19/2022 FINAL  Final  Culture, blood (Routine x 2)     Status: None (Preliminary result)   Collection Time: 09/17/22  9:40 PM   Specimen: Left Antecubital; Blood  Result Value Ref Range Status   Specimen Description   Final    LEFT ANTECUBITAL Performed at [facility], [address] [place], [place] [address]    Special Requests   Final     BOTTLES DRAWN AEROBIC AND ANAEROBIC Blood Culture adequate volume Performed at [facility], [address], [place], [place] [address]    Culture  Setup Time   Final    AEROBIC BOTTLE ONLY GRAM POSITIVE COCCI CRITICAL VALUE NOTED.  VALUE IS CONSISTENT WITH PREVIOUSLY REPORTED AND CALLED VALUE. ANAEROBIC BOTTLE GRAM POSITIVE COCCI    Culture   Final    GRAM POSITIVE COCCI IDENTIFICATION TO FOLLOW Performed at [facility], [place] [address], [place], [place] [address]    Report Status PENDING  Incomplete  Resp panel by RT-PCR (RSV, Flu A&B, Covid) Anterior Nasal Swab     Status: None   Collection Time: 09/17/22 10:53 PM   Specimen: Anterior Nasal Swab  Result Value Ref Range Status   SARS Coronavirus  2 by RT PCR NEGATIVE NEGATIVE Final    Comment: (NOTE) SARS-CoV-2 target nucleic acids are NOT DETECTED.  The SARS-CoV-2 RNA is generally detectable in upper respiratory specimens during the acute phase of infection. The lowest concentration of SARS-CoV-2 viral copies this assay can detect is 138 copies/mL. A negative result does not preclude SARS-Cov-2 infection and should not be used as the sole basis for treatment or other patient management decisions. A negative result may occur with  improper specimen collection/handling, submission of specimen other than nasopharyngeal swab, presence of viral mutation(s) within the areas targeted by this assay, and inadequate number of viral copies(<138 copies/mL). A negative result must be combined with clinical observations, patient history, and epidemiological information. The expected result is Negative.  Fact Sheet for Patients:  EntrepreneurPulse.com.au  Fact Sheet for Healthcare Providers:  IncredibleEmployment.be  This test is no t yet approved or cleared by the [place] FDA and  has been authorized for detection and/or diagnosis of SARS-CoV-2 by FDA under an Emergency Use Authorization  (EUA). This EUA will remain  in effect (meaning this test can be used) for the duration of the COVID-19 declaration under Section 564(b)(1) of the Act, 21 U.S.C.section 360bbb-3(b)(1), unless the authorization is terminated  or revoked sooner.       Influenza A by PCR NEGATIVE NEGATIVE Final   Influenza B by PCR NEGATIVE NEGATIVE Final    Comment: (NOTE) The Xpert Xpress SARS-CoV-2/FLU/RSV plus assay is intended as an aid in the diagnosis of influenza from Nasopharyngeal swab specimens and should not be used as a sole basis for treatment. Nasal washings and aspirates are unacceptable for Xpert Xpress SARS-CoV-2/FLU/RSV testing.  Fact Sheet for Patients: EntrepreneurPulse.com.au  Fact Sheet for Healthcare Providers: IncredibleEmployment.be  This test is not yet approved or cleared by the [place] FDA and has been authorized for detection and/or diagnosis of SARS-CoV-2 by FDA under an Emergency Use Authorization (EUA). This EUA will remain in effect (meaning this test can be used) for the duration of the COVID-19 declaration under Section 564(b)(1) of the Act, 21 U.S.C. section 360bbb-3(b)(1), unless the authorization is terminated or revoked.     Resp Syncytial Virus by PCR NEGATIVE NEGATIVE Final    Comment: (NOTE) Fact Sheet for Patients: EntrepreneurPulse.com.au  Fact Sheet for Healthcare Providers: IncredibleEmployment.be  This test is not yet approved or cleared by the [place] FDA and has been authorized for detection and/or diagnosis of SARS-CoV-2 by FDA under an Emergency Use Authorization (EUA). This EUA will remain in effect (meaning this test can be used) for the duration of the COVID-19 declaration under Section 564(b)(1) of the Act, 21 U.S.C. section 360bbb-3(b)(1), unless the authorization is terminated or revoked.  Performed at [facility], [address], [place], [place]  [address]   Culture, body fluid w Gram Stain-bottle     Status: None (Preliminary result)   Collection Time: 09/18/22  5:00 AM   Specimen: Peritoneal Washings  Result Value Ref Range Status   Specimen Description PERITONEAL  Final   Special Requests BOTTLES DRAWN AEROBIC AND ANAEROBIC 10CC  Final   Culture   Final    NO GROWTH 2 DAYS Performed at [facility], [address], [place], [place] [address]    Report Status PENDING  Incomplete  Gram stain     Status: None   Collection Time: 09/18/22  5:00 AM   Specimen: Peritoneal Washings  Result Value Ref Range Status   Specimen Description PERITONEAL  Final   Special  Requests NONE  Final   Gram Stain   Final    CYTOSPIN SMEAR NO ORGANISMS SEEN WBC PRESENT,BOTH PMN AND MONONUCLEAR Performed at [facility], [address], [place], [place] [address]    Report Status 09/18/2022 FINAL  Final  Culture, blood (Routine X 2) w Reflex to ID Panel     Status: None (Preliminary result)   Collection Time: 09/19/22 12:38 AM   Specimen: BLOOD  Result Value Ref Range Status   Specimen Description BLOOD BLOOD RIGHT WRIST  Final   Special Requests   Final    BOTTLES DRAWN AEROBIC AND ANAEROBIC Blood Culture adequate volume   Culture   Final    NO GROWTH 1 DAY Performed at [facility], [address] [place], [place] [address]    Report Status PENDING  Incomplete  Culture, blood (Routine X 2) w Reflex to ID Panel     Status: None (Preliminary result)   Collection Time: 09/19/22 12:40 AM   Specimen: BLOOD  Result Value Ref Range Status   Specimen Description BLOOD BLOOD RIGHT HAND  Final   Special Requests   Final    BOTTLES DRAWN AEROBIC AND ANAEROBIC Blood Culture adequate volume   Culture   Final    NO GROWTH 1 DAY Performed at [facility], [address], [place], [place] [address]    Report Status PENDING  Incomplete    Radiology Reports No results found.  SIGNED: Deatra James, MD, FHM. FAAFP. [facility] - [facility] Time spent > 35 min.  In seeing, evaluating and examining the patient. Reviewing medical records, labs, drawn plan of care. [facility],  Pager (please use [email or web address] to page/ text) Please use Epic Secure Chat for non-urgent communication (7AM-7PM)  If 7PM-7AM, please contact night-coverage [email or web address], 09/20/2022, 12:25 PM

## 2022-09-21 ENCOUNTER — Inpatient Hospital Stay (HOSPITAL_COMMUNITY): Payer: 59

## 2022-09-21 DIAGNOSIS — R7881 Bacteremia: Secondary | ICD-10-CM

## 2022-09-21 DIAGNOSIS — K7031 Alcoholic cirrhosis of liver with ascites: Secondary | ICD-10-CM | POA: Insufficient documentation

## 2022-09-21 DIAGNOSIS — R6510 Systemic inflammatory response syndrome (SIRS) of non-infectious origin without acute organ dysfunction: Secondary | ICD-10-CM | POA: Diagnosis not present

## 2022-09-21 LAB — ECHOCARDIOGRAM COMPLETE
AR max vel: 1.81 cm2
AV Area VTI: 1.96 cm2
AV Area mean vel: 1.84 cm2
AV Mean grad: 8 mmHg
AV Peak grad: 18 mmHg
Ao pk vel: 2.12 m/s
Area-P 1/2: 2.32 cm2
Calc EF: 66.9 %
Height: 68 in
MV M vel: 5.49 m/s
MV Peak grad: 120.6 mmHg
S' Lateral: 3.7 cm
Single Plane A2C EF: 66.8 %
Single Plane A4C EF: 67.7 %
Weight: 4806.4 oz

## 2022-09-21 LAB — CBC WITH DIFFERENTIAL/PLATELET
Abs Immature Granulocytes: 0.01 10*3/uL (ref 0.00–0.07)
Basophils Absolute: 0 10*3/uL (ref 0.0–0.1)
Basophils Relative: 1 %
Eosinophils Absolute: 0.5 10*3/uL (ref 0.0–0.5)
Eosinophils Relative: 10 %
HCT: 25.8 % — ABNORMAL LOW (ref 36.0–46.0)
Hemoglobin: 8.4 g/dL — ABNORMAL LOW (ref 12.0–15.0)
Immature Granulocytes: 0 %
Lymphocytes Relative: 18 %
Lymphs Abs: 0.9 10*3/uL (ref 0.7–4.0)
MCH: 25.8 pg — ABNORMAL LOW (ref 26.0–34.0)
MCHC: 32.6 g/dL (ref 30.0–36.0)
MCV: 79.1 fL — ABNORMAL LOW (ref 80.0–100.0)
Monocytes Absolute: 0.4 10*3/uL (ref 0.1–1.0)
Monocytes Relative: 8 %
Neutro Abs: 3 10*3/uL (ref 1.7–7.7)
Neutrophils Relative %: 63 %
Platelets: 131 10*3/uL — ABNORMAL LOW (ref 150–400)
RBC: 3.26 MIL/uL — ABNORMAL LOW (ref 3.87–5.11)
RDW: 22.2 % — ABNORMAL HIGH (ref 11.5–15.5)
WBC: 4.8 10*3/uL (ref 4.0–10.5)
nRBC: 0 % (ref 0.0–0.2)

## 2022-09-21 LAB — CULTURE, BLOOD (ROUTINE X 2): Special Requests: ADEQUATE

## 2022-09-21 LAB — COMPREHENSIVE METABOLIC PANEL
ALT: 22 U/L (ref 0–44)
AST: 41 U/L (ref 15–41)
Albumin: 2.1 g/dL — ABNORMAL LOW (ref 3.5–5.0)
Alkaline Phosphatase: 89 U/L (ref 38–126)
Anion gap: 7 (ref 5–15)
BUN: 35 mg/dL — ABNORMAL HIGH (ref 8–23)
CO2: 20 mmol/L — ABNORMAL LOW (ref 22–32)
Calcium: 7.6 mg/dL — ABNORMAL LOW (ref 8.9–10.3)
Chloride: 107 mmol/L (ref 98–111)
Creatinine, Ser: 1.52 mg/dL — ABNORMAL HIGH (ref 0.44–1.00)
GFR, Estimated: 38 mL/min — ABNORMAL LOW (ref >60)
Glucose, Bld: 123 mg/dL — ABNORMAL HIGH (ref 70–99)
Potassium: 3.7 mmol/L (ref 3.5–5.1)
Sodium: 134 mmol/L — ABNORMAL LOW (ref 135–145)
Total Bilirubin: 1.7 mg/dL — ABNORMAL HIGH (ref 0.3–1.2)
Total Protein: 4.4 g/dL — ABNORMAL LOW (ref 6.5–8.1)

## 2022-09-21 LAB — GLUCOSE, CAPILLARY
Glucose-Capillary: 106 mg/dL — ABNORMAL HIGH (ref 70–99)
Glucose-Capillary: 140 mg/dL — ABNORMAL HIGH (ref 70–99)

## 2022-09-21 MED ORDER — CEFAZOLIN SODIUM-DEXTROSE 2-4 GM/100ML-% IV SOLN
2.0000 g | Freq: Three times a day (TID) | INTRAVENOUS | Status: DC
  Administered 2022-09-21 – 2022-09-24 (×10): 2 g via INTRAVENOUS
  Filled 2022-09-21 (×10): qty 100

## 2022-09-21 NOTE — Assessment & Plan Note (Signed)
-  Routine ultrasound-guided paracentesis 09/22/22  -Yielding 7 L peritoneal fluid removal  -Post paracentesis according

## 2022-09-21 NOTE — Progress Notes (Signed)
Patient has half dime sized abrasion to the left buttock. I attempted to apply a small foam dressing and patient refused, stated " I have had one of those on before and it didn't work out very well". The nurse tech applied barrier cream. Patient tolerated well. Care plan ongoing.

## 2022-09-21 NOTE — Progress Notes (Signed)
  Echocardiogram 2D Echocardiogram has been performed.  Rachel Huber 09/21/2022, 4:18 PM

## 2022-09-21 NOTE — Progress Notes (Signed)
PROGRESS NOTE    Patient: Rachel Huber                            PCP: [name], MD                    DOB: 11-18-1955            DOA: 09/17/2022 WOE:[id number]             DOS: 09/21/2022, 10:39 AM   LOS: 3 days   Date of Service: The patient was seen and examined on 09/21/2022  Subjective:   The patient was seen and examined this morning, stable no acute distress, Hemodynamically stable-afebrile normotensive  Patient was informed of positive blood cultures of staph and the need for further workup including port a catheter removal, TEE, prolonged antibiotics  Brief Narrative:   Rachel Huber is a 67 year old female with significant history of beta thalassemia minor (status post first treatment with IV Retacrit,on 1/31), aslo H/o cirrhosis NASH,with paracentesis 1/30, CKD 3B, HLD, GERD, internal hemorrhoids, OSA-CPAP, anxiety/depression, chronic anemia -with multiple PRBC transfusion, chronic hemorrhoid with bleeds, insomnia, ....   On 09/17/2022 patient received IV Retacrit through her port a cath in oncology office for her beta thalassemia, also has received iron infusion secondary to chronic anemia, also had paracentesis 09/16/2022.  Proximately 45 minutes after treatment with Retacrit follow-up fever 104.6, was tachycardic with a pulse of 102, blood pressure 104/60 with RR of 17, O2 sat of 95%  Of note patient has chronic ascites, [name] liver cirrhosis requiring intermittent paracentesis, recent EGD revealed portal hypertension, patient had TIPS procedure on Dec 26, 2021 revision in July 2026 2023.  She was subsequently asked to admit for SARS criteria, to rule out sepsis.     Assessment & Plan:   Principal Problem:   SIRS (systemic inflammatory response syndrome) (HCC) Active Problems:   Acute on chronic blood loss anemia   Bacteremia   Acute kidney injury superimposed on CKD (HCC)   Depression with anxiety   HLD (hyperlipidemia)   Thrombocytopenia (HCC)    Chronic kidney disease, stage 3b (HCC)   Thalassemia minor   Type 2 diabetes mellitus with hyperlipidemia (HCC)   OSA on CPAP   Obesity (BMI 30-39.9)   Sepsis ([place])   Alcoholic cirrhosis of liver with ascites (HCC)     Assessment and Plan: * SIRS (systemic inflammatory response syndrome) (HCC) - Resolved -OIB:BCWU criteria versus adverse reaction to IV medication of Retacrit,   Ruled out sepsis-initiate sepsis protocol -IV fluid resuscitation and broad-spectrum antibiotic, cultures: Growing staph capitis ID consulted, continue vancomycin, p.o. Cipro And discussion for removal of Port-A-Cath  Bacteremia Staph capitis bacteremia -Blood cultures are growing, Staph capitis-pansensitive -IV vancomycin and Po Cipro >> IV antibiotic will be switched to cefazolin as of 09/21/2022 -Planning for repeating cultures, obtaining Echo -Dr. Juleen China consulted -Possible source of infection port a cath -Plan to remove port a catheter-poor IV access  -Planning for removal of port a cath in AM 09/21/22 If peripheral line is established -obtaining peripheral line has been challenging    -Discussed with Dr. Juleen China: Recommendations: -Try to obtain PIV access established. if so, then can proceed with Port removal and line holiday x 48-72 hrs before placing new line  If unable to get reliable access, then approach of line retention and treating through infection may be the option here in which case would plan to retain  line and treat for longer duration +/- antibiotic   Acute on chronic blood loss anemia -Likely due to thalassemia minor -Status post ?1/2U PRBC transfusion 09/18/2022 -09/20/2022 agreed to 2U PRBC transfusion  -Hemoglobin 7.1, 6.7  >>> 7.2 >> 6.9 >> 2 U PRBC >>> 8.4  --History of frequent blood transfusion,  -Baseline hemoglobin: 5.8-8.7    Fever Met SIRS criteria on admission, sepsis ruled out  -POA: Tmax 104.6,  -Met SIRS criteria,--- this protocol -Ruling out sepsis reaction  to IV medication Retacrit,   Acute kidney injury superimposed on CKD ([place]) -Monitoring -Avoiding nephrotoxins  Lab Results  Component Value Date   CREATININE 1.52 (H) 09/21/2022   CREATININE 1.70 (H) 09/20/2022   CREATININE 1.58 (H) 09/19/2022   -Worsening kidney function, continue with gentle IV fluid hydration  Depression with anxiety -Stable continue home medication of sertraline, Neurontin -As needed Xanax  Thrombocytopenia (HCC) - Platelets 116, 123, no signs of bleeding, monitoring closely For any sites of bleeding or platelets less than 50 will transfuse platelets -Monitoring  HLD (hyperlipidemia) -Sinew statins  Chronic kidney disease, stage 3b ([place]) Monitoring BUN/creatinine closely -Nephrotoxins -Starting IV fluids,  Thalassemia minor -Close follow-up with oncologist -Status post first treatment with IV Retacrit, on 09/17/2022 -Post infusion reaction with fever -Discussed the case with Dr. 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 Juleen China for further evaluation recommendations  Obesity (BMI 30-39.9) Body mass index is 46.93 kg/m. -Advised close follow-up with PCP regarding weight loss programs -Clear diet and exercise recommended  OSA on CPAP -Continue supplemental oxygen, CPAP nightly  Type 2 diabetes mellitus with hyperlipidemia (HCC) -This  morning glucose 168, -Patient is not on any diabetic medication -Will monitor blood sugars closely -Last hemoglobin A1C 5.5 months ago  -Continue statins  ---------------------------------------------------------------------------------------------------------------------------- Nutritional status:  The patient's BMI is: Body mass index is 45.68 kg/m. I agree with the assessment and plan as outlined ----------------------------------------------------------------------------------------------------------------------------------------- Cultures; Blood Cultures >>> GPC-Staph capitis-pansensitive 2/2/20204 Repeat blood cultures -------------------------------------------------------------------------------------------------------------------------------------  DVT prophylaxis:  Place and maintain sequential compression device Start: 09/18/22 0826 TED hose Start: 09/18/22 0735 SCDs Start: 09/18/22 0403   Code Status:   Code Status: Full Code  Family Communication: No family member present at bedside- attempt will be made to update daily The above findings and plan of care has been discussed with patient (and family)  in detail,  they expressed understanding and agreement of above. -Advance care planning has been discussed.   Admission status:   Status is: Inpatient Remains inpatient appropriate because: Needing IV antibiotics, blood transfusion, ruling out bacteremia   Disposition: From  - home             Planning for discharge in 2-3 days: to Home    Procedures:   No admission procedures for hospital encounter.   Antimicrobials:  Anti-infectives (From admission, onward)    Start     Dose/Rate Route Frequency Ordered Stop   09/21/22 1400  ceFAZolin (ANCEF) IVPB 2g/100 mL premix        2  g 200 mL/hr over 30 Minutes Intravenous Every 8 hours 09/21/22 1029     09/21/22 0800  ciprofloxacin (CIPRO) tablet 500 mg        500 mg Oral Daily with breakfast 09/20/22 1629      09/19/22 2030  ceFEPIme (MAXIPIME) 2 g in sodium chloride 0.9 % 100 mL IVPB  Status:  Discontinued        2 g 200 mL/hr over 30 Minutes Intravenous Every 12 hours 09/19/22 0916 09/20/22 1629   09/19/22 0000  vancomycin (VANCOREADY) IVPB 1250 mg/250 mL  Status:  Discontinued        1,250 mg 166.7 mL/hr over 90 Minutes Intravenous Every 24 hours 09/17/22 2335 09/18/22 0740   09/19/22 0000  vancomycin (VANCOREADY) IVPB 1750 mg/350 mL  Status:  Discontinued        1,750 mg 175 mL/hr over 120 Minutes Intravenous Every 24 hours 09/18/22 0755 09/21/22 1029   09/18/22 1200  metroNIDAZOLE (FLAGYL) IVPB 500 mg  Status:  Discontinued        500 mg 100 mL/hr over 60 Minutes Intravenous Every 12 hours 09/18/22 0740 09/20/22 0745   09/18/22 0800  ceFEPIme (MAXIPIME) 2 g in sodium chloride 0.9 % 100 mL IVPB  Status:  Discontinued        2 g 200 mL/hr over 30 Minutes Intravenous Every 8 hours 09/17/22 2335 09/18/22 0740   09/18/22 0800  ceFEPIme (MAXIPIME) 2 g in sodium chloride 0.9 % 100 mL IVPB  Status:  Discontinued        2 g 200 mL/hr over 30 Minutes Intravenous  Once 09/18/22 0740 09/18/22 0750   09/18/22 0800  ceFEPIme (MAXIPIME) 2 g in sodium chloride 0.9 % 100 mL IVPB  Status:  Discontinued        2 g 200 mL/hr over 30 Minutes Intravenous Every 8 hours 09/18/22 0750 09/19/22 0916   09/18/22 0745  vancomycin (VANCOCIN) IVPB 1000 mg/200 mL premix  Status:  Discontinued        1,000 mg 200 mL/hr over 60 Minutes Intravenous  Once 09/18/22 0740 09/18/22 0747   09/17/22 2345  vancomycin (VANCOCIN) IVPB 1000 mg/200 mL premix        1,000 mg 200 mL/hr over 60 Minutes Intravenous  Once 09/17/22 2335 09/18/22 0341   09/17/22 2300  ceFEPIme (MAXIPIME) 2 g in sodium chloride 0.9 % 100 mL IVPB        2 g 200 mL/hr over 30 Minutes Intravenous  Once 09/17/22 2255 09/17/22 2347   09/17/22 2300  metroNIDAZOLE (FLAGYL) IVPB 500 mg        500 mg 100 mL/hr over 60 Minutes Intravenous  Once 09/17/22 2255  09/18/22 0051   09/17/22 2300  vancomycin (VANCOCIN) IVPB 1000 mg/200 mL premix        1,000 mg 200 mL/hr over 60 Minutes Intravenous  Once 09/17/22 2255 09/18/22 0202        Medication:   acidophilus  2 capsule Oral TID   atorvastatin  10 mg Oral Daily   Chlorhexidine Gluconate Cloth  6 each Topical Daily   ciprofloxacin  500 mg Oral Q breakfast   cyanocobalamin  1,000 mcg Oral Daily   gabapentin  800 mg Oral BID   lactulose  20 g Oral Daily   midodrine  10 mg Oral TID WC   polyethylene glycol  17 g Oral Daily   sertraline  50 mg Oral Daily   sodium chloride flush  3 mL Intravenous Q12H  acetaminophen **OR** acetaminophen, albuterol, diphenhydrAMINE, hydrALAZINE, HYDROmorphone (DILAUDID) injection, ipratropium, levalbuterol, ondansetron **OR** ondansetron (ZOFRAN) IV, oxyCODONE, senna-docusate, sodium phosphate, traMADol, traZODone   Objective:   Vitals:   09/20/22 1322 09/20/22 1545 09/20/22 2202 09/21/22 0621  BP: (!) 135/55 (!) 114/46 (!) 117/51 (!) 127/58  Pulse: 80 67 75 71  Resp: '16 16 20 16  '$ Temp: 98.4 F (36.9 C) 98.1 F (36.7 C) 97.8 F (36.6 C) 98 F (36.7 C)  TempSrc:  Oral Oral Oral  SpO2:   95% 99%  Weight:    (!) 136.3 kg  Height:        Intake/Output Summary (Last 24 hours) at 09/21/2022 1039 Last data filed at 09/21/2022 0900 Gross per 24 hour  Intake 798.92 ml  Output 950 ml  Net -151.08 ml   Filed Weights   09/17/22 2120 09/20/22 0633 09/21/22 0621  Weight: (!) 140 kg (!) 136.4 kg (!) 136.3 kg     Physical examination:       General:  AAO x 3,  cooperative, no distress;   HEENT:  Normocephalic, PERRL, otherwise with in Normal limits   Neuro:  CNII-XII intact. , normal motor and sensation, reflexes intact   Lungs:   Clear to auscultation BL, Respirations unlabored,  No wheezes / crackles  Cardio:    S1/S2, RRR, No murmure, No Rubs or Gallops   Abdomen:  Soft, non-tender, bowel sounds active all four quadrants, no guarding or  peritoneal signs.  Muscular  skeletal:  Limited exam -global generalized weaknesses - in bed, able to move all 4 extremities,   2+ pulses,  symmetric, No pitting edema  Skin:  Dry, warm to touch, negative for any Rashes, Port a catheter placement right upper chest area-nontender no signs of erythema edema  Wounds: Please see nursing documentation      ---------------------------------------------------------------------------------------------------------------------------    LABs:     Latest Ref Rng & Units 09/21/2022    4:55 AM 09/20/2022    5:08 AM 09/19/2022    3:59 AM  CBC  WBC 4.0 - 10.5 K/uL 4.8  6.4  8.4   Hemoglobin 12.0 - 15.0 g/dL 8.4  6.9  7.2   Hematocrit 36.0 - 46.0 % 25.8  21.7  23.3   Platelets 150 - 400 K/uL 131  110  104       Latest Ref Rng & Units 09/21/2022    4:55 AM 09/20/2022    5:08 AM 09/19/2022    3:59 AM  CMP  Glucose 70 - 99 mg/dL 123  155  134   BUN 8 - 23 mg/dL 35  37  37   Creatinine 0.44 - 1.00 mg/dL 1.52  1.70  1.58   Sodium 135 - 145 mmol/L 134  136  136   Potassium 3.5 - 5.1 mmol/L 3.7  3.8  4.1   Chloride 98 - 111 mmol/L 107  109  106   CO2 22 - 32 mmol/L '20  21  20   '$ Calcium 8.9 - 10.3 mg/dL 7.6  7.5  7.8   Total Protein 6.5 - 8.1 g/dL 4.4  4.2  4.3   Total Bilirubin 0.3 - 1.2 mg/dL 1.7  1.8  1.8   Alkaline Phos 38 - 126 U/L 89  83  76   AST 15 - 41 U/L 41  44  48   ALT 0 - 44 U/L '22  21  20        '$ Micro Results Recent Results (from the past 240  hour(s))  Culture, blood (Routine x 2)     Status: Abnormal (Preliminary result)   Collection Time: 09/17/22  9:11 PM   Specimen: Porta Cath; Blood  Result Value Ref Range Status   Specimen Description   Final    PORTA CATH Performed at [facility], [address], [place], [place] [address]    Special Requests   Final    BOTTLES DRAWN AEROBIC AND ANAEROBIC Blood Culture adequate volume Performed at [facility], [address], [place], [place] [address]    Culture  Setup Time   Final     GRAM POSITIVE COCCI Gram Stain Report Called to,Read Back By and Verified With: J HUMPHRIES FU [id number] K FORSYTH AEROBIC BOTTLE ONLY ANAEROBIC BOTTLE ALSO ON 09/18/22 AT 1920 BY LOY,C CRITICAL RESULT CALLED TO, READ BACK BY AND VERIFIED WITH: RN JADA MUSE ON 09/18/22 @ 2106 BY DRT    Culture (A)  Final    STAPHYLOCOCCUS CAPITIS SUSCEPTIBILITIES PERFORMED ON PREVIOUS CULTURE WITHIN THE LAST 5 DAYS. Performed at [facility], [place] [address], [place], [place] [address]    Report Status PENDING  Incomplete  Blood Culture ID Panel (Reflexed)     Status: Abnormal   Collection Time: 09/17/22  9:11 PM  Result Value Ref Range Status   Enterococcus faecalis NOT DETECTED NOT DETECTED Final   Enterococcus Faecium NOT DETECTED NOT DETECTED Final   Listeria monocytogenes NOT DETECTED NOT DETECTED Final   Staphylococcus species DETECTED (A) NOT DETECTED Final    Comment: CRITICAL RESULT CALLED TO, READ BACK BY AND VERIFIED WITH: RN JADA MUSE ON 09/18/22 @ 2106 BY DRT    Staphylococcus aureus (BCID) NOT DETECTED NOT DETECTED Final   Staphylococcus epidermidis NOT DETECTED NOT DETECTED Final   Staphylococcus lugdunensis NOT DETECTED NOT DETECTED Final   Streptococcus species NOT DETECTED NOT DETECTED Final   Streptococcus agalactiae NOT DETECTED NOT DETECTED Final   Streptococcus pneumoniae NOT DETECTED NOT DETECTED Final   Streptococcus pyogenes NOT DETECTED NOT DETECTED Final   A.calcoaceticus-baumannii NOT DETECTED NOT DETECTED Final   Bacteroides fragilis NOT DETECTED NOT DETECTED Final   Enterobacterales NOT DETECTED NOT DETECTED Final   Enterobacter cloacae complex NOT DETECTED NOT DETECTED Final   Escherichia coli NOT DETECTED NOT DETECTED Final   Klebsiella aerogenes NOT DETECTED NOT DETECTED Final   Klebsiella oxytoca NOT DETECTED NOT DETECTED Final   Klebsiella pneumoniae NOT DETECTED NOT DETECTED Final   Proteus species NOT DETECTED NOT DETECTED Final   Salmonella species NOT  DETECTED NOT DETECTED Final   Serratia marcescens NOT DETECTED NOT DETECTED Final   Haemophilus influenzae NOT DETECTED NOT DETECTED Final   Neisseria meningitidis NOT DETECTED NOT DETECTED Final   Pseudomonas aeruginosa NOT DETECTED NOT DETECTED Final   Stenotrophomonas maltophilia NOT DETECTED NOT DETECTED Final   Candida albicans NOT DETECTED NOT DETECTED Final   Candida auris NOT DETECTED NOT DETECTED Final   Candida glabrata NOT DETECTED NOT DETECTED Final   Candida krusei NOT DETECTED NOT DETECTED Final   Candida parapsilosis NOT DETECTED NOT DETECTED Final   Candida tropicalis NOT DETECTED NOT DETECTED Final   Cryptococcus neoformans/gattii NOT DETECTED NOT DETECTED Final    Comment: Performed at [facility], [place]. [address] [place], [place] [address]  Urine Culture (for pregnant, neutropenic or urologic patients or patients with an indwelling urinary catheter)     Status: None   Collection Time: 09/17/22  9:20 PM   Specimen: Urine, Clean Catch  Result  Value Ref Range Status   Specimen Description   Final    URINE, CLEAN CATCH Performed at [facility], [address] [place], [place] [address]    Special Requests   Final    Normal Performed at [facility], [address] [place], [place] [address]    Culture   Final    NO GROWTH Performed at [facility], [place] [address], [place], [place] [address]    Report Status 09/19/2022 FINAL  Final  Culture, blood (Routine x 2)     Status: Abnormal (Preliminary result)   Collection Time: 09/17/22  9:40 PM   Specimen: Left Antecubital; Blood  Result Value Ref Range Status   Specimen Description   Final    LEFT ANTECUBITAL Performed at [facility], [address], [place],  [address]    Special Requests   Final    BOTTLES DRAWN AEROBIC AND ANAEROBIC Blood Culture adequate volume Performed at [facility], [address] [place],  [address]    Culture  Setup Time   Final    AEROBIC BOTTLE ONLY GRAM  POSITIVE COCCI CRITICAL VALUE NOTED.  VALUE IS CONSISTENT WITH PREVIOUSLY REPORTED AND CALLED VALUE. ANAEROBIC BOTTLE GRAM POSITIVE COCCI    Culture STAPHYLOCOCCUS CAPITIS (A)  Final   Report Status PENDING  Incomplete   Organism ID, Bacteria STAPHYLOCOCCUS CAPITIS  Final      Susceptibility   Staphylococcus capitis - MIC*    CIPROFLOXACIN <=0.5 SENSITIVE Sensitive     ERYTHROMYCIN <=0.25 SENSITIVE Sensitive     GENTAMICIN <=0.5 SENSITIVE Sensitive     OXACILLIN <=0.25 SENSITIVE Sensitive     TETRACYCLINE <=1 SENSITIVE Sensitive     VANCOMYCIN <=0.5 SENSITIVE Sensitive     TRIMETH/SULFA <=10 SENSITIVE Sensitive     CLINDAMYCIN <=0.25 SENSITIVE Sensitive     RIFAMPIN <=0.5 SENSITIVE Sensitive     Inducible Clindamycin NEGATIVE Sensitive     * STAPHYLOCOCCUS CAPITIS  Resp panel by RT-PCR (RSV, Flu A&B, Covid) Anterior Nasal Swab     Status: None   Collection Time: 09/17/22 10:53 PM   Specimen: Anterior Nasal Swab  Result Value Ref Range Status   SARS Coronavirus 2 by RT PCR NEGATIVE NEGATIVE Final    Comment: (NOTE) SARS-CoV-2 target nucleic acids are NOT DETECTED.  The SARS-CoV-2 RNA is generally detectable in upper respiratory specimens during the acute phase of infection. The lowest concentration of SARS-CoV-2 viral copies this assay can detect is 138 copies/mL. A negative result does not preclude SARS-Cov-2 infection and should not be used as the sole basis for treatment or other patient management decisions. A negative result may occur with  improper specimen collection/handling, submission of specimen other than nasopharyngeal swab, presence of viral mutation(s) within the areas targeted by this assay, and inadequate number of viral copies(<138 copies/mL). A negative result must be combined with clinical observations, patient history, and epidemiological information. The expected result is Negative.  Fact Sheet for Patients:   EntrepreneurPulse.com.au  Fact Sheet for Healthcare Providers:  IncredibleEmployment.be  This test is no t yet approved or cleared by the [place] FDA and  has been authorized for detection and/or diagnosis of SARS-CoV-2 by FDA under an Emergency Use Authorization (EUA). This EUA will remain  in effect (meaning this test can be used) for the duration of the COVID-19 declaration under Section 564(b)(1) of the Act, 21 U.S.C.section 360bbb-3(b)(1), unless the authorization is terminated  or revoked sooner.       Influenza A by  PCR NEGATIVE NEGATIVE Final   Influenza B by PCR NEGATIVE NEGATIVE Final    Comment: (NOTE) The Xpert Xpress SARS-CoV-2/FLU/RSV plus assay is intended as an aid in the diagnosis of influenza from Nasopharyngeal swab specimens and should not be used as a sole basis for treatment. Nasal washings and aspirates are unacceptable for Xpert Xpress SARS-CoV-2/FLU/RSV testing.  Fact Sheet for Patients: EntrepreneurPulse.com.au  Fact Sheet for Healthcare Providers: IncredibleEmployment.be  This test is not yet approved or cleared by the [place] FDA and has been authorized for detection and/or diagnosis of SARS-CoV-2 by FDA under an Emergency Use Authorization (EUA). This EUA will remain in effect (meaning this test can be used) for the duration of the COVID-19 declaration under Section 564(b)(1) of the Act, 21 U.S.C. section 360bbb-3(b)(1), unless the authorization is terminated or revoked.     Resp Syncytial Virus by PCR NEGATIVE NEGATIVE Final    Comment: (NOTE) Fact Sheet for Patients: EntrepreneurPulse.com.au  Fact Sheet for Healthcare Providers: IncredibleEmployment.be  This test is not yet approved or cleared by the [place] FDA and has been authorized for detection and/or diagnosis of SARS-CoV-2 by FDA under an Emergency Use  Authorization (EUA). This EUA will remain in effect (meaning this test can be used) for the duration of the COVID-19 declaration under Section 564(b)(1) of the Act, 21 U.S.C. section 360bbb-3(b)(1), unless the authorization is terminated or revoked.  Performed at [facility], [address] [place], [place] [address]   Culture, body fluid w Gram Stain-bottle     Status: None (Preliminary result)   Collection Time: 09/18/22  5:00 AM   Specimen: Peritoneal Washings  Result Value Ref Range Status   Specimen Description PERITONEAL  Final   Special Requests BOTTLES DRAWN AEROBIC AND ANAEROBIC 10CC  Final   Culture   Final    NO GROWTH 2 DAYS Performed at [facility], [address], [place], [place] [address]    Report Status PENDING  Incomplete  Gram stain     Status: None   Collection Time: 09/18/22  5:00 AM   Specimen: Peritoneal Washings  Result Value Ref Range Status   Specimen Description PERITONEAL  Final   Special Requests NONE  Final   Gram Stain   Final    CYTOSPIN SMEAR NO ORGANISMS SEEN WBC PRESENT,BOTH PMN AND MONONUCLEAR Performed at [facility], [address], [place], [place] [address]    Report Status 09/18/2022 FINAL  Final  Culture, blood (Routine X 2) w Reflex to ID Panel     Status: None (Preliminary result)   Collection Time: 09/19/22 12:38 AM   Specimen: BLOOD  Result Value Ref Range Status   Specimen Description BLOOD BLOOD RIGHT WRIST  Final   Special Requests   Final    BOTTLES DRAWN AEROBIC AND ANAEROBIC Blood Culture adequate volume   Culture   Final    NO GROWTH 1 DAY Performed at [facility], [address] [place], [place] [address]    Report Status PENDING  Incomplete  Culture, blood (Routine X 2) w Reflex to ID Panel     Status: None (Preliminary result)   Collection Time: 09/19/22 12:40 AM   Specimen: BLOOD  Result Value Ref Range Status   Specimen Description BLOOD BLOOD RIGHT HAND  Final   Special Requests   Final    BOTTLES DRAWN  AEROBIC AND ANAEROBIC Blood Culture adequate volume   Culture   Final    NO GROWTH 1 DAY Performed at [facility],  [address], [place], [place] [address]    Report Status PENDING  Incomplete    Radiology Reports No results found.  SIGNED: Deatra James, MD, FHM. FAAFP. Zacarias Pontes - [facility] Time spent > 55 min.  In seeing, evaluating and examining the patient. Reviewing medical records, labs, drawn plan of care. [facility],  Pager (please use [email or web address] to page/ text) Please use Epic Secure Chat for non-urgent communication (7AM-7PM)  If 7PM-7AM, please contact night-coverage [email or web address], 09/21/2022, 10:39 AM

## 2022-09-21 NOTE — Assessment & Plan Note (Signed)
Staph capitis bacteremia -Blood cultures are growing, Staph capitis-pansensitive -IV vancomycin and Po Cipro >> IV antibiotic will be switched to cefazolin as of 09/21/2022 -Planning for repeating cultures, obtaining TEE   -Dr. Juleen China and Dr. Graylon Good following -Possible source of infection port a cath  -in discussion of saving the port a catheter due to poor access -versus removal  -Patient has a very poor prophylaxis, and need of a portacatheter for multiple transfusion blood, albumin, antibiotics iron...     -Discussed with Dr. Juleen China: Recommendations: -Try to obtain PIV access established. if so, then can proceed with Port removal and line holiday x 48-72 hrs before placing new line  If unable to get reliable access, then approach of line retention and treating through infection may be the option here in which case would plan to retain line and treat for longer duration +/- antibiotic

## 2022-09-21 NOTE — Consult Note (Signed)
Patient seen, chart reviewed.  Have been asked to remove a Port-A-Cath due to MSSA bacteremia.  The port site was inspected and there is no erythema or abscess cavity present.  Patient has a tenuous peripheral IV that was just started.  Patient has multiple medications and the need for multiple blood transfusions and albumin.  I would be very hesitant on removing the port at the present time given her lack of IV access, need for multiple transfusions and medications, and the fact that her bacteremia is sensitive to multiple antibiotics.  I did discuss this with Dr. Ephraim Hamburger and will be available to remove it should the need arise.  I would try to sterilize this port by giving the antibiotics through the Port-A-Cath.

## 2022-09-22 ENCOUNTER — Inpatient Hospital Stay (HOSPITAL_COMMUNITY): Payer: 59

## 2022-09-22 ENCOUNTER — Inpatient Hospital Stay: Payer: Self-pay

## 2022-09-22 ENCOUNTER — Other Ambulatory Visit (HOSPITAL_COMMUNITY): Payer: Self-pay | Admitting: *Deleted

## 2022-09-22 DIAGNOSIS — R7881 Bacteremia: Secondary | ICD-10-CM | POA: Diagnosis not present

## 2022-09-22 LAB — TYPE AND SCREEN
ABO/RH(D): A POS
Antibody Screen: NEGATIVE
Unit division: 0
Unit division: 0
Unit division: 0
Unit division: 0

## 2022-09-22 LAB — GRAM STAIN: Gram Stain: NONE SEEN

## 2022-09-22 LAB — BPAM RBC
Blood Product Expiration Date: 202402272359
Blood Product Expiration Date: 202402282359
Blood Product Expiration Date: 202403032359
Blood Product Expiration Date: 202403042359
ISSUE DATE / TIME: 202402010348
ISSUE DATE / TIME: 202402010617
ISSUE DATE / TIME: 202402030932
ISSUE DATE / TIME: 202402031300
Unit Type and Rh: 6200
Unit Type and Rh: 6200
Unit Type and Rh: 6200
Unit Type and Rh: 6200

## 2022-09-22 LAB — GLUCOSE, CAPILLARY
Glucose-Capillary: 104 mg/dL — ABNORMAL HIGH (ref 70–99)
Glucose-Capillary: 157 mg/dL — ABNORMAL HIGH (ref 70–99)

## 2022-09-22 LAB — BODY FLUID CELL COUNT WITH DIFFERENTIAL
Eos, Fluid: 0 %
Lymphs, Fluid: 61 %
Monocyte-Macrophage-Serous Fluid: 25 % — ABNORMAL LOW (ref 50–90)
Neutrophil Count, Fluid: 14 % (ref 0–25)
Total Nucleated Cell Count, Fluid: 70 cu mm (ref 0–1000)

## 2022-09-22 LAB — CULTURE, BLOOD (ROUTINE X 2): Special Requests: ADEQUATE

## 2022-09-22 MED ORDER — ALBUMIN HUMAN 25 % IV SOLN
50.0000 g | INTRAVENOUS | Status: AC
  Administered 2022-09-22 (×2): 50 g via INTRAVENOUS
  Filled 2022-09-22: qty 200

## 2022-09-22 MED ORDER — CHLORHEXIDINE GLUCONATE CLOTH 2 % EX PADS
6.0000 | MEDICATED_PAD | Freq: Once | CUTANEOUS | Status: AC
  Administered 2022-09-22: 6 via TOPICAL

## 2022-09-22 MED ORDER — SODIUM CHLORIDE 0.9 % IV SOLN: INTRAVENOUS | Status: DC

## 2022-09-22 MED ORDER — ALBUMIN HUMAN 25 % IV SOLN
25.0000 g | INTRAVENOUS | Status: DC
  Filled 2022-09-22: qty 100

## 2022-09-22 NOTE — Progress Notes (Signed)
Patient is on the schedule tomorrow for Port-A-Cath removal.  The risks and benefits of the procedure were fully explained to the patient, who gave informed consent.

## 2022-09-22 NOTE — Procedures (Signed)
PreOperative Dx: Cirrhosis, ascites Postoperative Dx: Cirrhosis, ascites Procedure:   US guided paracentesis Radiologist:  Vernetta Dizdarevic Anesthesia:  10 ml of1% lidocaine Specimen:  7 L of yellow ascitic fluid EBL:   < 1 ml Complications: None  

## 2022-09-22 NOTE — Progress Notes (Signed)
PROGRESS NOTE    Patient: Rachel Huber                            PCP: [name], MD                    DOB: 26-Jun-1956            DOA: 09/17/2022 TFT:[id number]             DOS: 09/22/2022, 11:03 AM   LOS: 4 days   Date of Service: The patient was seen and examined on 09/22/2022  Subjective:   The patient was seen and examined this morning, stable no acute distress Hemodynamically stable, afebrile, normotensive  Patient was informed of positive blood cultures of staph and the need for further workup including port a catheter removal, TEE, prolonged antibiotics  Patient is also due for her routine paracentesis which was scheduled for today  In discussion with ID and surgery regarding her port catheter removal versus saving the site and sterilizing  Brief Narrative:   MILA PAIR is a 67 year old female with significant history of beta thalassemia minor (status post first treatment with IV Retacrit,on 1/31), aslo H/o cirrhosis NASH,with paracentesis 1/30, CKD 3B, HLD, GERD, internal hemorrhoids, OSA-CPAP, anxiety/depression, chronic anemia -with multiple PRBC transfusion, chronic hemorrhoid with bleeds, insomnia, ....   On 09/17/2022 patient received IV Retacrit through her port a cath in oncology office for her beta thalassemia, also has received iron infusion secondary to chronic anemia, also had paracentesis 09/16/2022.  Proximately 45 minutes after treatment with Retacrit follow-up fever 104.6, was tachycardic with a pulse of 102, blood pressure 104/60 with RR of 17, O2 sat of 95%  Of note patient has chronic ascites, [name] liver cirrhosis requiring intermittent paracentesis, recent EGD revealed portal hypertension, patient had TIPS procedure on Dec 26, 2021 revision in July 2026 2023.  She was subsequently asked to admit for SARS criteria, to rule out sepsis.     Assessment & Plan:   Principal Problem:   Bacteremia Active Problems:   Acute on chronic blood loss  anemia   SIRS (systemic inflammatory response syndrome) (HCC)   Acute kidney injury superimposed on CKD ([place])   Depression with anxiety   HLD (hyperlipidemia)   Thrombocytopenia (HCC)   Chronic kidney disease, stage 3b (HCC)   Thalassemia minor   Type 2 diabetes mellitus with hyperlipidemia (HCC)   OSA on CPAP   Obesity (BMI 30-39.9)   Sepsis ([place])   Alcoholic cirrhosis of liver with ascites (HCC)     Assessment and Plan: * Bacteremia Staph capitis bacteremia -Blood cultures are growing, Staph capitis-pansensitive -IV vancomycin and Po Cipro >> IV antibiotic will be switched to cefazolin as of 09/21/2022 -Planning for repeating cultures, obtaining TEE   -Dr. Juleen China and Dr. Graylon Good following -Possible source of infection port a cath  -in discussion of saving the port a catheter due to poor access -versus removal  -Patient has a very poor prophylaxis, and need of a portacatheter for multiple transfusion blood, albumin, antibiotics iron...     -Discussed with Dr. Juleen China: Recommendations: -Try to obtain PIV access established. if so, then can proceed with Port removal and line holiday x 48-72 hrs before placing new line  If unable to get reliable access, then approach of line retention and treating through infection may be the option here in which case would plan to retain line and treat for longer  duration +/- antibiotic   SIRS (systemic inflammatory response syndrome) (HCC) - Resolved -HUT:MLYY criteria versus adverse reaction to IV medication of Retacrit,   Ruled out sepsis-initiate sepsis protocol -IV fluid resuscitation and broad-spectrum antibiotic, cultures: Growing staph capitis ID consulted, continue vancomycin, p.o. Cipro In discussion for removal of Port-A-Cath versus sterilizing and saving it  Acute on chronic blood loss anemia -Likely due to thalassemia minor -Status post ?1/2U PRBC transfusion 09/18/2022 -09/20/2022 agreed to 2U PRBC transfusion  -Hemoglobin 7.1,  6.7  >>> 7.2 >> 6.9 >> 2 U PRBC >>> 8.4  --History of frequent blood transfusion,  -Baseline hemoglobin: 5.8-8.7    Fever Met SIRS criteria on admission, sepsis ruled out  -POA: Tmax 104.6,  -Met SIRS criteria,--- this protocol -Ruling out sepsis reaction to IV medication Retacrit,   Acute kidney injury superimposed on CKD ([place]) -Monitoring -Avoiding nephrotoxins  Lab Results  Component Value Date   CREATININE 1.52 (H) 09/21/2022   CREATININE 1.70 (H) 09/20/2022   CREATININE 1.58 (H) 09/19/2022   -Worsening kidney function, continue with gentle IV fluid hydration  Depression with anxiety -Stable continue home medication of sertraline, Neurontin -As needed Xanax  Thrombocytopenia (HCC) - Platelets 116, 123, 131 -  no signs of bleeding, monitoring closely - For platelets less than 50 will transfuse platelets -Monitoring  HLD (hyperlipidemia) -Sinew statins  Chronic kidney disease, stage 3b ([place]) Monitoring BUN/creatinine closely -Nephrotoxins -Status post gentle IV fluid hydration, Lab Results  Component Value Date   CREATININE 1.52 (H) 09/21/2022   CREATININE 1.70 (H) 09/20/2022   CREATININE 1.58 (H) 09/19/2022     Thalassemia minor -Close follow-up with oncologist -Status post first treatment with IV Retacrit, on 09/17/2022 -Post infusion reaction with fever -Discussed the case with Dr. 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 -hemoglobin 6.7, platelets 123 -No signs of bleeding monitoring closely, transfusing 2 unit PRBC -Hemoglobin 6.9, transfusing another 2U PRBC 09/20/2022     Latest Ref Rng & Units 09/21/2022    4:55 AM 09/20/2022    5:08 AM 09/19/2022    3:59 AM  CBC  WBC 4.0 - 10.5 K/uL 4.8  6.4  8.4   Hemoglobin 12.0 - 15.0 g/dL 8.4  6.9  7.2   Hematocrit 36.0 - 46.0 % 25.8  21.7  23.3   Platelets 150 - 400 K/uL 131  110  503      Alcoholic cirrhosis of liver with  ascites ([place]) -Plan for routine thoracentesis 09/22/22   Sepsis ([place]) - Sepsis ruled out -Met SIRS criteria on admission   Bacteremic  w staph capitis  -Repeating blood cultures today 09/19/2022 >>> no growth to date -Continue vancomycin and p.o. ciprofloxacin  -Discontinue Flagyl -Consulting ID, Dr. Willette Pa. Baxter Flattery following closely  Evaluating portacatheter as source of infection -in discussion and saving the line Possible TEE -to rule out endocarditis   Obesity (BMI 30-39.9) Body mass index is 46.93 kg/m. -Advised close follow-up with PCP regarding weight loss programs -Clear diet and exercise recommended  OSA on CPAP -Continue supplemental oxygen, CPAP nightly  Type 2 diabetes mellitus with hyperlipidemia (HCC) -This morning glucose 168, -Patient is not on any diabetic medication -Will monitor blood sugars closely -Last hemoglobin A1C 5.5 months ago  -Continue statins  ---------------------------------------------------------------------------------------------------------------------------- Nutritional status:  The patient's BMI is: Body mass index is 48.04 kg/m. I agree with the assessment and plan as outlined ----------------------------------------------------------------------------------------------------------------------------------------- Cultures; Blood Cultures >>> GPC-Staph capitis-pansensitive 2/2/20204 Repeat blood cultures -------------------------------------------------------------------------------------------------------------------------------------  DVT prophylaxis:  Place and maintain sequential compression device Start: 09/18/22 0826 TED hose Start: 09/18/22 0735 SCDs Start: 09/18/22 0403   Code Status:   Code Status: Full Code  Family Communication: No family member present at bedside- attempt will be made to update daily The above findings and plan of care has been discussed with patient (and family)  in detail,  they expressed  understanding and agreement of above. -Advance care planning has been discussed.   Admission status:   Status is: Inpatient Remains inpatient appropriate because: Needing IV antibiotics, blood transfusion, ruling out bacteremia   Disposition: From  - home             Planning for discharge in 2-3 days: to Home    Procedures:   No admission procedures for hospital encounter.   Antimicrobials:  Anti-infectives (From admission, onward)    Start     Dose/Rate Route Frequency Ordered Stop   09/21/22 1400  ceFAZolin (ANCEF) IVPB 2g/100 mL premix        2 g 200 mL/hr over 30 Minutes Intravenous Every 8 hours 09/21/22 1029     09/21/22 0800  ciprofloxacin (CIPRO) tablet 500 mg        500 mg Oral Daily with breakfast 09/20/22 1629     09/19/22 2030  ceFEPIme (MAXIPIME) 2 g in sodium chloride 0.9 % 100 mL IVPB  Status:  Discontinued        2 g 200 mL/hr over 30 Minutes Intravenous Every 12 hours 09/19/22 0916 09/20/22 1629   09/19/22 0000  vancomycin (VANCOREADY) IVPB 1250 mg/250 mL  Status:  Discontinued        1,250 mg 166.7 mL/hr over 90 Minutes Intravenous Every 24 hours 09/17/22 2335 09/18/22 0740   09/19/22 0000  vancomycin (VANCOREADY) IVPB 1750 mg/350 mL  Status:  Discontinued        1,750 mg 175 mL/hr over 120 Minutes Intravenous Every 24 hours 09/18/22 0755 09/21/22 1029   09/18/22 1200  metroNIDAZOLE (FLAGYL) IVPB 500 mg  Status:  Discontinued        500 mg 100 mL/hr over 60 Minutes Intravenous Every 12 hours 09/18/22 0740 09/20/22 0745   09/18/22 0800  ceFEPIme (MAXIPIME) 2 g in sodium chloride 0.9 % 100 mL IVPB  Status:  Discontinued        2 g 200 mL/hr over 30 Minutes Intravenous Every 8 hours 09/17/22 2335 09/18/22 0740   09/18/22 0800  ceFEPIme (MAXIPIME) 2 g in sodium chloride 0.9 % 100 mL IVPB  Status:  Discontinued        2 g 200 mL/hr over 30 Minutes Intravenous  Once 09/18/22 0740 09/18/22 0750   09/18/22 0800  ceFEPIme (MAXIPIME) 2 g in sodium chloride 0.9 %  100 mL IVPB  Status:  Discontinued        2 g 200 mL/hr over 30 Minutes Intravenous Every 8 hours 09/18/22 0750 09/19/22 0916   09/18/22 0745  vancomycin (VANCOCIN) IVPB 1000 mg/200 mL premix  Status:  Discontinued        1,000 mg 200 mL/hr over 60 Minutes Intravenous  Once 09/18/22 0740 09/18/22 0747   09/17/22 2345  vancomycin (VANCOCIN) IVPB 1000 mg/200 mL premix        1,000 mg 200 mL/hr over 60 Minutes Intravenous  Once 09/17/22 2335 09/18/22 0341   09/17/22 2300  ceFEPIme (MAXIPIME) 2 g in sodium chloride 0.9 % 100 mL IVPB        2 g 200 mL/hr over 30 Minutes  Intravenous  Once 09/17/22 2255 09/17/22 2347   09/17/22 2300  metroNIDAZOLE (FLAGYL) IVPB 500 mg        500 mg 100 mL/hr over 60 Minutes Intravenous  Once 09/17/22 2255 09/18/22 0051   09/17/22 2300  vancomycin (VANCOCIN) IVPB 1000 mg/200 mL premix        1,000 mg 200 mL/hr over 60 Minutes Intravenous  Once 09/17/22 2255 09/18/22 0202        Medication:   acidophilus  2 capsule Oral TID   atorvastatin  10 mg Oral Daily   Chlorhexidine Gluconate Cloth  6 each Topical Daily   ciprofloxacin  500 mg Oral Q breakfast   cyanocobalamin  1,000 mcg Oral Daily   gabapentin  800 mg Oral BID   lactulose  20 g Oral Daily   midodrine  10 mg Oral TID WC   polyethylene glycol  17 g Oral Daily   sertraline  50 mg Oral Daily   sodium chloride flush  3 mL Intravenous Q12H    acetaminophen **OR** acetaminophen, albuterol, diphenhydrAMINE, hydrALAZINE, HYDROmorphone (DILAUDID) injection, ipratropium, levalbuterol, ondansetron **OR** ondansetron (ZOFRAN) IV, oxyCODONE, senna-docusate, sodium phosphate, traMADol, traZODone   Objective:   Vitals:   09/21/22 2240 09/22/22 0500 09/22/22 0800 09/22/22 1023  BP: (!) 122/50  (!) 120/56 (!) 121/51  Pulse: 67  67 69  Resp: 20   20  Temp: 98.4 F (36.9 C)  97.7 F (36.5 C) 97.6 F (36.4 C)  TempSrc: Oral  Oral Oral  SpO2: 99%  98% 99%  Weight:  (!) 143.3 kg    Height:         Intake/Output Summary (Last 24 hours) at 09/22/2022 1103 Last data filed at 09/21/2022 2300 Gross per 24 hour  Intake 340 ml  Output 500 ml  Net -160 ml   Filed Weights   09/20/22 0633 09/21/22 0621 09/22/22 0500  Weight: (!) 136.4 kg (!) 136.3 kg (!) 143.3 kg     Physical examination:     General:  AAO x 3,  cooperative, no distress;   HEENT:  Normocephalic, PERRL, otherwise with in Normal limits   Neuro:  CNII-XII intact. , normal motor and sensation, reflexes intact   Lungs:   Clear to auscultation BL, Respirations unlabored,  No wheezes / crackles  Cardio:    S1/S2, RRR, No murmure, No Rubs or Gallops   Abdomen:  Soft, non-tender, bowel sounds active all four quadrants, no guarding or peritoneal signs. Mild distention, positive for fluid shift  Muscular  skeletal:  Limited exam -global generalized weaknesses - in bed, able to move all 4 extremities,   2+ pulses,  symmetric, No pitting edema  Skin:  Dry, warm to touch, negative for any Rashes,  Wounds: Please see nursing documentation           ---------------------------------------------------------------------------------------------------------------------------    LABs:     Latest Ref Rng & Units 09/21/2022    4:55 AM 09/20/2022    5:08 AM 09/19/2022    3:59 AM  CBC  WBC 4.0 - 10.5 K/uL 4.8  6.4  8.4   Hemoglobin 12.0 - 15.0 g/dL 8.4  6.9  7.2   Hematocrit 36.0 - 46.0 % 25.8  21.7  23.3   Platelets 150 - 400 K/uL 131  110  104       Latest Ref Rng & Units 09/21/2022    4:55 AM 09/20/2022    5:08 AM 09/19/2022    3:59 AM  CMP  Glucose 70 -  99 mg/dL 123  155  134   BUN 8 - 23 mg/dL 35  37  37   Creatinine 0.44 - 1.00 mg/dL 1.52  1.70  1.58   Sodium 135 - 145 mmol/L 134  136  136   Potassium 3.5 - 5.1 mmol/L 3.7  3.8  4.1   Chloride 98 - 111 mmol/L 107  109  106   CO2 22 - 32 mmol/L '20  21  20   '$ Calcium 8.9 - 10.3 mg/dL 7.6  7.5  7.8   Total Protein 6.5 - 8.1 g/dL 4.4  4.2  4.3   Total Bilirubin 0.3 -  1.2 mg/dL 1.7  1.8  1.8   Alkaline Phos 38 - 126 U/L 89  83  76   AST 15 - 41 U/L 41  44  48   ALT 0 - 44 U/L '22  21  20        '$ Micro Results Recent Results (from the past 240 hour(s))  Culture, blood (Routine x 2)     Status: Abnormal (Preliminary result)   Collection Time: 09/17/22  9:11 PM   Specimen: Porta Cath; Blood  Result Value Ref Range Status   Specimen Description   Final    PORTA CATH Performed at [facility], [address] [place], [place] [address]    Special Requests   Final    BOTTLES DRAWN AEROBIC AND ANAEROBIC Blood Culture adequate volume Performed at [facility], [address], [place], [place] [address]    Culture  Setup Time   Final    GRAM POSITIVE COCCI Gram Stain Report Called to,Read Back By and Verified With: J HUMPHRIES [place] [id number] K FORSYTH AEROBIC BOTTLE ONLY ANAEROBIC BOTTLE ALSO ON 09/18/22 AT 1920 BY LOY,C CRITICAL RESULT CALLED TO, READ BACK BY AND VERIFIED WITH: RN JADA MUSE ON 09/18/22 @ 2106 BY DRT    Culture (A)  Final    STAPHYLOCOCCUS CAPITIS SUSCEPTIBILITIES PERFORMED ON PREVIOUS CULTURE WITHIN THE LAST 5 DAYS. Performed at [facility], [place] [address], [place], [place] [address]    Report Status PENDING  Incomplete  Blood Culture ID Panel (Reflexed)     Status: Abnormal   Collection Time: 09/17/22  9:11 PM  Result Value Ref Range Status   Enterococcus faecalis NOT DETECTED NOT DETECTED Final   Enterococcus Faecium NOT DETECTED NOT DETECTED Final   Listeria monocytogenes NOT DETECTED NOT DETECTED Final   Staphylococcus species DETECTED (A) NOT DETECTED Final    Comment: CRITICAL RESULT CALLED TO, READ BACK BY AND VERIFIED WITH: RN JADA MUSE ON 09/18/22 @ 2106 BY DRT    Staphylococcus aureus (BCID) NOT DETECTED NOT DETECTED Final   Staphylococcus epidermidis NOT DETECTED NOT DETECTED Final   Staphylococcus lugdunensis NOT DETECTED NOT DETECTED Final   Streptococcus species NOT DETECTED NOT DETECTED Final   Streptococcus  agalactiae NOT DETECTED NOT DETECTED Final   Streptococcus pneumoniae NOT DETECTED NOT DETECTED Final   Streptococcus pyogenes NOT DETECTED NOT DETECTED Final   A.calcoaceticus-baumannii NOT DETECTED NOT DETECTED Final   Bacteroides fragilis NOT DETECTED NOT DETECTED Final   Enterobacterales NOT DETECTED NOT DETECTED Final   Enterobacter cloacae complex NOT DETECTED NOT DETECTED Final   Escherichia coli NOT DETECTED NOT DETECTED Final   Klebsiella aerogenes NOT DETECTED NOT DETECTED Final   Klebsiella oxytoca NOT DETECTED NOT DETECTED Final   Klebsiella pneumoniae NOT DETECTED NOT DETECTED Final   Proteus species NOT DETECTED NOT DETECTED Final   Salmonella  species NOT DETECTED NOT DETECTED Final   Serratia marcescens NOT DETECTED NOT DETECTED Final   Haemophilus influenzae NOT DETECTED NOT DETECTED Final   Neisseria meningitidis NOT DETECTED NOT DETECTED Final   Pseudomonas aeruginosa NOT DETECTED NOT DETECTED Final   Stenotrophomonas maltophilia NOT DETECTED NOT DETECTED Final   Candida albicans NOT DETECTED NOT DETECTED Final   Candida auris NOT DETECTED NOT DETECTED Final   Candida glabrata NOT DETECTED NOT DETECTED Final   Candida krusei NOT DETECTED NOT DETECTED Final   Candida parapsilosis NOT DETECTED NOT DETECTED Final   Candida tropicalis NOT DETECTED NOT DETECTED Final   Cryptococcus neoformans/gattii NOT DETECTED NOT DETECTED Final    Comment: Performed at [facility], [place] [address], [place], [place] [address]  Urine Culture (for pregnant, neutropenic or urologic patients or patients with an indwelling urinary catheter)     Status: None   Collection Time: 09/17/22  9:20 PM   Specimen: Urine, Clean Catch  Result Value Ref Range Status   Specimen Description   Final    URINE, CLEAN CATCH Performed at [facility], [address] [place], [place] [address]    Special Requests   Final    Normal Performed at [facility], [address] [place], [place] [address]     Culture   Final    NO GROWTH Performed at [facility], [place] [address] [place], [place] [address]    Report Status 09/19/2022 FINAL  Final  Culture, blood (Routine x 2)     Status: Abnormal   Collection Time: 09/17/22  9:40 PM   Specimen: Left Antecubital; Blood  Result Value Ref Range Status   Specimen Description   Final    LEFT ANTECUBITAL Performed at [facility], [address] [place], [place] [address]    Special Requests   Final    BOTTLES DRAWN AEROBIC AND ANAEROBIC Blood Culture adequate volume Performed at [facility], [address], [place], [place] [address]    Culture  Setup Time   Final    AEROBIC BOTTLE ONLY GRAM POSITIVE COCCI CRITICAL VALUE NOTED.  VALUE IS CONSISTENT WITH PREVIOUSLY REPORTED AND CALLED VALUE. ANAEROBIC BOTTLE GRAM POSITIVE COCCI    Culture STAPHYLOCOCCUS CAPITIS (A)  Final   Report Status 09/21/2022 FINAL  Final   Organism ID, Bacteria STAPHYLOCOCCUS CAPITIS  Final      Susceptibility   Staphylococcus capitis - MIC*    CIPROFLOXACIN <=0.5 SENSITIVE Sensitive     ERYTHROMYCIN <=0.25 SENSITIVE Sensitive     GENTAMICIN <=0.5 SENSITIVE Sensitive     OXACILLIN <=0.25 SENSITIVE Sensitive     TETRACYCLINE <=1 SENSITIVE Sensitive     VANCOMYCIN <=0.5 SENSITIVE Sensitive     TRIMETH/SULFA <=10 SENSITIVE Sensitive     CLINDAMYCIN <=0.25 SENSITIVE Sensitive     RIFAMPIN <=0.5 SENSITIVE Sensitive     Inducible Clindamycin NEGATIVE Sensitive     * STAPHYLOCOCCUS CAPITIS  Resp panel by RT-PCR (RSV, Flu A&B, Covid) Anterior Nasal Swab     Status: None   Collection Time: 09/17/22 10:53 PM   Specimen: Anterior Nasal Swab  Result Value Ref Range Status   SARS Coronavirus 2 by RT PCR NEGATIVE NEGATIVE Final    Comment: (NOTE) SARS-CoV-2 target nucleic acids are NOT DETECTED.  The SARS-CoV-2 RNA is generally detectable in upper respiratory specimens during the acute phase of infection. The lowest concentration of SARS-CoV-2 viral copies this  assay can detect is 138 copies/mL. A negative result does not preclude  SARS-Cov-2 infection and should not be used as the sole basis for treatment or other patient management decisions. A negative result may occur with  improper specimen collection/handling, submission of specimen other than nasopharyngeal swab, presence of viral mutation(s) within the areas targeted by this assay, and inadequate number of viral copies(<138 copies/mL). A negative result must be combined with clinical observations, patient history, and epidemiological information. The expected result is Negative.  Fact Sheet for Patients:  EntrepreneurPulse.com.au  Fact Sheet for Healthcare Providers:  IncredibleEmployment.be  This test is no t yet approved or cleared by the [place] FDA and  has been authorized for detection and/or diagnosis of SARS-CoV-2 by FDA under an Emergency Use Authorization (EUA). This EUA will remain  in effect (meaning this test can be used) for the duration of the COVID-19 declaration under Section 564(b)(1) of the Act, 21 U.S.C.section 360bbb-3(b)(1), unless the authorization is terminated  or revoked sooner.       Influenza A by PCR NEGATIVE NEGATIVE Final   Influenza B by PCR NEGATIVE NEGATIVE Final    Comment: (NOTE) The Xpert Xpress SARS-CoV-2/FLU/RSV plus assay is intended as an aid in the diagnosis of influenza from Nasopharyngeal swab specimens and should not be used as a sole basis for treatment. Nasal washings and aspirates are unacceptable for Xpert Xpress SARS-CoV-2/FLU/RSV testing.  Fact Sheet for Patients: EntrepreneurPulse.com.au  Fact Sheet for Healthcare Providers: IncredibleEmployment.be  This test is not yet approved or cleared by the [place] FDA and has been authorized for detection and/or diagnosis of SARS-CoV-2 by FDA under an Emergency Use Authorization (EUA). This EUA will  remain in effect (meaning this test can be used) for the duration of the COVID-19 declaration under Section 564(b)(1) of the Act, 21 U.S.C. section 360bbb-3(b)(1), unless the authorization is terminated or revoked.     Resp Syncytial Virus by PCR NEGATIVE NEGATIVE Final    Comment: (NOTE) Fact Sheet for Patients: EntrepreneurPulse.com.au  Fact Sheet for Healthcare Providers: IncredibleEmployment.be  This test is not yet approved or cleared by the [place] FDA and has been authorized for detection and/or diagnosis of SARS-CoV-2 by FDA under an Emergency Use Authorization (EUA). This EUA will remain in effect (meaning this test can be used) for the duration of the COVID-19 declaration under Section 564(b)(1) of the Act, 21 U.S.C. section 360bbb-3(b)(1), unless the authorization is terminated or revoked.  Performed at [facility], [address] [place], [place] [address]   Culture, body fluid w Gram Stain-bottle     Status: None (Preliminary result)   Collection Time: 09/18/22  5:00 AM   Specimen: Peritoneal Washings  Result Value Ref Range Status   Specimen Description PERITONEAL  Final   Special Requests BOTTLES DRAWN AEROBIC AND ANAEROBIC 10CC  Final   Culture   Final    NO GROWTH 4 DAYS Performed at [facility], [address], [place], [place] [address]    Report Status PENDING  Incomplete  Gram stain     Status: None   Collection Time: 09/18/22  5:00 AM   Specimen: Peritoneal Washings  Result Value Ref Range Status   Specimen Description PERITONEAL  Final   Special Requests NONE  Final   Gram Stain   Final    CYTOSPIN SMEAR NO ORGANISMS SEEN WBC PRESENT,BOTH PMN AND MONONUCLEAR Performed at [facility], [address], [place], [place] [address]    Report Status 09/18/2022 FINAL  Final  Culture, blood (Routine X 2) w Reflex to ID Panel  Status: None (Preliminary result)   Collection Time: 09/19/22 12:38 AM   Specimen:  BLOOD  Result Value Ref Range Status   Specimen Description BLOOD BLOOD RIGHT WRIST  Final   Special Requests   Final    BOTTLES DRAWN AEROBIC AND ANAEROBIC Blood Culture adequate volume   Culture   Final    NO GROWTH 3 DAYS Performed at [facility], [address] [place], [place] [address]    Report Status PENDING  Incomplete  Culture, blood (Routine X 2) w Reflex to ID Panel     Status: None (Preliminary result)   Collection Time: 09/19/22 12:40 AM   Specimen: BLOOD  Result Value Ref Range Status   Specimen Description BLOOD BLOOD RIGHT HAND  Final   Special Requests   Final    BOTTLES DRAWN AEROBIC AND ANAEROBIC Blood Culture adequate volume   Culture   Final    NO GROWTH 3 DAYS Performed at [facility], [address], [place], [place] [address]    Report Status PENDING  Incomplete  Culture, blood (single) w Reflex to ID Panel     Status: None (Preliminary result)   Collection Time: 09/22/22  8:49 AM   Specimen: Right Antecubital; Blood  Result Value Ref Range Status   Specimen Description RIGHT ANTECUBITAL  Final   Special Requests   Final    BOTTLES DRAWN AEROBIC AND ANAEROBIC Blood Culture results may not be optimal due to an excessive volume of blood received in culture bottles Performed at [facility], [address], [place], [place] [address]    Culture PENDING  Incomplete   Report Status PENDING  Incomplete    Radiology Reports ECHOCARDIOGRAM COMPLETE  Result Date: 09/21/2022    ECHOCARDIOGRAM REPORT   Patient Name:   Atira A Presley Date of Exam: 09/21/2022 Medical Rec #:  [id number]      Height:       68.0 in Accession #:    [id number]     Weight:       300.4 lb Date of Birth:  11-07-1955       BSA:          2.429 m Patient Age:    67 years       BP:           122/59 mmHg Patient Gender: F              HR:           68 bpm. Exam Location:  [facility] Procedure: 2D Echo, Cardiac Doppler and Color Doppler Indications:    Bacteremia  History:        Patient has prior  history of Echocardiogram examinations, most                 recent 10/26/2020. Signs/Symptoms:Dyspnea; Risk Factors:Sleep                 Apnea, Hypertension and Diabetes. Thalassemia minor, anemia in                 CKD, cirrhosis NASH.  Sonographer:    Eartha Inch Referring Phys: [id number] Mignon Pine  Sonographer Comments: Image acquisition challenging due to patient body habitus and Image acquisition challenging due to respiratory motion. IMPRESSIONS  1. Left ventricular ejection fraction, by estimation, is 65 to 70%. Left ventricular ejection fraction by 2D MOD biplane is 66.9 %. The left ventricle has normal function. The left ventricle has no regional wall motion abnormalities. The  left ventricular internal cavity size was mildly dilated. Left ventricular diastolic parameters were normal.  2. Right ventricular systolic function is normal. The right ventricular size is normal. Tricuspid regurgitation signal is inadequate for assessing PA pressure.  3. Left atrial size was moderately dilated.  4. The mitral valve is abnormal. Mild mitral valve regurgitation.  5. The aortic valve is tricuspid. Aortic valve regurgitation is not visualized.  6. The inferior vena cava is dilated in size with <50% respiratory variability, suggesting right atrial pressure of 15 mmHg. Comparison(s): Changes from prior study are noted. 10/26/2020: LVEF 55-60%. Conclusion(s)/Recommendation(s): No evidence of valvular vegetations on this transthoracic echocardiogram. Consider a transesophageal echocardiogram to exclude infective endocarditis if clinically indicated. FINDINGS  Left Ventricle: Left ventricular ejection fraction, by estimation, is 65 to 70%. Left ventricular ejection fraction by 2D MOD biplane is 66.9 %. The left ventricle has normal function. The left ventricle has no regional wall motion abnormalities. The left ventricular internal cavity size was mildly dilated. There is no left ventricular hypertrophy. Left  ventricular diastolic parameters were normal. Right Ventricle: The right ventricular size is normal. No increase in right ventricular wall thickness. Right ventricular systolic function is normal. Tricuspid regurgitation signal is inadequate for assessing PA pressure. Left Atrium: Left atrial size was moderately dilated. Right Atrium: Right atrial size was normal in size. Pericardium: There is no evidence of pericardial effusion. Mitral Valve: The mitral valve is abnormal. Mild to moderate mitral annular calcification. Mild mitral valve regurgitation. Tricuspid Valve: The tricuspid valve is not well visualized. Tricuspid valve regurgitation is not demonstrated. Aortic Valve: The aortic valve is tricuspid. Aortic valve regurgitation is not visualized. Aortic valve mean gradient measures 8.0 mmHg. Aortic valve peak gradient measures 18.0 mmHg. Aortic valve area, by VTI measures 1.96 cm. Pulmonic Valve: The pulmonic valve was grossly normal. Pulmonic valve regurgitation is trivial. Aorta: The aortic root and ascending aorta are structurally normal, with no evidence of dilitation. Venous: The inferior vena cava is dilated in size with less than 50% respiratory variability, suggesting right atrial pressure of 15 mmHg. IAS/Shunts: No atrial level shunt detected by color flow Doppler.  LEFT VENTRICLE PLAX 2D                        Biplane EF (MOD) LVIDd:         6.00 cm         LV Biplane EF:   Left LVIDs:         3.70 cm                          ventricular LV PW:         1.00 cm                          ejection LV IVS:        0.80 cm                          fraction by LVOT diam:     2.00 cm                          2D MOD LV SV:         90  biplane is LV SV Index:   37                               66.9 %. LVOT Area:     3.14 cm                                Diastology                                LV e' medial:    6.53 cm/s LV Volumes (MOD)               LV E/e' medial:  17.2 LV vol  d, MOD    110.0 ml      LV e' lateral:   10.80 cm/s A2C:                           LV E/e' lateral: 10.4 LV vol d, MOD    164.0 ml A4C: LV vol s, MOD    36.5 ml A2C: LV vol s, MOD    52.9 ml A4C: LV SV MOD A2C:   73.5 ml LV SV MOD A4C:   164.0 ml LV SV MOD BP:    89.5 ml RIGHT VENTRICLE             IVC RV S prime:     14.70 cm/s  IVC diam: 2.30 cm TAPSE (M-mode): 2.7 cm LEFT ATRIUM              Index        RIGHT ATRIUM           Index LA diam:        5.50 cm  2.26 cm/m   RA Area:     21.20 cm LA Vol (A2C):   114.0 ml 46.92 ml/m  RA Volume:   59.40 ml  24.45 ml/m LA Vol (A4C):   104.0 ml 42.81 ml/m LA Biplane Vol: 112.0 ml 46.10 ml/m  AORTIC VALVE AV Area (Vmax):    1.81 cm AV Area (Vmean):   1.84 cm AV Area (VTI):     1.96 cm AV Vmax:           212.00 cm/s AV Vmean:          138.000 cm/s AV VTI:            0.461 m AV Peak Grad:      18.0 mmHg AV Mean Grad:      8.0 mmHg LVOT Vmax:         122.00 cm/s LVOT Vmean:        80.800 cm/s LVOT VTI:          0.288 m LVOT/AV VTI ratio: 0.62  AORTA Ao Root diam: 3.30 cm Ao Asc diam:  3.20 cm MITRAL VALVE MV Area (PHT): 2.32 cm     SHUNTS MV Decel Time: 327 msec     Systemic VTI:  0.29 m MR Peak grad: 120.6 mmHg    Systemic Diam: 2.00 cm MR Mean grad: 82.0 mmHg MR Vmax:      549.00 cm/s MR Vmean:     420.0 cm/s MV E velocity: 112.00 cm/s MV A velocity: 48.00 cm/s MV E/A ratio:  2.33 Lyman Bishop MD Electronically signed by  Lyman Bishop MD Signature Date/Time: 09/21/2022/5:02:02 PM    Final     SIGNED: Deatra James, MD, FHM. FAAFP. [facility] Time spent > 55 min.  In seeing, evaluating and examining the patient. Reviewing medical records, labs, drawn plan of care. [facility],  Pager (please use [email or web address] to page/ text) Please use Epic Secure Chat for non-urgent communication (7AM-7PM)  If 7PM-7AM, please contact night-coverage [email or web address], 09/22/2022, 11:03 AM

## 2022-09-22 NOTE — Progress Notes (Signed)
[facility] for Infectious Disease    Date of Admission:  09/17/2022   Total days of antibiotics 6/ day 5 since improvement           ID: Rachel Huber is a 67 y.o. female with  staph capitis bacteremia 2/2 portacath infection Principal Problem:   Bacteremia Active Problems:   Type 2 diabetes mellitus with hyperlipidemia (HCC)   Thalassemia minor   HLD (hyperlipidemia)   Acute kidney injury superimposed on CKD (HCC)   OSA on CPAP   Chronic kidney disease, stage 3b (HCC)   Depression with anxiety   Acute on chronic blood loss anemia   Thrombocytopenia (HCC)   Obesity (BMI 30-39.9)   SIRS (systemic inflammatory response syndrome) (HCC)   Sepsis ([place])   Alcoholic cirrhosis of liver with ascites (HCC)    Subjective: Afebrile, tolerating cefazolin  Medications:   acidophilus  2 capsule Oral TID   atorvastatin  10 mg Oral Daily   Chlorhexidine Gluconate Cloth  6 each Topical Daily   ciprofloxacin  500 mg Oral Q breakfast   cyanocobalamin  1,000 mcg Oral Daily   gabapentin  800 mg Oral BID   lactulose  20 g Oral Daily   midodrine  10 mg Oral TID WC   polyethylene glycol  17 g Oral Daily   sertraline  50 mg Oral Daily   sodium chloride flush  3 mL Intravenous Q12H    Objective: Vital signs in last 24 hours: Temp:  [97.6 F (36.4 C)-98.5 F (36.9 C)] 98.5 F (36.9 C) (02/05 1311) Pulse Rate:  [65-69] 65 (02/05 1311) Resp:  [18-20] 18 (02/05 1311) BP: (120-125)/(50-56) 125/56 (02/05 1311) SpO2:  [98 %-100 %] 100 % (02/05 1311) Weight:  [143.3 kg] 143.3 kg (02/05 0500)   Did not examine  Lab Results Recent Labs    09/20/22 0508 09/21/22 0455  WBC 6.4 4.8  HGB 6.9* 8.4*  HCT 21.7* 25.8*  NA 136 134*  K 3.8 3.7  CL 109 107  CO2 21* 20*  BUN 37* 35*  CREATININE 1.70* 1.52*   Liver Panel Recent Labs    09/20/22 0508 09/21/22 0455  PROT 4.2* 4.4*  ALBUMIN 2.1* 2.1*  AST 44* 41  ALT 21 22  ALKPHOS 83 89  BILITOT 1.8* 1.7*       Microbiology: 1/31 blood cx staph capitis 2/1 blood cx NGTD Studies/Results: US Paracentesis  Result Date: 09/22/2022 Lavonia Dana, MD     09/22/2022 12:14 PM PreOperative Dx: Cirrhosis, ascites Postoperative Dx: Cirrhosis, ascites Procedure:   US guided paracentesis Radiologist:  Thornton Papas Anesthesia:  10 ml of1% lidocaine Specimen:  7 L of yellow ascitic fluid EBL:   < 1 ml Complications:  None   [place] EKG SITE RITE  Result Date: 09/22/2022 If Site Rite image not attached, placement could not be confirmed due to current cardiac rhythm.  ECHOCARDIOGRAM COMPLETE  Result Date: 09/21/2022    ECHOCARDIOGRAM REPORT   Patient Name:   Rachel Huber Date of Exam: 09/21/2022 Medical Rec #:  [id number]      Height:       68.0 in Accession #:    [id number]     Weight:       300.4 lb Date of Birth:  Jan 20, 1956       BSA:          2.429 m Patient Age:    34 years       BP:  122/59 mmHg Patient Gender: F              HR:           68 bpm. Exam Location:  [facility] Procedure: 2D Echo, Cardiac Doppler and Color Doppler Indications:    Bacteremia  History:        Patient has prior history of Echocardiogram examinations, most                 recent 10/26/2020. Signs/Symptoms:Dyspnea; Risk Factors:Sleep                 Apnea, Hypertension and Diabetes. Thalassemia minor, anemia in                 CKD, cirrhosis NASH.  Sonographer:    Eartha Inch Referring Phys: [id number] Mignon Pine  Sonographer Comments: Image acquisition challenging due to patient body habitus and Image acquisition challenging due to respiratory motion. IMPRESSIONS  1. Left ventricular ejection fraction, by estimation, is 65 to 70%. Left ventricular ejection fraction by 2D MOD biplane is 66.9 %. The left ventricle has normal function. The left ventricle has no regional wall motion abnormalities. The left ventricular internal cavity size was mildly dilated. Left ventricular diastolic parameters were normal.  2. Right ventricular systolic  function is normal. The right ventricular size is normal. Tricuspid regurgitation signal is inadequate for assessing PA pressure.  3. Left atrial size was moderately dilated.  4. The mitral valve is abnormal. Mild mitral valve regurgitation.  5. The aortic valve is tricuspid. Aortic valve regurgitation is not visualized.  6. The inferior vena cava is dilated in size with <50% respiratory variability, suggesting right atrial pressure of 15 mmHg. Comparison(s): Changes from prior study are noted. 10/26/2020: LVEF 55-60%. Conclusion(s)/Recommendation(s): No evidence of valvular vegetations on this transthoracic echocardiogram. Consider a transesophageal echocardiogram to exclude infective endocarditis if clinically indicated. FINDINGS  Left Ventricle: Left ventricular ejection fraction, by estimation, is 65 to 70%. Left ventricular ejection fraction by 2D MOD biplane is 66.9 %. The left ventricle has normal function. The left ventricle has no regional wall motion abnormalities. The left ventricular internal cavity size was mildly dilated. There is no left ventricular hypertrophy. Left ventricular diastolic parameters were normal. Right Ventricle: The right ventricular size is normal. No increase in right ventricular wall thickness. Right ventricular systolic function is normal. Tricuspid regurgitation signal is inadequate for assessing PA pressure. Left Atrium: Left atrial size was moderately dilated. Right Atrium: Right atrial size was normal in size. Pericardium: There is no evidence of pericardial effusion. Mitral Valve: The mitral valve is abnormal. Mild to moderate mitral annular calcification. Mild mitral valve regurgitation. Tricuspid Valve: The tricuspid valve is not well visualized. Tricuspid valve regurgitation is not demonstrated. Aortic Valve: The aortic valve is tricuspid. Aortic valve regurgitation is not visualized. Aortic valve mean gradient measures 8.0 mmHg. Aortic valve peak gradient measures 18.0  mmHg. Aortic valve area, by VTI measures 1.96 cm. Pulmonic Valve: The pulmonic valve was grossly normal. Pulmonic valve regurgitation is trivial. Aorta: The aortic root and ascending aorta are structurally normal, with no evidence of dilitation. Venous: The inferior vena cava is dilated in size with less than 50% respiratory variability, suggesting right atrial pressure of 15 mmHg. IAS/Shunts: No atrial level shunt detected by color flow Doppler.  LEFT VENTRICLE PLAX 2D                        Biplane EF (MOD) LVIDd:  6.00 cm         LV Biplane EF:   Left LVIDs:         3.70 cm                          ventricular LV PW:         1.00 cm                          ejection LV IVS:        0.80 cm                          fraction by LVOT diam:     2.00 cm                          2D MOD LV SV:         90                               biplane is LV SV Index:   37                               66.9 %. LVOT Area:     3.14 cm                                Diastology                                LV e' medial:    6.53 cm/s LV Volumes (MOD)               LV E/e' medial:  17.2 LV vol d, MOD    110.0 ml      LV e' lateral:   10.80 cm/s A2C:                           LV E/e' lateral: 10.4 LV vol d, MOD    164.0 ml A4C: LV vol s, MOD    36.5 ml A2C: LV vol s, MOD    52.9 ml A4C: LV SV MOD A2C:   73.5 ml LV SV MOD A4C:   164.0 ml LV SV MOD BP:    89.5 ml RIGHT VENTRICLE             IVC RV S prime:     14.70 cm/s  IVC diam: 2.30 cm TAPSE (M-mode): 2.7 cm LEFT ATRIUM              Index        RIGHT ATRIUM           Index LA diam:        5.50 cm  2.26 cm/m   RA Area:     21.20 cm LA Vol (A2C):   114.0 ml 46.92 ml/m  RA Volume:   59.40 ml  24.45 ml/m LA Vol (A4C):   104.0 ml 42.81 ml/m LA Biplane Vol: 112.0 ml 46.10 ml/m  AORTIC VALVE AV Area (Vmax):    1.81 cm AV Area (Vmean):   1.84 cm AV  Area (VTI):     1.96 cm AV Vmax:           212.00 cm/s AV Vmean:          138.000 cm/s AV VTI:            0.461 m AV Peak Grad:       18.0 mmHg AV Mean Grad:      8.0 mmHg LVOT Vmax:         122.00 cm/s LVOT Vmean:        80.800 cm/s LVOT VTI:          0.288 m LVOT/AV VTI ratio: 0.62  AORTA Ao Root diam: 3.30 cm Ao Asc diam:  3.20 cm MITRAL VALVE MV Area (PHT): 2.32 cm     SHUNTS MV Decel Time: 327 msec     Systemic VTI:  0.29 m MR Peak grad: 120.6 mmHg    Systemic Diam: 2.00 cm MR Mean grad: 82.0 mmHg MR Vmax:      549.00 cm/s MR Vmean:     420.0 cm/s MV E velocity: 112.00 cm/s MV A velocity: 48.00 cm/s MV E/A ratio:  2.33 Lyman Bishop MD Electronically signed by Lyman Bishop MD Signature Date/Time: 09/21/2022/5:02:02 PM    Final      Assessment/Plan: 67yo F with portacath admitted with fevers, leukocytosis found to have CoNS bacteremia thought to be related to portacath as it was recently accessed for blood transfusion  -recommend to remove portacath -continue on cefazolin - after removal can use picc line to be used for IV abtx and then consider further plans for use per hematology team. Or if need replacement of portacath in the future for transfusion needs.  [facility] Pager: [phone]  09/22/2022, 5:32 PM

## 2022-09-22 NOTE — Progress Notes (Signed)
Spoke with primary nurse about PICC placement. PICC team will place PICC line after her port is removed.

## 2022-09-22 NOTE — Progress Notes (Signed)
Physical Therapy Treatment Patient Details Name: Rachel Huber MRN: [id number] DOB: [birth year] Today's Date: 09/22/2022   History of Present Illness Rachel Huber is a 67 year old female with significant history of beta thalassemia minor (status post first treatment with IV Retacrit,on 1/31), aslo H/o cirrhosis NASH,with paracentesis 1/30, CKD 3B, HLD, GERD, internal hemorrhoids, OSA-CPAP, anxiety/depression, chronic anemia -with multiple PRBC transfusion, chronic hemorrhoid with bleeds, insomnia, ....        On 09/17/2022 patient received IV Retacrit through her port a cath in oncology office for her beta thalassemia, also has received iron infusion secondary to chronic anemia, also had paracentesis 09/16/2022.   Proximately 45 minutes after treatment with Retacrit follow-up fever 104.6, was tachycardic with a pulse of 102, blood pressure 104/60 with RR of 17, O2 sat of 95%     Of note patient has chronic ascites, [name] liver cirrhosis requiring intermittent paracentesis, recent EGD revealed portal hypertension, patient had TIPS procedure on Dec 26, 2021 revision in July 2026 2023.    PT Comments    Patient sitting up in chair on therapist arrival; patient pleasant and agreeable to therapy treatment.  PT guides patient in sitting lower extremity there ex to include seated hip flexion, long arc quads and heel toe raises all 2 sets of 10.  Patient then performs sit to stand from recliner to RW with min A.  She takes extra time and effort and needs to use upper extremities to assist to pushing up to stand.  Patient's IV line starts beeping so patient returns to sitting with min A for controlled descent back to the chair and PT notifies RN that patient medication is finished.  Patient will benefit from continued skilled therapy services during the remainder of her hospital stay and at the next recommended venue of care to address deficits and promote return to optimal function.      Recommendations for follow  up therapy are one component of a multi-disciplinary discharge planning process, led by the attending physician.  Recommendations may be updated based on patient status, additional functional criteria and insurance authorization.  Follow Up Recommendations  Home health PT     Assistance Recommended at Discharge Set up Supervision/Assistance  Patient can return home with the following A little help with walking and/or transfers;A little help with bathing/dressing/bathroom;Help with stairs or ramp for entrance;Assistance with cooking/housework   Equipment Recommendations  None recommended by PT    Recommendations for Other Services       Precautions / Restrictions Precautions Precautions: Fall Restrictions Weight Bearing Restrictions: No     Mobility  Bed Mobility                 Patient Response: Anxious  Transfers Overall transfer level: Needs assistance Equipment used: Rolling walker (2 wheels) Transfers: Sit to/from Stand Sit to Stand: Min assist           General transfer comment: sit to stand from chair to RW with min A; takes extra time and needs left upper extremity to assist to push up    Ambulation/Gait     Assistive device: Rolling walker (2 wheels)         General Gait Details: did not attempt to take any steps as IV started beeping and medication drip is finished; RN comes in to assist   Stairs             Wheelchair Mobility    Modified Rankin (Stroke Patients Only)  Balance Overall balance assessment: Needs assistance Sitting-balance support: Feet supported Sitting balance-Leahy Scale: Good Sitting balance - Comments: fair/good seated at edge of chair   Standing balance support: During functional activity, Bilateral upper extremity supported, Reliant on assistive device for balance Standing balance-Leahy Scale: Fair Standing balance comment: fair standing balance with RW; forward flexed trunk                             Cognition Arousal/Alertness: Awake/alert Behavior During Therapy: WFL for tasks assessed/performed Overall Cognitive Status: Within Functional Limits for tasks assessed                                 General Comments: patient pleasant and cooperative with therapy        Exercises Other Exercises Other Exercises: seated heel/toe raises, LAQs and hip flexion 2 x 10 each    General Comments General comments (skin integrity, edema, etc.): VSS on RA      Pertinent Vitals/Pain Pain Assessment Pain Assessment: 0-10 Pain Score: 3  Pain Location: low back pain Pain Intervention(s): Limited activity within patient's tolerance, Monitored during session    Home Living Family/patient expects to be discharged to:: Private residence Living Arrangements: Children;Spouse/significant other Available Help at Discharge: Family;Available 24 hours/day Type of Home: House Home Access: Stairs to enter Entrance Stairs-Rails: None Entrance Stairs-Number of Steps: 2 steps into [employer] Layout: One level Home Equipment: Conservation officer, nature (2 wheels);Adaptive equipment;Cane - single point;Tub bench      Prior Function            PT Goals (current goals can now be found in the care plan section) Acute Rehab PT Goals Patient Stated Goal: return home with family to assist PT Goal Formulation: With patient Time For Goal Achievement: 09/25/22 Potential to Achieve Goals: Good Progress towards PT goals: Progressing toward goals    Frequency    Min 3X/week      PT Plan Current plan remains appropriate    Co-evaluation              AM-PAC PT "6 Clicks" Mobility   Outcome Measure  Help needed turning from your back to your side while in a flat bed without using bedrails?: A Little Help needed moving from lying on your back to sitting on the side of a flat bed without using bedrails?: A Little Help needed moving to and from a bed to a chair (including a  wheelchair)?: A Little Help needed standing up from a chair using your arms (e.g., wheelchair or bedside chair)?: A Little Help needed to walk in hospital room?: A Lot Help needed climbing 3-5 steps with a railing? : A Lot 6 Click Score: 16    End of Session   Activity Tolerance: Patient tolerated treatment well;Patient limited by fatigue Patient left: with call bell/phone within reach;in chair;with nursing/sitter in room Nurse Communication: Mobility status PT Visit Diagnosis: Unsteadiness on feet (R26.81);Other abnormalities of gait and mobility (R26.89);Muscle weakness (generalized) (M62.81)     Time: 0865-7846 PT Time Calculation (min) (ACUTE ONLY): 33 min  Charges:  $Therapeutic Exercise: 23-37 mins                     3:11 PM, 09/22/22 Kayode Petion Small Joan Avetisyan MPT [facility] [phone]

## 2022-09-22 NOTE — H&P (View-Only) (Signed)
Patient is on the schedule tomorrow for Port-A-Cath removal.  The risks and benefits of the procedure were fully explained to the patient, who gave informed consent.

## 2022-09-22 NOTE — Progress Notes (Addendum)
   Rachel Huber has been requested to perform a transesophageal echocardiogram on Rachel Huber for bacteremia.  After careful review of history and examination, the risks and benefits of transesophageal echocardiogram have been explained including risks of esophageal damage, perforation (1:10,000 risk), bleeding, pharyngeal hematoma as well as other potential complications associated with anesthesia including aspiration, arrhythmia, respiratory failure and death. Alternatives to treatment were discussed, questions were answered. Patient is willing to proceed. I have left a message with short stay scheduling to try to help arrange, final time TBD.  Addendum: scheduled for 10:30am.  Charlie Pitter, PA-C 09/22/2022 9:23 AM

## 2022-09-22 NOTE — Evaluation (Signed)
Occupational Therapy Evaluation Patient Details Name: Rachel Huber MRN: [id number] DOB: [birth year] Today's Date: 09/22/2022   History of Present Illness Rachel Huber is a 67 year old female with significant history of beta thalassemia minor (status post first treatment with IV Retacrit,on 1/31), aslo H/o cirrhosis NASH,with paracentesis 1/30, CKD 3B, HLD, GERD, internal hemorrhoids, OSA-CPAP, anxiety/depression, chronic anemia -with multiple PRBC transfusion, chronic hemorrhoid with bleeds, insomnia, ....        On 09/17/2022 patient received IV Retacrit through her port a cath in oncology office for her beta thalassemia, also has received iron infusion secondary to chronic anemia, also had paracentesis 09/16/2022.   Proximately 45 minutes after treatment with Retacrit follow-up fever 104.6, was tachycardic with a pulse of 102, blood pressure 104/60 with RR of 17, O2 sat of 95%     Of note patient has chronic ascites, Rachel Huber liver cirrhosis requiring intermittent paracentesis, recent EGD revealed portal hypertension, patient had TIPS procedure on Dec 26, 2021 revision in July 2026 2023.   Clinical Impression   Patient admitted for concerns listed above. PTA  pt was able to complete ADL's and IADL's independently and mobilize in her home with a RW and no assist. At this time, pt presents with increased weakness and is requiring increased assist/supervision with mobility and ADL's. Pt appears to fatigue quicker than normal, requiring increased time and rest breaks. OT recommending no follow up once discharged, however OT will follow up acutely to ensure good progress with mobility and ADL's.      Recommendations for follow up therapy are one component of a multi-disciplinary discharge planning process, led by the attending physician.  Recommendations may be updated based on patient status, additional functional criteria and insurance authorization.   Follow Up Recommendations  No OT follow up      Assistance Recommended at Discharge Set up Supervision/Assistance  Patient can return home with the following A little help with walking and/or transfers;A little help with bathing/dressing/bathroom;Assistance with cooking/housework;Help with stairs or ramp for entrance    Functional Status Assessment  Patient has had a recent decline in their functional status and demonstrates the ability to make significant improvements in function in a reasonable and predictable amount of time.  Equipment Recommendations  None recommended by OT    Recommendations for Other Services       Precautions / Restrictions Precautions Precautions: Fall Restrictions Weight Bearing Restrictions: No      Mobility Bed Mobility Overal bed mobility: Modified Independent             General bed mobility comments: Increased time and effort to pull on bed rails to sitting    Transfers Overall transfer level: Modified independent Equipment used: Rolling walker (2 wheels)               General transfer comment: No assist coming to stand or taking steps in the room with RW. Limited endurance      Balance Overall balance assessment: Needs assistance Sitting-balance support: Feet supported, No upper extremity supported Sitting balance-Leahy Scale: Fair Sitting balance - Comments: fair/good seated at EOB   Standing balance support: During functional activity, No upper extremity supported Standing balance-Leahy Scale: Poor Standing balance comment: fair/poor using RW                           ADL either performed or assessed with clinical judgement   ADL Overall ADL's : Needs assistance/impaired  General ADL Comments: Overall supervision to min gaurd assist. Total assist for donning and doffing shoes and socks.     Vision Baseline Vision/History: 1 Wears glasses Ability to See in Adequate Light: 0 Adequate Patient Visual  Report: No change from baseline Vision Assessment?: No apparent visual deficits     Perception Perception Perception Tested?: No   Praxis Praxis Praxis tested?: Not tested    Pertinent Vitals/Pain Pain Assessment Pain Assessment: No/denies pain     Hand Dominance Right   Extremity/Trunk Assessment Upper Extremity Assessment Upper Extremity Assessment: Generalized weakness   Lower Extremity Assessment Lower Extremity Assessment: Defer to PT evaluation   Cervical / Trunk Assessment Cervical / Trunk Assessment: Normal   Communication Communication Communication: No difficulties   Cognition Arousal/Alertness: Awake/alert Behavior During Therapy: WFL for tasks assessed/performed Overall Cognitive Status: Within Functional Limits for tasks assessed                                       General Comments  VSS on RA    Exercises     Shoulder Instructions      Home Living Family/patient expects to be discharged to:: Private residence Living Arrangements: Children;Spouse/significant other Available Help at Discharge: Family;Available 24 hours/day Type of Home: House Home Access: Stairs to enter [employer] of Steps: 2 steps into [employer] Insurance: None Home Layout: One level     Bathroom Shower/Tub: Teacher, early years/pre: Standard     Home Equipment: Conservation officer, nature (2 wheels);Adaptive equipment;Cane - single point;Tub bench Adaptive Equipment: Reacher        Prior Functioning/Environment Prior Level of Function : Independent/Modified Independent             Mobility Comments: household ambulator using RW ADLs Comments: Assisted by family        OT Problem List: Decreased strength;Decreased activity tolerance;Decreased range of motion;Impaired balance (sitting and/or standing);Decreased safety awareness;Cardiopulmonary status limiting activity;Obesity;Impaired UE functional use      OT  Treatment/Interventions: Self-care/ADL training;Therapeutic exercise;Energy conservation;DME and/or AE instruction;Therapeutic activities;Patient/family education;Balance training    OT Goals(Current goals can be found in the care plan section) Acute Rehab OT Goals Patient Stated Goal: To get her strength back OT Goal Formulation: With patient Time For Goal Achievement: 10/06/22 Potential to Achieve Goals: Good ADL Goals Pt Will Perform Grooming: with modified independence;standing Pt Will Perform Lower Body Bathing: with modified independence;sitting/lateral leans;sit to/from stand Pt Will Perform Lower Body Dressing: with modified independence;sitting/lateral leans;sit to/from stand Pt Will Transfer to Toilet: with modified independence;ambulating Pt Will Perform Toileting - Clothing Manipulation and hygiene: with modified independence;sitting/lateral leans;sit to/from stand  OT Frequency: Min 1X/week    Co-evaluation              AM-PAC OT "6 Clicks" Daily Activity     Outcome Measure Help from another person eating meals?: A Little Help from another person taking care of personal grooming?: A Little Help from another person toileting, which includes using toliet, bedpan, or urinal?: A Little Help from another person bathing (including washing, rinsing, drying)?: A Little Help from another person to put on and taking off regular upper body clothing?: A Little Help from another person to put on and taking off regular lower body clothing?: A Little 6 Click Score: 18   End of Session Equipment Utilized During Treatment: Rolling walker (2 wheels) Nurse Communication: Mobility status  Activity Tolerance:  Patient tolerated treatment well Patient left: with call bell/phone within reach;in chair  OT Visit Diagnosis: Unsteadiness on feet (R26.81);Other abnormalities of gait and mobility (R26.89);Muscle weakness (generalized) (M62.81)                Time: 1117-3567 OT Time  Calculation (min): 27 min Charges:  OT General Charges $OT Visit: 1 Visit OT Evaluation $OT Eval Moderate Complexity: 1 Mod OT Treatments $Self Care/Home Management : 8-22 mins  Paulita Fujita, OTR/L [place] 09/22/2022, 1:18 PM

## 2022-09-22 NOTE — Progress Notes (Signed)
Paracentesis complete no signs of distress. 7L of ascites removed.

## 2022-09-22 NOTE — TOC Progression Note (Signed)
Transition of Care [facility]) - Progression Note    Patient Details  Name: Rachel Huber MRN: [id number] Date of Birth: 02-06-1956  [facility]) CM/SW Contact  Shade Flood, LCSW Phone Number: 09/22/2022, 12:59 PM  Clinical Narrative:     TOC following. Per MD, pt needs TEE and may need IV anbx at dc. Per MD, possible dc tomorrow.   Pt has been referred for outpatient PT at dc. Will follow and refer for further care as needed.   Expected Discharge Plan: Home/Self Care Barriers to Discharge: Continued Medical Work up  Expected Discharge Plan and Services       Living arrangements for the past 2 months: Single Family Home                                       Social Determinants of Health (SDOH) Interventions SDOH Screenings   Food Insecurity: No Food Insecurity (09/18/2022)  Housing: Low Risk  (09/18/2022)  Transportation Needs: No Transportation Needs (09/18/2022)  Utilities: Not At Risk (09/18/2022)  Alcohol Screen: Low Risk  (04/04/2022)  Depression (PHQ2-9): Low Risk  (04/04/2022)  Financial Resource Strain: Low Risk  (04/04/2022)  Physical Activity: Inactive (04/04/2022)  Social Connections: Moderately Isolated (04/04/2022)  Stress: No Stress Concern Present (04/04/2022)  Tobacco Use: Low Risk  (09/18/2022)    Readmission Risk Interventions    09/18/2022    2:19 PM 03/26/2022    2:40 PM  Readmission Risk Prevention Plan  Transportation Screening Complete Complete  Medication Review Press photographer) Complete Complete  PCP or Specialist appointment within 3-5 days of discharge Complete Complete  HRI or Home Care Consult Complete Complete  SW Recovery Care/Counseling Consult Complete Complete  Palliative Care Screening Not Applicable Not [place] Not Applicable Not Applicable

## 2022-09-23 ENCOUNTER — Encounter (HOSPITAL_COMMUNITY)
Admission: EM | Disposition: A | Discharge status: Home / Self Care | Payer: Self-pay | Source: Home / Self Care | Attending: Family Medicine

## 2022-09-23 ENCOUNTER — Inpatient Hospital Stay (HOSPITAL_COMMUNITY): Payer: 59 | Admitting: Certified Registered Nurse Anesthetist

## 2022-09-23 ENCOUNTER — Ambulatory Visit
Admission: RE | Admit: 2022-09-23 | Discharge: 2022-09-23 | Disposition: A | Discharge status: Home / Self Care | Payer: 59 | Source: Ambulatory Visit | Attending: Gastroenterology | Admitting: Gastroenterology

## 2022-09-23 ENCOUNTER — Encounter (HOSPITAL_COMMUNITY): Payer: Self-pay | Admitting: Family Medicine

## 2022-09-23 ENCOUNTER — Inpatient Hospital Stay (HOSPITAL_COMMUNITY): Payer: 59

## 2022-09-23 DIAGNOSIS — R7881 Bacteremia: Secondary | ICD-10-CM

## 2022-09-23 DIAGNOSIS — I361 Nonrheumatic tricuspid (valve) insufficiency: Secondary | ICD-10-CM

## 2022-09-23 DIAGNOSIS — I340 Nonrheumatic mitral (valve) insufficiency: Secondary | ICD-10-CM

## 2022-09-23 HISTORY — PX: TEE WITHOUT CARDIOVERSION: SHX5443

## 2022-09-23 HISTORY — PX: PORT-A-CATH REMOVAL: SHX5289

## 2022-09-23 LAB — CBC
HCT: 26.2 % — ABNORMAL LOW (ref 36.0–46.0)
Hemoglobin: 8.1 g/dL — ABNORMAL LOW (ref 12.0–15.0)
MCH: 25.2 pg — ABNORMAL LOW (ref 26.0–34.0)
MCHC: 30.9 g/dL (ref 30.0–36.0)
MCV: 81.6 fL (ref 80.0–100.0)
Platelets: 129 10*3/uL — ABNORMAL LOW (ref 150–400)
RBC: 3.21 MIL/uL — ABNORMAL LOW (ref 3.87–5.11)
RDW: 22.6 % — ABNORMAL HIGH (ref 11.5–15.5)
WBC: 5 10*3/uL (ref 4.0–10.5)
nRBC: 0 % (ref 0.0–0.2)

## 2022-09-23 LAB — BASIC METABOLIC PANEL
Anion gap: 5 (ref 5–15)
BUN: 32 mg/dL — ABNORMAL HIGH (ref 8–23)
CO2: 22 mmol/L (ref 22–32)
Calcium: 7.8 mg/dL — ABNORMAL LOW (ref 8.9–10.3)
Chloride: 109 mmol/L (ref 98–111)
Creatinine, Ser: 1.45 mg/dL — ABNORMAL HIGH (ref 0.44–1.00)
GFR, Estimated: 40 mL/min — ABNORMAL LOW (ref >60)
Glucose, Bld: 166 mg/dL — ABNORMAL HIGH (ref 70–99)
Potassium: 3.8 mmol/L (ref 3.5–5.1)
Sodium: 136 mmol/L (ref 135–145)

## 2022-09-23 LAB — GLUCOSE, CAPILLARY
Glucose-Capillary: 142 mg/dL — ABNORMAL HIGH (ref 70–99)
Glucose-Capillary: 111 mg/dL — ABNORMAL HIGH (ref 70–99)

## 2022-09-23 LAB — CULTURE, BODY FLUID W GRAM STAIN -BOTTLE: Culture: NO GROWTH

## 2022-09-23 LAB — SURGICAL PCR SCREEN
MRSA, PCR: NEGATIVE
Staphylococcus aureus: NEGATIVE

## 2022-09-23 LAB — ECHO TEE

## 2022-09-23 SURGERY — ECHOCARDIOGRAM, TRANSESOPHAGEAL
Anesthesia: General

## 2022-09-23 SURGERY — MINOR REMOVAL PORT-A-CATH
Anesthesia: LOCAL

## 2022-09-23 MED ORDER — PROPOFOL 10 MG/ML IV BOLUS
INTRAVENOUS | Status: AC
  Filled 2022-09-23: qty 20

## 2022-09-23 MED ORDER — PROPOFOL 10 MG/ML IV BOLUS
INTRAVENOUS | Status: DC | PRN
  Administered 2022-09-23: 60 mg via INTRAVENOUS

## 2022-09-23 MED ORDER — SODIUM CHLORIDE 0.9% FLUSH
10.0000 mL | Freq: Two times a day (BID) | INTRAVENOUS | Status: DC
  Administered 2022-09-23 – 2022-09-24 (×2): 10 mL

## 2022-09-23 MED ORDER — LIDOCAINE HCL (CARDIAC) PF 100 MG/5ML IV SOSY
PREFILLED_SYRINGE | INTRAVENOUS | Status: DC | PRN
  Administered 2022-09-23: 40 mg via INTRAVENOUS

## 2022-09-23 MED ORDER — LACTATED RINGERS IV SOLN: INTRAVENOUS | Status: DC | PRN

## 2022-09-23 MED ORDER — PROPOFOL 500 MG/50ML IV EMUL
INTRAVENOUS | Status: DC | PRN
  Administered 2022-09-23: 100 ug/kg/min via INTRAVENOUS

## 2022-09-23 MED ORDER — LIDOCAINE HCL (PF) 2 % IJ SOLN
INTRAMUSCULAR | Status: AC
  Filled 2022-09-23: qty 5

## 2022-09-23 MED ORDER — BUTAMBEN-TETRACAINE-BENZOCAINE 2-2-14 % EX AERO
INHALATION_SPRAY | CUTANEOUS | Status: AC
  Filled 2022-09-23: qty 5

## 2022-09-23 MED ORDER — LIDOCAINE HCL (PF) 1 % IJ SOLN
INTRAMUSCULAR | Status: DC | PRN
  Administered 2022-09-23: 6 mL

## 2022-09-23 MED ORDER — BUTAMBEN-TETRACAINE-BENZOCAINE 2-2-14 % EX AERO
INHALATION_SPRAY | CUTANEOUS | Status: DC | PRN
  Administered 2022-09-23: 1 via TOPICAL

## 2022-09-23 MED ORDER — SODIUM CHLORIDE 0.9% FLUSH: 10.0000 mL | INTRAVENOUS | Status: DC | PRN

## 2022-09-23 MED ORDER — LIDOCAINE HCL (PF) 1 % IJ SOLN
INTRAMUSCULAR | Status: AC
  Filled 2022-09-23: qty 30

## 2022-09-23 SURGICAL SUPPLY — 19 items
APPLICATOR CHLORAPREP 10.5 ORG (MISCELLANEOUS) ×1 IMPLANT
CLOTH BEACON ORANGE TIMEOUT ST (SAFETY) ×1 IMPLANT
DECANTER SPIKE VIAL GLASS SM (MISCELLANEOUS) ×1 IMPLANT
DERMABOND ADVANCED .7 DNX12 (GAUZE/BANDAGES/DRESSINGS) ×1 IMPLANT
DRAPE HALF SHEET 40X57 (DRAPES) IMPLANT
ELECT REM PT RETURN 9FT ADLT (ELECTROSURGICAL) ×1
ELECTRODE REM PT RTRN 9FT ADLT (ELECTROSURGICAL) ×1 IMPLANT
GLOVE BIOGEL PI IND STRL 7.0 (GLOVE) ×2 IMPLANT
GLOVE SURG SS PI 7.5 STRL IVOR (GLOVE) ×2 IMPLANT
GOWN STRL REUS W/TWL LRG LVL3 (GOWN DISPOSABLE) IMPLANT
NDL HYPO 25X1 1.5 SAFETY (NEEDLE) ×1 IMPLANT
NEEDLE HYPO 25X1 1.5 SAFETY (NEEDLE) ×1 IMPLANT
PENCIL SMOKE EVACUATOR COATED (MISCELLANEOUS) IMPLANT
SPONGE GAUZE 2X2 8PLY STRL LF (GAUZE/BANDAGES/DRESSINGS) ×1 IMPLANT
SUT MNCRL AB 4-0 PS2 18 (SUTURE) ×1 IMPLANT
SUT VIC AB 3-0 SH 27 (SUTURE) ×1
SUT VIC AB 3-0 SH 27X BRD (SUTURE) ×1 IMPLANT
SYR CONTROL 10ML LL (SYRINGE) ×1 IMPLANT
TOWEL OR 17X26 4PK STRL BLUE (TOWEL DISPOSABLE) ×1 IMPLANT

## 2022-09-23 NOTE — Progress Notes (Signed)
Pt A&O x 4. +1 edema noted in bilateral lower extremities. DBP low, respirations increased this a.m. Jewelry removed from pt. Informed consent in chart. SCDs placed on pt. Pt was NPO at 0000. MRSA test sent to lab.

## 2022-09-23 NOTE — Interval H&P Note (Signed)
History and Physical Interval Note:  09/23/2022 8:46 AM  Rachel Huber  has presented today for surgery, with the diagnosis of bacteremia.  The various methods of treatment have been discussed with the patient and family. After consideration of risks, benefits and other options for treatment, the patient has consented to  Procedure(s): MINOR REMOVAL PORT-A-CATH (N/A) as a surgical intervention.  The patient's history has been reviewed, patient examined, no change in status, stable for surgery.  I have reviewed the patient's chart and labs.  Questions were answered to the patient's satisfaction.     Aviva Signs

## 2022-09-23 NOTE — Progress Notes (Signed)
ID PROGRESS NOTE   Remains afebrile; underwent TEE which was negative and portacath removal occurred without difficulty.  Micro:  Culture STAPHYLOCOCCUS CAPITIS Abnormal   Report Status 09/21/2022 FINAL  Organism ID, Bacteria STAPHYLOCOCCUS CAPITIS  Resulting Agency [facility]     Susceptibility   Staphylococcus capitis    MIC    CIPROFLOXACIN <=0.5 SENSI... Sensitive    CLINDAMYCIN <=0.25 SENS... Sensitive    ERYTHROMYCIN <=0.25 SENS... Sensitive    GENTAMICIN <=0.5 SENSI... Sensitive    Inducible Clindamycin NEGATIVE Sensitive    OXACILLIN <=0.25 SENS... Sensitive    RIFAMPIN <=0.5 SENSI... Sensitive    TETRACYCLINE <=1 SENSITIVE Sensitive    TRIMETH/SULFA <=10 SENSIT... Sensitive    VANCOMYCIN <=0.5 SENSI... Sensitive          Staph capitis bacteremia, associated with portacath - Nidus of infection removed Patient still needs central access, has needed transfusions on regular basis   Plan: 14 days of cefazolin 2gm IV q8hr using today as day 1 ---------------------------------- Diagnosis: bacteremia  Culture Result: staph epi  Allergies  Allergen Reactions   Gramineae Pollens Other (See Comments)    Sneezing, Running nose   Latex Rash    Contact rash    OPAT Orders Discharge antibiotics to be given via PICC line Discharge antibiotics: Per pharmacy protocol cefazolin 2 gm iv q 8hr  Duration: 14 days  End Date: 10/06/22  [facility] Care Per Protocol:  Home health RN for IV administration and teaching; PICC line care and labs.    Labs weekly while on IV antibiotics: _x_ CBC with differential __x BMP    __ Please leave PIC in place until doctor has seen patient or been notified  Fax weekly labs to [phone]  Clinic Follow Up Appt: 2-3 wk with Dr Baxter Flattery   @ [facility]

## 2022-09-23 NOTE — TOC Progression Note (Signed)
Transition of Care [facility]) - Progression Note    Patient Details  Name: Rachel Huber MRN: [id number] Date of Birth: 12-17-1955  Transition of Care [facility]) CM/SW [place], [place] Phone Number: 09/23/2022, 1:52 PM  Clinical Narrative:    CSW spoke to [facility] with [employer] to provide update. Pam states that she will continue following along to assist in pts needs at D/C. Pam will work to set up [employer] for [employer]. Pt has been referred for outpatient PT at pt request. TOC to follow.   Expected Discharge Plan: Home/Self Care Barriers to Discharge: Continued Medical Work up  Expected Discharge Plan and Services       Living arrangements for the past 2 months: Single Family Home                                       Social Determinants of Health (SDOH) Interventions SDOH Screenings   Food Insecurity: No Food Insecurity (09/18/2022)  Housing: Low Risk  (09/18/2022)  Transportation Needs: No Transportation Needs (09/18/2022)  Utilities: Not At Risk (09/18/2022)  Alcohol Screen: Low Risk  (04/04/2022)  Depression (PHQ2-9): Low Risk  (04/04/2022)  Financial Resource Strain: Low Risk  (04/04/2022)  Physical Activity: Inactive (04/04/2022)  Social Connections: Moderately Isolated (04/04/2022)  Stress: No Stress Concern Present (04/04/2022)  Tobacco Use: Low Risk  (09/23/2022)    Readmission Risk Interventions    09/18/2022    2:19 PM 03/26/2022    2:40 PM  Readmission Risk Prevention Plan  Transportation Screening Complete Complete  Medication Review Press photographer) Complete Complete  PCP or Specialist appointment within 3-5 days of discharge Complete Complete  HRI or Home Care Consult Complete Complete  SW Recovery Care/Counseling Consult Complete Complete  Palliative Care Screening Not Applicable Not [place] Not Applicable Not Applicable

## 2022-09-23 NOTE — Transfer of Care (Signed)
Immediate Anesthesia Transfer of Care Note  Patient: Rachel Huber  Procedure(s) Performed: TRANSESOPHAGEAL ECHOCARDIOGRAM (TEE)  Patient Location: PACU  Anesthesia Type:MAC  Level of Consciousness: drowsy  Airway & Oxygen Therapy: Patient Spontanous Breathing and Patient connected to nasal cannula oxygen  Post-op Assessment: Report given to RN and Post -op Vital signs reviewed and stable  Post vital signs: Reviewed and stable  Last Vitals:  Vitals Value Taken Time  BP 124/52   Temp 98.2   Pulse 86   Resp 14   SpO2 94%     Last Pain:  Vitals:   09/23/22 1030  TempSrc:   PainSc: 0-No pain         Complications: No notable events documented.

## 2022-09-23 NOTE — CV Procedure (Signed)
CV Procedure Note   Procedure: Transesophageal echocardiogram Physician: Dr Carlyle Dolly  Indication: Bacteremia   Patient was brought to the procedure suite after appropriate consent was obtained. The posterior oropharnxy was anesthesized with cetacaine spray, patient placed in the left lateral decubitis position and bite block placed. Sedation was achieved with the assistance of anesthesiology, for details please refer to there documentation. TEE probe intubated into esophagus without difficulty and several views were obtained. Please see TEE report for full findings, in summary no evidence of vegetations or endocarditis. Cardiopulmonary monitoring was performed throughout the procedure, she tolerated well without complications   Carlyle Dolly MD

## 2022-09-23 NOTE — H&P (Signed)
Procedure H&P  Patient referred for TEE in setting of bacteremia. Plan for procedure today with the assistance of anesthesiology. For full medical history please refer to referenced admission H&P below.  Carlyle Dolly MD    Patient: Rachel Huber                                                                 PCP: [name], MD                    DOB: 1956/05/27                                                                        DOA: 09/17/2022 [id number]:[id number]                                                                    DOS: 09/18/2022, 8:10 AM   [name], MD   Patient coming from:   HOME  I have personally reviewed patient's medical records, in electronic medical records, including:  [place] link, and care everywhere.      Chief Complaint:       Chief Complaint  Patient presents with   Altered Mental Status   Fever      History of present illness:      Rachel Huber is a 67 year old female with significant history of beta thalassemia minor (status post first treatment with IV Retacrit,on 1/31), aslo H/o cirrhosis NASH,with paracentesis 1/30, CKD 3B, HLD, GERD, internal hemorrhoids, OSA-CPAP, anxiety/depression, chronic anemia -with multiple PRBC transfusion, chronic hemorrhoid with bleeds, insomnia, ....     On 09/17/2022 patient received IV Retacrit through her port a cath in oncology office for her beta thalassemia, also has received iron infusion secondary to chronic anemia, also had paracentesis 09/16/2022.  Proximately 45 minutes after treatment with Retacrit follow-up fever 104.6, was tachycardic with a pulse of 102, blood pressure 104/60 with RR of 17, O2 sat of 95%   Of note patient has chronic ascites, [name] liver cirrhosis requiring intermittent paracentesis, recent EGD revealed portal hypertension, patient had TIPS procedure on Dec 26, 2021 revision in July 2026 2023.   She was subsequently asked to admit for SARS criteria, to rule out  sepsis.       Patient Denies having: Fever, Chills, Cough, SOB, Chest Pain, Abd pain, N/V/D, headache, dizziness, lightheadedness,  Dysuria, Joint pain, rash, open wounds   ED Course:   Blood pressure (!) 109/44, pulse 86, temperature 99.5 F (37.5 C), temperature source Oral, resp. rate 10, height '5\' 8"'$  (1.727 m), weight (!) 140 kg, SpO2 93 %. Abnormal labs;     Review of Systems: As per HPI, otherwise 10 point review of systems were negative.    ----------------------------------------------------------------------------------------------------------------------  Allergies  Allergen Reactions   Gramineae Pollens Other (See Comments)      Sneezing, Running nose   Latex Rash      Contact rash      Home MEDs:         Prior to Admission medications   Medication Sig Start Date End Date Taking? Authorizing Provider  acetaminophen (TYLENOL) 650 MG CR tablet Take 1,300 mg by mouth daily as needed for pain.     Yes [provider]  albuterol (VENTOLIN HFA) 108 (90 Base) MCG/ACT inhaler Inhale 2 puffs into the lungs every 6 (six) hours as needed for wheezing or shortness of breath. 02/10/22   Yes Wieting, Richard, MD  atorvastatin (LIPITOR) 10 MG tablet Take 1 tablet (10 mg total) by mouth daily. 06/20/22   Yes Fritzi Mandes, MD  diphenhydrAMINE (BENADRYL) 25 MG tablet Take 25 mg by mouth 2 (two) times daily as needed for itching.     Yes [provider]  ergocalciferol (VITAMIN D2) 1.25 MG (50000 UT) capsule Take 1 capsule (50,000 Units total) by mouth every 14 (fourteen) days. 09/01/22   Yes Pennington, Rebekah M, PA-C  furosemide (LASIX) 40 MG tablet Take 40 mg by mouth daily. 06/24/22 06/24/23 Yes [provider]  gabapentin (NEURONTIN) 400 MG capsule Take 800 mg by mouth 2 (two) times daily. 01/07/18   Yes [provider]  hydrocortisone (ANUCORT-HC) 25 MG suppository Place 1 suppository (25 mg total) rectally 2 (two) times daily as needed for anal  itching or hemorrhoids. 06/20/22   Yes Fritzi Mandes, MD  lidocaine-prilocaine (EMLA) cream Apply 1 Application topically See admin instructions for 1 dose. Apply to port site 1 hour prior to procedure 06/23/22   Yes Clapp, Alysia Penna, NP  melatonin 5 MG TABS Take 10 mg by mouth at bedtime as needed (sleep).     Yes [provider]  Multiple Vitamins-Minerals (MULTIVITAMIN WITH MINERALS) tablet Take 1 tablet by mouth daily.     Yes [provider]  polyethylene glycol (MIRALAX / GLYCOLAX) 17 g packet Take 17 g by mouth 2 (two) times daily. 06/20/22   Yes Fritzi Mandes, MD  sertraline (ZOLOFT) 50 MG tablet Take 50 mg by mouth daily. 12/12/17   Yes [provider]  vitamin B-12 (CYANOCOBALAMIN) 1000 MCG tablet Take 1,000 mcg by mouth daily.     Yes [provider]  hydrocortisone (ANUSOL-HC) 2.5 % rectal cream Place rectally 4 (four) times daily. Patient not taking: Reported on 09/18/2022 03/07/22     Loletha Grayer, MD  lactulose, encephalopathy, (CHRONULAC) 10 GM/15ML SOLN Take 30 mLs (20 g total) by mouth daily. Patient not taking: Reported on 09/18/2022 01/30/22     Roxan Hockey, MD  witch hazel-glycerin (TUCKS) pad Apply topically as needed for hemorrhoids. Patient not taking: Reported on 09/18/2022 03/07/22     Loletha Grayer, MD      PRN MEDs: acetaminophen **OR** acetaminophen, albuterol, bisacodyl, diphenhydrAMINE, hydrALAZINE, HYDROmorphone (DILAUDID) injection, ipratropium, levalbuterol, ondansetron **OR** ondansetron (ZOFRAN) IV, oxyCODONE, senna-docusate, sodium phosphate, traMADol, traZODone       Past Medical History:  Diagnosis Date   Anemia in chronic kidney disease (CKD) 02/07/2022   Anxiety     Arthritis     Cirrhosis of liver ([place])     Diabetes mellitus without complication ([place])     Dyspnea      DOE   GERD (gastroesophageal reflux disease)     Grade IV internal hemorrhoids      Contingency plan for  any future admissions for severe anemia in the  setting of persistent GI bleed:  nuclear medicine tagged RBC scan to assess for location of GI bleeding - if from rectum, would then proceed to angiogram with possible repeat embolization.  Please notify IR in this case.   Hepatitis      PAST   Hypertension     Neuropathy     Neuropathy, diabetic ([place])     Pneumonia     Sinus complaint     Sleep apnea      CPAP   Thalassemia minor 1992           Past Surgical History:  Procedure Laterality Date   ABDOMINAL HYSTERECTOMY       BREAST BIOPSY Right 02/15/2018    [place] bx 6-6:30 ribbon shape, ONE CORE FRAGMENT WITH FIBROSIS. ONE CORE FRAGMENT WITH PORTION OF A DILATED   BREAST BIOPSY Right 02/15/2018    [place] bx 9:00 heart shape, USUAL DUCTAL HYPERPLASIA   BREAST LUMPECTOMY Right 03/09/2018    Procedure: BREAST LUMPECTOMY x 2;  Surgeon: Benjamine Sprague, DO;  Location: [facility] ORS;  Service: General;  Laterality: Right;   CATARACT EXTRACTION W/PHACO Left 06/11/2016    Procedure: CATARACT EXTRACTION PHACO AND INTRAOCULAR LENS PLACEMENT (IOC);  Surgeon: Estill Cotta, MD;  Location: [facility] ORS;  Service: Ophthalmology;  Laterality: Left;  Lot # [id number] H US:01:38.6 AP%:26.4 CDE:44.15   CATARACT EXTRACTION W/PHACO Right 06/15/2018    Procedure: CATARACT EXTRACTION PHACO AND INTRAOCULAR LENS PLACEMENT (IOC);  Surgeon: Birder Robson, MD;  Location: [facility] ORS;  Service: Ophthalmology;  Laterality: Right;  [place] 00:38.2 CDE 4.23 Fluid Pack Lot # [id number]   COLONOSCOPIES       COLONOSCOPY WITH PROPOFOL N/A 10/10/2020    Procedure: COLONOSCOPY WITH PROPOFOL;  Surgeon: Lesly Rubenstein, MD;  Location[facility] ENDOSCOPY;  Service: Endoscopy;  Laterality: N/A;   COLONOSCOPY WITH PROPOFOL N/A 11/20/2020    Procedure: COLONOSCOPY WITH PROPOFOL;  Surgeon: Lesly Rubenstein, MD;  Location: [facility] ENDOSCOPY;  Service: Endoscopy;  Laterality: N/A;  DM STAT CBC, BMP COVID POSITIVE 09/02/2020   COLONOSCOPY WITH PROPOFOL N/A 06/19/2022    Procedure: COLONOSCOPY WITH  PROPOFOL;  Surgeon: Lesly Rubenstein, MD;  Location: [facility] ENDOSCOPY;  Service: Endoscopy;  Laterality: N/A;   CTR       ESOPHAGOGASTRODUODENOSCOPY (EGD) WITH PROPOFOL N/A 10/10/2020    Procedure: ESOPHAGOGASTRODUODENOSCOPY (EGD) WITH PROPOFOL;  Surgeon: Lesly Rubenstein, MD;  Location: [facility] ENDOSCOPY;  Service: Endoscopy;  Laterality: N/A;  COVID POSITIVE 10/08/2020   FLEXIBLE SIGMOIDOSCOPY N/A 02/06/2022    Procedure: FLEXIBLE SIGMOIDOSCOPY;  Surgeon: Lesly Rubenstein, MD;  Location: [facility] ENDOSCOPY;  Service: Endoscopy;  Laterality: N/A;  Patient requests anesthesia   IR ANGIOGRAM SELECTIVE EACH ADDITIONAL VESSEL   02/07/2022   IR ANGIOGRAM SELECTIVE EACH ADDITIONAL VESSEL   02/07/2022   IR ANGIOGRAM SELECTIVE EACH ADDITIONAL VESSEL   09/05/2022   IR ANGIOGRAM VISCERAL SELECTIVE   02/07/2022   IR ANGIOGRAM VISCERAL SELECTIVE   09/05/2022   IR EMBO ART  VEN HEMORR LYMPH EXTRAV  INC GUIDE ROADMAPPING   09/05/2022   IR EMBO ARTERIAL NOT HEMORR HEMANG INC GUIDE ROADMAPPING   02/07/2022   IR IMAGING GUIDED PORT INSERTION   06/13/2022   IR PARACENTESIS   12/17/2021   IR PARACENTESIS   03/25/2022   IR RADIOLOGIST EVAL & MGMT   03/18/2022   IR RADIOLOGIST EVAL & MGMT   05/07/2022   IR RADIOLOGIST EVAL & MGMT   05/16/2022  IR US GUIDE VASC ACCESS RIGHT   02/07/2022   IR US GUIDE VASC ACCESS RIGHT   09/05/2022   JOINT REPLACEMENT       KNEE SURGERY Right 09/24/2017    plates and pins   TOTAL SHOULDER ARTHROPLASTY Right 09/27/2015       reports that she has never smoked. She has never used smokeless tobacco. She reports that she does not drink alcohol and does not use drugs.          Family History  Problem Relation Age of Onset   Breast cancer Mother 84   Lymphoma Mother     Diabetes Father     Kidney cancer Father     Heart disease Father     Diabetes Sister     Breast cancer Sister 54      Physical Exam:          Vitals:    09/18/22 0600 09/18/22 0625 09/18/22 0640 09/18/22 0641  BP:  (!) 112/45 (!) 110/42 (!) 109/44 (!) 109/44  Pulse: 88 86 86    Resp: '19 20 10    '$ Temp:   99.6 F (37.6 C) 99.5 F (37.5 C) 99.5 F (37.5 C)  TempSrc:   Oral Oral Oral  SpO2: 92%   92% 93%  Weight:          Height:            Constitutional: NAD, calm, comfortable Eyes: PERRL, lids and conjunctivae normal ENMT: Mucous membranes are moist. Posterior pharynx clear of any exudate or lesions.Normal dentition.  Neck: normal, supple, no masses, no thyromegaly Respiratory: clear to auscultation bilaterally, no wheezing, no crackles. Normal respiratory effort. No accessory muscle use.  Cardiovascular: Regular rate and rhythm, no murmurs / rubs / gallops. No extremity edema. 2+ pedal pulses. No carotid bruits.  Abdomen: no tenderness, no masses palpated. No hepatosplenomegaly. Bowel sounds positive.  Musculoskeletal: no clubbing / cyanosis. No joint deformity upper and lower extremities. Good ROM, no contractures. Normal muscle tone.  Neurologic: CN II-XII grossly intact. Sensation intact, DTR normal. Strength 5/5 in all 4.  Psychiatric: Normal judgment and insight. Alert and oriented x 3. Normal mood.  Skin: no rashes, lesions, ulcers. No induration Decubitus/ulcers:  Wounds: per nursing documentation               Labs on admission:    I have personally reviewed following labs and imaging studies   CBC: Last Labs      Recent Labs  Lab 09/17/22 1322 09/17/22 2111  WBC 4.7 16.0*  NEUTROABS 3.5 14.9*  HGB 7.1* 6.7*  HCT 22.6* 20.8*  MCV 80.4 78.8*  PLT 116* 123*      Basic Metabolic Panel: Last Labs     Recent Labs  Lab 09/17/22 2111  NA 134*  K 4.6  CL 107  CO2 20*  GLUCOSE 168*  BUN 30*  CREATININE 1.41*  CALCIUM 7.9*      GFR: Estimated Creatinine Clearance: 60 mL/min (A) (by C-G formula based on SCr of 1.41 mg/dL (H)). Liver Function Tests: Last Labs     Recent Labs  Lab 09/17/22 2111  AST 53*  ALT 23  ALKPHOS 98  BILITOT 3.0*  PROT 4.8*   ALBUMIN 2.6*      Last Labs  No results for input(s): "LIPASE", "AMYLASE" in the last 168 hours.   Last Labs  No results for input(s): "AMMONIA" in the last 168 hours.   Coagulation Profile: Last Labs  Recent Labs  Lab 09/17/22 2111  INR 1.4*      Cardiac Enzymes: Last Labs  No results for input(s): "CKTOTAL", "CKMB", "CKMBINDEX", "TROPONINI" in the last 168 hours.   BNP (last 3 results) Recent Labs (within last 365 days)  No results for input(s): "PROBNP" in the last 8760 hours.   HbA1C: Recent Labs (last 2 labs)  No results for input(s): "HGBA1C" in the last 72 hours.   CBG: Last Labs  No results for input(s): "GLUCAP" in the last 168 hours.   Lipid Profile: Recent Labs (last 2 labs)  No results for input(s): "CHOL", "HDL", "LDLCALC", "TRIG", "CHOLHDL", "LDLDIRECT" in the last 72 hours.   Thyroid Function Tests: Recent Labs (last 2 labs)  No results for input(s): "TSH", "T4TOTAL", "FREET4", "T3FREE", "THYROIDAB" in the last 72 hours.   Anemia Panel: Recent Labs (last 2 labs)  No results for input(s): "VITAMINB12", "FOLATE", "FERRITIN", "TIBC", "IRON", "RETICCTPCT" in the last 72 hours.   Urine analysis: Labs (Brief)          Component Value Date/Time    COLORURINE AMBER (A) 09/17/2022 2120    APPEARANCEUR CLEAR 09/17/2022 2120    LABSPEC 1.018 09/17/2022 2120    PHURINE 5.0 09/17/2022 2120    GLUCOSEU NEGATIVE 09/17/2022 2120    HGBUR NEGATIVE 09/17/2022 2120    [place] NEGATIVE 09/17/2022 2120    [place] NEGATIVE 09/17/2022 2120    PROTEINUR NEGATIVE 09/17/2022 2120    NITRITE NEGATIVE 09/17/2022 2120    LEUKOCYTESUR NEGATIVE 09/17/2022 2120        Last A1C:  Recent Labs       Lab Results  Component Value Date    HGBA1C 5.5 07/01/2022          Radiologic Exams on Admission:     Imaging Results (Last 48 hours)  CT CHEST ABDOMEN PELVIS W CONTRAST   Result Date: 09/18/2022 CLINICAL DATA:  Pneumonia, complication suspected.  Sepsis and fever. EXAM: CT CHEST, ABDOMEN, AND PELVIS WITH CONTRAST TECHNIQUE: Multidetector CT imaging of the chest, abdomen and pelvis was performed following the standard protocol during bolus administration of intravenous contrast. RADIATION DOSE REDUCTION: This exam was performed according to the departmental dose-optimization program which includes automated exposure control, adjustment of the mA and/or kV according to patient size and/or use of iterative reconstruction technique. CONTRAST:  148m OMNIPAQUE IOHEXOL 300 MG/ML  SOLN COMPARISON:  02/05/2022. FINDINGS: CT CHEST FINDINGS Cardiovascular: The heart is enlarged and there is no pericardial effusion. The distal tip of a right chest port terminates in the right atrium. There is atherosclerotic calcification of the aorta without evidence of aneurysm. The pulmonary trunk is distended suggesting underlying pulmonary artery hypertension. Mediastinum/Nodes: No mediastinal, hilar, or axillary lymphadenopathy. The thyroid gland, trachea, and esophagus are within normal limits. Lungs/Pleura: Atelectasis is present bilaterally. No effusion or pneumothorax. Musculoskeletal: Shoulder arthroplasty changes are noted on the right. Degenerative changes are present in the thoracic spine. No acute or suspicious osseous abnormality. CT ABDOMEN PELVIS FINDINGS Hepatobiliary: Cirrhotic changes are noted in the liver. No focal abnormality is seen. A tips stent appear stable in position. A large amount of stones are present within the gallbladder. No biliary ductal dilatation. Pancreas: Unremarkable. No pancreatic ductal dilatation or surrounding inflammatory changes. Spleen: The spleen is enlarged measuring 16.8 cm in length. No focal abnormality. Adrenals/Urinary Tract: The adrenal glands are within normal limits. No renal calculus or hydronephrosis. The bladder is unremarkable. Stomach/Bowel: There is a small hiatal hernia. Stomach is otherwise within normal  limits.  Appendix is not seen. No evidence of bowel wall thickening, distention, or inflammatory changes. No free air or pneumatosis. Vascular/Lymphatic: Aortic atherosclerosis. No enlarged abdominal or pelvic lymph nodes. Reproductive: Status post hysterectomy. No adnexal masses. Other: Moderate-to-large ascites. Anasarca is noted. There is an umbilical hernia containing ascites. Musculoskeletal: Degenerative changes are present in the lumbar spine. No acute osseous abnormality. IMPRESSION: 1. No acute process in the chest, abdomen, and pelvis. 2. Morphologic changes of cirrhosis and portal hypertension. 3. Moderate-to-large ascites. 4. Anasarca. 5. Cholelithiasis. 6. Aortic atherosclerosis. 7. Remaining incidental findings as described above. Electronically Signed   By: Brett Fairy M.D.   On: 09/18/2022 02:53    CT Head Wo Contrast   Result Date: 09/18/2022 CLINICAL DATA:  Fever, altered mental status EXAM: CT HEAD WITHOUT CONTRAST TECHNIQUE: Contiguous axial images were obtained from the base of the skull through the vertex without intravenous contrast. RADIATION DOSE REDUCTION: This exam was performed according to the departmental dose-optimization program which includes automated exposure control, adjustment of the mA and/or kV according to patient size and/or use of iterative reconstruction technique. COMPARISON:  09/21/2017 FINDINGS: Brain: No evidence of acute infarction, hemorrhage, hydrocephalus, extra-axial collection or mass lesion/mass effect. Mild subcortical white matter and periventricular small vessel ischemic changes. Vascular: Intracranial atherosclerosis. Skull: Normal. Negative for fracture or focal lesion. Sinuses/Orbits: The visualized paranasal sinuses are essentially clear. The mastoid air cells are unopacified. Other: None. IMPRESSION: No evidence of acute intracranial abnormality. Mild small vessel ischemic changes. Electronically Signed   By: Julian Hy M.D.   On: 09/18/2022 02:44     DG Chest Port 1 View   Result Date: 09/17/2022 CLINICAL DATA:  Possible sepsis EXAM: PORTABLE CHEST 1 VIEW COMPARISON:  02/08/2022 FINDINGS: Cardiac shadow is mildly enlarged but stable. Right chest wall port is now seen in satisfactory position. Lungs are hypoinflated with crowding of the vascular markings. No focal infiltrate is seen. No bony abnormality is noted. IMPRESSION: No acute abnormality noted. Electronically Signed   By: Inez Catalina M.D.   On: 09/17/2022 21:46    US Paracentesis   Result Date: 09/16/2022 INDICATION: Refractory ascites in patient with history of NASH cirrhosis status post TIPS procedure May 2023 and TIPS revision July 2023 at [facility]. She recently underwent a second TIPS revision 08/20/2022. Request received for therapeutic paracentesis with 8 L max. EXAM: ULTRASOUND GUIDED THERAPEUTIC RIGHT UPPER QUADRANT PARACENTESIS MEDICATIONS: 10 mL 1 % lidocaine COMPLICATIONS: None immediate. PROCEDURE: Informed written consent was obtained from the patient after a discussion of the risks, benefits and alternatives to treatment. A timeout was performed prior to the initiation of the procedure. Initial ultrasound scanning demonstrates a moderate amount of ascites within the right upper abdominal quadrant. The right upper abdomen was prepped and draped in the usual sterile fashion. 1% lidocaine was used for local anesthesia. Following this, a 19 gauge,10-cm, Yueh catheter was introduced. An ultrasound image was saved for documentation purposes. The paracentesis was performed. The catheter was removed and a dressing was applied. The patient tolerated the procedure well without immediate post procedural complication. Patient received post-procedure intravenous albumin; see nursing notes for details. FINDINGS: A total of approximately 8 L of clear, yellow fluid was removed. IMPRESSION: Successful ultrasound-guided paracentesis yielding 8 liters of peritoneal fluid. Read by: Narda Rutherford, AGNP-BC  Electronically Signed   By: Sandi Mariscal M.D.   On: 09/16/2022 12:39       EKG:    Independently reviewed.      Orders placed  or performed during the hospital encounter of 09/17/22   ED EKG 12-Lead   ED EKG 12-Lead   EKG 12-Lead    ---------------------------------------------------------------------------------------------------------------------------------------       Assessment / Plan:    Principal Problem:   SIRS (systemic inflammatory response syndrome) (HCC) Active Problems:   Fever   Acute on chronic blood loss anemia   Acute kidney injury superimposed on CKD ([place])   Depression with anxiety   HLD (hyperlipidemia)   Thrombocytopenia (HCC)   Chronic kidney disease, stage 3b (HCC)   Thalassemia minor   Type 2 diabetes mellitus with hyperlipidemia (HCC)   OSA on CPAP   Obesity (BMI 30-39.9)     Assessment and Plan: * SIRS (systemic inflammatory response syndrome) (HCC) -Meeting SIRS criteria versus adverse reaction to IV medication of Retacrit,  - Ruling out sepsis-initiate sepsis protocol -IV fluid resuscitation and broad-spectrum antibiotic, cultures   Acute on chronic blood loss anemia -Likely due to thalassemia minor -Hemoglobin 7.1, 6.7 this morning --History of frequent blood transfusion,  -Baseline hemoglobin: 5.8-8.7   -Transfusing 2U PRBC (pros and cons of blood transfusion discussed-patient agreeable)   Fever -Tmax 104.6 this morning 99.5 -Met SIRS criteria,--- this protocol -Ruling out sepsis reaction to IV medication Retacrit,    Acute kidney injury superimposed on CKD ([place]) -Monitoring -Avoiding nephrotoxins        Lab Results  Component Value Date    CREATININE 1.41 (H) 09/17/2022    CREATININE 1.43 (H) 09/05/2022    CREATININE 1.70 (H) 08/13/2022        Depression with anxiety -Stable continue home medication of sertraline, Neurontin -As needed Xanax   Thrombocytopenia (HCC) - Platelets 116, 123, no signs of bleeding,  monitoring closely For any sites of bleeding or platelets less than 50 will transfuse platelets -Monitoring   HLD (hyperlipidemia) -Sinew statins   Chronic kidney disease, stage 3b ([place]) Monitoring BUN/creatinine closely -Nephrotoxins -Starting IV fluids,   Thalassemia minor -Close follow-up with oncologist -Status post first treatment with IV Retacrit, on 09/17/2022 -Post infusion reaction with fever -Discussed the case with Dr. 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 -Advised close follow-up with PCP regarding weight loss programs -Clear diet and exercise recommended   OSA on CPAP -Continue supplemental oxygen, CPAP nightly   Type 2 diabetes mellitus with hyperlipidemia (HCC) -This morning glucose 168, -Patient is not on any diabetic medication -Will monitor blood sugars closely -Last hemoglobin A1C 5.5 months ago   -Continue statins       Consults called: Oncology Dr. Delton Coombes -------------------------------------------------------------------------------------------------------------------------------------------- DVT prophylaxis:  heparin injection 5,000 Units Start: 09/18/22 1000 TED hose Start: 09/18/22 0735 SCDs Start: 09/18/22 0403     Code Status:   Code Status: Full Code       Family Communication:  none at bedside  (The above findings and plan of care has been discussed with patient in detail, the patient expressed understanding and agreement of above plan)  --------------------------------------------------------------------------------------------------------------------------------------------------   Disposition Plan:  Anticipated 1-2 days Status is: Observation The patient remains OBS appropriate and will d/c  before 2 midnights.     ----------------------------------------------------------------------------------------------------------------------------------------------------   Time spent: > than  71  Min.  Was spent seeing and evaluating the patient, reviewing all medical records, drawn plan of care.   SIGNED: Deatra James, MD, FHM. FAAFP.  [place] [facility], Pager  (Please use [email or web address] to page/ or secure chat through epic) If 7PM-7AM, please contact night-coverage [email or web address],  09/18/2022, 8:10 AM

## 2022-09-23 NOTE — Progress Notes (Addendum)
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 To discharging provider:  please sign these orders via discharge navigator,  Select New Orders & click on the button choice - Manage This Unsigned Work.     Thank you for allowing pharmacy to be a part of this patient's care.  Doreene Eland, PharmD, BCPS, BCIDP Work Cell: [phone] 09/23/2022 3:57 PM

## 2022-09-23 NOTE — Progress Notes (Signed)
PROGRESS NOTE    Patient: Rachel Huber                            PCP: [name], MD                    DOB: 04-09-1956            DOA: 09/17/2022 BOF:[id number]             DOS: 09/23/2022, 12:04 PM   LOS: 5 days   Date of Service: The patient was seen and examined on 09/23/2022  Subjective:   The patient was seen and examined this morning, stable no acute distress Hemodynamically stable, afebrile, normotensive, N.p.o.  Anticipating port a catheter removal today, TEE, PICC line placement   Status post paracentesis 09/22/2022   In discussion with ID and surgery regarding her port A catheter removal v   Brief Narrative:   Rachel Huber is a 67 year old female with significant history of beta thalassemia minor (status post first treatment with IV Retacrit,on 1/31), aslo H/o cirrhosis NASH,with paracentesis 1/30, CKD 3B, HLD, GERD, internal hemorrhoids, OSA-CPAP, anxiety/depression, chronic anemia -with multiple PRBC transfusion, chronic hemorrhoid with bleeds, insomnia, ....   On 09/17/2022 patient received IV Retacrit through her port a cath in oncology office for her beta thalassemia, also has received iron infusion secondary to chronic anemia, also had paracentesis 09/16/2022.  Proximately 45 minutes after treatment with Retacrit follow-up fever 104.6, was tachycardic with a pulse of 102, blood pressure 104/60 with RR of 17, O2 sat of 95%  Of note patient has chronic ascites, [name] liver cirrhosis requiring intermittent paracentesis, recent EGD revealed portal hypertension, patient had TIPS procedure on Dec 26, 2021 revision in July 2026 2023.  She was subsequently asked to admit for SARS criteria, to rule out sepsis.     Assessment & Plan:   Principal Problem:   Bacteremia Active Problems:   Acute on chronic blood loss anemia   SIRS (systemic inflammatory response syndrome) (HCC)   Acute kidney injury superimposed on CKD ([place])   Depression with anxiety   HLD  (hyperlipidemia)   Thrombocytopenia (HCC)   Chronic kidney disease, stage 3b (HCC)   Thalassemia minor   Type 2 diabetes mellitus with hyperlipidemia (HCC)   OSA on CPAP   Obesity (BMI 30-39.9)   Sepsis ([place])   Alcoholic cirrhosis of liver with ascites (HCC)     Assessment and Plan: * Bacteremia -Blood cultures from 09/17/22- Staph capitis bacteremia -pansensitive -Blood cultures from 09/18/22 - NGTD   -?  A catheter-plan for removal -IV vancomycin and Po Cipro >> IV antibiotic will be switched to cefazolin as of 09/21/2022  -2/6 obtaining TEE  -2/6 port A catheter removal - 2/6 Picc line placement-anticipated  -Dr. Juleen China and Dr. Baxter Flattery following  -Discussed with Juleen China then Dr. Baxter Flattery via secure chat   SIRS (systemic inflammatory response syndrome) ([place]) - Resolved -DPO:EUMP criteria versus adverse reaction to IV medication of Retacrit,   Ruled out sepsis-initiate sepsis protocol -IV fluid resuscitation and broad-spectrum antibiotic, cultures: Growing staph capitis ID consulted, D/Ced vancomycin, p.o. Cipro >>> switch to 09/23/2022 removal of Port-A-Cath   Acute on chronic blood loss anemia -Likely due to thalassemia minor -Status post ?1/2U PRBC transfusion 09/18/2022 -09/20/2022 agreed to 2U PRBC transfusion  -Hemoglobin 7.1, 6.7  >>> 7.2 >> 6.9 >> 2 U PRBC >>> 8.4, 8.1     Latest Ref Rng &  Units 09/23/2022    4:23 AM 09/21/2022    4:55 AM 09/20/2022    5:08 AM  CBC  WBC 4.0 - 10.5 K/uL 5.0  4.8  6.4   Hemoglobin 12.0 - 15.0 g/dL 8.1  8.4  6.9   Hematocrit 36.0 - 46.0 % 26.2  25.8  21.7   Platelets 150 - 400 K/uL 129  131  110       --History of frequent blood transfusion,  -Baseline hemoglobin: 5.8-8.7    Fever Met SIRS criteria on admission, sepsis ruled out  -POA: Tmax 104.6,  -Met SIRS criteria,--- this protocol -Ruling out sepsis reaction to IV medication Retacrit,   Acute kidney injury superimposed on CKD ([place]) -Monitoring -Avoiding  nephrotoxins  Lab Results  Component Value Date   CREATININE 1.45 (H) 09/23/2022   CREATININE 1.52 (H) 09/21/2022   CREATININE 1.70 (H) 09/20/2022   -Worsening kidney function, continue with gentle IV fluid hydration  Depression with anxiety -Stable continue home medication of sertraline, Neurontin -As needed Xanax  Thrombocytopenia (HCC) - Platelets 116, 123, 131 -  no signs of bleeding, monitoring closely - For platelets less than 50 will transfuse platelets -Monitoring  HLD (hyperlipidemia) -Sinew statins  Chronic kidney disease, stage 3b ([place]) Monitoring BUN/creatinine closely -Nephrotoxins -Status post gentle IV fluid hydration, Lab Results  Component Value Date   CREATININE 1.45 (H) 09/23/2022   CREATININE 1.52 (H) 09/21/2022   CREATININE 1.70 (H) 09/20/2022     Thalassemia minor -Close follow-up with oncologist -Status post first treatment with IV Retacrit, on 09/17/2022 -Post infusion reaction with fever -Discussed the case with Dr. 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 -hemoglobin 6.7, platelets 123 -No signs of bleeding monitoring closely, transfusing 2 unit PRBC -Hemoglobin 6.9, transfusing another 2U PRBC 09/20/2022     Latest Ref Rng & Units 09/23/2022    4:23 AM 09/21/2022    4:55 AM 09/20/2022    5:08 AM  CBC  WBC 4.0 - 10.5 K/uL 5.0  4.8  6.4   Hemoglobin 12.0 - 15.0 g/dL 8.1  8.4  6.9   Hematocrit 36.0 - 46.0 % 26.2  25.8  21.7   Platelets 150 - 400 K/uL 129  131  578      Alcoholic cirrhosis of liver with ascites (HCC) -Routine ultrasound-guided paracentesis 09/22/22  -Yielding 7 L peritoneal fluid removal  -Post paracentesis according  Sepsis ([place]) Stable, afebrile and normotensive, improved leukocytosis - Sepsis ruled out -Met SIRS criteria on admission  Bacteremic  w staph capitis on 09/17/2022 -- Continue cefazolin per ID -Repeating blood cultures today  09/19/2022 >>> no growth to date   -Discontinue Flagyl/Rocephin, azithromycin -Consulting ID, Dr. Willette Pa. Baxter Flattery following closely  -Planning for port a catheter removal, PICC line placement - TEE -to rule out endocarditis   Obesity (BMI 30-39.9) Body mass index is 46.93 kg/m. -Advised close follow-up with PCP regarding weight loss programs -Clear diet and exercise recommended  OSA on CPAP -Continue supplemental oxygen, CPAP nightly  Type 2 diabetes mellitus with hyperlipidemia (HCC) -This morning glucose 168, -Patient is not on any diabetic medication -Will monitor blood sugars closely -Last hemoglobin A1C 5.5 months ago  -Continue statins  ---------------------------------------------------------------------------------------------------------------------------- Nutritional status:  The patient's BMI is: Body mass index is 48.04 kg/m. I agree with the assessment and plan as outlined ----------------------------------------------------------------------------------------------------------------------------------------- Cultures; Blood Cultures >>> GPC-Staph capitis-pansensitive 2/2/20204 Repeat blood cultures -------------------------------------------------------------------------------------------------------------------------------------  DVT prophylaxis:  SCD's Start: 09/22/22 1913 Place and maintain sequential compression  device Start: 09/18/22 0826 TED hose Start: 09/18/22 0735 SCDs Start: 09/18/22 0403   Code Status:   Code Status: Full Code  Family Communication: No family member present at bedside- attempt will be made to update daily The above findings and plan of care has been discussed with patient (and family)  in detail,  they expressed understanding and agreement of above. -Advance care planning has been discussed.   Admission status:   Status is: Inpatient Remains inpatient appropriate because: Needing IV antibiotics, blood transfusion, ruling  out bacteremia   Disposition: From  - home             Planning for discharge home with home health in 2 days with IV antibiotics  Procedures:   No admission procedures for hospital encounter.   Antimicrobials:  Anti-infectives (From admission, onward)    Start     Dose/Rate Route Frequency Ordered Stop   09/21/22 1400  [MAR Hold]  ceFAZolin (ANCEF) IVPB 2g/100 mL premix        (MAR Hold since Tue 09/23/2022 at 0814.Hold Reason: Transfer to a Procedural area)   2 g 200 mL/hr over 30 Minutes Intravenous Every 8 hours 09/21/22 1029     09/21/22 0800  [MAR Hold]  ciprofloxacin (CIPRO) tablet 500 mg        (MAR Hold since Tue 09/23/2022 at 0814.Hold Reason: Transfer to a Procedural area)   500 mg Oral Daily with breakfast 09/20/22 1629     09/19/22 2030  ceFEPIme (MAXIPIME) 2 g in sodium chloride 0.9 % 100 mL IVPB  Status:  Discontinued        2 g 200 mL/hr over 30 Minutes Intravenous Every 12 hours 09/19/22 0916 09/20/22 1629   09/19/22 0000  vancomycin (VANCOREADY) IVPB 1250 mg/250 mL  Status:  Discontinued        1,250 mg 166.7 mL/hr over 90 Minutes Intravenous Every 24 hours 09/17/22 2335 09/18/22 0740   09/19/22 0000  vancomycin (VANCOREADY) IVPB 1750 mg/350 mL  Status:  Discontinued        1,750 mg 175 mL/hr over 120 Minutes Intravenous Every 24 hours 09/18/22 0755 09/21/22 1029   09/18/22 1200  metroNIDAZOLE (FLAGYL) IVPB 500 mg  Status:  Discontinued        500 mg 100 mL/hr over 60 Minutes Intravenous Every 12 hours 09/18/22 0740 09/20/22 0745   09/18/22 0800  ceFEPIme (MAXIPIME) 2 g in sodium chloride 0.9 % 100 mL IVPB  Status:  Discontinued        2 g 200 mL/hr over 30 Minutes Intravenous Every 8 hours 09/17/22 2335 09/18/22 0740   09/18/22 0800  ceFEPIme (MAXIPIME) 2 g in sodium chloride 0.9 % 100 mL IVPB  Status:  Discontinued        2 g 200 mL/hr over 30 Minutes Intravenous  Once 09/18/22 0740 09/18/22 0750   09/18/22 0800  ceFEPIme (MAXIPIME) 2 g in sodium chloride 0.9 %  100 mL IVPB  Status:  Discontinued        2 g 200 mL/hr over 30 Minutes Intravenous Every 8 hours 09/18/22 0750 09/19/22 0916   09/18/22 0745  vancomycin (VANCOCIN) IVPB 1000 mg/200 mL premix  Status:  Discontinued        1,000 mg 200 mL/hr over 60 Minutes Intravenous  Once 09/18/22 0740 09/18/22 0747   09/17/22 2345  vancomycin (VANCOCIN) IVPB 1000 mg/200 mL premix        1,000 mg 200 mL/hr over 60 Minutes Intravenous  Once 09/17/22  2335 09/18/22 0341   09/17/22 2300  ceFEPIme (MAXIPIME) 2 g in sodium chloride 0.9 % 100 mL IVPB        2 g 200 mL/hr over 30 Minutes Intravenous  Once 09/17/22 2255 09/17/22 2347   09/17/22 2300  metroNIDAZOLE (FLAGYL) IVPB 500 mg        500 mg 100 mL/hr over 60 Minutes Intravenous  Once 09/17/22 2255 09/18/22 0051   09/17/22 2300  vancomycin (VANCOCIN) IVPB 1000 mg/200 mL premix        1,000 mg 200 mL/hr over 60 Minutes Intravenous  Once 09/17/22 2255 09/18/22 0202        Medication:   [MAR Hold] acidophilus  2 capsule Oral TID   [MAR Hold] atorvastatin  10 mg Oral Daily   [MAR Hold] Chlorhexidine Gluconate Cloth  6 each Topical Daily   [MAR Hold] ciprofloxacin  500 mg Oral Q breakfast   [MAR Hold] cyanocobalamin  1,000 mcg Oral Daily   [MAR Hold] gabapentin  800 mg Oral BID   [MAR Hold] lactulose  20 g Oral Daily   [MAR Hold] midodrine  10 mg Oral TID WC   [MAR Hold] polyethylene glycol  17 g Oral Daily   [MAR Hold] sertraline  50 mg Oral Daily   [MAR Hold] sodium chloride flush  3 mL Intravenous Q12H    [MAR Hold] acetaminophen **OR** [MAR Hold] acetaminophen, [MAR Hold] albuterol, [MAR Hold] diphenhydrAMINE, [MAR Hold] hydrALAZINE, [MAR Hold]  HYDROmorphone (DILAUDID) injection, [MAR Hold] ipratropium, [MAR Hold] levalbuterol, lidocaine (PF), [MAR Hold] ondansetron **OR** [MAR Hold] ondansetron (ZOFRAN) IV, [MAR Hold] oxyCODONE, [MAR Hold] senna-docusate, [MAR Hold] sodium phosphate, [MAR Hold] traMADol, [MAR Hold] traZODone   Objective:    Vitals:   09/23/22 1125 09/23/22 1130 09/23/22 1145 09/23/22 1200  BP:  (!) 118/50 (!) 107/51   Pulse: 82 76 72 70  Resp: '13 13 12 12  '$ Temp:      TempSrc:      SpO2: 98% 100% 96% 95%  Weight:      Height:        Intake/Output Summary (Last 24 hours) at 09/23/2022 1204 Last data filed at 09/23/2022 1106 Gross per 24 hour  Intake 1430 ml  Output 900 ml  Net 530 ml   Filed Weights   09/20/22 0633 09/21/22 0621 09/22/22 0500  Weight: (!) 136.4 kg (!) 136.3 kg (!) 143.3 kg     Physical examination:   General:  AAO x 3,  cooperative, no distress;   HEENT:  Normocephalic, PERRL, otherwise with in Normal limits   Neuro:  CNII-XII intact. , normal motor and sensation, reflexes intact   Lungs:   Clear to auscultation BL, Respirations unlabored,  No wheezes / crackles  Cardio:    S1/S2, RRR, No murmure, No Rubs or Gallops   Abdomen:  Soft, non-tender, bowel sounds active all four quadrants, no guarding or peritoneal signs.  Muscular  skeletal:  Limited exam -global generalized weaknesses - in bed, able to move all 4 extremities,   2+ pulses,  symmetric, No pitting edema  Skin:  Dry, warm to touch, negative for any Rashes,  Wounds: Please see nursing documentation         ---------------------------------------------------------------------------------------------------------------------------    LABs:     Latest Ref Rng & Units 09/23/2022    4:23 AM 09/21/2022    4:55 AM 09/20/2022    5:08 AM  CBC  WBC 4.0 - 10.5 K/uL 5.0  4.8  6.4   Hemoglobin 12.0 -  15.0 g/dL 8.1  8.4  6.9   Hematocrit 36.0 - 46.0 % 26.2  25.8  21.7   Platelets 150 - 400 K/uL 129  131  110       Latest Ref Rng & Units 09/23/2022    4:23 AM 09/21/2022    4:55 AM 09/20/2022    5:08 AM  CMP  Glucose 70 - 99 mg/dL 166  123  155   BUN 8 - 23 mg/dL 32  35  37   Creatinine 0.44 - 1.00 mg/dL 1.45  1.52  1.70   Sodium 135 - 145 mmol/L 136  134  136   Potassium 3.5 - 5.1 mmol/L 3.8  3.7  3.8   Chloride 98 -  111 mmol/L 109  107  109   CO2 22 - 32 mmol/L '22  20  21   '$ Calcium 8.9 - 10.3 mg/dL 7.8  7.6  7.5   Total Protein 6.5 - 8.1 g/dL  4.4  4.2   Total Bilirubin 0.3 - 1.2 mg/dL  1.7  1.8   Alkaline Phos 38 - 126 U/L  89  83   AST 15 - 41 U/L  41  44   ALT 0 - 44 U/L  22  21        Micro Results Recent Results (from the past 240 hour(s))  Culture, blood (Routine x 2)     Status: Abnormal   Collection Time: 09/17/22  9:11 PM   Specimen: Porta Cath; Blood  Result Value Ref Range Status   Specimen Description   Final    PORTA CATH Performed at [facility], [address] [place], [place] [address]    Special Requests   Final    BOTTLES DRAWN AEROBIC AND ANAEROBIC Blood Culture adequate volume Performed at [facility], [address] [place], [place] [address]    Culture  Setup Time   Final    GRAM POSITIVE COCCI Gram Stain Report Called to,Read Back By and Verified With: J HUMPHRIES [place] [id number] K FORSYTH AEROBIC BOTTLE ONLY ANAEROBIC BOTTLE ALSO ON 09/18/22 AT 1920 BY LOY,C CRITICAL RESULT CALLED TO, READ BACK BY AND VERIFIED WITH: RN JADA MUSE ON 09/18/22 @ 2106 BY DRT    Culture (A)  Final    STAPHYLOCOCCUS CAPITIS SUSCEPTIBILITIES PERFORMED ON PREVIOUS CULTURE WITHIN THE LAST 5 DAYS. Performed at [facility], [place] [address], [place], [place] [address]    Report Status 09/22/2022 FINAL  Final  Blood Culture ID Panel (Reflexed)     Status: Abnormal   Collection Time: 09/17/22  9:11 PM  Result Value Ref Range Status   Enterococcus faecalis NOT DETECTED NOT DETECTED Final   Enterococcus Faecium NOT DETECTED NOT DETECTED Final   Listeria monocytogenes NOT DETECTED NOT DETECTED Final   Staphylococcus species DETECTED (A) NOT DETECTED Final    Comment: CRITICAL RESULT CALLED TO, READ BACK BY AND VERIFIED WITH: RN JADA MUSE ON 09/18/22 @ 2106 BY DRT    Staphylococcus aureus (BCID) NOT DETECTED NOT DETECTED Final   Staphylococcus epidermidis NOT DETECTED NOT DETECTED Final    Staphylococcus lugdunensis NOT DETECTED NOT DETECTED Final   Streptococcus species NOT DETECTED NOT DETECTED Final   Streptococcus agalactiae NOT DETECTED NOT DETECTED Final   Streptococcus pneumoniae NOT DETECTED NOT DETECTED Final   Streptococcus pyogenes NOT DETECTED NOT DETECTED Final   A.calcoaceticus-baumannii NOT DETECTED NOT DETECTED Final   Bacteroides fragilis NOT DETECTED NOT DETECTED Final   Enterobacterales NOT DETECTED NOT  DETECTED Final   Enterobacter cloacae complex NOT DETECTED NOT DETECTED Final   Escherichia coli NOT DETECTED NOT DETECTED Final   Klebsiella aerogenes NOT DETECTED NOT DETECTED Final   Klebsiella oxytoca NOT DETECTED NOT DETECTED Final   Klebsiella pneumoniae NOT DETECTED NOT DETECTED Final   Proteus species NOT DETECTED NOT DETECTED Final   Salmonella species NOT DETECTED NOT DETECTED Final   Serratia marcescens NOT DETECTED NOT DETECTED Final   Haemophilus influenzae NOT DETECTED NOT DETECTED Final   Neisseria meningitidis NOT DETECTED NOT DETECTED Final   Pseudomonas aeruginosa NOT DETECTED NOT DETECTED Final   Stenotrophomonas maltophilia NOT DETECTED NOT DETECTED Final   Candida albicans NOT DETECTED NOT DETECTED Final   Candida auris NOT DETECTED NOT DETECTED Final   Candida glabrata NOT DETECTED NOT DETECTED Final   Candida krusei NOT DETECTED NOT DETECTED Final   Candida parapsilosis NOT DETECTED NOT DETECTED Final   Candida tropicalis NOT DETECTED NOT DETECTED Final   Cryptococcus neoformans/gattii NOT DETECTED NOT DETECTED Final    Comment: Performed at [facility], [place] [address] [place], [place] [address]  Urine Culture (for pregnant, neutropenic or urologic patients or patients with an indwelling urinary catheter)     Status: None   Collection Time: 09/17/22  9:20 PM   Specimen: Urine, Clean Catch  Result Value Ref Range Status   Specimen Description   Final    URINE, CLEAN CATCH Performed at [facility], [address],  [place], [place] [address]    Special Requests   Final    Normal Performed at [facility], [address], [place], [place] [address]    Culture   Final    NO GROWTH Performed at [facility], [place] [address], [place], [place] [address]    Report Status 09/19/2022 FINAL  Final  Culture, blood (Routine x 2)     Status: Abnormal   Collection Time: 09/17/22  9:40 PM   Specimen: Left Antecubital; Blood  Result Value Ref Range Status   Specimen Description   Final    LEFT ANTECUBITAL Performed at [facility], [address] [place], [place] [address]    Special Requests   Final    BOTTLES DRAWN AEROBIC AND ANAEROBIC Blood Culture adequate volume Performed at [facility], [address], [place], [place] [address]    Culture  Setup Time   Final    AEROBIC BOTTLE ONLY GRAM POSITIVE COCCI CRITICAL VALUE NOTED.  VALUE IS CONSISTENT WITH PREVIOUSLY REPORTED AND CALLED VALUE. ANAEROBIC BOTTLE GRAM POSITIVE COCCI    Culture STAPHYLOCOCCUS CAPITIS (A)  Final   Report Status 09/21/2022 FINAL  Final   Organism ID, Bacteria STAPHYLOCOCCUS CAPITIS  Final      Susceptibility   Staphylococcus capitis - MIC*    CIPROFLOXACIN <=0.5 SENSITIVE Sensitive     ERYTHROMYCIN <=0.25 SENSITIVE Sensitive     GENTAMICIN <=0.5 SENSITIVE Sensitive     OXACILLIN <=0.25 SENSITIVE Sensitive     TETRACYCLINE <=1 SENSITIVE Sensitive     VANCOMYCIN <=0.5 SENSITIVE Sensitive     TRIMETH/SULFA <=10 SENSITIVE Sensitive     CLINDAMYCIN <=0.25 SENSITIVE Sensitive     RIFAMPIN <=0.5 SENSITIVE Sensitive     Inducible Clindamycin NEGATIVE Sensitive     * STAPHYLOCOCCUS CAPITIS  Resp panel by RT-PCR (RSV, Flu A&B, Covid) Anterior Nasal Swab     Status: None   Collection Time: 09/17/22 10:53 PM   Specimen: Anterior Nasal Swab  Result Value Ref Range Status  SARS Coronavirus 2 by RT PCR NEGATIVE NEGATIVE Final    Comment: (NOTE) SARS-CoV-2 target nucleic acids are NOT DETECTED.  The SARS-CoV-2 RNA is generally  detectable in upper respiratory specimens during the acute phase of infection. The lowest concentration of SARS-CoV-2 viral copies this assay can detect is 138 copies/mL. A negative result does not preclude SARS-Cov-2 infection and should not be used as the sole basis for treatment or other patient management decisions. A negative result may occur with  improper specimen collection/handling, submission of specimen other than nasopharyngeal swab, presence of viral mutation(s) within the areas targeted by this assay, and inadequate number of viral copies(<138 copies/mL). A negative result must be combined with clinical observations, patient history, and epidemiological information. The expected result is Negative.  Fact Sheet for Patients:  EntrepreneurPulse.com.au  Fact Sheet for Healthcare Providers:  IncredibleEmployment.be  This test is no t yet approved or cleared by the [place] FDA and  has been authorized for detection and/or diagnosis of SARS-CoV-2 by FDA under an Emergency Use Authorization (EUA). This EUA will remain  in effect (meaning this test can be used) for the duration of the COVID-19 declaration under Section 564(b)(1) of the Act, 21 U.S.C.section 360bbb-3(b)(1), unless the authorization is terminated  or revoked sooner.       Influenza A by PCR NEGATIVE NEGATIVE Final   Influenza B by PCR NEGATIVE NEGATIVE Final    Comment: (NOTE) The Xpert Xpress SARS-CoV-2/FLU/RSV plus assay is intended as an aid in the diagnosis of influenza from Nasopharyngeal swab specimens and should not be used as a sole basis for treatment. Nasal washings and aspirates are unacceptable for Xpert Xpress SARS-CoV-2/FLU/RSV testing.  Fact Sheet for Patients: EntrepreneurPulse.com.au  Fact Sheet for Healthcare Providers: IncredibleEmployment.be  This test is not yet approved or cleared by the [place] FDA  and has been authorized for detection and/or diagnosis of SARS-CoV-2 by FDA under an Emergency Use Authorization (EUA). This EUA will remain in effect (meaning this test can be used) for the duration of the COVID-19 declaration under Section 564(b)(1) of the Act, 21 U.S.C. section 360bbb-3(b)(1), unless the authorization is terminated or revoked.     Resp Syncytial Virus by PCR NEGATIVE NEGATIVE Final    Comment: (NOTE) Fact Sheet for Patients: EntrepreneurPulse.com.au  Fact Sheet for Healthcare Providers: IncredibleEmployment.be  This test is not yet approved or cleared by the [place] FDA and has been authorized for detection and/or diagnosis of SARS-CoV-2 by FDA under an Emergency Use Authorization (EUA). This EUA will remain in effect (meaning this test can be used) for the duration of the COVID-19 declaration under Section 564(b)(1) of the Act, 21 U.S.C. section 360bbb-3(b)(1), unless the authorization is terminated or revoked.  Performed at [facility], [address] [place], [place] [address]   Culture, body fluid w Gram Stain-bottle     Status: None   Collection Time: 09/18/22  5:00 AM   Specimen: Peritoneal Washings  Result Value Ref Range Status   Specimen Description PERITONEAL  Final   Special Requests BOTTLES DRAWN AEROBIC AND ANAEROBIC 10CC  Final   Culture   Final    NO GROWTH 5 DAYS Performed at [facility], [address] [place], [place] [address]    Report Status 09/23/2022 FINAL  Final  Gram stain     Status: None   Collection Time: 09/18/22  5:00 AM   Specimen: Peritoneal Washings  Result Value Ref Range Status   Specimen Description PERITONEAL  Final  Special Requests NONE  Final   Gram Stain   Final    CYTOSPIN SMEAR NO ORGANISMS SEEN WBC PRESENT,BOTH PMN AND MONONUCLEAR Performed at [facility], [address] [place], [place] [address]    Report Status 09/18/2022 FINAL  Final  Culture, blood  (Routine X 2) w Reflex to ID Panel     Status: None (Preliminary result)   Collection Time: 09/19/22 12:38 AM   Specimen: BLOOD  Result Value Ref Range Status   Specimen Description BLOOD BLOOD RIGHT WRIST  Final   Special Requests   Final    BOTTLES DRAWN AEROBIC AND ANAEROBIC Blood Culture adequate volume   Culture   Final    NO GROWTH 4 DAYS Performed at [facility], [address], [place], [place] [address]    Report Status PENDING  Incomplete  Culture, blood (Routine X 2) w Reflex to ID Panel     Status: None (Preliminary result)   Collection Time: 09/19/22 12:40 AM   Specimen: BLOOD  Result Value Ref Range Status   Specimen Description BLOOD BLOOD RIGHT HAND  Final   Special Requests   Final    BOTTLES DRAWN AEROBIC AND ANAEROBIC Blood Culture adequate volume   Culture   Final    NO GROWTH 4 DAYS Performed at [facility], [address] [place], [place] [address]    Report Status PENDING  Incomplete  Culture, blood (single) w Reflex to ID Panel     Status: None (Preliminary result)   Collection Time: 09/22/22  8:49 AM   Specimen: Right Antecubital; Blood  Result Value Ref Range Status   Specimen Description RIGHT ANTECUBITAL  Final   Special Requests   Final    BOTTLES DRAWN AEROBIC AND ANAEROBIC Blood Culture results may not be optimal due to an excessive volume of blood received in culture bottles   Culture   Final    NO GROWTH < 24 HOURS Performed at [facility], [address], [place], [place] [address]    Report Status PENDING  Incomplete  Culture, body fluid w Gram Stain-bottle     Status: None (Preliminary result)   Collection Time: 09/22/22 10:31 AM   Specimen: Peritoneal Washings  Result Value Ref Range Status   Specimen Description PERITONEAL BOTTLES DRAWN AEROBIC AND ANAEROBIC  Final   Special Requests Blood Culture adequate volume  Final   Culture   Final    NO GROWTH < 24 HOURS Performed at [facility], [address], [place], [place] [address]     Report Status PENDING  Incomplete  Gram stain     Status: None   Collection Time: 09/22/22 10:31 AM   Specimen: Peritoneal Washings  Result Value Ref Range Status   Specimen Description PERITONEAL  Final   Special Requests NONE  Final   Gram Stain   Final    NO ORGANISMS SEEN WBC PRESENT,BOTH PMN AND MONONUCLEAR CYTOSPIN SMEAR Performed at [facility], [address] [place], [place] [address]    Report Status 09/22/2022 FINAL  Final  Surgical pcr screen     Status: None   Collection Time: 09/23/22  5:18 AM   Specimen: Nasal Mucosa; Nasal Swab  Result Value Ref Range Status   MRSA, PCR NEGATIVE NEGATIVE Final   Staphylococcus aureus NEGATIVE NEGATIVE Final    Comment: (NOTE) The Xpert SA Assay (FDA approved for NASAL specimens in patients 36 years of age and older), is one component of a comprehensive surveillance program. It is not intended  to diagnose infection nor to guide or monitor treatment. Performed at [facility], [address], [place], [place] [address]     Radiology Reports No results found.  SIGNED: Deatra James, MD, FHM. FAAFP. Zacarias Pontes - [facility] Time spent > 55 min.  In seeing, evaluating and examining the patient. Reviewing medical records, labs, drawn plan of care. [facility],  Pager (please use [email or web address] to page/ text) Please use Epic Secure Chat for non-urgent communication (7AM-7PM)  If 7PM-7AM, please contact night-coverage [email or web address], 09/23/2022, 12:04 PM

## 2022-09-23 NOTE — Progress Notes (Signed)
*  PRELIMINARY RESULTS* Echocardiogram Echocardiogram Transesophageal has been performed.  Rachel Huber 09/23/2022, 11:30 AM

## 2022-09-23 NOTE — Progress Notes (Signed)
Peripherally Inserted Central Catheter Placement  The IV Nurse has discussed with the patient and/or persons authorized to consent for the patient, the purpose of this procedure and the potential benefits and risks involved with this procedure.  The benefits include less needle sticks, lab draws from the catheter, and the patient may be discharged home with the catheter. Risks include, but not limited to, infection, bleeding, blood clot (thrombus formation), and puncture of an artery; nerve damage and irregular heartbeat and possibility to perform a PICC exchange if needed/ordered by physician.  Alternatives to this procedure were also discussed.  Bard Power PICC patient education guide, fact sheet on infection prevention and patient information card has been provided to patient /or left at bedside.    PICC Placement Documentation  PICC Single Lumen 28/36/62 Left Basilic 46 cm 0 cm (Active)  Indication for Insertion or Continuance of Line Prolonged intravenous therapies 09/23/22 1631  Exposed Catheter (cm) 0 cm 09/23/22 1631  Site Assessment Clean, Dry, Intact 09/23/22 1631  Line Status Flushed;Saline locked;Blood return noted 09/23/22 1631  Dressing Type Transparent;Securing device 09/23/22 1631  Dressing Status Antimicrobial disc in place 09/23/22 1631  Dressing Intervention New dressing;Other (Comment) 09/23/22 1631  Dressing Change Due 09/30/22 09/23/22 1631       Christella Noa Albarece 09/23/2022, 4:33 PM

## 2022-09-23 NOTE — Op Note (Signed)
Patient:  Rachel Huber  DOB:  11-Dec-1955  MRN:  [id number]    Preop Diagnosis:  Bacteremia  Postop Diagnosis:  Same  Procedure: Port-A-Cath removal  Surgeon: Aviva Signs, MD  Anes: Local  Indications: Patient is a 67 year old white female with bacteremia.  I have been requested to remove the Port-A-Cath.  The risks and benefits of the procedure were fully explained to the patient, who gave informed consent.  Procedure note: The patient was placed in the supine position.  The procedure was gone in the minor procedure room.  The right upper chest wall was prepped and draped using usual sterile technique with ChloraPrep.  Surgical site confirmation was performed.  1% Xylocaine was used for local anesthesia.  An incision was made through the previous incision site.  The Port-A-Cath was removed in total without difficulty.  The catheter tip was sent to microbiology.  The wound was irrigated with normal saline.  No abscess was found.  The subcutaneous layer was reapproximated using a 4-0 Monocryl subcuticular suture.  Dermabond was applied.  Patient tolerated the procedure well.  Complications: None  EBL: Minimal  Specimen: Catheter tip for culture

## 2022-09-24 ENCOUNTER — Inpatient Hospital Stay: Payer: 59

## 2022-09-24 ENCOUNTER — Encounter (HOSPITAL_COMMUNITY): Payer: Self-pay | Admitting: General Surgery

## 2022-09-24 DIAGNOSIS — R7881 Bacteremia: Secondary | ICD-10-CM | POA: Diagnosis not present

## 2022-09-24 LAB — CBC
HCT: 25.3 % — ABNORMAL LOW (ref 36.0–46.0)
Hemoglobin: 7.9 g/dL — ABNORMAL LOW (ref 12.0–15.0)
MCH: 25.5 pg — ABNORMAL LOW (ref 26.0–34.0)
MCHC: 31.2 g/dL (ref 30.0–36.0)
MCV: 81.6 fL (ref 80.0–100.0)
Platelets: 112 10*3/uL — ABNORMAL LOW (ref 150–400)
RBC: 3.1 MIL/uL — ABNORMAL LOW (ref 3.87–5.11)
RDW: 22.9 % — ABNORMAL HIGH (ref 11.5–15.5)
WBC: 3.6 10*3/uL — ABNORMAL LOW (ref 4.0–10.5)
nRBC: 0 % (ref 0.0–0.2)

## 2022-09-24 LAB — BASIC METABOLIC PANEL
Anion gap: 7 (ref 5–15)
BUN: 31 mg/dL — ABNORMAL HIGH (ref 8–23)
CO2: 22 mmol/L (ref 22–32)
Calcium: 7.7 mg/dL — ABNORMAL LOW (ref 8.9–10.3)
Chloride: 107 mmol/L (ref 98–111)
Creatinine, Ser: 1.41 mg/dL — ABNORMAL HIGH (ref 0.44–1.00)
GFR, Estimated: 42 mL/min — ABNORMAL LOW (ref >60)
Glucose, Bld: 136 mg/dL — ABNORMAL HIGH (ref 70–99)
Potassium: 3.3 mmol/L — ABNORMAL LOW (ref 3.5–5.1)
Sodium: 136 mmol/L (ref 135–145)

## 2022-09-24 LAB — GLUCOSE, CAPILLARY: Glucose-Capillary: 160 mg/dL — ABNORMAL HIGH (ref 70–99)

## 2022-09-24 LAB — CYTOLOGY - NON PAP

## 2022-09-24 MED ORDER — CEFAZOLIN IV (FOR PTA / DISCHARGE USE ONLY): 2.0000 g | Freq: Three times a day (TID) | INTRAVENOUS | 0 refills | Status: AC

## 2022-09-24 MED ORDER — POTASSIUM CHLORIDE CRYS ER 20 MEQ PO TBCR
40.0000 meq | EXTENDED_RELEASE_TABLET | Freq: Once | ORAL | Status: AC
  Administered 2022-09-24: 40 meq via ORAL
  Filled 2022-09-24: qty 2

## 2022-09-24 MED ORDER — RISAQUAD PO CAPS: 2.0000 | ORAL_CAPSULE | Freq: Three times a day (TID) | ORAL | 0 refills | Status: DC

## 2022-09-24 MED ORDER — MIDODRINE HCL 10 MG PO TABS: 10.0000 mg | ORAL_TABLET | Freq: Three times a day (TID) | ORAL | 0 refills | Status: DC

## 2022-09-24 NOTE — Progress Notes (Signed)
1 Day Post-Op  Subjective: Patient has no complaints.  Objective: Vital signs in last 24 hours: Temp:  [97.8 F (36.6 C)-98.4 F (36.9 C)] 97.8 F (36.6 C) (02/07 0842) Pulse Rate:  [66-86] 74 (02/07 0842) Resp:  [12-20] 14 (02/07 0543) BP: (107-133)/(42-58) 111/42 (02/07 0842) SpO2:  [94 %-100 %] 98 % (02/07 0842) Weight:  [132.9 kg] 132.9 kg (02/07 0543) Last BM Date : 09/22/22  Intake/Output from previous day: 02/06 0701 - 02/07 0700 In: 1573.9 [P.O.:780; I.V.:400; IV Piggyback:393.9] Out: 100 [Urine:100] Intake/Output this shift: No intake/output data recorded.  General appearance: alert, cooperative, and no distress Chest wall: Former Port-A-Cath site healing well.  No ecchymosis or seroma.  Lab Results:  Recent Labs    09/23/22 0423 09/24/22 0500  WBC 5.0 3.6*  HGB 8.1* 7.9*  HCT 26.2* 25.3*  PLT 129* 112*   BMET Recent Labs    09/23/22 0423 09/24/22 0500  NA 136 136  K 3.8 3.3*  CL 109 107  CO2 22 22  GLUCOSE 166* 136*  BUN 32* 31*  CREATININE 1.45* 1.41*  CALCIUM 7.8* 7.7*   PT/INR No results for input(s): "LABPROT", "INR" in the last 72 hours.  Studies/Results: ECHO TEE  Result Date: 09/23/2022    TRANSESOPHOGEAL ECHO REPORT   Patient Name:   Rachel Huber Date of Exam: 09/23/2022 Medical Rec #:  [id number]      Height:       68.0 in Accession #:    [id number]     Weight:       315.9 lb Date of Birth:  Mar 09, 1956       BSA:          2.482 m Patient Age:    67 years       BP:           118/49 mmHg Patient Gender: F              HR:           78 bpm. Exam Location:  [facility] Procedure: Transesophageal Echo, Cardiac Doppler and Color Doppler Indications:    Bacteremia R78.81  History:        Patient has prior history of Echocardiogram examinations, most                 recent 10/11/2022. Risk Factors:Hypertension, Diabetes and                 Dyslipidemia. Thalassemia minor, anemia in CKD, cirrhosis NASH.                 OSA on CPAP.  Sonographer:     Alvino Chapel RCS Referring Phys: [phone] [place]: The transesophogeal probe was passed without difficulty through the esophogus of the patient. Sedation performed by different physician. Image quality was excellent. The patient developed no complications during the procedure.  IMPRESSIONS  1. Left ventricular ejection fraction, by estimation, is 60 to 65%. The left ventricle has normal function.  2. Right ventricular systolic function is normal. The right ventricular size is normal.  3. Left atrial size was mildly dilated. No left atrial/left atrial appendage thrombus was detected. The LAA emptying velocity was 82 cm/s.  4. Right atrial size was mildly dilated.  5. Moderate mitral regurgitation, MR vena contracta is 0.4 cm. . The mitral valve is abnormal. Mild to moderate mitral valve regurgitation. No evidence of mitral stenosis.  6. The tricuspid valve is abnormal.  7. The aortic valve  is tricuspid. Aortic valve regurgitation is not visualized. No aortic stenosis is present. Conclusion(s)/Recommendation(s): No evidence of vegetation/infective endocarditis on this transesophageael echocardiogram. FINDINGS  Left Ventricle: Left ventricular ejection fraction, by estimation, is 60 to 65%. The left ventricle has normal function. The left ventricular internal cavity size was normal in size. Right Ventricle: The right ventricular size is normal. No increase in right ventricular wall thickness. Right ventricular systolic function is normal. Left Atrium: Left atrial size was mildly dilated. No left atrial/left atrial appendage thrombus was detected. The LAA emptying velocity was 82 cm/s. Right Atrium: Right atrial size was mildly dilated. Pericardium: There is no evidence of pericardial effusion. Mitral Valve: Moderate mitral regurgitation, MR vena contracta is 0.4 cm. The mitral valve is abnormal. Mild to moderate mitral valve regurgitation. No evidence of mitral valve stenosis. Tricuspid Valve: The tricuspid  valve is abnormal. Tricuspid valve regurgitation is mild . No evidence of tricuspid stenosis. Aortic Valve: The aortic valve is tricuspid. Aortic valve regurgitation is not visualized. No aortic stenosis is present. Pulmonic Valve: The pulmonic valve was normal in structure. Pulmonic valve regurgitation is not visualized. No evidence of pulmonic stenosis. Aorta: The aortic root is normal in size and structure. IAS/Shunts: No atrial level shunt detected by color flow Doppler.   AORTA Ao Root diam: 3.30 cm Carlyle Dolly MD Electronically signed by Carlyle Dolly MD Signature Date/Time: 09/23/2022/12:06:40 PM    Final    US Paracentesis  Result Date: 09/22/2022 INDICATION: Cirrhosis, ascites EXAM: ULTRASOUND GUIDED DIAGNOSTIC AND THERAPEUTIC PARACENTESIS MEDICATIONS: None. COMPLICATIONS: None immediate. PROCEDURE: Informed written consent was obtained from the patient after a discussion of the risks, benefits and alternatives to treatment. A timeout was performed prior to the initiation of the procedure. Initial ultrasound scanning demonstrates a large amount of ascites within the LEFT lower abdominal quadrant. The right lower abdomen was prepped and draped in the usual sterile fashion. 1% lidocaine was used for local anesthesia. Following this, a 19 gauge, 15-cm, Yueh catheter was introduced. An ultrasound image was saved for documentation purposes. The paracentesis was performed. The catheter was removed and a dressing was applied. The patient tolerated the procedure well without immediate post procedural complication. FINDINGS: A total of approximately 7 L of yellow ascitic fluid was removed. Samples were sent to the laboratory as requested by the clinical team. IMPRESSION: Successful ultrasound-guided paracentesis yielding 7 liters of peritoneal fluid. Electronically Signed   By: Lavonia Dana M.D.   On: 09/22/2022 12:14   [place] EKG SITE RITE  Result Date: 09/22/2022 If Site Rite image not attached, placement  could not be confirmed due to current cardiac rhythm.   Anti-infectives: Anti-infectives (From admission, onward)    Start     Dose/Rate Route Frequency Ordered Stop   09/21/22 1400  ceFAZolin (ANCEF) IVPB 2g/100 mL premix        2 g 200 mL/hr over 30 Minutes Intravenous Every 8 hours 09/21/22 1029     09/21/22 0800  ciprofloxacin (CIPRO) tablet 500 mg        500 mg Oral Daily with breakfast 09/20/22 1629     09/19/22 2030  ceFEPIme (MAXIPIME) 2 g in sodium chloride 0.9 % 100 mL IVPB  Status:  Discontinued        2 g 200 mL/hr over 30 Minutes Intravenous Every 12 hours 09/19/22 0916 09/20/22 1629   09/19/22 0000  vancomycin (VANCOREADY) IVPB 1250 mg/250 mL  Status:  Discontinued        1,250 mg 166.7 mL/hr  over 90 Minutes Intravenous Every 24 hours 09/17/22 2335 09/18/22 0740   09/19/22 0000  vancomycin (VANCOREADY) IVPB 1750 mg/350 mL  Status:  Discontinued        1,750 mg 175 mL/hr over 120 Minutes Intravenous Every 24 hours 09/18/22 0755 09/21/22 1029   09/18/22 1200  metroNIDAZOLE (FLAGYL) IVPB 500 mg  Status:  Discontinued        500 mg 100 mL/hr over 60 Minutes Intravenous Every 12 hours 09/18/22 0740 09/20/22 0745   09/18/22 0800  ceFEPIme (MAXIPIME) 2 g in sodium chloride 0.9 % 100 mL IVPB  Status:  Discontinued        2 g 200 mL/hr over 30 Minutes Intravenous Every 8 hours 09/17/22 2335 09/18/22 0740   09/18/22 0800  ceFEPIme (MAXIPIME) 2 g in sodium chloride 0.9 % 100 mL IVPB  Status:  Discontinued        2 g 200 mL/hr over 30 Minutes Intravenous  Once 09/18/22 0740 09/18/22 0750   09/18/22 0800  ceFEPIme (MAXIPIME) 2 g in sodium chloride 0.9 % 100 mL IVPB  Status:  Discontinued        2 g 200 mL/hr over 30 Minutes Intravenous Every 8 hours 09/18/22 0750 09/19/22 0916   09/18/22 0745  vancomycin (VANCOCIN) IVPB 1000 mg/200 mL premix  Status:  Discontinued        1,000 mg 200 mL/hr over 60 Minutes Intravenous  Once 09/18/22 0740 09/18/22 0747   09/17/22 2345  vancomycin  (VANCOCIN) IVPB 1000 mg/200 mL premix        1,000 mg 200 mL/hr over 60 Minutes Intravenous  Once 09/17/22 2335 09/18/22 0341   09/17/22 2300  ceFEPIme (MAXIPIME) 2 g in sodium chloride 0.9 % 100 mL IVPB        2 g 200 mL/hr over 30 Minutes Intravenous  Once 09/17/22 2255 09/17/22 2347   09/17/22 2300  metroNIDAZOLE (FLAGYL) IVPB 500 mg        500 mg 100 mL/hr over 60 Minutes Intravenous  Once 09/17/22 2255 09/18/22 0051   09/17/22 2300  vancomycin (VANCOCIN) IVPB 1000 mg/200 mL premix        1,000 mg 200 mL/hr over 60 Minutes Intravenous  Once 09/17/22 2255 09/18/22 0202       Assessment/Plan: Impression: Doing well after Port-A-Cath removal.  Initial Gram stain of catheter tip negative for white blood cells or organisms.  Will sign off.  Please call me if I can be of further assistance.  LOS: 6 days    Aviva Signs 09/24/2022

## 2022-09-24 NOTE — Progress Notes (Signed)
OT Cancellation Note  Patient Details Name: Rachel Huber MRN: [id number] DOB: 11-03-1955   Cancelled Treatment:    Reason Eval/Treat Not Completed: Patient declined, no reason specified. Pt reported she was hopefully going home today and wanted to focus her energy on that. Will attempt to see pt at a later time if she is still here.   Eliyanna Ault OT, MOT   Larey Seat 09/24/2022, 10:30 AM

## 2022-09-24 NOTE — Progress Notes (Addendum)
Mobility Specialist Progress Note:    09/24/22 1500  Mobility  Activity Ambulated with assistance in hallway  Level of Assistance Minimal assist, patient does 75% or more  Assistive Device Front wheel walker  Distance Ambulated (ft) 150 ft  Activity Response Tolerated well  Mobility Referral Yes  $Mobility charge 1 Mobility   Pt was agreeable for mobility session. MinA required to stand d/t leg tightness, MingG while ambulating. Tolerated well, asx throughout. Left pt in chair with husband in room, all needs met.   Royetta Crochet Mobility Specialist Please contact via Solicitor or  Rehab office at [phone]

## 2022-09-24 NOTE — Discharge Summary (Signed)
Physician Discharge Summary  Rachel Huber PJK:[id number] DOB: 1956-06-04 DOA: 09/17/2022  PCP: [name], MD  Admit date: 09/17/2022  Discharge date: 09/24/2022  Admitted From:Home  Disposition:  Home  Recommendations for Outpatient Follow-up:  Follow up with PCP in 1-2 weeks Continue IV cefazolin for 13 more days as prescribed for treatment of Staph capitis bacteremia Continue home medications as noted below  Home Health: Yes with home health RN  Equipment/Devices: None  Discharge Condition:Stable  CODE STATUS: Full  Diet recommendation: Heart Healthy  Brief/Interim Summary:  Rachel Huber is a 67 year old female with significant history of beta thalassemia minor (status post first treatment with IV Retacrit,on 1/31), aslo H/o cirrhosis NASH,with paracentesis 1/30, CKD 3B, HLD, GERD, internal hemorrhoids, OSA-CPAP, anxiety/depression, chronic anemia -with multiple PRBC transfusion, chronic hemorrhoid with bleeds, and more who recently received a dose of IV Retacrit through her Port-A-Cath at the oncology office for beta thalassemia on 1/31.  She was admitted due to concern for SIRS criteria and was eventually noted to have a bacteremia with Staph capitis that required Port-A-Cath removal on 2/6 as well as TEE that was performed the same day.  She has been seen by ID with recommendations to continue treatment with cefazolin for total 14 days and has had PICC line placement on 2/6 as well.  She also has history of alcoholic liver cirrhosis with ascites and underwent 7 L fluid removal on 2/5.  No other acute events or concerns noted and she is now in stable condition for discharge to continue on home IV antibiotics as ordered.  Discharge Diagnoses:  Principal Problem:   Bacteremia Active Problems:   Acute on chronic blood loss anemia   SIRS (systemic inflammatory response syndrome) (HCC)   Acute kidney injury superimposed on CKD ([place])   Depression with anxiety   HLD  (hyperlipidemia)   Thrombocytopenia (HCC)   Chronic kidney disease, stage 3b (HCC)   Thalassemia minor   Type 2 diabetes mellitus with hyperlipidemia (HCC)   OSA on CPAP   Obesity (BMI 30-39.9)   Sepsis ([place])   Alcoholic cirrhosis of liver with ascites ([place])  Principal discharge diagnosis: Sepsis, present on admission, secondary to Staph capitis bacteremia status post removal of Port-A-Cath on 2/6.  AKI on CKD stage IIIb.  Thalassemia minor.  Discharge Instructions  Discharge Instructions     [facility] pharmacist to adjust dose for Vancomycin, Aminoglycosides and other anti-infective therapies as requested by physician.   Complete by: As directed    [facility] to provide Cath Flo '2mg'$    Complete by: As directed    Administer for PICC line occlusion and as ordered by physician for other access device issues.   Ambulatory referral to Physical Therapy   Complete by: As directed    Anaphylaxis Kit: Provided to treat any anaphylactic reaction to the medication being provided to the patient if First Dose or when requested by physician   Complete by: As directed    Epinephrine '1mg'$ /ml vial / amp: Administer 0.'3mg'$  (0.51m) subcutaneously once for moderate to severe anaphylaxis, nurse to call physician and pharmacy when reaction occurs and call 911 if needed for immediate care   Diphenhydramine '50mg'$ /ml IV vial: Administer 25-'50mg'$  IV/IM PRN for first dose reaction, rash, itching, mild reaction, nurse to call physician and pharmacy when reaction occurs   Sodium Chloride 0.9% NS 5045mIV: Administer if needed for hypovolemic blood pressure drop or as ordered by physician after call to physician with anaphylactic reaction  Change dressing on IV access line weekly and PRN   Complete by: As directed    Diet - low sodium heart healthy   Complete by: As directed    Flush IV access with Sodium Chloride 0.9% and Heparin 10 units/ml or 100 units/ml   Complete by: As directed     Home infusion instructions - [facility]   Complete by: As directed    Instructions: Flush IV access with Sodium Chloride 0.9% and Heparin 10units/ml or 100units/ml   Change dressing on IV access line: Weekly and PRN   Instructions Cath Flo '2mg'$ : Administer for PICC Line occlusion and as ordered by physician for other access device   [facility] pharmacist to adjust dose for: Vancomycin, Aminoglycosides and other anti-infective therapies as requested by physician   If the dressing is still on your incision site when you go home, remove it on the third day after your surgery date. Remove dressing if it begins to fall off, or if it is dirty or damaged before the third day.   Complete by: As directed    Increase activity slowly   Complete by: As directed    Method of administration may be changed at the discretion of home infusion pharmacist based upon assessment of the patient and/or caregiver's ability to self-administer the medication ordered   Complete by: As directed       Allergies as of 09/24/2022       Reactions   Gramineae Pollens Other (See Comments)   Sneezing, Running nose   Latex Rash   Contact rash        Medication List     TAKE these medications    acetaminophen 650 MG CR tablet Commonly known as: TYLENOL Take 1,300 mg by mouth daily as needed for pain.   acidophilus Caps capsule Take 2 capsules by mouth 3 (three) times daily.   albuterol 108 (90 Base) MCG/ACT inhaler Commonly known as: VENTOLIN HFA Inhale 2 puffs into the lungs every 6 (six) hours as needed for wheezing or shortness of breath.   atorvastatin 10 MG tablet Commonly known as: LIPITOR Take 1 tablet (10 mg total) by mouth daily.   ceFAZolin  IVPB Commonly known as: ANCEF Inject 2 g into the vein every 8 (eight) hours for 12 days. Indication:  Methicillin susceptible S capitis bacteremia with suspected source being portacath First Dose: Yes Last Day of Therapy:   10/06/2022 Labs - Once weekly:  CBC/D and BMP Please leave PIC in place until doctor has seen patient or been notified Fax weekly labs to [phone] Method of administration: IV Push Method of administration may be changed at the discretion of home infusion pharmacist based upon assessment of the patient and/or caregiver's ability to self-administer the medication ordered.   cyanocobalamin 1000 MCG tablet Commonly known as: VITAMIN B12 Take 1,000 mcg by mouth daily.   diphenhydrAMINE 25 MG tablet Commonly known as: BENADRYL Take 25 mg by mouth 2 (two) times daily as needed for itching.   ergocalciferol 1.25 MG (50000 UT) capsule Commonly known as: VITAMIN D2 Take 1 capsule (50,000 Units total) by mouth every 14 (fourteen) days.   furosemide 40 MG tablet Commonly known as: LASIX Take 40 mg by mouth daily.   gabapentin 400 MG capsule Commonly known as: NEURONTIN Take 800 mg by mouth 2 (two) times daily.   hydrocortisone 2.5 % rectal cream Commonly known as: ANUSOL-HC Place rectally 4 (four) times daily.   hydrocortisone 25 MG suppository  Commonly known as: Anucort-HC Place 1 suppository (25 mg total) rectally 2 (two) times daily as needed for anal itching or hemorrhoids.   lactulose (encephalopathy) 10 GM/15ML Soln Commonly known as: CHRONULAC Take 30 mLs (20 g total) by mouth daily.   lidocaine-prilocaine cream Commonly known as: EMLA Apply 1 Application topically See admin instructions for 1 dose. Apply to port site 1 hour prior to procedure   melatonin 5 MG Tabs Take 10 mg by mouth at bedtime as needed (sleep).   midodrine 10 MG tablet Commonly known as: PROAMATINE Take 1 tablet (10 mg total) by mouth 3 (three) times daily with meals.   multivitamin with minerals tablet Take 1 tablet by mouth daily.   polyethylene glycol 17 g packet Commonly known as: MIRALAX / GLYCOLAX Take 17 g by mouth 2 (two) times daily.   sertraline 50 MG tablet Commonly known as:  ZOLOFT Take 50 mg by mouth daily.   witch hazel-glycerin pad Commonly known as: TUCKS Apply topically as needed for hemorrhoids.               Discharge Care Instructions  (From admission, onward)           Start     Ordered   09/24/22 0000  Change dressing on IV access line weekly and PRN  (Home infusion instructions - [facility] )        09/24/22 1036   09/24/22 0000  If the dressing is still on your incision site when you go home, remove it on the third day after your surgery date. Remove dressing if it begins to fall off, or if it is dirty or damaged before the third day.        09/24/22 1059            Follow-up Information     [name], MD. Schedule an appointment as soon as possible for a visit in 2 week(s).   Specialty: Internal Medicine Contact information: [place] [id number] [phone]                Allergies  Allergen Reactions   Gramineae Pollens Other (See Comments)    Sneezing, Running nose   Latex Rash    Contact rash    Consultations: ID General surgery Cardiology for TEE Hematology/oncology   Procedures/Studies: ECHO TEE  Result Date: 09/23/2022    TRANSESOPHOGEAL ECHO REPORT   Patient Name:   Rachel Huber Date of Exam: 09/23/2022 Medical Rec #:  [id number]      Height:       68.0 in Accession #:    [id number]     Weight:       315.9 lb Date of Birth:  07-14-1956       BSA:          2.482 m Patient Age:    63 years       BP:           118/49 mmHg Patient Gender: F              HR:           78 bpm. Exam Location:  [facility] Procedure: Transesophageal Echo, Cardiac Doppler and Color Doppler Indications:    Bacteremia R78.81  History:        Patient has prior history of Echocardiogram examinations, most  recent 10/11/2022. Risk Factors:Hypertension, Diabetes and                 Dyslipidemia. Thalassemia minor, anemia in CKD, cirrhosis NASH.                  OSA on CPAP.  Sonographer:    Alvino Chapel RCS Referring Phys: [phone] [place]: The transesophogeal probe was passed without difficulty through the esophogus of the patient. Sedation performed by different physician. Image quality was excellent. The patient developed no complications during the procedure.  IMPRESSIONS  1. Left ventricular ejection fraction, by estimation, is 60 to 65%. The left ventricle has normal function.  2. Right ventricular systolic function is normal. The right ventricular size is normal.  3. Left atrial size was mildly dilated. No left atrial/left atrial appendage thrombus was detected. The LAA emptying velocity was 82 cm/s.  4. Right atrial size was mildly dilated.  5. Moderate mitral regurgitation, MR vena contracta is 0.4 cm. . The mitral valve is abnormal. Mild to moderate mitral valve regurgitation. No evidence of mitral stenosis.  6. The tricuspid valve is abnormal.  7. The aortic valve is tricuspid. Aortic valve regurgitation is not visualized. No aortic stenosis is present. Conclusion(s)/Recommendation(s): No evidence of vegetation/infective endocarditis on this transesophageael echocardiogram. FINDINGS  Left Ventricle: Left ventricular ejection fraction, by estimation, is 60 to 65%. The left ventricle has normal function. The left ventricular internal cavity size was normal in size. Right Ventricle: The right ventricular size is normal. No increase in right ventricular wall thickness. Right ventricular systolic function is normal. Left Atrium: Left atrial size was mildly dilated. No left atrial/left atrial appendage thrombus was detected. The LAA emptying velocity was 82 cm/s. Right Atrium: Right atrial size was mildly dilated. Pericardium: There is no evidence of pericardial effusion. Mitral Valve: Moderate mitral regurgitation, MR vena contracta is 0.4 cm. The mitral valve is abnormal. Mild to moderate mitral valve regurgitation. No evidence of mitral valve  stenosis. Tricuspid Valve: The tricuspid valve is abnormal. Tricuspid valve regurgitation is mild . No evidence of tricuspid stenosis. Aortic Valve: The aortic valve is tricuspid. Aortic valve regurgitation is not visualized. No aortic stenosis is present. Pulmonic Valve: The pulmonic valve was normal in structure. Pulmonic valve regurgitation is not visualized. No evidence of pulmonic stenosis. Aorta: The aortic root is normal in size and structure. IAS/Shunts: No atrial level shunt detected by color flow Doppler.   AORTA Ao Root diam: 3.30 cm Carlyle Dolly MD Electronically signed by Carlyle Dolly MD Signature Date/Time: 09/23/2022/12:06:40 PM    Final    US Paracentesis  Result Date: 09/22/2022 INDICATION: Cirrhosis, ascites EXAM: ULTRASOUND GUIDED DIAGNOSTIC AND THERAPEUTIC PARACENTESIS MEDICATIONS: None. COMPLICATIONS: None immediate. PROCEDURE: Informed written consent was obtained from the patient after a discussion of the risks, benefits and alternatives to treatment. A timeout was performed prior to the initiation of the procedure. Initial ultrasound scanning demonstrates a large amount of ascites within the LEFT lower abdominal quadrant. The right lower abdomen was prepped and draped in the usual sterile fashion. 1% lidocaine was used for local anesthesia. Following this, a 19 gauge, 15-cm, Yueh catheter was introduced. An ultrasound image was saved for documentation purposes. The paracentesis was performed. The catheter was removed and a dressing was applied. The patient tolerated the procedure well without immediate post procedural complication. FINDINGS: A total of approximately 7 L of yellow ascitic fluid was removed. Samples were sent to the laboratory as requested by the clinical team.  IMPRESSION: Successful ultrasound-guided paracentesis yielding 7 liters of peritoneal fluid. Electronically Signed   By: Lavonia Dana M.D.   On: 09/22/2022 12:14   [place] EKG SITE RITE  Result Date: 09/22/2022 If  Site Rite image not attached, placement could not be confirmed due to current cardiac rhythm.  ECHOCARDIOGRAM COMPLETE  Result Date: 09/21/2022    ECHOCARDIOGRAM REPORT   Patient Name:   Rachel Huber Date of Exam: 09/21/2022 Medical Rec #:  [id number]      Height:       68.0 in Accession #:    [id number]     Weight:       300.4 lb Date of Birth:  1956-01-30       BSA:          2.429 m Patient Age:    64 years       BP:           122/59 mmHg Patient Gender: F              HR:           68 bpm. Exam Location:  [facility] Procedure: 2D Echo, Cardiac Doppler and Color Doppler Indications:    Bacteremia  History:        Patient has prior history of Echocardiogram examinations, most                 recent 10/26/2020. Signs/Symptoms:Dyspnea; Risk Factors:Sleep                 Apnea, Hypertension and Diabetes. Thalassemia minor, anemia in                 CKD, cirrhosis NASH.  Sonographer:    Eartha Inch Referring Phys: [id number] [place]ignon [place]  Sonographer Comments: Image acquisition challenging due to patient body habitus and Image acquisition challenging due to respiratory motion. IMPRESSIONS  1. Left ventricular ejection fraction, by estimation, is 65 to 70%. Left ventricular ejection fraction by 2D MOD biplane is 66.9 %. The left ventricle has normal function. The left ventricle has no regional wall motion abnormalities. The left ventricular internal cavity size was mildly dilated. Left ventricular diastolic parameters were normal.  2. Right ventricular systolic function is normal. The right ventricular size is normal. Tricuspid regurgitation signal is inadequate for assessing PA pressure.  3. Left atrial size was moderately dilated.  4. The mitral valve is abnormal. Mild mitral valve regurgitation.  5. The aortic valve is tricuspid. Aortic valve regurgitation is not visualized.  6. The inferior vena cava is dilated in size with <50% respiratory variability, suggesting right atrial pressure of 15 mmHg.  Comparison(s): Changes from prior study are noted. 10/26/2020: LVEF 55-60%. Conclusion(s)/Recommendation(s): No evidence of valvular vegetations on this transthoracic echocardiogram. Consider a transesophageal echocardiogram to exclude infective endocarditis if clinically indicated. FINDINGS  Left Ventricle: Left ventricular ejection fraction, by estimation, is 65 to 70%. Left ventricular ejection fraction by 2D MOD biplane is 66.9 %. The left ventricle has normal function. The left ventricle has no regional wall motion abnormalities. The left ventricular internal cavity size was mildly dilated. There is no left ventricular hypertrophy. Left ventricular diastolic parameters were normal. Right Ventricle: The right ventricular size is normal. No increase in right ventricular wall thickness. Right ventricular systolic function is normal. Tricuspid regurgitation signal is inadequate for assessing PA pressure. Left Atrium: Left atrial size was moderately dilated. Right Atrium: Right atrial size was normal in size. Pericardium: There is no evidence of pericardial  effusion. Mitral Valve: The mitral valve is abnormal. Mild to moderate mitral annular calcification. Mild mitral valve regurgitation. Tricuspid Valve: The tricuspid valve is not well visualized. Tricuspid valve regurgitation is not demonstrated. Aortic Valve: The aortic valve is tricuspid. Aortic valve regurgitation is not visualized. Aortic valve mean gradient measures 8.0 mmHg. Aortic valve peak gradient measures 18.0 mmHg. Aortic valve area, by VTI measures 1.96 cm. Pulmonic Valve: The pulmonic valve was grossly normal. Pulmonic valve regurgitation is trivial. Aorta: The aortic root and ascending aorta are structurally normal, with no evidence of dilitation. Venous: The inferior vena cava is dilated in size with less than 50% respiratory variability, suggesting right atrial pressure of 15 mmHg. IAS/Shunts: No atrial level shunt detected by color flow Doppler.   LEFT VENTRICLE PLAX 2D                        Biplane EF (MOD) LVIDd:         6.00 cm         LV Biplane EF:   Left LVIDs:         3.70 cm                          ventricular LV PW:         1.00 cm                          ejection LV IVS:        0.80 cm                          fraction by LVOT diam:     2.00 cm                          2D MOD LV SV:         90                               biplane is LV SV Index:   37                               66.9 %. LVOT Area:     3.14 cm                                Diastology                                LV e' medial:    6.53 cm/s LV Volumes (MOD)               LV E/e' medial:  17.2 LV vol d, MOD    110.0 ml      LV e' lateral:   10.80 cm/s A2C:                           LV E/e' lateral: 10.4 LV vol d, MOD    164.0 ml A4C: LV vol s, MOD    36.5 ml A2C: LV vol s, MOD    52.9 ml A4C: LV SV MOD  A2C:   73.5 ml LV SV MOD A4C:   164.0 ml LV SV MOD BP:    89.5 ml RIGHT VENTRICLE             IVC RV S prime:     14.70 cm/s  IVC diam: 2.30 cm TAPSE (M-mode): 2.7 cm LEFT ATRIUM              Index        RIGHT ATRIUM           Index LA diam:        5.50 cm  2.26 cm/m   RA Area:     21.20 cm LA Vol (A2C):   114.0 ml 46.92 ml/m  RA Volume:   59.40 ml  24.45 ml/m LA Vol (A4C):   104.0 ml 42.81 ml/m LA Biplane Vol: 112.0 ml 46.10 ml/m  AORTIC VALVE AV Area (Vmax):    1.81 cm AV Area (Vmean):   1.84 cm AV Area (VTI):     1.96 cm AV Vmax:           212.00 cm/s AV Vmean:          138.000 cm/s AV VTI:            0.461 m AV Peak Grad:      18.0 mmHg AV Mean Grad:      8.0 mmHg LVOT Vmax:         122.00 cm/s LVOT Vmean:        80.800 cm/s LVOT VTI:          0.288 m LVOT/AV VTI ratio: 0.62  AORTA Ao Root diam: 3.30 cm Ao Asc diam:  3.20 cm MITRAL VALVE MV Area (PHT): 2.32 cm     SHUNTS MV Decel Time: 327 msec     Systemic VTI:  0.29 m MR Peak grad: 120.6 mmHg    Systemic Diam: 2.00 cm MR Mean grad: 82.0 mmHg MR Vmax:      549.00 cm/s MR Vmean:     420.0 cm/s MV E velocity: 112.00  cm/s MV A velocity: 48.00 cm/s MV E/A ratio:  2.33 Lyman Bishop MD Electronically signed by Lyman Bishop MD Signature Date/Time: 09/21/2022/5:02:02 PM    Final    CT CHEST ABDOMEN PELVIS W CONTRAST  Result Date: 09/18/2022 CLINICAL DATA:  Pneumonia, complication suspected. Sepsis and fever. EXAM: CT CHEST, ABDOMEN, AND PELVIS WITH CONTRAST TECHNIQUE: Multidetector CT imaging of the chest, abdomen and pelvis was performed following the standard protocol during bolus administration of intravenous contrast. RADIATION DOSE REDUCTION: This exam was performed according to the departmental dose-optimization program which includes automated exposure control, adjustment of the mA and/or kV according to patient size and/or use of iterative reconstruction technique. CONTRAST:  142m OMNIPAQUE IOHEXOL 300 MG/ML  SOLN COMPARISON:  02/05/2022. FINDINGS: CT CHEST FINDINGS Cardiovascular: The heart is enlarged and there is no pericardial effusion. The distal tip of a right chest port terminates in the right atrium. There is atherosclerotic calcification of the aorta without evidence of aneurysm. The pulmonary trunk is distended suggesting underlying pulmonary artery hypertension. Mediastinum/Nodes: No mediastinal, hilar, or axillary lymphadenopathy. The thyroid gland, trachea, and esophagus are within normal limits. Lungs/Pleura: Atelectasis is present bilaterally. No effusion or pneumothorax. Musculoskeletal: Shoulder arthroplasty changes are noted on the right. Degenerative changes are present in the thoracic spine. No acute or suspicious osseous abnormality. CT ABDOMEN PELVIS FINDINGS Hepatobiliary: Cirrhotic changes are noted in the liver. No focal abnormality is seen. A tips  stent appear stable in position. A large amount of stones are present within the gallbladder. No biliary ductal dilatation. Pancreas: Unremarkable. No pancreatic ductal dilatation or surrounding inflammatory changes. Spleen: The spleen is enlarged  measuring 16.8 cm in length. No focal abnormality. Adrenals/Urinary Tract: The adrenal glands are within normal limits. No renal calculus or hydronephrosis. The bladder is unremarkable. Stomach/Bowel: There is a small hiatal hernia. Stomach is otherwise within normal limits. Appendix is not seen. No evidence of bowel wall thickening, distention, or inflammatory changes. No free air or pneumatosis. Vascular/Lymphatic: Aortic atherosclerosis. No enlarged abdominal or pelvic lymph nodes. Reproductive: Status post hysterectomy. No adnexal masses. Other: Moderate-to-large ascites. Anasarca is noted. There is an umbilical hernia containing ascites. Musculoskeletal: Degenerative changes are present in the lumbar spine. No acute osseous abnormality. IMPRESSION: 1. No acute process in the chest, abdomen, and pelvis. 2. Morphologic changes of cirrhosis and portal hypertension. 3. Moderate-to-large ascites. 4. Anasarca. 5. Cholelithiasis. 6. Aortic atherosclerosis. 7. Remaining incidental findings as described above. Electronically Signed   By: Brett Fairy M.D.   On: 09/18/2022 02:53   CT Head Wo Contrast  Result Date: 09/18/2022 CLINICAL DATA:  Fever, altered mental status EXAM: CT HEAD WITHOUT CONTRAST TECHNIQUE: Contiguous axial images were obtained from the base of the skull through the vertex without intravenous contrast. RADIATION DOSE REDUCTION: This exam was performed according to the departmental dose-optimization program which includes automated exposure control, adjustment of the mA and/or kV according to patient size and/or use of iterative reconstruction technique. COMPARISON:  09/21/2017 FINDINGS: Brain: No evidence of acute infarction, hemorrhage, hydrocephalus, extra-axial collection or mass lesion/mass effect. Mild subcortical white matter and periventricular small vessel ischemic changes. Vascular: Intracranial atherosclerosis. Skull: Normal. Negative for fracture or focal lesion. Sinuses/Orbits: The  visualized paranasal sinuses are essentially clear. The mastoid air cells are unopacified. Other: None. IMPRESSION: No evidence of acute intracranial abnormality. Mild small vessel ischemic changes. Electronically Signed   By: Julian Hy M.D.   On: 09/18/2022 02:44   DG Chest Port 1 View  Result Date: 09/17/2022 CLINICAL DATA:  Possible sepsis EXAM: PORTABLE CHEST 1 VIEW COMPARISON:  02/08/2022 FINDINGS: Cardiac shadow is mildly enlarged but stable. Right chest wall port is now seen in satisfactory position. Lungs are hypoinflated with crowding of the vascular markings. No focal infiltrate is seen. No bony abnormality is noted. IMPRESSION: No acute abnormality noted. Electronically Signed   By: Inez Catalina M.D.   On: 09/17/2022 21:46   US Paracentesis  Result Date: 09/16/2022 INDICATION: Refractory ascites in patient with history of NASH cirrhosis status post TIPS procedure May 2023 and TIPS revision July 2023 at [facility]. She recently underwent a second TIPS revision 08/20/2022. Request received for therapeutic paracentesis with 8 L max. EXAM: ULTRASOUND GUIDED THERAPEUTIC RIGHT UPPER QUADRANT PARACENTESIS MEDICATIONS: 10 mL 1 % lidocaine COMPLICATIONS: None immediate. PROCEDURE: Informed written consent was obtained from the patient after a discussion of the risks, benefits and alternatives to treatment. A timeout was performed prior to the initiation of the procedure. Initial ultrasound scanning demonstrates a moderate amount of ascites within the right upper abdominal quadrant. The right upper abdomen was prepped and draped in the usual sterile fashion. 1% lidocaine was used for local anesthesia. Following this, a 19 gauge,10-cm, Yueh catheter was introduced. An ultrasound image was saved for documentation purposes. The paracentesis was performed. The catheter was removed and a dressing was applied. The patient tolerated the procedure well without immediate post procedural complication. Patient  received post-procedure intravenous albumin;  see nursing notes for details. FINDINGS: A total of approximately 8 L of clear, yellow fluid was removed. IMPRESSION: Successful ultrasound-guided paracentesis yielding 8 liters of peritoneal fluid. Read by: Narda Rutherford, AGNP-BC Electronically Signed   By: Sandi Mariscal M.D.   On: 09/16/2022 12:39   US Paracentesis  Result Date: 09/09/2022 INDICATION: Cirrhosis with recurrent ascites, s/p TIPS and TIPS revisions EXAM: ULTRASOUND GUIDED LLQ PARACENTESIS MEDICATIONS: None. COMPLICATIONS: None immediate. PROCEDURE: Informed written consent was obtained from the patient after a discussion of the risks, benefits and alternatives to treatment. A timeout was performed prior to the initiation of the procedure. Initial ultrasound scanning demonstrates a large amount of ascites within the left lower abdominal quadrant. The right lower abdomen was prepped and draped in the usual sterile fashion. 1% lidocaine was used for local anesthesia. Following this, a 19 gauge, 10-cm, Yueh catheter was introduced. An ultrasound image was saved for documentation purposes. The paracentesis was performed. The catheter was removed and a dressing was applied. The patient tolerated the procedure well without immediate post procedural complication. FINDINGS: A total of approximately 6L of clear yellow fluid was removed. IMPRESSION: Successful ultrasound-guided paracentesis yielding 6 liters of peritoneal fluid. PLAN: The patient has previously been formally evaluated by the [facility] and is being actively followed for potential future intervention. Read and performed by: Alexandria Lodge, PA-C Electronically Signed   By: Markus Daft M.D.   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 The patient improved initially, however has had recurrent lower gastrointestinal hemorrhage which is episodic and corresponds primarily with abdominal distension prior to paracenteses. While there is likely a multifaceted etiology, she presents for repeat angiogram and possible repeat embolization if there is recanalization of superior rectal artery branches. EXAM: 1. Ultrasound-guided vascular access of the right common femoral artery. 2. Selective catheterization and angiography of the inferior mesenteric and superior rectal arteries 3. Particle and coil embolization of the right superior rectal artery 4. Coil embolization of the left superior rectal artery MEDICATIONS: None. ANESTHESIA/SEDATION: Moderate (conscious) sedation was employed during this procedure. A total of Versed 1 mg and Fentanyl 50 mcg was administered intravenously. Moderate Sedation Time: 60 minutes. The patient's level of consciousness and vital signs were monitored continuously by radiology nursing throughout the procedure under my direct supervision. CONTRAST:  71m OMNIPAQUE IOHEXOL 300 MG/ML SOLN, 631mOMNIPAQUE IOHEXOL 300 MG/ML SOLN FLUOROSCOPY: Radiation Exposure Index (as provided by the fluoroscopic device): 1,9,371Gy Kerma COMPLICATIONS: None immediate. PROCEDURE: Informed consent was obtained from the patient following explanation of the procedure, risks, benefits and alternatives. The patient understands, agrees and consents for the procedure. All questions were addressed. A time out was performed prior to the initiation of the procedure. Maximal barrier sterile technique utilized including caps, mask, sterile gowns, sterile gloves, large sterile drape, hand hygiene, and Betadine prep. Preprocedure ultrasound evaluation demonstrated patency of the right common femoral  artery. The procedure was planned. Subdermal Local anesthesia was administered with 1% lidocaine. A small skin nick was made. Under direct ultrasound visualization, a 21 gauge micropuncture needle was inserted into the right common femoral artery. Micropuncture sheath was introduced and limited right lower extremity angiogram was performed which demonstrated adequate puncture site  for closure device use. A J wire was inserted in the micropuncture sheath was exchanged for a 5 [place] vascular sheath. A Mickelson catheter was then inserted with the reverse curve formed in the descending thoracic aorta. The catheter was then used to select the inferior mesenteric artery. Inferior mesenteric angiogram was then performed. Angiogram was significant for patency of the inferior mesenteric artery and interval cannulization of a duplicated right superior rectal artery with brisk antegrade flow to hemorrhoidal branches. Additionally, there was partial recanalization of the previously embolized left superior rectal artery. The right superior rectal artery remains embolized. A 2.4 French Progreat microcatheter and fathom 14 microwire were then used to select the right superior rectal artery. Particle embolization was then performed with 200 micron Hydropearls. Embolization was performed until flow was sluggish, however not stagnant. Next, a combination of 0.018 inch Terumo Azur microcoils were then deployed in the proximal right duplicated superior rectal artery. Repeat angiogram from the distal superior rectal artery demonstrated complete embolization of the right superior rectal arterial branches. There is persistent partial recanalization of the previously embolized left superior rectal artery. Therefore, the catheter and wire were directed toward the left superior rectal artery ostium and an additional 0.018 inch Terumo Azur microcoil was placed. Completion angiogram demonstrates complete embolization of the bilateral  superior rectal arteries. The catheter wires were removed. The indwelling right common femoral artery sheath was then exchanged for 6 French Angio-Seal was deployed successfully. Pulses were unchanged. The patient tolerated the procedure well was transferred to the recovery area in good condition. IMPRESSION: 1. Superior rectal angiography demonstrates interval recanalization of the duplicated right superior rectal artery and partial recanalization of the previously embolized left superior rectal artery. 2. Technically successful particle and coil embolization of the duplicated right superior rectal artery. Technically successful coil embolization of the left superior rectal artery. PLAN: Follow-up in 1 month in Interventional Radiology clinic. Ruthann Cancer, MD V[facility] and Interventional Radiology Special[facility] Radiology Electronically Signed   By: Ruthann Cancer M.D.   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 The patient improved initially, however has had recurrent lower gastrointestinal hemorrhage which is episodic and corresponds primarily with abdominal distension prior to paracenteses. While there is likely a multifaceted etiology, she presents for repeat angiogram and possible repeat embolization if there is recanalization of superior rectal artery branches. EXAM: 1. Ultrasound-guided vascular access of the right common femoral artery. 2. Selective catheterization and angiography of the inferior mesenteric and superior rectal arteries 3. Particle and coil embolization of the right superior rectal artery 4. Coil embolization of the left superior rectal artery MEDICATIONS:  None. ANESTHESIA/SEDATION: Moderate (conscious) sedation was employed during this procedure. A total of Versed 1 mg and Fentanyl 50 mcg was administered intravenously. Moderate Sedation Time: 60 minutes. The patient's level of consciousness and vital signs were monitored continuously by radiology nursing throughout the procedure under my direct supervision. CONTRAST:  38m OMNIPAQUE IOHEXOL 300 MG/ML SOLN, 652mOMNIPAQUE IOHEXOL 300 MG/ML SOLN FLUOROSCOPY: Radiation Exposure Index (as provided by the fluoroscopic device): 1,0,093Gy Kerma COMPLICATIONS: None immediate. PROCEDURE: Informed consent was obtained from the patient following explanation of the procedure, risks, benefits and alternatives. The patient understands, agrees and consents for the procedure. All  questions were addressed. A time out was performed prior to the initiation of the procedure. Maximal barrier sterile technique utilized including caps, mask, sterile gowns, sterile gloves, large sterile drape, hand hygiene, and Betadine prep. Preprocedure ultrasound evaluation demonstrated patency of the right common femoral artery. The procedure was planned. Subdermal Local anesthesia was administered with 1% lidocaine. A small skin nick was made. Under direct ultrasound visualization, a 21 gauge micropuncture needle was inserted into the right common femoral artery. Micropuncture sheath was introduced and limited right lower extremity angiogram was performed which demonstrated adequate puncture site for closure device use. A J wire was inserted in the micropuncture sheath was exchanged for a 5 [place] vascular sheath. A Mickelson catheter was then inserted with the reverse curve formed in the descending thoracic aorta. The catheter was then used to select the inferior mesenteric artery. Inferior mesenteric angiogram was then performed. Angiogram was significant for patency of the inferior mesenteric artery and interval cannulization of a duplicated right  superior rectal artery with brisk antegrade flow to hemorrhoidal branches. Additionally, there was partial recanalization of the previously embolized left superior rectal artery. The right superior rectal artery remains embolized. A 2.4 French Progreat microcatheter and fathom 14 microwire were then used to select the right superior rectal artery. Particle embolization was then performed with 200 micron Hydropearls. Embolization was performed until flow was sluggish, however not stagnant. Next, a combination of 0.018 inch Terumo Azur microcoils were then deployed in the proximal right duplicated superior rectal artery. Repeat angiogram from the distal superior rectal artery demonstrated complete embolization of the right superior rectal arterial branches. There is persistent partial recanalization of the previously embolized left superior rectal artery. Therefore, the catheter and wire were directed toward the left superior rectal artery ostium and an additional 0.018 inch Terumo Azur microcoil was placed. Completion angiogram demonstrates complete embolization of the bilateral superior rectal arteries. The catheter wires were removed. The indwelling right common femoral artery sheath was then exchanged for 6 French Angio-Seal was deployed successfully. Pulses were unchanged. The patient tolerated the procedure well was transferred to the recovery area in good condition. IMPRESSION: 1. Superior rectal angiography demonstrates interval recanalization of the duplicated right superior rectal artery and partial recanalization of the previously embolized left superior rectal artery. 2. Technically successful particle and coil embolization of the duplicated right superior rectal artery. Technically successful coil embolization of the left superior rectal artery. PLAN: Follow-up in 1 month in Interventional Radiology clinic. Ruthann Cancer, MD Vascular and Interventional Radiology Specialists [facility] Radiology  Electronically Signed   By: Ruthann Cancer M.D.   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 The patient improved initially, however has had recurrent lower gastrointestinal hemorrhage which is episodic and corresponds primarily with abdominal distension prior to paracenteses. While there is likely a multifaceted etiology, she presents for repeat angiogram and possible repeat embolization if there is recanalization of superior rectal artery branches. EXAM: 1. Ultrasound-guided vascular access of the right common femoral artery. 2. Selective catheterization and angiography of the inferior mesenteric and superior rectal arteries 3. Particle and coil embolization of the right superior rectal artery 4. Coil embolization of the left superior rectal artery MEDICATIONS: None. ANESTHESIA/SEDATION:  Moderate (conscious) sedation was employed during this procedure. A total of Versed 1 mg and Fentanyl 50 mcg was administered intravenously. Moderate Sedation Time: 60 minutes. The patient's level of consciousness and vital signs were monitored continuously by radiology nursing throughout the procedure under my direct supervision. CONTRAST:  36m OMNIPAQUE IOHEXOL 300 MG/ML SOLN, 625mOMNIPAQUE IOHEXOL 300 MG/ML SOLN FLUOROSCOPY: Radiation Exposure Index (as provided by the fluoroscopic device): 1,6,160Gy Kerma COMPLICATIONS: None immediate. PROCEDURE: Informed consent was obtained from the patient following explanation of the procedure, risks, benefits and alternatives. The patient understands, agrees and consents for the procedure. All questions were addressed. A time out  was performed prior to the initiation of the procedure. Maximal barrier sterile technique utilized including caps, mask, sterile gowns, sterile gloves, large sterile drape, hand hygiene, and Betadine prep. Preprocedure ultrasound evaluation demonstrated patency of the right common femoral artery. The procedure was planned. Subdermal Local anesthesia was administered with 1% lidocaine. A small skin nick was made. Under direct ultrasound visualization, a 21 gauge micropuncture needle was inserted into the right common femoral artery. Micropuncture sheath was introduced and limited right lower extremity angiogram was performed which demonstrated adequate puncture site for closure device use. A J wire was inserted in the micropuncture sheath was exchanged for a 5 [place] sheath. A Mickelson catheter was then inserted with the reverse curve formed in the descending thoracic aorta. The catheter was then used to select the inferior mesenteric artery. Inferior mesenteric angiogram was then performed. Angiogram was significant for patency of the inferior mesenteric artery and interval cannulization of a duplicated right superior rectal artery with brisk antegrade flow to hemorrhoidal branches. Additionally, there was partial recanalization of the previously embolized left superior rectal artery. The right superior rectal artery remains embolized. A 2.4 French Progreat microcatheter and fathom 14 microwire were then used to select the right superior rectal artery. Particle embolization was then performed with 200 micron Hydropearls. Embolization was performed until flow was sluggish, however not stagnant. Next, a combination of 0.018 inch Terumo Azur microcoils were then deployed in the proximal right duplicated superior rectal artery. Repeat angiogram from the distal superior rectal artery demonstrated complete embolization of the right superior rectal arterial branches. There is persistent partial recanalization of  the previously embolized left superior rectal artery. Therefore, the catheter and wire were directed toward the left superior rectal artery ostium and an additional 0.018 inch Terumo Azur microcoil was placed. Completion angiogram demonstrates complete embolization of the bilateral superior rectal arteries. The catheter wires were removed. The indwelling right common femoral artery sheath was then exchanged for 6 French Angio-Seal was deployed successfully. Pulses were unchanged. The patient tolerated the procedure well was transferred to the recovery area in good condition. IMPRESSION: 1. Superior rectal angiography demonstrates interval recanalization of the duplicated right superior rectal artery and partial recanalization of the previously embolized left superior rectal artery. 2. Technically successful particle and coil embolization of the duplicated right superior rectal artery. Technically successful coil embolization of the left superior rectal artery. PLAN: Follow-up in 1 month in Interventional Radiology clinic. DyRuthann CancerMD [facility] Electronically Signed   By: DyRuthann Cancer.D.   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 The patient improved initially, however has had recurrent lower gastrointestinal hemorrhage which is episodic and corresponds primarily with abdominal distension prior to paracenteses. While there is likely a multifaceted etiology, she presents for repeat angiogram and possible repeat embolization if there is recanalization of superior rectal artery branches. EXAM: 1.  Ultrasound-guided vascular access of the right common femoral artery. 2. Selective catheterization and angiography of the inferior mesenteric and superior rectal arteries 3. Particle and coil embolization of the right superior rectal artery 4. Coil embolization of the left superior rectal artery MEDICATIONS: None. ANESTHESIA/SEDATION: Moderate (conscious) sedation was employed during this procedure. A total of Versed 1 mg and Fentanyl 50 mcg was administered intravenously. Moderate Sedation Time: 60 minutes. The patient's level of consciousness and vital signs were monitored continuously by radiology nursing throughout the procedure under my direct supervision. CONTRAST:  78m OMNIPAQUE IOHEXOL 300 MG/ML SOLN, 646mOMNIPAQUE IOHEXOL 300 MG/ML SOLN FLUOROSCOPY: Radiation Exposure Index (as provided by the fluoroscopic device): 1,1,017Gy Kerma COMPLICATIONS: None immediate. PROCEDURE: Informed consent was obtained from the patient following explanation of the procedure, risks, benefits and alternatives. The patient understands, agrees and consents for the procedure. All questions were addressed. A time out was performed prior to the initiation of the procedure. Maximal barrier sterile technique utilized including caps, mask, sterile gowns, sterile gloves, large sterile drape, hand hygiene, and Betadine prep. Preprocedure ultrasound evaluation demonstrated patency of the right common femoral artery. The procedure was planned. Subdermal Local anesthesia was administered with 1% lidocaine. A small skin nick was made. Under direct ultrasound visualization, a 21 gauge micropuncture needle was inserted into the right common femoral artery. Micropuncture sheath was introduced and limited right lower extremity angiogram was performed which demonstrated adequate puncture site for closure device use. A J wire was inserted in the micropuncture sheath was exchanged for a 5 [place] sheath. A Mickelson catheter was then  inserted with the reverse curve formed in the descending thoracic aorta. The catheter was then used to select the inferior mesenteric artery. Inferior mesenteric angiogram was then performed. Angiogram was significant for patency of the inferior mesenteric artery and interval cannulization of a duplicated right superior rectal artery with brisk antegrade flow to hemorrhoidal branches. Additionally, there was partial recanalization of the previously embolized left superior rectal artery. The right superior rectal artery remains embolized. A 2.4 French Progreat microcatheter and fathom 14 microwire were then used to select the right superior rectal artery. Particle embolization was then performed with 200 micron Hydropearls. Embolization was performed until flow was sluggish, however not stagnant. Next, a combination of 0.018 inch Terumo Azur microcoils were then deployed in the proximal right duplicated superior rectal artery. Repeat angiogram from the distal superior rectal artery demonstrated complete embolization of the right superior rectal arterial branches. There is persistent partial recanalization of the previously embolized left superior rectal artery. Therefore, the catheter and wire were directed toward the left superior rectal artery ostium and an additional 0.018 inch Terumo Azur microcoil was placed. Completion angiogram demonstrates complete embolization of the bilateral superior rectal arteries. The catheter wires were removed. The indwelling right common femoral artery sheath was then exchanged for 6 French Angio-Seal was deployed successfully. Pulses were unchanged. The patient tolerated the procedure well was transferred to the recovery area in good condition. IMPRESSION: 1. Superior rectal angiography demonstrates interval recanalization of the duplicated right superior rectal artery and partial recanalization of the previously embolized left superior rectal artery. 2. Technically successful  particle and coil  embolization of the duplicated right superior rectal artery. Technically successful coil embolization of the left superior rectal artery. PLAN: Follow-up in 1 month in Interventional Radiology clinic. Ruthann Cancer, MD Vascular and Interventional Radiology Specialists [facility] Rad[facility] Electronically Signed   By: Ruthann Cancer M.D.   On: 09/05/2022 19:52   US Paracentesis  Result Date: 09/02/2022 INDICATION: Refractory ascites in patient with history of NASH cirrhosis status post TIPS procedure May 2023 and TIPS revision July 2023 at [facility]. Per patient she recently underwent a second TIPS revision 08/20/2022. Request received for therapeutic paracentesis with 8 L max. EXAM: ULTRASOUND GUIDED THERAPEUTIC RIGHT LOWER QUADRANT PARACENTESIS MEDICATIONS: 20 cc 1% lidocaine COMPLICATIONS: None immediate. PROCEDURE: Informed written consent was obtained from the patient after a discussion of the risks, benefits and alternatives to treatment. A timeout was performed prior to the initiation of the procedure. Initial ultrasound scanning demonstrates a moderate amount of ascites within the right lower abdominal quadrant. The right lower abdomen was prepped and draped in the usual sterile fashion. 1% lidocaine was used for local anesthesia. Following this, a 6 Fr Safe-T-Centesis catheter was introduced. An ultrasound image was saved for documentation purposes. The paracentesis was performed. The peritoneal fluid stopped draining while ultrasound imaging showed fluid remained. Patient request another location be accessed to drain remaining fluid. The second attempt to drain fluid was unsuccessful. The catheter was removed and a dressing was applied. Dr. Annamaria Boots assessed imaging and notified patient not enough fluid remained for third attempt. The patient tolerated the procedure well without immediate post procedural complication. Patient received post-procedure intravenous albumin; see nursing notes for  details. FINDINGS: A total of approximately 4.8 L of clear, yellow fluid was removed. IMPRESSION: Successful ultrasound-guided paracentesis yielding 4.8 liters of peritoneal fluid. Read by: Narda Rutherford, AGNP-BC Electronically Signed   By: Jerilynn Mages.  Shick M.D.   On: 09/02/2022 14:08   MM 3D SCREEN BREAST BILATERAL  Result Date: 08/28/2022 CLINICAL DATA:  Screening. EXAM: DIGITAL SCREENING BILATERAL MAMMOGRAM WITH TOMOSYNTHESIS AND CAD TECHNIQUE: Bilateral screening digital craniocaudal and mediolateral oblique mammograms were obtained. Bilateral screening digital breast tomosynthesis was performed. The images were evaluated with computer-aided detection. COMPARISON:  Previous exam(s). ACR Breast Density Category c: The breast tissue is heterogeneously dense, which may obscure small masses. FINDINGS: There are no findings suspicious for malignancy. IMPRESSION: No mammographic evidence of malignancy. A result letter of this screening mammogram will be mailed directly to the patient. RECOMMENDATION: Screening mammogram in one year. (Code:[id number]) BI-RADS CATEGORY  1: Negative. Electronically Signed   By: Fidela Salisbury M.D.   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 Per patient she recently underwent a second TIPS revision 08/20/2022. EXAM: ULTRASOUND GUIDED THERAPEUTIC RIGHT UPPER QUADRANT PARACENTESIS MEDICATIONS: 10 mL 1 % lidocaine COMPLICATIONS: None immediate. PROCEDURE: Informed written consent was obtained from the patient after a discussion of the risks, benefits and alternatives to treatment. A timeout was performed prior to the initiation of the procedure. Initial ultrasound scanning demonstrates a large amount of ascites within the right lower abdominal quadrant. The right lower abdomen was prepped and draped in the usual sterile  fashion. 1% lidocaine was used for local anesthesia. Following this, a 19 gauge, 10-cm, Yueh catheter was introduced. An ultrasound image was saved for documentation purposes. The paracentesis was performed. The catheter was removed and a dressing was applied. The  patient tolerated the procedure well without immediate post procedural complication. Patient received post-procedure intravenous albumin; see nursing notes for details. FINDINGS: A total of approximately 8 L of clear, yellow fluid was removed. IMPRESSION: Successful ultrasound-guided paracentesis yielding 8 liters of peritoneal fluid. Read by: Narda Rutherford, AGNP-BC PLAN: Patient has previously undergone TIPS procedure with more recent TIPS revision, and is actively followed at [facility]. She is not followed by [facility] vascular and Interventional Radiology [facility]) for her liver-directed intervention. Michaelle Birks, MD Vascular and Interventional Radiology Specialists [facility] Electronically Signed   By: Michaelle Birks M.D.   On: 08/26/2022 12:26     Discharge Exam: Vitals:   09/24/22 0543 09/24/22 0842  BP: (!) 108/43 (!) 111/42  Pulse: 72 74  Resp: 14   Temp: 98 F (36.7 C) 97.8 F (36.6 C)  SpO2: 97% 98%   Vitals:   09/23/22 1505 09/23/22 2232 09/24/22 0543 09/24/22 0842  BP: (!) 115/45 (!) 133/58 (!) 108/43 (!) 111/42  Pulse: 71 71 72 74  Resp: '20 16 14   '$ Temp: 98.4 F (36.9 C) 98.2 F (36.8 C) 98 F (36.7 C) 97.8 F (36.6 C)  TempSrc: Oral   Oral  SpO2: 99% 98% 97% 98%  Weight:   132.9 kg   Height:        General: Pt is alert, awake, not in acute distress Cardiovascular: RRR, S1/S2 +, no rubs, no gallops Respiratory: CTA bilaterally, no wheezing, no rhonchi Abdominal: Soft, NT, ND, bowel sounds + Extremities: no edema, no cyanosis    The results of significant diagnostics from this hospitalization (including imaging, microbiology, ancillary and laboratory) are listed below for reference.      Microbiology: Recent Results (from the past 240 hour(s))  Culture, blood (Routine x 2)     Status: Abnormal   Collection Time: 09/17/22  9:11 PM   Specimen: Porta Cath; Blood  Result Value Ref Range Status   Specimen Description   Final    PORTA CATH Performed at [facility], [address], [place], [place] [address]    Special Requests   Final    BOTTLES DRAWN AEROBIC AND ANAEROBIC Blood Culture adequate volume Performed at [facility], [address] [place], [place] [address]    Culture  Setup Time   Final    GRAM POSITIVE COCCI Gram Stain Report Called to,Read Back By and Verified With: J HUMPHRIES YK [id number] K FORSYTH AEROBIC BOTTLE ONLY ANAEROBIC BOTTLE ALSO ON 09/18/22 AT 1920 BY LOY,C CRITICAL RESULT CALLED TO, READ BACK BY AND VERIFIED WITH: RN JADA MUSE ON 09/18/22 @ 2106 BY DRT    Culture (A)  Final    STAPHYLOCOCCUS CAPITIS SUSCEPTIBILITIES PERFORMED ON PREVIOUS CULTURE WITHIN THE LAST 5 DAYS. Performed at [facility], [place] [address] [place],  [address]    Report Status 09/22/2022 FINAL  Final  Blood Culture ID Panel (Reflexed)     Status: Abnormal   Collection Time: 09/17/22  9:11 PM  Result Value Ref Range Status   Enterococcus faecalis NOT DETECTED NOT DETECTED Final   Enterococcus Faecium NOT DETECTED NOT DETECTED Final   Listeria monocytogenes NOT DETECTED NOT DETECTED Final   Staphylococcus species DETECTED (A) NOT DETECTED Final    Comment: CRITICAL RESULT CALLED TO, READ BACK BY AND VERIFIED WITH: RN JADA MUSE ON 09/18/22 @ 2106 BY DRT    Staphylococcus aureus (BCID) NOT DETECTED NOT DETECTED Final   Staphylococcus epidermidis NOT DETECTED NOT DETECTED Final  Staphylococcus lugdunensis NOT DETECTED NOT DETECTED Final   Streptococcus species NOT DETECTED NOT DETECTED Final   Streptococcus agalactiae NOT DETECTED NOT DETECTED Final   Streptococcus pneumoniae NOT DETECTED NOT DETECTED Final   Streptococcus pyogenes NOT DETECTED NOT  DETECTED Final   A.calcoaceticus-baumannii NOT DETECTED NOT DETECTED Final   Bacteroides fragilis NOT DETECTED NOT DETECTED Final   Enterobacterales NOT DETECTED NOT DETECTED Final   Enterobacter cloacae complex NOT DETECTED NOT DETECTED Final   Escherichia coli NOT DETECTED NOT DETECTED Final   Klebsiella aerogenes NOT DETECTED NOT DETECTED Final   Klebsiella oxytoca NOT DETECTED NOT DETECTED Final   Klebsiella pneumoniae NOT DETECTED NOT DETECTED Final   Proteus species NOT DETECTED NOT DETECTED Final   Salmonella species NOT DETECTED NOT DETECTED Final   Serratia marcescens NOT DETECTED NOT DETECTED Final   Haemophilus influenzae NOT DETECTED NOT DETECTED Final   Neisseria meningitidis NOT DETECTED NOT DETECTED Final   Pseudomonas aeruginosa NOT DETECTED NOT DETECTED Final   Stenotrophomonas maltophilia NOT DETECTED NOT DETECTED Final   Candida albicans NOT DETECTED NOT DETECTED Final   Candida auris NOT DETECTED NOT DETECTED Final   Candida glabrata NOT DETECTED NOT DETECTED Final   Candida krusei NOT DETECTED NOT DETECTED Final   Candida parapsilosis NOT DETECTED NOT DETECTED Final   Candida tropicalis NOT DETECTED NOT DETECTED Final   Cryptococcus neoformans/gattii NOT DETECTED NOT DETECTED Final    Comment: Performed at [facility], [address], [place], [place] [address]  Urine Culture (for pregnant, neutropenic or urologic patients or patients with an indwelling urinary catheter)     Status: None   Collection Time: 09/17/22  9:20 PM   Specimen: Urine, Clean Catch  Result Value Ref Range Status   Specimen Description   Final    URINE, CLEAN CATCH Performed at [facility], [address], [place], [place] [address]    Special Requests   Final    Normal Performed at [facility], [address] [place], [place] [address]    Culture   Final    NO GROWTH Performed at [facility], [place] [address] [place], [place] [address]    Report Status 09/19/2022 FINAL   Final  Culture, blood (Routine x 2)     Status: Abnormal   Collection Time: 09/17/22  9:40 PM   Specimen: Left Antecubital; Blood  Result Value Ref Range Status   Specimen Description   Final    LEFT ANTECUBITAL Performed at [facility], [address], [place], [place] [address]    Special Requests   Final    BOTTLES DRAWN AEROBIC AND ANAEROBIC Blood Culture adequate volume Performed at [facility], [address] [place], [place] [address]    Culture  Setup Time   Final    AEROBIC BOTTLE ONLY GRAM POSITIVE COCCI CRITICAL VALUE NOTED.  VALUE IS CONSISTENT WITH PREVIOUSLY REPORTED AND CALLED VALUE. ANAEROBIC BOTTLE GRAM POSITIVE COCCI    Culture STAPHYLOCOCCUS CAPITIS (A)  Final   Report Status 09/21/2022 FINAL  Final   Organism ID, Bacteria STAPHYLOCOCCUS CAPITIS  Final      Susceptibility   Staphylococcus capitis - MIC*    CIPROFLOXACIN <=0.5 SENSITIVE Sensitive     ERYTHROMYCIN <=0.25 SENSITIVE Sensitive     GENTAMICIN <=0.5 SENSITIVE Sensitive     OXACILLIN <=0.25 SENSITIVE Sensitive     TETRACYCLINE <=1 SENSITIVE Sensitive     VANCOMYCIN <=0.5 SENSITIVE Sensitive     TRIMETH/SULFA <=10 SENSITIVE Sensitive  CLINDAMYCIN <=0.25 SENSITIVE Sensitive     RIFAMPIN <=0.5 SENSITIVE Sensitive     Inducible Clindamycin NEGATIVE Sensitive     * STAPHYLOCOCCUS CAPITIS  Resp panel by RT-PCR (RSV, Flu A&B, Covid) Anterior Nasal Swab     Status: None   Collection Time: 09/17/22 10:53 PM   Specimen: Anterior Nasal Swab  Result Value Ref Range Status   SARS Coronavirus 2 by RT PCR NEGATIVE NEGATIVE Final    Comment: (NOTE) SARS-CoV-2 target nucleic acids are NOT DETECTED.  The SARS-CoV-2 RNA is generally detectable in upper respiratory specimens during the acute phase of infection. The lowest concentration of SARS-CoV-2 viral copies this assay can detect is 138 copies/mL. A negative result does not preclude SARS-Cov-2 infection and should not be used as the sole basis for  treatment or other patient management decisions. A negative result may occur with  improper specimen collection/handling, submission of specimen other than nasopharyngeal swab, presence of viral mutation(s) within the areas targeted by this assay, and inadequate number of viral copies(<138 copies/mL). A negative result must be combined with clinical observations, patient history, and epidemiological information. The expected result is Negative.  Fact Sheet for Patients:  EntrepreneurPulse.com.au  Fact Sheet for Healthcare Providers:  IncredibleEmployment.be  This test is no t yet approved or cleared by the [place] FDA and  has been authorized for detection and/or diagnosis of SARS-CoV-2 by FDA under an Emergency Use Authorization (EUA). This EUA will remain  in effect (meaning this test can be used) for the duration of the COVID-19 declaration under Section 564(b)(1) of the Act, 21 U.S.C.section 360bbb-3(b)(1), unless the authorization is terminated  or revoked sooner.       Influenza A by PCR NEGATIVE NEGATIVE Final   Influenza B by PCR NEGATIVE NEGATIVE Final    Comment: (NOTE) The Xpert Xpress SARS-CoV-2/FLU/RSV plus assay is intended as an aid in the diagnosis of influenza from Nasopharyngeal swab specimens and should not be used as a sole basis for treatment. Nasal washings and aspirates are unacceptable for Xpert Xpress SARS-CoV-2/FLU/RSV testing.  Fact Sheet for Patients: EntrepreneurPulse.com.au  Fact Sheet for Healthcare Providers: IncredibleEmployment.be  This test is not yet approved or cleared by the [place] FDA and has been authorized for detection and/or diagnosis of SARS-CoV-2 by FDA under an Emergency Use Authorization (EUA). This EUA will remain in effect (meaning this test can be used) for the duration of the COVID-19 declaration under Section 564(b)(1) of the Act, 21  U.S.C. section 360bbb-3(b)(1), unless the authorization is terminated or revoked.     Resp Syncytial Virus by PCR NEGATIVE NEGATIVE Final    Comment: (NOTE) Fact Sheet for Patients: EntrepreneurPulse.com.au  Fact Sheet for Healthcare Providers: IncredibleEmployment.be  This test is not yet approved or cleared by the [place] FDA and has been authorized for detection and/or diagnosis of SARS-CoV-2 by FDA under an Emergency Use Authorization (EUA). This EUA will remain in effect (meaning this test can be used) for the duration of the COVID-19 declaration under Section 564(b)(1) of the Act, 21 U.S.C. section 360bbb-3(b)(1), unless the authorization is terminated or revoked.  Performed at [facility], [address] [place], [place] [address]   Culture, body fluid w Gram Stain-bottle     Status: None   Collection Time: 09/18/22  5:00 AM   Specimen: Peritoneal Washings  Result Value Ref Range Status   Specimen Description PERITONEAL  Final   Special Requests BOTTLES DRAWN AEROBIC AND ANAEROBIC 10CC  Final   Culture  Final    NO GROWTH 5 DAYS Performed at [facility], [address] [place], [place] [address]    Report Status 09/23/2022 FINAL  Final  Gram stain     Status: None   Collection Time: 09/18/22  5:00 AM   Specimen: Peritoneal Washings  Result Value Ref Range Status   Specimen Description PERITONEAL  Final   Special Requests NONE  Final   Gram Stain   Final    CYTOSPIN SMEAR NO ORGANISMS SEEN WBC PRESENT,BOTH PMN AND MONONUCLEAR Performed at [facility], [address] [place], [place] [address]    Report Status 09/18/2022 FINAL  Final  Culture, blood (Routine X 2) w Reflex to ID Panel     Status: None (Preliminary result)   Collection Time: 09/19/22 12:38 AM   Specimen: BLOOD  Result Value Ref Range Status   Specimen Description BLOOD BLOOD RIGHT WRIST  Final   Special Requests   Final    BOTTLES DRAWN AEROBIC AND  ANAEROBIC Blood Culture adequate volume   Culture   Final    NO GROWTH 4 DAYS Performed at [facility], [address] [place], [place] [address]    Report Status PENDING  Incomplete  Culture, blood (Routine X 2) w Reflex to ID Panel     Status: None (Preliminary result)   Collection Time: 09/19/22 12:40 AM   Specimen: BLOOD  Result Value Ref Range Status   Specimen Description BLOOD BLOOD RIGHT HAND  Final   Special Requests   Final    BOTTLES DRAWN AEROBIC AND ANAEROBIC Blood Culture adequate volume   Culture   Final    NO GROWTH 4 DAYS Performed at [facility], [address] [place], [place] [address]    Report Status PENDING  Incomplete  Culture, blood (single) w Reflex to ID Panel     Status: None (Preliminary result)   Collection Time: 09/22/22  8:49 AM   Specimen: Right Antecubital; Blood  Result Value Ref Range Status   Specimen Description RIGHT ANTECUBITAL  Final   Special Requests   Final    BOTTLES DRAWN AEROBIC AND ANAEROBIC Blood Culture results may not be optimal due to an excessive volume of blood received in culture bottles   Culture   Final    NO GROWTH < 24 HOURS Performed at [facility] [address], [place], [place] [address]    Report Status PENDING  Incomplete  Culture, body fluid w Gram Stain-bottle     Status: None (Preliminary result)   Collection Time: 09/22/22 10:31 AM   Specimen: Peritoneal Washings  Result Value Ref Range Status   Specimen Description PERITONEAL BOTTLES DRAWN AEROBIC AND ANAEROBIC  Final   Special Requests Blood Culture adequate volume  Final   Culture   Final    NO GROWTH < 24 HOURS Performed at [facility], [address], [place], [place] [address]    Report Status PENDING  Incomplete  Gram stain     Status: None   Collection Time: 09/22/22 10:31 AM   Specimen: Peritoneal Washings  Result Value Ref Range Status   Specimen Description PERITONEAL  Final   Special Requests NONE  Final   Gram Stain   Final    NO  ORGANISMS SEEN WBC PRESENT,BOTH PMN AND MONONUCLEAR CYTOSPIN SMEAR Performed at [facility], [address], [place],  [address]    Report Status 09/22/2022 FINAL  Final  Surgical pcr screen     Status: None   Collection Time:  09/23/22  5:18 AM   Specimen: Nasal Mucosa; Nasal Swab  Result Value Ref Range Status   MRSA, PCR NEGATIVE NEGATIVE Final   Staphylococcus aureus NEGATIVE NEGATIVE Final    Comment: (NOTE) The Xpert SA Assay (FDA approved for NASAL specimens in patients 38 years of age and older), is one component of a comprehensive surveillance program. It is not intended to diagnose infection nor to guide or monitor treatment. Performed at [facility], [address] [place], [place] [address]   Aerobic/Anaerobic Culture w Gram Stain (surgical/deep wound)     Status: None (Preliminary result)   Collection Time: 09/23/22  9:03 AM   Specimen: Catheter Tip  Result Value Ref Range Status   Specimen Description   Final    CATH TIP Performed at [facility], [address], [place], [place] [address]    Special Requests   Final    NONE Performed at [facility], [address], [place], [place] [address]    Gram Stain NO WBC SEEN NO ORGANISMS SEEN   Final   Culture   Final    NO GROWTH < 12 HOURS Performed at [facility], [place] [address] [place], [place] [address]    Report Status PENDING  Incomplete     Labs: BNP (last 3 results) No results for input(s): "BNP" in the last 8760 hours. Basic Metabolic Panel: Recent Labs  Lab 09/18/22 1253 09/19/22 0359 09/20/22 0508 09/21/22 0455 09/23/22 0423 09/24/22 0500  NA  --  136 136 134* 136 136  K  --  4.1 3.8 3.7 3.8 3.3*  CL  --  106 109 107 109 107  CO2  --  20* 21* 20* 22 22  GLUCOSE  --  134* 155* 123* 166* 136*  BUN  --  37* 37* 35* 32* 31*  CREATININE  --  1.58* 1.70* 1.52* 1.45* 1.41*  CALCIUM  --  7.8* 7.5* 7.6* 7.8* 7.7*  MG 2.1  --   --   --   --   --   PHOS 3.3  --   --   --   --   --     Liver Function Tests: Recent Labs  Lab 09/17/22 2111 09/19/22 0359 09/20/22 0508 09/21/22 0455  AST 53* 48* 44* 41  ALT '23 20 21 22  '$ ALKPHOS 98 76 83 89  BILITOT 3.0* 1.8* 1.8* 1.7*  PROT 4.8* 4.3* 4.2* 4.4*  ALBUMIN 2.6* 2.2* 2.1* 2.1*   No results for input(s): "LIPASE", "AMYLASE" in the last 168 hours. No results for input(s): "AMMONIA" in the last 168 hours. CBC: Recent Labs  Lab 09/17/22 1322 09/17/22 2111 09/19/22 0359 09/20/22 0508 09/21/22 0455 09/23/22 0423 09/24/22 0500  WBC 4.7 16.0* 8.4 6.4 4.8 5.0 3.6*  NEUTROABS 3.5 14.9* 6.6 4.6 3.0  --   --   HGB 7.1* 6.7* 7.2* 6.9* 8.4* 8.1* 7.9*  HCT 22.6* 20.8* 23.3* 21.7* 25.8* 26.2* 25.3*  MCV 80.4 78.8* 79.3* 79.2* 79.1* 81.6 81.6  PLT 116* 123* 104* 110* 131* 129* 112*   Cardiac Enzymes: No results for input(s): "CKTOTAL", "CKMB", "CKMBINDEX", "TROPONINI" in the last 168 hours. BNP: Invalid input(s): "POCBNP" CBG: Recent Labs  Lab 09/22/22 0743 09/22/22 1602 09/23/22 0739 09/23/22 1205 09/24/22 0734  GLUCAP 104* 157* 142* 111* 160*   D-Dimer No results for input(s): "DDIMER" in the last 72 hours. Hgb A1c No results for input(s): "HGBA1C" in the last 72 hours. Lipid Profile No results for input(s): "CHOL", "HDL", "  [place]", "TRIG", "CHOLHDL", "LDLDIRECT" in the last 72 hours. Thyroid function studies No results for input(s): "TSH", "T4TOTAL", "T3FREE", "THYROIDAB" in the last 72 hours.  Invalid input(s): "FREET3" Anemia work up No results for input(s): "VITAMINB12", "FOLATE", "FERRITIN", "TIBC", "IRON", "RETICCTPCT" in the last 72 hours. Urinalysis    Component Value Date/Time   COLORURINE AMBER (A) 09/17/2022 2120   APPEARANCEUR CLEAR 09/17/2022 2120   LABSPEC 1.018 09/17/2022 2120   PHURINE 5.0 09/17/2022 2120   GLUCOSEU NEGATIVE 09/17/2022 2120   HGBUR NEGATIVE 09/17/2022 2120   BILIRUBINUR NEGATIVE 09/17/2022 2120   KETONESUR NEGATIVE 09/17/2022 2120   PROTEINUR NEGATIVE 09/17/2022  2120   NITRITE NEGATIVE 09/17/2022 2120   LEUKOCYTESUR NEGATIVE 09/17/2022 2120   Sepsis Labs Recent Labs  Lab 09/20/22 0508 09/21/22 0455 09/23/22 0423 09/24/22 0500  WBC 6.4 4.8 5.0 3.6*   Microbiology Recent Results (from the past 240 hour(s))  Culture, blood (Routine x 2)     Status: Abnormal   Collection Time: 09/17/22  9:11 PM   Specimen: Porta Cath; Blood  Result Value Ref Range Status   Specimen Description   Final    PORTA CATH Performed at [facility], [address], [place], [place] [address]    Special Requests   Final    BOTTLES DRAWN AEROBIC AND ANAEROBIC Blood Culture adequate volume Performed at [facility], [address], [place], [place] [address]    Culture  Setup Time   Final    GRAM POSITIVE COCCI Gram Stain Report Called to,Read Back By and Verified With: [id number] K FORSYTH AEROBIC BOTTLE ONLY ANAEROBIC BOTTLE ALSO ON 09/18/22 AT 1920 BY LOY,C CRITICAL RESULT CALLED TO, READ BACK BY AND VERIFIED WITH: RN JADA MUSE ON 09/18/22 @ 2106 BY DRT    Culture (A)  Final    STAPHYLOCOCCUS CAPITIS SUSCEPTIBILITIES PERFORMED ON PREVIOUS CULTURE WITHIN THE LAST 5 DAYS. Performed at [facility], [place] [address] [place],  [address]    Report Status 09/22/2022 FINAL  Final  Blood Culture ID Panel (Reflexed)     Status: Abnormal   Collection Time: 09/17/22  9:11 PM  Result Value Ref Range Status   Enterococcus faecalis NOT DETECTED NOT DETECTED Final   Enterococcus Faecium NOT DETECTED NOT DETECTED Final   Listeria monocytogenes NOT DETECTED NOT DETECTED Final   Staphylococcus species DETECTED (A) NOT DETECTED Final    Comment: CRITICAL RESULT CALLED TO, READ BACK BY AND VERIFIED WITH: RN JADA MUSE ON 09/18/22 @ 2106 BY DRT    Staphylococcus aureus (BCID) NOT DETECTED NOT DETECTED Final   Staphylococcus epidermidis NOT DETECTED NOT DETECTED Final   Staphylococcus lugdunensis NOT DETECTED NOT DETECTED Final   Streptococcus species NOT  DETECTED NOT DETECTED Final   Streptococcus agalactiae NOT DETECTED NOT DETECTED Final   Streptococcus pneumoniae NOT DETECTED NOT DETECTED Final   Streptococcus pyogenes NOT DETECTED NOT DETECTED Final   A.calcoaceticus-baumannii NOT DETECTED NOT DETECTED Final   Bacteroides fragilis NOT DETECTED NOT DETECTED Final   Enterobacterales NOT DETECTED NOT DETECTED Final   Enterobacter cloacae complex NOT DETECTED NOT DETECTED Final   Escherichia coli NOT DETECTED NOT DETECTED Final   Klebsiella aerogenes NOT DETECTED NOT DETECTED Final   Klebsiella oxytoca NOT DETECTED NOT DETECTED Final   Klebsiella pneumoniae NOT DETECTED NOT DETECTED Final   Proteus species NOT DETECTED NOT DETECTED Final   Salmonella species NOT DETECTED NOT DETECTED Final   Serratia marcescens NOT DETECTED NOT DETECTED Final   Haemophilus  influenzae NOT DETECTED NOT DETECTED Final   Neisseria meningitidis NOT DETECTED NOT DETECTED Final   Pseudomonas aeruginosa NOT DETECTED NOT DETECTED Final   Stenotrophomonas maltophilia NOT DETECTED NOT DETECTED Final   Candida albicans NOT DETECTED NOT DETECTED Final   Candida auris NOT DETECTED NOT DETECTED Final   Candida glabrata NOT DETECTED NOT DETECTED Final   Candida krusei NOT DETECTED NOT DETECTED Final   Candida parapsilosis NOT DETECTED NOT DETECTED Final   Candida tropicalis NOT DETECTED NOT DETECTED Final   Cryptococcus neoformans/gattii NOT DETECTED NOT DETECTED Final    Comment: Performed at [facility], [place] [address], [place], [place] [address]  Urine Culture (for pregnant, neutropenic or urologic patients or patients with an indwelling urinary catheter)     Status: None   Collection Time: 09/17/22  9:20 PM   Specimen: Urine, Clean Catch  Result Value Ref Range Status   Specimen Description   Final    URINE, CLEAN CATCH Performed at [facility] [facility], [address], [place], [place] [address]    Special Requests   Final    Normal Performed at [facility], [address] [place], [place] [address]    Culture   Final    NO GROWTH Performed at [facility], [place] [address] [place], [place] [address]    Report Status 09/19/2022 FINAL  Final  Culture, blood (Routine x 2)     Status: Abnormal   Collection Time: 09/17/22  9:40 PM   Specimen: Left Antecubital; Blood  Result Value Ref Range Status   Specimen Description   Final    LEFT ANTECUBITAL Performed at [facility], [address] [place], [place] [address]    Special Requests   Final    BOTTLES DRAWN AEROBIC AND ANAEROBIC Blood Culture adequate volume Performed at [facility], [address], [place],  [address]    Culture  Setup Time   Final    AEROBIC BOTTLE ONLY GRAM POSITIVE COCCI CRITICAL VALUE NOTED.  VALUE IS CONSISTENT WITH PREVIOUSLY REPORTED AND CALLED VALUE. ANAEROBIC BOTTLE GRAM POSITIVE COCCI    Culture STAPHYLOCOCCUS CAPITIS (A)  Final   Report Status 09/21/2022 FINAL  Final   Organism ID, Bacteria STAPHYLOCOCCUS CAPITIS  Final      Susceptibility   Staphylococcus capitis - MIC*    CIPROFLOXACIN <=0.5 SENSITIVE Sensitive     ERYTHROMYCIN <=0.25 SENSITIVE Sensitive     GENTAMICIN <=0.5 SENSITIVE Sensitive     OXACILLIN <=0.25 SENSITIVE Sensitive     TETRACYCLINE <=1 SENSITIVE Sensitive     VANCOMYCIN <=0.5 SENSITIVE Sensitive     TRIMETH/SULFA <=10 SENSITIVE Sensitive     CLINDAMYCIN <=0.25 SENSITIVE Sensitive     RIFAMPIN <=0.5 SENSITIVE Sensitive     Inducible Clindamycin NEGATIVE Sensitive     * STAPHYLOCOCCUS CAPITIS  Resp panel by RT-PCR (RSV, Flu A&B, Covid) Anterior Nasal Swab     Status: None   Collection Time: 09/17/22 10:53 PM   Specimen: Anterior Nasal Swab  Result Value Ref Range Status   SARS Coronavirus 2 by RT PCR NEGATIVE NEGATIVE Final    Comment: (NOTE) SARS-CoV-2 target nucleic acids are NOT DETECTED.  The SARS-CoV-2 RNA is generally detectable in upper respiratory specimens during the acute phase of infection. The  lowest concentration of SARS-CoV-2 viral copies this assay can detect is 138 copies/mL. A negative result does not preclude SARS-Cov-2 infection and should not be used as the sole basis for treatment or other patient management decisions.  A negative result may occur with  improper specimen collection/handling, submission of specimen other than nasopharyngeal swab, presence of viral mutation(s) within the areas targeted by this assay, and inadequate number of viral copies(<138 copies/mL). A negative result must be combined with clinical observations, patient history, and epidemiological information. The expected result is Negative.  Fact Sheet for Patients:  EntrepreneurPulse.com.au  Fact Sheet for Healthcare Providers:  IncredibleEmployment.be  This test is no t yet approved or cleared by the [place] FDA and  has been authorized for detection and/or diagnosis of SARS-CoV-2 by FDA under an Emergency Use Authorization (EUA). This EUA will remain  in effect (meaning this test can be used) for the duration of the COVID-19 declaration under Section 564(b)(1) of the Act, 21 U.S.C.section 360bbb-3(b)(1), unless the authorization is terminated  or revoked sooner.       Influenza A by PCR NEGATIVE NEGATIVE Final   Influenza B by PCR NEGATIVE NEGATIVE Final    Comment: (NOTE) The Xpert Xpress SARS-CoV-2/FLU/RSV plus assay is intended as an aid in the diagnosis of influenza from Nasopharyngeal swab specimens and should not be used as a sole basis for treatment. Nasal washings and aspirates are unacceptable for Xpert Xpress SARS-CoV-2/FLU/RSV testing.  Fact Sheet for Patients: EntrepreneurPulse.com.au  Fact Sheet for Healthcare Providers: IncredibleEmployment.be  This test is not yet approved or cleared by the [place] FDA and has been authorized for detection and/or diagnosis of SARS-CoV-2 by FDA under  an Emergency Use Authorization (EUA). This EUA will remain in effect (meaning this test can be used) for the duration of the COVID-19 declaration under Section 564(b)(1) of the Act, 21 U.S.C. section 360bbb-3(b)(1), unless the authorization is terminated or revoked.     Resp Syncytial Virus by PCR NEGATIVE NEGATIVE Final    Comment: (NOTE) Fact Sheet for Patients: EntrepreneurPulse.com.au  Fact Sheet for Healthcare Providers: IncredibleEmployment.be  This test is not yet approved or cleared by the [place] FDA and has been authorized for detection and/or diagnosis of SARS-CoV-2 by FDA under an Emergency Use Authorization (EUA). This EUA will remain in effect (meaning this test can be used) for the duration of the COVID-19 declaration under Section 564(b)(1) of the Act, 21 U.S.C. section 360bbb-3(b)(1), unless the authorization is terminated or revoked.  Performed at [facility], [address], [place], [place] [address]   Culture, body fluid w Gram Stain-bottle     Status: None   Collection Time: 09/18/22  5:00 AM   Specimen: Peritoneal Washings  Result Value Ref Range Status   Specimen Description PERITONEAL  Final   Special Requests BOTTLES DRAWN AEROBIC AND ANAEROBIC 10CC  Final   Culture   Final    NO GROWTH 5 DAYS Performed at [facility], [address], [place], [place] [address]    Report Status 09/23/2022 FINAL  Final  Gram stain     Status: None   Collection Time: 09/18/22  5:00 AM   Specimen: Peritoneal Washings  Result Value Ref Range Status   Specimen Description PERITONEAL  Final   Special Requests NONE  Final   Gram Stain   Final    CYTOSPIN SMEAR NO ORGANISMS SEEN WBC PRESENT,BOTH PMN AND MONONUCLEAR Performed at [facility], [address], [place], [place] [address]    Report Status 09/18/2022 FINAL  Final  Culture, blood (Routine X 2) w Reflex to ID Panel     Status: None (Preliminary result)   Collection  Time: 09/19/22 12:38 AM   Specimen: BLOOD  Result Value Ref Range Status   Specimen Description BLOOD BLOOD RIGHT WRIST  Final   Special Requests   Final    BOTTLES DRAWN AEROBIC AND ANAEROBIC Blood Culture adequate volume   Culture   Final    NO GROWTH 4 DAYS Performed at [facility], [address] [place], [place] [address]    Report Status PENDING  Incomplete  Culture, blood (Routine X 2) w Reflex to ID Panel     Status: None (Preliminary result)   Collection Time: 09/19/22 12:40 AM   Specimen: BLOOD  Result Value Ref Range Status   Specimen Description BLOOD BLOOD RIGHT HAND  Final   Special Requests   Final    BOTTLES DRAWN AEROBIC AND ANAEROBIC Blood Culture adequate volume   Culture   Final    NO GROWTH 4 DAYS Performed at [facility], [address] [place], [place] [address]    Report Status PENDING  Incomplete  Culture, blood (single) w Reflex to ID Panel     Status: None (Preliminary result)   Collection Time: 09/22/22  8:49 AM   Specimen: Right Antecubital; Blood  Result Value Ref Range Status   Specimen Description RIGHT ANTECUBITAL  Final   Special Requests   Final    BOTTLES DRAWN AEROBIC AND ANAEROBIC Blood Culture results may not be optimal due to an excessive volume of blood received in culture bottles   Culture   Final    NO GROWTH < 24 HOURS Performed at [facility], [address], [place], [place] [address]    Report Status PENDING  Incomplete  Culture, body fluid w Gram Stain-bottle     Status: None (Preliminary result)   Collection Time: 09/22/22 10:31 AM   Specimen: Peritoneal Washings  Result Value Ref Range Status   Specimen Description PERITONEAL BOTTLES DRAWN AEROBIC AND ANAEROBIC  Final   Special Requests Blood Culture adequate volume  Final   Culture   Final    NO GROWTH < 24 HOURS Performed at [facility], [address], [place], [place] [address]    Report Status PENDING  Incomplete  Gram stain     Status: None   Collection Time:  09/22/22 10:31 AM   Specimen: Peritoneal Washings  Result Value Ref Range Status   Specimen Description PERITONEAL  Final   Special Requests NONE  Final   Gram Stain   Final    NO ORGANISMS SEEN WBC PRESENT,BOTH PMN AND MONONUCLEAR CYTOSPIN SMEAR Performed at [facility], [address] [place], [place] [address]    Report Status 09/22/2022 FINAL  Final  Surgical pcr screen     Status: None   Collection Time: 09/23/22  5:18 AM   Specimen: Nasal Mucosa; Nasal Swab  Result Value Ref Range Status   MRSA, PCR NEGATIVE NEGATIVE Final   Staphylococcus aureus NEGATIVE NEGATIVE Final    Comment: (NOTE) The Xpert SA Assay (FDA approved for NASAL specimens in patients 67 years of age and older), is one component of a comprehensive surveillance program. It is not intended to diagnose infection nor to guide or monitor treatment. Performed at [facility], [address], [place], [place] [address]   Aerobic/Anaerobic Culture w Gram Stain (surgical/deep wound)     Status: None (Preliminary result)   Collection Time: 09/23/22  9:03 AM   Specimen: Catheter Tip  Result Value Ref Range Status   Specimen Description   Final    CATH TIP Performed at [facility], [address], [place],  [place] [address]    Special Requests   Final    NONE Performed at [facility], [address], [place], [place] [address]    Gram Stain NO WBC SEEN NO ORGANISMS SEEN   Final   Culture   Final    NO GROWTH < 12 HOURS Performed at [facility], [place] [address], [place], [place] [address]    Report Status PENDING  Incomplete     Time coordinating discharge: 35 minutes  SIGNED:   Rodena Goldmann, DO [facility] 09/24/2022, 11:14 AM  If 7PM-7AM, please contact night-coverage [email or web address]

## 2022-09-24 NOTE — TOC Transition Note (Signed)
Transition of Care [facility]) - CM/SW Discharge Note   Patient Details  Name: Rachel Huber MRN: [id number] Date of Birth: Nov 01, 1955  [facility] CM/SW Contact:  Shade Flood, LCSW Phone Number: 09/24/2022, 12:35 PM   Clinical Narrative:     Pt medically stable for dc home with IV anbx at home per MD. Updated Pam with [employer] and she will be here between 1-2pm today to do teaching with pt. Pam has arranged for [facility] RN with [place] and they will start with pt's care tomorrow.  There are no other TOC needs for dc.  Final next level of care: [place] Barriers to Discharge: Barriers Resolved   Patient Goals and CMS Choice      Discharge Placement                         Discharge Plan and Services Additional resources added to the After Visit Summary for                            [facility] Arranged: RN          Social Determinants of Health (SDOH) Interventions SDOH Screenings   Food Insecurity: No Food Insecurity (09/18/2022)  Housing: Low Risk  (09/18/2022)  Transportation Needs: No Transportation Needs (09/18/2022)  Utilities: Not At Risk (09/18/2022)  Alcohol Screen: Low Risk  (04/04/2022)  Depression (PHQ2-9): Low Risk  (04/04/2022)  Financial Resource Strain: Low Risk  (04/04/2022)  Physical Activity: Inactive (04/04/2022)  Social Connections: Moderately Isolated (04/04/2022)  Stress: No Stress Concern Present (04/04/2022)  Tobacco Use: Low Risk  (09/23/2022)     Readmission Risk Interventions    09/18/2022    2:19 PM 03/26/2022    2:40 PM  Readmission Risk Prevention Plan  Transportation Screening Complete Complete  Medication Review Press photographer) Complete Complete  PCP or Specialist appointment within 3-5 days of discharge Complete Complete  HRI or Home Care Consult Complete Complete  SW Recovery Care/Counseling Consult Complete Complete  Palliative Care Screening Not Applicable Not [place]  Not Applicable Not Applicable

## 2022-09-24 NOTE — Progress Notes (Signed)
Patient discharged home with instructions given on medications and follow up visits,patient verbalized understanding. Prescriptions sent to Pharmacy of choice documented  AVS. Accompanied by staff to an awaiting vehicle. Discharged with PICC will continue IV antibiotic therapy at home. [facility] to follow up with patient.Marland Kitchen

## 2022-09-25 ENCOUNTER — Inpatient Hospital Stay: Payer: 59

## 2022-09-25 LAB — CULTURE, BLOOD (ROUTINE X 2)
Culture: NO GROWTH
Culture: NO GROWTH
Special Requests: ADEQUATE
Special Requests: ADEQUATE

## 2022-09-26 NOTE — Anesthesia Preprocedure Evaluation (Signed)
Anesthesia Evaluation  Patient identified by MRN, date of birth, ID band Patient awake    Reviewed: Allergy & Precautions, H&P , NPO status , Patient's Chart, lab work & pertinent test results, reviewed documented beta blocker date and time   Airway Mallampati: II  TM Distance: >3 FB Neck ROM: full    Dental no notable dental hx.    Pulmonary neg pulmonary ROS, shortness of breath, sleep apnea , pneumonia   Pulmonary exam normal breath sounds clear to auscultation       Cardiovascular Exercise Tolerance: Good hypertension, negative cardio ROS  Rhythm:regular Rate:Normal     Neuro/Psych  PSYCHIATRIC DISORDERS Anxiety Depression    negative neurological ROS  negative psych ROS   GI/Hepatic negative GI ROS, Neg liver ROS,GERD  ,,(+) Hepatitis -  Endo/Other  negative endocrine ROSdiabetes    Renal/GU Renal diseasenegative Renal ROS  negative genitourinary   Musculoskeletal   Abdominal   Peds  Hematology negative hematology ROS (+) Blood dyscrasia, anemia   Anesthesia Other Findings   Reproductive/Obstetrics negative OB ROS                             Anesthesia Physical Anesthesia Plan  ASA: 4 and emergent  Anesthesia Plan: General   Post-op Pain Management:    Induction:   PONV Risk Score and Plan: Propofol infusion  Airway Management Planned:   Additional Equipment:   Intra-op Plan:   Post-operative Plan:   Informed Consent: I have reviewed the patients History and Physical, chart, labs and discussed the procedure including the risks, benefits and alternatives for the proposed anesthesia with the patient or authorized representative who has indicated his/her understanding and acceptance.     Dental Advisory Given  Plan Discussed with: CRNA  Anesthesia Plan Comments:        Anesthesia Quick Evaluation

## 2022-09-26 NOTE — Anesthesia Postprocedure Evaluation (Signed)
Anesthesia Post Note  Patient: Rachel Huber  Procedure(s) Performed: TRANSESOPHAGEAL ECHOCARDIOGRAM (TEE)  Patient location during evaluation: Phase II Anesthesia Type: General Level of consciousness: awake Pain management: pain level controlled Vital Signs Assessment: post-procedure vital signs reviewed and stable Respiratory status: spontaneous breathing and respiratory function stable Cardiovascular status: blood pressure returned to baseline and stable Postop Assessment: no headache and no apparent nausea or vomiting Anesthetic complications: no Comments: Late entry   No notable events documented.   Last Vitals:  Vitals:   09/24/22 0543 09/24/22 0842  BP: (!) 108/43 (!) 111/42  Pulse: 72 74  Resp: 14   Temp: 36.7 C 36.6 C  SpO2: 97% 98%    Last Pain:  Vitals:   09/24/22 0842  TempSrc: Oral  PainSc: 0-No pain                 Louann Sjogren

## 2022-09-27 LAB — CULTURE, BLOOD (SINGLE): Culture: NO GROWTH

## 2022-09-27 LAB — CULTURE, BODY FLUID W GRAM STAIN -BOTTLE
Culture: NO GROWTH
Special Requests: ADEQUATE

## 2022-09-28 LAB — AEROBIC/ANAEROBIC CULTURE W GRAM STAIN (SURGICAL/DEEP WOUND)
Culture: NO GROWTH
Gram Stain: NONE SEEN

## 2022-09-29 ENCOUNTER — Encounter (HOSPITAL_COMMUNITY): Payer: Self-pay | Admitting: Cardiology

## 2022-09-29 DIAGNOSIS — Z6841 Body Mass Index (BMI) 40.0 and over, adult: Secondary | ICD-10-CM | POA: Diagnosis not present

## 2022-09-29 DIAGNOSIS — K7469 Other cirrhosis of liver: Secondary | ICD-10-CM | POA: Diagnosis not present

## 2022-09-29 DIAGNOSIS — E1140 Type 2 diabetes mellitus with diabetic neuropathy, unspecified: Secondary | ICD-10-CM | POA: Diagnosis not present

## 2022-09-30 ENCOUNTER — Ambulatory Visit
Admission: RE | Admit: 2022-09-30 | Discharge: 2022-09-30 | Disposition: A | Discharge status: Home / Self Care | Payer: 59 | Source: Ambulatory Visit | Attending: Gastroenterology | Admitting: Gastroenterology

## 2022-09-30 DIAGNOSIS — K7460 Unspecified cirrhosis of liver: Secondary | ICD-10-CM | POA: Diagnosis not present

## 2022-09-30 DIAGNOSIS — K7581 Nonalcoholic steatohepatitis (NASH): Secondary | ICD-10-CM | POA: Insufficient documentation

## 2022-09-30 DIAGNOSIS — R188 Other ascites: Secondary | ICD-10-CM | POA: Diagnosis not present

## 2022-09-30 MED ORDER — ALBUMIN HUMAN 25 % IV SOLN
INTRAVENOUS | Status: AC
  Administered 2022-09-30: 25 g via INTRAVENOUS
  Filled 2022-09-30: qty 100

## 2022-09-30 MED ORDER — ALBUMIN HUMAN 25 % IV SOLN: 25.0000 g | Freq: Once | INTRAVENOUS | Status: AC

## 2022-09-30 MED ORDER — LIDOCAINE HCL (PF) 1 % IJ SOLN
10.0000 mL | Freq: Once | INTRAMUSCULAR | Status: AC
  Administered 2022-09-30: 10 mL via INTRADERMAL
  Filled 2022-09-30: qty 10

## 2022-09-30 NOTE — Procedures (Signed)
PROCEDURE SUMMARY:  Successful US guided therapeutic paracentesis from RUQ.  Yielded 825 cc of clear, yellow fluid.  No immediate complications.  Pt tolerated well.   Specimen not sent for labs.  EBL < 1 mL  Tyson Alias, AGNP 09/30/2022 11:57 AM

## 2022-10-01 ENCOUNTER — Inpatient Hospital Stay: Payer: 59

## 2022-10-01 ENCOUNTER — Inpatient Hospital Stay: Payer: 59 | Attending: Hematology

## 2022-10-01 DIAGNOSIS — Z8051 Family history of malignant neoplasm of kidney: Secondary | ICD-10-CM | POA: Diagnosis not present

## 2022-10-01 DIAGNOSIS — Z807 Family history of other malignant neoplasms of lymphoid, hematopoietic and related tissues: Secondary | ICD-10-CM | POA: Insufficient documentation

## 2022-10-01 DIAGNOSIS — D500 Iron deficiency anemia secondary to blood loss (chronic): Secondary | ICD-10-CM

## 2022-10-01 DIAGNOSIS — E559 Vitamin D deficiency, unspecified: Secondary | ICD-10-CM | POA: Insufficient documentation

## 2022-10-01 DIAGNOSIS — D631 Anemia in chronic kidney disease: Secondary | ICD-10-CM | POA: Diagnosis not present

## 2022-10-01 DIAGNOSIS — E538 Deficiency of other specified B group vitamins: Secondary | ICD-10-CM | POA: Insufficient documentation

## 2022-10-01 DIAGNOSIS — K7460 Unspecified cirrhosis of liver: Secondary | ICD-10-CM | POA: Diagnosis not present

## 2022-10-01 DIAGNOSIS — D563 Thalassemia minor: Secondary | ICD-10-CM | POA: Diagnosis not present

## 2022-10-01 DIAGNOSIS — Z8542 Personal history of malignant neoplasm of other parts of uterus: Secondary | ICD-10-CM | POA: Insufficient documentation

## 2022-10-01 DIAGNOSIS — N189 Chronic kidney disease, unspecified: Secondary | ICD-10-CM | POA: Insufficient documentation

## 2022-10-01 DIAGNOSIS — Z90710 Acquired absence of both cervix and uterus: Secondary | ICD-10-CM | POA: Diagnosis not present

## 2022-10-01 DIAGNOSIS — I129 Hypertensive chronic kidney disease with stage 1 through stage 4 chronic kidney disease, or unspecified chronic kidney disease: Secondary | ICD-10-CM | POA: Diagnosis not present

## 2022-10-01 DIAGNOSIS — Z803 Family history of malignant neoplasm of breast: Secondary | ICD-10-CM | POA: Insufficient documentation

## 2022-10-01 LAB — CBC
HCT: 21.2 % — ABNORMAL LOW (ref 36.0–46.0)
Hemoglobin: 6.6 g/dL — CL (ref 12.0–15.0)
MCH: 24.4 pg — ABNORMAL LOW (ref 26.0–34.0)
MCHC: 31.1 g/dL (ref 30.0–36.0)
MCV: 78.5 fL — ABNORMAL LOW (ref 80.0–100.0)
Platelets: 93 10*3/uL — ABNORMAL LOW (ref 150–400)
RBC: 2.7 MIL/uL — ABNORMAL LOW (ref 3.87–5.11)
RDW: 22.1 % — ABNORMAL HIGH (ref 11.5–15.5)
WBC: 2.8 10*3/uL — ABNORMAL LOW (ref 4.0–10.5)
nRBC: 0 % (ref 0.0–0.2)

## 2022-10-01 LAB — PREPARE RBC (CROSSMATCH)

## 2022-10-01 MED ORDER — SODIUM CHLORIDE 0.9% FLUSH
10.0000 mL | INTRAVENOUS | Status: AC
  Administered 2022-10-01: 10 mL

## 2022-10-01 MED ORDER — HEPARIN SOD (PORK) LOCK FLUSH 100 UNIT/ML IV SOLN
500.0000 [IU] | Freq: Once | INTRAVENOUS | Status: AC
  Administered 2022-10-01: 500 [IU] via INTRAVENOUS

## 2022-10-01 NOTE — Progress Notes (Signed)
Patient refused Retacrit injection. Patient states the injection put her in the hospital per patient's words. Patient teaching performed. Understanding verbalized by patient. [place] PA notified by charge RN.

## 2022-10-01 NOTE — Patient Instructions (Signed)
[facility]  Discharge Instructions: Thank you for choosing [facility] to provide your oncology and hematology care.  If you have a lab appointment with the [facility], please come in thru the Main Entrance and check in at the main information desk.  Wear comfortable clothing and clothing appropriate for easy access to any Portacath or PICC line.   We strive to give you quality time with your provider. You may need to reschedule your appointment if you arrive late (15 or more minutes).  Arriving late affects you and other patients whose appointments are after yours.  Also, if you miss three or more appointments without notifying the office, you may be dismissed from the clinic at the provider's discretion.      For prescription refill requests, have your pharmacy contact our office and allow 72 hours for refills to be completed.    Today you received the following chemotherapy and/or immunotherapy agents Picc flush and lab draw.       To help prevent nausea and vomiting after your treatment, we encourage you to take your nausea medication as directed.  BELOW ARE SYMPTOMS THAT SHOULD BE REPORTED IMMEDIATELY: *FEVER GREATER THAN 100.4 F (38 C) OR HIGHER *CHILLS OR SWEATING *NAUSEA AND VOMITING THAT IS NOT CONTROLLED WITH YOUR NAUSEA MEDICATION *UNUSUAL SHORTNESS OF BREATH *UNUSUAL BRUISING OR BLEEDING *URINARY PROBLEMS (pain or burning when urinating, or frequent urination) *BOWEL PROBLEMS (unusual diarrhea, constipation, pain near the anus) TENDERNESS IN MOUTH AND THROAT WITH OR WITHOUT PRESENCE OF ULCERS (sore throat, sores in mouth, or a toothache) UNUSUAL RASH, SWELLING OR PAIN  UNUSUAL VAGINAL DISCHARGE OR ITCHING   Items with * indicate a potential emergency and should be followed up as soon as possible or go to the Emergency Department if any problems should occur.  Please show the CHEMOTHERAPY ALERT CARD or IMMUNOTHERAPY ALERT CARD at  check-in to the Emergency Department and triage nurse.  Should you have questions after your visit or need to cancel or reschedule your appointment, please contact [facility] [phone]  and follow the prompts.  Office hours are 8:00 a.m. to 4:30 p.m. Monday - Friday. Please note that voicemails left after 4:00 p.m. may not be returned until the following business day.  We are closed weekends and major holidays. You have access to a nurse at all times for urgent questions. Please call the main number to the clinic [phone] and follow the prompts.  For any non-urgent questions, you may also contact your provider using MyChart. We now offer e-Visits for anyone 60 and older to request care online for non-urgent symptoms. For details visit [email or web address].   Also download the MyChart app! Go to the app store, search "MyChart", open the app, select [facility], and log in with your MyChart username and password.

## 2022-10-01 NOTE — Progress Notes (Signed)
CRITICAL VALUE ALERT Critical value received:  hgb 6.6 Date of notification:  10-01-22 Time of notification: [id number] Critical value read back:  Yes.   Nurse who received alert:  C. Ellijah Leffel RN MD notified time and response:  Reb. Pennington PA-C. [id number], will give 2 units of blood tomorrow per orders.

## 2022-10-01 NOTE — Addendum Note (Signed)
Addended by: Donnie Aho on: 10/01/2022 01:39 PM   Modules accepted: Orders

## 2022-10-01 NOTE — Progress Notes (Signed)
Rachel Huber presented for PICC flush and lab draw.  See IV assessment in docflowsheets for PICC details. PICC located left arm.  Good blood return present. PICC flushed with 78m NS and 250U Heparin, see MAR for further details.  Rachel Huber tolerated procedure well and without incident. No complaints at this time. Discharged from clinic by wheel chair in stable condition. Alert and oriented x 3. [facility]

## 2022-10-02 ENCOUNTER — Inpatient Hospital Stay: Payer: 59

## 2022-10-02 DIAGNOSIS — D500 Iron deficiency anemia secondary to blood loss (chronic): Secondary | ICD-10-CM

## 2022-10-02 MED ORDER — DIPHENHYDRAMINE HCL 25 MG PO CAPS: 25.0000 mg | ORAL_CAPSULE | Freq: Once | ORAL | Status: DC

## 2022-10-02 MED ORDER — SODIUM CHLORIDE 0.9% IV SOLUTION
250.0000 mL | Freq: Once | INTRAVENOUS | Status: AC
  Administered 2022-10-02: 250 mL via INTRAVENOUS

## 2022-10-02 MED ORDER — HEPARIN SOD (PORK) LOCK FLUSH 100 UNIT/ML IV SOLN
500.0000 [IU] | Freq: Every day | INTRAVENOUS | Status: AC | PRN
  Administered 2022-10-02: 500 [IU]

## 2022-10-02 MED ORDER — SODIUM CHLORIDE 0.9% FLUSH
10.0000 mL | INTRAVENOUS | Status: AC | PRN
  Administered 2022-10-02: 10 mL

## 2022-10-02 MED ORDER — ACETAMINOPHEN 325 MG PO TABS: 650.0000 mg | ORAL_TABLET | Freq: Once | ORAL | Status: DC

## 2022-10-02 NOTE — Progress Notes (Signed)
Patient presents today for 2 units of blood. Vital signs stable.   2 units of blood given today per MD orders. Tolerated infusion without adverse affects. Vital signs stable. No complaints at this time. Discharged from clinic by wheel chair in stable condition. Alert and oriented x 3. F/U with [facility] as scheduled.

## 2022-10-02 NOTE — Patient Instructions (Signed)
[facility]  Discharge Instructions: Thank you for choosing [facility] to provide your oncology and hematology care.  If you have a lab appointment with the [facility], please come in thru the Main Entrance and check in at the main information desk.  Wear comfortable clothing and clothing appropriate for easy access to any Portacath or PICC line.   We strive to give you quality time with your provider. You may need to reschedule your appointment if you arrive late (15 or more minutes).  Arriving late affects you and other patients whose appointments are after yours.  Also, if you miss three or more appointments without notifying the office, you may be dismissed from the clinic at the provider's discretion.      For prescription refill requests, have your pharmacy contact our office and allow 72 hours for refills to be completed.    Today you received the following chemotherapy and/or immunotherapy agents 2 units of blood.      To help prevent nausea and vomiting after your treatment, we encourage you to take your nausea medication as directed.  BELOW ARE SYMPTOMS THAT SHOULD BE REPORTED IMMEDIATELY: *FEVER GREATER THAN 100.4 F (38 C) OR HIGHER *CHILLS OR SWEATING *NAUSEA AND VOMITING THAT IS NOT CONTROLLED WITH YOUR NAUSEA MEDICATION *UNUSUAL SHORTNESS OF BREATH *UNUSUAL BRUISING OR BLEEDING *URINARY PROBLEMS (pain or burning when urinating, or frequent urination) *BOWEL PROBLEMS (unusual diarrhea, constipation, pain near the anus) TENDERNESS IN MOUTH AND THROAT WITH OR WITHOUT PRESENCE OF ULCERS (sore throat, sores in mouth, or a toothache) UNUSUAL RASH, SWELLING OR PAIN  UNUSUAL VAGINAL DISCHARGE OR ITCHING   Items with * indicate a potential emergency and should be followed up as soon as possible or go to the Emergency Department if any problems should occur.  Please show the CHEMOTHERAPY ALERT CARD or IMMUNOTHERAPY ALERT CARD at check-in to the  Emergency Department and triage nurse.  Should you have questions after your visit or need to cancel or reschedule your appointment, please contact [facility] [phone]  and follow the prompts.  Office hours are 8:00 a.m. to 4:30 p.m. Monday - Friday. Please note that voicemails left after 4:00 p.m. may not be returned until the following business day.  We are closed weekends and major holidays. You have access to a nurse at all times for urgent questions. Please call the main number to the clinic [phone] and follow the prompts.  For any non-urgent questions, you may also contact your provider using MyChart. We now offer e-Visits for anyone 2 and older to request care online for non-urgent symptoms. For details visit [email or web address].   Also download the MyChart app! Go to the app store, search "MyChart", open the app, select [facility], and log in with your MyChart username and password.

## 2022-10-03 LAB — BPAM RBC
Blood Product Expiration Date: 202403102359
Blood Product Expiration Date: 202403102359
ISSUE DATE / TIME: 202402151009
ISSUE DATE / TIME: 202402151201
Unit Type and Rh: 6200
Unit Type and Rh: 6200

## 2022-10-03 LAB — TYPE AND SCREEN
ABO/RH(D): A POS
Antibody Screen: NEGATIVE
Unit division: 0
Unit division: 0

## 2022-10-06 ENCOUNTER — Ambulatory Visit: Payer: 59

## 2022-10-06 DIAGNOSIS — K7469 Other cirrhosis of liver: Secondary | ICD-10-CM | POA: Diagnosis not present

## 2022-10-06 DIAGNOSIS — K642 Third degree hemorrhoids: Secondary | ICD-10-CM | POA: Diagnosis not present

## 2022-10-06 DIAGNOSIS — K649 Unspecified hemorrhoids: Secondary | ICD-10-CM | POA: Diagnosis not present

## 2022-10-06 DIAGNOSIS — R188 Other ascites: Secondary | ICD-10-CM | POA: Diagnosis not present

## 2022-10-07 DIAGNOSIS — Z95828 Presence of other vascular implants and grafts: Secondary | ICD-10-CM | POA: Diagnosis not present

## 2022-10-07 DIAGNOSIS — R188 Other ascites: Secondary | ICD-10-CM | POA: Diagnosis not present

## 2022-10-07 DIAGNOSIS — K766 Portal hypertension: Secondary | ICD-10-CM | POA: Diagnosis not present

## 2022-10-07 DIAGNOSIS — Z01818 Encounter for other preprocedural examination: Secondary | ICD-10-CM | POA: Diagnosis not present

## 2022-10-07 DIAGNOSIS — K7469 Other cirrhosis of liver: Secondary | ICD-10-CM | POA: Diagnosis not present

## 2022-10-08 ENCOUNTER — Inpatient Hospital Stay: Payer: 59

## 2022-10-08 ENCOUNTER — Ambulatory Visit
Admission: RE | Admit: 2022-10-08 | Discharge: 2022-10-08 | Disposition: A | Discharge status: Home / Self Care | Payer: 59 | Source: Ambulatory Visit | Attending: Gastroenterology | Admitting: Gastroenterology

## 2022-10-08 DIAGNOSIS — R188 Other ascites: Secondary | ICD-10-CM | POA: Diagnosis not present

## 2022-10-08 DIAGNOSIS — K7460 Unspecified cirrhosis of liver: Secondary | ICD-10-CM | POA: Diagnosis not present

## 2022-10-08 DIAGNOSIS — K7581 Nonalcoholic steatohepatitis (NASH): Secondary | ICD-10-CM | POA: Insufficient documentation

## 2022-10-08 MED ORDER — HEPARIN SOD (PORK) LOCK FLUSH 100 UNIT/ML IV SOLN
INTRAVENOUS | Status: AC
  Filled 2022-10-08: qty 5

## 2022-10-08 MED ORDER — LIDOCAINE HCL (PF) 1 % IJ SOLN
10.0000 mL | Freq: Once | INTRAMUSCULAR | Status: AC
  Administered 2022-10-08: 10 mL via INTRADERMAL

## 2022-10-08 MED ORDER — ALBUMIN HUMAN 25 % IV SOLN: 25.0000 g | Freq: Once | INTRAVENOUS | Status: AC

## 2022-10-08 MED ORDER — ALBUMIN HUMAN 25 % IV SOLN: 25.0000 g | Freq: Once | INTRAVENOUS | Status: DC

## 2022-10-08 MED ORDER — ALBUMIN HUMAN 25 % IV SOLN
INTRAVENOUS | Status: AC
  Administered 2022-10-08: 25 g via INTRAVENOUS
  Filled 2022-10-08: qty 200

## 2022-10-08 NOTE — Procedures (Signed)
PROCEDURE SUMMARY:  Successful image-guided paracentesis from the left lower abdomen.  Yielded 6 liters of yellow fluid.  No immediate complications.  EBL = trace. Patient tolerated well.   Specimen was not sent for labs.  Please see imaging section of Epic for full dictation.   Lura Em PA-C 10/08/2022 12:14 PM

## 2022-10-09 ENCOUNTER — Inpatient Hospital Stay: Payer: 59

## 2022-10-09 DIAGNOSIS — Z124 Encounter for screening for malignant neoplasm of cervix: Secondary | ICD-10-CM | POA: Diagnosis not present

## 2022-10-09 DIAGNOSIS — D500 Iron deficiency anemia secondary to blood loss (chronic): Secondary | ICD-10-CM

## 2022-10-09 DIAGNOSIS — Z01419 Encounter for gynecological examination (general) (routine) without abnormal findings: Secondary | ICD-10-CM | POA: Diagnosis not present

## 2022-10-09 LAB — CBC
HCT: 22 % — ABNORMAL LOW (ref 36.0–46.0)
Hemoglobin: 6.9 g/dL — CL (ref 12.0–15.0)
MCH: 24.8 pg — ABNORMAL LOW (ref 26.0–34.0)
MCHC: 31.4 g/dL (ref 30.0–36.0)
MCV: 79.1 fL — ABNORMAL LOW (ref 80.0–100.0)
Platelets: 88 10*3/uL — ABNORMAL LOW (ref 150–400)
RBC: 2.78 MIL/uL — ABNORMAL LOW (ref 3.87–5.11)
RDW: 22.5 % — ABNORMAL HIGH (ref 11.5–15.5)
WBC: 3.1 10*3/uL — ABNORMAL LOW (ref 4.0–10.5)
nRBC: 0 % (ref 0.0–0.2)

## 2022-10-09 LAB — PREPARE RBC (CROSSMATCH)

## 2022-10-09 MED ORDER — SODIUM CHLORIDE 0.9% FLUSH
10.0000 mL | INTRAVENOUS | Status: DC | PRN
  Administered 2022-10-09: 10 mL

## 2022-10-09 MED ORDER — HEPARIN SOD (PORK) LOCK FLUSH 100 UNIT/ML IV SOLN: 500.0000 [IU] | Freq: Once | INTRAVENOUS | Status: DC

## 2022-10-09 MED ORDER — HEPARIN SOD (PORK) LOCK FLUSH 100 UNIT/ML IV SOLN
250.0000 [IU] | Freq: Once | INTRAVENOUS | Status: AC
  Administered 2022-10-09: 250 [IU] via INTRAVENOUS

## 2022-10-09 NOTE — Progress Notes (Signed)
Pt presents today for PICC flush and lab draw per provider's order. PICC flushed with 10 mL of normal saline and 250 units of heparin. Good blood return noted and no bruising or swelling noted at the site. See IV assessment in flowsheets for PICC details.  Discharged from clinic via wheelchair in stable condition. Alert and oriented x 3. F/U with [facility] as scheduled.

## 2022-10-09 NOTE — Progress Notes (Signed)
CRITICAL VALUE ALERT Critical value received:  HGB 6.9 Date of notification:  10-09-2022 Time of notification: 1156 Critical value read back:  Yes.   Nurse who received alert:  Bpresnell RN MD notified time and response:  Katragadda @ 1248. Patient to receive 1 unit of blood tomorrow.  Orders received to give 2 units of blood tomorrow.

## 2022-10-10 ENCOUNTER — Inpatient Hospital Stay: Payer: 59

## 2022-10-10 DIAGNOSIS — D500 Iron deficiency anemia secondary to blood loss (chronic): Secondary | ICD-10-CM | POA: Diagnosis not present

## 2022-10-10 MED ORDER — DIPHENHYDRAMINE HCL 25 MG PO CAPS
25.0000 mg | ORAL_CAPSULE | Freq: Once | ORAL | Status: AC
  Administered 2022-10-10: 25 mg via ORAL
  Filled 2022-10-10: qty 1

## 2022-10-10 MED ORDER — HEPARIN SOD (PORK) LOCK FLUSH 100 UNIT/ML IV SOLN
500.0000 [IU] | Freq: Every day | INTRAVENOUS | Status: AC | PRN
  Administered 2022-10-10: 500 [IU]

## 2022-10-10 MED ORDER — SODIUM CHLORIDE 0.9% FLUSH
10.0000 mL | INTRAVENOUS | Status: AC | PRN
  Administered 2022-10-10: 10 mL

## 2022-10-10 MED ORDER — SODIUM CHLORIDE 0.9% IV SOLUTION
250.0000 mL | Freq: Once | INTRAVENOUS | Status: AC
  Administered 2022-10-10: 250 mL via INTRAVENOUS

## 2022-10-10 MED ORDER — ACETAMINOPHEN 325 MG PO TABS
650.0000 mg | ORAL_TABLET | Freq: Once | ORAL | Status: AC
  Administered 2022-10-10: 650 mg via ORAL
  Filled 2022-10-10: qty 2

## 2022-10-10 NOTE — Patient Instructions (Signed)
[facility]  Discharge Instructions: Thank you for choosing [facility] to provide your oncology and hematology care.  If you have a lab appointment with the [facility], please come in thru the Main Entrance and check in at the main information desk.  Wear comfortable clothing and clothing appropriate for easy access to any Portacath or PICC line.   We strive to give you quality time with your provider. You may need to reschedule your appointment if you arrive late (15 or more minutes).  Arriving late affects you and other patients whose appointments are after yours.  Also, if you miss three or more appointments without notifying the office, you may be dismissed from the clinic at the provider's discretion.      For prescription refill requests, have your pharmacy contact our office and allow 72 hours for refills to be completed.    Today you received the following chemotherapy and/or immunotherapy agents 2 UPRBC      To help prevent nausea and vomiting after your treatment, we encourage you to take your nausea medication as directed.  BELOW ARE SYMPTOMS THAT SHOULD BE REPORTED IMMEDIATELY: *FEVER GREATER THAN 100.4 F (38 C) OR HIGHER *CHILLS OR SWEATING *NAUSEA AND VOMITING THAT IS NOT CONTROLLED WITH YOUR NAUSEA MEDICATION *UNUSUAL SHORTNESS OF BREATH *UNUSUAL BRUISING OR BLEEDING *URINARY PROBLEMS (pain or burning when urinating, or frequent urination) *BOWEL PROBLEMS (unusual diarrhea, constipation, pain near the anus) TENDERNESS IN MOUTH AND THROAT WITH OR WITHOUT PRESENCE OF ULCERS (sore throat, sores in mouth, or a toothache) UNUSUAL RASH, SWELLING OR PAIN  UNUSUAL VAGINAL DISCHARGE OR ITCHING   Items with * indicate a potential emergency and should be followed up as soon as possible or go to the Emergency Department if any problems should occur.  Please show the CHEMOTHERAPY ALERT CARD or IMMUNOTHERAPY ALERT CARD at check-in to the Emergency  Department and triage nurse.  Should you have questions after your visit or need to cancel or reschedule your appointment, please contact [facility] [phone]  and follow the prompts.  Office hours are 8:00 a.m. to 4:30 p.m. Monday - Friday. Please note that voicemails left after 4:00 p.m. may not be returned until the following business day.  We are closed weekends and major holidays. You have access to a nurse at all times for urgent questions. Please call the main number to the clinic [phone] and follow the prompts.  For any non-urgent questions, you may also contact your provider using MyChart. We now offer e-Visits for anyone 68 and older to request care online for non-urgent symptoms. For details visit [email or web address].   Also download the MyChart app! Go to the app store, search "MyChart", open the app, select [facility], and log in with your MyChart username and password.

## 2022-10-10 NOTE — Progress Notes (Signed)
Patient presents today for 2 [facility] per providers order.  Vital signs WNL.  Patient has no new complaints at this time.  Stable during infusion without adverse affects.  Vital signs stable.  No complaints at this time.  Discharge from clinic via wheelchair in stable condition.  Alert and oriented X 3.  Follow up with [facility] as scheduled.

## 2022-10-12 LAB — TYPE AND SCREEN
ABO/RH(D): A POS
Antibody Screen: NEGATIVE
Unit division: 0
Unit division: 0

## 2022-10-12 LAB — BPAM RBC
Blood Product Expiration Date: 202403132359
Blood Product Expiration Date: 202403132359
ISSUE DATE / TIME: 202402231021
ISSUE DATE / TIME: 202402231142
Unit Type and Rh: 6200
Unit Type and Rh: 6200

## 2022-10-13 ENCOUNTER — Ambulatory Visit
Admission: RE | Admit: 2022-10-13 | Discharge: 2022-10-13 | Disposition: A | Discharge status: Home / Self Care | Payer: 59 | Source: Ambulatory Visit | Attending: Interventional Radiology | Admitting: Interventional Radiology

## 2022-10-13 DIAGNOSIS — K766 Portal hypertension: Secondary | ICD-10-CM

## 2022-10-13 DIAGNOSIS — K7460 Unspecified cirrhosis of liver: Secondary | ICD-10-CM

## 2022-10-13 HISTORY — PX: IR RADIOLOGIST EVAL & MGMT: IMG5224

## 2022-10-13 NOTE — Progress Notes (Signed)
Referring Physician(s): Raylene Miyamoto, MD  Reason for follow up:  1 month after superior rectal artery embolization, presenting via virtual telephone visit  History of present illness: Mrs. 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  She underwent "emborrhoid" procedure on 02/07/22 which significantly improved her rectal bleeding initially which gradually returned.  She underwent repeat angiogram and embolization of the duplicated bilateral superior rectal arteries on 09/05/22 due to persistent anemia requiring transfusions.  She was recently diagnosed with thalassemia.  Since the procedure she was admitted once with bacteriemia attributed to portacath infection which has been removed.  She received 2 U PRBC on 10/01/22.  During the most of her most recent hospitalization using bedside commode had no blood.  Did have several days of small volume blood toward the end of hospitalization and several days after returning home but that stopped.  None in the past week.  She reports overall feeling well, and her general sense is that the rectal bleeding has improved some but it may be too early to tell.  She has an appointment for follow up after TIPS at [facility] next month.  She is getting closer to being on the liver transplant list there and hopeful for this.    Past Medical History:  Diagnosis Date   Anemia in chronic kidney disease (CKD) 02/07/2022   Anxiety    Arthritis    Cirrhosis of liver ([place])    Diabetes mellitus without complication (HCC)    Dyspnea    DOE   GERD (gastroesophageal reflux disease)    Grade IV internal hemorrhoids    Contingency plan for any future admissions for severe anemia in the setting of persistent GI bleed:  nuclear medicine tagged RBC scan to assess for location of GI  bleeding - if from rectum, would then proceed to angiogram with possible repeat embolization.  Please notify IR in this case.   Hepatitis    PAST   Hypertension    Neuropathy    Neuropathy, diabetic ([place])    Pneumonia    Sinus complaint    Sleep apnea    CPAP   Thalassemia minor 1992    Past Surgical History:  Procedure Laterality Date   ABDOMINAL HYSTERECTOMY     BREAST BIOPSY Right 02/15/2018   [place] bx 6-6:30 ribbon shape, ONE CORE FRAGMENT WITH FIBROSIS. ONE CORE FRAGMENT WITH PORTION OF A DILATED   BREAST BIOPSY Right 02/15/2018   [place] bx 9:00 heart shape, USUAL DUCTAL HYPERPLASIA   BREAST LUMPECTOMY Right 03/09/2018   Procedure: BREAST LUMPECTOMY x 2;  Surgeon: Benjamine Sprague, DO;  Location: [facility] ORS;  Service: General;  Laterality: Right;   CATARACT EXTRACTION W/PHACO Left 06/11/2016   Procedure: CATARACT EXTRACTION PHACO AND INTRAOCULAR LENS PLACEMENT (IOC);  Surgeon: Estill Cotta, MD;  Location: [facility] ORS;  Service: Ophthalmology;  Laterality: Left;  Lot # [id number] H US:01:38.6 AP%:26.4 CDE:44.15   CATARACT EXTRACTION W/PHACO Right 06/15/2018   Procedure: CATARACT EXTRACTION PHACO AND INTRAOCULAR LENS PLACEMENT (IOC);  Surgeon: Birder Robson, MD;  Location: [facility] ORS;  Service: Ophthalmology;  Laterality: Right;  [place] 00:38.2 CDE 4.23 Fluid Pack Lot # [id number]   COLONOSCOPIES     COLONOSCOPY WITH PROPOFOL N/A 10/10/2020   Procedure: COLONOSCOPY WITH PROPOFOL;  Surgeon: Lesly Rubenstein, MD;  Location: [facility] ENDOSCOPY;  Service: Endoscopy;  Laterality: N/A;  COLONOSCOPY WITH PROPOFOL N/A 11/20/2020   Procedure: COLONOSCOPY WITH PROPOFOL;  Surgeon: Lesly Rubenstein, MD;  Location: [facility] ENDOSCOPY;  Service: Endoscopy;  Laterality: N/A;  DM STAT CBC, BMP COVID POSITIVE 09/02/2020   COLONOSCOPY WITH PROPOFOL N/A 06/19/2022   Procedure: COLONOSCOPY WITH PROPOFOL;  Surgeon: Lesly Rubenstein, MD;  Location: [facility] ENDOSCOPY;  Service: Endoscopy;  Laterality: N/A;   CTR      ESOPHAGOGASTRODUODENOSCOPY (EGD) WITH PROPOFOL N/A 10/10/2020   Procedure: ESOPHAGOGASTRODUODENOSCOPY (EGD) WITH PROPOFOL;  Surgeon: Lesly Rubenstein, MD;  Location: [facility] ENDOSCOPY;  Service: Endoscopy;  Laterality: N/A;  COVID POSITIVE 10/08/2020   FLEXIBLE SIGMOIDOSCOPY N/A 02/06/2022   Procedure: FLEXIBLE SIGMOIDOSCOPY;  Surgeon: Lesly Rubenstein, MD;  Location: [facility] ENDOSCOPY;  Service: Endoscopy;  Laterality: N/A;  Patient requests anesthesia   IR ANGIOGRAM SELECTIVE EACH ADDITIONAL VESSEL  02/07/2022   IR ANGIOGRAM SELECTIVE EACH ADDITIONAL VESSEL  02/07/2022   IR ANGIOGRAM SELECTIVE EACH ADDITIONAL VESSEL  09/05/2022   IR ANGIOGRAM VISCERAL SELECTIVE  02/07/2022   IR ANGIOGRAM VISCERAL SELECTIVE  09/05/2022   IR EMBO ART  VEN HEMORR LYMPH EXTRAV  INC GUIDE ROADMAPPING  09/05/2022   IR EMBO ARTERIAL NOT HEMORR HEMANG INC GUIDE ROADMAPPING  02/07/2022   IR IMAGING GUIDED PORT INSERTION  06/13/2022   IR PARACENTESIS  12/17/2021   IR PARACENTESIS  03/25/2022   IR RADIOLOGIST EVAL & MGMT  03/18/2022   IR RADIOLOGIST EVAL & MGMT  05/07/2022   IR RADIOLOGIST EVAL & MGMT  05/16/2022   IR US GUIDE VASC ACCESS RIGHT  02/07/2022   IR US GUIDE VASC ACCESS RIGHT  09/05/2022   JOINT REPLACEMENT     KNEE SURGERY Right 09/24/2017   plates and pins   PORT-A-CATH REMOVAL N/A 09/23/2022   Procedure: MINOR REMOVAL PORT-A-CATH;  Surgeon: Aviva Signs, MD;  Location: [facility] ORS;  Service: General;  Laterality: N/A;   TEE WITHOUT CARDIOVERSION N/A 09/23/2022   Procedure: TRANSESOPHAGEAL ECHOCARDIOGRAM (TEE);  Surgeon: Arnoldo Lenis, MD;  Location: [facility] ORS;  Service: Endoscopy;  Laterality: N/A;   TOTAL SHOULDER ARTHROPLASTY Right 09/27/2015    Allergies: Gramineae pollens and Latex  Medications: Prior to Admission medications   Medication Sig Start Date End Date Taking? Authorizing Provider  acetaminophen (TYLENOL) 650 MG CR tablet Take 1,300 mg by mouth daily as needed for pain.    [provider]   acidophilus (RISAQUAD) CAPS capsule Take 2 capsules by mouth 3 (three) times daily. 09/24/22 10/24/22  Manuella Ghazi, Pratik D, DO  albuterol (VENTOLIN HFA) 108 (90 Base) MCG/ACT inhaler Inhale 2 puffs into the lungs every 6 (six) hours as needed for wheezing or shortness of breath. 02/10/22   Loletha Grayer, MD  atorvastatin (LIPITOR) 10 MG tablet Take 1 tablet (10 mg total) by mouth daily. 06/20/22   Fritzi Mandes, MD  diphenhydrAMINE (BENADRYL) 25 MG tablet Take 25 mg by mouth 2 (two) times daily as needed for itching.    [provider]  ergocalciferol (VITAMIN D2) 1.25 MG (50000 UT) capsule Take 1 capsule (50,000 Units total) by mouth every 14 (fourteen) days. 09/01/22   Harriett Rush, PA-C  furosemide (LASIX) 40 MG tablet Take 40 mg by mouth daily. 06/24/22 06/24/23  [provider]  gabapentin (NEURONTIN) 400 MG capsule Take 800 mg by mouth 2 (two) times daily. 01/07/18   [provider]  hydrocortisone (ANUCORT-HC) 25 MG suppository Place 1 suppository (25 mg total) rectally 2 (two) times daily as needed for anal itching or hemorrhoids. 06/20/22  Fritzi Mandes, MD  hydrocortisone (ANUSOL-HC) 2.5 % rectal cream Place rectally 4 (four) times daily. 03/07/22   Loletha Grayer, MD  lactulose, encephalopathy, (CHRONULAC) 10 GM/15ML SOLN Take 30 mLs (20 g total) by mouth daily. 01/30/22   Roxan Hockey, MD  lidocaine-prilocaine (EMLA) cream Apply 1 Application topically See admin instructions for 1 dose. Apply to port site 1 hour prior to procedure 06/23/22   Narda Rutherford T, NP  melatonin 5 MG TABS Take 10 mg by mouth at bedtime as needed (sleep).    [provider]  midodrine (PROAMATINE) 10 MG tablet Take 1 tablet (10 mg total) by mouth 3 (three) times daily with meals. 09/24/22 10/24/22  Manuella Ghazi, Pratik D, DO  midodrine (PROAMATINE) 10 MG tablet Take by mouth. 09/24/22 10/24/22  [provider]  Multiple Vitamins-Minerals (MULTIVITAMIN WITH MINERALS) tablet Take 1 tablet by  mouth daily.    [provider]  polyethylene glycol (MIRALAX / GLYCOLAX) 17 g packet Take 17 g by mouth 2 (two) times daily. 06/20/22   Fritzi Mandes, MD  sertraline (ZOLOFT) 50 MG tablet Take 50 mg by mouth daily. 12/12/17   [provider]  vitamin B-12 (CYANOCOBALAMIN) 1000 MCG tablet Take 1,000 mcg by mouth daily.    [provider]  witch hazel-glycerin (TUCKS) pad Apply topically as needed for hemorrhoids. 03/07/22   Loletha Grayer, MD     Family History  Problem Relation Age of Onset   Breast cancer Mother 10   Lymphoma Mother    Diabetes Father    Kidney cancer Father    Heart disease Father    Diabetes Sister    Breast cancer Sister 20    Social History   Socioeconomic History   Marital status: Married    Spouse name: Not on file   Number of children: 0   Years of education: Not on file   Highest education level: Not on file  Occupational History   Occupation: EMPLOYED  Tobacco Use   Smoking status: Never   Smokeless tobacco: Never  Vaping Use   Vaping Use: Never used  Substance and Sexual Activity   Alcohol use: No   Drug use: Never   Sexual activity: Not Currently  Other Topics Concern   Not on file  Social History Narrative   Not on file   Social Determinants of Health   Financial Resource Strain: Low Risk  (04/04/2022)   Overall Financial Resource Strain (CARDIA)    Difficulty of Paying Living Expenses: Not hard at all  Food Insecurity: No Food Insecurity (09/18/2022)   Hunger Vital Sign    Worried About Running Out of Food in the Last Year: Never true    [place] in the Last Year: Never true  Transportation Needs: No Transportation Needs (09/18/2022)   PRAPARE - Hydrologist (Medical): No    Lack of Transportation (Non-Medical): No  Physical Activity: Inactive (04/04/2022)   Exercise Vital Sign    Days of Exercise per Week: 0 days    Minutes of Exercise per Session: 0 min  Stress: No Stress  Concern Present (04/04/2022)   [place]    Feeling of Stress : Only a little  Social Connections: Moderately Isolated (04/04/2022)   Social Connection and Isolation Panel [NHANES]    Frequency of Communication with Friends and Family: More than three times a week    Frequency of Social Gatherings with Friends and Family:  Three times a week    Attends Religious Services: Never    Active Member of Clubs or Organizations: No    Attends Archivist Meetings: Never    Marital Status: Married     Vital Signs: There were no vitals taken for this visit.  No physical examination was performed in lieu of virtual telephone clinic visit.   Imaging: Angio 09/05/22  Pre   Post  Labs:  CBC: Recent Labs    09/23/22 0423 09/24/22 0500 10/01/22 1154 10/09/22 1121  WBC 5.0 3.6* 2.8* 3.1*  HGB 8.1* 7.9* 6.6* 6.9*  HCT 26.2* 25.3* 21.2* 22.0*  PLT 129* 112* 93* 88*    COAGS: Recent Labs    03/22/22 0206 06/20/22 0626 09/05/22 1108 09/17/22 2111 09/18/22 1253 09/19/22 0359  INR 1.3* 1.3* 1.3* 1.4* 1.4*  --   APTT 30  --   --  34 35 36    BMP: Recent Labs    09/20/22 0508 09/21/22 0455 09/23/22 0423 09/24/22 0500  NA 136 134* 136 136  K 3.8 3.7 3.8 3.3*  CL 109 107 109 107  CO2 21* 20* 22 22  GLUCOSE 155* 123* 166* 136*  BUN 37* 35* 32* 31*  CALCIUM 7.5* 7.6* 7.8* 7.7*  CREATININE 1.70* 1.52* 1.45* 1.41*  GFRNONAA 33* 38* 40* 42*    LIVER FUNCTION TESTS: Recent Labs    09/17/22 2111 09/19/22 0359 09/20/22 0508 09/21/22 0455  BILITOT 3.0* 1.8* 1.8* 1.7*  AST 53* 48* 44* 41  ALT '23 20 21 22  '$ ALKPHOS 98 76 83 89  PROT 4.8* 4.3* 4.2* 4.4*  ALBUMIN 2.6* 2.2* 2.1* 2.1*    Assessment and Plan: Rachel Huber is a 67 year old female with 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  She underwent "emborrhoid" procedure on 02/07/22 which significantly improved her rectal bleeding initially which gradually returned.  She underwent repeat angiogram and embolization of the duplicated bilateral superior rectal arteries on 09/05/22 due to persistent anemia requiring transfusions.  It seems that the bleeding has slowed significantly, though may be too early to tell given her medical complexity.  Plan for 2 month clinic follow up, no imaging required.  Electronically Signed: Suzette Battiest 10/13/2022, 9:56 AM   I spent a total of 25 Minutes in virtual telephone clinical consultation, greater than 50% of which was counseling/coordinating care for internal hemorrhoids.

## 2022-10-14 ENCOUNTER — Ambulatory Visit
Admission: RE | Admit: 2022-10-14 | Discharge: 2022-10-14 | Disposition: A | Discharge status: Home / Self Care | Payer: 59 | Source: Ambulatory Visit | Attending: Gastroenterology | Admitting: Gastroenterology

## 2022-10-14 DIAGNOSIS — K7581 Nonalcoholic steatohepatitis (NASH): Secondary | ICD-10-CM | POA: Insufficient documentation

## 2022-10-14 DIAGNOSIS — K7469 Other cirrhosis of liver: Secondary | ICD-10-CM | POA: Diagnosis not present

## 2022-10-14 DIAGNOSIS — K7460 Unspecified cirrhosis of liver: Secondary | ICD-10-CM | POA: Insufficient documentation

## 2022-10-14 DIAGNOSIS — R188 Other ascites: Secondary | ICD-10-CM | POA: Diagnosis not present

## 2022-10-14 MED ORDER — ALBUMIN HUMAN 25 % IV SOLN
INTRAVENOUS | Status: AC
  Administered 2022-10-14: 25 g via INTRAVENOUS
  Filled 2022-10-14: qty 200

## 2022-10-14 MED ORDER — LIDOCAINE HCL (PF) 1 % IJ SOLN
10.0000 mL | Freq: Once | INTRAMUSCULAR | Status: AC
  Administered 2022-10-14: 10 mL via INTRADERMAL
  Filled 2022-10-14: qty 10

## 2022-10-14 MED ORDER — ALBUMIN HUMAN 25 % IV SOLN: 25.0000 g | Freq: Once | INTRAVENOUS | Status: AC

## 2022-10-14 MED ORDER — ALBUMIN HUMAN 25 % IV SOLN
25.0000 g | Freq: Once | INTRAVENOUS | Status: AC
  Administered 2022-10-14: 25 g via INTRAVENOUS

## 2022-10-14 NOTE — Progress Notes (Signed)
[name], [place] [address]   CLINIC:  Medical Oncology/Hematology  PCP:  [name], MD [place] [address] [phone]   REASON FOR VISIT:  Follow-up for iron deficiency anemia and thalassemia    CURRENT THERAPY: Iron infusions and blood transfusions as needed  INTERVAL HISTORY:   Rachel Huber 67 y.o. 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  She was last seen by Tarri Abernethy PA-C on 05/28/2022.   At today's visit, she reports feeling poorly.  Since her last visit, she was hospitalized from 09/17/2022 through 09/24/2022 for sepsis secondary to bacteremia (Staph capitis).  Her port was removed per infectious disease recommendations and she was treated with cefazolin x 14 days.  She had a PICC line placed while inpatient, but this was removed by home health after completion of her antibiotics.  She continues to receive regular paracentesis and frequent blood transfusions.  She had TIPS revision at [facility] on 08/20/2022.  She had embolization of hemorrhoids on Friday, 09/05/2022.   Patient received single dose of Retacrit 10,000 units on 09/17/2022.  Since this happened on the same day that she had to be admitted to the hospital for sepsis, patient is very concerned that Retacrit was the reason for her hospital admission in January/February 2024.  We have discussed that while fever can be a side effect of Retacrit injection in some patients, her fever was likely due to sepsis from bacteremia.  She remains hesitant to try any ESA injections in the future.    She reports that her rectal bleeding is slightly improved after her most recent TIPS revision, but continues to "come and go," and is particularly worse right about the time she is due for paracentesis.  She continues to follow closely with  gastroenterology (Dr. Haig Prophet) as well as hepatology/liver transplant at [facility].  No melena, epistaxis, hematemesis, or hematuria.  She has not had any severe rectal bleeding for the past week. Last blood transfusion was on 10/09/22.  She continues to have significant leg swelling, which tends to be worse after her blood transfusions, although it has slightly improved since her most recent TIPS revision.  Chronic headaches stable at baseline. No pica, restless legs, chest pain.  She continues to have orthopnea and intermittent dyspnea on exertion, particularly when she is due for paracentesis.  She has intermittent lightheadedness, no syncope.   She continues to take vitamin D 50,000 units weekly and daily vitamin B12 supplements.  She has little to no energy and 60% appetite. She endorses that she is maintaining a stable weight.   ASSESSMENT & PLAN:  1.  Severe multifactorial anemia - ETIOLOGY OF ANEMIA: Chronic GI blood loss + beta thalassemia intermedia + CKD.  She may also have some underlying degree of hemolysis from her beta thalassemia intermedia. - Colonoscopies in February 2022 & April 2022 showed grade III/IV nonbleeding internal hemorrhoids, polyps, and area of significantly congested mucosa - EGD in February 2022 & April 2022 showed portal hypertensive gastropathy and erythematous duodenopathy - She has had frequent hospitalizations and blood transfusions over the past year, with notable events recorded below:  Flexible sigmoidoscopy on 02/06/2022 showed prolapsed hemorrhoids and internal hemorrhoids.  Interventional radiology performed bilateral superior rectal artery particle and coil embolization on 02/07/2022. Most recent coil embolization of rectal arteries on 09/05/2022 - During hospitalizations, patient  has developed recurrent AKI's with evolving CKD - Trial of ESA in January 2024 for treatment of her CKD associated anemia: Single dose of Retacrit 10,000 units on 09/17/2022, on the  same day that she had to be admitted to the hospital for sepsis.  Patient concerned that Retacrit was the reason for her hospital admission in. We have discussed that while fever can be a side effect of Retacrit injection in some patients, her fever was likely due to sepsis from bacteremia.  She remains hesitant to try any ESA injections in the future.   - Most recent hematology panel (08/13/2022): Ferritin 440, iron saturation 59% Creatinine 1.70/GFR 33 Previous labs showed normal copper, folate, and SPEP. - CBC today (10/15/2022): Hgb 8.8/MCV 81.8. - Intermittent rectal bleeding, which is at times severe - Goal is to keep ferritin level around 200 (due to chronic GI blood loss and comorbid CKD); goal Hgb is 9.0-10.0 due to patient's underlying thalassemia.  Transfusion threshold is < 7.0. - PLAN: No IV iron at this time.   - Weekly CBC + BB sample with possible transfusion    - Goal is to keep ferritin level around 200 (due to chronic GI blood loss and comorbid CKD); goal Hgb is 9.0-10.0 due to patient's underlying thalassemia.  Transfusion threshold is < 7.0. - Full lab panel and office visit in 2 months - Will refer for IR port insertion ASAP, since last port was removed during hospitalization for bacteremia   ## AKI on CKD - Labs obtained at [facility] on 10/07/2022 show significant increase creatinine 3.3/BUN 48/GFR 15.  This is a marked increase from creatinine 1.41 on 09/24/2022 - Per [facility], patient was instructed to stop Lasix and spironolactone - Patient was scheduled for repeat labs at [facility] on 10/13/2022, but patient reports "mental fog" for the past several weeks and has zero recall of being told she needed repeat labs - PLAN: We will check BMP today and call [facility] with results.   Will also inquire if [facility] would like [place] to check periodic BMP along with patient's weekly CBC/BB sample.  2.  Thrombocytopenia & leukopenia  - Intermittent leukopenia since January 2022 (intermittently neutropenia and  lymphopenia) - Moderate thrombocytopenia since January 2022, with baseline platelets around 100-150 - CTA abdomen/pelvis (02/05/2022) shows splenomegaly at 17.3 cm - CBC today (10/15/2022): WBC 3.2/ALC 0.6, platelets 82. - PLAN: Differential diagnosis favors thrombocytopenia and leukopenia secondary to splenic sequestration and cirrhosis.  We will continue to monitor closely and consider additional workup such as bone marrow biopsy if any major deviations from baseline that are out of proportion to overall clinical picture  3.  Vitamin B12 deficiency  - Noted on 06/24/2021 to have normal vitamin B12 457, but with elevated methylmalonic acid 496 - Most recent labs (08/13/2022) with B12 723 and methylmalonic acid 563 - She is taking vitamin B12 supplement 500 mcg daily   - PLAN: Continue vitamin B12 500 mcg daily  - We will recheck B12/methylmalonic acid at follow-up visit in about 6 months (July 2024)   4.  Beta thalassemia intermedia  - She reports that she has always been anemic, but was not diagnosed with thalassemia until around age 67.  Her father, sister, and brother all have thalassemia. - Her hemoglobin has been as low as 4.6, and she has received  only 1 blood transfusion in the past related to her rectal bleeding.  She has not required regular transfusion for her thalassemia. 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  Discussed extensively with Dr. Delton Coombes, who felt that she may have some underlying degree of hemolysis from her beta thalassemia  intermedia, although low haptoglobin may also be from cirrhosis with high reticulocytes from acute blood loss anemia.   - Her highest hemoglobin is around 9-10, but her baseline hemoglobin usually runs from 7.0-8.0 - PLAN: We will transfuse as needed if < 7.0 or severely symptomatic from anemia.     5.  Vitamin D deficiency - Vitamin D deficiency noted at 19.97 (06/24/2021) - Taking vitamin D 50,000 units weekly since October 2023 - Most recent vitamin D (08/13/2022) improved at 49.25 - PLAN: DECREASED vitamin D 50,000 units to every other week dosing.  Will recheck vitamin D in 6 months (July 2024)  6.  History of uterine cancer - Diagnosed in 2010, she has total hysterectomy and bilateral oophorectomy - She did not require any chemotherapy or radiation - She was treated in [place], [place] - Her last CT imaging of her abdomen and pelvis showed no evidence of cancer recurrence   7.  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     PLAN SUMMARY: >> Referral to IR for port placement ASAP  >> Weekly CBC + BB sample + possible transfusion (day after labs) >> Full lab panel in 2 months = CBC/D, CMP, ferritin, iron/TIBC, vitamin D, B12, MMA, copper, folate >> OFFICE visit in 2 months (1 week after full lab panel)    REVIEW OF SYSTEMS:   Review of Systems  Constitutional:  Positive for fatigue. Negative for appetite change, chills, diaphoresis, fever and unexpected weight change.  HENT:   Negative for lump/mass and nosebleeds.   Eyes:  Negative for eye problems.  Respiratory:  Positive for cough and shortness of breath. Negative for  hemoptysis.   Cardiovascular:  Positive for leg swelling. Negative for chest pain and palpitations.  Gastrointestinal:  Positive for abdominal distention and blood in stool. Negative for abdominal pain, constipation, diarrhea, nausea and vomiting.  Genitourinary:  Negative for hematuria.   Musculoskeletal:  Positive for arthralgias.  Skin: Negative.   Neurological:  Positive for dizziness, headaches and light-headedness.  Hematological:  Does not bruise/bleed easily.     PHYSICAL EXAM:   ECOG PERFORMANCE STATUS: 3 - Symptomatic, >50% confined to bed  There were no vitals filed for this visit. There were no vitals filed for this visit. Physical Exam Constitutional:      Appearance: Normal appearance. She is obese.  HENT:     Head: Normocephalic and atraumatic.     Mouth/Throat:     Mouth: Mucous membranes are moist.  Eyes:     Extraocular Movements: Extraocular movements intact.     Pupils: Pupils are equal, round, and reactive to light.  Cardiovascular:     Rate and Rhythm: Normal rate and regular rhythm.  Pulses: Normal pulses.     Heart sounds: Normal heart sounds.  Pulmonary:     Effort: Pulmonary effort is normal.     Breath sounds: Rales (Faint bibasilar crackles) present.  Abdominal:     General: Bowel sounds are normal. There is distension.     Palpations: Abdomen is soft.     Tenderness: There is no abdominal tenderness.  Musculoskeletal:        General: No swelling.     Right lower leg: Edema (3+ pretibial edema, with pitting edema extending to hips) present.     Left lower leg: Edema (3+ pretibial edema, with pitting edema extending to hips) present.  Lymphadenopathy:     Cervical: No cervical adenopathy.  Skin:    General: Skin is warm and dry.     Comments: Significant skin thickening and hyperpigmentation over bilateral lower extremities in keeping with stasis dermatitis  Neurological:     General: No focal deficit present.     Mental Status: She is  alert and oriented to person, place, and time.  Psychiatric:        Mood and Affect: Mood normal.        Behavior: Behavior normal.     PAST MEDICAL/SURGICAL HISTORY:  Past Medical History:  Diagnosis Date   Anemia in chronic kidney disease (CKD) 02/07/2022   Anxiety    Arthritis    Cirrhosis of liver ([place])    Diabetes mellitus without complication (HCC)    Dyspnea    DOE   GERD (gastroesophageal reflux disease)    Grade IV internal hemorrhoids    Contingency plan for any future admissions for severe anemia in the setting of persistent GI bleed:  nuclear medicine tagged RBC scan to assess for location of GI bleeding - if from rectum, would then proceed to angiogram with possible repeat embolization.  Please notify IR in this case.   Hepatitis    PAST   Hypertension    Neuropathy    Neuropathy, diabetic ([place])    Pneumonia    Sinus complaint    Sleep apnea    CPAP   Thalassemia minor 1992   Past Surgical History:  Procedure Laterality Date   ABDOMINAL HYSTERECTOMY     BREAST BIOPSY Right 02/15/2018   [place] bx 6-6:30 ribbon shape, ONE CORE FRAGMENT WITH FIBROSIS. ONE CORE FRAGMENT WITH PORTION OF A DILATED   BREAST BIOPSY Right 02/15/2018   [place] bx 9:00 heart shape, USUAL DUCTAL HYPERPLASIA   BREAST LUMPECTOMY Right 03/09/2018   Procedure: BREAST LUMPECTOMY x 2;  Surgeon: Benjamine Sprague, DO;  Location: [facility] ORS;  Service: General;  Laterality: Right;   CATARACT EXTRACTION W/PHACO Left 06/11/2016   Procedure: CATARACT EXTRACTION PHACO AND INTRAOCULAR LENS PLACEMENT (IOC);  Surgeon: Estill Cotta, MD;  Location: [facility] ORS;  Service: Ophthalmology;  Laterality: Left;  Lot # [id number] H US:01:38.6 AP%:26.4 CDE:44.15   CATARACT EXTRACTION W/PHACO Right 06/15/2018   Procedure: CATARACT EXTRACTION PHACO AND INTRAOCULAR LENS PLACEMENT (IOC);  Surgeon: Birder Robson, MD;  Location: [facility] ORS;  Service: Ophthalmology;  Laterality: Right;  [place] 00:38.2 CDE 4.23 Fluid Pack Lot # [id number]    COLONOSCOPIES     COLONOSCOPY WITH PROPOFOL N/A 10/10/2020   Procedure: COLONOSCOPY WITH PROPOFOL;  Surgeon: Lesly Rubenstein, MD;  Location: [facility] ENDOSCOPY;  Service: Endoscopy;  Laterality: N/A;   COLONOSCOPY WITH PROPOFOL N/A 11/20/2020   Procedure: COLONOSCOPY WITH PROPOFOL;  Surgeon: Lesly Rubenstein, MD;  Location: [facility] ENDOSCOPY;  Service: Endoscopy;  Laterality: N/A;  DM STAT CBC, BMP COVID POSITIVE 09/02/2020   COLONOSCOPY WITH PROPOFOL N/A 06/19/2022   Procedure: COLONOSCOPY WITH PROPOFOL;  Surgeon: Lesly Rubenstein, MD;  Location: [facility] ENDOSCOPY;  Service: Endoscopy;  Laterality: N/A;   CTR     ESOPHAGOGASTRODUODENOSCOPY (EGD) WITH PROPOFOL N/A 10/10/2020   Procedure: ESOPHAGOGASTRODUODENOSCOPY (EGD) WITH PROPOFOL;  Surgeon: Lesly Rubenstein, MD;  Location: [facility] ENDOSCOPY;  Service: Endoscopy;  Laterality: N/A;  COVID POSITIVE 10/08/2020   FLEXIBLE SIGMOIDOSCOPY N/A 02/06/2022   Procedure: FLEXIBLE SIGMOIDOSCOPY;  Surgeon: Lesly Rubenstein, MD;  Location: [facility] ENDOSCOPY;  Service: Endoscopy;  Laterality: N/A;  Patient requests anesthesia   IR ANGIOGRAM SELECTIVE EACH ADDITIONAL VESSEL  02/07/2022   IR ANGIOGRAM SELECTIVE EACH ADDITIONAL VESSEL  02/07/2022   IR ANGIOGRAM SELECTIVE EACH ADDITIONAL VESSEL  09/05/2022   IR ANGIOGRAM VISCERAL SELECTIVE  02/07/2022   IR ANGIOGRAM VISCERAL SELECTIVE  09/05/2022   IR EMBO ART  VEN HEMORR LYMPH EXTRAV  INC GUIDE ROADMAPPING  09/05/2022   IR EMBO ARTERIAL NOT HEMORR HEMANG INC GUIDE ROADMAPPING  02/07/2022   IR IMAGING GUIDED PORT INSERTION  06/13/2022   IR PARACENTESIS  12/17/2021   IR PARACENTESIS  03/25/2022   IR RADIOLOGIST EVAL & MGMT  03/18/2022   IR RADIOLOGIST EVAL & MGMT  05/07/2022   IR RADIOLOGIST EVAL & MGMT  05/16/2022   IR RADIOLOGIST EVAL & MGMT  10/13/2022   IR US GUIDE VASC ACCESS RIGHT  02/07/2022   IR US GUIDE VASC ACCESS RIGHT  09/05/2022   JOINT REPLACEMENT     KNEE SURGERY Right 09/24/2017   plates and pins   PORT-A-CATH  REMOVAL N/A 09/23/2022   Procedure: MINOR REMOVAL PORT-A-CATH;  Surgeon: Aviva Signs, MD;  Location: [facility] OR[facility];  Service: General;  Laterality: N/A;   TEE WITHOUT CARDIOVERSION N/A 09/23/2022   Procedure: TRANSESOPHAGEAL ECHOCARDIOGRAM (TEE);  Surgeon: Arnoldo Lenis, MD;  Location: [facility];  Service: Endoscopy;  Laterality: N/A;   TOTAL SHOULDER ARTHROPLASTY Right 09/27/2015    SOCIAL HISTORY:  Social History   Socioeconomic History   Marital status: Married    Spouse name: Not on file   Number of children: 0   Years of education: Not on file   Highest education level: Not on file  Occupational History   Occupation: EMPLOYED  Tobacco Use   Smoking status: Never   Smokeless tobacco: Never  Vaping Use   Vaping Use: Never used  Substance and Sexual Activity   Alcohol use: No   Drug use: Never   Sexual activity: Not Currently  Other Topics Concern   Not on file  Social History Narrative   Not on file   Social Determinants of Health   Financial Resource Strain: Low Risk  (04/04/2022)   Overall Financial Resource Strain (CARDIA)    Difficulty of Paying Living Expenses: Not hard at all  Food Insecurity: No Food Insecurity (09/18/2022)   Hunger Vital Sign    Worried About Running Out of Food in the Last Year: Never true    [place] in the Last Year: Never true  Transportation Needs: No Transportation Needs (09/18/2022)   PRAPARE - Hydrologist (Medical): No    Lack of Transportation (Non-Medical): No  Physical Activity: Inactive (04/04/2022)   Exercise Vital Sign    Days of Exercise per Week: 0 days    Minutes of Exercise per Session: 0 min  Stress: No Stress Concern Present (04/04/2022)   [employer] - Occupational Stress Questionnaire    Feeling of Stress : Only a little  Social Connections: Moderately Isolated (04/04/2022)   Social Connection and Isolation Panel [NHANES]    Frequency of Communication with Friends  and Family: More than three times a week    Frequency of Social Gatherings with Friends and Family: Three times a week    Attends Religious Services: Never    Active Member of Clubs or Organizations: No    Attends Archivist Meetings: Never    Marital Status: Married  Human resources officer Violence: Not At Risk (09/18/2022)   Humiliation, Afraid, Rape, and Kick questionnaire    Fear of Current or Ex-Partner: No    Emotionally Abused: No    Physically Abused: No    Sexually Abused: No    FAMILY HISTORY:  Family History  Problem Relation Age of Onset   Breast cancer Mother 43   Lymphoma Mother    Diabetes Father    Kidney cancer Father    Heart disease Father    Diabetes Sister    Breast cancer Sister 9    CURRENT MEDICATIONS:  Outpatient Encounter Medications as of 10/15/2022  Medication Sig   acetaminophen (TYLENOL) 650 MG CR tablet Take 1,300 mg by mouth daily as needed for pain.   acidophilus (RISAQUAD) CAPS capsule Take 2 capsules by mouth 3 (three) times daily.   albuterol (VENTOLIN HFA) 108 (90 Base) MCG/ACT inhaler Inhale 2 puffs into the lungs every 6 (six) hours as needed for wheezing or shortness of breath.   atorvastatin (LIPITOR) 10 MG tablet Take 1 tablet (10 mg total) by mouth daily.   diphenhydrAMINE (BENADRYL) 25 MG tablet Take 25 mg by mouth 2 (two) times daily as needed for itching.   ergocalciferol (VITAMIN D2) 1.25 MG (50000 UT) capsule Take 1 capsule (50,000 Units total) by mouth every 14 (fourteen) days.   furosemide (LASIX) 40 MG tablet Take 40 mg by mouth daily.   gabapentin (NEURONTIN) 400 MG capsule Take 800 mg by mouth 2 (two) times daily.   hydrocortisone (ANUCORT-HC) 25 MG suppository Place 1 suppository (25 mg total) rectally 2 (two) times daily as needed for anal itching or hemorrhoids.   hydrocortisone (ANUSOL-HC) 2.5 % rectal cream Place rectally 4 (four) times daily.   lactulose, encephalopathy, (CHRONULAC) 10 GM/15ML SOLN Take 30 mLs (20 g  total) by mouth daily.   lidocaine-prilocaine (EMLA) cream Apply 1 Application topically See admin instructions for 1 dose. Apply to port site 1 hour prior to procedure   melatonin 5 MG TABS Take 10 mg by mouth at bedtime as needed (sleep).   midodrine (PROAMATINE) 10 MG tablet Take 1 tablet (10 mg total) by mouth 3 (three) times daily with meals.   midodrine (PROAMATINE) 10 MG tablet Take by mouth.   Multiple Vitamins-Minerals (MULTIVITAMIN WITH MINERALS) tablet Take 1 tablet by mouth daily.   polyethylene glycol (MIRALAX / GLYCOLAX) 17 g packet Take 17 g by mouth 2 (two) times daily.   sertraline (ZOLOFT) 50 MG tablet Take 50 mg by mouth daily.   vitamin B-12 (CYANOCOBALAMIN) 1000 MCG tablet Take 1,000 mcg by mouth daily.   witch hazel-glycerin (TUCKS) pad Apply topically as needed for hemorrhoids.   Facility-Administered Encounter Medications as of 10/15/2022  Medication   diphenhydrAMINE (BENADRYL) capsule 25 mg    ALLERGIES:  Allergies  Allergen Reactions   Gramineae Pollens Other (See Comments)    Sneezing, Running nose   Latex Rash    Contact rash  LABORATORY DATA:  I have reviewed the labs as listed.  CBC    Component Value Date/Time   WBC 3.1 (L) 10/09/2022 1121   RBC 2.78 (L) 10/09/2022 1121   HGB 6.9 (LL) 10/09/2022 1121   HCT 22.0 (L) 10/09/2022 1121   PLT 88 (L) 10/09/2022 1121   MCV 79.1 (L) 10/09/2022 1121   MCH 24.8 (L) 10/09/2022 1121   MCHC 31.4 10/09/2022 1121   RDW 22.5 (H) 10/09/2022 1121   LYMPHSABS 0.9 09/21/2022 0455   MONOABS 0.4 09/21/2022 0455   EOSABS 0.5 09/21/2022 0455   BASOSABS 0.0 09/21/2022 0455      Latest Ref Rng & Units 09/24/2022    5:00 AM 09/23/2022    4:23 AM 09/21/2022    4:55 AM  CMP  Glucose 70 - 99 mg/dL 136  166  123   BUN 8 - 23 mg/dL 31  32  35   Creatinine 0.44 - 1.00 mg/dL 1.41  1.45  1.52   Sodium 135 - 145 mmol/L 136  136  134   Potassium 3.5 - 5.1 mmol/L 3.3  3.8  3.7   Chloride 98 - 111 mmol/L 107  109  107   CO2  22 - 32 mmol/L '22  22  20   '$ Calcium 8.9 - 10.3 mg/dL 7.7  7.8  7.6   Total Protein 6.5 - 8.1 g/dL   4.4   Total Bilirubin 0.3 - 1.2 mg/dL   1.7   Alkaline Phos 38 - 126 U/L   89   AST 15 - 41 U/L   41   ALT 0 - 44 U/L   22     DIAGNOSTIC IMAGING:  I have independently reviewed the relevant imaging and discussed with the patient.   WRAP UP:  All questions were answered. The patient knows to call the clinic with any problems, questions or concerns.  Medical decision making: Moderate  Time spent on visit: I spent 25 minutes counseling the patient face to face. The total time spent in the appointment was 40 minutes and more than 50% was on counseling.  Harriett Rush, PA-C  10/15/2022 9:54 AM

## 2022-10-14 NOTE — Procedures (Signed)
PROCEDURE SUMMARY:  Successful US guided therapeutic paracentesis from RUQ.  Yielded 8 L of clear, yellow fluid.  No immediate complications.  Pt tolerated well.   Specimen not sent for labs.  EBL < 1 mL  Tyson Alias, AGNP 10/14/2022 12:55 PM

## 2022-10-15 ENCOUNTER — Inpatient Hospital Stay (HOSPITAL_BASED_OUTPATIENT_CLINIC_OR_DEPARTMENT_OTHER): Payer: 59 | Admitting: Physician Assistant

## 2022-10-15 ENCOUNTER — Inpatient Hospital Stay: Payer: 59

## 2022-10-15 ENCOUNTER — Other Ambulatory Visit: Payer: Self-pay | Admitting: Gastroenterology

## 2022-10-15 ENCOUNTER — Other Ambulatory Visit: Payer: Self-pay

## 2022-10-15 ENCOUNTER — Inpatient Hospital Stay (HOSPITAL_COMMUNITY)
Admission: EM | Admit: 2022-10-15 | Discharge: 2022-10-25 | DRG: 683 | Disposition: A | Discharge status: Home / Self Care | Payer: 59 | Attending: Internal Medicine | Admitting: Internal Medicine

## 2022-10-15 ENCOUNTER — Encounter (HOSPITAL_COMMUNITY): Payer: Self-pay

## 2022-10-15 ENCOUNTER — Ambulatory Visit: Payer: 59

## 2022-10-15 VITALS — BP 134/51 | HR 71 | Temp 97.3°F | Resp 16 | Ht 67.72 in | Wt 279.8 lb

## 2022-10-15 DIAGNOSIS — D500 Iron deficiency anemia secondary to blood loss (chronic): Secondary | ICD-10-CM | POA: Diagnosis not present

## 2022-10-15 DIAGNOSIS — K7460 Unspecified cirrhosis of liver: Secondary | ICD-10-CM | POA: Diagnosis not present

## 2022-10-15 DIAGNOSIS — D696 Thrombocytopenia, unspecified: Secondary | ICD-10-CM | POA: Diagnosis not present

## 2022-10-15 DIAGNOSIS — D61818 Other pancytopenia: Secondary | ICD-10-CM

## 2022-10-15 DIAGNOSIS — Z807 Family history of other malignant neoplasms of lymphoid, hematopoietic and related tissues: Secondary | ICD-10-CM

## 2022-10-15 DIAGNOSIS — Z452 Encounter for adjustment and management of vascular access device: Secondary | ICD-10-CM

## 2022-10-15 DIAGNOSIS — D561 Beta thalassemia: Secondary | ICD-10-CM

## 2022-10-15 DIAGNOSIS — I129 Hypertensive chronic kidney disease with stage 1 through stage 4 chronic kidney disease, or unspecified chronic kidney disease: Secondary | ICD-10-CM | POA: Diagnosis present

## 2022-10-15 DIAGNOSIS — Z833 Family history of diabetes mellitus: Secondary | ICD-10-CM

## 2022-10-15 DIAGNOSIS — E785 Hyperlipidemia, unspecified: Secondary | ICD-10-CM | POA: Diagnosis present

## 2022-10-15 DIAGNOSIS — Z961 Presence of intraocular lens: Secondary | ICD-10-CM | POA: Diagnosis present

## 2022-10-15 DIAGNOSIS — K7581 Nonalcoholic steatohepatitis (NASH): Secondary | ICD-10-CM | POA: Diagnosis present

## 2022-10-15 DIAGNOSIS — D631 Anemia in chronic kidney disease: Secondary | ICD-10-CM | POA: Diagnosis not present

## 2022-10-15 DIAGNOSIS — Z90710 Acquired absence of both cervix and uterus: Secondary | ICD-10-CM

## 2022-10-15 DIAGNOSIS — K643 Fourth degree hemorrhoids: Secondary | ICD-10-CM | POA: Diagnosis present

## 2022-10-15 DIAGNOSIS — D649 Anemia, unspecified: Secondary | ICD-10-CM

## 2022-10-15 DIAGNOSIS — N179 Acute kidney failure, unspecified: Secondary | ICD-10-CM

## 2022-10-15 DIAGNOSIS — M7989 Other specified soft tissue disorders: Secondary | ICD-10-CM | POA: Diagnosis present

## 2022-10-15 DIAGNOSIS — Z91040 Latex allergy status: Secondary | ICD-10-CM

## 2022-10-15 DIAGNOSIS — E869 Volume depletion, unspecified: Secondary | ICD-10-CM | POA: Diagnosis present

## 2022-10-15 DIAGNOSIS — E669 Obesity, unspecified: Secondary | ICD-10-CM | POA: Diagnosis present

## 2022-10-15 DIAGNOSIS — E876 Hypokalemia: Secondary | ICD-10-CM | POA: Diagnosis present

## 2022-10-15 DIAGNOSIS — E559 Vitamin D deficiency, unspecified: Secondary | ICD-10-CM

## 2022-10-15 DIAGNOSIS — Z8616 Personal history of COVID-19: Secondary | ICD-10-CM

## 2022-10-15 DIAGNOSIS — Z79899 Other long term (current) drug therapy: Secondary | ICD-10-CM

## 2022-10-15 DIAGNOSIS — E1122 Type 2 diabetes mellitus with diabetic chronic kidney disease: Secondary | ICD-10-CM | POA: Diagnosis present

## 2022-10-15 DIAGNOSIS — N1832 Chronic kidney disease, stage 3b: Secondary | ICD-10-CM | POA: Diagnosis present

## 2022-10-15 DIAGNOSIS — Z8542 Personal history of malignant neoplasm of other parts of uterus: Secondary | ICD-10-CM

## 2022-10-15 DIAGNOSIS — Z96611 Presence of right artificial shoulder joint: Secondary | ICD-10-CM | POA: Diagnosis present

## 2022-10-15 DIAGNOSIS — K7290 Hepatic failure, unspecified without coma: Secondary | ICD-10-CM | POA: Diagnosis present

## 2022-10-15 DIAGNOSIS — E1140 Type 2 diabetes mellitus with diabetic neuropathy, unspecified: Secondary | ICD-10-CM | POA: Diagnosis present

## 2022-10-15 DIAGNOSIS — R188 Other ascites: Secondary | ICD-10-CM | POA: Diagnosis present

## 2022-10-15 DIAGNOSIS — Z803 Family history of malignant neoplasm of breast: Secondary | ICD-10-CM

## 2022-10-15 DIAGNOSIS — D563 Thalassemia minor: Secondary | ICD-10-CM | POA: Diagnosis not present

## 2022-10-15 DIAGNOSIS — Z9841 Cataract extraction status, right eye: Secondary | ICD-10-CM

## 2022-10-15 DIAGNOSIS — Z8249 Family history of ischemic heart disease and other diseases of the circulatory system: Secondary | ICD-10-CM

## 2022-10-15 DIAGNOSIS — G4733 Obstructive sleep apnea (adult) (pediatric): Secondary | ICD-10-CM | POA: Diagnosis not present

## 2022-10-15 DIAGNOSIS — Z79890 Hormone replacement therapy (postmenopausal): Secondary | ICD-10-CM

## 2022-10-15 DIAGNOSIS — G4700 Insomnia, unspecified: Secondary | ICD-10-CM | POA: Diagnosis present

## 2022-10-15 DIAGNOSIS — E8770 Fluid overload, unspecified: Secondary | ICD-10-CM | POA: Diagnosis not present

## 2022-10-15 DIAGNOSIS — F419 Anxiety disorder, unspecified: Secondary | ICD-10-CM | POA: Diagnosis present

## 2022-10-15 DIAGNOSIS — Z6839 Body mass index (BMI) 39.0-39.9, adult: Secondary | ICD-10-CM

## 2022-10-15 DIAGNOSIS — F32A Depression, unspecified: Secondary | ICD-10-CM | POA: Diagnosis present

## 2022-10-15 DIAGNOSIS — Z8051 Family history of malignant neoplasm of kidney: Secondary | ICD-10-CM

## 2022-10-15 DIAGNOSIS — Z9842 Cataract extraction status, left eye: Secondary | ICD-10-CM

## 2022-10-15 DIAGNOSIS — E538 Deficiency of other specified B group vitamins: Secondary | ICD-10-CM

## 2022-10-15 DIAGNOSIS — I959 Hypotension, unspecified: Secondary | ICD-10-CM | POA: Diagnosis present

## 2022-10-15 DIAGNOSIS — N189 Chronic kidney disease, unspecified: Secondary | ICD-10-CM | POA: Diagnosis present

## 2022-10-15 LAB — CBC WITH DIFFERENTIAL/PLATELET
Abs Immature Granulocytes: 0.01 10*3/uL (ref 0.00–0.07)
Basophils Absolute: 0 10*3/uL (ref 0.0–0.1)
Basophils Relative: 1 %
Eosinophils Absolute: 0.3 10*3/uL (ref 0.0–0.5)
Eosinophils Relative: 9 %
HCT: 28.3 % — ABNORMAL LOW (ref 36.0–46.0)
Hemoglobin: 8.8 g/dL — ABNORMAL LOW (ref 12.0–15.0)
Immature Granulocytes: 0 %
Lymphocytes Relative: 19 %
Lymphs Abs: 0.6 10*3/uL — ABNORMAL LOW (ref 0.7–4.0)
MCH: 25.4 pg — ABNORMAL LOW (ref 26.0–34.0)
MCHC: 31.1 g/dL (ref 30.0–36.0)
MCV: 81.8 fL (ref 80.0–100.0)
Monocytes Absolute: 0.3 10*3/uL (ref 0.1–1.0)
Monocytes Relative: 10 %
Neutro Abs: 2 10*3/uL (ref 1.7–7.7)
Neutrophils Relative %: 61 %
Platelets: 82 10*3/uL — ABNORMAL LOW (ref 150–400)
RBC: 3.46 MIL/uL — ABNORMAL LOW (ref 3.87–5.11)
RDW: 22.6 % — ABNORMAL HIGH (ref 11.5–15.5)
WBC: 3.2 10*3/uL — ABNORMAL LOW (ref 4.0–10.5)
nRBC: 0 % (ref 0.0–0.2)

## 2022-10-15 LAB — BASIC METABOLIC PANEL
Anion gap: 8 (ref 5–15)
BUN: 69 mg/dL — ABNORMAL HIGH (ref 8–23)
CO2: 19 mmol/L — ABNORMAL LOW (ref 22–32)
Calcium: 8 mg/dL — ABNORMAL LOW (ref 8.9–10.3)
Chloride: 109 mmol/L (ref 98–111)
Creatinine, Ser: 4.31 mg/dL — ABNORMAL HIGH (ref 0.44–1.00)
GFR, Estimated: 11 mL/min — ABNORMAL LOW (ref >60)
Glucose, Bld: 114 mg/dL — ABNORMAL HIGH (ref 70–99)
Potassium: 4.3 mmol/L (ref 3.5–5.1)
Sodium: 136 mmol/L (ref 135–145)

## 2022-10-15 LAB — MAGNESIUM: Magnesium: 2.1 mg/dL (ref 1.7–2.4)

## 2022-10-15 LAB — PHOSPHORUS: Phosphorus: 6.8 mg/dL — ABNORMAL HIGH (ref 2.5–4.6)

## 2022-10-15 LAB — SAMPLE TO BLOOD BANK

## 2022-10-15 MED ORDER — ACETAMINOPHEN 325 MG PO TABS
650.0000 mg | ORAL_TABLET | Freq: Four times a day (QID) | ORAL | Status: DC | PRN
  Administered 2022-10-16 – 2022-10-24 (×5): 650 mg via ORAL
  Filled 2022-10-15 (×7): qty 2

## 2022-10-15 MED ORDER — HEPARIN SOD (PORK) LOCK FLUSH 100 UNIT/ML IV SOLN: 250.0000 [IU] | Freq: Once | INTRAVENOUS | Status: DC

## 2022-10-15 MED ORDER — SODIUM CHLORIDE 0.9 % IV SOLN: INTRAVENOUS | Status: DC

## 2022-10-15 MED ORDER — ONDANSETRON HCL 4 MG PO TABS: 4.0000 mg | ORAL_TABLET | Freq: Four times a day (QID) | ORAL | Status: DC | PRN

## 2022-10-15 MED ORDER — FENTANYL CITRATE PF 50 MCG/ML IJ SOSY: 12.5000 ug | PREFILLED_SYRINGE | INTRAMUSCULAR | Status: DC | PRN

## 2022-10-15 MED ORDER — ACETAMINOPHEN 650 MG RE SUPP: 650.0000 mg | Freq: Four times a day (QID) | RECTAL | Status: DC | PRN

## 2022-10-15 MED ORDER — BISACODYL 5 MG PO TBEC: 5.0000 mg | DELAYED_RELEASE_TABLET | Freq: Every day | ORAL | Status: DC | PRN

## 2022-10-15 MED ORDER — ONDANSETRON HCL 4 MG/2ML IJ SOLN: 4.0000 mg | Freq: Four times a day (QID) | INTRAMUSCULAR | Status: DC | PRN

## 2022-10-15 MED ORDER — OXYCODONE HCL 5 MG PO TABS
5.0000 mg | ORAL_TABLET | Freq: Four times a day (QID) | ORAL | Status: DC | PRN
  Filled 2022-10-15 (×3): qty 1

## 2022-10-15 MED ORDER — SODIUM CHLORIDE 0.9% FLUSH: 10.0000 mL | Freq: Once | INTRAVENOUS | Status: DC

## 2022-10-15 MED ORDER — SODIUM CHLORIDE 0.9 % IV BOLUS
500.0000 mL | Freq: Once | INTRAVENOUS | Status: AC
  Administered 2022-10-15: 500 mL via INTRAVENOUS

## 2022-10-15 NOTE — ED Triage Notes (Signed)
Pt presents to ED from home C/O abnormal lab. Reports creatinine 4.4.

## 2022-10-15 NOTE — Progress Notes (Signed)
Patient declined hospital CPAP. States she normally doesn't wear one when she is in the hospital. Told her unit would be provided if she changed her mind.

## 2022-10-15 NOTE — H&P (Signed)
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  She had a PICC line placed and this has subsequently been removed since completion of IV antibiotics.  Patient is status post TIPS at [facility] on 08/20/2022.  And she is status post embolization of hemorrhoids 09/05/2022.  Patient is followed closely by gastroenterology and hepatology liver transplant at [facility]  She reportedly had last transfusion 10/09/2022.  She has chronic swelling in the legs and had been on diuretics but she was recently taken off all diuretics around 09/24/2022.  Around that time she was noted to have a small bump in creatinine.  She has had subsequent blood work done that shows that her creatinine is continue to climb and now up to greater than 4.  She does not seem to have symptoms for this now.  Her hematology clinic informed her about the abnormal labs and requested that she come into the hospital for further management.    Past Medical History:  Diagnosis Date   Anemia in chronic kidney disease (CKD) 02/07/2022   Anxiety    Arthritis    Cirrhosis of liver ([place])    Diabetes mellitus without complication (HCC)    Dyspnea     DOE   GERD (gastroesophageal reflux disease)    Grade IV internal hemorrhoids    Contingency plan for any future admissions for severe anemia in the setting of persistent GI bleed:  nuclear medicine tagged RBC scan to assess for location of GI bleeding - if from rectum, would then proceed to angiogram with possible repeat embolization.  Please notify IR in this case.   Hepatitis    PAST   Hypertension    Neuropathy    Neuropathy, diabetic ([place])    Pneumonia    Sinus complaint    Sleep apnea    CPAP   Thalassemia minor 1992    Past Surgical History:  Procedure Laterality Date   ABDOMINAL HYSTERECTOMY     BREAST BIOPSY Right 02/15/2018   [place] bx 6-6:30 ribbon shape, ONE CORE FRAGMENT WITH FIBROSIS. ONE CORE FRAGMENT WITH PORTION OF A DILATED   BREAST BIOPSY Right 02/15/2018   [place] bx 9:00 heart shape, USUAL DUCTAL HYPERPLASIA   BREAST LUMPECTOMY Right 03/09/2018   Procedure: BREAST LUMPECTOMY x 2;  Surgeon: Benjamine Sprague, DO;  Location: [facility] ORS;  Service: General;  Laterality: Right;   CATARACT EXTRACTION W/PHACO Left 06/11/2016   Procedure: CATARACT EXTRACTION PHACO AND INTRAOCULAR LENS PLACEMENT (IOC);  Surgeon: Estill Cotta, MD;  Location: [facility] ORS;  Service: Ophthalmology;  Laterality: Left;  Lot # [id number] H US:01:38.6 AP%:26.4 CDE:44.15   CATARACT EXTRACTION W/PHACO Right 06/15/2018   Procedure: CATARACT EXTRACTION PHACO AND INTRAOCULAR LENS PLACEMENT (IOC);  Surgeon: Birder Robson, MD;  Location: [facility] ORS;  Service: Ophthalmology;  Laterality: Right;  [place]  00:38.2 CDE 4.23 Fluid Pack Lot # [id number]   COLONOSCOPIES     COLONOSCOPY WITH PROPOFOL N/A 10/10/2020   Procedure: COLONOSCOPY WITH PROPOFOL;  Surgeon: Lesly Rubenstein, MD;  Location: [facility] ENDOSCOPY;  Service: Endoscopy;  Laterality: N/A;   COLONOSCOPY WITH PROPOFOL N/A 11/20/2020   Procedure: COLONOSCOPY WITH PROPOFOL;  Surgeon: Lesly Rubenstein, MD;  Location: [facility] ENDOSCOPY;  Service: Endoscopy;  Laterality: N/A;   DM STAT CBC, BMP COVID POSITIVE 09/02/2020   COLONOSCOPY WITH PROPOFOL N/A 06/19/2022   Procedure: COLONOSCOPY WITH PROPOFOL;  Surgeon: Lesly Rubenstein, MD;  Location: [facility] ENDOSCOPY;  Service: Endoscopy;  Laterality: N/A;   CTR     ESOPHAGOGASTRODUODENOSCOPY (EGD) WITH PROPOFOL N/A 10/10/2020   Procedure: ESOPHAGOGASTRODUODENOSCOPY (EGD) WITH PROPOFOL;  Surgeon: Lesly Rubenstein, MD;  Location: [facility] ENDOSCOPY;  Service: Endoscopy;  Laterality: N/A;  COVID POSITIVE 10/08/2020   FLEXIBLE SIGMOIDOSCOPY N/A 02/06/2022   Procedure: FLEXIBLE SIGMOIDOSCOPY;  Surgeon: Lesly Rubenstein, MD;  Location: [facility] ENDOSCOPY;  Service: Endoscopy;  Laterality: N/A;  Patient requests anesthesia   IR ANGIOGRAM SELECTIVE EACH ADDITIONAL VESSEL  02/07/2022   IR ANGIOGRAM SELECTIVE EACH ADDITIONAL VESSEL  02/07/2022   IR ANGIOGRAM SELECTIVE EACH ADDITIONAL VESSEL  09/05/2022   IR ANGIOGRAM VISCERAL SELECTIVE  02/07/2022   IR ANGIOGRAM VISCERAL SELECTIVE  09/05/2022   IR EMBO ART  VEN HEMORR LYMPH EXTRAV  INC GUIDE ROADMAPPING  09/05/2022   IR EMBO ARTERIAL NOT HEMORR HEMANG INC GUIDE ROADMAPPING  02/07/2022   IR IMAGING GUIDED PORT INSERTION  06/13/2022   IR PARACENTESIS  12/17/2021   IR PARACENTESIS  03/25/2022   IR RADIOLOGIST EVAL & MGMT  03/18/2022   IR RADIOLOGIST EVAL & MGMT  05/07/2022   IR RADIOLOGIST EVAL & MGMT  05/16/2022   IR RADIOLOGIST EVAL & MGMT  10/13/2022   IR US GUIDE VASC ACCESS RIGHT  02/07/2022   IR US GUIDE VASC ACCESS RIGHT  09/05/2022   JOINT REPLACEMENT     KNEE SURGERY Right 09/24/2017   plates and pins   PORT-A-CATH REMOVAL N/A 09/23/2022   Procedure: MINOR REMOVAL PORT-A-CATH;  Surgeon: Aviva Signs, MD;  Location: [facility];  Service: General;  Laterality: N/A;   TEE WITHOUT CARDIOVERSION N/A 09/23/2022   Procedure: TRANSESOPHAGEAL ECHOCARDIOGRAM (TEE);  Surgeon: Arnoldo Lenis, MD;  Location: [facility] OR[facility];  Service: Endoscopy;  Laterality: N/A;   TOTAL SHOULDER ARTHROPLASTY Right 09/27/2015      reports that she has never smoked. She has never used smokeless tobacco. She reports that she does not drink alcohol and does not use drugs.  Allergies  Allergen Reactions   Gramineae Pollens Other (See Comments)    Sneezing, Running nose   Latex Rash    Contact rash    Family History  Problem Relation Age of Onset   Breast cancer Mother 50   Lymphoma Mother    Diabetes Father    Kidney cancer Father    Heart disease Father    Diabetes Sister    Breast cancer Sister 71    Prior to Admission medications   Medication Sig Start Date End Date Taking? Authorizing Provider  acetaminophen (TYLENOL) 650 MG CR tablet Take 1,300 mg by mouth daily as needed for pain.    [provider]  acidophilus (RISAQUAD) CAPS capsule Take 2 capsules by mouth 3 (three) times daily. 09/24/22 10/24/22  Manuella Ghazi, Pratik D, DO  albuterol (VENTOLIN HFA) 108 (90 Base) MCG/ACT inhaler Inhale 2 puffs into the lungs every 6 (six) hours  as needed for wheezing or shortness of breath. 02/10/22   Loletha Grayer, MD  atorvastatin (LIPITOR) 10 MG tablet Take 1 tablet (10 mg total) by mouth daily. 06/20/22   Fritzi Mandes, MD  diphenhydrAMINE (BENADRYL) 25 MG tablet Take 25 mg by mouth 2 (two) times daily as needed for itching.    [provider]  ergocalciferol (VITAMIN D2) 1.25 MG (50000 UT) capsule Take 1 capsule (50,000 Units total) by mouth every 14 (fourteen) days. 09/01/22   Harriett Rush, PA-C  gabapentin (NEURONTIN) 400 MG capsule Take 800 mg by mouth 2 (two) times daily. 01/07/18   [provider]  hydrocortisone (ANUCORT-HC) 25 MG suppository Place 1 suppository (25 mg total) rectally 2 (two) times daily as needed for anal itching or hemorrhoids. 06/20/22   Fritzi Mandes, MD  hydrocortisone (ANUSOL-HC) 2.5 % rectal cream Place rectally 4 (four) times daily. 03/07/22   Loletha Grayer, MD  lactulose, encephalopathy, (CHRONULAC) 10 GM/15ML SOLN Take 30 mLs (20 g total) by mouth daily.  01/30/22   Roxan Hockey, MD  lidocaine-prilocaine (EMLA) cream Apply 1 Application topically See admin instructions for 1 dose. Apply to port site 1 hour prior to procedure 06/23/22   Narda Rutherford T, NP  melatonin 5 MG TABS Take 10 mg by mouth at bedtime as needed (sleep).    [provider]  midodrine (PROAMATINE) 10 MG tablet Take 1 tablet (10 mg total) by mouth 3 (three) times daily with meals. 09/24/22 10/24/22  Manuella Ghazi, Pratik D, DO  midodrine (PROAMATINE) 10 MG tablet Take by mouth. 09/24/22 10/24/22  [provider]  Multiple Vitamins-Minerals (MULTIVITAMIN WITH MINERALS) tablet Take 1 tablet by mouth daily.    [provider]  polyethylene glycol (MIRALAX / GLYCOLAX) 17 g packet Take 17 g by mouth 2 (two) times daily. 06/20/22   Fritzi Mandes, MD  sertraline (ZOLOFT) 50 MG tablet Take 50 mg by mouth daily. 12/12/17   [provider]  vitamin B-12 (CYANOCOBALAMIN) 1000 MCG tablet Take 1,000 mcg by mouth daily.    [provider]  witch hazel-glycerin (TUCKS) pad Apply topically as needed for hemorrhoids. 03/07/22   Loletha Grayer, MD    Physical Exam: Vitals:   10/15/22 1117 10/15/22 1119  BP:  (!) 113/49  Pulse:  71  Resp:  19  Temp:  (!) 97.5 F (36.4 C)  TempSrc:  Oral  SpO2:  100%  Weight: 127 kg   Height: '5\' 7"'$  (1.702 m)     Constitutional: awake, alert, cooperative, NAD, calm, comfortable Eyes: PERRL, lids and conjunctivae normal ENMT: Mucous membranes are moist. Posterior pharynx clear of any exudate or lesions.Normal dentition.  Neck: normal, supple, no masses, no thyromegaly Respiratory: clear to auscultation bilaterally, no wheezing, no crackles. Normal respiratory effort. No accessory muscle use.  Cardiovascular: normal s1, s2 sounds, no murmurs / rubs / gallops. No extremity edema. 2+ pedal pulses. No carotid bruits.  Abdomen: no tenderness, no masses palpated. No hepatosplenomegaly. Bowel sounds positive.  Musculoskeletal: 1-2+  edema BLEs, no clubbing / cyanosis. No joint deformity upper and lower extremities. Good ROM, no contractures. Normal muscle tone.  Skin: no rashes, lesions, ulcers. No induration Neurologic: CN 2-12 grossly intact. Sensation intact, DTR normal. Strength 5/5 in all 4.  Psychiatric: Normal judgment and insight. Alert and oriented x 3. Normal mood.   Labs on Admission: I have personally reviewed following labs and imaging studies  CBC: Recent Labs  Lab 10/09/22 1121 10/15/22 0759  WBC 3.1* 3.2*  NEUTROABS  --  2.0  HGB 6.9* 8.8*  HCT 22.0* 28.3*  MCV 79.1* 81.8  PLT 88* 82*   Basic Metabolic Panel: Recent Labs  Lab 10/15/22 0759 10/15/22 0936  NA  --  136  K  --  4.3  CL  --  109  CO2  --  19*  GLUCOSE  --  114*  BUN  --  69*  CREATININE  --  4.31*  CALCIUM  --  8.0*  MG 2.1  --   PHOS 6.8*  --    GFR: Estimated Creatinine Clearance: 18.3 mL/min (A) (by C-G formula based on SCr of 4.31 mg/dL (H)). Liver Function Tests: No results for input(s): "AST", "ALT", "ALKPHOS", "BILITOT", "PROT", "ALBUMIN" in the last 168 hours. No results for input(s): "LIPASE", "AMYLASE" in the last 168 hours. No results for input(s): "AMMONIA" in the last 168 hours. Coagulation Profile: No results for input(s): "INR", "PROTIME" in the last 168 hours. Cardiac Enzymes: No results for input(s): "CKTOTAL", "CKMB", "CKMBINDEX", "TROPONINI" in the last 168 hours. BNP (last 3 results) No results for input(s): "PROBNP" in the last 8760 hours. HbA1C: No results for input(s): "HGBA1C" in the last 72 hours. CBG: No results for input(s): "GLUCAP" in the last 168 hours. Lipid Profile: No results for input(s): "CHOL", "HDL", "LDLCALC", "TRIG", "CHOLHDL", "LDLDIRECT" in the last 72 hours. Thyroid Function Tests: No results for input(s): "TSH", "T4TOTAL", "FREET4", "T3FREE", "THYROIDAB" in the last 72 hours. Anemia Panel: No results for input(s): "VITAMINB12", "FOLATE", "FERRITIN", "TIBC", "IRON",  "RETICCTPCT" in the last 72 hours. Urine analysis:    Component Value Date/Time   COLORURINE AMBER (A) 09/17/2022 2120   APPEARANCEUR CLEAR 09/17/2022 2120   LABSPEC 1.018 09/17/2022 2120   PHURINE 5.0 09/17/2022 2120   GLUCOSEU NEGATIVE 09/17/2022 2120   HGBUR NEGATIVE 09/17/2022 2120   [place] NEGATIVE 09/17/2022 2120   [place] NEGATIVE 09/17/2022 2120   PROTEINUR NEGATIVE 09/17/2022 2120   NITRITE NEGATIVE 09/17/2022 2120   LEUKOCYTESUR NEGATIVE 09/17/2022 2120    Radiological Exams on Admission: US Paracentesis  Result Date: 10/14/2022 INDICATION: Refractory ascites in patient with history of NASH cirrhosis status post TIPS procedure May 2023 and [facility] revision [facility]. She recently underwent second TIPS revision 08/20/2022. Request received for therapeutic paracentesis with 8 L max. EXAM: ULTRASOUND GUIDED THERAPEUTIC RIGHT UPPER QUADRANT PARACENTESIS MEDICATIONS: 10 mL 1 % lidocaine COMPLICATIONS: None immediate. PROCEDURE: Informed written consent was obtained from the patient after a discussion of the risks, benefits and alternatives to treatment. A timeout was performed prior to the initiation of the procedure. Initial ultrasound scanning demonstrates a moderate amount of ascites within the right upper abdominal quadrant. The right upper abdomen was prepped and draped in the usual sterile fashion. 1% lidocaine was used for local anesthesia. Following this, a 19 gauge, 10-cm, Yueh catheter was introduced. An ultrasound image was saved for documentation purposes. The paracentesis was performed. The catheter was removed and a dressing was applied. The patient tolerated the procedure well without immediate post procedural complication. Patient received post-procedure intravenous albumin; see nursing notes for details. FINDINGS: A total of approximately 8 L of clear, yellow fluid was removed. IMPRESSION: Successful ultrasound-guided paracentesis yielding 8 liters of peritoneal  fluid. Read by: Narda Rutherford, AGNP-BC Electronically Signed   By: Ruthann Cancer M.D.   On: 10/14/2022 13:49    EKG: Independently reviewed.   Assessment/Plan Active Problems:   Thalassemia minor   HLD (hyperlipidemia)   Acute kidney injury superimposed on CKD (HCC)   Decompensated  hepatic cirrhosis (HCC)   OSA on CPAP   History of uterine cancer   Insomnia   Chronic kidney disease, stage 3b (HCC)   Liver cirrhosis secondary to NASH (HCC)   Anemia in chronic kidney disease (CKD)   Thrombocytopenia (HCC)   Grade IV internal hemorrhoids   AKI (acute kidney injury) ([place])   AKI on CKD stage IIIb -Continue to hold all diuretics at this time -Patient appears to be intravascularly volume depleted -Gently hydrate with IV fluid -Renally dose all medications as appropriate, avoiding nephrotoxic agents -ED consulted nephrology who will see patient in a.m.  Chronic anemia -Secondary to thalassemia disease, managed by hematologist -Secondary to chronic kidney disease  Thrombocytopenia -Secondary chronic liver disease -No active bleeding complications at this time -Follow CBC daily  OSA on CPAP -Will offer nightly CPAP while in hospital  Liver cirrhosis secondary to NASH -Patient is followed closely by [facility] hepatology -Patient planning to have teeth removed as a final requirement for liver transplant consideration -Status post recent paracentesis 10/14/2022 where 8 L of fluid removed  DVT prophylaxis: TED hoses   Code Status: Full   Family Communication: patient at bedside   Disposition Plan: anticipate return home   Consults called: Nephrology   Admission status: OBV   Level of care: Telemetry Irwin Brakeman MD [facility] How to contact the [facility] Attending or Consulting provider [place] or covering provider during after hours [place], for this patient?  Check the care team in [facility] and look for a) attending/consulting [facility] provider listed and b) the [facility] team listed Log into  [email or web address] and use [place]'s universal password to access. If you do not have the password, please contact the hospital operator. Locate the [facility] provider you are looking for under [facility] and page to a number that you can be directly reached. If you still have difficulty reaching the provider, please page the [facility] (Director on Call) for the Hospitalists listed on amion for assistance.   If 7PM-7AM, please contact night-coverage [email or web address] Password [facility]  10/15/2022, 2:27 PM

## 2022-10-15 NOTE — Progress Notes (Signed)
BMP results received and reviewed with Tarri Abernethy, PA-C. Patient has been advised to go to the Emergency Department for further evaluation. On call RN, Sarah with [facility] made aware and agreeable to ED evaluation. I have called and spoken with the patient, who agrees to report back to [facility] Emergency Department. Report call to Otila Kluver, ED RN.

## 2022-10-15 NOTE — ED Provider Notes (Signed)
Emergency Department Provider Note   I have reviewed the triage vital signs and the nursing notes.   HISTORY  Chief Complaint Abnormal Lab   HPI Rachel Huber is a 67 y.o. female past history of CKD, diabetes, liver cirrhosis, GERD presents emergency department with worsening kidney function.  She was referred here by her oncology team who followed her for anemia.  She had lab work on 2/20 in the [place] system which showed creatinine now elevated above 3 with a baseline of 1.5.  They discontinued her Lasix and held her spironolactone.  Patient states they also limited the amount of fluid that could be removed during her regularly scheduled large-volume paracentesis.  On follow-up labs today, creatinine appears to be worsening.  No fevers.  Denies abdominal or back pain.    Past Medical History:  Diagnosis Date   Anemia in chronic kidney disease (CKD) 02/07/2022   Anxiety    Arthritis    Cirrhosis of liver ([place])    Diabetes mellitus without complication (HCC)    Dyspnea    DOE   GERD (gastroesophageal reflux disease)    Grade IV internal hemorrhoids    Contingency plan for any future admissions for severe anemia in the setting of persistent GI bleed:  nuclear medicine tagged RBC scan to assess for location of GI bleeding - if from rectum, would then proceed to angiogram with possible repeat embolization.  Please notify IR in this case.   Hepatitis    PAST   Hypertension    Neuropathy    Neuropathy, diabetic ([place])    Pneumonia    Sinus complaint    Sleep apnea    CPAP   Thalassemia minor 1992    Review of Systems  Constitutional: No fever/chills Eyes: No visual changes. ENT: No sore throat. Cardiovascular: Denies chest pain. Respiratory: Denies shortness of breath. Gastrointestinal: negative abdominal pain.  No nausea, no vomiting.  No diarrhea.  No constipation. Musculoskeletal: Negative for back pain. Skin: Negative for rash. Neurological: Negative for headaches,  focal weakness or numbness.   ____________________________________________   PHYSICAL EXAM:  VITAL SIGNS: ED Triage Vitals  Enc Vitals Group     BP 10/15/22 1119 (!) 113/49     Pulse Rate 10/15/22 1119 71     Resp 10/15/22 1119 19     Temp 10/15/22 1119 (!) 97.5 F (36.4 C)     Temp Source 10/15/22 1119 Oral     SpO2 10/15/22 1119 100 %     Weight 10/15/22 1117 280 lb (127 kg)     Height 10/15/22 1117 '5\' 7"'$  (1.702 m)   Constitutional: Alert and oriented. Well appearing and in no acute distress. Eyes: Conjunctivae are normal.  Head: Atraumatic. Nose: No congestion/rhinnorhea. Mouth/Throat: Mucous membranes are moist.   Neck: No stridor.   Cardiovascular: Normal rate, regular rhythm. Good peripheral circulation. Grossly normal heart sounds.   Respiratory: Normal respiratory effort.  No retractions. Lungs CTAB. Gastrointestinal: Soft and nontender. Positive distention.  Musculoskeletal: No lower extremity tenderness with pitting edema in the bilateral LEs (1+). No gross deformities of extremities. Neurologic:  Normal speech and language. No gross focal neurologic deficits are appreciated.  Skin:  Skin is warm, dry and intact. No rash noted. ____________________________________________   LABS (all labs ordered are listed, but only abnormal results are displayed)  Labs Reviewed  PHOSPHORUS - Abnormal; Notable for the following components:      Result Value   Phosphorus 6.8 (*)    All other  components within normal limits  RENAL FUNCTION PANEL - Abnormal; Notable for the following components:   CO2 19 (*)    Glucose, Bld 109 (*)    BUN 67 (*)    Creatinine, Ser 4.09 (*)    Calcium 7.8 (*)    Phosphorus 6.6 (*)    Albumin 2.3 (*)    GFR, Estimated 12 (*)    All other components within normal limits  CBC - Abnormal; Notable for the following components:   WBC 2.6 (*)    RBC 2.90 (*)    Hemoglobin 7.3 (*)    HCT 23.4 (*)    MCH 25.2 (*)    RDW 22.6 (*)    Platelets  68 (*)    All other components within normal limits  URINALYSIS, COMPLETE (UACMP) WITH MICROSCOPIC - Abnormal; Notable for the following components:   APPearance HAZY (*)    Hgb urine dipstick MODERATE (*)    Protein, ur 30 (*)    Leukocytes,Ua MODERATE (*)    Bacteria, UA RARE (*)    All other components within normal limits  MAGNESIUM  MAGNESIUM  RENAL FUNCTION PANEL  MAGNESIUM  CBC  AMMONIA  HEPATIC FUNCTION PANEL   ____________________________________________   PROCEDURES  Procedure(s) performed:   Procedures  None  ____________________________________________   INITIAL IMPRESSION / ASSESSMENT AND PLAN / ED COURSE  Pertinent labs & imaging results that were available during my care of the patient were reviewed by me and considered in my medical decision making (see chart for details).   This patient is Presenting for Evaluation of AKI, which does require a range of treatment options, and is a complaint that involves a high risk of morbidity and mortality.  The Differential Diagnoses include pre-renal, intra-renal AKI, post-renal, etc.  Critical Interventions-    Medications  acetaminophen (TYLENOL) tablet 650 mg (650 mg Oral Given 10/16/22 0835)    Or  acetaminophen (TYLENOL) suppository 650 mg ( Rectal See Alternative 10/16/22 0835)  oxyCODONE (Oxy IR/ROXICODONE) immediate release tablet 5 mg (has no administration in time range)  fentaNYL (SUBLIMAZE) injection 12.5 mcg (has no administration in time range)  ondansetron (ZOFRAN) tablet 4 mg (has no administration in time range)    Or  ondansetron (ZOFRAN) injection 4 mg (has no administration in time range)  bisacodyl (DULCOLAX) EC tablet 5 mg (has no administration in time range)  midodrine (PROAMATINE) tablet 10 mg (10 mg Oral Given 10/16/22 1627)  albumin human 25 % solution 25 g (25 g Intravenous New Bag/Given 10/16/22 1626)  atorvastatin (LIPITOR) tablet 10 mg (10 mg Oral Given 10/16/22 1625)  ciprofloxacin  (CIPRO) tablet 500 mg (500 mg Oral Given 10/16/22 1348)  diphenhydrAMINE (BENADRYL) capsule 25 mg (has no administration in time range)  melatonin tablet 9 mg (has no administration in time range)  sertraline (ZOLOFT) tablet 50 mg (50 mg Oral Given 10/16/22 1348)  polyethylene glycol (MIRALAX / GLYCOLAX) packet 17 g (17 g Oral Given 10/16/22 1348)  albuterol (PROVENTIL) (2.5 MG/3ML) 0.083% nebulizer solution 2.5 mg (has no administration in time range)  sodium chloride 0.9 % bolus 500 mL (0 mLs Intravenous Stopped 10/15/22 1545)    Reassessment after intervention: No worsening symptoms.    I did obtain Additional Historical Information from husband.  I decided to review pertinent External Data, and in summary patient with worsening creatinine in labs from 2/20 in the [facility] system.   Clinical Laboratory Tests Ordered, included creatinine now greater than 4.  Magnesium normal.  Mild  anemia likely of chronic disease.  Cardiac Monitor Tracing which shows NSR.    Social Determinants of Health Risk no smoking history.   Medical Decision Making: Summary:  Patient presents emergency department for evaluation of worsening kidney function on labs.  Overall patient is well-appearing.  Doubt postrenal cause of AKI.  May be slight volume depletion on exam and gave a gentle IV fluid bolus but AKI likely is multifactorial.  Concern for hepatorenal syndrome as well.  Plan for admit.   Hospitalist consulted who will admit.   Reevaluation with update and discussion with patient and husband. They are in agreement.   Patient's presentation is most consistent with acute presentation with potential threat to life or bodily function.   Disposition: admit  ____________________________________________  FINAL CLINICAL IMPRESSION(S) / ED DIAGNOSES  Final diagnoses:  AKI (acute kidney injury) ([place])    Note:  This document was prepared using Dragon voice recognition software and may include unintentional  dictation errors.  Nanda Quinton, MD, [facility] Emergency Medicine    Kekai Geter, [facility], MD 10/16/22 [phone]

## 2022-10-15 NOTE — Patient Instructions (Signed)
[facility] [place] **   You were seen today by Tarri Abernethy PA-C for your iron deficiency anemia.    IRON DEFICIENCY ANEMIA & THALASSEMIA: Your anemia is related to your thalassemia, chronic blood loss, as well as your chronic kidney disease. We will continue to check your blood once a week and give blood transfusion if you have hemoglobin <7.0  VITAMIN B-12 DEFICIENCY Continue taking vitamin B12 500 mcg daily  VITAMIN D DEFICIENCY DECREASE dose of vitamin D to 50,000 units EVERY OTHER WEEK.  ** WORSENING KIDNEY FUNCTION: Your labs from [place] last week showed severe worsening of your kidney function.  We will recheck your kidney function today and we will call [facility] to inform them of these results.  You will hear from either [place] or from [place] later today with further instructions.  FOLLOW-UP APPOINTMENT: Office visit in 2 months  ** Thank you for trusting me with your healthcare!  I strive to provide all of my patients with quality care at each visit.  If you receive a survey for this visit, I would be so grateful to you for taking the time to provide feedback.  Thank you in advance!  ~ Mounir Skipper                   Dr. Derek Jack   &   Tarri Abernethy, PA-C   - - - - - - - - - - - - - - - - - -    Thank you for choosing [facility] to provide your oncology and hematology care.  To afford each patient quality time with our provider, please arrive at least 15 minutes before your scheduled appointment time.   If you have a lab appointment with the [facility] please come in thru the Main Entrance and check in at the main information desk.  You need to re-schedule your appointment should you arrive 10 or more minutes late.  We strive to give you quality time with our providers, and arriving late affects you and other patients whose appointments are after yours.  Also, if  you no show three or more times for appointments you may be dismissed from the clinic at the providers discretion.     Again, thank you for choosing [facility].  Our hope is that these requests will decrease the amount of time that you wait before being seen by our physicians.       _____________________________________________________________  Should you have questions after your visit to [facility], please contact our office at [phone] and follow the prompts.  Our office hours are 8:00 a.m. and 4:30 p.m. Monday - Friday.  Please note that voicemails left after 4:00 p.m. may not be returned until the following business day.  We are closed weekends and major holidays.  You do have access to a nurse 24-7, just call the main number to the clinic [phone] and do not press any options, hold on the line and a nurse will answer the phone.    For prescription refill requests, have your pharmacy contact our office and allow 72 hours.

## 2022-10-15 NOTE — Hospital Course (Addendum)
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  She had a PICC line placed and this has subsequently been removed since completion of IV antibiotics.  Patient is status post TIPS at [facility] on 08/20/2022.  And she is status post embolization of hemorrhoids 09/05/2022.  Patient is followed closely by gastroenterology and hepatology liver transplant at [facility].  She reportedly had last transfusion 10/09/2022.  She has chronic swelling in the legs and had been on diuretics but she was recently taken off all diuretics around 09/24/2022.  Around that time she was noted to have a small bump in creatinine.  She has had subsequent blood work done that shows that her creatinine is continue to climb and now up to greater than 4.  She does not seem to have symptoms for this now.  Her hematology clinic informed her about the abnormal labs and requested that she come into the hospital for further management. Nephrology was consulted to assist with management.  Her diuretics were initially held and subequently re-introduced.  Her renal function has gradually improved.

## 2022-10-16 ENCOUNTER — Inpatient Hospital Stay: Payer: 59

## 2022-10-16 ENCOUNTER — Observation Stay (HOSPITAL_COMMUNITY): Payer: 59

## 2022-10-16 DIAGNOSIS — D563 Thalassemia minor: Secondary | ICD-10-CM | POA: Diagnosis not present

## 2022-10-16 DIAGNOSIS — E1140 Type 2 diabetes mellitus with diabetic neuropathy, unspecified: Secondary | ICD-10-CM | POA: Diagnosis not present

## 2022-10-16 DIAGNOSIS — D61818 Other pancytopenia: Secondary | ICD-10-CM | POA: Diagnosis not present

## 2022-10-16 DIAGNOSIS — K7290 Hepatic failure, unspecified without coma: Secondary | ICD-10-CM | POA: Diagnosis not present

## 2022-10-16 DIAGNOSIS — E8770 Fluid overload, unspecified: Secondary | ICD-10-CM | POA: Diagnosis not present

## 2022-10-16 DIAGNOSIS — D696 Thrombocytopenia, unspecified: Secondary | ICD-10-CM | POA: Diagnosis not present

## 2022-10-16 DIAGNOSIS — K7581 Nonalcoholic steatohepatitis (NASH): Secondary | ICD-10-CM | POA: Diagnosis not present

## 2022-10-16 DIAGNOSIS — D631 Anemia in chronic kidney disease: Secondary | ICD-10-CM | POA: Diagnosis not present

## 2022-10-16 DIAGNOSIS — N179 Acute kidney failure, unspecified: Secondary | ICD-10-CM | POA: Diagnosis not present

## 2022-10-16 DIAGNOSIS — I959 Hypotension, unspecified: Secondary | ICD-10-CM | POA: Diagnosis not present

## 2022-10-16 DIAGNOSIS — E8779 Other fluid overload: Secondary | ICD-10-CM | POA: Diagnosis not present

## 2022-10-16 DIAGNOSIS — E1122 Type 2 diabetes mellitus with diabetic chronic kidney disease: Secondary | ICD-10-CM | POA: Diagnosis not present

## 2022-10-16 DIAGNOSIS — N189 Chronic kidney disease, unspecified: Secondary | ICD-10-CM | POA: Diagnosis not present

## 2022-10-16 DIAGNOSIS — E869 Volume depletion, unspecified: Secondary | ICD-10-CM | POA: Diagnosis not present

## 2022-10-16 DIAGNOSIS — G4733 Obstructive sleep apnea (adult) (pediatric): Secondary | ICD-10-CM | POA: Diagnosis not present

## 2022-10-16 DIAGNOSIS — E785 Hyperlipidemia, unspecified: Secondary | ICD-10-CM | POA: Diagnosis not present

## 2022-10-16 DIAGNOSIS — E669 Obesity, unspecified: Secondary | ICD-10-CM | POA: Diagnosis not present

## 2022-10-16 DIAGNOSIS — Z8249 Family history of ischemic heart disease and other diseases of the circulatory system: Secondary | ICD-10-CM | POA: Diagnosis not present

## 2022-10-16 DIAGNOSIS — G4700 Insomnia, unspecified: Secondary | ICD-10-CM | POA: Diagnosis not present

## 2022-10-16 DIAGNOSIS — Z79899 Other long term (current) drug therapy: Secondary | ICD-10-CM | POA: Diagnosis not present

## 2022-10-16 DIAGNOSIS — Z8616 Personal history of COVID-19: Secondary | ICD-10-CM | POA: Diagnosis not present

## 2022-10-16 DIAGNOSIS — I10 Essential (primary) hypertension: Secondary | ICD-10-CM | POA: Diagnosis not present

## 2022-10-16 DIAGNOSIS — K7460 Unspecified cirrhosis of liver: Secondary | ICD-10-CM | POA: Diagnosis not present

## 2022-10-16 DIAGNOSIS — E876 Hypokalemia: Secondary | ICD-10-CM | POA: Diagnosis not present

## 2022-10-16 DIAGNOSIS — F32A Depression, unspecified: Secondary | ICD-10-CM | POA: Diagnosis not present

## 2022-10-16 DIAGNOSIS — E559 Vitamin D deficiency, unspecified: Secondary | ICD-10-CM | POA: Diagnosis not present

## 2022-10-16 DIAGNOSIS — I129 Hypertensive chronic kidney disease with stage 1 through stage 4 chronic kidney disease, or unspecified chronic kidney disease: Secondary | ICD-10-CM | POA: Diagnosis not present

## 2022-10-16 DIAGNOSIS — N1832 Chronic kidney disease, stage 3b: Secondary | ICD-10-CM | POA: Diagnosis not present

## 2022-10-16 DIAGNOSIS — R188 Other ascites: Secondary | ICD-10-CM | POA: Diagnosis not present

## 2022-10-16 LAB — CBC
HCT: 23.4 % — ABNORMAL LOW (ref 36.0–46.0)
Hemoglobin: 7.3 g/dL — ABNORMAL LOW (ref 12.0–15.0)
MCH: 25.2 pg — ABNORMAL LOW (ref 26.0–34.0)
MCHC: 31.2 g/dL (ref 30.0–36.0)
MCV: 80.7 fL (ref 80.0–100.0)
Platelets: 68 10*3/uL — ABNORMAL LOW (ref 150–400)
RBC: 2.9 MIL/uL — ABNORMAL LOW (ref 3.87–5.11)
RDW: 22.6 % — ABNORMAL HIGH (ref 11.5–15.5)
WBC: 2.6 10*3/uL — ABNORMAL LOW (ref 4.0–10.5)
nRBC: 0 % (ref 0.0–0.2)

## 2022-10-16 LAB — RENAL FUNCTION PANEL
Albumin: 2.3 g/dL — ABNORMAL LOW (ref 3.5–5.0)
Anion gap: 8 (ref 5–15)
BUN: 67 mg/dL — ABNORMAL HIGH (ref 8–23)
CO2: 19 mmol/L — ABNORMAL LOW (ref 22–32)
Calcium: 7.8 mg/dL — ABNORMAL LOW (ref 8.9–10.3)
Chloride: 110 mmol/L (ref 98–111)
Creatinine, Ser: 4.09 mg/dL — ABNORMAL HIGH (ref 0.44–1.00)
GFR, Estimated: 12 mL/min — ABNORMAL LOW (ref >60)
Glucose, Bld: 109 mg/dL — ABNORMAL HIGH (ref 70–99)
Phosphorus: 6.6 mg/dL — ABNORMAL HIGH (ref 2.5–4.6)
Potassium: 4 mmol/L (ref 3.5–5.1)
Sodium: 137 mmol/L (ref 135–145)

## 2022-10-16 LAB — URINALYSIS, COMPLETE (UACMP) WITH MICROSCOPIC
Bilirubin Urine: NEGATIVE
Glucose, UA: NEGATIVE mg/dL
Ketones, ur: NEGATIVE mg/dL
Nitrite: NEGATIVE
Protein, ur: 30 mg/dL — AB
Specific Gravity, Urine: 1.015 (ref 1.005–1.030)
pH: 5 (ref 5.0–8.0)

## 2022-10-16 LAB — MAGNESIUM: Magnesium: 2 mg/dL (ref 1.7–2.4)

## 2022-10-16 MED ORDER — DIPHENHYDRAMINE HCL 25 MG PO CAPS: 25.0000 mg | ORAL_CAPSULE | Freq: Two times a day (BID) | ORAL | Status: DC | PRN

## 2022-10-16 MED ORDER — ATORVASTATIN CALCIUM 10 MG PO TABS
10.0000 mg | ORAL_TABLET | Freq: Every evening | ORAL | Status: DC
  Administered 2022-10-16 – 2022-10-24 (×9): 10 mg via ORAL
  Filled 2022-10-16 (×9): qty 1

## 2022-10-16 MED ORDER — ALBUMIN HUMAN 25 % IV SOLN
25.0000 g | Freq: Four times a day (QID) | INTRAVENOUS | Status: AC
  Administered 2022-10-16 – 2022-10-18 (×8): 25 g via INTRAVENOUS
  Filled 2022-10-16 (×8): qty 100

## 2022-10-16 MED ORDER — ALBUTEROL SULFATE (2.5 MG/3ML) 0.083% IN NEBU: 2.5000 mg | INHALATION_SOLUTION | Freq: Four times a day (QID) | RESPIRATORY_TRACT | Status: DC | PRN

## 2022-10-16 MED ORDER — MELATONIN 3 MG PO TABS
9.0000 mg | ORAL_TABLET | Freq: Every evening | ORAL | Status: DC | PRN
  Administered 2022-10-22 – 2022-10-24 (×3): 9 mg via ORAL
  Filled 2022-10-16 (×3): qty 3

## 2022-10-16 MED ORDER — CIPROFLOXACIN HCL 250 MG PO TABS
500.0000 mg | ORAL_TABLET | Freq: Every day | ORAL | Status: DC
  Administered 2022-10-16 – 2022-10-25 (×10): 500 mg via ORAL
  Filled 2022-10-16 (×10): qty 2

## 2022-10-16 MED ORDER — ALBUTEROL SULFATE HFA 108 (90 BASE) MCG/ACT IN AERS: 2.0000 | INHALATION_SPRAY | Freq: Four times a day (QID) | RESPIRATORY_TRACT | Status: DC | PRN

## 2022-10-16 MED ORDER — MIDODRINE HCL 5 MG PO TABS
10.0000 mg | ORAL_TABLET | Freq: Three times a day (TID) | ORAL | Status: DC
  Administered 2022-10-16 – 2022-10-25 (×29): 10 mg via ORAL
  Filled 2022-10-16 (×28): qty 2

## 2022-10-16 MED ORDER — SERTRALINE HCL 50 MG PO TABS
50.0000 mg | ORAL_TABLET | Freq: Every day | ORAL | Status: DC
  Administered 2022-10-16 – 2022-10-25 (×10): 50 mg via ORAL
  Filled 2022-10-16 (×10): qty 1

## 2022-10-16 MED ORDER — POLYETHYLENE GLYCOL 3350 17 G PO PACK
17.0000 g | PACK | Freq: Two times a day (BID) | ORAL | Status: DC
  Administered 2022-10-16 – 2022-10-25 (×9): 17 g via ORAL
  Filled 2022-10-16 (×16): qty 1

## 2022-10-16 NOTE — TOC Progression Note (Signed)
  Transition of Care [facility]) Screening Note   Patient Details  Name: Rachel Huber Date of Birth: 1956-04-07   Transition of Care [facility]) CM/SW Contact:    Shade Flood, LCSW Phone Number: 10/16/2022, 9:52 AM    Transition of Care Department [facility]) has reviewed patient and no TOC needs have been identified at this time. We will continue to monitor patient advancement through interdisciplinary progression rounds. If new patient transition needs arise, please place a TOC consult.

## 2022-10-16 NOTE — Consult Note (Signed)
[facility] Renal Consultation Note  Requesting MD: Jonhnson Indication for Consultation: A on CRF  HPI:  Rachel Huber is a 67 y.o. 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  Kidney wise pt appears to have baseline CKD-  most recent crt 1.4 to 1.5 last checked on 2/7-  did have AKI back in August of 23-  peaked in the 2's. She was noted to have an OP visit with hematology on 2/28 for her anemia felt due to lower GI bleeding and also thalassemia-  labs were checked at that appt and crt was 4.3 so she was directed to the hospital, of note had been checked at [facility] on 2/20 and was 3.3. Other labs of note hgb 8.8 ( she had received a transfusion on 2/27 for hgb of 6.9).   BP is soft-  as low as 97/57 here in the hospital. She was felt to be volume depleted-  they stated that they put diuretics on hold but is unclear if she was taking any ( was on lasix) - OP meds otherwise include neurontin 800 BID and midodrine 10 TID ( admits that she doesn't take TID) .  Crt down to 4.0 this AM-  only 150 of UOP recorded-  no U/A or renal u/s done-  she reports normal UOP and says that she feels pretty well except for swelling   Creatinine, Ser  Date/Time Value Ref Range Status  10/16/2022 04:30 AM 4.09 (H) 0.44 - 1.00 mg/dL Final  10/15/2022 09:36 AM 4.31 (H) 0.44 - 1.00 mg/dL Final  09/24/2022 05:00 AM 1.41 (H) 0.44 - 1.00 mg/dL Final  09/23/2022 04:23 AM 1.45 (H) 0.44 - 1.00 mg/dL Final  09/21/2022 04:55 AM 1.52 (H) 0.44 - 1.00 mg/dL Final  09/20/2022 05:08 AM 1.70 (H) 0.44 - 1.00 mg/dL Final  09/19/2022 03:59 AM 1.58 (H) 0.44 - 1.00 mg/dL Final  09/17/2022 09:11 PM 1.41 (H) 0.44 - 1.00  mg/dL Final  09/05/2022 11:08 AM 1.43 (H) 0.44 - 1.00 mg/dL Final  08/13/2022 01:37 PM 1.70 (H) 0.44 - 1.00 mg/dL Final  07/01/2022 02:49 PM 1.50 (H) 0.44 - 1.00 mg/dL Final  06/20/2022 06:26 AM 1.56 (H) 0.44 - 1.00 mg/dL Final  06/18/2022 06:33 AM 1.61 (H) 0.44 - 1.00 mg/dL Final  06/17/2022 03:59 PM 1.73 (H) 0.44 - 1.00 mg/dL Final  05/21/2022 10:07 AM 1.68 (H) 0.44 - 1.00 mg/dL Final  04/02/2022 10:35 AM 1.49 (H) 0.44 - 1.00 mg/dL Final  03/27/2022 04:01 AM 1.20 (H) 0.44 - 1.00 mg/dL Final  03/26/2022 03:13 AM 1.19 (H) 0.44 - 1.00 mg/dL Final  03/25/2022 03:54 AM 1.29 (H) 0.44 - 1.00 mg/dL Final  03/24/2022 03:19 AM 1.43 (H) 0.44 - 1.00 mg/dL Final  03/23/2022 03:29 AM 1.69 (H) 0.44 - 1.00 mg/dL Final  03/22/2022 02:06 AM 1.85 (H) 0.44 - 1.00 mg/dL Final  03/21/2022 06:04 AM 1.94 (H) 0.44 - 1.00 mg/dL Final  03/21/2022 01:20 AM 1.95 (H) 0.44 - 1.00 mg/dL Final  03/20/2022 01:07 PM 2.19 (H) 0.44 - 1.00 mg/dL Final  03/13/2022 01:15 PM 1.68 (H) 0.44 - 1.00 mg/dL Final  03/07/2022 04:47 AM 1.54 (H) 0.44 - 1.00 mg/dL Final  03/06/2022 05:33  AM 1.66 (H) 0.44 - 1.00 mg/dL Final  03/05/2022 04:39 PM 1.58 (H) 0.44 - 1.00 mg/dL Final  02/10/2022 05:17 AM 1.37 (H) 0.44 - 1.00 mg/dL Final  02/08/2022 07:12 AM 1.41 (H) 0.44 - 1.00 mg/dL Final  02/07/2022 05:49 AM 1.53 (H) 0.44 - 1.00 mg/dL Final  02/05/2022 05:15 AM 1.60 (H) 0.44 - 1.00 mg/dL Final  02/04/2022 12:45 PM 1.76 (H) 0.44 - 1.00 mg/dL Final  01/29/2022 04:42 AM 1.65 (H) 0.44 - 1.00 mg/dL Final  01/28/2022 05:07 AM 1.67 (H) 0.44 - 1.00 mg/dL Final  01/27/2022 05:39 PM 1.73 (H) 0.44 - 1.00 mg/dL Final  11/20/2020 08:42 AM 1.50 (H) 0.44 - 1.00 mg/dL Final  10/29/2020 06:43 AM 0.92 0.44 - 1.00 mg/dL Final  10/27/2020 07:38 AM 0.92 0.44 - 1.00 mg/dL Final  10/26/2020 05:45 AM 1.29 (H) 0.44 - 1.00 mg/dL Final  10/25/2020 03:47 PM 1.23 (H) 0.44 - 1.00 mg/dL Final  10/10/2020 03:50 AM 1.43 (H) 0.44 - 1.00 mg/dL Final  10/09/2020 04:29  AM 1.48 (H) 0.44 - 1.00 mg/dL Final  10/08/2020 12:20 PM 1.37 (H) 0.44 - 1.00 mg/dL Final  09/13/2020 06:25 AM 0.98 0.44 - 1.00 mg/dL Final  09/12/2020 09:42 AM 1.01 (H) 0.44 - 1.00 mg/dL Final  09/11/2020 07:02 AM 0.97 0.44 - 1.00 mg/dL Final  09/10/2020 08:27 AM 0.96 0.44 - 1.00 mg/dL Final  09/06/2020 06:14 AM 1.04 (H) 0.44 - 1.00 mg/dL Final  09/05/2020 06:08 AM 1.04 (H) 0.44 - 1.00 mg/dL Final  09/04/2020 07:45 AM 1.05 (H) 0.44 - 1.00 mg/dL Final     PMHx:   Past Medical History:  Diagnosis Date   Anemia in chronic kidney disease (CKD) 02/07/2022   Anxiety    Arthritis    Cirrhosis of liver ([place])    Diabetes mellitus without complication (HCC)    Dyspnea    DOE   GERD (gastroesophageal reflux disease)    Grade IV internal hemorrhoids    Contingency plan for any future admissions for severe anemia in the setting of persistent GI bleed:  nuclear medicine tagged RBC scan to assess for location of GI bleeding - if from rectum, would then proceed to angiogram with possible repeat embolization.  Please notify IR in this case.   Hepatitis    PAST   Hypertension    Neuropathy    Neuropathy, diabetic ([place])    Pneumonia    Sinus complaint    Sleep apnea    CPAP   Thalassemia minor 1992    Past Surgical History:  Procedure Laterality Date   ABDOMINAL HYSTERECTOMY     BREAST BIOPSY Right 02/15/2018   [place] bx 6-6:30 ribbon shape, ONE CORE FRAGMENT WITH FIBROSIS. ONE CORE FRAGMENT WITH PORTION OF A DILATED   BREAST BIOPSY Right 02/15/2018   [place] bx 9:00 heart shape, USUAL DUCTAL HYPERPLASIA   BREAST LUMPECTOMY Right 03/09/2018   Procedure: BREAST LUMPECTOMY x 2;  Surgeon: Benjamine Sprague, DO;  Location: [facility] ORS;  Service: General;  Laterality: Right;   CATARACT EXTRACTION W/PHACO Left 06/11/2016   Procedure: CATARACT EXTRACTION PHACO AND INTRAOCULAR LENS PLACEMENT (IOC);  Surgeon: Estill Cotta, MD;  Location: [facility] ORS;  Service: Ophthalmology;  Laterality: Left;  Lot #  [id number] H US:01:38.6 AP%:26.4 CDE:44.15   CATARACT EXTRACTION W/PHACO Right 06/15/2018   Procedure: CATARACT EXTRACTION PHACO AND INTRAOCULAR LENS PLACEMENT (IOC);  Surgeon: Birder Robson, MD;  Location: [facility] ORS;  Service: Ophthalmology;  Laterality: Right;  [place] 00:38.2 CDE 4.23 Fluid Pack Lot # [id number]  COLONOSCOPIES     COLONOSCOPY WITH PROPOFOL N/A 10/10/2020   Procedure: COLONOSCOPY WITH PROPOFOL;  Surgeon: Lesly Rubenstein, MD;  Location[facility];  Service: Endoscopy;  Laterality: N/A;   COLONOSCOPY WITH PROPOFOL N/A 11/20/2020   Procedure: COLONOSCOPY WITH PROPOFOL;  Surgeon: Lesly Rubenstein, MD;  Location: [facility] ENDOSCOPY;  Service: Endoscopy;  Laterality: N/A;  DM STAT CBC, BMP COVID POSITIVE 09/02/2020   COLONOSCOPY WITH PROPOFOL N/A 06/19/2022   Procedure: COLONOSCOPY WITH PROPOFOL;  Surgeon: Lesly Rubenstein, MD;  Location: [facility] ENDOSCOPY;  Service: Endoscopy;  Laterality: N/A;   CTR     ESOPHAGOGASTRODUODENOSCOPY (EGD) WITH PROPOFOL N/A 10/10/2020   Procedure: ESOPHAGOGASTRODUODENOSCOPY (EGD) WITH PROPOFOL;  Surgeon: Lesly Rubenstein, MD;  Location: [facility] ENDOSCOPY;  Service: Endoscopy;  Laterality: N/A;  COVID POSITIVE 10/08/2020   FLEXIBLE SIGMOIDOSCOPY N/A 02/06/2022   Procedure: FLEXIBLE SIGMOIDOSCOPY;  Surgeon: Lesly Rubenstein, MD;  Location: [facility] ENDOSCOPY;  Service: Endoscopy;  Laterality: N/A;  Patient requests anesthesia   IR ANGIOGRAM SELECTIVE EACH ADDITIONAL VESSEL  02/07/2022   IR ANGIOGRAM SELECTIVE EACH ADDITIONAL VESSEL  02/07/2022   IR ANGIOGRAM SELECTIVE EACH ADDITIONAL VESSEL  09/05/2022   IR ANGIOGRAM VISCERAL SELECTIVE  02/07/2022   IR ANGIOGRAM VISCERAL SELECTIVE  09/05/2022   IR EMBO ART  VEN HEMORR LYMPH EXTRAV  INC GUIDE ROADMAPPING  09/05/2022   IR EMBO ARTERIAL NOT HEMORR HEMANG INC GUIDE ROADMAPPING  02/07/2022   IR IMAGING GUIDED PORT INSERTION  06/13/2022   IR PARACENTESIS  12/17/2021   IR PARACENTESIS  03/25/2022   IR RADIOLOGIST EVAL  & MGMT  03/18/2022   IR RADIOLOGIST EVAL & MGMT  05/07/2022   IR RADIOLOGIST EVAL & MGMT  05/16/2022   IR RADIOLOGIST EVAL & MGMT  10/13/2022   IR US GUIDE VASC ACCESS RIGHT  02/07/2022   IR US GUIDE VASC ACCESS RIGHT  09/05/2022   JOINT REPLACEMENT     KNEE SURGERY Right 09/24/2017   plates and pins   PORT-A-CATH REMOVAL N/A 09/23/2022   Procedure: MINOR REMOVAL PORT-A-CATH;  Surgeon: Aviva Signs, MD;  Location: [facility];  Service: General;  Laterality: N/A;   TEE WITHOUT CARDIOVERSION N/A 09/23/2022   Procedure: TRANSESOPHAGEAL ECHOCARDIOGRAM (TEE);  Surgeon: Arnoldo Lenis, MD;  Location: [facility];  Service: Endoscopy;  Laterality: N/A;   TOTAL SHOULDER ARTHROPLASTY Right 09/27/2015    Family Hx:  Family History  Problem Relation Age of Onset   Breast cancer Mother 66   Lymphoma Mother    Diabetes Father    Kidney cancer Father    Heart disease Father    Diabetes Sister    Breast cancer Sister 69    Social History:  reports that she has never smoked. She has never used smokeless tobacco. She reports that she does not drink alcohol and does not use drugs.  Allergies:  Allergies  Allergen Reactions   Gramineae Pollens Other (See Comments)    Sneezing, Running nose   Latex Rash    Contact rash    Medications: Prior to Admission medications   Medication Sig Start Date End Date Taking? Authorizing Provider  acetaminophen (TYLENOL) 650 MG CR tablet Take 1,300 mg by mouth daily as needed for pain.   Yes [provider]  albuterol (VENTOLIN HFA) 108 (90 Base) MCG/ACT inhaler Inhale 2 puffs into the lungs every 6 (six) hours as needed for wheezing or shortness of breath. 02/10/22  Yes Wieting, Richard, MD  atorvastatin (LIPITOR) 10 MG tablet Take 1 tablet (10  mg total) by mouth daily. 06/20/22  Yes Fritzi Mandes, MD  ciprofloxacin (CIPRO) 500 MG tablet Take 500 mg by mouth daily with breakfast.   Yes [provider]  diphenhydrAMINE (BENADRYL) 25 MG tablet Take 25 mg by  mouth 2 (two) times daily as needed for itching.   Yes [provider]  ergocalciferol (VITAMIN D2) 1.25 MG (50000 UT) capsule Take 1 capsule (50,000 Units total) by mouth every 14 (fourteen) days. 09/01/22  Yes Pennington, Rebekah M, PA-C  gabapentin (NEURONTIN) 400 MG capsule Take 800 mg by mouth 2 (two) times daily. 01/07/18  Yes [provider]  melatonin 5 MG TABS Take 10 mg by mouth at bedtime as needed (sleep).   Yes [provider]  midodrine (PROAMATINE) 10 MG tablet Take 10 mg by mouth 3 (three) times daily. Take 10 mg twice daily 09/24/22 10/24/22 Yes [provider]  Multiple Vitamins-Minerals (MULTIVITAMIN WITH MINERALS) tablet Take 1 tablet by mouth daily.   Yes [provider]  oxymetazoline (AFRIN) 0.05 % nasal spray Place 1 spray into both nostrils daily as needed for congestion.   Yes [provider]  polyethylene glycol (MIRALAX / GLYCOLAX) 17 g packet Take 17 g by mouth 2 (two) times daily. 06/20/22  Yes Fritzi Mandes, MD  sertraline (ZOLOFT) 50 MG tablet Take 50 mg by mouth daily. 12/12/17  Yes [provider]  vitamin B-12 (CYANOCOBALAMIN) 1000 MCG tablet Take 1,000 mcg by mouth daily.   Yes [provider]  witch hazel-glycerin (TUCKS) pad Apply topically as needed for hemorrhoids. 03/07/22  Yes Wieting, Richard, MD  acidophilus (RISAQUAD) CAPS capsule Take 2 capsules by mouth 3 (three) times daily. Patient not taking: Reported on 10/15/2022 09/24/22 10/24/22  Heath Lark D, DO  hydrocortisone (ANUCORT-HC) 25 MG suppository Place 1 suppository (25 mg total) rectally 2 (two) times daily as needed for anal itching or hemorrhoids. Patient not taking: Reported on 10/15/2022 06/20/22   Fritzi Mandes, MD  lidocaine-prilocaine (EMLA) cream Apply 1 Application topically See admin instructions for 1 dose. Apply to port site 1 hour prior to procedure Patient not taking: Reported on 10/15/2022 06/23/22   Tyson Alias, NP    I have  reviewed the patient's current medications.  Labs:  Results for orders placed or performed during the hospital encounter of 10/15/22 (from the past 48 hour(s))  Magnesium     Status: None   Collection Time: 10/15/22  7:59 AM  Result Value Ref Range   Magnesium 2.1 1.7 - 2.4 mg/dL    Comment: Performed at [facility], [address], [place], [place] [address]  Phosphorus     Status: Abnormal   Collection Time: 10/15/22  7:59 AM  Result Value Ref Range   Phosphorus 6.8 (H) 2.5 - 4.6 mg/dL    Comment: Performed at [facility], [address], [place], [place] [address]  Renal function panel     Status: Abnormal   Collection Time: 10/16/22  4:30 AM  Result Value Ref Range   Sodium 137 135 - 145 mmol/L   Potassium 4.0 3.5 - 5.1 mmol/L   Chloride 110 98 - 111 mmol/L   CO2 19 (L) 22 - 32 mmol/L   Glucose, Bld 109 (H) 70 - 99 mg/dL    Comment: Glucose reference range applies only to samples taken after fasting for at least 8 hours.   BUN 67 (H) 8 - 23 mg/dL   Creatinine, Ser 4.09 (H) 0.44 - 1.00 mg/dL   Calcium  7.8 (L) 8.9 - 10.3 mg/dL   Phosphorus 6.6 (H) 2.5 - 4.6 mg/dL   Albumin 2.3 (L) 3.5 - 5.0 g/dL   GFR, Estimated 12 (L) >60 mL/min    Comment: (NOTE) Calculated using the CKD-EPI Creatinine Equation (2021)    Anion gap 8 5 - 15    Comment: Performed at [facility], [address], [place], [place] [address]  Magnesium     Status: None   Collection Time: 10/16/22  4:30 AM  Result Value Ref Range   Magnesium 2.0 1.7 - 2.4 mg/dL    Comment: Performed at [facility], [address] [place], [place] [address]  CBC     Status: Abnormal   Collection Time: 10/16/22  4:30 AM  Result Value Ref Range   WBC 2.6 (L) 4.0 - 10.5 K/uL   RBC 2.90 (L) 3.87 - 5.11 MIL/uL   Hemoglobin 7.3 (L) 12.0 - 15.0 g/dL   HCT 23.4 (L) 36.0 - 46.0 %   MCV 80.7 80.0 - 100.0 fL   MCH 25.2 (L) 26.0 - 34.0 pg   MCHC 31.2 30.0 - 36.0 g/dL   RDW 22.6 (H) 11.5 - 15.5 %   Platelets 68 (L) 150 - 400 K/uL     Comment: SPECIMEN CHECKED FOR CLOTS Immature Platelet Fraction may be clinically indicated, consider ordering this additional test [id number] CONSISTENT WITH PREVIOUS RESULT    nRBC 0.0 0.0 - 0.2 %    Comment: Performed at [facility], [address], [place], [place] [address]     ROS:  A comprehensive review of systems was negative except for: Cardiovascular: positive for lower extremity edema  Physical Exam: Vitals:   10/16/22 0350 10/16/22 0700  BP: (!) 116/54 (!) 125/53  Pulse: 79 83  Resp: 18 20  Temp: 98.7 F (37.1 C) 98.1 F (36.7 C)  SpO2: 97% 98%     General: obese, alert NAD HEENT: PERRLA Neck: no JVD Heart: RRR Lungs: mostly clear Abdomen: distended but also obese Extremities: pitting edema Skin: warm and dry-   some evidence of chronic edema in LEs Neuro: alert, non focal  Assessment/Plan: 67 year old WF with cirrhosis req freq paracentesis-  now presenting with subacute A on CRF since 2/7 in the setting of paracentesis/diuresis and soft BP 1.Renal- crt 1.4 on 2/7-   3.3 on 2/20 and 4.3 on 2/28.  Has had at least 2 large volume paracenteses over this time period as well as being on lasix, large dose neurontin and had lowish BP.  This has all the hallmarks of being a hemodynamic related kidney injury-  on the continuum of HRS.  I will check a U/A and renal ultrasound to rule out other etiologies.  For now I agree with holding high dose neurontin and lasix.  I will add back midodrine and do albumin supplementation the next 48 hours.  Because crt has trended better already I will not add octreotide at this time.  She does not need fluid supplementation 2. Hypertension/volume  - volume overloaded and hypotensive-  will try midodrine and albumin to imrpove renal perfusion 3. Anemia  - anemic but actually better now than has been after transfusion.  I see that pt is resistant to further retacrit as she felt it caused her issue last hospitalization.  I did not push this  agena   Louis Meckel 10/16/2022, 8:03 AM

## 2022-10-16 NOTE — Progress Notes (Signed)
Patient has refused CPAP for tonight.  No machine in room at this time.

## 2022-10-16 NOTE — Progress Notes (Signed)
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  She had a PICC line placed and this has subsequently been removed since completion of IV antibiotics.  Patient is status post TIPS at [facility] on 08/20/2022.  And she is status post embolization of hemorrhoids 09/05/2022.  Patient is followed closely by gastroenterology and hepatology liver transplant at [facility].  She reportedly had last transfusion 10/09/2022.  She has chronic swelling in the legs and had been on diuretics but she was recently taken off all diuretics around 09/24/2022.  Around that time she was noted to have a small bump in creatinine.  She has had subsequent blood work done that shows that her creatinine is continue to climb and now up to greater than 4.  She does not seem to have symptoms for this now.  Her hematology clinic informed her about the abnormal labs and requested that she come into the hospital for further management.   Assessment and Plan:  AKI on CKD stage IIIb -Continue to hold all diuretics at this time -Patient appears to be intravascularly volume depleted initially -Gently hydrate with IV fluid overnight, now off all IV fluid -Renally dose all medications as appropriate, avoiding nephrotoxic agents -ED consulted nephrology who  is following -nephrology team starting IV albumin infusions   Chronic anemia -Secondary to thalassemia disease, managed by hematologist -Secondary to chronic kidney disease   Thrombocytopenia -Secondary chronic liver disease -No active bleeding complications at this time -Follow CBC daily -platelets trending down, recheck in AM    OSA on CPAP -Will offer nightly CPAP while in hospital   Liver cirrhosis secondary to NASH -Patient is followed closely by [facility] hepatology -Patient planning to have teeth removed as a final requirement for liver transplant consideration -Status post recent paracentesis 10/14/2022 where 8 L of fluid removed -IV albumin per nephrology    DVT prophylaxis: TED hoses Code Status: Full  Family Communication:  Disposition: Status is: Inpatient Remains inpatient appropriate because: IV treatments    Consultants:  Nephrology  Procedures:   Antimicrobials:    Subjective: Pt denies CP and SOB.   Objective: Vitals:   10/15/22 1947 10/15/22 2357 10/16/22 0350 10/16/22 0700  BP: (!) 117/41 (!) 116/47 (!) 116/54 (!) 125/53  Pulse: 70 77 79 83  Resp: '18 18 18 20  '$ Temp: 98.1 F (36.7 C) 97.7 F (36.5 C) 98.7 F (37.1 C) 98.1 F (36.7 C)  TempSrc: Oral   Oral  SpO2: 100% 97% 97% 98%  Weight:   130.3 kg   Height:        Intake/Output Summary (Last 24 hours) at 10/16/2022 1253 Last data filed at 10/16/2022 0800 Gross per 24 hour  Intake 317.05 ml  Output 150 ml  Net 167.05 ml   Filed Weights   10/15/22 1117 10/16/22 0350  Weight: 127 kg 130.3 kg   Examination:  General exam: Appears calm and  comfortable  Respiratory system: Clear to auscultation. Respiratory effort normal. Cardiovascular system: normal S1 & S2 heard. No JVD, murmurs, rubs, gallops or clicks. No pedal edema. Gastrointestinal system: Abdomen is nondistended, soft and nontender. No organomegaly or masses felt. Normal bowel sounds heard. Central nervous system: Alert and  oriented. No focal neurological deficits. Extremities: Symmetric 5 x 5 power. Skin: No rashes, lesions or ulcers. Psychiatry: Judgement and insight appear normal. Mood & affect appropriate.   Data Reviewed: I have personally reviewed following labs and imaging studies  CBC: Recent Labs  Lab 10/15/22 0759 10/16/22 0430  WBC 3.2* 2.6*  NEUTROABS 2.0  --   HGB 8.8* 7.3*  HCT 28.3* 23.4*  MCV 81.8 80.7  PLT 82* 68*    Basic Metabolic Panel: Recent Labs  Lab 10/15/22 0759 10/15/22 0936 10/16/22 0430  NA  --  136 137  K  --  4.3 4.0  CL  --  109 110  CO2  --  19* 19*  GLUCOSE  --  114* 109*  BUN  --  69* 67*  CREATININE  --  4.31* 4.09*  CALCIUM  --  8.0* 7.8*  MG 2.1  --  2.0  PHOS 6.8*  --  6.6*    CBG: No results for input(s): "GLUCAP" in the last 168 hours.  No results found for this or any previous visit (from the past 240 hour(s)).   Radiology Studies: US RENAL  Result Date: 10/16/2022 CLINICAL DATA:  Impaired renal function EXAM: RENAL / URINARY TRACT ULTRASOUND COMPLETE COMPARISON:  None Available. FINDINGS: Right Kidney: Length: 13.0 cm. Echogenicity within normal limits. No mass or hydronephrosis visualized. Left Kidney: Length: 11.4 cm. Echogenicity within normal limits. No mass or hydronephrosis visualized. Bladder: Appears normal for degree of bladder distention. Other: Note is made of large ascites in the lower abdomen. IMPRESSION: Unremarkable examination of the kidneys. Large ascites noted incidentally. Electronically Signed   By: Sammie Bench M.D.   On: 10/16/2022 10:23   US Paracentesis  Result Date: 10/14/2022 INDICATION: Refractory ascites in patient with history of NASH cirrhosis status post TIPS procedure May 2023 and TIPS revision July 2023 at [facility]. She recently underwent second TIPS revision 08/20/2022. Request received for therapeutic paracentesis with 8 L max. EXAM: ULTRASOUND GUIDED THERAPEUTIC RIGHT UPPER QUADRANT PARACENTESIS MEDICATIONS:  10 mL 1 % lidocaine COMPLICATIONS: None immediate. PROCEDURE: Informed written consent was obtained from the patient after a discussion of the risks, benefits and alternatives to treatment. A timeout was performed prior to the initiation of the procedure. Initial ultrasound scanning demonstrates a moderate amount of ascites within the right upper abdominal quadrant. The right upper abdomen was prepped and draped in the usual sterile fashion. 1% lidocaine was used for local anesthesia. Following this, a 19 gauge, 10-cm, Yueh catheter was introduced. An ultrasound image was saved for documentation purposes. The paracentesis was performed. The catheter was removed and a dressing was applied. The patient tolerated the procedure well without immediate post procedural complication. Patient received post-procedure intravenous albumin; see nursing notes for details. FINDINGS: A total of approximately 8 L of clear, yellow fluid was removed. IMPRESSION: Successful ultrasound-guided paracentesis yielding 8 liters of peritoneal fluid. Read by: Narda Rutherford, AGNP-BC Electronically Signed   By: Ruthann Cancer M.D.   On: 10/14/2022 13:49    Scheduled Meds:  midodrine  10 mg Oral TID WC   Continuous Infusions:  albumin human 25 g (10/16/22 0929)    LOS: 0 days   Time spent: 64  mins  Irwin Brakeman, MD How to contact the [facility] Attending or Consulting provider [place] or covering provider during after hours [place], for this patient?  Check the care team in [facility] and look for a) attending/consulting [facility] provider listed and b) the [facility] team listed Log into [email or web address] and use [place] universal password to access. If you do not have the password, please contact the hospital operator. Locate the [facility] provider you are looking for under [facility] and page to a number that you can be directly reached. If you still have difficulty reaching the provider, please page the [facility] (Director on Call) for the Hospitalists  listed on amion for assistance.  10/16/2022, 12:53 PM

## 2022-10-17 DIAGNOSIS — N179 Acute kidney failure, unspecified: Secondary | ICD-10-CM | POA: Diagnosis not present

## 2022-10-17 DIAGNOSIS — G4733 Obstructive sleep apnea (adult) (pediatric): Secondary | ICD-10-CM | POA: Diagnosis not present

## 2022-10-17 DIAGNOSIS — N1832 Chronic kidney disease, stage 3b: Secondary | ICD-10-CM | POA: Diagnosis not present

## 2022-10-17 DIAGNOSIS — D696 Thrombocytopenia, unspecified: Secondary | ICD-10-CM | POA: Diagnosis not present

## 2022-10-17 LAB — HEPATIC FUNCTION PANEL
ALT: 8 U/L (ref 0–44)
AST: 47 U/L — ABNORMAL HIGH (ref 15–41)
Albumin: 2.8 g/dL — ABNORMAL LOW (ref 3.5–5.0)
Alkaline Phosphatase: 79 U/L (ref 38–126)
Bilirubin, Direct: 0.5 mg/dL — ABNORMAL HIGH (ref 0.0–0.2)
Indirect Bilirubin: 1 mg/dL — ABNORMAL HIGH (ref 0.3–0.9)
Total Bilirubin: 1.5 mg/dL — ABNORMAL HIGH (ref 0.3–1.2)
Total Protein: 4.8 g/dL — ABNORMAL LOW (ref 6.5–8.1)

## 2022-10-17 LAB — CBC
HCT: 22.4 % — ABNORMAL LOW (ref 36.0–46.0)
Hemoglobin: 7 g/dL — ABNORMAL LOW (ref 12.0–15.0)
MCH: 25.4 pg — ABNORMAL LOW (ref 26.0–34.0)
MCHC: 31.3 g/dL (ref 30.0–36.0)
MCV: 81.2 fL (ref 80.0–100.0)
Platelets: 62 10*3/uL — ABNORMAL LOW (ref 150–400)
RBC: 2.76 MIL/uL — ABNORMAL LOW (ref 3.87–5.11)
RDW: 22.7 % — ABNORMAL HIGH (ref 11.5–15.5)
WBC: 2.8 10*3/uL — ABNORMAL LOW (ref 4.0–10.5)
nRBC: 0 % (ref 0.0–0.2)

## 2022-10-17 LAB — RENAL FUNCTION PANEL
Albumin: 2.9 g/dL — ABNORMAL LOW (ref 3.5–5.0)
Anion gap: 8 (ref 5–15)
BUN: 70 mg/dL — ABNORMAL HIGH (ref 8–23)
CO2: 18 mmol/L — ABNORMAL LOW (ref 22–32)
Calcium: 7.8 mg/dL — ABNORMAL LOW (ref 8.9–10.3)
Chloride: 112 mmol/L — ABNORMAL HIGH (ref 98–111)
Creatinine, Ser: 3.74 mg/dL — ABNORMAL HIGH (ref 0.44–1.00)
GFR, Estimated: 13 mL/min — ABNORMAL LOW (ref >60)
Glucose, Bld: 90 mg/dL (ref 70–99)
Phosphorus: 6.1 mg/dL — ABNORMAL HIGH (ref 2.5–4.6)
Potassium: 3.9 mmol/L (ref 3.5–5.1)
Sodium: 138 mmol/L (ref 135–145)

## 2022-10-17 LAB — AMMONIA: Ammonia: 95 umol/L — ABNORMAL HIGH (ref 9–35)

## 2022-10-17 LAB — MAGNESIUM: Magnesium: 2 mg/dL (ref 1.7–2.4)

## 2022-10-17 MED ORDER — LACTULOSE 10 GM/15ML PO SOLN
20.0000 g | Freq: Two times a day (BID) | ORAL | Status: DC
  Filled 2022-10-17 (×5): qty 30

## 2022-10-17 NOTE — H&P (Incomplete)
Chief Complaint: Patient was seen in consultation today for poor peripheral access at the request of Tarri Abernethy  Referring Physician(s): Tarri Abernethy  Supervising Physician: Juliet Rude  Patient Status: [facility] - Out-pt  History of Present Illness: Rachel Huber is Huber 67 y.o. female followed by oncology for IDA and thalassemia. She requires regular paracentesis with albumin infusion as well as frequent blood transfusions. She was admitted for sepsis secondary to bacteremia 09/17/2022 to 09/24/2022. During that admission ID recommended removal of port. She received treatment through PICC but this was removed by home health after treatment was completed. She has been referred to IR for tunneled catheter with port insertion due to poor peripheral access.   Past Medical History:  Diagnosis Date   Anemia in chronic kidney disease (CKD) 02/07/2022   Anxiety    Arthritis    Cirrhosis of liver ([place])    Diabetes mellitus without complication (HCC)    Dyspnea    DOE   GERD (gastroesophageal reflux disease)    Grade IV internal hemorrhoids    Contingency plan for any future admissions for severe anemia in the setting of persistent GI bleed:  nuclear medicine tagged RBC scan to assess for location of GI bleeding - if from rectum, would then proceed to angiogram with possible repeat embolization.  Please notify IR in this case.   Hepatitis    PAST   Hypertension    Neuropathy    Neuropathy, diabetic ([place])    Pneumonia    Sinus complaint    Sleep apnea    CPAP   Thalassemia minor 1992    Past Surgical History:  Procedure Laterality Date   ABDOMINAL HYSTERECTOMY     BREAST BIOPSY Right 02/15/2018   [place] bx 6-6:30 ribbon shape, ONE CORE FRAGMENT WITH FIBROSIS. ONE CORE FRAGMENT WITH PORTION OF Huber DILATED   BREAST BIOPSY Right 02/15/2018   [place] bx 9:00 heart shape, USUAL DUCTAL HYPERPLASIA   BREAST LUMPECTOMY Right 03/09/2018   Procedure: BREAST LUMPECTOMY x 2;  Surgeon:  Benjamine Sprague, DO;  Location: [facility] ORS;  Service: General;  Laterality: Right;   CATARACT EXTRACTION W/PHACO Left 06/11/2016   Procedure: CATARACT EXTRACTION PHACO AND INTRAOCULAR LENS PLACEMENT (IOC);  Surgeon: Estill Cotta, MD;  Location: [facility] ORS;  Service: Ophthalmology;  Laterality: Left;  Lot # [id number] H US:01:38.6 AP%:26.4 CDE:44.15   CATARACT EXTRACTION W/PHACO Right 06/15/2018   Procedure: CATARACT EXTRACTION PHACO AND INTRAOCULAR LENS PLACEMENT (IOC);  Surgeon: Birder Robson, MD;  Location: [facility] ORS;  Service: Ophthalmology;  Laterality: Right;  [place] 00:38.2 CDE 4.23 Fluid Pack Lot # [id number]   COLONOSCOPIES     COLONOSCOPY WITH PROPOFOL N/Huber 10/10/2020   Procedure: COLONOSCOPY WITH PROPOFOL;  Surgeon: Lesly Rubenstein, MD;  Location: [facility] ENDOSCOPY;  Service: Endoscopy;  Laterality: N/Huber;   COLONOSCOPY WITH PROPOFOL N/Huber 11/20/2020   Procedure: COLONOSCOPY WITH PROPOFOL;  Surgeon: Lesly Rubenstein, MD;  Location: [facility] ENDOSCOPY;  Service: Endoscopy;  Laterality: N/Huber;  DM STAT CBC, BMP COVID POSITIVE 09/02/2020   COLONOSCOPY WITH PROPOFOL N/Huber 06/19/2022   Procedure: COLONOSCOPY WITH PROPOFOL;  Surgeon: Lesly Rubenstein, MD;  Location: [facility] ENDOSCOPY;  Service: Endoscopy;  Laterality: N/Huber;   CTR     ESOPHAGOGASTRODUODENOSCOPY (EGD) WITH PROPOFOL N/Huber 10/10/2020   Procedure: ESOPHAGOGASTRODUODENOSCOPY (EGD) WITH PROPOFOL;  Surgeon: Lesly Rubenstein, MD;  Location: [facility] ENDOSCOPY;  Service: Endoscopy;  Laterality: N/Huber;  COVID POSITIVE 10/08/2020   FLEXIBLE SIGMOIDOSCOPY N/Huber 02/06/2022   Procedure: FLEXIBLE SIGMOIDOSCOPY;  Surgeon: Haig Prophet,  Hilton Cork, MD;  Location: [facility] ENDOSCOPY;  Service: Endoscopy;  Laterality: N/Huber;  Patient requests anesthesia   IR ANGIOGRAM SELECTIVE EACH ADDITIONAL VESSEL  02/07/2022   IR ANGIOGRAM SELECTIVE EACH ADDITIONAL VESSEL  02/07/2022   IR ANGIOGRAM SELECTIVE EACH ADDITIONAL VESSEL  09/05/2022   IR ANGIOGRAM VISCERAL SELECTIVE  02/07/2022   IR  ANGIOGRAM VISCERAL SELECTIVE  09/05/2022   IR EMBO ART  VEN HEMORR LYMPH EXTRAV  INC GUIDE ROADMAPPING  09/05/2022   IR EMBO ARTERIAL NOT HEMORR HEMANG INC GUIDE ROADMAPPING  02/07/2022   IR IMAGING GUIDED PORT INSERTION  06/13/2022   IR PARACENTESIS  12/17/2021   IR PARACENTESIS  03/25/2022   IR RADIOLOGIST EVAL & MGMT  03/18/2022   IR RADIOLOGIST EVAL & MGMT  05/07/2022   IR RADIOLOGIST EVAL & MGMT  05/16/2022   IR RADIOLOGIST EVAL & MGMT  10/13/2022   IR US GUIDE VASC ACCESS RIGHT  02/07/2022   IR US GUIDE VASC ACCESS RIGHT  09/05/2022   JOINT REPLACEMENT     KNEE SURGERY Right 09/24/2017   plates and pins   PORT-Huber-CATH REMOVAL N/Huber 09/23/2022   Procedure: MINOR REMOVAL PORT-Huber-CATH;  Surgeon: Aviva Signs, MD;  Location: [facility] OR[facility];  Service: General;  Laterality: N/Huber;   TEE WITHOUT CARDIOVERSION N/Huber 09/23/2022   Procedure: TRANSESOPHAGEAL ECHOCARDIOGRAM (TEE);  Surgeon: Arnoldo Lenis, MD;  Location: [facility] ORS;  Service: Endoscopy;  Laterality: N/Huber;   TOTAL SHOULDER ARTHROPLASTY Right 09/27/2015    Allergies: Gramineae pollens and Latex  Medications: Prior to Admission medications   Medication Sig Start Date End Date Taking? Authorizing Provider  acetaminophen (TYLENOL) 650 MG CR tablet Take 1,300 mg by mouth daily as needed for pain.   Yes [provider]  albuterol (VENTOLIN HFA) 108 (90 Base) MCG/ACT inhaler Inhale 2 puffs into the lungs every 6 (six) hours as needed for wheezing or shortness of breath. 02/10/22  Yes Wieting, Richard, MD  atorvastatin (LIPITOR) 10 MG tablet Take 1 tablet (10 mg total) by mouth daily. 06/20/22  Yes Fritzi Mandes, MD  ciprofloxacin (CIPRO) 500 MG tablet Take 500 mg by mouth daily with breakfast.   Yes [provider]  diphenhydrAMINE (BENADRYL) 25 MG tablet Take 25 mg by mouth 2 (two) times daily as needed for itching.   Yes [provider]  ergocalciferol (VITAMIN D2) 1.25 MG (50000 UT) capsule Take 1 capsule (50,000 Units total) by mouth  every 14 (fourteen) days. 09/01/22  Yes Pennington, Rebekah M, PA-C  gabapentin (NEURONTIN) 400 MG capsule Take 800 mg by mouth 2 (two) times daily. 01/07/18  Yes [provider]  melatonin 5 MG TABS Take 10 mg by mouth at bedtime as needed (sleep).   Yes [provider]  midodrine (PROAMATINE) 10 MG tablet Take 10 mg by mouth 3 (three) times daily. Take 10 mg twice daily 09/24/22 10/24/22 Yes [provider]  Multiple Vitamins-Minerals (MULTIVITAMIN WITH MINERALS) tablet Take 1 tablet by mouth daily.   Yes [provider]  oxymetazoline (AFRIN) 0.05 % nasal spray Place 1 spray into both nostrils daily as needed for congestion.   Yes [provider]  polyethylene glycol (MIRALAX / GLYCOLAX) 17 g packet Take 17 g by mouth 2 (two) times daily. 06/20/22  Yes Fritzi Mandes, MD  sertraline (ZOLOFT) 50 MG tablet Take 50 mg by mouth daily. 12/12/17  Yes [provider]  vitamin B-12 (CYANOCOBALAMIN) 1000 MCG tablet Take 1,000 mcg by mouth daily.   Yes [provider]  witch  hazel-glycerin (TUCKS) pad Apply topically as needed for hemorrhoids. 03/07/22  Yes Loletha Grayer, MD     Family History  Problem Relation Age of Onset   Breast cancer Mother 66   Lymphoma Mother    Diabetes Father    Kidney cancer Father    Heart disease Father    Diabetes Sister    Breast cancer Sister 29    Social History   Socioeconomic History   Marital status: Married    Spouse name: Not on file   Number of children: 0   Years of education: Not on file   Highest education level: Not on file  Occupational History   Occupation: EMPLOYED  Tobacco Use   Smoking status: Never   Smokeless tobacco: Never  Vaping Use   Vaping Use: Never used  Substance and Sexual Activity   Alcohol use: No   Drug use: Never   Sexual activity: Not Currently  Other Topics Concern   Not on file  Social History Narrative   Not on file   Social Determinants of Health    Financial Resource Strain: Low Risk  (04/04/2022)   Overall Financial Resource Strain (CARDIA)    Difficulty of Paying Living Expenses: Not hard at all  Food Insecurity: No Food Insecurity (10/15/2022)   Hunger Vital Sign    Worried About Running Out of Food in the Last Year: Never true    [place] in the Last Year: Never true  Transportation Needs: No Transportation Needs (10/15/2022)   PRAPARE - Hydrologist (Medical): No    Lack of Transportation (Non-Medical): No  Physical Activity: Inactive (04/04/2022)   Exercise Vital Sign    Days of Exercise per Week: 0 days    Minutes of Exercise per Session: 0 min  Stress: No Stress Concern Present (04/04/2022)   [place]    Feeling of Stress : Only Huber little  Social Connections: Moderately Isolated (04/04/2022)   Social Connection and Isolation Panel [NHANES]    Frequency of Communication with Friends and Family: More than three times Huber week    Frequency of Social Gatherings with Friends and Family: Three times Huber week    Attends Religious Services: Never    Active Member of Clubs or Organizations: No    Attends Archivist Meetings: Never    Marital Status: Married     Review of Systems: Huber 12 point ROS discussed and pertinent positives are indicated in the HPI above.  All other systems are negative.  Review of Systems  Vital Signs: BP (!) 127/55 (BP Location: Right Arm)   Pulse 76   Temp 98.2 F (36.8 C) (Oral)   Resp 18   Ht '5\' 7"'$  (1.702 m)   Wt 282 lb 3 oz (128 kg)   SpO2 96%   BMI 44.20 kg/m     Physical Exam  Imaging: US RENAL  Result Date: 10/16/2022 CLINICAL DATA:  Impaired renal function EXAM: RENAL / URINARY TRACT ULTRASOUND COMPLETE COMPARISON:  None Available. FINDINGS: Right Kidney: Length: 13.0 cm. Echogenicity within normal limits. No mass or hydronephrosis visualized. Left Kidney: Length: 11.4 cm.  Echogenicity within normal limits. No mass or hydronephrosis visualized. Bladder: Appears normal for degree of bladder distention. Other: Note is made of large ascites in the lower abdomen. IMPRESSION: Unremarkable examination of the kidneys. Large ascites noted incidentally. Electronically Signed   By: Sammie Bench M.D.   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 She recently underwent second TIPS revision 08/20/2022. Request received for therapeutic paracentesis with 8 L max. EXAM: ULTRASOUND GUIDED THERAPEUTIC RIGHT UPPER QUADRANT PARACENTESIS MEDICATIONS: 10 mL 1 % lidocaine COMPLICATIONS: None immediate. PROCEDURE: Informed written consent was obtained from the patient after Huber discussion of the risks, benefits and alternatives to treatment. Huber timeout was performed prior to the initiation of the procedure. Initial ultrasound scanning demonstrates Huber moderate amount of ascites within the right upper abdominal quadrant. The right upper abdomen was prepped and draped in the usual sterile fashion. 1% lidocaine was used for local anesthesia. Following this, Huber 19 gauge, 10-cm, Yueh catheter was introduced. An ultrasound image was saved for documentation purposes. The paracentesis was performed. The catheter was removed and Huber dressing was applied. The patient tolerated the procedure well without immediate post procedural complication. Patient received post-procedure intravenous albumin; see nursing notes for details. FINDINGS: Huber total of approximately 8 L of clear, yellow fluid was removed. IMPRESSION: Successful ultrasound-guided paracentesis yielding 8 liters of peritoneal fluid. Read by: Narda Rutherford, AGNP-BC Electronically Signed   By: Ruthann Cancer M.D.   On: 10/14/2022 13:49   IR Radiologist Eval & Mgmt  Result Date: 10/13/2022 EXAM:  ESTABLISHED PATIENT OFFICE VISIT CHIEF COMPLAINT: See Epic note. HISTORY OF PRESENT ILLNESS: See Epic note. REVIEW OF SYSTEMS: See Epic note. PHYSICAL EXAMINATION: See Epic note. ASSESSMENT AND PLAN: See Epic note. Ruthann Cancer, MD [facility] Electronically Signed   By: Ruthann Cancer M.D.   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 She recently underwent Huber second TIPS revision 08/20/2022. Request received for therapeutic paracentesis. EXAM: ULTRASOUND GUIDED therapeutic left lower quadrant PARACENTESIS MEDICATIONS: 10 cc 1% lidocaine COMPLICATIONS: None immediate. PROCEDURE: Informed written consent was obtained from the patient after Huber discussion of the risks, benefits and alternatives to treatment. Huber timeout was performed prior to the initiation of the procedure. Initial ultrasound scanning demonstrates Huber large amount of ascites within the left lower abdominal quadrant. The left lower abdomen was prepped and draped in the usual sterile fashion. 1% lidocaine was used for local anesthesia. Following this, Huber 19 gauge, 10-cm, Yueh catheter was introduced. An ultrasound image was saved for documentation purposes. The paracentesis was performed. The catheter was removed and Huber dressing was applied. The patient tolerated the procedure well without immediate post procedural complication. Patient received post-procedure intravenous albumin; see nursing notes for details. FINDINGS: Huber total of approximately 6 L of yellow fluid was removed. IMPRESSION: Successful ultrasound-guided paracentesis yielding 6 liters of peritoneal fluid. PLAN: Patient has management of TIPS done by [facility] hospital. Read by: Reatha Armour, PA-C Electronically Signed   By: Miachel Roux M.D.   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 She recently underwent Huber second TIPS revision 08/20/2022. Request received for therapeutic paracentesis with 8 L max. EXAM: ULTRASOUND GUIDED THERAPEUTIC RIGHT UPPER QUADRANT PARACENTESIS MEDICATIONS: 10 mL 1 % lidocaine COMPLICATIONS: None immediate. PROCEDURE: Informed written consent was obtained from the patient  after Huber discussion of the risks, benefits and alternatives to treatment. Huber timeout was performed prior to the initiation of the procedure. Initial ultrasound scanning demonstrates Huber moderate amount of ascites within the right upper abdominal quadrant. The right upper abdomen was prepped and draped in the usual sterile fashion. 1% lidocaine was used for local anesthesia. Following this, Huber 19 gauge, 10-cm, Yueh catheter was introduced. An ultrasound image was saved for documentation purposes. The paracentesis was performed. The catheter was removed and Huber dressing was applied. The patient tolerated the procedure well without immediate post procedural complication. Patient received post-procedure intravenous albumin; see nursing notes for details. FINDINGS: Huber total of approximately 8 L of clear, yellow fluid was removed. IMPRESSION: Successful ultrasound-guided paracentesis yielding 8 liters of peritoneal fluid. Read by: Narda Rutherford, AGNP-BC Electronically Signed   By: Albin Felling M.D.   On: 09/30/2022 13:42   ECHO TEE  Result Date: 09/23/2022    TRANSESOPHOGEAL ECHO REPORT   Patient Name:   Rachel Huber Console Date of Exam: 09/23/2022 Medical Rec #:  [id number]      Height:       68.0 in Accession #:    [id number]     Weight:       315.9 lb Date of Birth:  Sep 08, 1955       BSA:          2.482 m Patient Age:    72 years       BP:           118/49 mmHg Patient Gender: F              HR:           78 bpm. Exam Location:  [facility] Procedure: Transesophageal Echo, Cardiac Doppler and Color Doppler Indications:    Bacteremia R78.81  History:        Patient has prior history of Echocardiogram examinations, most                 recent 10/11/2022. Risk Factors:Hypertension, Diabetes and                 Dyslipidemia. Thalassemia minor, anemia in CKD, cirrhosis NASH.                 OSA on CPAP.  Sonographer:    Alvino Chapel RCS Referring Phys: [phone] [place]: The transesophogeal probe was passed without difficulty through the esophogus of the patient. Sedation performed by different physician. Image quality was excellent. The patient developed no complications during the procedure.  IMPRESSIONS  1. Left ventricular ejection fraction, by estimation, is 60 to 65%. The left ventricle has normal function.  2. Right ventricular systolic function is normal. The right ventricular size is normal.  3. Left atrial size was mildly dilated. No left atrial/left atrial appendage thrombus was detected. The LAA emptying velocity was 82 cm/s.  4. Right atrial size was mildly dilated.  5. Moderate mitral regurgitation, MR vena contracta is 0.4 cm. . The mitral valve is abnormal. Mild to moderate mitral valve regurgitation. No evidence of mitral stenosis.  6. The tricuspid valve is abnormal.  7. The aortic valve is tricuspid. Aortic valve regurgitation is not visualized. No aortic stenosis is present. Conclusion(s)/Recommendation(s): No evidence of vegetation/infective endocarditis on this transesophageael echocardiogram. FINDINGS  Left Ventricle: Left ventricular ejection fraction, by estimation, is 60 to 65%. The left ventricle has normal function. The left ventricular internal cavity size was normal in size. Right  Ventricle: The right ventricular size is normal. No increase in right ventricular wall thickness. Right ventricular systolic function is normal. Left Atrium: Left atrial size was mildly dilated. No left atrial/left atrial appendage  thrombus was detected. The LAA emptying velocity was 82 cm/s. Right Atrium: Right atrial size was mildly dilated. Pericardium: There is no evidence of pericardial effusion. Mitral Valve: Moderate mitral regurgitation, MR vena contracta is 0.4 cm. The mitral valve is abnormal. Mild to moderate mitral valve regurgitation. No evidence of mitral valve stenosis. Tricuspid Valve: The tricuspid valve is abnormal. Tricuspid valve regurgitation is mild . No evidence of tricuspid stenosis. Aortic Valve: The aortic valve is tricuspid. Aortic valve regurgitation is not visualized. No aortic stenosis is present. Pulmonic Valve: The pulmonic valve was normal in structure. Pulmonic valve regurgitation is not visualized. No evidence of pulmonic stenosis. Aorta: The aortic root is normal in size and structure. IAS/Shunts: No atrial level shunt detected by color flow Doppler.   AORTA Ao Root diam: 3.30 cm Carlyle Dolly MD Electronically signed by Carlyle Dolly MD Signature Date/Time: 09/23/2022/12:06:40 PM    Final    US Paracentesis  Result Date: 09/22/2022 INDICATION: Cirrhosis, ascites EXAM: ULTRASOUND GUIDED DIAGNOSTIC AND THERAPEUTIC PARACENTESIS MEDICATIONS: None. COMPLICATIONS: None immediate. PROCEDURE: Informed written consent was obtained from the patient after Huber discussion of the risks, benefits and alternatives to treatment. Huber timeout was performed prior to the initiation of the procedure. Initial ultrasound scanning demonstrates Huber large amount of ascites within the LEFT lower abdominal quadrant. The right lower abdomen was prepped and draped in the usual sterile fashion. 1% lidocaine was used for local anesthesia. Following this, Huber 19 gauge, 15-cm, Yueh catheter was introduced. An ultrasound image was saved for documentation purposes. The paracentesis was performed. The catheter was removed and Huber dressing was applied. The patient tolerated the procedure well without immediate post procedural complication. FINDINGS:  Huber total of approximately 7 L of yellow ascitic fluid was removed. Samples were sent to the laboratory as requested by the clinical team. IMPRESSION: Successful ultrasound-guided paracentesis yielding 7 liters of peritoneal fluid. Electronically Signed   By: Lavonia Dana M.D.   On: 09/22/2022 12:14   [place] EKG SITE RITE  Result Date: 09/22/2022 If Site Rite image not attached, placement could not be confirmed due to current cardiac rhythm.  ECHOCARDIOGRAM COMPLETE  Result Date: 09/21/2022    ECHOCARDIOGRAM REPORT   Patient Name:   Rachel Huber Mittman Date of Exam: 09/21/2022 Medical Rec #:  [id number]      Height:       68.0 in Accession #:    [id number]     Weight:       300.4 lb Date of Birth:  1955/12/24       BSA:          2.429 m Patient Age:    12 years       BP:           122/59 mmHg Patient Gender: F              HR:           68 bpm. Exam Location:  [facility] Procedure: 2D Echo, Cardiac Doppler and Color Doppler Indications:    Bacteremia  History:        Patient has prior history of Echocardiogram examinations, most                 recent 10/26/2020. Signs/Symptoms:Dyspnea; Risk Factors:Sleep  Apnea, Hypertension and Diabetes. Thalassemia minor, anemia in                 CKD, cirrhosis NASH.  Sonographer:    Eartha Inch Referring Phys: [id number] [place]  Sonographer Comments: Image acquisition challenging due to patient body habitus and Image acquisition challenging due to respiratory motion. IMPRESSIONS  1. Left ventricular ejection fraction, by estimation, is 65 to 70%. Left ventricular ejection fraction by 2D MOD biplane is 66.9 %. The left ventricle has normal function. The left ventricle has no regional wall motion abnormalities. The left ventricular internal cavity size was mildly dilated. Left ventricular diastolic parameters were normal.  2. Right ventricular systolic function is normal. The right ventricular size is normal. Tricuspid regurgitation signal is inadequate for  assessing PA pressure.  3. Left atrial size was moderately dilated.  4. The mitral valve is abnormal. Mild mitral valve regurgitation.  5. The aortic valve is tricuspid. Aortic valve regurgitation is not visualized.  6. The inferior vena cava is dilated in size with <50% respiratory variability, suggesting right atrial pressure of 15 mmHg. Comparison(s): Changes from prior study are noted. 10/26/2020: LVEF 55-60%. Conclusion(s)/Recommendation(s): No evidence of valvular vegetations on this transthoracic echocardiogram. Consider Huber transesophageal echocardiogram to exclude infective endocarditis if clinically indicated. FINDINGS  Left Ventricle: Left ventricular ejection fraction, by estimation, is 65 to 70%. Left ventricular ejection fraction by 2D MOD biplane is 66.9 %. The left ventricle has normal function. The left ventricle has no regional wall motion abnormalities. The left ventricular internal cavity size was mildly dilated. There is no left ventricular hypertrophy. Left ventricular diastolic parameters were normal. Right Ventricle: The right ventricular size is normal. No increase in right ventricular wall thickness. Right ventricular systolic function is normal. Tricuspid regurgitation signal is inadequate for assessing PA pressure. Left Atrium: Left atrial size was moderately dilated. Right Atrium: Right atrial size was normal in size. Pericardium: There is no evidence of pericardial effusion. Mitral Valve: The mitral valve is abnormal. Mild to moderate mitral annular calcification. Mild mitral valve regurgitation. Tricuspid Valve: The tricuspid valve is not well visualized. Tricuspid valve regurgitation is not demonstrated. Aortic Valve: The aortic valve is tricuspid. Aortic valve regurgitation is not visualized. Aortic valve mean gradient measures 8.0 mmHg. Aortic valve peak gradient measures 18.0 mmHg. Aortic valve area, by VTI measures 1.96 cm. Pulmonic Valve: The pulmonic valve was grossly normal.  Pulmonic valve regurgitation is trivial. Aorta: The aortic root and ascending aorta are structurally normal, with no evidence of dilitation. Venous: The inferior vena cava is dilated in size with less than 50% respiratory variability, suggesting right atrial pressure of 15 mmHg. IAS/Shunts: No atrial level shunt detected by color flow Doppler.  LEFT VENTRICLE PLAX 2D                        Biplane EF (MOD) LVIDd:         6.00 cm         LV Biplane EF:   Left LVIDs:         3.70 cm                          ventricular LV PW:         1.00 cm                          ejection LV IVS:  0.80 cm                          fraction by LVOT diam:     2.00 cm                          2D MOD LV SV:         90                               biplane is LV SV Index:   37                               66.9 %. LVOT Area:     3.14 cm                                Diastology                                LV e' medial:    6.53 cm/s LV Volumes (MOD)               LV E/e' medial:  17.2 LV vol d, MOD    110.0 ml      LV e' lateral:   10.80 cm/s A2C:                           LV E/e' lateral: 10.4 LV vol d, MOD    164.0 ml A4C: LV vol s, MOD    36.5 ml A2C: LV vol s, MOD    52.9 ml A4C: LV SV MOD A2C:   73.5 ml LV SV MOD A4C:   164.0 ml LV SV MOD BP:    89.5 ml RIGHT VENTRICLE             IVC RV S prime:     14.70 cm/s  IVC diam: 2.30 cm TAPSE (M-mode): 2.7 cm LEFT ATRIUM              Index        RIGHT ATRIUM           Index LA diam:        5.50 cm  2.26 cm/m   RA Area:     21.20 cm LA Vol (A2C):   114.0 ml 46.92 ml/m  RA Volume:   59.40 ml  24.45 ml/m LA Vol (A4C):   104.0 ml 42.81 ml/m LA Biplane Vol: 112.0 ml 46.10 ml/m  AORTIC VALVE AV Area (Vmax):    1.81 cm AV Area (Vmean):   1.84 cm AV Area (VTI):     1.96 cm AV Vmax:           212.00 cm/s AV Vmean:          138.000 cm/s AV VTI:            0.461 m AV Peak Grad:      18.0 mmHg AV Mean Grad:      8.0 mmHg LVOT Vmax:         122.00 cm/s LVOT Vmean:        80.800 cm/s  LVOT VTI:  0.288 m LVOT/AV VTI ratio: 0.62  AORTA Ao Root diam: 3.30 cm Ao Asc diam:  3.20 cm MITRAL VALVE MV Area (PHT): 2.32 cm     SHUNTS MV Decel Time: 327 msec     Systemic VTI:  0.29 m MR Peak grad: 120.6 mmHg    Systemic Diam: 2.00 cm MR Mean grad: 82.0 mmHg MR Vmax:      549.00 cm/s MR Vmean:     420.0 cm/s MV E velocity: 112.00 cm/s MV Huber velocity: 48.00 cm/s MV E/Huber ratio:  2.33 Lyman Bishop MD Electronically signed by Lyman Bishop MD Signature Date/Time: 09/21/2022/5:02:02 PM    Final    CT CHEST ABDOMEN PELVIS W CONTRAST  Result Date: 09/18/2022 CLINICAL DATA:  Pneumonia, complication suspected. Sepsis and fever. EXAM: CT CHEST, ABDOMEN, AND PELVIS WITH CONTRAST TECHNIQUE: Multidetector CT imaging of the chest, abdomen and pelvis was performed following the standard protocol during bolus administration of intravenous contrast. RADIATION DOSE REDUCTION: This exam was performed according to the departmental dose-optimization program which includes automated exposure control, adjustment of the mA and/or kV according to patient size and/or use of iterative reconstruction technique. CONTRAST:  154m OMNIPAQUE IOHEXOL 300 MG/ML  SOLN COMPARISON:  02/05/2022. FINDINGS: CT CHEST FINDINGS Cardiovascular: The heart is enlarged and there is no pericardial effusion. The distal tip of Huber right chest port terminates in the right atrium. There is atherosclerotic calcification of the aorta without evidence of aneurysm. The pulmonary trunk is distended suggesting underlying pulmonary artery hypertension. Mediastinum/Nodes: No mediastinal, hilar, or axillary lymphadenopathy. The thyroid gland, trachea, and esophagus are within normal limits. Lungs/Pleura: Atelectasis is present bilaterally. No effusion or pneumothorax. Musculoskeletal: Shoulder arthroplasty changes are noted on the right. Degenerative changes are present in the thoracic spine. No acute or suspicious osseous abnormality. CT ABDOMEN PELVIS FINDINGS  Hepatobiliary: Cirrhotic changes are noted in the liver. No focal abnormality is seen. Huber tips stent appear stable in position. Huber large amount of stones are present within the gallbladder. No biliary ductal dilatation. Pancreas: Unremarkable. No pancreatic ductal dilatation or surrounding inflammatory changes. Spleen: The spleen is enlarged measuring 16.8 cm in length. No focal abnormality. Adrenals/Urinary Tract: The adrenal glands are within normal limits. No renal calculus or hydronephrosis. The bladder is unremarkable. Stomach/Bowel: There is Huber small hiatal hernia. Stomach is otherwise within normal limits. Appendix is not seen. No evidence of bowel wall thickening, distention, or inflammatory changes. No free air or pneumatosis. Vascular/Lymphatic: Aortic atherosclerosis. No enlarged abdominal or pelvic lymph nodes. Reproductive: Status post hysterectomy. No adnexal masses. Other: Moderate-to-large ascites. Anasarca is noted. There is an umbilical hernia containing ascites. Musculoskeletal: Degenerative changes are present in the lumbar spine. No acute osseous abnormality. IMPRESSION: 1. No acute process in the chest, abdomen, and pelvis. 2. Morphologic changes of cirrhosis and portal hypertension. 3. Moderate-to-large ascites. 4. Anasarca. 5. Cholelithiasis. 6. Aortic atherosclerosis. 7. Remaining incidental findings as described above. Electronically Signed   By: LBrett FairyM.D.   On: 09/18/2022 02:53   CT Head Wo Contrast  Result Date: 09/18/2022 CLINICAL DATA:  Fever, altered mental status EXAM: CT HEAD WITHOUT CONTRAST TECHNIQUE: Contiguous axial images were obtained from the base of the skull through the vertex without intravenous contrast. RADIATION DOSE REDUCTION: This exam was performed according to the departmental dose-optimization program which includes automated exposure control, adjustment of the mA and/or kV according to patient size and/or use of iterative reconstruction technique.  COMPARISON:  09/21/2017 FINDINGS: Brain: No evidence of acute infarction, hemorrhage, hydrocephalus, extra-axial  collection or mass lesion/mass effect. Mild subcortical white matter and periventricular small vessel ischemic changes. Vascular: Intracranial atherosclerosis. Skull: Normal. Negative for fracture or focal lesion. Sinuses/Orbits: The visualized paranasal sinuses are essentially clear. The mastoid air cells are unopacified. Other: None. IMPRESSION: No evidence of acute intracranial abnormality. Mild small vessel ischemic changes. Electronically Signed   By: Julian Hy M.D.   On: 09/18/2022 02:44   DG Chest Port 1 View  Result Date: 09/17/2022 CLINICAL DATA:  Possible sepsis EXAM: PORTABLE CHEST 1 VIEW COMPARISON:  02/08/2022 FINDINGS: Cardiac shadow is mildly enlarged but stable. Right chest wall port is now seen in satisfactory position. Lungs are hypoinflated with crowding of the vascular markings. No focal infiltrate is seen. No bony abnormality is noted. IMPRESSION: No acute abnormality noted. Electronically Signed   By: Inez Catalina M.D.   On: 09/17/2022 21:46    Labs:  CBC: Recent Labs    10/09/22 1121 10/15/22 0759 10/16/22 0430 10/17/22 0429  WBC 3.1* 3.2* 2.6* 2.8*  HGB 6.9* 8.8* 7.3* 7.0*  HCT 22.0* 28.3* 23.4* 22.4*  PLT 88* 82* 68* 62*    COAGS: Recent Labs    03/22/22 0206 06/20/22 0626 09/05/22 1108 09/17/22 2111 09/18/22 1253 09/19/22 0359  INR 1.3* 1.3* 1.3* 1.4* 1.4*  --   APTT 30  --   --  34 35 36    BMP: Recent Labs    09/24/22 0500 10/15/22 0936 10/16/22 0430 10/17/22 0430  NA 136 136 137 138  K 3.3* 4.3 4.0 3.9  CL 107 109 110 112*  CO2 22 19* 19* 18*  GLUCOSE 136* 114* 109* 90  BUN 31* 69* 67* 70*  CALCIUM 7.7* 8.0* 7.8* 7.8*  CREATININE 1.41* 4.31* 4.09* 3.74*  GFRNONAA 42* 11* 12* 13*    LIVER FUNCTION TESTS: Recent Labs    09/19/22 0359 09/20/22 0508 09/21/22 0455 10/16/22 0430 10/17/22 0429 10/17/22 0430  BILITOT  1.8* 1.8* 1.7*  --  1.5*  --   AST 48* 44* 41  --  47*  --   ALT '20 21 22  '$ --  8  --   ALKPHOS 76 83 89  --  79  --   PROT 4.3* 4.2* 4.4*  --  4.8*  --   ALBUMIN 2.2* 2.1* 2.1* 2.3* 2.8* 2.9*    TUMOR MARKERS: No results for input(s): "AFPTM", "CEA", "CA199", "CHROMGRNA" in the last 8760 hours.  Assessment and Plan:  67 yo female with PMHx of CKD, DM II, NASH cirrhosis, IDA, thalassemia minor, GERD and OSA oc CPAP presents to IR for tunneled catheter with port placement.    Risks and benefits of image guided tunneled catheter placement with port with moderate sedation was discussed with the patient including, but not limited to bleeding, infection, pneumothorax, or fibrin sheath development and need for additional procedures.  All of the patient's questions were answered, patient is agreeable to proceed. Consent signed and in chart.  Thank you for this interesting consult.  I greatly enjoyed meeting LAQUASHIA MCANULTY and look forward to participating in their care.  Huber copy of this report was sent to the requesting provider on this date.  Electronically Signed: Tyson Alias, NP 10/17/2022, 3:32 PM   I spent Huber total of {New ZM:[id number] {New Out-Pt:[id number]}  {Established Out-Pt:[id number]} in face to face in clinical consultation, greater than 50% of which was counseling/coordinating care for poor peripheral access.

## 2022-10-17 NOTE — TOC Initial Note (Signed)
Transition of Care [facility]) - Initial/Assessment Note    Patient Details  Name: Rachel Huber MRN: [id number] Date of Birth: 07/19/1956  [facility]) CM/SW Contact:    Salome Arnt, Clifford Phone Number: 10/17/2022, 9:19 AM  Clinical Narrative:  Pt admitted due to acute kidney injury. Assessment completed with pt due to high risk readmission score. Pt reports she lives with her husband who assists her with bathing and dressing. Pt ambulates with a walker. Her husband drives her to appointments. Pt recently completed IV antibiotics at home. Pt plans to return home when medically stable. TOC will continue to follow.               Expected Discharge Plan: Home/Self Care Barriers to Discharge: Continued Medical Work up   Patient Goals and CMS Choice Patient states their goals for this hospitalization and ongoing recovery are:: return home   Choice offered to / list presented to : Patient [place] ownership interest in [facility].provided to::  (n/a)    Expected Discharge Plan and Services In-house Referral: Clinical Social Work     Living arrangements for the past 2 months: Single Family Home                                      Prior Living Arrangements/Services Living arrangements for the past 2 months: Single Family Home Lives with:: Spouse Patient language and need for interpreter reviewed:: Yes Do you feel safe going back to the place where you live?: Yes      Need for Family Participation in Patient Care: Yes (Comment) Care giver support system in place?: Yes (comment) Current home services: DME (cane, walker, shower chair) Criminal Activity/Legal Involvement Pertinent to Current Situation/Hospitalization: No - Comment as needed  Activities of Daily Living Home Assistive Devices/Equipment: Cane (specify quad or straight), Walker (specify type) ADL Screening (condition at time of admission) Patient's cognitive ability adequate to  safely complete daily activities?: No Is the patient deaf or have difficulty hearing?: No Does the patient have difficulty seeing, even when wearing glasses/contacts?: No Does the patient have difficulty concentrating, remembering, or making decisions?: No Patient able to express need for assistance with ADLs?: Yes Does the patient have difficulty dressing or bathing?: No Independently performs ADLs?: Yes (appropriate for developmental age) Does the patient have difficulty walking or climbing stairs?: Yes Weakness of Legs: Both Weakness of Arms/Hands: None  Permission Sought/Granted                  Emotional Assessment     Affect (typically observed): Appropriate Orientation: : Oriented to Self, Oriented to Place, Oriented to  Time, Oriented to Situation Alcohol / Substance Use: Not Applicable Psych Involvement: No (comment)  Admission diagnosis:  AKI (acute kidney injury) ([place]) [N17.9] Acute renal failure (ARF) ([place]) [N17.9] Patient Active Problem List   Diagnosis Date Noted   Acute renal failure (ARF) ([place]) 10/16/2022   AKI (acute kidney injury) ([place]) 10/15/2022   Bacteremia [id number]   Alcoholic cirrhosis of liver with ascites ([place]) 09/21/2022   SIRS (systemic inflammatory response syndrome) ([place]) 09/18/2022   Sepsis ([place]) 09/18/2022   Rectal bleeding 06/17/2022   Obesity (BMI 30-39.9) 06/17/2022   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 mellitus ([place]) 06/17/2022   Grade IV internal hemorrhoids 05/16/2022   Acute on chronic blood loss anemia 03/05/2022   Thrombocytopenia ([place]) 03/05/2022  Fever 02/08/2022   Anemia in chronic kidney disease (CKD) 02/07/2022   GI bleeding 02/04/2022   Chronic kidney disease, stage 3b ([place]) 02/04/2022   Depression with anxiety 02/04/2022   Liver cirrhosis secondary to NASH ([place])    Insomnia 01/27/2022   Iron deficiency anemia due to chronic blood loss 02/13/2021   History of uterine cancer 02/13/2021   Decompensated hepatic cirrhosis  ([place]) 10/25/2020   OSA on CPAP 10/25/2020   Glossitis 10/25/2020   Hyponatremia 10/08/2020   Acute respiratory failure with hypoxia ([place]) 09/10/2020   Pneumonia due to COVID-19 virus 09/08/2020   Hip fracture, right ([place]) 09/04/2020   Closed right hip fracture ([place]) 09/02/2020   Type 2 diabetes mellitus with hyperlipidemia ([place]) 09/02/2020   Thalassemia minor    HLD (hyperlipidemia)    Acute kidney injury superimposed on CKD ([place])    Depression    Nondisplaced fracture of greater trochanter of right femur, initial encounter for closed fracture ([place])    PCP:  [name], MD Pharmacy:   [facility] [address] [place] [address] [place] [address] Phone: [phone] Fax: [phone]     Social Determinants of Health (SDOH) Social History: SDOH Screenings   Food Insecurity: No Food Insecurity (10/15/2022)  Housing: Low Risk  (10/15/2022)  Transportation Needs: No Transportation Needs (10/15/2022)  Utilities: Not At Risk (10/15/2022)  Alcohol Screen: Low Risk  (04/04/2022)  Depression (PHQ2-9): Low Risk  (04/04/2022)  Financial Resource Strain: Low Risk  (04/04/2022)  Physical Activity: Inactive (04/04/2022)  Social Connections: Moderately Isolated (04/04/2022)  Stress: No Stress Concern Present (04/04/2022)  Tobacco Use: Low Risk  (10/15/2022)   SDOH Interventions:     Readmission Risk Interventions    10/17/2022    9:17 AM 09/18/2022    2:19 PM 03/26/2022    2:40 PM  Readmission Risk Prevention Plan  Transportation Screening Complete Complete Complete  Medication Review Press photographer) Complete Complete Complete  PCP or Specialist appointment within 3-5 days of discharge  Complete Complete  HRI or Home Care Consult Complete Complete Complete  SW Recovery Care/Counseling Consult Complete Complete Complete  Palliative Care Screening Not Applicable Not Applicable Not [place] Not Applicable Not Applicable  Not Applicable

## 2022-10-17 NOTE — Progress Notes (Signed)
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  She had a PICC line placed and this has subsequently been removed since completion of IV antibiotics.  Patient is status post TIPS at [facility] on 08/20/2022.  And she is status post embolization of hemorrhoids 09/05/2022.  Patient is followed closely by gastroenterology and hepatology liver transplant at [facility].  She reportedly had last transfusion 10/09/2022.  She has chronic swelling in the legs and had been on diuretics but she was recently taken off all diuretics around 09/24/2022.  Around that time she was noted to have a small bump in creatinine.  She has had subsequent blood work done that shows that her creatinine is continue to climb and now up to greater than 4.  She does not seem to have symptoms for this now.  Her hematology clinic informed her about the abnormal labs and requested that she come into the hospital for further management.   Assessment and Plan:  AKI on CKD stage IIIb -Continue to hold all diuretics at this time -Patient appears to be intravascularly volume depleted initially -Gently hydrate with IV fluid overnight, now off all IV fluid -Renally dose all medications as appropriate, avoiding nephrotoxic agents -ED consulted nephrology who  is following -nephrology team starting IV albumin infusions with some improvement in creatinine noted   Chronic anemia -Secondary to thalassemia disease, managed by hematologist -Secondary to chronic kidney disease   Thrombocytopenia -Secondary chronic liver disease -No active bleeding complications at this time -Follow CBC daily -platelets trending down, recheck in AM    OSA on CPAP -Will offer nightly CPAP while in hospital   Liver cirrhosis secondary to NASH -Patient is followed closely by [facility] hepatology -Patient planning to have teeth removed as a final requirement for liver transplant consideration -Status post recent paracentesis 10/14/2022 where 8 L of fluid removed -IV albumin per nephrology -ammonia level elevated, starting lactulose treatments     DVT prophylaxis: TED hoses Code Status: Full  Family Communication:  Disposition: Status is: Inpatient Remains inpatient appropriate because: IV treatments    Consultants:  Nephrology  Procedures:   Antimicrobials:    Subjective: Pt denies CP and SOB.  Pt reports no abdominal pain.    Objective: Vitals:   10/16/22 2051 10/17/22 0315 10/17/22 1027 10/17/22 1300  BP: (!) 124/50 (!) 115/50 (!) 125/50 (!) 127/55  Pulse: 76 79 81 76  Resp: '18 20 18 18  '$ Temp: 98.7 F (37.1 C) 98.4 F (36.9 C) 98.4 F (36.9 C) 98.2 F (36.8 C)  TempSrc: Oral Oral Oral Oral  SpO2: 96% 94% 94% 96%  Weight:  128 kg    Height:        Intake/Output Summary (Last 24 hours) at 10/17/2022 1628 Last data filed at 10/17/2022 1300 Gross per 24 hour  Intake 714.23 ml  Output --  Net 714.23 ml   [employer]  10/15/22 1117 10/16/22 0350 10/17/22 0315  Weight: 127 kg 130.3 kg 128 kg   Examination:  General exam: Appears calm and comfortable  Respiratory system: Clear to auscultation. Respiratory effort normal. Cardiovascular system: normal S1 & S2 heard. No JVD, murmurs, rubs, gallops or clicks. No pedal edema. Gastrointestinal  system: Abdomen is nondistended, soft and nontender. No organomegaly or masses felt. Normal bowel sounds heard. Central nervous system: Alert and oriented. No focal neurological deficits. Extremities: Symmetric 5 x 5 power. Skin: No rashes, lesions or ulcers. Psychiatry: Judgement and insight appear normal. Mood & affect appropriate.   Data Reviewed: I have personally reviewed following labs and imaging studies  CBC: Recent Labs  Lab 10/15/22 0759 10/16/22 0430 10/17/22 0429  WBC 3.2* 2.6* 2.8*  NEUTROABS 2.0  --   --   HGB 8.8* 7.3* 7.0*  HCT 28.3* 23.4* 22.4*  MCV 81.8 80.7 81.2  PLT 82* 68* 62*    Basic Metabolic Panel: Recent Labs  Lab 10/15/22 0759 10/15/22 0936 10/16/22 0430 10/17/22 0429 10/17/22 0430  NA  --  136 137  --  138  K  --  4.3 4.0  --  3.9  CL  --  109 110  --  112*  CO2  --  19* 19*  --  18*  GLUCOSE  --  114* 109*  --  90  BUN  --  69* 67*  --  70*  CREATININE  --  4.31* 4.09*  --  3.74*  CALCIUM  --  8.0* 7.8*  --  7.8*  MG 2.1  --  2.0 2.0  --   PHOS 6.8*  --  6.6*  --  6.1*    CBG: No results for input(s): "GLUCAP" in the last 168 hours.  No results found for this or any previous visit (from the past 240 hour(s)).   Radiology Studies: US RENAL  Result Date: 10/16/2022 CLINICAL DATA:  Impaired renal function EXAM: RENAL / URINARY TRACT ULTRASOUND COMPLETE COMPARISON:  None Available. FINDINGS: Right Kidney: Length: 13.0 cm. Echogenicity within normal limits. No mass or hydronephrosis visualized. Left Kidney: Length: 11.4 cm. Echogenicity within normal limits. No mass or hydronephrosis visualized. Bladder: Appears normal for degree of bladder distention. Other: Note is made of large ascites in the lower abdomen. IMPRESSION: Unremarkable examination of the kidneys. Large ascites noted incidentally. Electronically Signed   By: Sammie Bench M.D.   On: 10/16/2022 10:23    Scheduled Meds:  atorvastatin  10 mg Oral QPM   ciprofloxacin  500 mg  Oral Q breakfast   lactulose  20 g Oral BID   midodrine  10 mg Oral TID WC   polyethylene glycol  17 g Oral BID   sertraline  50 mg Oral Daily   Continuous Infusions:  albumin human 25 g (10/17/22 1549)    LOS: 1 day   Time spent: 35 mins  Mychelle Kendra Wynetta Emery, MD How to contact the [facility] Attending or Consulting provider [place] or covering provider during after hours [place], for this patient?  Check the care team in [facility] and look for a) attending/consulting TRH provider listed and b) the [facility] team listed Log into [email or web address] and use [place]'s universal password to access. If you do not have the password, please contact the hospital operator. Locate the [facility] provider you are looking for under [facility] and page to a number that you can be directly reached. If you still have difficulty reaching the provider, please page the  DOC (Director on Call) for the Hospitalists listed on amion for assistance.  10/17/2022, 4:28 PM

## 2022-10-17 NOTE — Progress Notes (Signed)
Subjective:  UOP not well recorded-  weight supposedly down 2.3 pounds-  crt down a little -  BP looking good-  renal ultrasound normal-  urine pretty bland-   30 protein, no rbc-  she has no new c/o's  Objective Vital signs in last 24 hours: Vitals:   10/16/22 1355 10/16/22 1633 10/16/22 2051 10/17/22 0315  BP: 117/80 (!) 127/53 (!) 124/50 (!) 115/50  Pulse:  73 76 79  Resp: '20 20 18 20  '$ Temp: 97.8 F (36.6 C) 97.7 F (36.5 C) 98.7 F (37.1 C) 98.4 F (36.9 C)  TempSrc: Axillary Axillary Oral Oral  SpO2: 98% 97% 96% 94%  Weight:    128 kg  Height:       Weight change: 0.993 kg  Intake/Output Summary (Last 24 hours) at 10/17/2022 0734 Last data filed at 10/17/2022 0600 Gross per 24 hour  Intake 594.23 ml  Output --  Net 594.23 ml    Assessment/Plan: 67 year old WF with cirrhosis req freq paracentesis-  now presenting with subacute A on CRF since 2/7 in the setting of paracentesis/diuresis and soft BP 1.Renal- crt 1.4 on 2/7-   3.3 on 2/20 and 4.3 on 2/28.  Has had at least 2 large volume paracenteses over this time period as well as being on lasix, large dose neurontin and had lowish BP.  This has all the hallmarks of being a hemodynamic related kidney injury-  on the continuum of HRS.  U/A and renal ultrasound essentially normal.  For now I agree with holding high dose neurontin and lasix.  Have added back midodrine and doing albumin supplementation for 48 hours.  Because crt has trended better already I will not add octreotide at this time.  She does not need fluid supplementation.  No change in plan today  2. Hypertension/volume  - volume overloaded and hypotensive-  will try midodrine and albumin to imrpove renal perfusion 3. Anemia  - anemic but actually better now than has been after transfusion.  I see that pt is resistant to further retacrit as she felt it caused her issue last hospitalization.  I did not push   Pt will not be physically seen over the weekend-  labs will be  watched to show hopefully continued improvement in renal function -  will re visit on Monday  Sencere Symonette A Jerimyah Vandunk    Labs: Basic Metabolic Panel: Recent Labs  Lab 10/15/22 0759 10/15/22 0936 10/16/22 0430 10/17/22 0430  NA  --  136 137 138  K  --  4.3 4.0 3.9  CL  --  109 110 112*  CO2  --  19* 19* 18*  GLUCOSE  --  114* 109* 90  BUN  --  69* 67* 70*  CREATININE  --  4.31* 4.09* 3.74*  CALCIUM  --  8.0* 7.8* 7.8*  PHOS 6.8*  --  6.6* 6.1*   Liver Function Tests: Recent Labs  Lab 10/16/22 0430 10/17/22 0429 10/17/22 0430  AST  --  47*  --   ALT  --  8  --   ALKPHOS  --  79  --   BILITOT  --  1.5*  --   PROT  --  4.8*  --   ALBUMIN 2.3* 2.8* 2.9*   No results for input(s): "LIPASE", "AMYLASE" in the last 168 hours. Recent Labs  Lab 10/17/22 0429  AMMONIA 95*   CBC: Recent Labs  Lab 10/15/22 0759 10/16/22 0430 10/17/22 0429  WBC 3.2* 2.6* 2.8*  NEUTROABS 2.0  --   --   HGB 8.8* 7.3* 7.0*  HCT 28.3* 23.4* 22.4*  MCV 81.8 80.7 81.2  PLT 82* 68* 62*   Cardiac Enzymes: No results for input(s): "CKTOTAL", "CKMB", "CKMBINDEX", "TROPONINI" in the last 168 hours. CBG: No results for input(s): "GLUCAP" in the last 168 hours.  Iron Studies: No results for input(s): "IRON", "TIBC", "TRANSFERRIN", "FERRITIN" in the last 72 hours. Studies/Results: US RENAL  Result Date: 10/16/2022 CLINICAL DATA:  Impaired renal function EXAM: RENAL / URINARY TRACT ULTRASOUND COMPLETE COMPARISON:  None Available. FINDINGS: Right Kidney: Length: 13.0 cm. Echogenicity within normal limits. No mass or hydronephrosis visualized. Left Kidney: Length: 11.4 cm. Echogenicity within normal limits. No mass or hydronephrosis visualized. Bladder: Appears normal for degree of bladder distention. Other: Note is made of large ascites in the lower abdomen. IMPRESSION: Unremarkable examination of the kidneys. Large ascites noted incidentally. Electronically Signed   By: Sammie Bench M.D.   On:  10/16/2022 10:23   Medications: Infusions:  albumin human 25 g (10/17/22 0407)    Scheduled Medications:  atorvastatin  10 mg Oral QPM   ciprofloxacin  500 mg Oral Q breakfast   lactulose  20 g Oral BID   midodrine  10 mg Oral TID WC   polyethylene glycol  17 g Oral BID   sertraline  50 mg Oral Daily    have reviewed scheduled and prn medications.  Physical Exam: General:  obese, just woke up Heart: RRR Lungs: dec BS at bases Abdomen: obese, distended as well  Extremities: pitting edema     10/17/2022,7:34 AM  LOS: 1 day

## 2022-10-17 NOTE — Progress Notes (Signed)
IV restarted via ultrasound by Jacqulynn Cadet S.,RN, patient tolerated well.

## 2022-10-18 DIAGNOSIS — N1832 Chronic kidney disease, stage 3b: Secondary | ICD-10-CM | POA: Diagnosis not present

## 2022-10-18 DIAGNOSIS — N179 Acute kidney failure, unspecified: Secondary | ICD-10-CM | POA: Diagnosis not present

## 2022-10-18 DIAGNOSIS — D696 Thrombocytopenia, unspecified: Secondary | ICD-10-CM | POA: Diagnosis not present

## 2022-10-18 DIAGNOSIS — G4733 Obstructive sleep apnea (adult) (pediatric): Secondary | ICD-10-CM | POA: Diagnosis not present

## 2022-10-18 DIAGNOSIS — N189 Chronic kidney disease, unspecified: Secondary | ICD-10-CM

## 2022-10-18 LAB — RENAL FUNCTION PANEL
Albumin: 3.4 g/dL — ABNORMAL LOW (ref 3.5–5.0)
Anion gap: 11 (ref 5–15)
BUN: 67 mg/dL — ABNORMAL HIGH (ref 8–23)
CO2: 19 mmol/L — ABNORMAL LOW (ref 22–32)
Calcium: 8.6 mg/dL — ABNORMAL LOW (ref 8.9–10.3)
Chloride: 110 mmol/L (ref 98–111)
Creatinine, Ser: 3.61 mg/dL — ABNORMAL HIGH (ref 0.44–1.00)
GFR, Estimated: 13 mL/min — ABNORMAL LOW (ref >60)
Glucose, Bld: 92 mg/dL (ref 70–99)
Phosphorus: 6 mg/dL — ABNORMAL HIGH (ref 2.5–4.6)
Potassium: 4.1 mmol/L (ref 3.5–5.1)
Sodium: 140 mmol/L (ref 135–145)

## 2022-10-18 LAB — CBC
HCT: 22.3 % — ABNORMAL LOW (ref 36.0–46.0)
Hemoglobin: 7 g/dL — ABNORMAL LOW (ref 12.0–15.0)
MCH: 25.5 pg — ABNORMAL LOW (ref 26.0–34.0)
MCHC: 31.4 g/dL (ref 30.0–36.0)
MCV: 81.1 fL (ref 80.0–100.0)
Platelets: 60 10*3/uL — ABNORMAL LOW (ref 150–400)
RBC: 2.75 MIL/uL — ABNORMAL LOW (ref 3.87–5.11)
RDW: 22.5 % — ABNORMAL HIGH (ref 11.5–15.5)
WBC: 2.9 10*3/uL — ABNORMAL LOW (ref 4.0–10.5)
nRBC: 0 % (ref 0.0–0.2)

## 2022-10-18 LAB — AMMONIA: Ammonia: 72 umol/L — ABNORMAL HIGH (ref 9–35)

## 2022-10-18 NOTE — Plan of Care (Signed)
  Problem: Education: Goal: Knowledge of General Education information will improve Description Including pain rating scale, medication(s)/side effects and non-pharmacologic comfort measures Outcome: Progressing   Problem: Health Behavior/Discharge Planning: Goal: Ability to manage health-related needs will improve Outcome: Progressing   

## 2022-10-18 NOTE — Progress Notes (Signed)
Patient is resting in her bed a this time. Patient had no complaints of pain. Patient ambulated the bathroom x1 with 1 assist during this shift. Plan of care ongoing.

## 2022-10-18 NOTE — Progress Notes (Signed)
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  She had a PICC line placed and this has subsequently been removed since completion of IV antibiotics.  Patient is status post TIPS at [facility] on 08/20/2022.  And she is status post embolization of hemorrhoids 09/05/2022.  Patient is followed closely by gastroenterology and hepatology liver transplant at [facility].  She reportedly had last transfusion 10/09/2022.  She has chronic swelling in the legs and had been on diuretics but she was recently taken off all diuretics around 09/24/2022.  Around that time she was noted to have a small bump in creatinine.  She has had subsequent blood work done that shows that her creatinine is continue to climb and now up to greater than 4.  She does not seem to have symptoms for this now.  Her hematology clinic informed her about the abnormal labs and requested that she come into the hospital for further management.   Assessment and Plan:  AKI on CKD stage IIIb -Continue to hold all diuretics at this time -Patient appears to be intravascularly volume depleted initially -Gently hydrate with IV fluid initially, now off all IV fluid -Renally dose all medications as appropriate, avoiding nephrotoxic agents -ED consulted nephrology who  is following -nephrology team starting IV albumin infusions with some improvement in creatinine noted -creatinine improved more today to 3.61   Chronic anemia -Secondary to thalassemia disease, managed by hematologist -Secondary to chronic kidney disease   Thrombocytopenia -Secondary chronic liver disease -No active bleeding complications at this time -Follow CBC daily -platelets trended down, but no active bleeding at this time    OSA on CPAP -Will offer nightly CPAP while in hospital   Liver cirrhosis secondary to NASH -Patient is followed closely by [facility] hepatology -Patient planning to have teeth removed as a final requirement for liver transplant consideration -Status post recent paracentesis 10/14/2022 where 8 L of fluid removed -IV albumin per nephrology -ammonia level elevated, ordered lactulose treatments but pt refuses to take in hospital  -fortunately had a large BM this morning and ammonia level trending down and mentation is good, did not order a repeat ammonia unless there is a change in mentation     DVT prophylaxis: TED hoses Code Status: Full  Family Communication:  Disposition: anticipate home on Mon 3/4 if ok with nephrology team Remains inpatient appropriate because: IV treatments    Consultants:  Nephrology  Procedures:   Antimicrobials:    Subjective: Pt REFUSING to take lactulose in hospital despite me telling her the ammonia level was elevated. Pt says she will only take it at home due to frequent BMs.  Pt says she had a large BM this morning.      Objective: Vitals:   10/17/22 1300 10/17/22 2155 10/18/22 0451 10/18/22 0547  BP: (!) 127/55 (!) 121/45 (!) 127/51   Pulse: 76 70 71   Resp: 18 16  16   Temp: 98.2 F (36.8 C) 98.6 F (37 C) 98.8 F (37.1 C)   TempSrc: Oral Oral Oral   SpO2: 96% 95% 94%   Weight:    130.8 kg  Height:        Intake/Output Summary (Last 24 hours) at 10/18/2022 1131 Last data filed at 10/18/2022 0947 Gross per 24  hour  Intake 600 ml  Output --  Net 600 ml   Filed Weights   10/16/22 0350 10/17/22 0315 10/18/22 0547  Weight: 130.3 kg 128 kg 130.8 kg   Examination:  General exam: Appears calm and comfortable  Respiratory system: Clear to auscultation. Respiratory effort normal. Cardiovascular system: normal S1 & S2 heard. No JVD, murmurs, rubs, gallops or clicks. No pedal edema. Gastrointestinal system: Abdomen is nondistended, soft and nontender. No organomegaly or masses felt. Normal bowel sounds heard. Central nervous system: Alert and oriented. No focal neurological deficits. Extremities: Symmetric 5 x 5 power. Skin: No rashes, lesions or ulcers. Psychiatry: Judgement and insight appear normal. Mood & affect appropriate.   Data Reviewed: I have personally reviewed following labs and imaging studies  CBC: Recent Labs  Lab 10/15/22 0759 10/16/22 0430 10/17/22 0429 10/18/22 0436  WBC 3.2* 2.6* 2.8* 2.9*  NEUTROABS 2.0  --   --   --   HGB 8.8* 7.3* 7.0* 7.0*  HCT 28.3* 23.4* 22.4* 22.3*  MCV 81.8 80.7 81.2 81.1  PLT 82* 68* 62* 60*    Basic Metabolic Panel: Recent Labs  Lab 10/15/22 0759 10/15/22 0936 10/16/22 0430 10/17/22 0429 10/17/22 0430 10/18/22 0436  NA  --  136 137  --  138 140  K  --  4.3 4.0  --  3.9 4.1  CL  --  109 110  --  112* 110  CO2  --  19* 19*  --  18* 19*  GLUCOSE  --  114* 109*  --  90 92  BUN  --  69* 67*  --  70* 67*  CREATININE  --  4.31* 4.09*  --  3.74* 3.61*  CALCIUM  --  8.0* 7.8*  --  7.8* 8.6*  MG 2.1  --  2.0 2.0  --   --   PHOS 6.8*  --  6.6*  --  6.1* 6.0*    CBG: No results for input(s): "GLUCAP" in the last 168 hours.  No results found for this or any previous visit (from the past 240 hour(s)).   Radiology Studies: No results found.  Scheduled Meds:  atorvastatin  10 mg Oral QPM   ciprofloxacin  500 mg Oral Q breakfast   lactulose  20 g Oral BID   midodrine  10 mg Oral TID WC   polyethylene glycol  17 g Oral BID    sertraline  50 mg Oral Daily   Continuous Infusions:   LOS: 2 days   Time spent: 35 mins  Makinze Jani Wynetta Emery, MD How to contact the [facility] Attending or Consulting provider [place] or covering provider during after hours [place], for this patient?  Check the care team in [facility] and look for a) attending/consulting TRH provider listed and b) the [facility] team listed Log into [email or web address] and use [place]'s universal password to access. If you do not have the password, please contact the hospital operator. Locate the [facility] provider you are looking for under [facility] and page to a number that you can be directly reached. If you still have difficulty reaching the provider, please  page the [facility] (Director on Call) for the Hospitalists listed on amion for assistance.  10/18/2022, 11:31 AM

## 2022-10-18 NOTE — Progress Notes (Signed)
Chart/labs reviewed. Cr continues to improved slowly, down to 3.6 today. Would c/w midodrine '10mg'$  TID in the interim. Finished albumin x 8 doses. If Cr worsens, then would consider adding octreotide. Please call on-call nephrologist with any questions/concerns.

## 2022-10-18 NOTE — Progress Notes (Signed)
Patient has been refusing CPAP at night, there is no machine in room.

## 2022-10-19 DIAGNOSIS — G4733 Obstructive sleep apnea (adult) (pediatric): Secondary | ICD-10-CM | POA: Diagnosis not present

## 2022-10-19 DIAGNOSIS — D696 Thrombocytopenia, unspecified: Secondary | ICD-10-CM | POA: Diagnosis not present

## 2022-10-19 DIAGNOSIS — N1832 Chronic kidney disease, stage 3b: Secondary | ICD-10-CM | POA: Diagnosis not present

## 2022-10-19 DIAGNOSIS — N179 Acute kidney failure, unspecified: Secondary | ICD-10-CM | POA: Diagnosis not present

## 2022-10-19 LAB — RENAL FUNCTION PANEL
Albumin: 3.2 g/dL — ABNORMAL LOW (ref 3.5–5.0)
Anion gap: 9 (ref 5–15)
BUN: 69 mg/dL — ABNORMAL HIGH (ref 8–23)
CO2: 20 mmol/L — ABNORMAL LOW (ref 22–32)
Calcium: 8.3 mg/dL — ABNORMAL LOW (ref 8.9–10.3)
Chloride: 109 mmol/L (ref 98–111)
Creatinine, Ser: 3.4 mg/dL — ABNORMAL HIGH (ref 0.44–1.00)
GFR, Estimated: 14 mL/min — ABNORMAL LOW (ref >60)
Glucose, Bld: 140 mg/dL — ABNORMAL HIGH (ref 70–99)
Phosphorus: 5.1 mg/dL — ABNORMAL HIGH (ref 2.5–4.6)
Potassium: 3.8 mmol/L (ref 3.5–5.1)
Sodium: 138 mmol/L (ref 135–145)

## 2022-10-19 MED ORDER — NYSTATIN 100000 UNIT/GM EX POWD
Freq: Two times a day (BID) | CUTANEOUS | Status: DC
  Filled 2022-10-19: qty 15
  Filled 2022-10-19: qty 30

## 2022-10-19 NOTE — Progress Notes (Signed)
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  She had a PICC line placed and this has subsequently been removed since completion of IV antibiotics.  Patient is status post TIPS at [facility] on 08/20/2022.  And she is status post embolization of hemorrhoids 09/05/2022.  Patient is followed closely by gastroenterology and hepatology liver transplant at [facility].  She reportedly had last transfusion 10/09/2022.  She has chronic swelling in the legs and had been on diuretics but she was recently taken off all diuretics around 09/24/2022.  Around that time she was noted to have a small bump in creatinine.  She has had subsequent blood work done that shows that her creatinine is continue to climb and now up to greater than 4.  She does not seem to have symptoms for this now.  Her hematology clinic informed her about the abnormal labs and requested that she come into the hospital for further management.   Assessment and Plan:  AKI on CKD stage IIIb -Continue to hold all diuretics at this time -Patient appears to be intravascularly volume depleted initially -Gently hydrate with IV fluid initially, now off all IV fluid -Renally dose all medications as appropriate, avoiding nephrotoxic agents -ED consulted nephrology who  is following -nephrology team starting IV albumin infusions with some improvement in creatinine noted -creatinine improved more today to 3.40   Chronic anemia -Secondary to thalassemia disease, managed by hematologist -Secondary to chronic kidney disease   Thrombocytopenia -Secondary chronic liver disease -No active bleeding complications at this time -Follow CBC daily -platelets trended down, but no active bleeding at this time    OSA on CPAP -offer nightly CPAP while in hospital   Liver cirrhosis secondary to NASH -Patient is followed closely by [facility] hepatology -Patient planning to have teeth removed as a final requirement for liver transplant consideration -Status post recent paracentesis 10/14/2022 where 8 L of fluid removed -IV albumin per nephrology -ammonia level elevated, ordered lactulose treatments but pt refuses to take in hospital  -fortunately had a large BM and ammonia level trending down and mentation is good, did not order a repeat ammonia unless there is a change in mentation     DVT prophylaxis: TED hoses Code Status: Full  Family Communication:  Disposition: anticipate home on Mon 3/4 if ok with nephrology team Remains inpatient appropriate because: IV treatments    Consultants:  Nephrology  Procedures:   Antimicrobials:    Subjective: Pt without any specific complaints this morning     Objective: Vitals:   10/18/22 0547 10/18/22 2054 10/19/22 0423 10/19/22 0500  BP:  (!) 130/52 (!) 117/47   Pulse:  70 73   Resp:  16 14   Temp:  99.1 F (37.3 C) 98.6 F (37 C)   TempSrc:  Oral Oral   SpO2:  98% 97%   Weight: 130.8 kg   129 kg  Height:  Intake/Output Summary (Last 24 hours) at 10/19/2022 1040 Last data filed at 10/19/2022 0500 Gross per 24 hour  Intake 720 ml  Output 100 ml  Net 620 ml   Filed Weights   10/17/22 0315 10/18/22 0547 10/19/22 0500  Weight: 128 kg 130.8 kg 129 kg   Examination:  General exam: Appears calm and  comfortable  Respiratory system: Clear to auscultation. Respiratory effort normal. Cardiovascular system: normal S1 & S2 heard. No JVD, murmurs, rubs, gallops or clicks. No pedal edema. Gastrointestinal system: Abdomen is nondistended, soft and nontender. No organomegaly or masses felt. Normal bowel sounds heard. Central nervous system: Alert and oriented. No focal neurological deficits. Extremities: 2+ edema BLE Symmetric 5 x 5 power. Skin: No rashes, lesions or ulcers. Psychiatry: Judgement and insight appear normal. Mood & affect appropriate.   Data Reviewed: I have personally reviewed following labs and imaging studies  CBC: Recent Labs  Lab 10/15/22 0759 10/16/22 0430 10/17/22 0429 10/18/22 0436  WBC 3.2* 2.6* 2.8* 2.9*  NEUTROABS 2.0  --   --   --   HGB 8.8* 7.3* 7.0* 7.0*  HCT 28.3* 23.4* 22.4* 22.3*  MCV 81.8 80.7 81.2 81.1  PLT 82* 68* 62* 60*    Basic Metabolic Panel: Recent Labs  Lab 10/15/22 0759 10/15/22 0936 10/16/22 0430 10/17/22 0429 10/17/22 0430 10/18/22 0436 10/19/22 0504  NA  --  136 137  --  138 140 138  K  --  4.3 4.0  --  3.9 4.1 3.8  CL  --  109 110  --  112* 110 109  CO2  --  19* 19*  --  18* 19* 20*  GLUCOSE  --  114* 109*  --  90 92 140*  BUN  --  69* 67*  --  70* 67* 69*  CREATININE  --  4.31* 4.09*  --  3.74* 3.61* 3.40*  CALCIUM  --  8.0* 7.8*  --  7.8* 8.6* 8.3*  MG 2.1  --  2.0 2.0  --   --   --   PHOS 6.8*  --  6.6*  --  6.1* 6.0* 5.1*    CBG: No results for input(s): "GLUCAP" in the last 168 hours.  No results found for this or any previous visit (from the past 240 hour(s)).   Radiology Studies: No results found.  Scheduled Meds:  atorvastatin  10 mg Oral QPM   ciprofloxacin  500 mg Oral Q breakfast   lactulose  20 g Oral BID   midodrine  10 mg Oral TID WC   polyethylene glycol  17 g Oral BID   sertraline  50 mg Oral Daily   Continuous Infusions:   LOS: 3 days   Time spent: 35 mins  Rydan Gulyas Wynetta Emery, MD How to  contact the [facility] Attending or Consulting provider [place] or covering provider during after hours [place], for this patient?  Check the care team in [facility] and look for a) attending/consulting TRH provider listed and b) the [facility] team listed Log into [email or web address] and use [place]'s universal password to access. If you do not have the password, please contact the hospital operator. Locate the [facility] provider you are looking for under [facility] and page to a number that you can be directly reached. If you still have difficulty reaching the provider, please page the [facility] (Director on Call) for the Hospitalists listed on amion for assistance.  10/19/2022, 10:40 AM

## 2022-10-19 NOTE — Progress Notes (Signed)
Patient slept most of the night during this shift. Assisted to the restroom x2 with front wheel walker. Tolerated well. No complaints of pain or discomfort. Plan of care ongoing.

## 2022-10-19 NOTE — Progress Notes (Signed)
Chart/labs reviewed. Cr continues to improve. Would recommend staying the course for now. No changes at this junction from a nephrology perspective. Will formally be seen tomorrow. Please call on-call/covering nephrologist with any questions/concerns.  Gean Quint, MD [facility]

## 2022-10-19 NOTE — Progress Notes (Signed)
Patient is OSA but has been declining it here at hospital.  No machine in room.  Will continue to monitor.

## 2022-10-19 NOTE — Plan of Care (Signed)
  Problem: Education: Goal: Knowledge of General Education information will improve Description Including pain rating scale, medication(s)/side effects and non-pharmacologic comfort measures Outcome: Progressing   Problem: Health Behavior/Discharge Planning: Goal: Ability to manage health-related needs will improve Outcome: Progressing   

## 2022-10-20 ENCOUNTER — Other Ambulatory Visit: Payer: Self-pay | Admitting: Student

## 2022-10-20 ENCOUNTER — Inpatient Hospital Stay (HOSPITAL_COMMUNITY): Payer: 59

## 2022-10-20 ENCOUNTER — Ambulatory Visit
Admission: RE | Admit: 2022-10-20 | Discharge: 2022-10-20 | Disposition: A | Discharge status: Home / Self Care | Payer: 59 | Source: Ambulatory Visit | Attending: Gastroenterology | Admitting: Gastroenterology

## 2022-10-20 DIAGNOSIS — N179 Acute kidney failure, unspecified: Secondary | ICD-10-CM | POA: Diagnosis not present

## 2022-10-20 DIAGNOSIS — D696 Thrombocytopenia, unspecified: Secondary | ICD-10-CM | POA: Diagnosis not present

## 2022-10-20 DIAGNOSIS — K7290 Hepatic failure, unspecified without coma: Secondary | ICD-10-CM

## 2022-10-20 DIAGNOSIS — K7031 Alcoholic cirrhosis of liver with ascites: Secondary | ICD-10-CM

## 2022-10-20 DIAGNOSIS — G4733 Obstructive sleep apnea (adult) (pediatric): Secondary | ICD-10-CM | POA: Diagnosis not present

## 2022-10-20 DIAGNOSIS — N1832 Chronic kidney disease, stage 3b: Secondary | ICD-10-CM | POA: Diagnosis not present

## 2022-10-20 LAB — RENAL FUNCTION PANEL
Albumin: 3.1 g/dL — ABNORMAL LOW (ref 3.5–5.0)
Anion gap: 10 (ref 5–15)
BUN: 64 mg/dL — ABNORMAL HIGH (ref 8–23)
CO2: 19 mmol/L — ABNORMAL LOW (ref 22–32)
Calcium: 8.4 mg/dL — ABNORMAL LOW (ref 8.9–10.3)
Chloride: 109 mmol/L (ref 98–111)
Creatinine, Ser: 3.01 mg/dL — ABNORMAL HIGH (ref 0.44–1.00)
GFR, Estimated: 17 mL/min — ABNORMAL LOW (ref >60)
Glucose, Bld: 133 mg/dL — ABNORMAL HIGH (ref 70–99)
Phosphorus: 4.5 mg/dL (ref 2.5–4.6)
Potassium: 4 mmol/L (ref 3.5–5.1)
Sodium: 138 mmol/L (ref 135–145)

## 2022-10-20 MED ORDER — LACTULOSE 10 GM/15ML PO SOLN
20.0000 g | Freq: Every day | ORAL | Status: DC
  Filled 2022-10-20 (×5): qty 30

## 2022-10-20 MED ORDER — FUROSEMIDE 10 MG/ML IJ SOLN
40.0000 mg | Freq: Two times a day (BID) | INTRAMUSCULAR | Status: DC
  Administered 2022-10-20 (×2): 40 mg via INTRAVENOUS
  Filled 2022-10-20 (×3): qty 4

## 2022-10-20 NOTE — Progress Notes (Signed)
Mobility Specialist Progress Note:    10/20/22 1354  Mobility  Activity Ambulated with assistance to bathroom  Level of Assistance Standby assist, set-up cues, supervision of patient - no hands on  Assistive Device Front wheel walker  Distance Ambulated (ft) 30 ft  Activity Response Tolerated well  Mobility Referral Yes  $Mobility charge 1 Mobility   Pt was agreeable to mobility session after max encouragement. Tolerated well, pt requested assistance to bathroom half way through session. Returned pt to bed, call bell within reach, all needs met.   Royetta Crochet Mobility Specialist Please contact via [place] or  Rehab office at [phone]

## 2022-10-20 NOTE — Progress Notes (Signed)
Patient has been refusing CPAP, none in room.

## 2022-10-20 NOTE — Progress Notes (Signed)
Subjective:  UOP only recorded at 800-  crt has trended down over the weekend-  weight is staying up   Objective Vital signs in last 24 hours: Vitals:   10/19/22 0500 10/19/22 1357 10/19/22 1953 10/20/22 0336  BP:  (!) 126/44 (!) 124/56 (!) 127/45  Pulse:  69 67 74  Resp:  '17 20 18  '$ Temp:  98 F (36.7 C) 98.3 F (36.8 C) 98.3 F (36.8 C)  TempSrc:  Oral Oral Oral  SpO2:  91% 100% 100%  Weight: 129 kg   129.9 kg  Height:       Weight change: 0.865 kg  Intake/Output Summary (Last 24 hours) at 10/20/2022 0817 Last data filed at 10/20/2022 0100 Gross per 24 hour  Intake 480 ml  Output 800 ml  Net -320 ml    Assessment/Plan: 67 year old WF with cirrhosis req freq paracentesis-  now presenting with subacute A on CRF since 2/7 in the setting of paracentesis/diuresis and soft BP 1.Renal- crt 1.4 on 2/7-   3.3 on 2/20 and 4.3 on 2/28.  Has had at least 2 large volume paracenteses over this time period as well as being on lasix, large dose neurontin and had lowish BP.  This has all the hallmarks of being a hemodynamic related kidney injury-  on the continuum of HRS.  U/A and renal ultrasound essentially normal.  For now I agree with holding high dose neurontin and lasix.  Have added back midodrine and did albumin supplementation for 48 hours.  crt is trending down-  is now at 3.  Due to her volume overload I will add back some lasix  2. Hypertension/volume  - volume overloaded and hypotensive-  will try midodrine and albumin to imrpove renal perfusion-  crt improved-  will add back lasix 3. Anemia  - anemic but actually better now than has been after transfusion.  I see that pt is resistant to further retacrit as she felt it caused her issue last hospitalization.  I did not push     Rachel Huber    Labs: Basic Metabolic Panel: Recent Labs  Lab 10/18/22 0436 10/19/22 0504 10/20/22 0440  NA 140 138 138  K 4.1 3.8 4.0  CL 110 109 109  CO2 19* 20* 19*  GLUCOSE 92 140* 133*   BUN 67* 69* 64*  CREATININE 3.61* 3.40* 3.01*  CALCIUM 8.6* 8.3* 8.4*  PHOS 6.0* 5.1* 4.5   Liver Function Tests: Recent Labs  Lab 10/17/22 0429 10/17/22 0430 10/18/22 0436 10/19/22 0504 10/20/22 0440  AST 47*  --   --   --   --   ALT 8  --   --   --   --   ALKPHOS 79  --   --   --   --   BILITOT 1.5*  --   --   --   --   PROT 4.8*  --   --   --   --   ALBUMIN 2.8*   < > 3.4* 3.2* 3.1*   < > = values in this interval not displayed.   No results for input(s): "LIPASE", "AMYLASE" in the last 168 hours. Recent Labs  Lab 10/17/22 0429 10/18/22 0436  AMMONIA 95* 72*   CBC: Recent Labs  Lab 10/15/22 0759 10/16/22 0430 10/17/22 0429 10/18/22 0436  WBC 3.2* 2.6* 2.8* 2.9*  NEUTROABS 2.0  --   --   --   HGB 8.8* 7.3* 7.0* 7.0*  HCT 28.3* 23.4*  22.4* 22.3*  MCV 81.8 80.7 81.2 81.1  PLT 82* 68* 62* 60*   Cardiac Enzymes: No results for input(s): "CKTOTAL", "CKMB", "CKMBINDEX", "TROPONINI" in the last 168 hours. CBG: No results for input(s): "GLUCAP" in the last 168 hours.  Iron Studies: No results for input(s): "IRON", "TIBC", "TRANSFERRIN", "FERRITIN" in the last 72 hours. Studies/Results: No results found. Medications: Infusions:    Scheduled Medications:  atorvastatin  10 mg Oral QPM   ciprofloxacin  500 mg Oral Q breakfast   lactulose  20 g Oral BID   midodrine  10 mg Oral TID WC   nystatin   Topical BID   polyethylene glycol  17 g Oral BID   sertraline  50 mg Oral Daily    have reviewed scheduled and prn medications.  Physical Exam: General:  obese, just woke up Heart: RRR Lungs: dec BS at bases Abdomen: obese, distended as well  Extremities: pitting edema     10/20/2022,8:17 AM  LOS: 4 days

## 2022-10-20 NOTE — Progress Notes (Signed)
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  She had a PICC line placed and this has subsequently been removed since completion of IV antibiotics.  Patient is status post TIPS at [facility] on 08/20/2022.  And she is status post embolization of hemorrhoids 09/05/2022.  Patient is followed closely by gastroenterology and hepatology liver transplant at [facility].  She reportedly had last transfusion 10/09/2022.  She has chronic swelling in the legs and had been on diuretics but she was recently taken off all diuretics around 09/24/2022.  Around that time she was noted to have a small bump in creatinine.  She has had subsequent blood work done that shows that her creatinine is continue to climb and now up to greater than 4.  She does not seem to have symptoms for this now.  Her hematology clinic informed her about the abnormal labs and requested that she come into the hospital for further management.   Assessment and Plan:  AKI on CKD stage IIIb -Continue to hold all diuretics at this time -Patient appears to be intravascularly volume depleted initially -Gently hydrate with IV fluid initially, now off all IV fluid -Renally dose all medications as appropriate, avoiding nephrotoxic agents -ED consulted nephrology who  is following -nephrology team starting IV albumin infusions with some improvement in creatinine noted -creatinine improved more today to 3.01   Chronic anemia -Secondary to thalassemia disease, managed by hematologist -Secondary to chronic kidney disease   Thrombocytopenia -Secondary chronic liver disease -No active bleeding complications at this time -Follow CBC daily -platelets trended down, but no active bleeding at this time    OSA on CPAP -offer nightly CPAP while in hospital   Liver cirrhosis secondary to NASH -Patient is followed closely by [facility] hepatology -Patient planning to have teeth removed as a final requirement for liver transplant consideration -Status post recent paracentesis 10/14/2022 where 8 L of fluid removed -IV albumin per nephrology -ammonia level elevated, ordered lactulose treatments but pt refuses to take in hospital  -fortunately had a large BM and ammonia level trending down and mentation is good, did not order a repeat ammonia unless there is a change in mentation  -Pt is due for weekly paracentesis and [place] ascites confirms enough fluid present for LVP.  Will check with nephrology to confirm if safe to do this now given fragility of her recovering kidney function.  We likely could arrange for LVP on 3/5 if ok with nephrology. Pt usually receives albumin with each LVP procedure.      DVT prophylaxis: TED hoses Code Status: Full  Family Communication:  Disposition:  Remains inpatient appropriate because: IV treatments    Consultants:  Nephrology  Procedures:   Antimicrobials:    Subjective: Pt says it is time for her weekly paracentesis to be done.      Objective: Vitals:   10/19/22 1357 10/19/22 1953 10/20/22 0336 10/20/22 1213  BP: (!) 126/44 (!) 124/56 (!) 127/45 (!) 117/43  Pulse: 69 67 74 71  Resp: '17 20 18 18  '$ Temp: 98 F (36.7 C) 98.3 F (36.8 C) 98.3 F (36.8 C) 97.9 F (36.6 C)  TempSrc: Oral Oral Oral   SpO2: 91% 100% 100% 95%   Weight:   129.9 kg   Height:        Intake/Output Summary (Last 24 hours) at 10/20/2022 1617 Last data filed at 10/20/2022 1300 Gross per 24 hour  Intake 480 ml  Output 500 ml  Net -20 ml   Filed Weights   10/18/22 0547 10/19/22 0500 10/20/22 0336  Weight: 130.8 kg 129 kg 129.9 kg   Examination:  General exam: Appears calm and comfortable  Respiratory system: Clear to auscultation. Respiratory effort normal. Cardiovascular system: normal S1 & S2 heard. No JVD, murmurs, rubs, gallops or clicks. No pedal edema. Gastrointestinal system: Abdomen is very distended with fluid waves, soft. No organomegaly or masses felt. Normal bowel sounds heard. Central nervous system: Alert and oriented. No focal neurological deficits. Extremities: 2+ edema BLE Symmetric 5 x 5 power. Skin: No rashes, lesions or ulcers. Psychiatry: Judgement and insight appear normal. Mood & affect appropriate.   Data Reviewed: I have personally reviewed following labs and imaging studies  CBC: Recent Labs  Lab 10/15/22 0759 10/16/22 0430 10/17/22 0429 10/18/22 0436  WBC 3.2* 2.6* 2.8* 2.9*  NEUTROABS 2.0  --   --   --   HGB 8.8* 7.3* 7.0* 7.0*  HCT 28.3* 23.4* 22.4* 22.3*  MCV 81.8 80.7 81.2 81.1  PLT 82* 68* 62* 60*    Basic Metabolic Panel: Recent Labs  Lab 10/15/22 0759 10/15/22 0936 10/16/22 0430 10/17/22 0429 10/17/22 0430 10/18/22 0436 10/19/22 0504 10/20/22 0440  NA  --    < > 137  --  138 140 138 138  K  --    < > 4.0  --  3.9 4.1 3.8 4.0  CL  --    < > 110  --  112* 110 109 109  CO2  --    < > 19*  --  18* 19* 20* 19*  GLUCOSE  --    < > 109*  --  90 92 140* 133*  BUN  --    < > 67*  --  70* 67* 69* 64*  CREATININE  --    < > 4.09*  --  3.74* 3.61* 3.40* 3.01*  CALCIUM  --    < > 7.8*  --  7.8* 8.6* 8.3* 8.4*  MG 2.1  --  2.0 2.0  --   --   --   --   PHOS 6.8*  --  6.6*  --  6.1* 6.0* 5.1* 4.5   < > = values in this interval not displayed.    CBG: No results for input(s):  "GLUCAP" in the last 168 hours.  No results found for this or any previous visit (from the past 240 hour(s)).   Radiology Studies: [place] ASCITES (ABDOMEN LIMITED)  Result Date: 10/20/2022 CLINICAL DATA:  History cirrhosis for evaluation of ascites status post paracentesis on 10/14/2018 EXAM: LIMITED ABDOMEN ULTRASOUND FOR ASCITES TECHNIQUE: Limited ultrasound survey for ascites was performed in all four abdominal quadrants. COMPARISON:  Renal ultrasound dated 10/16/2022, ultrasound paracentesis dated 10/14/2022 FINDINGS: Interval reaccumulation of large volume ascites, similar to slightly larger volume compared to preprocedural images on 10/14/2022. IMPRESSION: Interval reaccumulation of large volume ascites, similar to slightly larger volume compared  to preprocedural images on 10/14/2022. Electronically Signed   By: Darrin Nipper M.D.   On: 10/20/2022 14:53    Scheduled Meds:  atorvastatin  10 mg Oral QPM   ciprofloxacin  500 mg Oral Q breakfast   furosemide  40 mg Intravenous BID   lactulose  20 g Oral BID   midodrine  10 mg Oral TID WC   nystatin   Topical BID   polyethylene glycol  17 g Oral BID   sertraline  50 mg Oral Daily   Continuous Infusions:   LOS: 4 days   Time spent: 35 mins  Cordera Stineman Wynetta Emery, MD How to contact the [facility] Attending or Consulting provider [place] or covering provider during after hours [place], for this patient?  Check the care team in [facility] and look for a) attending/consulting TRH provider listed and b) the [facility] team listed Log into [email or web address] and use [place]'s universal password to access. If you do not have the password, please contact the hospital operator. Locate the [facility] provider you are looking for under [facility] and page to a number that you can be directly reached. If you still have difficulty reaching the provider, please page the [facility] (Director on Call) for the Hospitalists listed on amion for assistance.  10/20/2022, 4:17 PM

## 2022-10-21 ENCOUNTER — Inpatient Hospital Stay (HOSPITAL_COMMUNITY): Payer: 59

## 2022-10-21 ENCOUNTER — Ambulatory Visit
Admission: RE | Admit: 2022-10-21 | Discharge: 2022-10-21 | Disposition: A | Discharge status: Home / Self Care | Payer: 59 | Source: Ambulatory Visit | Attending: Physician Assistant | Admitting: Physician Assistant

## 2022-10-21 ENCOUNTER — Encounter (HOSPITAL_COMMUNITY): Payer: Self-pay | Admitting: Family Medicine

## 2022-10-21 DIAGNOSIS — N1832 Chronic kidney disease, stage 3b: Secondary | ICD-10-CM | POA: Diagnosis not present

## 2022-10-21 DIAGNOSIS — G4733 Obstructive sleep apnea (adult) (pediatric): Secondary | ICD-10-CM | POA: Diagnosis not present

## 2022-10-21 DIAGNOSIS — N179 Acute kidney failure, unspecified: Secondary | ICD-10-CM | POA: Diagnosis not present

## 2022-10-21 DIAGNOSIS — D696 Thrombocytopenia, unspecified: Secondary | ICD-10-CM | POA: Diagnosis not present

## 2022-10-21 LAB — RENAL FUNCTION PANEL
Albumin: 2.9 g/dL — ABNORMAL LOW (ref 3.5–5.0)
Anion gap: 11 (ref 5–15)
BUN: 61 mg/dL — ABNORMAL HIGH (ref 8–23)
CO2: 20 mmol/L — ABNORMAL LOW (ref 22–32)
Calcium: 8.5 mg/dL — ABNORMAL LOW (ref 8.9–10.3)
Chloride: 109 mmol/L (ref 98–111)
Creatinine, Ser: 2.76 mg/dL — ABNORMAL HIGH (ref 0.44–1.00)
GFR, Estimated: 19 mL/min — ABNORMAL LOW (ref >60)
Glucose, Bld: 121 mg/dL — ABNORMAL HIGH (ref 70–99)
Phosphorus: 4.6 mg/dL (ref 2.5–4.6)
Potassium: 3.9 mmol/L (ref 3.5–5.1)
Sodium: 140 mmol/L (ref 135–145)

## 2022-10-21 MED ORDER — ALBUMIN HUMAN 25 % IV SOLN
25.0000 g | Freq: Two times a day (BID) | INTRAVENOUS | Status: DC | PRN
  Administered 2022-10-21: 25 g via INTRAVENOUS
  Filled 2022-10-21 (×2): qty 100

## 2022-10-21 MED ORDER — FUROSEMIDE 10 MG/ML IJ SOLN
80.0000 mg | Freq: Two times a day (BID) | INTRAMUSCULAR | Status: DC
  Administered 2022-10-21 – 2022-10-23 (×5): 80 mg via INTRAVENOUS
  Filled 2022-10-21 (×5): qty 8

## 2022-10-21 MED ORDER — LIDOCAINE HCL (PF) 2 % IJ SOLN
INTRAMUSCULAR | Status: AC
  Filled 2022-10-21: qty 10

## 2022-10-21 NOTE — Progress Notes (Signed)
Patient has been refusing CPAP, none in room at this time.

## 2022-10-21 NOTE — Progress Notes (Signed)
Patient returned from paracentesis in stable condition, ambulated 1 assist to [facility] and back to bed, receiving second dose of albumin at this time.

## 2022-10-21 NOTE — Progress Notes (Signed)
Subjective:  UOP only recorded at 700 but some was missed-  crt has trended down again-  weight is staying up.  I always catch her when she is sleeping-   just waking up  Objective Vital signs in last 24 hours: Vitals:   10/20/22 0336 10/20/22 1213 10/20/22 1944 10/21/22 0559  BP: (!) 127/45 (!) 117/43 (!) 120/52 (!) 126/49  Pulse: 74 71 68 72  Resp: '18 18 14 12  '$ Temp: 98.3 F (36.8 C) 97.9 F (36.6 C) 98 F (36.7 C) 98 F (36.7 C)  TempSrc: Oral  Oral Oral  SpO2: 100% 95% 100% 98%  Weight: 129.9 kg   132.8 kg  Height:       Weight change: 2.935 kg  Intake/Output Summary (Last 24 hours) at 10/21/2022 0734 Last data filed at 10/21/2022 0100 Gross per 24 hour  Intake 480 ml  Output 700 ml  Net -220 ml    Assessment/Plan: 67 year old WF with cirrhosis req freq paracentesis-  now presenting with subacute A on CRF since 2/7 in the setting of paracentesis/diuresis and soft BP 1.Renal- crt 1.4 on 2/7-   3.3 on 2/20 and 4.3 on 2/28.  Has had at least 2 large volume paracenteses over this time period as well as being on lasix, large dose neurontin and had lowish BP.  This has all the hallmarks of being a hemodynamic related kidney injury-  on the continuum of HRS.  U/A and renal ultrasound essentially normal.  For now I agree with holding high dose neurontin.  Have added back midodrine and did albumin supplementation for 48 hours.  crt is trending down-  is now at 2.7.  Due to her volume overload I added back some lasix on 3/4-  will inc dose today.  Per the hospitalist she is also due for her weekly paracentesis so it will be done   2. Hypertension/volume  - volume overloaded and hypotensive-  on midodrine and albumin short term to imrpove renal perfusion-  crt improved-  added back lasix on 3/4-  will inc dose today to see if we can make a dent in her overload 3. Anemia  - anemic but actually better now than has been after transfusion.  I see that pt is resistant to further retacrit as she felt  it caused her issue last hospitalization.  I did not push -  am going to check cbc in AM    [facility] Ridhi Hoffert    Labs: Basic Metabolic Panel: Recent Labs  Lab 10/19/22 0504 10/20/22 0440 10/21/22 0411  NA 138 138 140  K 3.8 4.0 3.9  CL 109 109 109  CO2 20* 19* 20*  GLUCOSE 140* 133* 121*  BUN 69* 64* 61*  CREATININE 3.40* 3.01* 2.76*  CALCIUM 8.3* 8.4* 8.5*  PHOS 5.1* 4.5 4.6   Liver Function Tests: Recent Labs  Lab 10/17/22 0429 10/17/22 0430 10/19/22 0504 10/20/22 0440 10/21/22 0411  AST 47*  --   --   --   --   ALT 8  --   --   --   --   ALKPHOS 79  --   --   --   --   BILITOT 1.5*  --   --   --   --   PROT 4.8*  --   --   --   --   ALBUMIN 2.8*   < > 3.2* 3.1* 2.9*   < > = values in this interval not displayed.  No results for input(s): "LIPASE", "AMYLASE" in the last 168 hours. Recent Labs  Lab 10/17/22 0429 10/18/22 0436  AMMONIA 95* 72*   CBC: Recent Labs  Lab 10/15/22 0759 10/16/22 0430 10/17/22 0429 10/18/22 0436  WBC 3.2* 2.6* 2.8* 2.9*  NEUTROABS 2.0  --   --   --   HGB 8.8* 7.3* 7.0* 7.0*  HCT 28.3* 23.4* 22.4* 22.3*  MCV 81.8 80.7 81.2 81.1  PLT 82* 68* 62* 60*   Cardiac Enzymes: No results for input(s): "CKTOTAL", "CKMB", "CKMBINDEX", "TROPONINI" in the last 168 hours. CBG: No results for input(s): "GLUCAP" in the last 168 hours.  Iron Studies: No results for input(s): "IRON", "TIBC", "TRANSFERRIN", "FERRITIN" in the last 72 hours. Studies/Results: [place] ASCITES (ABDOMEN LIMITED)  Result Date: 10/20/2022 CLINICAL DATA:  History cirrhosis for evaluation of ascites status post paracentesis on 10/14/2018 EXAM: LIMITED ABDOMEN ULTRASOUND FOR ASCITES TECHNIQUE: Limited ultrasound survey for ascites was performed in all four abdominal quadrants. COMPARISON:  Renal ultrasound dated 10/16/2022, ultrasound paracentesis dated 10/14/2022 FINDINGS: Interval reaccumulation of large volume ascites, similar to slightly larger volume compared to  preprocedural images on 10/14/2022. IMPRESSION: Interval reaccumulation of large volume ascites, similar to slightly larger volume compared to preprocedural images on 10/14/2022. Electronically Signed   By: Darrin Nipper M.D.   On: 10/20/2022 14:53   Medications: Infusions:    Scheduled Medications:  atorvastatin  10 mg Oral QPM   ciprofloxacin  500 mg Oral Q breakfast   furosemide  40 mg Intravenous BID   lactulose  20 g Oral Daily   midodrine  10 mg Oral TID WC   nystatin   Topical BID   polyethylene glycol  17 g Oral BID   sertraline  50 mg Oral Daily    have reviewed scheduled and prn medications.  Physical Exam: General:  obese, just woke up Heart: RRR Lungs: dec BS at bases Abdomen: obese, distended as well /fluid wave  Extremities: pitting edema     10/21/2022,7:34 AM  LOS: 5 days

## 2022-10-21 NOTE — Progress Notes (Signed)
Mobility Specialist Progress Note:    10/21/22 1340  Mobility  Activity Ambulated with assistance to bathroom  Level of Assistance Standby assist, set-up cues, supervision of patient - no hands on  Assistive Device Front wheel walker  Distance Ambulated (ft) 30 ft  Activity Response Tolerated well  Mobility Referral Yes  $Mobility charge 1 Mobility   Pt was agreeable to mobility session, B>BR. Tolerated well, asx throughout. Returned pt to bed with all needs met, call bell in reach, alarm on.   Royetta Crochet Mobility Specialist Please contact via [facility] or  Rehab office at [phone]

## 2022-10-21 NOTE — Plan of Care (Signed)

## 2022-10-21 NOTE — Progress Notes (Signed)
Paracentesis complete, 7 liters ascites removed. No signs of distress.

## 2022-10-21 NOTE — Progress Notes (Signed)
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  She had a PICC line placed and this has subsequently been removed since completion of IV antibiotics.  Patient is status post TIPS at [facility] on 08/20/2022.  And she is status post embolization of hemorrhoids 09/05/2022.  Patient is followed closely by gastroenterology and hepatology liver transplant at [facility].  She reportedly had last transfusion 10/09/2022.  She has chronic swelling in the legs and had been on diuretics but she was recently taken off all diuretics around 09/24/2022.  Around that time she was noted to have a small bump in creatinine.  She has had subsequent blood work done that shows that her creatinine is continue to climb and now up to greater than 4.  She does not seem to have symptoms for this now.  Her hematology clinic informed her about the abnormal labs and requested that she come into the hospital for further management.   Assessment and Plan:  AKI on CKD stage IIIb -Continue to hold all diuretics at this time -Patient appears to be intravascularly volume depleted initially -Gently hydrate with IV fluid initially, now off all IV fluid -Renally dose all medications as appropriate, avoiding nephrotoxic agents -ED consulted nephrology who  is following -nephrology team starting IV albumin infusions with some improvement in creatinine noted -creatinine improved more today to 2.76   Chronic anemia -Secondary to thalassemia disease, managed by hematologist -Secondary to chronic kidney disease   Thrombocytopenia -Secondary chronic liver disease -No active bleeding complications at this time -Follow CBC daily -platelets trended down, but no active bleeding at this time    OSA on CPAP -offer nightly CPAP while in hospital   Liver cirrhosis secondary to NASH -Patient is followed closely by [place] hepatology -Patient planning to have teeth removed as a final requirement for liver transplant consideration -Status post recent paracentesis 10/14/2022 where 8 L of fluid removed -IV albumin per nephrology -ammonia level elevated, ordered lactulose treatments but pt refuses to take in hospital  -fortunately had a large BM and ammonia level trending down and mentation is good, did not order a repeat ammonia unless there is a change in mentation  -Pt is due for weekly paracentesis and [place] ascites confirms enough fluid present for LVP.  Discussed with nephrology ok to arrange LVP with instructions to give IV albumin 25 gm prior to procedure and 25 gm IV albumin after paracentesis.      DVT prophylaxis: TED hoses Code Status: Full  Family Communication:  Disposition: anticipate home when medically stabilized Remains inpatient appropriate because: IV treatments    Consultants:  Nephrology  Procedures:   Antimicrobials:    Subjective: Pt reports time for weekly paracentesis to be done.  Feeling abdomen is distended.     Objective: Vitals:   10/20/22 1944 10/21/22 0559 10/21/22 0927 10/21/22 1015  BP: (!) 120/52 (!) 126/49 (!) 116/56 (!) 110/46  Pulse: 68 72 73 72  Resp: '14 12 18 18  '$ Temp: 98 F (36.7 C) 98 F (36.7 C) 98 F (36.7 C)   TempSrc: Oral Oral Oral   SpO2: 100% 98% 98% 98%  Weight:  132.8 kg    Height:         Intake/Output Summary (Last 24 hours) at 10/21/2022 1259 Last data filed at 10/21/2022 0100 Gross per 24 hour  Intake 240 ml  Output 700 ml  Net -460 ml   Filed Weights   10/19/22 0500 10/20/22 0336 10/21/22 0559  Weight: 129 kg 129.9 kg 132.8 kg   Examination:  General exam: Appears calm and comfortable  Respiratory system: Clear to auscultation. Respiratory effort normal. Cardiovascular system: normal S1 & S2 heard. No JVD, murmurs, rubs, gallops or clicks. No pedal edema. Gastrointestinal system: Abdomen is very distended with fluid waves, soft. No organomegaly or masses felt. Normal bowel sounds heard. Central nervous system: Alert and oriented. No focal neurological deficits. Extremities: 2+ edema BLE Symmetric 5 x 5 power. Skin: No rashes, lesions or ulcers. Psychiatry: Judgement and insight appear normal. Mood & affect appropriate.   Data Reviewed: I have personally reviewed following labs and imaging studies  CBC: Recent Labs  Lab 10/15/22 0759 10/16/22 0430 10/17/22 0429 10/18/22 0436  WBC 3.2* 2.6* 2.8* 2.9*  NEUTROABS 2.0  --   --   --   HGB 8.8* 7.3* 7.0* 7.0*  HCT 28.3* 23.4* 22.4* 22.3*  MCV 81.8 80.7 81.2 81.1  PLT 82* 68* 62* 60*    Basic Metabolic Panel: Recent Labs  Lab 10/15/22 0759 10/15/22 0936 10/16/22 0430 10/17/22 0429 10/17/22 0430 10/18/22 0436 10/19/22 0504 10/20/22 0440 10/21/22 0411  NA  --    < > 137  --  138 140 138 138 140  K  --    < > 4.0  --  3.9 4.1 3.8 4.0 3.9  CL  --    < > 110  --  112* 110 109 109 109  CO2  --    < > 19*  --  18* 19* 20* 19* 20*  GLUCOSE  --    < > 109*  --  90 92 140* 133* 121*  BUN  --    < > 67*  --  70* 67* 69* 64* 61*  CREATININE  --    < > 4.09*  --  3.74* 3.61* 3.40* 3.01* 2.76*  CALCIUM  --    < > 7.8*  --  7.8* 8.6* 8.3* 8.4* 8.5*  MG 2.1  --  2.0 2.0  --   --   --   --   --   PHOS 6.8*  --  6.6*  --  6.1* 6.0* 5.1* 4.5 4.6   < > = values in this interval not displayed.    CBG: No  results for input(s): "GLUCAP" in the last 168 hours.  No results found for this or any previous visit (from the past 240 hour(s)).   Radiology Studies: US Paracentesis  Result Date: 10/21/2022 INDICATION: Ascites. EXAM: ULTRASOUND GUIDED  PARACENTESIS MEDICATIONS: None. COMPLICATIONS: None immediate. PROCEDURE: Informed written consent was obtained from the patient after a discussion of the risks, benefits and alternatives to treatment. A timeout was performed prior to the initiation of the procedure. Initial ultrasound scanning demonstrates a large amount of ascites within the right lower abdominal quadrant. The right lower abdomen was prepped and draped in the usual sterile fashion. 1% lidocaine was used  for local anesthesia. Following this, a centesis catheter was introduced. The paracentesis was performed. The catheter was removed and a dressing was applied. The patient tolerated the procedure well without immediate post procedural complication. FINDINGS: A total of approximately 7 L of straw-colored fluid was removed. IMPRESSION: Successful ultrasound-guided paracentesis yielding 7 liters of peritoneal fluid. Electronically Signed   By: Franki Cabot M.D.   On: 10/21/2022 10:58   [place] ASCITES (ABDOMEN LIMITED)  Result Date: 10/20/2022 CLINICAL DATA:  History cirrhosis for evaluation of ascites status post paracentesis on 10/14/2018 EXAM: LIMITED ABDOMEN ULTRASOUND FOR ASCITES TECHNIQUE: Limited ultrasound survey for ascites was performed in all four abdominal quadrants. COMPARISON:  Renal ultrasound dated 10/16/2022, ultrasound paracentesis dated 10/14/2022 FINDINGS: Interval reaccumulation of large volume ascites, similar to slightly larger volume compared to preprocedural images on 10/14/2022. IMPRESSION: Interval reaccumulation of large volume ascites, similar to slightly larger volume compared to preprocedural images on 10/14/2022. Electronically Signed   By: Darrin Nipper M.D.   On: 10/20/2022 14:53     Scheduled Meds:  atorvastatin  10 mg Oral QPM   ciprofloxacin  500 mg Oral Q breakfast   furosemide  80 mg Intravenous BID   lactulose  20 g Oral Daily   lidocaine HCl (PF)       midodrine  10 mg Oral TID WC   nystatin   Topical BID   polyethylene glycol  17 g Oral BID   sertraline  50 mg Oral Daily   Continuous Infusions:  albumin human 25 g (10/21/22 0916)    LOS: 5 days   Time spent: 35 mins  Wren Pryce Wynetta Emery, MD How to contact the [facility] Attending or Consulting provider [place] or covering provider during after hours [place], for this patient?  Check the care team in [facility] and look for a) attending/consulting TRH provider listed and b) the [facility] team listed Log into [email or web address] and use [place]'s universal password to access. If you do not have the password, please contact the hospital operator. Locate the [facility] provider you are looking for under [facility] and page to a number that you can be directly reached. If you still have difficulty reaching the provider, please page the [facility] (Director on Call) for the Hospitalists listed on amion for assistance.  10/21/2022, 12:59 PM

## 2022-10-22 ENCOUNTER — Inpatient Hospital Stay: Payer: 59

## 2022-10-22 DIAGNOSIS — N1832 Chronic kidney disease, stage 3b: Secondary | ICD-10-CM | POA: Diagnosis not present

## 2022-10-22 DIAGNOSIS — N179 Acute kidney failure, unspecified: Secondary | ICD-10-CM | POA: Diagnosis not present

## 2022-10-22 DIAGNOSIS — K7581 Nonalcoholic steatohepatitis (NASH): Secondary | ICD-10-CM | POA: Diagnosis not present

## 2022-10-22 DIAGNOSIS — N189 Chronic kidney disease, unspecified: Secondary | ICD-10-CM | POA: Diagnosis not present

## 2022-10-22 LAB — CBC
HCT: 23.4 % — ABNORMAL LOW (ref 36.0–46.0)
Hemoglobin: 7.3 g/dL — ABNORMAL LOW (ref 12.0–15.0)
MCH: 24.8 pg — ABNORMAL LOW (ref 26.0–34.0)
MCHC: 31.2 g/dL (ref 30.0–36.0)
MCV: 79.6 fL — ABNORMAL LOW (ref 80.0–100.0)
Platelets: 76 10*3/uL — ABNORMAL LOW (ref 150–400)
RBC: 2.94 MIL/uL — ABNORMAL LOW (ref 3.87–5.11)
RDW: 22.3 % — ABNORMAL HIGH (ref 11.5–15.5)
WBC: 3 10*3/uL — ABNORMAL LOW (ref 4.0–10.5)
nRBC: 0 % (ref 0.0–0.2)

## 2022-10-22 LAB — URINALYSIS, ROUTINE W REFLEX MICROSCOPIC
Bilirubin Urine: NEGATIVE
Glucose, UA: NEGATIVE mg/dL
Ketones, ur: NEGATIVE mg/dL
Leukocytes,Ua: NEGATIVE
Nitrite: NEGATIVE
Protein, ur: NEGATIVE mg/dL
Specific Gravity, Urine: 1.006 (ref 1.005–1.030)
pH: 5 (ref 5.0–8.0)

## 2022-10-22 LAB — RENAL FUNCTION PANEL
Albumin: 2.7 g/dL — ABNORMAL LOW (ref 3.5–5.0)
Anion gap: 10 (ref 5–15)
BUN: 57 mg/dL — ABNORMAL HIGH (ref 8–23)
CO2: 19 mmol/L — ABNORMAL LOW (ref 22–32)
Calcium: 8.1 mg/dL — ABNORMAL LOW (ref 8.9–10.3)
Chloride: 110 mmol/L (ref 98–111)
Creatinine, Ser: 2.45 mg/dL — ABNORMAL HIGH (ref 0.44–1.00)
GFR, Estimated: 21 mL/min — ABNORMAL LOW (ref >60)
Glucose, Bld: 110 mg/dL — ABNORMAL HIGH (ref 70–99)
Phosphorus: 4.5 mg/dL (ref 2.5–4.6)
Potassium: 3.4 mmol/L — ABNORMAL LOW (ref 3.5–5.1)
Sodium: 139 mmol/L (ref 135–145)

## 2022-10-22 LAB — SODIUM, URINE, RANDOM: Sodium, Ur: 93 mmol/L

## 2022-10-22 NOTE — Progress Notes (Signed)
Pt. Remained in bed resting most of the night/shift. Pt. Did not complain of any pain or concerns. Pt.'s belongings and call light at bedside within reach. Bed in lowest position, call light within reach, bed alarm on, safety precautions implemented.

## 2022-10-22 NOTE — Progress Notes (Signed)
Patient ID: Rachel Huber, female   DOB: 1955/08/24, 67 y.o.   MRN: [id number] S: Feels well this morning, no new complaints. O:BP (!) 116/43 (BP Location: Left Arm)   Pulse 70   Temp 97.9 F (36.6 C) (Oral)   Resp 14   Ht '5\' 7"'$  (1.702 m)   Wt 114.4 kg   SpO2 97%   BMI 39.50 kg/m   Intake/Output Summary (Last 24 hours) at 10/22/2022 0943 Last data filed at 10/21/2022 2300 Gross per 24 hour  Intake 384.21 ml  Output 2400 ml  Net -2015.79 ml   Intake/Output: I/O last 3 completed shifts: In: 624.2 [P.O.:480; IV Piggyback:144.2] Out: 3100 [Urine:3100]  Intake/Output this shift:  No intake/output data recorded. Weight change: -18.4 kg Gen: NAD CVS: RRR Resp:CTA with decreased BS at bases Abd: obese, +BS, soft, NT/ND Ext: brawny edema bilaterally  Recent Labs  Lab 10/16/22 0430 10/17/22 0429 10/17/22 0430 10/18/22 0436 10/19/22 0504 10/20/22 0440 10/21/22 0411 10/22/22 0430  NA 137  --  138 140 138 138 140 139  K 4.0  --  3.9 4.1 3.8 4.0 3.9 3.4*  CL 110  --  112* 110 109 109 109 110  CO2 19*  --  18* 19* 20* 19* 20* 19*  GLUCOSE 109*  --  90 92 140* 133* 121* 110*  BUN 67*  --  70* 67* 69* 64* 61* 57*  CREATININE 4.09*  --  3.74* 3.61* 3.40* 3.01* 2.76* 2.45*  ALBUMIN 2.3* 2.8* 2.9* 3.4* 3.2* 3.1* 2.9* 2.7*  CALCIUM 7.8*  --  7.8* 8.6* 8.3* 8.4* 8.5* 8.1*  PHOS 6.6*  --  6.1* 6.0* 5.1* 4.5 4.6 4.5  AST  --  47*  --   --   --   --   --   --   ALT  --  8  --   --   --   --   --   --    Liver Function Tests: Recent Labs  Lab 10/17/22 0429 10/17/22 0430 10/20/22 0440 10/21/22 0411 10/22/22 0430  AST 47*  --   --   --   --   ALT 8  --   --   --   --   ALKPHOS 79  --   --   --   --   BILITOT 1.5*  --   --   --   --   PROT 4.8*  --   --   --   --   ALBUMIN 2.8*   < > 3.1* 2.9* 2.7*   < > = values in this interval not displayed.   No results for input(s): "LIPASE", "AMYLASE" in the last 168 hours. Recent Labs  Lab 10/17/22 0429 10/18/22 0436  AMMONIA 95* 72*    CBC: Recent Labs  Lab 10/16/22 0430 10/17/22 0429 10/18/22 0436 10/22/22 0544  WBC 2.6* 2.8* 2.9* 3.0*  HGB 7.3* 7.0* 7.0* 7.3*  HCT 23.4* 22.4* 22.3* 23.4*  MCV 80.7 81.2 81.1 79.6*  PLT 68* 62* 60* 76*   Cardiac Enzymes: No results for input(s): "CKTOTAL", "CKMB", "CKMBINDEX", "TROPONINI" in the last 168 hours. CBG: No results for input(s): "GLUCAP" in the last 168 hours.  Iron Studies: No results for input(s): "IRON", "TIBC", "TRANSFERRIN", "FERRITIN" in the last 72 hours. Studies/Results: US Paracentesis  Result Date: 10/21/2022 INDICATION: Ascites. EXAM: ULTRASOUND GUIDED  PARACENTESIS MEDICATIONS: None. COMPLICATIONS: None immediate. PROCEDURE: Informed written consent was obtained from the patient after a discussion of the risks, benefits  and alternatives to treatment. A timeout was performed prior to the initiation of the procedure. Initial ultrasound scanning demonstrates a large amount of ascites within the right lower abdominal quadrant. The right lower abdomen was prepped and draped in the usual sterile fashion. 1% lidocaine was used for local anesthesia. Following this, a centesis catheter was introduced. The paracentesis was performed. The catheter was removed and a dressing was applied. The patient tolerated the procedure well without immediate post procedural complication. FINDINGS: A total of approximately 7 L of straw-colored fluid was removed. IMPRESSION: Successful ultrasound-guided paracentesis yielding 7 liters of peritoneal fluid. Electronically Signed   By: Franki Cabot M.D.   On: 10/21/2022 10:58   [place] ASCITES (ABDOMEN LIMITED)  Result Date: 10/20/2022 CLINICAL DATA:  History cirrhosis for evaluation of ascites status post paracentesis on 10/14/2018 EXAM: LIMITED ABDOMEN ULTRASOUND FOR ASCITES TECHNIQUE: Limited ultrasound survey for ascites was performed in all four abdominal quadrants. COMPARISON:  Renal ultrasound dated 10/16/2022, ultrasound paracentesis dated  10/14/2022 FINDINGS: Interval reaccumulation of large volume ascites, similar to slightly larger volume compared to preprocedural images on 10/14/2022. IMPRESSION: Interval reaccumulation of large volume ascites, similar to slightly larger volume compared to preprocedural images on 10/14/2022. Electronically Signed   By: Darrin Nipper M.D.   On: 10/20/2022 14:53    atorvastatin  10 mg Oral QPM   ciprofloxacin  500 mg Oral Q breakfast   furosemide  80 mg Intravenous BID   lactulose  20 g Oral Daily   midodrine  10 mg Oral TID WC   nystatin   Topical BID   polyethylene glycol  17 g Oral BID   sertraline  50 mg Oral Daily    BMET    Component Value Date/Time   NA 139 10/22/2022 0430   K 3.4 (L) 10/22/2022 0430   CL 110 10/22/2022 0430   CO2 19 (L) 10/22/2022 0430   GLUCOSE 110 (H) 10/22/2022 0430   BUN 57 (H) 10/22/2022 0430   CREATININE 2.45 (H) 10/22/2022 0430   CALCIUM 8.1 (L) 10/22/2022 0430   GFRNONAA 21 (L) 10/22/2022 0430   GFRAA >60 03/04/2018 1449   CBC    Component Value Date/Time   WBC 3.0 (L) 10/22/2022 0544   RBC 2.94 (L) 10/22/2022 0544   HGB 7.3 (L) 10/22/2022 0544   HCT 23.4 (L) 10/22/2022 0544   PLT 76 (L) 10/22/2022 0544   MCV 79.6 (L) 10/22/2022 0544   MCH 24.8 (L) 10/22/2022 0544   MCHC 31.2 10/22/2022 0544   RDW 22.3 (H) 10/22/2022 0544   LYMPHSABS 0.6 (L) 10/15/2022 0759   MONOABS 0.3 10/15/2022 0759   EOSABS 0.3 10/15/2022 0759   BASOSABS 0.0 10/15/2022 0759     Assessment/Plan:  AKI/CKD stage IIIb- Likely hemodynamically mediated in setting of cirrhosis with frequent large volume paracenteses and diuresis with low bp.  Scr peaked at 4.3 on 2/28.  Diuretics held but resumed yesterday and had another paracentesis of 7 liters.  Thankfully BUN/Cr continue to improve.  Continue with midodrine and furosemide 80 mg IV bid.  Will check UA and Urine NA today.   Avoid nephrotoxic medications including NSAIDs and iodinated intravenous contrast exposure unless the  latter is absolutely indicated.   Preferred narcotic agents for pain control are hydromorphone, fentanyl, and methadone. Morphine should not be used.  Avoid Baclofen and avoid oral sodium phosphate and magnesium citrate based laxatives / bowel preps.  Continue strict Input and Output monitoring.  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  Continue with IV lasix 80 mg bid. Anemia - s/p transfusion.  She declined ESA Pancytopenia - due to cirrhosis. Hypotension - on midodrine 10 mg tid. Hypokalemia - replete orally and follow OSA on CPAP  Donetta Potts, MD [facility]

## 2022-10-22 NOTE — Progress Notes (Signed)
Mobility Specialist Progress Note:    10/22/22 0954  Mobility  Activity Transferred from bed to chair  Level of Assistance Standby assist, set-up cues, supervision of patient - no hands on  Assistive Device Front wheel walker  Distance Ambulated (ft) 12 ft  Activity Response Tolerated well  Mobility Referral Yes  $Mobility charge 1 Mobility   Pt was agreeable to mobility session. Tolerated well, asx throughout. Left pt in chair in care of Nts, all needs met.   Royetta Crochet Mobility Specialist Please contact via Solicitor or  Rehab office at [phone]

## 2022-10-22 NOTE — Progress Notes (Signed)
PROGRESS NOTE  Rachel Huber [id number] DOB: 08-23-1955 DOA: 10/15/2022 PCP: [name], MD  Brief History:  67 year old female with history of [name] cirrhosis, end-stage on liver transplant consideration with due, OSA on CPAP, B12 and vitamin D deficiency, anxiety depression, chronic anemia, multiple PRBC transfusions, chronic hemorrhoids, insomnia, 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  She had a PICC line placed and this has subsequently been removed since completion of IV antibiotics.  Patient is status post TIPS at [facility] on 08/20/2022.  And she is status post embolization of hemorrhoids 09/05/2022.  Patient is followed closely by gastroenterology and hepatology liver transplant at [facility].  She reportedly had last transfusion 10/09/2022.  She has chronic swelling in the legs and had been on diuretics but she was recently taken off all diuretics around 09/24/2022.  Around that time she was noted to have a small bump in creatinine.  She has had subsequent blood work done that shows that her creatinine is continue to climb and now up to greater than 4.  She does not seem to have symptoms for this now.  Her hematology clinic informed her about the abnormal labs and requested that she come into the hospital for further management. Nephrology was consulted to assist with management.  Her diuretics were initially held and subequently re-introduced.  Her renal function has gradually improved.   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 -Patient is followed closely by [facility] -Patient planning to have teeth removed as a final requirement for liver transplant consideration -Status post recent paracentesis 10/14/2022 where 8 L of fluid removed -10/21/22 paracentesis--7L -ammonia level elevated, ordered lactulose treatments but pt refuses to take in hospital  -mentation stable -IV lasix started on 10/21/22    Chronic anemia -Secondary to thalassemia disease, managed by hematologist -Secondary to chronic kidney disease -pt declined ESA   Pancytopenia -Secondary chronic liver disease -No active bleeding complications at this time -Follow CBC intermittently   OSA on CPAP -offer nightly CPAP while in hospital  Hypotension -on midodrine  Hypokalemia -judicious repletion      Family Communication:  no Family at bedside  Consultants:  renal  Code Status:  FULL   DVT Prophylaxis:  SCDs  Procedures: As Listed in Progress Note Above  Antibiotics: Cipro>>SBP prophylaxis    Subjective: Patient denies fevers, chills, headache, chest pain, dyspnea, nausea, vomiting, diarrhea, abdominal pain, dysuria, hematuria, hematochezia, and melena.   Objective: Vitals:   10/21/22 1938 10/22/22 0340 10/22/22 0700 10/22/22 1453  BP: (!) 121/46 (!) 116/43  (!) 118/49  Pulse: 71 70  61  Resp: '12 14  12  '$ Temp: 98.5 F (36.9 C) 97.9 F (36.6 C)  97.9 F (36.6 C)  TempSrc: Oral Oral  SpO2: 98% 97%  95%  Weight:   114.4 kg   Height:        Intake/Output Summary (Last 24 hours) at 10/22/2022 1723 Last data filed at  10/22/2022 1200 Gross per 24 hour  Intake 480 ml  Output 2350 ml  Net -1870 ml   Weight change: -18.4 kg Exam:  General:  Pt is alert, follows commands appropriately, not in acute distress HEENT: No icterus, No thrush, No neck mass, Bell/AT Cardiovascular: RRR, S1/S2, no rubs, no gallops Respiratory: fine bibasilar craackles. Now wheeze Abdomen: Soft/+BS, non tender, non distended, no guarding Extremities: 1 + LE edema, No lymphangitis, No petechiae, No rashes, no synovitis   Data Reviewed: I have personally reviewed following labs and imaging studies Basic Metabolic Panel: Recent Labs  Lab 10/16/22 0430 10/17/22 0429 10/17/22 0430 10/18/22 0436 10/19/22 0504 10/20/22 0440 10/21/22 0411 10/22/22 0430  NA 137  --    < > 140 138 138 140 139  K 4.0  --    < > 4.1 3.8 4.0 3.9 3.4*  CL 110  --    < > 110 109 109 109 110  CO2 19*  --    < > 19* 20* 19* 20* 19*  GLUCOSE 109*  --    < > 92 140* 133* 121* 110*  BUN 67*  --    < > 67* 69* 64* 61* 57*  CREATININE 4.09*  --    < > 3.61* 3.40* 3.01* 2.76* 2.45*  CALCIUM 7.8*  --    < > 8.6* 8.3* 8.4* 8.5* 8.1*  MG 2.0 2.0  --   --   --   --   --   --   PHOS 6.6*  --    < > 6.0* 5.1* 4.5 4.6 4.5   < > = values in this interval not displayed.   Liver Function Tests: Recent Labs  Lab 10/17/22 0429 10/17/22 0430 10/18/22 0436 10/19/22 0504 10/20/22 0440 10/21/22 0411 10/22/22 0430  AST 47*  --   --   --   --   --   --   ALT 8  --   --   --   --   --   --   ALKPHOS 79  --   --   --   --   --   --   BILITOT 1.5*  --   --   --   --   --   --   PROT 4.8*  --   --   --   --   --   --   ALBUMIN 2.8*   < > 3.4* 3.2* 3.1* 2.9* 2.7*   < > = values in this interval not displayed.   No results for input(s): "LIPASE", "AMYLASE" in the last 168 hours. Recent Labs  Lab 10/17/22 0429 10/18/22 0436  AMMONIA 95* 72*   Coagulation Profile: No results for input(s): "INR", "PROTIME" in the last 168 hours. CBC: Recent Labs  Lab  10/16/22 0430 10/17/22 0429 10/18/22 0436 10/22/22 0544  WBC 2.6* 2.8* 2.9* 3.0*  HGB 7.3* 7.0* 7.0* 7.3*  HCT 23.4* 22.4* 22.3* 23.4*  MCV 80.7 81.2 81.1 79.6*  PLT 68* 62* 60* 76*   Cardiac Enzymes: No results for input(s): "CKTOTAL", "CKMB", "CKMBINDEX", "TROPONINI" in the last 168 hours. BNP: Invalid input(s): "POCBNP" CBG: No results for input(s): "GLUCAP" in the last 168 hours. HbA1C: No results for input(s): "HGBA1C" in the last 72 hours. Urine analysis:  Component Value Date/Time   COLORURINE STRAW (A) 10/22/2022 1345   APPEARANCEUR CLEAR 10/22/2022 1345   LABSPEC 1.006 10/22/2022 1345   PHURINE 5.0 10/22/2022 1345   GLUCOSEU NEGATIVE 10/22/2022 1345   HGBUR SMALL (A) 10/22/2022 1345   BILIRUBINUR NEGATIVE 10/22/2022 1345   KETONESUR NEGATIVE 10/22/2022 1345   PROTEINUR NEGATIVE 10/22/2022 1345   NITRITE NEGATIVE 10/22/2022 1345   LEUKOCYTESUR NEGATIVE 10/22/2022 1345   Sepsis Labs: '@LABRCNTIP'$ (procalcitonin:4,lacticidven:4) )No results found for this or any previous visit (from the past 240 hour(s)).   Scheduled Meds:  atorvastatin  10 mg Oral QPM   ciprofloxacin  500 mg Oral Q breakfast   furosemide  80 mg Intravenous BID   lactulose  20 g Oral Daily   midodrine  10 mg Oral TID WC   nystatin   Topical BID   polyethylene glycol  17 g Oral BID   sertraline  50 mg Oral Daily   Continuous Infusions:  albumin human 25 g (10/21/22 0916)    Procedures/Studies: US Paracentesis  Result Date: 10/21/2022 INDICATION: Ascites. EXAM: ULTRASOUND GUIDED  PARACENTESIS MEDICATIONS: None. COMPLICATIONS: None immediate. PROCEDURE: Informed written consent was obtained from the patient after a discussion of the risks, benefits and alternatives to treatment. A timeout was performed prior to the initiation of the procedure. Initial ultrasound scanning demonstrates a large amount of ascites within the right lower abdominal quadrant. The right lower abdomen was prepped and  draped in the usual sterile fashion. 1% lidocaine was used for local anesthesia. Following this, a centesis catheter was introduced. The paracentesis was performed. The catheter was removed and a dressing was applied. The patient tolerated the procedure well without immediate post procedural complication. FINDINGS: A total of approximately 7 L of straw-colored fluid was removed. IMPRESSION: Successful ultrasound-guided paracentesis yielding 7 liters of peritoneal fluid. Electronically Signed   By: Franki Cabot M.D.   On: 10/21/2022 10:58   [place] ASCITES (ABDOMEN LIMITED)  Result Date: 10/20/2022 CLINICAL DATA:  History cirrhosis for evaluation of ascites status post paracentesis on 10/14/2018 EXAM: LIMITED ABDOMEN ULTRASOUND FOR ASCITES TECHNIQUE: Limited ultrasound survey for ascites was performed in all four abdominal quadrants. COMPARISON:  Renal ultrasound dated 10/16/2022, ultrasound paracentesis dated 10/14/2022 FINDINGS: Interval reaccumulation of large volume ascites, similar to slightly larger volume compared to preprocedural images on 10/14/2022. IMPRESSION: Interval reaccumulation of large volume ascites, similar to slightly larger volume compared to preprocedural images on 10/14/2022. Electronically Signed   By: Darrin Nipper M.D.   On: 10/20/2022 14:53   US RENAL  Result Date: 10/16/2022 CLINICAL DATA:  Impaired renal function EXAM: RENAL / URINARY TRACT ULTRASOUND COMPLETE COMPARISON:  None Available. FINDINGS: Right Kidney: Length: 13.0 cm. Echogenicity within normal limits. No mass or hydronephrosis visualized. Left Kidney: Length: 11.4 cm. Echogenicity within normal limits. No mass or hydronephrosis visualized. Bladder: Appears normal for degree of bladder distention. Other: Note is made of large ascites in the lower abdomen. IMPRESSION: Unremarkable examination of the kidneys. Large ascites noted incidentally. Electronically Signed   By: Sammie Bench M.D.   On: 10/16/2022 10:23   US  Paracentesis  Result Date: 10/14/2022 INDICATION: Refractory ascites in patient with history of NASH cirrhosis status post TIPS procedure May 2023 and TIPS revision July 2023 at [facility]. She recently underwent second TIPS revision 08/20/2022. Request received for therapeutic paracentesis with 8 L max. EXAM: ULTRASOUND GUIDED THERAPEUTIC RIGHT UPPER QUADRANT PARACENTESIS MEDICATIONS: 10 mL 1 % lidocaine COMPLICATIONS: None immediate. PROCEDURE: Informed written consent was obtained from the patient  after a discussion of the risks, benefits and alternatives to treatment. A timeout was performed prior to the initiation of the procedure. Initial ultrasound scanning demonstrates a moderate amount of ascites within the right upper abdominal quadrant. The right upper abdomen was prepped and draped in the usual sterile fashion. 1% lidocaine was used for local anesthesia. Following this, a 19 gauge, 10-cm, Yueh catheter was introduced. An ultrasound image was saved for documentation purposes. The paracentesis was performed. The catheter was removed and a dressing was applied. The patient tolerated the procedure well without immediate post procedural complication. Patient received post-procedure intravenous albumin; see nursing notes for details. FINDINGS: A total of approximately 8 L of clear, yellow fluid was removed. IMPRESSION: Successful ultrasound-guided paracentesis yielding 8 liters of peritoneal fluid. Read by: Narda Rutherford, AGNP-BC Electronically Signed   By: Ruthann Cancer M.D.   On: 10/14/2022 13:49   IR Radiologist Eval & Mgmt  Result Date: 10/13/2022 EXAM: ESTABLISHED PATIENT OFFICE VISIT CHIEF COMPLAINT: See Epic note. HISTORY OF PRESENT ILLNESS: See Epic note. REVIEW OF SYSTEMS: See Epic note. PHYSICAL EXAMINATION: See Epic note. ASSESSMENT AND PLAN: See Epic note. Ruthann Cancer, MD [facility] Electronically Signed   By: Ruthann Cancer M.D.   On:  10/13/2022 13:43   US Paracentesis  Result Date: 10/08/2022 INDICATION: Refractory ascites in patient with history of NASH cirrhosis status post TIPS procedure May 2023 and TIPS revision July 2023 at [facility]. She recently underwent a second TIPS revision 08/20/2022. Request received for therapeutic paracentesis. EXAM: ULTRASOUND GUIDED therapeutic left lower quadrant PARACENTESIS MEDICATIONS: 10 cc 1% lidocaine COMPLICATIONS: None immediate. PROCEDURE: Informed written consent was obtained from the patient after a discussion of the risks, benefits and alternatives to treatment. A timeout was performed prior to the initiation of the procedure. Initial ultrasound scanning demonstrates a large amount of ascites within the left lower abdominal quadrant. The left lower abdomen was prepped and draped in the usual sterile fashion. 1% lidocaine was used for local anesthesia. Following this, a 19 gauge, 10-cm, Yueh catheter was introduced. An ultrasound image was saved for documentation purposes. The paracentesis was performed. The catheter was removed and a dressing was applied. The patient tolerated the procedure well without immediate post procedural complication. Patient received post-procedure intravenous albumin; see nursing notes for details. FINDINGS: A total of approximately 6 L of yellow fluid was removed. IMPRESSION: Successful ultrasound-guided paracentesis yielding 6 liters of peritoneal fluid. PLAN: Patient has management of TIPS done by [facility] hospital. Read by: Reatha Armour, PA-C Electronically Signed   By: Miachel Roux M.D.   On: 10/08/2022 12:20   US Paracentesis  Result Date: 09/30/2022 INDICATION: Refractory ascites in patient with history of NASH cirrhosis status post TIPS procedure May 2023 and TIPS revision July 2023 at [facility]. She recently underwent a second TIPS revision 08/20/2022. Request received for therapeutic paracentesis with 8 L max. EXAM: ULTRASOUND GUIDED THERAPEUTIC RIGHT UPPER QUADRANT  PARACENTESIS MEDICATIONS: 10 mL 1 % lidocaine COMPLICATIONS: None immediate. PROCEDURE: Informed written consent was obtained from the patient after a discussion of the risks, benefits and alternatives to treatment. A timeout was performed prior to the initiation of the procedure. Initial ultrasound scanning demonstrates a moderate amount of ascites within the right upper abdominal quadrant. The right upper abdomen was prepped and draped in the usual sterile fashion. 1% lidocaine was used for local anesthesia. Following this, a 19 gauge, 10-cm, Yueh catheter was introduced. An ultrasound image was saved for documentation purposes.  The paracentesis was performed. The catheter was removed and a dressing was applied. The patient tolerated the procedure well without immediate post procedural complication. Patient received post-procedure intravenous albumin; see nursing notes for details. FINDINGS: A total of approximately 8 L of clear, yellow fluid was removed. IMPRESSION: Successful ultrasound-guided paracentesis yielding 8 liters of peritoneal fluid. Read by: Narda Rutherford, AGNP-BC Electronically Signed   By: Albin Felling M.D.   On: 09/30/2022 13:42   ECHO TEE  Result Date: 09/23/2022    TRANSESOPHOGEAL ECHO REPORT   Patient Name:   Iantha A Sutter Date of Exam: 09/23/2022 Medical Rec #:  [id number]      Height:       68.0 in Accession #:    [id number]     Weight:       315.9 lb Date of Birth:  March 16, 1956       BSA:          2.482 m Patient Age:    43 years       BP:           118/49 mmHg Patient Gender: F              HR:           78 bpm. Exam Location:  [facility] Procedure: Transesophageal Echo, Cardiac Doppler and Color Doppler Indications:    Bacteremia R78.81  History:        Patient has prior history of Echocardiogram examinations, most                 recent 10/11/2022. Risk Factors:Hypertension, Diabetes and                 Dyslipidemia. Thalassemia minor, anemia in CKD, cirrhosis NASH.                 OSA on  CPAP.  Sonographer:    Alvino Chapel RCS Referring Phys: [phone] [place]: The transesophogeal probe was passed without difficulty through the esophogus of the patient. Sedation performed by different physician. Image quality was excellent. The patient developed no complications during the procedure.  IMPRESSIONS  1. Left ventricular ejection fraction, by estimation, is 60 to 65%. The left ventricle has normal function.  2. Right ventricular systolic function is normal. The right ventricular size is normal.  3. Left atrial size was mildly dilated. No left atrial/left atrial appendage thrombus was detected. The LAA emptying velocity was 82 cm/s.  4. Right atrial size was mildly dilated.  5. Moderate mitral regurgitation, MR vena contracta is 0.4 cm. . The mitral valve is abnormal. Mild to moderate mitral valve regurgitation. No evidence of mitral stenosis.  6. The tricuspid valve is abnormal.  7. The aortic valve is tricuspid. Aortic valve regurgitation is not visualized. No aortic stenosis is present. Conclusion(s)/Recommendation(s): No evidence of vegetation/infective endocarditis on this transesophageael echocardiogram. FINDINGS  Left Ventricle: Left ventricular ejection fraction, by estimation, is 60 to 65%. The left ventricle has normal function. The left ventricular internal cavity size was normal in size. Right Ventricle: The right ventricular size is normal. No increase in right ventricular wall thickness. Right ventricular systolic function is normal. Left Atrium: Left atrial size was mildly dilated. No left atrial/left atrial appendage thrombus was detected. The LAA emptying velocity was 82 cm/s. Right Atrium: Right atrial size was mildly dilated. Pericardium: There is no evidence of pericardial effusion. Mitral Valve: Moderate mitral regurgitation, MR vena contracta is 0.4 cm. The mitral valve is abnormal.  Mild to moderate mitral valve regurgitation. No evidence of mitral valve stenosis.  Tricuspid Valve: The tricuspid valve is abnormal. Tricuspid valve regurgitation is mild . No evidence of tricuspid stenosis. Aortic Valve: The aortic valve is tricuspid. Aortic valve regurgitation is not visualized. No aortic stenosis is present. Pulmonic Valve: The pulmonic valve was normal in structure. Pulmonic valve regurgitation is not visualized. No evidence of pulmonic stenosis. Aorta: The aortic root is normal in size and structure. IAS/Shunts: No atrial level shunt detected by color flow Doppler.   AORTA Ao Root diam: 3.30 cm Carlyle Dolly MD Electronically signed by Carlyle Dolly MD Signature Date/Time: 09/23/2022/12:06:40 PM    Final     Orson Eva, DO  [facility]  If 7PM-7AM, please contact night-coverage [email or web address] Password [email or web address] 10/22/2022, 5:23 PM   LOS: 6 days

## 2022-10-23 ENCOUNTER — Inpatient Hospital Stay: Payer: 59

## 2022-10-23 DIAGNOSIS — N179 Acute kidney failure, unspecified: Secondary | ICD-10-CM | POA: Diagnosis not present

## 2022-10-23 DIAGNOSIS — N1832 Chronic kidney disease, stage 3b: Secondary | ICD-10-CM | POA: Diagnosis not present

## 2022-10-23 DIAGNOSIS — K7581 Nonalcoholic steatohepatitis (NASH): Secondary | ICD-10-CM | POA: Diagnosis not present

## 2022-10-23 DIAGNOSIS — D696 Thrombocytopenia, unspecified: Secondary | ICD-10-CM | POA: Diagnosis not present

## 2022-10-23 LAB — RENAL FUNCTION PANEL
Albumin: 2.8 g/dL — ABNORMAL LOW (ref 3.5–5.0)
Anion gap: 9 (ref 5–15)
BUN: 51 mg/dL — ABNORMAL HIGH (ref 8–23)
CO2: 25 mmol/L (ref 22–32)
Calcium: 8.1 mg/dL — ABNORMAL LOW (ref 8.9–10.3)
Chloride: 106 mmol/L (ref 98–111)
Creatinine, Ser: 2.38 mg/dL — ABNORMAL HIGH (ref 0.44–1.00)
GFR, Estimated: 22 mL/min — ABNORMAL LOW (ref >60)
Glucose, Bld: 108 mg/dL — ABNORMAL HIGH (ref 70–99)
Phosphorus: 4 mg/dL (ref 2.5–4.6)
Potassium: 3 mmol/L — ABNORMAL LOW (ref 3.5–5.1)
Sodium: 140 mmol/L (ref 135–145)

## 2022-10-23 MED ORDER — TORSEMIDE 20 MG PO TABS
20.0000 mg | ORAL_TABLET | Freq: Every day | ORAL | Status: DC
  Administered 2022-10-23: 20 mg via ORAL
  Filled 2022-10-23 (×2): qty 1

## 2022-10-23 MED ORDER — POTASSIUM CHLORIDE CRYS ER 20 MEQ PO TBCR
40.0000 meq | EXTENDED_RELEASE_TABLET | Freq: Two times a day (BID) | ORAL | Status: DC
  Administered 2022-10-23: 40 meq via ORAL
  Filled 2022-10-23 (×2): qty 2

## 2022-10-23 MED ORDER — GABAPENTIN 100 MG PO CAPS
100.0000 mg | ORAL_CAPSULE | Freq: Two times a day (BID) | ORAL | Status: DC
  Administered 2022-10-23 – 2022-10-25 (×5): 100 mg via ORAL
  Filled 2022-10-23 (×5): qty 1

## 2022-10-23 MED ORDER — POTASSIUM CHLORIDE 20 MEQ PO PACK
40.0000 meq | PACK | Freq: Two times a day (BID) | ORAL | Status: DC
  Administered 2022-10-23: 40 meq via ORAL
  Filled 2022-10-23: qty 2

## 2022-10-23 MED ORDER — SPIRONOLACTONE 25 MG PO TABS
25.0000 mg | ORAL_TABLET | Freq: Every day | ORAL | Status: DC
  Administered 2022-10-23 – 2022-10-25 (×3): 25 mg via ORAL
  Filled 2022-10-23 (×3): qty 1

## 2022-10-23 NOTE — Progress Notes (Signed)
PROGRESS NOTE  Rachel Huber [id number] DOB: 04/29/1956 DOA: 10/15/2022 PCP: [name], MD  Brief History:  67 year old female with history of [name] cirrhosis, end-stage on liver transplant consideration with due, OSA on CPAP, B12 and vitamin D deficiency, anxiety depression, chronic anemia, multiple PRBC transfusions, chronic hemorrhoids, insomnia, 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  She had a PICC line placed and this has subsequently been removed since completion of IV antibiotics.  Patient is status post TIPS at [facility] on 08/20/2022.  And she is status post embolization of hemorrhoids 09/05/2022.  Patient is followed closely by gastroenterology and hepatology liver transplant at [facility].  She reportedly had last transfusion 10/09/2022.  She has chronic swelling in the legs and had been on diuretics but she was recently taken off all diuretics around 09/24/2022.  Around that time she was noted to have a small bump in creatinine.  She has had subsequent blood work done that shows that her creatinine is continue to climb and now up to greater than 4.  She does not seem to have symptoms for this now.  Her hematology clinic informed her about the abnormal labs and requested that she come into the hospital for further management. Nephrology was consulted to assist with management.  Her diuretics were initially held and subequently re-introduced.  Her renal function has gradually improved.   Assessment/Plan: AKI on CKD stage IIIb -felt to be hemodynamically mediated due to frequent LVPs -initially held diuretics -prior baseline creatinine 1.4-1.7 -serum creatinine peaked 4.31 -Patient appears to be intravascularly volume depleted  initially -Gently hydrate with IV fluid initially, now off all IV fluid -Renally dose all medications as appropriate, avoiding nephrotoxic agents -appreciate nephrology follow up -nephrology team starting IV albumin infusions with some improvement in creatinine noted intially -creatinine  continues to gradually improve -IV lasix started on 10/21/22 >>transition to po torsemide and spiro on 10/22/32   Liver cirrhosis with ascites secondary to NASH -Patient is followed closely by [place] [facility]ology -Patient planning to have teeth removed as a final requirement for liver transplant consideration -Status post recent paracentesis 10/14/2022 where 8 L of fluid removed -10/21/22 paracentesis--7L -ammonia level elevated, ordered lactulose treatments but pt refuses to take in hospital  -mentation stable -IV lasix started on 10/21/22 >>po torsemide and spiro on 3/7   Chronic anemia -Secondary to thalassemia disease, managed by hematologist -Secondary to chronic kidney disease -pt declined ESA -Hgb largely stable   Pancytopenia -Secondary chronic liver disease -No active bleeding complications at this time -Follow CBC intermittently   OSA on CPAP -offer nightly CPAP while in hospital   Hypotension -on midodrine   Hypokalemia -judicious repletion         Family Communication:  no Family at bedside   Consultants:  renal   Code Status:  FULL    DVT Prophylaxis:  SCDs   Procedures: As Listed in Progress Note Above   Antibiotics: Cipro>>SBP prophylaxis          Subjective: Patient denies fevers, chills, headache, chest pain, dyspnea, nausea, vomiting, diarrhea, abdominal pain, dysuria, hematuria, hematochezia, and melena.   Objective: Vitals:   10/22/22 0700 10/22/22 1453 10/22/22 2056 10/23/22 0443  BP:  (!) 118/49 (!) 128/44 (!) 118/41  Pulse:  61  63 69  Resp:  '12 18 18  '$ Temp:  97.9 F (36.6 C) 98.6 F (37 C) 98.2 F (36.8 C)  TempSrc:   Oral Oral  SpO2:  95% 98% 97%   Weight: 114.4 kg   115.5 kg  Height:        Intake/Output Summary (Last 24 hours) at 10/23/2022 1215 Last data filed at 10/23/2022 0354 Gross per 24 hour  Intake 480 ml  Output 650 ml  Net -170 ml   Weight change: 1.1 kg Exam:  General:  Pt is alert, follows commands appropriately, not in acute distress HEENT: No icterus, No thrush, No neck mass, /AT Cardiovascular: RRR, S1/S2, no rubs, no gallops Respiratory: CTA bilaterally, no wheezing, no crackles, no rhonchi Abdomen: Soft/+BS, non tender, non distended, no guarding Extremities: 1+LE edema, No lymphangitis, No petechiae, No rashes, no synovitis   Data Reviewed: I have personally reviewed following labs and imaging studies Basic Metabolic Panel: Recent Labs  Lab 10/17/22 0429 10/17/22 0430 10/19/22 0504 10/20/22 0440 10/21/22 0411 10/22/22 0430 10/23/22 0426  NA  --    < > 138 138 140 139 140  K  --    < > 3.8 4.0 3.9 3.4* 3.0*  CL  --    < > 109 109 109 110 106  CO2  --    < > 20* 19* 20* 19* 25  GLUCOSE  --    < > 140* 133* 121* 110* 108*  BUN  --    < > 69* 64* 61* 57* 51*  CREATININE  --    < > 3.40* 3.01* 2.76* 2.45* 2.38*  CALCIUM  --    < > 8.3* 8.4* 8.5* 8.1* 8.1*  MG 2.0  --   --   --   --   --   --   PHOS  --    < > 5.1* 4.5 4.6 4.5 4.0   < > = values in this interval not displayed.   Liver Function Tests: Recent Labs  Lab 10/17/22 0429 10/17/22 0430 10/19/22 0504 10/20/22 0440 10/21/22 0411 10/22/22 0430 10/23/22 0426  AST 47*  --   --   --   --   --   --   ALT 8  --   --   --   --   --   --   ALKPHOS 79  --   --   --   --   --   --   BILITOT 1.5*  --   --   --   --   --   --   PROT 4.8*  --   --   --   --   --   --   ALBUMIN 2.8*   < > 3.2* 3.1* 2.9* 2.7* 2.8*   < > = values in this interval not displayed.   No results for input(s): "LIPASE", "AMYLASE" in the last 168 hours. Recent Labs  Lab 10/17/22 0429 10/18/22 0436  AMMONIA 95* 72*   Coagulation Profile: No results for  input(s): "INR", "PROTIME" in the last 168 hours. CBC: Recent Labs  Lab 10/17/22 0429 10/18/22 0436 10/22/22 0544  WBC 2.8* 2.9* 3.0*  HGB 7.0* 7.0* 7.3*  HCT 22.4* 22.3* 23.4*  MCV 81.2 81.1 79.6*  PLT 62* 60* 76*   Cardiac Enzymes: No results for input(s): "CKTOTAL", "CKMB", "CKMBINDEX", "TROPONINI" in the last 168 hours. BNP: Invalid input(s): "POCBNP" CBG: No results for input(s): "GLUCAP" in the last 168 hours. HbA1C:  No results for input(s): "HGBA1C" in the last 72 hours. Urine analysis:    Component Value Date/Time   COLORURINE STRAW (A) 10/22/2022 1345   APPEARANCEUR CLEAR 10/22/2022 1345   LABSPEC 1.006 10/22/2022 1345   PHURINE 5.0 10/22/2022 1345   GLUCOSEU NEGATIVE 10/22/2022 1345   HGBUR SMALL (A) 10/22/2022 1345   BILIRUBINUR NEGATIVE 10/22/2022 1345   KETONESUR NEGATIVE 10/22/2022 1345   PROTEINUR NEGATIVE 10/22/2022 1345   NITRITE NEGATIVE 10/22/2022 1345   LEUKOCYTESUR NEGATIVE 10/22/2022 1345   Sepsis Labs: '@LABRCNTIP'$ (procalcitonin:4,lacticidven:4) )No results found for this or any previous visit (from the past 240 hour(s)).   Scheduled Meds:  atorvastatin  10 mg Oral QPM   ciprofloxacin  500 mg Oral Q breakfast   gabapentin  100 mg Oral BID   lactulose  20 g Oral Daily   midodrine  10 mg Oral TID WC   nystatin   Topical BID   polyethylene glycol  17 g Oral BID   potassium chloride  40 mEq Oral BID   sertraline  50 mg Oral Daily   spironolactone  25 mg Oral Daily   torsemide  20 mg Oral Daily   Continuous Infusions:  albumin human 25 g (10/21/22 0916)    Procedures/Studies: US Paracentesis  Result Date: 10/21/2022 INDICATION: Ascites. EXAM: ULTRASOUND GUIDED  PARACENTESIS MEDICATIONS: None. COMPLICATIONS: None immediate. PROCEDURE: Informed written consent was obtained from the patient after a discussion of the risks, benefits and alternatives to treatment. A timeout was performed prior to the initiation of the procedure. Initial ultrasound  scanning demonstrates a large amount of ascites within the right lower abdominal quadrant. The right lower abdomen was prepped and draped in the usual sterile fashion. 1% lidocaine was used for local anesthesia. Following this, a centesis catheter was introduced. The paracentesis was performed. The catheter was removed and a dressing was applied. The patient tolerated the procedure well without immediate post procedural complication. FINDINGS: A total of approximately 7 L of straw-colored fluid was removed. IMPRESSION: Successful ultrasound-guided paracentesis yielding 7 liters of peritoneal fluid. Electronically Signed   By: Franki Cabot M.D.   On: 10/21/2022 10:58   [place] ASCITES (ABDOMEN LIMITED)  Result Date: 10/20/2022 CLINICAL DATA:  History cirrhosis for evaluation of ascites status post paracentesis on 10/14/2018 EXAM: LIMITED ABDOMEN ULTRASOUND FOR ASCITES TECHNIQUE: Limited ultrasound survey for ascites was performed in all four abdominal quadrants. COMPARISON:  Renal ultrasound dated 10/16/2022, ultrasound paracentesis dated 10/14/2022 FINDINGS: Interval reaccumulation of large volume ascites, similar to slightly larger volume compared to preprocedural images on 10/14/2022. IMPRESSION: Interval reaccumulation of large volume ascites, similar to slightly larger volume compared to preprocedural images on 10/14/2022. Electronically Signed   By: Darrin Nipper M.D.   On: 10/20/2022 14:53   US RENAL  Result Date: 10/16/2022 CLINICAL DATA:  Impaired renal function EXAM: RENAL / URINARY TRACT ULTRASOUND COMPLETE COMPARISON:  None Available. FINDINGS: Right Kidney: Length: 13.0 cm. Echogenicity within normal limits. No mass or hydronephrosis visualized. Left Kidney: Length: 11.4 cm. Echogenicity within normal limits. No mass or hydronephrosis visualized. Bladder: Appears normal for degree of bladder distention. Other: Note is made of large ascites in the lower abdomen. IMPRESSION: Unremarkable examination of the  kidneys. Large ascites noted incidentally. Electronically Signed   By: Sammie Bench M.D.   On: 10/16/2022 10:23   US Paracentesis  Result Date: 10/14/2022 INDICATION: Refractory ascites in patient with history of NASH cirrhosis status post TIPS procedure May 2023 and TIPS revision July 2023 at [facility]. She recently  underwent second TIPS revision 08/20/2022. Request received for therapeutic paracentesis with 8 L max. EXAM: ULTRASOUND GUIDED THERAPEUTIC RIGHT UPPER QUADRANT PARACENTESIS MEDICATIONS: 10 mL 1 % lidocaine COMPLICATIONS: None immediate. PROCEDURE: Informed written consent was obtained from the patient after a discussion of the risks, benefits and alternatives to treatment. A timeout was performed prior to the initiation of the procedure. Initial ultrasound scanning demonstrates a moderate amount of ascites within the right upper abdominal quadrant. The right upper abdomen was prepped and draped in the usual sterile fashion. 1% lidocaine was used for local anesthesia. Following this, a 19 gauge, 10-cm, Yueh catheter was introduced. An ultrasound image was saved for documentation purposes. The paracentesis was performed. The catheter was removed and a dressing was applied. The patient tolerated the procedure well without immediate post procedural complication. Patient received post-procedure intravenous albumin; see nursing notes for details. FINDINGS: A total of approximately 8 L of clear, yellow fluid was removed. IMPRESSION: Successful ultrasound-guided paracentesis yielding 8 liters of peritoneal fluid. Read by: Narda Rutherford, AGNP-BC Electronically Signed   By: Ruthann Cancer M.D.   On: 10/14/2022 13:49   IR Radiologist Eval & Mgmt  Result Date: 10/13/2022 EXAM: ESTABLISHED PATIENT OFFICE VISIT CHIEF COMPLAINT: See Epic note. HISTORY OF PRESENT ILLNESS: See Epic note. REVIEW OF SYSTEMS: See Epic note. PHYSICAL EXAMINATION: See Epic note. ASSESSMENT AND PLAN: See Epic note. Ruthann Cancer, MD  [facility] Electronically Signed   By: Ruthann Cancer M.D.   On: 10/13/2022 13:43   US Paracentesis  Result Date: 10/08/2022 INDICATION: Refractory ascites in patient with history of NASH cirrhosis status post TIPS procedure May 2023 and TIPS revision July 2023 at [facility]. She recently underwent a second TIPS revision 08/20/2022. Request received for therapeutic paracentesis. EXAM: ULTRASOUND GUIDED therapeutic left lower quadrant PARACENTESIS MEDICATIONS: 10 cc 1% lidocaine COMPLICATIONS: None immediate. PROCEDURE: Informed written consent was obtained from the patient after a discussion of the risks, benefits and alternatives to treatment. A timeout was performed prior to the initiation of the procedure. Initial ultrasound scanning demonstrates a large amount of ascites within the left lower abdominal quadrant. The left lower abdomen was prepped and draped in the usual sterile fashion. 1% lidocaine was used for local anesthesia. Following this, a 19 gauge, 10-cm, Yueh catheter was introduced. An ultrasound image was saved for documentation purposes. The paracentesis was performed. The catheter was removed and a dressing was applied. The patient tolerated the procedure well without immediate post procedural complication. Patient received post-procedure intravenous albumin; see nursing notes for details. FINDINGS: A total of approximately 6 L of yellow fluid was removed. IMPRESSION: Successful ultrasound-guided paracentesis yielding 6 liters of peritoneal fluid. PLAN: Patient has management of TIPS done by [facility] hospital. Read by: Reatha Armour, PA-C Electronically Signed   By: Miachel Roux M.D.   On: 10/08/2022 12:20   US Paracentesis  Result Date: 09/30/2022 INDICATION: Refractory ascites in patient with history of NASH cirrhosis status post TIPS procedure May 2023 and TIPS revision July 2023 at [facility]. She recently underwent a second TIPS revision  08/20/2022. Request received for therapeutic paracentesis with 8 L max. EXAM: ULTRASOUND GUIDED THERAPEUTIC RIGHT UPPER QUADRANT PARACENTESIS MEDICATIONS: 10 mL 1 % lidocaine COMPLICATIONS: None immediate. PROCEDURE: Informed written consent was obtained from the patient after a discussion of the risks, benefits and alternatives to treatment. A timeout was performed prior to the initiation of the procedure. Initial ultrasound scanning demonstrates a moderate amount of ascites within the right upper  abdominal quadrant. The right upper abdomen was prepped and draped in the usual sterile fashion. 1% lidocaine was used for local anesthesia. Following this, a 19 gauge, 10-cm, Yueh catheter was introduced. An ultrasound image was saved for documentation purposes. The paracentesis was performed. The catheter was removed and a dressing was applied. The patient tolerated the procedure well without immediate post procedural complication. Patient received post-procedure intravenous albumin; see nursing notes for details. FINDINGS: A total of approximately 8 L of clear, yellow fluid was removed. IMPRESSION: Successful ultrasound-guided paracentesis yielding 8 liters of peritoneal fluid. Read by: Narda Rutherford, AGNP-BC Electronically Signed   By: Albin Felling M.D.   On: 09/30/2022 13:42    Orson Eva, DO  [facility]  If 7PM-7AM, please contact night-coverage [email or web address] Password [email or web address] 10/23/2022, 12:15 PM   LOS: 7 days

## 2022-10-23 NOTE — Plan of Care (Signed)

## 2022-10-23 NOTE — Progress Notes (Signed)
Patient ID: Rachel Huber, female   DOB: [birth year], 67 y.o.   MRN: [id number] S: Complaining of leg pain O:BP (!) 118/41 (BP Location: Left Arm)   Pulse 69   Temp 98.2 F (36.8 C) (Oral)   Resp 18   Ht '5\' 7"'$  (1.702 m)   Wt 115.5 kg   SpO2 97%   BMI 39.88 kg/m   Intake/Output Summary (Last 24 hours) at 10/23/2022 1016 Last data filed at 10/23/2022 0354 Gross per 24 hour  Intake 720 ml  Output 650 ml  Net 70 ml   Intake/Output: I/O last 3 completed shifts: In: 960 [P.O.:960] Out: 2300 [Urine:2300]  Intake/Output this shift:  No intake/output data recorded. Weight change: 1.1 kg Gen: NAD CVS: RRR Resp: decreased BS at bases Abd: obese, +BS, soft, NT/ND Ext: 1+ brawny edema of b/l lower extremities, 2+ presacral edema  Recent Labs  Lab 10/17/22 0429 10/17/22 0430 10/18/22 0436 10/19/22 0504 10/20/22 0440 10/21/22 0411 10/22/22 0430 10/23/22 0426  NA  --  138 140 138 138 140 139 140  K  --  3.9 4.1 3.8 4.0 3.9 3.4* 3.0*  CL  --  112* 110 109 109 109 110 106  CO2  --  18* 19* 20* 19* 20* 19* 25  GLUCOSE  --  90 92 140* 133* 121* 110* 108*  BUN  --  70* 67* 69* 64* 61* 57* 51*  CREATININE  --  3.74* 3.61* 3.40* 3.01* 2.76* 2.45* 2.38*  ALBUMIN 2.8* 2.9* 3.4* 3.2* 3.1* 2.9* 2.7* 2.8*  CALCIUM  --  7.8* 8.6* 8.3* 8.4* 8.5* 8.1* 8.1*  PHOS  --  6.1* 6.0* 5.1* 4.5 4.6 4.5 4.0  AST 47*  --   --   --   --   --   --   --   ALT 8  --   --   --   --   --   --   --    Liver Function Tests: Recent Labs  Lab 10/17/22 0429 10/17/22 0430 10/21/22 0411 10/22/22 0430 10/23/22 0426  AST 47*  --   --   --   --   ALT 8  --   --   --   --   ALKPHOS 79  --   --   --   --   BILITOT 1.5*  --   --   --   --   PROT 4.8*  --   --   --   --   ALBUMIN 2.8*   < > 2.9* 2.7* 2.8*   < > = values in this interval not displayed.   No results for input(s): "LIPASE", "AMYLASE" in the last 168 hours. Recent Labs  Lab 10/17/22 0429 10/18/22 0436  AMMONIA 95* 72*   CBC: Recent Labs  Lab  10/17/22 0429 10/18/22 0436 10/22/22 0544  WBC 2.8* 2.9* 3.0*  HGB 7.0* 7.0* 7.3*  HCT 22.4* 22.3* 23.4*  MCV 81.2 81.1 79.6*  PLT 62* 60* 76*   Cardiac Enzymes: No results for input(s): "CKTOTAL", "CKMB", "CKMBINDEX", "TROPONINI" in the last 168 hours. CBG: No results for input(s): "GLUCAP" in the last 168 hours.  Iron Studies: No results for input(s): "IRON", "TIBC", "TRANSFERRIN", "FERRITIN" in the last 72 hours. Studies/Results: US Paracentesis  Result Date: 10/21/2022 INDICATION: Ascites. EXAM: ULTRASOUND GUIDED  PARACENTESIS MEDICATIONS: None. COMPLICATIONS: None immediate. PROCEDURE: Informed written consent was obtained from the patient after a discussion of the risks, benefits and alternatives to treatment. A timeout  was performed prior to the initiation of the procedure. Initial ultrasound scanning demonstrates a large amount of ascites within the right lower abdominal quadrant. The right lower abdomen was prepped and draped in the usual sterile fashion. 1% lidocaine was used for local anesthesia. Following this, a centesis catheter was introduced. The paracentesis was performed. The catheter was removed and a dressing was applied. The patient tolerated the procedure well without immediate post procedural complication. FINDINGS: A total of approximately 7 L of straw-colored fluid was removed. IMPRESSION: Successful ultrasound-guided paracentesis yielding 7 liters of peritoneal fluid. Electronically Signed   By: Franki Cabot M.D.   On: 10/21/2022 10:58    atorvastatin  10 mg Oral QPM   ciprofloxacin  500 mg Oral Q breakfast   furosemide  80 mg Intravenous BID   lactulose  20 g Oral Daily   midodrine  10 mg Oral TID WC   nystatin   Topical BID   polyethylene glycol  17 g Oral BID   sertraline  50 mg Oral Daily    BMET    Component Value Date/Time   NA 140 10/23/2022 0426   K 3.0 (L) 10/23/2022 0426   CL 106 10/23/2022 0426   CO2 25 10/23/2022 0426   GLUCOSE 108 (H)  10/23/2022 0426   BUN 51 (H) 10/23/2022 0426   CREATININE 2.38 (H) 10/23/2022 0426   CALCIUM 8.1 (L) 10/23/2022 0426   GFRNONAA 22 (L) 10/23/2022 0426   GFRAA >60 03/04/2018 1449   CBC    Component Value Date/Time   WBC 3.0 (L) 10/22/2022 0544   RBC 2.94 (L) 10/22/2022 0544   HGB 7.3 (L) 10/22/2022 0544   HCT 23.4 (L) 10/22/2022 0544   PLT 76 (L) 10/22/2022 0544   MCV 79.6 (L) 10/22/2022 0544   MCH 24.8 (L) 10/22/2022 0544   MCHC 31.2 10/22/2022 0544   RDW 22.3 (H) 10/22/2022 0544   LYMPHSABS 0.6 (L) 10/15/2022 0759   MONOABS 0.3 10/15/2022 0759   EOSABS 0.3 10/15/2022 0759   BASOSABS 0.0 10/15/2022 0759    Assessment/Plan:   AKI/CKD stage IIIb- Likely hemodynamically mediated in setting of cirrhosis with frequent large volume paracenteses and diuresis with low bp.  Scr peaked at 4.3 on 2/28.  Diuretics held but resumed yesterday and had another paracentesis of 7 liters.  Thankfully BUN/Cr continue to improve.  Continue with midodrine and will try po spironolactone and torsemide (see below) today to see if she still diureses without worsening renal function.    Avoid nephrotoxic medications including NSAIDs and iodinated intravenous contrast exposure unless the latter is absolutely indicated.   Preferred narcotic agents for pain control are hydromorphone, fentanyl, and methadone. Morphine should not be used.  Avoid Baclofen and avoid oral sodium phosphate and magnesium citrate based laxatives / bowel preps.  Continue strict Input and Output monitoring.  Will monitor the patient closely with you and intervene or adjust therapy as indicated by changes in clinical status/labs  Cirrhosis due to NASH with ascites and anasarca - has received IV albumin and s/p large volume paracentesis on 10/21/22.  Had IV lasix this morning.  Will start spironolactone 25 mg daily and torsemide 20 mg daily to see if she continues to diuresis and renal function improves before she can be discharged to home.   Anemia - s/p transfusion.  She declined ESA Pancytopenia - due to cirrhosis. Hypotension - on midodrine 10 mg tid. Hypokalemia - replete orally and follow OSA on CPAP Neuropathy - will resume gabapentin  but at much lower dose for renal clearance 100 mg bid.   Donetta Potts, MD [facility]

## 2022-10-24 DIAGNOSIS — D563 Thalassemia minor: Secondary | ICD-10-CM | POA: Diagnosis not present

## 2022-10-24 DIAGNOSIS — K7581 Nonalcoholic steatohepatitis (NASH): Secondary | ICD-10-CM | POA: Diagnosis not present

## 2022-10-24 DIAGNOSIS — N179 Acute kidney failure, unspecified: Secondary | ICD-10-CM | POA: Diagnosis not present

## 2022-10-24 DIAGNOSIS — N1832 Chronic kidney disease, stage 3b: Secondary | ICD-10-CM | POA: Diagnosis not present

## 2022-10-24 LAB — RENAL FUNCTION PANEL
Albumin: 2.6 g/dL — ABNORMAL LOW (ref 3.5–5.0)
Anion gap: 10 (ref 5–15)
BUN: 51 mg/dL — ABNORMAL HIGH (ref 8–23)
CO2: 25 mmol/L (ref 22–32)
Calcium: 8.1 mg/dL — ABNORMAL LOW (ref 8.9–10.3)
Chloride: 106 mmol/L (ref 98–111)
Creatinine, Ser: 2.37 mg/dL — ABNORMAL HIGH (ref 0.44–1.00)
GFR, Estimated: 22 mL/min — ABNORMAL LOW (ref >60)
Glucose, Bld: 109 mg/dL — ABNORMAL HIGH (ref 70–99)
Phosphorus: 3.7 mg/dL (ref 2.5–4.6)
Potassium: 3.1 mmol/L — ABNORMAL LOW (ref 3.5–5.1)
Sodium: 141 mmol/L (ref 135–145)

## 2022-10-24 LAB — CBC
HCT: 23 % — ABNORMAL LOW (ref 36.0–46.0)
Hemoglobin: 7 g/dL — ABNORMAL LOW (ref 12.0–15.0)
MCH: 24 pg — ABNORMAL LOW (ref 26.0–34.0)
MCHC: 30.4 g/dL (ref 30.0–36.0)
MCV: 78.8 fL — ABNORMAL LOW (ref 80.0–100.0)
Platelets: 74 10*3/uL — ABNORMAL LOW (ref 150–400)
RBC: 2.92 MIL/uL — ABNORMAL LOW (ref 3.87–5.11)
RDW: 21.7 % — ABNORMAL HIGH (ref 11.5–15.5)
WBC: 2.9 10*3/uL — ABNORMAL LOW (ref 4.0–10.5)
nRBC: 0 % (ref 0.0–0.2)

## 2022-10-24 MED ORDER — POTASSIUM CHLORIDE CRYS ER 20 MEQ PO TBCR
40.0000 meq | EXTENDED_RELEASE_TABLET | Freq: Three times a day (TID) | ORAL | Status: DC
  Administered 2022-10-24 – 2022-10-25 (×4): 40 meq via ORAL
  Filled 2022-10-24 (×3): qty 2

## 2022-10-24 MED ORDER — TORSEMIDE 20 MG PO TABS
40.0000 mg | ORAL_TABLET | Freq: Every day | ORAL | Status: DC
  Administered 2022-10-24 – 2022-10-25 (×2): 40 mg via ORAL
  Filled 2022-10-24 (×2): qty 2

## 2022-10-24 NOTE — Progress Notes (Signed)
Patient slept most of the night during this shift. Patient given 2 prm medications. No complaints of pain or discomfort. Plan of care ongoing.

## 2022-10-24 NOTE — Progress Notes (Signed)
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  She had a PICC line placed and this has subsequently been removed since completion of IV antibiotics.  Patient is status post TIPS at [facility] on 08/20/2022.  And she is status post embolization of hemorrhoids 09/05/2022.  Patient is followed closely by gastroenterology and hepatology liver transplant at [facility].  She reportedly had last transfusion 10/09/2022.  She has chronic swelling in the legs and had been on diuretics but she was recently taken off all diuretics around 09/24/2022.  Around that time she was noted to have a small bump in creatinine.  She has had subsequent blood work done that shows that her creatinine is continue to climb and now up to greater than 4.  She does not seem to have symptoms for this now.  Her hematology clinic informed her about the abnormal labs and requested that she come into the hospital for further management. Nephrology was consulted to assist with management.  Her diuretics were initially held and subequently re-introduced.  Her renal function has gradually improved.   Assessment/Plan: AKI on CKD stage IIIb -felt to be hemodynamically mediated due to frequent LVPs -initially held diuretics -prior baseline creatinine 1.4-1.7 -serum creatinine peaked 4.31 -Patient appears to be intravascularly volume depleted  initially -Gently hydrate with IV fluid initially, now off all IV fluid -Renally dose all medications as appropriate, avoiding nephrotoxic agents -appreciate nephrology follow up -nephrology team starting IV albumin infusions with some improvement in creatinine noted intially -creatinine  continues to gradually improve -IV lasix started on 10/21/22 >>transition to po torsemide and spiro on 10/22/32 3/8--torsemide increased to 40 mg daily 3/8--discussed with renal, Dr. 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 -Patient is followed closely by [place] hepatology -Patient planning to have teeth removed as a final requirement for liver transplant consideration -Status post recent paracentesis 10/14/2022 where 8 L of fluid removed -10/21/22 paracentesis--7L -ammonia level elevated, ordered lactulose treatments but pt refuses to take in hospital  -mentation stable -IV lasix started on 10/21/22 >>po torsemide and spiro on 3/7   Chronic anemia -Secondary to thalassemia disease, managed by hematologist -Secondary to chronic kidney disease -pt declined ESA -Hgb largely stable -she has appointment at [facility] cancer center on 10/29/22   Pancytopenia -Secondary chronic liver disease -No active bleeding complications at this time -Follow CBC intermittently   OSA on CPAP -offer nightly CPAP while in hospital   Hypotension -on midodrine   Hypokalemia -judicious repletion         Family Communication:  no Family at bedside   Consultants:  renal   Code Status:  FULL    DVT Prophylaxis:  SCDs   Procedures: As Listed in Progress Note Above   Antibiotics: Cipro>>SBP prophylaxis          Subjective: Patient denies fevers, chills, headache, chest pain, dyspnea, nausea, vomiting, diarrhea, abdominal pain, dysuria, hematuria, hematochezia, and melena.  Objective: Vitals:   10/23/22 1431 10/23/22 2020 10/24/22 0401 10/24/22 1405   BP: (!) 129/50 (!) 119/50 (!) 115/45 (!) 119/46  Pulse: 64 65 71 65  Resp: '16 18 18 18  '$ Temp: 98.1 F (36.7 C) 98.7 F (37.1 C) 98 F (36.7 C) 98.4 F (36.9 C)  TempSrc: Oral Oral Oral Oral  SpO2: 100% 98% 97% 97%  Weight:   115.5 kg   Height:        Intake/Output Summary (Last 24 hours) at 10/24/2022 1817 Last data filed at 10/24/2022 1030 Gross per 24 hour  Intake --  Output 700 ml  Net -700 ml   Weight change: 0 kg Exam:  General:  Pt is alert, follows commands appropriately, not in acute distress HEENT: No icterus, No thrush, No neck mass, [place]/AT Cardiovascular: RRR, S1/S2, no rubs, no gallops Respiratory: CTA bilaterally, no wheezing, no crackles, no rhonchi Abdomen: Soft/+BS, non tender, non distended, no guarding Extremities: trace LE edema, No lymphangitis, No petechiae, No rashes, no synovitis   Data Reviewed: I have personally reviewed following labs and imaging studies Basic Metabolic Panel: Recent Labs  Lab 10/20/22 0440 10/21/22 0411 10/22/22 0430 10/23/22 0426 10/24/22 0423  NA 138 140 139 140 141  K 4.0 3.9 3.4* 3.0* 3.1*  CL 109 109 110 106 106  CO2 19* 20* 19* 25 25  GLUCOSE 133* 121* 110* 108* 109*  BUN 64* 61* 57* 51* 51*  CREATININE 3.01* 2.76* 2.45* 2.38* 2.37*  CALCIUM 8.4* 8.5* 8.1* 8.1* 8.1*  PHOS 4.5 4.6 4.5 4.0 3.7   Liver Function Tests: Recent Labs  Lab 10/20/22 0440 10/21/22 0411 10/22/22 0430 10/23/22 0426 10/24/22 0423  ALBUMIN 3.1* 2.9* 2.7* 2.8* 2.6*   No results for input(s): "LIPASE", "AMYLASE" in the last 168 hours. Recent Labs  Lab 10/18/22 0436  AMMONIA 72*   Coagulation Profile: No results for input(s): "INR", "PROTIME" in the last 168 hours. CBC: Recent Labs  Lab 10/18/22 0436 10/22/22 0544 10/24/22 0423  WBC 2.9* 3.0* 2.9*  HGB 7.0* 7.3* 7.0*  HCT 22.3* 23.4* 23.0*  MCV 81.1 79.6* 78.8*  PLT 60* 76* 74*   Cardiac Enzymes: No results for input(s): "CKTOTAL", "CKMB", "CKMBINDEX", "TROPONINI" in the  last 168 hours. BNP: Invalid input(s): "POCBNP" CBG: No results for input(s): "GLUCAP" in the last 168 hours. HbA1C: No results for input(s): "HGBA1C" in the last 72 hours. Urine analysis:    Component Value Date/Time   COLORURINE STRAW (A) 10/22/2022 1345   APPEARANCEUR CLEAR 10/22/2022 1345   LABSPEC 1.006 10/22/2022 1345   PHURINE 5.0 10/22/2022 1345   GLUCOSEU NEGATIVE 10/22/2022 1345   HGBUR SMALL (A) 10/22/2022 1345   BILIRUBINUR NEGATIVE 10/22/2022 1345   KETONESUR NEGATIVE 10/22/2022 1345   PROTEINUR NEGATIVE 10/22/2022 1345   NITRITE NEGATIVE 10/22/2022 1345   LEUKOCYTESUR NEGATIVE 10/22/2022 1345   Sepsis Labs: '@LABRCNTIP'$ (procalcitonin:4,lacticidven:4) )No results found for this or any previous visit (from the past 240 hour(s)).   Scheduled Meds:  atorvastatin  10 mg Oral QPM   ciprofloxacin  500 mg Oral Q breakfast   gabapentin  100 mg Oral BID   lactulose  20 g Oral Daily   midodrine  10 mg Oral TID WC   nystatin   Topical BID   polyethylene glycol  17 g Oral BID   potassium chloride  40 mEq Oral TID   sertraline  50 mg Oral Daily   spironolactone  25 mg Oral Daily   torsemide  40 mg Oral Daily  Continuous Infusions:  albumin human 25 g (10/21/22 0916)    Procedures/Studies: US Paracentesis  Result Date: 10/21/2022 INDICATION: Ascites. EXAM: ULTRASOUND GUIDED  PARACENTESIS MEDICATIONS: None. COMPLICATIONS: None immediate. PROCEDURE: Informed written consent was obtained from the patient after a discussion of the risks, benefits and alternatives to treatment. A timeout was performed prior to the initiation of the procedure. Initial ultrasound scanning demonstrates a large amount of ascites within the right lower abdominal quadrant. The right lower abdomen was prepped and draped in the usual sterile fashion. 1% lidocaine was used for local anesthesia. Following this, a centesis catheter was introduced. The paracentesis was performed. The catheter was removed  and a dressing was applied. The patient tolerated the procedure well without immediate post procedural complication. FINDINGS: A total of approximately 7 L of straw-colored fluid was removed. IMPRESSION: Successful ultrasound-guided paracentesis yielding 7 liters of peritoneal fluid. Electronically Signed   By: Franki Cabot M.D.   On: 10/21/2022 10:58   [place] ASCITES (ABDOMEN LIMITED)  Result Date: 10/20/2022 CLINICAL DATA:  History cirrhosis for evaluation of ascites status post paracentesis on 10/14/2018 EXAM: LIMITED ABDOMEN ULTRASOUND FOR ASCITES TECHNIQUE: Limited ultrasound survey for ascites was performed in all four abdominal quadrants. COMPARISON:  Renal ultrasound dated 10/16/2022, ultrasound paracentesis dated 10/14/2022 FINDINGS: Interval reaccumulation of large volume ascites, similar to slightly larger volume compared to preprocedural images on 10/14/2022. IMPRESSION: Interval reaccumulation of large volume ascites, similar to slightly larger volume compared to preprocedural images on 10/14/2022. Electronically Signed   By: Darrin Nipper M.D.   On: 10/20/2022 14:53   US RENAL  Result Date: 10/16/2022 CLINICAL DATA:  Impaired renal function EXAM: RENAL / URINARY TRACT ULTRASOUND COMPLETE COMPARISON:  None Available. FINDINGS: Right Kidney: Length: 13.0 cm. Echogenicity within normal limits. No mass or hydronephrosis visualized. Left Kidney: Length: 11.4 cm. Echogenicity within normal limits. No mass or hydronephrosis visualized. Bladder: Appears normal for degree of bladder distention. Other: Note is made of large ascites in the lower abdomen. IMPRESSION: Unremarkable examination of the kidneys. Large ascites noted incidentally. Electronically Signed   By: Sammie Bench M.D.   On: 10/16/2022 10:23   US Paracentesis  Result Date: 10/14/2022 INDICATION: Refractory ascites in patient with history of NASH cirrhosis status post TIPS procedure May 2023 and TIPS revision July 2023 at [facility]. She recently  underwent second TIPS revision 08/20/2022. Request received for therapeutic paracentesis with 8 L max. EXAM: ULTRASOUND GUIDED THERAPEUTIC RIGHT UPPER QUADRANT PARACENTESIS MEDICATIONS: 10 mL 1 % lidocaine COMPLICATIONS: None immediate. PROCEDURE: Informed written consent was obtained from the patient after a discussion of the risks, benefits and alternatives to treatment. A timeout was performed prior to the initiation of the procedure. Initial ultrasound scanning demonstrates a moderate amount of ascites within the right upper abdominal quadrant. The right upper abdomen was prepped and draped in the usual sterile fashion. 1% lidocaine was used for local anesthesia. Following this, a 19 gauge, 10-cm, Yueh catheter was introduced. An ultrasound image was saved for documentation purposes. The paracentesis was performed. The catheter was removed and a dressing was applied. The patient tolerated the procedure well without immediate post procedural complication. Patient received post-procedure intravenous albumin; see nursing notes for details. FINDINGS: A total of approximately 8 L of clear, yellow fluid was removed. IMPRESSION: Successful ultrasound-guided paracentesis yielding 8 liters of peritoneal fluid. Read by: Narda Rutherford, AGNP-BC Electronically Signed   By: Ruthann Cancer M.D.   On: 10/14/2022 13:49   IR Radiologist Eval & Mgmt  Result Date:  10/13/2022 EXAM: ESTABLISHED PATIENT OFFICE VISIT CHIEF COMPLAINT: See Epic note. HISTORY OF PRESENT ILLNESS: See Epic note. REVIEW OF SYSTEMS: See Epic note. PHYSICAL EXAMINATION: See Epic note. ASSESSMENT AND PLAN: See Epic note. Ruthann Cancer, MD [facility] Electronically Signed   By: Ruthann Cancer M.D.   On: 10/13/2022 13:43   US Paracentesis  Result Date: 10/08/2022 INDICATION: Refractory ascites in patient with history of NASH cirrhosis status post TIPS procedure May 2023 and TIPS revision July 2023 at  [facility]. She recently underwent a second TIPS revision 08/20/2022. Request received for therapeutic paracentesis. EXAM: ULTRASOUND GUIDED therapeutic left lower quadrant PARACENTESIS MEDICATIONS: 10 cc 1% lidocaine COMPLICATIONS: None immediate. PROCEDURE: Informed written consent was obtained from the patient after a discussion of the risks, benefits and alternatives to treatment. A timeout was performed prior to the initiation of the procedure. Initial ultrasound scanning demonstrates a large amount of ascites within the left lower abdominal quadrant. The left lower abdomen was prepped and draped in the usual sterile fashion. 1% lidocaine was used for local anesthesia. Following this, a 19 gauge, 10-cm, Yueh catheter was introduced. An ultrasound image was saved for documentation purposes. The paracentesis was performed. The catheter was removed and a dressing was applied. The patient tolerated the procedure well without immediate post procedural complication. Patient received post-procedure intravenous albumin; see nursing notes for details. FINDINGS: A total of approximately 6 L of yellow fluid was removed. IMPRESSION: Successful ultrasound-guided paracentesis yielding 6 liters of peritoneal fluid. PLAN: Patient has management of TIPS done by [facility] hospital. Read by: Reatha Armour, PA-C Electronically Signed   By: Miachel Roux M.D.   On: 10/08/2022 12:20   US Paracentesis  Result Date: 09/30/2022 INDICATION: Refractory ascites in patient with history of NASH cirrhosis status post TIPS procedure May 2023 and TIPS revision July 2023 at [facility]. She recently underwent a second TIPS revision 08/20/2022. Request received for therapeutic paracentesis with 8 L max. EXAM: ULTRASOUND GUIDED THERAPEUTIC RIGHT UPPER QUADRANT PARACENTESIS MEDICATIONS: 10 mL 1 % lidocaine COMPLICATIONS: None immediate. PROCEDURE: Informed written consent was obtained from the patient after a discussion of the risks, benefits and alternatives to  treatment. A timeout was performed prior to the initiation of the procedure. Initial ultrasound scanning demonstrates a moderate amount of ascites within the right upper abdominal quadrant. The right upper abdomen was prepped and draped in the usual sterile fashion. 1% lidocaine was used for local anesthesia. Following this, a 19 gauge, 10-cm, Yueh catheter was introduced. An ultrasound image was saved for documentation purposes. The paracentesis was performed. The catheter was removed and a dressing was applied. The patient tolerated the procedure well without immediate post procedural complication. Patient received post-procedure intravenous albumin; see nursing notes for details. FINDINGS: A total of approximately 8 L of clear, yellow fluid was removed. IMPRESSION: Successful ultrasound-guided paracentesis yielding 8 liters of peritoneal fluid. Read by: Narda Rutherford, AGNP-BC Electronically Signed   By: Albin Felling M.D.   On: 09/30/2022 13:42    Orson Eva, DO  [facility]  If 7PM-7AM, please contact night-coverage [email or web address] Password [facility] 10/24/2022, 6:17 PM   LOS: 8 days

## 2022-10-24 NOTE — Progress Notes (Addendum)
Patient ID: Rachel Huber, female   DOB: 1956/02/08, 67 y.o.   MRN: [id number] S:No events overnight.  Tolerated po torsemide and spironolactone but not much in the way of UOP. O:BP (!) 115/45 (BP Location: Left Arm)   Pulse 71   Temp 98 F (36.7 C) (Oral)   Resp 18   Ht '5\' 7"'$  (1.702 m)   Wt 115.5 kg   SpO2 97%   BMI 39.88 kg/m   Intake/Output Summary (Last 24 hours) at 10/24/2022 0913 Last data filed at 10/23/2022 2100 Gross per 24 hour  Intake --  Output 300 ml  Net -300 ml   Intake/Output: I/O last 3 completed shifts: In: 240 [P.O.:240] Out: 300 [Urine:300]  Intake/Output this shift:  No intake/output data recorded. Weight change: 0 kg Gen: NAD CVS: RRR Resp: CTA Abd: obese, +BS, soft, NT/ND Ext: brawny edema BLE, 1-2+ presacral edema  Recent Labs  Lab 10/18/22 0436 10/19/22 0504 10/20/22 0440 10/21/22 0411 10/22/22 0430 10/23/22 0426 10/24/22 0423  NA 140 138 138 140 139 140 141  K 4.1 3.8 4.0 3.9 3.4* 3.0* 3.1*  CL 110 109 109 109 110 106 106  CO2 19* 20* 19* 20* 19* 25 25  GLUCOSE 92 140* 133* 121* 110* 108* 109*  BUN 67* 69* 64* 61* 57* 51* 51*  CREATININE 3.61* 3.40* 3.01* 2.76* 2.45* 2.38* 2.37*  ALBUMIN 3.4* 3.2* 3.1* 2.9* 2.7* 2.8* 2.6*  CALCIUM 8.6* 8.3* 8.4* 8.5* 8.1* 8.1* 8.1*  PHOS 6.0* 5.1* 4.5 4.6 4.5 4.0 3.7   Liver Function Tests: Recent Labs  Lab 10/22/22 0430 10/23/22 0426 10/24/22 0423  ALBUMIN 2.7* 2.8* 2.6*   No results for input(s): "LIPASE", "AMYLASE" in the last 168 hours. Recent Labs  Lab 10/18/22 0436  AMMONIA 72*   CBC: Recent Labs  Lab 10/18/22 0436 10/22/22 0544 10/24/22 0423  WBC 2.9* 3.0* 2.9*  HGB 7.0* 7.3* 7.0*  HCT 22.3* 23.4* 23.0*  MCV 81.1 79.6* 78.8*  PLT 60* 76* 74*   Cardiac Enzymes: No results for input(s): "CKTOTAL", "CKMB", "CKMBINDEX", "TROPONINI" in the last 168 hours. CBG: No results for input(s): "GLUCAP" in the last 168 hours.  Iron Studies: No results for input(s): "IRON", "TIBC",  "TRANSFERRIN", "FERRITIN" in the last 72 hours. Studies/Results: No results found.  atorvastatin  10 mg Oral QPM   ciprofloxacin  500 mg Oral Q breakfast   gabapentin  100 mg Oral BID   lactulose  20 g Oral Daily   midodrine  10 mg Oral TID WC   nystatin   Topical BID   polyethylene glycol  17 g Oral BID   potassium chloride  40 mEq Oral BID   sertraline  50 mg Oral Daily   spironolactone  25 mg Oral Daily   torsemide  20 mg Oral Daily    BMET    Component Value Date/Time   NA 141 10/24/2022 0423   K 3.1 (L) 10/24/2022 0423   CL 106 10/24/2022 0423   CO2 25 10/24/2022 0423   GLUCOSE 109 (H) 10/24/2022 0423   BUN 51 (H) 10/24/2022 0423   CREATININE 2.37 (H) 10/24/2022 0423   CALCIUM 8.1 (L) 10/24/2022 0423   GFRNONAA 22 (L) 10/24/2022 0423   GFRAA >60 03/04/2018 1449   CBC    Component Value Date/Time   WBC 2.9 (L) 10/24/2022 0423   RBC 2.92 (L) 10/24/2022 0423   HGB 7.0 (L) 10/24/2022 0423   HCT 23.0 (L) 10/24/2022 0423   PLT 74 (L) 10/24/2022  0423   MCV 78.8 (L) 10/24/2022 0423   MCH 24.0 (L) 10/24/2022 0423   MCHC 30.4 10/24/2022 0423   RDW 21.7 (H) 10/24/2022 0423   LYMPHSABS 0.6 (L) 10/15/2022 0759   MONOABS 0.3 10/15/2022 0759   EOSABS 0.3 10/15/2022 0759   BASOSABS 0.0 10/15/2022 0759    Assessment/Plan:   AKI/CKD stage IIIb- Likely hemodynamically mediated in setting of cirrhosis with frequent large volume paracenteses and diuresis with low bp.  Scr peaked at 4.3 on 2/28.  Diuretics held but resumed yesterday and had another paracentesis of 7 liters.  Thankfully BUN/Cr continue to improve.  Continue with midodrine.  Started on po spironolactone 25 mg and torsemide 20 mg but not much UOP.  Will increase torsemide to 40 mg and follow.   Avoid nephrotoxic medications including NSAIDs and iodinated intravenous contrast exposure unless the latter is absolutely indicated.   Preferred narcotic agents for pain control are hydromorphone, fentanyl, and methadone.  Morphine should not be used.  Avoid Baclofen and avoid oral sodium phosphate and magnesium citrate based laxatives / bowel preps.  Continue strict Input and Output monitoring.  Will monitor the patient closely with you and intervene or adjust therapy as indicated by changes in clinical status/labs  Cirrhosis due to NASH with ascites and anasarca - has received IV albumin and s/p large volume paracentesis on 10/21/22.  Had IV lasix this morning.  Started spironolactone 25 mg daily and torsemide 20 mg daily.  Will increase torsemide to 40 mg and see if she continues to diuresis and renal function improves before she can be discharged to home.  Anemia - s/p transfusion.  She declined ESA would likely benefit from another transfusion since Hgb is 7. Pancytopenia - due to cirrhosis. Hypotension - on midodrine 10 mg tid. Hypokalemia - replete orally and follow.  Increase to 40 mEq tid and follow. OSA on CPAP Neuropathy - will resume gabapentin but at much lower dose for renal clearance 100 mg bid.   Donetta Potts, MD [facility]  Pt will not be physically seen over the weekend, however her labs and chart will be reviewed remotely and on-call coverage is available if needed.

## 2022-10-24 NOTE — Progress Notes (Signed)
Patient alert and verbal. Patient ambulated with assistance to and from the bathroom with walker during shift. Changed IV dressing. Patient reported some complaints of pain during shift prn given, see MAR.

## 2022-10-25 DIAGNOSIS — K7581 Nonalcoholic steatohepatitis (NASH): Secondary | ICD-10-CM | POA: Diagnosis not present

## 2022-10-25 DIAGNOSIS — D563 Thalassemia minor: Secondary | ICD-10-CM | POA: Diagnosis not present

## 2022-10-25 DIAGNOSIS — N1832 Chronic kidney disease, stage 3b: Secondary | ICD-10-CM | POA: Diagnosis not present

## 2022-10-25 DIAGNOSIS — N179 Acute kidney failure, unspecified: Secondary | ICD-10-CM | POA: Diagnosis not present

## 2022-10-25 LAB — RENAL FUNCTION PANEL
Albumin: 2.8 g/dL — ABNORMAL LOW (ref 3.5–5.0)
Anion gap: 8 (ref 5–15)
BUN: 49 mg/dL — ABNORMAL HIGH (ref 8–23)
CO2: 27 mmol/L (ref 22–32)
Calcium: 8.2 mg/dL — ABNORMAL LOW (ref 8.9–10.3)
Chloride: 105 mmol/L (ref 98–111)
Creatinine, Ser: 2.32 mg/dL — ABNORMAL HIGH (ref 0.44–1.00)
GFR, Estimated: 23 mL/min — ABNORMAL LOW (ref >60)
Glucose, Bld: 103 mg/dL — ABNORMAL HIGH (ref 70–99)
Phosphorus: 3.2 mg/dL (ref 2.5–4.6)
Potassium: 4.2 mmol/L (ref 3.5–5.1)
Sodium: 140 mmol/L (ref 135–145)

## 2022-10-25 MED ORDER — POTASSIUM CHLORIDE CRYS ER 20 MEQ PO TBCR: 20.0000 meq | EXTENDED_RELEASE_TABLET | Freq: Two times a day (BID) | ORAL | Status: DC

## 2022-10-25 MED ORDER — TORSEMIDE 40 MG PO TABS: 40.0000 mg | ORAL_TABLET | Freq: Every day | ORAL | 1 refills | Status: DC

## 2022-10-25 MED ORDER — SPIRONOLACTONE 25 MG PO TABS: 25.0000 mg | ORAL_TABLET | Freq: Every day | ORAL | 1 refills | Status: DC

## 2022-10-25 MED ORDER — POTASSIUM CHLORIDE CRYS ER 20 MEQ PO TBCR: 20.0000 meq | EXTENDED_RELEASE_TABLET | Freq: Two times a day (BID) | ORAL | 0 refills | Status: DC

## 2022-10-25 MED ORDER — MIDODRINE HCL 10 MG PO TABS: 10.0000 mg | ORAL_TABLET | Freq: Three times a day (TID) | ORAL | 0 refills | Status: AC

## 2022-10-25 NOTE — Discharge Summary (Signed)
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  She had a PICC line placed and this has subsequently been removed since completion of IV antibiotics.  Patient is status post TIPS at [facility] on 08/20/2022.  And she is status post embolization of hemorrhoids 09/05/2022.  Patient is followed closely by gastroenterology and hepatology liver transplant at [facility].  She reportedly had last transfusion 10/09/2022.  She has chronic swelling in the legs and had been on diuretics but she was recently taken off all diuretics around 09/24/2022.  Around that time she was noted to have a small bump in creatinine.  She has had subsequent blood work done that shows that her creatinine is continue to climb and now up to greater than 4.  She does not seem to have symptoms for this now.  Her hematology clinic informed her about the abnormal labs and requested that she come into the hospital for further management. Nephrology was consulted to assist with management.  Her diuretics were initially held and subequently re-introduced.  Her renal function has gradually improved.  Assessment and  Plan: AKI on CKD stage IIIb -felt to be hemodynamically mediated due to frequent LVPs -initially held diuretics -prior baseline creatinine 1.4-1.7 -serum creatinine peaked 4.31 -Patient appears to be intravascularly volume depleted initially -Gently hydrate with IV fluid initially, now off all IV fluid -Renally dose all medications as appropriate, avoiding nephrotoxic agents -appreciate nephrology follow up -nephrology team starting IV albumin infusions with some improvement in creatinine noted intially -creatinine  continues to gradually improve -IV lasix started on 10/21/22 >>transition to po torsemide and spiro on 10/22/32 3/8--torsemide increased to 40 mg daily 3/8--discussed with renal, Dr. 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 -Patient is followed closely by [place] hepatology -Patient planning to have teeth removed as a final requirement for liver transplant consideration -Status post recent paracentesis 10/14/2022 where 8 L of fluid removed -10/21/22 paracentesis--7L -ammonia level elevated, ordered lactulose treatments but pt refuses to take in hospital  -mentation stable -IV lasix started on 10/21/22 >>po torsemide and spiro on 3/7   Chronic anemia -Secondary to thalassemia disease, managed by hematologist -Secondary to chronic kidney disease -pt declined ESA -Hgb largely stable -she has appointment at [facility] center on 10/29/22   Pancytopenia -Secondary chronic liver disease -No active bleeding complications at this time -Follow CBC intermittently   OSA on CPAP -offer nightly CPAP while in hospital   Hypotension -on midodrine   Hypokalemia -  judicious repletion        Consultants:  renal Procedures performed: none  Disposition: Home Diet recommendation:  Cardiac diet DISCHARGE MEDICATION: Allergies as of 10/25/2022       Reactions   Gramineae Pollens Other (See Comments)   Sneezing, Running nose   Latex Rash   Contact rash        Medication List     TAKE these medications    acetaminophen 650 MG CR tablet Commonly known as: TYLENOL Take 1,300 mg by mouth daily as needed for pain.   albuterol 108 (90 Base) MCG/ACT inhaler Commonly known as: VENTOLIN HFA Inhale 2 puffs into the lungs every 6 (six) hours as needed for wheezing or shortness of breath.   atorvastatin 10 MG tablet Commonly known as: LIPITOR Take 1 tablet (10 mg total) by mouth daily.   ciprofloxacin 500 MG tablet Commonly known as: CIPRO Take 500 mg by mouth daily with breakfast.   cyanocobalamin 1000 MCG tablet Commonly known as: VITAMIN B12 Take 1,000 mcg by mouth daily.   diphenhydrAMINE 25 MG tablet Commonly known as: BENADRYL Take 25 mg by mouth 2 (two) times daily as needed for itching.   ergocalciferol 1.25 MG (50000 UT) capsule Commonly known as: VITAMIN D2 Take 1 capsule (50,000 Units total) by mouth every 14 (fourteen) days.   gabapentin 400 MG capsule Commonly known as: NEURONTIN Take 800 mg by mouth 2 (two) times daily.   melatonin 5 MG Tabs Take 10 mg by mouth at bedtime as needed (sleep).   midodrine 10 MG tablet Commonly known as: PROAMATINE Take 1 tablet (10 mg total) by mouth 3 (three) times daily. Take 10 mg twice daily   multivitamin with minerals tablet Take 1 tablet by mouth daily.   oxymetazoline 0.05 % nasal spray Commonly known as: AFRIN Place 1 spray into both nostrils daily as needed for congestion.   polyethylene glycol 17 g packet Commonly known as: MIRALAX / GLYCOLAX Take 17 g by mouth 2 (two) times daily.   potassium chloride SA 20 MEQ tablet Commonly known as: KLOR-CON M Take 1 tablet (20 mEq total) by mouth 2 (two) times  daily. Start taking on: October 26, 2022   sertraline 50 MG tablet Commonly known as: ZOLOFT Take 50 mg by mouth daily.   spironolactone 25 MG tablet Commonly known as: ALDACTONE Take 1 tablet (25 mg total) by mouth daily. Start taking on: October 26, 2022   Torsemide 40 MG Tabs Take 40 mg by mouth daily. Start taking on: October 26, 2022   witch hazel-glycerin pad Commonly known as: TUCKS Apply topically as needed for hemorrhoids.        Discharge Exam: Filed Weights   10/23/22 0443 10/24/22 0401 10/25/22 0415  Weight: 115.5 kg 115.5 kg 112.9 kg   HEENT:  [place]/AT, No thrush, no icterus CV:  RRR, no rub, no S3, no S4 Lung:  CTA, no wheeze, no rhonchi Abd:  soft/+BS, NT Ext:  1 + LE edema, no lymphangitis, no synovitis, no rash   Condition at discharge: stable  The results of significant diagnostics from this hospitalization (including imaging, microbiology, ancillary and laboratory) are listed below for reference.   Imaging Studies: US Paracentesis  Result Date: 10/21/2022 INDICATION: Ascites. EXAM: ULTRASOUND GUIDED  PARACENTESIS MEDICATIONS: None. COMPLICATIONS: None immediate. PROCEDURE: Informed written consent was obtained from the patient after a discussion of the risks, benefits and alternatives to treatment. A timeout was performed prior to the initiation of the procedure. Initial ultrasound  scanning demonstrates a large amount of ascites within the right lower abdominal quadrant. The right lower abdomen was prepped and draped in the usual sterile fashion. 1% lidocaine was used for local anesthesia. Following this, a centesis catheter was introduced. The paracentesis was performed. The catheter was removed and a dressing was applied. The patient tolerated the procedure well without immediate post procedural complication. FINDINGS: A total of approximately 7 L of straw-colored fluid was removed. IMPRESSION: Successful ultrasound-guided paracentesis yielding 7 liters of  peritoneal fluid. Electronically Signed   By: Franki Cabot M.D.   On: 10/21/2022 10:58   [place] ASCITES (ABDOMEN LIMITED)  Result Date: 10/20/2022 CLINICAL DATA:  History cirrhosis for evaluation of ascites status post paracentesis on 10/14/2018 EXAM: LIMITED ABDOMEN ULTRASOUND FOR ASCITES TECHNIQUE: Limited ultrasound survey for ascites was performed in all four abdominal quadrants. COMPARISON:  Renal ultrasound dated 10/16/2022, ultrasound paracentesis dated 10/14/2022 FINDINGS: Interval reaccumulation of large volume ascites, similar to slightly larger volume compared to preprocedural images on 10/14/2022. IMPRESSION: Interval reaccumulation of large volume ascites, similar to slightly larger volume compared to preprocedural images on 10/14/2022. Electronically Signed   By: Darrin Nipper M.D.   On: 10/20/2022 14:53   US RENAL  Result Date: 10/16/2022 CLINICAL DATA:  Impaired renal function EXAM: RENAL / URINARY TRACT ULTRASOUND COMPLETE COMPARISON:  None Available. FINDINGS: Right Kidney: Length: 13.0 cm. Echogenicity within normal limits. No mass or hydronephrosis visualized. Left Kidney: Length: 11.4 cm. Echogenicity within normal limits. No mass or hydronephrosis visualized. Bladder: Appears normal for degree of bladder distention. Other: Note is made of large ascites in the lower abdomen. IMPRESSION: Unremarkable examination of the kidneys. Large ascites noted incidentally. Electronically Signed   By: Sammie Bench M.D.   On: 10/16/2022 10:23   US Paracentesis  Result Date: 10/14/2022 INDICATION: Refractory ascites in patient with history of NASH cirrhosis status post TIPS procedure May 2023 and TIPS revision July 2023 at [facility] She recently underwent second TIPS revision 08/20/2022. Request received for therapeutic paracentesis with 8 L max. EXAM: ULTRASOUND GUIDED THERAPEUTIC RIGHT UPPER QUADRANT PARACENTESIS MEDICATIONS: 10 mL 1 % lidocaine COMPLICATIONS: None immediate. PROCEDURE: Informed written  consent was obtained from the patient after a discussion of the risks, benefits and alternatives to treatment. A timeout was performed prior to the initiation of the procedure. Initial ultrasound scanning demonstrates a moderate amount of ascites within the right upper abdominal quadrant. The right upper abdomen was prepped and draped in the usual sterile fashion. 1% lidocaine was used for local anesthesia. Following this, a 19 gauge, 10-cm, Yueh catheter was introduced. An ultrasound image was saved for documentation purposes. The paracentesis was performed. The catheter was removed and a dressing was applied. The patient tolerated the procedure well without immediate post procedural complication. Patient received post-procedure intravenous albumin; see nursing notes for details. FINDINGS: A total of approximately 8 L of clear, yellow fluid was removed. IMPRESSION: Successful ultrasound-guided paracentesis yielding 8 liters of peritoneal fluid. Read by: Narda Rutherford, AGNP-BC Electronically Signed   By: Ruthann Cancer M.D.   On: 10/14/2022 13:49   IR Radiologist Eval & Mgmt  Result Date: 10/13/2022 EXAM: ESTABLISHED PATIENT OFFICE VISIT CHIEF COMPLAINT: See Epic note. HISTORY OF PRESENT ILLNESS: See Epic note. REVIEW OF SYSTEMS: See Epic note. PHYSICAL EXAMINATION: See Epic note. ASSESSMENT AND PLAN: See Epic note. Ruthann Cancer, MD [facility] Radiology Electronically Signed   By: Ruthann Cancer M.D.   On: 10/13/2022 13:43   US Paracentesis  Result Date: 10/08/2022 INDICATION: Refractory ascites in patient with history of NASH cirrhosis status post TIPS procedure May 2023 and TIPS revision July 2023 at [facility]. She recently underwent a second TIPS revision 08/20/2022. Request received for therapeutic paracentesis. EXAM: ULTRASOUND GUIDED therapeutic left lower quadrant PARACENTESIS MEDICATIONS: 10 cc 1% lidocaine COMPLICATIONS: None immediate. PROCEDURE: Informed  written consent was obtained from the patient after a discussion of the risks, benefits and alternatives to treatment. A timeout was performed prior to the initiation of the procedure. Initial ultrasound scanning demonstrates a large amount of ascites within the left lower abdominal quadrant. The left lower abdomen was prepped and draped in the usual sterile fashion. 1% lidocaine was used for local anesthesia. Following this, a 19 gauge, 10-cm, Yueh catheter was introduced. An ultrasound image was saved for documentation purposes. The paracentesis was performed. The catheter was removed and a dressing was applied. The patient tolerated the procedure well without immediate post procedural complication. Patient received post-procedure intravenous albumin; see nursing notes for details. FINDINGS: A total of approximately 6 L of yellow fluid was removed. IMPRESSION: Successful ultrasound-guided paracentesis yielding 6 liters of peritoneal fluid. PLAN: Patient has management of TIPS done by [facility] hospital. Read by: Reatha Armour, PA-C Electronically Signed   By: Miachel Roux M.D.   On: 10/08/2022 12:20   US Paracentesis  Result Date: 09/30/2022 INDICATION: Refractory ascites in patient with history of NASH cirrhosis status post TIPS procedure May 2023 and TIPS revision July 2023 at [facility]. She recently underwent a second TIPS revision 08/20/2022. Request received for therapeutic paracentesis with 8 L max. EXAM: ULTRASOUND GUIDED THERAPEUTIC RIGHT UPPER QUADRANT PARACENTESIS MEDICATIONS: 10 mL 1 % lidocaine COMPLICATIONS: None immediate. PROCEDURE: Informed written consent was obtained from the patient after a discussion of the risks, benefits and alternatives to treatment. A timeout was performed prior to the initiation of the procedure. Initial ultrasound scanning demonstrates a moderate amount of ascites within the right upper abdominal quadrant. The right upper abdomen was prepped and draped in the usual sterile  fashion. 1% lidocaine was used for local anesthesia. Following this, a 19 gauge, 10-cm, Yueh catheter was introduced. An ultrasound image was saved for documentation purposes. The paracentesis was performed. The catheter was removed and a dressing was applied. The patient tolerated the procedure well without immediate post procedural complication. Patient received post-procedure intravenous albumin; see nursing notes for details. FINDINGS: A total of approximately 8 L of clear, yellow fluid was removed. IMPRESSION: Successful ultrasound-guided paracentesis yielding 8 liters of peritoneal fluid. Read by: Narda Rutherford, AGNP-BC Electronically Signed   By: Albin Felling M.D.   On: 09/30/2022 13:42    Microbiology: Results for orders placed or performed during the hospital encounter of 09/17/22  Culture, blood (Routine x 2)     Status: Abnormal   Collection Time: 09/17/22  9:11 PM   Specimen: Porta Cath; Blood  Result Value Ref Range Status   Specimen Description   Final    PORTA CATH Performed at [facility], [address] [place], [place] [address]    Special Requests   Final    BOTTLES DRAWN AEROBIC AND ANAEROBIC Blood Culture adequate volume Performed at [place] [place], [place] [address]    Culture  Setup Time   Final    GRAM POSITIVE COCCI Gram Stain Report Called to,Read Back By and Verified With: J HUMPHRIES RN [phone] K FORSYTH AEROBIC BOTTLE ONLY ANAEROBIC BOTTLE ALSO ON 09/18/22 AT 1920 BY LOY,C CRITICAL RESULT  CALLED TO, READ BACK BY AND VERIFIED WITH: RN JADA MUSE ON 09/18/22 @ 2106 BY DRT    Culture (A)  Final    STAPHYLOCOCCUS CAPITIS SUSCEPTIBILITIES PERFORMED ON PREVIOUS CULTURE WITHIN THE LAST 5 DAYS. Performed at [facility], [place] [address], [place], [place] [address]    Report Status 09/22/2022 FINAL  Final  Blood Culture ID Panel (Reflexed)     Status: Abnormal   Collection Time: 09/17/22  9:11 PM  Result Value Ref Range Status    Enterococcus faecalis NOT DETECTED NOT DETECTED Final   Enterococcus Faecium NOT DETECTED NOT DETECTED Final   Listeria monocytogenes NOT DETECTED NOT DETECTED Final   Staphylococcus species DETECTED (A) NOT DETECTED Final    Comment: CRITICAL RESULT CALLED TO, READ BACK BY AND VERIFIED WITH: RN JADA MUSE ON 09/18/22 @ 2106 BY DRT    Staphylococcus aureus (BCID) NOT DETECTED NOT DETECTED Final   Staphylococcus epidermidis NOT DETECTED NOT DETECTED Final   Staphylococcus lugdunensis NOT DETECTED NOT DETECTED Final   Streptococcus species NOT DETECTED NOT DETECTED Final   Streptococcus agalactiae NOT DETECTED NOT DETECTED Final   Streptococcus pneumoniae NOT DETECTED NOT DETECTED Final   Streptococcus pyogenes NOT DETECTED NOT DETECTED Final   A.calcoaceticus-baumannii NOT DETECTED NOT DETECTED Final   Bacteroides fragilis NOT DETECTED NOT DETECTED Final   Enterobacterales NOT DETECTED NOT DETECTED Final   Enterobacter cloacae complex NOT DETECTED NOT DETECTED Final   Escherichia coli NOT DETECTED NOT DETECTED Final   Klebsiella aerogenes NOT DETECTED NOT DETECTED Final   Klebsiella oxytoca NOT DETECTED NOT DETECTED Final   Klebsiella pneumoniae NOT DETECTED NOT DETECTED Final   Proteus species NOT DETECTED NOT DETECTED Final   Salmonella species NOT DETECTED NOT DETECTED Final   Serratia marcescens NOT DETECTED NOT DETECTED Final   Haemophilus influenzae NOT DETECTED NOT DETECTED Final   Neisseria meningitidis NOT DETECTED NOT DETECTED Final   Pseudomonas aeruginosa NOT DETECTED NOT DETECTED Final   Stenotrophomonas maltophilia NOT DETECTED NOT DETECTED Final   Candida albicans NOT DETECTED NOT DETECTED Final   Candida auris NOT DETECTED NOT DETECTED Final   Candida glabrata NOT DETECTED NOT DETECTED Final   Candida krusei NOT DETECTED NOT DETECTED Final   Candida parapsilosis NOT DETECTED NOT DETECTED Final   Candida tropicalis NOT DETECTED NOT DETECTED Final   Cryptococcus  neoformans/gattii NOT DETECTED NOT DETECTED Final    Comment: Performed at [facility], [place]. [address] [place], [place] [address]  Urine Culture (for pregnant, neutropenic or urologic patients or patients with an indwelling urinary catheter)     Status: None   Collection Time: 09/17/22  9:20 PM   Specimen: Urine, Clean Catch  Result Value Ref Range Status   Specimen Description   Final    URINE, CLEAN CATCH Performed at [facility], [address], [place], [place] [address]    Special Requests   Final    Normal Performed at [facility], [address] [place], [place] [address]    Culture   Final    NO GROWTH Performed at [facility], [place] [address] [place],  [address]    Report Status 09/19/2022 FINAL  Final  Culture, blood (Routine x 2)     Status: Abnormal   Collection Time: 09/17/22  9:40 PM   Specimen: Left Antecubital; Blood  Result Value Ref Range Status   Specimen Description   Final    LEFT ANTECUBITAL Performed at [facility], [address], [place], [place] [address]    Special Requests   Final    BOTTLES DRAWN AEROBIC AND ANAEROBIC Blood Culture adequate volume Performed at [facility], [address], [place], [place] [address]    Culture  Setup Time   Final    AEROBIC BOTTLE ONLY GRAM POSITIVE COCCI CRITICAL VALUE NOTED.  VALUE IS CONSISTENT WITH PREVIOUSLY REPORTED AND CALLED VALUE. ANAEROBIC BOTTLE GRAM POSITIVE COCCI    Culture STAPHYLOCOCCUS CAPITIS (A)  Final   Report Status 09/21/2022 FINAL  Final   Organism ID, Bacteria STAPHYLOCOCCUS CAPITIS  Final      Susceptibility   Staphylococcus capitis - MIC*    CIPROFLOXACIN <=0.5 SENSITIVE Sensitive     ERYTHROMYCIN <=0.25 SENSITIVE Sensitive     GENTAMICIN <=0.5 SENSITIVE Sensitive     OXACILLIN <=0.25 SENSITIVE Sensitive     TETRACYCLINE <=1 SENSITIVE Sensitive     VANCOMYCIN <=0.5 SENSITIVE Sensitive     TRIMETH/SULFA <=10 SENSITIVE Sensitive     CLINDAMYCIN <=0.25 SENSITIVE  Sensitive     RIFAMPIN <=0.5 SENSITIVE Sensitive     Inducible Clindamycin NEGATIVE Sensitive     * STAPHYLOCOCCUS CAPITIS  Resp panel by RT-PCR (RSV, Flu A&B, Covid) Anterior Nasal Swab     Status: None   Collection Time: 09/17/22 10:53 PM   Specimen: Anterior Nasal Swab  Result Value Ref Range Status   SARS Coronavirus 2 by RT PCR NEGATIVE NEGATIVE Final    Comment: (NOTE) SARS-CoV-2 target nucleic acids are NOT DETECTED.  The SARS-CoV-2 RNA is generally detectable in upper respiratory specimens during the acute phase of infection. The lowest concentration of SARS-CoV-2 viral copies this assay can detect is 138 copies/mL. A negative result does not preclude SARS-Cov-2 infection and should not be used as the sole basis for treatment or other patient management decisions. A negative result may occur with  improper specimen collection/handling, submission of specimen other than nasopharyngeal swab, presence of viral mutation(s) within the areas targeted by this assay, and inadequate number of viral copies(<138 copies/mL). A negative result must be combined with clinical observations, patient history, and epidemiological information. The expected result is Negative.  Fact Sheet for Patients:  EntrepreneurPulse.com.au  Fact Sheet for Healthcare Providers:  IncredibleEmployment.be  This test is no t yet approved or cleared by the [place] FDA and  has been authorized for detection and/or diagnosis of SARS-CoV-2 by FDA under an Emergency Use Authorization (EUA). This EUA will remain  in effect (meaning this test can be used) for the duration of the COVID-19 declaration under Section 564(b)(1) of the Act, 21 U.S.C.section 360bbb-3(b)(1), unless the authorization is terminated  or revoked sooner.       Influenza A by PCR NEGATIVE NEGATIVE Final   Influenza B by PCR NEGATIVE NEGATIVE Final    Comment: (NOTE) The Xpert Xpress  SARS-CoV-2/FLU/RSV plus assay is intended as an aid in the diagnosis of influenza from Nasopharyngeal swab specimens and should not be used as a sole basis for treatment. Nasal washings and aspirates are unacceptable for Xpert Xpress SARS-CoV-2/FLU/RSV testing.  Fact Sheet for Patients: EntrepreneurPulse.com.au  Fact Sheet for Healthcare Providers: IncredibleEmployment.be  This test is not yet approved or cleared by the [place] FDA and has been authorized for detection and/or diagnosis of SARS-CoV-2 by FDA under an Emergency Use Authorization (EUA). This EUA will remain in effect (meaning this test can be used) for the duration of the COVID-19 declaration under Section 564(b)(1) of the Act, 21 U.S.C. section 360bbb-3(b)(1), unless the  authorization is terminated or revoked.     Resp Syncytial Virus by PCR NEGATIVE NEGATIVE Final    Comment: (NOTE) Fact Sheet for Patients: EntrepreneurPulse.com.au  Fact Sheet for Healthcare Providers: IncredibleEmployment.be  This test is not yet approved or cleared by the [place] FDA and has been authorized for detection and/or diagnosis of SARS-CoV-2 by FDA under an Emergency Use Authorization (EUA). This EUA will remain in effect (meaning this test can be used) for the duration of the COVID-19 declaration under Section 564(b)(1) of the Act, 21 U.S.C. section 360bbb-3(b)(1), unless the authorization is terminated or revoked.  Performed at [facility], [address], [place], [place] [address]   Culture, body fluid w Gram Stain-bottle     Status: None   Collection Time: 09/18/22  5:00 AM   Specimen: Peritoneal Washings  Result Value Ref Range Status   Specimen Description PERITONEAL  Final   Special Requests BOTTLES DRAWN AEROBIC AND ANAEROBIC 10CC  Final   Culture   Final    NO GROWTH 5 DAYS Performed at [facility], [address], [place], [place]  [address]    Report Status 09/23/2022 FINAL  Final  Gram stain     Status: None   Collection Time: 09/18/22  5:00 AM   Specimen: Peritoneal Washings  Result Value Ref Range Status   Specimen Description PERITONEAL  Final   Special Requests NONE  Final   Gram Stain   Final    CYTOSPIN SMEAR NO ORGANISMS SEEN WBC PRESENT,BOTH PMN AND MONONUCLEAR Performed at [facility], [address], [place], [place] [address]    Report Status 09/18/2022 FINAL  Final  Culture, blood (Routine X 2) w Reflex to ID Panel     Status: None   Collection Time: 09/19/22 12:38 AM   Specimen: BLOOD  Result Value Ref Range Status   Specimen Description BLOOD BLOOD RIGHT WRIST  Final   Special Requests   Final    BOTTLES DRAWN AEROBIC AND ANAEROBIC Blood Culture adequate volume   Culture   Final    NO GROWTH 6 DAYS Performed at [facility], [address], [place], [place] [address]    Report Status 09/25/2022 FINAL  Final  Culture, blood (Routine X 2) w Reflex to ID Panel     Status: None   Collection Time: 09/19/22 12:40 AM   Specimen: BLOOD  Result Value Ref Range Status   Specimen Description BLOOD BLOOD RIGHT HAND  Final   Special Requests   Final    BOTTLES DRAWN AEROBIC AND ANAEROBIC Blood Culture adequate volume   Culture   Final    NO GROWTH 6 DAYS Performed at [facility], [address], [place], [place] [address]    Report Status 09/25/2022 FINAL  Final  Culture, blood (single) w Reflex to ID Panel     Status: None   Collection Time: 09/22/22  8:49 AM   Specimen: Right Antecubital; Blood  Result Value Ref Range Status   Specimen Description RIGHT ANTECUBITAL  Final   Special Requests   Final    BOTTLES DRAWN AEROBIC AND ANAEROBIC Blood Culture results may not be optimal due to an excessive volume of blood received in culture bottles   Culture   Final    NO GROWTH 5 DAYS Performed at [facility], [address], [place], [place] [address]    Report Status 09/27/2022 FINAL  Final  Culture,  body fluid w Gram Stain-bottle     Status: None   Collection  Time: 09/22/22 10:31 AM   Specimen: Peritoneal Washings  Result Value Ref Range Status   Specimen Description PERITONEAL BOTTLES DRAWN AEROBIC AND ANAEROBIC  Final   Special Requests Blood Culture adequate volume  Final   Culture   Final    NO GROWTH 5 DAYS Performed at [facility], [address] [place], [place] [address]    Report Status 09/27/2022 FINAL  Final  Gram stain     Status: None   Collection Time: 09/22/22 10:31 AM   Specimen: Peritoneal Washings  Result Value Ref Range Status   Specimen Description PERITONEAL  Final   Special Requests NONE  Final   Gram Stain   Final    NO ORGANISMS SEEN WBC PRESENT,BOTH PMN AND MONONUCLEAR CYTOSPIN SMEAR Performed at [facility], [address] [place], [place] [address]    Report Status 09/22/2022 FINAL  Final  Surgical pcr screen     Status: None   Collection Time: 09/23/22  5:18 AM   Specimen: Nasal Mucosa; Nasal Swab  Result Value Ref Range Status   MRSA, PCR NEGATIVE NEGATIVE Final   Staphylococcus aureus NEGATIVE NEGATIVE Final    Comment: (NOTE) The Xpert SA Assay (FDA approved for NASAL specimens in patients 55 years of age and older), is one component of a comprehensive surveillance program. It is not intended to diagnose infection nor to guide or monitor treatment. Performed at [facility], [address], [place], [place] [address]   Aerobic/Anaerobic Culture w Gram Stain (surgical/deep wound)     Status: None   Collection Time: 09/23/22  9:03 AM   Specimen: Catheter Tip  Result Value Ref Range Status   Specimen Description   Final    CATH TIP Performed at [facility], [address], [place], [place] [address]    Special Requests   Final    NONE Performed at [facility], [address], [place], [place] [address]    Gram Stain NO WBC SEEN NO ORGANISMS SEEN   Final   Culture   Final    No growth aerobically or anaerobically. Performed at  [facility], [place] [address] [place], [place] [address]    Report Status 09/28/2022 FINAL  Final    Labs: CBC: Recent Labs  Lab 10/22/22 0544 10/24/22 0423  WBC 3.0* 2.9*  HGB 7.3* 7.0*  HCT 23.4* 23.0*  MCV 79.6* 78.8*  PLT 76* 74*   Basic Metabolic Panel: Recent Labs  Lab 10/21/22 0411 10/22/22 0430 10/23/22 0426 10/24/22 0423 10/25/22 0505  NA 140 139 140 141 140  K 3.9 3.4* 3.0* 3.1* 4.2  CL 109 110 106 106 105  CO2 20* 19* '25 25 27  '$ GLUCOSE 121* 110* 108* 109* 103*  BUN 61* 57* 51* 51* 49*  CREATININE 2.76* 2.45* 2.38* 2.37* 2.32*  CALCIUM 8.5* 8.1* 8.1* 8.1* 8.2*  PHOS 4.6 4.5 4.0 3.7 3.2   Liver Function Tests: Recent Labs  Lab 10/21/22 0411 10/22/22 0430 10/23/22 0426 10/24/22 0423 10/25/22 0505  ALBUMIN 2.9* 2.7* 2.8* 2.6* 2.8*   CBG: No results for input(s): "GLUCAP" in the last 168 hours.  Discharge time spent: greater than 30 minutes.  Signed: Orson Eva, MD [facility] 10/25/2022

## 2022-10-25 NOTE — TOC Transition Note (Signed)
Transition of Care [facility]) - CM/SW Discharge Note   Patient Details  Name: Rachel Huber MRN: [id number] Date of Birth: [birth year]  Transition of Care (TOC) CM/SW Contact:  Joaquin Courts, RN Phone Number: 10/25/2022, 10:29 AM   Clinical Narrative:    CM reviewed chart, patient is anticipated to dc home today with no needs.  TOC signing off at this time.     Barriers to Discharge: Continued Medical Work up   Patient Goals and CMS Choice   Choice offered to / list presented to : Patient  Discharge Placement                         Discharge Plan and Services Additional resources added to the After Visit Summary for   In-house Referral: Clinical Social Work                                   Social Determinants of Health ([place]) Interventions SDOH Screenings   Food Insecurity: No Food Insecurity (10/15/2022)  Housing: Low Risk  (10/15/2022)  Transportation Needs: No Transportation Needs (10/15/2022)  Utilities: Not At Risk (10/15/2022)  Alcohol Screen: Low Risk  (04/04/2022)  Depression (PHQ2-9): Low Risk  (04/04/2022)  Financial Resource Strain: Low Risk  (04/04/2022)  Physical Activity: Inactive (04/04/2022)  Social Connections: Moderately Isolated (04/04/2022)  Stress: No Stress Concern Present (04/04/2022)  Tobacco Use: Low Risk  (10/21/2022)     Readmission Risk Interventions    10/17/2022    9:17 AM 09/18/2022    2:19 PM 03/26/2022    2:40 PM  Readmission Risk Prevention Plan  Transportation Screening Complete Complete Complete  Medication Review Press photographer) Complete Complete Complete  PCP or Specialist appointment within 3-5 days of discharge  Complete Complete  HRI or Home Care Consult Complete Complete Complete  SW Recovery Care/Counseling Consult Complete Complete Complete  Palliative Care Screening Not Applicable Not Applicable Not [place] Not Applicable Not Applicable Not Applicable

## 2022-10-25 NOTE — Plan of Care (Signed)
  Problem: Health Behavior/Discharge Planning: Goal: Ability to manage health-related needs will improve Outcome: Progressing   

## 2022-10-28 ENCOUNTER — Other Ambulatory Visit: Payer: Self-pay

## 2022-10-28 ENCOUNTER — Ambulatory Visit
Admission: RE | Admit: 2022-10-28 | Discharge: 2022-10-28 | Disposition: A | Discharge status: Home / Self Care | Payer: 59 | Source: Ambulatory Visit | Attending: Gastroenterology | Admitting: Gastroenterology

## 2022-10-28 DIAGNOSIS — K7460 Unspecified cirrhosis of liver: Secondary | ICD-10-CM | POA: Diagnosis not present

## 2022-10-28 DIAGNOSIS — K7581 Nonalcoholic steatohepatitis (NASH): Secondary | ICD-10-CM | POA: Insufficient documentation

## 2022-10-28 DIAGNOSIS — D500 Iron deficiency anemia secondary to blood loss (chronic): Secondary | ICD-10-CM

## 2022-10-28 DIAGNOSIS — R188 Other ascites: Secondary | ICD-10-CM | POA: Diagnosis not present

## 2022-10-28 MED ORDER — LIDOCAINE HCL (PF) 1 % IJ SOLN
10.0000 mL | Freq: Once | INTRAMUSCULAR | Status: AC
  Administered 2022-10-28: 10 mL via INTRADERMAL
  Filled 2022-10-28: qty 10

## 2022-10-28 MED ORDER — ALBUMIN HUMAN 25 % IV SOLN
INTRAVENOUS | Status: AC
  Administered 2022-10-28: 25 g via INTRAVENOUS
  Filled 2022-10-28: qty 100

## 2022-10-28 MED ORDER — ALBUMIN HUMAN 25 % IV SOLN: 25.0000 g | Freq: Once | INTRAVENOUS | Status: AC

## 2022-10-28 NOTE — Procedures (Signed)
PROCEDURE SUMMARY:  Successful US guided therapeutic paracentesis from RLQ.  Yielded 5.35 L of clear, yellow fluid.  No immediate complications.  Pt tolerated well.   Specimen not sent for labs.  EBL < 1 mL  Tyson Alias, AGNP 10/28/2022 12:24 PM

## 2022-10-29 ENCOUNTER — Inpatient Hospital Stay: Payer: 59 | Attending: Hematology

## 2022-10-29 ENCOUNTER — Inpatient Hospital Stay: Payer: 59

## 2022-10-29 DIAGNOSIS — D563 Thalassemia minor: Secondary | ICD-10-CM | POA: Insufficient documentation

## 2022-10-29 DIAGNOSIS — D500 Iron deficiency anemia secondary to blood loss (chronic): Secondary | ICD-10-CM | POA: Diagnosis not present

## 2022-10-29 DIAGNOSIS — K7469 Other cirrhosis of liver: Secondary | ICD-10-CM | POA: Diagnosis not present

## 2022-10-29 LAB — CBC WITH DIFFERENTIAL/PLATELET
Basophils Absolute: 0 10*3/uL (ref 0.0–0.1)
Basophils Relative: 1 %
Eosinophils Absolute: 0.2 10*3/uL (ref 0.0–0.5)
Eosinophils Relative: 6 %
HCT: 17.3 % — ABNORMAL LOW (ref 36.0–46.0)
Hemoglobin: 5.3 g/dL — CL (ref 12.0–15.0)
Lymphocytes Relative: 15 %
Lymphs Abs: 0.5 10*3/uL — ABNORMAL LOW (ref 0.7–4.0)
MCH: 23.8 pg — ABNORMAL LOW (ref 26.0–34.0)
MCHC: 30.6 g/dL (ref 30.0–36.0)
MCV: 77.6 fL — ABNORMAL LOW (ref 80.0–100.0)
Monocytes Absolute: 0.3 10*3/uL (ref 0.1–1.0)
Monocytes Relative: 8 %
Neutro Abs: 2.4 10*3/uL (ref 1.7–7.7)
Neutrophils Relative %: 70 %
Platelets: 94 10*3/uL — ABNORMAL LOW (ref 150–400)
RBC: 2.23 MIL/uL — ABNORMAL LOW (ref 3.87–5.11)
RDW: 22.2 % — ABNORMAL HIGH (ref 11.5–15.5)
WBC: 3.5 10*3/uL — ABNORMAL LOW (ref 4.0–10.5)
nRBC: 0 % (ref 0.0–0.2)

## 2022-10-29 LAB — SAMPLE TO BLOOD BANK

## 2022-10-29 LAB — PREPARE RBC (CROSSMATCH)

## 2022-10-29 NOTE — Progress Notes (Unsigned)
CRITICAL VALUE ALERT Critical value received:  hgb 5.3 Date of notification:  10-29-22 Time of notification: 1150 Critical value read back:  Yes.   Nurse who received alert:  C. Alyxander Kollmann RN MD notified time and response:  1220, Tarri Abernethy PA-C. Ordered two units of blood for tomorrow.

## 2022-10-30 ENCOUNTER — Inpatient Hospital Stay: Payer: 59

## 2022-10-30 ENCOUNTER — Other Ambulatory Visit (HOSPITAL_COMMUNITY): Payer: Self-pay | Admitting: Student

## 2022-10-30 DIAGNOSIS — D563 Thalassemia minor: Secondary | ICD-10-CM | POA: Diagnosis not present

## 2022-10-30 DIAGNOSIS — D500 Iron deficiency anemia secondary to blood loss (chronic): Secondary | ICD-10-CM

## 2022-10-30 MED ORDER — SODIUM CHLORIDE 0.9% IV SOLUTION: 250.0000 mL | Freq: Once | INTRAVENOUS | Status: DC

## 2022-10-31 ENCOUNTER — Encounter: Payer: Self-pay | Admitting: Hematology

## 2022-10-31 ENCOUNTER — Ambulatory Visit: Payer: 59 | Admitting: Radiology

## 2022-10-31 LAB — TYPE AND SCREEN
ABO/RH(D): A POS
Antibody Screen: NEGATIVE
Unit division: 0
Unit division: 0

## 2022-10-31 LAB — BPAM RBC
Blood Product Expiration Date: 202404112359
Blood Product Expiration Date: 202404112359
ISSUE DATE / TIME: 202403141042
ISSUE DATE / TIME: 202403141236
Unit Type and Rh: 6200
Unit Type and Rh: 6200

## 2022-10-31 NOTE — Progress Notes (Signed)
Late entry  2 units of blood given per orders. Patient tolerated it well without problems. Vitals stable and discharged home from clinic via wheelchair. Follow up as scheduled.

## 2022-10-31 NOTE — Progress Notes (Signed)
Patient for IR Port Insertion on Mon 11/03/2022, I called and spoke with the patient on the phone and gave pre-procedure instructions. Pt was made aware to be here at 11:30a, NPO after MN prior to procedure as well as driver post procedure/recovery/discharge. Pt stated understanding.  Called 10/31/2022

## 2022-11-02 IMAGING — US IR PARACENTESIS
1 series · 1 of 1 positions shown · non-contrast
Comparison: none

INDICATION: Patient with history of NASH cirrhosis with recurrent ascites.
Request is for therapeutic paracentesis. Maximum 8 L removal

[Series 1: ir paracentesis · 0.26mm/px · 1 of 1 slices shown]
[im 1/1]
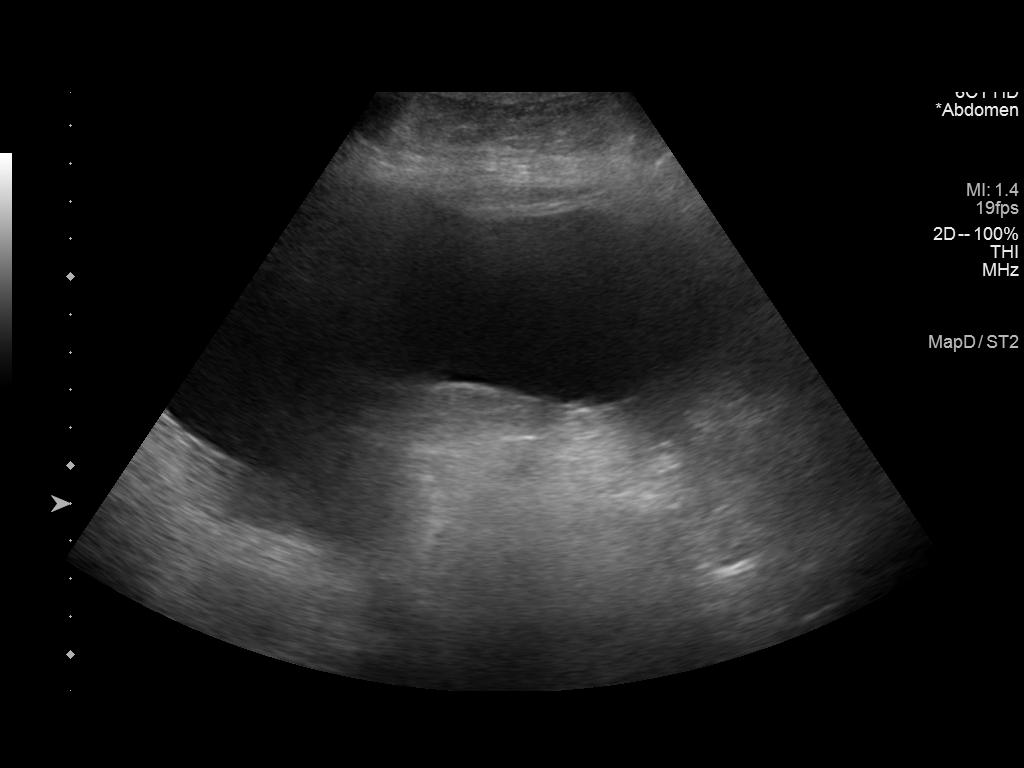

[1 of 1 positions shown; findings below may reference images not displayed]

EXAM:
ULTRASOUND GUIDED THERAPEUTIC PARACENTESIS

MEDICATIONS:
Lidocaine 1% 10 mL

COMPLICATIONS:
None immediate.

PROCEDURE:
Informed written consent was obtained from the patient after a
discussion of the risks, benefits and alternatives to treatment. A
timeout was performed prior to the initiation of the procedure.

Initial ultrasound scanning demonstrates a large amount of ascites
within the left lower abdominal quadrant. The left lower abdomen was
prepped and draped in the usual sterile fashion. 1% lidocaine was
used for local anesthesia.

Following this, a 19 gauge, 7-cm, Yueh catheter was introduced. An
ultrasound image was saved for documentation purposes. The
paracentesis was performed. The catheter was removed and a dressing
was applied. The patient tolerated the procedure well without
immediate post procedural complication.
Patient received post-procedure intravenous albumin; see nursing
notes for details.
FINDINGS: A total of approximately 8 L of straw-colored fluid was removed.
IMPRESSION: Successful ultrasound-guided therapeutic paracentesis yielding 8
liters of peritoneal fluid.

PLAN:
If the patient eventually requires >/=2 paracenteses in a 30 day
period, candidacy for formal evaluation [place] for TIPS possible
transplant.

## 2022-11-03 ENCOUNTER — Other Ambulatory Visit: Payer: Self-pay

## 2022-11-03 ENCOUNTER — Ambulatory Visit
Admission: RE | Admit: 2022-11-03 | Discharge: 2022-11-03 | Disposition: A | Discharge status: Home / Self Care | Payer: 59 | Source: Ambulatory Visit | Attending: Physician Assistant | Admitting: Physician Assistant

## 2022-11-03 ENCOUNTER — Inpatient Hospital Stay
Admission: EM | Admit: 2022-11-03 | Discharge: 2022-11-06 | DRG: 357 | Disposition: A | Discharge status: Home / Self Care | Payer: 59 | Attending: Internal Medicine | Admitting: Internal Medicine

## 2022-11-03 DIAGNOSIS — K7581 Nonalcoholic steatohepatitis (NASH): Secondary | ICD-10-CM | POA: Diagnosis not present

## 2022-11-03 DIAGNOSIS — K766 Portal hypertension: Secondary | ICD-10-CM | POA: Diagnosis present

## 2022-11-03 DIAGNOSIS — K7031 Alcoholic cirrhosis of liver with ascites: Secondary | ICD-10-CM | POA: Diagnosis present

## 2022-11-03 DIAGNOSIS — E785 Hyperlipidemia, unspecified: Secondary | ICD-10-CM | POA: Diagnosis not present

## 2022-11-03 DIAGNOSIS — Z833 Family history of diabetes mellitus: Secondary | ICD-10-CM

## 2022-11-03 DIAGNOSIS — K625 Hemorrhage of anus and rectum: Secondary | ICD-10-CM | POA: Diagnosis not present

## 2022-11-03 DIAGNOSIS — Z9109 Other allergy status, other than to drugs and biological substances: Secondary | ICD-10-CM

## 2022-11-03 DIAGNOSIS — R9431 Abnormal electrocardiogram [ECG] [EKG]: Secondary | ICD-10-CM | POA: Diagnosis not present

## 2022-11-03 DIAGNOSIS — E875 Hyperkalemia: Secondary | ICD-10-CM | POA: Diagnosis not present

## 2022-11-03 DIAGNOSIS — K7460 Unspecified cirrhosis of liver: Principal | ICD-10-CM | POA: Diagnosis present

## 2022-11-03 DIAGNOSIS — D569 Thalassemia, unspecified: Secondary | ICD-10-CM

## 2022-11-03 DIAGNOSIS — D631 Anemia in chronic kidney disease: Secondary | ICD-10-CM | POA: Diagnosis not present

## 2022-11-03 DIAGNOSIS — D62 Acute posthemorrhagic anemia: Secondary | ICD-10-CM | POA: Diagnosis not present

## 2022-11-03 DIAGNOSIS — E559 Vitamin D deficiency, unspecified: Secondary | ICD-10-CM | POA: Diagnosis present

## 2022-11-03 DIAGNOSIS — N1832 Chronic kidney disease, stage 3b: Secondary | ICD-10-CM | POA: Diagnosis present

## 2022-11-03 DIAGNOSIS — R188 Other ascites: Secondary | ICD-10-CM | POA: Diagnosis not present

## 2022-11-03 DIAGNOSIS — E1122 Type 2 diabetes mellitus with diabetic chronic kidney disease: Secondary | ICD-10-CM | POA: Diagnosis present

## 2022-11-03 DIAGNOSIS — Z8616 Personal history of COVID-19: Secondary | ICD-10-CM

## 2022-11-03 DIAGNOSIS — G4700 Insomnia, unspecified: Secondary | ICD-10-CM | POA: Diagnosis present

## 2022-11-03 DIAGNOSIS — Z6835 Body mass index (BMI) 35.0-35.9, adult: Secondary | ICD-10-CM

## 2022-11-03 DIAGNOSIS — Z8249 Family history of ischemic heart disease and other diseases of the circulatory system: Secondary | ICD-10-CM | POA: Diagnosis not present

## 2022-11-03 DIAGNOSIS — E1140 Type 2 diabetes mellitus with diabetic neuropathy, unspecified: Secondary | ICD-10-CM | POA: Diagnosis present

## 2022-11-03 DIAGNOSIS — E876 Hypokalemia: Secondary | ICD-10-CM | POA: Diagnosis not present

## 2022-11-03 DIAGNOSIS — Z961 Presence of intraocular lens: Secondary | ICD-10-CM | POA: Diagnosis present

## 2022-11-03 DIAGNOSIS — E669 Obesity, unspecified: Secondary | ICD-10-CM | POA: Diagnosis present

## 2022-11-03 DIAGNOSIS — I129 Hypertensive chronic kidney disease with stage 1 through stage 4 chronic kidney disease, or unspecified chronic kidney disease: Secondary | ICD-10-CM | POA: Diagnosis present

## 2022-11-03 DIAGNOSIS — D649 Anemia, unspecified: Secondary | ICD-10-CM | POA: Diagnosis not present

## 2022-11-03 DIAGNOSIS — K219 Gastro-esophageal reflux disease without esophagitis: Secondary | ICD-10-CM | POA: Diagnosis present

## 2022-11-03 DIAGNOSIS — N184 Chronic kidney disease, stage 4 (severe): Secondary | ICD-10-CM | POA: Diagnosis present

## 2022-11-03 DIAGNOSIS — Z9842 Cataract extraction status, left eye: Secondary | ICD-10-CM

## 2022-11-03 DIAGNOSIS — K643 Fourth degree hemorrhoids: Secondary | ICD-10-CM | POA: Diagnosis not present

## 2022-11-03 DIAGNOSIS — Z79899 Other long term (current) drug therapy: Secondary | ICD-10-CM

## 2022-11-03 DIAGNOSIS — Z1152 Encounter for screening for COVID-19: Secondary | ICD-10-CM | POA: Diagnosis not present

## 2022-11-03 DIAGNOSIS — Z9841 Cataract extraction status, right eye: Secondary | ICD-10-CM

## 2022-11-03 DIAGNOSIS — D563 Thalassemia minor: Secondary | ICD-10-CM | POA: Diagnosis present

## 2022-11-03 DIAGNOSIS — Z8619 Personal history of other infectious and parasitic diseases: Secondary | ICD-10-CM | POA: Diagnosis not present

## 2022-11-03 DIAGNOSIS — G4733 Obstructive sleep apnea (adult) (pediatric): Secondary | ICD-10-CM | POA: Diagnosis present

## 2022-11-03 DIAGNOSIS — M1990 Unspecified osteoarthritis, unspecified site: Secondary | ICD-10-CM | POA: Diagnosis present

## 2022-11-03 DIAGNOSIS — Z96611 Presence of right artificial shoulder joint: Secondary | ICD-10-CM | POA: Diagnosis present

## 2022-11-03 DIAGNOSIS — E1169 Type 2 diabetes mellitus with other specified complication: Secondary | ICD-10-CM | POA: Diagnosis present

## 2022-11-03 DIAGNOSIS — K921 Melena: Secondary | ICD-10-CM | POA: Diagnosis not present

## 2022-11-03 DIAGNOSIS — Z91040 Latex allergy status: Secondary | ICD-10-CM

## 2022-11-03 DIAGNOSIS — I959 Hypotension, unspecified: Secondary | ICD-10-CM | POA: Insufficient documentation

## 2022-11-03 DIAGNOSIS — Z8701 Personal history of pneumonia (recurrent): Secondary | ICD-10-CM

## 2022-11-03 DIAGNOSIS — Z7682 Awaiting organ transplant status: Secondary | ICD-10-CM | POA: Diagnosis not present

## 2022-11-03 DIAGNOSIS — Z90710 Acquired absence of both cervix and uterus: Secondary | ICD-10-CM

## 2022-11-03 DIAGNOSIS — F32A Depression, unspecified: Secondary | ICD-10-CM | POA: Diagnosis not present

## 2022-11-03 DIAGNOSIS — F419 Anxiety disorder, unspecified: Secondary | ICD-10-CM | POA: Diagnosis present

## 2022-11-03 LAB — COMPREHENSIVE METABOLIC PANEL
ALT: 13 U/L (ref 0–44)
ALT: 15 U/L (ref 0–44)
AST: 41 U/L (ref 15–41)
AST: 38 U/L (ref 15–41)
Albumin: 2.4 g/dL — ABNORMAL LOW (ref 3.5–5.0)
Albumin: 2.2 g/dL — ABNORMAL LOW (ref 3.5–5.0)
Alkaline Phosphatase: 80 U/L (ref 38–126)
Alkaline Phosphatase: 73 U/L (ref 38–126)
Anion gap: 10 (ref 5–15)
Anion gap: 11 (ref 5–15)
BUN: 53 mg/dL — ABNORMAL HIGH (ref 8–23)
BUN: 52 mg/dL — ABNORMAL HIGH (ref 8–23)
CO2: 21 mmol/L — ABNORMAL LOW (ref 22–32)
CO2: 21 mmol/L — ABNORMAL LOW (ref 22–32)
Calcium: 8.5 mg/dL — ABNORMAL LOW (ref 8.9–10.3)
Calcium: 8.4 mg/dL — ABNORMAL LOW (ref 8.9–10.3)
Chloride: 106 mmol/L (ref 98–111)
Chloride: 106 mmol/L (ref 98–111)
Creatinine, Ser: 2.42 mg/dL — ABNORMAL HIGH (ref 0.44–1.00)
Creatinine, Ser: 2.43 mg/dL — ABNORMAL HIGH (ref 0.44–1.00)
GFR, Estimated: 22 mL/min — ABNORMAL LOW (ref >60)
GFR, Estimated: 22 mL/min — ABNORMAL LOW (ref >60)
Glucose, Bld: 183 mg/dL — ABNORMAL HIGH (ref 70–99)
Glucose, Bld: 120 mg/dL — ABNORMAL HIGH (ref 70–99)
Potassium: 5.5 mmol/L — ABNORMAL HIGH (ref 3.5–5.1)
Potassium: 4.1 mmol/L (ref 3.5–5.1)
Sodium: 137 mmol/L (ref 135–145)
Sodium: 138 mmol/L (ref 135–145)
Total Bilirubin: 2.3 mg/dL — ABNORMAL HIGH (ref 0.3–1.2)
Total Bilirubin: 2.2 mg/dL — ABNORMAL HIGH (ref 0.3–1.2)
Total Protein: 4.4 g/dL — ABNORMAL LOW (ref 6.5–8.1)
Total Protein: 4.2 g/dL — ABNORMAL LOW (ref 6.5–8.1)

## 2022-11-03 LAB — PROTIME-INR
INR: 1.4 — ABNORMAL HIGH (ref 0.8–1.2)
Prothrombin Time: 16.8 seconds — ABNORMAL HIGH (ref 11.4–15.2)

## 2022-11-03 LAB — CBC
HCT: 12.7 % — CL (ref 36.0–46.0)
Hemoglobin: 3.6 g/dL — CL (ref 12.0–15.0)
MCH: 24 pg — ABNORMAL LOW (ref 26.0–34.0)
MCHC: 28.3 g/dL — ABNORMAL LOW (ref 30.0–36.0)
MCV: 84.7 fL (ref 80.0–100.0)
Platelets: 160 10*3/uL (ref 150–400)
RBC: 1.5 MIL/uL — ABNORMAL LOW (ref 3.87–5.11)
RDW: 24.4 % — ABNORMAL HIGH (ref 11.5–15.5)
WBC: 5.8 10*3/uL (ref 4.0–10.5)
nRBC: 0.5 % — ABNORMAL HIGH (ref 0.0–0.2)

## 2022-11-03 LAB — MAGNESIUM: Magnesium: 2 mg/dL (ref 1.7–2.4)

## 2022-11-03 LAB — TROPONIN I (HIGH SENSITIVITY)
Troponin I (High Sensitivity): 11 ng/L (ref <18)
Troponin I (High Sensitivity): 9 ng/L (ref <18)

## 2022-11-03 LAB — PREPARE RBC (CROSSMATCH)

## 2022-11-03 LAB — RESP PANEL BY RT-PCR (RSV, FLU A&B, COVID)  RVPGX2
Influenza A by PCR: NEGATIVE
Influenza B by PCR: NEGATIVE
Resp Syncytial Virus by PCR: NEGATIVE
SARS Coronavirus 2 by RT PCR: NEGATIVE

## 2022-11-03 LAB — HEMOGLOBIN AND HEMATOCRIT, BLOOD
HCT: 16.1 % — ABNORMAL LOW (ref 36.0–46.0)
Hemoglobin: 5.1 g/dL — ABNORMAL LOW (ref 12.0–15.0)

## 2022-11-03 LAB — AMMONIA: Ammonia: 28 umol/L (ref 9–35)

## 2022-11-03 LAB — T4, FREE: Free T4: 1.2 ng/dL — ABNORMAL HIGH (ref 0.61–1.12)

## 2022-11-03 LAB — TSH: TSH: 2.374 u[IU]/mL (ref 0.350–4.500)

## 2022-11-03 MED ORDER — SODIUM ZIRCONIUM CYCLOSILICATE 10 G PO PACK
10.0000 g | PACK | Freq: Once | ORAL | Status: AC
  Administered 2022-11-03: 10 g via ORAL
  Filled 2022-11-03: qty 1

## 2022-11-03 MED ORDER — SERTRALINE HCL 50 MG PO TABS
50.0000 mg | ORAL_TABLET | Freq: Every day | ORAL | Status: DC
  Administered 2022-11-04 – 2022-11-06 (×3): 50 mg via ORAL
  Filled 2022-11-03 (×3): qty 1

## 2022-11-03 MED ORDER — FUROSEMIDE 10 MG/ML IJ SOLN
20.0000 mg | Freq: Once | INTRAMUSCULAR | Status: AC
  Administered 2022-11-03: 20 mg via INTRAVENOUS
  Filled 2022-11-03: qty 2

## 2022-11-03 MED ORDER — SODIUM CHLORIDE 0.9 % IV SOLN
250.0000 mL | INTRAVENOUS | Status: DC | PRN
  Administered 2022-11-05: 1000 mL via INTRAVENOUS

## 2022-11-03 MED ORDER — SODIUM CHLORIDE 0.9% FLUSH
3.0000 mL | Freq: Two times a day (BID) | INTRAVENOUS | Status: DC
  Administered 2022-11-03 – 2022-11-06 (×7): 3 mL via INTRAVENOUS

## 2022-11-03 MED ORDER — SODIUM CHLORIDE 0.9% IV SOLUTION
Freq: Once | INTRAVENOUS | Status: AC
  Filled 2022-11-03: qty 250

## 2022-11-03 MED ORDER — FUROSEMIDE 10 MG/ML IJ SOLN: 20.0000 mg | Freq: Once | INTRAMUSCULAR | Status: DC | PRN

## 2022-11-03 MED ORDER — SODIUM CHLORIDE 0.9 % IV SOLN: Freq: Once | INTRAVENOUS | Status: AC

## 2022-11-03 MED ORDER — MIDODRINE HCL 5 MG PO TABS
10.0000 mg | ORAL_TABLET | Freq: Once | ORAL | Status: AC
  Administered 2022-11-03: 10 mg via ORAL
  Filled 2022-11-03: qty 2

## 2022-11-03 MED ORDER — ONDANSETRON HCL 4 MG/2ML IJ SOLN: 4.0000 mg | Freq: Four times a day (QID) | INTRAMUSCULAR | Status: DC | PRN

## 2022-11-03 MED ORDER — SPIRONOLACTONE 25 MG PO TABS: 25.0000 mg | ORAL_TABLET | Freq: Every day | ORAL | Status: DC

## 2022-11-03 MED ORDER — MIDODRINE HCL 5 MG PO TABS
10.0000 mg | ORAL_TABLET | Freq: Three times a day (TID) | ORAL | Status: DC
  Administered 2022-11-03 – 2022-11-06 (×6): 10 mg via ORAL
  Filled 2022-11-03 (×8): qty 2

## 2022-11-03 MED ORDER — ORAL CARE MOUTH RINSE: 15.0000 mL | OROMUCOSAL | Status: DC | PRN

## 2022-11-03 MED ORDER — SODIUM CHLORIDE 0.9% FLUSH: 3.0000 mL | INTRAVENOUS | Status: DC | PRN

## 2022-11-03 MED ORDER — PANTOPRAZOLE SODIUM 40 MG IV SOLR
40.0000 mg | Freq: Once | INTRAVENOUS | Status: AC
  Administered 2022-11-03: 40 mg via INTRAVENOUS
  Filled 2022-11-03: qty 10

## 2022-11-03 MED ORDER — ONDANSETRON HCL 4 MG PO TABS: 4.0000 mg | ORAL_TABLET | Freq: Four times a day (QID) | ORAL | Status: DC | PRN

## 2022-11-03 MED ORDER — TORSEMIDE 20 MG PO TABS
40.0000 mg | ORAL_TABLET | Freq: Every day | ORAL | Status: DC
  Administered 2022-11-03 – 2022-11-06 (×4): 40 mg via ORAL
  Filled 2022-11-03 (×4): qty 2

## 2022-11-03 NOTE — ED Provider Notes (Signed)
[facility] Provider Note    Event Date/Time   First MD Initiated Contact with Patient 11/03/22 [phone]     (approximate)   History   Weakness   HPI  Rachel Huber is a 67 y.o. female  with cirrhosis of liver, chronic anemia secondary to CKD as well as thalassemia disease who comes in with weakness.  Patient has a history of Rachel Huber cirrhosis status post TIPS procedure at [facility] 08/20/2022 and she has had post embolization of hemorrhoids on 09/05/2022  On review of records patient was hospitalized from 1/31 until 2/7 for staph bacteremia where her port was removed and she was treated with Keflex for 14 days.  She was then readmitted on 2/28 until 3/9 for AKI with a creatinine of 4.31.  Patient reports that since her discharge on the ninth she was told to not take as much MiraLAX that she has been alternating every other day which then led to a really hard bowel movement.  She reports that since then she has had some increase of her hemorrhoid bleeding and she has been feeling more weak over the past few days.  She denies any falls hitting her head, chest pain, shortness of breath.  She states that she last had fluid taken off of her belly 6 days ago and she is due tomorrow but she is going to have teeth extraction tomorrow so she will have a paracentesis done on Wednesday.  She denies any pain in her belly, fevers.  She states that she is just concerned that her hemoglobin may be low which is why she comes in today.  She reports being compliant with her diuretics and denies any new swelling in her legs.  She is on midodrine and she supposed to take a dose in the morning and she states that she has not taken that yet today.  Physical Exam   Triage Vital Signs: ED Triage Vitals  Enc Vitals Group     BP 11/03/22 0822 (!) 126/46     Pulse Rate 11/03/22 0824 94     Resp 11/03/22 0822 (!) 22     Temp 11/03/22 0824 98 F (36.7 C)     Temp src --      SpO2 11/03/22 0824 99 %      Weight 11/03/22 0825 225 lb (102.1 kg)     Height 11/03/22 0825 5\' 7"  (1.702 m)     Head Circumference --      Peak Flow --      Pain Score 11/03/22 0824 5     Pain Loc --      Pain Edu? --      Excl. in [place]? --     Most recent vital signs: Vitals:   11/03/22 0822 11/03/22 0824  BP: (!) 126/46   Pulse:  94  Resp: (!) 22   Temp:  98 F (36.7 C)  SpO2:  99%     General: Awake, no distress.  CV:  Good peripheral perfusion.  Resp:  Normal effort.  Abd:  No distention.  Soft nontender Other:  Some trace edema noted bilaterally.   ED Results / Procedures / Treatments   Labs (all labs ordered are listed, but only abnormal results are displayed) Labs Reviewed  RESP PANEL BY RT-PCR (RSV, FLU A&B, COVID)  [id number]  COMPREHENSIVE METABOLIC PANEL  CBC  PROTIME-INR  MAGNESIUM  TSH  T4, FREE  URINALYSIS, ROUTINE W REFLEX MICROSCOPIC  AMMONIA  POC OCCULT BLOOD,  ED  TYPE AND SCREEN  TROPONIN I (HIGH SENSITIVITY)     EKG  My interpretation of EKG:  Initial EKG is slightly difficult to interpret due to some artifact it does appear to be sinus with a accelerated rate of 102 without any ST elevation or T wave inversions normal intervals but will get a repeat to ensure  Repeat EKG the reading atrial fibrillation by do see some P waves with a type I AV block, no ST elevation no T wave inversions except for lead III and aVF, possibly slightly prolonged QTc   PROCEDURES:  Critical Care performed: Yes, see critical care procedure note(s)  .1-3 Lead EKG Interpretation  Performed by: [name], MD Authorized by: Vanessa Naomi, MD     Interpretation: normal     ECG rate:  90   ECG rate assessment: normal     Rhythm: sinus rhythm     Ectopy: none     Conduction: normal   .Critical Care  Performed by: Vanessa Tynan, MD Authorized by: Vanessa [place], MD   Critical care provider statement:    Critical care time (minutes):  30   Critical care was necessary to treat  or prevent imminent or life-threatening deterioration of the following conditions: symptomatic anemia.   Critical care was time spent personally by me on the following activities:  Development of treatment plan with patient or surrogate, discussions with consultants, evaluation of patient's response to treatment, examination of patient, ordering and review of laboratory studies, ordering and review of radiographic studies, ordering and performing treatments and interventions, pulse oximetry, re-evaluation of patient's condition and review of old charts    [place] ED: Medications  0.9 %  sodium chloride infusion (has no administration in time range)  midodrine (PROAMATINE) tablet 10 mg (10 mg Oral Given 11/03/22 0931)     IMPRESSION / MDM / [place] / ED COURSE  I reviewed the triage vital signs and the nursing notes.   Patient's presentation is most consistent with acute presentation with potential threat to life or bodily function.   Patient comes in with generalized weakness concern for low hemoglobins.  Labs ordered evaluate for [place] abnormalities, anemia, thyroid dysfunction, UTI, COVID, flu, ACS, elevated ammonia  CBC shows hemoglobin of 3.6.  She did have a hemoglobin of 5.35 days ago.  Does report getting 1 unit of blood at that time.  Ammonia level is normal.  COVID, flu negative, CMP shows creatinine around baseline at 2.4 with a potassium of 5.5.  INR is around her baseline at 1.4  She reports that she has been taking potassium supplementation.  Will give 1 dose of Lokelma and I did discuss with pharmacy no contraindications with her liver cirrhosis.  Discussed with GI patient  Dr Vicente Males has no black stool do not feel we need to treat for varices especially given patient is already had a TIPS procedure.  Recommended discussing with surgical team given the concern for being primary hemorrhoid.  Discussed with Dr. Lysle Pearl from surgical team who will come down  to evaluate patient.  Again no active bleeding on my evaluation.  Blood pressures are stable patient has had 2 units of blood ordered.  Will discuss with hospital team for admission  The patient is on the cardiac monitor to evaluate for evidence of arrhythmia and/or significant heart rate changes.      FINAL CLINICAL IMPRESSION(S) / ED DIAGNOSES   Final diagnoses:  Liver cirrhosis secondary to  NASH ([place])  Symptomatic anemia     Rx / DC Orders   ED Discharge Orders     None        Note:  This document was prepared using Dragon voice recognition software and may include unintentional dictation errors.   Vanessa Tovey, MD 11/03/22 [phone]

## 2022-11-03 NOTE — Assessment & Plan Note (Addendum)
Creatinine 2.42 today Appears to be at baseline Monitor renal function with PRBC transfusion Hold nephrotoxic agents

## 2022-11-03 NOTE — ED Notes (Addendum)
This RN is staying with pt first 15 min of blood transfusion while primary RN, Lattie Haw is in MRI

## 2022-11-03 NOTE — ED Notes (Signed)
Rachel Pigg, MD, made aware of critical results. Hemoglobin 3.6 and hematocrit 12.7

## 2022-11-03 NOTE — Assessment & Plan Note (Signed)
Noted bright red blood per rectum secondary to hemorrhoidal bleeding Recurring issue in the setting of baseline cirrhosis Case discussed with on-call gastroenterology as well as general surgery per EDP [place]  Not felt to be a endoscopic or surgical candidate given overall bleeding risk in the setting of cirrhosis Noted prior multiple hemorrhoidal embolizations with interventional radiology Interventional radiology PA Neysa Bonito is aware of the case as we discussed near the bedside He will touch base with interventional radiology to see if patient can have embolizations done at [facility] regional If not, may need to be transferred to [facility] for direct embolization Will otherwise monitor for now

## 2022-11-03 NOTE — Assessment & Plan Note (Signed)
Noted bright red blood per rectum secondary to hemorrhoidal bleeding Recurring issue in the setting of baseline cirrhosis Case discussed with on-call gastroenterology as well as general surgery Not felt to be a endoscopic or surgical candidate given overall bleeding risk in the setting of cirrhosis Noted prior multiple hemorrhoidal embolizations with interventional radiology Interventional radiology PA Neysa Bonito is aware of the case as we discussed near the bedside He will touch base with interventional radiology to see if patient can have embolizations done at [facility] regional If not, may need to be transferred to [facility] for direct embolization Will otherwise monitor for now

## 2022-11-03 NOTE — H&P (Addendum)
History and Physical    Patient: Rachel Huber [id number] DOB: [birth year] DOA: 11/03/2022 DOS: the patient was seen and examined on 11/03/2022 PCP: [name], MD  Patient coming from: Home  Chief Complaint:  Chief Complaint  Patient presents with   Weakness   HPI: Rachel Huber is a 67 y.o. 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  Patient noted had multiple recent admissions for different issues including admission February 28 through March 9 for acute on chronic kidney disease.  Patient also hospitalized January 31 to February 7 for staph bacteremia where her port was removed.  Patient also noted to be status post TIPS procedure [facility] on September 16, 2022.  Also with history of hemorrhoid embolization January 2024.  Per report, patient with multiple episodes of bright red blood per rectum.  Denies any abdominal pain nausea vomiting.  This has been a recurring issue.  Patient is noted bright red blood even without having a bowel movement.  Baseline history of anemia in the setting of thalassemia and chronic kidney disease.  Baseline hemoglobin around 7.  Denies any recent strenuous activity.  Has required hemorrhoid embolization in the past.  Is also noted to be on the transplant list at [facility] as patient is followed closely by [facility] hepatology as well as [facility].  No reported NSAID use. Presented to the ER afebrile, hemodynamically stable.  Labs notable for hemoglobin of 3.6, with a baseline hemoglobin around 7, ammonia level of 28.  Creatinine at 2.42.  Potassium of 5.5.  COVID flu and RSV  negative.  INR 1.4.  AST within normal limits. Review of Systems: As mentioned in the history of present illness. All other systems reviewed and are negative. Past Medical History:  Diagnosis Date   Anemia in chronic kidney disease (CKD) 02/07/2022   Anxiety    Arthritis    Cirrhosis of liver ([place])    Diabetes mellitus without complication (HCC)    Dyspnea    DOE   GERD (gastroesophageal reflux disease)    Grade IV internal hemorrhoids    Contingency plan for any future admissions for severe anemia in the setting of persistent GI bleed:  nuclear medicine tagged RBC scan to assess for location of GI bleeding - if from rectum, would then proceed to angiogram with possible repeat embolization.  Please notify IR in this case.   Hepatitis    PAST   Hypertension    Neuropathy    Neuropathy, diabetic ([place])    Pneumonia    Sinus complaint    Sleep apnea    CPAP   Thalassemia minor 1992   Past Surgical History:  Procedure Laterality Date   ABDOMINAL HYSTERECTOMY     BREAST BIOPSY Right 02/15/2018   [place] bx 6-6:30 ribbon shape, ONE CORE FRAGMENT WITH FIBROSIS. ONE CORE FRAGMENT WITH PORTION OF A DILATED   BREAST BIOPSY Right 02/15/2018   [place] bx 9:00 heart shape, USUAL DUCTAL HYPERPLASIA   BREAST LUMPECTOMY Right 03/09/2018   Procedure: BREAST LUMPECTOMY x 2;  Surgeon: Benjamine Sprague, DO;  Location: [facility] ORS;  Service: General;  Laterality: Right;   CATARACT EXTRACTION W/PHACO Left 06/11/2016   Procedure: CATARACT EXTRACTION PHACO AND INTRAOCULAR LENS  PLACEMENT (IOC);  Surgeon: Estill Cotta, MD;  Location: [facility] ORS;  Service: Ophthalmology;  Laterality: Left;  Lot # [id number] H US:01:38.6 AP%:26.4 CDE:44.15   CATARACT EXTRACTION W/PHACO Right 06/15/2018   Procedure: CATARACT EXTRACTION PHACO AND INTRAOCULAR LENS PLACEMENT (IOC);  Surgeon: Birder Robson, MD;  Location: [facility] ORS;  Service: Ophthalmology;  Laterality: Right;  [place] 00:38.2 CDE 4.23 Fluid Pack Lot # [id number]   COLONOSCOPIES      COLONOSCOPY WITH PROPOFOL N/A 10/10/2020   Procedure: COLONOSCOPY WITH PROPOFOL;  Surgeon: Lesly Rubenstein, MD;  Location: [facility] ENDOSCOPY;  Service: Endoscopy;  Laterality: N/A;   COLONOSCOPY WITH PROPOFOL N/A 11/20/2020   Procedure: COLONOSCOPY WITH PROPOFOL;  Surgeon: Lesly Rubenstein, MD;  Location: [facility] ENDOSCOPY;  Service: Endoscopy;  Laterality: N/A;  DM STAT CBC, BMP COVID POSITIVE 09/02/2020   COLONOSCOPY WITH PROPOFOL N/A 06/19/2022   Procedure: COLONOSCOPY WITH PROPOFOL;  Surgeon: Lesly Rubenstein, MD;  Location: [facility] ENDOSCOPY;  Service: Endoscopy;  Laterality: N/A;   CTR     ESOPHAGOGASTRODUODENOSCOPY (EGD) WITH PROPOFOL N/A 10/10/2020   Procedure: ESOPHAGOGASTRODUODENOSCOPY (EGD) WITH PROPOFOL;  Surgeon: Lesly Rubenstein, MD;  Location: [facility] ENDOSCOPY;  Service: Endoscopy;  Laterality: N/A;  COVID POSITIVE 10/08/2020   FLEXIBLE SIGMOIDOSCOPY N/A 02/06/2022   Procedure: FLEXIBLE SIGMOIDOSCOPY;  Surgeon: Lesly Rubenstein, MD;  Location: [facility] ENDOSCOPY;  Service: Endoscopy;  Laterality: N/A;  Patient requests anesthesia   IR ANGIOGRAM SELECTIVE EACH ADDITIONAL VESSEL  02/07/2022   IR ANGIOGRAM SELECTIVE EACH ADDITIONAL VESSEL  02/07/2022   IR ANGIOGRAM SELECTIVE EACH ADDITIONAL VESSEL  09/05/2022   IR ANGIOGRAM VISCERAL SELECTIVE  02/07/2022   IR ANGIOGRAM VISCERAL SELECTIVE  09/05/2022   IR EMBO ART  VEN HEMORR LYMPH EXTRAV  INC GUIDE ROADMAPPING  09/05/2022   IR EMBO ARTERIAL NOT HEMORR HEMANG INC GUIDE ROADMAPPING  02/07/2022   IR IMAGING GUIDED PORT INSERTION  06/13/2022   IR PARACENTESIS  12/17/2021   IR PARACENTESIS  03/25/2022   IR RADIOLOGIST EVAL & MGMT  03/18/2022   IR RADIOLOGIST EVAL & MGMT  05/07/2022   IR RADIOLOGIST EVAL & MGMT  05/16/2022   IR RADIOLOGIST EVAL & MGMT  10/13/2022   IR US GUIDE VASC ACCESS RIGHT  02/07/2022   IR US GUIDE VASC ACCESS RIGHT  09/05/2022   JOINT REPLACEMENT     KNEE SURGERY Right 09/24/2017   plates and pins   PORT-A-CATH REMOVAL N/A  09/23/2022   Procedure: MINOR REMOVAL PORT-A-CATH;  Surgeon: Aviva Signs, MD;  Location: [facility] OR[facility];  Service: General;  Laterality: N/A;   TEE WITHOUT CARDIOVERSION N/A 09/23/2022   Procedure: TRANSESOPHAGEAL ECHOCARDIOGRAM (TEE);  Surgeon: Arnoldo Lenis, MD;  Location: [facility] OR[facility];  Service: Endoscopy;  Laterality: N/A;   TOTAL SHOULDER ARTHROPLASTY Right 09/27/2015   Social History:  reports that she has never smoked. She has never used smokeless tobacco. She reports that she does not drink alcohol and does not use drugs.  Allergies  Allergen Reactions   Gramineae Pollens Other (See Comments)    Sneezing, Running nose   Latex Rash    Contact rash    Family History  Problem Relation Age of Onset   Breast cancer Mother 39   Lymphoma Mother    Diabetes Father    Kidney cancer Father    Heart disease Father    Diabetes Sister    Breast cancer Sister 69    Prior to Admission medications   Medication Sig Start Date End Date Taking? Authorizing Provider  acetaminophen (TYLENOL)  650 MG CR tablet Take 1,300 mg by mouth daily as needed for pain.    [provider]  albuterol (VENTOLIN HFA) 108 (90 Base) MCG/ACT inhaler Inhale 2 puffs into the lungs every 6 (six) hours as needed for wheezing or shortness of breath. 02/10/22   Loletha Grayer, MD  atorvastatin (LIPITOR) 10 MG tablet Take 1 tablet (10 mg total) by mouth daily. 06/20/22   Fritzi Mandes, MD  ciprofloxacin (CIPRO) 500 MG tablet Take 500 mg by mouth daily with breakfast.    [provider]  diphenhydrAMINE (BENADRYL) 25 MG tablet Take 25 mg by mouth 2 (two) times daily as needed for itching. Patient not taking: Reported on 10/30/2022    [provider]  ergocalciferol (VITAMIN D2) 1.25 MG (50000 UT) capsule Take 1 capsule (50,000 Units total) by mouth every 14 (fourteen) days. Patient not taking: Reported on 10/30/2022 09/01/22   Harriett Rush, PA-C  gabapentin (NEURONTIN) 400 MG capsule Take 800 mg by  mouth 2 (two) times daily. 01/07/18   [provider]  melatonin 5 MG TABS Take 10 mg by mouth at bedtime as needed (sleep).    [provider]  midodrine (PROAMATINE) 10 MG tablet Take 1 tablet (10 mg total) by mouth 3 (three) times daily. Take 10 mg twice daily 10/25/22 11/24/22  Tat, Shanon Brow, MD  Multiple Vitamins-Minerals (MULTIVITAMIN WITH MINERALS) tablet Take 1 tablet by mouth daily.    [provider]  oxymetazoline (AFRIN) 0.05 % nasal spray Place 1 spray into both nostrils daily as needed for congestion.    [provider]  polyethylene glycol (MIRALAX / GLYCOLAX) 17 g packet Take 17 g by mouth 2 (two) times daily. 06/20/22   Fritzi Mandes, MD  potassium chloride SA (KLOR-CON M) 20 MEQ tablet Take 1 tablet (20 mEq total) by mouth 2 (two) times daily. 10/26/22   Orson Eva, MD  sertraline (ZOLOFT) 50 MG tablet Take 50 mg by mouth daily. 12/12/17   [provider]  spironolactone (ALDACTONE) 25 MG tablet Take 1 tablet (25 mg total) by mouth daily. 10/26/22   Orson Eva, MD  torsemide 40 MG TABS Take 40 mg by mouth daily. Patient not taking: Reported on 10/30/2022 10/26/22   Orson Eva, MD  vitamin B-12 (CYANOCOBALAMIN) 1000 MCG tablet Take 1,000 mcg by mouth daily.    [provider]  witch hazel-glycerin (TUCKS) pad Apply topically as needed for hemorrhoids. 03/07/22   Loletha Grayer, MD    Physical Exam: Vitals:   11/03/22 1130 11/03/22 1134 11/03/22 1200 11/03/22 1230  BP: (!) 134/50 (!) 134/50 (!) 130/40 (!) 124/51  Pulse: 87 86 83 80  Resp: 20 (!) 21 16 14   Temp:  97.6 F (36.4 C)    TempSrc:  Oral    SpO2: 99% 98% 98% 100%  Weight:      Height:       Physical Exam Constitutional:      General: She is not in acute distress.    Appearance: She is obese.  HENT:     Head: Normocephalic and atraumatic.     Nose: Nose normal.     Mouth/Throat:     Mouth: Mucous membranes are moist.  Eyes:     Pupils: Pupils are equal, round, and  reactive to light.  Cardiovascular:     Rate and Rhythm: Normal rate and regular rhythm.  Pulmonary:     Effort: Pulmonary effort is normal.  Abdominal:     General: Abdomen is  flat. Bowel sounds are normal.  Musculoskeletal:        General: Normal range of motion.  Skin:    General: Skin is warm.     Coloration: Skin is pale.  Neurological:     General: No focal deficit present.  Psychiatric:        Mood and Affect: Mood normal.     Data Reviewed:  There are no new results to review at this time. US Paracentesis INDICATION: Refractory ascites in patient with history of NASH cirrhosis status post TIPS procedure May 2023 and TIPS revision July 2023 at [facility]. She recently underwent second TIPS revision 08/20/2022. Request received for therapeutic paracentesis with 8 L max.  EXAM: ULTRASOUND GUIDED THERAPEUTIC RIGHT LOWER QUADRANT PARACENTESIS  MEDICATIONS: 10 mL 1 % lidocaine  COMPLICATIONS: None immediate.  PROCEDURE: Informed written consent was obtained from the patient after a discussion of the risks, benefits and alternatives to treatment. A timeout was performed prior to the initiation of the procedure.  Initial ultrasound scanning demonstrates a moderate amount of ascites within the right lower abdominal quadrant. The right lower abdomen was prepped and draped in the usual sterile fashion. 1% lidocaine was used for local anesthesia.  Following this, a 19 gauge, 10-cm, Yueh catheter was introduced. An ultrasound image was saved for documentation purposes. The paracentesis was performed. The catheter was removed and a dressing was applied. The patient tolerated the procedure well without immediate post procedural complication. Patient received post-procedure intravenous albumin; see nursing notes for details.  FINDINGS: A total of approximately 5.35 L of clear, yellow fluid was removed.  IMPRESSION: Successful ultrasound-guided paracentesis yielding 5.35  L liters of peritoneal fluid.  Read by: Narda Rutherford, AGNP-BC  Electronically Signed   By: Ruthann Cancer M.D.   On: 10/28/2022 12:43  Lab Results  Component Value Date   WBC 5.8 11/03/2022   HGB 3.6 (LL) 11/03/2022   HCT 12.7 (LL) 11/03/2022   MCV 84.7 11/03/2022   PLT 160 [id number]   Last metabolic panel Lab Results  Component Value Date   GLUCOSE 183 (H) 11/03/2022   NA 137 11/03/2022   K 5.5 (H) 11/03/2022   CL 106 11/03/2022   CO2 21 (L) 11/03/2022   BUN 53 (H) 11/03/2022   CREATININE 2.42 (H) 11/03/2022   GFRNONAA 22 (L) 11/03/2022   CALCIUM 8.5 (L) 11/03/2022   PHOS 3.2 10/25/2022   PROT 4.4 (L) 11/03/2022   ALBUMIN 2.4 (L) 11/03/2022   LABGLOB 2.6 06/24/2021   AGRATIO 1.2 06/24/2021   BILITOT 2.3 (H) 11/03/2022   ALKPHOS 80 11/03/2022   AST 41 11/03/2022   ALT 13 11/03/2022   ANIONGAP 10 11/03/2022    Assessment and Plan: * BRBPR (bright red blood per rectum) Noted bright red blood per rectum secondary to hemorrhoidal bleeding Recurring issue in the setting of baseline cirrhosis Case discussed with on-call gastroenterology as well as general surgery per EDP [place]  Not felt to be a endoscopic or surgical candidate given overall bleeding risk in the setting of cirrhosis Noted prior multiple hemorrhoidal embolizations with interventional radiology Interventional radiology PA Neysa Bonito is aware of the case as we discussed near the bedside He will touch base with interventional radiology to see if patient can have embolizations done at [facility] If not, may need to be transferred to [facility] for direct embolization Will otherwise monitor for now  HLD (hyperlipidemia) Cont statin    Chronic kidney disease, stage 3b ([place]) Creatinine 2.42 today Appears to  be at baseline Monitor renal function with PRBC transfusion Hold nephrotoxic agents  Hypotension Cont midodrine    Hyperkalemia K 5.5 on presentation  Hold home potassium supplement   Hold spironolactone  S/p lokelma in the ER  EKG stable  Monitor K w/ pRBC transfusion  Trend K overnight   Acute on chronic anemia Acute on chronic blood loss in the setting of baseline thalassemia disease and CKD.  Hemoglobin 3.6 today Baseline appears to be around 7 Pending 2 unit PRBC transfusion Trend hemoglobin Monitor hemodynamics closely  Alcoholic cirrhosis of liver with ascites [facility]) Patient is followed closely by [facility] hepatology Patient planning to have teeth removed as a final requirement for liver transplant consideration Status post recent paracentesis 10/14/2022 where 8 L of fluid removed 10/21/22 paracentesis--7L Cont home lasix  Hold aldactone in setting of hyperkalemia for now  Ammonia 28  MELD score 26   Type 2 diabetes mellitus with hyperlipidemia ([place]) SSI       Advance Care Planning:   Code Status: Full Code   Consults: General Surgery and Gastroenterology per Dr. Jari Pigg in the ER   Family Communication: None   Severity of Illness: The appropriate patient status for this patient is INPATIENT. Inpatient status is judged to be reasonable and necessary in order to provide the required intensity of service to ensure the patient's safety. The patient's presenting symptoms, physical exam findings, and initial radiographic and laboratory data in the context of their chronic comorbidities is felt to place them at high risk for further clinical deterioration. Furthermore, it is not anticipated that the patient will be medically stable for discharge from the hospital within 2 midnights of admission.   * I certify that at the point of admission it is my clinical judgment that the patient will require inpatient hospital care spanning beyond 2 midnights from the point of admission due to high intensity of service, high risk for further deterioration and high frequency of surveillance required.*  Author: Deneise Lever, MD 11/03/2022 1:14 PM  For on call review  [email or web address].

## 2022-11-03 NOTE — Assessment & Plan Note (Signed)
Cont statin

## 2022-11-03 NOTE — ED Notes (Signed)
Answered call light.  Jpatient had gotten up to toilet.  Bright red blood in toilet and on floor.  She says she has a pad on right now.  Assisted to stretcher and siderails up.  Color was pale with skin warm and dry.  VS taken.  Color improved some after laying down.

## 2022-11-03 NOTE — Assessment & Plan Note (Signed)
Patient is followed closely by [facility] hepatology Patient planning to have teeth removed as a final requirement for liver transplant consideration Status post recent paracentesis 10/14/2022 where 8 L of fluid removed 10/21/22 paracentesis--7L Cont home lasix  Hold aldactone in setting of hyperkalemia for now  Ammonia 28  MELD score 26

## 2022-11-03 NOTE — Consult Note (Signed)
Subjective:  CC: BRBPR  HPI:  Rachel Huber is a 67 y.o. female who was consulted for evaluation of above. Symptoms were first noted a few days ago. Hx of previous episodes of BRBPR, noted to be hemorrhoids and requiring embolization due to her cirrhosis making it high risk to proceed with any excision.  She states recent TIPS have slowed down the amount of bleeding, but recently increased again so here for further eval.  Does notice some swelling around perianal area.   Past Medical History:  has a past medical history of Anemia in chronic kidney disease (CKD) (02/07/2022), Anxiety, Arthritis, Cirrhosis of liver ([place]), Diabetes mellitus without complication ([place]), Dyspnea, GERD (gastroesophageal reflux disease), Grade IV internal hemorrhoids, Hepatitis, Hypertension, Neuropathy, Neuropathy, diabetic ([place]), Pneumonia, Sinus complaint, Sleep apnea, and Thalassemia minor (1992).  Past Surgical History:  has a past surgical history that includes CTR; Abdominal hysterectomy; COLONOSCOPIES; Cataract extraction w/PHACO (Left, 06/11/2016); Knee surgery (Right, 09/24/2017); Total shoulder arthroplasty (Right, 09/27/2015); Breast biopsy (Right, 02/15/2018); Breast biopsy (Right, 02/15/2018); Breast lumpectomy (Right, 03/09/2018); Joint replacement; Cataract extraction w/PHACO (Right, 06/15/2018); Esophagogastroduodenoscopy (egd) with propofol (N/A, 10/10/2020); Colonoscopy with propofol (N/A, 10/10/2020); Colonoscopy with propofol (N/A, 11/20/2020); IR Paracentesis (12/17/2021); IR US Guide Vasc Access Right (02/07/2022); IR Angiogram Visceral Selective (02/07/2022); IR EMBO ARTERIAL NOT HEMORR HEMANG INC GUIDE ROADMAPPING (02/07/2022); Flexible sigmoidoscopy (N/A, 02/06/2022); IR Angiogram Selective Each Additional Vessel (02/07/2022); IR Angiogram Selective Each Additional Vessel (02/07/2022); IR Radiologist Eval & Mgmt (03/18/2022); IR Paracentesis (03/25/2022); IR Radiologist Eval & Mgmt (05/07/2022); IR Radiologist Eval & Mgmt  (05/16/2022); IR IMAGING GUIDED PORT INSERTION (06/13/2022); Colonoscopy with propofol (N/A, 06/19/2022); IR Angiogram Visceral Selective (09/05/2022); IR Angiogram Selective Each Additional Vessel (09/05/2022); IR US Guide Vasc Access Right (09/05/2022); IR EMBO ART  VEN HEMORR LYMPH EXTRAV  INC GUIDE ROADMAPPING (09/05/2022); Port-a-cath removal (N/A, 09/23/2022); TEE without cardioversion (N/A, 09/23/2022); and IR Radiologist Eval & Mgmt (10/13/2022).  Family History: family history includes Breast cancer (age of onset: 54) in her mother; Breast cancer (age of onset: 37) in her sister; Diabetes in her father and sister; Heart disease in her father; Kidney cancer in her father; Lymphoma in her mother.  Social History:  reports that she has never smoked. She has never used smokeless tobacco. She reports that she does not drink alcohol and does not use drugs.  Current Medications:  Prior to Admission medications   Medication Sig Start Date End Date Taking? Authorizing Provider  acetaminophen (TYLENOL) 650 MG CR tablet Take 1,300 mg by mouth daily as needed for pain.    [provider]  albuterol (VENTOLIN HFA) 108 (90 Base) MCG/ACT inhaler Inhale 2 puffs into the lungs every 6 (six) hours as needed for wheezing or shortness of breath. 02/10/22   Loletha Grayer, MD  atorvastatin (LIPITOR) 10 MG tablet Take 1 tablet (10 mg total) by mouth daily. 06/20/22   Fritzi Mandes, MD  ciprofloxacin (CIPRO) 500 MG tablet Take 500 mg by mouth daily with breakfast.    [provider]  diphenhydrAMINE (BENADRYL) 25 MG tablet Take 25 mg by mouth 2 (two) times daily as needed for itching. Patient not taking: Reported on 10/30/2022    [provider]  ergocalciferol (VITAMIN D2) 1.25 MG (50000 UT) capsule Take 1 capsule (50,000 Units total) by mouth every 14 (fourteen) days. Patient not taking: Reported on 10/30/2022 09/01/22   Harriett Rush, PA-C  gabapentin (NEURONTIN) 400 MG capsule Take 800 mg  by mouth 2 (two) times daily. 01/07/18  [provider]  melatonin 5 MG TABS Take 10 mg by mouth at bedtime as needed (sleep).    [provider]  midodrine (PROAMATINE) 10 MG tablet Take 1 tablet (10 mg total) by mouth 3 (three) times daily. Take 10 mg twice daily 10/25/22 11/24/22  Tat, Shanon Brow, MD  Multiple Vitamins-Minerals (MULTIVITAMIN WITH MINERALS) tablet Take 1 tablet by mouth daily.    [provider]  oxymetazoline (AFRIN) 0.05 % nasal spray Place 1 spray into both nostrils daily as needed for congestion.    [provider]  polyethylene glycol (MIRALAX / GLYCOLAX) 17 g packet Take 17 g by mouth 2 (two) times daily. 06/20/22   Fritzi Mandes, MD  potassium chloride SA (KLOR-CON M) 20 MEQ tablet Take 1 tablet (20 mEq total) by mouth 2 (two) times daily. 10/26/22   Orson Eva, MD  sertraline (ZOLOFT) 50 MG tablet Take 50 mg by mouth daily. 12/12/17   [provider]  spironolactone (ALDACTONE) 25 MG tablet Take 1 tablet (25 mg total) by mouth daily. 10/26/22   Orson Eva, MD  torsemide 40 MG TABS Take 40 mg by mouth daily. Patient not taking: Reported on 10/30/2022 10/26/22   Orson Eva, MD  vitamin B-12 (CYANOCOBALAMIN) 1000 MCG tablet Take 1,000 mcg by mouth daily.    [provider]  witch hazel-glycerin (TUCKS) pad Apply topically as needed for hemorrhoids. 03/07/22   Loletha Grayer, MD    Allergies:  Allergies as of 11/03/2022 - Review Complete 11/03/2022  Allergen Reaction Noted   Gramineae pollens Other (See Comments) 10/23/2021   Latex Rash 09/02/2016    ROS:  General: Denies weight loss, weight gain, fatigue, fevers, chills, and night sweats. Eyes: Denies blurry vision, double vision, eye pain, itchy eyes, and tearing. Ears: Denies hearing loss, earache, and ringing in ears. Nose: Denies sinus pain, congestion, infections, runny nose, and nosebleeds. Mouth/throat: Denies hoarseness, sore throat, bleeding gums, and difficulty  swallowing. Heart: Denies chest pain, palpitations, racing heart, irregular heartbeat, leg pain or swelling, and decreased activity tolerance. Respiratory: Denies breathing difficulty, shortness of breath, wheezing, cough, and sputum. GI: Denies change in appetite, heartburn, nausea, vomiting, constipation, diarrhea, and blood in stool. GU: Denies difficulty urinating, pain with urinating, urgency, frequency, blood in urine. Musculoskeletal: Denies joint stiffness, pain, swelling, muscle weakness. Skin: Denies rash, itching, mass, tumors, sores, and boils Neurologic: Denies headache, fainting, dizziness, seizures, numbness, and tingling. Psychiatric: Denies depression, anxiety, difficulty sleeping, and memory loss. Endocrine: Denies heat or cold intolerance, and increased thirst or urination. Blood/lymph: Denies easy bruising, easy bruising, and swollen glands    Objective:     BP (!) 124/51   Pulse 80   Temp 97.6 F (36.4 C) (Oral)   Resp 14   Ht 5\' 7"  (1.702 m)   Wt 102.1 kg   SpO2 100%   BMI 35.24 kg/m   Constitutional :  alert, cooperative, appears stated age, and no distress  Lymphatics/Throat::  no asymmetry, masses, or scars  Respiratory:  clear to auscultation bilaterally  Cardiovascular:  regular rate and rhythm  Gastrointestinal: soft, non-tender; bowel sounds normal; no masses,  no organomegaly.   Musculoskeletal: Steady movement  Skin: Cool and moist  Psychiatric: Normal affect, non-agitated, not confused  Genital/Rectal: Chaperone present for exam.  External exam notable for some mucosal edema but no obvious hemorrhoids. Soiled in stool, no sign of frank blood, no TTP.  No evidence of thrombosis    LABS:  N/a   RADS: N/a Assessment:  BRBPR- cause maybe variceal bleed or hemorrohids, other source of GI bleed secondary to advanced cirrhosis Plan:    Due to current comorbidities, patient is not suitable for any surgical intervention due to the high risk  of complications.  Even if hemorrhoidectomy is able to be performed, no guarantee the bleeding episodes will resolve since they could potentially be from variceal bleeds which cannot be addressed surgically.  Recommend supportive care and aggressive treatment of her cirrhosis to minimize risk of recurrence.  She currently does not have any signs of active bleeding but will need transfusions due to her extremely low hemoglobin and close monitoring.  Further follow-up and recommendations per primary team.  Surgery will sign off.  Please call with any additional questions or concerns. labs/images/medications/previous chart entries reviewed personally and relevant changes/updates noted above.

## 2022-11-03 NOTE — Assessment & Plan Note (Signed)
Acute on chronic blood loss in the setting of baseline thalassemia disease and CKD.  Hemoglobin 3.6 today Baseline appears to be around 7 Pending 2 unit PRBC transfusion Trend hemoglobin Monitor hemodynamics closely

## 2022-11-03 NOTE — Assessment & Plan Note (Signed)
SSI

## 2022-11-03 NOTE — Assessment & Plan Note (Signed)
   Cont midodrine 

## 2022-11-03 NOTE — ED Triage Notes (Signed)
Pt states she feels overall weak, usually feels like this when hgb is low, hx of low hgb noted in chart. Pt reports only bleeding from hemorrhoid. Pt also reports cirrhosis of liver. Pt speaking in small sentences

## 2022-11-03 NOTE — Assessment & Plan Note (Signed)
K 5.5 on presentation  Hold home potassium supplement  Hold spironolactone  S/p lokelma in the ER  EKG stable  Monitor K w/ pRBC transfusion  Trend K overnight

## 2022-11-03 NOTE — Assessment & Plan Note (Addendum)
Patient is followed closely by [facility] hepatology Patient planning to have teeth removed as a final requirement for liver transplant consideration Status post recent paracentesis 10/14/2022 where 8 L of fluid removed 10/21/22 paracentesis--7L Cont home lasix  Hold aldactone in setting of hyperkalemia for now  Ammonia 28

## 2022-11-04 ENCOUNTER — Encounter: Payer: Self-pay | Admitting: Family Medicine

## 2022-11-04 ENCOUNTER — Ambulatory Visit: Payer: 59

## 2022-11-04 ENCOUNTER — Other Ambulatory Visit: Payer: Self-pay | Admitting: Internal Medicine

## 2022-11-04 DIAGNOSIS — K7460 Unspecified cirrhosis of liver: Secondary | ICD-10-CM | POA: Diagnosis not present

## 2022-11-04 DIAGNOSIS — K7581 Nonalcoholic steatohepatitis (NASH): Secondary | ICD-10-CM | POA: Diagnosis not present

## 2022-11-04 DIAGNOSIS — K625 Hemorrhage of anus and rectum: Secondary | ICD-10-CM | POA: Diagnosis not present

## 2022-11-04 DIAGNOSIS — D649 Anemia, unspecified: Secondary | ICD-10-CM | POA: Diagnosis not present

## 2022-11-04 LAB — URINALYSIS, ROUTINE W REFLEX MICROSCOPIC
Bilirubin Urine: NEGATIVE
Glucose, UA: NEGATIVE mg/dL
Ketones, ur: NEGATIVE mg/dL
Nitrite: NEGATIVE
Protein, ur: NEGATIVE mg/dL
Specific Gravity, Urine: 1.008 (ref 1.005–1.030)
pH: 5 (ref 5.0–8.0)

## 2022-11-04 LAB — COMPREHENSIVE METABOLIC PANEL
ALT: 13 U/L (ref 0–44)
AST: 35 U/L (ref 15–41)
Albumin: 2.1 g/dL — ABNORMAL LOW (ref 3.5–5.0)
Alkaline Phosphatase: 65 U/L (ref 38–126)
Anion gap: 10 (ref 5–15)
BUN: 52 mg/dL — ABNORMAL HIGH (ref 8–23)
CO2: 22 mmol/L (ref 22–32)
Calcium: 8.2 mg/dL — ABNORMAL LOW (ref 8.9–10.3)
Chloride: 107 mmol/L (ref 98–111)
Creatinine, Ser: 2.27 mg/dL — ABNORMAL HIGH (ref 0.44–1.00)
GFR, Estimated: 24 mL/min — ABNORMAL LOW (ref >60)
Glucose, Bld: 140 mg/dL — ABNORMAL HIGH (ref 70–99)
Potassium: 3.8 mmol/L (ref 3.5–5.1)
Sodium: 139 mmol/L (ref 135–145)
Total Bilirubin: 2.6 mg/dL — ABNORMAL HIGH (ref 0.3–1.2)
Total Protein: 4 g/dL — ABNORMAL LOW (ref 6.5–8.1)

## 2022-11-04 LAB — HEMOGLOBIN AND HEMATOCRIT, BLOOD
HCT: 23.9 % — ABNORMAL LOW (ref 36.0–46.0)
HCT: 21.6 % — ABNORMAL LOW (ref 36.0–46.0)
HCT: 24.6 % — ABNORMAL LOW (ref 36.0–46.0)
Hemoglobin: 7.5 g/dL — ABNORMAL LOW (ref 12.0–15.0)
Hemoglobin: 7.1 g/dL — ABNORMAL LOW (ref 12.0–15.0)
Hemoglobin: 8 g/dL — ABNORMAL LOW (ref 12.0–15.0)

## 2022-11-04 MED ORDER — CEFAZOLIN SODIUM-DEXTROSE 2-4 GM/100ML-% IV SOLN
2.0000 g | INTRAVENOUS | Status: AC
  Administered 2022-11-05: 2 g via INTRAVENOUS
  Filled 2022-11-04: qty 100

## 2022-11-04 MED ORDER — VITAMIN B-12 1000 MCG PO TABS
1000.0000 ug | ORAL_TABLET | Freq: Every day | ORAL | Status: DC
  Administered 2022-11-04 – 2022-11-06 (×3): 1000 ug via ORAL
  Filled 2022-11-04 (×3): qty 1

## 2022-11-04 MED ORDER — ACETAMINOPHEN 325 MG PO TABS
650.0000 mg | ORAL_TABLET | Freq: Once | ORAL | Status: AC
  Administered 2022-11-04: 650 mg via ORAL
  Filled 2022-11-04: qty 2

## 2022-11-04 MED ORDER — ATORVASTATIN CALCIUM 20 MG PO TABS
10.0000 mg | ORAL_TABLET | Freq: Every day | ORAL | Status: DC
  Administered 2022-11-04 – 2022-11-06 (×3): 10 mg via ORAL
  Filled 2022-11-04 (×3): qty 1

## 2022-11-04 MED ORDER — ALBUTEROL SULFATE (2.5 MG/3ML) 0.083% IN NEBU: 3.0000 mL | INHALATION_SOLUTION | Freq: Four times a day (QID) | RESPIRATORY_TRACT | Status: DC | PRN

## 2022-11-04 MED ORDER — GABAPENTIN 300 MG PO CAPS
400.0000 mg | ORAL_CAPSULE | Freq: Two times a day (BID) | ORAL | Status: DC
  Administered 2022-11-04 – 2022-11-06 (×5): 400 mg via ORAL
  Filled 2022-11-04 (×5): qty 1

## 2022-11-04 MED ORDER — MELATONIN 5 MG PO TABS
10.0000 mg | ORAL_TABLET | Freq: Every evening | ORAL | Status: DC | PRN
  Administered 2022-11-05 (×2): 10 mg via ORAL
  Filled 2022-11-04 (×2): qty 2

## 2022-11-04 NOTE — TOC Initial Note (Signed)
Transition of Care [facility]) - Initial/Assessment Note    Patient Details  Name: Rachel Huber MRN: [id number] Date of Birth: 05-08-1956  Transition of Care [facility]) CM/SW Contact:    Candie Chroman, LCSW Phone Number: 11/04/2022, 11:33 AM  Clinical Narrative:   [facility] coworker completed readmission prevention screen on 3/1:  "Assessment completed with pt due to high risk readmission score. Pt reports she lives with her husband who assists her with bathing and dressing. Pt ambulates with a walker. Her husband drives her to appointments. Pt recently completed IV antibiotics at home."               Expected Discharge Plan: Home/Self Care Barriers to Discharge: Continued Medical Work up   Patient Goals and CMS Choice            Expected Discharge Plan and Services       Living arrangements for the past 2 months: Single Family Home                                      Prior Living Arrangements/Services Living arrangements for the past 2 months: Single Family Home Lives with:: Spouse Patient language and need for interpreter reviewed:: Yes        Need for Family Participation in Patient Care: Yes (Comment) Care giver support system in place?: Yes (comment) Current home services: DME Criminal Activity/Legal Involvement Pertinent to Current Situation/Hospitalization: No - Comment as needed  Activities of Daily Living Home Assistive Devices/Equipment: Cane (specify quad or straight) ADL Screening (condition at time of admission) Patient's cognitive ability adequate to safely complete daily activities?: Yes Is the patient deaf or have difficulty hearing?: No Does the patient have difficulty seeing, even when wearing glasses/contacts?: No Does the patient have difficulty concentrating, remembering, or making decisions?: No Patient able to express need for assistance with ADLs?: Yes Does the patient have difficulty dressing or bathing?: No Independently performs ADLs?: Yes  (appropriate for developmental age) Does the patient have difficulty walking or climbing stairs?: Yes Weakness of Legs: Both Weakness of Arms/Hands: None  Permission Sought/Granted                  Emotional Assessment       Orientation: : Oriented to Self, Oriented to Place, Oriented to  Time, Oriented to Situation Alcohol / Substance Use: Not Applicable Psych Involvement: No (comment)  Admission diagnosis:  BRBPR (bright red blood per rectum) [K62.5] Liver cirrhosis secondary to NASH ([place]) [K75.81, K74.60] Symptomatic anemia [D64.9] Patient Active Problem List   Diagnosis Date Noted   BRBPR (bright red blood per rectum) 11/03/2022   Acute on chronic anemia 11/03/2022   Hyperkalemia 11/03/2022   Hypotension 11/03/2022   Acute renal failure (ARF) ([place]) 10/16/2022   AKI (acute kidney injury) ([place]) 10/15/2022   Bacteremia [id number]   Alcoholic cirrhosis of liver with ascites ([place]) 09/21/2022   SIRS (systemic inflammatory response syndrome) ([place]) 09/18/2022   Sepsis ([place]) 09/18/2022   Rectal bleeding 06/17/2022   Obesity (BMI 30-39.9) 06/17/2022   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 mellitus ([place]) 06/17/2022   Grade IV internal hemorrhoids 05/16/2022   Acute on chronic blood loss anemia 03/05/2022   Thrombocytopenia ([place]) 03/05/2022   Fever 02/08/2022   Anemia in chronic kidney disease (CKD) 02/07/2022   GI bleeding 02/04/2022   Chronic kidney disease, stage 3b ([place]) 02/04/2022   Depression with anxiety  02/04/2022   Liver cirrhosis secondary to NASH ([place])    Insomnia 01/27/2022   Iron deficiency anemia due to chronic blood loss 02/13/2021   History of uterine cancer 02/13/2021   Decompensated hepatic cirrhosis ([place]) 10/25/2020   OSA on CPAP 10/25/2020   Glossitis 10/25/2020   Hyponatremia 10/08/2020   Acute respiratory failure with hypoxia ([place]) 09/10/2020   Pneumonia due to COVID-19 virus 09/08/2020   Hip fracture, right ([place]) 09/04/2020   Closed right hip  fracture ([place]) 09/02/2020   Type 2 diabetes mellitus with hyperlipidemia ([place]) 09/02/2020   Thalassemia minor    HLD (hyperlipidemia)    Acute kidney injury superimposed on CKD ([place])    Depression    Nondisplaced fracture of greater trochanter of right femur, initial encounter for closed fracture ([place])    PCP:  [name], MD Pharmacy:   [facility] [address] [place] [address] [place] [address] Phone: [phone] Fax: [phone]     Social Determinants of Health (SDOH) Social History: SDOH Screenings   Food Insecurity: No Food Insecurity (11/03/2022)  Housing: [place]  (11/03/2022)  Transportation Needs: No Transportation Needs (11/03/2022)  Utilities: Not At Risk (11/03/2022)  Alcohol Screen: Low Risk  (04/04/2022)  Depression (PHQ2-9): Low Risk  (04/04/2022)  Financial Resource Strain: Low Risk  (04/04/2022)  Physical Activity: Inactive (04/04/2022)  Social Connections: Moderately Isolated (04/04/2022)  Stress: No Stress Concern Present (04/04/2022)  Tobacco Use: Low Risk  (11/04/2022)   SDOH Interventions:     Readmission Risk Interventions    11/04/2022   11:32 AM 10/17/2022    9:17 AM 09/18/2022    2:19 PM  Readmission Risk Prevention Plan  Transportation Screening Complete Complete Complete  Medication Review Press photographer) Complete Complete Complete  PCP or Specialist appointment within 3-5 days of discharge Complete  Complete  HRI or Home Care Consult  Complete Complete  SW Recovery Care/Counseling Consult Complete Complete Complete  Palliative Care Screening Not Applicable Not Applicable Not [place] Not Applicable Not Applicable Not Applicable

## 2022-11-04 NOTE — Progress Notes (Addendum)
Progress Note    Rachel Huber  [id number] DOB: 1956-04-21  DOA: 11/03/2022 PCP: [name], MD      Brief Narrative:    Medical records reviewed and are as summarized below:  Rachel Huber is a 67 y.o. 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  She had a PICC line placed and this has subsequently been removed since completion of IV antibiotics.  Patient is status post TIPS at [facility] on 08/20/2022.  And she is status post embolization of hemorrhoids 09/05/2022. Patient is followed closely by gastroenterology and hepatology liver transplant at [facility].   She presented to the hospital because of generalized weakness and bleeding per rectum.  Rectal bleeding has been a recurrent problem for her.  She was found to have severe anemia with hemoglobin of 3.6.  Baseline hemoglobin is usually between 7 and 8.      Assessment/Plan:   Principal Problem:   BRBPR (bright red blood per rectum) Active Problems:   HLD (hyperlipidemia)   Liver cirrhosis secondary to NASH (HCC)   Chronic kidney disease, stage 3b (HCC)   Type 2 diabetes mellitus with hyperlipidemia (HCC)   Acute on chronic anemia   Hyperkalemia   Hypotension    Body mass index is 35.24 kg/m.   Recurrent rectal bleeding with history of embolization of hemorrhoids on 09/05/2022: Plan for hemorrhoidal embolization by interventional radiologist tomorrow.  She was evaluated by general surgeon but she is not a surgical candidate because of multiple  comorbidities.  She will be NPO after midnight.   Acute blood loss anemia, anemia of chronic disease, beta thalassemia minor: Hemoglobin up to 7.1 s/p transfusion with 2 units of packed red blood cells.   CKD stage IV, hyperkalemia: Improved s/p Lokelma.  Spironolactone and potassium chloride have been held.   Liver cirrhosis from NASH, ascites: Aldactone held because of hyperkalemia.  Continue Lasix.  Outpatient follow-up with [facility] hepatologist. She requires regular paracentesis.  S/p paracentesis on 10/21/2022 with removal of 7 L of fluid.    Diet Order             Diet NPO time specified Except for: Sips with Meds  Diet effective midnight           Diet Heart Room service appropriate? Yes; Fluid consistency: Thin  Diet effective now                            Consultants: General surgeon Interventional radiologist  Procedures: Plan for embolization of hemorrhoids tomorrow    Medications:    atorvastatin  10 mg Oral Daily   cyanocobalamin  1,000 mcg Oral Daily   gabapentin  400 mg Oral BID   midodrine  10 mg Oral TID   sertraline  50 mg Oral Daily   sodium chloride flush  3 mL Intravenous Q12H   torsemide  40 mg Oral Daily   Continuous Infusions:  sodium chloride     [START ON 11/05/2022]  ceFAZolin (ANCEF) IV       Anti-infectives (From admission, onward)  Start     Dose/Rate Route Frequency Ordered Stop   11/05/22 0000  ceFAZolin (ANCEF) IVPB 2g/100 mL premix       Note to Pharmacy: Please do not give on the floor, to be given prior to IR procedure 3/20   2 g 200 mL/hr over 30 Minutes Intravenous To Radiology 11/04/22 0931 11/06/22 0000              Family Communication/Anticipated D/C date and plan/Code Status   DVT prophylaxis: Place TED hose Start: 11/03/22 1900 SCDs Start: 11/03/22 1251     Code Status: Full Code  Family Communication: None Disposition Plan: Plan to discharge home in 2 to 3 days   Status is:  Inpatient Remains inpatient appropriate because: Rectal bleeding with severe anemia       Subjective:   Interval events noted.  She feels better today.  She had some bloody stools today.  No abdominal pain, vomiting or hematemesis.  General weakness is better.  No dizziness, shortness of breath or chest pain  Objective:    Vitals:   11/04/22 0153 11/04/22 0645 11/04/22 0800 11/04/22 1214  BP: (!) 121/34 105/64 (!) 120/49 (!) 123/44  Pulse: 76 79 76 77  Resp: 16 18 18 18   Temp: 98.5 F (36.9 C) (!) 97.5 F (36.4 C) 97.9 F (36.6 C) 97.7 F (36.5 C)  TempSrc: Oral  Oral   SpO2: 99% 100% 100% 100%  Weight:      Height:       No data found.   Intake/Output Summary (Last 24 hours) at 11/04/2022 1311 Last data filed at 11/04/2022 1104 Gross per 24 hour  Intake 1884.51 ml  Output 1300 ml  Net 584.51 ml   Filed Weights   11/03/22 0825  Weight: 102.1 kg    Exam:  GEN: NAD SKIN: Warm and dry EYES: EOMI ENT: MMM CV: RRR PULM: CTA B ABD: soft, obese, NT, +BS CNS: AAO x 3, non focal EXT: No edema or tenderness.       Data Reviewed:   I have personally reviewed following labs and imaging studies:  Labs: Labs show the following:   Basic Metabolic Panel: Recent Labs  Lab 11/03/22 0827 11/03/22 0838 11/03/22 1833 11/04/22 0432  NA 137  --  138 139  K 5.5*  --  4.1 3.8  CL 106  --  106 107  CO2 21*  --  21* 22  GLUCOSE 183*  --  120* 140*  BUN 53*  --  52* 52*  CREATININE 2.42*  --  2.43* 2.27*  CALCIUM 8.5*  --  8.4* 8.2*  MG  --  2.0  --   --    GFR Estimated Creatinine Clearance: 30.8 mL/min (A) (by C-G formula based on SCr of 2.27 mg/dL (H)). Liver Function Tests: Recent Labs  Lab 11/03/22 0827 11/03/22 1833 11/04/22 0432  AST 41 38 35  ALT 13 15 13   ALKPHOS 80 73 65  BILITOT 2.3* 2.2* 2.6*  PROT 4.4* 4.2* 4.0*  ALBUMIN 2.4* 2.2* 2.1*   No results for input(s): "LIPASE", "AMYLASE" in the last 168 hours. Recent Labs  Lab  11/03/22 0838  AMMONIA 28   Coagulation profile Recent Labs  Lab 11/03/22 0827  INR 1.4*    CBC: Recent Labs  Lab 10/29/22 1118 11/03/22 0827 11/03/22 1618 11/04/22 0304 11/04/22 0744  WBC 3.5* 5.8  --   --   --   NEUTROABS 2.4  --   --   --   --  HGB 5.3* 3.6* 5.1* 7.5* 7.1*  HCT 17.3* 12.7* 16.1* 23.9* 21.6*  MCV 77.6* 84.7  --   --   --   PLT 94* 160  --   --   --    Cardiac Enzymes: No results for input(s): "CKTOTAL", "CKMB", "CKMBINDEX", "TROPONINI" in the last 168 hours. BNP (last 3 results) No results for input(s): "PROBNP" in the last 8760 hours. CBG: No results for input(s): "GLUCAP" in the last 168 hours. D-Dimer: No results for input(s): "DDIMER" in the last 72 hours. Hgb A1c: No results for input(s): "HGBA1C" in the last 72 hours. Lipid Profile: No results for input(s): "CHOL", "HDL", "LDLCALC", "TRIG", "CHOLHDL", "LDLDIRECT" in the last 72 hours. Thyroid function studies: Recent Labs    11/03/22 0838  TSH 2.374   Anemia work up: No results for input(s): "VITAMINB12", "FOLATE", "FERRITIN", "TIBC", "IRON", "RETICCTPCT" in the last 72 hours. Sepsis Labs: Recent Labs  Lab 10/29/22 1118 11/03/22 0827  WBC 3.5* 5.8    Microbiology Recent Results (from the past 240 hour(s))  Resp panel by RT-PCR (RSV, Flu A&B, Covid) Anterior Nasal Swab     Status: None   Collection Time: 11/03/22  8:38 AM   Specimen: Anterior Nasal Swab  Result Value Ref Range Status   SARS Coronavirus 2 by RT PCR NEGATIVE NEGATIVE Final    Comment: (NOTE) SARS-CoV-2 target nucleic acids are NOT DETECTED.  The SARS-CoV-2 RNA is generally detectable in upper respiratory specimens during the acute phase of infection. The lowest concentration of SARS-CoV-2 viral copies this assay can detect is 138 copies/mL. A negative result does not preclude SARS-Cov-2 infection and should not be used as the sole basis for treatment or other patient management decisions. A negative result  may occur with  improper specimen collection/handling, submission of specimen other than nasopharyngeal swab, presence of viral mutation(s) within the areas targeted by this assay, and inadequate number of viral copies(<138 copies/mL). A negative result must be combined with clinical observations, patient history, and epidemiological information. The expected result is Negative.  Fact Sheet for Patients:  EntrepreneurPulse.com.au  Fact Sheet for Healthcare Providers:  IncredibleEmployment.be  This test is no t yet approved or cleared by the [place] FDA and  has been authorized for detection and/or diagnosis of SARS-CoV-2 by FDA under an Emergency Use Authorization (EUA). This EUA will remain  in effect (meaning this test can be used) for the duration of the COVID-19 declaration under Section 564(b)(1) of the Act, 21 U.S.C.section 360bbb-3(b)(1), unless the authorization is terminated  or revoked sooner.       Influenza A by PCR NEGATIVE NEGATIVE Final   Influenza B by PCR NEGATIVE NEGATIVE Final    Comment: (NOTE) The Xpert Xpress SARS-CoV-2/FLU/RSV plus assay is intended as an aid in the diagnosis of influenza from Nasopharyngeal swab specimens and should not be used as a sole basis for treatment. Nasal washings and aspirates are unacceptable for Xpert Xpress SARS-CoV-2/FLU/RSV testing.  Fact Sheet for Patients: EntrepreneurPulse.com.au  Fact Sheet for Healthcare Providers: IncredibleEmployment.be  This test is not yet approved or cleared by the [place] FDA and has been authorized for detection and/or diagnosis of SARS-CoV-2 by FDA under an Emergency Use Authorization (EUA). This EUA will remain in effect (meaning this test can be used) for the duration of the COVID-19 declaration under Section 564(b)(1) of the Act, 21 U.S.C. section 360bbb-3(b)(1), unless the authorization is terminated  or revoked.     Resp Syncytial Virus by PCR NEGATIVE NEGATIVE Final  Comment: (NOTE) Fact Sheet for Patients: EntrepreneurPulse.com.au  Fact Sheet for Healthcare Providers: IncredibleEmployment.be  This test is not yet approved or cleared by the [place] FDA and has been authorized for detection and/or diagnosis of SARS-CoV-2 by FDA under an Emergency Use Authorization (EUA). This EUA will remain in effect (meaning this test can be used) for the duration of the COVID-19 declaration under Section 564(b)(1) of the Act, 21 U.S.C. section 360bbb-3(b)(1), unless the authorization is terminated or revoked.  Performed at [facility], [address] [place], [place] [address]     Procedures and diagnostic studies:  No results found.             LOS: 1 day   Cassadie Pankonin  [facility]   Pager on [email or web address]. If 7PM-7AM, please contact night-coverage at [email or web address]     11/04/2022, 1:11 PM

## 2022-11-05 ENCOUNTER — Inpatient Hospital Stay: Payer: 59 | Admitting: Radiology

## 2022-11-05 ENCOUNTER — Ambulatory Visit: Payer: 59

## 2022-11-05 ENCOUNTER — Inpatient Hospital Stay: Payer: 59

## 2022-11-05 DIAGNOSIS — K7581 Nonalcoholic steatohepatitis (NASH): Secondary | ICD-10-CM | POA: Diagnosis not present

## 2022-11-05 DIAGNOSIS — K625 Hemorrhage of anus and rectum: Secondary | ICD-10-CM | POA: Diagnosis not present

## 2022-11-05 DIAGNOSIS — K7460 Unspecified cirrhosis of liver: Secondary | ICD-10-CM | POA: Diagnosis not present

## 2022-11-05 DIAGNOSIS — D649 Anemia, unspecified: Secondary | ICD-10-CM | POA: Diagnosis not present

## 2022-11-05 HISTORY — PX: IR EMBO ART  VEN HEMORR LYMPH EXTRAV  INC GUIDE ROADMAPPING: IMG5450

## 2022-11-05 LAB — CBC WITH DIFFERENTIAL/PLATELET
Abs Immature Granulocytes: 0.01 10*3/uL (ref 0.00–0.07)
Basophils Absolute: 0 10*3/uL (ref 0.0–0.1)
Basophils Relative: 0 %
Eosinophils Absolute: 0.2 10*3/uL (ref 0.0–0.5)
Eosinophils Relative: 6 %
HCT: 21.5 % — ABNORMAL LOW (ref 36.0–46.0)
Hemoglobin: 6.8 g/dL — ABNORMAL LOW (ref 12.0–15.0)
Immature Granulocytes: 0 %
Lymphocytes Relative: 24 %
Lymphs Abs: 0.8 10*3/uL (ref 0.7–4.0)
MCH: 26.9 pg (ref 26.0–34.0)
MCHC: 31.6 g/dL (ref 30.0–36.0)
MCV: 85 fL (ref 80.0–100.0)
Monocytes Absolute: 0.2 10*3/uL (ref 0.1–1.0)
Monocytes Relative: 7 %
Neutro Abs: 1.9 10*3/uL (ref 1.7–7.7)
Neutrophils Relative %: 63 %
Platelets: 84 10*3/uL — ABNORMAL LOW (ref 150–400)
RBC: 2.53 MIL/uL — ABNORMAL LOW (ref 3.87–5.11)
RDW: 18.2 % — ABNORMAL HIGH (ref 11.5–15.5)
WBC: 3.1 10*3/uL — ABNORMAL LOW (ref 4.0–10.5)
nRBC: 0 % (ref 0.0–0.2)

## 2022-11-05 LAB — PREPARE RBC (CROSSMATCH)

## 2022-11-05 MED ORDER — SPIRONOLACTONE 25 MG PO TABS
25.0000 mg | ORAL_TABLET | Freq: Every day | ORAL | Status: DC
  Administered 2022-11-05 – 2022-11-06 (×2): 25 mg via ORAL
  Filled 2022-11-05 (×2): qty 1

## 2022-11-05 MED ORDER — MIDAZOLAM HCL 5 MG/5ML IJ SOLN
INTRAMUSCULAR | Status: AC | PRN
  Administered 2022-11-05 (×2): .5 mg via INTRAVENOUS

## 2022-11-05 MED ORDER — MIDAZOLAM HCL 2 MG/2ML IJ SOLN
INTRAMUSCULAR | Status: AC | PRN
  Administered 2022-11-05: 1 mg via INTRAVENOUS

## 2022-11-05 MED ORDER — CEFAZOLIN SODIUM-DEXTROSE 2-4 GM/100ML-% IV SOLN
INTRAVENOUS | Status: AC
  Filled 2022-11-05: qty 100

## 2022-11-05 MED ORDER — SODIUM CHLORIDE 0.9% IV SOLUTION: Freq: Once | INTRAVENOUS | Status: AC

## 2022-11-05 MED ORDER — LIDOCAINE HCL 1 % IJ SOLN
INTRAMUSCULAR | Status: AC
  Administered 2022-11-05: 5 mL
  Filled 2022-11-05: qty 20

## 2022-11-05 MED ORDER — ACETAMINOPHEN 325 MG PO TABS
650.0000 mg | ORAL_TABLET | Freq: Three times a day (TID) | ORAL | Status: DC | PRN
  Administered 2022-11-05 – 2022-11-06 (×2): 650 mg via ORAL
  Filled 2022-11-05 (×2): qty 2

## 2022-11-05 MED ORDER — CEFAZOLIN SODIUM-DEXTROSE 2-4 GM/100ML-% IV SOLN
INTRAVENOUS | Status: AC | PRN
  Administered 2022-11-05: 2 g via INTRAVENOUS

## 2022-11-05 MED ORDER — MIDAZOLAM HCL 2 MG/2ML IJ SOLN
INTRAMUSCULAR | Status: AC
  Filled 2022-11-05: qty 2

## 2022-11-05 MED ORDER — IODIXANOL 320 MG/ML IV SOLN
80.0000 mL | Freq: Once | INTRAVENOUS | Status: AC | PRN
  Administered 2022-11-05: 80 mL via INTRA_ARTERIAL

## 2022-11-05 MED ORDER — FENTANYL CITRATE (PF) 100 MCG/2ML IJ SOLN
INTRAMUSCULAR | Status: AC | PRN
  Administered 2022-11-05: 50 ug via INTRAVENOUS
  Administered 2022-11-05 (×2): 25 ug via INTRAVENOUS

## 2022-11-05 MED ORDER — FENTANYL CITRATE (PF) 100 MCG/2ML IJ SOLN
INTRAMUSCULAR | Status: AC
  Filled 2022-11-05: qty 2

## 2022-11-05 NOTE — Progress Notes (Addendum)
Progress Note    QUINCEY ROBBINSON  [id number] DOB: [birth year]  DOA: 11/03/2022 PCP: [name], MD      Brief Narrative:    Medical records reviewed and are as summarized below:  NATASSJA SALMEN is a 67 y.o. 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  She had a PICC line placed and this has subsequently been removed since completion of IV antibiotics.  Patient is status post TIPS at [facility] on 08/20/2022.  And she is status post embolization of hemorrhoids 09/05/2022. Patient is followed closely by gastroenterology and hepatology liver transplant at [facility].   She presented to the hospital because of generalized weakness and bleeding per rectum.  Rectal bleeding has been a recurrent problem for her.  She was found to have severe anemia with hemoglobin of 3.6.  Baseline hemoglobin is usually between 7 and 8.      Assessment/Plan:   Principal Problem:   BRBPR (bright red blood per rectum) Active Problems:   HLD (hyperlipidemia)   Liver cirrhosis secondary to NASH (HCC)   Chronic kidney disease, stage 3b (HCC)   Type 2 diabetes mellitus with hyperlipidemia (HCC)   Acute on chronic anemia   Hyperkalemia   Hypotension    Body mass index is 35.24 kg/m.   Recurrent rectal bleeding with history of embolization of hemorrhoids on 09/05/2022: Plan for hemorrhoidal embolization by interventional radiologist today.  She was evaluated by general surgeon but she is not a surgical candidate because of multiple  comorbidities.    Acute blood loss anemia, anemia of chronic disease, beta thalassemia minor: Hemoglobin dropped to 6.8 today.  Transfuse 1 unit of PRBCs. S/p transfusion with 2 units of packed red blood cells.  Hemoglobin was 3.6 on admission.   CKD stage IV, hyperkalemia: Improved s/p Lokelma.  Spironolactone and potassium chloride were held on admission.   Liver cirrhosis from NASH, ascites: Restart spironolactone.  Continue torsemide..  Outpatient follow-up with [facility] hepatologist. She requires regular paracentesis.  S/p paracentesis on 10/21/2022 with removal of 7 L of fluid.    Diet Order             Diet NPO time specified Except for: Sips with Meds  Diet effective midnight                            Consultants: General surgeon Interventional radiologist  Procedures: Plan for embolization of hemorrhoids today    Medications:    atorvastatin  10 mg Oral Daily   cyanocobalamin  1,000 mcg Oral Daily   gabapentin  400 mg Oral BID   midodrine  10 mg Oral TID   sertraline  50 mg Oral Daily   sodium chloride flush  3 mL Intravenous Q12H   spironolactone  25 mg Oral Daily   torsemide  40 mg Oral Daily   Continuous Infusions:  sodium chloride       Anti-infectives (From admission, onward)    Start     Dose/Rate Route Frequency Ordered Stop   11/05/22 0000  ceFAZolin (ANCEF) IVPB 2g/100 mL  premix       Note to Pharmacy: Please do not give on the floor, to be given prior to IR procedure 3/20   2 g 200 mL/hr over 30 Minutes Intravenous To Radiology 11/04/22 0931 11/05/22 0116              Family Communication/Anticipated D/C date and plan/Code Status   DVT prophylaxis: Place TED hose Start: 11/03/22 1900 SCDs Start: 11/03/22 1251     Code Status: Full Code  Family Communication: None Disposition Plan: Plan to discharge home in 2 to 3 days   Status is: Inpatient Remains inpatient appropriate because: Rectal bleeding with severe  anemia       Subjective:   Interval events noted.  No rectal bleeding last night and this morning.  Objective:    Vitals:   11/05/22 0513 11/05/22 0750 11/05/22 0803 11/05/22 0830  BP: (!) 111/52 (!) 121/46 (!) 112/46 (!) 102/47  Pulse: 64 84 83 79  Resp: 20 16 18 16   Temp: 98.7 F (37.1 C) 98 F (36.7 C) 98 F (36.7 C) 98.1 F (36.7 C)  TempSrc:      SpO2: 98% 100% 98% 99%  Weight:      Height:       No data found.   Intake/Output Summary (Last 24 hours) at 11/05/2022 1047 Last data filed at 11/05/2022 0214 Gross per 24 hour  Intake 480 ml  Output 1100 ml  Net -620 ml   Filed Weights   11/03/22 0825  Weight: 102.1 kg    Exam:  GEN: NAD SKIN: No rash EYES: Pale, anicteric ENT: MMM CV: RRR PULM: CTA B ABD: soft, obese, NT, +BS CNS: AAO x 3, non focal EXT: No edema or tenderness        Data Reviewed:   I have personally reviewed following labs and imaging studies:  Labs: Labs show the following:   Basic Metabolic Panel: Recent Labs  Lab 11/03/22 0827 11/03/22 0838 11/03/22 1833 11/04/22 0432  NA 137  --  138 139  K 5.5*  --  4.1 3.8  CL 106  --  106 107  CO2 21*  --  21* 22  GLUCOSE 183*  --  120* 140*  BUN 53*  --  52* 52*  CREATININE 2.42*  --  2.43* 2.27*  CALCIUM 8.5*  --  8.4* 8.2*  MG  --  2.0  --   --    GFR Estimated Creatinine Clearance: 30.8 mL/min (A) (by C-G formula based on SCr of 2.27 mg/dL (H)). Liver Function Tests: Recent Labs  Lab 11/03/22 0827 11/03/22 1833 11/04/22 0432  AST 41 38 35  ALT 13 15 13   ALKPHOS 80 73 65  BILITOT 2.3* 2.2* 2.6*  PROT 4.4* 4.2* 4.0*  ALBUMIN 2.4* 2.2* 2.1*   No results for input(s): "LIPASE", "AMYLASE" in the last 168 hours. Recent Labs  Lab 11/03/22 0838  AMMONIA 28   Coagulation profile Recent Labs  Lab 11/03/22 0827  INR 1.4*    CBC: Recent Labs  Lab 10/29/22 1118 11/03/22 0827 11/03/22 1618 11/04/22 0304 11/04/22 0744 11/04/22 1333 11/05/22 0516   WBC 3.5* 5.8  --   --   --   --  3.1*  NEUTROABS 2.4  --   --   --   --   --  1.9  HGB 5.3* 3.6* 5.1* 7.5* 7.1* 8.0* 6.8*  HCT 17.3* 12.7* 16.1* 23.9* 21.6* 24.6* 21.5*  MCV 77.6* 84.7  --   --   --   --  85.0  PLT 94* 160  --   --   --   --  84*   Cardiac Enzymes: No results for input(s): "CKTOTAL", "CKMB", "CKMBINDEX", "TROPONINI" in the last 168 hours. BNP (last 3 results) No results for input(s): "PROBNP" in the last 8760 hours. CBG: No results for input(s): "GLUCAP" in the last 168 hours. D-Dimer: No results for input(s): "DDIMER" in the last 72 hours. Hgb A1c: No results for input(s): "HGBA1C" in the last 72 hours. Lipid Profile: No results for input(s): "CHOL", "HDL", "LDLCALC", "TRIG", "CHOLHDL", "LDLDIRECT" in the last 72 hours. Thyroid function studies: Recent Labs    11/03/22 0838  TSH 2.374   Anemia work up: No results for input(s): "VITAMINB12", "FOLATE", "FERRITIN", "TIBC", "IRON", "RETICCTPCT" in the last 72 hours. Sepsis Labs: Recent Labs  Lab 10/29/22 1118 11/03/22 0827 11/05/22 0516  WBC 3.5* 5.8 3.1*    Microbiology Recent Results (from the past 240 hour(s))  Resp panel by RT-PCR (RSV, Flu A&B, Covid) Anterior Nasal Swab     Status: None   Collection Time: 11/03/22  8:38 AM   Specimen: Anterior Nasal Swab  Result Value Ref Range Status   SARS Coronavirus 2 by RT PCR NEGATIVE NEGATIVE Final    Comment: (NOTE) SARS-CoV-2 target nucleic acids are NOT DETECTED.  The SARS-CoV-2 RNA is generally detectable in upper respiratory specimens during the acute phase of infection. The lowest concentration of SARS-CoV-2 viral copies this assay can detect is 138 copies/mL. A negative result does not preclude SARS-Cov-2 infection and should not be used as the sole basis for treatment or other patient management decisions. A negative result may occur with  improper specimen collection/handling, submission of specimen other than nasopharyngeal swab, presence of  viral mutation(s) within the areas targeted by this assay, and inadequate number of viral copies(<138 copies/mL). A negative result must be combined with clinical observations, patient history, and epidemiological information. The expected result is Negative.  Fact Sheet for Patients:  EntrepreneurPulse.com.au  Fact Sheet for Healthcare Providers:  IncredibleEmployment.be  This test is no t yet approved or cleared by the [place] FDA and  has been authorized for detection and/or diagnosis of SARS-CoV-2 by FDA under an Emergency Use Authorization (EUA). This EUA will remain  in effect (meaning this test can be used) for the duration of the COVID-19 declaration under Section 564(b)(1) of the Act, 21 U.S.C.section 360bbb-3(b)(1), unless the authorization is terminated  or revoked sooner.       Influenza A by PCR NEGATIVE NEGATIVE Final   Influenza B by PCR NEGATIVE NEGATIVE Final    Comment: (NOTE) The Xpert Xpress SARS-CoV-2/FLU/RSV plus assay is intended as an aid in the diagnosis of influenza from Nasopharyngeal swab specimens and should not be used as a sole basis for treatment. Nasal washings and aspirates are unacceptable for Xpert Xpress SARS-CoV-2/FLU/RSV testing.  Fact Sheet for Patients: EntrepreneurPulse.com.au  Fact Sheet for Healthcare Providers: IncredibleEmployment.be  This test is not yet approved or cleared by the [place] FDA and has been authorized for detection and/or diagnosis of SARS-CoV-2 by FDA under an Emergency Use Authorization (EUA). This EUA will remain in effect (meaning this test can be used) for the duration of the COVID-19 declaration under Section 564(b)(1) of the Act, 21 U.S.C. section 360bbb-3(b)(1), unless the authorization is terminated or revoked.     Resp Syncytial Virus by PCR NEGATIVE NEGATIVE Final    Comment: (NOTE) Fact Sheet for  Patients: EntrepreneurPulse.com.au  Fact Sheet for Healthcare Providers: IncredibleEmployment.be  This test is not yet approved or cleared by the [place] and has been authorized for detection and/or diagnosis of SARS-CoV-2 by FDA under an Emergency Use Authorization (EUA). This EUA will remain in effect (meaning this test can be used) for the duration of the COVID-19 declaration under Section 564(b)(1) of the Act, 21 U.S.C. section 360bbb-3(b)(1), unless the authorization is terminated or revoked.  Performed at [facility], [address] [place], [place] [address]     Procedures and diagnostic studies:  No results found.             LOS: 2 days   Leitha Hyppolite  [facility]   Pager on [email or web address]. If 7PM-7AM, please contact night-coverage at [email or web address]     11/05/2022, 10:47 AM

## 2022-11-05 NOTE — H&P (Signed)
Chief Complaint: Patient was seen in consultation today for symptomatic hemorrhoids with associated anemia  Referring Physician(s): Shanda Howells, MD  Supervising Physician: Jacqulynn Cadet  Patient Status: [facility] - In-pt  History of Present Illness: Rachel Huber is a 67 y.o. 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 She presented to [facility] ED on 11/03/22 with fatigue, hemoglobin was subsequently found to be 3.6. Patient has received several blood transfusions since that time and continues to endorse bloody bowel movements while in the hospital. IR was contacted when patient presented to [facility] ED due to originally being scheduled for image-guided port placement on 3/18.   Past Medical History:  Diagnosis Date   Anemia in chronic kidney disease (CKD) 02/07/2022   Anxiety    Arthritis    Cirrhosis of liver ([place])    Diabetes mellitus without complication (HCC)    Dyspnea    DOE   GERD (gastroesophageal reflux disease)    Grade IV internal hemorrhoids    Contingency plan for any future admissions for severe anemia in the setting of persistent GI bleed:  nuclear medicine tagged RBC scan to assess for location of GI bleeding - if from rectum, would then proceed to angiogram with possible repeat embolization.  Please notify IR in this case.   Hepatitis    PAST   Hypertension    Neuropathy    Neuropathy, diabetic ([place])    Pneumonia    Sinus complaint    Sleep apnea    CPAP   Thalassemia minor 1992    Past Surgical History:  Procedure Laterality Date   ABDOMINAL HYSTERECTOMY     BREAST BIOPSY Right 02/15/2018   [place] bx 6-6:30  ribbon shape, ONE CORE FRAGMENT WITH FIBROSIS. ONE CORE FRAGMENT WITH PORTION OF A DILATED   BREAST BIOPSY Right 02/15/2018   [place] bx 9:00 heart shape, USUAL DUCTAL HYPERPLASIA   BREAST LUMPECTOMY Right 03/09/2018   Procedure: BREAST LUMPECTOMY x 2;  Surgeon: Benjamine Sprague, DO;  Location: [facility] ORS;  Service: General;  Laterality: Right;   CATARACT EXTRACTION W/PHACO Left 06/11/2016   Procedure: CATARACT EXTRACTION PHACO AND INTRAOCULAR LENS PLACEMENT (IOC);  Surgeon: Estill Cotta, MD;  Location: [facility] ORS;  Service: Ophthalmology;  Laterality: Left;  Lot # [id number] H US:01:38.6 AP%:26.4 CDE:44.15   CATARACT EXTRACTION W/PHACO Right 06/15/2018   Procedure: CATARACT EXTRACTION PHACO AND INTRAOCULAR LENS PLACEMENT (IOC);  Surgeon: Birder Robson, MD;  Location: [facility] ORS;  Service: Ophthalmology;  Laterality: Right;  [place] 00:38.2 CDE 4.23 Fluid Pack Lot # [id number]   COLONOSCOPIES     COLONOSCOPY WITH PROPOFOL N/A 10/10/2020   Procedure: COLONOSCOPY WITH PROPOFOL;  Surgeon: Lesly Rubenstein, MD;  Location: [facility] ENDOSCOPY;  Service: Endoscopy;  Laterality: N/A;   COLONOSCOPY WITH PROPOFOL N/A 11/20/2020   Procedure: COLONOSCOPY WITH PROPOFOL;  Surgeon: Lesly Rubenstein, MD;  Location: [facility] ENDOSCOPY;  Service: Endoscopy;  Laterality: N/A;  DM STAT CBC, BMP COVID POSITIVE 09/02/2020   COLONOSCOPY WITH PROPOFOL N/A 06/19/2022   Procedure: COLONOSCOPY WITH PROPOFOL;  Surgeon: Lesly Rubenstein, MD;  Location: [facility] ENDOSCOPY;  Service: Endoscopy;  Laterality: N/A;   CTR     ESOPHAGOGASTRODUODENOSCOPY (EGD) WITH PROPOFOL N/A 10/10/2020   Procedure: ESOPHAGOGASTRODUODENOSCOPY (EGD) WITH PROPOFOL;  Surgeon: Haig Prophet,  Hilton Cork, MD;  Location: [facility] ENDOSCOPY;  Service: Endoscopy;  Laterality: N/A;  COVID POSITIVE 10/08/2020   FLEXIBLE SIGMOIDOSCOPY N/A 02/06/2022   Procedure: FLEXIBLE SIGMOIDOSCOPY;  Surgeon: Lesly Rubenstein, MD;  Location: [facility] ENDOSCOPY;  Service: Endoscopy;  Laterality: N/A;   Patient requests anesthesia   IR ANGIOGRAM SELECTIVE EACH ADDITIONAL VESSEL  02/07/2022   IR ANGIOGRAM SELECTIVE EACH ADDITIONAL VESSEL  02/07/2022   IR ANGIOGRAM SELECTIVE EACH ADDITIONAL VESSEL  09/05/2022   IR ANGIOGRAM VISCERAL SELECTIVE  02/07/2022   IR ANGIOGRAM VISCERAL SELECTIVE  09/05/2022   IR EMBO ART  VEN HEMORR LYMPH EXTRAV  INC GUIDE ROADMAPPING  09/05/2022   IR EMBO ARTERIAL NOT HEMORR HEMANG INC GUIDE ROADMAPPING  02/07/2022   IR IMAGING GUIDED PORT INSERTION  06/13/2022   IR PARACENTESIS  12/17/2021   IR PARACENTESIS  03/25/2022   IR RADIOLOGIST EVAL & MGMT  03/18/2022   IR RADIOLOGIST EVAL & MGMT  05/07/2022   IR RADIOLOGIST EVAL & MGMT  05/16/2022   IR RADIOLOGIST EVAL & MGMT  10/13/2022   IR US GUIDE VASC ACCESS RIGHT  02/07/2022   IR US GUIDE VASC ACCESS RIGHT  09/05/2022   JOINT REPLACEMENT     KNEE SURGERY Right 09/24/2017   plates and pins   PORT-A-CATH REMOVAL N/A 09/23/2022   Procedure: MINOR REMOVAL PORT-A-CATH;  Surgeon: Aviva Signs, MD;  Location: [facility] ORS;  Service: General;  Laterality: N/A;   TEE WITHOUT CARDIOVERSION N/A 09/23/2022   Procedure: TRANSESOPHAGEAL ECHOCARDIOGRAM (TEE);  Surgeon: Arnoldo Lenis, MD;  Location: [facility] ORS;  Service: Endoscopy;  Laterality: N/A;   TOTAL SHOULDER ARTHROPLASTY Right 09/27/2015    Allergies: Gramineae pollens and Latex  Medications: Prior to Admission medications   Medication Sig Start Date End Date Taking? Authorizing Provider  acetaminophen (TYLENOL) 650 MG CR tablet Take 1,300 mg by mouth daily as needed for pain.   Yes [provider]  albuterol (VENTOLIN HFA) 108 (90 Base) MCG/ACT inhaler Inhale 2 puffs into the lungs every 6 (six) hours as needed for wheezing or shortness of breath. 02/10/22  Yes Wieting, Richard, MD  atorvastatin (LIPITOR) 10 MG tablet Take 1 tablet (10 mg total) by mouth daily. 06/20/22  Yes Fritzi Mandes, MD  ciprofloxacin (CIPRO) 500 MG tablet Take 500 mg by mouth daily with breakfast.   Yes  [provider]  gabapentin (NEURONTIN) 400 MG capsule Take 400 mg by mouth 2 (two) times daily. 01/07/18  Yes [provider]  melatonin 5 MG TABS Take 10 mg by mouth at bedtime as needed (sleep).   Yes [provider]  midodrine (PROAMATINE) 10 MG tablet Take 1 tablet (10 mg total) by mouth 3 (three) times daily. Take 10 mg twice daily 10/25/22 11/24/22 Yes Tat, Shanon Brow, MD  Multiple Vitamins-Minerals (MULTIVITAMIN WITH MINERALS) tablet Take 1 tablet by mouth daily.   Yes [provider]  polyethylene glycol (MIRALAX / GLYCOLAX) 17 g packet Take 17 g by mouth 2 (two) times daily. 06/20/22  Yes Fritzi Mandes, MD  potassium chloride SA (KLOR-CON M) 20 MEQ tablet Take 1 tablet (20 mEq total) by mouth 2 (two) times daily. 10/26/22  Yes Tat, Shanon Brow, MD  sertraline (ZOLOFT) 50 MG tablet Take 50 mg by mouth daily. 12/12/17  Yes [provider]  spironolactone (ALDACTONE) 25 MG tablet Take 1 tablet (25 mg total) by mouth daily. 10/26/22  Yes Tat, Shanon Brow, MD  torsemide 40 MG TABS Take 40 mg by mouth daily. 10/26/22  Yes TatShanon Brow,  MD  vitamin B-12 (CYANOCOBALAMIN) 1000 MCG tablet Take 1,000 mcg by mouth daily.   Yes [provider]  witch hazel-glycerin (TUCKS) pad Apply topically as needed for hemorrhoids. 03/07/22  Yes Wieting, Richard, MD  diphenhydrAMINE (BENADRYL) 25 MG tablet Take 25 mg by mouth 2 (two) times daily as needed for itching.    [provider]  ergocalciferol (VITAMIN D2) 1.25 MG (50000 UT) capsule Take 1 capsule (50,000 Units total) by mouth every 14 (fourteen) days. 09/01/22   Harriett Rush, PA-C  oxymetazoline (AFRIN) 0.05 % nasal spray Place 1 spray into both nostrils daily as needed for congestion.    [provider]     Family History  Problem Relation Age of Onset   Breast cancer Mother 37   Lymphoma Mother    Diabetes Father    Kidney cancer Father    Heart disease Father    Diabetes Sister    Breast cancer  Sister 64    Social History   Socioeconomic History   Marital status: Married    Spouse name: Not on file   Number of children: 0   Years of education: Not on file   Highest education level: Not on file  Occupational History   Occupation: EMPLOYED  Tobacco Use   Smoking status: Never   Smokeless tobacco: Never  Vaping Use   Vaping Use: Never used  Substance and Sexual Activity   Alcohol use: No   Drug use: Never   Sexual activity: Not Currently  Other Topics Concern   Not on file  Social History Narrative   Not on file   Social Determinants of Health   Financial Resource Strain: Low Risk  (04/04/2022)   Overall Financial Resource Strain (CARDIA)    Difficulty of Paying Living Expenses: Not hard at all  Food Insecurity: No Food Insecurity (11/03/2022)   Hunger Vital Sign    Worried About Running Out of Food in the Last Year: Never true    [place] in the Last Year: Never true  Transportation Needs: No Transportation Needs (11/03/2022)   PRAPARE - Hydrologist (Medical): No    Lack of Transportation (Non-Medical): No  Physical Activity: Inactive (04/04/2022)   Exercise Vital Sign    Days of Exercise per Week: 0 days    Minutes of Exercise per Session: 0 min  Stress: No Stress Concern Present (04/04/2022)   [place]    Feeling of Stress : Only a little  Social Connections: Moderately Isolated (04/04/2022)   Social Connection and Isolation Panel [NHANES]    Frequency of Communication with Friends and Family: More than three times a week    Frequency of Social Gatherings with Friends and Family: Three times a week    Attends Religious Services: Never    Active Member of Clubs or Organizations: No    Attends Archivist Meetings: Never    Marital Status: Married    Code Status: Full code  Review of Systems: A 12 point ROS discussed and pertinent positives are  indicated in the HPI above.  All other systems are negative.  Review of Systems  Constitutional:  Positive for fatigue. Negative for chills and fever.  Respiratory:  Negative for chest tightness and shortness of breath.   Cardiovascular:  Positive for leg swelling. Negative for chest pain.  Gastrointestinal:  Positive for anal bleeding and blood in stool. Negative for abdominal pain,  diarrhea, nausea and vomiting.  Neurological:  Positive for dizziness. Negative for headaches.  Psychiatric/Behavioral:  Negative for confusion.     Vital Signs: BP (!) 102/47 (BP Location: Left Arm)   Pulse 79   Temp 98.1 F (36.7 C)   Resp 16   Ht 5\' 7"  (1.702 m)   Wt 225 lb (102.1 kg)   SpO2 99%   BMI 35.24 kg/m     Physical Exam Vitals reviewed.  Constitutional:      General: She is not in acute distress.    Appearance: She is ill-appearing.  HENT:     Mouth/Throat:     Mouth: Mucous membranes are moist.  Cardiovascular:     Rate and Rhythm: Normal rate and regular rhythm.     Pulses: Normal pulses.     Heart sounds: Normal heart sounds.  Pulmonary:     Effort: Pulmonary effort is normal.     Breath sounds: Normal breath sounds.  Abdominal:     General: Bowel sounds are normal.     Palpations: Abdomen is soft.     Tenderness: There is no abdominal tenderness.  Musculoskeletal:     Right lower leg: Edema present.     Left lower leg: Edema present.  Skin:    General: Skin is warm and dry.     Coloration: Skin is pale.  Neurological:     Mental Status: She is alert and oriented to person, place, and time.  Psychiatric:        Mood and Affect: Mood normal.        Behavior: Behavior normal.        Thought Content: Thought content normal.        Judgment: Judgment normal.     Imaging: US Paracentesis  Result Date: 10/28/2022 INDICATION: Refractory ascites in patient with history of NASH cirrhosis status post TIPS procedure May 2023 and TIPS revision July 2023 at [facility]. She  recently underwent second TIPS revision 08/20/2022. Request received for therapeutic paracentesis with 8 L max. EXAM: ULTRASOUND GUIDED THERAPEUTIC RIGHT LOWER QUADRANT PARACENTESIS MEDICATIONS: 10 mL 1 % lidocaine COMPLICATIONS: None immediate. PROCEDURE: Informed written consent was obtained from the patient after a discussion of the risks, benefits and alternatives to treatment. A timeout was performed prior to the initiation of the procedure. Initial ultrasound scanning demonstrates a moderate amount of ascites within the right lower abdominal quadrant. The right lower abdomen was prepped and draped in the usual sterile fashion. 1% lidocaine was used for local anesthesia. Following this, a 19 gauge, 10-cm, Yueh catheter was introduced. An ultrasound image was saved for documentation purposes. The paracentesis was performed. The catheter was removed and a dressing was applied. The patient tolerated the procedure well without immediate post procedural complication. Patient received post-procedure intravenous albumin; see nursing notes for details. FINDINGS: A total of approximately 5.35 L of clear, yellow fluid was removed. IMPRESSION: Successful ultrasound-guided paracentesis yielding 5.35 L liters of peritoneal fluid. Read by: Narda Rutherford, AGNP-BC Electronically Signed   By: Ruthann Cancer M.D.   On: 10/28/2022 12:43   US Paracentesis  Result Date: 10/21/2022 INDICATION: Ascites. EXAM: ULTRASOUND GUIDED  PARACENTESIS MEDICATIONS: None. COMPLICATIONS: None immediate. PROCEDURE: Informed written consent was obtained from the patient after a discussion of the risks, benefits and alternatives to treatment. A timeout was performed prior to the initiation of the procedure. Initial ultrasound scanning demonstrates a large amount of ascites within the right lower abdominal quadrant. The right lower abdomen was prepped and draped  in the usual sterile fashion. 1% lidocaine was used for local anesthesia. Following this,  a centesis catheter was introduced. The paracentesis was performed. The catheter was removed and a dressing was applied. The patient tolerated the procedure well without immediate post procedural complication. FINDINGS: A total of approximately 7 L of straw-colored fluid was removed. IMPRESSION: Successful ultrasound-guided paracentesis yielding 7 liters of peritoneal fluid. Electronically Signed   By: Franki Cabot M.D.   On: 10/21/2022 10:58   [place] ASCITES (ABDOMEN LIMITED)  Result Date: 10/20/2022 CLINICAL DATA:  History cirrhosis for evaluation of ascites status post paracentesis on 10/14/2018 EXAM: LIMITED ABDOMEN ULTRASOUND FOR ASCITES TECHNIQUE: Limited ultrasound survey for ascites was performed in all four abdominal quadrants. COMPARISON:  Renal ultrasound dated 10/16/2022, ultrasound paracentesis dated 10/14/2022 FINDINGS: Interval reaccumulation of large volume ascites, similar to slightly larger volume compared to preprocedural images on 10/14/2022. IMPRESSION: Interval reaccumulation of large volume ascites, similar to slightly larger volume compared to preprocedural images on 10/14/2022. Electronically Signed   By: Darrin Nipper M.D.   On: 10/20/2022 14:53   US RENAL  Result Date: 10/16/2022 CLINICAL DATA:  Impaired renal function EXAM: RENAL / URINARY TRACT ULTRASOUND COMPLETE COMPARISON:  None Available. FINDINGS: Right Kidney: Length: 13.0 cm. Echogenicity within normal limits. No mass or hydronephrosis visualized. Left Kidney: Length: 11.4 cm. Echogenicity within normal limits. No mass or hydronephrosis visualized. Bladder: Appears normal for degree of bladder distention. Other: Note is made of large ascites in the lower abdomen. IMPRESSION: Unremarkable examination of the kidneys. Large ascites noted incidentally. Electronically Signed   By: Sammie Bench M.D.   On: 10/16/2022 10:23   US Paracentesis  Result Date: 10/14/2022 INDICATION: Refractory ascites in patient with history of NASH  cirrhosis status post TIPS procedure May 2023 and TIPS revision July 2023 at [facility]. She recently underwent second TIPS revision 08/20/2022. Request received for therapeutic paracentesis with 8 L max. EXAM: ULTRASOUND GUIDED THERAPEUTIC RIGHT UPPER QUADRANT PARACENTESIS MEDICATIONS: 10 mL 1 % lidocaine COMPLICATIONS: None immediate. PROCEDURE: Informed written consent was obtained from the patient after a discussion of the risks, benefits and alternatives to treatment. A timeout was performed prior to the initiation of the procedure. Initial ultrasound scanning demonstrates a moderate amount of ascites within the right upper abdominal quadrant. The right upper abdomen was prepped and draped in the usual sterile fashion. 1% lidocaine was used for local anesthesia. Following this, a 19 gauge, 10-cm, Yueh catheter was introduced. An ultrasound image was saved for documentation purposes. The paracentesis was performed. The catheter was removed and a dressing was applied. The patient tolerated the procedure well without immediate post procedural complication. Patient received post-procedure intravenous albumin; see nursing notes for details. FINDINGS: A total of approximately 8 L of clear, yellow fluid was removed. IMPRESSION: Successful ultrasound-guided paracentesis yielding 8 liters of peritoneal fluid. Read by: Narda Rutherford, AGNP-BC Electronically Signed   By: Ruthann Cancer M.D.   On: 10/14/2022 13:49   IR Radiologist Eval & Mgmt  Result Date: 10/13/2022 EXAM: ESTABLISHED PATIENT OFFICE VISIT CHIEF COMPLAINT: See Epic note. HISTORY OF PRESENT ILLNESS: See Epic note. REVIEW OF SYSTEMS: See Epic note. PHYSICAL EXAMINATION: See Epic note. ASSESSMENT AND PLAN: See Epic note. Ruthann Cancer, MD [facility] Electronically Signed   By: Ruthann Cancer M.D.   On: 10/13/2022 13:43   US Paracentesis  Result Date: 10/08/2022 INDICATION: Refractory ascites in patient  with history of NASH cirrhosis status post TIPS procedure May 2023 and  TIPS revision July 2023 at [facility]. She recently underwent a second TIPS revision 08/20/2022. Request received for therapeutic paracentesis. EXAM: ULTRASOUND GUIDED therapeutic left lower quadrant PARACENTESIS MEDICATIONS: 10 cc 1% lidocaine COMPLICATIONS: None immediate. PROCEDURE: Informed written consent was obtained from the patient after a discussion of the risks, benefits and alternatives to treatment. A timeout was performed prior to the initiation of the procedure. Initial ultrasound scanning demonstrates a large amount of ascites within the left lower abdominal quadrant. The left lower abdomen was prepped and draped in the usual sterile fashion. 1% lidocaine was used for local anesthesia. Following this, a 19 gauge, 10-cm, Yueh catheter was introduced. An ultrasound image was saved for documentation purposes. The paracentesis was performed. The catheter was removed and a dressing was applied. The patient tolerated the procedure well without immediate post procedural complication. Patient received post-procedure intravenous albumin; see nursing notes for details. FINDINGS: A total of approximately 6 L of yellow fluid was removed. IMPRESSION: Successful ultrasound-guided paracentesis yielding 6 liters of peritoneal fluid. PLAN: Patient has management of TIPS done by [facility] hospital. Read by: Reatha Armour, PA-C Electronically Signed   By: Miachel Roux M.D.   On: 10/08/2022 12:20    Labs:  CBC: Recent Labs    10/24/22 0423 10/29/22 1118 11/03/22 0827 11/03/22 1618 11/04/22 0304 11/04/22 0744 11/04/22 1333 11/05/22 0516  WBC 2.9* 3.5* 5.8  --   --   --   --  3.1*  HGB 7.0* 5.3* 3.6*   < > 7.5* 7.1* 8.0* 6.8*  HCT 23.0* 17.3* 12.7*   < > 23.9* 21.6* 24.6* 21.5*  PLT 74* 94* 160  --   --   --   --  84*   < > = values in this interval not displayed.    COAGS: Recent Labs    03/22/22 0206 06/20/22 0626 09/05/22 1108  09/17/22 2111 09/18/22 1253 09/19/22 0359 11/03/22 0827  INR 1.3*   < > 1.3* 1.4* 1.4*  --  1.4*  APTT 30  --   --  34 35 36  --    < > = values in this interval not displayed.    BMP: Recent Labs    10/25/22 0505 11/03/22 0827 11/03/22 1833 11/04/22 0432  NA 140 137 138 139  K 4.2 5.5* 4.1 3.8  CL 105 106 106 107  CO2 27 21* 21* 22  GLUCOSE 103* 183* 120* 140*  BUN 49* 53* 52* 52*  CALCIUM 8.2* 8.5* 8.4* 8.2*  CREATININE 2.32* 2.42* 2.43* 2.27*  GFRNONAA 23* 22* 22* 24*    LIVER FUNCTION TESTS: Recent Labs    10/17/22 0429 10/17/22 0430 10/25/22 0505 11/03/22 0827 11/03/22 1833 11/04/22 0432  BILITOT 1.5*  --   --  2.3* 2.2* 2.6*  AST 47*  --   --  41 38 35  ALT 8  --   --  13 15 13   ALKPHOS 79  --   --  80 73 65  PROT 4.8*  --   --  4.4* 4.2* 4.0*  ALBUMIN 2.8*   < > 2.8* 2.4* 2.2* 2.1*   < > = values in this interval not displayed.    TUMOR MARKERS: No results for input(s): "AFPTM", "CEA", "CA199", "CHROMGRNA" in the last 8760 hours.  Assessment and Plan:  Rachel Huber is a 67 yo female known to IR from prior rectal artery embolizations on 02/07/22 and 09/05/22 being seen today in relation to symptomatic hemorrhoidal bleeding causing anemia. After discussion with  the patient, she endorses significant hemorrhoidal bleeding occurring since last week. Case reviewed with Dr Laurence Ferrari and approved for image-guided rectal arteriogram with bland particle embolization.   Risks and benefits of image-guided rectal arteriogram and bland particle embolization were discussed with the patient including, but not limited to bleeding, infection, damage to adjacent structures, contrast-induced renal injury, and recurrence of hemorrhoidal bleeding in the future.   All of the questions were answered and there is agreement to proceed.  Consent signed and in chart.   Thank you for this interesting consult.  I greatly enjoyed meeting Rachel Huber and look forward to  participating in their care.  A copy of this report was sent to the requesting provider on this date.  Electronically Signed: Lura Em, PA-C 11/05/2022, 9:24 AM   I spent a total of 80 [place]  in face to face in clinical consultation, greater than 50% of which was counseling/coordinating care for hemorrhoidal bleeding with associated anemia.

## 2022-11-05 NOTE — Progress Notes (Signed)
Mobility Specialist - Progress Note   11/05/22 1339  Mobility  Activity Off unit   Pt off unit during attempt. Will re attempt at another date and time.   Gretchen Short  Mobility Specialist  11/05/22 1:40 PM

## 2022-11-05 NOTE — Progress Notes (Signed)
Patient clinically stable post embolization per Dr Laurence Ferrari, tolerated well. Vitals stable pre and post procedure. Received Versed 2 mg along with Fentanyl 100 mcg IV for procedure. Awake/alert and oriented post procedure. Right groin without bleeding nor hematoma, denies complaints of pain post procedure. Report given to Denice Paradise RN post procedure/recovery/128. Update given to husband at bedside.

## 2022-11-05 NOTE — Procedures (Signed)
Interventional Radiology Procedure Note  Procedure: Angiogram with embolization of distal recanalized branch of the superior rectal artery.    Complications: None  Estimated Blood Loss: None  Recommendations:  - Continue to trend H&H and transfuse as needed   Signed,  Criselda Peaches, MD

## 2022-11-06 ENCOUNTER — Inpatient Hospital Stay: Payer: 59

## 2022-11-06 DIAGNOSIS — K7460 Unspecified cirrhosis of liver: Secondary | ICD-10-CM | POA: Diagnosis not present

## 2022-11-06 DIAGNOSIS — K7581 Nonalcoholic steatohepatitis (NASH): Secondary | ICD-10-CM | POA: Diagnosis not present

## 2022-11-06 DIAGNOSIS — E876 Hypokalemia: Secondary | ICD-10-CM | POA: Diagnosis not present

## 2022-11-06 DIAGNOSIS — K625 Hemorrhage of anus and rectum: Secondary | ICD-10-CM | POA: Diagnosis not present

## 2022-11-06 DIAGNOSIS — D649 Anemia, unspecified: Secondary | ICD-10-CM | POA: Diagnosis not present

## 2022-11-06 LAB — CBC WITH DIFFERENTIAL/PLATELET
Abs Immature Granulocytes: 0.01 10*3/uL (ref 0.00–0.07)
Basophils Absolute: 0 10*3/uL (ref 0.0–0.1)
Basophils Relative: 0 %
Eosinophils Absolute: 0.2 10*3/uL (ref 0.0–0.5)
Eosinophils Relative: 8 %
HCT: 25 % — ABNORMAL LOW (ref 36.0–46.0)
Hemoglobin: 8 g/dL — ABNORMAL LOW (ref 12.0–15.0)
Immature Granulocytes: 0 %
Lymphocytes Relative: 27 %
Lymphs Abs: 0.7 10*3/uL (ref 0.7–4.0)
MCH: 26.9 pg (ref 26.0–34.0)
MCHC: 32 g/dL (ref 30.0–36.0)
MCV: 84.2 fL (ref 80.0–100.0)
Monocytes Absolute: 0.2 10*3/uL (ref 0.1–1.0)
Monocytes Relative: 9 %
Neutro Abs: 1.4 10*3/uL — ABNORMAL LOW (ref 1.7–7.7)
Neutrophils Relative %: 56 %
Platelets: 62 10*3/uL — ABNORMAL LOW (ref 150–400)
RBC: 2.97 MIL/uL — ABNORMAL LOW (ref 3.87–5.11)
RDW: 18.4 % — ABNORMAL HIGH (ref 11.5–15.5)
WBC: 2.6 10*3/uL — ABNORMAL LOW (ref 4.0–10.5)
nRBC: 0 % (ref 0.0–0.2)

## 2022-11-06 LAB — BASIC METABOLIC PANEL
Anion gap: 10 (ref 5–15)
BUN: 47 mg/dL — ABNORMAL HIGH (ref 8–23)
CO2: 22 mmol/L (ref 22–32)
Calcium: 7.8 mg/dL — ABNORMAL LOW (ref 8.9–10.3)
Chloride: 108 mmol/L (ref 98–111)
Creatinine, Ser: 2.12 mg/dL — ABNORMAL HIGH (ref 0.44–1.00)
GFR, Estimated: 26 mL/min — ABNORMAL LOW (ref >60)
Glucose, Bld: 175 mg/dL — ABNORMAL HIGH (ref 70–99)
Potassium: 3.2 mmol/L — ABNORMAL LOW (ref 3.5–5.1)
Sodium: 140 mmol/L (ref 135–145)

## 2022-11-06 MED ORDER — POTASSIUM CHLORIDE CRYS ER 20 MEQ PO TBCR
20.0000 meq | EXTENDED_RELEASE_TABLET | Freq: Once | ORAL | Status: AC
  Administered 2022-11-06: 20 meq via ORAL
  Filled 2022-11-06: qty 1

## 2022-11-06 MED ORDER — POTASSIUM CHLORIDE CRYS ER 20 MEQ PO TBCR: 40.0000 meq | EXTENDED_RELEASE_TABLET | Freq: Once | ORAL | Status: DC

## 2022-11-06 MED ORDER — SODIUM CHLORIDE 0.9% IV SOLUTION: Freq: Once | INTRAVENOUS | Status: DC

## 2022-11-06 NOTE — Progress Notes (Signed)
Mobility Specialist - Progress Note   11/06/22 1039  Mobility  Activity Ambulated with assistance in hallway;Stood at bedside;Dangled on edge of bed  Level of Assistance Standby assist, set-up cues, supervision of patient - no hands on  Assistive Device Front wheel walker  Distance Ambulated (ft) 340 ft  Activity Response Tolerated well  Mobility Referral Yes  $Mobility charge 1 Mobility   Pt supine in bed on RA upon arrival. Pt completes bed mobility indep. Pt STS from heightened bed height and ambulates in hallway SBA with no LOB noted. Pt left in bathroom with education on how to call for assistance.   Rachel Huber  Mobility Specialist  11/06/22 10:41 AM

## 2022-11-06 NOTE — Discharge Summary (Addendum)
Physician Discharge Summary   Patient: Rachel Huber MRN: [id number] DOB: 02-05-1956  Admit date:     11/03/2022  Discharge date: 11/06/22  Discharge Physician: Jennye Boroughs   PCP: [name], MD   Recommendations at discharge:   Follow-up with PCP in 1 week  Discharge Diagnoses: Principal Problem:   BRBPR (bright red blood per rectum) Active Problems:   HLD (hyperlipidemia)   Liver cirrhosis secondary to NASH (HCC)   Chronic kidney disease, stage 3b (HCC)   Type 2 diabetes mellitus with hyperlipidemia (HCC)   Acute on chronic anemia   Hyperkalemia   Hypotension   Hypokalemia  Resolved Problems:   * No resolved hospital problems. [facility] Course:  [name] is a 67 y.o. 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  She had a PICC line placed and this has subsequently been removed since completion of IV antibiotics.  Patient is status post TIPS at [facility] on 08/20/2022.  And she is status post embolization of hemorrhoids 09/05/2022. Patient is followed closely by gastroenterology and hepatology liver transplant at [facility].    She presented to the hospital because of generalized weakness and bleeding per rectum.  Rectal bleeding has been a recurrent problem for her.  She was found to have severe anemia with hemoglobin of 3.6.  Baseline hemoglobin is usually between 7 and 8.     Assessment and Plan:  Recurrent rectal bleeding with history of embolization of hemorrhoids on 09/05/2022: S/p angiogram  with embolization of distal recanalized branch of the superior rectal artery on 11/05/2022.  There were no complications from the procedure.  Rectal bleeding has improved significantly.  She had rectal bleeding in the previous night but no rectal bleeding on the day of discharge.  Outpatient follow-up with Dr. Serafina Royals, interventional radiologist as previously scheduled      Acute blood loss anemia, anemia of chronic disease, beta thalassemia minor: Hemoglobin is 8.  S/p transfusion with 3 units of packed red blood cells.  S/p transfusion with 3 units of packed red blood cells.  Hemoglobin was 3.6 on admission.     CKD stage IV, hyperkalemia: Improved s/p Lokelma.   Hypokalemia: Replete potassium and continue potassium supplement at discharge    Liver cirrhosis from NASH, ascites: Continue torsemide and spironolactone.    Outpatient follow-up with [facility] hepatologist. She requires regular paracentesis.  S/p paracentesis on 10/21/2022 with removal of 7 L of fluid.         Consultants: General surgeon, interventional radiologist Procedures performed: Angiogram with embolization of distal recanalized branch of the superior rectal artery Disposition: Home Diet recommendation:  Discharge Diet Orders (From admission, onward)     Start     Ordered   11/06/22 0000  Diet - low sodium heart healthy        11/06/22 1237           Cardiac diet DISCHARGE MEDICATION: Allergies as of 11/06/2022       Reactions   Gramineae Pollens Other (See Comments)   Sneezing, Running nose   Latex Rash   Contact rash  Medication List     TAKE these medications    acetaminophen 650 MG CR tablet Commonly known as: TYLENOL Take 1,300 mg by mouth daily as needed for pain.   albuterol 108 (90 Base) MCG/ACT inhaler Commonly known as: VENTOLIN HFA Inhale 2 puffs into the lungs every 6 (six) hours as needed for wheezing or shortness of breath.   atorvastatin 10 MG tablet Commonly known as:  LIPITOR Take 1 tablet (10 mg total) by mouth daily.   ciprofloxacin 500 MG tablet Commonly known as: CIPRO Take 500 mg by mouth daily with breakfast.   cyanocobalamin 1000 MCG tablet Commonly known as: VITAMIN B12 Take 1,000 mcg by mouth daily.   diphenhydrAMINE 25 MG tablet Commonly known as: BENADRYL Take 25 mg by mouth 2 (two) times daily as needed for itching.   ergocalciferol 1.25 MG (50000 UT) capsule Commonly known as: VITAMIN D2 Take 1 capsule (50,000 Units total) by mouth every 14 (fourteen) days.   gabapentin 400 MG capsule Commonly known as: NEURONTIN Take 400 mg by mouth 2 (two) times daily.   melatonin 5 MG Tabs Take 10 mg by mouth at bedtime as needed (sleep).   midodrine 10 MG tablet Commonly known as: PROAMATINE Take 1 tablet (10 mg total) by mouth 3 (three) times daily. Take 10 mg twice daily   multivitamin with minerals tablet Take 1 tablet by mouth daily.   oxymetazoline 0.05 % nasal spray Commonly known as: AFRIN Place 1 spray into both nostrils daily as needed for congestion.   polyethylene glycol 17 g packet Commonly known as: MIRALAX / GLYCOLAX Take 17 g by mouth 2 (two) times daily.   potassium chloride SA 20 MEQ tablet Commonly known as: KLOR-CON M Take 1 tablet (20 mEq total) by mouth 2 (two) times daily.   sertraline 50 MG tablet Commonly known as: ZOLOFT Take 50 mg by mouth daily.   spironolactone 25 MG tablet Commonly known as: ALDACTONE Take 1 tablet (25 mg total) by mouth daily.   Torsemide 40 MG Tabs Take 40 mg by mouth daily.   witch hazel-glycerin pad Commonly known as: TUCKS Apply topically as needed for hemorrhoids.        Follow-up Information     Suttle, Rosanne Ashing, MD Follow up.   Specialties: Interventional Radiology, Diagnostic Radiology, Radiology Why: Our office will call you for phone follow-up with Dr. Serafina Royals in 1 month Contact information: [address] [place]  [address] [phone]                Discharge Exam: [name] Weights   11/03/22 0825  Weight: 102.1 kg   GEN: NAD SKIN: Warm and dry EYES: No pallor or icterus ENT: MMM CV: RRR PULM: CTA B ABD: soft, obese, NT, +BS CNS: AAO x 3, non focal EXT: No edema or tenderness   Condition at discharge: good  The results of significant diagnostics from this hospitalization (including imaging, microbiology, ancillary and laboratory) are listed below for reference.   Imaging Studies: IR EMBO ART  VEN HEMORR LYMPH EXTRAV  INC GUIDE ROADMAPPING  Result Date: 11/05/2022 INDICATION: Complicated 67 year old female with a history of NASH cirrhosis and portal hypertension status post tips creation with recurrent hemorrhagic internal hemorrhoids. She has undergone prior superior rectal artery embolization on 02/07/2022 with repeat embolization on 09/05/2022 which has worked well for several months. Unfortunately, she is readmitted with significant rectal bleeding. She presents today as an inpatient for repeat mesenteric angiography and possible additional embolization  of recanalized superior rectal artery branches. EXAM: IR EMBO ART  VEN HEMORR LYMPH EXTRAV  INC GUIDE ROADMAPPING 1. Ultrasound access right common femoral artery 2. Catheterization of the internal mesenteric artery with arteriogram 3. Catheterization of the superior rectal artery with arteriogram 4. Catheterization of recanalized branch of the superior rectal artery with arteriogram 5. Coil embolization MEDICATIONS: Ancef 2 g. The antibiotic was administered within 1 hour of the procedure ANESTHESIA/SEDATION: Moderate (conscious) sedation was employed during this procedure. A total of Versed 2 mg and Fentanyl 100 mcg was administered intravenously. Moderate Sedation Time: 46 minutes. The patient's level of consciousness and vital signs were monitored continuously by radiology nursing throughout the procedure under my direct supervision.  CONTRAST:  69mL VISIPAQUE IODIXANOL 320 MG/ML IV SOLN FLUOROSCOPY: Radiation Exposure Index (as provided by the fluoroscopic device): 123XX123 mGy Kerma COMPLICATIONS: None immediate. PROCEDURE: Informed consent was obtained from the patient following explanation of the procedure, risks, benefits and alternatives. The patient understands, agrees and consents for the procedure. All questions were addressed. A time out was performed prior to the initiation of the procedure. Maximal barrier sterile technique utilized including caps, mask, sterile gowns, sterile gloves, large sterile drape, hand hygiene, and Betadine prep. The right common femoral artery was interrogated with ultrasound and found to be widely patent. An image was obtained and stored for the medical record. Local anesthesia was attained by infiltration with 1% lidocaine. A small dermatotomy was made. Under real-time sonographic guidance, the vessel was punctured with a 21 gauge micropuncture needle. Using standard technique, the initial micro needle was exchanged over a 0.018 micro wire for a transitional 4 [place] micro sheath. The micro sheath was then exchanged over a 0.035 wire for a 5 French vascular sheath. A RIM catheter was advanced over a Bentson wire to the abdominal aorta. The inferior mesenteric artery was selected. Angiography was performed. There appears to be recanalization of branches in the distal superior rectal artery. A renegade STC microcatheter was advanced over a Fathom 16 wire and into the distal superior rectal artery. Gentle particle embolization was then performed using 300-500 micron embospheres. The microcatheter was then advanced further into the recanalized superior rectal artery branch. Coil embolization was performed using a series of low-profile Ruby microcoils. Follow-up angiography was performed. All remaining branches appear to supply the sigmoid colon and not the rectum. No further filling of superior rectal branches.  The catheter system was removed. Hemostasis was attained with the assistance of a Celt arterial closure device. IMPRESSION: 1. Angiography is positive for recanalized branches of the distal superior rectal artery. 2. Successful embolization of recanalized superior rectal artery branches. Electronically Signed   By: Jacqulynn Cadet M.D.   On: 11/05/2022 17:17   US Paracentesis  Result Date: 10/28/2022 INDICATION: Refractory ascites in patient with history of NASH cirrhosis status post TIPS procedure May 2023 and TIPS revision July 2023 at [facility]. She recently underwent second TIPS revision 08/20/2022. Request received for therapeutic paracentesis with 8 L max. EXAM: ULTRASOUND GUIDED THERAPEUTIC RIGHT LOWER QUADRANT PARACENTESIS MEDICATIONS: 10 mL 1 % lidocaine COMPLICATIONS: None immediate. PROCEDURE: Informed written consent was obtained from the patient after a discussion of the risks, benefits and alternatives to treatment. A timeout was performed prior to the initiation of the procedure. Initial ultrasound scanning demonstrates a moderate amount of ascites within the right lower abdominal quadrant. The right lower abdomen was prepped and draped in the usual sterile fashion. 1% lidocaine was used for local anesthesia. Following this, a 19 gauge,  10-cm, Yueh catheter was introduced. An ultrasound image was saved for documentation purposes. The paracentesis was performed. The catheter was removed and a dressing was applied. The patient tolerated the procedure well without immediate post procedural complication. Patient received post-procedure intravenous albumin; see nursing notes for details. FINDINGS: A total of approximately 5.35 L of clear, yellow fluid was removed. IMPRESSION: Successful ultrasound-guided paracentesis yielding 5.35 L liters of peritoneal fluid. Read by: Narda Rutherford, AGNP-BC Electronically Signed   By: Ruthann Cancer M.D.   On: 10/28/2022 12:43   US Paracentesis  Result Date:  10/21/2022 INDICATION: Ascites. EXAM: ULTRASOUND GUIDED  PARACENTESIS MEDICATIONS: None. COMPLICATIONS: None immediate. PROCEDURE: Informed written consent was obtained from the patient after a discussion of the risks, benefits and alternatives to treatment. A timeout was performed prior to the initiation of the procedure. Initial ultrasound scanning demonstrates a large amount of ascites within the right lower abdominal quadrant. The right lower abdomen was prepped and draped in the usual sterile fashion. 1% lidocaine was used for local anesthesia. Following this, a centesis catheter was introduced. The paracentesis was performed. The catheter was removed and a dressing was applied. The patient tolerated the procedure well without immediate post procedural complication. FINDINGS: A total of approximately 7 L of straw-colored fluid was removed. IMPRESSION: Successful ultrasound-guided paracentesis yielding 7 liters of peritoneal fluid. Electronically Signed   By: Franki Cabot M.D.   On: 10/21/2022 10:58   [place] ASCITES (ABDOMEN LIMITED)  Result Date: 10/20/2022 CLINICAL DATA:  History cirrhosis for evaluation of ascites status post paracentesis on 10/14/2018 EXAM: LIMITED ABDOMEN ULTRASOUND FOR ASCITES TECHNIQUE: Limited ultrasound survey for ascites was performed in all four abdominal quadrants. COMPARISON:  Renal ultrasound dated 10/16/2022, ultrasound paracentesis dated 10/14/2022 FINDINGS: Interval reaccumulation of large volume ascites, similar to slightly larger volume compared to preprocedural images on 10/14/2022. IMPRESSION: Interval reaccumulation of large volume ascites, similar to slightly larger volume compared to preprocedural images on 10/14/2022. Electronically Signed   By: Darrin Nipper M.D.   On: 10/20/2022 14:53   US RENAL  Result Date: 10/16/2022 CLINICAL DATA:  Impaired renal function EXAM: RENAL / URINARY TRACT ULTRASOUND COMPLETE COMPARISON:  None Available. FINDINGS: Right Kidney: Length:  13.0 cm. Echogenicity within normal limits. No mass or hydronephrosis visualized. Left Kidney: Length: 11.4 cm. Echogenicity within normal limits. No mass or hydronephrosis visualized. Bladder: Appears normal for degree of bladder distention. Other: Note is made of large ascites in the lower abdomen. IMPRESSION: Unremarkable examination of the kidneys. Large ascites noted incidentally. Electronically Signed   By: Sammie Bench M.D.   On: 10/16/2022 10:23   US Paracentesis  Result Date: 10/14/2022 INDICATION: Refractory ascites in patient with history of NASH cirrhosis status post TIPS procedure May 2023 and TIPS revision July 2023 at [facility]. She recently underwent second TIPS revision 08/20/2022. Request received for therapeutic paracentesis with 8 L max. EXAM: ULTRASOUND GUIDED THERAPEUTIC RIGHT UPPER QUADRANT PARACENTESIS MEDICATIONS: 10 mL 1 % lidocaine COMPLICATIONS: None immediate. PROCEDURE: Informed written consent was obtained from the patient after a discussion of the risks, benefits and alternatives to treatment. A timeout was performed prior to the initiation of the procedure. Initial ultrasound scanning demonstrates a moderate amount of ascites within the right upper abdominal quadrant. The right upper abdomen was prepped and draped in the usual sterile fashion. 1% lidocaine was used for local anesthesia. Following this, a 19 gauge, 10-cm, Yueh catheter was introduced. An ultrasound image was saved for documentation purposes. The paracentesis was performed. The catheter was  removed and a dressing was applied. The patient tolerated the procedure well without immediate post procedural complication. Patient received post-procedure intravenous albumin; see nursing notes for details. FINDINGS: A total of approximately 8 L of clear, yellow fluid was removed. IMPRESSION: Successful ultrasound-guided paracentesis yielding 8 liters of peritoneal fluid. Read by: Narda Rutherford, AGNP-BC Electronically Signed    By: Ruthann Cancer M.D.   On: 10/14/2022 13:49   IR Radiologist Eval & Mgmt  Result Date: 10/13/2022 EXAM: ESTABLISHED PATIENT OFFICE VISIT CHIEF COMPLAINT: See Epic note. HISTORY OF PRESENT ILLNESS: See Epic note. REVIEW OF SYSTEMS: See Epic note. PHYSICAL EXAMINATION: See Epic note. ASSESSMENT AND PLAN: See Epic note. Ruthann Cancer, MD [facility] Radiology Electronically Signed   By: Ruthann Cancer M.D.   On: 10/13/2022 13:43   US Paracentesis  Result Date: 10/08/2022 INDICATION: Refractory ascites in patient with history of NASH cirrhosis status post TIPS procedure May 2023 and TIPS revision July 2023 at [facility]. She recently underwent a second TIPS revision 08/20/2022. Request received for therapeutic paracentesis. EXAM: ULTRASOUND GUIDED therapeutic left lower quadrant PARACENTESIS MEDICATIONS: 10 cc 1% lidocaine COMPLICATIONS: None immediate. PROCEDURE: Informed written consent was obtained from the patient after a discussion of the risks, benefits and alternatives to treatment. A timeout was performed prior to the initiation of the procedure. Initial ultrasound scanning demonstrates a large amount of ascites within the left lower abdominal quadrant. The left lower abdomen was prepped and draped in the usual sterile fashion. 1% lidocaine was used for local anesthesia. Following this, a 19 gauge, 10-cm, Yueh catheter was introduced. An ultrasound image was saved for documentation purposes. The paracentesis was performed. The catheter was removed and a dressing was applied. The patient tolerated the procedure well without immediate post procedural complication. Patient received post-procedure intravenous albumin; see nursing notes for details. FINDINGS: A total of approximately 6 L of yellow fluid was removed. IMPRESSION: Successful ultrasound-guided paracentesis yielding 6 liters of peritoneal fluid. PLAN: Patient has management of TIPS done by [facility] hospital.  Read by: Reatha Armour, PA-C Electronically Signed   By: Miachel Roux M.D.   On: 10/08/2022 12:20    Microbiology: Results for orders placed or performed during the hospital encounter of 11/03/22  Resp panel by RT-PCR (RSV, Flu A&B, Covid) Anterior Nasal Swab     Status: None   Collection Time: 11/03/22  8:38 AM   Specimen: Anterior Nasal Swab  Result Value Ref Range Status   SARS Coronavirus 2 by RT PCR NEGATIVE NEGATIVE Final    Comment: (NOTE) SARS-CoV-2 target nucleic acids are NOT DETECTED.  The SARS-CoV-2 RNA is generally detectable in upper respiratory specimens during the acute phase of infection. The lowest concentration of SARS-CoV-2 viral copies this assay can detect is 138 copies/mL. A negative result does not preclude SARS-Cov-2 infection and should not be used as the sole basis for treatment or other patient management decisions. A negative result may occur with  improper specimen collection/handling, submission of specimen other than nasopharyngeal swab, presence of viral mutation(s) within the areas targeted by this assay, and inadequate number of viral copies(<138 copies/mL). A negative result must be combined with clinical observations, patient history, and epidemiological information. The expected result is Negative.  Fact Sheet for Patients:  EntrepreneurPulse.com.au  Fact Sheet for Healthcare Providers:  IncredibleEmployment.be  This test is no t yet approved or cleared by the [place] FDA and  has been authorized for detection and/or diagnosis of SARS-CoV-2 by FDA under an  Emergency Use Authorization (EUA). This EUA will remain  in effect (meaning this test can be used) for the duration of the COVID-19 declaration under Section 564(b)(1) of the Act, 21 U.S.C.section 360bbb-3(b)(1), unless the authorization is terminated  or revoked sooner.       Influenza A by PCR NEGATIVE NEGATIVE Final   Influenza B by PCR  NEGATIVE NEGATIVE Final    Comment: (NOTE) The Xpert Xpress SARS-CoV-2/FLU/RSV plus assay is intended as an aid in the diagnosis of influenza from Nasopharyngeal swab specimens and should not be used as a sole basis for treatment. Nasal washings and aspirates are unacceptable for Xpert Xpress SARS-CoV-2/FLU/RSV testing.  Fact Sheet for Patients: EntrepreneurPulse.com.au  Fact Sheet for Healthcare Providers: IncredibleEmployment.be  This test is not yet approved or cleared by the [place] FDA and has been authorized for detection and/or diagnosis of SARS-CoV-2 by FDA under an Emergency Use Authorization (EUA). This EUA will remain in effect (meaning this test can be used) for the duration of the COVID-19 declaration under Section 564(b)(1) of the Act, 21 U.S.C. section 360bbb-3(b)(1), unless the authorization is terminated or revoked.     Resp Syncytial Virus by PCR NEGATIVE NEGATIVE Final    Comment: (NOTE) Fact Sheet for Patients: EntrepreneurPulse.com.au  Fact Sheet for Healthcare Providers: IncredibleEmployment.be  This test is not yet approved or cleared by the [place] FDA and has been authorized for detection and/or diagnosis of SARS-CoV-2 by FDA under an Emergency Use Authorization (EUA). This EUA will remain in effect (meaning this test can be used) for the duration of the COVID-19 declaration under Section 564(b)(1) of the Act, 21 U.S.C. section 360bbb-3(b)(1), unless the authorization is terminated or revoked.  Performed at [facility], [address] [place], [place] [address]     Labs: CBC: Recent Labs  Lab 11/03/22 0827 11/03/22 1618 11/04/22 0304 11/04/22 0744 11/04/22 1333 11/05/22 0516 11/06/22 0709  WBC 5.8  --   --   --   --  3.1* 2.6*  NEUTROABS  --   --   --   --   --  1.9 1.4*  HGB 3.6*   < > 7.5* 7.1* 8.0* 6.8* 8.0*  HCT 12.7*   < > 23.9* 21.6*  24.6* 21.5* 25.0*  MCV 84.7  --   --   --   --  85.0 84.2  PLT 160  --   --   --   --  84* 62*   < > = values in this interval not displayed.   Basic Metabolic Panel: Recent Labs  Lab 11/03/22 0827 11/03/22 0838 11/03/22 1833 11/04/22 0432 11/06/22 0709  NA 137  --  138 139 140  K 5.5*  --  4.1 3.8 3.2*  CL 106  --  106 107 108  CO2 21*  --  21* 22 22  GLUCOSE 183*  --  120* 140* 175*  BUN 53*  --  52* 52* 47*  CREATININE 2.42*  --  2.43* 2.27* 2.12*  CALCIUM 8.5*  --  8.4* 8.2* 7.8*  MG  --  2.0  --   --   --    Liver Function Tests: Recent Labs  Lab 11/03/22 0827 11/03/22 1833 11/04/22 0432  AST 41 38 35  ALT 13 15 13   ALKPHOS 80 73 65  BILITOT 2.3* 2.2* 2.6*  PROT 4.4* 4.2* 4.0*  ALBUMIN 2.4* 2.2* 2.1*   CBG: No results for input(s): "GLUCAP" in the last 168 hours.  Discharge time  spent: greater than 30 minutes.  Signed: Jennye Boroughs, MD [facility] 11/06/2022

## 2022-11-06 NOTE — Progress Notes (Signed)
Referring Physician(s): [name]  Supervising Physician: Juliet Rude  Patient Status:  [facility] - In-pt  Reason for visit: S/p Angiogram with embolization of distal recanalized branch of the superior rectal artery 3/20 in IR   Subjective: Patient states she is doing better and states she did have a BM with BRBPR however significantly less bleeding than admission amount, she states approximately a tablespoon worth. She denies any RLE access site pain, bleeding, swelling, temperature or neurological changes.  Allergies: Gramineae pollens and Latex  Medications: Prior to Admission medications   Medication Sig Start Date End Date Taking? Authorizing Provider  acetaminophen (TYLENOL) 650 MG CR tablet Take 1,300 mg by mouth daily as needed for pain.   Yes [provider]  albuterol (VENTOLIN HFA) 108 (90 Base) MCG/ACT inhaler Inhale 2 puffs into the lungs every 6 (six) hours as needed for wheezing or shortness of breath. 02/10/22  Yes Wieting, Richard, MD  atorvastatin (LIPITOR) 10 MG tablet Take 1 tablet (10 mg total) by mouth daily. 06/20/22  Yes Fritzi Mandes, MD  ciprofloxacin (CIPRO) 500 MG tablet Take 500 mg by mouth daily with breakfast.   Yes [provider]  gabapentin (NEURONTIN) 400 MG capsule Take 400 mg by mouth 2 (two) times daily. 01/07/18  Yes [provider]  melatonin 5 MG TABS Take 10 mg by mouth at bedtime as needed (sleep).   Yes [provider]  midodrine (PROAMATINE) 10 MG tablet Take 1 tablet (10 mg total) by mouth 3 (three) times daily. Take 10 mg twice daily 10/25/22 11/24/22 Yes Tat, Shanon Brow, MD  Multiple Vitamins-Minerals (MULTIVITAMIN WITH MINERALS) tablet Take 1 tablet by mouth daily.   Yes [provider]  polyethylene glycol (MIRALAX / GLYCOLAX) 17 g packet Take 17 g by mouth 2 (two) times daily. 06/20/22  Yes Fritzi Mandes, MD  potassium chloride SA (KLOR-CON M) 20 MEQ tablet Take 1 tablet (20 mEq total) by mouth  2 (two) times daily. 10/26/22  Yes Tat, Shanon Brow, MD  sertraline (ZOLOFT) 50 MG tablet Take 50 mg by mouth daily. 12/12/17  Yes [provider]  spironolactone (ALDACTONE) 25 MG tablet Take 1 tablet (25 mg total) by mouth daily. 10/26/22  Yes Tat, Shanon Brow, MD  torsemide 40 MG TABS Take 40 mg by mouth daily. 10/26/22  Yes Tat, Shanon Brow, MD  vitamin B-12 (CYANOCOBALAMIN) 1000 MCG tablet Take 1,000 mcg by mouth daily.   Yes [provider]  witch hazel-glycerin (TUCKS) pad Apply topically as needed for hemorrhoids. 03/07/22  Yes Wieting, Richard, MD  diphenhydrAMINE (BENADRYL) 25 MG tablet Take 25 mg by mouth 2 (two) times daily as needed for itching.    [provider]  ergocalciferol (VITAMIN D2) 1.25 MG (50000 UT) capsule Take 1 capsule (50,000 Units total) by mouth every 14 (fourteen) days. 09/01/22   Harriett Rush, PA-C  oxymetazoline (AFRIN) 0.05 % nasal spray Place 1 spray into both nostrils daily as needed for congestion.    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 She has undergone prior superior rectal artery embolization on 02/07/2022 with repeat embolization on 09/05/2022 which has worked well for several months. Unfortunately, she is readmitted with significant rectal bleeding. She presents today as an inpatient for repeat mesenteric angiography and possible additional embolization of  recanalized superior rectal artery branches. EXAM: IR EMBO ART  VEN HEMORR LYMPH EXTRAV  INC GUIDE ROADMAPPING 1. Ultrasound access right common femoral artery 2. Catheterization of the internal mesenteric artery with arteriogram 3. Catheterization of the superior rectal artery with arteriogram 4. Catheterization of recanalized branch of the superior rectal artery with arteriogram 5. Coil embolization MEDICATIONS: Ancef 2 g. The antibiotic was administered within 1 hour of the procedure ANESTHESIA/SEDATION: Moderate (conscious) sedation was employed during this procedure. A total of Versed 2 mg and Fentanyl 100 mcg was administered intravenously. Moderate Sedation Time: 46 minutes. The patient's level of consciousness and vital signs were monitored continuously by radiology nursing throughout the procedure under my direct supervision. CONTRAST:  66mL VISIPAQUE IODIXANOL 320 MG/ML IV SOLN FLUOROSCOPY: Radiation Exposure Index (as provided by the fluoroscopic device): 123XX123 mGy Kerma COMPLICATIONS: None immediate. PROCEDURE: Informed consent was obtained from the patient following explanation of the procedure, risks, benefits and alternatives. The patient understands, agrees and consents for the procedure. All questions were addressed. A time out was performed prior to the initiation of the procedure. Maximal barrier sterile technique utilized including caps, mask, sterile gowns, sterile gloves, large sterile drape, hand hygiene, and Betadine prep. The right common femoral artery was interrogated with ultrasound and found to be widely patent. An image was obtained and stored for the medical record. Local anesthesia was attained by infiltration with 1% lidocaine. A small dermatotomy was made. Under real-time sonographic guidance, the vessel was punctured with a 21 gauge micropuncture needle. Using standard technique, the initial micro needle was exchanged over a 0.018 micro wire for a transitional 4 [place] micro  sheath. The micro sheath was then exchanged over a 0.035 wire for a 5 French vascular sheath. A RIM catheter was advanced over a Bentson wire to the abdominal aorta. The inferior mesenteric artery was selected. Angiography was performed. There appears to be recanalization of branches in the distal superior rectal artery. A renegade STC microcatheter was advanced over a Fathom 16 wire and into the distal superior rectal artery. Gentle particle embolization was then performed using 300-500 micron embospheres. The microcatheter was then advanced further into the recanalized superior rectal artery branch. Coil embolization was performed using a series of low-profile Ruby microcoils. Follow-up angiography was performed. All remaining branches appear to supply the sigmoid colon and not the rectum. No further filling of superior rectal branches. The catheter system was removed. Hemostasis was attained with the assistance of a Celt arterial closure device. IMPRESSION: 1. Angiography is positive for recanalized branches of the distal superior rectal artery. 2. Successful embolization of recanalized superior rectal artery branches. Electronically Signed   By: Jacqulynn Cadet M.D.   On: 11/05/2022 17:17    Labs:  CBC: Recent Labs    10/29/22 1118 11/03/22 0827 11/03/22 1618 11/04/22 0744 11/04/22 1333 11/05/22 0516 11/06/22 0709  WBC 3.5* 5.8  --   --   --  3.1* 2.6*  HGB 5.3* 3.6*   < > 7.1* 8.0* 6.8* 8.0*  HCT 17.3* 12.7*   < > 21.6* 24.6*  21.5* 25.0*  PLT 94* 160  --   --   --  84* 62*   < > = values in this interval not displayed.    COAGS: Recent Labs    03/22/22 0206 06/20/22 0626 09/05/22 1108 09/17/22 2111 09/18/22 1253 09/19/22 0359 11/03/22 0827  INR 1.3*   < > 1.3* 1.4* 1.4*  --  1.4*  APTT 30  --   --  34 35 36  --    < > = values in this interval not displayed.    BMP: Recent Labs    11/03/22 0827 11/03/22 1833 11/04/22 0432 11/06/22 0709  NA 137 138 139 140  K 5.5*  4.1 3.8 3.2*  CL 106 106 107 108  CO2 21* 21* 22 22  GLUCOSE 183* 120* 140* 175*  BUN 53* 52* 52* 47*  CALCIUM 8.5* 8.4* 8.2* 7.8*  CREATININE 2.42* 2.43* 2.27* 2.12*  GFRNONAA 22* 22* 24* 26*    LIVER FUNCTION TESTS: Recent Labs    10/17/22 0429 10/17/22 0430 10/25/22 0505 11/03/22 0827 11/03/22 1833 11/04/22 0432  BILITOT 1.5*  --   --  2.3* 2.2* 2.6*  AST 47*  --   --  41 38 35  ALT 8  --   --  13 15 13   ALKPHOS 79  --   --  80 73 65  PROT 4.8*  --   --  4.4* 4.2* 4.0*  ALBUMIN 2.8*   < > 2.8* 2.4* 2.2* 2.1*   < > = values in this interval not displayed.    Assessment and Plan: 67 year old female with PMHx significant for NASH cirrhosis and portal HTN s/p TIPS creation and revision of TIPS at an outside facility. She has had embolizations previously with IR for bleeding hemorrhoids and had recurrent bleeding resulting in significant blood loss anemia. She is s/p successful angiogram with IR 3/20 which was positive for recanalized branches of the distal superior rectal artery followed by successful embolization with embospheres and microcoils. Hgb stable at 8 today and patient states she has only had a small amount of BRBPR amount of a tablespoon which is significantly improved compared to admission. She is being discharged today and will follow-up with Dr. Serafina Royals as previously scheduled. RCFA without bleeding, hematoma or complication, pulses intact.   Electronically Signed: Hedy Jacob, PA-C 11/06/2022, 3:36 PM   I spent a total of 15 Minutes at the the patient's bedside AND on the patient's hospital floor or unit, greater than 50% of which was counseling/coordinating care for hemorrhoidal bleeding.

## 2022-11-07 DIAGNOSIS — R188 Other ascites: Secondary | ICD-10-CM | POA: Diagnosis not present

## 2022-11-07 DIAGNOSIS — K7460 Unspecified cirrhosis of liver: Secondary | ICD-10-CM | POA: Diagnosis not present

## 2022-11-07 DIAGNOSIS — K766 Portal hypertension: Secondary | ICD-10-CM | POA: Diagnosis not present

## 2022-11-07 DIAGNOSIS — Z95828 Presence of other vascular implants and grafts: Secondary | ICD-10-CM | POA: Diagnosis not present

## 2022-11-07 LAB — BPAM RBC
Blood Product Expiration Date: 202403212359
Blood Product Expiration Date: 202403282359
Blood Product Expiration Date: 202403312359
Blood Product Expiration Date: 202404102359
Blood Product Expiration Date: 202404182359
Blood Product Expiration Date: 202404182359
ISSUE DATE / TIME: 202403181112
ISSUE DATE / TIME: 202403181410
ISSUE DATE / TIME: 202403182011
ISSUE DATE / TIME: 202403182310
ISSUE DATE / TIME: 202403200758
Unit Type and Rh: 600
Unit Type and Rh: 600
Unit Type and Rh: 6200
Unit Type and Rh: 6200
Unit Type and Rh: 6200
Unit Type and Rh: 6200

## 2022-11-07 LAB — TYPE AND SCREEN
ABO/RH(D): A POS
Antibody Screen: NEGATIVE
Unit division: 0
Unit division: 0
Unit division: 0
Unit division: 0
Unit division: 0
Unit division: 0

## 2022-11-07 LAB — PREPARE RBC (CROSSMATCH)

## 2022-11-11 ENCOUNTER — Ambulatory Visit
Admission: RE | Admit: 2022-11-11 | Discharge: 2022-11-11 | Disposition: A | Discharge status: Home / Self Care | Payer: 59 | Source: Ambulatory Visit | Attending: Gastroenterology | Admitting: Gastroenterology

## 2022-11-11 DIAGNOSIS — K7581 Nonalcoholic steatohepatitis (NASH): Secondary | ICD-10-CM | POA: Diagnosis not present

## 2022-11-11 DIAGNOSIS — K7460 Unspecified cirrhosis of liver: Secondary | ICD-10-CM | POA: Diagnosis not present

## 2022-11-11 DIAGNOSIS — R188 Other ascites: Secondary | ICD-10-CM | POA: Diagnosis not present

## 2022-11-11 MED ORDER — LIDOCAINE HCL (PF) 1 % IJ SOLN
20.0000 mL | Freq: Once | INTRAMUSCULAR | Status: AC
  Administered 2022-11-11: 20 mL via INTRADERMAL

## 2022-11-11 NOTE — Procedures (Signed)
PROCEDURE SUMMARY:  Successful US guided paracentesis from right lower quadrant.  Yielded 3.6 L of clear yellow fluid.  No immediate complications.  Pt tolerated well.   Specimen not sent for labs.  EBL < 2 mL  Patient is s/p TIPS and TIPS revision at [facility]. Currently still followed by [facility].   Theresa Duty, NP 11/11/2022 12:24 PM

## 2022-11-12 ENCOUNTER — Inpatient Hospital Stay: Payer: 59

## 2022-11-12 DIAGNOSIS — D500 Iron deficiency anemia secondary to blood loss (chronic): Secondary | ICD-10-CM | POA: Diagnosis not present

## 2022-11-12 DIAGNOSIS — D563 Thalassemia minor: Secondary | ICD-10-CM | POA: Diagnosis not present

## 2022-11-12 LAB — CBC
HCT: 24.4 % — ABNORMAL LOW (ref 36.0–46.0)
Hemoglobin: 7.7 g/dL — ABNORMAL LOW (ref 12.0–15.0)
MCH: 26.7 pg (ref 26.0–34.0)
MCHC: 31.6 g/dL (ref 30.0–36.0)
MCV: 84.7 fL (ref 80.0–100.0)
Platelets: 106 10*3/uL — ABNORMAL LOW (ref 150–400)
RBC: 2.88 MIL/uL — ABNORMAL LOW (ref 3.87–5.11)
RDW: 18.7 % — ABNORMAL HIGH (ref 11.5–15.5)
WBC: 3.3 10*3/uL — ABNORMAL LOW (ref 4.0–10.5)
nRBC: 0 % (ref 0.0–0.2)

## 2022-11-12 LAB — SAMPLE TO BLOOD BANK

## 2022-11-12 NOTE — Progress Notes (Unsigned)
Hemoglobin is 7.7 today. Per Tarri Abernethy PA-C, no need if administer blood if hemoglobin is above 7.0  Patient was called and aware.

## 2022-11-13 ENCOUNTER — Inpatient Hospital Stay: Payer: 59

## 2022-11-17 ENCOUNTER — Emergency Department (HOSPITAL_COMMUNITY): Payer: Medicare Other

## 2022-11-17 ENCOUNTER — Emergency Department (HOSPITAL_COMMUNITY)
Admission: EM | Admit: 2022-11-17 | Discharge: 2022-11-17 | Disposition: A | Discharge status: Home / Self Care | Payer: Medicare Other | Attending: Emergency Medicine | Admitting: Emergency Medicine

## 2022-11-17 ENCOUNTER — Other Ambulatory Visit: Payer: Self-pay

## 2022-11-17 ENCOUNTER — Encounter (HOSPITAL_COMMUNITY): Payer: Self-pay | Admitting: Emergency Medicine

## 2022-11-17 DIAGNOSIS — K7581 Nonalcoholic steatohepatitis (NASH): Secondary | ICD-10-CM | POA: Insufficient documentation

## 2022-11-17 DIAGNOSIS — D561 Beta thalassemia: Secondary | ICD-10-CM | POA: Insufficient documentation

## 2022-11-17 DIAGNOSIS — Z91040 Latex allergy status: Secondary | ICD-10-CM | POA: Insufficient documentation

## 2022-11-17 DIAGNOSIS — R7989 Other specified abnormal findings of blood chemistry: Secondary | ICD-10-CM | POA: Diagnosis not present

## 2022-11-17 DIAGNOSIS — N189 Chronic kidney disease, unspecified: Secondary | ICD-10-CM | POA: Diagnosis not present

## 2022-11-17 DIAGNOSIS — D696 Thrombocytopenia, unspecified: Secondary | ICD-10-CM | POA: Insufficient documentation

## 2022-11-17 DIAGNOSIS — D649 Anemia, unspecified: Secondary | ICD-10-CM | POA: Insufficient documentation

## 2022-11-17 DIAGNOSIS — E559 Vitamin D deficiency, unspecified: Secondary | ICD-10-CM | POA: Insufficient documentation

## 2022-11-17 DIAGNOSIS — I959 Hypotension, unspecified: Secondary | ICD-10-CM | POA: Diagnosis not present

## 2022-11-17 DIAGNOSIS — E538 Deficiency of other specified B group vitamins: Secondary | ICD-10-CM | POA: Insufficient documentation

## 2022-11-17 DIAGNOSIS — D631 Anemia in chronic kidney disease: Secondary | ICD-10-CM | POA: Diagnosis not present

## 2022-11-17 DIAGNOSIS — D500 Iron deficiency anemia secondary to blood loss (chronic): Secondary | ICD-10-CM | POA: Insufficient documentation

## 2022-11-17 DIAGNOSIS — R531 Weakness: Secondary | ICD-10-CM | POA: Diagnosis not present

## 2022-11-17 DIAGNOSIS — K648 Other hemorrhoids: Secondary | ICD-10-CM | POA: Diagnosis not present

## 2022-11-17 DIAGNOSIS — A419 Sepsis, unspecified organism: Secondary | ICD-10-CM | POA: Diagnosis not present

## 2022-11-17 DIAGNOSIS — Z743 Need for continuous supervision: Secondary | ICD-10-CM | POA: Diagnosis not present

## 2022-11-17 DIAGNOSIS — K7460 Unspecified cirrhosis of liver: Secondary | ICD-10-CM | POA: Insufficient documentation

## 2022-11-17 DIAGNOSIS — I129 Hypertensive chronic kidney disease with stage 1 through stage 4 chronic kidney disease, or unspecified chronic kidney disease: Secondary | ICD-10-CM | POA: Diagnosis not present

## 2022-11-17 DIAGNOSIS — Z79899 Other long term (current) drug therapy: Secondary | ICD-10-CM | POA: Diagnosis not present

## 2022-11-17 LAB — CBC WITH DIFFERENTIAL/PLATELET
Abs Immature Granulocytes: 0 10*3/uL (ref 0.00–0.07)
Basophils Absolute: 0 10*3/uL (ref 0.0–0.1)
Basophils Relative: 0 %
Eosinophils Absolute: 0.1 10*3/uL (ref 0.0–0.5)
Eosinophils Relative: 3 %
HCT: 22.1 % — ABNORMAL LOW (ref 36.0–46.0)
Hemoglobin: 6.8 g/dL — CL (ref 12.0–15.0)
Immature Granulocytes: 0 %
Lymphocytes Relative: 20 %
Lymphs Abs: 0.5 10*3/uL — ABNORMAL LOW (ref 0.7–4.0)
MCH: 25.6 pg — ABNORMAL LOW (ref 26.0–34.0)
MCHC: 30.8 g/dL (ref 30.0–36.0)
MCV: 83.1 fL (ref 80.0–100.0)
Monocytes Absolute: 0.3 10*3/uL (ref 0.1–1.0)
Monocytes Relative: 12 %
Neutro Abs: 1.5 10*3/uL — ABNORMAL LOW (ref 1.7–7.7)
Neutrophils Relative %: 65 %
Platelets: 109 10*3/uL — ABNORMAL LOW (ref 150–400)
RBC: 2.66 MIL/uL — ABNORMAL LOW (ref 3.87–5.11)
RDW: 19.9 % — ABNORMAL HIGH (ref 11.5–15.5)
WBC: 2.4 10*3/uL — ABNORMAL LOW (ref 4.0–10.5)
nRBC: 0 % (ref 0.0–0.2)

## 2022-11-17 LAB — BLOOD GAS, VENOUS
Acid-Base Excess: 0.1 mmol/L (ref 0.0–2.0)
Bicarbonate: 24.7 mmol/L (ref 20.0–28.0)
Drawn by: 51079
O2 Saturation: 45.8 %
Patient temperature: 37.1
pCO2, Ven: 39 mmHg — ABNORMAL LOW (ref 44–60)
pH, Ven: 7.41 (ref 7.25–7.43)
pO2, Ven: <31 mmHg — CL (ref 32–45)

## 2022-11-17 LAB — COMPREHENSIVE METABOLIC PANEL
ALT: 21 U/L (ref 0–44)
AST: 52 U/L — ABNORMAL HIGH (ref 15–41)
Albumin: 2.5 g/dL — ABNORMAL LOW (ref 3.5–5.0)
Alkaline Phosphatase: 122 U/L (ref 38–126)
Anion gap: 7 (ref 5–15)
BUN: 45 mg/dL — ABNORMAL HIGH (ref 8–23)
CO2: 22 mmol/L (ref 22–32)
Calcium: 7.9 mg/dL — ABNORMAL LOW (ref 8.9–10.3)
Chloride: 104 mmol/L (ref 98–111)
Creatinine, Ser: 1.94 mg/dL — ABNORMAL HIGH (ref 0.44–1.00)
GFR, Estimated: 28 mL/min — ABNORMAL LOW (ref >60)
Glucose, Bld: 135 mg/dL — ABNORMAL HIGH (ref 70–99)
Potassium: 4 mmol/L (ref 3.5–5.1)
Sodium: 133 mmol/L — ABNORMAL LOW (ref 135–145)
Total Bilirubin: 1.5 mg/dL — ABNORMAL HIGH (ref 0.3–1.2)
Total Protein: 5 g/dL — ABNORMAL LOW (ref 6.5–8.1)

## 2022-11-17 LAB — PREPARE RBC (CROSSMATCH)

## 2022-11-17 LAB — APTT: aPTT: 33 seconds (ref 24–36)

## 2022-11-17 LAB — TROPONIN I (HIGH SENSITIVITY)
Troponin I (High Sensitivity): 8 ng/L (ref <18)
Troponin I (High Sensitivity): 8 ng/L (ref <18)

## 2022-11-17 LAB — LACTIC ACID, PLASMA
Lactic Acid, Venous: 1.8 mmol/L (ref 0.5–1.9)
Lactic Acid, Venous: 1.5 mmol/L (ref 0.5–1.9)

## 2022-11-17 LAB — AMMONIA: Ammonia: 50 umol/L — ABNORMAL HIGH (ref 9–35)

## 2022-11-17 LAB — BRAIN NATRIURETIC PEPTIDE: B Natriuretic Peptide: 302 pg/mL — ABNORMAL HIGH (ref 0.0–100.0)

## 2022-11-17 LAB — PROTIME-INR
INR: 1.1 (ref 0.8–1.2)
Prothrombin Time: 14.5 seconds (ref 11.4–15.2)

## 2022-11-17 LAB — MAGNESIUM: Magnesium: 2.1 mg/dL (ref 1.7–2.4)

## 2022-11-17 MED ORDER — SODIUM CHLORIDE 0.9% IV SOLUTION: Freq: Once | INTRAVENOUS | Status: AC

## 2022-11-17 NOTE — ED Notes (Signed)
Date and time results received: 11/17/22 0308   Test: VBG  pO2 Critical Value: < 31  Name of Provider Notified: Mesner, MD

## 2022-11-17 NOTE — ED Triage Notes (Signed)
Pt BIB [facility] from home c/o generalized weakness. Pt states hx of same, has her Hgb checked weekly at the Cancer center. Pt reports only bleeding is from her hemorrhoid.

## 2022-11-17 NOTE — ED Provider Notes (Signed)
[facility] Provider Note   CSN: [id number] Arrival date & time: 11/17/22  0037     History  Chief Complaint  Patient presents with   Weakness    Rachel Huber is a 67 y.o. female.  Patient with multiple medical problems most prominent of which is NASH status post TIPS, and internal hemorrhoids with intermittent bleeding.  She presents the ER today secondary to generalized weakness.  She feels like her hemoglobin might be low.  She states taking all her medications as she supposed to.  She had a hemoglobin below for last week was admitted to [facility] for multiple transfusions.  She states that her internal hemorrhoid has been bleeding again the last day or so she feels like she is weak because of it.  No recent illnesses otherwise.  No chest pain, shortness of breath, cough, abdominal pain that is new.  She states she had a paracentesis last week and she feels like her abdomen feels relatively well and is not distended or painful.   Weakness      Home Medications Prior to Admission medications   Medication Sig Start Date End Date Taking? Authorizing Provider  acetaminophen (TYLENOL) 650 MG CR tablet Take 1,300 mg by mouth daily as needed for pain.    [provider]  albuterol (VENTOLIN HFA) 108 (90 Base) MCG/ACT inhaler Inhale 2 puffs into the lungs every 6 (six) hours as needed for wheezing or shortness of breath. 02/10/22   Loletha Grayer, MD  atorvastatin (LIPITOR) 10 MG tablet Take 1 tablet (10 mg total) by mouth daily. 06/20/22   Fritzi Mandes, MD  ciprofloxacin (CIPRO) 500 MG tablet Take 500 mg by mouth daily with breakfast.    [provider]  diphenhydrAMINE (BENADRYL) 25 MG tablet Take 25 mg by mouth 2 (two) times daily as needed for itching.    [provider]  ergocalciferol (VITAMIN D2) 1.25 MG (50000 UT) capsule Take 1 capsule (50,000 Units total) by mouth every 14 (fourteen) days. 09/01/22   Harriett Rush, PA-C  gabapentin (NEURONTIN) 400 MG capsule Take 400 mg by mouth 2 (two) times daily. 01/07/18   [provider]  melatonin 5 MG TABS Take 10 mg by mouth at bedtime as needed (sleep).    [provider]  midodrine (PROAMATINE) 10 MG tablet Take 1 tablet (10 mg total) by mouth 3 (three) times daily. Take 10 mg twice daily 10/25/22 11/24/22  Tat, Shanon Brow, MD  Multiple Vitamins-Minerals (MULTIVITAMIN WITH MINERALS) tablet Take 1 tablet by mouth daily.    [provider]  oxymetazoline (AFRIN) 0.05 % nasal spray Place 1 spray into both nostrils daily as needed for congestion.    [provider]  polyethylene glycol (MIRALAX / GLYCOLAX) 17 g packet Take 17 g by mouth 2 (two) times daily. 06/20/22   Fritzi Mandes, MD  potassium chloride SA (KLOR-CON M) 20 MEQ tablet Take 1 tablet (20 mEq total) by mouth 2 (two) times daily. 10/26/22   Orson Eva, MD  sertraline (ZOLOFT) 50 MG tablet Take 50 mg by mouth daily. 12/12/17   [provider]  spironolactone (ALDACTONE) 25 MG tablet Take 1 tablet (25 mg total) by mouth daily. 10/26/22   Orson Eva, MD  torsemide 40 MG TABS Take 40 mg by mouth daily. 10/26/22   Orson Eva, MD  vitamin B-12 (CYANOCOBALAMIN) 1000 MCG tablet Take 1,000 mcg by mouth daily.    [provider]  witch hazel-glycerin (  TUCKS) pad Apply topically as needed for hemorrhoids. 03/07/22   Loletha Grayer, MD      Allergies    Gramineae pollens and Latex    Review of Systems   Review of Systems  Neurological:  Positive for weakness.    Physical Exam Updated Vital Signs BP (!) 113/49   Pulse 74   Temp 98.8 F (37.1 C)   Resp 18   Ht 5\' 7"  (1.702 m)   Wt 102.1 kg   SpO2 95%   BMI 35.24 kg/m  Physical Exam Vitals and nursing note reviewed.  Constitutional:      Appearance: She is well-developed.  HENT:     Head: Normocephalic and atraumatic.  Eyes:     Pupils: Pupils are equal, round, and reactive to light.   Cardiovascular:     Rate and Rhythm: Normal rate and regular rhythm.  Pulmonary:     Effort: No respiratory distress.     Breath sounds: No stridor.  Abdominal:     General: Abdomen is flat. There is no distension.  Musculoskeletal:     Cervical back: Normal range of motion.  Skin:    General: Skin is warm and dry.  Neurological:     General: No focal deficit present.     Mental Status: She is alert.     ED Results / Procedures / Treatments   Labs (all labs ordered are listed, but only abnormal results are displayed) Labs Reviewed  COMPREHENSIVE METABOLIC PANEL - Abnormal; Notable for the following components:      Result Value   Sodium 133 (*)    Glucose, Bld 135 (*)    BUN 45 (*)    Creatinine, Ser 1.94 (*)    Calcium 7.9 (*)    Total Protein 5.0 (*)    Albumin 2.5 (*)    AST 52 (*)    Total Bilirubin 1.5 (*)    GFR, Estimated 28 (*)    All other components within normal limits  CBC WITH DIFFERENTIAL/PLATELET - Abnormal; Notable for the following components:   WBC 2.4 (*)    RBC 2.66 (*)    Hemoglobin 6.8 (*)    HCT 22.1 (*)    MCH 25.6 (*)    RDW 19.9 (*)    Platelets 109 (*)    Neutro Abs 1.5 (*)    Lymphs Abs 0.5 (*)    All other components within normal limits  BRAIN NATRIURETIC PEPTIDE - Abnormal; Notable for the following components:   B Natriuretic Peptide 302.0 (*)    All other components within normal limits  BLOOD GAS, VENOUS - Abnormal; Notable for the following components:   pCO2, Ven 39 (*)    pO2, Ven <31 (*)    All other components within normal limits  AMMONIA - Abnormal; Notable for the following components:   Ammonia 50 (*)    All other components within normal limits  CULTURE, BLOOD (ROUTINE X 2)  CULTURE, BLOOD (ROUTINE X 2)  LACTIC ACID, PLASMA  LACTIC ACID, PLASMA  PROTIME-INR  APTT  MAGNESIUM  URINALYSIS, W/ REFLEX TO CULTURE (INFECTION SUSPECTED)  TYPE AND SCREEN  PREPARE RBC (CROSSMATCH)  TROPONIN I (HIGH SENSITIVITY)   TROPONIN I (HIGH SENSITIVITY)    EKG None  Radiology DG Chest Port 1 View  Result Date: 11/17/2022 CLINICAL DATA:  Sepsis EXAM: PORTABLE CHEST 1 VIEW COMPARISON:  09/17/2022 FINDINGS: Lungs are clear. No pneumothorax or pleural effusion. Cardiac size within normal limits. Previously noted right internal jugular  chest port has been removed. Pulmonary vascularity is normal. Right total shoulder arthroplasty has been performed. No acute bone abnormality. IMPRESSION: 1. No active disease. Electronically Signed   By: Fidela Salisbury M.D.   On: 11/17/2022 01:21    Procedures Procedures    Medications Ordered in ED Medications  0.9 %  sodium chloride infusion (Manually program via Guardrails IV Fluids) (0 mLs Intravenous Stopped 11/17/22 0523)    ED Course/ Medical Decision Making/ A&P                             Medical Decision Making Amount and/or Complexity of Data Reviewed Labs: ordered. Radiology: ordered. ECG/medicine tests: ordered.  Risk Prescription drug management.   Patient found to have a hemoglobin of 6.8.  This likely explains her weakness.  We know the source.  She has tried to get these fixed but they want a wait to see if he can get a liver transplant or not.  Transfusion given here in the ER on reevaluation patient feels better.  Vital signs are stable.  No indication for other alternative causes for symptoms.  She will be stable for discharge to follow-up with her outpatient physicians for further management of her multiple medical problems and recheck of her hemoglobin.  Final Clinical Impression(s) / ED Diagnoses Final diagnoses:  Anemia, unspecified type    Rx / DC Orders ED Discharge Orders     None         Trysten Berti, Corene Cornea, MD 11/17/22 [phone]

## 2022-11-18 ENCOUNTER — Ambulatory Visit
Admission: RE | Admit: 2022-11-18 | Discharge: 2022-11-18 | Disposition: A | Discharge status: Home / Self Care | Payer: Medicare Other | Source: Ambulatory Visit | Attending: Gastroenterology | Admitting: Gastroenterology

## 2022-11-18 ENCOUNTER — Encounter: Payer: Self-pay | Admitting: Hematology

## 2022-11-18 DIAGNOSIS — K7460 Unspecified cirrhosis of liver: Secondary | ICD-10-CM | POA: Diagnosis not present

## 2022-11-18 DIAGNOSIS — K7581 Nonalcoholic steatohepatitis (NASH): Secondary | ICD-10-CM | POA: Insufficient documentation

## 2022-11-18 LAB — TYPE AND SCREEN
ABO/RH(D): A POS
Antibody Screen: NEGATIVE
Unit division: 0

## 2022-11-18 LAB — BPAM RBC
Blood Product Expiration Date: 202404182359
ISSUE DATE / TIME: 202404010341
Unit Type and Rh: 6200

## 2022-11-18 MED ORDER — ALBUMIN HUMAN 25 % IV SOLN: 25.0000 g | Freq: Once | INTRAVENOUS | Status: AC

## 2022-11-18 MED ORDER — ALBUMIN HUMAN 25 % IV SOLN
INTRAVENOUS | Status: AC
  Filled 2022-11-18: qty 100

## 2022-11-18 MED ORDER — ALBUMIN HUMAN 5 % IV SOLN
12.5000 g | Freq: Once | INTRAVENOUS | Status: AC
  Administered 2022-11-18: 12.5 g via INTRAVENOUS

## 2022-11-18 MED ORDER — LIDOCAINE HCL (PF) 1 % IJ SOLN
15.0000 mL | Freq: Once | INTRAMUSCULAR | Status: AC
  Administered 2022-11-18: 15 mL via INTRADERMAL
  Filled 2022-11-18: qty 15

## 2022-11-18 MED ORDER — ALBUMIN HUMAN 25 % IV SOLN
INTRAVENOUS | Status: AC
  Administered 2022-11-18: 25 g via INTRAVENOUS
  Filled 2022-11-18: qty 100

## 2022-11-19 ENCOUNTER — Inpatient Hospital Stay: Payer: Medicare Other | Attending: Hematology | Admitting: Hematology

## 2022-11-19 ENCOUNTER — Inpatient Hospital Stay: Payer: Medicare Other

## 2022-11-19 DIAGNOSIS — D631 Anemia in chronic kidney disease: Secondary | ICD-10-CM

## 2022-11-19 DIAGNOSIS — D61818 Other pancytopenia: Secondary | ICD-10-CM

## 2022-11-19 DIAGNOSIS — D500 Iron deficiency anemia secondary to blood loss (chronic): Secondary | ICD-10-CM

## 2022-11-19 DIAGNOSIS — D696 Thrombocytopenia, unspecified: Secondary | ICD-10-CM | POA: Diagnosis not present

## 2022-11-19 DIAGNOSIS — K7460 Unspecified cirrhosis of liver: Secondary | ICD-10-CM | POA: Diagnosis not present

## 2022-11-19 DIAGNOSIS — E538 Deficiency of other specified B group vitamins: Secondary | ICD-10-CM | POA: Diagnosis not present

## 2022-11-19 DIAGNOSIS — N1832 Chronic kidney disease, stage 3b: Secondary | ICD-10-CM

## 2022-11-19 DIAGNOSIS — E559 Vitamin D deficiency, unspecified: Secondary | ICD-10-CM | POA: Diagnosis not present

## 2022-11-19 DIAGNOSIS — D561 Beta thalassemia: Secondary | ICD-10-CM

## 2022-11-19 DIAGNOSIS — I129 Hypertensive chronic kidney disease with stage 1 through stage 4 chronic kidney disease, or unspecified chronic kidney disease: Secondary | ICD-10-CM | POA: Diagnosis not present

## 2022-11-19 DIAGNOSIS — N189 Chronic kidney disease, unspecified: Secondary | ICD-10-CM | POA: Diagnosis not present

## 2022-11-19 LAB — CBC WITH DIFFERENTIAL/PLATELET
Abs Immature Granulocytes: 0.01 10*3/uL (ref 0.00–0.07)
Basophils Absolute: 0 10*3/uL (ref 0.0–0.1)
Basophils Relative: 1 %
Eosinophils Absolute: 0.2 10*3/uL (ref 0.0–0.5)
Eosinophils Relative: 9 %
HCT: 22.9 % — ABNORMAL LOW (ref 36.0–46.0)
Hemoglobin: 7.2 g/dL — ABNORMAL LOW (ref 12.0–15.0)
Immature Granulocytes: 0 %
Lymphocytes Relative: 30 %
Lymphs Abs: 0.8 10*3/uL (ref 0.7–4.0)
MCH: 25.9 pg — ABNORMAL LOW (ref 26.0–34.0)
MCHC: 31.4 g/dL (ref 30.0–36.0)
MCV: 82.4 fL (ref 80.0–100.0)
Monocytes Absolute: 0.3 10*3/uL (ref 0.1–1.0)
Monocytes Relative: 13 %
Neutro Abs: 1.3 10*3/uL — ABNORMAL LOW (ref 1.7–7.7)
Neutrophils Relative %: 47 %
Platelets: 113 10*3/uL — ABNORMAL LOW (ref 150–400)
RBC: 2.78 MIL/uL — ABNORMAL LOW (ref 3.87–5.11)
RDW: 19.2 % — ABNORMAL HIGH (ref 11.5–15.5)
WBC: 2.7 10*3/uL — ABNORMAL LOW (ref 4.0–10.5)
nRBC: 0 % (ref 0.0–0.2)

## 2022-11-19 LAB — SAMPLE TO BLOOD BANK

## 2022-11-19 LAB — PREPARE RBC (CROSSMATCH)

## 2022-11-20 ENCOUNTER — Other Ambulatory Visit: Payer: Self-pay | Admitting: Physician Assistant

## 2022-11-20 ENCOUNTER — Inpatient Hospital Stay: Payer: Medicare Other

## 2022-11-20 DIAGNOSIS — D500 Iron deficiency anemia secondary to blood loss (chronic): Secondary | ICD-10-CM | POA: Diagnosis not present

## 2022-11-20 DIAGNOSIS — K7460 Unspecified cirrhosis of liver: Secondary | ICD-10-CM | POA: Diagnosis not present

## 2022-11-20 DIAGNOSIS — D696 Thrombocytopenia, unspecified: Secondary | ICD-10-CM | POA: Diagnosis not present

## 2022-11-20 DIAGNOSIS — D561 Beta thalassemia: Secondary | ICD-10-CM | POA: Diagnosis not present

## 2022-11-20 DIAGNOSIS — N189 Chronic kidney disease, unspecified: Secondary | ICD-10-CM | POA: Diagnosis not present

## 2022-11-20 DIAGNOSIS — D631 Anemia in chronic kidney disease: Secondary | ICD-10-CM | POA: Diagnosis not present

## 2022-11-20 DIAGNOSIS — E538 Deficiency of other specified B group vitamins: Secondary | ICD-10-CM | POA: Diagnosis not present

## 2022-11-20 DIAGNOSIS — I129 Hypertensive chronic kidney disease with stage 1 through stage 4 chronic kidney disease, or unspecified chronic kidney disease: Secondary | ICD-10-CM | POA: Diagnosis not present

## 2022-11-20 DIAGNOSIS — D649 Anemia, unspecified: Secondary | ICD-10-CM

## 2022-11-20 DIAGNOSIS — E559 Vitamin D deficiency, unspecified: Secondary | ICD-10-CM | POA: Diagnosis not present

## 2022-11-20 MED ORDER — DIPHENHYDRAMINE HCL 25 MG PO CAPS: 25.0000 mg | ORAL_CAPSULE | Freq: Once | ORAL | Status: DC

## 2022-11-20 MED ORDER — SODIUM CHLORIDE 0.9% IV SOLUTION
250.0000 mL | Freq: Once | INTRAVENOUS | Status: AC
  Administered 2022-11-20: 250 mL via INTRAVENOUS

## 2022-11-20 MED ORDER — ACETAMINOPHEN 325 MG PO TABS: 650.0000 mg | ORAL_TABLET | Freq: Once | ORAL | Status: DC

## 2022-11-20 NOTE — Progress Notes (Signed)
One unit of blood given per orders. Patient tolerated it well without problems. Vitals stable and discharged home from clinic via wheelchair. Follow up as scheduled.  

## 2022-11-20 NOTE — Patient Instructions (Signed)
[facility]  Discharge Instructions: Thank you for choosing [facility] to provide your oncology and hematology care.  If you have a lab appointment with the [facility] - please note that after April 8th, 2024, all labs will be drawn in the cancer center.  You do not have to check in or register with the main entrance as you have in the past but will complete your check-in in the cancer center.  Wear comfortable clothing and clothing appropriate for easy access to any Portacath or PICC line.   We strive to give you quality time with your provider. You may need to reschedule your appointment if you arrive late (15 or more minutes).  Arriving late affects you and other patients whose appointments are after yours.  Also, if you miss three or more appointments without notifying the office, you may be dismissed from the clinic at the provider's discretion.      For prescription refill requests, have your pharmacy contact our office and allow 72 hours for refills to be completed.    Today you received one unit of blood   To help prevent nausea and vomiting after your treatment, we encourage you to take your nausea medication as directed.  BELOW ARE SYMPTOMS THAT SHOULD BE REPORTED IMMEDIATELY: *FEVER GREATER THAN 100.4 F (38 C) OR HIGHER *CHILLS OR SWEATING *NAUSEA AND VOMITING THAT IS NOT CONTROLLED WITH YOUR NAUSEA MEDICATION *UNUSUAL SHORTNESS OF BREATH *UNUSUAL BRUISING OR BLEEDING *URINARY PROBLEMS (pain or burning when urinating, or frequent urination) *BOWEL PROBLEMS (unusual diarrhea, constipation, pain near the anus) TENDERNESS IN MOUTH AND THROAT WITH OR WITHOUT PRESENCE OF ULCERS (sore throat, sores in mouth, or a toothache) UNUSUAL RASH, SWELLING OR PAIN  UNUSUAL VAGINAL DISCHARGE OR ITCHING   Items with * indicate a potential emergency and should be followed up as soon as possible or go to the Emergency Department if any problems should  occur.  Please show the CHEMOTHERAPY ALERT CARD or IMMUNOTHERAPY ALERT CARD at check-in to the Emergency Department and triage nurse.  Should you have questions after your visit or need to cancel or reschedule your appointment, please contact [facility] [phone]  and follow the prompts.  Office hours are 8:00 a.m. to 4:30 p.m. Monday - Friday. Please note that voicemails left after 4:00 p.m. may not be returned until the following business day.  We are closed weekends and major holidays. You have access to a nurse at all times for urgent questions. Please call the main number to the clinic [phone] and follow the prompts.  For any non-urgent questions, you may also contact your provider using MyChart. We now offer e-Visits for anyone 57 and older to request care online for non-urgent symptoms. For details visit [email or web address].   Also download the MyChart app! Go to the app store, search "MyChart", open the app, select [facility], and log in with your MyChart username and password.

## 2022-11-21 LAB — BPAM RBC
Blood Product Expiration Date: 202404192359
ISSUE DATE / TIME: 202404041031
Unit Type and Rh: 6200

## 2022-11-21 LAB — TYPE AND SCREEN
ABO/RH(D): A POS
Antibody Screen: NEGATIVE
Unit division: 0

## 2022-11-22 LAB — CULTURE, BLOOD (ROUTINE X 2)
Culture: NO GROWTH
Culture: NO GROWTH
Special Requests: ADEQUATE
Special Requests: ADEQUATE

## 2022-11-24 ENCOUNTER — Other Ambulatory Visit: Payer: Self-pay | Admitting: Student

## 2022-11-24 ENCOUNTER — Ambulatory Visit (HOSPITAL_COMMUNITY): Payer: Medicare Other | Attending: Gastroenterology | Admitting: Physical Therapy

## 2022-11-24 DIAGNOSIS — R262 Difficulty in walking, not elsewhere classified: Secondary | ICD-10-CM | POA: Insufficient documentation

## 2022-11-24 DIAGNOSIS — D631 Anemia in chronic kidney disease: Secondary | ICD-10-CM

## 2022-11-24 DIAGNOSIS — R2689 Other abnormalities of gait and mobility: Secondary | ICD-10-CM | POA: Insufficient documentation

## 2022-11-24 DIAGNOSIS — M6281 Muscle weakness (generalized): Secondary | ICD-10-CM | POA: Insufficient documentation

## 2022-11-24 DIAGNOSIS — E1169 Type 2 diabetes mellitus with other specified complication: Secondary | ICD-10-CM

## 2022-11-24 NOTE — Therapy (Signed)
OUTPATIENT PHYSICAL THERAPY LOWER EXTREMITY EVALUATION   Patient Name: Rachel Huber A Conkle MRN: [id number] DOB:04/06/1956, 67 y.o., female Today's Date: 11/24/2022  END OF SESSION:  PT End of Session - 11/24/22 1128     Visit Number 1    Number of Visits 16    Date for PT Re-Evaluation 01/19/23    Authorization Type Medicare A/ [employer]ET[employer] CVA 2ndary    Progress Note Due on Visit 10    PT Start Time 1124    PT Stop Time 1155    PT Time Calculation (min) 31 min    Activity Tolerance Patient tolerated treatment well    Behavior During Therapy WFL for tasks assessed/performed             Past Medical History:  Diagnosis Date   Anemia in chronic kidney disease (CKD) 02/07/2022   Anxiety    Arthritis    Cirrhosis of liver    Diabetes mellitus without complication    Dyspnea    DOE   GERD (gastroesophageal reflux disease)    Grade IV internal hemorrhoids    Contingency plan for any future admissions for severe anemia in the setting of persistent GI bleed:  nuclear medicine tagged RBC scan to assess for location of GI bleeding - if from rectum, would then proceed to angiogram with possible repeat embolization.  Please notify IR in this case.   Hepatitis    PAST   Hypertension    Neuropathy    Neuropathy, diabetic    Pneumonia    Sinus complaint    Sleep apnea    CPAP   Thalassemia minor 1992   Past Surgical History:  Procedure Laterality Date   ABDOMINAL HYSTERECTOMY     BREAST BIOPSY Right 02/15/2018   us bx 6-6:30 ribbon shape, ONE CORE FRAGMENT WITH FIBROSIS. ONE CORE FRAGMENT WITH PORTION OF A DILATED   BREAST BIOPSY Right 02/15/2018   us bx 9:00 heart shape, USUAL DUCTAL HYPERPLASIA   BREAST LUMPECTOMY Right 03/09/2018   Procedure: BREAST LUMPECTOMY x 2;  Surgeon: Sung AmabileSakai, Isami, DO;  Location: [facility];  Service: General;  Laterality: Right;   CATARACT EXTRACTION W/PHACO Left 06/11/2016   Procedure: CATARACT EXTRACTION PHACO AND INTRAOCULAR LENS PLACEMENT (IOC);  Surgeon:  Sallee LangeSteven Dingeldein, MD;  Location: [facility];  Service: Ophthalmology;  Laterality: Left;  Lot # [id number] H US:01:38.6 AP%:26.4 CDE:44.15   CATARACT EXTRACTION W/PHACO Right 06/15/2018   Procedure: CATARACT EXTRACTION PHACO AND INTRAOCULAR LENS PLACEMENT (IOC);  Surgeon: Galen ManilaPorfilio, William, MD;  Location: [facility];  Service: Ophthalmology;  Laterality: Right;  US 00:38.2 CDE 4.23 Fluid Pack Lot # [id number]   COLONOSCOPIES     COLONOSCOPY WITH PROPOFOL N/A 10/10/2020   Procedure: COLONOSCOPY WITH PROPOFOL;  Surgeon: Regis BillLocklear, Emilija Bohman T, MD;  [facility];  Service: Endoscopy;  Laterality: N/A;   COLONOSCOPY WITH PROPOFOL N/A 11/20/2020   Procedure: COLONOSCOPY WITH PROPOFOL;  Surgeon: Regis BillLocklear, Aleya Durnell T, MD;  Location: [facility] ENDOSCOPY;  Service: Endoscopy;  Laterality: N/A;  DM STAT CBC, BMP COVID POSITIVE 09/02/2020   COLONOSCOPY WITH PROPOFOL N/A 06/19/2022   Procedure: COLONOSCOPY WITH PROPOFOL;  Surgeon: Regis BillLocklear, Talitha Dicarlo T, MD;  Location: [facility] ENDOSCOPY;  Service: Endoscopy;  Laterality: N/A;   CTR     ESOPHAGOGASTRODUODENOSCOPY (EGD) WITH PROPOFOL N/A 10/10/2020   Procedure: ESOPHAGOGASTRODUODENOSCOPY (EGD) WITH PROPOFOL;  Surgeon: Regis BillLocklear, Neizan Debruhl T, MD;  Location: [facility] ENDOSCOPY;  Service: Endoscopy;  Laterality: N/A;  COVID POSITIVE 10/08/2020   FLEXIBLE SIGMOIDOSCOPY N/A 02/06/2022   Procedure: FLEXIBLE SIGMOIDOSCOPY;  Surgeon: Regis Bill, MD;  Location: [facility] ENDOSCOPY;  Service: Endoscopy;  Laterality: N/A;  Patient requests anesthesia   IR ANGIOGRAM SELECTIVE EACH ADDITIONAL VESSEL  02/07/2022   IR ANGIOGRAM SELECTIVE EACH ADDITIONAL VESSEL  02/07/2022   IR ANGIOGRAM SELECTIVE EACH ADDITIONAL VESSEL  09/05/2022   IR ANGIOGRAM VISCERAL SELECTIVE  02/07/2022   IR ANGIOGRAM VISCERAL SELECTIVE  09/05/2022   IR EMBO ART  VEN HEMORR LYMPH EXTRAV  INC GUIDE ROADMAPPING  09/05/2022   IR EMBO ART  VEN HEMORR LYMPH EXTRAV  INC GUIDE ROADMAPPING  11/05/2022   IR EMBO ARTERIAL NOT HEMORR  HEMANG INC GUIDE ROADMAPPING  02/07/2022   IR IMAGING GUIDED PORT INSERTION  06/13/2022   IR PARACENTESIS  12/17/2021   IR PARACENTESIS  03/25/2022   IR RADIOLOGIST EVAL & MGMT  03/18/2022   IR RADIOLOGIST EVAL & MGMT  05/07/2022   IR RADIOLOGIST EVAL & MGMT  05/16/2022   IR RADIOLOGIST EVAL & MGMT  10/13/2022   IR US GUIDE VASC ACCESS RIGHT  02/07/2022   IR US GUIDE VASC ACCESS RIGHT  09/05/2022   JOINT REPLACEMENT     KNEE SURGERY Right 09/24/2017   plates and pins   PORT-A-CATH REMOVAL N/A 09/23/2022   Procedure: MINOR REMOVAL PORT-A-CATH;  Surgeon: Franky Macho, MD;  Location: [facility];  Service: General;  Laterality: N/A;   TEE WITHOUT CARDIOVERSION N/A 09/23/2022   Procedure: TRANSESOPHAGEAL ECHOCARDIOGRAM (TEE);  Surgeon: Antoine Poche, MD;  Location: [facility];  Service: Endoscopy;  Laterality: N/A;   TOTAL SHOULDER ARTHROPLASTY Right 09/27/2015   Patient Active Problem List   Diagnosis Date Noted   Hypokalemia 11/06/2022   BRBPR (bright red blood per rectum) 11/03/2022   Acute on chronic anemia 11/03/2022   Hyperkalemia 11/03/2022   Hypotension 11/03/2022   Acute renal failure (ARF) 10/16/2022   AKI (acute kidney injury) 10/15/2022   Bacteremia 09/21/2022   SIRS (systemic inflammatory response syndrome) 09/18/2022   Sepsis 09/18/2022   Rectal bleeding 06/17/2022   Obesity (BMI 30-39.9) 06/17/2022   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 mellitus 06/17/2022   Grade IV internal hemorrhoids 05/16/2022   Acute on chronic blood loss anemia 03/05/2022   Thrombocytopenia 03/05/2022   Fever 02/08/2022   Anemia in chronic kidney disease (CKD) 02/07/2022   GI bleeding 02/04/2022   Chronic kidney disease, stage 3b 02/04/2022   Depression with anxiety 02/04/2022   Liver cirrhosis secondary to NASH    Insomnia 01/27/2022   Iron deficiency anemia due to chronic blood loss 02/13/2021   History of uterine cancer 02/13/2021   Decompensated hepatic cirrhosis 10/25/2020   OSA on CPAP 10/25/2020    Glossitis 10/25/2020   Hyponatremia 10/08/2020   Acute respiratory failure with hypoxia 09/10/2020   Pneumonia due to COVID-19 virus 09/08/2020   Hip fracture, right 09/04/2020   Closed right hip fracture 09/02/2020   Type 2 diabetes mellitus with hyperlipidemia 09/02/2020   Thalassemia minor    HLD (hyperlipidemia)    Acute kidney injury superimposed on CKD    Depression    Nondisplaced fracture of greater trochanter of right femur, initial encounter for closed fracture     PCP: Einar Crow MD  REFERRING PROVIDER: Kristine Linea, NP  REFERRING DIAG: R26.9 (ICD-10-CM) - Unspecified abnormalities of gait and mobility  THERAPY DIAG:  Muscle weakness (generalized) - Plan: PT plan of care cert/re-cert  Other abnormalities of gait and mobility - Plan: PT plan of care cert/re-cert  Rationale for Evaluation and Treatment:  Rehabilitation  ONSET DATE: Chronic  SUBJECTIVE:   SUBJECTIVE STATEMENT: Patient presents to therapy with complaint of LE weakness and gait difficulty. She is previously known to this clinic. She says she is doing better than before but she was in the hospital for the better part of February due to altered labs and subsequent infection. She is now slated to have kidney and liver transplants coming up but does not yet have a set date.   PERTINENT HISTORY: Falls, weakness, DM, R shoulder THA Feb 2019 R knee pain (OA)  Cirrhosis   PAIN:  Are you having pain? no  PRECAUTIONS: Fall  WEIGHT BEARING RESTRICTIONS: No  FALLS:  Has patient fallen in last 6 months? No  LIVING ENVIRONMENT: Lives with: lives with their family and lives with their spouse Lives in: House/apartment Stairs: No Has following equipment at home: Single point cane and Environmental consultant - 2 wheeled  OCCUPATION: Retired/ Disability     PLOF: Needs assistance with ADLs  PATIENT GOALS: To get stronger for surgery   NEXT MD VISIT: 01/21/23  OBJECTIVE:   DIAGNOSTIC FINDINGS:  NA  COGNITION: Overall cognitive status: Within functional limits for tasks assessed     SENSATION: Not tested  LOWER EXTREMITY MMT: (unable to lay prone)   MMT Right eval Left eval  Hip flexion 4 4  Hip extension    Hip abduction 4- 4  Hip adduction    Hip internal rotation    Hip external rotation    Knee flexion    Knee extension 4 4  Ankle dorsiflexion 4+ 4  Ankle plantarflexion    Ankle inversion    Ankle eversion     (Blank rows = not tested)   FUNCTIONAL TESTS:  5 times sit to stand: Test next session  2 minute walk test: 60 feet in 1 minute using cane   GAIT:  Decreased stride, trunk flexed, slow speed, using RW  TODAY'S TREATMENT:                                                                                                                              DATE:  11/24/22 Eval    PATIENT EDUCATION:  Education details: on Eval findings, POC and HEP Person educated: Patient Education method: Explanation Education comprehension: verbalized understanding  HOME EXERCISE PROGRAM: Access Code: [email or web address] URL: [email or web address] Date: 11/24/2022 Prepared by: Georges Lynch  Exercises - Seated Heel Toe Raises  - 2 x daily - 7 x weekly - 1 sets - 10 reps - Seated Long Arc Quad  - 2 x daily - 7 x weekly - 1 sets - 10 reps - Seated March  - 2 x daily - 7 x weekly - 1 sets - 10 reps - Supine Active Straight Leg Raise  - 7 x weekly - 1 sets - 10 reps - Heel Raises with Counter Support  - 2 x daily - 7 x weekly - 1 sets - 10 reps - 3-5"  hold - Mini Squat with Counter Support  - 2 x daily - 7 x weekly - 1 sets - 10 reps - 3-5" hold - Sit to Stand with Counter Support  - 2 x daily - 7 x weekly - 1 sets - 10 reps  ASSESSMENT:  CLINICAL IMPRESSION: Patient is a 67 y.o. female who presents to physical therapy with complaint of LE weakness and altered gait. Patient demonstrates decreased strength, balance deficits and gait abnormalities which are  negatively impacting patient ability to perform ADLs and functional mobility tasks. Patient will benefit from skilled physical therapy services to address these deficits to improve level of function with ADLs, functional mobility tasks, and reduce risk for falls.    OBJECTIVE IMPAIRMENTS: Abnormal gait, decreased activity tolerance, decreased balance, decreased endurance, decreased mobility, difficulty walking, decreased strength, dizziness, hypomobility, and improper body mechanics.   ACTIVITY LIMITATIONS: carrying, lifting, bending, sitting, standing, squatting, stairs, transfers, bed mobility, and locomotion level  PARTICIPATION LIMITATIONS: meal prep, cleaning, laundry, driving, shopping, community activity, and yard work  PERSONAL FACTORS: Past/current experiences, Time since onset of injury/illness/exacerbation, and 3+ comorbidities: See history  are also affecting patient's functional outcome.   REHAB POTENTIAL: Fair See above  CLINICAL DECISION MAKING: Stable/uncomplicated  EVALUATION COMPLEXITY: Low   GOALS: SHORT TERM GOALS: Target date: 12/22/2022  Patient will be independent with initial HEP and self-management strategies to improve functional outcomes Baseline:  Goal status: INITIAL   2.  Patient will be able to perform stand x 5 in < 20 seconds to demonstrate improvement in functional mobility and reduced risk for falls.  Baseline:  Goal status: INITIAL  LONG TERM GOALS: Target date: 01/19/2023  Patient will be independent with advanced HEP and self-management strategies to improve functional outcomes Baseline:  Goal status: INITIAL  2.  Patient will be able to ambulate at least 250 feet during with LRAD to demonstrate improved ability to perform functional mobility and associated tasks. Baseline:60 feet in 1 minute using cane  Goal status: INITIAL  3.  Patient will be able to perform stand x 5 in < 15 seconds to demonstrate improvement in functional mobility and  reduced risk for falls.  Baseline:  Goal status: INITIAL  4. Patient will have equal to or > 4+/5 MMT throughout tested BLE to improve ability to perform functional mobility, stair ambulation and ADLs.  Baseline: See MMT Goal status: INITIAL  PLAN:  PT FREQUENCY: 2x/week  PT DURATION: 8 weeks  PLANNED INTERVENTIONS: Therapeutic exercises, Therapeutic activity, Neuromuscular re-education, Balance training, Gait training, Patient/Family education, Joint manipulation, Joint mobilization, Stair training, Aquatic Therapy, Dry Needling, Electrical stimulation, Spinal manipulation, Spinal mobilization, Cryotherapy, Moist heat, scar mobilization, Taping, Traction, Ultrasound, Biofeedback, Ionotophoresis 4mg /ml Dexamethasone, and Manual therapy.   PLAN FOR NEXT SESSION: Test 5 x STS. Progress LE and core strength as tolerated. Gait and balance progressions as able.   11:53 AM, 11/24/22 Georges Lynch PT DPT  Physical Therapist with [facility]  [phone]

## 2022-11-24 NOTE — Progress Notes (Signed)
Patient for IR Port Insertion and IR Paracentesis on Tues 11/25/2022, I called and spoke with the patient on the phone and gave pre-procedure instructions. Pt was made aware to be here at 10a, NPO after MN prior to procedure as well as driver post procedure/recovery/discharge. Pt stated understanding.  Called 11/24/2022

## 2022-11-25 ENCOUNTER — Encounter: Payer: Self-pay | Admitting: Radiology

## 2022-11-25 ENCOUNTER — Ambulatory Visit
Admission: RE | Admit: 2022-11-25 | Discharge: 2022-11-25 | Disposition: A | Discharge status: Home / Self Care | Payer: Medicare Other | Source: Ambulatory Visit | Attending: Physician Assistant | Admitting: Physician Assistant

## 2022-11-25 ENCOUNTER — Ambulatory Visit
Admission: RE | Admit: 2022-11-25 | Discharge: 2022-11-25 | Disposition: A | Discharge status: Home / Self Care | Payer: Medicare Other | Source: Ambulatory Visit | Attending: Gastroenterology | Admitting: Gastroenterology

## 2022-11-25 DIAGNOSIS — D649 Anemia, unspecified: Secondary | ICD-10-CM | POA: Insufficient documentation

## 2022-11-25 DIAGNOSIS — K766 Portal hypertension: Secondary | ICD-10-CM | POA: Insufficient documentation

## 2022-11-25 DIAGNOSIS — E1169 Type 2 diabetes mellitus with other specified complication: Secondary | ICD-10-CM

## 2022-11-25 DIAGNOSIS — Z452 Encounter for adjustment and management of vascular access device: Secondary | ICD-10-CM | POA: Diagnosis not present

## 2022-11-25 DIAGNOSIS — K7460 Unspecified cirrhosis of liver: Secondary | ICD-10-CM

## 2022-11-25 DIAGNOSIS — R188 Other ascites: Secondary | ICD-10-CM | POA: Insufficient documentation

## 2022-11-25 DIAGNOSIS — K7581 Nonalcoholic steatohepatitis (NASH): Secondary | ICD-10-CM | POA: Diagnosis not present

## 2022-11-25 DIAGNOSIS — D631 Anemia in chronic kidney disease: Secondary | ICD-10-CM

## 2022-11-25 HISTORY — PX: IR PARACENTESIS: IMG2679

## 2022-11-25 HISTORY — PX: IR IMAGING GUIDED PORT INSERTION: IMG5740

## 2022-11-25 MED ORDER — FENTANYL CITRATE (PF) 100 MCG/2ML IJ SOLN
INTRAMUSCULAR | Status: AC | PRN
  Administered 2022-11-25 (×2): 25 ug via INTRAVENOUS
  Administered 2022-11-25: 50 ug via INTRAVENOUS

## 2022-11-25 MED ORDER — FENTANYL CITRATE (PF) 100 MCG/2ML IJ SOLN
INTRAMUSCULAR | Status: AC
  Filled 2022-11-25: qty 2

## 2022-11-25 MED ORDER — LIDOCAINE-EPINEPHRINE 1 %-1:100000 IJ SOLN
INTRAMUSCULAR | Status: AC
  Filled 2022-11-25: qty 1

## 2022-11-25 MED ORDER — HEPARIN SOD (PORK) LOCK FLUSH 100 UNIT/ML IV SOLN
500.0000 [IU] | Freq: Once | INTRAVENOUS | Status: AC
  Administered 2022-11-25: 500 [IU] via INTRAVENOUS

## 2022-11-25 MED ORDER — HEPARIN SOD (PORK) LOCK FLUSH 100 UNIT/ML IV SOLN
INTRAVENOUS | Status: AC
  Filled 2022-11-25: qty 5

## 2022-11-25 MED ORDER — MIDAZOLAM HCL 2 MG/2ML IJ SOLN
INTRAMUSCULAR | Status: AC | PRN
  Administered 2022-11-25: 1 mg via INTRAVENOUS

## 2022-11-25 MED ORDER — MIDAZOLAM HCL 2 MG/2ML IJ SOLN
INTRAMUSCULAR | Status: AC
  Filled 2022-11-25: qty 2

## 2022-11-25 MED ORDER — LIDOCAINE-EPINEPHRINE 1 %-1:100000 IJ SOLN
10.0000 mL | Freq: Once | INTRAMUSCULAR | Status: AC
  Administered 2022-11-25: 10 mL via INTRADERMAL

## 2022-11-25 MED ORDER — MIDAZOLAM HCL 5 MG/5ML IJ SOLN
INTRAMUSCULAR | Status: AC | PRN
  Administered 2022-11-25 (×2): .5 mg via INTRAVENOUS

## 2022-11-25 MED ORDER — LIDOCAINE-EPINEPHRINE 1 %-1:100000 IJ SOLN
20.0000 mL | Freq: Once | INTRAMUSCULAR | Status: AC
  Administered 2022-11-25: 18 mL via INTRADERMAL

## 2022-11-25 MED ORDER — SODIUM CHLORIDE 0.9 % IV SOLN: INTRAVENOUS | Status: DC

## 2022-11-25 NOTE — Discharge Instructions (Signed)
Implanted Port Home Guide  An implanted port is a type of central line that is placed under the skin. Central lines are used to provide IV access when treatment or nutrition needs to be given through a person's veins. Implanted ports are used for long-term IV access. An implanted port may be placed because: You need IV medicine that would be irritating to the small veins in your hands or arms. You need long-term IV medicines, such as antibiotics. You need IV nutrition for a long period. You need frequent blood draws for lab tests. You need dialysis.   Implanted ports are usually placed in the chest area, but they can also be placed in the upper arm, the abdomen, or the leg. An implanted port has two main parts: Reservoir. The reservoir is round and will appear as a small, raised area under your skin. The reservoir is the part where a needle is inserted to give medicines or draw blood. Catheter. The catheter is a thin, flexible tube that extends from the reservoir. The catheter is placed into a large vein. Medicine that is inserted into the reservoir goes into the catheter and then into the vein.   How will I care for my incision  You may shower tomorrow  How is my port accessed? Special steps must be taken to access the port: Before the port is accessed, a numbing cream can be placed on the skin. This helps numb the skin over the port site. Your health care provider uses a sterile technique to access the port. Your health care provider must put on a mask and sterile gloves. The skin over your port is cleaned carefully with an antiseptic and allowed to dry. The port is gently pinched between sterile gloves, and a needle is inserted into the port. Only "non-coring" port needles should be used to access the port. Once the port is accessed, a blood return should be checked. This helps ensure that the port is in the vein and is not clogged. If your port needs to remain accessed for a constant  infusion, a clear (transparent) bandage will be placed over the needle site. The bandage and needle will need to be changed every week, or as directed by your health care provider.   What is flushing? Flushing helps keep the port from getting clogged. Follow your health care provider's instructions on how and when to flush the port. Ports are usually flushed with saline solution or a medicine called heparin. The need for flushing will depend on how the port is used. If the port is used for intermittent medicines or blood draws, the port will need to be flushed: After medicines have been given. After blood has been drawn. As part of routine maintenance. If a constant infusion is running, the port may not need to be flushed.   How long will my port stay implanted? The port can stay in for as long as your health care provider thinks it is needed. When it is time for the port to come out, surgery will be done to remove it. The procedure is similar to the one performed when the port was put in. When should I seek immediate medical care? When you have an implanted port, you should seek immediate medical care if: You notice a bad smell coming from the incision site. You have swelling, redness, or drainage at the incision site. You have more swelling or pain at the port site or the surrounding area. You have a fever that   is not controlled with medicine.   This information is not intended to replace advice given to you by your health care provider. Make sure you discuss any questions you have with your health care provider. Document Released: 08/04/2005 Document Revised: 01/10/2016 Document Reviewed: 04/11/2013 Elsevier Interactive Patient Education  2017 [employer]    

## 2022-11-25 NOTE — Procedures (Signed)
Interventional Radiology Procedure Note  Procedure: RT IJ POWER PORT  LG VOL PARA    Complications: None  Estimated Blood Loss:  MIN  Findings: TIP SVCRA    Sharen Counter, MD

## 2022-11-25 NOTE — H&P (Signed)
Chief Complaint: Patient was seen in consultation today for image-guided port placement and paracentesis  Referring Physician(s): Locklear,Cameron T  Supervising Physician: Ruel Favors  Patient Status: [facility] - Out-pt  History of Present Illness: Rachel Huber is a 67 y.o. 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 Patient requires frequent blood transfusions and has poor venous access. Patient initially had port placed 06/13/22, but during a recent hospitalization, the port had to be removed due to sepsis. Patient presents today for new image-guided port placement. Patient additionally presents for her weekly paracentesis.  Past Medical History:  Diagnosis Date   Anemia in chronic kidney disease (CKD) 02/07/2022   Anxiety    Arthritis    Cirrhosis of liver    Diabetes mellitus without complication    Dyspnea    DOE   GERD (gastroesophageal reflux disease)    Grade IV internal hemorrhoids    Contingency plan for any future admissions for severe anemia in the setting of persistent GI bleed:  nuclear medicine tagged RBC scan to assess for location of GI bleeding - if from rectum, would then proceed to angiogram with possible repeat embolization.  Please notify IR in this case.   Hepatitis    PAST   Hypertension    Neuropathy    Neuropathy, diabetic    Pneumonia    Sinus complaint    Sleep apnea    CPAP   Thalassemia minor 1992    Past Surgical History:  Procedure Laterality Date   ABDOMINAL HYSTERECTOMY     BREAST BIOPSY Right 02/15/2018   [place] bx 6-6:30 ribbon shape, ONE CORE FRAGMENT WITH FIBROSIS. ONE CORE FRAGMENT WITH PORTION OF A DILATED   BREAST  BIOPSY Right 02/15/2018   [place] bx 9:00 heart shape, USUAL DUCTAL HYPERPLASIA   BREAST LUMPECTOMY Right 03/09/2018   Procedure: BREAST LUMPECTOMY x 2;  Surgeon: Sung Amabile, DO;  Location: [facility] ORS;  Service: General;  Laterality: Right;   CATARACT EXTRACTION W/PHACO Left 06/11/2016   Procedure: CATARACT EXTRACTION PHACO AND INTRAOCULAR LENS PLACEMENT (IOC);  Surgeon: Sallee Lange, MD;  Location: [facility] ORS;  Service: Ophthalmology;  Laterality: Left;  Lot # [id number] H US:01:38.6 AP%:26.4 CDE:44.15   CATARACT EXTRACTION W/PHACO Right 06/15/2018   Procedure: CATARACT EXTRACTION PHACO AND INTRAOCULAR LENS PLACEMENT (IOC);  Surgeon: Galen Manila, MD;  Location: [facility] ORS;  Service: Ophthalmology;  Laterality: Right;  [place] 00:38.2 CDE 4.23 Fluid Pack Lot # [id number]   COLONOSCOPIES     COLONOSCOPY WITH PROPOFOL N/A 10/10/2020   Procedure: COLONOSCOPY WITH PROPOFOL;  Surgeon: Regis Bill, MD;  Location: [facility] ENDOSCOPY;  Service: Endoscopy;  Laterality: N/A;   COLONOSCOPY WITH PROPOFOL N/A 11/20/2020   Procedure: COLONOSCOPY WITH PROPOFOL;  Surgeon: Regis Bill, MD;  Location: [facility] ENDOSCOPY;  Service: Endoscopy;  Laterality: N/A;  DM STAT CBC, BMP COVID POSITIVE 09/02/2020   COLONOSCOPY WITH PROPOFOL N/A 06/19/2022   Procedure: COLONOSCOPY WITH PROPOFOL;  Surgeon: Regis Bill, MD;  Location: [facility] ENDOSCOPY;  Service: Endoscopy;  Laterality: N/A;   CTR     ESOPHAGOGASTRODUODENOSCOPY (EGD) WITH PROPOFOL N/A 10/10/2020   Procedure: ESOPHAGOGASTRODUODENOSCOPY (EGD) WITH PROPOFOL;  Surgeon: Regis Bill, MD;  Location: [facility] ENDOSCOPY;  Service: Endoscopy;  Laterality: N/A;  COVID POSITIVE 10/08/2020  FLEXIBLE SIGMOIDOSCOPY N/A 02/06/2022   Procedure: FLEXIBLE SIGMOIDOSCOPY;  Surgeon: Regis BillLocklear, Cameron T, MD;  Location: [facility] ENDOSCOPY;  Service: Endoscopy;  Laterality: N/A;  Patient requests anesthesia   IR ANGIOGRAM SELECTIVE EACH ADDITIONAL VESSEL  02/07/2022   IR ANGIOGRAM  SELECTIVE EACH ADDITIONAL VESSEL  02/07/2022   IR ANGIOGRAM SELECTIVE EACH ADDITIONAL VESSEL  09/05/2022   IR ANGIOGRAM VISCERAL SELECTIVE  02/07/2022   IR ANGIOGRAM VISCERAL SELECTIVE  09/05/2022   IR EMBO ART  VEN HEMORR LYMPH EXTRAV  INC GUIDE ROADMAPPING  09/05/2022   IR EMBO ART  VEN HEMORR LYMPH EXTRAV  INC GUIDE ROADMAPPING  11/05/2022   IR EMBO ARTERIAL NOT HEMORR HEMANG INC GUIDE ROADMAPPING  02/07/2022   IR IMAGING GUIDED PORT INSERTION  06/13/2022   IR PARACENTESIS  12/17/2021   IR PARACENTESIS  03/25/2022   IR RADIOLOGIST EVAL & MGMT  03/18/2022   IR RADIOLOGIST EVAL & MGMT  05/07/2022   IR RADIOLOGIST EVAL & MGMT  05/16/2022   IR RADIOLOGIST EVAL & MGMT  10/13/2022   IR US GUIDE VASC ACCESS RIGHT  02/07/2022   IR US GUIDE VASC ACCESS RIGHT  09/05/2022   JOINT REPLACEMENT     KNEE SURGERY Right 09/24/2017   plates and pins   PORT-A-CATH REMOVAL N/A 09/23/2022   Procedure: MINOR REMOVAL PORT-A-CATH;  Surgeon: Franky MachoJenkins, Mark, MD;  Location: [facility] ORS;  Service: General;  Laterality: N/A;   TEE WITHOUT CARDIOVERSION N/A 09/23/2022   Procedure: TRANSESOPHAGEAL ECHOCARDIOGRAM (TEE);  Surgeon: Antoine PocheBranch, Jonathan F, MD;  Location: [facility] ORS;  Service: Endoscopy;  Laterality: N/A;   TOTAL SHOULDER ARTHROPLASTY Right 09/27/2015    Allergies: Gramineae pollens and Latex  Medications: Prior to Admission medications   Medication Sig Start Date End Date Taking? Authorizing Provider  acetaminophen (TYLENOL) 650 MG CR tablet Take 1,300 mg by mouth daily as needed for pain.    [provider]  albuterol (VENTOLIN HFA) 108 (90 Base) MCG/ACT inhaler Inhale 2 puffs into the lungs every 6 (six) hours as needed for wheezing or shortness of breath. 02/10/22   Alford HighlandWieting, Richard, MD  atorvastatin (LIPITOR) 10 MG tablet Take 1 tablet (10 mg total) by mouth daily. 06/20/22   Enedina FinnerPatel, Sona, MD  ciprofloxacin (CIPRO) 500 MG tablet Take 500 mg by mouth daily with breakfast.    [provider]  diphenhydrAMINE  (BENADRYL) 25 MG tablet Take 25 mg by mouth 2 (two) times daily as needed for itching.    [provider]  ergocalciferol (VITAMIN D2) 1.25 MG (50000 UT) capsule Take 1 capsule (50,000 Units total) by mouth every 14 (fourteen) days. 09/01/22   Carnella GuadalajaraPennington, Rebekah M, PA-C  gabapentin (NEURONTIN) 400 MG capsule Take 400 mg by mouth 2 (two) times daily. 01/07/18   [provider]  melatonin 5 MG TABS Take 10 mg by mouth at bedtime as needed (sleep).    [provider]  Multiple Vitamins-Minerals (MULTIVITAMIN WITH MINERALS) tablet Take 1 tablet by mouth daily.    [provider]  oxymetazoline (AFRIN) 0.05 % nasal spray Place 1 spray into both nostrils daily as needed for congestion.    [provider]  polyethylene glycol (MIRALAX / GLYCOLAX) 17 g packet Take 17 g by mouth 2 (two) times daily. 06/20/22   Enedina FinnerPatel, Sona, MD  potassium chloride SA (KLOR-CON M) 20 MEQ tablet Take 1 tablet (20 mEq total) by mouth 2 (two) times daily. 10/26/22   Catarina Hartshornat, David, MD  sertraline (ZOLOFT) 50 MG tablet Take 50 mg by  mouth daily. 12/12/17   [provider]  spironolactone (ALDACTONE) 25 MG tablet Take 1 tablet (25 mg total) by mouth daily. 10/26/22   Catarina Hartshorn, MD  torsemide 40 MG TABS Take 40 mg by mouth daily. 10/26/22   Catarina Hartshorn, MD  vitamin B-12 (CYANOCOBALAMIN) 1000 MCG tablet Take 1,000 mcg by mouth daily.    [provider]  witch hazel-glycerin (TUCKS) pad Apply topically as needed for hemorrhoids. 03/07/22   Alford Highland, MD     Family History  Problem Relation Age of Onset   Breast cancer Mother 51   Lymphoma Mother    Diabetes Father    Kidney cancer Father    Heart disease Father    Diabetes Sister    Breast cancer Sister 63    Social History   Socioeconomic History   Marital status: Married    Spouse name: Not on file   Number of children: 0   Years of education: Not on file   Highest education level: Not on file  Occupational  History   Occupation: EMPLOYED  Tobacco Use   Smoking status: Never   Smokeless tobacco: Never  Vaping Use   Vaping Use: Never used  Substance and Sexual Activity   Alcohol use: No   Drug use: Never   Sexual activity: Not Currently  Other Topics Concern   Not on file  Social History Narrative   Not on file   Social Determinants of Health   Financial Resource Strain: Low Risk  (04/04/2022)   Overall Financial Resource Strain (CARDIA)    Difficulty of Paying Living Expenses: Not hard at all  Food Insecurity: No Food Insecurity (11/03/2022)   Hunger Vital Sign    Worried About Running Out of Food in the Last Year: Never true    Ran Out of Food in the Last Year: Never true  Transportation Needs: No Transportation Needs (11/03/2022)   PRAPARE - Administrator, Civil Service (Medical): No    Lack of Transportation (Non-Medical): No  Physical Activity: Inactive (04/04/2022)   Exercise Vital Sign    Days of Exercise per Week: 0 days    Minutes of Exercise per Session: 0 min  Stress: No Stress Concern Present (04/04/2022)   [employer] of Occupational Health - Occupational Stress Questionnaire    Feeling of Stress : Only a little  Social Connections: Moderately Isolated (04/04/2022)   Social Connection and Isolation Panel [NHANES]    Frequency of Communication with Friends and Family: More than three times a week    Frequency of Social Gatherings with Friends and Family: Three times a week    Attends Religious Services: Never    Active Member of Clubs or Organizations: No    Attends Banker Meetings: Never    Marital Status: Married    Code Status: Full Code  Review of Systems: A 12 point ROS discussed and pertinent positives are indicated in the HPI above.  All other systems are negative.  Review of Systems  Constitutional:  Negative for chills and fever.  Respiratory:  Negative for chest tightness and shortness of breath.   Cardiovascular:   Positive for leg swelling. Negative for chest pain.  Gastrointestinal:  Positive for abdominal distention. Negative for abdominal pain, diarrhea, nausea and vomiting.  Neurological:  Positive for dizziness. Negative for headaches.  Psychiatric/Behavioral:  Negative for confusion.     Vital Signs: T: 97.5, BP: 120/58, HR: 72, Resp: 14, SpO2: 100%   Physical Exam  Vitals reviewed.  Constitutional:      General: She is not in acute distress.    Appearance: She is obese. She is ill-appearing.  HENT:     Mouth/Throat:     Mouth: Mucous membranes are moist.  Cardiovascular:     Rate and Rhythm: Normal rate and regular rhythm.     Pulses: Normal pulses.     Heart sounds: Normal heart sounds.  Pulmonary:     Effort: Pulmonary effort is normal.     Breath sounds: Normal breath sounds.  Abdominal:     General: Bowel sounds are normal.     Palpations: Abdomen is soft.     Tenderness: There is no abdominal tenderness.  Musculoskeletal:     Right lower leg: Edema present.     Left lower leg: Edema present.  Skin:    General: Skin is warm and dry.  Neurological:     Mental Status: She is alert and oriented to person, place, and time.  Psychiatric:        Mood and Affect: Mood normal.        Behavior: Behavior normal.     Imaging: US Paracentesis  Result Date: 11/18/2022 INDICATION: Refractory ascites in patient with history of NASH cirrhosis status post TIPS procedure May 2023 and TIPS revision July 2023 at [facility]. She recently underwent second tips revision 08/20/2022. Request received for therapeutic paracentesis with 8 L max. EXAM: ULTRASOUND GUIDED right lower quadrant therapeutic PARACENTESIS MEDICATIONS: 15 cc 1% lidocaine COMPLICATIONS: None immediate. PROCEDURE: Informed written consent was obtained from the patient after a discussion of the risks, benefits and alternatives to treatment. A timeout was performed prior to the initiation of the procedure. Initial ultrasound scanning  demonstrates a large amount of ascites within the right lower abdominal quadrant. The right lower abdomen was prepped and draped in the usual sterile fashion. 1% lidocaine was used for local anesthesia. Following this, a 19 gauge, 10-cm, Yueh catheter was introduced. An ultrasound image was saved for documentation purposes. The paracentesis was performed. The catheter was removed and a dressing was applied. The patient tolerated the procedure well without immediate post procedural complication. Patient received post-procedure intravenous albumin; see nursing notes for details. FINDINGS: A total of approximately 6.45 L of amber fluid was removed. Ordering provider did not request laboratory samples. IMPRESSION: Successful ultrasound-guided paracentesis yielding 6.45 liters of peritoneal fluid. PLAN: Patient is status post TIPS and TIPS revision at [facility]. Currently still followed by [facility]. Read by: Mina Marble, PA-C Electronically Signed   By: Gilmer Mor D.O.   On: 11/18/2022 12:59   DG Chest Port 1 View  Result Date: 11/17/2022 CLINICAL DATA:  Sepsis EXAM: PORTABLE CHEST 1 VIEW COMPARISON:  09/17/2022 FINDINGS: Lungs are clear. No pneumothorax or pleural effusion. Cardiac size within normal limits. Previously noted right internal jugular chest port has been removed. Pulmonary vascularity is normal. Right total shoulder arthroplasty has been performed. No acute bone abnormality. IMPRESSION: 1. No active disease. Electronically Signed   By: Helyn Numbers M.D.   On: 11/17/2022 01:21   US Paracentesis  Result Date: 11/11/2022 INDICATION: Refractory ascites in patient with history of NASH cirrhosis status post TIPS procedure May 2023 and TIPS revision July 2023 at [facility]. She recently underwent second TIPS revision 08/20/2022. Request received for therapeutic paracentesis with 8 L max. EXAM: ULTRASOUND GUIDED PARACENTESIS MEDICATIONS: 1% lidocaine 20 mL COMPLICATIONS: None immediate. PROCEDURE: Informed written  consent was obtained from the patient after a discussion of the risks, benefits  and alternatives to treatment. A timeout was performed prior to the initiation of the procedure. Initial ultrasound scanning demonstrates a large amount of ascites within the right lower abdominal quadrant. The right lower abdomen was prepped and draped in the usual sterile fashion. 1% lidocaine was used for local anesthesia. Following this, a 19 gauge, 7-cm, Yueh catheter was introduced. An ultrasound image was saved for documentation purposes. The paracentesis was performed. The catheter was removed and a dressing was applied. The patient tolerated the procedure well without immediate post procedural complication. FINDINGS: A total of approximately 3.6 L of clear yellow fluid was removed. IMPRESSION: Successful ultrasound-guided paracentesis yielding 3.6 liters of peritoneal fluid. Read by: Alwyn Ren, NP PLAN: Patient is s/p TIPS and TIPS revision at [facility]. Currently still followed by [facility] Electronically Signed   By: Olive Bass M.D.   On: 11/11/2022 13:00   IR EMBO ART  VEN HEMORR LYMPH EXTRAV  INC GUIDE ROADMAPPING  Result Date: 11/05/2022 INDICATION: Complicated 67 year old female with a history of NASH cirrhosis and portal hypertension status post tips creation with recurrent hemorrhagic internal hemorrhoids. She has undergone prior superior rectal artery embolization on 02/07/2022 with repeat embolization on 09/05/2022 which has worked well for several months. Unfortunately, she is readmitted with significant rectal bleeding. She presents today as an inpatient for repeat mesenteric angiography and possible additional embolization of recanalized superior rectal artery branches. EXAM: IR EMBO ART  VEN HEMORR LYMPH EXTRAV  INC GUIDE ROADMAPPING 1. Ultrasound access right common femoral artery 2. Catheterization of the internal mesenteric artery with arteriogram 3. Catheterization of the superior rectal artery with  arteriogram 4. Catheterization of recanalized branch of the superior rectal artery with arteriogram 5. Coil embolization MEDICATIONS: Ancef 2 g. The antibiotic was administered within 1 hour of the procedure ANESTHESIA/SEDATION: Moderate (conscious) sedation was employed during this procedure. A total of Versed 2 mg and Fentanyl 100 mcg was administered intravenously. Moderate Sedation Time: 46 minutes. The patient's level of consciousness and vital signs were monitored continuously by radiology nursing throughout the procedure under my direct supervision. CONTRAST:  80mL VISIPAQUE IODIXANOL 320 MG/ML IV SOLN FLUOROSCOPY: Radiation Exposure Index (as provided by the fluoroscopic device): 2,056 mGy Kerma COMPLICATIONS: None immediate. PROCEDURE: Informed consent was obtained from the patient following explanation of the procedure, risks, benefits and alternatives. The patient understands, agrees and consents for the procedure. All questions were addressed. A time out was performed prior to the initiation of the procedure. Maximal barrier sterile technique utilized including caps, mask, sterile gowns, sterile gloves, large sterile drape, hand hygiene, and Betadine prep. The right common femoral artery was interrogated with ultrasound and found to be widely patent. An image was obtained and stored for the medical record. Local anesthesia was attained by infiltration with 1% lidocaine. A small dermatotomy was made. Under real-time sonographic guidance, the vessel was punctured with a 21 gauge micropuncture needle. Using standard technique, the initial micro needle was exchanged over a 0.018 micro wire for a transitional 4 [place] micro sheath. The micro sheath was then exchanged over a 0.035 wire for a 5 French vascular sheath. A RIM catheter was advanced over a Bentson wire to the abdominal aorta. The inferior mesenteric artery was selected. Angiography was performed. There appears to be recanalization of branches in  the distal superior rectal artery. A renegade STC microcatheter was advanced over a Fathom 16 wire and into the distal superior rectal artery. Gentle particle embolization was then performed using 300-500 micron embospheres. The microcatheter was then  advanced further into the recanalized superior rectal artery branch. Coil embolization was performed using a series of low-profile Ruby microcoils. Follow-up angiography was performed. All remaining branches appear to supply the sigmoid colon and not the rectum. No further filling of superior rectal branches. The catheter system was removed. Hemostasis was attained with the assistance of a Celt arterial closure device. IMPRESSION: 1. Angiography is positive for recanalized branches of the distal superior rectal artery. 2. Successful embolization of recanalized superior rectal artery branches. Electronically Signed   By: Malachy Moan M.D.   On: 11/05/2022 17:17   US Paracentesis  Result Date: 10/28/2022 INDICATION: Refractory ascites in patient with history of NASH cirrhosis status post TIPS procedure May 2023 and TIPS revision July 2023 at [facility]. She recently underwent second TIPS revision 08/20/2022. Request received for therapeutic paracentesis with 8 L max. EXAM: ULTRASOUND GUIDED THERAPEUTIC RIGHT LOWER QUADRANT PARACENTESIS MEDICATIONS: 10 mL 1 % lidocaine COMPLICATIONS: None immediate. PROCEDURE: Informed written consent was obtained from the patient after a discussion of the risks, benefits and alternatives to treatment. A timeout was performed prior to the initiation of the procedure. Initial ultrasound scanning demonstrates a moderate amount of ascites within the right lower abdominal quadrant. The right lower abdomen was prepped and draped in the usual sterile fashion. 1% lidocaine was used for local anesthesia. Following this, a 19 gauge, 10-cm, Yueh catheter was introduced. An ultrasound image was saved for documentation purposes. The paracentesis  was performed. The catheter was removed and a dressing was applied. The patient tolerated the procedure well without immediate post procedural complication. Patient received post-procedure intravenous albumin; see nursing notes for details. FINDINGS: A total of approximately 5.35 L of clear, yellow fluid was removed. IMPRESSION: Successful ultrasound-guided paracentesis yielding 5.35 L liters of peritoneal fluid. Read by: Alex Gardener, AGNP-BC Electronically Signed   By: Marliss Coots M.D.   On: 10/28/2022 12:43    Labs:  CBC: Recent Labs    11/06/22 0709 11/12/22 1051 11/17/22 0114 11/19/22 1113  WBC 2.6* 3.3* 2.4* 2.7*  HGB 8.0* 7.7* 6.8* 7.2*  HCT 25.0* 24.4* 22.1* 22.9*  PLT 62* 106* 109* 113*    COAGS: Recent Labs    09/17/22 2111 09/18/22 1253 09/19/22 0359 11/03/22 0827 11/17/22 0114  INR 1.4* 1.4*  --  1.4* 1.1  APTT 34 35 36  --  33    BMP: Recent Labs    11/03/22 1833 11/04/22 0432 11/06/22 0709 11/17/22 0114  NA 138 139 140 133*  K 4.1 3.8 3.2* 4.0  CL 106 107 108 104  CO2 21* 22 22 22   GLUCOSE 120* 140* 175* 135*  BUN 52* 52* 47* 45*  CALCIUM 8.4* 8.2* 7.8* 7.9*  CREATININE 2.43* 2.27* 2.12* 1.94*  GFRNONAA 22* 24* 26* 28*    LIVER FUNCTION TESTS: Recent Labs    11/03/22 0827 11/03/22 1833 11/04/22 0432 11/17/22 0114  BILITOT 2.3* 2.2* 2.6* 1.5*  AST 41 38 35 52*  ALT 13 15 13 21   ALKPHOS 80 73 65 122  PROT 4.4* 4.2* 4.0* 5.0*  ALBUMIN 2.4* 2.2* 2.1* 2.5*    TUMOR MARKERS: No results for input(s): "AFPTM", "CEA", "CA199", "CHROMGRNA" in the last 8760 hours.  Assessment and Plan:  Rachel Huber is a 67 yo female with chronic anemia requiring transfusions being seen today for image-guided port placement and image-guided paracentesis. She presents today in her usual state of health. She is NPO. Case has been reviewed with Dr Miles Costain and is set to proceed on  11/25/22.  Risks and benefits of image guided port-a-catheter placement was discussed  with the patient including, but not limited to bleeding, infection, pneumothorax, or fibrin sheath development and need for additional procedures.  Risks and benefits of image-guided paracentesis were discussed with the patient including, but not limited to, bleeding, infection, and damage to abdominal organs and/or other surrounding anatomy.  All of the patient's questions were answered, patient is agreeable to proceed. Consent signed and in chart.   Thank you for this interesting consult.  I greatly enjoyed meeting Rachel Huber and look forward to participating in their care.  A copy of this report was sent to the requesting provider on this date.  Electronically Signed: Kennieth Francois, PA 11/25/2022, 10:10 AM   I spent a total of 25 Minutes in face to face in clinical consultation, greater than 50% of which was counseling/coordinating care for image-guided port placement and paracentesis.

## 2022-11-25 NOTE — Progress Notes (Signed)
Patient clinically stable post Paracentesis/Port placement per Dr Miles Costain, tolerated well with vitals stable pre and post procedure. 4400 ml ascites fluid removed, received Versed 2 mg along with Fentanyl 100 mcg  IV , awake/alert and oriented post procedure.

## 2022-11-26 ENCOUNTER — Inpatient Hospital Stay: Payer: Medicare Other

## 2022-11-26 DIAGNOSIS — D696 Thrombocytopenia, unspecified: Secondary | ICD-10-CM | POA: Diagnosis not present

## 2022-11-26 DIAGNOSIS — D500 Iron deficiency anemia secondary to blood loss (chronic): Secondary | ICD-10-CM

## 2022-11-26 DIAGNOSIS — I129 Hypertensive chronic kidney disease with stage 1 through stage 4 chronic kidney disease, or unspecified chronic kidney disease: Secondary | ICD-10-CM | POA: Diagnosis not present

## 2022-11-26 DIAGNOSIS — E559 Vitamin D deficiency, unspecified: Secondary | ICD-10-CM | POA: Diagnosis not present

## 2022-11-26 DIAGNOSIS — N189 Chronic kidney disease, unspecified: Secondary | ICD-10-CM | POA: Diagnosis not present

## 2022-11-26 DIAGNOSIS — E538 Deficiency of other specified B group vitamins: Secondary | ICD-10-CM | POA: Diagnosis not present

## 2022-11-26 DIAGNOSIS — K7460 Unspecified cirrhosis of liver: Secondary | ICD-10-CM | POA: Diagnosis not present

## 2022-11-26 DIAGNOSIS — D561 Beta thalassemia: Secondary | ICD-10-CM | POA: Diagnosis not present

## 2022-11-26 DIAGNOSIS — D631 Anemia in chronic kidney disease: Secondary | ICD-10-CM | POA: Diagnosis not present

## 2022-11-26 LAB — PREPARE RBC (CROSSMATCH)

## 2022-11-26 LAB — CBC
HCT: 21.2 % — ABNORMAL LOW (ref 36.0–46.0)
Hemoglobin: 6.5 g/dL — CL (ref 12.0–15.0)
MCH: 25.3 pg — ABNORMAL LOW (ref 26.0–34.0)
MCHC: 30.7 g/dL (ref 30.0–36.0)
MCV: 82.5 fL (ref 80.0–100.0)
Platelets: 116 10*3/uL — ABNORMAL LOW (ref 150–400)
RBC: 2.57 MIL/uL — ABNORMAL LOW (ref 3.87–5.11)
RDW: 20.2 % — ABNORMAL HIGH (ref 11.5–15.5)
WBC: 2.5 10*3/uL — ABNORMAL LOW (ref 4.0–10.5)
nRBC: 0 % (ref 0.0–0.2)

## 2022-11-26 LAB — TYPE AND SCREEN: Unit division: 0

## 2022-11-26 LAB — BPAM RBC: Blood Product Expiration Date: 202405122359

## 2022-11-26 NOTE — Progress Notes (Signed)
CRITICAL VALUE ALERT Critical value received:  hgb 6.5 Date of notification:  11-26-22 Time of notification: 1105 Critical value read back:  Yes.   Nurse who received alert:  C. PageRN MD notified time and response:  1106, Reb. Pennington PA-C

## 2022-11-26 NOTE — Progress Notes (Signed)
Will order one unit of blood for transfusion tomorrow per Reb. Pennington PA-C.

## 2022-11-26 NOTE — Addendum Note (Signed)
Addended by: Harrel Lemon on: 11/26/2022 11:12 AM   Modules accepted: Orders

## 2022-11-26 NOTE — Progress Notes (Signed)
Patient presents for port flush / lab and requested labs be drawn peripheral due to her port was placed yesterday and patient states it's very sore. Labs drawn peripherally.

## 2022-11-27 ENCOUNTER — Inpatient Hospital Stay: Payer: Medicare Other

## 2022-11-27 DIAGNOSIS — D696 Thrombocytopenia, unspecified: Secondary | ICD-10-CM | POA: Diagnosis not present

## 2022-11-27 DIAGNOSIS — I129 Hypertensive chronic kidney disease with stage 1 through stage 4 chronic kidney disease, or unspecified chronic kidney disease: Secondary | ICD-10-CM | POA: Diagnosis not present

## 2022-11-27 DIAGNOSIS — D500 Iron deficiency anemia secondary to blood loss (chronic): Secondary | ICD-10-CM | POA: Diagnosis not present

## 2022-11-27 DIAGNOSIS — E559 Vitamin D deficiency, unspecified: Secondary | ICD-10-CM | POA: Diagnosis not present

## 2022-11-27 DIAGNOSIS — D561 Beta thalassemia: Secondary | ICD-10-CM | POA: Diagnosis not present

## 2022-11-27 DIAGNOSIS — K7460 Unspecified cirrhosis of liver: Secondary | ICD-10-CM | POA: Diagnosis not present

## 2022-11-27 DIAGNOSIS — E538 Deficiency of other specified B group vitamins: Secondary | ICD-10-CM | POA: Diagnosis not present

## 2022-11-27 DIAGNOSIS — N189 Chronic kidney disease, unspecified: Secondary | ICD-10-CM | POA: Diagnosis not present

## 2022-11-27 DIAGNOSIS — D631 Anemia in chronic kidney disease: Secondary | ICD-10-CM | POA: Diagnosis not present

## 2022-11-27 LAB — BPAM RBC: ISSUE DATE / TIME: 202404111019

## 2022-11-27 LAB — TYPE AND SCREEN: Antibody Screen: NEGATIVE

## 2022-11-27 MED ORDER — SODIUM CHLORIDE 0.9% IV SOLUTION
250.0000 mL | Freq: Once | INTRAVENOUS | Status: AC
  Administered 2022-11-27: 250 mL via INTRAVENOUS

## 2022-11-27 MED ORDER — SODIUM CHLORIDE 0.9% FLUSH
10.0000 mL | INTRAVENOUS | Status: AC | PRN
  Administered 2022-11-27: 10 mL

## 2022-11-27 MED ORDER — HEPARIN SOD (PORK) LOCK FLUSH 100 UNIT/ML IV SOLN
500.0000 [IU] | Freq: Every day | INTRAVENOUS | Status: AC | PRN
  Administered 2022-11-27: 500 [IU]

## 2022-11-27 NOTE — Progress Notes (Signed)
One unit of blood given per orders. Patient tolerated it well without problems. Vitals stable and discharged home from clinic via wheelchair. Follow up as scheduled.  

## 2022-11-27 NOTE — Patient Instructions (Signed)
[facility]  Discharge Instructions: Thank you for choosing [facility] to provide your oncology and hematology care.  If you have a lab appointment with the Cancer Center - please note that after April 8th, 2024, all labs will be drawn in the cancer center.  You do not have to check in or register with the main entrance as you have in the past but will complete your check-in in the cancer center.  Wear comfortable clothing and clothing appropriate for easy access to any Portacath or PICC line.   We strive to give you quality time with your provider. You may need to reschedule your appointment if you arrive late (15 or more minutes).  Arriving late affects you and other patients whose appointments are after yours.  Also, if you miss three or more appointments without notifying the office, you may be dismissed from the clinic at the provider's discretion.      For prescription refill requests, have your pharmacy contact our office and allow 72 hours for refills to be completed.    Today you received one unit of blood   To help prevent nausea and vomiting after your treatment, we encourage you to take your nausea medication as directed.  BELOW ARE SYMPTOMS THAT SHOULD BE REPORTED IMMEDIATELY: *FEVER GREATER THAN 100.4 F (38 C) OR HIGHER *CHILLS OR SWEATING *NAUSEA AND VOMITING THAT IS NOT CONTROLLED WITH YOUR NAUSEA MEDICATION *UNUSUAL SHORTNESS OF BREATH *UNUSUAL BRUISING OR BLEEDING *URINARY PROBLEMS (pain or burning when urinating, or frequent urination) *BOWEL PROBLEMS (unusual diarrhea, constipation, pain near the anus) TENDERNESS IN MOUTH AND THROAT WITH OR WITHOUT PRESENCE OF ULCERS (sore throat, sores in mouth, or a toothache) UNUSUAL RASH, SWELLING OR PAIN  UNUSUAL VAGINAL DISCHARGE OR ITCHING   Items with * indicate a potential emergency and should be followed up as soon as possible or go to the Emergency Department if any problems should  occur.  Please show the CHEMOTHERAPY ALERT CARD or IMMUNOTHERAPY ALERT CARD at check-in to the Emergency Department and triage nurse.  Should you have questions after your visit or need to cancel or reschedule your appointment, please contact [facility] [phone]  and follow the prompts.  Office hours are 8:00 a.m. to 4:30 p.m. Monday - Friday. Please note that voicemails left after 4:00 p.m. may not be returned until the following business day.  We are closed weekends and major holidays. You have access to a nurse at all times for urgent questions. Please call the main number to the clinic [phone] and follow the prompts.  For any non-urgent questions, you may also contact your provider using MyChart. We now offer e-Visits for anyone 18 and older to request care online for non-urgent symptoms. For details visit [email or web address].   Also download the MyChart app! Go to the app store, search "MyChart", open the app, select , and log in with your MyChart username and password.   

## 2022-11-28 ENCOUNTER — Other Ambulatory Visit: Payer: Self-pay | Admitting: Gastroenterology

## 2022-11-28 ENCOUNTER — Ambulatory Visit (HOSPITAL_COMMUNITY): Payer: Medicare Other

## 2022-11-28 DIAGNOSIS — M6281 Muscle weakness (generalized): Secondary | ICD-10-CM

## 2022-11-28 DIAGNOSIS — K7460 Unspecified cirrhosis of liver: Secondary | ICD-10-CM

## 2022-11-28 DIAGNOSIS — R2689 Other abnormalities of gait and mobility: Secondary | ICD-10-CM | POA: Diagnosis not present

## 2022-11-28 DIAGNOSIS — R262 Difficulty in walking, not elsewhere classified: Secondary | ICD-10-CM | POA: Diagnosis not present

## 2022-11-28 LAB — TYPE AND SCREEN: ABO/RH(D): A POS

## 2022-11-28 LAB — BPAM RBC: Blood Product Expiration Date: 202405122359

## 2022-11-28 NOTE — Therapy (Addendum)
OUTPATIENT PHYSICAL THERAPY LOWER EXTREMITY TREATMENT   Patient Name: Rachel Huber MRN: [id number] DOB:November 01, 1955, 67 y.o., female Today's Date: 11/28/2022  END OF SESSION:  PT End of Session - 11/28/22 1330     Visit Number 2    Number of Visits 16    Date for PT Re-Evaluation 01/19/23    Authorization Type Medicare A/ AETNA CVA 2ndary    Progress Note Due on Visit 10    PT Start Time 1305    PT Stop Time 1345    PT Time Calculation (min) 40 min    Equipment Utilized During Treatment Gait belt    Activity Tolerance Patient tolerated treatment well    Behavior During Therapy WFL for tasks assessed/performed            Past Medical History:  Diagnosis Date   Anemia in chronic kidney disease (CKD) 02/07/2022   Anxiety    Arthritis    Cirrhosis of liver    Diabetes mellitus without complication    Dyspnea    DOE   GERD (gastroesophageal reflux disease)    Grade IV internal hemorrhoids    Contingency plan for any future admissions for severe anemia in the setting of persistent GI bleed:  nuclear medicine tagged RBC scan to assess for location of GI bleeding - if from rectum, would then proceed to angiogram with possible repeat embolization.  Please notify IR in this case.   Hepatitis    PAST   Hypertension    Neuropathy    Neuropathy, diabetic    Pneumonia    Sinus complaint    Sleep apnea    CPAP   Thalassemia minor 1992   Past Surgical History:  Procedure Laterality Date   ABDOMINAL HYSTERECTOMY     BREAST BIOPSY Right 02/15/2018   [place] bx 6-6:30 ribbon shape, ONE CORE FRAGMENT WITH FIBROSIS. ONE CORE FRAGMENT WITH PORTION OF A DILATED   BREAST BIOPSY Right 02/15/2018   [place] bx 9:00 heart shape, USUAL DUCTAL HYPERPLASIA   BREAST LUMPECTOMY Right 03/09/2018   Procedure: BREAST LUMPECTOMY x 2;  Surgeon: Sung Amabile, DO;  Location: [facility] OR[facility];  Service: General;  Laterality: Right;   CATARACT EXTRACTION W/PHACO Left 06/11/2016   Procedure: CATARACT EXTRACTION PHACO  AND INTRAOCULAR LENS PLACEMENT (IOC);  Surgeon: Sallee Lange, MD;  Location: [facility];  Service: Ophthalmology;  Laterality: Left;  Lot # [id number] H US:01:38.6 AP%:26.4 CDE:44.15   CATARACT EXTRACTION W/PHACO Right 06/15/2018   Procedure: CATARACT EXTRACTION PHACO AND INTRAOCULAR LENS PLACEMENT (IOC);  Surgeon: Galen Manila, MD;  Location: [facility];  Service: Ophthalmology;  Laterality: Right;  [place] 00:38.2 CDE 4.23 Fluid Pack Lot # [id number]   COLONOSCOPIES     COLONOSCOPY WITH PROPOFOL N/A 10/10/2020   Procedure: COLONOSCOPY WITH PROPOFOL;  Surgeon: Regis Bill, MD;  Location: [facility] END[facility]COPY;  Service: Endoscopy;  Laterality: N/A;   COLONOSCOPY WITH PROPOFOL N/A 11/20/2020   Procedure: COLONOSCOPY WITH PROPOFOL;  Surgeon: Regis Bill, MD;  Location: [facility] ENDOSCOPY;  Service: Endoscopy;  Laterality: N/A;  DM STAT CBC, BMP COVID POSITIVE 09/02/2020   COLONOSCOPY WITH PROPOFOL N/A 06/19/2022   Procedure: COLONOSCOPY WITH PROPOFOL;  Surgeon: Regis Bill, MD;  Location: [facility] ENDOSCOPY;  Service: Endoscopy;  Laterality: N/A;   CTR     ESOPHAGOGASTRODUODENOSCOPY (EGD) WITH PROPOFOL N/A 10/10/2020   Procedure: ESOPHAGOGASTRODUODENOSCOPY (EGD) WITH PROPOFOL;  Surgeon: Regis Bill, MD;  Location: [facility] ENDOSCOPY;  Service: Endoscopy;  Laterality: N/A;  COVID POSITIVE 10/08/2020   FLEXIBLE SIGMOIDOSCOPY  N/A 02/06/2022   Procedure: FLEXIBLE SIGMOIDOSCOPY;  Surgeon: Regis Bill, MD;  Location: [facility] ENDOSCOPY;  Service: Endoscopy;  Laterality: N/A;  Patient requests anesthesia   IR ANGIOGRAM SELECTIVE EACH ADDITIONAL VESSEL  02/07/2022   IR ANGIOGRAM SELECTIVE EACH ADDITIONAL VESSEL  02/07/2022   IR ANGIOGRAM SELECTIVE EACH ADDITIONAL VESSEL  09/05/2022   IR ANGIOGRAM VISCERAL SELECTIVE  02/07/2022   IR ANGIOGRAM VISCERAL SELECTIVE  09/05/2022   IR EMBO ART  VEN HEMORR LYMPH EXTRAV  INC GUIDE ROADMAPPING  09/05/2022   IR EMBO ART  VEN HEMORR LYMPH EXTRAV  INC GUIDE  ROADMAPPING  11/05/2022   IR EMBO ARTERIAL NOT HEMORR HEMANG INC GUIDE ROADMAPPING  02/07/2022   IR IMAGING GUIDED PORT INSERTION  06/13/2022   IR IMAGING GUIDED PORT INSERTION  11/25/2022   IR PARACENTESIS  12/17/2021   IR PARACENTESIS  03/25/2022   IR PARACENTESIS  11/25/2022   IR RADIOLOGIST EVAL & MGMT  03/18/2022   IR RADIOLOGIST EVAL & MGMT  05/07/2022   IR RADIOLOGIST EVAL & MGMT  05/16/2022   IR RADIOLOGIST EVAL & MGMT  10/13/2022   IR US GUIDE VASC ACCESS RIGHT  02/07/2022   IR US GUIDE VASC ACCESS RIGHT  09/05/2022   JOINT REPLACEMENT     KNEE SURGERY Right 09/24/2017   plates and pins   PORT-A-CATH REMOVAL N/A 09/23/2022   Procedure: MINOR REMOVAL PORT-A-CATH;  Surgeon: Franky Macho, MD;  Location: [facility];  Service: General;  Laterality: N/A;   TEE WITHOUT CARDIOVERSION N/A 09/23/2022   Procedure: TRANSESOPHAGEAL ECHOCARDIOGRAM (TEE);  Surgeon: Antoine Poche, MD;  Location: [facility];  Service: Endoscopy;  Laterality: N/A;   TOTAL SHOULDER ARTHROPLASTY Right 09/27/2015   Patient Active Problem List   Diagnosis Date Noted   Hypokalemia 11/06/2022   BRBPR (bright red blood per rectum) 11/03/2022   Acute on chronic anemia 11/03/2022   Hyperkalemia 11/03/2022   Hypotension 11/03/2022   Acute renal failure (ARF) 10/16/2022   AKI (acute kidney injury) 10/15/2022   Bacteremia 09/21/2022   SIRS (systemic inflammatory response syndrome) 09/18/2022   Sepsis 09/18/2022   Rectal bleeding 06/17/2022   Obesity (BMI 30-39.9) 06/17/2022   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 mellitus 06/17/2022   Grade IV internal hemorrhoids 05/16/2022   Acute on chronic blood loss anemia 03/05/2022   Thrombocytopenia 03/05/2022   Fever 02/08/2022   Anemia in chronic kidney disease (CKD) 02/07/2022   GI bleeding 02/04/2022   Chronic kidney disease, stage 3b 02/04/2022   Depression with anxiety 02/04/2022   Liver cirrhosis secondary to NASH    Insomnia 01/27/2022   Iron deficiency anemia due to chronic blood  loss 02/13/2021   History of uterine cancer 02/13/2021   Decompensated hepatic cirrhosis 10/25/2020   OSA on CPAP 10/25/2020   Glossitis 10/25/2020   Hyponatremia 10/08/2020   Acute respiratory failure with hypoxia 09/10/2020   Pneumonia due to COVID-19 virus 09/08/2020   Hip fracture, right 09/04/2020   Closed right hip fracture 09/02/2020   Type 2 diabetes mellitus with hyperlipidemia 09/02/2020   Thalassemia minor    HLD (hyperlipidemia)    Acute kidney injury superimposed on CKD    Depression    Nondisplaced fracture of greater trochanter of right femur, initial encounter for closed fracture     PCP: Einar Crow MD  REFERRING PROVIDER: Kristine Linea, NP  REFERRING DIAG: R26.9 (ICD-10-CM) - Unspecified abnormalities of gait and mobility  THERAPY DIAG:  Muscle weakness (generalized)  Other abnormalities of  gait and mobility  Difficulty in walking, not elsewhere classified  Rationale for Evaluation and Treatment: Rehabilitation  ONSET DATE: Chronic  SUBJECTIVE:   SUBJECTIVE STATEMENT: Arrives to the clinic with a rolling walker. Patient is hurting on the R shoulder = 5/10. Patient reports that her legs used to be significantly swollen but now has subsided. Patient still reports of LE weakness. Patient states that she may have slept wrong. Denies any falls.   EVAL: Patient presents to therapy with complaint of LE weakness and gait difficulty. She is previously known to this clinic. She says she is doing better than before but she was in the hospital for the better part of February due to altered labs and subsequent infection. She is now slated to have kidney and liver transplants coming up but does not yet have a set date.   PERTINENT HISTORY: Falls, weakness, DM, R shoulder THA Feb 2019 R knee pain (OA)  Cirrhosis   PAIN:  Are you having pain? no  PRECAUTIONS: Fall  WEIGHT BEARING RESTRICTIONS: No  FALLS:  Has patient fallen in last 6 months?  No  LIVING ENVIRONMENT: Lives with: lives with their family and lives with their spouse Lives in: House/apartment Stairs: No Has following equipment at home: Single point cane and Environmental consultant - 2 wheeled  OCCUPATION: Retired/ Disability     PLOF: Needs assistance with ADLs  PATIENT GOALS: To get stronger for surgery   NEXT MD VISIT: 01/21/23  OBJECTIVE:   DIAGNOSTIC FINDINGS: NA  COGNITION: Overall cognitive status: Within functional limits for tasks assessed     SENSATION: Not tested  LOWER EXTREMITY MMT: (unable to lay prone)   MMT Right eval Left eval  Hip flexion 4 4  Hip extension    Hip abduction 4- 4  Hip adduction    Hip internal rotation    Hip external rotation    Knee flexion    Knee extension 4 4  Ankle dorsiflexion 4+ 4  Ankle plantarflexion    Ankle inversion    Ankle eversion     (Blank rows = not tested)   FUNCTIONAL TESTS:  5 times sit to stand: Test next session  2 minute walk test: 60 feet in 1 minute using cane   GAIT:  Decreased stride, trunk flexed, slow speed, using RW  TODAY'S TREATMENT:                                                                                                                              DATE:  11/28/2022 Gastrocnemius slant board stretch x 30" x 3 Sit-to-stand training, no UE, slightly elevated seat (emphasis on scooting, heel placement backwards, and trunk momentum) x 10  Heel/toe raises x 20 reps Tandem stance, firm surface, eyes open x 30" x 2 Mini squats x 3" x 10 x 2, little to no UE support Hip vectors, RTB x 5 x 4 on each  11/24/22 Eval    PATIENT EDUCATION:  Education details: on Eval findings, POC and HEP Person educated: Patient Education method: Explanation Education comprehension: verbalized understanding  HOME EXERCISE PROGRAM: Access Code: [email or web address] URL: [email or web address] Date: 11/24/2022 Prepared by: Georges Lynch  Exercises - Seated Heel Toe Raises  - 2 x daily -  7 x weekly - 1 sets - 10 reps - Seated Long Arc Quad  - 2 x daily - 7 x weekly - 1 sets - 10 reps - Seated March  - 2 x daily - 7 x weekly - 1 sets - 10 reps - Supine Active Straight Leg Raise  - 7 x weekly - 1 sets - 10 reps - Heel Raises with Counter Support  - 2 x daily - 7 x weekly - 1 sets - 10 reps - 3-5" hold - Mini Squat with Counter Support  - 2 x daily - 7 x weekly - 1 sets - 10 reps - 3-5" hold - Sit to Stand with Counter Support  - 2 x daily - 7 x weekly - 1 sets - 10 reps  ASSESSMENT:  CLINICAL IMPRESSION: Interventions today were geared towards LE flexibility, balance, and functional strengthening.  Demonstrated mild to moderate levels of fatigue. Pacing of activities was slow. Rest periods provided. Slight unsteadiness seen on tandem stance due to impaired proprioception. Provided slight amount of cueing to ensure correct execution of activity with good carry-over. To date, skilled PT is required to address the impairments and improve function.   EVAL: Patient is a 67 y.o. female who presents to physical therapy with complaint of LE weakness and altered gait. Patient demonstrates decreased strength, balance deficits and gait abnormalities which are negatively impacting patient ability to perform ADLs and functional mobility tasks. Patient will benefit from skilled physical therapy services to address these deficits to improve level of function with ADLs, functional mobility tasks, and reduce risk for falls.    OBJECTIVE IMPAIRMENTS: Abnormal gait, decreased activity tolerance, decreased balance, decreased endurance, decreased mobility, difficulty walking, decreased strength, dizziness, hypomobility, and improper body mechanics.   ACTIVITY LIMITATIONS: carrying, lifting, bending, sitting, standing, squatting, stairs, transfers, bed mobility, and locomotion level  PARTICIPATION LIMITATIONS: meal prep, cleaning, laundry, driving, shopping, community activity, and yard work  PERSONAL  FACTORS: Past/current experiences, Time since onset of injury/illness/exacerbation, and 3+ comorbidities: See history  are also affecting patient's functional outcome.   REHAB POTENTIAL: Fair See above  CLINICAL DECISION MAKING: Stable/uncomplicated  EVALUATION COMPLEXITY: Low   GOALS: SHORT TERM GOALS: Target date: 12/22/2022  Patient will be independent with initial HEP and self-management strategies to improve functional outcomes Baseline:  Goal status: INITIAL   2.  Patient will be able to perform stand x 5 in < 20 seconds to demonstrate improvement in functional mobility and reduced risk for falls.  Baseline:  Goal status: INITIAL  LONG TERM GOALS: Target date: 01/19/2023  Patient will be independent with advanced HEP and self-management strategies to improve functional outcomes Baseline:  Goal status: INITIAL  2.  Patient will be able to ambulate at least 250 feet during with LRAD to demonstrate improved ability to perform functional mobility and associated tasks. Baseline:60 feet in 1 minute using cane  Goal status: INITIAL  3.  Patient will be able to perform stand x 5 in < 15 seconds to demonstrate improvement in functional mobility and reduced risk for falls.  Baseline:  Goal status: INITIAL  4. Patient will have equal to or > 4+/5 MMT throughout tested BLE to improve ability to perform functional mobility,  stair ambulation and ADLs.  Baseline: See MMT Goal status: INITIAL  PLAN:  PT FREQUENCY: 2x/week  PT DURATION: 8 weeks  PLANNED INTERVENTIONS: Therapeutic exercises, Therapeutic activity, Neuromuscular re-education, Balance training, Gait training, Patient/Family education, Joint manipulation, Joint mobilization, Stair training, Aquatic Therapy, Dry Needling, Electrical stimulation, Spinal manipulation, Spinal mobilization, Cryotherapy, Moist heat, scar mobilization, Taping, Traction, Ultrasound, Biofeedback, Ionotophoresis 4mg /ml Dexamethasone, and Manual  therapy.   PLAN FOR NEXT SESSION: Test 5 x STS. Progress LE and core strength as tolerated. Gait and balance progressions as able.   3:32 PM, 11/28/22 Tish Frederickson. Kishan Wachsmuth, PT, DPT, OCS Board-Certified Clinical Specialist in Orthopedic PT PT Compact Privilege # ([place]): [id number] T

## 2022-12-01 ENCOUNTER — Ambulatory Visit (HOSPITAL_COMMUNITY): Payer: Medicare Other | Admitting: Physical Therapy

## 2022-12-01 DIAGNOSIS — M6281 Muscle weakness (generalized): Secondary | ICD-10-CM | POA: Diagnosis not present

## 2022-12-01 DIAGNOSIS — R2689 Other abnormalities of gait and mobility: Secondary | ICD-10-CM

## 2022-12-01 DIAGNOSIS — R262 Difficulty in walking, not elsewhere classified: Secondary | ICD-10-CM | POA: Diagnosis not present

## 2022-12-01 NOTE — Therapy (Signed)
OUTPATIENT PHYSICAL THERAPY LOWER EXTREMITY TREATMENT   Patient Name: VALORA NORELL MRN: [id number] DOB:10/08/1955, 67 y.o., female Today's Date: 12/01/2022  END OF SESSION:  PT End of Session - 12/01/22 1446     Visit Number 3    Number of Visits 16    Date for PT Re-Evaluation 01/19/23    Authorization Type Medicare A/ AETNA CVA 2ndary    Progress Note Due on Visit 10    PT Start Time 1445    PT Stop Time 1515    PT Time Calculation (min) 30 min    Equipment Utilized During Treatment Gait belt    Activity Tolerance Patient tolerated treatment well    Behavior During Therapy WFL for tasks assessed/performed            Past Medical History:  Diagnosis Date   Anemia in chronic kidney disease (CKD) 02/07/2022   Anxiety    Arthritis    Cirrhosis of liver    Diabetes mellitus without complication    Dyspnea    DOE   GERD (gastroesophageal reflux disease)    Grade IV internal hemorrhoids    Contingency plan for any future admissions for severe anemia in the setting of persistent GI bleed:  nuclear medicine tagged RBC scan to assess for location of GI bleeding - if from rectum, would then proceed to angiogram with possible repeat embolization.  Please notify IR in this case.   Hepatitis    PAST   Hypertension    Neuropathy    Neuropathy, diabetic    Pneumonia    Sinus complaint    Sleep apnea    CPAP   Thalassemia minor 1992   Past Surgical History:  Procedure Laterality Date   ABDOMINAL HYSTERECTOMY     BREAST BIOPSY Right 02/15/2018   [place] bx 6-6:30 ribbon shape, ONE CORE FRAGMENT WITH FIBROSIS. ONE CORE FRAGMENT WITH PORTION OF A DILATED   BREAST BIOPSY Right 02/15/2018   [place] bx 9:00 heart shape, USUAL DUCTAL HYPERPLASIA   BREAST LUMPECTOMY Right 03/09/2018   Procedure: BREAST LUMPECTOMY x 2;  Surgeon: Sung Amabile, DO;  Location: [facility];  Service: General;  Laterality: Right;   CATARACT EXTRACTION W/PHACO Left 06/11/2016   Procedure: CATARACT EXTRACTION PHACO  AND INTRAOCULAR LENS PLACEMENT (IOC);  Surgeon: Sallee Lange, MD;  Location: [facility];  Service: Ophthalmology;  Laterality: Left;  Lot # [id number] H US:01:38.6 AP%:26.4 CDE:44.15   CATARACT EXTRACTION W/PHACO Right 06/15/2018   Procedure: CATARACT EXTRACTION PHACO AND INTRAOCULAR LENS PLACEMENT (IOC);  Surgeon: Galen Manila, MD;  Location: [facility];  Service: Ophthalmology;  Laterality: Right;  [place] 00:38.2 CDE 4.23 Fluid Pack Lot # [id number]   COLONOSCOPIES     COLONOSCOPY WITH PROPOFOL N/A 10/10/2020   Procedure: COLONOSCOPY WITH PROPOFOL;  Surgeon: Regis Bill, MD;  Location: [facility] ENDOSCOPY;  Service: Endoscopy;  Laterality: N/A;   COLONOSCOPY WITH PROPOFOL N/A 11/20/2020   Procedure: COLONOSCOPY WITH PROPOFOL;  Surgeon: Regis Bill, MD;  Location: [facility] ENDOSCOPY;  Service: Endoscopy;  Laterality: N/A;  DM STAT CBC, BMP COVID POSITIVE 09/02/2020   COLONOSCOPY WITH PROPOFOL N/A 06/19/2022   Procedure: COLONOSCOPY WITH PROPOFOL;  Surgeon: Regis Bill, MD;  Location: [facility] ENDOSCOPY;  Service: Endoscopy;  Laterality: N/A;   CTR     ESOPHAGOGASTRODUODENOSCOPY (EGD) WITH PROPOFOL N/A 10/10/2020   Procedure: ESOPHAGOGASTRODUODENOSCOPY (EGD) WITH PROPOFOL;  Surgeon: Regis Bill, MD;  Location: [facility] ENDOSCOPY;  Service: Endoscopy;  Laterality: N/A;  COVID POSITIVE 10/08/2020   FLEXIBLE SIGMOIDOSCOPY  N/A 02/06/2022   Procedure: FLEXIBLE SIGMOIDOSCOPY;  Surgeon: Regis Bill, MD;  Location: [facility] ENDOSCOPY;  Service: Endoscopy;  Laterality: N/A;  Patient requests anesthesia   IR ANGIOGRAM SELECTIVE EACH ADDITIONAL VESSEL  02/07/2022   IR ANGIOGRAM SELECTIVE EACH ADDITIONAL VESSEL  02/07/2022   IR ANGIOGRAM SELECTIVE EACH ADDITIONAL VESSEL  09/05/2022   IR ANGIOGRAM VISCERAL SELECTIVE  02/07/2022   IR ANGIOGRAM VISCERAL SELECTIVE  09/05/2022   IR EMBO ART  VEN HEMORR LYMPH EXTRAV  INC GUIDE ROADMAPPING  09/05/2022   IR EMBO ART  VEN HEMORR LYMPH EXTRAV  INC GUIDE  ROADMAPPING  11/05/2022   IR EMBO ARTERIAL NOT HEMORR HEMANG INC GUIDE ROADMAPPING  02/07/2022   IR IMAGING GUIDED PORT INSERTION  06/13/2022   IR IMAGING GUIDED PORT INSERTION  11/25/2022   IR PARACENTESIS  12/17/2021   IR PARACENTESIS  03/25/2022   IR PARACENTESIS  11/25/2022   IR RADIOLOGIST EVAL & MGMT  03/18/2022   IR RADIOLOGIST EVAL & MGMT  05/07/2022   IR RADIOLOGIST EVAL & MGMT  05/16/2022   IR RADIOLOGIST EVAL & MGMT  10/13/2022   IR US GUIDE VASC ACCESS RIGHT  02/07/2022   IR US GUIDE VASC ACCESS RIGHT  09/05/2022   JOINT REPLACEMENT     KNEE SURGERY Right 09/24/2017   plates and pins   PORT-A-CATH REMOVAL N/A 09/23/2022   Procedure: MINOR REMOVAL PORT-A-CATH;  Surgeon: Franky Macho, MD;  Location: [facility];  Service: General;  Laterality: N/A;   TEE WITHOUT CARDIOVERSION N/A 09/23/2022   Procedure: TRANSESOPHAGEAL ECHOCARDIOGRAM (TEE);  Surgeon: Antoine Poche, MD;  Location: [facility];  Service: Endoscopy;  Laterality: N/A;   TOTAL SHOULDER ARTHROPLASTY Right 09/27/2015   Patient Active Problem List   Diagnosis Date Noted   Hypokalemia 11/06/2022   BRBPR (bright red blood per rectum) 11/03/2022   Acute on chronic anemia 11/03/2022   Hyperkalemia 11/03/2022   Hypotension 11/03/2022   Acute renal failure (ARF) 10/16/2022   AKI (acute kidney injury) 10/15/2022   Bacteremia 09/21/2022   SIRS (systemic inflammatory response syndrome) 09/18/2022   Sepsis 09/18/2022   Rectal bleeding 06/17/2022   Obesity (BMI 30-39.9) 06/17/2022   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 mellitus 06/17/2022   Grade IV internal hemorrhoids 05/16/2022   Acute on chronic blood loss anemia 03/05/2022   Thrombocytopenia 03/05/2022   Fever 02/08/2022   Anemia in chronic kidney disease (CKD) 02/07/2022   GI bleeding 02/04/2022   Chronic kidney disease, stage 3b 02/04/2022   Depression with anxiety 02/04/2022   Liver cirrhosis secondary to NASH    Insomnia 01/27/2022   Iron deficiency anemia due to chronic blood  loss 02/13/2021   History of uterine cancer 02/13/2021   Decompensated hepatic cirrhosis 10/25/2020   OSA on CPAP 10/25/2020   Glossitis 10/25/2020   Hyponatremia 10/08/2020   Acute respiratory failure with hypoxia 09/10/2020   Pneumonia due to COVID-19 virus 09/08/2020   Hip fracture, right 09/04/2020   Closed right hip fracture 09/02/2020   Type 2 diabetes mellitus with hyperlipidemia 09/02/2020   Thalassemia minor    HLD (hyperlipidemia)    Acute kidney injury superimposed on CKD    Depression    Nondisplaced fracture of greater trochanter of right femur, initial encounter for closed fracture     PCP: Einar Crow MD  REFERRING PROVIDER: Kristine Linea, NP  REFERRING DIAG: R26.9 (ICD-10-CM) - Unspecified abnormalities of gait and mobility  THERAPY DIAG:  Muscle weakness (generalized)  Other abnormalities of  gait and mobility  Rationale for Evaluation and Treatment: Rehabilitation  ONSET DATE: Chronic  SUBJECTIVE:   SUBJECTIVE STATEMENT: Patient says she is doing fine. Her shoulder is feeling better today.   EVAL: Patient presents to therapy with complaint of LE weakness and gait difficulty. She is previously known to this clinic. She says she is doing better than before but she was in the hospital for the better part of February due to altered labs and subsequent infection. She is now slated to have kidney and liver transplants coming up but does not yet have a set date.   PERTINENT HISTORY: Falls, weakness, DM, R shoulder THA Feb 2019 R knee pain (OA)  Cirrhosis   PAIN:  Are you having pain? no  PRECAUTIONS: Fall  WEIGHT BEARING RESTRICTIONS: No  FALLS:  Has patient fallen in last 6 months? No  LIVING ENVIRONMENT: Lives with: lives with their family and lives with their spouse Lives in: House/apartment Stairs: No Has following equipment at home: Single point cane and Environmental consultant - 2 wheeled  OCCUPATION: Retired/ Disability     PLOF: Needs  assistance with ADLs  PATIENT GOALS: To get stronger for surgery   NEXT MD VISIT: 01/21/23  OBJECTIVE:   DIAGNOSTIC FINDINGS: NA  COGNITION: Overall cognitive status: Within functional limits for tasks assessed     SENSATION: Not tested  LOWER EXTREMITY MMT: (unable to lay prone)   MMT Right eval Left eval  Hip flexion 4 4  Hip extension    Hip abduction 4- 4  Hip adduction    Hip internal rotation    Hip external rotation    Knee flexion    Knee extension 4 4  Ankle dorsiflexion 4+ 4  Ankle plantarflexion    Ankle inversion    Ankle eversion     (Blank rows = not tested)   FUNCTIONAL TESTS:  5 times sit to stand: 21 sec no UE  2 minute walk test: 60 feet in 1 minute using cane   GAIT:  Decreased stride, trunk flexed, slow speed, using RW  TODAY'S TREATMENT:                                                                                                                              DATE:  12/01/22 Nu step 4 min lv 3 seat 12 Heel raise/ toe raise 2 x 10 each  Sit to stand x10 Semi tandem stance 2 x 30"  4 inch step up x 10 each   11/28/2022 Gastrocnemius slant board stretch x 30" x 3 Sit-to-stand training, no UE, slightly elevated seat (emphasis on scooting, heel placement backwards, and trunk momentum) x 10  Heel/toe raises x 20 reps Tandem stance, firm surface, eyes open x 30" x 2 Mini squats x 3" x 10 x 2, little to no UE support Hip vectors, RTB x 5 x 4 on each  11/24/22 Eval    PATIENT EDUCATION:  Education details: on Eval  findings, POC and HEP Person educated: Patient Education method: Explanation Education comprehension: verbalized understanding  HOME EXERCISE PROGRAM: Access Code: [email or web address] URL: [email or web address] Date: 11/24/2022 Prepared by: Georges Lynch  Exercises - Seated Heel Toe Raises  - 2 x daily - 7 x weekly - 1 sets - 10 reps - Seated Long Arc Quad  - 2 x daily - 7 x weekly - 1 sets - 10 reps - Seated March   - 2 x daily - 7 x weekly - 1 sets - 10 reps - Supine Active Straight Leg Raise  - 7 x weekly - 1 sets - 10 reps - Heel Raises with Counter Support  - 2 x daily - 7 x weekly - 1 sets - 10 reps - 3-5" hold - Mini Squat with Counter Support  - 2 x daily - 7 x weekly - 1 sets - 10 reps - 3-5" hold - Sit to Stand with Counter Support  - 2 x daily - 7 x weekly - 1 sets - 10 reps  ASSESSMENT:  CLINICAL IMPRESSION: Session limited due to late arrival. Patient tolerated session well overall. Patient noting increased fatigue with activity and requires several short rest breaks due to fatigue. Added step taps for balance. Patient did well overall, but did have LOB x 1. Patient remains limited by decreased activity tolerance and decreased balance. Patient will continue to benefit from skilled therapy services to reduce remaining deficits and improve functional ability.    EVAL: Patient is a 67 y.o. female who presents to physical therapy with complaint of LE weakness and altered gait. Patient demonstrates decreased strength, balance deficits and gait abnormalities which are negatively impacting patient ability to perform ADLs and functional mobility tasks. Patient will benefit from skilled physical therapy services to address these deficits to improve level of function with ADLs, functional mobility tasks, and reduce risk for falls.    OBJECTIVE IMPAIRMENTS: Abnormal gait, decreased activity tolerance, decreased balance, decreased endurance, decreased mobility, difficulty walking, decreased strength, dizziness, hypomobility, and improper body mechanics.   ACTIVITY LIMITATIONS: carrying, lifting, bending, sitting, standing, squatting, stairs, transfers, bed mobility, and locomotion level  PARTICIPATION LIMITATIONS: meal prep, cleaning, laundry, driving, shopping, community activity, and yard work  PERSONAL FACTORS: Past/current experiences, Time since onset of injury/illness/exacerbation, and 3+  comorbidities: See history  are also affecting patient's functional outcome.   REHAB POTENTIAL: Fair See above  CLINICAL DECISION MAKING: Stable/uncomplicated  EVALUATION COMPLEXITY: Low   GOALS: SHORT TERM GOALS: Target date: 12/22/2022  Patient will be independent with initial HEP and self-management strategies to improve functional outcomes Baseline:  Goal status: INITIAL   2.  Patient will be able to perform stand x 5 in < 20 seconds to demonstrate improvement in functional mobility and reduced risk for falls.  Baseline:  Goal status: INITIAL  LONG TERM GOALS: Target date: 01/19/2023  Patient will be independent with advanced HEP and self-management strategies to improve functional outcomes Baseline:  Goal status: INITIAL  2.  Patient will be able to ambulate at least 250 feet during with LRAD to demonstrate improved ability to perform functional mobility and associated tasks. Baseline:60 feet in 1 minute using cane  Goal status: INITIAL  3.  Patient will be able to perform stand x 5 in < 15 seconds to demonstrate improvement in functional mobility and reduced risk for falls.  Baseline:  Goal status: INITIAL  4. Patient will have equal to or > 4+/5 MMT throughout tested BLE to improve ability to perform functional  mobility, stair ambulation and ADLs.  Baseline: See MMT Goal status: INITIAL  PLAN:  PT FREQUENCY: 2x/week  PT DURATION: 8 weeks  PLANNED INTERVENTIONS: Therapeutic exercises, Therapeutic activity, Neuromuscular re-education, Balance training, Gait training, Patient/Family education, Joint manipulation, Joint mobilization, Stair training, Aquatic Therapy, Dry Needling, Electrical stimulation, Spinal manipulation, Spinal mobilization, Cryotherapy, Moist heat, scar mobilization, Taping, Traction, Ultrasound, Biofeedback, Ionotophoresis /ml Dexamethasone, and Manual therapy.   PLAN FOR NEXT SESSION: Progress LE and core strength as tolerated. Gait and  balance progressions as able.   3:13 PM, 12/01/22 Georges Lynch PT DPT  Physical Therapist with [facility]  [phone]

## 2022-12-02 ENCOUNTER — Ambulatory Visit: Admission: RE | Admit: 2022-12-02 | Payer: Medicare Other | Source: Ambulatory Visit

## 2022-12-02 DIAGNOSIS — R5381 Other malaise: Secondary | ICD-10-CM | POA: Diagnosis not present

## 2022-12-02 DIAGNOSIS — N189 Chronic kidney disease, unspecified: Secondary | ICD-10-CM | POA: Diagnosis not present

## 2022-12-02 DIAGNOSIS — R9439 Abnormal result of other cardiovascular function study: Secondary | ICD-10-CM | POA: Diagnosis not present

## 2022-12-02 DIAGNOSIS — R031 Nonspecific low blood-pressure reading: Secondary | ICD-10-CM | POA: Diagnosis not present

## 2022-12-02 DIAGNOSIS — Z114 Encounter for screening for human immunodeficiency virus [HIV]: Secondary | ICD-10-CM | POA: Diagnosis not present

## 2022-12-02 DIAGNOSIS — I2510 Atherosclerotic heart disease of native coronary artery without angina pectoris: Secondary | ICD-10-CM | POA: Diagnosis not present

## 2022-12-02 DIAGNOSIS — Z6841 Body Mass Index (BMI) 40.0 and over, adult: Secondary | ICD-10-CM | POA: Diagnosis not present

## 2022-12-02 DIAGNOSIS — Z1159 Encounter for screening for other viral diseases: Secondary | ICD-10-CM | POA: Diagnosis not present

## 2022-12-02 DIAGNOSIS — K7210 Chronic hepatic failure without coma: Secondary | ICD-10-CM | POA: Diagnosis not present

## 2022-12-02 DIAGNOSIS — E1121 Type 2 diabetes mellitus with diabetic nephropathy: Secondary | ICD-10-CM | POA: Diagnosis not present

## 2022-12-02 DIAGNOSIS — Z01818 Encounter for other preprocedural examination: Secondary | ICD-10-CM | POA: Diagnosis not present

## 2022-12-02 DIAGNOSIS — I700 Atherosclerosis of aorta: Secondary | ICD-10-CM | POA: Diagnosis not present

## 2022-12-02 DIAGNOSIS — Z7682 Awaiting organ transplant status: Secondary | ICD-10-CM | POA: Diagnosis not present

## 2022-12-02 DIAGNOSIS — Z95828 Presence of other vascular implants and grafts: Secondary | ICD-10-CM | POA: Diagnosis not present

## 2022-12-02 DIAGNOSIS — R188 Other ascites: Secondary | ICD-10-CM | POA: Diagnosis not present

## 2022-12-02 DIAGNOSIS — K7460 Unspecified cirrhosis of liver: Secondary | ICD-10-CM | POA: Diagnosis not present

## 2022-12-02 DIAGNOSIS — N184 Chronic kidney disease, stage 4 (severe): Secondary | ICD-10-CM | POA: Diagnosis not present

## 2022-12-03 ENCOUNTER — Inpatient Hospital Stay: Payer: Medicare Other

## 2022-12-03 ENCOUNTER — Encounter (HOSPITAL_COMMUNITY): Payer: 59 | Admitting: Physical Therapy

## 2022-12-03 VITALS — BP 117/39 | HR 66 | Temp 97.4°F | Resp 16

## 2022-12-03 DIAGNOSIS — Z95828 Presence of other vascular implants and grafts: Secondary | ICD-10-CM

## 2022-12-03 DIAGNOSIS — N189 Chronic kidney disease, unspecified: Secondary | ICD-10-CM | POA: Diagnosis not present

## 2022-12-03 DIAGNOSIS — E559 Vitamin D deficiency, unspecified: Secondary | ICD-10-CM | POA: Diagnosis not present

## 2022-12-03 DIAGNOSIS — E538 Deficiency of other specified B group vitamins: Secondary | ICD-10-CM | POA: Diagnosis not present

## 2022-12-03 DIAGNOSIS — D500 Iron deficiency anemia secondary to blood loss (chronic): Secondary | ICD-10-CM

## 2022-12-03 DIAGNOSIS — K7460 Unspecified cirrhosis of liver: Secondary | ICD-10-CM | POA: Diagnosis not present

## 2022-12-03 DIAGNOSIS — I129 Hypertensive chronic kidney disease with stage 1 through stage 4 chronic kidney disease, or unspecified chronic kidney disease: Secondary | ICD-10-CM | POA: Diagnosis not present

## 2022-12-03 DIAGNOSIS — D631 Anemia in chronic kidney disease: Secondary | ICD-10-CM | POA: Diagnosis not present

## 2022-12-03 DIAGNOSIS — D696 Thrombocytopenia, unspecified: Secondary | ICD-10-CM | POA: Diagnosis not present

## 2022-12-03 DIAGNOSIS — D561 Beta thalassemia: Secondary | ICD-10-CM | POA: Diagnosis not present

## 2022-12-03 LAB — CBC
HCT: 21.6 % — ABNORMAL LOW (ref 36.0–46.0)
Hemoglobin: 6.7 g/dL — CL (ref 12.0–15.0)
MCH: 25.2 pg — ABNORMAL LOW (ref 26.0–34.0)
MCHC: 31 g/dL (ref 30.0–36.0)
MCV: 81.2 fL (ref 80.0–100.0)
Platelets: 108 10*3/uL — ABNORMAL LOW (ref 150–400)
RBC: 2.66 MIL/uL — ABNORMAL LOW (ref 3.87–5.11)
RDW: 20.6 % — ABNORMAL HIGH (ref 11.5–15.5)
WBC: 3 10*3/uL — ABNORMAL LOW (ref 4.0–10.5)
nRBC: 0 % (ref 0.0–0.2)

## 2022-12-03 LAB — PREPARE RBC (CROSSMATCH)

## 2022-12-03 LAB — BPAM RBC
Blood Product Expiration Date: 202405212359
Blood Product Expiration Date: 202405212359

## 2022-12-03 LAB — TYPE AND SCREEN: Unit division: 0

## 2022-12-03 LAB — SAMPLE TO BLOOD BANK

## 2022-12-03 MED ORDER — SODIUM CHLORIDE 0.9% FLUSH
10.0000 mL | INTRAVENOUS | Status: DC | PRN
  Administered 2022-12-03: 10 mL via INTRAVENOUS

## 2022-12-03 MED ORDER — HEPARIN SOD (PORK) LOCK FLUSH 100 UNIT/ML IV SOLN
500.0000 [IU] | Freq: Once | INTRAVENOUS | Status: AC
  Administered 2022-12-03: 500 [IU] via INTRAVENOUS

## 2022-12-03 NOTE — Progress Notes (Signed)
CRITICAL VALUE ALERT Critical value received:  HGB 6.7 Date of notification:  12-03-2022 Time of notification: 10:03 am.  Critical value read back:  Yes.   Nurse who received alert:  B.Merial Moritz RN. MD notified time and response:  R. Pennington PA @ 10:12 am. Patient will receive 1 unit of blood 12/04/2022

## 2022-12-03 NOTE — Progress Notes (Signed)
Patient called and states she is actively bleeding. HGB 6.7. Per R. Pennington PA give 2 units of blood for active bleeding on 12-04-2022. Patient aware and understanding verbalized.

## 2022-12-03 NOTE — Progress Notes (Signed)
Patients port flushed without difficulty.  Good blood return noted with no bruising or swelling noted at site.  Band aid applied.  VSS with discharge and left in satisfactory condition with no s/s of distress noted.   

## 2022-12-03 NOTE — Addendum Note (Signed)
Addended by: Dicky Doe D on: 12/03/2022 10:26 AM   Modules accepted: Orders

## 2022-12-04 ENCOUNTER — Inpatient Hospital Stay: Payer: Medicare Other

## 2022-12-04 VITALS — BP 104/47 | HR 73 | Temp 98.7°F | Resp 20

## 2022-12-04 DIAGNOSIS — Z95828 Presence of other vascular implants and grafts: Secondary | ICD-10-CM

## 2022-12-04 DIAGNOSIS — K7460 Unspecified cirrhosis of liver: Secondary | ICD-10-CM | POA: Diagnosis not present

## 2022-12-04 DIAGNOSIS — D500 Iron deficiency anemia secondary to blood loss (chronic): Secondary | ICD-10-CM

## 2022-12-04 DIAGNOSIS — D631 Anemia in chronic kidney disease: Secondary | ICD-10-CM | POA: Diagnosis not present

## 2022-12-04 DIAGNOSIS — D696 Thrombocytopenia, unspecified: Secondary | ICD-10-CM | POA: Diagnosis not present

## 2022-12-04 DIAGNOSIS — E559 Vitamin D deficiency, unspecified: Secondary | ICD-10-CM | POA: Diagnosis not present

## 2022-12-04 DIAGNOSIS — I129 Hypertensive chronic kidney disease with stage 1 through stage 4 chronic kidney disease, or unspecified chronic kidney disease: Secondary | ICD-10-CM | POA: Diagnosis not present

## 2022-12-04 DIAGNOSIS — D561 Beta thalassemia: Secondary | ICD-10-CM | POA: Diagnosis not present

## 2022-12-04 DIAGNOSIS — E538 Deficiency of other specified B group vitamins: Secondary | ICD-10-CM | POA: Diagnosis not present

## 2022-12-04 DIAGNOSIS — N189 Chronic kidney disease, unspecified: Secondary | ICD-10-CM | POA: Diagnosis not present

## 2022-12-04 LAB — BPAM RBC
Blood Product Expiration Date: 202405212359
Blood Product Expiration Date: 202405212359
Unit Type and Rh: 6200

## 2022-12-04 LAB — TYPE AND SCREEN
ABO/RH(D): A POS
Antibody Screen: NEGATIVE

## 2022-12-04 MED ORDER — HEPARIN SOD (PORK) LOCK FLUSH 100 UNIT/ML IV SOLN
500.0000 [IU] | Freq: Once | INTRAVENOUS | Status: AC
  Administered 2022-12-04: 500 [IU] via INTRAVENOUS

## 2022-12-04 MED ORDER — SODIUM CHLORIDE 0.9% IV SOLUTION
250.0000 mL | Freq: Once | INTRAVENOUS | Status: AC
  Administered 2022-12-04: 250 mL via INTRAVENOUS

## 2022-12-04 MED ORDER — SODIUM CHLORIDE 0.9% FLUSH
10.0000 mL | INTRAVENOUS | Status: DC | PRN
  Administered 2022-12-04: 10 mL via INTRAVENOUS

## 2022-12-04 MED ORDER — SODIUM CHLORIDE 0.9% FLUSH: 10.0000 mL | INTRAVENOUS | Status: AC | PRN

## 2022-12-04 MED ORDER — HEPARIN SOD (PORK) LOCK FLUSH 100 UNIT/ML IV SOLN: 500.0000 [IU] | Freq: Every day | INTRAVENOUS | Status: AC | PRN

## 2022-12-04 NOTE — Progress Notes (Signed)
Patient presents today for2 units of PRBC transfusion.  Patient is in satisfactory condition with no new complaints voiced.  Vital signs are stable.  We will proceed with infusion per provider orders.      Patient tolerated treatment well with no complaints voiced.  Patient left via wheel chair in stable condition.  Vital signs stable at discharge.  Follow up as scheduled.

## 2022-12-04 NOTE — Patient Instructions (Signed)
[facility]  Discharge Instructions: Thank you for choosing [facility] to provide your oncology and hematology care.  If you have a lab appointment with the Cancer Center - please note that after April 8th, 2024, all labs will be drawn in the cancer center.  You do not have to check in or register with the main entrance as you have in the past but will complete your check-in in the cancer center.  Wear comfortable clothing and clothing appropriate for easy access to any Portacath or PICC line.   We strive to give you quality time with your provider. You may need to reschedule your appointment if you arrive late (15 or more minutes).  Arriving late affects you and other patients whose appointments are after yours.  Also, if you miss three or more appointments without notifying the office, you may be dismissed from the clinic at the provider's discretion.      For prescription refill requests, have your pharmacy contact our office and allow 72 hours for refills to be completed.    Today you received the following 2 units of PRBC.   To help prevent nausea and vomiting after your treatment, we encourage you to take your nausea medication as directed.  BELOW ARE SYMPTOMS THAT SHOULD BE REPORTED IMMEDIATELY: *FEVER GREATER THAN 100.4 F (38 C) OR HIGHER *CHILLS OR SWEATING *NAUSEA AND VOMITING THAT IS NOT CONTROLLED WITH YOUR NAUSEA MEDICATION *UNUSUAL SHORTNESS OF BREATH *UNUSUAL BRUISING OR BLEEDING *URINARY PROBLEMS (pain or burning when urinating, or frequent urination) *BOWEL PROBLEMS (unusual diarrhea, constipation, pain near the anus) TENDERNESS IN MOUTH AND THROAT WITH OR WITHOUT PRESENCE OF ULCERS (sore throat, sores in mouth, or a toothache) UNUSUAL RASH, SWELLING OR PAIN  UNUSUAL VAGINAL DISCHARGE OR ITCHING   Items with * indicate a potential emergency and should be followed up as soon as possible or go to the Emergency Department if any problems  should occur.  Please show the CHEMOTHERAPY ALERT CARD or IMMUNOTHERAPY ALERT CARD at check-in to the Emergency Department and triage nurse.  Should you have questions after your visit or need to cancel or reschedule your appointment, please contact [facility] AT [facility] [phone]  and follow the prompts.  Office hours are 8:00 a.m. to 4:30 p.m. Monday - Friday. Please note that voicemails left after 4:00 p.m. may not be returned until the following business day.  We are closed weekends and major holidays. You have access to a nurse at all times for urgent questions. Please call the main number to the clinic [phone] and follow the prompts.  For any non-urgent questions, you may also contact your provider using MyChart. We now offer e-Visits for anyone 37 and older to request care online for non-urgent symptoms. For details visit [email or web address].   Also download the MyChart app! Go to the app store, search "MyChart", open the app, select [place], and log in with your MyChart username and password.

## 2022-12-05 LAB — TYPE AND SCREEN: Unit division: 0

## 2022-12-05 LAB — BPAM RBC

## 2022-12-09 ENCOUNTER — Ambulatory Visit
Admission: RE | Admit: 2022-12-09 | Discharge: 2022-12-09 | Disposition: A | Discharge status: Home / Self Care | Payer: Medicare Other | Source: Ambulatory Visit | Attending: Gastroenterology | Admitting: Gastroenterology

## 2022-12-09 DIAGNOSIS — R188 Other ascites: Secondary | ICD-10-CM | POA: Diagnosis not present

## 2022-12-09 DIAGNOSIS — K7460 Unspecified cirrhosis of liver: Secondary | ICD-10-CM | POA: Diagnosis not present

## 2022-12-09 DIAGNOSIS — K7581 Nonalcoholic steatohepatitis (NASH): Secondary | ICD-10-CM | POA: Diagnosis not present

## 2022-12-09 MED ORDER — ALBUMIN HUMAN 25 % IV SOLN
12.5000 g | Freq: Once | INTRAVENOUS | Status: AC
  Administered 2022-12-09: 12.5 g via INTRAVENOUS

## 2022-12-09 MED ORDER — ALBUMIN HUMAN 25 % IV SOLN
INTRAVENOUS | Status: AC
  Filled 2022-12-09: qty 100

## 2022-12-09 MED ORDER — ALBUMIN HUMAN 25 % IV SOLN
25.0000 g | Freq: Once | INTRAVENOUS | Status: AC
  Administered 2022-12-09: 25 g via INTRAVENOUS

## 2022-12-09 MED ORDER — HEPARIN SOD (PORK) LOCK FLUSH 100 UNIT/ML IV SOLN
INTRAVENOUS | Status: AC
  Filled 2022-12-09: qty 5

## 2022-12-09 NOTE — Procedures (Signed)
PROCEDURE SUMMARY:  Successful US guided paracentesis from right abdomen.  Yielded 5.8 L of clear yellow fluid.  No immediate complications.  Pt tolerated well.   Specimen not sent for labs.  EBL < 2 mL  Mickie Kay, NP 12/09/2022 12:32 PM  Patient is status post TIPS and TIPS revision at [facility]. Patient currently followed by [facility].

## 2022-12-10 ENCOUNTER — Inpatient Hospital Stay: Payer: Medicare Other

## 2022-12-10 ENCOUNTER — Encounter (HOSPITAL_COMMUNITY): Payer: 59

## 2022-12-10 DIAGNOSIS — G4733 Obstructive sleep apnea (adult) (pediatric): Secondary | ICD-10-CM | POA: Diagnosis not present

## 2022-12-10 DIAGNOSIS — D61818 Other pancytopenia: Secondary | ICD-10-CM

## 2022-12-10 DIAGNOSIS — E1140 Type 2 diabetes mellitus with diabetic neuropathy, unspecified: Secondary | ICD-10-CM | POA: Diagnosis not present

## 2022-12-10 DIAGNOSIS — N184 Chronic kidney disease, stage 4 (severe): Secondary | ICD-10-CM | POA: Diagnosis not present

## 2022-12-10 DIAGNOSIS — D561 Beta thalassemia: Secondary | ICD-10-CM | POA: Diagnosis not present

## 2022-12-10 DIAGNOSIS — N189 Chronic kidney disease, unspecified: Secondary | ICD-10-CM | POA: Diagnosis not present

## 2022-12-10 DIAGNOSIS — D631 Anemia in chronic kidney disease: Secondary | ICD-10-CM | POA: Diagnosis not present

## 2022-12-10 DIAGNOSIS — E559 Vitamin D deficiency, unspecified: Secondary | ICD-10-CM | POA: Diagnosis not present

## 2022-12-10 DIAGNOSIS — D500 Iron deficiency anemia secondary to blood loss (chronic): Secondary | ICD-10-CM | POA: Diagnosis not present

## 2022-12-10 DIAGNOSIS — I129 Hypertensive chronic kidney disease with stage 1 through stage 4 chronic kidney disease, or unspecified chronic kidney disease: Secondary | ICD-10-CM | POA: Diagnosis not present

## 2022-12-10 DIAGNOSIS — E1122 Type 2 diabetes mellitus with diabetic chronic kidney disease: Secondary | ICD-10-CM | POA: Diagnosis not present

## 2022-12-10 DIAGNOSIS — F325 Major depressive disorder, single episode, in full remission: Secondary | ICD-10-CM | POA: Diagnosis not present

## 2022-12-10 DIAGNOSIS — K7460 Unspecified cirrhosis of liver: Secondary | ICD-10-CM | POA: Diagnosis not present

## 2022-12-10 DIAGNOSIS — Z01818 Encounter for other preprocedural examination: Secondary | ICD-10-CM | POA: Diagnosis not present

## 2022-12-10 DIAGNOSIS — D696 Thrombocytopenia, unspecified: Secondary | ICD-10-CM | POA: Diagnosis not present

## 2022-12-10 DIAGNOSIS — Z6841 Body Mass Index (BMI) 40.0 and over, adult: Secondary | ICD-10-CM | POA: Diagnosis not present

## 2022-12-10 DIAGNOSIS — E538 Deficiency of other specified B group vitamins: Secondary | ICD-10-CM | POA: Diagnosis not present

## 2022-12-10 LAB — COMPREHENSIVE METABOLIC PANEL
ALT: 24 U/L (ref 0–44)
AST: 45 U/L — ABNORMAL HIGH (ref 15–41)
Albumin: 2.3 g/dL — ABNORMAL LOW (ref 3.5–5.0)
Alkaline Phosphatase: 129 U/L — ABNORMAL HIGH (ref 38–126)
Anion gap: 6 (ref 5–15)
BUN: 43 mg/dL — ABNORMAL HIGH (ref 8–23)
CO2: 20 mmol/L — ABNORMAL LOW (ref 22–32)
Calcium: 8 mg/dL — ABNORMAL LOW (ref 8.9–10.3)
Chloride: 108 mmol/L (ref 98–111)
Creatinine, Ser: 1.44 mg/dL — ABNORMAL HIGH (ref 0.44–1.00)
GFR, Estimated: 40 mL/min — ABNORMAL LOW (ref >60)
Glucose, Bld: 134 mg/dL — ABNORMAL HIGH (ref 70–99)
Potassium: 4.2 mmol/L (ref 3.5–5.1)
Sodium: 134 mmol/L — ABNORMAL LOW (ref 135–145)
Total Bilirubin: 1.4 mg/dL — ABNORMAL HIGH (ref 0.3–1.2)
Total Protein: 4.8 g/dL — ABNORMAL LOW (ref 6.5–8.1)

## 2022-12-10 LAB — SAMPLE TO BLOOD BANK

## 2022-12-10 LAB — CBC WITH DIFFERENTIAL/PLATELET
Abs Immature Granulocytes: 0.01 10*3/uL (ref 0.00–0.07)
Basophils Absolute: 0 10*3/uL (ref 0.0–0.1)
Basophils Relative: 0 %
Eosinophils Absolute: 0.2 10*3/uL (ref 0.0–0.5)
Eosinophils Relative: 7 %
HCT: 20.3 % — ABNORMAL LOW (ref 36.0–46.0)
Hemoglobin: 6.2 g/dL — CL (ref 12.0–15.0)
Immature Granulocytes: 0 %
Lymphocytes Relative: 22 %
Lymphs Abs: 0.6 10*3/uL — ABNORMAL LOW (ref 0.7–4.0)
MCH: 24.5 pg — ABNORMAL LOW (ref 26.0–34.0)
MCHC: 30.5 g/dL (ref 30.0–36.0)
MCV: 80.2 fL (ref 80.0–100.0)
Monocytes Absolute: 0.4 10*3/uL (ref 0.1–1.0)
Monocytes Relative: 13 %
Neutro Abs: 1.6 10*3/uL — ABNORMAL LOW (ref 1.7–7.7)
Neutrophils Relative %: 58 %
Platelets: 113 10*3/uL — ABNORMAL LOW (ref 150–400)
RBC: 2.53 MIL/uL — ABNORMAL LOW (ref 3.87–5.11)
RDW: 20.2 % — ABNORMAL HIGH (ref 11.5–15.5)
WBC: 2.8 10*3/uL — ABNORMAL LOW (ref 4.0–10.5)
nRBC: 0 % (ref 0.0–0.2)

## 2022-12-10 LAB — VITAMIN D 25 HYDROXY (VIT D DEFICIENCY, FRACTURES): Vit D, 25-Hydroxy: 33.75 ng/mL (ref 30–100)

## 2022-12-10 LAB — FOLATE: Folate: 16.3 ng/mL (ref >5.9)

## 2022-12-10 LAB — FERRITIN: Ferritin: 636 ng/mL — ABNORMAL HIGH (ref 11–307)

## 2022-12-10 LAB — IRON AND TIBC
Iron: 45 ug/dL (ref 28–170)
Saturation Ratios: 31 % (ref 10.4–31.8)
TIBC: 147 ug/dL — ABNORMAL LOW (ref 250–450)
UIBC: 102 ug/dL

## 2022-12-10 LAB — PREPARE RBC (CROSSMATCH)

## 2022-12-10 LAB — TYPE AND SCREEN

## 2022-12-10 LAB — VITAMIN B12: Vitamin B-12: 698 pg/mL (ref 180–914)

## 2022-12-10 MED ORDER — SODIUM CHLORIDE 0.9% FLUSH
10.0000 mL | Freq: Once | INTRAVENOUS | Status: AC
  Administered 2022-12-10: 10 mL via INTRAVENOUS

## 2022-12-10 MED ORDER — HEPARIN SOD (PORK) LOCK FLUSH 100 UNIT/ML IV SOLN
500.0000 [IU] | Freq: Once | INTRAVENOUS | Status: AC
  Administered 2022-12-10: 500 [IU] via INTRAVENOUS

## 2022-12-10 NOTE — Addendum Note (Signed)
Addended by: Harrel Lemon on: 12/10/2022 10:55 AM   Modules accepted: Orders

## 2022-12-10 NOTE — Progress Notes (Signed)
Port flushed with good blood return noted. No bruising or swelling at site. Bandaid applied and patient discharged in satisfactory condition. VVS stable with no signs or symptoms of distressed noted. 

## 2022-12-10 NOTE — Progress Notes (Signed)
CRITICAL VALUE ALERT Critical value received:  hgb 6.2 Date of notification:  12-10-22 Time of notification: 1044 Critical value read back:  Yes.   Nurse who received alert:  C.Marcelyn Ruppe RN MD notified time and response: Reb. Pennington PA-C.  2 units of blood per orders.  Called patient to let her know.

## 2022-12-11 ENCOUNTER — Inpatient Hospital Stay: Payer: Medicare Other

## 2022-12-11 DIAGNOSIS — D631 Anemia in chronic kidney disease: Secondary | ICD-10-CM | POA: Diagnosis not present

## 2022-12-11 DIAGNOSIS — D696 Thrombocytopenia, unspecified: Secondary | ICD-10-CM | POA: Diagnosis not present

## 2022-12-11 DIAGNOSIS — D561 Beta thalassemia: Secondary | ICD-10-CM | POA: Diagnosis not present

## 2022-12-11 DIAGNOSIS — I129 Hypertensive chronic kidney disease with stage 1 through stage 4 chronic kidney disease, or unspecified chronic kidney disease: Secondary | ICD-10-CM | POA: Diagnosis not present

## 2022-12-11 DIAGNOSIS — D500 Iron deficiency anemia secondary to blood loss (chronic): Secondary | ICD-10-CM | POA: Diagnosis not present

## 2022-12-11 DIAGNOSIS — E559 Vitamin D deficiency, unspecified: Secondary | ICD-10-CM | POA: Diagnosis not present

## 2022-12-11 DIAGNOSIS — E538 Deficiency of other specified B group vitamins: Secondary | ICD-10-CM | POA: Diagnosis not present

## 2022-12-11 DIAGNOSIS — K7460 Unspecified cirrhosis of liver: Secondary | ICD-10-CM | POA: Diagnosis not present

## 2022-12-11 DIAGNOSIS — N189 Chronic kidney disease, unspecified: Secondary | ICD-10-CM | POA: Diagnosis not present

## 2022-12-11 LAB — TYPE AND SCREEN: Unit division: 0

## 2022-12-11 LAB — BPAM RBC
Blood Product Expiration Date: 202405242359
ISSUE DATE / TIME: 202404251013
Unit Type and Rh: 6200

## 2022-12-11 MED ORDER — HEPARIN SOD (PORK) LOCK FLUSH 100 UNIT/ML IV SOLN
500.0000 [IU] | Freq: Every day | INTRAVENOUS | Status: AC | PRN
  Administered 2022-12-11: 500 [IU]

## 2022-12-11 MED ORDER — SODIUM CHLORIDE 0.9% IV SOLUTION
250.0000 mL | Freq: Once | INTRAVENOUS | Status: AC
  Administered 2022-12-11: 250 mL via INTRAVENOUS

## 2022-12-11 MED ORDER — SODIUM CHLORIDE 0.9% FLUSH
10.0000 mL | INTRAVENOUS | Status: AC | PRN
  Administered 2022-12-11: 10 mL

## 2022-12-11 NOTE — Progress Notes (Addendum)
Pt presents today for 2 units of blood per provider's order. Vital signs stable and pt voiced no new complaints at this time.  2 units of blood given today per MD orders. Tolerated infusion without adverse affects. Vital signs stable. No complaints at this time. Discharged from clinic via wheelchair in stable condition. Alert and oriented x 3. F/U with [facility] as scheduled.   

## 2022-12-11 NOTE — Patient Instructions (Signed)
[facility]  Discharge Instructions: Thank you for choosing [facility] to provide your oncology and hematology care.  If you have a lab appointment with the Cancer Center - please note that after April 8th, 2024, all labs will be drawn in the cancer center.  You do not have to check in or register with the main entrance as you have in the past but will complete your check-in in the cancer center.  Wear comfortable clothing and clothing appropriate for easy access to any Portacath or PICC line.   We strive to give you quality time with your provider. You may need to reschedule your appointment if you arrive late (15 or more minutes).  Arriving late affects you and other patients whose appointments are after yours.  Also, if you miss three or more appointments without notifying the office, you may be dismissed from the clinic at the provider's discretion.      For prescription refill requests, have your pharmacy contact our office and allow 72 hours for refills to be completed.    Today you received  2 units of blood today     BELOW ARE SYMPTOMS THAT SHOULD BE REPORTED IMMEDIATELY: *FEVER GREATER THAN 100.4 F (38 C) OR HIGHER *CHILLS OR SWEATING *NAUSEA AND VOMITING THAT IS NOT CONTROLLED WITH YOUR NAUSEA MEDICATION *UNUSUAL SHORTNESS OF BREATH *UNUSUAL BRUISING OR BLEEDING *URINARY PROBLEMS (pain or burning when urinating, or frequent urination) *BOWEL PROBLEMS (unusual diarrhea, constipation, pain near the anus) TENDERNESS IN MOUTH AND THROAT WITH OR WITHOUT PRESENCE OF ULCERS (sore throat, sores in mouth, or a toothache) UNUSUAL RASH, SWELLING OR PAIN  UNUSUAL VAGINAL DISCHARGE OR ITCHING   Items with * indicate a potential emergency and should be followed up as soon as possible or go to the Emergency Department if any problems should occur.  Please show the CHEMOTHERAPY ALERT CARD or IMMUNOTHERAPY ALERT CARD at check-in to the Emergency Department  and triage nurse.  Should you have questions after your visit or need to cancel or reschedule your appointment, please contact [facility] AT [facility] [phone]  and follow the prompts.  Office hours are 8:00 a.m. to 4:30 p.m. Monday - Friday. Please note that voicemails left after 4:00 p.m. may not be returned until the following business day.  We are closed weekends and major holidays. You have access to a nurse at all times for urgent questions. Please call the main number to the clinic [phone] and follow the prompts.  For any non-urgent questions, you may also contact your provider using MyChart. We now offer e-Visits for anyone 61 and older to request care online for non-urgent symptoms. For details visit [email or web address].   Also download the MyChart app! Go to the app store, search "MyChart", open the app, select [place], and log in with your MyChart username and password.

## 2022-12-12 ENCOUNTER — Ambulatory Visit
Admission: RE | Admit: 2022-12-12 | Discharge: 2022-12-12 | Disposition: A | Discharge status: Home / Self Care | Payer: 59 | Source: Ambulatory Visit | Attending: Radiology | Admitting: Radiology

## 2022-12-12 ENCOUNTER — Other Ambulatory Visit: Payer: Self-pay | Admitting: Interventional Radiology

## 2022-12-12 ENCOUNTER — Ambulatory Visit (HOSPITAL_COMMUNITY): Payer: Medicare Other

## 2022-12-12 DIAGNOSIS — K7460 Unspecified cirrhosis of liver: Secondary | ICD-10-CM

## 2022-12-12 DIAGNOSIS — R2689 Other abnormalities of gait and mobility: Secondary | ICD-10-CM

## 2022-12-12 DIAGNOSIS — R262 Difficulty in walking, not elsewhere classified: Secondary | ICD-10-CM

## 2022-12-12 DIAGNOSIS — M6281 Muscle weakness (generalized): Secondary | ICD-10-CM | POA: Diagnosis not present

## 2022-12-12 DIAGNOSIS — K625 Hemorrhage of anus and rectum: Secondary | ICD-10-CM

## 2022-12-12 HISTORY — PX: IR RADIOLOGIST EVAL & MGMT: IMG5224

## 2022-12-12 LAB — BPAM RBC
Blood Product Expiration Date: 202405242359
ISSUE DATE / TIME: 202404251155
Unit Type and Rh: 6200

## 2022-12-12 LAB — TYPE AND SCREEN
ABO/RH(D): A POS
Antibody Screen: NEGATIVE
Unit division: 0

## 2022-12-12 LAB — COPPER, SERUM: Copper: 102 ug/dL (ref 80–158)

## 2022-12-12 NOTE — Progress Notes (Signed)
Referring Physician(s): Merlyn Lot, MD   Reason for follow up:  1 month after repeat superior rectal artery embolization, presenting via virtual telephone visit   History of present illness: Mrs. 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  She underwent "emborrhoid" procedure on 02/07/22 which significantly improved her rectal bleeding initially which gradually returned.  She underwent repeat angiogram and embolization of the duplicated bilateral superior rectal arteries on 09/05/22 due to persistent anemia requiring transfusions.  She was recently diagnosed with thalassemia.  Unfortunately, she presented to [facility] again in March for rectal bleeding and associated profound anemia and underwent additional superior rectal embolization by Dr. Archer Asa on 11/05/22.  She had a port placed by Dr. Miles Costain on 11/25/22.  She has continued to require near-weekly blood transfusions, most recent on 12/11/22, and weekly paracenteses at [facility].     She states that her rectal bleeding has improved from procedure, though still occurring, and noticeably worse when her ascites is greatest.  She has no known further procedural plans with [facility] for TIPS revision.  Has several steps to complete in Transplant workup.     Past Medical History:  Diagnosis Date   Anemia in chronic kidney disease (CKD) 02/07/2022   Anxiety    Arthritis    Cirrhosis of liver (HCC)    Diabetes mellitus without complication (HCC)    Dyspnea    DOE   GERD (gastroesophageal reflux disease)    Grade IV internal hemorrhoids    Contingency plan for any future admissions for severe anemia in the setting of persistent GI bleed:  nuclear medicine tagged RBC scan to assess for location of GI bleeding - if from rectum, would then proceed to angiogram  with possible repeat embolization.  Please notify IR in this case.   Hepatitis    PAST   Hypertension    Neuropathy    Neuropathy, diabetic (HCC)    Pneumonia    Sinus complaint    Sleep apnea    CPAP   Thalassemia minor 1992    Past Surgical History:  Procedure Laterality Date   ABDOMINAL HYSTERECTOMY     BREAST BIOPSY Right 02/15/2018   [place] bx 6-6:30 ribbon shape, ONE CORE FRAGMENT WITH FIBROSIS. ONE CORE FRAGMENT WITH PORTION OF A DILATED   BREAST BIOPSY Right 02/15/2018   [place] bx 9:00 heart shape, USUAL DUCTAL HYPERPLASIA   BREAST LUMPECTOMY Right 03/09/2018   Procedure: BREAST LUMPECTOMY x 2;  Surgeon: Sung Amabile, DO;  Location: [facility] ORS;  Service: General;  Laterality: Right;   CATARACT EXTRACTION W/PHACO Left 06/11/2016   Procedure: CATARACT EXTRACTION PHACO AND INTRAOCULAR LENS PLACEMENT (IOC);  Surgeon: Sallee Lange, MD;  Location: [facility] ORS;  Service: Ophthalmology;  Laterality: Left;  Lot # [id number] H US:01:38.6 AP%:26.4 CDE:44.15   CATARACT EXTRACTION W/PHACO Right 06/15/2018   Procedure: CATARACT EXTRACTION PHACO AND INTRAOCULAR LENS PLACEMENT (IOC);  Surgeon: Galen Manila, MD;  Location: [facility] ORS;  Service: Ophthalmology;  Laterality: Right;  [place] 00:38.2 CDE 4.23 Fluid Pack Lot # [id number]   COLONOSCOPIES     COLONOSCOPY WITH PROPOFOL N/A 10/10/2020   Procedure: COLONOSCOPY WITH PROPOFOL;  Surgeon: Regis Bill, MD;  Location: [facility] ENDOSCOPY;  Service: Endoscopy;  Laterality: N/A;   COLONOSCOPY WITH PROPOFOL N/A 11/20/2020   Procedure: COLONOSCOPY WITH PROPOFOL;  Surgeon: Regis Bill, MD;  Location: [facility] ENDOSCOPY;  Service: Endoscopy;  Laterality: N/A;  DM STAT CBC, BMP COVID POSITIVE 09/02/2020   COLONOSCOPY WITH PROPOFOL N/A 06/19/2022   Procedure: COLONOSCOPY WITH PROPOFOL;  Surgeon: Regis Bill, MD;  Location: [facility] ENDOSCOPY;  Service: Endoscopy;  Laterality: N/A;   CTR     ESOPHAGOGASTRODUODENOSCOPY (EGD) WITH PROPOFOL N/A 10/10/2020    Procedure: ESOPHAGOGASTRODUODENOSCOPY (EGD) WITH PROPOFOL;  Surgeon: Regis Bill, MD;  Location: [facility] ENDOSCOPY;  Service: Endoscopy;  Laterality: N/A;  COVID POSITIVE 10/08/2020   FLEXIBLE SIGMOIDOSCOPY N/A 02/06/2022   Procedure: FLEXIBLE SIGMOIDOSCOPY;  Surgeon: Regis Bill, MD;  Location: [facility] ENDOSCOPY;  Service: Endoscopy;  Laterality: N/A;  Patient requests anesthesia   IR ANGIOGRAM SELECTIVE EACH ADDITIONAL VESSEL  02/07/2022   IR ANGIOGRAM SELECTIVE EACH ADDITIONAL VESSEL  02/07/2022   IR ANGIOGRAM SELECTIVE EACH ADDITIONAL VESSEL  09/05/2022   IR ANGIOGRAM VISCERAL SELECTIVE  02/07/2022   IR ANGIOGRAM VISCERAL SELECTIVE  09/05/2022   IR EMBO ART  VEN HEMORR LYMPH EXTRAV  INC GUIDE ROADMAPPING  09/05/2022   IR EMBO ART  VEN HEMORR LYMPH EXTRAV  INC GUIDE ROADMAPPING  11/05/2022   IR EMBO ARTERIAL NOT HEMORR HEMANG INC GUIDE ROADMAPPING  02/07/2022   IR IMAGING GUIDED PORT INSERTION  06/13/2022   IR IMAGING GUIDED PORT INSERTION  11/25/2022   IR PARACENTESIS  12/17/2021   IR PARACENTESIS  03/25/2022   IR PARACENTESIS  11/25/2022   IR RADIOLOGIST EVAL & MGMT  03/18/2022   IR RADIOLOGIST EVAL & MGMT  05/07/2022   IR RADIOLOGIST EVAL & MGMT  05/16/2022   IR RADIOLOGIST EVAL & MGMT  10/13/2022   IR US GUIDE VASC ACCESS RIGHT  02/07/2022   IR US GUIDE VASC ACCESS RIGHT  09/05/2022   JOINT REPLACEMENT     KNEE SURGERY Right 09/24/2017   plates and pins   PORT-A-CATH REMOVAL N/A 09/23/2022   Procedure: MINOR REMOVAL PORT-A-CATH;  Surgeon: Franky Macho, MD;  Location: [facility] OR[facility];  Service: General;  Laterality: N/A;   TEE WITHOUT CARDIOVERSION N/A 09/23/2022   Procedure: TRANSESOPHAGEAL ECHOCARDIOGRAM (TEE);  Surgeon: Antoine Poche, MD;  Location: [facility];  Service: Endoscopy;  Laterality: N/A;   TOTAL SHOULDER ARTHROPLASTY Right 09/27/2015    Allergies: Gramineae pollens and Latex  Medications: Prior to Admission medications   Medication Sig Start Date End Date Taking? Authorizing Provider   acetaminophen (TYLENOL) 650 MG CR tablet Take 1,300 mg by mouth daily as needed for pain.    [provider]  albuterol (VENTOLIN HFA) 108 (90 Base) MCG/ACT inhaler Inhale 2 puffs into the lungs every 6 (six) hours as needed for wheezing or shortness of breath. 02/10/22   Alford Highland, MD  atorvastatin (LIPITOR) 10 MG tablet Take 1 tablet (10 mg total) by mouth daily. 06/20/22   Enedina Finner, MD  ciprofloxacin (CIPRO) 500 MG tablet Take 500 mg by mouth daily with breakfast.    [provider]  diphenhydrAMINE (BENADRYL) 25 MG tablet Take 25 mg by mouth 2 (two) times daily as needed for itching.    [provider]  ergocalciferol (VITAMIN D2) 1.25 MG (50000 UT) capsule Take 1 capsule (50,000 Units total) by mouth every 14 (fourteen) days. 09/01/22   Carnella Guadalajara, PA-C  gabapentin (NEURONTIN) 400 MG capsule Take 400 mg by mouth 2 (two) times daily. 01/07/18   [provider]  melatonin 5 MG TABS Take 10 mg by mouth at bedtime as needed (sleep).    [provider]  midodrine (PROAMATINE) 10 MG tablet Take 10 mg by mouth 3 (three) times daily.    [provider]  midodrine (PROAMATINE) 10 MG tablet Take by mouth.    [provider]  Multiple Vitamins-Minerals (MULTIVITAMIN WITH MINERALS) tablet Take 1 tablet by mouth daily.    [provider]  oxymetazoline (AFRIN) 0.05 % nasal spray Place 1 spray into both nostrils daily as needed for congestion.    [provider]  polyethylene glycol (MIRALAX / GLYCOLAX) 17 g packet Take 17 g by mouth 2 (two) times daily. 06/20/22   Enedina Finner, MD  potassium chloride SA (KLOR-CON M) 20 MEQ tablet Take 1 tablet (20 mEq total) by mouth 2 (two) times daily. 10/26/22   Catarina Hartshorn, MD  sertraline (ZOLOFT) 50 MG tablet Take 50 mg by mouth daily. 12/12/17   [provider]  spironolactone (ALDACTONE) 25 MG tablet Take 1 tablet (25 mg total) by mouth daily. 10/26/22   Catarina Hartshorn,  MD  torsemide 40 MG TABS Take 40 mg by mouth daily. 10/26/22   Catarina Hartshorn, MD  vitamin B-12 (CYANOCOBALAMIN) 1000 MCG tablet Take 1,000 mcg by mouth daily.    [provider]  witch hazel-glycerin (TUCKS) pad Apply topically as needed for hemorrhoids. 03/07/22   Alford Highland, MD     Family History  Problem Relation Age of Onset   Breast cancer Mother 70   Lymphoma Mother    Diabetes Father    Kidney cancer Father    Heart disease Father    Diabetes Sister    Breast cancer Sister 55    Social History   Socioeconomic History   Marital status: Married    Spouse name: Not on file   Number of children: 0   Years of education: Not on file   Highest education level: Not on file  Occupational History   Occupation: EMPLOYED  Tobacco Use   Smoking status: Never   Smokeless tobacco: Never  Vaping Use   Vaping Use: Never used  Substance and Sexual Activity   Alcohol use: No   Drug use: Never   Sexual activity: Not Currently  Other Topics Concern   Not on file  Social History Narrative   Not on file   Social Determinants of Health   Financial Resource Strain: Low Risk  (04/04/2022)   Overall Financial Resource Strain (CARDIA)    Difficulty of Paying Living Expenses: Not hard at all  Food Insecurity: No Food Insecurity (11/03/2022)   Hunger Vital Sign    Worried About Running Out of Food in the Last Year: Never true    Ran Out of Food in the Last Year: Never true  Transportation Needs: No Transportation Needs (11/03/2022)   PRAPARE - Administrator, Civil Service (Medical): No    Lack of Transportation (Non-Medical): No  Physical Activity: Inactive (04/04/2022)   Exercise Vital Sign    Days of Exercise per Week: 0 days    Minutes of Exercise per Session: 0 min  Stress: No Stress Concern Present (04/04/2022)   [employer] of Occupational Health - Occupational Stress Questionnaire    Feeling of Stress : Only a little  Social Connections: Moderately  Isolated (04/04/2022)   Social Connection and Isolation Panel [NHANES]    Frequency of Communication with Friends and Family: More than three times a week    Frequency of Social Gatherings with Friends and Family: Three times a week    Attends Religious Services: Never  Active Member of Clubs or Organizations: No    Attends Banker Meetings: Never    Marital Status: Married     Vital Signs: There were no vitals taken for this visit.  No physical examination was performed in lieu of virtual telephone clinic visit.   Imaging: Angio 09/05/22  Pre    Post  Angio 11/05/22  Pre   Post embo   Labs:  CBC: Recent Labs    11/19/22 1113 11/26/22 1042 12/03/22 0922 12/10/22 1003  WBC 2.7* 2.5* 3.0* 2.8*  HGB 7.2* 6.5* 6.7* 6.2*  HCT 22.9* 21.2* 21.6* 20.3*  PLT 113* 116* 108* 113*    COAGS: Recent Labs    09/17/22 2111 09/18/22 1253 09/19/22 0359 11/03/22 0827 11/17/22 0114  INR 1.4* 1.4*  --  1.4* 1.1  APTT 34 35 36  --  33    BMP: Recent Labs    11/04/22 0432 11/06/22 0709 11/17/22 0114 12/10/22 1003  NA 139 140 133* 134*  K 3.8 3.2* 4.0 4.2  CL 107 108 104 108  CO2 22 22 22  20*  GLUCOSE 140* 175* 135* 134*  BUN 52* 47* 45* 43*  CALCIUM 8.2* 7.8* 7.9* 8.0*  CREATININE 2.27* 2.12* 1.94* 1.44*  GFRNONAA 24* 26* 28* 40*    LIVER FUNCTION TESTS: Recent Labs    11/03/22 1833 11/04/22 0432 11/17/22 0114 12/10/22 1003  BILITOT 2.2* 2.6* 1.5* 1.4*  AST 38 35 52* 45*  ALT 15 13 21 24   ALKPHOS 73 65 122 129*  PROT 4.2* 4.0* 5.0* 4.8*  ALBUMIN 2.2* 2.1* 2.5* 2.3*    Assessment and Plan: 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  She underwent "emborrhoid" procedure on 02/07/22 which significantly improved her rectal bleeding  initially which gradually returned.  She underwent repeat angiogram and embolization of the duplicated bilateral superior rectal arteries on 09/05/22 due to persistent anemia requiring transfusions.  Again, she underwent repeat superior rectal embolization on 11/05/22 by my partner, Dr. Archer Asa.  Despite this third embolization of the superior rectal arteries, she continues to have rectal bleeding, exacerbated by large volume ascites.    She remains is a dire clinical position requiring weekly blood transfusions and paracenteses.  She is undergoing workup for transplant at [facility], but there are apparently several steps that still must be taken to get her on the list, which she states aren't happening soon.  I am concerned that if she ever is a candidate, she will be so deconditioned that she won't be able to tolerate this.    We discussed the possibility of partial splenic embolization to improve her hematologic profiles as well as decrease portal hypertension.  Additionally, we discussed the possibility of the more extreme idea of double barrel TIPS.  I will reach out to the [facility] IR team to see if any follow up is planned and discuss the possibility of [place] performing splenic embolization locally.   Electronically Signed: Bennie Dallas 12/12/2022, 8:17 AM   I spent a total of 25 Minutes in virtual telephone clinical consultation, greater than 50% of which was counseling/coordinating care for portal hypertension, chronic hemorrhoidal bleeding s/p emborrhoid.

## 2022-12-12 NOTE — Therapy (Signed)
OUTPATIENT PHYSICAL THERAPY LOWER EXTREMITY TREATMENT   Patient Name: LANYA BUCKS MRN: [id number] DOB:10-18-1955, 67 y.o., female Today's Date: 12/12/2022  END OF SESSION:  PT End of Session - 12/12/22 1537     Visit Number 4    Number of Visits 16    Date for PT Re-Evaluation 01/19/23    Authorization Type Medicare A/ [employer]ET[employer] CVA 2ndary    PT Start Time 1500    PT Stop Time 1528    PT Time Calculation (min) 28 min    Activity Tolerance Patient tolerated treatment well    Behavior During Therapy [facility] for tasks assessed/performed             Past Medical History:  Diagnosis Date   Anemia in chronic kidney disease (CKD) 02/07/2022   Anxiety    Arthritis    Cirrhosis of liver (HCC)    Diabetes mellitus without complication (HCC)    Dyspnea    DOE   GERD (gastroesophageal reflux disease)    Grade IV internal hemorrhoids    Contingency plan for any future admissions for severe anemia in the setting of persistent GI bleed:  nuclear medicine tagged RBC scan to assess for location of GI bleeding - if from rectum, would then proceed to angiogram with possible repeat embolization.  Please notify IR in this case.   Hepatitis    PAST   Hypertension    Neuropathy    Neuropathy, diabetic (HCC)    Pneumonia    Sinus complaint    Sleep apnea    CPAP   Thalassemia minor 1992   Past Surgical History:  Procedure Laterality Date   ABDOMINAL HYSTERECTOMY     BREAST BIOPSY Right 02/15/2018   [place] bx 6-6:30 ribbon shape, ONE CORE FRAGMENT WITH FIBROSIS. ONE CORE FRAGMENT WITH PORTION OF A DILATED   BREAST BIOPSY Right 02/15/2018   [place] bx 9:00 heart shape, USUAL DUCTAL HYPERPLASIA   BREAST LUMPECTOMY Right 03/09/2018   Procedure: BREAST LUMPECTOMY x 2;  Surgeon: Sung Amabile, DO;  Location: [facility] OR[facility];  Service: General;  Laterality: Right;   CATARACT EXTRACTION W/PHACO Left 06/11/2016   Procedure: CATARACT EXTRACTION PHACO AND INTRAOCULAR LENS PLACEMENT (IOC);  Surgeon: Sallee Lange, MD;  Location: [facility];  Service: Ophthalmology;  Laterality: Left;  Lot # [id number] H US:01:38.6 AP%:26.4 CDE:44.15   CATARACT EXTRACTION W/PHACO Right 06/15/2018   Procedure: CATARACT EXTRACTION PHACO AND INTRAOCULAR LENS PLACEMENT (IOC);  Surgeon: Galen Manila, MD;  Location: [facility];  Service: Ophthalmology;  Laterality: Right;  [place] 00:38.2 CDE 4.23 Fluid Pack Lot # [id number]   COLONOSCOPIES     COLONOSCOPY WITH PROPOFOL N/A 10/10/2020   Procedure: COLONOSCOPY WITH PROPOFOL;  Surgeon: Regis Bill, MD;  Location: [facility];  Service: Endoscopy;  Laterality: N/A;   COLONOSCOPY WITH PROPOFOL N/A 11/20/2020   Procedure: COLONOSCOPY WITH PROPOFOL;  Surgeon: Regis Bill, MD;  Location: [facility] ENDOSCOPY;  Service: Endoscopy;  Laterality: N/A;  DM STAT CBC, BMP COVID POSITIVE 09/02/2020   COLONOSCOPY WITH PROPOFOL N/A 06/19/2022   Procedure: COLONOSCOPY WITH PROPOFOL;  Surgeon: Regis Bill, MD;  Location: [facility] ENDOSCOPY;  Service: Endoscopy;  Laterality: N/A;   CTR     ESOPHAGOGASTRODUODENOSCOPY (EGD) WITH PROPOFOL N/A 10/10/2020   Procedure: ESOPHAGOGASTRODUODENOSCOPY (EGD) WITH PROPOFOL;  Surgeon: Regis Bill, MD;  Location: [facility] ENDOSCOPY;  Service: Endoscopy;  Laterality: N/A;  COVID POSITIVE 10/08/2020   FLEXIBLE SIGMOIDOSCOPY N/A 02/06/2022   Procedure: FLEXIBLE SIGMOIDOSCOPY;  Surgeon: Regis Bill, MD;  Location: [facility] ENDOSCOPY;  Service: Endoscopy;  Laterality: N/A;  Patient requests anesthesia   IR ANGIOGRAM SELECTIVE EACH ADDITIONAL VESSEL  02/07/2022   IR ANGIOGRAM SELECTIVE EACH ADDITIONAL VESSEL  02/07/2022   IR ANGIOGRAM SELECTIVE EACH ADDITIONAL VESSEL  09/05/2022   IR ANGIOGRAM VISCERAL SELECTIVE  02/07/2022   IR ANGIOGRAM VISCERAL SELECTIVE  09/05/2022   IR EMBO ART  VEN HEMORR LYMPH EXTRAV  INC GUIDE ROADMAPPING  09/05/2022   IR EMBO ART  VEN HEMORR LYMPH EXTRAV  INC GUIDE ROADMAPPING  11/05/2022   IR EMBO ARTERIAL NOT HEMORR HEMANG  INC GUIDE ROADMAPPING  02/07/2022   IR IMAGING GUIDED PORT INSERTION  06/13/2022   IR IMAGING GUIDED PORT INSERTION  11/25/2022   IR PARACENTESIS  12/17/2021   IR PARACENTESIS  03/25/2022   IR PARACENTESIS  11/25/2022   IR RADIOLOGIST EVAL & MGMT  03/18/2022   IR RADIOLOGIST EVAL & MGMT  05/07/2022   IR RADIOLOGIST EVAL & MGMT  05/16/2022   IR RADIOLOGIST EVAL & MGMT  10/13/2022   IR RADIOLOGIST EVAL & MGMT  12/12/2022   IR US GUIDE VASC ACCESS RIGHT  02/07/2022   IR US GUIDE VASC ACCESS RIGHT  09/05/2022   JOINT REPLACEMENT     KNEE SURGERY Right 09/24/2017   plates and pins   PORT-A-CATH REMOVAL N/A 09/23/2022   Procedure: MINOR REMOVAL PORT-A-CATH;  Surgeon: Franky Macho, MD;  Location: [facility];  Service: General;  Laterality: N/A;   TEE WITHOUT CARDIOVERSION N/A 09/23/2022   Procedure: TRANSESOPHAGEAL ECHOCARDIOGRAM (TEE);  Surgeon: Antoine Poche, MD;  Location: [facility];  Service: Endoscopy;  Laterality: N/A;   TOTAL SHOULDER ARTHROPLASTY Right 09/27/2015   Patient Active Problem List   Diagnosis Date Noted   Hypokalemia 11/06/2022   BRBPR (bright red blood per rectum) 11/03/2022   Acute on chronic anemia 11/03/2022   Hyperkalemia 11/03/2022   Hypotension 11/03/2022   Acute renal failure (ARF) (HCC) 10/16/2022   AKI (acute kidney injury) (HCC) 10/15/2022   Bacteremia 09/21/2022   SIRS (systemic inflammatory response syndrome) (HCC) 09/18/2022   Sepsis (HCC) 09/18/2022   Rectal bleeding 06/17/2022   Obesity (BMI 30-39.9) 06/17/2022   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 mellitus (HCC) 06/17/2022   Grade IV internal hemorrhoids 05/16/2022   Acute on chronic blood loss anemia 03/05/2022   Thrombocytopenia (HCC) 03/05/2022   Fever 02/08/2022   Anemia in chronic kidney disease (CKD) 02/07/2022   GI bleeding 02/04/2022   Chronic kidney disease, stage 3b (HCC) 02/04/2022   Depression with anxiety 02/04/2022   Liver cirrhosis secondary to NASH (HCC)    Insomnia 01/27/2022   Iron deficiency  anemia due to chronic blood loss 02/13/2021   History of uterine cancer 02/13/2021   Decompensated hepatic cirrhosis (HCC) 10/25/2020   OSA on CPAP 10/25/2020   Glossitis 10/25/2020   Hyponatremia 10/08/2020   Acute respiratory failure with hypoxia (HCC) 09/10/2020   Pneumonia due to COVID-19 virus 09/08/2020   Hip fracture, right (HCC) 09/04/2020   Closed right hip fracture (HCC) 09/02/2020   Type 2 diabetes mellitus with hyperlipidemia (HCC) 09/02/2020   Thalassemia minor    HLD (hyperlipidemia)    Acute kidney injury superimposed on CKD (HCC)    Depression    Nondisplaced fracture of greater trochanter of right femur, initial encounter for closed fracture (HCC)     PCP: Einar Crow MD  REFERRING PROVIDER: Kristine Linea, NP  REFERRING DIAG: R26.9 (ICD-10-CM) - Unspecified abnormalities of gait and mobility  THERAPY DIAG:  Muscle weakness (generalized)  Other abnormalities of gait and mobility  Difficulty in walking, not elsewhere classified  Rationale for Evaluation and Treatment: Rehabilitation  ONSET DATE: Chronic  SUBJECTIVE:   SUBJECTIVE STATEMENT: Pt late for apt today, stated road work held her up.  Arrived with new rollator, stated she got on Friday.  Reports she has increased swelling on both her legs.  EVAL: Patient presents to therapy with complaint of LE weakness and gait difficulty. She is previously known to this clinic. She says she is doing better than before but she was in the hospital for the better part of February due to altered labs and subsequent infection. She is now slated to have kidney and liver transplants coming up but does not yet have a set date.   PERTINENT HISTORY: Falls, weakness, DM, R shoulder THA Feb 2019 R knee pain (OA)  Cirrhosis   PAIN:  Are you having pain? no  PRECAUTIONS: Fall  WEIGHT BEARING RESTRICTIONS: No  FALLS:  Has patient fallen in last 6 months? No  LIVING ENVIRONMENT: Lives with: lives  with their family and lives with their spouse Lives in: House/apartment Stairs: No Has following equipment at home: Single point cane and Environmental consultant - 2 wheeled  OCCUPATION: Retired/ Disability     PLOF: Needs assistance with ADLs  PATIENT GOALS: To get stronger for surgery   NEXT MD VISIT: 01/21/23  OBJECTIVE:   DIAGNOSTIC FINDINGS: NA  COGNITION: Overall cognitive status: Within functional limits for tasks assessed     SENSATION: Not tested  LOWER EXTREMITY MMT: (unable to lay prone)   MMT Right eval Left eval  Hip flexion 4 4  Hip extension    Hip abduction 4- 4  Hip adduction    Hip internal rotation    Hip external rotation    Knee flexion    Knee extension 4 4  Ankle dorsiflexion 4+ 4  Ankle plantarflexion    Ankle inversion    Ankle eversion     (Blank rows = not tested)   FUNCTIONAL TESTS:  5 times sit to stand: 21 sec no UE  2 minute walk test: 60 feet in 1 minute using cane   GAIT:  Decreased stride, trunk flexed, slow speed, using RW  TODAY'S TREATMENT:                                                                                                                              DATE:  12/12/22: 6in step up 10x each with HHA Semi tandem stance 2 x 30"  [place] GTB 10x 2 sets NBOS Alternating marching 6in step height 10x each 2 finger assistance Sit to stand 2 sets 5 reps; 2nd set 5STS 27.89  During seated rest break discussed importance of compression garments Measured for ETI thigh high compression garments   12/01/22 Nu step 4 min lv 3 seat 12 Heel raise/ toe raise 2 x 10 each  Sit to  stand x10 Semi tandem stance 2 x 30"  4 inch step up x 10 each   11/28/2022 Gastrocnemius slant board stretch x 30" x 3 Sit-to-stand training, no UE, slightly elevated seat (emphasis on scooting, heel placement backwards, and trunk momentum) x 10  Heel/toe raises x 20 reps Tandem stance, firm surface, eyes open x 30" x 2 Mini squats x 3" x 10 x 2, little to  no UE support Hip vectors, RTB x 5 x 4 on each  11/24/22 Eval    PATIENT EDUCATION:  Education details: on Eval findings, POC and HEP Person educated: Patient Education method: Explanation Education comprehension: verbalized understanding  HOME EXERCISE PROGRAM: Access Code: [email or web address] URL: [email or web address] Date: 11/24/2022 Prepared by: Georges Lynch  Exercises - Seated Heel Toe Raises  - 2 x daily - 7 x weekly - 1 sets - 10 reps - Seated Long Arc Quad  - 2 x daily - 7 x weekly - 1 sets - 10 reps - Seated March  - 2 x daily - 7 x weekly - 1 sets - 10 reps - Supine Active Straight Leg Raise  - 7 x weekly - 1 sets - 10 reps - Heel Raises with Counter Support  - 2 x daily - 7 x weekly - 1 sets - 10 reps - 3-5" hold - Mini Squat with Counter Support  - 2 x daily - 7 x weekly - 1 sets - 10 reps - 3-5" hold - Sit to Stand with Counter Support  - 2 x daily - 7 x weekly - 1 sets - 10 reps  ASSESSMENT:  CLINICAL IMPRESSION: Session limited due to late arrival, discussed importance of leaving earlier for whole session length.  Session focus with functional strengthening and balance training.  Pt required intermittent HHA and cueing for core stability with tasks and posture to assist with balance.  During rest break pt c/o increased swelling.  Reviewed importance of compression garments to assist with edema control.  Measurements complete and give handout of ETI.   Noted significant dry skin.  Encouraged lotion for skin integrity and wearing compression garments every day to reduce risk of wounds.   EVAL: Patient is a 67 y.o. female who presents to physical therapy with complaint of LE weakness and altered gait. Patient demonstrates decreased strength, balance deficits and gait abnormalities which are negatively impacting patient ability to perform ADLs and functional mobility tasks. Patient will benefit from skilled physical therapy services to address these deficits to  improve level of function with ADLs, functional mobility tasks, and reduce risk for falls.    OBJECTIVE IMPAIRMENTS: Abnormal gait, decreased activity tolerance, decreased balance, decreased endurance, decreased mobility, difficulty walking, decreased strength, dizziness, hypomobility, and improper body mechanics.   ACTIVITY LIMITATIONS: carrying, lifting, bending, sitting, standing, squatting, stairs, transfers, bed mobility, and locomotion level  PARTICIPATION LIMITATIONS: meal prep, cleaning, laundry, driving, shopping, community activity, and yard work  PERSONAL FACTORS: Past/current experiences, Time since onset of injury/illness/exacerbation, and 3+ comorbidities: See history  are also affecting patient's functional outcome.   REHAB POTENTIAL: Fair See above  CLINICAL DECISION MAKING: Stable/uncomplicated  EVALUATION COMPLEXITY: Low   GOALS: SHORT TERM GOALS: Target date: 12/22/2022  Patient will be independent with initial HEP and self-management strategies to improve functional outcomes Baseline:  Goal status: INITIAL   2.  Patient will be able to perform stand x 5 in < 20 seconds to demonstrate improvement in functional mobility and reduced risk for falls.  Baseline:  Goal status: INITIAL  LONG TERM GOALS: Target date: 01/19/2023  Patient will be independent with advanced HEP and self-management strategies to improve functional outcomes Baseline:  Goal status: INITIAL  2.  Patient will be able to ambulate at least 250 feet during with LRAD to demonstrate improved ability to perform functional mobility and associated tasks. Baseline:60 feet in 1 minute using cane  Goal status: INITIAL  3.  Patient will be able to perform stand x 5 in < 15 seconds to demonstrate improvement in functional mobility and reduced risk for falls.  Baseline:  Goal status: INITIAL  4. Patient will have equal to or > 4+/5 MMT throughout tested BLE to improve ability to perform functional  mobility, stair ambulation and ADLs.  Baseline: See MMT Goal status: INITIAL  PLAN:  PT FREQUENCY: 2x/week  PT DURATION: 8 weeks  PLANNED INTERVENTIONS: Therapeutic exercises, Therapeutic activity, Neuromuscular re-education, Balance training, Gait training, Patient/Family education, Joint manipulation, Joint mobilization, Stair training, Aquatic Therapy, Dry Needling, Electrical stimulation, Spinal manipulation, Spinal mobilization, Cryotherapy, Moist heat, scar mobilization, Taping, Traction, Ultrasound, Biofeedback, Ionotophoresis 4mg /ml Dexamethasone, and Manual therapy.   PLAN FOR NEXT SESSION: Progress LE and core strength as tolerated. Gait and balance progressions as able.   Becky Sax, LPTA/CLT; CBIS [phone]  3:40 PM, 12/12/22

## 2022-12-15 ENCOUNTER — Encounter (HOSPITAL_COMMUNITY): Payer: 59 | Admitting: Physical Therapy

## 2022-12-15 DIAGNOSIS — L97522 Non-pressure chronic ulcer of other part of left foot with fat layer exposed: Secondary | ICD-10-CM | POA: Diagnosis not present

## 2022-12-15 DIAGNOSIS — M2042 Other hammer toe(s) (acquired), left foot: Secondary | ICD-10-CM | POA: Diagnosis not present

## 2022-12-15 DIAGNOSIS — M2041 Other hammer toe(s) (acquired), right foot: Secondary | ICD-10-CM | POA: Diagnosis not present

## 2022-12-15 DIAGNOSIS — Q6672 Congenital pes cavus, left foot: Secondary | ICD-10-CM | POA: Diagnosis not present

## 2022-12-15 DIAGNOSIS — M216X2 Other acquired deformities of left foot: Secondary | ICD-10-CM | POA: Diagnosis not present

## 2022-12-15 DIAGNOSIS — L851 Acquired keratosis [keratoderma] palmaris et plantaris: Secondary | ICD-10-CM | POA: Diagnosis not present

## 2022-12-15 DIAGNOSIS — Q6671 Congenital pes cavus, right foot: Secondary | ICD-10-CM | POA: Diagnosis not present

## 2022-12-15 DIAGNOSIS — M216X1 Other acquired deformities of right foot: Secondary | ICD-10-CM | POA: Diagnosis not present

## 2022-12-15 DIAGNOSIS — B351 Tinea unguium: Secondary | ICD-10-CM | POA: Diagnosis not present

## 2022-12-15 DIAGNOSIS — E1142 Type 2 diabetes mellitus with diabetic polyneuropathy: Secondary | ICD-10-CM | POA: Diagnosis not present

## 2022-12-15 DIAGNOSIS — L84 Corns and callosities: Secondary | ICD-10-CM | POA: Diagnosis not present

## 2022-12-16 ENCOUNTER — Ambulatory Visit
Admission: RE | Admit: 2022-12-16 | Discharge: 2022-12-16 | Disposition: A | Discharge status: Home / Self Care | Payer: Medicare Other | Source: Ambulatory Visit | Attending: Gastroenterology | Admitting: Gastroenterology

## 2022-12-16 VITALS — BP 109/41 | HR 72 | Temp 98.1°F | Resp 18

## 2022-12-16 DIAGNOSIS — K7581 Nonalcoholic steatohepatitis (NASH): Secondary | ICD-10-CM | POA: Diagnosis not present

## 2022-12-16 DIAGNOSIS — E1122 Type 2 diabetes mellitus with diabetic chronic kidney disease: Secondary | ICD-10-CM | POA: Diagnosis not present

## 2022-12-16 DIAGNOSIS — R188 Other ascites: Secondary | ICD-10-CM | POA: Diagnosis not present

## 2022-12-16 DIAGNOSIS — N183 Chronic kidney disease, stage 3 unspecified: Secondary | ICD-10-CM | POA: Insufficient documentation

## 2022-12-16 DIAGNOSIS — K7460 Unspecified cirrhosis of liver: Secondary | ICD-10-CM | POA: Diagnosis not present

## 2022-12-16 DIAGNOSIS — K7469 Other cirrhosis of liver: Secondary | ICD-10-CM | POA: Diagnosis not present

## 2022-12-16 LAB — LIPID PANEL
Cholesterol: 75 mg/dL (ref 0–200)
HDL: 21 mg/dL — ABNORMAL LOW (ref >40)
LDL Cholesterol: 42 mg/dL (ref 0–99)
Total CHOL/HDL Ratio: 3.6 RATIO
Triglycerides: 61 mg/dL (ref <150)
VLDL: 12 mg/dL (ref 0–40)

## 2022-12-16 LAB — METHYLMALONIC ACID, SERUM: Methylmalonic Acid, Quantitative: 535 nmol/L — ABNORMAL HIGH (ref 0–378)

## 2022-12-16 MED ORDER — ALBUMIN HUMAN 25 % IV SOLN
25.0000 g | Freq: Once | INTRAVENOUS | Status: AC
  Administered 2022-12-16: 25 g via INTRAVENOUS

## 2022-12-16 MED ORDER — ALBUMIN HUMAN 25 % IV SOLN
INTRAVENOUS | Status: AC
  Filled 2022-12-16: qty 100

## 2022-12-16 MED ORDER — HEPARIN SOD (PORK) LOCK FLUSH 100 UNIT/ML IV SOLN
INTRAVENOUS | Status: AC
  Filled 2022-12-16: qty 5

## 2022-12-16 MED ORDER — LIDOCAINE HCL (PF) 1 % IJ SOLN
10.0000 mL | Freq: Once | INTRAMUSCULAR | Status: AC
  Administered 2022-12-16: 10 mL via SUBCUTANEOUS
  Filled 2022-12-16: qty 10

## 2022-12-16 NOTE — Procedures (Signed)
Pre Procedural Dx: Symptomatic Ascites Post Procedural Dx: Same  Successful US guided paracentesis yielding 8 L of serous ascitic fluid.  EBL: None Complications: None immediate  Jay Lolah Coghlan, MD Pager #: [phone]   

## 2022-12-17 ENCOUNTER — Encounter (HOSPITAL_COMMUNITY): Payer: Self-pay

## 2022-12-17 ENCOUNTER — Inpatient Hospital Stay (HOSPITAL_COMMUNITY)
Admission: RE | Admit: 2022-12-17 | Discharge: 2022-12-20 | DRG: 442 | Disposition: A | Discharge status: Home / Self Care | Payer: Medicare Other | Source: Ambulatory Visit | Attending: Internal Medicine | Admitting: Internal Medicine

## 2022-12-17 ENCOUNTER — Other Ambulatory Visit: Payer: Self-pay

## 2022-12-17 ENCOUNTER — Inpatient Hospital Stay: Payer: Medicare Other | Attending: Hematology

## 2022-12-17 ENCOUNTER — Encounter (HOSPITAL_COMMUNITY): Payer: Self-pay | Admitting: Family Medicine

## 2022-12-17 ENCOUNTER — Inpatient Hospital Stay (HOSPITAL_BASED_OUTPATIENT_CLINIC_OR_DEPARTMENT_OTHER): Payer: Medicare Other | Admitting: Physician Assistant

## 2022-12-17 VITALS — BP 113/37 | HR 70 | Temp 97.7°F | Resp 16

## 2022-12-17 DIAGNOSIS — Z803 Family history of malignant neoplasm of breast: Secondary | ICD-10-CM

## 2022-12-17 DIAGNOSIS — I129 Hypertensive chronic kidney disease with stage 1 through stage 4 chronic kidney disease, or unspecified chronic kidney disease: Secondary | ICD-10-CM | POA: Insufficient documentation

## 2022-12-17 DIAGNOSIS — F419 Anxiety disorder, unspecified: Secondary | ICD-10-CM | POA: Diagnosis present

## 2022-12-17 DIAGNOSIS — K625 Hemorrhage of anus and rectum: Secondary | ICD-10-CM | POA: Diagnosis present

## 2022-12-17 DIAGNOSIS — E1122 Type 2 diabetes mellitus with diabetic chronic kidney disease: Secondary | ICD-10-CM | POA: Diagnosis present

## 2022-12-17 DIAGNOSIS — D649 Anemia, unspecified: Secondary | ICD-10-CM | POA: Diagnosis present

## 2022-12-17 DIAGNOSIS — K922 Gastrointestinal hemorrhage, unspecified: Secondary | ICD-10-CM | POA: Diagnosis present

## 2022-12-17 DIAGNOSIS — Z8616 Personal history of COVID-19: Secondary | ICD-10-CM | POA: Diagnosis not present

## 2022-12-17 DIAGNOSIS — E538 Deficiency of other specified B group vitamins: Secondary | ICD-10-CM | POA: Insufficient documentation

## 2022-12-17 DIAGNOSIS — R188 Other ascites: Secondary | ICD-10-CM | POA: Diagnosis not present

## 2022-12-17 DIAGNOSIS — K643 Fourth degree hemorrhoids: Secondary | ICD-10-CM | POA: Diagnosis not present

## 2022-12-17 DIAGNOSIS — N189 Chronic kidney disease, unspecified: Secondary | ICD-10-CM | POA: Diagnosis not present

## 2022-12-17 DIAGNOSIS — N1832 Chronic kidney disease, stage 3b: Secondary | ICD-10-CM

## 2022-12-17 DIAGNOSIS — N184 Chronic kidney disease, stage 4 (severe): Secondary | ICD-10-CM | POA: Diagnosis present

## 2022-12-17 DIAGNOSIS — D61818 Other pancytopenia: Secondary | ICD-10-CM

## 2022-12-17 DIAGNOSIS — K7460 Unspecified cirrhosis of liver: Secondary | ICD-10-CM | POA: Insufficient documentation

## 2022-12-17 DIAGNOSIS — Z91040 Latex allergy status: Secondary | ICD-10-CM

## 2022-12-17 DIAGNOSIS — G4730 Sleep apnea, unspecified: Secondary | ICD-10-CM | POA: Diagnosis present

## 2022-12-17 DIAGNOSIS — Z8051 Family history of malignant neoplasm of kidney: Secondary | ICD-10-CM | POA: Insufficient documentation

## 2022-12-17 DIAGNOSIS — Z86010 Personal history of colonic polyps: Secondary | ICD-10-CM

## 2022-12-17 DIAGNOSIS — Z807 Family history of other malignant neoplasms of lymphoid, hematopoietic and related tissues: Secondary | ICD-10-CM | POA: Insufficient documentation

## 2022-12-17 DIAGNOSIS — K766 Portal hypertension: Secondary | ICD-10-CM | POA: Diagnosis not present

## 2022-12-17 DIAGNOSIS — F32A Depression, unspecified: Secondary | ICD-10-CM | POA: Diagnosis present

## 2022-12-17 DIAGNOSIS — D500 Iron deficiency anemia secondary to blood loss (chronic): Secondary | ICD-10-CM | POA: Diagnosis present

## 2022-12-17 DIAGNOSIS — D6959 Other secondary thrombocytopenia: Secondary | ICD-10-CM | POA: Diagnosis not present

## 2022-12-17 DIAGNOSIS — F418 Other specified anxiety disorders: Secondary | ICD-10-CM | POA: Diagnosis present

## 2022-12-17 DIAGNOSIS — K219 Gastro-esophageal reflux disease without esophagitis: Secondary | ICD-10-CM | POA: Diagnosis present

## 2022-12-17 DIAGNOSIS — Z9842 Cataract extraction status, left eye: Secondary | ICD-10-CM

## 2022-12-17 DIAGNOSIS — K648 Other hemorrhoids: Secondary | ICD-10-CM | POA: Diagnosis not present

## 2022-12-17 DIAGNOSIS — D561 Beta thalassemia: Secondary | ICD-10-CM | POA: Insufficient documentation

## 2022-12-17 DIAGNOSIS — K7581 Nonalcoholic steatohepatitis (NASH): Secondary | ICD-10-CM | POA: Diagnosis present

## 2022-12-17 DIAGNOSIS — K644 Residual hemorrhoidal skin tags: Secondary | ICD-10-CM | POA: Diagnosis present

## 2022-12-17 DIAGNOSIS — Z8249 Family history of ischemic heart disease and other diseases of the circulatory system: Secondary | ICD-10-CM

## 2022-12-17 DIAGNOSIS — E669 Obesity, unspecified: Secondary | ICD-10-CM | POA: Diagnosis not present

## 2022-12-17 DIAGNOSIS — E1169 Type 2 diabetes mellitus with other specified complication: Secondary | ICD-10-CM | POA: Diagnosis not present

## 2022-12-17 DIAGNOSIS — D696 Thrombocytopenia, unspecified: Secondary | ICD-10-CM | POA: Insufficient documentation

## 2022-12-17 DIAGNOSIS — G4700 Insomnia, unspecified: Secondary | ICD-10-CM | POA: Diagnosis present

## 2022-12-17 DIAGNOSIS — D631 Anemia in chronic kidney disease: Secondary | ICD-10-CM | POA: Diagnosis present

## 2022-12-17 DIAGNOSIS — D62 Acute posthemorrhagic anemia: Secondary | ICD-10-CM | POA: Diagnosis not present

## 2022-12-17 DIAGNOSIS — Z79899 Other long term (current) drug therapy: Secondary | ICD-10-CM

## 2022-12-17 DIAGNOSIS — Z6839 Body mass index (BMI) 39.0-39.9, adult: Secondary | ICD-10-CM

## 2022-12-17 DIAGNOSIS — E1140 Type 2 diabetes mellitus with diabetic neuropathy, unspecified: Secondary | ICD-10-CM | POA: Diagnosis present

## 2022-12-17 DIAGNOSIS — E559 Vitamin D deficiency, unspecified: Secondary | ICD-10-CM | POA: Insufficient documentation

## 2022-12-17 DIAGNOSIS — K7290 Hepatic failure, unspecified without coma: Secondary | ICD-10-CM | POA: Diagnosis present

## 2022-12-17 DIAGNOSIS — Z833 Family history of diabetes mellitus: Secondary | ICD-10-CM

## 2022-12-17 DIAGNOSIS — Z90710 Acquired absence of both cervix and uterus: Secondary | ICD-10-CM

## 2022-12-17 DIAGNOSIS — Z961 Presence of intraocular lens: Secondary | ICD-10-CM | POA: Diagnosis present

## 2022-12-17 DIAGNOSIS — Z90722 Acquired absence of ovaries, bilateral: Secondary | ICD-10-CM | POA: Insufficient documentation

## 2022-12-17 DIAGNOSIS — G4733 Obstructive sleep apnea (adult) (pediatric): Secondary | ICD-10-CM

## 2022-12-17 DIAGNOSIS — Z8542 Personal history of malignant neoplasm of other parts of uterus: Secondary | ICD-10-CM

## 2022-12-17 DIAGNOSIS — I959 Hypotension, unspecified: Secondary | ICD-10-CM | POA: Diagnosis present

## 2022-12-17 DIAGNOSIS — Z9841 Cataract extraction status, right eye: Secondary | ICD-10-CM

## 2022-12-17 DIAGNOSIS — D563 Thalassemia minor: Secondary | ICD-10-CM | POA: Diagnosis not present

## 2022-12-17 DIAGNOSIS — Z79890 Hormone replacement therapy (postmenopausal): Secondary | ICD-10-CM

## 2022-12-17 DIAGNOSIS — E785 Hyperlipidemia, unspecified: Secondary | ICD-10-CM | POA: Diagnosis present

## 2022-12-17 DIAGNOSIS — Z96611 Presence of right artificial shoulder joint: Secondary | ICD-10-CM | POA: Diagnosis present

## 2022-12-17 HISTORY — DX: Other ascites: R18.8

## 2022-12-17 HISTORY — DX: Portal hypertension: K76.6

## 2022-12-17 HISTORY — DX: Obstructive sleep apnea (adult) (pediatric): G47.33

## 2022-12-17 HISTORY — DX: Nonalcoholic steatohepatitis (NASH): K75.81

## 2022-12-17 HISTORY — DX: Presence of other vascular implants and grafts: Z95.828

## 2022-12-17 HISTORY — DX: Chronic kidney disease, stage 4 (severe): N18.4

## 2022-12-17 HISTORY — DX: Obesity, unspecified: E66.9

## 2022-12-17 LAB — BPAM RBC
Blood Product Expiration Date: 202405242359
ISSUE DATE / TIME: 202405012005
Unit Type and Rh: 6200

## 2022-12-17 LAB — CBC
HCT: 15.6 % — ABNORMAL LOW (ref 36.0–46.0)
Hemoglobin: 4.9 g/dL — CL (ref 12.0–15.0)
MCH: 25.1 pg — ABNORMAL LOW (ref 26.0–34.0)
MCHC: 31.4 g/dL (ref 30.0–36.0)
MCV: 80 fL (ref 80.0–100.0)
Platelets: 119 10*3/uL — ABNORMAL LOW (ref 150–400)
RBC: 1.95 MIL/uL — ABNORMAL LOW (ref 3.87–5.11)
RDW: 19.9 % — ABNORMAL HIGH (ref 11.5–15.5)
WBC: 2.4 10*3/uL — ABNORMAL LOW (ref 4.0–10.5)
nRBC: 0 % (ref 0.0–0.2)

## 2022-12-17 LAB — BASIC METABOLIC PANEL
Anion gap: 5 (ref 5–15)
BUN: 38 mg/dL — ABNORMAL HIGH (ref 8–23)
CO2: 20 mmol/L — ABNORMAL LOW (ref 22–32)
Calcium: 7.9 mg/dL — ABNORMAL LOW (ref 8.9–10.3)
Chloride: 109 mmol/L (ref 98–111)
Creatinine, Ser: 1.7 mg/dL — ABNORMAL HIGH (ref 0.44–1.00)
GFR, Estimated: 33 mL/min — ABNORMAL LOW (ref >60)
Glucose, Bld: 182 mg/dL — ABNORMAL HIGH (ref 70–99)
Potassium: 4.1 mmol/L (ref 3.5–5.1)
Sodium: 134 mmol/L — ABNORMAL LOW (ref 135–145)

## 2022-12-17 LAB — TYPE AND SCREEN
Unit division: 0
Unit division: 0

## 2022-12-17 LAB — GLUCOSE, CAPILLARY
Glucose-Capillary: 152 mg/dL — ABNORMAL HIGH (ref 70–99)
Glucose-Capillary: 127 mg/dL — ABNORMAL HIGH (ref 70–99)
Glucose-Capillary: 128 mg/dL — ABNORMAL HIGH (ref 70–99)

## 2022-12-17 LAB — MRSA NEXT GEN BY PCR, NASAL: MRSA by PCR Next Gen: NOT DETECTED

## 2022-12-17 LAB — PREPARE RBC (CROSSMATCH)

## 2022-12-17 MED ORDER — MELATONIN 5 MG PO TABS: 10.0000 mg | ORAL_TABLET | Freq: Every evening | ORAL | Status: DC | PRN

## 2022-12-17 MED ORDER — FUROSEMIDE 10 MG/ML IJ SOLN
20.0000 mg | Freq: Once | INTRAMUSCULAR | Status: AC
  Administered 2022-12-17: 20 mg via INTRAVENOUS
  Filled 2022-12-17: qty 2

## 2022-12-17 MED ORDER — MIDODRINE HCL 5 MG PO TABS
10.0000 mg | ORAL_TABLET | Freq: Three times a day (TID) | ORAL | Status: DC
  Administered 2022-12-17 – 2022-12-20 (×9): 10 mg via ORAL
  Filled 2022-12-17 (×9): qty 2

## 2022-12-17 MED ORDER — SPIRONOLACTONE 25 MG PO TABS
25.0000 mg | ORAL_TABLET | Freq: Every day | ORAL | Status: DC
  Administered 2022-12-17 – 2022-12-20 (×4): 25 mg via ORAL
  Filled 2022-12-17 (×4): qty 1

## 2022-12-17 MED ORDER — SERTRALINE HCL 50 MG PO TABS
50.0000 mg | ORAL_TABLET | Freq: Every day | ORAL | Status: DC
  Administered 2022-12-17 – 2022-12-20 (×4): 50 mg via ORAL
  Filled 2022-12-17 (×4): qty 1

## 2022-12-17 MED ORDER — POLYETHYLENE GLYCOL 3350 17 G PO PACK
17.0000 g | PACK | Freq: Two times a day (BID) | ORAL | Status: DC
  Administered 2022-12-17 – 2022-12-19 (×3): 17 g via ORAL
  Filled 2022-12-17 (×5): qty 1

## 2022-12-17 MED ORDER — OXYMETAZOLINE HCL 0.05 % NA SOLN: 1.0000 | Freq: Every day | NASAL | Status: DC | PRN

## 2022-12-17 MED ORDER — CIPROFLOXACIN HCL 250 MG PO TABS
500.0000 mg | ORAL_TABLET | Freq: Every day | ORAL | Status: DC
  Administered 2022-12-18: 500 mg via ORAL
  Filled 2022-12-17: qty 2

## 2022-12-17 MED ORDER — ALBUTEROL SULFATE (2.5 MG/3ML) 0.083% IN NEBU: 2.5000 mg | INHALATION_SOLUTION | Freq: Four times a day (QID) | RESPIRATORY_TRACT | Status: DC | PRN

## 2022-12-17 MED ORDER — ATORVASTATIN CALCIUM 10 MG PO TABS
10.0000 mg | ORAL_TABLET | Freq: Every evening | ORAL | Status: DC
  Administered 2022-12-17 – 2022-12-19 (×3): 10 mg via ORAL
  Filled 2022-12-17 (×3): qty 1

## 2022-12-17 MED ORDER — GABAPENTIN 400 MG PO CAPS
400.0000 mg | ORAL_CAPSULE | Freq: Two times a day (BID) | ORAL | Status: DC
  Administered 2022-12-17 – 2022-12-20 (×6): 400 mg via ORAL
  Filled 2022-12-17 (×6): qty 1

## 2022-12-17 MED ORDER — VITAMIN B-12 1000 MCG PO TABS
1000.0000 ug | ORAL_TABLET | Freq: Every day | ORAL | Status: DC
  Administered 2022-12-17 – 2022-12-20 (×4): 1000 ug via ORAL
  Filled 2022-12-17 (×4): qty 1

## 2022-12-17 MED ORDER — INSULIN ASPART 100 UNIT/ML IJ SOLN
0.0000 [IU] | Freq: Three times a day (TID) | INTRAMUSCULAR | Status: DC
  Administered 2022-12-17 – 2022-12-18 (×2): 1 [IU] via SUBCUTANEOUS
  Administered 2022-12-18 (×2): 2 [IU] via SUBCUTANEOUS
  Administered 2022-12-19: 1 [IU] via SUBCUTANEOUS
  Administered 2022-12-20: 2 [IU] via SUBCUTANEOUS

## 2022-12-17 MED ORDER — SODIUM CHLORIDE 0.9% IV SOLUTION: Freq: Once | INTRAVENOUS | Status: DC

## 2022-12-17 MED ORDER — DIPHENHYDRAMINE HCL 25 MG PO CAPS: 25.0000 mg | ORAL_CAPSULE | Freq: Two times a day (BID) | ORAL | Status: DC | PRN

## 2022-12-17 MED ORDER — ONDANSETRON HCL 4 MG PO TABS: 4.0000 mg | ORAL_TABLET | Freq: Four times a day (QID) | ORAL | Status: DC | PRN

## 2022-12-17 MED ORDER — ONDANSETRON HCL 4 MG/2ML IJ SOLN
4.0000 mg | Freq: Four times a day (QID) | INTRAMUSCULAR | Status: DC | PRN
  Administered 2022-12-18: 4 mg via INTRAVENOUS
  Filled 2022-12-17: qty 2

## 2022-12-17 MED ORDER — VITAMIN B-12 1000 MCG PO TABS
1000.0000 ug | ORAL_TABLET | Freq: Every day | ORAL | 3 refills | Status: DC
Start: 2022-12-17 — End: 2023-01-19

## 2022-12-17 MED ORDER — TORSEMIDE 20 MG PO TABS
40.0000 mg | ORAL_TABLET | Freq: Every day | ORAL | Status: DC
  Administered 2022-12-18 – 2022-12-20 (×3): 40 mg via ORAL
  Filled 2022-12-17 (×3): qty 2

## 2022-12-17 MED ORDER — ACETAMINOPHEN 325 MG PO TABS
650.0000 mg | ORAL_TABLET | Freq: Four times a day (QID) | ORAL | Status: DC | PRN
  Administered 2022-12-17 – 2022-12-19 (×3): 650 mg via ORAL
  Filled 2022-12-17 (×3): qty 2

## 2022-12-17 NOTE — Progress Notes (Addendum)
[facility] [address], [place] [address]   CLINIC:  Medical Oncology/Hematology  PCP:  Lauro Regulus, MD [address] [facility] [place] [address] [phone]  DIRECT ADMIT TO HOSPITAL: Due to Hgb 4.9 and ongoing severe rectal bleeding, recommend direct admission to hospital.  Patient accepted by Dr. Standley Dakins for hospitalist admission.  Patient's interventional radiologist and gastroenterologist at [facility] have been notified.   REASON FOR VISIT:  Follow-up for iron deficiency anemia and thalassemia    CURRENT THERAPY: Iron infusions and blood transfusions as needed  INTERVAL HISTORY:   [name] 67 y.o. 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  She was last seen by Rojelio Brenner PA-C on 10/15/2022.   At today's visit, she reports feeling fair.  Since her last visit, she was hospitalized twice: 10/15/2022 to 10/25/2022: AKI on CKD stage IIIb, having been found to have serum creatinine 4.31 but improved to 2.32 on day of discharge 11/03/2022 to 11/06/2022: Acute blood loss anemia with Hgb 3.6 on admission to hospital, s/p 3 units PRBC.  She had intervention with angiogram and embolization of distal recanalized branch of superior rectal artery on 11/05/2022. She continues to receive regular paracentesis (last on 12/16/2022) and frequent blood transfusions.  She had TIPS revision at [facility] on 08/20/2022.  She follows closely with Dr. Elby Showers (interventional radiology), Dr. Mia Creek (gastroenterology), and [facility] liver transplant team.  Patient states that she has been bleeding "pretty heavy" for the past week, and also notes that her hemorrhoids have been painful when she has a bowel movement.  She has moderate to severe fatigue, bilateral lower extremity edema, chronic headaches, and lightheadedness without syncope.  She  continues to have orthopnea and intermittent dyspnea on exertion, particularly when she is due for paracentesis.  She continues to take vitamin D 50,000 units weekly and daily vitamin B12 supplements.  She has 50% energy and 70% appetite. She endorses that she is maintaining a stable weight.  ASSESSMENT & PLAN:  1.  Severe multifactorial anemia - ETIOLOGY OF ANEMIA: Chronic GI blood loss + beta thalassemia intermedia + CKD.  She may also have some underlying degree of hemolysis from her beta thalassemia intermedia. - Colonoscopies in February 2022 & April 2022 showed grade III/IV nonbleeding internal hemorrhoids, polyps, and area of significantly congested mucosa - EGD in February 2022 & April 2022 showed portal hypertensive gastropathy and erythematous duodenopathy - She has had frequent hospitalizations and blood transfusions over the past year, with notable events recorded below:  Flexible sigmoidoscopy on 02/06/2022 showed prolapsed hemorrhoids and internal hemorrhoids.  Interventional radiology performed bilateral superior rectal artery particle and coil embolization on 02/07/2022. Most recent coil embolization of rectal arteries on 11/05/2022 - During hospitalizations, patient has developed recurrent AKI's with evolving CKD - Trial of ESA in January 2024 for treatment of her CKD associated anemia: Single dose of Retacrit 10,000 units on 09/17/2022, on the same day that she had to be admitted to the hospital for sepsis.  Patient concerned that Retacrit was the reason for her hospital admission in. We have discussed that while fever can be a side effect of Retacrit injection in some patients, her fever was likely due to sepsis from bacteremia.  She remains hesitant to try any ESA injections in the future.   - Most recent hematology panel (12/10/2022): Ferritin 636, iron saturation 31% Creatinine 1.44/GFR  40 Normal copper, folate, and SPEP on previous labs. - CBC today (12/17/2022): Hgb 4.9/MCV  80 - Intermittent rectal bleeding, which is at times severe - PLAN: Recommend direct admission to hospital due to increased severity of rectal bleeding and Hgb 4.9. - No IV iron at this time.  We will continue to watch iron levels closely as she is also at risk for iron overload due to frequent blood transfusions. - Weekly CBC + BB sample with possible transfusion    - Goal is to keep ferritin level around 200 (due to chronic GI blood loss and comorbid CKD); goal Hgb is 9.0-10.0 due to patient's underlying thalassemia.  Transfusion threshold is < 7.0. - Full lab panel and office visit in 2 months  2.  Thrombocytopenia & leukopenia  - Intermittent leukopenia since January 2022 (intermittently neutropenia and lymphopenia) - Moderate thrombocytopenia since January 2022, with baseline platelets around 100-150 - CTA abdomen/pelvis (02/05/2022) shows splenomegaly at 1 7.3 cm - CBC today (12/17/2022): WBC 2.4, platelets 119.  Differential from 12/10/2022 showed ANC 1.6 and ALC 0.6. - PLAN: Differential diagnosis favors thrombocytopenia and leukopenia secondary to splenic sequestration and cirrhosis.  We will continue to monitor closely and consider additional workup such as bone marrow biopsy if any major deviations from baseline that are out of proportion to overall clinical picture  3.  Vitamin B12 deficiency  - Noted on 06/24/2021 to have normal vitamin B12 457, but with elevated methylmalonic acid 496 - Most recent labs (12/10/2022) with B12 698 and methylmalonic acid 535  - She is taking vitamin B12 supplement 500 mcg daily   - PLAN: Increase vitamin B12 to 1000 mcg daily to see if this will bring her MMA back to normal.  Would also consider that if MMA remains elevated this may be due to her underlying CKD. - We will recheck B12/methylmalonic acid at follow-up visit in about 6 months (July 2024)   4.  Beta thalassemia intermedia  - She reports that she has always been anemic, but was not diagnosed  with thalassemia until around age 57.  Her father, sister, and brother all have thalassemia. - Her hemoglobin has been as low as 4.6, and she has received  only 1 blood transfusion in the past related to her rectal bleeding.  She has not required regular transfusion for her thalassemia. 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  Discussed extensively with Dr. Ellin Saba, who felt that she may have some underlying degree of hemolysis from her beta thalassemia intermedia, although low haptoglobin may also be from cirrhosis with high reticulocytes from acute blood loss anemia.   - Her highest hemoglobin is around 9-10, but her baseline hemoglobin usually runs from 7.0-8.0 - PLAN: We will transfuse as needed if < 7.0 or severely symptomatic from anemia.     5.  Vitamin D deficiency - Vitamin D deficiency noted at 19.97 (06/24/2021) - Taking vitamin D 50,000 units weekly since October 2023.  Decreased to every other week dosing in February 2024. - Most recent vitamin D (12/10/2022) normal at 33.75 - PLAN: Continue vitamin D 50,000 units.  Will recheck vitamin D in 4 months (  September 2024)  6.  History of uterine cancer - Diagnosed in 2010, she has total hysterectomy and bilateral oophorectomy - She did not require any chemotherapy or radiation - She was treated in [place], [place] - Her last CT imaging of her abdomen and pelvis showed no evidence of cancer recurrence   7.  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 - Continues to follow closely with Dr. Mia Creek (GI) and Dr. Elby Showers (interventional radiology). - Per Dr. Elby Showers, he is planning for partial splenic embolization on 12/29/22 at [facility] to decrease her portal hypertension and improve her blood/coagulation profile.  Further reembolization of rectal arteries likely would be unsuccessful, as that has been done 3 times without significant improvement.  Portal hypertension/venous pressures are thought to be main driver of ongoing rectal bleeding.  PLAN SUMMARY: >> Direct admission to hospital today >> Weekly CBC + BB sample + possible transfusion (day after labs) >> Full lab panel in 2 months = CBC/D, CMP, ferritin, iron/TIBC >> OFFICE visit in 2 months (1 week after full lab panel)    REVIEW OF SYSTEMS:  Review of Systems  Constitutional:  Positive for fatigue. Negative for appetite change, chills, diaphoresis, fever and unexpected weight change.  HENT:   Negative for lump/mass and nosebleeds.   Eyes:  Negative for eye problems.  Respiratory:  Positive for shortness of breath. Negative for cough and hemoptysis.   Cardiovascular:  Positive for leg swelling. Negative for chest pain and palpitations.  Gastrointestinal:  Positive for abdominal distention, blood in stool, constipation and diarrhea. Negative for abdominal pain, nausea and vomiting.  Genitourinary:  Negative for hematuria.   Musculoskeletal:  Positive for arthralgias.  Skin:  Positive for itching.  Neurological:  Positive for dizziness, headaches and light-headedness.  Hematological:  Bruises/bleeds easily.     PHYSICAL EXAM:  ECOG PERFORMANCE STATUS: 3 -  Symptomatic, >50% confined to bed  There were no vitals filed for this visit. There were no vitals filed for this visit. Physical Exam Constitutional:      Appearance: Normal appearance. She is obese.  HENT:     Head: Normocephalic and atraumatic.     Mouth/Throat:     Mouth: Mucous membranes are moist.  Eyes:     Extraocular Movements: Extraocular movements intact.     Pupils: Pupils are equal, round, and reactive to light.  Cardiovascular:     Rate and Rhythm: Normal rate and regular rhythm.     Pulses: Normal pulses.     Heart sounds: Normal heart sounds.  Pulmonary:     Effort: Pulmonary effort is normal.     Breath sounds: Rales (Faint bibasilar crackles) present.  Abdominal:     General: Bowel sounds are normal. There is distension.     Palpations: Abdomen is soft.     Tenderness: There is no abdominal tenderness.  Musculoskeletal:        General: No swelling.     Right lower leg: Edema (2+) present.     Left lower leg: Edema (2+) present.  Lymphadenopathy:     Cervical: No cervical adenopathy.  Skin:    General: Skin is warm and dry.     Comments:  Significant skin thickening and hyperpigmentation over bilateral lower extremities in keeping with stasis dermatitis  Neurological:     General: No focal deficit present.     Mental Status: She is alert and oriented to person, place, and time.  Psychiatric:        Mood and Affect: Mood normal.        Behavior: Behavior normal.     PAST MEDICAL/SURGICAL HISTORY:  Past Medical History:  Diagnosis Date   Anemia in chronic kidney disease (CKD) 02/07/2022   Anxiety    Arthritis    Cirrhosis of liver (HCC)    Diabetes mellitus without complication (HCC)    Dyspnea    DOE   GERD (gastroesophageal reflux disease)    Grade IV internal hemorrhoids    Contingency plan for any future admissions for severe anemia in the setting of persistent GI bleed:  nuclear medicine tagged RBC scan to assess for location of GI bleeding -  if from rectum, would then proceed to angiogram with possible repeat embolization.  Please notify IR in this case.   Hepatitis    PAST   Hypertension    Neuropathy    Neuropathy, diabetic (HCC)    Pneumonia    Sinus complaint    Sleep apnea    CPAP   Thalassemia minor 1992   Past Surgical History:  Procedure Laterality Date   ABDOMINAL HYSTERECTOMY     BREAST BIOPSY Right 02/15/2018   [place] bx 6-6:30 ribbon shape, ONE CORE FRAGMENT WITH FIBROSIS. ONE CORE FRAGMENT WITH PORTION OF A DILATED   BREAST BIOPSY Right 02/15/2018   [place] bx 9:00 heart shape, USUAL DUCTAL HYPERPLASIA   BREAST LUMPECTOMY Right 03/09/2018   Procedure: BREAST LUMPECTOMY x 2;  Surgeon: Sung Amabile, DO;  Location: [facility] ORS;  Service: General;  Laterality: Right;   CATARACT EXTRACTION W/PHACO Left 06/11/2016   Procedure: CATARACT EXTRACTION PHACO AND INTRAOCULAR LENS PLACEMENT (IOC);  Surgeon: Sallee Lange, MD;  Location: [facility] ORS;  Service: Ophthalmology;  Laterality: Left;  Lot # [id number] H US:01:38.6 AP%:26.4 CDE:44.15   CATARACT EXTRACTION W/PHACO Right 06/15/2018   Procedure: CATARACT EXTRACTION PHACO AND INTRAOCULAR LENS PLACEMENT (IOC);  Surgeon: Galen Manila, MD;  Location: [facility] ORS;  Service: Ophthalmology;  Laterality: Right;  [place] 00:38.2 CDE 4.23 Fluid Pack Lot # [id number]   COLONOSCOPIES     COLONOSCOPY WITH PROPOFOL N/A 10/10/2020   Procedure: COLONOSCOPY WITH PROPOFOL;  Surgeon: Regis Bill, MD;  Location: [facility] ENDOSCOPY;  Service: Endoscopy;  Laterality: N/A;   COLONOSCOPY WITH PROPOFOL N/A 11/20/2020   Procedure: COLONOSCOPY WITH PROPOFOL;  Surgeon: Regis Bill, MD;  Location: [facility] ENDOSCOPY;  Service: Endoscopy;  Laterality: N/A;  DM STAT CBC, BMP COVID POSITIVE 09/02/2020   COLONOSCOPY WITH PROPOFOL N/A 06/19/2022   Procedure: COLONOSCOPY WITH PROPOFOL;  Surgeon: Regis Bill, MD;  Location: [facility] ENDOSCOPY;  Service: Endoscopy;  Laterality: N/A;   CTR      ESOPHAGOGASTRODUODENOSCOPY (EGD) WITH PROPOFOL N/A 10/10/2020   Procedure: ESOPHAGOGASTRODUODENOSCOPY (EGD) WITH PROPOFOL;  Surgeon: Regis Bill, MD;  Location: [facility] ENDOSCOPY;  Service: Endoscopy;  Laterality: N/A;  COVID POSITIVE 10/08/2020   FLEXIBLE SIGMOIDOSCOPY N/A 02/06/2022   Procedure: FLEXIBLE SIGMOIDOSCOPY;  Surgeon: Regis Bill, MD;  Location: [facility] ENDOSCOPY;  Service: Endoscopy;  Laterality: N/A;  Patient requests anesthesia   IR ANGIOGRAM SELECTIVE EACH ADDITIONAL VESSEL  02/07/2022   IR ANGIOGRAM SELECTIVE EACH ADDITIONAL VESSEL  02/07/2022   IR ANGIOGRAM SELECTIVE EACH ADDITIONAL VESSEL  09/05/2022   IR  ANGIOGRAM VISCERAL SELECTIVE  02/07/2022   IR ANGIOGRAM VISCERAL SELECTIVE  09/05/2022   IR EMBO ART  VEN HEMORR LYMPH EXTRAV  INC GUIDE ROADMAPPING  09/05/2022   IR EMBO ART  VEN HEMORR LYMPH EXTRAV  INC GUIDE ROADMAPPING  11/05/2022   IR EMBO ARTERIAL NOT HEMORR HEMANG INC GUIDE ROADMAPPING  02/07/2022   IR IMAGING GUIDED PORT INSERTION  06/13/2022   IR IMAGING GUIDED PORT INSERTION  11/25/2022   IR PARACENTESIS  12/17/2021   IR PARACENTESIS  03/25/2022   IR PARACENTESIS  11/25/2022   IR RADIOLOGIST EVAL & MGMT  03/18/2022   IR RADIOLOGIST EVAL & MGMT  05/07/2022   IR RADIOLOGIST EVAL & MGMT  05/16/2022   IR RADIOLOGIST EVAL & MGMT  10/13/2022   IR RADIOLOGIST EVAL & MGMT  12/12/2022   IR US GUIDE VASC ACCESS RIGHT  02/07/2022   IR US GUIDE VASC ACCESS RIGHT  09/05/2022   JOINT REPLACEMENT     KNEE SURGERY Right 09/24/2017   plates and pins   PORT-A-CATH REMOVAL N/A 09/23/2022   Procedure: MINOR REMOVAL PORT-A-CATH;  Surgeon: Franky Macho, MD;  Location: [facility] OR[facility];  Service: General;  Laterality: N/A;   TEE WITHOUT CARDIOVERSION N/A 09/23/2022   Procedure: TRANSESOPHAGEAL ECHOCARDIOGRAM (TEE);  Surgeon: Antoine Poche, MD;  Location: [facility];  Service: Endoscopy;  Laterality: N/A;   TOTAL SHOULDER ARTHROPLASTY Right 09/27/2015    SOCIAL HISTORY:  Social History   Socioeconomic  History   Marital status: Married    Spouse name: Not on file   Number of children: 0   Years of education: Not on file   Highest education level: Not on file  Occupational History   Occupation: EMPLOYED  Tobacco Use   Smoking status: Never   Smokeless tobacco: Never  Vaping Use   Vaping Use: Never used  Substance and Sexual Activity   Alcohol use: No   Drug use: Never   Sexual activity: Not Currently  Other Topics Concern   Not on file  Social History Narrative   Not on file   Social Determinants of Health   Financial Resource Strain: Low Risk  (04/04/2022)   Overall Financial Resource Strain (CARDIA)    Difficulty of Paying Living Expenses: Not hard at all  Food Insecurity: No Food Insecurity (11/03/2022)   Hunger Vital Sign    Worried About Running Out of Food in the Last Year: Never true    Ran Out of Food in the Last Year: Never true  Transportation Needs: No Transportation Needs (11/03/2022)   PRAPARE - Administrator, Civil Service (Medical): No    Lack of Transportation (Non-Medical): No  Physical Activity: Inactive (04/04/2022)   Exercise Vital Sign    Days of Exercise per Week: 0 days    Minutes of Exercise per Session: 0 min  Stress: No Stress Concern Present (04/04/2022)   [employer] of Occupational Health - Occupational Stress Questionnaire    Feeling of Stress : Only a little  Social Connections: Moderately Isolated (04/04/2022)   Social Connection and Isolation Panel [NHANES]    Frequency of Communication with Friends and Family: More than three times a week    Frequency of Social Gatherings with Friends and Family: Three times a week    Attends Religious Services: Never    Active Member of Clubs or Organizations: No    Attends Banker Meetings: Never    Marital Status: Married  Catering manager Violence: Not At Risk (11/03/2022)  Humiliation, Afraid, Rape, and Kick questionnaire    Fear of Current or Ex-Partner: No     Emotionally Abused: No    Physically Abused: No    Sexually Abused: No    FAMILY HISTORY:  Family History  Problem Relation Age of Onset   Breast cancer Mother 16   Lymphoma Mother    Diabetes Father    Kidney cancer Father    Heart disease Father    Diabetes Sister    Breast cancer Sister 48    CURRENT MEDICATIONS:  Outpatient Encounter Medications as of 12/17/2022  Medication Sig Note   acetaminophen (TYLENOL) 650 MG CR tablet Take 1,300 mg by mouth daily as needed for pain.    albuterol (VENTOLIN HFA) 108 (90 Base) MCG/ACT inhaler Inhale 2 puffs into the lungs every 6 (six) hours as needed for wheezing or shortness of breath.    atorvastatin (LIPITOR) 10 MG tablet Take 1 tablet (10 mg total) by mouth daily.    ciprofloxacin (CIPRO) 500 MG tablet Take 500 mg by mouth daily with breakfast.    diphenhydrAMINE (BENADRYL) 25 MG tablet Take 25 mg by mouth 2 (two) times daily as needed for itching.    ergocalciferol (VITAMIN D2) 1.25 MG (50000 UT) capsule Take 1 capsule (50,000 Units total) by mouth every 14 (fourteen) days.    gabapentin (NEURONTIN) 400 MG capsule Take 400 mg by mouth 2 (two) times daily.    melatonin 5 MG TABS Take 10 mg by mouth at bedtime as needed (sleep).    midodrine (PROAMATINE) 10 MG tablet Take 10 mg by mouth 3 (three) times daily.    midodrine (PROAMATINE) 10 MG tablet Take by mouth.    Multiple Vitamins-Minerals (MULTIVITAMIN WITH MINERALS) tablet Take 1 tablet by mouth daily.    oxymetazoline (AFRIN) 0.05 % nasal spray Place 1 spray into both nostrils daily as needed for congestion.    polyethylene glycol (MIRALAX / GLYCOLAX) 17 g packet Take 17 g by mouth 2 (two) times daily.    potassium chloride SA (KLOR-CON M) 20 MEQ tablet Take 1 tablet (20 mEq total) by mouth 2 (two) times daily.    sertraline (ZOLOFT) 50 MG tablet Take 50 mg by mouth daily.    spironolactone (ALDACTONE) 25 MG tablet Take 1 tablet (25 mg total) by mouth daily.    torsemide 40 MG TABS  Take 40 mg by mouth daily. 11/03/2022: Waiting for pharmacy to get in   vitamin B-12 (CYANOCOBALAMIN) 1000 MCG tablet Take 1,000 mcg by mouth daily.    witch hazel-glycerin (TUCKS) pad Apply topically as needed for hemorrhoids.    Facility-Administered Encounter Medications as of 12/17/2022  Medication   diphenhydrAMINE (BENADRYL) capsule 25 mg    ALLERGIES:  Allergies  Allergen Reactions   Gramineae Pollens Other (See Comments)    Sneezing, Running nose   Latex Rash    Contact rash    LABORATORY DATA:  I have reviewed the labs as listed.  CBC    Component Value Date/Time   WBC 2.8 (L) 12/10/2022 1003   RBC 2.53 (L) 12/10/2022 1003   HGB 6.2 (LL) 12/10/2022 1003   HCT 20.3 (L) 12/10/2022 1003   PLT 113 (L) 12/10/2022 1003   MCV 80.2 12/10/2022 1003   MCH 24.5 (L) 12/10/2022 1003   MCHC 30.5 12/10/2022 1003   RDW 20.2 (H) 12/10/2022 1003   LYMPHSABS 0.6 (L) 12/10/2022 1003   MONOABS 0.4 12/10/2022 1003   EOSABS 0.2 12/10/2022 1003   BASOSABS 0.0  12/10/2022 1003      Latest Ref Rng & Units 12/10/2022   10:03 AM 11/17/2022    1:14 AM 11/06/2022    7:09 AM  CMP  Glucose 70 - 99 mg/dL 960  454  098   BUN 8 - 23 mg/dL 43  45  47   Creatinine 0.44 - 1.00 mg/dL 1.19  1.47  8.29   Sodium 135 - 145 mmol/L 134  133  140   Potassium 3.5 - 5.1 mmol/L 4.2  4.0  3.2   Chloride 98 - 111 mmol/L 108  104  108   CO2 22 - 32 mmol/L 20  22  22    Calcium 8.9 - 10.3 mg/dL 8.0  7.9  7.8   Total Protein 6.5 - 8.1 g/dL 4.8  5.0    Total Bilirubin 0.3 - 1.2 mg/dL 1.4  1.5    Alkaline Phos 38 - 126 U/L 129  122    AST 15 - 41 U/L 45  52    ALT 0 - 44 U/L 24  21      DIAGNOSTIC IMAGING:  I have independently reviewed the relevant imaging and discussed with the patient.   WRAP UP:  All questions were answered. The patient knows to call the clinic with any problems, questions or concerns.  Medical decision making: High (decision made for direct admission to hospital after office  visit)  Time spent on visit: I spent 40 minutes counseling the patient face to face. The total time spent in the appointment was 60 minutes and more than 50% was on counseling, with additional time spent on documentation, discussion with other specialists (Dr. Laural Benes hospitalist, Dr. Elby Showers interventional radiology, and Dr. Mia Creek gastroenterology), and initiating admission to hospital.  Carnella Guadalajara, PA-C  12/17/22 11:54 AM

## 2022-12-17 NOTE — Progress Notes (Signed)
Patients port flushed without difficulty.  Good blood return noted with no bruising or swelling noted at site. Patient remains accessed.  

## 2022-12-17 NOTE — H&P (Signed)
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  Unfortunately despite these procedures she has continued to require near weekly blood transfusions and weekly large volume paracentesis.  Pt had paracentesis 4/30 where 8L of ascitic fluid was removed. Pt is working with [facility] to complete liver transplant workup.  She is working with radiologist Dr. Elby Showers for consideration of partial splenic embolization to help decrease portal hypertension.  Dr. Elby Showers has said that this has been approved by [facility] IR and is being tentatively scheduled.   [facility] to [facility] for [facility] visit and noted to be very pale and fatigued. CBC revealed that her Hg was 4.9.  She will require blood transfusion.  Hematology recommends at least 3 units PRBCs with goal of getting hemoglobin greater than 7 and sustaining at greater than 7.  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   This was discussed with her GI (C. Locklear) and IR (D. Suttle) doctors at [facility] and they have recommended that she be transfused now at [facility].  IF she doesn't respond to  transfusion then they recommend she transfer to [facility] and Dr. Elby Showers would discuss other options with her for interventions.  They feel this is due to portal hypertension.  She has soft blood pressures but she is otherwise hemodynamically stable and mentating well.  She will be admitted to [facility] stepdown ICU and will transfuse 3 units PRBC.      Past Medical History:  Diagnosis Date   Anemia in chronic kidney disease (CKD) 02/07/2022   Anxiety    Arthritis    Cirrhosis of liver (HCC)    Diabetes mellitus without complication (HCC)    Dyspnea    DOE   GERD (gastroesophageal reflux disease)    Grade IV internal hemorrhoids    Contingency plan for any future admissions for severe anemia in the setting of persistent GI bleed:  nuclear medicine tagged RBC scan to assess for location of GI bleeding - if from rectum, would then proceed to angiogram with possible repeat embolization.  Please notify IR in this case.   Hepatitis    PAST   Hypertension    Neuropathy    Neuropathy, diabetic (HCC)    Pneumonia    Sinus complaint    Sleep apnea    CPAP   Thalassemia minor 1992    Past Surgical History:  Procedure Laterality Date   ABDOMINAL HYSTERECTOMY     BREAST BIOPSY Right 02/15/2018   [place] bx 6-6:30 ribbon shape, ONE CORE FRAGMENT WITH FIBROSIS. ONE CORE FRAGMENT WITH PORTION OF A DILATED   BREAST BIOPSY Right 02/15/2018   [place] bx 9:00 heart shape, USUAL DUCTAL HYPERPLASIA   BREAST LUMPECTOMY Right  03/09/2018   Procedure: BREAST LUMPECTOMY x 2;  Surgeon: Sung Amabile, DO;  Location: [facility] ORS;  Service: General;  Laterality: Right;   CATARACT EXTRACTION W/PHACO Left 06/11/2016   Procedure: CATARACT EXTRACTION PHACO AND INTRAOCULAR LENS PLACEMENT (IOC);  Surgeon: Sallee Lange, MD;  Location: [facility] ORS;  Service: Ophthalmology;  Laterality: Left;  Lot # [id number] H US:01:38.6 AP%:26.4 CDE:44.15   CATARACT EXTRACTION W/PHACO Right 06/15/2018   Procedure: CATARACT EXTRACTION PHACO AND  INTRAOCULAR LENS PLACEMENT (IOC);  Surgeon: Galen Manila, MD;  Location: [facility] ORS;  Service: Ophthalmology;  Laterality: Right;  [place] 00:38.2 CDE 4.23 Fluid Pack Lot # [id number]   COLONOSCOPIES     COLONOSCOPY WITH PROPOFOL N/A 10/10/2020   Procedure: COLONOSCOPY WITH PROPOFOL;  Surgeon: Regis Bill, MD;  Location: [facility] ENDOSCOPY;  Service: Endoscopy;  Laterality: N/A;   COLONOSCOPY WITH PROPOFOL N/A 11/20/2020   Procedure: COLONOSCOPY WITH PROPOFOL;  Surgeon: Regis Bill, MD;  Location: [facility] ENDOSCOPY;  Service: Endoscopy;  Laterality: N/A;  DM STAT CBC, BMP COVID POSITIVE 09/02/2020   COLONOSCOPY WITH PROPOFOL N/A 06/19/2022   Procedure: COLONOSCOPY WITH PROPOFOL;  Surgeon: Regis Bill, MD;  Location: [facility] ENDOSCOPY;  Service: Endoscopy;  Laterality: N/A;   CTR     ESOPHAGOGASTRODUODENOSCOPY (EGD) WITH PROPOFOL N/A 10/10/2020   Procedure: ESOPHAGOGASTRODUODENOSCOPY (EGD) WITH PROPOFOL;  Surgeon: Regis Bill, MD;  Location: [facility] ENDOSCOPY;  Service: Endoscopy;  Laterality: N/A;  COVID POSITIVE 10/08/2020   FLEXIBLE SIGMOIDOSCOPY N/A 02/06/2022   Procedure: FLEXIBLE SIGMOIDOSCOPY;  Surgeon: Regis Bill, MD;  Location: [facility] ENDOSCOPY;  Service: Endoscopy;  Laterality: N/A;  Patient requests anesthesia   IR ANGIOGRAM SELECTIVE EACH ADDITIONAL VESSEL  02/07/2022   IR ANGIOGRAM SELECTIVE EACH ADDITIONAL VESSEL  02/07/2022   IR ANGIOGRAM SELECTIVE EACH ADDITIONAL VESSEL  09/05/2022   IR ANGIOGRAM VISCERAL SELECTIVE  02/07/2022   IR ANGIOGRAM VISCERAL SELECTIVE  09/05/2022   IR EMBO ART  VEN HEMORR LYMPH EXTRAV  INC GUIDE ROADMAPPING  09/05/2022   IR EMBO ART  VEN HEMORR LYMPH EXTRAV  INC GUIDE ROADMAPPING  11/05/2022   IR EMBO ARTERIAL NOT HEMORR HEMANG INC GUIDE ROADMAPPING  02/07/2022   IR IMAGING GUIDED PORT INSERTION  06/13/2022   IR IMAGING GUIDED PORT INSERTION  11/25/2022   IR PARACENTESIS  12/17/2021   IR PARACENTESIS  03/25/2022   IR PARACENTESIS  11/25/2022   IR  RADIOLOGIST EVAL & MGMT  03/18/2022   IR RADIOLOGIST EVAL & MGMT  05/07/2022   IR RADIOLOGIST EVAL & MGMT  05/16/2022   IR RADIOLOGIST EVAL & MGMT  10/13/2022   IR RADIOLOGIST EVAL & MGMT  12/12/2022   IR US GUIDE VASC ACCESS RIGHT  02/07/2022   IR US GUIDE VASC ACCESS RIGHT  09/05/2022   JOINT REPLACEMENT     KNEE SURGERY Right 09/24/2017   plates and pins   PORT-A-CATH REMOVAL N/A 09/23/2022   Procedure: MINOR REMOVAL PORT-A-CATH;  Surgeon: Franky Macho, MD;  Location: [facility];  Service: General;  Laterality: N/A;   TEE WITHOUT CARDIOVERSION N/A 09/23/2022   Procedure: TRANSESOPHAGEAL ECHOCARDIOGRAM (TEE);  Surgeon: Antoine Poche, MD;  Location: [facility];  Service: Endoscopy;  Laterality: N/A;   TOTAL SHOULDER ARTHROPLASTY Right 09/27/2015     reports that she has never smoked. She has never used smokeless tobacco. She reports that she does not drink alcohol and does not use drugs.  Allergies  Allergen Reactions   Gramineae Pollens Other (See Comments)    Sneezing, Running nose  Latex Rash    Contact rash    Family History  Problem Relation Age of Onset   Breast cancer Mother 42   Lymphoma Mother    Diabetes Father    Kidney cancer Father    Heart disease Father    Diabetes Sister    Breast cancer Sister 75    Prior to Admission medications   Medication Sig Start Date End Date Taking? Authorizing Provider  acetaminophen (TYLENOL) 650 MG CR tablet Take 1,300 mg by mouth daily as needed for pain.    [provider]  albuterol (VENTOLIN HFA) 108 (90 Base) MCG/ACT inhaler Inhale 2 puffs into the lungs every 6 (six) hours as needed for wheezing or shortness of breath. 02/10/22   Alford Highland, MD  atorvastatin (LIPITOR) 10 MG tablet Take 1 tablet (10 mg total) by mouth daily. 06/20/22   Enedina Finner, MD  ciprofloxacin (CIPRO) 500 MG tablet Take 500 mg by mouth daily with breakfast.    [provider]  diphenhydrAMINE (BENADRYL) 25 MG tablet Take 25 mg by mouth 2  (two) times daily as needed for itching.    [provider]  ergocalciferol (VITAMIN D2) 1.25 MG (50000 UT) capsule Take 1 capsule (50,000 Units total) by mouth every 14 (fourteen) days. 09/01/22   Carnella Guadalajara, PA-C  gabapentin (NEURONTIN) 400 MG capsule Take 400 mg by mouth 2 (two) times daily. 01/07/18   [provider]  melatonin 5 MG TABS Take 10 mg by mouth at bedtime as needed (sleep).    [provider]  midodrine (PROAMATINE) 10 MG tablet Take 10 mg by mouth 3 (three) times daily.    [provider]  midodrine (PROAMATINE) 10 MG tablet Take by mouth.    [provider]  Multiple Vitamins-Minerals (MULTIVITAMIN WITH MINERALS) tablet Take 1 tablet by mouth daily.    [provider]  oxymetazoline (AFRIN) 0.05 % nasal spray Place 1 spray into both nostrils daily as needed for congestion.    [provider]  polyethylene glycol (MIRALAX / GLYCOLAX) 17 g packet Take 17 g by mouth 2 (two) times daily. 06/20/22   Enedina Finner, MD  potassium chloride SA (KLOR-CON M) 20 MEQ tablet Take 1 tablet (20 mEq total) by mouth 2 (two) times daily. 10/26/22   Catarina Hartshorn, MD  sertraline (ZOLOFT) 50 MG tablet Take 50 mg by mouth daily. 12/12/17   [provider]  spironolactone (ALDACTONE) 25 MG tablet Take 1 tablet (25 mg total) by mouth daily. 10/26/22   Catarina Hartshorn, MD  torsemide 40 MG TABS Take 40 mg by mouth daily. 10/26/22   Catarina Hartshorn, MD  vitamin B-12 (CYANOCOBALAMIN) 1000 MCG tablet Take 1,000 mcg by mouth daily.    [provider]  witch hazel-glycerin (TUCKS) pad Apply topically as needed for hemorrhoids. 03/07/22   Alford Highland, MD    Physical Exam: Vitals:   12/17/22 1116  BP: (!) 142/61  Pulse: 72  Resp: 13  Temp: (!) 97.2 F (36.2 C)  TempSrc: Oral  SpO2: 100%  Weight: 117.9 kg  Height: 5\' 9"  (1.753 m)    Constitutional: obese female, sitting up in chair, appears pale, appears fatigued. Oriented x 3.   Eyes: PERRL, lids and conjunctivae normal, mild scleral icterus. ENMT: Mucous membranes are pale, moist. Posterior pharynx clear of any exudate or lesions.  Neck: normal, supple, no masses, no thyromegaly Respiratory: rare crackles at bases. Normal respiratory effort. No accessory muscle use.  Cardiovascular: normal s1, s2  sounds, no murmurs / rubs / gallops. 2++ bilateral lower extremity edema. 2+ pedal pulses. No carotid bruits.  Abdomen: obese, no tenderness, no masses palpated. Bowel sounds positive.  Musculoskeletal: no clubbing / cyanosis. No joint deformity upper and lower extremities. Good ROM, no contractures. Normal muscle tone.  Skin: no rashes, lesions, ulcers. No induration Neurologic: CN 2-12 grossly intact. Sensation intact, DTR normal. Strength 5/5 in all 4.  Psychiatric: Normal judgment and insight. Alert and oriented x 3. Normal mood.   Labs on Admission: I have personally reviewed following labs and imaging studies  CBC: Recent Labs  Lab 12/17/22 0910  WBC 2.4*  HGB 4.9*  HCT 15.6*  MCV 80.0  PLT 119*   Basic Metabolic Panel: No results for input(s): "NA", "K", "CL", "CO2", "GLUCOSE", "BUN", "CREATININE", "CALCIUM", "MG", "PHOS" in the last 168 hours. GFR: Estimated Creatinine Clearance: 53.4 mL/min (A) (by C-G formula based on SCr of 1.44 mg/dL (H)). Liver Function Tests: No results for input(s): "AST", "ALT", "ALKPHOS", "BILITOT", "PROT", "ALBUMIN" in the last 168 hours. No results for input(s): "LIPASE", "AMYLASE" in the last 168 hours. No results for input(s): "AMMONIA" in the last 168 hours. Coagulation Profile: No results for input(s): "INR", "PROTIME" in the last 168 hours. Cardiac Enzymes: No results for input(s): "CKTOTAL", "CKMB", "CKMBINDEX", "TROPONINI" in the last 168 hours. BNP (last 3 results) No results for input(s): "PROBNP" in the last 8760 hours. HbA1C: No results for input(s): "HGBA1C" in the last 72 hours. CBG: Recent Labs  Lab  12/17/22 1130  GLUCAP 152*   Lipid Profile: Recent Labs    12/16/22 1315  CHOL 75  HDL 21*  LDLCALC 42  TRIG 61  CHOLHDL 3.6   Thyroid Function Tests: No results for input(s): "TSH", "T4TOTAL", "FREET4", "T3FREE", "THYROIDAB" in the last 72 hours. Anemia Panel: No results for input(s): "VITAMINB12", "FOLATE", "FERRITIN", "TIBC", "IRON", "RETICCTPCT" in the last 72 hours. Urine analysis:    Component Value Date/Time   COLORURINE YELLOW (A) 11/04/2022 0220   APPEARANCEUR CLEAR (A) 11/04/2022 0220   LABSPEC 1.008 11/04/2022 0220   PHURINE 5.0 11/04/2022 0220   GLUCOSEU NEGATIVE 11/04/2022 0220   HGBUR SMALL (A) 11/04/2022 0220   BILIRUBINUR NEGATIVE 11/04/2022 0220   KETONESUR NEGATIVE 11/04/2022 0220   PROTEINUR NEGATIVE 11/04/2022 0220   NITRITE NEGATIVE 11/04/2022 0220   LEUKOCYTESUR SMALL (A) 11/04/2022 0220    Radiological Exams on Admission: US Paracentesis  Result Date: 12/16/2022 INDICATION: History of NASH cirrhosis with recurrent symptomatic ascites despite TIPS creation and revision (tips creation and revision performed at [facility]. Patient currently being evaluated by Dr. Elby Showers for consideration splenic embolization Patient presents today for routine ultrasound-guided paracentesis with max volume of 8 L. EXAM: ULTRASOUND-GUIDED PARACENTESIS COMPARISON:  Multiple previous ultrasound-guided paracenteses, most recently on 12/09/2022 yielding 5.8 L of peritoneal fluid. MEDICATIONS: None. COMPLICATIONS: None immediate. TECHNIQUE: Informed written consent was obtained from the patient after a discussion of the risks, benefits and alternatives to treatment. A timeout was performed prior to the initiation of the procedure. Initial ultrasound scanning demonstrates a moderate amount of ascites within the abdomen with dominant pocket located within the right mid abdomen. Right mid abdomen was subsequently prepped and draped in usual sterile fashion. 1%  lidocaine with epinephrine was used for local anesthesia. Under direct ultrasound guidance, an 8 Fr Safe-T-Centesis catheter was introduced. Multiple ultrasound images were saved procedural documentation purposes. The paracentesis was performed. The catheter was removed and a dressing was applied. The patient tolerated the  procedure well without immediate post procedural complication. FINDINGS: A total of approximately 8 liters of serous fluid was removed. IMPRESSION: Successful ultrasound-guided paracentesis yielding 8 liters of peritoneal fluid. Electronically Signed   By: Simonne Come M.D.   On: 12/16/2022 12:21    Assessment/Plan Principal Problem:   Symptomatic anemia Active Problems:   Type 2 diabetes mellitus with hyperlipidemia (HCC)   Thalassemia minor   HLD (hyperlipidemia)   Depression   Decompensated hepatic cirrhosis (HCC)   OSA on CPAP   Iron deficiency anemia due to chronic blood loss   History of uterine cancer   Insomnia   GI bleeding   Chronic kidney disease, stage 3b (HCC)   Depression with anxiety   Liver cirrhosis secondary to NASH (HCC)   Thrombocytopenia (HCC)   Grade IV internal hemorrhoids   Rectal bleeding   Obesity (BMI 30-39.9)   Hypotension    Recurrent symptomatic Anemia - secondary to recurrent rectal bleeding from grade 4 internal hemorrhoids exacerbated by portal hypertension  - Hg down to 4.9 - Hematologist recommends keeping Hg>7 and holding at >7 - transfuse 3 units PRBC after discussion with hematologist - 2 units ahead on hold  - give lasix IV after 2nd unit PRBC given - discussed with her GI and IR at [facility], no need to transfer to [facility] now - transfuse blood at [facility], if that is not effective and Hg still trending down rapidly then transfer to [facility] - Dr. Elby Showers reported that [facility] IR approved for her to have partial splenic embolization to reduce portal hypertension, this is being scheduled for around 5/13 at [facility] - recheck CBC in AM    Decompensated NASH liver cirrhosis  - pt receives weekly large volume paracentesis - pt had para on 4/30 at [facility] where 8L ascitic fluid was removed - resume home diuretics  - pt working closely with [facility] for liver transplant, has almost completed all of the requirements per pt   Stage 3b CKD - renal function holding stable from recent tests - continue to renally dose medications  - recheck BMP in AM   Thrombocytopenia  - secondary to chronic liver disease - holding all heparin products - recheck CBC in AM   Hypotension  - multifactorial  - exacerbated due to severe anemia - transfusing 3 units PRBC - follow in stepdown ICU  - resume home midodrine 10 mg TID   Type 2 DM with renal complications  - has mostly been diet controlled - most recent A1c was 5.5%  - very sensitive SSI coverage as needed  OSA - nightly CPAP ordered  Grade IV internal hemorrhoids and rectal bleeding - discussed with GI and IR - transfuse for now - if no improvement, transfer to [facility] - outpatient partial splenic embolization being arranged by Dr. Elby Showers to reduce portal hypertension    DVT prophylaxis: TED hose   Code Status: Full   Family Communication: husband at bedside   Disposition Plan: anticipate home if stabilized by transfusions [facility]   Consults called: GI and IR   Admission status: INP   Level of care: Stepdown Standley Dakins MD [facility] How to contact the [facility] Attending or Consulting provider 7A - 7P or covering provider during after hours 7P -7A, for this patient?  Check the care team in [facility] and look for a) attending/consulting [facility] provider listed and b) the [facility] team listed Log into [email or web address] and use [facility] universal password to access. If you do not have  the password, please contact the hospital operator. Locate the [facility] provider you are looking for under [facility] and page to a number that you can be directly reached. If you  still have difficulty reaching the provider, please page the [facility] (Director on Call) for the Hospitalists listed on amion for assistance.   If 7PM-7AM, please contact night-coverage [email or web address] Password [facility]  12/17/2022, 11:47 AM

## 2022-12-17 NOTE — Progress Notes (Signed)
CRITICAL VALUE ALERT Critical value received:  HGB 4.9.  Date of notification:  12-17-2022. Time of notification: 0934 am Critical value read back:  Yes.   Nurse who received alert:  B.Atoya Andrew RN.  MD notified time and response:  R.Parowan PA @ [phone]. Patient has appointment with Romero Belling today. Possible admit per R. Pennington PA.

## 2022-12-17 NOTE — Hospital Course (Addendum)
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  Unfortunately despite these procedures she has continued to require near weekly blood transfusions and weekly large volume paracentesis.  Pt had paracentesis 4/30 where 8L of ascitic fluid was removed. Pt is working with [facility] to complete liver transplant workup.  She is working with radiologist Dr. Elby Showers for consideration of partial splenic embolization to help decrease portal hypertension.  Dr. Elby Showers has said that this has been approved by [facility] IR and is being tentatively scheduled.   [facility] to [facility] for [facility] visit and noted to be very pale and fatigued. CBC revealed that her Hg was 4.9.  She will require blood transfusion.  Hematology recommends at least 3 units PRBCs with goal of getting hemoglobin greater than 7 and sustaining at greater than 7.  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   This was discussed with her GI (C. Locklear) and IR (D. Suttle) doctors at [facility] and they have recommended that she be transfused now at [facility].  IF she doesn't respond to transfusion then they recommend she transfer to [facility] and Dr. Elby Showers would discuss other options with her for interventions.  They feel this is due to portal hypertension.  She has soft blood pressures but she is otherwise hemodynamically stable and mentating well.  She will be admitted to [facility] stepdown ICU and will transfused 4 units PRBC.   GI was consulted  to assist

## 2022-12-17 NOTE — Progress Notes (Signed)
Patient declined CPAP while in hospital. Told patient unit would be brought for her if she changed her mind. No unit in room at this time.

## 2022-12-17 NOTE — Plan of Care (Signed)

## 2022-12-18 ENCOUNTER — Inpatient Hospital Stay: Payer: Medicare Other

## 2022-12-18 ENCOUNTER — Encounter (HOSPITAL_COMMUNITY): Payer: Self-pay | Admitting: Family Medicine

## 2022-12-18 DIAGNOSIS — D649 Anemia, unspecified: Secondary | ICD-10-CM | POA: Diagnosis not present

## 2022-12-18 LAB — BPAM RBC
Blood Product Expiration Date: 202405202359
Blood Product Expiration Date: 202405242359
ISSUE DATE / TIME: 202405011227
ISSUE DATE / TIME: 202405021027
Unit Type and Rh: 6200

## 2022-12-18 LAB — COMPREHENSIVE METABOLIC PANEL
ALT: 18 U/L (ref 0–44)
AST: 33 U/L (ref 15–41)
Albumin: 2.1 g/dL — ABNORMAL LOW (ref 3.5–5.0)
Alkaline Phosphatase: 106 U/L (ref 38–126)
Anion gap: 4 — ABNORMAL LOW (ref 5–15)
BUN: 34 mg/dL — ABNORMAL HIGH (ref 8–23)
CO2: 22 mmol/L (ref 22–32)
Calcium: 7.9 mg/dL — ABNORMAL LOW (ref 8.9–10.3)
Chloride: 110 mmol/L (ref 98–111)
Creatinine, Ser: 1.49 mg/dL — ABNORMAL HIGH (ref 0.44–1.00)
GFR, Estimated: 39 mL/min — ABNORMAL LOW (ref >60)
Glucose, Bld: 175 mg/dL — ABNORMAL HIGH (ref 70–99)
Potassium: 4.2 mmol/L (ref 3.5–5.1)
Sodium: 136 mmol/L (ref 135–145)
Total Bilirubin: 1.3 mg/dL — ABNORMAL HIGH (ref 0.3–1.2)
Total Protein: 3.9 g/dL — ABNORMAL LOW (ref 6.5–8.1)

## 2022-12-18 LAB — CBC
HCT: 20.6 % — ABNORMAL LOW (ref 36.0–46.0)
Hemoglobin: 6.8 g/dL — CL (ref 12.0–15.0)
MCH: 25.9 pg — ABNORMAL LOW (ref 26.0–34.0)
MCHC: 33 g/dL (ref 30.0–36.0)
MCV: 78.3 fL — ABNORMAL LOW (ref 80.0–100.0)
Platelets: 125 10*3/uL — ABNORMAL LOW (ref 150–400)
RBC: 2.63 MIL/uL — ABNORMAL LOW (ref 3.87–5.11)
RDW: 18 % — ABNORMAL HIGH (ref 11.5–15.5)
WBC: 3.6 10*3/uL — ABNORMAL LOW (ref 4.0–10.5)
nRBC: 0 % (ref 0.0–0.2)

## 2022-12-18 LAB — HEMOGLOBIN AND HEMATOCRIT, BLOOD
HCT: 20.7 % — ABNORMAL LOW (ref 36.0–46.0)
HCT: 23.9 % — ABNORMAL LOW (ref 36.0–46.0)
HCT: 23.4 % — ABNORMAL LOW (ref 36.0–46.0)
Hemoglobin: 6.7 g/dL — CL (ref 12.0–15.0)
Hemoglobin: 7.7 g/dL — ABNORMAL LOW (ref 12.0–15.0)
Hemoglobin: 7.7 g/dL — ABNORMAL LOW (ref 12.0–15.0)

## 2022-12-18 LAB — TYPE AND SCREEN: Unit division: 0

## 2022-12-18 LAB — GLUCOSE, CAPILLARY
Glucose-Capillary: 221 mg/dL — ABNORMAL HIGH (ref 70–99)
Glucose-Capillary: 239 mg/dL — ABNORMAL HIGH (ref 70–99)
Glucose-Capillary: 165 mg/dL — ABNORMAL HIGH (ref 70–99)
Glucose-Capillary: 137 mg/dL — ABNORMAL HIGH (ref 70–99)

## 2022-12-18 LAB — PREPARE RBC (CROSSMATCH)

## 2022-12-18 LAB — MAGNESIUM: Magnesium: 1.9 mg/dL (ref 1.7–2.4)

## 2022-12-18 MED ORDER — SODIUM CHLORIDE 0.9 % IV SOLN
1.0000 g | INTRAVENOUS | Status: DC
  Administered 2022-12-18 – 2022-12-19 (×2): 1 g via INTRAVENOUS
  Filled 2022-12-18 (×2): qty 10

## 2022-12-18 MED ORDER — OCTREOTIDE LOAD VIA INFUSION
50.0000 ug | Freq: Once | INTRAVENOUS | Status: AC
  Administered 2022-12-18: 50 ug via INTRAVENOUS
  Filled 2022-12-18: qty 25

## 2022-12-18 MED ORDER — CHLORHEXIDINE GLUCONATE CLOTH 2 % EX PADS
6.0000 | MEDICATED_PAD | Freq: Every day | CUTANEOUS | Status: DC
  Administered 2022-12-18 – 2022-12-20 (×3): 6 via TOPICAL

## 2022-12-18 MED ORDER — CALCIUM CARBONATE ANTACID 500 MG PO CHEW
1.0000 | CHEWABLE_TABLET | Freq: Two times a day (BID) | ORAL | Status: DC
  Administered 2022-12-18: 200 mg via ORAL
  Filled 2022-12-18 (×4): qty 1

## 2022-12-18 MED ORDER — SODIUM CHLORIDE 0.9% IV SOLUTION: Freq: Once | INTRAVENOUS | Status: AC

## 2022-12-18 MED ORDER — PANTOPRAZOLE SODIUM 40 MG IV SOLR: 40.0000 mg | Freq: Two times a day (BID) | INTRAVENOUS | Status: DC

## 2022-12-18 MED ORDER — SODIUM CHLORIDE 0.9 % IV SOLN
50.0000 ug/h | INTRAVENOUS | Status: DC
  Administered 2022-12-18 – 2022-12-20 (×3): 50 ug/h via INTRAVENOUS
  Filled 2022-12-18 (×7): qty 1

## 2022-12-18 MED ORDER — PANTOPRAZOLE SODIUM 40 MG IV SOLR
40.0000 mg | Freq: Two times a day (BID) | INTRAVENOUS | Status: DC
  Administered 2022-12-18 – 2022-12-20 (×4): 40 mg via INTRAVENOUS
  Filled 2022-12-18 (×4): qty 10

## 2022-12-18 MED ORDER — PANTOPRAZOLE SODIUM 40 MG IV SOLR: 40.0000 mg | Freq: Once | INTRAVENOUS | Status: DC

## 2022-12-18 NOTE — TOC Progression Note (Signed)
Transition of Care [facility]) - Progression Note    Patient Details  Name: Rachel Huber MRN: [id number] Date of Birth: 07-27-1956  Transition [facility]) CM/SW Contact  Leitha Bleak, RN Phone Number: 12/18/2022, 1:15 PM  Clinical Narrative:   Patient admitted with symptomatic anemia. HGB unstable, May transfer to [facility]. TOC following for discharge plan and assessment.      Barriers to Discharge: Continued Medical Work up  Expected Discharge Plan and Services       Living arrangements for the past 2 months: Single Family Home       Social Determinants of Health (SDOH) Interventions SDOH Screenings   Food Insecurity: No Food Insecurity (12/17/2022)  Housing: Low Risk  (12/17/2022)  Transportation Needs: No Transportation Needs (12/17/2022)  Utilities: Not At Risk (12/17/2022)  Alcohol Screen: Low Risk  (04/04/2022)  Depression (PHQ2-9): Low Risk  (04/04/2022)  Financial Resource Strain: Low Risk  (04/04/2022)  Physical Activity: Inactive (04/04/2022)  Social Connections: Moderately Isolated (04/04/2022)  Stress: No Stress Concern Present (04/04/2022)  Tobacco Use: Low Risk  (12/18/2022)    Readmission Risk Interventions    11/04/2022   11:32 AM 10/17/2022    9:17 AM 09/18/2022    2:19 PM  Readmission Risk Prevention Plan  Transportation Screening Complete Complete Complete  Medication Review Oceanographer) Complete Complete Complete  PCP or Specialist appointment within 3-5 days of discharge Complete  Complete  HRI or Home Care Consult  Complete Complete  SW Recovery Care/Counseling Consult Complete Complete Complete  Palliative Care Screening Not Applicable Not Applicable Not Applicable  Skilled Nursing Facility Not Applicable Not Applicable Not Applicable

## 2022-12-18 NOTE — Progress Notes (Signed)
Dr. Idelle Leech called me this evening for a consult on this 67 year old lady with decompensated Rachel Huber cirrhosis complicated by recurrent ascites, refractory portal hypertension manifested by recurrent rectal bleeding requiring nearly weekly transfusions admitted to the hospital from oncology here yesterday with a hemoglobin in the 4 range.  She received 4 units of packed RBCs since yesterday.  Subjectively, increase in blood per rectum this evening.  She remains with normal vital signs, ambulatory and is on a regular diet.  She is followed at [facility] and [facility].  History of TIPS with multiple revisions required.  History of superior rectal artery embolization previously which has not alleviated rectal bleeding; by report, splenic artery embolization is being contemplated. For this evening, we cover with octreotide (bolus and infusion).  Prophylactic Rocephin Empiric PPI therapy. Trend H&H.  Target hemoglobin 7.0.  Discussed at length with Dr. Idelle Leech and ICU nursing staff.  We will be by tomorrow morning to formally consult.  Thanks.

## 2022-12-18 NOTE — Progress Notes (Signed)
PROGRESS NOTE    Rachel Huber  ZOX:[id number] DOB: October 08, 1955 DOA: 12/17/2022 PCP: Lauro Regulus, MD    Brief Narrative: This 67 year old female with history of [name] liver cirrhosis with refractory ascites status post TIPS creation (s/p revision x2), thalassemia minor, significant rectal bleeding status post "emborrhoid" procedure 6/23, superior rectal artery embolization x 2, with recurrent grade 4 internal hemorrhoids associated with rectal bleeding requiring frequent transfusions.  Unfortunately despite these procedures she has continued to require near weekly blood transfusions and weekly large volume paracentesis.  Pt had paracentesis 4/30 where 8L of ascitic fluid was removed. Pt is working with [facility] to complete liver transplant workup.  She is working with radiologist Dr. Elby Showers for consideration of partial splenic embolization to help decrease portal hypertension.  Dr. Elby Showers has said that this has been approved by [facility] IR and is being tentatively scheduled.    Pt presented to [facility] for routine visit and noted to be very pale and fatigued. CBC revealed that her Hb was 4.9.  She will require blood transfusion.  Hematology recommends at least 3 units PRBCs with goal of getting hemoglobin greater than 7 and sustaining at greater than 7.  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   This was discussed with her GI (C. Locklear) and IR (D. Suttle) doctors at [facility] and they have recommended that she be transfused now at [facility].  IF she doesn't respond to transfusion then they recommend she transfer to [facility] and Dr. Elby Showers would discuss other options with her for interventions.  They feel this is due to portal hypertension.  She has soft blood pressures but she is otherwise  hemodynamically stable and mentating well.  She will be admitted to [facility] stepdown ICU and will transfuse 3 units PRBC.    Assessment & Plan:   Principal Problem:   Symptomatic anemia Active Problems:   GI bleeding   Depression with anxiety   Rectal bleeding   HLD (hyperlipidemia)   Liver cirrhosis secondary to NASH (HCC)   Thrombocytopenia (HCC)   Chronic kidney disease, stage 3b (HCC)   Thalassemia minor   Type 2 diabetes mellitus with hyperlipidemia (HCC)   Depression   Decompensated hepatic cirrhosis (HCC)   OSA on CPAP   Iron deficiency anemia due to chronic blood loss   History of uterine cancer   Insomnia   Grade IV internal hemorrhoids   Obesity (BMI 30-39.9)   Hypotension   Recurrent symptomatic anemia: Likely sec. to recurrent rectal bleeding from grade 4 internal hemorrhoids exacerbated by portal hypertension. Hb down to 4.9 Hematologist recommends keeping Hb>7 and holding at >7 S/p 3 units PRBC.  Hemoglobin improved to 6.5 S/p 1 more unit PRBC. Continue lasix IV in between PRBC Discussed with her GI and IR at [facility], No need to transfer to [facility] now Transfuse blood at [facility], if that is not effective and Hb still trending down rapidly then transfer to [facility]. Dr. Elby Showers reported that [facility] IR approved for her to have partial splenic embolization to reduce portal hypertension, this is being scheduled for around 5/13 at [facility] Continue to monitor H&H.   Decompensated NASH liver cirrhosis  Patient receives weekly large volume paracentesis She had paracentesis on 4/30 at [facility] where 8L ascitic fluid was removed Resume  home diuretics. Patient working closely with [facility] for liver transplant, has almost completed all of the requirements per pt.   CKD Stage 3b  Renal function holding stable from recent tests Continue to renally dose medications  Recheck BMP in AM    Thrombocytopenia  Secondary to chronic liver disease Hold all heparin products Recheck CBC in AM     Hypotension  multifactorial , exacerbated due to severe anemia Post 4 units PRBC.  Follow-up posttransfusion hemoglobin. Continue midodrine 10 mg 3 times daily   Type 2 DM with renal complications  Diet Controlled. Most recent A1c was 5.5%  Continue  SSI coverage as needed   OSA - nightly CPAP ordered   Grade IV internal hemorrhoids and rectal bleeding Discussed with GI and interventional radiologist. Transfuse as needed. If no improvement, transfer to [facility] Outpatient partial splenic embolization being arranged by Dr. Elby Showers to reduce portal hypertension    DVT prophylaxis:SCDs Code Status: Full code Family Communication: No family at bed side. Disposition Plan:  Status is: Inpatient Remains inpatient appropriate because: Admitted for GI bleeding and blood transfusion.   Consultants:  GI  Procedures: None  Antimicrobials:  Anti-infectives (From admission, onward)    Start     Dose/Rate Route Frequency Ordered Stop   12/18/22 0800  ciprofloxacin (CIPRO) tablet 500 mg        500 mg Oral Daily with breakfast 12/17/22 1304         Subjective: Patient was seen and examined at bedside.  Overnight events noted.  Patient reports doing better. Patient denies any further bleeding.  Hemoglobin still remains low,  getting 1 more unit of PRBC today.  Objective: Vitals:   12/18/22 1100 12/18/22 1200 12/18/22 1230 12/18/22 1300  BP: (!) 123/49 (!) 141/36 (!) 114/44 (!) 124/36  Pulse: 70 77 76 75  Resp: 17 20 17 17   Temp: 98.7 F (37.1 C)  97.8 F (36.6 C)   TempSrc: Oral  Axillary   SpO2: 99% 99% 100% 97%  Weight:      Height:        Intake/Output Summary (Last 24 hours) at 12/18/2022 1349 Last data filed at 12/18/2022 1300 Gross per 24 hour  Intake 2069 ml  Output 2150 ml  Net -81 ml   Filed Weights   12/17/22 1116 12/18/22 0405  Weight: 117.9 kg 122.3 kg    Examination:  General exam: Appears calm and comfortable, deconditioned, not in any acute  distress. Respiratory system: Clear to auscultation. Respiratory effort normal.  RR 15 Cardiovascular system: S1 & S2 heard, RRR. No JVD, murmurs, rubs, gallops or clicks. No pedal edema. Gastrointestinal system: Abdomen is soft, non tender, non distended, BS+ Central nervous system: Alert and oriented X 3. No focal neurological deficits. Extremities: No edema, no cyanosis, no clubbing Skin: No rashes, lesions or ulcers Psychiatry: Judgement and insight appear normal. Mood & affect appropriate.     Data Reviewed: I have personally reviewed following labs and imaging studies  CBC: Recent Labs  Lab 12/17/22 0910 12/18/22 0523 12/18/22 0725  WBC 2.4* 3.6*  --   HGB 4.9* 6.8* 6.7*  HCT 15.6* 20.6* 20.7*  MCV 80.0 78.3*  --   PLT 119* 125*  --    Basic Metabolic Panel: Recent Labs  Lab 12/17/22 1132 12/18/22 0523  NA 134* 136  K 4.1 4.2  CL 109 110  CO2 20* 22  GLUCOSE 182* 175*  BUN 38* 34*  CREATININE 1.70* 1.49*  CALCIUM 7.9* 7.9*  MG  --  1.9   GFR: Estimated Creatinine Clearance: 52.6 mL/min (A) (by C-G formula based on SCr of 1.49 mg/dL (H)). Liver Function Tests: Recent Labs  Lab 12/18/22 0523  AST 33  ALT 18  ALKPHOS 106  BILITOT 1.3*  PROT 3.9*  ALBUMIN 2.1*   No results for input(s): "LIPASE", "AMYLASE" in the last 168 hours. No results for input(s): "AMMONIA" in the last 168 hours. Coagulation Profile: No results for input(s): "INR", "PROTIME" in the last 168 hours. Cardiac Enzymes: No results for input(s): "CKTOTAL", "CKMB", "CKMBINDEX", "TROPONINI" in the last 168 hours. BNP (last 3 results) No results for input(s): "PROBNP" in the last 8760 hours. HbA1C: No results for input(s): "HGBA1C" in the last 72 hours. CBG: Recent Labs  Lab 12/17/22 1130 12/17/22 1559 12/17/22 2014 12/18/22 0739 12/18/22 1115  GLUCAP 152* 127* 128* 221* 239*   Lipid Profile: Recent Labs    12/16/22 1315  CHOL 75  HDL 21*  LDLCALC 42  TRIG 61  CHOLHDL 3.6    Thyroid Function Tests: No results for input(s): "TSH", "T4TOTAL", "FREET4", "T3FREE", "THYROIDAB" in the last 72 hours. Anemia Panel: No results for input(s): "VITAMINB12", "FOLATE", "FERRITIN", "TIBC", "IRON", "RETICCTPCT" in the last 72 hours. Sepsis Labs: No results for input(s): "PROCALCITON", "LATICACIDVEN" in the last 168 hours.  Recent Results (from the past 240 hour(s))  MRSA Next Gen by PCR, Nasal     Status: None   Collection Time: 12/17/22 11:09 AM   Specimen: Nasal Mucosa; Nasal Swab  Result Value Ref Range Status   MRSA by PCR Next Gen NOT DETECTED NOT DETECTED Final    Comment: (NOTE) The GeneXpert MRSA Assay (FDA approved for NASAL specimens only), is one component of a comprehensive MRSA colonization surveillance program. It is not intended to diagnose MRSA infection nor to guide or monitor treatment for MRSA infections. Test performance is not FDA approved in patients less than 80 years old. Performed at [facility], [address] [place], [place] [address]     Radiology Studies: No results found.  Scheduled Meds:  sodium chloride   Intravenous Once   atorvastatin  10 mg Oral QPM   Chlorhexidine Gluconate Cloth  6 each Topical Daily   ciprofloxacin  500 mg Oral Q breakfast   cyanocobalamin  1,000 mcg Oral Daily   gabapentin  400 mg Oral BID   insulin aspart  0-6 Units Subcutaneous TID WC   midodrine  10 mg Oral TID PC   polyethylene glycol  17 g Oral BID   sertraline  50 mg Oral Daily   spironolactone  25 mg Oral Daily   torsemide  40 mg Oral Daily   Continuous Infusions:   LOS: 1 day    Time spent: 50 mins    Willeen Niece, MD [facility]   If 7PM-7AM, please contact night-coverage

## 2022-12-18 NOTE — Plan of Care (Signed)

## 2022-12-18 NOTE — Progress Notes (Signed)
Date and time results received: 12/18/22 0749 (use smartphrase ".now" to insert current time)  Test: HGB  Critical Value: 6.7  Name of Provider Notified: Dr Idelle Leech  Orders Received? Or Actions Taken?:  Waiting on new orders

## 2022-12-19 ENCOUNTER — Inpatient Hospital Stay (HOSPITAL_COMMUNITY): Payer: Medicare Other | Admitting: Certified Registered"

## 2022-12-19 ENCOUNTER — Encounter (HOSPITAL_COMMUNITY): Payer: Self-pay | Admitting: Family Medicine

## 2022-12-19 ENCOUNTER — Encounter (HOSPITAL_COMMUNITY)
Admission: RE | Disposition: A | Discharge status: Home / Self Care | Payer: Self-pay | Source: Ambulatory Visit | Attending: Family Medicine

## 2022-12-19 DIAGNOSIS — K648 Other hemorrhoids: Secondary | ICD-10-CM | POA: Diagnosis not present

## 2022-12-19 DIAGNOSIS — E1122 Type 2 diabetes mellitus with diabetic chronic kidney disease: Secondary | ICD-10-CM

## 2022-12-19 DIAGNOSIS — I129 Hypertensive chronic kidney disease with stage 1 through stage 4 chronic kidney disease, or unspecified chronic kidney disease: Secondary | ICD-10-CM

## 2022-12-19 DIAGNOSIS — K625 Hemorrhage of anus and rectum: Secondary | ICD-10-CM | POA: Diagnosis not present

## 2022-12-19 DIAGNOSIS — D649 Anemia, unspecified: Secondary | ICD-10-CM | POA: Diagnosis not present

## 2022-12-19 DIAGNOSIS — D631 Anemia in chronic kidney disease: Secondary | ICD-10-CM

## 2022-12-19 DIAGNOSIS — N189 Chronic kidney disease, unspecified: Secondary | ICD-10-CM

## 2022-12-19 HISTORY — PX: FLEXIBLE SIGMOIDOSCOPY: SHX5431

## 2022-12-19 LAB — TYPE AND SCREEN
ABO/RH(D): A POS
Antibody Screen: NEGATIVE
Unit division: 0

## 2022-12-19 LAB — GLUCOSE, CAPILLARY
Glucose-Capillary: 127 mg/dL — ABNORMAL HIGH (ref 70–99)
Glucose-Capillary: 194 mg/dL — ABNORMAL HIGH (ref 70–99)
Glucose-Capillary: 283 mg/dL — ABNORMAL HIGH (ref 70–99)

## 2022-12-19 LAB — CBC
HCT: 24.3 % — ABNORMAL LOW (ref 36.0–46.0)
Hemoglobin: 7.9 g/dL — ABNORMAL LOW (ref 12.0–15.0)
MCH: 25.9 pg — ABNORMAL LOW (ref 26.0–34.0)
MCHC: 32.5 g/dL (ref 30.0–36.0)
MCV: 79.7 fL — ABNORMAL LOW (ref 80.0–100.0)
Platelets: 132 10*3/uL — ABNORMAL LOW (ref 150–400)
RBC: 3.05 MIL/uL — ABNORMAL LOW (ref 3.87–5.11)
RDW: 18.6 % — ABNORMAL HIGH (ref 11.5–15.5)
WBC: 3.9 10*3/uL — ABNORMAL LOW (ref 4.0–10.5)
nRBC: 0 % (ref 0.0–0.2)

## 2022-12-19 LAB — BASIC METABOLIC PANEL
Anion gap: 6 (ref 5–15)
BUN: 35 mg/dL — ABNORMAL HIGH (ref 8–23)
CO2: 22 mmol/L (ref 22–32)
Calcium: 7.9 mg/dL — ABNORMAL LOW (ref 8.9–10.3)
Chloride: 110 mmol/L (ref 98–111)
Creatinine, Ser: 1.6 mg/dL — ABNORMAL HIGH (ref 0.44–1.00)
GFR, Estimated: 36 mL/min — ABNORMAL LOW (ref >60)
Glucose, Bld: 135 mg/dL — ABNORMAL HIGH (ref 70–99)
Potassium: 4.3 mmol/L (ref 3.5–5.1)
Sodium: 138 mmol/L (ref 135–145)

## 2022-12-19 LAB — BPAM RBC
Blood Product Expiration Date: 202405242359
ISSUE DATE / TIME: 202405011515
Unit Type and Rh: 6200
Unit Type and Rh: 6200

## 2022-12-19 LAB — MAGNESIUM: Magnesium: 1.6 mg/dL — ABNORMAL LOW (ref 1.7–2.4)

## 2022-12-19 LAB — PHOSPHORUS: Phosphorus: 3 mg/dL (ref 2.5–4.6)

## 2022-12-19 SURGERY — SIGMOIDOSCOPY, FLEXIBLE
Anesthesia: General

## 2022-12-19 MED ORDER — LIDOCAINE HCL (CARDIAC) PF 100 MG/5ML IV SOSY
PREFILLED_SYRINGE | INTRAVENOUS | Status: DC | PRN
  Administered 2022-12-19: 50 mg via INTRAVENOUS

## 2022-12-19 MED ORDER — MAGNESIUM SULFATE 2 GM/50ML IV SOLN: 2.0000 g | Freq: Once | INTRAVENOUS | Status: DC

## 2022-12-19 MED ORDER — OCTREOTIDE ACETATE 500 MCG/ML IJ SOLN
INTRAMUSCULAR | Status: AC
  Filled 2022-12-19: qty 1

## 2022-12-19 MED ORDER — MELATONIN 3 MG PO TABS
9.0000 mg | ORAL_TABLET | Freq: Once | ORAL | Status: DC
  Filled 2022-12-19: qty 3

## 2022-12-19 MED ORDER — PROPOFOL 10 MG/ML IV BOLUS
INTRAVENOUS | Status: DC | PRN
  Administered 2022-12-19: 40 mg via INTRAVENOUS
  Administered 2022-12-19: 80 mg via INTRAVENOUS
  Administered 2022-12-19: 30 mg via INTRAVENOUS

## 2022-12-19 MED ORDER — LACTATED RINGERS IV SOLN: INTRAVENOUS | Status: DC

## 2022-12-19 MED ORDER — MAGNESIUM SULFATE 2 GM/50ML IV SOLN
2.0000 g | Freq: Once | INTRAVENOUS | Status: AC
  Administered 2022-12-19: 2 g via INTRAVENOUS
  Filled 2022-12-19: qty 50

## 2022-12-19 MED ORDER — SODIUM CHLORIDE 0.9 % IV SOLN: INTRAVENOUS | Status: DC
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
## 2022-12-19 NOTE — Progress Notes (Signed)
Nurse callled to receive report on pt before flex sig. Nurse preparing to do tap water enema. Dr Levon Hedger ok with 1 enema per Dr Johnnette Litter.

## 2022-12-19 NOTE — Consult Note (Signed)
Gastroenterology Consult   Referring Provider: Carnella Guadalajara, * Primary Care Physician:  Lauro Regulus, MD Primary Gastroenterologist:  Dr. Mia Creek [facility])  Patient ID: Rachel Huber; [id number]; April 26, 1956   Admit date: 12/17/2022  LOS: 2 days   Date of Consultation: 12/19/2022  Reason for Consultation:  rectal bleeding  History of Present Illness   Rachel Huber is a 67 y.o. 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  Her cirrhosis course has also been complicated by rectal bleeding secondary to portal hypertension.  She has recurrent grade 4 hemorrhoids associated with bleeding in the past, superior rectal artery embolization x 2.  Recently underwent TIPS revision at [facility] in January 2024.  Last superior rectal artery embolization 11/05/2022.  She follows regularly with hematology for multifactorial anemia and thalassemia minor and has been receiving regular blood transfusions.  She was directly admitted from hematology due to hemoglobin of 4.9 and ongoing severe rectal bleeding.  GI consulted to assist with evaluation and management of rectal bleeding.   She was transfused 3 units PRBCs yesterday afternoon.  She was admitted for transfusion given she was symptomatic and given that she has antibodies to blood.  Rojelio Brenner, PA discussed patient's condition with her primary GI at [facility] clinic and Dr. Elby Showers with IR who recommended transfusion and to hold off on transfer to [facility].  She is tentatively planned for partial splenic embolization to reduce portal hypertension on 5/13 at [facility], this was approved by [facility] transplant and IR.  Admitting hospitalist consulted with Dr. Jena Gauss last night who recommended prophylactic Rocephin for SBP, empiric PPI, octreotide, and to keep npo.   Hgb trend: 4.9>>6.8>>6.7>>7.7>>7.9 (after  3u PRBC).   She denies any shortness of breath, chest pain, melena, lack of appetite.  She states that she has had rectal bleeding for a while.  She has had 4 attempted colonoscopies, states she has only been able to tolerate pills for full preps.  Rectal bleeding began increasing for the last couple of days.  States she had a bowel movement overnight around 2 AM that was nonbloody.  States it is bright red or dark red in nature without clots.  She denies any abdominal pain.  Swelling appears to be controlled.  Has mild tenderness to site of her prior paracentesis this past Tuesday.  EGD February 2022: -Normal esophagus -Gastric mucosal variant s/p biopsy -Erythematous duodenopathy -Gastric biopsies with mild chronic inactive gastritis and features of reactive gastropathy  Colonoscopy February 2022: -Poor prep of colon -2 mm cecal polyp -6 mm cecal polyp -Congested mucosa in the rectum and sigmoid colon s/p biopsy -non bleeding internal hemorrhoids -Cecal polyp with multiple fragments of tubular adenoma -Benign colonic mucosa with superficial reactive changes -Repeat colonoscopy in 6-12 months  Colonoscopy April 2022: -Inadequate colon prep, stool in the entire colon -2 mm polyp in the cecum (tubular adenoma) -Hemorrhoids  Flexible sigmoidoscopy June 2023: -Fair prep of colon - prolapsed hemorrhoids on perianal exam -Internal hemorrhoids  Colonoscopy November 2023: -Hemorrhoids on perianal exam -Grade 4 internal hemorrhoids -Congested mucosa in the rectum, rectosigmoid, sigmoid   Past Medical History:  Diagnosis Date   Anemia in chronic kidney disease (CKD) 02/07/2022   Anxiety    Arthritis    Ascites of liver    Weekly paracentesis   Cirrhosis of liver (HCC)    CKD (chronic kidney disease)  stage 4, GFR 15-29 ml/min (HCC)    GERD (gastroesophageal reflux disease)    Grade IV internal hemorrhoids    Hypertension    NASH (nonalcoholic steatohepatitis)    Neuropathy,  diabetic (HCC)    Obesity (BMI 30-39.9)    OSA on CPAP    Pneumonia    Portal hypertension (HCC)    S/P TIPS (transjugular intrahepatic portosystemic shunt)    Thalassemia minor 1992    Past Surgical History:  Procedure Laterality Date   ABDOMINAL HYSTERECTOMY     BREAST BIOPSY Right 02/15/2018   [place] bx 6-6:30 ribbon shape, ONE CORE FRAGMENT WITH FIBROSIS. ONE CORE FRAGMENT WITH PORTION OF A DILATED   BREAST BIOPSY Right 02/15/2018   [place] bx 9:00 heart shape, USUAL DUCTAL HYPERPLASIA   BREAST LUMPECTOMY Right 03/09/2018   Procedure: BREAST LUMPECTOMY x 2;  Surgeon: Sung Amabile, DO;  Location: [facility] ORS;  Service: General;  Laterality: Right;   CATARACT EXTRACTION W/PHACO Left 06/11/2016   Procedure: CATARACT EXTRACTION PHACO AND INTRAOCULAR LENS PLACEMENT (IOC);  Surgeon: Sallee Lange, MD;  Location: [facility] ORS;  Service: Ophthalmology;  Laterality: Left;  Lot # [id number] H US:01:38.6 AP%:26.4 CDE:44.15   CATARACT EXTRACTION W/PHACO Right 06/15/2018   Procedure: CATARACT EXTRACTION PHACO AND INTRAOCULAR LENS PLACEMENT (IOC);  Surgeon: Galen Manila, MD;  Location: [facility] ORS;  Service: Ophthalmology;  Laterality: Right;  [place] 00:38.2 CDE 4.23 Fluid Pack Lot # [id number]   COLONOSCOPIES     COLONOSCOPY WITH PROPOFOL N/A 10/10/2020   Procedure: COLONOSCOPY WITH PROPOFOL;  Surgeon: Regis Bill, MD;  Location: [facility] ENDOSCOPY;  Service: Endoscopy;  Laterality: N/A;   COLONOSCOPY WITH PROPOFOL N/A 11/20/2020   Procedure: COLONOSCOPY WITH PROPOFOL;  Surgeon: Regis Bill, MD;  Location: [facility] ENDOSCOPY;  Service: Endoscopy;  Laterality: N/A;  DM STAT CBC, BMP COVID POSITIVE 09/02/2020   COLONOSCOPY WITH PROPOFOL N/A 06/19/2022   Procedure: COLONOSCOPY WITH PROPOFOL;  Surgeon: Regis Bill, MD;  Location: [facility] ENDOSCOPY;  Service: Endoscopy;  Laterality: N/A;   CTR     ESOPHAGOGASTRODUODENOSCOPY (EGD) WITH PROPOFOL N/A 10/10/2020   Procedure: ESOPHAGOGASTRODUODENOSCOPY (EGD)  WITH PROPOFOL;  Surgeon: Regis Bill, MD;  Location: [facility] ENDOSCOPY;  Service: Endoscopy;  Laterality: N/A;  COVID POSITIVE 10/08/2020   FLEXIBLE SIGMOIDOSCOPY N/A 02/06/2022   Procedure: FLEXIBLE SIGMOIDOSCOPY;  Surgeon: Regis Bill, MD;  Location: [facility] ENDOSCOPY;  Service: Endoscopy;  Laterality: N/A;  Patient requests anesthesia   IR ANGIOGRAM SELECTIVE EACH ADDITIONAL VESSEL  02/07/2022   IR ANGIOGRAM SELECTIVE EACH ADDITIONAL VESSEL  02/07/2022   IR ANGIOGRAM SELECTIVE EACH ADDITIONAL VESSEL  09/05/2022   IR ANGIOGRAM VISCERAL SELECTIVE  02/07/2022   IR ANGIOGRAM VISCERAL SELECTIVE  09/05/2022   IR EMBO ART  VEN HEMORR LYMPH EXTRAV  INC GUIDE ROADMAPPING  09/05/2022   IR EMBO ART  VEN HEMORR LYMPH EXTRAV  INC GUIDE ROADMAPPING  11/05/2022   IR EMBO ARTERIAL NOT HEMORR HEMANG INC GUIDE ROADMAPPING  02/07/2022   IR IMAGING GUIDED PORT INSERTION  06/13/2022   IR IMAGING GUIDED PORT INSERTION  11/25/2022   IR PARACENTESIS  12/17/2021   IR PARACENTESIS  03/25/2022   IR PARACENTESIS  11/25/2022   IR RADIOLOGIST EVAL & MGMT  03/18/2022   IR RADIOLOGIST EVAL & MGMT  05/07/2022   IR RADIOLOGIST EVAL & MGMT  05/16/2022   IR RADIOLOGIST EVAL & MGMT  10/13/2022   IR RADIOLOGIST EVAL & MGMT  12/12/2022   IR US GUIDE VASC ACCESS RIGHT  02/07/2022   IR  US GUIDE VASC ACCESS RIGHT  09/05/2022   JOINT REPLACEMENT     KNEE SURGERY Right 09/24/2017   plates and pins   PORT-A-CATH REMOVAL N/A 09/23/2022   Procedure: MINOR REMOVAL PORT-A-CATH;  Surgeon: Franky Macho, MD;  Location: [facility] ORS;  Service: General;  Laterality: N/A;   TEE WITHOUT CARDIOVERSION N/A 09/23/2022   Procedure: TRANSESOPHAGEAL ECHOCARDIOGRAM (TEE);  Surgeon: Antoine Poche, MD;  Location: [facility] ORS;  Service: Endoscopy;  Laterality: N/A;   TOTAL SHOULDER ARTHROPLASTY Right 09/27/2015    Prior to Admission medications   Medication Sig Start Date End Date Taking? Authorizing Provider  acetaminophen (TYLENOL) 650 MG CR tablet Take 1,300 mg by  mouth daily as needed for pain.   Yes [provider]  albuterol (VENTOLIN HFA) 108 (90 Base) MCG/ACT inhaler Inhale 2 puffs into the lungs every 6 (six) hours as needed for wheezing or shortness of breath. 02/10/22  Yes Wieting, Richard, MD  atorvastatin (LIPITOR) 10 MG tablet Take 1 tablet (10 mg total) by mouth daily. 06/20/22  Yes Enedina Finner, MD  cyanocobalamin (VITAMIN B12) 1000 MCG tablet Take 1 tablet (1,000 mcg total) by mouth daily. 12/17/22  Yes Pennington, Rebekah M, PA-C  diphenhydrAMINE (BENADRYL) 25 MG tablet Take 25 mg by mouth 2 (two) times daily as needed for itching.   Yes [provider]  ergocalciferol (VITAMIN D2) 1.25 MG (50000 UT) capsule Take 1 capsule (50,000 Units total) by mouth every 14 (fourteen) days. 09/01/22  Yes Pennington, Rebekah M, PA-C  gabapentin (NEURONTIN) 400 MG capsule Take 400 mg by mouth 2 (two) times daily. 01/07/18  Yes [provider]  melatonin 5 MG TABS Take 10 mg by mouth at bedtime as needed (sleep).   Yes [provider]  midodrine (PROAMATINE) 10 MG tablet Take 10 mg by mouth 3 (three) times daily.   Yes [provider]  Multiple Vitamins-Minerals (MULTIVITAMIN WITH MINERALS) tablet Take 1 tablet by mouth daily.   Yes [provider]  oxymetazoline (AFRIN) 0.05 % nasal spray Place 1 spray into both nostrils daily as needed for congestion.   Yes [provider]  polyethylene glycol (MIRALAX / GLYCOLAX) 17 g packet Take 17 g by mouth 2 (two) times daily. 06/20/22  Yes Enedina Finner, MD  sertraline (ZOLOFT) 50 MG tablet Take 50 mg by mouth daily. 12/12/17  Yes [provider]  spironolactone (ALDACTONE) 25 MG tablet Take 1 tablet (25 mg total) by mouth daily. 10/26/22  Yes Tat, Onalee Hua, MD  torsemide 40 MG TABS Take 40 mg by mouth daily. 10/26/22  Yes TatOnalee Hua, MD  ciprofloxacin (CIPRO) 500 MG tablet Take 500 mg by mouth daily with breakfast. Patient not taking: Reported on 12/17/2022     [provider]    Current Facility-Administered Medications  Medication Dose Route Frequency Provider Last Rate Last Admin   0.9 %  sodium chloride infusion (Manually program via Guardrails IV Fluids)   Intravenous Once Johnson, Clanford L, MD       acetaminophen (TYLENOL) tablet 650 mg  650 mg Oral Q6H PRN Johnson, Clanford L, MD   650 mg at 12/18/22 0328   albuterol (PROVENTIL) (2.5 MG/3ML) 0.083% nebulizer solution 2.5 mg  2.5 mg Inhalation Q6H PRN Johnson, Clanford L, MD       atorvastatin (LIPITOR) tablet 10 mg  10 mg Oral QPM Johnson, Clanford L, MD   10 mg at 12/18/22 1704   calcium carbonate (TUMS - dosed in mg elemental calcium) chewable tablet 200  mg of elemental calcium  1 tablet Oral BID Willeen Niece, MD   200 mg of elemental calcium at 12/18/22 1705   cefTRIAXone (ROCEPHIN) 1 g in sodium chloride 0.9 % 100 mL IVPB  1 g Intravenous Q24H Corbin Ade, MD 200 mL/hr at 12/18/22 1951 1 g at 12/18/22 1951   Chlorhexidine Gluconate Cloth 2 % PADS 6 each  6 each Topical Daily Willeen Niece, MD   6 each at 12/18/22 0908   cyanocobalamin (VITAMIN B12) tablet 1,000 mcg  1,000 mcg Oral Daily Laural Benes, Clanford L, MD   1,000 mcg at 12/18/22 1610   diphenhydrAMINE (BENADRYL) capsule 25 mg  25 mg Oral BID PRN Johnson, Clanford L, MD       gabapentin (NEURONTIN) capsule 400 mg  400 mg Oral BID Johnson, Clanford L, MD   400 mg at 12/18/22 2140   insulin aspart (novoLOG) injection 0-6 Units  0-6 Units Subcutaneous TID WC Johnson, Clanford L, MD   1 Units at 12/18/22 1704   melatonin tablet 10 mg  10 mg Oral QHS PRN Johnson, Clanford L, MD       midodrine (PROAMATINE) tablet 10 mg  10 mg Oral TID PC Johnson, Clanford L, MD   10 mg at 12/18/22 1704   octreotide (SANDOSTATIN) 500 mcg in sodium chloride 0.9 % 250 mL (2 mcg/mL) infusion  50 mcg/hr Intravenous Continuous Corbin Ade, MD 25 mL/hr at 12/19/22 0437 50 mcg/hr at 12/19/22 0437   ondansetron (ZOFRAN) tablet 4 mg  4 mg Oral Q6H  PRN Johnson, Clanford L, MD       Or   ondansetron (ZOFRAN) injection 4 mg  4 mg Intravenous Q6H PRN Johnson, Clanford L, MD   4 mg at 12/18/22 2032   oxymetazoline (AFRIN) 0.05 % nasal spray 1 spray  1 spray Each Nare Daily PRN Johnson, Clanford L, MD       pantoprazole (PROTONIX) injection 40 mg  40 mg Intravenous Q12H Corbin Ade, MD   40 mg at 12/18/22 2141   polyethylene glycol (MIRALAX / GLYCOLAX) packet 17 g  17 g Oral BID Johnson, Clanford L, MD   17 g at 12/18/22 9604   sertraline (ZOLOFT) tablet 50 mg  50 mg Oral Daily Johnson, Clanford L, MD   50 mg at 12/18/22 5409   spironolactone (ALDACTONE) tablet 25 mg  25 mg Oral Daily Johnson, Clanford L, MD   25 mg at 12/18/22 8119   torsemide (DEMADEX) tablet 40 mg  40 mg Oral Daily Johnson, Clanford L, MD   40 mg at 12/18/22 1478   Facility-Administered Medications Ordered in Other Encounters  Medication Dose Route Frequency Provider Last Rate Last Admin   diphenhydrAMINE (BENADRYL) capsule 25 mg  25 mg Oral Once Pennington, Rebekah M, PA-C        Allergies as of 12/17/2022 - Review Complete 12/17/2022  Allergen Reaction Noted   Gramineae pollens Other (See Comments) 10/23/2021   Latex Rash 09/02/2016    Family History  Problem Relation Age of Onset   Breast cancer Mother 75   Lymphoma Mother    Diabetes Father    Kidney cancer Father    Heart disease Father    Diabetes Sister    Breast cancer Sister 13    Social History   Socioeconomic History   Marital status: Married    Spouse name: Not on file   Number of children: 0   Years of education: Not on file   Highest education  level: Not on file  Occupational History   Occupation: EMPLOYED  Tobacco Use   Smoking status: Never   Smokeless tobacco: Never  Vaping Use   Vaping Use: Never used  Substance and Sexual Activity   Alcohol use: No   Drug use: Never   Sexual activity: Not Currently  Other Topics Concern   Not on file  Social History Narrative   Not on  file   Social Determinants of Health   Financial Resource Strain: Low Risk  (04/04/2022)   Overall Financial Resource Strain (CARDIA)    Difficulty of Paying Living Expenses: Not hard at all  Food Insecurity: No Food Insecurity (12/17/2022)   Hunger Vital Sign    Worried About Running Out of Food in the Last Year: Never true    Ran Out of Food in the Last Year: Never true  Transportation Needs: No Transportation Needs (12/17/2022)   PRAPARE - Administrator, Civil Service (Medical): No    Lack of Transportation (Non-Medical): No  Physical Activity: Inactive (04/04/2022)   Exercise Vital Sign    Days of Exercise per Week: 0 days    Minutes of Exercise per Session: 0 min  Stress: No Stress Concern Present (04/04/2022)   [employer] of Occupational Health - Occupational Stress Questionnaire    Feeling of Stress : Only a little  Social Connections: Moderately Isolated (04/04/2022)   Social Connection and Isolation Panel [NHANES]    Frequency of Communication with Friends and Family: More than three times a week    Frequency of Social Gatherings with Friends and Family: Three times a week    Attends Religious Services: Never    Active Member of Clubs or Organizations: No    Attends Banker Meetings: Never    Marital Status: Married  Catering manager Violence: Not At Risk (12/17/2022)   Humiliation, Afraid, Rape, and Kick questionnaire    Fear of Current or Ex-Partner: No    Emotionally Abused: No    Physically Abused: No    Sexually Abused: No     Review of Systems   Gen: Denies any fever, chills, loss of appetite, change in weight or weight loss CV: Denies chest pain, heart palpitations, syncope, edema  Resp: Denies shortness of breath with rest, cough, wheezing, coughing up blood, and pleurisy. GI: Denies vomiting blood, jaundice, and fecal incontinence.   Denies dysphagia or odynophagia. GU : Denies urinary burning, blood in urine, urinary frequency, and  urinary incontinence. MS: Denies joint pain, limitation of movement, swelling, cramps, and atrophy.  Derm: Denies rash, itching, dry skin, hives. Psych: Denies depression, anxiety, memory loss, hallucinations, and confusion. Heme: Denies bruising or bleeding Neuro:  Denies any headaches, dizziness, paresthesias, shaking  Physical Exam   Vital Signs in last 24 hours: Temp:  [97.8 F (36.6 C)-98.7 F (37.1 C)] 98.1 F (36.7 C) (05/03 0456) Pulse Rate:  [67-77] 76 (05/03 0700) Resp:  [12-21] 21 (05/03 0700) BP: (95-141)/(26-71) 119/39 (05/03 0700) SpO2:  [90 %-100 %] 95 % (05/03 0700) Weight:  [119.8 kg] 119.8 kg (05/03 0456) Last BM Date : 12/18/22  General:   Alert,  Well-developed, well-nourished, pleasant and cooperative in NAD Head:  Normocephalic and atraumatic. Eyes:  Sclera clear, no icterus.   Conjunctiva pink. Ears:  Normal auditory acuity. Neck:  Supple; no masses Lungs:  Clear throughout to auscultation.   No wheezes, crackles, or rhonchi. No acute distress. Heart:  Regular rate and rhythm; no murmurs, clicks, rubs,  or  gallops. Abdomen:  Soft, nontender and nondistended. No masses, hepatosplenomegaly or hernias noted. Normal bowel sounds, without guarding, and without rebound.   Rectal: deferred   Msk:  Symmetrical without gross deformities. Normal posture. Extremities:  Non pitting edema. Stasis dermatitis noted.  Neurologic:  Alert and  oriented x4. Skin:  Intact without significant lesions or rashes. Psych:  Alert and cooperative. Normal mood and affect.  Intake/Output from previous day: 05/02 0701 - 05/03 0700 In: 845.6 [P.O.:240; I.V.:211.6; Blood:294; IV Piggyback:100] Out: 2100 [Urine:2100] Intake/Output this shift: No intake/output data recorded.  Labs/Studies   Recent Labs Recent Labs    12/17/22 0910 12/18/22 0523 12/18/22 0725 12/18/22 1354 12/18/22 1834 12/19/22 0413  WBC 2.4* 3.6*  --   --   --  3.9*  HGB 4.9* 6.8*   < > 7.7* 7.7* 7.9*   HCT 15.6* 20.6*   < > 23.9* 23.4* 24.3*  PLT 119* 125*  --   --   --  132*   < > = values in this interval not displayed.   BMET Recent Labs    12/17/22 1132 12/18/22 0523 12/19/22 0413  NA 134* 136 138  K 4.1 4.2 4.3  CL 109 110 110  CO2 20* 22 22  GLUCOSE 182* 175* 135*  BUN 38* 34* 35*  CREATININE 1.70* 1.49* 1.60*  CALCIUM 7.9* 7.9* 7.9*   LFT Recent Labs    12/18/22 0523  PROT 3.9*  ALBUMIN 2.1*  AST 33  ALT 18  ALKPHOS 106  BILITOT 1.3*   PT/INR No results for input(s): "LABPROT", "INR" in the last 72 hours. Hepatitis Panel No results for input(s): "HEPBSAG", "HCVAB", "HEPAIGM", "HEPBIGM" in the last 72 hours. C-Diff No results for input(s): "CDIFFTOX" in the last 72 hours.  Radiology/Studies No results found.   Assessment   Rachel Huber is a 67 y.o. year old female with history of HTN, thalassemia minor, CKD stage IV, anemia, and decompensated NASH cirrhosis with refractory ascites and rectal bleeding s/p TIPS placement x 2 and superior rectal artery embolization x 2 with recurrent need for paracenteses and recurrent rectal bleeding.  She is following with [facility] for liver transplant evaluation.  Patient admitted directly from heme/onc for symptomatic anemia. GI consulted to assist in evaluation and management.  Decompensated NASH cirrhosis with rectal bleeding and severe anemia: Her course has been complicated by recurrent ascites s/p TIPS placement x 2 with most recent revision at [facility] in January 2024.  Currently requiring regular large-volume paracentesis.  Given her chronic kidney disease she is on renally dosed diuretics.  Due to recurrent rectal bleeding secondary to her portal hypertension she has required superior rectal artery embolization x 2.  She continues to experience recurrent rectal bleeding and has been following regularly with Dr. Elby Showers with IR at [facility]  She has plans to undergo partial splenic embolization to help decrease portal hypertension  on 12/29/2022. Hgb 4.9 on admission with rectal bleeding.  Her primary GI and IR recommended admission for transfusion and monitoring of hemoglobin with plan to transfer to [facility] if not able to stabilize with transfusion.  Her hemoglobin has improved from 4.9-7.9 after 4 units PRBCs.  She remains hemodynamically stable.  No rectal bleeding since yesterday afternoon, previous rectal bleeding bright red/dark red in nature.  Does admit to large hemorrhoids. Will evaluate with flex sig today to assess cause of rectal bleeding. Possibly secondary to her hemorrhoids in the setting of portal hypertension.  Most recent MELD 3.0 score: 21 (11/17/2022).  Patient recently seen by IR and received approval from [facility] to undergo partial splenic artery embolization for treatment of portal hypertension to help reduce recurrence of rectal bleeding.  Prior notes state this is to occur on 5/13 however patient reports she is actually having a dental procedure this day.  Will reach out to Dr. Elby Showers who she has been following with to clarify potential timing of procedure.  Patient reported she has almost completed.  She needs for transplant workup.  She is due to have 2 teeth removed and then she has completed all necessary tasks.  Plan / Recommendations   NPO Tap water enema x2 Flexible sigmoidoscopy today with Dr. Levon Hedger Trend H/H, transfuse for hgb <7.  Given the presence of antibodies, she should keep 2 units ahead. Continue octreotide infusion Continue PPI BID Continue Rocephin for SBP prophylaxis Continue midodrine Continue home diuretics (torsemide 40 mg daily and spironolactone 25 mg daily) Will reach out to Dr. Elby Showers to clarify timing of partial splenic artery embolization given patient states she has not been notified of a procedure date.       12/19/2022, 8:44 AM  Brooke Bonito, MSN, FNP-BC, AGACNP-BC [facility]

## 2022-12-19 NOTE — Progress Notes (Signed)
PROGRESS NOTE    Rachel Huber  ZOX:[id number] DOB: 04/13/1956 DOA: 12/17/2022 PCP: Lauro Regulus, MD    Brief Narrative: This 67 year old female with history of [name] liver cirrhosis with refractory ascites status post TIPS creation (s/p revision x2), thalassemia minor, significant rectal bleeding status post "emborrhoid" procedure 6/23, superior rectal artery embolization x 2, with recurrent grade 4 internal hemorrhoids associated with rectal bleeding requiring frequent transfusions.  Unfortunately despite these procedures she has continued to require near weekly blood transfusions and weekly large volume paracentesis.  Pt had paracentesis 4/30 where 8L of ascitic fluid was removed. Pt is working with [facility] to complete liver transplant workup.  She is working with radiologist Dr. Elby Showers for consideration of partial splenic embolization to help decrease portal hypertension.  Dr. Elby Showers has said that this has been approved by [facility] IR and is being tentatively scheduled.    Pt presented to [facility] for routine visit and noted to be very pale and fatigued. CBC revealed that her Hb was 4.9.  She will require blood transfusion.  Hematology recommends at least 3 units PRBCs with goal of getting hemoglobin greater than 7 and sustaining at greater than 7.  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   This was discussed with her GI (C. Locklear) and IR (D. Suttle) doctors at [facility] and they have recommended that she be transfused now at [facility].  IF she doesn't respond to transfusion then they recommend she transfer to [facility] and Dr. Elby Showers would discuss other options with her for interventions.  They feel this is due to portal hypertension.  She has soft blood pressures but she is otherwise  hemodynamically stable and mentating well.  She will be admitted to [facility] stepdown ICU and will transfuse 3 units PRBC.    Assessment & Plan:   Principal Problem:   Symptomatic anemia Active Problems:   GI bleeding   Depression with anxiety   Rectal bleeding   HLD (hyperlipidemia)   Liver cirrhosis secondary to NASH (HCC)   Thrombocytopenia (HCC)   Chronic kidney disease, stage 3b (HCC)   Thalassemia minor   Type 2 diabetes mellitus with hyperlipidemia (HCC)   Depression   Decompensated hepatic cirrhosis (HCC)   OSA on CPAP   Iron deficiency anemia due to chronic blood loss   History of uterine cancer   Insomnia   Grade IV internal hemorrhoids   Obesity (BMI 30-39.9)   Hypotension   Recurrent symptomatic anemia: Likely sec. to recurrent rectal bleeding from grade 4 internal hemorrhoids exacerbated by portal hypertension. Hb down to 4.9 on admission Hematologist recommends keeping Hb >7 and holding at >7 S/p 4 units PRBC.  Hemoglobin improved to 7.7 Continue lasix IV in between PRBC Discussed with her GI and IR at [facility], No need to transfer to [facility] now Transfuse blood at [facility], if that is not effective and Hb still trending down rapidly then transfer to [facility]. Dr. Elby Showers reported that [facility] IR approved for her to have partial splenic embolization to reduce portal hypertension, this is being scheduled for around 5/13 at [facility] Continue to monitor H&H. GI consulted @ [facility],  She underwent flexible sigmoidoscopy 5/3, she is found to have large external hemorrhoids, internal hemorrhoids.  GI recommended to follow-up IR for partial splenic  embolization is a scheduled.   Decompensated NASH liver cirrhosis  Patient receives weekly large volume paracentesis. She had paracentesis on 4/30 at [facility] where 8L ascitic fluid was removed Resume home diuretics. Patient working closely with [facility] for liver transplant, has almost completed all of the requirements per pt.   CKD Stage 3b  Renal  function holding stable from recent tests. Continue to renally dose medications  Recheck BMP in AM    Thrombocytopenia  Secondary to chronic liver disease. Hold all heparin products Recheck CBC in AM    Hypotension  multifactorial , exacerbated due to severe anemia Post 4 units PRBC.  Follow-up posttransfusion hemoglobin. Hb 7.9 Continue midodrine 10 mg 3 times daily   Type 2 DM with renal complications  Diet Controlled. Most recent A1c was 5.5%  Continue  SSI coverage as needed   OSA - nightly CPAP ordered   Grade IV internal hemorrhoids and rectal bleeding Discussed with GI and interventional radiologist. Transfuse as needed. If no improvement, transfer to [facility]. Outpatient partial splenic embolization being arranged by Dr. Elby Showers to reduce portal hypertension    DVT prophylaxis:SCDs Code Status: Full code Family Communication: No family at bed side. Disposition Plan:  Status is: Inpatient Remains inpatient appropriate because: Admitted for GI bleeding and blood transfusion.   Consultants:  GI  Procedures: None  Antimicrobials:  Anti-infectives (From admission, onward)    Start     Dose/Rate Route Frequency Ordered Stop   12/18/22 1945  cefTRIAXone (ROCEPHIN) 1 g in sodium chloride 0.9 % 100 mL IVPB        1 g 200 mL/hr over 30 Minutes Intravenous Every 24 hours 12/18/22 1855     12/18/22 0800  ciprofloxacin (CIPRO) tablet 500 mg  Status:  Discontinued        500 mg Oral Daily with breakfast 12/17/22 1304 12/18/22 1436       Subjective: Patient was seen and examined at bedside.  Overnight events noted.  Patient reports doing better. Patient underwent flexible sigmoidoscopy found to have internal and external hemorrhoids.  H&H remains stable.  Objective: Vitals:   12/19/22 1136 12/19/22 1210 12/19/22 1215 12/19/22 1223  BP: (!) 110/45 (!) 90/59 (!) 99/53   Pulse: 71 72 74 73  Resp: 18 17 15 15   Temp: 98.3 F (36.8 C) 97.6 F (36.4 C)    TempSrc: Oral      SpO2: 100% 100% 100% 100%  Weight: 120.2 kg     Height: 5\' 9"  (1.753 m)       Intake/Output Summary (Last 24 hours) at 12/19/2022 1325 Last data filed at 12/19/2022 1201 Gross per 24 hour  Intake 511.57 ml  Output 950 ml  Net -438.43 ml   Filed Weights   12/18/22 0405 12/19/22 0456 12/19/22 1136  Weight: 122.3 kg 119.8 kg 120.2 kg    Examination:  General exam: Appears comfortable, deconditioned, not in any acute distress. Respiratory system: Clear to auscultation. Respiratory effort normal.  RR 14 Cardiovascular system: S1-S2 heard, regular rate and rhythm, no murmur. Gastrointestinal system: Abdomen is soft, non tender, non distended, BS+ Central nervous system: Alert and oriented X 3. No focal neurological deficits. Extremities: No edema, no cyanosis, no clubbing Skin: No rashes, lesions or ulcers Psychiatry: Judgement and insight appear normal. Mood & affect appropriate.     Data Reviewed: I have personally reviewed following labs and imaging studies  CBC: Recent Labs  Lab 12/17/22 0910 12/18/22 0523 12/18/22 0725 12/18/22 1354 12/18/22 1834 12/19/22 0413  WBC 2.4* 3.6*  --   --   --  3.9*  HGB 4.9* 6.8* 6.7* 7.7* 7.7* 7.9*  HCT 15.6* 20.6* 20.7* 23.9* 23.4* 24.3*  MCV 80.0 78.3*  --   --   --  79.7*  PLT 119* 125*  --   --   --  132*   Basic Metabolic Panel: Recent Labs  Lab 12/17/22 1132 12/18/22 0523 12/19/22 0413  NA 134* 136 138  K 4.1 4.2 4.3  CL 109 110 110  CO2 20* 22 22  GLUCOSE 182* 175* 135*  BUN 38* 34* 35*  CREATININE 1.70* 1.49* 1.60*  CALCIUM 7.9* 7.9* 7.9*  MG  --  1.9 1.6*  PHOS  --   --  3.0   GFR: Estimated Creatinine Clearance: 48.6 mL/min (A) (by C-G formula based on SCr of 1.6 mg/dL (H)). Liver Function Tests: Recent Labs  Lab 12/18/22 0523  AST 33  ALT 18  ALKPHOS 106  BILITOT 1.3*  PROT 3.9*  ALBUMIN 2.1*   No results for input(s): "LIPASE", "AMYLASE" in the last 168 hours. No results for input(s): "AMMONIA" in  the last 168 hours. Coagulation Profile: No results for input(s): "INR", "PROTIME" in the last 168 hours. Cardiac Enzymes: No results for input(s): "CKTOTAL", "CKMB", "CKMBINDEX", "TROPONINI" in the last 168 hours. BNP (last 3 results) No results for input(s): "PROBNP" in the last 8760 hours. HbA1C: No results for input(s): "HGBA1C" in the last 72 hours. CBG: Recent Labs  Lab 12/18/22 0739 12/18/22 1115 12/18/22 1631 12/18/22 2047 12/19/22 0801  GLUCAP 221* 239* 165* 137* 127*   Lipid Profile: No results for input(s): "CHOL", "HDL", "LDLCALC", "TRIG", "CHOLHDL", "LDLDIRECT" in the last 72 hours.  Thyroid Function Tests: No results for input(s): "TSH", "T4TOTAL", "FREET4", "T3FREE", "THYROIDAB" in the last 72 hours. Anemia Panel: No results for input(s): "VITAMINB12", "FOLATE", "FERRITIN", "TIBC", "IRON", "RETICCTPCT" in the last 72 hours. Sepsis Labs: No results for input(s): "PROCALCITON", "LATICACIDVEN" in the last 168 hours.  Recent Results (from the past 240 hour(s))  MRSA Next Gen by PCR, Nasal     Status: None   Collection Time: 12/17/22 11:09 AM   Specimen: Nasal Mucosa; Nasal Swab  Result Value Ref Range Status   MRSA by PCR Next Gen NOT DETECTED NOT DETECTED Final    Comment: (NOTE) The GeneXpert MRSA Assay (FDA approved for NASAL specimens only), is one component of a comprehensive MRSA colonization surveillance program. It is not intended to diagnose MRSA infection nor to guide or monitor treatment for MRSA infections. Test performance is not FDA approved in patients less than 67 years old. Performed at [facility], [address], [place], [place] [address]     Radiology Studies: No results found.  Scheduled Meds:  sodium chloride   Intravenous Once   atorvastatin  10 mg Oral QPM   calcium carbonate  1 tablet Oral BID   Chlorhexidine Gluconate Cloth  6 each Topical Daily   cyanocobalamin  1,000 mcg Oral Daily   gabapentin  400 mg Oral BID   insulin  aspart  0-6 Units Subcutaneous TID WC   midodrine  10 mg Oral TID PC   pantoprazole (PROTONIX) IV  40 mg Intravenous Q12H   polyethylene glycol  17 g Oral BID   sertraline  50 mg Oral Daily   spironolactone  25 mg Oral Daily   torsemide  40 mg Oral Daily   Continuous Infusions:  cefTRIAXone (ROCEPHIN)  IV 1 g (12/18/22 1951)   octreotide (  SANDOSTATIN) 500 mcg in sodium chloride 0.9 % 250 mL (2 mcg/mL) infusion 50 mcg/hr (12/19/22 1149)     LOS: 2 days    Time spent: 35 mins    Willeen Niece, MD [facility]   If 7PM-7AM, please contact night-coverage

## 2022-12-19 NOTE — Transfer of Care (Signed)
Immediate Anesthesia Transfer of Care Note  Patient: Rachel Huber  Procedure(s) Performed: FLEXIBLE SIGMOIDOSCOPY  Patient Location: PACU  Anesthesia Type:General  Level of Consciousness: drowsy  Airway & Oxygen Therapy: Patient Spontanous Breathing  Post-op Assessment: Report given to RN and Post -op Vital signs reviewed and stable  Post vital signs: Reviewed and stable  Last Vitals:  Vitals Value Taken Time  BP 90/59 12/19/22 1210  Temp    Pulse 73 12/19/22 1211  Resp 17 12/19/22 1211  SpO2 100 % 12/19/22 1211  Vitals shown include unvalidated device data.  Last Pain:  Vitals:   12/19/22 1136  TempSrc: Oral  PainSc: 0-No pain      Patients Stated Pain Goal: 0 (12/18/22 0328)  Complications: No notable events documented.

## 2022-12-19 NOTE — Brief Op Note (Signed)
12/17/2022 - 12/19/2022  12:09 PM  PATIENT:  Rachel Huber  67 y.o. female  PRE-OPERATIVE DIAGNOSIS:  rectal bleeding  POST-OPERATIVE DIAGNOSIS:  hemorrhoids;  PROCEDURE:  Procedure(s): FLEXIBLE SIGMOIDOSCOPY (N/A)  SURGEON:  Surgeon(s) and Role:    * Dolores Frame, MD - Primary  Patient underwent flex sig under propofol sedation.  Tolerated the procedure adequately.   FINDINGS: - Large external hemorrhoids were found on perianal exam.  - A large amount of formed stool without blood was found in the rectum and in the sigmoid colon, interfering with visualization.  - Internal hemorrhoids were found during retroflexion. There was some scant oozing from hemorrhoids. The hemorrhoids were medium-sized.   RECOMMENDATIONS - Return patient to hospital ward for ongoing care.  - Resume previous diet.  - Follow up as outpatient with interventional radiology regarding partial splenic embolization. - GI service will sign-off, please call us back if you have any more questions.  Katrinka Blazing, MD Gastroenterology and Hepatology [facility]

## 2022-12-19 NOTE — Op Note (Signed)
[facility] Patient Name: Rachel Huber Procedure Date: 12/19/2022 11:37 AM MRN: [id number] Date of Birth: 1956-02-25 Attending MD: Katrinka Blazing , , [id number] CSN: [id number] Age: 67 Admit Type: Inpatient Procedure:                Flexible Sigmoidoscopy Indications:              Rectal hemorrhage Providers:                Katrinka Blazing, Nena Polio, RN, Lennice Sites                            Technician, Technician Referring MD:             Rushie Goltz. [place] Medicines:                Monitored Anesthesia Care Complications:            No immediate complications. Estimated Blood Loss:     Estimated blood loss: none. Procedure:                Pre-Anesthesia Assessment:                           - Prior to the procedure, a History and Physical                            was performed, and patient medications, allergies                            and sensitivities were reviewed. The patient's                            tolerance of previous anesthesia was reviewed.                           - The risks and benefits of the procedure and the                            sedation options and risks were discussed with the                            patient. All questions were answered and informed                            consent was obtained.                           - ASA Grade Assessment: III - A patient with severe                            systemic disease.                           After obtaining informed consent, the scope was                            passed under direct vision. The PCF-HQ190L                            (  [id number]) scope was introduced through the anus and                            advanced to the the sigmoid colon. The flexible                            sigmoidoscopy was accomplished without difficulty.                            The patient tolerated the procedure well. The                            quality of the bowel preparation was poor. Scope  In: 11:59:59 AM Scope Out: 12:04:10 PM Total Procedure Duration: 0 hours 4 minutes 11 seconds  Findings:      Large external hemorrhoids were found on perianal exam.      A large amount of formed stool without blood was found in the rectum and       in the sigmoid colon, interfering with visualization.      Internal hemorrhoids were found during retroflexion. There was some       scant oozing from hemorrhoids. The hemorrhoids were medium-sized.      Note: rectal bleeding from hemorrhoids Impression:               - Preparation of the colon was poor.                           - Hemorrhoids found on perianal exam.                           - Stool in the rectum and in the sigmoid colon.                           - Internal hemorrhoids.                           - No specimens collected. Moderate Sedation:      Per Anesthesia Care Recommendation:           - Return patient to hospital ward for ongoing care.                           - Resume previous diet.                           - Follow up as outpatient with interventional                            radiology regarding partial splenic embolization. Procedure Code(s):        --- Professional ---                           [phone], Sigmoidoscopy, flexible; diagnostic,                            including collection of specimen(s) by brushing or  washing, when performed (separate procedure) Diagnosis Code(s):        --- Professional ---                           K64.8, Other hemorrhoids                           K62.5, Hemorrhage of anus and rectum CPT copyright 2022 [employer] Association. All rights reserved. The codes documented in this report are preliminary and upon coder review may  be revised to meet current compliance requirements. Katrinka Blazing, MD Katrinka Blazing,  12/19/2022 12:14:13 PM This report has been signed electronically. Number of Addenda: 0

## 2022-12-19 NOTE — Anesthesia Procedure Notes (Signed)
Date/Time: 12/19/2022 11:50 AM  Performed by: Ronny Bacon, RNPre-anesthesia Checklist: Patient identified, Emergency Drugs available, Suction available and Patient being monitored Oxygen Delivery Method: Nasal cannula Placement Confirmation: positive ETCO2

## 2022-12-19 NOTE — Anesthesia Preprocedure Evaluation (Signed)
Anesthesia Evaluation  Patient identified by MRN, date of birth, ID band Patient awake    Reviewed: Allergy & Precautions, H&P , NPO status , Patient's Chart, lab work & pertinent test results, reviewed documented beta blocker date and time   Airway Mallampati: II  TM Distance: >3 FB Neck ROM: full    Dental no notable dental hx.    Pulmonary neg pulmonary ROS, sleep apnea , pneumonia   Pulmonary exam normal breath sounds clear to auscultation       Cardiovascular Exercise Tolerance: Good hypertension, negative cardio ROS  Rhythm:regular Rate:Normal     Neuro/Psych  PSYCHIATRIC DISORDERS Anxiety Depression    negative neurological ROS  negative psych ROS   GI/Hepatic negative GI ROS,GERD  ,,(+) Cirrhosis       , Hepatitis -  Endo/Other  negative endocrine ROSdiabetes, Type 2    Renal/GU CRFRenal diseasenegative Renal ROS  negative genitourinary   Musculoskeletal   Abdominal   Peds  Hematology negative hematology ROS (+) Blood dyscrasia, anemia   Anesthesia Other Findings   Reproductive/Obstetrics negative OB ROS                             Anesthesia Physical Anesthesia Plan  ASA: 4 and emergent  Anesthesia Plan: General   Post-op Pain Management:    Induction:   PONV Risk Score and Plan: Propofol infusion  Airway Management Planned:   Additional Equipment:   Intra-op Plan:   Post-operative Plan:   Informed Consent: I have reviewed the patients History and Physical, chart, labs and discussed the procedure including the risks, benefits and alternatives for the proposed anesthesia with the patient or authorized representative who has indicated his/her understanding and acceptance.     Dental Advisory Given  Plan Discussed with: CRNA  Anesthesia Plan Comments:        Anesthesia Quick Evaluation

## 2022-12-20 DIAGNOSIS — K7581 Nonalcoholic steatohepatitis (NASH): Secondary | ICD-10-CM | POA: Diagnosis not present

## 2022-12-20 DIAGNOSIS — D649 Anemia, unspecified: Secondary | ICD-10-CM | POA: Diagnosis not present

## 2022-12-20 DIAGNOSIS — K625 Hemorrhage of anus and rectum: Secondary | ICD-10-CM | POA: Diagnosis not present

## 2022-12-20 DIAGNOSIS — K7460 Unspecified cirrhosis of liver: Secondary | ICD-10-CM | POA: Diagnosis not present

## 2022-12-20 LAB — CBC
HCT: 23.5 % — ABNORMAL LOW (ref 36.0–46.0)
Hemoglobin: 7.4 g/dL — ABNORMAL LOW (ref 12.0–15.0)
MCH: 25.7 pg — ABNORMAL LOW (ref 26.0–34.0)
MCHC: 31.5 g/dL (ref 30.0–36.0)
MCV: 81.6 fL (ref 80.0–100.0)
Platelets: 128 10*3/uL — ABNORMAL LOW (ref 150–400)
RBC: 2.88 MIL/uL — ABNORMAL LOW (ref 3.87–5.11)
RDW: 18.8 % — ABNORMAL HIGH (ref 11.5–15.5)
WBC: 3.6 10*3/uL — ABNORMAL LOW (ref 4.0–10.5)
nRBC: 0 % (ref 0.0–0.2)

## 2022-12-20 LAB — BASIC METABOLIC PANEL
Anion gap: 4 — ABNORMAL LOW (ref 5–15)
BUN: 36 mg/dL — ABNORMAL HIGH (ref 8–23)
CO2: 22 mmol/L (ref 22–32)
Calcium: 7.3 mg/dL — ABNORMAL LOW (ref 8.9–10.3)
Chloride: 112 mmol/L — ABNORMAL HIGH (ref 98–111)
Creatinine, Ser: 1.52 mg/dL — ABNORMAL HIGH (ref 0.44–1.00)
GFR, Estimated: 38 mL/min — ABNORMAL LOW (ref >60)
Glucose, Bld: 267 mg/dL — ABNORMAL HIGH (ref 70–99)
Potassium: 3.7 mmol/L (ref 3.5–5.1)
Sodium: 138 mmol/L (ref 135–145)

## 2022-12-20 LAB — MAGNESIUM: Magnesium: 1.7 mg/dL (ref 1.7–2.4)

## 2022-12-20 LAB — GLUCOSE, CAPILLARY: Glucose-Capillary: 250 mg/dL — ABNORMAL HIGH (ref 70–99)

## 2022-12-20 LAB — PHOSPHORUS: Phosphorus: 3.3 mg/dL (ref 2.5–4.6)

## 2022-12-20 MED ORDER — FLUCONAZOLE 150 MG PO TABS: 150.0000 mg | ORAL_TABLET | Freq: Every day | ORAL | 0 refills | Status: DC

## 2022-12-20 MED ORDER — SODIUM CHLORIDE 0.9 % IV BOLUS
250.0000 mL | Freq: Once | INTRAVENOUS | Status: AC
  Administered 2022-12-20: 250 mL via INTRAVENOUS

## 2022-12-20 NOTE — Plan of Care (Signed)

## 2022-12-20 NOTE — Discharge Summary (Signed)
Physician Discharge Summary   Patient: Rachel Huber MRN: [id number] DOB: 10-Jul-1956  Admit date:     12/17/2022  Discharge date: 12/20/22  Discharge Physician: Onalee Hua Ariyanna Oien   PCP: Lauro Regulus, MD   Recommendations at discharge:   Please follow up with primary care provider within 1-2 weeks  Please repeat BMP and CBC in one week      Hospital Course: 67 year old female with history of [name] liver cirrhosis with refractory ascites status post TIPS creation (s/p revision x2), thalassemia minor, significant rectal bleeding status post "emborrhoid" procedure 6/23, superior rectal artery embolization x 2, with recurrent grade 4 internal hemorrhoids associated with rectal bleeding requiring frequent transfusions.  Unfortunately despite these procedures she has continued to require near weekly blood transfusions and weekly large volume paracentesis.  Pt had paracentesis 4/30 where 8L of ascitic fluid was removed. Pt is working with [facility] to complete liver transplant workup.  She is working with radiologist Dr. Elby Showers for consideration of partial splenic embolization to help decrease portal hypertension.  Dr. Elby Showers has said that this has been approved by [facility] IR and is being tentatively scheduled.   Pt presented to [facility] for routine visit and noted to be very pale and fatigued. CBC revealed that her Hg was 4.9.  She will require blood transfusion.  Hematology recommends at least 3 units PRBCs with goal of getting hemoglobin greater than 7 and sustaining at greater than 7.  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   This was discussed with her GI (C. Locklear) and IR (D. Suttle) doctors at [facility] and they have recommended that she be transfused now at [facility].  IF she doesn't  respond to transfusion then they recommend she transfer to [facility] and Dr. Elby Showers would discuss other options with her for interventions.  They feel this is due to portal hypertension.  She has soft blood pressures but she is otherwise hemodynamically stable and mentating well.  She will be admitted to [facility] stepdown ICU and will transfuse 3 units PRBC.    Assessment and Plan:  Recurrent symptomatic anemia/Acute Blood Loss Anemia Likely sec. to recurrent rectal bleeding from grade 4 internal hemorrhoids exacerbated by portal hypertension. Hb down to 4.9 on admission Hematologist recommends keeping Hb >7 and holding at >7 S/p 4 units PRBC.  Hemoglobin improved to 7.7 Continue lasix IV in between PRBC Discussed with her GI and IR at [facility], No need to transfer to [facility] now Transfuse blood at [facility], if that is not effective and Hb still trending down rapidly then transfer to [facility]. Dr. Elby Showers reported that [facility] IR approved for her to have partial splenic embolization to reduce portal hypertension, this is being scheduled for around 5/13 at [facility] Continue to monitor H&H. GI consulted @ [facility],  She underwent flexible sigmoidoscopy 5/3, she is found to have large external hemorrhoids, internal hemorrhoids.  GI recommended to follow-up IR for partial splenic embolization is a scheduled. -Hgb remains largely stable--7.4 on day of d/c -pt has appointment for CBC at [facility] 5/8   Decompensated NASH liver cirrhosis  Patient receives weekly large volume paracentesis. She had paracentesis on 4/30 at [facility] where 8L ascitic fluid was removed Resume home diuretics. Patient working closely with [facility] for liver transplant, has almost  completed all of the requirements per pt.   CKD Stage 3b  Renal function holding stable from recent tests. Continue to renally dose medications  Serum creatinine 1.52 on day of dc -baseline creatinine 1.5-1.7    Thrombocytopenia  Secondary to chronic liver disease. Hold all  heparin products Platelets 128 on day d/c   Hypotension  multifactorial , exacerbated due to severe anemia Post 4 units PRBC.  Follow-up posttransfusion hemoglobin. Hb 7.9 Continue midodrine 10 mg 3 times daily -BP remains stable on midodrine   Type 2 DM with renal complications  Diet Controlled. Most recent A1c was 5.5%  Continue  SSI coverage as needed   OSA - nightly CPAP ordered   Grade IV internal hemorrhoids and rectal bleeding Discussed with GI and interventional radiologist. Transfuse as needed. If no improvement, transfer to [facility]. Outpatient partial splenic embolization being arranged by Dr. Elby Showers to reduce portal hypertension       Consultants: GI Procedures performed: flex sig  Disposition: Home Diet recommendation:  Cardiac and Carb modified diet DISCHARGE MEDICATION: Allergies as of 12/20/2022       Reactions   Gramineae Pollens Other (See Comments)   Sneezing, Running nose   Latex Rash   Contact rash        Medication List     STOP taking these medications    ciprofloxacin 500 MG tablet Commonly known as: CIPRO       TAKE these medications    acetaminophen 650 MG CR tablet Commonly known as: TYLENOL Take 1,300 mg by mouth daily as needed for pain.   albuterol 108 (90 Base) MCG/ACT inhaler Commonly known as: VENTOLIN HFA Inhale 2 puffs into the lungs every 6 (six) hours as needed for wheezing or shortness of breath.   atorvastatin 10 MG tablet Commonly known as: LIPITOR Take 1 tablet (10 mg total) by mouth daily.   cyanocobalamin 1000 MCG tablet Commonly known as: VITAMIN B12 Take 1 tablet (1,000 mcg total) by mouth daily.   diphenhydrAMINE 25 MG tablet Commonly known as: BENADRYL Take 25 mg by mouth 2 (two) times daily as needed for itching.   ergocalciferol 1.25 MG (50000 UT) capsule Commonly known as: VITAMIN D2 Take 1 capsule (50,000 Units total) by mouth every 14 (fourteen) days.   gabapentin 400 MG capsule Commonly  known as: NEURONTIN Take 400 mg by mouth 2 (two) times daily.   melatonin 5 MG Tabs Take 10 mg by mouth at bedtime as needed (sleep).   midodrine 10 MG tablet Commonly known as: PROAMATINE Take 10 mg by mouth 3 (three) times daily.   multivitamin with minerals tablet Take 1 tablet by mouth daily.   oxymetazoline 0.05 % nasal spray Commonly known as: AFRIN Place 1 spray into both nostrils daily as needed for congestion.   polyethylene glycol 17 g packet Commonly known as: MIRALAX / GLYCOLAX Take 17 g by mouth 2 (two) times daily.   sertraline 50 MG tablet Commonly known as: ZOLOFT Take 50 mg by mouth daily.   spironolactone 25 MG tablet Commonly known as: ALDACTONE Take 1 tablet (25 mg total) by mouth daily.   Torsemide 40 MG Tabs Take 40 mg by mouth daily.        Discharge Exam: Filed Weights   12/19/22 0456 12/19/22 1136 12/20/22 0648  Weight: 119.8 kg 120.2 kg 116.8 kg   HEENT:  [place]/AT, No thrush, no icterus CV:  RRR, no rub, no S3, no S4 Lung:  CTA, no wheeze, no rhonchi Abd:  soft/+BS, NT Ext:  No edema, no lymphangitis, no synovitis, no rash   Condition at discharge: stable  The results of significant diagnostics from this hospitalization (including imaging, microbiology, ancillary and laboratory) are listed below for reference.   Imaging Studies: US Paracentesis  Result Date: 12/16/2022 INDICATION: History of NASH cirrhosis with recurrent symptomatic ascites despite TIPS creation and revision (tips creation and revision performed at [facility]. Patient currently being evaluated by Dr. Elby Showers for consideration splenic embolization Patient presents today for routine ultrasound-guided paracentesis with max volume of 8 L. EXAM: ULTRASOUND-GUIDED PARACENTESIS COMPARISON:  Multiple previous ultrasound-guided paracenteses, most recently on 12/09/2022 yielding 5.8 L of peritoneal fluid. MEDICATIONS: None. COMPLICATIONS: None immediate.  TECHNIQUE: Informed written consent was obtained from the patient after a discussion of the risks, benefits and alternatives to treatment. A timeout was performed prior to the initiation of the procedure. Initial ultrasound scanning demonstrates a moderate amount of ascites within the abdomen with dominant pocket located within the right mid abdomen. Right mid abdomen was subsequently prepped and draped in usual sterile fashion. 1% lidocaine with epinephrine was used for local anesthesia. Under direct ultrasound guidance, an 8 Fr Safe-T-Centesis catheter was introduced. Multiple ultrasound images were saved procedural documentation purposes. The paracentesis was performed. The catheter was removed and a dressing was applied. The patient tolerated the procedure well without immediate post procedural complication. FINDINGS: A total of approximately 8 liters of serous fluid was removed. IMPRESSION: Successful ultrasound-guided paracentesis yielding 8 liters of peritoneal fluid. Electronically Signed   By: Simonne Come M.D.   On: 12/16/2022 12:21   IR Radiologist Eval & Mgmt  Result Date: 12/12/2022 EXAM: ESTABLISHED PATIENT OFFICE VISIT CHIEF COMPLAINT: See Epic note. HISTORY OF PRESENT ILLNESS: See Epic note. REVIEW OF SYSTEMS: See Epic note. PHYSICAL EXAMINATION: See Epic note. ASSESSMENT AND PLAN: See Epic note. Marliss Coots, MD [facility] Electronically Signed   By: Marliss Coots M.D.   On: 12/12/2022 12:42   US Paracentesis  Result Date: 12/09/2022 INDICATION: Patient with a history of cirrhosis with recurrent ascites. Patient is status post TIPS and TIPS revision at [facility]. Interventional radiology asked to perform a therapeutic paracentesis. EXAM: ULTRASOUND GUIDED PARACENTESIS MEDICATIONS: 1% lidocaine 20 mL COMPLICATIONS: None immediate. PROCEDURE: Informed written consent was obtained from the patient after a discussion of the risks, benefits  and alternatives to treatment. A timeout was performed prior to the initiation of the procedure. Initial ultrasound scanning demonstrates a large amount of ascites within the right lower abdominal quadrant. The right lower abdomen was prepped and draped in the usual sterile fashion. 1% lidocaine was used for local anesthesia. Following this, a 19 gauge, 7-cm, Yueh catheter was introduced. An ultrasound image was saved for documentation purposes. The paracentesis was performed. The catheter was removed and a dressing was applied. The patient tolerated the procedure well without immediate post procedural complication. Patient received post-procedure intravenous albumin; see nursing notes for details. FINDINGS: A total of approximately 5.8 L of clear yellow fluid was removed. IMPRESSION: Successful ultrasound-guided paracentesis yielding 5.8 liters of peritoneal fluid. Read by: Alwyn Ren, NP PLAN: Patient is status post TIPS and TIPS revision at [facility]. Patient is still followed by [facility]. Electronically Signed   By: Olive Bass M.D.   On: 12/09/2022 12:53   IR IMAGING GUIDED PORT INSERTION  Result Date: 11/25/2022 CLINICAL DATA:  Chronic anemia, transfusion dependent, poor peripheral IV access EXAM: RIGHT INTERNAL JUGULAR SINGLE LUMEN POWER PORT CATHETER INSERTION  Date:  11/25/2022 11/25/2022 12:58 pm Radiologist:  Judie Petit. Ruel Favors, MD Guidance:  Ultrasound and fluoroscopic MEDICATIONS: 1% lidocaine local with epinephrine ANESTHESIA/SEDATION: Versed 2.0 mg IV; Fentanyl 100 mcg IV; Moderate Sedation Time:  30 minute The patient was continuously monitored during the procedure by the interventional radiology nurse under my direct supervision. FLUOROSCOPY: 0 minutes, 36 seconds (4 mGy) COMPLICATIONS: None immediate. CONTRAST:  None. PROCEDURE: Informed consent was obtained from the patient following explanation of the procedure, risks, benefits and alternatives. The patient understands, agrees and consents for the  procedure. All questions were addressed. A time out was performed. Maximal barrier sterile technique utilized including caps, mask, sterile gowns, sterile gloves, large sterile drape, hand hygiene, and 2% chlorhexidine scrub. Under sterile conditions and local anesthesia, right internal jugular micropuncture venous access was performed. Access was performed with ultrasound. Images were obtained for documentation of the patent right internal jugular vein. A guide wire was inserted followed by a transitional dilator. This allowed insertion of a guide wire and catheter into the IVC. Measurements were obtained from the SVC / RA junction back to the right IJ venotomy site. In the right infraclavicular chest, a subcutaneous pocket was created over the second anterior rib. This was done under sterile conditions and local anesthesia. 1% lidocaine with epinephrine was utilized for this. A 2.5 cm incision was made in the skin. Blunt dissection was performed to create a subcutaneous pocket over the right pectoralis major muscle. The pocket was flushed with saline vigorously. There was adequate hemostasis. The port catheter was assembled and checked for leakage. The port catheter was secured in the pocket with two retention sutures. The tubing was tunneled subcutaneously to the right venotomy site and inserted into the SVC/RA junction through a valved peel-away sheath. Position was confirmed with fluoroscopy. Images were obtained for documentation. The patient tolerated the procedure well. No immediate complications. Incisions were closed in a two layer fashion with 4 - 0 Vicryl suture. Dermabond was applied to the skin. The port catheter was accessed, blood was aspirated followed by saline and heparin flushes. Needle was removed. A dry sterile dressing was applied. IMPRESSION: Ultrasound and fluoroscopically guided right internal jugular single lumen power port catheter insertion. Tip in the SVC/RA junction. Catheter ready for  use. Electronically Signed   By: Judie Petit.  Shick M.D.   On: 11/25/2022 13:08   IR Paracentesis  Result Date: 11/25/2022 INDICATION: Cirrhosis, recurrent ascites, abdominal distension EXAM: ULTRASOUND GUIDED PARACENTESIS MEDICATIONS: 1% lidocaine local COMPLICATIONS: None immediate. PROCEDURE: An ultrasound guided paracentesis was thoroughly discussed with the patient and questions answered. The benefits, risks, alternatives and complications were also discussed. The patient understands and wishes to proceed with the procedure. Written consent was obtained. Ultrasound was performed to localize and mark an adequate pocket of fluid in the right lower quadrant of the abdomen. The area was then prepped and draped in the normal sterile fashion. 1% Lidocaine was used for local anesthesia. Under ultrasound guidance a 6 French Safe-T-Centesis needle catheter was introduced. Paracentesis was performed. The catheter was removed and a dressing applied. FINDINGS: A total of approximately 4.4 L of clear peritoneal fluid was removed. A fluid sample was not sent for laboratory analysis. IMPRESSION: Successful ultrasound guided paracentesis yielding 4.4 L of ascites. Electronically Signed   By: Judie Petit.  Shick M.D.   On: 11/25/2022 13:06    Microbiology: Results for orders placed or performed during the hospital encounter of 12/17/22  MRSA Next Gen by PCR, Nasal     Status:  None   Collection Time: 12/17/22 11:09 AM   Specimen: Nasal Mucosa; Nasal Swab  Result Value Ref Range Status   MRSA by PCR Next Gen NOT DETECTED NOT DETECTED Final    Comment: (NOTE) The GeneXpert MRSA Assay (FDA approved for NASAL specimens only), is one component of a comprehensive MRSA colonization surveillance program. It is not intended to diagnose MRSA infection nor to guide or monitor treatment for MRSA infections. Test performance is not FDA approved in patients less than 25 years old. Performed at [facility], [address] [place], [place]  [address]     Labs: CBC: Recent Labs  Lab 12/17/22 [phone] 12/18/22 0523 12/18/22 0725 12/18/22 1354 12/18/22 1834 12/19/22 0413 12/20/22 0423  WBC 2.4* 3.6*  --   --   --  3.9* 3.6*  HGB 4.9* 6.8* 6.7* 7.7* 7.7* 7.9* 7.4*  HCT 15.6* 20.6* 20.7* 23.9* 23.4* 24.3* 23.5*  MCV 80.0 78.3*  --   --   --  79.7* 81.6  PLT 119* 125*  --   --   --  132* 128*   Basic Metabolic Panel: Recent Labs  Lab 12/17/22 1132 12/18/22 0523 12/19/22 0413 12/20/22 0423  NA 134* 136 138 138  K 4.1 4.2 4.3 3.7  CL 109 110 110 112*  CO2 20* 22 22 22   GLUCOSE 182* 175* 135* 267*  BUN 38* 34* 35* 36*  CREATININE 1.70* 1.49* 1.60* 1.52*  CALCIUM 7.9* 7.9* 7.9* 7.3*  MG  --  1.9 1.6* 1.7  PHOS  --   --  3.0 3.3   Liver Function Tests: Recent Labs  Lab 12/18/22 0523  AST 33  ALT 18  ALKPHOS 106  BILITOT 1.3*  PROT 3.9*  ALBUMIN 2.1*   CBG: Recent Labs  Lab 12/18/22 2047 12/19/22 0801 12/19/22 1610 12/19/22 2005 12/20/22 0739  GLUCAP 137* 127* 194* 283* 250*    Discharge time spent: greater than 30 minutes.  Signed: Catarina Hartshorn, MD [facility] 12/20/2022

## 2022-12-20 NOTE — Progress Notes (Signed)
Patient discharged at this time.  No s/s of distress noted denies any pain.  Educated on AVS and where to pick up new RX.  All belongings packed per patient and taken down with her to personal vehicle where husband was waiting.

## 2022-12-21 NOTE — Anesthesia Postprocedure Evaluation (Signed)
Anesthesia Post Note  Patient: Rachel Huber  Procedure(s) Performed: FLEXIBLE SIGMOIDOSCOPY  Patient location during evaluation: Phase II Anesthesia Type: General Level of consciousness: awake Pain management: pain level controlled Vital Signs Assessment: post-procedure vital signs reviewed and stable Respiratory status: spontaneous breathing and respiratory function stable Cardiovascular status: blood pressure returned to baseline and stable Postop Assessment: no headache and no apparent nausea or vomiting Anesthetic complications: no Comments: Late entry   No notable events documented.   Last Vitals:  Vitals:   12/20/22 0900 12/20/22 1000  BP: (!) 109/36 (!) 116/40  Pulse: 69 67  Resp: 17 13  Temp:    SpO2: 97% 96%    Last Pain:  Vitals:   12/20/22 0800  TempSrc:   PainSc: 0-No pain                 Windell Norfolk

## 2022-12-22 ENCOUNTER — Other Ambulatory Visit: Payer: Self-pay | Admitting: Gastroenterology

## 2022-12-22 DIAGNOSIS — K7581 Nonalcoholic steatohepatitis (NASH): Secondary | ICD-10-CM

## 2022-12-22 DIAGNOSIS — E1142 Type 2 diabetes mellitus with diabetic polyneuropathy: Secondary | ICD-10-CM | POA: Diagnosis not present

## 2022-12-22 DIAGNOSIS — L97522 Non-pressure chronic ulcer of other part of left foot with fat layer exposed: Secondary | ICD-10-CM | POA: Diagnosis not present

## 2022-12-23 ENCOUNTER — Inpatient Hospital Stay: Payer: Medicare Other

## 2022-12-23 ENCOUNTER — Ambulatory Visit
Admission: RE | Admit: 2022-12-23 | Discharge: 2022-12-23 | Disposition: A | Discharge status: Home / Self Care | Payer: Medicare Other | Source: Ambulatory Visit | Attending: Gastroenterology | Admitting: Gastroenterology

## 2022-12-23 ENCOUNTER — Telehealth: Payer: Self-pay

## 2022-12-23 VITALS — BP 115/53 | HR 64 | Temp 97.2°F | Resp 16

## 2022-12-23 DIAGNOSIS — E559 Vitamin D deficiency, unspecified: Secondary | ICD-10-CM | POA: Diagnosis not present

## 2022-12-23 DIAGNOSIS — Z90722 Acquired absence of ovaries, bilateral: Secondary | ICD-10-CM | POA: Diagnosis not present

## 2022-12-23 DIAGNOSIS — K7581 Nonalcoholic steatohepatitis (NASH): Secondary | ICD-10-CM | POA: Insufficient documentation

## 2022-12-23 DIAGNOSIS — N1832 Chronic kidney disease, stage 3b: Secondary | ICD-10-CM | POA: Diagnosis not present

## 2022-12-23 DIAGNOSIS — Z95828 Presence of other vascular implants and grafts: Secondary | ICD-10-CM

## 2022-12-23 DIAGNOSIS — D500 Iron deficiency anemia secondary to blood loss (chronic): Secondary | ICD-10-CM

## 2022-12-23 DIAGNOSIS — Z807 Family history of other malignant neoplasms of lymphoid, hematopoietic and related tissues: Secondary | ICD-10-CM | POA: Diagnosis not present

## 2022-12-23 DIAGNOSIS — D631 Anemia in chronic kidney disease: Secondary | ICD-10-CM | POA: Diagnosis not present

## 2022-12-23 DIAGNOSIS — E538 Deficiency of other specified B group vitamins: Secondary | ICD-10-CM | POA: Diagnosis not present

## 2022-12-23 DIAGNOSIS — K7460 Unspecified cirrhosis of liver: Secondary | ICD-10-CM | POA: Diagnosis not present

## 2022-12-23 DIAGNOSIS — D561 Beta thalassemia: Secondary | ICD-10-CM | POA: Diagnosis not present

## 2022-12-23 DIAGNOSIS — Z803 Family history of malignant neoplasm of breast: Secondary | ICD-10-CM | POA: Diagnosis not present

## 2022-12-23 DIAGNOSIS — D696 Thrombocytopenia, unspecified: Secondary | ICD-10-CM | POA: Diagnosis not present

## 2022-12-23 DIAGNOSIS — R188 Other ascites: Secondary | ICD-10-CM | POA: Diagnosis not present

## 2022-12-23 DIAGNOSIS — I129 Hypertensive chronic kidney disease with stage 1 through stage 4 chronic kidney disease, or unspecified chronic kidney disease: Secondary | ICD-10-CM | POA: Diagnosis not present

## 2022-12-23 DIAGNOSIS — Z90710 Acquired absence of both cervix and uterus: Secondary | ICD-10-CM | POA: Diagnosis not present

## 2022-12-23 DIAGNOSIS — Z8542 Personal history of malignant neoplasm of other parts of uterus: Secondary | ICD-10-CM | POA: Diagnosis not present

## 2022-12-23 DIAGNOSIS — Z8051 Family history of malignant neoplasm of kidney: Secondary | ICD-10-CM | POA: Diagnosis not present

## 2022-12-23 LAB — CBC
HCT: 24.9 % — ABNORMAL LOW (ref 36.0–46.0)
Hemoglobin: 7.8 g/dL — ABNORMAL LOW (ref 12.0–15.0)
MCH: 25.3 pg — ABNORMAL LOW (ref 26.0–34.0)
MCHC: 31.3 g/dL (ref 30.0–36.0)
MCV: 80.8 fL (ref 80.0–100.0)
Platelets: 105 10*3/uL — ABNORMAL LOW (ref 150–400)
RBC: 3.08 MIL/uL — ABNORMAL LOW (ref 3.87–5.11)
RDW: 19 % — ABNORMAL HIGH (ref 11.5–15.5)
WBC: 4.1 10*3/uL (ref 4.0–10.5)
nRBC: 0 % (ref 0.0–0.2)

## 2022-12-23 LAB — SAMPLE TO BLOOD BANK

## 2022-12-23 MED ORDER — ALBUMIN HUMAN 25 % IV SOLN: 25.0000 g | Freq: Once | INTRAVENOUS | Status: AC

## 2022-12-23 MED ORDER — LIDOCAINE HCL (PF) 1 % IJ SOLN
10.0000 mL | Freq: Once | INTRAMUSCULAR | Status: AC
  Administered 2022-12-23: 10 mL via INTRADERMAL
  Filled 2022-12-23: qty 10

## 2022-12-23 MED ORDER — HEPARIN SOD (PORK) LOCK FLUSH 100 UNIT/ML IV SOLN
INTRAVENOUS | Status: AC
  Filled 2022-12-23: qty 5

## 2022-12-23 MED ORDER — ALBUMIN HUMAN 25 % IV SOLN
12.5000 g | Freq: Once | INTRAVENOUS | Status: AC
  Administered 2022-12-23: 12.5 g via INTRAVENOUS

## 2022-12-23 MED ORDER — HEPARIN SOD (PORK) LOCK FLUSH 100 UNIT/ML IV SOLN
500.0000 [IU] | Freq: Once | INTRAVENOUS | Status: AC
  Administered 2022-12-23: 500 [IU] via INTRAVENOUS

## 2022-12-23 MED ORDER — ALBUMIN HUMAN 25 % IV SOLN
INTRAVENOUS | Status: AC
  Administered 2022-12-23: 25 g via INTRAVENOUS
  Filled 2022-12-23: qty 100

## 2022-12-23 MED ORDER — ALBUMIN HUMAN 25 % IV SOLN
INTRAVENOUS | Status: AC
  Filled 2022-12-23: qty 100

## 2022-12-23 MED ORDER — SODIUM CHLORIDE 0.9% FLUSH
10.0000 mL | Freq: Once | INTRAVENOUS | Status: AC
  Administered 2022-12-23: 10 mL via INTRAVENOUS

## 2022-12-23 NOTE — Discharge Instructions (Signed)
Discharge instructions reviewed with patient [id number]

## 2022-12-23 NOTE — Patient Instructions (Signed)
[facility]  Discharge Instructions: Thank you for choosing [facility] to provide your oncology and hematology care.  If you have a lab appointment with the Cancer Center - please note that after April 8th, 2024, all labs will be drawn in the cancer center.  You do not have to check in or register with the main entrance as you have in the past but will complete your check-in in the cancer center.  Wear comfortable clothing and clothing appropriate for easy access to any Portacath or PICC line.   We strive to give you quality time with your provider. You may need to reschedule your appointment if you arrive late (15 or more minutes).  Arriving late affects you and other patients whose appointments are after yours.  Also, if you miss three or more appointments without notifying the office, you may be dismissed from the clinic at the provider's discretion.      For prescription refill requests, have your pharmacy contact our office and allow 72 hours for refills to be completed.    Today you received the following Port flush with labs, return as scheduled.   To help prevent nausea and vomiting after your treatment, we encourage you to take your nausea medication as directed.  BELOW ARE SYMPTOMS THAT SHOULD BE REPORTED IMMEDIATELY: *FEVER GREATER THAN 100.4 F (38 C) OR HIGHER *CHILLS OR SWEATING *NAUSEA AND VOMITING THAT IS NOT CONTROLLED WITH YOUR NAUSEA MEDICATION *UNUSUAL SHORTNESS OF BREATH *UNUSUAL BRUISING OR BLEEDING *URINARY PROBLEMS (pain or burning when urinating, or frequent urination) *BOWEL PROBLEMS (unusual diarrhea, constipation, pain near the anus) TENDERNESS IN MOUTH AND THROAT WITH OR WITHOUT PRESENCE OF ULCERS (sore throat, sores in mouth, or a toothache) UNUSUAL RASH, SWELLING OR PAIN  UNUSUAL VAGINAL DISCHARGE OR ITCHING   Items with * indicate a potential emergency and should be followed up as soon as possible or go to the Emergency  Department if any problems should occur.  Please show the CHEMOTHERAPY ALERT CARD or IMMUNOTHERAPY ALERT CARD at check-in to the Emergency Department and triage nurse.  Should you have questions after your visit or need to cancel or reschedule your appointment, please contact [facility] [phone]  and follow the prompts.  Office hours are 8:00 a.m. to 4:30 p.m. Monday - Friday. Please note that voicemails left after 4:00 p.m. may not be returned until the following business day.  We are closed weekends and major holidays. You have access to a nurse at all times for urgent questions. Please call the main number to the clinic [phone] and follow the prompts.  For any non-urgent questions, you may also contact your provider using MyChart. We now offer e-Visits for anyone 18 and older to request care online for non-urgent symptoms. For details visit [email or web address].   Also download the MyChart app! Go to the app store, search "MyChart", open the app, select [facility], and log in with your MyChart username and password.   

## 2022-12-23 NOTE — Telephone Encounter (Signed)
Called patient to let her know she does not need blood tomorrow. Both mailboxes are full. Hemoglobin is 7.8

## 2022-12-23 NOTE — Progress Notes (Signed)
Port flushed with good blood return noted. No bruising or swelling at site. Bandaid applied and patient discharged in satisfactory condition. VVS stable with no signs or symptoms of distressed noted. 

## 2022-12-24 ENCOUNTER — Inpatient Hospital Stay: Payer: Medicare Other

## 2022-12-24 ENCOUNTER — Encounter (HOSPITAL_COMMUNITY): Payer: Self-pay | Admitting: Gastroenterology

## 2022-12-29 ENCOUNTER — Ambulatory Visit
Admission: RE | Admit: 2022-12-29 | Discharge: 2022-12-29 | Disposition: A | Discharge status: Home / Self Care | Payer: Medicare Other | Source: Ambulatory Visit | Attending: Interventional Radiology | Admitting: Interventional Radiology

## 2022-12-30 ENCOUNTER — Ambulatory Visit
Admission: RE | Admit: 2022-12-30 | Discharge: 2022-12-30 | Disposition: A | Discharge status: Home / Self Care | Payer: Medicare Other | Source: Ambulatory Visit | Attending: Gastroenterology | Admitting: Gastroenterology

## 2022-12-30 ENCOUNTER — Telehealth: Payer: Self-pay

## 2022-12-30 ENCOUNTER — Inpatient Hospital Stay: Payer: Medicare Other

## 2022-12-30 DIAGNOSIS — K7460 Unspecified cirrhosis of liver: Secondary | ICD-10-CM | POA: Insufficient documentation

## 2022-12-30 DIAGNOSIS — R188 Other ascites: Secondary | ICD-10-CM | POA: Diagnosis not present

## 2022-12-30 DIAGNOSIS — K7581 Nonalcoholic steatohepatitis (NASH): Secondary | ICD-10-CM | POA: Diagnosis not present

## 2022-12-30 MED ORDER — ALBUMIN HUMAN 25 % IV SOLN
INTRAVENOUS | Status: AC
  Filled 2022-12-30: qty 100

## 2022-12-30 MED ORDER — ALBUMIN HUMAN 25 % IV SOLN
25.0000 g | Freq: Once | INTRAVENOUS | Status: AC
  Administered 2022-12-30: 25 g via INTRAVENOUS

## 2022-12-30 MED ORDER — HEPARIN SOD (PORK) LOCK FLUSH 100 UNIT/ML IV SOLN
INTRAVENOUS | Status: AC
  Filled 2022-12-30: qty 5

## 2022-12-30 MED ORDER — LIDOCAINE HCL (PF) 1 % IJ SOLN
10.0000 mL | Freq: Once | INTRAMUSCULAR | Status: AC
  Administered 2022-12-30: 10 mL via SUBCUTANEOUS
  Filled 2022-12-30: qty 10

## 2022-12-30 NOTE — Telephone Encounter (Signed)
Called patient's cell pertaining to missed port flush/lab appointment today. Unable to leave message due to mailbox is full  on her cell phone.

## 2022-12-30 NOTE — Procedures (Signed)
Ultrasound-guided diagnostic and therapeutic paracentesis performed yielding 5.7 liters of straw  colored fluid. No immediate complications. EBL is < 2 ml.

## 2022-12-30 NOTE — Progress Notes (Deleted)
Called patient pertaining to her port flush lab appointment she missed today to check her HGB. Called cell phone and mailbox was full and unable to leave message. Patient called by scheduling several attempts. Unsuccessful.

## 2023-01-05 ENCOUNTER — Other Ambulatory Visit: Payer: Self-pay | Admitting: Gastroenterology

## 2023-01-05 ENCOUNTER — Other Ambulatory Visit: Payer: Self-pay | Admitting: Internal Medicine

## 2023-01-05 ENCOUNTER — Other Ambulatory Visit: Payer: Self-pay | Admitting: Interventional Radiology

## 2023-01-05 ENCOUNTER — Ambulatory Visit: Payer: Medicare Other

## 2023-01-05 DIAGNOSIS — K7460 Unspecified cirrhosis of liver: Secondary | ICD-10-CM

## 2023-01-05 DIAGNOSIS — E1142 Type 2 diabetes mellitus with diabetic polyneuropathy: Secondary | ICD-10-CM | POA: Diagnosis not present

## 2023-01-05 DIAGNOSIS — L97522 Non-pressure chronic ulcer of other part of left foot with fat layer exposed: Secondary | ICD-10-CM | POA: Diagnosis not present

## 2023-01-05 NOTE — Progress Notes (Signed)
Patient for IR Partial splenic embolization with overnight observation admission and IR Paracentesis on Tues 01/06/23, I called and spoke with the patient on the phone and gave pre-procedure instructions. Pt was made aware to be here at 9a, NPO after MN prior to procedure as well as driver post procedure/recovery/discharge. Pt stated understanding.  Called   01/05/2023

## 2023-01-06 ENCOUNTER — Telehealth: Payer: Self-pay

## 2023-01-06 ENCOUNTER — Inpatient Hospital Stay: Payer: Medicare Other

## 2023-01-06 ENCOUNTER — Ambulatory Visit
Admit: 2023-01-06 | Discharge: 2023-01-06 | Disposition: A | Discharge status: Home / Self Care | Payer: Medicare Other | Attending: Interventional Radiology | Admitting: Interventional Radiology

## 2023-01-06 ENCOUNTER — Other Ambulatory Visit: Payer: Self-pay

## 2023-01-06 ENCOUNTER — Encounter: Payer: Self-pay | Admitting: Radiology

## 2023-01-06 ENCOUNTER — Inpatient Hospital Stay
Admission: RE | Admit: 2023-01-06 | Discharge: 2023-01-19 | DRG: 982 | Disposition: A | Discharge status: Rehabilitation Facility | Payer: Medicare Other | Source: Ambulatory Visit | Attending: Internal Medicine | Admitting: Internal Medicine

## 2023-01-06 VITALS — BP 111/48 | HR 75 | Temp 98.9°F | Resp 18 | Ht 69.0 in | Wt 259.0 lb

## 2023-01-06 VITALS — BP 135/53 | HR 68 | Temp 97.9°F | Resp 15 | Ht 69.0 in | Wt 260.0 lb

## 2023-01-06 DIAGNOSIS — Z79899 Other long term (current) drug therapy: Secondary | ICD-10-CM

## 2023-01-06 DIAGNOSIS — K766 Portal hypertension: Secondary | ICD-10-CM | POA: Diagnosis present

## 2023-01-06 DIAGNOSIS — E876 Hypokalemia: Secondary | ICD-10-CM | POA: Diagnosis present

## 2023-01-06 DIAGNOSIS — K219 Gastro-esophageal reflux disease without esophagitis: Secondary | ICD-10-CM | POA: Diagnosis present

## 2023-01-06 DIAGNOSIS — K644 Residual hemorrhoidal skin tags: Secondary | ICD-10-CM | POA: Diagnosis present

## 2023-01-06 DIAGNOSIS — F419 Anxiety disorder, unspecified: Secondary | ICD-10-CM | POA: Diagnosis present

## 2023-01-06 DIAGNOSIS — D735 Infarction of spleen: Secondary | ICD-10-CM | POA: Diagnosis not present

## 2023-01-06 DIAGNOSIS — N184 Chronic kidney disease, stage 4 (severe): Secondary | ICD-10-CM | POA: Diagnosis present

## 2023-01-06 DIAGNOSIS — Z8616 Personal history of COVID-19: Secondary | ICD-10-CM

## 2023-01-06 DIAGNOSIS — F418 Other specified anxiety disorders: Secondary | ICD-10-CM | POA: Diagnosis present

## 2023-01-06 DIAGNOSIS — N1832 Chronic kidney disease, stage 3b: Secondary | ICD-10-CM | POA: Diagnosis present

## 2023-01-06 DIAGNOSIS — D7389 Other diseases of spleen: Principal | ICD-10-CM

## 2023-01-06 DIAGNOSIS — K7460 Unspecified cirrhosis of liver: Secondary | ICD-10-CM | POA: Diagnosis present

## 2023-01-06 DIAGNOSIS — Z96611 Presence of right artificial shoulder joint: Secondary | ICD-10-CM | POA: Diagnosis present

## 2023-01-06 DIAGNOSIS — E1140 Type 2 diabetes mellitus with diabetic neuropathy, unspecified: Secondary | ICD-10-CM | POA: Diagnosis present

## 2023-01-06 DIAGNOSIS — N179 Acute kidney failure, unspecified: Secondary | ICD-10-CM | POA: Diagnosis not present

## 2023-01-06 DIAGNOSIS — K7682 Hepatic encephalopathy: Secondary | ICD-10-CM | POA: Diagnosis not present

## 2023-01-06 DIAGNOSIS — Z9841 Cataract extraction status, right eye: Secondary | ICD-10-CM

## 2023-01-06 DIAGNOSIS — Z862 Personal history of diseases of the blood and blood-forming organs and certain disorders involving the immune mechanism: Secondary | ICD-10-CM

## 2023-01-06 DIAGNOSIS — Z8249 Family history of ischemic heart disease and other diseases of the circulatory system: Secondary | ICD-10-CM

## 2023-01-06 DIAGNOSIS — I129 Hypertensive chronic kidney disease with stage 1 through stage 4 chronic kidney disease, or unspecified chronic kidney disease: Secondary | ICD-10-CM | POA: Diagnosis present

## 2023-01-06 DIAGNOSIS — G4733 Obstructive sleep apnea (adult) (pediatric): Secondary | ICD-10-CM | POA: Diagnosis present

## 2023-01-06 DIAGNOSIS — D649 Anemia, unspecified: Secondary | ICD-10-CM | POA: Diagnosis present

## 2023-01-06 DIAGNOSIS — R52 Pain, unspecified: Secondary | ICD-10-CM

## 2023-01-06 DIAGNOSIS — Z9842 Cataract extraction status, left eye: Secondary | ICD-10-CM

## 2023-01-06 DIAGNOSIS — E785 Hyperlipidemia, unspecified: Secondary | ICD-10-CM | POA: Diagnosis present

## 2023-01-06 DIAGNOSIS — F32A Depression, unspecified: Secondary | ICD-10-CM | POA: Diagnosis present

## 2023-01-06 DIAGNOSIS — D563 Thalassemia minor: Principal | ICD-10-CM | POA: Diagnosis present

## 2023-01-06 DIAGNOSIS — E1122 Type 2 diabetes mellitus with diabetic chronic kidney disease: Secondary | ICD-10-CM | POA: Diagnosis present

## 2023-01-06 DIAGNOSIS — D61818 Other pancytopenia: Secondary | ICD-10-CM | POA: Insufficient documentation

## 2023-01-06 DIAGNOSIS — R188 Other ascites: Secondary | ICD-10-CM | POA: Diagnosis present

## 2023-01-06 DIAGNOSIS — K7581 Nonalcoholic steatohepatitis (NASH): Secondary | ICD-10-CM | POA: Diagnosis present

## 2023-01-06 DIAGNOSIS — R509 Fever, unspecified: Secondary | ICD-10-CM | POA: Diagnosis not present

## 2023-01-06 DIAGNOSIS — Z833 Family history of diabetes mellitus: Secondary | ICD-10-CM

## 2023-01-06 DIAGNOSIS — D500 Iron deficiency anemia secondary to blood loss (chronic): Secondary | ICD-10-CM

## 2023-01-06 DIAGNOSIS — Z91040 Latex allergy status: Secondary | ICD-10-CM

## 2023-01-06 DIAGNOSIS — D631 Anemia in chronic kidney disease: Secondary | ICD-10-CM | POA: Diagnosis present

## 2023-01-06 DIAGNOSIS — D696 Thrombocytopenia, unspecified: Secondary | ICD-10-CM | POA: Diagnosis present

## 2023-01-06 DIAGNOSIS — Z961 Presence of intraocular lens: Secondary | ICD-10-CM | POA: Diagnosis present

## 2023-01-06 DIAGNOSIS — I959 Hypotension, unspecified: Secondary | ICD-10-CM | POA: Diagnosis present

## 2023-01-06 DIAGNOSIS — K648 Other hemorrhoids: Secondary | ICD-10-CM | POA: Diagnosis present

## 2023-01-06 HISTORY — PX: IR EMBO TUMOR ORGAN ISCHEMIA INFARCT INC GUIDE ROADMAPPING: IMG5449

## 2023-01-06 HISTORY — PX: IR PARACENTESIS: IMG2679

## 2023-01-06 LAB — CBC WITH DIFFERENTIAL/PLATELET
Abs Immature Granulocytes: 0 10*3/uL (ref 0.00–0.07)
Abs Immature Granulocytes: 0.02 10*3/uL (ref 0.00–0.07)
Basophils Absolute: 0 10*3/uL (ref 0.0–0.1)
Basophils Absolute: 0 10*3/uL (ref 0.0–0.1)
Basophils Relative: 1 %
Basophils Relative: 0 %
Eosinophils Absolute: 0.6 10*3/uL — ABNORMAL HIGH (ref 0.0–0.5)
Eosinophils Absolute: 0.5 10*3/uL (ref 0.0–0.5)
Eosinophils Relative: 17 %
Eosinophils Relative: 14 %
HCT: 18.2 % — ABNORMAL LOW (ref 36.0–46.0)
HCT: 22.2 % — ABNORMAL LOW (ref 36.0–46.0)
Hemoglobin: 5.5 g/dL — ABNORMAL LOW (ref 12.0–15.0)
Hemoglobin: 6.8 g/dL — ABNORMAL LOW (ref 12.0–15.0)
Immature Granulocytes: 0 %
Immature Granulocytes: 1 %
Lymphocytes Relative: 22 %
Lymphocytes Relative: 24 %
Lymphs Abs: 0.7 10*3/uL (ref 0.7–4.0)
Lymphs Abs: 0.8 10*3/uL (ref 0.7–4.0)
MCH: 23.9 pg — ABNORMAL LOW (ref 26.0–34.0)
MCH: 23.8 pg — ABNORMAL LOW (ref 26.0–34.0)
MCHC: 30.2 g/dL (ref 30.0–36.0)
MCHC: 30.6 g/dL (ref 30.0–36.0)
MCV: 79.1 fL — ABNORMAL LOW (ref 80.0–100.0)
MCV: 77.6 fL — ABNORMAL LOW (ref 80.0–100.0)
Monocytes Absolute: 0.4 10*3/uL (ref 0.1–1.0)
Monocytes Absolute: 0.3 10*3/uL (ref 0.1–1.0)
Monocytes Relative: 11 %
Monocytes Relative: 10 %
Neutro Abs: 1.7 10*3/uL (ref 1.7–7.7)
Neutro Abs: 1.8 10*3/uL (ref 1.7–7.7)
Neutrophils Relative %: 49 %
Neutrophils Relative %: 51 %
Platelets: 101 10*3/uL — ABNORMAL LOW (ref 150–400)
Platelets: 115 10*3/uL — ABNORMAL LOW (ref 150–400)
RBC: 2.3 MIL/uL — ABNORMAL LOW (ref 3.87–5.11)
RBC: 2.86 MIL/uL — ABNORMAL LOW (ref 3.87–5.11)
RDW: 20.1 % — ABNORMAL HIGH (ref 11.5–15.5)
RDW: 19.8 % — ABNORMAL HIGH (ref 11.5–15.5)
WBC: 3.3 10*3/uL — ABNORMAL LOW (ref 4.0–10.5)
WBC: 3.4 10*3/uL — ABNORMAL LOW (ref 4.0–10.5)
nRBC: 0 % (ref 0.0–0.2)
nRBC: 0 % (ref 0.0–0.2)

## 2023-01-06 LAB — PREPARE RBC (CROSSMATCH)

## 2023-01-06 LAB — BASIC METABOLIC PANEL
Anion gap: 6 (ref 5–15)
BUN: 43 mg/dL — ABNORMAL HIGH (ref 8–23)
CO2: 23 mmol/L (ref 22–32)
Calcium: 8.5 mg/dL — ABNORMAL LOW (ref 8.9–10.3)
Chloride: 109 mmol/L (ref 98–111)
Creatinine, Ser: 1.43 mg/dL — ABNORMAL HIGH (ref 0.44–1.00)
GFR, Estimated: 41 mL/min — ABNORMAL LOW (ref >60)
Glucose, Bld: 122 mg/dL — ABNORMAL HIGH (ref 70–99)
Potassium: 4.5 mmol/L (ref 3.5–5.1)
Sodium: 138 mmol/L (ref 135–145)

## 2023-01-06 LAB — RETICULOCYTES
Immature Retic Fract: 12.2 % (ref 2.3–15.9)
RBC.: 2.99 MIL/uL — ABNORMAL LOW (ref 3.87–5.11)
Retic Count, Absolute: 196.7 10*3/uL — ABNORMAL HIGH (ref 19.0–186.0)
Retic Ct Pct: 6.6 % — ABNORMAL HIGH (ref 0.4–3.1)

## 2023-01-06 LAB — TYPE AND SCREEN: Unit division: 0

## 2023-01-06 LAB — APTT: aPTT: 34 seconds (ref 24–36)

## 2023-01-06 LAB — BPAM RBC: Unit Type and Rh: 6200

## 2023-01-06 LAB — PROTIME-INR
INR: 1.4 — ABNORMAL HIGH (ref 0.8–1.2)
Prothrombin Time: 17.2 seconds — ABNORMAL HIGH (ref 11.4–15.2)

## 2023-01-06 MED ORDER — MIDAZOLAM HCL 2 MG/2ML IJ SOLN
INTRAMUSCULAR | Status: AC
  Filled 2023-01-06: qty 2

## 2023-01-06 MED ORDER — LIDOCAINE-PRILOCAINE 2.5-2.5 % EX CREA
TOPICAL_CREAM | CUTANEOUS | Status: AC
  Filled 2023-01-06: qty 5

## 2023-01-06 MED ORDER — MIDAZOLAM HCL 5 MG/5ML IJ SOLN
INTRAMUSCULAR | Status: AC | PRN
  Administered 2023-01-06: .5 mg via INTRAVENOUS

## 2023-01-06 MED ORDER — NITROGLYCERIN 2 % TD OINT
0.5000 [in_us] | TOPICAL_OINTMENT | TRANSDERMAL | Status: AC
  Administered 2023-01-06: 0.5 [in_us] via TOPICAL

## 2023-01-06 MED ORDER — ONDANSETRON HCL 4 MG/2ML IJ SOLN
INTRAMUSCULAR | Status: AC
  Filled 2023-01-06: qty 2

## 2023-01-06 MED ORDER — NITROGLYCERIN 1 MG/10 ML FOR IR/CATH LAB
200.0000 ug | Freq: Once | INTRA_ARTERIAL | Status: AC
  Administered 2023-01-06: 200 ug via INTRA_ARTERIAL
  Filled 2023-01-06: qty 10

## 2023-01-06 MED ORDER — FENTANYL CITRATE (PF) 100 MCG/2ML IJ SOLN
INTRAMUSCULAR | Status: AC
  Filled 2023-01-06: qty 2

## 2023-01-06 MED ORDER — LIDOCAINE HCL 1 % IJ SOLN
INTRAMUSCULAR | Status: AC
  Filled 2023-01-06: qty 20

## 2023-01-06 MED ORDER — NITROGLYCERIN 2 % TD OINT
TOPICAL_OINTMENT | TRANSDERMAL | Status: AC
  Filled 2023-01-06: qty 1

## 2023-01-06 MED ORDER — MORPHINE SULFATE (PF) 2 MG/ML IV SOLN
INTRAVENOUS | Status: AC
  Filled 2023-01-06: qty 1

## 2023-01-06 MED ORDER — MORPHINE SULFATE (PF) 2 MG/ML IV SOLN
2.0000 mg | Freq: Once | INTRAVENOUS | Status: AC
  Administered 2023-01-06: 2 mg via INTRAVENOUS
  Filled 2023-01-06: qty 1

## 2023-01-06 MED ORDER — ACETAMINOPHEN 650 MG RE SUPP: 650.0000 mg | Freq: Four times a day (QID) | RECTAL | Status: DC | PRN

## 2023-01-06 MED ORDER — MIDAZOLAM HCL 2 MG/2ML IJ SOLN
INTRAMUSCULAR | Status: AC | PRN
  Administered 2023-01-06: 1 mg via INTRAVENOUS

## 2023-01-06 MED ORDER — ONDANSETRON HCL 4 MG/2ML IJ SOLN
4.0000 mg | Freq: Three times a day (TID) | INTRAMUSCULAR | Status: DC | PRN
  Administered 2023-01-06: 4 mg via INTRAVENOUS

## 2023-01-06 MED ORDER — FENTANYL CITRATE (PF) 100 MCG/2ML IJ SOLN
INTRAMUSCULAR | Status: AC | PRN
  Administered 2023-01-06: 25 ug via INTRAVENOUS
  Administered 2023-01-06: 50 ug via INTRAVENOUS

## 2023-01-06 MED ORDER — HEPARIN SODIUM (PORCINE) 1000 UNIT/ML IJ SOLN
INTRAMUSCULAR | Status: AC
  Filled 2023-01-06: qty 10

## 2023-01-06 MED ORDER — VERAPAMIL HCL 2.5 MG/ML IV SOLN
INTRAVENOUS | Status: AC
  Filled 2023-01-06: qty 2

## 2023-01-06 MED ORDER — SODIUM CHLORIDE 0.9% FLUSH
3.0000 mL | Freq: Two times a day (BID) | INTRAVENOUS | Status: DC
  Administered 2023-01-06 – 2023-01-19 (×22): 3 mL via INTRAVENOUS

## 2023-01-06 MED ORDER — HEPARIN SODIUM (PORCINE) 1000 UNIT/ML IJ SOLN
3000.0000 [IU] | Freq: Once | INTRAMUSCULAR | Status: AC
  Administered 2023-01-06: 3000 [IU] via INTRA_ARTERIAL

## 2023-01-06 MED ORDER — LIDOCAINE-PRILOCAINE 2.5-2.5 % EX CREA: TOPICAL_CREAM | Freq: Once | CUTANEOUS | Status: AC

## 2023-01-06 MED ORDER — LIDOCAINE HCL 1 % IJ SOLN: 10.0000 mL | Freq: Once | INTRAMUSCULAR | Status: DC

## 2023-01-06 MED ORDER — DM-GUAIFENESIN ER 30-600 MG PO TB12
1.0000 | ORAL_TABLET | Freq: Two times a day (BID) | ORAL | Status: DC | PRN
  Filled 2023-01-06: qty 1

## 2023-01-06 MED ORDER — FENTANYL CITRATE (PF) 100 MCG/2ML IJ SOLN
INTRAMUSCULAR | Status: AC | PRN
  Administered 2023-01-06: 25 ug via INTRAVENOUS

## 2023-01-06 MED ORDER — IOHEXOL 300 MG/ML  SOLN
117.0000 mL | Freq: Once | INTRAMUSCULAR | Status: AC | PRN
  Administered 2023-01-06: 117 mL via INTRA_ARTERIAL

## 2023-01-06 MED ORDER — SODIUM CHLORIDE 0.9 % IV SOLN
12.5000 mg | Freq: Four times a day (QID) | INTRAVENOUS | Status: DC | PRN
  Filled 2023-01-06: qty 0.5

## 2023-01-06 MED ORDER — SODIUM CHLORIDE 0.9 % IV SOLN: INTRAVENOUS | Status: DC

## 2023-01-06 MED ORDER — ACETAMINOPHEN 325 MG PO TABS
650.0000 mg | ORAL_TABLET | Freq: Four times a day (QID) | ORAL | Status: DC | PRN
  Administered 2023-01-06 – 2023-01-18 (×16): 650 mg via ORAL
  Filled 2023-01-06 (×16): qty 2

## 2023-01-06 MED ORDER — VERAPAMIL HCL 2.5 MG/ML IV SOLN
5.0000 mg | Freq: Once | INTRAVENOUS | Status: AC
  Administered 2023-01-06: 5 mg via INTRA_ARTERIAL

## 2023-01-06 MED ORDER — SODIUM CHLORIDE 0.9% IV SOLUTION: Freq: Once | INTRAVENOUS | Status: AC

## 2023-01-06 MED ORDER — ALBUTEROL SULFATE HFA 108 (90 BASE) MCG/ACT IN AERS
2.0000 | INHALATION_SPRAY | RESPIRATORY_TRACT | Status: DC | PRN
  Filled 2023-01-06: qty 6.7

## 2023-01-06 MED ORDER — ONDANSETRON HCL 4 MG/2ML IJ SOLN
4.0000 mg | Freq: Once | INTRAMUSCULAR | Status: AC
  Administered 2023-01-06: 4 mg via INTRAVENOUS

## 2023-01-06 MED ORDER — NITROGLYCERIN 1 MG/10 ML FOR IR/CATH LAB
INTRA_ARTERIAL | Status: AC
  Filled 2023-01-06: qty 10

## 2023-01-06 MED ORDER — MIDODRINE HCL 5 MG PO TABS
10.0000 mg | ORAL_TABLET | Freq: Three times a day (TID) | ORAL | Status: DC
  Administered 2023-01-06 – 2023-01-12 (×10): 10 mg via ORAL
  Filled 2023-01-06 (×12): qty 2

## 2023-01-06 NOTE — Assessment & Plan Note (Signed)
Pain due to splenic artery embolization.  Seems to be mild to moderate at this time.  Patient requesting Tylenol we will start there.  Patient still seems to be slightly sedated from her sedation, therefore will defer starting opiates at this time.  However no objection once the patient wakes up to give her opiates as well if needed.

## 2023-01-06 NOTE — H&P (Signed)
Chief Complaint: Patient was seen in consultation today for partial splenic artery embolization  Referring Physician(s): Suttle,Dylan J  Supervising Physician: Marliss Coots  Patient Status: [facility] - Out-pt  History of Present Illness: Rachel Huber is a 67 y.o. 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 The patient is known to IR from paracentesis procedures, "emborrhoid" procedure performed 02/07/22, and angiograms with rectal artery embolizations on 09/05/22 and 11/05/22. Patient additionally has had a port placed on 11/25/22 to facilitate her near-weekly blood transfusions to treat her anemia. Patient has been seen by Dr Elby Showers on 12/12/22 to discuss the possibility of partial splenic artery embolization to "improve her hematologic profiles as well as decrease portal hypertension."  Past Medical History:  Diagnosis Date   Anemia in chronic kidney disease (CKD) 02/07/2022   Anxiety    Arthritis    Ascites of liver    Weekly paracentesis   Cirrhosis of liver (HCC)    CKD (chronic kidney disease) stage 4, GFR 15-29 ml/min (HCC)    GERD (gastroesophageal reflux disease)    Grade IV internal hemorrhoids    Hypertension    NASH (nonalcoholic steatohepatitis)    Neuropathy, diabetic (HCC)    Obesity (BMI 30-39.9)    OSA on CPAP    Pneumonia    Portal hypertension (HCC)    S/P TIPS (transjugular intrahepatic portosystemic shunt)    Thalassemia minor 1992    Past Surgical History:  Procedure Laterality Date   ABDOMINAL HYSTERECTOMY     BREAST BIOPSY Right 02/15/2018   [place] bx 6-6:30 ribbon shape, ONE CORE FRAGMENT WITH FIBROSIS. ONE CORE FRAGMENT WITH PORTION OF A DILATED   BREAST BIOPSY Right 02/15/2018   [place] bx 9:00  heart shape, USUAL DUCTAL HYPERPLASIA   BREAST LUMPECTOMY Right 03/09/2018   Procedure: BREAST LUMPECTOMY x 2;  Surgeon: Sung Amabile, DO;  Location: [facility] ORS;  Service: General;  Laterality: Right;   CATARACT EXTRACTION W/PHACO Left 06/11/2016   Procedure: CATARACT EXTRACTION PHACO AND INTRAOCULAR LENS PLACEMENT (IOC);  Surgeon: Sallee Lange, MD;  Location: [facility] ORS;  Service: Ophthalmology;  Laterality: Left;  Lot # [id number] H US:01:38.6 AP%:26.4 CDE:44.15   CATARACT EXTRACTION W/PHACO Right 06/15/2018   Procedure: CATARACT EXTRACTION PHACO AND INTRAOCULAR LENS PLACEMENT (IOC);  Surgeon: Galen Manila, MD;  Location: [facility] ORS;  Service: Ophthalmology;  Laterality: Right;  [place] 00:38.2 CDE 4.23 Fluid Pack Lot # [id number]   COLONOSCOPIES     COLONOSCOPY WITH PROPOFOL N/A 10/10/2020   Procedure: COLONOSCOPY WITH PROPOFOL;  Surgeon: Regis Bill, MD;  Location: [facility] ENDOSCOPY;  Service: Endoscopy;  Laterality: N/A;   COLONOSCOPY WITH PROPOFOL N/A 11/20/2020   Procedure: COLONOSCOPY WITH PROPOFOL;  Surgeon: Regis Bill, MD;  Location: [facility] ENDOSCOPY;  Service: Endoscopy;  Laterality: N/A;  DM STAT CBC, BMP COVID POSITIVE 09/02/2020   COLONOSCOPY WITH PROPOFOL N/A 06/19/2022   Procedure: COLONOSCOPY WITH PROPOFOL;  Surgeon: Regis Bill, MD;  Location: [facility] ENDOSCOPY;  Service: Endoscopy;  Laterality: N/A;   CTR     ESOPHAGOGASTRODUODENOSCOPY (EGD) WITH PROPOFOL N/A 10/10/2020   Procedure: ESOPHAGOGASTRODUODENOSCOPY (EGD) WITH PROPOFOL;  Surgeon: Regis Bill, MD;  Location: [facility] ENDOSCOPY;  Service: Endoscopy;  Laterality: N/A;  COVID POSITIVE 10/08/2020   FLEXIBLE SIGMOIDOSCOPY N/A 02/06/2022   Procedure: FLEXIBLE SIGMOIDOSCOPY;  Surgeon: Mia Creek,  Rossie Muskrat, MD;  Location: [facility] ENDOSCOPY;  Service: Endoscopy;  Laterality: N/A;  Patient requests anesthesia   FLEXIBLE SIGMOIDOSCOPY N/A 12/19/2022   Procedure: FLEXIBLE SIGMOIDOSCOPY;  Surgeon: Dolores Frame,  MD;  Location: [facility];  Service: Gastroenterology;  Laterality: N/A;   IR ANGIOGRAM SELECTIVE EACH ADDITIONAL VESSEL  02/07/2022   IR ANGIOGRAM SELECTIVE EACH ADDITIONAL VESSEL  02/07/2022   IR ANGIOGRAM SELECTIVE EACH ADDITIONAL VESSEL  09/05/2022   IR ANGIOGRAM VISCERAL SELECTIVE  02/07/2022   IR ANGIOGRAM VISCERAL SELECTIVE  09/05/2022   IR EMBO ART  VEN HEMORR LYMPH EXTRAV  INC GUIDE ROADMAPPING  09/05/2022   IR EMBO ART  VEN HEMORR LYMPH EXTRAV  INC GUIDE ROADMAPPING  11/05/2022   IR EMBO ARTERIAL NOT HEMORR HEMANG INC GUIDE ROADMAPPING  02/07/2022   IR IMAGING GUIDED PORT INSERTION  06/13/2022   IR IMAGING GUIDED PORT INSERTION  11/25/2022   IR PARACENTESIS  12/17/2021   IR PARACENTESIS  03/25/2022   IR PARACENTESIS  11/25/2022   IR RADIOLOGIST EVAL & MGMT  03/18/2022   IR RADIOLOGIST EVAL & MGMT  05/07/2022   IR RADIOLOGIST EVAL & MGMT  05/16/2022   IR RADIOLOGIST EVAL & MGMT  10/13/2022   IR RADIOLOGIST EVAL & MGMT  12/12/2022   IR US GUIDE VASC ACCESS RIGHT  02/07/2022   IR US GUIDE VASC ACCESS RIGHT  09/05/2022   JOINT REPLACEMENT     KNEE SURGERY Right 09/24/2017   plates and pins   PORT-A-CATH REMOVAL N/A 09/23/2022   Procedure: MINOR REMOVAL PORT-A-CATH;  Surgeon: Franky Macho, MD;  Location: [facility] ORS;  Service: General;  Laterality: N/A;   TEE WITHOUT CARDIOVERSION N/A 09/23/2022   Procedure: TRANSESOPHAGEAL ECHOCARDIOGRAM (TEE);  Surgeon: Antoine Poche, MD;  Location: [facility] ORS;  Service: Endoscopy;  Laterality: N/A;   TOTAL SHOULDER ARTHROPLASTY Right 09/27/2015    Allergies: Gramineae pollens and Latex  Medications: Prior to Admission medications   Medication Sig Start Date End Date Taking? Authorizing Provider  acetaminophen (TYLENOL) 650 MG CR tablet Take 1,300 mg by mouth daily as needed for pain.    [provider]  albuterol (VENTOLIN HFA) 108 (90 Base) MCG/ACT inhaler Inhale 2 puffs into the lungs every 6 (six) hours as needed for wheezing or shortness of breath.  02/10/22   Alford Highland, MD  atorvastatin (LIPITOR) 10 MG tablet Take 1 tablet (10 mg total) by mouth daily. 06/20/22   Enedina Finner, MD  cyanocobalamin (VITAMIN B12) 1000 MCG tablet Take 1 tablet (1,000 mcg total) by mouth daily. 12/17/22   Carnella Guadalajara, PA-C  diphenhydrAMINE (BENADRYL) 25 MG tablet Take 25 mg by mouth 2 (two) times daily as needed for itching.    [provider]  ergocalciferol (VITAMIN D2) 1.25 MG (50000 UT) capsule Take 1 capsule (50,000 Units total) by mouth every 14 (fourteen) days. 09/01/22   Carnella Guadalajara, PA-C  fluconazole (DIFLUCAN) 150 MG tablet Take 1 tablet (150 mg total) by mouth daily. X one dose 12/20/22   Tat, Onalee Hua, MD  gabapentin (NEURONTIN) 400 MG capsule Take 400 mg by mouth 2 (two) times daily. 01/07/18   [provider]  melatonin 5 MG TABS Take 10 mg by mouth at bedtime as needed (sleep).    [provider]  midodrine (PROAMATINE) 10 MG tablet Take 10 mg by mouth 3 (three) times daily.    [provider]  Multiple Vitamins-Minerals (MULTIVITAMIN WITH MINERALS) tablet Take 1 tablet by mouth daily.  [provider]  oxymetazoline (AFRIN) 0.05 % nasal spray Place 1 spray into both nostrils daily as needed for congestion.    [provider]  polyethylene glycol (MIRALAX / GLYCOLAX) 17 g packet Take 17 g by mouth 2 (two) times daily. 06/20/22   Enedina Finner, MD  sertraline (ZOLOFT) 50 MG tablet Take 50 mg by mouth daily. 12/12/17   [provider]  spironolactone (ALDACTONE) 25 MG tablet Take 1 tablet (25 mg total) by mouth daily. 10/26/22   Catarina Hartshorn, MD  torsemide 40 MG TABS Take 40 mg by mouth daily. 10/26/22   Catarina Hartshorn, MD     Family History  Problem Relation Age of Onset   Breast cancer Mother 23   Lymphoma Mother    Diabetes Father    Kidney cancer Father    Heart disease Father    Diabetes Sister    Breast cancer Sister 90    Social History   Socioeconomic History    Marital status: Married    Spouse name: Not on file   Number of children: 0   Years of education: Not on file   Highest education level: Not on file  Occupational History   Occupation: EMPLOYED  Tobacco Use   Smoking status: Never   Smokeless tobacco: Never  Vaping Use   Vaping Use: Never used  Substance and Sexual Activity   Alcohol use: No   Drug use: Never   Sexual activity: Not Currently  Other Topics Concern   Not on file  Social History Narrative   Not on file   Social Determinants of Health   Financial Resource Strain: Low Risk  (04/04/2022)   Overall Financial Resource Strain (CARDIA)    Difficulty of Paying Living Expenses: Not hard at all  Food Insecurity: No Food Insecurity (12/17/2022)   Hunger Vital Sign    Worried About Running Out of Food in the Last Year: Never true    Ran Out of Food in the Last Year: Never true  Transportation Needs: No Transportation Needs (12/17/2022)   PRAPARE - Administrator, Civil Service (Medical): No    Lack of Transportation (Non-Medical): No  Physical Activity: Inactive (04/04/2022)   Exercise Vital Sign    Days of Exercise per Week: 0 days    Minutes of Exercise per Session: 0 min  Stress: No Stress Concern Present (04/04/2022)   [employer] of Occupational Health - Occupational Stress Questionnaire    Feeling of Stress : Only a little  Social Connections: Moderately Isolated (04/04/2022)   Social Connection and Isolation Panel [NHANES]    Frequency of Communication with Friends and Family: More than three times a week    Frequency of Social Gatherings with Friends and Family: Three times a week    Attends Religious Services: Never    Active Member of Clubs or Organizations: No    Attends Banker Meetings: Never    Marital Status: Married    Code Status: Full Code   Review of Systems: A 12 point ROS discussed and pertinent positives are indicated in the HPI above.  All other systems are  negative.  Review of Systems  Constitutional:  Negative for chills and fever.  Respiratory:  Negative for chest tightness and shortness of breath.   Cardiovascular:  Positive for leg swelling. Negative for chest pain.  Gastrointestinal:  Positive for nausea. Negative for abdominal pain, diarrhea and vomiting.       Pt states she is nauseous  from not eating breakfast  Neurological:  Negative for dizziness and headaches.  Psychiatric/Behavioral:  Negative for confusion.     Vital Signs: BP (!) 121/44   Pulse 66   Temp 97.9 F (36.6 C) (Oral)   Resp 20   Ht 5\' 9"  (1.753 m)   Wt 260 lb (117.9 kg)   SpO2 99%   BMI 38.40 kg/m   Advance Care Plan: The advanced care plan/surrogate decision maker was discussed at the time of visit and documented in the medical record.    Physical Exam Vitals reviewed.  Constitutional:      General: She is not in acute distress. Cardiovascular:     Rate and Rhythm: Normal rate and regular rhythm.     Pulses: Normal pulses.     Heart sounds: Normal heart sounds.  Pulmonary:     Effort: Pulmonary effort is normal.     Breath sounds: Normal breath sounds.  Abdominal:     Palpations: Abdomen is soft.     Tenderness: There is no abdominal tenderness.  Musculoskeletal:     Right lower leg: Edema present.     Left lower leg: Edema present.  Skin:    General: Skin is warm and dry.  Neurological:     Mental Status: She is alert and oriented to person, place, and time.  Psychiatric:        Mood and Affect: Mood normal.        Behavior: Behavior normal.        Thought Content: Thought content normal.        Judgment: Judgment normal.     Imaging: US Paracentesis  Result Date: 12/30/2022 INDICATION: 68 year old female. History of NASH cirrhosis with recurrent ascites despite tips creation and revision performed at outside hospital. Patient presents for therapeutic paracentesis. Maximum 8 L EXAM: ULTRASOUND GUIDED THERAPEUTIC PARACENTESIS  MEDICATIONS: Lidocaine 1% 10 mL COMPLICATIONS: None immediate. PROCEDURE: Informed written consent was obtained from the patient after a discussion of the risks, benefits and alternatives to treatment. A timeout was performed prior to the initiation of the procedure. Initial ultrasound scanning demonstrates a moderate amount of ascites within the right lower abdominal quadrant. The right lower abdomen was prepped and draped in the usual sterile fashion. 1% lidocaine was used for local anesthesia. Following this, a 6 Fr Safe-T-Centesis catheter was introduced. An ultrasound image was saved for documentation purposes. The paracentesis was performed. The catheter was removed and a dressing was applied. The patient tolerated the procedure well without immediate post procedural complication. Patient received post-procedure intravenous albumin; see nursing notes for details. FINDINGS: A total of approximately 5.7 L of straw-colored fluid was removed. IMPRESSION: Successful ultrasound-guided therapeutic paracentesis yielding 5.7 liters of peritoneal fluid. Performed by: Anders Grant, NP Electronically Signed   By: Irish Lack M.D.   On: 12/30/2022 12:32   US Paracentesis  Result Date: 12/23/2022 INDICATION: 67 year old female. History of NASH cirrhosis with recurrent ascites despite TIPS creation and revision performed at outside hospital. Patient presents for therapeutic paracentesis. Maximum 8 L EXAM: ULTRASOUND GUIDED THERAPEUTIC  PARACENTESIS MEDICATIONS: None. COMPLICATIONS: None immediate. PROCEDURE: Informed written consent was obtained from the patient after a discussion of the risks, benefits and alternatives to treatment. A timeout was performed prior to the initiation of the procedure. Initial ultrasound scanning demonstrates a large amount of ascites within the right lower abdominal quadrant. The right lower abdomen was prepped and draped in the usual sterile fashion. 1% lidocaine was used for  local anesthesia. Following  this, a 6 Fr Safe-T-Centesis catheter was introduced. An ultrasound image was saved for documentation purposes. The paracentesis was performed. The catheter was removed and a dressing was applied. The patient tolerated the procedure well without immediate post procedural complication. FINDINGS: A total of approximately 6.3 L of straw-colored fluid was removed. IMPRESSION: Successful ultrasound-guided therapeutic paracentesis yielding 6.3 liters of peritoneal fluid. Read by: Anders Grant, NP Electronically Signed   By: Olive Bass M.D.   On: 12/23/2022 13:07   US Paracentesis  Result Date: 12/16/2022 INDICATION: History of NASH cirrhosis with recurrent symptomatic ascites despite TIPS creation and revision (tips creation and revision performed at [facility]. Patient currently being evaluated by Dr. Elby Showers for consideration splenic embolization Patient presents today for routine ultrasound-guided paracentesis with max volume of 8 L. EXAM: ULTRASOUND-GUIDED PARACENTESIS COMPARISON:  Multiple previous ultrasound-guided paracenteses, most recently on 12/09/2022 yielding 5.8 L of peritoneal fluid. MEDICATIONS: None. COMPLICATIONS: None immediate. TECHNIQUE: Informed written consent was obtained from the patient after a discussion of the risks, benefits and alternatives to treatment. A timeout was performed prior to the initiation of the procedure. Initial ultrasound scanning demonstrates a moderate amount of ascites within the abdomen with dominant pocket located within the right mid abdomen. Right mid abdomen was subsequently prepped and draped in usual sterile fashion. 1% lidocaine with epinephrine was used for local anesthesia. Under direct ultrasound guidance, an 8 Fr Safe-T-Centesis catheter was introduced. Multiple ultrasound images were saved procedural documentation purposes. The paracentesis was performed. The catheter was removed and a dressing was  applied. The patient tolerated the procedure well without immediate post procedural complication. FINDINGS: A total of approximately 8 liters of serous fluid was removed. IMPRESSION: Successful ultrasound-guided paracentesis yielding 8 liters of peritoneal fluid. Electronically Signed   By: Simonne Come M.D.   On: 12/16/2022 12:21   IR Radiologist Eval & Mgmt  Result Date: 12/12/2022 EXAM: ESTABLISHED PATIENT OFFICE VISIT CHIEF COMPLAINT: See Epic note. HISTORY OF PRESENT ILLNESS: See Epic note. REVIEW OF SYSTEMS: See Epic note. PHYSICAL EXAMINATION: See Epic note. ASSESSMENT AND PLAN: See Epic note. Marliss Coots, MD [facility] Electronically Signed   By: Marliss Coots M.D.   On: 12/12/2022 12:42   US Paracentesis  Result Date: 12/09/2022 INDICATION: Patient with a history of cirrhosis with recurrent ascites. Patient is status post TIPS and TIPS revision at [facility]. Interventional radiology asked to perform a therapeutic paracentesis. EXAM: ULTRASOUND GUIDED PARACENTESIS MEDICATIONS: 1% lidocaine 20 mL COMPLICATIONS: None immediate. PROCEDURE: Informed written consent was obtained from the patient after a discussion of the risks, benefits and alternatives to treatment. A timeout was performed prior to the initiation of the procedure. Initial ultrasound scanning demonstrates a large amount of ascites within the right lower abdominal quadrant. The right lower abdomen was prepped and draped in the usual sterile fashion. 1% lidocaine was used for local anesthesia. Following this, a 19 gauge, 7-cm, Yueh catheter was introduced. An ultrasound image was saved for documentation purposes. The paracentesis was performed. The catheter was removed and a dressing was applied. The patient tolerated the procedure well without immediate post procedural complication. Patient received post-procedure intravenous albumin; see nursing notes for details. FINDINGS: A total of  approximately 5.8 L of clear yellow fluid was removed. IMPRESSION: Successful ultrasound-guided paracentesis yielding 5.8 liters of peritoneal fluid. Read by: Alwyn Ren, NP PLAN: Patient is status post TIPS and TIPS revision at [facility]. Patient is still followed by [facility]. Electronically Signed  By: Olive Bass M.D.   On: 12/09/2022 12:53    Labs:  CBC: Recent Labs    12/18/22 0523 12/18/22 0725 12/18/22 1834 12/19/22 0413 12/20/22 0423 12/23/22 0844  WBC 3.6*  --   --  3.9* 3.6* 4.1  HGB 6.8*   < > 7.7* 7.9* 7.4* 7.8*  HCT 20.6*   < > 23.4* 24.3* 23.5* 24.9*  PLT 125*  --   --  132* 128* 105*   < > = values in this interval not displayed.    COAGS: Recent Labs    09/17/22 2111 09/18/22 1253 09/19/22 0359 11/03/22 0827 11/17/22 0114  INR 1.4* 1.4*  --  1.4* 1.1  APTT 34 35 36  --  33    BMP: Recent Labs    12/17/22 1132 12/18/22 0523 12/19/22 0413 12/20/22 0423  NA 134* 136 138 138  K 4.1 4.2 4.3 3.7  CL 109 110 110 112*  CO2 20* 22 22 22   GLUCOSE 182* 175* 135* 267*  BUN 38* 34* 35* 36*  CALCIUM 7.9* 7.9* 7.9* 7.3*  CREATININE 1.70* 1.49* 1.60* 1.52*  GFRNONAA 33* 39* 36* 38*    LIVER FUNCTION TESTS: Recent Labs    11/04/22 0432 11/17/22 0114 12/10/22 1003 12/18/22 0523  BILITOT 2.6* 1.5* 1.4* 1.3*  AST 35 52* 45* 33  ALT 13 21 24 18   ALKPHOS 65 122 129* 106  PROT 4.0* 5.0* 4.8* 3.9*  ALBUMIN 2.1* 2.5* 2.3* 2.1*    TUMOR MARKERS: No results for input(s): "AFPTM", "CEA", "CA199", "CHROMGRNA" in the last 8760 hours.  Assessment and Plan:  Sundai Gosha is a 67 yo female being seen today in relation to partial splenic artery embolization to improve her hematologic profiles. Patient will be admitted for observation following procedure to ensure she tolerates the procedure well. She presents today in her usual state of health and is NPO. Labs are pending. Case has been reviewed and approved by Dr Elby Showers.  The Risks and benefits of embolization  were discussed with the patient including, but not limited to bleeding, infection, vascular injury, post operative pain, or contrast induced renal failure.  This procedure involves the use of X-rays and because of the nature of the planned procedure, it is possible that we will have prolonged use of X-ray fluoroscopy.  Potential radiation risks to you include (but are not limited to) the following: - A slightly elevated risk for cancer several years later in life. This risk is typically less than 0.5% percent. This risk is low in comparison to the normal incidence of human cancer, which is 33% for women and 50% for men according to the American Cancer Society. - Radiation induced injury can include skin redness, resembling a rash, tissue breakdown / ulcers and hair loss (which can be temporary or permanent).   The likelihood of either of these occurring depends on the difficulty of the procedure and whether you are sensitive to radiation due to previous procedures, disease, or genetic conditions.   IF your procedure requires a prolonged use of radiation, you will be notified and given written instructions for further action.  It is your responsibility to monitor the irradiated area for the 2 weeks following the procedure and to notify your physician if you are concerned that you have suffered a radiation induced injury.    All of the patient's questions were answered, patient is agreeable to proceed. Consent signed and in chart.   Thank you for this interesting consult.  I greatly  enjoyed meeting Don Broach and look forward to participating in their care.  A copy of this report was sent to the requesting provider on this date.  Electronically Signed: Kennieth Francois, PA-C 01/06/2023, 8:42 AM   I spent a total of  25 Minutes in face to face in clinical consultation, greater than 50% of which was counseling/coordinating care for partial splenic embolization.

## 2023-01-06 NOTE — Progress Notes (Signed)
Patient has been nauseous, gave zofran x2, still nauseous. Provider notified and will come to assess.

## 2023-01-06 NOTE — Assessment & Plan Note (Signed)
Has had chronic anemia actually pancytopenia and has been following up oncology.  There are multiple factors for anemia including bleeding from internal hemorrhoids as well as splenic sequestration.  Requiring multiple transfusions  S/p 1 unit, hemoglobin today was 8.3. Anemia panel with anemia of chronic disease and no specific deficiency. Protein electrophoresis pending. -Continue to monitor -Transfuse if below 7

## 2023-01-06 NOTE — Progress Notes (Signed)
Interventional Radiology Procedure Note  Procedure:  1) Ultrasound guided paracentesis 2) Partial splenic embolization   Findings: Please refer to procedural dictation for full description.  4 L drained during paracentesis.  Left radial artery approach to inferior pole of spleen embolization with particles and coil.  TR band applied.  Complications: None immediate  Estimated Blood Loss: < 5 mL  Recommendations: TR band protocol release. Given lab findings of Hb < 7 today, greatly appreciate Hospitalist assistance with admission for transfusion. Recommend dilaudid PCA for pain control. 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 IR will follow closely.   Marliss Coots, MD

## 2023-01-06 NOTE — Progress Notes (Signed)
Patient clinically stable post Splenic IR embolization per Dr Elby Showers. Tolerated well. Vitals stable. Awake/alert and oriented post procedure. Received Versed 2 mg along with Fentanyl 100 mcg IV for procedure. TR band with 14 ml air instilled per Dr Elby Showers in place to left radial. No bleeding nor hematoma at site. Report given to [facility] RN post procedure/12. Denies complaints of pain at this time.

## 2023-01-06 NOTE — Assessment & Plan Note (Signed)
Apparently these have been ? bleeding requiring multiple transfusions for patient.  Monitoring clinically.

## 2023-01-06 NOTE — H&P (Signed)
History and Physical    Patient: Rachel Huber WUJ:[id number] DOB: 12-24-1955 DOA: 01/06/2023 DOS: the patient was seen and examined on 01/06/2023 PCP: Lauro Regulus, MD  Patient coming from:  VIR suite  Chief Complaint: No chief complaint on file.  HPI: Rachel Huber is a 67 y.o. 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 Elby Showers of IR, and was found have worsening anemia.  We are asked to admit patient for observation and blood transfusion.  Is currently s/p   Procedure:  1) Ultrasound guided paracentesis 2) Partial splenic embolization (patient had been requiring almost weekly transfusions)  Patient at this time reports a new back pain that is unrelated to position.  It is mild and requesting Tylenol for same.  Patient still feels restful after her sedation/procedure.  Patient is not having any vomiting or diarrhea.  Patient does not report any gum bleeding skin bruising etc.  No melena.  Denies any chest pain shortness of breath presyncope.   Review of Systems: As mentioned in the history of present illness. All other systems reviewed and are negative. Past Medical History:  Diagnosis Date   Anemia in chronic kidney disease (CKD) 02/07/2022   Anxiety    Arthritis    Ascites of liver    Weekly paracentesis   Cirrhosis of liver (HCC)    CKD (chronic kidney disease) stage 4, GFR 15-29 ml/min (HCC)    GERD (gastroesophageal reflux disease)    Grade IV internal hemorrhoids    Hypertension    NASH (nonalcoholic steatohepatitis)    Neuropathy, diabetic (HCC)     Obesity (BMI 30-39.9)    OSA on CPAP    Pneumonia    Portal hypertension (HCC)    S/P TIPS (transjugular intrahepatic portosystemic shunt)    Thalassemia minor 1992   Past Surgical History:  Procedure Laterality Date   ABDOMINAL HYSTERECTOMY     BREAST BIOPSY Right 02/15/2018   [place] bx 6-6:30 ribbon shape, ONE CORE FRAGMENT WITH FIBROSIS. ONE CORE FRAGMENT WITH PORTION OF A DILATED   BREAST BIOPSY Right 02/15/2018   [place] bx 9:00 heart shape, USUAL DUCTAL HYPERPLASIA   BREAST LUMPECTOMY Right 03/09/2018   Procedure: BREAST LUMPECTOMY x 2;  Surgeon: Sung Amabile, DO;  Location: [facility] ORS;  Service: General;  Laterality: Right;   CATARACT EXTRACTION W/PHACO Left 06/11/2016   Procedure: CATARACT EXTRACTION PHACO AND INTRAOCULAR LENS PLACEMENT (IOC);  Surgeon: Sallee Lange, MD;  Location: [facility] ORS;  Service: Ophthalmology;  Laterality: Left;  Lot # [id number] H US:01:38.6 AP%:26.4 CDE:44.15   CATARACT EXTRACTION W/PHACO Right 06/15/2018   Procedure: CATARACT EXTRACTION PHACO AND INTRAOCULAR LENS PLACEMENT (IOC);  Surgeon: Galen Manila, MD;  Location: [facility] ORS;  Service: Ophthalmology;  Laterality: Right;  [place] 00:38.2 CDE 4.23 Fluid Pack Lot # [id number]   COLONOSCOPIES     COLONOSCOPY WITH PROPOFOL N/A 10/10/2020   Procedure: COLONOSCOPY WITH PROPOFOL;  Surgeon: Regis Bill, MD;  Location: [facility] ENDOSCOPY;  Service: Endoscopy;  Laterality: N/A;   COLONOSCOPY WITH PROPOFOL N/A 11/20/2020   Procedure: COLONOSCOPY WITH PROPOFOL;  Surgeon: Regis Bill, MD;  Location: [facility] ENDOSCOPY;  Service: Endoscopy;  Laterality: N/A;  DM STAT CBC, BMP COVID POSITIVE  09/02/2020   COLONOSCOPY WITH PROPOFOL N/A 06/19/2022   Procedure: COLONOSCOPY WITH PROPOFOL;  Surgeon: Regis Bill, MD;  Location: [facility] ENDOSCOPY;  Service: Endoscopy;  Laterality: N/A;   CTR     ESOPHAGOGASTRODUODENOSCOPY (EGD) WITH PROPOFOL N/A 10/10/2020   Procedure: ESOPHAGOGASTRODUODENOSCOPY (EGD) WITH PROPOFOL;   Surgeon: Regis Bill, MD;  Location: [facility] ENDOSCOPY;  Service: Endoscopy;  Laterality: N/A;  COVID POSITIVE 10/08/2020   FLEXIBLE SIGMOIDOSCOPY N/A 02/06/2022   Procedure: FLEXIBLE SIGMOIDOSCOPY;  Surgeon: Regis Bill, MD;  Location: [facility] ENDOSCOPY;  Service: Endoscopy;  Laterality: N/A;  Patient requests anesthesia   FLEXIBLE SIGMOIDOSCOPY N/A 12/19/2022   Procedure: FLEXIBLE SIGMOIDOSCOPY;  Surgeon: Dolores Frame, MD;  Location: [facility];  Service: Gastroenterology;  Laterality: N/A;   IR ANGIOGRAM SELECTIVE EACH ADDITIONAL VESSEL  02/07/2022   IR ANGIOGRAM SELECTIVE EACH ADDITIONAL VESSEL  02/07/2022   IR ANGIOGRAM SELECTIVE EACH ADDITIONAL VESSEL  09/05/2022   IR ANGIOGRAM VISCERAL SELECTIVE  02/07/2022   IR ANGIOGRAM VISCERAL SELECTIVE  09/05/2022   IR EMBO ART  VEN HEMORR LYMPH EXTRAV  INC GUIDE ROADMAPPING  09/05/2022   IR EMBO ART  VEN HEMORR LYMPH EXTRAV  INC GUIDE ROADMAPPING  11/05/2022   IR EMBO ARTERIAL NOT HEMORR HEMANG INC GUIDE ROADMAPPING  02/07/2022   IR EMBO TUMOR ORGAN ISCHEMIA INFARCT INC GUIDE ROADMAPPING  01/06/2023   IR IMAGING GUIDED PORT INSERTION  06/13/2022   IR IMAGING GUIDED PORT INSERTION  11/25/2022   IR PARACENTESIS  12/17/2021   IR PARACENTESIS  03/25/2022   IR PARACENTESIS  11/25/2022   IR PARACENTESIS  01/06/2023   IR RADIOLOGIST EVAL & MGMT  03/18/2022   IR RADIOLOGIST EVAL & MGMT  05/07/2022   IR RADIOLOGIST EVAL & MGMT  05/16/2022   IR RADIOLOGIST EVAL & MGMT  10/13/2022   IR RADIOLOGIST EVAL & MGMT  12/12/2022   IR US GUIDE VASC ACCESS RIGHT  02/07/2022   IR US GUIDE VASC ACCESS RIGHT  09/05/2022   JOINT REPLACEMENT     KNEE SURGERY Right 09/24/2017   plates and pins   PORT-A-CATH REMOVAL N/A 09/23/2022   Procedure: MINOR REMOVAL PORT-A-CATH;  Surgeon: Franky Macho, MD;  Location: [facility] ORS;  Service: General;  Laterality: N/A;   TEE WITHOUT CARDIOVERSION N/A 09/23/2022   Procedure: TRANSESOPHAGEAL ECHOCARDIOGRAM (TEE);  Surgeon: Antoine Poche, MD;  Location: [facility];  Service: Endoscopy;  Laterality: N/A;   TOTAL SHOULDER ARTHROPLASTY Right 09/27/2015   Social History:  reports that she has never smoked. She has never used smokeless tobacco. She reports that she does not drink alcohol and does not use drugs.  Allergies  Allergen Reactions   Gramineae Pollens Other (See Comments)    Sneezing, Running nose   Latex Rash    Contact rash    Family History  Problem Relation Age of Onset   Breast cancer Mother 38   Lymphoma Mother    Diabetes Father    Kidney cancer Father    Heart disease Father    Diabetes Sister    Breast cancer Sister 30    Prior to Admission medications   Medication Sig Start Date End Date Taking? Authorizing Provider  acetaminophen (TYLENOL) 650 MG CR tablet Take 1,300 mg by mouth daily as needed for pain.    [provider]  albuterol (VENTOLIN HFA) 108 (90 Base) MCG/ACT inhaler Inhale 2 puffs into the lungs every 6 (six) hours as needed for wheezing or shortness of breath. 02/10/22  Alford Highland, MD  atorvastatin (LIPITOR) 10 MG tablet Take 1 tablet (10 mg total) by mouth daily. 06/20/22   Enedina Finner, MD  cyanocobalamin (VITAMIN B12) 1000 MCG tablet Take 1 tablet (1,000 mcg total) by mouth daily. 12/17/22   Carnella Guadalajara, PA-C  diphenhydrAMINE (BENADRYL) 25 MG tablet Take 25 mg by mouth 2 (two) times daily as needed for itching.    [provider]  ergocalciferol (VITAMIN D2) 1.25 MG (50000 UT) capsule Take 1 capsule (50,000 Units total) by mouth every 14 (fourteen) days. 09/01/22   Carnella Guadalajara, PA-C  fluconazole (DIFLUCAN) 150 MG tablet Take 1 tablet (150 mg total) by mouth daily. X one dose 12/20/22   Tat, Onalee Hua, MD  gabapentin (NEURONTIN) 400 MG capsule Take 400 mg by mouth 2 (two) times daily. 01/07/18   [provider]  melatonin 5 MG TABS Take 10 mg by mouth at bedtime as needed (sleep).    [provider]  midodrine (PROAMATINE) 10 MG tablet  Take 10 mg by mouth 3 (three) times daily.    [provider]  Multiple Vitamins-Minerals (MULTIVITAMIN WITH MINERALS) tablet Take 1 tablet by mouth daily.    [provider]  oxymetazoline (AFRIN) 0.05 % nasal spray Place 1 spray into both nostrils daily as needed for congestion.    [provider]  polyethylene glycol (MIRALAX / GLYCOLAX) 17 g packet Take 17 g by mouth 2 (two) times daily. 06/20/22   Enedina Finner, MD  sertraline (ZOLOFT) 50 MG tablet Take 50 mg by mouth daily. 12/12/17   [provider]  spironolactone (ALDACTONE) 25 MG tablet Take 1 tablet (25 mg total) by mouth daily. 10/26/22   Catarina Hartshorn, MD  torsemide 40 MG TABS Take 40 mg by mouth daily. 10/26/22   Catarina Hartshorn, MD    Physical Exam: Vitals:   01/06/23 1710  BP: (!) 133/58  Pulse: 70  Resp: 18  SpO2: 98%   Patient in no distress, was restful however woke up to verbal stimulation and was fully coherent. Respiratory exam: Bilateral air entry vesicular Cardiovascular exam S1 is normal Abdomen all quadrants soft nontender, obese Extremities without edema, obesity noted.  Distal function intact. Data Reviewed:  Labs on Admission:  Results for orders placed or performed during the hospital encounter of 01/06/23 (from the past 24 hour(s))  Type and screen [facility]     Status: None (Preliminary result)   Collection Time: 01/06/23  8:00 PM  Result Value Ref Range   ABO/RH(D) PENDING    Antibody Screen PENDING    Sample Expiration      01/09/2023,2359 Performed at [facility], [address] [place], [place] [address]   Prepare RBC (crossmatch)     Status: None   Collection Time: 01/06/23  8:00 PM  Result Value Ref Range   Order Confirmation      ORDER PROCESSED BY BLOOD BANK Performed at [facility], [address], [place], [place] [address]    Basic Metabolic Panel: Recent Labs  Lab 01/06/23 1016  NA 138  K 4.5  CL 109  CO2  23  GLUCOSE 122*  BUN 43*  CREATININE 1.43*  CALCIUM 8.5*   Liver Function Tests: No results for input(s): "AST", "ALT", "ALKPHOS", "BILITOT", "PROT", "ALBUMIN" in the last 168 hours. No results for input(s): "LIPASE", "AMYLASE" in the last 168 hours. No results for input(s): "AMMONIA" in the last 168 hours. CBC: Recent Labs  Lab 01/06/23 1016 01/06/23  1110  WBC 3.3* 3.4*  NEUTROABS 1.7 1.8  HGB 5.5* 6.8*  HCT 18.2* 22.2*  MCV 79.1* 77.6*  PLT 101* 115*   Cardiac Enzymes: No results for input(s): "CKTOTAL", "CKMB", "CKMBINDEX", "TROPONINIHS" in the last 168 hours.  BNP (last 3 results) No results for input(s): "PROBNP" in the last 8760 hours. CBG: No results for input(s): "GLUCAP" in the last 168 hours.  Radiological Exams on Admission:  IR EMBO TUMOR ORGAN ISCHEMIA INFARCT INC GUIDE ROADMAPPING  Result Date: 01/06/2023 INDICATION: Mrs. Zborowski is a 67 year old female with 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 She underwent "emborrhoid" procedure on 02/07/22 which significantly improved her rectal bleeding initially which gradually returned. She underwent repeat angiogram and embolization of the duplicated bilateral superior rectal arteries on 09/05/22 due to persistent anemia requiring transfusions. Again, she underwent repeat superior rectal embolization on 11/05/22 by my partner, Dr. Archer Asa. Despite this third embolization of the superior rectal arteries, she continues to have rectal bleeding, exacerbated by large volume ascites. She presents today for paracentesis and partial splenic embolization. EXAM: 1. Ultrasound-guided paracentesis. 2. Ultrasound-guided vascular access of the left radial artery. 3. Catheterization and angiography of the celiac, splenic, and inferior polar splenic branches. 4. Particle and coil  embolization of the inferior pole splenic artery branch. MEDICATIONS: None. ANESTHESIA/SEDATION: Moderate (conscious) sedation was employed during this procedure. A total of Versed 2 mg and Fentanyl 100 mcg was administered intravenously. Moderate Sedation Time: 76 minutes. The patient's level of consciousness and vital signs were monitored continuously by radiology nursing throughout the procedure under my direct supervision. CONTRAST:  OMNIPAQUE IOHEXOL 300 MG/ML  SOLN FLUOROSCOPY: Radiation Exposure Index (as provided by the fluoroscopic device): 1,139 mGy Kerma COMPLICATIONS: None immediate. PROCEDURE: Informed consent was obtained from the patient following explanation of the procedure, risks, benefits and alternatives. The patient understands, agrees and consents for the procedure. All questions were addressed. A time out was performed prior to the initiation of the procedure. Maximal barrier sterile technique utilized including caps, mask, sterile gowns, sterile gloves, large sterile drape, hand hygiene, and Betadine prep. Preprocedure ultrasound evaluation of the right upper quadrant demonstrated moderate to large volume ascites. The right upper quadrant was prepped and draped in standard fashion. Subdermal Local anesthesia was provided at the planned needle entry site. A small skin nick was made. Under direct ultrasound visualization, a 10 cm Yueh needle was introduced into the peritoneal cavity. A total of approximately 4 L of translucent, straw-colored fluid was aspirated throughout the procedure. The catheter was then removed and a sterile bandage was applied. The left wrist was prepped and draped in standard fashion. Pulse oximeter was attached to the left thumb. Tora Perches test was performed, grade A. The left radial artery measured 0.20 cm in diameter. Subdermal Local anesthesia was provided at the planned needle entry site with 1% lidocaine. A small skin nick was made. Under direct ultrasound  visualization, the left radial artery was punctured with a 21 gauge micropuncture needle. A permanent image was captured and stored in the record. A microwire was placed and exchanged for a 4/5 French slender sheath. The sheath was flushed followed by installation of standard radial cocktail. Under direct fluoroscopic guidance, a 5 Fr MG1 catheter with indwelling Benson wire was directed in retrograde fashion through the left upper extremity arteries to the level of the aortic arch, through the  descending thoracic aorta, and into the proximal abdominal aorta. The celiac artery was selected. Celiac angiogram demonstrated patency and conventional anatomy. The proximal left splenic artery was then selected. Splenic angiogram was performed which demonstrated patency of the splenic artery with the inferior polar branch supplying approximately 40-50% of the total splenic volume. A Renegade hi Flo microcatheter and fathom 16 microwire were then used to select the inferior polar branch. Inferior polar branch angiogram was performed which demonstrated no evidence of collateralized nontarget arterial branches. Next, under intermittent fluoroscopic guidance, embolization of the inferior polar splenic artery branch was performed with 600 micrometer (3 vials) followed by 800 micrometer (1 vial) Hydropearls. Repeat angiogram demonstrated was near complete stasis. Next, a 6 mm Ruby pot micro coil was then deployed. Completion splenic angiogram demonstrated complete embolization of the inferior polar branch artery. The catheters were removed. A TR band was applied to the left radial access site in the sheath was removed. The patient tolerated the procedure well was transferred to the recovery area in good condition. Proved preprocedure laboratory values demonstrated hemoglobin of 5.5, therefore request was made to the Hospitalist service for admission for transfusion and pain control. IMPRESSION: 1. Technically successful particle  and coil embolization of the inferior polar splenic artery with resulting embolization approximately 40-50% of the total splenic volume. 2. Technically successful ultrasound-guided paracentesis yielding 4 L ascites. PLAN: Follow-up in Interventional Radiology Portal Hypertension Clinic in 1 month. Marliss Coots, MD [facility] Interventional Radiology Special[facility] Electronically Signed   By: Marliss Coots M.D.   On: 01/06/2023 13:48   IR Paracentesis  Result Date: 01/06/2023 INDICATION: Mrs. Magwood is a 67 year old female with 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 She underwent "emborrhoid" procedure on 02/07/22 which significantly improved her rectal bleeding initially which gradually returned. She underwent repeat angiogram and embolization of the duplicated bilateral superior rectal arteries on 09/05/22 due to persistent anemia requiring transfusions. Again, she underwent repeat superior rectal embolization on 11/05/22 by my partner, Dr. Archer Asa. Despite this third embolization of the superior rectal arteries, she continues to have rectal bleeding, exacerbated by large volume ascites. She presents today for paracentesis and partial splenic embolization. EXAM: 1. Ultrasound-guided paracentesis. 2. Ultrasound-guided vascular access of the left radial artery. 3. Catheterization and angiography of the celiac, splenic, and inferior polar splenic branches. 4. Particle and coil embolization of the inferior pole splenic artery branch. MEDICATIONS: None. ANESTHESIA/SEDATION: Moderate (conscious) sedation was employed during this procedure. A total of Versed 2 mg and Fentanyl 100 mcg was administered intravenously. Moderate Sedation Time: 76 minutes. The patient's level of consciousness and vital signs were monitored continuously by  radiology nursing throughout the procedure under my direct supervision. CONTRAST:  OMNIPAQUE IOHEXOL 300 MG/ML  SOLN FLUOROSCOPY: Radiation Exposure Index (as provided by the fluoroscopic device): 1,139 mGy Kerma COMPLICATIONS: None immediate. PROCEDURE: Informed consent was obtained from the patient following explanation of the procedure, risks, benefits and alternatives. The patient understands, agrees and consents for the procedure. All questions were addressed. A time out was performed prior to the initiation of the procedure. Maximal barrier sterile technique utilized including caps, mask, sterile gowns, sterile gloves, large sterile drape, hand hygiene, and Betadine prep. Preprocedure ultrasound evaluation of the right upper quadrant demonstrated moderate to large volume ascites. The right upper quadrant was prepped and draped in standard fashion. Subdermal Local anesthesia  was provided at the planned needle entry site. A small skin nick was made. Under direct ultrasound visualization, a 10 cm Yueh needle was introduced into the peritoneal cavity. A total of approximately 4 L of translucent, straw-colored fluid was aspirated throughout the procedure. The catheter was then removed and a sterile bandage was applied. The left wrist was prepped and draped in standard fashion. Pulse oximeter was attached to the left thumb. Tora Perches test was performed, grade A. The left radial artery measured 0.20 cm in diameter. Subdermal Local anesthesia was provided at the planned needle entry site with 1% lidocaine. A small skin nick was made. Under direct ultrasound visualization, the left radial artery was punctured with a 21 gauge micropuncture needle. A permanent image was captured and stored in the record. A microwire was placed and exchanged for a 4/5 French slender sheath. The sheath was flushed followed by installation of standard radial cocktail. Under direct fluoroscopic guidance, a 5 Fr MG1 catheter with  indwelling Benson wire was directed in retrograde fashion through the left upper extremity arteries to the level of the aortic arch, through the descending thoracic aorta, and into the proximal abdominal aorta. The celiac artery was selected. Celiac angiogram demonstrated patency and conventional anatomy. The proximal left splenic artery was then selected. Splenic angiogram was performed which demonstrated patency of the splenic artery with the inferior polar branch supplying approximately 40-50% of the total splenic volume. A Renegade hi Flo microcatheter and fathom 16 microwire were then used to select the inferior polar branch. Inferior polar branch angiogram was performed which demonstrated no evidence of collateralized nontarget arterial branches. Next, under intermittent fluoroscopic guidance, embolization of the inferior polar splenic artery branch was performed with 600 micrometer (3 vials) followed by 800 micrometer (1 vial) Hydropearls. Repeat angiogram demonstrated was near complete stasis. Next, a 6 mm Ruby pot micro coil was then deployed. Completion splenic angiogram demonstrated complete embolization of the inferior polar branch artery. The catheters were removed. A TR band was applied to the left radial access site in the sheath was removed. The patient tolerated the procedure well was transferred to the recovery area in good condition. Proved preprocedure laboratory values demonstrated hemoglobin of 5.5, therefore request was made to the Hospitalist service for admission for transfusion and pain control. IMPRESSION: 1. Technically successful particle and coil embolization of the inferior polar splenic artery with resulting embolization approximately 40-50% of the total splenic volume. 2. Technically successful ultrasound-guided paracentesis yielding 4 L ascites. PLAN: Follow-up in Interventional Radiology Portal Hypertension Clinic in 1 month. Marliss Coots, MD Vascular and Interventional Radiology  Specialists [facility] Radiology Electronically Signed   By: Marliss Coots M.D.   On: 01/06/2023 13:48       Assessment and Plan: * Anemia Has had chronic anemia actually pancytopenia and has been following up oncology.  There are multiple factors for anemia including bleeding from internal hemorrhoids as well as splenic sequestration.  I will do a limited workup for anemia such as reticulocyte count ferritin iron panel B12 folate haptoglobin and electrophoresis of serum to make sure were not dealing with multiple myeloma.  Patient consented to blood transfusion, we will start with a single unit transfusion.  Monitor CBC response at midnight and proceed accordingly.  Pain Pain due to splenic artery embolization.  Seems to be mild to moderate at this time.  Patient requesting Tylenol we will start there.  Patient still seems to be slightly sedated from her sedation, therefore will defer starting opiates at this  time.  However no objection once the patient wakes up to give her opiates as well if needed.  Splenic sequestration Chest post partial splenic embolization today.  We will monitor clinically and see how the patient's CBC profile response.  Internal hemorrhoid Apparently these have been ? bleeding requiring multiple transfusions for patient.  Monitoring clinically.      Advance Care Planning:   Code Status: Prior full code  Consults: none at this time.  Family Communication: per patietn.  Severity of Illness: The appropriate patient status for this patient is OBSERVATION. Observation status is judged to be reasonable and necessary in order to provide the required intensity of service to ensure the patient's safety. The patient's presenting symptoms, physical exam findings, and initial radiographic and laboratory data in the context of their medical condition is felt to place them at decreased risk for further clinical deterioration. Furthermore, it is anticipated that the patient  will be medically stable for discharge from the hospital within 2 midnights of admission.   Author: Nolberto Hanlon, MD 01/06/2023 7:18 PM  For on call review [email or web address].

## 2023-01-06 NOTE — Telephone Encounter (Signed)
Spoke with husband to let him know we canceled her appointments for this week . Patient will follow up as scheduled.

## 2023-01-06 NOTE — Progress Notes (Signed)
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 Elby Showers of IR, and was found have worsening anemia.  We are asked to admit patient for observation and blood transfusion.  Pt underwent successful paracentesis with 4L of fluid removed and Left radial artery approach to inferior pole of spleen embolization with particles and coil today. Patient has nausea, and mild discomfort in the right middle quadrant, no vomiting or diarrhea.  Denies fever or chills.  No chest pain, cough, shortness of breath.  Denies symptoms of UTI.  Denies active rectal bleeding.  Patient was found to have worsening anemia with hemoglobin 5.5, repeated CBC showed hemoglobin 6.8.  Her baseline hemoglobin is 7-8. We are asked to admit patient for observation and blood transfusion.  Per IR PA, Brayton Caves, "The patient is known to IR from paracentesis procedures, "emborrhoid" procedure performed 02/07/22, and angiograms with rectal artery embolizations on 09/05/22 and 11/05/22. Patient additionally has had a port placed on 11/25/22 to facilitate her near-weekly blood transfusions to treat her anemia. Patient has been seen by Dr Elby Showers on 12/12/22 to discuss the possibility of partial splenic artery embolization to "improve her hematologic profiles as well as decrease portal hypertension."      Lorretta Harp, MD  [facility]   If 7PM-7AM, please contact night-coverage [email or web address] 01/06/2023, 1:33 PM

## 2023-01-06 NOTE — Assessment & Plan Note (Signed)
Chest post partial splenic embolization today.  We will monitor clinically and see how the patient's CBC profile response.

## 2023-01-07 ENCOUNTER — Inpatient Hospital Stay: Payer: Medicare Other

## 2023-01-07 DIAGNOSIS — N189 Chronic kidney disease, unspecified: Secondary | ICD-10-CM | POA: Diagnosis not present

## 2023-01-07 DIAGNOSIS — D61818 Other pancytopenia: Secondary | ICD-10-CM | POA: Diagnosis not present

## 2023-01-07 DIAGNOSIS — F32A Depression, unspecified: Secondary | ICD-10-CM | POA: Diagnosis present

## 2023-01-07 DIAGNOSIS — Z8616 Personal history of COVID-19: Secondary | ICD-10-CM | POA: Diagnosis not present

## 2023-01-07 DIAGNOSIS — I959 Hypotension, unspecified: Secondary | ICD-10-CM

## 2023-01-07 DIAGNOSIS — J90 Pleural effusion, not elsewhere classified: Secondary | ICD-10-CM | POA: Diagnosis not present

## 2023-01-07 DIAGNOSIS — N184 Chronic kidney disease, stage 4 (severe): Secondary | ICD-10-CM | POA: Diagnosis present

## 2023-01-07 DIAGNOSIS — K7581 Nonalcoholic steatohepatitis (NASH): Secondary | ICD-10-CM

## 2023-01-07 DIAGNOSIS — K7682 Hepatic encephalopathy: Secondary | ICD-10-CM | POA: Diagnosis not present

## 2023-01-07 DIAGNOSIS — D696 Thrombocytopenia, unspecified: Secondary | ICD-10-CM

## 2023-01-07 DIAGNOSIS — R161 Splenomegaly, not elsewhere classified: Secondary | ICD-10-CM | POA: Diagnosis not present

## 2023-01-07 DIAGNOSIS — Z96611 Presence of right artificial shoulder joint: Secondary | ICD-10-CM | POA: Diagnosis present

## 2023-01-07 DIAGNOSIS — E1140 Type 2 diabetes mellitus with diabetic neuropathy, unspecified: Secondary | ICD-10-CM | POA: Diagnosis present

## 2023-01-07 DIAGNOSIS — D7389 Other diseases of spleen: Secondary | ICD-10-CM | POA: Diagnosis not present

## 2023-01-07 DIAGNOSIS — D649 Anemia, unspecified: Secondary | ICD-10-CM | POA: Diagnosis not present

## 2023-01-07 DIAGNOSIS — N1832 Chronic kidney disease, stage 3b: Secondary | ICD-10-CM

## 2023-01-07 DIAGNOSIS — I748 Embolism and thrombosis of other arteries: Secondary | ICD-10-CM | POA: Diagnosis not present

## 2023-01-07 DIAGNOSIS — Z8249 Family history of ischemic heart disease and other diseases of the circulatory system: Secondary | ICD-10-CM | POA: Diagnosis not present

## 2023-01-07 DIAGNOSIS — K7460 Unspecified cirrhosis of liver: Secondary | ICD-10-CM

## 2023-01-07 DIAGNOSIS — K766 Portal hypertension: Secondary | ICD-10-CM | POA: Diagnosis not present

## 2023-01-07 DIAGNOSIS — D563 Thalassemia minor: Secondary | ICD-10-CM | POA: Diagnosis not present

## 2023-01-07 DIAGNOSIS — R188 Other ascites: Secondary | ICD-10-CM | POA: Diagnosis not present

## 2023-01-07 DIAGNOSIS — D631 Anemia in chronic kidney disease: Secondary | ICD-10-CM | POA: Diagnosis not present

## 2023-01-07 DIAGNOSIS — N179 Acute kidney failure, unspecified: Secondary | ICD-10-CM | POA: Diagnosis not present

## 2023-01-07 DIAGNOSIS — Z79899 Other long term (current) drug therapy: Secondary | ICD-10-CM | POA: Diagnosis not present

## 2023-01-07 DIAGNOSIS — R5381 Other malaise: Secondary | ICD-10-CM | POA: Diagnosis not present

## 2023-01-07 DIAGNOSIS — J811 Chronic pulmonary edema: Secondary | ICD-10-CM | POA: Diagnosis not present

## 2023-01-07 DIAGNOSIS — D735 Infarction of spleen: Secondary | ICD-10-CM | POA: Diagnosis not present

## 2023-01-07 DIAGNOSIS — D500 Iron deficiency anemia secondary to blood loss (chronic): Secondary | ICD-10-CM | POA: Diagnosis not present

## 2023-01-07 DIAGNOSIS — F419 Anxiety disorder, unspecified: Secondary | ICD-10-CM | POA: Diagnosis present

## 2023-01-07 DIAGNOSIS — K920 Hematemesis: Secondary | ICD-10-CM | POA: Diagnosis not present

## 2023-01-07 DIAGNOSIS — R509 Fever, unspecified: Secondary | ICD-10-CM | POA: Diagnosis not present

## 2023-01-07 DIAGNOSIS — Z833 Family history of diabetes mellitus: Secondary | ICD-10-CM | POA: Diagnosis not present

## 2023-01-07 DIAGNOSIS — I129 Hypertensive chronic kidney disease with stage 1 through stage 4 chronic kidney disease, or unspecified chronic kidney disease: Secondary | ICD-10-CM | POA: Diagnosis present

## 2023-01-07 DIAGNOSIS — E1122 Type 2 diabetes mellitus with diabetic chronic kidney disease: Secondary | ICD-10-CM | POA: Diagnosis present

## 2023-01-07 DIAGNOSIS — K8020 Calculus of gallbladder without cholecystitis without obstruction: Secondary | ICD-10-CM | POA: Diagnosis not present

## 2023-01-07 DIAGNOSIS — E785 Hyperlipidemia, unspecified: Secondary | ICD-10-CM | POA: Diagnosis present

## 2023-01-07 DIAGNOSIS — L97522 Non-pressure chronic ulcer of other part of left foot with fat layer exposed: Secondary | ICD-10-CM | POA: Diagnosis not present

## 2023-01-07 DIAGNOSIS — K648 Other hemorrhoids: Secondary | ICD-10-CM | POA: Diagnosis not present

## 2023-01-07 LAB — AMMONIA: Ammonia: 69 umol/L — ABNORMAL HIGH (ref 9–35)

## 2023-01-07 LAB — CBC
HCT: 26.3 % — ABNORMAL LOW (ref 36.0–46.0)
Hemoglobin: 8.3 g/dL — ABNORMAL LOW (ref 12.0–15.0)
MCH: 24.4 pg — ABNORMAL LOW (ref 26.0–34.0)
MCHC: 31.6 g/dL (ref 30.0–36.0)
MCV: 77.4 fL — ABNORMAL LOW (ref 80.0–100.0)
Platelets: 141 10*3/uL — ABNORMAL LOW (ref 150–400)
RBC: 3.4 MIL/uL — ABNORMAL LOW (ref 3.87–5.11)
RDW: 19 % — ABNORMAL HIGH (ref 11.5–15.5)
WBC: 6.6 10*3/uL (ref 4.0–10.5)
nRBC: 0.3 % — ABNORMAL HIGH (ref 0.0–0.2)

## 2023-01-07 LAB — TYPE AND SCREEN
ABO/RH(D): A POS
Antibody Screen: NEGATIVE

## 2023-01-07 LAB — IRON AND TIBC
Iron: 101 ug/dL (ref 28–170)
Saturation Ratios: 62 % — ABNORMAL HIGH (ref 10.4–31.8)
TIBC: 164 ug/dL — ABNORMAL LOW (ref 250–450)
UIBC: 63 ug/dL

## 2023-01-07 LAB — BPAM RBC
Blood Product Expiration Date: 202406142359
ISSUE DATE / TIME: 202405212324

## 2023-01-07 LAB — BASIC METABOLIC PANEL
Anion gap: 6 (ref 5–15)
BUN: 38 mg/dL — ABNORMAL HIGH (ref 8–23)
CO2: 23 mmol/L (ref 22–32)
Calcium: 8.4 mg/dL — ABNORMAL LOW (ref 8.9–10.3)
Chloride: 109 mmol/L (ref 98–111)
Creatinine, Ser: 1.35 mg/dL — ABNORMAL HIGH (ref 0.44–1.00)
GFR, Estimated: 44 mL/min — ABNORMAL LOW (ref >60)
Glucose, Bld: 145 mg/dL — ABNORMAL HIGH (ref 70–99)
Potassium: 4.9 mmol/L (ref 3.5–5.1)
Sodium: 138 mmol/L (ref 135–145)

## 2023-01-07 LAB — FOLATE: Folate: 15.9 ng/mL (ref >5.9)

## 2023-01-07 LAB — VITAMIN B12: Vitamin B-12: 678 pg/mL (ref 180–914)

## 2023-01-07 LAB — FERRITIN: Ferritin: 4491 ng/mL — ABNORMAL HIGH (ref 11–307)

## 2023-01-07 MED ORDER — VITAMIN B-12 1000 MCG PO TABS
1000.0000 ug | ORAL_TABLET | Freq: Every day | ORAL | Status: DC
  Administered 2023-01-07 – 2023-01-19 (×13): 1000 ug via ORAL
  Filled 2023-01-07 (×13): qty 1

## 2023-01-07 MED ORDER — BACITRACIN ZINC 500 UNIT/GM EX OINT
TOPICAL_OINTMENT | Freq: Every day | CUTANEOUS | Status: AC
  Administered 2023-01-07 – 2023-01-13 (×3): 1 via TOPICAL
  Filled 2023-01-07 (×7): qty 0.9

## 2023-01-07 MED ORDER — TORSEMIDE 20 MG PO TABS
40.0000 mg | ORAL_TABLET | Freq: Every day | ORAL | Status: DC
  Administered 2023-01-08 – 2023-01-19 (×12): 40 mg via ORAL
  Filled 2023-01-07 (×12): qty 2

## 2023-01-07 MED ORDER — LACTULOSE 10 GM/15ML PO SOLN
20.0000 g | Freq: Three times a day (TID) | ORAL | Status: DC
  Administered 2023-01-07 – 2023-01-11 (×11): 20 g via ORAL
  Filled 2023-01-07 (×25): qty 30

## 2023-01-07 MED ORDER — CHLORHEXIDINE GLUCONATE CLOTH 2 % EX PADS
6.0000 | MEDICATED_PAD | Freq: Every day | CUTANEOUS | Status: DC
  Administered 2023-01-07 – 2023-01-19 (×12): 6 via TOPICAL

## 2023-01-07 MED ORDER — LACTULOSE ENEMA
300.0000 mL | Freq: Two times a day (BID) | ORAL | Status: DC
  Administered 2023-01-07 – 2023-01-10 (×3): 300 mL via RECTAL
  Filled 2023-01-07 (×8): qty 300

## 2023-01-07 MED ORDER — SPIRONOLACTONE 25 MG PO TABS
50.0000 mg | ORAL_TABLET | Freq: Every day | ORAL | Status: DC
  Administered 2023-01-07 – 2023-01-19 (×13): 50 mg via ORAL
  Filled 2023-01-07 (×13): qty 2

## 2023-01-07 MED ORDER — GABAPENTIN 300 MG PO CAPS
400.0000 mg | ORAL_CAPSULE | Freq: Two times a day (BID) | ORAL | Status: DC
  Administered 2023-01-07 – 2023-01-19 (×24): 400 mg via ORAL
  Filled 2023-01-07 (×24): qty 1

## 2023-01-07 MED ORDER — SERTRALINE HCL 50 MG PO TABS
50.0000 mg | ORAL_TABLET | Freq: Every day | ORAL | Status: DC
  Administered 2023-01-08 – 2023-01-19 (×12): 50 mg via ORAL
  Filled 2023-01-07 (×12): qty 1

## 2023-01-07 MED ORDER — ALBUTEROL SULFATE (2.5 MG/3ML) 0.083% IN NEBU: 3.0000 mL | INHALATION_SOLUTION | Freq: Four times a day (QID) | RESPIRATORY_TRACT | Status: DC | PRN

## 2023-01-07 MED ORDER — ATORVASTATIN CALCIUM 20 MG PO TABS
10.0000 mg | ORAL_TABLET | Freq: Every day | ORAL | Status: DC
  Administered 2023-01-07 – 2023-01-19 (×13): 10 mg via ORAL
  Filled 2023-01-07 (×13): qty 1

## 2023-01-07 NOTE — TOC Initial Note (Signed)
Transition of Care [facility]) - Initial/Assessment Note    Patient Details  Name: Rachel Huber MRN: [id number] Date of Birth: 1955/10/14  [facility] CM/SW Contact:    Allena Katz, LCSW Phone Number: 01/07/2023, 2:34 PM  Clinical Narrative:     CSW spoke with patient regarding need for [facility] at discharge for lower foot wound. Pt is agreeable. Reports no preference as she states she has not had HH in past. Pt is active with dr Dareen Piano for primary care and lives with her husband. Pt reports no concerns with transportation as she has her husband drive her to appts. Pt agreeable to adding PT/OT if therapy determines it is needed at discharge. TOC continuing to follow. Barbara Cower with [facility] RN.               Expected Discharge Plan: Home w Home Health Services     Patient Goals and CMS Choice Patient states their goals for this hospitalization and ongoing recovery are:: return home CMS Medicare.gov Compare Post Acute Care list provided to:: Patient        Expected Discharge Plan and Services                                   HH Arranged: RN          Prior Living Arrangements/Services                       Activities of Daily Living Home Assistive Devices/Equipment: Dan Humphreys (specify type) ADL Screening (condition at time of admission) Patient's cognitive ability adequate to safely complete daily activities?: Yes Is the patient deaf or have difficulty hearing?: No Does the patient have difficulty seeing, even when wearing glasses/contacts?: No Does the patient have difficulty concentrating, remembering, or making decisions?: No Patient able to express need for assistance with ADLs?: No Does the patient have difficulty dressing or bathing?: No Independently performs ADLs?: Yes (appropriate for developmental age) Does the patient have difficulty walking or climbing stairs?: Yes Weakness of Legs: Both Weakness of Arms/Hands:  None  Permission Sought/Granted                  Emotional Assessment Appearance:: Well-Groomed     Orientation: : Oriented to Self, Oriented to Place, Oriented to  Time, Oriented to Situation Alcohol / Substance Use: Never Used    Admission diagnosis:  Pancytopenia (HCC) [D61.818] Anemia [D64.9] Patient Active Problem List   Diagnosis Date Noted   Pancytopenia (HCC) 01/06/2023   Anemia 01/06/2023   Internal hemorrhoid 01/06/2023   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   Symptomatic anemia 12/17/2022   Hypokalemia 11/06/2022   Acute on chronic anemia 11/03/2022   Hypotension 11/03/2022   AKI (acute kidney injury) (HCC) 10/15/2022   Rectal bleeding 06/17/2022   Obesity (BMI 30-39.9) 06/17/2022   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 mellitus (HCC) 06/17/2022   Grade IV internal hemorrhoids 05/16/2022   Thrombocytopenia (HCC) 03/05/2022   Anemia in chronic kidney disease (CKD) 02/07/2022   GI bleeding 02/04/2022   Chronic kidney disease, stage 3b (HCC) 02/04/2022   Depression with anxiety 02/04/2022   Liver cirrhosis secondary to NASH (HCC)    Insomnia 01/27/2022   Iron deficiency anemia due to chronic blood loss 02/13/2021   History of uterine cancer 02/13/2021   Decompensated hepatic cirrhosis (HCC) 10/25/2020   OSA on CPAP 10/25/2020   Acute  respiratory failure with hypoxia (HCC) 09/10/2020   Closed right hip fracture (HCC) 09/02/2020   Type 2 diabetes mellitus with hyperlipidemia (HCC) 09/02/2020   Thalassemia minor    HLD (hyperlipidemia)    Acute kidney injury superimposed on CKD [facility])    Depression    PCP:  Lauro Regulus, MD Pharmacy:   [facility] [address] [place] - [address] [place] [address] Phone: [phone] Fax: [phone]     Social Determinants of Health (SDOH) Social History: SDOH Screenings   Food Insecurity: Patient Declined (01/06/2023)  Housing: Patient Declined (01/06/2023)  Transportation  Needs: Patient Declined (01/06/2023)  Utilities: Patient Declined (01/06/2023)  Alcohol Screen: Low Risk  (04/04/2022)  Depression (PHQ2-9): Low Risk  (04/04/2022)  Financial Resource Strain: Low Risk  (04/04/2022)  Physical Activity: Inactive (04/04/2022)  Social Connections: Moderately Isolated (04/04/2022)  Stress: No Stress Concern Present (04/04/2022)  Tobacco Use: Low Risk  (01/06/2023)   SDOH Interventions:     Readmission Risk Interventions    12/19/2022   10:27 AM 11/04/2022   11:32 AM 10/17/2022    9:17 AM  Readmission Risk Prevention Plan  Transportation Screening Complete Complete Complete  Medication Review [employer]) Complete Complete Complete  PCP or Specialist appointment within 3-5 days of discharge Complete Complete   HRI or Home Care Consult Complete  Complete  SW Recovery Care/Counseling Consult Complete Complete Complete  Palliative Care Screening Not Applicable Not Applicable Not Applicable  Skilled Nursing Facility Not Applicable Not Applicable Not Applicable

## 2023-01-07 NOTE — Consult Note (Signed)
WOC Nurse Consult Note: Reason for Consult:Left foot ulceration, left hallux, distal tip, seen and followed by Dr. Excell Seltzer (Podiatric Medicine) in the community. Last seen in Dr. Bernette Redbird office on 01/05/23. We will continue the POC in place by Dr. Excell Seltzer. Wound type:Neuropathic, diabetic Pressure Injury POA: N/A Measurement:0.3cm round x 0.1cm per Nursing flow sheet Wound ZOX:WRUE, pale Drainage (amount, consistency, odor) scant serosanguinous Periwound:intact Dressing procedure/placement/frequency: Daily wound care is for cleanse with NS,k dry and apply bacitracin ointment, top with dry gauze and secure with Coban self adhering wrap and apply a buttress. In house, we will secure with Kerlix roll gauze/paper tape. Coban and buttre4ss can resume upon discharge with husband assisting with home wound care. He has been instructed in this per Dr. Bernette Redbird last note.  If further direction or evaluation is desired, recommend consulting Dr. Excell Seltzer.  WOC nursing team will not follow, but will remain available to this patient, the nursing and medical teams.  Please re-consult if needed.  Thank you for inviting [place] to participate in this patient's Plan of Care.  Ladona Mow, MSN, RN, CNS, GNP, Leda Min, [employer], [employer] phone:  [phone]

## 2023-01-07 NOTE — Progress Notes (Signed)
Unable to complete admission during night shift due to patient drowsiness and fatigue.

## 2023-01-07 NOTE — Progress Notes (Signed)
Patient unable to tell home meds. Still very restful and dosing off. May now be due ot the hour of the evening. However, will check ammonia level. Home med collection still pending.

## 2023-01-07 NOTE — Assessment & Plan Note (Signed)
Patient has chronic pancytopenia.  Multifactorial with chronic disease, liver cirrhosis and splenic sequestration.  Some improvement today.

## 2023-01-07 NOTE — Progress Notes (Signed)
Referring Physician(s): Locklear,Cameron T  Supervising Physician: Roanna Banning  Patient Status:  [facility] - In-pt  Chief Complaint: Partial splenic embolization  Subjective: Patient awakens easily, but does not answer questions.  Quickly falls back to sleep.  Hgb and platelets improved this AM.  Patient able to take medications, but is refusing lactulose.  For enema.   Allergies: Gramineae pollens and Latex  Medications: Prior to Admission medications   Medication Sig Start Date End Date Taking? Authorizing Provider  acetaminophen (TYLENOL) 650 MG CR tablet Take 1,300 mg by mouth daily as needed for pain.    [provider]  albuterol (VENTOLIN HFA) 108 (90 Base) MCG/ACT inhaler Inhale 2 puffs into the lungs every 6 (six) hours as needed for wheezing or shortness of breath. 02/10/22   Alford Highland, MD  atorvastatin (LIPITOR) 10 MG tablet Take 1 tablet (10 mg total) by mouth daily. 06/20/22   Enedina Finner, MD  cyanocobalamin (VITAMIN B12) 1000 MCG tablet Take 1 tablet (1,000 mcg total) by mouth daily. 12/17/22   Carnella Guadalajara, PA-C  diphenhydrAMINE (BENADRYL) 25 MG tablet Take 25 mg by mouth 2 (two) times daily as needed for itching.    [provider]  ergocalciferol (VITAMIN D2) 1.25 MG (50000 UT) capsule Take 1 capsule (50,000 Units total) by mouth every 14 (fourteen) days. 09/01/22   Carnella Guadalajara, PA-C  fluconazole (DIFLUCAN) 150 MG tablet Take 1 tablet (150 mg total) by mouth daily. X one dose 12/20/22   Tat, Onalee Hua, MD  gabapentin (NEURONTIN) 400 MG capsule Take 400 mg by mouth 2 (two) times daily. 01/07/18   [provider]  melatonin 5 MG TABS Take 10 mg by mouth at bedtime as needed (sleep).    [provider]  midodrine (PROAMATINE) 10 MG tablet Take 10 mg by mouth 3 (three) times daily.    [provider]  Multiple Vitamins-Minerals (MULTIVITAMIN WITH MINERALS) tablet Take 1 tablet by mouth daily.    [provider]  oxymetazoline (AFRIN) 0.05 % nasal spray Place 1 spray into both nostrils daily as needed for congestion.    [provider]  polyethylene glycol (MIRALAX / GLYCOLAX) 17 g packet Take 17 g by mouth 2 (two) times daily. 06/20/22   Enedina Finner, MD  sertraline (ZOLOFT) 50 MG tablet Take 50 mg by mouth daily. 12/12/17   [provider]  spironolactone (ALDACTONE) 25 MG tablet Take 1 tablet (25 mg total) by mouth daily. 10/26/22   Catarina Hartshorn, MD  torsemide 40 MG TABS Take 40 mg by mouth daily. 10/26/22   Catarina Hartshorn, MD     Vital Signs: BP (!) 135/53 (BP Location: Right Arm)   Pulse 81   Temp 100 F (37.8 C)   Resp 18   Ht 5\' 9"  (1.753 m)   Wt 259 lb (117.5 kg)   SpO2 96%   BMI 38.25 kg/m   Physical Exam NAD, arouses but does not sustain attention or participate.  Skin: L radial procedure site intact.  TR band has been removed without issue.  Pulses 2+  Imaging: IR EMBO TUMOR ORGAN ISCHEMIA INFARCT INC GUIDE ROADMAPPING  Result Date: 01/06/2023 INDICATION: Mrs. 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 She underwent "emborrhoid" procedure on 02/07/22 which significantly improved her rectal bleeding initially which gradually returned. She  underwent repeat angiogram and embolization of the duplicated bilateral superior rectal arteries on 09/05/22 due to persistent anemia requiring transfusions. Again, she underwent repeat superior rectal embolization on 11/05/22 by my partner, Dr. Archer Asa. Despite this third embolization of the superior rectal arteries, she continues to have rectal bleeding, exacerbated by large volume ascites. She presents today for paracentesis and partial splenic embolization. EXAM: 1. Ultrasound-guided paracentesis. 2. Ultrasound-guided vascular  access of the left radial artery. 3. Catheterization and angiography of the celiac, splenic, and inferior polar splenic branches. 4. Particle and coil embolization of the inferior pole splenic artery branch. MEDICATIONS: None. ANESTHESIA/SEDATION: Moderate (conscious) sedation was employed during this procedure. A total of Versed 2 mg and Fentanyl 100 mcg was administered intravenously. Moderate Sedation Time: 76 minutes. The patient's level of consciousness and vital signs were monitored continuously by radiology nursing throughout the procedure under my direct supervision. CONTRAST:  OMNIPAQUE IOHEXOL 300 MG/ML  SOLN FLUOROSCOPY: Radiation Exposure Index (as provided by the fluoroscopic device): 1,139 mGy Kerma COMPLICATIONS: None immediate. PROCEDURE: Informed consent was obtained from the patient following explanation of the procedure, risks, benefits and alternatives. The patient understands, agrees and consents for the procedure. All questions were addressed. A time out was performed prior to the initiation of the procedure. Maximal barrier sterile technique utilized including caps, mask, sterile gowns, sterile gloves, large sterile drape, hand hygiene, and Betadine prep. Preprocedure ultrasound evaluation of the right upper quadrant demonstrated moderate to large volume ascites. The right upper quadrant was prepped and draped in standard fashion. Subdermal Local anesthesia was provided at the planned needle entry site. A small skin nick was made. Under direct ultrasound visualization, a 10 cm Yueh needle was introduced into the peritoneal cavity. A total of approximately 4 L of translucent, straw-colored fluid was aspirated throughout the procedure. The catheter was then removed and a sterile bandage was applied. The left wrist was prepped and draped in standard fashion. Pulse oximeter was attached to the left thumb. Tora Perches test was performed, grade A. The left radial artery measured 0.20 cm in  diameter. Subdermal Local anesthesia was provided at the planned needle entry site with 1% lidocaine. A small skin nick was made. Under direct ultrasound visualization, the left radial artery was punctured with a 21 gauge micropuncture needle. A permanent image was captured and stored in the record. A microwire was placed and exchanged for a 4/5 French slender sheath. The sheath was flushed followed by installation of standard radial cocktail. Under direct fluoroscopic guidance, a 5 Fr MG1 catheter with indwelling Benson wire was directed in retrograde fashion through the left upper extremity arteries to the level of the aortic arch, through the descending thoracic aorta, and into the proximal abdominal aorta. The celiac artery was selected. Celiac angiogram demonstrated patency and conventional anatomy. The proximal left splenic artery was then selected. Splenic angiogram was performed which demonstrated patency of the splenic artery with the inferior polar branch supplying approximately 40-50% of the total splenic volume. A Renegade hi Flo microcatheter and fathom 16 microwire were then used to select the inferior polar branch. Inferior polar branch angiogram was performed which demonstrated no evidence of collateralized nontarget arterial branches. Next, under intermittent fluoroscopic guidance, embolization of the inferior polar splenic artery branch was performed with 600 micrometer (3 vials) followed by 800 micrometer (1 vial) Hydropearls. Repeat angiogram demonstrated was near complete stasis. Next, a 6 mm Ruby pot micro coil was then deployed. Completion splenic angiogram demonstrated complete embolization of the inferior polar branch  artery. The catheters were removed. A TR band was applied to the left radial access site in the sheath was removed. The patient tolerated the procedure well was transferred to the recovery area in good condition. Proved preprocedure laboratory values demonstrated hemoglobin of  5.5, therefore request was made to the Hospitalist service for admission for transfusion and pain control. IMPRESSION: 1. Technically successful particle and coil embolization of the inferior polar splenic artery with resulting embolization approximately 40-50% of the total splenic volume. 2. Technically successful ultrasound-guided paracentesis yielding 4 L ascites. PLAN: Follow-up in Interventional Radiology [facility] in 1 month. Marliss Coots, MD [facility] Radiology Electronically Signed   By: Marliss Coots M.D.   On: 01/06/2023 13:48   IR Paracentesis  Result Date: 01/06/2023 INDICATION: Mrs. 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 She underwent "emborrhoid" procedure on 02/07/22 which significantly improved her rectal bleeding initially which gradually returned. She underwent repeat angiogram and embolization of the duplicated bilateral superior rectal arteries on 09/05/22 due to persistent anemia requiring transfusions. Again, she underwent repeat superior rectal embolization on 11/05/22 by my partner, Dr. Archer Asa. Despite this third embolization of the superior rectal arteries, she continues to have rectal bleeding, exacerbated by large volume ascites. She presents today for paracentesis and partial splenic embolization. EXAM: 1. Ultrasound-guided paracentesis. 2. Ultrasound-guided vascular access of the left radial artery. 3. Catheterization and angiography of the celiac, splenic, and inferior polar splenic branches. 4. Particle and coil embolization of the inferior pole splenic artery branch. MEDICATIONS: None. ANESTHESIA/SEDATION: Moderate (conscious) sedation was employed during this procedure. A total of Versed 2 mg and Fentanyl 100 mcg  was administered intravenously. Moderate Sedation Time: 76 minutes. The patient's level of consciousness and vital signs were monitored continuously by radiology nursing throughout the procedure under my direct supervision. CONTRAST:  OMNIPAQUE IOHEXOL 300 MG/ML  SOLN FLUOROSCOPY: Radiation Exposure Index (as provided by the fluoroscopic device): 1,139 mGy Kerma COMPLICATIONS: None immediate. PROCEDURE: Informed consent was obtained from the patient following explanation of the procedure, risks, benefits and alternatives. The patient understands, agrees and consents for the procedure. All questions were addressed. A time out was performed prior to the initiation of the procedure. Maximal barrier sterile technique utilized including caps, mask, sterile gowns, sterile gloves, large sterile drape, hand hygiene, and Betadine prep. Preprocedure ultrasound evaluation of the right upper quadrant demonstrated moderate to large volume ascites. The right upper quadrant was prepped and draped in standard fashion. Subdermal Local anesthesia was provided at the planned needle entry site. A small skin nick was made. Under direct ultrasound visualization, a 10 cm Yueh needle was introduced into the peritoneal cavity. A total of approximately 4 L of translucent, straw-colored fluid was aspirated throughout the procedure. The catheter was then removed and a sterile bandage was applied. The left wrist was prepped and draped in standard fashion. Pulse oximeter was attached to the left thumb. Tora Perches test was performed, grade A. The left radial artery measured 0.20 cm in diameter. Subdermal Local anesthesia was provided at the planned needle entry site with 1% lidocaine. A small skin nick was made. Under direct ultrasound visualization, the left radial artery was punctured with a 21 gauge micropuncture needle. A permanent image was captured and stored in the record. A microwire was placed and exchanged for a  4/5 French slender  sheath. The sheath was flushed followed by installation of standard radial cocktail. Under direct fluoroscopic guidance, a 5 Fr MG1 catheter with indwelling Benson wire was directed in retrograde fashion through the left upper extremity arteries to the level of the aortic arch, through the descending thoracic aorta, and into the proximal abdominal aorta. The celiac artery was selected. Celiac angiogram demonstrated patency and conventional anatomy. The proximal left splenic artery was then selected. Splenic angiogram was performed which demonstrated patency of the splenic artery with the inferior polar branch supplying approximately 40-50% of the total splenic volume. A Renegade hi Flo microcatheter and fathom 16 microwire were then used to select the inferior polar branch. Inferior polar branch angiogram was performed which demonstrated no evidence of collateralized nontarget arterial branches. Next, under intermittent fluoroscopic guidance, embolization of the inferior polar splenic artery branch was performed with 600 micrometer (3 vials) followed by 800 micrometer (1 vial) Hydropearls. Repeat angiogram demonstrated was near complete stasis. Next, a 6 mm Ruby pot micro coil was then deployed. Completion splenic angiogram demonstrated complete embolization of the inferior polar branch artery. The catheters were removed. A TR band was applied to the left radial access site in the sheath was removed. The patient tolerated the procedure well was transferred to the recovery area in good condition. Proved preprocedure laboratory values demonstrated hemoglobin of 5.5, therefore request was made to the Hospitalist service for admission for transfusion and pain control. IMPRESSION: 1. Technically successful particle and coil embolization of the inferior polar splenic artery with resulting embolization approximately 40-50% of the total splenic volume. 2. Technically successful ultrasound-guided paracentesis yielding 4 L  ascites. PLAN: Follow-up in Interventional Radiology Portal Hypert[facility] in 1 month. Marliss Coots, MD [facility] Radiology Electronically Signed   By: Marliss Coots M.D.   On: 01/06/2023 13:48    Labs:  CBC: Recent Labs    12/23/22 0844 01/06/23 1016 01/06/23 1110 01/07/23 0545  WBC 4.1 3.3* 3.4* 6.6  HGB 7.8* 5.5* 6.8* 8.3*  HCT 24.9* 18.2* 22.2* 26.3*  PLT 105* 101* 115* 141*    COAGS: Recent Labs    09/18/22 1253 09/19/22 0359 11/03/22 0827 11/17/22 0114 01/06/23 1016 01/06/23 1427  INR 1.4*  --  1.4* 1.1 1.4*  --   APTT 35 36  --  33  --  34    BMP: Recent Labs    12/19/22 0413 12/20/22 0423 01/06/23 1016 01/07/23 0545  NA 138 138 138 138  K 4.3 3.7 4.5 4.9  CL 110 112* 109 109  CO2 22 22 23 23   GLUCOSE 135* 267* 122* 145*  BUN 35* 36* 43* 38*  CALCIUM 7.9* 7.3* 8.5* 8.4*  CREATININE 1.60* 1.52* 1.43* 1.35*  GFRNONAA 36* 38* 41* 44*    LIVER FUNCTION TESTS: Recent Labs    11/04/22 0432 11/17/22 0114 12/10/22 1003 12/18/22 0523  BILITOT 2.6* 1.5* 1.4* 1.3*  AST 35 52* 45* 33  ALT 13 21 24 18   ALKPHOS 65 122 129* 106  PROT 4.0* 5.0* 4.8* 3.9*  ALBUMIN 2.1* 2.5* 2.3* 2.1*    Assessment and Plan: Partial splenic embolization by Dr. Elby Showers 5/21 Patient confused this AM, somnolent.  Arouses but unable to remain alert.  Procedure site intact.  Lactulose enema ordered as patient is refusing PO meds.  Discussed with RN.  No concerns noted.  Patient at risk for post-embolization syndrome, but thus far appears stable, without pain.   IR remains available.  Appreciate hospitalist management of multiple medical comorbidities.   Plan for follow-up in 1 month with CTA BRTO protocol prior to visit with Dr. Elby Showers.  Schedulers aware and will reach out to patient with date and time of follow-up after discharge.   Electronically Signed: Hoyt Koch, PA 01/07/2023, 11:05 AM   I spent  a total of 15 Minutes at the the patient's bedside AND on the patient's hospital floor or unit, greater than 50% of which was counseling/coordinating care for partial splenic embolization.

## 2023-01-07 NOTE — Assessment & Plan Note (Signed)
Appears to be at baseline. -Monitor renal function -Avoid nephrotoxins

## 2023-01-07 NOTE — Progress Notes (Signed)
Progress Note   Patient: Rachel Huber ZOX:[id number] DOB: 11/11/1955 DOA: 01/06/2023     0 DOS: the patient was seen and examined on 01/07/2023   Brief hospital course: Taken from H&P.   TENNISHA BEEDE is a 67 y.o. 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 Elby Showers of IR, and was found have worsening anemia.  Patient currently needing almost weekly transfusions.  1 unit of PRBC ordered.  5/22: Vital stable.  Hemoglobin improved to 8.3 from 5.5 on admission..  WBC and platelets are also improving.  Reticulocyte count elevated.  B12 normal.  Iron studies consistent with anemia of chronic disease.  Patient more somnolent with elevated ammonia level at 69, refusing p.o. lactulose, started on enema    Assessment and Plan: * Anemia Has had chronic anemia actually pancytopenia and has been following up oncology.  There are multiple factors for anemia including bleeding from internal hemorrhoids as well as splenic sequestration.  Requiring multiple transfusions  S/p 1 unit, hemoglobin today was 8.3. Anemia panel with anemia of chronic disease and no specific deficiency. Protein electrophoresis pending. -Continue to monitor -Transfuse if below 7  Liver cirrhosis secondary to NASH [facility]) Patient with history of decompensated liver cirrhosis and recurrent ascites.  S/p paracentesis with removal of 4 L of fluid yesterday. Patient with increased somnolence and ammonia level of 69-concerning of hepatic encephalopathy.  No recorded BM recently. Apparently was only taking MiraLAX at home as  needed. Refused to take p.o. lactulose -Start him on lactulose enema-titrate for 2-3 soft bowel movements. -Discussed with husband to encourage taking her p.o. lactulose at home to avoid hepatic encephalopathy.  Splenic sequestration Chest post and partial splenic embolization on 5/21.  Thrombocytopenia (HCC) Patient has chronic pancytopenia.  Multifactorial with chronic disease, liver cirrhosis and splenic sequestration.  Some improvement today.  Internal hemorrhoid Apparently these have been ? bleeding requiring multiple transfusions for patient.  Monitoring clinically.  No active bleeding today  Chronic kidney disease, stage 3b (HCC) Appears to be at baseline. -Monitor renal function -Avoid nephrotoxins  Hypotension -Continue home midodrine  Depression with anxiety -Continue home meds after the med rec is completed  Pain-resolved as of 01/07/2023 Pain due to splenic artery embolization.  Seems to be mild to moderate at this time.  Patient requesting Tylenol we will start there.  Patient still seems to be slightly sedated from her sedation, therefore will defer starting opiates at this time.  However no objection once the patient wakes up to give her opiates as well if needed.   Subjective: Patient was very somnolent during morning rounds.  We were able to woke her up but she was dozing back off and unable to answer most of the questions.  Refusing to take p.o. lactulose.  Physical Exam: Vitals:   01/07/23 0131 01/07/23 0254 01/07/23 0540 01/07/23 0735  BP: (!) 137/53 (!) 134/49 (!) 130/51 (!) 135/53  Pulse: 78 76 78 81  Resp: (!) 22 20 19 18   Temp: 98.8 F (37.1 C) 98.7 F (37.1 C) 99.4 F (37.4 C) 100 F (37.8 C)  TempSrc:  Oral    SpO2: 91% 92% 91% 96%  Weight:  Height:       General.  Obese, lethargic lady, in no acute distress. Pulmonary.  Lungs clear bilaterally, normal respiratory effort. CV.  Regular rate and rhythm, no JVD, rub or murmur. Abdomen.   Soft, nontender, nondistended, BS positive. CNS.  Somnolent.  No focal neurologic deficit. Extremities.  No edema, no cyanosis, pulses intact and symmetrical. Psychiatry.  Appears to have some cognitive impairment  Data Reviewed: Prior data reviewed  Family Communication: Discussed with husband on phone  Disposition: Status is: Observation The patient will require care spanning > 2 midnights and should be moved to inpatient because: Severity of illness  Planned Discharge Destination: Home   Time spent: 45 minutes  This record has been created using Conservation officer, historic buildings. Errors have been sought and corrected,but may not always be located. Such creation errors do not reflect on the standard of care.   Author: Arnetha Courser, MD 01/07/2023 1:19 PM  For on call review [email or web address].

## 2023-01-07 NOTE — Assessment & Plan Note (Signed)
Continue home midodrine. 

## 2023-01-07 NOTE — Assessment & Plan Note (Signed)
-  Continue home meds after the med rec is completed

## 2023-01-07 NOTE — Hospital Course (Addendum)
Taken from H&P.   Rachel Huber is a 67 y.o. 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 Elby Showers of IR, and was found have worsening anemia.  Patient currently needing almost weekly transfusions.  1 unit of PRBC ordered.  5/22: Vital stable.  Hemoglobin improved to 8.3 from 5.5 on admission..  WBC and platelets are also improving.  Reticulocyte count elevated.  B12 normal.  Iron studies consistent with anemia of chronic disease.  Patient more somnolent with elevated ammonia level at 69, refusing p.o. lactulose, started on enema.  5/23: Patient remained lethargic and oriented to self only.  CBC with worsening leukocytosis, Some worsening of liver enzymes, T. bili at 4.  Discussed with IR and according to them it was anticipated.  Holding off to antibiotics with close monitoring, if continue to get worse then she will need antibiotic.  Continuing with lactulose enema. Addendum.  Patient developed up to 102 temperature.  Ordered infectious workup with blood cultures, UA, chest x-ray and abdominal CT due to her history of recent splenic embolization and paracentesis.  Starting her on Zosyn.

## 2023-01-07 NOTE — Assessment & Plan Note (Signed)
Patient with history of decompensated liver cirrhosis and recurrent ascites.  S/p paracentesis with removal of 4 L of fluid yesterday. Patient with increased somnolence and ammonia level of 69-concerning of hepatic encephalopathy.  No recorded BM recently. Apparently was only taking MiraLAX at home as needed. Refused to take p.o. lactulose -Start him on lactulose enema-titrate for 2-3 soft bowel movements. -Discussed with husband to encourage taking her p.o. lactulose at home to avoid hepatic encephalopathy.

## 2023-01-08 ENCOUNTER — Inpatient Hospital Stay: Payer: Medicare Other

## 2023-01-08 DIAGNOSIS — K7581 Nonalcoholic steatohepatitis (NASH): Secondary | ICD-10-CM | POA: Diagnosis not present

## 2023-01-08 DIAGNOSIS — D7389 Other diseases of spleen: Secondary | ICD-10-CM | POA: Diagnosis not present

## 2023-01-08 DIAGNOSIS — D696 Thrombocytopenia, unspecified: Secondary | ICD-10-CM | POA: Diagnosis not present

## 2023-01-08 DIAGNOSIS — D649 Anemia, unspecified: Secondary | ICD-10-CM | POA: Diagnosis not present

## 2023-01-08 LAB — COMPREHENSIVE METABOLIC PANEL
ALT: 21 U/L (ref 0–44)
AST: 62 U/L — ABNORMAL HIGH (ref 15–41)
Albumin: 2.6 g/dL — ABNORMAL LOW (ref 3.5–5.0)
Alkaline Phosphatase: 120 U/L (ref 38–126)
Anion gap: 9 (ref 5–15)
BUN: 37 mg/dL — ABNORMAL HIGH (ref 8–23)
CO2: 20 mmol/L — ABNORMAL LOW (ref 22–32)
Calcium: 8.3 mg/dL — ABNORMAL LOW (ref 8.9–10.3)
Chloride: 109 mmol/L (ref 98–111)
Creatinine, Ser: 1.59 mg/dL — ABNORMAL HIGH (ref 0.44–1.00)
GFR, Estimated: 36 mL/min — ABNORMAL LOW (ref >60)
Glucose, Bld: 140 mg/dL — ABNORMAL HIGH (ref 70–99)
Potassium: 4.7 mmol/L (ref 3.5–5.1)
Sodium: 138 mmol/L (ref 135–145)
Total Bilirubin: 4.1 mg/dL — ABNORMAL HIGH (ref 0.3–1.2)
Total Protein: 5.3 g/dL — ABNORMAL LOW (ref 6.5–8.1)

## 2023-01-08 LAB — CBC
HCT: 29.7 % — ABNORMAL LOW (ref 36.0–46.0)
Hemoglobin: 9.4 g/dL — ABNORMAL LOW (ref 12.0–15.0)
MCH: 24.4 pg — ABNORMAL LOW (ref 26.0–34.0)
MCHC: 31.6 g/dL (ref 30.0–36.0)
MCV: 76.9 fL — ABNORMAL LOW (ref 80.0–100.0)
Platelets: 124 10*3/uL — ABNORMAL LOW (ref 150–400)
RBC: 3.86 MIL/uL — ABNORMAL LOW (ref 3.87–5.11)
RDW: 19.2 % — ABNORMAL HIGH (ref 11.5–15.5)
WBC: 18.7 10*3/uL — ABNORMAL HIGH (ref 4.0–10.5)
nRBC: 0.5 % — ABNORMAL HIGH (ref 0.0–0.2)

## 2023-01-08 MED ORDER — LACTATED RINGERS IV SOLN: INTRAVENOUS | Status: AC

## 2023-01-08 MED ORDER — PIPERACILLIN-TAZOBACTAM 3.375 G IVPB
3.3750 g | Freq: Three times a day (TID) | INTRAVENOUS | Status: DC
  Administered 2023-01-08 – 2023-01-12 (×11): 3.375 g via INTRAVENOUS
  Filled 2023-01-08 (×11): qty 50

## 2023-01-08 MED ORDER — IOHEXOL 9 MG/ML PO SOLN: 500.0000 mL | ORAL | Status: AC

## 2023-01-08 NOTE — Assessment & Plan Note (Signed)
S/P partial splenic embolization on 5/21 by IR. Worsening leukocytosis and T. bili today-per IR it was anticipated. -Low threshold to start Zosyn if developed fever or worsening of leukocytosis.

## 2023-01-08 NOTE — Progress Notes (Addendum)
Referring Physician(s): Locklear,Cameron T  Supervising Physician: Simonne Come  Patient Status:  [facility] - In-pt  Chief Complaint: S/p partial splenic embolization in IR 5/21  Subjective: Patient denies any rectal bleeding and denies any left wrist pain. She is falling in and out of sleep today, but recognizes me and per floor RN appears more alert today. Per RN and chart review multiple BM yesterday.  Allergies: Gramineae pollens and Latex  Medications: Prior to Admission medications   Medication Sig Start Date End Date Taking? Authorizing Provider  acetaminophen (TYLENOL) 650 MG CR tablet Take 1,300 mg by mouth daily as needed for pain.   Yes [provider]  albuterol (VENTOLIN HFA) 108 (90 Base) MCG/ACT inhaler Inhale 2 puffs into the lungs every 6 (six) hours as needed for wheezing or shortness of breath. 02/10/22  Yes Wieting, Richard, MD  chlorhexidine (PERIDEX) 0.12 % solution Use as directed 15 mLs in the mouth or throat 2 (two) times daily. 12/29/22  Yes [provider]  ciprofloxacin (CIPRO) 500 MG tablet Take 500 mg by mouth 2 (two) times daily.   Yes [provider]  diphenhydrAMINE (BENADRYL) 25 MG tablet Take 25 mg by mouth 2 (two) times daily as needed for itching.   Yes [provider]  ergocalciferol (VITAMIN D2) 1.25 MG (50000 UT) capsule Take 1 capsule (50,000 Units total) by mouth every 14 (fourteen) days. 09/01/22  Yes Pennington, Rebekah M, PA-C  gabapentin (NEURONTIN) 400 MG capsule Take 400 mg by mouth 2 (two) times daily. 01/07/18  Yes [provider]  HYDROcodone-acetaminophen (NORCO/VICODIN) 5-325 MG tablet Take 1 tablet by mouth every 6 (six) hours as needed for moderate pain. 12/29/22  Yes [provider]  melatonin 5 MG TABS Take 10 mg by mouth at bedtime as needed (sleep).   Yes [provider]  midodrine (PROAMATINE) 10 MG tablet Take 10 mg by mouth 3 (three) times daily.   Yes [provider]  Multiple Vitamins-Minerals (MULTIVITAMIN WITH MINERALS) tablet Take 1 tablet by mouth daily.   Yes [provider]  oxymetazoline (AFRIN) 0.05 % nasal spray Place 1 spray into both nostrils daily as needed for congestion.   Yes [provider]  polyethylene glycol (MIRALAX / GLYCOLAX) 17 g packet Take 17 g by mouth 2 (two) times daily. 06/20/22  Yes Enedina Finner, MD  sertraline (ZOLOFT) 50 MG tablet Take 50 mg by mouth daily. 12/12/17  Yes [provider]  spironolactone (ALDACTONE) 25 MG tablet Take 1 tablet (25 mg total) by mouth daily. Patient taking differently: Take 50 mg by mouth daily. 10/26/22  Yes Tat, Onalee Hua, MD  torsemide 40 MG TABS Take 40 mg by mouth daily. 10/26/22  Yes Tat, Onalee Hua, MD  atorvastatin (LIPITOR) 10 MG tablet Take 1 tablet (10 mg total) by mouth daily. Patient not taking: Reported on 01/07/2023 06/20/22   Enedina Finner, MD  cyanocobalamin (VITAMIN B12) 1000 MCG tablet Take 1 tablet (1,000 mcg total) by mouth daily. Patient not taking: Reported on 01/07/2023 12/17/22   Carnella Guadalajara, PA-C  fluconazole (DIFLUCAN) 150 MG tablet Take 1 tablet (150 mg total) by mouth daily. X one dose Patient not taking: Reported on 01/07/2023 12/20/22   Catarina Hartshorn, MD    Vital Signs: BP (!) 130/55   Pulse 90   Temp 98.9 F (37.2 C) (Oral)   Resp 18   Ht 5\' 9"  (1.753 m)   Wt 259 lb (117.5 kg)   SpO2 97%  BMI 38.25 kg/m   Physical Exam General: Awake at times, recognizes me from radiology and previous paracentesis procedures. Falling in and out of sleep with additional questions. Abd: Soft, ND Ext: Left radial artery dressing C/D, soft site without signs of infection, bleeding or hematoma, radial pulse intact and extremity warm with sensation intact  Imaging: IR EMBO TUMOR ORGAN ISCHEMIA INFARCT INC GUIDE ROADMAPPING  Result Date: 01/06/2023 INDICATION: Mrs. Allbee is a 67 year old female with history of NASH cirrhosis with refractory  ascites status post TIPS creation (May 2023, [facility], s/p revision x2), with recurrent, grade 4 internal hemorrhoids associated with anemia requiring transfusions. She underwent "emborrhoid" procedure on 02/07/22 which significantly improved her rectal bleeding initially which gradually returned. She underwent repeat angiogram and embolization of the duplicated bilateral superior rectal arteries on 09/05/22 due to persistent anemia requiring transfusions. Again, she underwent repeat superior rectal embolization on 11/05/22 by my partner, Dr. Archer Asa. Despite this third embolization of the superior rectal arteries, she continues to have rectal bleeding, exacerbated by large volume ascites. She presents today for paracentesis and partial splenic embolization. EXAM: 1. Ultrasound-guided paracentesis. 2. Ultrasound-guided vascular access of the left radial artery. 3. Catheterization and angiography of the celiac, splenic, and inferior polar splenic branches. 4. Particle and coil embolization of the inferior pole splenic artery branch. MEDICATIONS: None. ANESTHESIA/SEDATION: Moderate (conscious) sedation was employed during this procedure. A total of Versed 2 mg and Fentanyl 100 mcg was administered intravenously. Moderate Sedation Time: 76 minutes. The patient's level of consciousness and vital signs were monitored continuously by radiology nursing throughout the procedure under my direct supervision. CONTRAST:  OMNIPAQUE IOHEXOL 300 MG/ML  SOLN FLUOROSCOPY: Radiation Exposure Index (as provided by the fluoroscopic device): 1,139 mGy Kerma COMPLICATIONS: None immediate. PROCEDURE: Informed consent was obtained from the patient following explanation of the procedure, risks, benefits and alternatives. The patient understands, agrees and consents for the procedure. All questions were addressed. A time out was performed prior to the initiation of the procedure. Maximal barrier sterile technique utilized including caps,  mask, sterile gowns, sterile gloves, large sterile drape, hand hygiene, and Betadine prep. Preprocedure ultrasound evaluation of the right upper quadrant demonstrated moderate to large volume ascites. The right upper quadrant was prepped and draped in standard fashion. Subdermal Local anesthesia was provided at the planned needle entry site. A small skin nick was made. Under direct ultrasound visualization, a 10 cm Yueh needle was introduced into the peritoneal cavity. A total of approximately 4 L of translucent, straw-colored fluid was aspirated throughout the procedure. The catheter was then removed and a sterile bandage was applied. The left wrist was prepped and draped in standard fashion. Pulse oximeter was attached to the left thumb. Tora Perches test was performed, grade A. The left radial artery measured 0.20 cm in diameter. Subdermal Local anesthesia was provided at the planned needle entry site with 1% lidocaine. A small skin nick was made. Under direct ultrasound visualization, the left radial artery was punctured with a 21 gauge micropuncture needle. A permanent image was captured and stored in the record. A microwire was placed and exchanged for a 4/5 French slender sheath. The sheath was flushed followed by installation of standard radial cocktail. Under direct fluoroscopic guidance, a 5 Fr MG1 catheter with indwelling Benson wire was directed in retrograde fashion through the left upper extremity arteries to the level of the aortic arch, through the descending thoracic aorta, and into the proximal abdominal aorta. The celiac artery was selected. Celiac angiogram demonstrated  patency and conventional anatomy. The proximal left splenic artery was then selected. Splenic angiogram was performed which demonstrated patency of the splenic artery with the inferior polar branch supplying approximately 40-50% of the total splenic volume. A Renegade hi Flo microcatheter and fathom 16 microwire were then used to  select the inferior polar branch. Inferior polar branch angiogram was performed which demonstrated no evidence of collateralized nontarget arterial branches. Next, under intermittent fluoroscopic guidance, embolization of the inferior polar splenic artery branch was performed with 600 micrometer (3 vials) followed by 800 micrometer (1 vial) Hydropearls. Repeat angiogram demonstrated was near complete stasis. Next, a 6 mm Ruby pot micro coil was then deployed. Completion splenic angiogram demonstrated complete embolization of the inferior polar branch artery. The catheters were removed. A TR band was applied to the left radial access site in the sheath was removed. The patient tolerated the procedure well was transferred to the recovery area in good condition. Proved preprocedure laboratory values demonstrated hemoglobin of 5.5, therefore request was made to the Hospitalist service for admission for transfusion and pain control. IMPRESSION: 1. Technically successful particle and coil embolization of the inferior polar splenic artery with resulting embolization approximately 40-50% of the total splenic volume. 2. Technically successful ultrasound-guided paracentesis yielding 4 L ascites. PLAN: Follow-up in Inter[facility] Radiology [facility] in 1 month. Marliss Coots, MD [facility] Radiology Electronically Signed   By: Marliss Coots M.D.   On: 01/06/2023 13:48   IR Paracentesis  Result Date: 01/06/2023 INDICATION: Mrs. Meehl is a 67 year old female with 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 She underwent "emborrhoid" procedure on 02/07/22 which significantly improved her rectal bleeding initially which gradually returned. She underwent repeat angiogram and embolization of the duplicated  bilateral superior rectal arteries on 09/05/22 due to persistent anemia requiring transfusions. Again, she underwent repeat superior rectal embolization on 11/05/22 by my partner, Dr. Archer Asa. Despite this third embolization of the superior rectal arteries, she continues to have rectal bleeding, exacerbated by large volume ascites. She presents today for paracentesis and partial splenic embolization. EXAM: 1. Ultrasound-guided paracentesis. 2. Ultrasound-guided vascular access of the left radial artery. 3. Catheterization and angiography of the celiac, splenic, and inferior polar splenic branches. 4. Particle and coil embolization of the inferior pole splenic artery branch. MEDICATIONS: None. ANESTHESIA/SEDATION: Moderate (conscious) sedation was employed during this procedure. A total of Versed 2 mg and Fentanyl 100 mcg was administered intravenously. Moderate Sedation Time: 76 minutes. The patient's level of consciousness and vital signs were monitored continuously by radiology nursing throughout the procedure under my direct supervision. CONTRAST:  OMNIPAQUE IOHEXOL 300 MG/ML  SOLN FLUOROSCOPY: Radiation Exposure Index (as provided by the fluoroscopic device): 1,139 mGy Kerma COMPLICATIONS: None immediate. PROCEDURE: Informed consent was obtained from the patient following explanation of the procedure, risks, benefits and alternatives. The patient understands, agrees and consents for the procedure. All questions were addressed. A time out was performed prior to the initiation of the procedure. Maximal barrier sterile technique utilized including caps, mask, sterile gowns, sterile gloves, large sterile drape, hand hygiene, and Betadine prep. Preprocedure ultrasound evaluation of the right upper quadrant demonstrated moderate to large volume ascites. The right upper quadrant was prepped and draped in standard fashion. Subdermal Local anesthesia was provided at the planned needle entry site. A small skin nick  was made. Under direct  ultrasound visualization, a 10 cm Yueh needle was introduced into the peritoneal cavity. A total of approximately 4 L of translucent, straw-colored fluid was aspirated throughout the procedure. The catheter was then removed and a sterile bandage was applied. The left wrist was prepped and draped in standard fashion. Pulse oximeter was attached to the left thumb. Tora Perches test was performed, grade A. The left radial artery measured 0.20 cm in diameter. Subdermal Local anesthesia was provided at the planned needle entry site with 1% lidocaine. A small skin nick was made. Under direct ultrasound visualization, the left radial artery was punctured with a 21 gauge micropuncture needle. A permanent image was captured and stored in the record. A microwire was placed and exchanged for a 4/5 French slender sheath. The sheath was flushed followed by installation of standard radial cocktail. Under direct fluoroscopic guidance, a 5 Fr MG1 catheter with indwelling Benson wire was directed in retrograde fashion through the left upper extremity arteries to the level of the aortic arch, through the descending thoracic aorta, and into the proximal abdominal aorta. The celiac artery was selected. Celiac angiogram demonstrated patency and conventional anatomy. The proximal left splenic artery was then selected. Splenic angiogram was performed which demonstrated patency of the splenic artery with the inferior polar branch supplying approximately 40-50% of the total splenic volume. A Renegade hi Flo microcatheter and fathom 16 microwire were then used to select the inferior polar branch. Inferior polar branch angiogram was performed which demonstrated no evidence of collateralized nontarget arterial branches. Next, under intermittent fluoroscopic guidance, embolization of the inferior polar splenic artery branch was performed with 600 micrometer (3 vials) followed by 800 micrometer (1 vial) Hydropearls. Repeat  angiogram demonstrated was near complete stasis. Next, a 6 mm Ruby pot micro coil was then deployed. Completion splenic angiogram demonstrated complete embolization of the inferior polar branch artery. The catheters were removed. A TR band was applied to the left radial access site in the sheath was removed. The patient tolerated the procedure well was transferred to the recovery area in good condition. Proved preprocedure laboratory values demonstrated hemoglobin of 5.5, therefore request was made to the Hospitalist service for admission for transfusion and pain control. IMPRESSION: 1. Technically successful particle and coil embolization of the inferior polar splenic artery with resulting embolization approximately 40-50% of the total splenic volume. 2. Technically successful ultrasound-guided paracentesis yielding 4 L ascites. PLAN: Follow-up in Interventional Radiology [facility] in 1 month. Marliss Coots, MD [facility] Radiology Electronically Signed   By: Marliss Coots M.D.   On: 01/06/2023 13:48    Labs:  CBC: Recent Labs    12/23/22 0844 01/06/23 1016 01/06/23 1110 01/07/23 0545  WBC 4.1 3.3* 3.4* 6.6  HGB 7.8* 5.5* 6.8* 8.3*  HCT 24.9* 18.2* 22.2* 26.3*  PLT 105* 101* 115* 141*    COAGS: Recent Labs    09/18/22 1253 09/19/22 0359 11/03/22 0827 11/17/22 0114 01/06/23 1016 01/06/23 1427  INR 1.4*  --  1.4* 1.1 1.4*  --   APTT 35 36  --  33  --  34    BMP: Recent Labs    12/19/22 0413 12/20/22 0423 01/06/23 1016 01/07/23 0545  NA 138 138 138 138  K 4.3 3.7 4.5 4.9  CL 110 112* 109 109  CO2 22 22 23 23   GLUCOSE 135* 267* 122* 145*  BUN 35* 36* 43* 38*  CALCIUM 7.9* 7.3* 8.5* 8.4*  CREATININE 1.60* 1.52* 1.43* 1.35*  GFRNONAA 36*  38* 41* 44*    LIVER FUNCTION TESTS: Recent Labs    11/04/22 0432 11/17/22 0114 12/10/22 1003 12/18/22 0523  BILITOT 2.6* 1.5* 1.4* 1.3*  AST 35 52* 45* 33  ALT 13  21 24 18   ALKPHOS 65 122 129* 106  PROT 4.0* 5.0* 4.8* 3.9*  ALBUMIN 2.1* 2.5* 2.3* 2.1*    Assessment and Plan: This is 67 year old female known to our service with PMHx significant for 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 The patient presented as an outpatient 5/21 for partial splenic embolization to help improve her anemia and baseline Hgb was low therefore she was admitted post procedure.  Patient is s/p successful particle and coil embolization of inferior polar splenic arteryr resulting in 40-50% of total splenic volume and paracentesis removing 4L of ascites on 5/21  Patient with left radial artery access site without complication. Dressing can be removed today or tomorrow.   Tmax last evening 100.5 and afebrile today, wbc is elevated today 18.7 (6.6 yesterday), vital signs stable. 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 Recommend closely following patients clinical status, temperature and wbc count. IR will continue to follow and if any concern for sepsis would repeat CT with 3 phase imaging of abdomen and pelvis. Appreciate hospitalists assistance greatly.   Total Bilirubin is also found to be elevated at 4 today, previous bilirubin 1.3 on 5/2, however no recent bilirubin to know her immediate pre procedure levels, LFTs today without significant rise. Would follow LFTs and Bilirubin closely and if they continue to rise, will need further evaluation as this would not be expected from the splenic embolization. This was discussed today with my attending, Dr. Grace Isaac who agrees with the above  plan.   Electronically Signed: Berneta Levins, PA-C 01/08/2023, 9:53 AM   I spent a total of 15 Minutes at the the patient's bedside AND on the patient's hospital floor or unit, greater than 50% of which was counseling/coordinating care for partial splenic embolization.

## 2023-01-08 NOTE — Progress Notes (Addendum)
Progress Note   Patient: Rachel Huber ZOX:[id number] DOB: February 05, 1956 DOA: 01/06/2023     1 DOS: the patient was seen and examined on 01/08/2023   Brief hospital course: Taken from H&P.   Rachel Huber is a 67 y.o. 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 Elby Showers of IR, and was found have worsening anemia.  Patient currently needing almost weekly transfusions.  1 unit of PRBC ordered.  5/22: Vital stable.  Hemoglobin improved to 8.3 from 5.5 on admission..  WBC and platelets are also improving.  Reticulocyte count elevated.  B12 normal.  Iron studies consistent with anemia of chronic disease.  Patient more somnolent with elevated ammonia level at 69, refusing p.o. lactulose, started on enema.  5/23: Patient remained lethargic and oriented to self only.  CBC with worsening leukocytosis, Some worsening of liver enzymes, T. bili at 4.  Discussed with IR and according to them it was anticipated.  Holding off to antibiotics with close monitoring, if continue to get worse then she will need antibiotic.  Continuing with lactulose enema. Addendum.  Patient developed up to 102 temperature.  Ordered infectious workup with blood cultures, UA, chest x-ray and abdominal CT due to her history of recent splenic embolization and paracentesis.  Starting her on Zosyn.   Assessment and Plan: * Anemia Has had chronic anemia actually pancytopenia and has been following up oncology.  There are multiple factors for anemia including bleeding from internal hemorrhoids as well as splenic sequestration.   Requiring multiple transfusions  S/p 1 unit, hemoglobin stable at 9.4 Anemia panel with anemia of chronic disease and no specific deficiency. Protein electrophoresis pending. -Continue to monitor -Transfuse if below 7  Liver cirrhosis secondary to NASH [facility]) Patient with history of decompensated liver cirrhosis and recurrent ascites.  S/p paracentesis with removal of 4 L of fluid yesterday. Patient with increased somnolence and ammonia level of 69-concerning of hepatic encephalopathy.  Started on lactulose enema as she was refusing to take p.o. worsening T. bili today, recently has a procedure Apparently was only taking MiraLAX at home as needed. Continue with lactulose enema and p.o. lactulose if she can take it. -Discussed with husband to encourage taking her p.o. lactulose at home to avoid hepatic encephalopathy.  Splenic sequestration  S/P partial splenic embolization on 5/21 by IR. Worsening leukocytosis and T. bili today-per IR it was anticipated. -Low threshold to start Zosyn if developed fever or worsening of leukocytosis.  Thrombocytopenia (HCC) Patient has chronic pancytopenia.  Multifactorial with chronic disease, liver cirrhosis and splenic sequestration.  Some improvement today.  Internal hemorrhoid Apparently these have been ? bleeding requiring multiple transfusions for patient.  Monitoring clinically.  No active bleeding today  Chronic kidney disease, stage 3b (HCC) Appears to be at baseline. -Monitor renal function -Avoid nephrotoxins  Hypotension -Continue home midodrine  Depression with anxiety -Continue home meds after the med rec is completed  Pain-resolved as of 01/07/2023 Pain due to splenic artery embolization.  Seems to be mild to moderate at this time.  Patient requesting Tylenol we will start there.  Patient still seems to be slightly sedated from her sedation, therefore will defer starting opiates at this time.  However no objection once the patient  wakes up to give her opiates as well if needed.   Subjective: Patient remains somnolent although little more alert than yesterday.  Oriented to self only.  Denies any pain  Physical Exam: Vitals:   01/08/23 0840 01/08/23 1326 01/08/23 1327 01/08/23 1516  BP: (!) 130/55   (!) 125/46  Pulse: 90   84  Resp: 18   16  Temp: 98.9 F (37.2 C) 100.3 F (37.9 C) (!) 103 F (39.4 C) (!) 102.2 F (39 C)  TempSrc: Oral Oral Axillary Oral  SpO2: 97%   98%  Weight:      Height:       General.  Chronically ill-appearing obese lady, in no acute distress.  Scleral icterus Pulmonary.  Lungs clear bilaterally, normal respiratory effort. CV.  Regular rate and rhythm, no JVD, rub or murmur. Abdomen.  Soft, nontender, nondistended, BS positive. CNS.  Awake and oriented to self only.  No focal neurologic deficit. Extremities.  No edema, no cyanosis, pulses intact and symmetrical.  Signs of chronic venous congestion Psychiatry.  Judgment and insight appears impaired  Data Reviewed: Prior data reviewed  Family Communication: Discussed with husband on phone  Disposition: Status is: Inpatient due to severity of illness   Planned Discharge Destination: Home   Time spent: 45 minutes  This record has been created using Conservation officer, historic buildings. Errors have been sought and corrected,but may not always be located. Such creation errors do not reflect on the standard of care.   Author: Arnetha Courser, MD 01/08/2023 3:36 PM  For on call review [email or web address].

## 2023-01-08 NOTE — Assessment & Plan Note (Signed)
Has had chronic anemia actually pancytopenia and has been following up oncology.  There are multiple factors for anemia including bleeding from internal hemorrhoids as well as splenic sequestration.  Requiring multiple transfusions  S/p 1 unit, hemoglobin stable at 9.4 Anemia panel with anemia of chronic disease and no specific deficiency. Protein electrophoresis pending. -Continue to monitor -Transfuse if below 7

## 2023-01-08 NOTE — Assessment & Plan Note (Signed)
Patient with history of decompensated liver cirrhosis and recurrent ascites.  S/p paracentesis with removal of 4 L of fluid yesterday. Patient with increased somnolence and ammonia level of 69-concerning of hepatic encephalopathy.  Started on lactulose enema as she was refusing to take p.o. worsening T. bili today, recently has a procedure Apparently was only taking MiraLAX at home as needed. Continue with lactulose enema and p.o. lactulose if she can take it. -Discussed with husband to encourage taking her p.o. lactulose at home to avoid hepatic encephalopathy.

## 2023-01-09 ENCOUNTER — Inpatient Hospital Stay: Payer: Medicare Other

## 2023-01-09 ENCOUNTER — Encounter: Payer: Self-pay | Admitting: Radiology

## 2023-01-09 DIAGNOSIS — D649 Anemia, unspecified: Secondary | ICD-10-CM | POA: Diagnosis not present

## 2023-01-09 LAB — PROTEIN ELECTROPHORESIS, SERUM
A/G Ratio: 1.3 (ref 0.7–1.7)
Albumin ELP: 2.5 g/dL — ABNORMAL LOW (ref 2.9–4.4)
Alpha-1-Globulin: 0.2 g/dL (ref 0.0–0.4)
Alpha-2-Globulin: 0.3 g/dL — ABNORMAL LOW (ref 0.4–1.0)
Beta Globulin: 0.6 g/dL — ABNORMAL LOW (ref 0.7–1.3)
Gamma Globulin: 0.9 g/dL (ref 0.4–1.8)
Globulin, Total: 2 g/dL — ABNORMAL LOW (ref 2.2–3.9)
Total Protein ELP: 4.5 g/dL — ABNORMAL LOW (ref 6.0–8.5)

## 2023-01-09 LAB — CULTURE, BLOOD (ROUTINE X 2)

## 2023-01-09 LAB — COMPREHENSIVE METABOLIC PANEL
ALT: 16 U/L (ref 0–44)
AST: 37 U/L (ref 15–41)
Albumin: 2.2 g/dL — ABNORMAL LOW (ref 3.5–5.0)
Alkaline Phosphatase: 92 U/L (ref 38–126)
Anion gap: 4 — ABNORMAL LOW (ref 5–15)
BUN: 47 mg/dL — ABNORMAL HIGH (ref 8–23)
CO2: 23 mmol/L (ref 22–32)
Calcium: 7.6 mg/dL — ABNORMAL LOW (ref 8.9–10.3)
Chloride: 110 mmol/L (ref 98–111)
Creatinine, Ser: 1.79 mg/dL — ABNORMAL HIGH (ref 0.44–1.00)
GFR, Estimated: 31 mL/min — ABNORMAL LOW (ref >60)
Glucose, Bld: 161 mg/dL — ABNORMAL HIGH (ref 70–99)
Potassium: 4.4 mmol/L (ref 3.5–5.1)
Sodium: 137 mmol/L (ref 135–145)
Total Bilirubin: 4.1 mg/dL — ABNORMAL HIGH (ref 0.3–1.2)
Total Protein: 4.6 g/dL — ABNORMAL LOW (ref 6.5–8.1)

## 2023-01-09 LAB — CBC
HCT: 25.7 % — ABNORMAL LOW (ref 36.0–46.0)
Hemoglobin: 8.2 g/dL — ABNORMAL LOW (ref 12.0–15.0)
MCH: 24 pg — ABNORMAL LOW (ref 26.0–34.0)
MCHC: 31.9 g/dL (ref 30.0–36.0)
MCV: 75.4 fL — ABNORMAL LOW (ref 80.0–100.0)
Platelets: 123 10*3/uL — ABNORMAL LOW (ref 150–400)
RBC: 3.41 MIL/uL — ABNORMAL LOW (ref 3.87–5.11)
RDW: 19.3 % — ABNORMAL HIGH (ref 11.5–15.5)
WBC: 19.8 10*3/uL — ABNORMAL HIGH (ref 4.0–10.5)
nRBC: 0.3 % — ABNORMAL HIGH (ref 0.0–0.2)

## 2023-01-09 LAB — AMMONIA: Ammonia: 65 umol/L — ABNORMAL HIGH (ref 9–35)

## 2023-01-09 NOTE — Progress Notes (Signed)
PROGRESS NOTE    Rachel Huber  ZOX:[id number] DOB: April 18, 1956 DOA: 01/06/2023 PCP: Lauro Regulus, MD  104A/104A-AA  LOS: 2 days   Brief hospital course:   Assessment & Plan:  Rachel Huber is a 67 y.o. 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 Elby Showers of IR, and was found have worsening anemia.  Patient currently needing almost weekly transfusions.    * Anemia Has had chronic anemia actually pancytopenia and has been following up oncology.  There are multiple factors for anemia including bleeding from internal hemorrhoids as well as splenic sequestration.  Requiring multiple transfusions  Anemia panel with anemia of chronic disease and no specific deficiency. Protein electrophoresis pending. --monitor Hgb and transfuse to keep >7   Liver cirrhosis secondary to NASH (HCC) Recurrent ascites Acute hepatic encephalopathy Patient with history of decompensated liver cirrhosis and recurrent ascites.  S/p paracentesis with removal of 4 L of fluid. Patient with increased somnolence and ammonia level of 69-concerning of hepatic encephalopathy.  Started on lactulose enema as she was refusing to take p.o. worsening T. bili, recently has a procedure Apparently was only taking MiraLAX at home as needed. Plan: --cont lactulose enema, and p.o. lactulose if she can take it. --cont torsemide and aldactone   Splenic sequestration S/P partial splenic embolization on 5/21 by IR. Worsening leukocytosis and T. bili -per IR it was anticipated. --started on Zosyn due to fever and  worsening leukocytosis --cont zosyn   Thrombocytopenia (HCC) Patient has chronic pancytopenia.  Multifactorial with chronic disease, liver cirrhosis and splenic sequestration.     Internal hemorrhoid Apparently these have been ? bleeding requiring multiple transfusions for patient.  Monitoring clinically.     Chronic kidney disease, stage 3b (HCC) Appears to be at baseline. -Monitor renal function   Hypotension -Continue home midodrine   Depression with anxiety -Continue home sertraline   DVT prophylaxis: SCD/Compression stockings Code Status: Full code  Family Communication:  Level of care: Telemetry Medical Dispo:   The patient is from: home Anticipated d/c is to: home Anticipated d/c date is: undetermined   Subjective and Interval History:  Pt followed commands to open her eyes, but didn't answer questions.  Appeared somnolent.   Objective: Vitals:   01/09/23 0700 01/09/23 0837 01/09/23 1145 01/09/23 1710  BP:  (!) 114/44  (!) 111/41  Pulse:  91  87  Resp:  17  18  Temp: 97.8 F (36.6 C) (!) 101.2 F (38.4 C) 99 F (37.2 C) 98.6 F (37 C)  TempSrc: Oral  Oral   SpO2:  93%  99%  Weight:      Height:        Intake/Output Summary (Last 24 hours) at 01/09/2023 1857 Last data filed at 01/09/2023 1300 Gross per 24 hour  Intake 0 ml  Output 200 ml  Net -200 ml   Filed Weights   01/07/23 0013  Weight: 117.5 kg    Examination:   Constitutional: NAD, somnolent HEENT: conjunctivae and lids normal, EOMI CV: No cyanosis.   RESP: normal respiratory effort, on RA Extremities: BLE tree trunk appearing SKIN: warm, dry   Data Reviewed: I have personally reviewed labs and imaging studies  Time spent: 50 minutes  Darlin Priestly, MD [facility] If 7PM-7AM, please contact night-coverage 01/09/2023, 6:57 PM

## 2023-01-09 NOTE — Care Management Important Message (Signed)
Important Message  Patient Details  Name: Rachel Huber MRN: [id number] Date of Birth: 1955-09-03   Medicare Important Message Given:  N/A - LOS <3 / Initial given by admissions     Rachel Huber 01/09/2023, 11:57 AM

## 2023-01-09 NOTE — Progress Notes (Addendum)
Referring Physician(s): Locklear,Cameron T  Supervising Physician: Pernell Dupre  Patient Status:  [facility] - In-pt  Chief Complaint: S/p partial splenic embolization in IR 5/21  Subjective: Patient is disoriented today. She is unable to answer what her name is, where she is, or other questions when asked. Patient is ill-appearing on exam.  Allergies: Gramineae pollens and Latex  Medications: Prior to Admission medications   Medication Sig Start Date End Date Taking? Authorizing Provider  acetaminophen (TYLENOL) 650 MG CR tablet Take 1,300 mg by mouth daily as needed for pain.   Yes [provider]  albuterol (VENTOLIN HFA) 108 (90 Base) MCG/ACT inhaler Inhale 2 puffs into the lungs every 6 (six) hours as needed for wheezing or shortness of breath. 02/10/22  Yes Wieting, Richard, MD  chlorhexidine (PERIDEX) 0.12 % solution Use as directed 15 mLs in the mouth or throat 2 (two) times daily. 12/29/22  Yes [provider]  ciprofloxacin (CIPRO) 500 MG tablet Take 500 mg by mouth 2 (two) times daily.   Yes [provider]  diphenhydrAMINE (BENADRYL) 25 MG tablet Take 25 mg by mouth 2 (two) times daily as needed for itching.   Yes [provider]  ergocalciferol (VITAMIN D2) 1.25 MG (50000 UT) capsule Take 1 capsule (50,000 Units total) by mouth every 14 (fourteen) days. 09/01/22  Yes Pennington, Rebekah M, PA-C  gabapentin (NEURONTIN) 400 MG capsule Take 400 mg by mouth 2 (two) times daily. 01/07/18  Yes [provider]  HYDROcodone-acetaminophen (NORCO/VICODIN) 5-325 MG tablet Take 1 tablet by mouth every 6 (six) hours as needed for moderate pain. 12/29/22  Yes [provider]  melatonin 5 MG TABS Take 10 mg by mouth at bedtime as needed (sleep).   Yes [provider]  midodrine (PROAMATINE) 10 MG tablet Take 10 mg by mouth 3 (three) times daily.   Yes [provider]  Multiple Vitamins-Minerals (MULTIVITAMIN WITH  MINERALS) tablet Take 1 tablet by mouth daily.   Yes [provider]  oxymetazoline (AFRIN) 0.05 % nasal spray Place 1 spray into both nostrils daily as needed for congestion.   Yes [provider]  polyethylene glycol (MIRALAX / GLYCOLAX) 17 g packet Take 17 g by mouth 2 (two) times daily. 06/20/22  Yes Enedina Finner, MD  sertraline (ZOLOFT) 50 MG tablet Take 50 mg by mouth daily. 12/12/17  Yes [provider]  spironolactone (ALDACTONE) 25 MG tablet Take 1 tablet (25 mg total) by mouth daily. Patient taking differently: Take 50 mg by mouth daily. 10/26/22  Yes Tat, Onalee Hua, MD  torsemide 40 MG TABS Take 40 mg by mouth daily. 10/26/22  Yes Tat, Onalee Hua, MD  atorvastatin (LIPITOR) 10 MG tablet Take 1 tablet (10 mg total) by mouth daily. Patient not taking: Reported on 01/07/2023 06/20/22   Enedina Finner, MD  cyanocobalamin (VITAMIN B12) 1000 MCG tablet Take 1 tablet (1,000 mcg total) by mouth daily. Patient not taking: Reported on 01/07/2023 12/17/22   Carnella Guadalajara, PA-C  fluconazole (DIFLUCAN) 150 MG tablet Take 1 tablet (150 mg total) by mouth daily. X one dose Patient not taking: Reported on 01/07/2023 12/20/22   Catarina Hartshorn, MD    Vital Signs: BP (!) 114/44 (BP Location: Left Arm)   Pulse 91   Temp 99 F (37.2 C) (Oral)   Resp 17   Ht 5\' 9"  (1.753 m)   Wt 259 lb (117.5 kg)   SpO2 93%   BMI 38.25 kg/m   Physical Exam Vitals  reviewed.  Constitutional:      Appearance: She is ill-appearing.  Neurological:     Mental Status: She is disoriented.    General: Awake but pleasantly disoriented Abd: Soft, ND Ext: Left radial artery dressing C/D, soft site without signs of infection, bleeding or hematoma, radial pulse intact and extremity warm with sensation intact  Imaging: CT ABDOMEN PELVIS WO CONTRAST  Result Date: 01/08/2023 CLINICAL DATA:  67 y.o. 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 Elby Showers of IR, and was found have worsening anemia. EXAM: CT ABDOMEN AND PELVIS WITHOUT CONTRAST TECHNIQUE: Multidetector CT imaging of the abdomen and pelvis was performed following the standard protocol without IV contrast. RADIATION DOSE REDUCTION: This exam was performed according to the departmental dose-optimization program which includes automated exposure control, adjustment of the mA and/or kV according to patient size and/or use of iterative reconstruction technique. COMPARISON:  CT abdomen and pelvis 09/18/2022 FINDINGS: Lower chest: Small left-greater-than-right pleural effusions. Associated compressive atelectasis. Hepatobiliary: TIPS. Nodular contour of the liver compatible with cirrhosis. Cholelithiasis. No biliary dilation. Pancreas: Unremarkable. Spleen: Embolization coils about the splenic hilum. Splenomegaly measuring 17.5 cm in craniocaudal dimension, previously 16.9 cm. Adrenals/Urinary Tract: Stable adrenal glands. No urinary calculi or hydronephrosis. Stomach/Bowel: Normal caliber large and small bowel. No definite wall thickening. Decompressed stomach. Vascular/Lymphatic: Aortic atherosclerosis. No enlarged abdominal or pelvic lymph nodes. Reproductive: Status post hysterectomy. No adnexal masses. Other: Moderate low-density abdominopelvic ascites. No free intraperitoneal air. Body wall anasarca. Musculoskeletal: No acute abnormality. IMPRESSION: 1. Small left-greater-than-right pleural effusions with associated compressive atelectasis. 2. Cirrhotic liver with TIPS. 3. Splenomegaly, increased from 09/18/2022. 4. Moderate abdominopelvic ascites. Body  wall anasarca. 5. Cholelithiasis. Aortic Atherosclerosis (ICD10-I70.0). Electronically Signed   By: Minerva Fester M.D.   On: 01/08/2023 18:25   DG Chest 2 View  Result Date: 01/08/2023 CLINICAL DATA:  Fever EXAM: CHEST - 2 VIEW COMPARISON:  11/17/2022 FINDINGS: Frontal and lateral views of the chest demonstrates stable enlargement of the cardiac silhouette. There is increased pulmonary vascular congestion, with interval development of interstitial and ground-glass opacities throughout the lungs. Small bilateral pleural effusions. No pneumothorax. Right chest wall port tip overlies superior vena cava. IMPRESSION: 1. Interval development of interstitial and ground-glass opacities throughout the lungs. This could be secondary to volume overload or infection. 2. Small bilateral pleural effusions. 3. Enlarged cardiac silhouette, with increased central vascular congestion. Electronically Signed   By: Sharlet Salina M.D.   On: 01/08/2023 17:12   IR EMBO TUMOR ORGAN ISCHEMIA INFARCT INC GUIDE ROADMAPPING  Result Date: 01/06/2023 INDICATION: Mrs. Midura is a 67 year old female with 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 She underwent "emborrhoid" procedure on 02/07/22 which significantly improved her rectal bleeding initially which gradually returned. She underwent repeat angiogram and embolization of the duplicated bilateral superior rectal arteries on 09/05/22 due to persistent anemia requiring transfusions. Again, she underwent repeat superior rectal embolization on 11/05/22 by my partner, Dr. Archer Asa. Despite this third embolization of the superior rectal arteries, she continues to have rectal bleeding, exacerbated by large volume ascites. She presents  today for paracentesis and partial splenic embolization. EXAM: 1. Ultrasound-guided paracentesis.  2. Ultrasound-guided vascular access of the left radial artery. 3. Catheterization and angiography of the celiac, splenic, and inferior polar splenic branches. 4. Particle and coil embolization of the inferior pole splenic artery branch. MEDICATIONS: None. ANESTHESIA/SEDATION: Moderate (conscious) sedation was employed during this procedure. A total of Versed 2 mg and Fentanyl 100 mcg was administered intravenously. Moderate Sedation Time: 76 minutes. The patient's level of consciousness and vital signs were monitored continuously by radiology nursing throughout the procedure under my direct supervision. CONTRAST:  OMNIPAQUE IOHEXOL 300 MG/ML  SOLN FLUOROSCOPY: Radiation Exposure Index (as provided by the fluoroscopic device): 1,139 mGy Kerma COMPLICATIONS: None immediate. PROCEDURE: Informed consent was obtained from the patient following explanation of the procedure, risks, benefits and alternatives. The patient understands, agrees and consents for the procedure. All questions were addressed. A time out was performed prior to the initiation of the procedure. Maximal barrier sterile technique utilized including caps, mask, sterile gowns, sterile gloves, large sterile drape, hand hygiene, and Betadine prep. Preprocedure ultrasound evaluation of the right upper quadrant demonstrated moderate to large volume ascites. The right upper quadrant was prepped and draped in standard fashion. Subdermal Local anesthesia was provided at the planned needle entry site. A small skin nick was made. Under direct ultrasound visualization, a 10 cm Yueh needle was introduced into the peritoneal cavity. A total of approximately 4 L of translucent, straw-colored fluid was aspirated throughout the procedure. The catheter was then removed and a sterile bandage was applied. The left wrist was prepped and draped in standard fashion. Pulse oximeter was attached to the left thumb. Tora Perches test was performed, grade A. The left radial  artery measured 0.20 cm in diameter. Subdermal Local anesthesia was provided at the planned needle entry site with 1% lidocaine. A small skin nick was made. Under direct ultrasound visualization, the left radial artery was punctured with a 21 gauge micropuncture needle. A permanent image was captured and stored in the record. A microwire was placed and exchanged for a 4/5 French slender sheath. The sheath was flushed followed by installation of standard radial cocktail. Under direct fluoroscopic guidance, a 5 Fr MG1 catheter with indwelling Benson wire was directed in retrograde fashion through the left upper extremity arteries to the level of the aortic arch, through the descending thoracic aorta, and into the proximal abdominal aorta. The celiac artery was selected. Celiac angiogram demonstrated patency and conventional anatomy. The proximal left splenic artery was then selected. Splenic angiogram was performed which demonstrated patency of the splenic artery with the inferior polar branch supplying approximately 40-50% of the total splenic volume. A Renegade hi Flo microcatheter and fathom 16 microwire were then used to select the inferior polar branch. Inferior polar branch angiogram was performed which demonstrated no evidence of collateralized nontarget arterial branches. Next, under intermittent fluoroscopic guidance, embolization of the inferior polar splenic artery branch was performed with 600 micrometer (3 vials) followed by 800 micrometer (1 vial) Hydropearls. Repeat angiogram demonstrated was near complete stasis. Next, a 6 mm Ruby pot micro coil was then deployed. Completion splenic angiogram demonstrated complete embolization of the inferior polar branch artery. The catheters were removed. A TR band was applied to the left radial access site in the sheath was removed. The patient tolerated the procedure well was transferred to the recovery area in good condition. Proved preprocedure laboratory values  demonstrated hemoglobin of 5.5, therefore request was made to the Hospitalist service for admission for  transfusion and pain control. IMPRESSION: 1. Technically successful particle and coil embolization of the inferior polar splenic artery with resulting embolization approximately 40-50% of the total splenic volume. 2. Technically successful ultrasound-guided paracentesis yielding 4 L ascites. PLAN: Follow-up in [facility] in 1 month. Marliss Coots, MD [facility] Radiology Electronically Signed   By: Marliss Coots M.D.   On: 01/06/2023 13:48   IR Paracentesis  Result Date: 01/06/2023 INDICATION: Mrs. Hirschi is a 67 year old female with 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 She underwent "emborrhoid" procedure on 02/07/22 which significantly improved her rectal bleeding initially which gradually returned. She underwent repeat angiogram and embolization of the duplicated bilateral superior rectal arteries on 09/05/22 due to persistent anemia requiring transfusions. Again, she underwent repeat superior rectal embolization on 11/05/22 by my partner, Dr. Archer Asa. Despite this third embolization of the superior rectal arteries, she continues to have rectal bleeding, exacerbated by large volume ascites. She presents today for paracentesis and partial splenic embolization. EXAM: 1. Ultrasound-guided paracentesis. 2. Ultrasound-guided vascular access of the left radial artery. 3. Catheterization and angiography of the celiac, splenic, and inferior polar splenic branches. 4. Particle and coil embolization of the inferior pole splenic artery branch. MEDICATIONS: None. ANESTHESIA/SEDATION: Moderate (conscious) sedation was employed during this procedure. A total of Versed  2 mg and Fentanyl 100 mcg was administered intravenously. Moderate Sedation Time: 76 minutes. The patient's level of consciousness and vital signs were monitored continuously by radiology nursing throughout the procedure under my direct supervision. CONTRAST:  OMNIPAQUE IOHEXOL 300 MG/ML  SOLN FLUOROSCOPY: Radiation Exposure Index (as provided by the fluoroscopic device): 1,139 mGy Kerma COMPLICATIONS: None immediate. PROCEDURE: Informed consent was obtained from the patient following explanation of the procedure, risks, benefits and alternatives. The patient understands, agrees and consents for the procedure. All questions were addressed. A time out was performed prior to the initiation of the procedure. Maximal barrier sterile technique utilized including caps, mask, sterile gowns, sterile gloves, large sterile drape, hand hygiene, and Betadine prep. Preprocedure ultrasound evaluation of the right upper quadrant demonstrated moderate to large volume ascites. The right upper quadrant was prepped and draped in standard fashion. Subdermal Local anesthesia was provided at the planned needle entry site. A small skin nick was made. Under direct ultrasound visualization, a 10 cm Yueh needle was introduced into the peritoneal cavity. A total of approximately 4 L of translucent, straw-colored fluid was aspirated throughout the procedure. The catheter was then removed and a sterile bandage was applied. The left wrist was prepped and draped in standard fashion. Pulse oximeter was attached to the left thumb. Tora Perches test was performed, grade A. The left radial artery measured 0.20 cm in diameter. Subdermal Local anesthesia was provided at the planned needle entry site with 1% lidocaine. A small skin nick was made. Under direct ultrasound visualization, the left radial artery was punctured with a 21 gauge micropuncture needle. A permanent image was captured and stored in the record. A microwire was placed and exchanged  for a 4/5 French slender sheath. The sheath was flushed followed by installation of standard radial cocktail. Under direct fluoroscopic guidance, a 5 Fr MG1 catheter with indwelling Benson wire was directed in retrograde fashion through the left upper extremity arteries to the level of the aortic arch, through the descending thoracic aorta, and into the proximal  abdominal aorta. The celiac artery was selected. Celiac angiogram demonstrated patency and conventional anatomy. The proximal left splenic artery was then selected. Splenic angiogram was performed which demonstrated patency of the splenic artery with the inferior polar branch supplying approximately 40-50% of the total splenic volume. A Renegade hi Flo microcatheter and fathom 16 microwire were then used to select the inferior polar branch. Inferior polar branch angiogram was performed which demonstrated no evidence of collateralized nontarget arterial branches. Next, under intermittent fluoroscopic guidance, embolization of the inferior polar splenic artery branch was performed with 600 micrometer (3 vials) followed by 800 micrometer (1 vial) Hydropearls. Repeat angiogram demonstrated was near complete stasis. Next, a 6 mm Ruby pot micro coil was then deployed. Completion splenic angiogram demonstrated complete embolization of the inferior polar branch artery. The catheters were removed. A TR band was applied to the left radial access site in the sheath was removed. The patient tolerated the procedure well was transferred to the recovery area in good condition. Proved preprocedure laboratory values demonstrated hemoglobin of 5.5, therefore request was made to the Hospitalist service for admission for transfusion and pain control. IMPRESSION: 1. Technically successful particle and coil embolization of the inferior polar splenic artery with resulting embolization approximately 40-50% of the total splenic volume. 2. Technically successful ultrasound-guided  paracentesis yielding 4 L ascites. PLAN: Follow-up in Inter[facility] Radiology [facility] in 1 month. Marliss Coots, MD [facility] Radiology Electronically Signed   By: Marliss Coots M.D.   On: 01/06/2023 13:48    Labs:  CBC: Recent Labs    01/06/23 1110 01/07/23 0545 01/08/23 0929 01/09/23 0441  WBC 3.4* 6.6 18.7* 19.8*  HGB 6.8* 8.3* 9.4* 8.2*  HCT 22.2* 26.3* 29.7* 25.7*  PLT 115* 141* 124* 123*     COAGS: Recent Labs    09/18/22 1253 09/19/22 0359 11/03/22 0827 11/17/22 0114 01/06/23 1016 01/06/23 1427  INR 1.4*  --  1.4* 1.1 1.4*  --   APTT 35 36  --  33  --  34     BMP: Recent Labs    01/06/23 1016 01/07/23 0545 01/08/23 0929 01/09/23 0441  NA 138 138 138 137  K 4.5 4.9 4.7 4.4  CL 109 109 109 110  CO2 23 23 20* 23  GLUCOSE 122* 145* 140* 161*  BUN 43* 38* 37* 47*  CALCIUM 8.5* 8.4* 8.3* 7.6*  CREATININE 1.43* 1.35* 1.59* 1.79*  GFRNONAA 41* 44* 36* 31*     LIVER FUNCTION TESTS: Recent Labs    12/10/22 1003 12/18/22 0523 01/08/23 0929 01/09/23 0441  BILITOT 1.4* 1.3* 4.1* 4.1*  AST 45* 33 62* 37  ALT 24 18 21 16   ALKPHOS 129* 106 120 92  PROT 4.8* 3.9* 5.3* 4.6*  ALBUMIN 2.3* 2.1* 2.6* 2.2*     Assessment and Plan: This is 67 year old female known to our service with PMHx significant for 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 The patient presented as an outpatient 5/21 for partial splenic embolization to help improve her anemia and baseline Hgb was low therefore she was admitted post procedure.  Patient is s/p successful particle and coil embolization of inferior polar splenic artery resulting in 40-50% of  total splenic volume and paracentesis removing 4L of  ascites on 5/21  Patient with left radial artery access site without complication. Dressing should be removed today.  Tmax last evening 103.1 this AM, wbc remains elevated today 19.8 (18.7 yesterday), vital signs stable. 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 CT performed yesterday did not reveal concern for abscess but did note increase in size of spleen.   Total Bilirubin is also found to be elevated at 4.1 today (stable from yesterday), previous bilirubin 1.3 on 5/2, however no recent bilirubin to know her immediate pre procedure levels, LFTs today within normal limits today. Would follow LFTs and Bilirubin closely and if they continue to rise, will need further evaluation as this would not be expected from the splenic embolization. This was discussed today with  Dr. Juliette Alcide who agrees with the above plan.   Electronically Signed: Kennieth Francois, PA-C 01/09/2023, 4:14 PM   I spent a total of 15 Minutes at the the patient's bedside AND on the patient's hospital floor or unit, greater than 50% of which was counseling/coordinating care for partial splenic embolization.

## 2023-01-10 DIAGNOSIS — D649 Anemia, unspecified: Secondary | ICD-10-CM | POA: Diagnosis not present

## 2023-01-10 LAB — BASIC METABOLIC PANEL
Anion gap: 6 (ref 5–15)
BUN: 56 mg/dL — ABNORMAL HIGH (ref 8–23)
CO2: 22 mmol/L (ref 22–32)
Calcium: 7.7 mg/dL — ABNORMAL LOW (ref 8.9–10.3)
Chloride: 111 mmol/L (ref 98–111)
Creatinine, Ser: 1.97 mg/dL — ABNORMAL HIGH (ref 0.44–1.00)
GFR, Estimated: 28 mL/min — ABNORMAL LOW (ref >60)
Glucose, Bld: 122 mg/dL — ABNORMAL HIGH (ref 70–99)
Potassium: 4.1 mmol/L (ref 3.5–5.1)
Sodium: 139 mmol/L (ref 135–145)

## 2023-01-10 LAB — HEPATIC FUNCTION PANEL
ALT: 14 U/L (ref 0–44)
AST: 30 U/L (ref 15–41)
Albumin: 1.9 g/dL — ABNORMAL LOW (ref 3.5–5.0)
Alkaline Phosphatase: 78 U/L (ref 38–126)
Bilirubin, Direct: 1.4 mg/dL — ABNORMAL HIGH (ref 0.0–0.2)
Indirect Bilirubin: 1.7 mg/dL — ABNORMAL HIGH (ref 0.3–0.9)
Total Bilirubin: 3.1 mg/dL — ABNORMAL HIGH (ref 0.3–1.2)
Total Protein: 4.5 g/dL — ABNORMAL LOW (ref 6.5–8.1)

## 2023-01-10 LAB — CBC
HCT: 23.3 % — ABNORMAL LOW (ref 36.0–46.0)
Hemoglobin: 7.4 g/dL — ABNORMAL LOW (ref 12.0–15.0)
MCH: 24.1 pg — ABNORMAL LOW (ref 26.0–34.0)
MCHC: 31.8 g/dL (ref 30.0–36.0)
MCV: 75.9 fL — ABNORMAL LOW (ref 80.0–100.0)
Platelets: 135 10*3/uL — ABNORMAL LOW (ref 150–400)
RBC: 3.07 MIL/uL — ABNORMAL LOW (ref 3.87–5.11)
RDW: 19.8 % — ABNORMAL HIGH (ref 11.5–15.5)
WBC: 16.5 10*3/uL — ABNORMAL HIGH (ref 4.0–10.5)
nRBC: 0 % (ref 0.0–0.2)

## 2023-01-10 LAB — MAGNESIUM: Magnesium: 1.7 mg/dL (ref 1.7–2.4)

## 2023-01-10 MED ORDER — ONDANSETRON 4 MG PO TBDP
4.0000 mg | ORAL_TABLET | Freq: Three times a day (TID) | ORAL | Status: DC | PRN
  Administered 2023-01-12: 4 mg via ORAL
  Filled 2023-01-10: qty 1

## 2023-01-10 MED ORDER — ONDANSETRON HCL 4 MG/2ML IJ SOLN
4.0000 mg | Freq: Four times a day (QID) | INTRAMUSCULAR | Status: DC | PRN
  Administered 2023-01-10: 4 mg via INTRAVENOUS
  Filled 2023-01-10: qty 2

## 2023-01-10 NOTE — Progress Notes (Signed)
PROGRESS NOTE    Rachel Huber  YNW:[id number] DOB: [birth year] DOA: 01/06/2023 PCP: Lauro Regulus, MD  104A/104A-AA  LOS: 3 days   Brief hospital course:   Assessment & Plan: Rachel Huber is a 67 y.o. 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 Elby Showers of IR, and was found have worsening anemia.  Patient currently needing almost weekly transfusions.    * Anemia Has had chronic anemia actually pancytopenia and has been following up oncology.  There are multiple factors for anemia including bleeding from internal hemorrhoids as well as splenic sequestration.  Requiring multiple transfusions  Anemia panel with anemia of chronic disease and no specific deficiency. Protein electrophoresis pending. --monitor Hgb and transfuse to keep >7   Liver cirrhosis secondary to NASH (HCC) Recurrent ascites Acute hepatic encephalopathy Patient with history of decompensated liver cirrhosis and recurrent ascites.  S/p paracentesis with removal of 4 L of fluid. Patient with increased somnolence and ammonia level of 69-concerning of hepatic encephalopathy.  Started on lactulose enema as she was refusing to take p.o. worsening T. bili, recently has a procedure Apparently was only taking MiraLAX at home as needed. Plan: --cont oral lactulose now that pt is alert --cont torsemide and aldactone   Splenic sequestration S/P partial splenic embolization on 5/21 by IR. Worsening leukocytosis and T. bili -per IR it was anticipated. --started on Zosyn due to fever and worsening  leukocytosis --cont Zosyn for now   Thrombocytopenia [facility]) Patient has chronic pancytopenia.  Multifactorial with chronic disease, liver cirrhosis and splenic sequestration.     Internal hemorrhoid Apparently these have been ? bleeding requiring multiple transfusions for patient.  Monitoring clinically.     Chronic kidney disease, stage 3b (HCC) Appears to be at baseline. -Monitor renal function   Hypotension -Continue home midodrine   Depression with anxiety -Continue home sertraline   DVT prophylaxis: SCD/Compression stockings Code Status: Full code  Family Communication:  Level of care: Telemetry Medical Dispo:   The patient is from: home Anticipated d/c is to: home Anticipated d/c date is: undetermined   Subjective and Interval History:  Pt was much more alert today.  Said she ate a lot and threw up afterwards.   Objective: Vitals:   01/09/23 1710 01/09/23 1933 01/10/23 0408 01/10/23 0818  BP: (!) 111/41 (!) 123/41 (!) 109/45 (!) 119/50  Pulse: 87 78 67 68  Resp: 18 20 20 16   Temp: 98.6 F (37 C) 98.7 F (37.1 C) 99 F (37.2 C) 99.2 F (37.3 C)  TempSrc:      SpO2: 99% 97% 98% 97%  Weight:      Height:        Intake/Output Summary (Last 24 hours) at 01/10/2023 1547 Last data filed at 01/10/2023 1300 Gross per 24 hour  Intake --  Output 301 ml  Net -301 ml   Filed Weights   01/07/23 0013  Weight: 117.5 kg    Examination:   Constitutional: NAD, alert, oriented to person and place, coherent  HEENT: conjunctivae and lids normal, EOMI CV: No cyanosis.   RESP: normal respiratory effort, on RA Neuro: II - XII grossly intact.   Psych: Normal mood and affect.  Data Reviewed: I have personally reviewed labs and imaging studies  Time spent: 35 minutes  Darlin Priestly, MD [facility] If 7PM-7AM, please contact night-coverage 01/10/2023, 3:47 PM

## 2023-01-11 DIAGNOSIS — D649 Anemia, unspecified: Secondary | ICD-10-CM | POA: Diagnosis not present

## 2023-01-11 LAB — CULTURE, BLOOD (ROUTINE X 2)

## 2023-01-11 LAB — CBC
HCT: 24.5 % — ABNORMAL LOW (ref 36.0–46.0)
Hemoglobin: 7.6 g/dL — ABNORMAL LOW (ref 12.0–15.0)
MCH: 23.4 pg — ABNORMAL LOW (ref 26.0–34.0)
MCHC: 31 g/dL (ref 30.0–36.0)
MCV: 75.4 fL — ABNORMAL LOW (ref 80.0–100.0)
Platelets: 149 10*3/uL — ABNORMAL LOW (ref 150–400)
RBC: 3.25 MIL/uL — ABNORMAL LOW (ref 3.87–5.11)
RDW: 20.2 % — ABNORMAL HIGH (ref 11.5–15.5)
WBC: 13 10*3/uL — ABNORMAL HIGH (ref 4.0–10.5)
nRBC: 0 % (ref 0.0–0.2)

## 2023-01-11 LAB — BASIC METABOLIC PANEL
Anion gap: 4 — ABNORMAL LOW (ref 5–15)
BUN: 56 mg/dL — ABNORMAL HIGH (ref 8–23)
CO2: 23 mmol/L (ref 22–32)
Calcium: 7.7 mg/dL — ABNORMAL LOW (ref 8.9–10.3)
Chloride: 110 mmol/L (ref 98–111)
Creatinine, Ser: 1.89 mg/dL — ABNORMAL HIGH (ref 0.44–1.00)
GFR, Estimated: 29 mL/min — ABNORMAL LOW (ref >60)
Glucose, Bld: 128 mg/dL — ABNORMAL HIGH (ref 70–99)
Potassium: 3.9 mmol/L (ref 3.5–5.1)
Sodium: 137 mmol/L (ref 135–145)

## 2023-01-11 LAB — MAGNESIUM: Magnesium: 2 mg/dL (ref 1.7–2.4)

## 2023-01-11 NOTE — Progress Notes (Signed)
PROGRESS NOTE    Rachel Huber  ZOX:[id number] DOB: July 13, 1956 DOA: 01/06/2023 PCP: Lauro Regulus, MD  104A/104A-AA  LOS: 4 days   Brief hospital course:   Assessment & Plan: Rachel Huber is a 67 y.o. 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 Elby Showers of IR, and was found have worsening anemia.  Patient currently needing almost weekly transfusions.    * Anemia Has had chronic anemia actually pancytopenia and has been following up oncology.  There are multiple factors for anemia including bleeding from internal hemorrhoids as well as splenic sequestration.  Requiring multiple transfusions  Anemia panel with anemia of chronic disease and no specific deficiency. Protein electrophoresis pending. --monitor Hgb and transfuse to keep >7   Liver cirrhosis secondary to NASH (HCC) Recurrent ascites Acute hepatic encephalopathy Patient with history of decompensated liver cirrhosis and recurrent ascites.  S/p paracentesis with removal of 4 L of fluid. Patient with increased somnolence and ammonia level of 69-concerning of hepatic encephalopathy.  Started on lactulose enema as she was refusing to take p.o. worsening T. bili, recently has a procedure Apparently was only taking MiraLAX at home as needed. Plan: --cont oral lactulose now that pt is alert --cont torsemide and aldactone   Splenic sequestration S/P partial splenic embolization on 5/21 by IR. Worsening leukocytosis and T. bili -per IR it was anticipated. --started on Zosyn due to fever and worsening  leukocytosis --cont Zosyn for now   Thrombocytopenia [facility]) Patient has chronic pancytopenia.  Multifactorial with chronic disease, liver cirrhosis and splenic sequestration.     Internal hemorrhoid Apparently these have been ? bleeding requiring multiple transfusions for patient.  Monitoring clinically.     Chronic kidney disease, stage 3b (HCC) Appears to be at baseline. -Monitor renal function   Hypotension -Continue home midodrine   Depression with anxiety -Continue home sertraline   DVT prophylaxis: SCD/Compression stockings Code Status: Full code  Family Communication:  Level of care: Telemetry Medical Dispo:   The patient is from: home Anticipated d/c is to: home Anticipated d/c date is: undetermined   Subjective and Interval History:  Pt was somnolent again today during rounds   Objective: Vitals:   01/10/23 2011 01/11/23 0342 01/11/23 0733 01/11/23 1523  BP: (!) 110/49 (!) 107/47 (!) 119/45 (!) 130/58  Pulse: 73 67 74 83  Resp: 18 16 16 17   Temp: 98.1 F (36.7 C) 98.1 F (36.7 C) 98 F (36.7 C) 99.6 F (37.6 C)  TempSrc:      SpO2: 98% 96% 99% 100%  Weight:      Height:        Intake/Output Summary (Last 24 hours) at 01/11/2023 1927 Last data filed at 01/11/2023 0435 Gross per 24 hour  Intake 350 ml  Output --  Net 350 ml   Filed Weights   01/07/23 0013  Weight: 117.5 kg    Examination:   Constitutional: NAD, somnolent HEENT: conjunctivae and lids normal, EOMI CV: No cyanosis.   RESP: normal respiratory effort, on RA   Data Reviewed: I have personally reviewed labs and imaging studies  Time spent: 35 minutes  Darlin Priestly, MD [facility] If 7PM-7AM, please contact night-coverage 01/11/2023, 7:27  PM

## 2023-01-11 NOTE — Evaluation (Signed)
Physical Therapy Evaluation Patient Details Name: Rachel Huber MRN: [id number] DOB: May 28, 1956 Today's Date: 01/11/2023  History of Present Illness  Patient is a 67 year old female who presents with scheduled outpatient partial splenic embolization and paracentesis, found to have worsening anemia. Increased somnolence, concern for acute hepatic encephalopathy. History of NASH cirrhosis.  Clinical Impression  Patient with decreased level of alertness initially that improves with mobility. She reports living at home with her spouse with limited ambulation at baseline. She has DME in place. The patient was able to get out of bed with Min A for steadying during stand step transfer to bed side commode. She had a large volume of loose stool and required assistance for peri care. Activity tolerance is limited by fatigue. The patient is hopeful to return home with assistance from her spouse at discharge. Recommend to continue PT to maximize independence and decrease caregiver burden. Anticipate the need for frequent assistance at discharge with ongoing PT recommended after this hospital stay.      Recommendations for follow up therapy are one component of a multi-disciplinary discharge planning process, led by the attending physician.  Recommendations may be updated based on patient status, additional functional criteria and insurance authorization.  Follow Up Recommendations       Assistance Recommended at Discharge Frequent or constant Supervision/Assistance  Patient can return home with the following  A little help with walking and/or transfers;A little help with bathing/dressing/bathroom;Assistance with cooking/housework;Help with stairs or ramp for entrance;Assist for transportation    Equipment Recommendations None recommended by PT  Recommendations for Other Services       Functional Status Assessment Patient has had a recent decline in their functional status and demonstrates the ability  to make significant improvements in function in a reasonable and predictable amount of time.     Precautions / Restrictions Precautions Precautions: Fall Precaution Comments: wearing at post-op shoe on the left foot (she reports due to skin injury) Restrictions Weight Bearing Restrictions: No      Mobility  Bed Mobility Overal bed mobility: Needs Assistance Bed Mobility: Sit to Supine, Supine to Sit     Supine to sit: Min assist Sit to supine: Min assist   General bed mobility comments: assistance for LE support    Transfers Overall transfer level: Needs assistance Equipment used: None Transfers: Bed to chair/wheelchair/BSC     Step pivot transfers: Min assist       General transfer comment: steadying assistance provided with transfer to and from bed side commode. cues for technique and task initiation.    Ambulation/Gait             Pre-gait activities: patient able to take 2 small steps along edge of bed with hand held assistance. activity tolerance limited by fatigue General Gait Details: not attempted due to lethargy and fatigue with activity  Stairs            Wheelchair Mobility    Modified Rankin (Stroke Patients Only)       Balance                                             Pertinent Vitals/Pain Pain Assessment Pain Assessment: Faces Faces Pain Scale: Hurts a little bit Pain Location: low back and right thumb Pain Descriptors / Indicators: Discomfort Pain Intervention(s): Limited activity within patient's tolerance, Monitored during session, Repositioned  Home Living Family/patient expects to be discharged to:: Private residence Living Arrangements: Spouse/significant other Available Help at Discharge: Family;Available 24 hours/day Type of Home: House Home Access: Stairs to enter Entrance Stairs-Rails: None Entrance Stairs-Number of Steps: 2 small steps   Home Layout: One level Home Equipment: Clinical biochemist (2 wheels);Rollator (4 wheels);Cane - single point;Tub bench      Prior Function Prior Level of Function : Independent/Modified Independent             Mobility Comments: limited distance ambulation using 4 wheeled walker       Hand Dominance        Extremity/Trunk Assessment   Upper Extremity Assessment Upper Extremity Assessment: Generalized weakness    Lower Extremity Assessment Lower Extremity Assessment: Generalized weakness       Communication   Communication: Expressive difficulties (decreased level of alerntess during evaluation)  Cognition Arousal/Alertness: Awake/alert Behavior During Therapy: WFL for tasks assessed/performed Overall Cognitive Status: No family/caregiver present to determine baseline cognitive functioning                                 General Comments: decreased level of alertness initially. patient does have increased particiaption and level of alertness with mobility efforts and is much more alert/awake at the end. disoriented to time (thinks today is Saturday)        General Comments General comments (skin integrity, edema, etc.): patient had large volume stool at the bed side commode. assistance required for pericare    Exercises     Assessment/Plan    PT Assessment Patient needs continued PT services  PT Problem List Decreased strength;Decreased activity tolerance;Decreased balance;Decreased knowledge of use of DME;Decreased safety awareness       PT Treatment Interventions DME instruction;Gait training;Stair training;Functional mobility training;Therapeutic activities;Therapeutic exercise;Balance training;Neuromuscular re-education;Cognitive remediation;Patient/family education;Wheelchair mobility training    PT Goals (Current goals can be found in the Care Plan section)  Acute Rehab PT Goals Patient Stated Goal: to go home PT Goal Formulation: With patient Time For Goal Achievement: 01/25/23 Potential  to Achieve Goals: Fair    Frequency Min 3X/week     Co-evaluation               AM-PAC PT "6 Clicks" Mobility  Outcome Measure Help needed turning from your back to your side while in a flat bed without using bedrails?: A Little Help needed moving from lying on your back to sitting on the side of a flat bed without using bedrails?: A Little Help needed moving to and from a bed to a chair (including a wheelchair)?: A Little Help needed standing up from a chair using your arms (e.g., wheelchair or bedside chair)?: A Little Help needed to walk in hospital room?: A Lot Help needed climbing 3-5 steps with a railing? : A Lot 6 Click Score: 16    End of Session   Activity Tolerance: Patient limited by fatigue Patient left: in bed;with call bell/phone within reach;with bed alarm set Nurse Communication: Mobility status PT Visit Diagnosis: Unsteadiness on feet (R26.81);Muscle weakness (generalized) (M62.81)    Time: 1914-7829 PT Time Calculation (min) (ACUTE ONLY): 60 min   Charges:   PT Evaluation $PT Eval Low Complexity: 1 Low PT Treatments $Therapeutic Activity: 23-37 mins        Donna Bernard, PT, MPT   [name] 01/11/2023, 12:10 PM

## 2023-01-12 DIAGNOSIS — D649 Anemia, unspecified: Secondary | ICD-10-CM | POA: Diagnosis not present

## 2023-01-12 LAB — CBC
HCT: 21.4 % — ABNORMAL LOW (ref 36.0–46.0)
Hemoglobin: 6.8 g/dL — ABNORMAL LOW (ref 12.0–15.0)
MCH: 23.4 pg — ABNORMAL LOW (ref 26.0–34.0)
MCHC: 31.8 g/dL (ref 30.0–36.0)
MCV: 73.8 fL — ABNORMAL LOW (ref 80.0–100.0)
Platelets: 179 10*3/uL (ref 150–400)
RBC: 2.9 MIL/uL — ABNORMAL LOW (ref 3.87–5.11)
RDW: 20.9 % — ABNORMAL HIGH (ref 11.5–15.5)
WBC: 12.3 10*3/uL — ABNORMAL HIGH (ref 4.0–10.5)
nRBC: 0 % (ref 0.0–0.2)

## 2023-01-12 LAB — BASIC METABOLIC PANEL
Anion gap: 6 (ref 5–15)
BUN: 57 mg/dL — ABNORMAL HIGH (ref 8–23)
CO2: 21 mmol/L — ABNORMAL LOW (ref 22–32)
Calcium: 7.6 mg/dL — ABNORMAL LOW (ref 8.9–10.3)
Chloride: 107 mmol/L (ref 98–111)
Creatinine, Ser: 2 mg/dL — ABNORMAL HIGH (ref 0.44–1.00)
GFR, Estimated: 27 mL/min — ABNORMAL LOW (ref >60)
Glucose, Bld: 127 mg/dL — ABNORMAL HIGH (ref 70–99)
Potassium: 3.8 mmol/L (ref 3.5–5.1)
Sodium: 134 mmol/L — ABNORMAL LOW (ref 135–145)

## 2023-01-12 LAB — TYPE AND SCREEN

## 2023-01-12 LAB — CULTURE, BLOOD (ROUTINE X 2): Culture: NO GROWTH

## 2023-01-12 LAB — PREPARE RBC (CROSSMATCH)

## 2023-01-12 LAB — BPAM RBC: Blood Product Expiration Date: 202406112359

## 2023-01-12 LAB — MAGNESIUM: Magnesium: 1.8 mg/dL (ref 1.7–2.4)

## 2023-01-12 MED ORDER — AMOXICILLIN-POT CLAVULANATE 875-125 MG PO TABS: 1.0000 | ORAL_TABLET | Freq: Two times a day (BID) | ORAL | Status: DC

## 2023-01-12 MED ORDER — ALUM & MAG HYDROXIDE-SIMETH 200-200-20 MG/5ML PO SUSP
15.0000 mL | Freq: Four times a day (QID) | ORAL | Status: DC | PRN
  Administered 2023-01-12 – 2023-01-18 (×2): 15 mL via ORAL
  Filled 2023-01-12 (×2): qty 30

## 2023-01-12 MED ORDER — SODIUM CHLORIDE 0.9 % IV SOLN: INTRAVENOUS | Status: AC

## 2023-01-12 MED ORDER — SODIUM CHLORIDE 0.9% IV SOLUTION: Freq: Once | INTRAVENOUS | Status: AC

## 2023-01-12 MED ORDER — AMOXICILLIN-POT CLAVULANATE 875-125 MG PO TABS
1.0000 | ORAL_TABLET | Freq: Two times a day (BID) | ORAL | Status: AC
  Administered 2023-01-12 – 2023-01-15 (×6): 1 via ORAL
  Filled 2023-01-12 (×6): qty 1

## 2023-01-12 NOTE — Progress Notes (Signed)
PROGRESS NOTE    Rachel Huber  PPI:[id number] DOB: 1956/06/14 DOA: 01/06/2023 PCP: Lauro Regulus, MD  104A/104A-AA  LOS: 5 days   Brief hospital course:   Assessment & Plan: Rachel Huber is a 67 y.o. 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 Elby Showers of IR, and was found have worsening anemia.  Patient currently needing almost weekly transfusions.    * Anemia Has had chronic anemia actually pancytopenia and has been following up oncology.  There are multiple factors for anemia including bleeding from internal hemorrhoids as well as splenic sequestration.  Requiring multiple transfusions  Anemia panel with anemia of chronic disease and no specific deficiency. Protein electrophoresis pending. --1u pRBC today for Hgb 6.8   Liver cirrhosis secondary to NASH (HCC) Recurrent ascites Acute hepatic encephalopathy Patient with history of decompensated liver cirrhosis and recurrent ascites.  S/p paracentesis with removal of 4 L of fluid. Patient with increased somnolence and ammonia level of 69-concerning of hepatic encephalopathy.  Started on lactulose enema as she was refusing to take p.o. worsening T. bili, recently has a procedure Apparently was only taking MiraLAX at home as needed. Plan: --cont oral lactulose now that pt is alert --cont torsemide and aldactone --schedule weekly paracentesis for this wed   Splenic sequestration S/P partial splenic embolization on 5/21 by IR. Worsening leukocytosis and T. bili -per IR it was anticipated. --started on Zosyn due to  fever and worsening leukocytosis --transition to Augmentin today to finish 7-day course   Thrombocytopenia [facility]) Patient has chronic pancytopenia.  Multifactorial with chronic disease, liver cirrhosis and splenic sequestration.     Internal hemorrhoid Apparently these have been ? bleeding requiring multiple transfusions for patient.  Monitoring clinically.     Chronic kidney disease, stage 3b (HCC) Appears to be at baseline. -Monitor renal function   Hypotension -Continue home midodrine   Depression with anxiety -Continue home sertraline   DVT prophylaxis: SCD/Compression stockings Code Status: Full code  Family Communication: husband updated at bedside today Level of care: Telemetry Medical Dispo:   The patient is from: home Anticipated d/c is to: home Anticipated d/c date is: Wed or after   Subjective and Interval History:  Pt was alert and at baseline mental status this morning, ate breakfast with no N/V.   Objective: Vitals:   01/12/23 0423 01/12/23 1312 01/12/23 1351 01/12/23 1610  BP: (!) 109/41 (!) 110/43 (!) 121/46 (!) 130/54  Pulse: 83 68 70 78  Resp: 18 17 17 17   Temp: 99.2 F (37.3 C) 98 F (36.7 C) 99.1 F (37.3 C) 98.7 F (37.1 C)  TempSrc:  Oral Axillary Axillary  SpO2: 96% 98% 99% 98%  Weight:      Height:        Intake/Output Summary (Last 24 hours) at 01/12/2023 1809 Last data filed at 01/12/2023 1608 Gross per 24 hour  Intake 304 ml  Output 651 ml  Net -347 ml   Filed Weights   01/07/23 0013  Weight: 117.5 kg    Examination:   Constitutional: NAD, AAOx3 HEENT: conjunctivae and lids normal, EOMI CV: No cyanosis.   RESP: normal respiratory effort, on RA Neuro: II - XII grossly intact.  Psych: Normal mood and affect.  Appropriate judgement and reason   Data Reviewed: I have personally reviewed labs and imaging studies  Time spent: 35 minutes  Darlin Priestly, MD [facility] If 7PM-7AM, please contact  night-coverage 01/12/2023, 6:09 PM

## 2023-01-12 NOTE — Progress Notes (Addendum)
Inpatient Rehab Admissions Coordinator:  ° °Patient was screened for CIR candidacy by Rayley Gao, MS, CCC-SLP. At this time, Pt. Appears to be a a potential candidate for CIR. I will place  order for rehab consult per protocol for full assessment. Please contact me any with questions. ° °Landry Lookingbill, MS, CCC-SLP °Rehab Admissions Coordinator  °[phone] (celll) °[phone] (office) ° °

## 2023-01-12 NOTE — Care Management Important Message (Signed)
Important Message  Patient Details  Name: NOUF SIPE MRN: [id number] Date of Birth: [birth year]   Medicare Important Message Given:  Yes  Patient asleep upon time of visit, no family in room.  Copy of Medicare IM left in room for reference.    Johnell Comings 01/12/2023, 10:40 AM

## 2023-01-13 ENCOUNTER — Inpatient Hospital Stay: Payer: Medicare Other

## 2023-01-13 ENCOUNTER — Ambulatory Visit
Admission: RE | Admit: 2023-01-13 | Discharge: 2023-01-13 | Disposition: A | Discharge status: Home / Self Care | Payer: 59 | Source: Ambulatory Visit | Attending: Gastroenterology | Admitting: Gastroenterology

## 2023-01-13 DIAGNOSIS — D649 Anemia, unspecified: Secondary | ICD-10-CM | POA: Diagnosis not present

## 2023-01-13 LAB — CBC
HCT: 23.1 % — ABNORMAL LOW (ref 36.0–46.0)
Hemoglobin: 7.6 g/dL — ABNORMAL LOW (ref 12.0–15.0)
MCH: 24.4 pg — ABNORMAL LOW (ref 26.0–34.0)
MCHC: 32.9 g/dL (ref 30.0–36.0)
MCV: 74 fL — ABNORMAL LOW (ref 80.0–100.0)
Platelets: 218 10*3/uL (ref 150–400)
RBC: 3.12 MIL/uL — ABNORMAL LOW (ref 3.87–5.11)
RDW: 20.4 % — ABNORMAL HIGH (ref 11.5–15.5)
WBC: 11.5 10*3/uL — ABNORMAL HIGH (ref 4.0–10.5)
nRBC: 0 % (ref 0.0–0.2)

## 2023-01-13 LAB — CULTURE, BLOOD (ROUTINE X 2)
Culture: NO GROWTH
Special Requests: ADEQUATE

## 2023-01-13 LAB — BASIC METABOLIC PANEL
Anion gap: 6 (ref 5–15)
BUN: 55 mg/dL — ABNORMAL HIGH (ref 8–23)
CO2: 21 mmol/L — ABNORMAL LOW (ref 22–32)
Calcium: 7.7 mg/dL — ABNORMAL LOW (ref 8.9–10.3)
Chloride: 108 mmol/L (ref 98–111)
Creatinine, Ser: 1.73 mg/dL — ABNORMAL HIGH (ref 0.44–1.00)
GFR, Estimated: 32 mL/min — ABNORMAL LOW (ref >60)
Glucose, Bld: 150 mg/dL — ABNORMAL HIGH (ref 70–99)
Potassium: 3.4 mmol/L — ABNORMAL LOW (ref 3.5–5.1)
Sodium: 135 mmol/L (ref 135–145)

## 2023-01-13 LAB — TYPE AND SCREEN
ABO/RH(D): A POS
Unit division: 0

## 2023-01-13 LAB — MAGNESIUM: Magnesium: 1.7 mg/dL (ref 1.7–2.4)

## 2023-01-13 MED ORDER — POTASSIUM CHLORIDE CRYS ER 20 MEQ PO TBCR
40.0000 meq | EXTENDED_RELEASE_TABLET | Freq: Once | ORAL | Status: AC
  Administered 2023-01-13: 40 meq via ORAL
  Filled 2023-01-13: qty 2

## 2023-01-13 MED ORDER — SODIUM CHLORIDE 0.9 % IV SOLN: INTRAVENOUS | Status: AC

## 2023-01-13 NOTE — Progress Notes (Addendum)
PROGRESS NOTE    MIRAH SITTLER  ZOX:[id number] DOB: 12-Jan-1956 DOA: 01/06/2023 PCP: Lauro Regulus, MD  104A/104A-AA  LOS: 6 days   Brief hospital course:   Assessment & Plan: SHAURICE JOZWIAK is a 67 y.o. 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 Elby Showers of IR, and was found have worsening anemia.  Patient currently needing almost weekly transfusions.    * Anemia Has had chronic anemia actually pancytopenia and has been following up oncology.  There are multiple factors for anemia including bleeding from internal hemorrhoids as well as splenic sequestration.  Requiring multiple transfusions  Anemia panel with anemia of chronic disease and no specific deficiency. Protein electrophoresis pending. --1u pRBC for Hgb 6.8 --monitor Hgb   Liver cirrhosis secondary to NASH (HCC) Recurrent ascites Patient with history of decompensated liver cirrhosis and recurrent ascites.  Pt has been receiving weekly outpatient paracentesis for a while now.  Last paracentesis with removal of 4 L of fluid on 5/21. Plan: --cont torsemide and aldactone --schedule weekly paracentesis for this wed, per pt request  Acute encephalopathy and somnolence Acute hepatic encephalopathy --after presentation, pt with increased somnolence and ammonia level of 69-concerning of hepatic encephalopathy.  Started on lactulose enema as she was refusing to take p.o.   --mental status improved, however, still has periods of extreme sleepiness.  Lactulose switched to oral, however, pt now refuses to take them.  Pt and husband don't  believe pt needs the lactulose and believed her somnolence was due to the spleen embolization.   Splenic sequestration S/P partial splenic embolization on 5/21 by IR. Worsening leukocytosis and T. bili -per IR it was anticipated. --started on Zosyn due to fever and worsening leukocytosis, than transitioned to Augmentin after 4 days of zosyn and no fever. --cont Augmentin to finish 7-day course   AKI Chronic kidney disease, stage 3b (HCC) --Cr 1.43 on presentation.  Trended up gradually to 2.00, likely due to poor oral hydration.  Cr improved with gentle MIVF. --cont NS@75  for 12 more hours.  Thrombocytopenia (HCC) Patient has chronic pancytopenia.  Multifactorial with chronic disease, liver cirrhosis and splenic sequestration.     Internal hemorrhoid Apparently these have been ? bleeding requiring multiple transfusions for patient.  Monitoring clinically.     Hypotension -RN has been holding home midodrine since BP has been wnl --hold home midodrine   Depression with anxiety -Continue home sertraline   DVT prophylaxis: SCD/Compression stockings Code Status: Full code  Family Communication: husband updated at bedside today Level of care: Telemetry Medical Dispo:   The patient is from: home Anticipated d/c is to: CIR Anticipated d/c date is: whenever bed available   Subjective and Interval History:  Pt again was found to be very sleepy during rounds today.     Objective: Vitals:   01/13/23 0710 01/13/23 0735 01/13/23 1615 01/13/23 1741  BP: (!) 135/51 (!) 123/50 (!) 126/55 (!) 125/49  Pulse: 90  92 81  Resp: 20  17 16   Temp: 98.8 F (37.1 C)  98.6 F (37 C) 98.7 F (37.1 C)  TempSrc: Oral   Oral  SpO2: 95%  97% 97%  Weight:      Height:  Intake/Output Summary (Last 24 hours) at 01/13/2023 1945 Last data filed at 01/13/2023 1500 Gross per 24 hour  Intake 975.76 ml  Output 1775 ml  Net -799.24 ml   Filed Weights   01/07/23 0013  Weight: 117.5 kg     Examination:   Constitutional: NAD, sleeping, arousable, but couldn't stay awake CV: No cyanosis.   RESP: normal respiratory effort, on RA SKIN: warm, dry   Data Reviewed: I have personally reviewed labs and imaging studies  Time spent: 35 minutes  Darlin Priestly, MD [facility] If 7PM-7AM, please contact night-coverage 01/13/2023, 7:45 PM

## 2023-01-13 NOTE — Progress Notes (Addendum)
  Inpatient Rehabilitation Admissions Coordinator   I contacted patient by phone for rehab assessment of needs and review of her case. We discussed goals and expectations of a possible CIR admit. She would like to discuss with her husband first and then ask me to call her spouse after that discussion to discuss his preference if rehab is needed prior to discharge home.  Limited therapy evaluation yesterday. Please call me with any questions.   Ottie Glazier, RN, MSN Rehab Admissions Coordinator [phone]  I spoke with spouse by phone. We reviewed all goals and expectations of a CIR admit. He is in agreement. I will follow up in the am to clarify medical readiness to discharge to [facility] re[facility] on [place] [facility].  Ottie Glazier, RN, MSN Rehab Admissions Coordinator [phone] 01/13/2023 2:54 PM

## 2023-01-13 NOTE — Progress Notes (Signed)
Physical Therapy Treatment Patient Details Name: Rachel Huber MRN: [id number] DOB: Sep 22, 1955 Today's Date: 01/13/2023   History of Present Illness Patient is a 67 year old female who presents with scheduled outpatient partial splenic embolization and paracentesis, found to have worsening anemia. Increased somnolence, concern for acute hepatic encephalopathy. History of NASH cirrhosis.    PT Comments    Patient is agreeable to PT session. She is much more alert than previous session. Patient continues to require physical assistance with bed mobility and transfers. She was able to stand with bed elevated with moderate assistance for lift off and anterior weight shifting. Unable to progress ambulation this session due to poor standing tolerance and fatigue with activity. Spouse in the room states patient will be unable to return home until she is more independent with mobility as she was ambulatory with a walker prior to admission. At this time, anticipate patient will need frequent physical assistance with mobility and continued PT recommended after this hospital stay.    Recommendations for follow up therapy are one component of a multi-disciplinary discharge planning process, led by the attending physician.  Recommendations may be updated based on patient status, additional functional criteria and insurance authorization.  Follow Up Recommendations       Assistance Recommended at Discharge Frequent or constant Supervision/Assistance  Patient can return home with the following A little help with walking and/or transfers;A little help with bathing/dressing/bathroom;Assistance with cooking/housework;Help with stairs or ramp for entrance;Assist for transportation   Equipment Recommendations  None recommended by PT    Recommendations for Other Services       Precautions / Restrictions Precautions Precautions: Fall Precaution Comments: wearing at post-op shoe on the left foot (she reports  due to skin injury) Restrictions Weight Bearing Restrictions: No RUE Weight Bearing: Weight bearing as tolerated LUE Weight Bearing: Weight bearing as tolerated RLE Weight Bearing: Weight bearing as tolerated LLE Weight Bearing: Weight bearing as tolerated     Mobility  Bed Mobility Overal bed mobility: Needs Assistance Bed Mobility: Supine to Sit, Sit to Supine     Supine to sit: Mod assist Sit to supine: Mod assist   General bed mobility comments: assistance for LE and trunk support. cues for technique. increased time and effort required with all mobility    Transfers Overall transfer level: Needs assistance Equipment used: Rolling walker (2 wheels) Transfers: Sit to/from Stand Sit to Stand: Mod assist, From elevated surface           General transfer comment: assistance for lift off and anterior weight shifting required for standing. verbal cues for technique.    Ambulation/Gait               General Gait Details: unable to advance walking due to poor standing tolerance, fatigue with minimal activity   Stairs             Wheelchair Mobility    Modified Rankin (Stroke Patients Only)       Balance Overall balance assessment: Needs assistance Sitting-balance support: Feet supported Sitting balance-Leahy Scale: Poor Sitting balance - Comments: posterior lean initially that improved with increased sitting time with Min A required. progressing to supervision for safety Postural control: Posterior lean Standing balance support: Bilateral upper extremity supported, Reliant on assistive device for balance Standing balance-Leahy Scale: Poor Standing balance comment: standing tolerance of less than one minute. flexed posture with cues for standing upright with no carry over demonstrated  Cognition Arousal/Alertness: Awake/alert Behavior During Therapy: WFL for tasks assessed/performed Overall Cognitive Status:  Impaired/Different from baseline Area of Impairment: Problem solving, Following commands                       Following Commands: Follows one step commands consistently, Follows multi-step commands inconsistently     Problem Solving: Slow processing, Decreased initiation, Requires verbal cues, Difficulty sequencing, Requires tactile cues General Comments: much more alert and participatory than previous session        Exercises      General Comments General comments (skin integrity, edema, etc.): activity tolerance limited by fatigue. spoke with patient's spouse at the bedside who reports patient will need to be more independent with mobility before coming home      Pertinent Vitals/Pain Pain Assessment Pain Assessment: No/denies pain    Home Living                          Prior Function            PT Goals (current goals can now be found in the care plan section) Acute Rehab PT Goals Patient Stated Goal: to go home PT Goal Formulation: With patient Time For Goal Achievement: 01/25/23 Potential to Achieve Goals: Fair Progress towards PT goals: Progressing toward goals    Frequency    Min 3X/week      PT Plan Discharge plan needs to be updated    Co-evaluation              AM-PAC PT "6 Clicks" Mobility   Outcome Measure  Help needed turning from your back to your side while in a flat bed without using bedrails?: A Little Help needed moving from lying on your back to sitting on the side of a flat bed without using bedrails?: A Little Help needed moving to and from a bed to a chair (including a wheelchair)?: A Lot Help needed standing up from a chair using your arms (e.g., wheelchair or bedside chair)?: A Lot Help needed to walk in hospital room?: A Lot Help needed climbing 3-5 steps with a railing? : A Lot 6 Click Score: 14    End of Session   Activity Tolerance: Patient limited by fatigue Patient left: in bed;with call  bell/phone within reach;with bed alarm set;with family/visitor present Nurse Communication: Mobility status PT Visit Diagnosis: Unsteadiness on feet (R26.81);Muscle weakness (generalized) (M62.81)     Time: 8119-1478 PT Time Calculation (min) (ACUTE ONLY): 56 min  Charges:  $Therapeutic Activity: 53-67 mins                     Donna Bernard, PT, MPT   [name] 01/13/2023, 2:19 PM

## 2023-01-14 ENCOUNTER — Inpatient Hospital Stay: Payer: Medicare Other

## 2023-01-14 DIAGNOSIS — D649 Anemia, unspecified: Secondary | ICD-10-CM | POA: Diagnosis not present

## 2023-01-14 LAB — URINALYSIS, ROUTINE W REFLEX MICROSCOPIC
Bilirubin Urine: NEGATIVE
Glucose, UA: NEGATIVE mg/dL
Hgb urine dipstick: NEGATIVE
Ketones, ur: NEGATIVE mg/dL
Nitrite: NEGATIVE
Protein, ur: NEGATIVE mg/dL
Specific Gravity, Urine: 1.009 (ref 1.005–1.030)
pH: 5 (ref 5.0–8.0)

## 2023-01-14 LAB — CBC
HCT: 22.4 % — ABNORMAL LOW (ref 36.0–46.0)
Hemoglobin: 7.1 g/dL — ABNORMAL LOW (ref 12.0–15.0)
MCH: 23.8 pg — ABNORMAL LOW (ref 26.0–34.0)
MCHC: 31.7 g/dL (ref 30.0–36.0)
MCV: 75.2 fL — ABNORMAL LOW (ref 80.0–100.0)
Platelets: 247 10*3/uL (ref 150–400)
RBC: 2.98 MIL/uL — ABNORMAL LOW (ref 3.87–5.11)
RDW: 20.8 % — ABNORMAL HIGH (ref 11.5–15.5)
WBC: 9.3 10*3/uL (ref 4.0–10.5)
nRBC: 0 % (ref 0.0–0.2)

## 2023-01-14 LAB — BASIC METABOLIC PANEL
Anion gap: 6 (ref 5–15)
BUN: 52 mg/dL — ABNORMAL HIGH (ref 8–23)
CO2: 21 mmol/L — ABNORMAL LOW (ref 22–32)
Calcium: 7.5 mg/dL — ABNORMAL LOW (ref 8.9–10.3)
Chloride: 108 mmol/L (ref 98–111)
Creatinine, Ser: 1.71 mg/dL — ABNORMAL HIGH (ref 0.44–1.00)
GFR, Estimated: 33 mL/min — ABNORMAL LOW (ref >60)
Glucose, Bld: 150 mg/dL — ABNORMAL HIGH (ref 70–99)
Potassium: 3.7 mmol/L (ref 3.5–5.1)
Sodium: 135 mmol/L (ref 135–145)

## 2023-01-14 LAB — TYPE AND SCREEN

## 2023-01-14 LAB — PREPARE RBC (CROSSMATCH)

## 2023-01-14 LAB — HEMOGLOBIN AND HEMATOCRIT, BLOOD
HCT: 26.4 % — ABNORMAL LOW (ref 36.0–46.0)
Hemoglobin: 8.6 g/dL — ABNORMAL LOW (ref 12.0–15.0)

## 2023-01-14 LAB — MAGNESIUM: Magnesium: 1.6 mg/dL — ABNORMAL LOW (ref 1.7–2.4)

## 2023-01-14 LAB — BPAM RBC: Unit Type and Rh: 600

## 2023-01-14 MED ORDER — SODIUM CHLORIDE 0.9% IV SOLUTION: Freq: Once | INTRAVENOUS | Status: AC

## 2023-01-14 MED ORDER — SODIUM CHLORIDE 0.9 % IV BOLUS
1000.0000 mL | Freq: Once | INTRAVENOUS | Status: AC
  Administered 2023-01-15: 1000 mL via INTRAVENOUS

## 2023-01-14 MED ORDER — ALBUMIN HUMAN 25 % IV SOLN
25.0000 g | Freq: Once | INTRAVENOUS | Status: AC
  Administered 2023-01-14: 25 g via INTRAVENOUS
  Filled 2023-01-14: qty 100

## 2023-01-14 MED ORDER — LIDOCAINE HCL (PF) 1 % IJ SOLN
20.0000 mL | Freq: Once | INTRAMUSCULAR | Status: AC
  Administered 2023-01-14: 20 mL via INTRADERMAL

## 2023-01-14 MED ORDER — LIDOCAINE HCL (PF) 1 % IJ SOLN
10.0000 mL | Freq: Once | INTRAMUSCULAR | Status: DC
  Filled 2023-01-14: qty 10

## 2023-01-14 MED ORDER — DICLOFENAC SODIUM 1 % EX GEL
4.0000 g | Freq: Four times a day (QID) | CUTANEOUS | Status: DC
  Administered 2023-01-14 – 2023-01-19 (×18): 4 g via TOPICAL
  Filled 2023-01-14: qty 100

## 2023-01-14 NOTE — Progress Notes (Signed)
Referring Physician(s): Locklear,Cameron T  Supervising Physician: Oley Balm  Patient Status:  [facility] - In-pt  Chief Complaint: S/p partial splenic embolization in IR 5/21  Subjective: Patient seen for paracentesis today. She seems improved from last visit on 5/24, but still slightly disoriented  Allergies: Gramineae pollens and Latex  Medications: Prior to Admission medications   Medication Sig Start Date End Date Taking? Authorizing Provider  acetaminophen (TYLENOL) 650 MG CR tablet Take 1,300 mg by mouth daily as needed for pain.   Yes [provider]  albuterol (VENTOLIN HFA) 108 (90 Base) MCG/ACT inhaler Inhale 2 puffs into the lungs every 6 (six) hours as needed for wheezing or shortness of breath. 02/10/22  Yes Wieting, Richard, MD  chlorhexidine (PERIDEX) 0.12 % solution Use as directed 15 mLs in the mouth or throat 2 (two) times daily. 12/29/22  Yes [provider]  ciprofloxacin (CIPRO) 500 MG tablet Take 500 mg by mouth 2 (two) times daily.   Yes [provider]  diphenhydrAMINE (BENADRYL) 25 MG tablet Take 25 mg by mouth 2 (two) times daily as needed for itching.   Yes [provider]  ergocalciferol (VITAMIN D2) 1.25 MG (50000 UT) capsule Take 1 capsule (50,000 Units total) by mouth every 14 (fourteen) days. 09/01/22  Yes Pennington, Rebekah M, PA-C  gabapentin (NEURONTIN) 400 MG capsule Take 400 mg by mouth 2 (two) times daily. 01/07/18  Yes [provider]  HYDROcodone-acetaminophen (NORCO/VICODIN) 5-325 MG tablet Take 1 tablet by mouth every 6 (six) hours as needed for moderate pain. 12/29/22  Yes [provider]  melatonin 5 MG TABS Take 10 mg by mouth at bedtime as needed (sleep).   Yes [provider]  midodrine (PROAMATINE) 10 MG tablet Take 10 mg by mouth 3 (three) times daily.   Yes [provider]  Multiple Vitamins-Minerals (MULTIVITAMIN WITH MINERALS) tablet Take 1 tablet by mouth daily.    Yes [provider]  oxymetazoline (AFRIN) 0.05 % nasal spray Place 1 spray into both nostrils daily as needed for congestion.   Yes [provider]  polyethylene glycol (MIRALAX / GLYCOLAX) 17 g packet Take 17 g by mouth 2 (two) times daily. 06/20/22  Yes Enedina Finner, MD  sertraline (ZOLOFT) 50 MG tablet Take 50 mg by mouth daily. 12/12/17  Yes [provider]  spironolactone (ALDACTONE) 25 MG tablet Take 1 tablet (25 mg total) by mouth daily. Patient taking differently: Take 50 mg by mouth daily. 10/26/22  Yes Tat, Onalee Hua, MD  torsemide 40 MG TABS Take 40 mg by mouth daily. 10/26/22  Yes Tat, Onalee Hua, MD  atorvastatin (LIPITOR) 10 MG tablet Take 1 tablet (10 mg total) by mouth daily. Patient not taking: Reported on 01/07/2023 06/20/22   Enedina Finner, MD  cyanocobalamin (VITAMIN B12) 1000 MCG tablet Take 1 tablet (1,000 mcg total) by mouth daily. Patient not taking: Reported on 01/07/2023 12/17/22   Carnella Guadalajara, PA-C  fluconazole (DIFLUCAN) 150 MG tablet Take 1 tablet (150 mg total) by mouth daily. X one dose Patient not taking: Reported on 01/07/2023 12/20/22   Catarina Hartshorn, MD    Vital Signs: BP (!) 127/34   Pulse 88   Temp (!) 101.1 F (38.4 C) (Oral)   Resp 19   Ht 5\' 9"  (1.753 m)   Wt 259 lb (117.5 kg)   SpO2 97%   BMI 38.25 kg/m   Physical Exam Vitals reviewed.  Constitutional:      General: She is not in  acute distress.    Appearance: She is ill-appearing.  Pulmonary:     Effort: Pulmonary effort is normal.  Abdominal:     Palpations: Abdomen is soft.     Tenderness: There is no abdominal tenderness.  Musculoskeletal:     Right lower leg: Edema present.     Left lower leg: Edema present.  Skin:    General: Skin is warm and dry.  Neurological:     Mental Status: She is alert. She is disoriented.    General: Awake but pleasantly disoriented Abd: Soft, ND  Imaging: No results found.  Labs:  CBC: Recent Labs    01/11/23 0455  01/12/23 0545 01/13/23 0550 01/14/23 0515  WBC 13.0* 12.3* 11.5* 9.3  HGB 7.6* 6.8* 7.6* 7.1*  HCT 24.5* 21.4* 23.1* 22.4*  PLT 149* 179 218 247     COAGS: Recent Labs    09/18/22 1253 09/19/22 0359 11/03/22 0827 11/17/22 0114 01/06/23 1016 01/06/23 1427  INR 1.4*  --  1.4* 1.1 1.4*  --   APTT 35 36  --  33  --  34     BMP: Recent Labs    01/11/23 0455 01/12/23 0545 01/13/23 0550 01/14/23 0515  NA 137 134* 135 135  K 3.9 3.8 3.4* 3.7  CL 110 107 108 108  CO2 23 21* 21* 21*  GLUCOSE 128* 127* 150* 150*  BUN 56* 57* 55* 52*  CALCIUM 7.7* 7.6* 7.7* 7.5*  CREATININE 1.89* 2.00* 1.73* 1.71*  GFRNONAA 29* 27* 32* 33*     LIVER FUNCTION TESTS: Recent Labs    12/18/22 0523 01/08/23 0929 01/09/23 0441 01/10/23 0445  BILITOT 1.3* 4.1* 4.1* 3.1*  AST 33 62* 37 30  ALT 18 21 16 14   ALKPHOS 106 120 92 78  PROT 3.9* 5.3* 4.6* 4.5*  ALBUMIN 2.1* 2.6* 2.2* 1.9*     Assessment and Plan: This is 67 year old female known to our service with PMHx significant for 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 The patient presented as an outpatient 5/21 for partial splenic embolization to help improve her anemia and baseline Hgb was low therefore she was admitted post procedure.  Patient is s/p successful particle and coil embolization of inferior polar splenic artery resulting in 40-50% of total splenic volume and paracentesis removing 4L of ascites on 5/21. 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   Paracentesis removing 5L of ascites performed today  WBC improved to 9.3 today from 11.5 yesterday. Patient overall seems to be improving. Per her family, patient will  likely be transferred to inpatient rehab facility at [facility] in the coming days. Follow-up orders placed for visit with Dr Elby Showers in 1 month.   IR will continue to follow, please contact IR team with any questions or concerns.   Electronically Signed: Kennieth Francois, PA-C 01/14/2023, 4:00 PM   I spent a total of 15 Minutes at the the patient's bedside AND on the patient's hospital floor or unit, greater than 50% of which was counseling/coordinating care for partial splenic embolization.

## 2023-01-14 NOTE — Progress Notes (Signed)
       CROSS COVER NOTE  NAME: Rachel Huber MRN: [id number] DOB : 1956-04-24    HPI/Events of Note   Report: Fever of 103 Patient is s/p successful particle and coil embolization of inferior polar splenic artery resulting in 40-50% of total splenic volume and paracentesis removing 4L of ascites on 5/21. 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  Paracentesis removing 5L of ascites performed today. She also received 1 unit PRBC & Albumin 25 this afternoon. She had a low grade temp this am that has increased throughout the day at beginning and ending of transfusion she was 100.2. She did receive tylenol 650 mg at 1721. She currently has a temp of 103.1, rechecked as 102.7. Cool clothes & ice are applied.  WBC improved to 9.3 today from 11.5 yesterday. Hgb is now 8.6. Any further orders?   On review of chart: Complex history, reviewed including NASH cirrhosis s/p TIPS multifactorial pneumonia including ABLA from hemorrhoids s/p embolization s/p transfusion today , being treated for postembolization syndrome(leukocytosis and elevated total bilirubin) with Zosyn and now Augmentin who now develops high fever up to 103, not tachycardic or hypotensive.  She has no nausea, vomiting, abdominal pain   Assessment and  Interventions   Assessment:Fever of 103 Blood cultures most recently done on 5/23 Currently on Augmentin following transition from Zosyn Possibly related to blood transfusion given earlier Had paracentesis earlier  Plan: Will get repeat blood cultures as well as CBC, lactic acid, CMP  albeit patient otherwise asymptomatic except for generalized weakness X

## 2023-01-14 NOTE — Progress Notes (Signed)
Physical Therapy Treatment Patient Details Name: Rachel Huber MRN: [id number] DOB: Mar 14, 1956 Today's Date: 01/14/2023   History of Present Illness Patient is a 67 year old female who presents with scheduled outpatient partial splenic embolization and paracentesis, found to have worsening anemia. Increased somnolence, concern for acute hepatic encephalopathy. History of NASH cirrhosis.    PT Comments    Patient is agreeable to PT. She has been OOB with staff earlier today and declined getting back out of bed in preparation for paracentesis and blood transfusion. She participate with UE and LE therapeutic exercises for strengthening. Tips provided for techniques to increase independence with routine bed mobility. Recommend to continue PT to maximize independence and facilitate return to prior level of function.    Recommendations for follow up therapy are one component of a multi-disciplinary discharge planning process, led by the attending physician.  Recommendations may be updated based on patient status, additional functional criteria and insurance authorization.  Follow Up Recommendations       Assistance Recommended at Discharge Frequent or constant Supervision/Assistance  Patient can return home with the following A little help with walking and/or transfers;A little help with bathing/dressing/bathroom;Assistance with cooking/housework;Help with stairs or ramp for entrance;Assist for transportation   Equipment Recommendations  None recommended by PT    Recommendations for Other Services       Precautions / Restrictions Precautions Precautions: Fall Restrictions Weight Bearing Restrictions: No RUE Weight Bearing: Weight bearing as tolerated LUE Weight Bearing: Weight bearing as tolerated RLE Weight Bearing: Weight bearing as tolerated     Mobility  Bed Mobility Overal bed mobility: Needs Assistance Bed Mobility: Rolling Rolling: Max assist         General bed  mobility comments: cues for technique to facilitate independence for routine mobility with staff. patient is able to reach the right side bed rail with reaching across with LUE. right arm movement limited by prior shoulder injury and hand/wrist pain. patient declined getting out of bed due to fatigue (has already been up today) and awaiting paracentesis and blood transfusion    Transfers                        Ambulation/Gait                   Stairs             Wheelchair Mobility    Modified Rankin (Stroke Patients Only)       Balance                                            Cognition Arousal/Alertness: Awake/alert Behavior During Therapy: WFL for tasks assessed/performed Overall Cognitive Status: Impaired/Different from baseline Area of Impairment: Problem solving, Following commands                       Following Commands: Follows one step commands consistently, Follows multi-step commands inconsistently     Problem Solving: Slow processing, Decreased initiation, Requires verbal cues, Difficulty sequencing, Requires tactile cues          Exercises General Exercises - Upper Extremity Shoulder Flexion: AROM, Strengthening, Left, 10 reps, Supine Shoulder Horizontal ADduction: AROM, Strengthening, Left, 5 reps, Supine Elbow Flexion: AROM, Strengthening, Both, 10 reps, Supine (R limited by wrist/hand pain with movement) Elbow Extension: AROM, Both, 10 reps, Supine (  R limited by wrist/hand pain with movement) General Exercises - Lower Extremity Ankle Circles/Pumps: AROM, Strengthening, Both, 10 reps, Supine Short Arc Quad: AROM, Strengthening, Both, 10 reps, Supine Heel Slides: AROM, Strengthening, Both, 10 reps, Supine Hip ABduction/ADduction: AROM, Strengthening, Both, 10 reps, Supine Other Exercises Other Exercises: verbal and visual cues for exercise technique for strengthening    General Comments         Pertinent Vitals/Pain Pain Assessment Pain Assessment: Faces Faces Pain Scale: Hurts a little bit Pain Location: right hand, wrist Pain Descriptors / Indicators: Grimacing, Guarding Pain Intervention(s): Limited activity within patient's tolerance, Monitored during session, Repositioned    Home Living                          Prior Function            PT Goals (current goals can now be found in the care plan section) Acute Rehab PT Goals Patient Stated Goal: to go home PT Goal Formulation: With patient Time For Goal Achievement: 01/25/23 Potential to Achieve Goals: Fair Progress towards PT goals: Progressing toward goals    Frequency    Min 3X/week      PT Plan Current plan remains appropriate    Co-evaluation              AM-PAC PT "6 Clicks" Mobility   Outcome Measure  Help needed turning from your back to your side while in a flat bed without using bedrails?: A Lot Help needed moving from lying on your back to sitting on the side of a flat bed without using bedrails?: A Lot Help needed moving to and from a bed to a chair (including a wheelchair)?: A Lot Help needed standing up from a chair using your arms (e.g., wheelchair or bedside chair)?: A Lot Help needed to walk in hospital room?: A Lot Help needed climbing 3-5 steps with a railing? : A Lot 6 Click Score: 12    End of Session   Activity Tolerance: Patient limited by fatigue Patient left: in bed;with call bell/phone within reach;with bed alarm set Nurse Communication: Mobility status PT Visit Diagnosis: Unsteadiness on feet (R26.81);Muscle weakness (generalized) (M62.81)     Time: 1037-1100 PT Time Calculation (min) (ACUTE ONLY): 23 min  Charges:  $Therapeutic Exercise: 8-22 mins $Therapeutic Activity: 8-22 mins                     Donna Bernard, PT, MPT    Rachel Huber 01/14/2023, 12:40 PM

## 2023-01-14 NOTE — Progress Notes (Signed)
Inpatient Rehabilitation Admissions Coordinator   Notified by Dr Georgeann Oppenheim of planned blood transfusion today and paracentesis. I will follow up tomorrow to clarify medical readiness to admit to CIR at [facility] in [place].  Ottie Glazier, RN, MSN Rehab Admissions Coordinator [phone] 01/14/2023 11:45 AM

## 2023-01-14 NOTE — Procedures (Signed)
PROCEDURE SUMMARY:  Successful image-guided paracentesis from the right lower abdomen.  Yielded 5 liters of golden fluid.  No immediate complications.  EBL = trace. Patient tolerated well.   Specimen was not sent for labs.  Please see imaging section of Epic for full dictation.   Kennieth Francois PA-C 01/14/2023 1:22 PM

## 2023-01-14 NOTE — Progress Notes (Signed)
PROGRESS NOTE    Rachel Huber  ZOX:[id number] DOB: 05-Feb-1956 DOA: 01/06/2023 PCP: Lauro Regulus, MD    Brief Narrative:   67 y.o. 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 Elby Showers of IR, and was found have worsening anemia.  Patient currently needing almost weekly transfusions.    Assessment & Plan:   Principal Problem:   Anemia Active Problems:   Liver cirrhosis secondary to NASH (HCC)   Splenic sequestration   Thrombocytopenia (HCC)   Internal hemorrhoid   Chronic kidney disease, stage 3b (HCC)   Hypotension   Depression with anxiety  Acute on chronic anemia Pancytopenia History of thalassemia minor Has had chronic anemia actually pancytopenia and has been following up oncology.  There are multiple factors for anemia including bleeding from internal hemorrhoids as well as splenic sequestration.  Requiring multiple transfusions  Anemia panel with anemia of chronic disease and no specific deficiency. Plan: Transfuse 1 unit for hemoglobin 7.1   Liver cirrhosis secondary to NASH (HCC) Recurrent ascites Acute hepatic encephalopathy Patient with history of decompensated liver cirrhosis and recurrent ascites.  S/p paracentesis with removal of 4 L of fluid. Patient with increased somnolence and ammonia level of 69-concerning of hepatic encephalopathy.  Started on lactulose enema as she was refusing to take p.o. worsening T. bili, recently has a procedure Apparently was only taking MiraLAX at home as needed. Plan: Continue oral lactulose Continue torsemide and  Aldactone Paracentesis ordered for today   Splenic sequestration S/P partial splenic embolization on 5/21 by IR. Worsening leukocytosis and T. bili -per IR it was anticipated. --started on Zosyn due to fever and worsening leukocytosis -- Currently on Augmentin, last dose 5/29   Thrombocytopenia [facility]) Patient has chronic pancytopenia.  Multifactorial with chronic disease, liver cirrhosis and splenic sequestration.     Internal hemorrhoid Apparently these have been ? bleeding requiring multiple transfusions for patient.  Monitoring clinically.  Outpatient follow-up with GI.  Consider possible surgical referral for banding   Chronic kidney disease, stage 3b (HCC) Appears to be at baseline. -Monitor renal function   Hypotension -Continue home midodrine   Depression with anxiety -Continue home sertraline  DVT prophylaxis: SCDs Code Status: Full Family Communication: None Disposition Plan: Status is: Inpatient Remains inpatient appropriate because: Anemia requiring transfusion.  DC anticipated tomorrow   Level of care: Telemetry Medical  Consultants:  None  Procedures:  US paracentesis  Antimicrobials: None    Subjective: Seen and examined.  Resting comfortably in bed.  Reports being cold.  Objective: Vitals:   01/13/23 1741 01/13/23 1952 01/14/23 0458 01/14/23 0748  BP: (!) 125/49 (!) 123/47 (!) 116/51 (!) 125/57  Pulse: 81 87 78 88  Resp: 16 20 18 18   Temp: 98.7 F (37.1 C) 99.6 F (37.6 C) 98.4 F (36.9 C) 99.1 F (37.3 C)  TempSrc: Oral  Oral Oral  SpO2: 97% 97% 98% 98%  Weight:      Height:        Intake/Output Summary (Last 24 hours) at 01/14/2023 1137 Last data filed at 01/14/2023 0626 Gross per 24 hour  Intake --  Output 1370 ml  Net -1370 ml   Filed Weights   01/07/23 0013  Weight: 117.5 kg    Examination:  General exam: Appears calm and comfortable  Respiratory system: Lungs clear.  Normal work breathing.  Room air Cardiovascular  system: S1-S2 RRR, no murmurs, no pedal edema Gastrointestinal system: Soft, NT/ND, normal bowel sounds Central nervous system: Alert and oriented. No focal neurological deficits. Extremities: Symmetric 5 x 5 power. Skin: No rashes, lesions or ulcers Psychiatry: Judgement and insight appear normal. Mood & affect appropriate.     Data Reviewed: I have personally reviewed following labs and imaging studies  CBC: Recent Labs  Lab 01/10/23 0445 01/11/23 0455 01/12/23 0545 01/13/23 0550 01/14/23 0515  WBC 16.5* 13.0* 12.3* 11.5* 9.3  HGB 7.4* 7.6* 6.8* 7.6* 7.1*  HCT 23.3* 24.5* 21.4* 23.1* 22.4*  MCV 75.9* 75.4* 73.8* 74.0* 75.2*  PLT 135* 149* 179 218 247   Basic Metabolic Panel: Recent Labs  Lab 01/10/23 0445 01/11/23 0455 01/12/23 0545 01/13/23 0550 01/14/23 0515  NA 139 137 134* 135 135  K 4.1 3.9 3.8 3.4* 3.7  CL 111 110 107 108 108  CO2 22 23 21* 21* 21*  GLUCOSE 122* 128* 127* 150* 150*  BUN 56* 56* 57* 55* 52*  CREATININE 1.97* 1.89* 2.00* 1.73* 1.71*  CALCIUM 7.7* 7.7* 7.6* 7.7* 7.5*  MG 1.7 2.0 1.8 1.7 1.6*   GFR: Estimated Creatinine Clearance: 44.9 mL/min (A) (by C-G formula based on SCr of 1.71 mg/dL (H)). Liver Function Tests: Recent Labs  Lab 01/08/23 0929 01/09/23 0441 01/10/23 0445  AST 62* 37 30  ALT 21 16 14   ALKPHOS 120 92 78  BILITOT 4.1* 4.1* 3.1*  PROT 5.3* 4.6* 4.5*  ALBUMIN 2.6* 2.2* 1.9*   No results for input(s): "LIPASE", "AMYLASE" in the last 168 hours. Recent Labs  Lab 01/09/23 0441  AMMONIA 65*   Coagulation Profile: No results for input(s): "INR", "PROTIME" in the last 168 hours. Cardiac Enzymes: No results for input(s): "CKTOTAL", "CKMB", "CKMBINDEX", "TROPONINI" in the last 168 hours. BNP (last 3 results) No results for input(s): "PROBNP" in the last 8760 hours. HbA1C: No results for input(s): "HGBA1C" in the last 72 hours. CBG: No results for input(s): "GLUCAP" in the last 168 hours. Lipid Profile: No results  for input(s): "CHOL", "HDL", "LDLCALC", "TRIG", "CHOLHDL", "LDLDIRECT" in the last 72 hours. Thyroid Function Tests: No results for input(s): "TSH", "T4TOTAL", "FREET4", "T3FREE", "THYROIDAB" in the last 72 hours. Anemia Panel: No results for input(s): "VITAMINB12", "FOLATE", "FERRITIN", "TIBC", "IRON", "RETICCTPCT" in the last 72 hours. Sepsis Labs: No results for input(s): "PROCALCITON", "LATICACIDVEN" in the last 168 hours.  Recent Results (from the past 240 hour(s))  Culture, blood (Routine X 2) w Reflex to ID Panel     Status: None   Collection Time: 01/08/23  3:47 PM   Specimen: BLOOD  Result Value Ref Range Status   Specimen Description BLOOD LEFT ANTECUBITAL  Final   Special Requests NONE  Final   Culture   Final    NO GROWTH 5 DAYS Performed at [facility], [address] [place], [place] [address]    Report Status 01/13/2023 FINAL  Final  Culture, blood (Routine X 2) w Reflex to ID Panel     Status: None   Collection Time: 01/08/23  3:53 PM   Specimen: BLOOD  Result Value Ref Range Status   Specimen Description BLOOD BLOOD RIGHT WRIST  Final   Special Requests   Final    BOTTLES DRAWN AEROBIC AND ANAEROBIC Blood Culture adequate volume   Culture  Final    NO GROWTH 5 DAYS Performed at [facility], [address], [place], [place] [address]    Report Status 01/13/2023 FINAL  Final         Radiology Studies: No results found.      Scheduled Meds:  sodium chloride   Intravenous Once   amoxicillin-clavulanate  1 tablet Oral Q12H   atorvastatin  10 mg Oral Daily   Chlorhexidine Gluconate Cloth  6 each Topical Daily   cyanocobalamin  1,000 mcg Oral Daily   diclofenac Sodium  4 g Topical QID   gabapentin  400 mg Oral BID   lactulose  20 g Oral TID   sertraline  50 mg Oral Daily   sodium chloride flush  3 mL Intravenous Q12H   spironolactone  50 mg Oral Daily   torsemide  40 mg Oral Daily   Continuous Infusions:   LOS: 7 days        Tresa Moore, MD [facility]   If 7PM-7AM, please contact night-coverage  01/14/2023, 11:37 AM

## 2023-01-15 DIAGNOSIS — D649 Anemia, unspecified: Secondary | ICD-10-CM | POA: Diagnosis not present

## 2023-01-15 LAB — CBC WITH DIFFERENTIAL/PLATELET
Abs Immature Granulocytes: 0.05 10*3/uL (ref 0.00–0.07)
Basophils Absolute: 0 10*3/uL (ref 0.0–0.1)
Basophils Relative: 0 %
Eosinophils Absolute: 0.1 10*3/uL (ref 0.0–0.5)
Eosinophils Relative: 1 %
HCT: 26.5 % — ABNORMAL LOW (ref 36.0–46.0)
Hemoglobin: 8.6 g/dL — ABNORMAL LOW (ref 12.0–15.0)
Immature Granulocytes: 1 %
Lymphocytes Relative: 9 %
Lymphs Abs: 0.7 10*3/uL (ref 0.7–4.0)
MCH: 24.4 pg — ABNORMAL LOW (ref 26.0–34.0)
MCHC: 32.5 g/dL (ref 30.0–36.0)
MCV: 75.1 fL — ABNORMAL LOW (ref 80.0–100.0)
Monocytes Absolute: 1.5 10*3/uL — ABNORMAL HIGH (ref 0.1–1.0)
Monocytes Relative: 19 %
Neutro Abs: 5.4 10*3/uL (ref 1.7–7.7)
Neutrophils Relative %: 70 %
Platelets: 276 10*3/uL (ref 150–400)
RBC: 3.53 MIL/uL — ABNORMAL LOW (ref 3.87–5.11)
RDW: 19.8 % — ABNORMAL HIGH (ref 11.5–15.5)
WBC: 7.7 10*3/uL (ref 4.0–10.5)
nRBC: 0 % (ref 0.0–0.2)

## 2023-01-15 LAB — APTT: aPTT: 34 seconds (ref 24–36)

## 2023-01-15 LAB — TYPE AND SCREEN
Antibody Screen: NEGATIVE
Unit division: 0

## 2023-01-15 LAB — BPAM RBC
Blood Product Expiration Date: 202406192359
ISSUE DATE / TIME: 202405271329
ISSUE DATE / TIME: 202405291420
Unit Type and Rh: 5100

## 2023-01-15 LAB — COMPREHENSIVE METABOLIC PANEL
ALT: 20 U/L (ref 0–44)
AST: 36 U/L (ref 15–41)
Albumin: 2.2 g/dL — ABNORMAL LOW (ref 3.5–5.0)
Alkaline Phosphatase: 116 U/L (ref 38–126)
Anion gap: 5 (ref 5–15)
BUN: 47 mg/dL — ABNORMAL HIGH (ref 8–23)
CO2: 22 mmol/L (ref 22–32)
Calcium: 7.6 mg/dL — ABNORMAL LOW (ref 8.9–10.3)
Chloride: 105 mmol/L (ref 98–111)
Creatinine, Ser: 1.6 mg/dL — ABNORMAL HIGH (ref 0.44–1.00)
GFR, Estimated: 36 mL/min — ABNORMAL LOW (ref >60)
Glucose, Bld: 208 mg/dL — ABNORMAL HIGH (ref 70–99)
Potassium: 3.5 mmol/L (ref 3.5–5.1)
Sodium: 132 mmol/L — ABNORMAL LOW (ref 135–145)
Total Bilirubin: 2.2 mg/dL — ABNORMAL HIGH (ref 0.3–1.2)
Total Protein: 4.8 g/dL — ABNORMAL LOW (ref 6.5–8.1)

## 2023-01-15 LAB — PROCALCITONIN: Procalcitonin: 1.51 ng/mL

## 2023-01-15 LAB — PROTIME-INR
INR: 1.4 — ABNORMAL HIGH (ref 0.8–1.2)
Prothrombin Time: 17.4 seconds — ABNORMAL HIGH (ref 11.4–15.2)

## 2023-01-15 LAB — LACTIC ACID, PLASMA
Lactic Acid, Venous: 1.2 mmol/L (ref 0.5–1.9)
Lactic Acid, Venous: 1.9 mmol/L (ref 0.5–1.9)

## 2023-01-15 LAB — CULTURE, BLOOD (ROUTINE X 2): Special Requests: ADEQUATE

## 2023-01-15 LAB — HEMOGLOBIN: Hemoglobin: 8.6 g/dL — ABNORMAL LOW (ref 12.0–15.0)

## 2023-01-15 MED ORDER — AMOXICILLIN-POT CLAVULANATE 875-125 MG PO TABS
1.0000 | ORAL_TABLET | Freq: Two times a day (BID) | ORAL | Status: DC
  Administered 2023-01-15 – 2023-01-17 (×4): 1 via ORAL
  Filled 2023-01-15 (×4): qty 1

## 2023-01-15 NOTE — TOC Progression Note (Signed)
Transition of Care [facility]) - Progression Note    Patient Details  Name: Rachel Huber MRN: [id number] Date of Birth: Mar 26, 1956  Transition of Care [facility]) CM/SW Contact  Allena Katz, LCSW Phone Number: 01/15/2023, 9:27 AM  Clinical Narrative:     Pt fevering and is unable to discharge at this time. TOC following.   Expected Discharge Plan: Home w Home Health Services    Expected Discharge Plan and Services                                   HH Arranged: RN           Social Determinants of Health (SDOH) Interventions SDOH Screenings   Food Insecurity: Patient Declined (01/06/2023)  Housing: Patient Declined (01/06/2023)  Transportation Needs: Patient Declined (01/06/2023)  Utilities: Patient Declined (01/06/2023)  Alcohol Screen: Low Risk  (04/04/2022)  Depression (PHQ2-9): Low Risk  (04/04/2022)  Financial Resource Strain: Low Risk  (04/04/2022)  Physical Activity: Inactive (04/04/2022)  Social Connections: Moderately Isolated (04/04/2022)  Stress: No Stress Concern Present (04/04/2022)  Tobacco Use: Low Risk  (01/09/2023)    Readmission Risk Interventions    12/19/2022   10:27 AM 11/04/2022   11:32 AM 10/17/2022    9:17 AM  Readmission Risk Prevention Plan  Transportation Screening Complete Complete Complete  Medication Review Oceanographer) Complete Complete Complete  PCP or Specialist appointment within 3-5 days of discharge Complete Complete   HRI or Home Care Consult Complete  Complete  SW Recovery Care/Counseling Consult Complete Complete Complete  Palliative Care Screening Not Applicable Not Applicable Not Applicable  Skilled Nursing Facility Not Applicable Not Applicable Not Applicable

## 2023-01-15 NOTE — Evaluation (Signed)
Occupational Therapy Evaluation Patient Details Name: Rachel Huber MRN: [id number] DOB: 03/21/1956 Today's Date: 01/15/2023   History of Present Illness Patient is a 67 year old female who presents with scheduled outpatient partial splenic embolization and paracentesis, found to have worsening anemia. Increased somnolence, concern for acute hepatic encephalopathy. History of NASH cirrhosis.   Clinical Impression   Patient presenting with decreased Ind in self care,balance, functional mobility/transfers, endurance, and safety awareness. Patient reports being mod I at baseline with use of AD for mobility and some occasional assistance with self care from husband. Pt feeling unwell this morning and was running fever recently. She declines OOB and EOB activities but demonstrates rolling L <> R with max A and use of bed rails. Pt observed to be feeding herself and managing medications from pill cup with some difficulty but no spillage noted this session. Pt appears far from baseline.  Patient will benefit from acute OT to increase overall independence in the areas of ADLs, functional mobility, and safety awareness in order to safely discharge.     Recommendations for follow up therapy are one component of a multi-disciplinary discharge planning process, led by the attending physician.  Recommendations may be updated based on patient status, additional functional criteria and insurance authorization.   Assistance Recommended at Discharge Intermittent Supervision/Assistance  Patient can return home with the following A lot of help with bathing/dressing/bathroom;A lot of help with walking and/or transfers;Assistance with cooking/housework;Assist for transportation;Help with stairs or ramp for entrance    Functional Status Assessment  Patient has had a recent decline in their functional status and demonstrates the ability to make significant improvements in function in a reasonable and predictable amount  of time.  Equipment Recommendations  Other (comment) (defer to next venue of care)       Precautions / Restrictions Precautions Precautions: Fall Precaution Comments: wearing at post-op shoe on the left foot (she reports due to skin injury) Restrictions Weight Bearing Restrictions: No      Mobility Bed Mobility Overal bed mobility: Needs Assistance Bed Mobility: Rolling Rolling: Max assist              Transfers                   General transfer comment: Pt declined          ADL either performed or assessed with clinical judgement   ADL Overall ADL's : Needs assistance/impaired Eating/Feeding: Set up   Grooming: Set up                                 General ADL Comments: Pt declines OOB activity this session. OT anticipates pt needing mod A for LB self care tasks and set up A - min A for UB.     Vision Baseline Vision/History: 1 Wears glasses Patient Visual Report: No change from baseline              Pertinent Vitals/Pain Pain Assessment Pain Assessment: No/denies pain     Hand Dominance Right   Extremity/Trunk Assessment Upper Extremity Assessment Upper Extremity Assessment: Generalized weakness   Lower Extremity Assessment Lower Extremity Assessment: Generalized weakness       Communication Communication Communication: No difficulties   Cognition Arousal/Alertness: Awake/alert Behavior During Therapy: WFL for tasks assessed/performed Overall Cognitive Status: Impaired/Different from baseline Area of Impairment: Problem solving, Following commands, Safety/judgement  Following Commands: Follows one step commands consistently, Follows multi-step commands inconsistently Safety/Judgement: Decreased awareness of safety   Problem Solving: Slow processing, Decreased initiation, Requires verbal cues, Difficulty sequencing, Requires tactile cues                  Home Living  Family/patient expects to be discharged to:: Private residence Living Arrangements: Spouse/significant other Available Help at Discharge: Family;Available 24 hours/day Type of Home: House Home Access: Stairs to enter [employer] of Steps: 2 small steps Entrance Stairs-Rails: None Home Layout: One level     Bathroom Shower/Tub: Chief Strategy Officer: Standard     Home Equipment: Agricultural consultant (2 wheels);Rollator (4 wheels);Cane - single point;Tub bench;Hand held shower head      Lives With: Spouse    Prior Functioning/Environment Prior Level of Function : Needs assist             Mobility Comments: limited distance ambulation using 4 wheeled walker ADLs Comments: Pt endorses husband assisting with shower for safety and reports things have been getting more difficult recently        OT Problem List: Decreased strength;Decreased activity tolerance;Decreased safety awareness;Impaired balance (sitting and/or standing);Decreased knowledge of use of DME or AE      OT Treatment/Interventions: Self-care/ADL training;Therapeutic exercise;Therapeutic activities;DME and/or AE instruction;Patient/family education;Manual therapy;Balance training    OT Goals(Current goals can be found in the care plan section) Acute Rehab OT Goals Patient Stated Goal: to get stronger and return to PLOF OT Goal Formulation: With patient Time For Goal Achievement: 01/29/23 Potential to Achieve Goals: Fair ADL Goals Pt Will Perform Grooming: with supervision;standing Pt Will Perform Lower Body Dressing: with min guard assist;sit to/from stand Pt Will Transfer to Toilet: with min guard assist;ambulating Pt Will Perform Toileting - Clothing Manipulation and hygiene: with min guard assist;sit to/from stand  OT Frequency: Min 2X/week       AM-PAC OT "6 Clicks" Daily Activity     Outcome Measure Help from another person eating meals?: None Help from another person taking care  of personal grooming?: A Little Help from another person toileting, which includes using toliet, bedpan, or urinal?: A Lot Help from another person bathing (including washing, rinsing, drying)?: A Lot Help from another person to put on and taking off regular upper body clothing?: A Little Help from another person to put on and taking off regular lower body clothing?: A Lot 6 Click Score: 16   End of Session Nurse Communication: Mobility status  Activity Tolerance: Patient tolerated treatment well Patient left: in bed;with bed alarm set;with call bell/phone within reach  OT Visit Diagnosis: Unsteadiness on feet (R26.81);Muscle weakness (generalized) (M62.81)                Time: 1610-9604 OT Time Calculation (min): 13 min Charges:  OT General Charges $OT Visit: 1 Visit OT Evaluation $OT Eval Moderate Complexity: [address], [place], OTR/L , CBIS ascom [phone]  01/15/23, 11:58 AM

## 2023-01-15 NOTE — Progress Notes (Signed)
Physical Therapy Treatment Patient Details Name: Rachel Huber MRN: [id number] DOB: 1955-11-18 Today's Date: 01/15/2023   History of Present Illness Patient is a 67 year old female who presents with scheduled outpatient partial splenic embolization and paracentesis, found to have worsening anemia. Increased somnolence, concern for acute hepatic encephalopathy. History of NASH cirrhosis.   PT Comments    Patient was agreeable to PT and is progressing towards meeting goals. Patient continues to require assistance with bed mobility with fair sitting balance. Standing performed x 2 bouts with moderate assistance using rolling walker. She was able to take 2 small steps to the right with rolling walker and Min A. Activity tolerance limited by fatigue. Recommend to continue PT to maximize independence and decrease caregiver burden. Anticipate the need for frequent supervision/assistance at discharge.     Recommendations for follow up therapy are one component of a multi-disciplinary discharge planning process, led by the attending physician.  Recommendations may be updated based on patient status, additional functional criteria and insurance authorization.  Follow Up Recommendations       Assistance Recommended at Discharge Frequent or constant Supervision/Assistance  Patient can return home with the following A little help with walking and/or transfers;A little help with bathing/dressing/bathroom;Assistance with cooking/housework;Help with stairs or ramp for entrance;Assist for transportation   Equipment Recommendations  None recommended by PT    Recommendations for Other Services       Precautions / Restrictions Precautions Precautions: Fall Precaution Comments: post-op shoe on the left for reported skin injury Restrictions Weight Bearing Restrictions: No     Mobility  Bed Mobility Overal bed mobility: Needs Assistance Bed Mobility: Rolling Rolling: Min assist, Mod assist (Min A  rolling to R, Mod A rolling to L)   Supine to sit: Mod assist Sit to supine: Mod assist   General bed mobility comments: assistance for BLE and trunk support. increased time and effort required    Transfers Overall transfer level: Needs assistance Equipment used: Rolling walker (2 wheels) Transfers: Sit to/from Stand Sit to Stand: Mod assist, From elevated surface           General transfer comment: lifting and lowering assistance provided. cues for hand placement with increased time and effort required with all activity. 2 bouts of standing performed. post-op shoe on the left foot, closed shoe on the right    Ambulation/Gait Ambulation/Gait assistance: Min assist Gait Distance (Feet): 1 Feet Assistive device: Rolling walker (2 wheels)   Gait velocity: decreased     General Gait Details: cues for upright posture. patient able to take 2 small side steps to the right before needing to sit due to urination   Stairs             Wheelchair Mobility    Modified Rankin (Stroke Patients Only)       Balance Overall balance assessment: Needs assistance Sitting-balance support: Feet supported Sitting balance-Leahy Scale: Fair     Standing balance support: Bilateral upper extremity supported Standing balance-Leahy Scale: Poor Standing balance comment: external support required with rolling walker for support in standing                            Cognition Arousal/Alertness: Awake/alert Behavior During Therapy: WFL for tasks assessed/performed Overall Cognitive Status: Impaired/Different from baseline Area of Impairment: Problem solving, Following commands, Safety/judgement  Following Commands: Follows one step commands consistently, Follows multi-step commands inconsistently Safety/Judgement: Decreased awareness of safety   Problem Solving: Slow processing, Decreased initiation, Requires verbal cues, Difficulty  sequencing, Requires tactile cues          Exercises      General Comments        Pertinent Vitals/Pain Pain Assessment Pain Assessment: No/denies pain    Home Living Family/patient expects to be discharged to:: Private residence Living Arrangements: Spouse/significant other Available Help at Discharge: Family;Available 24 hours/day Type of Home: House Home Access: Stairs to enter Entrance Stairs-Rails: None Entrance Stairs-Number of Steps: 2 small steps   Home Layout: One level Home Equipment: Agricultural consultant (2 wheels);Rollator (4 wheels);Cane - single point;Tub bench;Hand held shower head      Prior Function            PT Goals (current goals can now be found in the care plan section) Acute Rehab PT Goals Patient Stated Goal: to get streonger, be able to walk PT Goal Formulation: With patient Time For Goal Achievement: 01/25/23 Potential to Achieve Goals: Fair Progress towards PT goals: Progressing toward goals    Frequency    Min 3X/week      PT Plan Current plan remains appropriate    Co-evaluation              AM-PAC PT "6 Clicks" Mobility   Outcome Measure  Help needed turning from your back to your side while in a flat bed without using bedrails?: A Lot Help needed moving from lying on your back to sitting on the side of a flat bed without using bedrails?: A Lot Help needed moving to and from a bed to a chair (including a wheelchair)?: A Lot Help needed standing up from a chair using your arms (e.g., wheelchair or bedside chair)?: A Lot Help needed to walk in hospital room?: A Lot Help needed climbing 3-5 steps with a railing? : A Lot 6 Click Score: 12    End of Session   Activity Tolerance: Patient limited by fatigue Patient left: in bed;with call bell/phone within reach;with bed alarm set Nurse Communication: Mobility status (discussed with nurse tech) PT Visit Diagnosis: Unsteadiness on feet (R26.81);Muscle weakness (generalized)  (M62.81)     Time: 1610-9604 PT Time Calculation (min) (ACUTE ONLY): 48 min  Charges:  $Therapeutic Activity: 38-52 mins                    Donna Bernard, PT, MPT  [name] 01/15/2023, 3:06 PM

## 2023-01-15 NOTE — Progress Notes (Signed)
PROGRESS NOTE    Rachel Huber  ZOX:[id number] DOB: Jun 03, 1956 DOA: 01/06/2023 PCP: Lauro Regulus, MD    Brief Narrative:   67 y.o. 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 Elby Showers of IR, and was found have worsening anemia.  Patient currently needing almost weekly transfusions.   Patient started developing fevers post blood transfusion yesterday.  Unclear etiology.  Questionable blood transfusion reaction.  Infectious workup in progress.   Assessment & Plan:   Principal Problem:   Anemia Active Problems:   Liver cirrhosis secondary to NASH [facility])   Splenic sequestration   Thrombocytopenia (HCC)   Internal hemorrhoid   Chronic kidney disease, stage 3b (HCC)   Hypotension   Depression with anxiety  New onset fever Noted on 5/29 after blood transfusion.  Unclear etiology.  Infectious workup in progress. Procalcitonin elevated to 1.5 Urine not obviously infected Plan: Follow cultures Monitor vitals and fever curve Restart Augmentin  Acute on chronic anemia Pancytopenia History of thalassemia minor Has had chronic anemia actually pancytopenia and has been following up oncology.  There are multiple factors for anemia including bleeding from internal hemorrhoids as well as splenic sequestration.  Requiring multiple transfusions  Anemia panel with anemia of chronic disease and no specific deficiency. Plan: Hold transfusion for now.  Hemoglobin 8.6 this morning   Liver cirrhosis secondary to NASH (HCC) Recurrent ascites Acute hepatic encephalopathy Patient  with history of decompensated liver cirrhosis and recurrent ascites.  S/p paracentesis with removal of 4 L of fluid. Patient with increased somnolence and ammonia level of 69-concerning of hepatic encephalopathy.  Started on lactulose enema as she was refusing to take p.o. worsening T. bili, recently has a procedure Apparently was only taking MiraLAX at home as needed. Paracentesis done on 5/30.  5 L off Plan: Continue oral lactulose Continue torsemide and Aldactone   Splenic sequestration S/P partial splenic embolization on 5/21 by IR. Worsening leukocytosis and T. bili -per IR it was anticipated. --started on Zosyn due to fever and worsening leukocytosis -- Currently on Augmentin, last dose 5/29   Thrombocytopenia [facility]) Patient has chronic pancytopenia.  Multifactorial with chronic disease, liver cirrhosis and splenic sequestration.     Internal hemorrhoid Apparently these have been ? bleeding requiring multiple transfusions for patient.  Monitoring clinically.  Outpatient follow-up with GI.  Consider possible surgical referral for banding   Chronic kidney disease, stage 3b (HCC) Appears to be at baseline. -Monitor renal function   Hypotension -Continue home midodrine   Depression with anxiety -Continue home sertraline  DVT prophylaxis: SCDs Code Status: Full Family Communication: Spouse Yiran Shaban [phone] on 5/30 Disposition Plan: Status is: Inpatient Remains inpatient appropriate because: Anemia requiring transfusion.  DC anticipated tomorrow   Level of care: Telemetry Medical  Consultants:  None  Procedures:  US paracentesis  Antimicrobials: None    Subjective: Seen and examined.  Sleeping this morning.  Easily arousable  Objective: Vitals:   01/15/23 0105 01/15/23 0335 01/15/23 0451 01/15/23 0724  BP:  (!) 111/42 (!) 117/44 (!) 113/43  Pulse:  88 85 85  Resp:  19 18 18   Temp: 99.3 F (37.4 C) (!) 102.1 F (38.9 C) (!) 101.6 F (38.7  C) (!)  101.3 F (38.5 C)  TempSrc: Oral Oral  Oral  SpO2:  95% 96% 94%  Weight:      Height:        Intake/Output Summary (Last 24 hours) at 01/15/2023 1053 Last data filed at 01/15/2023 0600 Gross per 24 hour  Intake 909.19 ml  Output --  Net 909.19 ml   Filed Weights   01/07/23 0013  Weight: 117.5 kg    Examination:  General exam: NAD Respiratory system: Bibasilar crackles.  Normal work of breathing.  Room air Cardiovascular system: S1-S2 RRR, no murmurs, no pedal edema Gastrointestinal system: Soft, NT/ND, normal bowel sounds Central nervous system: Alert and oriented. No focal neurological deficits. Extremities: Symmetric 5 x 5 power. Skin: No rashes, lesions or ulcers Psychiatry: Judgement and insight appear normal. Mood & affect appropriate.     Data Reviewed: I have personally reviewed following labs and imaging studies  CBC: Recent Labs  Lab 01/11/23 0455 01/12/23 0545 01/13/23 0550 01/14/23 0515 01/14/23 1757 01/15/23 0007  WBC 13.0* 12.3* 11.5* 9.3  --  7.7  NEUTROABS  --   --   --   --   --  5.4  HGB 7.6* 6.8* 7.6* 7.1* 8.6* 8.6*  HCT 24.5* 21.4* 23.1* 22.4* 26.4* 26.5*  MCV 75.4* 73.8* 74.0* 75.2*  --  75.1*  PLT 149* 179 218 247  --  276   Basic Metabolic Panel: Recent Labs  Lab 01/10/23 0445 01/11/23 0455 01/12/23 0545 01/13/23 0550 01/14/23 0515 01/15/23 0007  NA 139 137 134* 135 135 132*  K 4.1 3.9 3.8 3.4* 3.7 3.5  CL 111 110 107 108 108 105  CO2 22 23 21* 21* 21* 22  GLUCOSE 122* 128* 127* 150* 150* 208*  BUN 56* 56* 57* 55* 52* 47*  CREATININE 1.97* 1.89* 2.00* 1.73* 1.71* 1.60*  CALCIUM 7.7* 7.7* 7.6* 7.7* 7.5* 7.6*  MG 1.7 2.0 1.8 1.7 1.6*  --    GFR: Estimated Creatinine Clearance: 48 mL/min (A) (by C-G formula based on SCr of 1.6 mg/dL (H)). Liver Function Tests: Recent Labs  Lab 01/09/23 0441 01/10/23 0445 01/15/23 0007  AST 37 30 36  ALT 16 14 20   ALKPHOS 92 78 116  BILITOT 4.1* 3.1* 2.2*  PROT 4.6* 4.5* 4.8*  ALBUMIN  2.2* 1.9* 2.2*   No results for input(s): "LIPASE", "AMYLASE" in the last 168 hours. Recent Labs  Lab 01/09/23 0441  AMMONIA 65*   Coagulation Profile: Recent Labs  Lab 01/15/23 0007  INR 1.4*   Cardiac Enzymes: No results for input(s): "CKTOTAL", "CKMB", "CKMBINDEX", "TROPONINI" in the last 168 hours. BNP (last 3 results) No results for input(s): "PROBNP" in the last 8760 hours. HbA1C: No results for input(s): "HGBA1C" in the last 72 hours. CBG: No results for input(s): "GLUCAP" in the last 168 hours. Lipid Profile: No results for input(s): "CHOL", "HDL", "LDLCALC", "TRIG", "CHOLHDL", "LDLDIRECT" in the last 72 hours. Thyroid Function Tests: No results for input(s): "TSH", "T4TOTAL", "FREET4", "T3FREE", "THYROIDAB" in the last 72 hours. Anemia Panel: No results for input(s): "VITAMINB12", "FOLATE", "FERRITIN", "TIBC", "IRON", "RETICCTPCT" in the last 72 hours. Sepsis Labs: Recent Labs  Lab 01/15/23 0007 01/15/23 0400  PROCALCITON 1.51  --   LATICACIDVEN 1.9 1.2    Recent Results (from the past 240 hour(s))  Culture, blood (Routine X 2) w Reflex to ID Panel     Status: None   Collection Time: 01/08/23  3:47 PM   Specimen: BLOOD  Result Value Ref  Range Status   Specimen Description BLOOD LEFT ANTECUBITAL  Final   Special Requests NONE  Final   Culture   Final    NO GROWTH 5 DAYS Performed at [facility], [address] [place], [place] [address]    Report Status 01/13/2023 FINAL  Final  Culture, blood (Routine X 2) w Reflex to ID Panel     Status: None   Collection Time: 01/08/23  3:53 PM   Specimen: BLOOD  Result Value Ref Range Status   Specimen Description BLOOD BLOOD RIGHT WRIST  Final   Special Requests   Final    BOTTLES DRAWN AEROBIC AND ANAEROBIC Blood Culture adequate volume   Culture   Final    NO GROWTH 5 DAYS Performed at [facility], [address], [place], [place] [address]    Report Status 01/13/2023 FINAL  Final   Culture, blood (x 2)     Status: None (Preliminary result)   Collection Time: 01/15/23 12:07 AM   Specimen: BLOOD  Result Value Ref Range Status   Specimen Description BLOOD BLOOD LEFT HAND  Final   Special Requests   Final    BOTTLES DRAWN AEROBIC AND ANAEROBIC Blood Culture adequate volume   Culture   Final    NO GROWTH < 12 HOURS Performed at [facility], [address], [place], [place] [address]    Report Status PENDING  Incomplete  Culture, blood (Routine X 2) w Reflex to ID Panel     Status: None (Preliminary result)   Collection Time: 01/15/23  1:05 AM   Specimen: BLOOD  Result Value Ref Range Status   Specimen Description BLOOD RIGHT HAND  Final   Special Requests   Final    BOTTLES DRAWN AEROBIC AND ANAEROBIC Blood Culture adequate volume   Culture   Final    NO GROWTH < 12 HOURS Performed at [facility], [address] [place], [place] [address]    Report Status PENDING  Incomplete         Radiology Studies: DG ABD ACUTE 2+V W 1V CHEST  Result Date: 01/15/2023 CLINICAL DATA:  Paracentesis, fever EXAM: DG ABDOMEN ACUTE WITH 1 VIEW CHEST COMPARISON:  01/08/2023 FINDINGS: Right Port-A-Cath remains in place, unchanged. Cardiomegaly. No confluent airspace opacities or effusions. Nonobstructive bowel gas pattern. No organomegaly, free air or suspicious calcification. IMPRESSION: No evidence of bowel obstruction or free air. Cardiomegaly.  No acute cardiopulmonary disease. Electronically Signed   By: Charlett Nose M.D.   On: 01/15/2023 03:50        Scheduled Meds:  amoxicillin-clavulanate  1 tablet Oral Q12H   atorvastatin  10 mg Oral Daily   Chlorhexidine Gluconate Cloth  6 each Topical Daily   cyanocobalamin  1,000 mcg Oral Daily   diclofenac Sodium  4 g Topical QID   gabapentin  400 mg Oral BID   lactulose  20 g Oral TID   lidocaine (PF)  10 mL Subcutaneous Once   sertraline  50 mg Oral Daily   sodium chloride flush  3 mL Intravenous  Q12H   spironolactone  50 mg Oral Daily   torsemide  40 mg Oral Daily   Continuous Infusions:   LOS: 8 days       Tresa Moore, MD [facility]   If 7PM-7AM, please contact night-coverage  01/15/2023, 10:53 AM

## 2023-01-16 ENCOUNTER — Inpatient Hospital Stay: Payer: Medicare Other

## 2023-01-16 DIAGNOSIS — R188 Other ascites: Secondary | ICD-10-CM | POA: Diagnosis not present

## 2023-01-16 DIAGNOSIS — R5381 Other malaise: Secondary | ICD-10-CM

## 2023-01-16 DIAGNOSIS — N189 Chronic kidney disease, unspecified: Secondary | ICD-10-CM

## 2023-01-16 DIAGNOSIS — K766 Portal hypertension: Secondary | ICD-10-CM

## 2023-01-16 DIAGNOSIS — R509 Fever, unspecified: Secondary | ICD-10-CM

## 2023-01-16 DIAGNOSIS — D649 Anemia, unspecified: Secondary | ICD-10-CM | POA: Diagnosis not present

## 2023-01-16 DIAGNOSIS — K7581 Nonalcoholic steatohepatitis (NASH): Secondary | ICD-10-CM | POA: Diagnosis not present

## 2023-01-16 DIAGNOSIS — D500 Iron deficiency anemia secondary to blood loss (chronic): Secondary | ICD-10-CM

## 2023-01-16 LAB — CBC WITH DIFFERENTIAL/PLATELET
Abs Immature Granulocytes: 0.04 10*3/uL (ref 0.00–0.07)
Basophils Absolute: 0 10*3/uL (ref 0.0–0.1)
Basophils Relative: 1 %
Eosinophils Absolute: 0.1 10*3/uL (ref 0.0–0.5)
Eosinophils Relative: 1 %
HCT: 27 % — ABNORMAL LOW (ref 36.0–46.0)
Hemoglobin: 8.7 g/dL — ABNORMAL LOW (ref 12.0–15.0)
Immature Granulocytes: 1 %
Lymphocytes Relative: 9 %
Lymphs Abs: 0.7 10*3/uL (ref 0.7–4.0)
MCH: 23.8 pg — ABNORMAL LOW (ref 26.0–34.0)
MCHC: 32.2 g/dL (ref 30.0–36.0)
MCV: 74 fL — ABNORMAL LOW (ref 80.0–100.0)
Monocytes Absolute: 1.1 10*3/uL — ABNORMAL HIGH (ref 0.1–1.0)
Monocytes Relative: 14 %
Neutro Abs: 6.1 10*3/uL (ref 1.7–7.7)
Neutrophils Relative %: 74 %
Platelets: 289 10*3/uL (ref 150–400)
RBC: 3.65 MIL/uL — ABNORMAL LOW (ref 3.87–5.11)
RDW: 20 % — ABNORMAL HIGH (ref 11.5–15.5)
WBC: 8.1 10*3/uL (ref 4.0–10.5)
nRBC: 0 % (ref 0.0–0.2)

## 2023-01-16 LAB — BASIC METABOLIC PANEL
Anion gap: 6 (ref 5–15)
BUN: 41 mg/dL — ABNORMAL HIGH (ref 8–23)
CO2: 21 mmol/L — ABNORMAL LOW (ref 22–32)
Calcium: 7.5 mg/dL — ABNORMAL LOW (ref 8.9–10.3)
Chloride: 106 mmol/L (ref 98–111)
Creatinine, Ser: 1.41 mg/dL — ABNORMAL HIGH (ref 0.44–1.00)
GFR, Estimated: 41 mL/min — ABNORMAL LOW (ref >60)
Glucose, Bld: 147 mg/dL — ABNORMAL HIGH (ref 70–99)
Potassium: 3.4 mmol/L — ABNORMAL LOW (ref 3.5–5.1)
Sodium: 133 mmol/L — ABNORMAL LOW (ref 135–145)

## 2023-01-16 LAB — HEPATIC FUNCTION PANEL
ALT: 23 U/L (ref 0–44)
AST: 41 U/L (ref 15–41)
Albumin: 1.8 g/dL — ABNORMAL LOW (ref 3.5–5.0)
Alkaline Phosphatase: 105 U/L (ref 38–126)
Bilirubin, Direct: 0.6 mg/dL — ABNORMAL HIGH (ref 0.0–0.2)
Indirect Bilirubin: 0.7 mg/dL (ref 0.3–0.9)
Total Bilirubin: 1.3 mg/dL — ABNORMAL HIGH (ref 0.3–1.2)
Total Protein: 4.3 g/dL — ABNORMAL LOW (ref 6.5–8.1)

## 2023-01-16 LAB — PROCALCITONIN: Procalcitonin: 0.98 ng/mL

## 2023-01-16 MED ORDER — POTASSIUM CHLORIDE CRYS ER 20 MEQ PO TBCR
20.0000 meq | EXTENDED_RELEASE_TABLET | Freq: Once | ORAL | Status: AC
  Administered 2023-01-16: 20 meq via ORAL
  Filled 2023-01-16: qty 1

## 2023-01-16 MED ORDER — IOHEXOL 9 MG/ML PO SOLN
500.0000 mL | ORAL | Status: AC
  Administered 2023-01-16: 500 mL via ORAL

## 2023-01-16 NOTE — PMR Pre-admission (Signed)
PMR Admission Coordinator Pre-Admission Assessment  Patient: Rachel Huber is an 67 y.o., female MRN: [id number] DOB: May 14, 1956 Height: 5\' 9"  (175.3 cm) Weight: 117.5 kg              Insurance Information HMO:     PPO:      PCP:      IPA:      80/20:      OTHER:  PRIMARY: Medicare a and b      Policy#: [id number]      Subscriber: pt Benefits:  Phone #: passport one source     Name: 5/28 Eff. Date: 11/17/2022 Deduct: $1632      Out of Pocket Max: none      Life Max: none CIR: 100%      SNF: 20 full days Outpatient: 80%     Co-Pay: 20% Home Health: 100%      Co-Pay: none DME: 80%     Co-Pay: 20% Providers: pt choice  SECONDARY: [employer]      Policy#: [id number]  Financial Counselor:       Phone#:   The "Data Collection Information Summary" for patients in Inpatient Rehabilitation Facilities with attached "Privacy Act Statement-Health Care Records" was provided and verbally reviewed with: Patient and Family  Emergency Contact Information Contact Information     Name Relation Home Work Mobile   [place] G Spouse [phone]  [phone]   Allen,Pat Sister [phone]        Current Medical History  Patient Admitting Diagnosis: Debility  History of Present Illness:    Rachel Huber is a 67 year old right-handed female with complicated medical history including type 2 diabetes mellitus, hypertension, recurrent grade 4 internal hemorrhoids with associated anemia/pancytopenia followed by oncology services requiring frequent transfusions, OSA on CPAP,  hyperlipidemia, chronic orthostasis maintained on ProAmatine, CKD stage III as well as 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  Patient is working with [facility] to complete liver transplant workup.  Patient with recent admission 12/17/2022 - 12/20/2022 for recurrent symptomatic anemia and did receive 4 units packed red  blood cells.   Per chart review patient lives with spouse.  1 level home 2 steps to entry.  Ambulates with a 4 wheeled walker and limited endurance.  Presented to [facility] 01/06/2023 for planned partial splenic embolization and paracentesis with 4 L yield 01/06/2023 per Dr. Elby Showers.     Hospital course was found to have worsening anemia 5.5 and transfused.  Noted to have increased somnolence and ammonia level of 69 concerning for hepatic encephalopathy.  She was started on lactulose enema as well as maintained on torsemide and Aldactone..  Patient developed leukocytosis 19,800 with low-grade fever and blood cultures no growth to date and was started on Zosyn transitioned to Augmentin completing course 5/29.  Her hemoglobin has stabilized at 8.6.  History of AKI latest creatinine 1.60.  She has required ongoing paracentesis as needed with latest completed 01/14/2023 yielding 5 L of golden fluid.  Her latest ammonia level on 05/24 was 65 and she continues on scheduled lactulose.  Tolerating a regular diet.  Therapy evaluations completed due to patient decreased functional mobility was admitted for a comprehensive rehab program.  Patient's medical record from [facility] has been reviewed by the rehabilitation admission coordinator and physician.  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  Yes  Family History  family history includes Breast cancer (age of onset: 18) in her mother; Breast cancer (age of onset: 63) in her sister; Diabetes in her father and sister; Heart disease in her father; Kidney cancer in her father; Lymphoma in her mother.   Current Medications   Current Facility-Administered Medications:    acetaminophen (TYLENOL) tablet 650 mg, 650 mg, Oral, Q6H PRN, 650 mg at 01/15/23 1933 **OR** acetaminophen (TYLENOL) suppository 650 mg, 650 mg, Rectal, Q6H PRN, Nolberto Hanlon, MD   albuterol (PROVENTIL) (2.5 MG/3ML) 0.083% nebulizer solution 3 mL, 3 mL, Inhalation, Q6H PRN, Arnetha Courser, MD   alum & mag hydroxide-simeth (MAALOX/MYLANTA) 200-200-20 MG/5ML suspension 15 mL, 15 mL, Oral, Q6H PRN, Darlin Priestly, MD, 15 mL at 01/12/23 2002   amoxicillin-clavulanate (AUGMENTIN) 875-125 MG per tablet 1 tablet, 1 tablet, Oral, Q12H, Sreenath, Sudheer B, MD, 1 tablet at 01/16/23 0544   atorvastatin (LIPITOR) tablet 10 mg, 10 mg, Oral, Daily, Nolberto Hanlon, MD, 10 mg at 01/16/23 0818   Chlorhexidine Gluconate Cloth 2 % PADS 6 each, 6 each, Topical, Daily, Arnetha Courser, MD, 6 each at 01/16/23 1610   cyanocobalamin (VITAMIN B12) tablet 1,000 mcg, 1,000 mcg, Oral, Daily, Arnetha Courser, MD, 1,000 mcg at 01/16/23 0818   diclofenac Sodium (VOLTAREN) 1 % topical gel 4 g, 4 g, Topical, QID, Sreenath, Sudheer B, MD, 4 g at 01/16/23 1341   gabapentin (NEURONTIN) capsule 400 mg, 400 mg, Oral, BID, Arnetha Courser, MD, 400 mg at 01/16/23 0818   lactulose (CHRONULAC) 10 GM/15ML solution 20 g, 20 g, Oral, TID, Arnetha Courser, MD, 20 g at 01/11/23 2051   lidocaine (PF) (XYLOCAINE) 1 % injection 10 mL, 10 mL, Subcutaneous, Once, Mina Marble L, PA   ondansetron [facility]) injection 4 mg, 4 mg, Intravenous, Q6H PRN, Darlin Priestly, MD, 4 mg at 01/10/23 1517   ondansetron (ZOFRAN-ODT) disintegrating tablet 4 mg, 4 mg, Oral, Q8H PRN, Darlin Priestly, MD, 4 mg at 01/12/23 2002   sertraline (ZOLOFT) tablet 50 mg, 50 mg,  Oral, Daily, Amin, Tilman Neat, MD, 50 mg at 01/16/23 0818   sodium chloride flush (NS) 0.9 % injection 3 mL, 3 mL, Intravenous, Q12H, Nolberto Hanlon, MD, 3 mL at 01/15/23 2143   spironolactone (ALDACTONE) tablet 50 mg, 50 mg, Oral, Daily, Arnetha Courser, MD, 50 mg at 01/16/23 0818   torsemide (DEMADEX) tablet 40 mg, 40 mg, Oral, Daily, Arnetha Courser, MD, 40 mg at 01/16/23 9604  Facility-Administered Medications Ordered in Other Encounters:    diphenhydrAMINE (BENADRYL) capsule 25 mg, 25 mg, Oral, Once, Pennington, Rebekah M, PA-C  Patients Current Diet:  Diet Order             Diet regular Room service appropriate? Yes; Fluid consistency: Thin  Diet effective now  Precautions / Restrictions Precautions Precautions: Fall Precaution Comments: post-op shoe on the left for reported skin injury Restrictions Weight Bearing Restrictions: No RUE Weight Bearing: Weight bearing as tolerated LUE Weight Bearing: Weight bearing as tolerated RLE Weight Bearing: Weight bearing as tolerated LLE Weight Bearing: Weight bearing as tolerated   Has the patient had 2 or more falls or a fall with injury in the past year?No  Prior Activity Level Limited Community (1-2x/wk): Mod I with RW; assisted with adls  Prior Functional Level Prior Function Prior Level of Function : Needs assist Mobility Comments: limited distance ambulation using 4 wheeled walker ADLs Comments: Pt endorses husband assisting with shower for safety and reports things have been getting more difficult recently  Self Care: Did the patient need help bathing, dressing, using the toilet or eating?  Needed some help  Indoor Mobility: Did the patient need assistance with walking from room to room (with or without device)? Independent  Stairs: Did the patient need assistance with internal or external stairs (with or without device)? Independent  Functional Cognition: Did the patient need help planning regular tasks  such as shopping or remembering to take medications? Independent  Patient Information Are you of Hispanic, Latino/a,or Spanish origin?: A. No, not of Hispanic, Latino/a, or Spanish origin What is your race?: A. White Do you need or want an interpreter to communicate with a doctor or health care staff?: 0. No  Patient's Response To:  Health Literacy and Transportation Is the patient able to respond to health literacy and transportation needs?: Yes Health Literacy - How often do you need to have someone help you when you read instructions, pamphlets, or other written material from your doctor or pharmacy?: Never In the past 12 months, has lack of transportation kept you from medical appointments or from getting medications?: No In the past 12 months, has lack of transportation kept you from meetings, work, or from getting things needed for daily living?: No  Home Assistive Devices / Equipment Home Assistive Devices/Equipment: Environmental consultant (specify type) Home Equipment: Agricultural consultant (2 wheels), Rollator (4 wheels), Cane - single point, Tub bench, Hand held shower head  Prior Device Use: Indicate devices/aids used by the patient prior to current illness, exacerbation or injury? Walker  Current Functional Level Cognition  Overall Cognitive Status: Impaired/Different from baseline Orientation Level: Oriented X4 Following Commands: Follows one step commands consistently, Follows multi-step commands inconsistently Safety/Judgement: Decreased awareness of safety General Comments: much more alert and participatory than previous session    Extremity Assessment (includes Sensation/Coordination)  Upper Extremity Assessment: Generalized weakness  Lower Extremity Assessment: Generalized weakness    ADLs  Overall ADL's : Needs assistance/impaired Eating/Feeding: Set up Grooming: Set up General ADL Comments: Pt declines OOB activity this session. OT anticipates pt needing mod A for LB self care tasks  and set up A - min A for UB.    Mobility  Overal bed mobility: Needs Assistance Bed Mobility: Rolling Rolling: Min assist, Min guard Supine to sit: Mod assist Sit to supine: Mod assist General bed mobility comments: Min guard for rolling to the right and Min A for rolling to the left. cues for technique to increase independence by using bed rail to facilitate turns. patient declined getting out of bed due to needing to drink contrast for her upcoming CT scan. encouraged chair position in bed and/or getting out of bed later today with staff assistance to promote upright conditioning    Transfers  Overall transfer level: Needs assistance Equipment used: Rolling  walker (2 wheels) Transfers: Sit to/from Stand Sit to Stand: Mod assist, From elevated surface Bed to/from chair/wheelchair/BSC transfer type:: Step pivot Step pivot transfers: Min assist General transfer comment: lifting and lowering assistance provided. cues for hand placement with increased time and effort required with all activity. 2 bouts of standing performed. post-op shoe on the left foot, closed shoe on the right    Ambulation / Gait / Stairs / Wheelchair Mobility  Ambulation/Gait Ambulation/Gait assistance: Editor, commissioning (Feet): 1 Feet Assistive device: Rolling walker (2 wheels) General Gait Details: cues for upright posture. patient able to take 2 small side steps to the right before needing to sit due to urination Gait velocity: decreased Pre-gait activities: patient able to take 2 small steps along edge of bed with hand held assistance. activity tolerance limited by fatigue    Posture / Balance Dynamic Sitting Balance Sitting balance - Comments: posterior lean initially that improved with increased sitting time with Min A required. progressing to supervision for safety Balance Overall balance assessment: Needs assistance Sitting-balance support: Feet supported Sitting balance-Leahy Scale: Fair Sitting  balance - Comments: posterior lean initially that improved with increased sitting time with Min A required. progressing to supervision for safety Postural control: Posterior lean Standing balance support: Bilateral upper extremity supported Standing balance-Leahy Scale: Poor Standing balance comment: external support required with rolling walker for support in standing    Special needs/care consideration     Signed      Show:Clear all [x] Written[x] Templated[] Copied  Added by: [x] McNichol, Bonney Aid, RN  [] Hover for details WOC Nurse Consult Note: Reason for Consult:Left foot ulceration, left hallux, distal tip, seen and followed by Dr. Excell Seltzer (Podiatric Medicine) in the community. Last seen in Dr. Bernette Redbird office on 01/05/23. We will continue the POC in place by Dr. Excell Seltzer. Wound type:Neuropathic, diabetic Pressure Injury POA: N/A Measurement:0.3cm round x 0.1cm per Nursing flow sheet Wound ZOX:WRUE, pale Drainage (amount, consistency, odor) scant serosanguinous Periwound:intact Dressing procedure/placement/frequency: Daily wound care is for cleanse with NS,k dry and apply bacitracin ointment, top with dry gauze and secure with Coban self adhering wrap and apply a buttress. In house, we will secure with Kerlix roll gauze/paper tape. Coban and buttre4ss can resume upon discharge with husband assisting with home wound care. He has been instructed in this per Dr. Bernette Redbird last note.   If further direction or evaluation is desired, recommend consulting Dr. Excell Seltzer.   WOC nursing team will not follow, but will remain available to this patient, the nursing and medical teams.  Please re-consult if needed.   Thank you for inviting [place] to participate in this patient's Plan of Care.   Ladona Mow, MSN, RN, CNS, GNP, CWOCN, CWON-AP, WOCNF, [employer] phone:  [phone]                 Weekly paracentesis on Wednesdays and weekly blood transfusions     Previous Home Environment   Living Arrangements: Spouse/significant other  Lives With: Spouse Available Help at Discharge: Family, Available 24 hours/day Type of Home: House Home Layout: One level Home Access: Stairs to enter Entrance Stairs-Rails: None Entrance Stairs-Number of Steps: 2 small steps Bathroom Shower/Tub: Engineer, manufacturing systems: Standard Bathroom Accessibility: Yes How Accessible: Accessible via walker Home Care Services: Other (Comment) Additional Comments: 5/22 at [facility] with [facility] arranged  Discharge Living Setting Plans for Discharge Living Setting: Patient's home, Lives with (comment) (spouse) Type of Home at Discharge: House Discharge Home Layout: One level Discharge  Home Access: Stairs to enter Entrance Stairs-Rails: None Entrance Stairs-Number of Steps: 2 small steps Discharge Bathroom Shower/Tub: Tub/shower unit Discharge Bathroom Toilet: Standard Discharge Bathroom Accessibility: Yes How Accessible: Accessible via walker Does the patient have any problems obtaining your medications?: No  Social/Family/Support Systems Patient Roles: Spouse Contact Information: spouse, [name] Anticipated Caregiver: spouse Anticipated Industrial/product designer Information: see contacts Ability/Limitations of Caregiver: no limitations Caregiver Availability: 24/7 Discharge Plan Discussed with Primary Caregiver: Yes Is Caregiver In Agreement with Plan?: Yes Does Caregiver/Family have Issues with Lodging/Transportation while Pt is in Rehab?: No  Goals Patient/Family Goal for Rehab: supervision with PT and OT Expected length of stay: ELOS 10 to 12 days Pt/Family Agrees to Admission and willing to participate: Yes Program Orientation Provided & Reviewed with Pt/Caregiver Including Roles  & Responsibilities: Yes  Decrease burden of Care through IP rehab admission: n/a  Possible need for SNF placement upon discharge:not anticipated  Patient Condition: This patient's medical and  functional status has changed since the consult dated: 01/16/23 in which the Rehabilitation Physician determined and documented that the patient's condition is appropriate for intensive rehabilitative care in an inpatient rehabilitation facility. See "History of Present Illness" (above) for medical update. Functional changes are: Pt. Mod A with transfers. Patient's medical and functional status update has been discussed with the Rehabilitation physician and patient remains appropriate for inpatient rehabilitation. Will admit to inpatient rehab today.  Preadmission Screen Completed By:  Clois Dupes, RN MSN 01/16/2023 4:51 PM ______________________________________________________________________   Discussed status with Dr. Wynn Banker on 01/19/23 at 10:00 and received approval for admission today.  Admission Coordinator:  Clois Dupes RN MSN time 10:00 Dorna Bloom [phone]

## 2023-01-16 NOTE — Progress Notes (Signed)
Physical Therapy Treatment Patient Details Name: Rachel Huber MRN: [id number] DOB: March 16, 1956 Today's Date: 01/16/2023   History of Present Illness Patient is a 67 year old female who presents with scheduled outpatient partial splenic embolization and paracentesis, found to have worsening anemia. Increased somnolence, concern for acute hepatic encephalopathy. History of NASH cirrhosis.    PT Comments    Patient is agreeable to PT. She requested to use the bed pan on arrival to room. Increased independence with bed mobility with rolling to left and right. Cues for technique to increase independence with rolling. Patient declined getting out of bed at this time due to needing to drink contrast for upcoming CT scan. Recommend to continue PT to maximize independence and decrease caregiver burden. Anticipate the need for frequent supervision/assistance with ongoing PT recommended at discharge.    Recommendations for follow up therapy are one component of a multi-disciplinary discharge planning process, led by the attending physician.  Recommendations may be updated based on patient status, additional functional criteria and insurance authorization.  Follow Up Recommendations       Assistance Recommended at Discharge Frequent or constant Supervision/Assistance  Patient can return home with the following A little help with walking and/or transfers;A little help with bathing/dressing/bathroom;Assistance with cooking/housework;Help with stairs or ramp for entrance;Assist for transportation   Equipment Recommendations  None recommended by PT    Recommendations for Other Services       Precautions / Restrictions Precautions Precautions: Fall Precaution Comments: post-op shoe on the left for reported skin injury Restrictions Weight Bearing Restrictions: No     Mobility  Bed Mobility Overal bed mobility: Needs Assistance Bed Mobility: Rolling Rolling: Min assist, Min guard          General bed mobility comments: Min guard for rolling to the right and Min A for rolling to the left. cues for technique to increase independence by using bed rail to facilitate turns. patient declined getting out of bed due to needing to drink contrast for her upcoming CT scan. encouraged chair position in bed and/or getting out of bed later today with staff assistance to promote upright conditioning    Transfers                        Ambulation/Gait                   Stairs             Wheelchair Mobility    Modified Rankin (Stroke Patients Only)       Balance                                            Cognition Arousal/Alertness: Awake/alert Behavior During Therapy: WFL for tasks assessed/performed Overall Cognitive Status: Impaired/Different from baseline Area of Impairment: Problem solving, Following commands, Safety/judgement                       Following Commands: Follows one step commands consistently, Follows multi-step commands inconsistently                Exercises      General Comments General comments (skin integrity, edema, etc.): patient urinated in the bed pan with trace bowel movement. she was able to indicate the need to use the bed pan.      Pertinent Vitals/Pain  Pain Assessment Pain Assessment: No/denies pain Pain Score: 0-No pain    Home Living                          Prior Function            PT Goals (current goals can now be found in the care plan section) Acute Rehab PT Goals Patient Stated Goal: to get stronger PT Goal Formulation: With patient Time For Goal Achievement: 01/25/23 Potential to Achieve Goals: Fair Progress towards PT goals: Progressing toward goals    Frequency    Min 3X/week      PT Plan Current plan remains appropriate    Co-evaluation              AM-PAC PT "6 Clicks" Mobility   Outcome Measure  Help needed turning from  your back to your side while in a flat bed without using bedrails?: A Little Help needed moving from lying on your back to sitting on the side of a flat bed without using bedrails?: A Lot Help needed moving to and from a bed to a chair (including a wheelchair)?: A Lot Help needed standing up from a chair using your arms (e.g., wheelchair or bedside chair)?: A Lot Help needed to walk in hospital room?: A Lot Help needed climbing 3-5 steps with a railing? : A Lot 6 Click Score: 13    End of Session   Activity Tolerance: Patient tolerated treatment well Patient left: in bed;with call bell/phone within reach;with bed alarm set   PT Visit Diagnosis: Unsteadiness on feet (R26.81);Muscle weakness (generalized) (M62.81)     Time: 1610-9604 PT Time Calculation (min) (ACUTE ONLY): 31 min  Charges:  $Therapeutic Activity: 23-37 mins                     Donna Bernard, PT, MPT    [name] 01/16/2023, 10:52 AM

## 2023-01-16 NOTE — Progress Notes (Signed)
Inpatient Rehabilitation Admissions Coordinator   Notified by Dr Georgeann Oppenheim that patient not medically ready to discharge to CIR in [place] at [facility] today. I spoke with patient by phone and she is aware. We will follow up on Monday.  Ottie Glazier, RN, MSN Rehab Admissions Coordinator [phone] 01/16/2023 10:01 AM

## 2023-01-16 NOTE — Consult Note (Signed)
Physical Medicine and Rehabilitation Consult Reason for Consult: Assess candidacy for CIR Referring Physician:  Lolita Patella, MD   HPI: Rachel Huber is a 67 y.o. 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 Elby Showers of IR and was found to have worsening anemia. She has been needing almost weekly transfusions. She started developing fevers post blood transfusion on 5/29 on unclear etiology and infectious workup is in progress. Physical Medicine & Rehabilitation was consulted to assess candidacy for CIR.     ROS +right shoulder soreness Past Medical History:  Diagnosis Date   Anemia in chronic kidney disease (CKD) 02/07/2022   Anxiety    Arthritis    Ascites of liver    Weekly paracentesis   Cirrhosis of liver (HCC)    CKD (chronic kidney disease) stage 4, GFR 15-29 ml/min (HCC)    GERD (gastroesophageal reflux disease)    Grade IV internal hemorrhoids    Hypertension    NASH (nonalcoholic steatohepatitis)    Neuropathy, diabetic (HCC)    Obesity (BMI 30-39.9)    OSA on CPAP    Pneumonia    Portal hypertension (HCC)    S/P TIPS (transjugular intrahepatic portosystemic shunt)    Thalassemia minor 1992   Past Surgical History:  Procedure Laterality Date   ABDOMINAL HYSTERECTOMY     BREAST BIOPSY Right 02/15/2018   [place] bx 6-6:30 ribbon shape, ONE CORE FRAGMENT WITH FIBROSIS. ONE CORE FRAGMENT WITH PORTION OF A DILATED   BREAST BIOPSY Right 02/15/2018   [place] bx 9:00 heart shape, USUAL DUCTAL HYPERPLASIA   BREAST LUMPECTOMY Right 03/09/2018   Procedure: BREAST LUMPECTOMY x 2;  Surgeon: Sung Amabile, DO;  Location: [facility]  ORS;  Service: General;  Laterality: Right;   CATARACT EXTRACTION W/PHACO Left 06/11/2016   Procedure: CATARACT EXTRACTION PHACO AND INTRAOCULAR LENS PLACEMENT (IOC);  Surgeon: Sallee Lange, MD;  Location: [facility] ORS;  Service: Ophthalmology;  Laterality: Left;  Lot # [id number] H US:01:38.6 AP%:26.4 CDE:44.15   CATARACT EXTRACTION W/PHACO Right 06/15/2018   Procedure: CATARACT EXTRACTION PHACO AND INTRAOCULAR LENS PLACEMENT (IOC);  Surgeon: Galen Manila, MD;  Location: [facility] ORS;  Service: Ophthalmology;  Laterality: Right;  [place] 00:38.2 CDE 4.23 Fluid Pack Lot # [id number]   COLONOSCOPIES     COLONOSCOPY WITH PROPOFOL N/A 10/10/2020   Procedure: COLONOSCOPY WITH PROPOFOL;  Surgeon: Regis Bill, MD;  Location: [facility] ENDOSCOPY;  Service: Endoscopy;  Laterality: N/A;   COLONOSCOPY WITH PROPOFOL N/A 11/20/2020   Procedure: COLONOSCOPY WITH PROPOFOL;  Surgeon: Regis Bill, MD;  Location: [facility] ENDOSCOPY;  Service: Endoscopy;  Laterality: N/A;  DM STAT CBC, BMP COVID POSITIVE 09/02/2020   COLONOSCOPY WITH PROPOFOL N/A 06/19/2022   Procedure: COLONOSCOPY WITH PROPOFOL;  Surgeon: Regis Bill, MD;  Location: [facility] ENDOSCOPY;  Service: Endoscopy;  Laterality: N/A;   CTR     ESOPHAGOGASTRODUODENOSCOPY (EGD) WITH PROPOFOL N/A 10/10/2020   Procedure: ESOPHAGOGASTRODUODENOSCOPY (EGD) WITH PROPOFOL;  Surgeon: Regis Bill, MD;  Location: [facility] ENDOSCOPY;  Service: Endoscopy;  Laterality: N/A;  COVID POSITIVE 10/08/2020   FLEXIBLE SIGMOIDOSCOPY N/A 02/06/2022   Procedure: FLEXIBLE SIGMOIDOSCOPY;  Surgeon: Regis Bill, MD;  Location: [facility] ENDOSCOPY;  Service: Endoscopy;  Laterality: N/A;  Patient requests anesthesia   FLEXIBLE SIGMOIDOSCOPY N/A 12/19/2022   Procedure: FLEXIBLE SIGMOIDOSCOPY;  Surgeon: Dolores Frame, MD;  Location: [facility] ENDO SUITE;  Service: Gastroenterology;  Laterality: N/A;   IR ANGIOGRAM SELECTIVE EACH ADDITIONAL VESSEL  02/07/2022   IR ANGIOGRAM  SELECTIVE EACH ADDITIONAL VESSEL  02/07/2022   IR ANGIOGRAM SELECTIVE EACH ADDITIONAL VESSEL  09/05/2022   IR ANGIOGRAM VISCERAL SELECTIVE  02/07/2022   IR ANGIOGRAM VISCERAL SELECTIVE  09/05/2022   IR EMBO ART  VEN HEMORR LYMPH EXTRAV  INC GUIDE ROADMAPPING  09/05/2022   IR EMBO ART  VEN HEMORR LYMPH EXTRAV  INC GUIDE ROADMAPPING  11/05/2022   IR EMBO ARTERIAL NOT HEMORR HEMANG INC GUIDE ROADMAPPING  02/07/2022   IR EMBO TUMOR ORGAN ISCHEMIA INFARCT INC GUIDE ROADMAPPING  01/06/2023   IR IMAGING GUIDED PORT INSERTION  06/13/2022   IR IMAGING GUIDED PORT INSERTION  11/25/2022   IR PARACENTESIS  12/17/2021   IR PARACENTESIS  03/25/2022   IR PARACENTESIS  11/25/2022   IR PARACENTESIS  01/06/2023   IR RADIOLOGIST EVAL & MGMT  03/18/2022   IR RADIOLOGIST EVAL & MGMT  05/07/2022   IR RADIOLOGIST EVAL & MGMT  05/16/2022   IR RADIOLOGIST EVAL & MGMT  10/13/2022   IR RADIOLOGIST EVAL & MGMT  12/12/2022   IR US GUIDE VASC ACCESS RIGHT  02/07/2022   IR US GUIDE VASC ACCESS RIGHT  09/05/2022   JOINT REPLACEMENT     KNEE SURGERY Right 09/24/2017   plates and pins   PORT-A-CATH REMOVAL N/A 09/23/2022   Procedure: MINOR REMOVAL PORT-A-CATH;  Surgeon: Franky Macho, MD;  Location: [facility] ORS;  Service: General;  Laterality: N/A;   TEE WITHOUT CARDIOVERSION N/A 09/23/2022   Procedure: TRANSESOPHAGEAL ECHOCARDIOGRAM (TEE);  Surgeon: Antoine Poche, MD;  Location: [facility] ORS;  Service: Endoscopy;  Laterality: N/A;   TOTAL SHOULDER ARTHROPLASTY Right 09/27/2015   Family History  Problem Relation Age of Onset   Breast cancer Mother 65   Lymphoma Mother    Diabetes Father    Kidney cancer Father    Heart disease Father    Diabetes Sister    Breast cancer Sister 16   Social History:  reports that she has never smoked. She has never used smokeless tobacco. She reports that she does not drink alcohol and does not use drugs. Allergies:  Allergies  Allergen Reactions   Gramineae Pollens Other (See Comments)    Sneezing, Running  nose   Latex Rash    Contact rash   Medications Prior to Admission  Medication Sig Dispense Refill   acetaminophen (TYLENOL) 650 MG CR tablet Take 1,300 mg by mouth daily as needed for pain.     albuterol (VENTOLIN HFA) 108 (90 Base) MCG/ACT inhaler Inhale 2 puffs into the lungs every 6 (six) hours as needed for wheezing or shortness of breath. 18 g 0   chlorhexidine (PERIDEX) 0.12 % solution Use as directed 15 mLs in the mouth or throat 2 (two) times daily.     ciprofloxacin (CIPRO) 500 MG tablet Take 500 mg by mouth 2 (two) times daily.     diphenhydrAMINE (BENADRYL) 25 MG tablet Take 25 mg by mouth 2 (two) times daily as needed for itching.     ergocalciferol (VITAMIN D2) 1.25 MG (50000 UT) capsule Take 1 capsule (50,000 Units total) by mouth every 14 (fourteen) days. 7 capsule 1   gabapentin (NEURONTIN) 400 MG capsule Take 400 mg by mouth 2 (two) times daily.  HYDROcodone-acetaminophen (NORCO/VICODIN) 5-325 MG tablet Take 1 tablet by mouth every 6 (six) hours as needed for moderate pain.     melatonin 5 MG TABS Take 10 mg by mouth at bedtime as needed (sleep).     midodrine (PROAMATINE) 10 MG tablet Take 10 mg by mouth 3 (three) times daily.     Multiple Vitamins-Minerals (MULTIVITAMIN WITH MINERALS) tablet Take 1 tablet by mouth daily.     oxymetazoline (AFRIN) 0.05 % nasal spray Place 1 spray into both nostrils daily as needed for congestion.     polyethylene glycol (MIRALAX / GLYCOLAX) 17 g packet Take 17 g by mouth 2 (two) times daily. 14 each 0   sertraline (ZOLOFT) 50 MG tablet Take 50 mg by mouth daily.     spironolactone (ALDACTONE) 25 MG tablet Take 1 tablet (25 mg total) by mouth daily. (Patient taking differently: Take 50 mg by mouth daily.) 30 tablet 1   torsemide 40 MG TABS Take 40 mg by mouth daily. 30 tablet 1   atorvastatin (LIPITOR) 10 MG tablet Take 1 tablet (10 mg total) by mouth daily. (Patient not taking: Reported on 01/07/2023) 30 tablet 2   cyanocobalamin (VITAMIN  B12) 1000 MCG tablet Take 1 tablet (1,000 mcg total) by mouth daily. (Patient not taking: Reported on 01/07/2023) 90 tablet 3   fluconazole (DIFLUCAN) 150 MG tablet Take 1 tablet (150 mg total) by mouth daily. X one dose (Patient not taking: Reported on 01/07/2023) 1 tablet 0    Home: Home Living Family/patient expects to be discharged to:: Private residence Living Arrangements: Spouse/significant other Available Help at Discharge: Family, Available 24 hours/day Type of Home: House Home Access: Stairs to enter [employer] of Steps: 2 small steps Entrance Stairs-Rails: None Home Layout: One level Bathroom Shower/Tub: Engineer, manufacturing systems: Standard Bathroom Accessibility: Yes Home Equipment: Agricultural consultant (2 wheels), Rollator (4 wheels), Cane - single point, Tub bench, Hand held shower head Additional Comments: 5/22 at [facility] HH with [facility] arranged  Lives With: Spouse  Functional History: Prior Function Prior Level of Function : Needs assist Mobility Comments: limited distance ambulation using 4 wheeled walker ADLs Comments: Pt endorses husband assisting with shower for safety and reports things have been getting more difficult recently Functional Status:  Mobility: Bed Mobility Overal bed mobility: Needs Assistance Bed Mobility: Rolling Rolling: Min assist, Min guard Supine to sit: Mod assist Sit to supine: Mod assist General bed mobility comments: Min guard for rolling to the right and Min A for rolling to the left. cues for technique to increase independence by using bed rail to facilitate turns. patient declined getting out of bed due to needing to drink contrast for her upcoming CT scan. encouraged chair position in bed and/or getting out of bed later today with staff assistance to promote upright conditioning Transfers Overall transfer level: Needs assistance Equipment used: Rolling walker (2 wheels) Transfers: Sit to/from Stand Sit to Stand: Mod  assist, From elevated surface Bed to/from chair/wheelchair/BSC transfer type:: Step pivot Step pivot transfers: Min assist General transfer comment: lifting and lowering assistance provided. cues for hand placement with increased time and effort required with all activity. 2 bouts of standing performed. post-op shoe on the left foot, closed shoe on the right Ambulation/Gait Ambulation/Gait assistance: Min assist Gait Distance (Feet): 1 Feet Assistive device: Rolling walker (2 wheels) General Gait Details: cues for upright posture. patient able to take 2 small side steps to the right before needing to sit due to urination Gait velocity:  decreased Pre-gait activities: patient able to take 2 small steps along edge of bed with hand held assistance. activity tolerance limited by fatigue    ADL: ADL Overall ADL's : Needs assistance/impaired Eating/Feeding: Set up Grooming: Set up General ADL Comments: Pt declines OOB activity this session. OT anticipates pt needing mod A for LB self care tasks and set up A - min A for UB.  Cognition: Cognition Overall Cognitive Status: Impaired/Different from baseline Orientation Level: Oriented X4 Cognition Arousal/Alertness: Awake/alert Behavior During Therapy: WFL for tasks assessed/performed Overall Cognitive Status: Impaired/Different from baseline Area of Impairment: Problem solving, Following commands, Safety/judgement Following Commands: Follows one step commands consistently, Follows multi-step commands inconsistently Safety/Judgement: Decreased awareness of safety Problem Solving: Slow processing, Decreased initiation, Requires verbal cues, Difficulty sequencing, Requires tactile cues General Comments: much more alert and participatory than previous session  Blood pressure (!) 133/56, pulse 75, temperature 99.3 F (37.4 C), temperature source Oral, resp. rate 18, height 5\' 9"  (1.753 m), weight 117.5 kg, SpO2 100 %. Physical Exam Gen: no  distress, normal appearing HEENT: oral mucosa pink and moist, NCAT Cardio: Reg rate Chest: normal effort, normal rate of breathing Abd: soft, non-distended Ext: no edema Psych: pleasant, normal affect Skin: intact Neuro: Alert and oriented x3 Musculoskeletal: 5/5 strength throughout  Results for orders placed or performed during the hospital encounter of 01/06/23 (from the past 24 hour(s))  CBC with Differential/Platelet     Status: Abnormal   Collection Time: 01/16/23  5:58 AM  Result Value Ref Range   WBC 8.1 4.0 - 10.5 K/uL   RBC 3.65 (L) 3.87 - 5.11 MIL/uL   Hemoglobin 8.7 (L) 12.0 - 15.0 g/dL   HCT 16.1 (L) 09.6 - 04.5 %   MCV 74.0 (L) 80.0 - 100.0 fL   MCH 23.8 (L) 26.0 - 34.0 pg   MCHC 32.2 30.0 - 36.0 g/dL   RDW 40.9 (H) 81.1 - 91.4 %   Platelets 289 150 - 400 K/uL   nRBC 0.0 0.0 - 0.2 %   Neutrophils Relative % 74 %   Neutro Abs 6.1 1.7 - 7.7 K/uL   Lymphocytes Relative 9 %   Lymphs Abs 0.7 0.7 - 4.0 K/uL   Monocytes Relative 14 %   Monocytes Absolute 1.1 (H) 0.1 - 1.0 K/uL   Eosinophils Relative 1 %   Eosinophils Absolute 0.1 0.0 - 0.5 K/uL   Basophils Relative 1 %   Basophils Absolute 0.0 0.0 - 0.1 K/uL   Immature Granulocytes 1 %   Abs Immature Granulocytes 0.04 0.00 - 0.07 K/uL  Basic metabolic panel     Status: Abnormal   Collection Time: 01/16/23  5:58 AM  Result Value Ref Range   Sodium 133 (L) 135 - 145 mmol/L   Potassium 3.4 (L) 3.5 - 5.1 mmol/L   Chloride 106 98 - 111 mmol/L   CO2 21 (L) 22 - 32 mmol/L   Glucose, Bld 147 (H) 70 - 99 mg/dL   BUN 41 (H) 8 - 23 mg/dL   Creatinine, Ser 7.82 (H) 0.44 - 1.00 mg/dL   Calcium 7.5 (L) 8.9 - 10.3 mg/dL   GFR, Estimated 41 (L) >60 mL/min   Anion gap 6 5 - 15  Procalcitonin     Status: None   Collection Time: 01/16/23  5:58 AM  Result Value Ref Range   Procalcitonin 0.98 ng/mL  Hepatic function panel     Status: Abnormal   Collection Time: 01/16/23  5:58 AM  Result Value Ref  Range   Total Protein 4.3  (L) 6.5 - 8.1 g/dL   Albumin 1.8 (L) 3.5 - 5.0 g/dL   AST 41 15 - 41 U/L   ALT 23 0 - 44 U/L   Alkaline Phosphatase 105 38 - 126 U/L   Total Bilirubin 1.3 (H) 0.3 - 1.2 mg/dL   Bilirubin, Direct 0.6 (H) 0.0 - 0.2 mg/dL   Indirect Bilirubin 0.7 0.3 - 0.9 mg/dL   CT ABDOMEN PELVIS WO CONTRAST  Result Date: 01/16/2023 CLINICAL DATA:  67 year old history decompensated MASH cirrhosis status post TIPS creation and partial splenic embolization in addition to multiple superior rectal artery embolizations for hemorrhagic hemorrhoids. The patient remains admitted after partial splenic embolization on 01/06/2023, with fevers after blood transfusion. EXAM: CT ABDOMEN AND PELVIS WITHOUT CONTRAST TECHNIQUE: Multidetector CT imaging of the abdomen and pelvis was performed following the standard protocol without IV contrast. RADIATION DOSE REDUCTION: This exam was performed according to the departmental dose-optimization program which includes automated exposure control, adjustment of the mA and/or kV according to patient size and/or use of iterative reconstruction technique. COMPARISON:  01/08/2023, 01/06/2023, 09/18/2012 FINDINGS: Lower chest: Trace bilateral pleural effusions, left greater than right, similar to comparison. Similar cardiomegaly. Hepatobiliary: Similar appearing shrunken right lobe with left and caudate lobe hypertrophy. Nodular contour. Tips endograft in place, unchanged. Gallbladder is present with vicarious excretion of contrast and multiple punctate calcific gallstones. No gallbladder wall thickening. Pancreas: Unremarkable. No pancreatic ductal dilatation or surrounding inflammatory changes. Spleen: Geographic hypoattenuation inferior aspect of the spleen, no evidence of internal some collections. Adrenals/Urinary Tract: Adrenal glands are unremarkable. Similar appearing punctate nonobstructive left anterior interpolar renal calculus. Kidneys are otherwise normal, without new renal calculi, focal  lesion, or hydronephrosis. Bladder is unremarkable. Stomach/Bowel: Stomach is within normal limits. Appendix is not. No evidence of bowel wall thickening, distention, or inflammatory changes. Vascular/Lymphatic: Similar appearing postprocedural changes after bilateral superior rectal coil embolization and left inferior polar splenic embolization. Aortic atherosclerosis. No enlarged abdominal or pelvic lymph nodes. Definitively identified Reproductive: Status post hysterectomy. No adnexal masses. Other: Small volume ascites. Diffuse body wall anasarca. Small fluid containing umbilical hernia. Musculoskeletal: No acute osseous abnormality. Multilevel degenerative changes of the lumbar spine. IMPRESSION: 1. No acute abnormality in the abdomen or pelvis to explain leukocytosis. 2. Postprocedural changes after inferior pole partial splenic embolization with expected splenic parenchymal hypoattenuation. No evidence of abscess formation. 3. Similar appearing morphologic changes of cirrhosis and portal hypertension despite presence of indwelling TIPS including splenomegaly and small volume ascites. 4. Diffuse anasarca. 5. Trace bilateral pleural effusions, left greater than right. 6. Unchanged cardiomegaly. 7. Cholelithiasis without definite evidence cholecystitis. Marliss Coots, MD [facility] Radiology Electronically Signed   By: Marliss Coots M.D.   On: 01/16/2023 10:57   DG ABD ACUTE 2+V W 1V CHEST  Result Date: 01/15/2023 CLINICAL DATA:  Paracentesis, fever EXAM: DG ABDOMEN ACUTE WITH 1 VIEW CHEST COMPARISON:  01/08/2023 FINDINGS: Right Port-A-Cath remains in place, unchanged. Cardiomegaly. No confluent airspace opacities or effusions. Nonobstructive bowel gas pattern. No organomegaly, free air or suspicious calcification. IMPRESSION: No evidence of bowel obstruction or free air. Cardiomegaly.  No acute cardiopulmonary disease. Electronically Signed   By: Charlett Nose  M.D.   On: 01/15/2023 03:50    Assessment/Plan: Diagnosis: Debility Does the need for close, 24 hr/day medical supervision in concert with the patient's rehab needs make it unreasonable for this patient to be served in a less intensive setting? Yes Co-Morbidities requiring supervision/potential  complications:  1) Anemia: monitor CBGs and transfuse as needed 2) Fevers: continue infectious workup 3) Shoulder soreness: discussed kpad once fevers resolved 4) NASH cirrhosis 5) portal HTN Due to bladder management, bowel management, safety, skin/wound care, disease management, medication administration, pain management, and patient education, does the patient require 24 hr/day rehab nursing? Yes Does the patient require coordinated care of a physician, rehab nurse, therapy disciplines of PT, OT to address physical and functional deficits in the context of the above medical diagnosis(es)? Yes Addressing deficits in the following areas: balance, endurance, locomotion, strength, transferring, bowel/bladder control, bathing, dressing, feeding, grooming, toileting, and psychosocial support Can the patient actively participate in an intensive therapy program of at least 3 hrs of therapy per day at least 5 days per week? Yes The potential for patient to make measurable gains while on inpatient rehab is excellent Anticipated functional outcomes upon discharge from inpatient rehab are modified independent  with PT, modified independent with OT, independent with SLP. Estimated rehab length of stay to reach the above functional goals is: 10-14 days Anticipated discharge destination: Home Overall Rehab/Functional Prognosis: excellent  POST ACUTE RECOMMENDATIONS: This patient's condition is appropriate for continued rehabilitative care in the following setting: CIR Patient has agreed to participate in recommended program. Yes Note that insurance prior authorization may be required for reimbursement for  recommended care.   I have personally performed a face to face diagnostic evaluation of this patient. Additionally, I have examined the patient's medical record including any pertinent labs and radiographic images. If the physician assistant has documented in this note, I have reviewed and edited or otherwise concur with the physician assistant's documentation.  Thanks,  Horton Chin, MD 01/16/2023

## 2023-01-16 NOTE — Progress Notes (Signed)
PROGRESS NOTE    MEGHANNE STEEGE  ZOX:[id number] DOB: March 07, 1956 DOA: 01/06/2023 PCP: Lauro Regulus, MD    Brief Narrative:   67 y.o. 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 Elby Showers of IR, and was found have worsening anemia.  Patient currently needing almost weekly transfusions.   Patient started developing fevers post blood transfusion 5/29.  Unclear etiology.  Questionable blood transfusion reaction.  Infectious workup in progress.   Assessment & Plan:   Principal Problem:   Anemia Active Problems:   Liver cirrhosis secondary to NASH [facility]   Splenic sequestration   Thrombocytopenia (HCC)   Internal hemorrhoid   Chronic kidney disease, stage 3b (HCC)   Hypotension   Depression with anxiety  New onset fever Noted on 5/29 after blood transfusion.  Unclear etiology.  Infectious workup in progress. Procalcitonin elevated to 1.5, downtrending Urine not obviously infected Plan: Follow cultures, NGTD Monitor vitals and fever curve Continue Augmentin Check CXR Check CT abd/pel ID consulted  Acute on chronic anemia Pancytopenia History of thalassemia minor Has had chronic anemia actually pancytopenia and has been following up oncology.  There are multiple factors for anemia including bleeding from internal hemorrhoids as well as splenic sequestration.  Requiring multiple transfusions  Anemia panel with anemia of chronic disease and no specific deficiency. Plan: Hold transfusion for now.  Hemoglobin 8.7 this morning   Liver cirrhosis secondary to NASH  (HCC) Recurrent ascites Acute hepatic encephalopathy Patient with history of decompensated liver cirrhosis and recurrent ascites.  S/p paracentesis with removal of 4 L of fluid. Patient with increased somnolence and ammonia level of 69-concerning of hepatic encephalopathy.  Started on lactulose enema as she was refusing to take p.o. worsening T. bili, recently has a procedure Apparently was only taking MiraLAX at home as needed. Paracentesis done on 5/30.  5 L off Plan: Continue oral lactulose Continue torsemide and Aldactone   Splenic sequestration S/P partial splenic embolization on 5/21 by IR. Worsening leukocytosis and T. bili -per IR it was anticipated. --started on Zosyn due to fever and worsening leukocytosis -- Currently on Augmentin, course was supposed to terminate on 5/29, have continued due to breakthrough fever   Thrombocytopenia (HCC) Patient has chronic pancytopenia.  Multifactorial with chronic disease, liver cirrhosis and splenic sequestration.     Internal hemorrhoid Apparently these have been ? bleeding requiring multiple transfusions for patient.  Monitoring clinically.  Outpatient follow-up with GI.  Consider possible surgical referral for banding   Chronic kidney disease, stage 3b (HCC) Appears to be at baseline. -Monitor renal function   Hypotension -Continue home midodrine   Depression with anxiety -Continue home sertraline  DVT prophylaxis: SCDs Code Status: Full Family Communication: Jaquelin Rzepecki [phone] on 5/30 Disposition Plan: Status is: Inpatient Remains inpatient appropriate because: New fever.  Workup in progress   Level of care: Telemetry Medical  Consultants:  None  Procedures:  US paracentesis  Antimicrobials: None    Subjective: Seen and examined.  Feels cold this AM  Objective: Vitals:   01/16/23 0122 01/16/23 0358 01/16/23 0522 01/16/23 0731  BP: (!) 122/49 108/69 (!) 128/53 (!) 133/56  Pulse: 80 72 76 75   Resp: 18 18  18 18  Temp: 98.5 F (36.9 C) 98.7 F (37.1 C) 99.3 F (37.4 C) 99.3 F (37.4 C)  TempSrc: Oral   Oral  SpO2: 96% 100% 99% 100%  Weight:      Height:        Intake/Output Summary (Last 24 hours) at 01/16/2023 1004 Last data filed at 01/16/2023 0900 Gross per 24 hour  Intake 220 ml  Output 1050 ml  Net -830 ml   Filed Weights   01/07/23 0013  Weight: 117.5 kg    Examination:  General exam: No acute distress Respiratory system: Lungs clear, normal WOB, RA Cardiovascular system: S1-S2 RRR, no murmurs, no pedal edema Gastrointestinal system: Soft, NT/ND, normal bowel sounds Central nervous system: Alert and oriented. No focal neurological deficits. Extremities: Symmetric 5 x 5 power. Skin: No rashes, lesions or ulcers Psychiatry: Judgement and insight appear normal. Mood & affect appropriate.     Data Reviewed: I have personally reviewed following labs and imaging studies  CBC: Recent Labs  Lab 01/12/23 0545 01/13/23 0550 01/14/23 0515 01/14/23 1757 01/15/23 0007 01/15/23 1200 01/16/23 0558  WBC 12.3* 11.5* 9.3  --  7.7  --  8.1  NEUTROABS  --   --   --   --  5.4  --  6.1  HGB 6.8* 7.6* 7.1* 8.6* 8.6* 8.6* 8.7*  HCT 21.4* 23.1* 22.4* 26.4* 26.5*  --  27.0*  MCV 73.8* 74.0* 75.2*  --  75.1*  --  74.0*  PLT 179 218 247  --  276  --  289   Basic Metabolic Panel: Recent Labs  Lab 01/10/23 0445 01/11/23 0455 01/12/23 0545 01/13/23 0550 01/14/23 0515 01/15/23 0007 01/16/23 0558  NA 139 137 134* 135 135 132* 133*  K 4.1 3.9 3.8 3.4* 3.7 3.5 3.4*  CL 111 110 107 108 108 105 106  CO2 22 23 21* 21* 21* 22 21*  GLUCOSE 122* 128* 127* 150* 150* 208* 147*  BUN 56* 56* 57* 55* 52* 47* 41*  CREATININE 1.97* 1.89* 2.00* 1.73* 1.71* 1.60* 1.41*  CALCIUM 7.7* 7.7* 7.6* 7.7* 7.5* 7.6* 7.5*  MG 1.7 2.0 1.8 1.7 1.6*  --   --    GFR: Estimated Creatinine Clearance: 54.4 mL/min (A) (by C-G formula based on SCr of 1.41 mg/dL (H)). Liver Function  Tests: Recent Labs  Lab 01/10/23 0445 01/15/23 0007 01/16/23 0558  AST 30 36 41  ALT 14 20 23   ALKPHOS 78 116 105  BILITOT 3.1* 2.2* 1.3*  PROT 4.5* 4.8* 4.3*  ALBUMIN 1.9* 2.2* 1.8*   No results for input(s): "LIPASE", "AMYLASE" in the last 168 hours. No results for input(s): "AMMONIA" in the last 168 hours.  Coagulation Profile: Recent Labs  Lab 01/15/23 0007  INR 1.4*   Cardiac Enzymes: No results for input(s): "CKTOTAL", "CKMB", "CKMBINDEX", "TROPONINI" in the last 168 hours. BNP (last 3 results) No results for input(s): "PROBNP" in the last 8760 hours. HbA1C: No results for input(s): "HGBA1C" in the last 72 hours. CBG: No results for input(s): "GLUCAP" in the last 168 hours. Lipid Profile: No results for input(s): "CHOL", "HDL", "LDLCALC", "TRIG", "CHOLHDL", "LDLDIRECT" in the last 72 hours. Thyroid Function Tests: No results for input(s): "TSH", "T4TOTAL", "FREET4", "T3FREE", "THYROIDAB" in the last 72 hours. Anemia Panel: No results for input(s): "VITAMINB12", "FOLATE", "FERRITIN", "TIBC", "IRON", "RETICCTPCT" in the last 72 hours. Sepsis Labs: Recent Labs  Lab 01/15/23 0007 01/15/23 0400 01/16/23 0558  PROCALCITON 1.51  --  0.98  LATICACIDVEN 1.9 1.2  --  Recent Results (from the past 240 hour(s))  Culture, blood (Routine X 2) w Reflex to ID Panel     Status: None   Collection Time: 01/08/23  3:47 PM   Specimen: BLOOD  Result Value Ref Range Status   Specimen Description BLOOD LEFT ANTECUBITAL  Final   Special Requests NONE  Final   Culture   Final    NO GROWTH 5 DAYS Performed at [facility], [address] [place], [place] [address]    Report Status 01/13/2023 FINAL  Final  Culture, blood (Routine X 2) w Reflex to ID Panel     Status: None   Collection Time: 01/08/23  3:53 PM   Specimen: BLOOD  Result Value Ref Range Status   Specimen Description BLOOD BLOOD RIGHT WRIST  Final   Special Requests   Final    BOTTLES DRAWN AEROBIC  AND ANAEROBIC Blood Culture adequate volume   Culture   Final    NO GROWTH 5 DAYS Performed at [facility], [address], [place], [place] [address]    Report Status 01/13/2023 FINAL  Final  Culture, blood (x 2)     Status: None (Preliminary result)   Collection Time: 01/15/23 12:07 AM   Specimen: BLOOD  Result Value Ref Range Status   Specimen Description BLOOD BLOOD LEFT HAND  Final   Special Requests   Final    BOTTLES DRAWN AEROBIC AND ANAEROBIC Blood Culture adequate volume   Culture   Final    NO GROWTH 1 DAY Performed at [facility], [address], [place], [place] [address]    Report Status PENDING  Incomplete  Culture, blood (Routine X 2) w Reflex to ID Panel     Status: None (Preliminary result)   Collection Time: 01/15/23  1:05 AM   Specimen: BLOOD  Result Value Ref Range Status   Specimen Description BLOOD RIGHT HAND  Final   Special Requests   Final    BOTTLES DRAWN AEROBIC AND ANAEROBIC Blood Culture adequate volume   Culture   Final    NO GROWTH 1 DAY Performed at [facility], [address] [place], [place] [address]    Report Status PENDING  Incomplete         Radiology Studies: US Paracentesis  Result Date: 01/16/2023 INDICATION: History of NASH cirrhosis with recurrent ascites despite TIPS creation and revision performed at outside hospital. Patient presents for therapeutic paracentesis. EXAM: ULTRASOUND GUIDED right lower quadrant therapeutic PARACENTESIS MEDICATIONS: 15 mL 1% lidocaine COMPLICATIONS: None immediate. PROCEDURE: Informed written consent was obtained from the patient after a discussion of the risks, benefits and alternatives to treatment. A timeout was performed prior to the initiation of the procedure. Initial ultrasound scanning demonstrates a large amount of ascites within the right lower abdominal quadrant. The right lower abdomen was prepped and draped in the usual sterile fashion. 1% lidocaine was  used for local anesthesia. Following this, a 6 Fr Safe-T-Centesis catheter was introduced. An ultrasound image was saved for documentation purposes. The paracentesis was performed. The catheter was removed and a dressing was applied. The patient tolerated the procedure well without immediate post procedural complication. Patient received post-procedure intravenous albumin; see nursing notes for details. FINDINGS: A total of approximately 5 L of golden fluid was removed. Ordering provider did not request laboratory samples. IMPRESSION: Successful ultrasound-guided paracentesis yielding 5 liters of peritoneal fluid. PLAN: Patient has previously undergone TIPS procedure and revision at outside hospital. Procedure performed by Mina Marble, PA-C  Electronically Signed   By: Corlis Leak M.D.   On: 01/16/2023 07:32   DG ABD ACUTE 2+V W 1V CHEST  Result Date: 01/15/2023 CLINICAL DATA:  Paracentesis, fever EXAM: DG ABDOMEN ACUTE WITH 1 VIEW CHEST COMPARISON:  01/08/2023 FINDINGS: Right Port-A-Cath remains in place, unchanged. Cardiomegaly. No confluent airspace opacities or effusions. Nonobstructive bowel gas pattern. No organomegaly, free air or suspicious calcification. IMPRESSION: No evidence of bowel obstruction or free air. Cardiomegaly.  No acute cardiopulmonary disease. Electronically Signed   By: Charlett Nose M.D.   On: 01/15/2023 03:50        Scheduled Meds:  amoxicillin-clavulanate  1 tablet Oral Q12H   atorvastatin  10 mg Oral Daily   Chlorhexidine Gluconate Cloth  6 each Topical Daily   cyanocobalamin  1,000 mcg Oral Daily   diclofenac Sodium  4 g Topical QID   gabapentin  400 mg Oral BID   iohexol  500 mL Oral Q1H   lactulose  20 g Oral TID   lidocaine (PF)  10 mL Subcutaneous Once   sertraline  50 mg Oral Daily   sodium chloride flush  3 mL Intravenous Q12H   spironolactone  50 mg Oral Daily   torsemide  40 mg Oral Daily   Continuous Infusions:   LOS: 9 days       Tresa Moore, MD [facility]   If 7PM-7AM, please contact night-coverage  01/16/2023, 10:04 AM

## 2023-01-16 NOTE — Consult Note (Signed)
NAME: Rachel Huber  DOB: 05/03/1956  MRN: [id number]  Date/Time: 01/16/2023 1:42 PM  REQUESTING PROVIDER: Dr.Sreenath Subjective:  REASON FOR CONSULT: fever ? Rachel Huber is a 67 y.o. 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, refractory ascites s/p TIPS, IDA needing transfusion of iron  , staph capitis bacteremia in Feb 2024 and port removal, treated with 2 weeks of IV cefazolin, new port implanted in 11/25/22 CKD, beta thalessemia minor Significant rectal bleeding s/p emborrhoid procedure on 02/07/22  superior rectal artery embolization X 2 09/05/22 and 11/05/22 , recurrent grade 4 hemorrhoids with rectal bleed needing recurrent transfusion Recurrent paracentesis, dm, ckd, osa Was in [facility] 12/17/22-12/20/22  with Hb of 4.9 needing multiple PRBC GI did a flex sig and found large external and internal hemorrhoids On 5/212/4 Underwent left radial artery approach to inferior pole of spleen embolization with particles and coil. She was admitted to the hospital as Hb was low at 5.5 received PRBC On 5/22 she was more somnolent, thought to be due to hepatic encephalopathy fever of 102 , blood culture sent , started on zosyn     Fever resolved and recurred after it was switched to augmentin I am asked to see the patient for fever Though she is feeling much better Her daughter says she is the most alert today  Past Medical History:  Diagnosis Date   Anemia in chronic kidney disease (CKD) 02/07/2022   Anxiety    Arthritis    Ascites of liver    Weekly paracentesis   Cirrhosis of liver (HCC)    CKD (chronic kidney disease) stage 4, GFR 15-29 ml/min (HCC)    GERD (gastroesophageal reflux disease)    Grade IV internal hemorrhoids    Hypertension    NASH (nonalcoholic steatohepatitis)    Neuropathy, diabetic (HCC)    Obesity (BMI 30-39.9)    OSA on CPAP    Pneumonia    Portal hypertension (HCC)    S/P TIPS (transjugular intrahepatic portosystemic shunt)    Thalassemia  minor 1992    Past Surgical History:  Procedure Laterality Date   ABDOMINAL HYSTERECTOMY     BREAST BIOPSY Right 02/15/2018   [place] bx 6-6:30 ribbon shape, ONE CORE FRAGMENT WITH FIBROSIS. ONE CORE FRAGMENT WITH PORTION OF A DILATED   BREAST BIOPSY Right 02/15/2018   [place] bx 9:00 heart shape, USUAL DUCTAL HYPERPLASIA   BREAST LUMPECTOMY Right 03/09/2018   Procedure: BREAST LUMPECTOMY x 2;  Surgeon: Sung Amabile, DO;  Location: [facility];  Service: General;  Laterality: Right;   CATARACT EXTRACTION W/PHACO Left 06/11/2016   Procedure: CATARACT EXTRACTION PHACO AND INTRAOCULAR LENS PLACEMENT (IOC);  Surgeon: Sallee Lange, MD;  Location: [facility];  Service: Ophthalmology;  Laterality: Left;  Lot # [id number] H US:01:38.6 AP%:26.4 CDE:44.15   CATARACT EXTRACTION W/PHACO Right 06/15/2018   Procedure: CATARACT EXTRACTION PHACO AND INTRAOCULAR LENS PLACEMENT (IOC);  Surgeon: Galen Manila, MD;  Location: [facility];  Service: Ophthalmology;  Laterality: Right;  [place] 00:38.2 CDE 4.23 Fluid Pack Lot # [id number]   COLONOSCOPIES     COLONOSCOPY WITH PROPOFOL N/A 10/10/2020   Procedure: COLONOSCOPY WITH PROPOFOL;  Surgeon: Regis Bill, MD;  Location: [facility] ENDOSCOPY;  Service: Endoscopy;  Laterality: N/A;   COLONOSCOPY WITH PROPOFOL N/A 11/20/2020   Procedure: COLONOSCOPY WITH PROPOFOL;  Surgeon: Regis Bill, MD;  Location: [facility]OSCOPY;  Service: Endoscopy;  Laterality: N/A;  DM STAT CBC, BMP COVID POSITIVE 09/02/2020   COLONOSCOPY WITH PROPOFOL N/A 06/19/2022  Procedure: COLONOSCOPY WITH PROPOFOL;  Surgeon: Regis Bill, MD;  Location: [facility] ENDOSCOPY;  Service: Endoscopy;  Laterality: N/A;   CTR     ESOPHAGOGASTRODUODENOSCOPY (EGD) WITH PROPOFOL N/A 10/10/2020   Procedure: ESOPHAGOGASTRODUODENOSCOPY (EGD) WITH PROPOFOL;  Surgeon: Regis Bill, MD;  Location: [facility] ENDOSCOPY;  Service: Endoscopy;  Laterality: N/A;  COVID POSITIVE 10/08/2020   FLEXIBLE SIGMOIDOSCOPY N/A 02/06/2022    Procedure: FLEXIBLE SIGMOIDOSCOPY;  Surgeon: Regis Bill, MD;  Location: [facility] ENDOSCOPY;  Service: Endoscopy;  Laterality: N/A;  Patient requests anesthesia   FLEXIBLE SIGMOIDOSCOPY N/A 12/19/2022   Procedure: FLEXIBLE SIGMOIDOSCOPY;  Surgeon: Dolores Frame, MD;  Location: [facility];  Service: Gastroenterology;  Laterality: N/A;   IR ANGIOGRAM SELECTIVE EACH ADDITIONAL VESSEL  02/07/2022   IR ANGIOGRAM SELECTIVE EACH ADDITIONAL VESSEL  02/07/2022   IR ANGIOGRAM SELECTIVE EACH ADDITIONAL VESSEL  09/05/2022   IR ANGIOGRAM VISCERAL SELECTIVE  02/07/2022   IR ANGIOGRAM VISCERAL SELECTIVE  09/05/2022   IR EMBO ART  VEN HEMORR LYMPH EXTRAV  INC GUIDE ROADMAPPING  09/05/2022   IR EMBO ART  VEN HEMORR LYMPH EXTRAV  INC GUIDE ROADMAPPING  11/05/2022   IR EMBO ARTERIAL NOT HEMORR HEMANG INC GUIDE ROADMAPPING  02/07/2022   IR EMBO TUMOR ORGAN ISCHEMIA INFARCT INC GUIDE ROADMAPPING  01/06/2023   IR IMAGING GUIDED PORT INSERTION  06/13/2022   IR IMAGING GUIDED PORT INSERTION  11/25/2022   IR PARACENTESIS  12/17/2021   IR PARACENTESIS  03/25/2022   IR PARACENTESIS  11/25/2022   IR PARACENTESIS  01/06/2023   IR RADIOLOGIST EVAL & MGMT  03/18/2022   IR RADIOLOGIST EVAL & MGMT  05/07/2022   IR RADIOLOGIST EVAL & MGMT  05/16/2022   IR RADIOLOGIST EVAL & MGMT  10/13/2022   IR RADIOLOGIST EVAL & MGMT  12/12/2022   IR US GUIDE VASC ACCESS RIGHT  02/07/2022   IR US GUIDE VASC ACCESS RIGHT  09/05/2022   JOINT REPLACEMENT     KNEE SURGERY Right 09/24/2017   plates and pins   PORT-A-CATH REMOVAL N/A 09/23/2022   Procedure: MINOR REMOVAL PORT-A-CATH;  Surgeon: Franky Macho, MD;  Location: [facility];  Service: General;  Laterality: N/A;   TEE WITHOUT CARDIOVERSION N/A 09/23/2022   Procedure: TRANSESOPHAGEAL ECHOCARDIOGRAM (TEE);  Surgeon: Antoine Poche, MD;  Location: [facility];  Service: Endoscopy;  Laterality: N/A;   TOTAL SHOULDER ARTHROPLASTY Right 09/27/2015    Social History   Socioeconomic History    Marital status: Married    Spouse name: Not on file   Number of children: 0   Years of education: Not on file   Highest education level: Not on file  Occupational History   Occupation: EMPLOYED  Tobacco Use   Smoking status: Never   Smokeless tobacco: Never  Vaping Use   Vaping Use: Never used  Substance and Sexual Activity   Alcohol use: No   Drug use: Never   Sexual activity: Not Currently  Other Topics Concern   Not on file  Social History Narrative   Not on file   Social Determinants of Health   Financial Resource Strain: Low Risk  (04/04/2022)   Overall Financial Resource Strain (CARDIA)    Difficulty of Paying Living Expenses: Not hard at all  Food Insecurity: Patient Declined (01/06/2023)   Hunger Vital Sign    Worried About Running Out of Food in the Last Year: Patient declined    Ran Out of Food in the Last Year: Patient declined  Transportation Needs:  Patient Declined (01/06/2023)   PRAPARE - Administrator, Civil Service (Medical): Patient declined    Lack of Transportation (Non-Medical): Patient declined  Physical Activity: Inactive (04/04/2022)   Exercise Vital Sign    Days of Exercise per Week: 0 days    Minutes of Exercise per Session: 0 min  Stress: No Stress Concern Present (04/04/2022)   [employer] of Occupational Health - Occupational Stress Questionnaire    Feeling of Stress : Only a little  Social Connections: Moderately Isolated (04/04/2022)   Social Connection and Isolation Panel [NHANES]    Frequency of Communication with Friends and Family: More than three times a week    Frequency of Social Gatherings with Friends and Family: Three times a week    Attends Religious Services: Never    Active Member of Clubs or Organizations: No    Attends Banker Meetings: Never    Marital Status: Married  Catering manager Violence: Patient Declined (01/06/2023)   Humiliation, Afraid, Rape, and Kick questionnaire    Fear of Current or  Ex-Partner: Patient declined    Emotionally Abused: Patient declined    Physically Abused: Patient declined    Sexually Abused: Patient declined    Family History  Problem Relation Age of Onset   Breast cancer Mother 28   Lymphoma Mother    Diabetes Father    Kidney cancer Father    Heart disease Father    Diabetes Sister    Breast cancer Sister 80   Allergies  Allergen Reactions   Gramineae Pollens Other (See Comments)    Sneezing, Running nose   Latex Rash    Contact rash   I? Current Facility-Administered Medications  Medication Dose Route Frequency Provider Last Rate Last Admin   acetaminophen (TYLENOL) tablet 650 mg  650 mg Oral Q6H PRN Nolberto Hanlon, MD   650 mg at 01/15/23 1933   Or   acetaminophen (TYLENOL) suppository 650 mg  650 mg Rectal Q6H PRN Nolberto Hanlon, MD       albuterol (PROVENTIL) (2.5 MG/3ML) 0.083% nebulizer solution 3 mL  3 mL Inhalation Q6H PRN Arnetha Courser, MD       alum & mag hydroxide-simeth (MAALOX/MYLANTA) 200-200-20 MG/5ML suspension 15 mL  15 mL Oral Q6H PRN Darlin Priestly, MD   15 mL at 01/12/23 2002   amoxicillin-clavulanate (AUGMENTIN) 875-125 MG per tablet 1 tablet  1 tablet Oral Q12H Lolita Patella B, MD   1 tablet at 01/16/23 0544   atorvastatin (LIPITOR) tablet 10 mg  10 mg Oral Daily Nolberto Hanlon, MD   10 mg at 01/16/23 0818   Chlorhexidine Gluconate Cloth 2 % PADS 6 each  6 each Topical Daily Arnetha Courser, MD   6 each at 01/16/23 8119   cyanocobalamin (VITAMIN B12) tablet 1,000 mcg  1,000 mcg Oral Daily Arnetha Courser, MD   1,000 mcg at 01/16/23 0818   diclofenac Sodium (VOLTAREN) 1 % topical gel 4 g  4 g Topical QID Lolita Patella B, MD   4 g at 01/16/23 1341   gabapentin (NEURONTIN) capsule 400 mg  400 mg Oral BID Arnetha Courser, MD   400 mg at 01/16/23 0818   lactulose (CHRONULAC) 10 GM/15ML solution 20 g  20 g Oral TID Arnetha Courser, MD   20 g at 01/11/23 2051   lidocaine (PF) (XYLOCAINE) 1 % injection 10 mL  10 mL Subcutaneous Once Kennieth Francois, PA       ondansetron [facility]) injection 4 mg  4 mg Intravenous Q6H PRN Darlin Priestly, MD   4 mg at 01/10/23 1517   ondansetron (ZOFRAN-ODT) disintegrating tablet 4 mg  4 mg Oral Q8H PRN Darlin Priestly, MD   4 mg at 01/12/23 2002   sertraline (ZOLOFT) tablet 50 mg  50 mg Oral Daily Arnetha Courser, MD   50 mg at 01/16/23 0818   sodium chloride flush (NS) 0.9 % injection 3 mL  3 mL Intravenous Q12H Nolberto Hanlon, MD   3 mL at 01/15/23 2143   spironolactone (ALDACTONE) tablet 50 mg  50 mg Oral Daily Arnetha Courser, MD   50 mg at 01/16/23 0818   torsemide (DEMADEX) tablet 40 mg  40 mg Oral Daily Arnetha Courser, MD   40 mg at 01/16/23 1610   Facility-Administered Medications Ordered in Other Encounters  Medication Dose Route Frequency Provider Last Rate Last Admin   diphenhydrAMINE (BENADRYL) capsule 25 mg  25 mg Oral Once Pennington, Rebekah M, PA-C         Abtx:  Anti-infectives (From admission, onward)    Start     Dose/Rate Route Frequency Ordered Stop   01/15/23 1800  amoxicillin-clavulanate (AUGMENTIN) 875-125 MG per tablet 1 tablet        1 tablet Oral Every 12 hours 01/15/23 1052     01/12/23 1400  amoxicillin-clavulanate (AUGMENTIN) 875-125 MG per tablet 1 tablet        1 tablet Oral Every 12 hours 01/12/23 0940 01/15/23 0530   01/12/23 1030  amoxicillin-clavulanate (AUGMENTIN) 875-125 MG per tablet 1 tablet  Status:  Discontinued        1 tablet Oral Every 12 hours 01/12/23 0937 01/12/23 0940   01/08/23 1800  piperacillin-tazobactam (ZOSYN) IVPB 3.375 g  Status:  Discontinued        3.375 g 12.5 mL/hr over 240 Minutes Intravenous Every 8 hours 01/08/23 1551 01/12/23 0936       REVIEW OF SYSTEMS:  Const:  fever, chills, negative weight loss Eyes: negative diplopia or visual changes, negative eye pain ENT: negative coryza, negative sore throat Resp: negative cough, hemoptysis, dyspnea Cards: negative for chest pain, palpitations, lower extremity edema GU: negative for frequency,  dysuria and hematuria GI: Negative for abdominal pain, diarrhea, bleeding, constipation Skin: negative for rash and pruritus Heme: negative for easy bruising and gum/nose bleeding MS:  muscle weakness Left great toe wound Neurolo:was somnolent early in the admission Psych: negative for feelings of anxiety, depression  Endocrine: negative for thyroid, diabetes Allergy/Immunology- as above Objective:  VITALS:  BP (!) 133/56 (BP Location: Left Arm)   Pulse 75   Temp 99.3 F (37.4 C) (Oral)   Resp 18   Ht 5\' 9"  (1.753 m)   Wt 117.5 kg   SpO2 100%   BMI 38.25 kg/m   PHYSICAL EXAM:  General: Alert, cooperative, no distress, appears stated age. pale Head: Normocephalic, without obvious abnormality, atraumatic. Eyes: Conjunctivae clear, anicteric sclerae. Pupils are equal ENT Nares normal. No drainage or sinus tenderness. Lips, mucosa, and tongue normal. No Thrush Neck: Supple, symmetrical, no adenopathy, thyroid: non tender no carotid bruit and no JVD. Back: No CVA tenderness. Lungs: Clear to auscultation bilaterally. No Wheezing or Rhonchi. No rales. Heart: Regular rate and rhythm, no murmur, rub or gallop. Abdomen: Soft, non-tender,not distended. Bowel sounds normal. No masses Extremities: left great toe- small wound on the plantar surface- not infected       Skin: No rashes or lesions. Or bruising Lymph: Cervical, supraclavicular normal. Neurologic: Grossly non-focal Pertinent  Labs Lab Results CBC    Component Value Date/Time   WBC 8.1 01/16/2023 0558   RBC 3.65 (L) 01/16/2023 0558   HGB 8.7 (L) 01/16/2023 0558   HCT 27.0 (L) 01/16/2023 0558   PLT 289 01/16/2023 0558   MCV 74.0 (L) 01/16/2023 0558   MCH 23.8 (L) 01/16/2023 0558   MCHC 32.2 01/16/2023 0558   RDW 20.0 (H) 01/16/2023 0558   LYMPHSABS 0.7 01/16/2023 0558   MONOABS 1.1 (H) 01/16/2023 0558   EOSABS 0.1 01/16/2023 0558   BASOSABS 0.0 01/16/2023 0558       Latest Ref Rng & Units 01/16/2023     5:58 AM 01/15/2023   12:07 AM 01/14/2023    5:15 AM  CMP  Glucose 70 - 99 mg/dL 161  096  045   BUN 8 - 23 mg/dL 41  47  52   Creatinine 0.44 - 1.00 mg/dL 4.09  8.11  9.14   Sodium 135 - 145 mmol/L 133  132  135   Potassium 3.5 - 5.1 mmol/L 3.4  3.5  3.7   Chloride 98 - 111 mmol/L 106  105  108   CO2 22 - 32 mmol/L 21  22  21    Calcium 8.9 - 10.3 mg/dL 7.5  7.6  7.5   Total Protein 6.5 - 8.1 g/dL 4.3  4.8    Total Bilirubin 0.3 - 1.2 mg/dL 1.3  2.2    Alkaline Phos 38 - 126 U/L 105  116    AST 15 - 41 U/L 41  36    ALT 0 - 44 U/L 23  20        Microbiology: Recent Results (from the past 240 hour(s))  Culture, blood (Routine X 2) w Reflex to ID Panel     Status: None   Collection Time: 01/08/23  3:47 PM   Specimen: BLOOD  Result Value Ref Range Status   Specimen Description BLOOD LEFT ANTECUBITAL  Final   Special Requests NONE  Final   Culture   Final    NO GROWTH 5 DAYS Performed at [facility], [address] [place], [place] [address]    Report Status 01/13/2023 FINAL  Final  Culture, blood (Routine X 2) w Reflex to ID Panel     Status: None   Collection Time: 01/08/23  3:53 PM   Specimen: BLOOD  Result Value Ref Range Status   Specimen Description BLOOD BLOOD RIGHT WRIST  Final   Special Requests   Final    BOTTLES DRAWN AEROBIC AND ANAEROBIC Blood Culture adequate volume   Culture   Final    NO GROWTH 5 DAYS Performed at [facility], [address] [place], [place] [address]    Report Status 01/13/2023 FINAL  Final  Culture, blood (x 2)     Status: None (Preliminary result)   Collection Time: 01/15/23 12:07 AM   Specimen: BLOOD  Result Value Ref Range Status   Specimen Description BLOOD BLOOD LEFT HAND  Final   Special Requests   Final    BOTTLES DRAWN AEROBIC AND ANAEROBIC Blood Culture adequate volume   Culture   Final    NO GROWTH 1 DAY Performed at [facility], [address], [place], [place] [address]    Report Status  PENDING  Incomplete  Culture, blood (Routine X 2) w Reflex to ID Panel     Status: None (Preliminary result)   Collection Time: 01/15/23  1:05 AM   Specimen: BLOOD  Result Value Ref Range Status   Specimen Description BLOOD RIGHT HAND  Final   Special Requests   Final    BOTTLES DRAWN AEROBIC AND ANAEROBIC Blood Culture adequate volume   Culture   Final    NO GROWTH 1 DAY Performed at [facility], [address], [place], [place] [address]    Report Status PENDING  Incomplete    IMAGING RESULTS: CT abdomen Inferior pole partial splenic embolization with expected splenic parenchymal  hypoattentuation. No evidence of abscess I have personally reviewed the films ? Impression/Recommendation Fever in a patient who is post partial splenic embolization There is currently no evidence of abscess in the spleen which can happen after embolization No evidence of portal vein thrombosis Minimal left pleural effusion- not the cause of fever Clinically no evidence of SBP But will need cell count , culture the next time she has paracentesis Could the fever be due to splenic infarction? Pt currently on augmentin- may consider dc  and monitoring  NASH with decompensated cirrhosis Has TIPS but still has intractable ascites'portal HTN with splenomegaly   H/o rectal bleed due to hemorrhoids- underwent emborrhoids procedure now splenic embolization  Anemia- due to blood loss, thalassemia minor, needing multiple transfusions  Hepatic encephalopathy- resolved   Has port Blood culture neg Do not suspect port infection   H/o staph capitis bacteremia in feb 2024  CKD  ? ? ? ___________________________________________________ Discussed with patient and family at bed side in detail and also with requesting provider [facility] on call this weekend- available by phone for urgent issues

## 2023-01-16 NOTE — Care Management Important Message (Signed)
Important Message  Patient Details  Name: Rachel Huber MRN: [id number] Date of Birth: 05-Sep-1955   Medicare Important Message Given:  Yes     Olegario Messier A Fredi Hurtado 01/16/2023, 10:45 AM

## 2023-01-17 DIAGNOSIS — D649 Anemia, unspecified: Secondary | ICD-10-CM | POA: Diagnosis not present

## 2023-01-17 NOTE — Progress Notes (Addendum)
PROGRESS NOTE    Rachel Huber  ZOX:[id number] DOB: November 18, 1955 DOA: 01/06/2023 PCP: Lauro Regulus, MD    Brief Narrative:   67 y.o. 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 Elby Showers of IR, and was found have worsening anemia.  Patient currently needing almost weekly transfusions.   Patient started developing fevers post blood transfusion 5/29.  Unclear etiology.  Questionable blood transfusion reaction.  Infectious workup in progress.   Assessment & Plan:   Principal Problem:   Anemia Active Problems:   Liver cirrhosis secondary to NASH [facility])   Splenic sequestration   Thrombocytopenia (HCC)   Internal hemorrhoid   Chronic kidney disease, stage 3b (HCC)   Hypotension   Depression with anxiety  New onset fever Noted on 5/29 after blood transfusion.  Unclear etiology.  Infectious workup in progress. Procalcitonin elevated to 1.5, downtrending Urine not obviously infected CT abd without infectious focus ??small splenic infarction Plan: Follow cultures, NGTD Monitor vitals and fever curve Hold ABX and monitor  Acute on chronic anemia Pancytopenia History of thalassemia minor Has had chronic anemia actually pancytopenia and has been following up oncology.  There are multiple factors for anemia including bleeding from internal hemorrhoids as well as splenic sequestration.  Requiring multiple transfusions  Anemia panel with anemia of chronic disease and no specific deficiency. Plan: Hold transfusion for now.  Hemoglobin 8.7 as of 5/31 Check Hb in AM   Liver  cirrhosis secondary to NASH (HCC) Recurrent ascites Acute hepatic encephalopathy Patient with history of decompensated liver cirrhosis and recurrent ascites.  S/p paracentesis with removal of 4 L of fluid. Patient with increased somnolence and ammonia level of 69-concerning of hepatic encephalopathy.  Started on lactulose enema as she was refusing to take p.o. worsening T. bili, recently has a procedure Apparently was only taking MiraLAX at home as needed. Paracentesis done on 5/30.  5 L off Plan: Continue oral lactulose Continue torsemide and Aldactone   Splenic sequestration S/P partial splenic embolization on 5/21 by IR. Worsening leukocytosis and T. bili -per IR it was anticipated. --started on Zosyn due to fever and worsening leukocytosis -- Holding augmentin.  Concern that fever driven by splenic infarct   Thrombocytopenia [facility]) Patient has chronic pancytopenia.  Multifactorial with chronic disease, liver cirrhosis and splenic sequestration.     Internal hemorrhoid Apparently these have been ? bleeding requiring multiple transfusions for patient.  Monitoring clinically.  Outpatient follow-up with GI.  Consider possible surgical referral for banding   Chronic kidney disease, stage 3b (HCC) Appears to be at baseline. -Monitor renal function   Hypotension -Continue home midodrine   Depression with anxiety -Continue home sertraline  DVT prophylaxis: SCDs Code Status: Full Family Communication: Sabel Braund [phone] on 5/30.  Attempted on 6/1, no answer, VM not set up Disposition Plan: Status is: Inpatient Remains inpatient appropriate because: New fever.  Workup in progress   Level of care: Telemetry Medical  Consultants:  None  Procedures:  US paracentesis  Antimicrobials: None    Subjective: Seen and examined.  NAD  Objective: Vitals:   01/16/23 1543 01/16/23 2159 01/17/23 0356 01/17/23 0810  BP: (!) 124/47 (!) 123/53 (!) 125/51 137/61  Pulse: 70 84 87 84  Resp: 17 18 16 16   Temp: 98 F (36.7 C) (!) 100.4 F (38 C) 99.3 F (37.4 C) 98.6 F (37 C)  TempSrc:  Oral    SpO2:  97% 96% 98%  Weight:      Height:        Intake/Output Summary (Last 24 hours) at 01/17/2023 1152 Last data filed at 01/17/2023 0407 Gross per 24 hour  Intake --  Output 1000 ml  Net -1000 ml   Filed Weights   01/07/23 0013  Weight: 117.5 kg    Examination:  General exam: NAD Respiratory system: Lungs clear, normal WOB, RA Cardiovascular system: S1-S2 RRR, no murmurs, no pedal edema Gastrointestinal system: Soft, NT/ND, normal bowel sounds Central nervous system: Alert and oriented. No focal neurological deficits. Extremities: Symmetric 5 x 5 power. Skin: No rashes, lesions or ulcers Psychiatry: Judgement and insight appear normal. Mood & affect appropriate.     Data Reviewed: I have personally reviewed following labs and imaging studies  CBC: Recent Labs  Lab 01/12/23 0545 01/13/23 0550 01/14/23 0515 01/14/23 1757 01/15/23 0007 01/15/23 1200 01/16/23 0558  WBC 12.3* 11.5* 9.3  --  7.7  --  8.1  NEUTROABS  --   --   --   --  5.4  --  6.1  HGB 6.8* 7.6* 7.1* 8.6* 8.6* 8.6* 8.7*  HCT 21.4* 23.1* 22.4* 26.4* 26.5*  --  27.0*  MCV 73.8* 74.0* 75.2*  --  75.1*  --  74.0*  PLT 179 218 247  --  276  --  289   Basic Metabolic Panel: Recent Labs  Lab 01/11/23 0455 01/12/23 0545 01/13/23 0550 01/14/23 0515 01/15/23 0007 01/16/23 0558  NA 137 134* 135 135 132* 133*  K 3.9 3.8 3.4* 3.7 3.5 3.4*  CL 110 107 108 108 105 106  CO2 23 21* 21* 21* 22 21*  GLUCOSE 128* 127* 150* 150* 208* 147*  BUN 56* 57* 55* 52* 47* 41*  CREATININE 1.89* 2.00* 1.73* 1.71* 1.60* 1.41*  CALCIUM 7.7* 7.6* 7.7* 7.5* 7.6* 7.5*  MG 2.0 1.8 1.7 1.6*  --   --    GFR: Estimated Creatinine Clearance: 54.4 mL/min (A) (by C-G formula based on SCr of 1.41 mg/dL (H)). Liver Function Tests: Recent Labs  Lab 01/15/23 0007 01/16/23 0558  AST 36 41   ALT 20 23  ALKPHOS 116 105  BILITOT 2.2* 1.3*  PROT 4.8* 4.3*  ALBUMIN 2.2* 1.8*   No results for input(s): "LIPASE", "AMYLASE" in the last 168 hours. No results for input(s): "AMMONIA" in the last 168 hours.  Coagulation Profile: Recent Labs  Lab 01/15/23 0007  INR 1.4*   Cardiac Enzymes: No results for input(s): "CKTOTAL", "CKMB", "CKMBINDEX", "TROPONINI" in the last 168 hours. BNP (last 3 results) No results for input(s): "PROBNP" in the last 8760 hours. HbA1C: No results for input(s): "HGBA1C" in the last 72 hours. CBG: No results for input(s): "GLUCAP" in the last 168 hours. Lipid Profile: No results for input(s): "CHOL", "HDL", "LDLCALC", "TRIG", "CHOLHDL", "LDLDIRECT" in the last 72 hours. Thyroid Function Tests: No results for input(s): "TSH", "T4TOTAL", "FREET4", "T3FREE", "THYROIDAB" in the last 72 hours. Anemia Panel: No results for input(s): "VITAMINB12", "FOLATE", "FERRITIN", "TIBC", "IRON", "RETICCTPCT" in the last 72 hours. Sepsis Labs: Recent Labs  Lab 01/15/23 0007 01/15/23 0400 01/16/23 0558  PROCALCITON 1.51  --  0.98  LATICACIDVEN 1.9 1.2  --     Recent Results (from the past 240  hour(s))  Culture, blood (Routine X 2) w Reflex to ID Panel     Status: None   Collection Time: 01/08/23  3:47 PM   Specimen: BLOOD  Result Value Ref Range Status   Specimen Description BLOOD LEFT ANTECUBITAL  Final   Special Requests NONE  Final   Culture   Final    NO GROWTH 5 DAYS Performed at [facility], [address], [place], [place] [address]    Report Status 01/13/2023 FINAL  Final  Culture, blood (Routine X 2) w Reflex to ID Panel     Status: None   Collection Time: 01/08/23  3:53 PM   Specimen: BLOOD  Result Value Ref Range Status   Specimen Description BLOOD BLOOD RIGHT WRIST  Final   Special Requests   Final    BOTTLES DRAWN AEROBIC AND ANAEROBIC Blood Culture adequate volume   Culture   Final    NO GROWTH 5 DAYS Performed at [facility], [address] [place], [place] [address]    Report Status 01/13/2023 FINAL  Final  Culture, blood (x 2)     Status: None (Preliminary result)   Collection Time: 01/15/23 12:07 AM   Specimen: BLOOD  Result Value Ref Range Status   Specimen Description BLOOD BLOOD LEFT HAND  Final   Special Requests   Final    BOTTLES DRAWN AEROBIC AND ANAEROBIC Blood Culture adequate volume   Culture   Final    NO GROWTH 2 DAYS Performed at [facility], [address], [place], [place] [address]    Report Status PENDING  Incomplete  Culture, blood (Routine X 2) w Reflex to ID Panel     Status: None (Preliminary result)   Collection Time: 01/15/23  1:05 AM   Specimen: BLOOD  Result Value Ref Range Status   Specimen Description BLOOD RIGHT HAND  Final   Special Requests   Final    BOTTLES DRAWN AEROBIC AND ANAEROBIC Blood Culture adequate volume   Culture   Final    NO GROWTH 2 DAYS Performed at [facility], [address] [place], [place] [address]    Report Status PENDING  Incomplete         Radiology Studies: CT ABDOMEN PELVIS WO CONTRAST  Result Date: 01/16/2023 CLINICAL DATA:  67 year old history decompensated MASH cirrhosis status post TIPS creation and partial splenic embolization in addition to multiple superior rectal artery embolizations for hemorrhagic hemorrhoids. The patient remains admitted after partial splenic embolization on 01/06/2023, with fevers after blood transfusion. EXAM: CT ABDOMEN AND PELVIS WITHOUT CONTRAST TECHNIQUE: Multidetector CT imaging of the abdomen and pelvis was performed following the standard protocol without IV contrast. RADIATION DOSE REDUCTION: This exam was performed according to the departmental dose-optimization program which includes automated exposure control, adjustment of the mA and/or kV according to patient size and/or use of iterative reconstruction technique. COMPARISON:  01/08/2023, 01/06/2023, 09/18/2012  FINDINGS: Lower chest: Trace bilateral pleural effusions, left greater than right, similar to comparison. Similar cardiomegaly. Hepatobiliary: Similar appearing shrunken right lobe with left and caudate lobe hypertrophy. Nodular contour. Tips endograft in place, unchanged. Gallbladder is present with vicarious excretion of contrast and multiple punctate calcific gallstones. No gallbladder wall thickening. Pancreas: Unremarkable. No pancreatic ductal dilatation or surrounding inflammatory changes. Spleen: Geographic hypoattenuation inferior aspect of the spleen, no evidence of internal some collections. Adrenals/Urinary Tract: Adrenal glands are unremarkable. Similar appearing punctate nonobstructive left anterior interpolar renal calculus. Kidneys are otherwise normal, without new renal calculi,  focal lesion, or hydronephrosis. Bladder is unremarkable. Stomach/Bowel: Stomach is within normal limits. Appendix is not. No evidence of bowel wall thickening, distention, or inflammatory changes. Vascular/Lymphatic: Similar appearing postprocedural changes after bilateral superior rectal coil embolization and left inferior polar splenic embolization. Aortic atherosclerosis. No enlarged abdominal or pelvic lymph nodes. Definitively identified Reproductive: Status post hysterectomy. No adnexal masses. Other: Small volume ascites. Diffuse body wall anasarca. Small fluid containing umbilical hernia. Musculoskeletal: No acute osseous abnormality. Multilevel degenerative changes of the lumbar spine. IMPRESSION: 1. No acute abnormality in the abdomen or pelvis to explain leukocytosis. 2. Postprocedural changes after inferior pole partial splenic embolization with expected splenic parenchymal hypoattenuation. No evidence of abscess formation. 3. Similar appearing morphologic changes of cirrhosis and portal hypertension despite presence of indwelling TIPS including splenomegaly and small volume ascites. 4. Diffuse anasarca. 5.  Trace bilateral pleural effusions, left greater than right. 6. Unchanged cardiomegaly. 7. Cholelithiasis without definite evidence cholecystitis. Marliss Coots, MD [facility] Electronically Signed   By: Marliss Coots M.D.   On: 01/16/2023 10:57        Scheduled Meds:  atorvastatin  10 mg Oral Daily   Chlorhexidine Gluconate Cloth  6 each Topical Daily   cyanocobalamin  1,000 mcg Oral Daily   diclofenac Sodium  4 g Topical QID   gabapentin  400 mg Oral BID   lactulose  20 g Oral TID   lidocaine (PF)  10 mL Subcutaneous Once   sertraline  50 mg Oral Daily   sodium chloride flush  3 mL Intravenous Q12H   spironolactone  50 mg Oral Daily   torsemide  40 mg Oral Daily   Continuous Infusions:   LOS: 10 days       Tresa Moore, MD [facility]   If 7PM-7AM, please contact night-coverage  01/17/2023, 11:52 AM

## 2023-01-18 DIAGNOSIS — D649 Anemia, unspecified: Secondary | ICD-10-CM | POA: Diagnosis not present

## 2023-01-18 LAB — CBC WITH DIFFERENTIAL/PLATELET
Abs Immature Granulocytes: 0.03 10*3/uL (ref 0.00–0.07)
Basophils Absolute: 0 10*3/uL (ref 0.0–0.1)
Basophils Relative: 1 %
Eosinophils Absolute: 0.2 10*3/uL (ref 0.0–0.5)
Eosinophils Relative: 3 %
HCT: 26.1 % — ABNORMAL LOW (ref 36.0–46.0)
Hemoglobin: 8.3 g/dL — ABNORMAL LOW (ref 12.0–15.0)
Immature Granulocytes: 0 %
Lymphocytes Relative: 13 %
Lymphs Abs: 1 10*3/uL (ref 0.7–4.0)
MCH: 23.2 pg — ABNORMAL LOW (ref 26.0–34.0)
MCHC: 31.8 g/dL (ref 30.0–36.0)
MCV: 72.9 fL — ABNORMAL LOW (ref 80.0–100.0)
Monocytes Absolute: 0.9 10*3/uL (ref 0.1–1.0)
Monocytes Relative: 13 %
Neutro Abs: 5.3 10*3/uL (ref 1.7–7.7)
Neutrophils Relative %: 70 %
Platelets: 298 10*3/uL (ref 150–400)
RBC: 3.58 MIL/uL — ABNORMAL LOW (ref 3.87–5.11)
RDW: 21.2 % — ABNORMAL HIGH (ref 11.5–15.5)
WBC: 7.5 10*3/uL (ref 4.0–10.5)
nRBC: 0 % (ref 0.0–0.2)

## 2023-01-18 LAB — BASIC METABOLIC PANEL
Anion gap: 7 (ref 5–15)
BUN: 40 mg/dL — ABNORMAL HIGH (ref 8–23)
CO2: 24 mmol/L (ref 22–32)
Calcium: 7.3 mg/dL — ABNORMAL LOW (ref 8.9–10.3)
Chloride: 100 mmol/L (ref 98–111)
Creatinine, Ser: 1.35 mg/dL — ABNORMAL HIGH (ref 0.44–1.00)
GFR, Estimated: 44 mL/min — ABNORMAL LOW (ref >60)
Glucose, Bld: 111 mg/dL — ABNORMAL HIGH (ref 70–99)
Potassium: 3.2 mmol/L — ABNORMAL LOW (ref 3.5–5.1)
Sodium: 131 mmol/L — ABNORMAL LOW (ref 135–145)

## 2023-01-18 LAB — CULTURE, BLOOD (ROUTINE X 2): Culture: NO GROWTH

## 2023-01-18 MED ORDER — POTASSIUM CHLORIDE CRYS ER 20 MEQ PO TBCR
40.0000 meq | EXTENDED_RELEASE_TABLET | Freq: Once | ORAL | Status: AC
  Administered 2023-01-18: 40 meq via ORAL
  Filled 2023-01-18: qty 2

## 2023-01-18 MED ORDER — BUTALBITAL-APAP-CAFFEINE 50-325-40 MG PO TABS
1.0000 | ORAL_TABLET | Freq: Four times a day (QID) | ORAL | Status: DC | PRN
  Administered 2023-01-18: 1 via ORAL
  Filled 2023-01-18: qty 1

## 2023-01-18 MED ORDER — POLYETHYLENE GLYCOL 3350 17 G PO PACK
17.0000 g | PACK | Freq: Every day | ORAL | Status: DC | PRN
  Administered 2023-01-18: 17 g via ORAL
  Filled 2023-01-18: qty 1

## 2023-01-18 NOTE — Progress Notes (Signed)
PROGRESS NOTE    Rachel Huber  ZOX:[id number] DOB: 10/02/1955 DOA: 01/06/2023 PCP: Rachel Regulus, MD    Brief Narrative:   67 y.o. 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  Patient currently needing almost weekly transfusions.   Patient started developing fevers post blood transfusion 5/29.  Unclear etiology.  Questionable blood transfusion reaction.  Infectious workup in progress.  Fever broke 5/31.  Suspect that mild splenic infarction precipitated fever.  No signs of acute infection.     Assessment & Plan:   Principal Problem:   Anemia Active Problems:   Liver cirrhosis secondary to NASH [facility])   Splenic sequestration   Thrombocytopenia (HCC)   Internal hemorrhoid   Chronic kidney disease, stage 3b (HCC)   Hypotension   Depression with anxiety  New onset fever Noted on 5/29 after blood transfusion.  Unclear etiology.  Infectious workup in progress. Procalcitonin elevated to 1.5, downtrending Urine not obviously infected CT abd without infectious focus ??small splenic infarction Plan: Follow cultures, NGTD Monitor vitals and fever curve Hold ABX and monitor If patient remains fever free for the next 24 hours will deem medically stable for discharge on 6/3  Acute on chronic anemia Pancytopenia History of thalassemia minor Has had chronic anemia actually pancytopenia and has been following up oncology.  There are multiple factors for anemia including bleeding from internal hemorrhoids as well as  splenic sequestration.  Requiring multiple transfusions  Anemia panel with anemia of chronic disease and no specific deficiency. Plan: Hold transfusion for now.  Hemoglobin 8. 3 as of 6/2 Check Hb in AM   Liver cirrhosis secondary to NASH (HCC) Recurrent ascites Acute hepatic encephalopathy Patient with history of decompensated liver cirrhosis and recurrent ascites.  S/p paracentesis with removal of 4 L of fluid. Patient with increased somnolence and ammonia level of 69-concerning of hepatic encephalopathy.  Started on lactulose enema as she was refusing to take p.o. worsening T. bili, recently has a procedure Apparently was only taking MiraLAX at home as needed. Paracentesis done on 5/30.  5 L off Plan: Continue oral lactulose Continue torsemide and Aldactone   Splenic sequestration S/P partial splenic embolization on 5/21 by IR. Worsening leukocytosis and T. bili -per IR it was anticipated. --started on Zosyn due to fever and worsening leukocytosis -- Holding augmentin.  Suspicion that fever driven by splenic infarct   Thrombocytopenia [facility]) Patient has chronic pancytopenia.  Multifactorial with chronic disease, liver cirrhosis and splenic sequestration.     Internal hemorrhoid Apparently these have been ? bleeding requiring multiple transfusions for patient.  Monitoring clinically.  Outpatient follow-up with GI.  Consider possible surgical referral for banding   Chronic kidney disease, stage 3b (HCC) Appears to be at baseline. -Monitor renal function   Hypotension -Continue home midodrine   Depression with anxiety -Continue home sertraline  DVT prophylaxis: SCDs Code Status: Full Family Communication: Rachel Huber [phone] on 5/30.  Attempted on 6/1, no answer, VM not set up Disposition Plan: Status is: Inpatient Remains inpatient appropriate because: New fever.  Workup in progress   Level of care: Telemetry Medical  Consultants:  None  Procedures:  US paracentesis  Antimicrobials: None    Subjective: Seen and examined.  NAD  Objective: Vitals:   01/17/23 1654 01/17/23 1940 01/18/23 0455 01/18/23 0748  BP: (!) 133/46 (!) 122/49 (!) 113/49 (!) 116/48  Pulse: 77 80 71 64  Resp: 17 18 14 17   Temp: 99.3 F (37.4 C) 100 F (37.8 C) 98.4 F (36.9 C) 97.7 F (36.5 C)  TempSrc:      SpO2: 98% 98% 98% 97%  Weight:      Height:        Intake/Output Summary (Last 24 hours) at 01/18/2023 1011 Last data filed at 01/18/2023 7829 Gross per 24 hour  Intake 240 ml  Output 1400 ml  Net -1160 ml   Filed Weights   01/07/23 0013  Weight: 117.5 kg    Examination:  General exam: No acute distress Respiratory system: Lungs clear.  Normal work of breathing.  Room air Cardiovascular system: S1-S2 RRR, no murmurs, no pedal edema Gastrointestinal system: Soft, NT/ND, normal bowel sounds Central nervous system: Alert and oriented. No focal neurological deficits. Extremities: Symmetric 5 x 5 power. Skin: No rashes, lesions or ulcers Psychiatry: Judgement and insight appear normal. Mood & affect appropriate.     Data Reviewed: I have personally reviewed following labs and imaging studies  CBC: Recent Labs  Lab 01/13/23 0550 01/14/23 0515 01/14/23 1757 01/15/23 0007 01/15/23 1200 01/16/23 0558 01/18/23 0812  WBC 11.5* 9.3  --  7.7  --  8.1 7.5  NEUTROABS  --   --   --  5.4  --  6.1 5.3  HGB 7.6* 7.1* 8.6* 8.6* 8.6* 8.7* 8.3*  HCT 23.1* 22.4* 26.4* 26.5*  --  27.0* 26.1*  MCV 74.0* 75.2*  --  75.1*  --  74.0* 72.9*  PLT 218 247  --  276  --  289 298   Basic Metabolic Panel: Recent Labs  Lab 01/12/23 0545 01/13/23 0550 01/14/23 0515 01/15/23 0007 01/16/23 0558 01/18/23 0812  NA 134* 135 135 132* 133* 131*  K 3.8 3.4* 3.7 3.5 3.4* 3.2*  CL 107 108 108 105 106 100  CO2 21* 21* 21* 22 21* 24  GLUCOSE 127* 150* 150* 208* 147* 111*  BUN 57* 55* 52* 47* 41* 40*  CREATININE 2.00* 1.73* 1.71* 1.60* 1.41* 1.35*  CALCIUM  7.6* 7.7* 7.5* 7.6* 7.5* 7.3*  MG 1.8 1.7 1.6*  --   --   --    GFR: Estimated Creatinine Clearance: 56.9 mL/min (A) (by C-G formula based on SCr of 1.35 mg/dL (H)). Liver Function Tests: Recent Labs  Lab 01/15/23 0007 01/16/23 0558  AST 36 41  ALT 20 23  ALKPHOS 116 105  BILITOT 2.2* 1.3*  PROT 4.8* 4.3*  ALBUMIN 2.2* 1.8*   No results for input(s): "LIPASE", "AMYLASE" in the last 168 hours. No results for input(s): "AMMONIA" in the last 168 hours.  Coagulation Profile: Recent Labs  Lab 01/15/23 0007  INR 1.4*   Cardiac Enzymes: No results for input(s): "CKTOTAL", "CKMB", "CKMBINDEX", "TROPONINI" in the last 168 hours. BNP (last 3 results) No results for input(s): "PROBNP" in the last 8760 hours. HbA1C: No results for input(s): "HGBA1C" in the last 72 hours. CBG: No results for input(s): "GLUCAP" in the last 168 hours. Lipid Profile: No results for input(s): "CHOL", "HDL", "LDLCALC", "TRIG", "CHOLHDL", "LDLDIRECT" in the last 72 hours. Thyroid Function Tests: No results for input(s): "TSH", "T4TOTAL", "FREET4", "T3FREE", "THYROIDAB" in the last 72 hours. Anemia Panel: No results for input(s): "VITAMINB12", "  FOLATE", "FERRITIN", "TIBC", "IRON", "RETICCTPCT" in the last 72 hours. Sepsis Labs: Recent Labs  Lab 01/15/23 0007 01/15/23 0400 01/16/23 0558  PROCALCITON 1.51  --  0.98  LATICACIDVEN 1.9 1.2  --     Recent Results (from the past 240 hour(s))  Culture, blood (Routine X 2) w Reflex to ID Panel     Status: None   Collection Time: 01/08/23  3:47 PM   Specimen: BLOOD  Result Value Ref Range Status   Specimen Description BLOOD LEFT ANTECUBITAL  Final   Special Requests NONE  Final   Culture   Final    NO GROWTH 5 DAYS Performed at [facility], [address], [place], [place] [address]    Report Status 01/13/2023 FINAL  Final  Culture, blood (Routine X 2) w Reflex to ID Panel     Status: None   Collection Time: 01/08/23  3:53 PM   Specimen:  BLOOD  Result Value Ref Range Status   Specimen Description BLOOD BLOOD RIGHT WRIST  Final   Special Requests   Final    BOTTLES DRAWN AEROBIC AND ANAEROBIC Blood Culture adequate volume   Culture   Final    NO GROWTH 5 DAYS Performed at [facility], [address] [place], [place] [address]    Report Status 01/13/2023 FINAL  Final  Culture, blood (x 2)     Status: None (Preliminary result)   Collection Time: 01/15/23 12:07 AM   Specimen: BLOOD  Result Value Ref Range Status   Specimen Description BLOOD BLOOD LEFT HAND  Final   Special Requests   Final    BOTTLES DRAWN AEROBIC AND ANAEROBIC Blood Culture adequate volume   Culture   Final    NO GROWTH 3 DAYS Performed at [facility], [address] [place], [place] [address]    Report Status PENDING  Incomplete  Culture, blood (Routine X 2) w Reflex to ID Panel     Status: None (Preliminary result)   Collection Time: 01/15/23  1:05 AM   Specimen: BLOOD  Result Value Ref Range Status   Specimen Description BLOOD RIGHT HAND  Final   Special Requests   Final    BOTTLES DRAWN AEROBIC AND ANAEROBIC Blood Culture adequate volume   Culture   Final    NO GROWTH 3 DAYS Performed at [facility], [address] [place], [place] [address]    Report Status PENDING  Incomplete         Radiology Studies: CT ABDOMEN PELVIS WO CONTRAST  Result Date: 01/16/2023 CLINICAL DATA:  67 year old history decompensated MASH cirrhosis status post TIPS creation and partial splenic embolization in addition to multiple superior rectal artery embolizations for hemorrhagic hemorrhoids. The patient remains admitted after partial splenic embolization on 01/06/2023, with fevers after blood transfusion. EXAM: CT ABDOMEN AND PELVIS WITHOUT CONTRAST TECHNIQUE: Multidetector CT imaging of the abdomen and pelvis was performed following the standard protocol without IV contrast. RADIATION DOSE REDUCTION: This exam was performed  according to the departmental dose-optimization program which includes automated exposure control, adjustment of the mA and/or kV according to patient size and/or use of iterative reconstruction technique. COMPARISON:  01/08/2023, 01/06/2023, 09/18/2012 FINDINGS: Lower chest: Trace bilateral pleural effusions, left greater than right, similar to comparison. Similar cardiomegaly. Hepatobiliary: Similar appearing shrunken right lobe with left and caudate lobe hypertrophy. Nodular contour. Tips endograft in place, unchanged. Gallbladder is present with vicarious excretion of contrast and multiple punctate calcific gallstones. No gallbladder wall thickening. Pancreas: Unremarkable.  No pancreatic ductal dilatation or surrounding inflammatory changes. Spleen: Geographic hypoattenuation inferior aspect of the spleen, no evidence of internal some collections. Adrenals/Urinary Tract: Adrenal glands are unremarkable. Similar appearing punctate nonobstructive left anterior interpolar renal calculus. Kidneys are otherwise normal, without new renal calculi, focal lesion, or hydronephrosis. Bladder is unremarkable. Stomach/Bowel: Stomach is within normal limits. Appendix is not. No evidence of bowel wall thickening, distention, or inflammatory changes. Vascular/Lymphatic: Similar appearing postprocedural changes after bilateral superior rectal coil embolization and left inferior polar splenic embolization. Aortic atherosclerosis. No enlarged abdominal or pelvic lymph nodes. Definitively identified Reproductive: Status post hysterectomy. No adnexal masses. Other: Small volume ascites. Diffuse body wall anasarca. Small fluid containing umbilical hernia. Musculoskeletal: No acute osseous abnormality. Multilevel degenerative changes of the lumbar spine. IMPRESSION: 1. No acute abnormality in the abdomen or pelvis to explain leukocytosis. 2. Postprocedural changes after inferior pole partial splenic embolization with expected splenic  parenchymal hypoattenuation. No evidence of abscess formation. 3. Similar appearing morphologic changes of cirrhosis and portal hypertension despite presence of indwelling TIPS including splenomegaly and small volume ascites. 4. Diffuse anasarca. 5. Trace bilateral pleural effusions, left greater than right. 6. Unchanged cardiomegaly. 7. Cholelithiasis without definite evidence cholecystitis. Marliss Coots, MD [facility] Radiology Electronically Signed   By: Marliss Coots M.D.   On: 01/16/2023 10:57        Scheduled Meds:  atorvastatin  10 mg Oral Daily   Chlorhexidine Gluconate Cloth  6 each Topical Daily   cyanocobalamin  1,000 mcg Oral Daily   diclofenac Sodium  4 g Topical QID   gabapentin  400 mg Oral BID   lactulose  20 g Oral TID   lidocaine (PF)  10 mL Subcutaneous Once   sertraline  50 mg Oral Daily   sodium chloride flush  3 mL Intravenous Q12H   spironolactone  50 mg Oral Daily   torsemide  40 mg Oral Daily   Continuous Infusions:   LOS: 11 days       Tresa Moore, MD [facility]   If 7PM-7AM, please contact night-coverage  01/18/2023, 10:11 AM

## 2023-01-18 NOTE — TOC Progression Note (Signed)
Transition of Care [facility]) - Progression Note    Patient Details  Name: Rachel Huber MRN: [id number] Date of Birth: 16-Jan-1956  Transition of Care [facility]) CM/SW Contact  Bing Quarry, RN Phone Number: 01/18/2023, 10:14 AM  Clinical Narrative: 6/2: Per CIR, will follow up on Monday, 01/19/23, for medical readiness to discharge to [facility] CIR. Gabriel Cirri RN CM       Expected Discharge Plan: Home w Home Health Services    Expected Discharge Plan and Services                                   HH Arranged: RN           Social Determinants of Health (SDOH) Interventions SDOH Screenings   Food Insecurity: Patient Declined (01/06/2023)  Housing: Patient Declined (01/06/2023)  Transportation Needs: Patient Declined (01/06/2023)  Utilities: Patient Declined (01/06/2023)  Alcohol Screen: Low Risk  (04/04/2022)  Depression (PHQ2-9): Low Risk  (04/04/2022)  Financial Resource Strain: Low Risk  (04/04/2022)  Physical Activity: Inactive (04/04/2022)  Social Connections: Moderately Isolated (04/04/2022)  Stress: No Stress Concern Present (04/04/2022)  Tobacco Use: Low Risk  (01/09/2023)    Readmission Risk Interventions    12/19/2022   10:27 AM 11/04/2022   11:32 AM 10/17/2022    9:17 AM  Readmission Risk Prevention Plan  Transportation Screening Complete Complete Complete  Medication Review Oceanographer) Complete Complete Complete  PCP or Specialist appointment within 3-5 days of discharge Complete Complete   HRI or Home Care Consult Complete  Complete  SW Recovery Care/Counseling Consult Complete Complete Complete  Palliative Care Screening Not Applicable Not Applicable Not Applicable  Skilled Nursing Facility Not Applicable Not Applicable Not Applicable

## 2023-01-19 ENCOUNTER — Other Ambulatory Visit: Payer: Self-pay

## 2023-01-19 ENCOUNTER — Inpatient Hospital Stay: Payer: Medicare Other

## 2023-01-19 ENCOUNTER — Encounter (HOSPITAL_COMMUNITY): Payer: Self-pay | Admitting: Physical Medicine and Rehabilitation

## 2023-01-19 ENCOUNTER — Inpatient Hospital Stay (HOSPITAL_COMMUNITY): Payer: Medicare Other

## 2023-01-19 ENCOUNTER — Inpatient Hospital Stay (HOSPITAL_COMMUNITY)
Admission: RE | Admit: 2023-01-19 | Discharge: 2023-01-26 | DRG: 945 | Disposition: A | Discharge status: Home / Self Care | Payer: Medicare Other | Source: Other Acute Inpatient Hospital | Attending: Physical Medicine and Rehabilitation | Admitting: Physical Medicine and Rehabilitation

## 2023-01-19 DIAGNOSIS — Z8249 Family history of ischemic heart disease and other diseases of the circulatory system: Secondary | ICD-10-CM | POA: Diagnosis not present

## 2023-01-19 DIAGNOSIS — E1122 Type 2 diabetes mellitus with diabetic chronic kidney disease: Secondary | ICD-10-CM | POA: Diagnosis present

## 2023-01-19 DIAGNOSIS — N184 Chronic kidney disease, stage 4 (severe): Secondary | ICD-10-CM | POA: Diagnosis not present

## 2023-01-19 DIAGNOSIS — D631 Anemia in chronic kidney disease: Secondary | ICD-10-CM | POA: Diagnosis present

## 2023-01-19 DIAGNOSIS — E871 Hypo-osmolality and hyponatremia: Secondary | ICD-10-CM | POA: Diagnosis present

## 2023-01-19 DIAGNOSIS — K7581 Nonalcoholic steatohepatitis (NASH): Secondary | ICD-10-CM | POA: Diagnosis present

## 2023-01-19 DIAGNOSIS — I959 Hypotension, unspecified: Secondary | ICD-10-CM | POA: Diagnosis present

## 2023-01-19 DIAGNOSIS — K219 Gastro-esophageal reflux disease without esophagitis: Secondary | ICD-10-CM | POA: Diagnosis present

## 2023-01-19 DIAGNOSIS — E785 Hyperlipidemia, unspecified: Secondary | ICD-10-CM | POA: Diagnosis present

## 2023-01-19 DIAGNOSIS — K766 Portal hypertension: Secondary | ICD-10-CM | POA: Diagnosis not present

## 2023-01-19 DIAGNOSIS — E876 Hypokalemia: Secondary | ICD-10-CM | POA: Diagnosis present

## 2023-01-19 DIAGNOSIS — R5381 Other malaise: Principal | ICD-10-CM | POA: Diagnosis present

## 2023-01-19 DIAGNOSIS — K7682 Hepatic encephalopathy: Secondary | ICD-10-CM | POA: Diagnosis not present

## 2023-01-19 DIAGNOSIS — D561 Beta thalassemia: Secondary | ICD-10-CM | POA: Diagnosis not present

## 2023-01-19 DIAGNOSIS — E1140 Type 2 diabetes mellitus with diabetic neuropathy, unspecified: Secondary | ICD-10-CM | POA: Diagnosis present

## 2023-01-19 DIAGNOSIS — Z79899 Other long term (current) drug therapy: Secondary | ICD-10-CM | POA: Diagnosis not present

## 2023-01-19 DIAGNOSIS — F32A Depression, unspecified: Secondary | ICD-10-CM | POA: Diagnosis present

## 2023-01-19 DIAGNOSIS — K7460 Unspecified cirrhosis of liver: Secondary | ICD-10-CM | POA: Diagnosis present

## 2023-01-19 DIAGNOSIS — Z807 Family history of other malignant neoplasms of lymphoid, hematopoietic and related tissues: Secondary | ICD-10-CM

## 2023-01-19 DIAGNOSIS — R188 Other ascites: Secondary | ICD-10-CM | POA: Diagnosis present

## 2023-01-19 DIAGNOSIS — Z833 Family history of diabetes mellitus: Secondary | ICD-10-CM

## 2023-01-19 DIAGNOSIS — E8809 Other disorders of plasma-protein metabolism, not elsewhere classified: Secondary | ICD-10-CM | POA: Diagnosis not present

## 2023-01-19 DIAGNOSIS — Z8051 Family history of malignant neoplasm of kidney: Secondary | ICD-10-CM

## 2023-01-19 DIAGNOSIS — Z803 Family history of malignant neoplasm of breast: Secondary | ICD-10-CM | POA: Diagnosis not present

## 2023-01-19 DIAGNOSIS — E11621 Type 2 diabetes mellitus with foot ulcer: Secondary | ICD-10-CM | POA: Diagnosis present

## 2023-01-19 DIAGNOSIS — L97529 Non-pressure chronic ulcer of other part of left foot with unspecified severity: Secondary | ICD-10-CM | POA: Diagnosis present

## 2023-01-19 DIAGNOSIS — D649 Anemia, unspecified: Secondary | ICD-10-CM | POA: Diagnosis not present

## 2023-01-19 DIAGNOSIS — I129 Hypertensive chronic kidney disease with stage 1 through stage 4 chronic kidney disease, or unspecified chronic kidney disease: Secondary | ICD-10-CM | POA: Diagnosis present

## 2023-01-19 DIAGNOSIS — E538 Deficiency of other specified B group vitamins: Secondary | ICD-10-CM

## 2023-01-19 HISTORY — PX: IR PARACENTESIS: IMG2679

## 2023-01-19 LAB — GLUCOSE, PLEURAL OR PERITONEAL FLUID: Glucose, Fluid: 151 mg/dL

## 2023-01-19 LAB — BODY FLUID CELL COUNT WITH DIFFERENTIAL
Eos, Fluid: 0 %
Lymphs, Fluid: 24 %
Monocyte-Macrophage-Serous Fluid: 21 % — ABNORMAL LOW (ref 50–90)
Neutrophil Count, Fluid: 55 % — ABNORMAL HIGH (ref 0–25)
Total Nucleated Cell Count, Fluid: 70 cu mm (ref 0–1000)

## 2023-01-19 LAB — ALBUMIN, PLEURAL OR PERITONEAL FLUID: Albumin, Fluid: <1.5 g/dL

## 2023-01-19 LAB — CULTURE, BLOOD (ROUTINE X 2)

## 2023-01-19 LAB — GRAM STAIN

## 2023-01-19 LAB — GLUCOSE, CAPILLARY
Glucose-Capillary: 160 mg/dL — ABNORMAL HIGH (ref 70–99)
Glucose-Capillary: 154 mg/dL — ABNORMAL HIGH (ref 70–99)

## 2023-01-19 LAB — LACTATE DEHYDROGENASE, PLEURAL OR PERITONEAL FLUID: LD, Fluid: 184 U/L — ABNORMAL HIGH (ref 3–23)

## 2023-01-19 MED ORDER — SERTRALINE HCL 50 MG PO TABS
50.0000 mg | ORAL_TABLET | Freq: Every day | ORAL | Status: DC
  Administered 2023-01-19 – 2023-01-26 (×8): 50 mg via ORAL
  Filled 2023-01-19 (×8): qty 1

## 2023-01-19 MED ORDER — VITAMIN B-12 1000 MCG PO TABS
1000.0000 ug | ORAL_TABLET | Freq: Every day | ORAL | Status: DC
  Administered 2023-01-19 – 2023-01-26 (×8): 1000 ug via ORAL
  Filled 2023-01-19 (×8): qty 1

## 2023-01-19 MED ORDER — JUVEN PO PACK
1.0000 | PACK | Freq: Two times a day (BID) | ORAL | Status: DC
  Administered 2023-01-19: 1 via ORAL
  Filled 2023-01-19: qty 1

## 2023-01-19 MED ORDER — SIMETHICONE 80 MG PO CHEW: 80.0000 mg | CHEWABLE_TABLET | Freq: Four times a day (QID) | ORAL | Status: DC | PRN

## 2023-01-19 MED ORDER — POLYETHYLENE GLYCOL 3350 17 G PO PACK: 17.0000 g | PACK | Freq: Every day | ORAL | Status: DC | PRN

## 2023-01-19 MED ORDER — ACETAMINOPHEN 650 MG RE SUPP: 650.0000 mg | Freq: Four times a day (QID) | RECTAL | Status: DC | PRN

## 2023-01-19 MED ORDER — ALUM & MAG HYDROXIDE-SIMETH 200-200-20 MG/5ML PO SUSP: 15.0000 mL | Freq: Four times a day (QID) | ORAL | Status: DC | PRN

## 2023-01-19 MED ORDER — POTASSIUM CHLORIDE CRYS ER 20 MEQ PO TBCR
40.0000 meq | EXTENDED_RELEASE_TABLET | Freq: Every day | ORAL | Status: DC
  Administered 2023-01-19 – 2023-01-20 (×2): 40 meq via ORAL
  Filled 2023-01-19 (×2): qty 2

## 2023-01-19 MED ORDER — DICLOFENAC SODIUM 1 % EX GEL
4.0000 g | Freq: Four times a day (QID) | CUTANEOUS | Status: DC
  Administered 2023-01-19 – 2023-01-26 (×11): 4 g via TOPICAL
  Filled 2023-01-19: qty 100

## 2023-01-19 MED ORDER — CALCIUM CARBONATE ANTACID 500 MG PO CHEW: 400.0000 mg | CHEWABLE_TABLET | ORAL | Status: DC | PRN

## 2023-01-19 MED ORDER — TORSEMIDE 20 MG PO TABS
40.0000 mg | ORAL_TABLET | Freq: Every day | ORAL | Status: DC
  Administered 2023-01-19 – 2023-01-21 (×3): 40 mg via ORAL
  Filled 2023-01-19 (×3): qty 2

## 2023-01-19 MED ORDER — SPIRONOLACTONE 25 MG PO TABS: 50.0000 mg | ORAL_TABLET | Freq: Every day | ORAL | Status: DC

## 2023-01-19 MED ORDER — LIDOCAINE HCL 1 % IJ SOLN
INTRAMUSCULAR | Status: AC
  Filled 2023-01-19: qty 40

## 2023-01-19 MED ORDER — ONDANSETRON 4 MG PO TBDP: 4.0000 mg | ORAL_TABLET | Freq: Four times a day (QID) | ORAL | Status: DC | PRN

## 2023-01-19 MED ORDER — ATORVASTATIN CALCIUM 10 MG PO TABS
10.0000 mg | ORAL_TABLET | Freq: Every day | ORAL | Status: DC
  Administered 2023-01-19 – 2023-01-26 (×8): 10 mg via ORAL
  Filled 2023-01-19 (×8): qty 1

## 2023-01-19 MED ORDER — BUTALBITAL-APAP-CAFFEINE 50-325-40 MG PO TABS: 1.0000 | ORAL_TABLET | Freq: Four times a day (QID) | ORAL | Status: DC | PRN

## 2023-01-19 MED ORDER — ONDANSETRON 4 MG PO TBDP: 4.0000 mg | ORAL_TABLET | Freq: Three times a day (TID) | ORAL | Status: DC | PRN

## 2023-01-19 MED ORDER — ACETAMINOPHEN 325 MG PO TABS: 650.0000 mg | ORAL_TABLET | Freq: Four times a day (QID) | ORAL | Status: DC | PRN

## 2023-01-19 MED ORDER — SPIRONOLACTONE 25 MG PO TABS
50.0000 mg | ORAL_TABLET | Freq: Every day | ORAL | Status: DC
  Administered 2023-01-20 – 2023-01-25 (×6): 50 mg via ORAL
  Filled 2023-01-19 (×7): qty 2

## 2023-01-19 MED ORDER — SODIUM CHLORIDE 0.9% FLUSH
10.0000 mL | Freq: Two times a day (BID) | INTRAVENOUS | Status: DC
  Administered 2023-01-19 – 2023-01-24 (×3): 10 mL

## 2023-01-19 MED ORDER — LACTULOSE 10 GM/15ML PO SOLN
20.0000 g | Freq: Three times a day (TID) | ORAL | Status: DC
  Administered 2023-01-19 – 2023-01-25 (×16): 20 g via ORAL
  Administered 2023-01-25: 10 g via ORAL
  Administered 2023-01-26: 20 g via ORAL
  Filled 2023-01-19 (×20): qty 30

## 2023-01-19 MED ORDER — CHLORHEXIDINE GLUCONATE CLOTH 2 % EX PADS
6.0000 | MEDICATED_PAD | Freq: Every day | CUTANEOUS | Status: DC
  Administered 2023-01-19 – 2023-01-20 (×2): 6 via TOPICAL

## 2023-01-19 MED ORDER — GABAPENTIN 400 MG PO CAPS
400.0000 mg | ORAL_CAPSULE | Freq: Two times a day (BID) | ORAL | Status: DC
  Administered 2023-01-19 – 2023-01-26 (×14): 400 mg via ORAL
  Filled 2023-01-19 (×14): qty 1

## 2023-01-19 MED ORDER — ALBUTEROL SULFATE (2.5 MG/3ML) 0.083% IN NEBU: 3.0000 mL | INHALATION_SOLUTION | Freq: Four times a day (QID) | RESPIRATORY_TRACT | Status: DC | PRN

## 2023-01-19 MED ORDER — POLYETHYLENE GLYCOL 3350 17 G PO PACK: 17.0000 g | PACK | Freq: Every day | ORAL | 0 refills | Status: DC | PRN

## 2023-01-19 MED ORDER — ACETAMINOPHEN 500 MG PO TABS
500.0000 mg | ORAL_TABLET | Freq: Two times a day (BID) | ORAL | Status: DC | PRN
  Administered 2023-01-20 – 2023-01-25 (×3): 500 mg via ORAL
  Filled 2023-01-19 (×3): qty 1

## 2023-01-19 MED ORDER — ALBUMIN HUMAN 25 % IV SOLN: 25.0000 g | Freq: Once | INTRAVENOUS | Status: DC | PRN

## 2023-01-19 MED ORDER — SODIUM CHLORIDE 0.9% FLUSH: 10.0000 mL | INTRAVENOUS | Status: DC | PRN

## 2023-01-19 MED ORDER — LACTULOSE 10 GM/15ML PO SOLN: 20.0000 g | Freq: Two times a day (BID) | ORAL | 0 refills | Status: DC

## 2023-01-19 MED ORDER — KATE FARMS STANDARD 1.4 PO LIQD
325.0000 mL | Freq: Two times a day (BID) | ORAL | Status: DC
  Administered 2023-01-19: 325 mL via ORAL
  Filled 2023-01-19 (×3): qty 325

## 2023-01-19 MED ORDER — BACITRACIN ZINC 500 UNIT/GM EX OINT
TOPICAL_OINTMENT | Freq: Every day | CUTANEOUS | Status: DC
  Administered 2023-01-20 – 2023-01-23 (×2): 31.5 via TOPICAL
  Filled 2023-01-19: qty 28.4

## 2023-01-19 NOTE — Progress Notes (Signed)
Inpatient Rehab Admissions Coordinator:    I have a CIR bed for this Pt. RN may call report to [phone].  Pt. In agreement to d/c to CIR to participate in intensive rehab expected to last 10-12 days, with the goal of discharging at supervision level with 24/7 assist from her spouse.  Megan Salon, MS, CCC-SLP Rehab Admissions Coordinator  [phone] (celll) [phone] (office)

## 2023-01-19 NOTE — Progress Notes (Signed)
Patient is voiding very small amounts (30 mls) each time when asking to use urinal. Patient has requested to use urinal 3 times in the last hour. She denies any burning sensation and no foul odor.

## 2023-01-19 NOTE — Progress Notes (Signed)
   01/19/23 1600  Spiritual Encounters  Type of Visit Initial  Care provided to: Patient  Referral source Patient request  Reason for visit Advance directives  OnCall Visit No   Ch responded to request for AD. Pt was not available. Ch will follow-up tomorrow.

## 2023-01-19 NOTE — Progress Notes (Deleted)
Message received from R. Pennington PA to call patient and make her aware she does not need to come in this afternoon for a port/flush lab check. Per R. Pennington PA, will recheck blood work next week. Patient has appointment on Monday 01-26-2023.

## 2023-01-19 NOTE — H&P (Signed)
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 Recent admission 5/1 - 12/20/2022 from Hem/onc office for recurrent symptomatic anemia, received 4 units packed red blood cells and repeat Flex/sig showing poor prep with large external hems and splenic embolization recommended by Dr. Freda Munro.   She presented to [facility] 01/06/2023 for planned partial splenic embolization and paracentesis with 4 L yield 01/06/2023 per Dr. Elby Showers but was found to have worsening of anemia with      She was transfused with one units PRBC. She developed somnolence due hepatic encephalopathy and reports of refusing lactulose with rise in ammonia level of 69. She was started on lactulose enema as well as maintained on torsemide and Aldactone. Rise in WBC to 19,800 with low-grade fever noted and infectious work up negative. She was started on Zosyn transitioned to Augmentin and completed antibiotic  course 5/29.  She did have drop in Hgb to 6.8 on 05/27 and was transfused with one unit PRBC. She did develop fever up to 102-103 range on 05/30 which was felt to be due to transfusion reaction as UA negative and BC X 2 pend but negative so far. Leucocytosis and thrombocytopenia has resolved.    She has required ongoing paracentesis as needed with latest completed 01/14/2023 yielding 5 L of golden fluid.  Her latest ammonia level on 05/24 was 65 and she continues on scheduled lactulose.   She is tolerating a regular diet.    PTA- patient lives with  spouse in  1 level home 2 steps to entry.  Ambulates with a 4 wheeled walker and limited endurance and husband assist with ADLs.  Therapy evaluations completed due to patient decreased functional mobility was admitted for a comprehensive rehab program.     Review of Systems  Constitutional:  Positive for chills (arms and feet get cold). Negative for fever.  HENT:  Negative for hearing loss and tinnitus.   Eyes:  Negative for blurred vision and double vision.  Respiratory:  Negative for cough and wheezing.        Shortness of breath with exertion  Cardiovascular:  Negative for chest pain and palpitations.  Gastrointestinal:  Negative for abdominal pain, heartburn and nausea.       GERD  Genitourinary:  Negative for dysuria, flank pain and hematuria.  Musculoskeletal:  Positive for back pain (limits walking distance), joint pain (Right hip, knees aches) and myalgias.  Neurological:  Positive for dizziness (better overall) and headaches.  Psychiatric/Behavioral:  The patient does not have insomnia.   All other systems reviewed and are negative.         Past Medical History:  Diagnosis Date   Anemia in chronic kidney disease (CKD) 02/07/2022   Anxiety     Arthritis     Ascites of liver      Weekly paracentesis   Cirrhosis of liver (HCC)     CKD (chronic kidney disease) stage 4, GFR 15-29 ml/min (HCC)     GERD (gastroesophageal reflux disease)     Grade IV internal hemorrhoids     Hypertension     NASH (nonalcoholic steatohepatitis)  Neuropathy, diabetic (HCC)     Obesity (BMI 30-39.9)     OSA on CPAP     Pneumonia     Portal hypertension (HCC)     S/P TIPS (transjugular intrahepatic portosystemic shunt)     Thalassemia minor 1992           Family History  Problem Relation Age of Onset   Breast cancer Mother 30   Lymphoma Mother     Diabetes Father     Kidney cancer Father     Heart disease Father     Diabetes Sister     Breast cancer Sister 70      Social History:   Married. 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 She went on disability/retired 2022 since fall/hip Fx. She reports that she has never smoked. She has never used smokeless tobacco. She reports that she does not drink alcohol and does not use drugs.          Allergies  Allergen Reactions   Gramineae Pollens Other (See Comments)      Sneezing, Running nose   Latex Rash      Contact rash            Medications Prior to Admission  Medication Sig Dispense Refill   acetaminophen (TYLENOL) 650 MG CR tablet Take 1,300 mg by mouth daily as needed for pain.       albuterol (VENTOLIN HFA) 108 (90 Base) MCG/ACT inhaler Inhale 2 puffs into the lungs every 6 (six) hours as needed for wheezing or shortness of breath. 18 g 0   chlorhexidine (PERIDEX) 0.12 % solution Use as directed 15 mLs in the mouth or throat 2 (two) times daily.       ciprofloxacin (CIPRO) 500 MG tablet Take 500 mg by mouth 2 (two) times daily.       diphenhydrAMINE (BENADRYL) 25 MG tablet Take 25 mg by mouth 2 (two) times daily as needed for itching.       ergocalciferol (VITAMIN D2) 1.25 MG (50000 UT) capsule Take 1 capsule (50,000 Units total) by mouth every 14 (fourteen) days. 7 capsule 1   gabapentin (NEURONTIN) 400 MG capsule Take 400 mg by mouth 2 (two) times daily.       HYDROcodone-acetaminophen (NORCO/VICODIN) 5-325 MG tablet Take 1 tablet by mouth every 6 (six) hours as needed for moderate pain.       melatonin 5 MG TABS Take 10 mg by mouth at bedtime as needed (sleep).       midodrine (PROAMATINE) 10 MG tablet Take 10 mg by mouth 3 (three) times daily.       Multiple Vitamins-Minerals (MULTIVITAMIN WITH MINERALS) tablet Take 1 tablet by mouth daily.       oxymetazoline (AFRIN) 0.05 % nasal spray Place 1 spray into both nostrils daily as needed for congestion.       polyethylene glycol (MIRALAX / GLYCOLAX) 17 g packet Take 17 g by mouth 2 (two) times daily. 14 each 0   sertraline (ZOLOFT) 50 MG tablet Take 50 mg by mouth  daily.       spironolactone (ALDACTONE) 25 MG tablet Take 1 tablet (25 mg total) by mouth daily. (Patient taking differently: Take 50 mg by mouth daily.) 30 tablet 1   torsemide 40 MG TABS Take 40 mg by mouth daily. 30 tablet 1   atorvastatin (LIPITOR) 10 MG tablet Take 1 tablet (10 mg total) by mouth daily. (Patient not taking: Reported on 01/07/2023) 30  tablet 2   cyanocobalamin (VITAMIN B12) 1000 MCG tablet Take 1 tablet (1,000 mcg total) by mouth daily. (Patient not taking: Reported on 01/07/2023) 90 tablet 3   fluconazole (DIFLUCAN) 150 MG tablet Take 1 tablet (150 mg total) by mouth daily. X one dose (Patient not taking: Reported on 01/07/2023) 1 tablet 0        Home: Home Living Family/patient expects to be discharged to:: Private residence Living Arrangements: Spouse/significant other Available Help at Discharge: Family, Available 24 hours/day Type of Home: House Home Access: Stairs to enter [employer] of Steps: 2 small steps Entrance Stairs-Rails: None Home Layout: One level Bathroom Shower/Tub: Engineer, manufacturing systems: Standard Bathroom Accessibility: Yes Home Equipment: Agricultural consultant (2 wheels), Rollator (4 wheels), Cane - single point, Tub bench, Hand held shower head Additional Comments: 5/22 at [facility] new HH with [facility] arranged  Lives With: Spouse   Functional History: Prior Function Prior Level of Function : Needs assist Mobility Comments: limited distance ambulation using 4 wheeled walker ADLs Comments: Pt endorses husband assisting with shower for safety and reports things have been getting more difficult recently   Functional Status:  Mobility: Bed Mobility Overal bed mobility: Needs Assistance Bed Mobility: Rolling Rolling: Min assist, Mod assist (Min A rolling to R, Mod A rolling to L) Supine to sit: Mod assist Sit to supine: Mod assist General bed mobility comments: assistance for BLE and trunk support. increased time and effort  required Transfers Overall transfer level: Needs assistance Equipment used: Rolling walker (2 wheels) Transfers: Sit to/from Stand Sit to Stand: Mod assist, From elevated surface Bed to/from chair/wheelchair/BSC transfer type:: Step pivot Step pivot transfers: Min assist General transfer comment: lifting and lowering assistance provided. cues for hand placement with increased time and effort required with all activity. 2 bouts of standing performed. post-op shoe on the left foot, closed shoe on the right Ambulation/Gait Ambulation/Gait assistance: Min assist Gait Distance (Feet): 1 Feet Assistive device: Rolling walker (2 wheels) General Gait Details: cues for upright posture. patient able to take 2 small side steps to the right before needing to sit due to urination Gait velocity: decreased Pre-gait activities: patient able to take 2 small steps along edge of bed with hand held assistance. activity tolerance limited by fatigue   ADL: ADL Overall ADL's : Needs assistance/impaired Eating/Feeding: Set up Grooming: Set up General ADL Comments: Pt declines OOB activity this session. OT anticipates pt needing mod A for LB self care tasks and set up A - min A for UB.   Cognition: Cognition Overall Cognitive Status: Impaired/Different from baseline Orientation Level: Oriented X4 Cognition Arousal/Alertness: Awake/alert Behavior During Therapy: WFL for tasks assessed/performed Overall Cognitive Status: Impaired/Different from baseline Area of Impairment: Problem solving, Following commands, Safety/judgement Following Commands: Follows one step commands consistently, Follows multi-step commands inconsistently Safety/Judgement: Decreased awareness of safety Problem Solving: Slow processing, Decreased initiation, Requires verbal cues, Difficulty sequencing, Requires tactile cues General Comments: much more alert and participatory than previous session     Blood pressure (!) 133/56, pulse  75, temperature 99.3 F (37.4 C), temperature source Oral, resp. rate 18, height 5\' 9"  (1.753 m), weight 117.5 kg, SpO2 100 %. Physical Exam Vitals reviewed.  Constitutional:      Appearance: She is obese.  HENT:     Head: Normocephalic and atraumatic.  Abdominal:     General: There is distension.     Palpations: Abdomen is soft.  Skin:    General: Skin is warm and dry.  Comments: Dry flaky skin on BLE with tenting and stasis changes.   Neurological:     Mental Status: She is alert.     Comments: Slow to process with delayed output and needs redirection at times.         General: No acute distress Mood and affect are appropriate Heart: Regular rate and rhythm no rubs murmurs or extra sounds Lungs: Clear to auscultation, breathing unlabored, no rales or wheezes Abdomen: Positive bowel sounds, soft nontender to palpation, nondistended Extremities: No clubbing, cyanosis, or edema Skin: No evidence of breakdown, no evidence of rash Neurologic:response time  is slow but is good historian, oriented x 3  motor strength is 5/5 in bilateral deltoid, bicep, tricep, grip, hip flexor, knee extensors, ankle dorsiflexor and plantar flexor Sensory exam normal sensation to light touch and proprioception in bilateral upper and lower extremities No UE tremor  Musculoskeletal: Full range of motion in all 4 extremities. No joint swelling    Lab Results Last 48 Hours        Results for orders placed or performed during the hospital encounter of 01/06/23 (from the past 48 hour(s))  Hemoglobin and hematocrit, blood     Status: Abnormal    Collection Time: 01/14/23  5:57 PM  Result Value Ref Range    Hemoglobin 8.6 (L) 12.0 - 15.0 g/dL    HCT 16.1 (L) 09.6 - 46.0 %      Comment: Performed at [facility], [address] [place], [place] [address]  Urinalysis, Routine w reflex microscopic -Urine, Clean Catch     Status: Abnormal    Collection Time: 01/14/23 10:11 PM  Result Value Ref  Range    Color, Urine YELLOW (A) YELLOW    APPearance CLEAR (A) CLEAR    Specific Gravity, Urine 1.009 1.005 - 1.030    pH 5.0 5.0 - 8.0    Glucose, UA NEGATIVE NEGATIVE mg/dL    Hgb urine dipstick NEGATIVE NEGATIVE    Bilirubin Urine NEGATIVE NEGATIVE    Ketones, ur NEGATIVE NEGATIVE mg/dL    Protein, ur NEGATIVE NEGATIVE mg/dL    Nitrite NEGATIVE NEGATIVE    Leukocytes,Ua TRACE (A) NEGATIVE    RBC / HPF 0-5 0 - 5 RBC/hpf    WBC, UA 0-5 0 - 5 WBC/hpf    Bacteria, UA RARE (A) NONE SEEN    Squamous Epithelial / HPF 0-5 0 - 5 /HPF    Mucus PRESENT      Hyaline Casts, UA PRESENT      Non Squamous Epithelial PRESENT (A) NONE SEEN      Comment: Performed at [facility], [address] [place], [place] [address]  Culture, blood (x 2)     Status: None (Preliminary result)    Collection Time: 01/15/23 12:07 AM    Specimen: BLOOD  Result Value Ref Range    Specimen Description BLOOD BLOOD LEFT HAND      Special Requests          BOTTLES DRAWN AEROBIC AND ANAEROBIC Blood Culture adequate volume    Culture          NO GROWTH 1 DAY Performed at [facility], [address] [place], [place] [address]      Report Status PENDING    CBC with Differential     Status: Abnormal    Collection Time: 01/15/23 12:07 AM  Result Value Ref Range    WBC 7.7 4.0 - 10.5 K/uL    RBC 3.53 (  L) 3.87 - 5.11 MIL/uL    Hemoglobin 8.6 (L) 12.0 - 15.0 g/dL      Comment: Reticulocyte Hemoglobin testing may be clinically indicated, consider ordering this additional test [id number]      HCT 26.5 (L) 36.0 - 46.0 %    MCV 75.1 (L) 80.0 - 100.0 fL    MCH 24.4 (L) 26.0 - 34.0 pg    MCHC 32.5 30.0 - 36.0 g/dL    RDW 03.4 (H) 74.2 - 15.5 %    Platelets 276 150 - 400 K/uL    nRBC 0.0 0.0 - 0.2 %    Neutrophils Relative % 70 %    Neutro Abs 5.4 1.7 - 7.7 K/uL    Lymphocytes Relative 9 %    Lymphs Abs 0.7 0.7 - 4.0 K/uL    Monocytes Relative 19 %    Monocytes Absolute 1.5 (H) 0.1 - 1.0 K/uL     Eosinophils Relative 1 %    Eosinophils Absolute 0.1 0.0 - 0.5 K/uL    Basophils Relative 0 %    Basophils Absolute 0.0 0.0 - 0.1 K/uL    Immature Granulocytes 1 %    Abs Immature Granulocytes 0.05 0.00 - 0.07 K/uL      Comment: Performed at [facility], [address] [place], [place] [address]  Comprehensive metabolic panel     Status: Abnormal    Collection Time: 01/15/23 12:07 AM  Result Value Ref Range    Sodium 132 (L) 135 - 145 mmol/L    Potassium 3.5 3.5 - 5.1 mmol/L    Chloride 105 98 - 111 mmol/L    CO2 22 22 - 32 mmol/L    Glucose, Bld 208 (H) 70 - 99 mg/dL      Comment: Glucose reference range applies only to samples taken after fasting for at least 8 hours.    BUN 47 (H) 8 - 23 mg/dL    Creatinine, Ser 8.75 (H) 0.44 - 1.00 mg/dL    Calcium 7.6 (L) 8.9 - 10.3 mg/dL    Total Protein 4.8 (L) 6.5 - 8.1 g/dL    Albumin 2.2 (L) 3.5 - 5.0 g/dL    AST 36 15 - 41 U/L    ALT 20 0 - 44 U/L    Alkaline Phosphatase 116 38 - 126 U/L    Total Bilirubin 2.2 (H) 0.3 - 1.2 mg/dL    GFR, Estimated 36 (L) >60 mL/min      Comment: (NOTE) Calculated using the CKD-EPI Creatinine Equation (2021)      Anion gap 5 5 - 15      Comment: Performed at [facility] [address] [place], [place] [address]  Lactic acid, plasma     Status: None    Collection Time: 01/15/23 12:07 AM  Result Value Ref Range    Lactic Acid, Venous 1.9 0.5 - 1.9 mmol/L      Comment: Performed at [facility], [address] [place], [place] [address]  Protime-INR     Status: Abnormal    Collection Time: 01/15/23 12:07 AM  Result Value Ref Range    Prothrombin Time 17.4 (H) 11.4 - 15.2 seconds    INR 1.4 (H) 0.8 - 1.2      Comment: (NOTE) INR goal varies based on device and disease states. Performed at [facility], [address] [place], [place] [address]    APTT     Status: None    Collection Time: 01/15/23 12:07  AM  Result Value Ref Range    aPTT 34 24 -  36 seconds      Comment: Performed at [facility], [address] [place], [place] [address]  Procalcitonin     Status: None    Collection Time: 01/15/23 12:07 AM  Result Value Ref Range    Procalcitonin 1.51 ng/mL      Comment:        Interpretation: PCT > 0.5 ng/mL and <= 2 ng/mL: Systemic infection (sepsis) is possible, but other conditions are known to elevate PCT as well. (NOTE)       Sepsis PCT Algorithm           Lower Respiratory Tract                                      Infection PCT Algorithm    ----------------------------     ----------------------------         PCT < 0.25 ng/mL                PCT < 0.10 ng/mL           Strongly encourage             Strongly discourage   discontinuation of antibiotics    initiation of antibiotics    ----------------------------     -----------------------------       PCT 0.25 - 0.50 ng/mL            PCT 0.10 - 0.25 ng/mL               OR       >80% decrease in PCT            Discourage initiation of                                            antibiotics      Encourage discontinuation           of antibiotics    ----------------------------     -----------------------------         PCT >= 0.50 ng/mL              PCT 0.26 - 0.50 ng/mL                AND       <80% decrease in PCT             Encourage initiation of                                             antibiotics       Encourage continuation           of antibiotics    ----------------------------     -----------------------------        PCT >= 0.50 ng/mL                  PCT > 0.50 ng/mL               AND         increase in PCT  Strongly encourage                                      initiation of antibiotics    Strongly encourage escalation           of antibiotics                                     -----------------------------                                           PCT <= 0.25 ng/mL                                                 OR                                         > 80% decrease in PCT                                       Discontinue / Do not initiate                                             antibiotics   Performed at [facility], [address] [place], [place] [address]    Culture, blood (Routine X 2) w Reflex to ID Panel     Status: None (Preliminary result)    Collection Time: 01/15/23  1:05 AM    Specimen: BLOOD  Result Value Ref Range    Specimen Description BLOOD RIGHT HAND      Special Requests          BOTTLES DRAWN AEROBIC AND ANAEROBIC Blood Culture adequate volume    Culture          NO GROWTH 1 DAY Performed at [facility], [address] [place], [place] [address]      Report Status PENDING    Lactic acid, plasma     Status: None    Collection Time: 01/15/23  4:00 AM  Result Value Ref Range    Lactic Acid, Venous 1.2 0.5 - 1.9 mmol/L      Comment: Performed at [facility], [address] [place], [place] [address]  Hemoglobin     Status: Abnormal    Collection Time: 01/15/23 12:00 PM  Result Value Ref Range    Hemoglobin 8.6 (L) 12.0 - 15.0 g/dL      Comment: Performed at [facility], [address] [place], [place] [address]  CBC with Differential/Platelet     Status: Abnormal    Collection Time: 01/16/23  5:58 AM  Result Value Ref Range    WBC 8.1 4.0 - 10.5 K/uL    RBC 3.65 (L) 3.87 - 5.11 MIL/uL    Hemoglobin  8.7 (L) 12.0 - 15.0 g/dL      Comment: Reticulocyte Hemoglobin testing may be clinically indicated, consider ordering this additional test [id number]      HCT 27.0 (L) 36.0 - 46.0 %    MCV 74.0 (L) 80.0 - 100.0 fL    MCH 23.8 (L) 26.0 - 34.0 pg    MCHC 32.2 30.0 - 36.0 g/dL    RDW 78.2 (H) 95.6 - 15.5 %    Platelets 289 150 - 400 K/uL    nRBC 0.0 0.0 - 0.2 %    Neutrophils Relative % 74 %    Neutro Abs 6.1 1.7 - 7.7 K/uL    Lymphocytes Relative 9 %    Lymphs Abs 0.7 0.7 - 4.0 K/uL    Monocytes Relative 14 %     Monocytes Absolute 1.1 (H) 0.1 - 1.0 K/uL    Eosinophils Relative 1 %    Eosinophils Absolute 0.1 0.0 - 0.5 K/uL    Basophils Relative 1 %    Basophils Absolute 0.0 0.0 - 0.1 K/uL    Immature Granulocytes 1 %    Abs Immature Granulocytes 0.04 0.00 - 0.07 K/uL      Comment: Performed at [facility], [address] [place], [place] [address]  Basic metabolic panel     Status: Abnormal    Collection Time: 01/16/23  5:58 AM  Result Value Ref Range    Sodium 133 (L) 135 - 145 mmol/L    Potassium 3.4 (L) 3.5 - 5.1 mmol/L    Chloride 106 98 - 111 mmol/L    CO2 21 (L) 22 - 32 mmol/L    Glucose, Bld 147 (H) 70 - 99 mg/dL      Comment: Glucose reference range applies only to samples taken after fasting for at least 8 hours.    BUN 41 (H) 8 - 23 mg/dL    Creatinine, Ser 6.57 (H) 0.44 - 1.00 mg/dL    Calcium 7.5 (L) 8.9 - 10.3 mg/dL    GFR, Estimated 41 (L) >60 mL/min      Comment: (NOTE) Calculated using the CKD-EPI Creatinine Equation (2021)      Anion gap 6 5 - 15      Comment: Performed at [facility], [address] [place], [place] [address]  Procalcitonin     Status: None    Collection Time: 01/16/23  5:58 AM  Result Value Ref Range    Procalcitonin 0.98 ng/mL      Comment:        Interpretation: PCT > 0.5 ng/mL and <= 2 ng/mL: Systemic infection (sepsis) is possible, but other conditions are known to elevate PCT as well. (NOTE)       Sepsis PCT Algorithm           Lower Respiratory Tract                                      Infection PCT Algorithm    ----------------------------     ----------------------------         PCT < 0.25 ng/mL                PCT < 0.10 ng/mL           Strongly encourage             Strongly discourage   discontinuation of antibiotics    initiation of  antibiotics    ----------------------------     -----------------------------       PCT 0.25 - 0.50 ng/mL            PCT 0.10 - 0.25 ng/mL               OR       >80% decrease  in PCT            Discourage initiation of                                            antibiotics      Encourage discontinuation           of antibiotics    ----------------------------     -----------------------------         PCT >= 0.50 ng/mL              PCT 0.26 - 0.50 ng/mL                AND       <80% decrease in PCT             Encourage initiation of                                             antibiotics       Encourage continuation           of antibiotics    ----------------------------     -----------------------------        PCT >= 0.50 ng/mL                  PCT > 0.50 ng/mL               AND         increase in PCT                  Strongly encourage                                      initiation of antibiotics    Strongly encourage escalation           of antibiotics                                     -----------------------------                                           PCT <= 0.25 ng/mL                                                 OR                                        >  80% decrease in PCT                                       Discontinue / Do not initiate                                             antibiotics   Performed at [facility], [address] [place], [place] [address]    Hepatic function panel     Status: Abnormal    Collection Time: 01/16/23  5:58 AM  Result Value Ref Range    Total Protein 4.3 (L) 6.5 - 8.1 g/dL    Albumin 1.8 (L) 3.5 - 5.0 g/dL    AST 41 15 - 41 U/L    ALT 23 0 - 44 U/L    Alkaline Phosphatase 105 38 - 126 U/L    Total Bilirubin 1.3 (H) 0.3 - 1.2 mg/dL    Bilirubin, Direct 0.6 (H) 0.0 - 0.2 mg/dL    Indirect Bilirubin 0.7 0.3 - 0.9 mg/dL      Comment: Performed at [facility], [address] [place], [place] [address]       Imaging Results (Last 48 hours)  US Paracentesis   Result Date: 01/16/2023 INDICATION: History of NASH cirrhosis with recurrent ascites despite TIPS creation and  revision performed at outside hospital. Patient presents for therapeutic paracentesis. EXAM: ULTRASOUND GUIDED right lower quadrant therapeutic PARACENTESIS MEDICATIONS: 15 mL 1% lidocaine COMPLICATIONS: None immediate. PROCEDURE: Informed written consent was obtained from the patient after a discussion of the risks, benefits and alternatives to treatment. A timeout was performed prior to the initiation of the procedure. Initial ultrasound scanning demonstrates a large amount of ascites within the right lower abdominal quadrant. The right lower abdomen was prepped and draped in the usual sterile fashion. 1% lidocaine was used for local anesthesia. Following this, a 6 Fr Safe-T-Centesis catheter was introduced. An ultrasound image was saved for documentation purposes. The paracentesis was performed. The catheter was removed and a dressing was applied. The patient tolerated the procedure well without immediate post procedural complication. Patient received post-procedure intravenous albumin; see nursing notes for details. FINDINGS: A total of approximately 5 L of golden fluid was removed. Ordering provider did not request laboratory samples. IMPRESSION: Successful ultrasound-guided paracentesis yielding 5 liters of peritoneal fluid. PLAN: Patient has previously undergone TIPS procedure and revision at outside hospital. Procedure performed by Mina Marble, PA-C Electronically Signed   By: Corlis Leak M.D.   On: 01/16/2023 07:32    DG ABD ACUTE 2+V W 1V CHEST   Result Date: 01/15/2023 CLINICAL DATA:  Paracentesis, fever EXAM: DG ABDOMEN ACUTE WITH 1 VIEW CHEST COMPARISON:  01/08/2023 FINDINGS: Right Port-A-Cath remains in place, unchanged. Cardiomegaly. No confluent airspace opacities or effusions. Nonobstructive bowel gas pattern. No organomegaly, free air or suspicious calcification. IMPRESSION: No evidence of bowel obstruction or free air. Cardiomegaly.  No acute cardiopulmonary disease. Electronically Signed    By: Charlett Nose M.D.   On: 01/15/2023 03:50           Blood pressure (!) 133/56, pulse 75, temperature 99.3 F (37.4 C), temperature source Oral, resp. rate 18, height 5\' 9"  (1.753 m), weight 117.5 kg, SpO2 100 %.   Medical Problem List and Plan: 1. Functional  deficits secondary to acute hepatic encephalopathy s/p  splenic emobolization with recurrent anemia             -patient may  shower             -ELOS/Goals: 10-12d 2.  Antithrombotics: -DVT/anticoagulation:  Mechanical: Antiembolism stockings, thigh (TED hose) Bilateral lower extremities             -antiplatelet therapy: N/A 3. Pain Management: Voltaren gel, Neurontin 400 mg twice daily 4. Mood/Behavior/Sleep: Zoloft 50 mg daily             -antipsychotic agents: N/A             --denies insomnia.  5. Neuropsych/cognition: This patient may be intermittently capable of making decisions on her own behalf.             --reports cognition has improved in the past week.  6. Skin/Wound Care: Left foot ulceration followed by podiatry services.  Wound care as directed.  Routine skin checks 7. Fluids/Electrolytes/Nutrition: Routine in and outs with follow-up chemistries 8.  Acute on chronic anemia/pancytopenia: Hx of thalassemia intermedia, hems, s/p splenic embolization.    --S/p  multiple admission w/multiple transfusions --Iron infusion held due to concerns of overload due to  multiple transfusion.  Will check stool guaic  9.  CKD stage III.  Baseline SCr-  10.  NASH w/cirrhosis, Hems, ascites: Lactulose TID--has refused daily since 01/12/23.  --Add low salt restrictions and daily weights.   --Recheck ammonia level in am. --Continue torsemide and aldactone daily to help manage ascites.  --question recurrence of ascites. Will order ultrasound.  11.  Hyperlipidemia.  Now on Lipitor 12.  Chronic orthostasis. Midodrine was discontinued on 05/28 as was being held.  --Will order orthostatic BP. Add TED hose  13.  Hyponatremia/Hypokalemia: Supplemented yesterday but down again to 3.2             --will supplement today.        Jacquelynn Cree, PA-C 01/16/2023 "I have personally performed a face to face diagnostic evaluation of this patient.  Additionally, I have reviewed and concur with the physician assistant's documentation above." Erick Colace M.D. [facility] Fellow Am Acad of Phys Med and Rehab Diplomate Am Board of Electrodiagnostic Med Fellow Am Board of Interventional Pain

## 2023-01-19 NOTE — Progress Notes (Addendum)
Inpatient Rehabilitation Admission Medication Review by a Pharmacist  A complete drug regimen review was completed for this patient to identify any potential clinically significant medication issues.  High Risk Drug Classes Is patient taking? Indication by Medication  Antipsychotic No   Anticoagulant No   Antibiotic No   Opioid No   Antiplatelet No   Hypoglycemics/insulin No   Vasoactive Medication Yes Spironolactone, torsemide - Cirrhosis,diuresis   Chemotherapy No   Other Yes Albuterol prn SOB Atorvastatin - HLD Gabapentin - Pain Lactulose - Cirrhosis Ondansetron - prn N/V Potassium - supplement Fioricet - prn migraines     Type of Medication Issue Identified Description of Issue Recommendation(s)  Drug Interaction(s) (clinically significant)     Duplicate Therapy     Allergy     No Medication Administration End Date     Incorrect Dose     Additional Drug Therapy Needed     Significant med changes from prior encounter (inform family/care partners about these prior to discharge).    Other       Clinically significant medication issues were identified that warrant physician communication and completion of prescribed/recommended actions by midnight of the next day:  No  Time spent performing this drug regimen review (minutes):  20 minutes  Thank you Okey Regal, PharmD

## 2023-01-19 NOTE — Progress Notes (Signed)
PMR Admission Coordinator Pre-Admission Assessment   Patient: Rachel Huber is an 67 y.o., female MRN: [id number] DOB: June 01, 1956 Height: 5\' 9"  (175.3 cm) Weight: 117.5 kg                                                                                                                                                  Insurance Information HMO:     PPO:      PCP:      IPA:      80/20:      OTHER:  PRIMARY: Medicare a and b      Policy#: [id number]      Subscriber: pt Benefits:  Phone #: passport one source     Name: 5/28 Eff. Date: 11/17/2022 Deduct: $1632      Out of Pocket Max: none      Life Max: none CIR: 100%      SNF: 20 full days Outpatient: 80%     Co-Pay: 20% Home Health: 100%      Co-Pay: none DME: 80%     Co-Pay: 20% Providers: pt choice  SECONDARY: [employer]      Policy#: [id number]   Financial Counselor:       Phone#:    The "Data Collection Information Summary" for patients in Inpatient Rehabilitation Facilities with attached "Privacy Act Statement-Health Care Records" was provided and verbally reviewed with: Patient and Family   Emergency Contact Information Contact Information       Name Relation Home Work Mobile    Rachel Huber Spouse [phone]   [phone]    Rachel Huber Sister [phone]             Current Medical History  Patient Admitting Diagnosis: Debility   History of Present Illness:    Rachel Huber is a 67 year old right-handed female with complicated medical history including type 2 diabetes mellitus, hypertension, recurrent grade 4 internal hemorrhoids with associated anemia/pancytopenia followed by oncology services requiring frequent transfusions, OSA on CPAP,  hyperlipidemia, chronic orthostasis maintained on ProAmatine, CKD stage III as well as 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  Patient is working with [facility] to  complete liver transplant workup.  Patient with recent admission 12/17/2022 - 12/20/2022 for recurrent symptomatic anemia and did receive 4 units packed red blood cells.   Per chart review patient lives with spouse.  1 level home 2 steps to entry.  Ambulates with a 4 wheeled walker and limited endurance.  Presented to [facility] 01/06/2023 for planned partial splenic embolization and paracentesis with 4 L yield 01/06/2023 per Dr. Elby Showers.     Hospital course was found to have worsening anemia 5.5 and transfused.  Noted to have increased somnolence and ammonia level of 69 concerning for hepatic encephalopathy.  She was  started on lactulose enema as well as maintained on torsemide and Aldactone..  Patient developed leukocytosis 19,800 with low-grade fever and blood cultures no growth to date and was started on Zosyn transitioned to Augmentin completing course 5/29.  Her hemoglobin has stabilized at 8.6.  History of AKI latest creatinine 1.60.  She has required ongoing paracentesis as needed with latest completed 01/14/2023 yielding 5 L of golden fluid.  Her latest ammonia level on 05/24 was 65 and she continues on scheduled lactulose.  Tolerating a regular diet.  Therapy evaluations completed due to patient decreased functional mobility was admitted for a comprehensive rehab program.   Patient's medical record from [facility] has been reviewed by the rehabilitation admission coordinator and physician.   Past Medical History      Past Medical History:  Diagnosis Date   Anemia in chronic kidney disease (CKD) 02/07/2022   Anxiety     Arthritis     Ascites of liver      Weekly paracentesis   Cirrhosis of liver (HCC)     CKD (chronic kidney disease) stage 4, GFR 15-29 ml/min (HCC)     GERD (gastroesophageal reflux disease)     Grade IV internal hemorrhoids     Hypertension     NASH (nonalcoholic steatohepatitis)     Neuropathy, diabetic (HCC)     Obesity (BMI 30-39.9)     OSA on CPAP     Pneumonia     Portal  hypertension (HCC)     S/P TIPS (transjugular intrahepatic portosystemic shunt)     Thalassemia minor 1992    Has the patient had major surgery during 100 days prior to admission? Yes   Family History  family history includes Breast cancer (age of onset: 70) in her mother; Breast cancer (age of onset: 1) in her sister; Diabetes in her father and sister; Heart disease in her father; Kidney cancer in her father; Lymphoma in her mother.     Current Medications    Current Facility-Administered Medications:    acetaminophen (TYLENOL) tablet 650 mg, 650 mg, Oral, Q6H PRN, 650 mg at 01/15/23 1933 **OR** acetaminophen (TYLENOL) suppository 650 mg, 650 mg, Rectal, Q6H PRN, Nolberto Hanlon, MD   albuterol (PROVENTIL) (2.5 MG/3ML) 0.083% nebulizer solution 3 mL, 3 mL, Inhalation, Q6H PRN, Arnetha Courser, MD   alum & mag hydroxide-simeth (MAALOX/MYLANTA) 200-200-20 MG/5ML suspension 15 mL, 15 mL, Oral, Q6H PRN, Darlin Priestly, MD, 15 mL at 01/12/23 2002   amoxicillin-clavulanate (AUGMENTIN) 875-125 MG per tablet 1 tablet, 1 tablet, Oral, Q12H, Sreenath, Sudheer B, MD, 1 tablet at 01/16/23 0544   atorvastatin (LIPITOR) tablet 10 mg, 10 mg, Oral, Daily, Nolberto Hanlon, MD, 10 mg at 01/16/23 0818   Chlorhexidine Gluconate Cloth 2 % PADS 6 each, 6 each, Topical, Daily, Arnetha Courser, MD, 6 each at 01/16/23 4098   cyanocobalamin (VITAMIN B12) tablet 1,000 mcg, 1,000 mcg, Oral, Daily, Arnetha Courser, MD, 1,000 mcg at 01/16/23 0818   diclofenac Sodium (VOLTAREN) 1 % topical gel 4 Huber, 4 Huber, Topical, QID, Sreenath, Sudheer B, MD, 4 Huber at 01/16/23 1341   gabapentin (NEURONTIN) capsule 400 mg, 400 mg, Oral, BID, Amin, Tilman Neat, MD, 400 mg at 01/16/23 0818   lactulose (CHRONULAC) 10 GM/15ML solution 20 Huber, 20 Huber, Oral, TID, Arnetha Courser, MD, 20 Huber at 01/11/23 2051   lidocaine (PF) (XYLOCAINE) 1 % injection 10 mL, 10 mL, Subcutaneous, Once, Mina Marble L, PA   ondansetron [facility]) injection 4 mg, 4 mg, Intravenous, Q6H PRN, Fran Lowes,  Inetta Fermo, MD, 4 mg at 01/10/23 1517   ondansetron (ZOFRAN-ODT) disintegrating tablet 4 mg, 4 mg, Oral, Q8H PRN, Darlin Priestly, MD, 4 mg at 01/12/23 2002   sertraline (ZOLOFT) tablet 50 mg, 50 mg, Oral, Daily, Amin, Tilman Neat, MD, 50 mg at 01/16/23 0818   sodium chloride flush (NS) 0.9 % injection 3 mL, 3 mL, Intravenous, Q12H, Nolberto Hanlon, MD, 3 mL at 01/15/23 2143   spironolactone (ALDACTONE) tablet 50 mg, 50 mg, Oral, Daily, Arnetha Courser, MD, 50 mg at 01/16/23 0818   torsemide (DEMADEX) tablet 40 mg, 40 mg, Oral, Daily, Arnetha Courser, MD, 40 mg at 01/16/23 1610   Facility-Administered Medications Ordered in Other Encounters:    diphenhydrAMINE (BENADRYL) capsule 25 mg, 25 mg, Oral, Once, Pennington, Rebekah M, PA-C   Patients Current Diet:  Diet Order                  Diet regular Room service appropriate? Yes; Fluid consistency: Thin  Diet effective now                         Precautions / Restrictions Precautions Precautions: Fall Precaution Comments: post-op shoe on the left for reported skin injury Restrictions Weight Bearing Restrictions: No RUE Weight Bearing: Weight bearing as tolerated LUE Weight Bearing: Weight bearing as tolerated RLE Weight Bearing: Weight bearing as tolerated LLE Weight Bearing: Weight bearing as tolerated    Has the patient had 2 or more falls or a fall with injury in the past year?No   Prior Activity Level Limited Community (1-2x/wk): Mod I with RW; assisted with adls   Prior Functional Level Prior Function Prior Level of Function : Needs assist Mobility Comments: limited distance ambulation using 4 wheeled walker ADLs Comments: Pt endorses husband assisting with shower for safety and reports things have been getting more difficult recently   Self Care: Did the patient need help bathing, dressing, using the toilet or eating?  Needed some help   Indoor Mobility: Did the patient need assistance with walking from room to room (with or without  device)? Independent   Stairs: Did the patient need assistance with internal or external stairs (with or without device)? Independent   Functional Cognition: Did the patient need help planning regular tasks such as shopping or remembering to take medications? Independent   Patient Information Are you of Hispanic, Latino/a,or Spanish origin?: A. No, not of Hispanic, Latino/a, or Spanish origin What is your race?: A. White Do you need or want an interpreter to communicate with a doctor or health care staff?: 0. No   Patient's Response To:  Health Literacy and Transportation Is the patient able to respond to health literacy and transportation needs?: Yes Health Literacy - How often do you need to have someone help you when you read instructions, pamphlets, or other written material from your doctor or pharmacy?: Never In the past 12 months, has lack of transportation kept you from medical appointments or from getting medications?: No In the past 12 months, has lack of transportation kept you from meetings, work, or from getting things needed for daily living?: No   Home Assistive Devices / Equipment Home Assistive Devices/Equipment: Environmental consultant (specify type) Home Equipment: Agricultural consultant (2 wheels), Rollator (4 wheels), Cane - single point, Tub bench, Hand held shower head   Prior Device Use: Indicate devices/aids used by the patient prior to current illness, exacerbation or injury? Walker   Current Functional Level Cognition   Overall Cognitive Status:  Impaired/Different from baseline Orientation Level: Oriented X4 Following Commands: Follows one step commands consistently, Follows multi-step commands inconsistently Safety/Judgement: Decreased awareness of safety General Comments: much more alert and participatory than previous session    Extremity Assessment (includes Sensation/Coordination)   Upper Extremity Assessment: Generalized weakness  Lower Extremity Assessment: Generalized  weakness     ADLs   Overall ADL's : Needs assistance/impaired Eating/Feeding: Set up Grooming: Set up General ADL Comments: Pt declines OOB activity this session. OT anticipates pt needing mod A for LB self care tasks and set up A - min A for UB.     Mobility   Overal bed mobility: Needs Assistance Bed Mobility: Rolling Rolling: Min assist, Min guard Supine to sit: Mod assist Sit to supine: Mod assist General bed mobility comments: Min guard for rolling to the right and Min A for rolling to the left. cues for technique to increase independence by using bed rail to facilitate turns. patient declined getting out of bed due to needing to drink contrast for her upcoming CT scan. encouraged chair position in bed and/or getting out of bed later today with staff assistance to promote upright conditioning     Transfers   Overall transfer level: Needs assistance Equipment used: Rolling walker (2 wheels) Transfers: Sit to/from Stand Sit to Stand: Mod assist, From elevated surface Bed to/from chair/wheelchair/BSC transfer type:: Step pivot Step pivot transfers: Min assist General transfer comment: lifting and lowering assistance provided. cues for hand placement with increased time and effort required with all activity. 2 bouts of standing performed. post-op shoe on the left foot, closed shoe on the right     Ambulation / Gait / Stairs / Wheelchair Mobility   Ambulation/Gait Ambulation/Gait assistance: Editor, commissioning (Feet): 1 Feet Assistive device: Rolling walker (2 wheels) General Gait Details: cues for upright posture. patient able to take 2 small side steps to the right before needing to sit due to urination Gait velocity: decreased Pre-gait activities: patient able to take 2 small steps along edge of bed with hand held assistance. activity tolerance limited by fatigue     Posture / Balance Dynamic Sitting Balance Sitting balance - Comments: posterior lean initially that  improved with increased sitting time with Min A required. progressing to supervision for safety Balance Overall balance assessment: Needs assistance Sitting-balance support: Feet supported Sitting balance-Leahy Scale: Fair Sitting balance - Comments: posterior lean initially that improved with increased sitting time with Min A required. progressing to supervision for safety Postural control: Posterior lean Standing balance support: Bilateral upper extremity supported Standing balance-Leahy Scale: Poor Standing balance comment: external support required with rolling walker for support in standing     Special needs/care consideration      Signed       Show:Clear all [x] Written[x] Templated[] Copied   Added by: [x] McNichol, Bonney Aid, RN   [] Hover for details WOC Nurse Consult Note: Reason for Consult:Left foot ulceration, left hallux, distal tip, seen and followed by Dr. Excell Seltzer (Podiatric Medicine) in the community. Last seen in Dr. Bernette Redbird office on 01/05/23. We will continue the POC in place by Dr. Excell Seltzer. Wound type:Neuropathic, diabetic Pressure Injury POA: N/A Measurement:0.3cm round x 0.1cm per Nursing flow sheet Wound OVF:IEPP, pale Drainage (amount, consistency, odor) scant serosanguinous Periwound:intact Dressing procedure/placement/frequency: Daily wound care is for cleanse with NS,k dry and apply bacitracin ointment, top with dry gauze and secure with Coban self adhering wrap and apply a buttress. In house, we will secure with Kerlix roll gauze/paper tape. Coban and buttre4ss  can resume upon discharge with husband assisting with home wound care. He has been instructed in this per Dr. Bernette Redbird last note.   If further direction or evaluation is desired, recommend consulting Dr. Excell Seltzer.   WOC nursing team will not follow, but will remain available to this patient, the nursing and medical teams.  Please re-consult if needed.   Thank you for inviting [place] to participate in this  patient's Plan of Care.   Ladona Mow, MSN, RN, CNS, GNP, CWOCN, CWON-AP, WOCNF, [employer] phone:  [phone]                  Weekly paracentesis on Wednesdays and weekly blood transfusions        Previous Home Environment  Living Arrangements: Spouse/significant other  Lives With: Spouse Available Help at Discharge: Family, Available 24 hours/day Type of Home: House Home Layout: One level Home Access: Stairs to enter Entrance Stairs-Rails: None Entrance Stairs-Number of Steps: 2 small steps Bathroom Shower/Tub: Engineer, manufacturing systems: Standard Bathroom Accessibility: Yes How Accessible: Accessible via walker Home Care Services: Other (Comment) Additional Comments: 5/22 at [facility] with [facility] arranged   Discharge Living Setting Plans for Discharge Living Setting: Patient's home, Lives with (comment) (spouse) Type of Home at Discharge: House Discharge Home Layout: One level Discharge Home Access: Stairs to enter Entrance Stairs-Rails: None Entrance Stairs-Number of Steps: 2 small steps Discharge Bathroom Shower/Tub: Tub/shower unit Discharge Bathroom Toilet: Standard Discharge Bathroom Accessibility: Yes How Accessible: Accessible via walker Does the patient have any problems obtaining your medications?: No   Social/Family/Support Systems Patient Roles: Spouse Contact Information: spouse, [name] Anticipated Caregiver: spouse Anticipated Industrial/product designer Information: see contacts Ability/Limitations of Caregiver: no limitations Caregiver Availability: 24/7 Discharge Plan Discussed with Primary Caregiver: Yes Is Caregiver In Agreement with Plan?: Yes Does Caregiver/Family have Issues with Lodging/Transportation while Pt is in Rehab?: No   Goals Patient/Family Goal for Rehab: supervision with PT and OT Expected length of stay: ELOS 10 to 12 days Pt/Family Agrees to Admission and willing to participate: Yes Program Orientation  Provided & Reviewed with Pt/Caregiver Including Roles  & Responsibilities: Yes   Decrease burden of Care through IP rehab admission: n/a   Possible need for SNF placement upon discharge:not anticipated   Patient Condition: This patient's medical and functional status has changed since the consult dated: 01/16/23 in which the Rehabilitation Physician determined and documented that the patient's condition is appropriate for intensive rehabilitative care in an inpatient rehabilitation facility. See "History of Present Illness" (above) for medical update. Functional changes are: Pt. Mod A with transfers. Patient's medical and functional status update has been discussed with the Rehabilitation physician and patient remains appropriate for inpatient rehabilitation. Will admit to inpatient rehab today.   Preadmission Screen Completed By:  Clois Dupes, RN MSN 01/16/2023 4:51 PM ______________________________________________________________________   Discussed status with Dr. Wynn Banker on 01/19/23 at 10:00 and received approval for admission today.   Admission Coordinator:  Clois Dupes RN MSN time 10:00 Dorna Bloom [phone]

## 2023-01-19 NOTE — Care Management Important Message (Signed)
Important Message  Patient Details  Name: Rachel Huber MRN: [id number] Date of Birth: May 25, 1956   Medicare Important Message Given:  Yes     Olegario Messier A Nazier Neyhart 01/19/2023, 12:15 PM

## 2023-01-19 NOTE — Discharge Summary (Signed)
Physician Discharge Summary  Rachel Huber ZOX:[id number] DOB: [birth year] DOA: 01/06/2023  PCP: Lauro Regulus, MD  Admit date: 01/06/2023 Discharge date: 01/19/2023  Admitted From: Home Disposition:  CIR  Recommendations for Outpatient Follow-up:  Follow up with PCP in 1-2 weeks   Home Health:No Equipment/Devices:None   Discharge Condition:Stable  CODE STATUS:FULL  Diet recommendation: Reg  Brief/Interim Summary: 67 y.o. 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 Elby Showers of IR, and was found have worsening anemia.  Patient currently needing almost weekly transfusions.    Patient started developing fevers post blood transfusion 5/29.  Unclear etiology.  Questionable blood transfusion reaction.  Infectious workup in progress.   Fever broke 5/31.  Suspect that mild splenic infarction precipitated fever.  No signs of acute infection.    Discharge Diagnoses:  Principal Problem:   Anemia Active Problems:   Liver cirrhosis secondary to NASH [facility])   Splenic sequestration   Thrombocytopenia (HCC)   Internal hemorrhoid   Chronic kidney disease, stage 3b (HCC)   Hypotension   Depression with anxiety  New onset fever Noted on 5/29 after blood transfusion.  Unclear etiology.  Infectious workup in progress. Procalcitonin elevated to 1.5, downtrending Urine not obviously infected CT abd without infectious focus ??small splenic infarction Plan: Fevers have broke.  Patient has not received antibiotics for 36 hours.  I do not believe the fever was infectious in nature.   There is a strong possibility that this is mild fever due to splenic infarction.  Okay for discharge.  No antibiotics indicated.  Acute on chronic anemia Pancytopenia History of thalassemia minor Has had chronic anemia actually pancytopenia and has been following up oncology.  There are multiple factors for anemia including bleeding from internal hemorrhoids as well as splenic sequestration.  Requiring multiple transfusions  Anemia panel with anemia of chronic disease and no specific deficiency. Plan: No indication for transfusion   Liver cirrhosis secondary to NASH (HCC) Recurrent ascites Acute hepatic encephalopathy Patient with history of decompensated liver cirrhosis and recurrent ascites.  S/p paracentesis with removal of 4 L of fluid. Patient with increased somnolence and ammonia level of 69-concerning of hepatic encephalopathy.  Started on lactulose enema as she was refusing to take p.o. worsening T. bili, recently has a procedure Apparently was only taking MiraLAX at home as needed. Paracentesis done on 5/30.  5 L off Plan: Continue oral lactulose Continue torsemide and Aldactone   Splenic sequestration S/P partial splenic embolization on 5/21 by IR. Worsening leukocytosis and T. bili -per IR it was anticipated. --started on Zosyn due to fever and worsening leukocytosis -- Holding augmentin.  Suspicion that fever driven by splenic infarct --No antibiotics indicated on discharge   Thrombocytopenia [facility]) Patient has chronic pancytopenia.  Multifactorial with chronic disease, liver cirrhosis and splenic sequestration.     Internal hemorrhoid Apparently these have been ? bleeding requiring multiple transfusions for patient.  Monitoring clinically.  Outpatient follow-up with GI.  Consider possible surgical referral for banding   Chronic kidney disease, stage 3b (HCC) Appears to be at baseline. -Monitor renal function   Hypotension -Continue home midodrine   Depression with  anxiety -Continue home sertraline  Discharge Instructions  Discharge  Instructions     Diet - low sodium heart healthy   Complete by: As directed    Increase activity slowly   Complete by: As directed    No wound care   Complete by: As directed    Wound care  Daily      Comments: Wound care to left hallux, distal tip:  Cleanse with NS, pat dry. Apply a thin layer of bacitracin ointment, top with dry gauze and secure with a few turns of Kerlix roll gauze/paper tape. Change daily and PRN dressing dislodgement.      Allergies as of 01/19/2023       Reactions   Gramineae Pollens Other (See Comments)   Sneezing, Running nose   Latex Rash   Contact rash        Medication List     STOP taking these medications    chlorhexidine 0.12 % solution Commonly known as: PERIDEX   ciprofloxacin 500 MG tablet Commonly known as: CIPRO   cyanocobalamin 1000 MCG tablet Commonly known as: VITAMIN B12   fluconazole 150 MG tablet Commonly known as: DIFLUCAN   HYDROcodone-acetaminophen 5-325 MG tablet Commonly known as: NORCO/VICODIN   midodrine 10 MG tablet Commonly known as: PROAMATINE       TAKE these medications    acetaminophen 650 MG CR tablet Commonly known as: TYLENOL Take 1,300 mg by mouth daily as needed for pain.   albuterol 108 (90 Base) MCG/ACT inhaler Commonly known as: VENTOLIN HFA Inhale 2 puffs into the lungs every 6 (six) hours as needed for wheezing or shortness of breath.   atorvastatin 10 MG tablet Commonly known as: LIPITOR Take 1 tablet (10 mg total) by mouth daily.   diphenhydrAMINE 25 MG tablet Commonly known as: BENADRYL Take 25 mg by mouth 2 (two) times daily as needed for itching.   ergocalciferol 1.25 MG (50000 UT) capsule Commonly known as: VITAMIN D2 Take 1 capsule (50,000 Units total) by mouth every 14 (fourteen) days.   gabapentin 400 MG capsule Commonly known as: NEURONTIN Take 400 mg by mouth 2 (two) times daily.   lactulose 10  GM/15ML solution Commonly known as: CHRONULAC Take 30 mLs (20 g total) by mouth 2 (two) times daily.   melatonin 5 MG Tabs Take 10 mg by mouth at bedtime as needed (sleep).   multivitamin with minerals tablet Take 1 tablet by mouth daily.   oxymetazoline 0.05 % nasal spray Commonly known as: AFRIN Place 1 spray into both nostrils daily as needed for congestion.   polyethylene glycol 17 g packet Commonly known as: MIRALAX / GLYCOLAX Take 17 g by mouth daily as needed for mild constipation. What changed:  when to take this reasons to take this   sertraline 50 MG tablet Commonly known as: ZOLOFT Take 50 mg by mouth daily.   spironolactone 25 MG tablet Commonly known as: ALDACTONE Take 1 tablet (25 mg total) by mouth daily. What changed: how much to take   Torsemide 40 MG Tabs Take 40 mg by mouth daily.        Allergies  Allergen Reactions   Gramineae Pollens Other (See Comments)    Sneezing, Running nose   Latex Rash    Contact rash    Consultations: GI   Procedures/Studies: CT ABDOMEN PELVIS WO CONTRAST  Result Date: 01/16/2023 CLINICAL DATA:  67 year old history decompensated MASH cirrhosis status post TIPS creation and partial splenic embolization in addition to multiple superior rectal artery embolizations for hemorrhagic hemorrhoids. The patient remains admitted  after partial splenic embolization on 01/06/2023, with fevers after blood transfusion. EXAM: CT ABDOMEN AND PELVIS WITHOUT CONTRAST TECHNIQUE: Multidetector CT imaging of the abdomen and pelvis was performed following the standard protocol without IV contrast. RADIATION DOSE REDUCTION: This exam was performed according to the departmental dose-optimization program which includes automated exposure control, adjustment of the mA and/or kV according to patient size and/or use of iterative reconstruction technique. COMPARISON:  01/08/2023, 01/06/2023, 09/18/2012 FINDINGS: Lower chest: Trace bilateral pleural  effusions, left greater than right, similar to comparison. Similar cardiomegaly. Hepatobiliary: Similar appearing shrunken right lobe with left and caudate lobe hypertrophy. Nodular contour. Tips endograft in place, unchanged. Gallbladder is present with vicarious excretion of contrast and multiple punctate calcific gallstones. No gallbladder wall thickening. Pancreas: Unremarkable. No pancreatic ductal dilatation or surrounding inflammatory changes. Spleen: Geographic hypoattenuation inferior aspect of the spleen, no evidence of internal some collections. Adrenals/Urinary Tract: Adrenal glands are unremarkable. Similar appearing punctate nonobstructive left anterior interpolar renal calculus. Kidneys are otherwise normal, without new renal calculi, focal lesion, or hydronephrosis. Bladder is unremarkable. Stomach/Bowel: Stomach is within normal limits. Appendix is not. No evidence of bowel wall thickening, distention, or inflammatory changes. Vascular/Lymphatic: Similar appearing postprocedural changes after bilateral superior rectal coil embolization and left inferior polar splenic embolization. Aortic atherosclerosis. No enlarged abdominal or pelvic lymph nodes. Definitively identified Reproductive: Status post hysterectomy. No adnexal masses. Other: Small volume ascites. Diffuse body wall anasarca. Small fluid containing umbilical hernia. Musculoskeletal: No acute osseous abnormality. Multilevel degenerative changes of the lumbar spine. IMPRESSION: 1. No acute abnormality in the abdomen or pelvis to explain leukocytosis. 2. Postprocedural changes after inferior pole partial splenic embolization with expected splenic parenchymal hypoattenuation. No evidence of abscess formation. 3. Similar appearing morphologic changes of cirrhosis and portal hypertension despite presence of indwelling TIPS including splenomegaly and small volume ascites. 4. Diffuse anasarca. 5. Trace bilateral pleural effusions, left greater  than right. 6. Unchanged cardiomegaly. 7. Cholelithiasis without definite evidence cholecystitis. Marliss Coots, MD [facility] Radiology Electronically Signed   By: Marliss Coots M.D.   On: 01/16/2023 10:57   US Paracentesis  Result Date: 01/16/2023 INDICATION: History of NASH cirrhosis with recurrent ascites despite TIPS creation and revision performed at outside hospital. Patient presents for therapeutic paracentesis. EXAM: ULTRASOUND GUIDED right lower quadrant therapeutic PARACENTESIS MEDICATIONS: 15 mL 1% lidocaine COMPLICATIONS: None immediate. PROCEDURE: Informed written consent was obtained from the patient after a discussion of the risks, benefits and alternatives to treatment. A timeout was performed prior to the initiation of the procedure. Initial ultrasound scanning demonstrates a large amount of ascites within the right lower abdominal quadrant. The right lower abdomen was prepped and draped in the usual sterile fashion. 1% lidocaine was used for local anesthesia. Following this, a 6 Fr Safe-T-Centesis catheter was introduced. An ultrasound image was saved for documentation purposes. The paracentesis was performed. The catheter was removed and a dressing was applied. The patient tolerated the procedure well without immediate post procedural complication. Patient received post-procedure intravenous albumin; see nursing notes for details. FINDINGS: A total of approximately 5 L of golden fluid was removed. Ordering provider did not request laboratory samples. IMPRESSION: Successful ultrasound-guided paracentesis yielding 5 liters of peritoneal fluid. PLAN: Patient has previously undergone TIPS procedure and revision at outside hospital. Procedure performed by Mina Marble, PA-C Electronically Signed   By: Corlis Leak M.D.   On: 01/16/2023 07:32   DG ABD ACUTE 2+V W 1V CHEST  Result Date: 01/15/2023 CLINICAL DATA:  Paracentesis, fever EXAM: DG  ABDOMEN ACUTE WITH 1 VIEW CHEST COMPARISON:  01/08/2023 FINDINGS: Right Port-A-Cath remains in place, unchanged. Cardiomegaly. No confluent airspace opacities or effusions. Nonobstructive bowel gas pattern. No organomegaly, free air or suspicious calcification. IMPRESSION: No evidence of bowel obstruction or free air. Cardiomegaly.  No acute cardiopulmonary disease. Electronically Signed   By: Charlett Nose M.D.   On: 01/15/2023 03:50   CT ABDOMEN PELVIS WO CONTRAST  Result Date: 01/08/2023 CLINICAL DATA:  67 y.o. 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 Elby Showers of IR, and was found have worsening anemia. EXAM: CT ABDOMEN AND PELVIS WITHOUT CONTRAST TECHNIQUE: Multidetector CT imaging of the abdomen and pelvis was performed following the standard protocol without IV contrast. RADIATION DOSE REDUCTION: This exam was performed according to the departmental dose-optimization program which includes automated exposure control, adjustment of the mA and/or kV according to patient size and/or use of iterative reconstruction technique. COMPARISON:  CT abdomen and pelvis 09/18/2022 FINDINGS: Lower chest: Small left-greater-than-right pleural effusions. Associated compressive atelectasis. Hepatobiliary: TIPS. Nodular contour of the liver compatible with cirrhosis. Cholelithiasis. No biliary dilation. Pancreas: Unremarkable. Spleen: Embolization coils about the splenic hilum. Splenomegaly measuring 17.5 cm in craniocaudal dimension, previously 16.9 cm. Adrenals/Urinary Tract: Stable adrenal glands. No urinary  calculi or hydronephrosis. Stomach/Bowel: Normal caliber large and small bowel. No definite wall thickening. Decompressed stomach. Vascular/Lymphatic: Aortic atherosclerosis. No enlarged abdominal or pelvic lymph nodes. Reproductive: Status post hysterectomy. No adnexal masses. Other: Moderate low-density abdominopelvic ascites. No free intraperitoneal air. Body wall anasarca. Musculoskeletal: No acute abnormality. IMPRESSION: 1. Small left-greater-than-right pleural effusions with associated compressive atelectasis. 2. Cirrhotic liver with TIPS. 3. Splenomegaly, increased from 09/18/2022. 4. Moderate abdominopelvic ascites. Body wall anasarca. 5. Cholelithiasis. Aortic Atherosclerosis (ICD10-I70.0). Electronically Signed   By: Minerva Fester M.D.   On: 01/08/2023 18:25   DG Chest 2 View  Result Date: 01/08/2023 CLINICAL DATA:  Fever EXAM: CHEST - 2 VIEW COMPARISON:  11/17/2022 FINDINGS: Frontal and lateral views of the chest demonstrates stable enlargement of the cardiac silhouette. There is increased pulmonary vascular congestion, with interval development of interstitial and ground-glass opacities throughout the lungs. Small bilateral pleural effusions. No pneumothorax. Right chest wall port tip overlies superior vena cava. IMPRESSION: 1. Interval development of interstitial and ground-glass opacities throughout the lungs. This could be secondary to volume overload or infection. 2. Small bilateral pleural effusions. 3. Enlarged cardiac silhouette, with increased central vascular congestion. Electronically Signed   By: Sharlet Salina M.D.   On: 01/08/2023 17:12   IR EMBO TUMOR ORGAN ISCHEMIA INFARCT INC GUIDE ROADMAPPING  Result Date: 01/06/2023 INDICATION: Mrs. Gouge is a 67 year old female with 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 She  underwent "emborrhoid" procedure on 02/07/22 which significantly improved her rectal bleeding initially which gradually returned. She underwent repeat angiogram and embolization of the duplicated bilateral superior rectal arteries on 09/05/22 due to persistent anemia requiring transfusions. Again, she underwent repeat superior rectal embolization on 11/05/22 by my partner, Dr. Archer Asa. Despite this third embolization of the superior rectal arteries, she continues to  have rectal bleeding, exacerbated by large volume ascites. She presents today for paracentesis and partial splenic embolization. EXAM: 1. Ultrasound-guided paracentesis. 2. Ultrasound-guided vascular access of the left radial artery. 3. Catheterization and angiography of the celiac, splenic, and inferior polar splenic branches. 4. Particle and coil embolization of the inferior pole splenic artery branch. MEDICATIONS: None. ANESTHESIA/SEDATION: Moderate (conscious) sedation was employed during this procedure. A total of Versed 2 mg and Fentanyl 100 mcg was administered intravenously. Moderate Sedation Time: 76 minutes. The patient's level of consciousness and vital signs were monitored continuously by radiology nursing throughout the procedure under my direct supervision. CONTRAST:  OMNIPAQUE IOHEXOL 300 MG/ML  SOLN FLUOROSCOPY: Radiation Exposure Index (as provided by the fluoroscopic device): 1,139 mGy Kerma COMPLICATIONS: None immediate. PROCEDURE: Informed consent was obtained from the patient following explanation of the procedure, risks, benefits and alternatives. The patient understands, agrees and consents for the procedure. All questions were addressed. A time out was performed prior to the initiation of the procedure. Maximal barrier sterile technique utilized including caps, mask, sterile gowns, sterile gloves, large sterile drape, hand hygiene, and Betadine prep. Preprocedure ultrasound evaluation of the right upper quadrant demonstrated  moderate to large volume ascites. The right upper quadrant was prepped and draped in standard fashion. Subdermal Local anesthesia was provided at the planned needle entry site. A small skin nick was made. Under direct ultrasound visualization, a 10 cm Yueh needle was introduced into the peritoneal cavity. A total of approximately 4 L of translucent, straw-colored fluid was aspirated throughout the procedure. The catheter was then removed and a sterile bandage was applied. The left wrist was prepped and draped in standard fashion. Pulse oximeter was attached to the left thumb. Tora Perches test was performed, grade A. The left radial artery measured 0.20 cm in diameter. Subdermal Local anesthesia was provided at the planned needle entry site with 1% lidocaine. A small skin nick was made. Under direct ultrasound visualization, the left radial artery was punctured with a 21 gauge micropuncture needle. A permanent image was captured and stored in the record. A microwire was placed and exchanged for a 4/5 French slender sheath. The sheath was flushed followed by installation of standard radial cocktail. Under direct fluoroscopic guidance, a 5 Fr MG1 catheter with indwelling Benson wire was directed in retrograde fashion through the left upper extremity arteries to the level of the aortic arch, through the descending thoracic aorta, and into the proximal abdominal aorta. The celiac artery was selected. Celiac angiogram demonstrated patency and conventional anatomy. The proximal left splenic artery was then selected. Splenic angiogram was performed which demonstrated patency of the splenic artery with the inferior polar branch supplying approximately 40-50% of the total splenic volume. A Renegade hi Flo microcatheter and fathom 16 microwire were then used to select the inferior polar branch. Inferior polar branch angiogram was performed which demonstrated no evidence of collateralized nontarget arterial branches. Next, under  intermittent fluoroscopic guidance, embolization of the inferior polar splenic artery branch was performed with 600 micrometer (3 vials) followed by 800 micrometer (1 vial) Hydropearls. Repeat angiogram demonstrated was near complete stasis. Next, a 6 mm Ruby pot micro coil was then deployed. Completion splenic angiogram demonstrated complete embolization of the inferior polar branch artery. The catheters were removed. A TR band was applied to the left radial access site in the sheath was removed. The patient tolerated the procedure well was transferred to the recovery area in good condition. Proved preprocedure laboratory values demonstrated hemoglobin of 5.5, therefore request  was made to the Hospitalist service for admission for transfusion and pain control. IMPRESSION: 1. Technically successful particle and coil embolization of the inferior polar splenic artery with resulting embolization approximately 40-50% of the total splenic volume. 2. Technically successful ultrasound-guided paracentesis yielding 4 L ascites. PLAN: Follow-up in [facility] in 1 month. Marliss Coots, MD [facility] Radiology Electronically Signed   By: Marliss Coots M.D.   On: 01/06/2023 13:48   IR Paracentesis  Result Date: 01/06/2023 INDICATION: Mrs. Fitting is a 67 year old female with 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 She underwent "emborrhoid" procedure on 02/07/22 which significantly improved her rectal bleeding initially which gradually returned. She underwent repeat angiogram and embolization of the duplicated bilateral superior rectal arteries on 09/05/22 due to persistent anemia requiring transfusions. Again, she underwent repeat superior rectal embolization on 11/05/22 by my  partner, Dr. Archer Asa. Despite this third embolization of the superior rectal arteries, she continues to have rectal bleeding, exacerbated by large volume ascites. She presents today for paracentesis and partial splenic embolization. EXAM: 1. Ultrasound-guided paracentesis. 2. Ultrasound-guided vascular access of the left radial artery. 3. Catheterization and angiography of the celiac, splenic, and inferior polar splenic branches. 4. Particle and coil embolization of the inferior pole splenic artery branch. MEDICATIONS: None. ANESTHESIA/SEDATION: Moderate (conscious) sedation was employed during this procedure. A total of Versed 2 mg and Fentanyl 100 mcg was administered intravenously. Moderate Sedation Time: 76 minutes. The patient's level of consciousness and vital signs were monitored continuously by radiology nursing throughout the procedure under my direct supervision. CONTRAST:  OMNIPAQUE IOHEXOL 300 MG/ML  SOLN FLUOROSCOPY: Radiation Exposure Index (as provided by the fluoroscopic device): 1,139 mGy Kerma COMPLICATIONS: None immediate. PROCEDURE: Informed consent was obtained from the patient following explanation of the procedure, risks, benefits and alternatives. The patient understands, agrees and consents for the procedure. All questions were addressed. A time out was performed prior to the initiation of the procedure. Maximal barrier sterile technique utilized including caps, mask, sterile gowns, sterile gloves, large sterile drape, hand hygiene, and Betadine prep. Preprocedure ultrasound evaluation of the right upper quadrant demonstrated moderate to large volume ascites. The right upper quadrant was prepped and draped in standard fashion. Subdermal Local anesthesia was provided at the planned needle entry site. A small skin nick was made. Under direct ultrasound visualization, a 10 cm Yueh needle was introduced into the peritoneal cavity. A total of approximately 4 L of translucent,  straw-colored fluid was aspirated throughout the procedure. The catheter was then removed and a sterile bandage was applied. The left wrist was prepped and draped in standard fashion. Pulse oximeter was attached to the left thumb. Tora Perches test was performed, grade A. The left radial artery measured 0.20 cm in diameter. Subdermal Local anesthesia was provided at the planned needle entry site with 1% lidocaine. A small skin nick was made. Under direct ultrasound visualization, the left radial artery was punctured with a 21 gauge micropuncture needle. A permanent image was captured and stored in the record. A microwire was placed and exchanged for a 4/5 French slender sheath. The sheath was flushed followed by installation of standard radial cocktail. Under direct fluoroscopic guidance, a 5 Fr MG1 catheter with indwelling Benson wire was directed in retrograde fashion through the left upper extremity arteries to the level of the aortic arch,  through the descending thoracic aorta, and into the proximal abdominal aorta. The celiac artery was selected. Celiac angiogram demonstrated patency and conventional anatomy. The proximal left splenic artery was then selected. Splenic angiogram was performed which demonstrated patency of the splenic artery with the inferior polar branch supplying approximately 40-50% of the total splenic volume. A Renegade hi Flo microcatheter and fathom 16 microwire were then used to select the inferior polar branch. Inferior polar branch angiogram was performed which demonstrated no evidence of collateralized nontarget arterial branches. Next, under intermittent fluoroscopic guidance, embolization of the inferior polar splenic artery branch was performed with 600 micrometer (3 vials) followed by 800 micrometer (1 vial) Hydropearls. Repeat angiogram demonstrated was near complete stasis. Next, a 6 mm Ruby pot micro coil was then deployed. Completion splenic angiogram demonstrated complete  embolization of the inferior polar branch artery. The catheters were removed. A TR band was applied to the left radial access site in the sheath was removed. The patient tolerated the procedure well was transferred to the recovery area in good condition. Proved preprocedure laboratory values demonstrated hemoglobin of 5.5, therefore request was made to the Hospitalist service for admission for transfusion and pain control. IMPRESSION: 1. Technically successful particle and coil embolization of the inferior polar splenic artery with resulting embolization approximately 40-50% of the total splenic volume. 2. Technically successful ultrasound-guided paracentesis yielding 4 L ascites. PLAN: Follow-up in [facility] in 1 month. Marliss Coots, MD [facility] Radiology Electronically Signed   By: Marliss Coots M.D.   On: 01/06/2023 13:48   US Paracentesis  Result Date: 12/30/2022 INDICATION: 67 year old female. History of NASH cirrhosis with recurrent ascites despite tips creation and revision performed at outside hospital. Patient presents for therapeutic paracentesis. Maximum 8 L EXAM: ULTRASOUND GUIDED THERAPEUTIC PARACENTESIS MEDICATIONS: Lidocaine 1% 10 mL COMPLICATIONS: None immediate. PROCEDURE: Informed written consent was obtained from the patient after a discussion of the risks, benefits and alternatives to treatment. A timeout was performed prior to the initiation of the procedure. Initial ultrasound scanning demonstrates a moderate amount of ascites within the right lower abdominal quadrant. The right lower abdomen was prepped and draped in the usual sterile fashion. 1% lidocaine was used for local anesthesia. Following this, a 6 Fr Safe-T-Centesis catheter was introduced. An ultrasound image was saved for documentation purposes. The paracentesis was performed. The catheter was removed and a dressing was applied. The  patient tolerated the procedure well without immediate post procedural complication. Patient received post-procedure intravenous albumin; see nursing notes for details. FINDINGS: A total of approximately 5.7 L of straw-colored fluid was removed. IMPRESSION: Successful ultrasound-guided therapeutic paracentesis yielding 5.7 liters of peritoneal fluid. Performed by: Anders Grant, NP Electronically Signed   By: Irish Lack M.D.   On: 12/30/2022 12:32   US Paracentesis  Result Date: 12/23/2022 INDICATION: 67 year old female. History of NASH cirrhosis with recurrent ascites despite TIPS creation and revision performed at outside hospital. Patient presents for therapeutic paracentesis. Maximum 8 L EXAM: ULTRASOUND GUIDED THERAPEUTIC  PARACENTESIS MEDICATIONS: None. COMPLICATIONS: None immediate. PROCEDURE: Informed written consent was obtained from the patient after a discussion of the risks, benefits and alternatives to treatment. A timeout was performed prior to the initiation of the procedure. Initial ultrasound scanning demonstrates a large amount of ascites within the right lower abdominal quadrant. The right lower abdomen was prepped and draped in the usual sterile fashion. 1% lidocaine was used for local anesthesia. Following this, a 6 Fr Safe-T-Centesis catheter was  introduced. An ultrasound image was saved for documentation purposes. The paracentesis was performed. The catheter was removed and a dressing was applied. The patient tolerated the procedure well without immediate post procedural complication. FINDINGS: A total of approximately 6.3 L of straw-colored fluid was removed. IMPRESSION: Successful ultrasound-guided therapeutic paracentesis yielding 6.3 liters of peritoneal fluid. Read by: Anders Grant, NP Electronically Signed   By: Olive Bass M.D.   On: 12/23/2022 13:07      Subjective: Seen and examined on day of discharge.  Stable no distress.  Appropriate for discharge to  CIR  Discharge Exam: Vitals:   01/19/23 0442 01/19/23 0807  BP: (!) 118/44 (!) 111/48  Pulse: 78 75  Resp: 18 18  Temp: 99.5 F (37.5 C) 98.9 F (37.2 C)  SpO2: 97% 95%   Vitals:   01/18/23 1601 01/18/23 2137 01/19/23 0442 01/19/23 0807  BP: (!) 114/52 (!) 135/45 (!) 118/44 (!) 111/48  Pulse: 71 76 78 75  Resp: 18 18 18 18   Temp: 98 F (36.7 C) 98.5 F (36.9 C) 99.5 F (37.5 C) 98.9 F (37.2 C)  TempSrc:    Oral  SpO2: 100% 96% 97% 95%  Weight:      Height:        General: Pt is alert, awake, not in acute distress Cardiovascular: RRR, S1/S2 +, no rubs, no gallops Respiratory: CTA bilaterally, no wheezing, no rhonchi Abdominal: Soft, NT, ND, bowel sounds + Extremities: no edema, no cyanosis    The results of significant diagnostics from this hospitalization (including imaging, microbiology, ancillary and laboratory) are listed below for reference.     Microbiology: Recent Results (from the past 240 hour(s))  Culture, blood (x 2)     Status: None (Preliminary result)   Collection Time: 01/15/23 12:07 AM   Specimen: BLOOD  Result Value Ref Range Status   Specimen Description BLOOD BLOOD LEFT HAND  Final   Special Requests   Final    BOTTLES DRAWN AEROBIC AND ANAEROBIC Blood Culture adequate volume   Culture   Final    NO GROWTH 4 DAYS Performed at [facility], [address] [place], [place] [address]    Report Status PENDING  Incomplete  Culture, blood (Routine X 2) w Reflex to ID Panel     Status: None (Preliminary result)   Collection Time: 01/15/23  1:05 AM   Specimen: BLOOD  Result Value Ref Range Status   Specimen Description BLOOD RIGHT HAND  Final   Special Requests   Final    BOTTLES DRAWN AEROBIC AND ANAEROBIC Blood Culture adequate volume   Culture   Final    NO GROWTH 4 DAYS Performed at [facility], [address] [place], [place] [address]    Report Status PENDING  Incomplete     Labs: BNP (last 3 results) Recent  Labs    11/17/22 0114  BNP 302.0*   Basic Metabolic Panel: Recent Labs  Lab 01/13/23 0550 01/14/23 0515 01/15/23 0007 01/16/23 0558 01/18/23 0812  NA 135 135 132* 133* 131*  K 3.4* 3.7 3.5 3.4* 3.2*  CL 108 108 105 106 100  CO2 21* 21* 22 21* 24  GLUCOSE 150* 150* 208* 147* 111*  BUN 55* 52* 47* 41* 40*  CREATININE 1.73* 1.71* 1.60* 1.41* 1.35*  CALCIUM 7.7* 7.5* 7.6* 7.5* 7.3*  MG 1.7 1.6*  --   --   --    Liver Function Tests: Recent Labs  Lab 01/15/23 0007 01/16/23 0558  AST  36 41  ALT 20 23  ALKPHOS 116 105  BILITOT 2.2* 1.3*  PROT 4.8* 4.3*  ALBUMIN 2.2* 1.8*   No results for input(s): "LIPASE", "AMYLASE" in the last 168 hours. No results for input(s): "AMMONIA" in the last 168 hours. CBC: Recent Labs  Lab 01/13/23 0550 01/14/23 0515 01/14/23 1757 01/15/23 0007 01/15/23 1200 01/16/23 0558 01/18/23 0812  WBC 11.5* 9.3  --  7.7  --  8.1 7.5  NEUTROABS  --   --   --  5.4  --  6.1 5.3  HGB 7.6* 7.1* 8.6* 8.6* 8.6* 8.7* 8.3*  HCT 23.1* 22.4* 26.4* 26.5*  --  27.0* 26.1*  MCV 74.0* 75.2*  --  75.1*  --  74.0* 72.9*  PLT 218 247  --  276  --  289 298   Cardiac Enzymes: No results for input(s): "CKTOTAL", "CKMB", "CKMBINDEX", "TROPONINI" in the last 168 hours. BNP: Invalid input(s): "POCBNP" CBG: No results for input(s): "GLUCAP" in the last 168 hours. D-Dimer No results for input(s): "DDIMER" in the last 72 hours. Hgb A1c No results for input(s): "HGBA1C" in the last 72 hours. Lipid Profile No results for input(s): "CHOL", "HDL", "LDLCALC", "TRIG", "CHOLHDL", "LDLDIRECT" in the last 72 hours. Thyroid function studies No results for input(s): "TSH", "T4TOTAL", "T3FREE", "THYROIDAB" in the last 72 hours.  Invalid input(s): "FREET3" Anemia work up No results for input(s): "VITAMINB12", "FOLATE", "FERRITIN", "TIBC", "IRON", "RETICCTPCT" in the last 72 hours. Urinalysis    Component Value Date/Time   COLORURINE YELLOW (A) 01/14/2023 2211    APPEARANCEUR CLEAR (A) 01/14/2023 2211   LABSPEC 1.009 01/14/2023 2211   PHURINE 5.0 01/14/2023 2211   GLUCOSEU NEGATIVE 01/14/2023 2211   HGBUR NEGATIVE 01/14/2023 2211   BILIRUBINUR NEGATIVE 01/14/2023 2211   KETONESUR NEGATIVE 01/14/2023 2211   PROTEINUR NEGATIVE 01/14/2023 2211   NITRITE NEGATIVE 01/14/2023 2211   LEUKOCYTESUR TRACE (A) 01/14/2023 2211   Sepsis Labs Recent Labs  Lab 01/14/23 0515 01/15/23 0007 01/16/23 0558 01/18/23 0812  WBC 9.3 7.7 8.1 7.5   Microbiology Recent Results (from the past 240 hour(s))  Culture, blood (x 2)     Status: None (Preliminary result)   Collection Time: 01/15/23 12:07 AM   Specimen: BLOOD  Result Value Ref Range Status   Specimen Description BLOOD BLOOD LEFT HAND  Final   Special Requests   Final    BOTTLES DRAWN AEROBIC AND ANAEROBIC Blood Culture adequate volume   Culture   Final    NO GROWTH 4 DAYS Performed at [facility], [address] [place], [place] [address]    Report Status PENDING  Incomplete  Culture, blood (Routine X 2) w Reflex to ID Panel     Status: None (Preliminary result)   Collection Time: 01/15/23  1:05 AM   Specimen: BLOOD  Result Value Ref Range Status   Specimen Description BLOOD RIGHT HAND  Final   Special Requests   Final    BOTTLES DRAWN AEROBIC AND ANAEROBIC Blood Culture adequate volume   Culture   Final    NO GROWTH 4 DAYS Performed at [facility], [address], [place], [place] [address]    Report Status PENDING  Incomplete     Time coordinating discharge: Over 30 minutes  SIGNED:   Tresa Moore, MD  [facility] 01/19/2023, 10:20 AM Pager   If 7PM-7AM, please contact night-coverage

## 2023-01-19 NOTE — Procedures (Signed)
PROCEDURE SUMMARY:  Successful US guided paracentesis from right lateral abdomen.  Yielded 5.0 liters of clear, yellow fluid.  No immediate complications.  Pt tolerated well.   Specimen was sent for labs.  EBL < 5mL  Hoyt Koch PA-C 01/19/2023 4:28 PM

## 2023-01-19 NOTE — TOC Transition Note (Signed)
Transition of Care [facility]) - CM/SW Discharge Note   Patient Details  Name: Rachel Huber MRN: [id number] Date of Birth: 1956/02/11  Transition of [facility]) CM/SW Contact:  Margarito Liner, LCSW Phone Number: 01/19/2023, 10:31 AM   Clinical Narrative:  Patient has orders to discharge to [facility] today. Transport paperwork is on the chart. No further concerns. CSW signing off.   Final next level of care: IP Rehab Facility Barriers to Discharge: Barriers Resolved   Patient Goals and CMS Choice CMS Medicare.gov Compare Post Acute Care list provided to:: Patient    Discharge Placement                  Patient to be transferred to facility by: [facility]   Patient and family notified of of transfer: 01/19/23  Discharge Plan and Services Additional resources added to the After Visit Summary for       Post Acute Care Choice: IP Rehab (CIR pending full assessment.)                    HH Arranged: RN          Social Determinants of Health (SDOH) Interventions SDOH Screenings   Food Insecurity: Patient Declined (01/06/2023)  Housing: Patient Declined (01/06/2023)  Transportation Needs: Patient Declined (01/06/2023)  Utilities: Patient Declined (01/06/2023)  Alcohol Screen: Low Risk  (04/04/2022)  Depression (PHQ2-9): Low Risk  (04/04/2022)  Financial Resource Strain: Low Risk  (04/04/2022)  Physical Activity: Inactive (04/04/2022)  Social Connections: Moderately Isolated (04/04/2022)  Stress: No Stress Concern Present (04/04/2022)  Tobacco Use: Low Risk  (01/09/2023)     Readmission Risk Interventions    12/19/2022   10:27 AM 11/04/2022   11:32 AM 10/17/2022    9:17 AM  Readmission Risk Prevention Plan  Transportation Screening Complete Complete Complete  Medication Review Oceanographer) Complete Complete Complete  PCP or Specialist appointment within 3-5 days of discharge Complete Complete   HRI or Home Care Consult Complete  Complete  SW Recovery  Care/Counseling Consult Complete Complete Complete  Palliative Care Screening Not Applicable Not Applicable Not Applicable  Skilled Nursing Facility Not Applicable Not Applicable Not Applicable

## 2023-01-20 ENCOUNTER — Ambulatory Visit: Payer: Medicare Other

## 2023-01-20 ENCOUNTER — Inpatient Hospital Stay: Payer: Medicare Other

## 2023-01-20 DIAGNOSIS — K7682 Hepatic encephalopathy: Secondary | ICD-10-CM | POA: Diagnosis not present

## 2023-01-20 LAB — CBC WITH DIFFERENTIAL/PLATELET
Abs Immature Granulocytes: 0.04 10*3/uL (ref 0.00–0.07)
Basophils Absolute: 0 10*3/uL (ref 0.0–0.1)
Basophils Relative: 0 %
Eosinophils Absolute: 0.3 10*3/uL (ref 0.0–0.5)
Eosinophils Relative: 4 %
HCT: 26 % — ABNORMAL LOW (ref 36.0–46.0)
Hemoglobin: 8.3 g/dL — ABNORMAL LOW (ref 12.0–15.0)
Immature Granulocytes: 1 %
Lymphocytes Relative: 17 %
Lymphs Abs: 1.4 10*3/uL (ref 0.7–4.0)
MCH: 23.1 pg — ABNORMAL LOW (ref 26.0–34.0)
MCHC: 31.9 g/dL (ref 30.0–36.0)
MCV: 72.2 fL — ABNORMAL LOW (ref 80.0–100.0)
Monocytes Absolute: 1 10*3/uL (ref 0.1–1.0)
Monocytes Relative: 12 %
Neutro Abs: 5.5 10*3/uL (ref 1.7–7.7)
Neutrophils Relative %: 66 %
Platelets: 314 10*3/uL (ref 150–400)
RBC: 3.6 MIL/uL — ABNORMAL LOW (ref 3.87–5.11)
RDW: 22.3 % — ABNORMAL HIGH (ref 11.5–15.5)
WBC: 8.3 10*3/uL (ref 4.0–10.5)
nRBC: 0 % (ref 0.0–0.2)

## 2023-01-20 LAB — GLUCOSE, CAPILLARY
Glucose-Capillary: 96 mg/dL (ref 70–99)
Glucose-Capillary: 188 mg/dL — ABNORMAL HIGH (ref 70–99)

## 2023-01-20 LAB — COMPREHENSIVE METABOLIC PANEL
ALT: 47 U/L — ABNORMAL HIGH (ref 0–44)
AST: 77 U/L — ABNORMAL HIGH (ref 15–41)
Albumin: 1.6 g/dL — ABNORMAL LOW (ref 3.5–5.0)
Alkaline Phosphatase: 162 U/L — ABNORMAL HIGH (ref 38–126)
Anion gap: 8 (ref 5–15)
BUN: 36 mg/dL — ABNORMAL HIGH (ref 8–23)
CO2: 25 mmol/L (ref 22–32)
Calcium: 7.7 mg/dL — ABNORMAL LOW (ref 8.9–10.3)
Chloride: 99 mmol/L (ref 98–111)
Creatinine, Ser: 1.32 mg/dL — ABNORMAL HIGH (ref 0.44–1.00)
GFR, Estimated: 45 mL/min — ABNORMAL LOW (ref >60)
Glucose, Bld: 106 mg/dL — ABNORMAL HIGH (ref 70–99)
Potassium: 3.9 mmol/L (ref 3.5–5.1)
Sodium: 132 mmol/L — ABNORMAL LOW (ref 135–145)
Total Bilirubin: 1.4 mg/dL — ABNORMAL HIGH (ref 0.3–1.2)
Total Protein: 4.1 g/dL — ABNORMAL LOW (ref 6.5–8.1)

## 2023-01-20 LAB — CULTURE, BLOOD (ROUTINE X 2)
Culture: NO GROWTH
Special Requests: ADEQUATE

## 2023-01-20 LAB — PATHOLOGIST SMEAR REVIEW: Path Review: NEGATIVE

## 2023-01-20 LAB — AMMONIA: Ammonia: 82 umol/L — ABNORMAL HIGH (ref 9–35)

## 2023-01-20 MED ORDER — VITAMIN D (ERGOCALCIFEROL) 1.25 MG (50000 UNIT) PO CAPS
50000.0000 [IU] | ORAL_CAPSULE | ORAL | Status: DC
  Administered 2023-01-20: 50000 [IU] via ORAL
  Filled 2023-01-20: qty 1

## 2023-01-20 MED ORDER — POTASSIUM CHLORIDE CRYS ER 10 MEQ PO TBCR
10.0000 meq | EXTENDED_RELEASE_TABLET | Freq: Once | ORAL | Status: AC
  Administered 2023-01-20: 10 meq via ORAL
  Filled 2023-01-20: qty 1

## 2023-01-20 NOTE — Progress Notes (Signed)
Physical Therapy Session Note  Patient Details  Name: Rachel Huber MRN: [id number] Date of Birth: August 13, 1956  Today's Date: 01/20/2023 PT Individual Time: 1400-1456 PT Individual Time Calculation (min): 56 min   Short Term Goals: Week 1:  PT Short Term Goal 1 (Week 1): STG's=LTG's PT Short Term Goal 1 - Progress (Week 1): Progressing toward goal  Skilled Therapeutic Interventions/Progress Updates:   Received pt semi-reclined in bed and denied any pain during session. Pt slow to initiate movement and lethargic, falling in/out of sleep mid conversation and reporting fatigue from previous therapies - became more awake once upright. Session with emphasis on functional mobility/transfers, toileting, generalized strengthening and endurance, and ambulation. Pt reported urge to void and transferred semi-reclined<>sitting EOB with HOB elevated and use of bedrails with supervision. Stood from elevated EOB with RW and CGA and ambulated in/out of bathroom with RW and CGA. Pt able to manage clothing with CGA and void and perform hygiene management without assist. Sat in WC and ate lunch (half of chicken tender) with set up assist while discussing pt's current difficulty with sustaining focus to tasks. Pt required significantly increased time to eat and assist to open cans - pt externally distracted getting phone call from family during session. Pt requested to return to bed and stood from [facility] with RW and CGA and ambulated back to bed. Transferred sit<>supine with supervision and scooted to [facility] pulling with BUE support on headboard and use of Trendelenburg bed position. Removed shoes and pt performed x10 SLR bilaterally and concluded session with pt semi-reclined in bed, needs within reach, and bed alarm on.   Therapy Documentation Precautions:  Restrictions Weight Bearing Restrictions: No  Therapy/Group: Individual Therapy Channing Amparan [name] PT, DPT 01/20/2023, 7:24 AM

## 2023-01-20 NOTE — Plan of Care (Signed)
  Problem: RH Balance Goal: LTG Patient will maintain dynamic standing with ADLs (OT) Description: LTG:  Patient will maintain dynamic standing balance with assist during activities of daily living (OT)  Flowsheets (Taken 01/20/2023 1217) LTG: Pt will maintain dynamic standing balance during ADLs with: Independent with assistive device   Problem: Sit to Stand Goal: LTG:  Patient will perform sit to stand in prep for activites of daily living with assistance level (OT) Description: LTG:  Patient will perform sit to stand in prep for activites of daily living with assistance level (OT) Flowsheets (Taken 01/20/2023 1217) LTG: PT will perform sit to stand in prep for activites of daily living with assistance level: Independent with assistive device   Problem: RH Bathing Goal: LTG Patient will bathe all body parts with assist levels (OT) Description: LTG: Patient will bathe all body parts with assist levels (OT) Flowsheets (Taken 01/20/2023 1217) LTG: Pt will perform bathing with assistance level/cueing: Supervision/Verbal cueing   Problem: RH Dressing Goal: LTG Patient will perform upper body dressing (OT) Description: LTG Patient will perform upper body dressing with assist, with/without cues (OT). Flowsheets (Taken 01/20/2023 1217) LTG: Pt will perform upper body dressing with assistance level of: Independent with assistive device Goal: LTG Patient will perform lower body dressing w/assist (OT) Description: LTG: Patient will perform lower body dressing with assist, with/without cues in positioning using equipment (OT) Flowsheets (Taken 01/20/2023 1217) LTG: Pt will perform lower body dressing with assistance level of: Supervision/Verbal cueing   Problem: RH Toileting Goal: LTG Patient will perform toileting task (3/3 steps) with assistance level (OT) Description: LTG: Patient will perform toileting task (3/3 steps) with assistance level (OT)  Flowsheets (Taken 01/20/2023 1217) LTG: Pt will perform  toileting task (3/3 steps) with assistance level: Supervision/Verbal cueing   Problem: RH Toilet Transfers Goal: LTG Patient will perform toilet transfers w/assist (OT) Description: LTG: Patient will perform toilet transfers with assist, with/without cues using equipment (OT) Flowsheets (Taken 01/20/2023 1217) LTG: Pt will perform toilet transfers with assistance level of: Independent with assistive device   Problem: RH Tub/Shower Transfers Goal: LTG Patient will perform tub/shower transfers w/assist (OT) Description: LTG: Patient will perform tub/shower transfers with assist, with/without cues using equipment (OT) Flowsheets (Taken 01/20/2023 1217) LTG: Pt will perform tub/shower stall transfers with assistance level of: Supervision/Verbal cueing

## 2023-01-20 NOTE — Progress Notes (Signed)
Inpatient Rehabilitation Care Coordinator Assessment and Plan Patient Details  Name: Rachel Huber MRN: [id number] Date of Birth: 09-23-1955  Today's Date: 01/20/2023  Hospital Problems: Principal Problem:   Acute hepatic encephalopathy [facility])  Past Medical History:  Past Medical History:  Diagnosis Date   Anemia in chronic kidney disease (CKD) 02/07/2022   Anxiety    Arthritis    Ascites of liver    Weekly paracentesis   Cirrhosis of liver (HCC)    CKD (chronic kidney disease) stage 4, GFR 15-29 ml/min (HCC)    GERD (gastroesophageal reflux disease)    Grade IV internal hemorrhoids    Hypertension    NASH (nonalcoholic steatohepatitis)    Neuropathy, diabetic (HCC)    Obesity (BMI 30-39.9)    OSA on CPAP    Pneumonia    Portal hypertension (HCC)    S/P TIPS (transjugular intrahepatic portosystemic shunt)    Thalassemia minor 1992   Past Surgical History:  Past Surgical History:  Procedure Laterality Date   ABDOMINAL HYSTERECTOMY     BREAST BIOPSY Right 02/15/2018   [place] bx 6-6:30 ribbon shape, ONE CORE FRAGMENT WITH FIBROSIS. ONE CORE FRAGMENT WITH PORTION OF A DILATED   BREAST BIOPSY Right 02/15/2018   [place] bx 9:00 heart shape, USUAL DUCTAL HYPERPLASIA   BREAST LUMPECTOMY Right 03/09/2018   Procedure: BREAST LUMPECTOMY x 2;  Surgeon: Sung Amabile, DO;  Location: [facility] ORS;  Service: General;  Laterality: Right;   CATARACT EXTRACTION W/PHACO Left 06/11/2016   Procedure: CATARACT EXTRACTION PHACO AND INTRAOCULAR LENS PLACEMENT (IOC);  Surgeon: Sallee Lange, MD;  Location: [facility] ORS;  Service: Ophthalmology;  Laterality: Left;  Lot # [id number] H US:01:38.6 AP%:26.4 CDE:44.15   CATARACT EXTRACTION W/PHACO Right 06/15/2018   Procedure: CATARACT EXTRACTION PHACO AND INTRAOCULAR LENS PLACEMENT (IOC);  Surgeon: Galen Manila, MD;  Location: [facility] OR[facility];  Service: Ophthalmology;  Laterality: Right;  [place] 00:38.2 CDE 4.23 Fluid Pack Lot # [id number]   COLONOSCOPIES     COLONOSCOPY  WITH PROPOFOL N/A 10/10/2020   Procedure: COLONOSCOPY WITH PROPOFOL;  Surgeon: Regis Bill, MD;  Location: [facility] ENDOSCOPY;  Service: Endoscopy;  Laterality: N/A;   COLONOSCOPY WITH PROPOFOL N/A 11/20/2020   Procedure: COLONOSCOPY WITH PROPOFOL;  Surgeon: Regis Bill, MD;  Location: [facility] ENDOSCOPY;  Service: Endoscopy;  Laterality: N/A;  DM STAT CBC, BMP COVID POSITIVE 09/02/2020   COLONOSCOPY WITH PROPOFOL N/A 06/19/2022   Procedure: COLONOSCOPY WITH PROPOFOL;  Surgeon: Regis Bill, MD;  Location: [facility] ENDOSCOPY;  Service: Endoscopy;  Laterality: N/A;   CTR     ESOPHAGOGASTRODUODENOSCOPY (EGD) WITH PROPOFOL N/A 10/10/2020   Procedure: ESOPHAGOGASTRODUODENOSCOPY (EGD) WITH PROPOFOL;  Surgeon: Regis Bill, MD;  Location: [facility] ENDOSCOPY;  Service: Endoscopy;  Laterality: N/A;  COVID POSITIVE 10/08/2020   FLEXIBLE SIGMOIDOSCOPY N/A 02/06/2022   Procedure: FLEXIBLE SIGMOIDOSCOPY;  Surgeon: Regis Bill, MD;  Location: [facility] ENDOSCOPY;  Service: Endoscopy;  Laterality: N/A;  Patient requests anesthesia   FLEXIBLE SIGMOIDOSCOPY N/A 12/19/2022   Procedure: FLEXIBLE SIGMOIDOSCOPY;  Surgeon: Dolores Frame, MD;  Location: [facility];  Service: Gastroenterology;  Laterality: N/A;   IR ANGIOGRAM SELECTIVE EACH ADDITIONAL VESSEL  02/07/2022   IR ANGIOGRAM SELECTIVE EACH ADDITIONAL VESSEL  02/07/2022   IR ANGIOGRAM SELECTIVE EACH ADDITIONAL VESSEL  09/05/2022   IR ANGIOGRAM VISCERAL SELECTIVE  02/07/2022   IR ANGIOGRAM VISCERAL SELECTIVE  09/05/2022   IR EMBO ART  VEN HEMORR LYMPH EXTRAV  INC GUIDE ROADMAPPING  09/05/2022   IR EMBO ART  VEN  HEMORR LYMPH EXTRAV  INC GUIDE ROADMAPPING  11/05/2022   IR EMBO ARTERIAL NOT HEMORR HEMANG INC GUIDE ROADMAPPING  02/07/2022   IR EMBO TUMOR ORGAN ISCHEMIA INFARCT INC GUIDE ROADMAPPING  01/06/2023   IR IMAGING GUIDED PORT INSERTION  06/13/2022   IR IMAGING GUIDED PORT INSERTION  11/25/2022   IR PARACENTESIS  12/17/2021   IR PARACENTESIS   03/25/2022   IR PARACENTESIS  11/25/2022   IR PARACENTESIS  01/06/2023   IR PARACENTESIS  01/19/2023   IR RADIOLOGIST EVAL & MGMT  03/18/2022   IR RADIOLOGIST EVAL & MGMT  05/07/2022   IR RADIOLOGIST EVAL & MGMT  05/16/2022   IR RADIOLOGIST EVAL & MGMT  10/13/2022   IR RADIOLOGIST EVAL & MGMT  12/12/2022   IR US GUIDE VASC ACCESS RIGHT  02/07/2022   IR US GUIDE VASC ACCESS RIGHT  09/05/2022   JOINT REPLACEMENT     KNEE SURGERY Right 09/24/2017   plates and pins   PORT-A-CATH REMOVAL N/A 09/23/2022   Procedure: MINOR REMOVAL PORT-A-CATH;  Surgeon: Franky Macho, MD;  Location: [facility];  Service: General;  Laterality: N/A;   TEE WITHOUT CARDIOVERSION N/A 09/23/2022   Procedure: TRANSESOPHAGEAL ECHOCARDIOGRAM (TEE);  Surgeon: Antoine Poche, MD;  Location: [facility];  Service: Endoscopy;  Laterality: N/A;   TOTAL SHOULDER ARTHROPLASTY Right 09/27/2015   Social History:  reports that she has never smoked. She has never used smokeless tobacco. She reports that she does not drink alcohol and does not use drugs.  Family / Support Systems Marital Status: Married Patient Roles: Spouse Spouse/Significant Other: Junie Spencer, spouse Children: n/a Other Supports: Lucky Cowboy, sister Anticipated Caregiver: spouse Ability/Limitations of Caregiver: none Caregiver Availability: 24/7 Family Dynamics: support from spoue and sister  Social History Preferred language: English Religion: Christian Cultural Background: patient limited within the community due to health safety Education: hs Health Literacy - How often do you need to have someone help you when you read instructions, pamphlets, or other written material from your doctor or pharmacy?: Never Writes: Yes Employment Status: Disabled Marine scientist Issues: n/a Guardian/Conservator: n/a   Abuse/Neglect Abuse/Neglect Assessment Can Be Completed: Yes Physical Abuse: Denies Verbal Abuse: Denies Sexual Abuse: Denies Exploitation of  patient/patient's resources: Denies Self-Neglect: Denies  Patient response to: Social Isolation - How often do you feel lonely or isolated from those around you?: Never  Emotional Status Pt's affect, behavior and adjustment status: Plesant, currently chilly requesting a blanket Recent Psychosocial Issues: coping Psychiatric History: hx of anxiety. Substance Abuse History: n/a  Patient / Family Perceptions, Expectations & Goals Pt/Family understanding of illness & functional limitations: yes Premorbid pt/family roles/activities: Independent with RW/Rollator and receiving assistance with ADLS from spouse Anticipated changes in roles/activities/participation: Patient anticpating supervision and ADL assist from spouse Pt/family expectations/goals: Supervision  [employer] Agencies: None Premorbid Home Care/DME Agencies: Other (Comment) (RW, Rollator, SPC,, TTB) Transportation available at discharge: spouse Is the patient able to respond to transportation needs?: Yes In the past 12 months, has lack of transportation kept you from medical appointments or from getting medications?: No In the past 12 months, has lack of transportation kept you from meetings, work, or from getting things needed for daily living?: No Resource referrals recommended: Neuropsychology  Discharge Planning Living Arrangements: Spouse/significant other Support Systems: Spouse/significant other Type of Residence: Private residence (1 level home, 2 steps) Insurance Resources: Electrical engineer Resources: Tree surgeon, SSD Financial Screen Referred: No Living Expenses: Lives with family Money Management: Patient, Spouse Does the patient  have any problems obtaining your medications?: No Home Management: Independent Patient/Family Preliminary Plans: Spouse able to assist if needed Care Coordinator Barriers to Discharge: Lack of/limited family support Care Coordinator Anticipated Follow Up  Needs: HH/OP DC Planning Additional Notes/Comments: Weekly blood influsion and Weekly paracentesis Expected length of stay: 10-12 Days  Clinical Impression SW met with patient, introduced self and explained role. Patient anticipates discharging home with spouse to assist and provide 24/7 supervision. Patient also has sister for support. Patient previously using [facility]. Patient has a RW, and Rollator. 1 level home, 2 steps to enter. No additional questions or concern.   Andria Rhein 01/20/2023, 1:58 PM

## 2023-01-20 NOTE — Progress Notes (Signed)
Inpatient Rehabilitation  Patient information reviewed and entered into eRehab system by Peighton Edgin M. Lalah Durango, M.A., CCC/SLP, PPS Coordinator.  Information including medical coding, functional ability and quality indicators will be reviewed and updated through discharge.    

## 2023-01-20 NOTE — Progress Notes (Signed)
Inpatient Rehabilitation Center Individual Statement of Services  Patient Name:  Rachel Huber  Date:  01/20/2023  Welcome to the Inpatient Rehabilitation Center.  Our goal is to provide you with an individualized program based on your diagnosis and situation, designed to meet your specific needs.  With this comprehensive rehabilitation program, you will be expected to participate in at least 3 hours of rehabilitation therapies Monday-Friday, with modified therapy programming on the weekends.  Your rehabilitation program will include the following services:  Physical Therapy (PT), Occupational Therapy (OT), Speech Therapy (ST), 24 hour per day rehabilitation nursing, Therapeutic Recreaction (TR), Neuropsychology, Care Coordinator, Rehabilitation Medicine, Nutrition Services, Pharmacy Services, and Other  Weekly team conferences will be held on Wednesdays to discuss your progress.  Your Inpatient Rehabilitation Care Coordinator will talk with you frequently to get your input and to update you on team discussions.  Team conferences with you and your family in attendance may also be held.  Expected length of stay:  10-12 Days  Overall anticipated outcome:  [place]  Depending on your progress and recovery, your program may change. Your Inpatient Rehabilitation Care Coordinator will coordinate services and will keep you informed of any changes. Your Inpatient Rehabilitation Care Coordinator's name and contact numbers are listed  below.  The following services may also be recommended but are not provided by the Inpatient Rehabilitation Center:   Home Health Rehabiltiation Services Outpatient Rehabilitation Services    Arrangements will be made to provide these services after discharge if needed.  Arrangements include referral to agencies that provide these services.  Your insurance has been verified to be:   Medicare A & B Your primary doctor is:  Einar Crow, MD  Pertinent information  will be shared with your doctor and your insurance company.  Inpatient Rehabilitation Care Coordinator:  Lavera Guise, [place] [phone] or [phone]  Information discussed with and copy given to patient by: Andria Rhein, 01/20/2023, 10:40 AM

## 2023-01-20 NOTE — Progress Notes (Signed)
Physical Therapy Assessment and Plan  Patient Details  Name: SIBEL STACHOWICZ MRN: [id number] Date of Birth: 05/19/1956  PT Diagnosis: Abnormality of gait, Difficulty walking, and Muscle weakness Rehab Potential: Excellent ELOS: 7 days   Today's Date: 01/20/2023 PT Individual Time: 4098-1191 PT Individual Time Calculation (min): 80 min    Hospital Problem: Principal Problem:   Acute hepatic encephalopathy (HCC)   Past Medical History:  Past Medical History:  Diagnosis Date   Anemia in chronic kidney disease (CKD) 02/07/2022   Anxiety    Arthritis    Ascites of liver    Weekly paracentesis   Cirrhosis of liver (HCC)    CKD (chronic kidney disease) stage 4, GFR 15-29 ml/min (HCC)    GERD (gastroesophageal reflux disease)    Grade IV internal hemorrhoids    Hypertension    NASH (nonalcoholic steatohepatitis)    Neuropathy, diabetic (HCC)    Obesity (BMI 30-39.9)    OSA on CPAP    Pneumonia    Portal hypertension (HCC)    S/P TIPS (transjugular intrahepatic portosystemic shunt)    Thalassemia minor 1992   Past Surgical History:  Past Surgical History:  Procedure Laterality Date   ABDOMINAL HYSTERECTOMY     BREAST BIOPSY Right 02/15/2018   [place] bx 6-6:30 ribbon shape, ONE CORE FRAGMENT WITH FIBROSIS. ONE CORE FRAGMENT WITH PORTION OF A DILATED   BREAST BIOPSY Right 02/15/2018   [place] bx 9:00 heart shape, USUAL DUCTAL HYPERPLASIA   BREAST LUMPECTOMY Right 03/09/2018   Procedure: BREAST LUMPECTOMY x 2;  Surgeon: Sung Amabile, DO;  Location: [facility];  Service: General;  Laterality: Right;   CATARACT EXTRACTION W/PHACO Left 06/11/2016   Procedure: CATARACT EXTRACTION PHACO AND INTRAOCULAR LENS PLACEMENT (IOC);  Surgeon: Sallee Lange, MD;  Location: [facility];  Service: Ophthalmology;  Laterality: Left;  Lot # [id number] H US:01:38.6 AP%:26.4 CDE:44.15   CATARACT EXTRACTION W/PHACO Right 06/15/2018   Procedure: CATARACT EXTRACTION PHACO AND INTRAOCULAR LENS PLACEMENT (IOC);   Surgeon: Galen Manila, MD;  Location: [facility];  Service: Ophthalmology;  Laterality: Right;  [place] 00:38.2 CDE 4.23 Fluid Pack Lot # [id number]   COLONOSCOPIES     COLONOSCOPY WITH PROPOFOL N/A 10/10/2020   Procedure: COLONOSCOPY WITH PROPOFOL;  Surgeon: Regis Bill, MD;  Location: [facility] END[facility]COPY;  Service: Endoscopy;  Laterality: N/A;   COLONOSCOPY WITH PROPOFOL N/A 11/20/2020   Procedure: COLONOSCOPY WITH PROPOFOL;  Surgeon: Regis Bill, MD;  Location: [facility] ENDOSCOPY;  Service: Endoscopy;  Laterality: N/A;  DM STAT CBC, BMP COVID POSITIVE 09/02/2020   COLONOSCOPY WITH PROPOFOL N/A 06/19/2022   Procedure: COLONOSCOPY WITH PROPOFOL;  Surgeon: Regis Bill, MD;  Location: [facility] ENDOSCOPY;  Service: Endoscopy;  Laterality: N/A;   CTR     ESOPHAGOGASTRODUODENOSCOPY (EGD) WITH PROPOFOL N/A 10/10/2020   Procedure: ESOPHAGOGASTRODUODENOSCOPY (EGD) WITH PROPOFOL;  Surgeon: Regis Bill, MD;  Location: [facility] ENDOSCOPY;  Service: Endoscopy;  Laterality: N/A;  COVID POSITIVE 10/08/2020   FLEXIBLE SIGMOIDOSCOPY N/A 02/06/2022   Procedure: FLEXIBLE SIGMOIDOSCOPY;  Surgeon: Regis Bill, MD;  Location: [facility] ENDOSCOPY;  Service: Endoscopy;  Laterality: N/A;  Patient requests anesthesia   FLEXIBLE SIGMOIDOSCOPY N/A 12/19/2022   Procedure: FLEXIBLE SIGMOIDOSCOPY;  Surgeon: Dolores Frame, MD;  Location: [facility];  Service: Gastroenterology;  Laterality: N/A;   IR ANGIOGRAM SELECTIVE EACH ADDITIONAL VESSEL  02/07/2022   IR ANGIOGRAM SELECTIVE EACH ADDITIONAL VESSEL  02/07/2022   IR ANGIOGRAM SELECTIVE EACH ADDITIONAL VESSEL  09/05/2022   IR ANGIOGRAM VISCERAL SELECTIVE  02/07/2022  IR ANGIOGRAM VISCERAL SELECTIVE  09/05/2022   IR EMBO ART  VEN HEMORR LYMPH EXTRAV  INC GUIDE ROADMAPPING  09/05/2022   IR EMBO ART  VEN HEMORR LYMPH EXTRAV  INC GUIDE ROADMAPPING  11/05/2022   IR EMBO ARTERIAL NOT HEMORR HEMANG INC GUIDE ROADMAPPING  02/07/2022   IR EMBO TUMOR ORGAN ISCHEMIA  INFARCT INC GUIDE ROADMAPPING  01/06/2023   IR IMAGING GUIDED PORT INSERTION  06/13/2022   IR IMAGING GUIDED PORT INSERTION  11/25/2022   IR PARACENTESIS  12/17/2021   IR PARACENTESIS  03/25/2022   IR PARACENTESIS  11/25/2022   IR PARACENTESIS  01/06/2023   IR PARACENTESIS  01/19/2023   IR RADIOLOGIST EVAL & MGMT  03/18/2022   IR RADIOLOGIST EVAL & MGMT  05/07/2022   IR RADIOLOGIST EVAL & MGMT  05/16/2022   IR RADIOLOGIST EVAL & MGMT  10/13/2022   IR RADIOLOGIST EVAL & MGMT  12/12/2022   IR US GUIDE VASC ACCESS RIGHT  02/07/2022   IR US GUIDE VASC ACCESS RIGHT  09/05/2022   JOINT REPLACEMENT     KNEE SURGERY Right 09/24/2017   plates and pins   PORT-A-CATH REMOVAL N/A 09/23/2022   Procedure: MINOR REMOVAL PORT-A-CATH;  Surgeon: Franky Macho, MD;  Location: [facility];  Service: General;  Laterality: N/A;   TEE WITHOUT CARDIOVERSION N/A 09/23/2022   Procedure: TRANSESOPHAGEAL ECHOCARDIOGRAM (TEE);  Surgeon: Antoine Poche, MD;  Location: [facility];  Service: Endoscopy;  Laterality: N/A;   TOTAL SHOULDER ARTHROPLASTY Right 09/27/2015    Assessment & Plan Clinical Impression: HPI: JENEE RAZ is a 67 year old right-handed female with complicated medical history including type 2 diabetes mellitus, hypertension, NASH s/p TIPS revision at [facility], weekly paracentesis, recurrent grade 4 internal hemorrhoids s/p embolization of superior rectal artery 11/05/22, multifactorial anemia, OSA on CPAP, CKD III, 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 Recent admission 5/1 - 12/20/2022 from Hem/onc office for recurrent symptomatic anemia, received 4 units packed red blood cells and repeat Flex/sig showing poor prep with large external hems and splenic embolization recommended by Dr. Freda Munro.   She presented to [facility] 01/06/2023 for planned partial splenic embolization and paracentesis with 4 L yield 01/06/2023  per Dr. Elby Showers but was found to have worsening of anemia.   She was transfused with one units PRBC. She developed somnolence due hepatic encephalopathy and reports of refusing lactulose with rise in ammonia level of 69. She was started on lactulose enema as well as maintained on torsemide and Aldactone. Rise in WBC to 19,800 with low-grade fever noted and infectious work up negative. She was started on Zosyn transitioned to Augmentin and completed antibiotic  course 5/29.  She did have drop in Hgb to 6.8 on 05/27 and was transfused with one unit PRBC. She did develop fever up to 102-103 range on 05/30 which was felt to be due to transfusion reaction as UA negative and BC X 2 pend but negative so far. Leucocytosis and thrombocytopenia has resolved.    She has required ongoing paracentesis as needed with latest completed 01/14/2023 yielding 5 L of golden fluid.  Her latest ammonia level on 05/24 was 65 and she continues on scheduled lactulose.   She is tolerating a regular diet.    PTA- patient lives with spouse in  1 level home 2 steps to entry.  Ambulates with a 4 wheeled walker and limited endurance and husband assist with ADLs.  Therapy evaluations completed due to  patient decreased functional mobility was admitted for a comprehensive rehab program.  Patient currently requires min with mobility secondary to muscle weakness, decreased cardiorespiratoy endurance, impaired timing and sequencing and decreased coordination, and decreased standing balance, decreased postural control, and decreased balance strategies.  Prior to hospitalization, patient was modified independent  with rollator for limited household mobility and lived with Spouse in a House home.  Home access is 1 + 1 front doorStairs to enter.  Patient will benefit from skilled PT intervention to maximize safe functional mobility, minimize fall risk, and decrease caregiver burden for planned discharge home with 24 hour assist.  Anticipate patient  will benefit from follow up [facility] at discharge.  PT - End of Session Activity Tolerance: Tolerates 30+ min activity with multiple rests Endurance Deficit: Yes Endurance Deficit Description: muscular and cardiovascular endurance limited PT Assessment Rehab Potential (ACUTE/IP ONLY): Excellent PT Barriers to Discharge: Inaccessible home environment;Wound Care;Weight;Pending surgery (awaiting transplant list) PT Patient demonstrates impairments in the following area(s): Balance;Endurance;Pain;Perception;Safety;Skin Integrity PT Transfers Functional Problem(s): Bed Mobility;Bed to Chair;Car PT Locomotion Functional Problem(s): Ambulation;Stairs;Wheelchair Mobility PT Plan PT Intensity: Minimum of 1-2 x/day ,45 to 90 minutes PT Frequency: 5 out of 7 days PT Duration Estimated Length of Stay: 7 days PT Treatment/Interventions: Ambulation/gait training;Balance/vestibular training;Community reintegration;Discharge planning;Disease management/prevention;DME/adaptive equipment instruction;Functional electrical stimulation;Functional mobility training;Neuromuscular re-education;Pain management;Patient/family education;Psychosocial support;Skin care/wound management;Stair training;Therapeutic Activities;Therapeutic Exercise;Splinting/orthotics;Wheelchair propulsion/positioning;UE/LE Strength taining/ROM;UE/LE Coordination activities PT Transfers Anticipated Outcome(s): Mod I with LRAD PT Locomotion Anticipated Outcome(s): Mod I with LRAD PT Recommendation Follow Up Recommendations: Home health PT Patient destination: Home Equipment Recommended: 3 in 1 bedside comode;Rolling walker with 5" wheels Equipment Details: pt may progress to safey mobility with Rollator vs RW; will update as pt progresses   PT Evaluation Precautions/Restrictions Precautions Precautions: Fall Precaution Comments: post-op shoe on the left for reported skin injury-heel wound Restrictions Weight Bearing Restrictions: No RUE  Weight Bearing: Weight bearing as tolerated LUE Weight Bearing: Weight bearing as tolerated RLE Weight Bearing: Weight bearing as tolerated LLE Weight Bearing: Weight bearing as tolerated Other Position/Activity Restrictions: post op shoe on Lt foot General   Vital Signs Pain   Pain Interference Pain Interference Pain Effect on Sleep: 1. Rarely or not at all Pain Interference with Therapy Activities: 2. Occasionally Pain Interference with Day-to-Day Activities: 1. Rarely or not at all Home Living/Prior Functioning Home Living Available Help at Discharge: Family;Available 24 hours/day Type of Home: House Home Access: Stairs to enter [employer] of Steps: 1 + 1 front door Entrance Stairs-Rails: None Home Layout: One level Bathroom Shower/Tub: Forensic scientist: Standard (toilet rise with arm rests) Bathroom Accessibility: Yes Additional Comments: Has TTB, SPC and rollator, toilet seat raiser, arm rests on both side of toilet, manual w/c (previously her mother's); pt is going to try to get a lift chair, has adjustable bed at head and foot.  Lives With: Spouse Prior Function Level of Independence: Requires assistive device for independence;Needs assistance with ADLs Bath: Minimal Dressing: Minimal  Able to Take Stairs?: Yes Driving: No Vocation: Retired Gaffer: worked as a Designer, industrial/product at [employer]:  (reading, crafting-diamond dots,) Vision/Perception     Cognition Overall Cognitive Status: Within Functional Limits for tasks assessed Arousal/Alertness: Awake/alert Orientation Level: Oriented X4 Year: 2024 Month: June Day of Week: Correct Memory: Appears intact Awareness: Appears intact Problem Solving: Appears intact Comments: WFL's Sensation Sensation Light Touch: Appears Intact Hot/Cold: Not tested Proprioception: Appears Intact Additional Comments: hx of peripheral neuropathy bil Bil LE Coordination Gross  Motor  Movements are Fluid and Coordinated: No Fine Motor Movements are Fluid and Coordinated: Yes Coordination and Movement Description: generalized debility limiting coordination Heel Shin Test: intact Motor  Motor Motor: Within Functional Limits Motor - Skilled Clinical Observations: generalized weakness   Trunk/Postural Assessment  Cervical Assessment Cervical Assessment: Exceptions to [facility] Thoracic Assessment Thoracic Assessment: Exceptions to [facility] Lumbar Assessment Lumbar Assessment: Exceptions to [facility] Postural Control Postural Control: Deficits on evaluation Righting Reactions: delayed Protective Responses: delayed  Balance Balance Balance Assessed: Yes Standardized Balance Assessment Standardized Balance Assessment: Timed Up and Go Test Timed Up and Go Test TUG: Normal TUG Normal TUG (seconds): 81.26 (with RW) Static Sitting Balance Static Sitting - Balance Support: Feet supported;No upper extremity supported Static Sitting - Level of Assistance: 5: Stand by assistance Dynamic Sitting Balance Dynamic Sitting - Balance Support: No upper extremity supported;Feet unsupported Dynamic Sitting - Level of Assistance: 5: Stand by assistance Static Standing Balance Static Standing - Balance Support: Bilateral upper extremity supported;During functional activity Static Standing - Level of Assistance: 5: Stand by assistance Dynamic Standing Balance Dynamic Standing - Balance Support: Bilateral upper extremity supported;During functional activity Dynamic Standing - Level of Assistance: 4: Min assist Extremity Assessment      RLE Assessment RLE Assessment: Exceptions to [facility] Active Range of Motion (AROM) Comments: WFL's RLE Strength Right Hip Flexion: 4+/5 Right Hip ABduction: 4+/5 Right Hip ADduction: 4-/5 Right Knee Flexion: 4+/5 Right Knee Extension: 4+/5 Right Ankle Dorsiflexion: 4+/5 Right Ankle Plantar Flexion: 4-/5 LLE Assessment LLE Assessment: Exceptions to [facility] Active  Range of Motion (AROM) Comments: WFL's LLE Strength Left Hip Flexion: 4-/5 Left Hip ABduction: 4-/5 Left Hip ADduction: 4-/5 Left Knee Flexion: 4-/5 Left Knee Extension: 4-/5 Left Ankle Dorsiflexion:  (not tested due to wound and dressing) Left Ankle Plantar Flexion:  (not tested due to wound and dressing)  Care Tool Care Tool Bed Mobility Roll left and right activity   Roll left and right assist level: Supervision/Verbal cueing    Sit to lying activity   Sit to lying assist level: Supervision/Verbal cueing    Lying to sitting on side of bed activity   Lying to sitting on side of bed assist level: the ability to move from lying on the back to sitting on the side of the bed with no back support.: Supervision/Verbal cueing     Care Tool Transfers Sit to stand transfer   Sit to stand assist level: Minimal Assistance - Patient > 75%    Chair/bed transfer   Chair/bed transfer assist level: Minimal Assistance - Patient > 75%     Toilet transfer   Assist Level: Minimal Assistance - Patient > 75%    Car transfer   Car transfer assist level: Minimal Assistance - Patient > 75%      Care Tool Locomotion Ambulation   Assist level: Minimal Assistance - Patient > 75% Assistive device: Walker-rolling Max distance: 46  Walk 10 feet activity   Assist level: Minimal Assistance - Patient > 75% Assistive device: Walker-rolling   Walk 50 feet with 2 turns activity Walk 50 feet with 2 turns activity did not occur: Safety/medical concerns      Walk 150 feet activity Walk 150 feet activity did not occur: Safety/medical concerns      Walk 10 feet on uneven surfaces activity   Assist level: Minimal Assistance - Patient > 75% Assistive device: Walker-rolling  Stairs   Assist level: Moderate Assistance - Patient - 50 - 74% Stairs assistive device: 2 hand  rails Max number of stairs: 4 (6")  Walk up/down 1 step activity   Walk up/down 1 step (curb) assist level: Minimal Assistance -  Patient > 75% Walk up/down 1 step or curb assistive device: Walker  Walk up/down 4 steps activity   Walk up/down 4 steps assist level: Moderate Assistance - Patient - 50 - 74% Walk up/down 4 steps assistive device: 2 hand rails  Walk up/down 12 steps activity Walk up/down 12 steps activity did not occur: Safety/medical concerns      Pick up small objects from floor Pick up small object from the floor (from standing position) activity did not occur: Safety/medical concerns      Wheelchair Is the patient using a wheelchair?: No Type of Wheelchair: Manual Wheelchair activity did not occur: N/A      Wheel 50 feet with 2 turns activity Wheelchair 50 feet with 2 turns activity did not occur: N/A    Wheel 150 feet activity        Refer to Care Plan for Long Term Goals  SHORT TERM GOAL WEEK 1 PT Short Term Goal 1 (Week 1): STG's=LTG's  Recommendations for other services: None   Skilled Therapeutic Intervention Mobility Transfers Transfers: Stand to Sit;Sit to Stand;Stand Pivot Transfers Sit to Stand: Minimal Assistance - Patient > 75% Stand to Sit: Contact Guard/Touching assist Stand Pivot Transfers: Minimal Assistance - Patient > 75% Stand Pivot Transfer Details: Verbal cues for technique;Verbal cues for safe use of DME/AE Transfer (Assistive device): Rolling walker Locomotion  Gait Gait Distance (Feet): 46 Feet Assistive device: Rolling walker Gait Gait Pattern: Impaired Gait velocity: decr Stairs / Additional Locomotion Stairs: Yes Stairs Assistance: Moderate Assistance - Patient 50 - 74% Stair Management Technique: Two rails;Step to pattern;Forwards Number of Stairs: 4 Height of Stairs: 6 Ramp: Minimal Assistance - Patient >75% Curb: Minimal Assistance - Patient >75% (with RW)   PT eval completed addressing rehab process, PT purpose, POC, ELOS, and goals.  Pt received in [facility] and session began with isolated LE testing. Pt completed sit<>stand with RW and ambulated  `46' with RW into hallway, seated rest provided in [facility] due to fatigue. Pt has short low steps and flexed posture with gait, foot clearance limited due to decreased hip flexion and dorsiflexion bil. Pt completed CareTool mobility tasks of car transfer, ambulation on uneven ramped surface, stair mobility, curb negotiation and at EOS toileting. EOS Pt left in on toilet with NT present to assist back to bed. Pt instructed on use of call bell cord in bathroom as well. See functional navigator for further details regarding functional mobility performance.   Discharge Criteria: Patient will be discharged from PT if patient refuses treatment 3 consecutive times without medical reason, if treatment goals not met, if there is a change in medical status, if patient makes no progress towards goals or if patient is discharged from hospital.  The above assessment, treatment plan, treatment alternatives and goals were discussed and mutually agreed upon: by patient  Wynn Maudlin, DPT Acute Rehabilitation Services Office [phone]  01/20/23 12:34 PM

## 2023-01-20 NOTE — Progress Notes (Signed)
PROGRESS NOTE   Subjective/Complaints: No new complaints this morning Does not like the protein supplements Ok with the lactulose and discussed that she needs this for her hyperammonia   Objective: ROS: +does not like the protein supplements   IR Paracentesis  Result Date: 01/19/2023 INDICATION: 67 year old with refractory ascites s/p TIPS at OSH May 2023, now with recurrent ascites. Request made for diagnostic and therapeutic paracentesis. EXAM: ULTRASOUND GUIDED DIAGNOSTIC AND THERAPEUTIC PARACENTESIS MEDICATIONS: 10 mL 1% lidocaine COMPLICATIONS: None immediate. PROCEDURE: Informed written consent was obtained from the patient after a discussion of the risks, benefits and alternatives to treatment. A timeout was performed prior to the initiation of the procedure. Initial ultrasound scanning demonstrates a large amount of ascites within the right lower abdominal quadrant. The right lower abdomen was prepped and draped in the usual sterile fashion. 1% lidocaine with epinephrine was used for local anesthesia. Following this, a 19 gauge, 7-cm, Yueh catheter was introduced. An ultrasound image was saved for documentation purposes. The paracentesis was performed. The catheter was removed and a dressing was applied. The patient tolerated the procedure well without immediate post procedural complication. FINDINGS: A total of approximately 5.0 liters of clear, yellow fluid was removed. Samples were sent to the laboratory as requested by the clinical team. IMPRESSION: Successful ultrasound-guided paracentesis yielding 5.0 liters of peritoneal fluid. Performed by: Loyce Dys PA-C Electronically Signed   By: Marliss Coots M.D.   On: 01/19/2023 20:50   Recent Labs    01/18/23 0812 01/20/23 0316  WBC 7.5 8.3  HGB 8.3* 8.3*  HCT 26.1* 26.0*  PLT 298 314   Recent Labs    01/18/23 0812 01/20/23 0316  NA 131* 132*  K 3.2* 3.9  CL 100 99  CO2  24 25  GLUCOSE 111* 106*  BUN 40* 36*  CREATININE 1.35* 1.32*  CALCIUM 7.3* 7.7*    Intake/Output Summary (Last 24 hours) at 01/20/2023 1154 Last data filed at 01/19/2023 1847 Gross per 24 hour  Intake 240 ml  Output 60 ml  Net 180 ml        Physical Exam: Vital Signs Blood pressure (!) 132/56, pulse 80, temperature 98.5 F (36.9 C), temperature source Oral, resp. rate 17, SpO2 96 %. Gen: no distress, normal appearing HEENT: oral mucosa pink and moist, NCAT Cardio: Reg rate Chest: normal effort, normal rate of breathing Abd: soft, non-distended Ext: no edema Psych: pleasant, normal affect Skin:    General: Skin is warm and dry.     Comments: Dry flaky skin on BLE with tenting and stasis changes.   Neurological:     Mental Status: She is alert.     Comments: Slow to process with delayed output and needs redirection at times.         General: No acute distress Mood and affect are appropriate Heart: Regular rate and rhythm no rubs murmurs or extra sounds Lungs: Clear to auscultation, breathing unlabored, no rales or wheezes Abdomen: Positive bowel sounds, soft nontender to palpation, nondistended Extremities: No clubbing, cyanosis, or edema Skin: No evidence of breakdown, no evidence of rash Neurologic:response time  is slow but is good historian, oriented x 3  motor strength is 5/5 in bilateral deltoid, bicep, tricep, grip, hip flexor, knee extensors, ankle dorsiflexor and plantar flexor Sensory exam normal sensation to light touch and proprioception in bilateral upper and lower extremities No UE tremor  Musculoskeletal: Full range of motion in all 4 extremities. No joint swelling   Assessment/Plan: 1. Functional deficits which require 3+ hours per day of interdisciplinary therapy in a comprehensive inpatient rehab setting. Physiatrist is providing close team supervision and 24 hour management of active medical problems listed below. Physiatrist and rehab team continue to  assess barriers to discharge/monitor patient progress toward functional and medical goals  Care Tool:  Bathing    Body parts bathed by patient: Right arm, Left arm, Chest, Abdomen, Right upper leg, Left upper leg, Face   Body parts bathed by helper: Right lower leg, Left lower leg, Front perineal area, Buttocks     Bathing assist Assist Level: Maximal Assistance - Patient 24 - 49%     Upper Body Dressing/Undressing Upper body dressing   What is the patient wearing?: Pull over shirt    Upper body assist Assist Level: Minimal Assistance - Patient > 75%    Lower Body Dressing/Undressing Lower body dressing      What is the patient wearing?: Underwear/pull up, Pants     Lower body assist Assist for lower body dressing: Moderate Assistance - Patient 50 - 74%     Toileting Toileting    Toileting assist Assist for toileting: Maximal Assistance - Patient 25 - 49%     Transfers Chair/bed transfer  Transfers assist           Locomotion Ambulation   Ambulation assist              Walk 10 feet activity   Assist           Walk 50 feet activity   Assist           Walk 150 feet activity   Assist           Walk 10 feet on uneven surface  activity   Assist           Wheelchair     Assist               Wheelchair 50 feet with 2 turns activity    Assist            Wheelchair 150 feet activity     Assist          Blood pressure (!) 132/56, pulse 80, temperature 98.5 F (36.9 C), temperature source Oral, resp. rate 17, SpO2 96 %.  Medical Problem List and Plan: 1. Functional deficits secondary to acute hepatic encephalopathy s/p  splenic emobolization with recurrent anemia             -patient may  shower             -ELOS/Goals: 10-12d 2.  Antithrombotics: -DVT/anticoagulation:  Mechanical: Antiembolism stockings, thigh (TED hose) Bilateral lower extremities             -antiplatelet therapy: N/A 3.  Pain Management: Voltaren gel, Neurontin 400 mg twice daily 4. Mood/Behavior/Sleep: Zoloft 50 mg daily             -antipsychotic agents: N/A             --denies insomnia.  5. Neuropsych/cognition: This patient may be intermittently capable of making decisions on her own behalf.             --  reports cognition has improved in the past week.  6. Skin/Wound Care: Left foot ulceration followed by podiatry services.  Wound care as directed.  Routine skin checks 7. Fluids/Electrolytes/Nutrition: Routine in and outs with follow-up chemistries 8.  Acute on chronic anemia/pancytopenia: Hx of thalassemia intermedia, hems, s/p splenic embolization.    --S/p  multiple admission w/multiple transfusions --Iron infusion held due to concerns of overload due to  multiple transfusion.  Will check stool guaic  9.  CKD stage III.  Baseline SCr-  10.  NASH w/cirrhosis, Hems, ascites: Lactulose TID--has refused daily since 01/12/23.  --Add low salt restrictions and daily weights.   --Recheck ammonia level in am. --Continue torsemide and aldactone daily to help manage ascites.  --question recurrence of ascites. Will order ultrasound.  11.  Hyperlipidemia.  Now on Lipitor  12.  Chronic orthostasis. Midodrine was discontinued on 05/28 as was being held.  --Will order orthostatic BP. Add TED hose   13. Hyponatremia/Hypokalemia: supplement klor on 6/4, d/c scheduled supplement   14. Hyperammonia: discussed that level increased today and that lactulose will help with this  15. Suboptimal vitamin D with history of insufficiency: start ergocalciferol 50,000U once per week for 7 weeks  16. Hypoalbuminemia: encouraged consuming protein in her diet  LOS: 1 days A FACE TO FACE EVALUATION WAS PERFORMED  Clint Bolder P Daphna Lafuente 01/20/2023, 11:54 AM

## 2023-01-20 NOTE — Evaluation (Signed)
Occupational Therapy Assessment and Plan  Patient Details  Name: Rachel Huber MRN: [id number] Date of Birth: 11/30/1955  OT Diagnosis: abnormal posture, acute pain, muscle weakness (generalized), and swelling of limb Rehab Potential: Rehab Potential (ACUTE ONLY): Good ELOS: 7-9 days   Today's Date: 01/20/2023 OT Individual Time: 6578-4696 OT Individual Time Calculation (min): 75 min     Hospital Problem: Principal Problem:   Acute hepatic encephalopathy (HCC)   Past Medical History:  Past Medical History:  Diagnosis Date   Anemia in chronic kidney disease (CKD) 02/07/2022   Anxiety    Arthritis    Ascites of liver    Weekly paracentesis   Cirrhosis of liver (HCC)    CKD (chronic kidney disease) stage 4, GFR 15-29 ml/min (HCC)    GERD (gastroesophageal reflux disease)    Grade IV internal hemorrhoids    Hypertension    NASH (nonalcoholic steatohepatitis)    Neuropathy, diabetic (HCC)    Obesity (BMI 30-39.9)    OSA on CPAP    Pneumonia    Portal hypertension (HCC)    S/P TIPS (transjugular intrahepatic portosystemic shunt)    Thalassemia minor 1992   Past Surgical History:  Past Surgical History:  Procedure Laterality Date   ABDOMINAL HYSTERECTOMY     BREAST BIOPSY Right 02/15/2018   [place] bx 6-6:30 ribbon shape, ONE CORE FRAGMENT WITH FIBROSIS. ONE CORE FRAGMENT WITH PORTION OF A DILATED   BREAST BIOPSY Right 02/15/2018   [place] bx 9:00 heart shape, USUAL DUCTAL HYPERPLASIA   BREAST LUMPECTOMY Right 03/09/2018   Procedure: BREAST LUMPECTOMY x 2;  Surgeon: Sung Amabile, DO;  Location: [facility] ORS;  Service: General;  Laterality: Right;   CATARACT EXTRACTION W/PHACO Left 06/11/2016   Procedure: CATARACT EXTRACTION PHACO AND INTRAOCULAR LENS PLACEMENT (IOC);  Surgeon: Sallee Lange, MD;  Location: [facility] ORS;  Service: Ophthalmology;  Laterality: Left;  Lot # [id number] H US:01:38.6 AP%:26.4 CDE:44.15   CATARACT EXTRACTION W/PHACO Right 06/15/2018   Procedure: CATARACT  EXTRACTION PHACO AND INTRAOCULAR LENS PLACEMENT (IOC);  Surgeon: Galen Manila, MD;  Location: [facility]S;  Service: Ophthalmology;  Laterality: Right;  [place] 00:38.2 CDE 4.23 Fluid Pack Lot # [id number]   COLONOSCOPIES     COLONOSCOPY WITH PROPOFOL N/A 10/10/2020   Procedure: COLONOSCOPY WITH PROPOFOL;  Surgeon: Regis Bill, MD;  Location: [facility] ENDOSCOPY;  Service: Endoscopy;  Laterality: N/A;   COLONOSCOPY WITH PROPOFOL N/A 11/20/2020   Procedure: COLONOSCOPY WITH PROPOFOL;  Surgeon: Regis Bill, MD;  Location: [facility] ENDOSCOPY;  Service: Endoscopy;  Laterality: N/A;  DM STAT CBC, BMP COVID POSITIVE 09/02/2020   COLONOSCOPY WITH PROPOFOL N/A 06/19/2022   Procedure: COLONOSCOPY WITH PROPOFOL;  Surgeon: Regis Bill, MD;  Location: [facility] ENDOSCOPY;  Service: Endoscopy;  Laterality: N/A;   CTR     ESOPHAGOGASTRODUODENOSCOPY (EGD) WITH PROPOFOL N/A 10/10/2020   Procedure: ESOPHAGOGASTRODUODENOSCOPY (EGD) WITH PROPOFOL;  Surgeon: Regis Bill, MD;  Location: [facility] ENDOSCOPY;  Service: Endoscopy;  Laterality: N/A;  COVID POSITIVE 10/08/2020   FLEXIBLE SIGMOIDOSCOPY N/A 02/06/2022   Procedure: FLEXIBLE SIGMOIDOSCOPY;  Surgeon: Regis Bill, MD;  Location: [facility] ENDOSCOPY;  Service: Endoscopy;  Laterality: N/A;  Patient requests anesthesia   FLEXIBLE SIGMOIDOSCOPY N/A 12/19/2022   Procedure: FLEXIBLE SIGMOIDOSCOPY;  Surgeon: Dolores Frame, MD;  Location: [facility];  Service: Gastroenterology;  Laterality: N/A;   IR ANGIOGRAM SELECTIVE EACH ADDITIONAL VESSEL  02/07/2022   IR ANGIOGRAM SELECTIVE EACH ADDITIONAL VESSEL  02/07/2022   IR ANGIOGRAM SELECTIVE EACH ADDITIONAL VESSEL  09/05/2022  IR ANGIOGRAM VISCERAL SELECTIVE  02/07/2022   IR ANGIOGRAM VISCERAL SELECTIVE  09/05/2022   IR EMBO ART  VEN HEMORR LYMPH EXTRAV  INC GUIDE ROADMAPPING  09/05/2022   IR EMBO ART  VEN HEMORR LYMPH EXTRAV  INC GUIDE ROADMAPPING  11/05/2022   IR EMBO ARTERIAL NOT HEMORR HEMANG INC GUIDE  ROADMAPPING  02/07/2022   IR EMBO TUMOR ORGAN ISCHEMIA INFARCT INC GUIDE ROADMAPPING  01/06/2023   IR IMAGING GUIDED PORT INSERTION  06/13/2022   IR IMAGING GUIDED PORT INSERTION  11/25/2022   IR PARACENTESIS  12/17/2021   IR PARACENTESIS  03/25/2022   IR PARACENTESIS  11/25/2022   IR PARACENTESIS  01/06/2023   IR PARACENTESIS  01/19/2023   IR RADIOLOGIST EVAL & MGMT  03/18/2022   IR RADIOLOGIST EVAL & MGMT  05/07/2022   IR RADIOLOGIST EVAL & MGMT  05/16/2022   IR RADIOLOGIST EVAL & MGMT  10/13/2022   IR RADIOLOGIST EVAL & MGMT  12/12/2022   IR US GUIDE VASC ACCESS RIGHT  02/07/2022   IR US GUIDE VASC ACCESS RIGHT  09/05/2022   JOINT REPLACEMENT     KNEE SURGERY Right 09/24/2017   plates and pins   PORT-A-CATH REMOVAL N/A 09/23/2022   Procedure: MINOR REMOVAL PORT-A-CATH;  Surgeon: Franky Macho, MD;  Location: [facility];  Service: General;  Laterality: N/A;   TEE WITHOUT CARDIOVERSION N/A 09/23/2022   Procedure: TRANSESOPHAGEAL ECHOCARDIOGRAM (TEE);  Surgeon: Antoine Poche, MD;  Location: [facility];  Service: Endoscopy;  Laterality: N/A;   TOTAL SHOULDER ARTHROPLASTY Right 09/27/2015    Assessment & Plan Clinical Impression:   Rachel Huber is a 67 year old right-handed female with complicated medical history including type 2 diabetes mellitus, hypertension, NASH s/p TIPS revision at [facility], weekly paracentesis, recurrent grade 4 internal hemorrhoids s/p embolization of superior rectal artery 11/05/22, multifactorial anemia, OSA on CPAP, CKD III, 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 Recent admission 5/1 - 12/20/2022 from Hem/onc office for recurrent symptomatic anemia, received 4 units packed red blood cells and repeat Flex/sig showing poor prep with large external hems and splenic embolization recommended by Dr. Freda Munro.   She presented to [facility]2023 for planned partial splenic  embolization and paracentesis with 4 L yield 01/06/2023 per Dr. Elby Showers but was found to have worsening of anemia with      She was transfused with one units PRBC. She developed somnolence due hepatic encephalopathy and reports of refusing lactulose with rise in ammonia level of 69. She was started on lactulose enema as well as maintained on torsemide and Aldactone. Rise in WBC to 19,800 with low-grade fever noted and infectious work up negative. She was started on Zosyn transitioned to Augmentin and completed antibiotic  course 5/29.  She did have drop in Hgb to 6.8 on 05/27 and was transfused with one unit PRBC. She did develop fever up to 102-103 range on 05/30 which was felt to be due to transfusion reaction as UA negative and BC X 2 pend but negative so far. Leucocytosis and thrombocytopenia has resolved.    She has required ongoing paracentesis as needed with latest completed 01/14/2023 yielding 5 L of golden fluid.  Her latest ammonia level on 05/24 was 65 and she continues on scheduled lactulose.   She is tolerating a regular diet.    PTA- patient lives with spouse in  1 level home 2 steps to entry.  Ambulates with a 4 wheeled walker and  limited endurance and husband assist with ADLs.  Therapy evaluations completed due to patient decreased functional mobility was admitted for a comprehensive rehab program. Patient transferred to CIR on 01/19/2023 .    Patient currently requires mod with basic self-care skills secondary to muscle weakness, decreased cardiorespiratoy endurance, decreased coordination, and decreased standing balance.  Prior to hospitalization, patient could complete all self care with min A.  Patient will benefit from skilled intervention to decrease level of assist with basic self-care skills and increase independence with basic self-care skills prior to discharge home with care partner.  Anticipate patient will require intermittent supervision and follow up home health.  OT - End of  Session Activity Tolerance: Tolerates 10 - 20 min activity with multiple rests Endurance Deficit: Yes OT Assessment Rehab Potential (ACUTE ONLY): Good OT Barriers to Discharge: Home environment access/layout;Incontinence;Wound Care OT Patient demonstrates impairments in the following area(s): Balance;Edema;Endurance;Motor;Pain;Sensory OT Basic ADL's Functional Problem(s): Bathing;Dressing;Toileting OT Transfers Functional Problem(s): Toilet;Tub/Shower OT Additional Impairment(s): None OT Plan OT Intensity: Minimum of 1-2 x/day, 45 to 90 minutes OT Frequency: 5 out of 7 days OT Duration/Estimated Length of Stay: 7-9 days OT Treatment/Interventions: Balance/vestibular training;Discharge planning;Pain management;Self Care/advanced ADL retraining;Therapeutic Activities;UE/LE Coordination activities;Disease mangement/prevention;Functional mobility training;Patient/family education;Skin care/wound managment;Therapeutic Exercise;DME/adaptive equipment instruction;Neuromuscular re-education;Psychosocial support;Splinting/orthotics;UE/LE Strength taining/ROM;Wheelchair propulsion/positioning OT Self Feeding Anticipated Outcome(s): Independent OT Basic Self-Care Anticipated Outcome(s): Supervision OT Toileting Anticipated Outcome(s): Supervision OT Bathroom Transfers Anticipated Outcome(s): Mod I OT Recommendation Recommendations for Other Services: Therapeutic Recreation consult Therapeutic Recreation Interventions: Pet therapy Patient destination: Home Follow Up Recommendations: Home health OT Equipment Recommended: To be determined   OT Evaluation Precautions/Restrictions  Precautions Precautions: Fall Precaution Comments: post-op shoe on the left for reported skin injury-heel wound Restrictions Weight Bearing Restrictions: No RUE Weight Bearing: Weight bearing as tolerated LUE Weight Bearing: Weight bearing as tolerated RLE Weight Bearing: Weight bearing as tolerated LLE Weight  Bearing: Weight bearing as tolerated Other Position/Activity Restrictions: post op shoe on Lt foot Home Living/Prior Functioning Home Living Family/patient expects to be discharged to:: Private residence Living Arrangements: Spouse/significant other Available Help at Discharge: Family, Available 24 hours/day Type of Home: House Home Access: Stairs to enter [employer] of Steps: 1 + 1 front door Entrance Stairs-Rails: None Home Layout: One level Bathroom Shower/Tub: Tub/shower unit, Engineer, building services: Standard (has toilet seat raiser) Bathroom Accessibility: Yes Additional Comments: Has TTB, SPC and rollator, toilet seat raiser, w/c (previously her mother's)  Lives With: Spouse IADL History Homemaking Responsibilities: No Current License: Yes Occupation: Retired Type of Occupation: Quarry manager at [employer] Leisure and Hobbies: Ashby Dawes walks Prior Function Level of Independence: Requires assistive device for independence, Needs assistance with ADLs Bath: Minimal Dressing: Minimal  Able to Take Stairs?: Yes Driving: No Vision Baseline Vision/History: 1 Wears glasses Ability to See in Adequate Light: 0 Adequate Patient Visual Report: No change from baseline Vision Assessment?: No apparent visual deficits Perception  Perception: Within Functional Limits Praxis Praxis: Intact Cognition Cognition Overall Cognitive Status: Difficult to assess Arousal/Alertness: Awake/alert Orientation Level: Person;Place;Situation Person: Oriented Place: Oriented Situation: Oriented Brief Interview for Mental Status (BIMS) Repetition of Three Words (First Attempt): 3 Temporal Orientation: Year: Correct Temporal Orientation: Month: Accurate within 5 days Temporal Orientation: Day: Correct Recall: "Sock": Yes, no cue required Recall: "Blue": Yes, no cue required Recall: "Bed": Yes, no cue required BIMS Summary Score: 15 Sensation Sensation Light Touch: Impaired by gross  assessment Hot/Cold: Not tested Proprioception: Not tested Stereognosis: Not tested Additional Comments: BLE neuropathy at baseline Coordination Gross  Motor Movements are Fluid and Coordinated: No Fine Motor Movements are Fluid and Coordinated: Yes Coordination and Movement Description: generalized debility limiting coordination Finger Nose Finger Test: LUE with mild tremor, however WFL Motor  Motor Motor: Within Functional Limits  Trunk/Postural Assessment  Cervical Assessment Cervical Assessment: Exceptions to [facility] Thoracic Assessment Thoracic Assessment: Exceptions to [facility] Lumbar Assessment Lumbar Assessment: Exceptions to [facility] Postural Control Postural Control: Deficits on evaluation Righting Reactions: delayed Protective Responses: delayed  Balance Balance Balance Assessed: Yes Standardized Balance Assessment Standardized Balance Assessment: Timed Up and Go Test Timed Up and Go Test TUG: Normal TUG Normal TUG (seconds): 81.26 (with RW) Static Sitting Balance Static Sitting - Balance Support: Feet supported;No upper extremity supported Static Sitting - Level of Assistance: 5: Stand by assistance Dynamic Sitting Balance Dynamic Sitting - Balance Support: No upper extremity supported;Feet unsupported Dynamic Sitting - Level of Assistance: 5: Stand by assistance Extremity/Trunk Assessment RUE Assessment RUE Assessment: Exceptions to [facility] Active Range of Motion (AROM) Comments: limited to 90 degrees shoulder flexion otherwise WFL; history of reverse shoulder replacement General Strength Comments: 3+/5 shoulder, 4/5 distally LUE Assessment LUE Assessment: Exceptions to [facility] Active Range of Motion (AROM) Comments: WFL General Strength Comments: 4-/5 grossly  Care Tool Care Tool Self Care Eating   Eating Assist Level: Set up assist    Oral Care    Oral Care Assist Level: Supervision/Verbal cueing    Bathing   Body parts bathed by patient: Right arm;Left  arm;Chest;Abdomen;Right upper leg;Left upper leg;Face Body parts bathed by helper: Right lower leg;Left lower leg;Front perineal area;Buttocks   Assist Level: Maximal Assistance - Patient 24 - 49%    Upper Body Dressing(including orthotics)   What is the patient wearing?: Pull over shirt   Assist Level: Minimal Assistance - Patient > 75%    Lower Body Dressing (excluding footwear)   What is the patient wearing?: Underwear/pull up;Pants Assist for lower body dressing: Moderate Assistance - Patient 50 - 74%    Putting on/Taking off footwear   What is the patient wearing?: Shoes;Orthosis Assist for footwear: Dependent - Patient 0%       Care Tool Toileting Toileting activity   Assist for toileting: Maximal Assistance - Patient 25 - 49%     Care Tool Bed Mobility Roll left and right activity        Sit to lying activity        Lying to sitting on side of bed activity         Care Tool Transfers Sit to stand transfer   Sit to stand assist level: Minimal Assistance - Patient > 75%    Chair/bed transfer         Toilet transfer   Assist Level: Minimal Assistance - Patient > 75% (stand pivot)     Care Tool Cognition  Expression of Ideas and Wants Expression of Ideas and Wants: 4. Without difficulty (complex and basic) - expresses complex messages without difficulty and with speech that is clear and easy to understand  Understanding Verbal and Non-Verbal Content Understanding Verbal and Non-Verbal Content: 3. Usually understands - understands most conversations, but misses some part/intent of message. Requires cues at times to understand   Memory/Recall Ability Memory/Recall Ability : Current season;That he or she is in a hospital/hospital unit   Refer to Care Plan for Long Term Goals  SHORT TERM GOAL WEEK 1 OT Short Term Goal 1 (Week 1): STG = LTG due to ELOS  Recommendations for other services: Therapeutic Recreation  Pet therapy   Skilled Therapeutic  Intervention Patient received upright in bed with nurse finishing up central line replacement upon therapy arrival. Pt agreeable to participate in OT evaluation. 3/10 pain reported in both hands and Rt arm (baseline for her); pre-medicated. Education provided on OT purpose, therapy schedule, goals for therapy, and safety policy while in rehab. Patient demonstrates delayed processing, poor concentration, as well as impairments in global strength, dynamic standing balance and coordination resulting in difficulty completing BADL tasks without increased physical assist. Pt will benefit from skilled OT services to focus on mentioned deficits. See below for ADL and functional transfer performance. Pt remained seated in w/c at conclusion of session with belt alarm on and all needs met at end of session.     ADL ADL Eating: Set up Where Assessed-Eating: Bed level Grooming: Setup Where Assessed-Grooming: Wheelchair Upper Body Bathing: Supervision/safety Where Assessed-Upper Body Bathing: Edge of bed Lower Body Bathing: Moderate assistance Where Assessed-Lower Body Bathing: Edge of bed Upper Body Dressing: Minimal assistance Where Assessed-Upper Body Dressing: Edge of bed Lower Body Dressing: Maximal assistance Where Assessed-Lower Body Dressing: Edge of bed Toileting: Maximal assistance Where Assessed-Toileting: Bedside Commode Toilet Transfer: Minimal assistance Toilet Transfer Method: Stand pivot Toilet Transfer Equipment: Bedside commode;Other (comment) (RW) Tub/Shower Transfer: Unable to assess Tub/Shower Transfer Method: Unable to assess Film/video editor: Unable to assess Film/video editor Method: Unable to assess Mobility  Transfers Sit to Stand: Minimal Assistance - Patient > 75% Stand to Sit: Contact Guard/Touching assist   Discharge Criteria: Patient will be discharged from OT if patient refuses treatment 3 consecutive times without medical reason, if treatment goals  not met, if there is a change in medical status, if patient makes no progress towards goals or if patient is discharged from hospital.  The above assessment, treatment plan, treatment alternatives and goals were discussed and mutually agreed upon: by patient  Melvyn Novas, MS, OTR/L  01/20/2023, 12:16 PM

## 2023-01-20 NOTE — Progress Notes (Signed)
   01/20/23 1500  Spiritual Encounters  Type of Visit Follow up  Care provided to: Patient  Referral source Chaplain team  Reason for visit Advance directives  OnCall Visit No   Ch followed-up with patient. She was about to go to therapy she did not have time to do it. She will page Ch when she ready to complete AD. No follow-up needed at this time.

## 2023-01-21 ENCOUNTER — Inpatient Hospital Stay: Payer: Medicare Other

## 2023-01-21 DIAGNOSIS — K7682 Hepatic encephalopathy: Secondary | ICD-10-CM | POA: Diagnosis not present

## 2023-01-21 LAB — AMMONIA: Ammonia: 54 umol/L — ABNORMAL HIGH (ref 9–35)

## 2023-01-21 LAB — CULTURE, BODY FLUID W GRAM STAIN -BOTTLE

## 2023-01-21 MED ORDER — TORSEMIDE 20 MG PO TABS
30.0000 mg | ORAL_TABLET | Freq: Every day | ORAL | Status: DC
  Administered 2023-01-22 – 2023-01-25 (×4): 30 mg via ORAL
  Filled 2023-01-21 (×5): qty 2

## 2023-01-21 MED ORDER — CHLORHEXIDINE GLUCONATE CLOTH 2 % EX PADS
6.0000 | MEDICATED_PAD | Freq: Two times a day (BID) | CUTANEOUS | Status: DC
  Administered 2023-01-21 – 2023-01-26 (×9): 6 via TOPICAL

## 2023-01-21 NOTE — Patient Care Conference (Signed)
Inpatient RehabilitationTeam Conference and Plan of Care Update Date: 01/21/2023   Time: 11:47 AM    Patient Name: Rachel Huber      Medical Record Number: [id number]  Date of Birth: Dec 16, 1955 Sex: Female         Room/Bed: 4M03C/4M03C-01 Payor Info: Payor: MEDICARE / Plan: MEDICARE PART A AND B / Product Type: *No Product type* /    Admit Date/Time:  01/19/2023  1:09 PM  Primary Diagnosis:  Acute hepatic encephalopathy [facility])  Hospital Problems: Principal Problem:   Acute hepatic encephalopathy [facility])    Expected Discharge Date: Expected Discharge Date: 01/27/23  Team Members Present: Physician leading conference: Dr. Sula Soda Social Worker Present: Lavera Guise, BSW Nurse Present: Chana Bode, Eden Lathe, RN PT Present: Raechel Chute, PT OT Present: Candee Furbish, OT     Current Status/Progress Goal Weekly Team Focus  Bowel/Bladder   Pt is continent B/B LBM 01/20/23  *** Will maintain B/B pattern   Assist pt with toileting needs qshift/prn    Swallow/Nutrition/ Hydration      ***         ADL's   Min A UB dressing, Max A LB and toileting, Min A stand pivot/ambulatory transfers using RW. Limited by endurance, coordination and mild delayed processing. Can be self-limiting but with min guidance is cooperative with completing tasks as independently as possible  *** Supervision ADLs, Mod I transfers   Barriers: global endurance with quick fatigue. Plan: endurance training, for sustained participation in functional ADLs i.e. bathing at shower level    Mobility   bed moiblity supervision transfers with RW CGA, gait 10ft with RW min A, 4 steps mod A  *** Mod I, CGA steps  generlaized strengthening and endurance, standing balance, D/C planning    Communication      ***          Safety/Cognition/ Behavioral Observations     ***          Pain   Verbalizes pain to right/left arm.  PRN Tylenol administered and effective  *** Will be free from  pain   Assess pt for pain qshift/prn    Skin   Diabetic ulcer to left great toe. Dressing in placxe  *** Will be free from infection with no skin breakdown  Promote healing and assess for s/s of infection      Discharge Planning:  Patient anticipates discharging home with spouse to assist and provide 24/7 supervision.   Team Discussion: *** Patient on target to meet rehab goals: {IP REHAB YES/NO WITH [id number]:47425}  *See Care Plan and progress notes for long and short-term goals.   Revisions to Treatment Plan:  ***  Teaching Needs: ***  Current Barriers to Discharge: {BARRIERS TO ZDGLOVFIE:33295}  Possible Resolutions to Barriers: ***     Medical Summary Current Status: obesity, urinary incontinence, hypotension, 6/10 pain to bilateral arms, diabetic toe ulcer to left foot, hepatic encepahlopathy  Barriers to Discharge: Medical stability;Complicated Wound  Barriers to Discharge Comments: obesity, urinary incontinence, hypotension, 6/10 pain to bilateral arms, diabetic toe ulcer to left foot, hepatic encepahlopathy Possible Resolutions to [employer]: provided dietary education, decrese torsemide to 30mg  daily, check PVRs, conitnue to monitor ulcer, monitor ammonia level   Continued Need for Acute Rehabilitation Level of Care: The patient requires daily medical management by a physician with specialized training in physical medicine and rehabilitation for the following reasons: Direction of a multidisciplinary physical rehabilitation program to maximize functional independence : Yes Medical management of  patient stability for increased activity during participation in an intensive rehabilitation regime.: Yes Analysis of laboratory values and/or radiology reports with any subsequent need for medication adjustment and/or medical intervention. : Yes   I attest that I was present, lead the team conference, and concur with the assessment and plan of the  team.   Jearld Adjutant 01/21/2023, 12:03 PM

## 2023-01-21 NOTE — Progress Notes (Signed)
Patient ID: Rachel Huber, female   DOB: [birth year], 67 y.o.   MRN: [id number]  Team Conference Report to Patient/Family  Team Conference discussion was reviewed with the patient and caregiver, including goals, any changes in plan of care and target discharge date.  Patient and caregiver express understanding and are in agreement.  The patient has a target discharge date of 01/27/23.  Sw met with patient and spouse called at bedside to provide conference information. Patient and spouse feel comfortable with d/c this upcoming Tuesday. No additional questions or concerns.  Andria Rhein 01/21/2023, 1:13 PM

## 2023-01-21 NOTE — Progress Notes (Signed)
PROGRESS NOTE   Subjective/Complaints: No new complaints this morning Seen ambulating well in the hallway with therapy No new concerns from therapy   Objective: ROS: +does not like the protein supplements, denies pain   IR Paracentesis  Result Date: 01/19/2023 INDICATION: 67 year old with refractory ascites s/p TIPS at OSH May 2023, now with recurrent ascites. Request made for diagnostic and therapeutic paracentesis. EXAM: ULTRASOUND GUIDED DIAGNOSTIC AND THERAPEUTIC PARACENTESIS MEDICATIONS: 10 mL 1% lidocaine COMPLICATIONS: None immediate. PROCEDURE: Informed written consent was obtained from the patient after a discussion of the risks, benefits and alternatives to treatment. A timeout was performed prior to the initiation of the procedure. Initial ultrasound scanning demonstrates a large amount of ascites within the right lower abdominal quadrant. The right lower abdomen was prepped and draped in the usual sterile fashion. 1% lidocaine with epinephrine was used for local anesthesia. Following this, a 19 gauge, 7-cm, Yueh catheter was introduced. An ultrasound image was saved for documentation purposes. The paracentesis was performed. The catheter was removed and a dressing was applied. The patient tolerated the procedure well without immediate post procedural complication. FINDINGS: A total of approximately 5.0 liters of clear, yellow fluid was removed. Samples were sent to the laboratory as requested by the clinical team. IMPRESSION: Successful ultrasound-guided paracentesis yielding 5.0 liters of peritoneal fluid. Performed by: Loyce Dys PA-C Electronically Signed   By: Marliss Coots M.D.   On: 01/19/2023 20:50   Recent Labs    01/20/23 0316  WBC 8.3  HGB 8.3*  HCT 26.0*  PLT 314   Recent Labs    01/20/23 0316  NA 132*  K 3.9  CL 99  CO2 25  GLUCOSE 106*  BUN 36*  CREATININE 1.32*  CALCIUM 7.7*    Intake/Output  Summary (Last 24 hours) at 01/21/2023 1036 Last data filed at 01/21/2023 0846 Gross per 24 hour  Intake 593 ml  Output --  Net 593 ml        Physical Exam: Vital Signs Blood pressure (!) 91/55, pulse 72, temperature 97.8 F (36.6 C), temperature source Oral, resp. rate 18, weight 97.5 kg, SpO2 100 %. Gen: no distress, normal appearing HEENT: oral mucosa pink and moist, NCAT Cardio: Reg rate Chest: normal effort, normal rate of breathing Abd: soft, non-distended Ext: no edema Psych: pleasant, normal affect Skin:    General: Skin is warm and dry.     Comments: Dry flaky skin on BLE with tenting and stasis changes.   Neurological:     Mental Status: She is alert.     Comments: Slow to process with delayed output and needs redirection at times.     Neurologic:response time  is slow but is good historian, oriented x 3  motor strength is 5/5 in bilateral deltoid, bicep, tricep, grip, hip flexor, knee extensors, ankle dorsiflexor and plantar flexor Sensory exam normal sensation to light touch and proprioception in bilateral upper and lower extremities No UE tremor  Musculoskeletal: Full range of motion in all 4 extremities. No joint swelling  Ambulating in hallway with rollator  Assessment/Plan: 1. Functional deficits which require 3+ hours per day of interdisciplinary therapy in a comprehensive inpatient rehab setting.  Physiatrist is providing close team supervision and 24 hour management of active medical problems listed below. Physiatrist and rehab team continue to assess barriers to discharge/monitor patient progress toward functional and medical goals  Care Tool:  Bathing    Body parts bathed by patient: Right arm, Left arm, Chest, Abdomen, Right upper leg, Left upper leg, Face   Body parts bathed by helper: Right lower leg, Left lower leg, Front perineal area, Buttocks     Bathing assist Assist Level: Maximal Assistance - Patient 24 - 49%     Upper Body  Dressing/Undressing Upper body dressing   What is the patient wearing?: Pull over shirt    Upper body assist Assist Level: Minimal Assistance - Patient > 75%    Lower Body Dressing/Undressing Lower body dressing      What is the patient wearing?: Underwear/pull up, Pants     Lower body assist Assist for lower body dressing: Moderate Assistance - Patient 50 - 74%     Toileting Toileting    Toileting assist Assist for toileting: Maximal Assistance - Patient 25 - 49%     Transfers Chair/bed transfer  Transfers assist     Chair/bed transfer assist level: Minimal Assistance - Patient > 75%     Locomotion Ambulation   Ambulation assist      Assist level: Minimal Assistance - Patient > 75% Assistive device: Walker-rolling Max distance: 46   Walk 10 feet activity   Assist     Assist level: Minimal Assistance - Patient > 75% Assistive device: Walker-rolling   Walk 50 feet activity   Assist Walk 50 feet with 2 turns activity did not occur: Safety/medical concerns         Walk 150 feet activity   Assist Walk 150 feet activity did not occur: Safety/medical concerns         Walk 10 feet on uneven surface  activity   Assist     Assist level: Minimal Assistance - Patient > 75% Assistive device: Walker-rolling   Wheelchair     Assist Is the patient using a wheelchair?: No Type of Wheelchair: Manual Wheelchair activity did not occur: N/A         Wheelchair 50 feet with 2 turns activity    Assist    Wheelchair 50 feet with 2 turns activity did not occur: N/A       Wheelchair 150 feet activity     Assist          Blood pressure (!) 91/55, pulse 72, temperature 97.8 F (36.6 C), temperature source Oral, resp. rate 18, weight 97.5 kg, SpO2 100 %.  Medical Problem List and Plan: 1. Functional deficits secondary to acute hepatic encephalopathy s/p  splenic emobolization with recurrent anemia             -patient may   shower             -ELOS/Goals: 10-12d  Therapy notes reviewed 2.  Antithrombotics: -DVT/anticoagulation:  Mechanical: Antiembolism stockings, thigh (TED hose) Bilateral lower extremities             -antiplatelet therapy: N/A 3. Pain Management: Voltaren gel, Neurontin 400 mg twice daily 4. Mood/Behavior/Sleep: Zoloft 50 mg daily             -antipsychotic agents: N/A             --denies insomnia.  5. Neuropsych/cognition: This patient may be intermittently capable of making decisions on her own behalf.             --  reports cognition has improved in the past week.  6. Skin/Wound Care: Left foot ulceration followed by podiatry services.  Wound care as directed.  Routine skin checks 7. Fluids/Electrolytes/Nutrition: Routine in and outs with follow-up chemistries 8.  Acute on chronic anemia/pancytopenia: Hx of thalassemia intermedia, hems, s/p splenic embolization.    --S/p  multiple admission w/multiple transfusions --Iron infusion held due to concerns of overload due to  multiple transfusion.  Will check stool guaic  9.  CKD stage III.  Baseline SCr-  10.  NASH w/cirrhosis, Hems, ascites: Lactulose TID--has refused daily since 01/12/23.  --Add low salt restrictions and daily weights.   --Recheck ammonia level in am. --Continue torsemide and aldactone daily to help manage ascites.  --question recurrence of ascites. Will order ultrasound.  11.  Hyperlipidemia.  Continue Lipitor  12.  Chronic orthostasis. Midodrine was discontinued on 05/28 as was being held.  --Will order orthostatic BP. Add TED hose   13. Hyponatremia/Hypokalemia: supplement klor on 6/4, d/c scheduled supplement   14. Hyperammonia: discussed that level increased today and that lactulose will help with this  15. Suboptimal vitamin D with history of insufficiency: continue ergocalciferol 50,000U once per week for 7 weeks  16. Hypoalbuminemia: encouraged consuming protein in her diet  17. Hypotension:  decrease torsemide to 30mg  daily.   LOS: 2 days A FACE TO FACE EVALUATION WAS PERFORMED  Drema Pry Tehani Mersman 01/21/2023, 10:36 AM

## 2023-01-21 NOTE — Progress Notes (Signed)
Occupational Therapy Session Note  Patient Details  Name: Rachel Huber MRN: [id number] Date of Birth: 01-26-1956  Today's Date: 01/21/2023 OT Individual Time: 1035-1130 & 1419-1500 OT Individual Time Calculation (min): 55 min & 41 min   Short Term Goals: Week 1:  OT Short Term Goal 1 (Week 1): STG = LTG due to ELOS  Skilled Therapeutic Interventions/Progress Updates:  Session 1 Skilled OT intervention completed with focus on ADL retraining, ambulatory and BUE endurance. Pt received seated in recliner, asleep, required increased time to increase arousal. Pt expressed fatigue but agreeable to session. No pain reported.  Pt continues to be mildly delayed with initiation and slow processing, however with min guidance was improved with self-limiting behaviors that were previously noted on eval.   Completed all sit > stands with CGA with rollator, ambulatory transfers with CGA/supervision.  Ambulated to commode with cues needed for over bathroom threshold. Continent of void only, able to manage all toileting steps with CGA. Ambulated to recliner for extended rest break, which is consistent throughout session even with simple activities, indicated low endurance levels.  Ambulated to ortho gym, slow pace. Seated EOM, pt participated in simulated toileting and BUE endurance task by reaching on each side/behind herself to retrieve squigz, place on long mirror, and return to box directly behind her. Discussed how this translates to wiping/toileting needs as pt with sometimes pain/lack of ROM.   Ambulated back to room, with pt remaining in recliner, with chair alarm on/activated, and with all needs in reach at end of session.  Session 2 Skilled OT intervention completed with focus on ambulatory and cardiovascular endurance. Pt received seated in recliner, agreeable to session. Unrated pain reported in R arm; pre-medicated. OT offered rest breaks, repositioning throughout for pain  reduction.  Discussed timed toileting to manage incontinence. Declined toileting at start of session but agreeable to trial void prior to return to bed at end of session.  All sit > stands and ambulatory transfers using rollator with CGA/supervision. Ambulated to ortho gym.  Completed 12 mins on nustep level 6, without rest breaks to address global endurance needed for BADLs and ambulatory transfers.   Ambulated back to room, then completed toileting at supervision level for continent void. Ambulated to EOB, able to doff both shoes with supervision, min A needed for RLE TED hose, then transitioned supine with supervision.  Pt remained semi upright in bed, with bed alarm on/activated, BLE elevated on pillows for edema management and with all needs in reach at end of session.   Therapy Documentation Precautions:  Precautions Precautions: Fall Precaution Comments: post-op shoe on the left for reported skin injury-heel wound Restrictions Weight Bearing Restrictions: No     Therapy/Group: Individual Therapy  Melvyn Novas, MS, OTR/L  01/21/2023, 3:40 PM

## 2023-01-21 NOTE — Progress Notes (Signed)
Physical Therapy Session Note  Patient Details  Name: JUMAN MALLOW MRN: [id number] Date of Birth: 1956-03-21  Today's Date: 01/21/2023 PT Individual Time: 0900-0925 PT Individual Time Calculation (min): 25 min   Short Term Goals: Week 1:  PT Short Term Goal 1 (Week 1): STG's=LTG's PT Short Term Goal 1 - Progress (Week 1): Progressing toward goal  Skilled Therapeutic Interventions/Progress Updates:    Chart reviewed and pt agreeable to therapy. Pt received seated in recliner with no c/o pain. Session focused on safe use of rollator to promote safe home ambulation. Pt initiated session with multiple rounds of amb >220ft around hallways using S + rollator. T/o amb, pt practiced safe sitting in rollator for rest breaks and was educated on safe use in setting of fatigue. Pt was also educated on rollator parts and demonstrated ability to safely manage rollator in standing and during functional transfers. At end of session, pt was left seated in recliner with alarm engaged, nurse call bell and all needs in reach.     Therapy Documentation Precautions:  Precautions Precautions: Fall Precaution Comments: post-op shoe on the left for reported skin injury-heel wound Restrictions Weight Bearing Restrictions: No RUE Weight Bearing: Weight bearing as tolerated LUE Weight Bearing: Weight bearing as tolerated RLE Weight Bearing: Weight bearing as tolerated LLE Weight Bearing: Weight bearing as tolerated Other Position/Activity Restrictions: post op shoe on Lt foot  Other Treatments:      Therapy/Group: Individual Therapy  Dionne Milo, PT, DPT 01/21/2023, 9:58 AM

## 2023-01-21 NOTE — Progress Notes (Signed)
Physical Therapy Session Note  Patient Details  Name: Rachel Huber MRN: [id number] Date of Birth: Jan 30, 1956  Today's Date: 01/21/2023 PT Individual Time: [phone] and [id number] PT Individual Time Calculation (min): 41 min and 27 min  Short Term Goals: Week 1:  PT Short Term Goal 1 (Week 1): STG's=LTG's PT Short Term Goal 1 - Progress (Week 1): Progressing toward goal  Skilled Therapeutic Interventions/Progress Updates:   Treatment Session 1 Received pt semi-reclined in bed, pt agreeable to PT treatment, and denied any pain during session but reported being freezing cold - provided pt with warm blanket and pt appreciative. Session with emphasis on functional mobility/transfers, dressing, toileting, generalized strengthening and endurance, and ambulation.  Pt transferred semi-reclined<>sitting EOB with HOB elevated and supervision. Noted edema in RLE - donned ted hose on RLE, R shoe, and L post-op shoe with max A. IV team briefly present to flush line. Doffed gown and donned pull over shirt with supervision and pants with max A to thread LEs through. Stood from elevated EOB with RW and CGA/close supervision to pull pants over hips. Pt reported using rollator and SPC at home - therefore located rollator for pt to begin using while pt sat EOB and washed face with set up assist. Stood from EOB with rollator and CGA and ambulated in/out of bathroom with rollator and CGA/close supervision. Pt able to manage clothing and void - pt left in care of NT due to time restrictions.   Treatment Session 2 Received pt sitting in recliner with L post-op shoe donned. Pt agreeable to PT treatment and reported baseline pain in R shoulder. Session with emphasis on functional mobility/transfers, generalized strengthening and endurance, and gait training. Stood from Medical illustrator with rollator and close supervision and ambulated 183ft x 2 trials with rollator and CGA/close supervision with 1 seated rest break on rollator due  to increased back discomfort - pt demonstrating good understanding of rollator brake safety. Returned to room and performed seated knee extensions 2x10 bilaterally and seated hip flexion 2x10 bilaterally with emphasis on LE strength. Concluded session with pt sitting in recliner, needs within reach, and chair pad alarm on.   Therapy Documentation Precautions:  Precautions Precautions: Fall Precaution Comments: post-op shoe on the left for reported skin injury-heel wound Restrictions Weight Bearing Restrictions: No Other Position/Activity Restrictions: post op shoe on Lt foot  Therapy/Group: Individual Therapy Moraima Arbogast [name] PT, DPT 01/21/2023, 6:53 AM

## 2023-01-22 DIAGNOSIS — K7682 Hepatic encephalopathy: Secondary | ICD-10-CM | POA: Diagnosis not present

## 2023-01-22 LAB — HEMOGLOBIN AND HEMATOCRIT, BLOOD
HCT: 27.2 % — ABNORMAL LOW (ref 36.0–46.0)
Hemoglobin: 8.9 g/dL — ABNORMAL LOW (ref 12.0–15.0)

## 2023-01-22 LAB — CULTURE, BODY FLUID W GRAM STAIN -BOTTLE

## 2023-01-22 NOTE — Progress Notes (Signed)
Patient ID: Rachel Huber, female   DOB: [birth year], 67 y.o.   MRN: [id number]   Patient discharge moved to 6/10

## 2023-01-22 NOTE — IPOC Note (Signed)
Overall Plan of Care [facility]) Patient Details Name: Rachel Huber MRN: [id number] DOB: Mar 14, 1956  Admitting Diagnosis: Acute hepatic encephalopathy [facility])  Hospital Problems: Principal Problem:   Acute hepatic encephalopathy (HCC)     Functional Problem List: Nursing Safety, Bladder, Sensory, Edema, Endurance, Medication Management, Pain, Skin Integrity  PT Balance, Endurance, Pain, Perception, Safety, Skin Integrity  OT Balance, Edema, Endurance, Motor, Pain, Sensory  SLP    TR         Basic ADL's: OT Bathing, Dressing, Toileting     Advanced  ADL's: OT       Transfers: PT Bed Mobility, Bed to Chair, Car  OT Toilet, Tub/Shower     Locomotion: PT Ambulation, Stairs, Wheelchair Mobility     Additional Impairments: OT None  SLP        TR      Anticipated Outcomes Item Anticipated Outcome  Self Feeding Independent  Swallowing      Basic self-care  Supervision  Toileting  Supervision   Bathroom Transfers Mod I  Bowel/Bladder  continent B/B  Transfers  Mod I with LRAD  Locomotion  Mod I with LRAD  Communication     Cognition     Pain  les than 4  Safety/Judgment  skin will improve while in rehab   Therapy Plan: PT Intensity: Minimum of 1-2 x/day ,45 to 90 minutes PT Frequency: 5 out of 7 days PT Duration Estimated Length of Stay: 7 days OT Intensity: Minimum of 1-2 x/day, 45 to 90 minutes OT Frequency: 5 out of 7 days OT Duration/Estimated Length of Stay: 7-9 days     Team Interventions: Nursing Interventions Patient/Family Education, Medication Management, Bladder Management, Skin Care/Wound Management, Disease Management/Prevention, Pain Management, Discharge Planning  PT interventions Ambulation/gait training, Balance/vestibular training, Community reintegration, Discharge planning, Disease management/prevention, DME/adaptive equipment instruction, Functional electrical stimulation, Functional mobility training, Neuromuscular re-education, Pain  management, Patient/family education, Psychosocial support, Skin care/wound management, Stair training, Therapeutic Activities, Therapeutic Exercise, Splinting/orthotics, Wheelchair propulsion/positioning, UE/LE Strength taining/ROM, UE/LE Coordination activities  OT Interventions Balance/vestibular training, Discharge planning, Pain management, Self Care/advanced ADL retraining, Therapeutic Activities, UE/LE Coordination activities, Disease mangement/prevention, Functional mobility training, Patient/family education, Skin care/wound managment, Therapeutic Exercise, DME/adaptive equipment instruction, Neuromuscular re-education, Psychosocial support, Splinting/orthotics, UE/LE Strength taining/ROM, Wheelchair propulsion/positioning  SLP Interventions    TR Interventions    SW/CM Interventions Discharge Planning, Psychosocial Support, Patient/Family Education, Disease Management/Prevention   Barriers to Discharge MD  Medical stability  Nursing Decreased caregiver support, Home environment access/layout, Incontinence, Wound Care, Weight, Pending chemo/radiation home with spouse to a 1 level with 2 ste no rails.  PT Inaccessible home environment, Wound Care, Weight, Pending surgery (awaiting transplant list)    OT Home environment access/layout, Incontinence, Wound Care    SLP      SW Lack of/limited family support     Team Discharge Planning: Destination: PT-Home ,OT- Home , SLP-  Projected Follow-up: PT-Home health PT, OT-  Home health OT, SLP-  Projected Equipment Needs: PT-3 in 1 bedside comode, Rolling walker with 5" wheels, OT- To be determined, SLP-  Equipment Details: PT-pt may progress to safey mobility with Rollator vs RW; will update as pt progresses, OT-  Patient/family involved in discharge planning: PT- Patient,  OT-Patient, SLP-   MD ELOS: 10-12 days Medical Rehab Prognosis:  Excellent Assessment: The patient has been admitted for CIR therapies with the diagnosis of acute  hepatic encephalopathy. The team will be addressing functional mobility, strength, stamina, balance, safety, adaptive techniques and equipment, self-care,  bowel and bladder mgt, patient and caregiver education. Goals have been set at supervision. Anticipated discharge destination is home.        See Team Conference Notes for weekly updates to the plan of care

## 2023-01-22 NOTE — Progress Notes (Signed)
Occupational Therapy Session Note  Patient Details  Name: JOSEFITA ZUCHOWSKI MRN: [id number] Date of Birth: [birth year]  Today's Date: 01/22/2023 OT Individual Time: 1120-1200 OT Individual Time Calculation (min): 40 min    Short Term Goals: Week 1:  OT Short Term Goal 1 (Week 1): STG = LTG due to ELOS  Skilled Therapeutic Interventions/Progress Updates:  Skilled OT intervention completed with focus on ambulatory and cardiovascular endurance, BUE strengthening. Pt received seated in recliner, agreeable to session. Intermittent back pain reported (unrated); pre-medicated. OT offered rest breaks and repositioning throughout for pain reduction.  All sit > stands and ambulatory transfers using rollator with supervision. Ambulated to ortho gym.   Completed 12 mins on nustep level 7, without rest breaks to address global endurance needed for BADLs and ambulatory transfers.  Ambulated to EOM, the completed the following BUE exercises to address strength/ROM needed for BADLs: (With 3 lb dowel; 3x10) -chest press -front raise -bicep flexion   Ambulated back to room, then completed toileting at supervision level for continent void.  Pt remained seated in recliner, with chair alarm on/activated, and with all needs in reach at end of session.   Therapy Documentation Precautions:  Precautions Precautions: Fall Precaution Comments: post-op shoe on the left for reported skin injury-heel wound Restrictions Weight Bearing Restrictions: No RUE Weight Bearing: Weight bearing as tolerated LUE Weight Bearing: Weight bearing as tolerated RLE Weight Bearing: Weight bearing as tolerated LLE Weight Bearing: Weight bearing as tolerated Other Position/Activity Restrictions: post op shoe on Lt foot    Therapy/Group: Individual Therapy  Melvyn Novas, MS, OTR/L  01/22/2023, 12:04 PM

## 2023-01-22 NOTE — Progress Notes (Signed)
Provided additional education for CHF, daily weights and blood pressure checks at home. Put education in binder and have all CHF material together so that it's easily accessed.  Updated timed voiding for every 3/4 hours in orders and Work List.

## 2023-01-22 NOTE — Progress Notes (Signed)
Patient ID: Rachel Huber, female   DOB: 1956-05-25, 67 y.o.   MRN: [id number]  OP referral sent to [facility] Pt/OT

## 2023-01-22 NOTE — Progress Notes (Signed)
PROGRESS NOTE   Subjective/Complaints: No new complaints this morning She asks how her Hgb has been and would like to repeat it today She asks about her paracentesis on Tuesday   Objective: ROS: +does not like the protein supplements, denies pain, denies fatigue   No results found. Recent Labs    01/20/23 0316 01/22/23 0952  WBC 8.3  --   HGB 8.3* 8.9*  HCT 26.0* 27.2*  PLT 314  --    Recent Labs    01/20/23 0316  NA 132*  K 3.9  CL 99  CO2 25  GLUCOSE 106*  BUN 36*  CREATININE 1.32*  CALCIUM 7.7*    Intake/Output Summary (Last 24 hours) at 01/22/2023 1346 Last data filed at 01/22/2023 1331 Gross per 24 hour  Intake 840 ml  Output --  Net 840 ml        Physical Exam: Vital Signs Blood pressure (!) 107/39, pulse 75, temperature 98.4 F (36.9 C), temperature source Oral, resp. rate 16, weight 97.5 kg, SpO2 100 %. Gen: no distress, normal appearing HEENT: oral mucosa pink and moist, NCAT Cardio: Reg rate Chest: normal effort, normal rate of breathing Abd: soft, non-distended Ext: no edema Psych: pleasant, normal affect Skin:    General: Skin is warm and dry.     Comments: Dry flaky skin on BLE with tenting and stasis changes.   Neurological:     Mental Status: She is alert.     Comments: Slow to process with delayed output and needs redirection at times.     Neurologic:response time  is slow but is good historian, oriented x 3  motor strength is 5/5 in bilateral deltoid, bicep, tricep, grip, hip flexor, knee extensors, ankle dorsiflexor and plantar flexor Sensory exam normal sensation to light touch and proprioception in bilateral upper and lower extremities No UE tremor  Musculoskeletal: Full range of motion in all 4 extremities. No joint swelling  Ambulating in hallway with rollator  Assessment/Plan: 1. Functional deficits which require 3+ hours per day of interdisciplinary therapy in a  comprehensive inpatient rehab setting. Physiatrist is providing close team supervision and 24 hour management of active medical problems listed below. Physiatrist and rehab team continue to assess barriers to discharge/monitor patient progress toward functional and medical goals  Care Tool:  Bathing    Body parts bathed by patient: Right arm, Left arm, Chest, Abdomen, Right upper leg, Left upper leg, Face   Body parts bathed by helper: Right lower leg, Left lower leg, Front perineal area, Buttocks     Bathing assist Assist Level: Maximal Assistance - Patient 24 - 49%     Upper Body Dressing/Undressing Upper body dressing   What is the patient wearing?: Pull over shirt    Upper body assist Assist Level: Minimal Assistance - Patient > 75%    Lower Body Dressing/Undressing Lower body dressing      What is the patient wearing?: Underwear/pull up, Pants     Lower body assist Assist for lower body dressing: Moderate Assistance - Patient 50 - 74%     Toileting Toileting    Toileting assist Assist for toileting: Contact Guard/Touching assist     Transfers  Chair/bed transfer  Transfers assist     Chair/bed transfer assist level: Supervision/Verbal cueing     Locomotion Ambulation   Ambulation assist      Assist level: Supervision/Verbal cueing Assistive device: Rollator Max distance: 197ft   Walk 10 feet activity   Assist     Assist level: Supervision/Verbal cueing Assistive device: Rollator   Walk 50 feet activity   Assist Walk 50 feet with 2 turns activity did not occur: Safety/medical concerns  Assist level: Supervision/Verbal cueing Assistive device: Rollator    Walk 150 feet activity   Assist Walk 150 feet activity did not occur: Safety/medical concerns  Assist level: Supervision/Verbal cueing Assistive device: Rollator    Walk 10 feet on uneven surface  activity   Assist     Assist level: Minimal Assistance - Patient >  75% Assistive device: Walker-rolling   Wheelchair     Assist Is the patient using a wheelchair?: No Type of Wheelchair: Manual    Wheelchair assist level: Dependent - Patient 0%      Wheelchair 50 feet with 2 turns activity    Assist            Wheelchair 150 feet activity     Assist      Assist Level: Dependent - Patient 0%   Blood pressure (!) 107/39, pulse 75, temperature 98.4 F (36.9 C), temperature source Oral, resp. rate 16, weight 97.5 kg, SpO2 100 %.  Medical Problem List and Plan: 1. Functional deficits secondary to acute hepatic encephalopathy s/p  splenic emobolization with recurrent anemia             -patient may  shower             -ELOS/Goals: 10-12d  Therapy notes reviewed 2.  Antithrombotics: -DVT/anticoagulation:  Mechanical: Antiembolism stockings, thigh (TED hose) Bilateral lower extremities             -antiplatelet therapy: N/A 3. Pain Management: Voltaren gel, Neurontin 400 mg twice daily 4. Mood/Behavior/Sleep: Zoloft 50 mg daily             -antipsychotic agents: N/A             --denies insomnia.  5. Neuropsych/cognition: This patient may be intermittently capable of making decisions on her own behalf.             --reports cognition has improved in the past week.  6. Skin/Wound Care: Left foot ulceration followed by podiatry services.  Wound care as directed.  Routine skin checks 7. Fluids/Electrolytes/Nutrition: Routine in and outs with follow-up chemistries 8.  Acute on chronic anemia/pancytopenia: Hx of thalassemia intermedia, hems, s/p splenic embolization.    --S/p  multiple admission w/multiple transfusions --Iron infusion held due to concerns of overload due to  multiple transfusion.  Will check stool guaic  Hgb reviewed and is improving  9.  CKD stage III.  Baseline SCr-  10.  NASH w/cirrhosis, Hems, ascites: Lactulose TID--has refused daily since 01/12/23.  --Add low salt restrictions and daily weights.   --Ammonia  reviewed and is downtrending --Continue torsemide and aldactone daily to help manage ascites.  -moved discharge up to Monday so she can attend outpatient paracentesis on Tuesday at 10:30am  11.  Hyperlipidemia.  Continue Lipitor  12.  Chronic orthostasis. Midodrine was discontinued on 05/28 as was being held.  --Will order orthostatic BP. Add TED hose   13. Hyponatremia/Hypokalemia: supplement klor on 6/4, d/c scheduled supplement   14. Hyperammonia: discussed that level increased  today and that lactulose will help with this  15. Suboptimal vitamin D with history of insufficiency: continue ergocalciferol 50,000U once per week for 7 weeks  16. Hypoalbuminemia: encouraged consuming protein in her diet  17. Hypotension: decrease torsemide to 30mg  daily. Monitor daily weights  LOS: 3 days A FACE TO FACE EVALUATION WAS PERFORMED  Clint Bolder P Blakelee Allington 01/22/2023, 1:46 PM

## 2023-01-22 NOTE — Progress Notes (Signed)
Physical Therapy Session Note  Patient Details  Name: NEELAM KALISCH MRN: [id number] Date of Birth: [birth year]  Today's Date: 01/22/2023 PT Individual Time: 1416-1530 PT Individual Time Calculation (min): 74 min   Short Term Goals: Week 1:  PT Short Term Goal 1 (Week 1): STG's=LTG's PT Short Term Goal 1 - Progress (Week 1): Progressing toward goal  Skilled Therapeutic Interventions/Progress Updates: Pt presents sitting in recliner and agreeable to therapy.  Pt transfers w/ supervision from recliner, toilet, mat table and rollator seat although sometimes increased time for hand and foot positioning.  Pt amb multiple trials w/ rollator and supervision > 150' to [facility] entrance, including on and off elevators.  Pt utilized BR downstairs , continent of bowel and bladder, charted in Flowsheets.  Pt continued to outdoors.  Pt encouraged to assess need to sit w/ fatigue and then positioned rollator against wall, managing brakes and resting.  Pt amb x 120' on outside uneven surface w/ supervision, not LOB.  Pt does require cues for upright stance and increasing WB through LES, pt stating " probably more than I want to" in response to PT question of WB through UES.  Pt eventually amb back to bed and required A to doff shoe and Darco shoe in sitting.  Pt transferred to supine w/ supervision, TED hose removed.  All needs in reach.     Therapy Documentation Precautions:  Precautions Precautions: Fall Precaution Comments: post-op shoe on the left for reported skin injury-heel wound Restrictions Weight Bearing Restrictions: No RUE Weight Bearing: Weight bearing as tolerated LUE Weight Bearing: Weight bearing as tolerated RLE Weight Bearing: Weight bearing as tolerated LLE Weight Bearing: Weight bearing as tolerated Other Position/Activity Restrictions: post op shoe on Lt foot General:   Vital Signs: Therapy Vitals Temp: 98.4 F (36.9 C) Temp Source: Oral Pulse Rate: 75 Resp: 16 BP: (!)  107/39 Patient Position (if appropriate): Sitting Oxygen Therapy SpO2: 100 % O2 Device: Room Air Pain:3/10 low back     Therapy/Group: Individual Therapy  Lucio Edward 01/22/2023, 3:55 PM

## 2023-01-22 NOTE — Progress Notes (Signed)
Patient requested to be awaken Q2-3 hours through out the night, to help with incontinences issues. Nurse and nurse tech time toileted throughout the night and patient felt ike it helped with her incontinences. Patient in bed with call bell with in reach.

## 2023-01-22 NOTE — Discharge Summary (Signed)
Physician Discharge Summary  Patient ID: Rachel Huber MRN: [id number] DOB/AGE: [birth year] 65 y.o.  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 Request made for diagnostic and therapeutic paracentesis. EXAM: ULTRASOUND GUIDED DIAGNOSTIC AND THERAPEUTIC PARACENTESIS MEDICATIONS: 10 mL 1% lidocaine COMPLICATIONS: None immediate. PROCEDURE: Informed written consent was obtained from the patient after a discussion of the risks, benefits and alternatives to treatment. A timeout was performed prior to the initiation of the procedure. Initial ultrasound scanning demonstrates a large amount of ascites within the right lower abdominal quadrant. The right lower abdomen was prepped and draped in the usual sterile fashion. 1% lidocaine with epinephrine was used for local anesthesia. Following this, a 19 gauge, 7-cm, Yueh catheter was introduced. An ultrasound image was saved for documentation purposes. The paracentesis was performed. The catheter was removed and a dressing was applied. The patient tolerated the procedure well without immediate post procedural complication. FINDINGS: A total of approximately 5.0 liters of clear, yellow fluid was removed. Samples were sent to the laboratory as requested by the clinical team. IMPRESSION: Successful ultrasound-guided paracentesis yielding 5.0 liters of peritoneal fluid. Performed by: Loyce Dys PA-C Electronically Signed   By: Marliss Coots M.D.   On: 01/19/2023 20:50   CT ABDOMEN PELVIS WO CONTRAST  Result  Date: 01/16/2023 CLINICAL DATA:  67 year old history decompensated MASH cirrhosis status post TIPS creation and partial splenic embolization in addition to multiple superior rectal artery embolizations for hemorrhagic hemorrhoids. The patient remains admitted after partial splenic embolization on 01/06/2023, with fevers after blood transfusion. EXAM: CT ABDOMEN AND PELVIS WITHOUT CONTRAST TECHNIQUE: Multidetector CT imaging of the abdomen and pelvis was performed following the standard protocol without IV contrast. RADIATION DOSE REDUCTION: This exam was performed according to the departmental dose-optimization program which includes automated exposure control, adjustment of the mA and/or kV according to patient size and/or use of iterative reconstruction technique. COMPARISON:  01/08/2023, 01/06/2023, 09/18/2012 FINDINGS: Lower chest: Trace bilateral pleural effusions, left greater than right, similar to comparison. Similar cardiomegaly. Hepatobiliary: Similar appearing shrunken right lobe with left and caudate lobe hypertrophy. Nodular contour. Tips endograft in place, unchanged. Gallbladder is present with vicarious excretion of contrast and multiple punctate calcific gallstones. No gallbladder wall thickening. Pancreas: Unremarkable. No pancreatic ductal dilatation or surrounding inflammatory changes. Spleen: Geographic hypoattenuation inferior aspect of the spleen, no evidence of internal some collections. Adrenals/Urinary Tract: Adrenal glands are unremarkable. Similar appearing punctate nonobstructive left anterior interpolar renal calculus. Kidneys are otherwise normal, without new renal calculi, focal lesion, or hydronephrosis. Bladder is unremarkable. Stomach/Bowel: Stomach is within normal limits. Appendix is not. No evidence of bowel wall thickening, distention, or inflammatory changes. Vascular/Lymphatic: Similar appearing postprocedural changes after bilateral superior rectal coil embolization and left  inferior polar splenic embolization. Aortic atherosclerosis. No enlarged abdominal or pelvic lymph nodes. Definitively identified Reproductive: Status post hysterectomy. No adnexal masses. Other: Small volume ascites. Diffuse body wall anasarca. Small fluid containing umbilical hernia. Musculoskeletal: No acute osseous abnormality. Multilevel degenerative changes of the lumbar spine. IMPRESSION: 1. No acute abnormality in the abdomen or pelvis to explain leukocytosis. 2. Postprocedural changes after inferior pole partial splenic embolization with expected splenic parenchymal hypoattenuation. No evidence  of abscess formation. 3. Similar appearing morphologic changes of cirrhosis and portal hypertension despite presence of indwelling TIPS including splenomegaly and small volume ascites. 4. Diffuse anasarca. 5. Trace bilateral pleural effusions, left greater than right. 6. Unchanged cardiomegaly. 7. Cholelithiasis without definite evidence cholecystitis. Marliss Coots, MD [facility] Radiology Electronically Signed   By: Marliss Coots M.D.   On: 01/16/2023 10:57   US Paracentesis  Result Date: 01/16/2023 INDICATION: History of NASH cirrhosis with recurrent ascites despite TIPS creation and revision performed at outside hospital. Patient presents for therapeutic paracentesis. EXAM: ULTRASOUND GUIDED right lower quadrant therapeutic PARACENTESIS MEDICATIONS: 15 mL 1% lidocaine COMPLICATIONS: None immediate. PROCEDURE: Informed written consent was obtained from the patient after a discussion of the risks, benefits and alternatives to treatment. A timeout was performed prior to the initiation of the procedure. Initial ultrasound scanning demonstrates a large amount of ascites within the right lower abdominal quadrant. The right lower abdomen was prepped and draped in the usual sterile fashion. 1% lidocaine was used for local anesthesia. Following this, a 6 Fr  Safe-T-Centesis catheter was introduced. An ultrasound image was saved for documentation purposes. The paracentesis was performed. The catheter was removed and a dressing was applied. The patient tolerated the procedure well without immediate post procedural complication. Patient received post-procedure intravenous albumin; see nursing notes for details. FINDINGS: A total of approximately 5 L of golden fluid was removed. Ordering provider did not request laboratory samples. IMPRESSION: Successful ultrasound-guided paracentesis yielding 5 liters of peritoneal fluid. PLAN: Patient has previously undergone TIPS procedure and revision at outside hospital. Procedure performed by Mina Marble, PA-C Electronically Signed   By: Corlis Leak M.D.   On: 01/16/2023 07:32   DG ABD ACUTE 2+V W 1V CHEST  Result Date: 01/15/2023 CLINICAL DATA:  Paracentesis, fever EXAM: DG ABDOMEN ACUTE WITH 1 VIEW CHEST COMPARISON:  01/08/2023 FINDINGS: Right Port-A-Cath remains in place, unchanged. Cardiomegaly. No confluent airspace opacities or effusions. Nonobstructive bowel gas pattern. No organomegaly, free air or suspicious calcification. IMPRESSION: No evidence of bowel obstruction or free air. Cardiomegaly.  No acute cardiopulmonary disease. Electronically Signed   By: Charlett Nose M.D.   On: 01/15/2023 03:50   CT ABDOMEN PELVIS WO CONTRAST  Result Date: 01/08/2023 CLINICAL DATA:  67 y.o. 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 Elby Showers of IR, and was found have worsening anemia. EXAM: CT ABDOMEN AND PELVIS WITHOUT  CONTRAST TECHNIQUE: Multidetector CT imaging of the abdomen and pelvis was performed following the standard protocol without IV contrast. RADIATION DOSE REDUCTION: This exam was performed according to the departmental dose-optimization program which includes automated exposure control, adjustment of the mA and/or kV according to patient size and/or use of iterative reconstruction technique. COMPARISON:  CT abdomen and pelvis 09/18/2022 FINDINGS: Lower chest: Small left-greater-than-right pleural effusions. Associated compressive atelectasis. Hepatobiliary: TIPS. Nodular contour of the liver compatible with cirrhosis. Cholelithiasis. No biliary dilation. Pancreas: Unremarkable. Spleen: Embolization coils about the splenic hilum. Splenomegaly measuring 17.5 cm in craniocaudal dimension, previously 16.9 cm. Adrenals/Urinary Tract: Stable adrenal glands. No urinary calculi or hydronephrosis. Stomach/Bowel: Normal caliber large and small bowel. No definite wall thickening. Decompressed stomach. Vascular/Lymphatic: Aortic atherosclerosis. No enlarged  abdominal or pelvic lymph nodes. Reproductive: Status post hysterectomy. No adnexal masses. Other: Moderate low-density abdominopelvic ascites. No free intraperitoneal air. Body wall anasarca. Musculoskeletal: No acute abnormality. IMPRESSION: 1. Small left-greater-than-right pleural effusions with associated compressive atelectasis. 2. Cirrhotic liver with TIPS. 3. Splenomegaly, increased from 09/18/2022. 4. Moderate abdominopelvic ascites. Body wall anasarca. 5. Cholelithiasis. Aortic Atherosclerosis (ICD10-I70.0). Electronically Signed   By: Minerva Fester M.D.   On: 01/08/2023 18:25   DG Chest 2 View  Result Date: 01/08/2023 CLINICAL DATA:  Fever EXAM: CHEST - 2 VIEW COMPARISON:  11/17/2022 FINDINGS: Frontal and lateral views of the chest demonstrates stable enlargement of the cardiac silhouette. There is increased pulmonary vascular congestion, with interval  development of interstitial and ground-glass opacities throughout the lungs. Small bilateral pleural effusions. No pneumothorax. Right chest wall port tip overlies superior vena cava. IMPRESSION: 1. Interval development of interstitial and ground-glass opacities throughout the lungs. This could be secondary to volume overload or infection. 2. Small bilateral pleural effusions. 3. Enlarged cardiac silhouette, with increased central vascular congestion. Electronically Signed   By: Sharlet Salina M.D.   On: 01/08/2023 17:12   IR EMBO TUMOR ORGAN ISCHEMIA INFARCT INC GUIDE ROADMAPPING  Result Date: 01/06/2023 INDICATION: Mrs. Fesperman is a 67 year old female with 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 She underwent "emborrhoid" procedure on 02/07/22 which significantly improved her rectal bleeding initially which gradually returned. She underwent repeat angiogram and embolization of the duplicated bilateral superior rectal arteries on 09/05/22 due to persistent anemia requiring transfusions. Again, she underwent repeat superior rectal embolization on 11/05/22 by my partner, Dr. Archer Asa. Despite this third embolization of the superior rectal arteries, she continues to have rectal bleeding, exacerbated by large volume ascites. She presents today for paracentesis and partial splenic embolization. EXAM: 1. Ultrasound-guided paracentesis. 2. Ultrasound-guided vascular access of the left radial artery. 3. Catheterization and angiography of the celiac, splenic, and inferior polar splenic branches. 4. Particle and coil embolization of the inferior pole splenic artery branch. MEDICATIONS: None. ANESTHESIA/SEDATION: Moderate (conscious) sedation was employed during this procedure. A total of Versed 2 mg and Fentanyl 100 mcg was administered intravenously. Moderate  Sedation Time: 76 minutes. The patient's level of consciousness and vital signs were monitored continuously by radiology nursing throughout the procedure under my direct supervision. CONTRAST:  OMNIPAQUE IOHEXOL 300 MG/ML  SOLN FLUOROSCOPY: Radiation Exposure Index (as provided by the fluoroscopic device): 1,139 mGy Kerma COMPLICATIONS: None immediate. PROCEDURE: Informed consent was obtained from the patient following explanation of the procedure, risks, benefits and alternatives. The patient understands, agrees and consents for the procedure. All questions were addressed. A time out was performed prior to the initiation of the procedure. Maximal barrier sterile technique utilized including caps, mask, sterile gowns, sterile gloves, large sterile drape, hand hygiene, and Betadine prep. Preprocedure ultrasound evaluation of the right upper quadrant demonstrated moderate to large volume ascites. The right upper quadrant was prepped and draped in standard fashion. Subdermal Local anesthesia was provided at the planned needle entry site. A small skin nick was made. Under direct ultrasound visualization, a 10 cm Yueh needle was introduced into the peritoneal cavity. A total of approximately 4 L of translucent, straw-colored fluid was aspirated throughout the procedure. The catheter was then removed and a sterile bandage was applied. The left wrist was prepped and draped in standard fashion. Pulse oximeter was attached to  the left thumb. Tora Perches test was performed, grade A. The left radial artery measured 0.20 cm in diameter. Subdermal Local anesthesia was provided at the planned needle entry site with 1% lidocaine. A small skin nick was made. Under direct ultrasound visualization, the left radial artery was punctured with a 21 gauge micropuncture needle. A permanent image was captured and stored in the record. A microwire was placed and exchanged for a 4/5 French slender sheath. The sheath was flushed followed by  installation of standard radial cocktail. Under direct fluoroscopic guidance, a 5 Fr MG1 catheter with indwelling Benson wire was directed in retrograde fashion through the left upper extremity arteries to the level of the aortic arch, through the descending thoracic aorta, and into the proximal abdominal aorta. The celiac artery was selected. Celiac angiogram demonstrated patency and conventional anatomy. The proximal left splenic artery was then selected. Splenic angiogram was performed which demonstrated patency of the splenic artery with the inferior polar branch supplying approximately 40-50% of the total splenic volume. A Renegade hi Flo microcatheter and fathom 16 microwire were then used to select the inferior polar branch. Inferior polar branch angiogram was performed which demonstrated no evidence of collateralized nontarget arterial branches. Next, under intermittent fluoroscopic guidance, embolization of the inferior polar splenic artery branch was performed with 600 micrometer (3 vials) followed by 800 micrometer (1 vial) Hydropearls. Repeat angiogram demonstrated was near complete stasis. Next, a 6 mm Ruby pot micro coil was then deployed. Completion splenic angiogram demonstrated complete embolization of the inferior polar branch artery. The catheters were removed. A TR band was applied to the left radial access site in the sheath was removed. The patient tolerated the procedure well was transferred to the recovery area in good condition. Proved preprocedure laboratory values demonstrated hemoglobin of 5.5, therefore request was made to the Hospitalist service for admission for transfusion and pain control. IMPRESSION: 1. Technically successful particle and coil embolization of the inferior polar splenic artery with resulting embolization approximately 40-50% of the total splenic volume. 2. Technically successful ultrasound-guided paracentesis yielding 4 L ascites. PLAN: Follow-up in Interventional  Radiology [facility] in 1 month. Marliss Coots, MD [facility] Radiology Electronically Signed   By: Marliss Coots M.D.   On: 01/06/2023 13:48   IR Paracentesis  Result Date: 01/06/2023 INDICATION: Mrs. Martone is a 67 year old female with 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 She underwent "emborrhoid" procedure on 02/07/22 which significantly improved her rectal bleeding initially which gradually returned. She underwent repeat angiogram and embolization of the duplicated bilateral superior rectal arteries on 09/05/22 due to persistent anemia requiring transfusions. Again, she underwent repeat superior rectal embolization on 11/05/22 by my partner, Dr. Archer Asa. Despite this third embolization of the superior rectal arteries, she continues to have rectal bleeding, exacerbated by large volume ascites. She presents today for paracentesis and partial splenic embolization. EXAM: 1. Ultrasound-guided paracentesis. 2. Ultrasound-guided vascular access of the left radial artery. 3. Catheterization and angiography of the celiac, splenic, and inferior polar splenic branches. 4. Particle and coil embolization of the inferior pole splenic artery branch. MEDICATIONS: None. ANESTHESIA/SEDATION: Moderate (conscious) sedation was employed during this procedure. A total of Versed 2 mg and Fentanyl 100 mcg was administered intravenously. Moderate Sedation Time: 76 minutes. The patient's level of consciousness and vital signs were monitored continuously by radiology nursing throughout the  procedure under my direct supervision. CONTRAST:  OMNIPAQUE IOHEXOL 300 MG/ML  SOLN FLUOROSCOPY: Radiation Exposure Index (as provided by the fluoroscopic device): 1,139 mGy Kerma COMPLICATIONS: None immediate.  PROCEDURE: Informed consent was obtained from the patient following explanation of the procedure, risks, benefits and alternatives. The patient understands, agrees and consents for the procedure. All questions were addressed. A time out was performed prior to the initiation of the procedure. Maximal barrier sterile technique utilized including caps, mask, sterile gowns, sterile gloves, large sterile drape, hand hygiene, and Betadine prep. Preprocedure ultrasound evaluation of the right upper quadrant demonstrated moderate to large volume ascites. The right upper quadrant was prepped and draped in standard fashion. Subdermal Local anesthesia was provided at the planned needle entry site. A small skin nick was made. Under direct ultrasound visualization, a 10 cm Yueh needle was introduced into the peritoneal cavity. A total of approximately 4 L of translucent, straw-colored fluid was aspirated throughout the procedure. The catheter was then removed and a sterile bandage was applied. The left wrist was prepped and draped in standard fashion. Pulse oximeter was attached to the left thumb. Tora Perches test was performed, grade A. The left radial artery measured 0.20 cm in diameter. Subdermal Local anesthesia was provided at the planned needle entry site with 1% lidocaine. A small skin nick was made. Under direct ultrasound visualization, the left radial artery was punctured with a 21 gauge micropuncture needle. A permanent image was captured and stored in the record. A microwire was placed and exchanged for a 4/5 French slender sheath. The sheath was flushed followed by installation of standard radial cocktail. Under direct fluoroscopic guidance, a 5 Fr MG1 catheter with indwelling Benson wire was directed in retrograde fashion through the left upper extremity arteries to the level of the aortic arch, through the descending thoracic aorta, and into the proximal abdominal aorta. The celiac artery was selected. Celiac  angiogram demonstrated patency and conventional anatomy. The proximal left splenic artery was then selected. Splenic angiogram was performed which demonstrated patency of the splenic artery with the inferior polar branch supplying approximately 40-50% of the total splenic volume. A Renegade hi Flo microcatheter and fathom 16 microwire were then used to select the inferior polar branch. Inferior polar branch angiogram was performed which demonstrated no evidence of collateralized nontarget arterial branches. Next, under intermittent fluoroscopic guidance, embolization of the inferior polar splenic artery branch was performed with 600 micrometer (3 vials) followed by 800 micrometer (1 vial) Hydropearls. Repeat angiogram demonstrated was near complete stasis. Next, a 6 mm Ruby pot micro coil was then deployed. Completion splenic angiogram demonstrated complete embolization of the inferior polar branch artery. The catheters were removed. A TR band was applied to the left radial access site in the sheath was removed. The patient tolerated the procedure well was transferred to the recovery area in good condition. Proved preprocedure laboratory values demonstrated hemoglobin of 5.5, therefore request was made to the Hospitalist service for admission for transfusion and pain control. IMPRESSION: 1. Technically successful particle and coil embolization of the inferior polar splenic artery with resulting embolization approximately 40-50% of the total splenic volume. 2. Technically successful ultrasound-guided paracentesis yielding 4 L ascites. PLAN: Follow-up in Interventional Radiology Portal Hypertension Clinic in 1 month. Marliss Coots, MD [facility] Radiology Electronically Signed   By: Marliss Coots M.D.   On: 01/06/2023 13:48   US Paracentesis  Result Date: 12/30/2022 INDICATION: 67 year old female. History of NASH cirrhosis with recurrent ascites despite  tips  creation and revision performed at outside hospital. Patient presents for therapeutic paracentesis. Maximum 8 L EXAM: ULTRASOUND GUIDED THERAPEUTIC PARACENTESIS MEDICATIONS: Lidocaine 1% 10 mL COMPLICATIONS: None immediate. PROCEDURE: Informed written consent was obtained from the patient after a discussion of the risks, benefits and alternatives to treatment. A timeout was performed prior to the initiation of the procedure. Initial ultrasound scanning demonstrates a moderate amount of ascites within the right lower abdominal quadrant. The right lower abdomen was prepped and draped in the usual sterile fashion. 1% lidocaine was used for local anesthesia. Following this, a 6 Fr Safe-T-Centesis catheter was introduced. An ultrasound image was saved for documentation purposes. The paracentesis was performed. The catheter was removed and a dressing was applied. The patient tolerated the procedure well without immediate post procedural complication. Patient received post-procedure intravenous albumin; see nursing notes for details. FINDINGS: A total of approximately 5.7 L of straw-colored fluid was removed. IMPRESSION: Successful ultrasound-guided therapeutic paracentesis yielding 5.7 liters of peritoneal fluid. Performed by: Anders Grant, NP Electronically Signed   By: Irish Lack M.D.   On: 12/30/2022 12:32    Labs:  Basic Metabolic Panel: Recent Labs  Lab 01/20/23 0316  NA 132*  K 3.9  CL 99  CO2 25  GLUCOSE 106*  BUN 36*  CREATININE 1.32*  CALCIUM 7.7*    CBC: Recent Labs  Lab 01/20/23 0316 01/22/23 0952  WBC 8.3  --   NEUTROABS 5.5  --   HGB 8.3* 8.9*  HCT 26.0* 27.2*  MCV 72.2*  --   PLT 314  --     CBG: Recent Labs  Lab 01/19/23 1736 01/19/23 2100 01/20/23 0552 01/20/23 1630  GLUCAP 160* 154* 96 188*   Family history.  Mother with breast cancer and lymphoma father with diabetes and kidney cancer.  Denies any colon cancer esophageal cancer or rectal cancer  Brief  HPI:   Rachel Huber is a 67 y.o. 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  Recent admission 5/1 - 12/20/2022 from hematology oncology office for recurrent symptomatic anemia received 4 units packed red blood cells and repeat flex/Sigg showing poor prep with large external hems and splenic embolization recommended by gastroenterology.  Presented to [facility] 01/06/2023 for planned partial splenic embolization and paracentesis with 4 L yield 01/06/2023 per Dr. Elby Showers but was found to have worsening of anemia.  She was transfused 1 unit packed red blood cells.  Developed somnolence due to hepatic encephalopathy and reports of refusing lactulose with rise in ammonia level of 69.  She was started on lactulose enema as well as maintained on torsemide and Aldactone.  Rise in WBC to 19,800 with low-grade fever noted and infectious workup negative.  She was started on Zosyn and transition to Augmentin and completed antibiotic course 5/29.  She did have a drop in hemoglobin to 6.8 on 5/27 was transfused 1 unit packed red blood cells.  She developed fever of 102-103 range on 5/30 which was felt to be due to transfusion reaction and urinalysis negative blood cultures x 2 negative.  Leukocytosis and thrombocytopenia resolved.  She required ongoing paracentesis as needed with latest completed 01/14/2023 yielding 5 L of golden fluid.  Her ammonia levels  on 5/24 was 65 and continued on scheduled lactulose.  Therapy evaluations completed due to patient decreased functional mobility was admitted for a  comprehensive rehab program.   Hospital Course: Rachel Huber was admitted to rehab 01/19/2023 for inpatient therapies to consist of PT, ST and OT at least three hours five days a week. Past admission physiatrist, therapy team and rehab RN have worked together to provide customized collaborative inpatient rehab.  Pertaining to patient's acute encephalopathy status post splenic embolization with recurrent anemia.  Her ammonia levels latest of 54-62 and remained on lactulose.  Patient remained stable.  Latest hemoglobin 8.3 and latest platelets 314,000.  Leukocytosis resolved 8300.  Patient will continue to be followed by hematology oncology.  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  Pain management use of Neurontin scheduled with the use of Voltaren gel and Fioricet as needed for headaches.  CKD stage III with latest creatinine stable 1.32.  Noted history of Nash's with cirrhosis lactulose as advised patient was refusing at times close monitoring of ammonia levels she continued on torsemide as well as Aldactone.  Close monitoring of potassium while on Demadex.  Patient was receiving paracentesis as needed with next planned outpatient paracentesis for 01/27/2023.  Lipitor ongoing for hyperlipidemia.  Mood stabilization with the use of Zoloft and emotional support provided.   Blood pressures were monitored on TID basis and soft and monitored    Rehab course: During patient's stay in rehab weekly team conferences were held to monitor patient's progress, set goals and discuss barriers to discharge. At admission, patient required minimal assist 1 foot with a rolling walker minimal assist step pivot transfers  Physical exam.  Blood pressure 133/56 pulse 75 temperature 99.3 respirations 18 oxygen saturation is 100% room air Constitutional.  No acute distress HEENT Head.  Normocephalic and atraumatic Eyes.  Pupils round and reactive to light no  discharge without nystagmus Neck.  Supple nontender no JVD without thyromegaly Cardiac regular rate and rhythm without any extra sounds or murmur heard Abdomen.  Soft nontender positive bowel sounds without rebound Respiratory effort normal no respiratory distress without wheeze Neurologic.  She was somewhat slow to process needing some redirection at times Motor strength 5/5 in bilateral deltoid bicep tricep grip hip flexors knee extensors ankle dorsi plantarflexion Sensation intact.  He/She  has had improvement in activity tolerance, balance, postural control as well as ability to compensate for deficits. He/She has had improvement in functional use RUE/LUE  and RLE/LLE as well as improvement in awareness.  Sessions focused on energy conservation.  Performed all transfers with rollator and modified independent.  Ambulates 150 feet x 2 with rollator.  Ambulates into the bathroom with rollator modified independent.  She is modified independent level for ADLs.  Full family teaching completed plan discharge to home       Disposition: Discharge to home    Diet: Regular  Special Instructions: No driving smoking or alcohol  Medications at discharge. 1.  Tylenol as needed 2.  Lipitor 10 mg daily 3.  Fioricet 1 tablet every 6 hours as needed headache 4.  Vitamin B12 1000 mcg p.o. daily 5.  Voltaren gel 4 g 4 times daily to affected area 6.  Neurontin 400 mg p.o. twice daily 7.  Chronulac 20 mg p.o. 3 times daily 8.  Zoloft 50 mg p.o. daily 9.  Aldactone 50 mg p.o. daily 10.  Demadex 40 mg p.o. daily 11.  Vitamin  D 50,000 units every 7 days 12.  Multivitamin daily 13.  Ventolin inhaler 2 puffs every 6 hours as needed shortness of breath   Discharge Instructions     Ambulatory referral to Physical Medicine Rehab   Complete by: As directed    Moderate complexity follow-up 1 to 2 weeks acute hepatic encephalopathy        Follow-up Information     Raulkar, Drema Pry, MD Follow  up.   Specialty: Physical Medicine and Rehabilitation Why: Office to call for appointment Contact information: [address] [place] [address] [phone]         Bennie Dallas, MD Follow up.   Specialties: Interventional Radiology, Diagnostic Radiology, Radiology Why: Call for appointment to schedule paracentesis 01/27/2023 Contact information: [address] [place] [address] [phone]         Dolores Frame, MD Follow up.   Specialty: Gastroenterology Why: Call for appointment Contact information: [address] [place] [address] [phone]         Carnella Guadalajara, PA-C Follow up.   Specialty: Oncology Why: Call for appointment Contact information: [address] [place] [address] [phone]                 Signed: Mcarthur Rossetti Shatana Saxton 01/26/2023, 4:58 AM

## 2023-01-22 NOTE — Progress Notes (Signed)
Physical Therapy Session Note  Patient Details  Name: Rachel Huber MRN: [id number] Date of Birth: May 20, 1956  Today's Date: 01/22/2023 PT Individual Time: 7829-5621 PT Individual Time Calculation (min): 69 min   Short Term Goals: Week 1:  PT Short Term Goal 1 (Week 1): STG's=LTG's PT Short Term Goal 1 - Progress (Week 1): Progressing toward goal  Skilled Therapeutic Interventions/Progress Updates:   Received pt semi-reclined in bed on phone with dentist. Pt agreeable to PT treatment  and denied any pain at rest. Session with emphasis on functional mobility/transfers, generalized strengthening and endurance, dynamic standing balance/coordination, and gait training. Pt required increased time with all mobility and need for frequent rest breaks due to global weakness/deconditioning. Pt transferred semi-reclined<>sitting EOB with HOB elevated and supervision. Donned L post op shoe and compression sock (due to edema) and R shoe with max A. Discussed follow up therapy with pt requesting to return to OPPT in [place].    Pt performed all transfers with rollator and close supervision throughout session - min cues to lock brakes prior to sitting. Pt ambulated 77ft x 2 trials with rollator and close supervision to main therapy gym with 1 seated rest break on rollator. Pt with 1 LOB requiring min A to correct when reaching into rollator to answer phone call - cues for safety awareness to sit on rollator while talking. Pt then performed the following exercises with emphasis on LE strength/ROM: -standing alternating marches with 1.5lb ankle weight 2x10 bilaterally -standing hip abduction with 1.5lb ankle weights 2x10 bilaterally -standing mini squats 3x8 -standing hamstring curls with 1.5lb ankle weight 2x10 MD arrived for morning rounds during seated rest break. Pt stood without AD and CGA and worked on dynamic standing balance, reaction time, and coordination tossing ball against rebounder 3x20 with CGA  for balance - limited by back pain (secure chatted MD to request k-pad). NT arrived and requested pt return to room to have blood drawn. Stood with rollator and close supervision and ambulated 152ft with rollator and supervision back to room. Concluded session with pt sitting in recliner, needs within reach, and chair pad alarm on. Laboratory present at bedside.   Therapy Documentation Precautions:  Precautions Precautions: Fall Precaution Comments: post-op shoe on the left for reported skin injury-heel wound Restrictions Weight Bearing Restrictions: No Other Position/Activity Restrictions: post op shoe on Lt foot  Therapy/Group: Individual Therapy [name] [name] PT, DPT 01/22/2023, 7:08 AM

## 2023-01-23 LAB — AMMONIA: Ammonia: 62 umol/L — ABNORMAL HIGH (ref 9–35)

## 2023-01-23 MED ORDER — ORAL CARE MOUTH RINSE: 15.0000 mL | OROMUCOSAL | Status: DC | PRN

## 2023-01-23 NOTE — Progress Notes (Signed)
Physical Therapy Discharge Summary  Patient Details  Name: Rachel Huber MRN: [id number] Date of Birth: 1956-02-01  Date of Discharge from PT service:January 25, 2023  {CHL IP REHAB PT TIME CALCULATION:304800500}   Patient has met {NUMBERS 0-12:18577} of {NUMBERS 0-12:18577} long term goals due to {due WU:9811914}.  Patient to discharge at [facility] level {LOA:3049010}.   Patient's care partner {care partner:3041650} to provide the necessary {assistance:3041652} assistance at discharge.  Reasons goals not met: ***  Recommendation:  Patient will benefit from ongoing skilled PT services in {setting:3041680} to continue to advance safe functional mobility, address ongoing impairments in ***, and minimize fall risk.  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 Occasionally Pain Interference with Therapy Activities: 1. Rarely or not at all Pain Interference with Day-to-Day Activities: 1. Rarely or not at all Vision/Perception  Vision - History Ability to See in Adequate Light: 0 Adequate Perception Perception: Within Functional Limits Praxis Praxis: Intact  Cognition Orientation Level: Oriented X4 Sensation Sensation Light Touch: Impaired Detail Hot/Cold: Not tested Proprioception: Appears Intact Stereognosis: Not tested Additional Comments: absent sensation along bilateral great toes. Hx of peripheral neuropathy bilateral LEs Coordination Gross Motor Movements are Fluid and Coordinated: Yes Fine Motor Movements  are Fluid and Coordinated: Yes Coordination and Movement Description: slow but WFL Finger Nose Finger Test: LUE with mild tremor, however WFL Heel Shin Test: [facility] bilaterally Motor     Mobility   Locomotion     Trunk/Postural Assessment     Balance   Extremity Assessment  RUE Assessment RUE Assessment: Exceptions to [facility] Active Range of Motion (AROM) Comments: limited to 90 degrees shoulder flexion otherwise WFL; history of reverse shoulder replacement General Strength Comments: 3+/5 shoulder, 4/5 distally LUE Assessment LUE Assessment: Exceptions to [facility] Active Range of Motion (AROM) Comments: WFL General Strength Comments: 4-/5 grossly RLE Assessment RLE Assessment: Exceptions to [facility] General Strength Comments: tested sitting EOM RLE Strength Right Hip Flexion: 4+/5 Right Hip ABduction: 4+/5 Right Hip ADduction: 4/5 Right Knee Flexion: 4+/5 Right Knee Extension: 4+/5 Right Ankle Dorsiflexion: 4+/5 Right Ankle Plantar Flexion: 4/5 LLE Assessment LLE Assessment: Exceptions to [facility] General Strength Comments: tested sitting EOM LLE Strength Left Hip Flexion: 4/5 Left Hip ABduction: 4/5 Left Hip ADduction: 4/5 Left Knee Flexion: 4+/5 Left Knee Extension: 4/5 Left Ankle Dorsiflexion: 3+/5 Left Ankle Plantar Flexion: 4-/5   Jayra Choyce Zaunegger 01/23/2023, 12:35 PM

## 2023-01-23 NOTE — Progress Notes (Signed)
Occupational Therapy Session Note  Patient Details  Name: Rachel Huber MRN: [id number] Date of Birth: Mar 05, 1956  Today's Date: 01/23/2023 OT Individual Time: 0805-0900 & 1305-1400 OT Individual Time Calculation (min): 55 min & 55 min    Short Term Goals: Week 1:  OT Short Term Goal 1 (Week 1): STG = LTG due to ELOS  Skilled Therapeutic Interventions/Progress Updates:  Session 1 Skilled OT intervention completed with focus on ADL retraining, functional ambulation. Pt received upright in bed, agreeable to session. Unrated soreness in BLE from "exercises yesterday." Pt declined pain meds, however OT offered rest breaks, repositioning throughout for pain reduction.  Pt politely declined a full shower, however agreeable to sponge bath. Completed bed mobility with supervision to EOB, donned L DARCO shoe with total A for time, then supervision sit > stand using rollator and supervision ambulatory transfer to sink.  Seated in recliner, pt completed UB bathing with intermittent supervision. Pt continues to demonstrate delayed processing and initiation, with cues frequently needed for task progression and sequencing. Wash LB with supervision at [facility] sit > stand level with UE on sink for balance. Cues needed for sitting to remove LB clothing off of feet, then for doffing DARCO on L foot for increased independence with threading LB clothing. Donned UB bra/shirt with set up A. Assist provided for LLE dressing due to time constraint and pt report of urgent need for void.   Education provided on energy conservation strategies for full showers at home. Pt reports husband will assist with showers at mod A despite CLOF of supervision.  Ambulated with supervision using rollator to commode. Handled all toileting needs with distant supervision, then ambulated back to recliner. Pt remained seated in recliner, with chair alarm on/activated, and with all needs in reach at end of session.  Session 2 Skilled OT  intervention completed with focus on tub/shower transfers, ambulatory endurance, cardiovascular endurance. Pt received upright in bed, agreeable to session. No pain reported.  Transitioned to EOB with mod I. Donned Rt shoe, Lt DARCO with total A for time. Completed all sit > stands and ambulatory transfers using rollator with supervision, no LOB.  Ambulated to ADL bathroom. Pt has TTB at baseline, with ability to demo with min cues for positioning a supervision level sit pivot method. Pt expressed that PTA she has been unable to lift BLE over threshold but reports much easier now. Education provided on suggested use of [facility] for ease of bathing, shower curtain management to prevent water spillage, minimizing stands via lateral leans for pericare, use of grab bars/installation as well as effective safety strategies for exiting the shower to eliminate falls. Able to exit in similar manner.  Completed 15 mins on nustep level 8, without rest breaks to address global endurance needed for BADLs and ambulatory transfers. Has greatly increased her endurance level with this particular activity and it is preferred by pt.  Ambulated back to room, completed all toileting needs with distant supervision for continent void. Requested to return to bed, ambulated to EOB. Removed both shoes with supervision. Mod I bed mobility to upright in bed, able to scoot back posteriorly with mod I though pt did request trendelenburg position but advised pt to avoid this and use other ways as pt does not have this feature at home.  Pt remained upright in bed, with bed alarm on/activated, BLE on pillows per request for edema management and with all needs in reach at end of session.   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

## 2023-01-23 NOTE — Progress Notes (Signed)
Occupational Therapy Discharge Summary  Patient Details  Name: Rachel Huber MRN: [id number] Date of Birth: 1956-05-21  Today's Date: 01/25/2023 OT Individual Time: 1027-2536 OT Individual Time Calculation (min): 115 min   Date of Discharge from OT service:January 25, 2023  Patient has met 8 of 8 long term goals due to improved activity tolerance, improved balance, functional use of  LEFT lower extremity, and improved coordination.  Patient to discharge at overall Modified Independent level.  Patient's care partner is independent to provide the necessary physical assistance at discharge. Pt's husband was assisting with self-care at min/mod A level therefore did not complete hands on education.  All goals met  Recommendation:  Patient will benefit from ongoing skilled OT services in home health setting to continue to advance functional skills in the area of BADL, iADL, and Reduce care partner burden.  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 Restrictions Weight Bearing Restrictions: No ADL ADL Eating: Modified independent Where Assessed-Eating: Chair Grooming: Modified independent Where Assessed-Grooming: Sitting at sink Upper Body Bathing: Modified independent Where Assessed-Upper Body Bathing: Sitting at sink Lower Body Bathing: Supervision/safety Where Assessed-Lower Body Bathing: Sitting at sink, Standing at sink Upper Body Dressing: Modified independent (Device) Where Assessed-Upper Body Dressing: Sitting at sink Lower Body Dressing: Supervision/safety Where Assessed-Lower Body Dressing: Sitting at sink, Standing at sink Toileting: Modified independent Where Assessed-Toileting: Neurosurgeon  Method: Proofreader: Raised toilet seat, Grab bars, Other (comment) (rollator) Tub/Shower Transfer: Close supervison Web designer Method: Ambulating, Sit pivot Tub/Shower Equipment: Insurance underwriter: Unable to assess Film/video editor Method: Unable to assess Vision Baseline Vision/History: 1 Wears glasses Patient Visual Report: No change from baseline Vision Assessment?: No apparent visual deficits Perception  Perception: Within Functional Limits Praxis Praxis: Intact Cognition Cognition Overall Cognitive Status: Within Functional Limits for tasks assessed Arousal/Alertness: Awake/alert Orientation Level: Person;Place;Situation Memory: Appears intact Awareness: Appears intact Problem Solving: Appears intact Safety/Judgment: Appears intact Brief Interview for Mental Status (BIMS) Repetition of Three Words (First Attempt): 3 Temporal Orientation: Year: Correct Temporal Orientation: Month: Accurate within 5 days Temporal Orientation: Day: Correct Recall: "Sock": Yes, no cue required Recall: "Blue": Yes, no cue required Recall: "Bed": Yes, no cue required BIMS Summary Score: 15 Sensation Sensation Light Touch: Appears Intact Hot/Cold: Appears Intact Proprioception: Appears Intact Stereognosis: Not tested Additional Comments: hx of peripheral neuropathy bil Bil LE Coordination Gross Motor Movements are Fluid and Coordinated: Yes Fine Motor Movements are Fluid and Coordinated: Yes Coordination and Movement Description: slow but WFL Finger Nose Finger Test: LUE with mild tremor, however WFL Motor  Motor Motor: Within Functional Limits Motor - Skilled Clinical Observations: generalized weakness/deconditioning Mobility  Bed Mobility Bed Mobility: Rolling Right;Rolling Left;Sit to Supine;Supine to Sit Rolling Right: Independent with assistive device Rolling Left: Independent with assistive device Supine to  Sit: Independent with assistive device Sit to Supine: Independent with assistive device Transfers Sit to Stand: Independent with assistive device Stand to Sit: Independent with assistive device  Trunk/Postural Assessment  Cervical Assessment Cervical Assessment: Exceptions to [facility] (forward head) Thoracic Assessment Thoracic Assessment: Exceptions to [facility] (thoracic rounding) Lumbar Assessment Lumbar Assessment: Exceptions to [facility] (posterior pelvic tilt) Postural Control Postural Control: Within Functional Limits  Balance Balance Balance Assessed: Yes Static Sitting Balance Static Sitting - Balance Support:  Feet supported;Bilateral upper extremity supported Static Sitting - Level of Assistance: 7: Independent Dynamic Sitting Balance Dynamic Sitting - Balance Support: No upper extremity supported;Feet supported Dynamic Sitting - Level of Assistance: 6: Modified independent (Device/Increase time) Static Standing Balance Static Standing - Balance Support: Bilateral upper extremity supported;During functional activity (rollator) Static Standing - Level of Assistance: 6: Modified independent (Device/Increase time) Dynamic Standing Balance Dynamic Standing - Balance Support: Bilateral upper extremity supported;During functional activity (rollator) Dynamic Standing - Level of Assistance: 6: Modified independent (Device/Increase time) Extremity/Trunk Assessment RUE Assessment RUE Assessment: Exceptions to [facility] Active Range of Motion (AROM) Comments: limited to 90 degrees shoulder flexion otherwise WFL; history of reverse shoulder replacement General Strength Comments: 3+/5 shoulder, 4/5 distally LUE Assessment LUE Assessment: Exceptions to [facility] Active Range of Motion (AROM) Comments: WFL General Strength Comments: 4-/5 grossly   Skilled Therapeutic Intervention:  Pt received sitting in recliner for skilled OT session with focus on BADL participation. Pt agreeable to interventions,  demonstrating overall pleasant mood. Pt with no reports of pain. OT offering intermediate rest breaks and positioning suggestions throughout session to address pain/fatigue and maximize participation/safety in session.   Pt ambulates into/out of bathroom with rollator and overall supervision, completing 3/3 toileting activities with same level of assistance. Pt requires extensive time for continent BM. Seated EOB, pt threads PJ pants, standing with bed-elevated to don over bottom/hips all with supervision. Pt dons UB garments with Mod I.   Pt then ambulates from room<>main therapy gym with supervision + rollator. In main therapy gym, pt and OT review HEP, focus placed on modifying exercises for RUE due to decreased ROM. Pt completes 10 reps/1 set of follow exercises with yellow theraband:  -Chest-level Ab/adduction -Shoulder Flexion/Extension -Seated Row -Diagonal Ab/adduction  -Bicep Curls -Punches -Internal Rotation   Pt completes 1 set/8-10 reps of each exercise with yellow theraband, requiring multi-modal cuing for correct form.   Back in patient room, pt with urgency, remained sitting on BSC, NT aware.   Lou Cal, OTR/L, MSOT   [facility] E Kluttz 01/23/2023, 9:02 AM

## 2023-01-23 NOTE — Discharge Instructions (Addendum)
Inpatient Rehab Discharge Instructions  [facility] Discharge date and time: No discharge date for patient encounter.   Activities/Precautions/ Functional Status: Activity: As tolerated Diet: Regular Wound Care: Routine skin checks Functional status:  ___ No restrictions     ___ Walk up steps independently ___ 24/7 supervision/assistance   ___ Walk up steps with assistance ___ Intermittent supervision/assistance  ___ Bathe/dress independently ___ Walk with walker     _x__ Bathe/dress with assistance ___ Walk Independently    ___ Shower independently ___ Walk with assistance    ___ Shower with assistance ___ No alcohol     ___ Return to work/school ________  Special Instructions: No driving smoking or alcohol   My questions have been answered and I understand these instructions. I will adhere to these goals and the provided educational materials after my discharge from the hospital.  Patient/Caregiver Signature _______________________________ Date __________  Clinician Signature _______________________________________ Date __________  Please bring this form and your medication list with you to all your follow-up doctor's appointments. COMMUNITY REFERRALS UPON DISCHARGE:     Outpatient: PT     OT                Agency: Outpatient at [facility] Phone: [phone]              Appointment Date/Time: TBD

## 2023-01-23 NOTE — Progress Notes (Signed)
PROGRESS NOTE   Subjective/Complaints: No new complaints this morning She asks about her ammonia and Hgb levels and discussed these are improving, will repeat ammonia today   Objective: ROS: +does not like the protein supplements, denies pain, denies fatigue, confusion resolved   No results found. Recent Labs    01/22/23 0952  HGB 8.9*  HCT 27.2*   No results for input(s): "NA", "K", "CL", "CO2", "GLUCOSE", "BUN", "CREATININE", "CALCIUM" in the last 72 hours.   Intake/Output Summary (Last 24 hours) at 01/23/2023 1052 Last data filed at 01/22/2023 1850 Gross per 24 hour  Intake 720 ml  Output --  Net 720 ml        Physical Exam: Vital Signs Blood pressure (!) 123/51, pulse 86, temperature 98.8 F (37.1 C), temperature source Oral, resp. rate 18, weight 98.2 kg, SpO2 95 %. Gen: no distress, normal appearing HEENT: oral mucosa pink and moist, NCAT Cardio: Reg rate Chest: normal effort, normal rate of breathing Abd: soft, non-distended Ext: no edema Psych: pleasant, normal affect Skin:    General: Skin is warm and dry.     Comments: Dry flaky skin on BLE with tenting and stasis changes.   Neurological:     Mental Status: She is alert.     Comments: Slow to process with delayed output and needs redirection at times.     Neurologic:response time  is slow but is good historian, oriented x 3  motor strength is 5/5 in bilateral deltoid, bicep, tricep, grip, hip flexor, knee extensors, ankle dorsiflexor and plantar flexor Sensory exam normal sensation to light touch and proprioception in bilateral upper and lower extremities No UE tremor  Musculoskeletal: Full range of motion in all 4 extremities. No joint swelling  Ambulating in hallway with rollator  Assessment/Plan: 1. Functional deficits which require 3+ hours per day of interdisciplinary therapy in a comprehensive inpatient rehab setting. Physiatrist is providing  close team supervision and 24 hour management of active medical problems listed below. Physiatrist and rehab team continue to assess barriers to discharge/monitor patient progress toward functional and medical goals  Care Tool:  Bathing    Body parts bathed by patient: Right arm, Left arm, Chest, Abdomen, Right upper leg, Left upper leg, Face   Body parts bathed by helper: Right lower leg, Left lower leg, Front perineal area, Buttocks     Bathing assist Assist Level: Maximal Assistance - Patient 24 - 49%     Upper Body Dressing/Undressing Upper body dressing   What is the patient wearing?: Pull over shirt    Upper body assist Assist Level: Minimal Assistance - Patient > 75%    Lower Body Dressing/Undressing Lower body dressing      What is the patient wearing?: Underwear/pull up, Pants     Lower body assist Assist for lower body dressing: Moderate Assistance - Patient 50 - 74%     Toileting Toileting    Toileting assist Assist for toileting: Supervision/Verbal cueing     Transfers Chair/bed transfer  Transfers assist     Chair/bed transfer assist level: Supervision/Verbal cueing     Locomotion Ambulation   Ambulation assist      Assist level: Supervision/Verbal cueing  Assistive device: Rollator Max distance: 159ft   Walk 10 feet activity   Assist     Assist level: Supervision/Verbal cueing Assistive device: Rollator   Walk 50 feet activity   Assist Walk 50 feet with 2 turns activity did not occur: Safety/medical concerns  Assist level: Supervision/Verbal cueing Assistive device: Rollator    Walk 150 feet activity   Assist Walk 150 feet activity did not occur: Safety/medical concerns  Assist level: Supervision/Verbal cueing Assistive device: Rollator    Walk 10 feet on uneven surface  activity   Assist     Assist level: Supervision/Verbal cueing Assistive device: Rollator   Wheelchair     Assist Is the patient using a  wheelchair?: No Type of Wheelchair: Manual    Wheelchair assist level: Dependent - Patient 0%      Wheelchair 50 feet with 2 turns activity    Assist            Wheelchair 150 feet activity     Assist      Assist Level: Dependent - Patient 0%   Blood pressure (!) 123/51, pulse 86, temperature 98.8 F (37.1 C), temperature source Oral, resp. rate 18, weight 98.2 kg, SpO2 95 %.  Medical Problem List and Plan: 1. Functional deficits secondary to acute hepatic encephalopathy s/p  splenic emobolization with recurrent anemia             -patient may  shower             -ELOS/Goals: 7 d  Therapy notes reviewed  Discussed moving d/c date to Monday so she can attend outpatient paracentesis on Tuesday  2. Hypotension: continues teds as needed  3. Pain: Voltaren gel, continue Neurontin 400 mg twice daily  4. Depression: continue Zoloft 50 mg daily             -antipsychotic agents: N/A             --denies insomnia.  5. Neuropsych/cognition: This patient may be intermittently capable of making decisions on her own behalf.             --reports cognition has improved in the past week.  6. Skin/Wound Care: Left foot ulceration followed by podiatry services.  Wound care as directed.  Routine skin checks 7. Fluids/Electrolytes/Nutrition: Routine in and outs with follow-up chemistries 8.  Acute on chronic anemia/pancytopenia: Hx of thalassemia intermedia, hems, s/p splenic embolization.    --S/p  multiple admission w/multiple transfusions --Iron infusion held due to concerns of overload due to  multiple transfusion.  Will check stool guaic  Hgb reviewed and is improving  9.  CKD stage III.  Baseline SCr-  10.  NASH w/cirrhosis, Hems, ascites: Lactulose TID--has refused daily since 01/12/23.  --Add low salt restrictions and daily weights.   --Ammonia reviewed and is downtrending --Continue torsemide and aldactone daily to help manage ascites.  -moved discharge up to  Monday so she can attend outpatient paracentesis on Tuesday at 10:30am  11.  Hyperlipidemia.  Continue Lipitor  12.  Chronic orthostasis. Midodrine was discontinued on 05/28 as was being held.  --Will order orthostatic BP. Add TED hose   13. Hyponatremia/Hypokalemia: supplement klor on 6/4, d/c scheduled supplement   14. Hyperammonia: discussed that level increased today and that lactulose will help with this  15. Suboptimal vitamin D with history of insufficiency: continue ergocalciferol 50,000U once per week for 7 weeks  16. Hypoalbuminemia: encouraged consuming protein in her diet  17. Hypotension: decrease torsemide to  30mg  daily. Monitor daily weights  LOS: 4 days A FACE TO FACE EVALUATION WAS PERFORMED  Drema Pry Merrit Waugh 01/23/2023, 10:52 AM

## 2023-01-23 NOTE — Progress Notes (Signed)
Physical Therapy Session Note  Patient Details  Name: Rachel Huber MRN: [id number] Date of Birth: April 08, 1956  Today's Date: 01/23/2023 PT Individual Time: 4403-4742 PT Individual Time Calculation (min): 70 min   Short Term Goals: Week 1:  PT Short Term Goal 1 (Week 1): STG's=LTG's PT Short Term Goal 1 - Progress (Week 1): Progressing toward goal  Skilled Therapeutic Interventions/Progress Updates:   Received pt sitting in recliner asleep. Upon wakening, pt falling in/out of sleep mid-conversation and while taking sips from water bottle, reporting fatigue from bathing with OT. Pt agreeable to PT treatment and denied any pain during session. Session with emphasis on discharge planning, functional mobility/transfers, generalized strengthening and endurance, dynamic standing balance/coordination, stair navigation, simulated car transfers, and gait training. Pt performed all transfers with rollator and supervision throughout session. Required x 3 attempts to stand from low sitting recliner with rollator and ambulated 48ft with rollator and supervision to ortho gym - cues to lock brakes prior to sitting. Pt performed ambulatory simulated car transfer from truck height with rollator and supervision with increased time as pt moving very slow this morning. Pt then ambulated 65ft on uneven surfaces (ramp) with rollator and supervision/mod I. Went through sensation, MMT, and pain interference questionnaire in preparation for discharge. Pt ambulated 48ft with rollator and supervision to main therapy gym and took seated rest break on rollator.   Pt reports having 1+1 3in thresholds to enter home and previously was lifting rollator onto step, having husband stabilize it, and then stepping up - suggested pt continue with this technique. Pt then navigated 4 6in steps with bilateral handrails and CGA/close supervision ascending and descending with a step to pattern. Inside // bars, pt worked on Glass blower/designer with  1UE support and close supervision x 4 laps and lateral stepping without UE support x 4 laps with emphasis on hip abductor and hip flexor strength. Pt then ambulated 169ft with rollator and supervision back to room and into bathroom. Pt able to manage clothing with supervision and left seated on commode with all needs within reach and RN/NT notified and attending to care.     Therapy Documentation Precautions:  Precautions Precautions: Fall Precaution Comments: post-op shoe on the left for reported skin injury-heel wound Restrictions Weight Bearing Restrictions: No Other Position/Activity Restrictions: post op shoe on Lt foot  Therapy/Group: Individual Therapy Jailyne Chieffo [name] PT, DPT 01/23/2023, 6:49 AM

## 2023-01-23 NOTE — Progress Notes (Signed)
Inpatient Rehabilitation Care Coordinator Discharge Note   Patient Details  Name: ALINNA SIPLE MRN: [id number] Date of Birth: 10-Mar-1956   Discharge location: Home  Length of Stay: 7 Days  Discharge activity level: Sup/cga  Home/community participation: Spouse  Patient response QM:VHQION Literacy - How often do you need to have someone help you when you read instructions, pamphlets, or other written material from your doctor or pharmacy?: Rarely  Patient response GE:XBMWUX Isolation - How often do you feel lonely or isolated from those around you?: Never  Services provided included: MD, RD, PT, OT, SLP, RN, TR, CM, Pharmacy, Neuropsych, SW  Financial Services:  Financial Services Utilized: Medicare    Choices offered to/list presented to: patient  Follow-up services arranged:  Outpatient, DME    Outpatient Servicies: OP at [facility] DME : none    Patient response to transportation need: Is the patient able to respond to transportation needs?: Yes In the past 12 months, has lack of transportation kept you from medical appointments or from getting medications?: No In the past 12 months, has lack of transportation kept you from meetings, work, or from getting things needed for daily living?: No   Patient/Family verbalized understanding of follow-up arrangements:  Yes  Individual responsible for coordination of the follow-up plan: self or spouse  Confirmed correct DME delivered: Andria Rhein 01/23/2023    Comments (or additional information):  Summary of Stay    Date/Time Discharge Planning CSW  01/20/23 1429 Patient anticipates discharging home with spouse to assist and provide 24/7 supervision. CJB       Andria Rhein

## 2023-01-24 LAB — CULTURE, BODY FLUID W GRAM STAIN -BOTTLE: Culture: NO GROWTH

## 2023-01-24 NOTE — Progress Notes (Signed)
PROGRESS NOTE   Subjective/Complaints: No new complaints this morning  +Orthostats systolic this AM.  Patient states she was asymptomatic, that her normal is diastolic 40s to 50s.  She was working hard on intaking enough fluids and feels she is currently at her baseline.  Only concern is hemoglobin, which was not monitored today but overall has been stable.  +BM overnight, large No incontinence.  Ammonia stable 54->62 yesterday.   Objective:  ROS: Denies fevers, chills, N/V, abdominal pain, constipation, diarrhea, SOB, cough, chest pain, new weakness or paraesthesias.     No results found. Recent Labs    01/22/23 0952  HGB 8.9*  HCT 27.2*    No results for input(s): "NA", "K", "CL", "CO2", "GLUCOSE", "BUN", "CREATININE", "CALCIUM" in the last 72 hours.   Intake/Output Summary (Last 24 hours) at 01/24/2023 0843 Last data filed at 01/24/2023 0828 Gross per 24 hour  Intake 598 ml  Output --  Net 598 ml         Physical Exam: Vital Signs Blood pressure (!) 121/54, pulse 83, temperature 99 F (37.2 C), temperature source Oral, resp. rate 17, weight 98.8 kg, SpO2 95 %. Gen: no distress, normal appearing.  Laying in bed HEENT: oral mucosa pink and moist, NCAT Cardio: Reg rate Chest: normal effort, normal rate of breathing Abd: soft, non-distended Ext: no edema Psych: pleasant, normal affect Skin:    General: Skin is warm and dry.     Comments: Dry flaky skin on BLE with tenting and stasis changes.  -Ongoing Neurological:     Mental Status: She is alert.     Comments: Slow to process with delayed output and needs redirection at times.  -Appropriate on today's exam, mild processing delay but no redirection Neurologic: oriented x 3  motor strength is 5/5 throughout sensory exam normal sensation to light touch and proprioception in bilateral upper and lower extremities No UE tremor  Musculoskeletal: Full range of  motion in all 4 extremities. No joint swelling   Assessment/Plan: 1. Functional deficits which require 3+ hours per day of interdisciplinary therapy in a comprehensive inpatient rehab setting. Physiatrist is providing close team supervision and 24 hour management of active medical problems listed below. Physiatrist and rehab team continue to assess barriers to discharge/monitor patient progress toward functional and medical goals  Care Tool:  Bathing    Body parts bathed by patient: Right arm, Left arm, Chest, Abdomen, Right upper leg, Left upper leg, Face, Front perineal area, Buttocks, Right lower leg, Left lower leg   Body parts bathed by helper: Right lower leg, Left lower leg, Front perineal area, Buttocks     Bathing assist Assist Level: Supervision/Verbal cueing     Upper Body Dressing/Undressing Upper body dressing   What is the patient wearing?: Pull over shirt    Upper body assist Assist Level: Set up assist    Lower Body Dressing/Undressing Lower body dressing      What is the patient wearing?: Underwear/pull up, Pants     Lower body assist Assist for lower body dressing: Supervision/Verbal cueing     Toileting Toileting    Toileting assist Assist for toileting: Independent with assistive device  Transfers Chair/bed transfer  Transfers assist     Chair/bed transfer assist level: Supervision/Verbal cueing     Locomotion Ambulation   Ambulation assist      Assist level: Supervision/Verbal cueing Assistive device: Rollator Max distance: 17ft   Walk 10 feet activity   Assist     Assist level: Supervision/Verbal cueing Assistive device: Rollator   Walk 50 feet activity   Assist Walk 50 feet with 2 turns activity did not occur: Safety/medical concerns  Assist level: Supervision/Verbal cueing Assistive device: Rollator    Walk 150 feet activity   Assist Walk 150 feet activity did not occur: Safety/medical concerns  Assist  level: Supervision/Verbal cueing Assistive device: Rollator    Walk 10 feet on uneven surface  activity   Assist     Assist level: Supervision/Verbal cueing Assistive device: Rollator   Wheelchair     Assist Is the patient using a wheelchair?: No Type of Wheelchair: Manual    Wheelchair assist level: Dependent - Patient 0%      Wheelchair 50 feet with 2 turns activity    Assist            Wheelchair 150 feet activity     Assist      Assist Level: Dependent - Patient 0%   Blood pressure (!) 121/54, pulse 83, temperature 99 F (37.2 C), temperature source Oral, resp. rate 17, weight 98.8 kg, SpO2 95 %.  Medical Problem List and Plan: 1. Functional deficits secondary to acute hepatic encephalopathy s/p  splenic emobolization with recurrent anemia             -patient may  shower             -ELOS/Goals: 7 d  Therapy notes reviewed  Discussed moving d/c date to Monday so she can attend outpatient paracentesis on Tuesday  2. Hypotension: continues teds as needed  3. Pain: Voltaren gel, continue Neurontin 400 mg twice daily  4. Depression: continue Zoloft 50 mg daily             -antipsychotic agents: N/A             --denies insomnia.  5. Neuropsych/cognition: This patient may be intermittently capable of making decisions on her own behalf.             --reports cognition has improved in the past week.  6. Skin/Wound Care: Left foot ulceration followed by podiatry services.  Wound care as directed.  Routine skin checks 7. Fluids/Electrolytes/Nutrition: Routine in and outs with follow-up chemistries 8.  Acute on chronic anemia/pancytopenia: Hx of thalassemia intermedia, hems, s/p splenic embolization.    --S/p  multiple admission w/multiple transfusions --Iron infusion held due to concerns of overload due to  multiple transfusion.  Will check stool guaic  Hgb reviewed and is improving  9.  CKD stage III.  Baseline SCr-  10.  NASH w/cirrhosis,  Hems, ascites: Lactulose TID--has refused daily since 01/12/23.  --Add low salt restrictions and daily weights.   --Ammonia reviewed and is downtrending --Continue torsemide and aldactone daily to help manage ascites.  -moved discharge up to Monday so she can attend outpatient paracentesis on Tuesday at 10:30am  11.  Hyperlipidemia.  Continue Lipitor  12.  Chronic orthostasis. Midodrine was discontinued on 05/28 as was being held.  --Will order orthostatic BP. Add TED hose   13. Hyponatremia/Hypokalemia: supplement klor on 6/4, d/c scheduled supplement   14. Hyperammonia: discussed that level increased today and that lactulose will help  with this  15. Suboptimal vitamin D with history of insufficiency: continue ergocalciferol 50,000U once per week for 7 weeks  16. Hypoalbuminemia: encouraged consuming protein in her diet  17. Hypotension: decrease torsemide to 30mg  daily. Monitor daily weights -Weights approximately stable, continue to monitor -Orthostats positive this a.m., patient endorses she is at her normal blood pressure and asymptomatic; continue to encourage fluids and monitor Filed Weights   01/22/23 0325 01/23/23 0352 01/24/23 0500  Weight: 97.5 kg 98.2 kg 98.8 kg     LOS: 5 days A FACE TO FACE EVALUATION WAS PERFORMED  Angelina Sheriff 01/24/2023, 8:43 AM

## 2023-01-24 NOTE — Progress Notes (Signed)
Physical Therapy Session Note  Patient Details  Name: Rachel Huber MRN: [id number] Date of Birth: [birth year]  Today's Date: 01/24/2023 PT Individual Time: 1301-1356 PT Individual Time Calculation (min): 55 min   Short Term Goals: Week 1:  PT Short Term Goal 1 (Week 1): STG's=LTG's PT Short Term Goal 1 - Progress (Week 1): Progressing toward goal  Skilled Therapeutic Interventions/Progress Updates:   Received pt sitting upright in bed finishing lunch. Pt agreeable to PT treatment and denied any pain during session. Session with emphasis on functional mobility/transfers, generalized strengthening and endurance, toileting, and gait training. Donned R shoe and L post-op shoe with max A while pt ate. Pt required increased time to get moving this afternoon, but able to transfer semi-reclined<>sitting EOB with HOB elevated and use of bedrails with mod I. Pt performed all transfers with rollator and mod I throughout session. Pt ambulated 145ft x 2 trials with rollator and mod I to/from main therapy gym with increased time. Pt able to stand with rollator and pick up object from floor using reacher and mod I. Pt then performed the following exercises inside // bars with BUE support and emphasis on LE strength: -mini squats with red TB 2x10 -marches with red TB 2x10 Returned to room and ambulated into bathroom with rollator and mod I - pt left in care of NT due to time restrictions.  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

## 2023-01-25 NOTE — Plan of Care (Signed)
  Problem: RH Balance Goal: LTG Patient will maintain dynamic standing with ADLs (OT) Description: LTG:  Patient will maintain dynamic standing balance with assist during activities of daily living (OT)  Outcome: Completed/Met   Problem: Sit to Stand Goal: LTG:  Patient will perform sit to stand in prep for activites of daily living with assistance level (OT) Description: LTG:  Patient will perform sit to stand in prep for activites of daily living with assistance level (OT) Outcome: Completed/Met   Problem: RH Bathing Goal: LTG Patient will bathe all body parts with assist levels (OT) Description: LTG: Patient will bathe all body parts with assist levels (OT) Outcome: Completed/Met   Problem: RH Dressing Goal: LTG Patient will perform upper body dressing (OT) Description: LTG Patient will perform upper body dressing with assist, with/without cues (OT). Outcome: Completed/Met Goal: LTG Patient will perform lower body dressing w/assist (OT) Description: LTG: Patient will perform lower body dressing with assist, with/without cues in positioning using equipment (OT) Outcome: Completed/Met   Problem: RH Toileting Goal: LTG Patient will perform toileting task (3/3 steps) with assistance level (OT) Description: LTG: Patient will perform toileting task (3/3 steps) with assistance level (OT)  Outcome: Completed/Met   Problem: RH Toilet Transfers Goal: LTG Patient will perform toilet transfers w/assist (OT) Description: LTG: Patient will perform toilet transfers with assist, with/without cues using equipment (OT) Outcome: Completed/Met   Problem: RH Tub/Shower Transfers Goal: LTG Patient will perform tub/shower transfers w/assist (OT) Description: LTG: Patient will perform tub/shower transfers with assist, with/without cues using equipment (OT) Outcome: Completed/Met   Problem: RH Balance Goal: LTG Patient will maintain dynamic standing with ADLs (OT) Description: LTG:  Patient will  maintain dynamic standing balance with assist during activities of daily living (OT)  Outcome: Completed/Met

## 2023-01-25 NOTE — Progress Notes (Signed)
Physical Therapy Session Note  Patient Details  Name: ANYSHA FRAPPIER MRN: [id number] Date of Birth: 05-18-1956  Today's Date: 01/25/2023 PT Individual Time: 1300-1415 PT Individual Time Calculation (min): 75 min   Short Term Goals: Week 1:  PT Short Term Goal 1 (Week 1): STG's=LTG's PT Short Term Goal 1 - Progress (Week 1): Progressing toward goal  Skilled Therapeutic Interventions/Progress Updates: Pt presents supine in bed and agreeable to therapy.  Pt transfers all surfaces throughout session w/ mod I, increased time to perform.  Pt amb > 150' multiple trials amb to main gym, to small gym and then returning to main gym and to room.  Pt negotiated 4 steps w/ B rails and CGA to supervision, step-to gait pattern.  Pt transferred in/out of truck height simulator w/ supervision to mod I.  Pt amb up/down ramped surface w/ mod I.  Pt performed amb in // bars w/o UE support, as well as sidestepping.  Pt amb to room and into BR , continent of bladder, charted in Flowsheets.  Pt returned to bed and transferred w/ Mod I.  Bed alarm on and all needs in reach.     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      Therapy/Group: Individual Therapy  Lucio Edward 01/25/2023, 2:20 PM

## 2023-01-25 NOTE — Progress Notes (Signed)
PROGRESS NOTE   Subjective/Complaints: No new complaints this morning.  Patient has no concerns regarding discharge tomorrow.  She does states she got a as needed medication for headache earlier this week that was not Tylenol, that was very effective, wondering if this can be prescribed as outpatient.  Reviewed medications with patient, only as needed Fioricet and not documented as given, patient is interested in a as needed prescription for this, will discuss with primary team.  Refused AM orthostats, vitals stable.  No concerns today.   Objective:  ROS: Denies fevers, chills, N/V, abdominal pain, constipation, diarrhea, SOB, cough, chest pain, new weakness or paraesthesias.     No results found. No results for input(s): "WBC", "HGB", "HCT", "PLT" in the last 72 hours.  No results for input(s): "NA", "K", "CL", "CO2", "GLUCOSE", "BUN", "CREATININE", "CALCIUM" in the last 72 hours.   Intake/Output Summary (Last 24 hours) at 01/25/2023 1012 Last data filed at 01/24/2023 1852 Gross per 24 hour  Intake 354 ml  Output --  Net 354 ml         Physical Exam: Vital Signs Blood pressure (!) 116/49, pulse 83, temperature 98.9 F (37.2 C), temperature source Oral, resp. rate 16, weight 98.8 kg, SpO2 93 %. Gen: no distress, normal appearing.  Laying in bed, well-appearing. +obese HEENT: oral mucosa pink and moist, NCAT Cardio: Reg rate Chest: normal effort, normal rate of breathing Abd: soft, non-distended Ext: no edema Psych: pleasant, normal affect Skin:    General: Skin is warm and dry.     Comments: Dry flaky skin on BLE with tenting and stasis changes.  -Ongoing Neurological:     Mental Status: She is alert.     Comments: Slow to process with delayed output and needs redirection at times.  -Appropriate on today's exam, mild processing delay but no redirection needed.  Appropriate with all responses. Neurologic: oriented x  3  motor strength is 5/5 throughout sensory exam normal sensation to light touch and proprioception in bilateral upper and lower extremities No UE tremor  Musculoskeletal: Full range of motion in all 4 extremities. No joint swelling   Assessment/Plan: 1. Functional deficits which require 3+ hours per day of interdisciplinary therapy in a comprehensive inpatient rehab setting. Physiatrist is providing close team supervision and 24 hour management of active medical problems listed below. Physiatrist and rehab team continue to assess barriers to discharge/monitor patient progress toward functional and medical goals  Care Tool:  Bathing    Body parts bathed by patient: Right arm, Left arm, Chest, Abdomen, Right upper leg, Left upper leg, Face, Front perineal area, Buttocks, Right lower leg, Left lower leg   Body parts bathed by helper: Right lower leg, Left lower leg, Front perineal area, Buttocks     Bathing assist Assist Level: Supervision/Verbal cueing     Upper Body Dressing/Undressing Upper body dressing   What is the patient wearing?: Pull over shirt    Upper body assist Assist Level: Set up assist    Lower Body Dressing/Undressing Lower body dressing      What is the patient wearing?: Underwear/pull up, Pants     Lower body assist Assist for lower body dressing:  Supervision/Verbal cueing     Toileting Toileting    Toileting assist Assist for toileting: Independent with assistive device     Transfers Chair/bed transfer  Transfers assist     Chair/bed transfer assist level: Independent with assistive device Chair/bed transfer assistive device: Other (rollator)   Locomotion Ambulation   Ambulation assist      Assist level: Independent with assistive device Assistive device: Rollator Max distance: 166ft   Walk 10 feet activity   Assist     Assist level: Independent with assistive device Assistive device: Rollator   Walk 50 feet  activity   Assist Walk 50 feet with 2 turns activity did not occur: Safety/medical concerns  Assist level: Independent with assistive device Assistive device: Rollator    Walk 150 feet activity   Assist Walk 150 feet activity did not occur: Safety/medical concerns  Assist level: Independent with assistive device Assistive device: Rollator    Walk 10 feet on uneven surface  activity   Assist     Assist level: Supervision/Verbal cueing Assistive device: Rollator   Wheelchair     Assist Is the patient using a wheelchair?: No Type of Wheelchair: Manual Wheelchair activity did not occur: N/A  Wheelchair assist level: Dependent - Patient 0%      Wheelchair 50 feet with 2 turns activity    Assist    Wheelchair 50 feet with 2 turns activity did not occur: N/A       Wheelchair 150 feet activity     Assist  Wheelchair 150 feet activity did not occur: N/A   Assist Level: Dependent - Patient 0%   Blood pressure (!) 116/49, pulse 83, temperature 98.9 F (37.2 C), temperature source Oral, resp. rate 16, weight 98.8 kg, SpO2 93 %.  Medical Problem List and Plan: 1. Functional deficits secondary to acute hepatic encephalopathy s/p  splenic emobolization with recurrent anemia             -patient may  shower             -ELOS/Goals: 7 d  Therapy notes reviewed  Discussed moving d/c date to Monday so she can attend outpatient paracentesis on Tuesday  2. Hypotension: continues teds as needed  3. Pain: Voltaren gel, continue Neurontin 400 mg twice daily  -Patient interested in as needed prescription for Fioricet on discharge; defer to primary team  4. Depression: continue Zoloft 50 mg daily             -antipsychotic agents: N/A             --denies insomnia.  5. Neuropsych/cognition: This patient may be intermittently capable of making decisions on her own behalf.             --reports cognition has improved in the past week.  6. Skin/Wound Care: Left  foot ulceration followed by podiatry services.  Wound care as directed.  Routine skin checks 7. Fluids/Electrolytes/Nutrition: Routine in and outs with follow-up chemistries 8.  Acute on chronic anemia/pancytopenia: Hx of thalassemia intermedia, hems, s/p splenic embolization.    --S/p  multiple admission w/multiple transfusions --Iron infusion held due to concerns of overload due to  multiple transfusion.  Will check stool guaic  Hgb reviewed and is improving  9.  CKD stage III.  Baseline SCr-  10.  NASH w/cirrhosis, Hems, ascites: Lactulose TID--has refused daily since 01/12/23.  --Add low salt restrictions and daily weights.   --Ammonia reviewed and is downtrending --Continue torsemide and aldactone daily to help manage  ascites.  -moved discharge up to Monday so she can attend outpatient paracentesis on Tuesday at 10:30am  11.  Hyperlipidemia.  Continue Lipitor  12.  Chronic orthostasis. Midodrine was discontinued on 05/28 as was being held.  --Will order orthostatic BP. Add TED hose   13. Hyponatremia/Hypokalemia: supplement klor on 6/4, d/c scheduled supplement   14. Hyperammonia: discussed that level increased today and that lactulose will help with this  -6/9: Patient endorsing bowel movements 3-4 times daily.  Did refuse lactulose last night, discussed compliance with this is part of her medication regimen. 15. Suboptimal vitamin D with history of insufficiency: continue ergocalciferol 50,000U once per week for 7 weeks  16. Hypoalbuminemia: encouraged consuming protein in her diet  17. Hypotension: decrease torsemide to 30mg  daily. Monitor daily weights -Weights approximately stable, continue to monitor -Orthostats positive this a.m., patient endorses she is at her normal blood pressure and asymptomatic; continue to encourage fluids and monitor -6/9: Mild increase in weights today.  Getting paracentesis Tuesday.  Vital stable, no concerns. Filed Weights   01/22/23 0325  01/23/23 0352 01/24/23 0500  Weight: 97.5 kg 98.2 kg 98.8 kg     LOS: 6 days A FACE TO FACE EVALUATION WAS PERFORMED  Angelina Sheriff 01/25/2023, 10:12 AM

## 2023-01-25 NOTE — Progress Notes (Signed)
Pt not wanting to do ortho vitals at this AM also Asked patient if she would like to get up to go to the bathroom pt fell back asleep, asked patient again if she would like to go to the bathroom and she declined at this time, and asked if we come back later.   Rayburn Ma

## 2023-01-26 ENCOUNTER — Inpatient Hospital Stay: Payer: Medicare Other

## 2023-01-26 LAB — CBC WITH DIFFERENTIAL/PLATELET
Abs Immature Granulocytes: 0.02 10*3/uL (ref 0.00–0.07)
Basophils Absolute: 0.1 10*3/uL (ref 0.0–0.1)
Basophils Relative: 1 %
Eosinophils Absolute: 0.4 10*3/uL (ref 0.0–0.5)
Eosinophils Relative: 6 %
HCT: 23.7 % — ABNORMAL LOW (ref 36.0–46.0)
Hemoglobin: 7.5 g/dL — ABNORMAL LOW (ref 12.0–15.0)
Immature Granulocytes: 0 %
Lymphocytes Relative: 17 %
Lymphs Abs: 1.1 10*3/uL (ref 0.7–4.0)
MCH: 23.1 pg — ABNORMAL LOW (ref 26.0–34.0)
MCHC: 31.6 g/dL (ref 30.0–36.0)
MCV: 73.1 fL — ABNORMAL LOW (ref 80.0–100.0)
Monocytes Absolute: 1.2 10*3/uL — ABNORMAL HIGH (ref 0.1–1.0)
Monocytes Relative: 19 %
Neutro Abs: 3.6 10*3/uL (ref 1.7–7.7)
Neutrophils Relative %: 57 %
Platelets: 232 10*3/uL (ref 150–400)
RBC: 3.24 MIL/uL — ABNORMAL LOW (ref 3.87–5.11)
RDW: 23.3 % — ABNORMAL HIGH (ref 11.5–15.5)
WBC: 6.3 10*3/uL (ref 4.0–10.5)
nRBC: 0 % (ref 0.0–0.2)

## 2023-01-26 LAB — BASIC METABOLIC PANEL
Anion gap: 8 (ref 5–15)
BUN: 34 mg/dL — ABNORMAL HIGH (ref 8–23)
CO2: 25 mmol/L (ref 22–32)
Calcium: 7.6 mg/dL — ABNORMAL LOW (ref 8.9–10.3)
Chloride: 100 mmol/L (ref 98–111)
Creatinine, Ser: 1.4 mg/dL — ABNORMAL HIGH (ref 0.44–1.00)
GFR, Estimated: 42 mL/min — ABNORMAL LOW (ref >60)
Glucose, Bld: 136 mg/dL — ABNORMAL HIGH (ref 70–99)
Potassium: 3.3 mmol/L — ABNORMAL LOW (ref 3.5–5.1)
Sodium: 133 mmol/L — ABNORMAL LOW (ref 135–145)

## 2023-01-26 MED ORDER — GABAPENTIN 400 MG PO CAPS: 400.0000 mg | ORAL_CAPSULE | Freq: Two times a day (BID) | ORAL | 0 refills | Status: DC

## 2023-01-26 MED ORDER — DICLOFENAC SODIUM 1 % EX GEL: 4.0000 g | Freq: Four times a day (QID) | CUTANEOUS | 0 refills | Status: DC

## 2023-01-26 MED ORDER — TORSEMIDE 20 MG PO TABS: 40.0000 mg | ORAL_TABLET | Freq: Every day | ORAL | Status: DC

## 2023-01-26 MED ORDER — ACETAMINOPHEN 500 MG PO TABS: 500.0000 mg | ORAL_TABLET | Freq: Two times a day (BID) | ORAL | 0 refills | Status: DC | PRN

## 2023-01-26 MED ORDER — TORSEMIDE 10 MG PO TABS: 30.0000 mg | ORAL_TABLET | Freq: Every day | ORAL | 0 refills | Status: DC

## 2023-01-26 MED ORDER — POTASSIUM CHLORIDE CRYS ER 10 MEQ PO TBCR
40.0000 meq | EXTENDED_RELEASE_TABLET | Freq: Once | ORAL | Status: AC
  Administered 2023-01-26: 40 meq via ORAL
  Filled 2023-01-26: qty 4

## 2023-01-26 MED ORDER — POTASSIUM CHLORIDE 20 MEQ PO PACK
40.0000 meq | PACK | Freq: Once | ORAL | Status: DC
  Filled 2023-01-26: qty 2

## 2023-01-26 MED ORDER — SPIRONOLACTONE 50 MG PO TABS: 50.0000 mg | ORAL_TABLET | Freq: Every day | ORAL | 0 refills | Status: DC

## 2023-01-26 MED ORDER — ALBUTEROL SULFATE HFA 108 (90 BASE) MCG/ACT IN AERS: 2.0000 | INHALATION_SPRAY | Freq: Four times a day (QID) | RESPIRATORY_TRACT | 0 refills | Status: DC | PRN

## 2023-01-26 MED ORDER — HEPARIN SOD (PORK) LOCK FLUSH 100 UNIT/ML IV SOLN
500.0000 [IU] | INTRAVENOUS | Status: AC | PRN
  Administered 2023-01-26: 500 [IU]

## 2023-01-26 MED ORDER — ATORVASTATIN CALCIUM 10 MG PO TABS: 10.0000 mg | ORAL_TABLET | Freq: Every day | ORAL | 2 refills | Status: DC

## 2023-01-26 MED ORDER — BUTALBITAL-APAP-CAFFEINE 50-325-40 MG PO TABS: 1.0000 | ORAL_TABLET | Freq: Four times a day (QID) | ORAL | 0 refills | Status: DC | PRN

## 2023-01-26 MED ORDER — SERTRALINE HCL 50 MG PO TABS: 50.0000 mg | ORAL_TABLET | Freq: Every day | ORAL | 0 refills | Status: DC

## 2023-01-26 MED ORDER — LACTULOSE 10 GM/15ML PO SOLN: 20.0000 g | Freq: Three times a day (TID) | ORAL | 0 refills | Status: DC

## 2023-01-26 MED ORDER — TORSEMIDE 40 MG PO TABS: 40.0000 mg | ORAL_TABLET | Freq: Every day | ORAL | 0 refills | Status: DC

## 2023-01-26 MED ORDER — VITAMIN D (ERGOCALCIFEROL) 1.25 MG (50000 UNIT) PO CAPS: 50000.0000 [IU] | ORAL_CAPSULE | ORAL | 0 refills | Status: DC

## 2023-01-26 NOTE — Progress Notes (Signed)
PROGRESS NOTE   Subjective/Complaints: Mrs. Kolakowski feels ready for d/c today. Discussed drop in Hgb and giving 1U PRBC before d/c Discussed low K+  Objective:  ROS: Denies fevers, chills, N/V, abdominal pain, constipation, diarrhea, SOB, cough, chest pain, new weakness or paraesthesias.     No results found. Recent Labs    01/26/23 0506  WBC 6.3  HGB 7.5*  HCT 23.7*  PLT 232   Recent Labs    01/26/23 0506  NA 133*  K 3.3*  CL 100  CO2 25  GLUCOSE 136*  BUN 34*  CREATININE 1.40*  CALCIUM 7.6*     Intake/Output Summary (Last 24 hours) at 01/26/2023 1004 Last data filed at 01/26/2023 0758 Gross per 24 hour  Intake 356 ml  Output --  Net 356 ml        Physical Exam: Vital Signs Blood pressure (!) 141/45, pulse 67, temperature 98 F (36.7 C), temperature source Oral, resp. rate 18, weight 101.7 kg, SpO2 98 %. Gen: no distress, normal appearing.  Laying in bed, well-appearing. +obese, BMI 33.11 HEENT: oral mucosa pink and moist, NCAT Cardio: Reg rate Chest: normal effort, normal rate of breathing Abd: soft, non-distended Ext: no edema Psych: pleasant, normal affect Skin:    General: Skin is warm and dry.     Comments: Dry flaky skin on BLE with tenting and stasis changes.  -Ongoing Neurological:     Mental Status: She is alert.     Comments: Slow to process with delayed output and needs redirection at times.  -Appropriate on today's exam, mild processing delay but no redirection needed.  Appropriate with all responses. Neurologic: oriented x 3  motor strength is 5/5 throughout sensory exam normal sensation to light touch and proprioception in bilateral upper and lower extremities No UE tremor  Musculoskeletal: Full range of motion in all 4 extremities. No joint swelling   Assessment/Plan: 1. Functional deficits which require 3+ hours per day of interdisciplinary therapy in a comprehensive inpatient  rehab setting. Physiatrist is providing close team supervision and 24 hour management of active medical problems listed below. Physiatrist and rehab team continue to assess barriers to discharge/monitor patient progress toward functional and medical goals  Care Tool:  Bathing    Body parts bathed by patient: Right arm, Left arm, Chest, Abdomen, Right upper leg, Left upper leg, Face, Front perineal area, Buttocks, Right lower leg, Left lower leg   Body parts bathed by helper: Right lower leg, Left lower leg, Front perineal area, Buttocks     Bathing assist Assist Level: Supervision/Verbal cueing     Upper Body Dressing/Undressing Upper body dressing   What is the patient wearing?: Pull over shirt    Upper body assist Assist Level: Set up assist    Lower Body Dressing/Undressing Lower body dressing      What is the patient wearing?: Underwear/pull up, Pants     Lower body assist Assist for lower body dressing: Supervision/Verbal cueing     Toileting Toileting    Toileting assist Assist for toileting: Independent with assistive device     Transfers Chair/bed transfer  Transfers assist     Chair/bed transfer assist level: Independent with  assistive device Chair/bed transfer assistive device: Other (rollator)   Locomotion Ambulation   Ambulation assist      Assist level: Independent with assistive device Assistive device: Rollator Max distance: 150=   Walk 10 feet activity   Assist     Assist level: Independent with assistive device Assistive device: Rollator   Walk 50 feet activity   Assist Walk 50 feet with 2 turns activity did not occur: Safety/medical concerns  Assist level: Independent with assistive device Assistive device: Rollator    Walk 150 feet activity   Assist Walk 150 feet activity did not occur: Safety/medical concerns  Assist level: Independent with assistive device Assistive device: Rollator    Walk 10 feet on uneven  surface  activity   Assist     Assist level: Independent with assistive device Assistive device: Rollator   Wheelchair     Assist Is the patient using a wheelchair?: No Type of Wheelchair: Manual Wheelchair activity did not occur: N/A  Wheelchair assist level: Dependent - Patient 0%      Wheelchair 50 feet with 2 turns activity    Assist    Wheelchair 50 feet with 2 turns activity did not occur: N/A       Wheelchair 150 feet activity     Assist  Wheelchair 150 feet activity did not occur: N/A   Assist Level: Dependent - Patient 0%   Blood pressure (!) 141/45, pulse 67, temperature 98 F (36.7 C), temperature source Oral, resp. rate 18, weight 101.7 kg, SpO2 98 %.  Medical Problem List and Plan: 1. Functional deficits secondary to acute hepatic encephalopathy s/p  splenic emobolization with recurrent anemia             -patient may  shower             -ELOS/Goals: 7 d  Therapy notes reviewed  Discussed moving d/c date to Monday so she can attend outpatient paracentesis on Tuesday  2. Hypotension: continues teds as needed  3. Pain: Voltaren gel, continue Neurontin 400 mg twice daily  -Patient interested in as needed prescription for Fioricet on discharge; defer to primary team  4. Depression: continue Zoloft 50 mg daily             -antipsychotic agents: N/A             --denies insomnia.  5. Neuropsych/cognition: This patient may be intermittently capable of making decisions on her own behalf.             --reports cognition has improved in the past week.  6. Skin/Wound Care: Left foot ulceration followed by podiatry services.  Wound care as directed.  Routine skin checks 7. Fluids/Electrolytes/Nutrition: Routine in and outs with follow-up chemistries 8.  Acute on chronic anemia/pancytopenia: Hx of thalassemia intermedia, hems, s/p splenic embolization.    --S/p  multiple admission w/multiple transfusions --Iron infusion held due to concerns of  overload due to  multiple transfusion.  Hgb reviewed and decreased, will give 1U PRBCs prior to d/c  9.  CKD stage III.  Baseline SCr-  10.  NASH w/cirrhosis, Hems, ascites: Lactulose TID--has refused daily since 01/12/23.  -Continue low salt restrictions and daily weights.   --Ammonia reviewed and is downtrending --Continue torsemide and aldactone daily to help manage ascites.  -moved discharge up to Monday so she can attend outpatient paracentesis on Tuesday at 10:30am  11.  Hyperlipidemia.  Continue Lipitor  12.  Chronic orthostasis. Midodrine was discontinued on 05/28 as was being  held.  --Will order orthostatic BP. Add TED hose   13. Hyponatremia/Hypokalemia: supplement klor on 6/4, d/c scheduled supplement   14. Hyperammonia: discussed that level increased today and that lactulose will help with this  -6/9: Patient endorsing bowel movements 3-4 times daily.  Did refuse lactulose last night, discussed compliance with this is part of her medication regimen. 6/10: discussed that ammonia has increased slightly, discussed that it is important to continue lactulose to keep this down 15. Suboptimal vitamin D with history of insufficiency: continue ergocalciferol 50,000U once per week for 7 weeks  16. Hypoalbuminemia: encouraged consuming protein in her diet  17. Hypotension: Resolved -Weights approximately stable, continue to monitor -Orthostats positive this a.m., patient endorses she is at her normal blood pressure and asymptomatic; continue to encourage fluids and monitor 6/10: weight increased today, increase torsemide back to 40mg  Filed Weights   01/23/23 0352 01/24/23 0500 01/26/23 0623  Weight: 98.2 kg 98.8 kg 101.7 kg    >30 minutes spent in discharge of patient including review of medications and follow-up appointments, physical examination, and in answering all patient's questions    LOS: 7 days A FACE TO FACE EVALUATION WAS PERFORMED  Chere Babson P  Cosby Proby 01/26/2023, 10:04 AM

## 2023-01-26 NOTE — Progress Notes (Signed)
Inpatient Rehabilitation Discharge Medication Review by a Pharmacist  A complete drug regimen review was completed for this patient to identify any potential clinically significant medication issues.  High Risk Drug Classes Is patient taking? Indication by Medication  Antipsychotic No   Anticoagulant No   Antibiotic No   Opioid No   Antiplatelet No   Hypoglycemics/insulin No   Vasoactive Medication Yes Torsemide, spironolactone - fluid  Chemotherapy No   Other Yes Albuterol - prn SOB Atorvastatin - HLD Gabapentin - neuropathy Lactulose - cirrhosis  Sertraline - mood Fioricet - prn headache     Type of Medication Issue Identified Description of Issue Recommendation(s)  Drug Interaction(s) (clinically significant)     Duplicate Therapy     Allergy     No Medication Administration End Date     Incorrect Dose     Additional Drug Therapy Needed     Significant med changes from prior encounter (inform family/care partners about these prior to discharge).    Other       Clinically significant medication issues were identified that warrant physician communication and completion of prescribed/recommended actions by midnight of the next day:  No  Pharmacist comments: None  Time spent performing this drug regimen review (minutes):  20 minutes  Thank you Okey Regal, PharmD

## 2023-01-26 NOTE — Progress Notes (Signed)
Discharge instructions provided by Deatra Ina, PA. Staff assisted patient off the unit; patient left via private car with Husband.    Tilden Dome, LPN

## 2023-01-27 ENCOUNTER — Other Ambulatory Visit: Payer: Self-pay | Admitting: Interventional Radiology

## 2023-01-27 ENCOUNTER — Inpatient Hospital Stay: Payer: Medicare Other

## 2023-01-27 ENCOUNTER — Ambulatory Visit
Admission: RE | Admit: 2023-01-27 | Discharge: 2023-01-27 | Disposition: A | Discharge status: Home / Self Care | Payer: Medicare Other | Source: Ambulatory Visit | Attending: Gastroenterology | Admitting: Gastroenterology

## 2023-01-27 DIAGNOSIS — K7581 Nonalcoholic steatohepatitis (NASH): Secondary | ICD-10-CM | POA: Diagnosis not present

## 2023-01-27 DIAGNOSIS — D7389 Other diseases of spleen: Secondary | ICD-10-CM

## 2023-01-27 DIAGNOSIS — R188 Other ascites: Secondary | ICD-10-CM | POA: Diagnosis not present

## 2023-01-27 DIAGNOSIS — K7460 Unspecified cirrhosis of liver: Secondary | ICD-10-CM

## 2023-01-27 MED ORDER — HEPARIN SOD (PORK) LOCK FLUSH 100 UNIT/ML IV SOLN
INTRAVENOUS | Status: AC
  Filled 2023-01-27: qty 5

## 2023-01-27 MED ORDER — ALBUMIN HUMAN 25 % IV SOLN
INTRAVENOUS | Status: AC
  Filled 2023-01-27: qty 100

## 2023-01-27 MED ORDER — HEPARIN SOD (PORK) LOCK FLUSH 100 UNIT/ML IV SOLN
500.0000 [IU] | Freq: Once | INTRAVENOUS | Status: AC
  Administered 2023-01-27: 500 [IU] via INTRAVENOUS

## 2023-01-27 MED ORDER — LIDOCAINE HCL (PF) 1 % IJ SOLN
10.0000 mL | Freq: Once | INTRAMUSCULAR | Status: AC
  Administered 2023-01-27: 10 mL via INTRADERMAL
  Filled 2023-01-27: qty 10

## 2023-01-27 MED ORDER — ALBUMIN HUMAN 25 % IV SOLN
25.0000 g | Freq: Once | INTRAVENOUS | Status: AC
  Administered 2023-01-27: 25 g via INTRAVENOUS

## 2023-01-27 NOTE — Procedures (Signed)
Interventional Radiology Procedure Note  Procedure: Image guided paracentesis  Complications: None  EBL: None    Recommendations: - Routine wound care    Signed,  Yvone Neu. Loreta Ave, DO

## 2023-01-28 ENCOUNTER — Inpatient Hospital Stay: Payer: Medicare Other

## 2023-02-02 ENCOUNTER — Inpatient Hospital Stay: Payer: Medicare Other | Attending: Hematology

## 2023-02-02 VITALS — BP 107/52 | HR 65 | Temp 97.1°F | Resp 16

## 2023-02-02 DIAGNOSIS — D500 Iron deficiency anemia secondary to blood loss (chronic): Secondary | ICD-10-CM | POA: Insufficient documentation

## 2023-02-02 DIAGNOSIS — D563 Thalassemia minor: Secondary | ICD-10-CM | POA: Insufficient documentation

## 2023-02-02 DIAGNOSIS — Z95828 Presence of other vascular implants and grafts: Secondary | ICD-10-CM

## 2023-02-02 LAB — SAMPLE TO BLOOD BANK

## 2023-02-02 LAB — CBC
HCT: 23.6 % — ABNORMAL LOW (ref 36.0–46.0)
Hemoglobin: 7.3 g/dL — ABNORMAL LOW (ref 12.0–15.0)
MCH: 22.5 pg — ABNORMAL LOW (ref 26.0–34.0)
MCHC: 30.9 g/dL (ref 30.0–36.0)
MCV: 72.8 fL — ABNORMAL LOW (ref 80.0–100.0)
Platelets: 221 10*3/uL (ref 150–400)
RBC: 3.24 MIL/uL — ABNORMAL LOW (ref 3.87–5.11)
RDW: 23.1 % — ABNORMAL HIGH (ref 11.5–15.5)
WBC: 5.4 10*3/uL (ref 4.0–10.5)
nRBC: 0 % (ref 0.0–0.2)

## 2023-02-02 MED ORDER — SODIUM CHLORIDE 0.9% FLUSH
10.0000 mL | INTRAVENOUS | Status: DC | PRN
  Administered 2023-02-02: 10 mL via INTRAVENOUS

## 2023-02-02 MED ORDER — HEPARIN SOD (PORK) LOCK FLUSH 100 UNIT/ML IV SOLN
500.0000 [IU] | Freq: Once | INTRAVENOUS | Status: AC
  Administered 2023-02-02: 500 [IU] via INTRAVENOUS

## 2023-02-02 NOTE — Progress Notes (Signed)
13:49 pm called and informed patient HGB 7.3 and no blood products needed per R. Pennington PA standing orders. Patient verbalized understanding.

## 2023-02-02 NOTE — Progress Notes (Signed)
Patients port flushed without difficulty.  Good blood return noted with no bruising or swelling noted at site.  Band aid applied.  VSS with discharge and left in satisfactory condition with no s/s of distress noted.   

## 2023-02-03 ENCOUNTER — Other Ambulatory Visit: Payer: Self-pay | Admitting: Gastroenterology

## 2023-02-03 ENCOUNTER — Ambulatory Visit
Admission: RE | Admit: 2023-02-03 | Discharge: 2023-02-03 | Disposition: A | Discharge status: Home / Self Care | Payer: Medicare Other | Source: Ambulatory Visit | Attending: Gastroenterology | Admitting: Gastroenterology

## 2023-02-03 ENCOUNTER — Inpatient Hospital Stay: Payer: Medicare Other

## 2023-02-03 DIAGNOSIS — R188 Other ascites: Secondary | ICD-10-CM | POA: Diagnosis not present

## 2023-02-03 DIAGNOSIS — K7460 Unspecified cirrhosis of liver: Secondary | ICD-10-CM | POA: Insufficient documentation

## 2023-02-03 DIAGNOSIS — K7581 Nonalcoholic steatohepatitis (NASH): Secondary | ICD-10-CM | POA: Insufficient documentation

## 2023-02-03 NOTE — Progress Notes (Signed)
Pt presented to [place] for paracentesis. Upon US examination, there was scattered ascites throughout abdomen with a small collection in right upper quadrant not large enough to safely access.  Exam was ended and explained to patient.  Patient was in agreement with findings.      Alex Gardener, AGNP-BC 02/03/2023, 11:07 AM

## 2023-02-04 ENCOUNTER — Inpatient Hospital Stay: Payer: Medicare Other

## 2023-02-09 ENCOUNTER — Inpatient Hospital Stay: Payer: Medicare Other

## 2023-02-09 ENCOUNTER — Telehealth: Payer: Self-pay

## 2023-02-09 VITALS — BP 117/54 | HR 73 | Temp 98.5°F | Resp 20

## 2023-02-09 DIAGNOSIS — Z95828 Presence of other vascular implants and grafts: Secondary | ICD-10-CM

## 2023-02-09 DIAGNOSIS — D500 Iron deficiency anemia secondary to blood loss (chronic): Secondary | ICD-10-CM

## 2023-02-09 DIAGNOSIS — D563 Thalassemia minor: Secondary | ICD-10-CM | POA: Diagnosis not present

## 2023-02-09 LAB — CBC
HCT: 22.7 % — ABNORMAL LOW (ref 36.0–46.0)
Hemoglobin: 7.1 g/dL — ABNORMAL LOW (ref 12.0–15.0)
MCH: 22.3 pg — ABNORMAL LOW (ref 26.0–34.0)
MCHC: 31.3 g/dL (ref 30.0–36.0)
MCV: 71.2 fL — ABNORMAL LOW (ref 80.0–100.0)
Platelets: 205 10*3/uL (ref 150–400)
RBC: 3.19 MIL/uL — ABNORMAL LOW (ref 3.87–5.11)
RDW: 22.1 % — ABNORMAL HIGH (ref 11.5–15.5)
WBC: 5 10*3/uL (ref 4.0–10.5)
nRBC: 0 % (ref 0.0–0.2)

## 2023-02-09 LAB — SAMPLE TO BLOOD BANK

## 2023-02-09 MED ORDER — SODIUM CHLORIDE 0.9% FLUSH
10.0000 mL | Freq: Once | INTRAVENOUS | Status: AC
  Administered 2023-02-09: 10 mL via INTRAVENOUS

## 2023-02-09 MED ORDER — HEPARIN SOD (PORK) LOCK FLUSH 100 UNIT/ML IV SOLN
500.0000 [IU] | Freq: Once | INTRAVENOUS | Status: AC
  Administered 2023-02-09: 500 [IU] via INTRAVENOUS

## 2023-02-09 NOTE — Patient Instructions (Signed)
[facility]  Discharge Instructions: Thank you for choosing [facility] to provide your oncology and hematology care.  If you have a lab appointment with the Cancer Center - please note that after April 8th, 2024, all labs will be drawn in the cancer center.  You do not have to check in or register with the main entrance as you have in the past but will complete your check-in in the cancer center.  Wear comfortable clothing and clothing appropriate for easy access to any Portacath or PICC line.   We strive to give you quality time with your provider. You may need to reschedule your appointment if you arrive late (15 or more minutes).  Arriving late affects you and other patients whose appointments are after yours.  Also, if you miss three or more appointments without notifying the office, you may be dismissed from the clinic at the provider's discretion.      For prescription refill requests, have your pharmacy contact our office and allow 72 hours for refills to be completed.    Today you received the following port flush lab, return as scheduled.   To help prevent nausea and vomiting after your treatment, we encourage you to take your nausea medication as directed.  BELOW ARE SYMPTOMS THAT SHOULD BE REPORTED IMMEDIATELY: *FEVER GREATER THAN 100.4 F (38 C) OR HIGHER *CHILLS OR SWEATING *NAUSEA AND VOMITING THAT IS NOT CONTROLLED WITH YOUR NAUSEA MEDICATION *UNUSUAL SHORTNESS OF BREATH *UNUSUAL BRUISING OR BLEEDING *URINARY PROBLEMS (pain or burning when urinating, or frequent urination) *BOWEL PROBLEMS (unusual diarrhea, constipation, pain near the anus) TENDERNESS IN MOUTH AND THROAT WITH OR WITHOUT PRESENCE OF ULCERS (sore throat, sores in mouth, or a toothache) UNUSUAL RASH, SWELLING OR PAIN  UNUSUAL VAGINAL DISCHARGE OR ITCHING   Items with * indicate a potential emergency and should be followed up as soon as possible or go to the Emergency  Department if any problems should occur.  Please show the CHEMOTHERAPY ALERT CARD or IMMUNOTHERAPY ALERT CARD at check-in to the Emergency Department and triage nurse.  Should you have questions after your visit or need to cancel or reschedule your appointment, please contact [facility] [phone]  and follow the prompts.  Office hours are 8:00 a.m. to 4:30 p.m. Monday - Friday. Please note that voicemails left after 4:00 p.m. may not be returned until the following business day.  We are closed weekends and major holidays. You have access to a nurse at all times for urgent questions. Please call the main number to the clinic [phone] and follow the prompts.  For any non-urgent questions, you may also contact your provider using MyChart. We now offer e-Visits for anyone 18 and older to request care online for non-urgent symptoms. For details visit [email or web address].   Also download the MyChart app! Go to the app store, search "MyChart", open the app, select [facility], and log in with your MyChart username and password.   

## 2023-02-09 NOTE — Telephone Encounter (Signed)
Called patient and left message to tell her she did not need a blood transfusion this week due to her hemoglobin is 7.1 per protocol .

## 2023-02-09 NOTE — Progress Notes (Signed)
Port flushed with good blood return noted. No bruising or swelling at site. Bandaid applied and patient discharged in satisfactory condition. VVS stable with no signs or symptoms of distressed noted. 

## 2023-02-10 ENCOUNTER — Inpatient Hospital Stay: Payer: Medicare Other

## 2023-02-10 ENCOUNTER — Other Ambulatory Visit: Payer: Self-pay | Admitting: Gastroenterology

## 2023-02-10 ENCOUNTER — Ambulatory Visit
Admission: RE | Admit: 2023-02-10 | Discharge: 2023-02-10 | Disposition: A | Discharge status: Home / Self Care | Payer: Medicare Other | Source: Ambulatory Visit | Attending: Gastroenterology | Admitting: Gastroenterology

## 2023-02-10 DIAGNOSIS — K7460 Unspecified cirrhosis of liver: Secondary | ICD-10-CM

## 2023-02-10 DIAGNOSIS — K7581 Nonalcoholic steatohepatitis (NASH): Secondary | ICD-10-CM | POA: Insufficient documentation

## 2023-02-10 DIAGNOSIS — R188 Other ascites: Secondary | ICD-10-CM | POA: Diagnosis not present

## 2023-02-10 NOTE — Progress Notes (Signed)
Patient presented to [facility] [place] department for paracentesis. Limited ultrasound examination of the abdomen revealed only a few small pockets in the right abdomen. Procedure ordered for therapeutic purposes and the patient stated she did not feel like she needed a paracentesis. Patient requested to not have procedure performed.   No procedure performed and images are saved in Epic. Patient advised to call [facility] [place] if she feels like she needs a paracentesis and wishes to be evaluated.   Alwyn Ren, [place] [phone] 02/10/2023, 11:15 AM

## 2023-02-11 ENCOUNTER — Inpatient Hospital Stay: Payer: Medicare Other

## 2023-02-12 ENCOUNTER — Ambulatory Visit (HOSPITAL_COMMUNITY): Payer: Medicare Other | Attending: Gastroenterology | Admitting: Occupational Therapy

## 2023-02-12 DIAGNOSIS — R29818 Other symptoms and signs involving the nervous system: Secondary | ICD-10-CM | POA: Diagnosis not present

## 2023-02-12 DIAGNOSIS — R278 Other lack of coordination: Secondary | ICD-10-CM | POA: Diagnosis not present

## 2023-02-12 NOTE — Therapy (Signed)
OUTPATIENT OCCUPATIONAL THERAPY NEURO EVALUATION  Patient Name: Rachel Huber MRN: [id number] DOB:15-Jul-1956, 67 y.o., female Today's Date: 02/13/2023  PCP: Einar Crow, MD REFERRING PROVIDER: Cruzita Lederer, PA-C  END OF SESSION:  OT End of Session - 02/12/23 1605     Visit Number 1    Number of Visits 7    Date for OT Re-Evaluation 04/03/23    Authorization Type Medicare Part A and B    OT Start Time 1034    OT Stop Time 1135    OT Time Calculation (min) 61 min    Activity Tolerance Patient tolerated treatment well    Behavior During Therapy WFL for tasks assessed/performed             Past Medical History:  Diagnosis Date   Anemia in chronic kidney disease (CKD) 02/07/2022   Anxiety    Arthritis    Ascites of liver    Weekly paracentesis   Cirrhosis of liver (HCC)    CKD (chronic kidney disease) stage 4, GFR 15-29 ml/min (HCC)    GERD (gastroesophageal reflux disease)    Grade IV internal hemorrhoids    Hypertension    NASH (nonalcoholic steatohepatitis)    Neuropathy, diabetic (HCC)    Obesity (BMI 30-39.9)    OSA on CPAP    Pneumonia    Portal hypertension (HCC)    S/P TIPS (transjugular intrahepatic portosystemic shunt)    Thalassemia minor 1992   Past Surgical History:  Procedure Laterality Date   ABDOMINAL HYSTERECTOMY     BREAST BIOPSY Right 02/15/2018   [place] bx 6-6:30 ribbon shape, ONE CORE FRAGMENT WITH FIBROSIS. ONE CORE FRAGMENT WITH PORTION OF A DILATED   BREAST BIOPSY Right 02/15/2018   [place] bx 9:00 heart shape, USUAL DUCTAL HYPERPLASIA   BREAST LUMPECTOMY Right 03/09/2018   Procedure: BREAST LUMPECTOMY x 2;  Surgeon: Sung Amabile, DO;  Location: [facility];  Service: General;  Laterality: Right;   CATARACT EXTRACTION W/PHACO Left 06/11/2016   Procedure: CATARACT EXTRACTION PHACO AND INTRAOCULAR LENS PLACEMENT (IOC);  Surgeon: Sallee Lange, MD;  Location: [facility];  Service: Ophthalmology;  Laterality: Left;  Lot #  [id number] H US:01:38.6 AP%:26.4 CDE:44.15   CATARACT EXTRACTION W/PHACO Right 06/15/2018   Procedure: CATARACT EXTRACTION PHACO AND INTRAOCULAR LENS PLACEMENT (IOC);  Surgeon: Galen Manila, MD;  Location: [facility];  Service: Ophthalmology;  Laterality: Right;  [place] 00:38.2 CDE 4.23 Fluid Pack Lot # [id number]   COLONOSCOPIES     COLONOSCOPY WITH PROPOFOL N/A 10/10/2020   Procedure: COLONOSCOPY WITH PROPOFOL;  Surgeon: Regis Bill, MD;  Location: [facility];  Service: Endoscopy;  Laterality: N/A;   COLONOSCOPY WITH PROPOFOL N/A 11/20/2020   Procedure: COLONOSCOPY WITH PROPOFOL;  Surgeon: Regis Bill, MD;  Location: [facility];  Service: Endoscopy;  Laterality: N/A;  DM STAT CBC, BMP COVID POSITIVE 09/02/2020   COLONOSCOPY WITH PROPOFOL N/A 06/19/2022   Procedure: COLONOSCOPY WITH PROPOFOL;  Surgeon: Regis Bill, MD;  Location: [facility];  Service: Endoscopy;  Laterality: N/A;   CTR     ESOPHAGOGASTRODUODENOSCOPY (EGD) WITH PROPOFOL N/A 10/10/2020   Procedure: ESOPHAGOGASTRODUODENOSCOPY (EGD) WITH PROPOFOL;  Surgeon: Regis Bill, MD;  Location: [facility];  Service: Endoscopy;  Laterality: N/A;  COVID POSITIVE 10/08/2020   FLEXIBLE SIGMOIDOSCOPY N/A 02/06/2022   Procedure: FLEXIBLE SIGMOIDOSCOPY;  Surgeon: Regis Bill, MD;  Location: [facility];  Service: Endoscopy;  Laterality: N/A;  Patient requests anesthesia   FLEXIBLE SIGMOIDOSCOPY N/A 12/19/2022   Procedure: FLEXIBLE SIGMOIDOSCOPY;  Surgeon:  Dolores Frame, MD;  Location: [facility];  Service: Gastroenterology;  Laterality: N/A;   IR ANGIOGRAM SELECTIVE EACH ADDITIONAL VESSEL  02/07/2022   IR ANGIOGRAM SELECTIVE EACH ADDITIONAL VESSEL  02/07/2022   IR ANGIOGRAM SELECTIVE EACH ADDITIONAL VESSEL  09/05/2022   IR ANGIOGRAM VISCERAL SELECTIVE  02/07/2022   IR ANGIOGRAM VISCERAL SELECTIVE  09/05/2022   IR EMBO ART  VEN HEMORR LYMPH EXTRAV  INC GUIDE ROADMAPPING  09/05/2022   IR  EMBO ART  VEN HEMORR LYMPH EXTRAV  INC GUIDE ROADMAPPING  11/05/2022   IR EMBO ARTERIAL NOT HEMORR HEMANG INC GUIDE ROADMAPPING  02/07/2022   IR EMBO TUMOR ORGAN ISCHEMIA INFARCT INC GUIDE ROADMAPPING  01/06/2023   IR IMAGING GUIDED PORT INSERTION  06/13/2022   IR IMAGING GUIDED PORT INSERTION  11/25/2022   IR PARACENTESIS  12/17/2021   IR PARACENTESIS  03/25/2022   IR PARACENTESIS  11/25/2022   IR PARACENTESIS  01/06/2023   IR PARACENTESIS  01/19/2023   IR RADIOLOGIST EVAL & MGMT  03/18/2022   IR RADIOLOGIST EVAL & MGMT  05/07/2022   IR RADIOLOGIST EVAL & MGMT  05/16/2022   IR RADIOLOGIST EVAL & MGMT  10/13/2022   IR RADIOLOGIST EVAL & MGMT  12/12/2022   IR US GUIDE VASC ACCESS RIGHT  02/07/2022   IR US GUIDE VASC ACCESS RIGHT  09/05/2022   JOINT REPLACEMENT     KNEE SURGERY Right 09/24/2017   plates and pins   PORT-A-CATH REMOVAL N/A 09/23/2022   Procedure: MINOR REMOVAL PORT-A-CATH;  Surgeon: Franky Macho, MD;  Location: [facility];  Service: General;  Laterality: N/A;   TEE WITHOUT CARDIOVERSION N/A 09/23/2022   Procedure: TRANSESOPHAGEAL ECHOCARDIOGRAM (TEE);  Surgeon: Antoine Poche, MD;  Location: [facility];  Service: Endoscopy;  Laterality: N/A;   TOTAL SHOULDER ARTHROPLASTY Right 09/27/2015   Patient Active Problem List   Diagnosis Date Noted   Acute hepatic encephalopathy (HCC) 01/19/2023   Pancytopenia (HCC) 01/06/2023   Anemia 01/06/2023   Internal hemorrhoid 01/06/2023   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   Symptomatic anemia 12/17/2022   Hypokalemia 11/06/2022   Acute on chronic anemia 11/03/2022   Hypotension 11/03/2022   AKI (acute kidney injury) (HCC) 10/15/2022   Rectal bleeding 06/17/2022   Obesity (BMI 30-39.9) 06/17/2022   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 mellitus (HCC) 06/17/2022   Grade IV internal hemorrhoids 05/16/2022   Thrombocytopenia (HCC) 03/05/2022   Anemia in chronic kidney disease (CKD) 02/07/2022   GI bleeding 02/04/2022   Chronic kidney disease, stage 3b (HCC)  02/04/2022   Depression with anxiety 02/04/2022   Liver cirrhosis secondary to NASH (HCC)    Insomnia 01/27/2022   Iron deficiency anemia due to chronic blood loss 02/13/2021   History of uterine cancer 02/13/2021   Decompensated hepatic cirrhosis (HCC) 10/25/2020   OSA on CPAP 10/25/2020   Acute respiratory failure with hypoxia (HCC) 09/10/2020   Closed right hip fracture (HCC) 09/02/2020   Type 2 diabetes mellitus with hyperlipidemia (HCC) 09/02/2020   Thalassemia minor    HLD (hyperlipidemia)    Acute kidney injury superimposed on CKD (HCC)    Depression     ONSET DATE: 01/06/23  REFERRING DIAG: Hepatic Encephalopathy  THERAPY DIAG:  Other lack of coordination  Other symptoms and signs involving the nervous system  Rationale for Evaluation and Treatment: Rehabilitation  SUBJECTIVE:   SUBJECTIVE STATEMENT: "I just feel so weak" Pt accompanied by: self  PERTINENT HISTORY: PMH significant for diabetes mellitus hypertension [name] status  post TIPS revision at [facility] weekly paracentesis recurrent grade 4 internal hemorrhoids status post embolization of superior rectal artery 11/05/2022, multifactorial anemia, OSA with CPAP, CKD stage III, thrombocytopenia/leukopenia due to splenic sequestration/cirrhosis, beta thalassemia intermedia per hematology/oncology, hyperlipidemia, chronic orthostasis with ProAmatine.   PRECAUTIONS: None  WEIGHT BEARING RESTRICTIONS: No  PAIN:  Are you having pain? Yes: NPRS scale: 4/10 Pain location: anterior shoulder girdle Pain description: aching Aggravating factors: movement Relieving factors: Not moving it  FALLS: Has patient fallen in last 6 months? No  LIVING ENVIRONMENT: Lives with: lives with their spouse Lives in: House/apartment Has following equipment at home: Counselling psychologist, Environmental consultant - 4 wheeled, Tour manager, and Grab bars  PLOF: Independent with household mobility with device and Requires assistive device for  independence  PATIENT GOALS: To strengthen my arms  OBJECTIVE:   HAND DOMINANCE: Right  ADLs: Overall ADLs: Due to weakness and fatigue pt requires assist with all IADL's and compensatory strategies for BADL's  MOBILITY STATUS:  Independent with rollator  POSTURE COMMENTS:  No Significant postural limitations Sitting balance:  Independent  ACTIVITY TOLERANCE: Activity tolerance: Decreased endurance in standing to complete tasks, gives out quickly  FUNCTIONAL OUTCOME MEASURES: Upper Extremity Functional Scale (UEFS): 60.0%  UPPER EXTREMITY ROM:    Pt has full ROM  UPPER EXTREMITY MMT:   RUE weaker than left  MMT Right eval Left eval  Shoulder flexion 3+/5 4-/5  Shoulder abduction 3+/5 4-/5  Shoulder adduction 4-/5 4/5  Shoulder extension 4/5 4/5  Shoulder internal rotation 4-/5 4/5  Shoulder external rotation 4-/5 4/5  Elbow flexion 4/5 4+/5  Elbow extension 4-/5 4/5  Wrist flexion 4/5 4/5  Wrist extension 3+/5 4/5  Wrist ulnar deviation 4-/5 4/5  Wrist radial deviation 3+/5 4/5  Wrist pronation 3+/5 4/5  Wrist supination 3+/5 4/5  (Blank rows = not tested)  HAND FUNCTION: Grip strength: Right: -- lbs; Left: -- lbs, Lateral pinch: Right: -- lbs, Left: -- lbs, and 3 point pinch: Right: -- lbs, Left: -- lbs  COORDINATION: 9 Hole Peg test: Right: -- sec; Left: -- sec  SENSATION: WFL  EDEMA: No swelling noted, pt reports occasionally when she is retaining fluid.   OBSERVATIONS: RUE increased stiffness and pain since reverse total shoulder   TODAY'S TREATMENT:                                                                                                                              DATE: 02/12/23: Evaluation Only   PATIENT EDUCATION: Education details: Will Start on First Treatment Session Person educated: Patient Education method: Explanation Education comprehension: verbalized understanding  HOME EXERCISE PROGRAM: Will Start on First Treatment  Session   GOALS: Goals reviewed with patient? Yes  SHORT TERM GOALS: Target date: 04/03/23  Pt will be provided and educated on HEP for BUE mobility and strength for ADL completion.  Goal status: INITIAL  2.  Pt will decrease pain in  BUE to 3/10 or less in order to sleep for 3+ consecutive hours without waking due to pain.   Goal status: INITIAL  3.  Pt will increased BUE strength to 4+/5 in order to lift items to complete cooking and cleaning tasks.   Goal status: INITIAL  4.  Pt will increase BUE grip strength by 10# and pinch strength by 3#.  Goal status: INITIAL  5.  Pt will improve BUE coordination to 40" or less in order to manipulate small items during dressing and bathing tasks with no compensatory strategies or AE.   Goal status: INITIAL    ASSESSMENT:  CLINICAL IMPRESSION: Patient is a 67 y.o. female who was seen today for occupational therapy evaluation for hepatic encephalopathy with BUE weakness and decreased coordination/motor planning.   PERFORMANCE DEFICITS: in functional skills including ADLs, IADLs, coordination, dexterity, ROM, strength, pain, fascial restrictions, muscle spasms, Fine motor control, Gross motor control, body mechanics, and UE functional use.  IMPAIRMENTS: are limiting patient from ADLs, IADLs, rest and sleep, leisure, and social participation.   CO-MORBIDITIES: may have co-morbidities  that affects occupational performance. Patient will benefit from skilled OT to address above impairments and improve overall function.  MODIFICATION OR ASSISTANCE TO COMPLETE EVALUATION: No modification of tasks or assist necessary to complete an evaluation.  OT OCCUPATIONAL PROFILE AND HISTORY: Problem focused assessment: Including review of records relating to presenting problem.  CLINICAL DECISION MAKING: LOW - limited treatment options, no task modification necessary  REHAB POTENTIAL: Good  EVALUATION COMPLEXITY: Low    PLAN:  OT FREQUENCY:  1x/week  OT DURATION: 6 weeks  PLANNED INTERVENTIONS: self care/ADL training, therapeutic exercise, therapeutic activity, manual therapy, passive range of motion, functional mobility training, electrical stimulation, ultrasound, paraffin, moist heat, patient/family education, coping strategies training, and DME and/or AE instructions  RECOMMENDED OTHER SERVICES: PT  CONSULTED AND AGREED WITH PLAN OF CARE: Patient  PLAN FOR NEXT SESSION: Manual Therapy as needed, A/ROM, coordination tasks, gripping tasks, pinching tasks.   Trish Mage, OTR/L [facility] [id number] Mischele Detter Rosemarie Beath, OT 02/13/2023, 4:07 PM

## 2023-02-16 ENCOUNTER — Inpatient Hospital Stay: Payer: Medicare Other | Attending: Hematology

## 2023-02-16 DIAGNOSIS — Z807 Family history of other malignant neoplasms of lymphoid, hematopoietic and related tissues: Secondary | ICD-10-CM | POA: Insufficient documentation

## 2023-02-16 DIAGNOSIS — D696 Thrombocytopenia, unspecified: Secondary | ICD-10-CM | POA: Diagnosis not present

## 2023-02-16 DIAGNOSIS — E559 Vitamin D deficiency, unspecified: Secondary | ICD-10-CM | POA: Insufficient documentation

## 2023-02-16 DIAGNOSIS — D631 Anemia in chronic kidney disease: Secondary | ICD-10-CM | POA: Diagnosis not present

## 2023-02-16 DIAGNOSIS — E538 Deficiency of other specified B group vitamins: Secondary | ICD-10-CM | POA: Diagnosis not present

## 2023-02-16 DIAGNOSIS — D72810 Lymphocytopenia: Secondary | ICD-10-CM | POA: Insufficient documentation

## 2023-02-16 DIAGNOSIS — K7460 Unspecified cirrhosis of liver: Secondary | ICD-10-CM | POA: Insufficient documentation

## 2023-02-16 DIAGNOSIS — Z8542 Personal history of malignant neoplasm of other parts of uterus: Secondary | ICD-10-CM | POA: Insufficient documentation

## 2023-02-16 DIAGNOSIS — K642 Third degree hemorrhoids: Secondary | ICD-10-CM | POA: Diagnosis not present

## 2023-02-16 DIAGNOSIS — Z90710 Acquired absence of both cervix and uterus: Secondary | ICD-10-CM | POA: Insufficient documentation

## 2023-02-16 DIAGNOSIS — R188 Other ascites: Secondary | ICD-10-CM | POA: Insufficient documentation

## 2023-02-16 DIAGNOSIS — Z803 Family history of malignant neoplasm of breast: Secondary | ICD-10-CM | POA: Insufficient documentation

## 2023-02-16 DIAGNOSIS — D561 Beta thalassemia: Secondary | ICD-10-CM | POA: Diagnosis not present

## 2023-02-16 DIAGNOSIS — N189 Chronic kidney disease, unspecified: Secondary | ICD-10-CM | POA: Insufficient documentation

## 2023-02-16 DIAGNOSIS — K909 Intestinal malabsorption, unspecified: Secondary | ICD-10-CM | POA: Diagnosis not present

## 2023-02-16 DIAGNOSIS — K766 Portal hypertension: Secondary | ICD-10-CM | POA: Diagnosis not present

## 2023-02-16 DIAGNOSIS — D500 Iron deficiency anemia secondary to blood loss (chronic): Secondary | ICD-10-CM | POA: Diagnosis present

## 2023-02-16 DIAGNOSIS — Z90722 Acquired absence of ovaries, bilateral: Secondary | ICD-10-CM | POA: Diagnosis not present

## 2023-02-16 DIAGNOSIS — Z8051 Family history of malignant neoplasm of kidney: Secondary | ICD-10-CM | POA: Diagnosis not present

## 2023-02-16 LAB — IRON AND TIBC
Iron: 120 ug/dL (ref 28–170)
Saturation Ratios: 92 % — ABNORMAL HIGH (ref 10.4–31.8)
TIBC: 131 ug/dL — ABNORMAL LOW (ref 250–450)
UIBC: 11 ug/dL

## 2023-02-16 LAB — CBC WITH DIFFERENTIAL/PLATELET
Abs Immature Granulocytes: 0.01 10*3/uL (ref 0.00–0.07)
Basophils Absolute: 0 10*3/uL (ref 0.0–0.1)
Basophils Relative: 1 %
Eosinophils Absolute: 0.4 10*3/uL (ref 0.0–0.5)
Eosinophils Relative: 9 %
HCT: 20.4 % — ABNORMAL LOW (ref 36.0–46.0)
Hemoglobin: 6.2 g/dL — CL (ref 12.0–15.0)
Immature Granulocytes: 0 %
Lymphocytes Relative: 28 %
Lymphs Abs: 1.3 10*3/uL (ref 0.7–4.0)
MCH: 21.2 pg — ABNORMAL LOW (ref 26.0–34.0)
MCHC: 30.4 g/dL (ref 30.0–36.0)
MCV: 69.6 fL — ABNORMAL LOW (ref 80.0–100.0)
Monocytes Absolute: 0.5 10*3/uL (ref 0.1–1.0)
Monocytes Relative: 12 %
Neutro Abs: 2.3 10*3/uL (ref 1.7–7.7)
Neutrophils Relative %: 50 %
Platelets: 212 10*3/uL (ref 150–400)
RBC: 2.93 MIL/uL — ABNORMAL LOW (ref 3.87–5.11)
RDW: 21.6 % — ABNORMAL HIGH (ref 11.5–15.5)
WBC: 4.5 10*3/uL (ref 4.0–10.5)
nRBC: 0 % (ref 0.0–0.2)

## 2023-02-16 LAB — COMPREHENSIVE METABOLIC PANEL
ALT: 20 U/L (ref 0–44)
AST: 40 U/L (ref 15–41)
Albumin: 2.2 g/dL — ABNORMAL LOW (ref 3.5–5.0)
Alkaline Phosphatase: 137 U/L — ABNORMAL HIGH (ref 38–126)
Anion gap: 8 (ref 5–15)
BUN: 26 mg/dL — ABNORMAL HIGH (ref 8–23)
CO2: 22 mmol/L (ref 22–32)
Calcium: 8.1 mg/dL — ABNORMAL LOW (ref 8.9–10.3)
Chloride: 104 mmol/L (ref 98–111)
Creatinine, Ser: 1.34 mg/dL — ABNORMAL HIGH (ref 0.44–1.00)
GFR, Estimated: 44 mL/min — ABNORMAL LOW (ref >60)
Glucose, Bld: 142 mg/dL — ABNORMAL HIGH (ref 70–99)
Potassium: 3.8 mmol/L (ref 3.5–5.1)
Sodium: 134 mmol/L — ABNORMAL LOW (ref 135–145)
Total Bilirubin: 1.8 mg/dL — ABNORMAL HIGH (ref 0.3–1.2)
Total Protein: 5.1 g/dL — ABNORMAL LOW (ref 6.5–8.1)

## 2023-02-16 LAB — SAMPLE TO BLOOD BANK

## 2023-02-16 LAB — PREPARE RBC (CROSSMATCH)

## 2023-02-16 LAB — FERRITIN: Ferritin: 900 ng/mL — ABNORMAL HIGH (ref 11–307)

## 2023-02-16 LAB — BPAM RBC: Unit Type and Rh: 6200

## 2023-02-16 LAB — TYPE AND SCREEN

## 2023-02-16 MED ORDER — SODIUM CHLORIDE 0.9% FLUSH
10.0000 mL | Freq: Once | INTRAVENOUS | Status: AC
  Administered 2023-02-16: 10 mL via INTRAVENOUS

## 2023-02-16 MED ORDER — HEPARIN SOD (PORK) LOCK FLUSH 100 UNIT/ML IV SOLN
500.0000 [IU] | Freq: Once | INTRAVENOUS | Status: AC
  Administered 2023-02-16: 500 [IU] via INTRAVENOUS

## 2023-02-16 NOTE — Patient Instructions (Signed)
[facility]  Discharge Instructions: Thank you for choosing [facility] to provide your oncology and hematology care.  If you have a lab appointment with the Cancer Center - please note that after April 8th, 2024, all labs will be drawn in the cancer center.  You do not have to check in or register with the main entrance as you have in the past but will complete your check-in in the cancer center.  Wear comfortable clothing and clothing appropriate for easy access to any Portacath or PICC line.   We strive to give you quality time with your provider. You may need to reschedule your appointment if you arrive late (15 or more minutes).  Arriving late affects you and other patients whose appointments are after yours.  Also, if you miss three or more appointments without notifying the office, you may be dismissed from the clinic at the provider's discretion.      For prescription refill requests, have your pharmacy contact our office and allow 72 hours for refills to be completed.  To help prevent nausea and vomiting after your treatment, we encourage you to take your nausea medication as directed.  BELOW ARE SYMPTOMS THAT SHOULD BE REPORTED IMMEDIATELY: *FEVER GREATER THAN 100.4 F (38 C) OR HIGHER *CHILLS OR SWEATING *NAUSEA AND VOMITING THAT IS NOT CONTROLLED WITH YOUR NAUSEA MEDICATION *UNUSUAL SHORTNESS OF BREATH *UNUSUAL BRUISING OR BLEEDING *URINARY PROBLEMS (pain or burning when urinating, or frequent urination) *BOWEL PROBLEMS (unusual diarrhea, constipation, pain near the anus) TENDERNESS IN MOUTH AND THROAT WITH OR WITHOUT PRESENCE OF ULCERS (sore throat, sores in mouth, or a toothache) UNUSUAL RASH, SWELLING OR PAIN  UNUSUAL VAGINAL DISCHARGE OR ITCHING   Items with * indicate a potential emergency and should be followed up as soon as possible or go to the Emergency Department if any problems should occur.  Please show the CHEMOTHERAPY ALERT CARD or  IMMUNOTHERAPY ALERT CARD at check-in to the Emergency Department and triage nurse.  Should you have questions after your visit or need to cancel or reschedule your appointment, please contact [facility] [phone]  and follow the prompts.  Office hours are 8:00 a.m. to 4:30 p.m. Monday - Friday. Please note that voicemails left after 4:00 p.m. may not be returned until the following business day.  We are closed weekends and major holidays. You have access to a nurse at all times for urgent questions. Please call the main number to the clinic [phone] and follow the prompts.  For any non-urgent questions, you may also contact your provider using MyChart. We now offer e-Visits for anyone 18 and older to request care online for non-urgent symptoms. For details visit [email or web address].   Also download the MyChart app! Go to the app store, search "MyChart", open the app, select [facility], and log in with your MyChart username and password.   

## 2023-02-16 NOTE — Addendum Note (Signed)
Addended by: Dicky Doe D on: 02/16/2023 01:08 PM   Modules accepted: Orders

## 2023-02-16 NOTE — Progress Notes (Signed)
Patients port flushed without difficulty.  Good blood return noted with no bruising or swelling noted at site.  Band aid applied.  VSS with discharge and left in satisfactory condition with no s/s of distress noted.   

## 2023-02-16 NOTE — Progress Notes (Signed)
CRITICAL VALUE ALERT Critical value received:  HGB 6.2.  Date of notification:  02-16-2023 Time of notification: 11:48 am  Critical value read back:  Yes.   Nurse who received alert:  B.Saunders Arlington RN.  MD notified time and response:  R. Pennington PA @ 12:52 pm. Standing Orders followed. Patient will receive 1 unit of blood on 02-18-2023 @ 12:15 pm.

## 2023-02-17 ENCOUNTER — Encounter
Payer: Medicare Other | Attending: Physical Medicine and Rehabilitation | Admitting: Physical Medicine and Rehabilitation

## 2023-02-17 ENCOUNTER — Other Ambulatory Visit: Payer: 59

## 2023-02-17 ENCOUNTER — Encounter: Payer: Self-pay | Admitting: Physical Medicine and Rehabilitation

## 2023-02-17 ENCOUNTER — Ambulatory Visit: Payer: 59 | Admitting: Physician Assistant

## 2023-02-17 VITALS — BP 103/60 | HR 71 | Ht 69.0 in

## 2023-02-17 DIAGNOSIS — I959 Hypotension, unspecified: Secondary | ICD-10-CM | POA: Diagnosis present

## 2023-02-17 DIAGNOSIS — G6289 Other specified polyneuropathies: Secondary | ICD-10-CM | POA: Insufficient documentation

## 2023-02-17 DIAGNOSIS — D631 Anemia in chronic kidney disease: Secondary | ICD-10-CM | POA: Diagnosis present

## 2023-02-17 DIAGNOSIS — R600 Localized edema: Secondary | ICD-10-CM | POA: Insufficient documentation

## 2023-02-17 DIAGNOSIS — K7581 Nonalcoholic steatohepatitis (NASH): Secondary | ICD-10-CM | POA: Diagnosis present

## 2023-02-17 DIAGNOSIS — K7460 Unspecified cirrhosis of liver: Secondary | ICD-10-CM | POA: Insufficient documentation

## 2023-02-17 DIAGNOSIS — M48062 Spinal stenosis, lumbar region with neurogenic claudication: Secondary | ICD-10-CM | POA: Diagnosis present

## 2023-02-17 DIAGNOSIS — N1832 Chronic kidney disease, stage 3b: Secondary | ICD-10-CM | POA: Diagnosis present

## 2023-02-17 DIAGNOSIS — R262 Difficulty in walking, not elsewhere classified: Secondary | ICD-10-CM | POA: Diagnosis present

## 2023-02-17 MED ORDER — GABAPENTIN 100 MG PO CAPS: 100.0000 mg | ORAL_CAPSULE | Freq: Two times a day (BID) | ORAL | 3 refills | Status: DC

## 2023-02-17 NOTE — Progress Notes (Signed)
Subjective:    Patient ID: Rachel Huber, female    DOB: 07-Jul-1956, 67 y.o.   MRN: [id number]  HPI Mrs. Hannen is a 67 year old woman who presents for hospital follow-up for debility  1) Liver cirrhosis -there has been no output in the last 2 paracentesis -eating normally  2) Anemia: -has not needed transfusions whereas before she had been needing them weekly.   3) Impaired ambulation: -not walking as well as her legs are beginning to swell again -starting PT and OT  Pain Inventory Average Pain 3 Pain Right Now 1 My pain is intermittent, sharp, and aching  LOCATION OF PAIN  Right Shoulder  BOWEL -Number of stools per week: 7-12 Oral laxative use Yes  Type of laxative Latulose   BLADDER Pads  Bladder incontinence Yes  Frequent urination Yes  Leakage with coughing No  Difficulty starting stream Yes  Incomplete bladder emptying No    Mobility walk with assistance use a cane use a walker ability to climb steps?  yes do you drive?  no use a wheelchair  Function disabled: date disabled 02/2021 I need assistance with the following:  bathing, meal prep, household duties, and shopping Do you have any goals in this area?  yes  Neuro/Psych bladder control problems weakness tingling trouble walking anxiety  Prior Studies Any changes since last visit?  no  Physicians involved in your care Any changes since last visit?  no   Family History  Problem Relation Age of Onset   Breast cancer Mother 24   Lymphoma Mother    Diabetes Father    Kidney cancer Father    Heart disease Father    Diabetes Sister    Breast cancer Sister 64   Social History   Socioeconomic History   Marital status: Married    Spouse name: Not on file   Number of children: 0   Years of education: Not on file   Highest education level: Not on file  Occupational History   Occupation: EMPLOYED  Tobacco Use   Smoking status: Never   Smokeless tobacco: Never  Vaping Use    Vaping Use: Never used  Substance and Sexual Activity   Alcohol use: No   Drug use: Never   Sexual activity: Not Currently  Other Topics Concern   Not on file  Social History Narrative   Not on file   Social Determinants of Health   Financial Resource Strain: Low Risk  (04/04/2022)   Overall Financial Resource Strain (CARDIA)    Difficulty of Paying Living Expenses: Not hard at all  Food Insecurity: Patient Declined (01/06/2023)   Hunger Vital Sign    Worried About Running Out of Food in the Last Year: Patient declined    Ran Out of Food in the Last Year: Patient declined  Transportation Needs: Patient Declined (01/06/2023)   PRAPARE - Administrator, Civil Service (Medical): Patient declined    Lack of Transportation (Non-Medical): Patient declined  Physical Activity: Inactive (04/04/2022)   Exercise Vital Sign    Days of Exercise per Week: 0 days    Minutes of Exercise per Session: 0 min  Stress: No Stress Concern Present (04/04/2022)   [employer] of Occupational Health - Occupational Stress Questionnaire    Feeling of Stress : Only a little  Social Connections: Moderately Isolated (04/04/2022)   Social Connection and Isolation Panel [NHANES]    Frequency of Communication with Friends and Family: More than three times a week  Frequency of Social Gatherings with Friends and Family: Three times a week    Attends Religious Services: Never    Active Member of Clubs or Organizations: No    Attends Banker Meetings: Never    Marital Status: Married   Past Surgical History:  Procedure Laterality Date   ABDOMINAL HYSTERECTOMY     BREAST BIOPSY Right 02/15/2018   [place] bx 6-6:30 ribbon shape, ONE CORE FRAGMENT WITH FIBROSIS. ONE CORE FRAGMENT WITH PORTION OF A DILATED   BREAST BIOPSY Right 02/15/2018   [place] bx 9:00 heart shape, USUAL DUCTAL HYPERPLASIA   BREAST LUMPECTOMY Right 03/09/2018   Procedure: BREAST LUMPECTOMY x 2;  Surgeon: Sung Amabile, DO;   Location: [facility];  Service: General;  Laterality: Right;   CATARACT EXTRACTION W/PHACO Left 06/11/2016   Procedure: CATARACT EXTRACTION PHACO AND INTRAOCULAR LENS PLACEMENT (IOC);  Surgeon: Sallee Lange, MD;  Location: [facility];  Service: Ophthalmology;  Laterality: Left;  Lot # [id number] H US:01:38.6 AP%:26.4 CDE:44.15   CATARACT EXTRACTION W/PHACO Right 06/15/2018   Procedure: CATARACT EXTRACTION PHACO AND INTRAOCULAR LENS PLACEMENT (IOC);  Surgeon: Galen Manila, MD;  Location: [facility];  Service: Ophthalmology;  Laterality: Right;  [place] 00:38.2 CDE 4.23 Fluid Pack Lot # [id number]   COLONOSCOPIES     COLONOSCOPY WITH PROPOFOL N/A 10/10/2020   Procedure: COLONOSCOPY WITH PROPOFOL;  Surgeon: Regis Bill, MD;  Location: [facility];  Service: Endoscopy;  Laterality: N/A;   COLONOSCOPY WITH PROPOFOL N/A 11/20/2020   Procedure: COLONOSCOPY WITH PROPOFOL;  Surgeon: Regis Bill, MD;  Location: [facility];  Service: Endoscopy;  Laterality: N/A;  DM STAT CBC, BMP COVID POSITIVE 09/02/2020   COLONOSCOPY WITH PROPOFOL N/A 06/19/2022   Procedure: COLONOSCOPY WITH PROPOFOL;  Surgeon: Regis Bill, MD;  Location: [facility];  Service: Endoscopy;  Laterality: N/A;   CTR     ESOPHAGOGASTRODUODENOSCOPY (EGD) WITH PROPOFOL N/A 10/10/2020   Procedure: ESOPHAGOGASTRODUODENOSCOPY (EGD) WITH PROPOFOL;  Surgeon: Regis Bill, MD;  Location: [facility];  Service: Endoscopy;  Laterality: N/A;  COVID POSITIVE 10/08/2020   FLEXIBLE SIGMOIDOSCOPY N/A 02/06/2022   Procedure: FLEXIBLE SIGMOIDOSCOPY;  Surgeon: Regis Bill, MD;  Location: [facility];  Service: Endoscopy;  Laterality: N/A;  Patient requests anesthesia   FLEXIBLE SIGMOIDOSCOPY N/A 12/19/2022   Procedure: FLEXIBLE SIGMOIDOSCOPY;  Surgeon: Dolores Frame, MD;  Location: [facility];  Service: Gastroenterology;  Laterality: N/A;   IR ANGIOGRAM SELECTIVE EACH ADDITIONAL VESSEL  02/07/2022   IR  ANGIOGRAM SELECTIVE EACH ADDITIONAL VESSEL  02/07/2022   IR ANGIOGRAM SELECTIVE EACH ADDITIONAL VESSEL  09/05/2022   IR ANGIOGRAM VISCERAL SELECTIVE  02/07/2022   IR ANGIOGRAM VISCERAL SELECTIVE  09/05/2022   IR EMBO ART  VEN HEMORR LYMPH EXTRAV  INC GUIDE ROADMAPPING  09/05/2022   IR EMBO ART  VEN HEMORR LYMPH EXTRAV  INC GUIDE ROADMAPPING  11/05/2022   IR EMBO ARTERIAL NOT HEMORR HEMANG INC GUIDE ROADMAPPING  02/07/2022   IR EMBO TUMOR ORGAN ISCHEMIA INFARCT INC GUIDE ROADMAPPING  01/06/2023   IR IMAGING GUIDED PORT INSERTION  06/13/2022   IR IMAGING GUIDED PORT INSERTION  11/25/2022   IR PARACENTESIS  12/17/2021   IR PARACENTESIS  03/25/2022   IR PARACENTESIS  11/25/2022   IR PARACENTESIS  01/06/2023   IR PARACENTESIS  01/19/2023   IR RADIOLOGIST EVAL & MGMT  03/18/2022   IR RADIOLOGIST EVAL & MGMT  05/07/2022   IR RADIOLOGIST EVAL & MGMT  05/16/2022   IR RADIOLOGIST EVAL & MGMT  10/13/2022   IR RADIOLOGIST EVAL & MGMT  12/12/2022   IR US GUIDE VASC ACCESS RIGHT  02/07/2022   IR US GUIDE VASC ACCESS RIGHT  09/05/2022   JOINT REPLACEMENT     KNEE SURGERY Right 09/24/2017   plates and pins   PORT-A-CATH REMOVAL N/A 09/23/2022   Procedure: MINOR REMOVAL PORT-A-CATH;  Surgeon: Franky Macho, MD;  Location: [facility];  Service: General;  Laterality: N/A;   TEE WITHOUT CARDIOVERSION N/A 09/23/2022   Procedure: TRANSESOPHAGEAL ECHOCARDIOGRAM (TEE);  Surgeon: Antoine Poche, MD;  Location: [facility];  Service: Endoscopy;  Laterality: N/A;   TOTAL SHOULDER ARTHROPLASTY Right 09/27/2015   Past Medical History:  Diagnosis Date   Anemia in chronic kidney disease (CKD) 02/07/2022   Anxiety    Arthritis    Ascites of liver    Weekly paracentesis   Cirrhosis of liver (HCC)    CKD (chronic kidney disease) stage 4, GFR 15-29 ml/min (HCC)    GERD (gastroesophageal reflux disease)    Grade IV internal hemorrhoids    Hypertension    NASH (nonalcoholic steatohepatitis)    Neuropathy, diabetic (HCC)    Obesity (BMI  30-39.9)    OSA on CPAP    Pneumonia    Portal hypertension (HCC)    S/P TIPS (transjugular intrahepatic portosystemic shunt)    Thalassemia minor 1992   There were no vitals taken for this visit.  Opioid Risk Score:   Fall Risk Score:  `1  Depression screen [facility] 2/9     04/04/2022    1:36 PM 01/31/2021    2:15 PM  Depression screen PHQ 2/9  Decreased Interest 0 0  Down, Depressed, Hopeless 0 0  PHQ - 2 Score 0 0    Review of Systems  Genitourinary:  Positive for frequency. Negative for urgency.       Incontinence  Musculoskeletal:  Positive for gait problem.       Shoulder pain  Neurological:  Positive for tremors and weakness.  Hematological:  Bruises/bleeds easily.  Psychiatric/Behavioral:         Anxiety  All other systems reviewed and are negative.      Objective:   Physical Exam Gen: no distress, normal appearing HEENT: oral mucosa pink and moist, NCAT Cardio: Reg rate Chest: normal effort, normal rate of breathing Abd: soft, non-distended Ext: extremities edematous, bruising of right hand Psych: pleasant, normal affect Skin: intact Neuro: Alert and oriented x3      Assessment & Plan:  1) Cirrhosis: -discussed that she has been getting paracentesis weekly -discussed that when she developed this she developed hypotension  2) Anemia: -discussed that hemoglobins have been stable  3) Debility: -discussed that she is limited in ambulation due to her leg swelling -start PT and OT -discussed goals for walking and resistance training  4) Hypotension: -discussed that she does not feel dizzy unless she has just had paracentesis  5) Lower extremity edema: -compression garments ordered  6) Lumbar spinal stenosis: -discussed use of heating pad  7) Peripheral neuropathy: -Discussed Qutenza as an option for neuropathic pain control. Discussed that this is a capsaicin patch, stronger than capsaicin cream. Discussed that it is currently approved for  diabetic peripheral neuropathy and post-herpetic neuralgia, but that it has also shown benefit in treating other forms of neuropathy. Provided patient with link to site to learn more about the patch: https://www.clark.biz/. Discussed that the patch would be placed in office and benefits usually last 3 months. Discussed that unintended exposure to  capsaicin can cause severe irritation of eyes, mucous membranes, respiratory tract, and skin, but that Qutenza is a local treatment and does not have the systemic side effects of other nerve medications. Discussed that there may be pain, itching, erythema, and decreased sensory function associated with the application of Qutenza. Side effects usually subside within 1 week. A cold pack of analgesic medications can help with these side effects. Blood pressure can also be increased due to pain associated with administration of the patch.

## 2023-02-18 ENCOUNTER — Inpatient Hospital Stay: Payer: Medicare Other

## 2023-02-18 DIAGNOSIS — D500 Iron deficiency anemia secondary to blood loss (chronic): Secondary | ICD-10-CM | POA: Diagnosis not present

## 2023-02-18 DIAGNOSIS — D696 Thrombocytopenia, unspecified: Secondary | ICD-10-CM

## 2023-02-18 LAB — TYPE AND SCREEN

## 2023-02-18 LAB — BPAM RBC: Blood Product Expiration Date: 202407222359

## 2023-02-18 MED ORDER — SODIUM CHLORIDE 0.9% IV SOLUTION
250.0000 mL | Freq: Once | INTRAVENOUS | Status: AC
  Administered 2023-02-18: 250 mL via INTRAVENOUS

## 2023-02-18 MED ORDER — HEPARIN SOD (PORK) LOCK FLUSH 100 UNIT/ML IV SOLN
500.0000 [IU] | Freq: Every day | INTRAVENOUS | Status: AC | PRN
  Administered 2023-02-18: 500 [IU]

## 2023-02-18 MED ORDER — SODIUM CHLORIDE 0.9% FLUSH
10.0000 mL | INTRAVENOUS | Status: AC | PRN
  Administered 2023-02-18: 10 mL

## 2023-02-18 NOTE — Patient Instructions (Signed)
[facility]  Discharge Instructions: Thank you for choosing [facility] to provide your oncology and hematology care.  If you have a lab appointment with the Cancer Center - please note that after April 8th, 2024, all labs will be drawn in the cancer center.  You do not have to check in or register with the main entrance as you have in the past but will complete your check-in in the cancer center.  Wear comfortable clothing and clothing appropriate for easy access to any Portacath or PICC line.   We strive to give you quality time with your provider. You may need to reschedule your appointment if you arrive late (15 or more minutes).  Arriving late affects you and other patients whose appointments are after yours.  Also, if you miss three or more appointments without notifying the office, you may be dismissed from the clinic at the provider's discretion.      For prescription refill requests, have your pharmacy contact our office and allow 72 hours for refills to be completed.    Today you received the following chemotherapy and/or immunotherapy agents Blood transfusion      To help prevent nausea and vomiting after your treatment, we encourage you to take your nausea medication as directed.  BELOW ARE SYMPTOMS THAT SHOULD BE REPORTED IMMEDIATELY: *FEVER GREATER THAN 100.4 F (38 C) OR HIGHER *CHILLS OR SWEATING *NAUSEA AND VOMITING THAT IS NOT CONTROLLED WITH YOUR NAUSEA MEDICATION *UNUSUAL SHORTNESS OF BREATH *UNUSUAL BRUISING OR BLEEDING *URINARY PROBLEMS (pain or burning when urinating, or frequent urination) *BOWEL PROBLEMS (unusual diarrhea, constipation, pain near the anus) TENDERNESS IN MOUTH AND THROAT WITH OR WITHOUT PRESENCE OF ULCERS (sore throat, sores in mouth, or a toothache) UNUSUAL RASH, SWELLING OR PAIN  UNUSUAL VAGINAL DISCHARGE OR ITCHING   Items with * indicate a potential emergency and should be followed up as soon as possible or go  to the Emergency Department if any problems should occur.  Please show the CHEMOTHERAPY ALERT CARD or IMMUNOTHERAPY ALERT CARD at check-in to the Emergency Department and triage nurse.  Should you have questions after your visit or need to cancel or reschedule your appointment, please contact [facility] [phone]  and follow the prompts.  Office hours are 8:00 a.m. to 4:30 p.m. Monday - Friday. Please note that voicemails left after 4:00 p.m. may not be returned until the following business day.  We are closed weekends and major holidays. You have access to a nurse at all times for urgent questions. Please call the main number to the clinic [phone] and follow the prompts.  For any non-urgent questions, you may also contact your provider using MyChart. We now offer e-Visits for anyone 97 and older to request care online for non-urgent symptoms. For details visit [email or web address].   Also download the MyChart app! Go to the app store, search "MyChart", open the app, select [facility], and log in with your MyChart username and password.

## 2023-02-18 NOTE — Progress Notes (Signed)
Patient took Tylenol and Benadryl from home.

## 2023-02-18 NOTE — Progress Notes (Signed)
Patient presents today for 1UPRBC per providers order.  Vital signs WNL patient has no new complaints at this time.  Treatment given today per MD orders.  Stable during infusion without adverse affects.  Vital signs stable.  No complaints at this time.  Discharge from clinic via wheelchair in stable condition.  Alert and oriented X 3.  Follow up with [facility] as scheduled.

## 2023-02-19 LAB — BPAM RBC
ISSUE DATE / TIME: 202407031329
Unit Type and Rh: 6200

## 2023-02-19 LAB — TYPE AND SCREEN
ABO/RH(D): A POS
Antibody Screen: NEGATIVE
Unit division: 0

## 2023-02-20 ENCOUNTER — Encounter: Payer: Self-pay | Admitting: Hematology

## 2023-02-20 ENCOUNTER — Other Ambulatory Visit: Payer: Self-pay | Admitting: Physician Assistant

## 2023-02-22 NOTE — Progress Notes (Unsigned)
[facility] [address], [place] [address]   CLINIC:  Medical Oncology/Hematology  PCP:  Lauro Regulus, MD [address] [facility] [place] [address] [phone]  REASON FOR VISIT:  Follow-up for iron deficiency anemia and thalassemia    CURRENT THERAPY: Iron infusions and blood transfusions as needed  INTERVAL HISTORY:   Rachel Huber 67 y.o. 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  She was last seen by Rojelio Brenner PA-C on 12/17/2022.   Since her last visit, she was hospitalized three times: 12/17/2022 through 12/20/2022: Recurrent blood loss anemia with Hgb 4.9, s/p 4 units PRBC.  Flexible sigmoidoscopy on 12/19/2022 showed large external hemorrhoids and internal hemorrhoids. 01/06/2023 through 01/19/2023: Partial splenic embolization on 01/06/2023 by IR, followed by fever secondary to questionable small splenic infarction.  Complicated by acute hepatic encephalopathy and recurrent anemia requiring blood transfusions. Inpatient rehab from 01/19/2023 through 01/26/2023  She continues to receive regular paracentesis and frequent blood transfusions.  She had TIPS revision at [facility] on 08/20/2022.  She follows closely with Dr. Elby Showers (interventional radiology), Dr. Mia Creek (gastroenterology), and [facility] liver transplant team.  At today's visit, she reports feeling fairly well.  She has had significantly improved rectal bleeding since PSE in May 2024, and has also noticed decreased ascites and need for paracentesis.  She reports improved energy.  She has persistent bilateral lower extremity edema, chronic headaches, and lightheadedness without syncope. She continues to have orthopnea and intermittent dyspnea on exertion, although this has also improved.  She continues to take vitamin D 50,000 units other 2 weeks and daily vitamin B12  supplements.  She has 75% energy and 100% appetite. She endorses that she is maintaining a stable weight.  ASSESSMENT & PLAN:  1.  Severe multifactorial anemia - ETIOLOGY OF ANEMIA: Chronic GI blood loss + beta thalassemia intermedia + CKD.  She may also have some underlying degree of hemolysis from her beta thalassemia intermedia. - Colonoscopies in February 2022 & April 2022 showed grade III/IV nonbleeding internal hemorrhoids, polyps, and area of significantly congested mucosa - EGD in February 2022 & April 2022 showed portal hypertensive gastropathy and erythematous duodenopathy - She has had frequent hospitalizations and blood transfusions over the past year, with notable events recorded below:  Flexible sigmoidoscopy on 02/06/2022 showed prolapsed hemorrhoids and internal hemorrhoids.  Interventional radiology performed bilateral superior rectal artery particle and coil embolization on 02/07/2022. Most recent coil embolization of rectal arteries on 11/05/2022 Flexible sigmoidoscopy (12/19/2022) internal and external hemorrhoids, scant oozing from hemorrhoids. S/p partial splenic embolization by IR on 01/06/2023 - During hospitalizations, patient has developed recurrent AKI's with evolving CKD - Trial of ESA in January 2024 for treatment of her CKD associated anemia: Single dose of Retacrit 10,000 units on 09/17/2022, on the same day that she had to be admitted to the hospital for sepsis.  Patient concerned that Retacrit was the reason for her hospital admission in. We have discussed that while fever can be a side effect of Retacrit injection in some patients, her fever was likely due to sepsis from bacteremia.  She remains hesitant to try any ESA injections in the future.   - Most recent labs (02/16/2023): Hgb 6.2/MCV 69.6.  Normal WBC and platelets.  (S/p PRBC x 1 on 02/20/2023) Ferritin 900, iron saturation 92% Creatinine 1.34/GFR 44 Normal copper, folate, and SPEP on previous labs. -  History of  severe rectal bleeding, which is improved following splenic embolization - PLAN: We will continue to watch iron levels closely as she is also at risk for iron overload due to frequent blood transfusions.  Would consider chelating agents if ferritin > 16109, but cautiously due to risk of ADEs in the setting of comorbid CKD and liver cirrhoshis - Weekly CBC + BB sample with possible transfusion.    - Transfusion threshold is < 7.0. - Full lab panel and office visit in 3 months.  Recommend MD visit with DR. KATRAGADDA since she has not had MD evaluation since June 2022.  She can return to APP list after MD evaluation.  2.  Thrombocytopenia & leukopenia  - Intermittent leukopenia since January 2022 (intermittently neutropenia and lymphopenia) - Moderate thrombocytopenia since January 2022, with baseline platelets around 100-150 - CTA abdomen/pelvis (02/05/2022) shows splenomegaly at 17.3 cm - She is s/p partial splenic embolization by IR on 01/06/2023 - Most recent CBC/D (02/16/2023): WBC 4.5/normal differential.  Normal platelets 212. - PLAN: Differential diagnosis favors thrombocytopenia and leukopenia secondary to splenic sequestration and cirrhosis.  We will continue to monitor closely and consider additional workup such as bone marrow biopsy if any major deviations from baseline that are out of proportion to overall clinical picture  3.  Vitamin B12 deficiency - Noted on 06/24/2021 to have normal vitamin B12 457, but with elevated methylmalonic acid 496 - Most recent labs (12/10/2022) with B12 698 and methylmalonic acid 535  - She is taking vitamin B12 supplement 1000 mcg daily   - PLAN: Continue vitamin B12 to 1000 mcg daily to see if this will bring her MMA back to normal.  Would also consider that if MMA remains elevated this may be due to her underlying CKD. - We will recheck B12/methylmalonic acid at follow-up visit at next follow-up visit   4.  Beta thalassemia intermedia  - She reports that  she has always been anemic, but was not diagnosed with thalassemia until around age 75.  Her father, sister, and brother all have thalassemia. - Her hemoglobin has been as low as 4.6, and she has received  only 1 blood transfusion in the past related to her rectal bleeding.  She has not required regular transfusion for her thalassemia. 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  Discussed extensively with Dr. Ellin Saba, who felt that she may have some underlying degree of hemolysis from her beta thalassemia intermedia, although low haptoglobin may also be from cirrhosis with high reticulocytes from acute blood loss anemia.   - Her highest hemoglobin is around 9-10, but her baseline hemoglobin usually runs from 7.0-8.0 - PLAN: We will transfuse as needed if < 7.0 or severely symptomatic from anemia.     5.  Vitamin D deficiency - Vitamin D deficiency noted at 19.97 (06/24/2021) - Taking vitamin D 50,000 units weekly since October 2023.  Decreased to every other week dosing in February 2024. - Most recent vitamin D (12/10/2022) normal at 33.75 - PLAN: Continue vitamin D 50,000 units every 2 weeks.  Will recheck vitamin D at follow-up visit   6.  History of uterine cancer -  Diagnosed in 2010, she has total hysterectomy and bilateral oophorectomy - She did not require any chemotherapy or radiation - She was treated in [place], [place] - Her last CT imaging of her abdomen and pelvis showed no evidence of cancer recurrence   7.  Decompensated non-alcoholic liver  cirrhosis - Diagnosed in February 2022 - She decompensated liver cirrhosis with ascites, requires regular paracentesis - EGD showed signs of portal hypertension - She is aware that she should avoid NSAID and aspirin containing patients indefinitely - She follows with [facility] as well as [facility] hepatology - TIPS procedure on 12/26/2021 with revision on 03/12/2022 and 08/20/2022. - Continues to follow closely with Dr. Mia Creek (GI) and Dr. Elby Showers (interventional radiology). - Per Dr. Elby Showers, he is planning for partial splenic embolization on 12/29/22 at [facility] to decrease her portal hypertension and improve her blood/coagulation profile.  Further reembolization of rectal arteries likely would be unsuccessful, as that has been done 3 times without significant improvement.  Portal hypertension/venous pressures are thought to be main driver of ongoing rectal bleeding.  PLAN SUMMARY: >> Weekly CBC + BB sample + possible transfusion (day after labs) >> Full lab panel in 3 months = CBC/D, CMP, ferritin, iron/TIBC, B12, MMA, vitamin D >> MD VISIT w/ DR. KATRAGADDA in 3 months (1 week after full lab panel)  ** Can return to APP list after MD visit    REVIEW OF SYSTEMS:  Review of Systems  Constitutional:  Positive for fatigue. Negative for appetite change, chills, diaphoresis, fever and unexpected weight change.  HENT:   Negative for lump/mass and nosebleeds.   Eyes:  Negative for eye problems.  Respiratory:  Positive for shortness of breath (with exertion). Negative for cough and hemoptysis.   Cardiovascular:  Positive for leg swelling. Negative for chest pain and palpitations.  Gastrointestinal:  Positive for diarrhea. Negative for abdominal distention, abdominal pain, blood in stool, constipation, nausea and vomiting.  Genitourinary:  Negative for hematuria.   Musculoskeletal:  Positive for arthralgias.  Skin:  Negative for itching.  Neurological:  Positive for headaches and numbness.   Hematological:  Bruises/bleeds easily.     PHYSICAL EXAM:  ECOG PERFORMANCE STATUS: 3 - Symptomatic, >50% confined to bed  There were no vitals filed for this visit. There were no vitals filed for this visit. Physical Exam Constitutional:      Appearance: Normal appearance. She is obese.  HENT:     Head: Normocephalic and atraumatic.     Mouth/Throat:     Mouth: Mucous membranes are moist.  Eyes:     Extraocular Movements: Extraocular movements intact.     Pupils: Pupils are equal, round, and reactive to light.  Cardiovascular:     Rate and Rhythm: Normal rate and regular rhythm.     Pulses: Normal pulses.     Heart sounds: Normal heart sounds.  Pulmonary:     Effort: Pulmonary effort is normal.     Breath sounds: Normal breath sounds.  Abdominal:     General: Bowel sounds are normal. There is distension.     Palpations: Abdomen is soft.     Tenderness: There is no abdominal tenderness.  Musculoskeletal:        General: No swelling.     Right lower leg: Edema (2+) present.     Left lower leg: Edema (2+) present.  Lymphadenopathy:     Cervical: No cervical adenopathy.  Skin:    General: Skin is warm and dry.     Comments: Significant  skin thickening and hyperpigmentation over bilateral lower extremities in keeping with stasis dermatitis  Neurological:     General: No focal deficit present.     Mental Status: She is alert and oriented to person, place, and time.  Psychiatric:        Mood and Affect: Mood normal.        Behavior: Behavior normal.     PAST MEDICAL/SURGICAL HISTORY:  Past Medical History:  Diagnosis Date   Anemia in chronic kidney disease (CKD) 02/07/2022   Anxiety    Arthritis    Ascites of liver    Weekly paracentesis   Cirrhosis of liver (HCC)    CKD (chronic kidney disease) stage 4, GFR 15-29 ml/min (HCC)    GERD (gastroesophageal reflux disease)    Grade IV internal hemorrhoids    Hypertension    NASH (nonalcoholic steatohepatitis)     Neuropathy, diabetic (HCC)    Obesity (BMI 30-39.9)    OSA on CPAP    Pneumonia    Portal hypertension (HCC)    S/P TIPS (transjugular intrahepatic portosystemic shunt)    Thalassemia minor 1992   Past Surgical History:  Procedure Laterality Date   ABDOMINAL HYSTERECTOMY     BREAST BIOPSY Right 02/15/2018   [place] bx 6-6:30 ribbon shape, ONE CORE FRAGMENT WITH FIBROSIS. ONE CORE FRAGMENT WITH PORTION OF A DILATED   BREAST BIOPSY Right 02/15/2018   [place] bx 9:00 heart shape, USUAL DUCTAL HYPERPLASIA   BREAST LUMPECTOMY Right 03/09/2018   Procedure: BREAST LUMPECTOMY x 2;  Surgeon: Sung Amabile, DO;  Location: [facility] ORS;  Service: General;  Laterality: Right;   CATARACT EXTRACTION W/PHACO Left 06/11/2016   Procedure: CATARACT EXTRACTION PHACO AND INTRAOCULAR LENS PLACEMENT (IOC);  Surgeon: Sallee Lange, MD;  Location: [facility] ORS;  Service: Ophthalmology;  Laterality: Left;  Lot # [id number] H US:01:38.6 AP%:26.4 CDE:44.15   CATARACT EXTRACTION W/PHACO Right 06/15/2018   Procedure: CATARACT EXTRACTION PHACO AND INTRAOCULAR LENS PLACEMENT (IOC);  Surgeon: Galen Manila, MD;  Location: [facility] ORS;  Service: Ophthalmology;  Laterality: Right;  [place] 00:38.2 CDE 4.23 Fluid Pack Lot # [id number]   COLONOSCOPIES     COLONOSCOPY WITH PROPOFOL N/A 10/10/2020   Procedure: COLONOSCOPY WITH PROPOFOL;  Surgeon: Regis Bill, MD;  Location: [facility] ENDOSCOPY;  Service: Endoscopy;  Laterality: N/A;   COLONOSCOPY WITH PROPOFOL N/A 11/20/2020   Procedure: COLONOSCOPY WITH PROPOFOL;  Surgeon: Regis Bill, MD;  Location: [facility] ENDOSCOPY;  Service: Endoscopy;  Laterality: N/A;  DM STAT CBC, BMP COVID POSITIVE 09/02/2020   COLONOSCOPY WITH PROPOFOL N/A 06/19/2022   Procedure: COLONOSCOPY WITH PROPOFOL;  Surgeon: Regis Bill, MD;  Location: [facility] ENDOSCOPY;  Service: Endoscopy;  Laterality: N/A;   CTR     ESOPHAGOGASTRODUODENOSCOPY (EGD) WITH PROPOFOL N/A 10/10/2020   Procedure:  ESOPHAGOGASTRODUODENOSCOPY (EGD) WITH PROPOFOL;  Surgeon: Regis Bill, MD;  Location: [facility] ENDOSCOPY;  Service: Endoscopy;  Laterality: N/A;  COVID POSITIVE 10/08/2020   FLEXIBLE SIGMOIDOSCOPY N/A 02/06/2022   Procedure: FLEXIBLE SIGMOIDOSCOPY;  Surgeon: Regis Bill, MD;  Location: [facility] ENDOSCOPY;  Service: Endoscopy;  Laterality: N/A;  Patient requests anesthesia   FLEXIBLE SIGMOIDOSCOPY N/A 12/19/2022   Procedure: FLEXIBLE SIGMOIDOSCOPY;  Surgeon: Dolores Frame, MD;  Location: [facility] SUITE;  Service: Gastroenterology;  Laterality: N/A;   IR ANGIOGRAM SELECTIVE EACH ADDITIONAL VESSEL  02/07/2022   IR ANGIOGRAM SELECTIVE EACH ADDITIONAL VESSEL  02/07/2022   IR ANGIOGRAM SELECTIVE EACH ADDITIONAL VESSEL  09/05/2022   IR ANGIOGRAM VISCERAL SELECTIVE  02/07/2022  IR ANGIOGRAM VISCERAL SELECTIVE  09/05/2022   IR EMBO ART  VEN HEMORR LYMPH EXTRAV  INC GUIDE ROADMAPPING  09/05/2022   IR EMBO ART  VEN HEMORR LYMPH EXTRAV  INC GUIDE ROADMAPPING  11/05/2022   IR EMBO ARTERIAL NOT HEMORR HEMANG INC GUIDE ROADMAPPING  02/07/2022   IR EMBO TUMOR ORGAN ISCHEMIA INFARCT INC GUIDE ROADMAPPING  01/06/2023   IR IMAGING GUIDED PORT INSERTION  06/13/2022   IR IMAGING GUIDED PORT INSERTION  11/25/2022   IR PARACENTESIS  12/17/2021   IR PARACENTESIS  03/25/2022   IR PARACENTESIS  11/25/2022   IR PARACENTESIS  01/06/2023   IR PARACENTESIS  01/19/2023   IR RADIOLOGIST EVAL & MGMT  03/18/2022   IR RADIOLOGIST EVAL & MGMT  05/07/2022   IR RADIOLOGIST EVAL & MGMT  05/16/2022   IR RADIOLOGIST EVAL & MGMT  10/13/2022   IR RADIOLOGIST EVAL & MGMT  12/12/2022   IR US GUIDE VASC ACCESS RIGHT  02/07/2022   IR US GUIDE VASC ACCESS RIGHT  09/05/2022   JOINT REPLACEMENT     KNEE SURGERY Right 09/24/2017   plates and pins   PORT-A-CATH REMOVAL N/A 09/23/2022   Procedure: MINOR REMOVAL PORT-A-CATH;  Surgeon: Franky Macho, MD;  Location: [facility] ORS;  Service: General;  Laterality: N/A;   TEE WITHOUT CARDIOVERSION N/A 09/23/2022    Procedure: TRANSESOPHAGEAL ECHOCARDIOGRAM (TEE);  Surgeon: Antoine Poche, MD;  Location: [facility];  Service: Endoscopy;  Laterality: N/A;   TOTAL SHOULDER ARTHROPLASTY Right 09/27/2015    SOCIAL HISTORY:  Social History   Socioeconomic History   Marital status: Married    Spouse name: Not on file   Number of children: 0   Years of education: Not on file   Highest education level: Not on file  Occupational History   Occupation: EMPLOYED  Tobacco Use   Smoking status: Never   Smokeless tobacco: Never  Vaping Use   Vaping Use: Never used  Substance and Sexual Activity   Alcohol use: No   Drug use: Never   Sexual activity: Not Currently  Other Topics Concern   Not on file  Social History Narrative   Not on file   Social Determinants of Health   Financial Resource Strain: Low Risk  (04/04/2022)   Overall Financial Resource Strain (CARDIA)    Difficulty of Paying Living Expenses: Not hard at all  Food Insecurity: Patient Declined (01/06/2023)   Hunger Vital Sign    Worried About Running Out of Food in the Last Year: Patient declined    Ran Out of Food in the Last Year: Patient declined  Transportation Needs: Patient Declined (01/06/2023)   PRAPARE - Administrator, Civil Service (Medical): Patient declined    Lack of Transportation (Non-Medical): Patient declined  Physical Activity: Inactive (04/04/2022)   Exercise Vital Sign    Days of Exercise per Week: 0 days    Minutes of Exercise per Session: 0 min  Stress: No Stress Concern Present (04/04/2022)   [employer] of Occupational Health - Occupational Stress Questionnaire    Feeling of Stress : Only a little  Social Connections: Moderately Isolated (04/04/2022)   Social Connection and Isolation Panel [NHANES]    Frequency of Communication with Friends and Family: More than three times a week    Frequency of Social Gatherings with Friends and Family: Three times a week    Attends Religious Services:  Never    Active Member of Clubs or Organizations: No    Attends  Club or Organization Meetings: Never    Marital Status: Married  Catering manager Violence: Patient Declined (01/06/2023)   Humiliation, Afraid, Rape, and Kick questionnaire    Fear of Current or Ex-Partner: Patient declined    Emotionally Abused: Patient declined    Physically Abused: Patient declined    Sexually Abused: Patient declined    FAMILY HISTORY:  Family History  Problem Relation Age of Onset   Breast cancer Mother 20   Lymphoma Mother    Diabetes Father    Kidney cancer Father    Heart disease Father    Diabetes Sister    Breast cancer Sister 20    CURRENT MEDICATIONS:  Outpatient Encounter Medications as of 02/23/2023  Medication Sig   acetaminophen (TYLENOL) 500 MG tablet Take 1 tablet (500 mg total) by mouth 2 (two) times daily as needed for mild pain.   albuterol (VENTOLIN HFA) 108 (90 Base) MCG/ACT inhaler Inhale 2 puffs into the lungs every 6 (six) hours as needed for wheezing or shortness of breath.   atorvastatin (LIPITOR) 10 MG tablet Take 1 tablet (10 mg total) by mouth daily.   butalbital-acetaminophen-caffeine (FIORICET) 50-325-40 MG tablet Take 1 tablet by mouth every 6 (six) hours as needed for headache.   diclofenac Sodium (VOLTAREN) 1 % GEL Apply 4 g topically 4 (four) times daily.   gabapentin (NEURONTIN) 100 MG capsule Take 1 capsule (100 mg total) by mouth 2 (two) times daily.   lactulose (CHRONULAC) 10 GM/15ML solution Take 30 mLs (20 g total) by mouth 3 (three) times daily.   Multiple Vitamins-Minerals (MULTIVITAMIN WITH MINERALS) tablet Take 1 tablet by mouth daily.   sertraline (ZOLOFT) 50 MG tablet Take 1 tablet (50 mg total) by mouth daily.   spironolactone (ALDACTONE) 50 MG tablet Take 1 tablet (50 mg total) by mouth daily.   torsemide (DEMADEX) 10 MG tablet Take 30 mg by mouth daily.   torsemide (DEMADEX) 20 MG tablet Take 40 mg by mouth daily.   torsemide 40 MG TABS Take 40 mg  by mouth daily.   Vitamin D, Ergocalciferol, (DRISDOL) 1.25 MG (50000 UNIT) CAPS capsule Take 1 capsule by mouth once a week   Facility-Administered Encounter Medications as of 02/23/2023  Medication   diphenhydrAMINE (BENADRYL) capsule 25 mg    ALLERGIES:  Allergies  Allergen Reactions   Latex Rash    Contact rash   Tape Other (See Comments)    Skin sensitivity   Gramineae Pollens Other (See Comments)    Sneezing, Running nose  Other Reaction(s): Other (see comments)    LABORATORY DATA:  I have reviewed the labs as listed.  CBC    Component Value Date/Time   WBC 4.5 02/16/2023 1121   RBC 2.93 (L) 02/16/2023 1121   HGB 6.2 (LL) 02/16/2023 1121   HCT 20.4 (L) 02/16/2023 1121   PLT 212 02/16/2023 1121   MCV 69.6 (L) 02/16/2023 1121   MCH 21.2 (L) 02/16/2023 1121   MCHC 30.4 02/16/2023 1121   RDW 21.6 (H) 02/16/2023 1121   LYMPHSABS 1.3 02/16/2023 1121   MONOABS 0.5 02/16/2023 1121   EOSABS 0.4 02/16/2023 1121   BASOSABS 0.0 02/16/2023 1121      Latest Ref Rng & Units 02/16/2023   11:21 AM 01/26/2023    5:06 AM 01/20/2023    3:16 AM  CMP  Glucose 70 - 99 mg/dL 469  629  528   BUN 8 - 23 mg/dL 26  34  36   Creatinine 0.44 - 1.00  mg/dL 1.61  0.96  0.45   Sodium 135 - 145 mmol/L 134  133  132   Potassium 3.5 - 5.1 mmol/L 3.8  3.3  3.9   Chloride 98 - 111 mmol/L 104  100  99   CO2 22 - 32 mmol/L 22  25  25    Calcium 8.9 - 10.3 mg/dL 8.1  7.6  7.7   Total Protein 6.5 - 8.1 g/dL 5.1   4.1   Total Bilirubin 0.3 - 1.2 mg/dL 1.8   1.4   Alkaline Phos 38 - 126 U/L 137   162   AST 15 - 41 U/L 40   77   ALT 0 - 44 U/L 20   47     DIAGNOSTIC IMAGING:  I have independently reviewed the relevant imaging and discussed with the patient.   WRAP UP:  All questions were answered. The patient knows to call the clinic with any problems, questions or concerns.  Medical decision making: Moderate  Time spent on visit: I spent 20 minutes counseling the patient face to face. The  total time spent in the appointment was 30 minutes and more than 50% was on counseling.  Carnella Guadalajara, PA-C  02/23/23 4:52 PM

## 2023-02-23 ENCOUNTER — Encounter: Payer: Self-pay | Admitting: Physician Assistant

## 2023-02-23 ENCOUNTER — Inpatient Hospital Stay (HOSPITAL_BASED_OUTPATIENT_CLINIC_OR_DEPARTMENT_OTHER): Payer: Medicare Other | Admitting: Physician Assistant

## 2023-02-23 VITALS — BP 123/51 | HR 77 | Temp 97.5°F | Resp 18 | Wt 250.0 lb

## 2023-02-23 DIAGNOSIS — E559 Vitamin D deficiency, unspecified: Secondary | ICD-10-CM

## 2023-02-23 DIAGNOSIS — N1832 Chronic kidney disease, stage 3b: Secondary | ICD-10-CM

## 2023-02-23 DIAGNOSIS — D696 Thrombocytopenia, unspecified: Secondary | ICD-10-CM

## 2023-02-23 DIAGNOSIS — D500 Iron deficiency anemia secondary to blood loss (chronic): Secondary | ICD-10-CM | POA: Diagnosis not present

## 2023-02-23 DIAGNOSIS — D561 Beta thalassemia: Secondary | ICD-10-CM

## 2023-02-23 DIAGNOSIS — E538 Deficiency of other specified B group vitamins: Secondary | ICD-10-CM

## 2023-02-23 DIAGNOSIS — D631 Anemia in chronic kidney disease: Secondary | ICD-10-CM

## 2023-02-24 ENCOUNTER — Ambulatory Visit: Payer: 59 | Admitting: Physician Assistant

## 2023-02-24 ENCOUNTER — Ambulatory Visit
Admission: RE | Admit: 2023-02-24 | Discharge: 2023-02-24 | Disposition: A | Discharge status: Home / Self Care | Payer: Medicare Other | Source: Ambulatory Visit | Attending: Gastroenterology | Admitting: Gastroenterology

## 2023-02-24 ENCOUNTER — Ambulatory Visit
Admission: RE | Admit: 2023-02-24 | Discharge: 2023-02-24 | Disposition: A | Discharge status: Home / Self Care | Payer: Medicare Other | Source: Ambulatory Visit | Attending: Interventional Radiology | Admitting: Interventional Radiology

## 2023-02-24 ENCOUNTER — Other Ambulatory Visit: Payer: Self-pay | Admitting: Interventional Radiology

## 2023-02-24 DIAGNOSIS — K7581 Nonalcoholic steatohepatitis (NASH): Secondary | ICD-10-CM | POA: Insufficient documentation

## 2023-02-24 DIAGNOSIS — Z95828 Presence of other vascular implants and grafts: Secondary | ICD-10-CM | POA: Diagnosis present

## 2023-02-24 DIAGNOSIS — K7460 Unspecified cirrhosis of liver: Secondary | ICD-10-CM | POA: Insufficient documentation

## 2023-02-24 HISTORY — PX: IR CV LINE INJECTION: IMG2294

## 2023-02-24 MED ORDER — ALBUMIN HUMAN 25 % IV SOLN
25.0000 g | Freq: Once | INTRAVENOUS | Status: AC
  Administered 2023-02-24: 25 g via INTRAVENOUS

## 2023-02-24 MED ORDER — IOHEXOL 300 MG/ML  SOLN
10.0000 mL | Freq: Once | INTRAMUSCULAR | Status: AC | PRN
  Administered 2023-02-24: 10 mL via INTRAVENOUS

## 2023-02-24 MED ORDER — LIDOCAINE HCL (PF) 1 % IJ SOLN
10.0000 mL | Freq: Once | INTRAMUSCULAR | Status: AC
  Administered 2023-02-24: 10 mL via INTRADERMAL

## 2023-02-24 MED ORDER — HEPARIN SOD (PORK) LOCK FLUSH 100 UNIT/ML IV SOLN: 500.0000 [IU] | Freq: Once | INTRAVENOUS | Status: AC

## 2023-02-24 MED ORDER — HEPARIN SOD (PORK) LOCK FLUSH 100 UNIT/ML IV SOLN
INTRAVENOUS | Status: AC
  Filled 2023-02-24: qty 5

## 2023-02-24 MED ORDER — HEPARIN SOD (PORK) LOCK FLUSH 100 UNIT/ML IV SOLN
500.0000 [IU] | Freq: Once | INTRAVENOUS | Status: AC
  Administered 2023-02-24: 500 [IU] via INTRAVENOUS

## 2023-02-24 MED ORDER — ALBUMIN HUMAN 25 % IV SOLN
INTRAVENOUS | Status: AC
  Filled 2023-02-24: qty 100

## 2023-02-24 NOTE — Progress Notes (Signed)
Called office of Dr Eather Colas regarding albumin order that was faxed/scanned into system for patient. Calling to clarify order to give albumin for the 3.9L fluid removed today. Awaiting callback.

## 2023-02-24 NOTE — Progress Notes (Signed)
Telephone clarification from Dr Nedra Hai rearding albumin order. Ordered to  administer albumin 25g based on the 3.9L removed today during paracentesis. RN will fax updated order for future clarification.

## 2023-02-24 NOTE — Progress Notes (Signed)
Patient here today for Albumin infusion post paracentesis.  Challenges accessing Port-a-Catheter with multiple attempts as patient moved with each attempt. PIV started and patient received ordered Albumin.  Discussed with patient and Interventional Radiology (IR): see IR note for details.  Discussed with patient plan and patient will apply EMLA cream prior to needing Port access.  Patient states she has the EMLA cream but forgets to use it.  Let her know we can apply it once she is here as well.  In addition, next access attempt could be while patient sitting in chair as she states this has worked in past.  PIV discontinued post Albumin transfusion and patient discharged home with husband.

## 2023-02-24 NOTE — Procedures (Addendum)
Ultrasound-guided  therapeutic paracentesis performed yielding 3.9 liters of straw colored fluid. No immediate complications. EBL is none.

## 2023-02-25 ENCOUNTER — Ambulatory Visit (HOSPITAL_COMMUNITY): Payer: Medicare Other | Attending: Gastroenterology | Admitting: Occupational Therapy

## 2023-02-25 ENCOUNTER — Encounter (HOSPITAL_COMMUNITY): Payer: Self-pay | Admitting: Occupational Therapy

## 2023-02-25 DIAGNOSIS — M6281 Muscle weakness (generalized): Secondary | ICD-10-CM | POA: Insufficient documentation

## 2023-02-25 DIAGNOSIS — R278 Other lack of coordination: Secondary | ICD-10-CM | POA: Diagnosis present

## 2023-02-25 DIAGNOSIS — R2689 Other abnormalities of gait and mobility: Secondary | ICD-10-CM | POA: Insufficient documentation

## 2023-02-25 DIAGNOSIS — R29818 Other symptoms and signs involving the nervous system: Secondary | ICD-10-CM | POA: Diagnosis present

## 2023-02-25 DIAGNOSIS — K7682 Hepatic encephalopathy: Secondary | ICD-10-CM | POA: Insufficient documentation

## 2023-02-25 NOTE — Patient Instructions (Signed)
Perform each exercise ____10-15____ reps. 2-3x days.   1) Protraction   Start by holding a wand or cane at chest height.  Next, slowly push the wand outwards in front of your body so that your elbows become fully straightened. Then, return to the original position.     2) Shoulder FLEXION   In the standing position, hold a wand/cane with both arms, palms down on both sides. Raise up the wand/cane allowing your unaffected arm to perform most of the effort. Your affected arm should be partially relaxed.      3) Internal/External ROTATION   In the standing position, hold a wand/cane with both hands keeping your elbows bent. Move your arms and wand/cane to one side.  Your affected arm should be partially relaxed while your unaffected arm performs most of the effort.       4) Shoulder ABDUCTION   While holding a wand/cane palm face up on the injured side and palm face down on the uninjured side, slowly raise up your injured arm to the side.        5) Horizontal Abduction/Adduction      Straight arms holding cane at shoulder height, bring cane to right, center, left. Repeat starting to left.   Copyright  VHI. All rights reserved.      

## 2023-02-25 NOTE — Therapy (Signed)
OUTPATIENT OCCUPATIONAL THERAPY NEURO TREATMENT NOTE  Patient Name: Rachel Huber MRN: [id number] DOB:01-14-1956, 67 y.o., female Today's Date: 02/25/2023  PCP: Einar Crow, MD REFERRING PROVIDER: Cruzita Lederer, PA-C  END OF SESSION:  OT End of Session - 02/25/23 1655     Visit Number 2    Number of Visits 7    Date for OT Re-Evaluation 04/03/23    Authorization Type Medicare Part A and B    OT Start Time 1431    OT Stop Time 1515    OT Time Calculation (min) 44 min    Activity Tolerance Patient tolerated treatment well    Behavior During Therapy WFL for tasks assessed/performed             Past Medical History:  Diagnosis Date   Anemia in chronic kidney disease (CKD) 02/07/2022   Anxiety    Arthritis    Ascites of liver    Weekly paracentesis   Cirrhosis of liver (HCC)    CKD (chronic kidney disease) stage 4, GFR 15-29 ml/min (HCC)    GERD (gastroesophageal reflux disease)    Grade IV internal hemorrhoids    Hypertension    NASH (nonalcoholic steatohepatitis)    Neuropathy, diabetic (HCC)    Obesity (BMI 30-39.9)    OSA on CPAP    Pneumonia    Portal hypertension (HCC)    S/P TIPS (transjugular intrahepatic portosystemic shunt)    Thalassemia minor 1992   Past Surgical History:  Procedure Laterality Date   ABDOMINAL HYSTERECTOMY     BREAST BIOPSY Right 02/15/2018   [place] bx 6-6:30 ribbon shape, ONE CORE FRAGMENT WITH FIBROSIS. ONE CORE FRAGMENT WITH PORTION OF A DILATED   BREAST BIOPSY Right 02/15/2018   [place] bx 9:00 heart shape, USUAL DUCTAL HYPERPLASIA   BREAST LUMPECTOMY Right 03/09/2018   Procedure: BREAST LUMPECTOMY x 2;  Surgeon: Sung Amabile, DO;  Location: [facility];  Service: General;  Laterality: Right;   CATARACT EXTRACTION W/PHACO Left 06/11/2016   Procedure: CATARACT EXTRACTION PHACO AND INTRAOCULAR LENS PLACEMENT (IOC);  Surgeon: Sallee Lange, MD;  Location: [facility];  Service: Ophthalmology;  Laterality: Left;  Lot #  [id number] H US:01:38.6 AP%:26.4 CDE:44.15   CATARACT EXTRACTION W/PHACO Right 06/15/2018   Procedure: CATARACT EXTRACTION PHACO AND INTRAOCULAR LENS PLACEMENT (IOC);  Surgeon: Galen Manila, MD;  Location: [facility];  Service: Ophthalmology;  Laterality: Right;  [place] 00:38.2 CDE 4.23 Fluid Pack Lot # [id number]   COLONOSCOPIES     COLONOSCOPY WITH PROPOFOL N/A 10/10/2020   Procedure: COLONOSCOPY WITH PROPOFOL;  Surgeon: Regis Bill, MD;  Location: [facility];  Service: Endoscopy;  Laterality: N/A;   COLONOSCOPY WITH PROPOFOL N/A 11/20/2020   Procedure: COLONOSCOPY WITH PROPOFOL;  Surgeon: Regis Bill, MD;  Location: [facility];  Service: Endoscopy;  Laterality: N/A;  DM STAT CBC, BMP COVID POSITIVE 09/02/2020   COLONOSCOPY WITH PROPOFOL N/A 06/19/2022   Procedure: COLONOSCOPY WITH PROPOFOL;  Surgeon: Regis Bill, MD;  Location: [facility];  Service: Endoscopy;  Laterality: N/A;   CTR     ESOPHAGOGASTRODUODENOSCOPY (EGD) WITH PROPOFOL N/A 10/10/2020   Procedure: ESOPHAGOGASTRODUODENOSCOPY (EGD) WITH PROPOFOL;  Surgeon: Regis Bill, MD;  Location: [facility];  Service: Endoscopy;  Laterality: N/A;  COVID POSITIVE 10/08/2020   FLEXIBLE SIGMOIDOSCOPY N/A 02/06/2022   Procedure: FLEXIBLE SIGMOIDOSCOPY;  Surgeon: Regis Bill, MD;  Location: [facility];  Service: Endoscopy;  Laterality: N/A;  Patient requests anesthesia   FLEXIBLE SIGMOIDOSCOPY N/A 12/19/2022   Procedure: FLEXIBLE SIGMOIDOSCOPY;  Surgeon: Marguerita Merles, Reuel Boom, MD;  Location: [facility];  Service: Gastroenterology;  Laterality: N/A;   IR ANGIOGRAM SELECTIVE EACH ADDITIONAL VESSEL  02/07/2022   IR ANGIOGRAM SELECTIVE EACH ADDITIONAL VESSEL  02/07/2022   IR ANGIOGRAM SELECTIVE EACH ADDITIONAL VESSEL  09/05/2022   IR ANGIOGRAM VISCERAL SELECTIVE  02/07/2022   IR ANGIOGRAM VISCERAL SELECTIVE  09/05/2022   IR CV LINE INJECTION  02/24/2023   IR EMBO ART  VEN HEMORR LYMPH EXTRAV  INC  GUIDE ROADMAPPING  09/05/2022   IR EMBO ART  VEN HEMORR LYMPH EXTRAV  INC GUIDE ROADMAPPING  11/05/2022   IR EMBO ARTERIAL NOT HEMORR HEMANG INC GUIDE ROADMAPPING  02/07/2022   IR EMBO TUMOR ORGAN ISCHEMIA INFARCT INC GUIDE ROADMAPPING  01/06/2023   IR IMAGING GUIDED PORT INSERTION  06/13/2022   IR IMAGING GUIDED PORT INSERTION  11/25/2022   IR PARACENTESIS  12/17/2021   IR PARACENTESIS  03/25/2022   IR PARACENTESIS  11/25/2022   IR PARACENTESIS  01/06/2023   IR PARACENTESIS  01/19/2023   IR RADIOLOGIST EVAL & MGMT  03/18/2022   IR RADIOLOGIST EVAL & MGMT  05/07/2022   IR RADIOLOGIST EVAL & MGMT  05/16/2022   IR RADIOLOGIST EVAL & MGMT  10/13/2022   IR RADIOLOGIST EVAL & MGMT  12/12/2022   IR US GUIDE VASC ACCESS RIGHT  02/07/2022   IR US GUIDE VASC ACCESS RIGHT  09/05/2022   JOINT REPLACEMENT     KNEE SURGERY Right 09/24/2017   plates and pins   PORT-A-CATH REMOVAL N/A 09/23/2022   Procedure: MINOR REMOVAL PORT-A-CATH;  Surgeon: Franky Macho, MD;  Location: [facility];  Service: General;  Laterality: N/A;   TEE WITHOUT CARDIOVERSION N/A 09/23/2022   Procedure: TRANSESOPHAGEAL ECHOCARDIOGRAM (TEE);  Surgeon: Antoine Poche, MD;  Location: [facility];  Service: Endoscopy;  Laterality: N/A;   TOTAL SHOULDER ARTHROPLASTY Right 09/27/2015   Patient Active Problem List   Diagnosis Date Noted   Acute hepatic encephalopathy (HCC) 01/19/2023   Pancytopenia (HCC) 01/06/2023   Anemia 01/06/2023   Internal hemorrhoid 01/06/2023   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   Symptomatic anemia 12/17/2022   Hypokalemia 11/06/2022   Acute on chronic anemia 11/03/2022   Hypotension 11/03/2022   AKI (acute kidney injury) (HCC) 10/15/2022   Rectal bleeding 06/17/2022   Obesity (BMI 30-39.9) 06/17/2022   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 mellitus (HCC) 06/17/2022   Grade IV internal hemorrhoids 05/16/2022   Thrombocytopenia (HCC) 03/05/2022   Anemia in chronic kidney disease (CKD) 02/07/2022   GI bleeding 02/04/2022   Chronic  kidney disease, stage 3b (HCC) 02/04/2022   Depression with anxiety 02/04/2022   Liver cirrhosis secondary to NASH (HCC)    Insomnia 01/27/2022   Iron deficiency anemia due to chronic blood loss 02/13/2021   History of uterine cancer 02/13/2021   Decompensated hepatic cirrhosis (HCC) 10/25/2020   OSA on CPAP 10/25/2020   Acute respiratory failure with hypoxia (HCC) 09/10/2020   Closed right hip fracture (HCC) 09/02/2020   Type 2 diabetes mellitus with hyperlipidemia (HCC) 09/02/2020   Thalassemia minor    HLD (hyperlipidemia)    Acute kidney injury superimposed on CKD (HCC)    Depression     ONSET DATE: 01/06/23  REFERRING DIAG: Hepatic Encephalopathy  THERAPY DIAG:  Other lack of coordination  Other symptoms and signs involving the nervous system  Rationale for Evaluation and Treatment: Rehabilitation  SUBJECTIVE:   SUBJECTIVE STATEMENT: "My arm can move more after you manipulated it." Pt accompanied  by: self  PERTINENT HISTORY: PMH significant for diabetes mellitus hypertension [name] status post TIPS revision at [facility] weekly paracentesis recurrent grade 4 internal hemorrhoids status post embolization of superior rectal artery 11/05/2022, multifactorial anemia, OSA with CPAP, CKD stage III, thrombocytopenia/leukopenia due to splenic sequestration/cirrhosis, beta thalassemia intermedia per hematology/oncology, hyperlipidemia, chronic orthostasis with ProAmatine.   PRECAUTIONS: None  WEIGHT BEARING RESTRICTIONS: No  PAIN:  Are you having pain? Yes: NPRS scale: 5/10 Pain location: anterior shoulder girdle Pain description: aching Aggravating factors: movement Relieving factors: Not moving it  FALLS: Has patient fallen in last 6 months? No  LIVING ENVIRONMENT: Lives with: lives with their spouse Lives in: House/apartment Has following equipment at home: Counselling psychologist, Environmental consultant - 4 wheeled, Tour manager, and Grab bars  PLOF: Independent with household  mobility with device and Requires assistive device for independence  PATIENT GOALS: To strengthen my arms  OBJECTIVE:   HAND DOMINANCE: Right  ADLs: Overall ADLs: Due to weakness and fatigue pt requires assist with all IADL's and compensatory strategies for BADL's  MOBILITY STATUS:  Independent with rollator  POSTURE COMMENTS:  No Significant postural limitations Sitting balance:  Independent  ACTIVITY TOLERANCE: Activity tolerance: Decreased endurance in standing to complete tasks, gives out quickly  FUNCTIONAL OUTCOME MEASURES: Upper Extremity Functional Scale (UEFS): 60.0%  UPPER EXTREMITY ROM:    Pt has full ROM  UPPER EXTREMITY MMT:   RUE weaker than left  MMT Right eval Left eval  Shoulder flexion 3+/5 4-/5  Shoulder abduction 3+/5 4-/5  Shoulder adduction 4-/5 4/5  Shoulder extension 4/5 4/5  Shoulder internal rotation 4-/5 4/5  Shoulder external rotation 4-/5 4/5  Elbow flexion 4/5 4+/5  Elbow extension 4-/5 4/5  Wrist flexion 4/5 4/5  Wrist extension 3+/5 4/5  Wrist ulnar deviation 4-/5 4/5  Wrist radial deviation 3+/5 4/5  Wrist pronation 3+/5 4/5  Wrist supination 3+/5 4/5  (Blank rows = not tested)  HAND FUNCTION: Grip strength: Right: -- lbs; Left: -- lbs, Lateral pinch: Right: -- lbs, Left: -- lbs, and 3 point pinch: Right: -- lbs, Left: -- lbs  COORDINATION: 9 Hole Peg test: Right: -- sec; Left: -- sec  SENSATION: WFL  EDEMA: No swelling noted, pt reports occasionally when she is retaining fluid.   OBSERVATIONS: RUE increased stiffness and pain since reverse total shoulder   TODAY'S TREATMENT:                                                                                                                              DATE:   02/25/23 -Manual Therapy: myofascial release and trigger point applied to BUE biceps, deltoids, trapezius, and scapular region, in order to reduce pain and fascial restrictions to improve ROM. -AA/ROM: supine, BUE,  flexion, abduction, protraction, horizontal abduction, er/IR, x10 -Proximal shoulder exercises: paddles, criss cross, circles both directions, x10   PATIENT EDUCATION: Education details: AA/ROM Person educated: Patient Education method: Explanation Education comprehension: verbalized  understanding  HOME EXERCISE PROGRAM: 7/10: AA/ROM   GOALS: Goals reviewed with patient? Yes  SHORT TERM GOALS: Target date: 04/03/23  Pt will be provided and educated on HEP for BUE mobility and strength for ADL completion.  Goal status: IN PROGRESS  2.  Pt will decrease pain in BUE to 3/10 or less in order to sleep for 3+ consecutive hours without waking due to pain.   Goal status: IN PROGRESS  3.  Pt will increased BUE strength to 4+/5 in order to lift items to complete cooking and cleaning tasks.   Goal status: IN PROGRESS  4.  Pt will increase BUE grip strength by 10# and pinch strength by 3#.  Goal status: IN PROGRESS  5.  Pt will improve BUE coordination to 40" or less in order to manipulate small items during dressing and bathing tasks with no compensatory strategies or AE.   Goal status: IN PROGRESS    ASSESSMENT:  CLINICAL IMPRESSION: This session pt having increased fascial restrictions and pain in BUE, addressed with manual therapy. She then completed AA/ROM on BUE, where she had decreased mobility on the RUE. Requiring redirections often and increased time provided for fatigue and pain. OT Providing verbal and tactile cuing for positioning and technique.   PERFORMANCE DEFICITS: in functional skills including ADLs, IADLs, coordination, dexterity, ROM, strength, pain, fascial restrictions, muscle spasms, Fine motor control, Gross motor control, body mechanics, and UE functional use.   PLAN:  OT FREQUENCY: 1x/week  OT DURATION: 6 weeks  PLANNED INTERVENTIONS: self care/ADL training, therapeutic exercise, therapeutic activity, manual therapy, passive range of motion,  functional mobility training, electrical stimulation, ultrasound, paraffin, moist heat, patient/family education, coping strategies training, and DME and/or AE instructions  RECOMMENDED OTHER SERVICES: PT  CONSULTED AND AGREED WITH PLAN OF CARE: Patient  PLAN FOR NEXT SESSION: Manual Therapy as needed, A/ROM, coordination tasks, gripping tasks, pinching tasks.   Trish Mage, OTR/L [facility] [phone] Byran Bilotti Rosemarie Beath, OT 02/25/2023, 4:56 PM

## 2023-03-02 ENCOUNTER — Inpatient Hospital Stay: Payer: Medicare Other

## 2023-03-02 VITALS — HR 75 | Temp 98.2°F | Resp 18

## 2023-03-02 DIAGNOSIS — D500 Iron deficiency anemia secondary to blood loss (chronic): Secondary | ICD-10-CM

## 2023-03-02 DIAGNOSIS — Z95828 Presence of other vascular implants and grafts: Secondary | ICD-10-CM

## 2023-03-02 LAB — PREPARE RBC (CROSSMATCH)

## 2023-03-02 LAB — CBC
HCT: 20 % — ABNORMAL LOW (ref 36.0–46.0)
Hemoglobin: 6.2 g/dL — CL (ref 12.0–15.0)
MCH: 21.7 pg — ABNORMAL LOW (ref 26.0–34.0)
MCHC: 31 g/dL (ref 30.0–36.0)
MCV: 69.9 fL — ABNORMAL LOW (ref 80.0–100.0)
Platelets: 203 10*3/uL (ref 150–400)
RBC: 2.86 MIL/uL — ABNORMAL LOW (ref 3.87–5.11)
RDW: 23.3 % — ABNORMAL HIGH (ref 11.5–15.5)
WBC: 4.2 10*3/uL (ref 4.0–10.5)
nRBC: 0.7 % — ABNORMAL HIGH (ref 0.0–0.2)

## 2023-03-02 LAB — BPAM RBC
Blood Product Expiration Date: 202407292359
ISSUE DATE / TIME: 202407151558
Unit Type and Rh: 6200

## 2023-03-02 LAB — TYPE AND SCREEN
Unit division: 0
Unit division: 0

## 2023-03-02 MED ORDER — SODIUM CHLORIDE 0.9% FLUSH
10.0000 mL | INTRAVENOUS | Status: DC | PRN
  Administered 2023-03-02: 10 mL via INTRAVENOUS

## 2023-03-02 MED ORDER — SODIUM CHLORIDE 0.9% FLUSH
10.0000 mL | INTRAVENOUS | Status: AC | PRN
  Administered 2023-03-02: 10 mL

## 2023-03-02 MED ORDER — HEPARIN SOD (PORK) LOCK FLUSH 100 UNIT/ML IV SOLN
500.0000 [IU] | Freq: Once | INTRAVENOUS | Status: AC
  Administered 2023-03-02: 500 [IU] via INTRAVENOUS

## 2023-03-02 MED ORDER — ACETAMINOPHEN 325 MG PO TABS
650.0000 mg | ORAL_TABLET | Freq: Once | ORAL | Status: AC
  Administered 2023-03-02: 650 mg via ORAL
  Filled 2023-03-02: qty 2

## 2023-03-02 MED ORDER — HEPARIN SOD (PORK) LOCK FLUSH 100 UNIT/ML IV SOLN
500.0000 [IU] | Freq: Every day | INTRAVENOUS | Status: AC | PRN
  Administered 2023-03-02: 500 [IU]

## 2023-03-02 MED ORDER — SODIUM CHLORIDE 0.9% IV SOLUTION
250.0000 mL | Freq: Once | INTRAVENOUS | Status: AC
  Administered 2023-03-02: 250 mL via INTRAVENOUS

## 2023-03-02 MED ORDER — DIPHENHYDRAMINE HCL 25 MG PO CAPS: 25.0000 mg | ORAL_CAPSULE | Freq: Once | ORAL | Status: DC

## 2023-03-02 NOTE — Patient Instructions (Signed)

## 2023-03-02 NOTE — Progress Notes (Signed)
 Patients port flushed without difficulty.  Good blood return noted with no bruising or swelling noted at site.  Band aid applied.  VSS with discharge and left in satisfactory condition with no s/s of distress noted.   

## 2023-03-02 NOTE — Progress Notes (Signed)
Patient is here today for blood transfusion x 2 per MD.  Dr. Ellin Saba states that she does not have to have irradiated blood.  Blood bank updated and blood is ready for transfusion.  Patient reports feeling tired but overall doing well.  Port accessed without incidence. Patient refused to take Benadryl because she doesn't want to be sleepy rest of the day.  Dr. Ellin Saba is aware and okay with holding Benadryl at this time.  Patient will take tylenol.

## 2023-03-02 NOTE — Progress Notes (Signed)
Critical value alert:   Hgb 6.2  Per Dr. Ellin Saba, follow standing orders.  Transfuse 2 units PRBC today.   Patient aware and on her way back to the clinic for infusion.

## 2023-03-02 NOTE — Progress Notes (Signed)
Patient tolerated transfusion well with no complaints voiced.  Patient left via wheelchair in stable condition.  Vital signs stable at discharge.  Follow up as scheduled.

## 2023-03-02 NOTE — Addendum Note (Signed)
Addended by: Mickie Bail on: 03/02/2023 12:05 PM   Modules accepted: Orders

## 2023-03-02 NOTE — Progress Notes (Signed)
Retacrit treatment plan discontinued. Patient had adverse side effects. Refuses future Retacrit.  Anola Gurney [place], [place], BCPS, BCOP 03/02/2023 1:49 PM

## 2023-03-03 ENCOUNTER — Ambulatory Visit: Payer: Medicare Other

## 2023-03-03 LAB — TYPE AND SCREEN
ABO/RH(D): A POS
Antibody Screen: NEGATIVE

## 2023-03-03 LAB — BPAM RBC
Blood Product Expiration Date: 202407292359
ISSUE DATE / TIME: 202407151413
Unit Type and Rh: 6200

## 2023-03-04 ENCOUNTER — Encounter (HOSPITAL_COMMUNITY): Payer: 59 | Admitting: Occupational Therapy

## 2023-03-05 ENCOUNTER — Other Ambulatory Visit: Payer: Self-pay

## 2023-03-05 ENCOUNTER — Ambulatory Visit (HOSPITAL_COMMUNITY): Payer: Medicare Other | Admitting: Occupational Therapy

## 2023-03-05 DIAGNOSIS — Z95828 Presence of other vascular implants and grafts: Secondary | ICD-10-CM

## 2023-03-05 DIAGNOSIS — D500 Iron deficiency anemia secondary to blood loss (chronic): Secondary | ICD-10-CM

## 2023-03-06 ENCOUNTER — Other Ambulatory Visit: Payer: Self-pay

## 2023-03-06 ENCOUNTER — Ambulatory Visit (HOSPITAL_COMMUNITY): Payer: Medicare Other | Admitting: Physical Therapy

## 2023-03-06 ENCOUNTER — Encounter (HOSPITAL_COMMUNITY): Payer: Self-pay | Admitting: Physical Therapy

## 2023-03-06 ENCOUNTER — Telehealth (HOSPITAL_COMMUNITY): Payer: Self-pay | Admitting: Occupational Therapy

## 2023-03-06 DIAGNOSIS — R2689 Other abnormalities of gait and mobility: Secondary | ICD-10-CM

## 2023-03-06 DIAGNOSIS — K7682 Hepatic encephalopathy: Secondary | ICD-10-CM

## 2023-03-06 DIAGNOSIS — R278 Other lack of coordination: Secondary | ICD-10-CM | POA: Diagnosis not present

## 2023-03-06 DIAGNOSIS — M6281 Muscle weakness (generalized): Secondary | ICD-10-CM

## 2023-03-06 NOTE — Telephone Encounter (Signed)
This OT spoke with patient regarding her missed appointment. Pt reported that she had another appointment at the same time and thought that this one was cancelled. OT provided reminder for next appointment, with new schedule printed.   Trish Mage, OTR/L [facility] [phone]

## 2023-03-06 NOTE — Therapy (Signed)
OUTPATIENT PHYSICAL THERAPY LOWER EXTREMITY EVALUATION   Patient Name: Rachel Huber MRN: [id number] DOB:06/14/1956, 67 y.o., female Today's Date: 03/06/2023  END OF SESSION:  PT End of Session - 03/06/23 1252     Visit Number 1    Number of Visits 16   1-2x/week for 8 weeks   Date for PT Re-Evaluation 05/01/23    Authorization Type Medicare A/ Secondary: [employer]    Progress Note Due on Visit 10    PT Start Time 1255    PT Stop Time 1342    PT Time Calculation (min) 47 min    Activity Tolerance Patient tolerated treatment well    Behavior During Therapy WFL for tasks assessed/performed             Past Medical History:  Diagnosis Date   Anemia in chronic kidney disease (CKD) 02/07/2022   Anxiety    Arthritis    Ascites of liver    Weekly paracentesis   Cirrhosis of liver (HCC)    CKD (chronic kidney disease) stage 4, GFR 15-29 ml/min (HCC)    GERD (gastroesophageal reflux disease)    Grade IV internal hemorrhoids    Hypertension    NASH (nonalcoholic steatohepatitis)    Neuropathy, diabetic (HCC)    Obesity (BMI 30-39.9)    OSA on CPAP    Pneumonia    Portal hypertension (HCC)    S/P TIPS (transjugular intrahepatic portosystemic shunt)    Thalassemia minor 1992   Past Surgical History:  Procedure Laterality Date   ABDOMINAL HYSTERECTOMY     BREAST BIOPSY Right 02/15/2018   [place] bx 6-6:30 ribbon shape, ONE CORE FRAGMENT WITH FIBROSIS. ONE CORE FRAGMENT WITH PORTION OF A DILATED   BREAST BIOPSY Right 02/15/2018   [place] bx 9:00 heart shape, USUAL DUCTAL HYPERPLASIA   BREAST LUMPECTOMY Right 03/09/2018   Procedure: BREAST LUMPECTOMY x 2;  Surgeon: Sung Amabile, DO;  Location: [facility];  Service: General;  Laterality: Right;   CATARACT EXTRACTION W/PHACO Left 06/11/2016   Procedure: CATARACT EXTRACTION PHACO AND INTRAOCULAR LENS PLACEMENT (IOC);  Surgeon: Sallee Lange, MD;  Location: [facility];  Service: Ophthalmology;  Laterality: Left;  Lot #  [id number] H US:01:38.6 AP%:26.4 CDE:44.15   CATARACT EXTRACTION W/PHACO Right 06/15/2018   Procedure: CATARACT EXTRACTION PHACO AND INTRAOCULAR LENS PLACEMENT (IOC);  Surgeon: Galen Manila, MD;  Location: [facility];  Service: Ophthalmology;  Laterality: Right;  [place] 00:38.2 CDE 4.23 Fluid Pack Lot # [id number]   COLONOSCOPIES     COLONOSCOPY WITH PROPOFOL N/A 10/10/2020   Procedure: COLONOSCOPY WITH PROPOFOL;  Surgeon: Regis Bill, MD;  Location: [facility];  Service: Endoscopy;  Laterality: N/A;   COLONOSCOPY WITH PROPOFOL N/A 11/20/2020   Procedure: COLONOSCOPY WITH PROPOFOL;  Surgeon: Regis Bill, MD;  Location: [facility];  Service: Endoscopy;  Laterality: N/A;  DM STAT CBC, BMP COVID POSITIVE 09/02/2020   COLONOSCOPY WITH PROPOFOL N/A 06/19/2022   Procedure: COLONOSCOPY WITH PROPOFOL;  Surgeon: Regis Bill, MD;  Location: [facility];  Service: Endoscopy;  Laterality: N/A;   CTR     ESOPHAGOGASTRODUODENOSCOPY (EGD) WITH PROPOFOL N/A 10/10/2020   Procedure: ESOPHAGOGASTRODUODENOSCOPY (EGD) WITH PROPOFOL;  Surgeon: Regis Bill, MD;  Location: [facility] END[facility];  Service: Endoscopy;  Laterality: N/A;  COVID POSITIVE 10/08/2020   FLEXIBLE SIGMOIDOSCOPY N/A 02/06/2022   Procedure: FLEXIBLE SIGMOIDOSCOPY;  Surgeon: Regis Bill, MD;  Location: [facility];  Service: Endoscopy;  Laterality: N/A;  Patient requests anesthesia   FLEXIBLE SIGMOIDOSCOPY N/A 12/19/2022  Procedure: FLEXIBLE SIGMOIDOSCOPY;  Surgeon: Marguerita Merles, Reuel Boom, MD;  Location: [facility];  Service: Gastroenterology;  Laterality: N/A;   IR ANGIOGRAM SELECTIVE EACH ADDITIONAL VESSEL  02/07/2022   IR ANGIOGRAM SELECTIVE EACH ADDITIONAL VESSEL  02/07/2022   IR ANGIOGRAM SELECTIVE EACH ADDITIONAL VESSEL  09/05/2022   IR ANGIOGRAM VISCERAL SELECTIVE  02/07/2022   IR ANGIOGRAM VISCERAL SELECTIVE  09/05/2022   IR CV LINE INJECTION  02/24/2023   IR EMBO ART  VEN HEMORR LYMPH EXTRAV  INC  GUIDE ROADMAPPING  09/05/2022   IR EMBO ART  VEN HEMORR LYMPH EXTRAV  INC GUIDE ROADMAPPING  11/05/2022   IR EMBO ARTERIAL NOT HEMORR HEMANG INC GUIDE ROADMAPPING  02/07/2022   IR EMBO TUMOR ORGAN ISCHEMIA INFARCT INC GUIDE ROADMAPPING  01/06/2023   IR IMAGING GUIDED PORT INSERTION  06/13/2022   IR IMAGING GUIDED PORT INSERTION  11/25/2022   IR PARACENTESIS  12/17/2021   IR PARACENTESIS  03/25/2022   IR PARACENTESIS  11/25/2022   IR PARACENTESIS  01/06/2023   IR PARACENTESIS  01/19/2023   IR RADIOLOGIST EVAL & MGMT  03/18/2022   IR RADIOLOGIST EVAL & MGMT  05/07/2022   IR RADIOLOGIST EVAL & MGMT  05/16/2022   IR RADIOLOGIST EVAL & MGMT  10/13/2022   IR RADIOLOGIST EVAL & MGMT  12/12/2022   IR US GUIDE VASC ACCESS RIGHT  02/07/2022   IR US GUIDE VASC ACCESS RIGHT  09/05/2022   JOINT REPLACEMENT     KNEE SURGERY Right 09/24/2017   plates and pins   PORT-A-CATH REMOVAL N/A 09/23/2022   Procedure: MINOR REMOVAL PORT-A-CATH;  Surgeon: Franky Macho, MD;  Location: [facility];  Service: General;  Laterality: N/A;   TEE WITHOUT CARDIOVERSION N/A 09/23/2022   Procedure: TRANSESOPHAGEAL ECHOCARDIOGRAM (TEE);  Surgeon: Antoine Poche, MD;  Location: [facility];  Service: Endoscopy;  Laterality: N/A;   TOTAL SHOULDER ARTHROPLASTY Right 09/27/2015   Patient Active Problem List   Diagnosis Date Noted   Acute hepatic encephalopathy (HCC) 01/19/2023   Pancytopenia (HCC) 01/06/2023   Anemia 01/06/2023   Internal hemorrhoid 01/06/2023   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   Symptomatic anemia 12/17/2022   Hypokalemia 11/06/2022   Acute on chronic anemia 11/03/2022   Hypotension 11/03/2022   AKI (acute kidney injury) (HCC) 10/15/2022   Rectal bleeding 06/17/2022   Obesity (BMI 30-39.9) 06/17/2022   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 mellitus (HCC) 06/17/2022   Grade IV internal hemorrhoids 05/16/2022   Thrombocytopenia (HCC) 03/05/2022   Anemia in chronic kidney disease (CKD) 02/07/2022   GI bleeding 02/04/2022   Chronic  kidney disease, stage 3b (HCC) 02/04/2022   Depression with anxiety 02/04/2022   Liver cirrhosis secondary to NASH (HCC)    Insomnia 01/27/2022   Iron deficiency anemia due to chronic blood loss 02/13/2021   History of uterine cancer 02/13/2021   Decompensated hepatic cirrhosis (HCC) 10/25/2020   OSA on CPAP 10/25/2020   Acute respiratory failure with hypoxia (HCC) 09/10/2020   Closed right hip fracture (HCC) 09/02/2020   Type 2 diabetes mellitus with hyperlipidemia (HCC) 09/02/2020   Thalassemia minor    HLD (hyperlipidemia)    Acute kidney injury superimposed on CKD (HCC)    Depression     PCP: Einar Crow MD   REFERRING PROVIDER: Mariam Dollar PA-C  REFERRING DIAG: [phone] (ICD-10-CM) - Hepatic encephalopathy  THERAPY DIAG:  Muscle weakness (generalized)  Other abnormalities of gait and mobility  Acute hepatic encephalopathy (HCC)  Rationale for Evaluation and Treatment: Rehabilitation  ONSET DATE: 01/06/23  SUBJECTIVE:   SUBJECTIVE STATEMENT: Patient states continued weakness but not as bad as it was last time she was here. Leg swelling was down when she left the hospital and then went back up and is now going down again. She uses the cane around the house. Uses rollator with increased distance. Was weak in the hospital.   PERTINENT HISTORY: Falls, weakness, R knee pain, Cirrhosis, L foot ulcer, CKD, neuropathy PAIN:  Are you having pain? No  PRECAUTIONS: Fall  WEIGHT BEARING RESTRICTIONS: No  FALLS:  Has patient fallen in last 6 months? No  LIVING ENVIRONMENT: Lives with: lives with their spouse Lives in: House/apartment Stairs: No Has following equipment at home: Single point cane and Environmental consultant - 2 wheeled  OCCUPATION: retired/disability  PLOF: Independent with household mobility with device and Needs assistance with ADLs  PATIENT GOALS: to get stronger   OBJECTIVE:   COGNITION: Overall cognitive status: Within functional limits for tasks  assessed     SENSATION: WFL   POSTURE: rounded shoulders and forward head  PALPATION: Bilateral LE edema  LOWER EXTREMITY ROM:  Active ROM Right eval Left eval  Hip flexion    Hip extension    Hip abduction    Hip adduction    Hip internal rotation    Hip external rotation    Knee flexion    Knee extension    Ankle dorsiflexion    Ankle plantarflexion    Ankle inversion    Ankle eversion     (Blank rows = not tested)  LOWER EXTREMITY MMT:  MMT Right eval Left eval  Hip flexion 4+ 4+  Hip extension    Hip abduction    Hip adduction    Hip internal rotation    Hip external rotation    Knee flexion 4+ 4+  Knee extension 4+ 4+  Ankle dorsiflexion 5 5  Ankle plantarflexion    Ankle inversion    Ankle eversion     (Blank rows = not tested)    FUNCTIONAL TESTS:  2 minute walk test: 150 feet with rollator Dynamic Gait Index: 13/24  GAIT: Distance walked: 150 feet Assistive device utilized: Walker - 4 wheeled Level of assistance: Modified independence Comments:  DGI 1. Gait level surface (2) Mild Impairment: Walks 20', uses assistive devices, slower speed, mild gait deviations. 2. Change in gait speed (2) Mild Impairment: Is able to change speed but demonstrates mild gait deviations, or not gait deviations but unable to achieve a significant change in velocity, or uses an assistive device. 3. Gait with horizontal head turns (1) Moderate Impairment: Performs head turns with moderate change in gait velocity, slows down, staggers but recovers, can continue to walk. 4. Gait with vertical head turns 1) Moderate Impairment: Performs head turns with moderate change in gait velocity, slows down, staggers but recovers, can continue to walk. 5. Gait and pivot turn (2) Mild Impairment: Pivot turns safely in > 3 seconds and stops with no loss of balance. 6. Step over obstacle (1) Moderate Impairment: Is able to step over box but must stop, then step over. May  require verbal cueing. 7. Step around obstacles (2) Mild Impairment: Is able to step around both cones, but must slow down and adjust steps to clear cones. 8. Stairs (1) Moderate Impairment: Two feet to a stair, must use rail.  TOTAL SCORE: 13 / 24   TODAY'S TREATMENT:  DATE:  03/06/23 EVAL and HEP    PATIENT EDUCATION:  Education details: Patient educated on exam findings, POC, scope of PT, HEP. Person educated: Patient Education method: Explanation, Demonstration, and Handouts Education comprehension: verbalized understanding, returned demonstration, verbal cues required, and tactile cues required  HOME EXERCISE PROGRAM: Access Code: [email or web address] URL: [email or web address]  Date: 03/06/2023 - Seated Heel Toe Raises  - 2 x daily - 7 x weekly - 1 sets - 10 reps - Seated Long Arc Quad  - 2 x daily - 7 x weekly - 1 sets - 10 reps - Seated March  - 2 x daily - 7 x weekly - 1 sets - 10 reps - Supine Active Straight Leg Raise  - 7 x weekly - 1 sets - 10 reps - Heel Raises with Counter Support  - 2 x daily - 7 x weekly - 1 sets - 10 reps - 3-5" hold - Mini Squat with Counter Support  - 2 x daily - 7 x weekly - 1 sets - 10 reps - 3-5" hold - Sit to Stand with Counter Support  - 2 x daily - 7 x weekly - 1 sets - 10 reps  ASSESSMENT:  CLINICAL IMPRESSION: Patient a 67 y.o. y.o. female who was seen today for physical therapy evaluation and treatment for weakness. Patient presents with deficits in bilateral LE strength, ROM, endurance, activity tolerance, and functional mobility with ADL. Patient is having to modify and restrict ADL as indicated by outcome measure score as well as subjective information and objective measures which is affecting overall participation. Patient will benefit from skilled physical therapy in order to improve function and reduce  impairment.  OBJECTIVE IMPAIRMENTS: Abnormal gait, decreased activity tolerance, decreased balance, decreased endurance, decreased mobility, difficulty walking, decreased strength, and improper body mechanics.   ACTIVITY LIMITATIONS: carrying, lifting, bending, standing, squatting, stairs, transfers, bed mobility, locomotion level, and caring for others  PARTICIPATION LIMITATIONS: meal prep, cleaning, laundry, shopping, community activity, and yard work  PERSONAL FACTORS: Time since onset of injury/illness/exacerbation and 3+ comorbidities: Falls, weakness, R knee pain, Cirrhosis, L foot ulcer  are also affecting patient's functional outcome.   REHAB POTENTIAL: Good  CLINICAL DECISION MAKING: Stable/uncomplicated  EVALUATION COMPLEXITY: Low   GOALS: Goals reviewed with patient? Yes  SHORT TERM GOALS: Target date: 04/03/2023    Patient will be independent with HEP in order to improve functional outcomes. Baseline: Goal status: INITIAL  2.  Patient will report at least 25% improvement in symptoms for improved quality of life. Baseline: Goal status: INITIAL   LONG TERM GOALS: Target date: 05/01/2023    Patient will report at least 75% improvement in symptoms for improved quality of life. Baseline:  Goal status: INITIAL  2.  Patient will improve DGI score by at least 7 points in order to indicate improved balance for community ambulation. Baseline: 13/24 Goal status: INITIAL  3.  Patient will demonstrate grade of 5/5 MMT grade in all tested musculature as evidence of improved strength to assist with stair ambulation and gait.   Baseline: see above Goal status: INITIAL  4.  Patient will be able to ambulate at least 226 feet in with LRAD in order to demonstrate improved tolerance to activity. Baseline: 150 with rollator Goal status: INITIAL     PLAN:  PT FREQUENCY: 1-2x/week  PT DURATION: 8 weeks  PLANNED INTERVENTIONS: Therapeutic exercises, Therapeutic  activity, Neuromuscular re-education, Balance training, Gait training, Patient/Family education, Joint manipulation, Joint mobilization, Stair training, Orthotic/Fit training, DME instructions,  Aquatic Therapy, Dry Needling, Electrical stimulation, Spinal manipulation, Spinal mobilization, Cryotherapy, Moist heat, Compression bandaging, scar mobilization, Splintting, Taping, Traction, Ultrasound, Ionotophoresis 4mg /ml Dexamethasone, and Manual therapy  PLAN FOR NEXT SESSION: functional strength, endurance, test glute strength, balance training   Reola Mosher [place] Pechacek, PT 03/06/2023, 1:46 PM

## 2023-03-09 ENCOUNTER — Inpatient Hospital Stay: Payer: Medicare Other

## 2023-03-09 DIAGNOSIS — D500 Iron deficiency anemia secondary to blood loss (chronic): Secondary | ICD-10-CM

## 2023-03-09 LAB — SAMPLE TO BLOOD BANK

## 2023-03-09 LAB — CBC
HCT: 23.2 % — ABNORMAL LOW (ref 36.0–46.0)
Hemoglobin: 7.2 g/dL — ABNORMAL LOW (ref 12.0–15.0)
MCH: 22.8 pg — ABNORMAL LOW (ref 26.0–34.0)
MCHC: 31 g/dL (ref 30.0–36.0)
MCV: 73.4 fL — ABNORMAL LOW (ref 80.0–100.0)
Platelets: 143 10*3/uL — ABNORMAL LOW (ref 150–400)
RBC: 3.16 MIL/uL — ABNORMAL LOW (ref 3.87–5.11)
RDW: 24.1 % — ABNORMAL HIGH (ref 11.5–15.5)
WBC: 3.5 10*3/uL — ABNORMAL LOW (ref 4.0–10.5)
nRBC: 0 % (ref 0.0–0.2)

## 2023-03-09 MED ORDER — HEPARIN SOD (PORK) LOCK FLUSH 100 UNIT/ML IV SOLN
500.0000 [IU] | Freq: Once | INTRAVENOUS | Status: AC
  Administered 2023-03-09: 500 [IU] via INTRAVENOUS

## 2023-03-09 MED ORDER — SODIUM CHLORIDE 0.9% FLUSH
10.0000 mL | Freq: Once | INTRAVENOUS | Status: AC
  Administered 2023-03-09: 10 mL via INTRAVENOUS

## 2023-03-09 NOTE — Progress Notes (Signed)
Patients port flushed without difficulty.  Good blood return noted with no bruising or swelling noted at site.  Band aid applied.  VSS with discharge and left in satisfactory condition with no s/s of distress noted.   

## 2023-03-09 NOTE — Patient Instructions (Signed)

## 2023-03-10 ENCOUNTER — Ambulatory Visit
Admission: RE | Admit: 2023-03-10 | Discharge: 2023-03-10 | Disposition: A | Discharge status: Home / Self Care | Payer: Medicare Other | Source: Ambulatory Visit | Attending: Gastroenterology | Admitting: Gastroenterology

## 2023-03-10 DIAGNOSIS — K7460 Unspecified cirrhosis of liver: Secondary | ICD-10-CM | POA: Diagnosis present

## 2023-03-10 DIAGNOSIS — K7581 Nonalcoholic steatohepatitis (NASH): Secondary | ICD-10-CM | POA: Insufficient documentation

## 2023-03-10 MED ORDER — ALBUMIN HUMAN 25 % IV SOLN: 25.0000 g | Freq: Once | INTRAVENOUS | Status: DC

## 2023-03-10 MED ORDER — ALBUMIN HUMAN 25 % IV SOLN
INTRAVENOUS | Status: AC
  Filled 2023-03-10: qty 100

## 2023-03-10 MED ORDER — LIDOCAINE HCL (PF) 1 % IJ SOLN
10.0000 mL | Freq: Once | INTRAMUSCULAR | Status: AC
  Administered 2023-03-10: 10 mL via INTRADERMAL

## 2023-03-10 MED ORDER — HEPARIN SOD (PORK) LOCK FLUSH 100 UNIT/ML IV SOLN
500.0000 [IU] | Freq: Once | INTRAVENOUS | Status: AC
  Administered 2023-03-10: 500 [IU] via INTRAVENOUS

## 2023-03-10 MED ORDER — HEPARIN SOD (PORK) LOCK FLUSH 100 UNIT/ML IV SOLN
INTRAVENOUS | Status: AC
  Filled 2023-03-10: qty 5

## 2023-03-12 ENCOUNTER — Ambulatory Visit (HOSPITAL_COMMUNITY): Payer: Medicare Other | Admitting: Occupational Therapy

## 2023-03-16 ENCOUNTER — Inpatient Hospital Stay: Payer: Medicare Other

## 2023-03-16 DIAGNOSIS — D561 Beta thalassemia: Secondary | ICD-10-CM

## 2023-03-16 DIAGNOSIS — D61818 Other pancytopenia: Secondary | ICD-10-CM

## 2023-03-16 DIAGNOSIS — D500 Iron deficiency anemia secondary to blood loss (chronic): Secondary | ICD-10-CM

## 2023-03-16 DIAGNOSIS — N1832 Chronic kidney disease, stage 3b: Secondary | ICD-10-CM

## 2023-03-16 LAB — CBC WITH DIFFERENTIAL/PLATELET
Abs Immature Granulocytes: 0 10*3/uL (ref 0.00–0.07)
Basophils Absolute: 0 10*3/uL (ref 0.0–0.1)
Basophils Relative: 1 %
Eosinophils Absolute: 0.3 10*3/uL (ref 0.0–0.5)
Eosinophils Relative: 6 %
HCT: 21.7 % — ABNORMAL LOW (ref 36.0–46.0)
Hemoglobin: 6.7 g/dL — CL (ref 12.0–15.0)
Immature Granulocytes: 0 %
Lymphocytes Relative: 18 %
Lymphs Abs: 0.8 10*3/uL (ref 0.7–4.0)
MCH: 22.1 pg — ABNORMAL LOW (ref 26.0–34.0)
MCHC: 30.9 g/dL (ref 30.0–36.0)
MCV: 71.6 fL — ABNORMAL LOW (ref 80.0–100.0)
Monocytes Absolute: 0.6 10*3/uL (ref 0.1–1.0)
Monocytes Relative: 14 %
Neutro Abs: 2.7 10*3/uL (ref 1.7–7.7)
Neutrophils Relative %: 61 %
Platelets: 209 10*3/uL (ref 150–400)
RBC: 3.03 MIL/uL — ABNORMAL LOW (ref 3.87–5.11)
RDW: 23.5 % — ABNORMAL HIGH (ref 11.5–15.5)
WBC: 4.4 10*3/uL (ref 4.0–10.5)
nRBC: 0 % (ref 0.0–0.2)

## 2023-03-16 LAB — TYPE AND SCREEN
ABO/RH(D): A POS
Antibody Screen: NEGATIVE
Unit division: 0
Unit division: 0

## 2023-03-16 LAB — BPAM RBC
Blood Product Expiration Date: 202408302359
Blood Product Expiration Date: 202408302359
Unit Type and Rh: 6200
Unit Type and Rh: 6200

## 2023-03-16 LAB — SAMPLE TO BLOOD BANK

## 2023-03-16 LAB — PREPARE RBC (CROSSMATCH)

## 2023-03-16 MED ORDER — SODIUM CHLORIDE 0.9% FLUSH
10.0000 mL | INTRAVENOUS | Status: AC
  Administered 2023-03-16: 10 mL

## 2023-03-16 MED ORDER — HEPARIN SOD (PORK) LOCK FLUSH 100 UNIT/ML IV SOLN
500.0000 [IU] | Freq: Once | INTRAVENOUS | Status: AC
  Administered 2023-03-16: 500 [IU] via INTRAVENOUS

## 2023-03-16 NOTE — Progress Notes (Signed)
Critical value alert:   Hgb 6.7  Dr. Ellin Saba aware.  Per standing orders, we will transfuse 2 units PRBC.    Patient aware of her appt time.  Charge RN aware of add on.

## 2023-03-16 NOTE — Progress Notes (Signed)
Don Broach presented for Portacath access and flush.  Portacath located right chest wall accessed with  H 20 needle.  Good blood return present. Portacath flushed with 20ml NS and 500U/27ml Heparin and needle removed intact.  Procedure tolerated well and without incident.  Vital signs stable. No complaints at this time. Discharged from clinic by wheel chair in stable condition. Alert and oriented x 3. F/U with [facility] as scheduled.

## 2023-03-17 ENCOUNTER — Ambulatory Visit
Admission: RE | Admit: 2023-03-17 | Discharge: 2023-03-17 | Disposition: A | Discharge status: Home / Self Care | Payer: Medicare Other | Source: Ambulatory Visit | Attending: Gastroenterology | Admitting: Gastroenterology

## 2023-03-17 DIAGNOSIS — K7581 Nonalcoholic steatohepatitis (NASH): Secondary | ICD-10-CM | POA: Insufficient documentation

## 2023-03-17 DIAGNOSIS — K7460 Unspecified cirrhosis of liver: Secondary | ICD-10-CM | POA: Diagnosis present

## 2023-03-17 MED ORDER — LIDOCAINE HCL (PF) 1 % IJ SOLN
30.0000 mL | Freq: Once | INTRAMUSCULAR | Status: AC
  Administered 2023-03-17: 30 mL via SUBCUTANEOUS

## 2023-03-17 NOTE — Procedures (Signed)
PROCEDURE SUMMARY:  Successful US guided paracentesis from right abdomen.  Yielded 2.65 L of clear yellow fluid.  No immediate complications.  Pt tolerated well.   Specimen not sent for labs.  EBL < 2 mL  Mickie Kay, NP 03/17/2023 1:20 PM

## 2023-03-18 ENCOUNTER — Other Ambulatory Visit (HOSPITAL_COMMUNITY): Payer: 59

## 2023-03-18 ENCOUNTER — Ambulatory Visit
Admission: RE | Admit: 2023-03-18 | Discharge: 2023-03-18 | Disposition: A | Discharge status: Home / Self Care | Payer: Medicare Other | Source: Ambulatory Visit | Attending: Interventional Radiology | Admitting: Interventional Radiology

## 2023-03-18 ENCOUNTER — Telehealth: Payer: Self-pay | Admitting: *Deleted

## 2023-03-18 DIAGNOSIS — D7389 Other diseases of spleen: Secondary | ICD-10-CM | POA: Diagnosis present

## 2023-03-18 DIAGNOSIS — K7460 Unspecified cirrhosis of liver: Secondary | ICD-10-CM | POA: Insufficient documentation

## 2023-03-18 DIAGNOSIS — K7581 Nonalcoholic steatohepatitis (NASH): Secondary | ICD-10-CM | POA: Insufficient documentation

## 2023-03-18 MED ORDER — HEPARIN SOD (PORK) LOCK FLUSH 100 UNIT/ML IV SOLN
INTRAVENOUS | Status: AC
  Filled 2023-03-18: qty 5

## 2023-03-18 MED ORDER — HEPARIN SOD (PORK) LOCK FLUSH 100 UNIT/ML IV SOLN
500.0000 [IU] | Freq: Once | INTRAVENOUS | Status: AC
  Administered 2023-03-18: 500 [IU] via INTRAVENOUS
  Filled 2023-03-18: qty 5

## 2023-03-18 MED ORDER — IOHEXOL 350 MG/ML SOLN
75.0000 mL | Freq: Once | INTRAVENOUS | Status: AC | PRN
  Administered 2023-03-18: 75 mL via INTRAVENOUS

## 2023-03-18 NOTE — Telephone Encounter (Signed)
Patient was scheduled to come in for 2 units of blood at 9:30 this morning, and was made aware Monday that although her appointment time was 10:00 in [facility], to take her benadryl and tylenol this morning and arrive at 9:30 so that her transfusion would be finished in time for her CT this afternoon. She recalls the conversation. She states that her husband is not awake and she does not want to come today and requested to come for repeat labs on Monday.  Attempted to encourage her to come today, as planned, however she declined.  Patient aware of symptoms of critical hemoglobin and to report to the ER if any of these occur between now and Monday.

## 2023-03-19 ENCOUNTER — Ambulatory Visit (HOSPITAL_COMMUNITY): Payer: Medicare Other | Attending: Gastroenterology | Admitting: Occupational Therapy

## 2023-03-19 ENCOUNTER — Encounter (HOSPITAL_COMMUNITY): Payer: Self-pay | Admitting: Occupational Therapy

## 2023-03-19 DIAGNOSIS — R2689 Other abnormalities of gait and mobility: Secondary | ICD-10-CM | POA: Diagnosis present

## 2023-03-19 DIAGNOSIS — R278 Other lack of coordination: Secondary | ICD-10-CM | POA: Insufficient documentation

## 2023-03-19 DIAGNOSIS — K7682 Hepatic encephalopathy: Secondary | ICD-10-CM | POA: Diagnosis present

## 2023-03-19 DIAGNOSIS — R29818 Other symptoms and signs involving the nervous system: Secondary | ICD-10-CM | POA: Diagnosis present

## 2023-03-19 DIAGNOSIS — M6281 Muscle weakness (generalized): Secondary | ICD-10-CM | POA: Diagnosis present

## 2023-03-19 NOTE — Therapy (Signed)
OUTPATIENT OCCUPATIONAL THERAPY NEURO TREATMENT NOTE  Patient Name: JADELYN ELKS MRN: [id number] DOB:1956-02-18, 67 y.o., female Today's Date: 03/20/2023  PCP: Einar Crow, MD REFERRING PROVIDER: Cruzita Lederer, PA-C  END OF SESSION:  OT End of Session - 03/19/23 1345     Visit Number 3    Number of Visits 7    Date for OT Re-Evaluation 04/03/23    Authorization Type Medicare Part A and B    OT Start Time 1307    OT Stop Time 1355    OT Time Calculation (min) 48 min    Activity Tolerance Patient tolerated treatment well    Behavior During Therapy WFL for tasks assessed/performed            Past Medical History:  Diagnosis Date   Anemia in chronic kidney disease (CKD) 02/07/2022   Anxiety    Arthritis    Ascites of liver    Weekly paracentesis   Cirrhosis of liver (HCC)    CKD (chronic kidney disease) stage 4, GFR 15-29 ml/min (HCC)    GERD (gastroesophageal reflux disease)    Grade IV internal hemorrhoids    Hypertension    NASH (nonalcoholic steatohepatitis)    Neuropathy, diabetic (HCC)    Obesity (BMI 30-39.9)    OSA on CPAP    Pneumonia    Portal hypertension (HCC)    S/P TIPS (transjugular intrahepatic portosystemic shunt)    Thalassemia minor 1992   Past Surgical History:  Procedure Laterality Date   ABDOMINAL HYSTERECTOMY     BREAST BIOPSY Right 02/15/2018   [place] bx 6-6:30 ribbon shape, ONE CORE FRAGMENT WITH FIBROSIS. ONE CORE FRAGMENT WITH PORTION OF A DILATED   BREAST BIOPSY Right 02/15/2018   [place] bx 9:00 heart shape, USUAL DUCTAL HYPERPLASIA   BREAST LUMPECTOMY Right 03/09/2018   Procedure: BREAST LUMPECTOMY x 2;  Surgeon: Sung Amabile, DO;  Location: [facility];  Service: General;  Laterality: Right;   CATARACT EXTRACTION W/PHACO Left 06/11/2016   Procedure: CATARACT EXTRACTION PHACO AND INTRAOCULAR LENS PLACEMENT (IOC);  Surgeon: Sallee Lange, MD;  Location: [facility];  Service: Ophthalmology;  Laterality: Left;  Lot #  [id number] H US:01:38.6 AP%:26.4 CDE:44.15   CATARACT EXTRACTION W/PHACO Right 06/15/2018   Procedure: CATARACT EXTRACTION PHACO AND INTRAOCULAR LENS PLACEMENT (IOC);  Surgeon: Galen Manila, MD;  Location: [facility];  Service: Ophthalmology;  Laterality: Right;  [place] 00:38.2 CDE 4.23 Fluid Pack Lot # [id number]   COLONOSCOPIES     COLONOSCOPY WITH PROPOFOL N/A 10/10/2020   Procedure: COLONOSCOPY WITH PROPOFOL;  Surgeon: Regis Bill, MD;  Location: [facility];  Service: Endoscopy;  Laterality: N/A;   COLONOSCOPY WITH PROPOFOL N/A 11/20/2020   Procedure: COLONOSCOPY WITH PROPOFOL;  Surgeon: Regis Bill, MD;  Location: [facility];  Service: Endoscopy;  Laterality: N/A;  DM STAT CBC, BMP COVID POSITIVE 09/02/2020   COLONOSCOPY WITH PROPOFOL N/A 06/19/2022   Procedure: COLONOSCOPY WITH PROPOFOL;  Surgeon: Regis Bill, MD;  Location: [facility];  Service: Endoscopy;  Laterality: N/A;   CTR     ESOPHAGOGASTRODUODENOSCOPY (EGD) WITH PROPOFOL N/A 10/10/2020   Procedure: ESOPHAGOGASTRODUODENOSCOPY (EGD) WITH PROPOFOL;  Surgeon: Regis Bill, MD;  Location: [facility];  Service: Endoscopy;  Laterality: N/A;  COVID POSITIVE 10/08/2020   FLEXIBLE SIGMOIDOSCOPY N/A 02/06/2022   Procedure: FLEXIBLE SIGMOIDOSCOPY;  Surgeon: Regis Bill, MD;  Location: [facility];  Service: Endoscopy;  Laterality: N/A;  Patient requests anesthesia   FLEXIBLE SIGMOIDOSCOPY N/A 12/19/2022   Procedure: FLEXIBLE SIGMOIDOSCOPY;  Surgeon:  Dolores Frame, MD;  Location: [facility];  Service: Gastroenterology;  Laterality: N/A;   IR ANGIOGRAM SELECTIVE EACH ADDITIONAL VESSEL  02/07/2022   IR ANGIOGRAM SELECTIVE EACH ADDITIONAL VESSEL  02/07/2022   IR ANGIOGRAM SELECTIVE EACH ADDITIONAL VESSEL  09/05/2022   IR ANGIOGRAM VISCERAL SELECTIVE  02/07/2022   IR ANGIOGRAM VISCERAL SELECTIVE  09/05/2022   IR CV LINE INJECTION  02/24/2023   IR EMBO ART  VEN HEMORR LYMPH EXTRAV  INC  GUIDE ROADMAPPING  09/05/2022   IR EMBO ART  VEN HEMORR LYMPH EXTRAV  INC GUIDE ROADMAPPING  11/05/2022   IR EMBO ARTERIAL NOT HEMORR HEMANG INC GUIDE ROADMAPPING  02/07/2022   IR EMBO TUMOR ORGAN ISCHEMIA INFARCT INC GUIDE ROADMAPPING  01/06/2023   IR IMAGING GUIDED PORT INSERTION  06/13/2022   IR IMAGING GUIDED PORT INSERTION  11/25/2022   IR PARACENTESIS  12/17/2021   IR PARACENTESIS  03/25/2022   IR PARACENTESIS  11/25/2022   IR PARACENTESIS  01/06/2023   IR PARACENTESIS  01/19/2023   IR RADIOLOGIST EVAL & MGMT  03/18/2022   IR RADIOLOGIST EVAL & MGMT  05/07/2022   IR RADIOLOGIST EVAL & MGMT  05/16/2022   IR RADIOLOGIST EVAL & MGMT  10/13/2022   IR RADIOLOGIST EVAL & MGMT  12/12/2022   IR US GUIDE VASC ACCESS RIGHT  02/07/2022   IR US GUIDE VASC ACCESS RIGHT  09/05/2022   JOINT REPLACEMENT     KNEE SURGERY Right 09/24/2017   plates and pins   PORT-A-CATH REMOVAL N/A 09/23/2022   Procedure: MINOR REMOVAL PORT-A-CATH;  Surgeon: Franky Macho, MD;  Location: [facility];  Service: General;  Laterality: N/A;   TEE WITHOUT CARDIOVERSION N/A 09/23/2022   Procedure: TRANSESOPHAGEAL ECHOCARDIOGRAM (TEE);  Surgeon: Antoine Poche, MD;  Location: [facility];  Service: Endoscopy;  Laterality: N/A;   TOTAL SHOULDER ARTHROPLASTY Right 09/27/2015   Patient Active Problem List   Diagnosis Date Noted   Acute hepatic encephalopathy (HCC) 01/19/2023   Pancytopenia (HCC) 01/06/2023   Anemia 01/06/2023   Internal hemorrhoid 01/06/2023   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   Symptomatic anemia 12/17/2022   Hypokalemia 11/06/2022   Acute on chronic anemia 11/03/2022   Hypotension 11/03/2022   AKI (acute kidney injury) (HCC) 10/15/2022   Rectal bleeding 06/17/2022   Obesity (BMI 30-39.9) 06/17/2022   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 mellitus (HCC) 06/17/2022   Grade IV internal hemorrhoids 05/16/2022   Thrombocytopenia (HCC) 03/05/2022   Anemia in chronic kidney disease (CKD) 02/07/2022   GI bleeding 02/04/2022   Chronic  kidney disease, stage 3b (HCC) 02/04/2022   Depression with anxiety 02/04/2022   Liver cirrhosis secondary to NASH (HCC)    Insomnia 01/27/2022   Iron deficiency anemia due to chronic blood loss 02/13/2021   History of uterine cancer 02/13/2021   Decompensated hepatic cirrhosis (HCC) 10/25/2020   OSA on CPAP 10/25/2020   Acute respiratory failure with hypoxia (HCC) 09/10/2020   Closed right hip fracture (HCC) 09/02/2020   Type 2 diabetes mellitus with hyperlipidemia (HCC) 09/02/2020   Thalassemia minor    HLD (hyperlipidemia)    Acute kidney injury superimposed on CKD (HCC)    Depression     ONSET DATE: 01/06/23  REFERRING DIAG: Hepatic Encephalopathy  THERAPY DIAG:  Other lack of coordination  Other symptoms and signs involving the nervous system  Rationale for Evaluation and Treatment: Rehabilitation  SUBJECTIVE:   SUBJECTIVE STATEMENT: "I wouldn't have been able to lift my arm up like this  yesterday" Pt accompanied by: self  PERTINENT HISTORY: PMH significant for diabetes mellitus hypertension [name] status post TIPS revision at [facility] weekly paracentesis recurrent grade 4 internal hemorrhoids status post embolization of superior rectal artery 11/05/2022, multifactorial anemia, OSA with CPAP, CKD stage III, thrombocytopenia/leukopenia due to splenic sequestration/cirrhosis, beta thalassemia intermedia per hematology/oncology, hyperlipidemia, chronic orthostasis with ProAmatine.   PRECAUTIONS: None  WEIGHT BEARING RESTRICTIONS: No  PAIN:  Are you having pain? Yes: NPRS scale: 5/10 Pain location: anterior shoulder girdle Pain description: aching Aggravating factors: movement Relieving factors: Not moving it  FALLS: Has patient fallen in last 6 months? No  LIVING ENVIRONMENT: Lives with: lives with their spouse Lives in: House/apartment Has following equipment at home: Counselling psychologist, Environmental consultant - 4 wheeled, Tour manager, and Grab bars  PLOF:  Independent with household mobility with device and Requires assistive device for independence  PATIENT GOALS: To strengthen my arms  OBJECTIVE:   HAND DOMINANCE: Right  ADLs: Overall ADLs: Due to weakness and fatigue pt requires assist with all IADL's and compensatory strategies for BADL's  MOBILITY STATUS:  Independent with rollator  POSTURE COMMENTS:  No Significant postural limitations Sitting balance:  Independent  ACTIVITY TOLERANCE: Activity tolerance: Decreased endurance in standing to complete tasks, gives out quickly  FUNCTIONAL OUTCOME MEASURES: Upper Extremity Functional Scale (UEFS): 60.0%  UPPER EXTREMITY ROM:    Pt has full ROM  UPPER EXTREMITY MMT:   RUE weaker than left  MMT Right eval Left eval  Shoulder flexion 3+/5 4-/5  Shoulder abduction 3+/5 4-/5  Shoulder adduction 4-/5 4/5  Shoulder extension 4/5 4/5  Shoulder internal rotation 4-/5 4/5  Shoulder external rotation 4-/5 4/5  Elbow flexion 4/5 4+/5  Elbow extension 4-/5 4/5  Wrist flexion 4/5 4/5  Wrist extension 3+/5 4/5  Wrist ulnar deviation 4-/5 4/5  Wrist radial deviation 3+/5 4/5  Wrist pronation 3+/5 4/5  Wrist supination 3+/5 4/5  (Blank rows = not tested)  HAND FUNCTION: Grip strength: Right: -- lbs; Left: -- lbs, Lateral pinch: Right: -- lbs, Left: -- lbs, and 3 point pinch: Right: -- lbs, Left: -- lbs  COORDINATION: 9 Hole Peg test: Right: -- sec; Left: -- sec  SENSATION: WFL  EDEMA: No swelling noted, pt reports occasionally when she is retaining fluid.   OBSERVATIONS: RUE increased stiffness and pain since reverse total shoulder   TODAY'S TREATMENT:                                                                                                                              DATE:    03/19/23 -Pulleys: flexion and abduction, x60" each -Functional Reaching: seated, placing 15 pegs at shoulder-head height, BUE -Scapular Strengthening: red band, extension, retraction,  protraction, rows, x12 -A/ROM: seated, flexion, abduction, protraction, horizontal abduction, er/IR, x15 -Manual Therapy: myofascial release and trigger point applied to BUE biceps, deltoids, trapezius, and scapular region, in order to reduce pain and fascial restrictions to  improve ROM.  02/25/23 -Manual Therapy: myofascial release and trigger point applied to BUE biceps, deltoids, trapezius, and scapular region, in order to reduce pain and fascial restrictions to improve ROM. -AA/ROM: supine, BUE, flexion, abduction, protraction, horizontal abduction, er/IR, x10 -Proximal shoulder exercises: paddles, criss cross, circles both directions, x10   PATIENT EDUCATION: Education details: A/ROM Person educated: Patient Education method: Explanation Education comprehension: verbalized understanding  HOME EXERCISE PROGRAM: 7/10: AA/ROM 8/1: A/ROM   GOALS: Goals reviewed with patient? Yes  SHORT TERM GOALS: Target date: 04/03/23  Pt will be provided and educated on HEP for BUE mobility and strength for ADL completion.  Goal status: IN PROGRESS  2.  Pt will decrease pain in BUE to 3/10 or less in order to sleep for 3+ consecutive hours without waking due to pain.   Goal status: IN PROGRESS  3.  Pt will increased BUE strength to 4+/5 in order to lift items to complete cooking and cleaning tasks.   Goal status: IN PROGRESS  4.  Pt will increase BUE grip strength by 10# and pinch strength by 3#.  Goal status: IN PROGRESS  5.  Pt will improve BUE coordination to 40" or less in order to manipulate small items during dressing and bathing tasks with no compensatory strategies or AE.   Goal status: IN PROGRESS    ASSESSMENT:  CLINICAL IMPRESSION: This session pt worked on mobility exercises at the beginning of the session to assess how she loosens up and is able to mobilize before completing manual therapy. She continues to have limited ROM, approximately 65% of full A/ROM in sitting  and limited functional reaching with fatigue noted due to weakness. Pt did report that she was moving better today compared to the past few days. OT providing verbal and tactile cuing for positioning and technique.   PERFORMANCE DEFICITS: in functional skills including ADLs, IADLs, coordination, dexterity, ROM, strength, pain, fascial restrictions, muscle spasms, Fine motor control, Gross motor control, body mechanics, and UE functional use.   PLAN:  OT FREQUENCY: 1x/week  OT DURATION: 6 weeks  PLANNED INTERVENTIONS: self care/ADL training, therapeutic exercise, therapeutic activity, manual therapy, passive range of motion, functional mobility training, electrical stimulation, ultrasound, paraffin, moist heat, patient/family education, coping strategies training, and DME and/or AE instructions  RECOMMENDED OTHER SERVICES: PT  CONSULTED AND AGREED WITH PLAN OF CARE: Patient  PLAN FOR NEXT SESSION: Manual Therapy as needed, A/ROM, coordination tasks, gripping tasks, pinching tasks.   Trish Mage, OTR/L [facility] [phone]  Rosemarie Beath, OT 03/20/2023, 10:19 AM

## 2023-03-20 ENCOUNTER — Other Ambulatory Visit: Payer: Self-pay | Admitting: Gastroenterology

## 2023-03-20 DIAGNOSIS — K7460 Unspecified cirrhosis of liver: Secondary | ICD-10-CM

## 2023-03-23 ENCOUNTER — Other Ambulatory Visit: Payer: Self-pay

## 2023-03-23 ENCOUNTER — Inpatient Hospital Stay: Payer: Medicare Other | Attending: Hematology

## 2023-03-23 DIAGNOSIS — D561 Beta thalassemia: Secondary | ICD-10-CM | POA: Insufficient documentation

## 2023-03-23 DIAGNOSIS — D631 Anemia in chronic kidney disease: Secondary | ICD-10-CM | POA: Insufficient documentation

## 2023-03-23 DIAGNOSIS — D500 Iron deficiency anemia secondary to blood loss (chronic): Secondary | ICD-10-CM

## 2023-03-23 DIAGNOSIS — K7682 Hepatic encephalopathy: Secondary | ICD-10-CM | POA: Diagnosis not present

## 2023-03-23 DIAGNOSIS — D61818 Other pancytopenia: Secondary | ICD-10-CM

## 2023-03-23 DIAGNOSIS — N184 Chronic kidney disease, stage 4 (severe): Secondary | ICD-10-CM | POA: Insufficient documentation

## 2023-03-23 LAB — CBC
HCT: 21 % — ABNORMAL LOW (ref 36.0–46.0)
Hemoglobin: 6.3 g/dL — CL (ref 12.0–15.0)
MCH: 21.6 pg — ABNORMAL LOW (ref 26.0–34.0)
MCHC: 30 g/dL (ref 30.0–36.0)
MCV: 71.9 fL — ABNORMAL LOW (ref 80.0–100.0)
Platelets: 231 K/uL (ref 150–400)
RBC: 2.92 MIL/uL — ABNORMAL LOW (ref 3.87–5.11)
RDW: 22.9 % — ABNORMAL HIGH (ref 11.5–15.5)
WBC: 3.9 K/uL — ABNORMAL LOW (ref 4.0–10.5)
nRBC: 0 % (ref 0.0–0.2)

## 2023-03-23 LAB — SAMPLE TO BLOOD BANK

## 2023-03-23 MED ORDER — SODIUM CHLORIDE 0.9% FLUSH
10.0000 mL | INTRAVENOUS | Status: DC | PRN
  Administered 2023-03-23: 10 mL via INTRAVENOUS

## 2023-03-23 MED ORDER — HEPARIN SOD (PORK) LOCK FLUSH 100 UNIT/ML IV SOLN
500.0000 [IU] | Freq: Once | INTRAVENOUS | Status: AC
  Administered 2023-03-23: 500 [IU] via INTRAVENOUS

## 2023-03-23 NOTE — Patient Instructions (Signed)

## 2023-03-23 NOTE — Progress Notes (Signed)
Patients port flushed without difficulty.  Good blood return noted with no bruising or swelling noted at site.  Band aid applied.  VSS with discharge and left in satisfactory condition with no s/s of distress noted.   

## 2023-03-24 ENCOUNTER — Other Ambulatory Visit: Payer: Self-pay | Admitting: Gastroenterology

## 2023-03-24 ENCOUNTER — Ambulatory Visit
Admission: RE | Admit: 2023-03-24 | Discharge: 2023-03-24 | Disposition: A | Discharge status: Home / Self Care | Payer: Medicare Other | Source: Ambulatory Visit | Attending: Gastroenterology | Admitting: Gastroenterology

## 2023-03-24 DIAGNOSIS — K7581 Nonalcoholic steatohepatitis (NASH): Secondary | ICD-10-CM | POA: Diagnosis not present

## 2023-03-24 DIAGNOSIS — K7460 Unspecified cirrhosis of liver: Secondary | ICD-10-CM | POA: Diagnosis present

## 2023-03-24 LAB — TYPE AND SCREEN
ABO/RH(D): A POS
Antibody Screen: NEGATIVE
Unit division: 0

## 2023-03-24 LAB — BPAM RBC
Blood Product Expiration Date: 202408242359
Unit Type and Rh: 6200

## 2023-03-24 LAB — PREPARE RBC (CROSSMATCH)

## 2023-03-24 NOTE — Addendum Note (Signed)
Addended by: Harrel Lemon on: 03/24/2023 09:09 AM   Modules accepted: Orders

## 2023-03-25 ENCOUNTER — Other Ambulatory Visit: Payer: Self-pay

## 2023-03-25 ENCOUNTER — Inpatient Hospital Stay (HOSPITAL_COMMUNITY)
Admission: EM | Admit: 2023-03-25 | Discharge: 2023-03-27 | DRG: 441 | Disposition: A | Discharge status: Home / Self Care | Payer: Medicare Other | Attending: Internal Medicine | Admitting: Internal Medicine

## 2023-03-25 ENCOUNTER — Inpatient Hospital Stay: Payer: Medicare Other

## 2023-03-25 ENCOUNTER — Emergency Department (HOSPITAL_COMMUNITY): Payer: Medicare Other

## 2023-03-25 DIAGNOSIS — I129 Hypertensive chronic kidney disease with stage 1 through stage 4 chronic kidney disease, or unspecified chronic kidney disease: Secondary | ICD-10-CM | POA: Diagnosis present

## 2023-03-25 DIAGNOSIS — Z8616 Personal history of COVID-19: Secondary | ICD-10-CM | POA: Diagnosis not present

## 2023-03-25 DIAGNOSIS — Z90710 Acquired absence of both cervix and uterus: Secondary | ICD-10-CM

## 2023-03-25 DIAGNOSIS — Z96611 Presence of right artificial shoulder joint: Secondary | ICD-10-CM | POA: Diagnosis present

## 2023-03-25 DIAGNOSIS — E1140 Type 2 diabetes mellitus with diabetic neuropathy, unspecified: Secondary | ICD-10-CM | POA: Diagnosis present

## 2023-03-25 DIAGNOSIS — D61818 Other pancytopenia: Secondary | ICD-10-CM | POA: Diagnosis present

## 2023-03-25 DIAGNOSIS — D649 Anemia, unspecified: Secondary | ICD-10-CM | POA: Diagnosis not present

## 2023-03-25 DIAGNOSIS — N3000 Acute cystitis without hematuria: Secondary | ICD-10-CM

## 2023-03-25 DIAGNOSIS — I959 Hypotension, unspecified: Secondary | ICD-10-CM | POA: Diagnosis present

## 2023-03-25 DIAGNOSIS — N184 Chronic kidney disease, stage 4 (severe): Secondary | ICD-10-CM | POA: Diagnosis present

## 2023-03-25 DIAGNOSIS — N3001 Acute cystitis with hematuria: Secondary | ICD-10-CM | POA: Diagnosis not present

## 2023-03-25 DIAGNOSIS — J309 Allergic rhinitis, unspecified: Secondary | ICD-10-CM | POA: Diagnosis present

## 2023-03-25 DIAGNOSIS — E1122 Type 2 diabetes mellitus with diabetic chronic kidney disease: Secondary | ICD-10-CM | POA: Diagnosis present

## 2023-03-25 DIAGNOSIS — Z9841 Cataract extraction status, right eye: Secondary | ICD-10-CM

## 2023-03-25 DIAGNOSIS — F32A Depression, unspecified: Secondary | ICD-10-CM | POA: Diagnosis present

## 2023-03-25 DIAGNOSIS — F419 Anxiety disorder, unspecified: Secondary | ICD-10-CM | POA: Diagnosis present

## 2023-03-25 DIAGNOSIS — W19XXXA Unspecified fall, initial encounter: Secondary | ICD-10-CM | POA: Diagnosis present

## 2023-03-25 DIAGNOSIS — Z91040 Latex allergy status: Secondary | ICD-10-CM

## 2023-03-25 DIAGNOSIS — B9620 Unspecified Escherichia coli [E. coli] as the cause of diseases classified elsewhere: Secondary | ICD-10-CM | POA: Diagnosis present

## 2023-03-25 DIAGNOSIS — Z961 Presence of intraocular lens: Secondary | ICD-10-CM | POA: Diagnosis present

## 2023-03-25 DIAGNOSIS — D563 Thalassemia minor: Secondary | ICD-10-CM | POA: Diagnosis present

## 2023-03-25 DIAGNOSIS — K649 Unspecified hemorrhoids: Secondary | ICD-10-CM | POA: Diagnosis present

## 2023-03-25 DIAGNOSIS — K219 Gastro-esophageal reflux disease without esophagitis: Secondary | ICD-10-CM | POA: Diagnosis present

## 2023-03-25 DIAGNOSIS — G4733 Obstructive sleep apnea (adult) (pediatric): Secondary | ICD-10-CM | POA: Diagnosis present

## 2023-03-25 DIAGNOSIS — D329 Benign neoplasm of meninges, unspecified: Secondary | ICD-10-CM | POA: Diagnosis present

## 2023-03-25 DIAGNOSIS — K7682 Hepatic encephalopathy: Secondary | ICD-10-CM | POA: Diagnosis not present

## 2023-03-25 DIAGNOSIS — E669 Obesity, unspecified: Secondary | ICD-10-CM | POA: Diagnosis present

## 2023-03-25 DIAGNOSIS — D631 Anemia in chronic kidney disease: Secondary | ICD-10-CM | POA: Diagnosis present

## 2023-03-25 DIAGNOSIS — Z91199 Patient's noncompliance with other medical treatment and regimen due to unspecified reason: Secondary | ICD-10-CM

## 2023-03-25 DIAGNOSIS — E1165 Type 2 diabetes mellitus with hyperglycemia: Secondary | ICD-10-CM | POA: Diagnosis present

## 2023-03-25 DIAGNOSIS — K7581 Nonalcoholic steatohepatitis (NASH): Secondary | ICD-10-CM | POA: Diagnosis present

## 2023-03-25 DIAGNOSIS — K7290 Hepatic failure, unspecified without coma: Secondary | ICD-10-CM | POA: Diagnosis present

## 2023-03-25 DIAGNOSIS — Z6834 Body mass index (BMI) 34.0-34.9, adult: Secondary | ICD-10-CM

## 2023-03-25 DIAGNOSIS — Z9842 Cataract extraction status, left eye: Secondary | ICD-10-CM

## 2023-03-25 DIAGNOSIS — G9341 Metabolic encephalopathy: Secondary | ICD-10-CM | POA: Diagnosis not present

## 2023-03-25 DIAGNOSIS — K7460 Unspecified cirrhosis of liver: Secondary | ICD-10-CM | POA: Diagnosis not present

## 2023-03-25 DIAGNOSIS — Z79899 Other long term (current) drug therapy: Secondary | ICD-10-CM

## 2023-03-25 DIAGNOSIS — Z8249 Family history of ischemic heart disease and other diseases of the circulatory system: Secondary | ICD-10-CM

## 2023-03-25 DIAGNOSIS — Z833 Family history of diabetes mellitus: Secondary | ICD-10-CM

## 2023-03-25 DIAGNOSIS — Z91048 Other nonmedicinal substance allergy status: Secondary | ICD-10-CM

## 2023-03-25 LAB — CBC
HCT: 20.4 % — ABNORMAL LOW (ref 36.0–46.0)
Hemoglobin: 6.5 g/dL — CL (ref 12.0–15.0)
MCH: 22.6 pg — ABNORMAL LOW (ref 26.0–34.0)
MCHC: 31.9 g/dL (ref 30.0–36.0)
MCV: 70.8 fL — ABNORMAL LOW (ref 80.0–100.0)
Platelets: 241 10*3/uL (ref 150–400)
RBC: 2.88 MIL/uL — ABNORMAL LOW (ref 3.87–5.11)
RDW: 22.9 % — ABNORMAL HIGH (ref 11.5–15.5)
WBC: 5.9 10*3/uL (ref 4.0–10.5)
nRBC: 0 % (ref 0.0–0.2)

## 2023-03-25 LAB — URINALYSIS, ROUTINE W REFLEX MICROSCOPIC
Bilirubin Urine: NEGATIVE
Glucose, UA: NEGATIVE mg/dL
Ketones, ur: NEGATIVE mg/dL
Nitrite: NEGATIVE
Protein, ur: 30 mg/dL — AB
RBC / HPF: >50 RBC/hpf (ref 0–5)
Specific Gravity, Urine: 1.012 (ref 1.005–1.030)
WBC, UA: >50 WBC/hpf (ref 0–5)
pH: 5 (ref 5.0–8.0)

## 2023-03-25 LAB — PROTIME-INR
INR: 1.2 (ref 0.8–1.2)
Prothrombin Time: 15.7 seconds — ABNORMAL HIGH (ref 11.4–15.2)

## 2023-03-25 LAB — COMPREHENSIVE METABOLIC PANEL
ALT: 27 U/L (ref 0–44)
AST: 51 U/L — ABNORMAL HIGH (ref 15–41)
Albumin: 2.5 g/dL — ABNORMAL LOW (ref 3.5–5.0)
Alkaline Phosphatase: 117 U/L (ref 38–126)
Anion gap: 7 (ref 5–15)
BUN: 38 mg/dL — ABNORMAL HIGH (ref 8–23)
CO2: 22 mmol/L (ref 22–32)
Calcium: 8.3 mg/dL — ABNORMAL LOW (ref 8.9–10.3)
Chloride: 105 mmol/L (ref 98–111)
Creatinine, Ser: 1.5 mg/dL — ABNORMAL HIGH (ref 0.44–1.00)
GFR, Estimated: 38 mL/min — ABNORMAL LOW (ref >60)
Glucose, Bld: 216 mg/dL — ABNORMAL HIGH (ref 70–99)
Potassium: 3.8 mmol/L (ref 3.5–5.1)
Sodium: 134 mmol/L — ABNORMAL LOW (ref 135–145)
Total Bilirubin: 2.4 mg/dL — ABNORMAL HIGH (ref 0.3–1.2)
Total Protein: 5.3 g/dL — ABNORMAL LOW (ref 6.5–8.1)

## 2023-03-25 LAB — AMMONIA: Ammonia: 105 umol/L — ABNORMAL HIGH (ref 9–35)

## 2023-03-25 LAB — PREPARE RBC (CROSSMATCH)

## 2023-03-25 MED ORDER — GABAPENTIN 100 MG PO CAPS
100.0000 mg | ORAL_CAPSULE | Freq: Two times a day (BID) | ORAL | Status: DC
  Administered 2023-03-25 – 2023-03-27 (×4): 100 mg via ORAL
  Filled 2023-03-25 (×4): qty 1

## 2023-03-25 MED ORDER — LACTULOSE 10 GM/15ML PO SOLN
20.0000 g | Freq: Three times a day (TID) | ORAL | Status: AC
  Administered 2023-03-25 – 2023-03-26 (×4): 20 g via ORAL
  Filled 2023-03-25 (×4): qty 30

## 2023-03-25 MED ORDER — SODIUM CHLORIDE 0.9 % IV SOLN
2.0000 g | Freq: Once | INTRAVENOUS | Status: AC
  Administered 2023-03-25: 2 g via INTRAVENOUS
  Filled 2023-03-25: qty 20

## 2023-03-25 MED ORDER — ONDANSETRON HCL 4 MG PO TABS: 4.0000 mg | ORAL_TABLET | Freq: Four times a day (QID) | ORAL | Status: DC | PRN

## 2023-03-25 MED ORDER — ATORVASTATIN CALCIUM 10 MG PO TABS
10.0000 mg | ORAL_TABLET | Freq: Every day | ORAL | Status: DC
  Administered 2023-03-25 – 2023-03-27 (×3): 10 mg via ORAL
  Filled 2023-03-25 (×3): qty 1

## 2023-03-25 MED ORDER — SODIUM CHLORIDE 0.9 % IV SOLN
2.0000 g | INTRAVENOUS | Status: DC
  Administered 2023-03-26: 2 g via INTRAVENOUS
  Filled 2023-03-25: qty 20

## 2023-03-25 MED ORDER — SPIRONOLACTONE 25 MG PO TABS
50.0000 mg | ORAL_TABLET | Freq: Every day | ORAL | Status: DC
  Administered 2023-03-25 – 2023-03-27 (×3): 50 mg via ORAL
  Filled 2023-03-25 (×3): qty 2

## 2023-03-25 MED ORDER — ALBUTEROL SULFATE (2.5 MG/3ML) 0.083% IN NEBU: 3.0000 mL | INHALATION_SOLUTION | Freq: Four times a day (QID) | RESPIRATORY_TRACT | Status: DC | PRN

## 2023-03-25 MED ORDER — ADULT MULTIVITAMIN W/MINERALS CH
1.0000 | ORAL_TABLET | Freq: Every day | ORAL | Status: DC
  Administered 2023-03-26 – 2023-03-27 (×2): 1 via ORAL
  Filled 2023-03-25 (×3): qty 1

## 2023-03-25 MED ORDER — ONDANSETRON HCL 4 MG/2ML IJ SOLN: 4.0000 mg | Freq: Four times a day (QID) | INTRAMUSCULAR | Status: DC | PRN

## 2023-03-25 MED ORDER — LACTULOSE 10 GM/15ML PO SOLN
30.0000 g | Freq: Once | ORAL | Status: AC
  Administered 2023-03-25: 30 g via ORAL
  Filled 2023-03-25: qty 60

## 2023-03-25 MED ORDER — SODIUM CHLORIDE 0.9% IV SOLUTION: Freq: Once | INTRAVENOUS | Status: DC

## 2023-03-25 MED ORDER — ACETAMINOPHEN 325 MG PO TABS
650.0000 mg | ORAL_TABLET | Freq: Four times a day (QID) | ORAL | Status: DC | PRN
  Administered 2023-03-25 – 2023-03-26 (×3): 650 mg via ORAL
  Filled 2023-03-25 (×3): qty 2

## 2023-03-25 MED ORDER — SERTRALINE HCL 50 MG PO TABS
50.0000 mg | ORAL_TABLET | Freq: Every day | ORAL | Status: DC
  Administered 2023-03-25 – 2023-03-27 (×3): 50 mg via ORAL
  Filled 2023-03-25 (×3): qty 1

## 2023-03-25 MED ORDER — TORSEMIDE 20 MG PO TABS
40.0000 mg | ORAL_TABLET | Freq: Every day | ORAL | Status: DC
  Administered 2023-03-25 – 2023-03-27 (×3): 40 mg via ORAL
  Filled 2023-03-25 (×3): qty 2

## 2023-03-25 MED ORDER — ACETAMINOPHEN 650 MG RE SUPP: 650.0000 mg | Freq: Four times a day (QID) | RECTAL | Status: DC | PRN

## 2023-03-25 NOTE — H&P (Addendum)
History and Physical    Patient: Rachel Huber DOB: 1955-09-24 DOA: 03/25/2023 DOS: the patient was seen and examined on 03/25/2023 PCP: Lauro Regulus, MD  Patient coming from: Home  Chief Complaint:  Chief Complaint  Patient presents with   Fall   Fatigue    Pt is in liver failure, a scheduled blood transfusion today.   HPI: Rachel Huber is a 67 year old female with history of [name] liver cirrhosis with refractory ascites status post TIPS creation (s/p revision x2), thalassemia minor, significant rectal bleeding status post superior rectal artery embolization x 2 on 02/07/22, with recurrent grade 4 internal hemorrhoids and coil embolization of the splenic artery 01/06/2023 presenting with lethargy and a mechanical fall at ground-level.  The patient went to radiology for her usual scheduled paracentesis on 03/24/2023.  Ultrasound showed there was insufficient ascites for paracentesis.  Spouse states that the patient became increasingly somnolent after she went home.  In the morning of 03/25/2023, the patient was try to get out of bed and fell onto her buttock.  There is no loss of consciousness.  When she woke up in the morning of 03/25/2023, the patient continued to have lethargy.  As a result, the patient was brought to the emergency department for further evaluation and treatment. History is supplemented by the patient's spouse.  The patient has not had any fevers, chills, chest pain, shortness breath, nausea, vomiting.  She has some loose stools.  She has had a scant amount of " pink on the toilet paper".  She denies abdominal pain or dysuria.  She denies any headache or neck pain.  The patient states that she has not been taking her lactulose as directed.  She has been taking it once a day rather than the usual 3 times daily. In the ED, the patient had low-grade temperature 99.5 F.  She was hemodynamically stable with oxygen saturation 98% on room air.  WBC 5.9, hemoglobin 6.5,  platelets 241.  Sodium 134, potassium 3.8, bicarbonate 22, serum creatinine 1.50.  AST 51, ALT 27, alk phosphatase 117, total bilirubin 2.4.  Ammonia 105.  EKG shows sinus rhythm with no ST ST changes.  CT of the brain was negative for any acute findings.  There is an unchanged meningioma.  UA  > 50 WBC. the patient was started on lactulose and ceftriaxone.  Review of Systems: As mentioned in the history of present illness. All other systems reviewed and are negative. Past Medical History:  Diagnosis Date   Anemia in chronic kidney disease (CKD) 02/07/2022   Anxiety    Arthritis    Ascites of liver    Weekly paracentesis   Cirrhosis of liver (HCC)    CKD (chronic kidney disease) stage 4, GFR 15-29 ml/min (HCC)    GERD (gastroesophageal reflux disease)    Grade IV internal hemorrhoids    Hypertension    NASH (nonalcoholic steatohepatitis)    Neuropathy, diabetic (HCC)    Obesity (BMI 30-39.9)    OSA on CPAP    Pneumonia    Portal hypertension (HCC)    S/P TIPS (transjugular intrahepatic portosystemic shunt)    Thalassemia minor 1992   Past Surgical History:  Procedure Laterality Date   ABDOMINAL HYSTERECTOMY     BREAST BIOPSY Right 02/15/2018   [place] bx 6-6:30 ribbon shape, ONE CORE FRAGMENT WITH FIBROSIS. ONE CORE FRAGMENT WITH PORTION OF A DILATED   BREAST BIOPSY Right 02/15/2018   [place] bx 9:00 heart shape, USUAL DUCTAL HYPERPLASIA  BREAST LUMPECTOMY Right 03/09/2018   Procedure: BREAST LUMPECTOMY x 2;  Surgeon: Sung Amabile, DO;  Location: [facility];  Service: General;  Laterality: Right;   CATARACT EXTRACTION W/PHACO Left 06/11/2016   Procedure: CATARACT EXTRACTION PHACO AND INTRAOCULAR LENS PLACEMENT (IOC);  Surgeon: Sallee Lange, MD;  Location: [facility];  Service: Ophthalmology;  Laterality: Left;  Lot # [id number] H US:01:38.6 AP%:26.4 CDE:44.15   CATARACT EXTRACTION W/PHACO Right 06/15/2018   Procedure: CATARACT EXTRACTION PHACO AND INTRAOCULAR LENS PLACEMENT (IOC);  Surgeon:  Galen Manila, MD;  Location: [facility];  Service: Ophthalmology;  Laterality: Right;  [place] 00:38.2 CDE 4.23 Fluid Pack Lot # [id number]   COLONOSCOPIES     COLONOSCOPY WITH PROPOFOL N/A 10/10/2020   Procedure: COLONOSCOPY WITH PROPOFOL;  Surgeon: Regis Bill, MD;  Location: [facility] ENDOSCOPY;  Service: Endoscopy;  Laterality: N/A;   COLONOSCOPY WITH PROPOFOL N/A 11/20/2020   Procedure: COLONOSCOPY WITH PROPOFOL;  Surgeon: Regis Bill, MD;  Location: [facility] ENDOSCOPY;  Service: Endoscopy;  Laterality: N/A;  DM STAT CBC, BMP COVID POSITIVE 09/02/2020   COLONOSCOPY WITH PROPOFOL N/A 06/19/2022   Procedure: COLONOSCOPY WITH PROPOFOL;  Surgeon: Regis Bill, MD;  Location: [facility] ENDOSCOPY;  Service: Endoscopy;  Laterality: N/A;   CTR     ESOPHAGOGASTRODUODENOSCOPY (EGD) WITH PROPOFOL N/A 10/10/2020   Procedure: ESOPHAGOGASTRODUODENOSCOPY (EGD) WITH PROPOFOL;  Surgeon: Regis Bill, MD;  Location: [facility] ENDOSCOPY;  Service: Endoscopy;  Laterality: N/A;  COVID POSITIVE 10/08/2020   FLEXIBLE SIGMOIDOSCOPY N/A 02/06/2022   Procedure: FLEXIBLE SIGMOIDOSCOPY;  Surgeon: Regis Bill, MD;  Location: [facility] ENDOSCOPY;  Service: Endoscopy;  Laterality: N/A;  Patient requests anesthesia   FLEXIBLE SIGMOIDOSCOPY N/A 12/19/2022   Procedure: FLEXIBLE SIGMOIDOSCOPY;  Surgeon: Dolores Frame, MD;  Location: [facility];  Service: Gastroenterology;  Laterality: N/A;   IR ANGIOGRAM SELECTIVE EACH ADDITIONAL VESSEL  02/07/2022   IR ANGIOGRAM SELECTIVE EACH ADDITIONAL VESSEL  02/07/2022   IR ANGIOGRAM SELECTIVE EACH ADDITIONAL VESSEL  09/05/2022   IR ANGIOGRAM VISCERAL SELECTIVE  02/07/2022   IR ANGIOGRAM VISCERAL SELECTIVE  09/05/2022   IR CV LINE INJECTION  02/24/2023   IR EMBO ART  VEN HEMORR LYMPH EXTRAV  INC GUIDE ROADMAPPING  09/05/2022   IR EMBO ART  VEN HEMORR LYMPH EXTRAV  INC GUIDE ROADMAPPING  11/05/2022   IR EMBO ARTERIAL NOT HEMORR HEMANG INC GUIDE ROADMAPPING  02/07/2022   IR  EMBO TUMOR ORGAN ISCHEMIA INFARCT INC GUIDE ROADMAPPING  01/06/2023   IR IMAGING GUIDED PORT INSERTION  06/13/2022   IR IMAGING GUIDED PORT INSERTION  11/25/2022   IR PARACENTESIS  12/17/2021   IR PARACENTESIS  03/25/2022   IR PARACENTESIS  11/25/2022   IR PARACENTESIS  01/06/2023   IR PARACENTESIS  01/19/2023   IR RADIOLOGIST EVAL & MGMT  03/18/2022   IR RADIOLOGIST EVAL & MGMT  05/07/2022   IR RADIOLOGIST EVAL & MGMT  05/16/2022   IR RADIOLOGIST EVAL & MGMT  10/13/2022   IR RADIOLOGIST EVAL & MGMT  12/12/2022   IR US GUIDE VASC ACCESS RIGHT  02/07/2022   IR US GUIDE VASC ACCESS RIGHT  09/05/2022   JOINT REPLACEMENT     KNEE SURGERY Right 09/24/2017   plates and pins   PORT-A-CATH REMOVAL N/A 09/23/2022   Procedure: MINOR REMOVAL PORT-A-CATH;  Surgeon: Franky Macho, MD;  Location: [facility];  Service: General;  Laterality: N/A;   TEE WITHOUT CARDIOVERSION N/A 09/23/2022   Procedure: TRANSESOPHAGEAL ECHOCARDIOGRAM (TEE);  Surgeon: Antoine Poche, MD;  Location:  [facility];  Service: Endoscopy;  Laterality: N/A;   TOTAL SHOULDER ARTHROPLASTY Right 09/27/2015   Social History:  reports that she has never smoked. She has never used smokeless tobacco. She reports that she does not drink alcohol and does not use drugs.  Allergies  Allergen Reactions   Latex Rash    Contact rash   Tape Other (See Comments)    Skin sensitivity   Gramineae Pollens Other (See Comments)    Sneezing, Running nose  Other Reaction(s): Other (see comments)    Family History  Problem Relation Age of Onset   Breast cancer Mother 14   Lymphoma Mother    Diabetes Father    Kidney cancer Father    Heart disease Father    Diabetes Sister    Breast cancer Sister 22    Prior to Admission medications   Medication Sig Start Date End Date Taking? Authorizing Provider  acetaminophen (TYLENOL) 500 MG tablet Take 1 tablet (500 mg total) by mouth 2 (two) times daily as needed for mild pain. 01/26/23   Angiulli, Mcarthur Rossetti, PA-C  albuterol  (VENTOLIN HFA) 108 (90 Base) MCG/ACT inhaler Inhale 2 puffs into the lungs every 6 (six) hours as needed for wheezing or shortness of breath. 01/26/23   Angiulli, Mcarthur Rossetti, PA-C  atorvastatin (LIPITOR) 10 MG tablet Take 1 tablet (10 mg total) by mouth daily. 01/26/23   Angiulli, Mcarthur Rossetti, PA-C  butalbital-acetaminophen-caffeine (FIORICET) [phone] MG tablet Take 1 tablet by mouth every 6 (six) hours as needed for headache. 01/26/23   Angiulli, Mcarthur Rossetti, PA-C  diclofenac Sodium (VOLTAREN) 1 % GEL Apply 4 g topically 4 (four) times daily. 01/26/23   Angiulli, Mcarthur Rossetti, PA-C  gabapentin (NEURONTIN) 100 MG capsule Take 1 capsule (100 mg total) by mouth 2 (two) times daily. 02/17/23   Raulkar, Drema Pry, MD  lactulose (CHRONULAC) 10 GM/15ML solution Take 30 mLs (20 g total) by mouth 3 (three) times daily. 01/26/23   Angiulli, Mcarthur Rossetti, PA-C  Multiple Vitamins-Minerals (MULTIVITAMIN WITH MINERALS) tablet Take 1 tablet by mouth daily.    [provider]  sertraline (ZOLOFT) 50 MG tablet Take 1 tablet (50 mg total) by mouth daily. 01/26/23   Angiulli, Mcarthur Rossetti, PA-C  spironolactone (ALDACTONE) 50 MG tablet Take 1 tablet (50 mg total) by mouth daily. 01/26/23   Angiulli, Mcarthur Rossetti, PA-C  torsemide (DEMADEX) 10 MG tablet Take 30 mg by mouth daily. 01/26/23   [provider]  torsemide (DEMADEX) 20 MG tablet Take 40 mg by mouth daily. 01/26/23   [provider]  torsemide 40 MG TABS Take 40 mg by mouth daily. 01/27/23   Angiulli, Mcarthur Rossetti, PA-C  Vitamin D, Ergocalciferol, (DRISDOL) 1.25 MG (50000 UNIT) CAPS capsule Take 1 capsule by mouth once a week 02/20/23   Carnella Guadalajara, PA-C    Physical Exam: Vitals:   03/25/23 1126 03/25/23 1126  BP:  (!) 120/45  Pulse:  80  Resp:  20  Temp:  99.5 F (37.5 C)  TempSrc:  Oral  SpO2:  98%  Weight: 108.9 kg   Height: 5\' 10"  (1.778 m)    GENERAL:  A&O x 3, NAD, well developed, cooperative, follows commands HEENT: /AT, No thrush, No  icterus, No oral ulcers Neck:  No neck mass, No meningismus, soft, supple CV: RRR, no S3, no S4, no rub, no JVD Lungs: Bibasilar crackles.  No wheezing.  Good air movement Abd: soft/NT +BS, nondistended Ext: 1  +LE edema, no lymphangitis,  no cyanosis, no rashes Neuro:  CN II-XII intact, strength 4/5 in RUE, RLE, strength 4/5 LUE, LLE; sensation intact bilateral; no dysmetria; babinski equivocal  Data Reviewed: Data reviewed from above  Assessment and Plan: Acute metabolic encephalopathy/hepatic encephalopathy -Multifactorial including hepatic encephalopathy, UTI, symptomatic anemia -Start lactulose 20 g 3 times daily -Repeat ammonia level in a.m. -UA >50WBC -CT brain negative for acute findings -Patient states that she has been poorly compliant with lactulose  Decompensated NASH Liver cirrhosis -Restart torsemide spironolactone -Lactulose as discussed above -MELD 18 at time of admission  Symptomatic anemia/Hemorrhoidal bleed -Hemoglobin trended down to 6.5 on the day of admission -Transfuse 1 unit PRBC -Baseline hemoglobin 7-8 -S/P partial splenic embolization on 5/21 by IR.   CKD stage IIIb -Baseline creatinine 1.3-1.6 -Monitor renal function  Hypotension -Continue midodrine on paracentesis status  Type 2 DM with renal complications  Diet Controlled. Most recent A1c was 5.5%  -07/01/2022 hemoglobin A1c 5.5 -Recheck A1c  Depression with anxiety -Continue home sertraline    Advance Care Planning: FULL  Consults: palliative  Family Communication: spouse updated 8/7  Severity of Illness: The appropriate patient status for this patient is INPATIENT. Inpatient status is judged to be reasonable and necessary in order to provide the required intensity of service to ensure the patient's safety. The patient's presenting symptoms, physical exam findings, and initial radiographic and laboratory data in the context of their chronic comorbidities is felt to place them at high  risk for further clinical deterioration. Furthermore, it is not anticipated that the patient will be medically stable for discharge from the hospital within 2 midnights of admission.   * I certify that at the point of admission it is my clinical judgment that the patient will require inpatient hospital care spanning beyond 2 midnights from the point of admission due to high intensity of service, high risk for further deterioration and high frequency of surveillance required.*  Author: Catarina Hartshorn, MD 03/25/2023 2:52 PM  For on call review [email or web address].

## 2023-03-25 NOTE — ED Provider Notes (Signed)
[facility] MEDICAL SURGICAL UNIT Provider Note  CSN: [id number] Arrival date & time: 03/25/23 1036  Chief Complaint(s) Fall and Fatigue (Pt is in liver failure, a scheduled blood transfusion today.)  HPI Rachel Huber is a 67 y.o. 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  Patient was scheduled for a transfusion today given her known pancytopenia but husband noted the patient to be more encephalopathic than usual and the patient suffered a minor fall where she lowered herself to the ground against the bathtub.  No head strike or loss of consciousness.  Denies chest pain, shortness of breath, abdominal pain, vomiting or other systemic symptoms.  Patient is slow to answer questions and is encephalopathic here in the emergency department.   Past Medical History Past Medical History:  Diagnosis Date   Anemia in chronic kidney disease (CKD) 02/07/2022   Anxiety    Arthritis    Ascites of liver    Weekly paracentesis   Cirrhosis of liver (HCC)    CKD (chronic kidney disease) stage 4, GFR 15-29 ml/min (HCC)    GERD (gastroesophageal reflux disease)    Grade IV internal hemorrhoids    Hypertension    NASH (nonalcoholic steatohepatitis)    Neuropathy, diabetic (HCC)    Obesity (BMI 30-39.9)    OSA on CPAP    Pneumonia    Portal hypertension (HCC)    S/P TIPS (transjugular intrahepatic portosystemic shunt)    Thalassemia minor 1992   Patient Active Problem List   Diagnosis Date Noted   Hepatic encephalopathy (HCC) 03/25/2023   Acute metabolic encephalopathy 03/25/2023   Acute cystitis with hematuria 03/25/2023   Acute hepatic encephalopathy (HCC) 01/19/2023   Pancytopenia (HCC) 01/06/2023   Anemia 01/06/2023   Internal hemorrhoid 01/06/2023   Splenic  sequestration 01/06/2023   Symptomatic anemia 12/17/2022   Hypokalemia 11/06/2022   Acute on chronic anemia 11/03/2022   Hypotension 11/03/2022   AKI (acute kidney injury) (HCC) 10/15/2022   Rectal bleeding 06/17/2022   Obesity (BMI 30-39.9) 06/17/2022   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 mellitus (HCC) 06/17/2022   Grade IV internal hemorrhoids 05/16/2022   Thrombocytopenia (HCC) 03/05/2022   Anemia in chronic kidney disease (CKD) 02/07/2022   GI bleeding 02/04/2022   Chronic kidney disease, stage 3b (HCC) 02/04/2022   Depression with anxiety 02/04/2022   Liver cirrhosis secondary to NASH (HCC)    Insomnia 01/27/2022   Iron deficiency anemia due to chronic blood loss 02/13/2021   History of uterine cancer 02/13/2021   Decompensated hepatic cirrhosis (HCC) 10/25/2020   OSA on CPAP 10/25/2020   Acute respiratory failure with hypoxia (HCC) 09/10/2020   Closed right hip fracture (HCC) 09/02/2020   Type 2 diabetes mellitus with hyperlipidemia (HCC) 09/02/2020   Thalassemia minor    HLD (hyperlipidemia)    Acute kidney injury superimposed on CKD (HCC)    Depression    Home Medication(s) Prior to Admission medications   Medication Sig Start Date End Date Taking? Authorizing Provider  acetaminophen (TYLENOL) 500 MG tablet Take 1 tablet (500 mg total) by mouth 2 (two) times daily as needed for mild pain. 01/26/23   Angiulli, Mcarthur Rossetti, PA-C  albuterol (VENTOLIN HFA) 108 (90 Base) MCG/ACT inhaler Inhale 2 puffs into the lungs every 6 (six) hours as needed for wheezing or shortness of breath.  01/26/23   Angiulli, Mcarthur Rossetti, PA-C  atorvastatin (LIPITOR) 10 MG tablet Take 1 tablet (10 mg total) by mouth daily. 01/26/23   Angiulli, Mcarthur Rossetti, PA-C  butalbital-acetaminophen-caffeine (FIORICET) [phone] MG tablet Take 1 tablet by mouth every 6 (six) hours as needed for headache. 01/26/23   Angiulli, Mcarthur Rossetti, PA-C  ciprofloxacin (CIPRO) 500 MG tablet Take 500 mg by mouth daily. 03/03/23 03/02/24   [provider]  diclofenac Sodium (VOLTAREN) 1 % GEL Apply 4 g topically 4 (four) times daily. 01/26/23   Angiulli, Mcarthur Rossetti, PA-C  gabapentin (NEURONTIN) 100 MG capsule Take 1 capsule (100 mg total) by mouth 2 (two) times daily. 02/17/23   Raulkar, Drema Pry, MD  lactulose (CHRONULAC) 10 GM/15ML solution Take 30 mLs (20 g total) by mouth 3 (three) times daily. 01/26/23   Angiulli, Mcarthur Rossetti, PA-C  Multiple Vitamins-Minerals (MULTIVITAMIN WITH MINERALS) tablet Take 1 tablet by mouth daily.    [provider]  sertraline (ZOLOFT) 50 MG tablet Take 1 tablet (50 mg total) by mouth daily. 01/26/23   Angiulli, Mcarthur Rossetti, PA-C  spironolactone (ALDACTONE) 50 MG tablet Take 1 tablet (50 mg total) by mouth daily. 01/26/23   Angiulli, Mcarthur Rossetti, PA-C  torsemide (DEMADEX) 10 MG tablet Take 30 mg by mouth daily. 01/26/23   [provider]  torsemide (DEMADEX) 20 MG tablet Take 40 mg by mouth daily. 01/26/23   [provider]  torsemide 40 MG TABS Take 40 mg by mouth daily. 01/27/23   Angiulli, Mcarthur Rossetti, PA-C  Vitamin D, Ergocalciferol, (DRISDOL) 1.25 MG (50000 UNIT) CAPS capsule Take 1 capsule by mouth once a week 02/20/23   Carnella Guadalajara, PA-C                                                                                                                                    Past Surgical History Past Surgical History:  Procedure Laterality Date   ABDOMINAL HYSTERECTOMY     BREAST BIOPSY Right 02/15/2018   [place] bx 6-6:30 ribbon shape, ONE CORE FRAGMENT WITH FIBROSIS. ONE CORE FRAGMENT WITH PORTION OF A DILATED   BREAST BIOPSY Right 02/15/2018   [place] bx 9:00 heart shape, USUAL DUCTAL HYPERPLASIA   BREAST LUMPECTOMY Right 03/09/2018   Procedure: BREAST LUMPECTOMY x 2;  Surgeon: Sung Amabile, DO;  Location: [facility] ORS;  Service: General;  Laterality: Right;   CATARACT EXTRACTION W/PHACO Left 06/11/2016   Procedure: CATARACT EXTRACTION PHACO AND INTRAOCULAR LENS PLACEMENT (IOC);   Surgeon: Sallee Lange, MD;  Location: [facility] ORS;  Service: Ophthalmology;  Laterality: Left;  Lot # [id number] H US:01:38.6 AP%:26.4 CDE:44.15   CATARACT EXTRACTION W/PHACO Right 06/15/2018   Procedure: CATARACT EXTRACTION PHACO AND INTRAOCULAR LENS PLACEMENT (IOC);  Surgeon: Galen Manila, MD;  Location: [facility] OR[facility];  Service: Ophthalmology;  Laterality: Right;  [place] 00:38.2 CDE 4.23 Fluid Pack Lot # [id number]   COLONOSCOPIES     COLONOSCOPY WITH PROPOFOL  N/A 10/10/2020   Procedure: COLONOSCOPY WITH PROPOFOL;  Surgeon: Regis Bill, MD;  Location: [facility] ENDOSCOPY;  Service: Endoscopy;  Laterality: N/A;   COLONOSCOPY WITH PROPOFOL N/A 11/20/2020   Procedure: COLONOSCOPY WITH PROPOFOL;  Surgeon: Regis Bill, MD;  Location: [facility] ENDOSCOPY;  Service: Endoscopy;  Laterality: N/A;  DM STAT CBC, BMP COVID POSITIVE 09/02/2020   COLONOSCOPY WITH PROPOFOL N/A 06/19/2022   Procedure: COLONOSCOPY WITH PROPOFOL;  Surgeon: Regis Bill, MD;  Location: [facility] ENDOSCOPY;  Service: Endoscopy;  Laterality: N/A;   CTR     ESOPHAGOGASTRODUODENOSCOPY (EGD) WITH PROPOFOL N/A 10/10/2020   Procedure: ESOPHAGOGASTRODUODENOSCOPY (EGD) WITH PROPOFOL;  Surgeon: Regis Bill, MD;  Location: [facility] ENDOSCOPY;  Service: Endoscopy;  Laterality: N/A;  COVID POSITIVE 10/08/2020   FLEXIBLE SIGMOIDOSCOPY N/A 02/06/2022   Procedure: FLEXIBLE SIGMOIDOSCOPY;  Surgeon: Regis Bill, MD;  Location: [facility] ENDOSCOPY;  Service: Endoscopy;  Laterality: N/A;  Patient requests anesthesia   FLEXIBLE SIGMOIDOSCOPY N/A 12/19/2022   Procedure: FLEXIBLE SIGMOIDOSCOPY;  Surgeon: Dolores Frame, MD;  Location: [facility];  Service: Gastroenterology;  Laterality: N/A;   IR ANGIOGRAM SELECTIVE EACH ADDITIONAL VESSEL  02/07/2022   IR ANGIOGRAM SELECTIVE EACH ADDITIONAL VESSEL  02/07/2022   IR ANGIOGRAM SELECTIVE EACH ADDITIONAL VESSEL  09/05/2022   IR ANGIOGRAM VISCERAL SELECTIVE  02/07/2022   IR ANGIOGRAM VISCERAL  SELECTIVE  09/05/2022   IR CV LINE INJECTION  02/24/2023   IR EMBO ART  VEN HEMORR LYMPH EXTRAV  INC GUIDE ROADMAPPING  09/05/2022   IR EMBO ART  VEN HEMORR LYMPH EXTRAV  INC GUIDE ROADMAPPING  11/05/2022   IR EMBO ARTERIAL NOT HEMORR HEMANG INC GUIDE ROADMAPPING  02/07/2022   IR EMBO TUMOR ORGAN ISCHEMIA INFARCT INC GUIDE ROADMAPPING  01/06/2023   IR IMAGING GUIDED PORT INSERTION  06/13/2022   IR IMAGING GUIDED PORT INSERTION  11/25/2022   IR PARACENTESIS  12/17/2021   IR PARACENTESIS  03/25/2022   IR PARACENTESIS  11/25/2022   IR PARACENTESIS  01/06/2023   IR PARACENTESIS  01/19/2023   IR RADIOLOGIST EVAL & MGMT  03/18/2022   IR RADIOLOGIST EVAL & MGMT  05/07/2022   IR RADIOLOGIST EVAL & MGMT  05/16/2022   IR RADIOLOGIST EVAL & MGMT  10/13/2022   IR RADIOLOGIST EVAL & MGMT  12/12/2022   IR US GUIDE VASC ACCESS RIGHT  02/07/2022   IR US GUIDE VASC ACCESS RIGHT  09/05/2022   JOINT REPLACEMENT     KNEE SURGERY Right 09/24/2017   plates and pins   PORT-A-CATH REMOVAL N/A 09/23/2022   Procedure: MINOR REMOVAL PORT-A-CATH;  Surgeon: Franky Macho, MD;  Location: [facility];  Service: General;  Laterality: N/A;   TEE WITHOUT CARDIOVERSION N/A 09/23/2022   Procedure: TRANSESOPHAGEAL ECHOCARDIOGRAM (TEE);  Surgeon: Antoine Poche, MD;  Location: [facility];  Service: Endoscopy;  Laterality: N/A;   TOTAL SHOULDER ARTHROPLASTY Right 09/27/2015   Family History Family History  Problem Relation Age of Onset   Breast cancer Mother 16   Lymphoma Mother    Diabetes Father    Kidney cancer Father    Heart disease Father    Diabetes Sister    Breast cancer Sister 38    Social History Social History   Tobacco Use   Smoking status: Never   Smokeless tobacco: Never  Vaping Use   Vaping status: Never Used  Substance Use Topics   Alcohol use: No   Drug use: Never   Allergies Latex, Tape, and Gramineae pollens  Review  of Systems Review of Systems  Constitutional:  Positive for fatigue.    Physical Exam Vital  Signs  I have reviewed the triage vital signs BP (!) 125/53   Pulse 78   Temp 99.1 F (37.3 C)   Resp 20   Ht 5\' 11"  (1.803 m)   Wt 110.9 kg   SpO2 97%   BMI 34.10 kg/m   Physical Exam Vitals and nursing note reviewed.  Constitutional:      General: She is not in acute distress.    Appearance: She is well-developed. She is ill-appearing.  HENT:     Head: Normocephalic and atraumatic.  Eyes:     Conjunctiva/sclera: Conjunctivae normal.  Cardiovascular:     Rate and Rhythm: Normal rate and regular rhythm.     Heart sounds: No murmur heard. Pulmonary:     Effort: Pulmonary effort is normal. No respiratory distress.     Breath sounds: Normal breath sounds.  Abdominal:     Palpations: Abdomen is soft.     Tenderness: There is no abdominal tenderness.  Musculoskeletal:        General: No swelling.     Cervical back: Neck supple.  Skin:    General: Skin is warm and dry.     Capillary Refill: Capillary refill takes less than 2 seconds.     Coloration: Skin is pale.  Neurological:     General: No focal deficit present.     Mental Status: She is alert. She is disoriented.  Psychiatric:        Mood and Affect: Mood normal.     ED Results and Treatments Labs (all labs ordered are listed, but only abnormal results are displayed) Labs Reviewed  CBC - Abnormal; Notable for the following components:      Result Value   RBC 2.88 (*)    Hemoglobin 6.5 (*)    HCT 20.4 (*)    MCV 70.8 (*)    MCH 22.6 (*)    RDW 22.9 (*)    All other components within normal limits  URINALYSIS, ROUTINE W REFLEX MICROSCOPIC - Abnormal; Notable for the following components:   APPearance CLOUDY (*)    Hgb urine dipstick LARGE (*)    Protein, ur 30 (*)    Leukocytes,Ua LARGE (*)    Bacteria, UA MANY (*)    All other components within normal limits  COMPREHENSIVE METABOLIC PANEL - Abnormal; Notable for the following components:   Sodium 134 (*)    Glucose, Bld 216 (*)    BUN 38 (*)     Creatinine, Ser 1.50 (*)    Calcium 8.3 (*)    Total Protein 5.3 (*)    Albumin 2.5 (*)    AST 51 (*)    Total Bilirubin 2.4 (*)    GFR, Estimated 38 (*)    All other components within normal limits  AMMONIA - Abnormal; Notable for the following components:   Ammonia 105 (*)    All other components within normal limits  PROTIME-INR - Abnormal; Notable for the following components:   Prothrombin Time 15.7 (*)    All other components within normal limits  URINE CULTURE  HIV ANTIBODY (ROUTINE TESTING W REFLEX)  HEMOGLOBIN A1C  COMPREHENSIVE METABOLIC PANEL  CBC  AMMONIA  MAGNESIUM  CBG MONITORING, ED  PREPARE RBC (CROSSMATCH)  Radiology CT Head Wo Contrast  Result Date: 03/25/2023 CLINICAL DATA:  Head trauma, minor (Age >= 65y). EXAM: CT HEAD WITHOUT CONTRAST TECHNIQUE: Contiguous axial images were obtained from the base of the skull through the vertex without intravenous contrast. RADIATION DOSE REDUCTION: This exam was performed according to the departmental dose-optimization program which includes automated exposure control, adjustment of the mA and/or kV according to patient size and/or use of iterative reconstruction technique. COMPARISON:  Head CT 09/18/2022. FINDINGS: Brain: No acute hemorrhage. Gray-white differentiation is preserved. No hydrocephalus or extra-axial collection. 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 Mild mass effect in the right anterior temporal lobe. Vascular: No hyperdense vessel or unexpected calcification. Skull: No calvarial fracture or suspicious bone lesion. Skull base is unremarkable. Sinuses/Orbits: No acute finding. Other: None. IMPRESSION: 1. No acute intracranial abnormality. 2. Unchanged 24 mm calcified right temporal meningioma. Electronically Signed   By: Orvan Falconer M.D.   On:  03/25/2023 13:30    Pertinent labs & imaging results that were available during my care of the patient were reviewed by me and considered in my medical decision making (see MDM for details).  Medications Ordered in ED Medications  0.9 %  sodium chloride infusion (Manually program via Guardrails IV Fluids) (has no administration in time range)  atorvastatin (LIPITOR) tablet 10 mg (10 mg Oral Given 03/25/23 2120)  spironolactone (ALDACTONE) tablet 50 mg (50 mg Oral Given 03/25/23 2120)  torsemide (DEMADEX) tablet 40 mg (40 mg Oral Given 03/25/23 2119)  lactulose (CHRONULAC) 10 GM/15ML solution 20 g (20 g Oral Given 03/25/23 2119)  sertraline (ZOLOFT) tablet 50 mg (50 mg Oral Given 03/25/23 2120)  gabapentin (NEURONTIN) capsule 100 mg (100 mg Oral Given 03/25/23 2120)  multivitamin with minerals tablet 1 tablet (1 tablet Oral Not Given 03/25/23 1807)  albuterol (PROVENTIL) (2.5 MG/3ML) 0.083% nebulizer solution 3 mL (has no administration in time range)  acetaminophen (TYLENOL) tablet 650 mg (650 mg Oral Given 03/25/23 2059)    Or  acetaminophen (TYLENOL) suppository 650 mg ( Rectal See Alternative 03/25/23 2059)  ondansetron (ZOFRAN) tablet 4 mg (has no administration in time range)    Or  ondansetron (ZOFRAN) injection 4 mg (has no administration in time range)  cefTRIAXone (ROCEPHIN) 2 g in sodium chloride 0.9 % 100 mL IVPB (has no administration in time range)  lactulose (CHRONULAC) 10 GM/15ML solution 30 g (30 g Oral Given 03/25/23 1430)  cefTRIAXone (ROCEPHIN) 2 g in sodium chloride 0.9 % 100 mL IVPB (0 g Intravenous Stopped 03/25/23 1713)                                                                                                                                     Procedures .Critical Care  Performed by: Glendora Score, MD Authorized by: Glendora Score, MD   Critical care provider statement:    Critical care time (minutes):  30   Critical care was necessary to treat or prevent imminent or  life-threatening deterioration of the following conditions: Symptomatic anemia requiring blood transfusion.   Critical care was time spent personally by me on the following activities:  Development of treatment plan with patient or surrogate, discussions with consultants, evaluation of patient's response to treatment, examination of patient, ordering and review of laboratory studies, ordering and review of radiographic studies, ordering and performing treatments and interventions, pulse oximetry, re-evaluation of patient's condition and review of old charts   (including critical care time)  Medical Decision Making / ED Course   This patient presents to the ED for concern of encephalopathy, fatigue, this involves an extensive number of treatment options, and is a complaint that carries with it a high risk of complications and morbidity.  The differential diagnosis includes hepatic encephalopathy, electrolyte abnormality, UTI, symptomatic anemia, intracranial bleed  MDM: Patient seen emergency room for evaluation of encephalopathy and fatigue.  Physical exam reveals a pale appearing patient that is slow to answer questions but neurologic exam otherwise unremarkable.  No focal motor or sensory deficits.  Laboratory evaluation with a BUN of 38, creatinine 1.5, albumin 2.5, hemoglobin is 6.5 with an MCV of 70.8 and packed red blood cells ordered for the patient.  Ammonia is significantly elevated at 105.  Urinalysis also concerning for infection with large leuk esterase, greater than 50 red blood cells, greater than 50 white blood cells and many bacteria.  CT head with known stable meningioma but no evidence of intracranial bleed.  Lactulose ordered as well as ceftriaxone and patient require hospital admission for UTI, symptomatic anemia and hepatic encephalopathy.  Patient then admitted.   Additional history obtained: -Additional history obtained from husband -External records from outside source  obtained and reviewed including: Chart review including previous notes, labs, imaging, consultation notes   Lab Tests: -I ordered, reviewed, and interpreted labs.   The pertinent results include:   Labs Reviewed  CBC - Abnormal; Notable for the following components:      Result Value   RBC 2.88 (*)    Hemoglobin 6.5 (*)    HCT 20.4 (*)    MCV 70.8 (*)    MCH 22.6 (*)    RDW 22.9 (*)    All other components within normal limits  URINALYSIS, ROUTINE W REFLEX MICROSCOPIC - Abnormal; Notable for the following components:   APPearance CLOUDY (*)    Hgb urine dipstick LARGE (*)    Protein, ur 30 (*)    Leukocytes,Ua LARGE (*)    Bacteria, UA MANY (*)    All other components within normal limits  COMPREHENSIVE METABOLIC PANEL - Abnormal; Notable for the following components:   Sodium 134 (*)    Glucose, Bld 216 (*)    BUN 38 (*)    Creatinine, Ser 1.50 (*)    Calcium 8.3 (*)    Total Protein 5.3 (*)    Albumin 2.5 (*)    AST 51 (*)    Total Bilirubin 2.4 (*)    GFR, Estimated 38 (*)    All other components within normal limits  AMMONIA - Abnormal; Notable for the following components:   Ammonia 105 (*)    All other components within normal limits  PROTIME-INR - Abnormal; Notable for the following components:   Prothrombin Time 15.7 (*)    All other components within normal limits  URINE CULTURE  HIV ANTIBODY (ROUTINE TESTING W REFLEX)  HEMOGLOBIN A1C  COMPREHENSIVE METABOLIC PANEL  CBC  AMMONIA  MAGNESIUM  CBG MONITORING, ED  PREPARE RBC (CROSSMATCH)      EKG   EKG Interpretation Date/Time:    Ventricular Rate:    PR Interval:    QRS Duration:    QT Interval:    QTC Calculation:   R Axis:      Text Interpretation:           Imaging Studies ordered: I ordered imaging studies including CT head I independently visualized and interpreted imaging. I agree with the radiologist interpretation   Medicines ordered and prescription drug management: Meds  ordered this encounter  Medications   lactulose (CHRONULAC) 10 GM/15ML solution 30 g   0.9 %  sodium chloride infusion (Manually program via Guardrails IV Fluids)   cefTRIAXone (ROCEPHIN) 2 g in sodium chloride 0.9 % 100 mL IVPB    Order Specific Question:   Antibiotic Indication:    Answer:   UTI   atorvastatin (LIPITOR) tablet 10 mg   spironolactone (ALDACTONE) tablet 50 mg   torsemide (DEMADEX) tablet 40 mg   lactulose (CHRONULAC) 10 GM/15ML solution 20 g   sertraline (ZOLOFT) tablet 50 mg   gabapentin (NEURONTIN) capsule 100 mg   multivitamin with minerals tablet 1 tablet   albuterol (PROVENTIL) (2.5 MG/3ML) 0.083% nebulizer solution 3 mL   OR Linked Order Group    acetaminophen (TYLENOL) tablet 650 mg    acetaminophen (TYLENOL) suppository 650 mg   OR Linked Order Group    ondansetron (ZOFRAN) tablet 4 mg    ondansetron (ZOFRAN) injection 4 mg   cefTRIAXone (ROCEPHIN) 2 g in sodium chloride 0.9 % 100 mL IVPB    Order Specific Question:   Antibiotic Indication:    Answer:   UTI    -I have reviewed the patients home medicines and have made adjustments as needed  Critical interventions Blood transfusion, antibiotics, lactulose    Cardiac Monitoring: The patient was maintained on a cardiac monitor.  I personally viewed and interpreted the cardiac monitored which showed an underlying rhythm of: NSR  Social Determinants of Health:  Factors impacting patients care include: none   Reevaluation: After the interventions noted above, I reevaluated the patient and found that they have :improved  Co morbidities that complicate the patient evaluation  Past Medical History:  Diagnosis Date   Anemia in chronic kidney disease (CKD) 02/07/2022   Anxiety    Arthritis    Ascites of liver    Weekly paracentesis   Cirrhosis of liver (HCC)    CKD (chronic kidney disease) stage 4, GFR 15-29 ml/min (HCC)    GERD (gastroesophageal reflux disease)    Grade IV internal hemorrhoids     Hypertension    NASH (nonalcoholic steatohepatitis)    Neuropathy, diabetic (HCC)    Obesity (BMI 30-39.9)    OSA on CPAP    Pneumonia    Portal hypertension (HCC)    S/P TIPS (transjugular intrahepatic portosystemic shunt)    Thalassemia minor 1992      Dispostion: I considered admission for this patient, and given symptomatic anemia requiring blood transfusion, UTI and hepatic encephalopathy patient require hospital admission     Final Clinical Impression(s) / ED Diagnoses Final diagnoses:  Anemia, unspecified type  Hepatic encephalopathy (HCC)  Acute cystitis without hematuria     @PCDICTATION @    Glendora Score, MD 03/25/23 2158

## 2023-03-25 NOTE — ED Triage Notes (Signed)
Pt presents with ground level fall, per husband pt was standing up getting out of bed and fell down on her bottom, pt has been fatigue since yesterday, was coming in today for transfusion for Thalassemia.

## 2023-03-25 NOTE — Hospital Course (Addendum)
67 year old female with history of DM2, CKD 3b, [name] liver cirrhosis with refractory ascites status post TIPS creation (s/p revision x2), thalassemia minor, significant rectal bleeding status post superior rectal artery embolization x 2 on 02/07/22 and 11/05/22, with recurrent grade 4 internal hemorrhoids and coil embolization of the splenic artery 01/06/2023 presenting with lethargy and a mechanical fall at ground-level.  The patient went to radiology for her usual scheduled paracentesis on 03/24/2023.  Ultrasound showed there was insufficient ascites for paracentesis.  Spouse states that the patient became increasingly somnolent after she went home.  In the morning of 03/25/2023, the patient was try to get out of bed and fell onto her buttock.  There is no loss of consciousness.  When she woke up in the morning of 03/25/2023, the patient continued to have lethargy.  As a result, the patient was brought to the emergency department for further evaluation and treatment. History is supplemented by the patient's spouse.  The patient has not had any fevers, chills, chest pain, shortness breath, nausea, vomiting.  She has some loose stools.  She has had a scant amount of " pink on the toilet paper".  She denies abdominal pain or dysuria.  She denies any headache or neck pain.  The patient states that she has not been taking her lactulose as directed.  She has been taking it once a day rather than the usual 3 times daily. In the ED, the patient had low-grade temperature 99.5 F.  She was hemodynamically stable with oxygen saturation 98% on room air.  WBC 5.9, hemoglobin 6.5, platelets 241.  Sodium 134, potassium 3.8, bicarbonate 22, serum creatinine 1.50.  AST 51, ALT 27, alk phosphatase 117, total bilirubin 2.4.  Ammonia 105.  EKG shows sinus rhythm with no ST ST changes.  CT of the brain was negative for any acute findings.  There is an unchanged meningioma.  UA  > 50 WBC. the patient was started on lactulose and  ceftriaxone.  Most recently, the patient was admitted to the hospital from 01/06/2023 to 01/19/2023 secondary to a fever after her splenic artery embolization.  Her cultures were negative.  The patient was started on empiric Zosyn and transition to Augmentin and finished a week course of antibiotics.  She was transfused 1 unit PRBC during that hospitalization.  It was felt that her fever was secondary to transfusion reaction and splenic infarct.  She was subsequently transition to CIR from 01/19/2023 to 01/26/2023.

## 2023-03-26 ENCOUNTER — Encounter (HOSPITAL_COMMUNITY): Payer: Self-pay | Admitting: Internal Medicine

## 2023-03-26 ENCOUNTER — Ambulatory Visit (HOSPITAL_COMMUNITY): Payer: Medicare Other

## 2023-03-26 ENCOUNTER — Encounter (HOSPITAL_COMMUNITY): Payer: 59 | Admitting: Occupational Therapy

## 2023-03-26 DIAGNOSIS — N3000 Acute cystitis without hematuria: Secondary | ICD-10-CM

## 2023-03-26 DIAGNOSIS — K7581 Nonalcoholic steatohepatitis (NASH): Secondary | ICD-10-CM | POA: Diagnosis not present

## 2023-03-26 DIAGNOSIS — K7682 Hepatic encephalopathy: Secondary | ICD-10-CM | POA: Diagnosis not present

## 2023-03-26 DIAGNOSIS — D649 Anemia, unspecified: Secondary | ICD-10-CM | POA: Diagnosis not present

## 2023-03-26 MED ORDER — CHLORHEXIDINE GLUCONATE CLOTH 2 % EX PADS
6.0000 | MEDICATED_PAD | Freq: Every day | CUTANEOUS | Status: DC
  Administered 2023-03-26 – 2023-03-27 (×2): 6 via TOPICAL

## 2023-03-26 MED ORDER — LACTULOSE 10 GM/15ML PO SOLN
20.0000 g | Freq: Two times a day (BID) | ORAL | Status: DC
  Administered 2023-03-27: 20 g via ORAL
  Filled 2023-03-26 (×2): qty 30

## 2023-03-26 NOTE — Plan of Care (Signed)

## 2023-03-26 NOTE — Progress Notes (Signed)
Pt alert and oriented x 1 at beginning of shift and had delayed responsiveness. She drifted in and out of sleep during conversation and could not answer questions. Pt is currently alert and oriented x 4 and answering all questions appropriately.   Pt received 2 units RBC without complications. HGB up to 7.5 this a.m. Pt has had multiple bowel movements during the night after receiving scheduled lactulose. No c/o pain or discomfort verbalized during the night.

## 2023-03-26 NOTE — Progress Notes (Signed)
PROGRESS NOTE  Rachel Huber ZOX:[id number] DOB: 1956/07/27 DOA: 03/25/2023 PCP: Lauro Regulus, MD  Brief History:  67 year old female with history of DM2, CKD 3b, [name] liver cirrhosis with refractory ascites status post TIPS creation (s/p revision x2), thalassemia minor, significant rectal bleeding status post superior rectal artery embolization x 2 on 02/07/22 and 11/05/22, with recurrent grade 4 internal hemorrhoids and coil embolization of the splenic artery 01/06/2023 presenting with lethargy and a mechanical fall at ground-level.  The patient went to radiology for her usual scheduled paracentesis on 03/24/2023.  Ultrasound showed there was insufficient ascites for paracentesis.  Spouse states that the patient became increasingly somnolent after she went home.  In the morning of 03/25/2023, the patient was try to get out of bed and fell onto her buttock.  There is no loss of consciousness.  When she woke up in the morning of 03/25/2023, the patient continued to have lethargy.  As a result, the patient was brought to the emergency department for further evaluation and treatment. History is supplemented by the patient's spouse.  The patient has not had any fevers, chills, chest pain, shortness breath, nausea, vomiting.  She has some loose stools.  She has had a scant amount of " pink on the toilet paper".  She denies abdominal pain or dysuria.  She denies any headache or neck pain.  The patient states that she has not been taking her lactulose as directed.  She has been taking it once a day rather than the usual 3 times daily. In the ED, the patient had low-grade temperature 99.5 F.  She was hemodynamically stable with oxygen saturation 98% on room air.  WBC 5.9, hemoglobin 6.5, platelets 241.  Sodium 134, potassium 3.8, bicarbonate 22, serum creatinine 1.50.  AST 51, ALT 27, alk phosphatase 117, total bilirubin 2.4.  Ammonia 105.  EKG shows sinus rhythm with no ST ST changes.  CT of the brain was  negative for any acute findings.  There is an unchanged meningioma.  UA  > 50 WBC. the patient was started on lactulose and ceftriaxone.  Most recently, the patient was admitted to the hospital from 01/06/2023 to 01/19/2023 secondary to a fever after her splenic artery embolization.  Her cultures were negative.  The patient was started on empiric Zosyn and transition to Augmentin and finished a week course of antibiotics.  She was transfused 1 unit PRBC during that hospitalization.  It was felt that her fever was secondary to transfusion reaction and splenic infarct.  She was subsequently transition to CIR from 01/19/2023 to 01/26/2023.   Assessment/Plan: Acute metabolic encephalopathy/hepatic encephalopathy -Multifactorial including hepatic encephalopathy, UTI, symptomatic anemia -Started lactulose 20 g 3 times daily>>6 BMs -Repeat ammonia--105>>98 -UA >50WBC -CT brain negative for acute findings -Patient states that she has been poorly compliant with lactulose -8/8--mental status is improving -decrease lactulose to bid and titrate for 2-3BMs daily  UTI -UA>50WBC -urine culture = GNR   Decompensated NASH Liver cirrhosis -Restart torsemide spironolactone -Lactulose as discussed above -MELD 18 at time of admission   Symptomatic anemia/Hemorrhoidal bleed -Hemoglobin trended down to 6.5 on the day of admission -Transfused 2 units PRBC on 8/7 -Baseline hemoglobin 7-8 -S/P partial splenic embolization on 5/21 by IR.  -overall rectal bleeding now very scant to none   CKD stage IIIb -Baseline creatinine 1.3-1.6 -Monitor renal function   Hypotension -Continue midodrine on paracentesis status   Type 2 DM with renal complications  Diet Controlled. Most recent A1c was 5.5%  -07/01/2022 hemoglobin A1c 5.5 -03/25/23 A1c--6.8   Depression with anxiety -Continue home sertraline    Subjective:  Patient denies fevers, chills, headache, chest pain, dyspnea, nausea, vomiting, diarrhea,  abdominal pain, dysuria, hematuria, hematochezia, and melena.  Objective: Vitals:   03/25/23 2111 03/25/23 2320 03/26/23 0303 03/26/23 1414  BP: (!) 125/53 (!) 115/49 (!) 120/53 (!) 122/57  Pulse: 78 75 74 67  Resp: 20 16 18 18   Temp: 99.1 F (37.3 C) 99.9 F (37.7 C) 98.6 F (37 C) 98.4 F (36.9 C)  TempSrc:  Oral Oral Oral  SpO2:   95% 100%  Weight:      Height:        Intake/Output Summary (Last 24 hours) at 03/26/2023 1748 Last data filed at 03/26/2023 1543 Gross per 24 hour  Intake 1042 ml  Output 600 ml  Net 442 ml   Weight change:  Exam:  General:  Pt is alert, follows commands appropriately, not in acute distress HEENT: No icterus, No thrush, No neck mass, [place]/AT Cardiovascular: RRR, S1/S2, no rubs, no gallops Respiratory: CTA bilaterally, no wheezing, no crackles, no rhonchi Abdomen: Soft/+BS, non tender, non distended, no guarding Extremities: No edema, No lymphangitis, No petechiae, No rashes, no synovitis   Data Reviewed: I have personally reviewed following labs and imaging studies Basic Metabolic Panel: Recent Labs  Lab 03/25/23 1248 03/26/23 0405  NA 134* 136  K 3.8 3.7  CL 105 105  CO2 22 23  GLUCOSE 216* 146*  BUN 38* 36*  CREATININE 1.50* 1.36*  CALCIUM 8.3* 8.2*  MG  --  1.8   Liver Function Tests: Recent Labs  Lab 03/25/23 1248 03/26/23 0405  AST 51* 44*  ALT 27 26  ALKPHOS 117 106  BILITOT 2.4* 2.2*  PROT 5.3* 5.2*  ALBUMIN 2.5* 2.4*   No results for input(s): "LIPASE", "AMYLASE" in the last 168 hours. Recent Labs  Lab 03/25/23 1248 03/26/23 0405  AMMONIA 105* 98*   Coagulation Profile: Recent Labs  Lab 03/25/23 1248  INR 1.2   CBC: Recent Labs  Lab 03/23/23 1414 03/25/23 1248 03/26/23 0405  WBC 3.9* 5.9 4.4  HGB 6.3* 6.5* 7.5*  HCT 21.0* 20.4* 23.6*  MCV 71.9* 70.8* 73.3*  PLT 231 241 193   Cardiac Enzymes: No results for input(s): "CKTOTAL", "CKMB", "CKMBINDEX", "TROPONINI" in the last 168  hours. BNP: Invalid input(s): "POCBNP" CBG: No results for input(s): "GLUCAP" in the last 168 hours. HbA1C: Recent Labs    03/25/23 1248  HGBA1C 6.8*   Urine analysis:    Component Value Date/Time   COLORURINE YELLOW 03/25/2023 1248   APPEARANCEUR CLOUDY (A) 03/25/2023 1248   LABSPEC 1.012 03/25/2023 1248   PHURINE 5.0 03/25/2023 1248   GLUCOSEU NEGATIVE 03/25/2023 1248   HGBUR LARGE (A) 03/25/2023 1248   BILIRUBINUR NEGATIVE 03/25/2023 1248   KETONESUR NEGATIVE 03/25/2023 1248   PROTEINUR 30 (A) 03/25/2023 1248   NITRITE NEGATIVE 03/25/2023 1248   LEUKOCYTESUR LARGE (A) 03/25/2023 1248   Sepsis Labs: @LABRCNTIP (procalcitonin:4,lacticidven:4) ) Recent Results (from the past 240 hour(s))  Urine Culture (for pregnant, neutropenic or urologic patients or patients with an indwelling urinary catheter)     Status: Abnormal (Preliminary result)   Collection Time: 03/25/23  3:26 PM   Specimen: Urine, Clean Catch  Result Value Ref Range Status   Specimen Description   Final    URINE, CLEAN CATCH Performed at [facility], [address] [place], [place] [address]  Special Requests   Final    NONE Performed at [facility], [address], [place], [place] [address]    Culture (A)  Final    >=100,000 COLONIES/mL GRAM NEGATIVE RODS IDENTIFICATION AND SUSCEPTIBILITIES TO FOLLOW Performed at [facility], [address], [place], [place] [address]    Report Status PENDING  Incomplete     Scheduled Meds:  sodium chloride   Intravenous Once   atorvastatin  10 mg Oral Daily   Chlorhexidine Gluconate Cloth  6 each Topical Daily   gabapentin  100 mg Oral BID   lactulose  20 g Oral TID   [START ON 03/27/2023] lactulose  20 g Oral BID   multivitamin with minerals  1 tablet Oral Daily   sertraline  50 mg Oral Daily   spironolactone  50 mg Oral Daily   torsemide  40 mg Oral Daily   Continuous Infusions:  cefTRIAXone (ROCEPHIN)  IV 2 g (03/26/23 1243)     Procedures/Studies: CT Head Wo Contrast  Result Date: 03/25/2023 CLINICAL DATA:  Head trauma, minor (Age >= 65y). EXAM: CT HEAD WITHOUT CONTRAST TECHNIQUE: Contiguous axial images were obtained from the base of the skull through the vertex without intravenous contrast. RADIATION DOSE REDUCTION: This exam was performed according to the departmental dose-optimization program which includes automated exposure control, adjustment of the mA and/or kV according to patient size and/or use of iterative reconstruction technique. COMPARISON:  Head CT 09/18/2022. FINDINGS: Brain: No acute hemorrhage. Gray-white differentiation is preserved. No hydrocephalus or extra-axial collection. 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 Mild mass effect in the right anterior temporal lobe. Vascular: No hyperdense vessel or unexpected calcification. Skull: No calvarial fracture or suspicious bone lesion. Skull base is unremarkable. Sinuses/Orbits: No acute finding. Other: None. IMPRESSION: 1. No acute intracranial abnormality. 2. Unchanged 24 mm calcified right temporal meningioma. Electronically Signed   By: Orvan Falconer M.D.   On: 03/25/2023 13:30   [place] ASCITES (ABDOMEN LIMITED)  Result Date: 03/24/2023 CLINICAL DATA:  History of cirrhosis and ascites requiring paracentesis procedures. Recent weekly interval for paracentesis. EXAM: LIMITED ABDOMEN ULTRASOUND FOR ASCITES TECHNIQUE: Limited ultrasound survey for ascites was performed in all four abdominal quadrants. COMPARISON:  Most recent paracentesis procedure on 03/17/2023 FINDINGS: Four quadrant survey of the peritoneal cavity does not show a large amount of ascites and paracentesis was deferred today. Last procedure yielded 2.65 L and the procedure before that yielded 4.6 L which followed a 2 week interval. Decision was made to defer paracentesis for another week. Paracentesis interval likely can be performed every 2 weeks  currently based on more recent frequency and fluid volumes. IMPRESSION: Relatively small volume of ascites today. Paracentesis deferred today. Paracentesis interval likely can be performed every 2 weeks currently. Procedure will be deferred for another week. Electronically Signed   By: Irish Lack M.D.   On: 03/24/2023 12:07   CT ANGIO ABD/PELVIS BRTO  Addendum Date: 03/18/2023   ADDENDUM REPORT: 03/18/2023 16:13 ADDENDUM: These results were sent by spark to Dr. Leighton Parody by Ronne Binning on7/31/2024 at 4 pm, who acknowledged these results. Electronically Signed   By: Darliss Cheney M.D.   On: 03/18/2023 16:13   Result Date: 03/18/2023 CLINICAL DATA:  Liver cirrhosis secondary to NASH. Splenic sequestration. History of splenic artery branch embolization. EXAM: CTA ABDOMEN AND PELVIS WITHOUT AND WITH CONTRAST TECHNIQUE: Multidetector CT imaging of the abdomen and pelvis was performed using the standard protocol during bolus  administration of intravenous contrast. Multiplanar reconstructed images and MIPs were obtained and reviewed to evaluate the vascular anatomy. RADIATION DOSE REDUCTION: This exam was performed according to the departmental dose-optimization program which includes automated exposure control, adjustment of the mA and/or kV according to patient size and/or use of iterative reconstruction technique. CONTRAST:  75mL OMNIPAQUE IOHEXOL 350 MG/ML SOLN COMPARISON:  CT abdomen and pelvis without contrast 01/16/2023. FINDINGS: VASCULAR Aorta: Normal caliber aorta without aneurysm, dissection, vasculitis or significant stenosis. There is moderate calcified atherosclerotic disease throughout the aorta. Celiac: Celiac artery and branches appear patent. Embolic coils are seen at the level of the splenic hilum, unchanged. No aneurysm or dissection. SMA: Patent without evidence of aneurysm, dissection, vasculitis or significant stenosis. Renals: Both renal arteries are patent without evidence of aneurysm,  dissection, vasculitis, fibromuscular dysplasia or significant stenosis. IMA: Patent without evidence of aneurysm, dissection, vasculitis or significant stenosis. Inflow: Patent without evidence of aneurysm, dissection, vasculitis or significant stenosis. Proximal Outflow: Bilateral common femoral and visualized portions of the superficial and profunda femoral arteries are patent without evidence of aneurysm, dissection, vasculitis or significant stenosis. There is a surgical clip adjacent to the right common femoral artery. Veins: Main portal vein and splenic vein appear enlarged, but grossly patent. Superior mesenteric vein grossly patent. IVC not well opacified. Can not confirm patency of tip shunt secondary to timing of the contrast bolus. Multiple varices are seen in the anterior abdomen. Review of the MIP images confirms the above findings. NON-VASCULAR Lower chest: There are findings suspicious for right lower lobe segmental pulmonary embolism on image 5/7. Lung bases are otherwise clear. Hepatobiliary: Gallstones and gallbladder sludge are present. There is nodular liver contour compatible with cirrhosis, unchanged. No focal liver lesions are identified. Pancreas: Unremarkable. No pancreatic ductal dilatation or surrounding inflammatory changes. 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 The spleen is mildly enlarged, unchanged. Adrenals/Urinary Tract: Adrenal glands are unremarkable. Kidneys are normal, without renal calculi, focal lesion, or hydronephrosis. Bladder is unremarkable. Stomach/Bowel: Stomach is within normal limits. Appendix appears normal. No evidence of bowel wall thickening, distention, or inflammatory changes. The appendix is not visualized. Lymphatic: There are enlarged bilateral inguinal lymph nodes measuring up to 1.5 cm short axis. There are no enlarged intra-abdominal or pelvic lymph nodes identified. Reproductive: Status post hysterectomy. No adnexal  masses. Other: There is small volume ascites. There is diffuse body wall edema. There is a small umbilical hernia containing fluid. There surgical clips or coils in the posterior pelvis, unchanged. Musculoskeletal: Multilevel degenerative changes affect the spine. IMPRESSION: 1. Findings suspicious for right lower lobe segmental pulmonary embolism. 2. Stable findings of cirrhosis and portal hypertension. 3. IVC not well opacified. Can not confirm patency of the tip shunt secondary to timing of the contrast bolus. 4. Stable findings of splenic artery embolization. Aortic Atherosclerosis (ICD10-I70.0). NON-VASCULAR 1. Stable splenic infarcts. 2. Stable small volume ascites. 3. Diffuse body wall edema. 4. Cholelithiasis. 5. Bilateral inguinal lymphadenopathy. Electronically Signed: By: Darliss Cheney M.D. On: 03/18/2023 15:50   US Paracentesis  Result Date: 03/17/2023 INDICATION: Patient with a history of cirrhosis with recurrent ascites. Interventional radiology asked to perform a therapeutic paracentesis. Patient is status post Tips and Tips revision x 2 at [facility]. She is also status post partial splenic embolization. EXAM: ULTRASOUND GUIDED PARACENTESIS MEDICATIONS: 1% lidocaine 30 mL COMPLICATIONS: None immediate. PROCEDURE: Informed written consent was obtained from the patient after a discussion of the risks, benefits and alternatives to treatment. A timeout was performed prior  to the initiation of the procedure. Initial ultrasound scanning demonstrates a large amount of ascites within the right lower abdominal quadrant. The right lower abdomen was prepped and draped in the usual sterile fashion. 1% lidocaine was used for local anesthesia. Following this, a 19 gauge, 10-cm, Yueh catheter was introduced. An ultrasound image was saved for documentation purposes. The paracentesis was performed. The catheter was removed and a dressing was applied. The patient tolerated the procedure well without immediate post  procedural complication. FINDINGS: A total of approximately 2.65 L of clear yellow fluid was removed. IMPRESSION: Successful ultrasound-guided paracentesis yielding 2.65 liters of peritoneal fluid. Procedure performed by Alwyn Ren NP and supervised by Dr. Loreta Ave. Electronically Signed   By: Gilmer Mor D.O.   On: 03/17/2023 13:14   US Paracentesis  Result Date: 03/10/2023 INDICATION: Cirrhosis and ascites. EXAM: ULTRASOUND GUIDED PARACENTESIS MEDICATIONS: None. COMPLICATIONS: None immediate. PROCEDURE: Informed written consent was obtained from the patient after a discussion of the risks, benefits and alternatives to treatment. A timeout was performed prior to the initiation of the procedure. Initial ultrasound was performed to localize ascites. The right lower abdomen was prepped and draped in the usual sterile fashion. 1% lidocaine was used for local anesthesia. Following this, a 6 French Safe-T-Centesis catheter was introduced. An ultrasound image was saved for documentation purposes. The paracentesis was performed. The catheter was removed and a dressing was applied. The patient tolerated the procedure well without immediate post procedural complication. Patient received post-procedure intravenous albumin; see nursing notes for details. FINDINGS: A total of approximately 4.6 L of dark yellow/amber colored fluid was removed. IMPRESSION: Successful ultrasound-guided paracentesis yielding 4.6 liters of peritoneal fluid. Electronically Signed   By: Irish Lack M.D.   On: 03/10/2023 12:49    Catarina Hartshorn, DO  [facility]  If 7PM-7AM, please contact night-coverage [email or web address] Password [email or web address] 03/26/2023, 5:48 PM   LOS: 1 day

## 2023-03-26 NOTE — TOC Initial Note (Signed)
Transition of Care [facility]) - Initial/Assessment Note    Patient Details  Name: Rachel Huber MRN: [id number] Date of Birth: 04-07-1956  Transition of Care [facility]) CM/SW Contact:    Annice Needy, LCSW Phone Number: 03/26/2023, 11:15 AM  Clinical Narrative:                 Patient from home with spouse. Independent with ADLs at baseline. Patient has cane, walker, over toilet riser, shower chair. She ambulates with a cane and a walker. Patient's husband transports to medical appointments.   Expected Discharge Plan: Home/Self Care Barriers to Discharge: Continued Medical Work up   Patient Goals and CMS Choice            Expected Discharge Plan and Services                                              Prior Living Arrangements/Services   Lives with:: Spouse Patient language and need for interpreter reviewed:: Yes        Need for Family Participation in Patient Care: Yes (Comment) Care giver support system in place?: Yes (comment) Current home services: DME (cane, walker, over toilet riser, shower chair) Criminal Activity/Legal Involvement Pertinent to Current Situation/Hospitalization: No - Comment as needed  Activities of Daily Living Home Assistive Devices/Equipment: Cane (specify quad or straight) ADL Screening (condition at time of admission) Patient's cognitive ability adequate to safely complete daily activities?: Yes Is the patient deaf or have difficulty hearing?: No Does the patient have difficulty seeing, even when wearing glasses/contacts?: No Does the patient have difficulty concentrating, remembering, or making decisions?: No Patient able to express need for assistance with ADLs?: Yes Does the patient have difficulty dressing or bathing?: No Independently performs ADLs?: Yes (appropriate for developmental age) Does the patient have difficulty walking or climbing stairs?: No Weakness of Legs: Both Weakness of Arms/Hands: None  Permission  Sought/Granted Permission sought to share information with : Family Supports    Share Information with NAME: Mr. 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   Symptomatic anemia 12/17/2022   Hypokalemia 11/06/2022   Acute on chronic anemia 11/03/2022   Hypotension 11/03/2022   AKI (acute kidney injury) (HCC) 10/15/2022   Rectal bleeding 06/17/2022   Obesity (BMI 30-39.9) 06/17/2022   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 mellitus (HCC) 06/17/2022   Grade IV internal hemorrhoids 05/16/2022   Thrombocytopenia (HCC) 03/05/2022   Anemia in chronic kidney disease (CKD) 02/07/2022   GI bleeding 02/04/2022   Chronic kidney disease, stage 3b (HCC) 02/04/2022   Depression with anxiety 02/04/2022   Liver cirrhosis secondary to NASH (HCC)    Insomnia 01/27/2022   Iron deficiency anemia due to chronic blood loss 02/13/2021   History of uterine cancer 02/13/2021   Decompensated hepatic cirrhosis (HCC) 10/25/2020  OSA on CPAP 10/25/2020   Acute respiratory failure with hypoxia (HCC) 09/10/2020   Closed right hip fracture (HCC) 09/02/2020   Type 2 diabetes mellitus with hyperlipidemia (HCC) 09/02/2020   Thalassemia minor    HLD (hyperlipidemia)    Acute kidney injury superimposed on CKD [facility])    Depression    PCP:  Lauro Regulus, MD Pharmacy:   [facility] [address] [place] - [address] [place] [address] Phone: [phone] Fax: [phone]     Social Determinants of Health (SDOH) Social History: SDOH Screenings   Food Insecurity: Patient Declined (03/25/2023)  Housing: Patient Declined (03/25/2023)  Transportation Needs: Patient Declined (03/25/2023)  Utilities: Patient Declined (03/25/2023)  Alcohol Screen: Low Risk  (04/04/2022)  Depression (PHQ2-9): Low Risk  (02/17/2023)  Financial Resource Strain: Low Risk  (04/04/2022)  Physical Activity: Inactive (04/04/2022)  Social Connections: Moderately Isolated (04/04/2022)  Stress: No Stress Concern Present (04/04/2022)  Tobacco Use: Low Risk  (03/19/2023)   SDOH Interventions:     Readmission Risk Interventions    12/19/2022   10:27 AM 11/04/2022   11:32 AM 10/17/2022    9:17 AM  Readmission Risk Prevention Plan  Transportation Screening Complete Complete Complete  Medication Review Oceanographer) Complete Complete Complete  PCP or Specialist appointment within 3-5 days of discharge Complete Complete   HRI or Home Care Consult Complete  Complete  SW Recovery Care/Counseling Consult Complete Complete Complete  Palliative Care Screening Not Applicable Not Applicable Not Applicable  Skilled Nursing Facility Not Applicable Not Applicable Not Applicable

## 2023-03-27 DIAGNOSIS — N3001 Acute cystitis with hematuria: Secondary | ICD-10-CM | POA: Diagnosis not present

## 2023-03-27 DIAGNOSIS — K7682 Hepatic encephalopathy: Secondary | ICD-10-CM | POA: Diagnosis not present

## 2023-03-27 DIAGNOSIS — K7581 Nonalcoholic steatohepatitis (NASH): Secondary | ICD-10-CM | POA: Diagnosis not present

## 2023-03-27 DIAGNOSIS — G9341 Metabolic encephalopathy: Secondary | ICD-10-CM | POA: Diagnosis not present

## 2023-03-27 LAB — AMMONIA: Ammonia: 41 umol/L — ABNORMAL HIGH (ref 9–35)

## 2023-03-27 MED ORDER — CIPROFLOXACIN HCL 500 MG PO TABS: 500.0000 mg | ORAL_TABLET | Freq: Every day | ORAL | Status: DC

## 2023-03-27 MED ORDER — HEPARIN SOD (PORK) LOCK FLUSH 100 UNIT/ML IV SOLN
500.0000 [IU] | Freq: Once | INTRAVENOUS | Status: AC
  Administered 2023-03-27: 500 [IU] via INTRAVENOUS
  Filled 2023-03-27: qty 5

## 2023-03-27 MED ORDER — CEFDINIR 300 MG PO CAPS
300.0000 mg | ORAL_CAPSULE | Freq: Two times a day (BID) | ORAL | Status: DC
  Administered 2023-03-27: 300 mg via ORAL
  Filled 2023-03-27: qty 1

## 2023-03-27 MED ORDER — CEFDINIR 300 MG PO CAPS: 300.0000 mg | ORAL_CAPSULE | Freq: Two times a day (BID) | ORAL | 0 refills | Status: DC

## 2023-03-27 NOTE — Care Management Important Message (Signed)
Important Message  Patient Details  Name: Rachel Huber MRN: [id number] Date of Birth: Feb 18, 1956   Medicare Important Message Given:  Yes     Corey Harold 03/27/2023, 10:43 AM

## 2023-03-27 NOTE — Plan of Care (Signed)

## 2023-03-27 NOTE — Evaluation (Signed)
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  The patient went to radiology for her usual scheduled paracentesis on 03/24/2023.  Ultrasound showed there was insufficient ascites for paracentesis.  Spouse states that the patient became increasingly somnolent after she went home.  In the morning of 03/25/2023, the patient was try to get out of bed and fell onto her buttock.  There is no loss of consciousness.  When she woke up in the morning of 03/25/2023, the patient continued to have lethargy.  As a result, the patient was brought to the emergency department for further evaluation and treatment.  History is supplemented by the patient's spouse.  The patient has not had any fevers, chills, chest pain, shortness breath, nausea, vomiting.  She has some loose stools.  She has had a scant amount of " pink on the toilet paper".  She denies abdominal pain or dysuria.  She denies any headache or neck pain.  The patient states that she has not been taking her lactulose as directed.  She has been taking it once a day rather than the usual 3 times daily.  In the ED, the patient had low-grade temperature 99.5 F.  She was hemodynamically stable with oxygen saturation 98% on room air.  WBC 5.9, hemoglobin 6.5, platelets 241.  Sodium 134, potassium 3.8, bicarbonate 22, serum creatinine 1.50.  AST 51, ALT 27, alk phosphatase 117, total bilirubin 2.4.  Ammonia 105.  EKG shows sinus rhythm with no ST ST changes.  CT of  the brain was negative for any acute findings.  There is an unchanged meningioma.  UA  > 50 WBC. the patient was started on lactulose and ceftriaxone.   Clinical Impression  Pt tolerated treatment well. PLOF included independently dressing herself but needing some help bathing, and cleaning. Pt no longer drives. Pt required supervision during ambulation with SPC. Pt will continue outpatient PT upon discharge from hospital. Patient discharged to care of nursing for ambulation daily as tolerated for length of stay.       If plan is discharge home, recommend the following: A little help with walking and/or transfers;A little help with bathing/dressing/bathroom;Assistance with cooking/housework;Assist for transportation;Help with stairs or ramp for entrance   Can travel by private vehicle        Equipment Recommendations    Recommendations for Other Services       Functional Status Assessment Patient has had a recent decline in their functional status and demonstrates the ability to make significant improvements in function in a reasonable and predictable amount of time.     Precautions / Restrictions Precautions Precautions: Fall Restrictions Weight Bearing Restrictions: No      Mobility  Bed Mobility Overal bed mobility: Modified Independent             General bed mobility comments: increased time but functional    Transfers Overall transfer level: Needs assistance Equipment used: Straight cane Transfers: Sit to/from Stand, Bed to chair/wheelchair/BSC Sit to Stand: Supervision   Step pivot transfers: Supervision       General transfer  comment: Use of cane when transferring    Ambulation/Gait Ambulation/Gait assistance: Supervision Gait Distance (Feet): 200 Feet Assistive device: Straight cane Gait Pattern/deviations: Step-through pattern, Decreased step length - right, Decreased step length - left, WFL(Within Functional Limits) Gait velocity: decreased but  functional     General Gait Details: functional  Stairs            Wheelchair Mobility     Tilt Bed    Modified Rankin (Stroke Patients Only)       Balance Overall balance assessment: Needs assistance Sitting-balance support: No upper extremity supported, Feet supported Sitting balance-Leahy Scale: Good Sitting balance - Comments: sitting EOB   Standing balance support: Single extremity supported, During functional activity, Reliant on assistive device for balance Standing balance-Leahy Scale: Fair Standing balance comment: fair using SPC                             Pertinent Vitals/Pain Pain Assessment Pain Assessment: No/denies pain    Home Living Family/patient expects to be discharged to:: Private residence Living Arrangements: Spouse/significant other Available Help at Discharge: Family;Available 24 hours/day Type of Home: House Home Access: Stairs to enter Entrance Stairs-Rails: None Entrance Stairs-Number of Steps: 1 + 1 front door   Home Layout: One level Home Equipment: Agricultural consultant (2 wheels);Rollator (4 wheels);Cane - single point;Tub bench;Hand held shower head;Grab bars - tub/shower;Grab bars - toilet Additional Comments: Pt states she uses both the walker and SPC inside home but in different areas. Dan Humphreys is used for longer distances    Prior Function Prior Level of Function : Needs assist       Physical Assist : Mobility (physical);ADLs (physical) Mobility (physical): Stairs ADLs (physical): Bathing;Grooming Mobility Comments: Household ambulator ADLs Comments: Pt states she is independent dressing but needs assistance when bathing and cleaning. Pt does not drive     Extremity/Trunk Assessment                Communication   Communication Communication: No apparent difficulties Cueing Techniques: Verbal cues;Tactile cues  Cognition Arousal: Alert Behavior During Therapy: WFL for tasks assessed/performed Overall  Cognitive Status: Within Functional Limits for tasks assessed                                          General Comments      Exercises     Assessment/Plan    PT Assessment Patient does not need any further PT services  PT Problem List         PT Treatment Interventions      PT Goals (Current goals can be found in the Care Plan section)  Acute Rehab PT Goals Patient Stated Goal: Return home with family to assist PT Goal Formulation: With patient Time For Goal Achievement: 03/30/23 Potential to Achieve Goals: Good    Frequency       Co-evaluation               AM-PAC PT "6 Clicks" Mobility  Outcome Measure Help needed turning from your back to your side while in a flat bed without using bedrails?: None Help needed moving from lying on your back to sitting on the side of a flat bed without using bedrails?: None Help needed moving to and from a bed to a chair (including a wheelchair)?: A Little Help needed standing up from a  chair using your arms (e.g., wheelchair or bedside chair)?: None Help needed to walk in hospital room?: A Little Help needed climbing 3-5 steps with a railing? : A Little 6 Click Score: 21    End of Session   Activity Tolerance: Patient tolerated treatment well;Patient limited by fatigue Patient left: in bed;with call bell/phone within reach Nurse Communication: Mobility status PT Visit Diagnosis: Unsteadiness on feet (R26.81);Other abnormalities of gait and mobility (R26.89);Muscle weakness (generalized) (M62.81);History of falling (Z91.81)    Time: 2706-2376 PT Time Calculation (min) (ACUTE ONLY): 26 min   Charges:   PT Evaluation $PT Eval Moderate Complexity: 1 Mod PT Treatments $Therapeutic Activity: 23-37 mins PT General Charges $$ ACUTE PT VISIT: 1 Visit          [facility] [place], DPT Program

## 2023-03-27 NOTE — Discharge Summary (Addendum)
Physician Discharge Summary   Patient: Rachel Huber MRN: [id number] DOB: [birth year]  Admit date:     03/25/2023  Discharge date: 03/27/23  Discharge Physician: Onalee Hua    PCP: Lauro Regulus, MD   Recommendations at discharge:   Please follow up with primary care provider within 1-2 weeks  Please repeat BMP and CBC in one week     Hospital Course: 67 year old female with history of DM2, CKD 3b, [name] liver cirrhosis with refractory ascites status post TIPS creation (s/p revision x2), thalassemia minor, significant rectal bleeding status post superior rectal artery embolization x 2 on 02/07/22 and 11/05/22, with recurrent grade 4 internal hemorrhoids and coil embolization of the splenic artery 01/06/2023 presenting with lethargy and a mechanical fall at ground-level.  The patient went to radiology for her usual scheduled paracentesis on 03/24/2023.  Ultrasound showed there was insufficient ascites for paracentesis.  Spouse states that the patient became increasingly somnolent after she went home.  In the morning of 03/25/2023, the patient was try to get out of bed and fell onto her buttock.  There is no loss of consciousness.  When she woke up in the morning of 03/25/2023, the patient continued to have lethargy.  As a result, the patient was brought to the emergency department for further evaluation and treatment. History is supplemented by the patient's spouse.  The patient has not had any fevers, chills, chest pain, shortness breath, nausea, vomiting.  She has some loose stools.  She has had a scant amount of " pink on the toilet paper".  She denies abdominal pain or dysuria.  She denies any headache or neck pain.  The patient states that she has not been taking her lactulose as directed.  She has been taking it once a day rather than the usual 3 times daily. In the ED, the patient had low-grade temperature 99.5 F.  She was hemodynamically stable with oxygen saturation 98% on room air.  WBC 5.9,  hemoglobin 6.5, platelets 241.  Sodium 134, potassium 3.8, bicarbonate 22, serum creatinine 1.50.  AST 51, ALT 27, alk phosphatase 117, total bilirubin 2.4.  Ammonia 105.  EKG shows sinus rhythm with no ST ST changes.  CT of the brain was negative for any acute findings.  There is an unchanged meningioma.  UA  > 50 WBC. the patient was started on lactulose and ceftriaxone.  Most recently, the patient was admitted to the hospital from 01/06/2023 to 01/19/2023 secondary to a fever after her splenic artery embolization.  Her cultures were negative.  The patient was started on empiric Zosyn and transition to Augmentin and finished a week course of antibiotics.  She was transfused 1 unit PRBC during that hospitalization.  It was felt that her fever was secondary to transfusion reaction and splenic infarct.  She was subsequently transition to CIR from 01/19/2023 to 01/26/2023.  Assessment and Plan: Acute metabolic encephalopathy/hepatic encephalopathy -Multifactorial including hepatic encephalopathy, UTI, symptomatic anemia -Started lactulose 20 g 3 times daily>>6 BMs -Repeat ammonia--105>>98>>41 -UA >50WBC -CT brain negative for acute findings -Patient states that she has been poorly compliant with lactulose -8/9--mental status back to baseline -decrease lactulose to bid and titrate for 2-3BMs daily   UTI--E coli -UA>50WBC -on ceftriaxone empirically -d/c home with cefdinir x 5 more days   Decompensated NASH Liver cirrhosis -Restart torsemide spironolactone -Lactulose as discussed above -MELD 18 at time of admission   Symptomatic anemia/Hemorrhoidal bleed -Hemoglobin trended down to 6.5 on the day of admission -Transfused 2 units  PRBC on 8/7 -Baseline hemoglobin 7-8 -S/P partial splenic embolization on 5/21 by IR.  -overall rectal bleeding now very scant to none   CKD stage IIIb -Baseline creatinine 1.3-1.6 -Monitor renal function   Hypotension -Continue midodrine on paracentesis status    Type 2 DM with hyperglycemia Diet Controlled. Most recent A1c was 5.5%  -07/01/2022 hemoglobin A1c 5.5 -03/25/23 A1c--6.8   Depression with anxiety -Continue home sertraline        Consultants: none Procedures performed: none  Disposition: Home Diet recommendation:  Low sodium DISCHARGE MEDICATION: Allergies as of 03/27/2023       Reactions   Latex Rash   Contact rash   Tape Other (See Comments)   Skin sensitivity   Gramineae Pollens Other (See Comments)   Sneezing, Running nose Other Reaction(s): Other (see comments)        Medication List     TAKE these medications    acetaminophen 500 MG tablet Commonly known as: TYLENOL Take 1 tablet (500 mg total) by mouth 2 (two) times daily as needed for mild pain.   albuterol 108 (90 Base) MCG/ACT inhaler Commonly known as: VENTOLIN HFA Inhale 2 puffs into the lungs every 6 (six) hours as needed for wheezing or shortness of breath.   atorvastatin 10 MG tablet Commonly known as: LIPITOR Take 1 tablet (10 mg total) by mouth daily.   butalbital-acetaminophen-caffeine 50-325-40 MG tablet Commonly known as: FIORICET Take 1 tablet by mouth every 6 (six) hours as needed for headache.   cefdinir 300 MG capsule Commonly known as: OMNICEF Take 1 capsule (300 mg total) by mouth every 12 (twelve) hours.   ciprofloxacin 500 MG tablet Commonly known as: CIPRO Take 1 tablet (500 mg total) by mouth daily. Re-Start after finished with cefdinir What changed: additional instructions   diclofenac Sodium 1 % Gel Commonly known as: VOLTAREN Apply 4 g topically 4 (four) times daily.   gabapentin 100 MG capsule Commonly known as: Neurontin Take 1 capsule (100 mg total) by mouth 2 (two) times daily.   lactulose 10 GM/15ML solution Commonly known as: CHRONULAC Take 30 mLs (20 g total) by mouth 3 (three) times daily. What changed:  how much to take when to take this   multivitamin with minerals tablet Take 1 tablet by mouth  daily.   sertraline 50 MG tablet Commonly known as: ZOLOFT Take 1 tablet (50 mg total) by mouth daily.   spironolactone 50 MG tablet Commonly known as: ALDACTONE Take 1 tablet (50 mg total) by mouth daily.   torsemide 20 MG tablet Commonly known as: DEMADEX Take 40 mg by mouth daily.   Vitamin D (Ergocalciferol) 1.25 MG (50000 UNIT) Caps capsule Commonly known as: DRISDOL Take 1 capsule by mouth once a week What changed:  when to take this additional instructions        Discharge Exam: Filed Weights   03/25/23 1126 03/25/23 1622  Weight: 108.9 kg 110.9 kg   HEENT:  [place]/AT, No thrush, no icterus CV:  RRR, no rub, no S3, no S4 Lung:  CTA, no wheeze, no rhonchi Abd:  soft/+BS, NT Ext:  No edema, no lymphangitis, no synovitis, no rash   Condition at discharge: stable  The results of significant diagnostics from this hospitalization (including imaging, microbiology, ancillary and laboratory) are listed below for reference.   Imaging Studies: CT Head Wo Contrast  Result Date: 03/25/2023 CLINICAL DATA:  Head trauma, minor (Age >= 65y). EXAM: CT HEAD WITHOUT CONTRAST TECHNIQUE: Contiguous axial images were obtained  from the base of the skull through the vertex without intravenous contrast. RADIATION DOSE REDUCTION: This exam was performed according to the departmental dose-optimization program which includes automated exposure control, adjustment of the mA and/or kV according to patient size and/or use of iterative reconstruction technique. COMPARISON:  Head CT 09/18/2022. FINDINGS: Brain: No acute hemorrhage. Gray-white differentiation is preserved. No hydrocephalus or extra-axial collection. 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 Mild mass effect in the right anterior temporal lobe. Vascular: No hyperdense vessel or unexpected calcification. Skull: No calvarial fracture or suspicious bone lesion. Skull base is unremarkable.  Sinuses/Orbits: No acute finding. Other: None. IMPRESSION: 1. No acute intracranial abnormality. 2. Unchanged 24 mm calcified right temporal meningioma. Electronically Signed   By: Orvan Falconer M.D.   On: 03/25/2023 13:30   [place] ASCITES (ABDOMEN LIMITED)  Result Date: 03/24/2023 CLINICAL DATA:  History of cirrhosis and ascites requiring paracentesis procedures. Recent weekly interval for paracentesis. EXAM: LIMITED ABDOMEN ULTRASOUND FOR ASCITES TECHNIQUE: Limited ultrasound survey for ascites was performed in all four abdominal quadrants. COMPARISON:  Most recent paracentesis procedure on 03/17/2023 FINDINGS: Four quadrant survey of the peritoneal cavity does not show a large amount of ascites and paracentesis was deferred today. Last procedure yielded 2.65 L and the procedure before that yielded 4.6 L which followed a 2 week interval. Decision was made to defer paracentesis for another week. Paracentesis interval likely can be performed every 2 weeks currently based on more recent frequency and fluid volumes. IMPRESSION: Relatively small volume of ascites today. Paracentesis deferred today. Paracentesis interval likely can be performed every 2 weeks currently. Procedure will be deferred for another week. Electronically Signed   By: Irish Lack M.D.   On: 03/24/2023 12:07   CT ANGIO ABD/PELVIS BRTO  Addendum Date: 03/18/2023   ADDENDUM REPORT: 03/18/2023 16:13 ADDENDUM: These results were sent by spark to Dr. Leighton Parody by Ronne Binning on7/31/2024 at 4 pm, who acknowledged these results. Electronically Signed   By: Darliss Cheney M.D.   On: 03/18/2023 16:13   Result Date: 03/18/2023 CLINICAL DATA:  Liver cirrhosis secondary to NASH. Splenic sequestration. History of splenic artery branch embolization. EXAM: CTA ABDOMEN AND PELVIS WITHOUT AND WITH CONTRAST TECHNIQUE: Multidetector CT imaging of the abdomen and pelvis was performed using the standard protocol during bolus administration of intravenous  contrast. Multiplanar reconstructed images and MIPs were obtained and reviewed to evaluate the vascular anatomy. RADIATION DOSE REDUCTION: This exam was performed according to the departmental dose-optimization program which includes automated exposure control, adjustment of the mA and/or kV according to patient size and/or use of iterative reconstruction technique. CONTRAST:  75mL OMNIPAQUE IOHEXOL 350 MG/ML SOLN COMPARISON:  CT abdomen and pelvis without contrast 01/16/2023. FINDINGS: VASCULAR Aorta: Normal caliber aorta without aneurysm, dissection, vasculitis or significant stenosis. There is moderate calcified atherosclerotic disease throughout the aorta. Celiac: Celiac artery and branches appear patent. Embolic coils are seen at the level of the splenic hilum, unchanged. No aneurysm or dissection. SMA: Patent without evidence of aneurysm, dissection, vasculitis or significant stenosis. Renals: Both renal arteries are patent without evidence of aneurysm, dissection, vasculitis, fibromuscular dysplasia or significant stenosis. IMA: Patent without evidence of aneurysm, dissection, vasculitis or significant stenosis. Inflow: Patent without evidence of aneurysm, dissection, vasculitis or significant stenosis. Proximal Outflow: Bilateral common femoral and visualized portions of the superficial and profunda femoral arteries are patent without evidence of aneurysm, dissection, vasculitis or significant stenosis. There is a surgical clip adjacent to the  right common femoral artery. Veins: Main portal vein and splenic vein appear enlarged, but grossly patent. Superior mesenteric vein grossly patent. IVC not well opacified. Can not confirm patency of tip shunt secondary to timing of the contrast bolus. Multiple varices are seen in the anterior abdomen. Review of the MIP images confirms the above findings. NON-VASCULAR Lower chest: There are findings suspicious for right lower lobe segmental pulmonary embolism on image  5/7. Lung bases are otherwise clear. Hepatobiliary: Gallstones and gallbladder sludge are present. There is nodular liver contour compatible with cirrhosis, unchanged. No focal liver lesions are identified. Pancreas: Unremarkable. No pancreatic ductal dilatation or surrounding inflammatory changes. 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 The spleen is mildly enlarged, unchanged. Adrenals/Urinary Tract: Adrenal glands are unremarkable. Kidneys are normal, without renal calculi, focal lesion, or hydronephrosis. Bladder is unremarkable. Stomach/Bowel: Stomach is within normal limits. Appendix appears normal. No evidence of bowel wall thickening, distention, or inflammatory changes. The appendix is not visualized. Lymphatic: There are enlarged bilateral inguinal lymph nodes measuring up to 1.5 cm short axis. There are no enlarged intra-abdominal or pelvic lymph nodes identified. Reproductive: Status post hysterectomy. No adnexal masses. Other: There is small volume ascites. There is diffuse body wall edema. There is a small umbilical hernia containing fluid. There surgical clips or coils in the posterior pelvis, unchanged. Musculoskeletal: Multilevel degenerative changes affect the spine. IMPRESSION: 1. Findings suspicious for right lower lobe segmental pulmonary embolism. 2. Stable findings of cirrhosis and portal hypertension. 3. IVC not well opacified. Can not confirm patency of the tip shunt secondary to timing of the contrast bolus. 4. Stable findings of splenic artery embolization. Aortic Atherosclerosis (ICD10-I70.0). NON-VASCULAR 1. Stable splenic infarcts. 2. Stable small volume ascites. 3. Diffuse body wall edema. 4. Cholelithiasis. 5. Bilateral inguinal lymphadenopathy. Electronically Signed: By: Darliss Cheney M.D. On: 03/18/2023 15:50   US Paracentesis  Result Date: 03/17/2023 INDICATION: Patient with a history of cirrhosis with recurrent ascites. Interventional  radiology asked to perform a therapeutic paracentesis. Patient is status post Tips and Tips revision x 2 at [facility]. She is also status post partial splenic embolization. EXAM: ULTRASOUND GUIDED PARACENTESIS MEDICATIONS: 1% lidocaine 30 mL COMPLICATIONS: None immediate. PROCEDURE: Informed written consent was obtained from the patient after a discussion of the risks, benefits and alternatives to treatment. A timeout was performed prior to the initiation of the procedure. Initial ultrasound scanning demonstrates a large amount of ascites within the right lower abdominal quadrant. The right lower abdomen was prepped and draped in the usual sterile fashion. 1% lidocaine was used for local anesthesia. Following this, a 19 gauge, 10-cm, Yueh catheter was introduced. An ultrasound image was saved for documentation purposes. The paracentesis was performed. The catheter was removed and a dressing was applied. The patient tolerated the procedure well without immediate post procedural complication. FINDINGS: A total of approximately 2.65 L of clear yellow fluid was removed. IMPRESSION: Successful ultrasound-guided paracentesis yielding 2.65 liters of peritoneal fluid. Procedure performed by Alwyn Ren NP and supervised by Dr. Loreta Ave. Electronically Signed   By: Gilmer Mor D.O.   On: 03/17/2023 13:14   US Paracentesis  Result Date: 03/10/2023 INDICATION: Cirrhosis and ascites. EXAM: ULTRASOUND GUIDED PARACENTESIS MEDICATIONS: None. COMPLICATIONS: None immediate. PROCEDURE: Informed written consent was obtained from the patient after a discussion of the risks, benefits and alternatives to treatment. A timeout was performed prior to the initiation of the procedure. Initial ultrasound was performed to localize ascites. The right lower abdomen was prepped and draped  in the usual sterile fashion. 1% lidocaine was used for local anesthesia. Following this, a 6 French Safe-T-Centesis catheter was introduced. An ultrasound  image was saved for documentation purposes. The paracentesis was performed. The catheter was removed and a dressing was applied. The patient tolerated the procedure well without immediate post procedural complication. Patient received post-procedure intravenous albumin; see nursing notes for details. FINDINGS: A total of approximately 4.6 L of dark yellow/amber colored fluid was removed. IMPRESSION: Successful ultrasound-guided paracentesis yielding 4.6 liters of peritoneal fluid. Electronically Signed   By: Irish Lack M.D.   On: 03/10/2023 12:49    Microbiology: Results for orders placed or performed during the hospital encounter of 03/25/23  Urine Culture (for pregnant, neutropenic or urologic patients or patients with an indwelling urinary catheter)     Status: Abnormal   Collection Time: 03/25/23  3:26 PM   Specimen: Urine, Clean Catch  Result Value Ref Range Status   Specimen Description   Final    URINE, CLEAN CATCH Performed at [facility], [address] [place], [place] [address]    Special Requests   Final    NONE Performed at [facility], [address] [place], [place] [address]    Culture >=100,000 COLONIES/mL ESCHERICHIA COLI (A)  Final   Report Status 03/27/2023 FINAL  Final   Organism ID, Bacteria ESCHERICHIA COLI (A)  Final      Susceptibility   Escherichia coli - MIC*    AMPICILLIN >=32 RESISTANT Resistant     CEFAZOLIN <=4 SENSITIVE Sensitive     CEFEPIME 0.5 SENSITIVE Sensitive     CEFTRIAXONE <=0.25 SENSITIVE Sensitive     CIPROFLOXACIN >=4 RESISTANT Resistant     GENTAMICIN <=1 SENSITIVE Sensitive     IMIPENEM <=0.25 SENSITIVE Sensitive     NITROFURANTOIN <=16 SENSITIVE Sensitive     TRIMETH/SULFA <=20 SENSITIVE Sensitive     AMPICILLIN/SULBACTAM >=32 RESISTANT Resistant     PIP/TAZO 8 SENSITIVE Sensitive     * >=100,000 COLONIES/mL ESCHERICHIA COLI    Labs: CBC: Recent Labs  Lab 03/23/23 1414 03/25/23 1248 03/26/23 0405 03/27/23 0409  WBC 3.9*  5.9 4.4 4.2  HGB 6.3* 6.5* 7.5* 7.3*  HCT 21.0* 20.4* 23.6* 23.2*  MCV 71.9* 70.8* 73.3* 74.4*  PLT 231 241 193 181   Basic Metabolic Panel: Recent Labs  Lab 03/25/23 1248 03/26/23 0405 03/27/23 0409  NA 134* 136 134*  K 3.8 3.7 3.5  CL 105 105 105  CO2 22 23 23   GLUCOSE 216* 146* 205*  BUN 38* 36* 37*  CREATININE 1.50* 1.36* 1.45*  CALCIUM 8.3* 8.2* 7.9*  MG  --  1.8  --    Liver Function Tests: Recent Labs  Lab 03/25/23 1248 03/26/23 0405 03/27/23 0409  AST 51* 44* 39  ALT 27 26 21   ALKPHOS 117 106 108  BILITOT 2.4* 2.2* 1.3*  PROT 5.3* 5.2* 5.2*  ALBUMIN 2.5* 2.4* 2.3*   CBG: No results for input(s): "GLUCAP" in the last 168 hours.  Discharge time spent: greater than 30 minutes.  Signed: Catarina Hartshorn, MD [facility] 03/27/2023

## 2023-03-30 ENCOUNTER — Telehealth: Payer: Self-pay

## 2023-03-30 ENCOUNTER — Inpatient Hospital Stay: Payer: Medicare Other

## 2023-03-30 ENCOUNTER — Other Ambulatory Visit: Payer: Self-pay | Admitting: Physician Assistant

## 2023-03-30 ENCOUNTER — Ambulatory Visit
Admission: RE | Admit: 2023-03-30 | Discharge: 2023-03-30 | Disposition: A | Discharge status: Home / Self Care | Payer: Medicare Other | Source: Ambulatory Visit | Attending: Student | Admitting: Student

## 2023-03-30 ENCOUNTER — Other Ambulatory Visit: Payer: Self-pay

## 2023-03-30 DIAGNOSIS — D61818 Other pancytopenia: Secondary | ICD-10-CM

## 2023-03-30 DIAGNOSIS — D500 Iron deficiency anemia secondary to blood loss (chronic): Secondary | ICD-10-CM | POA: Diagnosis present

## 2023-03-30 DIAGNOSIS — D7389 Other diseases of spleen: Secondary | ICD-10-CM

## 2023-03-30 DIAGNOSIS — N184 Chronic kidney disease, stage 4 (severe): Secondary | ICD-10-CM | POA: Diagnosis not present

## 2023-03-30 DIAGNOSIS — D631 Anemia in chronic kidney disease: Secondary | ICD-10-CM | POA: Diagnosis not present

## 2023-03-30 DIAGNOSIS — D561 Beta thalassemia: Secondary | ICD-10-CM | POA: Diagnosis not present

## 2023-03-30 HISTORY — PX: IR RADIOLOGIST EVAL & MGMT: IMG5224

## 2023-03-30 LAB — AMMONIA: Ammonia: 81 umol/L — ABNORMAL HIGH (ref 9–35)

## 2023-03-30 LAB — CBC
HCT: 23.4 % — ABNORMAL LOW (ref 36.0–46.0)
Hemoglobin: 7.3 g/dL — ABNORMAL LOW (ref 12.0–15.0)
MCH: 23.5 pg — ABNORMAL LOW (ref 26.0–34.0)
MCHC: 31.2 g/dL (ref 30.0–36.0)
MCV: 75.2 fL — ABNORMAL LOW (ref 80.0–100.0)
Platelets: 182 10*3/uL (ref 150–400)
RBC: 3.11 MIL/uL — ABNORMAL LOW (ref 3.87–5.11)
RDW: 24.1 % — ABNORMAL HIGH (ref 11.5–15.5)
WBC: 4 10*3/uL (ref 4.0–10.5)
nRBC: 0 % (ref 0.0–0.2)

## 2023-03-30 LAB — SAMPLE TO BLOOD BANK

## 2023-03-30 MED ORDER — SODIUM CHLORIDE 0.9% FLUSH
10.0000 mL | Freq: Once | INTRAVENOUS | Status: AC
  Administered 2023-03-30: 10 mL via INTRAVENOUS

## 2023-03-30 MED ORDER — HEPARIN SOD (PORK) LOCK FLUSH 100 UNIT/ML IV SOLN
500.0000 [IU] | Freq: Once | INTRAVENOUS | Status: AC
  Administered 2023-03-30: 500 [IU] via INTRAVENOUS

## 2023-03-30 NOTE — Telephone Encounter (Signed)
Per last office note, okay to refill Vitamin D.

## 2023-03-30 NOTE — Telephone Encounter (Signed)
Called patient's husband to let them know her hemoglobin, 7.3, and her albumin is 8.1. no need for transfusion per standing orders.

## 2023-03-30 NOTE — Progress Notes (Signed)
Family requesting for ammonia level to be drawn with each lab appointment due to recent hospitalization.  Reviewed with provider and ok to draw ammonia levels with labs verbal order Rojelio Brenner, PA.    Patients port flushed without difficulty.  Good blood return noted with no bruising or swelling noted at site.  Band aid applied.  VSS with discharge and left in satisfactory condition with no s/s of distress noted.

## 2023-03-30 NOTE — Progress Notes (Signed)
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  She underwent "emborrhoid" procedure on 02/07/22 which significantly improved her rectal bleeding initially which gradually returned.  She underwent repeat angiogram and embolization of the duplicated bilateral superior rectal arteries on 09/05/22 due to persistent anemia requiring transfusions.  She was recently diagnosed with thalassemia.  Unfortunately, she presented to [facility] again in March for rectal bleeding and associated profound anemia and underwent additional superior rectal embolization by Dr. Archer Asa on 11/05/22.  She had a port placed by Dr. Miles Costain on 11/25/22.  She has continued to require near-weekly blood transfusions, most recent on 12/11/22, and weekly paracenteses at [facility].     She states that her rectal bleeding has improved from procedure, though still occurring, and noticeably worse when her ascites is greatest.  She has no known further procedural plans with [facility] for TIPS revision.  Has several steps to complete in Transplant workup.    She underwent partial splenic embolization at [facility] on 01/06/23 which went as planned without complication.  She was admitted after the procedure for blood transfusion and developed some encephalopathy.  Since then, she has overall been doing very well.  Her frequency of paracentesis and volume of ascites has diminished significantly.  She has had several appointments where there wasn't enough fluid noted to drain.  Additionally, her platelets have returned to normal  range.  They increased dramatically after the procedure.  She reports only scant rectal bleeding.  She has had a few blood transfusions since discharge for her chronic anemia, however less frequent than prior.  She remains on lactulose, and had one lapse several weeks ago which resulted in admission, but has been compliant and without encephalopathy since.  Overall, she is pleased about the results.   Past Medical History:  Diagnosis Date   Anemia in chronic kidney disease (CKD) 02/07/2022   Anxiety    Arthritis    Ascites of liver    Weekly paracentesis   Cirrhosis of liver (HCC)    CKD (chronic kidney disease) stage 4, GFR 15-29 ml/min (HCC)    GERD (gastroesophageal reflux disease)    Grade IV internal hemorrhoids    Hypertension    NASH (nonalcoholic steatohepatitis)    Neuropathy, diabetic (HCC)    Obesity (BMI 30-39.9)    OSA on CPAP    Pneumonia    Portal hypertension (HCC)    S/P TIPS (transjugular intrahepatic portosystemic shunt)    Thalassemia minor 1992    Past Surgical History:  Procedure Laterality Date   ABDOMINAL HYSTERECTOMY     BREAST BIOPSY Right 02/15/2018   [place] bx 6-6:30 ribbon shape, ONE CORE FRAGMENT WITH FIBROSIS. ONE CORE FRAGMENT WITH PORTION OF A DILATED   BREAST BIOPSY Right 02/15/2018   [place] bx 9:00 heart shape, USUAL DUCTAL HYPERPLASIA   BREAST LUMPECTOMY Right 03/09/2018   Procedure: BREAST LUMPECTOMY x 2;  Surgeon: Sung Amabile, DO;  Location: [facility] ORS;  Service: General;  Laterality: Right;   CATARACT EXTRACTION W/PHACO Left 06/11/2016   Procedure: CATARACT EXTRACTION PHACO AND INTRAOCULAR LENS PLACEMENT (IOC);  Surgeon: Sallee Lange, MD;  Location: [facility] ORS;  Service: Ophthalmology;  Laterality: Left;  Lot # [id number] H US:01:38.6 AP%:26.4 CDE:44.15   CATARACT EXTRACTION W/PHACO Right 06/15/2018   Procedure: CATARACT EXTRACTION PHACO AND INTRAOCULAR LENS PLACEMENT (IOC);  Surgeon: Galen Manila, MD;  Location: [facility] ORS;  Service:  Ophthalmology;  Laterality: Right;  [place] 00:38.2 CDE 4.23 Fluid Pack Lot # [id number]   COLONOSCOPIES     COLONOSCOPY WITH PROPOFOL N/A 10/10/2020   Procedure: COLONOSCOPY WITH PROPOFOL;  Surgeon: Regis Bill, MD;  Location: [facility] ENDOSCOPY;  Service: Endoscopy;  Laterality: N/A;   COLONOSCOPY WITH PROPOFOL N/A 11/20/2020   Procedure: COLONOSCOPY WITH PROPOFOL;  Surgeon: Regis Bill, MD;  Location: [facility] ENDOSCOPY;  Service: Endoscopy;  Laterality: N/A;  DM STAT CBC, BMP COVID POSITIVE 09/02/2020   COLONOSCOPY WITH PROPOFOL N/A 06/19/2022   Procedure: COLONOSCOPY WITH PROPOFOL;  Surgeon: Regis Bill, MD;  Location: [facility] ENDOSCOPY;  Service: Endoscopy;  Laterality: N/A;   CTR     ESOPHAGOGASTRODUODENOSCOPY (EGD) WITH PROPOFOL N/A 10/10/2020   Procedure: ESOPHAGOGASTRODUODENOSCOPY (EGD) WITH PROPOFOL;  Surgeon: Regis Bill, MD;  Location: [facility] ENDOSCOPY;  Service: Endoscopy;  Laterality: N/A;  COVID POSITIVE 10/08/2020   FLEXIBLE SIGMOIDOSCOPY N/A 02/06/2022   Procedure: FLEXIBLE SIGMOIDOSCOPY;  Surgeon: Regis Bill, MD;  Location: [facility] ENDOSCOPY;  Service: Endoscopy;  Laterality: N/A;  Patient requests anesthesia   FLEXIBLE SIGMOIDOSCOPY N/A 12/19/2022   Procedure: FLEXIBLE SIGMOIDOSCOPY;  Surgeon: Dolores Frame, MD;  Location: [facility];  Service: Gastroenterology;  Laterality: N/A;   IR ANGIOGRAM SELECTIVE EACH ADDITIONAL VESSEL  02/07/2022   IR ANGIOGRAM SELECTIVE EACH ADDITIONAL VESSEL  02/07/2022   IR ANGIOGRAM SELECTIVE EACH ADDITIONAL VESSEL  09/05/2022   IR ANGIOGRAM VISCERAL SELECTIVE  02/07/2022   IR ANGIOGRAM VISCERAL SELECTIVE  09/05/2022   IR CV LINE INJECTION  02/24/2023   IR EMBO ART  VEN HEMORR LYMPH EXTRAV  INC GUIDE ROADMAPPING  09/05/2022   IR EMBO ART  VEN HEMORR LYMPH EXTRAV  INC GUIDE ROADMAPPING  11/05/2022   IR EMBO ARTERIAL NOT HEMORR HEMANG INC GUIDE ROADMAPPING  02/07/2022   IR EMBO TUMOR ORGAN ISCHEMIA INFARCT INC GUIDE ROADMAPPING   01/06/2023   IR IMAGING GUIDED PORT INSERTION  06/13/2022   IR IMAGING GUIDED PORT INSERTION  11/25/2022   IR PARACENTESIS  12/17/2021   IR PARACENTESIS  03/25/2022   IR PARACENTESIS  11/25/2022   IR PARACENTESIS  01/06/2023   IR PARACENTESIS  01/19/2023   IR RADIOLOGIST EVAL & MGMT  03/18/2022   IR RADIOLOGIST EVAL & MGMT  05/07/2022   IR RADIOLOGIST EVAL & MGMT  05/16/2022   IR RADIOLOGIST EVAL & MGMT  10/13/2022   IR RADIOLOGIST EVAL & MGMT  12/12/2022   IR US GUIDE VASC ACCESS RIGHT  02/07/2022   IR US GUIDE VASC ACCESS RIGHT  09/05/2022   JOINT REPLACEMENT     KNEE SURGERY Right 09/24/2017   plates and pins   PORT-A-CATH REMOVAL N/A 09/23/2022   Procedure: MINOR REMOVAL PORT-A-CATH;  Surgeon: Franky Macho, MD;  Location: [facility] OR[facility];  Service: General;  Laterality: N/A;   TEE WITHOUT CARDIOVERSION N/A 09/23/2022   Procedure: TRANSESOPHAGEAL ECHOCARDIOGRAM (TEE);  Surgeon: Antoine Poche, MD;  Location: [facility];  Service: Endoscopy;  Laterality: N/A;   TOTAL SHOULDER ARTHROPLASTY Right 09/27/2015    Allergies: Latex, Tape, and Gramineae pollens  Medications: Prior to Admission medications   Medication Sig Start Date End Date Taking? Authorizing Provider  acetaminophen (TYLENOL) 500 MG tablet Take 1 tablet (  500 mg total) by mouth 2 (two) times daily as needed for mild pain. 01/26/23   Angiulli, Mcarthur Rossetti, PA-C  albuterol (VENTOLIN HFA) 108 (90 Base) MCG/ACT inhaler Inhale 2 puffs into the lungs every 6 (six) hours as needed for wheezing or shortness of breath. 01/26/23   Angiulli, Mcarthur Rossetti, PA-C  atorvastatin (LIPITOR) 10 MG tablet Take 1 tablet (10 mg total) by mouth daily. 01/26/23   Angiulli, Mcarthur Rossetti, PA-C  butalbital-acetaminophen-caffeine (FIORICET) [phone] MG tablet Take 1 tablet by mouth every 6 (six) hours as needed for headache. 01/26/23   Angiulli, Mcarthur Rossetti, PA-C  cefdinir (OMNICEF) 300 MG capsule Take 1 capsule (300 mg total) by mouth every 12 (twelve) hours. 03/27/23   Catarina Hartshorn, MD   ciprofloxacin (CIPRO) 500 MG tablet Take 1 tablet (500 mg total) by mouth daily. Re-Start after finished with cefdinir 03/27/23 03/26/24  TatOnalee Hua, MD  diclofenac Sodium (VOLTAREN) 1 % GEL Apply 4 g topically 4 (four) times daily. 01/26/23   Angiulli, Mcarthur Rossetti, PA-C  gabapentin (NEURONTIN) 100 MG capsule Take 1 capsule (100 mg total) by mouth 2 (two) times daily. 02/17/23   Raulkar, Drema Pry, MD  lactulose (CHRONULAC) 10 GM/15ML solution Take 30 mLs (20 g total) by mouth 3 (three) times daily. Patient taking differently: Take 10 mLs by mouth daily. 01/26/23   Angiulli, Mcarthur Rossetti, PA-C  Multiple Vitamins-Minerals (MULTIVITAMIN WITH MINERALS) tablet Take 1 tablet by mouth daily.    [provider]  sertraline (ZOLOFT) 50 MG tablet Take 1 tablet (50 mg total) by mouth daily. 01/26/23   Angiulli, Mcarthur Rossetti, PA-C  spironolactone (ALDACTONE) 50 MG tablet Take 1 tablet (50 mg total) by mouth daily. 01/26/23   Angiulli, Mcarthur Rossetti, PA-C  torsemide (DEMADEX) 20 MG tablet Take 40 mg by mouth daily. 01/26/23   [provider]  Vitamin D, Ergocalciferol, (DRISDOL) 1.25 MG (50000 UNIT) CAPS capsule Take 1 capsule by mouth once a week 03/30/23   Carnella Guadalajara, PA-C     Family History  Problem Relation Age of Onset   Breast cancer Mother 70   Lymphoma Mother    Diabetes Father    Kidney cancer Father    Heart disease Father    Diabetes Sister    Breast cancer Sister 65    Social History   Socioeconomic History   Marital status: Married    Spouse name: Not on file   Number of children: 0   Years of education: Not on file   Highest education level: Not on file  Occupational History   Occupation: EMPLOYED  Tobacco Use   Smoking status: Never   Smokeless tobacco: Never  Vaping Use   Vaping status: Never Used  Substance and Sexual Activity   Alcohol use: No   Drug use: Never   Sexual activity: Not Currently  Other Topics Concern   Not on file  Social History Narrative   Not on  file   Social Determinants of Health   Financial Resource Strain: Low Risk  (04/04/2022)   Overall Financial Resource Strain (CARDIA)    Difficulty of Paying Living Expenses: Not hard at all  Food Insecurity: Patient Declined (03/25/2023)   Hunger Vital Sign    Worried About Running Out of Food in the Last Year: Patient declined    Ran Out of Food in the Last Year: Patient declined  Transportation Needs: Patient Declined (03/25/2023)   PRAPARE - Administrator, Civil Service (Medical): Patient declined  Lack of Transportation (Non-Medical): Patient declined  Physical Activity: Inactive (04/04/2022)   Exercise Vital Sign    Days of Exercise per Week: 0 days    Minutes of Exercise per Session: 0 min  Stress: No Stress Concern Present (04/04/2022)   [employer] of Occupational Health - Occupational Stress Questionnaire    Feeling of Stress : Only a little  Social Connections: Moderately Isolated (04/04/2022)   Social Connection and Isolation Panel [NHANES]    Frequency of Communication with Friends and Family: More than three times a week    Frequency of Social Gatherings with Friends and Family: Three times a week    Attends Religious Services: Never    Active Member of Clubs or Organizations: No    Attends Banker Meetings: Never    Marital Status: Married     Vital Signs: There were no vitals taken for this visit.  No physical examination was performed in lieu of virtual telephone clinic visit.  Imaging: Angio 09/05/22  Pre    Post   Angio 11/05/22  Pre    Post embo   CTA 03/18/23  Spleen = 15 x 12 x 15 cm (volume 1.35 L, previously 18 x 12 x 18 cm, 1.944 L) - volume reduction of 31%   Labs:   CBC: Recent Labs    03/23/23 1414 03/25/23 1248 03/26/23 0405 03/27/23 0409  WBC 3.9* 5.9 4.4 4.2  HGB 6.3* 6.5* 7.5* 7.3*  HCT 21.0* 20.4* 23.6* 23.2*  PLT 231 241 193 181    COAGS: Recent Labs    09/19/22 0359 11/03/22 0827  11/17/22 0114 01/06/23 1016 01/06/23 1427 01/15/23 0007 03/25/23 1248 03/27/23 0409  INR  --    < > 1.1 1.4*  --  1.4* 1.2 1.2  APTT 36  --  33  --  34 34  --   --    < > = values in this interval not displayed.    BMP: Recent Labs    02/16/23 1121 03/25/23 1248 03/26/23 0405 03/27/23 0409  NA 134* 134* 136 134*  K 3.8 3.8 3.7 3.5  CL 104 105 105 105  CO2 22 22 23 23   GLUCOSE 142* 216* 146* 205*  BUN 26* 38* 36* 37*  CALCIUM 8.1* 8.3* 8.2* 7.9*  CREATININE 1.34* 1.50* 1.36* 1.45*  GFRNONAA 44* 38* 43* 40*    LIVER FUNCTION TESTS: Recent Labs    02/16/23 1121 03/25/23 1248 03/26/23 0405 03/27/23 0409  BILITOT 1.8* 2.4* 2.2* 1.3*  AST 40 51* 44* 39  ALT 20 27 26 21   ALKPHOS 137* 117 106 108  PROT 5.1* 5.3* 5.2* 5.2*  ALBUMIN 2.2* 2.5* 2.4* 2.3*    Assessment and Plan: Rachel Huber is a 67 year old female with 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  She underwent "emborrhoid" procedure on 02/07/22 which significantly improved her rectal bleeding initially which gradually returned.  She underwent repeat angiogram and embolization of the duplicated bilateral superior rectal arteries on 09/05/22 due to persistent anemia requiring transfusions.  Again, she underwent repeat superior rectal embolization on 11/05/22 by my partner, Dr. Archer Asa.  Due to being in a dire clinical position requiring weekly blood transfusions and paracenteses, we pursued partial splenic embolization on 01/06/23.  This has reversed her thrombocytopenia, resulted in 30% volume reduction of the spleen, and significantly slowed her accumulation of ascites,  in addition to significantly decreasing her rectal bleeding.  She is overall very pleased with the results.  Plan to follow up in 6 months with repeat CTA abdomen/pelvis BRTO protocol  followed by IR clinic visit shortly thereafter.  Electronically Signed: Bennie Dallas 03/30/2023, 11:44 AM Marliss Coots, MD Pager: [phone] Clinic: [phone]    I spent a total of 40 Minutes in virtual telephone clinical consultation, greater than 50% of which was counseling/coordinating care for portal hypertension.

## 2023-03-30 NOTE — Patient Instructions (Signed)

## 2023-03-31 ENCOUNTER — Ambulatory Visit
Admission: RE | Admit: 2023-03-31 | Discharge: 2023-03-31 | Disposition: A | Discharge status: Home / Self Care | Payer: Medicare Other | Source: Ambulatory Visit | Attending: Gastroenterology | Admitting: Gastroenterology

## 2023-03-31 ENCOUNTER — Other Ambulatory Visit: Payer: Self-pay | Admitting: Gastroenterology

## 2023-03-31 DIAGNOSIS — K7581 Nonalcoholic steatohepatitis (NASH): Secondary | ICD-10-CM | POA: Diagnosis not present

## 2023-03-31 DIAGNOSIS — K7460 Unspecified cirrhosis of liver: Secondary | ICD-10-CM

## 2023-04-01 ENCOUNTER — Encounter (HOSPITAL_COMMUNITY): Payer: 59 | Admitting: Occupational Therapy

## 2023-04-02 ENCOUNTER — Encounter (HOSPITAL_COMMUNITY): Payer: Self-pay | Admitting: Physical Therapy

## 2023-04-02 ENCOUNTER — Ambulatory Visit (HOSPITAL_COMMUNITY): Payer: Medicare Other | Admitting: Occupational Therapy

## 2023-04-02 ENCOUNTER — Ambulatory Visit (HOSPITAL_COMMUNITY): Payer: Medicare Other | Admitting: Physical Therapy

## 2023-04-02 ENCOUNTER — Encounter (HOSPITAL_COMMUNITY): Payer: Self-pay | Admitting: Occupational Therapy

## 2023-04-02 DIAGNOSIS — K7682 Hepatic encephalopathy: Secondary | ICD-10-CM | POA: Diagnosis present

## 2023-04-02 DIAGNOSIS — M6281 Muscle weakness (generalized): Secondary | ICD-10-CM | POA: Diagnosis present

## 2023-04-02 DIAGNOSIS — R29818 Other symptoms and signs involving the nervous system: Secondary | ICD-10-CM

## 2023-04-02 DIAGNOSIS — R2689 Other abnormalities of gait and mobility: Secondary | ICD-10-CM | POA: Diagnosis present

## 2023-04-02 DIAGNOSIS — R278 Other lack of coordination: Secondary | ICD-10-CM | POA: Diagnosis present

## 2023-04-02 NOTE — Therapy (Signed)
OUTPATIENT PHYSICAL THERAPY LOWER TREATMENT   Patient Name: BRENEE FOODY MRN: [id number] DOB:[birth year], 67 y.o., female Today's Date: 04/02/2023  Progress Note   Reporting Period 03/06/23 to 04/02/23   See note below for Objective Data and Assessment of Progress/Goals    END OF SESSION:  PT End of Session - 04/02/23 1352     Visit Number 2    Number of Visits 16   1-2x/week for 8 weeks   Date for PT Re-Evaluation 05/01/23    Authorization Type Medicare A/ Secondary: [employer]    Progress Note Due on Visit 10    PT Start Time 1349    PT Stop Time 1428    PT Time Calculation (min) 39 min    Activity Tolerance Patient tolerated treatment well    Behavior During Therapy [facility] for tasks assessed/performed             Past Medical History:  Diagnosis Date   Anemia in chronic kidney disease (CKD) 02/07/2022   Anxiety    Arthritis    Ascites of liver    Weekly paracentesis   Cirrhosis of liver (HCC)    CKD (chronic kidney disease) stage 4, GFR 15-29 ml/min (HCC)    GERD (gastroesophageal reflux disease)    Grade IV internal hemorrhoids    Hypertension    NASH (nonalcoholic steatohepatitis)    Neuropathy, diabetic (HCC)    Obesity (BMI 30-39.9)    OSA on CPAP    Pneumonia    Portal hypertension (HCC)    S/P TIPS (transjugular intrahepatic portosystemic shunt)    Thalassemia minor 1992   Past Surgical History:  Procedure Laterality Date   ABDOMINAL HYSTERECTOMY     BREAST BIOPSY Right 02/15/2018   [place] bx 6-6:30 ribbon shape, ONE CORE FRAGMENT WITH FIBROSIS. ONE CORE FRAGMENT WITH PORTION OF A DILATED   BREAST BIOPSY Right 02/15/2018   [place] bx 9:00 heart shape, USUAL DUCTAL HYPERPLASIA   BREAST LUMPECTOMY Right 03/09/2018   Procedure: BREAST LUMPECTOMY x 2;  Surgeon: Sung Amabile, DO;  Location: [facility];  Service: General;  Laterality: Right;   CATARACT EXTRACTION W/PHACO Left 06/11/2016   Procedure: CATARACT EXTRACTION PHACO AND INTRAOCULAR LENS PLACEMENT (IOC);   Surgeon: Sallee Lange, MD;  Location: [facility];  Service: Ophthalmology;  Laterality: Left;  Lot # [id number] H US:01:38.6 AP%:26.4 CDE:44.15   CATARACT EXTRACTION W/PHACO Right 06/15/2018   Procedure: CATARACT EXTRACTION PHACO AND INTRAOCULAR LENS PLACEMENT (IOC);  Surgeon: Galen Manila, MD;  Location: [facility];  Service: Ophthalmology;  Laterality: Right;  [place] 00:38.2 CDE 4.23 Fluid Pack Lot # [id number]   COLONOSCOPIES     COLONOSCOPY WITH PROPOFOL N/A 10/10/2020   Procedure: COLONOSCOPY WITH PROPOFOL;  Surgeon: Regis Bill, MD;  Location: [facility];  Service: Endoscopy;  Laterality: N/A;   COLONOSCOPY WITH PROPOFOL N/A 11/20/2020   Procedure: COLONOSCOPY WITH PROPOFOL;  Surgeon: Regis Bill, MD;  Location: [facility];  Service: Endoscopy;  Laterality: N/A;  DM STAT CBC, BMP COVID POSITIVE 09/02/2020   COLONOSCOPY WITH PROPOFOL N/A 06/19/2022   Procedure: COLONOSCOPY WITH PROPOFOL;  Surgeon: Regis Bill, MD;  Location: [facility];  Service: Endoscopy;  Laterality: N/A;   CTR     ESOPHAGOGASTRODUODENOSCOPY (EGD) WITH PROPOFOL N/A 10/10/2020   Procedure: ESOPHAGOGASTRODUODENOSCOPY (EGD) WITH PROPOFOL;  Surgeon: Regis Bill, MD;  Location: [facility]COPY;  Service: Endoscopy;  Laterality: N/A;  COVID POSITIVE 10/08/2020   FLEXIBLE SIGMOIDOSCOPY N/A 02/06/2022   Procedure: FLEXIBLE SIGMOIDOSCOPY;  Surgeon: Eather Colas  T, MD;  Location: [facility];  Service: Endoscopy;  Laterality: N/A;  Patient requests anesthesia   FLEXIBLE SIGMOIDOSCOPY N/A 12/19/2022   Procedure: FLEXIBLE SIGMOIDOSCOPY;  Surgeon: Dolores Frame, MD;  Location: [facility];  Service: Gastroenterology;  Laterality: N/A;   IR ANGIOGRAM SELECTIVE EACH ADDITIONAL VESSEL  02/07/2022   IR ANGIOGRAM SELECTIVE EACH ADDITIONAL VESSEL  02/07/2022   IR ANGIOGRAM SELECTIVE EACH ADDITIONAL VESSEL  09/05/2022   IR ANGIOGRAM VISCERAL SELECTIVE  02/07/2022   IR ANGIOGRAM VISCERAL  SELECTIVE  09/05/2022   IR CV LINE INJECTION  02/24/2023   IR EMBO ART  VEN HEMORR LYMPH EXTRAV  INC GUIDE ROADMAPPING  09/05/2022   IR EMBO ART  VEN HEMORR LYMPH EXTRAV  INC GUIDE ROADMAPPING  11/05/2022   IR EMBO ARTERIAL NOT HEMORR HEMANG INC GUIDE ROADMAPPING  02/07/2022   IR EMBO TUMOR ORGAN ISCHEMIA INFARCT INC GUIDE ROADMAPPING  01/06/2023   IR IMAGING GUIDED PORT INSERTION  06/13/2022   IR IMAGING GUIDED PORT INSERTION  11/25/2022   IR PARACENTESIS  12/17/2021   IR PARACENTESIS  03/25/2022   IR PARACENTESIS  11/25/2022   IR PARACENTESIS  01/06/2023   IR PARACENTESIS  01/19/2023   IR RADIOLOGIST EVAL & MGMT  03/18/2022   IR RADIOLOGIST EVAL & MGMT  05/07/2022   IR RADIOLOGIST EVAL & MGMT  05/16/2022   IR RADIOLOGIST EVAL & MGMT  10/13/2022   IR RADIOLOGIST EVAL & MGMT  12/12/2022   IR RADIOLOGIST EVAL & MGMT  03/30/2023   IR US GUIDE VASC ACCESS RIGHT  02/07/2022   IR US GUIDE VASC ACCESS RIGHT  09/05/2022   JOINT REPLACEMENT     KNEE SURGERY Right 09/24/2017   plates and pins   PORT-A-CATH REMOVAL N/A 09/23/2022   Procedure: MINOR REMOVAL PORT-A-CATH;  Surgeon: Franky Macho, MD;  Location: [facility];  Service: General;  Laterality: N/A;   TEE WITHOUT CARDIOVERSION N/A 09/23/2022   Procedure: TRANSESOPHAGEAL ECHOCARDIOGRAM (TEE);  Surgeon: Antoine Poche, MD;  Location: [facility];  Service: Endoscopy;  Laterality: N/A;   TOTAL SHOULDER ARTHROPLASTY Right 09/27/2015   Patient Active Problem List   Diagnosis Date Noted   Hepatic encephalopathy (HCC) 03/25/2023   Acute metabolic encephalopathy 03/25/2023   Acute cystitis with hematuria 03/25/2023   Acute hepatic encephalopathy (HCC) 01/19/2023   Pancytopenia (HCC) 01/06/2023   Anemia 01/06/2023   Internal hemorrhoid 01/06/2023   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   Symptomatic anemia 12/17/2022   Hypokalemia 11/06/2022   Acute on chronic anemia 11/03/2022   Hypotension 11/03/2022   AKI (acute kidney injury) (HCC) 10/15/2022   Rectal bleeding  06/17/2022   Obesity (BMI 30-39.9) 06/17/2022   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 mellitus (HCC) 06/17/2022   Grade IV internal hemorrhoids 05/16/2022   Thrombocytopenia (HCC) 03/05/2022   Anemia in chronic kidney disease (CKD) 02/07/2022   GI bleeding 02/04/2022   Chronic kidney disease, stage 3b (HCC) 02/04/2022   Depression with anxiety 02/04/2022   Liver cirrhosis secondary to NASH (HCC)    Insomnia 01/27/2022   Iron deficiency anemia due to chronic blood loss 02/13/2021   History of uterine cancer 02/13/2021   Decompensated hepatic cirrhosis (HCC) 10/25/2020   OSA on CPAP 10/25/2020   Acute respiratory failure with hypoxia (HCC) 09/10/2020   Closed right hip fracture (HCC) 09/02/2020   Type 2 diabetes mellitus with hyperlipidemia (HCC) 09/02/2020   Thalassemia minor    HLD (hyperlipidemia)    Acute kidney injury superimposed on CKD (HCC)  Depression     PCP: Einar Crow MD   REFERRING PROVIDER: Mariam Dollar PA-C  REFERRING DIAG: [phone] (ICD-10-CM) - Hepatic encephalopathy  THERAPY DIAG:  Muscle weakness (generalized)  Other abnormalities of gait and mobility  Acute hepatic encephalopathy [facility])  Rationale for Evaluation and Treatment: Rehabilitation  ONSET DATE: 01/06/23  SUBJECTIVE:   SUBJECTIVE STATEMENT: Patient states she has been having other health issues going on. HEP going well, doing a lot of the ones she can do at the counter. Patient states 50-60% improvement since beginning PT. Remains limited by balance and strength. Back pain with increased walking distance.  EVAL: Patient states continued weakness but not as bad as it was last time she was here. Leg swelling was down when she left the hospital and then went back up and is now going down again. She uses the cane around the house. Uses rollator with increased distance. Was weak in the hospital.   PERTINENT HISTORY: Falls, weakness, R knee pain, Cirrhosis, L foot ulcer, CKD,  neuropathy PAIN:  Are you having pain? No  PRECAUTIONS: Fall  WEIGHT BEARING RESTRICTIONS: No  FALLS:  Has patient fallen in last 6 months? No  LIVING ENVIRONMENT: Lives with: lives with their spouse Lives in: House/apartment Stairs: No Has following equipment at home: Single point cane and Environmental consultant - 2 wheeled  OCCUPATION: retired/disability  PLOF: Independent with household mobility with device and Needs assistance with ADLs  PATIENT GOALS: to get stronger   OBJECTIVE:   COGNITION: Overall cognitive status: Within functional limits for tasks assessed     SENSATION: WFL   POSTURE: rounded shoulders and forward head  PALPATION: Bilateral LE edema  LOWER EXTREMITY ROM:  Active ROM Right eval Left eval  Hip flexion    Hip extension    Hip abduction    Hip adduction    Hip internal rotation    Hip external rotation    Knee flexion    Knee extension    Ankle dorsiflexion    Ankle plantarflexion    Ankle inversion    Ankle eversion     (Blank rows = not tested)  LOWER EXTREMITY MMT:  MMT Right eval Left eval Right 04/02/23 Left 04/02/23  Hip flexion 4+ 4+ 5 5  Hip extension      Hip abduction      Hip adduction      Hip internal rotation      Hip external rotation      Knee flexion 4+ 4+ 5 5  Knee extension 4+ 4+ 5 5  Ankle dorsiflexion 5 5 5 5   Ankle plantarflexion      Ankle inversion      Ankle eversion       (Blank rows = not tested)    FUNCTIONAL TESTS:  2 minute walk test: 150 feet with rollator Dynamic Gait Index: 13/24  GAIT: Distance walked: 150 feet Assistive device utilized: Walker - 4 wheeled Level of assistance: Modified independence Comments:  DGI 1. Gait level surface (2) Mild Impairment: Walks 20', uses assistive devices, slower speed, mild gait deviations. 2. Change in gait speed (2) Mild Impairment: Is able to change speed but demonstrates mild gait deviations, or not gait deviations but unable to achieve a  significant change in velocity, or uses an assistive device. 3. Gait with horizontal head turns (1) Moderate Impairment: Performs head turns with moderate change in gait velocity, slows down, staggers but recovers, can continue to walk. 4. Gait with vertical head turns 1)  Moderate Impairment: Performs head turns with moderate change in gait velocity, slows down, staggers but recovers, can continue to walk. 5. Gait and pivot turn (2) Mild Impairment: Pivot turns safely in > 3 seconds and stops with no loss of balance. 6. Step over obstacle (1) Moderate Impairment: Is able to step over box but must stop, then step over. May require verbal cueing. 7. Step around obstacles (2) Mild Impairment: Is able to step around both cones, but must slow down and adjust steps to clear cones. 8. Stairs (1) Moderate Impairment: Two feet to a stair, must use rail.  TOTAL SCORE: 13 / 24   Reassessment 04/02/23 FUNCTIONAL TESTS:  2 minute walk test: 200 feet with SPC Dynamic Gait Index: 13/24  DGI 1. Gait level surface (2) Mild Impairment: Walks 20', uses assistive devices, slower speed, mild gait deviations. 2. Change in gait speed (2) Mild Impairment: Is able to change speed but demonstrates mild gait deviations, or not gait deviations but unable to achieve a significant change in velocity, or uses an assistive device. 3. Gait with horizontal head turns (1) Moderate Impairment: Performs head turns with moderate change in gait velocity, slows down, staggers but recovers, can continue to walk. 4. Gait with vertical head turns 1) Moderate Impairment: Performs head turns with moderate change in gait velocity, slows down, staggers but recovers, can continue to walk. 5. Gait and pivot turn (2) Mild Impairment: Pivot turns safely in > 3 seconds and stops with no loss of balance. 6. Step over obstacle (1) Moderate Impairment: Is able to step over box but must stop, then step over. May require verbal cueing. 7.  Step around obstacles (2) Mild Impairment: Is able to step around both cones, but must slow down and adjust steps to clear cones. 8. Stairs (1) Moderate Impairment: Two feet to a stair, must use rail.  TOTAL SCORE: 13 / 24   TODAY'S TREATMENT:                                                                                                                              DATE:  04/02/23 Reassessment Seated trunk flexion stretch 10 x 5-10 second holds   03/06/23 EVAL and HEP    PATIENT EDUCATION:  Education details: 04/02/23: HEP, reassessment findings. EVAL Patient educated on exam findings, POC, scope of PT, HEP. Person educated: Patient Education method: Explanation, Demonstration, and Handouts Education comprehension: verbalized understanding, returned demonstration, verbal cues required, and tactile cues required  HOME EXERCISE PROGRAM: Access Code: [email or web address] URL: [email or web address]  04/02/23 - Seated Flexion Stretch  - 3 x daily - 7 x weekly - 10 reps - 5-10 second hold  Date: 03/06/2023 - Seated Heel Toe Raises  - 2 x daily - 7 x weekly - 1 sets - 10 reps - Seated Long Arc Quad  - 2 x daily - 7 x weekly - 1 sets - 10 reps - Seated March  - 2 x daily -  7 x weekly - 1 sets - 10 reps - Supine Active Straight Leg Raise  - 7 x weekly - 1 sets - 10 reps - Heel Raises with Counter Support  - 2 x daily - 7 x weekly - 1 sets - 10 reps - 3-5" hold - Mini Squat with Counter Support  - 2 x daily - 7 x weekly - 1 sets - 10 reps - 3-5" hold - Sit to Stand with Counter Support  - 2 x daily - 7 x weekly - 1 sets - 10 reps  ASSESSMENT:  CLINICAL IMPRESSION: Patient has met 2/2 short term goals and 1/4 long term goals with ability to complete HEP and improvement in symptoms, strength. Remaining goals not met due to continued deficits in   activity tolerance, gait, balance, and functional mobility. Patient has made good progress toward remaining goals. Will continue to progress  strength and balance as able. Began trunk flexion stretch to see if patient notices decrease in lumbar symptoms with ambulation. Patient will continue to benefit from skilled physical therapy in order to improve function and reduce impairment.    OBJECTIVE IMPAIRMENTS: Abnormal gait, decreased activity tolerance, decreased balance, decreased endurance, decreased mobility, difficulty walking, decreased strength, and improper body mechanics.   ACTIVITY LIMITATIONS: carrying, lifting, bending, standing, squatting, stairs, transfers, bed mobility, locomotion level, and caring for others  PARTICIPATION LIMITATIONS: meal prep, cleaning, laundry, shopping, community activity, and yard work  PERSONAL FACTORS: Time since onset of injury/illness/exacerbation and 3+ comorbidities: Falls, weakness, R knee pain, Cirrhosis, L foot ulcer  are also affecting patient's functional outcome.   REHAB POTENTIAL: Good  CLINICAL DECISION MAKING: Stable/uncomplicated  EVALUATION COMPLEXITY: Low   GOALS: Goals reviewed with patient? Yes  SHORT TERM GOALS: Target date: 04/03/2023    Patient will be independent with HEP in order to improve functional outcomes. Baseline: Goal status: MET  2.  Patient will report at least 25% improvement in symptoms for improved quality of life. Baseline: Goal status: MET   LONG TERM GOALS: Target date: 05/01/2023    Patient will report at least 75% improvement in symptoms for improved quality of life. Baseline:  Goal status: INITIAL  2.  Patient will improve DGI score by at least 7 points in order to indicate improved balance for community ambulation. Baseline: 13/24 Goal status: INITIAL  3.  Patient will demonstrate grade of 5/5 MMT grade in all tested musculature as evidence of improved strength to assist with stair ambulation and gait.   Baseline: see above Goal status: MET  4.  Patient will be able to ambulate at least 226 feet in with LRAD in order to  demonstrate improved tolerance to activity. Baseline: 150 with rollator 04/02/23: 200 with SPC Goal status: INITIAL     PLAN:  PT FREQUENCY: 1-2x/week  PT DURATION: 8 weeks  PLANNED INTERVENTIONS: Therapeutic exercises, Therapeutic activity, Neuromuscular re-education, Balance training, Gait training, Patient/Family education, Joint manipulation, Joint mobilization, Stair training, Orthotic/Fit training, DME instructions, Aquatic Therapy, Dry Needling, Electrical stimulation, Spinal manipulation, Spinal mobilization, Cryotherapy, Moist heat, Compression bandaging, scar mobilization, Splintting, Taping, Traction, Ultrasound, Ionotophoresis 4mg /ml Dexamethasone, and Manual therapy  PLAN FOR NEXT SESSION: functional strength, endurance, test glute strength, balance training   Reola Mosher , PT 04/02/2023, 1:53 PM

## 2023-04-02 NOTE — Therapy (Signed)
OUTPATIENT OCCUPATIONAL THERAPY NEURO TREATMENT NOTE  Patient Name: Rachel Huber MRN: [id number] DOB:[birth year], 67 y.o., female Today's Date: 04/02/2023  PCP: Einar Crow, MD REFERRING PROVIDER: Cruzita Lederer, PA-C  END OF SESSION:  OT End of Session - 04/02/23 1416     Visit Number 4    Number of Visits 8    Date for OT Re-Evaluation 05/08/23    Authorization Type Medicare Part A and B    OT Start Time 1310    OT Stop Time 1349    OT Time Calculation (min) 39 min    Activity Tolerance Patient tolerated treatment well    Behavior During Therapy WFL for tasks assessed/performed            Past Medical History:  Diagnosis Date   Anemia in chronic kidney disease (CKD) 02/07/2022   Anxiety    Arthritis    Ascites of liver    Weekly paracentesis   Cirrhosis of liver (HCC)    CKD (chronic kidney disease) stage 4, GFR 15-29 ml/min (HCC)    GERD (gastroesophageal reflux disease)    Grade IV internal hemorrhoids    Hypertension    NASH (nonalcoholic steatohepatitis)    Neuropathy, diabetic (HCC)    Obesity (BMI 30-39.9)    OSA on CPAP    Pneumonia    Portal hypertension (HCC)    S/P TIPS (transjugular intrahepatic portosystemic shunt)    Thalassemia minor 1992   Past Surgical History:  Procedure Laterality Date   ABDOMINAL HYSTERECTOMY     BREAST BIOPSY Right 02/15/2018   [place] bx 6-6:30 ribbon shape, ONE CORE FRAGMENT WITH FIBROSIS. ONE CORE FRAGMENT WITH PORTION OF A DILATED   BREAST BIOPSY Right 02/15/2018   [place] bx 9:00 heart shape, USUAL DUCTAL HYPERPLASIA   BREAST LUMPECTOMY Right 03/09/2018   Procedure: BREAST LUMPECTOMY x 2;  Surgeon: Sung Amabile, DO;  Location: [facility];  Service: General;  Laterality: Right;   CATARACT EXTRACTION W/PHACO Left 06/11/2016   Procedure: CATARACT EXTRACTION PHACO AND INTRAOCULAR LENS PLACEMENT (IOC);  Surgeon: Sallee Lange, MD;  Location: [facility];  Service: Ophthalmology;  Laterality: Left;  Lot #  [id number] H US:01:38.6 AP%:26.4 CDE:44.15   CATARACT EXTRACTION W/PHACO Right 06/15/2018   Procedure: CATARACT EXTRACTION PHACO AND INTRAOCULAR LENS PLACEMENT (IOC);  Surgeon: Galen Manila, MD;  Location: [facility];  Service: Ophthalmology;  Laterality: Right;  [place] 00:38.2 CDE 4.23 Fluid Pack Lot # [id number]   COLONOSCOPIES     COLONOSCOPY WITH PROPOFOL N/A 10/10/2020   Procedure: COLONOSCOPY WITH PROPOFOL;  Surgeon: Regis Bill, MD;  Location: [facility];  Service: Endoscopy;  Laterality: N/A;   COLONOSCOPY WITH PROPOFOL N/A 11/20/2020   Procedure: COLONOSCOPY WITH PROPOFOL;  Surgeon: Regis Bill, MD;  Location: [facility];  Service: Endoscopy;  Laterality: N/A;  DM STAT CBC, BMP COVID POSITIVE 09/02/2020   COLONOSCOPY WITH PROPOFOL N/A 06/19/2022   Procedure: COLONOSCOPY WITH PROPOFOL;  Surgeon: Regis Bill, MD;  Location: [facility];  Service: Endoscopy;  Laterality: N/A;   CTR     ESOPHAGOGASTRODUODENOSCOPY (EGD) WITH PROPOFOL N/A 10/10/2020   Procedure: ESOPHAGOGASTRODUODENOSCOPY (EGD) WITH PROPOFOL;  Surgeon: Regis Bill, MD;  Location: [facility];  Service: Endoscopy;  Laterality: N/A;  COVID POSITIVE 10/08/2020   FLEXIBLE SIGMOIDOSCOPY N/A 02/06/2022   Procedure: FLEXIBLE SIGMOIDOSCOPY;  Surgeon: Regis Bill, MD;  Location: [facility];  Service: Endoscopy;  Laterality: N/A;  Patient requests anesthesia   FLEXIBLE SIGMOIDOSCOPY N/A 12/19/2022   Procedure: FLEXIBLE SIGMOIDOSCOPY;  Surgeon:  Dolores Frame, MD;  Location: [facility];  Service: Gastroenterology;  Laterality: N/A;   IR ANGIOGRAM SELECTIVE EACH ADDITIONAL VESSEL  02/07/2022   IR ANGIOGRAM SELECTIVE EACH ADDITIONAL VESSEL  02/07/2022   IR ANGIOGRAM SELECTIVE EACH ADDITIONAL VESSEL  09/05/2022   IR ANGIOGRAM VISCERAL SELECTIVE  02/07/2022   IR ANGIOGRAM VISCERAL SELECTIVE  09/05/2022   IR CV LINE INJECTION  02/24/2023   IR EMBO ART  VEN HEMORR LYMPH EXTRAV  INC  GUIDE ROADMAPPING  09/05/2022   IR EMBO ART  VEN HEMORR LYMPH EXTRAV  INC GUIDE ROADMAPPING  11/05/2022   IR EMBO ARTERIAL NOT HEMORR HEMANG INC GUIDE ROADMAPPING  02/07/2022   IR EMBO TUMOR ORGAN ISCHEMIA INFARCT INC GUIDE ROADMAPPING  01/06/2023   IR IMAGING GUIDED PORT INSERTION  06/13/2022   IR IMAGING GUIDED PORT INSERTION  11/25/2022   IR PARACENTESIS  12/17/2021   IR PARACENTESIS  03/25/2022   IR PARACENTESIS  11/25/2022   IR PARACENTESIS  01/06/2023   IR PARACENTESIS  01/19/2023   IR RADIOLOGIST EVAL & MGMT  03/18/2022   IR RADIOLOGIST EVAL & MGMT  05/07/2022   IR RADIOLOGIST EVAL & MGMT  05/16/2022   IR RADIOLOGIST EVAL & MGMT  10/13/2022   IR RADIOLOGIST EVAL & MGMT  12/12/2022   IR RADIOLOGIST EVAL & MGMT  03/30/2023   IR US GUIDE VASC ACCESS RIGHT  02/07/2022   IR US GUIDE VASC ACCESS RIGHT  09/05/2022   JOINT REPLACEMENT     KNEE SURGERY Right 09/24/2017   plates and pins   PORT-A-CATH REMOVAL N/A 09/23/2022   Procedure: MINOR REMOVAL PORT-A-CATH;  Surgeon: Franky Macho, MD;  Location: [facility];  Service: General;  Laterality: N/A;   TEE WITHOUT CARDIOVERSION N/A 09/23/2022   Procedure: TRANSESOPHAGEAL ECHOCARDIOGRAM (TEE);  Surgeon: Antoine Poche, MD;  Location: [facility];  Service: Endoscopy;  Laterality: N/A;   TOTAL SHOULDER ARTHROPLASTY Right 09/27/2015   Patient Active Problem List   Diagnosis Date Noted   Hepatic encephalopathy (HCC) 03/25/2023   Acute metabolic encephalopathy 03/25/2023   Acute cystitis with hematuria 03/25/2023   Acute hepatic encephalopathy (HCC) 01/19/2023   Pancytopenia (HCC) 01/06/2023   Anemia 01/06/2023   Internal hemorrhoid 01/06/2023   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   Symptomatic anemia 12/17/2022   Hypokalemia 11/06/2022   Acute on chronic anemia 11/03/2022   Hypotension 11/03/2022   AKI (acute kidney injury) (HCC) 10/15/2022   Rectal bleeding 06/17/2022   Obesity (BMI 30-39.9) 06/17/2022   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 mellitus (HCC)  06/17/2022   Grade IV internal hemorrhoids 05/16/2022   Thrombocytopenia (HCC) 03/05/2022   Anemia in chronic kidney disease (CKD) 02/07/2022   GI bleeding 02/04/2022   Chronic kidney disease, stage 3b (HCC) 02/04/2022   Depression with anxiety 02/04/2022   Liver cirrhosis secondary to NASH (HCC)    Insomnia 01/27/2022   Iron deficiency anemia due to chronic blood loss 02/13/2021   History of uterine cancer 02/13/2021   Decompensated hepatic cirrhosis (HCC) 10/25/2020   OSA on CPAP 10/25/2020   Acute respiratory failure with hypoxia (HCC) 09/10/2020   Closed right hip fracture (HCC) 09/02/2020   Type 2 diabetes mellitus with hyperlipidemia (HCC) 09/02/2020   Thalassemia minor    HLD (hyperlipidemia)    Acute kidney injury superimposed on CKD (HCC)    Depression     ONSET DATE: 01/06/23  REFERRING DIAG: Hepatic Encephalopathy  THERAPY DIAG:  Other lack of coordination - Plan: Ot plan of care  cert/re-cert  Other symptoms and signs involving the nervous system - Plan: Ot plan of care cert/re-cert  Rationale for Evaluation and Treatment: Rehabilitation  SUBJECTIVE:   SUBJECTIVE STATEMENT: "Things aren't too bad" Pt accompanied by: self  PERTINENT HISTORY: PMH significant for diabetes mellitus hypertension [name] status post TIPS revision at [facility] weekly paracentesis recurrent grade 4 internal hemorrhoids status post embolization of superior rectal artery 11/05/2022, multifactorial anemia, OSA with CPAP, CKD stage III, thrombocytopenia/leukopenia due to splenic sequestration/cirrhosis, beta thalassemia intermedia per hematology/oncology, hyperlipidemia, chronic orthostasis with ProAmatine.   PRECAUTIONS: None  WEIGHT BEARING RESTRICTIONS: No  PAIN:  Are you having pain? Yes: NPRS scale: 3/10 Pain location: anterior shoulder girdle Pain description: aching Aggravating factors: movement Relieving factors: Not moving it  FALLS: Has patient fallen in last 6  months? No  LIVING ENVIRONMENT: Lives with: lives with their spouse Lives in: House/apartment Has following equipment at home: Counselling psychologist, Environmental consultant - 4 wheeled, Tour manager, and Grab bars  PLOF: Independent with household mobility with device and Requires assistive device for independence  PATIENT GOALS: To strengthen my arms  OBJECTIVE:   HAND DOMINANCE: Right  ADLs: Overall ADLs: Due to weakness and fatigue pt requires assist with all IADL's and compensatory strategies for BADL's  MOBILITY STATUS:  Independent with rollator  POSTURE COMMENTS:  No Significant postural limitations Sitting balance:  Independent  ACTIVITY TOLERANCE: Activity tolerance: Decreased endurance in standing to complete tasks, gives out quickly  FUNCTIONAL OUTCOME MEASURES: Upper Extremity Functional Scale (UEFS): 60.0%  UPPER EXTREMITY ROM:    Pt has full ROM  UPPER EXTREMITY MMT:   RUE weaker than left  MMT Right eval Left eval Right 04/02/23 Left 04/02/23  Shoulder flexion 3+/5 4-/5 4/5 4+/5  Shoulder abduction 3+/5 4-/5 4/5 4+/5  Shoulder adduction 4-/5 4/5 4+/5 5/5  Shoulder extension 4/5 4/5 4+/5 4+/5  Shoulder internal rotation 4-/5 4/5 4-/5 4/5  Shoulder external rotation 4-/5 4/5 4/5 4+/5  Elbow flexion 4/5 4+/5 5/5 5/5  Elbow extension 4-/5 4/5 5/5 5/5  Wrist flexion 4/5 4/5 5/5 5/5  Wrist extension 3+/5 4/5 4/5 5/5  Wrist ulnar deviation 4-/5 4/5 5/5 5/5  Wrist radial deviation 3+/5 4/5 5/5 5/5  Wrist pronation 3+/5 4/5 4+/5 4+/5  Wrist supination 3+/5 4/5 4+/5 4+/5  (Blank rows = not tested)  HAND FUNCTION: Grip strength: Right: 37 lbs; Left: 35 lbs, Lateral pinch: Right: 11 lbs, Left: 10 lbs, and 3 point pinch: Right: 8 lbs, Left: 8 lbs  COORDINATION: 9 Hole Peg test: Right: 37.31 sec; Left: 47.45 sec  SENSATION: WFL  EDEMA: No swelling noted, pt reports occasionally when she is retaining fluid.   OBSERVATIONS: RUE increased stiffness and pain since  reverse total shoulder   TODAY'S TREATMENT:  DATE:   04/02/23 -AA/ROM: 2lb dowel rod, flexion, abduction, protraction, horizontal abduction, er/IR, x15 -A/ROM:  -Measurements for Reassessment -Pt completed toileting during session, requiring supervision to min guard assist due to fatigue and weakness.   03/19/23 -Pulleys: flexion and abduction, x60" each -Functional Reaching: seated, placing 15 pegs at shoulder-head height, BUE -Scapular Strengthening: red band, extension, retraction, protraction, rows, x12 -A/ROM: seated, flexion, abduction, protraction, horizontal abduction, er/IR, x15 -Manual Therapy: myofascial release and trigger point applied to BUE biceps, deltoids, trapezius, and scapular region, in order to reduce pain and fascial restrictions to improve ROM.  02/25/23 -Manual Therapy: myofascial release and trigger point applied to BUE biceps, deltoids, trapezius, and scapular region, in order to reduce pain and fascial restrictions to improve ROM. -AA/ROM: supine, BUE, flexion, abduction, protraction, horizontal abduction, er/IR, x10 -Proximal shoulder exercises: paddles, criss cross, circles both directions, x10   PATIENT EDUCATION: Education details: Shoulder strengthening with dumbbells Person educated: Patient Education method: Explanation Education comprehension: verbalized understanding  HOME EXERCISE PROGRAM: 7/10: AA/ROM 8/1: A/ROM 8/15: Shoulder strengthening with dumbbells   GOALS: Goals reviewed with patient? Yes  SHORT TERM GOALS: Target date: 04/03/23  Pt will be provided and educated on HEP for BUE mobility and strength for ADL completion.  Goal status: IN PROGRESS  2.  Pt will decrease pain in BUE to 3/10 or less in order to sleep for 3+ consecutive hours without waking due to pain.   Goal status: IN PROGRESS  3.  Pt will  increased BUE strength to 4+/5 in order to lift items to complete cooking and cleaning tasks.   Goal status: IN PROGRESS  4.  Pt will increase BUE grip strength by 10# and pinch strength by 3#.  Goal status: IN PROGRESS  5.  Pt will improve BUE coordination to 40" or less in order to manipulate small items during dressing and bathing tasks with no compensatory strategies or AE.   Goal status: IN PROGRESS    ASSESSMENT:  CLINICAL IMPRESSION: Pt was reassessed this session for recertification. She continues to demonstrate decreased strength and pain overall is limiting for all mobility and activity. This session she presents with increased fatigue and weakness, requiring multiple rest breaks and slowed speed for all exercises. OT providing verbal and tactile cuing throughout session for positioning and technique. Recommending pt continue skilled OT services 1 per week for 4 more weeks, continuing to focus on strength and endurance.   PERFORMANCE DEFICITS: in functional skills including ADLs, IADLs, coordination, dexterity, ROM, strength, pain, fascial restrictions, muscle spasms, Fine motor control, Gross motor control, body mechanics, and UE functional use.   PLAN:  OT FREQUENCY: 1x/week  OT DURATION: 6 weeks  PLANNED INTERVENTIONS: self care/ADL training, therapeutic exercise, therapeutic activity, manual therapy, passive range of motion, functional mobility training, electrical stimulation, ultrasound, paraffin, moist heat, patient/family education, coping strategies training, and DME and/or AE instructions  RECOMMENDED OTHER SERVICES: PT  CONSULTED AND AGREED WITH PLAN OF CARE: Patient  PLAN FOR NEXT SESSION: Manual Therapy as needed, A/ROM, coordination tasks, gripping tasks, pinching tasks.   Trish Mage, OTR/L [facility] [phone]  Rosemarie Beath, OT 04/02/2023, 2:18 PM

## 2023-04-06 ENCOUNTER — Inpatient Hospital Stay: Payer: Medicare Other

## 2023-04-06 DIAGNOSIS — D61818 Other pancytopenia: Secondary | ICD-10-CM

## 2023-04-06 DIAGNOSIS — D500 Iron deficiency anemia secondary to blood loss (chronic): Secondary | ICD-10-CM

## 2023-04-06 LAB — CBC
HCT: 23.3 % — ABNORMAL LOW (ref 36.0–46.0)
Hemoglobin: 7.4 g/dL — ABNORMAL LOW (ref 12.0–15.0)
MCH: 23.2 pg — ABNORMAL LOW (ref 26.0–34.0)
MCHC: 31.8 g/dL (ref 30.0–36.0)
MCV: 73 fL — ABNORMAL LOW (ref 80.0–100.0)
Platelets: 189 10*3/uL (ref 150–400)
RBC: 3.19 MIL/uL — ABNORMAL LOW (ref 3.87–5.11)
RDW: 23.2 % — ABNORMAL HIGH (ref 11.5–15.5)
WBC: 4.5 10*3/uL (ref 4.0–10.5)
nRBC: 0 % (ref 0.0–0.2)

## 2023-04-06 LAB — SAMPLE TO BLOOD BANK

## 2023-04-06 MED ORDER — HEPARIN SOD (PORK) LOCK FLUSH 100 UNIT/ML IV SOLN
500.0000 [IU] | Freq: Once | INTRAVENOUS | Status: AC
  Administered 2023-04-06: 500 [IU] via INTRAVENOUS

## 2023-04-06 MED ORDER — SODIUM CHLORIDE 0.9% FLUSH
10.0000 mL | Freq: Once | INTRAVENOUS | Status: AC
  Administered 2023-04-06: 10 mL via INTRAVENOUS

## 2023-04-06 NOTE — Progress Notes (Signed)
Patients port flushed without difficulty.  Good blood return noted with no bruising or swelling noted at site.  Band aid applied.  VSS with discharge and left in satisfactory condition with no s/s of distress noted.   

## 2023-04-07 ENCOUNTER — Ambulatory Visit
Admission: RE | Admit: 2023-04-07 | Discharge: 2023-04-07 | Disposition: A | Discharge status: Home / Self Care | Payer: Medicare Other | Source: Ambulatory Visit | Attending: Gastroenterology | Admitting: Gastroenterology

## 2023-04-07 ENCOUNTER — Other Ambulatory Visit: Payer: Self-pay | Admitting: Gastroenterology

## 2023-04-07 DIAGNOSIS — K7581 Nonalcoholic steatohepatitis (NASH): Secondary | ICD-10-CM | POA: Diagnosis present

## 2023-04-07 DIAGNOSIS — K7460 Unspecified cirrhosis of liver: Secondary | ICD-10-CM | POA: Insufficient documentation

## 2023-04-08 ENCOUNTER — Other Ambulatory Visit: Payer: Self-pay | Admitting: Gastroenterology

## 2023-04-08 DIAGNOSIS — K7460 Unspecified cirrhosis of liver: Secondary | ICD-10-CM

## 2023-04-09 ENCOUNTER — Ambulatory Visit (HOSPITAL_COMMUNITY): Payer: Medicare Other

## 2023-04-09 DIAGNOSIS — R2689 Other abnormalities of gait and mobility: Secondary | ICD-10-CM

## 2023-04-09 DIAGNOSIS — M6281 Muscle weakness (generalized): Secondary | ICD-10-CM

## 2023-04-09 DIAGNOSIS — R278 Other lack of coordination: Secondary | ICD-10-CM | POA: Diagnosis not present

## 2023-04-09 NOTE — Therapy (Signed)
OUTPATIENT PHYSICAL THERAPY LOWER TREATMENT   Patient Name: Rachel Huber MRN: [id number] DOB:1955/08/20, 67 y.o., female Today's Date: 04/09/2023  Progress Note   Reporting Period 03/06/23 to 04/02/23   See note below for Objective Data and Assessment of Progress/Goals    END OF SESSION:  PT End of Session - 04/09/23 1357     Visit Number 3    Number of Visits 16   1-2x/week for 8 weeks   Date for PT Re-Evaluation 05/01/23    Authorization Type Medicare A/ Secondary: [employer]    Progress Note Due on Visit 10    PT Start Time 1355   late check in   PT Stop Time 1430    PT Time Calculation (min) 35 min    Activity Tolerance Patient tolerated treatment well    Behavior During Therapy [facility] for tasks assessed/performed             Past Medical History:  Diagnosis Date   Anemia in chronic kidney disease (CKD) 02/07/2022   Anxiety    Arthritis    Ascites of liver    Weekly paracentesis   Cirrhosis of liver (HCC)    CKD (chronic kidney disease) stage 4, GFR 15-29 ml/min (HCC)    GERD (gastroesophageal reflux disease)    Grade IV internal hemorrhoids    Hypertension    NASH (nonalcoholic steatohepatitis)    Neuropathy, diabetic (HCC)    Obesity (BMI 30-39.9)    OSA on CPAP    Pneumonia    Portal hypertension (HCC)    S/P TIPS (transjugular intrahepatic portosystemic shunt)    Thalassemia minor 1992   Past Surgical History:  Procedure Laterality Date   ABDOMINAL HYSTERECTOMY     BREAST BIOPSY Right 02/15/2018   [place] bx 6-6:30 ribbon shape, ONE CORE FRAGMENT WITH FIBROSIS. ONE CORE FRAGMENT WITH PORTION OF A DILATED   BREAST BIOPSY Right 02/15/2018   [place] bx 9:00 heart shape, USUAL DUCTAL HYPERPLASIA   BREAST LUMPECTOMY Right 03/09/2018   Procedure: BREAST LUMPECTOMY x 2;  Surgeon: Sung Amabile, DO;  Location: [facility];  Service: General;  Laterality: Right;   CATARACT EXTRACTION W/PHACO Left 06/11/2016   Procedure: CATARACT EXTRACTION PHACO AND INTRAOCULAR LENS  PLACEMENT (IOC);  Surgeon: Sallee Lange, MD;  Location: [facility];  Service: Ophthalmology;  Laterality: Left;  Lot # [id number] H US:01:38.6 AP%:26.4 CDE:44.15   CATARACT EXTRACTION W/PHACO Right 06/15/2018   Procedure: CATARACT EXTRACTION PHACO AND INTRAOCULAR LENS PLACEMENT (IOC);  Surgeon: Galen Manila, MD;  Location: [facility];  Service: Ophthalmology;  Laterality: Right;  [place] 00:38.2 CDE 4.23 Fluid Pack Lot # [id number]   COLONOSCOPIES     COLONOSCOPY WITH PROPOFOL N/A 10/10/2020   Procedure: COLONOSCOPY WITH PROPOFOL;  Surgeon: Regis Bill, MD;  Location: [facility];  Service: Endoscopy;  Laterality: N/A;   COLONOSCOPY WITH PROPOFOL N/A 11/20/2020   Procedure: COLONOSCOPY WITH PROPOFOL;  Surgeon: Regis Bill, MD;  Location: [facility];  Service: Endoscopy;  Laterality: N/A;  DM STAT CBC, BMP COVID POSITIVE 09/02/2020   COLONOSCOPY WITH PROPOFOL N/A 06/19/2022   Procedure: COLONOSCOPY WITH PROPOFOL;  Surgeon: Regis Bill, MD;  Location: [facility];  Service: Endoscopy;  Laterality: N/A;   CTR     ESOPHAGOGASTRODUODENOSCOPY (EGD) WITH PROPOFOL N/A 10/10/2020   Procedure: ESOPHAGOGASTRODUODENOSCOPY (EGD) WITH PROPOFOL;  Surgeon: Regis Bill, MD;  Location: [facility];  Service: Endoscopy;  Laterality: N/A;  COVID POSITIVE 10/08/2020   FLEXIBLE SIGMOIDOSCOPY N/A 02/06/2022   Procedure: FLEXIBLE SIGMOIDOSCOPY;  Surgeon: Regis Bill, MD;  Location: [facility]COPY;  Service: Endoscopy;  Laterality: N/A;  Patient requests anesthesia   FLEXIBLE SIGMOIDOSCOPY N/A 12/19/2022   Procedure: FLEXIBLE SIGMOIDOSCOPY;  Surgeon: Dolores Frame, MD;  Location: [facility];  Service: Gastroenterology;  Laterality: N/A;   IR ANGIOGRAM SELECTIVE EACH ADDITIONAL VESSEL  02/07/2022   IR ANGIOGRAM SELECTIVE EACH ADDITIONAL VESSEL  02/07/2022   IR ANGIOGRAM SELECTIVE EACH ADDITIONAL VESSEL  09/05/2022   IR ANGIOGRAM VISCERAL SELECTIVE  02/07/2022   IR  ANGIOGRAM VISCERAL SELECTIVE  09/05/2022   IR CV LINE INJECTION  02/24/2023   IR EMBO ART  VEN HEMORR LYMPH EXTRAV  INC GUIDE ROADMAPPING  09/05/2022   IR EMBO ART  VEN HEMORR LYMPH EXTRAV  INC GUIDE ROADMAPPING  11/05/2022   IR EMBO ARTERIAL NOT HEMORR HEMANG INC GUIDE ROADMAPPING  02/07/2022   IR EMBO TUMOR ORGAN ISCHEMIA INFARCT INC GUIDE ROADMAPPING  01/06/2023   IR IMAGING GUIDED PORT INSERTION  06/13/2022   IR IMAGING GUIDED PORT INSERTION  11/25/2022   IR PARACENTESIS  12/17/2021   IR PARACENTESIS  03/25/2022   IR PARACENTESIS  11/25/2022   IR PARACENTESIS  01/06/2023   IR PARACENTESIS  01/19/2023   IR RADIOLOGIST EVAL & MGMT  03/18/2022   IR RADIOLOGIST EVAL & MGMT  05/07/2022   IR RADIOLOGIST EVAL & MGMT  05/16/2022   IR RADIOLOGIST EVAL & MGMT  10/13/2022   IR RADIOLOGIST EVAL & MGMT  12/12/2022   IR RADIOLOGIST EVAL & MGMT  03/30/2023   IR US GUIDE VASC ACCESS RIGHT  02/07/2022   IR US GUIDE VASC ACCESS RIGHT  09/05/2022   JOINT REPLACEMENT     KNEE SURGERY Right 09/24/2017   plates and pins   PORT-A-CATH REMOVAL N/A 09/23/2022   Procedure: MINOR REMOVAL PORT-A-CATH;  Surgeon: Franky Macho, MD;  Location: [facility];  Service: General;  Laterality: N/A;   TEE WITHOUT CARDIOVERSION N/A 09/23/2022   Procedure: TRANSESOPHAGEAL ECHOCARDIOGRAM (TEE);  Surgeon: Antoine Poche, MD;  Location: [facility];  Service: Endoscopy;  Laterality: N/A;   TOTAL SHOULDER ARTHROPLASTY Right 09/27/2015   Patient Active Problem List   Diagnosis Date Noted   Hepatic encephalopathy (HCC) 03/25/2023   Acute metabolic encephalopathy 03/25/2023   Acute cystitis with hematuria 03/25/2023   Acute hepatic encephalopathy (HCC) 01/19/2023   Pancytopenia (HCC) 01/06/2023   Anemia 01/06/2023   Internal hemorrhoid 01/06/2023   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   Symptomatic anemia 12/17/2022   Hypokalemia 11/06/2022   Acute on chronic anemia 11/03/2022   Hypotension 11/03/2022   AKI (acute kidney injury) (HCC) 10/15/2022   Rectal  bleeding 06/17/2022   Obesity (BMI 30-39.9) 06/17/2022   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 mellitus (HCC) 06/17/2022   Grade IV internal hemorrhoids 05/16/2022   Thrombocytopenia (HCC) 03/05/2022   Anemia in chronic kidney disease (CKD) 02/07/2022   GI bleeding 02/04/2022   Chronic kidney disease, stage 3b (HCC) 02/04/2022   Depression with anxiety 02/04/2022   Liver cirrhosis secondary to NASH (HCC)    Insomnia 01/27/2022   Iron deficiency anemia due to chronic blood loss 02/13/2021   History of uterine cancer 02/13/2021   Decompensated hepatic cirrhosis (HCC) 10/25/2020   OSA on CPAP 10/25/2020   Acute respiratory failure with hypoxia (HCC) 09/10/2020   Closed right hip fracture (HCC) 09/02/2020   Type 2 diabetes mellitus with hyperlipidemia (HCC) 09/02/2020   Thalassemia minor    HLD (hyperlipidemia)    Acute kidney injury superimposed on  CKD [facility])    Depression     PCP: Einar Crow MD   REFERRING PROVIDER: Mariam Dollar PA-C  REFERRING DIAG: [phone] (ICD-10-CM) - Hepatic encephalopathy  THERAPY DIAG:  Muscle weakness (generalized)  Other abnormalities of gait and mobility  Rationale for Evaluation and Treatment: Rehabilitation  ONSET DATE: 01/06/23  SUBJECTIVE:   SUBJECTIVE STATEMENT: "Getting better" some twinging in right knee and right shoulder but otherwise ok.  Has not had to have paracentesis in the past 3 weeks or so.     EVAL: Patient states continued weakness but not as bad as it was last time she was here. Leg swelling was down when she left the hospital and then went back up and is now going down again. She uses the cane around the house. Uses rollator with increased distance. Was weak in the hospital.   PERTINENT HISTORY: Falls, weakness, R knee pain, Cirrhosis, L foot ulcer, CKD, neuropathy PAIN:  Are you having pain? No  PRECAUTIONS: Fall  WEIGHT BEARING RESTRICTIONS: No  FALLS:  Has patient fallen in last 6 months? No  LIVING  ENVIRONMENT: Lives with: lives with their spouse Lives in: House/apartment Stairs: No Has following equipment at home: Single point cane and Environmental consultant - 2 wheeled  OCCUPATION: retired/disability  PLOF: Independent with household mobility with device and Needs assistance with ADLs  PATIENT GOALS: to get stronger   OBJECTIVE:   COGNITION: Overall cognitive status: Within functional limits for tasks assessed     SENSATION: WFL   POSTURE: rounded shoulders and forward head  PALPATION: Bilateral LE edema  LOWER EXTREMITY ROM:  Active ROM Right eval Left eval  Hip flexion    Hip extension    Hip abduction    Hip adduction    Hip internal rotation    Hip external rotation    Knee flexion    Knee extension    Ankle dorsiflexion    Ankle plantarflexion    Ankle inversion    Ankle eversion     (Blank rows = not tested)  LOWER EXTREMITY MMT:  MMT Right eval Left eval Right 04/02/23 Left 04/02/23  Hip flexion 4+ 4+ 5 5  Hip extension      Hip abduction      Hip adduction      Hip internal rotation      Hip external rotation      Knee flexion 4+ 4+ 5 5  Knee extension 4+ 4+ 5 5  Ankle dorsiflexion 5 5 5 5   Ankle plantarflexion      Ankle inversion      Ankle eversion       (Blank rows = not tested)    FUNCTIONAL TESTS:  2 minute walk test: 150 feet with rollator Dynamic Gait Index: 13/24  GAIT: Distance walked: 150 feet Assistive device utilized: Walker - 4 wheeled Level of assistance: Modified independence Comments:  DGI 1. Gait level surface (2) Mild Impairment: Walks 20', uses assistive devices, slower speed, mild gait deviations. 2. Change in gait speed (2) Mild Impairment: Is able to change speed but demonstrates mild gait deviations, or not gait deviations but unable to achieve a significant change in velocity, or uses an assistive device. 3. Gait with horizontal head turns (1) Moderate Impairment: Performs head turns with moderate change  in gait velocity, slows down, staggers but recovers, can continue to walk. 4. Gait with vertical head turns 1) Moderate Impairment: Performs head turns with moderate change in gait velocity, slows down, staggers but  recovers, can continue to walk. 5. Gait and pivot turn (2) Mild Impairment: Pivot turns safely in > 3 seconds and stops with no loss of balance. 6. Step over obstacle (1) Moderate Impairment: Is able to step over box but must stop, then step over. May require verbal cueing. 7. Step around obstacles (2) Mild Impairment: Is able to step around both cones, but must slow down and adjust steps to clear cones. 8. Stairs (1) Moderate Impairment: Two feet to a stair, must use rail.  TOTAL SCORE: 13 / 24   Reassessment 04/02/23 FUNCTIONAL TESTS:  2 minute walk test: 200 feet with SPC Dynamic Gait Index: 13/24  DGI 1. Gait level surface (2) Mild Impairment: Walks 20', uses assistive devices, slower speed, mild gait deviations. 2. Change in gait speed (2) Mild Impairment: Is able to change speed but demonstrates mild gait deviations, or not gait deviations but unable to achieve a significant change in velocity, or uses an assistive device. 3. Gait with horizontal head turns (1) Moderate Impairment: Performs head turns with moderate change in gait velocity, slows down, staggers but recovers, can continue to walk. 4. Gait with vertical head turns 1) Moderate Impairment: Performs head turns with moderate change in gait velocity, slows down, staggers but recovers, can continue to walk. 5. Gait and pivot turn (2) Mild Impairment: Pivot turns safely in > 3 seconds and stops with no loss of balance. 6. Step over obstacle (1) Moderate Impairment: Is able to step over box but must stop, then step over. May require verbal cueing. 7. Step around obstacles (2) Mild Impairment: Is able to step around both cones, but must slow down and adjust steps to clear cones. 8. Stairs (1) Moderate  Impairment: Two feet to a stair, must use rail.  TOTAL SCORE: 13 / 24   TODAY'S TREATMENT:                                                                                                                              DATE:  04/09/23 Standing: Heel raises x 10 with 1 UE assist Toe raises x 10 with 1 UE assist 4" box step ups x 10 each Mini squats x 10 Hip abduction 2 x 10 each Hip extension 2 x 10 each Tandem stance 2 x 30"   04/02/23 Reassessment Seated trunk flexion stretch 10 x 5-10 second holds   03/06/23 EVAL and HEP    PATIENT EDUCATION:  Education details: 04/02/23: HEP, reassessment findings. EVAL Patient educated on exam findings, POC, scope of PT, HEP. Person educated: Patient Education method: Explanation, Demonstration, and Handouts Education comprehension: verbalized understanding, returned demonstration, verbal cues required, and tactile cues required  HOME EXERCISE PROGRAM: Access Code: [email or web address] URL: [email or web address]  04/02/23 - Seated Flexion Stretch  - 3 x daily - 7 x weekly - 10 reps - 5-10 second hold  Date: 03/06/2023 - Seated Heel Toe Raises  - 2 x daily - 7 x weekly -  1 sets - 10 reps - Seated Long Arc Quad  - 2 x daily - 7 x weekly - 1 sets - 10 reps - Seated March  - 2 x daily - 7 x weekly - 1 sets - 10 reps - Supine Active Straight Leg Raise  - 7 x weekly - 1 sets - 10 reps - Heel Raises with Counter Support  - 2 x daily - 7 x weekly - 1 sets - 10 reps - 3-5" hold - Mini Squat with Counter Support  - 2 x daily - 7 x weekly - 1 sets - 10 reps - 3-5" hold - Sit to Stand with Counter Support  - 2 x daily - 7 x weekly - 1 sets - 10 reps  ASSESSMENT:  CLINICAL IMPRESSION: Patient arrived 10 min late for appt; Today's session's focus was on lower extremity strengthening and balance.  Needs cues for posturing with hip exercise and needs cues for technique with mini squats.   Some mild right knee discomfort with mini squats noted.  But  needs only a few rest breaks during treatment today.  Good balance challenge with tandem stance today.    Patient will continue to benefit from skilled physical therapy in order to improve function and reduce impairment.    OBJECTIVE IMPAIRMENTS: Abnormal gait, decreased activity tolerance, decreased balance, decreased endurance, decreased mobility, difficulty walking, decreased strength, and improper body mechanics.   ACTIVITY LIMITATIONS: carrying, lifting, bending, standing, squatting, stairs, transfers, bed mobility, locomotion level, and caring for others  PARTICIPATION LIMITATIONS: meal prep, cleaning, laundry, shopping, community activity, and yard work  PERSONAL FACTORS: Time since onset of injury/illness/exacerbation and 3+ comorbidities: Falls, weakness, R knee pain, Cirrhosis, L foot ulcer  are also affecting patient's functional outcome.   REHAB POTENTIAL: Good  CLINICAL DECISION MAKING: Stable/uncomplicated  EVALUATION COMPLEXITY: Low   GOALS: Goals reviewed with patient? Yes  SHORT TERM GOALS: Target date: 04/03/2023    Patient will be independent with HEP in order to improve functional outcomes. Baseline: Goal status: MET  2.  Patient will report at least 25% improvement in symptoms for improved quality of life. Baseline: Goal status: MET   LONG TERM GOALS: Target date: 05/01/2023    Patient will report at least 75% improvement in symptoms for improved quality of life. Baseline:  Goal status: INITIAL  2.  Patient will improve DGI score by at least 7 points in order to indicate improved balance for community ambulation. Baseline: 13/24 Goal status: INITIAL  3.  Patient will demonstrate grade of 5/5 MMT grade in all tested musculature as evidence of improved strength to assist with stair ambulation and gait.   Baseline: see above Goal status: MET  4.  Patient will be able to ambulate at least 226 feet in with LRAD in order to demonstrate improved  tolerance to activity. Baseline: 150 with rollator 04/02/23: 200 with SPC Goal status: INITIAL     PLAN:  PT FREQUENCY: 1-2x/week  PT DURATION: 8 weeks  PLANNED INTERVENTIONS: Therapeutic exercises, Therapeutic activity, Neuromuscular re-education, Balance training, Gait training, Patient/Family education, Joint manipulation, Joint mobilization, Stair training, Orthotic/Fit training, DME instructions, Aquatic Therapy, Dry Needling, Electrical stimulation, Spinal manipulation, Spinal mobilization, Cryotherapy, Moist heat, Compression bandaging, scar mobilization, Splintting, Taping, Traction, Ultrasound, Ionotophoresis 4mg /ml Dexamethasone, and Manual therapy  PLAN FOR NEXT SESSION: functional strength, endurance, test glute strength, balance training  1:58 PM, 04/09/23 Mckoy Bhakta Small Taunia Frasco MPT [place] [facility] [place] [phone] Ph:[phone]

## 2023-04-13 ENCOUNTER — Inpatient Hospital Stay: Payer: Medicare Other

## 2023-04-13 DIAGNOSIS — D500 Iron deficiency anemia secondary to blood loss (chronic): Secondary | ICD-10-CM | POA: Diagnosis not present

## 2023-04-13 DIAGNOSIS — D61818 Other pancytopenia: Secondary | ICD-10-CM

## 2023-04-13 LAB — CBC
HCT: 22.2 % — ABNORMAL LOW (ref 36.0–46.0)
Hemoglobin: 7 g/dL — ABNORMAL LOW (ref 12.0–15.0)
MCH: 22.7 pg — ABNORMAL LOW (ref 26.0–34.0)
MCHC: 31.5 g/dL (ref 30.0–36.0)
MCV: 71.8 fL — ABNORMAL LOW (ref 80.0–100.0)
Platelets: 221 10*3/uL (ref 150–400)
RBC: 3.09 MIL/uL — ABNORMAL LOW (ref 3.87–5.11)
RDW: 22.5 % — ABNORMAL HIGH (ref 11.5–15.5)
WBC: 4.6 10*3/uL (ref 4.0–10.5)
nRBC: 0 % (ref 0.0–0.2)

## 2023-04-13 LAB — SAMPLE TO BLOOD BANK

## 2023-04-13 LAB — PREPARE RBC (CROSSMATCH)

## 2023-04-13 LAB — AMMONIA: Ammonia: 110 umol/L — ABNORMAL HIGH (ref 9–35)

## 2023-04-13 MED ORDER — SODIUM CHLORIDE 0.9% FLUSH
10.0000 mL | Freq: Once | INTRAVENOUS | Status: AC
  Administered 2023-04-13: 10 mL via INTRAVENOUS

## 2023-04-13 MED ORDER — HEPARIN SOD (PORK) LOCK FLUSH 100 UNIT/ML IV SOLN
500.0000 [IU] | Freq: Once | INTRAVENOUS | Status: AC
  Administered 2023-04-13: 500 [IU] via INTRAVENOUS

## 2023-04-13 NOTE — Progress Notes (Signed)
Patients port flushed without difficulty.  Good blood return noted with no bruising or swelling noted at site.  Band aid applied.  VSS with discharge and left in satisfactory condition with no s/s of distress noted.   

## 2023-04-13 NOTE — Patient Instructions (Signed)

## 2023-04-13 NOTE — Progress Notes (Signed)
Pts hgb 7.0.  Per Dr. Marice Potter standing orders, 1 unit PRBC ordered.

## 2023-04-13 NOTE — Addendum Note (Signed)
Addended by: Mickie Bail on: 04/13/2023 04:31 PM   Modules accepted: Orders

## 2023-04-14 ENCOUNTER — Ambulatory Visit
Admission: RE | Admit: 2023-04-14 | Discharge: 2023-04-14 | Disposition: A | Discharge status: Home / Self Care | Payer: Medicare Other | Source: Ambulatory Visit | Attending: Gastroenterology | Admitting: Gastroenterology

## 2023-04-14 ENCOUNTER — Encounter: Payer: Self-pay | Admitting: *Deleted

## 2023-04-14 LAB — TYPE AND SCREEN
ABO/RH(D): A POS
Antibody Screen: NEGATIVE
Unit division: 0

## 2023-04-14 LAB — BPAM RBC
Blood Product Expiration Date: 202409232359
Unit Type and Rh: 6200

## 2023-04-14 NOTE — Progress Notes (Signed)
Dr. Ellin Saba does not want patient to have blood unless she is under 7.0.  Patient aware that she does not need blood.  She will follow up next week as scheduled.

## 2023-04-16 ENCOUNTER — Ambulatory Visit (HOSPITAL_COMMUNITY): Payer: Medicare Other | Admitting: Occupational Therapy

## 2023-04-16 ENCOUNTER — Encounter (HOSPITAL_COMMUNITY): Payer: Self-pay

## 2023-04-16 ENCOUNTER — Ambulatory Visit (HOSPITAL_COMMUNITY): Payer: Medicare Other

## 2023-04-16 ENCOUNTER — Encounter (HOSPITAL_COMMUNITY): Payer: Self-pay | Admitting: Occupational Therapy

## 2023-04-16 DIAGNOSIS — R2689 Other abnormalities of gait and mobility: Secondary | ICD-10-CM

## 2023-04-16 DIAGNOSIS — R278 Other lack of coordination: Secondary | ICD-10-CM | POA: Diagnosis not present

## 2023-04-16 DIAGNOSIS — M6281 Muscle weakness (generalized): Secondary | ICD-10-CM

## 2023-04-16 DIAGNOSIS — R29818 Other symptoms and signs involving the nervous system: Secondary | ICD-10-CM

## 2023-04-16 NOTE — Therapy (Signed)
OUTPATIENT PHYSICAL THERAPY LOWER TREATMENT   Patient Name: Rachel Huber MRN: [id number] DOB:[birth year], 67 y.o., female Today's Date: 04/16/2023  END OF SESSION:  PT End of Session - 04/16/23 1346     Visit Number 4    Number of Visits 16    Date for PT Re-Evaluation 05/01/23    Authorization Type Medicare A/ Secondary: [employer]    Authorization - Number of Visits 35    Progress Note Due on Visit 10    PT Start Time 1400   restroom break, delayed start   PT Stop Time 1439    PT Time Calculation (min) 39 min    Activity Tolerance Patient tolerated treatment well    Behavior During Therapy WFL for tasks assessed/performed             Past Medical History:  Diagnosis Date   Anemia in chronic kidney disease (CKD) 02/07/2022   Anxiety    Arthritis    Ascites of liver    Weekly paracentesis   Cirrhosis of liver (HCC)    CKD (chronic kidney disease) stage 4, GFR 15-29 ml/min (HCC)    GERD (gastroesophageal reflux disease)    Grade IV internal hemorrhoids    Hypertension    NASH (nonalcoholic steatohepatitis)    Neuropathy, diabetic (HCC)    Obesity (BMI 30-39.9)    OSA on CPAP    Pneumonia    Portal hypertension (HCC)    S/P TIPS (transjugular intrahepatic portosystemic shunt)    Thalassemia minor 1992   Past Surgical History:  Procedure Laterality Date   ABDOMINAL HYSTERECTOMY     BREAST BIOPSY Right 02/15/2018   [place] bx 6-6:30 ribbon shape, ONE CORE FRAGMENT WITH FIBROSIS. ONE CORE FRAGMENT WITH PORTION OF A DILATED   BREAST BIOPSY Right 02/15/2018   [place] bx 9:00 heart shape, USUAL DUCTAL HYPERPLASIA   BREAST LUMPECTOMY Right 03/09/2018   Procedure: BREAST LUMPECTOMY x 2;  Surgeon: Sung Amabile, DO;  Location: [facility];  Service: General;  Laterality: Right;   CATARACT EXTRACTION W/PHACO Left 06/11/2016   Procedure: CATARACT EXTRACTION PHACO AND INTRAOCULAR LENS PLACEMENT (IOC);  Surgeon: Sallee Lange, MD;  Location: [facility];  Service: Ophthalmology;   Laterality: Left;  Lot # [id number] H US:01:38.6 AP%:26.4 CDE:44.15   CATARACT EXTRACTION W/PHACO Right 06/15/2018   Procedure: CATARACT EXTRACTION PHACO AND INTRAOCULAR LENS PLACEMENT (IOC);  Surgeon: Galen Manila, MD;  Location: [facility];  Service: Ophthalmology;  Laterality: Right;  [place] 00:38.2 CDE 4.23 Fluid Pack Lot # [id number]   COLONOSCOPIES     COLONOSCOPY WITH PROPOFOL N/A 10/10/2020   Procedure: COLONOSCOPY WITH PROPOFOL;  Surgeon: Regis Bill, MD;  Location: [facility];  Service: Endoscopy;  Laterality: N/A;   COLONOSCOPY WITH PROPOFOL N/A 11/20/2020   Procedure: COLONOSCOPY WITH PROPOFOL;  Surgeon: Regis Bill, MD;  Location: [facility];  Service: Endoscopy;  Laterality: N/A;  DM STAT CBC, BMP COVID POSITIVE 09/02/2020   COLONOSCOPY WITH PROPOFOL N/A 06/19/2022   Procedure: COLONOSCOPY WITH PROPOFOL;  Surgeon: Regis Bill, MD;  Location: [facility];  Service: Endoscopy;  Laterality: N/A;   CTR     ESOPHAGOGASTRODUODENOSCOPY (EGD) WITH PROPOFOL N/A 10/10/2020   Procedure: ESOPHAGOGASTRODUODENOSCOPY (EGD) WITH PROPOFOL;  Surgeon: Regis Bill, MD;  Location: [facility];  Service: Endoscopy;  Laterality: N/A;  COVID POSITIVE 10/08/2020   FLEXIBLE SIGMOIDOSCOPY N/A 02/06/2022   Procedure: FLEXIBLE SIGMOIDOSCOPY;  Surgeon: Regis Bill, MD;  Location: [facility];  Service: Endoscopy;  Laterality: N/A;  Patient requests  anesthesia   FLEXIBLE SIGMOIDOSCOPY N/A 12/19/2022   Procedure: FLEXIBLE SIGMOIDOSCOPY;  Surgeon: Dolores Frame, MD;  Location: [facility];  Service: Gastroenterology;  Laterality: N/A;   IR ANGIOGRAM SELECTIVE EACH ADDITIONAL VESSEL  02/07/2022   IR ANGIOGRAM SELECTIVE EACH ADDITIONAL VESSEL  02/07/2022   IR ANGIOGRAM SELECTIVE EACH ADDITIONAL VESSEL  09/05/2022   IR ANGIOGRAM VISCERAL SELECTIVE  02/07/2022   IR ANGIOGRAM VISCERAL SELECTIVE  09/05/2022   IR CV LINE INJECTION  02/24/2023   IR EMBO ART  VEN  HEMORR LYMPH EXTRAV  INC GUIDE ROADMAPPING  09/05/2022   IR EMBO ART  VEN HEMORR LYMPH EXTRAV  INC GUIDE ROADMAPPING  11/05/2022   IR EMBO ARTERIAL NOT HEMORR HEMANG INC GUIDE ROADMAPPING  02/07/2022   IR EMBO TUMOR ORGAN ISCHEMIA INFARCT INC GUIDE ROADMAPPING  01/06/2023   IR IMAGING GUIDED PORT INSERTION  06/13/2022   IR IMAGING GUIDED PORT INSERTION  11/25/2022   IR PARACENTESIS  12/17/2021   IR PARACENTESIS  03/25/2022   IR PARACENTESIS  11/25/2022   IR PARACENTESIS  01/06/2023   IR PARACENTESIS  01/19/2023   IR RADIOLOGIST EVAL & MGMT  03/18/2022   IR RADIOLOGIST EVAL & MGMT  05/07/2022   IR RADIOLOGIST EVAL & MGMT  05/16/2022   IR RADIOLOGIST EVAL & MGMT  10/13/2022   IR RADIOLOGIST EVAL & MGMT  12/12/2022   IR RADIOLOGIST EVAL & MGMT  03/30/2023   IR US GUIDE VASC ACCESS RIGHT  02/07/2022   IR US GUIDE VASC ACCESS RIGHT  09/05/2022   JOINT REPLACEMENT     KNEE SURGERY Right 09/24/2017   plates and pins   PORT-A-CATH REMOVAL N/A 09/23/2022   Procedure: MINOR REMOVAL PORT-A-CATH;  Surgeon: Franky Macho, MD;  Location: [facility];  Service: General;  Laterality: N/A;   TEE WITHOUT CARDIOVERSION N/A 09/23/2022   Procedure: TRANSESOPHAGEAL ECHOCARDIOGRAM (TEE);  Surgeon: Antoine Poche, MD;  Location: [facility];  Service: Endoscopy;  Laterality: N/A;   TOTAL SHOULDER ARTHROPLASTY Right 09/27/2015   Patient Active Problem List   Diagnosis Date Noted   Hepatic encephalopathy (HCC) 03/25/2023   Acute metabolic encephalopathy 03/25/2023   Acute cystitis with hematuria 03/25/2023   Acute hepatic encephalopathy (HCC) 01/19/2023   Pancytopenia (HCC) 01/06/2023   Anemia 01/06/2023   Internal hemorrhoid 01/06/2023   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   Symptomatic anemia 12/17/2022   Hypokalemia 11/06/2022   Acute on chronic anemia 11/03/2022   Hypotension 11/03/2022   AKI (acute kidney injury) (HCC) 10/15/2022   Rectal bleeding 06/17/2022   Obesity (BMI 30-39.9) 06/17/2022   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  mellitus (HCC) 06/17/2022   Grade IV internal hemorrhoids 05/16/2022   Thrombocytopenia (HCC) 03/05/2022   Anemia in chronic kidney disease (CKD) 02/07/2022   GI bleeding 02/04/2022   Chronic kidney disease, stage 3b (HCC) 02/04/2022   Depression with anxiety 02/04/2022   Liver cirrhosis secondary to NASH (HCC)    Insomnia 01/27/2022   Iron deficiency anemia due to chronic blood loss 02/13/2021   History of uterine cancer 02/13/2021   Decompensated hepatic cirrhosis (HCC) 10/25/2020   OSA on CPAP 10/25/2020   Acute respiratory failure with hypoxia (HCC) 09/10/2020   Closed right hip fracture (HCC) 09/02/2020   Type 2 diabetes mellitus with hyperlipidemia (HCC) 09/02/2020   Thalassemia minor    HLD (hyperlipidemia)    Acute kidney injury superimposed on CKD (HCC)    Depression     PCP: Einar Crow MD   REFERRING PROVIDER:  Daniel Angiulli PA-C  REFERRING DIAG: [phone] (ICD-10-CM) - Hepatic encephalopathy  THERAPY DIAG:  Muscle weakness (generalized)  Other abnormalities of gait and mobility  Rationale for Evaluation and Treatment: Rehabilitation  ONSET DATE: 01/06/23  SUBJECTIVE:   SUBJECTIVE STATEMENT: Feels weaker today, stated her hemoglobin was 7.0 then 6.8 last Tuesday.  Has not had any blood transfusions for a while.  Has a headache today.    EVAL: Patient states continued weakness but not as bad as it was last time she was here. Leg swelling was down when she left the hospital and then went back up and is now going down again. She uses the cane around the house. Uses rollator with increased distance. Was weak in the hospital.   PERTINENT HISTORY: Falls, weakness, R knee pain, Cirrhosis, L foot ulcer, CKD, neuropathy PAIN:  Are you having pain? No  PRECAUTIONS: Fall  WEIGHT BEARING RESTRICTIONS: No  FALLS:  Has patient fallen in last 6 months? No  LIVING ENVIRONMENT: Lives with: lives with their spouse Lives in: House/apartment Stairs: No Has  following equipment at home: Single point cane and Environmental consultant - 2 wheeled  OCCUPATION: retired/disability  PLOF: Independent with household mobility with device and Needs assistance with ADLs  PATIENT GOALS: to get stronger   OBJECTIVE:   COGNITION: Overall cognitive status: Within functional limits for tasks assessed     SENSATION: WFL   POSTURE: rounded shoulders and forward head  PALPATION: Bilateral LE edema  LOWER EXTREMITY ROM:  Active ROM Right eval Left eval  Hip flexion    Hip extension    Hip abduction    Hip adduction    Hip internal rotation    Hip external rotation    Knee flexion    Knee extension    Ankle dorsiflexion    Ankle plantarflexion    Ankle inversion    Ankle eversion     (Blank rows = not tested)  LOWER EXTREMITY MMT:  MMT Right eval Left eval Right 04/02/23 Left 04/02/23 Right 04/16/23 Left 04/16/23  Hip flexion 4+ 4+ 5 5    Hip extension     3/5 prone 3+  Hip abduction     4 4+  Hip adduction        Hip internal rotation        Hip external rotation        Knee flexion 4+ 4+ 5 5    Knee extension 4+ 4+ 5 5    Ankle dorsiflexion 5 5 5 5     Ankle plantarflexion        Ankle inversion        Ankle eversion         (Blank rows = not tested)    FUNCTIONAL TESTS:  2 minute walk test: 150 feet with rollator Dynamic Gait Index: 13/24  GAIT: Distance walked: 150 feet Assistive device utilized: Walker - 4 wheeled Level of assistance: Modified independence Comments:  DGI 1. Gait level surface (2) Mild Impairment: Walks 20', uses assistive devices, slower speed, mild gait deviations. 2. Change in gait speed (2) Mild Impairment: Is able to change speed but demonstrates mild gait deviations, or not gait deviations but unable to achieve a significant change in velocity, or uses an assistive device. 3. Gait with horizontal head turns (1) Moderate Impairment: Performs head turns with moderate change in gait velocity, slows  down, staggers but recovers, can continue to walk. 4. Gait with vertical head turns 1) Moderate Impairment: Performs head turns  with moderate change in gait velocity, slows down, staggers but recovers, can continue to walk. 5. Gait and pivot turn (2) Mild Impairment: Pivot turns safely in > 3 seconds and stops with no loss of balance. 6. Step over obstacle (1) Moderate Impairment: Is able to step over box but must stop, then step over. May require verbal cueing. 7. Step around obstacles (2) Mild Impairment: Is able to step around both cones, but must slow down and adjust steps to clear cones. 8. Stairs (1) Moderate Impairment: Two feet to a stair, must use rail.  TOTAL SCORE: 13 / 24   Reassessment 04/02/23 FUNCTIONAL TESTS:  2 minute walk test: 200 feet with SPC Dynamic Gait Index: 13/24  DGI 1. Gait level surface (2) Mild Impairment: Walks 20', uses assistive devices, slower speed, mild gait deviations. 2. Change in gait speed (2) Mild Impairment: Is able to change speed but demonstrates mild gait deviations, or not gait deviations but unable to achieve a significant change in velocity, or uses an assistive device. 3. Gait with horizontal head turns (1) Moderate Impairment: Performs head turns with moderate change in gait velocity, slows down, staggers but recovers, can continue to walk. 4. Gait with vertical head turns 1) Moderate Impairment: Performs head turns with moderate change in gait velocity, slows down, staggers but recovers, can continue to walk. 5. Gait and pivot turn (2) Mild Impairment: Pivot turns safely in > 3 seconds and stops with no loss of balance. 6. Step over obstacle (1) Moderate Impairment: Is able to step over box but must stop, then step over. May require verbal cueing. 7. Step around obstacles (2) Mild Impairment: Is able to step around both cones, but must slow down and adjust steps to clear cones. 8. Stairs (1) Moderate Impairment: Two feet to a stair,  must use rail.  TOTAL SCORE: 13 / 24   TODAY'S TREATMENT:                                                                                                                              DATE:  04/16/23: MMT glutes in prone and sidelying Supine: bridge 2x 10 Sit to stand eccentric control 10x  HHA Standing:  mini squats 10x Sidestep down long hallway  Tandem stance 2x 30" 04/09/23 Standing: Heel raises x 10 with 1 UE assist Toe raises x 10 with 1 UE assist 4" box step ups x 10 each Mini squats x 10 Hip abduction 2 x 10 each Hip extension 2 x 10 each Tandem stance 2 x 30"   04/02/23 Reassessment Seated trunk flexion stretch 10 x 5-10 second holds   03/06/23 EVAL and HEP    PATIENT EDUCATION:  Education details: 04/02/23: HEP, reassessment findings. EVAL Patient educated on exam findings, POC, scope of PT, HEP. Person educated: Patient Education method: Explanation, Demonstration, and Handouts Education comprehension: verbalized understanding, returned demonstration, verbal cues required, and tactile cues required  HOME EXERCISE PROGRAM: Access Code:  [email or web address] URL: [email or web address] 04/16/23: - Supine Bridge  - 1 x daily - 7 x weekly - 3 sets - 10 reps - 5" hold - Standing Tandem Balance with Counter Support  - 1 x daily - 7 x weekly - 2 sets - 3 reps - 30" hold  04/02/23 - Seated Flexion Stretch  - 3 x daily - 7 x weekly - 10 reps - 5-10 second hold  Date: 03/06/2023 - Seated Heel Toe Raises  - 2 x daily - 7 x weekly - 1 sets - 10 reps - Seated Long Arc Quad  - 2 x daily - 7 x weekly - 1 sets - 10 reps - Seated March  - 2 x daily - 7 x weekly - 1 sets - 10 reps - Supine Active Straight Leg Raise  - 7 x weekly - 1 sets - 10 reps - Heel Raises with Counter Support  - 2 x daily - 7 x weekly - 1 sets - 10 reps - 3-5" hold - Mini Squat with Counter Support  - 2 x daily - 7 x weekly - 1 sets - 10 reps - 3-5" hold - Sit to Stand with Counter Support  - 2 x daily  - 7 x weekly - 1 sets - 10 reps  ASSESSMENT:  CLINICAL IMPRESSION: Began session with MMT with noted gluteal max weakness.  Added exercises for gluteal strengthening to POC and HEP with printout given and verbalized understanding.  Cueing required for eccentric control and to stay on task.  Encouraged pt to complete balance based exercises near sink or counter for safety.    OBJECTIVE IMPAIRMENTS: Abnormal gait, decreased activity tolerance, decreased balance, decreased endurance, decreased mobility, difficulty walking, decreased strength, and improper body mechanics.   ACTIVITY LIMITATIONS: carrying, lifting, bending, standing, squatting, stairs, transfers, bed mobility, locomotion level, and caring for others  PARTICIPATION LIMITATIONS: meal prep, cleaning, laundry, shopping, community activity, and yard work  PERSONAL FACTORS: Time since onset of injury/illness/exacerbation and 3+ comorbidities: Falls, weakness, R knee pain, Cirrhosis, L foot ulcer  are also affecting patient's functional outcome.   REHAB POTENTIAL: Good  CLINICAL DECISION MAKING: Stable/uncomplicated  EVALUATION COMPLEXITY: Low   GOALS: Goals reviewed with patient? Yes  SHORT TERM GOALS: Target date: 04/03/2023    Patient will be independent with HEP in order to improve functional outcomes. Baseline: Goal status: MET  2.  Patient will report at least 25% improvement in symptoms for improved quality of life. Baseline: Goal status: MET   LONG TERM GOALS: Target date: 05/01/2023    Patient will report at least 75% improvement in symptoms for improved quality of life. Baseline:  Goal status: INITIAL  2.  Patient will improve DGI score by at least 7 points in order to indicate improved balance for community ambulation. Baseline: 13/24 Goal status: INITIAL  3.  Patient will demonstrate grade of 5/5 MMT grade in all tested musculature as evidence of improved strength to assist with stair ambulation and  gait.   Baseline: see above Goal status: MET  4.  Patient will be able to ambulate at least 226 feet in with LRAD in order to demonstrate improved tolerance to activity. Baseline: 150 with rollator 04/02/23: 200 with SPC Goal status: INITIAL     PLAN:  PT FREQUENCY: 1-2x/week  PT DURATION: 8 weeks  PLANNED INTERVENTIONS: Therapeutic exercises, Therapeutic activity, Neuromuscular re-education, Balance training, Gait training, Patient/Family education, Joint manipulation, Joint mobilization, Stair training, Orthotic/Fit training, DME instructions, Aquatic Therapy, Dry Needling,  Electrical stimulation, Spinal manipulation, Spinal mobilization, Cryotherapy, Moist heat, Compression bandaging, scar mobilization, Splintting, Taping, Traction, Ultrasound, Ionotophoresis 4mg /ml Dexamethasone, and Manual therapy  PLAN FOR NEXT SESSION: functional strength, endurance, balance training   Becky Sax, LPTA/CLT; CBIS [phone]  Juel Burrow, PTA 04/16/2023, 3:51 PM 3:51 PM, 04/16/23

## 2023-04-16 NOTE — Therapy (Signed)
OUTPATIENT OCCUPATIONAL THERAPY ORTHO EVALUATION  Patient Name: Rachel Huber MRN: [id number] DOB:25-Oct-1955, 67 y.o., female Today's Date: 04/17/2023  PCP: Einar Crow, MD REFERRING PROVIDER: Charlton Amor, PA-C  END OF SESSION:  OT End of Session - 04/17/23 5784     Visit Number 1    Number of Visits 7    Date for OT Re-Evaluation 06/05/23    Authorization Type Medicare Part A and B    OT Start Time 1310    OT Stop Time 1352    OT Time Calculation (min) 42 min    Activity Tolerance Patient tolerated treatment well    Behavior During Therapy WFL for tasks assessed/performed             Past Medical History:  Diagnosis Date   Anemia in chronic kidney disease (CKD) 02/07/2022   Anxiety    Arthritis    Ascites of liver    Weekly paracentesis   Cirrhosis of liver (HCC)    CKD (chronic kidney disease) stage 4, GFR 15-29 ml/min (HCC)    GERD (gastroesophageal reflux disease)    Grade IV internal hemorrhoids    Hypertension    NASH (nonalcoholic steatohepatitis)    Neuropathy, diabetic (HCC)    Obesity (BMI 30-39.9)    OSA on CPAP    Pneumonia    Portal hypertension (HCC)    S/P TIPS (transjugular intrahepatic portosystemic shunt)    Thalassemia minor 1992   Past Surgical History:  Procedure Laterality Date   ABDOMINAL HYSTERECTOMY     BREAST BIOPSY Right 02/15/2018   [place] bx 6-6:30 ribbon shape, ONE CORE FRAGMENT WITH FIBROSIS. ONE CORE FRAGMENT WITH PORTION OF A DILATED   BREAST BIOPSY Right 02/15/2018   [place] bx 9:00 heart shape, USUAL DUCTAL HYPERPLASIA   BREAST LUMPECTOMY Right 03/09/2018   Procedure: BREAST LUMPECTOMY x 2;  Surgeon: Sung Amabile, DO;  Location: [facility] ORS;  Service: General;  Laterality: Right;   CATARACT EXTRACTION W/PHACO Left 06/11/2016   Procedure: CATARACT EXTRACTION PHACO AND INTRAOCULAR LENS PLACEMENT (IOC);  Surgeon: Sallee Lange, MD;  Location: [facility];  Service: Ophthalmology;  Laterality: Left;  Lot #  [id number] H US:01:38.6 AP%:26.4 CDE:44.15   CATARACT EXTRACTION W/PHACO Right 06/15/2018   Procedure: CATARACT EXTRACTION PHACO AND INTRAOCULAR LENS PLACEMENT (IOC);  Surgeon: Galen Manila, MD;  Location: [facility];  Service: Ophthalmology;  Laterality: Right;  [place] 00:38.2 CDE 4.23 Fluid Pack Lot # [id number]   COLONOSCOPIES     COLONOSCOPY WITH PROPOFOL N/A 10/10/2020   Procedure: COLONOSCOPY WITH PROPOFOL;  Surgeon: Regis Bill, MD;  Location: [facility] ENDOSCOPY;  Service: Endoscopy;  Laterality: N/A;   COLONOSCOPY WITH PROPOFOL N/A 11/20/2020   Procedure: COLONOSCOPY WITH PROPOFOL;  Surgeon: Regis Bill, MD;  Location: [facility] ENDOSCOPY;  Service: Endoscopy;  Laterality: N/A;  DM STAT CBC, BMP COVID POSITIVE 09/02/2020   COLONOSCOPY WITH PROPOFOL N/A 06/19/2022   Procedure: COLONOSCOPY WITH PROPOFOL;  Surgeon: Regis Bill, MD;  Location: [facility] ENDOSCOPY;  Service: Endoscopy;  Laterality: N/A;   CTR     ESOPHAGOGASTRODUODENOSCOPY (EGD) WITH PROPOFOL N/A 10/10/2020   Procedure: ESOPHAGOGASTRODUODENOSCOPY (EGD) WITH PROPOFOL;  Surgeon: Regis Bill, MD;  Location: [facility] ENDOSCOPY;  Service: Endoscopy;  Laterality: N/A;  COVID POSITIVE 10/08/2020   FLEXIBLE SIGMOIDOSCOPY N/A 02/06/2022   Procedure: FLEXIBLE SIGMOIDOSCOPY;  Surgeon: Regis Bill, MD;  Location: [facility] ENDOSCOPY;  Service: Endoscopy;  Laterality: N/A;  Patient requests anesthesia   FLEXIBLE SIGMOIDOSCOPY N/A 12/19/2022   Procedure: FLEXIBLE SIGMOIDOSCOPY;  Surgeon: Marguerita Merles, Reuel Boom, MD;  Location: [facility];  Service: Gastroenterology;  Laterality: N/A;   IR ANGIOGRAM SELECTIVE EACH ADDITIONAL VESSEL  02/07/2022   IR ANGIOGRAM SELECTIVE EACH ADDITIONAL VESSEL  02/07/2022   IR ANGIOGRAM SELECTIVE EACH ADDITIONAL VESSEL  09/05/2022   IR ANGIOGRAM VISCERAL SELECTIVE  02/07/2022   IR ANGIOGRAM VISCERAL SELECTIVE  09/05/2022   IR CV LINE INJECTION  02/24/2023   IR EMBO ART  VEN HEMORR LYMPH EXTRAV  INC  GUIDE ROADMAPPING  09/05/2022   IR EMBO ART  VEN HEMORR LYMPH EXTRAV  INC GUIDE ROADMAPPING  11/05/2022   IR EMBO ARTERIAL NOT HEMORR HEMANG INC GUIDE ROADMAPPING  02/07/2022   IR EMBO TUMOR ORGAN ISCHEMIA INFARCT INC GUIDE ROADMAPPING  01/06/2023   IR IMAGING GUIDED PORT INSERTION  06/13/2022   IR IMAGING GUIDED PORT INSERTION  11/25/2022   IR PARACENTESIS  12/17/2021   IR PARACENTESIS  03/25/2022   IR PARACENTESIS  11/25/2022   IR PARACENTESIS  01/06/2023   IR PARACENTESIS  01/19/2023   IR RADIOLOGIST EVAL & MGMT  03/18/2022   IR RADIOLOGIST EVAL & MGMT  05/07/2022   IR RADIOLOGIST EVAL & MGMT  05/16/2022   IR RADIOLOGIST EVAL & MGMT  10/13/2022   IR RADIOLOGIST EVAL & MGMT  12/12/2022   IR RADIOLOGIST EVAL & MGMT  03/30/2023   IR US GUIDE VASC ACCESS RIGHT  02/07/2022   IR US GUIDE VASC ACCESS RIGHT  09/05/2022   JOINT REPLACEMENT     KNEE SURGERY Right 09/24/2017   plates and pins   PORT-A-CATH REMOVAL N/A 09/23/2022   Procedure: MINOR REMOVAL PORT-A-CATH;  Surgeon: Franky Macho, MD;  Location: [facility];  Service: General;  Laterality: N/A;   TEE WITHOUT CARDIOVERSION N/A 09/23/2022   Procedure: TRANSESOPHAGEAL ECHOCARDIOGRAM (TEE);  Surgeon: Antoine Poche, MD;  Location: [facility];  Service: Endoscopy;  Laterality: N/A;   TOTAL SHOULDER ARTHROPLASTY Right 09/27/2015   Patient Active Problem List   Diagnosis Date Noted   Hepatic encephalopathy (HCC) 03/25/2023   Acute metabolic encephalopathy 03/25/2023   Acute cystitis with hematuria 03/25/2023   Acute hepatic encephalopathy (HCC) 01/19/2023   Pancytopenia (HCC) 01/06/2023   Anemia 01/06/2023   Internal hemorrhoid 01/06/2023   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   Symptomatic anemia 12/17/2022   Hypokalemia 11/06/2022   Acute on chronic anemia 11/03/2022   Hypotension 11/03/2022   AKI (acute kidney injury) (HCC) 10/15/2022   Rectal bleeding 06/17/2022   Obesity (BMI 30-39.9) 06/17/2022   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 mellitus (HCC)  06/17/2022   Grade IV internal hemorrhoids 05/16/2022   Thrombocytopenia (HCC) 03/05/2022   Anemia in chronic kidney disease (CKD) 02/07/2022   GI bleeding 02/04/2022   Chronic kidney disease, stage 3b (HCC) 02/04/2022   Depression with anxiety 02/04/2022   Liver cirrhosis secondary to NASH (HCC)    Insomnia 01/27/2022   Iron deficiency anemia due to chronic blood loss 02/13/2021   History of uterine cancer 02/13/2021   Decompensated hepatic cirrhosis (HCC) 10/25/2020   OSA on CPAP 10/25/2020   Acute respiratory failure with hypoxia (HCC) 09/10/2020   Closed right hip fracture (HCC) 09/02/2020   Type 2 diabetes mellitus with hyperlipidemia (HCC) 09/02/2020   Thalassemia minor    HLD (hyperlipidemia)    Acute kidney injury superimposed on CKD (HCC)    Depression     ONSET DATE: 01/06/23  REFERRING DIAG: Hepatic Encephalopathy w/ R shoulder pain  THERAPY DIAG:  Other lack of coordination  Other symptoms and signs involving the nervous system  Rationale for Evaluation and Treatment: Rehabilitation  SUBJECTIVE:   SUBJECTIVE STATEMENT: "I'm doing better I think" Pt accompanied by: self  PERTINENT HISTORY: PMH significant for diabetes mellitus hypertension [name] status post TIPS revision at [facility] weekly paracentesis recurrent grade 4 internal hemorrhoids status post embolization of superior rectal artery 11/05/2022, multifactorial anemia, OSA with CPAP, CKD stage III, thrombocytopenia/leukopenia due to splenic sequestration/cirrhosis, beta thalassemia intermedia per hematology/oncology, hyperlipidemia, chronic orthostasis with ProAmatine.   PRECAUTIONS: None  WEIGHT BEARING RESTRICTIONS: No  PAIN:  Are you having pain? Yes: NPRS scale: 6/10 Pain location: R shoulder and elbow Pain description: Aching and constant Aggravating factors: movement Relieving factors: rest  FALLS: Has patient fallen in last 6 months? Yes. Number of falls 1  LIVING  ENVIRONMENT: Lives with: lives with their spouse Lives in: House/apartment Has following equipment at home: Counselling psychologist, Environmental consultant - 4 wheeled, and Grab bars  PLOF: Independent with household mobility with device and Requires assistive device for independence  PATIENT GOALS: Reduce shoulder pain and improve independence   OBJECTIVE:   HAND DOMINANCE: Right  ADLs: Overall ADLs: Due to weakness and fatigue pt requires assist with all IADL's and compensatory strategies for BADL's   FUNCTIONAL OUTCOME MEASURES: Upper Extremity Functional Scale (UEFS): 41.3  UPPER EXTREMITY ROM:     Active ROM Right eval  Shoulder flexion 125  Shoulder abduction 104  Shoulder internal rotation 90  Shoulder external rotation 16  (Blank rows = not tested)   UPPER EXTREMITY MMT:     MMT Right eval Left eval  Shoulder flexion 4-/5 4+/5  Shoulder abduction 4/5 4+/5  Shoulder internal rotation 4-/5 4+/5  Shoulder external rotation 3+/5 4/5  Elbow flexion 5/5 5/5  Elbow extension 5/5 5/5  (Blank rows = not tested)  HAND FUNCTION: Grip strength: Right: 45 lbs; Left: 29 lbs, Lateral pinch: Right: 11 lbs, Left: 10 lbs, and 3 point pinch: Right: 8 lbs, Left: 8 lbs   COORDINATION: 9 Hole Peg test: Right: 37.31 sec; Left: 47.45 sec  OBSERVATIONS: severe fascial restrictions along the biceps, deltoid, and scapular region.   TODAY'S TREATMENT:                                                                                                                              DATE: 04/16/23: Evaluation Only    PATIENT EDUCATION: Education details: A/ROM Person educated: Patient Education method: Programmer, multimedia, Facilities manager, and Handouts Education comprehension: verbalized understanding and returned demonstration  HOME EXERCISE PROGRAM: 8/29: A/ROM  GOALS: Goals reviewed with patient? Yes  SHORT TERM GOALS: Target date: 06/05/23  Pt will be provided and educated on HEP for BUE mobility and  strength for ADL completion.   Goal status: IN PROGRESS   2.  Pt will decrease pain in BUE to 3/10 or less in order to sleep for 3+ consecutive hours without waking due to pain.  Goal status: IN PROGRESS   3.  Pt will increased BUE strength to 4+/5 in order to lift items to complete cooking and cleaning tasks.    Goal status: IN PROGRESS   4.  Pt will increase BUE grip strength by 10# and pinch strength by 3#.   Goal status: IN PROGRESS   5.  Pt will improve BUE coordination to 40" or less in order to manipulate small items during dressing and bathing tasks with no compensatory strategies or AE.    Goal status: IN PROGRESS  ASSESSMENT:  CLINICAL IMPRESSION: Patient is a 67 y.o. female who was seen today for occupational therapy evaluation for s/p hepatic encephalopathy with increased shoulder pain and weakness. Pt presents with increased difficulty completing ADL's and IADL's due to pain, weakness, decreased coordination, and limited ROM.   PERFORMANCE DEFICITS: in functional skills including ADLs, IADLs, sensation, ROM, strength, pain, fascial restrictions, Fine motor control, Gross motor control, body mechanics, decreased knowledge of use of DME, and UE functional use.  IMPAIRMENTS: are limiting patient from ADLs, IADLs, rest and sleep, work, leisure, and social participation.   COMORBIDITIES: has no other co-morbidities that affects occupational performance. Patient will benefit from skilled OT to address above impairments and improve overall function.  MODIFICATION OR ASSISTANCE TO COMPLETE EVALUATION: No modification of tasks or assist necessary to complete an evaluation.  OT OCCUPATIONAL PROFILE AND HISTORY: Problem focused assessment: Including review of records relating to presenting problem.  CLINICAL DECISION MAKING: LOW - limited treatment options, no task modification necessary  REHAB POTENTIAL: Good  EVALUATION COMPLEXITY: Low      PLAN:  OT FREQUENCY:  1-2x/week  OT DURATION: 6 weeks  PLANNED INTERVENTIONS: self care/ADL training, therapeutic exercise, therapeutic activity, neuromuscular re-education, manual therapy, passive range of motion, functional mobility training, electrical stimulation, ultrasound, moist heat, patient/family education, energy conservation, coping strategies training, DME and/or AE instructions, and Re-evaluation  RECOMMENDED OTHER SERVICES: N/A  CONSULTED AND AGREED WITH PLAN OF CARE: Patient  PLAN FOR NEXT SESSION: Manual Therapy, AA/ROM, A/ROM, Strengthening   Khayla Koppenhaver Bing Plume, OTR/L [facility] [phone] Milta Croson Rosemarie Beath, OT 04/17/2023, 8:09 AM

## 2023-04-21 ENCOUNTER — Ambulatory Visit: Payer: Medicare Other

## 2023-04-22 ENCOUNTER — Inpatient Hospital Stay: Payer: Medicare Other | Attending: Hematology

## 2023-04-22 ENCOUNTER — Inpatient Hospital Stay: Payer: Medicare Other

## 2023-04-22 VITALS — BP 110/48 | HR 67 | Temp 97.9°F | Resp 18

## 2023-04-22 DIAGNOSIS — D631 Anemia in chronic kidney disease: Secondary | ICD-10-CM | POA: Diagnosis not present

## 2023-04-22 DIAGNOSIS — E559 Vitamin D deficiency, unspecified: Secondary | ICD-10-CM | POA: Insufficient documentation

## 2023-04-22 DIAGNOSIS — N184 Chronic kidney disease, stage 4 (severe): Secondary | ICD-10-CM | POA: Insufficient documentation

## 2023-04-22 DIAGNOSIS — D561 Beta thalassemia: Secondary | ICD-10-CM | POA: Insufficient documentation

## 2023-04-22 DIAGNOSIS — D500 Iron deficiency anemia secondary to blood loss (chronic): Secondary | ICD-10-CM | POA: Diagnosis present

## 2023-04-22 DIAGNOSIS — E538 Deficiency of other specified B group vitamins: Secondary | ICD-10-CM | POA: Insufficient documentation

## 2023-04-22 LAB — COMPREHENSIVE METABOLIC PANEL
ALT: 22 U/L (ref 0–44)
AST: 42 U/L — ABNORMAL HIGH (ref 15–41)
Albumin: 2.8 g/dL — ABNORMAL LOW (ref 3.5–5.0)
Alkaline Phosphatase: 127 U/L — ABNORMAL HIGH (ref 38–126)
Anion gap: 8 (ref 5–15)
BUN: 42 mg/dL — ABNORMAL HIGH (ref 8–23)
CO2: 23 mmol/L (ref 22–32)
Calcium: 8.4 mg/dL — ABNORMAL LOW (ref 8.9–10.3)
Chloride: 101 mmol/L (ref 98–111)
Creatinine, Ser: 1.6 mg/dL — ABNORMAL HIGH (ref 0.44–1.00)
GFR, Estimated: 36 mL/min — ABNORMAL LOW (ref >60)
Glucose, Bld: 222 mg/dL — ABNORMAL HIGH (ref 70–99)
Potassium: 3.6 mmol/L (ref 3.5–5.1)
Sodium: 132 mmol/L — ABNORMAL LOW (ref 135–145)
Total Bilirubin: 1.9 mg/dL — ABNORMAL HIGH (ref 0.3–1.2)
Total Protein: 6.1 g/dL — ABNORMAL LOW (ref 6.5–8.1)

## 2023-04-22 LAB — CBC
HCT: 21.1 % — ABNORMAL LOW (ref 36.0–46.0)
Hemoglobin: 6.6 g/dL — CL (ref 12.0–15.0)
MCH: 22.2 pg — ABNORMAL LOW (ref 26.0–34.0)
MCHC: 31.3 g/dL (ref 30.0–36.0)
MCV: 71 fL — ABNORMAL LOW (ref 80.0–100.0)
Platelets: 207 10*3/uL (ref 150–400)
RBC: 2.97 MIL/uL — ABNORMAL LOW (ref 3.87–5.11)
RDW: 21.4 % — ABNORMAL HIGH (ref 11.5–15.5)
WBC: 4.6 10*3/uL (ref 4.0–10.5)
nRBC: 0 % (ref 0.0–0.2)

## 2023-04-22 LAB — SAMPLE TO BLOOD BANK

## 2023-04-22 LAB — PREPARE RBC (CROSSMATCH)

## 2023-04-22 MED ORDER — HEPARIN SOD (PORK) LOCK FLUSH 100 UNIT/ML IV SOLN
500.0000 [IU] | Freq: Once | INTRAVENOUS | Status: AC
  Administered 2023-04-22: 500 [IU] via INTRAVENOUS

## 2023-04-22 MED ORDER — SODIUM CHLORIDE 0.9% FLUSH
10.0000 mL | Freq: Once | INTRAVENOUS | Status: AC
  Administered 2023-04-22: 10 mL via INTRAVENOUS

## 2023-04-22 NOTE — Progress Notes (Signed)
2 units of blood ordered per Reb. Pennington PA-C. Will make patient aware. Hgb 6.6

## 2023-04-22 NOTE — Progress Notes (Signed)
Patients port flushed without difficulty.  Good blood return noted with no bruising or swelling noted at site.  Band aid applied.  VSS with discharge and left in satisfactory condition with no s/s of distress noted.   

## 2023-04-22 NOTE — Progress Notes (Signed)
CRITICAL VALUE STICKER  CRITICAL VALUE: Hgb 6.6  RECEIVER (on-site recipient of call):Lawson Fiscal RN  DATE & TIME NOTIFIED: [phone] 04/22/23  MESSENGER (representative from lab): Kirsten  MD NOTIFIED: Rojelio Brenner PA  TIME OF NOTIFICATION:1442 04/22/23  RESPONSE:  2 UPRBC ordered.

## 2023-04-23 ENCOUNTER — Inpatient Hospital Stay: Payer: Medicare Other

## 2023-04-23 ENCOUNTER — Ambulatory Visit (HOSPITAL_COMMUNITY): Payer: Medicare Other | Admitting: Occupational Therapy

## 2023-04-23 ENCOUNTER — Ambulatory Visit (HOSPITAL_COMMUNITY): Payer: Medicare Other | Admitting: Physical Therapy

## 2023-04-23 DIAGNOSIS — D500 Iron deficiency anemia secondary to blood loss (chronic): Secondary | ICD-10-CM

## 2023-04-23 MED ORDER — SODIUM CHLORIDE 0.9% FLUSH
10.0000 mL | INTRAVENOUS | Status: AC | PRN
  Administered 2023-04-23: 10 mL

## 2023-04-23 MED ORDER — SODIUM CHLORIDE 0.9% IV SOLUTION
250.0000 mL | Freq: Once | INTRAVENOUS | Status: AC
  Administered 2023-04-23: 250 mL via INTRAVENOUS

## 2023-04-23 MED ORDER — HEPARIN SOD (PORK) LOCK FLUSH 100 UNIT/ML IV SOLN
500.0000 [IU] | Freq: Every day | INTRAVENOUS | Status: AC | PRN
  Administered 2023-04-23: 500 [IU]

## 2023-04-23 NOTE — Patient Instructions (Signed)
[facility]  Discharge Instructions: Thank you for choosing [facility] to provide your oncology and hematology care.  If you have a lab appointment with the Cancer Center - please note that after April 8th, 2024, all labs will be drawn in the cancer center.  You do not have to check in or register with the main entrance as you have in the past but will complete your check-in in the cancer center.  Wear comfortable clothing and clothing appropriate for easy access to any Portacath or PICC line.   We strive to give you quality time with your provider. You may need to reschedule your appointment if you arrive late (15 or more minutes).  Arriving late affects you and other patients whose appointments are after yours.  Also, if you miss three or more appointments without notifying the office, you may be dismissed from the clinic at the provider's discretion.      For prescription refill requests, have your pharmacy contact our office and allow 72 hours for refills to be completed.    Today you received the following chemotherapy and/or immunotherapy agents 2UPRBC      To help prevent nausea and vomiting after your treatment, we encourage you to take your nausea medication as directed.  BELOW ARE SYMPTOMS THAT SHOULD BE REPORTED IMMEDIATELY: *FEVER GREATER THAN 100.4 F (38 C) OR HIGHER *CHILLS OR SWEATING *NAUSEA AND VOMITING THAT IS NOT CONTROLLED WITH YOUR NAUSEA MEDICATION *UNUSUAL SHORTNESS OF BREATH *UNUSUAL BRUISING OR BLEEDING *URINARY PROBLEMS (pain or burning when urinating, or frequent urination) *BOWEL PROBLEMS (unusual diarrhea, constipation, pain near the anus) TENDERNESS IN MOUTH AND THROAT WITH OR WITHOUT PRESENCE OF ULCERS (sore throat, sores in mouth, or a toothache) UNUSUAL RASH, SWELLING OR PAIN  UNUSUAL VAGINAL DISCHARGE OR ITCHING   Items with * indicate a potential emergency and should be followed up as soon as possible or go to the  Emergency Department if any problems should occur.  Please show the CHEMOTHERAPY ALERT CARD or IMMUNOTHERAPY ALERT CARD at check-in to the Emergency Department and triage nurse.  Should you have questions after your visit or need to cancel or reschedule your appointment, please contact [facility] [phone]  and follow the prompts.  Office hours are 8:00 a.m. to 4:30 p.m. Monday - Friday. Please note that voicemails left after 4:00 p.m. may not be returned until the following business day.  We are closed weekends and major holidays. You have access to a nurse at all times for urgent questions. Please call the main number to the clinic [phone] and follow the prompts.  For any non-urgent questions, you may also contact your provider using MyChart. We now offer e-Visits for anyone 63 and older to request care online for non-urgent symptoms. For details visit [email or web address].   Also download the MyChart app! Go to the app store, search "MyChart", open the app, select [facility], and log in with your MyChart username and password.

## 2023-04-23 NOTE — Progress Notes (Signed)
Patient presents today for [facility] per providers order.  Vital signs WNL.  Patient states that she took premedications at home.  Stable during transfusion without adverse affects.  Vital signs stable.  No complaints at this time.  Discharge from clinic ambulatory in stable condition.  Alert and oriented X 3.  Follow up with [facility] as scheduled.

## 2023-04-24 LAB — BPAM RBC
Blood Product Expiration Date: 202409292359
Blood Product Expiration Date: 202409292359
ISSUE DATE / TIME: 202409050935
ISSUE DATE / TIME: 202409051129
Unit Type and Rh: 6200
Unit Type and Rh: 6200

## 2023-04-24 LAB — TYPE AND SCREEN
ABO/RH(D): A POS
Antibody Screen: NEGATIVE
Unit division: 0
Unit division: 0

## 2023-04-27 ENCOUNTER — Encounter: Payer: Self-pay | Admitting: Physical Medicine and Rehabilitation

## 2023-04-27 ENCOUNTER — Inpatient Hospital Stay: Payer: Medicare Other

## 2023-04-27 ENCOUNTER — Encounter
Payer: Medicare Other | Attending: Physical Medicine and Rehabilitation | Admitting: Physical Medicine and Rehabilitation

## 2023-04-27 VITALS — BP 111/70 | HR 104 | Ht 71.0 in | Wt 231.6 lb

## 2023-04-27 DIAGNOSIS — R5383 Other fatigue: Secondary | ICD-10-CM | POA: Insufficient documentation

## 2023-04-27 DIAGNOSIS — G6289 Other specified polyneuropathies: Secondary | ICD-10-CM | POA: Insufficient documentation

## 2023-04-27 DIAGNOSIS — K7682 Hepatic encephalopathy: Secondary | ICD-10-CM | POA: Diagnosis not present

## 2023-04-27 MED ORDER — CAPSAICIN-CLEANSING GEL 8 % EX KIT
4.0000 | PACK | Freq: Once | CUTANEOUS | Status: AC
Start: 2023-04-27 — End: 2023-04-28
  Administered 2023-04-28: 4 via TOPICAL

## 2023-04-27 NOTE — Progress Notes (Signed)
Subjective:    Patient ID: Rachel Huber, female    DOB: 11/11/1955, 67 y.o.   MRN: [id number]  HPI Rachel Huber is a 67 year old woman who presents for hospital follow-up for debility  1) Liver cirrhosis -there has been no output in the last 2 paracentesis -eating normally  2) Anemia: -has not needed transfusions whereas before she had been needing them weekly.   3) Impaired ambulation: -not walking as well as her legs are beginning to swell again -starting PT and OT  4) Neuropathy -she is ready to try the Qutenza patches today -she takes gabapentin  5) Fatigue: She feels fatigued during the day  Pain Inventory Average Pain 3 Pain Right Now 1 My pain is intermittent, sharp, and aching  LOCATION OF PAIN  Right Shoulder  BOWEL -Number of stools per week: 7-12 Oral laxative use Yes  Type of laxative Latulose   BLADDER Pads  Bladder incontinence Yes  Frequent urination Yes  Leakage with coughing No  Difficulty starting stream Yes  Incomplete bladder emptying No    Mobility walk with assistance use a cane use a walker ability to climb steps?  yes do you drive?  no use a wheelchair  Function disabled: date disabled 02/2021 I need assistance with the following:  bathing, meal prep, household duties, and shopping Do you have any goals in this area?  yes  Neuro/Psych bladder control problems weakness tingling trouble walking anxiety  Prior Studies Any changes since last visit?  no  Physicians involved in your care Any changes since last visit?  no   Family History  Problem Relation Age of Onset   Breast cancer Mother 70   Lymphoma Mother    Diabetes Father    Kidney cancer Father    Heart disease Father    Diabetes Sister    Breast cancer Sister 62   Social History   Socioeconomic History   Marital status: Married    Spouse name: Not on file   Number of children: 0   Years of education: Not on file   Highest education level: Not on  file  Occupational History   Occupation: EMPLOYED  Tobacco Use   Smoking status: Never   Smokeless tobacco: Never  Vaping Use   Vaping status: Never Used  Substance and Sexual Activity   Alcohol use: No   Drug use: Never   Sexual activity: Not Currently  Other Topics Concern   Not on file  Social History Narrative   Not on file   Social Determinants of Health   Financial Resource Strain: Low Risk  (04/04/2022)   Overall Financial Resource Strain (CARDIA)    Difficulty of Paying Living Expenses: Not hard at all  Food Insecurity: Patient Declined (03/25/2023)   Hunger Vital Sign    Worried About Running Out of Food in the Last Year: Patient declined    Ran Out of Food in the Last Year: Patient declined  Transportation Needs: Patient Declined (03/25/2023)   PRAPARE - Administrator, Civil Service (Medical): Patient declined    Lack of Transportation (Non-Medical): Patient declined  Physical Activity: Inactive (04/04/2022)   Exercise Vital Sign    Days of Exercise per Week: 0 days    Minutes of Exercise per Session: 0 min  Stress: No Stress Concern Present (04/04/2022)   [employer] of Occupational Health - Occupational Stress Questionnaire    Feeling of Stress : Only a little  Social Connections: Moderately Isolated (04/04/2022)  Social Connection and Isolation Panel [NHANES]    Frequency of Communication with Friends and Family: More than three times a week    Frequency of Social Gatherings with Friends and Family: Three times a week    Attends Religious Services: Never    Active Member of Clubs or Organizations: No    Attends Banker Meetings: Never    Marital Status: Married   Past Surgical History:  Procedure Laterality Date   ABDOMINAL HYSTERECTOMY     BREAST BIOPSY Right 02/15/2018   [place] bx 6-6:30 ribbon shape, ONE CORE FRAGMENT WITH FIBROSIS. ONE CORE FRAGMENT WITH PORTION OF A DILATED   BREAST BIOPSY Right 02/15/2018   [place] bx 9:00 heart  shape, USUAL DUCTAL HYPERPLASIA   BREAST LUMPECTOMY Right 03/09/2018   Procedure: BREAST LUMPECTOMY x 2;  Surgeon: Sung Amabile, DO;  Location: [facility] OR[facility];  Service: General;  Laterality: Right;   CATARACT EXTRACTION W/PHACO Left 06/11/2016   Procedure: CATARACT EXTRACTION PHACO AND INTRAOCULAR LENS PLACEMENT (IOC);  Surgeon: Sallee Lange, MD;  Location: [facility];  Service: Ophthalmology;  Laterality: Left;  Lot # [id number] H US:01:38.6 AP%:26.4 CDE:44.15   CATARACT EXTRACTION W/PHACO Right 06/15/2018   Procedure: CATARACT EXTRACTION PHACO AND INTRAOCULAR LENS PLACEMENT (IOC);  Surgeon: Galen Manila, MD;  Location: [facility];  Service: Ophthalmology;  Laterality: Right;  [place] 00:38.2 CDE 4.23 Fluid Pack Lot # [id number]   COLONOSCOPIES     COLONOSCOPY WITH PROPOFOL N/A 10/10/2020   Procedure: COLONOSCOPY WITH PROPOFOL;  Surgeon: Regis Bill, MD;  Location: [facility] ENDOSCOPY;  Service: Endoscopy;  Laterality: N/A;   COLONOSCOPY WITH PROPOFOL N/A 11/20/2020   Procedure: COLONOSCOPY WITH PROPOFOL;  Surgeon: Regis Bill, MD;  Location: [facility] ENDOSCOPY;  Service: Endoscopy;  Laterality: N/A;  DM STAT CBC, BMP COVID POSITIVE 09/02/2020   COLONOSCOPY WITH PROPOFOL N/A 06/19/2022   Procedure: COLONOSCOPY WITH PROPOFOL;  Surgeon: Regis Bill, MD;  Location: [facility] ENDOSCOPY;  Service: Endoscopy;  Laterality: N/A;   CTR     ESOPHAGOGASTRODUODENOSCOPY (EGD) WITH PROPOFOL N/A 10/10/2020   Procedure: ESOPHAGOGASTRODUODENOSCOPY (EGD) WITH PROPOFOL;  Surgeon: Regis Bill, MD;  Location: [facility] ENDOSCOPY;  Service: Endoscopy;  Laterality: N/A;  COVID POSITIVE 10/08/2020   FLEXIBLE SIGMOIDOSCOPY N/A 02/06/2022   Procedure: FLEXIBLE SIGMOIDOSCOPY;  Surgeon: Regis Bill, MD;  Location: [facility] ENDOSCOPY;  Service: Endoscopy;  Laterality: N/A;  Patient requests anesthesia   FLEXIBLE SIGMOIDOSCOPY N/A 12/19/2022   Procedure: FLEXIBLE SIGMOIDOSCOPY;  Surgeon: Dolores Frame, MD;   Location: [facility];  Service: Gastroenterology;  Laterality: N/A;   IR ANGIOGRAM SELECTIVE EACH ADDITIONAL VESSEL  02/07/2022   IR ANGIOGRAM SELECTIVE EACH ADDITIONAL VESSEL  02/07/2022   IR ANGIOGRAM SELECTIVE EACH ADDITIONAL VESSEL  09/05/2022   IR ANGIOGRAM VISCERAL SELECTIVE  02/07/2022   IR ANGIOGRAM VISCERAL SELECTIVE  09/05/2022   IR CV LINE INJECTION  02/24/2023   IR EMBO ART  VEN HEMORR LYMPH EXTRAV  INC GUIDE ROADMAPPING  09/05/2022   IR EMBO ART  VEN HEMORR LYMPH EXTRAV  INC GUIDE ROADMAPPING  11/05/2022   IR EMBO ARTERIAL NOT HEMORR HEMANG INC GUIDE ROADMAPPING  02/07/2022   IR EMBO TUMOR ORGAN ISCHEMIA INFARCT INC GUIDE ROADMAPPING  01/06/2023   IR IMAGING GUIDED PORT INSERTION  06/13/2022   IR IMAGING GUIDED PORT INSERTION  11/25/2022   IR PARACENTESIS  12/17/2021   IR PARACENTESIS  03/25/2022   IR PARACENTESIS  11/25/2022   IR PARACENTESIS  01/06/2023   IR PARACENTESIS  01/19/2023  IR RADIOLOGIST EVAL & MGMT  03/18/2022   IR RADIOLOGIST EVAL & MGMT  05/07/2022   IR RADIOLOGIST EVAL & MGMT  05/16/2022   IR RADIOLOGIST EVAL & MGMT  10/13/2022   IR RADIOLOGIST EVAL & MGMT  12/12/2022   IR RADIOLOGIST EVAL & MGMT  03/30/2023   IR US GUIDE VASC ACCESS RIGHT  02/07/2022   IR US GUIDE VASC ACCESS RIGHT  09/05/2022   JOINT REPLACEMENT     KNEE SURGERY Right 09/24/2017   plates and pins   PORT-A-CATH REMOVAL N/A 09/23/2022   Procedure: MINOR REMOVAL PORT-A-CATH;  Surgeon: Franky Macho, MD;  Location: [facility];  Service: General;  Laterality: N/A;   TEE WITHOUT CARDIOVERSION N/A 09/23/2022   Procedure: TRANSESOPHAGEAL ECHOCARDIOGRAM (TEE);  Surgeon: Antoine Poche, MD;  Location: [facility];  Service: Endoscopy;  Laterality: N/A;   TOTAL SHOULDER ARTHROPLASTY Right 09/27/2015   Past Medical History:  Diagnosis Date   Anemia in chronic kidney disease (CKD) 02/07/2022   Anxiety    Arthritis    Ascites of liver    Weekly paracentesis   Cirrhosis of liver (HCC)    CKD (chronic kidney disease) stage 4,  GFR 15-29 ml/min (HCC)    GERD (gastroesophageal reflux disease)    Grade IV internal hemorrhoids    Hypertension    NASH (nonalcoholic steatohepatitis)    Neuropathy, diabetic (HCC)    Obesity (BMI 30-39.9)    OSA on CPAP    Pneumonia    Portal hypertension (HCC)    S/P TIPS (transjugular intrahepatic portosystemic shunt)    Thalassemia minor 1992   BP 111/70   Pulse (!) 104   Ht 5\' 11"  (1.803 m)   Wt 231 lb 9.6 oz (105.1 kg)   SpO2 95%   BMI 32.30 kg/m   Opioid Risk Score:   Fall Risk Score:  `1  Depression screen PHQ 2/9     04/27/2023    2:11 PM 02/17/2023    1:23 PM 04/04/2022    1:36 PM 01/31/2021    2:15 PM  Depression screen PHQ 2/9  Decreased Interest 0 0 0 0  Down, Depressed, Hopeless 0 0 0 0  PHQ - 2 Score 0 0 0 0  Altered sleeping  1    Tired, decreased energy  0    Change in appetite  0    Feeling bad or failure about yourself   0    Trouble concentrating  0    Moving slowly or fidgety/restless  0    Suicidal thoughts  0    PHQ-9 Score  1      Review of Systems  Genitourinary:  Positive for frequency. Negative for urgency.       Incontinence  Musculoskeletal:  Positive for gait problem.       Shoulder pain  Neurological:  Positive for tremors and weakness.  Hematological:  Bruises/bleeds easily.  Psychiatric/Behavioral:         Anxiety  All other systems reviewed and are negative.      Objective:   Physical Exam Gen: no distress, normal appearing HEENT: oral mucosa pink and moist, NCAT Cardio: Reg rate Chest: normal effort, normal rate of breathing Abd: soft, non-distended Ext: extremities edematous, bruising of right hand Psych: pleasant, normal affect Skin: intact Neuro: Alert and oriented x3      Assessment & Plan:  1) Cirrhosis: -discussed that she has been getting paracentesis weekly -discussed that when she developed this she developed hypotension  2) Anemia: -  discussed that hemoglobins have been stable  3)  Debility: -discussed that she is limited in ambulation due to her leg swelling -start PT and OT -discussed goals for walking and resistance training  4) Hypotension: -discussed that she does not feel dizzy unless she has just had paracentesis  5) Lower extremity edema: -compression garments ordered  6) Lumbar spinal stenosis: -discussed use of heating pad  7) Peripheral neuropathy: -Discussed Qutenza as an option for neuropathic pain control. Discussed that this is a capsaicin patch, stronger than capsaicin cream. Discussed that it is currently approved for diabetic peripheral neuropathy and post-herpetic neuralgia, but that it has also shown benefit in treating other forms of neuropathy. Provided patient with link to site to learn more about the patch: https://www.clark.biz/. Discussed that the patch would be placed in office and benefits usually last 3 months. Discussed that unintended exposure to capsaicin can cause severe irritation of eyes, mucous membranes, respiratory tract, and skin, but that Qutenza is a local treatment and does not have the systemic side effects of other nerve medications. Discussed that there may be pain, itching, erythema, and decreased sensory function associated with the application of Qutenza. Side effects usually subside within 1 week. A cold pack of analgesic medications can help with these side effects. Blood pressure can also be increased due to pain associated with administration of the patch.   8) Fatigue: -discussed that if Qutenza helps than we can wean her off the gabapentin, discussed she can start with changing the gabapentin frequency to just HS  4 patches of Qutenza was applied to the area of pain. Ice packs were applied during the procedure to ensure patient comfort. Blood pressure was monitored every 15 minutes. The patient tolerated the procedure well. Post-procedure instructions were given and follow-up has been scheduled.

## 2023-04-28 ENCOUNTER — Ambulatory Visit: Admission: RE | Admit: 2023-04-28 | Payer: Medicare Other | Source: Ambulatory Visit

## 2023-04-29 ENCOUNTER — Emergency Department (HOSPITAL_COMMUNITY): Payer: Medicare Other

## 2023-04-29 ENCOUNTER — Other Ambulatory Visit: Payer: Self-pay

## 2023-04-29 ENCOUNTER — Encounter (HOSPITAL_COMMUNITY): Payer: Self-pay | Admitting: *Deleted

## 2023-04-29 ENCOUNTER — Inpatient Hospital Stay (HOSPITAL_COMMUNITY)
Admission: EM | Admit: 2023-04-29 | Discharge: 2023-05-04 | DRG: 441 | Disposition: A | Discharge status: Skilled Nursing Facility | Payer: Medicare Other | Attending: Family Medicine | Admitting: Family Medicine

## 2023-04-29 DIAGNOSIS — E669 Obesity, unspecified: Secondary | ICD-10-CM | POA: Diagnosis present

## 2023-04-29 DIAGNOSIS — E871 Hypo-osmolality and hyponatremia: Secondary | ICD-10-CM | POA: Diagnosis not present

## 2023-04-29 DIAGNOSIS — R4182 Altered mental status, unspecified: Secondary | ICD-10-CM | POA: Diagnosis not present

## 2023-04-29 DIAGNOSIS — K7682 Hepatic encephalopathy: Principal | ICD-10-CM | POA: Diagnosis present

## 2023-04-29 DIAGNOSIS — D649 Anemia, unspecified: Secondary | ICD-10-CM | POA: Diagnosis present

## 2023-04-29 DIAGNOSIS — E785 Hyperlipidemia, unspecified: Secondary | ICD-10-CM | POA: Diagnosis present

## 2023-04-29 DIAGNOSIS — I129 Hypertensive chronic kidney disease with stage 1 through stage 4 chronic kidney disease, or unspecified chronic kidney disease: Secondary | ICD-10-CM | POA: Diagnosis present

## 2023-04-29 DIAGNOSIS — D631 Anemia in chronic kidney disease: Secondary | ICD-10-CM | POA: Diagnosis present

## 2023-04-29 DIAGNOSIS — N183 Chronic kidney disease, stage 3 unspecified: Secondary | ICD-10-CM | POA: Diagnosis present

## 2023-04-29 DIAGNOSIS — Z833 Family history of diabetes mellitus: Secondary | ICD-10-CM

## 2023-04-29 DIAGNOSIS — D329 Benign neoplasm of meninges, unspecified: Secondary | ICD-10-CM | POA: Diagnosis present

## 2023-04-29 DIAGNOSIS — Y929 Unspecified place or not applicable: Secondary | ICD-10-CM | POA: Diagnosis not present

## 2023-04-29 DIAGNOSIS — Z1152 Encounter for screening for COVID-19: Secondary | ICD-10-CM

## 2023-04-29 DIAGNOSIS — Z8616 Personal history of COVID-19: Secondary | ICD-10-CM | POA: Diagnosis not present

## 2023-04-29 DIAGNOSIS — Z91048 Other nonmedicinal substance allergy status: Secondary | ICD-10-CM

## 2023-04-29 DIAGNOSIS — E1140 Type 2 diabetes mellitus with diabetic neuropathy, unspecified: Secondary | ICD-10-CM | POA: Diagnosis present

## 2023-04-29 DIAGNOSIS — D696 Thrombocytopenia, unspecified: Secondary | ICD-10-CM | POA: Diagnosis present

## 2023-04-29 DIAGNOSIS — D509 Iron deficiency anemia, unspecified: Secondary | ICD-10-CM | POA: Diagnosis present

## 2023-04-29 DIAGNOSIS — Z91040 Latex allergy status: Secondary | ICD-10-CM

## 2023-04-29 DIAGNOSIS — I959 Hypotension, unspecified: Secondary | ICD-10-CM | POA: Diagnosis present

## 2023-04-29 DIAGNOSIS — E1122 Type 2 diabetes mellitus with diabetic chronic kidney disease: Secondary | ICD-10-CM | POA: Diagnosis present

## 2023-04-29 DIAGNOSIS — K7581 Nonalcoholic steatohepatitis (NASH): Secondary | ICD-10-CM | POA: Diagnosis present

## 2023-04-29 DIAGNOSIS — F32A Depression, unspecified: Secondary | ICD-10-CM | POA: Diagnosis present

## 2023-04-29 DIAGNOSIS — R188 Other ascites: Secondary | ICD-10-CM | POA: Diagnosis present

## 2023-04-29 DIAGNOSIS — E860 Dehydration: Secondary | ICD-10-CM | POA: Diagnosis present

## 2023-04-29 DIAGNOSIS — F419 Anxiety disorder, unspecified: Secondary | ICD-10-CM | POA: Diagnosis present

## 2023-04-29 DIAGNOSIS — Z8249 Family history of ischemic heart disease and other diseases of the circulatory system: Secondary | ICD-10-CM

## 2023-04-29 DIAGNOSIS — I2510 Atherosclerotic heart disease of native coronary artery without angina pectoris: Secondary | ICD-10-CM | POA: Diagnosis present

## 2023-04-29 DIAGNOSIS — N184 Chronic kidney disease, stage 4 (severe): Secondary | ICD-10-CM | POA: Diagnosis present

## 2023-04-29 DIAGNOSIS — E1165 Type 2 diabetes mellitus with hyperglycemia: Secondary | ICD-10-CM | POA: Diagnosis present

## 2023-04-29 DIAGNOSIS — K7460 Unspecified cirrhosis of liver: Secondary | ICD-10-CM | POA: Diagnosis present

## 2023-04-29 DIAGNOSIS — Z6832 Body mass index (BMI) 32.0-32.9, adult: Secondary | ICD-10-CM

## 2023-04-29 DIAGNOSIS — K219 Gastro-esophageal reflux disease without esophagitis: Secondary | ICD-10-CM | POA: Diagnosis present

## 2023-04-29 DIAGNOSIS — D569 Thalassemia, unspecified: Secondary | ICD-10-CM | POA: Diagnosis not present

## 2023-04-29 DIAGNOSIS — G9341 Metabolic encephalopathy: Secondary | ICD-10-CM | POA: Diagnosis present

## 2023-04-29 DIAGNOSIS — N1832 Chronic kidney disease, stage 3b: Secondary | ICD-10-CM | POA: Diagnosis not present

## 2023-04-29 DIAGNOSIS — J189 Pneumonia, unspecified organism: Secondary | ICD-10-CM | POA: Diagnosis present

## 2023-04-29 DIAGNOSIS — G4733 Obstructive sleep apnea (adult) (pediatric): Secondary | ICD-10-CM | POA: Diagnosis present

## 2023-04-29 DIAGNOSIS — Z79899 Other long term (current) drug therapy: Secondary | ICD-10-CM

## 2023-04-29 DIAGNOSIS — Z91128 Patient's intentional underdosing of medication regimen for other reason: Secondary | ICD-10-CM

## 2023-04-29 DIAGNOSIS — D563 Thalassemia minor: Secondary | ICD-10-CM | POA: Diagnosis present

## 2023-04-29 DIAGNOSIS — Z96611 Presence of right artificial shoulder joint: Secondary | ICD-10-CM | POA: Diagnosis present

## 2023-04-29 DIAGNOSIS — T473X6A Underdosing of saline and osmotic laxatives, initial encounter: Secondary | ICD-10-CM | POA: Diagnosis present

## 2023-04-29 DIAGNOSIS — Z888 Allergy status to other drugs, medicaments and biological substances status: Secondary | ICD-10-CM

## 2023-04-29 LAB — URINALYSIS, ROUTINE W REFLEX MICROSCOPIC
Bilirubin Urine: NEGATIVE
Glucose, UA: NEGATIVE mg/dL
Hgb urine dipstick: NEGATIVE
Ketones, ur: NEGATIVE mg/dL
Leukocytes,Ua: NEGATIVE
Nitrite: NEGATIVE
Protein, ur: NEGATIVE mg/dL
Specific Gravity, Urine: 1.012 (ref 1.005–1.030)
pH: 5 (ref 5.0–8.0)

## 2023-04-29 LAB — CBC WITH DIFFERENTIAL/PLATELET
Abs Immature Granulocytes: 0.02 10*3/uL (ref 0.00–0.07)
Basophils Absolute: 0 10*3/uL (ref 0.0–0.1)
Basophils Relative: 1 %
Eosinophils Absolute: 0 10*3/uL (ref 0.0–0.5)
Eosinophils Relative: 0 %
HCT: 25.4 % — ABNORMAL LOW (ref 36.0–46.0)
Hemoglobin: 8.1 g/dL — ABNORMAL LOW (ref 12.0–15.0)
Immature Granulocytes: 0 %
Lymphocytes Relative: 6 %
Lymphs Abs: 0.3 10*3/uL — ABNORMAL LOW (ref 0.7–4.0)
MCH: 23.8 pg — ABNORMAL LOW (ref 26.0–34.0)
MCHC: 31.9 g/dL (ref 30.0–36.0)
MCV: 74.5 fL — ABNORMAL LOW (ref 80.0–100.0)
Monocytes Absolute: 0.4 10*3/uL (ref 0.1–1.0)
Monocytes Relative: 7 %
Neutro Abs: 4.8 10*3/uL (ref 1.7–7.7)
Neutrophils Relative %: 86 %
Platelets: 146 10*3/uL — ABNORMAL LOW (ref 150–400)
RBC: 3.41 MIL/uL — ABNORMAL LOW (ref 3.87–5.11)
RDW: 23.3 % — ABNORMAL HIGH (ref 11.5–15.5)
WBC: 5.5 10*3/uL (ref 4.0–10.5)
nRBC: 0 % (ref 0.0–0.2)

## 2023-04-29 LAB — RESP PANEL BY RT-PCR (RSV, FLU A&B, COVID)  RVPGX2
Influenza A by PCR: NEGATIVE
Influenza B by PCR: NEGATIVE
Resp Syncytial Virus by PCR: NEGATIVE
SARS Coronavirus 2 by RT PCR: NEGATIVE

## 2023-04-29 LAB — COMPREHENSIVE METABOLIC PANEL
ALT: 30 U/L (ref 0–44)
AST: 53 U/L — ABNORMAL HIGH (ref 15–41)
Albumin: 2.9 g/dL — ABNORMAL LOW (ref 3.5–5.0)
Alkaline Phosphatase: 104 U/L (ref 38–126)
Anion gap: 10 (ref 5–15)
BUN: 50 mg/dL — ABNORMAL HIGH (ref 8–23)
CO2: 22 mmol/L (ref 22–32)
Calcium: 8.7 mg/dL — ABNORMAL LOW (ref 8.9–10.3)
Chloride: 102 mmol/L (ref 98–111)
Creatinine, Ser: 1.62 mg/dL — ABNORMAL HIGH (ref 0.44–1.00)
GFR, Estimated: 35 mL/min — ABNORMAL LOW (ref >60)
Glucose, Bld: 198 mg/dL — ABNORMAL HIGH (ref 70–99)
Potassium: 3.9 mmol/L (ref 3.5–5.1)
Sodium: 134 mmol/L — ABNORMAL LOW (ref 135–145)
Total Bilirubin: 2.8 mg/dL — ABNORMAL HIGH (ref 0.3–1.2)
Total Protein: 6.1 g/dL — ABNORMAL LOW (ref 6.5–8.1)

## 2023-04-29 LAB — STREP PNEUMONIAE URINARY ANTIGEN: Strep Pneumo Urinary Antigen: NEGATIVE

## 2023-04-29 LAB — AMMONIA: Ammonia: 68 umol/L — ABNORMAL HIGH (ref 9–35)

## 2023-04-29 LAB — LIPASE, BLOOD: Lipase: 37 U/L (ref 11–51)

## 2023-04-29 MED ORDER — VITAMIN B-12 1000 MCG PO TABS
1000.0000 ug | ORAL_TABLET | Freq: Every day | ORAL | Status: DC
  Administered 2023-04-29 – 2023-05-04 (×6): 1000 ug via ORAL
  Filled 2023-04-29 (×6): qty 1

## 2023-04-29 MED ORDER — AZITHROMYCIN 250 MG PO TABS
500.0000 mg | ORAL_TABLET | Freq: Every day | ORAL | Status: AC
  Administered 2023-04-29 – 2023-05-03 (×5): 500 mg via ORAL
  Filled 2023-04-29 (×5): qty 2

## 2023-04-29 MED ORDER — LACTULOSE 10 GM/15ML PO SOLN
30.0000 g | Freq: Three times a day (TID) | ORAL | Status: DC
  Administered 2023-04-29 – 2023-05-04 (×20): 30 g via ORAL
  Filled 2023-04-29 (×20): qty 60

## 2023-04-29 MED ORDER — ONDANSETRON HCL 4 MG/2ML IJ SOLN
4.0000 mg | Freq: Once | INTRAMUSCULAR | Status: AC
  Administered 2023-04-29: 4 mg via INTRAVENOUS
  Filled 2023-04-29: qty 2

## 2023-04-29 MED ORDER — VITAMIN D 25 MCG (1000 UNIT) PO TABS
1000.0000 [IU] | ORAL_TABLET | Freq: Every day | ORAL | Status: DC
  Administered 2023-04-29 – 2023-05-04 (×6): 1000 [IU] via ORAL
  Filled 2023-04-29 (×6): qty 1

## 2023-04-29 MED ORDER — VITAMIN D (ERGOCALCIFEROL) 1.25 MG (50000 UNIT) PO CAPS
50000.0000 [IU] | ORAL_CAPSULE | ORAL | Status: DC
  Administered 2023-04-29: 50000 [IU] via ORAL
  Filled 2023-04-29: qty 1

## 2023-04-29 MED ORDER — ADULT MULTIVITAMIN W/MINERALS CH
1.0000 | ORAL_TABLET | Freq: Every day | ORAL | Status: DC
  Administered 2023-04-29 – 2023-05-04 (×6): 1 via ORAL
  Filled 2023-04-29 (×6): qty 1

## 2023-04-29 MED ORDER — RIFAXIMIN 550 MG PO TABS
550.0000 mg | ORAL_TABLET | Freq: Two times a day (BID) | ORAL | Status: DC
  Administered 2023-04-29 – 2023-05-04 (×10): 550 mg via ORAL
  Filled 2023-04-29 (×10): qty 1

## 2023-04-29 MED ORDER — SODIUM CHLORIDE 0.9 % IV BOLUS
500.0000 mL | Freq: Once | INTRAVENOUS | Status: AC
  Administered 2023-04-29: 500 mL via INTRAVENOUS

## 2023-04-29 MED ORDER — SODIUM CHLORIDE 0.9 % IV SOLN
2.0000 g | INTRAVENOUS | Status: AC
  Administered 2023-04-29 – 2023-05-03 (×5): 2 g via INTRAVENOUS
  Filled 2023-04-29 (×5): qty 20

## 2023-04-29 MED ORDER — HEPARIN SODIUM (PORCINE) 5000 UNIT/ML IJ SOLN
5000.0000 [IU] | Freq: Three times a day (TID) | INTRAMUSCULAR | Status: DC
  Administered 2023-04-30 – 2023-05-04 (×11): 5000 [IU] via SUBCUTANEOUS
  Filled 2023-04-29 (×11): qty 1

## 2023-04-29 MED ORDER — LACTULOSE 10 GM/15ML PO SOLN
30.0000 g | Freq: Once | ORAL | Status: AC
  Administered 2023-04-29: 30 g via ORAL
  Filled 2023-04-29: qty 60

## 2023-04-29 NOTE — ED Triage Notes (Signed)
Pt brought in from home with c/o AMS that started yesterday. Pt's husband reports she has missed some doses of her Lactulose and is concerned her ammonia level is up.

## 2023-04-29 NOTE — ED Provider Notes (Signed)
[place] EMERGENCY DEPARTMENT AT [facility] Provider Note   CSN: [id number] Arrival date & time: 04/29/23  [id number]     History  Chief Complaint  Patient presents with   Altered Mental Status    Rachel Huber is a 67 y.o. female.  67 year old female with past medical history of [name], cirrhosis, diabetes, and coronary artery disease presenting to the emergency department today with confusion.  The patient forgot to take her medications of lactulose for a day or 2 and then yesterday she had some nausea and vomiting.  She has had 1 or 2 loose stools.  The patient's son-in-law apparently had what sounds like a viral gastroenteritis and the patient started to develop the symptoms yesterday.  She is not any vomiting today.  She was brought in for further evaluation due to to the increased confusion.  The patient has had issues with hepatic encephalopathy in the past and this is similar.  There has been no recent injuries or head injuries.  No recent fevers.   Altered Mental Status Presenting symptoms: confusion   Associated symptoms: nausea and vomiting        Home Medications Prior to Admission medications   Medication Sig Start Date End Date Taking? Authorizing Provider  acetaminophen (TYLENOL) 500 MG tablet Take 1 tablet (500 mg total) by mouth 2 (two) times daily as needed for mild pain. 01/26/23  Yes Angiulli, Mcarthur Rossetti, PA-C  atorvastatin (LIPITOR) 10 MG tablet Take 1 tablet (10 mg total) by mouth daily. 01/26/23  Yes Angiulli, Mcarthur Rossetti, PA-C  butalbital-acetaminophen-caffeine (FIORICET) [phone] MG tablet Take 1 tablet by mouth every 6 (six) hours as needed for headache. 01/26/23  Yes Angiulli, Mcarthur Rossetti, PA-C  Cholecalciferol (D 1000) 25 MCG (1000 UT) capsule Take 1,000 Units by mouth daily. 04/14/23  Yes [provider]  cyanocobalamin (VITAMIN B12) 500 MCG tablet Take 1,000 mcg by mouth daily. 06/28/21  Yes [provider]  diclofenac Sodium (VOLTAREN) 1  % GEL Apply 4 g topically 4 (four) times daily. Patient taking differently: Apply 4 g topically as needed. 01/26/23  Yes Angiulli, Mcarthur Rossetti, PA-C  gabapentin (NEURONTIN) 100 MG capsule Take 1 capsule (100 mg total) by mouth 2 (two) times daily. 02/17/23  Yes Raulkar, Drema Pry, MD  lactulose (CHRONULAC) 10 GM/15ML solution Take 30 mLs (20 g total) by mouth 3 (three) times daily. Patient taking differently: Take 10 mLs by mouth daily. 01/26/23  Yes Angiulli, Mcarthur Rossetti, PA-C  Multiple Vitamins-Minerals (MULTIVITAMIN WITH MINERALS) tablet Take 1 tablet by mouth daily.   Yes [provider]  sertraline (ZOLOFT) 50 MG tablet Take 1 tablet (50 mg total) by mouth daily. 01/26/23  Yes Angiulli, Mcarthur Rossetti, PA-C  spironolactone (ALDACTONE) 50 MG tablet Take 1 tablet (50 mg total) by mouth daily. 01/26/23  Yes Angiulli, Mcarthur Rossetti, PA-C  torsemide (DEMADEX) 20 MG tablet Take 40 mg by mouth daily. 01/26/23  Yes [provider]  Vitamin D, Ergocalciferol, (DRISDOL) 1.25 MG (50000 UNIT) CAPS capsule Take 1 capsule by mouth once a week 03/30/23  Yes Pennington, Rebekah M, PA-C  XIFAXAN 550 MG TABS tablet Take 550 mg by mouth 2 (two) times daily.   Yes [provider]  ciprofloxacin (CIPRO) 500 MG tablet Take 1 tablet (500 mg total) by mouth daily. Re-Start after finished with cefdinir 03/27/23 03/26/24  Catarina Hartshorn, MD      Allergies    Latex, Tape, and Gramineae pollens    Review of Systems  Review of Systems  Gastrointestinal:  Positive for diarrhea, nausea and vomiting.  Psychiatric/Behavioral:  Positive for confusion.   All other systems reviewed and are negative.   Physical Exam Updated Vital Signs BP (!) 116/47   Pulse 74   Temp 100.2 F (37.9 C) (Oral)   Resp 19   Ht 5\' 10"  (1.778 m)   Wt 104.3 kg   SpO2 95%   BMI 33.00 kg/m  Physical Exam Gen: NAD, the patient is very slow to answer questions and will occasionally stop talking midsentence Eyes: PERRL, EOMI HEENT: no  oropharyngeal swelling Neck: trachea midline Resp: clear to auscultation bilaterally Card: RRR, no murmurs, rubs, or gallops Abd: nontender, nondistended Extremities: no calf tenderness, no edema Vascular: 2+ radial pulses bilaterally, 2+ DP pulses bilaterally Neuro: Alert and oriented x 2, unsure of date and time, no focal deficits otherwise Skin: no rashes  ED Results / Procedures / Treatments   Labs (all labs ordered are listed, but only abnormal results are displayed) Labs Reviewed  CBC WITH DIFFERENTIAL/PLATELET - Abnormal; Notable for the following components:      Result Value   RBC 3.41 (*)    Hemoglobin 8.1 (*)    HCT 25.4 (*)    MCV 74.5 (*)    MCH 23.8 (*)    RDW 23.3 (*)    Platelets 146 (*)    Lymphs Abs 0.3 (*)    All other components within normal limits  COMPREHENSIVE METABOLIC PANEL - Abnormal; Notable for the following components:   Sodium 134 (*)    Glucose, Bld 198 (*)    BUN 50 (*)    Creatinine, Ser 1.62 (*)    Calcium 8.7 (*)    Total Protein 6.1 (*)    Albumin 2.9 (*)    AST 53 (*)    Total Bilirubin 2.8 (*)    GFR, Estimated 35 (*)    All other components within normal limits  AMMONIA - Abnormal; Notable for the following components:   Ammonia 68 (*)    All other components within normal limits  RESP PANEL BY RT-PCR (RSV, FLU A&B, COVID)  [id number]  LIPASE, BLOOD  URINALYSIS, ROUTINE W REFLEX MICROSCOPIC    EKG EKG Interpretation Date/Time:  Wednesday April 29 2023 08:55:26 EDT Ventricular Rate:  75 PR Interval:  60 QRS Duration:  89 QT Interval:  421 QTC Calculation: 471 R Axis:   1  Text Interpretation: Sinus rhythm Atrial premature complex Short PR interval Low voltage, precordial leads Borderline T wave abnormalities Confirmed by Beckey Downing [phone]) on 04/29/2023 11:43:15 AM  Radiology DG Chest Portable 1 View  Result Date: 04/29/2023 CLINICAL DATA:  Cough, altered mental status EXAM: PORTABLE CHEST 1 VIEW COMPARISON:  Prior  chest x-ray 01/08/2023 FINDINGS: Stable mild cardiomegaly. Right IJ approach single-lumen power injectable port catheter. Catheter tip overlies the upper atrium. Low inspiratory volumes. Increased pulmonary vascular congestion bordering on mild edema. Probable bibasilar atelectasis. Nonspecific foci of patchy airspace opacity in the bilateral upper lungs. Embolization coils noted in the left upper quadrant, likely within the splenic artery. Surgical changes of prior right reverse shoulder arthroplasty. IMPRESSION: 1. Low lung volumes with increased pulmonary vascular congestion bordering on mild edema. At least mild CHF is favored. 2. Nonspecific patchy airspace opacities in both upper lungs. Differential considerations include multifocal pneumonia versus more confluent areas of pulmonary edema. 3. Well-positioned port catheter. Electronically Signed   By: Malachy Moan M.D.   On: 04/29/2023 15:17   CT Head  Wo Contrast  Result Date: 04/29/2023 CLINICAL DATA:  Altered mental status, unknown cause EXAM: CT HEAD WITHOUT CONTRAST TECHNIQUE: Contiguous axial images were obtained from the base of the skull through the vertex without intravenous contrast. RADIATION DOSE REDUCTION: This exam was performed according to the departmental dose-optimization program which includes automated exposure control, adjustment of the mA and/or kV according to patient size and/or use of iterative reconstruction technique. COMPARISON:  03/25/2023 FINDINGS: Brain: No evidence of acute infarction, hemorrhage, parenchyma mass, mass effect, or midline shift. No hydrocephalus or extra-axial fluid collection. Redemonstrated calcified meningioma along the right temporal convexity, which measures up to 2.3 x 1.3 x 1.8 cm (AP x TR x CC), with unchanged mass effect on the right temporal lobe. Vascular: No hyperdense vessel. Skull: Negative for fracture or focal lesion. Sinuses/Orbits: Right sphenoid mucous retention cyst. Mild mucosal  thickening in the ethmoid air cells. Status post bilateral lens replacements. Other: Trace fluid in the right mastoid air cells. IMPRESSION: 1. No acute intracranial process. 2. Unchanged calcified meningioma along the right temporal convexity. Electronically Signed   By: Wiliam Ke M.D.   On: 04/29/2023 12:25    Procedures Procedures    Medications Ordered in ED Medications  ondansetron (ZOFRAN) injection 4 mg (4 mg Intravenous Given 04/29/23 1003)  sodium chloride 0.9 % bolus 500 mL (0 mLs Intravenous Stopped 04/29/23 1422)  lactulose (CHRONULAC) 10 GM/15ML solution 30 g (30 g Oral Given 04/29/23 1231)    ED Course/ Medical Decision Making/ A&P                                 Medical Decision Making 67 year old female with past medical history of [name] with cirrhosis and TIPS procedure x 2 as well as diabetes presenting to the emergency department today with confusion.  I suspect this likely due to hepatic encephalopathy.  I will further evaluate her here with basic labs as well as a urinalysis to evaluate for electrolyte abnormalities or urinary tract infection.  Will also obtain an ammonia level to evaluate for hepatic encephalopathy.  Her abdominal exam is reassuring here.  There is no tenderness here on exam.  Suspicion for SBP or acute intra-abdominal emergency is low at this time.  I will hold off on any imaging.  She denies any history of head trauma so hold off on any brain imaging at this time.  I will give the patient a small fluid bolus as she does appear dry on exam as well as some Zofran for her nausea.  I will reevaluate for ultimate disposition.  The patient's ammonia is elevated at 67.  She is given lactulose here.  It appears that has been higher in the past so I did just further workup with urinalysis and CT scan which were unremarkable.  The patient is having some bowel movements with the lactulose and her mentation does seem to be clearing.  Calls placed to hospitalist  service for admission as she is not quite back to her baseline but strongly suspect this is due to hepatic encephalopathy and her inability to take her lactulose due to her nausea, vomiting, and diarrhea.  Amount and/or Complexity of Data Reviewed Labs: ordered. Radiology: ordered.  Risk Prescription drug management. Decision regarding hospitalization.          Final Clinical Impression(s) / ED Diagnoses Final diagnoses:  Hepatic encephalopathy (HCC)  Disposition: admit   Rx / DC Orders ED Discharge  Orders     None         Durwin Glaze, MD 04/29/23 [phone]

## 2023-04-29 NOTE — ED Provider Notes (Signed)
  Provider Note MRN:  [id number]  Arrival date & time: 04/29/23    ED Course and Medical Decision Making  Assumed care from Dr Rhae Hammock at shift change.  See note from prior team for complete details, in brief:  67 yo female Hepatic encephalopathy Ammonia in 60's, improving w/ lactulose, still not at baseline Not taking meds per spouse Low grade temp, CXR concerning for ?pna Not septic Plan admission for likely hepatic encephalopathy Spoke w/ hospitalist      Procedures  Final Clinical Impressions(s) / ED Diagnoses     ICD-10-CM   1. Hepatic encephalopathy (HCC)  K76.82     2. Community acquired pneumonia, unspecified laterality  J18.9       ED Discharge Orders     None       Discharge Instructions   None        Sloan Leiter, DO 04/29/23 1656

## 2023-04-29 NOTE — H&P (Signed)
TRH H&P   Patient Demographics:    Rachel Huber, is a 67 y.o. female  MRN: [id number]   DOB - May 20, 1956  Admit Date - 04/29/2023  Outpatient Primary MD for the patient is Lauro Regulus, MD  Referring MD/NP/PA: Dr Rhae Hammock  Patient coming from: home  Chief Complaint  Patient presents with   Altered Mental Status      HPI:    Rachel Huber  is a 67 y.o. 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 -Patient was brought today secondary to altered mental status, she is confused unable to provide any history, it was obtained from husband by phone, ED physician, apparently she has not been taking her lactulose for last 2 days, had some nausea and vomiting yesterday, she had 1-2 loose stools,, her son-in-law apparently had what sounds like a viral gastroenteritis, she started to develop some of the symptoms yesterday. -in ED she was noted to be febrile 100.2,COVID was negative, chest x-ray concerning for pneumonia, creatinine at baseline 1.62, glucose elevated at 198, ammonia elevated at 68, hemoglobin stable at 8.1, platelets low at 146, treated for pneumonia, and received 1 dose of lactulose, and [facility] consulted to admit   Review of systems:    She is confused, unable to provide any reliable ROS   With Past History of the following :    Past Medical History:  Diagnosis Date   Anemia in chronic kidney disease (CKD) 02/07/2022   Anxiety    Arthritis    Ascites of liver     Weekly paracentesis   Cirrhosis of liver (HCC)    CKD (chronic kidney disease) stage 4, GFR 15-29 ml/min (HCC)    GERD (gastroesophageal reflux disease)    Grade IV internal hemorrhoids    Hypertension    NASH (nonalcoholic steatohepatitis)    Neuropathy, diabetic (HCC)    Obesity (BMI 30-39.9)    OSA on CPAP    Pneumonia    Portal hypertension (HCC)    S/P TIPS (transjugular intrahepatic portosystemic shunt)    Thalassemia minor 1992      Past Surgical History:  Procedure Laterality Date   ABDOMINAL HYSTERECTOMY     BREAST BIOPSY Right 02/15/2018   [place] bx 6-6:30 ribbon shape, ONE CORE FRAGMENT WITH FIBROSIS. ONE CORE FRAGMENT WITH PORTION OF A DILATED   BREAST BIOPSY Right 02/15/2018   [place] bx 9:00 heart shape, USUAL DUCTAL HYPERPLASIA   BREAST LUMPECTOMY Right 03/09/2018   Procedure: BREAST LUMPECTOMY x 2;  Surgeon: Sung Amabile, DO;  Location: [facility] ORS;  Service: General;  Laterality: Right;   CATARACT EXTRACTION W/PHACO Left 06/11/2016   Procedure: CATARACT EXTRACTION PHACO AND INTRAOCULAR LENS PLACEMENT (IOC);  Surgeon: Sallee Lange, MD;  Location: [facility];  Service: Ophthalmology;  Laterality: Left;  Lot # [id number] H US:01:38.6 AP%:26.4 CDE:44.15   CATARACT EXTRACTION W/PHACO Right 06/15/2018   Procedure: CATARACT EXTRACTION PHACO AND INTRAOCULAR LENS PLACEMENT (IOC);  Surgeon: Galen Manila, MD;  Location: [facility];  Service: Ophthalmology;  Laterality: Right;  [place] 00:38.2 CDE 4.23 Fluid Pack Lot # [id number]   COLONOSCOPIES     COLONOSCOPY WITH PROPOFOL N/A 10/10/2020   Procedure: COLONOSCOPY WITH PROPOFOL;  Surgeon: Regis Bill, MD;  Location: [facility] ENDOSCOPY;  Service: Endoscopy;  Laterality: N/A;   COLONOSCOPY WITH PROPOFOL N/A 11/20/2020   Procedure: COLONOSCOPY WITH PROPOFOL;  Surgeon: Regis Bill, MD;  Location: [facility] ENDOSCOPY;  Service: Endoscopy;  Laterality: N/A;  DM STAT CBC, BMP COVID POSITIVE 09/02/2020   COLONOSCOPY WITH PROPOFOL N/A  06/19/2022   Procedure: COLONOSCOPY WITH PROPOFOL;  Surgeon: Regis Bill, MD;  Location: [facility] ENDOSCOPY;  Service: Endoscopy;  Laterality: N/A;   CTR     ESOPHAGOGASTRODUODENOSCOPY (EGD) WITH PROPOFOL N/A 10/10/2020   Procedure: ESOPHAGOGASTRODUODENOSCOPY (EGD) WITH PROPOFOL;  Surgeon: Regis Bill, MD;  Location: [facility] ENDOSCOPY;  Service: Endoscopy;  Laterality: N/A;  COVID POSITIVE 10/08/2020   FLEXIBLE SIGMOIDOSCOPY N/A 02/06/2022   Procedure: FLEXIBLE SIGMOIDOSCOPY;  Surgeon: Regis Bill, MD;  Location: [facility] ENDOSCOPY;  Service: Endoscopy;  Laterality: N/A;  Patient requests anesthesia   FLEXIBLE SIGMOIDOSCOPY N/A 12/19/2022   Procedure: FLEXIBLE SIGMOIDOSCOPY;  Surgeon: Dolores Frame, MD;  Location: [facility];  Service: Gastroenterology;  Laterality: N/A;   IR ANGIOGRAM SELECTIVE EACH ADDITIONAL VESSEL  02/07/2022   IR ANGIOGRAM SELECTIVE EACH ADDITIONAL VESSEL  02/07/2022   IR ANGIOGRAM SELECTIVE EACH ADDITIONAL VESSEL  09/05/2022   IR ANGIOGRAM VISCERAL SELECTIVE  02/07/2022   IR ANGIOGRAM VISCERAL SELECTIVE  09/05/2022   IR CV LINE INJECTION  02/24/2023   IR EMBO ART  VEN HEMORR LYMPH EXTRAV  INC GUIDE ROADMAPPING  09/05/2022   IR EMBO ART  VEN HEMORR LYMPH EXTRAV  INC GUIDE ROADMAPPING  11/05/2022   IR EMBO ARTERIAL NOT HEMORR HEMANG INC GUIDE ROADMAPPING  02/07/2022   IR EMBO TUMOR ORGAN ISCHEMIA INFARCT INC GUIDE ROADMAPPING  01/06/2023   IR IMAGING GUIDED PORT INSERTION  06/13/2022   IR IMAGING GUIDED PORT INSERTION  11/25/2022   IR PARACENTESIS  12/17/2021   IR PARACENTESIS  03/25/2022   IR PARACENTESIS  11/25/2022   IR PARACENTESIS  01/06/2023   IR PARACENTESIS  01/19/2023   IR RADIOLOGIST EVAL & MGMT  03/18/2022   IR RADIOLOGIST EVAL & MGMT  05/07/2022   IR RADIOLOGIST EVAL & MGMT  05/16/2022   IR RADIOLOGIST EVAL & MGMT  10/13/2022   IR RADIOLOGIST EVAL & MGMT  12/12/2022   IR RADIOLOGIST EVAL & MGMT  03/30/2023   IR US GUIDE VASC ACCESS RIGHT  02/07/2022   IR US  GUIDE VASC ACCESS RIGHT  09/05/2022   JOINT REPLACEMENT     KNEE SURGERY Right 09/24/2017   plates and pins   PORT-A-CATH REMOVAL N/A 09/23/2022   Procedure: MINOR REMOVAL PORT-A-CATH;  Surgeon: Franky Macho, MD;  Location: [facility];  Service: General;  Laterality: N/A;   TEE WITHOUT CARDIOVERSION N/A 09/23/2022   Procedure: TRANSESOPHAGEAL ECHOCARDIOGRAM (TEE);  Surgeon: Antoine Poche, MD;  Location: [facility];  Service: Endoscopy;  Laterality: N/A;   TOTAL SHOULDER  ARTHROPLASTY Right 09/27/2015      Social History:     Social History   Tobacco Use   Smoking status: Never   Smokeless tobacco: Never  Substance Use Topics   Alcohol use: No        Family History :     Family History  Problem Relation Age of Onset   Breast cancer Mother 42   Lymphoma Mother    Diabetes Father    Kidney cancer Father    Heart disease Father    Diabetes Sister    Breast cancer Sister 40      Home Medications:   Prior to Admission medications   Medication Sig Start Date End Date Taking? Authorizing Provider  acetaminophen (TYLENOL) 500 MG tablet Take 1 tablet (500 mg total) by mouth 2 (two) times daily as needed for mild pain. 01/26/23  Yes Angiulli, Mcarthur Rossetti, PA-C  atorvastatin (LIPITOR) 10 MG tablet Take 1 tablet (10 mg total) by mouth daily. 01/26/23  Yes Angiulli, Mcarthur Rossetti, PA-C  butalbital-acetaminophen-caffeine (FIORICET) [phone] MG tablet Take 1 tablet by mouth every 6 (six) hours as needed for headache. 01/26/23  Yes Angiulli, Mcarthur Rossetti, PA-C  Cholecalciferol (D 1000) 25 MCG (1000 UT) capsule Take 1,000 Units by mouth daily. 04/14/23  Yes [provider]  cyanocobalamin (VITAMIN B12) 500 MCG tablet Take 1,000 mcg by mouth daily. 06/28/21  Yes [provider]  diclofenac Sodium (VOLTAREN) 1 % GEL Apply 4 g topically 4 (four) times daily. Patient taking differently: Apply 4 g topically as needed. 01/26/23  Yes Angiulli, Mcarthur Rossetti, PA-C  gabapentin (NEURONTIN) 100 MG  capsule Take 1 capsule (100 mg total) by mouth 2 (two) times daily. 02/17/23  Yes Raulkar, Drema Pry, MD  lactulose (CHRONULAC) 10 GM/15ML solution Take 30 mLs (20 g total) by mouth 3 (three) times daily. Patient taking differently: Take 10 mLs by mouth daily. 01/26/23  Yes Angiulli, Mcarthur Rossetti, PA-C  Multiple Vitamins-Minerals (MULTIVITAMIN WITH MINERALS) tablet Take 1 tablet by mouth daily.   Yes [provider]  sertraline (ZOLOFT) 50 MG tablet Take 1 tablet (50 mg total) by mouth daily. 01/26/23  Yes Angiulli, Mcarthur Rossetti, PA-C  spironolactone (ALDACTONE) 50 MG tablet Take 1 tablet (50 mg total) by mouth daily. 01/26/23  Yes Angiulli, Mcarthur Rossetti, PA-C  torsemide (DEMADEX) 20 MG tablet Take 40 mg by mouth daily. 01/26/23  Yes [provider]  Vitamin D, Ergocalciferol, (DRISDOL) 1.25 MG (50000 UNIT) CAPS capsule Take 1 capsule by mouth once a week 03/30/23  Yes Pennington, Rebekah M, PA-C  XIFAXAN 550 MG TABS tablet Take 550 mg by mouth 2 (two) times daily.   Yes [provider]  ciprofloxacin (CIPRO) 500 MG tablet Take 1 tablet (500 mg total) by mouth daily. Re-Start after finished with cefdinir 03/27/23 03/26/24  Catarina Hartshorn, MD     Allergies:     Allergies  Allergen Reactions   Latex Rash    Contact rash   Tape Other (See Comments)    Skin sensitivity   Gramineae Pollens Other (See Comments)    Sneezing, Running nose  Other Reaction(s): Other (see comments)     Physical Exam:   Vitals  Blood pressure 110/84, pulse 77, temperature 100.2 F (37.9 C), temperature source Oral, resp. rate (!) 22, height 5\' 10"  (1.778 m), weight 104.3 kg, SpO2 100%.   1. General Frail, chronically ill-appearing female, laying in bed  2.  She is awake, oriented x 1, unable to provide reliable  answers, some simple commands  3. No F.N deficits, ALL C.Nerves Intact, Strength 5/5 all 4 extremities, Sensation intact all 4 extremities, Plantars down going.  4. Ears and Eyes appear Normal,  Conjunctivae clear, PERRLA. Moist Oral Mucosa.  5. Supple Neck, No JVD, No cervical lymphadenopathy appriciated, No Carotid Bruits.  6. Symmetrical Chest wall movement, Good air movement bilaterally, CTAB.  7. RRR, No Gallops, Rubs or Murmurs, No Parasternal Heave.  8. Positive Bowel Sounds, Abdomen Soft, No tenderness, No organomegaly appriciated,No rebound -guarding or rigidity.  9.  No Cyanosis, Normal Skin Turgor, No Skin Rash or Bruise.  10. Good muscle tone,  joints appear normal , no effusions, Normal ROM.    Data Review:    CBC Recent Labs  Lab 04/29/23 0922  WBC 5.5  HGB 8.1*  HCT 25.4*  PLT 146*  MCV 74.5*  MCH 23.8*  MCHC 31.9  RDW 23.3*  LYMPHSABS 0.3*  MONOABS 0.4  EOSABS 0.0  BASOSABS 0.0   ------------------------------------------------------------------------------------------------------------------  Chemistries  Recent Labs  Lab 04/29/23 0922  NA 134*  K 3.9  CL 102  CO2 22  GLUCOSE 198*  BUN 50*  CREATININE 1.62*  CALCIUM 8.7*  AST 53*  ALT 30  ALKPHOS 104  BILITOT 2.8*   ------------------------------------------------------------------------------------------------------------------ estimated creatinine clearance is 45.3 mL/min (A) (by C-G formula based on SCr of 1.62 mg/dL (H)). ------------------------------------------------------------------------------------------------------------------ No results for input(s): "TSH", "T4TOTAL", "T3FREE", "THYROIDAB" in the last 72 hours.  Invalid input(s): "FREET3"  Coagulation profile No results for input(s): "INR", "PROTIME" in the last 168 hours. ------------------------------------------------------------------------------------------------------------------- No results for input(s): "DDIMER" in the last 72 hours. -------------------------------------------------------------------------------------------------------------------  Cardiac Enzymes No results for input(s): "CKMB",  "TROPONINI", "MYOGLOBIN" in the last 168 hours.  Invalid input(s): "CK" ------------------------------------------------------------------------------------------------------------------    Component Value Date/Time   BNP 302.0 (H) 11/17/2022 0114     ---------------------------------------------------------------------------------------------------------------  Urinalysis    Component Value Date/Time   COLORURINE YELLOW 04/29/2023 1533   APPEARANCEUR CLEAR 04/29/2023 1533   LABSPEC 1.012 04/29/2023 1533   PHURINE 5.0 04/29/2023 1533   GLUCOSEU NEGATIVE 04/29/2023 1533   HGBUR NEGATIVE 04/29/2023 1533   BILIRUBINUR NEGATIVE 04/29/2023 1533   KETONESUR NEGATIVE 04/29/2023 1533   PROTEINUR NEGATIVE 04/29/2023 1533   NITRITE NEGATIVE 04/29/2023 1533   LEUKOCYTESUR NEGATIVE 04/29/2023 1533    ----------------------------------------------------------------------------------------------------------------   Imaging Results:    DG Chest Portable 1 View  Result Date: 04/29/2023 CLINICAL DATA:  Cough, altered mental status EXAM: PORTABLE CHEST 1 VIEW COMPARISON:  Prior chest x-ray 01/08/2023 FINDINGS: Stable mild cardiomegaly. Right IJ approach single-lumen power injectable port catheter. Catheter tip overlies the upper atrium. Low inspiratory volumes. Increased pulmonary vascular congestion bordering on mild edema. Probable bibasilar atelectasis. Nonspecific foci of patchy airspace opacity in the bilateral upper lungs. Embolization coils noted in the left upper quadrant, likely within the splenic artery. Surgical changes of prior right reverse shoulder arthroplasty. IMPRESSION: 1. Low lung volumes with increased pulmonary vascular congestion bordering on mild edema. At least mild CHF is favored. 2. Nonspecific patchy airspace opacities in both upper lungs. Differential considerations include multifocal pneumonia versus more confluent areas of pulmonary edema. 3. Well-positioned port  catheter. Electronically Signed   By: Malachy Moan M.D.   On: 04/29/2023 15:17   CT Head Wo Contrast  Result Date: 04/29/2023 CLINICAL DATA:  Altered mental status, unknown cause EXAM: CT HEAD WITHOUT CONTRAST TECHNIQUE: Contiguous axial images were obtained from the base of the skull through the vertex without intravenous contrast. RADIATION DOSE REDUCTION: This  exam was performed according to the departmental dose-optimization program which includes automated exposure control, adjustment of the mA and/or kV according to patient size and/or use of iterative reconstruction technique. COMPARISON:  03/25/2023 FINDINGS: Brain: No evidence of acute infarction, hemorrhage, parenchyma mass, mass effect, or midline shift. No hydrocephalus or extra-axial fluid collection. Redemonstrated calcified meningioma along the right temporal convexity, which measures up to 2.3 x 1.3 x 1.8 cm (AP x TR x CC), with unchanged mass effect on the right temporal lobe. Vascular: No hyperdense vessel. Skull: Negative for fracture or focal lesion. Sinuses/Orbits: Right sphenoid mucous retention cyst. Mild mucosal thickening in the ethmoid air cells. Status post bilateral lens replacements. Other: Trace fluid in the right mastoid air cells. IMPRESSION: 1. No acute intracranial process. 2. Unchanged calcified meningioma along the right temporal convexity. Electronically Signed   By: Wiliam Ke M.D.   On: 04/29/2023 12:25    EKG:  Vent. rate 75 BPM PR interval 60 ms QRS duration 89 ms QT/QTcB 421/471 ms P-R-T axes 52 1 5 Sinus rhythm Atrial premature complex Short PR interval Low voltage, precordial leads Borderline T wave abnormalities  Assessment & Plan:    Principal Problem:   AMS (altered mental status) Active Problems:   Liver cirrhosis secondary to NASH (HCC)   HLD (hyperlipidemia)   OSA on CPAP   Obesity (BMI 30-39.9)   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 mellitus (HCC)    AMS acute metabolic  encephalopathy. Acute hepatic encephalopathy.   -Patient presented with increased confusion, this is multifactorial, mainly related to her hyperammonemia and acute hepatic encephalopathy, as well as can be related to infectious process close negative pneumonia, -I will hold Lexapro and gabapentin -As discussed with husband, she has not been compliant with her lactulose for last 2 days, ammonia is at 68, will resume at the higher dose lactulose 30 g, decrease frequency to 4 times daily till ammonia level improves -Will treat her pneumonia  Hyperammonemia -Please see above discussion -Continue with rifaximin  Pneumonia -She is febrile 100.2, UA is negative, COVID is negative. -Chest x-ray concerning for pneumonia, will start on IV Rocephin and azithromycin, will check sputum culture, Legionella and strep pneumonia antigen  Cirrhosis due to NASH -Will hold her torsemide and Aldactone due to acute illness, will resume once oral intake is reliable -See above discussion regarding lactulose -Continue with rifaximin   Microcytic anemia Anemia of chronic kidney disease -Hemoglobin at 8.1, transfuse as needed   CKD stage IIIb -Baseline creatinine 1.3-1.6 -Monitor renal function  Type 2 DM with hyperglycemia Diet Controlled.   Depression with anxiety -Home Lexapro and stable  Thrombocytopenia -this is secondary to liver disease, platelet count of 142K, continue to monitor, SCD for DVT prophylaxis     DVT Prophylaxis SCD for now, hold on pharmacologic DVT prophylaxis given history of anemia requiring frequent PRBC transfusion  AM Labs Ordered, also please review Full Orders  Family Communication: Admission, patients condition and plan of care including tests being ordered have been discussed with the patient and husband by phone who indicate understanding and agree with the plan and Code Status.  Code Status full  Likely DC to  home  Condition GUARDED    Consults called: none     Admission status: inpatient    Time spent in minutes : 70 minutes   Huey Bienenstock M.D on 04/29/2023 at 5:21 PM   [facility] - Office  [phone]

## 2023-04-30 ENCOUNTER — Encounter (HOSPITAL_COMMUNITY): Payer: Medicare Other | Admitting: Occupational Therapy

## 2023-04-30 ENCOUNTER — Ambulatory Visit (HOSPITAL_COMMUNITY): Payer: Medicare Other | Admitting: Physical Therapy

## 2023-04-30 DIAGNOSIS — R4182 Altered mental status, unspecified: Secondary | ICD-10-CM | POA: Diagnosis not present

## 2023-04-30 LAB — CBC
HCT: 28.8 % — ABNORMAL LOW (ref 36.0–46.0)
Hemoglobin: 8.7 g/dL — ABNORMAL LOW (ref 12.0–15.0)
MCH: 23.2 pg — ABNORMAL LOW (ref 26.0–34.0)
MCHC: 30.2 g/dL (ref 30.0–36.0)
MCV: 76.8 fL — ABNORMAL LOW (ref 80.0–100.0)
Platelets: 155 10*3/uL (ref 150–400)
RBC: 3.75 MIL/uL — ABNORMAL LOW (ref 3.87–5.11)
RDW: 23.6 % — ABNORMAL HIGH (ref 11.5–15.5)
WBC: 6 10*3/uL (ref 4.0–10.5)
nRBC: 0 % (ref 0.0–0.2)

## 2023-04-30 LAB — COMPREHENSIVE METABOLIC PANEL
ALT: 26 U/L (ref 0–44)
AST: 47 U/L — ABNORMAL HIGH (ref 15–41)
Albumin: 2.8 g/dL — ABNORMAL LOW (ref 3.5–5.0)
Alkaline Phosphatase: 96 U/L (ref 38–126)
Anion gap: 14 (ref 5–15)
BUN: 46 mg/dL — ABNORMAL HIGH (ref 8–23)
CO2: 16 mmol/L — ABNORMAL LOW (ref 22–32)
Calcium: 9 mg/dL (ref 8.9–10.3)
Chloride: 106 mmol/L (ref 98–111)
Creatinine, Ser: 1.48 mg/dL — ABNORMAL HIGH (ref 0.44–1.00)
GFR, Estimated: 39 mL/min — ABNORMAL LOW (ref >60)
Glucose, Bld: 164 mg/dL — ABNORMAL HIGH (ref 70–99)
Potassium: 3.3 mmol/L — ABNORMAL LOW (ref 3.5–5.1)
Sodium: 136 mmol/L (ref 135–145)
Total Bilirubin: 2 mg/dL — ABNORMAL HIGH (ref 0.3–1.2)
Total Protein: 6.3 g/dL — ABNORMAL LOW (ref 6.5–8.1)

## 2023-04-30 LAB — LEGIONELLA PNEUMOPHILA SEROGP 1 UR AG: L. pneumophila Serogp 1 Ur Ag: NEGATIVE

## 2023-04-30 LAB — AMMONIA: Ammonia: 72 umol/L — ABNORMAL HIGH (ref 9–35)

## 2023-04-30 LAB — PROCALCITONIN: Procalcitonin: 0.78 ng/mL

## 2023-04-30 LAB — MAGNESIUM: Magnesium: 1.9 mg/dL (ref 1.7–2.4)

## 2023-04-30 MED ORDER — POTASSIUM CHLORIDE CRYS ER 20 MEQ PO TBCR
40.0000 meq | EXTENDED_RELEASE_TABLET | Freq: Once | ORAL | Status: AC
  Administered 2023-04-30: 40 meq via ORAL
  Filled 2023-04-30: qty 2

## 2023-04-30 MED ORDER — HYDROMORPHONE HCL 1 MG/ML IJ SOLN: 1.0000 mg | INTRAMUSCULAR | Status: DC | PRN

## 2023-04-30 MED ORDER — CHLORHEXIDINE GLUCONATE CLOTH 2 % EX PADS
6.0000 | MEDICATED_PAD | Freq: Every day | CUTANEOUS | Status: DC
  Administered 2023-04-30 – 2023-05-04 (×6): 6 via TOPICAL

## 2023-04-30 MED ORDER — LACTULOSE ENEMA
300.0000 mL | Freq: Once | ORAL | Status: AC
  Administered 2023-04-30: 300 mL via RECTAL
  Filled 2023-04-30: qty 300

## 2023-04-30 MED ORDER — OXYCODONE HCL 5 MG PO TABS
5.0000 mg | ORAL_TABLET | ORAL | Status: DC | PRN
  Administered 2023-04-30 – 2023-05-03 (×2): 5 mg via ORAL
  Filled 2023-04-30 (×2): qty 1

## 2023-04-30 NOTE — Hospital Course (Addendum)
Rachel Huber  is a 67 y.o. 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 -Patient was brought today secondary to altered mental status, she is confused unable to provide any history, it was obtained from husband by phone, ED physician, apparently she has not been taking her lactulose for last 2 days, had some nausea and vomiting yesterday, she had 1-2 loose stools,, her son-in-law apparently had what sounds like a viral gastroenteritis, she started to develop some of the symptoms yesterday.  -in ED she was noted to be febrile 100.2,COVID was negative, chest x-ray concerning for pneumonia, creatinine at baseline 1.62, glucose elevated at 198, ammonia elevated at 68, hemoglobin stable at 8.1, platelets low at 146, treated for pneumonia, and received 1 dose of lactulose, and [facility] consulted to admit.    Assessment & Plan:     Principal Problem:   AMS (altered mental status) Active Problems:   Liver cirrhosis secondary to NASH (HCC)   HLD (hyperlipidemia)   OSA on CPAP   Obesity (BMI 30-39.9)   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 mellitus (HCC)       AMS- Acute metabolic encephalopathy. Acute hepatic encephalopathy. -Patient back to baseline, pleasantly confused, with slow speech in response  -MRI of the brain reported chronic meningioma, no acute stroke  -Per husband mentation is at baseline now     -Confusion due to hyperammonemia, acute hepatic encephalopathy Also may be contributed by infection possible pneumonia  -Continue to hold Lexapro and gabapentin  -As discussed  with husband, she has not been compliant with her lactulose for last 2 days,  - Ammonia level 68 >>72 >>> 61 >>26    -Will encourage p.o. lactulose 30 g 4 times daily -Will monitor ammonia level -Will treat her pneumonia   Hyperammonemia -Please see above discussion -Will continue p.o. lactulose,And Rifaximin   Pneumonia Hemodynamically stable and POA: febrile 100.2, UA is negative, COVID is negative. -Chest x-ray concerning for pneumonia, >> on IV Rocephin and azithromycin,  -Cultures remain negative, with no growth    Cirrhosis due to NASH -Anticipating resuming torsemide and Aldactone As her p.o. intake improves -Continue p.o. lactulose -Continue with rifaximin   Microcytic anemia Anemia of chronic kidney disease -Hemoglobin remained stable, monitoring   CKD stage IIIb -Baseline creatinine 1.3-1.6 Lab Results  Component Value Date   CREATININE 1.25 (H) 05/03/2023   CREATININE 1.35 (H) 05/02/2023   CREATININE 1.74 (H) 05/01/2023    -Monitor renal function   Mild dehydration/hyponatremia -Initiate gentle IV fluid hydration LR 1 20Kcl  Type 2 DM with hyperglycemia Diet Controlled. -CBG q. ACHS   Depression with anxiety -Home Lexapro -on hold  Anemia of chronic disease due to liver cirrhosis  -Monitor H&H, dropping -Obtain Hemoccult -Transfuse if hemoglobin drops below 7 or become symptomatic     Latest Ref Rng & Units 05/03/2023    8:41 AM 05/03/2023    5:15 AM 05/02/2023    4:19 AM  CBC  WBC 4.0 - 10.5 K/uL  6.1  6.3   Hemoglobin 12.0 - 15.0 g/dL 7.2  7.1  7.5   Hematocrit 36.0 - 46.0 % 23.6  23.7  24.1   Platelets 150 - 400 K/uL  168  156        thrombocytopenia -Monitoring -this is secondary to liver disease, monitoring platelet counts, continue to monitor, SCD for DVT prophylaxis       DVT Prophylaxis SCD for now, hold on pharmacologic DVT prophylaxis given history of anemia requiring frequent PRBC transfusion

## 2023-04-30 NOTE — Progress Notes (Signed)
PROGRESS NOTE    Patient: Rachel Huber                            PCP: Lauro Regulus, MD                    DOB: June 15, 1956            DOA: 04/29/2023 [id number]             DOS: 04/30/2023, 12:12 PM   LOS: 1 day   Date of Service: The patient was seen and examined on 04/30/2023  Subjective:   The patient was seen and examined this morning. Vomiting, otherwise hemodynamically stable, unable to tolerate any p.o. more awake but still confused  Brief Narrative:   Kirsi Kalata  is a 67 y.o. 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 -Patient was brought today secondary to altered mental status, she is confused unable to provide any history, it was obtained from husband by phone, ED physician, apparently she has not been taking her lactulose for last 2 days, had some nausea and vomiting yesterday, she had 1-2 loose stools,, her son-in-law apparently had what sounds like a viral gastroenteritis, she started to develop some of the symptoms yesterday.  -in ED she was noted to be febrile 100.2,COVID was negative, chest x-ray concerning for pneumonia, creatinine at baseline 1.62, glucose elevated at 198, ammonia elevated at 68, hemoglobin stable at 8.1, platelets low at 146, treated for pneumonia, and received 1 dose of lactulose, and [facility] consulted to admit.    Assessment & Plan:     Principal Problem:   AMS (altered mental status) Active Problems:   Liver cirrhosis secondary to NASH (HCC)   HLD (hyperlipidemia)   OSA on CPAP   Obesity (BMI 30-39.9)   CKD stage 3  due to type 2 diabetes mellitus (HCC)       AMS- Acute metabolic encephalopathy. Acute hepatic encephalopathy.   -Awake still confused, oriented x 1, -Fusion related to hyperammonemia, acute hepatic encephalopathy Also may be contributed by infection possible pneumonia  -Continue to hold Lexapro and gabapentin  -As discussed with husband, she has not been compliant with her lactulose for last 2 days,  - Ammonia level 68 >>72  -As she cannot tolerate oral lactulose at this point, initiating rectal lactulose enema -Will encourage p.o. lactulose 30 g 4 times daily -Will monitor ammonia level -Will treat her pneumonia   Hyperammonemia -Please see above discussion -Continue with rifaximin   Pneumonia -She is febrile 100.2, UA is negative, COVID is negative. -Chest x-ray concerning for pneumonia, will start on IV Rocephin and azithromycin, will check sputum culture, Legionella and strep pneumonia antigen   Cirrhosis due to NASH -Will hold her torsemide and Aldactone due to acute illness, will resume once oral intake is reliable -See above discussion regarding lactulose -Continue with rifaximin   Microcytic anemia Anemia of chronic kidney disease -Hemoglobin at 8.1, transfuse as needed   CKD stage IIIb -Baseline creatinine 1.3-1.6 Lab Results  Component Value Date   CREATININE 1.48 (H) 04/30/2023   CREATININE 1.62 (  H) 04/29/2023   CREATININE 1.60 (H) 04/22/2023    -Monitor renal function   Type 2 DM with hyperglycemia Diet Controlled.   Depression with anxiety -Home Lexapro and stable   Thrombocytopenia -this is secondary to liver disease, platelet count of 142K, continue to monitor, SCD for DVT prophylaxis       DVT Prophylaxis SCD for now, hold on pharmacologic DVT prophylaxis given history of anemia requiring frequent PRBC  transfusion     ----------------------------------------------------------------------------------------------------------------------------------------------- Nutritional status:  The patient's BMI is: Body mass index is 32.91 kg/m. I agree with the assessment and plan as outlined -----------------------------------------------------------------------------------------------------------------------  DVT prophylaxis:  heparin injection 5,000 Units Start: 04/30/23 2200 SCDs Start: 04/29/23 1836   Code Status:   Code Status: Full Code  Family Communication: No family member present at bedside- attempt will be made to update daily -Advance care planning has been discussed.   Admission status:   Status is: Inpatient Remains inpatient appropriate because: Needing IV fluids, intolerant to p.o. medication including lactulose needing enema lactulose-IV antibiotics   Disposition: From  - home             Planning for discharge in 1-2 days: to   Procedures:   No admission procedures for hospital encounter.   Antimicrobials:  Anti-infectives (From admission, onward)    Start     Dose/Rate Route Frequency Ordered Stop   04/29/23 2200  rifaximin (XIFAXAN) tablet 550 mg        550 mg Oral 2 times daily 04/29/23 1835     04/29/23 1715  cefTRIAXone (ROCEPHIN) 2 g in sodium chloride 0.9 % 100 mL IVPB        2 g 200 mL/hr over 30 Minutes Intravenous Every 24 hours 04/29/23 1709 05/04/23 1714   04/29/23 1715  azithromycin (ZITHROMAX) tablet 500 mg        500 mg Oral Daily 04/29/23 1709 05/04/23 0959        Medication:   azithromycin  500 mg Oral Daily   Chlorhexidine Gluconate Cloth  6 each Topical Daily   cholecalciferol  1,000 Units Oral Daily   cyanocobalamin  1,000 mcg Oral Daily   heparin  5,000 Units Subcutaneous Q8H   lactulose  30 g Oral TID PC & HS   multivitamin with minerals  1 tablet Oral Daily   rifaximin  550 mg Oral BID   Vitamin D (Ergocalciferol)  50,000  Units Oral Weekly       Objective:   Vitals:   04/29/23 1700 04/29/23 1839 04/29/23 2108 04/30/23 0520  BP:  (!) 114/52 (!) 123/50 (!) 138/53  Pulse: 77 72 71 85  Resp: (!) 22 18    Temp:  99.3 F (37.4 C) 98.4 F (36.9 C) 97.7 F (36.5 C)  TempSrc:  Oral Axillary Oral  SpO2: 100% 97% 93% 97%  Weight:  101.1 kg    Height:  5\' 9"  (1.753 m)      Intake/Output Summary (Last 24 hours) at 04/30/2023 1212 Last data filed at 04/29/2023 1534 Gross per 24 hour  Intake --  Output 13 ml  Net -13 ml   Filed Weights   04/29/23 0847 04/29/23 1839  Weight: 104.3 kg 101.1 kg     Physical examination:   Constitution:  AAO X1 ,  cooperative,  HEENT:        Normocephalic, PERRL, otherwise with in Normal limits  Chest:         Chest symmetric Cardio vascular:  S1/S2, RRR, No murmure,  No Rubs or Gallops  pulmonary: Clear to auscultation bilaterally, respirations unlabored, negative wheezes / crackles Abdomen: Soft, non-tender, non-distended, bowel sounds,no masses, no organomegaly Muscular skeletal: Limited exam - in bed, able to move all 4 extremities,   Neuro: CNII-XII intact. , normal motor and sensation, reflexes intact  Extremities: No pitting edema lower extremities, +2 pulses  Skin: Dry, warm to touch, negative for any Rashes, No open wounds Wounds: per nursing documentation   ------------------------------------------------------------------------------------------------------------------------------------------    LABs:     Latest Ref Rng & Units 04/30/2023    4:23 AM 04/29/2023    9:22 AM 04/22/2023    2:18 PM  CBC  WBC 4.0 - 10.5 K/uL 6.0  5.5  4.6   Hemoglobin 12.0 - 15.0 g/dL 8.7  8.1  6.6   Hematocrit 36.0 - 46.0 % 28.8  25.4  21.1   Platelets 150 - 400 K/uL 155  146  207       Latest Ref Rng & Units 04/30/2023    4:23 AM 04/29/2023    9:22 AM 04/22/2023    2:18 PM  CMP  Glucose 70 - 99 mg/dL 161  096  045   BUN 8 - 23 mg/dL 46  50  42   Creatinine 0.44 - 1.00  mg/dL 4.09  8.11  9.14   Sodium 135 - 145 mmol/L 136  134  132   Potassium 3.5 - 5.1 mmol/L 3.3  3.9  3.6   Chloride 98 - 111 mmol/L 106  102  101   CO2 22 - 32 mmol/L 16  22  23    Calcium 8.9 - 10.3 mg/dL 9.0  8.7  8.4   Total Protein 6.5 - 8.1 g/dL 6.3  6.1  6.1   Total Bilirubin 0.3 - 1.2 mg/dL 2.0  2.8  1.9   Alkaline Phos 38 - 126 U/L 96  104  127   AST 15 - 41 U/L 47  53  42   ALT 0 - 44 U/L 26  30  22         Micro Results Recent Results (from the past 240 hour(s))  Resp panel by RT-PCR (RSV, Flu A&B, Covid) Urine, In & Out Cath     Status: None   Collection Time: 04/29/23  3:33 PM   Specimen: Urine, In & Out Cath; Nasal Swab  Result Value Ref Range Status   SARS Coronavirus 2 by RT PCR NEGATIVE NEGATIVE Final    Comment: (NOTE) SARS-CoV-2 target nucleic acids are NOT DETECTED.  The SARS-CoV-2 RNA is generally detectable in upper respiratory specimens during the acute phase of infection. The lowest concentration of SARS-CoV-2 viral copies this assay can detect is 138 copies/mL. A negative result does not preclude SARS-Cov-2 infection and should not be used as the sole basis for treatment or other patient management decisions. A negative result may occur with  improper specimen collection/handling, submission of specimen other than nasopharyngeal swab, presence of viral mutation(s) within the areas targeted by this assay, and inadequate number of viral copies(<138 copies/mL). A negative result must be combined with clinical observations, patient history, and epidemiological information. The expected result is Negative.  Fact Sheet for Patients:  BloggerCourse.com  Fact Sheet for Healthcare Providers:  SeriousBroker.it  This test is no t yet approved or cleared by the [place] FDA and  has been authorized for detection and/or diagnosis of SARS-CoV-2 by FDA under an Emergency Use Authorization (EUA). This EUA will  remain  in effect (meaning this  test can be used) for the duration of the COVID-19 declaration under Section 564(b)(1) of the Act, 21 U.S.C.section 360bbb-3(b)(1), unless the authorization is terminated  or revoked sooner.       Influenza A by PCR NEGATIVE NEGATIVE Final   Influenza B by PCR NEGATIVE NEGATIVE Final    Comment: (NOTE) The Xpert Xpress SARS-CoV-2/FLU/RSV plus assay is intended as an aid in the diagnosis of influenza from Nasopharyngeal swab specimens and should not be used as a sole basis for treatment. Nasal washings and aspirates are unacceptable for Xpert Xpress SARS-CoV-2/FLU/RSV testing.  Fact Sheet for Patients: BloggerCourse.com  Fact Sheet for Healthcare Providers: SeriousBroker.it  This test is not yet approved or cleared by the [place] FDA and has been authorized for detection and/or diagnosis of SARS-CoV-2 by FDA under an Emergency Use Authorization (EUA). This EUA will remain in effect (meaning this test can be used) for the duration of the COVID-19 declaration under Section 564(b)(1) of the Act, 21 U.S.C. section 360bbb-3(b)(1), unless the authorization is terminated or revoked.     Resp Syncytial Virus by PCR NEGATIVE NEGATIVE Final    Comment: (NOTE) Fact Sheet for Patients: BloggerCourse.com  Fact Sheet for Healthcare Providers: SeriousBroker.it  This test is not yet approved or cleared by the [place] FDA and has been authorized for detection and/or diagnosis of SARS-CoV-2 by FDA under an Emergency Use Authorization (EUA). This EUA will remain in effect (meaning this test can be used) for the duration of the COVID-19 declaration under Section 564(b)(1) of the Act, 21 U.S.C. section 360bbb-3(b)(1), unless the authorization is terminated or revoked.  Performed at [facility], [address], [place], [place] [address]      Radiology Reports DG Chest Portable 1 View  Result Date: 04/29/2023 CLINICAL DATA:  Cough, altered mental status EXAM: PORTABLE CHEST 1 VIEW COMPARISON:  Prior chest x-ray 01/08/2023 FINDINGS: Stable mild cardiomegaly. Right IJ approach single-lumen power injectable port catheter. Catheter tip overlies the upper atrium. Low inspiratory volumes. Increased pulmonary vascular congestion bordering on mild edema. Probable bibasilar atelectasis. Nonspecific foci of patchy airspace opacity in the bilateral upper lungs. Embolization coils noted in the left upper quadrant, likely within the splenic artery. Surgical changes of prior right reverse shoulder arthroplasty. IMPRESSION: 1. Low lung volumes with increased pulmonary vascular congestion bordering on mild edema. At least mild CHF is favored. 2. Nonspecific patchy airspace opacities in both upper lungs. Differential considerations include multifocal pneumonia versus more confluent areas of pulmonary edema. 3. Well-positioned port catheter. Electronically Signed   By: Malachy Moan M.D.   On: 04/29/2023 15:17   CT Head Wo Contrast  Result Date: 04/29/2023 CLINICAL DATA:  Altered mental status, unknown cause EXAM: CT HEAD WITHOUT CONTRAST TECHNIQUE: Contiguous axial images were obtained from the base of the skull through the vertex without intravenous contrast. RADIATION DOSE REDUCTION: This exam was performed according to the departmental dose-optimization program which includes automated exposure control, adjustment of the mA and/or kV according to patient size and/or use of iterative reconstruction technique. COMPARISON:  03/25/2023 FINDINGS: Brain: No evidence of acute infarction, hemorrhage, parenchyma mass, mass effect, or midline shift. No hydrocephalus or extra-axial fluid collection. Redemonstrated calcified meningioma along the right temporal convexity, which measures up to 2.3 x 1.3 x 1.8 cm (AP x TR x CC), with unchanged mass effect on the  right temporal lobe. Vascular: No hyperdense vessel. Skull: Negative for fracture or focal lesion. Sinuses/Orbits: Right sphenoid mucous retention cyst. Mild mucosal thickening in the ethmoid  air cells. Status post bilateral lens replacements. Other: Trace fluid in the right mastoid air cells. IMPRESSION: 1. No acute intracranial process. 2. Unchanged calcified meningioma along the right temporal convexity. Electronically Signed   By: Wiliam Ke M.D.   On: 04/29/2023 12:25    SIGNED: Kendell Bane, MD, FHM. FAAFP. [facility] - [facility]ist Time spent - 45 min.  In seeing, evaluating and examining the patient. Reviewing medical records, labs, drawn plan of care. [facility],  Pager (please use [email or web address] to page/ text) Please use Epic Secure Chat for non-urgent communication (7AM-7PM)  If 7PM-7AM, please contact night-coverage [email or web address], 04/30/2023, 12:12 PM

## 2023-04-30 NOTE — Plan of Care (Signed)
  Problem: Acute Rehab OT Goals (only OT should resolve) Goal: Pt. Will Perform Grooming Flowsheets (Taken 04/30/2023 1222) Pt Will Perform Grooming:  with supervision  standing Goal: Pt. Will Perform Lower Body Dressing Flowsheets (Taken 04/30/2023 1222) Pt Will Perform Lower Body Dressing:  with supervision  sitting/lateral leans  sit to/from stand Goal: Pt. Will Transfer To Toilet Flowsheets (Taken 04/30/2023 1222) Pt Will Transfer to Toilet:  with supervision  stand pivot transfer  ambulating  regular height toilet  bedside commode Goal: Pt. Will Perform Toileting-Clothing Manipulation Flowsheets (Taken 04/30/2023 1222) Pt Will Perform Toileting - Clothing Manipulation and hygiene:  with supervision  sitting/lateral leans  sit to/from stand Goal: Pt/Caregiver Will Perform Home Exercise Program Flowsheets (Taken 04/30/2023 1222) Pt/caregiver will Perform Home Exercise Program:  Increased strength  Both right and left upper extremity  With Supervision  With written HEP provided

## 2023-04-30 NOTE — Plan of Care (Signed)
  Problem: Acute Rehab PT Goals(only PT should resolve) Goal: Pt Will Go Supine/Side To Sit Outcome: Progressing Flowsheets (Taken 04/30/2023 1215) Pt will go Supine/Side to Sit:  with modified independence  with supervision Goal: Patient Will Transfer Sit To/From Stand Outcome: Progressing Flowsheets (Taken 04/30/2023 1215) Patient will transfer sit to/from stand: with contact guard assist Goal: Pt Will Transfer Bed To Chair/Chair To Bed Outcome: Progressing Flowsheets (Taken 04/30/2023 1215) Pt will Transfer Bed to Chair/Chair to Bed: with contact guard assist Goal: Pt Will Ambulate Outcome: Progressing Flowsheets (Taken 04/30/2023 1215) Pt will Ambulate:  50 feet  with contact guard assist  with rolling walker   12:15 PM, 04/30/23 Ocie Bob, MPT Physical Therapist with [facility] [phone] office [phone] mobile phone

## 2023-04-30 NOTE — [place] FL2 (Signed)
[place] MEDICAID FL2 LEVEL OF CARE FORM     IDENTIFICATION  Patient Name: Rachel Huber Birthdate: 1955/11/22 Sex: female Admission Date (Current Location): 04/29/2023  [facility] and [place] Number:  [employer] and Address:  [facility],  [address], [place] [address]      Provider Number: [id number]  Attending Physician Name and Address:  Kendell Bane, MD  Relative Name and Phone Number:  Addielynn, Kleinberg (Spouse)  [phone]    Current Level of Care: Hospital Recommended Level of Care: Skilled Nursing Facility Prior Approval Number:    Date Approved/Denied:   PASRR Number: [id number]  Discharge Plan:      Current Diagnoses: Patient Active Problem List   Diagnosis Date Noted   AMS (altered mental status) 04/29/2023   Hepatic encephalopathy (HCC) 03/25/2023   Acute metabolic encephalopathy 03/25/2023   Acute cystitis with hematuria 03/25/2023   Acute hepatic encephalopathy (HCC) 01/19/2023   Pancytopenia (HCC) 01/06/2023   Anemia 01/06/2023   Internal hemorrhoid 01/06/2023   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   Symptomatic anemia 12/17/2022   Hypokalemia 11/06/2022   Acute on chronic anemia 11/03/2022   Hypotension 11/03/2022   AKI (acute kidney injury) (HCC) 10/15/2022   Rectal bleeding 06/17/2022   Obesity (BMI 30-39.9) 06/17/2022   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 mellitus (HCC) 06/17/2022   Grade IV internal hemorrhoids 05/16/2022   Thrombocytopenia (HCC) 03/05/2022   Anemia in chronic kidney disease (CKD) 02/07/2022   GI bleeding 02/04/2022   Chronic kidney disease, stage 3b (HCC) 02/04/2022   Depression with anxiety 02/04/2022   Liver cirrhosis secondary to NASH (HCC)    Insomnia 01/27/2022   Iron deficiency anemia due to chronic blood loss 02/13/2021   History of uterine cancer 02/13/2021   Decompensated hepatic cirrhosis (HCC) 10/25/2020   OSA on CPAP 10/25/2020   Acute respiratory failure with hypoxia (HCC)  09/10/2020   Closed right hip fracture (HCC) 09/02/2020   Type 2 diabetes mellitus with hyperlipidemia (HCC) 09/02/2020   Thalassemia minor    HLD (hyperlipidemia)    Acute kidney injury superimposed on CKD (HCC)    Depression     Orientation RESPIRATION BLADDER Height & Weight     Self  Normal External catheter Weight: 101.1 kg Height:  5\' 9"  (175.3 cm)  BEHAVIORAL SYMPTOMS/MOOD NEUROLOGICAL BOWEL NUTRITION STATUS      Continent Diet (See DC summary)  AMBULATORY STATUS COMMUNICATION OF NEEDS Skin   Extensive Assist Verbally Normal                       Personal Care Assistance Level of Assistance  Bathing, Feeding, Dressing Bathing Assistance: Maximum assistance Feeding assistance: Limited assistance Dressing Assistance: Maximum assistance     Functional Limitations Info  Sight, Hearing, Speech Sight Info: Impaired Hearing Info: Adequate Speech Info: Adequate    SPECIAL CARE FACTORS FREQUENCY  PT (By licensed PT)     PT Frequency: 5 times a week              Contractures Contractures Info: Not present    Additional Factors Info  Code Status, Allergies Code Status Info: Full Allergies Info: latex, tape, graminease pllens           Current Medications (04/30/2023):  This is the current hospital active medication list Current Facility-Administered Medications  Medication Dose Route Frequency Provider Last Rate Last Admin   azithromycin (ZITHROMAX) tablet 500 mg  500 mg Oral Daily Elgergawy, Mliss Fritz  S, MD   500 mg at 04/30/23 0843   cefTRIAXone (ROCEPHIN) 2 g in sodium chloride 0.9 % 100 mL IVPB  2 g Intravenous Q24H Elgergawy, Leana Roe, MD 200 mL/hr at 04/29/23 1854 2 g at 04/29/23 1854   Chlorhexidine Gluconate Cloth 2 % PADS 6 each  6 each Topical Daily Elgergawy, Leana Roe, MD   6 each at 04/30/23 0843   cholecalciferol (VITAMIN D3) 25 MCG (1000 UNIT) tablet 1,000 Units  1,000 Units Oral Daily Elgergawy, Leana Roe, MD   1,000 Units at 04/30/23 0843    cyanocobalamin (VITAMIN B12) tablet 1,000 mcg  1,000 mcg Oral Daily Elgergawy, Leana Roe, MD   1,000 mcg at 04/30/23 0843   heparin injection 5,000 Units  5,000 Units Subcutaneous Q8H Elgergawy, Leana Roe, MD       lactulose (CHRONULAC) 10 GM/15ML solution 30 g  30 g Oral TID PC & HS Elgergawy, Leana Roe, MD   30 g at 04/30/23 1251   multivitamin with minerals tablet 1 tablet  1 tablet Oral Daily Elgergawy, Leana Roe, MD   1 tablet at 04/30/23 0843   rifaximin (XIFAXAN) tablet 550 mg  550 mg Oral BID Elgergawy, Leana Roe, MD   550 mg at 04/30/23 0843   Vitamin D (Ergocalciferol) (DRISDOL) 1.25 MG (50000 UNIT) capsule 50,000 Units  50,000 Units Oral Weekly Elgergawy, Leana Roe, MD   50,000 Units at 04/29/23 6644     Discharge Medications: Please see discharge summary for a list of discharge medications.  Relevant Imaging Results:  Relevant Lab Results:   Additional Information SS#[id number]  Leitha Bleak, RN

## 2023-04-30 NOTE — TOC Initial Note (Addendum)
Transition of Care [facility]) - Initial/Assessment Note    Patient Details  Name: Rachel Huber MRN: [id number] Date of Birth: 07/24/1956  Transition of Care [facility]) CM/SW Contact:    Leitha Bleak, RN Phone Number: 04/30/2023, 12:35 PM  Clinical Narrative:         Patient admitted with altered mental status, with a high risk for readmission. CM spoke with her husband. He stated she normally walks with a cane. She has declined this week. PT is recommending SNF. [name] is on his way to see her to evaluate for SNF vs HH. TOC following.          Addendum: CM at the bedside with patient, she is confused. He states he can not handle her at home unless she greatly improved. He is agreeable to sent out FL2 for bed offers and monitor for her improvements. FL2 completed and sent out. TOC following.   Barriers to Discharge: Continued Medical Work up   Patient Goals and CMS Choice Patient states their goals for this hospitalization and ongoing recovery are:: to get better CMS Medicare.gov Compare Post Acute Care list provided to:: Patient Represenative (must comment) Choice offered to / list presented to : Spouse [place] ownership interest in [facility].provided to:: Spouse    Expected Discharge Plan and Services     Living arrangements for the past 2 months: Single Family Home       Prior Living Arrangements/Services Living arrangements for the past 2 months: Single Family Home Lives with:: Spouse Patient language and need for interpreter reviewed:: Yes        Need for Family Participation in Patient Care: Yes (Comment) Care giver support system in place?: Yes (comment)   Criminal Activity/Legal Involvement Pertinent to Current Situation/Hospitalization: No - Comment as needed  Activities of Daily Living       Emotional Assessment      Orientation: : Oriented to Self Alcohol / Substance Use: Not Applicable Psych Involvement: No (comment)  Admission diagnosis:  Hepatic  encephalopathy (HCC) [K76.82] Community acquired pneumonia, unspecified laterality [J18.9] AMS (altered mental status) [R41.82] Patient Active Problem List   Diagnosis Date Noted   AMS (altered mental status) 04/29/2023   Hepatic encephalopathy (HCC) 03/25/2023   Acute metabolic encephalopathy 03/25/2023   Acute cystitis with hematuria 03/25/2023   Acute hepatic encephalopathy (HCC) 01/19/2023   Pancytopenia (HCC) 01/06/2023   Anemia 01/06/2023   Internal hemorrhoid 01/06/2023   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   Symptomatic anemia 12/17/2022   Hypokalemia 11/06/2022   Acute on chronic anemia 11/03/2022   Hypotension 11/03/2022   AKI (acute kidney injury) (HCC) 10/15/2022   Rectal bleeding 06/17/2022   Obesity (BMI 30-39.9) 06/17/2022   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 mellitus (HCC) 06/17/2022   Grade IV internal hemorrhoids 05/16/2022   Thrombocytopenia (HCC) 03/05/2022   Anemia in chronic kidney disease (CKD) 02/07/2022   GI bleeding 02/04/2022   Chronic kidney disease, stage 3b (HCC) 02/04/2022   Depression with anxiety 02/04/2022   Liver cirrhosis secondary to NASH (HCC)    Insomnia 01/27/2022   Iron deficiency anemia due to chronic blood loss 02/13/2021   History of uterine cancer 02/13/2021   Decompensated hepatic cirrhosis (HCC) 10/25/2020   OSA on CPAP 10/25/2020   Acute respiratory failure with hypoxia (HCC) 09/10/2020   Closed right hip fracture (HCC) 09/02/2020   Type 2 diabetes mellitus with hyperlipidemia (HCC) 09/02/2020   Thalassemia minor    HLD (hyperlipidemia)    Acute kidney  injury superimposed on CKD [facility])    Depression    PCP:  Lauro Regulus, MD Pharmacy:   [facility] [address] [place] - [address] [place] [address] Phone: [phone] Fax: [phone]    Social Determinants of Health (SDOH) Social History: SDOH Screenings   Food Insecurity: Patient Declined (03/25/2023)  Housing: Patient Declined  (03/25/2023)  Transportation Needs: Patient Declined (03/25/2023)  Utilities: Patient Declined (03/25/2023)  Alcohol Screen: Low Risk  (04/04/2022)  Depression (PHQ2-9): Low Risk  (04/27/2023)  Financial Resource Strain: Low Risk  (04/04/2022)  Physical Activity: Inactive (04/04/2022)  Social Connections: Moderately Isolated (04/04/2022)  Stress: No Stress Concern Present (04/04/2022)  Tobacco Use: Low Risk  (04/29/2023)   SDOH Interventions:     Readmission Risk Interventions    04/30/2023   12:35 PM 12/19/2022   10:27 AM 11/04/2022   11:32 AM  Readmission Risk Prevention Plan  Transportation Screening Complete Complete Complete  Medication Review Oceanographer) Complete Complete Complete  PCP or Specialist appointment within 3-5 days of discharge Not Complete Complete Complete  HRI or Home Care Consult Complete Complete   SW Recovery Care/Counseling Consult Complete Complete Complete  Palliative Care Screening Not Applicable Not Applicable Not Applicable  Skilled Nursing Facility Not Complete Not Applicable Not Applicable

## 2023-04-30 NOTE — Evaluation (Signed)
Occupational Therapy Evaluation Patient Details Name: Rachel Huber MRN: [id number] DOB: 1956/04/22 Today's Date: 04/30/2023   History of Present Illness Rachel Huber  is a 67 y.o. 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  -Patient was brought today secondary to altered mental status, she is confused unable to provide any history, it was obtained from husband by phone, ED physician, apparently she has not been taking her lactulose for last 2 days, had some nausea and vomiting yesterday, she had 1-2 loose stools,, her son-in-law apparently had what sounds like a viral gastroenteritis, she started to develop some of the symptoms yesterday.   Clinical Impression   Pt lethargic but agreeable to PT/OT evaluation. Pt with limited engagement, answering "yeah" to most questions. History obtained from chart review. Pt with limited participation in ADLs, currently unable to complete any standing tasks or tasks requiring mobility due to fatigue and lethargy. Recommend continued skilled OT services to further determine current functional status and improve safety and independence in ADLs and functional mobility.        If plan is discharge home, recommend the following: A lot of help with walking and/or transfers;A lot of help with bathing/dressing/bathroom;Assistance with cooking/housework;Direct supervision/assist for medications management;Supervision due to cognitive status    Functional Status Assessment  Patient has had a recent decline in their functional status and demonstrates the ability to make  significant improvements in function in a reasonable and predictable amount of time.  Equipment Recommendations  None recommended by OT       Precautions / Restrictions Precautions Precautions: Fall Restrictions Weight Bearing Restrictions: No      Mobility Bed Mobility Overal bed mobility: Needs Assistance Bed Mobility: Sit to Supine       Sit to supine: Contact guard assist   General bed mobility comments: increased time, labored movement    Transfers Overall transfer level: Needs assistance Equipment used: Rolling walker (2 wheels) Transfers: Sit to/from Stand, Bed to chair/wheelchair/BSC Sit to Stand: Min assist, Mod assist     Step pivot transfers: Mod assist     General transfer comment: required repeated verbal/tactile cueing with fair/poor carryover for follow instructions during transfer to chair          ADL either performed or assessed with clinical judgement   ADL Overall ADL's : Needs assistance/impaired Eating/Feeding: Set up;Sitting;Bed level   Grooming: Set up;Sitting   Upper Body Bathing: Moderate assistance   Lower Body Bathing: Moderate assistance   Upper Body Dressing : Minimal assistance   Lower Body Dressing: Moderate assistance   Toilet Transfer: Moderate assistance           Functional mobility during ADLs: Moderate assistance General ADL Comments: Pt unable to follow commands to accurately determine current level of functioning. Pt lethargic, requiring mod assist to transfer to chair, multiple cues to engage.     Vision Baseline Vision/History: 1 Wears glasses Additional Comments: unable to assess            Pertinent Vitals/Pain Pain Assessment Pain Assessment: No/denies pain     Extremity/Trunk Assessment Upper Extremity Assessment Upper Extremity Assessment: Generalized weakness   Lower Extremity Assessment Lower Extremity  Assessment: Defer to PT evaluation   Cervical / Trunk Assessment Cervical / Trunk  Assessment: Normal   Communication Communication Communication: Difficulty following commands/understanding Following commands: Follows one step commands inconsistently Cueing Techniques: Verbal cues;Tactile cues   Cognition Arousal: Alert, Lethargic Behavior During Therapy: WFL for tasks assessed/performed Overall Cognitive Status: No family/caregiver present to determine baseline cognitive functioning                                 General Comments: slightly lethargic, appears slightly confused                Home Living Family/patient expects to be discharged to:: Private residence Living Arrangements: Spouse/significant other Available Help at Discharge: Family;Available 24 hours/day Type of Home: House Home Access: Stairs to enter [employer] of Steps: 1 + 1 front door Entrance Stairs-Rails: None Home Layout: One level     Bathroom Shower/Tub: Tub/shower unit;Curtain   Bathroom Toilet: Handicapped height Bathroom Accessibility: Yes   Home Equipment: Agricultural consultant (2 wheels);Rollator (4 wheels);Cane - single point;Tub bench;Hand held shower head;Grab bars - tub/shower;Grab bars - toilet          Prior Functioning/Environment Prior Level of Function : Needs assist       Physical Assist : Mobility (physical);ADLs (physical) Mobility (physical): Stairs;Bed mobility;Transfers;Gait ADLs (physical): Bathing;Grooming Mobility Comments: Risk analyst RW ADLs Comments: Assisted by family        OT Problem List: Decreased strength;Decreased activity tolerance;Impaired balance (sitting and/or standing);Decreased safety awareness;Decreased knowledge of use of DME or AE;Impaired UE functional use      OT Treatment/Interventions: Self-care/ADL training;Therapeutic exercise;DME and/or AE instruction;Therapeutic activities;Patient/family education    OT Goals(Current goals can be found in the care plan section) Acute Rehab OT  Goals Patient Stated Goal: None stated OT Goal Formulation: With patient Time For Goal Achievement: 05/14/23 Potential to Achieve Goals: Good  OT Frequency: Min 1X/week    Co-evaluation PT/OT/SLP Co-Evaluation/Treatment: Yes Reason for Co-Treatment: To address functional/ADL transfers PT goals addressed during session: Mobility/safety with mobility;Balance;Proper use of DME OT goals addressed during session: ADL's and self-care      AM-PAC OT "6 Clicks" Daily Activity     Outcome Measure Help from another person eating meals?: A Little Help from another person taking care of personal grooming?: A Lot Help from another person toileting, which includes using toliet, bedpan, or urinal?: A Lot Help from another person bathing (including washing, rinsing, drying)?: A Lot Help from another person to put on and taking off regular upper body clothing?: A Lot Help from another person to put on and taking off regular lower body clothing?: A Lot 6 Click Score: 13   End of Session Equipment Utilized During Treatment: Gait belt;Rolling walker (2 wheels)  Activity Tolerance: Patient limited by lethargy Patient left: in chair;with call bell/phone within reach;with chair alarm set  OT Visit Diagnosis: Muscle weakness (generalized) (M62.81)                Time: 8469-6295 OT Time Calculation (min): 15 min Charges:  OT General Charges $OT Visit: 1 Visit OT Evaluation $OT Eval Low Complexity: 1 Low  Ezra Sites, OTR/L  [phone] 04/30/2023, 12:20 PM

## 2023-04-30 NOTE — Evaluation (Signed)
Physical Therapy Evaluation Patient Details Name: Rachel Huber MRN: [id number] DOB: [birth year] Today's Date: 04/30/2023  History of Present Illness  Rachel Huber  is a 67 y.o. 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  -Patient was brought today secondary to altered mental status, she is confused unable to provide any history, it was obtained from husband by phone, ED physician, apparently she has not been taking her lactulose for last 2 days, had some nausea and vomiting yesterday, she had 1-2 loose stools,, her son-in-law apparently had what sounds like a viral gastroenteritis, she started to develop some of the symptoms yesterday.   Clinical Impression  Patient demonstrates slow labored movement for sitting up at bedside, required repeated verbal/tactile cueing for completing transfer to chair possibly due to mild confusion and limited to a few steps at bedside before having to sit due to fatigue.  Patient tolerated sitting up in chair after therapy - nursing staff notified.  Patient will benefit from continued skilled physical therapy in hospital and recommended venue below to increase strength, balance, endurance for safe ADLs and gait.       If plan is discharge home, recommend the following: A lot of help with bathing/dressing/bathroom;A lot of help with walking and/or transfers;Help with stairs or ramp for entrance;Assistance with cooking/housework   Can travel by private vehicle   No    Equipment Recommendations None recommended by PT  Recommendations for Other Services        Functional Status Assessment Patient has had a recent decline in their functional status and demonstrates the ability to make significant improvements in function in a reasonable and predictable amount of time.     Precautions / Restrictions Precautions Precautions: Fall Restrictions Weight Bearing Restrictions: No      Mobility  Bed Mobility Overal bed mobility: Needs Assistance Bed Mobility: Sit to Supine       Sit to supine: Contact guard assist   General bed mobility comments: increased time, labored movement    Transfers Overall transfer level: Needs assistance Equipment used: Rolling walker (2 wheels) Transfers: Sit to/from Stand, Bed to chair/wheelchair/BSC Sit to Stand: Min assist, Mod assist   Step pivot transfers: Mod assist       General transfer comment: required repeated verbal/tactile cueing with fair/poor carryover for follow instructions during transfer to chair    Ambulation/Gait Ambulation/Gait assistance: Mod assist Gait Distance (Feet): 10 Feet Assistive device: Rolling walker (2 wheels) Gait Pattern/deviations: Decreased step length - right, Decreased step length - left, Decreased stride length Gait velocity: slow     General Gait Details: limited to taking a few steps at bedside due to fatigue and generalized weakness  Stairs            Wheelchair Mobility     Tilt Bed    Modified Rankin (Stroke Patients Only)       Balance Overall balance assessment: Needs assistance Sitting-balance support: Feet supported, No upper extremity supported Sitting balance-Leahy Scale: Fair Sitting balance - Comments: fair/good seated at EOB   Standing balance support: Reliant on assistive device for balance, During functional activity, Bilateral upper extremity supported Standing balance-Leahy Scale: Poor Standing  balance comment: fair/poor using RW                             Pertinent Vitals/Pain Pain Assessment Pain  Assessment: No/denies pain    Home Living Family/patient expects to be discharged to:: Private residence Living Arrangements: Spouse/significant other Available Help at Discharge: Family;Available 24 hours/day Type of Home: House Home Access: Stairs to enter Entrance Stairs-Rails: None Entrance Stairs-Number of Steps: 1 + 1 front door   Home Layout: One level Home Equipment: Agricultural consultant (2 wheels);Rollator (4 wheels);Cane - single point;Tub bench;Hand held shower head;Grab bars - tub/shower;Grab bars - toilet      Prior Function Prior Level of Function : Needs assist       Physical Assist : Mobility (physical);ADLs (physical) Mobility (physical): Stairs;Bed mobility;Transfers;Gait   Mobility Comments: Household ambulator using RW ADLs Comments: Assisted by family     Extremity/Trunk Assessment   Upper Extremity Assessment Upper Extremity Assessment: Defer to OT evaluation    Lower Extremity Assessment Lower Extremity Assessment: Generalized weakness    Cervical / Trunk Assessment Cervical / Trunk Assessment: Normal  Communication   Communication Communication: Difficulty following commands/understanding Following commands: Follows one step commands inconsistently Cueing Techniques: Verbal cues;Tactile cues  Cognition Arousal: Alert, Lethargic Behavior During Therapy: WFL for tasks assessed/performed Overall Cognitive Status: No family/caregiver present to determine baseline cognitive functioning                                 General Comments: slightly lethargic, appears slightly confused        General Comments      Exercises     Assessment/Plan    PT Assessment Patient needs continued PT services  PT Problem List Decreased strength;Decreased activity tolerance;Decreased balance;Decreased mobility       PT Treatment Interventions DME instruction;Gait training;Stair training;Functional mobility training;Therapeutic  activities;Therapeutic exercise;Balance training;Patient/family education    PT Goals (Current goals can be found in the Care Plan section)  Acute Rehab PT Goals Patient Stated Goal: return home with family to assist PT Goal Formulation: With patient Time For Goal Achievement: 05/14/23 Potential to Achieve Goals: Good    Frequency Min 3X/week     Co-evaluation PT/OT/SLP Co-Evaluation/Treatment: Yes Reason for Co-Treatment: To address functional/ADL transfers PT goals addressed during session: Mobility/safety with mobility;Balance;Proper use of DME         AM-PAC PT "6 Clicks" Mobility  Outcome Measure Help needed turning from your back to your side while in a flat bed without using bedrails?: A Little Help needed moving from lying on your back to sitting on the side of a flat bed without using bedrails?: A Little Help needed moving to and from a bed to a chair (including a wheelchair)?: A Lot Help needed standing up from a chair using your arms (e.g., wheelchair or bedside chair)?: A Lot Help needed to walk in hospital room?: A Lot Help needed climbing 3-5 steps with a railing? : A Lot 6 Click Score: 14    End of Session   Activity Tolerance: Patient tolerated treatment well;Patient limited by fatigue Patient left: in chair;with call bell/phone within reach;with chair alarm set Nurse Communication: Mobility status PT Visit Diagnosis: Unsteadiness on feet (R26.81);Other abnormalities of gait and mobility (R26.89);Muscle weakness (generalized) (M62.81)    Time: 8657-8469 PT Time Calculation (min) (ACUTE ONLY): 14 min   Charges:   PT Evaluation $PT  Eval Low Complexity: 1 Low PT Treatments $Therapeutic Activity: 8-22 mins PT General Charges $$ ACUTE PT VISIT: 1 Visit         12:14 PM, 04/30/23 Ocie Bob, MPT Physical Therapist with [facility] [phone] office [phone] mobile phone

## 2023-05-01 DIAGNOSIS — J189 Pneumonia, unspecified organism: Secondary | ICD-10-CM

## 2023-05-01 DIAGNOSIS — R4182 Altered mental status, unspecified: Secondary | ICD-10-CM | POA: Diagnosis not present

## 2023-05-01 DIAGNOSIS — K7682 Hepatic encephalopathy: Secondary | ICD-10-CM | POA: Diagnosis not present

## 2023-05-01 LAB — COMPREHENSIVE METABOLIC PANEL
ALT: 26 U/L (ref 0–44)
AST: 44 U/L — ABNORMAL HIGH (ref 15–41)
Albumin: 2.8 g/dL — ABNORMAL LOW (ref 3.5–5.0)
Alkaline Phosphatase: 88 U/L (ref 38–126)
Anion gap: 13 (ref 5–15)
BUN: 53 mg/dL — ABNORMAL HIGH (ref 8–23)
CO2: 18 mmol/L — ABNORMAL LOW (ref 22–32)
Calcium: 9.2 mg/dL (ref 8.9–10.3)
Chloride: 112 mmol/L — ABNORMAL HIGH (ref 98–111)
Creatinine, Ser: 1.74 mg/dL — ABNORMAL HIGH (ref 0.44–1.00)
GFR, Estimated: 32 mL/min — ABNORMAL LOW (ref >60)
Glucose, Bld: 192 mg/dL — ABNORMAL HIGH (ref 70–99)
Potassium: 3 mmol/L — ABNORMAL LOW (ref 3.5–5.1)
Sodium: 143 mmol/L (ref 135–145)
Total Bilirubin: 2.1 mg/dL — ABNORMAL HIGH (ref 0.3–1.2)
Total Protein: 6.4 g/dL — ABNORMAL LOW (ref 6.5–8.1)

## 2023-05-01 LAB — CBC
HCT: 27.2 % — ABNORMAL LOW (ref 36.0–46.0)
Hemoglobin: 8.5 g/dL — ABNORMAL LOW (ref 12.0–15.0)
MCH: 23.3 pg — ABNORMAL LOW (ref 26.0–34.0)
MCHC: 31.3 g/dL (ref 30.0–36.0)
MCV: 74.5 fL — ABNORMAL LOW (ref 80.0–100.0)
Platelets: 174 10*3/uL (ref 150–400)
RBC: 3.65 MIL/uL — ABNORMAL LOW (ref 3.87–5.11)
RDW: 23.3 % — ABNORMAL HIGH (ref 11.5–15.5)
WBC: 11 10*3/uL — ABNORMAL HIGH (ref 4.0–10.5)
nRBC: 0 % (ref 0.0–0.2)

## 2023-05-01 LAB — AMMONIA: Ammonia: 61 umol/L — ABNORMAL HIGH (ref 9–35)

## 2023-05-01 MED ORDER — POTASSIUM CHLORIDE 2 MEQ/ML IV SOLN
INTRAVENOUS | Status: AC
  Filled 2023-05-01: qty 1000

## 2023-05-01 MED ORDER — LACTULOSE ENEMA
300.0000 mL | Freq: Every day | ORAL | Status: DC
  Filled 2023-05-01 (×4): qty 300

## 2023-05-01 MED ORDER — SODIUM CHLORIDE 0.9 % IV SOLN: 12.5000 mg | Freq: Four times a day (QID) | INTRAVENOUS | Status: DC | PRN

## 2023-05-01 MED ORDER — SODIUM CHLORIDE 0.9 % IV SOLN: 25.0000 mg | Freq: Four times a day (QID) | INTRAVENOUS | Status: DC | PRN

## 2023-05-01 MED ORDER — MAGNESIUM SULFATE 2 GM/50ML IV SOLN
2.0000 g | Freq: Once | INTRAVENOUS | Status: AC
  Administered 2023-05-01: 2 g via INTRAVENOUS
  Filled 2023-05-01: qty 50

## 2023-05-01 NOTE — TOC Progression Note (Signed)
Transition of Care [facility]) - Progression Note    Patient Details  Name: Rachel Huber MRN: [id number] Date of Birth: 1955-11-02  Transition [facility]) CM/SW Contact  Leitha Bleak, RN Phone Number: 05/01/2023, 11:20 AM  Clinical Narrative:   Discussed bed offers with Husband and patient. They want [facility]. Should be medically ready tomorrow. TOC confirmed she could admit over the weekend. Per Lynnea Ferrier they need DC summary on Saturday for weekend admission. MD updated.    Expected Discharge Plan: Skilled Nursing Facility Barriers to Discharge: Continued Medical Work up  Expected Discharge Plan and Services      Living arrangements for the past 2 months: Single Family Home                   Social Determinants of Health (SDOH) Interventions SDOH Screenings   Food Insecurity: Patient Declined (03/25/2023)  Housing: Patient Declined (03/25/2023)  Transportation Needs: Patient Declined (03/25/2023)  Utilities: Patient Declined (03/25/2023)  Alcohol Screen: Low Risk  (04/04/2022)  Depression (PHQ2-9): Low Risk  (04/27/2023)  Financial Resource Strain: Low Risk  (04/04/2022)  Physical Activity: Inactive (04/04/2022)  Social Connections: Moderately Isolated (04/04/2022)  Stress: No Stress Concern Present (04/04/2022)  Tobacco Use: Low Risk  (04/29/2023)    Readmission Risk Interventions    04/30/2023   12:35 PM 12/19/2022   10:27 AM 11/04/2022   11:32 AM  Readmission Risk Prevention Plan  Transportation Screening Complete Complete Complete  Medication Review Oceanographer) Complete Complete Complete  PCP or Specialist appointment within 3-5 days of discharge Not Complete Complete Complete  HRI or Home Care Consult Complete Complete   SW Recovery Care/Counseling Consult Complete Complete Complete  Palliative Care Screening Not Applicable Not Applicable Not Applicable  Skilled Nursing Facility Not Complete Not Applicable Not Applicable

## 2023-05-01 NOTE — Care Management Important Message (Signed)
Important Message  Patient Details  Name: Rachel Huber MRN: [id number] Date of Birth: 27-Dec-1955   Medicare Important Message Given:  Yes     Corey Harold 05/01/2023, 10:55 AM

## 2023-05-01 NOTE — Plan of Care (Signed)
  Problem: Clinical Measurements: Goal: Diagnostic test results will improve Outcome: Progressing Goal: Signs and symptoms of infection will decrease Outcome: Progressing   Problem: Nutrition: Goal: Adequate nutrition will be maintained Outcome: Not Progressing - Pt ate very little lunch, questioned the food on her plate at dinner although it was what she ordered. All eating was done with a lot of encouragement from staff.

## 2023-05-01 NOTE — Progress Notes (Signed)
PROGRESS NOTE    Patient: Rachel Huber                            PCP: Lauro Regulus, MD                    DOB: 1956/01/09            DOA: 04/29/2023 VHQ:[id number]             DOS: 05/01/2023, 10:46 AM   LOS: 2 days   Date of Service: The patient was seen and examined on 05/01/2023  Subjective:   The patient was seen and examined this morning, much more awake alert, able to communicate follow commands. Hemodynamically stable  Brief Narrative:   Rachel Huber  is a 67 y.o. 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 -Patient was brought today secondary to altered mental status, she is confused unable to provide any history, it was obtained from husband by phone, ED physician, apparently she has not been taking her lactulose for last 2 days, had some nausea and vomiting yesterday, she had 1-2 loose stools,, her son-in-law apparently had what sounds like a viral gastroenteritis, she started to develop some of the symptoms yesterday.  -in ED she was noted to be febrile 100.2,COVID was negative, chest x-ray concerning for pneumonia, creatinine at baseline 1.62, glucose elevated at 198, ammonia elevated at 68, hemoglobin stable at 8.1, platelets low at 146, treated for pneumonia, and received 1 dose of lactulose, and [facility] consulted to admit.    Assessment & Plan:     Principal Problem:   AMS (altered mental status) Active Problems:   Liver cirrhosis secondary to NASH (HCC)   HLD (hyperlipidemia)   OSA on CPAP   Obesity (BMI 30-39.9)   CKD stage 3 due to type 2  diabetes mellitus (HCC)       AMS- Acute metabolic encephalopathy. Acute hepatic encephalopathy.   -Less confused, much more awake alert, able to communicate, follow commands -Confusion due to hyperammonemia, acute hepatic encephalopathy Also may be contributed by infection possible pneumonia  -Continue to hold Lexapro and gabapentin  -As discussed with husband, she has not been compliant with her lactulose for last 2 days,  - Ammonia level 68 >>72 >>> 61    -Will encourage p.o. lactulose 30 g 4 times daily -Will monitor ammonia level -Will treat her pneumonia   Hyperammonemia -Please see above discussion -Continue with rifaximin   Pneumonia -She is febrile 100.2, UA is negative, COVID is negative. -Chest x-ray concerning for pneumonia, will start on IV Rocephin and azithromycin, will check sputum culture, Legionella and strep pneumonia antigen   Cirrhosis due to NASH -Will hold her torsemide and Aldactone due to acute illness, will resume once oral intake is reliable -See above discussion regarding lactulose -Continue with rifaximin   Microcytic anemia Anemia of chronic kidney disease -Hemoglobin at 8.1, transfuse as needed   CKD stage IIIb -Baseline creatinine 1.3-1.6 Lab Results  Component Value Date   CREATININE 1.74 (H) 05/01/2023   CREATININE 1.48 (H) 04/30/2023   CREATININE 1.62 (H)  04/29/2023    -Monitor renal function   Mild dehydration/hyponatremia -Initiate gentle IV fluid hydration LR 1 20Kcl  type 2 DM with hyperglycemia Diet Controlled.   Depression with anxiety -Home Lexapro and stable   Thrombocytopenia -this is secondary to liver disease, monitoring platelet counts, continue to monitor, SCD for DVT prophylaxis       DVT Prophylaxis SCD for now, hold on pharmacologic DVT prophylaxis given history of anemia requiring frequent PRBC  transfusion     ----------------------------------------------------------------------------------------------------------------------------------------------- Nutritional status:  The patient's BMI is: Body mass index is 32.91 kg/m. I agree with the assessment and plan as outlined -----------------------------------------------------------------------------------------------------------------------  DVT prophylaxis:  heparin injection 5,000 Units Start: 04/30/23 2200 SCDs Start: 04/29/23 1836   Code Status:   Code Status: Full Code  Family Communication: No family member present at bedside- attempt will be made to update daily -Advance care planning has been discussed.   Admission status:   Status is: Inpatient Remains inpatient appropriate because: Needing IV fluids, intolerant to p.o. medication including lactulose needing enema lactulose-IV antibiotics   Disposition: From  - home             Planning for discharge in 1-2 days: to SNF   Procedures:   No admission procedures for hospital encounter.   Antimicrobials:  Anti-infectives (From admission, onward)    Start     Dose/Rate Route Frequency Ordered Stop   04/29/23 2200  rifaximin (XIFAXAN) tablet 550 mg        550 mg Oral 2 times daily 04/29/23 1835     04/29/23 1715  cefTRIAXone (ROCEPHIN) 2 g in sodium chloride 0.9 % 100 mL IVPB        2 g 200 mL/hr over 30 Minutes Intravenous Every 24 hours 04/29/23 1709 05/04/23 1714   04/29/23 1715  azithromycin (ZITHROMAX) tablet 500 mg        500 mg Oral Daily 04/29/23 1709 05/04/23 0959        Medication:   azithromycin  500 mg Oral Daily   Chlorhexidine Gluconate Cloth  6 each Topical Daily   cholecalciferol  1,000 Units Oral Daily   cyanocobalamin  1,000 mcg Oral Daily   heparin  5,000 Units Subcutaneous Q8H   lactulose  30 g Oral TID PC & HS   multivitamin with minerals  1 tablet Oral Daily   rifaximin  550 mg Oral BID   Vitamin D (Ergocalciferol)  50,000  Units Oral Weekly    HYDROmorphone (DILAUDID) injection, oxyCODONE, promethazine (PHENERGAN) injection (IM or IVPB)   Objective:   Vitals:   04/30/23 0520 04/30/23 1244 04/30/23 2105 05/01/23 0431  BP: (!) 138/53 (!) 119/45 (!) 149/55 (!) 129/52  Pulse: 85 80 (!) 107 90  Resp:  20 20 20   Temp: 97.7 F (36.5 C) 98.4 F (36.9 C) 98.7 F (37.1 C) 100 F (37.8 C)  TempSrc: Oral Oral  Oral  SpO2: 97% 98% 92% 94%  Weight:      Height:        Intake/Output Summary (Last 24 hours) at 05/01/2023 1046 Last data filed at 05/01/2023 0300 Gross per 24 hour  Intake 390 ml  Output 850 ml  Net -460 ml   Filed Weights   04/29/23 0847 04/29/23 1839  Weight: 104.3 kg 101.1 kg     Physical examination:        General:  Much more awake and cooperative today AAO x 2, no distress;   HEENT:  Normocephalic, PERRL, otherwise with in Normal  limits   Neuro:  CNII-XII intact. , normal motor and sensation, reflexes intact   Lungs:   Clear to auscultation BL, Respirations unlabored,  No wheezes / crackles  Cardio:    S1/S2, RRR, No murmure, No Rubs or Gallops   Abdomen:  Soft, non-tender, bowel sounds active all four quadrants, no guarding or peritoneal signs.  Muscular  skeletal:  Limited exam -global generalized weaknesses - in bed, able to move all 4 extremities,   2+ pulses,  symmetric, No pitting edema  Skin:  Dry, warm to touch, negative for any Rashes,  Wounds: Please see nursing documentation          ------------------------------------------------------------------------------------------------------------------------------------------    LABs:     Latest Ref Rng & Units 05/01/2023    4:39 AM 04/30/2023    4:23 AM 04/29/2023    9:22 AM  CBC  WBC 4.0 - 10.5 K/uL 11.0  6.0  5.5   Hemoglobin 12.0 - 15.0 g/dL 8.5  8.7  8.1   Hematocrit 36.0 - 46.0 % 27.2  28.8  25.4   Platelets 150 - 400 K/uL 174  155  146       Latest Ref Rng & Units 05/01/2023    4:39 AM 04/30/2023     4:23 AM 04/29/2023    9:22 AM  CMP  Glucose 70 - 99 mg/dL 782  956  213   BUN 8 - 23 mg/dL 53  46  50   Creatinine 0.44 - 1.00 mg/dL 0.86  5.78  4.69   Sodium 135 - 145 mmol/L 143  136  134   Potassium 3.5 - 5.1 mmol/L 3.0  3.3  3.9   Chloride 98 - 111 mmol/L 112  106  102   CO2 22 - 32 mmol/L 18  16  22    Calcium 8.9 - 10.3 mg/dL 9.2  9.0  8.7   Total Protein 6.5 - 8.1 g/dL 6.4  6.3  6.1   Total Bilirubin 0.3 - 1.2 mg/dL 2.1  2.0  2.8   Alkaline Phos 38 - 126 U/L 88  96  104   AST 15 - 41 U/L 44  47  53   ALT 0 - 44 U/L 26  26  30         Micro Results Recent Results (from the past 240 hour(s))  Resp panel by RT-PCR (RSV, Flu A&B, Covid) Urine, In & Out Cath     Status: None   Collection Time: 04/29/23  3:33 PM   Specimen: Urine, In & Out Cath; Nasal Swab  Result Value Ref Range Status   SARS Coronavirus 2 by RT PCR NEGATIVE NEGATIVE Final    Comment: (NOTE) SARS-CoV-2 target nucleic acids are NOT DETECTED.  The SARS-CoV-2 RNA is generally detectable in upper respiratory specimens during the acute phase of infection. The lowest concentration of SARS-CoV-2 viral copies this assay can detect is 138 copies/mL. A negative result does not preclude SARS-Cov-2 infection and should not be used as the sole basis for treatment or other patient management decisions. A negative result may occur with  improper specimen collection/handling, submission of specimen other than nasopharyngeal swab, presence of viral mutation(s) within the areas targeted by this assay, and inadequate number of viral copies(<138 copies/mL). A negative result must be combined with clinical observations, patient history, and epidemiological information. The expected result is Negative.  Fact Sheet for Patients:  BloggerCourse.com  Fact Sheet for Healthcare Providers:  SeriousBroker.it  This test is no t yet approved  or cleared by the [place]  and  has been authorized for detection and/or diagnosis of SARS-CoV-2 by FDA under an Emergency Use Authorization (EUA). This EUA will remain  in effect (meaning this test can be used) for the duration of the COVID-19 declaration under Section 564(b)(1) of the Act, 21 U.S.C.section 360bbb-3(b)(1), unless the authorization is terminated  or revoked sooner.       Influenza A by PCR NEGATIVE NEGATIVE Final   Influenza B by PCR NEGATIVE NEGATIVE Final    Comment: (NOTE) The Xpert Xpress SARS-CoV-2/FLU/RSV plus assay is intended as an aid in the diagnosis of influenza from Nasopharyngeal swab specimens and should not be used as a sole basis for treatment. Nasal washings and aspirates are unacceptable for Xpert Xpress SARS-CoV-2/FLU/RSV testing.  Fact Sheet for Patients: BloggerCourse.com  Fact Sheet for Healthcare Providers: SeriousBroker.it  This test is not yet approved or cleared by the [place] FDA and has been authorized for detection and/or diagnosis of SARS-CoV-2 by FDA under an Emergency Use Authorization (EUA). This EUA will remain in effect (meaning this test can be used) for the duration of the COVID-19 declaration under Section 564(b)(1) of the Act, 21 U.S.C. section 360bbb-3(b)(1), unless the authorization is terminated or revoked.     Resp Syncytial Virus by PCR NEGATIVE NEGATIVE Final    Comment: (NOTE) Fact Sheet for Patients: BloggerCourse.com  Fact Sheet for Healthcare Providers: SeriousBroker.it  This test is not yet approved or cleared by the [place] FDA and has been authorized for detection and/or diagnosis of SARS-CoV-2 by FDA under an Emergency Use Authorization (EUA). This EUA will remain in effect (meaning this test can be used) for the duration of the COVID-19 declaration under Section 564(b)(1) of the Act, 21 U.S.C. section 360bbb-3(b)(1),  unless the authorization is terminated or revoked.  Performed at [facility], [address] [place], [place] [address]     Radiology Reports No results found.  SIGNED: Kendell Bane, MD, FHM. FAAFP. [facility] - [facility] Time spent - 55 min.  In seeing, evaluating and examining the patient. Reviewing medical records, labs, drawn plan of care. [facility],  Pager (please use [email or web address] to page/ text) Please use Epic Secure Chat for non-urgent communication (7AM-7PM)  If 7PM-7AM, please contact night-coverage [email or web address], 05/01/2023, 10:46 AM

## 2023-05-01 NOTE — Progress Notes (Signed)
Physical Therapy Treatment Patient Details Name: TURQUOISE BATTISTA MRN: [id number] DOB: [birth year] Today's Date: 05/01/2023   History of Present Illness Becca Vlasic  is a 67 y.o. 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  -Patient was brought today secondary to altered mental status, she is confused unable to provide any history, it was obtained from husband by phone, ED physician, apparently she has not been taking her lactulose for last 2 days, had some nausea and vomiting yesterday, she had 1-2 loose stools,, her son-in-law apparently had what sounds like a viral gastroenteritis, she started to develop some of the symptoms yesterday.    PT Comments  Pt slightly confused, able to recall name and DOB but unable to recall where she is at initially, MD stopped by later on in session and pt able to recall then.  Pt limited by weakness with increased time to complete bed mobility and transfer training.  Pt with need for bowel movement, transferred to [facility] with cueing for hand placement and to stand with RW for safety.  Short duration gait training to chair following clean up, slow labored movements.  EOS pt left in chair with call bell within reach, chair alarm set and RN in room.      If plan is discharge home, recommend the following:     Can travel by private vehicle        Equipment Recommendations       Recommendations for Other Services       Precautions / Restrictions Precautions Precautions: Fall Restrictions Weight Bearing Restrictions: No     Mobility  Bed Mobility Overal bed  mobility: Independent Bed Mobility: Sit to Supine       Sit to supine: Contact guard assist   General bed mobility comments: increased time, labored movement    Transfers Overall transfer level: Needs assistance Equipment used: Rolling walker (2 wheels) Transfers: Sit to/from Stand, Bed to chair/wheelchair/BSC Sit to Stand: Min assist, Mod assist           General transfer comment: required repeated verbal/tactile cueing with fair/poor carryover for follow instructions during transfer to chair    Ambulation/Gait Ambulation/Gait assistance: Mod assist Gait Distance (Feet): 10 Feet Assistive device: Rolling walker (2 wheels) Gait Pattern/deviations: Decreased step length - right, Decreased step length - left, Decreased stride length Gait velocity: slow     General Gait Details: limited to taking a few steps at bedside due to fatigue and generalized weakness   Stairs             Wheelchair Mobility     Tilt Bed    Modified Rankin (Stroke Patients Only)       Balance                                            Cognition Arousal: Alert Behavior During Therapy: WFL for tasks assessed/performed Overall Cognitive Status: No family/caregiver present to determine baseline cognitive functioning  General Comments: slightly lethargic, appears slightly confused.  Able to recall name and DOB, unsure where she is        Exercises      General Comments        Pertinent Vitals/Pain Pain Assessment Pain Assessment: 0-10 Pain Score: 4  Pain Location: back pain Pain Descriptors / Indicators: Discomfort Pain Intervention(s): Monitored during session, Limited activity within patient's tolerance, Repositioned    Home Living                          Prior Function            PT Goals (current goals can now be found in the care plan section)      Frequency           PT  Plan      Co-evaluation              AM-PAC PT "6 Clicks" Mobility   Outcome Measure  Help needed turning from your back to your side while in a flat bed without using bedrails?: A Little Help needed moving from lying on your back to sitting on the side of a flat bed without using bedrails?: A Little Help needed moving to and from a bed to a chair (including a wheelchair)?: A Lot Help needed standing up from a chair using your arms (e.g., wheelchair or bedside chair)?: A Lot Help needed to walk in hospital room?: A Lot Help needed climbing 3-5 steps with a railing? : A Lot 6 Click Score: 14    End of Session Equipment Utilized During Treatment:  (RW, SBA) Activity Tolerance: Patient tolerated treatment well;Patient limited by fatigue Patient left: in chair;with call bell/phone within reach;with chair alarm set;with nursing/sitter in room Nurse Communication: Mobility status PT Visit Diagnosis: Unsteadiness on feet (R26.81);Other abnormalities of gait and mobility (R26.89);Muscle weakness (generalized) (M62.81)     Time: 1610-9604 PT Time Calculation (min) (ACUTE ONLY): 45 min  Charges:    $Therapeutic Activity: 38-52 mins PT General Charges $$ ACUTE PT VISIT: 1 Visit                     Becky Sax, LPTA/CLT; CBIS [phone]  Juel Burrow 05/01/2023, 10:28 AM

## 2023-05-02 ENCOUNTER — Inpatient Hospital Stay (HOSPITAL_COMMUNITY): Payer: Medicare Other

## 2023-05-02 DIAGNOSIS — R4182 Altered mental status, unspecified: Secondary | ICD-10-CM | POA: Diagnosis not present

## 2023-05-02 LAB — COMPREHENSIVE METABOLIC PANEL
ALT: 24 U/L (ref 0–44)
AST: 36 U/L (ref 15–41)
Albumin: 2.7 g/dL — ABNORMAL LOW (ref 3.5–5.0)
Alkaline Phosphatase: 77 U/L (ref 38–126)
Anion gap: 10 (ref 5–15)
BUN: 47 mg/dL — ABNORMAL HIGH (ref 8–23)
CO2: 19 mmol/L — ABNORMAL LOW (ref 22–32)
Calcium: 9.1 mg/dL (ref 8.9–10.3)
Chloride: 112 mmol/L — ABNORMAL HIGH (ref 98–111)
Creatinine, Ser: 1.35 mg/dL — ABNORMAL HIGH (ref 0.44–1.00)
GFR, Estimated: 44 mL/min — ABNORMAL LOW (ref >60)
Glucose, Bld: 144 mg/dL — ABNORMAL HIGH (ref 70–99)
Potassium: 3.6 mmol/L (ref 3.5–5.1)
Sodium: 141 mmol/L (ref 135–145)
Total Bilirubin: 2.2 mg/dL — ABNORMAL HIGH (ref 0.3–1.2)
Total Protein: 6 g/dL — ABNORMAL LOW (ref 6.5–8.1)

## 2023-05-02 LAB — CBC
HCT: 24.1 % — ABNORMAL LOW (ref 36.0–46.0)
Hemoglobin: 7.5 g/dL — ABNORMAL LOW (ref 12.0–15.0)
MCH: 23.6 pg — ABNORMAL LOW (ref 26.0–34.0)
MCHC: 31.1 g/dL (ref 30.0–36.0)
MCV: 75.8 fL — ABNORMAL LOW (ref 80.0–100.0)
Platelets: 156 10*3/uL (ref 150–400)
RBC: 3.18 MIL/uL — ABNORMAL LOW (ref 3.87–5.11)
RDW: 23.5 % — ABNORMAL HIGH (ref 11.5–15.5)
WBC: 6.3 10*3/uL (ref 4.0–10.5)
nRBC: 0 % (ref 0.0–0.2)

## 2023-05-02 LAB — AMMONIA: Ammonia: 26 umol/L (ref 9–35)

## 2023-05-02 NOTE — Progress Notes (Signed)
PROGRESS NOTE    Patient: Rachel Huber                            PCP: Lauro Regulus, MD                    DOB: [birth year]            DOA: 04/29/2023 [id number]             DOS: 05/02/2023, 12:48 PM   LOS: 3 days   Date of Service: The patient was seen and examined on 05/02/2023  Subjective:   The patient was seen and examined this morning more awake alert, Her speech was noted to be slow today, mild asymmetric weaknesses Changes was not noted by nursing staff actually improved mentation was noted She has been covering from hepatic encephalopathy    Brief Narrative:   Ane Method  is a 67 y.o. 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 -Patient was brought today secondary to altered mental status, she is confused unable to provide any history, it was obtained from husband by phone, ED physician, apparently she has not been taking her lactulose for last 2 days, had some nausea and vomiting yesterday, she had 1-2 loose stools,, her son-in-law apparently had what sounds like a viral gastroenteritis, she started to develop some of the symptoms yesterday.  -in ED she was noted to be febrile 100.2,COVID was negative, chest x-ray concerning for pneumonia, creatinine at baseline 1.62, glucose elevated at 198, ammonia elevated at 68, hemoglobin stable at 8.1, platelets low at 146, treated for pneumonia, and received 1 dose of lactulose, and [facility] consulted to admit.    Assessment & Plan:     Principal Problem:   AMS (altered mental status) Active Problems:   Liver  cirrhosis secondary to NASH (HCC)   HLD (hyperlipidemia)   OSA on CPAP   Obesity (BMI 30-39.9)   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 mellitus (HCC)       AMS- Acute metabolic encephalopathy. Acute hepatic encephalopathy.   -still confused, but  much more awake alert, able to communicate, follow commands ?  Slow speech and asymmetric weaknesses Will obtain MRI to rule out acute stroke otherwise likely due to hyperammonemia level and infection  -Heart rate has been patient is close to baseline but mentation   -Confusion due to hyperammonemia, acute hepatic encephalopathy Also may be contributed by infection possible pneumonia  -Continue to hold Lexapro and gabapentin  -As discussed with husband, she has not been compliant with her lactulose for last 2 days,  - Ammonia level 68 >>72 >>> 61 >>26    -Will encourage p.o. lactulose 30 g 4 times daily -Will monitor ammonia level -Will treat her pneumonia   Hyperammonemia -Please see above discussion -Continue with rifaximin   Pneumonia Xarelto stable, afebrile, normotensive, POA: febrile 100.2, UA is negative, COVID is negative. -Chest x-ray concerning for pneumonia, will start on IV Rocephin and azithromycin,  -Cultures remain negative, with no growth   Cirrhosis due to NASH -Anticipating resuming torsemide and Aldactone As  her p.o. intake improves -Continue p.o. lactulose -Continue with rifaximin   Microcytic anemia Anemia of chronic kidney disease -Hemoglobin remained stable, monitoring   CKD stage IIIb -Baseline creatinine 1.3-1.6 Lab Results  Component Value Date   CREATININE 1.74 (H) 05/01/2023   CREATININE 1.48 (H) 04/30/2023   CREATININE 1.62 (H) 04/29/2023    -Monitor renal function   Mild dehydration/hyponatremia -Initiate gentle IV fluid hydration LR 1 20Kcl  type 2 DM with hyperglycemia Diet Controlled. -CBG q. ACHS   Depression with anxiety -Home Lexapro -on  hold  Thrombocytopenia -Monitoring -this is secondary to liver disease, monitoring platelet counts, continue to monitor, SCD for DVT prophylaxis       DVT Prophylaxis SCD for now, hold on pharmacologic DVT prophylaxis given history of anemia requiring frequent PRBC transfusion     ----------------------------------------------------------------------------------------------------------------------------------------------- Nutritional status:  The patient's BMI is: Body mass index is 32.91 kg/m. I agree with the assessment and plan as outlined -----------------------------------------------------------------------------------------------------------------------  DVT prophylaxis:  heparin injection 5,000 Units Start: 04/30/23 2200 SCDs Start: 04/29/23 1836   Code Status:   Code Status: Full Code  Family Communication: Husband at bedside updated -Advance care planning has been discussed.   Admission status:   Status is: Inpatient Remains inpatient appropriate because: Needing IV fluids, intolerant to p.o. medication including lactulose needing enema lactulose-IV antibiotics   Disposition: From  - home             Planning for discharge in 1-2 days: to SNF   Procedures:   No admission procedures for hospital encounter.   Antimicrobials:  Anti-infectives (From admission, onward)    Start     Dose/Rate Route Frequency Ordered Stop   04/29/23 2200  rifaximin (XIFAXAN) tablet 550 mg        550 mg Oral 2 times daily 04/29/23 1835     04/29/23 1715  cefTRIAXone (ROCEPHIN) 2 g in sodium chloride 0.9 % 100 mL IVPB        2 g 200 mL/hr over 30 Minutes Intravenous Every 24 hours 04/29/23 1709 05/04/23 1714   04/29/23 1715  azithromycin (ZITHROMAX) tablet 500 mg        500 mg Oral Daily 04/29/23 1709 05/04/23 0959        Medication:   azithromycin  500 mg Oral Daily   Chlorhexidine Gluconate Cloth  6 each Topical Daily   cholecalciferol  1,000 Units Oral Daily    cyanocobalamin  1,000 mcg Oral Daily   heparin  5,000 Units Subcutaneous Q8H   lactulose  30 g Oral TID PC & HS   multivitamin with minerals  1 tablet Oral Daily   rifaximin  550 mg Oral BID   Vitamin D (Ergocalciferol)  50,000 Units Oral Weekly    HYDROmorphone (DILAUDID) injection, oxyCODONE, promethazine (PHENERGAN) injection (IM or IVPB)   Objective:   Vitals:   05/01/23 1350 05/01/23 1931 05/01/23 2053 05/02/23 0401  BP: (!) 123/45  (!) 124/45 (!) 128/56  Pulse: 74  75 75  Resp: 16 (!) 22 20 18   Temp: 98.4 F (36.9 C)  98.7 F (37.1 C) 97.9 F (36.6 C)  TempSrc: Oral   Oral  SpO2: 98%  98% 97%  Weight:      Height:        Intake/Output Summary (Last 24 hours) at 05/02/2023 1248 Last data filed at 05/02/2023 0837 Gross per 24 hour  Intake 520 ml  Output 900 ml  Net -380 ml   Filed Weights   04/29/23 0847  04/29/23 1839  Weight: 104.3 kg 101.1 kg     Physical examination:      ------------------------------------------------------------------------------------------------------------------------------------------    LABs:     Latest Ref Rng & Units 05/02/2023    4:19 AM 05/01/2023    4:39 AM 04/30/2023    4:23 AM  CBC  WBC 4.0 - 10.5 K/uL 6.3  11.0  6.0   Hemoglobin 12.0 - 15.0 g/dL 7.5  8.5  8.7   Hematocrit 36.0 - 46.0 % 24.1  27.2  28.8   Platelets 150 - 400 K/uL 156  174  155       Latest Ref Rng & Units 05/02/2023    4:19 AM 05/01/2023    4:39 AM 04/30/2023    4:23 AM  CMP  Glucose 70 - 99 mg/dL 161  096  045   BUN 8 - 23 mg/dL 47  53  46   Creatinine 0.44 - 1.00 mg/dL 4.09  8.11  9.14   Sodium 135 - 145 mmol/L 141  143  136   Potassium 3.5 - 5.1 mmol/L 3.6  3.0  3.3   Chloride 98 - 111 mmol/L 112  112  106   CO2 22 - 32 mmol/L 19  18  16    Calcium 8.9 - 10.3 mg/dL 9.1  9.2  9.0   Total Protein 6.5 - 8.1 g/dL 6.0  6.4  6.3   Total Bilirubin 0.3 - 1.2 mg/dL 2.2  2.1  2.0   Alkaline Phos 38 - 126 U/L 77  88  96   AST 15 - 41 U/L 36  44  47    ALT 0 - 44 U/L 24  26  26         Micro Results Recent Results (from the past 240 hour(s))  Resp panel by RT-PCR (RSV, Flu A&B, Covid) Urine, In & Out Cath     Status: None   Collection Time: 04/29/23  3:33 PM   Specimen: Urine, In & Out Cath; Nasal Swab  Result Value Ref Range Status   SARS Coronavirus 2 by RT PCR NEGATIVE NEGATIVE Final    Comment: (NOTE) SARS-CoV-2 target nucleic acids are NOT DETECTED.  The SARS-CoV-2 RNA is generally detectable in upper respiratory specimens during the acute phase of infection. The lowest concentration of SARS-CoV-2 viral copies this assay can detect is 138 copies/mL. A negative result does not preclude SARS-Cov-2 infection and should not be used as the sole basis for treatment or other patient management decisions. A negative result may occur with  improper specimen collection/handling, submission of specimen other than nasopharyngeal swab, presence of viral mutation(s) within the areas targeted by this assay, and inadequate number of viral copies(<138 copies/mL). A negative result must be combined with clinical observations, patient history, and epidemiological information. The expected result is Negative.  Fact Sheet for Patients:  BloggerCourse.com  Fact Sheet for Healthcare Providers:  SeriousBroker.it  This test is no t yet approved or cleared by the [place] FDA and  has been authorized for detection and/or diagnosis of SARS-CoV-2 by FDA under an Emergency Use Authorization (EUA). This EUA will remain  in effect (meaning this test can be used) for the duration of the COVID-19 declaration under Section 564(b)(1) of the Act, 21 U.S.C.section 360bbb-3(b)(1), unless the authorization is terminated  or revoked sooner.       Influenza A by PCR NEGATIVE NEGATIVE Final   Influenza B by PCR NEGATIVE NEGATIVE Final    Comment: (NOTE) The Xpert Xpress SARS-CoV-2/FLU/RSV  plus assay  is intended as an aid in the diagnosis of influenza from Nasopharyngeal swab specimens and should not be used as a sole basis for treatment. Nasal washings and aspirates are unacceptable for Xpert Xpress SARS-CoV-2/FLU/RSV testing.  Fact Sheet for Patients: BloggerCourse.com  Fact Sheet for Healthcare Providers: SeriousBroker.it  This test is not yet approved or cleared by the [place] FDA and has been authorized for detection and/or diagnosis of SARS-CoV-2 by FDA under an Emergency Use Authorization (EUA). This EUA will remain in effect (meaning this test can be used) for the duration of the COVID-19 declaration under Section 564(b)(1) of the Act, 21 U.S.C. section 360bbb-3(b)(1), unless the authorization is terminated or revoked.     Resp Syncytial Virus by PCR NEGATIVE NEGATIVE Final    Comment: (NOTE) Fact Sheet for Patients: BloggerCourse.com  Fact Sheet for Healthcare Providers: SeriousBroker.it  This test is not yet approved or cleared by the [place] FDA and has been authorized for detection and/or diagnosis of SARS-CoV-2 by FDA under an Emergency Use Authorization (EUA). This EUA will remain in effect (meaning this test can be used) for the duration of the COVID-19 declaration under Section 564(b)(1) of the Act, 21 U.S.C. section 360bbb-3(b)(1), unless the authorization is terminated or revoked.  Performed at [facility], [address] [place], [place] [address]     Radiology Reports No results found.  SIGNED: Kendell Bane, MD, FHM. FAAFP. [facility] - [facility]ist Time spent - 55 min.  In seeing, evaluating and examining the patient. Reviewing medical records, labs, drawn plan of care. [facility],  Pager (please use [email or web address] to page/ text) Please use Epic Secure Chat for non-urgent communication (7AM-7PM)  If 7PM-7AM, please  contact night-coverage [email or web address], 05/02/2023, 12:48 PM

## 2023-05-02 NOTE — Progress Notes (Signed)
Patient has been oriented to person and place only this shift, slow speech pattern.She will follow commands and is able to move all extremities. Noted weaker grip left hand and slight drift left leg. This was reported to physician.

## 2023-05-03 DIAGNOSIS — D569 Thalassemia, unspecified: Secondary | ICD-10-CM

## 2023-05-03 DIAGNOSIS — R4182 Altered mental status, unspecified: Secondary | ICD-10-CM | POA: Diagnosis not present

## 2023-05-03 DIAGNOSIS — K7682 Hepatic encephalopathy: Secondary | ICD-10-CM | POA: Diagnosis not present

## 2023-05-03 LAB — CBC
HCT: 23.7 % — ABNORMAL LOW (ref 36.0–46.0)
Hemoglobin: 7.1 g/dL — ABNORMAL LOW (ref 12.0–15.0)
MCH: 23.1 pg — ABNORMAL LOW (ref 26.0–34.0)
MCHC: 30 g/dL (ref 30.0–36.0)
MCV: 76.9 fL — ABNORMAL LOW (ref 80.0–100.0)
Platelets: 168 10*3/uL (ref 150–400)
RBC: 3.08 MIL/uL — ABNORMAL LOW (ref 3.87–5.11)
RDW: 23.1 % — ABNORMAL HIGH (ref 11.5–15.5)
WBC: 6.1 10*3/uL (ref 4.0–10.5)
nRBC: 0 % (ref 0.0–0.2)

## 2023-05-03 LAB — COMPREHENSIVE METABOLIC PANEL
ALT: 24 U/L (ref 0–44)
AST: 34 U/L (ref 15–41)
Albumin: 2.7 g/dL — ABNORMAL LOW (ref 3.5–5.0)
Alkaline Phosphatase: 76 U/L (ref 38–126)
Anion gap: 6 (ref 5–15)
BUN: 45 mg/dL — ABNORMAL HIGH (ref 8–23)
CO2: 21 mmol/L — ABNORMAL LOW (ref 22–32)
Calcium: 8.9 mg/dL (ref 8.9–10.3)
Chloride: 116 mmol/L — ABNORMAL HIGH (ref 98–111)
Creatinine, Ser: 1.25 mg/dL — ABNORMAL HIGH (ref 0.44–1.00)
GFR, Estimated: 48 mL/min — ABNORMAL LOW (ref >60)
Glucose, Bld: 139 mg/dL — ABNORMAL HIGH (ref 70–99)
Potassium: 3.7 mmol/L (ref 3.5–5.1)
Sodium: 143 mmol/L (ref 135–145)
Total Bilirubin: 1.7 mg/dL — ABNORMAL HIGH (ref 0.3–1.2)
Total Protein: 5.8 g/dL — ABNORMAL LOW (ref 6.5–8.1)

## 2023-05-03 LAB — AMMONIA: Ammonia: 27 umol/L (ref 9–35)

## 2023-05-03 LAB — HEMOGLOBIN AND HEMATOCRIT, BLOOD
HCT: 23.6 % — ABNORMAL LOW (ref 36.0–46.0)
Hemoglobin: 7.2 g/dL — ABNORMAL LOW (ref 12.0–15.0)

## 2023-05-03 MED ORDER — ACETAMINOPHEN 325 MG PO TABS: 650.0000 mg | ORAL_TABLET | Freq: Two times a day (BID) | ORAL | Status: DC | PRN

## 2023-05-03 MED ORDER — SERTRALINE HCL 50 MG PO TABS
50.0000 mg | ORAL_TABLET | Freq: Every day | ORAL | Status: DC
  Administered 2023-05-03 – 2023-05-04 (×2): 50 mg via ORAL
  Filled 2023-05-03 (×2): qty 1

## 2023-05-03 MED ORDER — DICLOFENAC SODIUM 1 % EX GEL
4.0000 g | Freq: Four times a day (QID) | CUTANEOUS | Status: DC
  Administered 2023-05-03 – 2023-05-04 (×6): 4 g via TOPICAL
  Filled 2023-05-03: qty 100

## 2023-05-03 MED ORDER — TORSEMIDE 20 MG PO TABS
40.0000 mg | ORAL_TABLET | Freq: Every day | ORAL | Status: DC
  Administered 2023-05-03 – 2023-05-04 (×2): 40 mg via ORAL
  Filled 2023-05-03 (×2): qty 2

## 2023-05-03 MED ORDER — ACETAMINOPHEN 325 MG PO TABS: 650.0000 mg | ORAL_TABLET | Freq: Three times a day (TID) | ORAL | Status: DC | PRN

## 2023-05-03 NOTE — Progress Notes (Signed)
PROGRESS NOTE    Patient: Rachel Huber                            PCP: Lauro Regulus, MD                    DOB: 1956/01/26            DOA: 04/29/2023 [id number]             DOS: 05/03/2023, 12:57 PM   LOS: 4 days   Date of Service: The patient was seen and examined on 05/03/2023  Subjective:   The patient was seen and examined, much more awake alert oriented, following command, Planing of joints pain in her hands with history of arthritis.  Reportedly patient that her MRI was negative for acute stroke  Per husband mentation is at baseline  Patient and her husband are agreeable to disposition to SNF in a.m.    Brief Narrative:   Rachel Huber  is a 67 y.o. 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 -Patient was brought today secondary to altered mental status, she is confused unable to provide any history, it was obtained from husband by phone, ED physician, apparently she has not been taking her lactulose for last 2 days, had some nausea and vomiting yesterday, she had 1-2 loose stools,, her son-in-law apparently had what sounds like a viral gastroenteritis, she started to develop some of the symptoms yesterday.  -in ED she was noted to be febrile 100.2,COVID was negative, chest x-ray concerning for pneumonia, creatinine at baseline 1.62, glucose elevated at 198, ammonia elevated at 68, hemoglobin stable at 8.1, platelets low at 146, treated for pneumonia, and received 1 dose of lactulose, and [facility] hospitalist consulted to admit.    Assessment & Plan:     Principal  Problem:   AMS (altered mental status) Active Problems:   Liver cirrhosis secondary to NASH (HCC)   HLD (hyperlipidemia)   OSA on CPAP   Obesity (BMI 30-39.9)   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 mellitus (HCC)       AMS- Acute metabolic encephalopathy. Acute hepatic encephalopathy. -Patient back to baseline, pleasantly confused, with slow speech in response  -MRI of the brain reported chronic meningioma, no acute stroke  -Per husband mentation is at baseline now     -Confusion due to hyperammonemia, acute hepatic encephalopathy Also may be contributed by infection possible pneumonia  -Continue to hold Lexapro and gabapentin  -As discussed with husband, she has not been compliant with her lactulose for last 2 days,  - Ammonia level 68 >>72 >>> 61 >>26    -Will encourage p.o. lactulose 30 g 4 times daily -Will monitor ammonia level -Will treat her pneumonia   Hyperammonemia -Please see above discussion -Will continue p.o. lactulose,And Rifaximin   Pneumonia Hemodynamically stable and POA: febrile 100.2, UA is negative, COVID is negative. -Chest x-ray concerning for pneumonia, >> on IV Rocephin and azithromycin,  -Cultures remain negative, with no growth    Cirrhosis due to NASH -Anticipating resuming torsemide and Aldactone As her p.o. intake  improves -Continue p.o. lactulose -Continue with rifaximin   Microcytic anemia Anemia of chronic kidney disease -Hemoglobin remained stable, monitoring   CKD stage IIIb -Baseline creatinine 1.3-1.6 Lab Results  Component Value Date   CREATININE 1.25 (H) 05/03/2023   CREATININE 1.35 (H) 05/02/2023   CREATININE 1.74 (H) 05/01/2023    -Monitor renal function   Mild dehydration/hyponatremia -Initiate gentle IV fluid hydration LR 1 20Kcl  Type 2 DM with hyperglycemia Diet Controlled. -CBG q. ACHS   Depression with anxiety -Home Lexapro -on hold  Anemia of chronic disease due to liver cirrhosis  -Monitor  H&H, dropping -Obtain Hemoccult -Transfuse if hemoglobin drops below 7 or become symptomatic     Latest Ref Rng & Units 05/03/2023    8:41 AM 05/03/2023    5:15 AM 05/02/2023    4:19 AM  CBC  WBC 4.0 - 10.5 K/uL  6.1  6.3   Hemoglobin 12.0 - 15.0 g/dL 7.2  7.1  7.5   Hematocrit 36.0 - 46.0 % 23.6  23.7  24.1   Platelets 150 - 400 K/uL  168  156        thrombocytopenia -Monitoring -this is secondary to liver disease, monitoring platelet counts, continue to monitor, SCD for DVT prophylaxis       DVT Prophylaxis SCD for now, hold on pharmacologic DVT prophylaxis given history of anemia requiring frequent PRBC transfusion     ----------------------------------------------------------------------------------------------------------------------------------------------- Nutritional status:  The patient's BMI is: Body mass index is 32.91 kg/m. I agree with the assessment and plan as outlined -----------------------------------------------------------------------------------------------------------------------  DVT prophylaxis:  heparin injection 5,000 Units Start: 04/30/23 2200 SCDs Start: 04/29/23 1836   Code Status:   Code Status: Full Code  Family Communication: Husband at bedside updated -Advance care planning has been discussed.   Admission status:   Status is: Inpatient Remains inpatient appropriate because: Needing IV fluids, intolerant to p.o. medication including lactulose needing enema lactulose-IV antibiotics   Disposition: From  - home             Planning for discharge in 1-2 days: to SNF   Procedures:   No admission procedures for hospital encounter.   Antimicrobials:  Anti-infectives (From admission, onward)    Start     Dose/Rate Route Frequency Ordered Stop   04/29/23 2200  rifaximin (XIFAXAN) tablet 550 mg        550 mg Oral 2 times daily 04/29/23 1835     04/29/23 1715  cefTRIAXone (ROCEPHIN) 2 g in sodium chloride 0.9 % 100 mL IVPB        2  g 200 mL/hr over 30 Minutes Intravenous Every 24 hours 04/29/23 1709 05/04/23 1714   04/29/23 1715  azithromycin (ZITHROMAX) tablet 500 mg        500 mg Oral Daily 04/29/23 1709 05/03/23 1059        Medication:   Chlorhexidine Gluconate Cloth  6 each Topical Daily   cholecalciferol  1,000 Units Oral Daily   cyanocobalamin  1,000 mcg Oral Daily   diclofenac Sodium  4 g Topical QID   heparin  5,000 Units Subcutaneous Q8H   lactulose  30 g Oral TID PC & HS   multivitamin with minerals  1 tablet Oral Daily   rifaximin  550 mg Oral BID   sertraline  50 mg Oral Daily   torsemide  40 mg Oral Daily   Vitamin D (Ergocalciferol)  50,000 Units Oral Weekly    acetaminophen, HYDROmorphone (DILAUDID) injection, oxyCODONE, promethazine (PHENERGAN) injection (IM or  IVPB)   Objective:   Vitals:   05/02/23 0401 05/02/23 1306 05/02/23 2232 05/03/23 0617  BP: (!) 128/56 (!) 134/53 (!) 124/50 (!) 114/53  Pulse: 75 82 69 69  Resp: 18  20 20   Temp: 97.9 F (36.6 C) 98.7 F (37.1 C) 100 F (37.8 C) 99.1 F (37.3 C)  TempSrc: Oral Oral Oral Oral  SpO2: 97% 94% 95% 96%  Weight:      Height:        Intake/Output Summary (Last 24 hours) at 05/03/2023 1257 Last data filed at 05/02/2023 1700 Gross per 24 hour  Intake 150 ml  Output --  Net 150 ml   Filed Weights   04/29/23 0847 04/29/23 1839  Weight: 104.3 kg 101.1 kg     Physical examination:        General:  AAO x 2,  cooperative, no distress;   HEENT:  Normocephalic, PERRL, otherwise with in Normal limits   Neuro:  Confused, speech is slow, otherwise CNII-XII intact. , normal motor and sensation, reflexes intact   Lungs:   Clear to auscultation BL, Respirations unlabored,  No wheezes / crackles  Cardio:    S1/S2, RRR, No murmure, No Rubs or Gallops   Abdomen:  Soft, non-tender, bowel sounds active all four quadrants, no guarding or peritoneal signs.  Muscular  skeletal:  Complaining of joint hand pain with limited range of  motion (history of arthritis ) Limited exam -global generalized weaknesses - in bed, able to move all 4 extremities,   2+ pulses,  symmetric, No pitting edema  Skin:  Dry, warm to touch, negative for any Rashes,  Wounds: Please see nursing documentation           ------------------------------------------------------------------------------------------------------------------------------------------    LABs:     Latest Ref Rng & Units 05/03/2023    8:41 AM 05/03/2023    5:15 AM 05/02/2023    4:19 AM  CBC  WBC 4.0 - 10.5 K/uL  6.1  6.3   Hemoglobin 12.0 - 15.0 g/dL 7.2  7.1  7.5   Hematocrit 36.0 - 46.0 % 23.6  23.7  24.1   Platelets 150 - 400 K/uL  168  156       Latest Ref Rng & Units 05/03/2023    5:15 AM 05/02/2023    4:19 AM 05/01/2023    4:39 AM  CMP  Glucose 70 - 99 mg/dL 811  914  782   BUN 8 - 23 mg/dL 45  47  53   Creatinine 0.44 - 1.00 mg/dL 9.56  2.13  0.86   Sodium 135 - 145 mmol/L 143  141  143   Potassium 3.5 - 5.1 mmol/L 3.7  3.6  3.0   Chloride 98 - 111 mmol/L 116  112  112   CO2 22 - 32 mmol/L 21  19  18    Calcium 8.9 - 10.3 mg/dL 8.9  9.1  9.2   Total Protein 6.5 - 8.1 g/dL 5.8  6.0  6.4   Total Bilirubin 0.3 - 1.2 mg/dL 1.7  2.2  2.1   Alkaline Phos 38 - 126 U/L 76  77  88   AST 15 - 41 U/L 34  36  44   ALT 0 - 44 U/L 24  24  26          Radiology Reports MR BRAIN WO CONTRAST  Result Date: 05/02/2023 CLINICAL DATA:  Altered mental status. EXAM: MRI HEAD WITHOUT CONTRAST TECHNIQUE: Multiplanar, multiecho pulse sequences of the  brain and surrounding structures were obtained without intravenous contrast. COMPARISON:  CT head without contrast 04/29/2023 and 09/21/2017 FINDINGS: Brain: A heavily calcified meningioma seen on prior head CTs in the right middle cranial fossa measures 2.2 x 1.5 cm. There is some mass effect on the lateral aspect of the right temporal tip without significant signal change. Mild atrophy and periventricular white matter changes  are within normal limits for age. White matter is otherwise within normal limits. Deep brain nuclei are within normal limits. The ventricles are of normal size. No significant extraaxial fluid collection is present. The brainstem and cerebellum are within normal limits. The internal auditory canals are within normal limits. Midline structures are within normal limits. Vascular: Flow is present in the major intracranial arteries. Skull and upper cervical spine: The craniocervical junction is normal. Upper cervical spine is within normal limits. Marrow signal is unremarkable. Sinuses/Orbits: A chronic polyp or mucous retention cyst is present in the right sphenoid sinus. Mild mucosal thickening is present the anterior ethmoid air cells. Right greater than left mastoid effusions are present. No obstructing nasopharyngeal lesion is present. Additional small polyps are present in the inferior left maxillary sinus. IMPRESSION: 1. 2.2 x 1.5 cm heavily calcified meningioma in the right middle cranial fossa with some mass effect on the lateral aspect of the right temporal tip without significant signal change. 2. Otherwise normal MRI appearance of the brain for age. No acute intracranial abnormality to explain altered mental status. 3. Right greater than left mastoid effusions. No obstructing nasopharyngeal lesion is present. 4. Scattered sinus disease as described. Electronically Signed   By: Marin Roberts M.D.   On: 05/02/2023 20:01    SIGNED: Kendell Bane, MD, FHM. FAAFP. [facility] - [facility] Time spent - 55 min.  In seeing, evaluating and examining the patient. Reviewing medical records, labs, drawn plan of care. [facility],  Pager (please use [email or web address] to page/ text) Please use Epic Secure Chat for non-urgent communication (7AM-7PM)  If 7PM-7AM, please contact night-coverage [email or web address], 05/03/2023, 12:57 PM

## 2023-05-03 NOTE — Consult Note (Addendum)
Katrinka Blazing, M.D. Gastroenterology & Hepatology                                           Patient Name: Rachel Huber Account #: @FLAACCTNO @   MRN: [id number] Admission Date: 04/29/2023 Date of Evaluation:  05/03/2023 Time of Evaluation: 11:36 AM   Referring Physician: Nevin Bloodgood, MD  Chief Complaint: Cirrhosis and anemia  HPI:  This is a 67 y.o. 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  The patient was found to have pneumonia for which she was started on antibiotic coverage.  Gastroenterology was consulted for evaluation of anemia and management of cirrhosis.  The patient presented acute onset of altered mental status and fever up to 100.2.  On admission chest x-ray showed changes concerning for pneumonia for which she was started on Rocephin and azithromycin.  Patient reports that she was feeling relatively well but was not taking her lactulose as she was supposed to be taking it.  She denies having any significant rectal bleeding recently.  In fact she saw Dr. Elby Showers on 03/30/2023 who recommended repeating abdominal imaging as her rectal bleeding and thrombocytopenia had significantly increased.  Gastroenterology was consulted as she presented encephalopathy during her current hospitalization.  Based on the notes from admission she was completely disoriented but this has improved.  Per nursing staff, she has not presented any rectal bleeding or melena during the current hospitalization.  Notably, her hemoglobin today was 7.2 with lowest value of 7.1 earlier.  In the morning.  Hemoglobin was 7.5 yesterday.  MCV  was 76.9.  The patient underwent a flexible sigmoidoscopy on 12/19/2022 that showed hemorrhoids and stool in the rectum and sigmoid but no presence of ongoing bleeding or stigmata of recent bleeding, presence of internal hemorrhoids as well.  Patient currently following with [facility] liver transplant team for potential liver transplant in the future.  She was recently prescribed Xifaxan for encephalopathy but she could not afford the medication.  It is unclear if she started this with the patient assistance program.  Of note, her initial ammonia 4 days ago was 68 but today was 27.  Past Medical History: SEE CHRONIC ISSSUES: Past Medical History:  Diagnosis Date   Anemia in chronic kidney disease (CKD) 02/07/2022   Anxiety    Arthritis    Ascites of liver    Weekly paracentesis   Cirrhosis of liver (HCC)    CKD (chronic kidney disease) stage 4, GFR 15-29 ml/min (HCC)    GERD (gastroesophageal reflux disease)    Grade IV internal hemorrhoids    Hypertension    NASH (nonalcoholic steatohepatitis)    Neuropathy, diabetic (HCC)    Obesity (BMI 30-39.9)    OSA on CPAP    Pneumonia    Portal hypertension (HCC)    S/P TIPS (transjugular intrahepatic portosystemic shunt)    Thalassemia minor 1992   Past Surgical History:  Past Surgical History:  Procedure Laterality Date   ABDOMINAL HYSTERECTOMY     BREAST BIOPSY Right 02/15/2018   [place] bx 6-6:30 ribbon shape, ONE  CORE FRAGMENT WITH FIBROSIS. ONE CORE FRAGMENT WITH PORTION OF A DILATED   BREAST BIOPSY Right 02/15/2018   [place] bx 9:00 heart shape, USUAL DUCTAL HYPERPLASIA   BREAST LUMPECTOMY Right 03/09/2018   Procedure: BREAST LUMPECTOMY x 2;  Surgeon: Sung Amabile, DO;  Location: [facility];  Service: General;  Laterality: Right;   CATARACT EXTRACTION W/PHACO Left 06/11/2016   Procedure: CATARACT EXTRACTION PHACO AND INTRAOCULAR LENS PLACEMENT (IOC);  Surgeon: Sallee Lange, MD;  Location: [facility];  Service: Ophthalmology;  Laterality: Left;   Lot # [id number] H US:01:38.6 AP%:26.4 CDE:44.15   CATARACT EXTRACTION W/PHACO Right 06/15/2018   Procedure: CATARACT EXTRACTION PHACO AND INTRAOCULAR LENS PLACEMENT (IOC);  Surgeon: Galen Manila, MD;  Location: [facility];  Service: Ophthalmology;  Laterality: Right;  [place] 00:38.2 CDE 4.23 Fluid Pack Lot # [id number]   COLONOSCOPIES     COLONOSCOPY WITH PROPOFOL N/A 10/10/2020   Procedure: COLONOSCOPY WITH PROPOFOL;  Surgeon: Regis Bill, MD;  Location: [facility];  Service: Endoscopy;  Laterality: N/A;   COLONOSCOPY WITH PROPOFOL N/A 11/20/2020   Procedure: COLONOSCOPY WITH PROPOFOL;  Surgeon: Regis Bill, MD;  Location: [facility] ENDOSCOPY;  Service: Endoscopy;  Laterality: N/A;  DM STAT CBC, BMP COVID POSITIVE 09/02/2020   COLONOSCOPY WITH PROPOFOL N/A 06/19/2022   Procedure: COLONOSCOPY WITH PROPOFOL;  Surgeon: Regis Bill, MD;  Location: [facility] ENDOSCOPY;  Service: Endoscopy;  Laterality: N/A;   CTR     ESOPHAGOGASTRODUODENOSCOPY (EGD) WITH PROPOFOL N/A 10/10/2020   Procedure: ESOPHAGOGASTRODUODENOSCOPY (EGD) WITH PROPOFOL;  Surgeon: Regis Bill, MD;  Location: [facility] ENDOSCOPY;  Service: Endoscopy;  Laterality: N/A;  COVID POSITIVE 10/08/2020   FLEXIBLE SIGMOIDOSCOPY N/A 02/06/2022   Procedure: FLEXIBLE SIGMOIDOSCOPY;  Surgeon: Regis Bill, MD;  Location: [facility] ENDOSCOPY;  Service: Endoscopy;  Laterality: N/A;  Patient requests anesthesia   FLEXIBLE SIGMOIDOSCOPY N/A 12/19/2022   Procedure: FLEXIBLE SIGMOIDOSCOPY;  Surgeon: Dolores Frame, MD;  Location: [facility];  Service: Gastroenterology;  Laterality: N/A;   IR ANGIOGRAM SELECTIVE EACH ADDITIONAL VESSEL  02/07/2022   IR ANGIOGRAM SELECTIVE EACH ADDITIONAL VESSEL  02/07/2022   IR ANGIOGRAM SELECTIVE EACH ADDITIONAL VESSEL  09/05/2022   IR ANGIOGRAM VISCERAL SELECTIVE  02/07/2022   IR ANGIOGRAM VISCERAL SELECTIVE  09/05/2022   IR CV LINE INJECTION  02/24/2023   IR EMBO ART  VEN HEMORR LYMPH EXTRAV   INC GUIDE ROADMAPPING  09/05/2022   IR EMBO ART  VEN HEMORR LYMPH EXTRAV  INC GUIDE ROADMAPPING  11/05/2022   IR EMBO ARTERIAL NOT HEMORR HEMANG INC GUIDE ROADMAPPING  02/07/2022   IR EMBO TUMOR ORGAN ISCHEMIA INFARCT INC GUIDE ROADMAPPING  01/06/2023   IR IMAGING GUIDED PORT INSERTION  06/13/2022   IR IMAGING GUIDED PORT INSERTION  11/25/2022   IR PARACENTESIS  12/17/2021   IR PARACENTESIS  03/25/2022   IR PARACENTESIS  11/25/2022   IR PARACENTESIS  01/06/2023   IR PARACENTESIS  01/19/2023   IR RADIOLOGIST EVAL & MGMT  03/18/2022   IR RADIOLOGIST EVAL & MGMT  05/07/2022   IR RADIOLOGIST EVAL & MGMT  05/16/2022   IR RADIOLOGIST EVAL & MGMT  10/13/2022   IR RADIOLOGIST EVAL & MGMT  12/12/2022   IR RADIOLOGIST EVAL & MGMT  03/30/2023   IR US GUIDE VASC ACCESS RIGHT  02/07/2022   IR US GUIDE VASC ACCESS RIGHT  09/05/2022   JOINT REPLACEMENT     KNEE SURGERY Right 09/24/2017   plates and pins   PORT-A-CATH REMOVAL N/A 09/23/2022   Procedure:  MINOR REMOVAL PORT-A-CATH;  Surgeon: Franky Macho, MD;  Location: [facility];  Service: General;  Laterality: N/A;   TEE WITHOUT CARDIOVERSION N/A 09/23/2022   Procedure: TRANSESOPHAGEAL ECHOCARDIOGRAM (TEE);  Surgeon: Antoine Poche, MD;  Location: [facility];  Service: Endoscopy;  Laterality: N/A;   TOTAL SHOULDER ARTHROPLASTY Right 09/27/2015   Family History:  Family History  Problem Relation Age of Onset   Breast cancer Mother 65   Lymphoma Mother    Diabetes Father    Kidney cancer Father    Heart disease Father    Diabetes Sister    Breast cancer Sister 32   Social History:  Social History   Tobacco Use   Smoking status: Never   Smokeless tobacco: Never  Vaping Use   Vaping status: Never Used  Substance Use Topics   Alcohol use: No   Drug use: Never    Home Medications:  Prior to Admission medications   Medication Sig Start Date End Date Taking? Authorizing Provider  acetaminophen (TYLENOL) 500 MG tablet Take 1 tablet (500 mg total) by mouth 2 (two)  times daily as needed for mild pain. 01/26/23  Yes Angiulli, Mcarthur Rossetti, PA-C  atorvastatin (LIPITOR) 10 MG tablet Take 1 tablet (10 mg total) by mouth daily. 01/26/23  Yes Angiulli, Mcarthur Rossetti, PA-C  butalbital-acetaminophen-caffeine (FIORICET) [id number] MG tablet Take 1 tablet by mouth every 6 (six) hours as needed for headache. 01/26/23  Yes Angiulli, Mcarthur Rossetti, PA-C  Cholecalciferol (D 1000) 25 MCG (1000 UT) capsule Take 1,000 Units by mouth daily. 04/14/23  Yes [provider]  cyanocobalamin (VITAMIN B12) 500 MCG tablet Take 1,000 mcg by mouth daily. 06/28/21  Yes [provider]  diclofenac Sodium (VOLTAREN) 1 % GEL Apply 4 g topically 4 (four) times daily. Patient taking differently: Apply 4 g topically as needed. 01/26/23  Yes Angiulli, Mcarthur Rossetti, PA-C  gabapentin (NEURONTIN) 100 MG capsule Take 1 capsule (100 mg total) by mouth 2 (two) times daily. 02/17/23  Yes Raulkar, Drema Pry, MD  lactulose (CHRONULAC) 10 GM/15ML solution Take 30 mLs (20 g total) by mouth 3 (three) times daily. Patient taking differently: Take 10 mLs by mouth daily. 01/26/23  Yes Angiulli, Mcarthur Rossetti, PA-C  Multiple Vitamins-Minerals (MULTIVITAMIN WITH MINERALS) tablet Take 1 tablet by mouth daily.   Yes [provider]  sertraline (ZOLOFT) 50 MG tablet Take 1 tablet (50 mg total) by mouth daily. 01/26/23  Yes Angiulli, Mcarthur Rossetti, PA-C  spironolactone (ALDACTONE) 50 MG tablet Take 1 tablet (50 mg total) by mouth daily. 01/26/23  Yes Angiulli, Mcarthur Rossetti, PA-C  torsemide (DEMADEX) 20 MG tablet Take 40 mg by mouth daily. 01/26/23  Yes [provider]  Vitamin D, Ergocalciferol, (DRISDOL) 1.25 MG (50000 UNIT) CAPS capsule Take 1 capsule by mouth once a week 03/30/23  Yes Pennington, Rebekah M, PA-C  XIFAXAN 550 MG TABS tablet Take 550 mg by mouth 2 (two) times daily.   Yes [provider]  ciprofloxacin (CIPRO) 500 MG tablet Take 1 tablet (500 mg total) by mouth daily. Re-Start after finished with  cefdinir 03/27/23 03/26/24  Catarina Hartshorn, MD    Inpatient Medications:  Current Facility-Administered Medications:    acetaminophen (TYLENOL) tablet 650 mg, 650 mg, Oral, Q12H PRN, Shahmehdi, Seyed A, MD   cefTRIAXone (ROCEPHIN) 2 g in sodium chloride 0.9 % 100 mL IVPB, 2 g, Intravenous, Q24H, Elgergawy, Leana Roe, MD, Last Rate: 200 mL/hr at 05/02/23 1709, 2 g at 05/02/23 1709   Chlorhexidine Gluconate Cloth  2 % PADS 6 each, 6 each, Topical, Daily, Elgergawy, Leana Roe, MD, 6 each at 05/02/23 1057   cholecalciferol (VITAMIN D3) 25 MCG (1000 UNIT) tablet 1,000 Units, 1,000 Units, Oral, Daily, Elgergawy, Leana Roe, MD, 1,000 Units at 05/03/23 1105   cyanocobalamin (VITAMIN B12) tablet 1,000 mcg, 1,000 mcg, Oral, Daily, Elgergawy, Leana Roe, MD, 1,000 mcg at 05/03/23 1105   diclofenac Sodium (VOLTAREN) 1 % topical gel 4 g, 4 g, Topical, QID, Shahmehdi, Seyed A, MD, 4 g at 05/03/23 1106   heparin injection 5,000 Units, 5,000 Units, Subcutaneous, Q8H, Elgergawy, Leana Roe, MD, 5,000 Units at 05/03/23 0534   HYDROmorphone (DILAUDID) injection 1 mg, 1 mg, Intravenous, Q4H PRN, Shahmehdi, Seyed A, MD   lactulose (CHRONULAC) 10 GM/15ML solution 30 g, 30 g, Oral, TID PC & HS, Elgergawy, Leana Roe, MD, 30 g at 05/03/23 1105   multivitamin with minerals tablet 1 tablet, 1 tablet, Oral, Daily, Elgergawy, Leana Roe, MD, 1 tablet at 05/03/23 1105   oxyCODONE (Oxy IR/ROXICODONE) immediate release tablet 5 mg, 5 mg, Oral, Q4H PRN, Shahmehdi, Seyed A, MD, 5 mg at 04/30/23 1526   promethazine (PHENERGAN) 12.5 mg in sodium chloride 0.9 % 50 mL IVPB, 12.5 mg, Intravenous, Q6H PRN, Shahmehdi, Seyed A, MD   rifaximin (XIFAXAN) tablet 550 mg, 550 mg, Oral, BID, Elgergawy, Leana Roe, MD, 550 mg at 05/03/23 1105   sertraline (ZOLOFT) tablet 50 mg, 50 mg, Oral, Daily, Shahmehdi, Seyed A, MD, 50 mg at 05/03/23 1108   torsemide (DEMADEX) tablet 40 mg, 40 mg, Oral, Daily, Shahmehdi, Seyed A, MD, 40 mg at 05/03/23 1059   Vitamin D  (Ergocalciferol) (DRISDOL) 1.25 MG (50000 UNIT) capsule 50,000 Units, 50,000 Units, Oral, Weekly, Elgergawy, Leana Roe, MD, 50,000 Units at 04/29/23 1610 Allergies: Latex, Tape, and Gramineae pollens  Complete Review of Systems: GENERAL: negative for malaise, night sweats HEENT: No changes in hearing or vision, no nose bleeds or other nasal problems. NECK: Negative for lumps, goiter, pain and significant neck swelling RESPIRATORY: Negative for cough, wheezing CARDIOVASCULAR: Negative for chest pain, leg swelling, palpitations, orthopnea GI: SEE HPI MUSCULOSKELETAL: Negative for joint pain or swelling, back pain, and muscle pain. SKIN: Negative for lesions, rash PSYCH: Negative for sleep disturbance, mood disorder and recent psychosocial stressors. HEMATOLOGY Negative for prolonged bleeding, bruising easily, and swollen nodes. ENDOCRINE: Negative for cold or heat intolerance, polyuria, polydipsia and goiter. NEURO: negative for tremor, gait imbalance, syncope and seizures. The remainder of the review of systems is noncontributory.  Physical Exam: BP (!) 114/53 (BP Location: Left Arm)   Pulse 69   Temp 99.1 F (37.3 C) (Oral)   Resp 20   Ht 5\' 9"  (1.753 m)   Wt 101.1 kg   SpO2 96%   BMI 32.91 kg/m  GENERAL: The patient is AO x2, in no acute distress.  Obese. HEENT: Head is normocephalic and atraumatic. EOMI are intact. Mouth is well hydrated and without lesions. NECK: Supple. No masses LUNGS: Clear to auscultation. No presence of rhonchi/wheezing/rales. Adequate chest expansion HEART: RRR, normal s1 and s2. ABDOMEN: Soft, nontender, no guarding, no peritoneal signs, and nondistended. BS +. No masses. EXTREMITIES: Without any cyanosis, clubbing, rash, lesions or edema. NEUROLOGIC: AOx2, no focal motor deficit.  Has very minimal tremor but no asterixis. SKIN: no jaundice, no rashes  Laboratory Data CBC:     Component Value Date/Time   WBC 6.1 05/03/2023 0515   RBC 3.08 (L)  05/03/2023 0515   HGB 7.2 (L) 05/03/2023 9604  HCT 23.6 (L) 05/03/2023 0841   PLT 168 05/03/2023 0515   MCV 76.9 (L) 05/03/2023 0515   MCH 23.1 (L) 05/03/2023 0515   MCHC 30.0 05/03/2023 0515   RDW 23.1 (H) 05/03/2023 0515   LYMPHSABS 0.3 (L) 04/29/2023 0922   MONOABS 0.4 04/29/2023 0922   EOSABS 0.0 04/29/2023 0922   BASOSABS 0.0 04/29/2023 0922   COAG:  Lab Results  Component Value Date   INR 1.2 03/27/2023   INR 1.2 03/25/2023   INR 1.4 (H) 01/15/2023    BMP:     Latest Ref Rng & Units 05/03/2023    5:15 AM 05/02/2023    4:19 AM 05/01/2023    4:39 AM  BMP  Glucose 70 - 99 mg/dL 621  308  657   BUN 8 - 23 mg/dL 45  47  53   Creatinine 0.44 - 1.00 mg/dL 8.46  9.62  9.52   Sodium 135 - 145 mmol/L 143  141  143   Potassium 3.5 - 5.1 mmol/L 3.7  3.6  3.0   Chloride 98 - 111 mmol/L 116  112  112   CO2 22 - 32 mmol/L 21  19  18    Calcium 8.9 - 10.3 mg/dL 8.9  9.1  9.2     HEPATIC:     Latest Ref Rng & Units 05/03/2023    5:15 AM 05/02/2023    4:19 AM 05/01/2023    4:39 AM  Hepatic Function  Total Protein 6.5 - 8.1 g/dL 5.8  6.0  6.4   Albumin 3.5 - 5.0 g/dL 2.7  2.7  2.8   AST 15 - 41 U/L 34  36  44   ALT 0 - 44 U/L 24  24  26    Alk Phosphatase 38 - 126 U/L 76  77  88   Total Bilirubin 0.3 - 1.2 mg/dL 1.7  2.2  2.1     CARDIAC: No results found for: "CKTOTAL", "CKMB", "CKMBINDEX", "TROPONINI"   Imaging: I personally reviewed and interpreted the available imaging.  Assessment & Plan: Rachel Huber is a 67 y.o. 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  The patient was found to have pneumonia  for which she was started on antibiotic coverage.  Gastroenterology was consulted for evaluation of anemia and management of cirrhosis.  The patient presented encephalopathy, which is likely multifactorial.  She was likely taking less lactulose that she showed and it is not clear if she was taking Xifaxan.  This medication should be continued during the current hospitalization to manage her hepatic encephalopathy, but I do not think that they are driving her current mental status changes as her ammonia is completely normal.  This is likely a result of her ongoing pneumonia (for which she should continue antibiotic treatment directed by primary team).  She is not presenting any significant third spacing at the moment and no requirement of paracentesis is needed for now.  Was noted to have worsening anemia but no signs of overt gastrointestinal bleeding.  In fact, if anything her bleeding episodes have clinically much more improved compared to prior admissions.  She also has thalassemia which may make her  prone to worsening anemia.  She may need to have transfusions as needed as an anemia may be a result of hypersplenism.  We will not pursue any endoscopic investigations unless she presents any overt significant bleeding.  Will need to check her H&H on a daily basis.  - Repeat CBC qday, transfuse if Hb <7 - Pantoprazole 40 mg qday - 2 large bore IV lines - Active T/S - Keep NPO - Avoid NSAIDs -Continue lactulose, titrate to achieve 2-3 bowel movements per day -No need to monitor ammonia levels -Minimize use of opiates -Xifaxan 550 mg twice daily -Antibiotic treatment for pneumonia as directed by primary team  Katrinka Blazing, MD Gastroenterology and Hepatology [facility]

## 2023-05-04 ENCOUNTER — Inpatient Hospital Stay: Payer: Medicare Other

## 2023-05-04 DIAGNOSIS — R4182 Altered mental status, unspecified: Secondary | ICD-10-CM | POA: Diagnosis not present

## 2023-05-04 DIAGNOSIS — K7682 Hepatic encephalopathy: Secondary | ICD-10-CM | POA: Diagnosis not present

## 2023-05-04 DIAGNOSIS — K7460 Unspecified cirrhosis of liver: Secondary | ICD-10-CM | POA: Diagnosis not present

## 2023-05-04 DIAGNOSIS — N1832 Chronic kidney disease, stage 3b: Secondary | ICD-10-CM

## 2023-05-04 DIAGNOSIS — D631 Anemia in chronic kidney disease: Secondary | ICD-10-CM

## 2023-05-04 DIAGNOSIS — R188 Other ascites: Secondary | ICD-10-CM

## 2023-05-04 LAB — CBC
HCT: 24.2 % — ABNORMAL LOW (ref 36.0–46.0)
Hemoglobin: 7.2 g/dL — ABNORMAL LOW (ref 12.0–15.0)
MCH: 23 pg — ABNORMAL LOW (ref 26.0–34.0)
MCHC: 29.8 g/dL — ABNORMAL LOW (ref 30.0–36.0)
MCV: 77.3 fL — ABNORMAL LOW (ref 80.0–100.0)
Platelets: 181 10*3/uL (ref 150–400)
RBC: 3.13 MIL/uL — ABNORMAL LOW (ref 3.87–5.11)
RDW: 23.5 % — ABNORMAL HIGH (ref 11.5–15.5)
WBC: 8.6 10*3/uL (ref 4.0–10.5)
nRBC: 0 % (ref 0.0–0.2)

## 2023-05-04 LAB — PREPARE RBC (CROSSMATCH)

## 2023-05-04 LAB — HEMOGLOBIN AND HEMATOCRIT, BLOOD
HCT: 26.6 % — ABNORMAL LOW (ref 36.0–46.0)
Hemoglobin: 8.5 g/dL — ABNORMAL LOW (ref 12.0–15.0)

## 2023-05-04 MED ORDER — SODIUM CHLORIDE 0.9% IV SOLUTION: Freq: Once | INTRAVENOUS | Status: AC

## 2023-05-04 NOTE — Care Management Important Message (Signed)
Important Message  Patient Details  Name: Rachel Huber MRN: [id number] Date of Birth: 07/24/1956   Medicare Important Message Given:  Yes     Corey Harold 05/04/2023, 12:01 PM

## 2023-05-04 NOTE — Progress Notes (Cosign Needed Addendum)
Gastroenterology Progress Note   Referring Provider: No ref. provider found Primary Care Physician:  Lauro Regulus, MD Primary Gastroenterologist:  Dr. Mia Creek [facility])  Patient ID: Rachel Huber; [id number]; August 20, 1955    Subjective   Denies any abdominal pain, N/V. BM 1-2 times daily per patient. She reports feeling like she is confused on if we are confusing her or if it is her medications. She endorses being in the hospital due to not taking her lactulose as she should at home.   Objective   Vital signs in last 24 hours Temp:  [98 F (36.7 C)-100.1 F (37.8 C)] 98.5 F (36.9 C) (09/16 1129) Pulse Rate:  [77] 77 (09/16 0501) Resp:  [17-20] 17 (09/16 1129) BP: (106-131)/(45-53) 131/53 (09/16 1129) SpO2:  [96 %-100 %] 97 % (09/16 1129) Last BM Date : 05/03/23  Physical Exam General:   Alert and oriented, pleasant, mildly confused Head:  Normocephalic and atraumatic. Eyes:  No icterus, sclera clear. Conjuctiva pink.  Mouth:  Without lesions, mucosa pink and moist.  Abdomen:  Bowel sounds present, soft, non-tender, mild distention. No HSM or hernias noted. No rebound or guarding. No masses appreciated  Msk:  swelling and erythema to left 1&2 digits and 1st digit on right hand.  Extremities:  Without clubbing or edema. Neurologic:  Alert and  oriented x4;  mild asterixis, baseline tremor bilaterally.  Skin:  Warm and dry, intact without significant lesions.  Psych:  Alert and cooperative. Normal mood and affect.  Intake/Output from previous day: 09/15 0701 - 09/16 0700 In: 577.1 [P.O.:477; IV Piggyback:100.1] Out: 1100 [Urine:1100] Intake/Output this shift: No intake/output data recorded.  Lab Results  Recent Labs    05/02/23 0419 05/03/23 0515 05/03/23 0841 05/04/23 0528  WBC 6.3 6.1  --  8.6  HGB 7.5* 7.1* 7.2* 7.2*  HCT 24.1* 23.7* 23.6* 24.2*  PLT 156 168  --  181   BMET Recent Labs    05/02/23 0419 05/03/23 0515  NA 141 143  K 3.6 3.7   CL 112* 116*  CO2 19* 21*  GLUCOSE 144* 139*  BUN 47* 45*  CREATININE 1.35* 1.25*  CALCIUM 9.1 8.9   LFT Recent Labs    05/02/23 0419 05/03/23 0515  PROT 6.0* 5.8*  ALBUMIN 2.7* 2.7*  AST 36 34  ALT 24 24  ALKPHOS 77 76  BILITOT 2.2* 1.7*   PT/INR No results for input(s): "LABPROT", "INR" in the last 72 hours. Hepatitis Panel No results for input(s): "HEPBSAG", "HCVAB", "HEPAIGM", "HEPBIGM" in the last 72 hours.  Studies/Results MR BRAIN WO CONTRAST  Result Date: 05/02/2023 CLINICAL DATA:  Altered mental status. EXAM: MRI HEAD WITHOUT CONTRAST TECHNIQUE: Multiplanar, multiecho pulse sequences of the brain and surrounding structures were obtained without intravenous contrast. COMPARISON:  CT head without contrast 04/29/2023 and 09/21/2017 FINDINGS: Brain: A heavily calcified meningioma seen on prior head CTs in the right middle cranial fossa measures 2.2 x 1.5 cm. There is some mass effect on the lateral aspect of the right temporal tip without significant signal change. Mild atrophy and periventricular white matter changes are within normal limits for age. White matter is otherwise within normal limits. Deep brain nuclei are within normal limits. The ventricles are of normal size. No significant extraaxial fluid collection is present. The brainstem and cerebellum are within normal limits. The internal auditory canals are within normal limits. Midline structures are within normal limits. Vascular: Flow is present in the major intracranial arteries. Skull and upper cervical spine:  The craniocervical junction is normal. Upper cervical spine is within normal limits. Marrow signal is unremarkable. Sinuses/Orbits: A chronic polyp or mucous retention cyst is present in the right sphenoid sinus. Mild mucosal thickening is present the anterior ethmoid air cells. Right greater than left mastoid effusions are present. No obstructing nasopharyngeal lesion is present. Additional small polyps are  present in the inferior left maxillary sinus. IMPRESSION: 1. 2.2 x 1.5 cm heavily calcified meningioma in the right middle cranial fossa with some mass effect on the lateral aspect of the right temporal tip without significant signal change. 2. Otherwise normal MRI appearance of the brain for age. No acute intracranial abnormality to explain altered mental status. 3. Right greater than left mastoid effusions. No obstructing nasopharyngeal lesion is present. 4. Scattered sinus disease as described. Electronically Signed   By: Marin Roberts M.D.   On: 05/02/2023 20:01   DG Chest Portable 1 View  Result Date: 04/29/2023 CLINICAL DATA:  Cough, altered mental status EXAM: PORTABLE CHEST 1 VIEW COMPARISON:  Prior chest x-ray 01/08/2023 FINDINGS: Stable mild cardiomegaly. Right IJ approach single-lumen power injectable port catheter. Catheter tip overlies the upper atrium. Low inspiratory volumes. Increased pulmonary vascular congestion bordering on mild edema. Probable bibasilar atelectasis. Nonspecific foci of patchy airspace opacity in the bilateral upper lungs. Embolization coils noted in the left upper quadrant, likely within the splenic artery. Surgical changes of prior right reverse shoulder arthroplasty. IMPRESSION: 1. Low lung volumes with increased pulmonary vascular congestion bordering on mild edema. At least mild CHF is favored. 2. Nonspecific patchy airspace opacities in both upper lungs. Differential considerations include multifocal pneumonia versus more confluent areas of pulmonary edema. 3. Well-positioned port catheter. Electronically Signed   By: Malachy Moan M.D.   On: 04/29/2023 15:17   CT Head Wo Contrast  Result Date: 04/29/2023 CLINICAL DATA:  Altered mental status, unknown cause EXAM: CT HEAD WITHOUT CONTRAST TECHNIQUE: Contiguous axial images were obtained from the base of the skull through the vertex without intravenous contrast. RADIATION DOSE REDUCTION: This exam was  performed according to the departmental dose-optimization program which includes automated exposure control, adjustment of the mA and/or kV according to patient size and/or use of iterative reconstruction technique. COMPARISON:  03/25/2023 FINDINGS: Brain: No evidence of acute infarction, hemorrhage, parenchyma mass, mass effect, or midline shift. No hydrocephalus or extra-axial fluid collection. Redemonstrated calcified meningioma along the right temporal convexity, which measures up to 2.3 x 1.3 x 1.8 cm (AP x TR x CC), with unchanged mass effect on the right temporal lobe. Vascular: No hyperdense vessel. Skull: Negative for fracture or focal lesion. Sinuses/Orbits: Right sphenoid mucous retention cyst. Mild mucosal thickening in the ethmoid air cells. Status post bilateral lens replacements. Other: Trace fluid in the right mastoid air cells. IMPRESSION: 1. No acute intracranial process. 2. Unchanged calcified meningioma along the right temporal convexity. Electronically Signed   By: Wiliam Ke M.D.   On: 04/29/2023 12:25   [place] ASCITES (ABDOMEN LIMITED)  Result Date: 04/07/2023 INDICATION: Patient with a history of cirrhosis with recurrent ascites. EXAM: ULTRASOUND ABDOMEN LIMITED FINDINGS: Imaging of all 4 quadrants of the abdomen reveals small to moderate ascites in right upper quadrant and right lower quadrant. No ascites visualized in left upper or left lower quadrants. Patient deferred paracentesis at this time as she was concerned about excessive pain outweighing her concern for ascites. IMPRESSION: Small volume of intra-abdominal ascites. Paracentesis was NOT performed today secondary to the patient's preference. Read by: Mina Marble, PA-C Electronically Signed  By: Roanna Banning M.D.   On: 04/07/2023 12:49    Assessment  67 y.o. 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  She was found to have pneumonia as well as started on antibiotic therapy.  GI consulted for evaluation of anemia and management of cirrhosis given hepatic encephalopathy.  Cirrhosis: History of NASH cirrhosis with refractory ascites.  She underwent TIPS with revision x 2.  Currently without any peripheral edema or anasarca.  Abdomen mildly distended but no significant amount of fluid present to warrant paracentesis.  As stated below she has chronic anemia but no overt bleeding from upper GI source.  Last EGD in February 2022 without evidence of varices. MELD 3.0: 18 from labs in August.  She follows with Dr. Mia Creek with [facility] as well as [facility] for liver transplant evaluation.  Hepatic Encephalopathy: Patient presented with hepatic encephalopathy.  Patient admitted to not taking lactulose as prescribed.  Currently reports 1-2 bowel movements daily and is soft in nature.  Although she is alert and oriented x 4 today she is still exhibiting some mild confusion.  Has a baseline tremor and some mild asterixis.  Although completely disoriented on arrival, this is significantly improved.  Ammonia level was slightly elevated on admission.  It appears she was recently prescribed Xifaxan for cephalopathy however reportedly was unable to afford this medication and it is unclear if she has submitted for patient assistance.  Recommend continuing while inpatient.  Ideally should be continued outpatient as well.  Will need her primary GIs or [facility] transplant help in getting patient assistance.  Anemia: History of recurrent complex rectal bleeding requiring rectal artery embolization in the past.  Also with grade 4 hemorrhoids with recurrent bleeding.  She also has a history of  thalassemia minor which is contributing to her anemia.  She requires frequent transfusions.  She has a history of splenic artery embolization as well.  Last flexible sigmoidoscopy in May 2024 showed hemorrhoids and stool in the rectum and sigmoid without any presence of ongoing bleeding or recent bleeding.  Current hemoglobin stable at 7.1-7.5 this admission.  She has had her hemoglobin as high as 8.7 recently.  She will need frequent checks outpatient and needs to continue to follow-up with IR who she last seen on 03/30/2023 who recommended repeating abdominal imaging given her worsening anemia and rectal bleeding.  It appears patient will be leaving and going to SNF on discharge.   Plan / Recommendations  Trend H/H, transfuse Hgb <7 PPI daily Avoid NSAIDs Continue lactulose, titrate to achieve 2-3 bowel movements daily.  Consider enemas if not taking p.o. Continue Xifaxan 550 mg BID - needs patient assistance for medication.  Minimize narcotics Continue antibiotics for treatment of pneumonia per hospitalist Needs to follow-up with primary GI (last visit October 2023) as well as with IR    LOS: 5 days    05/04/2023, 11:31 AM   Brooke Bonito, MSN, FNP-BC, AGACNP-BC [facility]

## 2023-05-04 NOTE — Discharge Summary (Signed)
Physician Discharge Summary   Patient: Rachel Huber MRN: [id number] DOB: April 18, 1956  Admit date:     04/29/2023  Discharge date: 05/04/23  Discharge Physician: Kendell Bane   PCP: Lauro Regulus, MD   Recommendations at discharge:   Continue lactulose, monitor ammonia level with any change in mental status If unable to tolerate oral lactulose -recommended to be given rectally by enema Rectal lactulose enema when encephalopathic Check labs q. weekly CMP, CBC Follow-up with the PCP, outpatient palliative care in 1 to 2 weeks  Discharge Diagnoses: Principal Problem:   AMS (altered mental status) Active Problems:   Acute on chronic anemia   Acute hepatic encephalopathy (HCC)   Liver cirrhosis secondary to NASH (HCC)   HLD (hyperlipidemia)   OSA on CPAP   Obesity (BMI 30-39.9)   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 mellitus (HCC)  Resolved Problems:   * No resolved hospital problems. *  Hospital Course: Rachel Huber  is a 67 y.o. female, with history of DM2, CKD 3b, Rachel Huber liver cirrhosis with refractory ascites status post TIPS creation (s/p revision x2), thalassemia minor, significant rectal bleeding status post superior rectal artery embolization x 2 on 02/07/22 and 11/05/22, with recurrent grade 4 internal hemorrhoids and coil embolization of the splenic artery 01/06/2023 , patient with recent hospitalization 8/6 until 03/27/19/2024 secondary to hyperammonemia, UTI and ascites . -Patient was brought today secondary to altered mental status, she is confused unable to provide any history, it was obtained from husband by phone, ED physician, apparently she has not been taking her lactulose for last 2 days, had some nausea and vomiting yesterday, she had 1-2 loose stools,, her son-in-law apparently had what sounds like a viral gastroenteritis, she started to develop some of the symptoms yesterday.  -in ED she was noted to be febrile 100.2,COVID was negative, chest x-ray concerning  for pneumonia, creatinine at baseline 1.62, glucose elevated at 198, ammonia elevated at 68, hemoglobin stable at 8.1, platelets low at 146, treated for pneumonia, and received 1 dose of lactulose, and [facility] consulted to admit.       AMS- Acute metabolic encephalopathy. Acute hepatic encephalopathy. -At baseline with  slow speech, slow comprehension  -Patient back to baseline, pleasantly confused, with slow speech in response  -MRI of the brain reported chronic meningioma, no acute stroke  -Per husband mentation is at baseline no -Confusion due to hyperammonemia, acute hepatic encephalopathy Also may be was exacerbated infection possible pneumonia  -Continue to hold Lexapro and gabapentin  -As discussed with husband, she has not been compliant with her lactulose for last 2 days,  - Ammonia level 68 >>72 >>> 61 >>26    -Encourage p.o. lactulose 30 g 4 times daily - Monitor ammonia levels-  -Mental status or unable to take p.o.,  lactulose rectal enema daily recommended   Hyperammonemia -Please see above discussion -Will continue p.o. lactulose,And Rifaximin -As needed lactulose enema   Pneumonia Resolved Hemodynamically stable and POA: febrile 100.2, UA is negative, COVID is negative. -Chest x-ray concerning for pneumonia, >> on IV Rocephin and azithromycin,  -Cultures remain negative, with no growth    Cirrhosis due to NASH -Resuming torsemide and Aldactone  -Continue p.o. lactulose -Continue with rifaximin   Acute on chronic microcytic anemia -chronic disease, including liver cirrhosis and CKD Anemia of chronic kidney disease -Hemoglobin remained stable, monitoring Hemoglobin dropped to 7.2, with hypotension -S/p transfused PRBC 1 unit 05/04/2023   CKD stage IIIb -Baseline creatinine 1.3-1.6 Lab  Results  Component Value Date   CREATININE 1.25 (H) 05/03/2023   CREATININE 1.35 (H) 05/02/2023   CREATININE 1.74 (H) 05/01/2023     -Monitor renal  function   Mild dehydration/hyponatremia S/p IV fluid hydration LR 1 20Kcl  Type 2 DM with hyperglycemia Diet Controlled. -CBG q. ACHS   Depression with anxiety -Resume Zoloft   Anemia of chronic disease due to liver cirrhosis  -Monitor H&H, dropped to 7.2     Latest Ref Rng & Units 05/04/2023    5:28 AM 05/03/2023    8:41 AM 05/03/2023    5:15 AM  CBC  WBC 4.0 - 10.5 K/uL 8.6   6.1   Hemoglobin 12.0 - 15.0 g/dL 7.2  7.2  7.1   Hematocrit 36.0 - 46.0 % 24.2  23.6  23.7   Platelets 150 - 400 K/uL 181   168    -S/p transfused PRBC 1 unit 05/04/2023  Check labs q. weekly CMP, CBC    thrombocytopenia -Monitoring -this is secondary to liver disease, monitoring platelet counts, continue to monitor, SCD for DVT prophylaxis       DVT Prophylaxis SCD for now, anemia requiring frequent PRBC transfusion   Consultants: GI  Procedures performed: Lactulose rectal enema Disposition: Skilled nursing facility Diet recommendation:  Discharge Diet Orders (From admission, onward)     Start     Ordered   05/04/23 0000  Diet - low sodium heart healthy        05/04/23 1022           Regular diet DISCHARGE MEDICATION: Allergies as of 05/04/2023       Reactions   Latex Rash   Contact rash   Tape Other (See Comments)   Skin sensitivity   Gramineae Pollens Other (See Comments)   Sneezing, Running nose Other Reaction(s): Other (see comments)        Medication List     STOP taking these medications    atorvastatin 10 MG tablet Commonly known as: LIPITOR   butalbital-acetaminophen-caffeine 50-325-40 MG tablet Commonly known as: FIORICET   ciprofloxacin 500 MG tablet Commonly known as: CIPRO   gabapentin 100 MG capsule Commonly known as: Neurontin   spironolactone 50 MG tablet Commonly known as: ALDACTONE       TAKE these medications    acetaminophen 500 MG tablet Commonly known as: TYLENOL Take 1 tablet (500 mg total) by mouth 2 (two) times daily as  needed for mild pain.   cyanocobalamin 500 MCG tablet Commonly known as: VITAMIN B12 Take 1,000 mcg by mouth daily.   D 1000 25 MCG (1000 UT) capsule Generic drug: Cholecalciferol Take 1,000 Units by mouth daily.   diclofenac Sodium 1 % Gel Commonly known as: VOLTAREN Apply 4 g topically 4 (four) times daily. What changed:  when to take this reasons to take this   lactulose 10 GM/15ML solution Commonly known as: CHRONULAC Take 30 mLs (20 g total) by mouth 3 (three) times daily. What changed:  how much to take when to take this   multivitamin with minerals tablet Take 1 tablet by mouth daily.   sertraline 50 MG tablet Commonly known as: ZOLOFT Take 1 tablet (50 mg total) by mouth daily.   torsemide 20 MG tablet Commonly known as: DEMADEX Take 40 mg by mouth daily.   Vitamin D (Ergocalciferol) 1.25 MG (50000 UNIT) Caps capsule Commonly known as: DRISDOL Take 1 capsule by mouth once a week   Xifaxan 550 MG Tabs tablet Generic drug: rifaximin  Take 550 mg by mouth 2 (two) times daily.        Contact information for after-discharge care     Destination     [facility] .   Service: Skilled Nursing Contact information: [address] [place] [address] [phone]                    Discharge Exam: Ceasar Mons Weights   04/29/23 0847 04/29/23 1839  Weight: 104.3 kg 101.1 kg        General:  AAO x 1, with slow speech cooperative, no distress;   HEENT:  Normocephalic, PERRL, otherwise with in Normal limits   Neuro:  Speech slow, waxing and waning comprehension  CNII-XII intact. , normal motor and sensation, reflexes intact   Lungs:   Clear to auscultation BL, Respirations unlabored,  No wheezes / crackles  Cardio:    S1/S2, RRR, No murmure, No Rubs or Gallops   Abdomen:  Soft, non-tender, bowel sounds active all four quadrants, no guarding or peritoneal signs.  Muscular  skeletal:  Limited exam -sever  global generalized weaknesses - in bed, able to move all 4 extremities,   2+ pulses,  symmetric, No pitting edema  Skin:  Dry, warm to touch, negative for any Rashes,  Wounds: Please see nursing documentation          Condition at discharge: fair  The results of significant diagnostics from this hospitalization (including imaging, microbiology, ancillary and laboratory) are listed below for reference.   Imaging Studies: MR BRAIN WO CONTRAST  Result Date: 05/02/2023 CLINICAL DATA:  Altered mental status. EXAM: MRI HEAD WITHOUT CONTRAST TECHNIQUE: Multiplanar, multiecho pulse sequences of the brain and surrounding structures were obtained without intravenous contrast. COMPARISON:  CT head without contrast 04/29/2023 and 09/21/2017 FINDINGS: Brain: A heavily calcified meningioma seen on prior head CTs in the right middle cranial fossa measures 2.2 x 1.5 cm. There is some mass effect on the lateral aspect of the right temporal tip without significant signal change. Mild atrophy and periventricular white matter changes are within normal limits for age. White matter is otherwise within normal limits. Deep brain nuclei are within normal limits. The ventricles are of normal size. No significant extraaxial fluid collection is present. The brainstem and cerebellum are within normal limits. The internal auditory canals are within normal limits. Midline structures are within normal limits. Vascular: Flow is present in the major intracranial arteries. Skull and upper cervical spine: The craniocervical junction is normal. Upper cervical spine is within normal limits. Marrow signal is unremarkable. Sinuses/Orbits: A chronic polyp or mucous retention cyst is present in the right sphenoid sinus. Mild mucosal thickening is present the anterior ethmoid air cells. Right greater than left mastoid effusions are present. No obstructing nasopharyngeal lesion is present. Additional small polyps are present in the inferior  left maxillary sinus. IMPRESSION: 1. 2.2 x 1.5 cm heavily calcified meningioma in the right middle cranial fossa with some mass effect on the lateral aspect of the right temporal tip without significant signal change. 2. Otherwise normal MRI appearance of the brain for age. No acute intracranial abnormality to explain altered mental status. 3. Right greater than left mastoid effusions. No obstructing nasopharyngeal lesion is present. 4. Scattered sinus disease as described. Electronically Signed   By: Marin Roberts M.D.   On: 05/02/2023 20:01   DG Chest Portable 1 View  Result Date: 04/29/2023 CLINICAL DATA:  Cough, altered mental status EXAM: PORTABLE CHEST 1 VIEW  COMPARISON:  Prior chest x-ray 01/08/2023 FINDINGS: Stable mild cardiomegaly. Right IJ approach single-lumen power injectable port catheter. Catheter tip overlies the upper atrium. Low inspiratory volumes. Increased pulmonary vascular congestion bordering on mild edema. Probable bibasilar atelectasis. Nonspecific foci of patchy airspace opacity in the bilateral upper lungs. Embolization coils noted in the left upper quadrant, likely within the splenic artery. Surgical changes of prior right reverse shoulder arthroplasty. IMPRESSION: 1. Low lung volumes with increased pulmonary vascular congestion bordering on mild edema. At least mild CHF is favored. 2. Nonspecific patchy airspace opacities in both upper lungs. Differential considerations include multifocal pneumonia versus more confluent areas of pulmonary edema. 3. Well-positioned port catheter. Electronically Signed   By: Malachy Moan M.D.   On: 04/29/2023 15:17   CT Head Wo Contrast  Result Date: 04/29/2023 CLINICAL DATA:  Altered mental status, unknown cause EXAM: CT HEAD WITHOUT CONTRAST TECHNIQUE: Contiguous axial images were obtained from the base of the skull through the vertex without intravenous contrast. RADIATION DOSE REDUCTION: This exam was performed according to the  departmental dose-optimization program which includes automated exposure control, adjustment of the mA and/or kV according to patient size and/or use of iterative reconstruction technique. COMPARISON:  03/25/2023 FINDINGS: Brain: No evidence of acute infarction, hemorrhage, parenchyma mass, mass effect, or midline shift. No hydrocephalus or extra-axial fluid collection. Redemonstrated calcified meningioma along the right temporal convexity, which measures up to 2.3 x 1.3 x 1.8 cm (AP x TR x CC), with unchanged mass effect on the right temporal lobe. Vascular: No hyperdense vessel. Skull: Negative for fracture or focal lesion. Sinuses/Orbits: Right sphenoid mucous retention cyst. Mild mucosal thickening in the ethmoid air cells. Status post bilateral lens replacements. Other: Trace fluid in the right mastoid air cells. IMPRESSION: 1. No acute intracranial process. 2. Unchanged calcified meningioma along the right temporal convexity. Electronically Signed   By: Wiliam Ke M.D.   On: 04/29/2023 12:25   [place] ASCITES (ABDOMEN LIMITED)  Result Date: 04/07/2023 INDICATION: Patient with a history of cirrhosis with recurrent ascites. EXAM: ULTRASOUND ABDOMEN LIMITED FINDINGS: Imaging of all 4 quadrants of the abdomen reveals small to moderate ascites in right upper quadrant and right lower quadrant. No ascites visualized in left upper or left lower quadrants. Patient deferred paracentesis at this time as she was concerned about excessive pain outweighing her concern for ascites. IMPRESSION: Small volume of intra-abdominal ascites. Paracentesis was NOT performed today secondary to the patient's preference. Read by: Mina Marble, PA-C Electronically Signed   By: Roanna Banning M.D.   On: 04/07/2023 12:49    Microbiology: Results for orders placed or performed during the hospital encounter of 04/29/23  Resp panel by RT-PCR (RSV, Flu A&B, Covid) Urine, In & Out Cath     Status: None   Collection Time: 04/29/23  3:33 PM    Specimen: Urine, In & Out Cath; Nasal Swab  Result Value Ref Range Status   SARS Coronavirus 2 by RT PCR NEGATIVE NEGATIVE Final    Comment: (NOTE) SARS-CoV-2 target nucleic acids are NOT DETECTED.  The SARS-CoV-2 RNA is generally detectable in upper respiratory specimens during the acute phase of infection. The lowest concentration of SARS-CoV-2 viral copies this assay can detect is 138 copies/mL. A negative result does not preclude SARS-Cov-2 infection and should not be used as the sole basis for treatment or other patient management decisions. A negative result may occur with  improper specimen collection/handling, submission of specimen other than nasopharyngeal swab, presence of viral mutation(s) within the  areas targeted by this assay, and inadequate number of viral copies(<138 copies/mL). A negative result must be combined with clinical observations, patient history, and epidemiological information. The expected result is Negative.  Fact Sheet for Patients:  BloggerCourse.com  Fact Sheet for Healthcare Providers:  SeriousBroker.it  This test is no t yet approved or cleared by the [place] FDA and  has been authorized for detection and/or diagnosis of SARS-CoV-2 by FDA under an Emergency Use Authorization (EUA). This EUA will remain  in effect (meaning this test can be used) for the duration of the COVID-19 declaration under Section 564(b)(1) of the Act, 21 U.S.C.section 360bbb-3(b)(1), unless the authorization is terminated  or revoked sooner.       Influenza A by PCR NEGATIVE NEGATIVE Final   Influenza B by PCR NEGATIVE NEGATIVE Final    Comment: (NOTE) The Xpert Xpress SARS-CoV-2/FLU/RSV plus assay is intended as an aid in the diagnosis of influenza from Nasopharyngeal swab specimens and should not be used as a sole basis for treatment. Nasal washings and aspirates are unacceptable for Xpert Xpress  SARS-CoV-2/FLU/RSV testing.  Fact Sheet for Patients: BloggerCourse.com  Fact Sheet for Healthcare Providers: SeriousBroker.it  This test is not yet approved or cleared by the [place] FDA and has been authorized for detection and/or diagnosis of SARS-CoV-2 by FDA under an Emergency Use Authorization (EUA). This EUA will remain in effect (meaning this test can be used) for the duration of the COVID-19 declaration under Section 564(b)(1) of the Act, 21 U.S.C. section 360bbb-3(b)(1), unless the authorization is terminated or revoked.     Resp Syncytial Virus by PCR NEGATIVE NEGATIVE Final    Comment: (NOTE) Fact Sheet for Patients: BloggerCourse.com  Fact Sheet for Healthcare Providers: SeriousBroker.it  This test is not yet approved or cleared by the [place] FDA and has been authorized for detection and/or diagnosis of SARS-CoV-2 by FDA under an Emergency Use Authorization (EUA). This EUA will remain in effect (meaning this test can be used) for the duration of the COVID-19 declaration under Section 564(b)(1) of the Act, 21 U.S.C. section 360bbb-3(b)(1), unless the authorization is terminated or revoked.  Performed at [facility], [address] [place], [place] [address]     Labs: CBC: Recent Labs  Lab 04/29/23 [phone] 04/30/23 0423 05/01/23 0439 05/02/23 0419 05/03/23 0515 05/03/23 0841 05/04/23 0528  WBC 5.5 6.0 11.0* 6.3 6.1  --  8.6  NEUTROABS 4.8  --   --   --   --   --   --   HGB 8.1* 8.7* 8.5* 7.5* 7.1* 7.2* 7.2*  HCT 25.4* 28.8* 27.2* 24.1* 23.7* 23.6* 24.2*  MCV 74.5* 76.8* 74.5* 75.8* 76.9*  --  77.3*  PLT 146* 155 174 156 168  --  181   Basic Metabolic Panel: Recent Labs  Lab 04/29/23 0922 04/30/23 0423 04/30/23 0841 05/01/23 0439 05/02/23 0419 05/03/23 0515  NA 134* 136  --  143 141 143  K 3.9 3.3*  --  3.0* 3.6 3.7  CL 102 106  --   112* 112* 116*  CO2 22 16*  --  18* 19* 21*  GLUCOSE 198* 164*  --  192* 144* 139*  BUN 50* 46*  --  53* 47* 45*  CREATININE 1.62* 1.48*  --  1.74* 1.35* 1.25*  CALCIUM 8.7* 9.0  --  9.2 9.1 8.9  MG  --   --  1.9  --   --   --    Liver Function Tests: Recent  Labs  Lab 04/29/23 0922 04/30/23 0423 05/01/23 0439 05/02/23 0419 05/03/23 0515  AST 53* 47* 44* 36 34  ALT 30 26 26 24 24   ALKPHOS 104 96 88 77 76  BILITOT 2.8* 2.0* 2.1* 2.2* 1.7*  PROT 6.1* 6.3* 6.4* 6.0* 5.8*  ALBUMIN 2.9* 2.8* 2.8* 2.7* 2.7*   CBG: No results for input(s): "GLUCAP" in the last 168 hours.  Discharge time spent: greater than 50 minutes.  Signed: Kendell Bane, MD [facility] 05/04/2023

## 2023-05-04 NOTE — TOC Transition Note (Signed)
Transition of Care [facility]) - CM/SW Discharge Note   Patient Details  Name: Rachel Huber MRN: [id number] Date of Birth: [birth year]  [facility]) CM/SW Contact:  Leitha Bleak, RN Phone Number: 05/04/2023, 10:40 AM   Clinical Narrative   Patient discharging to [facility] today after unit of blood, Kerri provided a room number, RN will call report. DC summary sent in the hub and spouse updated.    Final next level of care: Skilled Nursing Facility Barriers to Discharge: Barriers Resolved  Patient Goals and CMS Choice CMS Medicare.gov Compare Post Acute Care list provided to:: Patient Choice offered to / list presented to : Patient  Discharge Placement                Patient to be transferred to facility by: [facility] staff Name of family member notified: [name] - spouse Patient and family notified of of transfer: 05/04/23  Discharge Plan and Services Additional resources added to the After Visit Summary for       Social Determinants of Health (SDOH) Interventions SDOH Screenings   Food Insecurity: Patient Declined (03/25/2023)  Housing: Patient Declined (03/25/2023)  Transportation Needs: Patient Declined (03/25/2023)  Utilities: Patient Declined (03/25/2023)  Alcohol Screen: Low Risk  (04/04/2022)  Depression (PHQ2-9): Low Risk  (04/27/2023)  Financial Resource Strain: Low Risk  (04/04/2022)  Physical Activity: Inactive (04/04/2022)  Social Connections: Moderately Isolated (04/04/2022)  Stress: No Stress Concern Present (04/04/2022)  Tobacco Use: Low Risk  (04/29/2023)     Readmission Risk Interventions    04/30/2023   12:35 PM 12/19/2022   10:27 AM 11/04/2022   11:32 AM  Readmission Risk Prevention Plan  Transportation Screening Complete Complete Complete  Medication Review Oceanographer) Complete Complete Complete  PCP or Specialist appointment within 3-5 days of discharge Not Complete Complete Complete  HRI or Home Care Consult Complete Complete   SW Recovery  Care/Counseling Consult Complete Complete Complete  Palliative Care Screening Not Applicable Not Applicable Not Applicable  Skilled Nursing Facility Not Complete Not Applicable Not Applicable

## 2023-05-04 NOTE — Progress Notes (Signed)
Blood transfusion complete. Patient vital signs stable. Pt resting at this time.    Spoke with Idell Pickles at [facility] and provided report.

## 2023-05-04 NOTE — Plan of Care (Signed)
  Problem: Fluid Volume: Goal: Hemodynamic stability will improve Outcome: Not Progressing   Problem: Activity: Goal: Ability to return to baseline activity level will improve Outcome: Not Progressing   Problem: Coping: Goal: Level of anxiety will decrease Outcome: Not Progressing   Problem: Pain Managment: Goal: General experience of comfort will improve Outcome: Not Progressing

## 2023-05-05 ENCOUNTER — Non-Acute Institutional Stay (SKILLED_NURSING_FACILITY): Payer: Medicare Other | Admitting: Adult Health

## 2023-05-05 ENCOUNTER — Encounter: Payer: Self-pay | Admitting: Adult Health

## 2023-05-05 ENCOUNTER — Ambulatory Visit: Payer: Medicare Other

## 2023-05-05 DIAGNOSIS — D696 Thrombocytopenia, unspecified: Secondary | ICD-10-CM

## 2023-05-05 DIAGNOSIS — K7682 Hepatic encephalopathy: Secondary | ICD-10-CM | POA: Diagnosis not present

## 2023-05-05 DIAGNOSIS — E559 Vitamin D deficiency, unspecified: Secondary | ICD-10-CM | POA: Insufficient documentation

## 2023-05-05 DIAGNOSIS — N183 Chronic kidney disease, stage 3 unspecified: Secondary | ICD-10-CM

## 2023-05-05 DIAGNOSIS — D631 Anemia in chronic kidney disease: Secondary | ICD-10-CM

## 2023-05-05 DIAGNOSIS — D61818 Other pancytopenia: Secondary | ICD-10-CM | POA: Diagnosis not present

## 2023-05-05 DIAGNOSIS — R600 Localized edema: Secondary | ICD-10-CM | POA: Insufficient documentation

## 2023-05-05 DIAGNOSIS — D563 Thalassemia minor: Secondary | ICD-10-CM

## 2023-05-05 DIAGNOSIS — E1169 Type 2 diabetes mellitus with other specified complication: Secondary | ICD-10-CM

## 2023-05-05 DIAGNOSIS — N1832 Chronic kidney disease, stage 3b: Secondary | ICD-10-CM

## 2023-05-05 DIAGNOSIS — E8809 Other disorders of plasma-protein metabolism, not elsewhere classified: Secondary | ICD-10-CM | POA: Insufficient documentation

## 2023-05-05 DIAGNOSIS — F418 Other specified anxiety disorders: Secondary | ICD-10-CM

## 2023-05-05 DIAGNOSIS — D500 Iron deficiency anemia secondary to blood loss (chronic): Secondary | ICD-10-CM

## 2023-05-05 DIAGNOSIS — E1122 Type 2 diabetes mellitus with diabetic chronic kidney disease: Secondary | ICD-10-CM

## 2023-05-05 DIAGNOSIS — K7460 Unspecified cirrhosis of liver: Secondary | ICD-10-CM

## 2023-05-05 DIAGNOSIS — E785 Hyperlipidemia, unspecified: Secondary | ICD-10-CM

## 2023-05-05 DIAGNOSIS — I700 Atherosclerosis of aorta: Secondary | ICD-10-CM | POA: Insufficient documentation

## 2023-05-05 DIAGNOSIS — K7581 Nonalcoholic steatohepatitis (NASH): Secondary | ICD-10-CM | POA: Diagnosis not present

## 2023-05-05 DIAGNOSIS — E46 Unspecified protein-calorie malnutrition: Secondary | ICD-10-CM

## 2023-05-05 LAB — BPAM RBC
Blood Product Expiration Date: 202410132359
ISSUE DATE / TIME: 202409161125
Unit Type and Rh: 6200

## 2023-05-05 LAB — TYPE AND SCREEN
ABO/RH(D): A POS
Antibody Screen: NEGATIVE
Unit division: 0

## 2023-05-05 NOTE — Progress Notes (Addendum)
Location:  [facility] Room Number: 133 Place of Service:  SNF (31)   CODE STATUS: full   Allergies  Allergen Reactions   Latex Rash    Contact rash   Tape Other (See Comments)    Skin sensitivity   Gramineae Pollens Other (See Comments)    Sneezing, Running nose  Other Reaction(s): Other (see comments)    Chief Complaint  Patient presents with   Hospitalization Follow-up    HPI:  She is a 67 year old woman who has been hospitalized from 04-29-23 through 05-04-23. 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 Recurrent grade 4 internal hemorrhoids and coil embolization of the splenic artery on 01-06-23.  She has had a recent hospitalization 03-24-23 through 03-27-23 due to hyperammonemia; uti and ascites. She presented to the ED on 04-29-23 due to increased confusion. She had not been taking her lactulose for 2 days prior to her hospitalization. She also had nausea and vomiting the day prior.  Her chest x-ray was concerning for pneumonia which has been treated with rocephin and azithromycin.  AMS/acuate metabolic encephalopathy/acute hepatic encephalopathy: she is slow to verbally respond; does have some confusion present. Her zoloft  and gabapentin have been placed on hold.  Chronic meningioma: no cva She is here for short term rehab with her goal to return back home with her spouse. She will continue to be followed for her chronic illnesses including:  Anemia in  stage 3b chronic kidney disease/thrombocytopenia/pancytopenia/iron deficiency anemia due to chronic blood loss/thalassemia minor :  Depression with anxiety:    Past Medical History:  Diagnosis Date   Anemia in chronic kidney disease (CKD)  02/07/2022   Anxiety    Arthritis    Ascites of liver    Weekly paracentesis   Cirrhosis of liver (HCC)    CKD (chronic kidney disease) stage 4, GFR 15-29 ml/min (HCC)    GERD (gastroesophageal reflux disease)    Grade IV internal hemorrhoids    Hypertension    NASH (nonalcoholic steatohepatitis)    Neuropathy, diabetic (HCC)    Obesity (BMI 30-39.9)    OSA on CPAP    Pneumonia    Portal hypertension (HCC)    S/P TIPS (transjugular intrahepatic portosystemic shunt)    Thalassemia minor 1992    Past Surgical History:  Procedure Laterality Date   ABDOMINAL HYSTERECTOMY     BREAST BIOPSY Right 02/15/2018   [place] bx 6-6:30 ribbon shape, ONE CORE FRAGMENT WITH FIBROSIS. ONE CORE FRAGMENT WITH PORTION OF A DILATED   BREAST BIOPSY Right 02/15/2018   [place] bx 9:00 heart shape, USUAL DUCTAL HYPERPLASIA   BREAST LUMPECTOMY Right 03/09/2018   Procedure: BREAST LUMPECTOMY x 2;  Surgeon: Sung Amabile, DO;  Location: [facility];  Service: General;  Laterality: Right;   CATARACT EXTRACTION W/PHACO Left 06/11/2016   Procedure: CATARACT EXTRACTION PHACO AND INTRAOCULAR LENS PLACEMENT (IOC);  Surgeon: Sallee Lange, MD;  Location: [facility];  Service: Ophthalmology;  Laterality: Left;  Lot # [id number] H US:01:38.6 AP%:26.4 CDE:44.15   CATARACT EXTRACTION W/PHACO Right 06/15/2018   Procedure: CATARACT EXTRACTION PHACO AND INTRAOCULAR LENS PLACEMENT (IOC);  Surgeon: Galen Manila, MD;  Location: [facility];  Service: Ophthalmology;  Laterality: Right;  [place] 00:38.2 CDE 4.23 Fluid Pack Lot # [id number]   COLONOSCOPIES  COLONOSCOPY WITH PROPOFOL N/A 10/10/2020   Procedure: COLONOSCOPY WITH PROPOFOL;  Surgeon: Regis Bill, MD;  Location: [facility];  Service: Endoscopy;  Laterality: N/A;   COLONOSCOPY WITH PROPOFOL N/A 11/20/2020   Procedure: COLONOSCOPY WITH PROPOFOL;  Surgeon: Regis Bill, MD;  Location: [facility];  Service: Endoscopy;  Laterality: N/A;  DM STAT CBC, BMP COVID  POSITIVE 09/02/2020   COLONOSCOPY WITH PROPOFOL N/A 06/19/2022   Procedure: COLONOSCOPY WITH PROPOFOL;  Surgeon: Regis Bill, MD;  Location: [facility];  Service: Endoscopy;  Laterality: N/A;   CTR     ESOPHAGOGASTRODUODENOSCOPY (EGD) WITH PROPOFOL N/A 10/10/2020   Procedure: ESOPHAGOGASTRODUODENOSCOPY (EGD) WITH PROPOFOL;  Surgeon: Regis Bill, MD;  Location: [facility];  Service: Endoscopy;  Laterality: N/A;  COVID POSITIVE 10/08/2020   FLEXIBLE SIGMOIDOSCOPY N/A 02/06/2022   Procedure: FLEXIBLE SIGMOIDOSCOPY;  Surgeon: Regis Bill, MD;  Location: [facility];  Service: Endoscopy;  Laterality: N/A;  Patient requests anesthesia   FLEXIBLE SIGMOIDOSCOPY N/A 12/19/2022   Procedure: FLEXIBLE SIGMOIDOSCOPY;  Surgeon: Dolores Frame, MD;  Location: [facility];  Service: Gastroenterology;  Laterality: N/A;   IR ANGIOGRAM SELECTIVE EACH ADDITIONAL VESSEL  02/07/2022   IR ANGIOGRAM SELECTIVE EACH ADDITIONAL VESSEL  02/07/2022   IR ANGIOGRAM SELECTIVE EACH ADDITIONAL VESSEL  09/05/2022   IR ANGIOGRAM VISCERAL SELECTIVE  02/07/2022   IR ANGIOGRAM VISCERAL SELECTIVE  09/05/2022   IR CV LINE INJECTION  02/24/2023   IR EMBO ART  VEN HEMORR LYMPH EXTRAV  INC GUIDE ROADMAPPING  09/05/2022   IR EMBO ART  VEN HEMORR LYMPH EXTRAV  INC GUIDE ROADMAPPING  11/05/2022   IR EMBO ARTERIAL NOT HEMORR HEMANG INC GUIDE ROADMAPPING  02/07/2022   IR EMBO TUMOR ORGAN ISCHEMIA INFARCT INC GUIDE ROADMAPPING  01/06/2023   IR IMAGING GUIDED PORT INSERTION  06/13/2022   IR IMAGING GUIDED PORT INSERTION  11/25/2022   IR PARACENTESIS  12/17/2021   IR PARACENTESIS  03/25/2022   IR PARACENTESIS  11/25/2022   IR PARACENTESIS  01/06/2023   IR PARACENTESIS  01/19/2023   IR RADIOLOGIST EVAL & MGMT  03/18/2022   IR RADIOLOGIST EVAL & MGMT  05/07/2022   IR RADIOLOGIST EVAL & MGMT  05/16/2022   IR RADIOLOGIST EVAL & MGMT  10/13/2022   IR RADIOLOGIST EVAL & MGMT  12/12/2022   IR RADIOLOGIST EVAL & MGMT  03/30/2023    IR US GUIDE VASC ACCESS RIGHT  02/07/2022   IR US GUIDE VASC ACCESS RIGHT  09/05/2022   JOINT REPLACEMENT     KNEE SURGERY Right 09/24/2017   plates and pins   PORT-A-CATH REMOVAL N/A 09/23/2022   Procedure: MINOR REMOVAL PORT-A-CATH;  Surgeon: Franky Macho, MD;  Location: [facility];  Service: General;  Laterality: N/A;   TEE WITHOUT CARDIOVERSION N/A 09/23/2022   Procedure: TRANSESOPHAGEAL ECHOCARDIOGRAM (TEE);  Surgeon: Antoine Poche, MD;  Location: [facility];  Service: Endoscopy;  Laterality: N/A;   TOTAL SHOULDER ARTHROPLASTY Right 09/27/2015    Social History   Socioeconomic History   Marital status: Married    Spouse name: Not on file   Number of children: 0   Years of education: Not on file   Highest education level: Not on file  Occupational History   Occupation: EMPLOYED  Tobacco Use   Smoking status: Never   Smokeless tobacco: Never  Vaping Use   Vaping status: Never Used  Substance and Sexual Activity   Alcohol use: No   Drug use: Never   Sexual activity:  Not Currently  Other Topics Concern   Not on file  Social History Narrative   Not on file   Social Determinants of Health   Financial Resource Strain: Low Risk  (04/04/2022)   Overall Financial Resource Strain (CARDIA)    Difficulty of Paying Living Expenses: Not hard at all  Food Insecurity: Patient Declined (03/25/2023)   Hunger Vital Sign    Worried About Running Out of Food in the Last Year: Patient declined    Ran Out of Food in the Last Year: Patient declined  Transportation Needs: Patient Declined (03/25/2023)   PRAPARE - Administrator, Civil Service (Medical): Patient declined    Lack of Transportation (Non-Medical): Patient declined  Physical Activity: Inactive (04/04/2022)   Exercise Vital Sign    Days of Exercise per Week: 0 days    Minutes of Exercise per Session: 0 min  Stress: No Stress Concern Present (04/04/2022)   [employer] of Occupational Health - Occupational Stress  Questionnaire    Feeling of Stress : Only a little  Social Connections: Moderately Isolated (04/04/2022)   Social Connection and Isolation Panel [NHANES]    Frequency of Communication with Friends and Family: More than three times a week    Frequency of Social Gatherings with Friends and Family: Three times a week    Attends Religious Services: Never    Active Member of Clubs or Organizations: No    Attends Banker Meetings: Never    Marital Status: Married  Catering manager Violence: Patient Declined (03/25/2023)   Humiliation, Afraid, Rape, and Kick questionnaire    Fear of Current or Ex-Partner: Patient declined    Emotionally Abused: Patient declined    Physically Abused: Patient declined    Sexually Abused: Patient declined   Family History  Problem Relation Age of Onset   Breast cancer Mother 23   Lymphoma Mother    Diabetes Father    Kidney cancer Father    Heart disease Father    Diabetes Sister    Breast cancer Sister 88      VITAL SIGNS BP (!) 122/55   Pulse 83   Temp 97.7 F (36.5 C)   Ht 5\' 10"  (1.778 m)   Wt 222 lb 12.8 oz (101.1 kg)   BMI 31.97 kg/m   Outpatient Encounter Medications as of 05/05/2023  Medication Sig   acetaminophen (TYLENOL) 500 MG tablet Take 1 tablet (500 mg total) by mouth 2 (two) times daily as needed for mild pain.   cyanocobalamin (VITAMIN B12) 500 MCG tablet Take 1,000 mcg by mouth daily.   lactulose (CHRONULAC) 10 GM/15ML solution Take 30 mLs (20 g total) by mouth 3 (three) times daily.   Multiple Vitamins-Minerals (MULTIVITAMIN WITH MINERALS) tablet Take 1 tablet by mouth daily.   sertraline (ZOLOFT) 50 MG tablet Take 1 tablet (50 mg total) by mouth daily.   torsemide (DEMADEX) 20 MG tablet Take 40 mg by mouth daily.   Vitamin D, Ergocalciferol, (DRISDOL) 1.25 MG (50000 UNIT) CAPS capsule Take 1 capsule by mouth once a week   XIFAXAN 550 MG TABS tablet Take 550 mg by mouth 2 (two) times daily.   [DISCONTINUED]  Cholecalciferol (D 1000) 25 MCG (1000 UT) capsule Take 1,000 Units by mouth daily.   [DISCONTINUED] diclofenac Sodium (VOLTAREN) 1 % GEL Apply 4 g topically 4 (four) times daily. (Patient taking differently: Apply 4 g topically as needed.)   No facility-administered encounter medications on file as of 05/05/2023.  SIGNIFICANT DIAGNOSTIC EXAMS  LABS REVIEWED:  04-29-23: wbc 5.5; hgb 8.1; hct 25.4; mcv 74.5 plt 146; glucose 198; bun 50; creat 1.62; k+ 3.9; na++ 134; ca 8.7; gfr 35; total bili 2.8 protein 6.1 albumin 2.9; ammonia 68  05-02-23: wbc 6.3; hgb 7.5; hct 24.1; mcv 75.8 plt 156; glucose 144; bun 47; creat 1.35; k+ 3.6; na++ 141; ca 9.1; gfr 44; total bili 2.2 protein 6.0 albumin 2.7 ammonia 26  05-03-23; wbc 8.6; hgb 7.2; hct 24.2; mcv 77.3 plt 181; glucose 139; bun 45; creat 1.25; k+ 3.7; na++ 143; ca 8.9; gfr 48; total bili 1.7 protein 5.8 albumin 2.7   Review of Systems  Constitutional:  Negative for malaise/fatigue.  Respiratory:  Negative for cough and shortness of breath.   Cardiovascular:  Negative for chest pain, palpitations and leg swelling.  Gastrointestinal:  Negative for abdominal pain, constipation and heartburn.  Musculoskeletal:  Negative for back pain, joint pain and myalgias.  Skin: Negative.   Neurological:  Negative for dizziness.  Psychiatric/Behavioral:  The patient is not nervous/anxious.     Physical Exam Constitutional:      General: She is not in acute distress.    Appearance: She is well-developed. She is obese. She is not diaphoretic.  Neck:     Thyroid: No thyromegaly.  Cardiovascular:     Rate and Rhythm: Normal rate and regular rhythm.     Pulses: Normal pulses.     Heart sounds: Normal heart sounds.  Pulmonary:     Effort: Pulmonary effort is normal. No respiratory distress.     Breath sounds: Normal breath sounds.  Abdominal:     General: Bowel sounds are normal. There is distension.     Palpations: Abdomen is soft.     Tenderness:  There is no abdominal tenderness.  Genitourinary:    Comments: Incontinent of bladder and bowel Musculoskeletal:        General: Normal range of motion.     Cervical back: Neck supple.     Right lower leg: No edema.     Left lower leg: No edema.  Lymphadenopathy:     Cervical: No cervical adenopathy.  Skin:    General: Skin is warm and dry.  Neurological:     Mental Status: She is alert. Mental status is at baseline.     Comments: Does have some confusion Slow verbal response   Psychiatric:        Mood and Affect: Mood normal.     ASSESSMENT/ PLAN:  TODAY  Acute hepatic encephalopathy/liver cirrhosis secondary to NASH: does have some confusion present. Will continue xifaxan 550 mg twice daily and lactulose 30 cc three times daily; has been with paracentesis weekly; has not done this since August of this year.    2. Anemia in  stage 3b chronic kidney disease/thrombocytopenia/pancytopenia/iron deficiency anemia due to chronic blood loss/thalassemia minor : hgb 7.2; plt 181; did receive one unit packed cells due to hgb 7.2 with hypotension present. Will repeat labs.   3. Depression with anxiety: she is presently taking zoloft 50 mg daily which has been restarted   4. 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 mellitus: bun 45; creat 1.25 gfr 48  5. Type 2 diabetes mellitus with hyperlipidemia: hgb A1c 6.8; will continue to monitor   6. Hypoalbuminemia  due to protein calorie malnutrition: albumin 2.7 will begin prosource 30 mL three times daily   7. Vitamin D deficiency: will continue vitamin D 50,000 units weekly   8.  Bilateral lower extremity edema: will continue torsemide 40 mg daily   9. Aortic atherosclerosis (03-18-23 ct)   Will check bmp and cbc    Synthia Innocent NP [facility]  call [phone]

## 2023-05-06 ENCOUNTER — Non-Acute Institutional Stay (SKILLED_NURSING_FACILITY): Payer: Medicare Other | Admitting: Internal Medicine

## 2023-05-06 ENCOUNTER — Encounter: Payer: Self-pay | Admitting: Internal Medicine

## 2023-05-06 DIAGNOSIS — D649 Anemia, unspecified: Secondary | ICD-10-CM

## 2023-05-06 DIAGNOSIS — K7682 Hepatic encephalopathy: Secondary | ICD-10-CM | POA: Diagnosis not present

## 2023-05-06 DIAGNOSIS — E440 Moderate protein-calorie malnutrition: Secondary | ICD-10-CM

## 2023-05-06 DIAGNOSIS — E1122 Type 2 diabetes mellitus with diabetic chronic kidney disease: Secondary | ICD-10-CM

## 2023-05-06 DIAGNOSIS — K7581 Nonalcoholic steatohepatitis (NASH): Secondary | ICD-10-CM

## 2023-05-06 DIAGNOSIS — D696 Thrombocytopenia, unspecified: Secondary | ICD-10-CM

## 2023-05-06 DIAGNOSIS — N183 Chronic kidney disease, stage 3 unspecified: Secondary | ICD-10-CM

## 2023-05-06 DIAGNOSIS — E46 Unspecified protein-calorie malnutrition: Secondary | ICD-10-CM | POA: Insufficient documentation

## 2023-05-06 DIAGNOSIS — K7460 Unspecified cirrhosis of liver: Secondary | ICD-10-CM

## 2023-05-06 DIAGNOSIS — G4733 Obstructive sleep apnea (adult) (pediatric): Secondary | ICD-10-CM

## 2023-05-06 NOTE — Patient Instructions (Signed)
See assessment and plan under each diagnosis in the problem list and acutely for this visit 

## 2023-05-06 NOTE — Assessment & Plan Note (Signed)
TP 5.8, albumin 2.7. IOW on exam.

## 2023-05-06 NOTE — Assessment & Plan Note (Addendum)
CPAP adherent @ home.

## 2023-05-06 NOTE — Assessment & Plan Note (Addendum)
Some variable somnolence today; recheck ammonia level.

## 2023-05-06 NOTE — Assessment & Plan Note (Signed)
04/29/23 nadir plat # 146, 000; final value 181,000. Staff reports no bleeding dyscrasias at the SNF.

## 2023-05-06 NOTE — Assessment & Plan Note (Addendum)
9/11-9/16/24 final eGFR 48, low Stage 3a CKD. Glucoses 139-198 as IP; current A1c 6.8%.

## 2023-05-06 NOTE — Progress Notes (Unsigned)
NURSING HOME LOCATION:  [facility] ROOM NUMBER:  133 P  CODE STATUS: Full Code   PCP:  Marya Amsler. Anderson MD  This is a comprehensive admission note to this SNFperformed on this date less than 30 days from date of admission. Included are preadmission medical/surgical history; reconciled medication list; family history; social history and comprehensive review of systems.  Corrections and additions to the records were documented. Comprehensive physical exam was also performed. Additionally a clinical summary was entered for each active diagnosis pertinent to this admission in the Problem List to enhance continuity of care.  HPI: She was hospitalized 9/11 - 05/04/2023 with hepatic encephalopathy in the context of Rachel Huber cirrhosis.  The patient remained confuse and was unable to provide any meaningful history; such was obtained from her husband.  Apparently she had not been taking her lactulose for 2 days or or more PTA.  She also experienced nausea and vomiting 1 day PTA and in the presence of 1-2 loose stools.  Apparently her son-in-law had a viral gastroenteritis and her husband 6 question whether she had the same process. In the ED she was febrile with a temperature 100.2.  Chest x-ray was worrisome for bilateral upper lobe airspace disease but the technique exhibited poor inspiration.  Possible mild congestive heart failure was suspected.  Procalcitonin was 0.78 and the patient received Rocephin and azithromycin.  Despite the possible CAP white count was in the range of 5.5-8.6 except for an outlier of 11,000.  She did exhibit microcytic, normochromic anemia with a nadir H/H of 7.1/23.6 necessitating transfusion of 1 unit of packed cells.  Final H/H was 8.5/26.1.  She had minimal thrombocytopenia with a platelet count of 146,000; final platelet count was 181,000.  Past medical and surgical history: Includes history of chronic anemia in the setting of congenital thalassemia  minor; NASH cirrhosis of the liver; diabetes with peripheral neuropathy and CKD; GERD; essential hypertension; and OSA on CPAP.  Social history: She smoked and drank alcohol to a minimal degree only while in college.  Family history: Family history is positive for diabetes as well as cirrhosis in her maternal grandfather.   Review of systems: When asked why she had been hospitalized her response was "my ammonia levels too high because I stopped taking lactulose because I did not think I needed it anymore."  She stated that when that happens "I get somewhat stupid."  As "Rachel Huber, but I am feeling better."  She question the possible genetic etiology but no she focused on family history in a maternal grandfather who had cirrhosis and did not drink significant amounts of alcohol.  She previously had poorly controlled diabetes stating that her A1c was 9%.  The most recent A1c has been less than 6%.  She describes occasional dysphagia.  She has had pain in the left forearm and hand which she states has been present since she was hospitalized for spleen embolism and May 2024.  She states that pain has responded to topical Voltaren gel.  She states that the enlarged spleen was in the context of thalassemia from childhood.   Constitutional: No fever, significant weight change, fatigue  Eyes: No redness, discharge, pain, vision change ENT/mouth: No nasal congestion, purulent discharge, earache, change in hearing, sore throat  Cardiovascular: No chest pain, palpitations, paroxysmal nocturnal dyspnea, claudication, edema  Respiratory: No cough, sputum production, hemoptysis, DOE, significant snoring, apnea Gastrointestinal: No heartburn, dysphagia, abdominal pain, nausea /vomiting, rectal bleeding, melena, change in bowels Genitourinary: No dysuria,  hematuria, pyuria, incontinence, nocturia Musculoskeletal: No joint stiffness, joint swelling, weakness, pain Dermatologic: No rash, pruritus, change in appearance of  skin Neurologic: No dizziness, headache, syncope, seizures, numbness, tingling Psychiatric: No significant anxiety, depression, insomnia, anorexia Endocrine: No change in hair/skin/nails, excessive thirst, excessive hunger, excessive urination  Hematologic/lymphatic: No significant bruising, lymphadenopathy, abnormal bleeding Allergy/immunology: No itchy/watery eyes, significant sneezing, urticaria, angioedema  Physical exam:  Pertinent or positive findings: She was interactive and communicative and appears intelligent.  Responses are slow.  She has bilateral of the lids laterally and upper lids are somewhat puffy.  Eyebrows are decreased laterally.  There is fine hirsutism over the chin.  She had a grade 1/2 systolic murmur at the right base.  There was occasional premature beat.  Breath sounds are decreased.  Abdomen was protuberant; there was no evidence of caput medusa.  Dorsalis pedis pulses are stronger than posterior tibial pulses.  She had subtle clubbing of the nailbeds.  There is mild hypopigmented and hyperpigmented station and ecchymosis over the right forearm.  There is a small tattoo over the right medial forearm.  Interosseous wasting is noted.  She.  There is significant edema and erythema of the left index PIP and left third PIP.  There is faint resolving ecchymosis at the anterior hairline.  There is a tiny eschar at the tip of the nose.  She has dry skin over the lower extremities. General appearance: Adequately nourished; no acute distress, increased work of breathing is present.   Lymphatic: No lymphadenopathy about the head, neck, axilla. Eyes: No conjunctival inflammation or lid edema is present. There is no scleral icterus. Ears:  External ear exam shows no significant lesions or deformities.   Nose:  External nasal examination shows no deformity or inflammation. Nasal mucosa are pink and moist without lesions, exudates Oral exam: Lips and gums are healthy appearing.There is no  oropharyngeal erythema or exudate. Neck:  No thyromegaly, masses, tenderness noted.    Heart:  Normal rate and regular rhythm. S1 and S2 normal without gallop, murmur, click, rub.  Lungs: Chest clear to auscultation without wheezes, rhonchi, rales, rubs. Abdomen: Bowel sounds are normal.  Abdomen is soft and nontender with no organomegaly, hernias, masses. GU: Deferred  Extremities:  No cyanosis, clubbing, edema. Neurologic exam:  Strength equal  in upper & lower extremities. Balance, Rhomberg, finger to nose testing could not be completed due to clinical state Deep tendon reflexes are equal Skin: Warm & dry w/o tenting. No significant lesions or rash.  See clinical summary under each active problem in the Problem List with associated updated therapeutic plan

## 2023-05-06 NOTE — Assessment & Plan Note (Signed)
AST peak 53, final value 34. ALT WNL serially.

## 2023-05-06 NOTE — Assessment & Plan Note (Signed)
Nadir H/H 7.1/23.6; 8.5/26.6 S/P 1 u pc. Monitor for any bleeding dyscrasias.

## 2023-05-07 ENCOUNTER — Encounter (HOSPITAL_COMMUNITY): Payer: Medicare Other | Admitting: Occupational Therapy

## 2023-05-07 ENCOUNTER — Ambulatory Visit (HOSPITAL_COMMUNITY): Payer: Medicare Other

## 2023-05-11 ENCOUNTER — Inpatient Hospital Stay: Payer: Medicare Other

## 2023-05-11 ENCOUNTER — Ambulatory Visit (HOSPITAL_COMMUNITY)
Admission: RE | Admit: 2023-05-11 | Discharge: 2023-05-11 | Disposition: A | Discharge status: Home / Self Care | Payer: Medicare Other | Source: Ambulatory Visit | Attending: Gastroenterology | Admitting: Gastroenterology

## 2023-05-11 ENCOUNTER — Encounter (HOSPITAL_COMMUNITY): Payer: Self-pay

## 2023-05-11 ENCOUNTER — Encounter: Payer: Self-pay | Admitting: Adult Health

## 2023-05-11 ENCOUNTER — Other Ambulatory Visit (HOSPITAL_COMMUNITY)
Admission: RE | Admit: 2023-05-11 | Discharge: 2023-05-11 | Disposition: A | Discharge status: Home / Self Care | Payer: Medicare Other | Source: Skilled Nursing Facility | Attending: Adult Health | Admitting: Adult Health

## 2023-05-11 ENCOUNTER — Non-Acute Institutional Stay (SKILLED_NURSING_FACILITY): Payer: Medicare Other | Admitting: Adult Health

## 2023-05-11 ENCOUNTER — Other Ambulatory Visit: Payer: Self-pay | Admitting: Gastroenterology

## 2023-05-11 DIAGNOSIS — K7682 Hepatic encephalopathy: Secondary | ICD-10-CM

## 2023-05-11 DIAGNOSIS — E876 Hypokalemia: Secondary | ICD-10-CM

## 2023-05-11 DIAGNOSIS — M1A30X0 Chronic gout due to renal impairment, unspecified site, without tophus (tophi): Secondary | ICD-10-CM

## 2023-05-11 DIAGNOSIS — D61818 Other pancytopenia: Secondary | ICD-10-CM

## 2023-05-11 DIAGNOSIS — K7460 Unspecified cirrhosis of liver: Secondary | ICD-10-CM

## 2023-05-11 LAB — AMMONIA: Ammonia: 68 umol/L — ABNORMAL HIGH (ref 9–35)

## 2023-05-11 LAB — BASIC METABOLIC PANEL
Anion gap: 10 (ref 5–15)
BUN: 40 mg/dL — ABNORMAL HIGH (ref 8–23)
CO2: 22 mmol/L (ref 22–32)
Calcium: 8.3 mg/dL — ABNORMAL LOW (ref 8.9–10.3)
Chloride: 101 mmol/L (ref 98–111)
Creatinine, Ser: 1.11 mg/dL — ABNORMAL HIGH (ref 0.44–1.00)
GFR, Estimated: 55 mL/min — ABNORMAL LOW (ref >60)
Glucose, Bld: 194 mg/dL — ABNORMAL HIGH (ref 70–99)
Potassium: 2.6 mmol/L — CL (ref 3.5–5.1)
Sodium: 133 mmol/L — ABNORMAL LOW (ref 135–145)

## 2023-05-11 LAB — URIC ACID: Uric Acid, Serum: 13.5 mg/dL — ABNORMAL HIGH (ref 2.5–7.1)

## 2023-05-11 LAB — CBC
HCT: 24.8 % — ABNORMAL LOW (ref 36.0–46.0)
Hemoglobin: 8.1 g/dL — ABNORMAL LOW (ref 12.0–15.0)
MCH: 24 pg — ABNORMAL LOW (ref 26.0–34.0)
MCHC: 32.7 g/dL (ref 30.0–36.0)
MCV: 73.4 fL — ABNORMAL LOW (ref 80.0–100.0)
Platelets: 233 10*3/uL (ref 150–400)
RBC: 3.38 MIL/uL — ABNORMAL LOW (ref 3.87–5.11)
RDW: 23.3 % — ABNORMAL HIGH (ref 11.5–15.5)
WBC: 4.9 10*3/uL (ref 4.0–10.5)
nRBC: 0 % (ref 0.0–0.2)

## 2023-05-11 MED ORDER — LIDOCAINE HCL (PF) 2 % IJ SOLN
INTRAMUSCULAR | Status: AC
  Filled 2023-05-11: qty 20

## 2023-05-11 NOTE — Progress Notes (Unsigned)
Location:  [facility] Room Number: 133 Place of Service:  SNF (31)   CODE STATUS: full   Allergies  Allergen Reactions  . Latex Rash    Contact rash  . Tape Other (See Comments)    Skin sensitivity  . Gramineae Pollens Other (See Comments)    Sneezing, Running nose  Other Reaction(s): Other (see comments)    Chief Complaint  Patient presents with  . Acute Visit    Follow up labs     HPI:  She has hypokalemia at 2.6; has elevated uric acid level of 13.5; has an elevated ammonia level of 58. She is alert; but has a delayed response. There are no reports of joint redness or pain present. She is having 4-5 stools per day.   Past Medical History:  Diagnosis Date  . Anemia in chronic kidney disease (CKD) 02/07/2022  . Anxiety   . Arthritis   . Ascites of liver    Weekly paracentesis  . Cirrhosis of liver (HCC)   . CKD (chronic kidney disease) stage 4, GFR 15-29 ml/min (HCC)   . GERD (gastroesophageal reflux disease)   . Grade IV internal hemorrhoids   . Hypertension   . NASH (nonalcoholic steatohepatitis)   . Neuropathy, diabetic (HCC)   . Obesity (BMI 30-39.9)   . OSA on CPAP   . Pneumonia   . Portal hypertension (HCC)   . S/P TIPS (transjugular intrahepatic portosystemic shunt)   . Thalassemia minor 1992    Past Surgical History:  Procedure Laterality Date  . ABDOMINAL HYSTERECTOMY    . BREAST BIOPSY Right 02/15/2018   [place] bx 6-6:30 ribbon shape, ONE CORE FRAGMENT WITH FIBROSIS. ONE CORE FRAGMENT WITH PORTION OF A DILATED  . BREAST BIOPSY Right 02/15/2018   [place] bx 9:00 heart shape, USUAL DUCTAL HYPERPLASIA  . BREAST LUMPECTOMY Right 03/09/2018   Procedure: BREAST LUMPECTOMY x 2;  Surgeon: Sung Amabile, DO;  Location: [facility] ORS;  Service: General;  Laterality: Right;  . CATARACT EXTRACTION W/PHACO Left 06/11/2016   Procedure: CATARACT EXTRACTION PHACO AND INTRAOCULAR LENS PLACEMENT (IOC);  Surgeon: Sallee Lange, MD;  Location: [facility]  ORS;  Service: Ophthalmology;  Laterality: Left;  Lot # [id number] H US:01:38.6 AP%:26.4 CDE:44.15  . CATARACT EXTRACTION W/PHACO Right 06/15/2018   Procedure: CATARACT EXTRACTION PHACO AND INTRAOCULAR LENS PLACEMENT (IOC);  Surgeon: Galen Manila, MD;  Location: [facility] ORS;  Service: Ophthalmology;  Laterality: Right;  [place] 00:38.2 CDE 4.23 Fluid Pack Lot # [id number]  . COLONOSCOPIES    . COLONOSCOPY WITH PROPOFOL N/A 10/10/2020   Procedure: COLONOSCOPY WITH PROPOFOL;  Surgeon: Regis Bill, MD;  Location: [facility] ENDOSCOPY;  Service: Endoscopy;  Laterality: N/A;  . COLONOSCOPY WITH PROPOFOL N/A 11/20/2020   Procedure: COLONOSCOPY WITH PROPOFOL;  Surgeon: Regis Bill, MD;  Location: [facility] ENDOSCOPY;  Service: Endoscopy;  Laterality: N/A;  DM STAT CBC, BMP COVID POSITIVE 09/02/2020  . COLONOSCOPY WITH PROPOFOL N/A 06/19/2022   Procedure: COLONOSCOPY WITH PROPOFOL;  Surgeon: Regis Bill, MD;  Location: [facility] ENDOSCOPY;  Service: Endoscopy;  Laterality: N/A;  . CTR    . ESOPHAGOGASTRODUODENOSCOPY (EGD) WITH PROPOFOL N/A 10/10/2020   Procedure: ESOPHAGOGASTRODUODENOSCOPY (EGD) WITH PROPOFOL;  Surgeon: Regis Bill, MD;  Location: [facility] ENDOSCOPY;  Service: Endoscopy;  Laterality: N/A;  COVID POSITIVE 10/08/2020  . FLEXIBLE SIGMOIDOSCOPY N/A 02/06/2022   Procedure: FLEXIBLE SIGMOIDOSCOPY;  Surgeon: Regis Bill, MD;  Location: [facility] ENDOSCOPY;  Service: Endoscopy;  Laterality: N/A;  Patient requests anesthesia  .  FLEXIBLE SIGMOIDOSCOPY N/A 12/19/2022   Procedure: FLEXIBLE SIGMOIDOSCOPY;  Surgeon: Dolores Frame, MD;  Location: [facility];  Service: Gastroenterology;  Laterality: N/A;  . IR ANGIOGRAM SELECTIVE EACH ADDITIONAL VESSEL  02/07/2022  . IR ANGIOGRAM SELECTIVE EACH ADDITIONAL VESSEL  02/07/2022  . IR ANGIOGRAM SELECTIVE EACH ADDITIONAL VESSEL  09/05/2022  . IR ANGIOGRAM VISCERAL SELECTIVE  02/07/2022  . IR ANGIOGRAM VISCERAL SELECTIVE  09/05/2022  . IR CV  LINE INJECTION  02/24/2023  . IR EMBO ART  VEN HEMORR LYMPH EXTRAV  INC GUIDE ROADMAPPING  09/05/2022  . IR EMBO ART  VEN HEMORR LYMPH EXTRAV  INC GUIDE ROADMAPPING  11/05/2022  . IR EMBO ARTERIAL NOT HEMORR HEMANG INC GUIDE ROADMAPPING  02/07/2022  . IR EMBO TUMOR ORGAN ISCHEMIA INFARCT INC GUIDE ROADMAPPING  01/06/2023  . IR IMAGING GUIDED PORT INSERTION  06/13/2022  . IR IMAGING GUIDED PORT INSERTION  11/25/2022  . IR PARACENTESIS  12/17/2021  . IR PARACENTESIS  03/25/2022  . IR PARACENTESIS  11/25/2022  . IR PARACENTESIS  01/06/2023  . IR PARACENTESIS  01/19/2023  . IR RADIOLOGIST EVAL & MGMT  03/18/2022  . IR RADIOLOGIST EVAL & MGMT  05/07/2022  . IR RADIOLOGIST EVAL & MGMT  05/16/2022  . IR RADIOLOGIST EVAL & MGMT  10/13/2022  . IR RADIOLOGIST EVAL & MGMT  12/12/2022  . IR RADIOLOGIST EVAL & MGMT  03/30/2023  . IR US GUIDE VASC ACCESS RIGHT  02/07/2022  . IR US GUIDE VASC ACCESS RIGHT  09/05/2022  . JOINT REPLACEMENT    . KNEE SURGERY Right 09/24/2017   plates and pins  . PORT-A-CATH REMOVAL N/A 09/23/2022   Procedure: MINOR REMOVAL PORT-A-CATH;  Surgeon: Franky Macho, MD;  Location: [facility] ORS;  Service: General;  Laterality: N/A;  . TEE WITHOUT CARDIOVERSION N/A 09/23/2022   Procedure: TRANSESOPHAGEAL ECHOCARDIOGRAM (TEE);  Surgeon: Antoine Poche, MD;  Location: [facility] ORS;  Service: Endoscopy;  Laterality: N/A;  . TOTAL SHOULDER ARTHROPLASTY Right 09/27/2015    Social History   Socioeconomic History  . Marital status: Married    Spouse name: Not on file  . Number of children: 0  . Years of education: Not on file  . Highest education level: Not on file  Occupational History  . Occupation: EMPLOYED  Tobacco Use  . Smoking status: Former    Types: Cigarettes  . Smokeless tobacco: Never  . Tobacco comments:    Smoked "some" in college  Vaping Use  . Vaping status: Never Used  Substance and Sexual Activity  . Alcohol use: No    Comment: minimal alcohol intake in college  . Drug use: Never  .  Sexual activity: Not Currently  Other Topics Concern  . Not on file  Social History Narrative  . Not on file   Social Determinants of Health   Financial Resource Strain: Low Risk  (04/04/2022)   Overall Financial Resource Strain (CARDIA)   . Difficulty of Paying Living Expenses: Not hard at all  Food Insecurity: Patient Declined (03/25/2023)   Hunger Vital Sign   . Worried About Programme researcher, broadcasting/film/video in the Last Year: Patient declined   . Ran Out of Food in the Last Year: Patient declined  Transportation Needs: Patient Declined (03/25/2023)   PRAPARE - Transportation   . Lack of Transportation (Medical): Patient declined   . Lack of Transportation (Non-Medical): Patient declined  Physical Activity: Inactive (04/04/2022)   Exercise Vital Sign   . Days of Exercise per Week: 0  days   . Minutes of Exercise per Session: 0 min  Stress: No Stress Concern Present (04/04/2022)   [employer] of Occupational Health - Occupational Stress Questionnaire   . Feeling of Stress : Only a little  Social Connections: Moderately Isolated (04/04/2022)   Social Connection and Isolation Panel [NHANES]   . Frequency of Communication with Friends and Family: More than three times a week   . Frequency of Social Gatherings with Friends and Family: Three times a week   . Attends Religious Services: Never   . Active Member of Clubs or Organizations: No   . Attends Banker Meetings: Never   . Marital Status: Married  Catering manager Violence: Patient Declined (03/25/2023)   Humiliation, Afraid, Rape, and Kick questionnaire   . Fear of Current or Ex-Partner: Patient declined   . Emotionally Abused: Patient declined   . Physically Abused: Patient declined   . Sexually Abused: Patient declined   Family History  Problem Relation Age of Onset  . Breast cancer Mother 73  . Lymphoma Mother   . Diabetes Father   . Kidney cancer Father   . Heart disease Father   . Diabetes Sister   . Breast cancer  Sister 78      VITAL SIGNS BP 120/68   Pulse 78   Temp (!) 96.8 F (36 C)   Resp 20   Ht 5\' 10"  (1.778 m)   Wt 215 lb 6.4 oz (97.7 kg)   SpO2 96%   BMI 30.91 kg/m   Outpatient Encounter Medications as of 05/11/2023  Medication Sig  . acetaminophen (TYLENOL) 500 MG tablet Take 1 tablet (500 mg total) by mouth 2 (two) times daily as needed for mild pain.  . cyanocobalamin (VITAMIN B12) 500 MCG tablet Take 1,000 mcg by mouth daily.  Marland Kitchen lactulose (CHRONULAC) 10 GM/15ML solution Take 30 mLs (20 g total) by mouth 3 (three) times daily.  . Multiple Vitamins-Minerals (MULTIVITAMIN WITH MINERALS) tablet Take 1 tablet by mouth daily.  . sertraline (ZOLOFT) 50 MG tablet Take 1 tablet (50 mg total) by mouth daily.  Marland Kitchen torsemide (DEMADEX) 20 MG tablet Take 40 mg by mouth daily.  . Vitamin D, Ergocalciferol, (DRISDOL) 1.25 MG (50000 UNIT) CAPS capsule Take 1 capsule by mouth once a week  . XIFAXAN 550 MG TABS tablet Take 550 mg by mouth 2 (two) times daily.   No facility-administered encounter medications on file as of 05/11/2023.     SIGNIFICANT DIAGNOSTIC EXAMS   LABS REVIEWED:  04-29-23: wbc 5.5; hgb 8.1; hct 25.4; mcv 74.5 plt 146; glucose 198; bun 50; creat 1.62; k+ 3.9; na++ 134; ca 8.7; gfr 35; total bili 2.8 protein 6.1 albumin 2.9; ammonia 68  05-02-23: wbc 6.3; hgb 7.5; hct 24.1; mcv 75.8 plt 156; glucose 144; bun 47; creat 1.35; k+ 3.6; na++ 141; ca 9.1; gfr 44; total bili 2.2 protein 6.0 albumin 2.7 ammonia 26  05-03-23; wbc 8.6; hgb 7.2; hct 24.2; mcv 77.3 plt 181; glucose 139; bun 45; creat 1.25; k+ 3.7; na++ 143; ca 8.9; gfr 48; total bili 1.7 protein 5.8 albumin 2.7   TODAY  05-11-23: wbc 4.9; hgb 8.1; hct 24.8; mcv 73.4 plt 233; glucose 194; bun 40; creat 1.11; k+ 2.6; na++ 133; ca 8.3 gfr 55 ammonia 68; uric acid 13.5   Review of Systems  Constitutional:  Negative for malaise/fatigue.  Respiratory:  Negative for cough and shortness of breath.   Cardiovascular:  Negative  for chest pain, palpitations and  leg swelling.  Gastrointestinal:  Negative for abdominal pain, constipation and heartburn.  Musculoskeletal:  Negative for back pain, joint pain and myalgias.  Skin: Negative.   Neurological:  Negative for dizziness.  Psychiatric/Behavioral:  The patient is not nervous/anxious.     Physical Exam Constitutional:      General: She is not in acute distress.    Appearance: She is well-developed. She is not diaphoretic.  Neck:     Thyroid: No thyromegaly.  Cardiovascular:     Rate and Rhythm: Normal rate and regular rhythm.     Pulses: Normal pulses.     Heart sounds: Murmur heard.  Pulmonary:     Effort: Pulmonary effort is normal. No respiratory distress.     Breath sounds: Normal breath sounds.  Abdominal:     General: Bowel sounds are normal. There is no distension.     Palpations: Abdomen is soft.     Tenderness: There is no abdominal tenderness.  Musculoskeletal:        General: Normal range of motion.     Cervical back: Neck supple.     Right lower leg: No edema.     Left lower leg: No edema.  Lymphadenopathy:     Cervical: No cervical adenopathy.  Skin:    General: Skin is warm and dry.  Neurological:     Mental Status: She is alert. Mental status is at baseline.     Comments: Does have some confusion Slow verbal response 05-05-23: BCAT 37/50  Psychiatric:        Mood and Affect: Mood normal.      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

## 2023-05-11 NOTE — Progress Notes (Signed)
Patient presents today for PF/Lab.  Patient is in satisfactory condition with no new complaints voiced.  Vital signs are stable.  RN noted that labs were drawn at the LTC facility that patient is currently residing at this morning.  Hemoglobin on 05/11/23 at 0630 was 8.1.  RN spoke with charge nurse (Brandy Presnell, RN) and it was decided that more labs were not needed as patient just had a CBC this morning.  No blood products needed.  Patient left via wheelchair in stable condition.

## 2023-05-12 ENCOUNTER — Other Ambulatory Visit (HOSPITAL_COMMUNITY)
Admission: RE | Admit: 2023-05-12 | Discharge: 2023-05-12 | Disposition: A | Discharge status: Home / Self Care | Payer: Medicare Other | Source: Skilled Nursing Facility | Attending: Adult Health | Admitting: Adult Health

## 2023-05-12 ENCOUNTER — Encounter: Payer: Self-pay | Admitting: Adult Health

## 2023-05-12 ENCOUNTER — Non-Acute Institutional Stay (SKILLED_NURSING_FACILITY): Payer: Medicare Other | Admitting: Adult Health

## 2023-05-12 ENCOUNTER — Inpatient Hospital Stay (HOSPITAL_COMMUNITY): Admission: RE | Admit: 2023-05-12 | Payer: Medicare Other | Source: Ambulatory Visit

## 2023-05-12 ENCOUNTER — Ambulatory Visit: Payer: Medicare Other

## 2023-05-12 DIAGNOSIS — M1030 Gout due to renal impairment, unspecified site: Secondary | ICD-10-CM | POA: Insufficient documentation

## 2023-05-12 DIAGNOSIS — E876 Hypokalemia: Secondary | ICD-10-CM | POA: Diagnosis not present

## 2023-05-12 LAB — POTASSIUM: Potassium: 3.4 mmol/L — ABNORMAL LOW (ref 3.5–5.1)

## 2023-05-12 NOTE — Progress Notes (Unsigned)
Location:  [facility] Room Number: 133 Place of Service:  SNF (31)   CODE STATUS: full   Allergies  Allergen Reactions   Latex Rash    Contact rash   Tape Other (See Comments)    Skin sensitivity   Gramineae Pollens Other (See Comments)    Sneezing, Running nose  Other Reaction(s): Other (see comments)    Chief Complaint  Patient presents with   Acute Visit    Follow up labs     HPI:  Her k+ remains low at 3.4.  she has been started on colchicine and has had her lactulose increased to 30 gm three times daily due to elevated ammonia level.   Past Medical History:  Diagnosis Date   Anemia in chronic kidney disease (CKD) 02/07/2022   Anxiety    Arthritis    Ascites of liver    Weekly paracentesis   Cirrhosis of liver (HCC)    CKD (chronic kidney disease) stage 4, GFR 15-29 ml/min (HCC)    GERD (gastroesophageal reflux disease)    Grade IV internal hemorrhoids    Hypertension    NASH (nonalcoholic steatohepatitis)    Neuropathy, diabetic (HCC)    Obesity (BMI 30-39.9)    OSA on CPAP    Pneumonia    Portal hypertension (HCC)    S/P TIPS (transjugular intrahepatic portosystemic shunt)    Thalassemia minor 1992    Past Surgical History:  Procedure Laterality Date   ABDOMINAL HYSTERECTOMY     BREAST BIOPSY Right 02/15/2018   [place] bx 6-6:30 ribbon shape, ONE CORE FRAGMENT WITH FIBROSIS. ONE CORE FRAGMENT WITH PORTION OF A DILATED   BREAST BIOPSY Right 02/15/2018   [place] bx 9:00 heart shape, USUAL DUCTAL HYPERPLASIA   BREAST LUMPECTOMY Right 03/09/2018   Procedure: BREAST LUMPECTOMY x 2;  Surgeon: Sung Amabile, DO;  Location: [facility] ORS;  Service: General;  Laterality: Right;   CATARACT EXTRACTION W/PHACO Left 06/11/2016   Procedure: CATARACT EXTRACTION PHACO AND INTRAOCULAR LENS PLACEMENT (IOC);  Surgeon: Sallee Lange, MD;  Location: [facility] ORS;  Service: Ophthalmology;  Laterality: Left;  Lot # [id number] H US:01:38.6 AP%:26.4 CDE:44.15    CATARACT EXTRACTION W/PHACO Right 06/15/2018   Procedure: CATARACT EXTRACTION PHACO AND INTRAOCULAR LENS PLACEMENT (IOC);  Surgeon: Galen Manila, MD;  Location: [facility] ORS;  Service: Ophthalmology;  Laterality: Right;  [place] 00:38.2 CDE 4.23 Fluid Pack Lot # [id number]   COLONOSCOPIES     COLONOSCOPY WITH PROPOFOL N/A 10/10/2020   Procedure: COLONOSCOPY WITH PROPOFOL;  Surgeon: Regis Bill, MD;  Location: [facility] ENDOSCOPY;  Service: Endoscopy;  Laterality: N/A;   COLONOSCOPY WITH PROPOFOL N/A 11/20/2020   Procedure: COLONOSCOPY WITH PROPOFOL;  Surgeon: Regis Bill, MD;  Location: [facility] ENDOSCOPY;  Service: Endoscopy;  Laterality: N/A;  DM STAT CBC, BMP COVID POSITIVE 09/02/2020   COLONOSCOPY WITH PROPOFOL N/A 06/19/2022   Procedure: COLONOSCOPY WITH PROPOFOL;  Surgeon: Regis Bill, MD;  Location: [facility] ENDOSCOPY;  Service: Endoscopy;  Laterality: N/A;   CTR     ESOPHAGOGASTRODUODENOSCOPY (EGD) WITH PROPOFOL N/A 10/10/2020   Procedure: ESOPHAGOGASTRODUODENOSCOPY (EGD) WITH PROPOFOL;  Surgeon: Regis Bill, MD;  Location: [facility] ENDOSCOPY;  Service: Endoscopy;  Laterality: N/A;  COVID POSITIVE 10/08/2020   FLEXIBLE SIGMOIDOSCOPY N/A 02/06/2022   Procedure: FLEXIBLE SIGMOIDOSCOPY;  Surgeon: Regis Bill, MD;  Location: [facility] ENDOSCOPY;  Service: Endoscopy;  Laterality: N/A;  Patient requests anesthesia   FLEXIBLE SIGMOIDOSCOPY N/A 12/19/2022   Procedure: FLEXIBLE SIGMOIDOSCOPY;  Surgeon: Dolores Frame,  MD;  Location: [facility];  Service: Gastroenterology;  Laterality: N/A;   IR ANGIOGRAM SELECTIVE EACH ADDITIONAL VESSEL  02/07/2022   IR ANGIOGRAM SELECTIVE EACH ADDITIONAL VESSEL  02/07/2022   IR ANGIOGRAM SELECTIVE EACH ADDITIONAL VESSEL  09/05/2022   IR ANGIOGRAM VISCERAL SELECTIVE  02/07/2022   IR ANGIOGRAM VISCERAL SELECTIVE  09/05/2022   IR CV LINE INJECTION  02/24/2023   IR EMBO ART  VEN HEMORR LYMPH EXTRAV  INC GUIDE ROADMAPPING  09/05/2022   IR EMBO ART  VEN  HEMORR LYMPH EXTRAV  INC GUIDE ROADMAPPING  11/05/2022   IR EMBO ARTERIAL NOT HEMORR HEMANG INC GUIDE ROADMAPPING  02/07/2022   IR EMBO TUMOR ORGAN ISCHEMIA INFARCT INC GUIDE ROADMAPPING  01/06/2023   IR IMAGING GUIDED PORT INSERTION  06/13/2022   IR IMAGING GUIDED PORT INSERTION  11/25/2022   IR PARACENTESIS  12/17/2021   IR PARACENTESIS  03/25/2022   IR PARACENTESIS  11/25/2022   IR PARACENTESIS  01/06/2023   IR PARACENTESIS  01/19/2023   IR RADIOLOGIST EVAL & MGMT  03/18/2022   IR RADIOLOGIST EVAL & MGMT  05/07/2022   IR RADIOLOGIST EVAL & MGMT  05/16/2022   IR RADIOLOGIST EVAL & MGMT  10/13/2022   IR RADIOLOGIST EVAL & MGMT  12/12/2022   IR RADIOLOGIST EVAL & MGMT  03/30/2023   IR US GUIDE VASC ACCESS RIGHT  02/07/2022   IR US GUIDE VASC ACCESS RIGHT  09/05/2022   JOINT REPLACEMENT     KNEE SURGERY Right 09/24/2017   plates and pins   PORT-A-CATH REMOVAL N/A 09/23/2022   Procedure: MINOR REMOVAL PORT-A-CATH;  Surgeon: Franky Macho, MD;  Location: [facility]S;  Service: General;  Laterality: N/A;   TEE WITHOUT CARDIOVERSION N/A 09/23/2022   Procedure: TRANSESOPHAGEAL ECHOCARDIOGRAM (TEE);  Surgeon: Antoine Poche, MD;  Location: [facility] ORS;  Service: Endoscopy;  Laterality: N/A;   TOTAL SHOULDER ARTHROPLASTY Right 09/27/2015    Social History   Socioeconomic History   Marital status: Married    Spouse name: Not on file   Number of children: 0   Years of education: Not on file   Highest education level: Not on file  Occupational History   Occupation: EMPLOYED  Tobacco Use   Smoking status: Former    Types: Cigarettes   Smokeless tobacco: Never   Tobacco comments:    Smoked "some" in college  Vaping Use   Vaping status: Never Used  Substance and Sexual Activity   Alcohol use: No    Comment: minimal alcohol intake in college   Drug use: Never   Sexual activity: Not Currently  Other Topics Concern   Not on file  Social History Narrative   Not on file   Social Determinants of Health    Financial Resource Strain: Low Risk  (04/04/2022)   Overall Financial Resource Strain (CARDIA)    Difficulty of Paying Living Expenses: Not hard at all  Food Insecurity: Patient Declined (03/25/2023)   Hunger Vital Sign    Worried About Running Out of Food in the Last Year: Patient declined    Ran Out of Food in the Last Year: Patient declined  Transportation Needs: Patient Declined (03/25/2023)   PRAPARE - Administrator, Civil Service (Medical): Patient declined    Lack of Transportation (Non-Medical): Patient declined  Physical Activity: Inactive (04/04/2022)   Exercise Vital Sign    Days of Exercise per Week: 0 days    Minutes of Exercise per Session: 0 min  Stress: No  Stress Concern Present (04/04/2022)   [employer] of Occupational Health - Occupational Stress Questionnaire    Feeling of Stress : Only a little  Social Connections: Moderately Isolated (04/04/2022)   Social Connection and Isolation Panel [NHANES]    Frequency of Communication with Friends and Family: More than three times a week    Frequency of Social Gatherings with Friends and Family: Three times a week    Attends Religious Services: Never    Active Member of Clubs or Organizations: No    Attends Banker Meetings: Never    Marital Status: Married  Catering manager Violence: Patient Declined (03/25/2023)   Humiliation, Afraid, Rape, and Kick questionnaire    Fear of Current or Ex-Partner: Patient declined    Emotionally Abused: Patient declined    Physically Abused: Patient declined    Sexually Abused: Patient declined   Family History  Problem Relation Age of Onset   Breast cancer Mother 43   Lymphoma Mother    Diabetes Father    Kidney cancer Father    Heart disease Father    Diabetes Sister    Breast cancer Sister 35      VITAL SIGNS BP (!) 112/55   Pulse 69   Temp 98.6 F (37 C)   Resp 20   Ht 5\' 10"  (1.778 m)   Wt 206 lb 6.4 oz (93.6 kg)   SpO2 98%   BMI 29.62  kg/m   Outpatient Encounter Medications as of 05/12/2023  Medication Sig   acetaminophen (TYLENOL) 500 MG tablet Take 1 tablet (500 mg total) by mouth 2 (two) times daily as needed for mild pain.   colchicine 0.6 MG tablet Take 0.6 mg by mouth 2 (two) times daily.   cyanocobalamin (VITAMIN B12) 500 MCG tablet Take 1,000 mcg by mouth daily.   lactulose (CHRONULAC) 10 GM/15ML solution Take 30 g by mouth 3 (three) times daily.   Multiple Vitamins-Minerals (MULTIVITAMIN WITH MINERALS) tablet Take 1 tablet by mouth daily.   sertraline (ZOLOFT) 50 MG tablet Take 1 tablet (50 mg total) by mouth daily.   torsemide (DEMADEX) 20 MG tablet Take 40 mg by mouth daily.   Vitamin D, Ergocalciferol, (DRISDOL) 1.25 MG (50000 UNIT) CAPS capsule Take 1 capsule by mouth once a week   XIFAXAN 550 MG TABS tablet Take 550 mg by mouth 2 (two) times daily.   No facility-administered encounter medications on file as of 05/12/2023.     SIGNIFICANT DIAGNOSTIC EXAMS  LABS REVIEWED:  04-29-23: wbc 5.5; hgb 8.1; hct 25.4; mcv 74.5 plt 146; glucose 198; bun 50; creat 1.62; k+ 3.9; na++ 134; ca 8.7; gfr 35; total bili 2.8 protein 6.1 albumin 2.9; ammonia 68  05-02-23: wbc 6.3; hgb 7.5; hct 24.1; mcv 75.8 plt 156; glucose 144; bun 47; creat 1.35; k+ 3.6; na++ 141; ca 9.1; gfr 44; total bili 2.2 protein 6.0 albumin 2.7 ammonia 26  05-03-23; wbc 8.6; hgb 7.2; hct 24.2; mcv 77.3 plt 181; glucose 139; bun 45; creat 1.25; k+ 3.7; na++ 143; ca 8.9; gfr 48; total bili 1.7 protein 5.8 albumin 2.7  05-11-23: wbc 4.9; hgb 8.1; hct 24.8; mcv 73.4 plt 233; glucose 194; bun 40; creat 1.11; k+ 2.6; na++ 133; ca 8.3 gfr 55 ammonia 68; uric acid 13.5   TODAY   05-12-23: k+ 3.4   Review of Systems  Constitutional:  Negative for malaise/fatigue.  Respiratory:  Negative for cough and shortness of breath.   Cardiovascular:  Negative for chest pain, palpitations  and leg swelling.  Gastrointestinal:  Negative for abdominal pain, constipation  and heartburn.  Musculoskeletal:  Negative for back pain, joint pain and myalgias.  Skin: Negative.   Neurological:  Negative for dizziness.  Psychiatric/Behavioral:  The patient is not nervous/anxious.     Physical Exam Constitutional:      General: She is not in acute distress.    Appearance: She is well-developed. She is not diaphoretic.  Neck:     Thyroid: No thyromegaly.  Cardiovascular:     Rate and Rhythm: Normal rate and regular rhythm.     Pulses: Normal pulses.     Heart sounds: Murmur heard.  Pulmonary:     Effort: Pulmonary effort is normal. No respiratory distress.     Breath sounds: Normal breath sounds.  Abdominal:     General: Bowel sounds are normal. There is no distension.     Palpations: Abdomen is soft.     Tenderness: There is no abdominal tenderness.  Musculoskeletal:        General: Normal range of motion.     Cervical back: Neck supple.     Right lower leg: No edema.     Left lower leg: No edema.  Lymphadenopathy:     Cervical: No cervical adenopathy.  Skin:    General: Skin is warm and dry.  Neurological:     Mental Status: She is alert. Mental status is at baseline.     Comments: Does have some confusion Slow verbal response 05-05-23: BCAT 37/50   Psychiatric:        Mood and Affect: Mood normal.       ASSESSMENT/ PLAN:  TODAY  Hypokalemia: k+ 3.4 will give 40 meq twice daily today then 40 meq daily and will repeat k+    Synthia Innocent NP [facility]  call [phone]

## 2023-05-13 ENCOUNTER — Other Ambulatory Visit (HOSPITAL_COMMUNITY)
Admission: RE | Admit: 2023-05-13 | Discharge: 2023-05-13 | Disposition: A | Discharge status: Home / Self Care | Payer: Medicare Other | Source: Skilled Nursing Facility | Attending: Adult Health | Admitting: Adult Health

## 2023-05-13 DIAGNOSIS — E876 Hypokalemia: Secondary | ICD-10-CM | POA: Insufficient documentation

## 2023-05-13 LAB — POTASSIUM: Potassium: 3.7 mmol/L (ref 3.5–5.1)

## 2023-05-18 ENCOUNTER — Other Ambulatory Visit (HOSPITAL_COMMUNITY)
Admission: RE | Admit: 2023-05-18 | Discharge: 2023-05-18 | Disposition: A | Discharge status: Home / Self Care | Payer: Medicare Other | Source: Skilled Nursing Facility | Attending: Adult Health | Admitting: Adult Health

## 2023-05-18 ENCOUNTER — Inpatient Hospital Stay: Payer: Medicare Other

## 2023-05-18 DIAGNOSIS — E538 Deficiency of other specified B group vitamins: Secondary | ICD-10-CM | POA: Diagnosis not present

## 2023-05-18 DIAGNOSIS — K7682 Hepatic encephalopathy: Secondary | ICD-10-CM | POA: Insufficient documentation

## 2023-05-18 DIAGNOSIS — D631 Anemia in chronic kidney disease: Secondary | ICD-10-CM | POA: Diagnosis not present

## 2023-05-18 DIAGNOSIS — E559 Vitamin D deficiency, unspecified: Secondary | ICD-10-CM | POA: Diagnosis not present

## 2023-05-18 DIAGNOSIS — N1832 Chronic kidney disease, stage 3b: Secondary | ICD-10-CM

## 2023-05-18 DIAGNOSIS — D61818 Other pancytopenia: Secondary | ICD-10-CM

## 2023-05-18 DIAGNOSIS — D561 Beta thalassemia: Secondary | ICD-10-CM | POA: Diagnosis not present

## 2023-05-18 DIAGNOSIS — K7460 Unspecified cirrhosis of liver: Secondary | ICD-10-CM

## 2023-05-18 DIAGNOSIS — D696 Thrombocytopenia, unspecified: Secondary | ICD-10-CM

## 2023-05-18 DIAGNOSIS — D500 Iron deficiency anemia secondary to blood loss (chronic): Secondary | ICD-10-CM

## 2023-05-18 DIAGNOSIS — N184 Chronic kidney disease, stage 4 (severe): Secondary | ICD-10-CM | POA: Diagnosis not present

## 2023-05-18 LAB — COMPREHENSIVE METABOLIC PANEL
ALT: 42 U/L (ref 0–44)
AST: 61 U/L — ABNORMAL HIGH (ref 15–41)
Albumin: 2.9 g/dL — ABNORMAL LOW (ref 3.5–5.0)
Alkaline Phosphatase: 112 U/L (ref 38–126)
Anion gap: 10 (ref 5–15)
BUN: 27 mg/dL — ABNORMAL HIGH (ref 8–23)
CO2: 26 mmol/L (ref 22–32)
Calcium: 9 mg/dL (ref 8.9–10.3)
Chloride: 97 mmol/L — ABNORMAL LOW (ref 98–111)
Creatinine, Ser: 1.26 mg/dL — ABNORMAL HIGH (ref 0.44–1.00)
GFR, Estimated: 47 mL/min — ABNORMAL LOW (ref >60)
Glucose, Bld: 344 mg/dL — ABNORMAL HIGH (ref 70–99)
Potassium: 3.7 mmol/L (ref 3.5–5.1)
Sodium: 133 mmol/L — ABNORMAL LOW (ref 135–145)
Total Bilirubin: 1.9 mg/dL — ABNORMAL HIGH (ref 0.3–1.2)
Total Protein: 6.2 g/dL — ABNORMAL LOW (ref 6.5–8.1)

## 2023-05-18 LAB — CBC WITH DIFFERENTIAL/PLATELET
Abs Immature Granulocytes: 0 10*3/uL (ref 0.00–0.07)
Basophils Absolute: 0 10*3/uL (ref 0.0–0.1)
Basophils Relative: 1 %
Eosinophils Absolute: 0.3 10*3/uL (ref 0.0–0.5)
Eosinophils Relative: 7 %
HCT: 26.3 % — ABNORMAL LOW (ref 36.0–46.0)
Hemoglobin: 8.3 g/dL — ABNORMAL LOW (ref 12.0–15.0)
Immature Granulocytes: 0 %
Lymphocytes Relative: 28 %
Lymphs Abs: 1.1 10*3/uL (ref 0.7–4.0)
MCH: 23.5 pg — ABNORMAL LOW (ref 26.0–34.0)
MCHC: 31.6 g/dL (ref 30.0–36.0)
MCV: 74.5 fL — ABNORMAL LOW (ref 80.0–100.0)
Monocytes Absolute: 0.4 10*3/uL (ref 0.1–1.0)
Monocytes Relative: 10 %
Neutro Abs: 2.2 10*3/uL (ref 1.7–7.7)
Neutrophils Relative %: 54 %
Platelets: 179 10*3/uL (ref 150–400)
RBC: 3.53 MIL/uL — ABNORMAL LOW (ref 3.87–5.11)
RDW: 23.2 % — ABNORMAL HIGH (ref 11.5–15.5)
WBC: 4 10*3/uL (ref 4.0–10.5)
nRBC: 0 % (ref 0.0–0.2)

## 2023-05-18 LAB — VITAMIN D 25 HYDROXY (VIT D DEFICIENCY, FRACTURES): Vit D, 25-Hydroxy: 57.96 ng/mL (ref 30–100)

## 2023-05-18 LAB — SAMPLE TO BLOOD BANK

## 2023-05-18 LAB — IRON AND TIBC
Iron: 138 ug/dL (ref 28–170)
Saturation Ratios: 91 % — ABNORMAL HIGH (ref 10.4–31.8)
TIBC: 151 ug/dL — ABNORMAL LOW (ref 250–450)
UIBC: 13 ug/dL

## 2023-05-18 LAB — VITAMIN B12: Vitamin B-12: 1396 pg/mL — ABNORMAL HIGH (ref 180–914)

## 2023-05-18 LAB — URIC ACID: Uric Acid, Serum: 10.4 mg/dL — ABNORMAL HIGH (ref 2.5–7.1)

## 2023-05-18 LAB — FERRITIN: Ferritin: >7500 ng/mL — ABNORMAL HIGH (ref 11–307)

## 2023-05-18 MED ORDER — HEPARIN SOD (PORK) LOCK FLUSH 100 UNIT/ML IV SOLN
500.0000 [IU] | Freq: Once | INTRAVENOUS | Status: AC
  Administered 2023-05-18: 500 [IU] via INTRAVENOUS

## 2023-05-18 MED ORDER — SODIUM CHLORIDE 0.9% FLUSH
10.0000 mL | Freq: Once | INTRAVENOUS | Status: AC
  Administered 2023-05-18: 10 mL via INTRAVENOUS

## 2023-05-18 NOTE — Progress Notes (Signed)
Patients port flushed without difficulty.  Good blood return noted with no bruising or swelling noted at site.  Band aid applied.  VSS with discharge and left in satisfactory condition with no s/s of distress noted.   

## 2023-05-21 LAB — METHYLMALONIC ACID, SERUM: Methylmalonic Acid, Quantitative: 484 nmol/L — ABNORMAL HIGH (ref 0–378)

## 2023-05-22 ENCOUNTER — Non-Acute Institutional Stay (SKILLED_NURSING_FACILITY): Payer: Medicare Other | Admitting: Adult Health

## 2023-05-22 ENCOUNTER — Other Ambulatory Visit: Payer: Self-pay | Admitting: Adult Health

## 2023-05-22 ENCOUNTER — Encounter: Payer: Self-pay | Admitting: Adult Health

## 2023-05-22 DIAGNOSIS — K7581 Nonalcoholic steatohepatitis (NASH): Secondary | ICD-10-CM

## 2023-05-22 DIAGNOSIS — K7460 Unspecified cirrhosis of liver: Secondary | ICD-10-CM | POA: Diagnosis not present

## 2023-05-22 DIAGNOSIS — K7682 Hepatic encephalopathy: Secondary | ICD-10-CM

## 2023-05-22 DIAGNOSIS — I700 Atherosclerosis of aorta: Secondary | ICD-10-CM | POA: Diagnosis not present

## 2023-05-22 MED ORDER — COLCHICINE 0.6 MG PO TABS: 0.6000 mg | ORAL_TABLET | Freq: Two times a day (BID) | ORAL | 0 refills | Status: DC

## 2023-05-22 MED ORDER — SERTRALINE HCL 50 MG PO TABS: 50.0000 mg | ORAL_TABLET | Freq: Every day | ORAL | 0 refills | Status: AC

## 2023-05-22 MED ORDER — TORSEMIDE 20 MG PO TABS: 40.0000 mg | ORAL_TABLET | Freq: Every day | ORAL | 0 refills | Status: DC

## 2023-05-22 MED ORDER — XIFAXAN 550 MG PO TABS: 550.0000 mg | ORAL_TABLET | Freq: Two times a day (BID) | ORAL | 0 refills | Status: AC

## 2023-05-22 MED ORDER — VITAMIN D (ERGOCALCIFEROL) 1.25 MG (50000 UNIT) PO CAPS: 50000.0000 [IU] | ORAL_CAPSULE | ORAL | 0 refills | Status: DC

## 2023-05-22 MED ORDER — LACTULOSE 10 GM/15ML PO SOLN: 30.0000 g | Freq: Three times a day (TID) | ORAL | 0 refills | Status: DC

## 2023-05-22 NOTE — Progress Notes (Signed)
Location:  [facility] Room Number: 133 Place of Service:  SNF (31)   CODE STATUS: full   Allergies  Allergen Reactions   Latex Rash    Contact rash   Tape Other (See Comments)    Skin sensitivity   Gramineae Pollens Other (See Comments)    Sneezing, Running nose  Other Reaction(s): Other (see comments)    Chief Complaint  Patient presents with   Discharge Note    HPI:  She is being discharged to home with outpatient pt/ot. She does not need any dme. She will need her prescriptions written and will need to follow up with her medical provider. 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 She was admitted to this facility for short term rehab. She has participate in pt/ot. Therapy: ambulate 300 with cane moderate I; independent with upper mod I with lower body; mod I with transfers; independent with bed mobility; 4 steps mod I; brp independent; car transfers with mod I.    Past Medical History:  Diagnosis Date   Anemia in chronic kidney disease (CKD) 02/07/2022   Anxiety    Arthritis    Ascites of liver    Weekly paracentesis   Cirrhosis of liver (HCC)    CKD (chronic kidney disease) stage 4, GFR 15-29 ml/min (HCC)    GERD (gastroesophageal reflux disease)    Grade IV internal hemorrhoids    Hypertension    NASH (nonalcoholic steatohepatitis)    Neuropathy, diabetic (HCC)    Obesity (BMI 30-39.9)    OSA on CPAP    Pneumonia    Portal hypertension (HCC)    S/P TIPS (transjugular intrahepatic portosystemic shunt)    Thalassemia minor 1992    Past Surgical History:  Procedure Laterality Date   ABDOMINAL HYSTERECTOMY     BREAST BIOPSY Right 02/15/2018   [place] bx 6-6:30 ribbon shape, ONE CORE FRAGMENT WITH FIBROSIS. ONE CORE FRAGMENT WITH PORTION OF A DILATED   BREAST BIOPSY Right 02/15/2018   [place] bx 9:00 heart shape, USUAL DUCTAL HYPERPLASIA   BREAST LUMPECTOMY Right 03/09/2018   Procedure: BREAST LUMPECTOMY x 2;  Surgeon: Sung Amabile, DO;  Location: [facility] ORS;  Service: General;  Laterality: Right;   CATARACT EXTRACTION W/PHACO Left 06/11/2016   Procedure: CATARACT EXTRACTION PHACO AND INTRAOCULAR LENS PLACEMENT (IOC);  Surgeon: Sallee Lange, MD;  Location: [facility];  Service: Ophthalmology;  Laterality: Left;  Lot # [id number] H US:01:38.6 AP%:26.4 CDE:44.15   CATARACT EXTRACTION W/PHACO Right 06/15/2018   Procedure: CATARACT EXTRACTION PHACO AND INTRAOCULAR LENS PLACEMENT (IOC);  Surgeon: Galen Manila, MD;  Location: [facility];  Service: Ophthalmology;  Laterality: Right;  [place] 00:38.2 CDE 4.23 Fluid Pack Lot # [id number]   COLONOSCOPIES     COLONOSCOPY WITH PROPOFOL N/A 10/10/2020   Procedure: COLONOSCOPY WITH PROPOFOL;  Surgeon: Regis Bill, MD;  Location: [facility] END[facility]COPY;  Service: Endoscopy;  Laterality: N/A;   COLONOSCOPY WITH PROPOFOL N/A 11/20/2020   Procedure: COLONOSCOPY WITH PROPOFOL;  Surgeon: Regis Bill, MD;  Location: [facility] ENDOSCOPY;  Service: Endoscopy;  Laterality: N/A;  DM STAT CBC, BMP COVID POSITIVE 09/02/2020   COLONOSCOPY WITH PROPOFOL N/A 06/19/2022   Procedure: COLONOSCOPY WITH PROPOFOL;  Surgeon: Regis Bill, MD;  Location: [facility] ENDOSCOPY;  Service: Endoscopy;  Laterality: N/A;   CTR     ESOPHAGOGASTRODUODENOSCOPY (EGD) WITH PROPOFOL N/A 10/10/2020   Procedure: ESOPHAGOGASTRODUODENOSCOPY (EGD) WITH PROPOFOL;  Surgeon: Regis Bill, MD;  Location: [facility] ENDOSCOPY;  Service: Endoscopy;  Laterality: N/A;  COVID POSITIVE 10/08/2020   FLEXIBLE SIGMOIDOSCOPY N/A 02/06/2022   Procedure: FLEXIBLE SIGMOIDOSCOPY;  Surgeon: Regis Bill, MD;  Location: [facility] ENDOSCOPY;  Service: Endoscopy;  Laterality: N/A;  Patient requests anesthesia   FLEXIBLE SIGMOIDOSCOPY N/A 12/19/2022   Procedure: FLEXIBLE SIGMOIDOSCOPY;  Surgeon: Dolores Frame, MD;  Location: [facility];  Service: Gastroenterology;  Laterality: N/A;   IR ANGIOGRAM SELECTIVE EACH ADDITIONAL VESSEL   02/07/2022   IR ANGIOGRAM SELECTIVE EACH ADDITIONAL VESSEL  02/07/2022   IR ANGIOGRAM SELECTIVE EACH ADDITIONAL VESSEL  09/05/2022   IR ANGIOGRAM VISCERAL SELECTIVE  02/07/2022   IR ANGIOGRAM VISCERAL SELECTIVE  09/05/2022   IR CV LINE INJECTION  02/24/2023   IR EMBO ART  VEN HEMORR LYMPH EXTRAV  INC GUIDE ROADMAPPING  09/05/2022   IR EMBO ART  VEN HEMORR LYMPH EXTRAV  INC GUIDE ROADMAPPING  11/05/2022   IR EMBO ARTERIAL NOT HEMORR HEMANG INC GUIDE ROADMAPPING  02/07/2022   IR EMBO TUMOR ORGAN ISCHEMIA INFARCT INC GUIDE ROADMAPPING  01/06/2023   IR IMAGING GUIDED PORT INSERTION  06/13/2022   IR IMAGING GUIDED PORT INSERTION  11/25/2022   IR PARACENTESIS  12/17/2021   IR PARACENTESIS  03/25/2022   IR PARACENTESIS  11/25/2022   IR PARACENTESIS  01/06/2023   IR PARACENTESIS  01/19/2023   IR RADIOLOGIST EVAL & MGMT  03/18/2022   IR RADIOLOGIST EVAL & MGMT  05/07/2022   IR RADIOLOGIST EVAL & MGMT  05/16/2022   IR RADIOLOGIST EVAL & MGMT  10/13/2022   IR RADIOLOGIST EVAL & MGMT  12/12/2022   IR RADIOLOGIST EVAL & MGMT  03/30/2023   IR US GUIDE VASC ACCESS RIGHT  02/07/2022   IR US GUIDE VASC ACCESS RIGHT  09/05/2022   JOINT REPLACEMENT     KNEE SURGERY Right 09/24/2017   plates and pins   PORT-A-CATH REMOVAL N/A 09/23/2022   Procedure: MINOR REMOVAL PORT-A-CATH;  Surgeon: Franky Macho, MD;  Location: [facility];  Service: General;  Laterality: N/A;   TEE WITHOUT CARDIOVERSION N/A 09/23/2022   Procedure: TRANSESOPHAGEAL ECHOCARDIOGRAM (TEE);  Surgeon: Antoine Poche, MD;  Location: [facility];  Service: Endoscopy;  Laterality: N/A;   TOTAL SHOULDER ARTHROPLASTY Right 09/27/2015    Social History   Socioeconomic History   Marital status: Married    Spouse name: Not on file   Number of children: 0   Years of education: Not on file   Highest education level: Not on file  Occupational History   Occupation: EMPLOYED  Tobacco Use   Smoking status: Former    Types: Cigarettes   Smokeless tobacco: Never   Tobacco  comments:    Smoked "some" in college  Vaping Use   Vaping status: Never Used  Substance and Sexual Activity   Alcohol use: No    Comment: minimal alcohol intake in college   Drug use: Never   Sexual activity: Not Currently  Other Topics Concern   Not on file  Social History Narrative   Not on file   Social Determinants of Health   Financial Resource Strain: Low Risk  (04/04/2022)   Overall Financial Resource Strain (CARDIA)    Difficulty of Paying Living Expenses: Not hard at all  Food Insecurity: Patient Declined (03/25/2023)   Hunger Vital Sign    Worried About Running Out of Food in the Last Year: Patient declined    Ran Out of Food in the Last Year: Patient declined  Transportation Needs: Patient Declined (03/25/2023)   PRAPARE -  Administrator, Civil Service (Medical): Patient declined    Lack of Transportation (Non-Medical): Patient declined  Physical Activity: Inactive (04/04/2022)   Exercise Vital Sign    Days of Exercise per Week: 0 days    Minutes of Exercise per Session: 0 min  Stress: No Stress Concern Present (04/04/2022)   [employer] of Occupational Health - Occupational Stress Questionnaire    Feeling of Stress : Only a little  Social Connections: Moderately Isolated (04/04/2022)   Social Connection and Isolation Panel [NHANES]    Frequency of Communication with Friends and Family: More than three times a week    Frequency of Social Gatherings with Friends and Family: Three times a week    Attends Religious Services: Never    Active Member of Clubs or Organizations: No    Attends Banker Meetings: Never    Marital Status: Married  Catering manager Violence: Patient Declined (03/25/2023)   Humiliation, Afraid, Rape, and Kick questionnaire    Fear of Current or Ex-Partner: Patient declined    Emotionally Abused: Patient declined    Physically Abused: Patient declined    Sexually Abused: Patient declined   Family History  Problem  Relation Age of Onset   Breast cancer Mother 28   Lymphoma Mother    Diabetes Father    Kidney cancer Father    Heart disease Father    Diabetes Sister    Breast cancer Sister 62      VITAL SIGNS BP 98/64   Pulse 76   Temp 97.8 F (36.6 C)   Resp 18   Ht 5\' 10"  (1.778 m)   Wt 205 lb 6.4 oz (93.2 kg)   SpO2 94%   BMI 29.47 kg/m   Outpatient Encounter Medications as of 05/22/2023  Medication Sig   acetaminophen (TYLENOL) 500 MG tablet Take 1 tablet (500 mg total) by mouth 2 (two) times daily as needed for mild pain.   colchicine 0.6 MG tablet Take 0.6 mg by mouth 2 (two) times daily.   cyanocobalamin (VITAMIN B12) 500 MCG tablet Take 1,000 mcg by mouth daily.   lactulose (CHRONULAC) 10 GM/15ML solution Take 30 g by mouth 3 (three) times daily.   Multiple Vitamins-Minerals (MULTIVITAMIN WITH MINERALS) tablet Take 1 tablet by mouth daily.   sertraline (ZOLOFT) 50 MG tablet Take 1 tablet (50 mg total) by mouth daily.   torsemide (DEMADEX) 20 MG tablet Take 40 mg by mouth daily.   Vitamin D, Ergocalciferol, (DRISDOL) 1.25 MG (50000 UNIT) CAPS capsule Take 1 capsule by mouth once a week   XIFAXAN 550 MG TABS tablet Take 550 mg by mouth 2 (two) times daily.   No facility-administered encounter medications on file as of 05/22/2023.     SIGNIFICANT DIAGNOSTIC EXAMS  LABS REVIEWED:  04-29-23: wbc 5.5; hgb 8.1; hct 25.4; mcv 74.5 plt 146; glucose 198; bun 50; creat 1.62; k+ 3.9; na++ 134; ca 8.7; gfr 35; total bili 2.8 protein 6.1 albumin 2.9; ammonia 68  05-02-23: wbc 6.3; hgb 7.5; hct 24.1; mcv 75.8 plt 156; glucose 144; bun 47; creat 1.35; k+ 3.6; na++ 141; ca 9.1; gfr 44; total bili 2.2 protein 6.0 albumin 2.7 ammonia 26  05-03-23; wbc 8.6; hgb 7.2; hct 24.2; mcv 77.3 plt 181; glucose 139; bun 45; creat 1.25; k+ 3.7; na++ 143; ca 8.9; gfr 48; total bili 1.7 protein 5.8 albumin 2.7  05-11-23: wbc 4.9; hgb 8.1; hct 24.8; mcv 73.4 plt 233; glucose 194; bun 40; creat 1.11;  k+ 2.6; na++  133; ca 8.3 gfr 55 ammonia 68; uric acid 13.5  05-12-23: k+ 3.4   NO NEW LABS.   Review of Systems  Constitutional:  Negative for malaise/fatigue.  Respiratory:  Negative for cough and shortness of breath.   Cardiovascular:  Negative for chest pain, palpitations and leg swelling.  Gastrointestinal:  Negative for abdominal pain, constipation and heartburn.  Musculoskeletal:  Negative for back pain, joint pain and myalgias.  Skin: Negative.   Neurological:  Negative for dizziness.  Psychiatric/Behavioral:  The patient is not nervous/anxious.    Physical Exam Constitutional:      General: She is not in acute distress.    Appearance: She is well-developed. She is not diaphoretic.  Neck:     Thyroid: No thyromegaly.  Cardiovascular:     Rate and Rhythm: Normal rate and regular rhythm.     Pulses: Normal pulses.     Heart sounds: Murmur heard.  Pulmonary:     Effort: Pulmonary effort is normal. No respiratory distress.     Breath sounds: Normal breath sounds.  Abdominal:     General: Bowel sounds are normal. There is no distension.     Palpations: Abdomen is soft.     Tenderness: There is no abdominal tenderness.  Musculoskeletal:        General: Normal range of motion.     Cervical back: Neck supple.     Right lower leg: No edema.     Left lower leg: No edema.  Lymphadenopathy:     Cervical: No cervical adenopathy.  Skin:    General: Skin is warm and dry.  Neurological:     Mental Status: She is alert. Mental status is at baseline.     Comments: Does have some confusion Slow verbal response 05-05-23: BCAT 37/50    Psychiatric:        Mood and Affect: Mood normal.      ASSESSMENT/ PLAN:   Patient is being discharged with the following home health services:  pt/ot for outpatient treatment   Patient is being discharged with the following durable medical equipment:  none needed   Patient has been advised to f/u with their PCP in 1-2 weeks to for a transitions of care  visit.  Social services at their facility was responsible for arranging this appointment.  Pt was provided with adequate prescriptions of noncontrolled medications to reach the scheduled appointment .  For controlled substances, a limited supply was provided as appropriate for the individual patient.  If the pt normally receives these medications from a pain clinic or has a contract with another physician, these medications should be received from that clinic or physician only).     A 30 day supply of her prescription medications have been sent to [facility] pharmacy on [address].   Time with patient: 40 minutes: outpatient therapy; medications; dme.   Synthia Innocent NP [facility]   call [phone]

## 2023-05-25 ENCOUNTER — Inpatient Hospital Stay: Payer: Medicare Other

## 2023-05-25 ENCOUNTER — Encounter: Payer: Self-pay | Admitting: Hematology

## 2023-05-25 ENCOUNTER — Other Ambulatory Visit (HOSPITAL_COMMUNITY)
Admission: RE | Admit: 2023-05-25 | Discharge: 2023-05-25 | Disposition: A | Discharge status: Home / Self Care | Payer: Medicare Other | Source: Skilled Nursing Facility | Attending: Adult Health | Admitting: Adult Health

## 2023-05-25 ENCOUNTER — Inpatient Hospital Stay: Payer: Medicare Other | Attending: Hematology | Admitting: Hematology

## 2023-05-25 VITALS — BP 113/63 | HR 79 | Temp 98.5°F | Resp 20 | Ht 68.5 in | Wt 202.8 lb

## 2023-05-25 DIAGNOSIS — Z87891 Personal history of nicotine dependence: Secondary | ICD-10-CM | POA: Diagnosis not present

## 2023-05-25 DIAGNOSIS — Z95828 Presence of other vascular implants and grafts: Secondary | ICD-10-CM

## 2023-05-25 DIAGNOSIS — K7581 Nonalcoholic steatohepatitis (NASH): Secondary | ICD-10-CM | POA: Insufficient documentation

## 2023-05-25 DIAGNOSIS — K7460 Unspecified cirrhosis of liver: Secondary | ICD-10-CM | POA: Diagnosis not present

## 2023-05-25 DIAGNOSIS — D561 Beta thalassemia: Secondary | ICD-10-CM

## 2023-05-25 DIAGNOSIS — K7682 Hepatic encephalopathy: Secondary | ICD-10-CM | POA: Insufficient documentation

## 2023-05-25 DIAGNOSIS — D61818 Other pancytopenia: Secondary | ICD-10-CM

## 2023-05-25 DIAGNOSIS — Z8542 Personal history of malignant neoplasm of other parts of uterus: Secondary | ICD-10-CM | POA: Diagnosis not present

## 2023-05-25 DIAGNOSIS — E538 Deficiency of other specified B group vitamins: Secondary | ICD-10-CM

## 2023-05-25 LAB — CBC
HCT: 26.9 % — ABNORMAL LOW (ref 36.0–46.0)
Hemoglobin: 8.2 g/dL — ABNORMAL LOW (ref 12.0–15.0)
MCH: 22.5 pg — ABNORMAL LOW (ref 26.0–34.0)
MCHC: 30.5 g/dL (ref 30.0–36.0)
MCV: 73.9 fL — ABNORMAL LOW (ref 80.0–100.0)
Platelets: 130 10*3/uL — ABNORMAL LOW (ref 150–400)
RBC: 3.64 MIL/uL — ABNORMAL LOW (ref 3.87–5.11)
RDW: 22.1 % — ABNORMAL HIGH (ref 11.5–15.5)
WBC: 4.5 10*3/uL (ref 4.0–10.5)
nRBC: 0 % (ref 0.0–0.2)

## 2023-05-25 LAB — AMMONIA: Ammonia: 68 umol/L — ABNORMAL HIGH (ref 9–35)

## 2023-05-25 LAB — SAMPLE TO BLOOD BANK

## 2023-05-25 MED ORDER — LUSPATERCEPT-AAMT 75 MG ~~LOC~~ SOLR
100.0000 mg | Freq: Once | SUBCUTANEOUS | Status: AC
  Administered 2023-05-25: 100 mg via SUBCUTANEOUS
  Filled 2023-05-25: qty 1.5

## 2023-05-25 MED ORDER — SODIUM CHLORIDE 0.9% FLUSH
10.0000 mL | INTRAVENOUS | Status: DC | PRN
  Administered 2023-05-25: 10 mL via INTRAVENOUS

## 2023-05-25 MED ORDER — HEPARIN SOD (PORK) LOCK FLUSH 100 UNIT/ML IV SOLN
500.0000 [IU] | Freq: Once | INTRAVENOUS | Status: AC
  Administered 2023-05-25: 500 [IU] via INTRAVENOUS

## 2023-05-25 MED ORDER — CYANOCOBALAMIN 1000 MCG/ML IJ SOLN
1000.0000 ug | Freq: Once | INTRAMUSCULAR | Status: AC
  Administered 2023-05-25: 1000 ug via INTRAMUSCULAR
  Filled 2023-05-25: qty 1

## 2023-05-25 MED ORDER — FOLIC ACID 1 MG PO TABS: 1.0000 mg | ORAL_TABLET | Freq: Every day | ORAL | 6 refills | Status: DC

## 2023-05-25 NOTE — Progress Notes (Signed)
Patient tolerated Reblozyl and Vitamin B12 injections with no complaints voiced.  Site clean and dry with no bruising or swelling noted.  No complaints of pain.  Discharged with husband with vital signs stable and no signs or symptoms of distress noted.

## 2023-05-25 NOTE — Progress Notes (Signed)
HGB 8.2 today. No blood products needed.

## 2023-05-25 NOTE — Patient Instructions (Addendum)
[facility]  Discharge Instructions  You were seen and examined today by Dr. Ellin Saba.  Dr. Ellin Saba discussed your most recent lab work which revealed that everything looks stable.  Dr. Ellin Saba sent in folic acid for you to start taking. He discussed you starting luspatercept to help with your anemia. Dr. Ellin Saba is going to give you a Vitamin B-12 injection.  Follow-up as scheduled.    Thank you for choosing [facility] to provide your oncology and hematology care.   To afford each patient quality time with our provider, please arrive at least 15 minutes before your scheduled appointment time. You may need to reschedule your appointment if you arrive late (10 or more minutes). Arriving late affects you and other patients whose appointments are after yours.  Also, if you miss three or more appointments without notifying the office, you may be dismissed from the clinic at the provider's discretion.    Again, thank you for choosing [facility].  Our hope is that these requests will decrease the amount of time that you wait before being seen by our physicians.   If you have a lab appointment with the Cancer Center - please note that after April 8th, all labs will be drawn in the cancer center.  You do not have to check in or register with the main entrance as you have in the past but will complete your check-in at the cancer center.            _____________________________________________________________  Should you have questions after your visit to [facility], please contact our office at [phone] and follow the prompts.  Our office hours are 8:00 a.m. to 4:30 p.m. Monday - Thursday and 8:00 a.m. to 2:30 p.m. Friday.  Please note that voicemails left after 4:00 p.m. may not be returned until the following business day.  We are closed weekends and all major holidays.  You do have access to a nurse  24-7, just call the main number to the clinic [phone] and do not press any options, hold on the line and a nurse will answer the phone.    For prescription refill requests, have your pharmacy contact our office and allow 72 hours.    Masks are no longer required in the cancer centers. If you would like for your care team to wear a mask while they are taking care of you, please let them know. You may have one support person who is at least 65 years old accompany you for your appointments.

## 2023-05-25 NOTE — Progress Notes (Signed)
[facility] [address], [place] [address]    Clinic Day:  05/25/2023  Referring physician: Lauro Regulus, MD  Patient Care Team: Lauro Regulus, MD as PCP - General (Internal Medicine)   ASSESSMENT & PLAN:   Assessment:  1.  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  Her father, sister and brother all have thalassemia.  2.  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  Decompensated liver cirrhosis with ascites, hepatic and cephalopathy. - Status post TIPS 5/23, revision 7/23. - Follows up with [facility] liver transplant program.  3.  History of uterine cancer: - Diagnosed in 2010, s/p total hysterectomy and bilateral salpingo-oophorectomy.  She did not require any chemotherapy or radiation.  Treated in [place], [place].   Plan:  1.  Beta thalassemia intermedia: - She had multiple hospitalizations, most recent from 04/29/2023 through 05/04/2023 with altered mental status.  She was DC'd from rehab today. - Due to her transfusion dependency, I have recommended treatment with Luspatercept. - Will start Luspatercept at 1 mg/kg every 3 weeks.  We discussed side effects in detail. - We will continue for 3-4 doses and if there is no response, will consider discontinuing it. - Recommend starting folic acid 1 mg tablet daily. - Will start Luspatercept today.  RTC 6 weeks for follow-up.  Will continue to monitor CBC weekly.  2.  Vitamin B12 deficiency: - She is taking vitamin B12 1 mg daily.  B12 level is normal.  However methylmalonic acid is elevated.  Will give her a B12 injection today.  3.  Transfusion iron overload: - Latest ferritin was more than 7500.  Saturation was 91.  Prior to that ferritin was 900 on 02/16/2023.  Part of it is likely reactive as ferritin is an acute phase reactant. - We have discussed iron chelation agents.  Because of her significant liver disease and  underlying renal disease, we have decided to wait and watch at this time.  I will repeat ferritin and iron panel at next visit.   Orders Placed This Encounter  Procedures   CBC with Differential    Standing Status:   Future    Standing Expiration Date:   06/14/2024   CBC with Differential    Standing Status:   Future    Standing Expiration Date:   07/05/2024   CBC with Differential    Standing Status:   Future    Standing Expiration Date:   07/26/2024   CBC with Differential    Standing Status:   Future    Standing Expiration Date:   08/16/2024   CBC with Differential    Standing Status:   Future    Standing Expiration Date:   09/06/2024        Doreatha Massed, MD   10/7/20245:02 PM  CHIEF COMPLAINT:   Diagnosis: Beta thalassemia intermedia   Cancer Staging  No matching staging information was found for the patient.    Prior Therapy: Transfusions, Retacrit  Current Therapy: Luspatercept   HISTORY OF PRESENT ILLNESS:   Oncology History   No history exists.     INTERVAL HISTORY:   Rachel Huber is a 67 y.o. female presenting to clinic today for follow up of beta thalassemia intermedia.  Recent hospitalization from 04/29/2023 through 05/04/2023 with altered mental status.  She was discharged from rehab facility today.  She is accompanied by her husband today.  She reports chronic pain in the lower back  rated as 6 out of 10.  Today, she states that she is doing well overall. Her appetite level is at 50%. Her energy level is at 65%.  PAST MEDICAL HISTORY:   Past Medical History: Past Medical History:  Diagnosis Date   Anemia in chronic kidney disease (CKD) 02/07/2022   Anxiety    Arthritis    Ascites of liver    Weekly paracentesis   Cirrhosis of liver (HCC)    CKD (chronic kidney disease) stage 4, GFR 15-29 ml/min (HCC)    GERD (gastroesophageal reflux disease)    Grade IV internal hemorrhoids    Hypertension    NASH (nonalcoholic steatohepatitis)     Neuropathy, diabetic (HCC)    Obesity (BMI 30-39.9)    OSA on CPAP    Pneumonia    Portal hypertension (HCC)    S/P TIPS (transjugular intrahepatic portosystemic shunt)    Thalassemia minor 1992    Surgical History: Past Surgical History:  Procedure Laterality Date   ABDOMINAL HYSTERECTOMY     BREAST BIOPSY Right 02/15/2018   [place] bx 6-6:30 ribbon shape, ONE CORE FRAGMENT WITH FIBROSIS. ONE CORE FRAGMENT WITH PORTION OF A DILATED   BREAST BIOPSY Right 02/15/2018   [place] bx 9:00 heart shape, USUAL DUCTAL HYPERPLASIA   BREAST LUMPECTOMY Right 03/09/2018   Procedure: BREAST LUMPECTOMY x 2;  Surgeon: Sung Amabile, DO;  Location: [facility];  Service: General;  Laterality: Right;   CATARACT EXTRACTION W/PHACO Left 06/11/2016   Procedure: CATARACT EXTRACTION PHACO AND INTRAOCULAR LENS PLACEMENT (IOC);  Surgeon: Sallee Lange, MD;  Location: [facility];  Service: Ophthalmology;  Laterality: Left;  Lot # [id number] H US:01:38.6 AP%:26.4 CDE:44.15   CATARACT EXTRACTION W/PHACO Right 06/15/2018   Procedure: CATARACT EXTRACTION PHACO AND INTRAOCULAR LENS PLACEMENT (IOC);  Surgeon: Galen Manila, MD;  Location: [facility];  Service: Ophthalmology;  Laterality: Right;  [place] 00:38.2 CDE 4.23 Fluid Pack Lot # [id number]   COLONOSCOPIES     COLONOSCOPY WITH PROPOFOL N/A 10/10/2020   Procedure: COLONOSCOPY WITH PROPOFOL;  Surgeon: Regis Bill, MD;  Location: [facility];  Service: Endoscopy;  Laterality: N/A;   COLONOSCOPY WITH PROPOFOL N/A 11/20/2020   Procedure: COLONOSCOPY WITH PROPOFOL;  Surgeon: Regis Bill, MD;  Location: [facility];  Service: Endoscopy;  Laterality: N/A;  DM STAT CBC, BMP COVID POSITIVE 09/02/2020   COLONOSCOPY WITH PROPOFOL N/A 06/19/2022   Procedure: COLONOSCOPY WITH PROPOFOL;  Surgeon: Regis Bill, MD;  Location: [facility];  Service: Endoscopy;  Laterality: N/A;   CTR     ESOPHAGOGASTRODUODENOSCOPY (EGD) WITH PROPOFOL N/A 10/10/2020   Procedure:  ESOPHAGOGASTRODUODENOSCOPY (EGD) WITH PROPOFOL;  Surgeon: Regis Bill, MD;  Location: [facility] ENDOSCOPY;  Service: Endoscopy;  Laterality: N/A;  COVID POSITIVE 10/08/2020   FLEXIBLE SIGMOIDOSCOPY N/A 02/06/2022   Procedure: FLEXIBLE SIGMOIDOSCOPY;  Surgeon: Regis Bill, MD;  Location: [facility] END[facility]COPY;  Service: Endoscopy;  Laterality: N/A;  Patient requests anesthesia   FLEXIBLE SIGMOIDOSCOPY N/A 12/19/2022   Procedure: FLEXIBLE SIGMOIDOSCOPY;  Surgeon: Dolores Frame, MD;  Location: [facility];  Service: Gastroenterology;  Laterality: N/A;   IR ANGIOGRAM SELECTIVE EACH ADDITIONAL VESSEL  02/07/2022   IR ANGIOGRAM SELECTIVE EACH ADDITIONAL VESSEL  02/07/2022   IR ANGIOGRAM SELECTIVE EACH ADDITIONAL VESSEL  09/05/2022   IR ANGIOGRAM VISCERAL SELECTIVE  02/07/2022   IR ANGIOGRAM VISCERAL SELECTIVE  09/05/2022   IR CV LINE INJECTION  02/24/2023   IR EMBO ART  VEN HEMORR LYMPH EXTRAV  INC GUIDE ROADMAPPING  09/05/2022  IR EMBO ART  VEN HEMORR LYMPH EXTRAV  INC GUIDE ROADMAPPING  11/05/2022   IR EMBO ARTERIAL NOT HEMORR HEMANG INC GUIDE ROADMAPPING  02/07/2022   IR EMBO TUMOR ORGAN ISCHEMIA INFARCT INC GUIDE ROADMAPPING  01/06/2023   IR IMAGING GUIDED PORT INSERTION  06/13/2022   IR IMAGING GUIDED PORT INSERTION  11/25/2022   IR PARACENTESIS  12/17/2021   IR PARACENTESIS  03/25/2022   IR PARACENTESIS  11/25/2022   IR PARACENTESIS  01/06/2023   IR PARACENTESIS  01/19/2023   IR RADIOLOGIST EVAL & MGMT  03/18/2022   IR RADIOLOGIST EVAL & MGMT  05/07/2022   IR RADIOLOGIST EVAL & MGMT  05/16/2022   IR RADIOLOGIST EVAL & MGMT  10/13/2022   IR RADIOLOGIST EVAL & MGMT  12/12/2022   IR RADIOLOGIST EVAL & MGMT  03/30/2023   IR US GUIDE VASC ACCESS RIGHT  02/07/2022   IR US GUIDE VASC ACCESS RIGHT  09/05/2022   JOINT REPLACEMENT     KNEE SURGERY Right 09/24/2017   plates and pins   PORT-A-CATH REMOVAL N/A 09/23/2022   Procedure: MINOR REMOVAL PORT-A-CATH;  Surgeon: Franky Macho, MD;  Location: [facility];   Service: General;  Laterality: N/A;   TEE WITHOUT CARDIOVERSION N/A 09/23/2022   Procedure: TRANSESOPHAGEAL ECHOCARDIOGRAM (TEE);  Surgeon: Antoine Poche, MD;  Location: [facility];  Service: Endoscopy;  Laterality: N/A;   TOTAL SHOULDER ARTHROPLASTY Right 09/27/2015    Social History: Social History   Socioeconomic History   Marital status: Married    Spouse name: Not on file   Number of children: 0   Years of education: Not on file   Highest education level: Not on file  Occupational History   Occupation: EMPLOYED  Tobacco Use   Smoking status: Former    Types: Cigarettes   Smokeless tobacco: Never   Tobacco comments:    Smoked "some" in college  Vaping Use   Vaping status: Never Used  Substance and Sexual Activity   Alcohol use: No    Comment: minimal alcohol intake in college   Drug use: Never   Sexual activity: Not Currently  Other Topics Concern   Not on file  Social History Narrative   Not on file   Social Determinants of Health   Financial Resource Strain: Low Risk  (04/04/2022)   Overall Financial Resource Strain (CARDIA)    Difficulty of Paying Living Expenses: Not hard at all  Food Insecurity: Patient Declined (03/25/2023)   Hunger Vital Sign    Worried About Running Out of Food in the Last Year: Patient declined    Ran Out of Food in the Last Year: Patient declined  Transportation Needs: Patient Declined (03/25/2023)   PRAPARE - Administrator, Civil Service (Medical): Patient declined    Lack of Transportation (Non-Medical): Patient declined  Physical Activity: Inactive (04/04/2022)   Exercise Vital Sign    Days of Exercise per Week: 0 days    Minutes of Exercise per Session: 0 min  Stress: No Stress Concern Present (04/04/2022)   [employer] of Occupational Health - Occupational Stress Questionnaire    Feeling of Stress : Only a little  Social Connections: Moderately Isolated (04/04/2022)   Social Connection and Isolation Panel [NHANES]     Frequency of Communication with Friends and Family: More than three times a week    Frequency of Social Gatherings with Friends and Family: Three times a week    Attends Religious Services: Never    Active Member  of Clubs or Organizations: No    Attends Banker Meetings: Never    Marital Status: Married  Catering manager Violence: Patient Declined (03/25/2023)   Humiliation, Afraid, Rape, and Kick questionnaire    Fear of Current or Ex-Partner: Patient declined    Emotionally Abused: Patient declined    Physically Abused: Patient declined    Sexually Abused: Patient declined    Family History: Family History  Problem Relation Age of Onset   Breast cancer Mother 37   Lymphoma Mother    Diabetes Father    Kidney cancer Father    Heart disease Father    Diabetes Sister    Breast cancer Sister 36    Current Medications:  Current Outpatient Medications:    acetaminophen (TYLENOL) 500 MG tablet, Take 1 tablet (500 mg total) by mouth 2 (two) times daily as needed for mild pain., Disp: 30 tablet, Rfl: 0   colchicine 0.6 MG tablet, Take 1 tablet (0.6 mg total) by mouth 2 (two) times daily., Disp: 60 tablet, Rfl: 0   cyanocobalamin (VITAMIN B12) 500 MCG tablet, Take 1,000 mcg by mouth daily., Disp: , Rfl:    folic acid (FOLVITE) 1 MG tablet, Take 1 tablet (1 mg total) by mouth daily., Disp: 30 tablet, Rfl: 6   lactulose (CHRONULAC) 10 GM/15ML solution, Take 45 mLs (30 g total) by mouth 3 (three) times daily., Disp: 6000 mL, Rfl: 0   Multiple Vitamins-Minerals (MULTIVITAMIN WITH MINERALS) tablet, Take 1 tablet by mouth daily., Disp: , Rfl:    sertraline (ZOLOFT) 50 MG tablet, Take 1 tablet (50 mg total) by mouth daily., Disp: 30 tablet, Rfl: 0   torsemide (DEMADEX) 20 MG tablet, Take 2 tablets (40 mg total) by mouth daily., Disp: 30 tablet, Rfl: 0   Vitamin D, Ergocalciferol, (DRISDOL) 1.25 MG (50000 UNIT) CAPS capsule, Take 1 capsule (50,000 Units total) by mouth once a week.,  Disp: 5 capsule, Rfl: 0   XIFAXAN 550 MG TABS tablet, Take 1 tablet (550 mg total) by mouth 2 (two) times daily., Disp: 60 tablet, Rfl: 0   Allergies: Allergies  Allergen Reactions   Latex Rash    Contact rash   Tape Other (See Comments)    Skin sensitivity   Gramineae Pollens Other (See Comments)    Sneezing, Running nose  Other Reaction(s): Other (see comments)    REVIEW OF SYSTEMS:   Review of Systems  Respiratory:  Positive for cough.   Gastrointestinal:  Positive for diarrhea and vomiting.  Neurological:  Positive for headaches and numbness.  Psychiatric/Behavioral:  Positive for sleep disturbance.   All other systems reviewed and are negative.    VITALS:   There were no vitals taken for this visit.  Wt Readings from Last 3 Encounters:  05/25/23 202 lb 12.8 oz (92 kg)  05/22/23 205 lb 6.4 oz (93.2 kg)  05/12/23 206 lb 6.4 oz (93.6 kg)    There is no height or weight on file to calculate BMI.  Performance status (ECOG): 2 - Symptomatic, <50% confined to bed  PHYSICAL EXAM:   Physical Exam Vitals reviewed.  Constitutional:      Appearance: Normal appearance.  Cardiovascular:     Rate and Rhythm: Normal rate and regular rhythm.     Heart sounds: Normal heart sounds.  Pulmonary:     Effort: Pulmonary effort is normal.     Breath sounds: Normal breath sounds.  Musculoskeletal:     Right lower leg: Edema present.  Left lower leg: Edema present.  Skin:    General: Skin is warm and dry.  Neurological:     General: No focal deficit present.     Mental Status: She is alert and oriented to person, place, and time.     LABS:      Latest Ref Rng & Units 05/25/2023    1:50 PM 05/18/2023   11:48 AM 05/11/2023    6:30 AM  CBC  WBC 4.0 - 10.5 K/uL 4.5  4.0  4.9   Hemoglobin 12.0 - 15.0 g/dL 8.2  8.3  8.1   Hematocrit 36.0 - 46.0 % 26.9  26.3  24.8   Platelets 150 - 400 K/uL 130  179  233       Latest Ref Rng & Units 05/18/2023   11:48 AM 05/13/2023     7:00 AM 05/12/2023    8:00 AM  CMP  Glucose 70 - 99 mg/dL 696     BUN 8 - 23 mg/dL 27     Creatinine 2.95 - 1.00 mg/dL 2.84     Sodium 132 - 440 mmol/L 133     Potassium 3.5 - 5.1 mmol/L 3.7  3.7  3.4   Chloride 98 - 111 mmol/L 97     CO2 22 - 32 mmol/L 26     Calcium 8.9 - 10.3 mg/dL 9.0     Total Protein 6.5 - 8.1 g/dL 6.2     Total Bilirubin 0.3 - 1.2 mg/dL 1.9     Alkaline Phos 38 - 126 U/L 112     AST 15 - 41 U/L 61     ALT 0 - 44 U/L 42        No results found for: "CEA1", "CEA" / No results found for: "CEA1", "CEA" No results found for: "PSA1" No results found for: "NUU725" No results found for: "CAN125"  Lab Results  Component Value Date   TOTALPROTELP 4.5 (L) 01/06/2023   ALBUMINELP 2.5 (L) 01/06/2023   A1GS 0.2 01/06/2023   A2GS 0.3 (L) 01/06/2023   BETS 0.6 (L) 01/06/2023   GAMS 0.9 01/06/2023   MSPIKE Not Observed 01/06/2023   SPEI Comment 01/06/2023   Lab Results  Component Value Date   TIBC 151 (L) 05/18/2023   TIBC 131 (L) 02/16/2023   TIBC 164 (L) 01/07/2023   FERRITIN >7,500 (H) 05/18/2023   FERRITIN 900 (H) 02/16/2023   FERRITIN 4,491 (H) 01/07/2023   IRONPCTSAT 91 (H) 05/18/2023   IRONPCTSAT 92 (H) 02/16/2023   IRONPCTSAT 62 (H) 01/07/2023   Lab Results  Component Value Date   LDH 241 (H) 09/19/2022   LDH 247 (H) 02/09/2022     STUDIES:   [place] ASCITES (ABDOMEN LIMITED)  Result Date: 05/11/2023 CLINICAL DATA:  Liver cirrhosis secondary to NASH. EXAM: LIMITED ABDOMEN ULTRASOUND FOR ASCITES TECHNIQUE: Limited ultrasound survey for ascites was performed in all four abdominal quadrants. COMPARISON:  Abdominal ultrasound 04/07/2023. FINDINGS: Right lower quadrant and left lower quadrant imaging demonstrates minimal ascites. IMPRESSION: Minimal ascites. Electronically Signed   By: Marin Roberts M.D.   On: 05/11/2023 11:07   MR BRAIN WO CONTRAST  Result Date: 05/02/2023 CLINICAL DATA:  Altered mental status. EXAM: MRI HEAD WITHOUT CONTRAST  TECHNIQUE: Multiplanar, multiecho pulse sequences of the brain and surrounding structures were obtained without intravenous contrast. COMPARISON:  CT head without contrast 04/29/2023 and 09/21/2017 FINDINGS: Brain: A heavily calcified meningioma seen on prior head CTs in the right middle cranial fossa measures 2.2 x 1.5  cm. There is some mass effect on the lateral aspect of the right temporal tip without significant signal change. Mild atrophy and periventricular white matter changes are within normal limits for age. White matter is otherwise within normal limits. Deep brain nuclei are within normal limits. The ventricles are of normal size. No significant extraaxial fluid collection is present. The brainstem and cerebellum are within normal limits. The internal auditory canals are within normal limits. Midline structures are within normal limits. Vascular: Flow is present in the major intracranial arteries. Skull and upper cervical spine: The craniocervical junction is normal. Upper cervical spine is within normal limits. Marrow signal is unremarkable. Sinuses/Orbits: A chronic polyp or mucous retention cyst is present in the right sphenoid sinus. Mild mucosal thickening is present the anterior ethmoid air cells. Right greater than left mastoid effusions are present. No obstructing nasopharyngeal lesion is present. Additional small polyps are present in the inferior left maxillary sinus. IMPRESSION: 1. 2.2 x 1.5 cm heavily calcified meningioma in the right middle cranial fossa with some mass effect on the lateral aspect of the right temporal tip without significant signal change. 2. Otherwise normal MRI appearance of the brain for age. No acute intracranial abnormality to explain altered mental status. 3. Right greater than left mastoid effusions. No obstructing nasopharyngeal lesion is present. 4. Scattered sinus disease as described. Electronically Signed   By: Marin Roberts M.D.   On: 05/02/2023 20:01    DG Chest Portable 1 View  Result Date: 04/29/2023 CLINICAL DATA:  Cough, altered mental status EXAM: PORTABLE CHEST 1 VIEW COMPARISON:  Prior chest x-ray 01/08/2023 FINDINGS: Stable mild cardiomegaly. Right IJ approach single-lumen power injectable port catheter. Catheter tip overlies the upper atrium. Low inspiratory volumes. Increased pulmonary vascular congestion bordering on mild edema. Probable bibasilar atelectasis. Nonspecific foci of patchy airspace opacity in the bilateral upper lungs. Embolization coils noted in the left upper quadrant, likely within the splenic artery. Surgical changes of prior right reverse shoulder arthroplasty. IMPRESSION: 1. Low lung volumes with increased pulmonary vascular congestion bordering on mild edema. At least mild CHF is favored. 2. Nonspecific patchy airspace opacities in both upper lungs. Differential considerations include multifocal pneumonia versus more confluent areas of pulmonary edema. 3. Well-positioned port catheter. Electronically Signed   By: Malachy Moan M.D.   On: 04/29/2023 15:17   CT Head Wo Contrast  Result Date: 04/29/2023 CLINICAL DATA:  Altered mental status, unknown cause EXAM: CT HEAD WITHOUT CONTRAST TECHNIQUE: Contiguous axial images were obtained from the base of the skull through the vertex without intravenous contrast. RADIATION DOSE REDUCTION: This exam was performed according to the departmental dose-optimization program which includes automated exposure control, adjustment of the mA and/or kV according to patient size and/or use of iterative reconstruction technique. COMPARISON:  03/25/2023 FINDINGS: Brain: No evidence of acute infarction, hemorrhage, parenchyma mass, mass effect, or midline shift. No hydrocephalus or extra-axial fluid collection. Redemonstrated calcified meningioma along the right temporal convexity, which measures up to 2.3 x 1.3 x 1.8 cm (AP x TR x CC), with unchanged mass effect on the right temporal lobe.  Vascular: No hyperdense vessel. Skull: Negative for fracture or focal lesion. Sinuses/Orbits: Right sphenoid mucous retention cyst. Mild mucosal thickening in the ethmoid air cells. Status post bilateral lens replacements. Other: Trace fluid in the right mastoid air cells. IMPRESSION: 1. No acute intracranial process. 2. Unchanged calcified meningioma along the right temporal convexity. Electronically Signed   By: Wiliam Ke M.D.   On: 04/29/2023 12:25

## 2023-05-25 NOTE — Patient Instructions (Signed)
[facility]  Discharge Instructions: Thank you for choosing [facility] to provide your oncology and hematology care.  If you have a lab appointment with the Cancer Center - please note that after April 8th, 2024, all labs will be drawn in the cancer center.  You do not have to check in or register with the main entrance as you have in the past but will complete your check-in in the cancer center.  Wear comfortable clothing and clothing appropriate for easy access to any Portacath or PICC line.   We strive to give you quality time with your provider. You may need to reschedule your appointment if you arrive late (15 or more minutes).  Arriving late affects you and other patients whose appointments are after yours.  Also, if you miss three or more appointments without notifying the office, you may be dismissed from the clinic at the provider's discretion.      For prescription refill requests, have your pharmacy contact our office and allow 72 hours for refills to be completed.    Today you received the following:  Reblozyl and Vitamin B12.  Luspatercept Injection What is this medication? LUSPATERCEPT (lus PAT er sept) treats low levels of red blood cells (anemia) in the body in people with beta thalassemia or myelodysplastic syndromes. It works by helping the body make more red blood cells. This medicine may be used for other purposes; ask your health care provider or pharmacist if you have questions. COMMON BRAND NAME(S): REBLOZYL What should I tell my care team before I take this medication? They need to know if you have any of these conditions: Have had your spleen removed High blood pressure History of blood clots Tobacco use An unusual or allergic reaction to luspatercept, other medications, foods, dyes, or preservatives Pregnant or trying to get pregnant Breastfeeding How should I use this medication? This medication is injected under the skin. It  is given by your care team in a hospital or clinic setting. Talk to your care team about the use of the medication in children. It is not approved for use in children. Overdosage: If you think you have taken too much of this medicine contact a poison control center or emergency room at once. NOTE: This medicine is only for you. Do not share this medicine with others. What if I miss a dose? Keep appointments for follow-up doses. It is important not to miss your dose. Call your care team if you are unable to keep an appointment. What may interact with this medication? Interactions are not expected. This list may not describe all possible interactions. Give your health care provider a list of all the medicines, herbs, non-prescription drugs, or dietary supplements you use. Also tell them if you smoke, drink alcohol, or use illegal drugs. Some items may interact with your medicine. What should I watch for while using this medication? Your condition will be monitored carefully while you are receiving this medication. You may need blood work done while you are taking this medication. Talk to your care team if you may be pregnant. Serious birth defects can occur if you take this medication during pregnancy. Contraception is recommended while taking this medication. Your care team can help you find the option that works for you. Talk to your care team before breastfeeding. Changes to your treatment plan may be needed. What side effects may I notice from receiving this medication? Side effects that you should report to your care team  as soon as possible: Allergic reactions--skin rash, itching, hives, swelling of the face, lips, tongue, or throat Blood clot--pain, swelling, or warmth in the leg, shortness of breath, chest pain Increase in blood pressure Severe back pain, numbness or weakness of the hands, arms, legs, or feet, loss of coordination, loss of bowel or bladder control Side effects that usually  do not require medical attention (report these to your care team if they continue or are bothersome): Bone pain Dizziness Fatigue Headache Joint pain Muscle pain Stomach pain This list may not describe all possible side effects. Call your doctor for medical advice about side effects. You may report side effects to FDA at 1-800-FDA-1088. Where should I keep my medication? This medication is given in a hospital or clinic. It will not be stored at home. NOTE: This sheet is a summary. It may not cover all possible information. If you have questions about this medicine, talk to your doctor, pharmacist, or health care provider.  2024 Elsevier/Gold Standard (2023-01-02 00:00:00)    Vitamin B12 Injection What is this medication? Vitamin B12 (VAHY tuh min B12) prevents and treats low vitamin B12 levels in your body. It is used in people who do not get enough vitamin B12 from their diet or when their digestive tract does not absorb enough. Vitamin B12 plays an important role in maintaining the health of your nervous system and red blood cells. This medicine may be used for other purposes; ask your health care provider or pharmacist if you have questions. COMMON BRAND NAME(S): B-12 Compliance Kit, B-12 Injection Kit, Cyomin, Dodex, LA-12, Nutri-Twelve, Physicians EZ Use B-12, Primabalt, Vitamin Deficiency Injectable System - B12 What should I tell my care team before I take this medication? They need to know if you have any of these conditions: Kidney disease Leber's disease Megaloblastic anemia An unusual or allergic reaction to cyanocobalamin, cobalt, other medications, foods, dyes, or preservatives Pregnant or trying to get pregnant Breast-feeding How should I use this medication? This medication is injected into a muscle or deeply under the skin. It is usually given in a clinic or care team's office. However, your care team may teach you how to inject yourself. Follow all instructions. Talk  to your care team about the use of this medication in children. Special care may be needed. Overdosage: If you think you have taken too much of this medicine contact a poison control center or emergency room at once. NOTE: This medicine is only for you. Do not share this medicine with others. What if I miss a dose? If you are given your dose at a clinic or care team's office, call to reschedule your appointment. If you give your own injections, and you miss a dose, take it as soon as you can. If it is almost time for your next dose, take only that dose. Do not take double or extra doses. What may interact with this medication? Alcohol Colchicine This list may not describe all possible interactions. Give your health care provider a list of all the medicines, herbs, non-prescription drugs, or dietary supplements you use. Also tell them if you smoke, drink alcohol, or use illegal drugs. Some items may interact with your medicine. What should I watch for while using this medication? Visit your care team regularly. You may need blood work done while you are taking this medication. You may need to follow a special diet. Talk to your care team. Limit your alcohol intake and avoid smoking to get the best benefit. What  side effects may I notice from receiving this medication? Side effects that you should report to your care team as soon as possible: Allergic reactions--skin rash, itching, hives, swelling of the face, lips, tongue, or throat Swelling of the ankles, hands, or feet Trouble breathing Side effects that usually do not require medical attention (report to your care team if they continue or are bothersome): Diarrhea This list may not describe all possible side effects. Call your doctor for medical advice about side effects. You may report side effects to FDA at 1-800-FDA-1088. Where should I keep my medication? Keep out of the reach of children. Store at room temperature between 15 and 30  degrees C (59 and 85 degrees F). Protect from light. Throw away any unused medication after the expiration date. NOTE: This sheet is a summary. It may not cover all possible information. If you have questions about this medicine, talk to your doctor, pharmacist, or health care provider.  2024 Elsevier/Gold Standard (2021-04-16 00:00:00)     To help prevent nausea and vomiting after your treatment, we encourage you to take your nausea medication as directed.  BELOW ARE SYMPTOMS THAT SHOULD BE REPORTED IMMEDIATELY: *FEVER GREATER THAN 100.4 F (38 C) OR HIGHER *CHILLS OR SWEATING *NAUSEA AND VOMITING THAT IS NOT CONTROLLED WITH YOUR NAUSEA MEDICATION *UNUSUAL SHORTNESS OF BREATH *UNUSUAL BRUISING OR BLEEDING *URINARY PROBLEMS (pain or burning when urinating, or frequent urination) *BOWEL PROBLEMS (unusual diarrhea, constipation, pain near the anus) TENDERNESS IN MOUTH AND THROAT WITH OR WITHOUT PRESENCE OF ULCERS (sore throat, sores in mouth, or a toothache) UNUSUAL RASH, SWELLING OR PAIN  UNUSUAL VAGINAL DISCHARGE OR ITCHING   Items with * indicate a potential emergency and should be followed up as soon as possible or go to the Emergency Department if any problems should occur.  Please show the CHEMOTHERAPY ALERT CARD or IMMUNOTHERAPY ALERT CARD at check-in to the Emergency Department and triage nurse.  Should you have questions after your visit or need to cancel or reschedule your appointment, please contact [facility] [phone]  and follow the prompts.  Office hours are 8:00 a.m. to 4:30 p.m. Monday - Friday. Please note that voicemails left after 4:00 p.m. may not be returned until the following business day.  We are closed weekends and major holidays. You have access to a nurse at all times for urgent questions. Please call the main number to the clinic [phone] and follow the prompts.  For any non-urgent questions, you may also contact your provider using  MyChart. We now offer e-Visits for anyone 38 and older to request care online for non-urgent symptoms. For details visit [email or web address].   Also download the MyChart app! Go to the app store, search "MyChart", open the app, select [facility], and log in with your MyChart username and password.

## 2023-05-25 NOTE — Progress Notes (Signed)
START OFF PATHWAY REGIMEN - Other   [id number]:Luspatercept 1 mg/kg SUBQ D1 q21 Days:   A cycle is every 21 days:     Luspatercept-aamt   **Always confirm dose/schedule in your pharmacy ordering system**  Patient Characteristics: Intent of Therapy: Non-Curative / Palliative Intent, Discussed with Patient

## 2023-05-25 NOTE — Progress Notes (Signed)
Patients port flushed without difficulty.  Good blood return noted with no bruising or swelling noted at site.  Band aid applied.  VSS with discharge and left in satisfactory condition with no s/s of distress noted.   

## 2023-05-28 ENCOUNTER — Ambulatory Visit (HOSPITAL_COMMUNITY): Payer: Medicare Other

## 2023-05-29 ENCOUNTER — Other Ambulatory Visit: Payer: Self-pay

## 2023-05-29 ENCOUNTER — Ambulatory Visit (HOSPITAL_COMMUNITY): Payer: Medicare Other | Attending: Gastroenterology | Admitting: Occupational Therapy

## 2023-05-29 ENCOUNTER — Encounter (HOSPITAL_COMMUNITY): Payer: Self-pay | Admitting: Occupational Therapy

## 2023-05-29 DIAGNOSIS — R29898 Other symptoms and signs involving the musculoskeletal system: Secondary | ICD-10-CM | POA: Diagnosis present

## 2023-05-29 DIAGNOSIS — M25611 Stiffness of right shoulder, not elsewhere classified: Secondary | ICD-10-CM | POA: Insufficient documentation

## 2023-05-29 DIAGNOSIS — M25612 Stiffness of left shoulder, not elsewhere classified: Secondary | ICD-10-CM | POA: Diagnosis present

## 2023-05-29 DIAGNOSIS — R278 Other lack of coordination: Secondary | ICD-10-CM | POA: Diagnosis present

## 2023-05-29 NOTE — Therapy (Signed)
OUTPATIENT OCCUPATIONAL THERAPY ORTHO EVALUATION  Patient Name: Rachel Huber MRN: [id number] DOB:1956/02/01, 67 y.o., female Today's Date: 06/01/2023  PCP: Einar Crow, MD REFERRING PROVIDER: Synthia Innocent, NP  END OF SESSION:   05/29/23 0933  OT Visits / Re-Eval  Visit Number 1  Number of Visits 9  Date for OT Re-Evaluation 07/03/23  Authorization  Authorization Type Medicare Part A and B  OT Time Calculation  OT Start Time [phone]  OT Stop Time 0933  OT Time Calculation (min) 42 min  End of Session  Activity Tolerance Patient tolerated treatment well  Behavior During Therapy [facility] for tasks assessed/performed    Past Medical History:  Diagnosis Date   Anemia in chronic kidney disease (CKD) 02/07/2022   Anxiety    Arthritis    Ascites of liver    Weekly paracentesis   Cirrhosis of liver (HCC)    CKD (chronic kidney disease) stage 4, GFR 15-29 ml/min (HCC)    GERD (gastroesophageal reflux disease)    Grade IV internal hemorrhoids    Hypertension    NASH (nonalcoholic steatohepatitis)    Neuropathy, diabetic (HCC)    Obesity (BMI 30-39.9)    OSA on CPAP    Pneumonia    Portal hypertension (HCC)    S/P TIPS (transjugular intrahepatic portosystemic shunt)    Thalassemia minor 1992   Past Surgical History:  Procedure Laterality Date   ABDOMINAL HYSTERECTOMY     BREAST BIOPSY Right 02/15/2018   [place] bx 6-6:30 ribbon shape, ONE CORE FRAGMENT WITH FIBROSIS. ONE CORE FRAGMENT WITH PORTION OF A DILATED   BREAST BIOPSY Right 02/15/2018   [place] bx 9:00 heart shape, USUAL DUCTAL HYPERPLASIA   BREAST LUMPECTOMY Right 03/09/2018   Procedure: BREAST LUMPECTOMY x 2;  Surgeon: Sung Amabile, DO;  Location: [facility] ORS;  Service: General;  Laterality: Right;   CATARACT EXTRACTION W/PHACO Left 06/11/2016   Procedure: CATARACT EXTRACTION PHACO AND INTRAOCULAR LENS PLACEMENT (IOC);  Surgeon: Sallee Lange, MD;  Location: [facility] ORS;  Service: Ophthalmology;  Laterality: Left;  Lot #  [id number] H US:01:38.6 AP%:26.4 CDE:44.15   CATARACT EXTRACTION W/PHACO Right 06/15/2018   Procedure: CATARACT EXTRACTION PHACO AND INTRAOCULAR LENS PLACEMENT (IOC);  Surgeon: Galen Manila, MD;  Location: [facility] ORS;  Service: Ophthalmology;  Laterality: Right;  [place] 00:38.2 CDE 4.23 Fluid Pack Lot # [id number]   COLONOSCOPIES     COLONOSCOPY WITH PROPOFOL N/A 10/10/2020   Procedure: COLONOSCOPY WITH PROPOFOL;  Surgeon: Regis Bill, MD;  Location[facility] ENDOSCOPY;  Service: Endoscopy;  Laterality: N/A;   COLONOSCOPY WITH PROPOFOL N/A 11/20/2020   Procedure: COLONOSCOPY WITH PROPOFOL;  Surgeon: Regis Bill, MD;  Location: [facility] ENDOSCOPY;  Service: Endoscopy;  Laterality: N/A;  DM STAT CBC, BMP COVID POSITIVE 09/02/2020   COLONOSCOPY WITH PROPOFOL N/A 06/19/2022   Procedure: COLONOSCOPY WITH PROPOFOL;  Surgeon: Regis Bill, MD;  Location: [facility] ENDOSCOPY;  Service: Endoscopy;  Laterality: N/A;   CTR     ESOPHAGOGASTRODUODENOSCOPY (EGD) WITH PROPOFOL N/A 10/10/2020   Procedure: ESOPHAGOGASTRODUODENOSCOPY (EGD) WITH PROPOFOL;  Surgeon: Regis Bill, MD;  Location: [facility] ENDOSCOPY;  Service: Endoscopy;  Laterality: N/A;  COVID POSITIVE 10/08/2020   FLEXIBLE SIGMOIDOSCOPY N/A 02/06/2022   Procedure: FLEXIBLE SIGMOIDOSCOPY;  Surgeon: Regis Bill, MD;  Location: [facility] ENDOSCOPY;  Service: Endoscopy;  Laterality: N/A;  Patient requests anesthesia   FLEXIBLE SIGMOIDOSCOPY N/A 12/19/2022   Procedure: FLEXIBLE SIGMOIDOSCOPY;  Surgeon: Dolores Frame, MD;  Location: [facility];  Service: Gastroenterology;  Laterality: N/A;  IR ANGIOGRAM SELECTIVE EACH ADDITIONAL VESSEL  02/07/2022   IR ANGIOGRAM SELECTIVE EACH ADDITIONAL VESSEL  02/07/2022   IR ANGIOGRAM SELECTIVE EACH ADDITIONAL VESSEL  09/05/2022   IR ANGIOGRAM VISCERAL SELECTIVE  02/07/2022   IR ANGIOGRAM VISCERAL SELECTIVE  09/05/2022   IR CV LINE INJECTION  02/24/2023   IR EMBO ART  VEN HEMORR LYMPH EXTRAV  INC  GUIDE ROADMAPPING  09/05/2022   IR EMBO ART  VEN HEMORR LYMPH EXTRAV  INC GUIDE ROADMAPPING  11/05/2022   IR EMBO ARTERIAL NOT HEMORR HEMANG INC GUIDE ROADMAPPING  02/07/2022   IR EMBO TUMOR ORGAN ISCHEMIA INFARCT INC GUIDE ROADMAPPING  01/06/2023   IR IMAGING GUIDED PORT INSERTION  06/13/2022   IR IMAGING GUIDED PORT INSERTION  11/25/2022   IR PARACENTESIS  12/17/2021   IR PARACENTESIS  03/25/2022   IR PARACENTESIS  11/25/2022   IR PARACENTESIS  01/06/2023   IR PARACENTESIS  01/19/2023   IR RADIOLOGIST EVAL & MGMT  03/18/2022   IR RADIOLOGIST EVAL & MGMT  05/07/2022   IR RADIOLOGIST EVAL & MGMT  05/16/2022   IR RADIOLOGIST EVAL & MGMT  10/13/2022   IR RADIOLOGIST EVAL & MGMT  12/12/2022   IR RADIOLOGIST EVAL & MGMT  03/30/2023   IR US GUIDE VASC ACCESS RIGHT  02/07/2022   IR US GUIDE VASC ACCESS RIGHT  09/05/2022   JOINT REPLACEMENT     KNEE SURGERY Right 09/24/2017   plates and pins   PORT-A-CATH REMOVAL N/A 09/23/2022   Procedure: MINOR REMOVAL PORT-A-CATH;  Surgeon: Franky Macho, MD;  Location: [facility] ORS;  Service: General;  Laterality: N/A;   TEE WITHOUT CARDIOVERSION N/A 09/23/2022   Procedure: TRANSESOPHAGEAL ECHOCARDIOGRAM (TEE);  Surgeon: Antoine Poche, MD;  Location: [facility] ORS;  Service: Endoscopy;  Laterality: N/A;   TOTAL SHOULDER ARTHROPLASTY Right 09/27/2015   Patient Active Problem List   Diagnosis Date Noted   Beta thalassemia intermedia (HCC) 05/25/2023   Gout due to renal impairment 05/12/2023   Unspecified protein-calorie malnutrition (HCC) 05/06/2023   Hypoalbuminemia due to protein-calorie malnutrition (HCC) 05/05/2023   Vitamin D deficiency 05/05/2023   Bilateral lower extremity edema 05/05/2023   Aortic atherosclerosis (HCC) 05/05/2023   AMS (altered mental status) 04/29/2023   Hepatic encephalopathy (HCC) 03/25/2023   Acute metabolic encephalopathy 03/25/2023   Acute cystitis with hematuria 03/25/2023   Acute hepatic encephalopathy (HCC) 01/19/2023   Pancytopenia (HCC)  01/06/2023   Anemia 01/06/2023   Internal hemorrhoid 01/06/2023   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   Symptomatic anemia 12/17/2022   Hypokalemia 11/06/2022   Acute on chronic anemia 11/03/2022   Hypotension 11/03/2022   AKI (acute kidney injury) (HCC) 10/15/2022   Rectal bleeding 06/17/2022   Obesity (BMI 30-39.9) 06/17/2022   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 mellitus (HCC) 06/17/2022   Grade IV internal hemorrhoids 05/16/2022   Thrombocytopenia (HCC) 03/05/2022   Anemia in chronic kidney disease (CKD) 02/07/2022   GI bleeding 02/04/2022   Chronic kidney disease, stage 3b (HCC) 02/04/2022   Depression with anxiety 02/04/2022   Liver cirrhosis secondary to NASH (HCC)    Insomnia 01/27/2022   Iron deficiency anemia due to chronic blood loss 02/13/2021   History of uterine cancer 02/13/2021   Decompensated hepatic cirrhosis (HCC) 10/25/2020   OSA on CPAP 10/25/2020   Closed right hip fracture (HCC) 09/02/2020   Type 2 diabetes mellitus with hyperlipidemia (HCC) 09/02/2020   Thalassemia minor    HLD (hyperlipidemia)    Depression     ONSET  DATE: 04/29/23  REFERRING DIAG: Weakness following Hepatic Encephalopathy  THERAPY DIAG:  Shoulder stiffness, right  Stiffness of left shoulder, not elsewhere classified  Other symptoms and signs involving the musculoskeletal system  Rationale for Evaluation and Treatment: Rehabilitation  SUBJECTIVE:   SUBJECTIVE STATEMENT: "My whole body aches" Pt accompanied by: self  PERTINENT HISTORY: 2.5 weeks at [facility] following a week in the Hospital due to Hepatic Encephalopathy. PMH significant for diabetes mellitus hypertension [name] status post TIPS revision at [facility] weekly paracentesis recurrent grade 4 internal hemorrhoids status post embolization of superior rectal artery 11/05/2022, multifactorial anemia, OSA with CPAP, CKD stage III, thrombocytopenia/leukopenia due to splenic sequestration/cirrhosis, beta thalassemia  intermedia per hematology/oncology, hyperlipidemia, chronic orthostasis with ProAmatine.   PRECAUTIONS: Fall  WEIGHT BEARING RESTRICTIONS: No  PAIN:  Are you having pain? Yes: NPRS scale: 7/10 Pain location: shoulders and neck Pain description: aching Aggravating factors: moving Relieving factors: moving/medication  FALLS: Has patient fallen in last 6 months? Yes. Number of falls 1  LIVING ENVIRONMENT: Lives with: lives with their family Lives in: House/apartment  PLOF: Independent with basic ADLs and Independent with household mobility with device  PATIENT GOALS: To get stronger  NEXT MD VISIT: 06/05/23  OBJECTIVE:  Note: Objective measures were completed at Evaluation unless otherwise noted.  HAND DOMINANCE: Right  ADLs: Overall ADLs: Due to weakness and fatigue pt requires assist with all IADL's and compensatory strategies for BADL's and supervision from husband  UPPER EXTREMITY ROM:     Active ROM Right eval  Shoulder flexion 118  Shoulder abduction 99  Shoulder internal rotation 90  Shoulder external rotation 13  (Blank rows = not tested)   UPPER EXTREMITY MMT:     MMT Right eval Left eval  Shoulder flexion 4-/5 4+/5  Shoulder abduction 4-/5 4/5  Shoulder internal rotation 4-/5 4/5  Shoulder external rotation 3+/5 4/5  Elbow flexion 4+/5 4+/5  Elbow extension 4+/5 4+/5  (Blank rows = not tested)  HAND FUNCTION: Grip strength: Right: 42 lbs; Left: 24 lbs, Lateral pinch: Right: 9 lbs, Left: 8 lbs, and 3 point pinch: Right: 8 lbs, Left: 6 lbs  COORDINATION: 9 Hole Peg test: Right: 42.64 sec; Left: 1 min 3 sec  SENSATION: WFL  EDEMA: No swelling noted  OBSERVATIONS: Increased tremors noted in BUE   TODAY'S TREATMENT:                                                                                                                              DATE: 05/29/23: Evaluation Only    PATIENT EDUCATION: Education details: Will Start first  session Person educated: Patient Education method: Explanation Education comprehension: verbalized understanding  HOME EXERCISE PROGRAM: Will Start first session  GOALS: Goals reviewed with patient? No  SHORT TERM GOALS: Target date: 07/03/23  Pt will be provided and educated on HEP for BUE mobility and strength for ADL completion.   Goal status: INITIAL  2.  Pt  will decrease pain in BUE to 3/10 or less in order to sleep for 3+ consecutive hours without waking due to pain.   Goal status: INITIAL  3.  Pt will increased BUE strength to 4+/5 in order to lift items to complete cooking and cleaning tasks.   Goal status: INITIAL  4.  Pt will increase BUE grip strength by 10# and pinch strength by 3#.   Goal status: INITIAL  5.  Pt will improve BUE coordination to 40" or less in order to manipulate small items during dressing and bathing tasks with no compensatory strategies or AE   Goal status: INITIAL    ASSESSMENT:  CLINICAL IMPRESSION: Patient is a 67 y.o. female who was seen today for occupational therapy evaluation for s/p hepatic encephalopathy. Pt presents with increased weakness, poor coordination, fine motor deficits, and decreased activity tolerance.   PERFORMANCE DEFICITS: in functional skills including ADLs, IADLs, coordination, sensation, ROM, strength, pain, fascial restrictions, Fine motor control, Gross motor control, body mechanics, endurance, and UE functional use.  IMPAIRMENTS: are limiting patient from ADLs, IADLs, rest and sleep, leisure, and social participation.   COMORBIDITIES: has no other co-morbidities that affects occupational performance. Patient will benefit from skilled OT to address above impairments and improve overall function.  MODIFICATION OR ASSISTANCE TO COMPLETE EVALUATION: No modification of tasks or assist necessary to complete an evaluation.  OT OCCUPATIONAL PROFILE AND HISTORY: Problem focused assessment: Including review of records  relating to presenting problem.  CLINICAL DECISION MAKING: LOW - limited treatment options, no task modification necessary  REHAB POTENTIAL: Good  EVALUATION COMPLEXITY: Low      PLAN:  OT FREQUENCY: 1-2x/week  OT DURATION: 6 weeks  PLANNED INTERVENTIONS: 97535 self care/ADL training, 91478 therapeutic exercise, 97530 therapeutic activity, 97112 neuromuscular re-education, 97140 manual therapy, 97035 ultrasound, 97010 moist heat, 97032 electrical stimulation (manual), functional mobility training, energy conservation, coping strategies training, patient/family education, and DME and/or AE instructions  RECOMMENDED OTHER SERVICES: PT  CONSULTED AND AGREED WITH PLAN OF CARE: Patient  PLAN FOR NEXT SESSION: Manual Therapy, P/ROM, AA/ROM, Proximal Shoulder Exercises, Grip and pinch strength   Trish Mage, OTR/L [facility] [phone] Thelma Viana Rosemarie Beath, OT 06/01/2023, 7:38 AM

## 2023-06-01 ENCOUNTER — Inpatient Hospital Stay: Payer: Medicare Other

## 2023-06-01 DIAGNOSIS — D561 Beta thalassemia: Secondary | ICD-10-CM | POA: Diagnosis not present

## 2023-06-01 DIAGNOSIS — D61818 Other pancytopenia: Secondary | ICD-10-CM

## 2023-06-01 LAB — CBC
HCT: 24.2 % — ABNORMAL LOW (ref 36.0–46.0)
Hemoglobin: 7.5 g/dL — ABNORMAL LOW (ref 12.0–15.0)
MCH: 22.5 pg — ABNORMAL LOW (ref 26.0–34.0)
MCHC: 31 g/dL (ref 30.0–36.0)
MCV: 72.7 fL — ABNORMAL LOW (ref 80.0–100.0)
Platelets: 151 10*3/uL (ref 150–400)
RBC: 3.33 MIL/uL — ABNORMAL LOW (ref 3.87–5.11)
RDW: 21.8 % — ABNORMAL HIGH (ref 11.5–15.5)
WBC: 4.1 10*3/uL (ref 4.0–10.5)
nRBC: 0.5 % — ABNORMAL HIGH (ref 0.0–0.2)

## 2023-06-01 LAB — SAMPLE TO BLOOD BANK

## 2023-06-01 MED ORDER — HEPARIN SOD (PORK) LOCK FLUSH 100 UNIT/ML IV SOLN
500.0000 [IU] | Freq: Once | INTRAVENOUS | Status: AC
  Administered 2023-06-01: 500 [IU] via INTRAVENOUS

## 2023-06-01 MED ORDER — SODIUM CHLORIDE 0.9% FLUSH
10.0000 mL | Freq: Once | INTRAVENOUS | Status: AC
  Administered 2023-06-01: 10 mL via INTRAVENOUS

## 2023-06-01 NOTE — Progress Notes (Signed)
Patients port flushed without difficulty.  Good blood return noted with no bruising or swelling noted at site.  Band aid applied.  VSS with discharge and left in satisfactory condition with no s/s of distress noted.   

## 2023-06-05 ENCOUNTER — Ambulatory Visit (HOSPITAL_COMMUNITY): Payer: Medicare Other | Admitting: Occupational Therapy

## 2023-06-05 ENCOUNTER — Encounter (HOSPITAL_COMMUNITY): Payer: Self-pay | Admitting: Occupational Therapy

## 2023-06-05 DIAGNOSIS — M25611 Stiffness of right shoulder, not elsewhere classified: Secondary | ICD-10-CM | POA: Diagnosis not present

## 2023-06-05 DIAGNOSIS — R278 Other lack of coordination: Secondary | ICD-10-CM

## 2023-06-05 DIAGNOSIS — R29898 Other symptoms and signs involving the musculoskeletal system: Secondary | ICD-10-CM

## 2023-06-05 DIAGNOSIS — M25612 Stiffness of left shoulder, not elsewhere classified: Secondary | ICD-10-CM

## 2023-06-05 NOTE — Patient Instructions (Signed)
Perform each exercise ____10-15____ reps. 2-3x days.   1) Protraction   Start by holding a wand or cane at chest height.  Next, slowly push the wand outwards in front of your body so that your elbows become fully straightened. Then, return to the original position.     2) Shoulder FLEXION   In the standing position, hold a wand/cane with both arms, palms down on both sides. Raise up the wand/cane allowing your unaffected arm to perform most of the effort. Your affected arm should be partially relaxed.      3) Internal/External ROTATION   In the standing position, hold a wand/cane with both hands keeping your elbows bent. Move your arms and wand/cane to one side.  Your affected arm should be partially relaxed while your unaffected arm performs most of the effort.       4) Shoulder ABDUCTION   While holding a wand/cane palm face up on the injured side and palm face down on the uninjured side, slowly raise up your injured arm to the side.        5) Horizontal Abduction/Adduction      Straight arms holding cane at shoulder height, bring cane to right, center, left. Repeat starting to left.   Copyright  VHI. All rights reserved.      

## 2023-06-05 NOTE — Therapy (Signed)
OUTPATIENT OCCUPATIONAL THERAPY ORTHO TREATMENT  Patient Name: Rachel Huber MRN: [id number] DOB:26-Feb-1956, 67 y.o., female Today's Date: 06/05/2023  PCP: Einar Crow, MD REFERRING PROVIDER: Synthia Innocent, NP  END OF SESSION:  OT End of Session - 06/05/23 1127     Visit Number 2    Number of Visits 9    Date for OT Re-Evaluation 07/03/23    Authorization Type Medicare Part A and B    OT Start Time 1015    OT Stop Time 1100    OT Time Calculation (min) 45 min    Activity Tolerance Patient tolerated treatment well    Behavior During Therapy WFL for tasks assessed/performed             Past Medical History:  Diagnosis Date   Anemia in chronic kidney disease (CKD) 02/07/2022   Anxiety    Arthritis    Ascites of liver    Weekly paracentesis   Cirrhosis of liver (HCC)    CKD (chronic kidney disease) stage 4, GFR 15-29 ml/min (HCC)    GERD (gastroesophageal reflux disease)    Grade IV internal hemorrhoids    Hypertension    NASH (nonalcoholic steatohepatitis)    Neuropathy, diabetic (HCC)    Obesity (BMI 30-39.9)    OSA on CPAP    Pneumonia    Portal hypertension (HCC)    S/P TIPS (transjugular intrahepatic portosystemic shunt)    Thalassemia minor 1992   Past Surgical History:  Procedure Laterality Date   ABDOMINAL HYSTERECTOMY     BREAST BIOPSY Right 02/15/2018   [place] bx 6-6:30 ribbon shape, ONE CORE FRAGMENT WITH FIBROSIS. ONE CORE FRAGMENT WITH PORTION OF A DILATED   BREAST BIOPSY Right 02/15/2018   [place] bx 9:00 heart shape, USUAL DUCTAL HYPERPLASIA   BREAST LUMPECTOMY Right 03/09/2018   Procedure: BREAST LUMPECTOMY x 2;  Surgeon: Sung Amabile, DO;  Location: [facility];  Service: General;  Laterality: Right;   CATARACT EXTRACTION W/PHACO Left 06/11/2016   Procedure: CATARACT EXTRACTION PHACO AND INTRAOCULAR LENS PLACEMENT (IOC);  Surgeon: Sallee Lange, MD;  Location: [facility];  Service: Ophthalmology;  Laterality: Left;  Lot #  [id number] H US:01:38.6 AP%:26.4 CDE:44.15   CATARACT EXTRACTION W/PHACO Right 06/15/2018   Procedure: CATARACT EXTRACTION PHACO AND INTRAOCULAR LENS PLACEMENT (IOC);  Surgeon: Galen Manila, MD;  Location: [facility];  Service: Ophthalmology;  Laterality: Right;  [place] 00:38.2 CDE 4.23 Fluid Pack Lot # [id number]   COLONOSCOPIES     COLONOSCOPY WITH PROPOFOL N/A 10/10/2020   Procedure: COLONOSCOPY WITH PROPOFOL;  Surgeon: Regis Bill, MD;  Location: [facility]COPY;  Service: Endoscopy;  Laterality: N/A;   COLONOSCOPY WITH PROPOFOL N/A 11/20/2020   Procedure: COLONOSCOPY WITH PROPOFOL;  Surgeon: Regis Bill, MD;  Location: [facility]OSCOPY;  Service: Endoscopy;  Laterality: N/A;  DM STAT CBC, BMP COVID POSITIVE 09/02/2020   COLONOSCOPY WITH PROPOFOL N/A 06/19/2022   Procedure: COLONOSCOPY WITH PROPOFOL;  Surgeon: Regis Bill, MD;  Location: [facility] ENDOSCOPY;  Service: Endoscopy;  Laterality: N/A;   CTR     ESOPHAGOGASTRODUODENOSCOPY (EGD) WITH PROPOFOL N/A 10/10/2020   Procedure: ESOPHAGOGASTRODUODENOSCOPY (EGD) WITH PROPOFOL;  Surgeon: Regis Bill, MD;  Location: [facility] ENDOSCOPY;  Service: Endoscopy;  Laterality: N/A;  COVID POSITIVE 10/08/2020   FLEXIBLE SIGMOIDOSCOPY N/A 02/06/2022   Procedure: FLEXIBLE SIGMOIDOSCOPY;  Surgeon: Regis Bill, MD;  Location: [facility] ENDOSCOPY;  Service: Endoscopy;  Laterality: N/A;  Patient requests anesthesia   FLEXIBLE SIGMOIDOSCOPY N/A 12/19/2022   Procedure: FLEXIBLE SIGMOIDOSCOPY;  Surgeon:  Dolores Frame, MD;  Location: [facility];  Service: Gastroenterology;  Laterality: N/A;   IR ANGIOGRAM SELECTIVE EACH ADDITIONAL VESSEL  02/07/2022   IR ANGIOGRAM SELECTIVE EACH ADDITIONAL VESSEL  02/07/2022   IR ANGIOGRAM SELECTIVE EACH ADDITIONAL VESSEL  09/05/2022   IR ANGIOGRAM VISCERAL SELECTIVE  02/07/2022   IR ANGIOGRAM VISCERAL SELECTIVE  09/05/2022   IR CV LINE INJECTION  02/24/2023   IR EMBO ART  VEN HEMORR LYMPH EXTRAV  INC  GUIDE ROADMAPPING  09/05/2022   IR EMBO ART  VEN HEMORR LYMPH EXTRAV  INC GUIDE ROADMAPPING  11/05/2022   IR EMBO ARTERIAL NOT HEMORR HEMANG INC GUIDE ROADMAPPING  02/07/2022   IR EMBO TUMOR ORGAN ISCHEMIA INFARCT INC GUIDE ROADMAPPING  01/06/2023   IR IMAGING GUIDED PORT INSERTION  06/13/2022   IR IMAGING GUIDED PORT INSERTION  11/25/2022   IR PARACENTESIS  12/17/2021   IR PARACENTESIS  03/25/2022   IR PARACENTESIS  11/25/2022   IR PARACENTESIS  01/06/2023   IR PARACENTESIS  01/19/2023   IR RADIOLOGIST EVAL & MGMT  03/18/2022   IR RADIOLOGIST EVAL & MGMT  05/07/2022   IR RADIOLOGIST EVAL & MGMT  05/16/2022   IR RADIOLOGIST EVAL & MGMT  10/13/2022   IR RADIOLOGIST EVAL & MGMT  12/12/2022   IR RADIOLOGIST EVAL & MGMT  03/30/2023   IR US GUIDE VASC ACCESS RIGHT  02/07/2022   IR US GUIDE VASC ACCESS RIGHT  09/05/2022   JOINT REPLACEMENT     KNEE SURGERY Right 09/24/2017   plates and pins   PORT-A-CATH REMOVAL N/A 09/23/2022   Procedure: MINOR REMOVAL PORT-A-CATH;  Surgeon: Franky Macho, MD;  Location: [facility];  Service: General;  Laterality: N/A;   TEE WITHOUT CARDIOVERSION N/A 09/23/2022   Procedure: TRANSESOPHAGEAL ECHOCARDIOGRAM (TEE);  Surgeon: Antoine Poche, MD;  Location: [facility];  Service: Endoscopy;  Laterality: N/A;   TOTAL SHOULDER ARTHROPLASTY Right 09/27/2015   Patient Active Problem List   Diagnosis Date Noted   Beta thalassemia intermedia (HCC) 05/25/2023   Gout due to renal impairment 05/12/2023   Unspecified protein-calorie malnutrition (HCC) 05/06/2023   Hypoalbuminemia due to protein-calorie malnutrition (HCC) 05/05/2023   Vitamin D deficiency 05/05/2023   Bilateral lower extremity edema 05/05/2023   Aortic atherosclerosis (HCC) 05/05/2023   AMS (altered mental status) 04/29/2023   Hepatic encephalopathy (HCC) 03/25/2023   Acute metabolic encephalopathy 03/25/2023   Acute cystitis with hematuria 03/25/2023   Acute hepatic encephalopathy (HCC) 01/19/2023   Pancytopenia (HCC)  01/06/2023   Anemia 01/06/2023   Internal hemorrhoid 01/06/2023   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   Symptomatic anemia 12/17/2022   Hypokalemia 11/06/2022   Acute on chronic anemia 11/03/2022   Hypotension 11/03/2022   AKI (acute kidney injury) (HCC) 10/15/2022   Rectal bleeding 06/17/2022   Obesity (BMI 30-39.9) 06/17/2022   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 mellitus (HCC) 06/17/2022   Grade IV internal hemorrhoids 05/16/2022   Thrombocytopenia (HCC) 03/05/2022   Anemia in chronic kidney disease (CKD) 02/07/2022   GI bleeding 02/04/2022   Chronic kidney disease, stage 3b (HCC) 02/04/2022   Depression with anxiety 02/04/2022   Liver cirrhosis secondary to NASH (HCC)    Insomnia 01/27/2022   Iron deficiency anemia due to chronic blood loss 02/13/2021   History of uterine cancer 02/13/2021   Decompensated hepatic cirrhosis (HCC) 10/25/2020   OSA on CPAP 10/25/2020   Closed right hip fracture (HCC) 09/02/2020   Type 2 diabetes mellitus with hyperlipidemia (HCC) 09/02/2020  Thalassemia minor    HLD (hyperlipidemia)    Depression     ONSET DATE: 04/29/23  REFERRING DIAG: Weakness following Hepatic Encephalopathy  THERAPY DIAG:  Stiffness of left shoulder, not elsewhere classified  Other symptoms and signs involving the musculoskeletal system  Other lack of coordination  Rationale for Evaluation and Treatment: Rehabilitation  SUBJECTIVE:   SUBJECTIVE STATEMENT: "My shoulder and neck hurts today" Pt accompanied by: self  PERTINENT HISTORY: 2.5 weeks at [facility] following a week in the Hospital due to Hepatic Encephalopathy. PMH significant for diabetes mellitus hypertension [name] status post TIPS revision at [facility] weekly paracentesis recurrent grade 4 internal hemorrhoids status post embolization of superior rectal artery 11/05/2022, multifactorial anemia, OSA with CPAP, CKD stage III, thrombocytopenia/leukopenia due to splenic sequestration/cirrhosis, beta  thalassemia intermedia per hematology/oncology, hyperlipidemia, chronic orthostasis with ProAmatine.   PRECAUTIONS: Fall  WEIGHT BEARING RESTRICTIONS: No  PAIN:  Are you having pain? Yes: NPRS scale: 7/10 Pain location: shoulders and neck Pain description: aching Aggravating factors: moving Relieving factors: moving/medication  FALLS: Has patient fallen in last 6 months? Yes. Number of falls 1  LIVING ENVIRONMENT: Lives with: lives with their family Lives in: House/apartment  PLOF: Independent with basic ADLs and Independent with household mobility with device  PATIENT GOALS: To get stronger  NEXT MD VISIT: 06/05/23  OBJECTIVE:  Note: Objective measures were completed at Evaluation unless otherwise noted.  HAND DOMINANCE: Right  ADLs: Overall ADLs: Due to weakness and fatigue pt requires assist with all IADL's and compensatory strategies for BADL's and supervision from husband  UPPER EXTREMITY ROM:     Active ROM Right eval  Shoulder flexion 118  Shoulder abduction 99  Shoulder internal rotation 90  Shoulder external rotation 13  (Blank rows = not tested)   UPPER EXTREMITY MMT:     MMT Right eval Left eval  Shoulder flexion 4-/5 4+/5  Shoulder abduction 4-/5 4/5  Shoulder internal rotation 4-/5 4/5  Shoulder external rotation 3+/5 4/5  Elbow flexion 4+/5 4+/5  Elbow extension 4+/5 4+/5  (Blank rows = not tested)  HAND FUNCTION: Grip strength: Right: 42 lbs; Left: 24 lbs, Lateral pinch: Right: 9 lbs, Left: 8 lbs, and 3 point pinch: Right: 8 lbs, Left: 6 lbs  COORDINATION: 9 Hole Peg test: Right: 42.64 sec; Left: 1 min 3 sec  SENSATION: WFL  EDEMA: No swelling noted  OBSERVATIONS: Increased tremors noted in BUE   TODAY'S TREATMENT:                                                                                                                              DATE: 05/29/23: Evaluation Only  06/05/23 -Manual techniques: manual techniques to BL  shoulder/deltoids, trapezius to release fascial restrictions, decrease pain and increase ROM -P/ROM: BL shoulder, supine; flexion, abduction, external internal rotation 10x  -AA/ROM: BL shoulder, supine; flexion, abduction, external internal rotation 10x      PATIENT EDUCATION: Education details: Will  Start first session Person educated: Patient Education method: Explanation Education comprehension: verbalized understanding  HOME EXERCISE PROGRAM: 10/18- AA/ROM  GOALS: Goals reviewed with patient? No  SHORT TERM GOALS: Target date: 07/03/23  Pt will be provided and educated on HEP for BUE mobility and strength for ADL completion.   Goal status: INITIAL  2.  Pt will decrease pain in BUE to 3/10 or less in order to sleep for 3+ consecutive hours without waking due to pain.   Goal status: INITIAL  3.  Pt will increased BUE strength to 4+/5 in order to lift items to complete cooking and cleaning tasks.   Goal status: INITIAL  4.  Pt will increase BUE grip strength by 10# and pinch strength by 3#.   Goal status: INITIAL  5.  Pt will improve BUE coordination to 40" or less in order to manipulate small items during dressing and bathing tasks with no compensatory strategies or AE   Goal status: INITIAL    ASSESSMENT:  CLINICAL IMPRESSION: Pt reports that she continues to have pain in BL UE and neck pain. Began manual techniques to decrease facial restrictions, noted moderate fascial restrictions in L trapezius. Initiated P/ROM and AA/ROM this session. Gave pt handouts to work on AA/ROM at home.   PERFORMANCE DEFICITS: in functional skills including ADLs, IADLs, coordination, sensation, ROM, strength, pain, fascial restrictions, Fine motor control, Gross motor control, body mechanics, endurance, and UE functional use.  IMPAIRMENTS: are limiting patient from ADLs, IADLs, rest and sleep, leisure, and social participation.   COMORBIDITIES: has no other co-morbidities that  affects occupational performance. Patient will benefit from skilled OT to address above impairments and improve overall function.  MODIFICATION OR ASSISTANCE TO COMPLETE EVALUATION: No modification of tasks or assist necessary to complete an evaluation.  OT OCCUPATIONAL PROFILE AND HISTORY: Problem focused assessment: Including review of records relating to presenting problem.  CLINICAL DECISION MAKING: LOW - limited treatment options, no task modification necessary  REHAB POTENTIAL: Good  EVALUATION COMPLEXITY: Low      PLAN:  OT FREQUENCY: 1-2x/week  OT DURATION: 6 weeks  PLANNED INTERVENTIONS: 97535 self care/ADL training, 82956 therapeutic exercise, 97530 therapeutic activity, 97112 neuromuscular re-education, 97140 manual therapy, 97035 ultrasound, 97010 moist heat, 97032 electrical stimulation (manual), functional mobility training, energy conservation, coping strategies training, patient/family education, and DME and/or AE instructions  RECOMMENDED OTHER SERVICES: PT  CONSULTED AND AGREED WITH PLAN OF CARE: Patient  PLAN FOR NEXT SESSION: Manual Therapy, P/ROM, AA/ROM, Proximal Shoulder Exercises, Grip and pinch strength    [facility] [phone] Bevelyn Ngo, OT 06/05/2023, 12:08 PM

## 2023-06-08 ENCOUNTER — Inpatient Hospital Stay: Payer: Medicare Other

## 2023-06-08 DIAGNOSIS — D561 Beta thalassemia: Secondary | ICD-10-CM | POA: Diagnosis not present

## 2023-06-08 DIAGNOSIS — D61818 Other pancytopenia: Secondary | ICD-10-CM

## 2023-06-08 LAB — CBC
HCT: 23.7 % — ABNORMAL LOW (ref 36.0–46.0)
Hemoglobin: 7.3 g/dL — ABNORMAL LOW (ref 12.0–15.0)
MCH: 22.4 pg — ABNORMAL LOW (ref 26.0–34.0)
MCHC: 30.8 g/dL (ref 30.0–36.0)
MCV: 72.7 fL — ABNORMAL LOW (ref 80.0–100.0)
Platelets: 186 10*3/uL (ref 150–400)
RBC: 3.26 MIL/uL — ABNORMAL LOW (ref 3.87–5.11)
RDW: 21.7 % — ABNORMAL HIGH (ref 11.5–15.5)
WBC: 6 10*3/uL (ref 4.0–10.5)
nRBC: 0 % (ref 0.0–0.2)

## 2023-06-08 LAB — SAMPLE TO BLOOD BANK

## 2023-06-08 MED ORDER — HEPARIN SOD (PORK) LOCK FLUSH 100 UNIT/ML IV SOLN
500.0000 [IU] | Freq: Once | INTRAVENOUS | Status: AC
  Administered 2023-06-08: 500 [IU] via INTRAVENOUS

## 2023-06-08 MED ORDER — SODIUM CHLORIDE 0.9% FLUSH
10.0000 mL | INTRAVENOUS | Status: DC | PRN
  Administered 2023-06-08: 10 mL via INTRAVENOUS

## 2023-06-08 NOTE — Progress Notes (Signed)
HGB 7.3. No blood products needed at this time. Standing orders to keep HGB above 7. Patient called and made aware.

## 2023-06-08 NOTE — Progress Notes (Signed)
Rachel Huber presented for Portacath access and flush with labs. Portacath located right chest wall accessed with  H 20 needle.  Good blood return present. Portacath flushed with 20ml NS and 500U/32ml Heparin and needle removed intact. No swelling or bruising noted at the site. Procedure tolerated well and without incident.     Discharged from clinic via wheelchair in stable condition. Alert and oriented x 3. F/U with [facility] as scheduled.

## 2023-06-09 ENCOUNTER — Other Ambulatory Visit: Payer: Self-pay | Admitting: Gastroenterology

## 2023-06-09 ENCOUNTER — Ambulatory Visit
Admission: RE | Admit: 2023-06-09 | Discharge: 2023-06-09 | Disposition: A | Discharge status: Home / Self Care | Payer: Medicare Other | Source: Ambulatory Visit | Attending: Gastroenterology | Admitting: Gastroenterology

## 2023-06-09 DIAGNOSIS — K7581 Nonalcoholic steatohepatitis (NASH): Secondary | ICD-10-CM | POA: Diagnosis present

## 2023-06-09 DIAGNOSIS — K7460 Unspecified cirrhosis of liver: Secondary | ICD-10-CM | POA: Insufficient documentation

## 2023-06-09 NOTE — Progress Notes (Signed)
Patient presents for  therapeutic paracentesis. [place] limited shows trace amount of peritoneal  fluid noted  Insufficient to perform a safe paracentesis. Procedure not performed.

## 2023-06-11 ENCOUNTER — Ambulatory Visit (HOSPITAL_COMMUNITY): Payer: Medicare Other

## 2023-06-12 ENCOUNTER — Encounter (HOSPITAL_COMMUNITY): Payer: Self-pay | Admitting: Occupational Therapy

## 2023-06-12 ENCOUNTER — Ambulatory Visit (HOSPITAL_COMMUNITY): Payer: Medicare Other | Admitting: Occupational Therapy

## 2023-06-12 DIAGNOSIS — M25612 Stiffness of left shoulder, not elsewhere classified: Secondary | ICD-10-CM

## 2023-06-12 DIAGNOSIS — R29898 Other symptoms and signs involving the musculoskeletal system: Secondary | ICD-10-CM

## 2023-06-12 DIAGNOSIS — M25611 Stiffness of right shoulder, not elsewhere classified: Secondary | ICD-10-CM

## 2023-06-12 DIAGNOSIS — R278 Other lack of coordination: Secondary | ICD-10-CM

## 2023-06-12 NOTE — Therapy (Signed)
OUTPATIENT OCCUPATIONAL THERAPY ORTHO TREATMENT  Patient Name: Rachel Huber MRN: [id number] DOB:10-Jan-1956, 67 y.o., female Today's Date: 06/12/2023  PCP: Einar Crow, MD REFERRING PROVIDER: Synthia Innocent, NP  END OF SESSION:  OT End of Session - 06/12/23 1232     Visit Number 3    Number of Visits 9    Date for OT Re-Evaluation 07/03/23    Authorization Type Medicare Part A and B    OT Start Time 1152    OT Stop Time 1235    OT Time Calculation (min) 43 min    Activity Tolerance Patient tolerated treatment well    Behavior During Therapy WFL for tasks assessed/performed              Past Medical History:  Diagnosis Date   Anemia in chronic kidney disease (CKD) 02/07/2022   Anxiety    Arthritis    Ascites of liver    Weekly paracentesis   Cirrhosis of liver (HCC)    CKD (chronic kidney disease) stage 4, GFR 15-29 ml/min (HCC)    GERD (gastroesophageal reflux disease)    Grade IV internal hemorrhoids    Hypertension    NASH (nonalcoholic steatohepatitis)    Neuropathy, diabetic (HCC)    Obesity (BMI 30-39.9)    OSA on CPAP    Pneumonia    Portal hypertension (HCC)    S/P TIPS (transjugular intrahepatic portosystemic shunt)    Thalassemia minor 1992   Past Surgical History:  Procedure Laterality Date   ABDOMINAL HYSTERECTOMY     BREAST BIOPSY Right 02/15/2018   [place] bx 6-6:30 ribbon shape, ONE CORE FRAGMENT WITH FIBROSIS. ONE CORE FRAGMENT WITH PORTION OF A DILATED   BREAST BIOPSY Right 02/15/2018   [place] bx 9:00 heart shape, USUAL DUCTAL HYPERPLASIA   BREAST LUMPECTOMY Right 03/09/2018   Procedure: BREAST LUMPECTOMY x 2;  Surgeon: Sung Amabile, DO;  Location: [facility];  Service: General;  Laterality: Right;   CATARACT EXTRACTION W/PHACO Left 06/11/2016   Procedure: CATARACT EXTRACTION PHACO AND INTRAOCULAR LENS PLACEMENT (IOC);  Surgeon: Sallee Lange, MD;  Location: [facility];  Service: Ophthalmology;  Laterality: Left;  Lot #  [id number] H US:01:38.6 AP%:26.4 CDE:44.15   CATARACT EXTRACTION W/PHACO Right 06/15/2018   Procedure: CATARACT EXTRACTION PHACO AND INTRAOCULAR LENS PLACEMENT (IOC);  Surgeon: Galen Manila, MD;  Location: [facility];  Service: Ophthalmology;  Laterality: Right;  [place] 00:38.2 CDE 4.23 Fluid Pack Lot # [id number]   COLONOSCOPIES     COLONOSCOPY WITH PROPOFOL N/A 10/10/2020   Procedure: COLONOSCOPY WITH PROPOFOL;  Surgeon: Regis Bill, MD;  Location: [facility] ENDOSCOPY;  Service: Endoscopy;  Laterality: N/A;   COLONOSCOPY WITH PROPOFOL N/A 11/20/2020   Procedure: COLONOSCOPY WITH PROPOFOL;  Surgeon: Regis Bill, MD;  Location: [facility]OSCOPY;  Service: Endoscopy;  Laterality: N/A;  DM STAT CBC, BMP COVID POSITIVE 09/02/2020   COLONOSCOPY WITH PROPOFOL N/A 06/19/2022   Procedure: COLONOSCOPY WITH PROPOFOL;  Surgeon: Regis Bill, MD;  Location: [facility] ENDOSCOPY;  Service: Endoscopy;  Laterality: N/A;   CTR     ESOPHAGOGASTRODUODENOSCOPY (EGD) WITH PROPOFOL N/A 10/10/2020   Procedure: ESOPHAGOGASTRODUODENOSCOPY (EGD) WITH PROPOFOL;  Surgeon: Regis Bill, MD;  Location: [facility] ENDOSCOPY;  Service: Endoscopy;  Laterality: N/A;  COVID POSITIVE 10/08/2020   FLEXIBLE SIGMOIDOSCOPY N/A 02/06/2022   Procedure: FLEXIBLE SIGMOIDOSCOPY;  Surgeon: Regis Bill, MD;  Location: [facility] ENDOSCOPY;  Service: Endoscopy;  Laterality: N/A;  Patient requests anesthesia   FLEXIBLE SIGMOIDOSCOPY N/A 12/19/2022   Procedure: FLEXIBLE SIGMOIDOSCOPY;  Surgeon: Marguerita Merles, Reuel Boom, MD;  Location: [facility];  Service: Gastroenterology;  Laterality: N/A;   IR ANGIOGRAM SELECTIVE EACH ADDITIONAL VESSEL  02/07/2022   IR ANGIOGRAM SELECTIVE EACH ADDITIONAL VESSEL  02/07/2022   IR ANGIOGRAM SELECTIVE EACH ADDITIONAL VESSEL  09/05/2022   IR ANGIOGRAM VISCERAL SELECTIVE  02/07/2022   IR ANGIOGRAM VISCERAL SELECTIVE  09/05/2022   IR CV LINE INJECTION  02/24/2023   IR EMBO ART  VEN HEMORR LYMPH EXTRAV  INC  GUIDE ROADMAPPING  09/05/2022   IR EMBO ART  VEN HEMORR LYMPH EXTRAV  INC GUIDE ROADMAPPING  11/05/2022   IR EMBO ARTERIAL NOT HEMORR HEMANG INC GUIDE ROADMAPPING  02/07/2022   IR EMBO TUMOR ORGAN ISCHEMIA INFARCT INC GUIDE ROADMAPPING  01/06/2023   IR IMAGING GUIDED PORT INSERTION  06/13/2022   IR IMAGING GUIDED PORT INSERTION  11/25/2022   IR PARACENTESIS  12/17/2021   IR PARACENTESIS  03/25/2022   IR PARACENTESIS  11/25/2022   IR PARACENTESIS  01/06/2023   IR PARACENTESIS  01/19/2023   IR RADIOLOGIST EVAL & MGMT  03/18/2022   IR RADIOLOGIST EVAL & MGMT  05/07/2022   IR RADIOLOGIST EVAL & MGMT  05/16/2022   IR RADIOLOGIST EVAL & MGMT  10/13/2022   IR RADIOLOGIST EVAL & MGMT  12/12/2022   IR RADIOLOGIST EVAL & MGMT  03/30/2023   IR US GUIDE VASC ACCESS RIGHT  02/07/2022   IR US GUIDE VASC ACCESS RIGHT  09/05/2022   JOINT REPLACEMENT     KNEE SURGERY Right 09/24/2017   plates and pins   PORT-A-CATH REMOVAL N/A 09/23/2022   Procedure: MINOR REMOVAL PORT-A-CATH;  Surgeon: Franky Macho, MD;  Location: [facility];  Service: General;  Laterality: N/A;   TEE WITHOUT CARDIOVERSION N/A 09/23/2022   Procedure: TRANSESOPHAGEAL ECHOCARDIOGRAM (TEE);  Surgeon: Antoine Poche, MD;  Location: [facility];  Service: Endoscopy;  Laterality: N/A;   TOTAL SHOULDER ARTHROPLASTY Right 09/27/2015   Patient Active Problem List   Diagnosis Date Noted   Beta thalassemia intermedia (HCC) 05/25/2023   Gout due to renal impairment 05/12/2023   Unspecified protein-calorie malnutrition (HCC) 05/06/2023   Hypoalbuminemia due to protein-calorie malnutrition (HCC) 05/05/2023   Vitamin D deficiency 05/05/2023   Bilateral lower extremity edema 05/05/2023   Aortic atherosclerosis (HCC) 05/05/2023   AMS (altered mental status) 04/29/2023   Hepatic encephalopathy (HCC) 03/25/2023   Acute metabolic encephalopathy 03/25/2023   Acute cystitis with hematuria 03/25/2023   Acute hepatic encephalopathy (HCC) 01/19/2023   Pancytopenia (HCC)  01/06/2023   Anemia 01/06/2023   Internal hemorrhoid 01/06/2023   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   Symptomatic anemia 12/17/2022   Hypokalemia 11/06/2022   Acute on chronic anemia 11/03/2022   Hypotension 11/03/2022   AKI (acute kidney injury) (HCC) 10/15/2022   Rectal bleeding 06/17/2022   Obesity (BMI 30-39.9) 06/17/2022   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 mellitus (HCC) 06/17/2022   Grade IV internal hemorrhoids 05/16/2022   Thrombocytopenia (HCC) 03/05/2022   Anemia in chronic kidney disease (CKD) 02/07/2022   GI bleeding 02/04/2022   Chronic kidney disease, stage 3b (HCC) 02/04/2022   Depression with anxiety 02/04/2022   Liver cirrhosis secondary to NASH (HCC)    Insomnia 01/27/2022   Iron deficiency anemia due to chronic blood loss 02/13/2021   History of uterine cancer 02/13/2021   Decompensated hepatic cirrhosis (HCC) 10/25/2020   OSA on CPAP 10/25/2020   Closed right hip fracture (HCC) 09/02/2020   Type 2 diabetes mellitus with hyperlipidemia (HCC) 09/02/2020  Thalassemia minor    HLD (hyperlipidemia)    Depression     ONSET DATE: 04/29/23  REFERRING DIAG: Weakness following Hepatic Encephalopathy  THERAPY DIAG:  Stiffness of left shoulder, not elsewhere classified  Other symptoms and signs involving the musculoskeletal system  Other lack of coordination  Shoulder stiffness, right  Rationale for Evaluation and Treatment: Rehabilitation  SUBJECTIVE:   SUBJECTIVE STATEMENT: "My shoulder and neck hurts today" Pt accompanied by: self  PERTINENT HISTORY: 2.5 weeks at [facility] following a week in the Hospital due to Hepatic Encephalopathy. PMH significant for diabetes mellitus hypertension [name] status post TIPS revision at [facility] weekly paracentesis recurrent grade 4 internal hemorrhoids status post embolization of superior rectal artery 11/05/2022, multifactorial anemia, OSA with CPAP, CKD stage III, thrombocytopenia/leukopenia due to splenic  sequestration/cirrhosis, beta thalassemia intermedia per hematology/oncology, hyperlipidemia, chronic orthostasis with ProAmatine.   PRECAUTIONS: Fall  WEIGHT BEARING RESTRICTIONS: No  PAIN:  Are you having pain? Yes: NPRS scale: 7/10 Pain location: shoulders and neck Pain description: aching Aggravating factors: moving Relieving factors: moving/medication  FALLS: Has patient fallen in last 6 months? Yes. Number of falls 1  LIVING ENVIRONMENT: Lives with: lives with their family Lives in: House/apartment  PLOF: Independent with basic ADLs and Independent with household mobility with device  PATIENT GOALS: To get stronger  NEXT MD VISIT: 06/05/23  OBJECTIVE:  Note: Objective measures were completed at Evaluation unless otherwise noted.  HAND DOMINANCE: Right  ADLs: Overall ADLs: Due to weakness and fatigue pt requires assist with all IADL's and compensatory strategies for BADL's and supervision from husband  UPPER EXTREMITY ROM:     Active ROM Right eval  Shoulder flexion 118  Shoulder abduction 99  Shoulder internal rotation 90  Shoulder external rotation 13  (Blank rows = not tested)   UPPER EXTREMITY MMT:     MMT Right eval Left eval  Shoulder flexion 4-/5 4+/5  Shoulder abduction 4-/5 4/5  Shoulder internal rotation 4-/5 4/5  Shoulder external rotation 3+/5 4/5  Elbow flexion 4+/5 4+/5  Elbow extension 4+/5 4+/5  (Blank rows = not tested)  HAND FUNCTION: Grip strength: Right: 42 lbs; Left: 24 lbs, Lateral pinch: Right: 9 lbs, Left: 8 lbs, and 3 point pinch: Right: 8 lbs, Left: 6 lbs  COORDINATION: 9 Hole Peg test: Right: 42.64 sec; Left: 1 min 3 sec  SENSATION: WFL  EDEMA: No swelling noted  OBSERVATIONS: Increased tremors noted in BUE   TODAY'S TREATMENT:                                                                                                                              DATE: 05/29/23: Evaluation Only  06/12/23 -Manual techniques:  manual techniques to BL shoulder/deltoids, trapezius to release fascial restrictions, decrease pain and increase ROM -P/ROM: supine, BL shoulder, supine; flexion, abduction, external internal rotation 10x  -AA/ROM: supine, BL shoulder, supine; flexion, abduction, external internal rotation 10x  A/ROM: supine,  BL shoulder, supine; flexion, abduction, external internal rotation 10x A/ROM: seated; flexion, abduction 5x each   06/05/23 -Manual techniques: manual techniques to BL shoulder/deltoids, trapezius to release fascial restrictions, decrease pain and increase ROM -P/ROM: BL shoulder, supine; flexion, abduction, external internal rotation 10x  -AA/ROM: BL shoulder, supine; flexion, abduction, external internal rotation 10x      PATIENT EDUCATION: Education details: Will Start first session Person educated: Patient Education method: Explanation Education comprehension: verbalized understanding  HOME EXERCISE PROGRAM: 10/18- AA/ROM  GOALS: Goals reviewed with patient? No  SHORT TERM GOALS: Target date: 07/03/23  Pt will be provided and educated on HEP for BUE mobility and strength for ADL completion.   Goal status: INITIAL  2.  Pt will decrease pain in BUE to 3/10 or less in order to sleep for 3+ consecutive hours without waking due to pain.   Goal status: INITIAL  3.  Pt will increased BUE strength to 4+/5 in order to lift items to complete cooking and cleaning tasks.   Goal status: INITIAL  4.  Pt will increase BUE grip strength by 10# and pinch strength by 3#.   Goal status: INITIAL  5.  Pt will improve BUE coordination to 40" or less in order to manipulate small items during dressing and bathing tasks with no compensatory strategies or AE   Goal status: INITIAL    ASSESSMENT:  CLINICAL IMPRESSION: Pt reports continued pain with BL Ue's. Continued with manual and P/ROM. Pt demonstrates increased ROM with LUE and 50% ROM with abduction on RUE. Pt reports that  she has been completing AA/ROM at home without difficulty. Added A/ROM into session and HEP. VC required for seated ROM due to posterior lean.   PERFORMANCE DEFICITS: in functional skills including ADLs, IADLs, coordination, sensation, ROM, strength, pain, fascial restrictions, Fine motor control, Gross motor control, body mechanics, endurance, and UE functional use.  IMPAIRMENTS: are limiting patient from ADLs, IADLs, rest and sleep, leisure, and social participation.   COMORBIDITIES: has no other co-morbidities that affects occupational performance. Patient will benefit from skilled OT to address above impairments and improve overall function.  MODIFICATION OR ASSISTANCE TO COMPLETE EVALUATION: No modification of tasks or assist necessary to complete an evaluation.  OT OCCUPATIONAL PROFILE AND HISTORY: Problem focused assessment: Including review of records relating to presenting problem.  CLINICAL DECISION MAKING: LOW - limited treatment options, no task modification necessary  REHAB POTENTIAL: Good  EVALUATION COMPLEXITY: Low      PLAN:  OT FREQUENCY: 1-2x/week  OT DURATION: 6 weeks  PLANNED INTERVENTIONS: 97535 self care/ADL training, 40981 therapeutic exercise, 97530 therapeutic activity, 97112 neuromuscular re-education, 97140 manual therapy, 97035 ultrasound, 97010 moist heat, 97032 electrical stimulation (manual), functional mobility training, energy conservation, coping strategies training, patient/family education, and DME and/or AE instructions  RECOMMENDED OTHER SERVICES: PT  CONSULTED AND AGREED WITH PLAN OF CARE: Patient  PLAN FOR NEXT SESSION: Manual Therapy, P/ROM, AA/ROM, Proximal Shoulder Exercises, Grip and pinch strength    [facility] [phone] Bevelyn Ngo, OT 06/12/2023, 12:33 PM

## 2023-06-12 NOTE — Patient Instructions (Signed)

## 2023-06-15 ENCOUNTER — Inpatient Hospital Stay: Payer: Medicare Other

## 2023-06-15 VITALS — BP 112/43 | HR 80 | Temp 98.7°F | Resp 18 | Wt 226.4 lb

## 2023-06-15 DIAGNOSIS — Z95828 Presence of other vascular implants and grafts: Secondary | ICD-10-CM

## 2023-06-15 DIAGNOSIS — D561 Beta thalassemia: Secondary | ICD-10-CM

## 2023-06-15 DIAGNOSIS — D61818 Other pancytopenia: Secondary | ICD-10-CM

## 2023-06-15 LAB — CBC
HCT: 22.3 % — ABNORMAL LOW (ref 36.0–46.0)
Hemoglobin: 6.8 g/dL — CL (ref 12.0–15.0)
MCH: 22 pg — ABNORMAL LOW (ref 26.0–34.0)
MCHC: 30.5 g/dL (ref 30.0–36.0)
MCV: 72.2 fL — ABNORMAL LOW (ref 80.0–100.0)
Platelets: 172 10*3/uL (ref 150–400)
RBC: 3.09 MIL/uL — ABNORMAL LOW (ref 3.87–5.11)
RDW: 21.3 % — ABNORMAL HIGH (ref 11.5–15.5)
WBC: 5.2 10*3/uL (ref 4.0–10.5)
nRBC: 0.4 % — ABNORMAL HIGH (ref 0.0–0.2)

## 2023-06-15 LAB — SAMPLE TO BLOOD BANK

## 2023-06-15 LAB — PREPARE RBC (CROSSMATCH)

## 2023-06-15 MED ORDER — SODIUM CHLORIDE 0.9% FLUSH
10.0000 mL | INTRAVENOUS | Status: DC | PRN
  Administered 2023-06-15: 10 mL via INTRAVENOUS

## 2023-06-15 MED ORDER — HEPARIN SOD (PORK) LOCK FLUSH 100 UNIT/ML IV SOLN
500.0000 [IU] | Freq: Once | INTRAVENOUS | Status: AC
  Administered 2023-06-15: 500 [IU] via INTRAVENOUS

## 2023-06-15 MED ORDER — LUSPATERCEPT-AAMT 75 MG ~~LOC~~ SOLR
1.0000 mg/kg | Freq: Once | SUBCUTANEOUS | Status: AC
Start: 2023-06-15 — End: 2023-06-15
  Administered 2023-06-15: 90 mg via SUBCUTANEOUS
  Filled 2023-06-15: qty 1.8

## 2023-06-15 NOTE — Progress Notes (Signed)
Patient presents today for possible Reblozyl injection.  Patient is in satisfactory condition with no new complaints voiced.  Vital signs are stable. Hemoglobin today is 6.8.  Rojelio Brenner, PA-C made aware.  We will give 1 unit of PRBC tomorrow per provider.  We will proceed with Reblozyl injection per provider orders.   Patient tolerated Reblozyl injection well with no complaints voiced.  Patient left via wheelchair with husband in stable condition.  Vital signs stable at discharge.  Follow up as scheduled.

## 2023-06-15 NOTE — Patient Instructions (Signed)
[facility]  Discharge Instructions: Thank you for choosing [facility] to provide your oncology and hematology care.  If you have a lab appointment with the Cancer Center - please note that after April 8th, 2024, all labs will be drawn in the cancer center.  You do not have to check in or register with the main entrance as you have in the past but will complete your check-in in the cancer center.  Wear comfortable clothing and clothing appropriate for easy access to any Portacath or PICC line.   We strive to give you quality time with your provider. You may need to reschedule your appointment if you arrive late (15 or more minutes).  Arriving late affects you and other patients whose appointments are after yours.  Also, if you miss three or more appointments without notifying the office, you may be dismissed from the clinic at the provider's discretion.      For prescription refill requests, have your pharmacy contact our office and allow 72 hours for refills to be completed.    Today you received the following: Reblozyl.  Luspatercept Injection What is this medication? LUSPATERCEPT (lus PAT er sept) treats low levels of red blood cells (anemia) in the body in people with beta thalassemia or myelodysplastic syndromes. It works by helping the body make more red blood cells. This medicine may be used for other purposes; ask your health care provider or pharmacist if you have questions. COMMON BRAND NAME(S): REBLOZYL What should I tell my care team before I take this medication? They need to know if you have any of these conditions: Have had your spleen removed High blood pressure History of blood clots Tobacco use An unusual or allergic reaction to luspatercept, other medications, foods, dyes, or preservatives Pregnant or trying to get pregnant Breastfeeding How should I use this medication? This medication is injected under the skin. It is given by your  care team in a hospital or clinic setting. Talk to your care team about the use of the medication in children. It is not approved for use in children. Overdosage: If you think you have taken too much of this medicine contact a poison control center or emergency room at once. NOTE: This medicine is only for you. Do not share this medicine with others. What if I miss a dose? Keep appointments for follow-up doses. It is important not to miss your dose. Call your care team if you are unable to keep an appointment. What may interact with this medication? Interactions are not expected. This list may not describe all possible interactions. Give your health care provider a list of all the medicines, herbs, non-prescription drugs, or dietary supplements you use. Also tell them if you smoke, drink alcohol, or use illegal drugs. Some items may interact with your medicine. What should I watch for while using this medication? Your condition will be monitored carefully while you are receiving this medication. You may need blood work done while you are taking this medication. Talk to your care team if you may be pregnant. Serious birth defects can occur if you take this medication during pregnancy. Contraception is recommended while taking this medication. Your care team can help you find the option that works for you. Talk to your care team before breastfeeding. Changes to your treatment plan may be needed. What side effects may I notice from receiving this medication? Side effects that you should report to your care team as soon as possible:  Allergic reactions--skin rash, itching, hives, swelling of the face, lips, tongue, or throat Blood clot--pain, swelling, or warmth in the leg, shortness of breath, chest pain Increase in blood pressure Severe back pain, numbness or weakness of the hands, arms, legs, or feet, loss of coordination, loss of bowel or bladder control Side effects that usually do not require  medical attention (report these to your care team if they continue or are bothersome): Bone pain Dizziness Fatigue Headache Joint pain Muscle pain Stomach pain This list may not describe all possible side effects. Call your doctor for medical advice about side effects. You may report side effects to FDA at 1-800-FDA-1088. Where should I keep my medication? This medication is given in a hospital or clinic. It will not be stored at home. NOTE: This sheet is a summary. It may not cover all possible information. If you have questions about this medicine, talk to your doctor, pharmacist, or health care provider.  2024 Elsevier/Gold Standard (2023-01-02 00:00:00)      To help prevent nausea and vomiting after your treatment, we encourage you to take your nausea medication as directed.  BELOW ARE SYMPTOMS THAT SHOULD BE REPORTED IMMEDIATELY: *FEVER GREATER THAN 100.4 F (38 C) OR HIGHER *CHILLS OR SWEATING *NAUSEA AND VOMITING THAT IS NOT CONTROLLED WITH YOUR NAUSEA MEDICATION *UNUSUAL SHORTNESS OF BREATH *UNUSUAL BRUISING OR BLEEDING *URINARY PROBLEMS (pain or burning when urinating, or frequent urination) *BOWEL PROBLEMS (unusual diarrhea, constipation, pain near the anus) TENDERNESS IN MOUTH AND THROAT WITH OR WITHOUT PRESENCE OF ULCERS (sore throat, sores in mouth, or a toothache) UNUSUAL RASH, SWELLING OR PAIN  UNUSUAL VAGINAL DISCHARGE OR ITCHING   Items with * indicate a potential emergency and should be followed up as soon as possible or go to the Emergency Department if any problems should occur.  Please show the CHEMOTHERAPY ALERT CARD or IMMUNOTHERAPY ALERT CARD at check-in to the Emergency Department and triage nurse.  Should you have questions after your visit or need to cancel or reschedule your appointment, please contact [facility] [phone]  and follow the prompts.  Office hours are 8:00 a.m. to 4:30 p.m. Monday - Friday. Please note that  voicemails left after 4:00 p.m. may not be returned until the following business day.  We are closed weekends and major holidays. You have access to a nurse at all times for urgent questions. Please call the main number to the clinic [phone] and follow the prompts.  For any non-urgent questions, you may also contact your provider using MyChart. We now offer e-Visits for anyone 69 and older to request care online for non-urgent symptoms. For details visit [email or web address].   Also download the MyChart app! Go to the app store, search "MyChart", open the app, select [place], and log in with your MyChart username and password.

## 2023-06-15 NOTE — Progress Notes (Signed)
Patients port flushed without difficulty.  Good blood return noted with no bruising or swelling noted at site.  Band aid applied.  VSS with discharge and left in satisfactory condition with no s/s of distress noted.   

## 2023-06-16 ENCOUNTER — Inpatient Hospital Stay: Payer: Medicare Other

## 2023-06-16 DIAGNOSIS — D561 Beta thalassemia: Secondary | ICD-10-CM | POA: Diagnosis not present

## 2023-06-16 MED ORDER — HEPARIN SOD (PORK) LOCK FLUSH 100 UNIT/ML IV SOLN
500.0000 [IU] | Freq: Every day | INTRAVENOUS | Status: AC | PRN
  Administered 2023-06-16: 500 [IU]

## 2023-06-16 MED ORDER — SODIUM CHLORIDE 0.9% IV SOLUTION
250.0000 mL | INTRAVENOUS | Status: DC
  Administered 2023-06-16: 250 mL via INTRAVENOUS

## 2023-06-16 MED ORDER — SODIUM CHLORIDE 0.9% FLUSH
10.0000 mL | INTRAVENOUS | Status: AC | PRN
  Administered 2023-06-16: 10 mL

## 2023-06-16 NOTE — Progress Notes (Signed)
Patient took Tylenol from home and refused Benadryl for transfusion.   Patient tolerated transfusion with no complaints voiced.  Side effects with management reviewed with understanding verbalized.  Port site clean and dry with no bruising or swelling noted at site.  Good blood return noted before and after administration.  Band aid applied.  Patient left in satisfactory condition with VSS and no s/s of distress noted.

## 2023-06-16 NOTE — Patient Instructions (Signed)

## 2023-06-17 LAB — TYPE AND SCREEN
ABO/RH(D): A POS
Antibody Screen: NEGATIVE
Unit division: 0

## 2023-06-17 LAB — BPAM RBC
Blood Product Expiration Date: 202411142359
ISSUE DATE / TIME: 202410291135
Unit Type and Rh: 6200

## 2023-06-19 ENCOUNTER — Ambulatory Visit (HOSPITAL_COMMUNITY): Payer: Medicare Other | Attending: Gastroenterology | Admitting: Occupational Therapy

## 2023-06-19 ENCOUNTER — Ambulatory Visit (HOSPITAL_COMMUNITY): Payer: Medicare Other | Admitting: Physical Therapy

## 2023-06-19 ENCOUNTER — Encounter (HOSPITAL_COMMUNITY): Payer: Self-pay | Admitting: Occupational Therapy

## 2023-06-19 ENCOUNTER — Other Ambulatory Visit: Payer: Self-pay

## 2023-06-19 DIAGNOSIS — R278 Other lack of coordination: Secondary | ICD-10-CM | POA: Insufficient documentation

## 2023-06-19 DIAGNOSIS — M25612 Stiffness of left shoulder, not elsewhere classified: Secondary | ICD-10-CM | POA: Insufficient documentation

## 2023-06-19 DIAGNOSIS — R262 Difficulty in walking, not elsewhere classified: Secondary | ICD-10-CM | POA: Diagnosis present

## 2023-06-19 DIAGNOSIS — R2689 Other abnormalities of gait and mobility: Secondary | ICD-10-CM | POA: Insufficient documentation

## 2023-06-19 DIAGNOSIS — M25611 Stiffness of right shoulder, not elsewhere classified: Secondary | ICD-10-CM | POA: Insufficient documentation

## 2023-06-19 DIAGNOSIS — M6281 Muscle weakness (generalized): Secondary | ICD-10-CM | POA: Diagnosis present

## 2023-06-19 DIAGNOSIS — R29898 Other symptoms and signs involving the musculoskeletal system: Secondary | ICD-10-CM | POA: Insufficient documentation

## 2023-06-19 NOTE — Therapy (Signed)
OUTPATIENT PHYSICAL THERAPY LOWER EXTREMITY EVALUATION   Patient Name: Rachel Huber MRN: [id number] DOB:[birth year], 67 y.o., female Today's Date: 06/19/2023  END OF SESSION:  PT End of Session - 06/19/23 1107     Visit Number 1    Number of Visits 16    Date for PT Re-Evaluation 08/18/23    Authorization Type Medicare A/ Secondary: [employer]    Progress Note Due on Visit 10    PT Start Time 1110    PT Stop Time 1145    PT Time Calculation (min) 35 min    Activity Tolerance Patient tolerated treatment well    Behavior During Therapy WFL for tasks assessed/performed             Past Medical History:  Diagnosis Date   Anemia in chronic kidney disease (CKD) 02/07/2022   Anxiety    Arthritis    Ascites of liver    Weekly paracentesis   Cirrhosis of liver (HCC)    CKD (chronic kidney disease) stage 4, GFR 15-29 ml/min (HCC)    GERD (gastroesophageal reflux disease)    Grade IV internal hemorrhoids    Hypertension    NASH (nonalcoholic steatohepatitis)    Neuropathy, diabetic (HCC)    Obesity (BMI 30-39.9)    OSA on CPAP    Pneumonia    Portal hypertension (HCC)    S/P TIPS (transjugular intrahepatic portosystemic shunt)    Thalassemia minor 1992   Past Surgical History:  Procedure Laterality Date   ABDOMINAL HYSTERECTOMY     BREAST BIOPSY Right 02/15/2018   [place] bx 6-6:30 ribbon shape, ONE CORE FRAGMENT WITH FIBROSIS. ONE CORE FRAGMENT WITH PORTION OF A DILATED   BREAST BIOPSY Right 02/15/2018   [place] bx 9:00 heart shape, USUAL DUCTAL HYPERPLASIA   BREAST LUMPECTOMY Right 03/09/2018   Procedure: BREAST LUMPECTOMY x 2;  Surgeon: Sung Amabile, DO;  Location: [facility];  Service: General;  Laterality: Right;   CATARACT EXTRACTION W/PHACO Left 06/11/2016   Procedure: CATARACT EXTRACTION PHACO AND INTRAOCULAR LENS PLACEMENT (IOC);  Surgeon: Sallee Lange, MD;  Location: [facility];  Service: Ophthalmology;  Laterality: Left;  Lot # [id number] H US:01:38.6 AP%:26.4 CDE:44.15    CATARACT EXTRACTION W/PHACO Right 06/15/2018   Procedure: CATARACT EXTRACTION PHACO AND INTRAOCULAR LENS PLACEMENT (IOC);  Surgeon: Galen Manila, MD;  Location: [facility];  Service: Ophthalmology;  Laterality: Right;  [place] 00:38.2 CDE 4.23 Fluid Pack Lot # [id number]   COLONOSCOPIES     COLONOSCOPY WITH PROPOFOL N/A 10/10/2020   Procedure: COLONOSCOPY WITH PROPOFOL;  Surgeon: Regis Bill, MD;  Location: [facility];  Service: Endoscopy;  Laterality: N/A;   COLONOSCOPY WITH PROPOFOL N/A 11/20/2020   Procedure: COLONOSCOPY WITH PROPOFOL;  Surgeon: Regis Bill, MD;  Location: [facility];  Service: Endoscopy;  Laterality: N/A;  DM STAT CBC, BMP COVID POSITIVE 09/02/2020   COLONOSCOPY WITH PROPOFOL N/A 06/19/2022   Procedure: COLONOSCOPY WITH PROPOFOL;  Surgeon: Regis Bill, MD;  Location: [facility];  Service: Endoscopy;  Laterality: N/A;   CTR     ESOPHAGOGASTRODUODENOSCOPY (EGD) WITH PROPOFOL N/A 10/10/2020   Procedure: ESOPHAGOGASTRODUODENOSCOPY (EGD) WITH PROPOFOL;  Surgeon: Regis Bill, MD;  Location: [facility];  Service: Endoscopy;  Laterality: N/A;  COVID POSITIVE 10/08/2020   FLEXIBLE SIGMOIDOSCOPY N/A 02/06/2022   Procedure: FLEXIBLE SIGMOIDOSCOPY;  Surgeon: Regis Bill, MD;  Location: [facility];  Service: Endoscopy;  Laterality: N/A;  Patient requests anesthesia   FLEXIBLE SIGMOIDOSCOPY N/A 12/19/2022   Procedure: FLEXIBLE SIGMOIDOSCOPY;  Surgeon: Marguerita Merles, Reuel Boom, MD;  Location: [facility];  Service: Gastroenterology;  Laterality: N/A;   IR ANGIOGRAM SELECTIVE EACH ADDITIONAL VESSEL  02/07/2022   IR ANGIOGRAM SELECTIVE EACH ADDITIONAL VESSEL  02/07/2022   IR ANGIOGRAM SELECTIVE EACH ADDITIONAL VESSEL  09/05/2022   IR ANGIOGRAM VISCERAL SELECTIVE  02/07/2022   IR ANGIOGRAM VISCERAL SELECTIVE  09/05/2022   IR CV LINE INJECTION  02/24/2023   IR EMBO ART  VEN HEMORR LYMPH EXTRAV  INC GUIDE ROADMAPPING  09/05/2022   IR EMBO ART   VEN HEMORR LYMPH EXTRAV  INC GUIDE ROADMAPPING  11/05/2022   IR EMBO ARTERIAL NOT HEMORR HEMANG INC GUIDE ROADMAPPING  02/07/2022   IR EMBO TUMOR ORGAN ISCHEMIA INFARCT INC GUIDE ROADMAPPING  01/06/2023   IR IMAGING GUIDED PORT INSERTION  06/13/2022   IR IMAGING GUIDED PORT INSERTION  11/25/2022   IR PARACENTESIS  12/17/2021   IR PARACENTESIS  03/25/2022   IR PARACENTESIS  11/25/2022   IR PARACENTESIS  01/06/2023   IR PARACENTESIS  01/19/2023   IR RADIOLOGIST EVAL & MGMT  03/18/2022   IR RADIOLOGIST EVAL & MGMT  05/07/2022   IR RADIOLOGIST EVAL & MGMT  05/16/2022   IR RADIOLOGIST EVAL & MGMT  10/13/2022   IR RADIOLOGIST EVAL & MGMT  12/12/2022   IR RADIOLOGIST EVAL & MGMT  03/30/2023   IR US GUIDE VASC ACCESS RIGHT  02/07/2022   IR US GUIDE VASC ACCESS RIGHT  09/05/2022   JOINT REPLACEMENT     KNEE SURGERY Right 09/24/2017   plates and pins   PORT-A-CATH REMOVAL N/A 09/23/2022   Procedure: MINOR REMOVAL PORT-A-CATH;  Surgeon: Franky Macho, MD;  Location: [facility];  Service: General;  Laterality: N/A;   TEE WITHOUT CARDIOVERSION N/A 09/23/2022   Procedure: TRANSESOPHAGEAL ECHOCARDIOGRAM (TEE);  Surgeon: Antoine Poche, MD;  Location: [facility];  Service: Endoscopy;  Laterality: N/A;   TOTAL SHOULDER ARTHROPLASTY Right 09/27/2015   Patient Active Problem List   Diagnosis Date Noted   Beta thalassemia intermedia (HCC) 05/25/2023   Gout due to renal impairment 05/12/2023   Unspecified protein-calorie malnutrition (HCC) 05/06/2023   Hypoalbuminemia due to protein-calorie malnutrition (HCC) 05/05/2023   Vitamin D deficiency 05/05/2023   Bilateral lower extremity edema 05/05/2023   Aortic atherosclerosis (HCC) 05/05/2023   AMS (altered mental status) 04/29/2023   Hepatic encephalopathy (HCC) 03/25/2023   Acute metabolic encephalopathy 03/25/2023   Acute cystitis with hematuria 03/25/2023   Acute hepatic encephalopathy (HCC) 01/19/2023   Pancytopenia (HCC) 01/06/2023   Anemia 01/06/2023   Internal hemorrhoid  01/06/2023   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   Symptomatic anemia 12/17/2022   Hypokalemia 11/06/2022   Acute on chronic anemia 11/03/2022   Hypotension 11/03/2022   AKI (acute kidney injury) (HCC) 10/15/2022   Rectal bleeding 06/17/2022   Obesity (BMI 30-39.9) 06/17/2022   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 mellitus (HCC) 06/17/2022   Grade IV internal hemorrhoids 05/16/2022   Thrombocytopenia (HCC) 03/05/2022   Anemia in chronic kidney disease (CKD) 02/07/2022   GI bleeding 02/04/2022   Chronic kidney disease, stage 3b (HCC) 02/04/2022   Depression with anxiety 02/04/2022   Liver cirrhosis secondary to NASH (HCC)    Insomnia 01/27/2022   Iron deficiency anemia due to chronic blood loss 02/13/2021   History of uterine cancer 02/13/2021   Decompensated hepatic cirrhosis (HCC) 10/25/2020   OSA on CPAP 10/25/2020   Closed right hip fracture (HCC) 09/02/2020   Type 2 diabetes mellitus with hyperlipidemia (HCC) 09/02/2020  Thalassemia minor    HLD (hyperlipidemia)    Depression     PCP: Lauro Regulus, MD Sharee Holster, NP none none   REFERRING PROVIDER: Sharee Holster, NP  REFERRING DIAG: hepatic encephalopathy, muscle weakness, difficulty walking  THERAPY DIAG:  Muscle weakness (generalized)  Other abnormalities of gait and mobility  Difficulty in walking, not elsewhere classified  Rationale for Evaluation and Treatment: Rehabilitation  ONSET DATE:  PT was seen 2 months ago for 4 visits and then was readmitted into the hospital from 9/11-9/16.  SUBJECTIVE:   SUBJECTIVE STATEMENT: PT states that she has been using a combination of the rollator and cane in her home.  Pt states that she can not really stand for any length of time. After about three minutes she will start to go forward due pain in her back as well as weakness.    PERTINENT HISTORY: Fx Rt hip, back pain  PAIN:  Are you having pain? Yes: NPRS scale: 0/10; worst back pain is an 8/10 after  being up 5 minutes Pain location: low back Rt greater than LT  Pain description:  throbbing  Aggravating factors: standing  Relieving factors: sitting   PRECAUTIONS: Fall     WEIGHT BEARING RESTRICTIONS: No  FALLS:  Has patient fallen in last 6 months? Yes. Number of falls 1  LIVING ENVIRONMENT: Lives with: lives with their family Lives in: House/apartment Stairs: No Has following equipment at home: Retail banker - 4 wheeled  OCCUPATION: retired  PLOF: Independent with basic ADLs  PATIENT GOALS: To walk better without assistance, be able to stand longer   NEXT MD VISIT: unknown at this time   OBJECTIVE:  Note: Objective measures were completed at Evaluation unless otherwise noted.  DIAGNOSTIC FINDINGS:  05/02/23 MRI HEAD WITHOUT CONTRAST  IMPRESSION: 1. 2.2 x 1.5 cm heavily calcified meningioma in the right middle cranial fossa with some mass effect on the lateral aspect of the right temporal tip without significant signal change. 2. Otherwise normal MRI appearance of the brain for age. No acute intracranial abnormality to explain altered mental status. 3. Right greater than left mastoid effusions. No obstructing nasopharyngeal lesion is present. 4. Scattered sinus disease as described.   COGNITION: Overall cognitive status: Within functional limits for tasks assessed     POSTURE: rounded shoulders, forward head, decreased lumbar lordosis, and increased thoracic kyphosis   LOWER EXTREMITY ROM:   Active ROM Right eval Left eval  Hip flexion    Hip extension    Hip abduction    Hip adduction    Hip internal rotation    Hip external rotation    Knee flexion    Knee extension    Ankle dorsiflexion    Ankle plantarflexion    Ankle inversion    Ankle eversion     (Blank rows = not tested)  LOWER EXTREMITY MMT:  MMT Right eval Left eval  Hip flexion 5 4  Hip extension 4 tested side lying  4+ tested side lying  Hip abduction 3- 3+   Hip adduction    Hip internal rotation    Hip external rotation    Knee flexion 4+ tested side lying  4+ tested side lying  Knee extension 5 5  Ankle dorsiflexion 3+ 3+  Ankle plantarflexion    Ankle inversion    Ankle eversion     (Blank rows = not tested)  FUNCTIONAL TESTS:   5 times sit to stand: 4 x in  30"  2 minute walk test: 150 ft with rollator  Single leg stance:   Unable     TODAY'S TREATMENT:                                                                                                                              DATE:  06/19/23 Evaluation and patient HEP   PATIENT EDUCATION:  Education details:  Person educated: Patient Education method: Chief Technology Officer Education comprehension: verbalized understanding and returned demonstration  HOME EXERCISE PROGRAM: Access Code: [email or web address] URL: [email or web address] Date: 06/19/2023 Prepared by: Virgina Organ  Exercises - Sit to Stand Without Arm Support  - 2 x daily - 7 x weekly - 1 sets - 5-10 reps - 5 seconds  hold - Sidelying Hip Abduction  - 2 x daily - 7 x weekly - 1 sets - 10 reps - 3-5 seconds  hold - Supine Bridge  - 2 x daily - 7 x weekly - 1 sets - 10 reps - 3-5 seconds  hold  ASSESSMENT:  CLINICAL IMPRESSION:67 y.o. 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 Re admitted in September and is now being referred to skilled  OP PT.   Patient is a 67 y.o. female who was seen today for physical therapy evaluation and treatment for hepatic encephalopathy, muscle weakness, and difficulty  walking. Patient's condition is further defined by  due to pain, weakness, and decreased soft tissue extensibility. Skilled PT is required to address the impairments and functional limitations listed below.   OBJECTIVE IMPAIRMENTS: Abnormal gait, decreased activity tolerance, decreased balance, decreased endurance, decreased mobility, difficulty walking, decreased strength, improper body mechanics, postural dysfunction, and pain.   ACTIVITY LIMITATIONS: carrying, lifting, standing, squatting, stairs, bed mobility, and locomotion level  PARTICIPATION LIMITATIONS: cleaning, laundry, driving, shopping, and community activity  PERSONAL FACTORS: Time since onset of injury/illness/exacerbation and 1-2 comorbidities: CKD, hip fx , OA  are also affecting patient's functional outcome.   REHAB POTENTIAL: Good  CLINICAL DECISION MAKING: Evolving/moderate complexity  EVALUATION COMPLEXITY: Moderate   GOALS: Goals reviewed with patient? No  SHORT TERM GOALS: Target date: 07/17/23 PT to be I in HEP in order to increase her LE strength 1/2 grade to be able to come sit to stand with ease.   Baseline: Goal status: INITIAL  2.  PT to be able to walk 200 ft in 2 minutes with lease assistive device to demonstrate increased stability when walking  Baseline:  Goal status: INITIAL  3.  PT to be able to stand for 10 minutes in order to begin simple light housework activities.  Baseline:  Goal status:  INITIAL  4.  Pt to be able to single leg stance for 10" each to reduce risk of falling Baseline:  Goal status: INITIAL   LONG TERM GOALS: Target date: 08/14/23  PT to be I in HEP in order to increase her LE strength 1 grade to be able to go up and down 3 steps in a reciprocal manner.  Goal status: INITIAL  2.  PT to be able to walk 250 ft in 2 minutes with lease assistive device to demonstrate increased stability when walking  Baseline:  Goal status: INITIAL  3.  PT to be able to stand for 15  minutes in order to begin cooking tasks/ cleaning at home.  Baseline:  Goal status: INITIAL  4.  Pt to be able to single leg stance for 10" each to reduce risk of falling Baseline:  Goal status: INITIAL     PLAN:  PT FREQUENCY: 2x/week  PT DURATION: 8 weeks  PLANNED INTERVENTIONS: 97110-Therapeutic exercises, 97530- Therapeutic activity, [id number]- Neuromuscular re-education, 97535- Self Care, [id number]- Manual therapy, [phone]- Gait training, Patient/Family education, and [phone]- PT Re-evaluation  PLAN FOR NEXT SESSION: Begin strengthening and balance activities.   Virgina Organ, PT CLT [phone]  06/19/2023, 3:45 PM

## 2023-06-19 NOTE — Therapy (Signed)
OUTPATIENT OCCUPATIONAL THERAPY ORTHO TREATMENT  Patient Name: Rachel Huber MRN: [id number] DOB:1956/01/16, 67 y.o., female Today's Date: 06/19/2023  PCP: Einar Crow, MD REFERRING PROVIDER: Synthia Innocent, NP  END OF SESSION:  OT End of Session - 06/19/23 1234     Visit Number 4    Number of Visits 9    Date for OT Re-Evaluation 07/03/23    Authorization Type Medicare Part A and B    OT Start Time 1150    OT Stop Time 1234    OT Time Calculation (min) 44 min    Activity Tolerance Patient tolerated treatment well    Behavior During Therapy WFL for tasks assessed/performed             Past Medical History:  Diagnosis Date   Anemia in chronic kidney disease (CKD) 02/07/2022   Anxiety    Arthritis    Ascites of liver    Weekly paracentesis   Cirrhosis of liver (HCC)    CKD (chronic kidney disease) stage 4, GFR 15-29 ml/min (HCC)    GERD (gastroesophageal reflux disease)    Grade IV internal hemorrhoids    Hypertension    NASH (nonalcoholic steatohepatitis)    Neuropathy, diabetic (HCC)    Obesity (BMI 30-39.9)    OSA on CPAP    Pneumonia    Portal hypertension (HCC)    S/P TIPS (transjugular intrahepatic portosystemic shunt)    Thalassemia minor 1992   Past Surgical History:  Procedure Laterality Date   ABDOMINAL HYSTERECTOMY     BREAST BIOPSY Right 02/15/2018   [place] bx 6-6:30 ribbon shape, ONE CORE FRAGMENT WITH FIBROSIS. ONE CORE FRAGMENT WITH PORTION OF A DILATED   BREAST BIOPSY Right 02/15/2018   [place] bx 9:00 heart shape, USUAL DUCTAL HYPERPLASIA   BREAST LUMPECTOMY Right 03/09/2018   Procedure: BREAST LUMPECTOMY x 2;  Surgeon: Sung Amabile, DO;  Location: [facility];  Service: General;  Laterality: Right;   CATARACT EXTRACTION W/PHACO Left 06/11/2016   Procedure: CATARACT EXTRACTION PHACO AND INTRAOCULAR LENS PLACEMENT (IOC);  Surgeon: Sallee Lange, MD;  Location: [facility];  Service: Ophthalmology;  Laterality: Left;  Lot #  [id number] H US:01:38.6 AP%:26.4 CDE:44.15   CATARACT EXTRACTION W/PHACO Right 06/15/2018   Procedure: CATARACT EXTRACTION PHACO AND INTRAOCULAR LENS PLACEMENT (IOC);  Surgeon: Galen Manila, MD;  Location: [facility];  Service: Ophthalmology;  Laterality: Right;  [place] 00:38.2 CDE 4.23 Fluid Pack Lot # [id number]   COLONOSCOPIES     COLONOSCOPY WITH PROPOFOL N/A 10/10/2020   Procedure: COLONOSCOPY WITH PROPOFOL;  Surgeon: Regis Bill, MD;  Location: [facility];  Service: Endoscopy;  Laterality: N/A;   COLONOSCOPY WITH PROPOFOL N/A 11/20/2020   Procedure: COLONOSCOPY WITH PROPOFOL;  Surgeon: Regis Bill, MD;  Location: [facility];  Service: Endoscopy;  Laterality: N/A;  DM STAT CBC, BMP COVID POSITIVE 09/02/2020   COLONOSCOPY WITH PROPOFOL N/A 06/19/2022   Procedure: COLONOSCOPY WITH PROPOFOL;  Surgeon: Regis Bill, MD;  Location: [facility];  Service: Endoscopy;  Laterality: N/A;   CTR     ESOPHAGOGASTRODUODENOSCOPY (EGD) WITH PROPOFOL N/A 10/10/2020   Procedure: ESOPHAGOGASTRODUODENOSCOPY (EGD) WITH PROPOFOL;  Surgeon: Regis Bill, MD;  Location: [facility];  Service: Endoscopy;  Laterality: N/A;  COVID POSITIVE 10/08/2020   FLEXIBLE SIGMOIDOSCOPY N/A 02/06/2022   Procedure: FLEXIBLE SIGMOIDOSCOPY;  Surgeon: Regis Bill, MD;  Location: [facility];  Service: Endoscopy;  Laterality: N/A;  Patient requests anesthesia   FLEXIBLE SIGMOIDOSCOPY N/A 12/19/2022   Procedure: FLEXIBLE SIGMOIDOSCOPY;  Surgeon:  Dolores Frame, MD;  Location: [facility];  Service: Gastroenterology;  Laterality: N/A;   IR ANGIOGRAM SELECTIVE EACH ADDITIONAL VESSEL  02/07/2022   IR ANGIOGRAM SELECTIVE EACH ADDITIONAL VESSEL  02/07/2022   IR ANGIOGRAM SELECTIVE EACH ADDITIONAL VESSEL  09/05/2022   IR ANGIOGRAM VISCERAL SELECTIVE  02/07/2022   IR ANGIOGRAM VISCERAL SELECTIVE  09/05/2022   IR CV LINE INJECTION  02/24/2023   IR EMBO ART  VEN HEMORR LYMPH EXTRAV  INC  GUIDE ROADMAPPING  09/05/2022   IR EMBO ART  VEN HEMORR LYMPH EXTRAV  INC GUIDE ROADMAPPING  11/05/2022   IR EMBO ARTERIAL NOT HEMORR HEMANG INC GUIDE ROADMAPPING  02/07/2022   IR EMBO TUMOR ORGAN ISCHEMIA INFARCT INC GUIDE ROADMAPPING  01/06/2023   IR IMAGING GUIDED PORT INSERTION  06/13/2022   IR IMAGING GUIDED PORT INSERTION  11/25/2022   IR PARACENTESIS  12/17/2021   IR PARACENTESIS  03/25/2022   IR PARACENTESIS  11/25/2022   IR PARACENTESIS  01/06/2023   IR PARACENTESIS  01/19/2023   IR RADIOLOGIST EVAL & MGMT  03/18/2022   IR RADIOLOGIST EVAL & MGMT  05/07/2022   IR RADIOLOGIST EVAL & MGMT  05/16/2022   IR RADIOLOGIST EVAL & MGMT  10/13/2022   IR RADIOLOGIST EVAL & MGMT  12/12/2022   IR RADIOLOGIST EVAL & MGMT  03/30/2023   IR US GUIDE VASC ACCESS RIGHT  02/07/2022   IR US GUIDE VASC ACCESS RIGHT  09/05/2022   JOINT REPLACEMENT     KNEE SURGERY Right 09/24/2017   plates and pins   PORT-A-CATH REMOVAL N/A 09/23/2022   Procedure: MINOR REMOVAL PORT-A-CATH;  Surgeon: Franky Macho, MD;  Location: [facility];  Service: General;  Laterality: N/A;   TEE WITHOUT CARDIOVERSION N/A 09/23/2022   Procedure: TRANSESOPHAGEAL ECHOCARDIOGRAM (TEE);  Surgeon: Antoine Poche, MD;  Location: [facility];  Service: Endoscopy;  Laterality: N/A;   TOTAL SHOULDER ARTHROPLASTY Right 09/27/2015   Patient Active Problem List   Diagnosis Date Noted   Beta thalassemia intermedia (HCC) 05/25/2023   Gout due to renal impairment 05/12/2023   Unspecified protein-calorie malnutrition (HCC) 05/06/2023   Hypoalbuminemia due to protein-calorie malnutrition (HCC) 05/05/2023   Vitamin D deficiency 05/05/2023   Bilateral lower extremity edema 05/05/2023   Aortic atherosclerosis (HCC) 05/05/2023   AMS (altered mental status) 04/29/2023   Hepatic encephalopathy (HCC) 03/25/2023   Acute metabolic encephalopathy 03/25/2023   Acute cystitis with hematuria 03/25/2023   Acute hepatic encephalopathy (HCC) 01/19/2023   Pancytopenia (HCC)  01/06/2023   Anemia 01/06/2023   Internal hemorrhoid 01/06/2023   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   Symptomatic anemia 12/17/2022   Hypokalemia 11/06/2022   Acute on chronic anemia 11/03/2022   Hypotension 11/03/2022   AKI (acute kidney injury) (HCC) 10/15/2022   Rectal bleeding 06/17/2022   Obesity (BMI 30-39.9) 06/17/2022   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 mellitus (HCC) 06/17/2022   Grade IV internal hemorrhoids 05/16/2022   Thrombocytopenia (HCC) 03/05/2022   Anemia in chronic kidney disease (CKD) 02/07/2022   GI bleeding 02/04/2022   Chronic kidney disease, stage 3b (HCC) 02/04/2022   Depression with anxiety 02/04/2022   Liver cirrhosis secondary to NASH (HCC)    Insomnia 01/27/2022   Iron deficiency anemia due to chronic blood loss 02/13/2021   History of uterine cancer 02/13/2021   Decompensated hepatic cirrhosis (HCC) 10/25/2020   OSA on CPAP 10/25/2020   Closed right hip fracture (HCC) 09/02/2020   Type 2 diabetes mellitus with hyperlipidemia (HCC) 09/02/2020  Thalassemia minor    HLD (hyperlipidemia)    Depression     ONSET DATE: 04/29/23  REFERRING DIAG: Weakness following Hepatic Encephalopathy  THERAPY DIAG:  Stiffness of left shoulder, not elsewhere classified  Other symptoms and signs involving the musculoskeletal system  Shoulder stiffness, right  Other lack of coordination  Rationale for Evaluation and Treatment: Rehabilitation  SUBJECTIVE:   SUBJECTIVE STATEMENT: "My right shoulder has really been killing me. " Pt accompanied by: self  PERTINENT HISTORY: 2.5 weeks at [facility] following a week in the Hospital due to Hepatic Encephalopathy. PMH significant for diabetes mellitus hypertension [name] status post TIPS revision at [facility] weekly paracentesis recurrent grade 4 internal hemorrhoids status post embolization of superior rectal artery 11/05/2022, multifactorial anemia, OSA with CPAP, CKD stage III, thrombocytopenia/leukopenia  due to splenic sequestration/cirrhosis, beta thalassemia intermedia per hematology/oncology, hyperlipidemia, chronic orthostasis with ProAmatine.   PRECAUTIONS: Fall  WEIGHT BEARING RESTRICTIONS: No  PAIN:  Are you having pain? Yes: NPRS scale: 6/10 Pain location: shoulders and neck Pain description: aching Aggravating factors: moving Relieving factors: moving/medication  FALLS: Has patient fallen in last 6 months? Yes. Number of falls 1  LIVING ENVIRONMENT: Lives with: lives with their family Lives in: House/apartment  PLOF: Independent with basic ADLs and Independent with household mobility with device  PATIENT GOALS: To get stronger  NEXT MD VISIT: 06/05/23  OBJECTIVE:  Note: Objective measures were completed at Evaluation unless otherwise noted.  HAND DOMINANCE: Right  ADLs: Overall ADLs: Due to weakness and fatigue pt requires assist with all IADL's and compensatory strategies for BADL's and supervision from husband  UPPER EXTREMITY ROM:     Active ROM Right eval  Shoulder flexion 118  Shoulder abduction 99  Shoulder internal rotation 90  Shoulder external rotation 13  (Blank rows = not tested)   UPPER EXTREMITY MMT:     MMT Right eval Left eval  Shoulder flexion 4-/5 4+/5  Shoulder abduction 4-/5 4/5  Shoulder internal rotation 4-/5 4/5  Shoulder external rotation 3+/5 4/5  Elbow flexion 4+/5 4+/5  Elbow extension 4+/5 4+/5  (Blank rows = not tested)  HAND FUNCTION: Grip strength: Right: 42 lbs; Left: 24 lbs, Lateral pinch: Right: 9 lbs, Left: 8 lbs, and 3 point pinch: Right: 8 lbs, Left: 6 lbs  COORDINATION: 9 Hole Peg test: Right: 42.64 sec; Left: 1 min 3 sec  SENSATION: WFL  EDEMA: No swelling noted  OBSERVATIONS: Increased tremors noted in BUE   TODAY'S TREATMENT:                                                                                                                              DATE:   06/19/23 -Pulleys: flexion, abduction,  x90" each -AA/ROM: seated, protraction, flexion, abduction, horizontal abduction, er/IR, x10 -A/ROM: seated, flexion, abduction, protraction, horizontal abduction, er/IR, x10  06/12/23 -Manual techniques: manual techniques to BL shoulder/deltoids, trapezius to release fascial restrictions, decrease pain and increase ROM -P/ROM:  supine, BL shoulder, supine; flexion, abduction, external internal rotation 10x  -AA/ROM: supine, BL shoulder, supine; flexion, abduction, external internal rotation 10x  A/ROM: supine,  BL shoulder, supine; flexion, abduction, external internal rotation 10x A/ROM: seated; flexion, abduction 5x each   06/05/23 -Manual techniques: manual techniques to BL shoulder/deltoids, trapezius to release fascial restrictions, decrease pain and increase ROM -P/ROM: BL shoulder, supine; flexion, abduction, external internal rotation 10x  -AA/ROM: BL shoulder, supine; flexion, abduction, external internal rotation 10x      PATIENT EDUCATION: Education details: A/ROM Person educated: Patient Education method: Explanation Education comprehension: verbalized understanding  HOME EXERCISE PROGRAM: 10/18: AA/ROM 11/1: A/ROM  GOALS: Goals reviewed with patient? No  SHORT TERM GOALS: Target date: 07/03/23  Pt will be provided and educated on HEP for BUE mobility and strength for ADL completion.   Goal status: IN PROGRESS  2.  Pt will decrease pain in BUE to 3/10 or less in order to sleep for 3+ consecutive hours without waking due to pain.   Goal status: IN PROGRESS  3.  Pt will increased BUE strength to 4+/5 in order to lift items to complete cooking and cleaning tasks.   Goal status: IN PROGRESS  4.  Pt will increase BUE grip strength by 10# and pinch strength by 3#.   Goal status: IN PROGRESS  5.  Pt will improve BUE coordination to 40" or less in order to manipulate small items during dressing and bathing tasks with no compensatory strategies or AE   Goal  status: IN PROGRESS    ASSESSMENT:  CLINICAL IMPRESSION: This session pt has increased pain in the R shoulder with limited ROM. She continues to require increased time and cuing for all exercises, as well as limitations due to the pain this session. Verbal and tactile cuing provided for positioning and technique throughout session.   PERFORMANCE DEFICITS: in functional skills including ADLs, IADLs, coordination, sensation, ROM, strength, pain, fascial restrictions, Fine motor control, Gross motor control, body mechanics, endurance, and UE functional use.   PLAN:  OT FREQUENCY: 1-2x/week  OT DURATION: 6 weeks  PLANNED INTERVENTIONS: 97535 self care/ADL training, 16109 therapeutic exercise, 97530 therapeutic activity, 97112 neuromuscular re-education, 97140 manual therapy, 97035 ultrasound, 97010 moist heat, 97032 electrical stimulation (manual), functional mobility training, energy conservation, coping strategies training, patient/family education, and DME and/or AE instructions  RECOMMENDED OTHER SERVICES: PT  CONSULTED AND AGREED WITH PLAN OF CARE: Patient  PLAN FOR NEXT SESSION: Manual Therapy, P/ROM, AA/ROM, Proximal Shoulder Exercises, Grip and pinch strength    Trish Mage, OTR/L [facility] [phone] Dyllan Kats Rosemarie Beath, OT 06/19/2023, 1:02 PM

## 2023-06-22 ENCOUNTER — Inpatient Hospital Stay: Payer: Medicare Other | Attending: Hematology

## 2023-06-22 VITALS — BP 109/60 | HR 81 | Temp 97.7°F | Resp 20

## 2023-06-22 DIAGNOSIS — D561 Beta thalassemia: Secondary | ICD-10-CM | POA: Insufficient documentation

## 2023-06-22 DIAGNOSIS — Z8542 Personal history of malignant neoplasm of other parts of uterus: Secondary | ICD-10-CM | POA: Diagnosis not present

## 2023-06-22 DIAGNOSIS — D649 Anemia, unspecified: Secondary | ICD-10-CM

## 2023-06-22 DIAGNOSIS — Z95828 Presence of other vascular implants and grafts: Secondary | ICD-10-CM

## 2023-06-22 DIAGNOSIS — D61818 Other pancytopenia: Secondary | ICD-10-CM

## 2023-06-22 DIAGNOSIS — K7469 Other cirrhosis of liver: Secondary | ICD-10-CM | POA: Diagnosis not present

## 2023-06-22 LAB — CBC
HCT: 25.7 % — ABNORMAL LOW (ref 36.0–46.0)
Hemoglobin: 7.7 g/dL — ABNORMAL LOW (ref 12.0–15.0)
MCH: 22.2 pg — ABNORMAL LOW (ref 26.0–34.0)
MCHC: 30 g/dL (ref 30.0–36.0)
MCV: 74.1 fL — ABNORMAL LOW (ref 80.0–100.0)
Platelets: 142 10*3/uL — ABNORMAL LOW (ref 150–400)
RBC: 3.47 MIL/uL — ABNORMAL LOW (ref 3.87–5.11)
RDW: 22.6 % — ABNORMAL HIGH (ref 11.5–15.5)
WBC: 4.8 10*3/uL (ref 4.0–10.5)
nRBC: 0 % (ref 0.0–0.2)

## 2023-06-22 LAB — SAMPLE TO BLOOD BANK

## 2023-06-22 LAB — AMMONIA: Ammonia: 56 umol/L — ABNORMAL HIGH (ref 9–35)

## 2023-06-22 MED ORDER — SODIUM CHLORIDE 0.9% FLUSH
10.0000 mL | INTRAVENOUS | Status: DC | PRN
  Administered 2023-06-22: 10 mL via INTRAVENOUS

## 2023-06-22 MED ORDER — HEPARIN SOD (PORK) LOCK FLUSH 100 UNIT/ML IV SOLN
500.0000 [IU] | Freq: Once | INTRAVENOUS | Status: AC
Start: 2023-06-22 — End: 2023-06-22
  Administered 2023-06-22: 500 [IU] via INTRAVENOUS

## 2023-06-22 NOTE — Progress Notes (Signed)
HGB 7.7. No blood products needed per standing orders. Patient called and made aware.

## 2023-06-22 NOTE — Progress Notes (Signed)
Patients port flushed without difficulty.  Good blood return noted with no bruising or swelling noted at site.  Band aid applied.  VSS with discharge and left in satisfactory condition with no s/s of distress noted.   

## 2023-06-23 ENCOUNTER — Encounter (HOSPITAL_COMMUNITY): Payer: Medicare Other

## 2023-06-24 ENCOUNTER — Encounter: Payer: Self-pay | Admitting: Emergency Medicine

## 2023-06-24 ENCOUNTER — Inpatient Hospital Stay
Admission: EM | Admit: 2023-06-24 | Discharge: 2023-06-29 | DRG: 442 | Disposition: A | Discharge status: Skilled Nursing Facility | Payer: Medicare Other | Attending: Hospitalist | Admitting: Hospitalist

## 2023-06-24 ENCOUNTER — Emergency Department: Payer: Medicare Other

## 2023-06-24 ENCOUNTER — Other Ambulatory Visit: Payer: Self-pay

## 2023-06-24 DIAGNOSIS — S0081XA Abrasion of other part of head, initial encounter: Secondary | ICD-10-CM | POA: Diagnosis present

## 2023-06-24 DIAGNOSIS — K7682 Hepatic encephalopathy: Principal | ICD-10-CM | POA: Diagnosis present

## 2023-06-24 DIAGNOSIS — D649 Anemia, unspecified: Secondary | ICD-10-CM | POA: Diagnosis present

## 2023-06-24 DIAGNOSIS — N183 Chronic kidney disease, stage 3 unspecified: Secondary | ICD-10-CM | POA: Diagnosis not present

## 2023-06-24 DIAGNOSIS — E1169 Type 2 diabetes mellitus with other specified complication: Secondary | ICD-10-CM

## 2023-06-24 DIAGNOSIS — N184 Chronic kidney disease, stage 4 (severe): Secondary | ICD-10-CM | POA: Diagnosis present

## 2023-06-24 DIAGNOSIS — R188 Other ascites: Secondary | ICD-10-CM | POA: Diagnosis present

## 2023-06-24 DIAGNOSIS — M2041 Other hammer toe(s) (acquired), right foot: Secondary | ICD-10-CM | POA: Diagnosis present

## 2023-06-24 DIAGNOSIS — E66811 Obesity, class 1: Secondary | ICD-10-CM | POA: Diagnosis present

## 2023-06-24 DIAGNOSIS — L89620 Pressure ulcer of left heel, unstageable: Secondary | ICD-10-CM | POA: Diagnosis present

## 2023-06-24 DIAGNOSIS — W19XXXA Unspecified fall, initial encounter: Secondary | ICD-10-CM | POA: Diagnosis not present

## 2023-06-24 DIAGNOSIS — N1832 Chronic kidney disease, stage 3b: Secondary | ICD-10-CM | POA: Diagnosis present

## 2023-06-24 DIAGNOSIS — Y92009 Unspecified place in unspecified non-institutional (private) residence as the place of occurrence of the external cause: Secondary | ICD-10-CM

## 2023-06-24 DIAGNOSIS — E7220 Disorder of urea cycle metabolism, unspecified: Secondary | ICD-10-CM

## 2023-06-24 DIAGNOSIS — G9340 Encephalopathy, unspecified: Secondary | ICD-10-CM | POA: Diagnosis present

## 2023-06-24 DIAGNOSIS — K7581 Nonalcoholic steatohepatitis (NASH): Secondary | ICD-10-CM | POA: Diagnosis present

## 2023-06-24 DIAGNOSIS — I129 Hypertensive chronic kidney disease with stage 1 through stage 4 chronic kidney disease, or unspecified chronic kidney disease: Secondary | ICD-10-CM | POA: Diagnosis present

## 2023-06-24 DIAGNOSIS — G4733 Obstructive sleep apnea (adult) (pediatric): Secondary | ICD-10-CM | POA: Diagnosis present

## 2023-06-24 DIAGNOSIS — Z8051 Family history of malignant neoplasm of kidney: Secondary | ICD-10-CM

## 2023-06-24 DIAGNOSIS — W06XXXA Fall from bed, initial encounter: Secondary | ICD-10-CM | POA: Diagnosis present

## 2023-06-24 DIAGNOSIS — Z79899 Other long term (current) drug therapy: Secondary | ICD-10-CM

## 2023-06-24 DIAGNOSIS — S0031XA Abrasion of nose, initial encounter: Secondary | ICD-10-CM | POA: Diagnosis present

## 2023-06-24 DIAGNOSIS — Z833 Family history of diabetes mellitus: Secondary | ICD-10-CM

## 2023-06-24 DIAGNOSIS — D563 Thalassemia minor: Secondary | ICD-10-CM | POA: Diagnosis present

## 2023-06-24 DIAGNOSIS — E669 Obesity, unspecified: Secondary | ICD-10-CM | POA: Diagnosis present

## 2023-06-24 DIAGNOSIS — D631 Anemia in chronic kidney disease: Secondary | ICD-10-CM | POA: Diagnosis present

## 2023-06-24 DIAGNOSIS — Z6833 Body mass index (BMI) 33.0-33.9, adult: Secondary | ICD-10-CM

## 2023-06-24 DIAGNOSIS — E1140 Type 2 diabetes mellitus with diabetic neuropathy, unspecified: Secondary | ICD-10-CM | POA: Diagnosis present

## 2023-06-24 DIAGNOSIS — K643 Fourth degree hemorrhoids: Secondary | ICD-10-CM | POA: Diagnosis present

## 2023-06-24 DIAGNOSIS — Z90710 Acquired absence of both cervix and uterus: Secondary | ICD-10-CM

## 2023-06-24 DIAGNOSIS — T59891A Toxic effect of other specified gases, fumes and vapors, accidental (unintentional), initial encounter: Principal | ICD-10-CM

## 2023-06-24 DIAGNOSIS — Z91048 Other nonmedicinal substance allergy status: Secondary | ICD-10-CM

## 2023-06-24 DIAGNOSIS — R0781 Pleurodynia: Secondary | ICD-10-CM | POA: Diagnosis present

## 2023-06-24 DIAGNOSIS — Z807 Family history of other malignant neoplasms of lymphoid, hematopoietic and related tissues: Secondary | ICD-10-CM | POA: Diagnosis not present

## 2023-06-24 DIAGNOSIS — Z9109 Other allergy status, other than to drugs and biological substances: Secondary | ICD-10-CM

## 2023-06-24 DIAGNOSIS — E1122 Type 2 diabetes mellitus with diabetic chronic kidney disease: Secondary | ICD-10-CM | POA: Diagnosis present

## 2023-06-24 DIAGNOSIS — Z803 Family history of malignant neoplasm of breast: Secondary | ICD-10-CM | POA: Diagnosis not present

## 2023-06-24 DIAGNOSIS — D696 Thrombocytopenia, unspecified: Secondary | ICD-10-CM | POA: Diagnosis present

## 2023-06-24 DIAGNOSIS — Z87891 Personal history of nicotine dependence: Secondary | ICD-10-CM | POA: Diagnosis not present

## 2023-06-24 DIAGNOSIS — Z91148 Patient's other noncompliance with medication regimen for other reason: Secondary | ICD-10-CM | POA: Diagnosis not present

## 2023-06-24 DIAGNOSIS — Z8249 Family history of ischemic heart disease and other diseases of the circulatory system: Secondary | ICD-10-CM | POA: Diagnosis not present

## 2023-06-24 DIAGNOSIS — F419 Anxiety disorder, unspecified: Secondary | ICD-10-CM | POA: Diagnosis present

## 2023-06-24 DIAGNOSIS — K219 Gastro-esophageal reflux disease without esophagitis: Secondary | ICD-10-CM | POA: Diagnosis present

## 2023-06-24 DIAGNOSIS — Z91040 Latex allergy status: Secondary | ICD-10-CM

## 2023-06-24 DIAGNOSIS — Z91199 Patient's noncompliance with other medical treatment and regimen due to unspecified reason: Secondary | ICD-10-CM

## 2023-06-24 DIAGNOSIS — G9341 Metabolic encephalopathy: Secondary | ICD-10-CM | POA: Diagnosis not present

## 2023-06-24 DIAGNOSIS — Z8616 Personal history of COVID-19: Secondary | ICD-10-CM | POA: Diagnosis not present

## 2023-06-24 DIAGNOSIS — G928 Other toxic encephalopathy: Principal | ICD-10-CM

## 2023-06-24 DIAGNOSIS — K766 Portal hypertension: Secondary | ICD-10-CM | POA: Diagnosis present

## 2023-06-24 DIAGNOSIS — Z96611 Presence of right artificial shoulder joint: Secondary | ICD-10-CM | POA: Diagnosis present

## 2023-06-24 DIAGNOSIS — K7460 Unspecified cirrhosis of liver: Secondary | ICD-10-CM | POA: Diagnosis present

## 2023-06-24 LAB — CBC WITH DIFFERENTIAL/PLATELET
Abs Immature Granulocytes: 0.02 10*3/uL (ref 0.00–0.07)
Basophils Absolute: 0 10*3/uL (ref 0.0–0.1)
Basophils Relative: 1 %
Eosinophils Absolute: 0.4 10*3/uL (ref 0.0–0.5)
Eosinophils Relative: 10 %
HCT: 21.7 % — ABNORMAL LOW (ref 36.0–46.0)
Hemoglobin: 6.5 g/dL — ABNORMAL LOW (ref 12.0–15.0)
Immature Granulocytes: 1 %
Lymphocytes Relative: 30 %
Lymphs Abs: 1.3 10*3/uL (ref 0.7–4.0)
MCH: 22.3 pg — ABNORMAL LOW (ref 26.0–34.0)
MCHC: 30 g/dL (ref 30.0–36.0)
MCV: 74.3 fL — ABNORMAL LOW (ref 80.0–100.0)
Monocytes Absolute: 0.5 10*3/uL (ref 0.1–1.0)
Monocytes Relative: 12 %
Neutro Abs: 2.1 10*3/uL (ref 1.7–7.7)
Neutrophils Relative %: 46 %
Platelets: 128 10*3/uL — ABNORMAL LOW (ref 150–400)
RBC: 2.92 MIL/uL — ABNORMAL LOW (ref 3.87–5.11)
RDW: 22 % — ABNORMAL HIGH (ref 11.5–15.5)
Smear Review: NORMAL
WBC: 4.3 10*3/uL (ref 4.0–10.5)
nRBC: 0 % (ref 0.0–0.2)

## 2023-06-24 LAB — RETICULOCYTES
Immature Retic Fract: 7.8 % (ref 2.3–15.9)
RBC.: 2.95 MIL/uL — ABNORMAL LOW (ref 3.87–5.11)
Retic Count, Absolute: 262.8 10*3/uL — ABNORMAL HIGH (ref 19.0–186.0)
Retic Ct Pct: 8.9 % — ABNORMAL HIGH (ref 0.4–3.1)

## 2023-06-24 LAB — URINALYSIS, ROUTINE W REFLEX MICROSCOPIC
Bilirubin Urine: NEGATIVE
Glucose, UA: NEGATIVE mg/dL
Hgb urine dipstick: NEGATIVE
Ketones, ur: NEGATIVE mg/dL
Nitrite: NEGATIVE
Protein, ur: NEGATIVE mg/dL
Specific Gravity, Urine: 1.011 (ref 1.005–1.030)
pH: 5 (ref 5.0–8.0)

## 2023-06-24 LAB — COMPREHENSIVE METABOLIC PANEL
ALT: 20 U/L (ref 0–44)
AST: 36 U/L (ref 15–41)
Albumin: 2.8 g/dL — ABNORMAL LOW (ref 3.5–5.0)
Alkaline Phosphatase: 82 U/L (ref 38–126)
Anion gap: 7 (ref 5–15)
BUN: 37 mg/dL — ABNORMAL HIGH (ref 8–23)
CO2: 18 mmol/L — ABNORMAL LOW (ref 22–32)
Calcium: 8.4 mg/dL — ABNORMAL LOW (ref 8.9–10.3)
Chloride: 110 mmol/L (ref 98–111)
Creatinine, Ser: 1.39 mg/dL — ABNORMAL HIGH (ref 0.44–1.00)
GFR, Estimated: 42 mL/min — ABNORMAL LOW (ref >60)
Glucose, Bld: 186 mg/dL — ABNORMAL HIGH (ref 70–99)
Potassium: 3.3 mmol/L — ABNORMAL LOW (ref 3.5–5.1)
Sodium: 135 mmol/L (ref 135–145)
Total Bilirubin: 1.6 mg/dL — ABNORMAL HIGH (ref <1.2)
Total Protein: 5.6 g/dL — ABNORMAL LOW (ref 6.5–8.1)

## 2023-06-24 LAB — CBG MONITORING, ED: Glucose-Capillary: 107 mg/dL — ABNORMAL HIGH (ref 70–99)

## 2023-06-24 LAB — FERRITIN: Ferritin: 801 ng/mL — ABNORMAL HIGH (ref 11–307)

## 2023-06-24 LAB — PREPARE RBC (CROSSMATCH)

## 2023-06-24 LAB — HEMOGLOBIN AND HEMATOCRIT, BLOOD
HCT: 25.6 % — ABNORMAL LOW (ref 36.0–46.0)
Hemoglobin: 7.8 g/dL — ABNORMAL LOW (ref 12.0–15.0)

## 2023-06-24 LAB — AMMONIA: Ammonia: 92 umol/L — ABNORMAL HIGH (ref 9–35)

## 2023-06-24 MED ORDER — INSULIN ASPART 100 UNIT/ML IJ SOLN
0.0000 [IU] | Freq: Three times a day (TID) | INTRAMUSCULAR | Status: DC
  Administered 2023-06-25: 2 [IU] via SUBCUTANEOUS
  Administered 2023-06-26 (×2): 1 [IU] via SUBCUTANEOUS
  Filled 2023-06-24 (×3): qty 1

## 2023-06-24 MED ORDER — LACTULOSE 10 GM/15ML PO SOLN
45.0000 g | Freq: Three times a day (TID) | ORAL | Status: DC
Start: 2023-06-24 — End: 2023-06-28
  Administered 2023-06-24 – 2023-06-27 (×7): 45 g via ORAL
  Filled 2023-06-24 (×10): qty 90

## 2023-06-24 MED ORDER — RIFAXIMIN 550 MG PO TABS
550.0000 mg | ORAL_TABLET | Freq: Two times a day (BID) | ORAL | Status: DC
Start: 2023-06-24 — End: 2023-06-29
  Administered 2023-06-24 – 2023-06-29 (×10): 550 mg via ORAL
  Filled 2023-06-24 (×12): qty 1

## 2023-06-24 MED ORDER — ONDANSETRON HCL 4 MG PO TABS: 4.0000 mg | ORAL_TABLET | Freq: Four times a day (QID) | ORAL | Status: DC | PRN

## 2023-06-24 MED ORDER — SODIUM CHLORIDE 0.9% IV SOLUTION
Freq: Once | INTRAVENOUS | Status: AC
  Filled 2023-06-24: qty 250

## 2023-06-24 MED ORDER — ONDANSETRON HCL 4 MG/2ML IJ SOLN
4.0000 mg | Freq: Four times a day (QID) | INTRAMUSCULAR | Status: DC | PRN
  Administered 2023-06-25 – 2023-06-27 (×2): 4 mg via INTRAVENOUS
  Filled 2023-06-24 (×2): qty 2

## 2023-06-24 NOTE — ED Notes (Signed)
Pt. Sleeping in stretcher, chest rise and fall. Pt. Arouses to light touch, or voice, but very sleepy and falls back asleep quickly. Pt. Oriented when awake.

## 2023-06-24 NOTE — ED Notes (Signed)
Pt. Assisted up to toilet via wheelchair. Pt. Unsteady on her feet. Pt's shoes on. X1 Assist, pt. Able to transfer to toilet and back to wheelchair, and back to bed from wheelchair. Pt. Is alert and oriented and conversational. Unable to get urine sample at this time, pt. Missed hat in toilet.

## 2023-06-24 NOTE — Assessment & Plan Note (Signed)
Plt count in 120s  Stable

## 2023-06-24 NOTE — Assessment & Plan Note (Signed)
Positive fall getting out of bed today in the setting of decompensated encephalopathy Positive facial abrasion as well as fall on the left ribs without reported head trauma or loss of consciousness. No acute trauma noted on CT imaging today though with chronic findings as noted Restart home lactulose and rifaximin PT OT evaluation Fall precautions Monitor

## 2023-06-24 NOTE — Assessment & Plan Note (Signed)
Creatinine 1.39 with GFR in the 40s Monitor

## 2023-06-24 NOTE — ED Notes (Signed)
Pt. Sleeping in stretcher, chest rise and fall. NAD.

## 2023-06-24 NOTE — ED Notes (Signed)
Pt. Placed on cont. Cardiac monitoring. Shoes removed and placed in pt. Belonging bag. Grip socks applied to pt. Fall risk band applied. Bed alarm on and functioning. Pt. Verbalizes understanding to call if she needs to use the bathroom.

## 2023-06-24 NOTE — Assessment & Plan Note (Signed)
Worsening confusion with uptrending ammonia level in the setting of baseline NASH cirrhosis Missed dosing of lactulose today Ammonia level 56-->92  Will resume lactulose as well as continue home rifaximin Monitor

## 2023-06-24 NOTE — ED Notes (Signed)
Pt. Is very drowsy, but oriented when she is woken up. Pt. Falls back asleep quickly, but arouses  to voice or light touch.

## 2023-06-24 NOTE — ED Notes (Signed)
Bedside commode to bedside. Call light and personal items within reach. Pt. Verbalizes understanding to call if she needs to use toilet. Bed locked and low.

## 2023-06-24 NOTE — H&P (Addendum)
History and Physical    Patient: Rachel Huber:[id number] DOB: 1956/04/15 DOA: 06/24/2023 DOS: the patient was seen and examined on 06/24/2023 PCP: Lauro Regulus, MD  Patient coming from: Home  Chief Complaint:  Chief Complaint  Patient presents with   Fall   HPI: MALAYSIA CRANCE is a 66 y.o. 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  Per report, patient fell out of bed this morning.  No reported head trauma loss conscious.  Did have facial abrasion as well as landing on the left rib area.  Noted baseline chronic encephalopathy in the setting of [place] cirrhosis.  Missed dose of lactulose today.  No fevers or chills.  No nausea or vomiting.  Mild weakness.  No reported active bleeding.  Does report getting intermittent transfusions at [facility].  Has had presented to the ER afebrile, blood pressures 90s to 110s over 50s to 70s.  Satting well on room air.  Left-sided rib pain after the fall.  White count 4.3, hemoglobin 6.5 with a baseline hemoglobin around 8.  Platelets 128.  Creatinine 1.4, glucose 186.  AST and ALT within normal limits.  Plain films of the rib grossly stable.  CT of the chest abdomen pelvis with no acute traumatic injury noted.  Positive chronic changes including: pleural effusion, cirrhotic liver status post TIPS, splenic infarctions.  Cholelithiasis without cholecystitis.  Positive ascites.  Review of Systems: As mentioned in the history of present illness. All other systems reviewed and are negative. Past Medical History:  Diagnosis Date   Anemia in chronic kidney  disease (CKD) 02/07/2022   Anxiety    Arthritis    Ascites of liver    Weekly paracentesis   Cirrhosis of liver (HCC)    CKD (chronic kidney disease) stage 4, GFR 15-29 ml/min (HCC)    GERD (gastroesophageal reflux disease)    Grade IV internal hemorrhoids    Hypertension    NASH (nonalcoholic steatohepatitis)    Neuropathy, diabetic (HCC)    Obesity (BMI 30-39.9)    OSA on CPAP    Pneumonia    Portal hypertension (HCC)    S/P TIPS (transjugular intrahepatic portosystemic shunt)    Thalassemia minor 1992   Past Surgical History:  Procedure Laterality Date   ABDOMINAL HYSTERECTOMY     BREAST BIOPSY Right 02/15/2018   [place] bx 6-6:30 ribbon shape, ONE CORE FRAGMENT WITH FIBROSIS. ONE CORE FRAGMENT WITH PORTION OF A DILATED   BREAST BIOPSY Right 02/15/2018   [place] bx 9:00 heart shape, USUAL DUCTAL HYPERPLASIA   BREAST LUMPECTOMY Right 03/09/2018   Procedure: BREAST LUMPECTOMY x 2;  Surgeon: Sung Amabile, DO;  Location: [facility] ORS;  Service: General;  Laterality: Right;   CATARACT EXTRACTION W/PHACO Left 06/11/2016   Procedure: CATARACT EXTRACTION PHACO AND INTRAOCULAR LENS PLACEMENT (IOC);  Surgeon: Sallee Lange, MD;  Location: [facility] ORS;  Service: Ophthalmology;  Laterality: Left;  Lot # [id number] H US:01:38.6 AP%:26.4 CDE:44.15   CATARACT EXTRACTION W/PHACO Right 06/15/2018   Procedure: CATARACT EXTRACTION PHACO AND INTRAOCULAR LENS PLACEMENT (IOC);  Surgeon: Galen Manila, MD;  Location: [facility] ORS;  Service: Ophthalmology;  Laterality: Right;  [place] 00:38.2 CDE 4.23 Fluid Pack Lot #  [id number]   COLONOSCOPIES     COLONOSCOPY WITH PROPOFOL N/A 10/10/2020   Procedure: COLONOSCOPY WITH PROPOFOL;  Surgeon: Regis Bill, MD;  Location: [facility] ENDOSCOPY;  Service: Endoscopy;  Laterality: N/A;   COLONOSCOPY WITH PROPOFOL N/A 11/20/2020   Procedure: COLONOSCOPY WITH PROPOFOL;  Surgeon: Regis Bill, MD;  Location: [facility] ENDOSCOPY;  Service: Endoscopy;  Laterality: N/A;  DM STAT CBC,  BMP COVID POSITIVE 09/02/2020   COLONOSCOPY WITH PROPOFOL N/A 06/19/2022   Procedure: COLONOSCOPY WITH PROPOFOL;  Surgeon: Regis Bill, MD;  Location: [facility] ENDOSCOPY;  Service: Endoscopy;  Laterality: N/A;   CTR     ESOPHAGOGASTRODUODENOSCOPY (EGD) WITH PROPOFOL N/A 10/10/2020   Procedure: ESOPHAGOGASTRODUODENOSCOPY (EGD) WITH PROPOFOL;  Surgeon: Regis Bill, MD;  Location: [facility] ENDOSCOPY;  Service: Endoscopy;  Laterality: N/A;  COVID POSITIVE 10/08/2020   FLEXIBLE SIGMOIDOSCOPY N/A 02/06/2022   Procedure: FLEXIBLE SIGMOIDOSCOPY;  Surgeon: Regis Bill, MD;  Location: [facility] ENDOSCOPY;  Service: Endoscopy;  Laterality: N/A;  Patient requests anesthesia   FLEXIBLE SIGMOIDOSCOPY N/A 12/19/2022   Procedure: FLEXIBLE SIGMOIDOSCOPY;  Surgeon: Dolores Frame, MD;  Location: [facility];  Service: Gastroenterology;  Laterality: N/A;   IR ANGIOGRAM SELECTIVE EACH ADDITIONAL VESSEL  02/07/2022   IR ANGIOGRAM SELECTIVE EACH ADDITIONAL VESSEL  02/07/2022   IR ANGIOGRAM SELECTIVE EACH ADDITIONAL VESSEL  09/05/2022   IR ANGIOGRAM VISCERAL SELECTIVE  02/07/2022   IR ANGIOGRAM VISCERAL SELECTIVE  09/05/2022   IR CV LINE INJECTION  02/24/2023   IR EMBO ART  VEN HEMORR LYMPH EXTRAV  INC GUIDE ROADMAPPING  09/05/2022   IR EMBO ART  VEN HEMORR LYMPH EXTRAV  INC GUIDE ROADMAPPING  11/05/2022   IR EMBO ARTERIAL NOT HEMORR HEMANG INC GUIDE ROADMAPPING  02/07/2022   IR EMBO TUMOR ORGAN ISCHEMIA INFARCT INC GUIDE ROADMAPPING  01/06/2023   IR IMAGING GUIDED PORT INSERTION  06/13/2022   IR IMAGING GUIDED PORT INSERTION  11/25/2022   IR PARACENTESIS  12/17/2021   IR PARACENTESIS  03/25/2022   IR PARACENTESIS  11/25/2022   IR PARACENTESIS  01/06/2023   IR PARACENTESIS  01/19/2023   IR RADIOLOGIST EVAL & MGMT  03/18/2022   IR RADIOLOGIST EVAL & MGMT  05/07/2022   IR RADIOLOGIST EVAL & MGMT  05/16/2022   IR RADIOLOGIST EVAL & MGMT  10/13/2022   IR RADIOLOGIST EVAL & MGMT  12/12/2022   IR RADIOLOGIST EVAL & MGMT   03/30/2023   IR US GUIDE VASC ACCESS RIGHT  02/07/2022   IR US GUIDE VASC ACCESS RIGHT  09/05/2022   JOINT REPLACEMENT     KNEE SURGERY Right 09/24/2017   plates and pins   PORT-A-CATH REMOVAL N/A 09/23/2022   Procedure: MINOR REMOVAL PORT-A-CATH;  Surgeon: Franky Macho, MD;  Location: [facility];  Service: General;  Laterality: N/A;   TEE WITHOUT CARDIOVERSION N/A 09/23/2022   Procedure: TRANSESOPHAGEAL ECHOCARDIOGRAM (TEE);  Surgeon: Antoine Poche, MD;  Location: [facility];  Service: Endoscopy;  Laterality: N/A;   TOTAL SHOULDER ARTHROPLASTY Right 09/27/2015   Social History:  reports that she has quit smoking. Her smoking use included cigarettes. She has never used smokeless tobacco. She reports that she does not drink alcohol and does not use drugs.  Allergies  Allergen Reactions   Latex Rash    Contact rash   Tape Other (See Comments)    Skin sensitivity   Gramineae Pollens Other (See Comments)    Sneezing, Running nose  Other Reaction(s): Other (see comments)    Family History  Problem Relation Age of Onset   Breast cancer Mother 63   Lymphoma Mother    Diabetes Father    Kidney cancer Father    Heart disease Father    Diabetes Sister    Breast cancer Sister 26    Prior to Admission medications   Medication Sig Start Date End Date Taking? Authorizing Provider  acetaminophen (TYLENOL) 500 MG tablet Take 1 tablet (500 mg total) by mouth 2 (two) times daily as needed for mild pain. 01/26/23   Angiulli, Mcarthur Rossetti, PA-C  colchicine 0.6 MG tablet Take 1 tablet (0.6 mg total) by mouth 2 (two) times daily. 05/22/23   Sharee Holster, NP  cyanocobalamin (VITAMIN B12) 500 MCG tablet Take 1,000 mcg by mouth daily. 06/28/21   [provider]  folic acid (FOLVITE) 1 MG tablet Take 1 tablet (1 mg total) by mouth daily. 05/25/23   Doreatha Massed, MD  lactulose (CHRONULAC) 10 GM/15ML solution Take 45 mLs (30 g total) by mouth 3 (three) times daily. 05/22/23   Sharee Holster, NP   Multiple Vitamins-Minerals (MULTIVITAMIN WITH MINERALS) tablet Take 1 tablet by mouth daily.    [provider]  sertraline (ZOLOFT) 50 MG tablet Take 1 tablet (50 mg total) by mouth daily. 05/22/23   Sharee Holster, NP  torsemide (DEMADEX) 20 MG tablet Take 2 tablets (40 mg total) by mouth daily. 05/22/23   Sharee Holster, NP  Vitamin D, Ergocalciferol, (DRISDOL) 1.25 MG (50000 UNIT) CAPS capsule Take 1 capsule (50,000 Units total) by mouth once a week. 05/22/23   Sharee Holster, NP  XIFAXAN 550 MG TABS tablet Take 1 tablet (550 mg total) by mouth 2 (two) times daily. 05/22/23   Sharee Holster, NP    Physical Exam: Vitals:   06/24/23 1530 06/24/23 1600 06/24/23 1604 06/24/23 1620  BP: (!) 115/58 (!) 114/51 115/73   Pulse: 67 70 68 63  Resp: 12     Temp:   97.8 F (36.6 C)   TempSrc:   Oral   SpO2: 96% 97% 94% 93%  Weight:      Height:       Physical Exam Constitutional:      Appearance: She is obese.     Comments: Mild generalized lethargy    HENT:     Head: Normocephalic and atraumatic.     Mouth/Throat:     Mouth: Mucous membranes are moist.  Eyes:     Pupils: Pupils are equal, round, and reactive to light.  Cardiovascular:     Rate and Rhythm: Normal rate and regular rhythm.  Pulmonary:     Effort: Pulmonary effort is normal.  Abdominal:     General: Bowel sounds are normal.  Musculoskeletal:     Comments: + L sided rib pain near L breast   Skin:    General: Skin is warm.  Neurological:     Comments: + generalized weakness  Minimal to mild generalized confusio    Psychiatric:        Mood and Affect: Mood normal.     Data Reviewed:  There are no new results to review at this time.  CT CHEST ABDOMEN PELVIS WO CONTRAST CLINICAL DATA:  Polytrauma, blunt  EXAM: CT CHEST, ABDOMEN AND PELVIS WITHOUT CONTRAST  TECHNIQUE: Multidetector CT imaging of the chest, abdomen and pelvis was performed following the standard protocol without IV  contrast.  RADIATION DOSE REDUCTION: This exam was performed according to the departmental dose-optimization program which includes  automated exposure control, adjustment of the mA and/or kV according to patient size and/or use of iterative reconstruction technique.  COMPARISON:  CT scan abdomen and pelvis from 03/18/2023.  FINDINGS: CT CHEST FINDINGS  Cardiovascular: Normal cardiac size. No pericardial effusion. No aortic aneurysm. There are coronary artery calcifications, in keeping with coronary artery disease. Mild mitral annulus calcifications noted. There is apparent hypoattenuation of the blood pool relative to the myocardium, suggestive of anemia.  Mediastinum/Nodes: Visualized thyroid gland appears grossly unremarkable. No solid / cystic mediastinal masses. The esophagus is nondistended precluding optimal assessment. No axillary, mediastinal or hilar lymphadenopathy by size criteria.  Lungs/Pleura: The central tracheo-bronchial tree is patent. There is small left pleural effusion with associated compressive atelectatic changes in the left lung lower lobe. There are patchy areas of linear, plate-like atelectasis and/or scarring throughout bilateral lungs. No mass or consolidation. No right pleural effusion. No suspicious lung nodules.  Musculoskeletal: A CT Port-a-Cath is seen in the right upper chest wall with the catheter terminating in the cavo-atrial junction region. Visualized soft tissues of the chest wall are otherwise grossly unremarkable. No suspicious osseous lesions. There are mild multilevel degenerative changes in the visualized spine.  CT ABDOMEN PELVIS FINDINGS  Hepatobiliary: The liver is normal in size. Cirrhotic configuration. There is a TIPSS shunt. No suspicious mass. No intrahepatic or extrahepatic bile duct dilation. Partially distended gallbladder nearly completely filled with hyperattenuating sludge and calculi without imaging signs of acute  cholecystitis. Normal gallbladder wall thickness. No pericholecystic inflammatory changes.  Pancreas: Unremarkable. No pancreatic ductal dilatation or surrounding inflammatory changes.  Spleen: Redemonstration of mildly enlarged spleen with peripheral hypoattenuating areas/splenic infarct along the inferolateral aspect, similar to the prior study.  Adrenals/Urinary Tract: Adrenal glands are unremarkable. No suspicious renal mass. No hydronephrosis. There are at least 2, sub 4 mm nonobstructing calculi in the left kidney and at least 1, 2 mm nonobstructing calculus in the right kidney. No ureteric calculi. Unremarkable urinary bladder.  Stomach/Bowel: No disproportionate dilation of the small or large bowel loops. No evidence of abnormal bowel wall thickening or inflammatory changes. The appendix was not visualized; however there is no acute inflammatory process in the right lower quadrant.  Vascular/Lymphatic: There is moderate ascites. No pneumoperitoneum. No abdominal or pelvic lymphadenopathy, by size criteria. No aneurysmal dilation of the major abdominal arteries. There are mild peripheral atherosclerotic vascular calcifications of the aorta and its major branches.  Reproductive: The uterus is surgically absent. No large adnexal mass.  Other: There is a small ascitic fluid containing umbilical hernia. The soft tissues and abdominal wall are otherwise unremarkable.  Musculoskeletal: No suspicious osseous lesions. There are mild - moderate multilevel degenerative changes in the visualized spine.  IMPRESSION: *No traumatic injury to the chest, abdomen or pelvis. *Multiple other nonacute observations (such as left pleural effusion, anemia, cirrhotic liver, TIPSS, splenic infarctions, cholelithiasis without acute cholecystitis, moderate ascites, bilateral nonobstructing renal calculi, etc.), as described above.  Electronically Signed   By: Jules Schick M.D.   On:  06/24/2023 15:10 DG Ribs Unilateral W/Chest Left CLINICAL DATA:  Left rib pain after fall.  EXAM: LEFT RIBS AND CHEST - 3+ VIEW  COMPARISON:  April 29, 2023.  FINDINGS: No fracture or other bone lesions are seen involving the ribs. There is no evidence of pneumothorax. Mild left basilar atelectasis is noted with small left pleural effusion. Heart size and mediastinal contours are within normal limits.  IMPRESSION: No definite abnormality seen involving the left ribs. Mild left basilar atelectasis with  small left pleural effusion.  Electronically Signed   By: Lupita Raider M.D.   On: 06/24/2023 13:29  Lab Results  Component Value Date   WBC 4.3 06/24/2023   HGB 6.5 (L) 06/24/2023   HCT 21.7 (L) 06/24/2023   MCV 74.3 (L) 06/24/2023   PLT 128 (L) 06/24/2023   Last metabolic panel Lab Results  Component Value Date   GLUCOSE 186 (H) 06/24/2023   NA 135 06/24/2023   K 3.3 (L) 06/24/2023   CL 110 06/24/2023   CO2 18 (L) 06/24/2023   BUN 37 (H) 06/24/2023   CREATININE 1.39 (H) 06/24/2023   GFRNONAA 42 (L) 06/24/2023   CALCIUM 8.4 (L) 06/24/2023   PHOS 3.3 12/20/2022   PROT 5.6 (L) 06/24/2023   ALBUMIN 2.8 (L) 06/24/2023   LABGLOB 2.0 (L) 01/06/2023   AGRATIO 1.3 01/06/2023   BILITOT 1.6 (H) 06/24/2023   ALKPHOS 82 06/24/2023   AST 36 06/24/2023   ALT 20 06/24/2023   ANIONGAP 7 06/24/2023    Assessment and Plan: Acute hepatic encephalopathy (HCC) Worsening confusion with uptrending ammonia level in the setting of baseline NASH cirrhosis Missed dosing of lactulose today Ammonia level 56-->92  Will resume lactulose as well as continue home rifaximin Monitor    Anemia Acute on chronic anemia with intermittent transfusion dependence Hemoglobin 6.5 today Will transfuse 1 unit PRBCs Noted history of rectal bleeding status post superior rectal artery embolization x 2 on 02/07/22 and 11/05/22, with recurrent grade 4 internal hemorrhoids and coil embolization of  the splenic artery 01/06/2023  No reported active bleeding Monitor closely  Liver cirrhosis secondary to NASH (HCC) Continue home torsemide and spironolactone  Fall at home, initial encounter Positive fall getting out of bed today in the setting of decompensated encephalopathy Positive facial abrasion as well as fall on the left ribs without reported head trauma or loss of consciousness. No acute trauma noted on CT imaging today though with chronic findings as noted Restart home lactulose and rifaximin PT OT evaluation Fall precautions Monitor  Thrombocytopenia (HCC) Plt count in 120s  Stable   Chronic kidney disease, stage 3b (HCC) Creatinine 1.39 with GFR in the 40s Monitor      Advance Care Planning:   Code Status: Full Code   Consults: None at present   Family Communication: No family at the bedside   Severity of Illness: The appropriate patient status for this patient is INPATIENT. Inpatient status is judged to be reasonable and necessary in order to provide the required intensity of service to ensure the patient's safety. The patient's presenting symptoms, physical exam findings, and initial radiographic and laboratory data in the context of their chronic comorbidities is felt to place them at high risk for further clinical deterioration. Furthermore, it is not anticipated that the patient will be medically stable for discharge from the hospital within 2 midnights of admission.   * I certify that at the point of admission it is my clinical judgment that the patient will require inpatient hospital care spanning beyond 2 midnights from the point of admission due to high intensity of service, high risk for further deterioration and high frequency of surveillance required.*  Author: Floydene Flock, MD 06/24/2023 4:48 PM  For on call review [email or web address].

## 2023-06-24 NOTE — ED Triage Notes (Signed)
Pt fell getting out of bed this morning. Pt states she was too close to the edge of the bed and fell. Hit nose on side table and has pain on left ribs. No LOC and No blood thinners.

## 2023-06-24 NOTE — Assessment & Plan Note (Addendum)
Acute on chronic anemia with intermittent transfusion dependence Hemoglobin 6.5 today Will transfuse 1 unit PRBCs Noted history of rectal bleeding status post superior rectal artery embolization x 2 on 02/07/22 and 11/05/22, with recurrent grade 4 internal hemorrhoids and coil embolization of the splenic artery 01/06/2023  No reported active bleeding Monitor closely

## 2023-06-24 NOTE — Assessment & Plan Note (Signed)
-  Continue home torsemide and spironolactone

## 2023-06-24 NOTE — ED Notes (Signed)
Pt in rad 

## 2023-06-24 NOTE — ED Notes (Signed)
Dr. Alvester Morin at bedside. Pt. Alert and oriented. NAD.

## 2023-06-24 NOTE — ED Provider Notes (Signed)
[facility] Provider Note    Event Date/Time   First MD Initiated Contact with Patient 06/24/23 1152     (approximate)   History   Fall   HPI  Rachel Huber is a 67 y.o. female with history of Rachel Huber, hypertension, CKD, thalassemia minor and ascites presents emergency department after a fall.  Patient fell out of the bed hit her nose on the nightstand and fell hurting the left ribs and upper abdomen.  Patient states no bruising this time.  She has not had a recent paracentesis.  Did have a embolization of the spleen to help with the fluid collection in her abdomen.  Is followed by a liver specialist.  Has had a recent blood transfusion.  States doctor told her ferritin was high and they would be addressing that in office.      Physical Exam   Triage Vital Signs: ED Triage Vitals  Encounter Vitals Group     BP 06/24/23 1114 (!) 116/46     Systolic BP Percentile --      Diastolic BP Percentile --      Pulse Rate 06/24/23 1114 74     Resp 06/24/23 1114 18     Temp 06/24/23 1114 98 F (36.7 C)     Temp src --      SpO2 06/24/23 1114 100 %     Weight 06/24/23 1115 225 lb (102.1 kg)     Height 06/24/23 1115 5\' 9"  (1.753 m)     Head Circumference --      Peak Flow --      Pain Score 06/24/23 1114 6     Pain Loc --      Pain Education --      Exclude from Growth Chart --     Most recent vital signs: Vitals:   06/24/23 1604 06/24/23 1620  BP:    Pulse:  63  Resp:    Temp: 97.8 F (36.6 C)   SpO2:  93%     General: Awake, no distress.   CV:  Good peripheral perfusion. regular rate and  rhythm Resp:  Normal effort. Lungs cta Abd:  Distended, no bruising, tender in the left upper quadrant and left ribs Other:      ED Results / Procedures / Treatments   Labs (all labs ordered are listed, but only abnormal results are displayed) Labs Reviewed  COMPREHENSIVE METABOLIC PANEL - Abnormal; Notable for the following components:      Result  Value   Potassium 3.3 (*)    CO2 18 (*)    Glucose, Bld 186 (*)    BUN 37 (*)    Creatinine, Ser 1.39 (*)    Calcium 8.4 (*)    Total Protein 5.6 (*)    Albumin 2.8 (*)    Total Bilirubin 1.6 (*)    GFR, Estimated 42 (*)    All other components within normal limits  CBC WITH DIFFERENTIAL/PLATELET - Abnormal; Notable for the following components:   RBC 2.92 (*)    Hemoglobin 6.5 (*)    HCT 21.7 (*)    MCV 74.3 (*)    MCH 22.3 (*)    RDW 22.0 (*)    Platelets 128 (*)    All other components within normal limits  FERRITIN - Abnormal; Notable for the following components:   Ferritin 801 (*)    All other components within normal limits  AMMONIA - Abnormal; Notable for the following components:  Ammonia 92 (*)    All other components within normal limits  RETICULOCYTES - Abnormal; Notable for the following components:   Retic Ct Pct 8.9 (*)    RBC. 2.95 (*)    Retic Count, Absolute 262.8 (*)    All other components within normal limits  URINALYSIS, ROUTINE W REFLEX MICROSCOPIC     EKG     RADIOLOGY Left ribs with chest CT chest abdomen pelvis for trauma    PROCEDURES:   Procedures   MEDICATIONS ORDERED IN ED: Medications  rifaximin (XIFAXAN) tablet 550 mg (has no administration in time range)  lactulose (CHRONULAC) 10 GM/15ML solution 45 g (has no administration in time range)     IMPRESSION / MDM / ASSESSMENT AND PLAN / ED COURSE  I reviewed the triage vital signs and the nursing notes.                              Differential diagnosis includes, but is not limited to, rib fracture, splenic laceration, ascites, nasal bone contusion/abrasion  Patient's presentation is most consistent with acute illness / injury with system symptoms.   X-ray of the left ribs and chest ordered from triage  Due to the patient's health status along with pain in the left upper quadrant we will do CT chest abdomen pelvis for trauma.  Will not do contrast due to her chronic  kidney disease.  Labs ordered  Labs indicating chronic anemia which is in the patient's normal trend, patient's ammonia is elevated at 92.  Patient's comprehensive metabolic panel continues to exhibit decreased GFR, bilirubin  CT chest abdomen pelvis independently reviewed interpreted by me as being negative for any acute traumatic abnormality.  Chronic findings noted by the radiologist  Patient asked little more confused, more drowsy and is little unsteady on her feet.  Has not had her lactulose today but her ammonia level is elevated.  Her husband states she has been acting a little abnormal over the last few days.  Staking her lactulose as prescribed.  She takes 45 mL 3 times daily.  In shared decision making with the patient and her husband myself I do feel be best for her to be admitted to try and decrease the ammonia level at least overnight.  She is in agreement with the treatment plan.  Consult to hospitalist at this time  Spoke with Dr. Alvester Morin.  Will be admitting the patient for encephalopathy due to ammonia level.    FINAL CLINICAL IMPRESSION(S) / ED DIAGNOSES   Final diagnoses:  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     Rx / DC Orders   ED Discharge Orders     None        Note:  This document was prepared using Dragon voice recognition software and may include unintentional dictation errors.    Faythe Ghee, PA-C 06/24/23 1623    Pilar Jarvis, MD 06/25/23 [phone]

## 2023-06-25 ENCOUNTER — Encounter: Payer: Self-pay | Admitting: Family Medicine

## 2023-06-25 DIAGNOSIS — G9340 Encephalopathy, unspecified: Secondary | ICD-10-CM | POA: Diagnosis not present

## 2023-06-25 LAB — CBC
HCT: 25.1 % — ABNORMAL LOW (ref 36.0–46.0)
Hemoglobin: 7.7 g/dL — ABNORMAL LOW (ref 12.0–15.0)
MCH: 22.5 pg — ABNORMAL LOW (ref 26.0–34.0)
MCHC: 30.7 g/dL (ref 30.0–36.0)
MCV: 73.4 fL — ABNORMAL LOW (ref 80.0–100.0)
Platelets: 133 10*3/uL — ABNORMAL LOW (ref 150–400)
RBC: 3.42 MIL/uL — ABNORMAL LOW (ref 3.87–5.11)
RDW: 21.2 % — ABNORMAL HIGH (ref 11.5–15.5)
WBC: 4.2 10*3/uL (ref 4.0–10.5)
nRBC: 0 % (ref 0.0–0.2)

## 2023-06-25 LAB — CBG MONITORING, ED
Glucose-Capillary: 81 mg/dL (ref 70–99)
Glucose-Capillary: 160 mg/dL — ABNORMAL HIGH (ref 70–99)

## 2023-06-25 LAB — COMPREHENSIVE METABOLIC PANEL
ALT: 19 U/L (ref 0–44)
AST: 36 U/L (ref 15–41)
Albumin: 2.8 g/dL — ABNORMAL LOW (ref 3.5–5.0)
Alkaline Phosphatase: 87 U/L (ref 38–126)
Anion gap: 6 (ref 5–15)
BUN: 34 mg/dL — ABNORMAL HIGH (ref 8–23)
CO2: 20 mmol/L — ABNORMAL LOW (ref 22–32)
Calcium: 8.7 mg/dL — ABNORMAL LOW (ref 8.9–10.3)
Chloride: 114 mmol/L — ABNORMAL HIGH (ref 98–111)
Creatinine, Ser: 1.31 mg/dL — ABNORMAL HIGH (ref 0.44–1.00)
GFR, Estimated: 45 mL/min — ABNORMAL LOW (ref >60)
Glucose, Bld: 91 mg/dL (ref 70–99)
Potassium: 3.6 mmol/L (ref 3.5–5.1)
Sodium: 140 mmol/L (ref 135–145)
Total Bilirubin: 2.2 mg/dL — ABNORMAL HIGH (ref <1.2)
Total Protein: 5.9 g/dL — ABNORMAL LOW (ref 6.5–8.1)

## 2023-06-25 LAB — GLUCOSE, CAPILLARY: Glucose-Capillary: 119 mg/dL — ABNORMAL HIGH (ref 70–99)

## 2023-06-25 LAB — AMMONIA: Ammonia: 98 umol/L — ABNORMAL HIGH (ref 9–35)

## 2023-06-25 MED ORDER — ENOXAPARIN SODIUM 60 MG/0.6ML IJ SOSY
0.5000 mg/kg | PREFILLED_SYRINGE | INTRAMUSCULAR | Status: DC
  Administered 2023-06-25 – 2023-06-28 (×4): 50 mg via SUBCUTANEOUS
  Filled 2023-06-25 (×4): qty 0.6

## 2023-06-25 MED ORDER — CHLORHEXIDINE GLUCONATE CLOTH 2 % EX PADS
6.0000 | MEDICATED_PAD | Freq: Every day | CUTANEOUS | Status: DC
  Administered 2023-06-25 – 2023-06-29 (×5): 6 via TOPICAL

## 2023-06-25 NOTE — Consult Note (Signed)
PHARMACIST - PHYSICIAN COMMUNICATION  CONCERNING:  Enoxaparin (Lovenox) for DVT Prophylaxis   ASSESSMENT: Patient was prescribed enoxaparin 40 mg subcutaneously every 24 hours for VTE prophylaxis.   Body mass index is 33.23 kg/m.  Estimated Creatinine Clearance: 54.5 mL/min (A) (by C-G formula based on SCr of 1.31 mg/dL (H)).  Based on [facility] policy, patient qualifies for enoxaparin dosing of 0.5 mg per kilogram of total body weight every 24 hours because their body mass index is >30 kg/m2.  PLAN: Pharmacy has adjusted enoxaparin dose per [facility] policy.  Description: Patient is now receiving enoxaparin 0.5 mg/kg subcutaneously every 24 hours.  Will M. Dareen Piano, PharmD Clinical Pharmacist 06/25/2023 6:24 PM

## 2023-06-25 NOTE — ED Notes (Signed)
Informed RN bed assigned 

## 2023-06-25 NOTE — ED Notes (Signed)
Pharmacy messaged due to no supply of lactulose in either pyxsis

## 2023-06-25 NOTE — Progress Notes (Signed)
  PROGRESS NOTE    Rachel Huber  ZOX:[id number] DOB: 24-Nov-1955 DOA: 06/24/2023 PCP: Lauro Regulus, MD  106A/106A-AA  LOS: 1 day   Brief hospital course:   Assessment & Plan: Rachel Huber is a 67 y.o. 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  Per report, patient fell out of bed this morning.  No reported head trauma loss conscious.  Did have facial abrasion as well as landing on the left rib area.  Noted baseline chronic encephalopathy in the setting of Nash cirrhosis.  Missed dose of lactulose today.  No fevers or chills.  No nausea or vomiting.  Mild weakness.  No reported active bleeding.  Does report getting intermittent transfusions at [facility].    Acute hepatic encephalopathy (HCC) Worsening confusion with uptrending ammonia level in the setting of baseline NASH cirrhosis Missed dosing of lactulose on the day of presentation Ammonia level 56-->92  Plan: --cont lactulose and rifaximin   Chronic Anemia --with intermittent transfusion dependence.  No reported active bleeding currently. --s/p 1u pRBC for Hemoglobin 6.5 --monitor Hgb and transfuse to keep Hgb >7  Liver cirrhosis secondary to NASH (HCC) Continue home torsemide and spironolactone   Fall at home, initial encounter Positive fall getting out of bed today in the setting of decompensated encephalopathy Positive facial abrasion as well as fall on the left ribs without reported head trauma or loss of consciousness. No acute trauma noted on CT imaging  --fall precautions  Thrombocytopenia (HCC) Plt count in  120s  Stable   Chronic kidney disease, stage 3b (HCC) Creatinine 1.39 with GFR in the 40s  DM2 --A1c 6.8 about 3 months ago. --ACHS and SSI   DVT prophylaxis: Lovenox SQ Code Status: Full code  Family Communication:  Level of care: Med-Surg Dispo:   The patient is from: home Anticipated d/c is to: to be determined Anticipated d/c date is: 2-3 days   Subjective and Interval History:  Very somnolent.  Woke to voice, but didn't answer questions.   Objective: Vitals:   06/25/23 1430 06/25/23 1500 06/25/23 1530 06/25/23 1602  BP: 124/61 126/63 (!) 123/59 (!) 112/59  Pulse: 73 73 78 79  Resp: 16 16 15 14   Temp:    98.1 F (36.7 C)  TempSrc:      SpO2: 96% 95% 93% 93%  Weight:      Height:        Intake/Output Summary (Last 24 hours) at 06/25/2023 1813 Last data filed at 06/25/2023 1038 Gross per 24 hour  Intake 457.67 ml  Output --  Net 457.67 ml   Filed Weights   06/24/23 1115  Weight: 102.1 kg    Examination:   Constitutional: NAD, somnolent CV: No cyanosis.   RESP: normal respiratory effort Extremities: Mild edema in BLE SKIN: warm, dry   Data Reviewed: I have personally reviewed labs and imaging studies  Time spent: 50 minutes  Darlin Priestly, MD [facility] If 7PM-7AM, please contact night-coverage 06/25/2023, 6:13 PM '

## 2023-06-25 NOTE — Evaluation (Signed)
Physical Therapy Evaluation Patient Details Name: SHERRIL SHIPMAN MRN: [id number] DOB: 10-Apr-1956 Today's Date: 06/25/2023  History of Present Illness  presented to ER secondary to generalized weakness, fall OOB; admitted for management of acute hepatic encephalopathy  Clinical Impression  Patient resting in bed upon arrival to room; alert and oriented to self, location and general situation.  Follows simple, verbal commands, but does requires increased time for processing and overall response.  Endorses generalized soreness post-fall, FACES 2-4/10, primarily in back and L flank.  Bilat UE/LE generally weak and deconditioned, but functional for basic transfers/gait; no focal weakness appreciated.  Currently requiring min/mod assist for bed mobility; sup for unsupported sitting balance; min/mod assist +2 for sit/stand, standing balance and bed/chair transfer with bilat HHA.  Weak and unsteady; poor endurance and overall dynamic balance noted.  Will plan to progress additional gait with RW next session as appropriate (set up for breakfast at this time). Would benefit from skilled PT to address above deficits and promote optimal return to PLOF.; recommend post-acute PT follow up as indicated by interdisciplinary care team.            If plan is discharge home, recommend the following: A lot of help with walking and/or transfers;A lot of help with bathing/dressing/bathroom   Can travel by private vehicle   Yes    Equipment Recommendations  (has equip necessary)  Recommendations for Other Services       Functional Status Assessment Patient has had a recent decline in their functional status and demonstrates the ability to make significant improvements in function in a reasonable and predictable amount of time.     Precautions / Restrictions Precautions Precautions: Fall Restrictions Weight Bearing Restrictions: No      Mobility  Bed Mobility Overal bed mobility: Needs Assistance Bed  Mobility: Rolling Rolling: Min assist, Mod assist   Supine to sit: Min assist, Mod assist     General bed mobility comments: hand-over-hand to initiate movement/rotation    Transfers                        Ambulation/Gait                  Stairs            Wheelchair Mobility     Tilt Bed    Modified Rankin (Stroke Patients Only)       Balance Overall balance assessment: Needs assistance Sitting-balance support: No upper extremity supported, Feet supported Sitting balance-Leahy Scale: Fair     Standing balance support: Bilateral upper extremity supported Standing balance-Leahy Scale: Poor Standing balance comment: +1-2 for safety                             Pertinent Vitals/Pain Pain Assessment Pain Assessment: Faces Faces Pain Scale: Hurts little more Pain Location: L ribs, back Pain Descriptors / Indicators: Sore Pain Intervention(s): Limited activity within patient's tolerance, Monitored during session, Repositioned    Home Living Family/patient expects to be discharged to:: Private residence Living Arrangements: Spouse/significant other Available Help at Discharge: Family;Available 24 hours/day Type of Home: House Home Access: Stairs to enter Entrance Stairs-Rails: None Entrance Stairs-Number of Steps: 1 + 1 front door   Home Layout: One level Home Equipment: Agricultural consultant (2 wheels);Rollator (4 wheels);Cane - single point;Tub bench;Hand held shower head;Grab bars - tub/shower;Grab bars - toilet Additional Comments: Pt states she uses both the walker and  SPC inside home but in different areas. Dan Humphreys is used for longer distances    Prior Function Prior Level of Function : Needs assist             Mobility Comments: Biomedical engineer; does endorse fall history, diffiult to quantify ADLs Comments: Assisted by family     Extremity/Trunk Assessment   Upper Extremity Assessment Upper Extremity  Assessment: Generalized weakness (grossly 4-/5 throughout)    Lower Extremity Assessment Lower Extremity Assessment: Generalized weakness (grossly 4-/5 throughout)       Communication   Communication Communication: Difficulty following commands/understanding  Cognition Arousal: Alert Behavior During Therapy: WFL for tasks assessed/performed Overall Cognitive Status: No family/caregiver present to determine baseline cognitive functioning                                 General Comments: Oriented to self, location and general situation; follows simple commands; moderate delay in processing and response time. Intermittent redirection to task/conversation at hand (drifts easily)        General Comments      Exercises Other Exercises Other Exercises: Rolling bilat for toileting/bedpan, min/mod assist +1 for rolling; dep for hygiene   Assessment/Plan    PT Assessment Patient needs continued PT services  PT Problem List Decreased strength;Decreased activity tolerance;Decreased balance;Decreased coordination;Decreased mobility;Decreased cognition;Decreased knowledge of use of DME;Decreased safety awareness;Decreased knowledge of precautions       PT Treatment Interventions DME instruction;Gait training;Stair training;Functional mobility training;Therapeutic activities;Therapeutic exercise;Balance training;Cognitive remediation;Patient/family education    PT Goals (Current goals can be found in the Care Plan section)  Acute Rehab PT Goals Patient Stated Goal: to be able to walk better again PT Goal Formulation: With patient Time For Goal Achievement: 07/09/23 Potential to Achieve Goals: Good    Frequency Min 1X/week     Co-evaluation PT/OT/SLP Co-Evaluation/Treatment: Yes Reason for Co-Treatment: Complexity of the patient's impairments (multi-system involvement);Necessary to address cognition/behavior during functional activity;To address functional/ADL  transfers PT goals addressed during session: Mobility/safety with mobility OT goals addressed during session: ADL's and self-care       AM-PAC PT "6 Clicks" Mobility  Outcome Measure Help needed turning from your back to your side while in a flat bed without using bedrails?: A Little Help needed moving from lying on your back to sitting on the side of a flat bed without using bedrails?: A Lot Help needed moving to and from a bed to a chair (including a wheelchair)?: A Lot Help needed standing up from a chair using your arms (e.g., wheelchair or bedside chair)?: A Lot Help needed to walk in hospital room?: A Lot Help needed climbing 3-5 steps with a railing? : A Lot 6 Click Score: 13    End of Session Equipment Utilized During Treatment: Gait belt Activity Tolerance: Patient tolerated treatment well Patient left: in chair;with call bell/phone within reach Nurse Communication: Mobility status PT Visit Diagnosis: Difficulty in walking, not elsewhere classified (R26.2);Muscle weakness (generalized) (M62.81)    Time: 2440-1027 PT Time Calculation (min) (ACUTE ONLY): 32 min   Charges:   PT Evaluation $PT Eval Moderate Complexity: 1 Mod   PT General Charges $$ ACUTE PT VISIT: 1 Visit         Harlene Petralia H. Manson Passey, PT, DPT, NCS 06/25/23, 9:49 AM [phone]

## 2023-06-25 NOTE — ED Notes (Signed)
This RN assisted pt to get comfortable in bed and plugged pt phone in, lifted her heels off the bed for comfort and request. Pt given her lactulose that she requested to be left at bedside and RN told pt she needs to drink it, pt states she will but to give her a minute.

## 2023-06-25 NOTE — Consult Note (Signed)
WOC Nurse Consult Note: Patient with a fall at home.  Abrasions to nose, arms and buttocks.  Left lateral heel pressure injury with scabbing  Right second toe with hammer toe deformity and ecchymosis to dorsal toe. She thinks that is from a shoe and not her fall.  Scabbing to tips of third toes bilaterally Reason for Consult:trauma, pressure injury to left lateral heel.  Wound type: trauma and pressure Pressure Injury POA: Yes left heel unstageable  Measurement:left heel:  1 cm x 1.5 cm scabbed pressure injury Wound ZOX:[id number] Drainage (amount, consistency, odor) none noted Periwound: Bruising to arms and abrasion to nose.  On admission erythema to sacrum that is not noted today.  Bilateral lower legs with nonpitting edema and faint erythema to bilateral anterior lower legs.  Dressing procedure/placement/frequency: Cleanse left heel with NS and pat dry. Apply silicone foam and change every other day.  Encourage to turn and reposition to avoid prolonged pressure to sacral area.  Will not follow at this time.  Please re-consult if needed.  Mike Gip MSN, RN, FNP-BC CWON Wound, Ostomy, Continence Nurse Outpatient [facility] [phone] Pager [phone]

## 2023-06-25 NOTE — ED Notes (Signed)
Pt sleeping at this time, pt arousal and oriented when asked questions. Pt denies any needs

## 2023-06-25 NOTE — Evaluation (Signed)
Occupational Therapy Evaluation Patient Details Name: Rachel Huber MRN: [id number] DOB: 03/12/1956 Today's Date: 06/25/2023   History of Present Illness presented to ER secondary to generalized weakness, fall OOB; admitted for management of acute hepatic encephalopathy   Clinical Impression   Rachel Huber was seen for OT evaluation this date. Prior to hospital admission, pt was generally independent with ADL management. She reports her spouse assists her with bathing and occasional dressing tasks. Pt lives with her spouse in a 1 level home with 1+1 STE. Pt presents to acute OT demonstrating impaired ADL performance and functional mobility 2/2 generalized weakness, decreased cognition, and generalized pain with mobility (See OT problem list for additional functional deficits). Pt currently requires MAX A for bed level peri-hygiene, MIN A +2 for STS and to SPT to the recliner. Pt would benefit from skilled OT services to address noted impairments and functional limitations (see below for any additional details) in order to maximize safety and independence while minimizing falls risk and caregiver burden. Anticipate the need for follow up OT services upon acute hospital DC.        If plan is discharge home, recommend the following: A lot of help with walking and/or transfers;A lot of help with bathing/dressing/bathroom;Assistance with cooking/housework;Assist for transportation;Help with stairs or ramp for entrance;Direct supervision/assist for medications management;Direct supervision/assist for financial management    Functional Status Assessment  Patient has had a recent decline in their functional status and demonstrates the ability to make significant improvements in function in a reasonable and predictable amount of time.  Equipment Recommendations  BSC/3in1    Recommendations for Other Services       Precautions / Restrictions Precautions Precautions: Fall Restrictions Weight  Bearing Restrictions: No      Mobility Bed Mobility Overal bed mobility: Needs Assistance Bed Mobility: Rolling, Sidelying to Sit Rolling: Min assist, Mod assist Sidelying to sit: Min assist, Mod assist       General bed mobility comments: hand-over-hand to initiate movement/rotation    Transfers Overall transfer level: Needs assistance Equipment used: 2 person hand held assist Transfers: Sit to/from Stand, Bed to chair/wheelchair/BSC Sit to Stand: Min assist, +2 safety/equipment Stand pivot transfers: Contact guard assist, +2 safety/equipment                Balance Overall balance assessment: Needs assistance Sitting-balance support: No upper extremity supported, Feet supported Sitting balance-Leahy Scale: Fair     Standing balance support: Bilateral upper extremity supported Standing balance-Leahy Scale: Poor Standing balance comment: +1-2 for safety                           ADL either performed or assessed with clinical judgement   ADL Overall ADL's : Needs assistance/impaired                                       General ADL Comments: Pt functionally limited by generalized weakness, pain with mobility, and decreased cognition. Requires increased time/cueing to complete ADL tasks during session. MAX A for bed level peri-care as no BSC available in ED room. Anticipate increased functional independence with BSC use. Pt able to perform functional transfer to recliner with +2 HHA and MIN A for STS t/f. hands on assist for safety during transfer but no physical assist required.     Vision Baseline Vision/History: 1 Wears glasses Ability to See in  Adequate Light: 1 Impaired Patient Visual Report: No change from baseline       Perception         Praxis         Pertinent Vitals/Pain Pain Assessment Pain Assessment: Faces Faces Pain Scale: Hurts little more Pain Location: L ribs, back Pain Descriptors / Indicators: Sore Pain  Intervention(s): Limited activity within patient's tolerance, Monitored during session, Repositioned     Extremity/Trunk Assessment Upper Extremity Assessment Upper Extremity Assessment: Generalized weakness   Lower Extremity Assessment Lower Extremity Assessment: Generalized weakness   Cervical / Trunk Assessment Cervical / Trunk Assessment: Normal   Communication Communication Communication: Difficulty following commands/understanding Following commands: Follows one step commands inconsistently Cueing Techniques: Verbal cues;Gestural cues;Tactile cues   Cognition Arousal: Alert Behavior During Therapy: WFL for tasks assessed/performed Overall Cognitive Status: No family/caregiver present to determine baseline cognitive functioning                                 General Comments: Oriented to self, location and general situation; follows simple commands; moderate delay in processing and response time. Intermittent redirection to task/conversation at hand (drifts easily)     General Comments       Exercises Other Exercises Other Exercises: Pt educated on safe transfer technique and falls prevention strategies during ADL management including rolling bilat for toileting/bedpan, min/mod assist +1 for rolling; dep for hygiene   Shoulder Instructions      Home Living Family/patient expects to be discharged to:: Private residence Living Arrangements: Spouse/significant other Available Help at Discharge: Family;Available 24 hours/day Type of Home: House Home Access: Stairs to enter [employer] of Steps: 1 + 1 front door Entrance Stairs-Rails: None Home Layout: One level     Bathroom Shower/Tub: Tub/shower unit;Curtain   Bathroom Toilet: Handicapped height     Home Equipment: Agricultural consultant (2 wheels);Rollator (4 wheels);Cane - single point;Tub bench;Hand held shower head;Grab bars - tub/shower;Grab bars - toilet   Additional Comments: Pt states  she uses both the walker and SPC inside home but in different areas. Dan Humphreys is used for longer distances      Prior Functioning/Environment Prior Level of Function : Needs assist             Mobility Comments: Household ambulator using RW; does endorse fall history, diffiult to quantify ADLs Comments: Spouse assists with bathing (washes back), pt reports she is generally independent for dressing, toileting, etc.        OT Problem List: Decreased strength;Decreased cognition;Decreased safety awareness;Decreased activity tolerance;Decreased knowledge of use of DME or AE;Impaired balance (sitting and/or standing);Decreased knowledge of precautions      OT Treatment/Interventions: Self-care/ADL training;Therapeutic exercise;Cognitive remediation/compensation;DME and/or AE instruction;Balance training;Patient/family education;Therapeutic activities    OT Goals(Current goals can be found in the care plan section) Acute Rehab OT Goals Patient Stated Goal: To feel safer and more confident walking with her SPC. OT Goal Formulation: With patient Time For Goal Achievement: 07/09/23 Potential to Achieve Goals: Good ADL Goals Pt Will Perform Grooming: with set-up;with supervision;standing;with adaptive equipment Pt Will Perform Lower Body Dressing: with adaptive equipment;sit to/from stand;with supervision;with set-up Pt Will Transfer to Toilet: bedside commode;with supervision;with set-up;ambulating Pt Will Perform Toileting - Clothing Manipulation and hygiene: with supervision;with set-up;sit to/from stand;with adaptive equipment  OT Frequency: Min 1X/week    Co-evaluation   Reason for Co-Treatment: Complexity of the patient's impairments (multi-system involvement);Necessary to address cognition/behavior during functional activity;To address functional/ADL  transfers PT goals addressed during session: Mobility/safety with mobility OT goals addressed during session: ADL's and self-care       AM-PAC OT "6 Clicks" Daily Activity     Outcome Measure Help from another person eating meals?: None Help from another person taking care of personal grooming?: A Little Help from another person toileting, which includes using toliet, bedpan, or urinal?: A Lot Help from another person bathing (including washing, rinsing, drying)?: A Lot Help from another person to put on and taking off regular upper body clothing?: A Little Help from another person to put on and taking off regular lower body clothing?: A Lot 6 Click Score: 16   End of Session Nurse Communication: Mobility status  Activity Tolerance: Patient tolerated treatment well Patient left: in chair;with call bell/phone within reach  OT Visit Diagnosis: Other abnormalities of gait and mobility (R26.89);Muscle weakness (generalized) (M62.81);Other symptoms and signs involving cognitive function                Time: 5409-8119 OT Time Calculation (min): 30 min Charges:  OT General Charges $OT Visit: 1 Visit OT Evaluation $OT Eval Moderate Complexity: 1 Mod OT Treatments $Self Care/Home Management : 8-22 mins  Rockney Ghee, M.S., OTR/L 06/25/23, 11:49 AM

## 2023-06-25 NOTE — ED Notes (Signed)
Rn assisted pt onto bed pan, x1 urination and slight BM. Cleaned and pt denied other needs

## 2023-06-25 NOTE — ED Notes (Signed)
PT at bedside helping patient

## 2023-06-26 ENCOUNTER — Encounter (HOSPITAL_COMMUNITY): Payer: Medicare Other

## 2023-06-26 ENCOUNTER — Encounter (HOSPITAL_COMMUNITY): Payer: Medicare Other | Admitting: Occupational Therapy

## 2023-06-26 DIAGNOSIS — G9340 Encephalopathy, unspecified: Secondary | ICD-10-CM | POA: Diagnosis not present

## 2023-06-26 LAB — BASIC METABOLIC PANEL
Anion gap: 7 (ref 5–15)
BUN: 33 mg/dL — ABNORMAL HIGH (ref 8–23)
CO2: 18 mmol/L — ABNORMAL LOW (ref 22–32)
Calcium: 8.7 mg/dL — ABNORMAL LOW (ref 8.9–10.3)
Chloride: 116 mmol/L — ABNORMAL HIGH (ref 98–111)
Creatinine, Ser: 1.26 mg/dL — ABNORMAL HIGH (ref 0.44–1.00)
GFR, Estimated: 47 mL/min — ABNORMAL LOW (ref >60)
Glucose, Bld: 155 mg/dL — ABNORMAL HIGH (ref 70–99)
Potassium: 3 mmol/L — ABNORMAL LOW (ref 3.5–5.1)
Sodium: 141 mmol/L (ref 135–145)

## 2023-06-26 LAB — CBC
HCT: 25.7 % — ABNORMAL LOW (ref 36.0–46.0)
Hemoglobin: 8 g/dL — ABNORMAL LOW (ref 12.0–15.0)
MCH: 22.8 pg — ABNORMAL LOW (ref 26.0–34.0)
MCHC: 31.1 g/dL (ref 30.0–36.0)
MCV: 73.2 fL — ABNORMAL LOW (ref 80.0–100.0)
Platelets: 139 10*3/uL — ABNORMAL LOW (ref 150–400)
RBC: 3.51 MIL/uL — ABNORMAL LOW (ref 3.87–5.11)
RDW: 21.6 % — ABNORMAL HIGH (ref 11.5–15.5)
WBC: 7.6 10*3/uL (ref 4.0–10.5)
nRBC: 0.4 % — ABNORMAL HIGH (ref 0.0–0.2)

## 2023-06-26 LAB — GLUCOSE, CAPILLARY
Glucose-Capillary: 134 mg/dL — ABNORMAL HIGH (ref 70–99)
Glucose-Capillary: 142 mg/dL — ABNORMAL HIGH (ref 70–99)
Glucose-Capillary: 123 mg/dL — ABNORMAL HIGH (ref 70–99)
Glucose-Capillary: 161 mg/dL — ABNORMAL HIGH (ref 70–99)

## 2023-06-26 LAB — AMMONIA: Ammonia: 69 umol/L — ABNORMAL HIGH (ref 9–35)

## 2023-06-26 LAB — MAGNESIUM: Magnesium: 1.5 mg/dL — ABNORMAL LOW (ref 1.7–2.4)

## 2023-06-26 MED ORDER — MAGNESIUM SULFATE 4 GM/100ML IV SOLN
4.0000 g | Freq: Once | INTRAVENOUS | Status: AC
  Administered 2023-06-26: 4 g via INTRAVENOUS
  Filled 2023-06-26: qty 100

## 2023-06-26 MED ORDER — POTASSIUM CHLORIDE CRYS ER 20 MEQ PO TBCR
40.0000 meq | EXTENDED_RELEASE_TABLET | ORAL | Status: AC
  Administered 2023-06-26: 40 meq via ORAL
  Filled 2023-06-26 (×2): qty 2

## 2023-06-26 NOTE — Progress Notes (Signed)
  PROGRESS NOTE    BURKLEE BI  VWU:[id number] DOB: 30-Apr-1956 DOA: 06/24/2023 PCP: Lauro Regulus, MD  106A/106A-AA  LOS: 2 days   Brief hospital course:   Assessment & Plan: LOUVELLA BENTHAM is a 67 y.o. 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  Per report, patient fell out of bed this morning.  No reported head trauma loss conscious.  Did have facial abrasion as well as landing on the left rib area.  Noted baseline chronic encephalopathy in the setting of Nash cirrhosis.  Missed dose of lactulose today.  No fevers or chills.  No nausea or vomiting.  Mild weakness.  No reported active bleeding.  Does report getting intermittent transfusions at [facility].    Acute hepatic encephalopathy (HCC) Worsening confusion with uptrending ammonia level in the setting of baseline NASH cirrhosis Missed dosing of lactulose on the day of presentation Ammonia level 56-->92  Plan: --cont lactulose and rifaximin  --Ensure at least 3 BM's per day  Chronic Anemia --with intermittent transfusion dependence.  No reported active bleeding currently. --s/p 1u pRBC for Hemoglobin 6.5 --monitor Hgb and transfuse to keep >7  Liver cirrhosis secondary to NASH (HCC) Continue home torsemide and spironolactone   Fall at home, initial encounter Positive fall getting out of bed today in the setting of decompensated encephalopathy Positive facial abrasion as well as fall on the left ribs without reported head trauma or loss of consciousness. No acute trauma noted on CT imaging  --fall  precautions  Thrombocytopenia (HCC) Plt count in 120s  Stable   Chronic kidney disease, stage 3b (HCC) Creatinine 1.39 with GFR in the 40s  DM2 --A1c 6.8 about 3 months ago. --d/c BG checks and SSI   DVT prophylaxis: Lovenox SQ Code Status: Full code  Family Communication: husband updated at bedside today Level of care: Med-Surg Dispo:   The patient is from: home Anticipated d/c is to: SNF rehab Anticipated d/c date is: whenever bed available   Subjective and Interval History:  More alert today, reported having BM's.  Also good urine output.   Objective: Vitals:   06/25/23 1954 06/26/23 0422 06/26/23 0729 06/26/23 1535  BP: 137/68 (!) 133/50 128/63 (!) 128/51  Pulse: 83 93 87 81  Resp: 20 18 16 18   Temp: 99.1 F (37.3 C) 99.4 F (37.4 C) 99.3 F (37.4 C) 98.7 F (37.1 C)  TempSrc:  Oral    SpO2: 91% 94% 96% 100%  Weight:      Height:        Intake/Output Summary (Last 24 hours) at 06/26/2023 1843 Last data filed at 06/26/2023 1800 Gross per 24 hour  Intake 149.05 ml  Output --  Net 149.05 ml   Filed Weights   06/24/23 1115  Weight: 102.1 kg    Examination:   Constitutional: NAD, AAOx3 HEENT: conjunctivae and lids normal, EOMI CV: No cyanosis.   RESP: normal respiratory effort, on RA Extremities: edema in BLE SKIN: warm, dry   Data Reviewed: I have personally reviewed labs and imaging studies  Time spent: 35 minutes  Darlin Priestly, MD [facility] If 7PM-7AM, please contact night-coverage 06/26/2023, 6:43 PM '

## 2023-06-26 NOTE — Progress Notes (Signed)
Physical Therapy Treatment Patient Details Name: MERRITT KINGMA MRN: [id number] DOB: [birth year] Today's Date: 06/26/2023   History of Present Illness presented to ER secondary to generalized weakness, fall OOB; admitted for management of acute hepatic encephalopathy    PT Comments  Pt sleeping in bed upon entry, husband woke her up, required increased time and multiple hints to answer all orientation questions 2/2 lethargy. Pt performed supine therex with constant verbal cuing to stay alert. Pt required modA for bed mobility and upon sitting became more alert and requested to go to the bathroom. MinA for STS transfers and CGA for step pivot transfers with RW and with 2nd person assist for IV pole management. Per pt request, pt amb 4 steps with CGA and RW to recliner. BP and HR assessed pre, during, and post session and stable t/o. Pt reporting mild stomach pain at the end of session. Mobility limited by lethargy and cardiovascular/muscular fatigue. Pt would continue to benefit from skilled acute therapy to address mobility goals.    If plan is discharge home, recommend the following: A lot of help with walking and/or transfers;A lot of help with bathing/dressing/bathroom   Can travel by private vehicle     Yes  Equipment Recommendations  None recommended by PT    Recommendations for Other Services       Precautions / Restrictions Precautions Precautions: Fall Restrictions Weight Bearing Restrictions: No     Mobility  Bed Mobility Overal bed mobility: Needs Assistance Bed Mobility: Supine to Sit   Sidelying to sit: Mod assist, Used rails, HOB elevated       General bed mobility comments: able to initiate BLE, but required modA at trunk and to scoot EOB    Transfers Overall transfer level: Needs assistance Equipment used: Rolling walker (2 wheels) Transfers: Sit to/from Stand, Bed to chair/wheelchair/BSC Sit to Stand: Min assist   Step pivot transfers: Contact guard  assist       General transfer comment: minA to stand from EOB and BSC; CGA for step pivot transfer with 2nd assist for IV pole; hand placement and sequencing cues    Ambulation/Gait Ambulation/Gait assistance: Contact guard assist Gait Distance (Feet): 4 Feet Assistive device: Rolling walker (2 wheels) Gait Pattern/deviations: Shuffle, Trunk flexed, Narrow base of support Gait velocity: decreased     General Gait Details: 4 steps to walk from [facility] to recliner   Stairs             Wheelchair Mobility     Tilt Bed    Modified Rankin (Stroke Patients Only)       Balance Overall balance assessment: Needs assistance Sitting-balance support: No upper extremity supported, Feet supported, Bilateral upper extremity supported Sitting balance-Leahy Scale: Fair Sitting balance - Comments: no UE support EOB, requiring CGA; BUE support on BSC, requiring supervision   Standing balance support: Reliant on assistive device for balance, Bilateral upper extremity supported Standing balance-Leahy Scale: Fair Standing balance comment: no overt LOB in standing                            Cognition Arousal: Lethargic, Alert (more alert as session went on) Behavior During Therapy: WFL for tasks assessed/performed Overall Cognitive Status: Impaired/Different from baseline                                 General Comments: Increased time and hints to answer  all orientation questions; pt sleeping upon entry, woken up by her husband, very lethargic/difficult to keep eyes open, alertness progressed with mobility        Exercises General Exercises - Lower Extremity Ankle Circles/Pumps: AROM, Both, 10 reps, Supine Gluteal Sets: AROM, Strengthening, 10 reps, Supine, Both Heel Slides: AROM, Both, 5 reps, Strengthening, Supine    General Comments        Pertinent Vitals/Pain Pain Assessment Pain Assessment: Faces Faces Pain Scale: Hurts little more Pain  Location: stomach Pain Descriptors / Indicators: Sore Pain Intervention(s): Limited activity within patient's tolerance, Repositioned, Monitored during session End of session vitals: BP 133/60, HR 84, O2 95%    Home Living                          Prior Function            PT Goals (current goals can now be found in the care plan section) Acute Rehab PT Goals Patient Stated Goal: to be able to walk better again PT Goal Formulation: With patient Time For Goal Achievement: 07/09/23 Potential to Achieve Goals: Good Progress towards PT goals: Progressing toward goals    Frequency    Min 1X/week      PT Plan      Co-evaluation              AM-PAC PT "6 Clicks" Mobility   Outcome Measure  Help needed turning from your back to your side while in a flat bed without using bedrails?: A Little Help needed moving from lying on your back to sitting on the side of a flat bed without using bedrails?: A Lot Help needed moving to and from a bed to a chair (including a wheelchair)?: A Little Help needed standing up from a chair using your arms (e.g., wheelchair or bedside chair)?: A Little Help needed to walk in hospital room?: A Little Help needed climbing 3-5 steps with a railing? : A Lot 6 Click Score: 16    End of Session Equipment Utilized During Treatment: Gait belt Activity Tolerance: Patient tolerated treatment well;Patient limited by lethargy Patient left: in chair;with call bell/phone within reach;with chair alarm set Nurse Communication: Mobility status (pain status) PT Visit Diagnosis: Difficulty in walking, not elsewhere classified (R26.2);Muscle weakness (generalized) (M62.81)     Time: 1610-9604 PT Time Calculation (min) (ACUTE ONLY): 36 min  Charges:    $Therapeutic Exercise: 8-22 mins $Therapeutic Activity: 8-22 mins PT General Charges $$ ACUTE PT VISIT: 1 Visit                       Shauna Hugh, SPT 06/26/2023, 4:10 PM

## 2023-06-26 NOTE — Progress Notes (Signed)
Patient introduced to role of nurse navigator. Patient known to navigator from previous admissions. Intake questions completed, although mental status seems to wax and wane some during discussion. Patient also noted to have delay in responses.  Patient first states she lives home alone; however upon further conversation endorses living with her husband, who she states drives her to/from appointments. She denies any SDOH needs a present and does have a PCP.

## 2023-06-26 NOTE — Care Management Important Message (Signed)
Important Message  Patient Details  Name: Rachel Huber MRN: [id number] Date of Birth: 22-Feb-1956   Important Message Given:  N/A - LOS <3 / Initial given by admissions     Olegario Messier A Brenlynn Fake 06/26/2023, 7:39 AM

## 2023-06-26 NOTE — TOC Initial Note (Signed)
Transition of Care [facility]) - Initial/Assessment Note    Patient Details  Name: Rachel Huber MRN: [id number] Date of Birth: 1956-05-15  Transition of Care [facility]) CM/SW Contact:    Allena Katz, LCSW Phone Number: 06/26/2023, 11:04 AM  Clinical Narrative:  CSW spoke with husband about rehab recommendation. Husband reports that pt was at [facility] rehab for outpatient services prior to admission and states pt was inpatient at their rehab as well. Husband is unsure when. Husband states he would like referral sent in [place] area and back to [facility]. Husband states she walks at home with a RW and a cane. CSW to start workup.                  Expected Discharge Plan: Skilled Nursing Facility Barriers to Discharge: Continued Medical Work up   Patient Goals and CMS Choice            Expected Discharge Plan and Services                                              Prior Living Arrangements/Services   Lives with:: Spouse   Do you feel safe going back to the place where you live?: Yes      Need for Family Participation in Patient Care: Yes (Comment)        Activities of Daily Living   ADL Screening (condition at time of admission) Independently performs ADLs?: No Does the patient have a NEW difficulty with bathing/dressing/toileting/self-feeding that is expected to last >3 days?: No Does the patient have a NEW difficulty with getting in/out of bed, walking, or climbing stairs that is expected to last >3 days?: No Does the patient have a NEW difficulty with communication that is expected to last >3 days?: No Is the patient deaf or have difficulty hearing?: No Does the patient have difficulty seeing, even when wearing glasses/contacts?: No Does the patient have difficulty concentrating, remembering, or making decisions?: No  Permission Sought/Granted                  Emotional Assessment       Orientation: : Fluctuating Orientation (Suspected  and/or reported Sundowners)      Admission diagnosis:  Encephalopathy [G93.40] 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 [T59.891A, G92.8] Patient Active Problem List   Diagnosis Date Noted   Encephalopathy 06/24/2023   Beta thalassemia intermedia (HCC) 05/25/2023   Gout due to renal impairment 05/12/2023   Unspecified protein-calorie malnutrition (HCC) 05/06/2023   Hypoalbuminemia due to protein-calorie malnutrition (HCC) 05/05/2023   Vitamin D deficiency 05/05/2023   Bilateral lower extremity edema 05/05/2023   Aortic atherosclerosis (HCC) 05/05/2023   AMS (altered mental status) 04/29/2023   Hepatic encephalopathy (HCC) 03/25/2023   Acute metabolic encephalopathy 03/25/2023   Acute cystitis with hematuria 03/25/2023   Acute hepatic encephalopathy (HCC) 01/19/2023   Pancytopenia (HCC) 01/06/2023   Anemia 01/06/2023   Internal hemorrhoid 01/06/2023   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   Symptomatic anemia 12/17/2022   Hypokalemia 11/06/2022   Acute on chronic anemia 11/03/2022   Hypotension 11/03/2022   AKI (acute kidney injury) (HCC) 10/15/2022   Rectal bleeding 06/17/2022   Obesity (BMI 30-39.9) 06/17/2022   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 mellitus (HCC) 06/17/2022   Grade IV internal hemorrhoids 05/16/2022   Thrombocytopenia (HCC) 03/05/2022   Anemia in chronic kidney disease (CKD)  02/07/2022   GI bleeding 02/04/2022   Chronic kidney disease, stage 3b (HCC) 02/04/2022   Depression with anxiety 02/04/2022   Liver cirrhosis secondary to NASH (HCC)    Insomnia 01/27/2022   Iron deficiency anemia due to chronic blood loss 02/13/2021   History of uterine cancer 02/13/2021   Decompensated hepatic cirrhosis (HCC) 10/25/2020   OSA on CPAP 10/25/2020   Closed right hip fracture (HCC) 09/02/2020   Type 2 diabetes mellitus with hyperlipidemia (HCC) 09/02/2020   Thalassemia minor    HLD (hyperlipidemia)    Depression    Fall at home, initial encounter    PCP:  Lauro Regulus,  MD Pharmacy:   [facility] [address], [place] - [address] [place] [address] Phone: [phone] Fax: [phone]     Social Determinants of Health (SDOH) Social History: SDOH Screenings   Food Insecurity: Patient Unable To Answer (06/25/2023)  Housing: Patient Unable To Answer (06/25/2023)  Transportation Needs: Patient Unable To Answer (06/25/2023)  Utilities: Patient Unable To Answer (06/25/2023)  Alcohol Screen: Low Risk  (04/04/2022)  Depression (PHQ2-9): Low Risk  (04/27/2023)  Financial Resource Strain: Low Risk  (06/03/2023)   Received from [facility]  Physical Activity: Inactive (04/04/2022)  Social Connections: Moderately Isolated (04/04/2022)  Stress: No Stress Concern Present (04/04/2022)  Tobacco Use: Medium Risk (06/25/2023)   SDOH Interventions:     Readmission Risk Interventions    04/30/2023   12:35 PM 12/19/2022   10:27 AM 11/04/2022   11:32 AM  Readmission Risk Prevention Plan  Transportation Screening Complete Complete Complete  Medication Review Oceanographer) Complete Complete Complete  PCP or Specialist appointment within 3-5 days of discharge Not Complete Complete Complete  HRI or Home Care Consult Complete Complete   SW Recovery Care/Counseling Consult Complete Complete Complete  Palliative Care Screening Not Applicable Not Applicable Not Applicable  Skilled Nursing Facility Not Complete Not Applicable Not Applicable

## 2023-06-26 NOTE — [place] FL2 (Signed)
[place] MEDICAID FL2 LEVEL OF CARE FORM     IDENTIFICATION  Patient Name: Rachel Huber Birthdate: Sep 25, 1955 Sex: female Admission Date (Current Location): 06/24/2023  [place] [facility] and [place] Number:  Chiropodist and Address:  [facility], [address] [place], [place] [address]      Provider Number: [id number]  Attending Physician Name and Address:  Darlin Priestly, MD  Relative Name and Phone Number:       Current Level of Care: Hospital Recommended Level of Care: Skilled Nursing Facility Prior Approval Number:    Date Approved/Denied:   PASRR Number: [id number]  Discharge Plan: SNF    Current Diagnoses: Patient Active Problem List   Diagnosis Date Noted   Encephalopathy 06/24/2023   Beta thalassemia intermedia (HCC) 05/25/2023   Gout due to renal impairment 05/12/2023   Unspecified protein-calorie malnutrition (HCC) 05/06/2023   Hypoalbuminemia due to protein-calorie malnutrition (HCC) 05/05/2023   Vitamin D deficiency 05/05/2023   Bilateral lower extremity edema 05/05/2023   Aortic atherosclerosis (HCC) 05/05/2023   AMS (altered mental status) 04/29/2023   Hepatic encephalopathy (HCC) 03/25/2023   Acute metabolic encephalopathy 03/25/2023   Acute cystitis with hematuria 03/25/2023   Acute hepatic encephalopathy (HCC) 01/19/2023   Pancytopenia (HCC) 01/06/2023   Anemia 01/06/2023   Internal hemorrhoid 01/06/2023   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   Symptomatic anemia 12/17/2022   Hypokalemia 11/06/2022   Acute on chronic anemia 11/03/2022   Hypotension 11/03/2022   AKI (acute kidney injury) (HCC) 10/15/2022   Rectal bleeding 06/17/2022   Obesity (BMI 30-39.9) 06/17/2022   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 mellitus (HCC) 06/17/2022   Grade IV internal hemorrhoids 05/16/2022   Thrombocytopenia (HCC) 03/05/2022   Anemia in chronic kidney disease (CKD) 02/07/2022   GI bleeding 02/04/2022   Chronic kidney disease, stage 3b  (HCC) 02/04/2022   Depression with anxiety 02/04/2022   Liver cirrhosis secondary to NASH (HCC)    Insomnia 01/27/2022   Iron deficiency anemia due to chronic blood loss 02/13/2021   History of uterine cancer 02/13/2021   Decompensated hepatic cirrhosis (HCC) 10/25/2020   OSA on CPAP 10/25/2020   Closed right hip fracture (HCC) 09/02/2020   Type 2 diabetes mellitus with hyperlipidemia (HCC) 09/02/2020   Thalassemia minor    HLD (hyperlipidemia)    Depression    Fall at home, initial encounter     Orientation RESPIRATION BLADDER Height & Weight     Self, Place  Normal Incontinent Weight: 225 lb (102.1 kg) Height:  5\' 9"  (175.3 cm)  BEHAVIORAL SYMPTOMS/MOOD NEUROLOGICAL BOWEL NUTRITION STATUS      Incontinent    AMBULATORY STATUS COMMUNICATION OF NEEDS Skin   Extensive Assist Verbally  (L heel unstageable, Stage 1 sacrum wound)                       Personal Care Assistance Level of Assistance  Bathing, Feeding, Dressing Bathing Assistance: Maximum assistance Feeding assistance: Maximum assistance Dressing Assistance: Maximum assistance     Functional Limitations Info  Sight, Hearing, Speech Sight Info: Impaired Hearing Info: Adequate Speech Info: Adequate    SPECIAL CARE FACTORS FREQUENCY  PT (By licensed PT), OT (By licensed OT)     PT Frequency: 5 times a week OT Frequency: 5 times a week            Contractures Contractures Info: Not present    Additional Factors Info  Code Status Code Status Info: FULL  Current Medications (06/26/2023):  This is the current hospital active medication list Current Facility-Administered Medications  Medication Dose Route Frequency Provider Last Rate Last Admin   Chlorhexidine Gluconate Cloth 2 % PADS 6 each  6 each Topical Daily Darlin Priestly, MD   6 each at 06/26/23 0954   enoxaparin (LOVENOX) injection 50 mg  0.5 mg/kg Subcutaneous Q24H Darlin Priestly, MD   50 mg at 06/25/23 2153   insulin aspart (novoLOG)  injection 0-9 Units  0-9 Units Subcutaneous TID WC Floydene Flock, MD   1 Units at 06/26/23 0954   lactulose (CHRONULAC) 10 GM/15ML solution 45 g  45 g Oral TID Floydene Flock, MD   45 g at 06/26/23 0953   magnesium sulfate IVPB 4 g 100 mL  4 g Intravenous Once Darlin Priestly, MD       ondansetron [facility]) tablet 4 mg  4 mg Oral Q6H PRN Floydene Flock, MD       Or   ondansetron [facility]) injection 4 mg  4 mg Intravenous Q6H PRN Floydene Flock, MD   4 mg at 06/25/23 2322   potassium chloride SA (KLOR-CON M) CR tablet 40 mEq  40 mEq Oral Q4H Darlin Priestly, MD       rifaximin [facility]) tablet 550 mg  550 mg Oral BID Floydene Flock, MD   550 mg at 06/26/23 4696     Discharge Medications: Please see discharge summary for a list of discharge medications.  Relevant Imaging Results:  Relevant Lab Results:   Additional Information SS#[id number]  Allena Katz, LCSW

## 2023-06-26 NOTE — Plan of Care (Signed)
  Problem: Education: Goal: Ability to describe self-care measures that may prevent or decrease complications (Diabetes Survival Skills Education) will improve Outcome: Completed/Met Goal: Individualized Educational Video(s) Outcome: Completed/Met   Problem: Coping: Goal: Ability to adjust to condition or change in health will improve Outcome: Completed/Met   Problem: Fluid Volume: Goal: Ability to maintain a balanced intake and output will improve Outcome: Completed/Met   Problem: Health Behavior/Discharge Planning: Goal: Ability to identify and utilize available resources and services will improve Outcome: Completed/Met Goal: Ability to manage health-related needs will improve Outcome: Completed/Met   Problem: Metabolic: Goal: Ability to maintain appropriate glucose levels will improve Outcome: Completed/Met   Problem: Nutritional: Goal: Maintenance of adequate nutrition will improve Outcome: Completed/Met Goal: Progress toward achieving an optimal weight will improve Outcome: Completed/Met   Problem: Skin Integrity: Goal: Risk for impaired skin integrity will decrease Outcome: Completed/Met   Problem: Tissue Perfusion: Goal: Adequacy of tissue perfusion will improve Outcome: Completed/Met   Problem: Education: Goal: Knowledge of General Education information will improve Description: Including pain rating scale, medication(s)/side effects and non-pharmacologic comfort measures Outcome: Completed/Met   Problem: Health Behavior/Discharge Planning: Goal: Ability to manage health-related needs will improve Outcome: Completed/Met   Problem: Clinical Measurements: Goal: Ability to maintain clinical measurements within normal limits will improve Outcome: Completed/Met Goal: Will remain free from infection Outcome: Completed/Met Goal: Diagnostic test results will improve Outcome: Completed/Met Goal: Respiratory complications will improve Outcome: Completed/Met Goal:  Cardiovascular complication will be avoided Outcome: Completed/Met   Problem: Activity: Goal: Risk for activity intolerance will decrease Outcome: Completed/Met   Problem: Nutrition: Goal: Adequate nutrition will be maintained Outcome: Completed/Met   Problem: Coping: Goal: Level of anxiety will decrease Outcome: Completed/Met   Problem: Elimination: Goal: Will not experience complications related to bowel motility Outcome: Completed/Met Goal: Will not experience complications related to urinary retention Outcome: Completed/Met   Problem: Pain Management: Goal: General experience of comfort will improve Outcome: Completed/Met   Problem: Safety: Goal: Ability to remain free from injury will improve Outcome: Completed/Met   Problem: Skin Integrity: Goal: Risk for impaired skin integrity will decrease Outcome: Completed/Met   Problem: Education: Goal: Individualized Educational Video(s) Outcome: Completed/Met   Problem: Coping: Goal: Ability to adjust to condition or change in health will improve Outcome: Completed/Met   Problem: Fluid Volume: Goal: Ability to maintain a balanced intake and output will improve Outcome: Completed/Met   Problem: Health Behavior/Discharge Planning: Goal: Ability to identify and utilize available resources and services will improve Outcome: Completed/Met

## 2023-06-27 DIAGNOSIS — K7682 Hepatic encephalopathy: Secondary | ICD-10-CM | POA: Diagnosis not present

## 2023-06-27 DIAGNOSIS — D649 Anemia, unspecified: Secondary | ICD-10-CM | POA: Diagnosis not present

## 2023-06-27 DIAGNOSIS — G9341 Metabolic encephalopathy: Secondary | ICD-10-CM

## 2023-06-27 DIAGNOSIS — D696 Thrombocytopenia, unspecified: Secondary | ICD-10-CM

## 2023-06-27 DIAGNOSIS — W19XXXA Unspecified fall, initial encounter: Secondary | ICD-10-CM | POA: Diagnosis not present

## 2023-06-27 DIAGNOSIS — N1832 Chronic kidney disease, stage 3b: Secondary | ICD-10-CM

## 2023-06-27 LAB — CBC
HCT: 22.9 % — ABNORMAL LOW (ref 36.0–46.0)
Hemoglobin: 7.1 g/dL — ABNORMAL LOW (ref 12.0–15.0)
MCH: 22.6 pg — ABNORMAL LOW (ref 26.0–34.0)
MCHC: 31 g/dL (ref 30.0–36.0)
MCV: 72.9 fL — ABNORMAL LOW (ref 80.0–100.0)
Platelets: 136 10*3/uL — ABNORMAL LOW (ref 150–400)
RBC: 3.14 MIL/uL — ABNORMAL LOW (ref 3.87–5.11)
RDW: 21.3 % — ABNORMAL HIGH (ref 11.5–15.5)
WBC: 7.8 10*3/uL (ref 4.0–10.5)
nRBC: 0.5 % — ABNORMAL HIGH (ref 0.0–0.2)

## 2023-06-27 LAB — MAGNESIUM: Magnesium: 2.1 mg/dL (ref 1.7–2.4)

## 2023-06-27 LAB — BASIC METABOLIC PANEL
Anion gap: 6 (ref 5–15)
BUN: 30 mg/dL — ABNORMAL HIGH (ref 8–23)
CO2: 19 mmol/L — ABNORMAL LOW (ref 22–32)
Calcium: 8.5 mg/dL — ABNORMAL LOW (ref 8.9–10.3)
Chloride: 112 mmol/L — ABNORMAL HIGH (ref 98–111)
Creatinine, Ser: 1.26 mg/dL — ABNORMAL HIGH (ref 0.44–1.00)
GFR, Estimated: 47 mL/min — ABNORMAL LOW (ref >60)
Glucose, Bld: 115 mg/dL — ABNORMAL HIGH (ref 70–99)
Potassium: 3.2 mmol/L — ABNORMAL LOW (ref 3.5–5.1)
Sodium: 137 mmol/L (ref 135–145)

## 2023-06-27 LAB — PREPARE RBC (CROSSMATCH)

## 2023-06-27 MED ORDER — SODIUM CHLORIDE 0.9% IV SOLUTION: Freq: Once | INTRAVENOUS | Status: AC

## 2023-06-27 MED ORDER — POTASSIUM CHLORIDE CRYS ER 20 MEQ PO TBCR
40.0000 meq | EXTENDED_RELEASE_TABLET | Freq: Once | ORAL | Status: AC
  Administered 2023-06-27: 40 meq via ORAL
  Filled 2023-06-27: qty 2

## 2023-06-27 NOTE — TOC Progression Note (Signed)
Transition of Care [facility]) - Progression Note    Patient Details  Name: DEVONDRA NAKANO MRN: [id number] Date of Birth: [birth year]  Transition of Care [facility]) CM/SW Contact  Kemper Durie, RN Phone Number: 06/27/2023, 3:27 PM  Clinical Narrative:     Spoke with patient and husband at bedside.  Bed offers presented, they will review and make decision on choice.  TOC will continue to follow.   Expected Discharge Plan: Skilled Nursing Facility Barriers to Discharge: Continued Medical Work up  Expected Discharge Plan and Services                                               Social Determinants of Health (SDOH) Interventions SDOH Screenings   Food Insecurity: Patient Unable To Answer (06/25/2023)  Housing: Patient Unable To Answer (06/25/2023)  Transportation Needs: Patient Unable To Answer (06/25/2023)  Utilities: Patient Unable To Answer (06/25/2023)  Alcohol Screen: Low Risk  (04/04/2022)  Depression (PHQ2-9): Low Risk  (04/27/2023)  Financial Resource Strain: Low Risk  (06/03/2023)   Received from [facility]  Physical Activity: Inactive (04/04/2022)  Social Connections: Moderately Isolated (04/04/2022)  Stress: No Stress Concern Present (04/04/2022)  Tobacco Use: Medium Risk (06/25/2023)    Readmission Risk Interventions    04/30/2023   12:35 PM 12/19/2022   10:27 AM 11/04/2022   11:32 AM  Readmission Risk Prevention Plan  Transportation Screening Complete Complete Complete  Medication Review Oceanographer) Complete Complete Complete  PCP or Specialist appointment within 3-5 days of discharge Not Complete Complete Complete  HRI or Home Care Consult Complete Complete   SW Recovery Care/Counseling Consult Complete Complete Complete  Palliative Care Screening Not Applicable Not Applicable Not Applicable  Skilled Nursing Facility Not Complete Not Applicable Not Applicable

## 2023-06-27 NOTE — Progress Notes (Signed)
PROGRESS NOTE    Rachel Huber  NFA:[id number] DOB: 30-May-1956 DOA: 06/24/2023 PCP: Lauro Regulus, MD  106A/106A-AA  LOS: 3 days   Brief hospital course: Rachel Huber is a 67 y.o. 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  Per report, patient fell out of bed this morning.  No reported head trauma loss conscious.  Did have facial abrasion as well as landing on the left rib area.  Noted baseline chronic encephalopathy in the setting of Nash cirrhosis.  Missed dose of lactulose today.  No fevers or chills.  No nausea or vomiting.  Mild weakness.  No reported active bleeding.  Does report getting intermittent transfusions at [facility].   11/9: Hemoglobin decreased to 7.1, ordered another unit of PRBC.  More alert and oriented today  Assessment & Plan:   Acute hepatic encephalopathy (HCC) More alert and oriented today.  History of Rachel Huber cirrhosis Missed dosing of lactulose on the day of presentation Ammonia level 56-->92  Plan: --cont lactulose and rifaximin  --Ensure at least 3 BM's per day  Chronic Anemia --with intermittent transfusion dependence.  No reported active bleeding currently.  Hemoglobin at 7.1 today, she received 1 unit of PRBC with hemoglobin of 6.5 --Ordered 1 more unit --monitor Hgb and transfuse to keep >7  Liver cirrhosis secondary to NASH (HCC) Continue home torsemide and spironolactone   Fall at home, initial encounter Positive fall getting out of bed today in the setting of decompensated encephalopathy Positive facial abrasion as well as fall on the left  ribs without reported head trauma or loss of consciousness. No acute trauma noted on CT imaging  --fall precautions  Thrombocytopenia (HCC) Plt count in 120s  Stable   Chronic kidney disease, stage 3b (HCC) Creatinine 1.39 with GFR in the 40s  DM2 --A1c 6.8 about 3 months ago. --d/c BG checks and SSI   DVT prophylaxis: Lovenox SQ Code Status: Full code  Family Communication: husband updated at bedside today Level of care: Med-Surg Dispo:   The patient is from: home Anticipated d/c is to: SNF rehab Anticipated d/c date is: whenever bed available   Subjective and Interval History:  Patient was more alert and oriented today.  Having some abdominal pain, no N/V.  Objective: Vitals:   06/27/23 0756 06/27/23 1116 06/27/23 1130 06/27/23 1430  BP: (!) 133/52 (!) 122/59 (!) 127/57 (!) 135/59  Pulse: 100 76 75 76  Resp: 18 16 16 16   Temp: 98 F (36.7 C) 98.2 F (36.8 C) 98.3 F (36.8 C) 98.2 F (36.8 C)  TempSrc:  Oral Oral Axillary  SpO2: 97% 99% 98%   Weight:      Height:        Intake/Output Summary (Last 24 hours) at 06/27/2023 1516 Last data filed at 06/27/2023 1430 Gross per 24 hour  Intake 439.05 ml  Output --  Net 439.05 ml   Filed Weights   06/24/23 1115  Weight: 102.1 kg    Examination:   General.  Obese lady, in no acute distress. Pulmonary.  Lungs clear bilaterally, normal respiratory effort. CV.  Regular rate and rhythm, no JVD, rub or murmur. Abdomen.  Soft, nontender,  nondistended, BS positive. CNS.  Alert and oriented .  No focal neurologic deficit. Extremities.  No edema, no cyanosis, pulses intact and symmetrical. Psychiatry.  Judgment and insight appears normal.    Data Reviewed: I have personally reviewed labs and imaging studies  Time spent: 40 minutes  Arnetha Courser, MD [facility] If 7PM-7AM, please contact night-coverage 06/27/2023, 3:16 PM '

## 2023-06-28 DIAGNOSIS — G9341 Metabolic encephalopathy: Secondary | ICD-10-CM | POA: Diagnosis not present

## 2023-06-28 LAB — TYPE AND SCREEN
ABO/RH(D): A POS
Antibody Screen: NEGATIVE
Unit division: 0
Unit division: 0

## 2023-06-28 LAB — CBC
HCT: 24.6 % — ABNORMAL LOW (ref 36.0–46.0)
Hemoglobin: 7.7 g/dL — ABNORMAL LOW (ref 12.0–15.0)
MCH: 23.4 pg — ABNORMAL LOW (ref 26.0–34.0)
MCHC: 31.3 g/dL (ref 30.0–36.0)
MCV: 74.8 fL — ABNORMAL LOW (ref 80.0–100.0)
Platelets: 122 10*3/uL — ABNORMAL LOW (ref 150–400)
RBC: 3.29 MIL/uL — ABNORMAL LOW (ref 3.87–5.11)
RDW: 21.3 % — ABNORMAL HIGH (ref 11.5–15.5)
WBC: 5.3 10*3/uL (ref 4.0–10.5)
nRBC: 1 % — ABNORMAL HIGH (ref 0.0–0.2)

## 2023-06-28 LAB — BASIC METABOLIC PANEL
Anion gap: 2 — ABNORMAL LOW (ref 5–15)
BUN: 31 mg/dL — ABNORMAL HIGH (ref 8–23)
CO2: 19 mmol/L — ABNORMAL LOW (ref 22–32)
Calcium: 8.2 mg/dL — ABNORMAL LOW (ref 8.9–10.3)
Chloride: 114 mmol/L — ABNORMAL HIGH (ref 98–111)
Creatinine, Ser: 1.3 mg/dL — ABNORMAL HIGH (ref 0.44–1.00)
GFR, Estimated: 46 mL/min — ABNORMAL LOW (ref >60)
Glucose, Bld: 143 mg/dL — ABNORMAL HIGH (ref 70–99)
Potassium: 3.4 mmol/L — ABNORMAL LOW (ref 3.5–5.1)
Sodium: 135 mmol/L (ref 135–145)

## 2023-06-28 LAB — BPAM RBC
Blood Product Expiration Date: 202411252359
Blood Product Expiration Date: 202412062359
ISSUE DATE / TIME: 202411091109
ISSUE DATE / TIME: 202411061837
Unit Type and Rh: 6200
Unit Type and Rh: 6200

## 2023-06-28 LAB — MAGNESIUM: Magnesium: 2 mg/dL (ref 1.7–2.4)

## 2023-06-28 MED ORDER — LACTULOSE 10 GM/15ML PO SOLN: 30.0000 g | Freq: Three times a day (TID) | ORAL | Status: DC

## 2023-06-28 MED ORDER — POTASSIUM CHLORIDE CRYS ER 20 MEQ PO TBCR
40.0000 meq | EXTENDED_RELEASE_TABLET | Freq: Once | ORAL | Status: AC
  Administered 2023-06-28: 40 meq via ORAL
  Filled 2023-06-28: qty 2

## 2023-06-28 MED ORDER — LACTULOSE 10 GM/15ML PO SOLN
30.0000 g | Freq: Two times a day (BID) | ORAL | Status: DC
  Administered 2023-06-28 (×2): 30 g via ORAL
  Filled 2023-06-28 (×2): qty 60

## 2023-06-28 NOTE — Progress Notes (Signed)
PROGRESS NOTE    Rachel Huber  BJY:[id number] DOB: [birth year] DOA: 06/24/2023 PCP: Lauro Regulus, MD  106A/106A-AA  LOS: 4 days   Brief hospital course:   Assessment & Plan: Rachel Huber is a 67 y.o. 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  Per report, patient fell out of bed this morning.  No reported head trauma loss conscious.  Did have facial abrasion as well as landing on the left rib area.  Noted baseline chronic encephalopathy in the setting of Nash cirrhosis.  Missed dose of lactulose today.  No fevers or chills.  No nausea or vomiting.  Mild weakness.  No reported active bleeding.  Does report getting intermittent transfusions at [facility].    Acute hepatic encephalopathy (HCC) Worsening confusion with uptrending ammonia level in the setting of baseline NASH cirrhosis Missed dosing of lactulose on the day of presentation Ammonia level 56-->92  Plan: --reduce lactulose to 30g BID --cont rifaximin --Ensure at least 3 BM's per day  Chronic Anemia --with intermittent transfusion dependence.  No reported active bleeding currently. --s/p 1u pRBC for Hemoglobin 6.5 --monitor Hgb and transfuse to keep >7  Liver cirrhosis secondary to NASH [facility]) home torsemide and spironolactone held since presentation  Fall at home, initial encounter Positive fall getting out of bed today in the setting of decompensated encephalopathy Positive facial abrasion as well as fall on the left ribs without reported head trauma or loss of consciousness. No acute trauma noted on CT imaging   --fall precautions --SNF rehab  Thrombocytopenia (HCC) Plt count in 120s  Stable   Chronic kidney disease, stage 3b (HCC) Creatinine 1.39 with GFR in the 40s  DM2 --A1c 6.8 about 3 months ago. --d/c'ed BG checks and SSI   DVT prophylaxis: Lovenox SQ Code Status: Full code  Family Communication: husband updated at bedside today Level of care: Med-Surg Dispo:   The patient is from: home Anticipated d/c is to: SNF rehab Anticipated d/c date is: whenever bed available   Subjective and Interval History:  7 recorded BM's for the past day.  Husband reported pt not eating much in the hospital.   Objective: Vitals:   06/27/23 1700 06/27/23 1956 06/28/23 0443 06/28/23 0802  BP: (!) 134/59 (!) 132/54 (!) 121/49 (!) 132/54  Pulse: 84 78 77 79  Resp: 20 16 16 18   Temp: 98.6 F (37 C) 99 F (37.2 C) 98.2 F (36.8 C) 98.7 F (37.1 C)  TempSrc: Axillary Oral  Oral  SpO2: 100% 92% 91% 95%  Weight:      Height:       No intake or output data in the 24 hours ending 06/28/23 2017  Filed Weights   06/24/23 1115  Weight: 102.1 kg    Examination:   Constitutional: NAD, AAOx3, slow to recall information HEENT: conjunctivae and lids normal, EOMI CV: No cyanosis.   RESP: normal respiratory effort Neuro: II - XII grossly intact.   Psych: Normal mood and affect.     Data Reviewed: I have personally reviewed labs and imaging studies  Time spent: 35 minutes  Darlin Priestly, MD [facility] If 7PM-7AM, please contact night-coverage 06/28/2023, 8:17  PM '

## 2023-06-28 NOTE — Plan of Care (Signed)
  Problem: Education: Goal: Ability to describe self-care measures that may prevent or decrease complications (Diabetes Survival Skills Education) will improve Outcome: Progressing Goal: Individualized Educational Video(s) Outcome: Progressing   Problem: Coping: Goal: Ability to adjust to condition or change in health will improve Outcome: Progressing   Problem: Fluid Volume: Goal: Ability to maintain a balanced intake and output will improve Outcome: Progressing   Problem: Health Behavior/Discharge Planning: Goal: Ability to identify and utilize available resources and services will improve Outcome: Progressing Goal: Ability to manage health-related needs will improve Outcome: Progressing   Problem: Metabolic: Goal: Ability to maintain appropriate glucose levels will improve Outcome: Progressing   Problem: Nutritional: Goal: Maintenance of adequate nutrition will improve Outcome: Progressing Goal: Progress toward achieving an optimal weight will improve Outcome: Progressing   Problem: Skin Integrity: Goal: Risk for impaired skin integrity will decrease Outcome: Progressing   Problem: Tissue Perfusion: Goal: Adequacy of tissue perfusion will improve Outcome: Progressing   Problem: Education: Goal: Knowledge of General Education information will improve Description: Including pain rating scale, medication(s)/side effects and non-pharmacologic comfort measures Outcome: Progressing   Problem: Health Behavior/Discharge Planning: Goal: Ability to manage health-related needs will improve Outcome: Progressing   Problem: Clinical Measurements: Goal: Ability to maintain clinical measurements within normal limits will improve Outcome: Progressing Goal: Will remain free from infection Outcome: Progressing Goal: Diagnostic test results will improve Outcome: Progressing Goal: Respiratory complications will improve Outcome: Progressing Goal: Cardiovascular complication will  be avoided Outcome: Progressing   Problem: Activity: Goal: Risk for activity intolerance will decrease Outcome: Progressing   Problem: Nutrition: Goal: Adequate nutrition will be maintained Outcome: Progressing   Problem: Coping: Goal: Level of anxiety will decrease Outcome: Progressing   Problem: Elimination: Goal: Will not experience complications related to bowel motility Outcome: Progressing Goal: Will not experience complications related to urinary retention Outcome: Progressing   Problem: Pain Management: Goal: General experience of comfort will improve Outcome: Progressing   Problem: Safety: Goal: Ability to remain free from injury will improve Outcome: Progressing   Problem: Skin Integrity: Goal: Risk for impaired skin integrity will decrease Outcome: Progressing   Problem: Education: Goal: Knowledge of General Education information will improve Description: Including pain rating scale, medication(s)/side effects and non-pharmacologic comfort measures Outcome: Progressing   Problem: Health Behavior/Discharge Planning: Goal: Ability to manage health-related needs will improve Outcome: Progressing   Problem: Clinical Measurements: Goal: Ability to maintain clinical measurements within normal limits will improve Outcome: Progressing Goal: Will remain free from infection Outcome: Progressing Goal: Diagnostic test results will improve Outcome: Progressing Goal: Respiratory complications will improve Outcome: Progressing Goal: Cardiovascular complication will be avoided Outcome: Progressing   Problem: Nutrition: Goal: Adequate nutrition will be maintained Outcome: Progressing   Problem: Coping: Goal: Level of anxiety will decrease Outcome: Progressing   Problem: Pain Management: Goal: General experience of comfort will improve Outcome: Progressing   Problem: Safety: Goal: Ability to remain free from injury will improve Outcome: Progressing    Problem: Skin Integrity: Goal: Risk for impaired skin integrity will decrease Outcome: Progressing

## 2023-06-28 NOTE — TOC Progression Note (Signed)
Transition of Care [facility]) - Progression Note    Patient Details  Name: Rachel Huber MRN: [id number] Date of Birth: 12/08/1955  Transition of Care [facility]) CM/SW Contact  Kemper Durie, RN Phone Number: 06/28/2023, 2:02 PM  Clinical Narrative:     Patient chose [facility].  TOC will continue to follow and assist with discharge when patient medically stable.   Expected Discharge Plan: Skilled Nursing Facility Barriers to Discharge: Continued Medical Work up  Expected Discharge Plan and Services                                               Social Determinants of Health (SDOH) Interventions SDOH Screenings   Food Insecurity: Patient Unable To Answer (06/25/2023)  Housing: Patient Unable To Answer (06/25/2023)  Transportation Needs: Patient Unable To Answer (06/25/2023)  Utilities: Patient Unable To Answer (06/25/2023)  Alcohol Screen: Low Risk  (04/04/2022)  Depression (PHQ2-9): Low Risk  (04/27/2023)  Financial Resource Strain: Low Risk  (06/03/2023)   Received from [facility]  Physical Activity: Inactive (04/04/2022)  Social Connections: Moderately Isolated (04/04/2022)  Stress: No Stress Concern Present (04/04/2022)  Tobacco Use: Medium Risk (06/25/2023)    Readmission Risk Interventions    04/30/2023   12:35 PM 12/19/2022   10:27 AM 11/04/2022   11:32 AM  Readmission Risk Prevention Plan  Transportation Screening Complete Complete Complete  Medication Review Oceanographer) Complete Complete Complete  PCP or Specialist appointment within 3-5 days of discharge Not Complete Complete Complete  HRI or Home Care Consult Complete Complete   SW Recovery Care/Counseling Consult Complete Complete Complete  Palliative Care Screening Not Applicable Not Applicable Not Applicable  Skilled Nursing Facility Not Complete Not Applicable Not Applicable

## 2023-06-29 ENCOUNTER — Other Ambulatory Visit: Payer: Medicare Other

## 2023-06-29 DIAGNOSIS — K7682 Hepatic encephalopathy: Secondary | ICD-10-CM | POA: Diagnosis not present

## 2023-06-29 LAB — POTASSIUM: Potassium: 4.3 mmol/L (ref 3.5–5.1)

## 2023-06-29 MED ORDER — HEPARIN SOD (PORK) LOCK FLUSH 100 UNIT/ML IV SOLN
500.0000 [IU] | Freq: Once | INTRAVENOUS | Status: AC
  Administered 2023-06-29: 500 [IU] via INTRAVENOUS
  Filled 2023-06-29: qty 5

## 2023-06-29 NOTE — Care Management Important Message (Signed)
Important Message  Patient Details  Name: Rachel Huber MRN: [id number] Date of Birth: 12-28-1955   Important Message Given:  Yes - Medicare IM     Olegario Messier A Vy Badley 06/29/2023, 11:05 AM

## 2023-06-29 NOTE — Discharge Summary (Signed)
Physician Discharge Summary   Rachel Huber  female DOB: Dec 30, 1955  XBM:[id number]  PCP: Lauro Regulus, MD  Admit date: 06/24/2023 Discharge date: 06/29/2023  Admitted From: home Disposition:  SNF rehab CODE STATUS: Full code  Discharge Instructions     Diet - low sodium heart healthy   Complete by: As directed    Discharge wound care:   Complete by: As directed    Cleanse left heel with NS and pat dry. Apply silicone foam and change every other day.  Encourage to turn and reposition to avoid prolonged pressure to sacral area. [facility] Course:  For full details, please see H&P, progress notes, consult notes and ancillary notes.  Briefly,  Rachel Huber is a 67 y.o. 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    Per report, patient fell out of bed morning of presentation.  Did have facial abrasion as well as landing on the left rib area.     Acute hepatic encephalopathy (HCC) Medication non-compliance Worsening confusion with uptrending ammonia level in the setting of baseline NASH cirrhosis.  Pt has a hx of lactulose non-compliance and prior hospitalizations for hepatic encephalopathy. Ammonia level 56-->92.  Pt received lactulose 45 g TID for 2 days which resulted in up to 7x BM's per day and much improvement in mental status.  Husband is aware of pt's non-compliance and said he will be monitoring her medicine intake from now on.   --pt discharged on home dose of lactulose 30g TID, to aim for 2-3 BM's per day. --Pt wasn't taking rifaximin  PTA, which is resumed after presentation.  Chronic Anemia getting intermittent transfusions at [facility].  No reported active bleeding currently. --s/p 2u pRBC during this hospitalization.    Liver cirrhosis secondary to NASH [facility]) home torsemide held during hospitalization, to be resumed after discharge.   Fall at home, initial encounter fall getting out of bed in the setting of decompensated encephalopathy Positive facial abrasion as well as fall on the left ribs without reported head trauma or loss of consciousness. No acute trauma noted on CT imaging  --fall precautions   Thrombocytopenia (HCC) Plt count in 120s  Stable    Chronic kidney disease, stage 3b (HCC) Creatinine 1.39 with GFR in the 40s   DM2 --A1c 6.8 about 3 months ago. --No need for BG checks and SSI  Pressure Injury 06/24/23 Heel Left Unstageable    Discharge Diagnoses:  Principal Problem:   Encephalopathy Active Problems:   Acute hepatic encephalopathy (HCC)   Fall at home, initial encounter   Liver cirrhosis secondary to NASH (HCC)   Anemia   Thrombocytopenia (HCC)   Chronic kidney disease, stage 3b (HCC)   30 Day Unplanned Readmission Risk Score    Flowsheet Row ED to Hosp-Admission (Current) from 06/24/2023 in [facility] 1C MEDICAL TELEMETRY  30 Day Unplanned Readmission Risk Score (%) 39.18 Filed at 06/29/2023 0801       This score is the patient's risk of an unplanned readmission within 30 days of being discharged (0 -100%). The score is based on dignosis, age, lab data,  medications, orders, and past utilization.   Low:  0-14.9   Medium: 15-21.9   High: 22-29.9   Extreme: 30 and above         Discharge Instructions:  Allergies as of 06/29/2023       Reactions   Latex Rash   Contact rash   Tape Other (See Comments)   Skin sensitivity   Gramineae Pollens Other (See Comments)   Sneezing, Running nose Other Reaction(s): Other (see comments)         Medication List     TAKE these medications    acetaminophen 500 MG tablet Commonly known as: TYLENOL Take 1 tablet (500 mg total) by mouth 2 (two) times daily as needed for mild pain.   butalbital-acetaminophen-caffeine 50-325-40 MG tablet Commonly known as: FIORICET Take 1 tablet by mouth every 6 (six) hours as needed for headache.   colchicine 0.6 MG tablet Take 1 tablet (0.6 mg total) by mouth 2 (two) times daily.   cyanocobalamin 500 MCG tablet Commonly known as: VITAMIN B12 Take 1,000 mcg by mouth daily.   folic acid 1 MG tablet Commonly known as: FOLVITE Take 1 tablet (1 mg total) by mouth daily.   lactulose 10 GM/15ML solution Commonly known as: CHRONULAC Take 45 mLs (30 g total) by mouth 3 (three) times daily.   multivitamin with minerals tablet Take 1 tablet by mouth daily.   sertraline 50 MG tablet Commonly known as: ZOLOFT Take 1 tablet (50 mg total) by mouth daily.   torsemide 20 MG tablet Commonly known as: DEMADEX Take 2 tablets (40 mg total) by mouth daily.   Vitamin D (Ergocalciferol) 1.25 MG (50000 UNIT) Caps capsule Commonly known as: DRISDOL Take 1 capsule (50,000 Units total) by mouth once a week.   Xifaxan 550 MG Tabs tablet Generic drug: rifaximin Take 1 tablet (550 mg total) by mouth 2 (two) times daily.               Discharge Care Instructions  (From admission, onward)           Start     Ordered   06/29/23 0000  Discharge wound care:       Comments: Cleanse left heel with NS and pat dry. Apply silicone foam and change every other day.  Encourage to turn and reposition to avoid prolonged pressure to sacral area. - -   06/29/23 1027             Contact information for follow-up providers     Lauro Regulus, MD Follow up.   Specialty: Internal Medicine Why: hospital follow up Contact information: [address] [facility] [place] [address] [phone]              Contact  information for after-discharge care     Destination     [facility] .   Service: Skilled Nursing Contact information: [address] [place] [address] [phone]                     Allergies  Allergen Reactions   Latex Rash    Contact rash   Tape Other (See Comments)    Skin sensitivity   Gramineae Pollens Other (See Comments)    Sneezing, Running nose  Other Reaction(s): Other (see comments)     The results of significant diagnostics from this hospitalization (including imaging, microbiology, ancillary and laboratory) are listed below for reference.   Consultations:  Procedures/Studies: CT CHEST ABDOMEN PELVIS WO CONTRAST  Result Date: 06/24/2023 CLINICAL DATA:  Polytrauma, blunt EXAM: CT CHEST, ABDOMEN AND PELVIS WITHOUT CONTRAST TECHNIQUE: Multidetector CT imaging of the chest, abdomen and pelvis was performed following the standard protocol without IV contrast. RADIATION DOSE REDUCTION: This exam was performed according to the departmental dose-optimization program which includes automated exposure control, adjustment of the mA and/or kV according to patient size and/or use of iterative reconstruction technique. COMPARISON:  CT scan abdomen and pelvis from 03/18/2023. FINDINGS: CT CHEST FINDINGS Cardiovascular: Normal cardiac size. No pericardial effusion. No aortic aneurysm. There are coronary artery calcifications, in keeping with coronary artery disease. Mild mitral annulus calcifications noted. There is apparent hypoattenuation of the blood pool relative to the myocardium, suggestive of anemia. Mediastinum/Nodes: Visualized thyroid gland appears grossly unremarkable. No solid / cystic mediastinal masses. The esophagus is nondistended precluding optimal assessment. No axillary, mediastinal or hilar lymphadenopathy by size criteria. Lungs/Pleura: The central tracheo-bronchial tree is patent. There is small left pleural effusion  with associated compressive atelectatic changes in the left lung lower lobe. There are patchy areas of linear, plate-like atelectasis and/or scarring throughout bilateral lungs. No mass or consolidation. No right pleural effusion. No suspicious lung nodules. Musculoskeletal: A CT Port-a-Cath is seen in the right upper chest wall with the catheter terminating in the cavo-atrial junction region. Visualized soft tissues of the chest wall are otherwise grossly unremarkable. No suspicious osseous lesions. There are mild multilevel degenerative changes in the visualized spine. CT ABDOMEN PELVIS FINDINGS Hepatobiliary: The liver is normal in size. Cirrhotic configuration. There is a TIPSS shunt. No suspicious mass. No intrahepatic or extrahepatic bile duct dilation. Partially distended gallbladder nearly completely filled with hyperattenuating sludge and calculi without imaging signs of acute cholecystitis. Normal gallbladder wall thickness. No pericholecystic inflammatory changes. Pancreas: Unremarkable. No pancreatic ductal dilatation or surrounding inflammatory changes. Spleen: Redemonstration of mildly enlarged spleen with peripheral hypoattenuating areas/splenic infarct along the inferolateral aspect, similar to the prior study. Adrenals/Urinary Tract: Adrenal glands are unremarkable. No suspicious renal mass. No hydronephrosis. There are at least 2, sub 4 mm nonobstructing calculi in the left kidney and at least 1, 2 mm nonobstructing calculus in the right kidney. No ureteric calculi. Unremarkable urinary bladder. Stomach/Bowel: No disproportionate dilation of the small or large bowel loops. No evidence of abnormal bowel wall thickening or inflammatory changes. The appendix was not visualized; however there is no acute inflammatory process in the right lower quadrant. Vascular/Lymphatic: There is moderate ascites. No pneumoperitoneum. No abdominal or pelvic lymphadenopathy, by size criteria. No aneurysmal dilation of  the major abdominal arteries. There are mild peripheral atherosclerotic vascular calcifications of the aorta and its major branches. Reproductive: The uterus is surgically absent. No large adnexal mass. Other: There is a small ascitic fluid containing umbilical hernia. The soft tissues and abdominal wall are otherwise unremarkable. Musculoskeletal: No suspicious osseous lesions. There are mild - moderate multilevel degenerative changes in the visualized spine. IMPRESSION: *No traumatic injury to the chest, abdomen or pelvis. *Multiple other nonacute observations (such as left pleural effusion, anemia, cirrhotic liver, TIPSS, splenic infarctions, cholelithiasis without acute cholecystitis, moderate ascites, bilateral nonobstructing renal calculi, etc.), as described above. Electronically Signed   By: Jules Schick M.D.   On: 06/24/2023 15:10   DG Ribs Unilateral W/Chest Left  Result Date: 06/24/2023 CLINICAL DATA:  Left rib pain after fall. EXAM: LEFT RIBS AND CHEST - 3+ VIEW COMPARISON:  April 29, 2023. FINDINGS: No fracture or other bone lesions are seen involving  the ribs. There is no evidence of pneumothorax. Mild left basilar atelectasis is noted with small left pleural effusion. Heart size and mediastinal contours are within normal limits. IMPRESSION: No definite abnormality seen involving the left ribs. Mild left basilar atelectasis with small left pleural effusion. Electronically Signed   By: Lupita Raider M.D.   On: 06/24/2023 13:29   [place] ASCITES (ABDOMEN LIMITED)  Result Date: 06/09/2023 INDICATION: Patient with history of cirrhosis with recurrent ascites. Status post tips and tips revision x2 at outside hospital and partial splenic embolization. Patient presents for therapeutic paracentesis. EXAM: ULTRASOUND ABDOMEN LIMITED FINDINGS: Imaging of all 4 quadrants of the abdomen reveal no significant volume of ascites. IMPRESSION: No significant volume ascites appreciated by ultrasound. No  paracentesis performed. Electronically Signed   By: Olive Bass M.D.   On: 06/09/2023 11:09      Labs: BNP (last 3 results) Recent Labs    11/17/22 0114  BNP 302.0*   Basic Metabolic Panel: Recent Labs  Lab 06/24/23 1237 06/25/23 0556 06/26/23 0541 06/27/23 0626 06/28/23 0520 06/29/23 0440  NA 135 140 141 137 135  --   K 3.3* 3.6 3.0* 3.2* 3.4* 4.3  CL 110 114* 116* 112* 114*  --   CO2 18* 20* 18* 19* 19*  --   GLUCOSE 186* 91 155* 115* 143*  --   BUN 37* 34* 33* 30* 31*  --   CREATININE 1.39* 1.31* 1.26* 1.26* 1.30*  --   CALCIUM 8.4* 8.7* 8.7* 8.5* 8.2*  --   MG  --   --  1.5* 2.1 2.0  --    Liver Function Tests: Recent Labs  Lab 06/24/23 1237 06/25/23 0556  AST 36 36  ALT 20 19  ALKPHOS 82 87  BILITOT 1.6* 2.2*  PROT 5.6* 5.9*  ALBUMIN 2.8* 2.8*   No results for input(s): "LIPASE", "AMYLASE" in the last 168 hours. Recent Labs  Lab 06/24/23 1237 06/25/23 0556 06/26/23 0541  AMMONIA 92* 98* 69*   CBC: Recent Labs  Lab 06/24/23 1237 06/24/23 2217 06/25/23 0556 06/26/23 0541 06/27/23 0626 06/28/23 0520  WBC 4.3  --  4.2 7.6 7.8 5.3  NEUTROABS 2.1  --   --   --   --   --   HGB 6.5* 7.8* 7.7* 8.0* 7.1* 7.7*  HCT 21.7* 25.6* 25.1* 25.7* 22.9* 24.6*  MCV 74.3*  --  73.4* 73.2* 72.9* 74.8*  PLT 128*  --  133* 139* 136* 122*   Cardiac Enzymes: No results for input(s): "CKTOTAL", "CKMB", "CKMBINDEX", "TROPONINI" in the last 168 hours. BNP: Invalid input(s): "POCBNP" CBG: Recent Labs  Lab 06/25/23 1606 06/26/23 0133 06/26/23 0731 06/26/23 1146 06/26/23 1537  GLUCAP 119* 134* 142* 123* 161*   D-Dimer No results for input(s): "DDIMER" in the last 72 hours. Hgb A1c No results for input(s): "HGBA1C" in the last 72 hours. Lipid Profile No results for input(s): "CHOL", "HDL", "LDLCALC", "TRIG", "CHOLHDL", "LDLDIRECT" in the last 72 hours. Thyroid function studies No results for input(s): "TSH", "T4TOTAL", "T3FREE", "THYROIDAB" in the last 72  hours.  Invalid input(s): "FREET3" Anemia work up No results for input(s): "VITAMINB12", "FOLATE", "FERRITIN", "TIBC", "IRON", "RETICCTPCT" in the last 72 hours. Urinalysis    Component Value Date/Time   COLORURINE YELLOW (A) 06/24/2023 2212   APPEARANCEUR HAZY (A) 06/24/2023 2212   LABSPEC 1.011 06/24/2023 2212   PHURINE 5.0 06/24/2023 2212   GLUCOSEU NEGATIVE 06/24/2023 2212   HGBUR NEGATIVE 06/24/2023 2212   BILIRUBINUR NEGATIVE 06/24/2023 2212  KETONESUR NEGATIVE 06/24/2023 2212   PROTEINUR NEGATIVE 06/24/2023 2212   NITRITE NEGATIVE 06/24/2023 2212   LEUKOCYTESUR TRACE (A) 06/24/2023 2212   Sepsis Labs Recent Labs  Lab 06/25/23 0556 06/26/23 0541 06/27/23 0626 06/28/23 0520  WBC 4.2 7.6 7.8 5.3   Microbiology No results found for this or any previous visit (from the past 240 hour(s)).   Total time spend on discharging this patient, including the last patient exam, discussing the hospital stay, instructions for ongoing care as it relates to all pertinent caregivers, as well as preparing the medical discharge records, prescriptions, and/or referrals as applicable, is 35 minutes.    Darlin Priestly, MD  [facility] 06/29/2023, 11:24 AM

## 2023-06-29 NOTE — TOC Transition Note (Addendum)
Transition of Care [facility]) - CM/SW Discharge Note   Patient Details  Name: Rachel Huber MRN: [id number] Date of Birth: May 05, 1956  [facility] of [facility]) CM/SW Contact:  Allena Katz, LCSW Phone Number: 06/29/2023, 10:10 AM   Clinical Narrative:   Pt has orders to discharge to [facility] room 10B. RN given number for report. Son to transport her.  CSW to send dc summary once in.     Final next level of care: Skilled Nursing Facility Barriers to Discharge: Barriers Resolved   Patient Goals and CMS Choice CMS Medicare.gov Compare Post Acute Care list provided to:: Patient Choice offered to / list presented to : Patient  Discharge Placement                Patient [facility] bed at: [facility] Patient to be transferred to facility by: [facility]   Patient and family notified of of transfer: 06/29/23  Discharge Plan and Services Additional resources added to the After Visit Summary for                                       Social Determinants of Health (SDOH) Interventions SDOH Screenings   Food Insecurity: Patient Unable To Answer (06/25/2023)  Housing: Patient Unable To Answer (06/25/2023)  Transportation Needs: Patient Unable To Answer (06/25/2023)  Utilities: Patient Unable To Answer (06/25/2023)  Alcohol Screen: Low Risk  (04/04/2022)  Depression (PHQ2-9): Low Risk  (04/27/2023)  Financial Resource Strain: Low Risk  (06/03/2023)   Received from [facility]  Physical Activity: Inactive (04/04/2022)  Social Connections: Moderately Isolated (04/04/2022)  Stress: No Stress Concern Present (04/04/2022)  Tobacco Use: Medium Risk (06/25/2023)     Readmission Risk Interventions    04/30/2023   12:35 PM 12/19/2022   10:27 AM 11/04/2022   11:32 AM  Readmission Risk Prevention Plan  Transportation Screening Complete Complete Complete  Medication Review Oceanographer) Complete Complete Complete  PCP or Specialist appointment  within 3-5 days of discharge Not Complete Complete Complete  HRI or Home Care Consult Complete Complete   SW Recovery Care/Counseling Consult Complete Complete Complete  Palliative Care Screening Not Applicable Not Applicable Not Applicable  Skilled Nursing Facility Not Complete Not Applicable Not Applicable

## 2023-06-29 NOTE — Progress Notes (Signed)
Report called to [facility], Statistician, all questions answered.  AVS given to son for transport

## 2023-06-30 ENCOUNTER — Encounter (HOSPITAL_COMMUNITY): Payer: Medicare Other | Admitting: Physical Therapy

## 2023-06-30 ENCOUNTER — Encounter (HOSPITAL_COMMUNITY): Payer: Self-pay | Admitting: Physical Therapy

## 2023-06-30 NOTE — Therapy (Unsigned)
PHYSICAL THERAPY DISCHARGE SUMMARY  Visits from Start of Care: 1  Current functional level related to goals / functional outcomes: Not met ended up going to SNF   Remaining deficits: weakness   Education / Equipment: HEP   Patient agrees to discharge. Patient goals were not met. Patient is being discharged due to a change in medical status. Virgina Organ, PT CLT [phone]

## 2023-07-03 ENCOUNTER — Encounter (HOSPITAL_COMMUNITY): Payer: Medicare Other | Admitting: Physical Therapy

## 2023-07-03 ENCOUNTER — Encounter (HOSPITAL_COMMUNITY): Payer: Medicare Other | Admitting: Occupational Therapy

## 2023-07-07 ENCOUNTER — Inpatient Hospital Stay: Payer: Medicare Other

## 2023-07-07 ENCOUNTER — Other Ambulatory Visit: Payer: Self-pay

## 2023-07-07 ENCOUNTER — Ambulatory Visit: Payer: Medicare Other | Admitting: Hematology

## 2023-07-07 DIAGNOSIS — D561 Beta thalassemia: Secondary | ICD-10-CM

## 2023-07-07 DIAGNOSIS — D500 Iron deficiency anemia secondary to blood loss (chronic): Secondary | ICD-10-CM

## 2023-07-07 NOTE — Progress Notes (Signed)
Lab orders placed per previous MD note

## 2023-07-09 ENCOUNTER — Encounter (HOSPITAL_COMMUNITY): Payer: Medicare Other | Admitting: Physical Therapy

## 2023-07-10 ENCOUNTER — Encounter (HOSPITAL_COMMUNITY): Payer: Medicare Other | Admitting: Physical Therapy

## 2023-07-10 ENCOUNTER — Encounter (HOSPITAL_COMMUNITY): Payer: Medicare Other | Admitting: Occupational Therapy

## 2023-07-13 ENCOUNTER — Inpatient Hospital Stay: Payer: Medicare Other

## 2023-07-13 ENCOUNTER — Encounter (HOSPITAL_COMMUNITY): Payer: Medicare Other | Admitting: Physical Therapy

## 2023-07-15 ENCOUNTER — Encounter (HOSPITAL_COMMUNITY): Payer: Medicare Other | Admitting: Physical Therapy

## 2023-07-20 ENCOUNTER — Inpatient Hospital Stay: Payer: Medicare Other

## 2023-07-22 ENCOUNTER — Other Ambulatory Visit: Payer: Self-pay

## 2023-07-22 ENCOUNTER — Emergency Department
Admission: EM | Admit: 2023-07-22 | Discharge: 2023-07-22 | Disposition: A | Discharge status: Home / Self Care | Payer: Medicare Other | Attending: Emergency Medicine | Admitting: Emergency Medicine

## 2023-07-22 ENCOUNTER — Encounter: Payer: Self-pay | Admitting: Intensive Care

## 2023-07-22 ENCOUNTER — Encounter: Payer: Self-pay | Admitting: Hematology

## 2023-07-22 DIAGNOSIS — K7682 Hepatic encephalopathy: Secondary | ICD-10-CM | POA: Insufficient documentation

## 2023-07-22 DIAGNOSIS — E1122 Type 2 diabetes mellitus with diabetic chronic kidney disease: Secondary | ICD-10-CM | POA: Diagnosis not present

## 2023-07-22 DIAGNOSIS — N189 Chronic kidney disease, unspecified: Secondary | ICD-10-CM | POA: Insufficient documentation

## 2023-07-22 LAB — COMPREHENSIVE METABOLIC PANEL
ALT: 17 U/L (ref 0–44)
AST: 40 U/L (ref 15–41)
Albumin: 3.2 g/dL — ABNORMAL LOW (ref 3.5–5.0)
Alkaline Phosphatase: 98 U/L (ref 38–126)
Anion gap: 13 (ref 5–15)
BUN: 31 mg/dL — ABNORMAL HIGH (ref 8–23)
CO2: 26 mmol/L (ref 22–32)
Calcium: 9 mg/dL (ref 8.9–10.3)
Chloride: 96 mmol/L — ABNORMAL LOW (ref 98–111)
Creatinine, Ser: 1.47 mg/dL — ABNORMAL HIGH (ref 0.44–1.00)
GFR, Estimated: 39 mL/min — ABNORMAL LOW (ref >60)
Glucose, Bld: 212 mg/dL — ABNORMAL HIGH (ref 70–99)
Potassium: 3.1 mmol/L — ABNORMAL LOW (ref 3.5–5.1)
Sodium: 135 mmol/L (ref 135–145)
Total Bilirubin: 4.4 mg/dL — ABNORMAL HIGH (ref <1.2)
Total Protein: 6.7 g/dL (ref 6.5–8.1)

## 2023-07-22 LAB — CBC WITH DIFFERENTIAL/PLATELET
Abs Immature Granulocytes: 0.04 10*3/uL (ref 0.00–0.07)
Basophils Absolute: 0.1 10*3/uL (ref 0.0–0.1)
Basophils Relative: 1 %
Eosinophils Absolute: 0.2 10*3/uL (ref 0.0–0.5)
Eosinophils Relative: 3 %
HCT: 29.3 % — ABNORMAL LOW (ref 36.0–46.0)
Hemoglobin: 9.1 g/dL — ABNORMAL LOW (ref 12.0–15.0)
Immature Granulocytes: 1 %
Lymphocytes Relative: 17 %
Lymphs Abs: 1.3 10*3/uL (ref 0.7–4.0)
MCH: 22.8 pg — ABNORMAL LOW (ref 26.0–34.0)
MCHC: 31.1 g/dL (ref 30.0–36.0)
MCV: 73.3 fL — ABNORMAL LOW (ref 80.0–100.0)
Monocytes Absolute: 0.5 10*3/uL (ref 0.1–1.0)
Monocytes Relative: 7 %
Neutro Abs: 5.9 10*3/uL (ref 1.7–7.7)
Neutrophils Relative %: 71 %
Platelets: 173 10*3/uL (ref 150–400)
RBC: 4 MIL/uL (ref 3.87–5.11)
RDW: 20.9 % — ABNORMAL HIGH (ref 11.5–15.5)
Smear Review: NORMAL
WBC: 8 10*3/uL (ref 4.0–10.5)
nRBC: 0.2 % (ref 0.0–0.2)

## 2023-07-22 LAB — PATHOLOGIST SMEAR REVIEW

## 2023-07-22 LAB — AMMONIA: Ammonia: 84 umol/L — ABNORMAL HIGH (ref 9–35)

## 2023-07-22 MED ORDER — LACTULOSE 10 GM/15ML PO SOLN
10.0000 g | Freq: Once | ORAL | Status: AC
  Administered 2023-07-22: 10 g via ORAL

## 2023-07-22 MED ORDER — LACTULOSE 10 GM/15ML PO SOLN: 45.0000 g | Freq: Three times a day (TID) | ORAL | 1 refills | Status: DC

## 2023-07-22 MED ORDER — ONDANSETRON 4 MG PO TBDP
4.0000 mg | ORAL_TABLET | Freq: Once | ORAL | Status: AC
  Administered 2023-07-22: 4 mg via ORAL
  Filled 2023-07-22: qty 1

## 2023-07-22 MED ORDER — LACTULOSE 10 GM/15ML PO SOLN
30.0000 g | Freq: Once | ORAL | Status: AC
  Administered 2023-07-22: 30 g via ORAL
  Filled 2023-07-22: qty 60

## 2023-07-22 MED ORDER — POTASSIUM CHLORIDE CRYS ER 20 MEQ PO TBCR
40.0000 meq | EXTENDED_RELEASE_TABLET | Freq: Once | ORAL | Status: AC
  Administered 2023-07-22: 40 meq via ORAL
  Filled 2023-07-22: qty 2

## 2023-07-22 NOTE — ED Triage Notes (Signed)
First Nurse Note;  Pt via [facility] from home. Pt here for abnormal labs, ammonia levels and total bilirubin elevated. Pt does have a hx of liver disease. Pt is A&Ox4 and NAD. VSS

## 2023-07-22 NOTE — ED Notes (Signed)
See triage notes. Patient c/o elevated ammonia.

## 2023-07-22 NOTE — ED Triage Notes (Signed)
Patient arrived by EMS from [employer]. Brought in for abnormal ammonia levels and bilirubin elevated. History of liver disease.   Reports she did not recently take her lactulose.   A&O x4

## 2023-07-22 NOTE — ED Notes (Signed)
Three attempts made to contact [facility] at [phone] without success. Pt and husband educated on discharge instructions and verbalized understanding.

## 2023-07-22 NOTE — ED Provider Notes (Signed)
[facility] Provider Note    Event Date/Time   First MD Initiated Contact with Patient 07/22/23 1640     (approximate)   History   Chief Complaint Abnormal Lab   HPI  Rachel Huber is a 67 y.o. female with past medical history of diabetes, CKD, NASH cirrhosis status post TIPS, and hepatic encephalopathy who presents to the ED for abnormal labs.  Husband reports that patient has been more tired and sleepy lately at her rehab facility.  He states that she had similar symptoms when admitted to the hospital for hepatic encephalopathy last month.  He states that she has been taking lactulose as prescribed, but was taking a higher dose while in the hospital.  She did reportedly miss a dose of lactulose earlier today due to being asleep.  Husband reports that patient appears more awake and alert here in the hospital.  Patient currently denies any complaints however labs at her nursing facility reportedly showed elevated ammonia and bilirubin.     Physical Exam   Triage Vital Signs: ED Triage Vitals [07/22/23 1429]  Encounter Vitals Group     BP (!) 97/53     Systolic BP Percentile      Diastolic BP Percentile      Pulse Rate 92     Resp 16     Temp 98.5 F (36.9 C)     Temp Source Oral     SpO2 93 %     Weight 298 lb (135.2 kg)     Height 5\' 9"  (1.753 m)     Head Circumference      Peak Flow      Pain Score 0     Pain Loc      Pain Education      Exclude from Growth Chart     Most recent vital signs: Vitals:   07/22/23 1429  BP: (!) 97/53  Pulse: 92  Resp: 16  Temp: 98.5 F (36.9 C)  SpO2: 93%    Constitutional: Alert and oriented to person, place, time, and situation.  Mild jaundice noted. Eyes: Conjunctivae are normal. Head: Atraumatic. Nose: No congestion/rhinnorhea. Mouth/Throat: Mucous membranes are moist.  Cardiovascular: Normal rate, regular rhythm. Grossly normal heart sounds.  2+ radial pulses bilaterally. Respiratory: Normal  respiratory effort.  No retractions. Lungs CTAB. Gastrointestinal: Soft and nontender. No distention. Musculoskeletal: No lower extremity tenderness nor edema.  Neurologic:  Normal speech and language. No gross focal neurologic deficits are appreciated.  No asterixis noted.    ED Results / Procedures / Treatments   Labs (all labs ordered are listed, but only abnormal results are displayed) Labs Reviewed  AMMONIA - Abnormal; Notable for the following components:      Result Value   Ammonia 84 (*)    All other components within normal limits  CBC WITH DIFFERENTIAL/PLATELET - Abnormal; Notable for the following components:   Hemoglobin 9.1 (*)    HCT 29.3 (*)    MCV 73.3 (*)    MCH 22.8 (*)    RDW 20.9 (*)    All other components within normal limits  COMPREHENSIVE METABOLIC PANEL - Abnormal; Notable for the following components:   Potassium 3.1 (*)    Chloride 96 (*)    Glucose, Bld 212 (*)    BUN 31 (*)    Creatinine, Ser 1.47 (*)    Albumin 3.2 (*)    Total Bilirubin 4.4 (*)    GFR, Estimated 39 (*)  All other components within normal limits  PATHOLOGIST SMEAR REVIEW     EKG  ED ECG REPORT I, Chesley Noon, the attending physician, personally viewed and interpreted this ECG.   Date: 07/22/2023  EKG Time: 17:16  Rate: 85  Rhythm: normal sinus rhythm  Axis: Normal  Intervals:none  ST&T Change: Nonspecific T wave abnormality  PROCEDURES:  Critical Care performed: No  Procedures   MEDICATIONS ORDERED IN ED: Medications  lactulose (CHRONULAC) 10 GM/15ML solution 30 g (30 g Oral Given 07/22/23 1719)  potassium chloride SA (KLOR-CON M) CR tablet 40 mEq (40 mEq Oral Given 07/22/23 1737)  lactulose (CHRONULAC) 10 GM/15ML solution 10 g (10 g Oral Given 07/22/23 1723)  ondansetron (ZOFRAN-ODT) disintegrating tablet 4 mg (4 mg Oral Given 07/22/23 1749)     IMPRESSION / MDM / ASSESSMENT AND PLAN / ED COURSE  I reviewed the triage vital signs and the nursing  notes.                              67 y.o. female with past medical history of diabetes, CKD, [name] cirrhosis status post TIPS, and hepatic encephalopathy who presents to the ED for fatigue and somnolence at her nursing facility for the past couple of days.  Patient's presentation is most consistent with acute presentation with potential threat to life or bodily function.  Differential diagnosis includes, but is not limited to, hepatic encephalopathy, electrolyte abnormality, AKI, UTI, anemia.  Patient chronically ill but nontoxic-appearing and in no acute distress, vital signs remarkable for borderline low BP but otherwise reassuring.  Patient is A&O x 4 at the time of my assessment, husband does report that she still seems slightly slower to respond to questions, but no asterixis noted.  Labs show anemia improved compared to previous, no significant leukocytosis.  Renal function stable compared to previous with mild hypokalemia which we will replete.  Bilirubin slightly increased compared to previous but LFTs otherwise unremarkable, ammonia also mildly elevated.  It does appear she was receiving a higher dose of lactulose while in the hospital, mental status had improved following that dosing before she was sent back home on her 30 g 3 times daily dosing of lactulose.  She also missed a dose earlier today, we will give dose of lactulose and reassess.  Patient continues to be A&O x 4 on reassessment and given reassuring workup, she is appropriate for discharge home with outpatient management.  We will increase her lactulose dosing to 45 g 3 times daily for the next few days, patient and husband counseled to have her return to the ED for new or worsening symptoms.  Patient and husband agree with plan.      FINAL CLINICAL IMPRESSION(S) / ED DIAGNOSES   Final diagnoses:  Hepatic encephalopathy (HCC)     Rx / DC Orders   ED Discharge Orders          Ordered    lactulose (CHRONULAC) 10  GM/15ML solution  3 times daily        07/22/23 1940             Note:  This document was prepared using Dragon voice recognition software and may include unintentional dictation errors.   Chesley Noon, MD 07/22/23 [phone]

## 2023-07-28 ENCOUNTER — Other Ambulatory Visit: Payer: Self-pay

## 2023-07-28 ENCOUNTER — Inpatient Hospital Stay: Payer: Medicare Other

## 2023-07-28 ENCOUNTER — Encounter: Payer: Self-pay | Admitting: Emergency Medicine

## 2023-07-28 ENCOUNTER — Inpatient Hospital Stay
Admission: EM | Admit: 2023-07-28 | Discharge: 2023-09-04 | DRG: 981 | Disposition: A | Discharge status: Hospice | Payer: Medicare Other | Attending: Internal Medicine | Admitting: Internal Medicine

## 2023-07-28 DIAGNOSIS — Z515 Encounter for palliative care: Secondary | ICD-10-CM

## 2023-07-28 DIAGNOSIS — Z1152 Encounter for screening for COVID-19: Secondary | ICD-10-CM | POA: Diagnosis not present

## 2023-07-28 DIAGNOSIS — Z90710 Acquired absence of both cervix and uterus: Secondary | ICD-10-CM

## 2023-07-28 DIAGNOSIS — F418 Other specified anxiety disorders: Secondary | ICD-10-CM | POA: Diagnosis not present

## 2023-07-28 DIAGNOSIS — D561 Beta thalassemia: Secondary | ICD-10-CM | POA: Diagnosis present

## 2023-07-28 DIAGNOSIS — E872 Acidosis, unspecified: Secondary | ICD-10-CM | POA: Diagnosis present

## 2023-07-28 DIAGNOSIS — R296 Repeated falls: Secondary | ICD-10-CM | POA: Diagnosis present

## 2023-07-28 DIAGNOSIS — N179 Acute kidney failure, unspecified: Secondary | ICD-10-CM | POA: Diagnosis present

## 2023-07-28 DIAGNOSIS — K219 Gastro-esophageal reflux disease without esophagitis: Secondary | ICD-10-CM | POA: Diagnosis present

## 2023-07-28 DIAGNOSIS — I129 Hypertensive chronic kidney disease with stage 1 through stage 4 chronic kidney disease, or unspecified chronic kidney disease: Secondary | ICD-10-CM | POA: Diagnosis present

## 2023-07-28 DIAGNOSIS — Z6841 Body Mass Index (BMI) 40.0 and over, adult: Secondary | ICD-10-CM | POA: Diagnosis not present

## 2023-07-28 DIAGNOSIS — Z91199 Patient's noncompliance with other medical treatment and regimen due to unspecified reason: Secondary | ICD-10-CM

## 2023-07-28 DIAGNOSIS — E870 Hyperosmolality and hypernatremia: Secondary | ICD-10-CM | POA: Diagnosis present

## 2023-07-28 DIAGNOSIS — Z91040 Latex allergy status: Secondary | ICD-10-CM

## 2023-07-28 DIAGNOSIS — Z8249 Family history of ischemic heart disease and other diseases of the circulatory system: Secondary | ICD-10-CM

## 2023-07-28 DIAGNOSIS — R4182 Altered mental status, unspecified: Secondary | ICD-10-CM | POA: Diagnosis not present

## 2023-07-28 DIAGNOSIS — E1169 Type 2 diabetes mellitus with other specified complication: Secondary | ICD-10-CM | POA: Diagnosis not present

## 2023-07-28 DIAGNOSIS — Z7682 Awaiting organ transplant status: Secondary | ICD-10-CM

## 2023-07-28 DIAGNOSIS — K7682 Hepatic encephalopathy: Principal | ICD-10-CM | POA: Diagnosis present

## 2023-07-28 DIAGNOSIS — S72002A Fracture of unspecified part of neck of left femur, initial encounter for closed fracture: Secondary | ICD-10-CM | POA: Diagnosis not present

## 2023-07-28 DIAGNOSIS — S72142S Displaced intertrochanteric fracture of left femur, sequela: Secondary | ICD-10-CM

## 2023-07-28 DIAGNOSIS — Z8616 Personal history of COVID-19: Secondary | ICD-10-CM | POA: Diagnosis not present

## 2023-07-28 DIAGNOSIS — G9341 Metabolic encephalopathy: Secondary | ICD-10-CM | POA: Diagnosis present

## 2023-07-28 DIAGNOSIS — F32A Depression, unspecified: Secondary | ICD-10-CM | POA: Diagnosis present

## 2023-07-28 DIAGNOSIS — D631 Anemia in chronic kidney disease: Secondary | ICD-10-CM | POA: Diagnosis present

## 2023-07-28 DIAGNOSIS — Z91048 Other nonmedicinal substance allergy status: Secondary | ICD-10-CM

## 2023-07-28 DIAGNOSIS — Z9842 Cataract extraction status, left eye: Secondary | ICD-10-CM

## 2023-07-28 DIAGNOSIS — N1832 Chronic kidney disease, stage 3b: Secondary | ICD-10-CM | POA: Diagnosis present

## 2023-07-28 DIAGNOSIS — J302 Other seasonal allergic rhinitis: Secondary | ICD-10-CM | POA: Diagnosis present

## 2023-07-28 DIAGNOSIS — Z961 Presence of intraocular lens: Secondary | ICD-10-CM | POA: Diagnosis present

## 2023-07-28 DIAGNOSIS — Z7984 Long term (current) use of oral hypoglycemic drugs: Secondary | ICD-10-CM

## 2023-07-28 DIAGNOSIS — E876 Hypokalemia: Secondary | ICD-10-CM | POA: Diagnosis not present

## 2023-07-28 DIAGNOSIS — K766 Portal hypertension: Secondary | ICD-10-CM | POA: Diagnosis present

## 2023-07-28 DIAGNOSIS — E860 Dehydration: Secondary | ICD-10-CM | POA: Diagnosis present

## 2023-07-28 DIAGNOSIS — I700 Atherosclerosis of aorta: Secondary | ICD-10-CM | POA: Diagnosis present

## 2023-07-28 DIAGNOSIS — E871 Hypo-osmolality and hyponatremia: Secondary | ICD-10-CM | POA: Diagnosis present

## 2023-07-28 DIAGNOSIS — E1165 Type 2 diabetes mellitus with hyperglycemia: Secondary | ICD-10-CM | POA: Diagnosis present

## 2023-07-28 DIAGNOSIS — S51811A Laceration without foreign body of right forearm, initial encounter: Secondary | ICD-10-CM | POA: Diagnosis not present

## 2023-07-28 DIAGNOSIS — Z79899 Other long term (current) drug therapy: Secondary | ICD-10-CM

## 2023-07-28 DIAGNOSIS — E785 Hyperlipidemia, unspecified: Secondary | ICD-10-CM | POA: Diagnosis present

## 2023-07-28 DIAGNOSIS — E1122 Type 2 diabetes mellitus with diabetic chronic kidney disease: Secondary | ICD-10-CM | POA: Diagnosis present

## 2023-07-28 DIAGNOSIS — Z9841 Cataract extraction status, right eye: Secondary | ICD-10-CM

## 2023-07-28 DIAGNOSIS — F419 Anxiety disorder, unspecified: Secondary | ICD-10-CM | POA: Diagnosis present

## 2023-07-28 DIAGNOSIS — K7581 Nonalcoholic steatohepatitis (NASH): Secondary | ICD-10-CM | POA: Diagnosis present

## 2023-07-28 DIAGNOSIS — W06XXXA Fall from bed, initial encounter: Secondary | ICD-10-CM | POA: Diagnosis not present

## 2023-07-28 DIAGNOSIS — R17 Unspecified jaundice: Secondary | ICD-10-CM | POA: Diagnosis not present

## 2023-07-28 DIAGNOSIS — K7460 Unspecified cirrhosis of liver: Secondary | ICD-10-CM | POA: Diagnosis present

## 2023-07-28 DIAGNOSIS — Z96611 Presence of right artificial shoulder joint: Secondary | ICD-10-CM | POA: Diagnosis present

## 2023-07-28 DIAGNOSIS — G4733 Obstructive sleep apnea (adult) (pediatric): Secondary | ICD-10-CM

## 2023-07-28 DIAGNOSIS — Y92239 Unspecified place in hospital as the place of occurrence of the external cause: Secondary | ICD-10-CM | POA: Diagnosis not present

## 2023-07-28 DIAGNOSIS — K7469 Other cirrhosis of liver: Secondary | ICD-10-CM | POA: Diagnosis not present

## 2023-07-28 DIAGNOSIS — D696 Thrombocytopenia, unspecified: Secondary | ICD-10-CM | POA: Diagnosis present

## 2023-07-28 DIAGNOSIS — Z833 Family history of diabetes mellitus: Secondary | ICD-10-CM

## 2023-07-28 DIAGNOSIS — D329 Benign neoplasm of meninges, unspecified: Secondary | ICD-10-CM | POA: Diagnosis present

## 2023-07-28 DIAGNOSIS — E1140 Type 2 diabetes mellitus with diabetic neuropathy, unspecified: Secondary | ICD-10-CM | POA: Diagnosis present

## 2023-07-28 DIAGNOSIS — Z87891 Personal history of nicotine dependence: Secondary | ICD-10-CM

## 2023-07-28 DIAGNOSIS — E66813 Obesity, class 3: Secondary | ICD-10-CM | POA: Insufficient documentation

## 2023-07-28 DIAGNOSIS — Z7189 Other specified counseling: Secondary | ICD-10-CM | POA: Diagnosis not present

## 2023-07-28 DIAGNOSIS — S72142A Displaced intertrochanteric fracture of left femur, initial encounter for closed fracture: Secondary | ICD-10-CM | POA: Diagnosis not present

## 2023-07-28 DIAGNOSIS — N189 Chronic kidney disease, unspecified: Secondary | ICD-10-CM | POA: Diagnosis present

## 2023-07-28 DIAGNOSIS — M810 Age-related osteoporosis without current pathological fracture: Secondary | ICD-10-CM | POA: Diagnosis present

## 2023-07-28 DIAGNOSIS — Z91148 Patient's other noncompliance with medication regimen for other reason: Secondary | ICD-10-CM

## 2023-07-28 LAB — COMPREHENSIVE METABOLIC PANEL
ALT: 20 U/L (ref 0–44)
AST: 44 U/L — ABNORMAL HIGH (ref 15–41)
Albumin: 3.2 g/dL — ABNORMAL LOW (ref 3.5–5.0)
Alkaline Phosphatase: 105 U/L (ref 38–126)
Anion gap: 12 (ref 5–15)
BUN: 36 mg/dL — ABNORMAL HIGH (ref 8–23)
CO2: 27 mmol/L (ref 22–32)
Calcium: 9 mg/dL (ref 8.9–10.3)
Chloride: 95 mmol/L — ABNORMAL LOW (ref 98–111)
Creatinine, Ser: 1.26 mg/dL — ABNORMAL HIGH (ref 0.44–1.00)
GFR, Estimated: 47 mL/min — ABNORMAL LOW (ref >60)
Glucose, Bld: 194 mg/dL — ABNORMAL HIGH (ref 70–99)
Potassium: 3.3 mmol/L — ABNORMAL LOW (ref 3.5–5.1)
Sodium: 134 mmol/L — ABNORMAL LOW (ref 135–145)
Total Bilirubin: 3.9 mg/dL — ABNORMAL HIGH (ref <1.2)
Total Protein: 6.6 g/dL (ref 6.5–8.1)

## 2023-07-28 LAB — CBC
HCT: 25.9 % — ABNORMAL LOW (ref 36.0–46.0)
Hemoglobin: 8 g/dL — ABNORMAL LOW (ref 12.0–15.0)
MCH: 22.7 pg — ABNORMAL LOW (ref 26.0–34.0)
MCHC: 30.9 g/dL (ref 30.0–36.0)
MCV: 73.6 fL — ABNORMAL LOW (ref 80.0–100.0)
Platelets: 172 10*3/uL (ref 150–400)
RBC: 3.52 MIL/uL — ABNORMAL LOW (ref 3.87–5.11)
RDW: 20.1 % — ABNORMAL HIGH (ref 11.5–15.5)
WBC: 5.4 10*3/uL (ref 4.0–10.5)
nRBC: 0.4 % — ABNORMAL HIGH (ref 0.0–0.2)

## 2023-07-28 LAB — MAGNESIUM: Magnesium: 1.5 mg/dL — ABNORMAL LOW (ref 1.7–2.4)

## 2023-07-28 LAB — LIPASE, BLOOD: Lipase: 36 U/L (ref 11–51)

## 2023-07-28 LAB — AMMONIA: Ammonia: 120 umol/L — ABNORMAL HIGH (ref 9–35)

## 2023-07-28 MED ORDER — LACTULOSE 10 GM/15ML PO SOLN
30.0000 g | Freq: Three times a day (TID) | ORAL | Status: DC
Start: 2023-07-28 — End: 2023-08-07
  Administered 2023-07-28 – 2023-08-07 (×24): 30 g via ORAL
  Filled 2023-07-28 (×26): qty 60

## 2023-07-28 MED ORDER — POTASSIUM CHLORIDE 10 MEQ/100ML IV SOLN
10.0000 meq | INTRAVENOUS | Status: AC
  Administered 2023-07-28 (×2): 10 meq via INTRAVENOUS
  Filled 2023-07-28: qty 100

## 2023-07-28 MED ORDER — ONDANSETRON HCL 4 MG PO TABS: 4.0000 mg | ORAL_TABLET | Freq: Four times a day (QID) | ORAL | Status: AC | PRN

## 2023-07-28 MED ORDER — ACETAMINOPHEN 650 MG RE SUPP
650.0000 mg | Freq: Four times a day (QID) | RECTAL | Status: AC | PRN
Start: 2023-07-28 — End: 2023-08-02

## 2023-07-28 MED ORDER — LACTULOSE 10 GM/15ML PO SOLN
30.0000 g | Freq: Once | ORAL | Status: AC
  Administered 2023-07-28: 30 g via ORAL
  Filled 2023-07-28: qty 60

## 2023-07-28 MED ORDER — BUTALBITAL-APAP-CAFFEINE 50-325-40 MG PO TABS
1.0000 | ORAL_TABLET | Freq: Four times a day (QID) | ORAL | Status: DC | PRN
  Administered 2023-08-07: 1 via ORAL
  Filled 2023-07-28 (×2): qty 1

## 2023-07-28 MED ORDER — ONDANSETRON HCL 4 MG/2ML IJ SOLN
4.0000 mg | Freq: Four times a day (QID) | INTRAMUSCULAR | Status: AC | PRN
Start: 2023-07-28 — End: 2023-08-02
  Administered 2023-08-01: 4 mg via INTRAVENOUS
  Filled 2023-07-28: qty 2

## 2023-07-28 MED ORDER — VITAMIN B-12 1000 MCG PO TABS
1000.0000 ug | ORAL_TABLET | Freq: Every day | ORAL | Status: DC
Start: 2023-07-29 — End: 2023-08-09
  Administered 2023-07-29 – 2023-08-09 (×9): 1000 ug via ORAL
  Filled 2023-07-28 (×8): qty 1
  Filled 2023-07-28: qty 2
  Filled 2023-07-28 (×2): qty 1

## 2023-07-28 MED ORDER — MAGNESIUM SULFATE IN D5W 1-5 GM/100ML-% IV SOLN
1.0000 g | Freq: Once | INTRAVENOUS | Status: AC
  Administered 2023-07-28: 1 g via INTRAVENOUS
  Filled 2023-07-28: qty 100

## 2023-07-28 MED ORDER — HEPARIN SODIUM (PORCINE) 5000 UNIT/ML IJ SOLN
5000.0000 [IU] | Freq: Three times a day (TID) | INTRAMUSCULAR | Status: DC
  Administered 2023-07-28 – 2023-08-17 (×57): 5000 [IU] via SUBCUTANEOUS
  Filled 2023-07-28 (×57): qty 1

## 2023-07-28 MED ORDER — FOLIC ACID 1 MG PO TABS
1.0000 mg | ORAL_TABLET | Freq: Every day | ORAL | Status: DC
Start: 2023-07-29 — End: 2023-08-07
  Administered 2023-07-29 – 2023-08-05 (×7): 1 mg via ORAL
  Filled 2023-07-28 (×9): qty 1

## 2023-07-28 MED ORDER — ACETAMINOPHEN 325 MG PO TABS
650.0000 mg | ORAL_TABLET | Freq: Four times a day (QID) | ORAL | Status: AC | PRN
  Filled 2023-07-28 (×2): qty 2

## 2023-07-28 MED ORDER — RIFAXIMIN 550 MG PO TABS
550.0000 mg | ORAL_TABLET | Freq: Two times a day (BID) | ORAL | Status: DC
  Administered 2023-07-28 – 2023-08-09 (×20): 550 mg via ORAL
  Filled 2023-07-28 (×24): qty 1

## 2023-07-28 NOTE — ED Provider Notes (Signed)
[facility] Provider Note    Event Date/Time   First MD Initiated Contact with Patient 07/28/23 1712     (approximate)  History   Chief Complaint: Altered mental status/confusion  HPI  Rachel Huber is a 67 y.o. female with a past medical history of anemia, CKD, cirrhosis, hypertension, status post TIPS procedure, presents to the emergency department for altered mental status.  Patient is coming from [facility] by EMS for confusion noted today.  Per EMS they state the nursing home staff reports patient has been refusing her lactulose.  I reviewed the patient's chart she was last discharged from the hospital 06/29/2023 after a similar admission for altered mental status due to hepatic encephalopathy.  Patient was seen in the emergency department 12//24 for increased fatigue.  Here the patient is awake and alert she is very slow to respond to questioning unable to tell me where she is or the year did say her first name.  Physical Exam   Triage Vital Signs: ED Triage Vitals  Encounter Vitals Group     BP 07/28/23 1713 (!) 113/58     Systolic BP Percentile --      Diastolic BP Percentile --      Pulse Rate 07/28/23 1713 87     Resp 07/28/23 1713 15     Temp 07/28/23 1713 99.8 F (37.7 C)     Temp Source 07/28/23 1713 Oral     SpO2 07/28/23 1713 97 %     Weight 07/28/23 1710 298 lb (135.2 kg)     Height 07/28/23 1710 5\' 9"  (1.753 m)     Head Circumference --      Peak Flow --      Pain Score 07/28/23 1710 0     Pain Loc --      Pain Education --      Exclude from Growth Chart --     Most recent vital signs: Vitals:   07/28/23 1713  BP: (!) 113/58  Pulse: 87  Resp: 15  Temp: 99.8 F (37.7 C)  SpO2: 97%    General: Patient keeps her eyes closed but is awake well attempt to answer questions at times but appears quite confused.  Oriented times person only.  Jaundiced in appearance CV:  Good peripheral perfusion.  Regular rate and rhythm   Resp:  Normal effort.  Equal breath sounds bilaterally.  Abd:  No distention.  Soft, nontender.  No rebound or guarding.  ED Results / Procedures / Treatments   MEDICATIONS ORDERED IN ED: Medications - No data to display   IMPRESSION / MDM / ASSESSMENT AND PLAN / ED COURSE  I reviewed the triage vital signs and the nursing notes.  Patient's presentation is most consistent with acute presentation with potential threat to life or bodily function.  Patient presents emergency department for altered mental status.  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  Per EMS report nursing home staff stated the patient has been refusing her lactulose.  Highly suspect hepatic encephalopathy to be the cause of the patient's presentation today.  We will check broad labs including an ammonia level we will also check a urine sample and continue to closely monitor while awaiting results.    Patient CBC shows chronic anemia no significant finding.  Chemistry shows no significant finding compared to her baseline.  Ammonia is elevated to 120.  Patient able to tolerate oral lactulose.  Will admit to the hospital  service for hepatic encephalopathy.  FINAL CLINICAL IMPRESSION(S) / ED DIAGNOSES   Altered mental status Hepatic encephalopathy  Note:  This document was prepared using Dragon voice recognition software and may include unintentional dictation errors.   Minna Antis, MD 07/28/23 [phone]

## 2023-07-28 NOTE — H&P (Addendum)
History and Physical   JUBILEE SCHNECK ZOX:[id number] DOB: [birth year] DOA: 07/28/2023  PCP: Lauro Regulus, MD  Outpatient Specialists: Dr. Norwood Levo [facility]  Patient coming from: [facility] via EMS  I have personally briefly reviewed patient's old medical records in [facility] EMR.  Chief Concern: Altered mental status  HPI: 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  Vitals in the ED showed temperature of 98.7, respiration rate of 15, heart rate 84, blood pressure 124/54, SpO2 of 99% on room air.  Serum sodium is 134, potassium 3.3, chloride 94, bicarb 27, BUN of 36, serum creatinine 1.26, EGFR 47, nonfasting blood glucose 194, WBC 5.4, hemoglobin 8.0, platelets of 172.  T bilirubin was elevated at 3.9 which actually improved from a value of 4.4 6 days ago.  Alk phos was within normal limits.  AST mildly elevated at 44.  Ammonia level was markedly elevated at 120.  EKG on admission showed sinus rhythm with rate of 85, QTc 539  ED treatment: Lactulose 30 g p.o. one-time dose. ----------------------------- At bedside, patient able to tell me her first and last name.  She promptly goes back to sleep.  Patient has scleral icterus and is jaundiced.  Social history: Patient is from [facility] facility.  ROS: Unable to complete as patient currently has hepatic encephalopathy  ED Course: Discussed with EDP, patient requiring hospitalization for chief concerns of altered mental  status concerning for hepatic encephalopathy.  Assessment/Plan  Principal Problem:   Hepatic encephalopathy (HCC) Active Problems:   Chronic kidney disease, stage 3b (HCC)   HLD (hyperlipidemia)   Depression with anxiety   Type 2 diabetes mellitus with hyperlipidemia (HCC)   OSA on CPAP   Anemia in chronic kidney disease (CKD)   Hypokalemia   AMS (altered mental status)   Aortic atherosclerosis (HCC)   Beta thalassemia intermedia (HCC)   Obesity, Class III, BMI 40-49.9 (morbid obesity) (HCC)   Hypomagnesemia   Assessment and Plan:  * Hepatic encephalopathy (HCC) Suspect secondary to patient refusal to take lactulose at nursing facility Status post lactulose 30 g p.o. one-time dose ordered by EDP Resumed home rifaximin 550 mg p.o. twice daily on admission Strict I's and O's, especially BM.  If patient does not have a BM, will proceed with rectal lactulose Admit to telemetry medical, inpatient  Chronic kidney disease, stage 3b (HCC) With possible CKD 3A as well Patient is currently at baseline  Hypomagnesemia Magnesium replaced on admission, recheck magnesium level in the a.m.  Obesity, Class III, BMI 40-49.9 (morbid obesity) (HCC) This complicates overall care and prognosis.   AMS (altered mental status) With history of frequent falls in setting of lactulose noncompliance Fall precautions, aspiration precaution  Hypokalemia Potassium chloride 10 mill equivalent IV, 2 doses ordered on admission Recheck BMP in a.m.  Anemia in chronic kidney disease (CKD) Currently no clinical concerns for bleeding at this time Patient's baseline range over the last month has been 7.1-9.1 No indication for transfusion at this time Continue outpatient intermittent transfusion [facility]  OSA on CPAP CPAP nightly ordered on admission  Chart reviewed.   Hospitalization from 06/24/2023 to 06/29/2023: Patient was admitted for decompensated encephalopathy, acute on chronic anemia  and a fall.  Patient was treated with lactulose 45 g 3 times daily, for 2 days with up to 7x bowel movements per day improvement of mental status.  In addition patient was not taking rifaximin.  Patient received 2 units of PRBC transfusion during this hospitalization.  DVT prophylaxis: Heparin 5000 units subcutaneous every 8 hours Code Status: Full code Diet: N.p.o. except for sips of meds and ice chips Family Communication: Attempted to call patient's spouse at [phone], no pickup Disposition Plan: Pending clinical course; guarded prognosis Consults called: None at this time Admission status: telemetry medical, inpatient  Past Medical History:  Diagnosis Date   Anemia in chronic kidney disease (CKD) 02/07/2022   Anxiety    Arthritis    Ascites of liver    Weekly paracentesis   Cirrhosis of liver (HCC)    CKD (chronic kidney disease) stage 4, GFR 15-29 ml/min (HCC)    GERD (gastroesophageal reflux disease)    Grade IV internal hemorrhoids    Hypertension    NASH (nonalcoholic steatohepatitis)    Neuropathy, diabetic (HCC)    Obesity (BMI 30-39.9)    OSA on CPAP    Pneumonia    Portal hypertension (HCC)    S/P TIPS (transjugular intrahepatic portosystemic shunt)    Thalassemia minor 1992   Past Surgical History:  Procedure Laterality Date   ABDOMINAL HYSTERECTOMY     BREAST BIOPSY Right 02/15/2018   [place] bx 6-6:30 ribbon shape, ONE CORE FRAGMENT WITH FIBROSIS. ONE CORE FRAGMENT WITH PORTION OF A DILATED   BREAST BIOPSY Right 02/15/2018   [place] bx 9:00 heart shape, USUAL DUCTAL HYPERPLASIA   BREAST LUMPECTOMY Right 03/09/2018   Procedure: BREAST LUMPECTOMY x 2;  Surgeon: Sung Amabile, DO;  Location: [facility] ORS;  Service: General;  Laterality: Right;   CATARACT EXTRACTION W/PHACO Left 06/11/2016   Procedure: CATARACT EXTRACTION PHACO AND INTRAOCULAR LENS PLACEMENT (IOC);  Surgeon: Sallee Lange, MD;  Location: [facility] ORS;  Service: Ophthalmology;  Laterality: Left;  Lot #  [id number] H US:01:38.6 AP%:26.4 CDE:44.15   CATARACT EXTRACTION W/PHACO Right 06/15/2018   Procedure: CATARACT EXTRACTION PHACO AND INTRAOCULAR LENS PLACEMENT (IOC);  Surgeon: Galen Manila, MD;  Location: [facility] ORS;  Service: Ophthalmology;  Laterality: Right;  [place] 00:38.2 CDE 4.23 Fluid Pack Lot # [id number]   COLONOSCOPIES     COLONOSCOPY WITH PROPOFOL N/A 10/10/2020   Procedure: COLONOSCOPY WITH PROPOFOL;  Surgeon: Regis Bill, MD;  Location: [facility] ENDOSCOPY;  Service: Endoscopy;  Laterality: N/A;   COLONOSCOPY WITH PROPOFOL N/A 11/20/2020   Procedure: COLONOSCOPY WITH PROPOFOL;  Surgeon: Regis Bill, MD;  Location: [facility] ENDOSCOPY;  Service: Endoscopy;  Laterality: N/A;  DM STAT CBC, BMP COVID POSITIVE 09/02/2020   COLONOSCOPY WITH PROPOFOL N/A 06/19/2022   Procedure: COLONOSCOPY WITH PROPOFOL;  Surgeon: Regis Bill, MD;  Location: [facility] ENDOSCOPY;  Service: Endoscopy;  Laterality: N/A;   CTR     ESOPHAGOGASTRODUODENOSCOPY (EGD) WITH PROPOFOL N/A 10/10/2020   Procedure: ESOPHAGOGASTRODUODENOSCOPY (EGD) WITH PROPOFOL;  Surgeon: Regis Bill, MD;  Location: [facility] ENDOSCOPY;  Service: Endoscopy;  Laterality: N/A;  COVID POSITIVE 10/08/2020   FLEXIBLE SIGMOIDOSCOPY N/A 02/06/2022   Procedure: FLEXIBLE SIGMOIDOSCOPY;  Surgeon: Regis Bill, MD;  Location: [facility] ENDOSCOPY;  Service: Endoscopy;  Laterality: N/A;  Patient requests anesthesia   FLEXIBLE SIGMOIDOSCOPY N/A 12/19/2022   Procedure: FLEXIBLE SIGMOIDOSCOPY;  Surgeon: Dolores Frame, MD;  [facility] END[facility];  Service: Gastroenterology;  Laterality: N/A;   IR ANGIOGRAM SELECTIVE EACH ADDITIONAL VESSEL  02/07/2022   IR ANGIOGRAM SELECTIVE EACH ADDITIONAL VESSEL  02/07/2022   IR ANGIOGRAM SELECTIVE EACH ADDITIONAL VESSEL  09/05/2022   IR ANGIOGRAM VISCERAL SELECTIVE  02/07/2022   IR ANGIOGRAM VISCERAL SELECTIVE  09/05/2022   IR CV LINE INJECTION  02/24/2023   IR EMBO ART  VEN HEMORR LYMPH EXTRAV  INC  GUIDE ROADMAPPING  09/05/2022   IR EMBO ART  VEN HEMORR LYMPH EXTRAV  INC GUIDE ROADMAPPING  11/05/2022   IR EMBO ARTERIAL NOT HEMORR HEMANG INC GUIDE ROADMAPPING  02/07/2022   IR EMBO TUMOR ORGAN ISCHEMIA INFARCT INC GUIDE ROADMAPPING  01/06/2023   IR IMAGING GUIDED PORT INSERTION  06/13/2022   IR IMAGING GUIDED PORT INSERTION  11/25/2022   IR PARACENTESIS  12/17/2021   IR PARACENTESIS  03/25/2022   IR PARACENTESIS  11/25/2022   IR PARACENTESIS  01/06/2023   IR PARACENTESIS  01/19/2023   IR RADIOLOGIST EVAL & MGMT  03/18/2022   IR RADIOLOGIST EVAL & MGMT  05/07/2022   IR RADIOLOGIST EVAL & MGMT  05/16/2022   IR RADIOLOGIST EVAL & MGMT  10/13/2022   IR RADIOLOGIST EVAL & MGMT  12/12/2022   IR RADIOLOGIST EVAL & MGMT  03/30/2023   IR US GUIDE VASC ACCESS RIGHT  02/07/2022   IR US GUIDE VASC ACCESS RIGHT  09/05/2022   JOINT REPLACEMENT     KNEE SURGERY Right 09/24/2017   plates and pins   PORT-A-CATH REMOVAL N/A 09/23/2022   Procedure: MINOR REMOVAL PORT-A-CATH;  Surgeon: Franky Macho, MD;  Location: [facility] ORS;  Service: General;  Laterality: N/A;   TEE WITHOUT CARDIOVERSION N/A 09/23/2022   Procedure: TRANSESOPHAGEAL ECHOCARDIOGRAM (TEE);  Surgeon: Antoine Poche, MD;  Location: [facility] ORS;  Service: Endoscopy;  Laterality: N/A;   TOTAL SHOULDER ARTHROPLASTY Right 09/27/2015   Social History:  reports that she has quit smoking. Her smoking use included cigarettes. She has never used smokeless tobacco. She reports that she does not drink alcohol and does not use drugs.  Allergies  Allergen Reactions   Latex Rash    Contact rash   Tape Other (See Comments)    Skin sensitivity   Gramineae Pollens Other (See Comments)    Sneezing, Running nose  Other Reaction(s): Other (see comments)   Family History  Problem Relation Age of Onset   Breast cancer Mother 3   Lymphoma Mother    Diabetes Father    Kidney cancer Father    Heart disease Father    Diabetes Sister    Breast cancer Sister 44   Family  history: Family history reviewed and not pertinent.  Prior to Admission medications   Medication Sig Start Date End Date Taking? Authorizing Provider  acetaminophen (TYLENOL) 500 MG tablet Take 1 tablet (500 mg total) by mouth 2 (two) times daily as needed for mild pain. 01/26/23   Angiulli, Mcarthur Rossetti, PA-C  butalbital-acetaminophen-caffeine (FIORICET) [phone] MG tablet Take 1 tablet by mouth every 6 (six) hours as needed for headache. 01/26/23   [provider]  colchicine 0.6 MG tablet Take 1 tablet (0.6 mg total) by mouth 2 (two) times daily. 05/22/23   Sharee Holster, NP  cyanocobalamin (VITAMIN B12) 500 MCG tablet Take 1,000 mcg by mouth daily. 06/28/21   [provider]  folic acid (FOLVITE) 1 MG tablet Take 1 tablet (1 mg total) by mouth daily. 05/25/23   Doreatha Massed, MD  lactulose (CHRONULAC) 10 GM/15ML solution Take 45 mLs (30 g total) by mouth 3 (three) times daily. 05/22/23   Sharee Holster, NP  lactulose (CHRONULAC) 10 GM/15ML solution Take 67.5 mLs (45 g total) by mouth 3 (three) times daily for 9 days. 07/22/23 07/31/23  Chesley Noon, MD  Multiple Vitamins-Minerals (MULTIVITAMIN WITH MINERALS) tablet Take 1 tablet by mouth daily.    [provider]  sertraline (ZOLOFT) 50 MG tablet Take 1 tablet (50 mg total) by mouth daily. 05/22/23   Sharee Holster, NP  torsemide (DEMADEX) 20 MG tablet Take 2 tablets (40 mg total) by mouth daily. 05/22/23   Sharee Holster, NP  Vitamin D, Ergocalciferol, (DRISDOL) 1.25 MG (50000 UNIT) CAPS capsule Take 1 capsule (50,000 Units total) by mouth once a week. 05/22/23   Sharee Holster, NP  XIFAXAN 550 MG TABS tablet Take 1 tablet (550 mg total) by mouth 2 (two) times daily. Patient not taking: Reported on 06/24/2023 05/22/23   Sharee Holster, NP   Physical Exam: Vitals:   07/28/23 1907 07/28/23 1930 07/28/23 2000 07/28/23 2030  BP:  (!) 119/49 (!) 113/55 (!) 120/56  Pulse: 84 83 79 80  Resp: 15 19 18 17    Temp: 98.7 F (37.1 C)     TempSrc: Oral     SpO2: 99% 95% 96% 96%  Weight:      Height:       Constitutional: appears older than chronological age, acutely ill and altered, and lethargic Eyes: PERRL, lids and conjunctivae normal.  Bilateral scleral icterus ENMT: Mucous membranes are dry.  Unable to assess posterior pharynx . Age-appropriate dentition.  Unable to assess hearing Neck: normal, supple, no masses, no thyromegaly Respiratory: clear to auscultation bilaterally, no wheezing, no crackles. Normal respiratory effort. No accessory muscle use.  Cardiovascular: Regular rate and rhythm, no murmurs / rubs / gallops. No extremity edema. 2+ pedal pulses. No carotid bruits.  Abdomen: no tenderness, no masses palpated, no hepatosplenomegaly. Bowel sounds positive.  Musculoskeletal: no clubbing / cyanosis. No joint deformity upper and lower extremities. Good ROM, no contractures, no atrophy. Normal muscle tone.  Skin: no rashes, lesions, ulcers. No induration.  Generalized jaundice Neurologic: Sensation intact. Strength 5/5 in all 4.  Psychiatric: Unable to assess judgment and insight.  Patient arousable and able to tell me her first and last name only.  Unable to assess mood.   EKG: independently reviewed, showing sinus rhythm with rate of 85, QTc 439  Chest x-ray on Admission: Ordered and pending completion  Labs on Admission: I have personally reviewed following labs  CBC: Recent Labs  Lab 07/22/23 1436 07/28/23 1721  WBC 8.0 5.4  NEUTROABS 5.9  --   HGB 9.1* 8.0*  HCT 29.3* 25.9*  MCV 73.3* 73.6*  PLT 173 172   Basic Metabolic Panel: Recent Labs  Lab 07/22/23 1436 07/28/23 1720 07/28/23 1721  NA 135  --  134*  K 3.1*  --  3.3*  CL 96*  --  95*  CO2 26  --  27  GLUCOSE 212*  --  194*  BUN 31*  --  36*  CREATININE 1.47*  --  1.26*  CALCIUM 9.0  --  9.0  MG  --  1.5*  --    GFR: Estimated Creatinine Clearance: 65.9 mL/min (A) (by C-G formula based on SCr of 1.26  mg/dL (H)).  Liver Function Tests: Recent Labs  Lab 07/22/23 1436 07/28/23 1721  AST 40 44*  ALT 17 20  ALKPHOS 98 105  BILITOT 4.4* 3.9*  PROT 6.7 6.6  ALBUMIN 3.2* 3.2*   Recent Labs  Lab 07/28/23 1721  LIPASE 36   Recent Labs  Lab 07/22/23 1436 07/28/23 1721  AMMONIA 84* 120*   Urine analysis:    Component Value Date/Time   COLORURINE YELLOW (A) 06/24/2023 2212   APPEARANCEUR HAZY (A) 06/24/2023 2212   LABSPEC 1.011 06/24/2023 2212   PHURINE 5.0 06/24/2023 2212   GLUCOSEU NEGATIVE 06/24/2023 2212   HGBUR NEGATIVE 06/24/2023 2212   BILIRUBINUR NEGATIVE 06/24/2023 2212   KETONESUR NEGATIVE 06/24/2023 2212   PROTEINUR NEGATIVE 06/24/2023 2212   NITRITE NEGATIVE 06/24/2023 2212   LEUKOCYTESUR TRACE (A) 06/24/2023 2212   This document was prepared using Dragon Voice Recognition software and may include unintentional dictation errors.  Dr. Sedalia Muta [facility]  If 7PM-7AM, please contact overnight-coverage provider If 7AM-7PM, please contact day attending provider [email or web address]  07/28/2023, 9:33 PM

## 2023-07-28 NOTE — Assessment & Plan Note (Signed)
Potassium chloride 10 mill equivalent IV, 2 doses ordered on admission Recheck BMP in a.m.

## 2023-07-28 NOTE — ED Notes (Signed)
18g piv to rfa remains cdi secured patent and not infiltrated @ this time

## 2023-07-28 NOTE — ED Notes (Signed)
Added mag order onto blood collected

## 2023-07-28 NOTE — Assessment & Plan Note (Signed)
 CPAP nightly ordered on admission

## 2023-07-28 NOTE — Assessment & Plan Note (Addendum)
With possible CKD 3A as well Patient is currently at baseline

## 2023-07-28 NOTE — Assessment & Plan Note (Addendum)
Suspect secondary to patient refusal to take lactulose at nursing facility Status post lactulose 30 g p.o. one-time dose ordered by EDP Resumed home rifaximin 550 mg p.o. twice daily on admission Strict I's and O's, especially BM.  If patient does not have a BM, will proceed with rectal lactulose Admit to telemetry medical, inpatient

## 2023-07-28 NOTE — Assessment & Plan Note (Signed)
-   This complicates overall care and prognosis.  

## 2023-07-28 NOTE — Hospital Course (Addendum)
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  Patient was diagnosed with hepatic encephalopathy, was given lactulose and IV fluids.  Patient mental status has been waxing and waning.  Patient had a fall while in the hospital, sustained a new left intertrochanteric hip fracture, had intertrochanteric nail performed on 12/19. 12/21.  Patient became unresponsive, ammonia level went up to more than 400, NG tube was placed, patient was given lactulose.  Received 2 units of PRBC. 12/22.  Patient became more responsive today, still very confused. 12/23: Patient remained quite lethargic, not following any commands and appears significantly dry with sodium of 152-started on D5 with hypotonic saline.  High risk for refeeding syndrome so close monitoring of electrolytes required.  Hemoglobin at 7.3, anemia panel consistent with anemia of chronic disease.  Having bowel movements.  High risk for deterioration and mortality-palliative care is on board but currently patient remained full code.  12/24: Patient remained pretty much unresponsive, minimum response to painful stimuli, worsening hypernatremia and hyperglycemia since she was started on tube feed.  Increasing free water, D5 and adding SSI. Family remain very optimistic for her full recovery and want everything to be done.  12/25: No change in clinical status.  Slight improvement in sodium to 153, CBG elevated, continuing D5 and increasing the dose of NovoLog to 6 unit, although increasing free  water intake through NG tube.  12/26; patient open eyes and make a very short eye contact when called her name today.  Not following any other commands.  Hypernatremia resolved.  Stopping IV fluid and we will continue with free water during tube feed. Hemoglobin decreased to 6.7 so ordered 1 unit of PRBC.  Worsening thrombocytopenia.  CBG remained elevated but now we are stopping D5 hopefully that will improve.  12/27: Labs and vital stable, no change in mental status, remained on tube feed.  Started conversation regarding PEG placement if she has to remain full scope of care.  12/28: No significant change in mental status.  Family seems interested in PEG tube placement.  Hemoglobin decreased to 6.7-ordered 1 unit of PRBC.  12/29: Hemoglobin improved to 7.1 after getting 1 unit of PRBC.  Again developing hypernatremia-increasing the frequency of free water through NG tube.  Lengthy discussion with husband along with palliative care at bedside, he was asking for PEG tube.  We discussed the logistics so he agreed to check her benefit for LTC as he will not be able to take care of her at home.  Patient likely have permanent damage at this point as there is no change in her encephalopathy.  Husband is also going to discuss with their children and other family member and then let us know about their plan.  12/30: Hemodynamically stable but remained encephalopathic with no change in mental status.  Another discussion with family with palliative care and they decided to transition her to full comfort care. Comfort measures initiated and hospice consult was placed.  Family wants her to go to inpatient hospice facility.  12/31: Hemodynamically stable, sudden change in mental status and now more alert and answering some questions.  Following commands.  Recognizing family member and wants to eat something. Restarted lactulose, we will observe for another day and if continue to improve then family might revoked  full comfort care status.

## 2023-07-28 NOTE — Assessment & Plan Note (Signed)
Magnesium replaced on admission, recheck magnesium level in the a.m.

## 2023-07-28 NOTE — ED Triage Notes (Signed)
Pt here back from [employer] brought in dor aloc , pt still not taking her lactulose was here for same 6 days ago

## 2023-07-28 NOTE — Assessment & Plan Note (Signed)
With history of frequent falls in setting of lactulose noncompliance Fall precautions, aspiration precaution

## 2023-07-28 NOTE — Assessment & Plan Note (Addendum)
Currently no clinical concerns for bleeding at this time Patient's baseline range over the last month has been 7.1-9.1 No indication for transfusion at this time Continue outpatient intermittent transfusion [facility]

## 2023-07-29 ENCOUNTER — Inpatient Hospital Stay: Payer: Medicare Other | Admitting: Hematology

## 2023-07-29 ENCOUNTER — Inpatient Hospital Stay: Payer: Medicare Other | Attending: Hematology

## 2023-07-29 ENCOUNTER — Inpatient Hospital Stay: Payer: Medicare Other

## 2023-07-29 DIAGNOSIS — D561 Beta thalassemia: Secondary | ICD-10-CM

## 2023-07-29 DIAGNOSIS — K7682 Hepatic encephalopathy: Secondary | ICD-10-CM | POA: Diagnosis not present

## 2023-07-29 LAB — BASIC METABOLIC PANEL
Anion gap: 8 (ref 5–15)
BUN: 34 mg/dL — ABNORMAL HIGH (ref 8–23)
CO2: 26 mmol/L (ref 22–32)
Calcium: 8.7 mg/dL — ABNORMAL LOW (ref 8.9–10.3)
Chloride: 102 mmol/L (ref 98–111)
Creatinine, Ser: 1.15 mg/dL — ABNORMAL HIGH (ref 0.44–1.00)
GFR, Estimated: 53 mL/min — ABNORMAL LOW (ref >60)
Glucose, Bld: 125 mg/dL — ABNORMAL HIGH (ref 70–99)
Potassium: 2.9 mmol/L — ABNORMAL LOW (ref 3.5–5.1)
Sodium: 136 mmol/L (ref 135–145)

## 2023-07-29 LAB — MAGNESIUM: Magnesium: 1.6 mg/dL — ABNORMAL LOW (ref 1.7–2.4)

## 2023-07-29 LAB — URINALYSIS, COMPLETE (UACMP) WITH MICROSCOPIC
Bilirubin Urine: NEGATIVE
Glucose, UA: NEGATIVE mg/dL
Ketones, ur: NEGATIVE mg/dL
Nitrite: NEGATIVE
Protein, ur: NEGATIVE mg/dL
Specific Gravity, Urine: 1.01 (ref 1.005–1.030)
WBC, UA: >50 WBC/hpf (ref 0–5)
pH: 7 (ref 5.0–8.0)

## 2023-07-29 LAB — CBC
HCT: 25.1 % — ABNORMAL LOW (ref 36.0–46.0)
Hemoglobin: 7.6 g/dL — ABNORMAL LOW (ref 12.0–15.0)
MCH: 22.5 pg — ABNORMAL LOW (ref 26.0–34.0)
MCHC: 30.3 g/dL (ref 30.0–36.0)
MCV: 74.3 fL — ABNORMAL LOW (ref 80.0–100.0)
Platelets: 167 10*3/uL (ref 150–400)
RBC: 3.38 MIL/uL — ABNORMAL LOW (ref 3.87–5.11)
RDW: 20.3 % — ABNORMAL HIGH (ref 11.5–15.5)
WBC: 4.7 10*3/uL (ref 4.0–10.5)
nRBC: 0.6 % — ABNORMAL HIGH (ref 0.0–0.2)

## 2023-07-29 LAB — HEPATIC FUNCTION PANEL
ALT: 19 U/L (ref 0–44)
AST: 40 U/L (ref 15–41)
Albumin: 2.9 g/dL — ABNORMAL LOW (ref 3.5–5.0)
Alkaline Phosphatase: 91 U/L (ref 38–126)
Bilirubin, Direct: 0.8 mg/dL — ABNORMAL HIGH (ref 0.0–0.2)
Indirect Bilirubin: 2.7 mg/dL — ABNORMAL HIGH (ref 0.3–0.9)
Total Bilirubin: 3.5 mg/dL — ABNORMAL HIGH (ref <1.2)
Total Protein: 5.9 g/dL — ABNORMAL LOW (ref 6.5–8.1)

## 2023-07-29 MED ORDER — MAGNESIUM SULFATE 2 GM/50ML IV SOLN
2.0000 g | Freq: Once | INTRAVENOUS | Status: AC
  Administered 2023-07-29: 2 g via INTRAVENOUS
  Filled 2023-07-29: qty 50

## 2023-07-29 MED ORDER — POTASSIUM CHLORIDE CRYS ER 20 MEQ PO TBCR
40.0000 meq | EXTENDED_RELEASE_TABLET | Freq: Once | ORAL | Status: AC
  Administered 2023-07-29: 40 meq via ORAL
  Filled 2023-07-29: qty 2

## 2023-07-29 MED ORDER — POTASSIUM CHLORIDE 10 MEQ/100ML IV SOLN
10.0000 meq | INTRAVENOUS | Status: AC
  Administered 2023-07-29 (×4): 10 meq via INTRAVENOUS
  Filled 2023-07-29: qty 100

## 2023-07-29 NOTE — Progress Notes (Signed)
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    Assessment & Plan:   Principal Problem:   Hepatic encephalopathy (HCC) Active Problems:   Chronic kidney disease, stage 3b (HCC)   HLD (hyperlipidemia)   Depression with anxiety   Type 2 diabetes mellitus with hyperlipidemia (HCC)   OSA on CPAP   Anemia in chronic kidney disease (CKD)   Hypokalemia   AMS (altered mental status)   Aortic atherosclerosis (HCC)   Beta thalassemia intermedia (HCC)   Obesity, Class III, BMI 40-49.9 (morbid obesity) (HCC)   Hypomagnesemia   Hepatic encephalopathy (HCC) Decompensated NASH cirrhosis Suspect secondary to patient refusal to take lactulose at nursing facility Status post lactulose 30 g p.o. one-time dose ordered by EDP Plan: Lactulose 30 g 3 times daily If unable to tolerate p.o. will need to do lactulose enema Continue home rifaximin Monitor mentation  Chronic kidney disease, stage 3b (HCC) Patient is currently at baseline renal function   Hypomagnesemia Monitor and replace as necessary   Obesity, Class III, BMI 40-49.9 (morbid obesity) (HCC) This complicates overall care and prognosis.    AMS (altered mental status) With history of frequent falls in setting of lactulose noncompliance Fall precautions, aspiration precaution   Hypokalemia Monitor and replace as necessary   Anemia in chronic kidney disease  (CKD) Currently no clinical concerns for bleeding at this time Patient's baseline range over the last month has been 7.1-9.1 No indication for transfusion at this time Continue outpatient intermittent transfusion [facility]   OSA on CPAP CPAP nightly ordered on admission   DVT prophylaxis: Heparin subcu Code Status: Full Family Communication: None Disposition Plan: Status is: Inpatient Remains inpatient appropriate because: Hepatic encephalopathy, decompensated hepatic cirrhosis   Level of care: Telemetry Medical  Consultants:  None  Procedures:  None  Antimicrobials: None   Subjective: Seen and examined.  Patient briefly awakens to answer questions with one-word responses then falls back asleep.  Unable to provide any substantial history  Objective: Vitals:   07/29/23 0530 07/29/23 0549 07/29/23 1000 07/29/23 1627  BP: (!) 125/54  (!) 114/52 100/75  Pulse: 87  78 77  Resp: 18  14 17   Temp:  98.5 F (36.9 C) 98 F (36.7 C) 98.4 F (36.9 C)  TempSrc:    Oral  SpO2: 96%  94% 94%  Weight:      Height:        Intake/Output Summary (Last 24 hours) at 07/29/2023 1654 Last data filed at 07/29/2023 0152 Gross per 24 hour  Intake 200 ml  Output 1 ml  Net 199 ml   Filed Weights   07/28/23 1710  Weight: 135.2 kg    Examination:  General exam: Appears calm and comfortable  Respiratory system: Poor resp effort.  Lungs overall clear.  Room air Cardiovascular system: S1-S2, RRR, no murmurs, no pedal edema Gastrointestinal system: Soft, nontender, nondistended, normal bowel sounds Central nervous system: Lethargic unable to assess orientation Extremities: Unable to assess power.  Gait  not assessed Skin: No rashes, lesions or ulcers Psychiatry: Judgement and insight appear impaired. Mood & affect lethargic.     Data Reviewed: I have personally reviewed following labs and imaging studies  CBC: Recent Labs  Lab 07/28/23 1721 07/29/23 0428  WBC 5.4  4.7  HGB 8.0* 7.6*  HCT 25.9* 25.1*  MCV 73.6* 74.3*  PLT 172 167   Basic Metabolic Panel: Recent Labs  Lab 07/28/23 1720 07/28/23 1721 07/29/23 0428  NA  --  134* 136  K  --  3.3* 2.9*  CL  --  95* 102  CO2  --  27 26  GLUCOSE  --  194* 125*  BUN  --  36* 34*  CREATININE  --  1.26* 1.15*  CALCIUM  --  9.0 8.7*  MG 1.5*  --  1.6*   GFR: Estimated Creatinine Clearance: 72.2 mL/min (A) (by C-G formula based on SCr of 1.15 mg/dL (H)). Liver Function Tests: Recent Labs  Lab 07/28/23 1721 07/29/23 0428  AST 44* 40  ALT 20 19  ALKPHOS 105 91  BILITOT 3.9* 3.5*  PROT 6.6 5.9*  ALBUMIN 3.2* 2.9*   Recent Labs  Lab 07/28/23 1721  LIPASE 36   Recent Labs  Lab 07/28/23 1721  AMMONIA 120*   Coagulation Profile: No results for input(s): "INR", "PROTIME" in the last 168 hours. Cardiac Enzymes: No results for input(s): "CKTOTAL", "CKMB", "CKMBINDEX", "TROPONINI" in the last 168 hours. BNP (last 3 results) No results for input(s): "PROBNP" in the last 8760 hours. HbA1C: No results for input(s): "HGBA1C" in the last 72 hours. CBG: No results for input(s): "GLUCAP" in the last 168 hours. Lipid Profile: No results for input(s): "CHOL", "HDL", "LDLCALC", "TRIG", "CHOLHDL", "LDLDIRECT" in the last 72 hours. Thyroid Function Tests: No results for input(s): "TSH", "T4TOTAL", "FREET4", "T3FREE", "THYROIDAB" in the last 72 hours. Anemia Panel: No results for input(s): "VITAMINB12", "FOLATE", "FERRITIN", "TIBC", "IRON", "RETICCTPCT" in the last 72 hours. Sepsis Labs: No results for input(s): "PROCALCITON", "LATICACIDVEN" in the last 168 hours.  No results found for this or any previous visit (from the past 240 hour(s)).       Radiology Studies: DG Chest Port 1 View  Result Date: 07/28/2023 CLINICAL DATA:  Altered level of consciousness, weakness EXAM: PORTABLE CHEST 1 VIEW COMPARISON:  06/24/2023 FINDINGS: Single frontal view of the chest demonstrates a stable right  chest wall port. The cardiac silhouette is unremarkable. No airspace disease, effusion, or pneumothorax. No acute bony abnormalities. IMPRESSION: 1. No acute intrathoracic process. Electronically Signed   By: Sharlet Salina M.D.   On: 07/28/2023 21:54        Scheduled Meds:  cyanocobalamin  1,000 mcg Oral Daily   folic acid  1 mg Oral Daily   heparin  5,000 Units Subcutaneous Q8H   lactulose  30 g Oral TID   rifaximin  550 mg Oral BID   Continuous Infusions:  magnesium sulfate bolus IVPB 2 g (07/29/23 1614)   potassium chloride 10 mEq (07/29/23 1612)     LOS: 1 day    Tresa Moore, MD [facility]   If 7PM-7AM, please contact night-coverage  07/29/2023, 4:54 PM

## 2023-07-30 ENCOUNTER — Encounter
Payer: No Typology Code available for payment source | Attending: Physical Medicine and Rehabilitation | Admitting: Physical Medicine and Rehabilitation

## 2023-07-30 DIAGNOSIS — G6289 Other specified polyneuropathies: Secondary | ICD-10-CM | POA: Insufficient documentation

## 2023-07-30 DIAGNOSIS — R5383 Other fatigue: Secondary | ICD-10-CM | POA: Insufficient documentation

## 2023-07-30 DIAGNOSIS — K7682 Hepatic encephalopathy: Secondary | ICD-10-CM | POA: Diagnosis not present

## 2023-07-30 LAB — COMPREHENSIVE METABOLIC PANEL
ALT: 20 U/L (ref 0–44)
AST: 42 U/L — ABNORMAL HIGH (ref 15–41)
Albumin: 3 g/dL — ABNORMAL LOW (ref 3.5–5.0)
Alkaline Phosphatase: 98 U/L (ref 38–126)
Anion gap: 9 (ref 5–15)
BUN: 33 mg/dL — ABNORMAL HIGH (ref 8–23)
CO2: 27 mmol/L (ref 22–32)
Calcium: 9.2 mg/dL (ref 8.9–10.3)
Chloride: 103 mmol/L (ref 98–111)
Creatinine, Ser: 1.17 mg/dL — ABNORMAL HIGH (ref 0.44–1.00)
GFR, Estimated: 52 mL/min — ABNORMAL LOW (ref >60)
Glucose, Bld: 122 mg/dL — ABNORMAL HIGH (ref 70–99)
Potassium: 3.8 mmol/L (ref 3.5–5.1)
Sodium: 139 mmol/L (ref 135–145)
Total Bilirubin: 3.5 mg/dL — ABNORMAL HIGH (ref <1.2)
Total Protein: 6.1 g/dL — ABNORMAL LOW (ref 6.5–8.1)

## 2023-07-30 LAB — CBC WITH DIFFERENTIAL/PLATELET
Abs Immature Granulocytes: 0.06 10*3/uL (ref 0.00–0.07)
Basophils Absolute: 0 10*3/uL (ref 0.0–0.1)
Basophils Relative: 1 %
Eosinophils Absolute: 0.4 10*3/uL (ref 0.0–0.5)
Eosinophils Relative: 5 %
HCT: 27.6 % — ABNORMAL LOW (ref 36.0–46.0)
Hemoglobin: 8.5 g/dL — ABNORMAL LOW (ref 12.0–15.0)
Immature Granulocytes: 1 %
Lymphocytes Relative: 17 %
Lymphs Abs: 1.3 10*3/uL (ref 0.7–4.0)
MCH: 22.8 pg — ABNORMAL LOW (ref 26.0–34.0)
MCHC: 30.8 g/dL (ref 30.0–36.0)
MCV: 74 fL — ABNORMAL LOW (ref 80.0–100.0)
Monocytes Absolute: 0.7 10*3/uL (ref 0.1–1.0)
Monocytes Relative: 9 %
Neutro Abs: 5.4 10*3/uL (ref 1.7–7.7)
Neutrophils Relative %: 67 %
Platelets: 154 10*3/uL (ref 150–400)
RBC: 3.73 MIL/uL — ABNORMAL LOW (ref 3.87–5.11)
RDW: 20.4 % — ABNORMAL HIGH (ref 11.5–15.5)
WBC: 8 10*3/uL (ref 4.0–10.5)
nRBC: 0.3 % — ABNORMAL HIGH (ref 0.0–0.2)

## 2023-07-30 NOTE — Plan of Care (Signed)
Pt's first night on unit.  Will assess plan of care and update as needed.  Problem: Education: Goal: Knowledge of General Education information will improve Description: Including pain rating scale, medication(s)/side effects and non-pharmacologic comfort measures Outcome: Progressing   Problem: Health Behavior/Discharge Planning: Goal: Ability to manage health-related needs will improve Outcome: Progressing   Problem: Clinical Measurements: Goal: Ability to maintain clinical measurements within normal limits will improve Outcome: Progressing Goal: Will remain free from infection Outcome: Progressing Goal: Diagnostic test results will improve Outcome: Progressing Goal: Respiratory complications will improve Outcome: Progressing Goal: Cardiovascular complication will be avoided Outcome: Progressing   Problem: Activity: Goal: Risk for activity intolerance will decrease Outcome: Progressing   Problem: Nutrition: Goal: Adequate nutrition will be maintained Outcome: Progressing   Problem: Coping: Goal: Level of anxiety will decrease Outcome: Progressing   Problem: Elimination: Goal: Will not experience complications related to bowel motility Outcome: Progressing Goal: Will not experience complications related to urinary retention Outcome: Progressing   Problem: Pain Management: Goal: General experience of comfort will improve Outcome: Progressing   Problem: Safety: Goal: Ability to remain free from injury will improve Outcome: Progressing   Problem: Skin Integrity: Goal: Risk for impaired skin integrity will decrease Outcome: Progressing

## 2023-07-30 NOTE — Progress Notes (Signed)
Pt refused 2200 Lactulose and Xifaxan.  This RN attempted to encourage pt multiple times to take medications from 2125 to 0100.

## 2023-07-30 NOTE — Progress Notes (Signed)
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   12/12: Patient has been excepting her lactulose and having multiple bowel movements.  Mental status remains poor   Assessment & Plan:   Principal Problem:   Hepatic encephalopathy (HCC) Active Problems:   Chronic kidney disease, stage 3b (HCC)   HLD (hyperlipidemia)   Depression with anxiety   Type 2 diabetes mellitus with hyperlipidemia (HCC)   OSA on CPAP   Anemia in chronic kidney disease (CKD)   Hypokalemia   AMS (altered mental status)   Aortic atherosclerosis (HCC)   Beta thalassemia intermedia (HCC)   Obesity, Class III, BMI 40-49.9 (morbid obesity) (HCC)   Hypomagnesemia   Hepatic encephalopathy (HCC) Decompensated NASH cirrhosis Suspect secondary to patient refusal to take lactulose at nursing facility Status post lactulose 30 g p.o. one-time dose ordered by EDP Plan: Continue oral lactulose 30 g 3 times daily Titrate to 3-4 soft bowel movements daily.  If having more bowel movements or diarrhea occurs will need to scale back on lactulose dosing Continue home rifaximin Monitor mentation  Chronic kidney disease, stage 3b (HCC) Patient is currently at baseline renal function   Hypomagnesemia Monitor and replace as necessary   Obesity, Class III, BMI 40-49.9 (morbid obesity) (HCC) This complicates overall care and prognosis.    AMS (altered  mental status) With history of frequent falls in setting of lactulose noncompliance Fall precautions, aspiration precaution PT and OT evaluations when appropriate   Hypokalemia Monitor and replace as necessary   Anemia in chronic kidney disease (CKD) Currently no clinical concerns for bleeding at this time Patient's baseline range over the last month has been 7.1-9.1 No indication for transfusion at this time   OSA on CPAP CPAP nightly ordered on admission   DVT prophylaxis: Heparin subcu Code Status: Full Family Communication: None Disposition Plan: Status is: Inpatient Remains inpatient appropriate because: Hepatic encephalopathy, decompensated hepatic cirrhosis   Level of care: Telemetry Medical  Consultants:  None  Procedures:  None  Antimicrobials: None   Subjective: Seen and examined.  Mental status essentially unchanged over interval.  Patient having bowel movements and accepting of oral lactulose.  Objective: Vitals:   07/29/23 2020 07/29/23 2125 07/30/23 0109 07/30/23 0757  BP: (!) 115/50  132/61 (!) 111/53  Pulse: 84  82 76  Resp: 19  18 16   Temp: 98.9 F (37.2 C)  99.1 F (37.3 C) 98.4 F (36.9 C)  TempSrc: Oral  Oral   SpO2: 96%  94% 95%  Weight:      Height:  5\' 9"  (1.753 m)      Intake/Output Summary (Last 24 hours) at 07/30/2023 1227 Last data filed at 07/29/2023 1916 Gross per 24 hour  Intake 150 ml  Output --  Net 150 ml   Filed Weights   07/28/23 1710  Weight: 135.2 kg    Examination:  General exam: Lethargic Respiratory system: Poor resp effort.  Lungs overall clear.  Room air Cardiovascular system: S1-S2, RRR, no murmurs,  no pedal edema Gastrointestinal system: Soft, nontender, nondistended, normal bowel sounds Central nervous system: Lethargic unable to assess orientation Extremities: Unable to assess power.  Gait not assessed Skin: Jaundice skin, sclera icteric Psychiatry: Judgement and insight appear impaired. Mood &  affect lethargic.     Data Reviewed: I have personally reviewed following labs and imaging studies  CBC: Recent Labs  Lab 07/28/23 1721 07/29/23 0428 07/30/23 0817  WBC 5.4 4.7 8.0  NEUTROABS  --   --  5.4  HGB 8.0* 7.6* 8.5*  HCT 25.9* 25.1* 27.6*  MCV 73.6* 74.3* 74.0*  PLT 172 167 154   Basic Metabolic Panel: Recent Labs  Lab 07/28/23 1720 07/28/23 1721 07/29/23 0428 07/30/23 0817  NA  --  134* 136 139  K  --  3.3* 2.9* 3.8  CL  --  95* 102 103  CO2  --  27 26 27   GLUCOSE  --  194* 125* 122*  BUN  --  36* 34* 33*  CREATININE  --  1.26* 1.15* 1.17*  CALCIUM  --  9.0 8.7* 9.2  MG 1.5*  --  1.6*  --    GFR: Estimated Creatinine Clearance: 71 mL/min (A) (by C-G formula based on SCr of 1.17 mg/dL (H)). Liver Function Tests: Recent Labs  Lab 07/28/23 1721 07/29/23 0428 07/30/23 0817  AST 44* 40 42*  ALT 20 19 20   ALKPHOS 105 91 98  BILITOT 3.9* 3.5* 3.5*  PROT 6.6 5.9* 6.1*  ALBUMIN 3.2* 2.9* 3.0*   Recent Labs  Lab 07/28/23 1721  LIPASE 36   Recent Labs  Lab 07/28/23 1721  AMMONIA 120*   Coagulation Profile: No results for input(s): "INR", "PROTIME" in the last 168 hours. Cardiac Enzymes: No results for input(s): "CKTOTAL", "CKMB", "CKMBINDEX", "TROPONINI" in the last 168 hours. BNP (last 3 results) No results for input(s): "PROBNP" in the last 8760 hours. HbA1C: No results for input(s): "HGBA1C" in the last 72 hours. CBG: No results for input(s): "GLUCAP" in the last 168 hours. Lipid Profile: No results for input(s): "CHOL", "HDL", "LDLCALC", "TRIG", "CHOLHDL", "LDLDIRECT" in the last 72 hours. Thyroid Function Tests: No results for input(s): "TSH", "T4TOTAL", "FREET4", "T3FREE", "THYROIDAB" in the last 72 hours. Anemia Panel: No results for input(s): "VITAMINB12", "FOLATE", "FERRITIN", "TIBC", "IRON", "RETICCTPCT" in the last 72 hours. Sepsis Labs: No results for input(s): "PROCALCITON", "LATICACIDVEN" in the last 168 hours.  No results  found for this or any previous visit (from the past 240 hours).       Radiology Studies: DG Chest Port 1 View Result Date: 07/28/2023 CLINICAL DATA:  Altered level of consciousness, weakness EXAM: PORTABLE CHEST 1 VIEW COMPARISON:  06/24/2023 FINDINGS: Single frontal view of the chest demonstrates a stable right chest wall port. The cardiac silhouette is unremarkable. No airspace disease, effusion, or pneumothorax. No acute bony abnormalities. IMPRESSION: 1. No acute intrathoracic process. Electronically Signed   By: Sharlet Salina M.D.   On: 07/28/2023 21:54        Scheduled Meds:  cyanocobalamin  1,000 mcg Oral Daily   folic acid  1 mg Oral Daily   heparin  5,000 Units Subcutaneous Q8H   lactulose  30 g Oral TID   rifaximin  550 mg Oral BID   Continuous Infusions:     LOS: 2 days    Tresa Moore, MD [facility]   If 7PM-7AM, please contact night-coverage  07/30/2023, 12:27 PM

## 2023-07-31 DIAGNOSIS — K7682 Hepatic encephalopathy: Secondary | ICD-10-CM | POA: Diagnosis not present

## 2023-07-31 NOTE — Care Management Important Message (Signed)
Important Message  Patient Details  Name: Rachel Huber MRN: [id number] Date of Birth: 11-09-1955   Important Message Given:  N/A - LOS <3 / Initial given by admissions     Olegario Messier A Candace Ramus 07/31/2023, 10:05 AM

## 2023-07-31 NOTE — Progress Notes (Signed)
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   12/12: Patient has been excepting her lactulose and having multiple bowel movements.  Mental status remains poor  12/13: No clinical status changes.  Patient is a little bit more awake.  Has been taking her p.o. lactulose.   Assessment & Plan:   Principal Problem:   Hepatic encephalopathy (HCC) Active Problems:   Chronic kidney disease, stage 3b (HCC)   HLD (hyperlipidemia)   Depression with anxiety   Type 2 diabetes mellitus with hyperlipidemia (HCC)   OSA on CPAP   Anemia in chronic kidney disease (CKD)   Hypokalemia   AMS (altered mental status)   Aortic atherosclerosis (HCC)   Beta thalassemia intermedia (HCC)   Obesity, Class III, BMI 40-49.9 (morbid obesity) (HCC)   Hypomagnesemia   Hepatic encephalopathy (HCC) Decompensated NASH cirrhosis Suspect secondary to patient refusal to take lactulose at nursing facility Status post lactulose 30 g p.o. one-time dose ordered by EDP Plan: Continue oral lactulose 30 g 3 times daily Titrate to 3-4 soft bowel movements daily.  If having more bowel movements or diarrhea occurs will need to scale back on lactulose dosing Continue home rifaximin Monitor mentation, remains poor but improving Diet advancement as tolerated  Chronic kidney disease, stage 3b (HCC) Patient is currently at baseline renal function    Hypomagnesemia Monitor and replace as necessary   Obesity, Class III, BMI 40-49.9 (morbid obesity) (HCC) This complicates overall care and prognosis.    AMS (altered mental status) With history of frequent falls in setting of lactulose noncompliance Fall precautions, aspiration precaution PT and OT evaluations when appropriate   Hypokalemia Monitor and replace as necessary   Anemia in chronic kidney disease (CKD) Currently no clinical concerns for bleeding at this time Patient's baseline range over the last month has been 7.1-9.1 No indication for transfusion at this time   OSA on CPAP CPAP nightly ordered on admission   DVT prophylaxis: Heparin subcu Code Status: Full Family Communication: Rodolph Bong [phone] on 12/13 Disposition Plan: Status is: Inpatient Remains inpatient appropriate because: Hepatic encephalopathy, decompensated hepatic cirrhosis   Level of care: Telemetry Medical  Consultants:  None  Procedures:  None  Antimicrobials: None   Subjective: Seen and examined.  May be slightly more awake this morning.  Having bowel movements.  Objective: Vitals:   07/30/23 1716 07/30/23 2138 07/31/23 0604 07/31/23 0745  BP: (!) 114/56 (!) 117/56 (!) 125/55 134/61  Pulse: 85 81 88 82  Resp: 16 18 16 17   Temp: 97.7 F (36.5 C) 98.5 F (36.9 C) 98 F (36.7 C) 97.9 F (36.6 C)  TempSrc:  Oral Oral Oral  SpO2: 94% 95% 96% 96%  Weight:      Height:       No intake or output data in the 24 hours ending 07/31/23 1109  Filed Weights   07/28/23 1710  Weight: 135.2 kg    Examination:  General exam: Lethargic Respiratory system: Poor respiratory effort.  Bibasilar  crackles.  Normal work of breathing.  Room air Cardiovascular system: S1-S2, RRR, no murmurs, no pedal edema Gastrointestinal system: Soft, nontender, nondistended, normal bowel sounds Central nervous system: Lethargic unable to assess orientation Extremities: Unable to  assess power.  Gait not assessed Skin: Jaundice skin, sclera icteric Psychiatry: Judgement and insight appear impaired. Mood & affect lethargic.     Data Reviewed: I have personally reviewed following labs and imaging studies  CBC: Recent Labs  Lab 07/28/23 1721 07/29/23 0428 07/30/23 0817  WBC 5.4 4.7 8.0  NEUTROABS  --   --  5.4  HGB 8.0* 7.6* 8.5*  HCT 25.9* 25.1* 27.6*  MCV 73.6* 74.3* 74.0*  PLT 172 167 154   Basic Metabolic Panel: Recent Labs  Lab 07/28/23 1720 07/28/23 1721 07/29/23 0428 07/30/23 0817  NA  --  134* 136 139  K  --  3.3* 2.9* 3.8  CL  --  95* 102 103  CO2  --  27 26 27   GLUCOSE  --  194* 125* 122*  BUN  --  36* 34* 33*  CREATININE  --  1.26* 1.15* 1.17*  CALCIUM  --  9.0 8.7* 9.2  MG 1.5*  --  1.6*  --    GFR: Estimated Creatinine Clearance: 71 mL/min (A) (by C-G formula based on SCr of 1.17 mg/dL (H)). Liver Function Tests: Recent Labs  Lab 07/28/23 1721 07/29/23 0428 07/30/23 0817  AST 44* 40 42*  ALT 20 19 20   ALKPHOS 105 91 98  BILITOT 3.9* 3.5* 3.5*  PROT 6.6 5.9* 6.1*  ALBUMIN 3.2* 2.9* 3.0*   Recent Labs  Lab 07/28/23 1721  LIPASE 36   Recent Labs  Lab 07/28/23 1721  AMMONIA 120*   Coagulation Profile: No results for input(s): "INR", "PROTIME" in the last 168 hours. Cardiac Enzymes: No results for input(s): "CKTOTAL", "CKMB", "CKMBINDEX", "TROPONINI" in the last 168 hours. BNP (last 3 results) No results for input(s): "PROBNP" in the last 8760 hours. HbA1C: No results for input(s): "HGBA1C" in the last 72 hours. CBG: No results for input(s): "GLUCAP" in the last 168 hours. Lipid Profile: No results for input(s): "CHOL", "HDL", "LDLCALC", "TRIG", "CHOLHDL", "LDLDIRECT" in the last 72 hours. Thyroid Function Tests: No results for input(s): "TSH", "T4TOTAL", "FREET4", "T3FREE", "THYROIDAB" in the last 72 hours. Anemia Panel: No results for input(s): "VITAMINB12", "FOLATE", "FERRITIN", "TIBC", "IRON", "RETICCTPCT"  in the last 72 hours. Sepsis Labs: No results for input(s): "PROCALCITON", "LATICACIDVEN" in the last 168 hours.  No results found for this or any previous visit (from the past 240 hours).       Radiology Studies: No results found.       Scheduled Meds:  cyanocobalamin  1,000 mcg Oral Daily   folic acid  1 mg Oral Daily   heparin  5,000 Units Subcutaneous Q8H   lactulose  30 g Oral TID   rifaximin  550 mg Oral BID   Continuous Infusions:     LOS: 3 days    Tresa Moore, MD [facility]   If 7PM-7AM, please contact night-coverage  07/31/2023, 11:09 AM

## 2023-07-31 NOTE — TOC Initial Note (Signed)
Transition of Care [facility]) - Initial/Assessment Note    Patient Details  Name: Rachel Huber MRN: [id number] Date of Birth: 09/14/1955  Transition of Care [facility]) CM/SW Contact:    Liliana Cline, LCSW Phone Number: 07/31/2023, 12:18 PM  Clinical Narrative:                 Patient in from [facility] where she was recently discharged to for short term rehab. If patient needs more short term rehab, will need new PT/OT evals this admission. TOC will follow.  Expected Discharge Plan: Skilled Nursing Facility Barriers to Discharge: Continued Medical Work up   Patient Goals and CMS Choice            Expected Discharge Plan and Services                                              Prior Living Arrangements/Services                       Activities of Daily Living   ADL Screening (condition at time of admission) Independently performs ADLs?: No Does the patient have a NEW difficulty with bathing/dressing/toileting/self-feeding that is expected to last >3 days?: No Does the patient have a NEW difficulty with getting in/out of bed, walking, or climbing stairs that is expected to last >3 days?: No Does the patient have a NEW difficulty with communication that is expected to last >3 days?: No Is the patient deaf or have difficulty hearing?: No Does the patient have difficulty seeing, even when wearing glasses/contacts?: No Does the patient have difficulty concentrating, remembering, or making decisions?: Yes  Permission Sought/Granted                  Emotional Assessment              Admission diagnosis:  Hepatic encephalopathy (HCC) [K76.82] Patient Active Problem List   Diagnosis Date Noted   Obesity, Class III, BMI 40-49.9 (morbid obesity) (HCC) 07/28/2023   Hypomagnesemia 07/28/2023   Encephalopathy 06/24/2023   Beta thalassemia intermedia (HCC) 05/25/2023   Gout due to renal impairment 05/12/2023   Unspecified protein-calorie  malnutrition (HCC) 05/06/2023   Hypoalbuminemia due to protein-calorie malnutrition (HCC) 05/05/2023   Vitamin D deficiency 05/05/2023   Bilateral lower extremity edema 05/05/2023   Aortic atherosclerosis (HCC) 05/05/2023   AMS (altered mental status) 04/29/2023   Hepatic encephalopathy (HCC) 03/25/2023   Acute metabolic encephalopathy 03/25/2023   Acute cystitis with hematuria 03/25/2023   Acute hepatic encephalopathy (HCC) 01/19/2023   Pancytopenia (HCC) 01/06/2023   Anemia 01/06/2023   Internal hemorrhoid 01/06/2023   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   Symptomatic anemia 12/17/2022   Hypokalemia 11/06/2022   Acute on chronic anemia 11/03/2022   Hypotension 11/03/2022   AKI (acute kidney injury) (HCC) 10/15/2022   Rectal bleeding 06/17/2022   Obesity (BMI 30-39.9) 06/17/2022   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 mellitus (HCC) 06/17/2022   Grade IV internal hemorrhoids 05/16/2022   Thrombocytopenia (HCC) 03/05/2022   Anemia in chronic kidney disease (CKD) 02/07/2022   GI bleeding 02/04/2022   Chronic kidney disease, stage 3b (HCC) 02/04/2022   Depression with anxiety 02/04/2022   Liver cirrhosis secondary to NASH (HCC)    Insomnia 01/27/2022   Iron deficiency anemia due to chronic blood loss 02/13/2021   History  of uterine cancer 02/13/2021   Decompensated hepatic cirrhosis (HCC) 10/25/2020   OSA on CPAP 10/25/2020   Closed right hip fracture (HCC) 09/02/2020   Type 2 diabetes mellitus with hyperlipidemia (HCC) 09/02/2020   Thalassemia minor    HLD (hyperlipidemia)    Depression    Fall at home, initial encounter    PCP:  Lauro Regulus, MD Pharmacy:   [facility] [address], [place] - [address] [place] [address] Phone: [phone] Fax: [phone]     Social Drivers of Health (SDOH) Social History: SDOH Screenings   Food Insecurity: Patient Unable To Answer (07/29/2023)  Housing: Patient Unable To Answer (07/29/2023)   Transportation Needs: Patient Unable To Answer (07/29/2023)  Utilities: Patient Unable To Answer (07/29/2023)  Alcohol Screen: Low Risk  (04/04/2022)  Depression (PHQ2-9): Low Risk  (04/27/2023)  Financial Resource Strain: Low Risk  (06/03/2023)   Received from [facility]  Physical Activity: Inactive (04/04/2022)  Social Connections: Moderately Isolated (04/04/2022)  Stress: No Stress Concern Present (04/04/2022)  Tobacco Use: Medium Risk (07/28/2023)   SDOH Interventions:     Readmission Risk Interventions    04/30/2023   12:35 PM 12/19/2022   10:27 AM 11/04/2022   11:32 AM  Readmission Risk Prevention Plan  Transportation Screening Complete Complete Complete  Medication Review Oceanographer) Complete Complete Complete  PCP or Specialist appointment within 3-5 days of discharge Not Complete Complete Complete  HRI or Home Care Consult Complete Complete   SW Recovery Care/Counseling Consult Complete Complete Complete  Palliative Care Screening Not Applicable Not Applicable Not Applicable  Skilled Nursing Facility Not Complete Not Applicable Not Applicable

## 2023-08-01 DIAGNOSIS — K7682 Hepatic encephalopathy: Secondary | ICD-10-CM | POA: Diagnosis not present

## 2023-08-01 LAB — BASIC METABOLIC PANEL
Anion gap: 8 (ref 5–15)
BUN: 37 mg/dL — ABNORMAL HIGH (ref 8–23)
CO2: 27 mmol/L (ref 22–32)
Calcium: 9.4 mg/dL (ref 8.9–10.3)
Chloride: 109 mmol/L (ref 98–111)
Creatinine, Ser: 1.14 mg/dL — ABNORMAL HIGH (ref 0.44–1.00)
GFR, Estimated: 53 mL/min — ABNORMAL LOW (ref >60)
Glucose, Bld: 117 mg/dL — ABNORMAL HIGH (ref 70–99)
Potassium: 3.5 mmol/L (ref 3.5–5.1)
Sodium: 144 mmol/L (ref 135–145)

## 2023-08-01 LAB — CBC WITH DIFFERENTIAL/PLATELET
Abs Immature Granulocytes: 0.03 10*3/uL (ref 0.00–0.07)
Basophils Absolute: 0.1 10*3/uL (ref 0.0–0.1)
Basophils Relative: 1 %
Eosinophils Absolute: 0.9 10*3/uL — ABNORMAL HIGH (ref 0.0–0.5)
Eosinophils Relative: 14 %
HCT: 25.9 % — ABNORMAL LOW (ref 36.0–46.0)
Hemoglobin: 7.8 g/dL — ABNORMAL LOW (ref 12.0–15.0)
Immature Granulocytes: 1 %
Lymphocytes Relative: 21 %
Lymphs Abs: 1.3 10*3/uL (ref 0.7–4.0)
MCH: 22.5 pg — ABNORMAL LOW (ref 26.0–34.0)
MCHC: 30.1 g/dL (ref 30.0–36.0)
MCV: 74.9 fL — ABNORMAL LOW (ref 80.0–100.0)
Monocytes Absolute: 0.5 10*3/uL (ref 0.1–1.0)
Monocytes Relative: 8 %
Neutro Abs: 3.5 10*3/uL (ref 1.7–7.7)
Neutrophils Relative %: 55 %
Platelets: 156 10*3/uL (ref 150–400)
RBC: 3.46 MIL/uL — ABNORMAL LOW (ref 3.87–5.11)
RDW: 20.2 % — ABNORMAL HIGH (ref 11.5–15.5)
WBC: 6.2 10*3/uL (ref 4.0–10.5)
nRBC: 0 % (ref 0.0–0.2)

## 2023-08-01 NOTE — Progress Notes (Signed)
OT Cancellation Note  Patient Details Name: JAMERICA SCHLOTZHAUER MRN: [id number] DOB: 1956-02-11   Cancelled Treatment:    Reason Eval/Treat Not Completed: Fatigue/lethargy limiting ability to participate (OT orders received, chart reviewed. RN reported pt with improvements in alertness. Pt opened eyes briefly for OT with multimodal cues and sternal rub, however, pt immediately went back to sleep. Unable to maintain appropriate alertness/arousal to participate in therapy. Will re-attempt evaluation next service date as long as pt is medically apporpriate.)  Gerrie Nordmann 08/01/2023, 2:00 PM

## 2023-08-01 NOTE — Plan of Care (Signed)
  Problem: Education: Goal: Knowledge of General Education information will improve Description: Including pain rating scale, medication(s)/side effects and non-pharmacologic comfort measures Outcome: Not Progressing   Problem: Clinical Measurements: Goal: Ability to maintain clinical measurements within normal limits will improve Outcome: Progressing Goal: Will remain free from infection Outcome: Progressing Goal: Diagnostic test results will improve Outcome: Progressing Goal: Respiratory complications will improve Outcome: Progressing Goal: Cardiovascular complication will be avoided Outcome: Progressing   Problem: Activity: Goal: Risk for activity intolerance will decrease Outcome: Not Progressing   Problem: Nutrition: Goal: Adequate nutrition will be maintained Outcome: Not Progressing

## 2023-08-01 NOTE — Progress Notes (Signed)
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   12/12: Patient has been excepting her lactulose and having multiple bowel movements.  Mental status remains poor  12/13: No clinical status changes.  Patient is a little bit more awake.  Has been taking her p.o. lactulose.   Assessment & Plan:   Principal Problem:   Hepatic encephalopathy (HCC) Active Problems:   Chronic kidney disease, stage 3b (HCC)   HLD (hyperlipidemia)   Depression with anxiety   Type 2 diabetes mellitus with hyperlipidemia (HCC)   OSA on CPAP   Anemia in chronic kidney disease (CKD)   Hypokalemia   AMS (altered mental status)   Aortic atherosclerosis (HCC)   Beta thalassemia intermedia (HCC)   Obesity, Class III, BMI 40-49.9 (morbid obesity) (HCC)   Hypomagnesemia   Hepatic encephalopathy (HCC) Decompensated NASH cirrhosis Suspect secondary to patient refusal to take lactulose at nursing facility Status post lactulose 30 g p.o. one-time dose ordered by EDP Plan: Continue oral lactulose 30 g 3 times daily Titrate to 3-4 soft bowel movements daily.  If having more bowel movements or diarrhea occurs will need to scale back on lactulose dosing Continue home rifaximin Monitor mentation.  Improved today Okay for diet as tolerated  Chronic kidney disease, stage 3b (HCC) Patient is currently at baseline renal function    Hypomagnesemia Monitor and replace as necessary   Obesity, Class III, BMI 40-49.9 (morbid obesity) (HCC) Complicating factor to overall care and prognosis   AMS (altered mental status) With history of frequent falls in setting of lactulose noncompliance Fall precautions, aspiration precaution PT OT evaluations ordered 12/14   Hypokalemia Monitor and replace as necessary   Anemia in chronic kidney disease (CKD) Currently no clinical concerns for bleeding at this time Patient's baseline range over the last month has been 7.1-9.1 No indication for transfusion at this time   OSA on CPAP CPAP nightly ordered on admission   DVT prophylaxis: Heparin subcu Code Status: Full Family Communication: Rodolph Bong [phone] on 12/13 Disposition Plan: Status is: Inpatient Remains inpatient appropriate because: Hepatic encephalopathy, decompensated hepatic cirrhosis   Level of care: Telemetry Medical  Consultants:  None  Procedures:  None  Antimicrobials: None   Subjective: Seen and examined.  Much more awake this morning.  Able to hold a conversation.  Still lethargic  Objective: Vitals:   07/31/23 1642 07/31/23 2233 08/01/23 0541 08/01/23 0821  BP: (!) 120/49 (!) 100/52 (!) 104/47 (!) 107/51  Pulse: 87 79 78 76  Resp: 18 16 18 17   Temp: 98.3 F (36.8 C) 98.3 F (36.8 C) 97.7 F (36.5 C) 98.3 F (36.8 C)  TempSrc: Axillary  Oral Oral  SpO2: 95% 97% 91% 94%  Weight:      Height:       No intake or output data in the 24 hours ending 08/01/23 1150  Filed Weights   07/28/23 1710  Weight: 135.2 kg    Examination:  General exam: Appears fatigued Respiratory system: Bibasilar  crackles.  Normal work of breathing.  Room air Cardiovascular system: S1-S2, RRR, no murmurs, no pedal edema Gastrointestinal system: Soft, nontender, nondistended, normal bowel sounds Central nervous system: Alert.  Oriented to person only. Extremities: Unable to assess  power.  Gait not assessed Skin: Diffuse jaundice.  Sclera icteric Psychiatry: Judgement and insight appear impaired. Mood & affect lethargic.     Data Reviewed: I have personally reviewed following labs and imaging studies  CBC: Recent Labs  Lab 07/28/23 1721 07/29/23 0428 07/30/23 0817 08/01/23 0807  WBC 5.4 4.7 8.0 6.2  NEUTROABS  --   --  5.4 3.5  HGB 8.0* 7.6* 8.5* 7.8*  HCT 25.9* 25.1* 27.6* 25.9*  MCV 73.6* 74.3* 74.0* 74.9*  PLT 172 167 154 156   Basic Metabolic Panel: Recent Labs  Lab 07/28/23 1720 07/28/23 1721 07/29/23 0428 07/30/23 0817 08/01/23 0807  NA  --  134* 136 139 144  K  --  3.3* 2.9* 3.8 3.5  CL  --  95* 102 103 109  CO2  --  27 26 27 27   GLUCOSE  --  194* 125* 122* 117*  BUN  --  36* 34* 33* 37*  CREATININE  --  1.26* 1.15* 1.17* 1.14*  CALCIUM  --  9.0 8.7* 9.2 9.4  MG 1.5*  --  1.6*  --   --    GFR: Estimated Creatinine Clearance: 72.9 mL/min (A) (by C-G formula based on SCr of 1.14 mg/dL (H)). Liver Function Tests: Recent Labs  Lab 07/28/23 1721 07/29/23 0428 07/30/23 0817  AST 44* 40 42*  ALT 20 19 20   ALKPHOS 105 91 98  BILITOT 3.9* 3.5* 3.5*  PROT 6.6 5.9* 6.1*  ALBUMIN 3.2* 2.9* 3.0*   Recent Labs  Lab 07/28/23 1721  LIPASE 36   Recent Labs  Lab 07/28/23 1721  AMMONIA 120*   Coagulation Profile: No results for input(s): "INR", "PROTIME" in the last 168 hours. Cardiac Enzymes: No results for input(s): "CKTOTAL", "CKMB", "CKMBINDEX", "TROPONINI" in the last 168 hours. BNP (last 3 results) No results for input(s): "PROBNP" in the last 8760 hours. HbA1C: No results for input(s): "HGBA1C" in the last 72 hours. CBG: No results for input(s): "GLUCAP" in the last 168 hours. Lipid Profile: No results for input(s): "CHOL", "HDL", "LDLCALC", "TRIG", "CHOLHDL", "LDLDIRECT" in the last 72 hours. Thyroid Function Tests: No results for input(s): "TSH", "T4TOTAL", "FREET4", "T3FREE", "THYROIDAB" in the last 72 hours. Anemia  Panel: No results for input(s): "VITAMINB12", "FOLATE", "FERRITIN", "TIBC", "IRON", "RETICCTPCT" in the last 72 hours. Sepsis Labs: No results for input(s): "PROCALCITON", "LATICACIDVEN" in the last 168 hours.  No results found for this or any previous visit (from the past 240 hours).       Radiology Studies: No results found.       Scheduled Meds:  cyanocobalamin  1,000 mcg Oral Daily   folic acid  1 mg Oral Daily   heparin  5,000 Units Subcutaneous Q8H   lactulose  30 g Oral TID   rifaximin  550 mg Oral BID   Continuous Infusions:     LOS: 4 days    Tresa Moore, MD [facility]   If 7PM-7AM, please contact night-coverage  08/01/2023, 11:50 AM

## 2023-08-02 DIAGNOSIS — D631 Anemia in chronic kidney disease: Secondary | ICD-10-CM

## 2023-08-02 DIAGNOSIS — F418 Other specified anxiety disorders: Secondary | ICD-10-CM | POA: Diagnosis not present

## 2023-08-02 DIAGNOSIS — K7682 Hepatic encephalopathy: Secondary | ICD-10-CM | POA: Diagnosis not present

## 2023-08-02 DIAGNOSIS — N1832 Chronic kidney disease, stage 3b: Secondary | ICD-10-CM | POA: Diagnosis not present

## 2023-08-02 DIAGNOSIS — Z515 Encounter for palliative care: Secondary | ICD-10-CM | POA: Diagnosis not present

## 2023-08-02 NOTE — Evaluation (Signed)
Occupational Therapy Evaluation Patient Details Name: Rachel Huber MRN: [id number] DOB: [birth year] Today's Date: 08/02/2023   History of Present Illness Patient is a 67 year old female coming from [place] with with AMS, hepatic encephalopathy, decompensated NASH cirrhosis. History of anemia, CKD, cirrhosis, hypertension, status post TIPS procedure   Clinical Impression   Rachel Huber was seen for OT evaluation this date. Prior to hospital admission, pt was receiving care in a STR facility. Per chart, pt lives with a spouse. Pt is pleasantly confused and quite lethargic t/o session however does become more alert as session progresses. No family/caregiver at bedside to confirm PLOF. Pt presents to acute OT demonstrating impaired ADL performance and functional mobility 2/2 decreased cognition, generalized weakness, poor sitting balance, and decreased activity tolerance (See OT problem list for additional functional deficits). Pt currently requires MAX A +2 for bed mobility with MOD A for seated UB ADL management at EOB.  Pt would benefit from skilled OT services to address noted impairments and functional limitations (see below for any additional details) in order to maximize safety and independence while minimizing falls risk and caregiver burden. Anticipate the need for follow up OT services upon acute hospital DC.        If plan is discharge home, recommend the following: Two people to help with walking and/or transfers;Two people to help with bathing/dressing/bathroom;Assistance with cooking/housework;Assist for transportation;Help with stairs or ramp for entrance;Direct supervision/assist for medications management;Supervision due to cognitive status    Functional Status Assessment  Patient has had a recent decline in their functional status and demonstrates the ability to make significant improvements in function in a reasonable and predictable amount of time.  Equipment  Recommendations  Other (comment) (defer to next venue of care.)    Recommendations for Other Services       Precautions / Restrictions Precautions Precautions: Fall Restrictions Weight Bearing Restrictions Per Provider Order: No      Mobility Bed Mobility Overal bed mobility: Needs Assistance Bed Mobility: Supine to Sit, Sit to Supine     Supine to sit: Max assist, +2 for physical assistance Sit to supine: Max assist, +2 for physical assistance   General bed mobility comments: assistance for LE and trunk support. verbal cues for task initiation and sequencing    Transfers Overall transfer level: Needs assistance   Transfers: Bed to chair/wheelchair/BSC            Lateral/Scoot Transfers: Max assist, +2 physical assistance General transfer comment: +2 person assistance for lateral scooting to the right with cues for task initiation      Balance Overall balance assessment: Needs assistance Sitting-balance support: Feet supported Sitting balance-Leahy Scale: Poor Sitting balance - Comments: intermittent external support required. intermittently able to sit with CGA for safety but does not sustain.                                   ADL either performed or assessed with clinical judgement   ADL Overall ADL's : Needs assistance/impaired                                       General ADL Comments: MAX A +2 for bed mobility, pt requires consistent MIN A to CGA for static sitting balance at EOB with consistent multimodal cueing for upright posture. Pt requires MOD A  to bring washcloth up to face this date, she has difficulty removing her glasses prior to grooming and requires MAX A to doff glasses but at end of session is able to don glasses with supervision. Consistent cueing required to attend to task at hand.     Vision Baseline Vision/History: 1 Wears glasses Ability to See in Adequate Light: 1 Impaired Patient Visual Report: No change  from baseline       Perception         Praxis         Pertinent Vitals/Pain Pain Assessment Pain Assessment: No/denies pain     Extremity/Trunk Assessment Upper Extremity Assessment Upper Extremity Assessment: Generalized weakness;Difficult to assess due to impaired cognition   Lower Extremity Assessment Lower Extremity Assessment: Generalized weakness;Difficult to assess due to impaired cognition   Cervical / Trunk Assessment Cervical / Trunk Assessment: Kyphotic   Communication Communication Communication: Difficulty following commands/understanding;Difficulty communicating thoughts/reduced clarity of speech Following commands: Follows one step commands inconsistently;Follows one step commands with increased time Cueing Techniques: Verbal cues;Gestural cues;Tactile cues;Visual cues   Cognition Arousal: Lethargic Behavior During Therapy: WFL for tasks assessed/performed Overall Cognitive Status: Impaired/Different from baseline Area of Impairment: Following commands, Problem solving, Safety/judgement                       Following Commands: Follows one step commands inconsistently, Follows one step commands with increased time Safety/Judgement: Decreased awareness of safety, Decreased awareness of deficits   Problem Solving: Slow processing, Decreased initiation, Requires verbal cues, Difficulty sequencing, Requires tactile cues General Comments: patient lethargic throughout session and needs stimulation for attention to task and consistent cueing for sequencing     General Comments  increased alerntess with mobility, however patient is lethargic during session    Exercises Other Exercises Other Exercises: OT facilitated ADL management with assistance as described above. Pt educated on importance of regular activity, safe transfer techniques, and DC recs t/o session.   Shoulder Instructions      Home Living Family/patient expects to be discharged to::  Skilled nursing facility                                 Additional Comments: recently at SNF for rehab. prior notes indicated patient lives at home with spouse      Prior Functioning/Environment Prior Level of Function : Needs assist             Mobility Comments: limited distance ambulation with rolling walker ADLs Comments: presume assistance requried at rehab, however, pt unable to state how much.        OT Problem List: Decreased strength;Decreased coordination;Decreased cognition;Decreased range of motion;Decreased activity tolerance;Decreased safety awareness;Impaired balance (sitting and/or standing);Decreased knowledge of use of DME or AE;Impaired UE functional use      OT Treatment/Interventions: Self-care/ADL training;Therapeutic exercise;Therapeutic activities;Cognitive remediation/compensation;Neuromuscular education;DME and/or AE instruction;Patient/family education;Balance training;Energy conservation    OT Goals(Current goals can be found in the care plan section) Acute Rehab OT Goals Patient Stated Goal: to feel better OT Goal Formulation: With patient Time For Goal Achievement: 08/16/23 Potential to Achieve Goals: Fair ADL Goals Pt Will Perform Grooming: sitting;with supervision;with set-up Pt Will Perform Upper Body Dressing: sitting;with min assist Pt Will Transfer to Toilet: stand pivot transfer;with min assist;bedside commode Pt Will Perform Toileting - Clothing Manipulation and hygiene: sitting/lateral leans;with min assist;sit to/from stand  OT Frequency: Min 1X/week    Co-evaluation  Reason for Co-Treatment: Complexity of the patient's impairments (multi-system involvement) PT goals addressed during session: Mobility/safety with mobility        AM-PAC OT "6 Clicks" Daily Activity     Outcome Measure Help from another person eating meals?: A Little Help from another person taking care of personal grooming?: A Lot Help from another  person toileting, which includes using toliet, bedpan, or urinal?: A Lot Help from another person bathing (including washing, rinsing, drying)?: A Lot Help from another person to put on and taking off regular upper body clothing?: A Lot Help from another person to put on and taking off regular lower body clothing?: A Lot 6 Click Score: 13   End of Session    Activity Tolerance: Patient limited by lethargy Patient left: in bed;with call bell/phone within reach;with bed alarm set  OT Visit Diagnosis: Other abnormalities of gait and mobility (R26.89);Muscle weakness (generalized) (M62.81);Other symptoms and signs involving cognitive function                Time: 6578-4696 OT Time Calculation (min): 18 min Charges:  OT General Charges $OT Visit: 1 Visit OT Evaluation $OT Eval Moderate Complexity: 1 Mod  Jguadalupe Opiela Smith Robert, M.S., OTR/L 08/02/23, 11:31 AM

## 2023-08-02 NOTE — Consult Note (Signed)
Consultation Note Date: 08/02/2023   Patient Name: Rachel Huber  DOB: [birth year]  MRN: [id number]  Age / Sex: 67 y.o., female  PCP: Lauro Regulus, MD Referring Physician: Delfino Lovett, MD  Reason for Consultation: Establishing goals of care   HPI/Brief Hospital Course: 67 y.o. 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  Noted 4 IP admits within last 6 months for similar presentation. Concern for medication adherence with lactulose at home and at facility. Last admit 11/6-11/11 for encephalopathy, discharged at that time to [facility] for STR.   Palliative medicine was consulted for assisting with goals of care conversations.  Subjective:  Extensive chart review has been completed prior to meeting patient including labs, vital signs, imaging, progress notes, orders, and available advanced directive documents from current and previous encounters.  Visited with Ms. Levels at her bedside. She is resting in bed with eyes open, does not acknowledge my presence in room, when addressed in attempts to gauge orientation, Ms. Cournoyer closed her eyes. No family at bedside during time of visit.  Called and spoke with husband-Michael with plans to meet at bedside later today.  Returned to bedside to meet with [name] and daughter.  Introduced myself as a Publishing rights manager as a member of the palliative care team. Explained palliative medicine is specialized medical care for people living with serious illness. It focuses on providing relief from the symptoms and stress of a serious illness. The goal is to improve quality of life for both the patient and the family.   [name] shares a brief life review. He and Ms. Carvalho have been married for over 40 years, Aundra Millet is  their only child, they have multiple grandchildren. Ms. Ranieri worked for many years a [employer] prior to her illness.  [name] shares she was diagnosed with NASH about 3 years ago and placed on the liver transplant list. Over the last several months, Ms. Buenaventura's ammonia levels have been an issue at being able to maintain and at this time she has been removed from donor list. Goal is to stabilize ammonia levels and avoid recurrent hospitalizations in order to be placed back on donor list.  We discussed patient's current illness and what it means in the larger context of patient's on-going co-morbidities. Natural disease trajectory and expectations at EOL were discussed.   [name] shares he and Ms. Schranz have discussed her wishes but have not completed AD. He understands the importance of having goals of care discussions regarding code status and long term goals. In the future Ms. Fangman has been clear with him she wishes to remain Full Code/Full Scope until her liver disease reaches end stage and transplant is no longer an option.  [name] requesting Ms. Merendino's port to be accessed for lab monitoring-notified primary team and nursing staff.  I discussed importance of continued conversations with family/support persons and all members of their medical team regarding overall plan of care and treatment options ensuring decisions are in alignment with patients goals of care.  All questions/concerns addressed. Emotional support provided to patient/family/support persons. PMT will continue to follow and support patient as needed.   Objective: Primary Diagnoses: Present on Admission:  Hepatic encephalopathy (HCC)  AMS (altered mental status)  Anemia in chronic kidney disease (CKD)  Aortic atherosclerosis (HCC)  Beta thalassemia intermedia (HCC)  Chronic kidney disease, stage 3b (HCC)  Depression with anxiety  HLD (hyperlipidemia)  Type 2 diabetes mellitus with hyperlipidemia (HCC)   Hypokalemia   Physical Exam Constitutional:      General: She is not in acute distress.    Appearance: She is ill-appearing.  Pulmonary:     Effort: Pulmonary effort is normal. No respiratory distress.  Skin:    General: Skin is warm and dry.     Coloration: Skin is jaundiced.     Findings: Bruising present.  Neurological:     Mental Status: She is alert. She is disoriented.     Motor: Weakness present.     Vital Signs: BP (!) 132/53 (BP Location: Right Arm)   Pulse 96   Temp 97.9 F (36.6 C)   Resp 18   Ht 5\' 9"  (1.753 m)   Wt 135.2 kg   SpO2 98%   BMI 44.01 kg/m  Pain Scale: 0-10   Pain Score: 0-No pain  IO: Intake/output summary:  Intake/Output Summary (Last 24 hours) at 08/02/2023 1254 Last data filed at 08/01/2023 2200 Gross per 24 hour  Intake 360 ml  Output 100 ml  Net 260 ml    LBM: Last BM Date : 07/30/23 Baseline Weight: Weight: 135.2 kg Most recent weight: Weight: 135.2 kg      Assessment and Plan  SUMMARY OF RECOMMENDATIONS   Time for outcomes Ongoing GOC discussions PMT to continue to follow for ongoing needs and support   Palliative Prophylaxis:   Bowel Regimen, Delirium Protocol and Frequent Pain Assessment  Discussed With: Primary team and nursing staff   Thank you for this consult and allowing Palliative Medicine to participate in the care of Don Broach. Palliative medicine will continue to follow and assist as needed.   Time Total: 75 minutes  Time spent includes: Detailed review of medical records (labs, imaging, vital signs), medically appropriate exam (mental status, respiratory, cardiac, skin), discussed with treatment team, counseling and educating patient, family and staff, documenting clinical information, medication management and coordination of care.   Signed by: Leeanne Deed, DNP, AGNP-C Palliative Medicine    Please contact Palliative Medicine Team phone at [phone] for questions and concerns.  For  individual provider: See Loretha Stapler

## 2023-08-02 NOTE — [place] FL2 (Signed)
[place] MEDICAID FL2 LEVEL OF CARE FORM     IDENTIFICATION  Patient Name: Rachel Huber Birthdate: 05-10-1956 Sex: female Admission Date (Current Location): 07/28/2023  [place] and [place] Number:  Chiropodist and Address:  [facility], [address] [place], [place] [address]      Provider Number: [id number]  Attending Physician Name and Address:  Delfino Lovett, MD  Relative Name and Phone Number:  Terran, Dimitriou (Spouse)  [phone]    Current Level of Care: Hospital Recommended Level of Care: Skilled Nursing Facility Prior Approval Number:    Date Approved/Denied:   PASRR Number: [id number]  Discharge Plan: SNF    Current Diagnoses: Patient Active Problem List   Diagnosis Date Noted   Obesity, Class III, BMI 40-49.9 (morbid obesity) (HCC) 07/28/2023   Hypomagnesemia 07/28/2023   Encephalopathy 06/24/2023   Beta thalassemia intermedia (HCC) 05/25/2023   Gout due to renal impairment 05/12/2023   Unspecified protein-calorie malnutrition (HCC) 05/06/2023   Hypoalbuminemia due to protein-calorie malnutrition (HCC) 05/05/2023   Vitamin D deficiency 05/05/2023   Bilateral lower extremity edema 05/05/2023   Aortic atherosclerosis (HCC) 05/05/2023   AMS (altered mental status) 04/29/2023   Hepatic encephalopathy (HCC) 03/25/2023   Acute metabolic encephalopathy 03/25/2023   Acute cystitis with hematuria 03/25/2023   Acute hepatic encephalopathy (HCC) 01/19/2023   Pancytopenia (HCC) 01/06/2023   Anemia 01/06/2023   Internal hemorrhoid 01/06/2023   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   Symptomatic anemia 12/17/2022   Hypokalemia 11/06/2022   Acute on chronic anemia 11/03/2022   Hypotension 11/03/2022   AKI (acute kidney injury) (HCC) 10/15/2022   Rectal bleeding 06/17/2022   Obesity (BMI 30-39.9) 06/17/2022   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 mellitus (HCC) 06/17/2022   Grade IV internal hemorrhoids 05/16/2022    Thrombocytopenia (HCC) 03/05/2022   Anemia in chronic kidney disease (CKD) 02/07/2022   GI bleeding 02/04/2022   Chronic kidney disease, stage 3b (HCC) 02/04/2022   Depression with anxiety 02/04/2022   Liver cirrhosis secondary to NASH (HCC)    Insomnia 01/27/2022   Iron deficiency anemia due to chronic blood loss 02/13/2021   History of uterine cancer 02/13/2021   Decompensated hepatic cirrhosis (HCC) 10/25/2020   OSA on CPAP 10/25/2020   Closed right hip fracture (HCC) 09/02/2020   Type 2 diabetes mellitus with hyperlipidemia (HCC) 09/02/2020   Thalassemia minor    HLD (hyperlipidemia)    Depression    Fall at home, initial encounter     Orientation RESPIRATION BLADDER Height & Weight     Self, Time, Situation, Place  Normal Incontinent Weight: 135.2 kg Height:  5\' 9"  (175.3 cm)  BEHAVIORAL SYMPTOMS/MOOD NEUROLOGICAL BOWEL NUTRITION STATUS      Incontinent Diet  AMBULATORY STATUS COMMUNICATION OF NEEDS Skin   Extensive Assist Verbally PU Stage and Appropriate Care PU Stage 1 Dressing: No Dressing                     Personal Care Assistance Level of Assistance  Bathing, Dressing Bathing Assistance: Maximum assistance   Dressing Assistance: Maximum assistance     Functional Limitations Info             SPECIAL CARE FACTORS FREQUENCY  PT (By licensed PT), OT (By licensed OT)     PT Frequency: 5 times a week OT Frequency: 5 times a week            Contractures Contractures Info: Not present    Additional  Factors Info  Code Status, Allergies Code Status Info: full Allergies Info: Latex Medium  Rash Contact rash  Tape Not Specified  Other (See Comments) Skin sensitivity  Gramineae Pollen           Current Medications (08/02/2023):  This is the current hospital active medication list Current Facility-Administered Medications  Medication Dose Route Frequency Provider Last Rate Last Admin   acetaminophen (TYLENOL) tablet 650 mg  650 mg Oral Q6H PRN  Cox, Amy N, DO       Or   acetaminophen (TYLENOL) suppository 650 mg  650 mg Rectal Q6H PRN Cox, Amy N, DO       butalbital-acetaminophen-caffeine (FIORICET) 50-325-40 MG per tablet 1 tablet  1 tablet Oral Q6H PRN Cox, Amy N, DO       cyanocobalamin (VITAMIN B12) tablet 1,000 mcg  1,000 mcg Oral Daily Cox, Amy N, DO   1,000 mcg at 08/02/23 1002   folic acid (FOLVITE) tablet 1 mg  1 mg Oral Daily Cox, Amy N, DO   1 mg at 08/02/23 1002   heparin injection 5,000 Units  5,000 Units Subcutaneous Q8H Cox, Amy N, DO   5,000 Units at 08/02/23 1422   lactulose (CHRONULAC) 10 GM/15ML solution 30 g  30 g Oral TID Cox, Amy N, DO   30 g at 08/02/23 1002   ondansetron (ZOFRAN) tablet 4 mg  4 mg Oral Q6H PRN Cox, Amy N, DO       Or   ondansetron (ZOFRAN) injection 4 mg  4 mg Intravenous Q6H PRN Cox, Amy N, DO   4 mg at 08/01/23 2201   rifaximin (XIFAXAN) tablet 550 mg  550 mg Oral BID Cox, Amy N, DO   550 mg at 08/02/23 1002     Discharge Medications: Please see discharge summary for a list of discharge medications.  Relevant Imaging Results:  Relevant Lab Results:   Additional Information SS#[id number]  Rodney Langton, RN

## 2023-08-02 NOTE — Evaluation (Signed)
Physical Therapy Evaluation Patient Details Name: Rachel Huber MRN: [id number] DOB: 09-06-1955 Today's Date: 08/02/2023  History of Present Illness  Patient is a 67 year old female coming from [facility] with with AMS, hepatic encephalopathy, decompensated NASH cirrhosis. History of anemia, CKD, cirrhosis, hypertension, status post TIPS procedure  Clinical Impression  Patient lethargic but with increased alertness with mobility efforts today. Per notes, patient at short term rehab. She reports she was ambulatory with rolling walker, short distances.  Today, the patient required +2 person assistance for bed mobility. Sitting balance is poor initially with periods of fair sitting balance. +2 person assistance for lateral scoot transfers. Patient is limited by fatigue and lethargy. Recommend to continue PT to maximize independence and decrease caregiver burden. Rehabilitation < 3 hours/day recommended after this hospital stay.     If plan is discharge home, recommend the following: Two people to help with walking and/or transfers;Two people to help with bathing/dressing/bathroom;Assistance with cooking/housework;Assistance with feeding;Direct supervision/assist for medications management;Direct supervision/assist for financial management;Assist for transportation;Help with stairs or ramp for entrance;Supervision due to cognitive status   Can travel by private vehicle   No    Equipment Recommendations None recommended by PT  Recommendations for Other Services       Functional Status Assessment Patient has had a recent decline in their functional status and demonstrates the ability to make significant improvements in function in a reasonable and predictable amount of time.     Precautions / Restrictions Precautions Precautions: Fall Restrictions Weight Bearing Restrictions Per Provider Order: No      Mobility  Bed Mobility Overal bed mobility: Needs Assistance Bed Mobility:  Supine to Sit, Sit to Supine     Supine to sit: Max assist, +2 for physical assistance Sit to supine: Max assist, +2 for physical assistance   General bed mobility comments: assistance for LE and trunk support. verbal cues for task initiation and sequencing    Transfers Overall transfer level: Needs assistance   Transfers: Bed to chair/wheelchair/BSC            Lateral/Scoot Transfers: Max assist, +2 physical assistance General transfer comment: +2 person assistance for lateral scooting to the right with cues for task initiation    Ambulation/Gait               General Gait Details: not attempted  Stairs            Wheelchair Mobility     Tilt Bed    Modified Rankin (Stroke Patients Only)       Balance Overall balance assessment: Needs assistance Sitting-balance support: Feet supported Sitting balance-Leahy Scale: Poor Sitting balance - Comments: intermittent external support required. periods of fair sitting balance                                     Pertinent Vitals/Pain Pain Assessment Pain Assessment: No/denies pain    Home Living Family/patient expects to be discharged to:: Skilled nursing facility                   Additional Comments: recently at SNF for rehab. prior notes indicated patient lives at home with spouse    Prior Function Prior Level of Function : Needs assist             Mobility Comments: limited distance ambulation with rolling walker ADLs Comments: presume assistance requried at rehab  Extremity/Trunk Assessment   Upper Extremity Assessment Upper Extremity Assessment: Generalized weakness;Defer to OT evaluation    Lower Extremity Assessment Lower Extremity Assessment: Generalized weakness;Difficult to assess due to impaired cognition       Communication   Communication Communication: Difficulty following commands/understanding;Difficulty communicating thoughts/reduced clarity of  speech Following commands: Follows one step commands with increased time;Follows one step commands inconsistently Cueing Techniques: Verbal cues;Gestural cues;Tactile cues;Visual cues  Cognition Arousal: Lethargic Behavior During Therapy: WFL for tasks assessed/performed Overall Cognitive Status: Impaired/Different from baseline Area of Impairment: Following commands, Problem solving, Safety/judgement                       Following Commands: Follows one step commands inconsistently, Follows one step commands with increased time Safety/Judgement: Decreased awareness of safety, Decreased awareness of deficits   Problem Solving: Slow processing, Decreased initiation, Requires verbal cues, Difficulty sequencing, Requires tactile cues General Comments: patient lethargic throughout session and needs stimulation for attention to task        General Comments General comments (skin integrity, edema, etc.): increased alerntess with mobility, however patient is lethargic during session    Exercises     Assessment/Plan    PT Assessment Patient needs continued PT services  PT Problem List Decreased strength;Decreased range of motion;Decreased activity tolerance;Decreased balance;Decreased mobility;Decreased cognition;Decreased safety awareness;Decreased knowledge of precautions       PT Treatment Interventions DME instruction;Gait training;Stair training;Functional mobility training;Therapeutic activities;Therapeutic exercise;Balance training;Neuromuscular re-education;Cognitive remediation;Patient/family education    PT Goals (Current goals can be found in the Care Plan section)  Acute Rehab PT Goals Patient Stated Goal: none stated PT Goal Formulation: Patient unable to participate in goal setting Time For Goal Achievement: 08/16/23 Potential to Achieve Goals: Fair    Frequency Min 1X/week     Co-evaluation PT/OT/SLP Co-Evaluation/Treatment: Yes Reason for Co-Treatment:  Complexity of the patient's impairments (multi-system involvement) PT goals addressed during session: Mobility/safety with mobility         AM-PAC PT "6 Clicks" Mobility  Outcome Measure Help needed turning from your back to your side while in a flat bed without using bedrails?: A Lot Help needed moving from lying on your back to sitting on the side of a flat bed without using bedrails?: Total Help needed moving to and from a bed to a chair (including a wheelchair)?: Total Help needed standing up from a chair using your arms (e.g., wheelchair or bedside chair)?: Total Help needed to walk in hospital room?: Total Help needed climbing 3-5 steps with a railing? : Total 6 Click Score: 7    End of Session   Activity Tolerance: Patient limited by fatigue;Patient limited by lethargy Patient left: in bed;with call bell/phone within reach;with bed alarm set   PT Visit Diagnosis: Muscle weakness (generalized) (M62.81);Unsteadiness on feet (R26.81)    Time: 9562-1308 PT Time Calculation (min) (ACUTE ONLY): 18 min   Charges:   PT Evaluation $PT Eval Moderate Complexity: 1 Mod   PT General Charges $$ ACUTE PT VISIT: 1 Visit         Donna Bernard, PT, MPT  [name] 08/02/2023, 9:44 AM

## 2023-08-02 NOTE — Progress Notes (Signed)
PROGRESS NOTE    Rachel Huber  MVH:[id number] DOB: 1956-05-10 DOA: 07/28/2023 PCP: Lauro Regulus, MD    Brief Narrative:  67 year old female with history of non-insulin-dependent diabetes mellitus, CKD stage IIIb, Nash liver cirrhosis with refractory ascites status post TIPS creation status post 2 revisions, thalassemia minor, history of rectal bleeding status post superior rectal artery embolization x 2 (June 2023 and March 2024), history of recurrent grade 4 internal hemorrhoids, and 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   12/12: Patient has been excepting her lactulose and having multiple bowel movements.  Mental status remains poor  12/13: No clinical status changes.  Patient is a little bit more awake.  Has been taking her p.o. lactulose. 12/15: Mental status seems close to baseline.  Waiting for PT and OT eval   Assessment & Plan:   Principal Problem:   Hepatic encephalopathy (HCC) Active Problems:   Chronic kidney disease, stage 3b (HCC)   HLD (hyperlipidemia)   Depression with anxiety   Type 2 diabetes mellitus with hyperlipidemia (HCC)   OSA on CPAP   Anemia in chronic kidney disease (CKD)   Hypokalemia   AMS (altered mental status)   Aortic atherosclerosis (HCC)   Beta thalassemia intermedia (HCC)   Obesity, Class III, BMI 40-49.9 (morbid obesity) (HCC)   Hypomagnesemia   Hepatic encephalopathy (HCC) Decompensated NASH cirrhosis Suspect secondary to patient refusal to take lactulose at nursing facility Status post lactulose 30 g p.o. one-time dose ordered by EDP Plan: Continue oral lactulose 30 g 3 times daily Titrate to 3-4 soft bowel movements daily.  If having more bowel movements or diarrhea occurs will need to scale back on lactulose dosing Continue home rifaximin Monitor mentation.  Improved today and seems close to baseline Okay for diet as tolerated  Chronic  kidney disease, stage 3b (HCC) Patient is currently at baseline renal function   Hypomagnesemia Monitor and replace as necessary   Obesity, Class III, BMI 40-49.9 (morbid obesity) (HCC) Complicating factor to overall care and prognosis   AMS (altered mental status) With history of frequent falls in setting of lactulose noncompliance Fall precautions, aspiration precaution PT OT evaluations ordered 12/14.  It is pending.  She will need evaluation before able to go back to [facility]   Hypokalemia Monitor and replace as necessary   Anemia in chronic kidney disease (CKD) Currently no clinical concerns for bleeding at this time Patient's baseline range over the last month has been 7.1-9.1 No indication for transfusion at this time   OSA on CPAP CPAP nightly ordered on admission   DVT prophylaxis: Heparin subcu Code Status: Full Family Communication: None at bedside Disposition Plan: Status is: Inpatient Remains inpatient appropriate because: Hepatic encephalopathy, decompensated hepatic cirrhosis   Level of care: Telemetry Medical  Consultants:  None  Procedures:  None  Antimicrobials: None   Subjective:  Seems close to baseline mental status  Objective: Vitals:   08/01/23 1518 08/01/23 2003 08/02/23 0546 08/02/23 0833  BP: (!) 128/58 (!) 125/54 139/62 (!) 132/53  Pulse: 76 85 87 96  Resp: 16 16 16 18   Temp: 97.7 F (36.5 C) 98.5 F (36.9 C) 97.9 F (36.6 C) 97.9 F (36.6 C)  TempSrc: Oral     SpO2: 98% 99% 96% 98%  Weight:      Height:        Intake/Output Summary (Last 24 hours) at 08/02/2023 8413 Last data filed at 08/01/2023  2200 Gross per 24 hour  Intake 840 ml  Output 100 ml  Net 740 ml    Filed Weights   07/28/23 1710  Weight: 135.2 kg    Examination:  General exam: Appears fatigued Respiratory system: Decreased breath sounds at the bases normal work of breathing.  Room air Cardiovascular system: S1-S2, RRR, no murmurs, no  pedal edema Gastrointestinal system: Soft, nontender, nondistended, normal bowel sounds Central nervous system: Alert.  Oriented to person only. Extremities: Unable to assess power.  Gait not assessed Skin: No rash or lesion Psychiatry: Judgement and insight appear impaired. Mood & affect seems at baseline    Data Reviewed: I have personally reviewed following labs and imaging studies  CBC: Recent Labs  Lab 07/28/23 1721 07/29/23 0428 07/30/23 0817 08/01/23 0807  WBC 5.4 4.7 8.0 6.2  NEUTROABS  --   --  5.4 3.5  HGB 8.0* 7.6* 8.5* 7.8*  HCT 25.9* 25.1* 27.6* 25.9*  MCV 73.6* 74.3* 74.0* 74.9*  PLT 172 167 154 156   Basic Metabolic Panel: Recent Labs  Lab 07/28/23 1720 07/28/23 1721 07/29/23 0428 07/30/23 0817 08/01/23 0807  NA  --  134* 136 139 144  K  --  3.3* 2.9* 3.8 3.5  CL  --  95* 102 103 109  CO2  --  27 26 27 27   GLUCOSE  --  194* 125* 122* 117*  BUN  --  36* 34* 33* 37*  CREATININE  --  1.26* 1.15* 1.17* 1.14*  CALCIUM  --  9.0 8.7* 9.2 9.4  MG 1.5*  --  1.6*  --   --    GFR: Estimated Creatinine Clearance: 72.9 mL/min (A) (by C-G formula based on SCr of 1.14 mg/dL (H)). Liver Function Tests: Recent Labs  Lab 07/28/23 1721 07/29/23 0428 07/30/23 0817  AST 44* 40 42*  ALT 20 19 20   ALKPHOS 105 91 98  BILITOT 3.9* 3.5* 3.5*  PROT 6.6 5.9* 6.1*  ALBUMIN 3.2* 2.9* 3.0*   Recent Labs  Lab 07/28/23 1721  LIPASE 36   Recent Labs  Lab 07/28/23 1721  AMMONIA 120*       Scheduled Meds:  cyanocobalamin  1,000 mcg Oral Daily   folic acid  1 mg Oral Daily   heparin  5,000 Units Subcutaneous Q8H   lactulose  30 g Oral TID   rifaximin  550 mg Oral BID   Continuous Infusions:     LOS: 5 days   Time spent 35 minutes Delfino Lovett, MD [facility]   If 7PM-7AM, please contact night-coverage  08/02/2023, 9:17 AM

## 2023-08-02 NOTE — TOC Progression Note (Addendum)
Transition of Care [facility]) - Progression Note    Patient Details  Name: Rachel Huber MRN: [id number] Date of Birth: 09/01/1955  Transition of Care [facility]) CM/SW Contact  Rodney Langton, RN Phone Number: 08/02/2023, 3:26 PM  Clinical Narrative:     Recommendations remain for SNF for short term rehab, reached out to Tanya to confirm patient able to go back to [facility].    Update 1648:  [name] confirmed patient can go back tomorrow, new FL2 done.   Expected Discharge Plan: Skilled Nursing Facility Barriers to Discharge: Continued Medical Work up  Expected Discharge Plan and Services                                               Social Determinants of Health (SDOH) Interventions SDOH Screenings   Food Insecurity: Patient Unable To Answer (07/29/2023)  Housing: Patient Unable To Answer (07/29/2023)  Transportation Needs: Patient Unable To Answer (07/29/2023)  Utilities: Patient Unable To Answer (07/29/2023)  Alcohol Screen: Low Risk  (04/04/2022)  Depression (PHQ2-9): Low Risk  (04/27/2023)  Financial Resource Strain: Low Risk  (06/03/2023)   Received from [facility]  Physical Activity: Inactive (04/04/2022)  Social Connections: Moderately Isolated (04/04/2022)  Stress: No Stress Concern Present (04/04/2022)  Tobacco Use: Medium Risk (07/28/2023)    Readmission Risk Interventions    04/30/2023   12:35 PM 12/19/2022   10:27 AM 11/04/2022   11:32 AM  Readmission Risk Prevention Plan  Transportation Screening Complete Complete Complete  Medication Review [employer]) Complete Complete Complete  PCP or Specialist appointment within 3-5 days of discharge Not Complete Complete Complete  HRI or Home Care Consult Complete Complete   SW Recovery Care/Counseling Consult Complete Complete Complete  Palliative Care Screening Not Applicable Not Applicable Not Applicable  Skilled Nursing Facility Not Complete Not Applicable Not Applicable

## 2023-08-03 DIAGNOSIS — F418 Other specified anxiety disorders: Secondary | ICD-10-CM | POA: Diagnosis not present

## 2023-08-03 DIAGNOSIS — R4182 Altered mental status, unspecified: Secondary | ICD-10-CM

## 2023-08-03 DIAGNOSIS — Z515 Encounter for palliative care: Secondary | ICD-10-CM | POA: Diagnosis not present

## 2023-08-03 DIAGNOSIS — E785 Hyperlipidemia, unspecified: Secondary | ICD-10-CM | POA: Diagnosis not present

## 2023-08-03 DIAGNOSIS — N1832 Chronic kidney disease, stage 3b: Secondary | ICD-10-CM | POA: Diagnosis not present

## 2023-08-03 DIAGNOSIS — K7682 Hepatic encephalopathy: Secondary | ICD-10-CM | POA: Diagnosis not present

## 2023-08-03 LAB — COMPREHENSIVE METABOLIC PANEL
ALT: 21 U/L (ref 0–44)
AST: 38 U/L (ref 15–41)
Albumin: 3 g/dL — ABNORMAL LOW (ref 3.5–5.0)
Alkaline Phosphatase: 87 U/L (ref 38–126)
Anion gap: 4 — ABNORMAL LOW (ref 5–15)
BUN: 30 mg/dL — ABNORMAL HIGH (ref 8–23)
CO2: 30 mmol/L (ref 22–32)
Calcium: 9.8 mg/dL (ref 8.9–10.3)
Chloride: 105 mmol/L (ref 98–111)
Creatinine, Ser: 0.93 mg/dL (ref 0.44–1.00)
GFR, Estimated: >60 mL/min (ref >60)
Glucose, Bld: 102 mg/dL — ABNORMAL HIGH (ref 70–99)
Potassium: 3.6 mmol/L (ref 3.5–5.1)
Sodium: 139 mmol/L (ref 135–145)
Total Bilirubin: 2.9 mg/dL — ABNORMAL HIGH (ref <1.2)
Total Protein: 6 g/dL — ABNORMAL LOW (ref 6.5–8.1)

## 2023-08-03 LAB — CBC
HCT: 25.8 % — ABNORMAL LOW (ref 36.0–46.0)
Hemoglobin: 7.9 g/dL — ABNORMAL LOW (ref 12.0–15.0)
MCH: 22.2 pg — ABNORMAL LOW (ref 26.0–34.0)
MCHC: 30.6 g/dL (ref 30.0–36.0)
MCV: 72.5 fL — ABNORMAL LOW (ref 80.0–100.0)
Platelets: 167 10*3/uL (ref 150–400)
RBC: 3.56 MIL/uL — ABNORMAL LOW (ref 3.87–5.11)
RDW: 19.6 % — ABNORMAL HIGH (ref 11.5–15.5)
WBC: 5.2 10*3/uL (ref 4.0–10.5)
nRBC: 0 % (ref 0.0–0.2)

## 2023-08-03 LAB — AMMONIA: Ammonia: 109 umol/L — ABNORMAL HIGH (ref 9–35)

## 2023-08-03 MED ORDER — CHLORHEXIDINE GLUCONATE CLOTH 2 % EX PADS
6.0000 | MEDICATED_PAD | Freq: Every day | CUTANEOUS | Status: DC
  Administered 2023-08-03 – 2023-08-17 (×15): 6 via TOPICAL

## 2023-08-03 NOTE — Progress Notes (Signed)
Palliative Care Progress Note, Assessment & Plan   Patient Name: Rachel Huber       Date: 08/03/2023 DOB: 03-26-1956  Age: 67 y.o. MRN#: [id number] Attending Physician: Delfino Lovett, MD Primary Care Physician: Lauro Regulus, MD Admit Date: 07/28/2023  Subjective: Patient is lying flat in bed and picking at her lips.  Her husband Rachel Needle is at bedside and encouraging her to leave her mouth alone.  She acknowledges my presence but has long periods between responses.  No acute complaints of pain or discomfort from patient at this time.  HPI: 67 y.o. 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   Noted 4 IP admits within last 6 months for similar presentation. Concern for medication adherence with lactulose at home and at facility. Last admit 11/6-11/11 for encephalopathy, discharged at that time to [facility] for STR.    Palliative medicine was consulted for assisting with goals of care conversations.  Summary of counseling/coordination of care: Extensive chart review completed prior to meeting patient including labs, vital signs, imaging, progress notes, orders, and available advanced directive documents from current and previous encounters.   After reviewing the patient's chart and assessing the patient at bedside, I spoke with patient and her husband in regards to symptom management and plan of care.  Patient's husband shares concern that patient is no better than when she was when she first came to the hospital.  We reviewed her lab work from today revealing an ammonia level of 109.  Additionally, I  conveyed that patient refused 2 doses of her lactulose yesterday and has not been administered her lactulose yet this morning.  Rachel Needle shares that patient should not be given the option of whether or not she takes the lactulose.  We discussed patient needs to have some level of compliance with medications and that we cannot force the medications on her.  He is requesting that lactulose be given while he is present this morning as he will be encouraging for her to take it.  He asked the patient if she would take her lactulose, she had a very long pause, and then she whispered yes.  I discussed with Rachel Needle and then adjusted patient's lactulose to earlier times and remaining at 3 times a day.  I am hopeful that offering the medication earlier in the day and not so late at night may encourage compliance from patient.  Space and opportunity provided for Rachel Needle to share his thoughts and emotions regarding in his wife's current medical situation.  He shares concern that if she does not take her lactulose and she will not get any better.  We again discussed that medications will be offered and encouraged but cannot be forced.  He shares she is concerned that patient may be discharged soon.  He says that he would like for her to be considered for [facility].  I conveyed this information to attending and TOC as they are following closely for discharge planning.  Full code and full scope remain.  PMT  will continue to follow and support patient and family throughout her hospitalization.  Physical Exam Vitals reviewed.  Constitutional:      General: She is not in acute distress.    Appearance: She is normal weight.  HENT:     Head: Normocephalic.     Mouth/Throat:     Mouth: Mucous membranes are moist.  Eyes:     Pupils: Pupils are equal, round, and reactive to light.  Pulmonary:     Effort: Pulmonary effort is normal.  Abdominal:     Palpations: Abdomen is soft.  Musculoskeletal:     Comments:  Generalized weakness  Skin:    General: Skin is warm and dry.  Neurological:     Mental Status: She is alert.  Psychiatric:        Behavior: Behavior normal.        Judgment: Judgment normal.             Total Time 35 minutes   Time spent includes: Detailed review of medical records (labs, imaging, vital signs), medically appropriate exam (mental status, respiratory, cardiac, skin), discussed with treatment team, counseling and educating patient, family and staff, documenting clinical information, medication management and coordination of care.  Samara Deist L. Bonita Quin, DNP, FNP-BC Palliative Medicine Team

## 2023-08-03 NOTE — Plan of Care (Signed)

## 2023-08-03 NOTE — Progress Notes (Signed)
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   12/12: Patient has been excepting her lactulose and having multiple bowel movements.  Mental status remains poor  12/13: No clinical status changes.  Patient is a little bit more awake.  Has been taking her p.o. lactulose. 12/15: Mental status seems close to baseline.  Waiting for PT and OT eval 12/16: PT and OT recommends SNF.  Husband refusing to take her back to [facility].  TOC working on alternative placement   Assessment & Plan:   Principal Problem:   Hepatic encephalopathy (HCC) Active Problems:   Chronic kidney disease, stage 3b (HCC)   HLD (hyperlipidemia)   Depression with anxiety   Type 2 diabetes mellitus with hyperlipidemia (HCC)   OSA on CPAP   Anemia in chronic kidney disease (CKD)   Hypokalemia   AMS (altered mental status)   Aortic atherosclerosis (HCC)   Beta thalassemia intermedia (HCC)   Obesity, Class III, BMI 40-49.9 (morbid obesity) (HCC)   Hypomagnesemia   Hepatic encephalopathy (HCC) Decompensated NASH cirrhosis Suspect secondary to patient refusal to take lactulose at nursing facility Status post lactulose 30 g p.o. one-time dose ordered by EDP Plan: Continue oral lactulose 30 g 3 times daily Titrate to 3-4 soft bowel movements daily.  If having more bowel movements or diarrhea occurs will need to scale back on lactulose  dosing Continue home rifaximin Patient refused 2 doses of lactulose yesterday.  Ammonia 109.  Husband requesting lactulose to be given earlier in the day to improve compliance.  Dose timing adjusted by palliative care team.  Chronic kidney disease, stage 3b (HCC) Patient is currently at baseline renal function   Hypomagnesemia Monitor and replace as necessary   Obesity, Class III, BMI 40-49.9 (morbid obesity) (HCC) Complicating factor to overall care and prognosis   AMS (altered mental status) With history of frequent falls in setting of lactulose noncompliance Fall precautions, aspiration precaution PT OT evaluations ordered 12/14.  It is pending.  She will need evaluation before able to go back to [facility]   Hypokalemia Monitor and replace as necessary   Anemia in chronic kidney disease (CKD) Currently no clinical concerns for bleeding at this time Patient's baseline range over the last month has been 7.1-9.1 No indication for transfusion at this time   OSA on CPAP CPAP nightly ordered on admission   DVT prophylaxis: Heparin subcu Code Status: Full Family Communication: None at bedside Disposition Plan: Status is: Inpatient Remains inpatient appropriate because: Hepatic encephalopathy, decompensated hepatic cirrhosis   Level of care: Telemetry Medical   Subjective:  Seems close to baseline mental status.  Ammonia 109.  She refused 2 doses of lactulose yesterday Objective: Vitals:   08/02/23 1449 08/02/23 2010 08/03/23 0336 08/03/23 0817  BP: (!) 114/46 (!) 112/42 (!) 117/55 (!) 119/49  Pulse: 80 83 82 71  Resp: 17 18 17 18   Temp: 98 F (36.7 C) 97.8 F (36.6 C) 98.1 F (36.7 C) 98.3 F (36.8 C)  TempSrc:      SpO2: 100% 95% 96% 93%  Weight:      Height:       No intake or output data in the 24 hours ending 08/03/23 1421   Filed Weights   07/28/23 1710  Weight: 135.2 kg    Examination:  General exam: Appears fatigued Respiratory  system: Decreased breath sounds at the bases normal work of breathing.  Room air Cardiovascular system: S1-S2, RRR, no murmurs, no pedal edema Gastrointestinal system: Soft, nontender, nondistended, normal bowel sounds Central nervous system: Alert.  Oriented to person only. Extremities: Unable to assess power.  Gait not assessed Skin: No rash or lesion Psychiatry: Judgement and insight appear impaired. Mood & affect seems at baseline    Data Reviewed: I have personally reviewed following labs and imaging studies  CBC: Recent Labs  Lab 07/28/23 1721 07/29/23 0428 07/30/23 0817 08/01/23 0807 08/03/23 0230  WBC 5.4 4.7 8.0 6.2 5.2  NEUTROABS  --   --  5.4 3.5  --   HGB 8.0* 7.6* 8.5* 7.8* 7.9*  HCT 25.9* 25.1* 27.6* 25.9* 25.8*  MCV 73.6* 74.3* 74.0* 74.9* 72.5*  PLT 172 167 154 156 167   Basic Metabolic Panel: Recent Labs  Lab 07/28/23 1720 07/28/23 1721 07/29/23 0428 07/30/23 0817 08/01/23 0807 08/03/23 0230  NA  --  134* 136 139 144 139  K  --  3.3* 2.9* 3.8 3.5 3.6  CL  --  95* 102 103 109 105  CO2  --  27 26 27 27 30   GLUCOSE  --  194* 125* 122* 117* 102*  BUN  --  36* 34* 33* 37* 30*  CREATININE  --  1.26* 1.15* 1.17* 1.14* 0.93  CALCIUM  --  9.0 8.7* 9.2 9.4 9.8  MG 1.5*  --  1.6*  --   --   --    GFR: Estimated Creatinine Clearance: 89.3 mL/min (by C-G formula based on SCr of 0.93 mg/dL). Liver Function Tests: Recent Labs  Lab 07/28/23 1721 07/29/23 0428 07/30/23 0817 08/03/23 0230  AST 44* 40 42* 38  ALT 20 19 20 21   ALKPHOS 105 91 98 87  BILITOT 3.9* 3.5* 3.5* 2.9*  PROT 6.6 5.9* 6.1* 6.0*  ALBUMIN 3.2* 2.9* 3.0* 3.0*   Recent Labs  Lab 07/28/23 1721  LIPASE 36   Recent Labs  Lab 07/28/23 1721 08/03/23 0230  AMMONIA 120* 109*       Scheduled Meds:  Chlorhexidine Gluconate Cloth  6 each Topical Daily   cyanocobalamin  1,000 mcg Oral Daily   folic acid  1 mg Oral Daily   heparin  5,000 Units Subcutaneous Q8H   lactulose  30 g  Oral TID   rifaximin  550 mg Oral BID   Continuous Infusions:     LOS: 6 days   Time spent 35 minutes Delfino Lovett, MD [facility]   If 7PM-7AM, please contact night-coverage  08/03/2023, 2:21 PM

## 2023-08-03 NOTE — TOC Progression Note (Signed)
Transition of Care [facility]) - Progression Note    Patient Details  Name: Rachel Huber MRN: [id number] Date of Birth: 1955-09-07  [facility] of [facility]) CM/SW Contact  Garret Reddish, RN Phone Number: 08/03/2023, 2:51 PM  Clinical Narrative:  Chart reviewed.  I have spoken with Mr. Orrantia.  Mr. Cathlean Sauer is adamant that he does not want [name] to return to [facility].  He has requested that I look for facilities in the [place] area and [facility] in [place].   I have completed bed search to the [facility] and [facility] in [place].  TOC will continue to follow for discharge planning.     Expected Discharge Plan: Skilled Nursing Facility Barriers to Discharge: Continued Medical Work up  Expected Discharge Plan and Services                                               Social Determinants of Health (SDOH) Interventions SDOH Screenings   Food Insecurity: Patient Unable To Answer (07/29/2023)  Housing: Patient Unable To Answer (07/29/2023)  Transportation Needs: Patient Unable To Answer (07/29/2023)  Utilities: Patient Unable To Answer (07/29/2023)  Alcohol Screen: Low Risk  (04/04/2022)  Depression (PHQ2-9): Low Risk  (04/27/2023)  Financial Resource Strain: Low Risk  (06/03/2023)   Received from [facility]  Physical Activity: Inactive (04/04/2022)  Social Connections: Moderately Isolated (04/04/2022)  Stress: No Stress Concern Present (04/04/2022)  Tobacco Use: Medium Risk (07/28/2023)    Readmission Risk Interventions    04/30/2023   12:35 PM 12/19/2022   10:27 AM 11/04/2022   11:32 AM  Readmission Risk Prevention Plan  Transportation Screening Complete Complete Complete  Medication Review Oceanographer) Complete Complete Complete  PCP or Specialist appointment within 3-5 days of discharge Not Complete Complete Complete  HRI or Home Care Consult Complete Complete   SW Recovery Care/Counseling Consult  Complete Complete Complete  Palliative Care Screening Not Applicable Not Applicable Not Applicable  Skilled Nursing Facility Not Complete Not Applicable Not Applicable

## 2023-08-04 ENCOUNTER — Inpatient Hospital Stay: Payer: Medicare Other

## 2023-08-04 DIAGNOSIS — K7682 Hepatic encephalopathy: Secondary | ICD-10-CM | POA: Diagnosis not present

## 2023-08-04 DIAGNOSIS — D561 Beta thalassemia: Secondary | ICD-10-CM | POA: Diagnosis not present

## 2023-08-04 DIAGNOSIS — E1169 Type 2 diabetes mellitus with other specified complication: Secondary | ICD-10-CM

## 2023-08-04 DIAGNOSIS — N1832 Chronic kidney disease, stage 3b: Secondary | ICD-10-CM | POA: Diagnosis not present

## 2023-08-04 DIAGNOSIS — Z515 Encounter for palliative care: Secondary | ICD-10-CM | POA: Diagnosis not present

## 2023-08-04 DIAGNOSIS — E785 Hyperlipidemia, unspecified: Secondary | ICD-10-CM

## 2023-08-04 DIAGNOSIS — F418 Other specified anxiety disorders: Secondary | ICD-10-CM | POA: Diagnosis not present

## 2023-08-04 LAB — CBC
HCT: 25.9 % — ABNORMAL LOW (ref 36.0–46.0)
Hemoglobin: 7.9 g/dL — ABNORMAL LOW (ref 12.0–15.0)
MCH: 22.6 pg — ABNORMAL LOW (ref 26.0–34.0)
MCHC: 30.5 g/dL (ref 30.0–36.0)
MCV: 74 fL — ABNORMAL LOW (ref 80.0–100.0)
Platelets: 175 10*3/uL (ref 150–400)
RBC: 3.5 MIL/uL — ABNORMAL LOW (ref 3.87–5.11)
RDW: 19.5 % — ABNORMAL HIGH (ref 11.5–15.5)
WBC: 4.5 10*3/uL (ref 4.0–10.5)
nRBC: 0 % (ref 0.0–0.2)

## 2023-08-04 LAB — BASIC METABOLIC PANEL
Anion gap: 8 (ref 5–15)
BUN: 30 mg/dL — ABNORMAL HIGH (ref 8–23)
CO2: 28 mmol/L (ref 22–32)
Calcium: 9.9 mg/dL (ref 8.9–10.3)
Chloride: 106 mmol/L (ref 98–111)
Creatinine, Ser: 1.06 mg/dL — ABNORMAL HIGH (ref 0.44–1.00)
GFR, Estimated: 58 mL/min — ABNORMAL LOW (ref >60)
Glucose, Bld: 115 mg/dL — ABNORMAL HIGH (ref 70–99)
Potassium: 3.8 mmol/L (ref 3.5–5.1)
Sodium: 142 mmol/L (ref 135–145)

## 2023-08-04 LAB — AMMONIA: Ammonia: 124 umol/L — ABNORMAL HIGH (ref 9–35)

## 2023-08-04 MED ORDER — LACTULOSE ENEMA
300.0000 mL | Freq: Two times a day (BID) | RECTAL | Status: DC
Start: 2023-08-04 — End: 2023-08-07
  Administered 2023-08-05: 300 mL via RECTAL
  Filled 2023-08-04 (×7): qty 300

## 2023-08-04 NOTE — TOC Progression Note (Signed)
Transition of Care [facility]) - Progression Note    Patient Details  Name: Rachel Huber MRN: [id number] Date of Birth: 1956-04-27  Transition of Care [facility]) CM/SW Contact  Garret Reddish, RN Phone Number: 08/04/2023, 3:53 PM  Clinical Narrative:    Chart reviewed.  Bed offers reviewed with Husband and he has requested that I leave a list of bed offers at [name]'s bedside.    TOC will continue to follow for discharge planning.    Expected Discharge Plan: Skilled Nursing Facility Barriers to Discharge: Continued Medical Work up  Expected Discharge Plan and Services                                               Social Determinants of Health (SDOH) Interventions SDOH Screenings   Food Insecurity: Patient Unable To Answer (07/29/2023)  Housing: Patient Unable To Answer (07/29/2023)  Transportation Needs: Patient Unable To Answer (07/29/2023)  Utilities: Patient Unable To Answer (07/29/2023)  Alcohol Screen: Low Risk  (04/04/2022)  Depression (PHQ2-9): Low Risk  (04/27/2023)  Financial Resource Strain: Low Risk  (06/03/2023)   Received from [facility]  Physical Activity: Inactive (04/04/2022)  Social Connections: Moderately Isolated (04/04/2022)  Stress: No Stress Concern Present (04/04/2022)  Tobacco Use: Medium Risk (07/28/2023)    Readmission Risk Interventions    04/30/2023   12:35 PM 12/19/2022   10:27 AM 11/04/2022   11:32 AM  Readmission Risk Prevention Plan  Transportation Screening Complete Complete Complete  Medication Review Oceanographer) Complete Complete Complete  PCP or Specialist appointment within 3-5 days of discharge Not Complete Complete Complete  HRI or Home Care Consult Complete Complete   SW Recovery Care/Counseling Consult Complete Complete Complete  Palliative Care Screening Not Applicable Not Applicable Not Applicable  Skilled Nursing Facility Not Complete Not Applicable Not Applicable

## 2023-08-04 NOTE — Progress Notes (Signed)
Palliative Care Progress Note, Assessment & Plan   Patient Name: Rachel Huber       Date: 08/04/2023 DOB: 05-21-1956  Age: 67 y.o. MRN#: [id number] Attending Physician: Delfino Lovett, MD Primary Care Physician: Lauro Regulus, MD Admit Date: 07/28/2023  Subjective: Patient is sitting up in bed, jaundice, and appears to be resting in no apparent distress.  She is asleep but easily awakens to my presence.  She makes eye contact but makes no verbalizations.  She nods appropriately intermittently during our discussion.  RN is at bedside.  No family or friends present during my visit.  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    12/12: Patient has been excepting her lactulose and having multiple bowel movements.  Mental status remains poor   12/13: No clinical status changes.  Patient is a little bit more awake.  Has been taking her p.o. lactulose. 12/15: Mental status seems close to baseline.  Waiting for PT and OT eval 12/16: PT and OT recommends SNF.  Husband refusing to take her back to [facility].  TOC working on alternative placement 12/17: Patient refusing lactulose at times.  Ammonia trending up    Summary of counseling/coordination of care: Extensive chart review completed prior to meeting patient including labs, vital signs, imaging,  progress notes, orders, and available advanced directive documents from current and previous encounters.   After reviewing the patient's chart and assessing the patient at bedside, I attempted to speak with patient in regards to symptom management.  She was awake and able to meet my gaze but was unable to make any vocalizations.  When asked if she was in pain she shook her head no.  When asked if she was in discomfort she shook her head no.  I discussed the use of lactulose to help with patient's mentation.  I asked if she was going to take it and she neither nodded yes nor no.  Patient unable to participate in goals of care discussions independently at this time.  After meeting with the patient, I counseled with attending Dr. Clelia Croft in regards to plan of care.  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 MRI of head ordered and currently results are pending.   PMT will continue to follow and support patient and family throughout her hospitalization.  Physical Exam Vitals reviewed.  Constitutional:      General: She is not in acute distress.    Appearance: She is normal weight.  HENT:     Mouth/Throat:     Mouth: Mucous membranes are moist.  Eyes:     Pupils: Pupils are equal, round, and  reactive to light.  Pulmonary:     Effort: Pulmonary effort is normal.  Skin:    General: Skin is warm and dry.     Coloration: Skin is jaundiced.  Neurological:     Mental Status: She is alert.  Psychiatric:        Mood and Affect: Mood normal.        Behavior: Behavior normal.        Judgment: Judgment normal.             Total Time 25 minutes   Time spent includes: Detailed review of medical records (labs, imaging, vital signs), medically appropriate exam (mental status,  respiratory, cardiac, skin), discussed with treatment team, counseling and educating patient, family and staff, documenting clinical information, medication management and coordination of care.  Samara Deist L. Bonita Quin, DNP, FNP-BC Palliative Medicine Team

## 2023-08-04 NOTE — Progress Notes (Signed)
RN able to give PO lactulose, MD ordered lactulose enema for tonight.

## 2023-08-04 NOTE — Progress Notes (Signed)
Patient arousable, drowsy, not answering questions. MD notified.

## 2023-08-04 NOTE — Progress Notes (Signed)
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   12/12: Patient has been excepting her lactulose and having multiple bowel movements.  Mental status remains poor  12/13: No clinical status changes.  Patient is a little bit more awake.  Has been taking her p.o. lactulose. 12/15: Mental status seems close to baseline.  Waiting for PT and OT eval 12/16: PT and OT recommends SNF.  Husband refusing to take her back to [facility].  TOC working on alternative placement 12/17: Patient refusing lactulose at times.  Ammonia trending up  Assessment & Plan:   Principal Problem:   Hepatic encephalopathy (HCC) Active Problems:   Chronic kidney disease, stage 3b (HCC)   HLD (hyperlipidemia)   Depression with anxiety   Type 2 diabetes mellitus with hyperlipidemia (HCC)   OSA on CPAP   Anemia in chronic kidney disease (CKD)   Hypokalemia   AMS (altered mental status)   Aortic atherosclerosis (HCC)   Beta thalassemia intermedia (HCC)   Obesity, Class III, BMI 40-49.9 (morbid obesity) (HCC)   Hypomagnesemia   Hepatic encephalopathy (HCC) Decompensated NASH cirrhosis Suspect secondary to patient refusal to take lactulose at nursing facility Status post lactulose 30 g p.o. one-time dose ordered by EDP Plan: Continue oral lactulose 30 g 3 times daily Titrate to 3-4 soft bowel movements daily.  If having more bowel  movements or diarrhea occurs will need to scale back on lactulose dosing Continue home rifaximin Patient refuses lactulose at times..  Ammonia 109->124.  Lactulose dose timing adjusted by palliative care team yesterday to improve compliance.  Chronic kidney disease, stage 3b (HCC) Patient is currently at baseline renal function   Hypomagnesemia Monitor and replace as necessary   Obesity, Class III, BMI 40-49.9 (morbid obesity) (HCC) Complicating factor to overall care and prognosis   Acute metabolic encephalopathy  with history of frequent falls in setting of lactulose noncompliance Fall precautions, aspiration precaution PT OT evaluations ordered 12/14.  It is pending.  She will need evaluation before able to go back to [facility] Her MRI of the brain in September 2024 showed - 2.2 x 1.5 cm heavily calcified meningioma in the right middle cranial fossa with some mass effect on the lateral aspect of the right temporal tip.  I discussed with husband and he shared that she had a follow-up regarding that and this was thought to be benign and nothing needed to be done I will repeat a the brain today   Hypokalemia Monitor and replace as necessary   Anemia in chronic kidney disease (CKD) Currently no clinical concerns for bleeding at this time Patient's baseline range over the last month has been 7.1-9.1 No indication for transfusion at this time   OSA on CPAP CPAP nightly ordered on admission   DVT prophylaxis: Heparin subcu Code Status: Full Family Communication: Husband updated over phone Disposition Plan: Status is: Inpatient Remains inpatient appropriate because: Hepatic encephalopathy, decompensated hepatic cirrhosis   Level of care: Telemetry Medical   Subjective:  She is sleepy today.  Ammonia 124.    Objective: Vitals:   08/03/23 1627 08/03/23 2020 08/04/23 0437 08/04/23 0958  BP: 123/70 (!) 111/49 (!) 111/48 (!) 114/58  Pulse: 77 84 82 84  Resp: 19 18  18 17   Temp: 98.1 F (36.7 C) (!) 97.5 F (36.4 C) 97.6 F (36.4 C) 97.8 F (36.6 C)  TempSrc: Oral     SpO2: 100% 94% 98% 99%  Weight:      Height:        Intake/Output Summary (Last 24 hours) at 08/04/2023 1012 Last data filed at 08/04/2023 0600 Gross per 24 hour  Intake 240 ml  Output --  Net 240 ml     Filed Weights   07/28/23 1710  Weight: 135.2 kg    Examination:  General exam: Appears fatigued Respiratory system: Decreased breath sounds at the bases normal work of breathing.  Room air Cardiovascular system: S1-S2, RRR, no murmurs, no pedal edema Gastrointestinal system: Soft, nontender, nondistended, normal bowel sounds Central nervous system: She is sleepy and barely arousable to deep painful stimuli Extremities: Unable to assess power.  Gait not assessed Skin: No rash or lesion Psychiatry: Unable to assess due to her mental status    Data Reviewed: I have personally reviewed following labs and imaging studies  CBC: Recent Labs  Lab 07/29/23 0428 07/30/23 0817 08/01/23 0807 08/03/23 0230 08/04/23 0430  WBC 4.7 8.0 6.2 5.2 4.5  NEUTROABS  --  5.4 3.5  --   --   HGB 7.6* 8.5* 7.8* 7.9* 7.9*  HCT 25.1* 27.6* 25.9* 25.8* 25.9*  MCV 74.3* 74.0* 74.9* 72.5* 74.0*  PLT 167 154 156 167 175   Basic Metabolic Panel: Recent Labs  Lab 07/28/23 1720 07/28/23 1721 07/29/23 0428 07/30/23 0817 08/01/23 0807 08/03/23 0230 08/04/23 0430  NA  --    < > 136 139 144 139 142  K  --    < > 2.9* 3.8 3.5 3.6 3.8  CL  --    < > 102 103 109 105 106  CO2  --    < > 26 27 27 30 28   GLUCOSE  --    < > 125* 122* 117* 102* 115*  BUN  --    < > 34* 33* 37* 30* 30*  CREATININE  --    < > 1.15* 1.17* 1.14* 0.93 1.06*  CALCIUM  --    < > 8.7* 9.2 9.4 9.8 9.9  MG 1.5*  --  1.6*  --   --   --   --    < > = values in this interval not displayed.   GFR: Estimated Creatinine Clearance: 78.4 mL/min (A) (by C-G formula based on SCr of 1.06 mg/dL (H)). Liver Function  Tests: Recent Labs  Lab 07/28/23 1721 07/29/23 0428 07/30/23 0817 08/03/23 0230  AST 44* 40 42* 38  ALT 20 19 20 21   ALKPHOS 105 91 98 87  BILITOT 3.9* 3.5* 3.5* 2.9*  PROT 6.6 5.9* 6.1* 6.0*  ALBUMIN 3.2* 2.9* 3.0* 3.0*   Recent Labs  Lab 07/28/23 1721  LIPASE 36   Recent Labs  Lab 07/28/23 1721 08/03/23 0230 08/04/23 0430  AMMONIA 120* 109* 124*       Scheduled Meds:  Chlorhexidine Gluconate Cloth  6 each Topical Daily   cyanocobalamin  1,000 mcg Oral Daily   folic acid  1 mg Oral Daily   heparin  5,000 Units Subcutaneous Q8H   lactulose  30 g  Oral TID   rifaximin  550 mg Oral BID   Continuous Infusions:     LOS: 7 days   Time spent 35 minutes Delfino Lovett, MD [facility]   If 7PM-7AM, please contact night-coverage  08/04/2023, 10:12 AM

## 2023-08-04 NOTE — Plan of Care (Signed)
  Problem: Elimination: Goal: Will not experience complications related to bowel motility Outcome: Progressing   Problem: Pain Management: Goal: General experience of comfort will improve Outcome: Progressing

## 2023-08-05 DIAGNOSIS — R4182 Altered mental status, unspecified: Secondary | ICD-10-CM | POA: Diagnosis not present

## 2023-08-05 DIAGNOSIS — K7682 Hepatic encephalopathy: Secondary | ICD-10-CM | POA: Diagnosis not present

## 2023-08-05 DIAGNOSIS — N1832 Chronic kidney disease, stage 3b: Secondary | ICD-10-CM | POA: Diagnosis not present

## 2023-08-05 DIAGNOSIS — Z515 Encounter for palliative care: Secondary | ICD-10-CM | POA: Diagnosis not present

## 2023-08-05 DIAGNOSIS — F418 Other specified anxiety disorders: Secondary | ICD-10-CM | POA: Diagnosis not present

## 2023-08-05 DIAGNOSIS — D561 Beta thalassemia: Secondary | ICD-10-CM | POA: Diagnosis not present

## 2023-08-05 LAB — AMMONIA: Ammonia: 94 umol/L — ABNORMAL HIGH (ref 9–35)

## 2023-08-05 NOTE — Progress Notes (Signed)
Physical Therapy Treatment Patient Details Name: Rachel Huber MRN: [id number] DOB: November 08, 1955 Today's Date: 08/05/2023   History of Present Illness Patient is a 67 year old female coming from [place] with with AMS, hepatic encephalopathy, decompensated NASH cirrhosis. History of anemia, CKD, cirrhosis, hypertension, status post TIPS procedure    PT Comments  Upon PT arrival, patient requesting to use bathroom. Nursing present in room. With use of STEDY, completed STS from recliner with Mod to Max +2, single step commands for hand placement, with intermittent tactile cues. Used STEDY to transfer from recliner > toilet, toilet > bed with +2 assist. Total A for peri care after bowel movement. Patient left supine in bed at end of session, with patient's husband nursing remain at bedside. Patient will continue to benefit from skilled acute PT services to address impairments. Will continue to follow acutely.    If plan is discharge home, recommend the following: Two people to help with walking and/or transfers;Two people to help with bathing/dressing/bathroom;Assistance with cooking/housework;Assistance with feeding;Direct supervision/assist for medications management;Direct supervision/assist for financial management;Assist for transportation;Help with stairs or ramp for entrance;Supervision due to cognitive status   Can travel by private vehicle     No  Equipment Recommendations  None recommended by PT    Recommendations for Other Services       Precautions / Restrictions Precautions Precautions: Fall Restrictions Weight Bearing Restrictions Per Provider Order: No     Mobility  Bed Mobility Overal bed mobility: Needs Assistance Bed Mobility: Sit to Supine     Supine to sit: Max assist, +2 for physical assistance     General bed mobility comments: assistance for LE and trunk support. +2 assist required.    Transfers Overall transfer level: Needs assistance    Transfers: Sit to/from Stand, Bed to chair/wheelchair/BSC Sit to Stand: Max assist, +2 physical assistance           General transfer comment: with use of STEDY, Max A +2 to stand, cues for hand placment. Completed STEDY to transfer recliner > toilet. Total A for pericare after bowel movement. Then STEDY transfer +2 assist to transfer from toilet > bed. Transfer via Lift Equipment: Stedy  Ambulation/Gait                   Stairs             Wheelchair Mobility     Tilt Bed    Modified Rankin (Stroke Patients Only)       Balance Overall balance assessment: Needs assistance Sitting-balance support: Feet supported Sitting balance-Leahy Scale: Fair                                      Cognition Arousal: Lethargic Behavior During Therapy: WFL for tasks assessed/performed Overall Cognitive Status: Impaired/Different from baseline                   Orientation Level: Disoriented to, Time, Situation     Following Commands: Follows one step commands inconsistently, Follows one step commands with increased time Safety/Judgement: Decreased awareness of safety, Decreased awareness of deficits     General Comments: multimodal cues frequently required, single step commands and increased time for processing        Exercises      General Comments        Pertinent Vitals/Pain Pain Assessment Pain Assessment: No/denies pain    Home Living  Prior Function            PT Goals (current goals can now be found in the care plan section) Acute Rehab PT Goals PT Goal Formulation: Patient unable to participate in goal setting Time For Goal Achievement: 08/16/23 Potential to Achieve Goals: Fair Progress towards PT goals: Progressing toward goals    Frequency    Min 1X/week      PT Plan      Co-evaluation              AM-PAC PT "6 Clicks" Mobility   Outcome Measure  Help needed  turning from your back to your side while in a flat bed without using bedrails?: A Lot Help needed moving from lying on your back to sitting on the side of a flat bed without using bedrails?: Total Help needed moving to and from a bed to a chair (including a wheelchair)?: Total Help needed standing up from a chair using your arms (e.g., wheelchair or bedside chair)?: Total Help needed to walk in hospital room?: Total Help needed climbing 3-5 steps with a railing? : Total 6 Click Score: 7    End of Session Equipment Utilized During Treatment: Gait belt Activity Tolerance: Patient limited by fatigue;Patient limited by lethargy Patient left: in bed;with call bell/phone within reach;with bed alarm set Nurse Communication: Mobility status PT Visit Diagnosis: Muscle weakness (generalized) (M62.81);Unsteadiness on feet (R26.81)     Time: 8657-8469 PT Time Calculation (min) (ACUTE ONLY): 18 min  Charges:    $Therapeutic Activity: 8-22 mins PT General Charges $$ ACUTE PT VISIT: 1 Visit                     Howie Ill, PT, DPT 08/05/23 1:56 PM

## 2023-08-05 NOTE — TOC Progression Note (Signed)
Transition of Care [facility]) - Progression Note    Patient Details  Name: Rachel Huber MRN: [id number] Date of Birth: 1956/04/22  [facility] CM/SW Contact  Garret Reddish, RN Phone Number: 08/05/2023, 2:24 PM  Clinical Narrative:    Chart reviewed.  I have spoken with Mr. Sayward and Mrs. Barrientes have chosen [facility].  I have spoken with [place] Admission Coordinator with [facility].  She informs me that she is able to accept patient to [facility] on tomorrow.  I have arranged ambulance transport with [facility] for 1130 am on 08-06-2023.  TOC will continue to follow for discharge planning.     Expected Discharge Plan: Skilled Nursing Facility Barriers to Discharge: Continued Medical Work up  Expected Discharge Plan and Services                                               Social Determinants of Health (SDOH) Interventions SDOH Screenings   Food Insecurity: Patient Unable To Answer (07/29/2023)  Housing: Patient Unable To Answer (07/29/2023)  Transportation Needs: Patient Unable To Answer (07/29/2023)  Utilities: Patient Unable To Answer (07/29/2023)  Alcohol Screen: Low Risk  (04/04/2022)  Depression (PHQ2-9): Low Risk  (04/27/2023)  Financial Resource Strain: Low Risk  (06/03/2023)   Received from [facility]  Physical Activity: Inactive (04/04/2022)  Social Connections: Moderately Isolated (04/04/2022)  Stress: No Stress Concern Present (04/04/2022)  Tobacco Use: Medium Risk (07/28/2023)    Readmission Risk Interventions    04/30/2023   12:35 PM 12/19/2022   10:27 AM 11/04/2022   11:32 AM  Readmission Risk Prevention Plan  Transportation Screening Complete Complete Complete  Medication Review Oceanographer) Complete Complete Complete  PCP or Specialist appointment within 3-5 days of discharge Not Complete Complete Complete  HRI or Home Care Consult Complete Complete   SW Recovery Care/Counseling Consult  Complete Complete Complete  Palliative Care Screening Not Applicable Not Applicable Not Applicable  Skilled Nursing Facility Not Complete Not Applicable Not Applicable

## 2023-08-05 NOTE — Progress Notes (Signed)
Palliative Care Progress Note, Assessment & Plan   Patient Name: Rachel Huber       Date: 08/05/2023 DOB: 12-30-1955  Age: 67 y.o. MRN#: [id number] Attending Physician: Delfino Lovett, MD Primary Care Physician: Lauro Regulus, MD Admit Date: 07/28/2023  Subjective: Patient is out of bed and sitting in the recliner.  She acknowledges my presence.  Her responses are delayed but appropriate.  Her husband is at bedside during my visit.  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    12/12: Patient has been excepting her lactulose and having multiple bowel movements.  Mental status remains poor 12/13: No clinical status changes.  Patient is a little bit more awake.  Has been taking her p.o. lactulose. 12/15: Mental status seems close to baseline.  Waiting for PT and OT eval 12/16: PT and OT recommends SNF.  Husband refusing to take her back to [facility].  TOC working on alternative placement 12/17: Patient refusing lactulose at times.  Ammonia trending up  Summary of counseling/coordination of care: Extensive chart review completed prior to meeting patient including labs, vital signs, imaging, progress notes, orders, and available advanced directive documents from current and previous encounters.   After reviewing the patient's chart and assessing  the patient at bedside, with patient and her husband in regards to symptom management and next steps.  Symptoms assessed.  Patient denies pain, discomfort, headache, or other acute issues at this time.  She shares she needs to go to the restroom.  RN notified.  No adjustment to [facility] needed at this time.  Extensive discussion with husband in regards to ammonia and liver disease.  He remains hopeful that she will continue to take her lactulose and that her mentation will come back to baseline.  Education provided on end-stage liver disease, treatment with lactulose/rifaximin, human mortality, and quality of life versus quantity of life.  Space and opportunity provided for husband to ask questions.  Therapeutic silence, active listening, and emotional support provided.  I attempted to confirm patient's wishes.  Patient and husband in agreement that patient is accepting of all offered, available, and appropriate medical interventions to sustain her life.  Full code and full scope remain.  No further acute palliative needs at this time.  Patient and husband made aware to reengage with PMT if goals/wishes change or if patient's health deteriorates during hospitalization.  PMT remains available to patient and family throughout her hospitalization.  PMT will monitor her peripherally and intervene if/when appropriate.  Physical Exam Vitals reviewed.  Constitutional:      General: She is not in acute distress.    Appearance: She is normal weight.  HENT:     Nose: Nose normal.     Mouth/Throat:     Mouth: Mucous membranes are moist.  Eyes:     Pupils: Pupils  are equal, round, and reactive to light.  Cardiovascular:     Rate and Rhythm: Normal rate.     Pulses: Normal pulses.  Abdominal:     Palpations: Abdomen is soft.  Skin:    Coloration: Skin is jaundiced.  Neurological:     Mental Status: She is alert.  Psychiatric:        Mood and Affect: Mood normal.        Behavior: Behavior normal.              Total Time 25 minutes   Time spent includes: Detailed review of medical records (labs, imaging, vital signs), medically appropriate exam (mental status, respiratory, cardiac, skin), discussed with treatment team, counseling and educating patient, family and staff, documenting clinical information, medication management and coordination of care.  Samara Deist L. Bonita Quin, DNP, FNP-BC Palliative Medicine Team

## 2023-08-05 NOTE — Progress Notes (Signed)
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   12/12: Patient has been excepting her lactulose and having multiple bowel movements.  Mental status remains poor  12/13: No clinical status changes.  Patient is a little bit more awake.  Has been taking her p.o. lactulose. 12/15: Mental status seems close to baseline.  Waiting for PT and OT eval 12/16: PT and OT recommends SNF.  Husband refusing to take her back to [facility].  TOC working on alternative placement 12/17: Patient refusing lactulose at times.  Ammonia trending up.  MRI of the brain shows no acute pathology 12/18: Lactulose enema was given as an option yesterday for oral lactulose when she is not able to take.  Ammonia 94  Assessment & Plan:   Principal Problem:   Hepatic encephalopathy (HCC) Active Problems:   Chronic kidney disease, stage 3b (HCC)   HLD (hyperlipidemia)   Depression with anxiety   Type 2 diabetes mellitus with hyperlipidemia (HCC)   OSA on CPAP   Anemia in chronic kidney disease (CKD)   Hypokalemia   AMS (altered mental status)   Aortic atherosclerosis (HCC)   Beta thalassemia intermedia (HCC)   Obesity, Class III, BMI 40-49.9 (morbid obesity) (HCC)   Hypomagnesemia   Hepatic encephalopathy (HCC) Decompensated NASH cirrhosis Suspect secondary to patient refusal to take lactulose at nursing facility Continue oral  lactulose 30 g 3 times daily Titrate to 3-4 soft bowel movements daily.  If having more bowel movements or diarrhea occurs will need to scale back on lactulose dosing Continue home rifaximin Patient refuses lactulose at times..  Ammonia 109->124-> 94.  Lactulose dose timing adjusted by palliative care team yesterday to improve compliance.  Chronic kidney disease, stage 3b (HCC) Patient is currently at baseline renal function   Hypomagnesemia Monitor and replace as necessary   Obesity, Class III, BMI 40-49.9 (morbid obesity) (HCC) Complicating factor to overall care and prognosis   Acute metabolic encephalopathy  with history of frequent falls in setting of lactulose noncompliance Fall precautions, aspiration precaution PT OT evaluations ordered 12/14.  It is pending.  She will need evaluation before able to go back to [facility] Her MRI of the brain in September 2024 showed - 2.2 x 1.5 cm heavily calcified meningioma in the right middle cranial fossa with some mass effect on the lateral aspect of the right temporal tip.  I discussed with husband and he shared that she had a follow-up regarding that and this was thought to be benign and nothing needed to be done MRI brain is unchanged from before   Hypokalemia Monitor and replace as necessary   Anemia in chronic kidney disease (CKD) Currently no clinical concerns for bleeding at this time Patient's baseline range over the last month has been 7.1-9.1 No indication for transfusion at this time   OSA on CPAP CPAP nightly ordered on admission   DVT prophylaxis: Heparin subcu Code Status: Full Family Communication: Husband updated over phone Disposition Plan: Status is: Inpatient Remains  inpatient appropriate because: Hepatic encephalopathy, decompensated hepatic cirrhosis   Level of care: Telemetry Medical   Subjective:  She is sitting in the chair and much more awake.  Objective: Vitals:   08/04/23 1646 08/04/23  2009 08/05/23 0607 08/05/23 0744  BP: (!) 115/43 110/68 (!) 132/109 (!) 113/56  Pulse: 81 84 76 80  Resp: 18  20 19   Temp: 98.2 F (36.8 C) 97.7 F (36.5 C) 97.8 F (36.6 C) (!) 97.5 F (36.4 C)  TempSrc: Axillary     SpO2: 95% 97% 96% 95%  Weight:      Height:        Intake/Output Summary (Last 24 hours) at 08/05/2023 1244 Last data filed at 08/05/2023 0600 Gross per 24 hour  Intake 360 ml  Output --  Net 360 ml     Filed Weights   07/28/23 1710  Weight: 135.2 kg    Examination:  General exam: Appears fatigued Respiratory system: Decreased breath sounds at the bases normal work of breathing.  Room air Cardiovascular system: S1-S2, RRR, no murmurs, no pedal edema Gastrointestinal system: Soft, nontender, nondistended, normal bowel sounds Central nervous system: She is awake and sitting in the chair Extremities: Unable to assess power.  Gait not assessed Skin: No rash or lesion Psychiatry: At baseline    Data Reviewed: I have personally reviewed following labs and imaging studies  CBC: Recent Labs  Lab 07/30/23 0817 08/01/23 0807 08/03/23 0230 08/04/23 0430  WBC 8.0 6.2 5.2 4.5  NEUTROABS 5.4 3.5  --   --   HGB 8.5* 7.8* 7.9* 7.9*  HCT 27.6* 25.9* 25.8* 25.9*  MCV 74.0* 74.9* 72.5* 74.0*  PLT 154 156 167 175   Basic Metabolic Panel: Recent Labs  Lab 07/30/23 0817 08/01/23 0807 08/03/23 0230 08/04/23 0430  NA 139 144 139 142  K 3.8 3.5 3.6 3.8  CL 103 109 105 106  CO2 27 27 30 28   GLUCOSE 122* 117* 102* 115*  BUN 33* 37* 30* 30*  CREATININE 1.17* 1.14* 0.93 1.06*  CALCIUM 9.2 9.4 9.8 9.9   GFR: Estimated Creatinine Clearance: 78.4 mL/min (A) (by C-G formula based on SCr of 1.06 mg/dL (H)). Liver Function Tests: Recent Labs  Lab 07/30/23 0817 08/03/23 0230  AST 42* 38  ALT 20 21  ALKPHOS 98 87  BILITOT 3.5* 2.9*  PROT 6.1* 6.0*  ALBUMIN 3.0* 3.0*   No results for input(s): "LIPASE", "AMYLASE" in the last 168 hours.  Recent Labs   Lab 08/03/23 0230 08/04/23 0430 08/05/23 0421  AMMONIA 109* 124* 94*       Scheduled Meds:  Chlorhexidine Gluconate Cloth  6 each Topical Daily   cyanocobalamin  1,000 mcg Oral Daily   folic acid  1 mg Oral Daily   heparin  5,000 Units Subcutaneous Q8H   lactulose  30 g Oral TID   lactulose  300 mL Rectal BID   rifaximin  550 mg Oral BID   Continuous Infusions:     LOS: 8 days   Time spent 35 minutes Delfino Lovett, MD [facility]   If 7PM-7AM, please contact night-coverage  08/05/2023, 12:44 PM

## 2023-08-05 NOTE — Progress Notes (Signed)
Occupational Therapy Treatment Patient Details Name: Rachel Huber MRN: [id number] DOB: 01/05/1956 Today's Date: 08/05/2023   History of present illness Patient is a 67 year old female coming from [place] with with AMS, hepatic encephalopathy, decompensated NASH cirrhosis. History of anemia, CKD, cirrhosis, hypertension, status post TIPS procedure   OT comments  Chart reviewed to date, pt greeted sitting long sit in bed, reporting she wants to get out of bed. Pt is agreeable to OT tx session targeting improving functional activity tolerance and ADL performance. MOD-MAX A +2 required for STS with step by step multi modal cues multiple attempts in [place]. MAX A required for toileting, MAX A for LB dressing. Pt direction following and participation appears improved as compared to previous attempts however pt continues to present with functional deficits affecting optimal Adl performance. Pt is left in bedside chair, safety maintained, NT present. OT will follow acutely.       If plan is discharge home, recommend the following:  Two people to help with walking and/or transfers;Two people to help with bathing/dressing/bathroom;Assistance with cooking/housework;Assist for transportation;Help with stairs or ramp for entrance;Direct supervision/assist for medications management;Supervision due to cognitive status   Equipment Recommendations  Other (comment) (defer to next venue of care)    Recommendations for Other Services      Precautions / Restrictions Precautions Precautions: Fall Restrictions Weight Bearing Restrictions Per Provider Order: No       Mobility Bed Mobility Overal bed mobility: Needs Assistance Bed Mobility: Supine to Sit     Supine to sit: Mod assist, +2 for physical assistance, HOB elevated          Transfers Overall transfer level: Needs assistance                   Transfer via Lift Equipment: Stedy +2   Balance Overall balance  assessment: Needs assistance Sitting-balance support: Feet supported Sitting balance-Leahy Scale: Fair       Standing balance-Leahy Scale: Zero                             ADL either performed or assessed with clinical judgement   ADL Overall ADL's : Needs assistance/impaired             Lower Body Bathing: Maximal assistance;Sit to/from stand   Upper Body Dressing : Maximal assistance;Sitting Upper Body Dressing Details (indicate cue type and reason): anticipate Lower Body Dressing: Maximal assistance Lower Body Dressing Details (indicate cue type and reason): socks Toilet Transfer: Moderate assistance;Maximal assistance +2 Toilet Transfer Details (indicate cue type and reason): STS using [place] stedy Toileting- Clothing Manipulation and Hygiene: Maximal assistance;Sit to/from stand              Extremity/Trunk Assessment              Vision       Perception     Praxis      Cognition Arousal: Lethargic Behavior During Therapy: WFL for tasks assessed/performed Overall Cognitive Status: Impaired/Different from baseline Area of Impairment: Following commands, Problem solving, Safety/judgement, Orientation, Attention, Memory, Awareness                 Orientation Level: Disoriented to, Time, Situation Current Attention Level: Focused Memory: Decreased short-term memory Following Commands: Follows one step commands inconsistently, Follows one step commands with increased time Safety/Judgement: Decreased awareness of safety, Decreased awareness of deficits Awareness: Intellectual Problem Solving: Slow processing, Decreased initiation, Requires  verbal cues, Difficulty sequencing, Requires tactile cues General Comments: frequent multi modal cues for simple one step directions        Exercises Other Exercises Other Exercises: edu re: role of OT, importance of progressing mobility    Shoulder Instructions       General Comments       Pertinent Vitals/ Pain       Pain Assessment Pain Assessment: No/denies pain  Home Living                                          Prior Functioning/Environment              Frequency  Min 1X/week        Progress Toward Goals  OT Goals(current goals can now be found in the care plan section)  Progress towards OT goals: Progressing toward goals  Acute Rehab OT Goals Time For Goal Achievement: 08/16/23  Plan      Co-evaluation                 AM-PAC OT "6 Clicks" Daily Activity     Outcome Measure   Help from another person eating meals?: A Little Help from another person taking care of personal grooming?: A Lot Help from another person toileting, which includes using toliet, bedpan, or urinal?: A Lot Help from another person bathing (including washing, rinsing, drying)?: A Lot Help from another person to put on and taking off regular upper body clothing?: A Lot Help from another person to put on and taking off regular lower body clothing?: A Lot 6 Click Score: 13    End of Session Equipment Utilized During Treatment: Other (comment) (stedy)  OT Visit Diagnosis: Other abnormalities of gait and mobility (R26.89);Muscle weakness (generalized) (M62.81);Other symptoms and signs involving cognitive function   Activity Tolerance Patient tolerated treatment well   Patient Left in chair;with call bell/phone within reach;with chair alarm set   Nurse Communication Mobility status        Time: 1030-1051 OT Time Calculation (min): 21 min  Charges: OT General Charges $OT Visit: 1 Visit OT Treatments $Self Care/Home Management : 8-22 mins  Oleta Mouse, OTD OTR/L  08/05/23, 12:04 PM

## 2023-08-06 ENCOUNTER — Inpatient Hospital Stay: Payer: Medicare Other

## 2023-08-06 ENCOUNTER — Encounter
Admission: EM | Disposition: A | Discharge status: Hospice | Payer: Self-pay | Source: Home / Self Care | Attending: Internal Medicine

## 2023-08-06 ENCOUNTER — Inpatient Hospital Stay: Payer: Medicare Other | Admitting: Anesthesiology

## 2023-08-06 ENCOUNTER — Other Ambulatory Visit: Payer: Self-pay

## 2023-08-06 DIAGNOSIS — Z515 Encounter for palliative care: Secondary | ICD-10-CM | POA: Diagnosis not present

## 2023-08-06 DIAGNOSIS — S72142S Displaced intertrochanteric fracture of left femur, sequela: Secondary | ICD-10-CM

## 2023-08-06 DIAGNOSIS — S72002A Fracture of unspecified part of neck of left femur, initial encounter for closed fracture: Secondary | ICD-10-CM

## 2023-08-06 DIAGNOSIS — N1832 Chronic kidney disease, stage 3b: Secondary | ICD-10-CM | POA: Diagnosis not present

## 2023-08-06 DIAGNOSIS — R17 Unspecified jaundice: Secondary | ICD-10-CM

## 2023-08-06 DIAGNOSIS — K7682 Hepatic encephalopathy: Secondary | ICD-10-CM | POA: Diagnosis not present

## 2023-08-06 DIAGNOSIS — F418 Other specified anxiety disorders: Secondary | ICD-10-CM | POA: Diagnosis not present

## 2023-08-06 HISTORY — PX: INTRAMEDULLARY (IM) NAIL INTERTROCHANTERIC: SHX5875

## 2023-08-06 LAB — HEPATIC FUNCTION PANEL
ALT: 30 U/L (ref 0–44)
AST: 50 U/L — ABNORMAL HIGH (ref 15–41)
Albumin: 2.9 g/dL — ABNORMAL LOW (ref 3.5–5.0)
Alkaline Phosphatase: 97 U/L (ref 38–126)
Bilirubin, Direct: 0.8 mg/dL — ABNORMAL HIGH (ref 0.0–0.2)
Indirect Bilirubin: 2.7 mg/dL — ABNORMAL HIGH (ref 0.3–0.9)
Total Bilirubin: 3.5 mg/dL — ABNORMAL HIGH (ref <1.2)
Total Protein: 6.2 g/dL — ABNORMAL LOW (ref 6.5–8.1)

## 2023-08-06 LAB — CBC
HCT: 24.7 % — ABNORMAL LOW (ref 36.0–46.0)
Hemoglobin: 7.5 g/dL — ABNORMAL LOW (ref 12.0–15.0)
MCH: 22.3 pg — ABNORMAL LOW (ref 26.0–34.0)
MCHC: 30.4 g/dL (ref 30.0–36.0)
MCV: 73.3 fL — ABNORMAL LOW (ref 80.0–100.0)
Platelets: 164 10*3/uL (ref 150–400)
RBC: 3.37 MIL/uL — ABNORMAL LOW (ref 3.87–5.11)
RDW: 19 % — ABNORMAL HIGH (ref 11.5–15.5)
WBC: 8 10*3/uL (ref 4.0–10.5)
nRBC: 0 % (ref 0.0–0.2)

## 2023-08-06 LAB — BASIC METABOLIC PANEL
Anion gap: 15 (ref 5–15)
BUN: 32 mg/dL — ABNORMAL HIGH (ref 8–23)
CO2: 21 mmol/L — ABNORMAL LOW (ref 22–32)
Calcium: 10.6 mg/dL — ABNORMAL HIGH (ref 8.9–10.3)
Chloride: 106 mmol/L (ref 98–111)
Creatinine, Ser: 1.23 mg/dL — ABNORMAL HIGH (ref 0.44–1.00)
GFR, Estimated: 49 mL/min — ABNORMAL LOW (ref >60)
Glucose, Bld: 190 mg/dL — ABNORMAL HIGH (ref 70–99)
Potassium: 3.5 mmol/L (ref 3.5–5.1)
Sodium: 142 mmol/L (ref 135–145)

## 2023-08-06 LAB — AMMONIA: Ammonia: 77 umol/L — ABNORMAL HIGH (ref 9–35)

## 2023-08-06 LAB — GLUCOSE, CAPILLARY
Glucose-Capillary: 198 mg/dL — ABNORMAL HIGH (ref 70–99)
Glucose-Capillary: 250 mg/dL — ABNORMAL HIGH (ref 70–99)

## 2023-08-06 LAB — PROTIME-INR
INR: 1.4 — ABNORMAL HIGH (ref 0.8–1.2)
Prothrombin Time: 17.5 s — ABNORMAL HIGH (ref 11.4–15.2)

## 2023-08-06 LAB — APTT: aPTT: 33 s (ref 24–36)

## 2023-08-06 SURGERY — FIXATION, FRACTURE, INTERTROCHANTERIC, WITH INTRAMEDULLARY ROD
Anesthesia: General | Laterality: Left

## 2023-08-06 MED ORDER — FENTANYL CITRATE (PF) 100 MCG/2ML IJ SOLN
INTRAMUSCULAR | Status: DC | PRN
  Administered 2023-08-06: 100 ug via INTRAVENOUS

## 2023-08-06 MED ORDER — ROCURONIUM BROMIDE 100 MG/10ML IV SOLN
INTRAVENOUS | Status: DC | PRN
  Administered 2023-08-06: 50 mg via INTRAVENOUS

## 2023-08-06 MED ORDER — SODIUM CHLORIDE 0.9 % IV SOLN
3.0000 g | INTRAVENOUS | Status: AC
  Administered 2023-08-06: 3 g via INTRAVENOUS
  Filled 2023-08-06: qty 3

## 2023-08-06 MED ORDER — ONDANSETRON HCL 4 MG/2ML IJ SOLN
INTRAMUSCULAR | Status: DC | PRN
  Administered 2023-08-06: 4 mg via INTRAVENOUS

## 2023-08-06 MED ORDER — SUGAMMADEX SODIUM 200 MG/2ML IV SOLN
INTRAVENOUS | Status: DC | PRN
  Administered 2023-08-06: 200 mg via INTRAVENOUS

## 2023-08-06 MED ORDER — HYDROMORPHONE HCL 1 MG/ML IJ SOLN
INTRAMUSCULAR | Status: DC | PRN
  Administered 2023-08-06: .5 mg via INTRAVENOUS

## 2023-08-06 MED ORDER — PROPOFOL 10 MG/ML IV BOLUS
INTRAVENOUS | Status: DC | PRN
  Administered 2023-08-06: 100 mg via INTRAVENOUS

## 2023-08-06 MED ORDER — ASPIRIN 325 MG PO TABS
325.0000 mg | ORAL_TABLET | Freq: Every day | ORAL | Status: DC
  Filled 2023-08-06 (×2): qty 1

## 2023-08-06 MED ORDER — PROPOFOL 1000 MG/100ML IV EMUL
INTRAVENOUS | Status: AC
Start: 2023-08-06 — End: ?
  Filled 2023-08-06: qty 100

## 2023-08-06 MED ORDER — CEFAZOLIN SODIUM-DEXTROSE 1-4 GM/50ML-% IV SOLN
INTRAVENOUS | Status: AC
  Filled 2023-08-06: qty 50

## 2023-08-06 MED ORDER — PHENYLEPHRINE 80 MCG/ML (10ML) SYRINGE FOR IV PUSH (FOR BLOOD PRESSURE SUPPORT)
PREFILLED_SYRINGE | INTRAVENOUS | Status: DC | PRN
  Administered 2023-08-06: 160 ug via INTRAVENOUS

## 2023-08-06 MED ORDER — ACETAMINOPHEN 500 MG PO TABS: 1000.0000 mg | ORAL_TABLET | Freq: Once | ORAL | Status: DC

## 2023-08-06 MED ORDER — SODIUM CHLORIDE 0.9 % IV SOLN: INTRAVENOUS | Status: DC | PRN

## 2023-08-06 MED ORDER — MORPHINE SULFATE (PF) 2 MG/ML IV SOLN
1.0000 mg | Freq: Once | INTRAVENOUS | Status: AC
  Administered 2023-08-06: 1 mg via INTRAVENOUS
  Filled 2023-08-06: qty 1

## 2023-08-06 MED ORDER — CEFAZOLIN SODIUM-DEXTROSE 2-4 GM/100ML-% IV SOLN
INTRAVENOUS | Status: AC
  Filled 2023-08-06: qty 100

## 2023-08-06 MED ORDER — TRANEXAMIC ACID-NACL 1000-0.7 MG/100ML-% IV SOLN
1000.0000 mg | INTRAVENOUS | Status: AC
  Administered 2023-08-06: 1000 mg via INTRAVENOUS
  Filled 2023-08-06: qty 100

## 2023-08-06 MED ORDER — KETOROLAC TROMETHAMINE 30 MG/ML IJ SOLN
15.0000 mg | Freq: Once | INTRAMUSCULAR | Status: AC
  Administered 2023-08-06: 15 mg via INTRAVENOUS
  Filled 2023-08-06: qty 1

## 2023-08-06 MED ORDER — LIDOCAINE HCL (CARDIAC) PF 100 MG/5ML IV SOSY
PREFILLED_SYRINGE | INTRAVENOUS | Status: DC | PRN
  Administered 2023-08-06: 50 mg via INTRAVENOUS

## 2023-08-06 MED ORDER — MIDAZOLAM HCL 2 MG/2ML IJ SOLN
INTRAMUSCULAR | Status: AC
  Filled 2023-08-06: qty 2

## 2023-08-06 MED ORDER — DEXTROSE IN LACTATED RINGERS 5 % IV SOLN: INTRAVENOUS | Status: AC

## 2023-08-06 MED ORDER — FENTANYL CITRATE (PF) 100 MCG/2ML IJ SOLN
INTRAMUSCULAR | Status: AC
  Filled 2023-08-06: qty 2

## 2023-08-06 MED ORDER — TRANEXAMIC ACID-NACL 1000-0.7 MG/100ML-% IV SOLN
INTRAVENOUS | Status: AC
  Filled 2023-08-06: qty 100

## 2023-08-06 MED ORDER — GABAPENTIN 300 MG PO CAPS: 300.0000 mg | ORAL_CAPSULE | Freq: Once | ORAL | Status: DC

## 2023-08-06 MED ORDER — HYDROMORPHONE HCL 1 MG/ML IJ SOLN
INTRAMUSCULAR | Status: AC
  Filled 2023-08-06: qty 1

## 2023-08-06 MED ORDER — 0.9 % SODIUM CHLORIDE (POUR BTL) OPTIME
TOPICAL | Status: DC | PRN
  Administered 2023-08-06: 500 mL

## 2023-08-06 MED ORDER — CEFAZOLIN SODIUM-DEXTROSE 2-4 GM/100ML-% IV SOLN
2.0000 g | Freq: Three times a day (TID) | INTRAVENOUS | Status: AC
  Administered 2023-08-06: 2 g via INTRAVENOUS
  Filled 2023-08-06 (×2): qty 100

## 2023-08-06 SURGICAL SUPPLY — 35 items
BAG COUNTER SPONGE SURGICOUNT (BAG) IMPLANT
BASIN KIT SINGLE STR (MISCELLANEOUS) ×1 IMPLANT
BIT DRILL 4.3MMS DISTAL GRDTED (BIT) IMPLANT
CHLORAPREP W/TINT 26 (MISCELLANEOUS) ×1 IMPLANT
COVER LIGHT HANDLE STERIS (MISCELLANEOUS) ×1 IMPLANT
DRAPE C-ARMOR (DRAPES) ×1 IMPLANT
DRAPE STERI IOBAN 125X83 (DRAPES) ×1 IMPLANT
DRAPE U-SHAPE 47X51 STRL (DRAPES) ×2 IMPLANT
DRESSING MEPILEX FLEX 4X4 (GAUZE/BANDAGES/DRESSINGS) ×2 IMPLANT
DRILL 4.3MMS DISTAL GRADUATED (BIT) ×1
DRSG MEPILEX FLEX 3X3 (GAUZE/BANDAGES/DRESSINGS) IMPLANT
DRSG MEPILEX FLEX 4X4 (GAUZE/BANDAGES/DRESSINGS)
DRSG TEGADERM 4X4.75 (GAUZE/BANDAGES/DRESSINGS) IMPLANT
ELECT REM PT RETURN 9FT ADLT (ELECTROSURGICAL)
ELECTRODE REM PT RTRN 9FT ADLT (ELECTROSURGICAL) ×1 IMPLANT
GAUZE SPONGE 4X4 12PLY STRL (GAUZE/BANDAGES/DRESSINGS) ×1 IMPLANT
GAUZE XEROFORM 5X9 LF (GAUZE/BANDAGES/DRESSINGS) ×1 IMPLANT
GLOVE BIO SURGEON STRL SZ 6 (GLOVE) ×1 IMPLANT
GLOVE BIO SURGEON STRL SZ7.5 (GLOVE) ×1 IMPLANT
GLOVE BIOGEL PI IND STRL 6.5 (GLOVE) ×1 IMPLANT
GLOVE BIOGEL PI IND STRL 8 (GLOVE) ×1 IMPLANT
GOWN SRG XL LVL 3 NONREINFORCE (GOWNS) ×1 IMPLANT
GOWN STRL REUS W/ TWL LRG LVL3 (GOWN DISPOSABLE) ×2 IMPLANT
GUIDEPIN VERSANAIL DSP 3.2X444 (ORTHOPEDIC DISPOSABLE SUPPLIES) IMPLANT
GUIDEWIRE BALL NOSE 100CM (WIRE) IMPLANT
KIT TURNOVER KIT A (KITS) IMPLANT
NAIL IM AF HIP 11X400 125D LT (Nail) IMPLANT
NS IRRIG 1000ML POUR BTL (IV SOLUTION) ×1 IMPLANT
PACK HIP COMPR (MISCELLANEOUS) ×1 IMPLANT
SCREW BONE CORTICAL 5.0X50 (Screw) IMPLANT
SCREW LAG HIP NAIL 10.5X95 (Screw) IMPLANT
STAPLER SKIN PROX 35W (STAPLE) ×1 IMPLANT
SUT VIC AB 0 CT1 36 (SUTURE) ×1 IMPLANT
SUT VIC AB 1 CT1 36 (SUTURE) ×1 IMPLANT
SUT VIC AB 2-0 CT1 TAPERPNT 27 (SUTURE) ×1 IMPLANT

## 2023-08-06 NOTE — Plan of Care (Signed)
  Patient consented by her husband on phone with 2 witnessing RN s completed consent for surgical procedure on left hip. NPO prior to procedure. Given IV ketoralac for pain. Patient had general anesthesia and is resting post op with spouse at bedside. Responds to voice. Aspiration & fall  precautions maintained.   Goal: Will remain free from infection Outcome: Progressing Goal: Respiratory complications will improve Outcome: Progressing   Problem: Elimination: Goal: Will not experience complications related to urinary retention 08/06/2023 2023 by Starla Link, RN Outcome: Progressing 08/06/2023 1926 by Starla Link, RN Outcome: Progressing   Problem: Pain Management: Goal: General experience of comfort will improve Outcome: Progressing   Problem: Safety: Goal: Ability to remain free from injury will improve Outcome: Progressing   Problem: Skin Integrity: Goal: Risk for impaired skin integrity will decrease Outcome: Progressing

## 2023-08-06 NOTE — Op Note (Signed)
Date of Surgery: 08/06/2023  INDICATIONS: Ms. Huitt is a 67 y.o.-year-old female with left displaced intertrochanteric hip fracture.  The risk and benefits of the procedure were discussed in detail and documented in the pre-operative evaluation.   PREOPERATIVE DIAGNOSIS: 1.  Displaced left intertrochanteric hip fracture  POSTOPERATIVE DIAGNOSIS: Same.  PROCEDURE: 1.  Left hip cephalomedullary nail  SURGEON: Benancio Deeds MD  ASSISTANT: Kerby Less, ATC  ANESTHESIA:  general  IV FLUIDS AND URINE: See anesthesia record.  ANTIBIOTICS: Ancef  ESTIMATED BLOOD LOSS: 50 mL.  IMPLANTS:  Implant Name Type Inv. Item Serial No. Manufacturer Lot No. LRB No. Used Action  NAIL IM AF HIP [id number] 125D LT - [id number] Nail NAIL IM AF HIP 11X400 125D LT  [employer](ORTH,TRAU,BIO,SG) [id number] Left 1 Implanted  SCREW LAG HIP NAIL 10.5X95 - [id number] Screw SCREW LAG HIP NAIL 10.5X95  [employer](ORTH,TRAU,BIO,SG) [id number] Left 1 Implanted    DRAINS: None  CULTURES: None  COMPLICATIONS: none  DESCRIPTION OF PROCEDURE:   The patient's chart/medical history was reviewed and decision was made to administer peri-operative trans-exemic acid.  I have reviewed the patient's history and given the presence of a fragility fracture, I have deemed the necessity of a osteoporosis management referral or confirmed that the patient is currently enrolled in a osteoporosis treatment program.   The patient was identified in the preoperative holding area and the correct site was marked according universal protocol with nursing, and was subsequently taken back to the operating room.  Anesthesia was induced.  Antibiotics were given 1 hour prior to skin incision.  The patient was placed on the Hana table.  The traction post was placed.  Gross traction and internal rotation was able to provide the best reduction.  The patient was subsequently prepped and draped in the usual sterile fashion.   Final timeout  was performed.  A posterolateral approach to the greater trochanter was used.  This was done 3 cm proximal to the greater trochanter.  10 blade was used to incise through skin and IT band.  Blunt dissection was performed down to the level of the greater trochanter.  The pin was placed under direct fluoroscopic visualization at the center of the greater trochanter at the tip.  This was malleted into place down to the level of the lesser trochanter.  Opening reamer was then used.  A ball-tipped guidewire was then place to distal metaphyseal bone in the femur.  This was passed across the fracture site.  The ball wire was then measured and the appropriate size nail size long nail in 11mm was taken.  Size 12.5 reamer was used over the guidewire.  Nail was introduced.  The cephalomedullary wire was placed.  Again measurement was taken after confirming center center on the AP lateral fluoroscopy.  The appropriate size screw was then placed into the neck component of the femur.  The screw was tightened and then backed off one quarter term to allow for compression.  Distal interlocks were then placed.  This was done during perfect circle technique.  15 blade was used to incise through skin and IT band.  Drill was then used bicortically.  Screw sizes were measured and then 2 screws were placed both in static fashion.  The jig was removed.  The wounds were thoroughly irrigated.  Final fluoroscopy was confirmed good reduction on AP and laterals.  Wounds were closed in layers of 0 Vicryl 2-0 Vicryl and staples.  An Aquacel dressing was placed. All counts were  correct at the end of the case. The patient was awoken and taken to the PACU without complication.      POSTOPERATIVE PLAN: She will be weightbearing as tolerated on the left hip.  She will be seen by physical therapy for immediate weightbearing and ambulation postop.  Benancio Deeds, MD 1:26 PM

## 2023-08-06 NOTE — Progress Notes (Signed)
Pt bed alarm activated in room 129a, This RN attending Pt in different room. Upon immediate response to room 129a, Pt found in semi sitting position, leaning to L side, at bedside. Pt c/o pain to LLE. Small hematoma present On left frontal region of head. Pt has no further c/o pain or discomfort. Pt head to toe examination reveals no further sign of injury. Consulting civil engineer plus NT called to room immediately upon arrival. Consulting civil engineer pages provider; Jon Billings NP, who arrives within minutes, assessing Pt upon arrival. Pt assisted to bed gently via 4. Pt in bed in position of comfort, without change in condition. Fall protocol activated, Pt abrasion covered with bandage, hematoma treated with icepack, Xray and CT ordered by provider. Pt transported to Radiology via 2 in bed in position of comfort. XR/CT obtained without incident or change in condition. Pt returned to room. Tele sitter added upon arrival. Pt reassessed without change in condition. Will continue to monitor and treat per provider.

## 2023-08-06 NOTE — Care Management Important Message (Signed)
Important Message  Patient Details  Name: Rachel Huber MRN: [id number] Date of Birth: Sep 01, 1955   Important Message Given:  Yes - Medicare IM     Bernadette Hoit 08/06/2023, 9:44 AM

## 2023-08-06 NOTE — H&P (Signed)
ORTHOPAEDIC CONSULTATION  REQUESTING PHYSICIAN: Delfino Lovett, MD  Chief Complaint: Left intertrochanteric hip fracture  HPI: Rachel Huber is a 67 y.o. female with a left intertrochanteric hip fracture after a fall this morning when she attempted to get out of bed.  The nurse found her in a semisitting position.  X-ray was obtained which did show left displaced intertrochanteric hip fracture.  Additional imaging of the head was negative.  She has not been able to walk on the left side since this time.  She is currently admitted to the hospital with hepatic encephalopathy which has been slowly improving with lactulose.  Past Medical History:  Diagnosis Date   Anemia in chronic kidney disease (CKD) 02/07/2022   Anxiety    Arthritis    Ascites of liver    Weekly paracentesis   Cirrhosis of liver (HCC)    CKD (chronic kidney disease) stage 4, GFR 15-29 ml/min (HCC)    GERD (gastroesophageal reflux disease)    Grade IV internal hemorrhoids    Hypertension    NASH (nonalcoholic steatohepatitis)    Neuropathy, diabetic (HCC)    Obesity (BMI 30-39.9)    OSA on CPAP    Pneumonia    Portal hypertension (HCC)    S/P TIPS (transjugular intrahepatic portosystemic shunt)    Thalassemia minor 1992   Past Surgical History:  Procedure Laterality Date   ABDOMINAL HYSTERECTOMY     BREAST BIOPSY Right 02/15/2018   [place] bx 6-6:30 ribbon shape, ONE CORE FRAGMENT WITH FIBROSIS. ONE CORE FRAGMENT WITH PORTION OF A DILATED   BREAST BIOPSY Right 02/15/2018   [place] bx 9:00 heart shape, USUAL DUCTAL HYPERPLASIA   BREAST LUMPECTOMY Right 03/09/2018   Procedure: BREAST LUMPECTOMY x 2;  Surgeon: Sung Amabile, DO;  Location: [facility] ORS;  Service: General;  Laterality: Right;   CATARACT EXTRACTION W/PHACO Left 06/11/2016   Procedure: CATARACT EXTRACTION PHACO AND INTRAOCULAR LENS PLACEMENT (IOC);  Surgeon: Sallee Lange, MD;  Location: [facility] OR[facility];  Service: Ophthalmology;  Laterality: Left;  Lot #  [id number] H US:01:38.6 AP%:26.4 CDE:44.15   CATARACT EXTRACTION W/PHACO Right 06/15/2018   Procedure: CATARACT EXTRACTION PHACO AND INTRAOCULAR LENS PLACEMENT (IOC);  Surgeon: Galen Manila, MD;  Location: [facility] OR[facility];  Service: Ophthalmology;  Laterality: Right;  [place] 00:38.2 CDE 4.23 Fluid Pack Lot # [id number]   COLONOSCOPIES     COLONOSCOPY WITH PROPOFOL N/A 10/10/2020   Procedure: COLONOSCOPY WITH PROPOFOL;  Surgeon: Regis Bill, MD;  [facility] END[facility]COPY;  Service: Endoscopy;  Laterality: N/A;   COLONOSCOPY WITH PROPOFOL N/A 11/20/2020   Procedure: COLONOSCOPY WITH PROPOFOL;  Surgeon: Regis Bill, MD;  Location: [facility] ENDOSCOPY;  Service: Endoscopy;  Laterality: N/A;  DM STAT CBC, BMP COVID POSITIVE 09/02/2020   COLONOSCOPY WITH PROPOFOL N/A 06/19/2022   Procedure: COLONOSCOPY WITH PROPOFOL;  Surgeon: Regis Bill, MD;  Location: [facility] ENDOSCOPY;  Service: Endoscopy;  Laterality: N/A;   CTR     ESOPHAGOGASTRODUODENOSCOPY (EGD) WITH PROPOFOL N/A 10/10/2020   Procedure: ESOPHAGOGASTRODUODENOSCOPY (EGD) WITH PROPOFOL;  Surgeon: Regis Bill, MD;  Location: [facility] ENDOSCOPY;  Service: Endoscopy;  Laterality: N/A;  COVID POSITIVE 10/08/2020   FLEXIBLE SIGMOIDOSCOPY N/A 02/06/2022   Procedure: FLEXIBLE SIGMOIDOSCOPY;  Surgeon: Regis Bill, MD;  Location: [facility] ENDOSCOPY;  Service: Endoscopy;  Laterality: N/A;  Patient requests anesthesia   FLEXIBLE SIGMOIDOSCOPY N/A 12/19/2022   Procedure: FLEXIBLE SIGMOIDOSCOPY;  Surgeon: Dolores Frame, MD;  Location: [facility];  Service: Gastroenterology;  Laterality: N/A;   IR ANGIOGRAM SELECTIVE  EACH ADDITIONAL VESSEL  02/07/2022   IR ANGIOGRAM SELECTIVE EACH ADDITIONAL VESSEL  02/07/2022   IR ANGIOGRAM SELECTIVE EACH ADDITIONAL VESSEL  09/05/2022   IR ANGIOGRAM VISCERAL SELECTIVE  02/07/2022   IR ANGIOGRAM VISCERAL SELECTIVE  09/05/2022   IR CV LINE INJECTION  02/24/2023   IR EMBO ART  VEN HEMORR LYMPH EXTRAV  INC  GUIDE ROADMAPPING  09/05/2022   IR EMBO ART  VEN HEMORR LYMPH EXTRAV  INC GUIDE ROADMAPPING  11/05/2022   IR EMBO ARTERIAL NOT HEMORR HEMANG INC GUIDE ROADMAPPING  02/07/2022   IR EMBO TUMOR ORGAN ISCHEMIA INFARCT INC GUIDE ROADMAPPING  01/06/2023   IR IMAGING GUIDED PORT INSERTION  06/13/2022   IR IMAGING GUIDED PORT INSERTION  11/25/2022   IR PARACENTESIS  12/17/2021   IR PARACENTESIS  03/25/2022   IR PARACENTESIS  11/25/2022   IR PARACENTESIS  01/06/2023   IR PARACENTESIS  01/19/2023   IR RADIOLOGIST EVAL & MGMT  03/18/2022   IR RADIOLOGIST EVAL & MGMT  05/07/2022   IR RADIOLOGIST EVAL & MGMT  05/16/2022   IR RADIOLOGIST EVAL & MGMT  10/13/2022   IR RADIOLOGIST EVAL & MGMT  12/12/2022   IR RADIOLOGIST EVAL & MGMT  03/30/2023   IR US GUIDE VASC ACCESS RIGHT  02/07/2022   IR US GUIDE VASC ACCESS RIGHT  09/05/2022   JOINT REPLACEMENT     KNEE SURGERY Right 09/24/2017   plates and pins   PORT-A-CATH REMOVAL N/A 09/23/2022   Procedure: MINOR REMOVAL PORT-A-CATH;  Surgeon: Franky Macho, MD;  Location: [facility];  Service: General;  Laterality: N/A;   TEE WITHOUT CARDIOVERSION N/A 09/23/2022   Procedure: TRANSESOPHAGEAL ECHOCARDIOGRAM (TEE);  Surgeon: Antoine Poche, MD;  Location: [facility];  Service: Endoscopy;  Laterality: N/A;   TOTAL SHOULDER ARTHROPLASTY Right 09/27/2015   Social History   Socioeconomic History   Marital status: Married    Spouse name: Not on file   Number of children: 0   Years of education: Not on file   Highest education level: Not on file  Occupational History   Occupation: EMPLOYED  Tobacco Use   Smoking status: Former    Types: Cigarettes   Smokeless tobacco: Never   Tobacco comments:    Smoked "some" in college  Vaping Use   Vaping status: Never Used  Substance and Sexual Activity   Alcohol use: No    Comment: minimal alcohol intake in college   Drug use: Never   Sexual activity: Not Currently  Other Topics Concern   Not on file  Social History Narrative   Not on  file   Social Drivers of Health   Financial Resource Strain: Low Risk  (06/03/2023)   Received from [facility]   Overall Financial Resource Strain (CARDIA)    Difficulty of Paying Living Expenses: Not very hard  Food Insecurity: Patient Unable To Answer (07/29/2023)   Hunger Vital Sign    Worried About Running Out of Food in the Last Year: Patient unable to answer    Ran Out of Food in the Last Year: Patient unable to answer  Transportation Needs: Patient Unable To Answer (07/29/2023)   PRAPARE - Transportation    Lack of Transportation (Medical): Patient unable to answer    Lack of Transportation (Non-Medical): Patient unable to answer  Physical Activity: Inactive (04/04/2022)   Exercise Vital Sign    Days of Exercise per Week: 0 days    Minutes of Exercise per Session: 0 min  Stress:  No Stress Concern Present (04/04/2022)   [employer] of Occupational Health - Occupational Stress Questionnaire    Feeling of Stress : Only a little  Social Connections: Moderately Isolated (04/04/2022)   Social Connection and Isolation Panel [NHANES]    Frequency of Communication with Friends and Family: More than three times a week    Frequency of Social Gatherings with Friends and Family: Three times a week    Attends Religious Services: Never    Active Member of Clubs or Organizations: No    Attends Banker Meetings: Never    Marital Status: Married   Family History  Problem Relation Age of Onset   Breast cancer Mother 2   Lymphoma Mother    Diabetes Father    Kidney cancer Father    Heart disease Father    Diabetes Sister    Breast cancer Sister 32   - negative except otherwise stated in the family history section Allergies  Allergen Reactions   Latex Rash    Contact rash   Tape Other (See Comments)    Skin sensitivity   Gramineae Pollens Other (See Comments)    Sneezing, Running nose  Other Reaction(s): Other (see comments)   Prior to  Admission medications   Medication Sig Start Date End Date Taking? Authorizing Provider  butalbital-acetaminophen-caffeine (FIORICET) 50-325-40 MG tablet Take 1 tablet by mouth every 8 (eight) hours as needed for headache. 01/26/23  Yes [provider]  colchicine 0.6 MG tablet Take 1 tablet (0.6 mg total) by mouth 2 (two) times daily. 05/22/23  Yes Sharee Holster, NP  cyanocobalamin (VITAMIN B12) 500 MCG tablet Take 1,000 mcg by mouth daily. 06/28/21  Yes [provider]  ferrous sulfate 325 (65 FE) MG tablet Take 325 mg by mouth daily.   Yes [provider]  folic acid (FOLVITE) 1 MG tablet Take 1 tablet (1 mg total) by mouth daily. 05/25/23  Yes Doreatha Massed, MD  potassium chloride SA (KLOR-CON M) 20 MEQ tablet Take 20 mEq by mouth daily.   Yes [provider]  sertraline (ZOLOFT) 50 MG tablet Take 1 tablet (50 mg total) by mouth daily. 05/22/23  Yes Sharee Holster, NP  torsemide (DEMADEX) 20 MG tablet Take 2 tablets (40 mg total) by mouth daily. 05/22/23  Yes Sharee Holster, NP  Vitamin D, Ergocalciferol, (DRISDOL) 1.25 MG (50000 UNIT) CAPS capsule Take 1 capsule (50,000 Units total) by mouth once a week. 05/22/23  Yes Sharee Holster, NP  XIFAXAN 550 MG TABS tablet Take 1 tablet (550 mg total) by mouth 2 (two) times daily. 05/22/23  Yes Sharee Holster, NP   DG HIP UNILAT WITH PELVIS 1V LEFT Result Date: 08/06/2023 CLINICAL DATA:  Fall with painful left hip EXAM: DG HIP (WITH OR WITHOUT PELVIS) 3 V COMPARISON:  None similar FINDINGS: Acute intertrochanteric left femur fracture with varus angulation. Both hips remain located. No evidence of pelvic ring fracture. Embolization coils overlap the bilateral and central pelvis. IMPRESSION: Acute intertrochanteric left femur fracture with varus angulation. Electronically Signed   By: Tiburcio Pea M.D.   On: 08/06/2023 05:39   CT HEAD WO CONTRAST ( ) Result Date: 08/06/2023 CLINICAL DATA:  Head trauma,  minor. Altered mental status. Hepatic encephalopathy. EXAM: CT HEAD WITHOUT CONTRAST TECHNIQUE: Contiguous axial images were obtained from the base of the skull through the vertex without intravenous contrast. RADIATION DOSE REDUCTION: This exam was performed according to the departmental dose-optimization program which includes automated exposure control,  adjustment of the mA and/or kV according to patient size and/or use of iterative reconstruction technique. COMPARISON:  Brain MRI from 2 days ago FINDINGS: Brain: No evidence of acute infarction, hemorrhage, hydrocephalus, extra-axial collection or extra-axial collection. 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 No edema seen in the adjacent displaced temporal lobe. Or mass lesion/mass effect. Vascular: No hyperdense vessel or unexpected calcification. Skull: Normal. Negative for fracture or focal lesion. 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 IMPRESSION: No acute finding.  No evidence of intracranial injury. Electronically Signed   By: Tiburcio Pea M.D.   On: 08/06/2023 04:57   MR BRAIN WO CONTRAST Result Date: 08/04/2023 CLINICAL DATA:  Altered mental status EXAM: MRI HEAD WITHOUT CONTRAST TECHNIQUE: Multiplanar, multiecho pulse sequences of the brain and surrounding structures were obtained without intravenous contrast. COMPARISON:  05/02/2023 FINDINGS: Brain: No acute infarct, mass effect or extra-axial collection. Hemosiderin deposition within the basal cisterns and lateral ventricles. No acute hemorrhage. Minimal multifocal hyperintense T2-weight signal within the white matter. Mild volume loss. Unchanged appearance of 2.2 cm densely calcified meningioma in the right middle cranial fossa. The midline structures are normal. Vascular: Normal flow voids. Skull and upper cervical spine: Normal  calvarium and skull base. Visualized upper cervical spine and soft tissues are normal. Sinuses/Orbits:Retention cyst in the sphenoid sinus. Trace right mastoid fluid. Ocular lens replacements IMPRESSION: 1. No acute intracranial abnormality. 2. Unchanged appearance of 2.2 cm densely calcified meningioma in the right middle cranial fossa. Electronically Signed   By: Deatra Robinson M.D.   On: 08/04/2023 21:01     Positive ROS: All other systems have been reviewed and were otherwise negative with the exception of those mentioned in the HPI and as above.  Physical Exam: General: No acute distress Cardiovascular: No pedal edema Respiratory: No cyanosis, no use of accessory musculature GI: No organomegaly, abdomen is soft and non-tender Skin: No lesions in the area of chief complaint Neurologic: Sensation intact distally Psychiatric: Patient is at baseline mood and affect Lymphatic: No axillary or cervical lymphadenopathy  MUSCULOSKELETAL:  Left hip shortened externally rotated.  She is not able to comply with physical examination due to mental status.  She does spontaneously plantarflex left foot.  2+ dorsalis pedis pulse.  Independent Imaging Review: 3 views left hip: Displaced left intertrochanteric hip fracture  Assessment: 67 year old female with a displaced left intertrochanteric hip fracture after a fall earlier this morning while getting out of bed.  At this time given the displacement I would recommend fixation with a cephalomedullary nail in order to allow for early mobilization.  I did discuss the risks and alternatives to surgery.  Currently she has been stabilized from a hepatic encephalopathy standpoint.  I did discuss the risks and limitations associated with this procedure.  I did discuss the alternatives including a period of nonweightbearing and the complications that can come with being bedbound including pneumonia or bedsores.  After discussion with predominantly her husband we  will plan for left hip cephalomedullary nailing  Plan: Plan for left hip cephalomedullary nailing   After a lengthy discussion of treatment options, including risks, benefits, alternatives, complications of surgical and nonsurgical conservative options, the patient elected surgical repair.   The patient  is aware of the material risks  and complications including, but not limited to injury to adjacent structures, neurovascular injury, infection, numbness, bleeding, implant failure, thermal burns,  stiffness, persistent pain, failure to heal, disease transmission from allograft, need for further surgery, dislocation, anesthetic risks, blood clots, risks of death,and others. The probabilities of surgical success and failure discussed with patient given their particular co-morbidities.The time and nature of expected rehabilitation and recovery was discussed.The patient's questions were all answered preoperatively.  No barriers to understanding were noted. I explained the natural history of the disease process and Rx rationale.  I explained to the patient what I considered to be reasonable expectations given their personal situation.  The final treatment plan was arrived at through a shared patient decision making process model.   Thank you for the consult and the opportunity to see Ms. Nicholes Stairs, MD [facility] 8:44 AM

## 2023-08-06 NOTE — Progress Notes (Signed)
PROGRESS NOTE    Rachel Huber  BJY:[id number] DOB: [birth year] DOA: 07/28/2023 PCP: Lauro Regulus, MD    Brief Narrative:  67 year old female with history of non-insulin-dependent diabetes mellitus, CKD stage IIIb, Nash liver cirrhosis with refractory ascites status post TIPS creation status post 2 revisions, thalassemia minor, history of rectal bleeding status post superior rectal artery embolization x 2 (June 2023 and March 2024), history of recurrent grade 4 internal hemorrhoids, and 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   12/12: Patient has been excepting her lactulose and having multiple bowel movements.  Mental status remains poor  12/13: No clinical status changes.  Patient is a little bit more awake.  Has been taking her p.o. lactulose. 12/15: Mental status seems close to baseline.  Waiting for PT and OT eval 12/16: PT and OT recommends SNF.  Husband refusing to take her back to [facility].  TOC working on alternative placement 12/17: Patient refusing lactulose at times.  Ammonia trending up.  MRI of the brain shows no acute pathology 12/18: Lactulose enema was given as an option yesterday for oral lactulose when she is not able to take.  Ammonia 94 12/19: Patient reportedly fell overnight/early morning and has new left intertrochanteric hip fracture.  Going for surgery/OR today  Assessment & Plan:   Principal Problem:   Hepatic encephalopathy (HCC) Active Problems:   Chronic kidney disease, stage 3b (HCC)   HLD (hyperlipidemia)   Depression with anxiety   Type 2 diabetes mellitus with hyperlipidemia (HCC)   OSA on CPAP   Anemia in chronic kidney disease (CKD)   Hypokalemia   AMS (altered mental status)   Aortic atherosclerosis (HCC)   Beta thalassemia intermedia (HCC)   Obesity, Class III, BMI 40-49.9 (morbid obesity) (HCC)   Hypomagnesemia   Hepatic encephalopathy  (HCC) Decompensated NASH cirrhosis Suspect secondary to patient refusal to take lactulose at nursing facility Continue oral lactulose 30 g 3 times daily Titrate to 3-4 soft bowel movements daily.  If having more bowel movements or diarrhea occurs will need to scale back on lactulose dosing Continue home rifaximin Patient refuses lactulose at times..  Ammonia 109->124-> 94-> 77.  Lactulose dose timing adjusted by palliative care team to improve compliance.  Acute left intertrochanteric hip fracture Status post fall early this morning around 4 AM.  Per report she was trying to get out of the bed by herself to go to the bathroom.  Follow-up did activate and patient was immediately evaluated Underwent head CT which was negative for acute findings. Left hip x-ray showed acute fracture as above. Pain medicine as needed as ordered Orthopedic Dr. Steward Drone aware and taking her to surgery today Husband updated over phone, he is upset  Chronic kidney disease, stage 3b (HCC) Renal function slowly worsening, will monitor.  Will start IV hydration   Hypomagnesemia Monitor and replace as necessary   Obesity, Class III, BMI 40-49.9 (morbid obesity) (HCC) Complicating factor to overall care and prognosis   Acute metabolic encephalopathy  with history of frequent falls in setting of lactulose noncompliance.  Had fall overnight leading to new acute left hip fracture Fall precautions, aspiration precaution PT OT recommends SNF -she had insurance Auth and bed availability for 12/19 but now that will be postponed due to new fracture Her MRI of the brain in September 2024 showed - 2.2 x 1.5 cm heavily calcified meningioma in the right middle cranial fossa with some mass  effect on the lateral aspect of the right temporal tip.  I discussed with husband and he shared that she had a follow-up regarding that and this was thought to be benign and nothing needed to be done MRI brain is unchanged from before    Hypokalemia Monitor and replace as necessary   Anemia in chronic kidney disease (CKD) Currently no clinical concerns for bleeding at this time.  Hemoglobin 7.5, may require transfusion for hemoglobin less than 7 Patient's baseline range over the last month has been 7.1-9.1 No indication for transfusion at this time   OSA on CPAP CPAP nightly ordered on admission   DVT prophylaxis: Heparin subcu Code Status: Full Family Communication: Husband updated over phone Disposition Plan: Status is: Inpatient Remains inpatient appropriate because: Hepatic encephalopathy, decompensated hepatic cirrhosis, new left hip fracture   Level of care: Telemetry Medical   Subjective:  She is agitated and in some pain.  Tele sitter in place.  Objective: Vitals:   08/05/23 2300 08/06/23 0351 08/06/23 0749 08/06/23 1103  BP: (!) 124/59 (!) 140/58 (!) 105/49 127/60  Pulse: 82 91 86 92  Resp: 12 16 16 18   Temp: 98.7 F (37.1 C) 97.7 F (36.5 C) 98.7 F (37.1 C) 97.7 F (36.5 C)  TempSrc: Oral Oral  Tympanic  SpO2: 97% 95% 99% 97%  Weight:      Height:        Intake/Output Summary (Last 24 hours) at 08/06/2023 1132 Last data filed at 08/05/2023 1900 Gross per 24 hour  Intake 0 ml  Output --  Net 0 ml     Filed Weights   07/28/23 1710  Weight: 135.2 kg    Examination: Overall difficult exam due to her confusion and agitation.  She does not cooperate General exam: Appears agitated and in pain, left frontal contusion with hematoma with small scrape present. Respiratory system: Decreased breath sounds at the bases normal work of breathing.  Cardiovascular system: S1-S2, RRR, no murmurs, no pedal edema Gastrointestinal system: Soft, benign Central nervous system: She is awake and sitting in the chair Extremities: Left hip with tenderness with any movement, left hip shortened and externally rotated Psychiatry: She is agitated    Data Reviewed: I have personally reviewed following  labs and imaging studies  CBC: Recent Labs  Lab 08/01/23 0807 08/03/23 0230 08/04/23 0430 08/06/23 0835  WBC 6.2 5.2 4.5 8.0  NEUTROABS 3.5  --   --   --   HGB 7.8* 7.9* 7.9* 7.5*  HCT 25.9* 25.8* 25.9* 24.7*  MCV 74.9* 72.5* 74.0* 73.3*  PLT 156 167 175 164   Basic Metabolic Panel: Recent Labs  Lab 08/01/23 0807 08/03/23 0230 08/04/23 0430 08/06/23 0835  NA 144 139 142 142  K 3.5 3.6 3.8 3.5  CL 109 105 106 106  CO2 27 30 28  21*  GLUCOSE 117* 102* 115* 190*  BUN 37* 30* 30* 32*  CREATININE 1.14* 0.93 1.06* 1.23*  CALCIUM 9.4 9.8 9.9 10.6*   GFR: Estimated Creatinine Clearance: 67.5 mL/min (A) (by C-G formula based on SCr of 1.23 mg/dL (H)). Liver Function Tests: Recent Labs  Lab 08/03/23 0230 08/06/23 0835  AST 38 50*  ALT 21 30  ALKPHOS 87 97  BILITOT 2.9* 3.5*  PROT 6.0* 6.2*  ALBUMIN 3.0* 2.9*   No results for input(s): "LIPASE", "AMYLASE" in the last 168 hours.  Recent Labs  Lab 08/03/23 0230 08/04/23 0430 08/05/23 0421 08/06/23 0504  AMMONIA 109* 124* 94* 77*  Scheduled Meds:  acetaminophen  1,000 mg Oral Once   Chlorhexidine Gluconate Cloth  6 each Topical Daily   cyanocobalamin  1,000 mcg Oral Daily   folic acid  1 mg Oral Daily   gabapentin  300 mg Oral Once   heparin  5,000 Units Subcutaneous Q8H   lactulose  30 g Oral TID   lactulose  300 mL Rectal BID   rifaximin  550 mg Oral BID   Continuous Infusions:   ceFAZolin (ANCEF) IV     tranexamic acid     She is critically sick and high risk for cardiorespiratory failure, multiorgan failure and death.   LOS: 9 days   Time spent 35 minutes Delfino Lovett, MD [facility]   If 7PM-7AM, please contact night-coverage  08/06/2023, 11:32 AM

## 2023-08-06 NOTE — TOC Progression Note (Signed)
Transition of Care [facility]) - Progression Note    Patient Details  Name: Rachel Huber MRN: [id number] Date of Birth: [birth year]  Transition of Care [facility]) CM/SW Contact  Garret Reddish, RN Phone Number: 08/06/2023, 1:36 PM  Clinical Narrative:     Chart reviewed.  Discharge has been cancelled for today.  Patient to have a Left Hip Cephalomedullary Nail Surgery today.  I have informed [place] with [facility] that discharge has been cancelled for today.  I have cancelled [facility] transport for today.    TOC will continue to follow for discharge planning.   Expected Discharge Plan: Skilled Nursing Facility Barriers to Discharge: Continued Medical Work up  Expected Discharge Plan and Services                                               Social Determinants of Health (SDOH) Interventions SDOH Screenings   Food Insecurity: Patient Unable To Answer (07/29/2023)  Housing: Patient Unable To Answer (07/29/2023)  Transportation Needs: Patient Unable To Answer (07/29/2023)  Utilities: Patient Unable To Answer (07/29/2023)  Alcohol Screen: Low Risk  (04/04/2022)  Depression (PHQ2-9): Low Risk  (04/27/2023)  Financial Resource Strain: Low Risk  (06/03/2023)   Received from [facility]  Physical Activity: Inactive (04/04/2022)  Social Connections: Moderately Isolated (04/04/2022)  Stress: No Stress Concern Present (04/04/2022)  Tobacco Use: Medium Risk (07/28/2023)    Readmission Risk Interventions    04/30/2023   12:35 PM 12/19/2022   10:27 AM 11/04/2022   11:32 AM  Readmission Risk Prevention Plan  Transportation Screening Complete Complete Complete  Medication Review Oceanographer) Complete Complete Complete  PCP or Specialist appointment within 3-5 days of discharge Not Complete Complete Complete  HRI or Home Care Consult Complete Complete   SW Recovery Care/Counseling Consult Complete Complete Complete  Palliative Care Screening Not Applicable  Not Applicable Not Applicable  Skilled Nursing Facility Not Complete Not Applicable Not Applicable

## 2023-08-06 NOTE — Anesthesia Preprocedure Evaluation (Addendum)
Anesthesia Evaluation  Patient identified by MRN, date of birth, ID band Patient confused  General Assessment Comment:Patient presented with AMS and decompensated cirrhosis   Reviewed: Allergy & Precautions, H&P , Patient's Chart, lab work & pertinent test results, Unable to perform ROS - Chart review only  History of Anesthesia Complications Negative for: history of anesthetic complications  Airway Mallampati: III  TM Distance: >3 FB Neck ROM: full    Dental no notable dental hx.    Pulmonary sleep apnea and Continuous Positive Airway Pressure Ventilation , former smoker   Pulmonary exam normal        Cardiovascular hypertension, On Medications Normal cardiovascular exam     Neuro/Psych  PSYCHIATRIC DISORDERS Anxiety Depression     Neuromuscular disease    GI/Hepatic ,GERD  ,,(+) Cirrhosis   ascites      Endo/Other  diabetes    Renal/GU Renal disease     Musculoskeletal   Abdominal   Peds  Hematology  (+) Blood dyscrasia, anemia   Anesthesia Other Findings Past Medical History: 02/07/2022: Anemia in chronic kidney disease (CKD) No date: Anxiety No date: Arthritis No date: Ascites of liver     Comment:  Weekly paracentesis No date: Cirrhosis of liver (HCC) No date: CKD (chronic kidney disease) stage 4, GFR 15-29 ml/min (HCC) No date: GERD (gastroesophageal reflux disease) No date: Grade IV internal hemorrhoids No date: Hypertension No date: NASH (nonalcoholic steatohepatitis) No date: Neuropathy, diabetic (HCC) No date: Obesity (BMI 30-39.9) No date: OSA on CPAP No date: Pneumonia No date: Portal hypertension (HCC) No date: S/P TIPS (transjugular intrahepatic portosystemic shunt) 1992: Thalassemia minor  Past Surgical History: No date: ABDOMINAL HYSTERECTOMY 02/15/2018: BREAST BIOPSY; Right     Comment:  [place] bx 6-6:30 ribbon shape, ONE CORE FRAGMENT WITH               FIBROSIS. ONE CORE FRAGMENT WITH  PORTION OF A DILATED 02/15/2018: BREAST BIOPSY; Right     Comment:  [place] bx 9:00 heart shape, USUAL DUCTAL HYPERPLASIA 03/09/2018: BREAST LUMPECTOMY; Right     Comment:  Procedure: BREAST LUMPECTOMY x 2;  Surgeon: Sung Amabile, DO;  Location: [facility];  Service: General;                Laterality: Right; 06/11/2016: CATARACT EXTRACTION W/PHACO; Left     Comment:  Procedure: CATARACT EXTRACTION PHACO AND INTRAOCULAR               LENS PLACEMENT (IOC);  Surgeon: Sallee Lange, MD;                Location: [facility];  Service: Ophthalmology;  Laterality:              Left;  Lot # [id number] H US:01:38.6 AP%:26.4 CDE:44.15 06/15/2018: CATARACT EXTRACTION W/PHACO; Right     Comment:  Procedure: CATARACT EXTRACTION PHACO AND INTRAOCULAR               LENS PLACEMENT (IOC);  Surgeon: Galen Manila, MD;                Location: [facility];  Service: Ophthalmology;  Laterality:              Right;  [place] 00:38.2 CDE 4.23 Fluid Pack Lot # [id number] No date: COLONOSCOPIES 10/10/2020: COLONOSCOPY WITH PROPOFOL; N/A     Comment:  Procedure: COLONOSCOPY WITH PROPOFOL;  Surgeon:  Regis Bill, MD;  Location: [facility];                Service: Endoscopy;  Laterality: N/A; 11/20/2020: COLONOSCOPY WITH PROPOFOL; N/A     Comment:  Procedure: COLONOSCOPY WITH PROPOFOL;  Surgeon:               Regis Bill, MD;  Location: [facility];                Service: Endoscopy;  Laterality: N/A;  DM STAT CBC,               BMP COVID POSITIVE 09/02/2020 06/19/2022: COLONOSCOPY WITH PROPOFOL; N/A     Comment:  Procedure: COLONOSCOPY WITH PROPOFOL;  Surgeon:               Regis Bill, MD;  Location: [facility];                Service: Endoscopy;  Laterality: N/A; No date: CTR 10/10/2020: ESOPHAGOGASTRODUODENOSCOPY (EGD) WITH PROPOFOL; N/A     Comment:  Procedure: ESOPHAGOGASTRODUODENOSCOPY (EGD) WITH               PROPOFOL;  Surgeon: Regis Bill, MD;   Location:               [facility];  Service: Endoscopy;  Laterality: N/A;                COVID POSITIVE 10/08/2020 02/06/2022: FLEXIBLE SIGMOIDOSCOPY; N/A     Comment:  Procedure: FLEXIBLE SIGMOIDOSCOPY;  Surgeon: Regis Bill, MD;  Location: [facility];  Service:               Endoscopy;  Laterality: N/A;  Patient requests anesthesia 12/19/2022: FLEXIBLE SIGMOIDOSCOPY; N/A     Comment:  Procedure: FLEXIBLE SIGMOIDOSCOPY;  Surgeon: Marguerita Merles, Reuel Boom, MD;  [facility]: [facility];  Service:               Gastroenterology;  Laterality: N/A; 02/07/2022: IR ANGIOGRAM SELECTIVE EACH ADDITIONAL VESSEL 02/07/2022: IR ANGIOGRAM SELECTIVE EACH ADDITIONAL VESSEL 09/05/2022: IR ANGIOGRAM SELECTIVE EACH ADDITIONAL VESSEL 02/07/2022: IR ANGIOGRAM VISCERAL SELECTIVE 09/05/2022: IR ANGIOGRAM VISCERAL SELECTIVE 02/24/2023: IR CV LINE INJECTION 09/05/2022: IR EMBO ART  VEN HEMORR LYMPH EXTRAV  INC GUIDE ROADMAPPING 11/05/2022: IR EMBO ART  VEN HEMORR LYMPH EXTRAV  INC GUIDE ROADMAPPING 02/07/2022: IR EMBO ARTERIAL NOT HEMORR HEMANG INC GUIDE ROADMAPPING 01/06/2023: IR EMBO TUMOR ORGAN ISCHEMIA INFARCT INC GUIDE ROADMAPPING 06/13/2022: IR IMAGING GUIDED PORT INSERTION 11/25/2022: IR IMAGING GUIDED PORT INSERTION 12/17/2021: IR PARACENTESIS 03/25/2022: IR PARACENTESIS 11/25/2022: IR PARACENTESIS 01/06/2023: IR PARACENTESIS 01/19/2023: IR PARACENTESIS 03/18/2022: IR RADIOLOGIST EVAL & MGMT 05/07/2022: IR RADIOLOGIST EVAL & MGMT 05/16/2022: IR RADIOLOGIST EVAL & MGMT 10/13/2022: IR RADIOLOGIST EVAL & MGMT 12/12/2022: IR RADIOLOGIST EVAL & MGMT 03/30/2023: IR RADIOLOGIST EVAL & MGMT 02/07/2022: IR US GUIDE VASC ACCESS RIGHT 09/05/2022: IR US GUIDE VASC ACCESS RIGHT No date: JOINT REPLACEMENT 09/24/2017: KNEE SURGERY; Right     Comment:  plates and pins 09/23/2022: PORT-A-CATH REMOVAL; N/A     Comment:  Procedure: MINOR REMOVAL PORT-A-CATH;  Surgeon: Franky Macho, MD;   Location: [facility];  Service: General;  Laterality: N/A; 09/23/2022: TEE WITHOUT CARDIOVERSION; N/A     Comment:  Procedure: TRANSESOPHAGEAL ECHOCARDIOGRAM (TEE);                Surgeon: Antoine Poche, MD;  Location: [facility];                Service: Endoscopy;  Laterality: N/A; 09/27/2015: TOTAL SHOULDER ARTHROPLASTY; Right  BMI    Body Mass Index: 44.01 kg/m      Reproductive/Obstetrics negative OB ROS                             Anesthesia Physical Anesthesia Plan  ASA: 4  Anesthesia Plan: General ETT   Post-op Pain Management: Toradol IV (intra-op)* and Dilaudid IV   Induction: Intravenous  PONV Risk Score and Plan: 3 and Ondansetron, Dexamethasone and Treatment may vary due to age or medical condition  Airway Management Planned: Oral ETT  Additional Equipment:   Intra-op Plan:   Post-operative Plan: Extubation in OR  Informed Consent: I have reviewed the patients History and Physical, chart, labs and discussed the procedure including the risks, benefits and alternatives for the proposed anesthesia with the patient or authorized representative who has indicated his/her understanding and acceptance.     Dental Advisory Given and Consent reviewed with POA  Plan Discussed with: Anesthesiologist, CRNA and Surgeon  Anesthesia Plan Comments: (Patient consented for risks of anesthesia including but not limited to:  - adverse reactions to medications - damage to eyes, teeth, lips or other oral mucosa - nerve damage due to positioning  - sore throat or hoarseness - Damage to heart, brain, nerves, lungs, other parts of body or loss of life  Patient voiced understanding and assent.)        Anesthesia Quick Evaluation

## 2023-08-06 NOTE — Plan of Care (Signed)
Patient encephalopathy, responsive to voice oriented to self, confused. Kept NPO for procedure. Oral care completed. Ketoralac given for discomfort after changing and sacral mepilex placed. Patient went to surgery this afternoon for repair of hip: INTRAMEDULLARY (IM) NAIL INTERTROCHANTERIC (Left). General anesthesia patient resting spouse in room and she has Dextrose 5% 84mL/hr continuous. Bed is locked in lowest position call bell in reach. Fall and aspiration precautions maintained.   Problem: Clinical Measurements: Goal: Will remain free from infection Outcome: Progressing Goal: Respiratory complications will improve Outcome: Progressing   Problem: Elimination: Goal: Will not experience complications related to urinary retention Outcome: Progressing

## 2023-08-06 NOTE — Anesthesia Procedure Notes (Signed)
Procedure Name: Intubation Date/Time: 08/06/2023 12:38 PM  Performed by: Elisabeth Pigeon, CRNAPre-anesthesia Checklist: Patient identified, Patient being monitored, Timeout performed, Emergency Drugs available and Suction available Patient Re-evaluated:Patient Re-evaluated prior to induction Oxygen Delivery Method: Circle system utilized Preoxygenation: Pre-oxygenation with 100% oxygen Induction Type: IV induction Ventilation: Mask ventilation without difficulty Laryngoscope Size: 3 and McGrath Grade View: Grade I Tube type: Oral Tube size: 7.0 mm Number of attempts: 1 Airway Equipment and Method: Stylet Placement Confirmation: ETT inserted through vocal cords under direct vision, positive ETCO2 and breath sounds checked- equal and bilateral Secured at: 22 cm Tube secured with: Tape Dental Injury: Teeth and Oropharynx as per pre-operative assessment

## 2023-08-06 NOTE — Interval H&P Note (Signed)
History and Physical Interval Note:  08/06/2023 11:28 AM  Rachel Huber  has presented today for surgery, with the diagnosis of hip fx.  The various methods of treatment have been discussed with the patient and family. After consideration of risks, benefits and other options for treatment, the patient has consented to  Procedure(s): INTRAMEDULLARY (IM) NAIL INTERTROCHANTERIC (Left) as a surgical intervention.  The patient's history has been reviewed, patient examined, no change in status, stable for surgery.  I have reviewed the patient's chart and labs.  Questions were answered to the patient's satisfaction.     Huel Cote

## 2023-08-06 NOTE — Brief Op Note (Signed)
   Brief Op Note  Date of Surgery: 08/06/2023  Preoperative Diagnosis: hip fx  Postoperative Diagnosis: same  Procedure: Procedure(s): INTRAMEDULLARY (IM) NAIL INTERTROCHANTERIC  Implants: Implant Name Type Inv. Item Serial No. Manufacturer Lot No. LRB No. Used Action  NAIL IM AF HIP [id number] 125D LT - [id number] Nail NAIL IM AF HIP 11X400 125D LT  [employer](ORTH,TRAU,BIO,SG) [id number] Left 1 Implanted  SCREW LAG HIP NAIL 10.5X95 - [id number] Screw SCREW LAG HIP NAIL 10.5X95  [employer](ORTH,TRAU,BIO,SG) [id number] Left 1 Implanted    Surgeons: Surgeon(s): Huel Cote, MD  Anesthesia: Spinal    Estimated Blood Loss: See anesthesia record  Complications: None  Condition to PACU: Stable  Benancio Deeds, MD 08/06/2023 1:26 PM

## 2023-08-06 NOTE — Transfer of Care (Signed)
Immediate Anesthesia Transfer of Care Note  Patient: Rachel Huber  Procedure(s) Performed: INTRAMEDULLARY (IM) NAIL INTERTROCHANTERIC (Left)  Patient Location: PACU  Anesthesia Type:General  Level of Consciousness: drowsy  Airway & Oxygen Therapy: Patient Spontanous Breathing and Patient connected to face mask oxygen  Post-op Assessment: Report given to RN and Post -op Vital signs reviewed and stable  Post vital signs: Reviewed and stable  Last Vitals:  Vitals Value Taken Time  BP 128/49 08/06/23 1330  Temp 37.1 C 08/06/23 1330  Pulse 94 08/06/23 1337  Resp 13 08/06/23 1337  SpO2 100 % 08/06/23 1337  Vitals shown include unfiled device data.  Last Pain:  Vitals:   08/06/23 1103  TempSrc: Tympanic      Patients Stated Pain Goal: 0 (07/29/23 2125)  Complications: No notable events documented.

## 2023-08-06 NOTE — Progress Notes (Signed)
Entered in error

## 2023-08-06 NOTE — Progress Notes (Signed)
Palliative Care Progress Note, Assessment & Plan   Patient Name: Rachel Huber       Date: 08/06/2023 DOB: 30-Aug-1955  Age: 67 y.o. MRN#: [id number] Attending Physician: Delfino Lovett, MD Primary Care Physician: Lauro Regulus, MD Admit Date: 07/28/2023  Subjective: Patient is lying in bed in mild distress.  She is awake and alert.  She acknowledges my presence.  She is severely jaundiced.  Her mouth is dry.  Her brow is furrowed and she is wincing.  She confirms pain in her leg.  No family or friends present during my visit.  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    12/12: Patient has been excepting her lactulose and having multiple bowel movements.  Mental status remains poor 12/13: No clinical status changes.  Patient is a little bit more awake.  Has been taking her p.o. lactulose. 12/15: Mental status seems close to baseline.  Waiting for PT and OT eval 12/16: PT and OT recommends SNF.  Husband refusing to take her back to [facility].  TOC working on alternative placement 12/17: Patient refusing lactulose at times.  Ammonia trending up 12/18: Lactulose enema was given as an option yesterday for oral lactulose when she is not able to take.  Ammonia 94 12/19: Patient reportedly fell overnight/early morning and has new left intertrochanteric  hip fracture.  Going for surgery/OR today  Summary of counseling/coordination of care: Extensive chart review completed prior to meeting patient including labs, vital signs, imaging, progress notes, orders, and available advanced directive documents from current and previous encounters.   After reviewing the patient's chart and assessing the patient at bedside, I attempted to complete a pain assessment with patient.  She is unable to participate in pain assessment completely and fully at this time.  She makes no vocalizations during our discussion.  I question her ability to understand what we are discussing as she was nodding her head but appropriately to yes/no questions.   No adjustment to [facility] at this time as patient scheduled for OR/surgery today.  PMT remains available to patient and family throughout her hospitalization.  Physical Exam Vitals reviewed.  Constitutional:      General: She is not in acute distress. HENT:     Mouth/Throat:     Mouth: Mucous membranes are dry.  Eyes:     Pupils: Pupils are equal, round, and reactive to light.  Abdominal:     Palpations: Abdomen is soft.  Musculoskeletal:     Comments: Pain with movement of LLE Generalized weakness  Skin:    Coloration: Skin is jaundiced.  Neurological:     Mental Status: She is alert.             Total Time 25 minutes   Time spent includes: Detailed review of medical records (labs, imaging, vital signs), medically  appropriate exam (mental status, respiratory, cardiac, skin), discussed with treatment team, counseling and educating patient, family and staff, documenting clinical information, medication management and coordination of care.  Samara Deist L. Bonita Quin, DNP, FNP-BC Palliative Medicine Team

## 2023-08-06 NOTE — Anesthesia Postprocedure Evaluation (Signed)
Anesthesia Post Note  Patient: Rachel Huber  Procedure(s) Performed: INTRAMEDULLARY (IM) NAIL INTERTROCHANTERIC (Left)  Patient location during evaluation: PACU Anesthesia Type: General Level of consciousness: awake and confused Pain management: pain level controlled Vital Signs Assessment: post-procedure vital signs reviewed and stable Respiratory status: spontaneous breathing, nonlabored ventilation, respiratory function stable and patient connected to nasal cannula oxygen Cardiovascular status: blood pressure returned to baseline and stable Postop Assessment: no apparent nausea or vomiting Anesthetic complications: no   No notable events documented.   Last Vitals:  Vitals:   08/06/23 1347 08/06/23 1405  BP: (!) 124/59 119/62  Pulse: (!) 110 (!) 108  Resp: 17 16  Temp:    SpO2: 94% 100%    Last Pain:  Vitals:   08/06/23 1345  TempSrc:   PainSc: Asleep                 Louie Boston

## 2023-08-06 NOTE — Progress Notes (Addendum)
       CROSS COVER NOTE  NAME: Rachel Huber MRN: [id number] DOB : 1956/04/07    Concern as stated by nurse / staff   Page from RN patient had fallen and hit her head     Pertinent findings on chart review: reviewed  Assessment and  Interventions   Assessment:    08/06/2023    3:51 AM 08/05/2023   11:00 PM 08/05/2023    4:13 PM  Vitals with BMI  Systolic 140 124 469  Diastolic 58 59 53  Pulse 91 82 83   Patient encephalopathic does answer some simple questions appropriately Pupils equally round  Apparently able to move all extremities, no commands followed Left frontal contusion with hematoma/edema amall scrape also present Left hip with severe pain with any movement Small skin tear right forearm Plan: Stat head CT - negative for acute findings Xray left hip - Acute intertrochanteric left femur fracture with varus angulation. Ortho consult. Dr Joice Lofts on call, secure chat sent and paged; place foley catheter Telemonitoring Relocate to close to nursing station Ice pack to head injury Cbc, bmp and PT/INR added to am labs this am       Donnie Mesa NP [facility] Cross Cover 7pm-7am - check amion for availability Pager [phone]

## 2023-08-06 NOTE — Discharge Instructions (Signed)
     Discharge Instructions    Attending Surgeon: Huel Cote, MD Office Phone Number: [phone]   Diagnosis and Procedures:    Surgeries Performed: Left hip cephulomedullary nail  Discharge Plan:    Diet: Resume usual diet. Begin with light or bland foods.  Drink plenty of fluids.  Activity:  Weight bearing as tolerated. You are advised to go home directly from the hospital or surgical center. Restrict your activities.  GENERAL INSTRUCTIONS: 1.  Please apply ice to your wound to help with swelling and inflammation. This will improve your comfort and your overall recovery following surgery.     2. Please call Dr. Serena Croissant office at [phone] with questions Monday-Friday during business hours. If no one answers, please leave a message and someone should get back to the patient within 24 hours. For emergencies please call 911 or proceed to the emergency room.   3. Patient to notify surgical team if experiences any of the following: Bowel/Bladder dysfunction, uncontrolled pain, nerve/muscle weakness, incision with increased drainage or redness, nausea/vomiting and Fever greater than 101.0 F.  Be alert for signs of infection including redness, streaking, odor, fever or chills. Be alert for excessive pain or bleeding and notify your surgeon immediately.  WOUND INSTRUCTIONS:   Leave your dressing, cast, or splint in place until your post operative visit.  Keep it clean and dry.  Always keep the incision clean and dry until the staples/sutures are removed. If there is no drainage from the incision you should keep it open to air. If there is drainage from the incision you must keep it covered at all times until the drainage stops  Do not soak in a bath tub, hot tub, pool, lake or other body of water until 21 days after your surgery and your incision is completely dry and healed.  If you have removable sutures (or staples) they must be removed 10-14 days (unless otherwise  instructed) from the day of your surgery.     1)  Elevate the extremity as much as possible.  2)  Keep the dressing clean and dry.  3)  Please call us if the dressing becomes wet or dirty.  4)  If you are experiencing worsening pain or worsening swelling, please call.

## 2023-08-07 ENCOUNTER — Encounter: Payer: Self-pay | Admitting: Orthopaedic Surgery

## 2023-08-07 DIAGNOSIS — D561 Beta thalassemia: Secondary | ICD-10-CM | POA: Diagnosis not present

## 2023-08-07 DIAGNOSIS — F418 Other specified anxiety disorders: Secondary | ICD-10-CM | POA: Diagnosis not present

## 2023-08-07 DIAGNOSIS — K7682 Hepatic encephalopathy: Secondary | ICD-10-CM | POA: Diagnosis not present

## 2023-08-07 DIAGNOSIS — S72142S Displaced intertrochanteric fracture of left femur, sequela: Secondary | ICD-10-CM | POA: Diagnosis not present

## 2023-08-07 DIAGNOSIS — N1832 Chronic kidney disease, stage 3b: Secondary | ICD-10-CM | POA: Diagnosis not present

## 2023-08-07 DIAGNOSIS — E785 Hyperlipidemia, unspecified: Secondary | ICD-10-CM | POA: Diagnosis not present

## 2023-08-07 LAB — COMPREHENSIVE METABOLIC PANEL
ALT: 19 U/L (ref 0–44)
AST: 36 U/L (ref 15–41)
Albumin: 2.8 g/dL — ABNORMAL LOW (ref 3.5–5.0)
Alkaline Phosphatase: 91 U/L (ref 38–126)
Anion gap: 12 (ref 5–15)
BUN: 43 mg/dL — ABNORMAL HIGH (ref 8–23)
CO2: 25 mmol/L (ref 22–32)
Calcium: 9.8 mg/dL (ref 8.9–10.3)
Chloride: 105 mmol/L (ref 98–111)
Creatinine, Ser: 2.24 mg/dL — ABNORMAL HIGH (ref 0.44–1.00)
GFR, Estimated: 24 mL/min — ABNORMAL LOW (ref >60)
Glucose, Bld: 232 mg/dL — ABNORMAL HIGH (ref 70–99)
Potassium: 4.7 mmol/L (ref 3.5–5.1)
Sodium: 142 mmol/L (ref 135–145)
Total Bilirubin: 1.9 mg/dL — ABNORMAL HIGH (ref <1.2)
Total Protein: 6 g/dL — ABNORMAL LOW (ref 6.5–8.1)

## 2023-08-07 LAB — CBC
HCT: 21.1 % — ABNORMAL LOW (ref 36.0–46.0)
Hemoglobin: 6.4 g/dL — ABNORMAL LOW (ref 12.0–15.0)
MCH: 22.4 pg — ABNORMAL LOW (ref 26.0–34.0)
MCHC: 30.3 g/dL (ref 30.0–36.0)
MCV: 73.8 fL — ABNORMAL LOW (ref 80.0–100.0)
Platelets: 210 10*3/uL (ref 150–400)
RBC: 2.86 MIL/uL — ABNORMAL LOW (ref 3.87–5.11)
RDW: 19.1 % — ABNORMAL HIGH (ref 11.5–15.5)
WBC: 13.4 10*3/uL — ABNORMAL HIGH (ref 4.0–10.5)
nRBC: 0.4 % — ABNORMAL HIGH (ref 0.0–0.2)

## 2023-08-07 LAB — PREPARE RBC (CROSSMATCH)

## 2023-08-07 LAB — AMMONIA: Ammonia: 81 umol/L — ABNORMAL HIGH (ref 9–35)

## 2023-08-07 MED ORDER — ACETAMINOPHEN 650 MG RE SUPP: 325.0000 mg | Freq: Four times a day (QID) | RECTAL | Status: DC | PRN

## 2023-08-07 MED ORDER — CEFAZOLIN SODIUM-DEXTROSE 2-4 GM/100ML-% IV SOLN
2.0000 g | Freq: Once | INTRAVENOUS | Status: AC
  Administered 2023-08-07: 2 g via INTRAVENOUS
  Filled 2023-08-07: qty 100

## 2023-08-07 MED ORDER — ASPIRIN 300 MG RE SUPP
300.0000 mg | Freq: Every day | RECTAL | Status: DC
  Administered 2023-08-07 – 2023-08-08 (×2): 300 mg via RECTAL
  Filled 2023-08-07 (×4): qty 1

## 2023-08-07 MED ORDER — SODIUM CHLORIDE 0.9 % IV SOLN: INTRAVENOUS | Status: DC

## 2023-08-07 MED ORDER — LACTULOSE ENEMA
300.0000 mL | Freq: Two times a day (BID) | ORAL | Status: DC
  Administered 2023-08-08: 300 mL via RECTAL
  Filled 2023-08-07 (×2): qty 300

## 2023-08-07 MED ORDER — LACTULOSE 10 GM/15ML PO SOLN
30.0000 g | Freq: Two times a day (BID) | ORAL | Status: DC
  Administered 2023-08-07: 30 g via ORAL
  Filled 2023-08-07: qty 60

## 2023-08-07 MED ORDER — SODIUM CHLORIDE 0.9% IV SOLUTION: Freq: Once | INTRAVENOUS | Status: DC

## 2023-08-07 MED ORDER — FOLIC ACID 5 MG/ML IJ SOLN
1.0000 mg | Freq: Every day | INTRAMUSCULAR | Status: DC
  Administered 2023-08-07 – 2023-08-08 (×2): 1 mg via INTRAVENOUS
  Filled 2023-08-07 (×2): qty 0.2

## 2023-08-07 MED ORDER — ACETAMINOPHEN 325 MG PO TABS
650.0000 mg | ORAL_TABLET | Freq: Four times a day (QID) | ORAL | Status: DC | PRN
  Filled 2023-08-07: qty 2

## 2023-08-07 MED ORDER — KETOROLAC TROMETHAMINE 15 MG/ML IJ SOLN
15.0000 mg | Freq: Three times a day (TID) | INTRAMUSCULAR | Status: AC | PRN
  Administered 2023-08-08 – 2023-08-11 (×4): 15 mg via INTRAVENOUS
  Filled 2023-08-07 (×4): qty 1

## 2023-08-07 NOTE — Progress Notes (Signed)
.    Subjective:  Subjective exam deferred due to fluctuating mental status caused by hepatic encephalopathy  Objective:   VITALS:   Vitals:   08/06/23 1449 08/06/23 1558 08/06/23 2113 08/07/23 0823  BP: 117/69 114/62 (!) 101/53 (!) 108/53  Pulse: (!) 111 (!) 117 (!) 110 99  Resp: 16 18 18 16   Temp: 97.9 F (36.6 C) 97.7 F (36.5 C) 98.1 F (36.7 C) 98.3 F (36.8 C)  TempSrc:   Oral   SpO2: 98% 100% 99% 100%  Weight:      Height:        2+ dorsalis pedis pulse  Dressing CDI  Limb lengths equal   Lab Results  Component Value Date   WBC 13.4 (H) 08/07/2023   HGB 6.4 (L) 08/07/2023   HCT 21.1 (L) 08/07/2023   MCV 73.8 (L) 08/07/2023   PLT 210 08/07/2023     Assessment/Plan:  1 Day Post-Op   - Expected postop acute blood loss anemia - will monitor for symptoms - Patient to work with PT/OT to optimize mobilization safely - DVT ppx - SCDs, ambulation - Postoperative Abx: Ancef x 2 additional doses given - WBAT operative extremity - Pain control - multimodal pain management, ATC acetaminophen in conjunction with as needed narcotic (oxycodone), although this should be minimized with other modalities  - Discharge planning pending CM, appreciate coordination     Jeanella Cara 08/07/2023, 9:26 AM

## 2023-08-07 NOTE — Plan of Care (Signed)
Patient still somnolent this am r/t surgical general anesthesia and encephalopathy. With family present patient was aroused repeatedly to take meds which were crushed in sherbet to avoid aspiration. Patient with extensive coaching took approx 50% of scheduled medications. After 50 min with patient to get medication down and to monitor for safety provider notified and IV medications requested. Patient became more easily aroused to voice eyes were open and pupils accommodating. Patient was able to get down fluids with RN remainder of shift without aspiration. Food was offered patient declined. Bed is locked in lowest position and call bell place under hand.       Problem: Clinical Measurements: Goal: Will remain free from infection Outcome: Progressing

## 2023-08-07 NOTE — Progress Notes (Signed)
Daily Progress Note   Patient Name: Rachel Huber       Date: 08/07/2023 DOB: 11-05-1955  Age: 67 y.o. 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    12/12: Patient has been excepting her lactulose and having multiple bowel movements.  Mental status remains poor 12/13: No clinical status changes.  Patient is a little bit more awake.  Has been taking her p.o. lactulose. 12/15: Mental status seems close to baseline.  Waiting for PT and OT eval 12/16: PT and OT recommends SNF.  Husband refusing to take her back to [facility].  TOC working on alternative placement 12/17: Patient refusing lactulose at times.  Ammonia trending up 12/18: Lactulose enema was given as an option yesterday for oral lactulose when she is not able to take.  Ammonia 94 12/19: Patient reportedly fell overnight/early morning and has new left intertrochanteric hip fracture.  Going for surgery/OR today   Subjective: Extensive chart review has been completed prior to meeting patient including labs, vital signs, imaging, progress notes, orders, and available  advanced directive documents from current and previous encounters.    Visited with Rachel Huber at her bedside, she is asleep in bed and does not acknowledge my presence in room. No family at bedside during time of visit.  Called and spoke with husband-Rachel Huber, he voices concern related to pain management and nutrition.  Reviewed MAR and collaborated with primary team and pharmacy to add PRN pain medications and to transition majority of medications to alternative routes as she is not able to tolerate PO well at this time.  Answered and addressed all questions and concerns. PMT to continue to follow for ongoing needs and support.   Care plan was discussed with primary team and nursing staff.  Thank you for allowing the  Palliative Medicine Team to assist in the care of this patient.  Total time:  35 minutes  Time spent includes: Detailed review of medical records (labs, imaging, vital signs), medically appropriate exam (mental status, respiratory, cardiac, skin), discussed with treatment team, counseling and educating patient, family and staff, documenting clinical information, medication management and coordination of care.  Leeanne Deed, DNP, AGNP-C Palliative Medicine   Please contact Palliative Medicine Team phone at [phone] for questions and concerns.

## 2023-08-07 NOTE — Progress Notes (Signed)
PT Cancellation Note  Patient Details Name: Rachel Huber MRN: [id number] DOB: [birth year]   Cancelled Treatment:    Reason Eval/Treat Not Completed: Medical issues which prohibited therapy (ABLA with Hb today at 6.4. Awaiting update from MD. WIll hold PT at this time.)  11:05 AM, 08/07/23 Rosamaria Lints, PT, DPT Physical Therapist - [facility]  [phone] (ASCOM)    Damon Baisch C 08/07/2023, 11:04 AM

## 2023-08-07 NOTE — Progress Notes (Signed)
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   12/12: Patient has been excepting her lactulose and having multiple bowel movements.  Mental status remains poor  12/13: No clinical status changes.  Patient is a little bit more awake.  Has been taking her p.o. lactulose. 12/15: Mental status seems close to baseline.  Waiting for PT and OT eval 12/16: PT and OT recommends SNF.  Husband refusing to take her back to [facility].  TOC working on alternative placement 12/17: Patient refusing lactulose at times.  Ammonia trending up.  MRI of the brain shows no acute pathology 12/18: Lactulose enema was given as an option yesterday for oral lactulose when she is not able to take.  Ammonia 94 12/19: Patient reportedly fell overnight/early morning and has new left intertrochanteric hip fracture status post Left hip cephalomedullary nail  12/20: 1 PRBC transfusion for hemoglobin of 6.4  Assessment & Plan:   Principal Problem:   Hepatic encephalopathy (HCC) Active Problems:   Chronic kidney disease, stage 3b (HCC)   HLD (hyperlipidemia)   Depression with anxiety   Type 2 diabetes mellitus with hyperlipidemia (HCC)   OSA on CPAP   Anemia in chronic kidney disease (CKD)   Hypokalemia   AMS (altered mental status)   Aortic atherosclerosis (HCC)   Beta thalassemia intermedia (HCC)   Obesity, Class III, BMI 40-49.9  (morbid obesity) (HCC)   Hypomagnesemia   Intertrochanteric fracture of left hip, sequela  Hepatic encephalopathy (HCC) Decompensated NASH cirrhosis Suspect secondary to patient refusal to take lactulose at nursing facility Continue oral lactulose 30 g 3 times daily Titrate to 3-4 soft bowel movements daily.  If having more bowel movements or diarrhea occurs will need to scale back on lactulose dosing Continue home rifaximin Patient refuses lactulose at times..  Ammonia 109->124-> 94-> 77.  Lactulose dose timing adjusted by palliative care team to improve compliance.  Acute left intertrochanteric hip fracture Status post fall on 12/19 around 4 AM.  Per report she was trying to get out of the bed by herself to go to the bathroom.   Underwent head CT which was negative for acute findings. Left hip x-ray showed acute fracture -status post left hip cephalomedullary nail in place orthopedics on 12/19 Pain medicine as needed as ordered Aspirin for DVT prophylaxis Weightbearing as tolerated on the left hip per orthopedic PT and OT evaluation  Acute on chronic kidney disease, stage 3b (HCC) Renal function slowly worsening, will monitor.  Continue IV hydration Lab Results  Component Value Date   CREATININE 2.24 (H) 08/07/2023   CREATININE 1.23 (H) 08/06/2023   CREATININE 1.06 (H) 08/04/2023  consult nephrology   Hypomagnesemia Monitor and replace as necessary   Obesity, Class III, BMI 40-49.9 (morbid obesity) (HCC) Complicating factor to overall care and prognosis   Acute metabolic encephalopathy  with history of frequent falls in setting of lactulose noncompliance.  Had fall overnight leading to new acute left hip fracture Fall precautions, aspiration precaution PT OT recommends  SNF -she had insurance Auth and bed availability for 12/19 but now that will be postponed due to new hip fracture Her MRI of the brain in September 2024 showed - 2.2 x 1.5 cm heavily calcified meningioma in  the right middle cranial fossa with some mass effect on the lateral aspect of the right temporal tip.  I discussed with husband and he shared that she had a follow-up regarding that and this was thought to be benign and nothing needed to be done MRI brain is unchanged from before   Hypokalemia Monitor and replace as necessary   Anemia in chronic kidney disease (CKD) Currently no clinical concerns for bleeding at this time.  Hemoglobin 6.4.  Will transfuse 1 PRBC today Patient's baseline range over the last month has been 7.1-9.1 Could be some blood loss during surgery   OSA on CPAP CPAP nightly ordered on admission   DVT prophylaxis: Heparin subcu Code Status: Full Family Communication: Husband updated over phone Disposition Plan: Status is: Inpatient Remains inpatient appropriate because: Hepatic encephalopathy, decompensated hepatic cirrhosis, new left hip fracture, anemia   Level of care: Telemetry Medical   Subjective:  She is obtunded.  Hemoglobin 6.4  Objective: Vitals:   08/06/23 1449 08/06/23 1558 08/06/23 2113 08/07/23 0823  BP: 117/69 114/62 (!) 101/53 (!) 108/53  Pulse: (!) 111 (!) 117 (!) 110 99  Resp: 16 18 18 16   Temp: 97.9 F (36.6 C) 97.7 F (36.5 C) 98.1 F (36.7 C) 98.3 F (36.8 C)  TempSrc:   Oral   SpO2: 98% 100% 99% 100%  Weight:      Height:        Intake/Output Summary (Last 24 hours) at 08/07/2023 1242 Last data filed at 08/07/2023 0500 Gross per 24 hour  Intake 600 ml  Output 200 ml  Net 400 ml     Filed Weights   07/28/23 1710  Weight: 135.2 kg    Examination: Overall difficult exam due to her confusion and agitation.  She does not cooperate General exam: She is in no acute distress Respiratory system: Decreased breath sounds at the bases normal work of breathing.  Cardiovascular system: S1-S2, RRR, no murmurs, no pedal edema Gastrointestinal system: Soft, benign Central nervous system: She is obtunded.  Barely arousable to  deep painful stimuli.  Nonfocal exam Extremities: Left hip in dressing.  No signs of infection     Data Reviewed: I have personally reviewed following labs and imaging studies  CBC: Recent Labs  Lab 08/01/23 0807 08/03/23 0230 08/04/23 0430 08/06/23 0835 08/07/23 0608  WBC 6.2 5.2 4.5 8.0 13.4*  NEUTROABS 3.5  --   --   --   --   HGB 7.8* 7.9* 7.9* 7.5* 6.4*  HCT 25.9* 25.8* 25.9* 24.7* 21.1*  MCV 74.9* 72.5* 74.0* 73.3* 73.8*  PLT 156 167 175 164 210   Basic Metabolic Panel: Recent Labs  Lab 08/01/23 0807 08/03/23 0230 08/04/23 0430 08/06/23 0835 08/07/23 0608  NA 144 139 142 142 142  K 3.5 3.6 3.8 3.5 4.7  CL 109 105 106 106 105  CO2 27 30 28  21* 25  GLUCOSE 117* 102* 115* 190* 232*  BUN 37* 30* 30* 32* 43*  CREATININE 1.14* 0.93 1.06* 1.23* 2.24*  CALCIUM 9.4 9.8 9.9 10.6* 9.8   GFR: Estimated Creatinine Clearance: 37.1 mL/min (A) (by C-G formula based on SCr of 2.24 mg/dL (H)). Liver Function Tests: Recent Labs  Lab 08/03/23 0230 08/06/23 0835 08/07/23 0608  AST 38 50*  36  ALT 21 30 19   ALKPHOS 87 97 91  BILITOT 2.9* 3.5* 1.9*  PROT 6.0* 6.2* 6.0*  ALBUMIN 3.0* 2.9* 2.8*   No results for input(s): "LIPASE", "AMYLASE" in the last 168 hours.  Recent Labs  Lab 08/03/23 0230 08/04/23 0430 08/05/23 0421 08/06/23 0504 08/07/23 0608  AMMONIA 109* 124* 94* 77* 81*       Scheduled Meds:  sodium chloride   Intravenous Once   aspirin  325 mg Oral Daily   Chlorhexidine Gluconate Cloth  6 each Topical Daily   cyanocobalamin  1,000 mcg Oral Daily   folic acid  1 mg Oral Daily   heparin  5,000 Units Subcutaneous Q8H   lactulose  30 g Oral TID   lactulose  300 mL Rectal BID   rifaximin  550 mg Oral BID   Continuous Infusions:   ceFAZolin (ANCEF) IV 2 g (08/06/23 2125)   She is critically sick and high risk for cardiorespiratory failure, multiorgan failure and death.   LOS: 10 days   Time spent 35 minutes Delfino Lovett, MD [facility]   If 7PM-7AM, please contact night-coverage  08/07/2023, 12:42 PM

## 2023-08-07 NOTE — Plan of Care (Signed)
  Pt is drowsy this shift. Non-interactive and confused. Moans with movement/ PRN med given. Falls precaution maintained.     Problem: Education: Goal: Knowledge of General Education information will improve Description: Including pain rating scale, medication(s)/side effects and non-pharmacologic comfort measures Outcome: Progressing   Problem: Clinical Measurements: Goal: Ability to maintain clinical measurements within normal limits will improve Outcome: Progressing Goal: Will remain free from infection Outcome: Progressing Goal: Diagnostic test results will improve Outcome: Progressing Goal: Respiratory complications will improve Outcome: Progressing Goal: Cardiovascular complication will be avoided Outcome: Progressing   Problem: Nutrition: Goal: Adequate nutrition will be maintained Outcome: Progressing   Problem: Nutrition: Goal: Adequate nutrition will be maintained Outcome: Progressing   Problem: Elimination: Goal: Will not experience complications related to bowel motility Outcome: Progressing   Problem: Elimination: Goal: Will not experience complications related to bowel motility Outcome: Progressing Goal: Will not experience complications related to urinary retention Outcome: Progressing

## 2023-08-07 NOTE — Progress Notes (Signed)
[facility] Liaison note:   This patient is currently enrolled in [facility] outpatient-based palliative care.  [facility] will continue to follow for discharge disposition.    Please call for any outpatient based palliative care related questions or concerns.   [facility] Liaison [phone]

## 2023-08-08 ENCOUNTER — Inpatient Hospital Stay: Payer: Medicare Other

## 2023-08-08 DIAGNOSIS — S72142S Displaced intertrochanteric fracture of left femur, sequela: Secondary | ICD-10-CM

## 2023-08-08 DIAGNOSIS — D631 Anemia in chronic kidney disease: Secondary | ICD-10-CM | POA: Diagnosis not present

## 2023-08-08 DIAGNOSIS — K7682 Hepatic encephalopathy: Secondary | ICD-10-CM | POA: Diagnosis not present

## 2023-08-08 DIAGNOSIS — N1832 Chronic kidney disease, stage 3b: Secondary | ICD-10-CM | POA: Diagnosis not present

## 2023-08-08 LAB — AMMONIA
Ammonia: 463 umol/L — ABNORMAL HIGH (ref 9–35)
Ammonia: 48 umol/L — ABNORMAL HIGH (ref 9–35)

## 2023-08-08 LAB — CBC
HCT: 18.5 % — ABNORMAL LOW (ref 36.0–46.0)
Hemoglobin: 5.4 g/dL — ABNORMAL LOW (ref 12.0–15.0)
MCH: 22 pg — ABNORMAL LOW (ref 26.0–34.0)
MCHC: 29.2 g/dL — ABNORMAL LOW (ref 30.0–36.0)
MCV: 75.2 fL — ABNORMAL LOW (ref 80.0–100.0)
Platelets: 177 10*3/uL (ref 150–400)
RBC: 2.46 MIL/uL — ABNORMAL LOW (ref 3.87–5.11)
RDW: 18.7 % — ABNORMAL HIGH (ref 11.5–15.5)
WBC: 9.1 10*3/uL (ref 4.0–10.5)
nRBC: 0.3 % — ABNORMAL HIGH (ref 0.0–0.2)

## 2023-08-08 LAB — BASIC METABOLIC PANEL
Anion gap: 7 (ref 5–15)
BUN: 50 mg/dL — ABNORMAL HIGH (ref 8–23)
CO2: 26 mmol/L (ref 22–32)
Calcium: 9.6 mg/dL (ref 8.9–10.3)
Chloride: 111 mmol/L (ref 98–111)
Creatinine, Ser: 2.02 mg/dL — ABNORMAL HIGH (ref 0.44–1.00)
GFR, Estimated: 27 mL/min — ABNORMAL LOW (ref >60)
Glucose, Bld: 160 mg/dL — ABNORMAL HIGH (ref 70–99)
Potassium: 4 mmol/L (ref 3.5–5.1)
Sodium: 144 mmol/L (ref 135–145)

## 2023-08-08 LAB — PREPARE RBC (CROSSMATCH)

## 2023-08-08 LAB — HEPATIC FUNCTION PANEL
ALT: 10 U/L (ref 0–44)
AST: 42 U/L — ABNORMAL HIGH (ref 15–41)
Albumin: 2.5 g/dL — ABNORMAL LOW (ref 3.5–5.0)
Alkaline Phosphatase: 90 U/L (ref 38–126)
Bilirubin, Direct: 0.6 mg/dL — ABNORMAL HIGH (ref 0.0–0.2)
Indirect Bilirubin: 1.3 mg/dL — ABNORMAL HIGH (ref 0.3–0.9)
Total Bilirubin: 1.9 mg/dL — ABNORMAL HIGH (ref <1.2)
Total Protein: 5.6 g/dL — ABNORMAL LOW (ref 6.5–8.1)

## 2023-08-08 LAB — GLUCOSE, CAPILLARY: Glucose-Capillary: 162 mg/dL — ABNORMAL HIGH (ref 70–99)

## 2023-08-08 LAB — HEMOGLOBIN: Hemoglobin: 6.5 g/dL — ABNORMAL LOW (ref 12.0–15.0)

## 2023-08-08 LAB — PROTIME-INR
INR: 2.6 — ABNORMAL HIGH (ref 0.8–1.2)
Prothrombin Time: 27.7 s — ABNORMAL HIGH (ref 11.4–15.2)

## 2023-08-08 MED ORDER — FOLIC ACID 1 MG PO TABS
1.0000 mg | ORAL_TABLET | Freq: Every day | ORAL | Status: DC
Start: 2023-08-09 — End: 2023-08-09
  Administered 2023-08-09: 1 mg via ORAL
  Filled 2023-08-08: qty 1

## 2023-08-08 MED ORDER — PANTOPRAZOLE SODIUM 40 MG IV SOLR
40.0000 mg | Freq: Two times a day (BID) | INTRAVENOUS | Status: DC
  Administered 2023-08-08 (×2): 40 mg via INTRAVENOUS
  Filled 2023-08-08 (×2): qty 10

## 2023-08-08 MED ORDER — SODIUM CHLORIDE 0.9% IV SOLUTION: Freq: Once | INTRAVENOUS | Status: AC

## 2023-08-08 MED ORDER — SODIUM CHLORIDE 0.9 % IV SOLN
2.0000 g | INTRAVENOUS | Status: DC
  Administered 2023-08-08 – 2023-08-09 (×2): 2 g via INTRAVENOUS
  Filled 2023-08-08 (×2): qty 20

## 2023-08-08 MED ORDER — LACTULOSE ENEMA
300.0000 mL | Freq: Four times a day (QID) | ORAL | Status: DC
  Filled 2023-08-08 (×6): qty 300

## 2023-08-08 MED ORDER — LACTULOSE 10 GM/15ML PO SOLN
30.0000 g | Freq: Four times a day (QID) | ORAL | Status: DC
  Administered 2023-08-08 – 2023-08-09 (×4): 30 g via ORAL
  Filled 2023-08-08 (×4): qty 60

## 2023-08-08 NOTE — Plan of Care (Signed)

## 2023-08-08 NOTE — Progress Notes (Signed)
OT Cancellation Note  Patient Details Name: ARYANNAH CADAVID MRN: [id number] DOB: May 29, 1956   Cancelled Treatment:    Reason Eval/Treat Not Completed: Medical issues which prohibited therapy (Pt's hemoglobin 5.4.)  Alvester Morin 08/08/2023, 8:04 AM

## 2023-08-08 NOTE — Progress Notes (Signed)
   08/08/23 0700  Spiritual Encounters  Type of Visit Initial;Attempt (pt unavailable)  Referral source Code page  Reason for visit Code  OnCall Visit Yes  Spiritual Care Plan  Spiritual Care Issues Still Outstanding Referring to oncoming chaplain for further support   Responded to a code Blue patient had no family present. Patient was in no condition to see an Chaplain at that time.

## 2023-08-08 NOTE — Consult Note (Signed)
[facility]  CONSULT NOTE    Date: 08/08/2023                  Patient Name:  Rachel Huber  MRN: [id number]  DOB: 05-Apr-1956  Age / Sex: 67 y.o., female         PCP: Lauro Regulus, MD                 Service Requesting Consult: [facility]                 Reason for Consult: Acute kidney injury            History of Present Illness: Rachel Huber is a 67 y.o.  female with past medical condition including arthritis, anemia, GERD, hypertension, portal hypertension, cirrhosis, and acute kidney injury stage IIIa., who was admitted to [facility] on 07/28/2023 for Hepatic encephalopathy [facility]) [K76.82]  Patient presented to the emergency department with altered mental status.  Patient with known liver cirrhosis and resides in facility.  Appears patient has not been taking her scheduled lactulose as ordered.  Patient did suffer a fall during this admission resulting in a left intertrochanteric hip fracture.  Patient underwent surgery on 08/06/2023 to repair this.  We have been consulted due to deteriorating renal function.  Renal function at baseline on admission and currently peaked at 2.24.  Appears creatinine was stable on 12/19 however increased on 12/20.   Medications: Outpatient medications: Medications Prior to Admission  Medication Sig Dispense Refill Last Dose/Taking   butalbital-acetaminophen-caffeine (FIORICET) 50-325-40 MG tablet Take 1 tablet by mouth every 8 (eight) hours as needed for headache.   unknown   colchicine 0.6 MG tablet Take 1 tablet (0.6 mg total) by mouth 2 (two) times daily. 60 tablet 0 unknown   cyanocobalamin (VITAMIN B12) 500 MCG tablet Take 1,000 mcg by mouth daily.   unknown   ferrous sulfate 325 (65 FE) MG tablet Take 325 mg by mouth daily.   unknown   folic acid (FOLVITE) 1 MG tablet Take 1 tablet (1 mg total) by mouth daily. 30 tablet 6 unknown   [EXPIRED] lactulose (CHRONULAC) 10 GM/15ML solution Take 67.5 mLs (45 g total) by  mouth 3 (three) times daily for 9 days. (Patient taking differently: Take 70 mLs by mouth 3 (three) times daily.) 946 mL 1 unknown   potassium chloride SA (KLOR-CON M) 20 MEQ tablet Take 20 mEq by mouth daily.   unknown   sertraline (ZOLOFT) 50 MG tablet Take 1 tablet (50 mg total) by mouth daily. 30 tablet 0 unknown   torsemide (DEMADEX) 20 MG tablet Take 2 tablets (40 mg total) by mouth daily. 30 tablet 0 unknown   Vitamin D, Ergocalciferol, (DRISDOL) 1.25 MG (50000 UNIT) CAPS capsule Take 1 capsule (50,000 Units total) by mouth once a week. 5 capsule 0 unknown   XIFAXAN 550 MG TABS tablet Take 1 tablet (550 mg total) by mouth 2 (two) times daily. 60 tablet 0 unknown    Current medications: Current Facility-Administered Medications  Medication Dose Route Frequency Provider Last Rate Last Admin   0.9 %  sodium chloride infusion   Intravenous Continuous Marrion Coy, MD 75 mL/hr at 08/08/23 0927 New Bag at 08/08/23 0927   acetaminophen (TYLENOL) suppository 325 mg  325 mg Rectal Q6H PRN Delfino Lovett, MD       Or   acetaminophen (TYLENOL) tablet 650 mg  650 mg Oral Q6H PRN Delfino Lovett, MD  aspirin suppository 300 mg  300 mg Rectal Daily Delfino Lovett, MD   300 mg at 08/08/23 1016   butalbital-acetaminophen-caffeine (FIORICET) 50-325-40 MG per tablet 1 tablet  1 tablet Oral Q6H PRN Huel Cote, MD   1 tablet at 08/07/23 1238   cefTRIAXone (ROCEPHIN) 2 g in sodium chloride 0.9 % 100 mL IVPB  2 g Intravenous Q24H Marrion Coy, MD 200 mL/hr at 08/08/23 1156 2 g at 08/08/23 1156   Chlorhexidine Gluconate Cloth 2 % PADS 6 each  6 each Topical Daily Huel Cote, MD   6 each at 08/08/23 0948   cyanocobalamin (VITAMIN B12) tablet 1,000 mcg  1,000 mcg Oral Daily Huel Cote, MD   1,000 mcg at 08/08/23 1016   folic acid injection 1 mg  1 mg Intravenous Daily Delfino Lovett, MD   1 mg at 08/08/23 1016   heparin injection 5,000 Units  5,000 Units Subcutaneous Q8H Huel Cote, MD   5,000 Units  at 08/08/23 0525   ketorolac (TORADOL) 15 MG/ML injection 15 mg  15 mg Intravenous Q8H PRN Theotis Burrow, NP   15 mg at 08/08/23 1145   lactulose (CHRONULAC) enema 200 gm  300 mL Rectal QID Jaynie Collins, DO       Or   lactulose (CHRONULAC) 10 GM/15ML solution 30 g  30 g Oral QID Jaynie Collins, DO       pantoprazole (PROTONIX) injection 40 mg  40 mg Intravenous Gaynelle Cage, MD   40 mg at 08/08/23 1144   rifaximin (XIFAXAN) tablet 550 mg  550 mg Oral BID Huel Cote, MD   550 mg at 08/08/23 1016      Allergies: Allergies  Allergen Reactions   Latex Rash    Contact rash   Tape Other (See Comments)    Skin sensitivity   Gramineae Pollens Other (See Comments)    Sneezing, Running nose  Other Reaction(s): Other (see comments)      Past Medical History: Past Medical History:  Diagnosis Date   Anemia in chronic kidney disease (CKD) 02/07/2022   Anxiety    Arthritis    Ascites of liver    Weekly paracentesis   Cirrhosis of liver (HCC)    CKD (chronic kidney disease) stage 4, GFR 15-29 ml/min (HCC)    GERD (gastroesophageal reflux disease)    Grade IV internal hemorrhoids    Hypertension    NASH (nonalcoholic steatohepatitis)    Neuropathy, diabetic (HCC)    Obesity (BMI 30-39.9)    OSA on CPAP    Pneumonia    Portal hypertension (HCC)    S/P TIPS (transjugular intrahepatic portosystemic shunt)    Thalassemia minor 1992     Past Surgical History: Past Surgical History:  Procedure Laterality Date   ABDOMINAL HYSTERECTOMY     BREAST BIOPSY Right 02/15/2018   [place] bx 6-6:30 ribbon shape, ONE CORE FRAGMENT WITH FIBROSIS. ONE CORE FRAGMENT WITH PORTION OF A DILATED   BREAST BIOPSY Right 02/15/2018   [place] bx 9:00 heart shape, USUAL DUCTAL HYPERPLASIA   BREAST LUMPECTOMY Right 03/09/2018   Procedure: BREAST LUMPECTOMY x 2;  Surgeon: Sung Amabile, DO;  Location: [facility] ORS;  Service: General;  Laterality: Right;   CATARACT EXTRACTION W/PHACO Left  06/11/2016   Procedure: CATARACT EXTRACTION PHACO AND INTRAOCULAR LENS PLACEMENT (IOC);  Surgeon: Sallee Lange, MD;  Location: [facility] ORS;  Service: Ophthalmology;  Laterality: Left;  Lot # [id number] H US:01:38.6 AP%:26.4 CDE:44.15   CATARACT EXTRACTION W/PHACO Right 06/15/2018  Procedure: CATARACT EXTRACTION PHACO AND INTRAOCULAR LENS PLACEMENT (IOC);  Surgeon: Galen Manila, MD;  Location: [facility] ORS;  Service: Ophthalmology;  Laterality: Right;  [place] 00:38.2 CDE 4.23 Fluid Pack Lot # [id number]   COLONOSCOPIES     COLONOSCOPY WITH PROPOFOL N/A 10/10/2020   Procedure: COLONOSCOPY WITH PROPOFOL;  Surgeon: Regis Bill, MD;  Location[facility] END[facility]COPY;  Service: Endoscopy;  Laterality: N/A;   COLONOSCOPY WITH PROPOFOL N/A 11/20/2020   Procedure: COLONOSCOPY WITH PROPOFOL;  Surgeon: Regis Bill, MD;  Location: [facility] ENDOSCOPY;  Service: Endoscopy;  Laterality: N/A;  DM STAT CBC, BMP COVID POSITIVE 09/02/2020   COLONOSCOPY WITH PROPOFOL N/A 06/19/2022   Procedure: COLONOSCOPY WITH PROPOFOL;  Surgeon: Regis Bill, MD;  Location: [facility] ENDOSCOPY;  Service: Endoscopy;  Laterality: N/A;   CTR     ESOPHAGOGASTRODUODENOSCOPY (EGD) WITH PROPOFOL N/A 10/10/2020   Procedure: ESOPHAGOGASTRODUODENOSCOPY (EGD) WITH PROPOFOL;  Surgeon: Regis Bill, MD;  Location: [facility] ENDOSCOPY;  Service: Endoscopy;  Laterality: N/A;  COVID POSITIVE 10/08/2020   FLEXIBLE SIGMOIDOSCOPY N/A 02/06/2022   Procedure: FLEXIBLE SIGMOIDOSCOPY;  Surgeon: Regis Bill, MD;  Location: [facility] ENDOSCOPY;  Service: Endoscopy;  Laterality: N/A;  Patient requests anesthesia   FLEXIBLE SIGMOIDOSCOPY N/A 12/19/2022   Procedure: FLEXIBLE SIGMOIDOSCOPY;  Surgeon: Dolores Frame, MD;  Location: [facility];  Service: Gastroenterology;  Laterality: N/A;   INTRAMEDULLARY (IM) NAIL INTERTROCHANTERIC Left 08/06/2023   Procedure: INTRAMEDULLARY (IM) NAIL INTERTROCHANTERIC;  Surgeon: Huel Cote, MD;  Location:  [facility] ORS;  Service: Orthopedics;  Laterality: Left;   IR ANGIOGRAM SELECTIVE EACH ADDITIONAL VESSEL  02/07/2022   IR ANGIOGRAM SELECTIVE EACH ADDITIONAL VESSEL  02/07/2022   IR ANGIOGRAM SELECTIVE EACH ADDITIONAL VESSEL  09/05/2022   IR ANGIOGRAM VISCERAL SELECTIVE  02/07/2022   IR ANGIOGRAM VISCERAL SELECTIVE  09/05/2022   IR CV LINE INJECTION  02/24/2023   IR EMBO ART  VEN HEMORR LYMPH EXTRAV  INC GUIDE ROADMAPPING  09/05/2022   IR EMBO ART  VEN HEMORR LYMPH EXTRAV  INC GUIDE ROADMAPPING  11/05/2022   IR EMBO ARTERIAL NOT HEMORR HEMANG INC GUIDE ROADMAPPING  02/07/2022   IR EMBO TUMOR ORGAN ISCHEMIA INFARCT INC GUIDE ROADMAPPING  01/06/2023   IR IMAGING GUIDED PORT INSERTION  06/13/2022   IR IMAGING GUIDED PORT INSERTION  11/25/2022   IR PARACENTESIS  12/17/2021   IR PARACENTESIS  03/25/2022   IR PARACENTESIS  11/25/2022   IR PARACENTESIS  01/06/2023   IR PARACENTESIS  01/19/2023   IR RADIOLOGIST EVAL & MGMT  03/18/2022   IR RADIOLOGIST EVAL & MGMT  05/07/2022   IR RADIOLOGIST EVAL & MGMT  05/16/2022   IR RADIOLOGIST EVAL & MGMT  10/13/2022   IR RADIOLOGIST EVAL & MGMT  12/12/2022   IR RADIOLOGIST EVAL & MGMT  03/30/2023   IR US GUIDE VASC ACCESS RIGHT  02/07/2022   IR US GUIDE VASC ACCESS RIGHT  09/05/2022   JOINT REPLACEMENT     KNEE SURGERY Right 09/24/2017   plates and pins   PORT-A-CATH REMOVAL N/A 09/23/2022   Procedure: MINOR REMOVAL PORT-A-CATH;  Surgeon: Franky Macho, MD;  Location: [facility];  Service: General;  Laterality: N/A;   TEE WITHOUT CARDIOVERSION N/A 09/23/2022   Procedure: TRANSESOPHAGEAL ECHOCARDIOGRAM (TEE);  Surgeon: Antoine Poche, MD;  Location: [facility];  Service: Endoscopy;  Laterality: N/A;   TOTAL SHOULDER ARTHROPLASTY Right 09/27/2015     Family History: Family History  Problem Relation Age of Onset   Breast cancer Mother 6  Lymphoma Mother    Diabetes Father    Kidney cancer Father    Heart disease Father    Diabetes Sister    Breast cancer Sister 63     Social  History: Social History   Socioeconomic History   Marital status: Married    Spouse name: Not on file   Number of children: 0   Years of education: Not on file   Highest education level: Not on file  Occupational History   Occupation: EMPLOYED  Tobacco Use   Smoking status: Former    Types: Cigarettes   Smokeless tobacco: Never   Tobacco comments:    Smoked "some" in college  Vaping Use   Vaping status: Never Used  Substance and Sexual Activity   Alcohol use: No    Comment: minimal alcohol intake in college   Drug use: Never   Sexual activity: Not Currently  Other Topics Concern   Not on file  Social History Narrative   Not on file   Social Drivers of Health   Financial Resource Strain: Low Risk  (06/03/2023)   Received from [facility]   Overall Financial Resource Strain (CARDIA)    Difficulty of Paying Living Expenses: Not very hard  Food Insecurity: Patient Unable To Answer (07/29/2023)   Hunger Vital Sign    Worried About Running Out of Food in the Last Year: Patient unable to answer    Ran Out of Food in the Last Year: Patient unable to answer  Transportation Needs: Patient Unable To Answer (07/29/2023)   PRAPARE - Transportation    Lack of Transportation (Medical): Patient unable to answer    Lack of Transportation (Non-Medical): Patient unable to answer  Physical Activity: Inactive (04/04/2022)   Exercise Vital Sign    Days of Exercise per Week: 0 days    Minutes of Exercise per Session: 0 min  Stress: No Stress Concern Present (04/04/2022)   [employer] of Occupational Health - Occupational Stress Questionnaire    Feeling of Stress : Only a little  Social Connections: Moderately Isolated (04/04/2022)   Social Connection and Isolation Panel [NHANES]    Frequency of Communication with Friends and Family: More than three times a week    Frequency of Social Gatherings with Friends and Family: Three times a week    Attends Religious  Services: Never    Active Member of Clubs or Organizations: No    Attends Banker Meetings: Never    Marital Status: Married  Catering manager Violence: Patient Unable To Answer (07/29/2023)   Humiliation, Afraid, Rape, and Kick questionnaire    Fear of Current or Ex-Partner: Patient unable to answer    Emotionally Abused: Patient unable to answer    Physically Abused: Patient unable to answer    Sexually Abused: Patient unable to answer     Review of Systems: Review of Systems  Unable to perform ROS: Mental status change    Vital Signs: Blood pressure (!) 136/46, pulse 87, temperature 98 F (36.7 C), temperature source Oral, resp. rate 20, height 5\' 9"  (1.753 m), weight 135.2 kg, SpO2 100%.  Weight trends: Filed Weights   07/28/23 1710  Weight: 135.2 kg    Physical Exam: General: Ill appearing  Head: Normocephalic, atraumatic.   Eyes: Anicteric  Lungs:  Clear to auscultation, [place] O2  Heart: Regular rate and rhythm  Abdomen:  Soft, nontender  Extremities:  trace peripheral edema.  Neurologic: Arousable for short periods  Skin: No lesions  Lab results: Basic Metabolic Panel: Recent Labs  Lab 08/06/23 0835 08/07/23 0608 08/08/23 0601  NA 142 142 144  K 3.5 4.7 4.0  CL 106 105 111  CO2 21* 25 26  GLUCOSE 190* 232* 160*  BUN 32* 43* 50*  CREATININE 1.23* 2.24* 2.02*  CALCIUM 10.6* 9.8 9.6    Liver Function Tests: Recent Labs  Lab 08/03/23 0230 08/06/23 0835 08/07/23 0608  AST 38 50* 36  ALT 21 30 19   ALKPHOS 87 97 91  BILITOT 2.9* 3.5* 1.9*  PROT 6.0* 6.2* 6.0*  ALBUMIN 3.0* 2.9* 2.8*   No results for input(s): "LIPASE", "AMYLASE" in the last 168 hours. Recent Labs  Lab 08/06/23 0504 08/07/23 0608 08/08/23 0601  AMMONIA 77* 81* 463*    CBC: Recent Labs  Lab 08/03/23 0230 08/04/23 0430 08/06/23 0835 08/07/23 0608 08/08/23 0601  WBC 5.2 4.5 8.0 13.4* 9.1  HGB 7.9* 7.9* 7.5* 6.4* 5.4*  HCT 25.8* 25.9* 24.7* 21.1* 18.5*   MCV 72.5* 74.0* 73.3* 73.8* 75.2*  PLT 167 175 164 210 177    Cardiac Enzymes: No results for input(s): "CKTOTAL", "CKMB", "CKMBINDEX", "TROPONINI" in the last 168 hours.  BNP: Invalid input(s): "POCBNP"  CBG: Recent Labs  Lab 08/06/23 1404 08/06/23 2241 08/08/23 0658  GLUCAP 198* 250* 162*    Microbiology: Results for orders placed or performed during the hospital encounter of 04/29/23  Resp panel by RT-PCR (RSV, Flu A&B, Covid) Urine, In & Out Cath     Status: None   Collection Time: 04/29/23  3:33 PM   Specimen: Urine, In & Out Cath; Nasal Swab  Result Value Ref Range Status   SARS Coronavirus 2 by RT PCR NEGATIVE NEGATIVE Final    Comment: (NOTE) SARS-CoV-2 target nucleic acids are NOT DETECTED.  The SARS-CoV-2 RNA is generally detectable in upper respiratory specimens during the acute phase of infection. The lowest concentration of SARS-CoV-2 viral copies this assay can detect is 138 copies/mL. A negative result does not preclude SARS-Cov-2 infection and should not be used as the sole basis for treatment or other patient management decisions. A negative result may occur with  improper specimen collection/handling, submission of specimen other than nasopharyngeal swab, presence of viral mutation(s) within the areas targeted by this assay, and inadequate number of viral copies(<138 copies/mL). A negative result must be combined with clinical observations, patient history, and epidemiological information. The expected result is Negative.  Fact Sheet for Patients:  BloggerCourse.com  Fact Sheet for Healthcare Providers:  SeriousBroker.it  This test is no t yet approved or cleared by the [place] FDA and  has been authorized for detection and/or diagnosis of SARS-CoV-2 by FDA under an Emergency Use Authorization (EUA). This EUA will remain  in effect (meaning this test can be used) for the duration of  the COVID-19 declaration under Section 564(b)(1) of the Act, 21 U.S.C.section 360bbb-3(b)(1), unless the authorization is terminated  or revoked sooner.       Influenza A by PCR NEGATIVE NEGATIVE Final   Influenza B by PCR NEGATIVE NEGATIVE Final    Comment: (NOTE) The Xpert Xpress SARS-CoV-2/FLU/RSV plus assay is intended as an aid in the diagnosis of influenza from Nasopharyngeal swab specimens and should not be used as a sole basis for treatment. Nasal washings and aspirates are unacceptable for Xpert Xpress SARS-CoV-2/FLU/RSV testing.  Fact Sheet for Patients: BloggerCourse.com  Fact Sheet for Healthcare Providers: SeriousBroker.it  This test is not yet approved or cleared by the [place] and  has been authorized for detection and/or diagnosis of SARS-CoV-2 by FDA under an Emergency Use Authorization (EUA). This EUA will remain in effect (meaning this test can be used) for the duration of the COVID-19 declaration under Section 564(b)(1) of the Act, 21 U.S.C. section 360bbb-3(b)(1), unless the authorization is terminated or revoked.     Resp Syncytial Virus by PCR NEGATIVE NEGATIVE Final    Comment: (NOTE) Fact Sheet for Patients: BloggerCourse.com  Fact Sheet for Healthcare Providers: SeriousBroker.it  This test is not yet approved or cleared by the [place] FDA and has been authorized for detection and/or diagnosis of SARS-CoV-2 by FDA under an Emergency Use Authorization (EUA). This EUA will remain in effect (meaning this test can be used) for the duration of the COVID-19 declaration under Section 564(b)(1) of the Act, 21 U.S.C. section 360bbb-3(b)(1), unless the authorization is terminated or revoked.  Performed at [facility], [address] [place], [place] [address]    *Note: Due to a large number of results and/or encounters for the requested  time period, some results have not been displayed. A complete set of results can be found in Results Review.    Coagulation Studies: Recent Labs    08/06/23 0835  LABPROT 17.5*  INR 1.4*    Urinalysis: No results for input(s): "COLORURINE", "LABSPEC", "PHURINE", "GLUCOSEU", "HGBUR", "BILIRUBINUR", "KETONESUR", "PROTEINUR", "UROBILINOGEN", "NITRITE", "LEUKOCYTESUR" in the last 72 hours.  Invalid input(s): "APPERANCEUR"    Imaging: DG FEMUR MIN 2 VIEWS LEFT Result Date: 08/06/2023 CLINICAL DATA:  Intramedullary nail EXAM: Intraoperative fluoroscopy COMPARISON:  X-ray 08/06/2023 earlier FINDINGS: Eight fluoroscopic spot images submitted for review demonstrate presence of the dynamic hip screw with long intramedullary rod and associated distal fixation screws transfixing the intertrochanteric hip fracture. Imaging was obtained to aid in treatment. Please correlate with real-time fluoroscopy of 23.46 mGy and 2 minutes and 9 seconds IMPRESSION: Intraoperative fluoroscopy Electronically Signed   By: Karen Kays M.D.   On: 08/06/2023 18:03   DG C-Arm 1-60 Min-No Report Result Date: 08/06/2023 Fluoroscopy was utilized by the requesting physician.  No radiographic interpretation.     Assessment & Plan: Rachel Huber is a 67 y.o.  female with past medical condition including arthritis, anemia, GERD, hypertension, portal hypertension, cirrhosis, and acute kidney injury stage IIIa. who was admitted to [facility] on 07/28/2023 for Hepatic encephalopathy (HCC) [K76.82]  Acute kidney injury on chronic kidney disease stage III AA.  Baseline creatinine appears to be 1.3 with GFR 46 on 06/30/2023.  Acute kidney injury likely secondary to dehydration from poor oral intake.  Cannot exclude any hypotensive events during surgical procedure.  Agree with IV fluids.  Creatinine 2.02 today.  Continue to avoid nephrotoxic agents and therapies.  2.  Hepatic encephalopathy, history of liver cirrhosis.  Ammonia  currently 463.  Continue lactulose administration per primary team.  3. Anemia of chronic kidney disease Lab Results  Component Value Date   HGB 5.4 (L) 08/08/2023    Hemoglobin currently decreased, 5.4.  Primary team has ordered blood transfusion.  Patient has received previous transfusions during this admission.   LOS: 11 Alichia Alridge 12/21/202412:28 PM

## 2023-08-08 NOTE — Consult Note (Signed)
Inpatient Consultation   Patient ID: Rachel Huber is a 67 y.o. female.  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 The patient is unable to answer any questions or provide history so information is purely garnered from chart review and nursing staff  Patient is followed in a nursing home where she was refusing doses of her lactulose with worsening confusion.  She was sent to the hospital on 12/10 and has remained here since.  Frequently has been refusing encephalopathy treatment. She was confused and tried to go to the bathroom by her self on 12/19 and unfortunately fell and suffered a hip fx requiring surgical repair. Since then, her hgb has descended two grams without signs of gi bleeding. Nursing has not appreciated and upon my evaluation stool is light brown in the bed.  CT head was performed post fall  She has received intermittent enemas and PO lactulose, however, this has been inadequate to address her encephalopathy. Notably, she has required tips revision in the past.   No noted Anti-plt agents, and anticoagulants No documented family history of gastrointestinal disease and malignancy Previous Endoscopies: IR embolization for rectal bleeding of superior rectal artery x 2 (June 2023 and March 2024)   12/2022- Flex Sig- Internal hemorrhoids, brown stool  06/2022- CSY- significant grade 4 IH. Congested rectal mucosa. Normal TI, otherwise normal 01/2022- Flex Sig-  hemorrhoids 11/2020- CSY-  Hemorrhoids, one small TA 09/2020- EGD- normal esophagus, scarring in gastric mucosa, possible PHG, duodenopathy, otherwise unremarkable    Past Medical History:  Diagnosis Date   Anemia in chronic kidney disease (CKD) 02/07/2022   Anxiety    Arthritis    Ascites of liver    Weekly paracentesis   Cirrhosis of liver (HCC)    CKD (chronic kidney disease) stage 4, GFR 15-29 ml/min (HCC)    GERD (gastroesophageal reflux disease)    Grade IV internal hemorrhoids    Hypertension    NASH (nonalcoholic steatohepatitis)    Neuropathy, diabetic (HCC)    Obesity (BMI 30-39.9)    OSA on CPAP    Pneumonia    Portal hypertension (HCC)    S/P TIPS (transjugular intrahepatic portosystemic shunt)    Thalassemia minor 1992    Past Surgical History:  Procedure Laterality Date   ABDOMINAL HYSTERECTOMY     BREAST BIOPSY Right 02/15/2018   [place] bx 6-6:30 ribbon shape, ONE CORE FRAGMENT WITH FIBROSIS. ONE CORE FRAGMENT WITH PORTION OF A DILATED   BREAST BIOPSY Right 02/15/2018   [place] bx 9:00 heart shape, USUAL DUCTAL HYPERPLASIA   BREAST LUMPECTOMY Right 03/09/2018   Procedure: BREAST LUMPECTOMY x 2;  Surgeon: Sung Amabile, DO;  Location: [facility];  Service: General;  Laterality: Right;   CATARACT EXTRACTION W/PHACO Left 06/11/2016   Procedure: CATARACT EXTRACTION PHACO AND INTRAOCULAR LENS PLACEMENT (IOC);  Surgeon: Sallee Lange, MD;  Location: [facility];  Service: Ophthalmology;  Laterality: Left;  Lot # [id number] H US:01:38.6 AP%:26.4 CDE:44.15   CATARACT EXTRACTION W/PHACO Right 06/15/2018   Procedure: CATARACT EXTRACTION PHACO AND INTRAOCULAR LENS PLACEMENT (  IOC);  Surgeon: Galen Manila, MD;  Location: [facility];  Service: Ophthalmology;  Laterality: Right;  [place] 00:38.2 CDE 4.23 Fluid Pack Lot # [id number]   COLONOSCOPIES     COLONOSCOPY WITH PROPOFOL N/A 10/10/2020   Procedure: COLONOSCOPY WITH PROPOFOL;  Surgeon: Regis Bill, MD;  Location: [facility];  Service: Endoscopy;   Laterality: N/A;   COLONOSCOPY WITH PROPOFOL N/A 11/20/2020   Procedure: COLONOSCOPY WITH PROPOFOL;  Surgeon: Regis Bill, MD;  Location: [facility];  Service: Endoscopy;  Laterality: N/A;  DM STAT CBC, BMP COVID POSITIVE 09/02/2020   COLONOSCOPY WITH PROPOFOL N/A 06/19/2022   Procedure: COLONOSCOPY WITH PROPOFOL;  Surgeon: Regis Bill, MD;  Location: [facility];  Service: Endoscopy;  Laterality: N/A;   CTR     ESOPHAGOGASTRODUODENOSCOPY (EGD) WITH PROPOFOL N/A 10/10/2020   Procedure: ESOPHAGOGASTRODUODENOSCOPY (EGD) WITH PROPOFOL;  Surgeon: Regis Bill, MD;  Location: [facility];  Service: Endoscopy;  Laterality: N/A;  COVID POSITIVE 10/08/2020   FLEXIBLE SIGMOIDOSCOPY N/A 02/06/2022   Procedure: FLEXIBLE SIGMOIDOSCOPY;  Surgeon: Regis Bill, MD;  Location: [facility];  Service: Endoscopy;  Laterality: N/A;  Patient requests anesthesia   FLEXIBLE SIGMOIDOSCOPY N/A 12/19/2022   Procedure: FLEXIBLE SIGMOIDOSCOPY;  Surgeon: Dolores Frame, MD;  Location: [facility];  Service: Gastroenterology;  Laterality: N/A;   INTRAMEDULLARY (IM) NAIL INTERTROCHANTERIC Left 08/06/2023   Procedure: INTRAMEDULLARY (IM) NAIL INTERTROCHANTERIC;  Surgeon: Huel Cote, MD;  Location: [facility];  Service: Orthopedics;  Laterality: Left;   IR ANGIOGRAM SELECTIVE EACH ADDITIONAL VESSEL  02/07/2022   IR ANGIOGRAM SELECTIVE EACH ADDITIONAL VESSEL  02/07/2022   IR ANGIOGRAM SELECTIVE EACH ADDITIONAL VESSEL  09/05/2022   IR ANGIOGRAM VISCERAL SELECTIVE  02/07/2022   IR ANGIOGRAM VISCERAL SELECTIVE  09/05/2022   IR CV LINE INJECTION  02/24/2023   IR EMBO ART  VEN HEMORR LYMPH EXTRAV  INC GUIDE ROADMAPPING  09/05/2022   IR EMBO ART  VEN HEMORR LYMPH EXTRAV  INC GUIDE ROADMAPPING  11/05/2022   IR EMBO ARTERIAL NOT HEMORR HEMANG INC GUIDE ROADMAPPING  02/07/2022   IR EMBO TUMOR ORGAN ISCHEMIA INFARCT INC GUIDE ROADMAPPING  01/06/2023   IR IMAGING GUIDED PORT INSERTION   06/13/2022   IR IMAGING GUIDED PORT INSERTION  11/25/2022   IR PARACENTESIS  12/17/2021   IR PARACENTESIS  03/25/2022   IR PARACENTESIS  11/25/2022   IR PARACENTESIS  01/06/2023   IR PARACENTESIS  01/19/2023   IR RADIOLOGIST EVAL & MGMT  03/18/2022   IR RADIOLOGIST EVAL & MGMT  05/07/2022   IR RADIOLOGIST EVAL & MGMT  05/16/2022   IR RADIOLOGIST EVAL & MGMT  10/13/2022   IR RADIOLOGIST EVAL & MGMT  12/12/2022   IR RADIOLOGIST EVAL & MGMT  03/30/2023   IR US GUIDE VASC ACCESS RIGHT  02/07/2022   IR US GUIDE VASC ACCESS RIGHT  09/05/2022   JOINT REPLACEMENT     KNEE SURGERY Right 09/24/2017   plates and pins   PORT-A-CATH REMOVAL N/A 09/23/2022   Procedure: MINOR REMOVAL PORT-A-CATH;  Surgeon: Franky Macho, MD;  Location: [facility];  Service: General;  Laterality: N/A;   TEE WITHOUT CARDIOVERSION N/A 09/23/2022   Procedure: TRANSESOPHAGEAL ECHOCARDIOGRAM (TEE);  Surgeon: Antoine Poche, MD;  Location: [facility];  Service: Endoscopy;  Laterality: N/A;   TOTAL SHOULDER ARTHROPLASTY Right 09/27/2015    Allergies  Allergen Reactions   Latex Rash    Contact rash   Tape Other (See Comments)    Skin sensitivity  Gramineae Pollens Other (See Comments)    Sneezing, Running nose  Other Reaction(s): Other (see comments)    Family History  Problem Relation Age of Onset   Breast cancer Mother 10   Lymphoma Mother    Diabetes Father    Kidney cancer Father    Heart disease Father    Diabetes Sister    Breast cancer Sister 11    Social History   Tobacco Use   Smoking status: Former    Types: Cigarettes   Smokeless tobacco: Never   Tobacco comments:    Smoked "some" in college  Vaping Use   Vaping status: Never Used  Substance Use Topics   Alcohol use: No    Comment: minimal alcohol intake in college   Drug use: Never     Pertinent GI related history and allergies were reviewed with the patient  Review of Systems  Unable to perform ROS: Other (encephalopathy)  Psychiatric/Behavioral:   Positive for confusion.      Medications Home Medications No current facility-administered medications on file prior to encounter.   Current Outpatient Medications on File Prior to Encounter  Medication Sig Dispense Refill   butalbital-acetaminophen-caffeine (FIORICET) 50-325-40 MG tablet Take 1 tablet by mouth every 8 (eight) hours as needed for headache.     colchicine 0.6 MG tablet Take 1 tablet (0.6 mg total) by mouth 2 (two) times daily. 60 tablet 0   cyanocobalamin (VITAMIN B12) 500 MCG tablet Take 1,000 mcg by mouth daily.     ferrous sulfate 325 (65 FE) MG tablet Take 325 mg by mouth daily.     folic acid (FOLVITE) 1 MG tablet Take 1 tablet (1 mg total) by mouth daily. 30 tablet 6   potassium chloride SA (KLOR-CON M) 20 MEQ tablet Take 20 mEq by mouth daily.     sertraline (ZOLOFT) 50 MG tablet Take 1 tablet (50 mg total) by mouth daily. 30 tablet 0   torsemide (DEMADEX) 20 MG tablet Take 2 tablets (40 mg total) by mouth daily. 30 tablet 0   Vitamin D, Ergocalciferol, (DRISDOL) 1.25 MG (50000 UNIT) CAPS capsule Take 1 capsule (50,000 Units total) by mouth once a week. 5 capsule 0   XIFAXAN 550 MG TABS tablet Take 1 tablet (550 mg total) by mouth 2 (two) times daily. 60 tablet 0   Pertinent GI related medications were reviewed with the patient  Inpatient Medications  Current Facility-Administered Medications:    0.9 %  sodium chloride infusion (Manually program via Guardrails IV Fluids), , Intravenous, Once, Sherryll Burger, Vipul, MD   0.9 %  sodium chloride infusion, , Intravenous, Continuous, Marrion Coy, MD, Last Rate: 75 mL/hr at 08/08/23 0927, New Bag at 08/08/23 4034   acetaminophen (TYLENOL) suppository 325 mg, 325 mg, Rectal, Q6H PRN **OR** acetaminophen (TYLENOL) tablet 650 mg, 650 mg, Oral, Q6H PRN, Delfino Lovett, MD   aspirin suppository 300 mg, 300 mg, Rectal, Daily, Sherryll Burger, Vipul, MD, 300 mg at 08/08/23 1016   butalbital-acetaminophen-caffeine (FIORICET) 50-325-40 MG per tablet 1  tablet, 1 tablet, Oral, Q6H PRN, Huel Cote, MD, 1 tablet at 08/07/23 1238   cefTRIAXone (ROCEPHIN) 2 g in sodium chloride 0.9 % 100 mL IVPB, 2 g, Intravenous, Q24H, Marrion Coy, MD   Chlorhexidine Gluconate Cloth 2 % PADS 6 each, 6 each, Topical, Daily, Huel Cote, MD, 6 each at 08/08/23 0948   cyanocobalamin (VITAMIN B12) tablet 1,000 mcg, 1,000 mcg, Oral, Daily, Huel Cote, MD, 1,000 mcg at 08/08/23 1016   folic acid injection  1 mg, 1 mg, Intravenous, Daily, Delfino Lovett, MD, 1 mg at 08/08/23 1016   heparin injection 5,000 Units, 5,000 Units, Subcutaneous, Q8H, Huel Cote, MD, 5,000 Units at 08/08/23 0525   ketorolac (TORADOL) 15 MG/ML injection 15 mg, 15 mg, Intravenous, Q8H PRN, Theotis Burrow, NP   lactulose (CHRONULAC) enema 200 gm, 300 mL, Rectal, QID **OR** lactulose (CHRONULAC) 10 GM/15ML solution 30 g, 30 g, Oral, QID, Chipper Herb, Dekui, MD   pantoprazole (PROTONIX) injection 40 mg, 40 mg, Intravenous, Q12H, Marrion Coy, MD   rifaximin Burman Blacksmith) tablet 550 mg, 550 mg, Oral, BID, Huel Cote, MD, 550 mg at 08/08/23 1016  sodium chloride 75 mL/hr at 08/08/23 0927   cefTRIAXone (ROCEPHIN)  IV      acetaminophen **OR** acetaminophen, butalbital-acetaminophen-caffeine, ketorolac   Objective   Vitals:   08/08/23 0658 08/08/23 0743 08/08/23 0915 08/08/23 0957  BP: (!) 110/49 (!) 115/49 (!) 131/52 (!) 121/48  Pulse: (!) 101 90 89 87  Resp: 12 20 20 20   Temp: 97.6 F (36.4 C) 97.8 F (36.6 C) 98 F (36.7 C) 98.5 F (36.9 C)  TempSrc:  Axillary Oral   SpO2: 100% 100% 100%   Weight:      Height:         Physical Exam Vitals and nursing note reviewed.  Constitutional:      General: She is not in acute distress.    Appearance: She is ill-appearing. She is not diaphoretic.  HENT:     Head: Normocephalic and atraumatic.     Nose: Nose normal.     Mouth/Throat:     Mouth: Mucous membranes are dry.  Eyes:     General: Scleral icterus present.   Cardiovascular:     Rate and Rhythm: Normal rate and regular rhythm.     Heart sounds: Normal heart sounds. No murmur heard.    No friction rub. No gallop.  Pulmonary:     Effort: Pulmonary effort is normal. No respiratory distress.     Breath sounds: Normal breath sounds. No wheezing, rhonchi or rales.  Abdominal:     General: Bowel sounds are normal. There is no distension.     Palpations: Abdomen is soft.     Tenderness: There is no abdominal tenderness. There is no guarding or rebound.  Genitourinary:    Comments: Brown stool in bed. No blood or melena Musculoskeletal:        General: Tenderness (L hip s/p recent surgery) present.     Cervical back: Neck supple.  Skin:    General: Skin is warm and dry.     Coloration: Skin is jaundiced. Skin is not pale.     Comments: Telangiectasias on the face  Neurological:     Comments: Unable to respond to questions aside from yes. She does moan in pain with left leg is touched (recent surgery). Protecting her own airway at this time     Laboratory Data- reviewed by me Recent Labs  Lab 08/06/23 0835 08/07/23 0608 08/08/23 0601  WBC 8.0 13.4* 9.1  HGB 7.5* 6.4* 5.4*  HCT 24.7* 21.1* 18.5*  PLT 164 210 177   Recent Labs  Lab 08/03/23 0230 08/04/23 0430 08/06/23 0835 08/07/23 0608 08/08/23 0601  NA 139   < > 142 142 144  K 3.6   < > 3.5 4.7 4.0  CL 105   < > 106 105 111  CO2 30   < > 21* 25 26  BUN 30*   < > 32*  43* 50*  CALCIUM 9.8   < > 10.6* 9.8 9.6  PROT 6.0*  --  6.2* 6.0*  --   BILITOT 2.9*  --  3.5* 1.9*  --   ALKPHOS 87  --  97 91  --   ALT 21  --  30 19  --   AST 38  --  50* 36  --   GLUCOSE 102*   < > 190* 232* 160*   < > = values in this interval not displayed.   Recent Labs  Lab 08/06/23 0835  INR 1.4*    No results for input(s): "LIPASE" in the last 72 hours.      Imaging Studies: CT head 08/06/23 post fall FINDINGS: Brain: No evidence of acute infarction, hemorrhage,  hydrocephalus, extra-axial collection or extra-axial collection. 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 No edema seen in the adjacent displaced temporal lobe. Or mass lesion/mass effect.   Vascular: No hyperdense vessel or unexpected calcification.   Skull: Normal. Negative for fracture or focal lesion.   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   IMPRESSION: No acute finding.  No evidence of intracranial injury.   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 Unclear of multifactorial - ammonia elevated - recommend assessing for other sources - ct head negative post fall  # Acute on chronic anemia - no signs of gi bleeding. Stool is light brown - pt is s/p tips (required revision x2 in the past) and IR embolization of rectal vessel due to h/o bleed - Hgb began down trending with recent hip surgery - currently on heparin dvt ppx post op - h/o beta thalassemia - base hgb 7-9. Down to 5.4 on 12/21  # AKI on CKD  # acute left intertrochanteric hip fx s/p fall - repair on 12/19  # R medial cranial fossa with meningioma with some mass effect  # obesity  # Previous rectal bleed requiring embolization in the past   Plan:   -Hepatic encephalopathy has been inadequately treated as pt has not received all doses as she has refused in the past and apparently was also refusing at her facility prior to admission - Place NGT and administer lactulose tid-qid. - will likely need rectal lactulose as well if not producing at least 3+ stools. - continue xifaxan 550mg  bid - assess for other sources of encephalopathy as  well -avoid narcotics & sedatives and other hepatotoxic agents - BUN also rising, monitor for signs of uremia which can also contribute to confusion.  - Bun/Cr ratio <30 - pt receiving 1 uprbc from primary team today  - no signs of gib (stools light brown) - agree with protonix for gastric protection - pt s/p tips and plt >150 - no indication for endoscopic evaluation at this time - avoid nsaids as possible - Stool occult blood test has no role in evaluation of anemia and is a test for colon cancer screening and is inappropriate to be used for evaluation of anemia/gastrointestinal bleeding, as a negative or positive test would not change evaluation.  - liver doppler to assess tips as well as abdominal US to assess for ascites for  potential diagnostic paracentesis to r/o sbp - primary team has started rocephin  This has been discussed with her primary team  Management of other medical comorbidities as per primary team  I personally performed the service.  Thank you for allowing [place] to participate in this patient's care. Please don't hesitate to call if any questions or concerns arise.   Jaynie Collins, DO [facility]  Portions of the record may have been created with voice recognition software. Occasional wrong-word or 'sound-a-like' substitutions may have occurred due to the inherent limitations of voice recognition software.  Read the chart carefully and recognize, using context, where substitutions may have occurred.

## 2023-08-08 NOTE — Progress Notes (Signed)
Progress Note   Patient: Rachel Huber GUY:[id number] DOB: [birth year] DOA: 07/28/2023     11 DOS: the patient was seen and examined on 08/08/2023   Brief hospital course: Ms. Tierza Stores is a 67 year old female with history of non-insulin-dependent diabetes mellitus, CKD stage IIIb, Nash liver cirrhosis with refractory ascites status post TIPS creation status post 2 revisions, thalassemia minor, history of rectal bleeding status post superior rectal artery embolization x 2 (June 2023 and March 2024), history of recurrent grade 4 internal hemorrhoids, and 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  Patient was diagnosed with hepatic encephalopathy, was given lactulose and IV fluids.  Patient mental status has been waxing and waning.  Patient had a fall while in the hospital, sustained a new left intertrochanteric hip fracture, had intertrochanteric nail performed on 12/19.   Principal Problem:   Hepatic encephalopathy (HCC) Active Problems:   Chronic kidney disease, stage 3b (HCC)   HLD (hyperlipidemia)   Depression with anxiety   Type 2 diabetes mellitus with hyperlipidemia (HCC)   OSA on CPAP   Anemia in chronic kidney disease (CKD)   Hypokalemia   AMS (altered mental status)   Aortic atherosclerosis (HCC)   Beta thalassemia intermedia (HCC)   Obesity, Class III, BMI 40-49.9 (morbid obesity) (HCC)   Hypomagnesemia   Intertrochanteric fracture of left hip, sequela   Assessment and Plan: Hepatic encephalopathy (HCC) Decompensated NASH cirrhosis with ascites. Suspect secondary to patient refusal to take lactulose at nursing facility Patient has been treated with lactulose, ammonia level seem to be improving.  However, patient has a worsening mental status today, she became unresponsive.  Ammonia level increased to 463 today. Due to altered mental status, patient has not been able to take lactulose  since yesterday evening, I have increased dose of lactulose per rectum. I also continued IV fluids. I will also check a blood cultures, start Rocephin for bacteria peritonitis prophylaxis. Her MRI of the brain in September 2024 showed - 2.2 x 1.5 cm heavily calcified meningioma in the right middle cranial fossa with some mass effect on the lateral aspect of the right temporal tip. MRI brain is unchanged from before Patient long-term prognosis is very poor, palliative care is following.  Anemia in chronic kidney disease (CKD) Patient baseline hemoglobin is around 7-9, hemoglobin dropped down to 6.4 ED yesterday, further dropped down to 5.4 today.  Discussed with the nurse, she has not observed any black stool or rectal bleeding.  Patient's status post TIPS procedure, which she could reduce the risk of GI bleed from esophageal varices.  For now, I will start Protonix IV twice a day just in case patient has GI bleed.  Continue to monitor stools, GI will see the patient today. She will receive 1 unit PRBC now, recheck hemoglobin posttransfusion, she probably needs more units of blood.  Acute on chronic kidney disease, stage 3b (HCC) Mild metabolic acidosis. Hypokalemia. Hypomagnesemia. 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  I will continue IV fluids. Potassium has normalized, recheck magnesium tomorrow.   Acute left intertrochanteric hip fracture Status post fall on 12/19 around 4 AM.  Per report she was trying to get out of the bed by herself to go to the bathroom.   Underwent head CT which was negative for acute findings. Left hip x-ray showed acute fracture -status post left hip cephalomedullary nail in place orthopedics on 12/19 Pain medicine as needed  as ordered Aspirin for DVT prophylaxis Weightbearing as tolerated on the left hip per orthopedic.    Obesity, Class III, BMI 40-49.9 (morbid obesity) (HCC) Obstruct sleep apnea on CPAP. Complicating  factor to overall care and prognosis Continue CPAP while asleep.    Subjective:  Patient become unresponsive since last night, has not been able to take lactulose.   Physical Exam: Vitals:   08/08/23 0658 08/08/23 0743 08/08/23 0915 08/08/23 0957  BP: (!) 110/49 (!) 115/49 (!) 131/52 (!) 121/48  Pulse: (!) 101 90 89 87  Resp: 12 20 20 20   Temp: 97.6 F (36.4 C) 97.8 F (36.6 C) 98 F (36.7 C) 98.5 F (36.9 C)  TempSrc:  Axillary Oral   SpO2: 100% 100% 100%   Weight:      Height:       General exam: Ill-appearing, unresponsive. Respiratory system: Clear to auscultation. Respiratory effort normal. Cardiovascular system: Tachycardic. No JVD, murmurs, rubs, gallops or clicks. No pedal edema. Gastrointestinal system: Abdomen is nondistended, soft and nontender. No organomegaly or masses felt. Normal bowel sounds heard. Central nervous system: Patient is unresponsive to painful stimuli, no focal neurological changes. Extremities: Symmetric 5 x 5 power. Skin: Appears jaundiced.   Data Reviewed:  Review all lab results.  Family Communication: Husband updated over the phone.  Disposition: Status is: Inpatient Remains inpatient appropriate because: Severity of disease.  IV treatment.  Altered mental status.     Time spent: 60 minutes  Author: Marrion Coy, MD 08/08/2023 10:20 AM  For on call review [email or web address].

## 2023-08-08 NOTE — Progress Notes (Signed)
PT Cancellation Note  Patient Details Name: LYSSETTE KIEU MRN: [id number] DOB: 1956-08-10   Cancelled Treatment:     PT evaluation and treatment not complete 2/2 pt's HgB 5.4 today. MD also advised to hold the pt. Will attempt tomorrow if pt is appropriate.    Janet Berlin PT DPT 8:03 AM,08/08/23

## 2023-08-09 DIAGNOSIS — K7682 Hepatic encephalopathy: Secondary | ICD-10-CM | POA: Diagnosis not present

## 2023-08-09 DIAGNOSIS — D561 Beta thalassemia: Secondary | ICD-10-CM | POA: Diagnosis not present

## 2023-08-09 DIAGNOSIS — N1832 Chronic kidney disease, stage 3b: Secondary | ICD-10-CM | POA: Diagnosis not present

## 2023-08-09 DIAGNOSIS — D631 Anemia in chronic kidney disease: Secondary | ICD-10-CM | POA: Diagnosis not present

## 2023-08-09 DIAGNOSIS — Z515 Encounter for palliative care: Secondary | ICD-10-CM | POA: Diagnosis not present

## 2023-08-09 LAB — COMPREHENSIVE METABOLIC PANEL
ALT: 10 U/L (ref 0–44)
AST: 36 U/L (ref 15–41)
Albumin: 2.5 g/dL — ABNORMAL LOW (ref 3.5–5.0)
Alkaline Phosphatase: 89 U/L (ref 38–126)
Anion gap: 8 (ref 5–15)
BUN: 44 mg/dL — ABNORMAL HIGH (ref 8–23)
CO2: 23 mmol/L (ref 22–32)
Calcium: 9.5 mg/dL (ref 8.9–10.3)
Chloride: 114 mmol/L — ABNORMAL HIGH (ref 98–111)
Creatinine, Ser: 1.31 mg/dL — ABNORMAL HIGH (ref 0.44–1.00)
GFR, Estimated: 45 mL/min — ABNORMAL LOW (ref >60)
Glucose, Bld: 167 mg/dL — ABNORMAL HIGH (ref 70–99)
Potassium: 3.2 mmol/L — ABNORMAL LOW (ref 3.5–5.1)
Sodium: 145 mmol/L (ref 135–145)
Total Bilirubin: 2.1 mg/dL — ABNORMAL HIGH (ref <1.2)
Total Protein: 5.2 g/dL — ABNORMAL LOW (ref 6.5–8.1)

## 2023-08-09 LAB — CBC
HCT: 22.6 % — ABNORMAL LOW (ref 36.0–46.0)
Hemoglobin: 7.5 g/dL — ABNORMAL LOW (ref 12.0–15.0)
MCH: 26 pg (ref 26.0–34.0)
MCHC: 33.2 g/dL (ref 30.0–36.0)
MCV: 78.2 fL — ABNORMAL LOW (ref 80.0–100.0)
Platelets: 135 10*3/uL — ABNORMAL LOW (ref 150–400)
RBC: 2.89 MIL/uL — ABNORMAL LOW (ref 3.87–5.11)
RDW: 18.6 % — ABNORMAL HIGH (ref 11.5–15.5)
WBC: 6.2 10*3/uL (ref 4.0–10.5)
nRBC: 0.6 % — ABNORMAL HIGH (ref 0.0–0.2)

## 2023-08-09 LAB — TYPE AND SCREEN
ABO/RH(D): A POS
Antibody Screen: NEGATIVE
Unit division: 0
Unit division: 0

## 2023-08-09 LAB — PHOSPHORUS: Phosphorus: 2 mg/dL — ABNORMAL LOW (ref 2.5–4.6)

## 2023-08-09 LAB — BPAM RBC
Blood Product Expiration Date: 202501152359
Blood Product Expiration Date: 202501192359
ISSUE DATE / TIME: 202412210936
ISSUE DATE / TIME: 202412211651
Unit Type and Rh: 202501192359
Unit Type and Rh: 6200
Unit Type and Rh: 6200

## 2023-08-09 LAB — PROTIME-INR
INR: 2.3 — ABNORMAL HIGH (ref 0.8–1.2)
Prothrombin Time: 25.4 s — ABNORMAL HIGH (ref 11.4–15.2)

## 2023-08-09 LAB — MAGNESIUM: Magnesium: 2.1 mg/dL (ref 1.7–2.4)

## 2023-08-09 MED ORDER — VITAMIN B-12 1000 MCG PO TABS
1000.0000 ug | ORAL_TABLET | Freq: Every day | ORAL | Status: DC
  Administered 2023-08-10 – 2023-08-17 (×8): 1000 ug
  Filled 2023-08-09 (×8): qty 1

## 2023-08-09 MED ORDER — KCL-LACTATED RINGERS 20 MEQ/L IV SOLN: INTRAVENOUS | Status: DC

## 2023-08-09 MED ORDER — POTASSIUM PHOSPHATES 15 MMOLE/5ML IV SOLN
15.0000 mmol | Freq: Once | INTRAVENOUS | Status: AC
  Administered 2023-08-09: 15 mmol via INTRAVENOUS
  Filled 2023-08-09: qty 5

## 2023-08-09 MED ORDER — RIFAXIMIN 550 MG PO TABS
550.0000 mg | ORAL_TABLET | Freq: Two times a day (BID) | ORAL | Status: DC
  Administered 2023-08-09 – 2023-08-17 (×16): 550 mg
  Filled 2023-08-09 (×16): qty 1

## 2023-08-09 MED ORDER — LACTATED RINGERS IV SOLN
INTRAVENOUS | Status: DC
  Filled 2023-08-09 (×2): qty 1000

## 2023-08-09 MED ORDER — LACTULOSE 10 GM/15ML PO SOLN
30.0000 g | Freq: Four times a day (QID) | ORAL | Status: DC
  Administered 2023-08-09 – 2023-08-17 (×30): 30 g
  Filled 2023-08-09 (×30): qty 60

## 2023-08-09 MED ORDER — FOLIC ACID 1 MG PO TABS
1.0000 mg | ORAL_TABLET | Freq: Every day | ORAL | Status: DC
  Administered 2023-08-10 – 2023-08-17 (×8): 1 mg
  Filled 2023-08-09 (×8): qty 1

## 2023-08-09 MED ORDER — LACTULOSE ENEMA
300.0000 mL | Freq: Four times a day (QID) | ORAL | Status: DC
  Filled 2023-08-09 (×35): qty 300

## 2023-08-09 NOTE — Progress Notes (Signed)
Progress Note   Patient: Rachel Huber GLO:[id number] DOB: 03/29/1956 DOA: 07/28/2023     12 DOS: the patient was seen and examined on 08/09/2023   Brief hospital course: 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  Patient was diagnosed with hepatic encephalopathy, was given lactulose and IV fluids.  Patient mental status has been waxing and waning.  Patient had a fall while in the hospital, sustained a new left intertrochanteric hip fracture, had intertrochanteric nail performed on 12/19. 12/21.  Patient became unresponsive, ammonia level went up to more than 400, NG tube was placed, patient was given lactulose.  Received 2 units of PRBC. 12/22.  Patient became more responsive today, still very confused.   Principal Problem:   Hepatic encephalopathy (HCC) Active Problems:   Chronic kidney disease, stage 3b (HCC)   HLD (hyperlipidemia)   Depression with anxiety   Type 2 diabetes mellitus with hyperlipidemia (HCC)   OSA on CPAP   Anemia in chronic kidney disease (CKD)   Hypokalemia   AMS (altered mental status)   Aortic atherosclerosis (HCC)   Beta thalassemia intermedia (HCC)   Obesity, Class III, BMI 40-49.9 (morbid obesity) (HCC)   Hypomagnesemia   Intertrochanteric fracture of left hip, sequela   Assessment and Plan: Hepatic encephalopathy (HCC) Decompensated NASH cirrhosis with ascites. Suspect secondary to patient refusal to take lactulose at nursing facility Patient has been treated with lactulose, ammonia  level seem to be improving.  However, patient has a worsening mental status 12/21, she became unresponsive.  Ammonia level increased to 463. Due to altered mental status, patient has not been able to take lactulose since yesterday evening, GI consult was obtained, lactulose was given after NG tube was placed.  So far blood culture has been negative, mental status seem to be better today, no need for additional prophylactic treatment with Rocephin. Due to altered mental status, not safe for oral intake, I will utilize the NG tube to start tube feeding until mental status improves. Her MRI of the brain in September 2024 showed - 2.2 x 1.5 cm heavily calcified meningioma in the right middle cranial fossa with some mass effect on the lateral aspect of the right temporal tip. MRI brain is unchanged from before Patient long-term prognosis is very poor, palliative care is following.   Anemia in chronic kidney disease (CKD) Patient baseline hemoglobin is around 7-9, hemoglobin dropped down to 6.4 ED yesterday, further dropped down to 5.4 today.  Discussed with the nurse, she has not observed any black stool or rectal bleeding.  Patient's status post TIPS procedure, which she could reduce the risk of GI bleed from esophageal varices.  I briefly put her on IV Protonix, after seen by GI, no concern for GI bleed.  As result, no need for Protonix.  Patient received 2 units of PRBC, hemoglobin went up to 7.5.  Continue to follow.   Acute on chronic kidney disease, stage 3b (HCC) Mild metabolic acidosis. Hypokalemia. Hypomagnesemia. Hypophosphatemia Renal function much improved today, potassium still low, will give potassium phosphate.  Also added potassium into IV fluids.    Acute  left intertrochanteric hip fracture Status post fall on 12/19 around 4 AM.  Per report she was trying to get out of the bed by herself to go to the bathroom.   Underwent head CT which was negative for acute findings. Left hip x-ray  showed acute fracture -status post left hip cephalomedullary nail in place orthopedics on 12/19 Pain medicine as needed as ordered Aspirin for DVT prophylaxis Weightbearing as tolerated on the left hip per orthopedic.     Obesity, Class III, BMI 40-49.9 (morbid obesity) (HCC) Obstruct sleep apnea on CPAP. Complicating factor to overall care and prognosis Continue CPAP while asleep.        Subjective:  Patient is more responsive today, but still confused.  Physical Exam: Vitals:   08/08/23 1855 08/08/23 2106 08/09/23 0527 08/09/23 0747  BP: 134/67 (!) 144/48 (!) 138/55 (!) 127/54  Pulse: 85 79 76 79  Resp: 20 18 18 15   Temp: 98.8 F (37.1 C) 98.1 F (36.7 C) (!) 97.5 F (36.4 C) 98.2 F (36.8 C)  TempSrc: Oral Oral    SpO2: 99% 100% 98% 98%  Weight:      Height:       General exam: Appears calm and comfortable, obese. Respiratory system: Clear to auscultation. Respiratory effort normal. Cardiovascular system: S1 & S2 heard, RRR. No JVD, murmurs, rubs, gallops or clicks. No pedal edema. Gastrointestinal system: Abdomen is nondistended, soft and nontender. No organomegaly or masses felt. Normal bowel sounds heard. Central nervous system: Alert and oriented to to self. Extremities: Symmetric 5 x 5 power. Skin: No rashes, lesions or ulcers Psychiatry: Flat affect.   Data Reviewed:  Lab results reviewed.  Family Communication: None  Disposition: Status is: Inpatient Remains inpatient appropriate because: Severity of disease, IV treatment.     Time spent: 50 minutes  Author: Marrion Coy, MD 08/09/2023 10:25 AM  For on call review [email or web address].

## 2023-08-09 NOTE — Progress Notes (Signed)
Inpatient Follow-up/Progress Note   Patient ID: Rachel Huber is a 67 y.o. female.  Overnight Events / Subjective Findings Patient had NG tube placed yesterday without complication.  She is now receiving lactulose and Xifaxan.  Bowel movements have continued to remain brown/green.  Hemoglobin improved to 7.5 after 1 unit PRBC transfusion.  Patient is arousable, but is not able to provide any meaningful answers. No other acute changes in discussion with nursing  Review of Systems  Unable to perform ROS: Mental status change     Medications  Current Facility-Administered Medications:  .  acetaminophen (TYLENOL) suppository 325 mg, 325 mg, Rectal, Q6H PRN **OR** acetaminophen (TYLENOL) tablet 650 mg, 650 mg, Oral, Q6H PRN, Sherryll Burger, Vipul, MD .  aspirin suppository 300 mg, 300 mg, Rectal, Daily, Sherryll Burger, Vipul, MD, 300 mg at 08/08/23 1016 .  butalbital-acetaminophen-caffeine (FIORICET) 50-325-40 MG per tablet 1 tablet, 1 tablet, Oral, Q6H PRN, Huel Cote, MD, 1 tablet at 08/07/23 1238 .  cefTRIAXone (ROCEPHIN) 2 g in sodium chloride 0.9 % 100 mL IVPB, 2 g, Intravenous, Q24H, Marrion Coy, MD, Last Rate: 200 mL/hr at 08/08/23 1156, 2 g at 08/08/23 1156 .  Chlorhexidine Gluconate Cloth 2 % PADS 6 each, 6 each, Topical, Daily, Huel Cote, MD, 6 each at 08/08/23 [phone] .  cyanocobalamin (VITAMIN B12) tablet 1,000 mcg, 1,000 mcg, Oral, Daily, Huel Cote, MD, 1,000 mcg at 08/08/23 1016 .  folic acid (FOLVITE) tablet 1 mg, 1 mg, Oral, Daily, Tressie Ellis, RPH .  heparin injection 5,000 Units, 5,000 Units, Subcutaneous, Q8H, Huel Cote, MD, 5,000 Units at 08/09/23 0559 .  ketorolac (TORADOL) 15 MG/ML injection 15 mg, 15 mg, Intravenous, Q8H PRN, Theotis Burrow, NP, 15 mg at 08/09/23 0209 .  lactated ringers 1,000 mL with potassium chloride 20 mEq infusion, , Intravenous, Continuous, Chappell, Alex B, RPH .  lactulose (CHRONULAC) enema 200 gm, 300 mL, Rectal, QID **OR** lactulose  (CHRONULAC) 10 GM/15ML solution 30 g, 30 g, Oral, QID, Jaynie Collins, DO, 30 g at 08/08/23 2212 .  potassium PHOSPHATE 15 mmol in dextrose 5 % 250 mL infusion, 15 mmol, Intravenous, Once, Marrion Coy, MD .  rifaximin Burman Blacksmith) tablet 550 mg, 550 mg, Oral, BID, Huel Cote, MD, 550 mg at 08/08/23 2212 . cefTRIAXone (ROCEPHIN)  IV 2 g (08/08/23 1156)  . lactated ringers 1,000 mL with potassium chloride 20 mEq infusion    . potassium PHOSPHATE IVPB (in mmol)      acetaminophen **OR** acetaminophen, butalbital-acetaminophen-caffeine, ketorolac   Objective    Vitals:   08/08/23 1855 08/08/23 2106 08/09/23 0527 08/09/23 0747  BP: 134/67 (!) 144/48 (!) 138/55 (!) 127/54  Pulse: 85 79 76 79  Resp: 20 18 18 15   Temp: 98.8 F (37.1 C) 98.1 F (36.7 C) (!) 97.5 F (36.4 C) 98.2 F (36.8 C)  TempSrc: Oral Oral    SpO2: 99% 100% 98% 98%  Weight:      Height:         Physical Exam Vitals and nursing note reviewed.  Constitutional:      Appearance: She is ill-appearing. She is not toxic-appearing or diaphoretic.  HENT:     Head: Normocephalic and atraumatic.     Nose: Nose normal.     Comments: NGT in place    Mouth/Throat:     Mouth: Mucous membranes are dry.     Pharynx: Oropharynx is clear.  Eyes:     Extraocular Movements: Extraocular movements intact.  Cardiovascular:  Rate and Rhythm: Normal rate and regular rhythm.     Heart sounds: Normal heart sounds. No murmur heard.    No friction rub. No gallop.  Pulmonary:     Effort: Pulmonary effort is normal. No respiratory distress.     Breath sounds: Normal breath sounds.  Abdominal:     General: Bowel sounds are normal. There is no distension.     Palpations: Abdomen is soft.     Tenderness: There is no abdominal tenderness. There is no guarding or rebound.  Genitourinary:    Comments: Rectal tube in place with brown/green stool Musculoskeletal:     Cervical back: Neck supple.  Skin:    General: Skin is  warm and dry.     Coloration: Skin is not jaundiced or pale.  Neurological:     Comments: Arousable to voice. Not able to provide meaningful answers     Laboratory Data Recent Labs  Lab 08/07/23 0608 08/08/23 0601 08/08/23 1459 08/09/23 0358  WBC 13.4* 9.1  --  6.2  HGB 6.4* 5.4* 6.5* 7.5*  HCT 21.1* 18.5*  --  22.6*  PLT 210 177  --  135*   Recent Labs  Lab 08/07/23 0608 08/08/23 0601 08/08/23 1459 08/09/23 0358  NA 142 144  --  145  K 4.7 4.0  --  3.2*  CL 105 111  --  114*  CO2 25 26  --  23  BUN 43* 50*  --  44*  CREATININE 2.24* 2.02*  --  1.31*  CALCIUM 9.8 9.6  --  9.5  PROT 6.0*  --  5.6* 5.2*  BILITOT 1.9*  --  1.9* 2.1*  ALKPHOS 91  --  90 89  ALT 19  --  10 10  AST 36  --  42* 36  GLUCOSE 232* 160*  --  167*   Recent Labs  Lab 08/06/23 0835 08/08/23 1459 08/09/23 0749  INR 1.4* 2.6* 2.3*      Imaging Studies: US LIVER DOPPLER Result Date: 08/08/2023 CLINICAL DATA:  Hepatic cirrhosis with ascites, prior tips placement EXAM: DUPLEX ULTRASOUND OF LIVER AND TIPS SHUNT TECHNIQUE: Color and duplex Doppler ultrasound was performed to evaluate the hepatic in-flow and out-flow vessels. COMPARISON:  None Available. FINDINGS: Portal Vein Velocities Main:  48 cm/sec Right:  149 cm/sec Left:  22 cm/sec TIPS Stent Velocities Proximal:  181 cm/sec Mid:  179 Distal:  144 cm/sec IVC: Patent Hepatic Vein Velocities Right:  41 cm/sec Mid:  21 cm/sec Left:  38 cm/sec Splenic Vein: 30 Superior Mesenteric Vein: 17 Hepatic Artery: 167 centimeters/second Ascites: Small volume ascites. Varices: None visualized. Other findings: Heterogeneous and echogenic hepatic parenchyma consistent with underlying cirrhosis. IMPRESSION: 1. Patent TIPS shunt with velocities as above. 2. Small volume ascites. 3. Cirrhotic morphology of the liver. Electronically Signed   By: Malachy Moan M.D.   On: 08/08/2023 20:13   DG Abd 1 View Result Date: 08/08/2023 CLINICAL DATA:  NG placement.  EXAM: ABDOMEN - 1 VIEW COMPARISON:  Abdominal radiograph dated 01/14/2023. FINDINGS: Enteric tube with tip in the distal stomach. Air is noted within the visualized colon. A TIPS noted in the liver. IMPRESSION: Enteric tube with tip in the distal stomach. Electronically Signed   By: Elgie Collard M.D.   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 Suspect multifactorial - ammonia elevated but improved with adequate treatment - recommend assessing for other sources - ct head negative post fall   # Acute on chronic anemia - no signs of gi bleeding. Stool is light brown - pt is s/p tips (required revision x2 in the past) and IR embolization of rectal vessel due to h/o bleed - Hgb began down trending with recent hip surgery - currently on heparin dvt ppx post op - h/o beta thalassemia - base hgb 7-9. Down to 5.4 on 12/21   # AKI on CKD- improving   # acute left intertrochanteric hip fx s/p fall - repair on 12/19   # R medial cranial fossa with meningioma with some mass effect   # obesity   # Previous rectal bleed requiring embolization in the past  Plan:  Pt remains encephalopathic, but now receiving lactulose and xifaxan regularly via ngt Continue lactulose tid, xifaxan bid Sodium rising with IVF, as is chloride. Consider change in fluids Now that ngt is in place, recommend nutrition consult for tube feeds as pt is not able to take po with her mentation status Continue bid ppi Increase in bilirubin is indirect and likely from blood/hematoma breakdown given recent surgery Continue to evaluate for other sources of encephalopathy as pt is now having bowel movements consistently and improvement has been slow Liver doppler  reviewed and tips is patent; small amt of ascites, not enough for diagnostic paracentesis Hgb appropriately improved with 1 u prbc avoid narcotics & sedatives and other hepatotoxic agents BUN also rising, monitor for signs of uremia which can also contribute to confusion.  Bun/Cr ratio <30  - no signs of gib (stools light brown)  - avoid nsaids as possible - Stool occult blood test has no role in evaluation of anemia and is a test for colon cancer screening and is inappropriate to be used for evaluation of anemia/gastrointestinal bleeding, as a negative or positive test would not change evaluation.  Primary team has pt on abx  Management of other medical comorbidities as per primary team  I personally performed the service.  GI to sign off. Available as needed. Please do not hesitate to call regarding questions or concerns.  Thank you for allowing [place] to participate in this patient's care.  Jaynie Collins, DO [facility]  Portions of the record may have been created with voice recognition software. Occasional wrong-word or 'sound-a-like' substitutions may have occurred due to the inherent limitations of voice recognition software.  Read the chart carefully and recognize, using context, where substitutions may have occurred.

## 2023-08-09 NOTE — Progress Notes (Signed)
OT Cancellation Note  Patient Details Name: Rachel Huber MRN: [id number] DOB: Feb 12, 1956   Cancelled Treatment:    Reason Eval/Treat Not Completed: Patient's level of consciousness. Received re-eval order, reviewed chart. As of this afternoon, pt is very lethargic, disengaged, unresponsive to any directions, unable to speak. She is not alert enough for conducting an OT re-evaluation. Will attempt at a later date, as pt is available and medically appropriate.  Latina Craver 08/09/2023, 3:57 PM

## 2023-08-09 NOTE — Progress Notes (Signed)
[facility]  ROUNDING NOTE   Subjective:   Patient seen sitting up in bed Eyes closed NG tube in place Arousable to voice  Creatinine 1.31  Objective:  Vital signs in last 24 hours:  Temp:  [97.5 F (36.4 C)-98.8 F (37.1 C)] 98.2 F (36.8 C) (12/22 0747) Pulse Rate:  [76-97] 79 (12/22 0747) Resp:  [15-20] 15 (12/22 0747) BP: (127-144)/(46-67) 127/54 (12/22 0747) SpO2:  [98 %-100 %] 98 % (12/22 0747)  Weight change:  Filed Weights   07/28/23 1710  Weight: 135.2 kg    Intake/Output: I/O last 3 completed shifts: In: 703.3 [Blood:703.3] Out: 100 [Urine:100]   Intake/Output this shift:  No intake/output data recorded.  Physical Exam: General: Ill appearing  Head: Normocephalic, atraumatic. NGT  Eyes: Anicteric  Lungs:  Clear to auscultation, normal effort  Heart: Regular rate and rhythm  Abdomen:  Soft, nontender  Extremities:  No peripheral edema.  Neurologic: Aroused to voice  Skin: No lesions       Basic Metabolic Panel: Recent Labs  Lab 08/04/23 0430 08/06/23 0835 08/07/23 0608 08/08/23 0601 08/09/23 0358  NA 142 142 142 144 145  K 3.8 3.5 4.7 4.0 3.2*  CL 106 106 105 111 114*  CO2 28 21* 25 26 23   GLUCOSE 115* 190* 232* 160* 167*  BUN 30* 32* 43* 50* 44*  CREATININE 1.06* 1.23* 2.24* 2.02* 1.31*  CALCIUM 9.9 10.6* 9.8 9.6 9.5  MG  --   --   --   --  2.1  PHOS  --   --   --   --  2.0*    Liver Function Tests: Recent Labs  Lab 08/03/23 0230 08/06/23 0835 08/07/23 0608 08/08/23 1459 08/09/23 0358  AST 38 50* 36 42* 36  ALT 21 30 19 10 10   ALKPHOS 87 97 91 90 89  BILITOT 2.9* 3.5* 1.9* 1.9* 2.1*  PROT 6.0* 6.2* 6.0* 5.6* 5.2*  ALBUMIN 3.0* 2.9* 2.8* 2.5* 2.5*   No results for input(s): "LIPASE", "AMYLASE" in the last 168 hours. Recent Labs  Lab 08/07/23 0608 08/08/23 0601 08/08/23 1459  AMMONIA 81* 463* 48*    CBC: Recent Labs  Lab 08/04/23 0430 08/06/23 0835 08/07/23 0608 08/08/23 0601 08/08/23 1459  08/09/23 0358  WBC 4.5 8.0 13.4* 9.1  --  6.2  HGB 7.9* 7.5* 6.4* 5.4* 6.5* 7.5*  HCT 25.9* 24.7* 21.1* 18.5*  --  22.6*  MCV 74.0* 73.3* 73.8* 75.2*  --  78.2*  PLT 175 164 210 177  --  135*    Cardiac Enzymes: No results for input(s): "CKTOTAL", "CKMB", "CKMBINDEX", "TROPONINI" in the last 168 hours.  BNP: Invalid input(s): "POCBNP"  CBG: Recent Labs  Lab 08/06/23 1404 08/06/23 2241 08/08/23 0658  GLUCAP 198* 250* 162*    Microbiology: Results for orders placed or performed during the hospital encounter of 07/28/23  Culture, blood (Routine X 2) w Reflex to ID Panel     Status: None (Preliminary result)   Collection Time: 08/08/23  2:54 PM   Specimen: BLOOD  Result Value Ref Range Status   Specimen Description BLOOD BLOOD LEFT ARM  Final   Special Requests   Final    BOTTLES DRAWN AEROBIC AND ANAEROBIC Blood Culture adequate volume   Culture   Final    NO GROWTH < 24 HOURS Performed at [facility], [address] [place], [place] [address]    Report Status PENDING  Incomplete  Culture, blood (Routine X 2) w Reflex  to ID Panel     Status: None (Preliminary result)   Collection Time: 08/08/23  2:59 PM   Specimen: BLOOD  Result Value Ref Range Status   Specimen Description BLOOD RIGHT ANTECUBITAL  Final   Special Requests   Final    BOTTLES DRAWN AEROBIC AND ANAEROBIC Blood Culture adequate volume   Culture   Final    NO GROWTH < 24 HOURS Performed at [facility] [address], [place], [place] [address]    Report Status PENDING  Incomplete   *Note: Due to a large number of results and/or encounters for the requested time period, some results have not been displayed. A complete set of results can be found in Results Review.    Coagulation Studies: Recent Labs    08/08/23 1459 08/09/23 0749  LABPROT 27.7* 25.4*  INR 2.6* 2.3*    Urinalysis: No results for input(s): "COLORURINE", "LABSPEC", "PHURINE", "GLUCOSEU", "HGBUR",  "BILIRUBINUR", "KETONESUR", "PROTEINUR", "UROBILINOGEN", "NITRITE", "LEUKOCYTESUR" in the last 72 hours.  Invalid input(s): "APPERANCEUR"    Imaging: US LIVER DOPPLER Result Date: 08/08/2023 CLINICAL DATA:  Hepatic cirrhosis with ascites, prior tips placement EXAM: DUPLEX ULTRASOUND OF LIVER AND TIPS SHUNT TECHNIQUE: Color and duplex Doppler ultrasound was performed to evaluate the hepatic in-flow and out-flow vessels. COMPARISON:  None Available. FINDINGS: Portal Vein Velocities Main:  48 cm/sec Right:  149 cm/sec Left:  22 cm/sec TIPS Stent Velocities Proximal:  181 cm/sec Mid:  179 Distal:  144 cm/sec IVC: Patent Hepatic Vein Velocities Right:  41 cm/sec Mid:  21 cm/sec Left:  38 cm/sec Splenic Vein: 30 Superior Mesenteric Vein: 17 Hepatic Artery: 167 centimeters/second Ascites: Small volume ascites. Varices: None visualized. Other findings: Heterogeneous and echogenic hepatic parenchyma consistent with underlying cirrhosis. IMPRESSION: 1. Patent TIPS shunt with velocities as above. 2. Small volume ascites. 3. Cirrhotic morphology of the liver. Electronically Signed   By: Malachy Moan M.D.   On: 08/08/2023 20:13   DG Abd 1 View Result Date: 08/08/2023 CLINICAL DATA:  NG placement. EXAM: ABDOMEN - 1 VIEW COMPARISON:  Abdominal radiograph dated 01/14/2023. FINDINGS: Enteric tube with tip in the distal stomach. Air is noted within the visualized colon. A TIPS noted in the liver. IMPRESSION: Enteric tube with tip in the distal stomach. Electronically Signed   By: Elgie Collard M.D.   On: 08/08/2023 15:42     Medications:    lactated ringers 1,000 mL with potassium chloride 20 mEq infusion 75 mL/hr at 08/09/23 1044   potassium PHOSPHATE IVPB (in mmol) 15 mmol (08/09/23 1056)    Chlorhexidine Gluconate Cloth  6 each Topical Daily   [START ON 08/10/2023] cyanocobalamin  1,000 mcg Per Tube Daily   [START ON 08/10/2023] folic acid  1 mg Per Tube Daily   heparin  5,000 Units Subcutaneous Q8H    lactulose  300 mL Rectal QID   Or   lactulose  30 g Per Tube QID   rifaximin  550 mg Per Tube BID   acetaminophen **OR** acetaminophen, butalbital-acetaminophen-caffeine, ketorolac  Assessment/ Plan:  [name] is a 67 y.o.  female with past medical condition including arthritis, anemia, GERD, hypertension, portal hypertension, cirrhosis, and acute kidney injury stage IIIa. who was admitted to [facility] on 07/28/2023 for Hepatic encephalopathy (HCC) [K76.82]   Acute kidney injury on chronic kidney disease stage III AA. Baseline creatinine appears to be 1.3 with GFR 46 on 06/30/2023. Acute kidney injury likely secondary to dehydration from poor oral intake. Cannot exclude any hypotensive  events during surgical procedure.  Creatinine responding well to IVF. Has returned to baseline. Continue IVF for now.   Lab Results  Component Value Date   CREATININE 1.31 (H) 08/09/2023   CREATININE 2.02 (H) 08/08/2023   CREATININE 2.24 (H) 08/07/2023    Intake/Output Summary (Last 24 hours) at 08/09/2023 1124 Last data filed at 08/09/2023 0145 Gross per 24 hour  Intake 703.33 ml  Output 100 ml  Net 603.33 ml   2.  Hepatic encephalopathy, history of liver cirrhosis.  Ammonia currently 463.  Continue lactulose administration per primary team.   3. Anemia of chronic kidney disease Lab Results  Component Value Date   HGB 7.5 (L) 08/09/2023    Hgb remains decreased but improved from yesterday. Patient did receive 1 unit blood transfusion yesterday.     LOS: 12 Rachel Huber 12/22/202411:24 AM

## 2023-08-09 NOTE — Progress Notes (Signed)
PT Cancellation Note  Patient Details Name: Rachel Huber MRN: [id number] DOB: 31-Aug-1955   Cancelled Treatment:    Reason Eval/Treat Not Completed: Fatigue/lethargy limiting ability to participate;Patient's level of consciousness (Author returns for 2nd attempt to evaluate, sister in room. Pt remains lethargic  appears asleep for most of my >10 minutes in room, but does gesture/ask for water a couple times which sister provides.) Pt remains altered unable to follow commands, would have difficulty relaying pain response for LLE mobility. Author elevates left heel off mattress, RLE moves freely on occasion. Will continue to follow and proceed with evaluation once pt is able to participate. Spoke to pt's sister at length while in room to explain what PT services would look like in the setting of current medical issues.     Menelik Mcfarren C 08/09/2023, 2:21 PM

## 2023-08-09 NOTE — Progress Notes (Signed)
Daily Progress Note   Patient Name: Rachel Huber       Date: 08/09/2023 DOB: [birth year]  Age: 67 y.o. 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    12/12: Patient has been excepting her lactulose and having multiple bowel movements.  Mental status remains poor 12/13: No clinical status changes.  Patient is a little bit more awake.  Has been taking her p.o. lactulose. 12/15: Mental status seems close to baseline.  Waiting for PT and OT eval 12/16: PT and OT recommends SNF.  Husband refusing to take her back to [facility].  TOC working on alternative placement 12/17: Patient refusing lactulose at times.  Ammonia trending up 12/18: Lactulose enema was given as an option yesterday for oral lactulose when she is not able to take.  Ammonia 94 12/19: Patient reportedly fell overnight/early morning and has new left intertrochanteric hip fracture.  Going for surgery/OR today 12/20 s/p left hip cephalmomedullary nail 12/22 GI has been consulted-recommendations for NGT placement to ensure administration of lactulose, liver US assessing  for ascites for potential paracentesis to assess for SBP--recommendations to assess for other sources of encephalopthy  Palliative Medicine consulted for assisting with goals of care conversations.  Subjective: Extensive chart review has been completed prior to meeting patient including labs, vital signs, imaging, progress notes, orders, and available advanced directive documents from current and previous encounters.    Visited with Rachel Huber at her bedside. She is resting in bed, responds with eye opening to calling of her name, attempts to communicate-disoriented and easily drifts back off to sleep without redirection. NGT and FMS in place. No family at bedside during time of visit.  Returned to bedside when husband, daughter and sister visiting. Husband able to share his understanding of current medical condition. Rachel Huber  shares Rachel Huber has been through this in the past and has been able to fully recover which he remains hopeful for.  Answered and addressed all questions and concerns. PMT to continue to follow intermittently addressing needs and concerns and continuing goals of care conversations.  Thank you for allowing the Palliative Medicine Team to assist in the care of this patient.  Total time:  25 minutes  Time spent includes: Detailed review of medical records (labs, imaging, vital signs), medically appropriate exam (mental status, respiratory, cardiac, skin), discussed with treatment team, counseling and educating patient, family and staff, documenting clinical information, medication management and coordination of care.  Rachel Deed, DNP, AGNP-C Palliative Medicine   Please contact Palliative Medicine Team phone at [phone] for questions and concerns.

## 2023-08-09 NOTE — Progress Notes (Signed)
PT Cancellation Note  Patient Details Name: Rachel Huber MRN: [id number] DOB: 1956-02-16   Cancelled Treatment:    Reason Eval/Treat Not Completed: Patient's level of consciousness;Fatigue/lethargy limiting ability to participate (Pt somnolent this morning, does not response to questioning. Pt moans when author repositions leg for prophylaxis. RN reports disoriented and altered. Will attempt again later in day.)   Lauri Till C 08/09/2023, 9:15 AM

## 2023-08-09 NOTE — Plan of Care (Signed)
  Problem: Education: Goal: Knowledge of General Education information will improve Description: Including pain rating scale, medication(s)/side effects and non-pharmacologic comfort measures Outcome: Not Progressing   Problem: Health Behavior/Discharge Planning: Goal: Ability to manage health-related needs will improve Outcome: Not Progressing   Problem: Clinical Measurements: Goal: Ability to maintain clinical measurements within normal limits will improve Outcome: Not Progressing Goal: Will remain free from infection Outcome: Not Progressing Goal: Diagnostic test results will improve Outcome: Not Progressing Goal: Respiratory complications will improve Outcome: Not Progressing Goal: Cardiovascular complication will be avoided Outcome: Not Progressing   Problem: Activity: Goal: Risk for activity intolerance will decrease Outcome: Not Progressing   Problem: Nutrition: Goal: Adequate nutrition will be maintained Outcome: Not Progressing   Problem: Coping: Goal: Level of anxiety will decrease Outcome: Not Progressing   Problem: Elimination: Goal: Will not experience complications related to bowel motility Outcome: Progressing Goal: Will not experience complications related to urinary retention Outcome: Progressing   Problem: Pain Management: Goal: General experience of comfort will improve Outcome: Progressing   Problem: Safety: Goal: Ability to remain free from injury will improve Outcome: Not Progressing   Problem: Skin Integrity: Goal: Risk for impaired skin integrity will decrease Outcome: Not Progressing

## 2023-08-10 DIAGNOSIS — Z7189 Other specified counseling: Secondary | ICD-10-CM

## 2023-08-10 DIAGNOSIS — R4182 Altered mental status, unspecified: Secondary | ICD-10-CM | POA: Diagnosis not present

## 2023-08-10 DIAGNOSIS — Z515 Encounter for palliative care: Secondary | ICD-10-CM | POA: Diagnosis not present

## 2023-08-10 DIAGNOSIS — E876 Hypokalemia: Secondary | ICD-10-CM

## 2023-08-10 DIAGNOSIS — F418 Other specified anxiety disorders: Secondary | ICD-10-CM | POA: Diagnosis not present

## 2023-08-10 DIAGNOSIS — N1832 Chronic kidney disease, stage 3b: Secondary | ICD-10-CM | POA: Diagnosis not present

## 2023-08-10 DIAGNOSIS — K7682 Hepatic encephalopathy: Secondary | ICD-10-CM | POA: Diagnosis not present

## 2023-08-10 DIAGNOSIS — G4733 Obstructive sleep apnea (adult) (pediatric): Secondary | ICD-10-CM

## 2023-08-10 LAB — CBC
HCT: 23.5 % — ABNORMAL LOW (ref 36.0–46.0)
Hemoglobin: 7.3 g/dL — ABNORMAL LOW (ref 12.0–15.0)
MCH: 25 pg — ABNORMAL LOW (ref 26.0–34.0)
MCHC: 31.1 g/dL (ref 30.0–36.0)
MCV: 80.5 fL (ref 80.0–100.0)
Platelets: 143 10*3/uL — ABNORMAL LOW (ref 150–400)
RBC: 2.92 MIL/uL — ABNORMAL LOW (ref 3.87–5.11)
RDW: 19.8 % — ABNORMAL HIGH (ref 11.5–15.5)
WBC: 6.2 10*3/uL (ref 4.0–10.5)
nRBC: 0.3 % — ABNORMAL HIGH (ref 0.0–0.2)

## 2023-08-10 LAB — COMPREHENSIVE METABOLIC PANEL
ALT: 9 U/L (ref 0–44)
AST: 32 U/L (ref 15–41)
Albumin: 2.5 g/dL — ABNORMAL LOW (ref 3.5–5.0)
Alkaline Phosphatase: 94 U/L (ref 38–126)
Anion gap: 7 (ref 5–15)
BUN: 39 mg/dL — ABNORMAL HIGH (ref 8–23)
CO2: 27 mmol/L (ref 22–32)
Calcium: 9.6 mg/dL (ref 8.9–10.3)
Chloride: 118 mmol/L — ABNORMAL HIGH (ref 98–111)
Creatinine, Ser: 1.01 mg/dL — ABNORMAL HIGH (ref 0.44–1.00)
GFR, Estimated: >60 mL/min (ref >60)
Glucose, Bld: 115 mg/dL — ABNORMAL HIGH (ref 70–99)
Potassium: 3.7 mmol/L (ref 3.5–5.1)
Sodium: 152 mmol/L — ABNORMAL HIGH (ref 135–145)
Total Bilirubin: 1.7 mg/dL — ABNORMAL HIGH (ref <1.2)
Total Protein: 5.2 g/dL — ABNORMAL LOW (ref 6.5–8.1)

## 2023-08-10 LAB — IRON AND TIBC: Iron: 63 ug/dL (ref 28–170)

## 2023-08-10 LAB — GLUCOSE, CAPILLARY: Glucose-Capillary: 229 mg/dL — ABNORMAL HIGH (ref 70–99)

## 2023-08-10 LAB — MAGNESIUM
Magnesium: 2 mg/dL (ref 1.7–2.4)
Magnesium: 1.9 mg/dL (ref 1.7–2.4)
Magnesium: 2.1 mg/dL (ref 1.7–2.4)

## 2023-08-10 LAB — FOLATE: Folate: 20.2 ng/mL (ref >5.9)

## 2023-08-10 LAB — RETICULOCYTES
Immature Retic Fract: 25.2 % — ABNORMAL HIGH (ref 2.3–15.9)
RBC.: 3.02 MIL/uL — ABNORMAL LOW (ref 3.87–5.11)
Retic Count, Absolute: 180.3 10*3/uL (ref 19.0–186.0)
Retic Ct Pct: 6 % — ABNORMAL HIGH (ref 0.4–3.1)

## 2023-08-10 LAB — PHOSPHORUS
Phosphorus: 3.3 mg/dL (ref 2.5–4.6)
Phosphorus: 3 mg/dL (ref 2.5–4.6)
Phosphorus: 2.4 mg/dL — ABNORMAL LOW (ref 2.5–4.6)

## 2023-08-10 LAB — VITAMIN B12: Vitamin B-12: 1527 pg/mL — ABNORMAL HIGH (ref 180–914)

## 2023-08-10 LAB — FERRITIN: Ferritin: 649 ng/mL — ABNORMAL HIGH (ref 11–307)

## 2023-08-10 MED ORDER — THIAMINE MONONITRATE 100 MG PO TABS
100.0000 mg | ORAL_TABLET | Freq: Every day | ORAL | Status: AC
  Administered 2023-08-10 – 2023-08-16 (×7): 100 mg
  Filled 2023-08-10 (×7): qty 1

## 2023-08-10 MED ORDER — FREE WATER
30.0000 mL | Status: DC
  Administered 2023-08-10 – 2023-08-11 (×7): 30 mL

## 2023-08-10 MED ORDER — ORAL CARE MOUTH RINSE: 15.0000 mL | OROMUCOSAL | Status: DC | PRN

## 2023-08-10 MED ORDER — DEXTROSE 5 % IV SOLN: INTRAVENOUS | Status: DC

## 2023-08-10 MED ORDER — ORAL CARE MOUTH RINSE
15.0000 mL | OROMUCOSAL | Status: DC | PRN
  Administered 2023-08-14: 15 mL via OROMUCOSAL

## 2023-08-10 MED ORDER — PIVOT 1.5 CAL PO LIQD: 1000.0000 mL | ORAL | Status: DC

## 2023-08-10 MED ORDER — ADULT MULTIVITAMIN W/MINERALS CH
1.0000 | ORAL_TABLET | Freq: Every day | ORAL | Status: DC
  Administered 2023-08-10 – 2023-08-17 (×8): 1
  Filled 2023-08-10 (×8): qty 1

## 2023-08-10 MED ORDER — PROSOURCE TF20 ENFIT COMPATIBL EN LIQD
60.0000 mL | Freq: Two times a day (BID) | ENTERAL | Status: DC
  Administered 2023-08-10 – 2023-08-17 (×15): 60 mL
  Filled 2023-08-10 (×2): qty 60

## 2023-08-10 MED ORDER — OSMOLITE 1.5 CAL PO LIQD
1000.0000 mL | ORAL | Status: DC
  Administered 2023-08-10 – 2023-08-13 (×4): 1000 mL

## 2023-08-10 NOTE — Progress Notes (Signed)
[facility]  ROUNDING NOTE   Subjective:   Patient laying in bed Eyes open to voice, no other response NGT clamped IVF infusing  Creatinine 1.01 Sodium 152  Objective:  Vital signs in last 24 hours:  Temp:  [98.4 F (36.9 C)] 98.4 F (36.9 C) (12/23 0434) Pulse Rate:  [72-97] 89 (12/23 0816) Resp:  [16-19] 16 (12/23 0816) BP: (113-144)/(51-61) 113/52 (12/23 0816) SpO2:  [98 %-100 %] 99 % (12/23 0816)  Weight change:  Filed Weights   07/28/23 1710  Weight: 135.2 kg    Intake/Output: I/O last 3 completed shifts: In: 1670 [I.V.:1056.3; NG/GT:60; IV Piggyback:553.7] Out: 1000 [Urine:100; Stool:900]   Intake/Output this shift:  No intake/output data recorded.  Physical Exam: General: Ill appearing  Head: Normocephalic, atraumatic. NGT  Eyes: Anicteric  Lungs:  Clear to auscultation, normal effort  Heart: Regular rate and rhythm  Abdomen:  Soft, nontender  Extremities:  No peripheral edema.  Neurologic: Aroused to voice  Skin: No lesions       Basic Metabolic Panel: Recent Labs  Lab 08/06/23 0835 08/07/23 0608 08/08/23 0601 08/09/23 0358 08/10/23 0500  NA 142 142 144 145 152*  K 3.5 4.7 4.0 3.2* 3.7  CL 106 105 111 114* 118*  CO2 21* 25 26 23 27   GLUCOSE 190* 232* 160* 167* 115*  BUN 32* 43* 50* 44* 39*  CREATININE 1.23* 2.24* 2.02* 1.31* 1.01*  CALCIUM 10.6* 9.8 9.6 9.5 9.6  MG  --   --   --  2.1 2.0  PHOS  --   --   --  2.0* 3.3    Liver Function Tests: Recent Labs  Lab 08/06/23 0835 08/07/23 0608 08/08/23 1459 08/09/23 0358 08/10/23 0500  AST 50* 36 42* 36 32  ALT 30 19 10 10 9   ALKPHOS 97 91 90 89 94  BILITOT 3.5* 1.9* 1.9* 2.1* 1.7*  PROT 6.2* 6.0* 5.6* 5.2* 5.2*  ALBUMIN 2.9* 2.8* 2.5* 2.5* 2.5*   No results for input(s): "LIPASE", "AMYLASE" in the last 168 hours. Recent Labs  Lab 08/07/23 0608 08/08/23 0601 08/08/23 1459  AMMONIA 81* 463* 48*    CBC: Recent Labs  Lab 08/06/23 0835 08/07/23 0608  08/08/23 0601 08/08/23 1459 08/09/23 0358 08/10/23 0500  WBC 8.0 13.4* 9.1  --  6.2 6.2  HGB 7.5* 6.4* 5.4* 6.5* 7.5* 7.3*  HCT 24.7* 21.1* 18.5*  --  22.6* 23.5*  MCV 73.3* 73.8* 75.2*  --  78.2* 80.5  PLT 164 210 177  --  135* 143*    Cardiac Enzymes: No results for input(s): "CKTOTAL", "CKMB", "CKMBINDEX", "TROPONINI" in the last 168 hours.  BNP: Invalid input(s): "POCBNP"  CBG: Recent Labs  Lab 08/06/23 1404 08/06/23 2241 08/08/23 0658  GLUCAP 198* 250* 162*    Microbiology: Results for orders placed or performed during the hospital encounter of 07/28/23  Culture, blood (Routine X 2) w Reflex to ID Panel     Status: None (Preliminary result)   Collection Time: 08/08/23  2:54 PM   Specimen: BLOOD  Result Value Ref Range Status   Specimen Description BLOOD BLOOD LEFT ARM  Final   Special Requests   Final    BOTTLES DRAWN AEROBIC AND ANAEROBIC Blood Culture adequate volume   Culture   Final    NO GROWTH 2 DAYS Performed at [facility], [address] [place], [place] [address]    Report Status PENDING  Incomplete  Culture, blood (Routine X 2) w Reflex to ID  Panel     Status: None (Preliminary result)   Collection Time: 08/08/23  2:59 PM   Specimen: BLOOD  Result Value Ref Range Status   Specimen Description BLOOD RIGHT ANTECUBITAL  Final   Special Requests   Final    BOTTLES DRAWN AEROBIC AND ANAEROBIC Blood Culture adequate volume   Culture   Final    NO GROWTH 2 DAYS Performed at [facility], [address] [place], [place] [address]    Report Status PENDING  Incomplete   *Note: Due to a large number of results and/or encounters for the requested time period, some results have not been displayed. A complete set of results can be found in Results Review.    Coagulation Studies: Recent Labs    08/08/23 1459 08/09/23 0749  LABPROT 27.7* 25.4*  INR 2.6* 2.3*    Urinalysis: No results for input(s): "COLORURINE", "LABSPEC",  "PHURINE", "GLUCOSEU", "HGBUR", "BILIRUBINUR", "KETONESUR", "PROTEINUR", "UROBILINOGEN", "NITRITE", "LEUKOCYTESUR" in the last 72 hours.  Invalid input(s): "APPERANCEUR"    Imaging: US LIVER DOPPLER Result Date: 08/08/2023 CLINICAL DATA:  Hepatic cirrhosis with ascites, prior tips placement EXAM: DUPLEX ULTRASOUND OF LIVER AND TIPS SHUNT TECHNIQUE: Color and duplex Doppler ultrasound was performed to evaluate the hepatic in-flow and out-flow vessels. COMPARISON:  None Available. FINDINGS: Portal Vein Velocities Main:  48 cm/sec Right:  149 cm/sec Left:  22 cm/sec TIPS Stent Velocities Proximal:  181 cm/sec Mid:  179 Distal:  144 cm/sec IVC: Patent Hepatic Vein Velocities Right:  41 cm/sec Mid:  21 cm/sec Left:  38 cm/sec Splenic Vein: 30 Superior Mesenteric Vein: 17 Hepatic Artery: 167 centimeters/second Ascites: Small volume ascites. Varices: None visualized. Other findings: Heterogeneous and echogenic hepatic parenchyma consistent with underlying cirrhosis. IMPRESSION: 1. Patent TIPS shunt with velocities as above. 2. Small volume ascites. 3. Cirrhotic morphology of the liver. Electronically Signed   By: Malachy Moan M.D.   On: 08/08/2023 20:13   DG Abd 1 View Result Date: 08/08/2023 CLINICAL DATA:  NG placement. EXAM: ABDOMEN - 1 VIEW COMPARISON:  Abdominal radiograph dated 01/14/2023. FINDINGS: Enteric tube with tip in the distal stomach. Air is noted within the visualized colon. A TIPS noted in the liver. IMPRESSION: Enteric tube with tip in the distal stomach. Electronically Signed   By: Elgie Collard M.D.   On: 08/08/2023 15:42     Medications:    dextrose 50 mL/hr at 08/10/23 6045    Chlorhexidine Gluconate Cloth  6 each Topical Daily   cyanocobalamin  1,000 mcg Per Tube Daily   folic acid  1 mg Per Tube Daily   heparin  5,000 Units Subcutaneous Q8H   lactulose  300 mL Rectal QID   Or   lactulose  30 g Per Tube QID   rifaximin  550 mg Per Tube BID   acetaminophen **OR**  acetaminophen, butalbital-acetaminophen-caffeine, ketorolac, mouth rinse  Assessment/ Plan:  Ms. BRIXTON GALLIPEAU is a 67 y.o.  female with past medical condition including arthritis, anemia, GERD, hypertension, portal hypertension, cirrhosis, and acute kidney injury stage IIIa. who was admitted to [facility] on 07/28/2023 for Hepatic encephalopathy (HCC) [K76.82]   Acute kidney injury on chronic kidney disease stage III AA. Baseline creatinine appears to be 1.3 with GFR 46 on 06/30/2023. Acute kidney injury likely secondary to dehydration from poor oral intake. Cannot exclude any hypotensive events during surgical procedure.  Creatinine has returned to baseline. Continue IV hydration  Lab Results  Component Value Date   CREATININE 1.01 (H) 08/10/2023  CREATININE 1.31 (H) 08/09/2023   CREATININE 2.02 (H) 08/08/2023    Intake/Output Summary (Last 24 hours) at 08/10/2023 0954 Last data filed at 08/10/2023 0500 Gross per 24 hour  Intake 1609.95 ml  Output 900 ml  Net 709.95 ml   2.  Hepatic encephalopathy, history of liver cirrhosis.  Ammonia currently 463.  Continue lactulose administration per primary team.   3. Anemia of chronic kidney disease Lab Results  Component Value Date   HGB 7.3 (L) 08/10/2023    Hgb remains decreased but improved from yesterday. Will continue to monitor. Patient has received blood transfusions during this admission.   4. Hypernatremia, sodium 152. Likely due to Gi losses via NGT. Will change her IVF to D5 at 50 ml/hr.   LOS: 13 Watson Robarge 12/23/20249:54 AM

## 2023-08-10 NOTE — Progress Notes (Signed)
Initial Nutrition Assessment  DOCUMENTATION CODES:   Not applicable  INTERVENTION:   -TF via NGT:   Initiate Osmolite 1.5 @ 20 ml/hr and increase by 10 ml every 8 hours to goal rate of 50 ml/hr.   60 ml Prosource TF BID.    30 ml free water flush every 4 hours  If no IVFs, 150 ml free water flush every 4 hours  Tube feeding regimen provides 1960 kcal (100% of needs), 115 grams of protein, and 1094 ml of H2O.  Total free water: 1994 ml daily  -MVI with minerals daily via tube -100 mg thiamine daily x 7 days via tube -Monitor Mg, K, and Phos and replete as needed secondary to high refeeding risk -Case discussed with MD; consult to pharmacy for electrolyte management and repletion ordered  NUTRITION DIAGNOSIS:   Increased nutrient needs related to acute illness (hepatic encepahlopathy) as evidenced by estimated needs.  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  Pt admitted with hepatic encephalopathy.   12/19- s/p fall, s/p Procedure(s): INTRAMEDULLARY (IM) NAIL INTERTROCHANTERIC (Left) 12/21- rectal tube in place, NGT placed (per KUB on 08/08/23- tip of tube in distal stomach)  Reviewed I/O's: +770 ml x 24 hours and +3.6 L since admission  Rectal tube output: 900 ml x 245 hours  Per H&P, pt did not take  lactulose at SNF, which MD suspected is cause of hepatic encephalopathy. Pt originally from [facility], but [facility] pursuing other placement options.   Pt has been struggling with poor oral intake and lethargic this admission. Noted she has been refusing lactulose this admission and it has been difficult for her to take PO's and medications over the past several days.   Pt sitting up in bed at time of visit. She was unable to awaken when touched or when name was called. No family present to provide additional history. Per MD, plan to start TF today. RD will start continuous feeds secondary to high refeeding risk (minimal nutrition for at least 7 days) and will advance feeds slowly in addition to monitoring electrolytes closely. Pt is a poor candidate for bolus feeding with NGT in place secondary to high refeeding risk and prolonged amount of time it would take to administer bolus feedings through NGT. Case discussed with MD; requested pharmacy consult for electrolyte management.   Reviewed wt hx; no wt loss noted over the past 3 months. Pt with mild edema on exam, which may be masking true weight loss as well as fat and muscle depletions.   Palliative care following for goals of care discussions.   Medications reviewed and include vitamin B-12, folic acid, lactulose, xifaxin, and dextrose 5% @ 50 ml/hr.   Labs reviewed: Na: 152, CBGS: 162 (inpatient orders for glycemic control are none).    NUTRITION - FOCUSED PHYSICAL EXAM:  Flowsheet Row Most Recent Value  Orbital Region No depletion  Upper Arm  Region No depletion  Thoracic and Lumbar Region No depletion  Buccal Region No depletion  Temple Region Mild depletion  Clavicle Bone Region Mild depletion  Clavicle and Acromion Bone Region No depletion  Scapular Bone Region No depletion  Dorsal Hand No depletion  Patellar Region Mild depletion  Anterior Thigh Region Mild depletion  Posterior Calf Region Mild depletion  Edema (RD  Assessment) Mild  Hair Reviewed  Eyes Reviewed  Mouth Reviewed  Skin Reviewed  Nails Reviewed       Diet Order:   Diet Order             Diet NPO time specified  Diet effective midnight                   EDUCATION NEEDS:   Not appropriate for education at this time  Skin:  Skin Assessment: Skin Integrity Issues: Skin Integrity Issues:: Stage I, Incisions Stage I: sacrum Incisions: closed lt hip  Last BM:  08/10/23 (900 ml via rectal tube)  Height:   Ht Readings from Last 1 Encounters:  07/29/23 5\' 9"  (1.753 m)    Weight:   Wt Readings from Last 1 Encounters:  07/28/23 135.2 kg    Ideal Body Weight:  65.9 kg  BMI:  Body mass index is 44.01 kg/m.  Estimated Nutritional Needs:   Kcal:  1950-2150  Protein:  100-115 grams  Fluid:  > 1.9 L    Levada Schilling, RD, LDN, CDCES Registered Dietitian III Certified Diabetes Care and Education Specialist If unable to reach this RD, please use "RD Inpatient" group chat on secure chat between hours of 8am-4 pm daily

## 2023-08-10 NOTE — Progress Notes (Signed)
Daily Progress Note   Patient Name: Rachel Huber       Date: 08/10/2023 DOB: 29-Jul-1956  Age: 67 y.o. 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    12/12: Patient has been excepting her lactulose and having multiple bowel movements.  Mental status remains poor 12/13: No clinical status changes.  Patient is a little bit more awake.  Has been taking her p.o. lactulose. 12/15: Mental status seems close to baseline.  Waiting for PT and OT eval 12/16: PT and OT recommends SNF.  Husband refusing to take her back to [facility].  TOC working on alternative placement 12/17: Patient refusing lactulose at times.  Ammonia trending up 12/18: Lactulose enema was given as an option yesterday for oral lactulose when she is not able to take.  Ammonia 94 12/19: Patient reportedly fell overnight/early morning and has new left intertrochanteric hip fracture.  Going for surgery/OR today 12/20 s/p left hip cephalmomedullary nail 12/22 GI has been consulted-recommendations for NGT placement to ensure administration of lactulose, liver US assessing for ascites for potential paracentesis to assess for SBP--recommendations to assess for other sources of encephalopthy   Palliative Medicine consulted for assisting with goals of care  conversations.  Subjective:   Subjective: Chart Reviewed. Updates received. Patient Assessed. Created space and opportunity for patient  and family to explore thoughts and feelings regarding current medical situation.  Today's Discussion: Today I spoke with the nurse before entering the room.  The patient has been quite somnolent today, remains nearly obtunded although she does open her eyes to voice at times.  They are hopeful for improvement with NG tube placement and receiving lactulose and Xifaxan via tube.  Patient is having bowel movements, no obvious hematochezia or melena.  I went to the bedside to see the patient.  I attempted to elicit a response via verbal and mild to moderate tactile stimuli, no attempt to elicit response to pain.  Patient attempted to open her eyes but did not make eye contact.  She is unable to meaningfully communicate.  I called the patient's husband Rachel Huber.  I updated him on her current care.  The patient lives at home and states that while she was at rehab she rolled out of bed and fell.  Since she has been at rehab he feels like he is gotten worse.  We discussed evaluation with liver transplant team at [facility].  He states that she was taken off the list until she get stronger and get out of the nursing home.  He is hopeful for continued improvement.  He wants to continue with full code and full scope of care despite her significant illness.  She is apparently had this similar situation in the past and is done well and improved.  I shared that I would follow-up in a couple days for ongoing discussions and update on her progress.  I provided emotional and general support through therapeutic listening, empathy,  sharing of stories, and other techniques. I answered all questions and addressed all concerns to the best of my ability.  Review of Systems  Unable to perform ROS: Mental status change    Objective:   Vital Signs:  BP (!) 113/52 (BP Location: Left Arm)    Pulse 89   Temp 98.4 F (36.9 C) (Oral)   Resp 16   Ht 5\' 9"  (1.753 m)   Wt 135.2 kg   SpO2 99%   BMI 44.01 kg/m   Physical Exam Vitals and nursing note reviewed.  Constitutional:      General: She is sleeping. She is not in acute distress.    Comments: Did not awaken to verbal or tactile stimuli  HENT:     Head: Normocephalic and atraumatic.  Cardiovascular:     Rate and Rhythm: Normal rate.  Pulmonary:     Effort: Pulmonary effort is normal. No respiratory distress.  Abdominal:     General: Abdomen is flat.     Palpations: Abdomen is soft.     Comments: Noted NG tube in place  Skin:    General: Skin is warm and dry.  Neurological:     Mental Status: She is unresponsive.     Palliative Assessment/Data: 10% currently    Existing Vynca/ACP Documentation: None  Assessment & Plan:   Impression: Present on Admission:  Hepatic encephalopathy (HCC)  AMS (altered mental status)  Anemia in chronic kidney disease (CKD)  Aortic atherosclerosis (HCC)  Beta thalassemia intermedia (HCC)  Chronic kidney disease, stage 3b (HCC)  Depression with anxiety  HLD (hyperlipidemia)  Type 2 diabetes mellitus with hyperlipidemia (HCC)  Hypokalemia  SUMMARY OF RECOMMENDATIONS   Continue full code Full scope of care Family hopeful for improvement based on past experience Ongoing GOC conversations pending progression of clinical course Palliative medicine will follow-up on Wednesday  Symptom Management:  Per primary team PMT is available to assist as needed  Code Status: Full code  Prognosis: Unable to determine  Discharge Planning: To Be Determined  Discussed with: Patient's family, medical team, nursing team  Thank you for allowing [place] to participate in the care of Rachel Huber PMT will continue to support holistically.  Time Total: 30 min  Detailed review of medical records (labs, imaging, vital signs), medically appropriate exam, discussed with treatment team,  counseling and education to patient, family, & staff, documenting clinical information, medication management, coordination of care  Wynne Dust, NP Palliative Medicine Team  Team Phone # [phone] (Nights/Weekends)  04/16/2021, 8:17 AM

## 2023-08-10 NOTE — TOC Progression Note (Signed)
Transition of Care [facility]) - Progression Note    Patient Details  Name: Rachel Huber MRN: [id number] Date of Birth: 1956-01-09  Transition of Care [facility]) CM/SW Contact  Allena Katz, LCSW Phone Number: 08/10/2023, 10:05 AM  Clinical Narrative:    GI to consult as pt is being recommended for a NG tube. Pt still experiencing confusion.    Expected Discharge Plan: Skilled Nursing Facility Barriers to Discharge: Continued Medical Work up  Expected Discharge Plan and Services                                               Social Determinants of Health (SDOH) Interventions SDOH Screenings   Food Insecurity: Patient Unable To Answer (07/29/2023)  Housing: Patient Unable To Answer (07/29/2023)  Transportation Needs: Patient Unable To Answer (07/29/2023)  Utilities: Patient Unable To Answer (07/29/2023)  Alcohol Screen: Low Risk  (04/04/2022)  Depression (PHQ2-9): Low Risk  (04/27/2023)  Financial Resource Strain: Low Risk  (06/03/2023)   Received from [facility]  Physical Activity: Inactive (04/04/2022)  Social Connections: Moderately Isolated (04/04/2022)  Stress: No Stress Concern Present (04/04/2022)  Tobacco Use: Medium Risk (07/28/2023)    Readmission Risk Interventions    04/30/2023   12:35 PM 12/19/2022   10:27 AM 11/04/2022   11:32 AM  Readmission Risk Prevention Plan  Transportation Screening Complete Complete Complete  Medication Review Oceanographer) Complete Complete Complete  PCP or Specialist appointment within 3-5 days of discharge Not Complete Complete Complete  HRI or Home Care Consult Complete Complete   SW Recovery Care/Counseling Consult Complete Complete Complete  Palliative Care Screening Not Applicable Not Applicable Not Applicable  Skilled Nursing Facility Not Complete Not Applicable Not Applicable

## 2023-08-10 NOTE — Consult Note (Signed)
PHARMACY CONSULT NOTE - ELECTROLYTES  Pharmacy Consult for Electrolyte Monitoring and Replacement   Recent Labs: Height: 5\' 9"  (175.3 cm) Weight: 135.2 kg (298 lb) IBW/kg (Calculated) : 66.2 Estimated Creatinine Clearance: 82.2 mL/min (A) (by C-G formula based on SCr of 1.01 mg/dL (H)). Potassium (mmol/L)  Date Value  08/10/2023 3.7   Magnesium (mg/dL)  Date Value  16/05/9603 2.0   Calcium (mg/dL)  Date Value  54/04/8118 9.6   Albumin (g/dL)  Date Value  14/78/2956 2.5 (L)   Phosphorus (mg/dL)  Date Value  21/30/8657 3.3   Sodium (mmol/L)  Date Value  08/10/2023 152 (H)   Assessment  Rachel Huber is a 67 y.o. female presenting with hepatic encephalopathy. PMH significant for DM, CKD, NASH w/ cirrhosis, HLD, depression and anxiety . Pharmacy has been consulted to monitor and replace electrolytes.  Diet: Patient starting tube feeding MIVF: D5W @ 50 mL/hr Pertinent medications: N/A  Goal of Therapy: Electrolytes WNL  Plan:  No replacement indicated at this time Check BMP, Mg, Phos with AM labs  Thank you for allowing pharmacy to be a part of this patient's care.  Barrie Folk, PharmD Clinical Pharmacist 08/10/2023 12:29 PM

## 2023-08-10 NOTE — Evaluation (Signed)
Occupational Therapy Re-Evaluation Patient Details Name: Rachel Huber MRN: [id number] DOB: 1956/08/05 Today's Date: 08/10/2023   History of Present Illness Pt is a 67 y/o F admitted on 07/28/23 after presenting with c/o AMS. Pt was diagnosed with hepatic encephalopathy. Pt had a fall while in hospital resulting in L intertrochanteric hip fx, had nail placed 12/19. On 12/21 pt became unresponsive, with elevated ammonia level, & NG tube was placed. Pt is being treated for hepatic encephalopathy, decompensated NASH cirrhosis with ascites. PMH: NIDDM, CKD 3B, NASH liver cirrhosis, thalassemia minor   Clinical Impression   Pt seen for OT re-evaluation and co-tx with PT to attempt ADL/mobility. Pt lethargic, opens eyes briefly intermittently during session with heavy cuing. Pt required MAX A +2 for sup<> sit EOB, initially requiring MAX A to maintain static sitting balance but improving with time to SBA briefly, requiring more/less assist at times. Pt required MAX A for washing her face while in sitting. Pt able to follow <25% of simple commands during session. Pt continues to benefit from skilled OT Services to maximize safety, independence, and minimize caregiver burden and further functional decline.       If plan is discharge home, recommend the following: Two people to help with walking and/or transfers;Two people to help with bathing/dressing/bathroom;Assistance with cooking/housework;Assist for transportation;Help with stairs or ramp for entrance;Direct supervision/assist for medications management;Supervision due to cognitive status    Functional Status Assessment  Patient has had a recent decline in their functional status and demonstrates the ability to make significant improvements in function in a reasonable and predictable amount of time.  Equipment Recommendations  Other (comment) (defer to next venue)    Recommendations for Other Services       Precautions / Restrictions  Precautions Precautions: Fall Restrictions Weight Bearing Restrictions Per Provider Order: Yes LLE Weight Bearing Per Provider Order: Weight bearing as tolerated      Mobility Bed Mobility Overal bed mobility: Needs Assistance Bed Mobility: Sit to Supine, Supine to Sit     Supine to sit: Max assist, +2 for physical assistance Sit to supine: Max assist, +2 for physical assistance        Transfers                          Balance Overall balance assessment: Needs assistance Sitting-balance support: Feet unsupported, Bilateral upper extremity supported Sitting balance-Leahy Scale: Poor Sitting balance - Comments: varying assist required, initially MAX A improving to SBA briefly                                   ADL either performed or assessed with clinical judgement   ADL Overall ADL's : Needs assistance/impaired     Grooming: Sitting;Total assistance;Wash/dry face Grooming Details (indicate cue type and reason): intermittent increased assist for sitting balance from PT while requiring TOTAL  Afor washing face                               General ADL Comments: Anticipate pt requires TOTAL A for LB ADL tasks, MAX A for UB ADL tasks, and currently MAX A for grooming tasks. NPO. Very limited 2/2 cognition.     Vision         Perception         Praxis  Pertinent Vitals/Pain Pain Assessment Pain Assessment: Faces Faces Pain Scale: Hurts even more Pain Location: L hip with movement Pain Descriptors / Indicators: Moaning, Grimacing, Guarding Pain Intervention(s): Limited activity within patient's tolerance, Monitored during session, Repositioned     Extremity/Trunk Assessment Upper Extremity Assessment Upper Extremity Assessment: Difficult to assess due to impaired cognition   Lower Extremity Assessment Lower Extremity Assessment: Difficult to assess due to impaired cognition LLE Deficits / Details: grimacing 2/2  pain when LLE moved; BLE resistive to movements throughout session at times LLE: Unable to fully assess due to pain       Communication Communication Communication: Difficulty following commands/understanding;Difficulty communicating thoughts/reduced clarity of speech   Cognition Arousal: Lethargic Behavior During Therapy: Flat affect Overall Cognitive Status: Impaired/Different from baseline Area of Impairment: Following commands, Safety/judgement, Problem solving                       Following Commands: Follows one step commands inconsistently Safety/Judgement: Decreased awareness of safety, Decreased awareness of deficits   Problem Solving: Slow processing, Decreased initiation, Requires verbal cues, Difficulty sequencing, Requires tactile cues       General Comments       Exercises Other Exercises Other Exercises: Pt tolerated sitting EOB <54min wiht varying assist required to maintain static sitting, able to follow <25% of simple 1 step commands with multimodal cues. Kept eyes closed for majority of session.   Shoulder Instructions      Home Living Family/patient expects to be discharged to:: Skilled nursing facility (information obtained from previous chart entry)                                 Additional Comments: recently at SNF for rehab. prior notes indicated patient lives at home with spouse      Prior Functioning/Environment Prior Level of Function : Needs assist (information obtained from previous chart entry)             Mobility Comments: limited distance ambulation with rolling walker ADLs Comments: presume assistance requried at rehab, however, pt unable to state how much.        OT Problem List: Decreased strength;Decreased coordination;Decreased cognition;Decreased range of motion;Decreased activity tolerance;Decreased safety awareness;Impaired balance (sitting and/or standing);Decreased knowledge of use of DME or  AE;Impaired UE functional use;Pain      OT Treatment/Interventions: Self-care/ADL training;Therapeutic exercise;Therapeutic activities;Cognitive remediation/compensation;Neuromuscular education;DME and/or AE instruction;Patient/family education;Balance training;Energy conservation    OT Goals(Current goals can be found in the care plan section) Acute Rehab OT Goals Patient Stated Goal: pt unable to state OT Goal Formulation: Patient unable to participate in goal setting Time For Goal Achievement: 08/24/23 Potential to Achieve Goals: Fair ADL Goals Pt Will Perform Grooming: with mod assist;sitting Pt Will Perform Upper Body Dressing: sitting;with max assist Pt Will Transfer to Toilet: squat pivot transfer;bedside commode;with +2 assist;with max assist (LRAD) Pt Will Perform Toileting - Clothing Manipulation and hygiene: sitting/lateral leans;with max assist  OT Frequency: Min 1X/week    Co-evaluation PT/OT/SLP Co-Evaluation/Treatment: Yes Reason for Co-Treatment: Complexity of the patient's impairments (multi-system involvement);For patient/therapist safety PT goals addressed during session: Mobility/safety with mobility;Balance OT goals addressed during session: ADL's and self-care      AM-PAC OT "6 Clicks" Daily Activity     Outcome Measure Help from another person eating meals?: Total Help from another person taking care of personal grooming?: A Lot Help from another person toileting, which includes using  toliet, bedpan, or urinal?: Total Help from another person bathing (including washing, rinsing, drying)?: Total Help from another person to put on and taking off regular upper body clothing?: A Lot Help from another person to put on and taking off regular lower body clothing?: Total 6 Click Score: 8   End of Session    Activity Tolerance: Patient tolerated treatment well Patient left: in bed;with call bell/phone within reach;with bed alarm set  OT Visit Diagnosis: Other  abnormalities of gait and mobility (R26.89);Muscle weakness (generalized) (M62.81);Other symptoms and signs involving cognitive function;History of falling (Z91.81);Pain Pain - Right/Left: Left Pain - part of body: Hip                Time: 1012-1025 OT Time Calculation (min): 13 min Charges:  OT General Charges $OT Visit: 1 Visit OT Evaluation $OT Re-eval: 1 Re-eval  Arman Filter., MPH, MS, OTR/L ascom [phone] 08/10/23, 10:40 AM

## 2023-08-10 NOTE — Evaluation (Signed)
Physical Therapy Re-Evaluation Patient Details Name: Rachel Huber MRN: [id number] DOB: January 22, 1956 Today's Date: 08/10/2023  History of Present Illness  Pt is a 67 y/o F admitted on 07/28/23 after presenting with c/o AMS. Pt was diagnosed with hepatic encephalopathy. Pt had a fall while in hospital resulting in L intertrochanteric hip fx, had nail placed 12/19. On 12/21 pt became unresponsive, with elevated ammonia level, & NG tube was placed. Pt is being treated for hepatic encephalopathy, decompensated NASH cirrhosis with ascites. PMH: NIDDM, CKD 3B, NASH liver cirrhosis, thalassemia minor  Clinical Impression  Pt seen for PT re-evaluation with OT for pt & therapists' safety.  Pt received in bed, asleep, mouth open. Attempted to increase pt's alertness by placing cold, wet washcloth on forehead, assisting pt with sitting EOB. Pt continues to keep eyes closed throughout session, briefly opening them only a few times. Pt is able to progress to sitting EOB with as little as close supervision for a few seconds at a time. Pt with BUE jerking while sitting EOB, behaviors exhibiting pain in LLE with movement. Will continue to follow pt acutely to progress mobility as able.      If plan is discharge home, recommend the following: Two people to help with walking and/or transfers;Two people to help with bathing/dressing/bathroom;Direct supervision/assist for medications management;Assistance with feeding   Can travel by private vehicle   No    Equipment Recommendations Other (comment) (defer to next venue)  Recommendations for Other Services       Functional Status Assessment Patient has had a recent decline in their functional status and demonstrates the ability to make significant improvements in function in a reasonable and predictable amount of time.     Precautions / Restrictions Precautions Precautions: Fall Restrictions Weight Bearing Restrictions Per Provider Order: Yes LLE Weight  Bearing Per Provider Order: Weight bearing as tolerated      Mobility  Bed Mobility Overal bed mobility: Needs Assistance Bed Mobility: Supine to Sit, Sit to Supine     Supine to sit: Total assist, HOB elevated, Used rails, +2 for safety/equipment, +2 for physical assistance Sit to supine: Total assist, HOB elevated, Used rails, +2 for safety/equipment, +2 for physical assistance   General bed mobility comments: +2 to scoot to [facility] with bed in [place] position    Transfers                        Ambulation/Gait                  Stairs            Wheelchair Mobility     Tilt Bed    Modified Rankin (Stroke Patients Only)       Balance Overall balance assessment: Needs assistance Sitting-balance support: Feet supported, Bilateral upper extremity supported Sitting balance-Leahy Scale: Poor Sitting balance - Comments: max assist fade to close supervision for very brief periods of time (2-5 seconds)                                     Pertinent Vitals/Pain Pain Assessment Pain Assessment: PAINAD Breathing: normal Negative Vocalization: occasional moan/groan, low speech, negative/disapproving quality Facial Expression: facial grimacing Body Language: tense, distressed pacing, fidgeting Consolability: distracted or reassured by voice/touch PAINAD Score: 5 Pain Location: L hip with movement    Home Living Family/patient expects to be discharged to:: Skilled nursing  facility (information obtained from previous chart entry)                   Additional Comments: recently at SNF for rehab. prior notes indicated patient lives at home with spouse    Prior Function Prior Level of Function : Needs assist (information obtained from previous chart entry)             Mobility Comments: limited distance ambulation with rolling walker ADLs Comments: presume assistance requried at rehab, however, pt unable to state how  much.     Extremity/Trunk Assessment   Upper Extremity Assessment Upper Extremity Assessment: Difficult to assess due to impaired cognition    Lower Extremity Assessment Lower Extremity Assessment: Difficult to assess due to impaired cognition LLE Deficits / Details: grimacing 2/2 pain when LLE moved; BLE resistive to movements throughout session at times LLE: Unable to fully assess due to pain       Communication   Communication Communication: Difficulty following commands/understanding;Difficulty communicating thoughts/reduced clarity of speech  Cognition Arousal: Lethargic Behavior During Therapy: Flat affect Overall Cognitive Status: Impaired/Different from baseline Area of Impairment: Following commands, Safety/judgement, Problem solving, Awareness, Orientation, Attention, Memory                 Orientation Level: Disoriented to, Person, Place, Time, Situation     Following Commands: Follows one step commands inconsistently, Follows one step commands with increased time Safety/Judgement: Decreased awareness of safety, Decreased awareness of deficits Awareness: Intellectual   General Comments: Pt unable to follow cuing throughout session despite multimodal cuing. Pt keeps eyes closed throughout majority of session despite cuing, therapist placing cold, wet washcloth on forehead. Does somewhat respond to noxious stimuli.        General Comments      Exercises     Assessment/Plan    PT Assessment Patient needs continued PT services  PT Problem List Decreased strength;Cardiopulmonary status limiting activity;Pain;Decreased coordination;Decreased range of motion;Decreased activity tolerance;Decreased balance;Decreased mobility;Decreased safety awareness;Decreased knowledge of precautions;Decreased knowledge of use of DME;Decreased cognition;Impaired sensation       PT Treatment Interventions DME instruction;Balance training;Modalities;Neuromuscular  re-education;Gait training;Stair training;Functional mobility training;Therapeutic activities;Therapeutic exercise;Manual techniques;Patient/family education;Cognitive remediation    PT Goals (Current goals can be found in the Care Plan section)  Acute Rehab PT Goals PT Goal Formulation: Patient unable to participate in goal setting Time For Goal Achievement: 08/24/23 Potential to Achieve Goals: Fair    Frequency Min 1X/week     Co-evaluation PT/OT/SLP Co-Evaluation/Treatment: Yes Reason for Co-Treatment: Complexity of the patient's impairments (multi-system involvement);For patient/therapist safety PT goals addressed during session: Mobility/safety with mobility;Balance OT goals addressed during session: ADL's and self-care       AM-PAC PT "6 Clicks" Mobility  Outcome Measure Help needed turning from your back to your side while in a flat bed without using bedrails?: Total Help needed moving from lying on your back to sitting on the side of a flat bed without using bedrails?: Total Help needed moving to and from a bed to a chair (including a wheelchair)?: Total Help needed standing up from a chair using your arms (e.g., wheelchair or bedside chair)?: Total Help needed to walk in hospital room?: Total Help needed climbing 3-5 steps with a railing? : Total 6 Click Score: 6    End of Session Equipment Utilized During Treatment: Oxygen (2L via nasal cannula) Activity Tolerance: Patient limited by fatigue;Patient limited by lethargy Patient left: in bed;with call bell/phone within reach;with bed alarm set (telesitter in  room)   PT Visit Diagnosis: Muscle weakness (generalized) (M62.81);Other abnormalities of gait and mobility (R26.89);Difficulty in walking, not elsewhere classified (R26.2);Pain Pain - Right/Left: Left Pain - part of body: Hip    Time: 0454-0981 PT Time Calculation (min) (ACUTE ONLY): 18 min   Charges:   PT Evaluation $PT Re-evaluation: 1 Re-eval   PT  General Charges $$ ACUTE PT VISIT: 1 Visit         Aleda Grana, PT, DPT 08/10/23, 10:41 AM   Sandi Mariscal 08/10/2023, 10:40 AM

## 2023-08-10 NOTE — Plan of Care (Signed)
Patient lethargic and non verbal tonight. Slept all shift. Repositioned frequently. Continues to have liquid stools, flexiseal remained in place. Will continue to monitor.   Problem: Elimination: Goal: Will not experience complications related to bowel motility Outcome: Progressing Goal: Will not experience complications related to urinary retention Outcome: Progressing   Problem: Pain Management: Goal: General experience of comfort will improve Outcome: Progressing

## 2023-08-10 NOTE — Care Management Important Message (Signed)
Important Message  Patient Details  Name: Rachel Huber MRN: [id number] Date of Birth: 1956-03-13   Important Message Given:  Yes - Medicare IM     Bernadette Hoit 08/10/2023, 11:09 AM

## 2023-08-10 NOTE — Progress Notes (Signed)
Progress Note   Patient: Rachel Huber FAO:[id number] DOB: 12-May-1956 DOA: 07/28/2023     13 DOS: the patient was seen and examined on 08/10/2023   Brief hospital course: 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  Patient was diagnosed with hepatic encephalopathy, was given lactulose and IV fluids.  Patient mental status has been waxing and waning.  Patient had a fall while in the hospital, sustained a new left intertrochanteric hip fracture, had intertrochanteric nail performed on 12/19. 12/21.  Patient became unresponsive, ammonia level went up to more than 400, NG tube was placed, patient was given lactulose.  Received 2 units of PRBC. 12/22.  Patient became more responsive today, still very confused. 12/23: Patient remained quite lethargic, not following any commands and appears significantly dry with sodium of 152-started on D5 with hypotonic saline.  High risk for refeeding syndrome so close monitoring of electrolytes required.  Hemoglobin at 7.3, anemia panel consistent with anemia of chronic disease.  Having bowel movements.  High risk for deterioration and mortality-palliative care is on board but currently patient remained full code   Principal Problem:   Hepatic encephalopathy (HCC) Active Problems:   Chronic kidney disease, stage 3b (HCC)   HLD (hyperlipidemia)   Depression with anxiety   Type 2 diabetes mellitus with hyperlipidemia (HCC)   OSA on CPAP   Anemia in chronic kidney disease (CKD)   Hypokalemia    AMS (altered mental status)   Aortic atherosclerosis (HCC)   Beta thalassemia intermedia (HCC)   Obesity, Class III, BMI 40-49.9 (morbid obesity) (HCC)   Hypomagnesemia   Intertrochanteric fracture of left hip, sequela   Assessment and Plan: Hepatic encephalopathy (HCC) Decompensated NASH cirrhosis with ascites. Suspect secondary to patient refusal to take lactulose at nursing facility Patient has been treated with lactulose, ammonia level seem to be improving.  However, patient has a worsening mental status 12/21, she became unresponsive.  Ammonia level increased to 463. Due to altered mental status, patient has not been able to take lactulose since yesterday evening, GI consult was obtained, lactulose was given after NG tube was placed.  So far blood culture has been negative, mental status seem to be better today, no need for additional prophylactic treatment with Rocephin. Due to altered mental status, not safe for oral intake, I will utilize the NG tube to start tube feeding until mental status improves. Her MRI of the brain in September 2024 showed - 2.2 x 1.5 cm heavily calcified meningioma in the right middle cranial fossa with some mass effect on the lateral aspect of the right temporal tip. MRI brain is unchanged from before Patient long-term prognosis is very poor, palliative care is following.   Anemia in chronic kidney disease (CKD) Patient baseline hemoglobin is around 7-9, hemoglobin dropped down to 6.4 ED yesterday, further dropped down to 5.4 today.  Discussed with the nurse, she has not observed any black stool or rectal bleeding.  Patient's status post TIPS procedure, which she could reduce the risk of GI bleed from esophageal varices.  I briefly put her on IV Protonix, after seen by GI,  no concern for GI bleed.  As result, no need for Protonix.  Patient received 2 units of PRBC, hemoglobin went up to 7.5.  Continue to follow. Hemoglobin 7.3 today, anemia panel consistent with  anemia of chronic disease -Continue to monitor and transfuse below 7   Acute on chronic kidney disease, stage 3b (HCC) Mild metabolic acidosis. Hypokalemia. Hypomagnesemia. Hypophosphatemia Renal function much improved today, potassium still low, will give potassium phosphate.  Also added potassium into IV fluids.    Acute left intertrochanteric hip fracture Status post fall on 12/19 around 4 AM.  Per report she was trying to get out of the bed by herself to go to the bathroom.   Underwent head CT which was negative for acute findings. Left hip x-ray showed acute fracture -status post left hip cephalomedullary nail in place orthopedics on 12/19 Pain medicine as needed as ordered Aspirin for DVT prophylaxis Weightbearing as tolerated on the left hip per orthopedic.     Obesity, Class III, BMI 40-49.9 (morbid obesity) (HCC) Obstruct sleep apnea on CPAP. Complicating factor to overall care and prognosis Continue CPAP while asleep.      Subjective:  Patient was quite lethargic and barely opening eyes when called her name, not following any commands.  Physical Exam: Vitals:   08/09/23 1601 08/09/23 2005 08/10/23 0434 08/10/23 0816  BP: 124/61 (!) 135/58 (!) 144/51 (!) 113/52  Pulse: 77 72 97 89  Resp:  19 18 16   Temp: 98.4 F (36.9 C) 98.4 F (36.9 C) 98.4 F (36.9 C)   TempSrc:  Oral Oral   SpO2: 100% 100% 98% 99%  Weight:      Height:       General.  Lethargic lady, in no acute distress. Pulmonary.  Lungs clear bilaterally, normal respiratory effort. CV.  Regular rate and rhythm, no JVD, rub or murmur. Abdomen.  Soft, nontender, nondistended, BS positive. CNS.  Somnolent and lethargic, not following any commands. Extremities.  No edema, no cyanosis, pulses intact and symmetrical.  Data Reviewed:  Lab results reviewed.  Family Communication: Talked with husband on phone.  Disposition: Status is: Inpatient Remains inpatient appropriate because: Severity of  disease, IV treatment.  DVT Phrophylaxis. heparin Time spent: 50 minutes  This record has been created using Conservation officer, historic buildings. Errors have been sought and corrected,but may not always be located. Such creation errors do not reflect on the standard of care.   Author: Arnetha Courser, MD 08/10/2023 3:34 PM  For on call review [email or web address].

## 2023-08-11 DIAGNOSIS — K7682 Hepatic encephalopathy: Secondary | ICD-10-CM | POA: Diagnosis not present

## 2023-08-11 DIAGNOSIS — R4182 Altered mental status, unspecified: Secondary | ICD-10-CM | POA: Diagnosis not present

## 2023-08-11 DIAGNOSIS — F418 Other specified anxiety disorders: Secondary | ICD-10-CM | POA: Diagnosis not present

## 2023-08-11 DIAGNOSIS — N1832 Chronic kidney disease, stage 3b: Secondary | ICD-10-CM | POA: Diagnosis not present

## 2023-08-11 LAB — PHOSPHORUS
Phosphorus: 2.4 mg/dL — ABNORMAL LOW (ref 2.5–4.6)
Phosphorus: 3.2 mg/dL (ref 2.5–4.6)

## 2023-08-11 LAB — BASIC METABOLIC PANEL
Anion gap: 6 (ref 5–15)
Anion gap: 6 (ref 5–15)
BUN: 38 mg/dL — ABNORMAL HIGH (ref 8–23)
BUN: 45 mg/dL — ABNORMAL HIGH (ref 8–23)
CO2: 25 mmol/L (ref 22–32)
CO2: 26 mmol/L (ref 22–32)
Calcium: 9.6 mg/dL (ref 8.9–10.3)
Calcium: 9.4 mg/dL (ref 8.9–10.3)
Chloride: 124 mmol/L — ABNORMAL HIGH (ref 98–111)
Chloride: 122 mmol/L — ABNORMAL HIGH (ref 98–111)
Creatinine, Ser: 0.86 mg/dL (ref 0.44–1.00)
Creatinine, Ser: 1 mg/dL (ref 0.44–1.00)
GFR, Estimated: >60 mL/min (ref >60)
GFR, Estimated: >60 mL/min (ref >60)
Glucose, Bld: 224 mg/dL — ABNORMAL HIGH (ref 70–99)
Glucose, Bld: 259 mg/dL — ABNORMAL HIGH (ref 70–99)
Potassium: 3.6 mmol/L (ref 3.5–5.1)
Potassium: 3.9 mmol/L (ref 3.5–5.1)
Sodium: 155 mmol/L — ABNORMAL HIGH (ref 135–145)
Sodium: 154 mmol/L — ABNORMAL HIGH (ref 135–145)

## 2023-08-11 LAB — GLUCOSE, CAPILLARY
Glucose-Capillary: 203 mg/dL — ABNORMAL HIGH (ref 70–99)
Glucose-Capillary: 230 mg/dL — ABNORMAL HIGH (ref 70–99)
Glucose-Capillary: 232 mg/dL — ABNORMAL HIGH (ref 70–99)
Glucose-Capillary: 217 mg/dL — ABNORMAL HIGH (ref 70–99)
Glucose-Capillary: 231 mg/dL — ABNORMAL HIGH (ref 70–99)

## 2023-08-11 LAB — MAGNESIUM
Magnesium: 2.3 mg/dL (ref 1.7–2.4)
Magnesium: 2.2 mg/dL (ref 1.7–2.4)

## 2023-08-11 MED ORDER — INSULIN ASPART 100 UNIT/ML IJ SOLN
0.0000 [IU] | INTRAMUSCULAR | Status: DC
  Administered 2023-08-11 (×4): 5 [IU] via SUBCUTANEOUS
  Administered 2023-08-12: 3 [IU] via SUBCUTANEOUS
  Administered 2023-08-12: 8 [IU] via SUBCUTANEOUS
  Administered 2023-08-12 (×2): 5 [IU] via SUBCUTANEOUS
  Administered 2023-08-12: 8 [IU] via SUBCUTANEOUS
  Administered 2023-08-12: 5 [IU] via SUBCUTANEOUS
  Administered 2023-08-13 (×2): 8 [IU] via SUBCUTANEOUS
  Administered 2023-08-13: 5 [IU] via SUBCUTANEOUS
  Administered 2023-08-13: 3 [IU] via SUBCUTANEOUS
  Administered 2023-08-13 (×2): 5 [IU] via SUBCUTANEOUS
  Administered 2023-08-14: 3 [IU] via SUBCUTANEOUS
  Administered 2023-08-14 (×5): 5 [IU] via SUBCUTANEOUS
  Administered 2023-08-15 (×2): 2 [IU] via SUBCUTANEOUS
  Administered 2023-08-15: 3 [IU] via SUBCUTANEOUS
  Administered 2023-08-15: 5 [IU] via SUBCUTANEOUS
  Administered 2023-08-15 – 2023-08-16 (×3): 3 [IU] via SUBCUTANEOUS
  Administered 2023-08-16 (×2): 2 [IU] via SUBCUTANEOUS
  Administered 2023-08-16: 3 [IU] via SUBCUTANEOUS
  Administered 2023-08-16 – 2023-08-17 (×4): 2 [IU] via SUBCUTANEOUS
  Filled 2023-08-11 (×34): qty 1

## 2023-08-11 MED ORDER — METOPROLOL TARTRATE 25 MG PO TABS: 12.5000 mg | ORAL_TABLET | Freq: Two times a day (BID) | ORAL | Status: DC

## 2023-08-11 MED ORDER — K PHOS MONO-SOD PHOS DI & MONO 155-852-130 MG PO TABS
500.0000 mg | ORAL_TABLET | ORAL | Status: AC
  Administered 2023-08-11 (×2): 500 mg
  Filled 2023-08-11 (×2): qty 2

## 2023-08-11 MED ORDER — DEXTROSE 5 % IV SOLN
INTRAVENOUS | Status: AC
Start: 2023-08-11 — End: 2023-08-12

## 2023-08-11 MED ORDER — INSULIN ASPART 100 UNIT/ML IJ SOLN
4.0000 [IU] | INTRAMUSCULAR | Status: DC
  Administered 2023-08-11 – 2023-08-12 (×5): 4 [IU] via SUBCUTANEOUS
  Filled 2023-08-11 (×5): qty 1

## 2023-08-11 MED ORDER — METOPROLOL TARTRATE 25 MG PO TABS
12.5000 mg | ORAL_TABLET | Freq: Two times a day (BID) | ORAL | Status: DC
Start: 2023-08-11 — End: 2023-08-17
  Administered 2023-08-11 – 2023-08-17 (×13): 12.5 mg via NASOGASTRIC
  Filled 2023-08-11 (×13): qty 1

## 2023-08-11 MED ORDER — FREE WATER
200.0000 mL | Status: DC
Start: 2023-08-11 — End: 2023-08-12
  Administered 2023-08-11 – 2023-08-12 (×5): 200 mL

## 2023-08-11 NOTE — Progress Notes (Signed)
Progress Note   Patient: JANY SLOBODA WUJ:[id number] DOB: [birth year] DOA: 07/28/2023     14 DOS: the patient was seen and examined on 08/11/2023   Brief hospital course: Ms. 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  Patient was diagnosed with hepatic encephalopathy, was given lactulose and IV fluids.  Patient mental status has been waxing and waning.  Patient had a fall while in the hospital, sustained a new left intertrochanteric hip fracture, had intertrochanteric nail performed on 12/19. 12/21.  Patient became unresponsive, ammonia level went up to more than 400, NG tube was placed, patient was given lactulose.  Received 2 units of PRBC. 12/22.  Patient became more responsive today, still very confused. 12/23: Patient remained quite lethargic, not following any commands and appears significantly dry with sodium of 152-started on D5 with hypotonic saline.  High risk for refeeding syndrome so close monitoring of electrolytes required.  Hemoglobin at 7.3, anemia panel consistent with anemia of chronic disease.  Having bowel movements.  High risk for deterioration and mortality-palliative care is on board but currently patient remained full code.  12/24: Patient remained pretty much unresponsive, minimum response to painful stimuli, worsening hypernatremia and hyperglycemia since she was started on tube feed.  Increasing free water, D5 and adding SSI. Family remain very optimistic for her full recovery and want everything to be  done.   Principal Problem:   Hepatic encephalopathy (HCC) Active Problems:   Chronic kidney disease, stage 3b (HCC)   HLD (hyperlipidemia)   Depression with anxiety   Type 2 diabetes mellitus with hyperlipidemia (HCC)   OSA on CPAP   Anemia in chronic kidney disease (CKD)   Hypokalemia   AMS (altered mental status)   Aortic atherosclerosis (HCC)   Beta thalassemia intermedia (HCC)   Obesity, Class III, BMI 40-49.9 (morbid obesity) (HCC)   Hypomagnesemia   Intertrochanteric fracture of left hip, sequela   Assessment and Plan: Hepatic encephalopathy (HCC) Decompensated NASH cirrhosis with ascites. Suspect secondary to patient refusal to take lactulose at nursing facility Patient has been treated with lactulose, ammonia level seem to be improving.  Peaked at 643.  However, patient has a worsening mental status 12/21, she became unresponsive. Due to altered mental status, patient has not been able to take lactulose  GI consult was obtained, lactulose was given after NG tube was placed.  So far blood culture has been negative,  no need for additional prophylactic treatment with Rocephin. Due to altered mental status, not safe for oral intake, started on tube feed. Her MRI of the brain in September 2024 showed - 2.2 x 1.5 cm heavily calcified meningioma in the right middle cranial fossa with some mass effect on the lateral aspect of the right temporal tip. MRI brain is unchanged from before Patient long-term prognosis is very poor, palliative care is following. -Continue with supportive care  Hypernatremia.  Worsening hyponatremia with sodium at 155, free water deficit of around 5 L. -Increase D5 rate to 100 mg/h -Increase free water through NG tube -Monitor sodium   Anemia in chronic kidney disease (CKD)   Patient received 2  units of PRBC, hemoglobin went up to 7.5.  Continue to follow.  No concern of GI bleed. Hemoglobin 7.3 today, anemia panel consistent with anemia of chronic  disease -Continue to monitor and transfuse below 7   Acute on chronic kidney disease, stage 3b (HCC) Mild metabolic acidosis. Hypokalemia. Hypomagnesemia. Hypophosphatemia Renal function much improved today, potassium low normal, will give potassium phosphate.      Acute left intertrochanteric hip fracture Status post fall on 12/19 around 4 AM.  Per report she was trying to get out of the bed by herself to go to the bathroom.   Underwent head CT which was negative for acute findings. Left hip x-ray showed acute fracture -status post left hip cephalomedullary nail in place orthopedics on 12/19 Pain medicine as needed as ordered Aspirin for DVT prophylaxis Weightbearing as tolerated on the left hip per orthopedic.   Obesity class I. Estimated body mass index is 30.02 kg/m as calculated from the following:   Height as of this encounter: 5\' 9"  (1.753 m).   Weight as of this encounter: 92.2 kg.  Complicating factor to overall care and prognosis Continue CPAP while asleep.      Subjective:  Patient remained pretty much unresponsive, minimal response to painful stimuli.  Having bowel movements.  Physical Exam: Vitals:   08/11/23 0449 08/11/23 0453 08/11/23 0800 08/11/23 1241  BP:  (!) 149/59 (!) 148/65 (!) 159/63  Pulse:  (!) 102 (!) 114 (!) 123  Resp:  18 18   Temp:  99 F (37.2 C) 99.2 F (37.3 C)   TempSrc:   Oral   SpO2:  100% 100%   Weight: 92.2 kg     Height:       General.  Chronically ill-appearing pretty much unresponsive lady, in no acute distress. Pulmonary.  Lungs clear bilaterally, normal respiratory effort. CV.  Regular rate and rhythm, no JVD, rub or murmur. Abdomen.  Soft, nontender, nondistended, BS positive. CNS.  Minimum response to painful stimuli Extremities.  No edema, no cyanosis, pulses intact and symmetrical.   Data Reviewed:  Lab results reviewed.  Family Communication: Talked with husband on phone.  Disposition: Status is:  Inpatient Remains inpatient appropriate because: Severity of disease, IV treatment.  DVT Phrophylaxis. heparin Time spent: 50 minutes  This record has been created using Conservation officer, historic buildings. Errors have been sought and corrected,but may not always be located. Such creation errors do not reflect on the standard of care.   Author: Arnetha Courser, MD 08/11/2023 2:02 PM  For on call review [email or web address].

## 2023-08-11 NOTE — Inpatient Diabetes Management (Signed)
Inpatient Diabetes Program Recommendations  AACE/ADA: New Consensus Statement on Inpatient Glycemic Control (2015)  Target Ranges:  Prepandial:   less than 140 mg/dL      Peak postprandial:   less than 180 mg/dL (1-2 hours)      Critically ill patients:  140 - 180 mg/dL   Lab Results  Component Value Date   GLUCAP 230 (H) 08/11/2023   HGBA1C 6.8 (H) 03/25/2023    Review of Glycemic Control  Latest Reference Range & Units 08/06/23 14:04 08/06/23 22:41 08/08/23 06:58 08/10/23 16:28 08/11/23 04:33 08/11/23 08:06  Glucose-Capillary 70 - 99 mg/dL 409 (H) 811 (H) 914 (H) 229 (H) 203 (H) 230 (H)   Diabetes history: None  Current orders for Inpatient glycemic control:  Novolog 0-15 units Q4hours  Osmolite 1.5 running at 50 ml/hour  Inpatient Diabetes Program Recommendations:    -   Add Novolog 4 units Q4 hours Tube Feed Coverage (Do not give if tube feeds are stopped or held for any reason)  Thanks,  Christena Deem RN, MSN, BC-ADM Inpatient Diabetes Coordinator Team Pager [phone] (8a-5p)

## 2023-08-11 NOTE — Progress Notes (Signed)
[facility]  ROUNDING NOTE   Subjective:   Ill-appearing NG tube in place Eyes open to voice Mouth breather  Creatinine 0.86 Sodium 155  Objective:  Vital signs in last 24 hours:  Temp:  [97.7 F (36.5 C)-99.2 F (37.3 C)] 99.2 F (37.3 C) (12/24 0800) Pulse Rate:  [91-114] 114 (12/24 0800) Resp:  [18] 18 (12/24 0800) BP: (136-149)/(59-65) 148/65 (12/24 0800) SpO2:  [99 %-100 %] 100 % (12/24 0800) Weight:  [92.2 kg] 92.2 kg (12/24 0449)  Weight change:  Filed Weights   07/28/23 1710 08/11/23 0449  Weight: 135.2 kg 92.2 kg    Intake/Output: I/O last 3 completed shifts: In: 3587.1 [I.V.:1961.8; NG/GT:1071.7; IV Piggyback:553.7] Out: 3150 [Stool:3150]   Intake/Output this shift:  No intake/output data recorded.  Physical Exam: General: Ill appearing  Head: Normocephalic, atraumatic. NGT  Eyes: Anicteric  Lungs:  Clear to auscultation, normal effort  Heart: Regular rate and rhythm  Abdomen:  Soft, nontender  Extremities:  No peripheral edema.  Neurologic: Aroused to voice  Skin: No lesions       Basic Metabolic Panel: Recent Labs  Lab 08/07/23 0608 08/08/23 0601 08/09/23 0358 08/10/23 0500 08/10/23 1400 08/10/23 2041 08/11/23 0430  NA 142 144 145 152*  --   --  155*  K 4.7 4.0 3.2* 3.7  --   --  3.6  CL 105 111 114* 118*  --   --  124*  CO2 25 26 23 27   --   --  25  GLUCOSE 232* 160* 167* 115*  --   --  224*  BUN 43* 50* 44* 39*  --   --  38*  CREATININE 2.24* 2.02* 1.31* 1.01*  --   --  0.86  CALCIUM 9.8 9.6 9.5 9.6  --   --  9.6  MG  --   --  2.1 2.0 1.9 2.1 2.3  PHOS  --   --  2.0* 3.3 3.0 2.4* 2.4*    Liver Function Tests: Recent Labs  Lab 08/06/23 0835 08/07/23 0608 08/08/23 1459 08/09/23 0358 08/10/23 0500  AST 50* 36 42* 36 32  ALT 30 19 10 10 9   ALKPHOS 97 91 90 89 94  BILITOT 3.5* 1.9* 1.9* 2.1* 1.7*  PROT 6.2* 6.0* 5.6* 5.2* 5.2*  ALBUMIN 2.9* 2.8* 2.5* 2.5* 2.5*   No results for input(s): "LIPASE", "AMYLASE"  in the last 168 hours. Recent Labs  Lab 08/07/23 0608 08/08/23 0601 08/08/23 1459  AMMONIA 81* 463* 48*    CBC: Recent Labs  Lab 08/06/23 0835 08/07/23 0608 08/08/23 0601 08/08/23 1459 08/09/23 0358 08/10/23 0500  WBC 8.0 13.4* 9.1  --  6.2 6.2  HGB 7.5* 6.4* 5.4* 6.5* 7.5* 7.3*  HCT 24.7* 21.1* 18.5*  --  22.6* 23.5*  MCV 73.3* 73.8* 75.2*  --  78.2* 80.5  PLT 164 210 177  --  135* 143*    Cardiac Enzymes: No results for input(s): "CKTOTAL", "CKMB", "CKMBINDEX", "TROPONINI" in the last 168 hours.  BNP: Invalid input(s): "POCBNP"  CBG: Recent Labs  Lab 08/06/23 2241 08/08/23 0658 08/10/23 1628 08/11/23 0433 08/11/23 0806  GLUCAP 250* 162* 229* 203* 230*    Microbiology: Results for orders placed or performed during the hospital encounter of 07/28/23  Culture, blood (Routine X 2) w Reflex to ID Panel     Status: None (Preliminary result)   Collection Time: 08/08/23  2:54 PM   Specimen: BLOOD  Result Value Ref Range Status   Specimen Description  BLOOD BLOOD LEFT ARM  Final   Special Requests   Final    BOTTLES DRAWN AEROBIC AND ANAEROBIC Blood Culture adequate volume   Culture   Final    NO GROWTH 3 DAYS Performed at [facility], [address] [place], [place] [address]    Report Status PENDING  Incomplete  Culture, blood (Routine X 2) w Reflex to ID Panel     Status: None (Preliminary result)   Collection Time: 08/08/23  2:59 PM   Specimen: BLOOD  Result Value Ref Range Status   Specimen Description BLOOD RIGHT ANTECUBITAL  Final   Special Requests   Final    BOTTLES DRAWN AEROBIC AND ANAEROBIC Blood Culture adequate volume   Culture   Final    NO GROWTH 3 DAYS Performed at [facility], [address], [place], [place] [address]    Report Status PENDING  Incomplete   *Note: Due to a large number of results and/or encounters for the requested time period, some results have not been displayed. A complete set of results can be  found in Results Review.    Coagulation Studies: Recent Labs    08/08/23 1459 08/09/23 0749  LABPROT 27.7* 25.4*  INR 2.6* 2.3*    Urinalysis: No results for input(s): "COLORURINE", "LABSPEC", "PHURINE", "GLUCOSEU", "HGBUR", "BILIRUBINUR", "KETONESUR", "PROTEINUR", "UROBILINOGEN", "NITRITE", "LEUKOCYTESUR" in the last 72 hours.  Invalid input(s): "APPERANCEUR"    Imaging: No results found.    Medications:    dextrose 100 mL/hr at 08/11/23 8413   feeding supplement (OSMOLITE 1.5 CAL) 50 mL/hr at 08/11/23 2440    Chlorhexidine Gluconate Cloth  6 each Topical Daily   cyanocobalamin  1,000 mcg Per Tube Daily   feeding supplement (PROSource TF20)  60 mL Per Tube BID   folic acid  1 mg Per Tube Daily   free water  30 mL Per Tube Q4H   heparin  5,000 Units Subcutaneous Q8H   insulin aspart  0-15 Units Subcutaneous Q4H   lactulose  300 mL Rectal QID   Or   lactulose  30 g Per Tube QID   multivitamin with minerals  1 tablet Per Tube Daily   phosphorus  500 mg Per Tube Q4H   rifaximin  550 mg Per Tube BID   thiamine  100 mg Per Tube Daily   acetaminophen **OR** acetaminophen, butalbital-acetaminophen-caffeine, ketorolac, mouth rinse  Assessment/ Plan:  Rachel Huber is a 67 y.o.  female with past medical condition including arthritis, anemia, GERD, hypertension, portal hypertension, cirrhosis, and acute kidney injury stage IIIa. who was admitted to [facility] on 07/28/2023 for Hepatic encephalopathy (HCC) [K76.82]   Acute kidney injury on chronic kidney disease stage III AA. Baseline creatinine appears to be 1.3 with GFR 46 on 06/30/2023. Acute kidney injury likely secondary to dehydration from poor oral intake. Cannot exclude any hypotensive events during surgical procedure.  Creatinine remains at baseline  Lab Results  Component Value Date   CREATININE 0.86 08/11/2023   CREATININE 1.01 (H) 08/10/2023   CREATININE 1.31 (H) 08/09/2023    Intake/Output Summary (Last 24  hours) at 08/11/2023 1201 Last data filed at 08/11/2023 0624 Gross per 24 hour  Intake 1977.19 ml  Output 1850 ml  Net 127.19 ml   2.  Hepatic encephalopathy, history of liver cirrhosis.  Ammonia peaked at 463.  Was 48 at last check on 08/08/2023.  Continue lactulose administration per primary team.   3. Anemia of chronic kidney disease Lab Results  Component  Value Date   HGB 7.3 (L) 08/10/2023    Will continue to monitor. Patient has received blood transfusions during this admission.   4. Hypernatremia. Likely due to Gi losses via NGT.  Sodium increased to 155, agree with increased rate of D5.   LOS: 14 Rachel Huber 12/24/202412:01 PM

## 2023-08-11 NOTE — Plan of Care (Signed)
  Problem: Clinical Measurements: Goal: Ability to maintain clinical measurements within normal limits will improve Outcome: Progressing Goal: Will remain free from infection Outcome: Progressing Goal: Diagnostic test results will improve Outcome: Progressing Goal: Respiratory complications will improve Outcome: Progressing Goal: Cardiovascular complication will be avoided Outcome: Progressing   Problem: Nutrition: Goal: Adequate nutrition will be maintained Outcome: Progressing   Problem: Elimination: Goal: Will not experience complications related to bowel motility Outcome: Progressing Goal: Will not experience complications related to urinary retention Outcome: Progressing   Problem: Pain Management: Goal: General experience of comfort will improve Outcome: Progressing   Problem: Safety: Goal: Ability to remain free from injury will improve Outcome: Progressing   Problem: Skin Integrity: Goal: Risk for impaired skin integrity will decrease Outcome: Progressing

## 2023-08-11 NOTE — Consult Note (Addendum)
PHARMACY CONSULT NOTE - ELECTROLYTES  Pharmacy Consult for Electrolyte Monitoring and Replacement   Recent Labs: Height: 5\' 9"  (175.3 cm) Weight: 92.2 kg (203 lb 4.2 oz) IBW/kg (Calculated) : 66.2 Estimated Creatinine Clearance: 78.9 mL/min (by C-G formula based on SCr of 0.86 mg/dL). Potassium (mmol/L)  Date Value  08/11/2023 3.6   Magnesium (mg/dL)  Date Value  95/62/1308 2.3   Calcium (mg/dL)  Date Value  65/78/4696 9.6   Albumin (g/dL)  Date Value  29/52/8413 2.5 (L)   Phosphorus (mg/dL)  Date Value  24/40/1027 2.4 (L)   Sodium (mmol/L)  Date Value  08/11/2023 155 (H)   Assessment  Rachel Huber is a 67 y.o. female presenting with hepatic encephalopathy. PMH significant for DM, CKD, NASH w/ cirrhosis, HLD, depression and anxiety . Pharmacy has been consulted to monitor and replace electrolytes.  Diet: Patient starting tube feeding MIVF: D5W @ 50 mL/hr Pertinent medications: N/A  Goal of Therapy: Electrolytes WNL  Plan:  K 3.6, Phos 2.4: Kphos 500mg  via tube x 2 doses K 155: continue D5@50ml /hr. Primary team to make adjustments to hypernatremia plan, pharmacy following peripherally. Check BMP, Mg, Phos ordered 2 times a day(as ordered by attending)  Thank you for allowing pharmacy to be a part of this patient's care.  Bettey Costa, PharmD Clinical Pharmacist 08/11/2023 7:50 AM

## 2023-08-11 NOTE — Progress Notes (Signed)
Nutrition Follow-up  DOCUMENTATION CODES:   Not applicable  INTERVENTION:   -TF via NGT:    Initiate Osmolite 1.5 @ 20 ml/hr and increase by 10 ml every 8 hours to goal rate of 50 ml/hr.    60 ml Prosource TF BID.     30 ml free water flush every 4 hours   If no IVFs, 150 ml free water flush every 4 hours   Tube feeding regimen provides 1960 kcal (100% of needs), 115 grams of protein, and 1094 ml of H2O.  Total free water: 1994 ml daily  -Continue MVI with minerals daily via tube -Continue 100 mg thiamine daily x 7 days via tube -Monitor Mg, K, and Phos and replete as needed secondary to high refeeding risk -Pharmacy following for electrolyte management and repletion ordered -Contacted Diabetes Coordinator for TF coverage recommendations secondary to persistent hyperglycemia  NUTRITION DIAGNOSIS:   Increased nutrient needs related to acute illness (hepatic encepahlopathy) as evidenced by estimated needs.  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  12/19- s/p fall, s/p Procedure(s): INTRAMEDULLARY (IM) NAIL INTERTROCHANTERIC (Left) 12/21- rectal tube in place, NGT placed (per KUB on 08/08/23- tip of tube in distal stomach) 12/23- TF initiated  Reviewed I/O's: -273 ml x 24 hours and  +3.3 L since admission  Rectal tube output: 2.3 L x 24 hours  Pt remains NPO. Per chart review, mental status has improved slightly, however, still lethargic overall. Pt receiving TF via NGT; per RN, Osmolite 1.5 advanced to 40 ml/hr on 0625 this AM.  Palliative care following for goals of care. Family wants to allow time for outcomes to see if she has improved as she has in the past.   Noted persistent hyperglycemia. Contacted diabetes coordinator for TF coverage recommendations.  Medications reviewed and include vitamin B-12, folic acid, lactulose, phosphorus, thiamine, and dextrose 5% solution @ 100 ml/hr.   Labs reviewed: Na: 155, K and Mg WDL, Phos: 2.4 (repleted by pharmacy), CBGS: 203-230 (inpatient orders for glycemic control are 0-15 units insulin aspart every 4 hours).    Diet Order:   Diet Order             Diet NPO time specified  Diet effective midnight                   EDUCATION NEEDS:   Not appropriate for education at this time  Skin:  Skin Assessment: Skin Integrity Issues: Skin Integrity Issues:: Stage I, Incisions Stage I: sacrum Incisions: closed lt hip  Last BM:  08/10/23 (900 ml via rectal tube)  Height:   Ht Readings from Last 1 Encounters:  07/29/23 5\' 9"  (1.753 m)    Weight:   Wt Readings from Last 1 Encounters:  08/11/23 92.2 kg    Ideal Body Weight:  65.9 kg  BMI:  Body mass index is 30.02 kg/m.  Estimated Nutritional Needs:   Kcal:  1950-2150  Protein:  100-115 grams  Fluid:  > 1.9 L    Levada Schilling, RD, LDN, CDCES Registered Dietitian III Certified Diabetes Care and Education Specialist If unable to reach this RD, please use "RD Inpatient" group chat on secure chat between hours of 8am-4 pm daily

## 2023-08-12 DIAGNOSIS — K7682 Hepatic encephalopathy: Secondary | ICD-10-CM | POA: Diagnosis not present

## 2023-08-12 DIAGNOSIS — N1832 Chronic kidney disease, stage 3b: Secondary | ICD-10-CM | POA: Diagnosis not present

## 2023-08-12 DIAGNOSIS — F418 Other specified anxiety disorders: Secondary | ICD-10-CM | POA: Diagnosis not present

## 2023-08-12 DIAGNOSIS — S72142S Displaced intertrochanteric fracture of left femur, sequela: Secondary | ICD-10-CM | POA: Diagnosis not present

## 2023-08-12 DIAGNOSIS — Z515 Encounter for palliative care: Secondary | ICD-10-CM | POA: Diagnosis not present

## 2023-08-12 DIAGNOSIS — K7469 Other cirrhosis of liver: Secondary | ICD-10-CM

## 2023-08-12 DIAGNOSIS — R4182 Altered mental status, unspecified: Secondary | ICD-10-CM | POA: Diagnosis not present

## 2023-08-12 LAB — BASIC METABOLIC PANEL
Anion gap: 8 (ref 5–15)
Anion gap: 4 — ABNORMAL LOW (ref 5–15)
BUN: 47 mg/dL — ABNORMAL HIGH (ref 8–23)
BUN: 48 mg/dL — ABNORMAL HIGH (ref 8–23)
CO2: 26 mmol/L (ref 22–32)
CO2: 29 mmol/L (ref 22–32)
Calcium: 9 mg/dL (ref 8.9–10.3)
Calcium: 8.8 mg/dL — ABNORMAL LOW (ref 8.9–10.3)
Chloride: 119 mmol/L — ABNORMAL HIGH (ref 98–111)
Chloride: 116 mmol/L — ABNORMAL HIGH (ref 98–111)
Creatinine, Ser: 0.95 mg/dL (ref 0.44–1.00)
Creatinine, Ser: 0.88 mg/dL (ref 0.44–1.00)
GFR, Estimated: >60 mL/min (ref >60)
GFR, Estimated: >60 mL/min (ref >60)
Glucose, Bld: 277 mg/dL — ABNORMAL HIGH (ref 70–99)
Glucose, Bld: 332 mg/dL — ABNORMAL HIGH (ref 70–99)
Potassium: 3.9 mmol/L (ref 3.5–5.1)
Potassium: 4 mmol/L (ref 3.5–5.1)
Sodium: 153 mmol/L — ABNORMAL HIGH (ref 135–145)
Sodium: 149 mmol/L — ABNORMAL HIGH (ref 135–145)

## 2023-08-12 LAB — GLUCOSE, CAPILLARY
Glucose-Capillary: 195 mg/dL — ABNORMAL HIGH (ref 70–99)
Glucose-Capillary: 225 mg/dL — ABNORMAL HIGH (ref 70–99)
Glucose-Capillary: 230 mg/dL — ABNORMAL HIGH (ref 70–99)
Glucose-Capillary: 236 mg/dL — ABNORMAL HIGH (ref 70–99)
Glucose-Capillary: 238 mg/dL — ABNORMAL HIGH (ref 70–99)
Glucose-Capillary: 276 mg/dL — ABNORMAL HIGH (ref 70–99)

## 2023-08-12 LAB — AMMONIA: Ammonia: 65 umol/L — ABNORMAL HIGH (ref 9–35)

## 2023-08-12 MED ORDER — ACETAMINOPHEN 650 MG RE SUPP: 325.0000 mg | Freq: Four times a day (QID) | RECTAL | Status: DC | PRN

## 2023-08-12 MED ORDER — ACETAMINOPHEN 325 MG PO TABS
650.0000 mg | ORAL_TABLET | Freq: Four times a day (QID) | ORAL | Status: DC | PRN
  Administered 2023-08-12: 650 mg
  Filled 2023-08-12: qty 2

## 2023-08-12 MED ORDER — FREE WATER
300.0000 mL | Status: DC
Start: 2023-08-12 — End: 2023-08-16
  Administered 2023-08-12 – 2023-08-16 (×23): 300 mL

## 2023-08-12 MED ORDER — INSULIN ASPART 100 UNIT/ML IJ SOLN
6.0000 [IU] | INTRAMUSCULAR | Status: DC
  Administered 2023-08-12 – 2023-08-17 (×31): 6 [IU] via SUBCUTANEOUS
  Filled 2023-08-12 (×31): qty 1

## 2023-08-12 MED ORDER — DEXTROSE 5 % IV SOLN: INTRAVENOUS | Status: DC

## 2023-08-12 NOTE — Progress Notes (Signed)
[facility]  ROUNDING NOTE   Subjective:   Sister at bedside today. Serum sodium down only slightly to 153. D5 was off this morning when we saw the patient. Creatinine currently 0.95.  Objective:  Vital signs in last 24 hours:  Temp:  [97.4 F (36.3 C)-100.1 F (37.8 C)] 98.6 F (37 C) (12/25 1526) Pulse Rate:  [77-104] 77 (12/25 1526) Resp:  [18-20] 18 (12/25 1526) BP: (118-150)/(52-61) 118/53 (12/25 1526) SpO2:  [90 %-100 %] 99 % (12/25 1526) Weight:  [92.4 kg] 92.4 kg (12/25 0500)  Weight change: 0.2 kg Filed Weights   07/28/23 1710 08/11/23 0449 08/12/23 0500  Weight: 135.2 kg 92.2 kg 92.4 kg    Intake/Output: I/O last 3 completed shifts: In: 3441.6 [I.V.:1773.3; NG/GT:1668.3] Out: 2000 [Stool:2000]   Intake/Output this shift:  Total I/O In: 1575.1 [I.V.:1575.1] Out: -   Physical Exam: General: Ill appearing  Head: Normocephalic, atraumatic. NGT  Eyes: Anicteric  Lungs:  Clear to auscultation, normal effort  Heart: Regular rate and rhythm  Abdomen:  Soft, nontender  Extremities: No peripheral edema.  Neurologic: Aroused to voice  Skin: No lesions       Basic Metabolic Panel: Recent Labs  Lab 08/09/23 0358 08/10/23 0500 08/10/23 1400 08/10/23 2041 08/11/23 0430 08/11/23 1826 08/12/23 0520  NA 145 152*  --   --  155* 154* 153*  K 3.2* 3.7  --   --  3.6 3.9 3.9  CL 114* 118*  --   --  124* 122* 119*  CO2 23 27  --   --  25 26 26   GLUCOSE 167* 115*  --   --  224* 259* 277*  BUN 44* 39*  --   --  38* 45* 47*  CREATININE 1.31* 1.01*  --   --  0.86 1.00 0.95  CALCIUM 9.5 9.6  --   --  9.6 9.4 9.0  MG 2.1 2.0 1.9 2.1 2.3 2.2  --   PHOS 2.0* 3.3 3.0 2.4* 2.4* 3.2  --     Liver Function Tests: Recent Labs  Lab 08/06/23 0835 08/07/23 0608 08/08/23 1459 08/09/23 0358 08/10/23 0500  AST 50* 36 42* 36 32  ALT 30 19 10 10 9   ALKPHOS 97 91 90 89 94  BILITOT 3.5* 1.9* 1.9* 2.1* 1.7*  PROT 6.2* 6.0* 5.6* 5.2* 5.2*  ALBUMIN 2.9*  2.8* 2.5* 2.5* 2.5*   No results for input(s): "LIPASE", "AMYLASE" in the last 168 hours. Recent Labs  Lab 08/08/23 0601 08/08/23 1459 08/12/23 0520  AMMONIA 463* 48* 65*    CBC: Recent Labs  Lab 08/06/23 0835 08/07/23 0608 08/08/23 0601 08/08/23 1459 08/09/23 0358 08/10/23 0500  WBC 8.0 13.4* 9.1  --  6.2 6.2  HGB 7.5* 6.4* 5.4* 6.5* 7.5* 7.3*  HCT 24.7* 21.1* 18.5*  --  22.6* 23.5*  MCV 73.3* 73.8* 75.2*  --  78.2* 80.5  PLT 164 210 177  --  135* 143*    Cardiac Enzymes: No results for input(s): "CKTOTAL", "CKMB", "CKMBINDEX", "TROPONINI" in the last 168 hours.  BNP: Invalid input(s): "POCBNP"  CBG: Recent Labs  Lab 08/11/23 1953 08/12/23 0047 08/12/23 0448 08/12/23 0833 08/12/23 1200  GLUCAP 231* 225* 236* 230* 238*    Microbiology: Results for orders placed or performed during the hospital encounter of 07/28/23  Culture, blood (Routine X 2) w Reflex to ID Panel     Status: None (Preliminary result)   Collection Time: 08/08/23  2:54 PM   Specimen: BLOOD  Result Value Ref Range Status   Specimen Description   Final    BLOOD BLOOD LEFT ARM Performed at [facility], [address], [place], [place] [address]    Special Requests   Final    BOTTLES DRAWN AEROBIC AND ANAEROBIC Blood Culture adequate volume Performed at [facility], [address] [place], [place] [address]    Culture   Final    NO GROWTH 4 DAYS Performed at [facility], [address], [place], [place] [address]    Report Status PENDING  Incomplete  Culture, blood (Routine X 2) w Reflex to ID Panel     Status: None (Preliminary result)   Collection Time: 08/08/23  2:59 PM   Specimen: BLOOD  Result Value Ref Range Status   Specimen Description   Final    BLOOD RIGHT ANTECUBITAL Performed at [facility], [address] [place], [place] [address]    Special Requests   Final    BOTTLES DRAWN AEROBIC AND ANAEROBIC Blood Culture adequate  volume Performed at [facility], [address], [place], [place] [address]    Culture   Final    NO GROWTH 4 DAYS Performed at [facility], [address], [place], [place] [address]    Report Status PENDING  Incomplete   *Note: Due to a large number of results and/or encounters for the requested time period, some results have not been displayed. A complete set of results can be found in Results Review.    Coagulation Studies: No results for input(s): "LABPROT", "INR" in the last 72 hours.   Urinalysis: No results for input(s): "COLORURINE", "LABSPEC", "PHURINE", "GLUCOSEU", "HGBUR", "BILIRUBINUR", "KETONESUR", "PROTEINUR", "UROBILINOGEN", "NITRITE", "LEUKOCYTESUR" in the last 72 hours.  Invalid input(s): "APPERANCEUR"    Imaging: No results found.    Medications:    dextrose 125 mL/hr at 08/12/23 1335   feeding supplement (OSMOLITE 1.5 CAL) 50 mL/hr at 08/11/23 1707    Chlorhexidine Gluconate Cloth  6 each Topical Daily   cyanocobalamin  1,000 mcg Per Tube Daily   feeding supplement (PROSource TF20)  60 mL Per Tube BID   folic acid  1 mg Per Tube Daily   free water  300 mL Per Tube Q4H   heparin  5,000 Units Subcutaneous Q8H   insulin aspart  0-15 Units Subcutaneous Q4H   insulin aspart  6 Units Subcutaneous Q4H   lactulose  300 mL Rectal QID   Or   lactulose  30 g Per Tube QID   metoprolol tartrate  12.5 mg Per NG tube BID   multivitamin with minerals  1 tablet Per Tube Daily   rifaximin  550 mg Per Tube BID   thiamine  100 mg Per Tube Daily   acetaminophen **OR** acetaminophen, butalbital-acetaminophen-caffeine, mouth rinse  Assessment/ Plan:  [name] is a 67 y.o.  female with past medical condition including arthritis, anemia, GERD, hypertension, portal hypertension, cirrhosis, and acute kidney injury stage IIIa. who was admitted to [facility] on 07/28/2023 for Hepatic encephalopathy (HCC) [K76.82]   Acute kidney injury on chronic  kidney disease stage IIIA. Baseline creatinine appears to be 1.3 with GFR 46 on 06/30/2023. Acute kidney injury likely secondary to dehydration from poor oral intake. Cannot exclude any hypotensive events during surgical procedure.  Creatinine relatively stable at 0.95 however BUN still a bit high at 47.  D5W to be restarted.  Lab Results  Component Value Date   CREATININE 0.95 08/12/2023  CREATININE 1.00 08/11/2023   CREATININE 0.86 08/11/2023    Intake/Output Summary (Last 24 hours) at 08/12/2023 1545 Last data filed at 08/12/2023 0903 Gross per 24 hour  Intake 3099.53 ml  Output 1000 ml  Net 2099.53 ml   2.  Hepatic encephalopathy, history of liver cirrhosis.  Ammonia peaked at 463.  Was 48 at last check on 08/08/2023.  Continue lactulose administration per primary team.   3. Anemia of chronic kidney disease Lab Results  Component Value Date   HGB 7.3 (L) 08/10/2023    No immediate need for transfusion however consider if hemoglobin reaches 7 or less.  4. Hypernatremia.  Serum sodium still high at 153.  D5W was off this a.m.  We will restart D5W at 125 cc/h and continue for at least the next 2 days.  Monitor serum sodium as well as renal parameters.   LOS: 15 Rachel Huber 12/25/20243:45 PM

## 2023-08-12 NOTE — Plan of Care (Signed)

## 2023-08-12 NOTE — Consult Note (Signed)
PHARMACY CONSULT NOTE - ELECTROLYTES  Pharmacy Consult for Electrolyte Monitoring and Replacement   Recent Labs: Height: 5\' 9"  (175.3 cm) Weight: 92.4 kg (203 lb 11.3 oz) IBW/kg (Calculated) : 66.2 Estimated Creatinine Clearance: 71.5 mL/min (by C-G formula based on SCr of 0.95 mg/dL). Potassium (mmol/L)  Date Value  08/12/2023 3.9   Magnesium (mg/dL)  Date Value  52/84/1324 2.2   Calcium (mg/dL)  Date Value  40/05/2724 9.0   Albumin (g/dL)  Date Value  36/64/4034 2.5 (L)   Phosphorus (mg/dL)  Date Value  74/25/9563 3.2   Sodium (mmol/L)  Date Value  08/12/2023 153 (H)   Assessment  Rachel Huber is a 67 y.o. female presenting with hepatic encephalopathy. PMH significant for DM, CKD, NASH w/ cirrhosis, HLD, depression and anxiety . Pharmacy has been consulted to monitor and replace electrolytes.  Diet: Patient starting tube feeding MIVF:  Pertinent medications:   free water per tube q4h  Goal of Therapy: Electrolytes WNL  Plan:  No electrolyte replacement at this time Check BMP, Mg, Phos ordered 2 times a day(as ordered by attending)  Thank you for allowing pharmacy to be a part of this patient's care.  Angelique Blonder, PharmD Clinical Pharmacist 08/12/2023 10:04 AM

## 2023-08-12 NOTE — Progress Notes (Signed)
Daily Progress Note   Patient Name: Rachel Huber       Date: 08/12/2023 DOB: 1956-07-12  Age: 67 y.o. 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    12/12: Patient has been excepting her lactulose and having multiple bowel movements.  Mental status remains poor 12/13: No clinical status changes.  Patient is a little bit more awake.  Has been taking her p.o. lactulose. 12/15: Mental status seems close to baseline.  Waiting for PT and OT eval 12/16: PT and OT recommends SNF.  Husband refusing to take her back to [facility].  TOC working on alternative placement 12/17: Patient refusing lactulose at times.  Ammonia trending up 12/18: Lactulose enema was given as an option yesterday for oral lactulose when she is not able to take.  Ammonia 94 12/19: Patient reportedly fell overnight/early morning and has new left intertrochanteric hip fracture.  Going for surgery/OR today 12/20 s/p left hip cephalmomedullary nail 12/22 GI has been consulted-recommendations for NGT placement to ensure administration of lactulose, liver US assessing for ascites for potential paracentesis to assess for SBP--recommendations to assess for other sources of encephalopthy   Palliative Medicine consulted for assisting with goals of care  conversations.  Subjective:   Subjective: Chart Reviewed. Updates received. Patient Assessed. Created space and opportunity for patient  and family to explore thoughts and feelings regarding current medical situation.  Today's Discussion: Today I saw the patient at the bedside. I attempted to elicit a response via verbal and mild to moderate tactile stimuli, no attempt to elicit response to pain.  Patient opened her eyes and made eye contact, quickly closes her eyes again.  She is unable to meaningfully communicate.  She does not appear to be uncomfortable or in distress.  I provided emotional and general support through therapeutic touch and reassurance.  I answered all questions and addressed all concerns to the best of my ability.  Review of Systems  Unable to perform ROS: Mental status change    Objective:   Vital Signs:  BP (!) 128/52 (BP Location: Left Arm)   Pulse 91   Temp (!) 97.4 F (36.3 C)   Resp 20   Ht 5\' 9"  (1.753 m)   Wt 92.4 kg   SpO2 100%   BMI 30.08 kg/m   Physical Exam Vitals and nursing note reviewed.  Constitutional:      General: She is sleeping. She is not in acute distress.    Comments: Opens eyes to verbal and tactile stimuli, made eye contact, noncommunicative  HENT:     Head: Normocephalic and atraumatic.  Cardiovascular:     Rate and Rhythm: Normal rate.  Pulmonary:     Effort: Pulmonary effort is normal. No respiratory distress.  Abdominal:     General: Abdomen is flat.     Palpations: Abdomen is soft.     Comments: Noted NG tube in place  Skin:    General:  Skin is warm and dry.  Neurological:     Mental Status: She is unresponsive.     Palliative Assessment/Data: 10% currently    Existing Vynca/ACP Documentation: None  Assessment & Plan:   Impression: Present on Admission:  Hepatic encephalopathy (HCC)  AMS (altered mental status)  Anemia in chronic kidney disease (CKD)  Aortic atherosclerosis (HCC)  Beta thalassemia  intermedia (HCC)  Chronic kidney disease, stage 3b (HCC)  Depression with anxiety  HLD (hyperlipidemia)  Type 2 diabetes mellitus with hyperlipidemia (HCC)  Hypokalemia  SUMMARY OF RECOMMENDATIONS   Continue full code Full scope of care Family hopeful for improvement based on past experience Ongoing GOC conversations pending progression of clinical course Palliative medicine will follow-up tomorrow to see patient and engaged with family  Symptom Management:  Per primary team PMT is available to assist as needed  Code Status: Full code  Prognosis: Unable to determine  Discharge Planning: To Be Determined  Discussed with: Medical team, nursing team  Thank you for allowing [place] to participate in the care of Don Broach PMT will continue to support holistically.  Time Total: 30 min  Detailed review of medical records (labs, imaging, vital signs), medically appropriate exam, discussed with treatment team, counseling and education to patient, family, & staff, documenting clinical information, medication management, coordination of care  Wynne Dust, NP Palliative Medicine Team  Team Phone # [phone] (Nights/Weekends)  04/16/2021, 8:17 AM

## 2023-08-12 NOTE — Progress Notes (Signed)
Progress Note   Patient: Rachel Huber UJW:[id number] DOB: 09-Jul-1956 DOA: 07/28/2023     15 DOS: the patient was seen and examined on 08/12/2023   Brief hospital course: 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  Patient was diagnosed with hepatic encephalopathy, was given lactulose and IV fluids.  Patient mental status has been waxing and waning.  Patient had a fall while in the hospital, sustained a new left intertrochanteric hip fracture, had intertrochanteric nail performed on 12/19. 12/21.  Patient became unresponsive, ammonia level went up to more than 400, NG tube was placed, patient was given lactulose.  Received 2 units of PRBC. 12/22.  Patient became more responsive today, still very confused. 12/23: Patient remained quite lethargic, not following any commands and appears significantly dry with sodium of 152-started on D5 with hypotonic saline.  High risk for refeeding syndrome so close monitoring of electrolytes required.  Hemoglobin at 7.3, anemia panel consistent with anemia of chronic disease.  Having bowel movements.  High risk for deterioration and mortality-palliative care is on board but currently patient remained full code.  12/24: Patient remained pretty much unresponsive, minimum response to painful stimuli, worsening hypernatremia and hyperglycemia since she was started on tube feed.  Increasing free water, D5 and adding SSI. Family remain very optimistic for her full recovery and want everything to be  done.  12/25: No change in clinical status.  Slight improvement in sodium to 153, CBG elevated, continuing D5 and increasing the dose of NovoLog to 6 unit, although increasing free water intake through NG tube.   Principal Problem:   Hepatic encephalopathy (HCC) Active Problems:   Chronic kidney disease, stage 3b (HCC)   HLD (hyperlipidemia)   Depression with anxiety   Type 2 diabetes mellitus with hyperlipidemia (HCC)   OSA on CPAP   Anemia in chronic kidney disease (CKD)   Hypokalemia   AMS (altered mental status)   Aortic atherosclerosis (HCC)   Beta thalassemia intermedia (HCC)   Obesity, Class III, BMI 40-49.9 (morbid obesity) (HCC)   Hypomagnesemia   Intertrochanteric fracture of left hip, sequela   Assessment and Plan: Hepatic encephalopathy (HCC) Decompensated NASH cirrhosis with ascites. Suspect secondary to patient refusal to take lactulose at nursing facility Patient has been treated with lactulose, ammonia level seem to be improving.  Peaked at 643.  However, patient has a worsening mental status 12/21, she became unresponsive. Due to altered mental status, patient has not been able to take lactulose  GI consult was obtained, lactulose was given after NG tube was placed.  So far blood culture has been negative,  no need for additional prophylactic treatment with Rocephin. Due to altered mental status, not safe for oral intake, started on tube feed. Her MRI of the brain in September 2024 showed - 2.2 x 1.5 cm heavily calcified meningioma in the right middle cranial fossa with some mass effect on the lateral aspect of the right temporal tip. MRI brain is unchanged from before Patient long-term prognosis is very poor, palliative care is following. -Continue with supportive care  Hypernatremia.  Small improvement in sodium to  153  free water deficit of 4.3 L. -Continue with D5 -Increase free water through NG tube -Monitor sodium   Anemia in chronic kidney disease  (CKD)   Patient received 2 units of PRBC, hemoglobin went up to 7.5.  Continue to follow.  No concern of GI bleed. Hemoglobin 7.3 today, anemia panel consistent with anemia of chronic disease -Continue to monitor and transfuse below 7   Acute on chronic kidney disease, stage 3b (HCC) Mild metabolic acidosis. Hypokalemia. Hypomagnesemia. Hypophosphatemia Renal function and potassium improved today,     Acute left intertrochanteric hip fracture Status post fall on 12/19 around 4 AM.  Per report she was trying to get out of the bed by herself to go to the bathroom.   Underwent head CT which was negative for acute findings. Left hip x-ray showed acute fracture -status post left hip cephalomedullary nail in place orthopedics on 12/19 Pain medicine as needed as ordered Aspirin for DVT prophylaxis Weightbearing as tolerated on the left hip per orthopedic.   Obesity class I. Estimated body mass index is 30.08 kg/m as calculated from the following:   Height as of this encounter: 5\' 9"  (1.753 m).   Weight as of this encounter: 92.4 kg.  Complicating factor to overall care and prognosis Continue CPAP while asleep.      Subjective:  Patient remained quite encephalopathic, minimum response to painful stimuli.  Physical Exam: Vitals:   08/12/23 0440 08/12/23 0500 08/12/23 0831 08/12/23 1526  BP: (!) 136/53  (!) 128/52 (!) 118/53  Pulse: 92  91 77  Resp: 18  20 18   Temp: 100.1 F (37.8 C)  (!) 97.4 F (36.3 C) 98.6 F (37 C)  TempSrc: Oral     SpO2: 99%  100% 99%  Weight:  92.4 kg    Height:       General.  Chronically ill-appearing lady, in no acute distress. Pulmonary.  Lungs clear bilaterally, normal respiratory effort. CV.  Regular rate and rhythm, no JVD, rub or murmur. Abdomen.  Soft, nontender, nondistended, BS positive. CNS.  No responding to any command, minimal response to painful stimuli. Extremities.  No edema, no cyanosis, pulses intact and symmetrical.  Data  Reviewed:  Lab results reviewed.  Family Communication: Sister at bedside  Disposition: Status is: Inpatient Remains inpatient appropriate because: Severity of disease, IV treatment.  DVT Phrophylaxis. heparin Time spent: 50 minutes  This record has been created using Conservation officer, historic buildings. Errors have been sought and corrected,but may not always be located. Such creation errors do not reflect on the standard of care.   Author: Arnetha Courser, MD 08/12/2023 3:44 PM  For on call review [email or web address].

## 2023-08-13 DIAGNOSIS — Z515 Encounter for palliative care: Secondary | ICD-10-CM | POA: Diagnosis not present

## 2023-08-13 DIAGNOSIS — F418 Other specified anxiety disorders: Secondary | ICD-10-CM | POA: Diagnosis not present

## 2023-08-13 DIAGNOSIS — N1832 Chronic kidney disease, stage 3b: Secondary | ICD-10-CM | POA: Diagnosis not present

## 2023-08-13 DIAGNOSIS — Z7189 Other specified counseling: Secondary | ICD-10-CM | POA: Diagnosis not present

## 2023-08-13 DIAGNOSIS — K7682 Hepatic encephalopathy: Secondary | ICD-10-CM | POA: Diagnosis not present

## 2023-08-13 DIAGNOSIS — R4182 Altered mental status, unspecified: Secondary | ICD-10-CM | POA: Diagnosis not present

## 2023-08-13 LAB — BASIC METABOLIC PANEL
Anion gap: 4 — ABNORMAL LOW (ref 5–15)
BUN: 46 mg/dL — ABNORMAL HIGH (ref 8–23)
CO2: 27 mmol/L (ref 22–32)
Calcium: 9 mg/dL (ref 8.9–10.3)
Chloride: 111 mmol/L (ref 98–111)
Creatinine, Ser: 0.79 mg/dL (ref 0.44–1.00)
GFR, Estimated: >60 mL/min (ref >60)
Glucose, Bld: 271 mg/dL — ABNORMAL HIGH (ref 70–99)
Potassium: 3.9 mmol/L (ref 3.5–5.1)
Sodium: 142 mmol/L (ref 135–145)

## 2023-08-13 LAB — GLUCOSE, CAPILLARY
Glucose-Capillary: 190 mg/dL — ABNORMAL HIGH (ref 70–99)
Glucose-Capillary: 195 mg/dL — ABNORMAL HIGH (ref 70–99)
Glucose-Capillary: 204 mg/dL — ABNORMAL HIGH (ref 70–99)
Glucose-Capillary: 222 mg/dL — ABNORMAL HIGH (ref 70–99)
Glucose-Capillary: 235 mg/dL — ABNORMAL HIGH (ref 70–99)
Glucose-Capillary: 254 mg/dL — ABNORMAL HIGH (ref 70–99)
Glucose-Capillary: 269 mg/dL — ABNORMAL HIGH (ref 70–99)

## 2023-08-13 LAB — PHOSPHORUS: Phosphorus: 2.6 mg/dL (ref 2.5–4.6)

## 2023-08-13 LAB — CULTURE, BLOOD (ROUTINE X 2)
Culture: NO GROWTH
Culture: NO GROWTH
Special Requests: ADEQUATE
Special Requests: ADEQUATE

## 2023-08-13 LAB — CBC
HCT: 22.5 % — ABNORMAL LOW (ref 36.0–46.0)
Hemoglobin: 6.7 g/dL — ABNORMAL LOW (ref 12.0–15.0)
MCH: 25.7 pg — ABNORMAL LOW (ref 26.0–34.0)
MCHC: 29.8 g/dL — ABNORMAL LOW (ref 30.0–36.0)
MCV: 86.2 fL (ref 80.0–100.0)
Platelets: 82 10*3/uL — ABNORMAL LOW (ref 150–400)
RBC: 2.61 MIL/uL — ABNORMAL LOW (ref 3.87–5.11)
RDW: 20.8 % — ABNORMAL HIGH (ref 11.5–15.5)
WBC: 7.9 10*3/uL (ref 4.0–10.5)
nRBC: 1.1 % — ABNORMAL HIGH (ref 0.0–0.2)

## 2023-08-13 LAB — MAGNESIUM: Magnesium: 2.2 mg/dL (ref 1.7–2.4)

## 2023-08-13 LAB — HEMOGLOBIN AND HEMATOCRIT, BLOOD
HCT: 25.5 % — ABNORMAL LOW (ref 36.0–46.0)
Hemoglobin: 7.7 g/dL — ABNORMAL LOW (ref 12.0–15.0)

## 2023-08-13 LAB — PREPARE RBC (CROSSMATCH)

## 2023-08-13 MED ORDER — SODIUM CHLORIDE 0.9% IV SOLUTION: Freq: Once | INTRAVENOUS | Status: AC

## 2023-08-13 NOTE — Progress Notes (Signed)
PT Cancellation Note  Patient Details Name: Rachel Huber MRN: [id number] DOB: 11-Nov-1955   Cancelled Treatment:    Reason Eval/Treat Not Completed: Fatigue/lethargy limiting ability to participate;Patient's level of consciousness. Patient is not able to actively participate in PT at this time. Too lethargic. Opens eyes briefly. Does not follow direction. Signing off at this time. If patient improves, feel free to re-consult.    Aylyn Wenzler 08/13/2023, 9:50 AM

## 2023-08-13 NOTE — Progress Notes (Signed)
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  Patient was diagnosed with hepatic encephalopathy, was given lactulose and IV fluids.  Patient mental status has been waxing and waning.  Patient had a fall while in the hospital, sustained a new left intertrochanteric hip fracture, had intertrochanteric nail performed on 12/19. 12/21.  Patient became unresponsive, ammonia level went up to more than 400, NG tube was placed, patient was given lactulose.  Received 2 units of PRBC. 12/22.  Patient became more responsive today, still very confused. 12/23: Patient remained quite lethargic, not following any commands and appears significantly dry with sodium of 152-started on D5 with hypotonic saline.  High risk for refeeding syndrome so close monitoring of electrolytes required.  Hemoglobin at 7.3, anemia panel consistent with anemia of chronic disease.  Having bowel movements.  High risk for deterioration and mortality-palliative care is on board but currently patient remained full code.  12/24: Patient remained pretty much unresponsive, minimum response to painful stimuli, worsening hypernatremia and hyperglycemia since she was started on tube feed.  Increasing free water, D5 and adding SSI. Family remain very optimistic for her full recovery and want everything to be  done.  12/25: No change in clinical status.  Slight improvement in sodium to 153, CBG elevated, continuing D5 and increasing the dose of NovoLog to 6 unit, although increasing free water intake through NG tube.  12/26; patient open eyes and make a very short eye contact when called her name today.  Not following any other commands.  Hypernatremia resolved.  Stopping IV fluid and we will continue with free water during tube feed. Hemoglobin decreased to 6.7 so ordered 1 unit of PRBC.  Worsening thrombocytopenia.  CBG remained elevated but now we are stopping D5 hopefully that will improve.   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 Suspect secondary to patient refusal to take lactulose at nursing facility Patient has been treated with lactulose, ammonia level seem to be improving.  Peaked at 643.  However, patient has a worsening mental status 12/21, she became unresponsive. Due to altered mental status, patient has not been able to take lactulose  GI consult was obtained, lactulose was given after NG tube was placed.  So far blood culture has been negative,  no need for additional prophylactic treatment with Rocephin. Due to altered mental status, not safe for oral intake, started on tube feed. Her MRI of the brain in September 2024 showed - 2.2 x 1.5 cm heavily calcified meningioma in the right middle cranial fossa with some mass effect on the lateral aspect of the right temporal tip. MRI brain is unchanged from before Patient long-term prognosis is very poor, palliative care is following. -Continue with supportive care  Hypernatremia.  Improved with sodium of 142 today Stopping D5 -Continue with free water through NG tube -Monitor sodium   Anemia in chronic kidney disease (CKD)   Patient received  2 units of PRBC, hemoglobin went up to 7.5.  No concern of GI bleed. anemia panel consistent with anemia of chronic disease. Hemoglobin  decreased to 6.7 today-ordered 1 more unit of PRBC. -Continue to monitor and transfuse below 7   Acute on chronic kidney disease, stage 3b (HCC) Mild metabolic acidosis. Hypokalemia. Hypomagnesemia. Hypophosphatemia Renal function and potassium improved today,     Acute left intertrochanteric hip fracture Status post fall on 12/19 around 4 AM.  Per report she was trying to get out of the bed by herself to go to the bathroom.   Underwent head CT which was negative for acute findings. Left hip x-ray showed acute fracture -status post left hip cephalomedullary nail in place orthopedics on 12/19 Pain medicine as needed as ordered Aspirin for DVT prophylaxis Weightbearing as tolerated on the left hip per orthopedic.   Obesity class I. Estimated body mass index is 29.69 kg/m as calculated from the following:   Height as of this encounter: 5\' 9"  (1.753 m).   Weight as of this encounter: 91.2 kg.  Complicating factor to overall care and prognosis Continue CPAP while asleep.      Subjective:  Patient remained lethargic but open eyes and make a very short eye contact when called her name, not following any other commands  Physical Exam: Vitals:   08/13/23 0500 08/13/23 0834 08/13/23 1319 08/13/23 1334  BP:  (!) 131/45 116/60 116/60  Pulse:  82 79 77  Resp:   18 19  Temp:   99.1 F (37.3 C) 98.4 F (36.9 C)  TempSrc:      SpO2:  99% 100% 100%  Weight: 91.2 kg     Height:       General.  Chronically ill-appearing lady, in no acute distress. Pulmonary.  Lungs clear bilaterally, normal respiratory effort. CV.  Regular rate and rhythm, no JVD, rub or murmur. Abdomen.  Soft, nontender, nondistended, BS positive. CNS.  Open eyes momentarily when called her name today, not following any other commands.  No apparent focal deficit Extremities.  No edema, no cyanosis, pulses intact and symmetrical.  Data Reviewed:  Lab results reviewed.  Family Communication: Talked with husband  on phone.  Disposition: Status is: Inpatient Remains inpatient appropriate because: Severity of disease, IV treatment.  DVT Phrophylaxis. heparin Time spent: 50 minutes  This record has been created using Conservation officer, historic buildings. Errors have been sought and corrected,but may not always be located. Such creation errors do not reflect on the standard of care.   Author: Arnetha Courser, MD 08/13/2023 2:48 PM  For on call review [email or web address].

## 2023-08-13 NOTE — Consult Note (Signed)
PHARMACY CONSULT NOTE - ELECTROLYTES  Pharmacy Consult for Electrolyte Monitoring and Replacement   Recent Labs: Height: 5\' 9"  (175.3 cm) Weight: 91.2 kg (201 lb 1 oz) IBW/kg (Calculated) : 66.2 Estimated Creatinine Clearance: 84.3 mL/min (by C-G formula based on SCr of 0.79 mg/dL). Potassium (mmol/L)  Date Value  08/13/2023 3.9   Magnesium (mg/dL)  Date Value  16/05/9603 2.2   Calcium (mg/dL)  Date Value  54/04/8118 9.0   Albumin (g/dL)  Date Value  14/78/2956 2.5 (L)   Phosphorus (mg/dL)  Date Value  21/30/8657 2.6   Sodium (mmol/L)  Date Value  08/13/2023 142   Assessment  Rachel Huber is a 67 y.o. female presenting with hepatic encephalopathy. PMH significant for DM, CKD, NASH w/ cirrhosis, HLD, depression and anxiety . Pharmacy has been consulted to monitor and replace electrolytes.  Diet: Tube feeds at 50 cc/hr + free water 300 cc q4h (3L/day) Misc: Lactulose 30 g 4x/day  Goal of Therapy: Electrolytes WNL  Plan:  No electrolyte replacement at this time Follow-up electrolytes with AM labs tomorrow  Thank you for allowing pharmacy to be a part of this patient's care.  Tressie Ellis 08/13/2023 9:03 AM

## 2023-08-13 NOTE — Progress Notes (Signed)
Nutrition Follow-up  DOCUMENTATION CODES:   Not applicable  INTERVENTION:   -TF via NGT:    Osmolite 1.5 @ 50 ml/hr    60 ml Prosource TF BID.     300 ml free water flush every 4 hours per MD  Tube feeding regimen provides 1960 kcal (100% of needs), 115 grams of protein, and 1094 ml of H2O.  Total free water: 2894 ml daily   -Continue MVI with minerals daily via tube -Continue 100 mg thiamine daily x 7 days via tube -Monitor Mg, K, and Phos and replete as needed secondary to high refeeding risk -Pharmacy following for electrolyte management and repletion ordered  NUTRITION DIAGNOSIS:   Increased nutrient needs related to acute illness (hepatic encepahlopathy) as evidenced by estimated needs.  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  12/19- s/p fall, s/p Procedure(s): INTRAMEDULLARY (IM) NAIL INTERTROCHANTERIC (Left) 12/21- rectal tube in place, NGT placed (per KUB on 08/08/23- tip of tube in distal stomach) 12/23- TF initiated  Reviewed I/O's: +1.6 L x 24 hours and +5 L since 07/30/23   Pt remains minimally responsive. Per PT notes, pt will open eyes, but does not follow commands.   Pt NPO and continues to receive sole source nutrition via NGT, which is infusing at goal rate of  50 ml/hr.   Dextrose d/c today and free water flushes increased by MD.   Palliative care following for goals of care. Family wants to allow time for outcomes to see if she has improved as she has in the past.   Medications reviewed and include vitamin B-12, lactulose, xifaxin, and thiamine.   Labs reviewed: Na, K, Mg, and Phos WDL. CBGS: 269 (inpatient orders for glycemic control are 0-15 units insulin aspart every 4 hours and 6 units insulin aspart every 4 hours).    Diet Order:   Diet Order             Diet NPO time specified  Diet effective midnight                   EDUCATION NEEDS:   Not appropriate for education at this time  Skin:  Skin Assessment: Skin Integrity Issues: Skin Integrity Issues:: Stage I, Incisions Stage I: sacrum Incisions: closed lt hip  Last BM:  08/12/23 (type 7 via rectal tube)  Height:   Ht Readings from Last 1 Encounters:  07/29/23 5\' 9"  (1.753 m)    Weight:   Wt Readings from Last 1 Encounters:  08/13/23 91.2 kg    Ideal Body Weight:  65.9 kg  BMI:  Body mass index is 29.69 kg/m.  Estimated Nutritional Needs:   Kcal:  1950-2150  Protein:  100-115 grams  Fluid:  > 1.9 L    Levada Schilling, RD, LDN, CDCES Registered Dietitian III Certified Diabetes Care and Education Specialist If unable to reach this  RD, please use "RD Inpatient" group chat on secure chat between hours of 8am-4 pm daily

## 2023-08-13 NOTE — Progress Notes (Signed)
[facility]  ROUNDING NOTE   Subjective:   Patient seen resting quietly More alert today Daughter at bedside  Sodium 142  Objective:  Vital signs in last 24 hours:  Temp:  [98 F (36.7 C)-99.1 F (37.3 C)] 98.4 F (36.9 C) (12/26 1555) Pulse Rate:  [68-82] 79 (12/26 1555) Resp:  [18-20] 19 (12/26 1555) BP: (115-133)/(45-62) 115/62 (12/26 1555) SpO2:  [99 %-100 %] 99 % (12/26 1555) Weight:  [91.2 kg] 91.2 kg (12/26 0500)  Weight change: -1.2 kg Filed Weights   08/11/23 0449 08/12/23 0500 08/13/23 0500  Weight: 92.2 kg 92.4 kg 91.2 kg    Intake/Output: I/O last 3 completed shifts: In: 1575.1 [I.V.:1575.1] Out: 1000 [Stool:1000]   Intake/Output this shift:  Total I/O In: 10.8 [I.V.:10.8] Out: -   Physical Exam: General: Ill appearing  Head: Normocephalic, atraumatic. NGT  Eyes: Anicteric  Lungs:  Clear to auscultation, normal effort  Heart: Regular rate and rhythm  Abdomen:  Soft, nontender  Extremities: No peripheral edema.  Neurologic: Aroused to voice  Skin: No lesions       Basic Metabolic Panel: Recent Labs  Lab 08/10/23 1400 08/10/23 2041 08/11/23 0430 08/11/23 1826 08/12/23 0520 08/12/23 1627 08/13/23 0602  NA  --   --  155* 154* 153* 149* 142  K  --   --  3.6 3.9 3.9 4.0 3.9  CL  --   --  124* 122* 119* 116* 111  CO2  --   --  25 26 26 29 27   GLUCOSE  --   --  224* 259* 277* 332* 271*  BUN  --   --  38* 45* 47* 48* 46*  CREATININE  --   --  0.86 1.00 0.95 0.88 0.79  CALCIUM  --   --  9.6 9.4 9.0 8.8* 9.0  MG 1.9 2.1 2.3 2.2  --   --  2.2  PHOS 3.0 2.4* 2.4* 3.2  --   --  2.6    Liver Function Tests: Recent Labs  Lab 08/07/23 0608 08/08/23 1459 08/09/23 0358 08/10/23 0500  AST 36 42* 36 32  ALT 19 10 10 9   ALKPHOS 91 90 89 94  BILITOT 1.9* 1.9* 2.1* 1.7*  PROT 6.0* 5.6* 5.2* 5.2*  ALBUMIN 2.8* 2.5* 2.5* 2.5*   No results for input(s): "LIPASE", "AMYLASE" in the last 168 hours. Recent Labs  Lab 08/08/23 0601  08/08/23 1459 08/12/23 0520  AMMONIA 463* 48* 65*    CBC: Recent Labs  Lab 08/07/23 0608 08/08/23 0601 08/08/23 1459 08/09/23 0358 08/10/23 0500 08/13/23 0602  WBC 13.4* 9.1  --  6.2 6.2 7.9  HGB 6.4* 5.4* 6.5* 7.5* 7.3* 6.7*  HCT 21.1* 18.5*  --  22.6* 23.5* 22.5*  MCV 73.8* 75.2*  --  78.2* 80.5 86.2  PLT 210 177  --  135* 143* 82*    Cardiac Enzymes: No results for input(s): "CKTOTAL", "CKMB", "CKMBINDEX", "TROPONINI" in the last 168 hours.  BNP: Invalid input(s): "POCBNP"  CBG: Recent Labs  Lab 08/12/23 2022 08/13/23 0011 08/13/23 0439 08/13/23 0833 08/13/23 1204  GLUCAP 195* 204* 235* 269* 254*    Microbiology: Results for orders placed or performed during the hospital encounter of 07/28/23  Culture, blood (Routine X 2) w Reflex to ID Panel     Status: None   Collection Time: 08/08/23  2:54 PM   Specimen: BLOOD  Result Value Ref Range Status   Specimen Description BLOOD BLOOD LEFT ARM  Final  Special Requests   Final    BOTTLES DRAWN AEROBIC AND ANAEROBIC Blood Culture adequate volume   Culture   Final    NO GROWTH 5 DAYS Performed at [facility], [address] [place], [place] [address]    Report Status 08/13/2023 FINAL  Final  Culture, blood (Routine X 2) w Reflex to ID Panel     Status: None   Collection Time: 08/08/23  2:59 PM   Specimen: BLOOD  Result Value Ref Range Status   Specimen Description BLOOD RIGHT ANTECUBITAL  Final   Special Requests   Final    BOTTLES DRAWN AEROBIC AND ANAEROBIC Blood Culture adequate volume   Culture   Final    NO GROWTH 5 DAYS Performed at [facility], [address], [place], [place] [address]    Report Status 08/13/2023 FINAL  Final   *Note: Due to a large number of results and/or encounters for the requested time period, some results have not been displayed. A complete set of results can be found in Results Review.    Coagulation Studies: No results for input(s): "LABPROT",  "INR" in the last 72 hours.   Urinalysis: No results for input(s): "COLORURINE", "LABSPEC", "PHURINE", "GLUCOSEU", "HGBUR", "BILIRUBINUR", "KETONESUR", "PROTEINUR", "UROBILINOGEN", "NITRITE", "LEUKOCYTESUR" in the last 72 hours.  Invalid input(s): "APPERANCEUR"    Imaging: No results found.    Medications:    feeding supplement (OSMOLITE 1.5 CAL) 1,000 mL (08/12/23 2035)    Chlorhexidine Gluconate Cloth  6 each Topical Daily   cyanocobalamin  1,000 mcg Per Tube Daily   feeding supplement (PROSource TF20)  60 mL Per Tube BID   folic acid  1 mg Per Tube Daily   free water  300 mL Per Tube Q4H   heparin  5,000 Units Subcutaneous Q8H   insulin aspart  0-15 Units Subcutaneous Q4H   insulin aspart  6 Units Subcutaneous Q4H   lactulose  300 mL Rectal QID   Or   lactulose  30 g Per Tube QID   metoprolol tartrate  12.5 mg Per NG tube BID   multivitamin with minerals  1 tablet Per Tube Daily   rifaximin  550 mg Per Tube BID   thiamine  100 mg Per Tube Daily   acetaminophen **OR** acetaminophen, butalbital-acetaminophen-caffeine, mouth rinse  Assessment/ Plan:  Rachel Huber is a 67 y.o.  female with past medical condition including arthritis, anemia, GERD, hypertension, portal hypertension, cirrhosis, and acute kidney injury stage IIIa. who was admitted to [facility] on 07/28/2023 for Hepatic encephalopathy (HCC) [K76.82]   Acute kidney injury on chronic kidney disease stage IIIA. Baseline creatinine appears to be 1.3 with GFR 46 on 06/30/2023. Acute kidney injury likely secondary to dehydration from poor oral intake. Cannot exclude any hypotensive events during surgical procedure.  Creatinine remains improved beyond stated baseline. BUN stable.   Lab Results  Component Value Date   CREATININE 0.79 08/13/2023   CREATININE 0.88 08/12/2023   CREATININE 0.95 08/12/2023    Intake/Output Summary (Last 24 hours) at 08/13/2023 1722 Last data filed at 08/13/2023 1537 Gross per 24  hour  Intake 10.83 ml  Output --  Net 10.83 ml   2.  Hepatic encephalopathy, history of liver cirrhosis.  Ammonia peaked at 463.  Was 48 at last check on 08/08/2023.  Continue lactulose administration per primary team.   3. Anemia of chronic kidney disease Lab Results  Component Value Date   HGB 6.7 (L) 08/13/2023    No immediate  need for transfusion however consider if hemoglobin reaches 7 or less. Primary team has ordered 1 unit blood transfusion.   4. Hypernatremia.  Serum sodium corrected to 142.   Due to renal recovery and sodium correction, we will sign off at this time.    LOS: 16 Morrell Fluke 12/26/20245:22 PM

## 2023-08-13 NOTE — Progress Notes (Signed)
Daily Progress Note   Patient Name: Rachel Huber       Date: 08/13/2023 DOB: 07-15-1956  Age: 67 y.o. 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    12/12: Patient has been excepting her lactulose and having multiple bowel movements.  Mental status remains poor 12/13: No clinical status changes.  Patient is a little bit more awake.  Has been taking her p.o. lactulose. 12/15: Mental status seems close to baseline.  Waiting for PT and OT eval 12/16: PT and OT recommends SNF.  Husband refusing to take her back to [facility].  TOC working on alternative placement 12/17: Patient refusing lactulose at times.  Ammonia trending up 12/18: Lactulose enema was given as an option yesterday for oral lactulose when she is not able to take.  Ammonia 94 12/19: Patient reportedly fell overnight/early morning and has new left intertrochanteric hip fracture.  Going for surgery/OR today 12/20 s/p left hip cephalmomedullary nail 12/22 GI has been consulted-recommendations for NGT placement to ensure administration of lactulose, liver US assessing for ascites for potential paracentesis to assess for SBP--recommendations to assess for other sources of encephalopthy   Palliative Medicine consulted for assisting with goals of care  conversations.  Subjective:   Subjective: Chart Reviewed. Updates received. Patient Assessed. Created space and opportunity for patient  and family to explore thoughts and feelings regarding current medical situation.  Today's Discussion: Today I saw the patient at the bedside.  Possibly some very minimal improvement.  When I called her name she did open her eyes and make eye contact.  I asked her her name and she weakly voiced her name, but then quickly closes her eyes again.  I asked her to if she could tell me where she is at and she started to move her lips but did not give a discernible response, eyes remain closed.  I again asked her to tell me where she is and attempted to wake her up but she did not open her eyes or respond.  Fortunately she does not appear to be uncomfortable or in distress.  I spoke with the hospitalist Dr. Nelson Chimes who states that she did call and speak with patient's husband, began the conversation about possible need for PEG tube if she does not improve and he would like to continue full scope of care.  Given she has had this conversation today I elected to not call the patient's husband as past notes seem to indicate he got frustrated with too much talk about lack of improvement.  However, I will follow-up tomorrow after I see the patient to discuss goals of care out of necessity for patient centered care.  I provided emotional and general support through therapeutic touch and patient reassurance.  I answered all questions and addressed all concerns to the best of my ability.  Review of Systems  Unable to perform ROS: Mental status change    Objective:   Vital  Signs:  BP (!) 131/45   Pulse 82   Temp 98 F (36.7 C)   Resp 20   Ht 5\' 9"  (1.753 m)   Wt 91.2 kg   SpO2 99%   BMI 29.69 kg/m   Physical Exam Vitals and nursing note reviewed.  Constitutional:      General: She is sleeping. She is not in acute distress.    Comments: Opens eyes to verbal and tactile  stimuli, made eye contact, gave her name (weakly) but then did not awaken to other questions.  HENT:     Head: Normocephalic and atraumatic.  Cardiovascular:     Rate and Rhythm: Normal rate.  Pulmonary:     Effort: Pulmonary effort is normal. No respiratory distress.     Breath sounds: No wheezing or rhonchi.  Abdominal:     General: Abdomen is flat. Bowel sounds are normal.     Palpations: Abdomen is soft.     Comments: Noted NG tube in place  Skin:    General: Skin is warm and dry.  Neurological:     Mental Status: She is disoriented and unresponsive.     Comments: Minimally responsive     Palliative Assessment/Data: 10% currently due to AMS    Existing Vynca/ACP Documentation: None  Assessment & Plan:   Impression: Present on Admission:  Hepatic encephalopathy (HCC)  AMS (altered mental status)  Anemia in chronic kidney disease (CKD)  Aortic atherosclerosis (HCC)  Beta thalassemia intermedia (HCC)  Chronic kidney disease, stage 3b (HCC)  Depression with anxiety  HLD (hyperlipidemia)  Type 2 diabetes mellitus with hyperlipidemia (HCC)  Hypokalemia  SUMMARY OF RECOMMENDATIONS   Continue full code Full scope of care Family hopeful for improvement based on past experience I'm concerned about 5 days of ongoing treatment and minimal improvement Ongoing GOC conversations with family pending progression of clinical course Palliative medicine will follow-up tomorrow to see patient and engaged with family  Symptom Management:  Per primary team PMT is available to assist as needed  Code Status: Full code  Prognosis: Unable to determine  Discharge Planning: To Be Determined  Discussed with: Medical team, nursing team  Thank you for allowing [place] to participate in the care of Rachel Huber PMT will continue to support holistically.  Time Total: 30 min  Detailed review of medical records (labs, imaging, vital signs), medically appropriate exam, discussed with  treatment team, counseling and education to patient, family, & staff, documenting clinical information, medication management, coordination of care  Wynne Dust, NP Palliative Medicine Team  Team Phone # [phone] (Nights/Weekends)  04/16/2021, 8:17 AM

## 2023-08-14 DIAGNOSIS — R4182 Altered mental status, unspecified: Secondary | ICD-10-CM | POA: Diagnosis not present

## 2023-08-14 DIAGNOSIS — Z515 Encounter for palliative care: Secondary | ICD-10-CM | POA: Diagnosis not present

## 2023-08-14 DIAGNOSIS — K7682 Hepatic encephalopathy: Secondary | ICD-10-CM | POA: Diagnosis not present

## 2023-08-14 DIAGNOSIS — N1832 Chronic kidney disease, stage 3b: Secondary | ICD-10-CM | POA: Diagnosis not present

## 2023-08-14 DIAGNOSIS — F418 Other specified anxiety disorders: Secondary | ICD-10-CM | POA: Diagnosis not present

## 2023-08-14 LAB — GLUCOSE, CAPILLARY
Glucose-Capillary: 196 mg/dL — ABNORMAL HIGH (ref 70–99)
Glucose-Capillary: 204 mg/dL — ABNORMAL HIGH (ref 70–99)
Glucose-Capillary: 233 mg/dL — ABNORMAL HIGH (ref 70–99)
Glucose-Capillary: 235 mg/dL — ABNORMAL HIGH (ref 70–99)
Glucose-Capillary: 247 mg/dL — ABNORMAL HIGH (ref 70–99)
Glucose-Capillary: 248 mg/dL — ABNORMAL HIGH (ref 70–99)

## 2023-08-14 LAB — BASIC METABOLIC PANEL
Anion gap: 3 — ABNORMAL LOW (ref 5–15)
BUN: 47 mg/dL — ABNORMAL HIGH (ref 8–23)
CO2: 26 mmol/L (ref 22–32)
Calcium: 8.7 mg/dL — ABNORMAL LOW (ref 8.9–10.3)
Chloride: 108 mmol/L (ref 98–111)
Creatinine, Ser: 0.79 mg/dL (ref 0.44–1.00)
GFR, Estimated: >60 mL/min (ref >60)
Glucose, Bld: 220 mg/dL — ABNORMAL HIGH (ref 70–99)
Potassium: 4.6 mmol/L (ref 3.5–5.1)
Sodium: 137 mmol/L (ref 135–145)

## 2023-08-14 LAB — MAGNESIUM: Magnesium: 2.1 mg/dL (ref 1.7–2.4)

## 2023-08-14 MED ORDER — ACETAMINOPHEN 325 MG PO TABS
650.0000 mg | ORAL_TABLET | Freq: Four times a day (QID) | ORAL | Status: DC | PRN
  Administered 2023-08-14 – 2023-08-25 (×5): 650 mg
  Filled 2023-08-14 (×4): qty 2

## 2023-08-14 MED ORDER — ACETAMINOPHEN 650 MG RE SUPP: 325.0000 mg | Freq: Four times a day (QID) | RECTAL | Status: DC | PRN

## 2023-08-14 NOTE — Progress Notes (Signed)
Daily Progress Note   Patient Name: Rachel Huber       Date: 08/14/2023 DOB: May 23, 1956  Age: 67 y.o. 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    12/12: Patient has been excepting her lactulose and having multiple bowel movements.  Mental status remains poor 12/13: No clinical status changes.  Patient is a little bit more awake.  Has been taking her p.o. lactulose. 12/15: Mental status seems close to baseline.  Waiting for PT and OT eval 12/16: PT and OT recommends SNF.  Husband refusing to take her back to [facility].  TOC working on alternative placement 12/17: Patient refusing lactulose at times.  Ammonia trending up 12/18: Lactulose enema was given as an option yesterday for oral lactulose when she is not able to take.  Ammonia 94 12/19: Patient reportedly fell overnight/early morning and has new left intertrochanteric hip fracture.  Going for surgery/OR today 12/20 s/p left hip cephalmomedullary nail 12/22 GI has been consulted-recommendations for NGT placement to ensure administration of lactulose, liver US assessing for ascites for potential paracentesis to assess for SBP--recommendations to assess for other sources of encephalopthy   Palliative Medicine consulted for assisting with goals of care  conversations.  Subjective:   Subjective: Chart Reviewed. Updates received. Patient Assessed. Created space and opportunity for patient  and family to explore thoughts and feelings regarding current medical situation.  Today's Discussion: Today I saw the patient at the bedside, no family or staff present.  When I called her name she appeared to attempt to open her eyes, but did not.  I made multiple times using verbal and tactile stimuli without success.  Around this time the patient's nurse entered the room and states that she has done essentially the same for her. Fortunately she does not appear to be uncomfortable or in distress.  I attempted to call the patient's husband [name] to discuss ongoing goals of care.  However, he did not answer and I left a HIPAA appropriate voicemail for call back.    Given no improvement over the past 5 days despite significantly improved ammonia levels I am concerned about long-term prognosis.  She does have an NG tube in which cannot stay in indefinitely.  My discussion with the hospitalist yesterday noted that she has brought up need for PEG tube if patient's family continues to want full aggressive care.  I provided emotional and general support to the patient through therapeutic touch and reassurance of safety.  I answered all questions and addressed all concerns to the best of my ability.  Review of Systems  Unable to perform ROS: Mental status change    Objective:   Vital Signs:  BP (!) 140/50 (BP Location: Left Arm)   Pulse 90   Temp 99.9 F (37.7 C)   Resp 19   Ht 5\' 9"  (1.753 m)   Wt 91.2 kg   SpO2 99%   BMI 29.69 kg/m  Physical Exam Vitals and nursing note reviewed.  Constitutional:      General: She is sleeping. She is not in acute distress.    Comments: Attempts to open eyes to verbal and tactile stimuli, but unsuccessful  HENT:     Head: Normocephalic and atraumatic.  Cardiovascular:     Rate and Rhythm: Normal rate.   Pulmonary:     Effort: Pulmonary effort is normal. No respiratory distress.     Breath sounds: No wheezing or rhonchi.  Abdominal:     General: Abdomen is flat. Bowel sounds are normal.     Palpations: Abdomen is soft.     Comments: Noted NG tube in place  Skin:    General: Skin is warm and dry.  Neurological:     Mental Status: She is disoriented and unresponsive.     Comments: Minimally responsive     Palliative Assessment/Data: 10% currently due to AMS    Existing Vynca/ACP Documentation: None  Assessment & Plan:   Impression: Present on Admission:  Hepatic encephalopathy (HCC)  AMS (altered mental status)  Anemia in chronic kidney disease (CKD)  Aortic atherosclerosis (HCC)  Beta thalassemia intermedia (HCC)  Chronic kidney disease, stage 3b (HCC)  Depression with anxiety  HLD (hyperlipidemia)  Type 2 diabetes mellitus with hyperlipidemia (HCC)  Hypokalemia  SUMMARY OF RECOMMENDATIONS   Continue full code Full scope of care Family hopeful for improvement based on past experiences I'm concerned about 6 days of ongoing treatment and minimal improvement Continue attempts to have further GOC conversations with family Palliative medicine will continue to follow for ongoing conversations with family and/or clinical change  Symptom Management:  Per primary team PMT is available to assist as needed  Code Status: Full code  Prognosis: Unable to determine  Discharge Planning: To Be Determined  Discussed with: Medical team, nursing team  Thank you for allowing [place] to participate in the care of Don Broach PMT will continue to support holistically.  Time Total: 30 min  Detailed review of medical records (labs, imaging, vital signs), medically appropriate exam, discussed with treatment team, counseling and education to patient, family, & staff, documenting clinical information, medication management, coordination of care  Wynne Dust, NP Palliative Medicine  Team  Team Phone # [phone] (Nights/Weekends)  04/16/2021, 8:17 AM

## 2023-08-14 NOTE — Consult Note (Signed)
PHARMACY CONSULT NOTE - ELECTROLYTES  Pharmacy Consult for Electrolyte Monitoring and Replacement   Recent Labs: Height: 5\' 9"  (175.3 cm) Weight: 91.2 kg (201 lb 1 oz) IBW/kg (Calculated) : 66.2 Estimated Creatinine Clearance: 84.3 mL/min (by C-G formula based on SCr of 0.79 mg/dL). Potassium (mmol/L)  Date Value  08/14/2023 4.6   Magnesium (mg/dL)  Date Value  95/62/1308 2.1   Calcium (mg/dL)  Date Value  65/78/4696 8.7 (L)   Albumin (g/dL)  Date Value  29/52/8413 2.5 (L)   Phosphorus (mg/dL)  Date Value  24/40/1027 2.6   Sodium (mmol/L)  Date Value  08/14/2023 137   Assessment  Rachel Huber is a 67 y.o. female presenting with hepatic encephalopathy. PMH significant for DM, CKD, NASH w/ cirrhosis, HLD, depression and anxiety . Pharmacy has been consulted to monitor and replace electrolytes.  Diet: Tube feeds at 50 cc/hr + free water 300 cc q4h (3L/day) Misc: Lactulose 30 g 4x/day  Goal of Therapy: Electrolytes WNL  Plan:  No electrolyte replacement at this time Follow-up electrolytes with AM labs tomorrow  Thank you for allowing pharmacy to be a part of this patient's care.  Barrie Folk 08/14/2023 7:44 AM

## 2023-08-14 NOTE — Progress Notes (Signed)
OT Cancellation Note  Patient Details Name: Rachel Huber MRN: [id number] DOB: 1956/06/05   Cancelled Treatment:    Reason Eval/Treat Not Completed: Medical issues which prohibited therapy. Pt continues to be lethargic with significant difficulty responding to various stimuli in order to meaningfully participate in therapy. Will complete current order at this time. Please re-consult should pt become more appropriate.   Arman Filter., MPH, MS, OTR/L ascom [phone] 08/14/23, 3:40 PM

## 2023-08-14 NOTE — Plan of Care (Signed)

## 2023-08-14 NOTE — Progress Notes (Signed)
Progress Note   Patient: Rachel Huber FIE:[id number] DOB: July 04, 1956 DOA: 07/28/2023     17 DOS: the patient was seen and examined on 08/14/2023   Brief hospital course: 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  Patient was diagnosed with hepatic encephalopathy, was given lactulose and IV fluids.  Patient mental status has been waxing and waning.  Patient had a fall while in the hospital, sustained a new left intertrochanteric hip fracture, had intertrochanteric nail performed on 12/19. 12/21.  Patient became unresponsive, ammonia level went up to more than 400, NG tube was placed, patient was given lactulose.  Received 2 units of PRBC. 12/22.  Patient became more responsive today, still very confused. 12/23: Patient remained quite lethargic, not following any commands and appears significantly dry with sodium of 152-started on D5 with hypotonic saline.  High risk for refeeding syndrome so close monitoring of electrolytes required.  Hemoglobin at 7.3, anemia panel consistent with anemia of chronic disease.  Having bowel movements.  High risk for deterioration and mortality-palliative care is on board but currently patient remained full code.  12/24: Patient remained pretty much unresponsive, minimum response to painful stimuli, worsening hypernatremia and hyperglycemia since she was started on tube feed.  Increasing free water, D5 and adding SSI. Family remain very optimistic for her full recovery and want everything to be  done.  12/25: No change in clinical status.  Slight improvement in sodium to 153, CBG elevated, continuing D5 and increasing the dose of NovoLog to 6 unit, although increasing free water intake through NG tube.  12/26; patient open eyes and make a very short eye contact when called her name today.  Not following any other commands.  Hypernatremia resolved.  Stopping IV fluid and we will continue with free water during tube feed. Hemoglobin decreased to 6.7 so ordered 1 unit of PRBC.  Worsening thrombocytopenia.  CBG remained elevated but now we are stopping D5 hopefully that will improve.  12/27: Labs and vital stable, no change in mental status, remained on tube feed.  Started conversation regarding PEG placement if she has to remain full scope of care.   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 Suspect secondary to patient refusal to take lactulose at nursing facility Patient has been treated with lactulose, ammonia level seem to be improving.  Peaked at 643.  However, patient has a worsening mental status 12/21, she became unresponsive. Due to altered mental status, patient has not been able to take lactulose  GI consult was obtained, lactulose was given after NG tube was placed.  So far blood culture has been negative,  no need for additional prophylactic treatment with Rocephin. Due to altered mental status, not safe for oral intake, started on tube feed. Her MRI of the brain in September 2024 showed - 2.2 x 1.5 cm heavily calcified meningioma in the right middle cranial fossa with some mass effect on the lateral aspect of the right temporal tip. MRI brain is unchanged from before Patient long-term prognosis is very poor, palliative care is following. -Continue with supportive care  Hypernatremia.  Improved with sodium of 142 today Stopping D5 -Continue with free water through NG tube -Monitor sodium   Anemia in chronic kidney disease  (CKD) Patient received total of 3 unit of PRBC. -Continue to monitor and transfuse below 7   Acute on chronic kidney disease, stage 3b (HCC) Mild metabolic acidosis. Hypokalemia. Hypomagnesemia. Hypophosphatemia Renal function and potassium improved today,     Acute left intertrochanteric hip fracture Status post fall on 12/19 around 4 AM.  Per report she was trying to get out of the bed by herself to go to the bathroom.   Underwent head CT which was negative for acute findings. Left hip x-ray showed acute fracture -status post left hip cephalomedullary nail in place orthopedics on 12/19 Pain medicine as needed as ordered Aspirin for DVT prophylaxis Weightbearing as tolerated on the left hip per orthopedic.   Obesity class I. Estimated body mass index is 29.69 kg/m as calculated from the following:   Height as of this encounter: 5\' 9"  (1.753 m).   Weight as of this encounter: 91.2 kg.  Complicating factor to overall care and prognosis Continue CPAP while asleep.      Subjective:  Patient just moan on deep sternal rub, did not even open eyes today.  Physical Exam: Vitals:   08/14/23 0436 08/14/23 0830 08/14/23 1217 08/14/23 1714  BP: (!) 146/59 (!) 140/50 (!) 128/50 (!) 129/49  Pulse: 88 90 79 78  Resp: 17 19 19 19   Temp: 99.1 F (37.3 C) 99.9 F (37.7 C) 99.2 F (37.3 C) 99.8 F (37.7 C)  TempSrc:      SpO2: 100% 99% 100% 99%  Weight:      Height:       General.  Chronically ill-appearing lady, in no acute distress. Pulmonary.  Lungs clear bilaterally, normal respiratory effort. CV.  Regular rate and rhythm, no JVD, rub or murmur. Abdomen.  Soft, nontender, nondistended, BS positive. CNS.  Just moans on deep sternal rub, not following any other commands Extremities.  No edema, no cyanosis, pulses intact and symmetrical.  Data Reviewed:  Lab results reviewed.  Family Communication: Talked with husband on phone.  Disposition: Status is: Inpatient Remains  inpatient appropriate because: Severity of disease, IV treatment.  DVT Phrophylaxis. heparin Time spent: 50 minutes  This record has been created using Conservation officer, historic buildings. Errors have been sought and corrected,but may not always be located. Such creation errors do not reflect on the standard of care.   Author: Arnetha Courser, MD 08/14/2023 6:15 PM  For on call review [email or web address].

## 2023-08-14 NOTE — Inpatient Diabetes Management (Signed)
Inpatient Diabetes Program Recommendations  AACE/ADA: New Consensus Statement on Inpatient Glycemic Control   Target Ranges:  Prepandial:   less than 140 mg/dL      Peak postprandial:   less than 180 mg/dL (1-2 hours)      Critically ill patients:  140 - 180 mg/dL    Latest Reference Range & Units 08/14/23 00:56 08/14/23 04:44 08/14/23 08:31  Glucose-Capillary 70 - 99 mg/dL 161 (H) 096 (H) 045 (H)    Latest Reference Range & Units 08/13/23 08:33 08/13/23 12:04 08/13/23 17:48 08/13/23 19:56 08/13/23 21:18  Glucose-Capillary 70 - 99 mg/dL 409 (H) 811 (H) 914 (H) 190 (H) 222 (H)   Review of Glycemic Control  Diabetes history:DM2 Outpatient Diabetes medications: None Current orders for Inpatient glycemic control: Novolog 0-15 units Q4H, Novolog 6 units Q4H; Osmolite @ 50 ml/hr  Inpatient Diabetes Program Recommendations:    Insulin: CBGs ranged from 190-269 mg/dl over the past 24 hours and patient has received a total of Novolog 79 units (for tube feeding coverage and correction). Please consider ordering Semglee 15 units Q24H.  Thanks, Orlando Penner, RN, MSN, CDCES Diabetes Coordinator Inpatient Diabetes Program [phone] (Team Pager from 8am to 5pm)

## 2023-08-15 DIAGNOSIS — Z515 Encounter for palliative care: Secondary | ICD-10-CM | POA: Diagnosis not present

## 2023-08-15 DIAGNOSIS — R4182 Altered mental status, unspecified: Secondary | ICD-10-CM | POA: Diagnosis not present

## 2023-08-15 DIAGNOSIS — D561 Beta thalassemia: Secondary | ICD-10-CM | POA: Diagnosis not present

## 2023-08-15 DIAGNOSIS — N1832 Chronic kidney disease, stage 3b: Secondary | ICD-10-CM | POA: Diagnosis not present

## 2023-08-15 DIAGNOSIS — F418 Other specified anxiety disorders: Secondary | ICD-10-CM | POA: Diagnosis not present

## 2023-08-15 DIAGNOSIS — K7682 Hepatic encephalopathy: Secondary | ICD-10-CM | POA: Diagnosis not present

## 2023-08-15 LAB — GLUCOSE, CAPILLARY
Glucose-Capillary: 135 mg/dL — ABNORMAL HIGH (ref 70–99)
Glucose-Capillary: 142 mg/dL — ABNORMAL HIGH (ref 70–99)
Glucose-Capillary: 178 mg/dL — ABNORMAL HIGH (ref 70–99)
Glucose-Capillary: 186 mg/dL — ABNORMAL HIGH (ref 70–99)
Glucose-Capillary: 204 mg/dL — ABNORMAL HIGH (ref 70–99)

## 2023-08-15 LAB — CBC
HCT: 21.6 % — ABNORMAL LOW (ref 36.0–46.0)
Hemoglobin: 6.7 g/dL — ABNORMAL LOW (ref 12.0–15.0)
MCH: 25.8 pg — ABNORMAL LOW (ref 26.0–34.0)
MCHC: 31 g/dL (ref 30.0–36.0)
MCV: 83.1 fL (ref 80.0–100.0)
Platelets: 100 10*3/uL — ABNORMAL LOW (ref 150–400)
RBC: 2.6 MIL/uL — ABNORMAL LOW (ref 3.87–5.11)
RDW: 20.3 % — ABNORMAL HIGH (ref 11.5–15.5)
WBC: 6.5 10*3/uL (ref 4.0–10.5)
nRBC: 0 % (ref 0.0–0.2)

## 2023-08-15 LAB — BASIC METABOLIC PANEL
Anion gap: 3 — ABNORMAL LOW (ref 5–15)
BUN: 51 mg/dL — ABNORMAL HIGH (ref 8–23)
CO2: 27 mmol/L (ref 22–32)
Calcium: 9.2 mg/dL (ref 8.9–10.3)
Chloride: 111 mmol/L (ref 98–111)
Creatinine, Ser: 0.78 mg/dL (ref 0.44–1.00)
GFR, Estimated: >60 mL/min (ref >60)
Glucose, Bld: 244 mg/dL — ABNORMAL HIGH (ref 70–99)
Potassium: 4.7 mmol/L (ref 3.5–5.1)
Sodium: 141 mmol/L (ref 135–145)

## 2023-08-15 LAB — PHOSPHORUS: Phosphorus: 2 mg/dL — ABNORMAL LOW (ref 2.5–4.6)

## 2023-08-15 LAB — MAGNESIUM: Magnesium: 2.4 mg/dL (ref 1.7–2.4)

## 2023-08-15 LAB — PREPARE RBC (CROSSMATCH)

## 2023-08-15 LAB — AMMONIA: Ammonia: 35 umol/L (ref 9–35)

## 2023-08-15 MED ORDER — POTASSIUM & SODIUM PHOSPHATES 280-160-250 MG PO PACK
2.0000 | PACK | Freq: Once | ORAL | Status: AC
  Administered 2023-08-15: 2
  Filled 2023-08-15: qty 2

## 2023-08-15 MED ORDER — K PHOS MONO-SOD PHOS DI & MONO 155-852-130 MG PO TABS: 500.0000 mg | ORAL_TABLET | Freq: Once | ORAL | Status: DC

## 2023-08-15 MED ORDER — SODIUM CHLORIDE 0.9% IV SOLUTION: Freq: Once | INTRAVENOUS | Status: AC

## 2023-08-15 MED ORDER — SODIUM PHOSPHATES 45 MMOLE/15ML IV SOLN: 30.0000 mmol | Freq: Once | INTRAVENOUS | Status: DC

## 2023-08-15 NOTE — Progress Notes (Signed)
Progress Note   Patient: Rachel Huber:[id number] DOB: [birth year] DOA: 07/28/2023     18 DOS: the patient was seen and examined on 08/15/2023   Brief hospital course: 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  Patient was diagnosed with hepatic encephalopathy, was given lactulose and IV fluids.  Patient mental status has been waxing and waning.  Patient had a fall while in the hospital, sustained a new left intertrochanteric hip fracture, had intertrochanteric nail performed on 12/19. 12/21.  Patient became unresponsive, ammonia level went up to more than 400, NG tube was placed, patient was given lactulose.  Received 2 units of PRBC. 12/22.  Patient became more responsive today, still very confused. 12/23: Patient remained quite lethargic, not following any commands and appears significantly dry with sodium of 152-started on D5 with hypotonic saline.  High risk for refeeding syndrome so close monitoring of electrolytes required.  Hemoglobin at 7.3, anemia panel consistent with anemia of chronic disease.  Having bowel movements.  High risk for deterioration and mortality-palliative care is on board but currently patient remained full code.  12/24: Patient remained pretty much unresponsive, minimum response to painful stimuli, worsening hypernatremia and hyperglycemia since she was started on tube feed.  Increasing free water, D5 and adding SSI. Family remain very optimistic for her full recovery and want everything to be  done.  12/25: No change in clinical status.  Slight improvement in sodium to 153, CBG elevated, continuing D5 and increasing the dose of NovoLog to 6 unit, although increasing free water intake through NG tube.  12/26; patient open eyes and make a very short eye contact when called her name today.  Not following any other commands.  Hypernatremia resolved.  Stopping IV fluid and we will continue with free water during tube feed. Hemoglobin decreased to 6.7 so ordered 1 unit of PRBC.  Worsening thrombocytopenia.  CBG remained elevated but now we are stopping D5 hopefully that will improve.  12/27: Labs and vital stable, no change in mental status, remained on tube feed.  Started conversation regarding PEG placement if she has to remain full scope of care.  12/28: No significant change in mental status.  Family seems interested in PEG tube placement.  Hemoglobin decreased to 6.7-ordered 1 unit of PRBC   Assessment and Plan: Hepatic encephalopathy (HCC) Decompensated NASH cirrhosis with ascites. Suspect secondary to patient refusal to take lactulose at nursing facility Patient has been treated with lactulose, ammonia level seem to be improving.  Peaked at 643.  However, patient has a worsening mental status 12/21, she became unresponsive. Due to altered mental status, patient has not been able to take lactulose  GI consult was obtained, lactulose was given after NG tube was placed.  So far blood culture has been negative,  no need for additional prophylactic treatment with Rocephin. Due to altered mental status, not safe for oral intake, started on tube feed. Her MRI of the brain in September 2024 showed - 2.2 x 1.5 cm heavily calcified meningioma in the right middle cranial fossa with some mass effect on the lateral aspect of the  right temporal tip. MRI brain is unchanged from before Patient long-term prognosis is very poor, palliative care is following. -Continue with supportive  care  Hypernatremia.  Improved with sodium of 141 today -Continue with free water through NG tube -Monitor sodium   Anemia in chronic kidney disease (CKD) Patient received total of 3 unit of PRBC.  With another decrease of hemoglobin to 6.7-ordered another unit -Continue to monitor and transfuse below 7   Acute on chronic kidney disease, stage 3b (HCC) Mild metabolic acidosis. Hypokalemia. Hypomagnesemia. Hypophosphatemia Renal function and potassium improved today,     Acute left intertrochanteric hip fracture Status post fall on 12/19 around 4 AM.  Per report she was trying to get out of the bed by herself to go to the bathroom.   Underwent head CT which was negative for acute findings. Left hip x-ray showed acute fracture -status post left hip cephalomedullary nail in place orthopedics on 12/19 Pain medicine as needed as ordered Aspirin for DVT prophylaxis Weightbearing as tolerated on the left hip per orthopedic.   Obesity class I. Estimated body mass index is 29.69 kg/m as calculated from the following:   Height as of this encounter: 5\' 9"  (1.753 m).   Weight as of this encounter: 91.2 kg.  Complicating factor to overall care and prognosis Continue CPAP while asleep.      Subjective:  Patient just open eyes when called her name, not following any other commands  Physical Exam: Vitals:   08/15/23 0809 08/15/23 1015 08/15/23 1115 08/15/23 1324  BP: (!) 125/39 (!) 131/46 (!) 124/49 (!) 147/128  Pulse: 84 82 78 76  Resp: 19 20 20 20   Temp: 97.8 F (36.6 C) (!) 100.5 F (38.1 C) 99.5 F (37.5 C) 100 F (37.8 C)  TempSrc:  Axillary Axillary Axillary  SpO2: 100% 99% 99% 99%  Weight:      Height:       General.  Chronically ill-appearing lady, in no acute distress. Pulmonary.  Lungs clear bilaterally, normal respiratory effort. CV.  Regular rate and rhythm, no JVD, rub or murmur. Abdomen.  Soft, nontender, nondistended, BS positive. CNS.  Remained  encephalopathic, not following any commands. Extremities.  No edema, no cyanosis, pulses intact and symmetrical..   Data Reviewed:  Lab results reviewed.  Family Communication:   Disposition: Status is: Inpatient Remains inpatient appropriate because: Severity of disease, IV treatment.  DVT Phrophylaxis. heparin Time spent: 50 minutes  This record has been created using Conservation officer, historic buildings. Errors have been sought and corrected,but may not always be located. Such creation errors do not reflect on the standard of care.   Author: Arnetha Courser, MD 08/15/2023 3:18 PM  For on call review [email or web address].

## 2023-08-15 NOTE — Consult Note (Signed)
PHARMACY CONSULT NOTE - ELECTROLYTES  Pharmacy Consult for Electrolyte Monitoring and Replacement   Recent Labs: Height: 5\' 9"  (175.3 cm) Weight: 91.2 kg (201 lb 1 oz) IBW/kg (Calculated) : 66.2 Estimated Creatinine Clearance: 84.3 mL/min (by C-G formula based on SCr of 0.78 mg/dL). Potassium (mmol/L)  Date Value  08/15/2023 4.7   Magnesium (mg/dL)  Date Value  29/52/8413 2.4   Calcium (mg/dL)  Date Value  24/40/1027 9.2   Albumin (g/dL)  Date Value  25/36/6440 2.5 (L)   Phosphorus (mg/dL)  Date Value  34/74/2595 2.0 (L)   Sodium (mmol/L)  Date Value  08/15/2023 141   Assessment  Rachel Huber is a 67 y.o. female presenting with hepatic encephalopathy. PMH significant for DM, CKD, NASH w/ cirrhosis, HLD, depression and anxiety . Pharmacy has been consulted to monitor and replace electrolytes.  Diet: Tube feeds at 50 cc/hr + free water 300 cc q4h (3L/day) Misc: Lactulose 30 g 4x/day  Goal of Therapy: Electrolytes WNL  Plan:  Phos 2.0   K 4.7   Will order Phos-NaK  2 packets per tube x 1 Follow-up electrolytes with AM labs tomorrow  Thank you for allowing pharmacy to be a part of this patient's care.  Demarrius Guerrero A 08/15/2023 1:21 PM

## 2023-08-15 NOTE — Progress Notes (Signed)
Daily Progress Note   Patient Name: Rachel Huber       Date: 08/15/2023 DOB: 1956/07/13  Age: 67 y.o. 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    12/12: Patient has been excepting her lactulose and having multiple bowel movements.  Mental status remains poor 12/13: No clinical status changes.  Patient is a little bit more awake.  Has been taking her p.o. lactulose. 12/15: Mental status seems close to baseline.  Waiting for PT and OT eval 12/16: PT and OT recommends SNF.  Husband refusing to take her back to [facility].  TOC working on alternative placement 12/17: Patient refusing lactulose at times.  Ammonia trending up 12/18: Lactulose enema was given as an option yesterday for oral lactulose when she is not able to take.  Ammonia 94 12/19: Patient reportedly fell overnight/early morning and has new left intertrochanteric hip fracture.  Going for surgery/OR today 12/20 s/p left hip cephalmomedullary nail 12/22 GI has been consulted-recommendations for NGT placement to ensure administration of lactulose, liver US assessing  for ascites for potential paracentesis to assess for SBP--recommendations to assess for other sources of encephalopthy   Palliative Medicine consulted for assisting with goals of care conversations.  Subjective: Extensive chart review has been completed prior to meeting patient including labs, vital signs, imaging, progress notes, orders, and available advanced directive documents from current and previous encounters.    Visited with Rachel Huber at her bedside. She is resting in bed with eyes closed, attempts to open her eyes with calling of her voice and intermittently follows simple commands. NGT remains. No family at bedside during time of visit.  Called and spoke with husband-Michael. [name] confirms he has had conversations with primary team regarding PEG tube placement. [name] shares he feels this is most  appropriate for Rachel Huber at this time for long term assurance that she can receive her lactulose. Michael plans to visit at bedside tomorrow and time set to meet in the afternoon.  Answered and addressed all questions and concerns. PMT to remain available for ongoing needs and support.  Care plan was discussed with nursing staff.  Thank you for allowing the Palliative Medicine Team to assist in the care of this patient.  Total time:  35 minutes  Time spent includes: Detailed review of medical records (labs, imaging, vital signs), medically appropriate exam (mental status, respiratory, cardiac, skin), discussed with treatment team, counseling and educating patient, family and staff, documenting clinical information, medication management and coordination of care.  Leeanne Deed, DNP, AGNP-C Palliative Medicine   Please contact Palliative Medicine Team phone at [phone] for questions and concerns.

## 2023-08-16 DIAGNOSIS — D561 Beta thalassemia: Secondary | ICD-10-CM | POA: Diagnosis not present

## 2023-08-16 DIAGNOSIS — K7682 Hepatic encephalopathy: Secondary | ICD-10-CM | POA: Diagnosis not present

## 2023-08-16 DIAGNOSIS — R4182 Altered mental status, unspecified: Secondary | ICD-10-CM | POA: Diagnosis not present

## 2023-08-16 DIAGNOSIS — N1832 Chronic kidney disease, stage 3b: Secondary | ICD-10-CM | POA: Diagnosis not present

## 2023-08-16 DIAGNOSIS — F418 Other specified anxiety disorders: Secondary | ICD-10-CM | POA: Diagnosis not present

## 2023-08-16 DIAGNOSIS — Z515 Encounter for palliative care: Secondary | ICD-10-CM | POA: Diagnosis not present

## 2023-08-16 LAB — TYPE AND SCREEN
ABO/RH(D): A POS
Antibody Screen: NEGATIVE
Unit division: 0
Unit division: 0

## 2023-08-16 LAB — BPAM RBC
Blood Product Expiration Date: 202412302359
Blood Product Expiration Date: 202501252359
ISSUE DATE / TIME: 202412261313
ISSUE DATE / TIME: 202412281012
Unit Type and Rh: 600
Unit Type and Rh: 6200

## 2023-08-16 LAB — BASIC METABOLIC PANEL
Anion gap: 5 (ref 5–15)
BUN: 47 mg/dL — ABNORMAL HIGH (ref 8–23)
CO2: 28 mmol/L (ref 22–32)
Calcium: 9.4 mg/dL (ref 8.9–10.3)
Chloride: 114 mmol/L — ABNORMAL HIGH (ref 98–111)
Creatinine, Ser: 0.85 mg/dL (ref 0.44–1.00)
GFR, Estimated: >60 mL/min (ref >60)
Glucose, Bld: 160 mg/dL — ABNORMAL HIGH (ref 70–99)
Potassium: 4.7 mmol/L (ref 3.5–5.1)
Sodium: 147 mmol/L — ABNORMAL HIGH (ref 135–145)

## 2023-08-16 LAB — HEMOGLOBIN AND HEMATOCRIT, BLOOD
HCT: 22.4 % — ABNORMAL LOW (ref 36.0–46.0)
Hemoglobin: 7.1 g/dL — ABNORMAL LOW (ref 12.0–15.0)

## 2023-08-16 LAB — GLUCOSE, CAPILLARY
Glucose-Capillary: 114 mg/dL — ABNORMAL HIGH (ref 70–99)
Glucose-Capillary: 136 mg/dL — ABNORMAL HIGH (ref 70–99)
Glucose-Capillary: 142 mg/dL — ABNORMAL HIGH (ref 70–99)
Glucose-Capillary: 146 mg/dL — ABNORMAL HIGH (ref 70–99)
Glucose-Capillary: 148 mg/dL — ABNORMAL HIGH (ref 70–99)
Glucose-Capillary: 153 mg/dL — ABNORMAL HIGH (ref 70–99)
Glucose-Capillary: 172 mg/dL — ABNORMAL HIGH (ref 70–99)

## 2023-08-16 LAB — AMMONIA: Ammonia: 40 umol/L — ABNORMAL HIGH (ref 9–35)

## 2023-08-16 LAB — MAGNESIUM: Magnesium: 2.5 mg/dL — ABNORMAL HIGH (ref 1.7–2.4)

## 2023-08-16 LAB — PHOSPHORUS: Phosphorus: 3.5 mg/dL (ref 2.5–4.6)

## 2023-08-16 MED ORDER — FREE WATER
300.0000 mL | Status: DC
  Administered 2023-08-16 – 2023-08-17 (×14): 300 mL

## 2023-08-16 NOTE — Progress Notes (Signed)
Progress Note   Patient: Rachel Huber:[id number] DOB: [birth year] DOA: 07/28/2023     19 DOS: the patient was seen and examined on 08/16/2023   Brief hospital course: 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  Patient was diagnosed with hepatic encephalopathy, was given lactulose and IV fluids.  Patient mental status has been waxing and waning.  Patient had a fall while in the hospital, sustained a new left intertrochanteric hip fracture, had intertrochanteric nail performed on 12/19. 12/21.  Patient became unresponsive, ammonia level went up to more than 400, NG tube was placed, patient was given lactulose.  Received 2 units of PRBC. 12/22.  Patient became more responsive today, still very confused. 12/23: Patient remained quite lethargic, not following any commands and appears significantly dry with sodium of 152-started on D5 with hypotonic saline.  High risk for refeeding syndrome so close monitoring of electrolytes required.  Hemoglobin at 7.3, anemia panel consistent with anemia of chronic disease.  Having bowel movements.  High risk for deterioration and mortality-palliative care is on board but currently patient remained full code.  12/24: Patient remained pretty much unresponsive, minimum response to painful stimuli, worsening hypernatremia and hyperglycemia since she was started on tube feed.  Increasing free water, D5 and adding SSI. Family remain very optimistic for her full recovery and want everything to be  done.  12/25: No change in clinical status.  Slight improvement in sodium to 153, CBG elevated, continuing D5 and increasing the dose of NovoLog to 6 unit, although increasing free water intake through NG tube.  12/26; patient open eyes and make a very short eye contact when called her name today.  Not following any other commands.  Hypernatremia resolved.  Stopping IV fluid and we will continue with free water during tube feed. Hemoglobin decreased to 6.7 so ordered 1 unit of PRBC.  Worsening thrombocytopenia.  CBG remained elevated but now we are stopping D5 hopefully that will improve.  12/27: Labs and vital stable, no change in mental status, remained on tube feed.  Started conversation regarding PEG placement if she has to remain full scope of care.  12/28: No significant change in mental status.  Family seems interested in PEG tube placement.  Hemoglobin decreased to 6.7-ordered 1 unit of PRBC.  12/29: Hemoglobin improved to 7.1 after getting 1 unit of PRBC.  Again developing hypernatremia-increasing the frequency of free water through NG tube.  Lengthy discussion with husband along with palliative care at bedside, he was asking for PEG tube.  We discussed the logistics so he agreed to check her benefit for LTC as he will not be able to take care of her at home.  Patient likely have permanent damage at this point as there is no change in her encephalopathy.  Husband is also going to discuss with their children and other family member and then let us know about their plan.   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 Suspect secondary to patient refusal to take lactulose at nursing facility Patient has been treated with lactulose, ammonia level seem to be  improving.  Peaked at 643.  However, patient has a worsening mental status 12/21, she became unresponsive. Due to altered mental status, patient has not been able to take lactulose  GI consult  was obtained, lactulose was given after NG tube was placed.  So far blood culture has been negative,  no need for additional prophylactic treatment with Rocephin. Due to altered mental status, not safe for oral intake, started on tube feed. Her MRI of the brain in September 2024 showed - 2.2 x 1.5 cm heavily calcified meningioma in the right middle cranial fossa with some mass effect on the lateral aspect of the right temporal tip. MRI brain is unchanged from before Patient long-term prognosis is very poor, palliative care is following.  Remained encephalopathic. -Continue with supportive care  Hypernatremia.  Sodium starting increasing again today -Increasing free water through NG tube -Monitor sodium   Anemia in chronic kidney disease (CKD) Hemoglobin at 7.1 after getting fourth unit yesterday. -Continue to monitor   Acute on chronic kidney disease, stage 3b (HCC) Mild metabolic acidosis. Hypokalemia. Hypomagnesemia. Hypophosphatemia Renal function and potassium improved today,     Acute left intertrochanteric hip fracture Status post fall on 12/19 around 4 AM.  Per report she was trying to get out of the bed by herself to go to the bathroom.   Underwent head CT which was negative for acute findings. Left hip x-ray showed acute fracture -status post left hip cephalomedullary nail in place orthopedics on 12/19 Pain medicine as needed as ordered Aspirin for DVT prophylaxis Weightbearing as tolerated on the left hip per orthopedic.   Obesity class I. Estimated body mass index is 30.18 kg/m as calculated from the following:   Height as of this encounter: 5\' 9"  (1.753 m).   Weight as of this encounter: 92.7 kg.  Complicating factor to overall care and prognosis Continue CPAP while asleep.      Subjective:  Patient remained encephalopathic, at time appears little agitated but not following any commands.  Physical Exam: Vitals:   08/15/23 2108 08/16/23 0416 08/16/23 0500  08/16/23 0816  BP: (!) 136/58 (!) 120/35  116/71  Pulse: 86 77  73  Resp: 20 20  16   Temp: 100.3 F (37.9 C) 99 F (37.2 C)  98.6 F (37 C)  TempSrc:      SpO2: 99% 99%  100%  Weight:   92.7 kg   Height:       General.  Chronically ill-appearing lady, in no acute distress. Pulmonary.  Lungs clear bilaterally, normal respiratory effort. CV.  Regular rate and rhythm, no JVD, rub or murmur. Abdomen.  Soft, nontender, nondistended, BS positive. CNS.  Just open eyes momentarily when called her name, intermittent spontaneous movements, not following any commands. Extremities.  No edema, no cyanosis, pulses intact and symmetrical.   Data Reviewed:  Lab results reviewed.  Family Communication: Lengthy discussion with husband at bedside.  Disposition: Status is: Inpatient Remains inpatient appropriate because: Severity of disease, IV treatment.  DVT Phrophylaxis. heparin Time spent: 50 minutes  This record has been created using Conservation officer, historic buildings. Errors have been sought and corrected,but may not always be located. Such creation errors do not reflect on the standard of care.   Author: Arnetha Courser, MD 08/16/2023 2:01 PM  For on call review [email or web address].

## 2023-08-16 NOTE — Plan of Care (Signed)

## 2023-08-16 NOTE — Plan of Care (Signed)
°  Problem: Education: Goal: Knowledge of General Education information will improve Description: Including pain rating scale, medication(s)/side effects and non-pharmacologic comfort measures Outcome: Progressing   Problem: Clinical Measurements: Goal: Ability to maintain clinical measurements within normal limits will improve Outcome: Progressing   Problem: Activity: Goal: Risk for activity intolerance will decrease Outcome: Progressing   Problem: Nutrition: Goal: Adequate nutrition will be maintained Outcome: Progressing   Problem: Coping: Goal: Level of anxiety will decrease Outcome: Progressing   Problem: Pain Management: Goal: General experience of comfort will improve Outcome: Progressing   Problem: Safety: Goal: Ability to remain free from injury will improve Outcome: Progressing   Problem: Skin Integrity: Goal: Risk for impaired skin integrity will decrease Outcome: Progressing   Problem: Education: Goal: Ability to describe self-care measures that may prevent or decrease complications (Diabetes Survival Skills Education) will improve Outcome: Progressing   Problem: Coping: Goal: Ability to adjust to condition or change in health will improve Outcome: Progressing   Problem: Metabolic: Goal: Ability to maintain appropriate glucose levels will improve Outcome: Progressing   Problem: Skin Integrity: Goal: Risk for impaired skin integrity will decrease Outcome: Progressing   Problem: Tissue Perfusion: Goal: Adequacy of tissue perfusion will improve Outcome: Progressing

## 2023-08-16 NOTE — Consult Note (Signed)
PHARMACY CONSULT NOTE - ELECTROLYTES  Pharmacy Consult for Electrolyte Monitoring and Replacement   Recent Labs: Height: 5\' 9"  (175.3 cm) Weight: 92.7 kg (204 lb 5.9 oz) IBW/kg (Calculated) : 66.2 Estimated Creatinine Clearance: 80 mL/min (by C-G formula based on SCr of 0.85 mg/dL). Potassium (mmol/L)  Date Value  08/16/2023 4.7   Magnesium (mg/dL)  Date Value  16/05/9603 2.5 (H)   Calcium (mg/dL)  Date Value  54/04/8118 9.4   Albumin (g/dL)  Date Value  14/78/2956 2.5 (L)   Phosphorus (mg/dL)  Date Value  21/30/8657 3.5   Sodium (mmol/L)  Date Value  08/16/2023 147 (H)   Assessment  Rachel Huber is a 67 y.o. female presenting with hepatic encephalopathy. PMH significant for DM, CKD, NASH w/ cirrhosis, HLD, depression and anxiety . Pharmacy has been consulted to monitor and replace electrolytes.  Diet: Tube feeds at 50 cc/hr + free water 300 cc q4h (3L/day) Misc: Lactulose 30 g 4x/day  Goal of Therapy: Electrolytes WNL  Plan:  No electrolyte replacement at thsi time Follow-up electrolytes with AM labs tomorrow  Thank you for allowing pharmacy to be a part of this patient's care.  [name] A 08/16/2023 4:25 PM

## 2023-08-16 NOTE — Progress Notes (Signed)
Daily Progress Note   Patient Name: Rachel Huber       Date: 08/16/2023 DOB: 1955-10-14  Age: 67 y.o. 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    12/12: Patient has been excepting her lactulose and having multiple bowel movements.  Mental status remains poor 12/13: No clinical status changes.  Patient is a little bit more awake.  Has been taking her p.o. lactulose. 12/15: Mental status seems close to baseline.  Waiting for PT and OT eval 12/16: PT and OT recommends SNF.  Husband refusing to take her back to [facility].  TOC working on alternative placement 12/17: Patient refusing lactulose at times.  Ammonia trending up 12/18: Lactulose enema was given as an option yesterday for oral lactulose when she is not able to take.  Ammonia 94 12/19: Patient reportedly fell overnight/early morning and has new left intertrochanteric hip fracture.  Going for surgery/OR today 12/20 s/p left hip cephalmomedullary nail 12/22 GI has been consulted-recommendations for NGT placement to ensure administration of lactulose, liver US assessing  for ascites for potential paracentesis to assess for SBP--recommendations to assess for other sources of encephalopthy   Palliative Medicine consulted for assisting with goals of care conversations.  Subjective: Extensive chart review has been completed prior to meeting patient including labs, vital signs, imaging, progress notes, orders, and available advanced directive documents from current and previous encounters.    Visited with Ms. Cavanaugh at her bedside. She is resting in bed with eyes closed, able to open eyes with minimal interaction with calling of her name, NGT remains in place, she easily drifts back to sleep without redirection.  Returned to bedside to visit with husband-Michael, Dr. Nelson Chimes joined visit as well. Dr. Nelson Chimes provided medical updates, spoke with [name] regarding concerns related to long  term prognosis, concern related to current state of minimal responsiveness and need for PEG placement as well LTC placement. Dr. Nelson Chimes also discussed possibility of transitioning to comfort care and the importance of considering Ms. Mezquita's quality of life.  [name] requests time to discuss with his daughter as well as Ms. Cagley's sister before making a decision moving forward. Plan set to revisit tomorrow.  Care plan was discussed with nursing staff and primary team.  Thank you for allowing the Palliative Medicine Team to assist in the care of this patient.  Total time:  35 minutes  Time spent includes: Detailed review of medical records (labs, imaging, vital signs), medically appropriate exam (mental status, respiratory, cardiac, skin), discussed with treatment team, counseling and educating patient, family and staff, documenting clinical information, medication management and coordination of care.  Leeanne Deed, DNP, AGNP-C Palliative Medicine   Please contact Palliative Medicine Team phone at [phone] for questions and concerns.

## 2023-08-17 DIAGNOSIS — K7682 Hepatic encephalopathy: Secondary | ICD-10-CM | POA: Diagnosis not present

## 2023-08-17 DIAGNOSIS — S72142S Displaced intertrochanteric fracture of left femur, sequela: Secondary | ICD-10-CM | POA: Diagnosis not present

## 2023-08-17 DIAGNOSIS — Z515 Encounter for palliative care: Secondary | ICD-10-CM | POA: Diagnosis not present

## 2023-08-17 DIAGNOSIS — R4182 Altered mental status, unspecified: Secondary | ICD-10-CM | POA: Diagnosis not present

## 2023-08-17 LAB — GLUCOSE, CAPILLARY
Glucose-Capillary: 128 mg/dL — ABNORMAL HIGH (ref 70–99)
Glucose-Capillary: 126 mg/dL — ABNORMAL HIGH (ref 70–99)
Glucose-Capillary: 116 mg/dL — ABNORMAL HIGH (ref 70–99)
Glucose-Capillary: 96 mg/dL (ref 70–99)

## 2023-08-17 LAB — MAGNESIUM: Magnesium: 2.3 mg/dL (ref 1.7–2.4)

## 2023-08-17 LAB — CBC
HCT: 23.2 % — ABNORMAL LOW (ref 36.0–46.0)
Hemoglobin: 7.2 g/dL — ABNORMAL LOW (ref 12.0–15.0)
MCH: 26.6 pg (ref 26.0–34.0)
MCHC: 31 g/dL (ref 30.0–36.0)
MCV: 85.6 fL (ref 80.0–100.0)
Platelets: 129 10*3/uL — ABNORMAL LOW (ref 150–400)
RBC: 2.71 MIL/uL — ABNORMAL LOW (ref 3.87–5.11)
RDW: 20.5 % — ABNORMAL HIGH (ref 11.5–15.5)
WBC: 6 10*3/uL (ref 4.0–10.5)
nRBC: 0 % (ref 0.0–0.2)

## 2023-08-17 LAB — BASIC METABOLIC PANEL
Anion gap: 5 (ref 5–15)
BUN: 48 mg/dL — ABNORMAL HIGH (ref 8–23)
CO2: 27 mmol/L (ref 22–32)
Calcium: 9.3 mg/dL (ref 8.9–10.3)
Chloride: 112 mmol/L — ABNORMAL HIGH (ref 98–111)
Creatinine, Ser: 0.72 mg/dL (ref 0.44–1.00)
GFR, Estimated: >60 mL/min (ref >60)
Glucose, Bld: 150 mg/dL — ABNORMAL HIGH (ref 70–99)
Potassium: 5 mmol/L (ref 3.5–5.1)
Sodium: 144 mmol/L (ref 135–145)

## 2023-08-17 LAB — PHOSPHORUS: Phosphorus: 3 mg/dL (ref 2.5–4.6)

## 2023-08-17 MED ORDER — ONDANSETRON HCL 4 MG/2ML IJ SOLN
4.0000 mg | Freq: Four times a day (QID) | INTRAMUSCULAR | Status: DC | PRN
Start: 2023-08-17 — End: 2023-09-05

## 2023-08-17 MED ORDER — HALOPERIDOL 0.5 MG PO TABS: 0.5000 mg | ORAL_TABLET | ORAL | Status: DC | PRN

## 2023-08-17 MED ORDER — GLYCOPYRROLATE 0.2 MG/ML IJ SOLN: 0.2000 mg | INTRAMUSCULAR | Status: DC | PRN

## 2023-08-17 MED ORDER — BIOTENE DRY MOUTH MT LIQD: 15.0000 mL | OROMUCOSAL | Status: DC | PRN

## 2023-08-17 MED ORDER — HALOPERIDOL LACTATE 5 MG/ML IJ SOLN: 0.5000 mg | INTRAMUSCULAR | Status: DC | PRN

## 2023-08-17 MED ORDER — POLYVINYL ALCOHOL 1.4 % OP SOLN: 1.0000 [drp] | Freq: Four times a day (QID) | OPHTHALMIC | Status: DC | PRN

## 2023-08-17 MED ORDER — ONDANSETRON 4 MG PO TBDP: 4.0000 mg | ORAL_TABLET | Freq: Four times a day (QID) | ORAL | Status: DC | PRN

## 2023-08-17 MED ORDER — HYDROMORPHONE HCL 1 MG/ML IJ SOLN
1.0000 mg | INTRAMUSCULAR | Status: DC | PRN
  Administered 2023-08-17 – 2023-09-04 (×21): 1 mg via INTRAVENOUS
  Filled 2023-08-17 (×21): qty 1

## 2023-08-17 MED ORDER — GLYCOPYRROLATE 1 MG PO TABS: 1.0000 mg | ORAL_TABLET | ORAL | Status: DC | PRN

## 2023-08-17 MED ORDER — HALOPERIDOL LACTATE 2 MG/ML PO CONC: 0.5000 mg | ORAL | Status: DC | PRN

## 2023-08-17 NOTE — Consult Note (Signed)
PHARMACY CONSULT NOTE - ELECTROLYTES  Pharmacy Consult for Electrolyte Monitoring and Replacement   Recent Labs: Height: 5\' 9"  (175.3 cm) Weight: 92.7 kg (204 lb 5.9 oz) IBW/kg (Calculated) : 66.2 Estimated Creatinine Clearance: 85 mL/min (by C-G formula based on SCr of 0.72 mg/dL). Potassium (mmol/L)  Date Value  08/17/2023 5.0   Magnesium (mg/dL)  Date Value  16/05/9603 2.3   Calcium (mg/dL)  Date Value  54/04/8118 9.3   Albumin (g/dL)  Date Value  14/78/2956 2.5 (L)   Phosphorus (mg/dL)  Date Value  21/30/8657 3.0   Sodium (mmol/L)  Date Value  08/17/2023 144   Assessment  Rachel Huber is a 67 y.o. female presenting with hepatic encephalopathy. PMH significant for DM, CKD, NASH w/ cirrhosis, HLD, depression and anxiety . Pharmacy has been consulted to monitor and replace electrolytes.  Diet: Tube feeds at 50 cc/hr + free water 300 cc q4h (3L/day) Misc: Lactulose 30 g 4x/day  Goal of Therapy: Electrolytes WNL  Plan:  No electrolyte replacement at thsi time Follow-up electrolytes with AM labs tomorrow  Thank you for allowing pharmacy to be a part of this patient's care.  Kinga Cassar Rodriguez-Guzman PharmD, BCPS 08/17/2023 8:37 AM

## 2023-08-17 NOTE — Progress Notes (Signed)
Palliative Care Progress Note, Assessment & Plan   Patient Name: Rachel Huber       Date: 08/17/2023 DOB: [birth year]  Age: 67 y.o. MRN#: [id number] Attending Physician: Arnetha Courser, MD Primary Care Physician: Lauro Regulus, MD Admit Date: 07/28/2023  Subjective: Patient sitting up in bed with NG tube in place.  She is asleep and does not awaken to voice or light touch.  She does not acknowledge my presence and is not able to make her wishes known.  No family or friends present during my initial visit.  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    12/12: Patient has been excepting her lactulose and having multiple bowel movements.  Mental status remains poor 12/13: No clinical status changes.  Patient is a little bit more awake.  Has been taking her p.o. lactulose. 12/15: Mental status seems close to baseline.  Waiting for PT and OT eval 12/16: PT and OT recommends SNF.  Husband refusing to take her back to [facility].  TOC working on alternative placement 12/17: Patient refusing lactulose at times.  Ammonia trending up 12/18: Lactulose enema was given as an option yesterday for oral lactulose when she is not able to take.  Ammonia 94 12/19: Patient reportedly fell overnight/early morning and has new left intertrochanteric hip fracture.   Going for surgery/OR today 12/20 s/p left hip cephalmomedullary nail 12/22 GI has been consulted-recommendations for NGT placement to ensure administration of lactulose, liver US assessing for ascites for potential paracentesis to assess for SBP--recommendations to assess for other sources of encephalopthy   Palliative Medicine consulted for assisting with goals of care conversations.  Summary of counseling/coordination of care: Extensive chart review completed prior to meeting patient including labs, vital signs, imaging, progress notes, orders, and available advanced directive documents from current and previous encounters.   After reviewing the patient's chart and assessing the patient at bedside, I spoke with attending in regards to plan of care.  She met with patient's husband and sister over the weekend.  They wanted time to consider their options.  I followed up with patient's husband [name] over the phone.  He asked to meet with attending in person.  I coordinated a meeting with patient, patient's husband, patient's sister, pateitn's stepdaughter, and attending Dr. Nelson Chimes.  Dr. Nelson Chimes outlined patient's current medical situation.  Discussed end-stage liver disease with continued encephalopathy despite correcting ammonia levels.  Patient has shown minimal signs of recovery.  I attempted to elicit values and goals important to patient and family.  Patient's husband [name] shares that patient would never want to live in a nursing home and require 24/7 nursing care.  He shares that discussions in the past have reflected that she would never want resuscitative measures or ventilatory support if it was not going to help her.  He believes  is not going to help her now.  Patient's husband and sister are in agreement I believe patient is approaching end-of-life.  They discussed not wanting her to be in any pain or discomfort.  We discussed comfort measures. Arian would no longer receive aggressive medical  interventions such as continuous vital signs, lab work, radiology testing, or medications not focused on comfort. All care would focus on how she is looking and feeling. This would include management of any symptoms that may cause discomfort, pain, shortness of breath, cough, nausea, agitation, anxiety, and/or secretions etc. Symptoms would be managed with medications and other non-pharmacological interventions such as spiritual support if requested, repositioning, music therapy, or therapeutic listening.   Family verbalized understanding and appreciation. They are all in agreement to shift to full comfort measures.   Family shares they like patient to be evaluated for inpatient placement from [facility] hospice services as they have had a good experience with this group in the past.    RN made aware of shift to full comfort measures.  TOC made aware of family's wishes to shift to full comfort and be evaluated for hospice services.  Dr. Nelson Chimes and I remain in agreement to watch patient overnight to see how stable she is once full comfort measures are enacted.  Spiritual care support offered and accepted.  Spiritual care consult placed for end-of-life support.  PMT will continue to follow and support patient and family throughout her hospitalization.  Physical Exam Constitutional:      General: She is not in acute distress.    Appearance: She is normal weight. She is ill-appearing.  HENT:     Head: Normocephalic.     Mouth/Throat:     Mouth: Mucous membranes are moist.  Pulmonary:     Effort: Pulmonary effort is normal.  Abdominal:     Palpations: Abdomen is soft.  Musculoskeletal:     Comments: Does not move extremities to command, generalized weakness  Skin:    General: Skin is warm.     Coloration: Skin is jaundiced.  Psychiatric:        Behavior: Behavior normal.             Total Time 50 minutes   Time spent includes: Detailed review of medical records (labs, imaging, vital  signs), medically appropriate exam (mental status, respiratory, cardiac, skin), discussed with treatment team, counseling and educating patient, family and staff, documenting clinical information, medication management and coordination of care.  Samara Deist L. Bonita Quin, DNP, FNP-BC Palliative Medicine Team

## 2023-08-17 NOTE — Progress Notes (Signed)
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  Patient was diagnosed with hepatic encephalopathy, was given lactulose and IV fluids.  Patient mental status has been waxing and waning.  Patient had a fall while in the hospital, sustained a new left intertrochanteric hip fracture, had intertrochanteric nail performed on 12/19. 12/21.  Patient became unresponsive, ammonia level went up to more than 400, NG tube was placed, patient was given lactulose.  Received 2 units of PRBC. 12/22.  Patient became more responsive today, still very confused. 12/23: Patient remained quite lethargic, not following any commands and appears significantly dry with sodium of 152-started on D5 with hypotonic saline.  High risk for refeeding syndrome so close monitoring of electrolytes required.  Hemoglobin at 7.3, anemia panel consistent with anemia of chronic disease.  Having bowel movements.  High risk for deterioration and mortality-palliative care is on board but currently patient remained full code.  12/24: Patient remained pretty much unresponsive, minimum response to painful stimuli, worsening hypernatremia and hyperglycemia since she was started on tube feed.  Increasing free water, D5 and adding SSI. Family remain very optimistic for her full recovery and want everything to be  done.  12/25: No change in clinical status.  Slight improvement in sodium to 153, CBG elevated, continuing D5 and increasing the dose of NovoLog to 6 unit, although increasing free water intake through NG tube.  12/26; patient open eyes and make a very short eye contact when called her name today.  Not following any other commands.  Hypernatremia resolved.  Stopping IV fluid and we will continue with free water during tube feed. Hemoglobin decreased to 6.7 so ordered 1 unit of PRBC.  Worsening thrombocytopenia.  CBG remained elevated but now we are stopping D5 hopefully that will improve.  12/27: Labs and vital stable, no change in mental status, remained on tube feed.  Started conversation regarding PEG placement if she has to remain full scope of care.  12/28: No significant change in mental status.  Family seems interested in PEG tube placement.  Hemoglobin decreased to 6.7-ordered 1 unit of PRBC.  12/29: Hemoglobin improved to 7.1 after getting 1 unit of PRBC.  Again developing hypernatremia-increasing the frequency of free water through NG tube.  Lengthy discussion with husband along with palliative care at bedside, he was asking for PEG tube.  We discussed the logistics so he agreed to check her benefit for LTC as he will not be able to take care of her at home.  Patient likely have permanent damage at this point as there is no change in her encephalopathy.  Husband is also going to discuss with their children and other family member and then let us know about their plan.  12/30: Hemodynamically stable but remained encephalopathic with no change in mental status.  Another discussion with family with palliative care and they decided to transition her to full comfort care. Comfort measures initiated and  hospice consult was placed.  Family wants her to go to inpatient hospice facility.   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 Suspect  secondary to patient refusal to take lactulose at nursing facility Patient has been treated with lactulose, ammonia level seem to be improving.  Peaked at 643.  However, patient has a worsening mental status , she became unresponsive. Due to altered mental status, patient has not been able to take lactulose  GI consult was obtained, lactulose was given after NG tube was placed.  So far blood culture has been negative,  no need for additional prophylactic treatment with Rocephin. Due to altered mental status, not safe for oral intake, started on tube feed. Her MRI of the brain in September 2024 showed - 2.2 x 1.5 cm heavily calcified meningioma in the right middle cranial fossa with some mass effect on the lateral aspect of the right temporal tip. MRI brain is unchanged from before Patient long-term prognosis is very poor, palliative care is following.  Remained encephalopathic.  Family now decided to transition her to full comfort care. -Comfort measures initiated. -Continue with supportive care  Hypernatremia.  Currently resolved. Patient is now full comfort care   Anemia in chronic kidney disease (CKD) Hemoglobin at 7.2 after getting fourth unit yesterday. -No more labs as she is now full comfort care.    Acute on chronic kidney disease, stage 3b (HCC) Mild metabolic acidosis. Hypokalemia. Hypomagnesemia. Hypophosphatemia Renal function and potassium improved today,     Acute left intertrochanteric hip fracture Status post fall on 12/19 around 4 AM.  Per report she was trying to get out of the bed by herself to go to the bathroom.   Underwent head CT which was negative for acute findings. Left hip x-ray showed acute fracture -status post left hip cephalomedullary nail in place orthopedics on 12/19 Pain medicine as needed as ordered Aspirin for DVT prophylaxis Weightbearing as tolerated on the left hip per orthopedic.   Obesity class I. Estimated body mass index is 30.18 kg/m as  calculated from the following:   Height as of this encounter: 5\' 9"  (1.753 m).   Weight as of this encounter: 92.7 kg.  Complicating factor to overall care and prognosis Continue CPAP while asleep.      Subjective:  Patient remained encephalopathic and not following any commands.  Physical Exam: Vitals:   08/16/23 1633 08/16/23 2021 08/17/23 0416 08/17/23 0755  BP: (!) 117/43 (!) 127/46 (!) 121/46 (!) 125/48  Pulse: 71 74 76 77  Resp: 18 15 16    Temp: 97.9 F (36.6 C) 98.9 F (37.2 C) 99.9 F (37.7 C)   TempSrc:      SpO2: 100% 99% 99% 100%  Weight:      Height:       General.  Chronically ill-appearing lady, in no acute distress. Pulmonary.  Lungs clear bilaterally, normal respiratory effort. CV.  Regular rate and rhythm, no JVD, rub or murmur. Abdomen.  Soft, nontender, nondistended, BS positive. CNS.  Encephalopathic and not following any commands Extremities.  No edema, no cyanosis, pulses intact and symmetrical.  Data Reviewed:  Lab results reviewed.  Family Communication: Discussed with husband, sister and a Museum/gallery conservator.  Patient is being transitioned to full comfort care now.  Disposition: Status is: Inpatient Remains inpatient appropriate because: Severity of disease,  She need evaluation for inpatient hospice  DVT Phrophylaxis. heparin Time spent:50 minutes  This record has been created using Conservation officer, historic buildings. Errors  have been sought and corrected,but may not always be located. Such creation errors do not reflect on the standard of care.   Author: Arnetha Courser, MD 08/17/2023 2:28 PM  For on call review [email or web address].

## 2023-08-17 NOTE — Plan of Care (Signed)

## 2023-08-17 NOTE — Progress Notes (Signed)
 Nutrition Brief Note  Chart reviewed. Pt now transitioning to comfort care.  No further nutrition interventions planned at this time.  Please re-consult as needed.   Levada Schilling, RD, LDN, CDCES Registered Dietitian III Certified Diabetes Care and Education Specialist If unable to reach this RD, please use "RD Inpatient" group chat on secure chat between hours of 8am-4 pm daily

## 2023-08-17 NOTE — Plan of Care (Signed)
  Problem: Education: Goal: Knowledge of General Education information will improve Description: Including pain rating scale, medication(s)/side effects and non-pharmacologic comfort measures Outcome: Not Progressing   Problem: Health Behavior/Discharge Planning: Goal: Ability to manage health-related needs will improve Outcome: Not Progressing   Problem: Clinical Measurements: Goal: Ability to maintain clinical measurements within normal limits will improve Outcome: Not Progressing Goal: Will remain free from infection Outcome: Not Progressing Goal: Diagnostic test results will improve Outcome: Not Progressing

## 2023-08-18 DIAGNOSIS — Z515 Encounter for palliative care: Secondary | ICD-10-CM | POA: Diagnosis not present

## 2023-08-18 DIAGNOSIS — K7682 Hepatic encephalopathy: Secondary | ICD-10-CM | POA: Diagnosis not present

## 2023-08-18 DIAGNOSIS — R4182 Altered mental status, unspecified: Secondary | ICD-10-CM | POA: Diagnosis not present

## 2023-08-18 DIAGNOSIS — S72142S Displaced intertrochanteric fracture of left femur, sequela: Secondary | ICD-10-CM | POA: Diagnosis not present

## 2023-08-18 MED ORDER — SODIUM CHLORIDE 0.9% FLUSH
10.0000 mL | Freq: Two times a day (BID) | INTRAVENOUS | Status: DC
  Administered 2023-08-18: 20 mL
  Administered 2023-08-18 – 2023-08-31 (×26): 10 mL
  Administered 2023-08-31 – 2023-09-01 (×2): 20 mL
  Administered 2023-09-01 – 2023-09-02 (×3): 10 mL
  Administered 2023-09-03: 20 mL
  Administered 2023-09-03 – 2023-09-04 (×2): 10 mL

## 2023-08-18 MED ORDER — SODIUM CHLORIDE 0.9% FLUSH
10.0000 mL | INTRAVENOUS | Status: DC | PRN
  Administered 2023-08-18: 10 mL

## 2023-08-18 MED ORDER — CHLORHEXIDINE GLUCONATE CLOTH 2 % EX PADS: 6.0000 | MEDICATED_PAD | Freq: Every day | CUTANEOUS | Status: DC

## 2023-08-18 MED ORDER — LACTULOSE 10 GM/15ML PO SOLN
30.0000 g | Freq: Two times a day (BID) | ORAL | Status: DC
  Administered 2023-08-18: 30 g via ORAL
  Filled 2023-08-18 (×4): qty 60

## 2023-08-18 NOTE — Progress Notes (Signed)
 Progress Note   Patient: Rachel Huber FMW:[id number] DOB: [birth year] DOA: 07/28/2023     21 DOS: the patient was seen and examined on 08/18/2023   Brief hospital course: 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  Patient was diagnosed with hepatic encephalopathy, was given lactulose  and IV fluids.  Patient mental status has been waxing and waning.  Patient had a fall while in the hospital, sustained a new left intertrochanteric hip fracture, had intertrochanteric nail performed on 12/19. 12/21.  Patient became unresponsive, ammonia level went up to more than 400, NG tube was placed, patient was given lactulose .  Received 2 units of PRBC. 12/22.  Patient became more responsive today, still very confused. 12/23: Patient remained quite lethargic, not following any commands and appears significantly dry with sodium of 152-started on D5 with hypotonic saline.  High risk for refeeding syndrome so close monitoring of electrolytes required.  Hemoglobin at 7.3, anemia panel consistent with anemia of chronic disease.  Having bowel movements.  High risk for deterioration and mortality-palliative care is on board but currently patient remained full code.  12/24: Patient remained pretty much unresponsive, minimum response to painful stimuli, worsening hypernatremia and hyperglycemia since she was started on tube feed.  Increasing free water , D5 and adding SSI. Family remain very optimistic for her full recovery and want everything to be  done.  12/25: No change in clinical status.  Slight improvement in sodium to 153, CBG elevated, continuing D5 and increasing the dose of NovoLog  to 6 unit, although increasing free water  intake through NG tube.  12/26; patient open eyes and make a very short eye contact when called her name today.  Not following any other commands.  Hypernatremia resolved.  Stopping IV fluid and we will continue with free water  during tube feed. Hemoglobin decreased to 6.7 so ordered 1 unit of PRBC.  Worsening thrombocytopenia.  CBG remained elevated but now we are stopping D5 hopefully that will improve.  12/27: Labs and vital stable, no change in mental status, remained on tube feed.  Started conversation regarding PEG placement if she has to remain full scope of care.  12/28: No significant change in mental status.  Family seems interested in PEG tube placement.  Hemoglobin decreased to 6.7-ordered 1 unit of PRBC.  12/29: Hemoglobin improved to 7.1 after getting 1 unit of PRBC.  Again developing hypernatremia-increasing the frequency of free water  through NG tube.  Lengthy discussion with husband along with palliative care at bedside, he was asking for PEG tube.  We discussed the logistics so he agreed to check her benefit for LTC as he will not be able to take care of her at home.  Patient likely have permanent damage at this point as there is no change in her encephalopathy.  Husband is also going to discuss with their children and other family member and then let us  know about their plan.  12/30: Hemodynamically stable but remained encephalopathic with no change in mental status.  Another discussion with family with palliative care and they decided to transition her to full comfort care. Comfort measures initiated and  hospice consult was placed.  Family wants her to go to inpatient hospice facility.  12/31: Hemodynamically stable, sudden change in mental status and now more alert and answering some questions.   Following commands.  Recognizing family member and wants to eat something. Restarted lactulose , we will observe for another day and if continue to improve then family might revoked full comfort care status.   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 Suspect secondary to patient refusal to take lactulose  at nursing facility Patient has been treated with lactulose , ammonia level seem to be improving.  Peaked at 643.  However, patient has a worsening mental status , she became unresponsive. Due to altered mental status, patient has not been able to take lactulose   GI consult was obtained, lactulose  was given after NG tube was placed.  So far blood culture has been negative,  no need for additional prophylactic treatment with Rocephin . Due to altered mental status, not safe for oral intake, started on tube feed. Her MRI of the brain in September 2024 showed - 2.2 x 1.5 cm heavily calcified meningioma in the right middle cranial fossa with some mass effect on the lateral aspect of the right temporal tip. MRI brain is unchanged from before Patient long-term prognosis is very poor, palliative care is following.   Sudden change in encephalopathy and now is becoming more alert, talking and wants to eat. She was transitioned to full comfort care yesterday, increased adding home lactulose  and observing for another day as family might revoked full comfort care status if she continued to improve -Continue with supportive care  Hypernatremia.  Currently resolved. Patient is now full comfort care   Anemia in chronic kidney disease (CKD) Hemoglobin at 7.2 after getting fourth unit yesterday. -No more labs as she is now full comfort care. -We will start monitoring again if family decided to revoked comfort care status   Acute on chronic kidney disease, stage 3b (HCC) Mild metabolic acidosis. Hypokalemia. Hypomagnesemia. Hypophosphatemia Renal function  and potassium improved today,     Acute left intertrochanteric hip fracture Status post fall on 12/19 around 4 AM.  Per report she was trying to get out of the bed by herself to go to the bathroom.   Underwent head CT which was negative for acute findings. Left hip x-ray showed acute fracture -status post left hip cephalomedullary nail in place orthopedics on 12/19 Pain medicine as needed as ordered Aspirin  for DVT prophylaxis Weightbearing as tolerated on the left hip per orthopedic.   Obesity class I. Estimated body mass index is 30.18 kg/m as calculated from the following:   Height as of this encounter: 5' 9 (1.753 m).   Weight as of this encounter: 92.7 kg.  Complicating factor to overall care and prognosis Continue CPAP while asleep.      Subjective:  Patient was more alert and interactive, answering simple questions and following commands.  She was able to recognize family member at bedside, still now orientation to place and time.  Physical Exam: Vitals:   08/17/23 0416 08/17/23 0755 08/17/23 1924 08/18/23 0525  BP: (!) 121/46 (!) 125/48 (!) 112/44 (!) 122/110  Pulse: 76 77 71 70  Resp: 16  19 18   Temp: 99.9 F (37.7 C)  98.5 F (36.9 C) (!) 97.5 F (36.4 C)  TempSrc:      SpO2: 99% 100% 100% 100%  Weight:      Height:       General.  Chronically ill-appearing  lady, in no acute distress. Pulmonary.  Lungs clear bilaterally, normal respiratory effort. CV.  Regular rate and rhythm, no JVD, rub or murmur. Abdomen.  Soft, nontender, nondistended, BS positive. CNS.  Alert and oriented to self.  No focal neurologic deficit. Extremities.  No edema, no cyanosis, pulses intact and symmetrical.  Data Reviewed:  Lab results reviewed.  Family Communication: Discussed with husband and stepdaughter at bedside  Disposition: Status is: Inpatient Remains inpatient appropriate because: Severity of disease,  She need evaluation for inpatient hospice  DVT Phrophylaxis.  heparin  Time spent:45 minutes  This record has been created using Conservation officer, historic buildings. Errors have been sought and corrected,but may not always be located. Such creation errors do not reflect on the standard of care.   Author: Amaryllis Dare, MD 08/18/2023 4:12 PM  For on call review [email or web address].

## 2023-08-18 NOTE — TOC Progression Note (Signed)
 Transition of Care [facility]) - Progression Note    Patient Details  Name: Rachel Huber MRN: [id number] Date of Birth: 1955/08/24  Transition of Care [facility]) CM/SW Contact  Ladene Lady, LCSW Phone Number: 08/18/2023, 12:19 PM  Clinical Narrative Per palliative, family would like [facility] to eval for inpatient hospice. Referral give to jack.        Expected Discharge Plan: Skilled Nursing Facility Barriers to Discharge: Continued Medical Work up  Expected Discharge Plan and Services                                               Social Determinants of Health (SDOH) Interventions SDOH Screenings   Food Insecurity: Patient Unable To Answer (07/29/2023)  Housing: Patient Unable To Answer (07/29/2023)  Transportation Needs: Patient Unable To Answer (07/29/2023)  Utilities: Patient Unable To Answer (07/29/2023)  Alcohol  Screen: Low Risk  (04/04/2022)  Depression (PHQ2-9): Low Risk  (04/27/2023)  Financial Resource Strain: Low Risk  (06/03/2023)   Received from [facility]  Physical Activity: Inactive (04/04/2022)  Social Connections: Patient Unable To Answer (08/18/2023)  Stress: No Stress Concern Present (04/04/2022)  Tobacco Use: Medium Risk (07/28/2023)    Readmission Risk Interventions    04/30/2023   12:35 PM 12/19/2022   10:27 AM 11/04/2022   11:32 AM  Readmission Risk Prevention Plan  Transportation Screening Complete Complete Complete  Medication Review Oceanographer) Complete Complete Complete  PCP or Specialist appointment within 3-5 days of discharge Not Complete Complete Complete  HRI or Home Care Consult Complete Complete   SW Recovery Care/Counseling Consult Complete Complete Complete  Palliative Care Screening Not Applicable Not Applicable Not Applicable  Skilled Nursing Facility Not Complete Not Applicable Not Applicable

## 2023-08-18 NOTE — Progress Notes (Signed)
 Palliative Care Progress Note, Assessment & Plan   Patient Name: Rachel Huber       Date: 08/18/2023 DOB: 1955-12-25  Age: 67 y.o. MRN#: [id number] Attending Physician: Caleen Qualia, MD Primary Care Physician: Lenon Layman ORN, MD Admit Date: 07/28/2023  Subjective: Patient is lying in bed, awake and alert.  She acknowledges my presence.  She is able to make her wishes known but is still somewhat confused.  Her sister and her niece are at bedside during my visit.  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    12/12: Patient has been excepting her lactulose  and having multiple bowel movements.  Mental status remains poor 12/13: No clinical status changes.  Patient is a little bit more awake.  Has been taking her p.o. lactulose . 12/15: Mental status seems close to baseline.  Waiting for PT and OT eval 12/16: PT and OT recommends SNF.  Husband refusing to take her back to [facility].  TOC working on alternative placement 12/17: Patient refusing lactulose  at times.  Ammonia trending up 12/18: Lactulose  enema was given as an option yesterday for oral lactulose  when she is not able to take.  Ammonia 94 12/19: Patient reportedly fell overnight/early morning and has new left intertrochanteric hip fracture.  Going for surgery/OR today 12/20 s/p left hip  cephalmomedullary nail 12/22 GI has been consulted-recommendations for NGT placement to ensure administration of lactulose , liver US  assessing for ascites for potential paracentesis to assess for SBP--recommendations to assess for other sources of encephalopthy 12/30 - family GOC discussion with attending Dr. Caleen - shifted to full comfort measures  Palliative Medicine consulted for assisting with goals of care conversations.  Summary of counseling/coordination of care: Extensive chart review completed prior to meeting patient including labs, vital signs, imaging, progress notes, orders, and available advanced directive documents from current and previous encounters.   After reviewing the patient's chart and assessing the patient at bedside, SCUF symptom management and plan of care with patient.  She complains that she does not want to pee down my leg.  Discussed use of pure wick and education provided that it should keep her dry should she need to urinate.  She seemed relieved with this information.    Patient has mittens on.  With family at bedside, I removed them for more comfort.  Advised family to notify RN and to replace mittens if they plan to leave patient alone.  Patient is requesting something for her dry mouth.  We discussed comfort feeds and taking small bites.  Patient is interested in having something to eat.  Counseled with dayshift RN and attending Dr. Caleen.  Advised comfort feeds if patient is awake, alert, and asking for food.  She has no other acute complaints at this time.  Plan remains for full comfort measures and for patient to be  evaluated for hospice inpatient placement.  Referral placed to [facility] with family's preference for [facility] hospice to evaluate.   PMT will continue to follow and support patient throughout her hospitalization.  Physical Exam Vitals reviewed.  Constitutional:      General: She is not in acute distress.    Appearance: She is normal weight.  She is ill-appearing.  HENT:     Nose: Nose normal.     Mouth/Throat:     Mouth: Mucous membranes are moist.  Eyes:     Pupils: Pupils are equal, round, and reactive to light.  Pulmonary:     Effort: Pulmonary effort is normal.  Skin:    General: Skin is warm and dry.     Coloration: Skin is jaundiced.  Neurological:     Mental Status: She is alert.     Comments: Oriented to self  Psychiatric:        Behavior: Behavior normal.        Judgment: Judgment normal.             Total Time 35 minutes   Time spent includes: Detailed review of medical records (labs, imaging, vital signs), medically appropriate exam (mental status, respiratory, cardiac, skin), discussed with treatment team, counseling and educating patient, family and staff, documenting clinical information, medication management and coordination of care.  Lamarr L. Arvid, DNP, FNP-BC Palliative Medicine Team

## 2023-08-19 DIAGNOSIS — R4182 Altered mental status, unspecified: Secondary | ICD-10-CM | POA: Diagnosis not present

## 2023-08-19 DIAGNOSIS — K7682 Hepatic encephalopathy: Secondary | ICD-10-CM | POA: Diagnosis not present

## 2023-08-19 DIAGNOSIS — Z515 Encounter for palliative care: Secondary | ICD-10-CM | POA: Diagnosis not present

## 2023-08-19 NOTE — Progress Notes (Signed)
 Progress Note  Patient: Rachel Huber FMW:[id number] DOB: 13-Jul-1956 DOA: 07/28/2023     22 DOS: the patient was seen and examined on 08/19/2023   Brief hospital course: 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  Presented with hepatic encephalopathy- treated with lactulose  and supportive care. Required NG tube administration due to obtunded states.  Prolonged course of metabolic and hemodynamic instability without poor prognosis. Was transitioned to comfort measures 12/30. Showed spontaneous improvement 12/31 as she became alert to voice.  Palliative continues to follow and family may ultimately decide to reverse the decision for comfort measures. Continue with lactulose  treatment.   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 Suspect secondary to patient refusal to take lactulose  at nursing facility Patient has been treated with lactulose , ammonia level seem to be improving.  Peaked at 643.  However, patient has a worsening mental status , she became unresponsive. Due to altered mental status, patient has not been able to take lactulose   GI consult was obtained, lactulose  was given after NG tube was placed.  So far blood culture has been negative,  no need for additional prophylactic treatment with Rocephin . Due to altered mental status, not safe for oral intake, started on tube feed. Her MRI of the brain in September 2024 showed - 2.2 x 1.5 cm heavily calcified meningioma in the right middle cranial fossa with some mass effect on the lateral aspect of the  right temporal tip. MRI brain is unchanged from before Patient long-term prognosis is very poor, palliative care is following.   Sudden change in encephalopathy and now is becoming more alert, talking and wants to eat. She was transitioned to full comfort care 12/30. -Continue with supportive care, lactulose  - follow up with palliative  Hypernatremia.  Currently resolved. Patient is now full comfort care   Anemia in chronic kidney disease (CKD) Hemoglobin at 7.2. s/p 4 units pRBCs -No more labs as she is now full comfort care. -We will start monitoring again if family decided to revoked comfort care status   Acute on chronic kidney disease, stage 3b (HCC) Mild metabolic acidosis. Hypokalemia. Hypomagnesemia. Hypophosphatemia - no longer monitoring while comfort care status   Acute left intertrochanteric hip fracture Status post fall on 12/19 around 4 AM. head CT which was negative for acute findings. Left hip x-ray showed acute fracture -status post left hip cephalomedullary nail in place orthopedics on 12/19 Pain medicine as needed as ordered Aspirin  for DVT prophylaxis Weightbearing as tolerated on the left hip per orthopedic.   Obesity class I. Estimated body mass index is 30.18 kg/m as calculated from the following:   Height as of this encounter: 5' 9 (1.753 m).   Weight as of this encounter: 92.7 kg.  Complicating factor to overall care and prognosis Continue CPAP while asleep.     Subjective:  Patient reports no concerns. She feels fine.   Physical Exam: Vitals:   08/17/23 1924 08/18/23 0525 08/18/23 1631 08/19/23 0437  BP: (!) 112/44 (!) 122/110 (!) 131/47 (!) 115/57  Pulse: 71 70 72 64  Resp: 19 18 15 16   Temp: 98.5 F (36.9 C) (!) 97.5 F (36.4  C) 98.3 F (36.8 C) 98.1 F (36.7 C)  TempSrc:   Oral Oral  SpO2: 100% 100% 94% 96%  Weight:      Height:       General.  Chronically ill-appearing, in no acute distress. Pulmonary.  Lungs clear  bilaterally, normal respiratory effort. CV.  Regular rate and rhythm, no JVD, rub or murmur. Abdomen.  Soft, nontender, nondistended, BS positive. CNS.  Alert and oriented to self.  No focal neurologic deficit. Extremities.  No edema, no cyanosis, pulses intact and symmetrical.  Data Reviewed:  Lab results reviewed.  Family Communication: none at bedside  Disposition: Status is: Inpatient Remains inpatient appropriate because: Severity of disease,  She need evaluation for inpatient hospice  DVT Phrophylaxis. heparin  Time spent:45 minutes  Author: Marien LITTIE Piety, MD 08/19/2023 7:35 AM  For on call review [email or web address].

## 2023-08-19 NOTE — Plan of Care (Signed)
  Problem: Education: Goal: Knowledge of General Education information will improve Description: Including pain rating scale, medication(s)/side effects and non-pharmacologic comfort measures Outcome: Progressing   Problem: Clinical Measurements: Goal: Ability to maintain clinical measurements within normal limits will improve Outcome: Progressing   Problem: Activity: Goal: Risk for activity intolerance will decrease Outcome: Progressing   Problem: Pain Management: Goal: General experience of comfort will improve Outcome: Progressing

## 2023-08-19 NOTE — Progress Notes (Signed)
 Palliative Care Progress Note, Assessment & Plan   Patient Name: Rachel Huber       Date: 08/19/2023 DOB: [birth year]  Age: 68 y.o. MRN#: [id number] Attending Physician: Lenon Marien CROME, MD Primary Care Physician: Lenon Layman ORN, MD Admit Date: 07/28/2023  Subjective: Patient is sitting up in bed.  She is asleep but easily awakens to my presence.  She acknowledges my presence and is able to make her wishes known.  Her responses are delayed and intermittently appropriate.  No family or friends present during my visit.  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    12/12: Patient has been excepting her lactulose  and having multiple bowel movements.  Mental status remains poor 12/13: No clinical status changes.  Patient is a little bit more awake.  Has been taking her p.o. lactulose . 12/15: Mental status seems close to baseline.  Waiting for PT and OT eval 12/16: PT and OT recommends SNF.  Husband refusing to take her back to [facility].  TOC working on alternative placement 12/17: Patient refusing lactulose  at times.  Ammonia trending up 12/18: Lactulose  enema was given as an option yesterday for oral lactulose  when she is not able to take.  Ammonia 94 12/19: Patient reportedly fell overnight/early morning and has new left intertrochanteric hip  fracture.  Going for surgery/OR today 12/20 s/p left hip cephalmomedullary nail 12/22 GI has been consulted-recommendations for NGT placement to ensure administration of lactulose , liver US  assessing for ascites for potential paracentesis to assess for SBP--recommendations to assess for other sources of encephalopthy 12/30 - family GOC discussion with attending Dr. Caleen - shifted to full comfort measures   Palliative Medicine consulted for assisting with goals of care conversations.  Summary of counseling/coordination of care: Extensive chart review completed prior to meeting patient including labs, vital signs, imaging, progress notes, orders, and available advanced directive documents from current and previous encounters.   After reviewing the patient's chart and assessing the patient at bedside, I spoke with patient in regards to symptom management and plan of care.  She endorses that she is not in pain or discomfort.  She strongly dislikes having the oxygen in her nose and the mitts on her hands.  Mittens removed.  Supplemental oxygen removed.  Discussed that patient remains on full comfort measures which means interventions only focused on keeping her comfortable should remain.  She said thank you.  I discussed that we are no longer doing aggressive measures such as giving lactulose  or drawing labs.  She says she absolutely does not ever want to take lactulose  again.  She shares that keeping her comfortable sounds really good.  After meeting with patient, I counseled with dayshift RN.  Conveyed that mitts should not be replaced and that supplemental oxygen should only be given at 2 L or less if patient  shows signs of shortness of breath or air hunger.  For feeds and n.p.o. order were still in place.  Attending discontinued n.p.o. order.  Discussed that if patient is awake, alert, and asking for food/drink, she should be given what ever sounds good to her.  RN aware of order for comfort feeds  from floor stock.  I spoke with patient's wife Rachel Huber over the phone and conveyed the above discussion with patient and nursing.  He was appreciative of the update and grateful that the mittens will be removed.  We discussed that patient still needs to be evaluated for hospice inpatient unit, though I conveyed I am not sure if she meets the criteria at this time.  He shares he is not heard from hospice today.  Hospice eval pending.  PMT remains available to patient and family throughout her hospitalization.  Full comfort measures continue.  Physical Exam Vitals reviewed.  Constitutional:      General: She is not in acute distress. HENT:     Head: Normocephalic.     Mouth/Throat:     Mouth: Mucous membranes are moist.  Eyes:     Pupils: Pupils are equal, round, and reactive to light.  Pulmonary:     Effort: Pulmonary effort is normal.  Abdominal:     Palpations: Abdomen is soft.  Skin:    General: Skin is warm and dry.  Neurological:     Mental Status: She is alert.     Comments: Oriented to self  Psychiatric:        Mood and Affect: Mood normal.        Behavior: Behavior normal.        Judgment: Judgment normal.             Total Time 50 minutes   Time spent includes: Detailed review of medical records (labs, imaging, vital signs), medically appropriate exam (mental status, respiratory, cardiac, skin), discussed with treatment team, counseling and educating patient, family and staff, documenting clinical information, medication management and coordination of care.  Lamarr L. Arvid, DNP, FNP-BC Palliative Medicine Team

## 2023-08-20 DIAGNOSIS — K7682 Hepatic encephalopathy: Secondary | ICD-10-CM | POA: Diagnosis not present

## 2023-08-20 DIAGNOSIS — R4182 Altered mental status, unspecified: Secondary | ICD-10-CM | POA: Diagnosis not present

## 2023-08-20 DIAGNOSIS — Z515 Encounter for palliative care: Secondary | ICD-10-CM | POA: Diagnosis not present

## 2023-08-20 NOTE — Progress Notes (Signed)
 [facility] Liaison Note  Reviewed note from PMT provider Lamarr Gunner, NP regarding continued comfort care and hospice services. Discussion with Lamarr which confirms comfort focused care, however, she feels patient may not be appropriate for IPU at this time and confirmed with spouse home with hospice.  Phone call to [name], spouse, who shares with me again today that comfort care is not the focus, lactulose  has been restarted, and he did not agree to hospice services at home.  Discussed phone call with Lamarr. Seeking clarification from Dr. Lenon as patients spouse appears to be sharing different information with different providers.  [facility] will await further follow-up for discharge disposition.  Please call with any hospice related questions or concerns.  Thank you, Randine Nail, BSN, [facility] Liaison [phone]

## 2023-08-20 NOTE — Progress Notes (Signed)
 [facility] Liaison Note  Received request from Ladene Lady, SW with [facility] for evaluation for Hospice Home.   Phone call to spouse, [name], who relays they have taken patient off of comfort care and restarted medications as patient has woken up.  Per Dr. Rebbecca note, will continue to watch.  Notified Darrian Shoffner with TOC to re-consult [facility] Liaison should further hospice evaluation needed.  Thank you for the opportunity to participate in this patients care.  Randine Nail, BSN, [facility] Liaison [phone]

## 2023-08-20 NOTE — Progress Notes (Signed)
 [facility] Liaison Note  Attending MD reported that spouse is now on board with full comfort measures.  He is requesting that patient be evaluated for the Hospice Home.   Chart reviewed and spoke with spouse  to acknowledge referral.  Hospice eligibility confirmed.  Unfortunately, [facility] does not have bed availability today. Family and Transitions of Care Manager aware hospital liaison will follow up tomorrow or sooner if bed becomes available.   Please do not hesitate to call with any Hospice related questions. Thank you  [facility] Liaison [phone]

## 2023-08-20 NOTE — Plan of Care (Signed)
  Problem: Education: Goal: Knowledge of General Education information will improve Description: Including pain rating scale, medication(s)/side effects and non-pharmacologic comfort measures Outcome: Progressing   Problem: Health Behavior/Discharge Planning: Goal: Ability to manage health-related needs will improve Outcome: Progressing   Problem: Clinical Measurements: Goal: Ability to maintain clinical measurements within normal limits will improve Outcome: Progressing Goal: Will remain free from infection Outcome: Progressing Goal: Diagnostic test results will improve Outcome: Progressing Goal: Respiratory complications will improve Outcome: Progressing Goal: Cardiovascular complication will be avoided Outcome: Progressing   Problem: Health Behavior/Discharge Planning: Goal: Ability to manage health-related needs will improve Outcome: Progressing   Problem: Activity: Goal: Risk for activity intolerance will decrease Outcome: Progressing   Problem: Elimination: Goal: Will not experience complications related to bowel motility Outcome: Progressing Goal: Will not experience complications related to urinary retention Outcome: Progressing   Problem: Pain Management: Goal: General experience of comfort will improve Outcome: Progressing

## 2023-08-20 NOTE — Plan of Care (Signed)
  Problem: Education: Goal: Knowledge of General Education information will improve Description: Including pain rating scale, medication(s)/side effects and non-pharmacologic comfort measures Outcome: Progressing   Problem: Clinical Measurements: Goal: Ability to maintain clinical measurements within normal limits will improve Outcome: Progressing Goal: Cardiovascular complication will be avoided Outcome: Progressing   Problem: Nutrition: Goal: Adequate nutrition will be maintained Outcome: Progressing

## 2023-08-20 NOTE — Progress Notes (Signed)
 Progress Note  Patient: Rachel Huber FMW:[id number] DOB: January 20, 1956 DOA: 07/28/2023     23 DOS: the patient was seen and examined on 08/20/2023   Brief hospital course: Ms. 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  Presented with hepatic encephalopathy- treated with lactulose  and supportive care. Required NG tube administration due to obtunded states.  Prolonged course of metabolic and hemodynamic instability without poor prognosis. Was transitioned to comfort measures 12/30. Showed spontaneous improvement 12/31 as she became alert to voice.  She remains stable on comfort care and family elects to pursue hospice house. She has been refusing her lactulose  so it was discontinued.   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 Suspect secondary to patient refusal to take lactulose  at nursing facility Patient has been treated with lactulose , ammonia level seem to be improving.  Peaked at 643.  However, patient has a worsening mental status , she became unresponsive. Due to altered mental status, patient has not been able to take lactulose   GI consult was obtained, lactulose  was given after NG tube was placed.  So far blood culture has been negative,  no need for additional prophylactic treatment with Rocephin . Due to altered mental status, not safe for oral intake, started on tube feed. Her MRI of the brain in September 2024 showed - 2.2 x 1.5 cm heavily calcified meningioma in the right middle cranial fossa with some mass effect on the lateral aspect of the right  temporal tip. MRI brain is unchanged from before Patient long-term prognosis is very poor, palliative care is following.  Hospice consulted.  She was transitioned to full comfort care 12/30. -Continue with supportive care, lactulose  discontinued  - follow up with palliative  Hypernatremia.  Currently resolved. Patient is now full comfort care   Anemia in chronic kidney disease (CKD) Hemoglobin at 7.2. s/p 4 units pRBCs -No more labs as she is now full comfort care.   Acute on chronic kidney disease, stage 3b (HCC) Mild metabolic acidosis. Hypokalemia. Hypomagnesemia. Hypophosphatemia - no longer monitoring while comfort care status   Acute left intertrochanteric hip fracture Status post fall on 12/19 around 4 AM. head CT which was negative for acute findings. Left hip x-ray showed acute fracture -status post left hip cephalomedullary nail in place orthopedics on 12/19 Pain medicine as needed as ordered Aspirin  for DVT prophylaxis Weightbearing as tolerated on the left hip per orthopedic.   Obesity class I. Estimated body mass index is 30.18 kg/m as calculated from the following:   Height as of this encounter: 5' 9 (1.753 m).   Weight as of this encounter: 92.7 kg.  Complicating factor to overall care and prognosis    Subjective:  Patient reports no concerns. She is resting comfortably.   Physical Exam: Vitals:   08/18/23 0525 08/18/23 1631 08/19/23 0437 08/19/23 1649  BP: (!) 122/110 (!) 131/47 (!) 115/57 (!) 101/41  Pulse: 70 72 64 66  Resp: 18 15 16 16   Temp: (!) 97.5 F (36.4 C) 98.3 F (36.8 C) 98.1 F (36.7 C) 98.8 F (37.1 C)  TempSrc:  Oral Oral Oral  SpO2: 100% 94% 96% 92%  Weight:  Height:       General.  Chronically ill-appearing, in no acute distress. Pulmonary.  Lungs clear bilaterally, normal respiratory effort. CV.  Regular rate and rhythm, no JVD, rub or murmur. Abdomen.  Soft, nontender, nondistended, BS positive. CNS.  Alert and  oriented to self.  No focal neurologic deficit. Extremities.  No edema, no cyanosis, pulses intact and symmetrical.  Data Reviewed:  Lab results reviewed.  Family Communication: husband on telephone  Disposition: Status is: Inpatient Remains inpatient appropriate because: Severity of disease,  She need evaluation for inpatient hospice  Time spent:45 minutes  Author: Marien LITTIE Piety, MD 08/20/2023 7:10 AM  For on call review [email or web address].

## 2023-08-21 ENCOUNTER — Ambulatory Visit: Payer: Medicare Other

## 2023-08-21 ENCOUNTER — Inpatient Hospital Stay: Payer: Medicare Other

## 2023-08-21 DIAGNOSIS — N1832 Chronic kidney disease, stage 3b: Secondary | ICD-10-CM | POA: Diagnosis not present

## 2023-08-21 DIAGNOSIS — K7682 Hepatic encephalopathy: Secondary | ICD-10-CM | POA: Diagnosis not present

## 2023-08-21 DIAGNOSIS — D561 Beta thalassemia: Secondary | ICD-10-CM | POA: Diagnosis not present

## 2023-08-21 DIAGNOSIS — Z515 Encounter for palliative care: Secondary | ICD-10-CM | POA: Diagnosis not present

## 2023-08-21 MED ORDER — HYDROMORPHONE HCL 1 MG/ML IJ SOLN: 1.0000 mg | INTRAMUSCULAR | 0 refills | Status: AC | PRN

## 2023-08-21 NOTE — Progress Notes (Signed)
 Daily Progress Note   Patient Name: Rachel Huber       Date: 08/21/2023 DOB: [birth year]  Age: 68 y.o. 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    12/12: Patient has been excepting her lactulose  and having multiple bowel movements.  Mental status remains poor 12/13: No clinical status changes.  Patient is a little bit more awake.  Has been taking her p.o. lactulose . 12/15: Mental status seems close to baseline.  Waiting for PT and OT eval 12/16: PT and OT recommends SNF.  Husband refusing to take her back to [facility].  TOC working on alternative placement 12/17: Patient refusing lactulose  at times.  Ammonia trending up 12/18: Lactulose  enema was given as an option yesterday for oral lactulose  when she is not able to take.  Ammonia 94 12/19: Patient reportedly fell overnight/early morning and has new left intertrochanteric hip fracture.  Going for surgery/OR today 12/20 s/p left hip cephalmomedullary nail 12/22 GI has been consulted-recommendations for NGT placement to ensure administration of lactulose , liver US   assessing for ascites for potential paracentesis to assess for SBP--recommendations to assess for other sources of encephalopthy 12/30 - family GOC discussion with attending Dr. Caleen - shifted to full comfort measures   Palliative Medicine consulted for assisting with goals of care conversations.  Subjective: Extensive chart review has been completed prior to meeting patient including labs, vital signs, imaging, progress notes, orders, and available advanced directive documents from current and previous encounters.    Visited with Rachel Huber at her bedside. She is resting in bed with eyes closed, does not acknowledge my presence in room. Sister and niece at bedside during time of visit. Family shares earlier when they initially arrived, Rachel Huber having signs of pain with furrowed brow and restlessness, symptoms  much relieved with medication administration.   Reviewed MAR, no recommendations to adjust current regimen.  Later visited with husband-Rachel Huber, he confirms Rachel Huber has been approved to [facility] pending an open bed.  PMT will continue to follow for symptom management needs.  Thank you for allowing the Palliative Medicine Team to assist in the care of this patient.  Total time:  35 minutes  Time spent includes: Detailed review of medical records (labs, imaging, vital signs), medically appropriate exam (mental status, respiratory, cardiac, skin), discussed with treatment team, counseling and educating patient, family and staff, documenting clinical information, medication management and coordination of care.  Rachel Lesches, DNP, AGNP-C Palliative Medicine   Please contact Palliative Medicine Team phone at [phone] for questions and concerns.

## 2023-08-21 NOTE — TOC Progression Note (Signed)
 Transition of Care [facility]) - Progression Note    Patient Details  Name: Rachel Huber MRN: [id number] Date of Birth: May 07, 1956  Transition of Care [facility]) CM/SW Contact  Damoni Causby A Zerrick Hanssen, RN Phone Number: 08/21/2023, 12:48 PM  Clinical Narrative:    Chart reviewed.  Noted that patient's husband would like patient to go to [facility].  He has chosen to use [facility].  [facility] has been following patient.  Lonell with [facility] reports no Hospice Home beds at this time.    TOC will continue to follow.     Expected Discharge Plan: Skilled Nursing Facility Barriers to Discharge: Continued Medical Work up  Expected Discharge Plan and Services                                               Social Determinants of Health (SDOH) Interventions SDOH Screenings   Food Insecurity: Patient Unable To Answer (07/29/2023)  Housing: Patient Unable To Answer (07/29/2023)  Transportation Needs: Patient Unable To Answer (07/29/2023)  Utilities: Patient Unable To Answer (07/29/2023)  Alcohol  Screen: Low Risk  (04/04/2022)  Depression (PHQ2-9): Low Risk  (04/27/2023)  Financial Resource Strain: Low Risk  (06/03/2023)   Received from [facility]  Physical Activity: Inactive (04/04/2022)  Social Connections: Patient Unable To Answer (08/18/2023)  Stress: No Stress Concern Present (04/04/2022)  Tobacco Use: Medium Risk (07/28/2023)    Readmission Risk Interventions    04/30/2023   12:35 PM 12/19/2022   10:27 AM 11/04/2022   11:32 AM  Readmission Risk Prevention Plan  Transportation Screening Complete Complete Complete  Medication Review Oceanographer) Complete Complete Complete  PCP or Specialist appointment within 3-5 days of discharge Not Complete Complete Complete  HRI or Home Care Consult Complete Complete   SW Recovery Care/Counseling Consult Complete Complete Complete  Palliative Care Screening Not Applicable Not Applicable Not  Applicable  Skilled Nursing Facility Not Complete Not Applicable Not Applicable

## 2023-08-21 NOTE — Progress Notes (Addendum)
 [facility]- [employer]   No bed availability today at the [facility].  Hospital team notified.  Will check status of bed availability tomorrow.     Please don't hesitate to call with any Hospice related questions or concerns.    Thank you for the opportunity to participate in this patient's care. [facility] Liaison [phone]

## 2023-08-21 NOTE — Plan of Care (Signed)
  Problem: Education: Goal: Knowledge of General Education information will improve Description: Including pain rating scale, medication(s)/side effects and non-pharmacologic comfort measures Outcome: Progressing   Problem: Clinical Measurements: Goal: Ability to maintain clinical measurements within normal limits will improve Outcome: Progressing Goal: Will remain free from infection Outcome: Progressing Goal: Diagnostic test results will improve Outcome: Progressing Goal: Respiratory complications will improve Outcome: Progressing Goal: Cardiovascular complication will be avoided Outcome: Progressing   Problem: Activity: Goal: Risk for activity intolerance will decrease Outcome: Progressing   Problem: Safety: Goal: Ability to remain free from injury will improve Outcome: Progressing   Problem: Health Behavior/Discharge Planning: Goal: Ability to identify and utilize available resources and services will improve Outcome: Progressing Goal: Ability to manage health-related needs will improve Outcome: Progressing   Problem: Metabolic: Goal: Ability to maintain appropriate glucose levels will improve Outcome: Progressing

## 2023-08-21 NOTE — Progress Notes (Signed)
 Progress Note  Patient: Rachel Huber FMW:[id number] DOB: August 30, 1955 DOA: 07/28/2023     24 DOS: the patient was seen and examined on 08/21/2023   Brief hospital course: 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  Presented with hepatic encephalopathy- treated with lactulose  and supportive care. Required NG tube administration due to obtunded states.  Prolonged course of metabolic and hemodynamic instability without poor prognosis. Was transitioned to comfort measures 12/30. Showed spontaneous improvement 12/31 as she became alert to voice.  She remains stable on comfort care and family elects to pursue hospice house. She has been refusing her lactulose  so it was discontinued.   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 Suspect secondary to patient refusal to take lactulose  at nursing facility Patient has been treated with lactulose , ammonia level seem to be improving.  Peaked at 643.  However, patient has a worsening mental status , she became unresponsive. Due to altered mental status, patient has not been able to take lactulose   GI consult was obtained, lactulose  was given after NG tube was placed.  So far blood culture has been negative,  no need for additional prophylactic treatment with Rocephin . Due to altered mental status, not safe for oral intake, started on tube feed. Her MRI of the brain in September 2024 showed - 2.2 x 1.5 cm heavily calcified meningioma in the right middle cranial fossa with some mass effect on the lateral aspect of the right  temporal tip. MRI brain is unchanged from before Patient long-term prognosis is very poor, palliative care is following.  Hospice consulted. She is awaiting availability at hospice house. She was transitioned to full comfort care 12/30. -Continue with supportive care, lactulose  discontinued  - follow up with palliative  Hypernatremia.  Currently resolved. Patient is now full comfort care   Anemia in chronic kidney disease (CKD) Hemoglobin at 7.2. s/p 4 units pRBCs -No more labs as she is now full comfort care.   Acute on chronic kidney disease, stage 3b (HCC) Mild metabolic acidosis. Hypokalemia. Hypomagnesemia. Hypophosphatemia - no longer monitoring while comfort care status   Acute left intertrochanteric hip fracture Status post fall on 12/19 around 4 AM. head CT which was negative for acute findings. Left hip x-ray showed acute fracture -status post left hip cephalomedullary nail in place orthopedics on 12/19 Pain medicine as needed as ordered Aspirin  for DVT prophylaxis Weightbearing as tolerated on the left hip per orthopedic.   Obesity class I. Estimated body mass index is 30.18 kg/m as calculated from the following:   Height as of this encounter: 5' 9 (1.753 m).   Weight as of this encounter: 92.7 kg.  Complicating factor to overall care and prognosis    Subjective:  Patient reports no concerns. She is resting comfortably.   Physical Exam: Vitals:   08/18/23 0525 08/18/23 1631 08/19/23 0437 08/19/23 1649  BP: (!) 122/110 (!) 131/47 (!) 115/57 (!) 101/41  Pulse: 70 72 64 66  Resp: 18 15 16 16   Temp: (!) 97.5 F (36.4 C) 98.3 F (36.8 C) 98.1 F (36.7 C) 98.8 F (37.1 C)  TempSrc:  Oral Oral Oral  SpO2: 100% 94%  96% 92%  Weight:      Height:       General.  Chronically ill-appearing, in no acute distress. Pulmonary.  Lungs clear bilaterally, normal respiratory effort. CV.  Regular rate and rhythm, no JVD, rub or murmur. Abdomen.  Soft, nontender,  nondistended, BS positive. CNS.  Alert to voice and then falls back asleep. No focal neurologic deficit. Extremities.  No edema, no cyanosis, pulses intact and symmetrical.  Data Reviewed:  Lab results reviewed.  Family Communication: husband on telephone  Disposition: Status is: Inpatient Remains inpatient appropriate because: Severity of disease,  She need evaluation for inpatient hospice  Time spent:45 minutes  Author: Marien LITTIE Piety, MD 08/21/2023 7:08 AM  For on call review [email or web address].

## 2023-08-22 DIAGNOSIS — Z515 Encounter for palliative care: Secondary | ICD-10-CM | POA: Diagnosis not present

## 2023-08-22 DIAGNOSIS — D561 Beta thalassemia: Secondary | ICD-10-CM | POA: Diagnosis not present

## 2023-08-22 DIAGNOSIS — N1832 Chronic kidney disease, stage 3b: Secondary | ICD-10-CM | POA: Diagnosis not present

## 2023-08-22 DIAGNOSIS — K7682 Hepatic encephalopathy: Secondary | ICD-10-CM | POA: Diagnosis not present

## 2023-08-22 NOTE — Progress Notes (Signed)
 Daily Progress Note   Patient Name: Rachel Huber       Date: 08/22/2023 DOB: December 03, 1955  Age: 68 y.o. 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    12/12: Patient has been excepting her lactulose  and having multiple bowel movements.  Mental status remains poor 12/13: No clinical status changes.  Patient is a little bit more awake.  Has been taking her p.o. lactulose . 12/15: Mental status seems close to baseline.  Waiting for PT and OT eval 12/16: PT and OT recommends SNF.  Husband refusing to take her back to [facility].  TOC working on alternative placement 12/17: Patient refusing lactulose  at times.  Ammonia trending up 12/18: Lactulose  enema was given as an option yesterday for oral lactulose  when she is not able to take.  Ammonia 94 12/19: Patient reportedly fell overnight/early morning and has new left intertrochanteric hip fracture.  Going for surgery/OR today 12/20 s/p left hip cephalmomedullary nail 12/22 GI has been consulted-recommendations for NGT placement to ensure administration of lactulose , liver US   assessing for ascites for potential paracentesis to assess for SBP--recommendations to assess for other sources of encephalopthy 12/30 - family GOC discussion with attending Dr. Caleen - shifted to full comfort measures   Palliative Medicine consulted for assisting with goals of care conversations.  Subjective: Extensive chart review has been completed prior to meeting patient including labs, vital signs, imaging, progress notes, orders, and available advanced directive documents from current and previous encounters.    Visited with [name] at her bedside. She is more awake and alert today compared to yesterday. She is visiting with her sister and niece at bedside. She is requesting ice cream. She denies acute pain or discomfort at this time.  Hospice continues to follow for disposition. Review of  MAR, no recommendations for medication adjustments at this time. PMT will continue to follow for ongoing needs and support.  Care plan was discussed with nursing staff.  Thank you for allowing the Palliative Medicine Team to assist in the care of this patient.  Total time:  25 minutes  Time spent includes: Detailed review of medical records (labs, imaging, vital signs), medically appropriate exam (mental status, respiratory, cardiac, skin), discussed with treatment team, counseling and educating patient, family and staff, documenting clinical information, medication management and coordination of care.  Waddell Lesches, DNP, AGNP-C Palliative Medicine   Please contact Palliative Medicine Team phone at [phone] for questions and concerns.

## 2023-08-22 NOTE — Progress Notes (Addendum)
 [facility]- [employer]  Patient more alert today during visit. HL will continue to follow [facility] with [facility] regarding [facility] availability and assess patient for appropriateness.    Please don't hesitate to call with any Hospice related questions or concerns.    Thank you for the opportunity to participate in this patient's care. [facility] Liaison [phone]

## 2023-08-22 NOTE — Progress Notes (Signed)
 Progress Note  Patient: Rachel Huber FMW:[id number] DOB: May 24, 1956 DOA: 07/28/2023     25 DOS: the patient was seen and examined on 08/22/2023   Brief hospital course: Ms. 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  Presented with hepatic encephalopathy- treated with lactulose  and supportive care. Required NG tube administration due to obtunded states.  Prolonged course of metabolic and hemodynamic instability without poor prognosis. Was transitioned to comfort measures 12/30. Showed spontaneous improvement 12/31 as she became alert to voice.  She remains stable on comfort care and family elects to pursue hospice house. She has been refusing her lactulose  so it was discontinued.   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 Suspect secondary to patient refusal to take lactulose  at nursing facility Patient has been treated with lactulose , ammonia level seem to be improving.  Peaked at 643.  However, patient has a worsening mental status , she became unresponsive. Due to altered mental status, patient has not been able to take lactulose   GI consult was obtained, lactulose  was given after NG tube was placed.  So far blood culture has been negative,  no need for additional prophylactic treatment with Rocephin . Due to altered mental status, not safe for oral intake, started on tube feed. Her MRI of the brain in September 2024 showed - 2.2 x 1.5 cm heavily calcified meningioma in the right middle cranial fossa with some mass effect on the lateral aspect of the right  temporal tip. MRI brain is unchanged from before Patient long-term prognosis is very poor, palliative care is following.  Hospice consulted. She is awaiting availability at hospice house. She was transitioned to full comfort care 12/30. -Continue with supportive care, lactulose  discontinued  - follow up with palliative  Hypernatremia.  Currently resolved. Patient is now full comfort care   Anemia in chronic kidney disease (CKD) Hemoglobin at 7.2. s/p 4 units pRBCs -No more labs as she is now full comfort care.   Acute on chronic kidney disease, stage 3b (HCC) Mild metabolic acidosis. Hypokalemia. Hypomagnesemia. Hypophosphatemia - no longer monitoring while comfort care status   Acute left intertrochanteric hip fracture Status post fall on 12/19 around 4 AM. head CT which was negative for acute findings. Left hip x-ray showed acute fracture -status post left hip cephalomedullary nail in place orthopedics on 12/19 Pain medicine as needed as ordered Aspirin  for DVT prophylaxis Weightbearing as tolerated on the left hip per orthopedic.   Obesity class I. Estimated body mass index is 30.18 kg/m as calculated from the following:   Height as of this encounter: 5' 9 (1.753 m).   Weight as of this encounter: 92.7 kg.  Complicating factor to overall care and prognosis    Subjective:  Patient reports that she is thirsty. No other complaints.   Physical Exam: Vitals:   08/19/23 0437 08/19/23 1649 08/21/23 0812 08/21/23 2013  BP: (!) 115/57 (!) 101/41 (!) 107/41 (!) 116/45  Pulse: 64 66 79 74  Resp: 16 16 14    Temp: 98.1 F (36.7 C) 98.8 F (37.1 C) 98.3 F (36.8 C) 98.3 F (36.8 C)  TempSrc: Oral Oral Oral   SpO2: 96% 92%  94% 100%  Weight:      Height:       General.  Chronically ill-appearing, in no acute distress. Pulmonary.  Lungs clear bilaterally, normal respiratory effort. CV.  Regular rate and rhythm, no JVD, rub or murmur. Abdomen.  Soft, nontender,  nondistended, BS positive. CNS.  Alert to voice and then falls back asleep. No focal neurologic deficit. Extremities.  No edema, no cyanosis, pulses intact and symmetrical.  Data Reviewed:  Lab results reviewed.  Family Communication: husband on telephone  Disposition: Status is: Inpatient Remains inpatient appropriate because: Severity of disease,  She need evaluation for inpatient hospice  Time spent:45 minutes  Author: Marien LITTIE Piety, MD 08/22/2023 7:21 AM  For on call review [email or web address].

## 2023-08-22 NOTE — Plan of Care (Signed)
  Problem: Education: Goal: Knowledge of General Education information will improve Description: Including pain rating scale, medication(s)/side effects and non-pharmacologic comfort measures Outcome: Not Applicable   Problem: Health Behavior/Discharge Planning: Goal: Ability to manage health-related needs will improve Outcome: Not Applicable   Problem: Clinical Measurements: Goal: Ability to maintain clinical measurements within normal limits will improve Outcome: Not Applicable Goal: Will remain free from infection Outcome: Not Applicable Goal: Diagnostic test results will improve Outcome: Not Applicable Goal: Respiratory complications will improve Outcome: Not Applicable Goal: Cardiovascular complication will be avoided Outcome: Not Applicable   Problem: Activity: Goal: Risk for activity intolerance will decrease Outcome: Not Applicable   Problem: Nutrition: Goal: Adequate nutrition will be maintained Outcome: Not Applicable   Problem: Coping: Goal: Level of anxiety will decrease Outcome: Not Applicable   Problem: Elimination: Goal: Will not experience complications related to bowel motility Outcome: Not Applicable Goal: Will not experience complications related to urinary retention Outcome: Not Applicable   Problem: Pain Management: Goal: General experience of comfort will improve Outcome: Not Applicable   Problem: Safety: Goal: Ability to remain free from injury will improve Outcome: Not Applicable   Problem: Skin Integrity: Goal: Risk for impaired skin integrity will decrease Outcome: Not Applicable   Problem: Education: Goal: Ability to describe self-care measures that may prevent or decrease complications (Diabetes Survival Skills Education) will improve Outcome: Not Applicable Goal: Individualized Educational Video(s) Outcome: Not Applicable   Problem: Coping: Goal: Ability to adjust to condition or change in health will improve Outcome: Not  Applicable   Problem: Fluid Volume: Goal: Ability to maintain a balanced intake and output will improve Outcome: Not Applicable   Problem: Health Behavior/Discharge Planning: Goal: Ability to identify and utilize available resources and services will improve Outcome: Not Applicable Goal: Ability to manage health-related needs will improve Outcome: Not Applicable   Problem: Metabolic: Goal: Ability to maintain appropriate glucose levels will improve Outcome: Not Applicable   Problem: Nutritional: Goal: Maintenance of adequate nutrition will improve Outcome: Not Applicable Goal: Progress toward achieving an optimal weight will improve Outcome: Not Applicable   Problem: Skin Integrity: Goal: Risk for impaired skin integrity will decrease Outcome: Not Applicable   Problem: Tissue Perfusion: Goal: Adequacy of tissue perfusion will improve Outcome: Not Applicable   Problem: Education: Goal: Knowledge of the prescribed therapeutic regimen will improve Outcome: Not Applicable   Problem: Coping: Goal: Ability to identify and develop effective coping behavior will improve Outcome: Not Applicable   Problem: Clinical Measurements: Goal: Quality of life will improve Outcome: Not Applicable   Problem: Respiratory: Goal: Verbalizations of increased ease of respirations will increase Outcome: Not Applicable   Problem: Role Relationship: Goal: Family's ability to cope with current situation will improve Outcome: Not Applicable Goal: Ability to verbalize concerns, feelings, and thoughts to partner or family member will improve Outcome: Not Applicable   Problem: Pain Management: Goal: Satisfaction with pain management regimen will improve Outcome: Not Applicable

## 2023-08-23 DIAGNOSIS — D561 Beta thalassemia: Secondary | ICD-10-CM | POA: Diagnosis not present

## 2023-08-23 DIAGNOSIS — N1832 Chronic kidney disease, stage 3b: Secondary | ICD-10-CM | POA: Diagnosis not present

## 2023-08-23 DIAGNOSIS — Z515 Encounter for palliative care: Secondary | ICD-10-CM | POA: Diagnosis not present

## 2023-08-23 DIAGNOSIS — D631 Anemia in chronic kidney disease: Secondary | ICD-10-CM | POA: Diagnosis not present

## 2023-08-23 DIAGNOSIS — K7682 Hepatic encephalopathy: Secondary | ICD-10-CM | POA: Diagnosis not present

## 2023-08-23 NOTE — TOC Progression Note (Signed)
 Transition of Care [facility]) - Progression Note    Patient Details  Name: Rachel Huber MRN: [id number] Date of Birth: [birth year]  [facility]) CM/SW Contact  Clotilda Brenner, [place] Phone Number: 08/23/2023, 1:58 PM  Clinical Narrative:   CSW spoke with patients husband [name] at bedside. Patient does not have medicaid for LTC. Patients husband states he doesn't believe patient will qualify for medicaid. Patients husband states he will need to check patients finances to see if they would be able to pay privately at a facility. CSW did inform the family that most facilities are between 8,000-10,000 monthly out of pocket. Patients husband would also like to speak with financial counseling tomorrow to see if there is any chance patient will qualify for medicaid. Patients family states they do not have someone to care for patient at home. CSW also informed the family they could pay privately for someone to care for patient at home.     Expected Discharge Plan: Skilled Nursing Facility Barriers to Discharge: Continued Medical Work up  Expected Discharge Plan and Services                                               Social Determinants of Health (SDOH) Interventions SDOH Screenings   Food Insecurity: Patient Unable To Answer (07/29/2023)  Housing: Patient Unable To Answer (07/29/2023)  Transportation Needs: Patient Unable To Answer (07/29/2023)  Utilities: Patient Unable To Answer (07/29/2023)  Alcohol  Screen: Low Risk  (04/04/2022)  Depression (PHQ2-9): Low Risk  (04/27/2023)  Financial Resource Strain: Low Risk  (06/03/2023)   Received from [facility]  Physical Activity: Inactive (04/04/2022)  Social Connections: Patient Unable To Answer (08/18/2023)  Stress: No Stress Concern Present (04/04/2022)  Tobacco Use: Medium Risk (07/28/2023)    Readmission Risk Interventions    04/30/2023   12:35 PM 12/19/2022   10:27 AM 11/04/2022   11:32 AM   Readmission Risk Prevention Plan  Transportation Screening Complete Complete Complete  Medication Review Oceanographer) Complete Complete Complete  PCP or Specialist appointment within 3-5 days of discharge Not Complete Complete Complete  HRI or Home Care Consult Complete Complete   SW Recovery Care/Counseling Consult Complete Complete Complete  Palliative Care Screening Not Applicable Not Applicable Not Applicable  Skilled Nursing Facility Not Complete Not Applicable Not Applicable

## 2023-08-23 NOTE — Progress Notes (Signed)
 Progress Note Patient: Rachel Huber FMW:[id number] DOB: Dec 15, 1955 DOA: 07/28/2023     26 DOS: the patient was seen and examined on 08/23/2023   Brief hospital course: 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  Presented with hepatic encephalopathy- treated with lactulose  and supportive care. Required NG tube administration due to obtunded states.  Prolonged course of metabolic and hemodynamic instability without poor prognosis. Was transitioned to comfort measures 12/30. Showed spontaneous improvement 12/31 as she became alert to voice.  She remains stable on comfort care and family elects to pursue hospice house. She has been refusing her lactulose  so it was discontinued.   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 Suspect secondary to patient refusal to take lactulose  at nursing facility Patient has been treated with lactulose , ammonia level seem to be improving.  Peaked at 643.  However, patient has a worsening mental status , she became unresponsive. Due to altered mental status, patient has not been able to take lactulose   GI consult was obtained, lactulose  was given after NG tube was placed.  So far blood culture has been negative,  no need for additional prophylactic treatment with Rocephin . Due to altered mental status, not safe for oral intake, started on tube feed. Her MRI of the brain in September 2024 showed - 2.2 x 1.5 cm heavily calcified meningioma in the right middle cranial fossa with some mass effect on the lateral aspect of the right  temporal tip. MRI brain is unchanged from before Patient long-term prognosis is very poor, palliative care is following.  Hospice consulted. She is awaiting availability at hospice house. Hospice house has now declined her admission. Toc is involved in pursuing alternate location to pursue hospice She was transitioned to full comfort care 12/30. -Continue with supportive care, lactulose  discontinued  - follow up with palliative  Hypernatremia.  Currently resolved. Patient is now full comfort care   Anemia in chronic kidney disease (CKD) Hemoglobin at 7.2. s/p 4 units pRBCs -No more labs as she is now full comfort care.   Acute on chronic kidney disease, stage 3b (HCC) Mild metabolic acidosis. Hypokalemia. Hypomagnesemia. Hypophosphatemia - no longer monitoring while comfort care status   Acute left intertrochanteric hip fracture Status post fall on 12/19 around 4 AM. head CT which was negative for acute findings. Left hip x-ray showed acute fracture -status post left hip cephalomedullary nail in place orthopedics on 12/19 Pain medicine as needed as ordered Aspirin  for DVT prophylaxis Weightbearing as tolerated on the left hip per orthopedic.   Obesity class I. Estimated body mass index is 30.18 kg/m as calculated from the following:   Height as of this encounter: 5' 9 (1.753 m).   Weight as of this encounter: 92.7 kg.  Complicating factor to overall care and prognosis    Subjective:  Patient reports no complaints. She says, I'm fine. Not responding to further conversation.   Physical Exam: Vitals:   08/21/23 2013 08/22/23 0755 08/22/23 2038 08/23/23 0523  BP: (!) 116/45 115/68 (!) 105/54 (!) 103/42  Pulse: 74 73 72 74  Resp:   16 18  Temp: 98.3 F (36.8 C) (!) 96.6 F (  35.9 C) 98.4 F (36.9 C) 98.2 F (36.8 C)  TempSrc:      SpO2: 100% 98% 100% 98%  Weight:      Height:       General.  Chronically ill-appearing, in no acute distress. Pulmonary.  Lungs  clear bilaterally, normal respiratory effort. CV.  Regular rate and rhythm, no JVD, rub or murmur. Abdomen.  Soft, nontender, nondistended, BS positive. CNS.  Alert to voice and then falls back asleep. No focal neurologic deficit. Extremities.  No edema, no cyanosis, pulses intact and symmetrical.  Data Reviewed:  Lab results reviewed.  Family Communication: husband on telephone  Disposition: Status is: Inpatient Remains inpatient appropriate because: Severity of disease,  She need evaluation for inpatient hospice  Time spent:45 minutes  Author: Marien LITTIE Piety, MD 08/23/2023 7:13 AM  For on call review [email or web address].

## 2023-08-23 NOTE — Progress Notes (Signed)
 [facility]- [employer]   Patient is no longer appropriate for the Hospice Home at this time.  Patient does not have any unmanaged symptoms and is not actively transitioning to end of life. If changes in patient's condition occur, we will be happy to re-evaluate. Spouse has been informed.  Discussed with him setting up Hospice services at home, but he does not feel that he can manage her needs, even with help from family. He is interested in talking with TOC about LTC placement with Hospice following. TOC and Attending physician notified.     Please don't hesitate to call with any Hospice related questions or concerns.    Thank you for the opportunity to participate in this patient's care. [facility] Liaison [phone]

## 2023-08-23 NOTE — Progress Notes (Signed)
 Daily Progress Note   Patient Name: Rachel Huber       Date: 08/23/2023 DOB: 10/18/1955  Age: 68 y.o. 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    12/12: Patient has been excepting her lactulose  and having multiple bowel movements.  Mental status remains poor 12/13: No clinical status changes.  Patient is a little bit more awake.  Has been taking her p.o. lactulose . 12/15: Mental status seems close to baseline.  Waiting for PT and OT eval 12/16: PT and OT recommends SNF.  Husband refusing to take her back to [facility].  TOC working on alternative placement 12/17: Patient refusing lactulose  at times.  Ammonia trending up 12/18: Lactulose  enema was given as an option yesterday for oral lactulose  when she is not able to take.  Ammonia 94 12/19: Patient reportedly fell overnight/early morning and has new left intertrochanteric hip fracture.  Going for surgery/OR today 12/20 s/p left hip cephalmomedullary nail 12/22 GI has been consulted-recommendations for NGT placement to ensure administration of lactulose , liver US   assessing for ascites for potential paracentesis to assess for SBP--recommendations to assess for other sources of encephalopthy 12/30 - family GOC discussion with attending Dr. Caleen - shifted to full comfort measures   Palliative Medicine consulted for assisting with goals of care conversations.  Subjective: Extensive chart review has been completed prior to meeting patient including labs, vital signs, imaging, progress notes, orders, and available advanced directive documents from current and previous encounters.    Visited with Rachel Huber at her bedside. She is resting in bed with her eyes closed, she does not acknowledge my presence in room and does not respond to her name being called. No family at bedside. She does not appear to be in any acute pain or distress.  No longer appropriate for IPU. TOC and HL working with husband to determine home with hospice versus LTC with hospice following.  PMT remains available for ongoing needs and support.  Thank you for allowing the Palliative Medicine Team to assist in the care of this patient.  Total time:  25 minutes  Time spent includes: Detailed review of medical records (labs, imaging, vital signs), medically appropriate exam (mental status, respiratory, cardiac, skin), discussed with treatment team, counseling and educating patient, family and staff, documenting clinical information, medication management and coordination of care.  Waddell Lesches, DNP, AGNP-C Palliative Medicine   Please contact Palliative Medicine Team phone at [phone] for questions and concerns.

## 2023-08-23 NOTE — Plan of Care (Signed)
  Problem: Education: Goal: Knowledge of the prescribed therapeutic regimen will improve Outcome: Progressing   Problem: Coping: Goal: Ability to identify and develop effective coping behavior will improve Outcome: Progressing   Problem: Clinical Measurements: Goal: Quality of life will improve Outcome: Progressing   Problem: Respiratory: Goal: Verbalizations of increased ease of respirations will increase Outcome: Progressing   Problem: Role Relationship: Goal: Family's ability to cope with current situation will improve Outcome: Progressing Goal: Ability to verbalize concerns, feelings, and thoughts to partner or family member will improve Outcome: Progressing

## 2023-08-24 DIAGNOSIS — Z515 Encounter for palliative care: Secondary | ICD-10-CM | POA: Diagnosis not present

## 2023-08-24 DIAGNOSIS — K7682 Hepatic encephalopathy: Secondary | ICD-10-CM | POA: Diagnosis not present

## 2023-08-24 DIAGNOSIS — N1832 Chronic kidney disease, stage 3b: Secondary | ICD-10-CM | POA: Diagnosis not present

## 2023-08-24 DIAGNOSIS — S72142S Displaced intertrochanteric fracture of left femur, sequela: Secondary | ICD-10-CM | POA: Diagnosis not present

## 2023-08-24 DIAGNOSIS — D561 Beta thalassemia: Secondary | ICD-10-CM | POA: Diagnosis not present

## 2023-08-24 NOTE — Plan of Care (Signed)
  Problem: Respiratory: Goal: Verbalizations of increased ease of respirations will increase Outcome: Progressing   Problem: Pain Management: Goal: Satisfaction with pain management regimen will improve Outcome: Progressing   

## 2023-08-24 NOTE — Plan of Care (Signed)
  Problem: Clinical Measurements: Goal: Quality of life will improve Outcome: Progressing   Problem: Respiratory: Goal: Verbalizations of increased ease of respirations will increase Outcome: Progressing   Problem: Role Relationship: Goal: Family's ability to cope with current situation will improve Outcome: Progressing Goal: Ability to verbalize concerns, feelings, and thoughts to partner or family member will improve Outcome: Progressing   Problem: Pain Management: Goal: Satisfaction with pain management regimen will improve Outcome: Progressing

## 2023-08-24 NOTE — Progress Notes (Signed)
 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  Presented with hepatic encephalopathy- treated with lactulose  and supportive care. Required NG tube administration due to obtunded states.  Prolonged course of metabolic and hemodynamic instability without poor prognosis. Was transitioned to comfort measures 12/30. Showed spontaneous improvement 12/31 as she became alert to voice.  She remains stable on comfort care and family elects to pursue hospice house. She has been refusing her lactulose  so it was discontinued.   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 Suspect secondary to patient refusal to take lactulose  at nursing facility Patient has been treated with lactulose , ammonia level seem to be improving.  Peaked at 643.  However, patient has a worsening mental status , she became unresponsive. Due to altered mental status, patient has not been able to take lactulose   GI consult was obtained, lactulose  was given after NG tube was placed.  So far blood culture has been negative,  no need for additional prophylactic treatment with Rocephin . Due to altered mental status, not safe for oral intake, started on tube feed. Her MRI of the brain in September 2024 showed - 2.2 x 1.5 cm heavily calcified meningioma in the right middle cranial fossa with some mass effect on the lateral aspect of the right  temporal tip. MRI brain is unchanged from before Patient long-term prognosis is very poor, palliative care is following.  Hospice consulted.  She was transitioned to full comfort care 12/30. She was awaiting availability at hospice house. Hospice house has now declined her admission. Toc is involved in pursuing alternate location to pursue hospice -Continue with supportive care  Hypernatremia.  Not acutely monitoring Patient is now full comfort care   Anemia in chronic kidney disease (CKD) Hemoglobin at 7.2. s/p 4 units pRBCs -No more labs as she is now full comfort care.   Acute on chronic kidney disease, stage 3b (HCC) Mild metabolic acidosis. Hypokalemia. Hypomagnesemia. Hypophosphatemia - no longer monitoring while comfort care status   Acute left intertrochanteric hip fracture Status post fall on 12/19 around 4 AM. head CT which was negative for acute findings. Left hip x-ray showed acute fracture -status post left hip cephalomedullary nail in place orthopedics on 12/19 Pain medicine as needed as ordered Aspirin  for DVT prophylaxis Weightbearing as tolerated on the left hip per orthopedic.   Obesity class I. Estimated body mass index is 30.18 kg/m as calculated from the following:   Height as of this encounter: 5' 9 (1.753 m).   Weight as of this encounter: 92.7 kg.  Complicating factor to overall care and prognosis    Subjective:  Patient reports I'm ok  Physical Exam: Vitals:   08/22/23 2038 08/23/23 0523 08/23/23 0839 08/23/23 2201  BP: (!) 105/54 (!) 103/42 (!) 104/51 (!) 114/44  Pulse: 72 74 71 69  Resp: 16 18 16    Temp: 98.4 F (36.9 C) 98.2 F (36.8 C) 97.8 F (36.6 C) 98.4 F (36.9 C)  TempSrc:   Oral  SpO2: 100% 98% 95% 99%  Weight:      Height:       General.  Chronically ill-appearing, in no acute distress. Pulmonary.  Lungs clear bilaterally, normal respiratory effort. CV.  Regular rate and rhythm, no JVD, rub or murmur. Abdomen.  Soft,  nontender, nondistended, BS positive. CNS.  Alert to voice and then falls back asleep. No focal neurologic deficit. Extremities.  No edema, no cyanosis, pulses intact and symmetrical.  Data Reviewed:  Lab results reviewed.  Family Communication: husband on telephone  Disposition: Status is: Inpatient Remains inpatient appropriate because: Severity of disease,  She need evaluation for inpatient hospice  Time spent:45 minutes  Author: Marien LITTIE Piety, MD 08/24/2023 7:29 AM  For on call review [email or web address].

## 2023-08-24 NOTE — Progress Notes (Signed)
 [facility] Liaison Note  Follow up on referral for routine IPU bed.  Patient has improved and is not longer appropriate for IPU.  Patient responds to my voice when I enter the room.  She is disoriented but able to carry on a conversation.  Patient has no symptom management needs at this time.  She is taking in some po liquids, only 80ml charged today.  Plan from yesterday continues with searching for LTC or patient going home with caregivers.  Please do not hesitate to call with any hospice related questions or concerns.  Hospital liaison team will continue to follow through final disposition with ongoing collaboration with [facility] and patient's hospital medical team.  Saddie HILARIO Na, RN Nurse Liaison [phone]

## 2023-08-24 NOTE — Progress Notes (Signed)
 Palliative Care Progress Note, Assessment & Plan   Patient Name: Rachel Huber       Date: 08/24/2023 DOB: May 02, 1956  Age: 68 y.o. MRN#: [id number] Attending Physician: Lenon Marien CROME, MD Primary Care Physician: Lenon Layman ORN, MD Admit Date: 07/28/2023  Subjective: Patient is lying in bed in no apparent distress.  She is awake and alert.  She acknowledges my presence and is able to make her wishes known.  Her sister is at bedside during my visit.  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    12/12: Patient has been excepting her lactulose  and having multiple bowel movements.  Mental status remains poor 12/13: No clinical status changes.  Patient is a little bit more awake.  Has been taking her p.o. lactulose . 12/15: Mental status seems close to baseline.  Waiting for PT and OT eval 12/16: PT and OT recommends SNF.  Husband refusing to take her back to [facility].  TOC working on alternative placement 12/17: Patient refusing lactulose  at times.  Ammonia trending up 12/18: Lactulose  enema was given as an option yesterday for oral lactulose  when she is not able to take.  Ammonia 94 12/19: Patient reportedly fell overnight/early morning and has new left intertrochanteric hip fracture.  Going for surgery/OR today 12/20 s/p left hip  cephalmomedullary nail 12/22 GI has been consulted-recommendations for NGT placement to ensure administration of lactulose , liver US  assessing for ascites for potential paracentesis to assess for SBP--recommendations to assess for other sources of encephalopthy 12/30 - family GOC discussion with attending Dr. Caleen - shifted to full comfort measures   Palliative Medicine consulted for assisting with goals of care conversations.  Summary of counseling/coordination of care: Extensive chart review completed prior to meeting patient including labs, vital signs, imaging, progress notes, orders, and available advanced directive documents from current and previous encounters.   After reviewing the patient's chart and assessing the patient at bedside, I spoke with patient and sister in regards to symptom management and plan of care.  Symptoms assessed.  Patient endorses mild itching of her low back.  She declines for us  to roll her over and itch her at this moment.  No sign of rash or skin irritation noted home skin that patient was allowing me to evaluate.  We discussed that comfort measures continue.  Patient says that sounds great.  She denies pain or other acute complaints at this time.  No adjustment to [facility] needed.  TOC following closely for discharge planning.  PMT will continue to follow and support patient and family throughout her hospitalization.  Full comfort measures continue.  Physical Exam Vitals reviewed.  Constitutional:      General: She is not in acute distress.    Appearance: She is normal weight.  HENT:     Head: Normocephalic.  Mouth/Throat:     Mouth: Mucous membranes are moist.  Eyes:     Pupils: Pupils are equal, round, and reactive to light.  Pulmonary:     Effort: Pulmonary effort is normal.  Abdominal:     Palpations: Abdomen is soft.  Musculoskeletal:     Comments: Generalized weakness  Neurological:     Mental Status: She is alert.     Comments:  Oriented to place and self  Psychiatric:        Behavior: Behavior normal.             Total Time 25 minutes   Time spent includes: Detailed review of medical records (labs, imaging, vital signs), medically appropriate exam (mental status, respiratory, cardiac, skin), discussed with treatment team, counseling and educating patient, family and staff, documenting clinical information, medication management and coordination of care.  Lamarr L. Arvid, DNP, FNP-BC Palliative Medicine Team

## 2023-08-25 DIAGNOSIS — N1832 Chronic kidney disease, stage 3b: Secondary | ICD-10-CM | POA: Diagnosis not present

## 2023-08-25 DIAGNOSIS — S72142S Displaced intertrochanteric fracture of left femur, sequela: Secondary | ICD-10-CM | POA: Diagnosis not present

## 2023-08-25 DIAGNOSIS — D561 Beta thalassemia: Secondary | ICD-10-CM | POA: Diagnosis not present

## 2023-08-25 DIAGNOSIS — Z515 Encounter for palliative care: Secondary | ICD-10-CM | POA: Diagnosis not present

## 2023-08-25 DIAGNOSIS — K7682 Hepatic encephalopathy: Secondary | ICD-10-CM | POA: Diagnosis not present

## 2023-08-25 MED ORDER — ACETAMINOPHEN 650 MG RE SUPP: 325.0000 mg | Freq: Four times a day (QID) | RECTAL | Status: DC | PRN

## 2023-08-25 MED ORDER — ACETAMINOPHEN 325 MG PO TABS: 650.0000 mg | ORAL_TABLET | Freq: Four times a day (QID) | ORAL | Status: DC | PRN

## 2023-08-25 NOTE — Progress Notes (Signed)
 Progress Note Patient: Rachel Huber FMW:[id number] DOB: 06/14/1956 DOA: 07/28/2023     28 DOS: the patient was seen and examined on 08/25/2023   Brief hospital course: 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  Presented with hepatic encephalopathy- treated with lactulose  and supportive care. Required NG tube administration due to obtunded states.  Prolonged course of metabolic and hemodynamic instability without poor prognosis. Was transitioned to comfort measures 12/30. Showed spontaneous improvement 12/31 as she became alert to voice.  She remains stable on comfort care and family elects to pursue hospice house. She has been refusing her lactulose  so it was discontinued.   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 Suspect secondary to patient refusal to take lactulose  at nursing facility Patient has been treated with lactulose , ammonia level seem to be improving.  Peaked at 643.  However, patient has a worsening mental status , she became unresponsive. Due to altered mental status, patient has not been able to take lactulose   GI consult was obtained, lactulose  was given after NG tube was placed.  So far blood culture has been negative,  no need for additional prophylactic treatment with Rocephin . Due to altered mental status, not safe for oral intake, started on tube feed. Her MRI of the brain in September 2024 showed - 2.2 x 1.5 cm heavily calcified meningioma in the right middle cranial fossa with some mass effect on the lateral aspect of the right  temporal tip. MRI brain is unchanged from before Patient long-term prognosis is very poor, palliative care is following.  Hospice consulted.  She was transitioned to full comfort care 12/30. She was awaiting availability at hospice house. Hospice house has now declined her admission. Toc is involved in pursuing alternate location to pursue hospice -Continue with supportive care  Hypernatremia.  Not acutely monitoring Patient is now full comfort care   Anemia in chronic kidney disease (CKD) Hemoglobin at 7.2. s/p 4 units pRBCs -No more labs as she is now full comfort care.   Acute on chronic kidney disease, stage 3b (HCC) Mild metabolic acidosis. Hypokalemia. Hypomagnesemia. Hypophosphatemia - no longer monitoring while comfort care status   Acute left intertrochanteric hip fracture Status post fall on 12/19 around 4 AM. head CT which was negative for acute findings. Left hip x-ray showed acute fracture -status post left hip cephalomedullary nail in place orthopedics on 12/19 Pain medicine as needed as ordered Aspirin  for DVT prophylaxis Weightbearing as tolerated on the left hip per orthopedic.   Obesity class I. Estimated body mass index is 30.18 kg/m as calculated from the following:   Height as of this encounter: 5' 9 (1.753 m).   Weight as of this encounter: 92.7 kg.  Complicating factor to overall care and prognosis    Subjective:  Patient did not wake to voice this morning. Appears comfortably sleeping.   Physical Exam: Vitals:   08/23/23 0523 08/23/23 0839 08/23/23 2201 08/25/23 0423  BP: (!) 103/42 (!) 104/51 (!) 114/44 (!) 104/41  Pulse: 74 71 69 74  Resp: 18 16  16   Temp: 98.2 F (36.8 C) 97.8 F (36.6 C) 98.4 F (36.9 C) 97.7 F (  36.5 C)  TempSrc:  Oral  Oral  SpO2: 98% 95% 99% 95%  Weight:      Height:       General.  Chronically ill-appearing, in no acute distress. Pulmonary. normal respiratory effort. CV.  Well-perfused  Abdomen.  Soft,  nontender, nondistended, BS positive. CNS.  asleep Extremities.  No edema, no cyanosis, pulses intact and symmetrical.  Data Reviewed:  Lab results reviewed.  Family Communication: husband on telephone  Disposition: Status is: Inpatient Remains inpatient appropriate because: Severity of disease,  She need evaluation for inpatient hospice  Time spent:45 minutes  Author: Marien LITTIE Piety, MD 08/25/2023 7:17 AM  For on call review [email or web address].

## 2023-08-25 NOTE — Plan of Care (Signed)
  Problem: Clinical Measurements: Goal: Quality of life will improve Outcome: Progressing   Problem: Respiratory: Goal: Verbalizations of increased ease of respirations will increase Outcome: Progressing   Problem: Role Relationship: Goal: Family's ability to cope with current situation will improve Outcome: Progressing Goal: Ability to verbalize concerns, feelings, and thoughts to partner or family member will improve Outcome: Progressing   Problem: Pain Management: Goal: Satisfaction with pain management regimen will improve Outcome: Progressing

## 2023-08-25 NOTE — Progress Notes (Signed)
 [facility] Liaison Note  Follow up on hospice referral at LTC vs. Home with hospice.   Patient was evaluated earlier for InPatient Hospice Unit [facility]) who had a change in status and no longer met criteria for IPU.  No family at bedside.  Patient resting in bed with no lights.  Patient arouses to tactile stimuli.  She denies any pain or needs.  Said she did eat good today.  Patient very drowsy and does not carry on much of a conversation with me.  Spoke with patient's RN who states patient was very drowsy this morning but then woke up and was talkative, eating and in good spirits.    No update today from Nichole Jobs, TOC on bed search status or discharge plan.  Hospital team will continue to follow through final disposition.  Please do not hesitate to call with any hospice related questions or concerns.  Rachel HILARIO Na, RN Nurse Liaison [phone]

## 2023-08-25 NOTE — Progress Notes (Addendum)
 Nutrition Brief Note  Chart reviewed. Pt remains comfort care. Per hospice notes, pt does not meet criteria for inpatient hospice and plan to discharge home with hospice services. Case discussed with RN; pt is appropriate for PO intake- MD ordered diet for pleasure.  No further nutrition interventions planned at this time.  Please re-consult as needed.   Margery ORN, RD, LDN, CDCES Registered Dietitian III Certified Diabetes Care and Education Specialist If unable to reach this RD, please use RD Inpatient group chat on secure chat between hours of 8am-4 pm daily

## 2023-08-25 NOTE — Progress Notes (Signed)
 Palliative Care Progress Note, Assessment & Plan   Patient Name: Rachel Huber       Date: 08/25/2023 DOB: 07-01-1956  Age: 68 y.o. MRN#: [id number] Attending Physician: Lenon Marien CROME, MD Primary Care Physician: Lenon Layman ORN, MD Admit Date: 07/28/2023  Subjective: Patient is lying in bed, asleep.  She easily awakens to my presence but quickly returns to sleep. No family or friends present during my visit.   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    12/12: Patient has been excepting her lactulose  and having multiple bowel movements.  Mental status remains poor 12/13: No clinical status changes.  Patient is a little bit more awake.  Has been taking her p.o. lactulose . 12/15: Mental status seems close to baseline.  Waiting for PT and OT eval 12/16: PT and OT recommends SNF.  Husband refusing to take her back to [facility].  TOC working on alternative placement 12/17: Patient refusing lactulose  at times.  Ammonia trending up 12/18: Lactulose  enema was given as an option yesterday for oral lactulose  when she is not able to take.  Ammonia 94 12/19: Patient reportedly fell overnight/early morning and has new left intertrochanteric hip fracture.  Going for surgery/OR today 12/20 s/p left hip cephalmomedullary nail 12/22 GI has been  consulted-recommendations for NGT placement to ensure administration of lactulose , liver US  assessing for ascites for potential paracentesis to assess for SBP--recommendations to assess for other sources of encephalopthy 12/30 - family GOC discussion with attending Dr. Caleen - shifted to full comfort measures   Palliative Medicine consulted for assisting with goals of care conversations.  Summary of counseling/coordination of care: Extensive chart review completed prior to meeting patient including labs, vital signs, imaging, progress notes, orders, and available advanced directive documents from current and previous encounters.   After reviewing the patient's chart and assessing the patient at bedside, I assessed patient's for symptom management.  She has no signs of pain or discomfort at this time.  Respirations are even and unlabored.  No adjustment to [facility] needed at this time.  TOC continues to follow for discharge planning.  Plan remains for patient to discharge with hospice services.  Full comfort measures continue.  Physical Exam Vitals reviewed.  Constitutional:      General: She is not in acute distress.    Appearance: She is normal weight.  HENT:     Head: Normocephalic.     Mouth/Throat:     Mouth: Mucous membranes are moist.  Pulmonary:     Effort: Pulmonary effort is normal.  Abdominal:     Palpations: Abdomen is soft.  Skin:    General: Skin is warm and dry.  Psychiatric:        Behavior: Behavior normal.             Total Time 25 minutes  Time spent includes: Detailed review of medical records (labs, imaging, vital signs), medically appropriate exam (mental status, respiratory, cardiac, skin), discussed with treatment team, counseling and educating patient, family and staff, documenting clinical information, medication management and coordination of care.  Lamarr L. Arvid, DNP, FNP-BC Palliative Medicine Team

## 2023-08-26 DIAGNOSIS — K7682 Hepatic encephalopathy: Secondary | ICD-10-CM | POA: Diagnosis not present

## 2023-08-26 DIAGNOSIS — Z515 Encounter for palliative care: Secondary | ICD-10-CM | POA: Diagnosis not present

## 2023-08-26 DIAGNOSIS — S72142S Displaced intertrochanteric fracture of left femur, sequela: Secondary | ICD-10-CM | POA: Diagnosis not present

## 2023-08-26 DIAGNOSIS — N1832 Chronic kidney disease, stage 3b: Secondary | ICD-10-CM | POA: Diagnosis not present

## 2023-08-26 NOTE — Plan of Care (Signed)
  Problem: Respiratory: Goal: Verbalizations of increased ease of respirations will increase Outcome: Progressing   Problem: Pain Management: Goal: Satisfaction with pain management regimen will improve Outcome: Progressing   

## 2023-08-26 NOTE — Progress Notes (Signed)
 [facility] Liaison Note   Follow up on hospice referral at LTC vs. Home with hospice.   Patient was evaluated earlier for InPatient Hospice Unit [facility] who had a change in status and no longer met criteria for IPU.  Met with the patient at bedside.  Patient awake, alert and very interactive this morning.  She carries on a conversation with me.  She denies any pain or any needs.  During my visit, her sister and brother in law came to visit.  She was joking and teasing with her BIL.  Patient's husband has not been at bedside this week when I have visited patient.    Spoke with Darrian Shoffner, TOC today to inquire about LTC bed search or discharge plan.  She spoke with patient's husband who wanted to speak with MD.  His plans are LTC with hospice.  Hospital team will continue to follow through final disposition.   Please do not hesitate to call with any hospice related questions or concerns.   Saddie HILARIO Na, RN Nurse Liaison [phone]

## 2023-08-26 NOTE — Plan of Care (Signed)
  Problem: Clinical Measurements: Goal: Quality of life will improve Outcome: Progressing   Problem: Respiratory: Goal: Verbalizations of increased ease of respirations will increase Outcome: Progressing   Problem: Role Relationship: Goal: Family's ability to cope with current situation will improve Outcome: Progressing Goal: Ability to verbalize concerns, feelings, and thoughts to partner or family member will improve Outcome: Progressing   Problem: Pain Management: Goal: Satisfaction with pain management regimen will improve Outcome: Progressing

## 2023-08-26 NOTE — Progress Notes (Signed)
 Palliative Care Progress Note, Assessment & Plan   Patient Name: Rachel Huber       Date: 08/26/2023 DOB: [birth year]  Age: 68 y.o. MRN#: [id number] Attending Physician: Laurita Pillion, MD Primary Care Physician: Lenon Layman ORN, MD Admit Date: 07/28/2023  Subjective: Patient is lying in bed in no apparent distress.  She acknowledges my presence and is able to make her wishes known.  Her sister is at bedside.  Patient offers no acute complaints at this time.  She is requesting grapes to eat.  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    12/12: Patient has been excepting her lactulose  and having multiple bowel movements.  Mental status remains poor 12/13: No clinical status changes.  Patient is a little bit more awake.  Has been taking her p.o. lactulose . 12/15: Mental status seems close to baseline.  Waiting for PT and OT eval 12/16: PT and OT recommends SNF.  Husband refusing to take her back to [facility].  TOC working on alternative placement 12/17: Patient refusing lactulose  at times.  Ammonia trending up 12/18: Lactulose  enema was given as an option yesterday for oral lactulose  when she is not able to take.  Ammonia 94 12/19: Patient reportedly fell overnight/early morning and has new left intertrochanteric hip fracture.  Going for surgery/OR  today 12/20 s/p left hip cephalmomedullary nail 12/22 GI has been consulted-recommendations for NGT placement to ensure administration of lactulose , liver US  assessing for ascites for potential paracentesis to assess for SBP--recommendations to assess for other sources of encephalopthy 12/30 - family GOC discussion with attending Dr. Caleen - shifted to full comfort measures   Palliative Medicine consulted for assisting with goals of care conversations.  Summary of counseling/coordination of care: Extensive chart review completed prior to meeting patient including labs, vital signs, imaging, progress notes, orders, and available advanced directive documents from current and previous encounters.   After reviewing the patient's chart and assessing the patient at bedside, I spoke with patient and family at bedside in regards to symptom management and goals of care.  Symptoms assessed.  Patient shares she is thirsty would like to keep her throat and mouth wet.  Offered multiple drinks during my visit.  Patient was able to swallow without difficulty.  Family shares concern about patient being advanced to a regular diet.  We discussed that liberalizing her diet was just to ensure she could have a choice of what ever food sounded good to her.  I encouraged them to only give her food that she has requested when she is awake and alert.  Floor stock or order from cafeteria can occur to meet patient's request.  I encouraged him to offer small bites with moistened foods so patient can have more control over a bolus of food.  Shared concern that patient could aspirate but that  this has been an excepted reality and that comfort enjoy food over risk of aspiration has been chosen.  Family expressed understanding and appreciation.  With the patient and family, I spoke with attending Dr. Laurita.  He has ordered lab work for tomorrow morning.  Given that patient is on comfort measures, lab work is not usually drawn as  there are no treatments/interventions with full comfort measures.  Dr. Laurita is spoken with patient's husband who would like to know where patient's liver function is for prognostic purposes.  I then met with patient's husband [name] in person.  He expressed great concern over poor communication over the last week.  He shares he would like to know what the plan is for LTC.  I assured him that TOC is following closely.  As per chart review, TOC is already spoken with [name] in regards to next steps.  Plan remains for full comfort measures and for patient to transfer to LTC with hospice services to follow.  PMT will continue to follow and support patient and family throughout her hospitalization.  Physical Exam Vitals reviewed.  Constitutional:      General: She is not in acute distress.    Appearance: She is normal weight.  HENT:     Head: Normocephalic.     Mouth/Throat:     Mouth: Mucous membranes are moist.  Eyes:     Pupils: Pupils are equal, round, and reactive to light.  Pulmonary:     Effort: Pulmonary effort is normal.  Abdominal:     Palpations: Abdomen is soft.  Skin:    General: Skin is warm and dry.  Neurological:     Mental Status: She is alert.  Psychiatric:        Behavior: Behavior normal.        Thought Content: Thought content normal.        Judgment: Judgment normal.             Total Time 50 minutes   Time spent includes: Detailed review of medical records (labs, imaging, vital signs), medically appropriate exam (mental status, respiratory, cardiac, skin), discussed with treatment team, counseling and educating patient, family and staff, documenting clinical information, medication management and coordination of care.  Lamarr L. Arvid, DNP, FNP-BC Palliative Medicine Team

## 2023-08-26 NOTE — TOC Progression Note (Signed)
 Transition of Care [facility]) - Progression Note    Patient Details  Name: Rachel Huber MRN: [id number] Date of Birth: 1956-03-28  Transition of Care [facility]) CM/SW Contact  Ladene Lady, LCSW Phone Number: 08/26/2023, 12:36 PM  Clinical Narrative:   CSW spoke with husband. Husband very upset with how communication has been. States no one has called in a week. Husband states he is able to private pay the first month at a SNF for LTC if needed. He thinks he may have LTC insurance through his [employer] and is going to check. He wants to speak with a doctor first. He says no preference for LTC at this time but will review options. Offered to give husband number to patient experience but husband wanted to speak with MD.     Expected Discharge Plan: Skilled Nursing Facility Barriers to Discharge: Continued Medical Work up  Expected Discharge Plan and Services                                               Social Determinants of Health (SDOH) Interventions SDOH Screenings   Food Insecurity: Patient Unable To Answer (07/29/2023)  Housing: Patient Unable To Answer (07/29/2023)  Transportation Needs: Patient Unable To Answer (07/29/2023)  Utilities: Patient Unable To Answer (07/29/2023)  Alcohol  Screen: Low Risk  (04/04/2022)  Depression (PHQ2-9): Low Risk  (04/27/2023)  Financial Resource Strain: Low Risk  (06/03/2023)   Received from [facility]  Physical Activity: Inactive (04/04/2022)  Social Connections: Patient Unable To Answer (08/18/2023)  Stress: No Stress Concern Present (04/04/2022)  Tobacco Use: Medium Risk (07/28/2023)    Readmission Risk Interventions    04/30/2023   12:35 PM 12/19/2022   10:27 AM 11/04/2022   11:32 AM  Readmission Risk Prevention Plan  Transportation Screening Complete Complete Complete  Medication Review Oceanographer) Complete Complete Complete  PCP or Specialist appointment within 3-5 days of discharge Not Complete Complete  Complete  HRI or Home Care Consult Complete Complete   SW Recovery Care/Counseling Consult Complete Complete Complete  Palliative Care Screening Not Applicable Not Applicable Not Applicable  Skilled Nursing Facility Not Complete Not Applicable Not Applicable

## 2023-08-26 NOTE — Progress Notes (Signed)
 Progress Note   Patient: Rachel Huber FMW:[id number] DOB: 04/21/1956 DOA: 07/28/2023     29 DOS: the patient was seen and examined on 08/26/2023   Brief hospital course: 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  Patient was diagnosed with hepatic encephalopathy, was given lactulose  and IV fluids.  Patient mental status has been waxing and waning.  Patient had a fall while in the hospital, sustained a new left intertrochanteric hip fracture, had intertrochanteric nail performed on 12/19.  Patient mental status does not improve with continued treatment, seen by palliative care, transition to comfort care on 12/30.  However, for the last 2 days patient seemed too weak.  Restarted diet.   Principal Problem:   Hepatic encephalopathy (HCC) Active Problems:   Chronic kidney disease, stage 3b (HCC)   HLD (hyperlipidemia)   Depression with anxiety   Type 2 diabetes mellitus with hyperlipidemia (HCC)   OSA on CPAP   Anemia in chronic kidney disease (CKD)   Hypokalemia   AMS (altered mental status)   Aortic atherosclerosis (HCC)   Beta thalassemia intermedia (HCC)   Obesity, Class III, BMI 40-49.9 (morbid obesity) (HCC)   Hypomagnesemia   Intertrochanteric fracture of left hip, sequela   Hospice care patient   Assessment and Plan: Hepatic encephalopathy (HCC) Decompensated NASH cirrhosis with ascites. Patient has been treated with lactulose , rifaximin .  Her condition does not improve, transition to comfort care on 12/30.   However, patient is starting waking up about 2 days ago.  Discussed with patient daughter and husband, they want recheck patient is status, and updated them again about patient prognosis.  I believe patient still may be hospice appropriate, but patient is no longer accepted to inpatient hospice care.  As result, patient may need to go to long-term care with hospice.  I will recheck ammonia level and liver function, hemoglobin tomorrow.   Hypernatremia.   Recheck level tomorrow.   Anemia in chronic kidney disease (CKD) Hemoglobin at 7.2. s/p 4 units pRBCs Recheck a CBC tomorrow.    Acute on chronic kidney disease, stage 3b (HCC) Mild metabolic acidosis. Hypokalemia. Hypomagnesemia. Hypophosphatemia    Acute left intertrochanteric hip fracture Status post fall on 12/19 around 4 AM. head CT which was negative for acute findings. Left hip x-ray showed acute fracture -status post left hip cephalomedullary nail in place orthopedics on 12/19 Pain medicine as needed as ordered Aspirin  for DVT prophylaxis Weightbearing as tolerated on the left hip per orthopedic.   Obesity class I. Estimated body mass index is 30.18 kg/m as calculated from the following:   Height as of this encounter: 5' 9 (1.753 m).   Weight as of this encounter: 92.7 kg.      Subjective:  Patient is awake today, but confused.  Maintain a conversation. Still has significant loose stools, rectal tube in place.  Physical Exam: Vitals:   08/25/23 0751 08/25/23 1635 08/25/23 2029 08/26/23 0817  BP: (!) 108/46 (!) 107/45 (!) 107/47 (!) 112/47  Pulse: 64 62 65 79  Resp: 16 16  18   Temp: (!) 97.5 F (36.4 C) 97.6 F (  36.4 C) 98.1 F (36.7 C) 98.3 F (36.8 C)  TempSrc: Axillary Axillary  Oral  SpO2: 98% 100% 97% 96%  Weight:      Height:       General exam: Appears calm and comfortable, obese  Respiratory system: Clear to auscultation. Respiratory effort normal. Cardiovascular system: S1 & S2 heard, RRR. No  JVD, murmurs, rubs, gallops or clicks. No pedal edema. Gastrointestinal system: Abdomen is nondistended, soft and nontender. No organomegaly or masses felt. Normal bowel sounds heard. Central nervous system: Alert and oriented x1. No focal neurological deficits. Extremities: Symmetric 5 x 5 power. Skin: No rashes, lesions or ulcers    Data Reviewed:  Lab results reviewed.  Family Communication: Daughter and husband updated over the phone.  Disposition: Status is: Inpatient Remains inpatient appropriate because: Severity of disease.     Time spent: 50 minutes  Author: Murvin Mana, MD 08/26/2023 12:33 PM  For on call review [email or web address].

## 2023-08-27 DIAGNOSIS — D631 Anemia in chronic kidney disease: Secondary | ICD-10-CM | POA: Diagnosis not present

## 2023-08-27 DIAGNOSIS — N1832 Chronic kidney disease, stage 3b: Secondary | ICD-10-CM | POA: Diagnosis not present

## 2023-08-27 DIAGNOSIS — Z515 Encounter for palliative care: Secondary | ICD-10-CM | POA: Diagnosis not present

## 2023-08-27 DIAGNOSIS — K7682 Hepatic encephalopathy: Secondary | ICD-10-CM | POA: Diagnosis not present

## 2023-08-27 DIAGNOSIS — S72142S Displaced intertrochanteric fracture of left femur, sequela: Secondary | ICD-10-CM | POA: Diagnosis not present

## 2023-08-27 LAB — COMPREHENSIVE METABOLIC PANEL
ALT: 64 U/L — ABNORMAL HIGH (ref 0–44)
AST: 79 U/L — ABNORMAL HIGH (ref 15–41)
Albumin: 2.3 g/dL — ABNORMAL LOW (ref 3.5–5.0)
Alkaline Phosphatase: 159 U/L — ABNORMAL HIGH (ref 38–126)
Anion gap: 7 (ref 5–15)
BUN: 17 mg/dL (ref 8–23)
CO2: 23 mmol/L (ref 22–32)
Calcium: 8.8 mg/dL — ABNORMAL LOW (ref 8.9–10.3)
Chloride: 102 mmol/L (ref 98–111)
Creatinine, Ser: 0.78 mg/dL (ref 0.44–1.00)
GFR, Estimated: >60 mL/min (ref >60)
Glucose, Bld: 88 mg/dL (ref 70–99)
Potassium: 3.9 mmol/L (ref 3.5–5.1)
Sodium: 132 mmol/L — ABNORMAL LOW (ref 135–145)
Total Bilirubin: 2 mg/dL — ABNORMAL HIGH (ref 0.0–1.2)
Total Protein: 5.4 g/dL — ABNORMAL LOW (ref 6.5–8.1)

## 2023-08-27 LAB — MAGNESIUM: Magnesium: 1.8 mg/dL (ref 1.7–2.4)

## 2023-08-27 LAB — PHOSPHORUS: Phosphorus: 3 mg/dL (ref 2.5–4.6)

## 2023-08-27 LAB — AMMONIA: Ammonia: 35 umol/L (ref 9–35)

## 2023-08-27 MED ORDER — ENSURE ENLIVE PO LIQD
237.0000 mL | Freq: Two times a day (BID) | ORAL | Status: DC
  Administered 2023-08-27 – 2023-09-03 (×8): 237 mL via ORAL

## 2023-08-27 NOTE — Care Management Important Message (Signed)
 Important Message  Patient Details  Name: Rachel Huber MRN: [id number] Date of Birth: Jul 11, 1956   Important Message Given:  Yes - Medicare IM     Catheleen Langhorne W, CMA 08/27/2023, 11:20 AM

## 2023-08-27 NOTE — Progress Notes (Signed)
 Family at bedside visiting with pt and feeding pt.

## 2023-08-27 NOTE — Progress Notes (Signed)
 [facility] Liaison Note:    Hospital Liaison team will follow peripherally until a LTC bed has been found for patient.  Hospice to follow at LTC facility.     Please don't hesitate to call with any Hospice related questions or concerns.    Thank you for the opportunity to participate in this patient's care.  [facility] Liaison [phone]

## 2023-08-27 NOTE — Plan of Care (Signed)
 Pt alert to self on comfort measures. Dressing to IV central port changed today. Pt had family visit. Pt resting in bed.  Problem: Clinical Measurements: Goal: Quality of life will improve Outcome: Not Progressing   Problem: Respiratory: Goal: Verbalizations of increased ease of respirations will increase Outcome: Not Progressing   Problem: Role Relationship: Goal: Family's ability to cope with current situation will improve Outcome: Not Progressing Goal: Ability to verbalize concerns, feelings, and thoughts to partner or family member will improve Outcome: Not Progressing   Problem: Pain Management: Goal: Satisfaction with pain management regimen will improve Outcome: Not Progressing

## 2023-08-27 NOTE — Progress Notes (Signed)
 Palliative Care Progress Note, Assessment & Plan   Patient Name: Rachel Huber       Date: 08/27/2023 DOB: 1956/03/24  Age: 68 y.o. MRN#: [id number] Attending Physician: Laurita Pillion, MD Primary Care Physician: Lenon Layman ORN, MD Admit Date: 07/28/2023  Subjective: Patient is lying in bed in no apparent distress.  She awakens to my presence and quickly returns back to sleep.  Respirations are even and unlabored.  No family or friends present during my visit.  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    12/12: Patient has been excepting her lactulose  and having multiple bowel movements.  Mental status remains poor 12/13: No clinical status changes.  Patient is a little bit more awake.  Has been taking her p.o. lactulose . 12/15: Mental status seems close to baseline.  Waiting for PT and OT eval 12/16: PT and OT recommends SNF.  Husband refusing to take her back to [facility].  TOC working on alternative placement 12/17: Patient refusing lactulose  at times.  Ammonia trending up 12/18: Lactulose  enema was given as an option yesterday for oral lactulose  when she is not able to take.  Ammonia 94 12/19: Patient reportedly fell overnight/early morning and has new left intertrochanteric hip fracture.  Going for surgery/OR today 12/20 s/p left hip  cephalmomedullary nail 12/22 GI has been consulted-recommendations for NGT placement to ensure administration of lactulose , liver US  assessing for ascites for potential paracentesis to assess for SBP--recommendations to assess for other sources of encephalopthy 12/30 - family GOC discussion with attending Dr. Caleen - shifted to full comfort measures   Palliative Medicine consulted for assisting with goals of care conversations.  Summary of counseling/coordination of care: Extensive chart review completed prior to meeting patient including labs, vital signs, imaging, progress notes, orders, and available advanced directive documents from current and previous encounters.   I have reviewed the patient's chart and assessed the patient at bedside.  As needed medications are appropriately managing patient's symptoms at this time.  No adjustment to [facility] needed.  Full comfort measures continue.  Barrier to discharge is placement.  TOC following closely working with husband [name] for LTC placement.  PMT will continue to follow and support patient and family throughout her hospitalization.  Physical Exam Vitals reviewed.  Constitutional:      General: She is not in acute distress.    Appearance: She is normal weight.  HENT:     Head: Normocephalic.  Pulmonary:     Effort: Pulmonary effort is normal.  Abdominal:     Palpations: Abdomen is soft.  Skin:    General: Skin is warm and dry.  Psychiatric:        Mood and Affect: Mood normal.        Judgment: Judgment normal.  Total Time 25 minutes   Time spent includes: Detailed review of medical records (labs, imaging, vital signs), medically appropriate exam (mental status, respiratory, cardiac, skin), discussed with treatment team, counseling and educating patient, family and staff, documenting clinical information, medication management and coordination of care.  Lamarr L. Arvid, DNP, FNP-BC Palliative Medicine Team

## 2023-08-27 NOTE — TOC Progression Note (Signed)
 Transition of Care [facility]) - Progression Note    Patient Details  Name: Rachel Huber MRN: [id number] Date of Birth: [birth year]  Transition of Care [facility]) CM/SW Contact  Ladene Lady, LCSW Phone Number: 08/27/2023, 9:33 AM  Clinical Narrative:   CSW spoke with husband who states he does have LTC benefits and would like [facility]. CSW has left a VM for their admissions.    Expected Discharge Plan: Skilled Nursing Facility Barriers to Discharge: Continued Medical Work up  Expected Discharge Plan and Services                                               Social Determinants of Health (SDOH) Interventions SDOH Screenings   Food Insecurity: Patient Unable To Answer (07/29/2023)  Housing: Patient Unable To Answer (07/29/2023)  Transportation Needs: Patient Unable To Answer (07/29/2023)  Utilities: Patient Unable To Answer (07/29/2023)  Alcohol  Screen: Low Risk  (04/04/2022)  Depression (PHQ2-9): Low Risk  (04/27/2023)  Financial Resource Strain: Low Risk  (06/03/2023)   Received from [facility]  Physical Activity: Inactive (04/04/2022)  Social Connections: Patient Unable To Answer (08/18/2023)  Stress: No Stress Concern Present (04/04/2022)  Tobacco Use: Medium Risk (07/28/2023)    Readmission Risk Interventions    04/30/2023   12:35 PM 12/19/2022   10:27 AM 11/04/2022   11:32 AM  Readmission Risk Prevention Plan  Transportation Screening Complete Complete Complete  Medication Review Oceanographer) Complete Complete Complete  PCP or Specialist appointment within 3-5 days of discharge Not Complete Complete Complete  HRI or Home Care Consult Complete Complete   SW Recovery Care/Counseling Consult Complete Complete Complete  Palliative Care Screening Not Applicable Not Applicable Not Applicable  Skilled Nursing Facility Not Complete Not Applicable Not Applicable

## 2023-08-27 NOTE — Progress Notes (Signed)
 Progress Note   Patient: Rachel Huber FMW:[id number] DOB: 07/09/1956 DOA: 07/28/2023     30 DOS: the patient was seen and examined on 08/27/2023   Brief hospital course: 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  Patient was diagnosed with hepatic encephalopathy, was given lactulose  and IV fluids.  Patient mental status has been waxing and waning.  Patient had a fall while in the hospital, sustained a new left intertrochanteric hip fracture, had intertrochanteric nail performed on 12/19.  Patient mental status does not improve with continued treatment, seen by palliative care, transition to comfort care on 12/30.  However, patient woke up since 1/7, restarted diet 1/8.  However, patient appetite is very poor.  Still appropriate for hospice.  Looking for long-term care with hospice.   Principal Problem:   Hepatic encephalopathy (HCC) Active Problems:   Chronic kidney disease, stage 3b (HCC)   HLD (hyperlipidemia)   Depression with anxiety   Thrombocytopenia (HCC)   Type 2 diabetes mellitus with hyperlipidemia (HCC)   OSA on CPAP   Anemia in chronic kidney disease (CKD)   Hypokalemia   AMS (altered mental status)   Aortic atherosclerosis (HCC)   Beta thalassemia intermedia (HCC)   Obesity, Class III, BMI 40-49.9 (morbid obesity) (HCC)   Hypomagnesemia   Intertrochanteric fracture of left hip, sequela   Hospice care patient   Assessment and Plan: Hepatic encephalopathy (HCC) Decompensated NASH cirrhosis with  ascites. Patient has been treated with lactulose , rifaximin .  Her condition does not improve, transition to comfort care on 12/30.  However, patient is starting waking up about 2 days ago.  Discussed with patient daughter and husband, they want recheck patient is status, and updated them again about patient prognosis.  I believe patient still may be hospice appropriate, but patient is no longer accepted to inpatient hospice care.  As result, patient may need to go to long-term care with hospice.  Per family request, recheck the labs, patient still has elevated ammonia, still very confused.  He has very little p.o. intake, at this point, patient is still appropriate for hospice.  Discussed with TOC, looking for long-term care.   Hypernatremia.   Resolved.   Anemia in chronic kidney disease (CKD) Hemoglobin at 7.2. s/p 4 units pRBCs Still hospice appropriate, will not recheck.    Acute on chronic kidney disease, stage 3b (HCC) Mild metabolic acidosis. Hypokalemia. Hypomagnesemia. Hypophosphatemia Condition was stable.  Renal function has improved.   Acute left intertrochanteric hip fracture Status post fall on 12/19 around 4 AM. head CT which was negative for acute findings. Left hip x-ray showed acute fracture -status post left hip cephalomedullary nail in place orthopedics on 12/19 Pain medicine as needed as ordered Aspirin  for DVT prophylaxis Weightbearing as tolerated on the left hip per orthopedic.   Obesity class I. Estimated body mass index is 30.18 kg/m as calculated from the following:   Height as of this encounter: 5' 9 (1.753 m).   Weight as of this encounter: 92.7 kg.  Pressure ulcer POA Pressure Injury 06/25/23 Sacrum Medial Stage 1 -  Intact skin with non-blanchable  redness of a localized area usually over a bony prominence. redness (Active)  06/25/23 1700  Location: Sacrum  Location Orientation: Medial  Staging: Stage 1 -  Intact skin with non-blanchable redness of a  localized area usually over a bony prominence.  Wound Description (Comments): redness  Present on Admission: Yes        Subjective:  Patient is still very confused, very little p.o. intake.  Physical Exam: Vitals:   08/25/23 2029 08/26/23 0817 08/26/23 2001 08/27/23 0732  BP: (!) 107/47 (!) 112/47 (!) 104/53 (!) 109/29  Pulse: 65 79 71 72  Resp:  18  16  Temp: 98.1 F (36.7 C) 98.3 F (36.8 C) 98.3 F (36.8 C) 98 F (36.7 C)  TempSrc:  Oral    SpO2: 97% 96% 99% 96%  Weight:      Height:       General exam: Appears calm and comfortable  Respiratory system: Clear to auscultation. Respiratory effort normal. Cardiovascular system: S1 & S2 heard, RRR. No JVD, murmurs, rubs, gallops or clicks. No pedal edema. Gastrointestinal system: Abdomen is nondistended, soft and nontender. No organomegaly or masses felt. Normal bowel sounds heard. Central nervous system: Alert and oriented x1 Extremities: Symmetric 5 x 5 power. Skin: No rashes, lesions or ulcers Psychiatry: Flat affect   Data Reviewed:  Lab results reviewed.  Family Communication: Daughter updated again over the phone.  Disposition: Status is: Inpatient Remains inpatient appropriate because:  unsafe discharge.     Time spent: 35 minutes  Author: Murvin Mana, MD 08/27/2023 12:37 PM  For on call review [email or web address].

## 2023-08-27 NOTE — Progress Notes (Signed)
 Attempted to draw blood from Rt chest port and it would not draw, however it did flush well. Lab notified.

## 2023-08-27 NOTE — Plan of Care (Signed)
  Problem: Clinical Measurements: Goal: Quality of life will improve Outcome: Progressing   Problem: Respiratory: Goal: Verbalizations of increased ease of respirations will increase Outcome: Progressing   Problem: Role Relationship: Goal: Family's ability to cope with current situation will improve Outcome: Progressing Goal: Ability to verbalize concerns, feelings, and thoughts to partner or family member will improve Outcome: Progressing   Problem: Pain Management: Goal: Satisfaction with pain management regimen will improve Outcome: Progressing

## 2023-08-28 DIAGNOSIS — N1832 Chronic kidney disease, stage 3b: Secondary | ICD-10-CM | POA: Diagnosis not present

## 2023-08-28 DIAGNOSIS — D631 Anemia in chronic kidney disease: Secondary | ICD-10-CM | POA: Diagnosis not present

## 2023-08-28 DIAGNOSIS — K7682 Hepatic encephalopathy: Secondary | ICD-10-CM | POA: Diagnosis not present

## 2023-08-28 DIAGNOSIS — Z515 Encounter for palliative care: Secondary | ICD-10-CM | POA: Diagnosis not present

## 2023-08-28 DIAGNOSIS — S72142S Displaced intertrochanteric fracture of left femur, sequela: Secondary | ICD-10-CM | POA: Diagnosis not present

## 2023-08-28 NOTE — Progress Notes (Signed)
 Daily Progress Note   Patient Name: Rachel Huber       Date: 08/28/2023 DOB: 06/13/1956  Age: 68 y.o. 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    12/12: Patient has been excepting her lactulose  and having multiple bowel movements.  Mental status remains poor 12/13: No clinical status changes.  Patient is a little bit more awake.  Has been taking her p.o. lactulose . 12/15: Mental status seems close to baseline.  Waiting for PT and OT eval 12/16: PT and OT recommends SNF.  Husband refusing to take her back to [facility].  TOC working on alternative placement 12/17: Patient refusing lactulose  at times.  Ammonia trending up 12/18: Lactulose  enema was given as an option yesterday for oral lactulose  when she is not able to take.  Ammonia 94 12/19: Patient reportedly fell overnight/early morning and has new left intertrochanteric hip fracture.  Going for surgery/OR today 12/20 s/p left hip cephalmomedullary nail 12/22 GI has been consulted-recommendations for NGT placement to ensure administration of lactulose , liver US  assessing for  ascites for potential paracentesis to assess for SBP--recommendations to assess for other sources of encephalopthy 12/30 - family GOC discussion with attending Dr. Caleen - shifted to full comfort measures  1/10 Awaiting LTC placement with hospice services following   Palliative Medicine consulted for assisting with goals of care conversations.   Subjective: Extensive chart review has been completed prior to meeting patient including labs, vital signs, imaging, progress notes, orders, and available advanced directive documents from current and previous encounters.    Visited with Ms. Appleton at her bedside. Nursing staff at bedside assisting with hygiene. No family at bedside during time of visit.  Review of MAR, current medication regimen maintaining comfort as comfort measures remain in place, no recommendations to medication  adjustments at this time.  TOC actively engaged in seeking LTC placement with hospice services following.  PMT will continue to follow for ongoing needs and support.  Thank you for allowing the Palliative Medicine Team to assist in the care of this patient.  Total time:  25 minutes  Time spent includes: Detailed review of medical records (labs, imaging, vital signs), medically appropriate exam (mental status, respiratory, cardiac, skin), discussed with treatment team, counseling and educating patient, family and staff, documenting clinical information, medication management and coordination of care.  Waddell Lesches, DNP, AGNP-C Palliative Medicine   Please contact Palliative Medicine Team phone at [phone] for questions and concerns.

## 2023-08-28 NOTE — Progress Notes (Signed)
 Pt was fed a cup of sherbet, however while feeding her it was noted that she kept trying to eat her gown, thinking that she was eating her sherbet. She kept talking to [facility] who was not there. She took out her purewick and was holding it by her face and asked how long it was supposed to be. Did c/o severe pain in her back and has a difficult time with HOB going up or down or repositioning. PRN dilauded administered.

## 2023-08-28 NOTE — Progress Notes (Signed)
 [facility] Liaison Note:    Hospital Liaison team will follow peripherally until a LTC bed has been found for patient.  Hospice to follow at LTC facility.     Please don't hesitate to call with any Hospice related questions or concerns.    Thank you for the opportunity to participate in this patient's care.  [facility] Liaison [phone]

## 2023-08-28 NOTE — Plan of Care (Signed)
  Problem: Clinical Measurements: Goal: Quality of life will improve Outcome: Progressing   Problem: Respiratory: Goal: Verbalizations of increased ease of respirations will increase Outcome: Progressing   Problem: Role Relationship: Goal: Family's ability to cope with current situation will improve Outcome: Progressing Goal: Ability to verbalize concerns, feelings, and thoughts to partner or family member will improve Outcome: Progressing   Problem: Pain Management: Goal: Satisfaction with pain management regimen will improve Outcome: Progressing

## 2023-08-28 NOTE — Progress Notes (Signed)
 Progress Note   Patient: Rachel Huber FMW:[id number] DOB: 06-Feb-1956 DOA: 07/28/2023     31 DOS: the patient was seen and examined on 08/28/2023   Brief hospital course: 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  Patient was diagnosed with hepatic encephalopathy, was given lactulose  and IV fluids.  Patient mental status has been waxing and waning.  Patient had a fall while in the hospital, sustained a new left intertrochanteric hip fracture, had intertrochanteric nail performed on 12/19.  Patient mental status does not improve with continued treatment, seen by palliative care, transition to comfort care on 12/30.  However, patient woke up since 1/7, restarted diet 1/8.  However, patient appetite is very poor.  Still appropriate for hospice.  Looking for long-term care with hospice.   Principal Problem:   Hepatic encephalopathy (HCC) Active Problems:   Chronic kidney disease, stage 3b (HCC)   HLD (hyperlipidemia)   Depression with anxiety   Thrombocytopenia (HCC)   Type 2 diabetes mellitus with hyperlipidemia (HCC)   OSA on CPAP   Anemia in chronic kidney disease (CKD)   Hypokalemia   AMS (altered mental status)   Aortic atherosclerosis (HCC)   Beta thalassemia intermedia (HCC)   Obesity, Class III, BMI 40-49.9 (morbid obesity) (HCC)   Hypomagnesemia   Intertrochanteric fracture of left hip, sequela   Hospice care patient   Assessment and Plan: Hepatic encephalopathy (HCC) Decompensated NASH cirrhosis with  ascites. Patient has been treated with lactulose , rifaximin .  Her condition does not improve, transition to comfort care on 12/30.  However, patient is starting waking up about 2 days ago.  Discussed with patient daughter and husband, they want recheck patient is status, and updated them again about patient prognosis.  I believe patient still may be hospice appropriate, but patient is no longer accepted to inpatient hospice care.  As result, patient may need to go to long-term care with hospice.  Per family request, recheck the labs, patient still has elevated ammonia, still very confused.  He has very little p.o. intake, at this point, patient is still appropriate for hospice.  Discussed with TOC, looking for long-term care.  Prognosis is probably a few weeks. There is no change in patient mental status today.  Still appropriate for comfort care.   Hypernatremia.   Resolved.   Anemia in chronic kidney disease (CKD) Hemoglobin at 7.2. s/p 4 units pRBCs Still hospice appropriate, will not recheck.    Acute on chronic kidney disease, stage 3b (HCC) Mild metabolic acidosis. Hypokalemia. Hypomagnesemia. Hypophosphatemia Condition was stable.  Renal function has improved.   Acute left intertrochanteric hip fracture Status post fall on 12/19 around 4 AM. head CT which was negative for acute findings. Left hip x-ray showed acute fracture -status post left hip cephalomedullary nail in place orthopedics on 12/19 Pain medicine as needed as ordered Aspirin  for DVT prophylaxis Weightbearing as tolerated on the left hip per orthopedic.   Obesity class I. Estimated body mass index is 30.18 kg/m as calculated from the following:   Height as of this encounter: 5' 9 (1.753 m).   Weight as  of this encounter: 92.7 kg.   Pressure ulcer POA Pressure Injury 06/25/23 Sacrum Medial Stage 1 -  Intact skin with non-blanchable redness of a localized area usually over a bony prominence. redness (Active)   06/25/23 1700  Location: Sacrum  Location Orientation: Medial  Staging: Stage 1 -  Intact skin with non-blanchable redness of a localized area usually over a bony prominence.  Wound Description (Comments): redness  Present on Admission: Yes        Subjective:  Patient can maintain a conversation, but overall very confused.  Physical Exam: Vitals:   08/26/23 2001 08/27/23 0732 08/27/23 1953 08/28/23 0804  BP: (!) 104/53 (!) 109/29 (!) 109/47 (!) 96/51  Pulse: 71 72 81 72  Resp:  16 18 14   Temp: 98.3 F (36.8 C) 98 F (36.7 C) 100 F (37.8 C) 98.9 F (37.2 C)  TempSrc:      SpO2: 99% 96% 100% 94%  Weight:      Height:       General exam: Appears calm and comfortable  Respiratory system: Clear to auscultation. Respiratory effort normal. Cardiovascular system: S1 & S2 heard, RRR. No JVD, murmurs, rubs, gallops or clicks. No pedal edema. Gastrointestinal system: Abdomen is nondistended, soft and nontender. No organomegaly or masses felt. Normal bowel sounds heard. Central nervous system: Alert and confused. Extremities: Symmetric 5 x 5 power. Skin: No rashes, lesions or ulcers    Data Reviewed:  No new labs.  Family Communication: None  Disposition: Status is: Inpatient Remains inpatient appropriate because: Comfort care.     Time spent: 35 minutes  Author: Murvin Mana, MD 08/28/2023 3:43 PM  For on call review [email or web address].

## 2023-08-28 NOTE — Plan of Care (Signed)
  Problem: Respiratory: Goal: Verbalizations of increased ease of respirations will increase Outcome: Progressing   Problem: Pain Management: Goal: Satisfaction with pain management regimen will improve Outcome: Progressing   

## 2023-08-29 DIAGNOSIS — S72142S Displaced intertrochanteric fracture of left femur, sequela: Secondary | ICD-10-CM | POA: Diagnosis not present

## 2023-08-29 DIAGNOSIS — D561 Beta thalassemia: Secondary | ICD-10-CM | POA: Diagnosis not present

## 2023-08-29 DIAGNOSIS — Z515 Encounter for palliative care: Secondary | ICD-10-CM | POA: Diagnosis not present

## 2023-08-29 DIAGNOSIS — N1832 Chronic kidney disease, stage 3b: Secondary | ICD-10-CM | POA: Diagnosis not present

## 2023-08-29 DIAGNOSIS — K7682 Hepatic encephalopathy: Secondary | ICD-10-CM | POA: Diagnosis not present

## 2023-08-29 NOTE — Plan of Care (Signed)
  Problem: Clinical Measurements: Goal: Quality of life will improve Outcome: Not Progressing   Problem: Respiratory: Goal: Verbalizations of increased ease of respirations will increase Outcome: Not Progressing   Problem: Role Relationship: Goal: Family's ability to cope with current situation will improve Outcome: Not Progressing Goal: Ability to verbalize concerns, feelings, and thoughts to partner or family member will improve Outcome: Not Progressing   Problem: Pain Management: Goal: Satisfaction with pain management regimen will improve Outcome: Not Progressing

## 2023-08-29 NOTE — Progress Notes (Signed)
 Progress Note   Patient: Rachel Huber FMW:[id number] DOB: 12-04-1955 DOA: 07/28/2023     32 DOS: the patient was seen and examined on 08/29/2023   Brief hospital course: Ms. 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  Patient was diagnosed with hepatic encephalopathy, was given lactulose  and IV fluids.  Patient mental status has been waxing and waning.  Patient had a fall while in the hospital, sustained a new left intertrochanteric hip fracture, had intertrochanteric nail performed on 12/19.  Patient mental status does not improve with continued treatment, seen by palliative care, transition to comfort care on 12/30.  However, patient woke up since 1/7, restarted diet 1/8.  However, patient appetite is very poor.  Still appropriate for hospice.  Looking for long-term care with hospice.   Principal Problem:   Hepatic encephalopathy (HCC) Active Problems:   Chronic kidney disease, stage 3b (HCC)   HLD (hyperlipidemia)   Depression with anxiety   Thrombocytopenia (HCC)   Type 2 diabetes mellitus with hyperlipidemia (HCC)   OSA on CPAP   Anemia in chronic kidney disease (CKD)   Hypokalemia   AMS (altered mental status)   Aortic atherosclerosis (HCC)   Beta thalassemia intermedia (HCC)   Obesity, Class III, BMI 40-49.9 (morbid obesity) (HCC)   Hypomagnesemia   Intertrochanteric fracture of left hip, sequela   Hospice care patient   Assessment and Plan:  Hepatic encephalopathy (HCC) Decompensated NASH cirrhosis with  ascites. Patient has been treated with lactulose , rifaximin .  Her condition does not improve, transition to comfort care on 12/30.  However, patient is starting waking up about 2 days ago.  Discussed with patient daughter and husband, they want recheck patient is status, and updated them again about patient prognosis.  I believe patient still may be hospice appropriate, but patient is no longer accepted to inpatient hospice care.  As result, patient may need to go to long-term care with hospice.  Per family request, recheck the labs, patient still has elevated ammonia, still very confused.  He has very little p.o. intake, at this point, patient is still appropriate for hospice.  Discussed with TOC, looking for long-term care.  Prognosis is probably a few weeks. There is no change in patient mental status today.  Still appropriate for comfort care.   Hypernatremia.   Resolved.   Anemia in chronic kidney disease (CKD) Hemoglobin at 7.2. s/p 4 units pRBCs Still hospice appropriate, will not recheck.    Acute on chronic kidney disease, stage 3b (HCC) Mild metabolic acidosis. Hypokalemia. Hypomagnesemia. Hypophosphatemia Condition was stable.  Renal function has improved.   Acute left intertrochanteric hip fracture Status post fall on 12/19 around 4 AM. head CT which was negative for acute findings. Left hip x-ray showed acute fracture -status post left hip cephalomedullary nail in place orthopedics on 12/19 Pain medicine as needed as ordered Aspirin  for DVT prophylaxis Weightbearing as tolerated on the left hip per orthopedic.   Obesity class I. Estimated body mass index is 30.18 kg/m as calculated from the following:   Height as of this encounter: 5' 9 (1.753 m).   Weight  as of this encounter: 92.7 kg.   Pressure ulcer POA Pressure Injury 06/25/23 Sacrum Medial Stage 1 -  Intact skin with non-blanchable redness of a localized area usually over a bony prominence. redness (Active)   06/25/23 1700  Location: Sacrum  Location Orientation: Medial  Staging: Stage 1 -  Intact skin with non-blanchable redness of a localized area usually over a bony prominence.  Wound Description (Comments): redness  Present on Admission: Yes      There is no change in patient condition, minimally responsive, very poor appetite.  Continue comfort care.       Subjective: Drowsy, confused.  Physical Exam: Vitals:   08/27/23 0732 08/27/23 1953 08/28/23 0804 08/29/23 0622  BP: (!) 109/29 (!) 109/47 (!) 96/51 (!) 103/49  Pulse: 72 81 72 69  Resp: 16 18 14 14   Temp: 98 F (36.7 C) 100 F (37.8 C) 98.9 F (37.2 C) 98.4 F (36.9 C)  TempSrc:    Oral  SpO2: 96% 100% 94% 95%  Weight:      Height:       General exam: Appears calm and comfortable  Respiratory system: Clear to auscultation. Respiratory effort normal. Cardiovascular system: S1 & S2 heard, RRR. No JVD, murmurs, rubs, gallops or clicks. No pedal edema. Gastrointestinal system: Abdomen is nondistended, soft and nontender. No organomegaly or masses felt. Normal bowel sounds heard. Central nervous system: Drowsy and confused. Extremities: Symmetric 5 x 5 power. Skin: No rashes, lesions or ulcers    Data Reviewed:  There are no new results to review at this time.  Family Communication: None  Disposition: Status is: Inpatient Remains inpatient appropriate because: Comfort care.     Time spent: 35 minutes  Author: Murvin Mana, MD 08/29/2023 12:01 PM  For on call review [email or web address].

## 2023-08-29 NOTE — Progress Notes (Signed)
 Daily Progress Note   Patient Name: Rachel Huber       Date: 08/29/2023 DOB: 1956/08/14  Age: 68 y.o. 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    12/12: Patient has been excepting her lactulose  and having multiple bowel movements.  Mental status remains poor 12/13: No clinical status changes.  Patient is a little bit more awake.  Has been taking her p.o. lactulose . 12/15: Mental status seems close to baseline.  Waiting for PT and OT eval 12/16: PT and OT recommends SNF.  Husband refusing to take her back to [facility].  TOC working on alternative placement 12/17: Patient refusing lactulose  at times.  Ammonia trending up 12/18: Lactulose  enema was given as an option yesterday for oral lactulose  when she is not able to take.  Ammonia 94 12/19: Patient reportedly fell overnight/early morning and has new left intertrochanteric hip fracture.  Going for surgery/OR today 12/20 s/p left hip cephalmomedullary nail 12/22 GI has been consulted-recommendations for NGT placement to ensure administration of lactulose , liver US  assessing for  ascites for potential paracentesis to assess for SBP--recommendations to assess for other sources of encephalopthy 12/30 - family GOC discussion with attending Dr. Caleen - shifted to full comfort measures   1/10-1/11 Awaiting LTC placement with hospice services following   Palliative Medicine consulted for assisting with goals of care conversations.  Subjective: Extensive chart review has been completed prior to meeting patient including labs, vital signs, imaging, progress notes, orders, and available advanced directive documents from current and previous encounters.    Visited with Ms. Kiper at her bedside. She is resting in bed with eyes closed but acknowledges my presence when calling her name. She shares she is feeling well today, denies acute complaints. No family at bedside during time of visit.  Review of MAR, no recommendations for medication changes at this time as comfort is being maintained on current regimen.  TOC working on LTC placement with hospice services to follow.  Thank you for allowing the Palliative Medicine Team to assist in the care of this patient.  Total time:  25 minutes  Time spent includes: Detailed review of medical records (labs, imaging, vital signs), medically appropriate exam (mental status, respiratory, cardiac, skin), discussed with treatment team, counseling and educating patient, family and staff, documenting clinical information, medication management and coordination of care.  Waddell Lesches, DNP, AGNP-C Palliative Medicine   Please contact Palliative Medicine Team phone at [phone] for questions and concerns.

## 2023-08-30 DIAGNOSIS — K7682 Hepatic encephalopathy: Secondary | ICD-10-CM | POA: Diagnosis not present

## 2023-08-30 DIAGNOSIS — Z515 Encounter for palliative care: Secondary | ICD-10-CM | POA: Diagnosis not present

## 2023-08-30 DIAGNOSIS — G4733 Obstructive sleep apnea (adult) (pediatric): Secondary | ICD-10-CM | POA: Diagnosis not present

## 2023-08-30 DIAGNOSIS — N1832 Chronic kidney disease, stage 3b: Secondary | ICD-10-CM | POA: Diagnosis not present

## 2023-08-30 DIAGNOSIS — D561 Beta thalassemia: Secondary | ICD-10-CM | POA: Diagnosis not present

## 2023-08-30 DIAGNOSIS — D631 Anemia in chronic kidney disease: Secondary | ICD-10-CM | POA: Diagnosis not present

## 2023-08-30 NOTE — Progress Notes (Signed)
 Daily Progress Note   Patient Name: Rachel Huber       Date: 08/30/2023 DOB: 1955/08/31  Age: 68 y.o. 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    12/12: Patient has been excepting her lactulose  and having multiple bowel movements.  Mental status remains poor 12/13: No clinical status changes.  Patient is a little bit more awake.  Has been taking her p.o. lactulose . 12/15: Mental status seems close to baseline.  Waiting for PT and OT eval 12/16: PT and OT recommends SNF.  Husband refusing to take her back to [facility].  TOC working on alternative placement 12/17: Patient refusing lactulose  at times.  Ammonia trending up 12/18: Lactulose  enema was given as an option yesterday for oral lactulose  when she is not able to take.  Ammonia 94 12/19: Patient reportedly fell overnight/early morning and has new left intertrochanteric hip fracture.  Going for surgery/OR today 12/20 s/p left hip cephalmomedullary nail 12/22 GI has been consulted-recommendations for NGT placement to ensure administration of lactulose , liver US  assessing for  ascites for potential paracentesis to assess for SBP--recommendations to assess for other sources of encephalopthy 12/30 - family GOC discussion with attending Dr. Caleen - shifted to full comfort measures   1/10-1/12 Awaiting LTC placement with hospice services following   Palliative Medicine consulted for assisting with goals of care conversations.  Subjective: Extensive chart review has been completed prior to meeting patient including labs, vital signs, imaging, progress notes, orders, and available advanced directive documents from current and previous encounters.    Visited with [name] at her bedside. She is resting in bed with eyes closed, does not acknowledge my presence in room. No family at bedside during time of visit.  Called and spoke with [name], comfort care remains, he is  working with [facility] and local facilities and remains in process of searching for LTC facility bed with hospice services to follow. Answered and addressed all questions and concerns.   Review of MAR, symptom burden remains low at this time, no recommendations to current medication regimen to maintain comfort.  PMT to continue to follow for ongoing needs and support.  Thank you for allowing the Palliative Medicine Team to assist in the care of this patient.  Total time:  25 minutes  Time spent includes: Detailed review of medical records (labs, imaging, vital signs), medically appropriate exam (mental status, respiratory, cardiac, skin), discussed with treatment team, counseling and educating patient, family and staff, documenting clinical information, medication management and coordination of care.  Waddell Lesches, DNP, AGNP-C Palliative Medicine   Please contact Palliative Medicine Team phone at [phone] for questions and concerns.

## 2023-08-30 NOTE — Progress Notes (Signed)
 [facility] Note  Follow up on new referral for hospice at LTC.  Spoke with [employer], TOC for an update.  Bed search continues.  [facility] and [facility] have declined patient.  [facility] is evaluating.  No family present.  Patient resting with eyes closed.  Hospital Liaison Team will continue to follow through final disposition.  Please do not hesitate to call with any hospice related questions/concerns.  Saddie HILARIO Na, RN Nurse Liaison [phone]

## 2023-08-30 NOTE — TOC Progression Note (Signed)
 Transition of Care [facility]) - Progression Note    Patient Details  Name: Rachel Huber MRN: [id number] Date of Birth: [birth year]  Transition of Care [facility]) CM/SW Contact  Odella Ku, RN Phone Number: 08/30/2023, 2:46 PM  Clinical Narrative:     [name] with [name] at [facility], unable to accept patient.  Spoke with [name] at [facility], they are considering and will follow up tomorrow with [facility] team.   Expected Discharge Plan: Skilled Nursing Facility Barriers to Discharge: Continued Medical Work up  Expected Discharge Plan and Services                                               Social Determinants of Health (SDOH) Interventions SDOH Screenings   Food Insecurity: Patient Unable To Answer (07/29/2023)  Housing: Patient Unable To Answer (07/29/2023)  Transportation Needs: Patient Unable To Answer (07/29/2023)  Utilities: Patient Unable To Answer (07/29/2023)  Alcohol  Screen: Low Risk  (04/04/2022)  Depression (PHQ2-9): Low Risk  (04/27/2023)  Financial Resource Strain: Low Risk  (06/03/2023)   Received from [facility]  Physical Activity: Inactive (04/04/2022)  Social Connections: Patient Unable To Answer (08/18/2023)  Stress: No Stress Concern Present (04/04/2022)  Tobacco Use: Medium Risk (07/28/2023)    Readmission Risk Interventions    04/30/2023   12:35 PM 12/19/2022   10:27 AM 11/04/2022   11:32 AM  Readmission Risk Prevention Plan  Transportation Screening Complete Complete Complete  Medication Review Oceanographer) Complete Complete Complete  PCP or Specialist appointment within 3-5 days of discharge Not Complete Complete Complete  HRI or Home Care Consult Complete Complete   SW Recovery Care/Counseling Consult Complete Complete Complete  Palliative Care Screening Not Applicable Not Applicable Not Applicable  Skilled Nursing Facility Not Complete Not Applicable Not Applicable

## 2023-08-30 NOTE — Plan of Care (Signed)
 Patient complained of pain left rib area was given PRN pain med for comfort. Patient's sister at bedside explained that when the patient is awake to offer flavored water  to drink. RN has communicated nursing team. Will continue to monitor. Repositioned Q2 hours.    Problem: Pain Management: Goal: Satisfaction with pain management regimen will improve Outcome: Progressing   Problem: Role Relationship: Goal: Family's ability to cope with current situation will improve Outcome: Progressing Goal: Ability to verbalize concerns, feelings, and thoughts to partner or family member will improve Outcome: Progressing   Problem: Respiratory: Goal: Verbalizations of increased ease of respirations will increase Outcome: Progressing   Problem: Clinical Measurements: Goal: Quality of life will improve Outcome: Progressing

## 2023-08-30 NOTE — Progress Notes (Signed)
 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  Rachel Huber was diagnosed with hepatic encephalopathy, was given lactulose  and IV fluids.  Rachel Huber mental status has been waxing and waning.  Rachel Huber had a fall while in the hospital, sustained a new left intertrochanteric hip fracture, had intertrochanteric nail performed on 12/19.  Rachel Huber mental status does not improve with continued treatment, seen by palliative care, transition to comfort care on 12/30.  However, Rachel Huber woke up since 1/7, restarted diet 1/8.  However, Rachel Huber appetite is very poor.  Still appropriate for hospice.  Looking for long-term care with hospice.   Principal Problem:   Hepatic encephalopathy (HCC) Active Problems:   Chronic kidney disease, stage 3b (HCC)   HLD (hyperlipidemia)   Depression with anxiety   Thrombocytopenia (HCC)   Type 2 diabetes mellitus with hyperlipidemia (HCC)   OSA on CPAP   Anemia in chronic kidney disease (CKD)   Hypokalemia   AMS (altered mental status)   Aortic atherosclerosis (HCC)   Beta thalassemia intermedia (HCC)   Obesity, Class III, BMI 40-49.9 (morbid obesity) (HCC)   Hypomagnesemia   Intertrochanteric fracture of left hip, sequela   Hospice care Rachel Huber   Assessment and Plan:  Hepatic encephalopathy (HCC) Decompensated NASH cirrhosis with  ascites. Rachel Huber has been treated with lactulose , rifaximin .  Her condition does not improve, transition to comfort care on 12/30.  However, Rachel Huber is starting waking up about 2 days ago.  Discussed with Rachel Huber daughter and husband, they want recheck Rachel Huber is status, and updated them again about Rachel Huber prognosis.  I believe Rachel Huber still may be hospice appropriate, but Rachel Huber is no longer accepted to inpatient hospice care.  As result, Rachel Huber may need to go to long-term care with hospice.  Per family request, recheck the labs, Rachel Huber still has elevated ammonia, still very confused.  He has very little p.o. intake, at this point, Rachel Huber is still appropriate for hospice.  Discussed with TOC, looking for long-term care.  Prognosis is probably a few weeks. There is no change in Rachel Huber mental status today.  Still appropriate for comfort care.   Hypernatremia.   Resolved.   Anemia in chronic kidney disease (CKD) Hemoglobin at 7.2. s/p 4 units pRBCs Still hospice appropriate, will not recheck.    Acute on chronic kidney disease, stage 3b (HCC) Mild metabolic acidosis. Hypokalemia. Hypomagnesemia. Hypophosphatemia Condition was stable.  Renal function has improved.   Acute left intertrochanteric hip fracture Status post fall on 12/19 around 4 AM. head CT which was negative for acute findings. Left hip x-ray showed acute fracture -status post left hip cephalomedullary nail in place orthopedics on 12/19 Pain medicine as needed as ordered Aspirin  for DVT prophylaxis Weightbearing as tolerated on the left hip per orthopedic.   Obesity class I. Estimated body mass index is 30.18 kg/m as calculated from the following:   Height as of this encounter: 5' 9 (1.753 m).   Weight  as of this encounter: 92.7 kg.   Pressure ulcer POA Pressure Injury 06/25/23 Sacrum Medial Stage 1 -  Intact skin with non-blanchable redness of a localized area usually over a bony prominence. redness (Active)   06/25/23 1700  Location: Sacrum  Location Orientation: Medial  Staging: Stage 1 -  Intact skin with non-blanchable redness of a localized area usually over a bony prominence.  Wound Description (Comments): redness  Present on Admission: Yes    Continue comfort care.   Subjective: Rachel Huber opening eyes only.  Very poor appetite.  Physical Exam: Vitals:   08/28/23 0804 08/29/23 0622 08/29/23 2045 08/30/23 0831  BP: (!) 96/51 (!) 103/49 (!) 113/50 122/64  Pulse: 72 69 78 71  Resp: 14 14 18 15   Temp: 98.9 F (37.2 C) 98.4 F (36.9 C) 99.7 F (37.6 C) 99.1 F (37.3 C)  TempSrc:  Oral Oral   SpO2: 94% 95% 97% 93%  Weight:      Height:       General exam: Appears calm and comfortable  Respiratory system: Clear to auscultation. Respiratory effort normal. Cardiovascular system: S1 & S2 heard, RRR. No JVD, murmurs, rubs, gallops or clicks. No pedal edema. Gastrointestinal system: Abdomen is nondistended, soft and nontender. No organomegaly or masses felt. Normal bowel sounds heard. Central nervous system: Drowsy and confused. Extremities: Symmetric 5 x 5 power. Skin: No rashes, lesions or ulcers    Data Reviewed:  There are no new results to review at this time.  Family Communication: Husband updated on the phone.  Disposition: Status is: Inpatient Remains inpatient appropriate because: Comfort care.     Time spent: 35 minutes  Author: Murvin Mana, MD 08/30/2023 2:04 PM  For on call review [email or web address].

## 2023-08-31 DIAGNOSIS — R4182 Altered mental status, unspecified: Secondary | ICD-10-CM | POA: Diagnosis not present

## 2023-08-31 DIAGNOSIS — S72142S Displaced intertrochanteric fracture of left femur, sequela: Secondary | ICD-10-CM | POA: Diagnosis not present

## 2023-08-31 DIAGNOSIS — Z515 Encounter for palliative care: Secondary | ICD-10-CM | POA: Diagnosis not present

## 2023-08-31 DIAGNOSIS — N1832 Chronic kidney disease, stage 3b: Secondary | ICD-10-CM | POA: Diagnosis not present

## 2023-08-31 DIAGNOSIS — K7682 Hepatic encephalopathy: Secondary | ICD-10-CM | POA: Diagnosis not present

## 2023-08-31 NOTE — TOC Progression Note (Signed)
 Transition of Care [facility]) - Progression Note    Patient Details  Name: PORSHIA BLIZZARD MRN: [id number] Date of Birth: [birth year]  [facility]) CM/SW Contact  Ladene Lady, LCSW Phone Number: 08/31/2023, 12:07 PM  Clinical Narrative:   Husband working on LTC at [facility]. Finances are an issue as husband is trying to pull money from 401K.    Expected Discharge Plan: Skilled Nursing Facility Barriers to Discharge: Continued Medical Work up  Expected Discharge Plan and Services                                               Social Determinants of Health (SDOH) Interventions SDOH Screenings   Food Insecurity: Patient Unable To Answer (07/29/2023)  Housing: Patient Unable To Answer (07/29/2023)  Transportation Needs: Patient Unable To Answer (07/29/2023)  Utilities: Patient Unable To Answer (07/29/2023)  Alcohol  Screen: Low Risk  (04/04/2022)  Depression (PHQ2-9): Low Risk  (04/27/2023)  Financial Resource Strain: Low Risk  (06/03/2023)   Received from [facility]  Physical Activity: Inactive (04/04/2022)  Social Connections: Patient Unable To Answer (08/18/2023)  Stress: No Stress Concern Present (04/04/2022)  Tobacco Use: Medium Risk (07/28/2023)    Readmission Risk Interventions    04/30/2023   12:35 PM 12/19/2022   10:27 AM 11/04/2022   11:32 AM  Readmission Risk Prevention Plan  Transportation Screening Complete Complete Complete  Medication Review Oceanographer) Complete Complete Complete  PCP or Specialist appointment within 3-5 days of discharge Not Complete Complete Complete  HRI or Home Care Consult Complete Complete   SW Recovery Care/Counseling Consult Complete Complete Complete  Palliative Care Screening Not Applicable Not Applicable Not Applicable  Skilled Nursing Facility Not Complete Not Applicable Not Applicable

## 2023-08-31 NOTE — Progress Notes (Signed)
 [facility] Note   Follow up on new referral for hospice at LTC.  Per Ladene Lady, TOC- husband is seeking placement at [facility].   Hospital Liaison Team will continue to follow through final disposition.   Please do not hesitate to call with any hospice related questions/concerns.   Saddie HILARIO Na, RN Nurse Liaison [phone]

## 2023-08-31 NOTE — Progress Notes (Signed)
 Palliative Care Progress Note, Assessment & Plan   Patient Name: Rachel Huber       Date: 08/31/2023 DOB: 1956/02/23  Age: 68 y.o. MRN#: [id number] Attending Physician: Laurita Pillion, MD Primary Care Physician: Lenon Layman ORN, MD Admit Date: 07/28/2023  Subjective: Patient is lying in bed, awake, and in no apparent distress.  She acknowledges my presence and is able to make her wishes known.  No family or friends present during my visit.  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   Patient was diagnosed with hepatic encephalopathy, was given lactulose  and IV fluids.  Patient mental status has been waxing and waning.  Patient had a fall while in the hospital, sustained a new left intertrochanteric hip fracture, had intertrochanteric nail performed on 12/19.   Patient mental status does not improve with continued treatment, seen by palliative care, transition to comfort care on 12/30.   However, patient woke up since 1/7, restarted diet 1/8.  However, patient appetite is very poor.  Still appropriate for hospice.  Looking for long-term care with hospice.  Summary of counseling/coordination of care: Extensive chart review completed prior to meeting patient including labs, vital signs, imaging, progress notes, orders, and available  advanced directive documents from current and previous encounters.   After reviewing the patient's chart and assessing the patient at bedside, I spoke with patient in regards to symptom management and plan of care.  Symptoms assessed.  Patient complains of dry mouth.  She endorses that water  and fluids help keep it wet.  Patient offered glass of water .  She drank the entire thing with a straw without issue during my visit.  I also placed wild cherry sparkling water  within her reach.  She was appreciative.  She has no acute complaints of pain, discomfort, constipation, nausea, headache, or other acute ailments at this time.  No adjustment to [facility] needed.  Barrier to discharge remains LTC placement.  TOC following closely and working with husband for LTC placement.  Plan remains for patient to discharge to LTC with hospice services to follow.  PMT will continue to follow and support patient and family throughout her hospitalization.    Physical Exam Vitals reviewed.  Constitutional:      General: She is not in acute distress.    Appearance: She is normal weight.  HENT:     Head: Normocephalic.     Mouth/Throat:     Mouth: Mucous membranes are dry.     Pharynx: Oropharynx is clear.  Eyes:     Pupils: Pupils are equal, round, and reactive to light.  Pulmonary:     Effort: Pulmonary effort is normal.  Abdominal:     Palpations: Abdomen is soft.  Musculoskeletal:     Comments: Generalized weakness  Skin:    General: Skin  is warm and dry.  Neurological:     Mental Status: She is alert.     Comments: Oriented to self and place  Psychiatric:        Mood and Affect: Mood normal.        Behavior: Behavior normal.        Judgment: Judgment normal.             Total Time 25 minutes   Time spent includes: Detailed review of medical records (labs, imaging, vital signs), medically appropriate exam (mental status, respiratory, cardiac, skin), discussed with treatment team, counseling and  educating patient, family and staff, documenting clinical information, medication management and coordination of care.  Lamarr L. Arvid, DNP, FNP-BC Palliative Medicine Team

## 2023-08-31 NOTE — Progress Notes (Signed)
 Progress Note   Patient: Rachel Huber FMW:[id number] DOB: 02/23/1956 DOA: 07/28/2023     34 DOS: the patient was seen and examined on 08/31/2023   Brief hospital course: Ms. 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  Patient was diagnosed with hepatic encephalopathy, was given lactulose  and IV fluids.  Patient mental status has been waxing and waning.  Patient had a fall while in the hospital, sustained a new left intertrochanteric hip fracture, had intertrochanteric nail performed on 12/19.  Patient mental status does not improve with continued treatment, seen by palliative care, transition to comfort care on 12/30.  However, patient woke up since 1/7, restarted diet 1/8.  However, patient appetite is very poor.  Still appropriate for hospice.  Looking for long-term care with hospice.   Principal Problem:   Hepatic encephalopathy (HCC) Active Problems:   Chronic kidney disease, stage 3b (HCC)   HLD (hyperlipidemia)   Depression with anxiety   Thrombocytopenia (HCC)   Type 2 diabetes mellitus with hyperlipidemia (HCC)   OSA on CPAP   Anemia in chronic kidney disease (CKD)   Hypokalemia   AMS (altered mental status)   Aortic atherosclerosis (HCC)   Beta thalassemia intermedia (HCC)   Obesity, Class III, BMI 40-49.9 (morbid obesity) (HCC)   Hypomagnesemia   Intertrochanteric fracture of left hip, sequela   Hospice care patient   Assessment and Plan: Hepatic encephalopathy (HCC) Decompensated NASH cirrhosis with  ascites. Patient has been treated with lactulose , rifaximin .  Her condition does not improve, transition to comfort care on 12/30.  However, patient is starting waking up about 2 days ago.  Discussed with patient daughter and husband, they want recheck patient is status, and updated them again about patient prognosis.  I believe patient still may be hospice appropriate, but patient is no longer accepted to inpatient hospice care.  As result, patient may need to go to long-term care with hospice.  Per family request, recheck the labs, patient still has elevated ammonia, still very confused.  He has very little p.o. intake, at this point, patient is still appropriate for hospice.  Discussed with TOC, looking for long-term care.  Prognosis is probably a few weeks. There is no change in patient mental status today.  Still appropriate for comfort care.   Hypernatremia.   Resolved.   Anemia in chronic kidney disease (CKD) Hemoglobin at 7.2. s/p 4 units pRBCs Still hospice appropriate, will not recheck.    Acute on chronic kidney disease, stage 3b (HCC) Mild metabolic acidosis. Hypokalemia. Hypomagnesemia. Hypophosphatemia Condition was stable.  Renal function has improved.   Acute left intertrochanteric hip fracture Status post fall on 12/19 around 4 AM. head CT which was negative for acute findings. Left hip x-ray showed acute fracture -status post left hip cephalomedullary nail in place orthopedics on 12/19 Pain medicine as needed as ordered Aspirin  for DVT prophylaxis Weightbearing as tolerated on the left hip per orthopedic.   Obesity class I. Estimated body mass index is 30.18 kg/m as calculated from the following:   Height as of this encounter: 5' 9 (1.753 m).   Weight as  of this encounter: 92.7 kg.   Pressure ulcer POA Pressure Injury 06/25/23 Sacrum Medial Stage 1 -  Intact skin with non-blanchable redness of a localized area usually over a bony prominence. redness (Active)   06/25/23 1700  Location: Sacrum  Location Orientation: Medial  Staging: Stage 1 -  Intact skin with non-blanchable redness of a localized area usually over a bony prominence.  Wound Description (Comments): redness  Present on Admission: Yes     Patient is comfortable, very little p.o. intake.  Continue comfort care, anticipating death in 1 to 2 weeks.    Subjective:  Opening eyes only, very little p.o. intake.  Physical Exam: Vitals:   08/28/23 0804 08/29/23 0622 08/29/23 2045 08/30/23 0831  BP: (!) 96/51 (!) 103/49 (!) 113/50 122/64  Pulse: 72 69 78 71  Resp: 14 14 18 15   Temp: 98.9 F (37.2 C) 98.4 F (36.9 C) 99.7 F (37.6 C) 99.1 F (37.3 C)  TempSrc:  Oral Oral   SpO2: 94% 95% 97% 93%  Weight:      Height:       General exam: Appears calm and comfortable, obese Respiratory system: Clear to auscultation. Respiratory effort normal. Cardiovascular system: S1 & S2 heard, RRR. No JVD, murmurs, rubs, gallops or clicks. No pedal edema. Gastrointestinal system: Abdomen is nondistended, soft and nontender. No organomegaly or masses felt. Normal bowel sounds heard. Central nervous system: Drowsy and confused. Extremities: Symmetric Skin: No rashes, lesions or ulcers    Data Reviewed:  There are no new results to review at this time.  Family Communication: None  Disposition: Status is: Inpatient Remains inpatient appropriate because: Comfort care.     Time spent: 25 minutes  Author: Murvin Mana, MD 08/31/2023 12:05 PM  For on call review [email or web address].

## 2023-09-01 DIAGNOSIS — N1832 Chronic kidney disease, stage 3b: Secondary | ICD-10-CM | POA: Diagnosis not present

## 2023-09-01 DIAGNOSIS — S72142S Displaced intertrochanteric fracture of left femur, sequela: Secondary | ICD-10-CM | POA: Diagnosis not present

## 2023-09-01 DIAGNOSIS — K7682 Hepatic encephalopathy: Secondary | ICD-10-CM | POA: Diagnosis not present

## 2023-09-01 LAB — GLUCOSE, CAPILLARY: Glucose-Capillary: 96 mg/dL (ref 70–99)

## 2023-09-01 NOTE — Plan of Care (Signed)
  Problem: Clinical Measurements: Goal: Quality of life will improve Outcome: Progressing   Problem: Respiratory: Goal: Verbalizations of increased ease of respirations will increase Outcome: Progressing   Problem: Role Relationship: Goal: Family's ability to cope with current situation will improve Outcome: Progressing Goal: Ability to verbalize concerns, feelings, and thoughts to partner or family member will improve Outcome: Progressing   Problem: Pain Management: Goal: Satisfaction with pain management regimen will improve Outcome: Progressing

## 2023-09-01 NOTE — Progress Notes (Addendum)
                                                     Palliative Care Progress Note   Patient Name: Rachel Huber       Date: 09/01/2023 DOB: 1955-12-11  Age: 68 y.o. MRN#: [id number] Attending Physician: Laurita Pillion, MD Primary Care Physician: Lenon Layman ORN, MD Admit Date: 07/28/2023  Extensive chart review completed including labs, vital signs, imaging, progress notes, and orders.  Patient remains on full comfort measures.  Last labs on 1/9 revealed ammonia level within normal limits, albumin  at 2.3, total bilirubin at 2.0, and kidney function within normal limits (creatinine 0.78 and GFR greater than 60).  These labs were drawn while on comfort measures to help with prognostication.  Patient continues to have poor p.o. intake with minimal sips and few bites throughout the day.  She remains appropriate for hospice services but not appropriate for hospice inpatient unit at this time.  TOC is following closely working with husband to find long-term care placement.  [facility] care also following closely so patient will have hospice services when LTC is found.  Symptom burden is low.  She has offered no complaints in the last 8 palliative care visits.   Goals are clear.  No acute palliative needs at this time.  Patient's husband [name], patient's sister, and patient have PMT contact info and have been advised to reach out with any acute palliative needs.  PMT will remain available to patient/family, shadow her chart and monitor her peripherally. Attending Dr. Laurita made aware.  Please re-engage with PMT if goals change, at patient/family's request, or if patient's health deteriorates during hospitalization.    Thank you for allowing the Palliative Medicine Team to assist in the care of Edison A Cramer.  Lamarr L. Arvid, DNP, FNP-BC Palliative Medicine  Team  No charge

## 2023-09-01 NOTE — Progress Notes (Signed)
  [facility] Note   Follow up on new referral for hospice at LTC.  Bed search continues.     Hospital Liaison Team will continue to follow through final disposition.   Please do not hesitate to call with any hospice related questions/concerns.   [facility] Liaison [phone]

## 2023-09-01 NOTE — Progress Notes (Signed)
 Progress Note   Patient: Rachel Huber FMW:[id number] DOB: 1955/08/26 DOA: 07/28/2023     35 DOS: the patient was seen and examined on 09/01/2023   Brief hospital course: 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  Patient was diagnosed with hepatic encephalopathy, was given lactulose  and IV fluids.  Patient mental status has been waxing and waning.  Patient had a fall while in the hospital, sustained a new left intertrochanteric hip fracture, had intertrochanteric nail performed on 12/19.  Patient mental status does not improve with continued treatment, seen by palliative care, transition to comfort care on 12/30.  However, patient woke up since 1/7, restarted diet 1/8.  However, patient appetite is very poor.  Still appropriate for hospice.  Looking for long-term care with hospice.   Principal Problem:   Hepatic encephalopathy (HCC) Active Problems:   Chronic kidney disease, stage 3b (HCC)   HLD (hyperlipidemia)   Depression with anxiety   Thrombocytopenia (HCC)   Type 2 diabetes mellitus with hyperlipidemia (HCC)   OSA on CPAP   Anemia in chronic kidney disease (CKD)   Hypokalemia   AMS (altered mental status)   Aortic atherosclerosis (HCC)   Beta thalassemia intermedia (HCC)   Obesity, Class III, BMI 40-49.9 (morbid obesity) (HCC)   Hypomagnesemia   Intertrochanteric fracture of left hip, sequela   Hospice care patient   Assessment and Plan:  Hepatic encephalopathy (HCC) Decompensated NASH cirrhosis with  ascites. Patient has been treated with lactulose , rifaximin .  Her condition does not improve, transition to comfort care on 12/30.  However, patient is starting waking up about 2 days ago.  Discussed with patient daughter and husband, they want recheck patient is status, and updated them again about patient prognosis.  I believe patient still may be hospice appropriate, but patient is no longer accepted to inpatient hospice care.  As result, patient may need to go to long-term care with hospice.  Per family request, rechecked the labs, patient still has elevated ammonia, still very confused.  He has very little p.o. intake, at this point, patient is still appropriate for hospice and comfort care.  Currently pending long-term care with hospice.   Hypernatremia.   Resolved.   Anemia in chronic kidney disease (CKD) Hemoglobin at 7.2. s/p 4 units pRBCs Still hospice appropriate, will not recheck.    Acute on chronic kidney disease, stage 3b (HCC) Mild metabolic acidosis. Hypokalemia. Hypomagnesemia. Hypophosphatemia Condition was stable.  Renal function has improved.   Acute left intertrochanteric hip fracture Status post fall on 12/19 around 4 AM. head CT which was negative for acute findings. Left hip x-ray showed acute fracture -status post left hip cephalomedullary nail in place orthopedics on 12/19 Pain medicine as needed as ordered Currently with comfort care.   Obesity class I. Estimated body mass index is 30.18 kg/m as calculated from the following:   Height as of this encounter: 5' 9 (1.753 m).   Weight as of this encounter: 92.7 kg.   Pressure ulcer POA Pressure Injury 06/25/23 Sacrum Medial Stage 1 -  Intact skin with non-blanchable redness of a localized area  usually over a bony prominence. redness (Active)  06/25/23 1700  Location: Sacrum  Location Orientation: Medial  Staging: Stage 1 -  Intact skin with non-blanchable redness of a localized area usually over a bony  prominence.  Wound Description (Comments): redness  Present on Admission: Yes    Patient opened eyes only, very little p.o. intake.  Anticipating death in 1 to 2 weeks.    Subjective:  Opening eyes only, comfortable, no complaint.  Physical Exam: Vitals:   08/29/23 0622 08/29/23 2045 08/30/23 0831 09/01/23 0456  BP: (!) 103/49 (!) 113/50 122/64 (!) 105/49  Pulse: 69 78 71 67  Resp: 14 18 15    Temp: 98.4 F (36.9 C) 99.7 F (37.6 C) 99.1 F (37.3 C) (!) 97.5 F (36.4 C)  TempSrc: Oral Oral    SpO2: 95% 97% 93% 94%  Weight:      Height:       General exam: Appears calm and comfortable  Respiratory system: Clear to auscultation. Respiratory effort normal. Cardiovascular system: S1 & S2 heard, RRR. No JVD, murmurs, rubs, gallops or clicks. No pedal edema. Gastrointestinal system: Abdomen is nondistended, soft and nontender. No organomegaly or masses felt. Normal bowel sounds heard. Central nervous system: Opening eyes only, confused. Extremities: Symmetric  Skin: Positive jaundice    Data Reviewed:  There are no new results to review at this time.  Family Communication: Communicated to family multiple times this week, they are aware that the patient is dying.  Disposition: Status is: Inpatient Remains inpatient appropriate because: LTC with hospice     Time spent: 35 minutes  Author: Murvin Mana, MD 09/01/2023 10:11 AM  For on call review [email or web address].

## 2023-09-02 DIAGNOSIS — F418 Other specified anxiety disorders: Secondary | ICD-10-CM | POA: Diagnosis not present

## 2023-09-02 DIAGNOSIS — N1832 Chronic kidney disease, stage 3b: Secondary | ICD-10-CM | POA: Diagnosis not present

## 2023-09-02 DIAGNOSIS — K7682 Hepatic encephalopathy: Secondary | ICD-10-CM | POA: Diagnosis not present

## 2023-09-02 DIAGNOSIS — Z515 Encounter for palliative care: Secondary | ICD-10-CM | POA: Diagnosis not present

## 2023-09-02 NOTE — Progress Notes (Signed)
 [facility] Liaison Note:  Patient re-evaluated for the Hospice Home today.  Patient remains inappropriate for the IPU at this time.  HL team will be happy to re-evaluate if there are changes in patient's condition.    Please don't hesitate to call with any Hospice related questions or concerns.    Thank you for the opportunity to participate in this patient's care.  [facility] Liaison [phone]

## 2023-09-02 NOTE — Progress Notes (Signed)
 Progress Note   Patient: Rachel Huber:[id number] DOB: [birth year] DOA: 07/28/2023     36 DOS: the patient was seen and examined on 09/02/2023   Brief hospital course: 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  Patient was diagnosed with hepatic encephalopathy, was given lactulose  and IV fluids.  Patient mental status has been waxing and waning.  Patient had a fall while in the hospital, sustained a new left intertrochanteric hip fracture, had intertrochanteric nail performed on 12/19.  Patient mental status does not improve with continued treatment, seen by palliative care, transition to comfort care on 12/30.  However, patient woke up since 1/7, restarted diet 1/8.  However, patient appetite is very poor.  Still appropriate for hospice.  Looking for long-term care with hospice.   Principal Problem:   Hepatic encephalopathy (HCC) Active Problems:   Chronic kidney disease, stage 3b (HCC)   HLD (hyperlipidemia)   Depression with anxiety   Thrombocytopenia (HCC)   Type 2 diabetes mellitus with hyperlipidemia (HCC)   OSA on CPAP   Anemia in chronic kidney disease (CKD)   Hypokalemia   AMS (altered mental status)   Aortic atherosclerosis (HCC)   Beta thalassemia intermedia (HCC)   Obesity, Class III, BMI 40-49.9 (morbid obesity) (HCC)   Hypomagnesemia   Intertrochanteric fracture of left hip, sequela   Hospice care patient   Assessment and Plan:  Hepatic encephalopathy (HCC) Decompensated NASH cirrhosis with  ascites. Patient has been treated with lactulose , rifaximin .  Her condition does not improve, transition to comfort care on 12/30.  However, patient is starting waking up about 2 days ago.  Discussed with patient daughter and husband, they want recheck patient is status, and updated them again about patient prognosis.  I believe patient still may be hospice appropriate, but patient is no longer accepted to inpatient hospice care.  As result, patient may need to go to long-term care with hospice.  Per family request, rechecked the labs, patient still has elevated ammonia, still very confused.  He has very little p.o. intake, at this point, patient is still appropriate for hospice and comfort care.  Currently pending long-term care with hospice.   Hypernatremia.   Resolved.   Anemia in chronic kidney disease (CKD) Hemoglobin at 7.2. s/p 4 units pRBCs Still hospice appropriate, will not recheck.    Acute on chronic kidney disease, stage 3b (HCC) Mild metabolic acidosis. Hypokalemia. Hypomagnesemia. Hypophosphatemia Condition was stable.  Renal function has improved.   Acute left intertrochanteric hip fracture Status post fall on 12/19 around 4 AM. head CT which was negative for acute findings. Left hip x-ray showed acute fracture -status post left hip cephalomedullary nail in place orthopedics on 12/19 Pain medicine as needed as ordered Currently with comfort care.   Obesity class I. Estimated body mass index is 30.18 kg/m as calculated from the following:   Height as of this encounter: 5\' 9"  (1.753 m).   Weight as of this encounter: 92.7 kg.   Pressure ulcer POA Pressure Injury 06/25/23 Sacrum Medial Stage 1 -  Intact skin with non-blanchable redness of a localized area  usually over a bony prominence. redness (Active)  06/25/23 1700  Location: Sacrum  Location Orientation: Medial  Staging: Stage 1 -  Intact skin with non-blanchable redness of a localized area usually over a bony  prominence.  Wound Description (Comments): redness  Present on Admission: Yes    Patient opened eyes only, very little p.o. intake.  Anticipating death in 1 to 2 weeks.    Subjective:   Surprised to see bacon available for her breakfast.  Nurse tech at bedside to feed her.  No new issues  Physical Exam: Vitals:   08/30/23 0831 09/01/23 0456 09/01/23 2106 09/02/23 0741  BP: 122/64 (!) 105/49 (!) 114/45 (!) 118/51  Pulse: 71 67 73 79  Resp: 15   16  Temp: 99.1 F (37.3 C) (!) 97.5 F (36.4 C) 97.7 F (36.5 C) 98.5 F (36.9 C)  TempSrc:   Oral   SpO2: 93% 94% 99% 97%  Weight:      Height:       General exam: Appears calm and comfortable  Respiratory system: Clear to auscultation. Respiratory effort normal. Cardiovascular system: S1 & S2 heard, RRR. No JVD, murmurs, rubs, gallops or clicks. No pedal edema. Gastrointestinal system: Abdomen is nondistended, soft and nontender. No organomegaly or masses felt. Normal bowel sounds heard. Central nervous system: Awake and alert, interactive, confused. Extremities: Symmetric  Skin: Positive jaundice    Data Reviewed:  There are no new results to review at this time.  Family Communication: No family at bedside  Disposition: Status is: Inpatient Remains inpatient appropriate because: LTC with hospice     Time spent: 15 minutes  Author: Brenna Cam, MD 09/02/2023 10:29 AM  For on call review [email or web address].

## 2023-09-03 DIAGNOSIS — F418 Other specified anxiety disorders: Secondary | ICD-10-CM | POA: Diagnosis not present

## 2023-09-03 DIAGNOSIS — K7682 Hepatic encephalopathy: Secondary | ICD-10-CM | POA: Diagnosis not present

## 2023-09-03 DIAGNOSIS — E785 Hyperlipidemia, unspecified: Secondary | ICD-10-CM | POA: Diagnosis not present

## 2023-09-03 DIAGNOSIS — N1832 Chronic kidney disease, stage 3b: Secondary | ICD-10-CM | POA: Diagnosis not present

## 2023-09-03 NOTE — Progress Notes (Signed)
Progress Note   Patient: Rachel Huber [id number]:[id number] DOB: 1956/07/21 DOA: 07/28/2023     37 DOS: the patient was seen and examined on 09/03/2023   Brief hospital course: 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  Patient was diagnosed with hepatic encephalopathy, was given lactulose and IV fluids.  Patient mental status has been waxing and waning.  Patient had a fall while in the hospital, sustained a new left intertrochanteric hip fracture, had intertrochanteric nail performed on 12/19.  Patient mental status does not improve with continued treatment, seen by palliative care, transition to comfort care on 12/30.  However, patient woke up since 1/7, restarted diet 1/8.  However, patient appetite is very poor.  Still appropriate for hospice.  Looking for long-term care with hospice.   Principal Problem:   Hepatic encephalopathy (HCC) Active Problems:   Chronic kidney disease, stage 3b (HCC)   HLD (hyperlipidemia)   Depression with anxiety   Thrombocytopenia (HCC)   Type 2 diabetes mellitus with hyperlipidemia (HCC)   OSA on CPAP   Anemia in chronic kidney disease (CKD)   Hypokalemia   AMS (altered mental status)   Aortic atherosclerosis (HCC)   Beta thalassemia intermedia (HCC)   Obesity, Class III, BMI 40-49.9 (morbid obesity) (HCC)   Hypomagnesemia   Intertrochanteric fracture of left hip, sequela   Hospice care patient   Assessment and Plan:  Hepatic encephalopathy (HCC) Decompensated NASH cirrhosis with  ascites. Hypernatremia.   Anemia in chronic kidney disease (CKD) s/p 4 units pRBCs Acute on chronic kidney disease, stage 3b (HCC) Mild metabolic acidosis. Hypokalemia. Hypomagnesemia. Hypophosphatemia Acute left intertrochanteric hip fracture Status post fall on 12/19 around 4 AM. head CT which was negative for acute findings. Left hip x-ray showed acute fracture -status post left hip cephalomedullary nail in place orthopedics on 12/19 Pain medicine as needed as ordered Currently with comfort care.   Obesity class I. Estimated body mass index is 30.18 kg/m as calculated from the following:   Height as of this encounter: 5\' 9"  (1.753 m).   Weight as of this encounter: 92.7 kg.        Subjective:   No new issues.  Waiting for placement  Physical Exam: Vitals:   09/01/23 2106 09/02/23 0741 09/02/23 2116 09/03/23 0718  BP: (!) 114/45 (!) 118/51 (!) 99/51 (!) 102/48  Pulse: 73 79 71 78  Resp:  16  14  Temp: 97.7 F (36.5 C) 98.5 F (36.9 C) 98.4 F (36.9 C) 98.2 F (36.8 C)  TempSrc: Oral   Oral  SpO2: 99% 97% 96% 95%  Weight:      Height:       General exam: Appears calm and comfortable  Respiratory system: Clear to auscultation. Respiratory effort normal. Cardiovascular system: S1 & S2 heard, RRR.  Gastrointestinal system: Abdomen is soft, benign Central nervous system: Awake and alert, interactive, confused. Extremities: Symmetric      Data Reviewed:  There are no new results to review at this time.  Family Communication: No family at bedside  Disposition: Status is: Inpatient Remains inpatient appropriate because: LTC with hospice  Time spent: 15 minutes  Author: Delfino Lovett, MD 09/03/2023 11:55 AM  For on call review [email or web address].

## 2023-09-03 NOTE — Progress Notes (Signed)
  [facility] Note   Follow up on new referral for hospice at LTC.  Bed search continues.     Hospital Liaison Team will continue to follow through final disposition.   Please do not hesitate to call with any hospice related questions/concerns.   [facility] Liaison [phone]

## 2023-09-04 DIAGNOSIS — D696 Thrombocytopenia, unspecified: Secondary | ICD-10-CM

## 2023-09-04 DIAGNOSIS — K7682 Hepatic encephalopathy: Secondary | ICD-10-CM | POA: Diagnosis not present

## 2023-09-04 DIAGNOSIS — E785 Hyperlipidemia, unspecified: Secondary | ICD-10-CM | POA: Diagnosis not present

## 2023-09-04 DIAGNOSIS — Z515 Encounter for palliative care: Secondary | ICD-10-CM | POA: Diagnosis not present

## 2023-09-04 MED ORDER — LACTULOSE 10 GM/15ML PO SOLN: 30.0000 g | Freq: Three times a day (TID) | ORAL | Status: AC

## 2023-09-04 MED ORDER — HYDROMORPHONE HCL 1 MG/ML IJ SOLN: 1.0000 mg | Freq: Once | INTRAMUSCULAR | Status: DC

## 2023-09-04 MED ORDER — LORAZEPAM 0.5 MG PO TABS: 0.5000 mg | ORAL_TABLET | ORAL | Status: AC | PRN

## 2023-09-04 MED ORDER — MORPHINE SULFATE (CONCENTRATE) 20 MG/ML PO SOLN: 10.0000 mg | ORAL | Status: AC | PRN

## 2023-09-04 MED ORDER — HYDROMORPHONE HCL 1 MG/ML IJ SOLN
1.0000 mg | Freq: Once | INTRAMUSCULAR | Status: AC
Start: 2023-09-04 — End: 2023-09-04
  Administered 2023-09-04: 1 mg via INTRAVENOUS
  Filled 2023-09-04: qty 1

## 2023-09-04 NOTE — TOC Transition Note (Signed)
Transition of Care [facility]) - Discharge Note   Patient Details  Name: Rachel Huber MRN: [id number] Date of Birth: 02/04/1956  [facility] CM/SW Contact:  Allena Katz, LCSW Phone Number: 09/04/2023, 2:04 PM   Clinical Narrative:  Pt discharging to [facility]. Medical necessity printed to unit.     Final next level of care: Hospice Medical Facility Barriers to Discharge: Barriers Resolved   Patient Goals and CMS Choice            Discharge Placement                Patient to be transferred to facility by: [facility]   Patient and family notified of of transfer: 09/04/23  Discharge Plan and Services Additional resources added to the After Visit Summary for                                       Social Drivers of Health (SDOH) Interventions SDOH Screenings   Food Insecurity: Patient Unable To Answer (07/29/2023)  Housing: Patient Unable To Answer (07/29/2023)  Transportation Needs: Patient Unable To Answer (07/29/2023)  Utilities: Patient Unable To Answer (07/29/2023)  Alcohol Screen: Low Risk  (04/04/2022)  Depression (PHQ2-9): Low Risk  (04/27/2023)  Financial Resource Strain: Low Risk  (06/03/2023)   Received from [facility]  Physical Activity: Inactive (04/04/2022)  Social Connections: Patient Unable To Answer (08/18/2023)  Stress: No Stress Concern Present (04/04/2022)  Tobacco Use: Medium Risk (07/28/2023)     Readmission Risk Interventions    04/30/2023   12:35 PM 12/19/2022   10:27 AM 11/04/2022   11:32 AM  Readmission Risk Prevention Plan  Transportation Screening Complete Complete Complete  Medication Review Oceanographer) Complete Complete Complete  PCP or Specialist appointment within 3-5 days of discharge Not Complete Complete Complete  HRI or Home Care Consult Complete Complete   SW Recovery Care/Counseling Consult Complete Complete Complete  Palliative Care Screening Not Applicable Not  Applicable Not Applicable  Skilled Nursing Facility Not Complete Not Applicable Not Applicable

## 2023-09-04 NOTE — Progress Notes (Signed)
Patient discharging to inpatient hospice. Husband to sign consents prior to transportation

## 2023-09-04 NOTE — Progress Notes (Signed)
Progress Note   Patient: TOMICA MONDO ZOX:[id number] DOB: 11-Oct-1955 DOA: 07/28/2023     38 DOS: the patient was seen and examined on 09/04/2023   Brief hospital course: Ms. 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  Patient was diagnosed with hepatic encephalopathy, was given lactulose and IV fluids.  Patient mental status has been waxing and waning.  Patient had a fall while in the hospital, sustained a new left intertrochanteric hip fracture, had intertrochanteric nail performed on 12/19.  Patient mental status does not improve with continued treatment, seen by palliative care, transition to comfort care on 12/30.  However, patient woke up since 1/7, restarted diet 1/8.  However, patient appetite is very poor.  Still appropriate for hospice.  Looking for long-term care with hospice.   Principal Problem:   Hepatic encephalopathy (HCC) Active Problems:   Chronic kidney disease, stage 3b (HCC)   HLD (hyperlipidemia)   Depression with anxiety   Thrombocytopenia (HCC)   Type 2 diabetes mellitus with hyperlipidemia (HCC)   OSA on CPAP   Anemia in chronic kidney disease (CKD)   Hypokalemia   AMS (altered mental status)   Aortic atherosclerosis (HCC)   Beta thalassemia intermedia (HCC)   Obesity, Class III, BMI 40-49.9 (morbid obesity) (HCC)   Hypomagnesemia   Intertrochanteric fracture of left hip, sequela   Hospice care patient   Assessment and Plan:  Hepatic encephalopathy (HCC) Decompensated NASH cirrhosis with  ascites. Hypernatremia.   Anemia in chronic kidney disease (CKD) s/p 4 units pRBCs Acute on chronic kidney disease, stage 3b (HCC) Mild metabolic acidosis. Hypokalemia. Hypomagnesemia. Hypophosphatemia Acute left intertrochanteric hip fracture Status post fall on 12/19 around 4 AM. head CT which was negative for acute findings. Left hip x-ray showed acute fracture -status post left hip cephalomedullary nail in place orthopedics on 12/19 Pain medicine as needed as ordered Currently full comfort care.   Obesity class I. Estimated body mass index is 30.18 kg/m as calculated from the following:   Height as of this encounter: 5\' 9"  (1.753 m).   Weight as of this encounter: 92.7 kg.        Subjective:   Obtunded today.  Waiting for placement  Physical Exam: Vitals:   09/02/23 0741 09/02/23 2116 09/03/23 0718 09/03/23 1953  BP: (!) 118/51 (!) 99/51 (!) 102/48 110/82  Pulse: 79 71 78 81  Resp: 16  14 16   Temp: 98.5 F (36.9 C) 98.4 F (36.9 C) 98.2 F (36.8 C) 98.1 F (36.7 C)  TempSrc:   Oral   SpO2: 97% 96% 95% 100%  Weight:      Height:       General exam: Appears calm and comfortable  Respiratory system: Clear to auscultation. Respiratory effort normal. Cardiovascular system: S1 & S2 heard, RRR.  Gastrointestinal system: Abdomen is soft, benign Central nervous system: Obtunded, nonfocal. Extremities: Symmetric      Data Reviewed:  There are no new results to review at this time.  Family Communication: No family at bedside  Disposition: Status is: Inpatient Remains inpatient appropriate because: LTC with hospice     Time  spent: 15 minutes  Author: Delfino Lovett, MD 09/04/2023 10:27 AM  For on call review [email or web address].

## 2023-09-04 NOTE — Discharge Summary (Signed)
Physician Discharge Summary   Patient: Rachel Huber MRN: [id number] DOB: 1956-02-07  Admit date:     07/28/2023  Discharge date: 09/04/23  Discharge Physician: Delfino Lovett   PCP: Lauro Regulus, MD   Recommendations at discharge:    Hospice Home  Discharge Diagnoses: Principal Problem:   Hepatic encephalopathy (HCC) Active Problems:   Chronic kidney disease, stage 3b (HCC)   HLD (hyperlipidemia)   Depression with anxiety   Thrombocytopenia (HCC)   Type 2 diabetes mellitus with hyperlipidemia (HCC)   OSA on CPAP   Anemia in chronic kidney disease (CKD)   Hypokalemia   AMS (altered mental status)   Aortic atherosclerosis (HCC)   Beta thalassemia intermedia (HCC)   Obesity, Class III, BMI 40-49.9 (morbid obesity) (HCC)   Hypomagnesemia   Intertrochanteric fracture of left hip, sequela   Hospice care patient   Hospital Course: 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  Patient was diagnosed with hepatic encephalopathy, was given lactulose and IV fluids.  Patient mental status has been waxing and waning.  Patient had a fall while in the hospital, sustained a new left intertrochanteric hip fracture, had intertrochanteric nail performed on 12/19.  Patient mental status does not improve with continued treatment, seen by palliative care, transition to comfort care on 12/30.  However, patient woke up since 1/7, restarted diet 1/8.  However, patient appetite is very poor.  Still appropriate for hospice.  Looking  for long-term care with hospice.  Assessment and Plan:   Hepatic encephalopathy (HCC) Decompensated NASH cirrhosis with ascites. Hypernatremia.   Anemia in chronic kidney disease (CKD) s/p 4 units pRBCs Acute on chronic kidney disease, stage 3b (HCC) Mild metabolic acidosis. Hypokalemia. Hypomagnesemia. Hypophosphatemia Acute left intertrochanteric hip fracture Status post fall on 12/19 around 4 AM. head CT which was negative for acute findings. Left hip x-ray showed acute fracture -status post left hip cephalomedullary nail in place orthopedics on 12/19 Pain medicine as needed as ordered Currently full comfort care. Going to Hospice Home today   Obesity class I. Estimated body mass index is 30.18 kg/m as calculated from the following:   Height as of this encounter: 5\' 9"  (1.753 m).   Weight as of this encounter: 92.7 kg.           Consultants: Palliative care, Nephro, Ortho Procedures performed: left hip cephalomedullary nail in place orthopedics on 12/19   Disposition: Hospice care Diet recommendation:  Discharge Diet Orders (From admission, onward)     Start     Ordered   09/04/23 0000  Diet - low sodium heart healthy        09/04/23 1347           Regular diet DISCHARGE MEDICATION: Allergies as of 09/04/2023       Reactions   Latex Rash   Contact rash   Tape Other (See Comments)   Skin sensitivity   Gramineae Pollens Other (See Comments)   Sneezing, Running nose Other Reaction(s): Other (see comments)        Medication List     STOP taking these medications  butalbital-acetaminophen-caffeine 50-325-40 MG tablet Commonly known as: FIORICET   colchicine 0.6 MG tablet   cyanocobalamin 500 MCG tablet Commonly known as: VITAMIN B12   ferrous sulfate 325 (65 FE) MG tablet   folic acid 1 MG tablet Commonly known as: FOLVITE   potassium chloride SA 20 MEQ tablet Commonly known as: KLOR-CON M   torsemide 20 MG tablet Commonly known as:  DEMADEX   Vitamin D (Ergocalciferol) 1.25 MG (50000 UNIT) Caps capsule Commonly known as: DRISDOL       TAKE these medications    lactulose 10 GM/15ML solution Commonly known as: CHRONULAC Take 45 mLs (30 g total) by mouth 3 (three) times daily for 9 days. What changed: how much to take   LORazepam 0.5 MG tablet Commonly known as: ATIVAN Take 1 tablet (0.5 mg total) by mouth every 4 (four) hours as needed for anxiety. May crush, mix with water and give sublingually if needed.   morphine 20 MG/ML concentrated solution Commonly known as: ROXANOL Take 0.5 mLs (10 mg total) by mouth every 4 (four) hours as needed for severe pain (pain score 7-10), breakthrough pain or shortness of breath. May give sublingually if needed.   sertraline 50 MG tablet Commonly known as: ZOLOFT Take 1 tablet (50 mg total) by mouth daily.   Xifaxan 550 MG Tabs tablet Generic drug: rifaximin Take 1 tablet (550 mg total) by mouth 2 (two) times daily.       ASK your doctor about these medications    HYDROmorphone 1 MG/ML injection Commonly known as: DILAUDID Inject 1 mL (1 mg total) into the vein every 4 (four) hours as needed for up to 3 days for severe pain (pain score 7-10) (air hunger, increased WOB). Ask about: Should I take this medication?               Discharge Care Instructions  (From admission, onward)           Start     Ordered   09/04/23 0000  Discharge wound care:       Comments: As above   09/04/23 1347            Follow-up Information     Huel Cote, MD Follow up.   Specialty: Orthopedic Surgery Contact information: [address] [place] [address] [phone]         Lauro Regulus, MD Follow up.   Specialty: Internal Medicine Why: Hospital follow up Contact information: [address] [facility] [place] [address] [phone]                Discharge Exam: Ceasar Mons Weights   08/12/23  0500 08/13/23 0500 08/16/23 0500  Weight: 92.4 kg 91.2 kg 92.7 kg   General exam: Appears calm and comfortable  Respiratory system: Clear to auscultation. Respiratory effort normal. Cardiovascular system: S1 & S2 heard, RRR.  Gastrointestinal system: Abdomen is soft, benign Central nervous system: Obtunded, nonfocal. Extremities: Symmetric   Condition at discharge: poor  The results of significant diagnostics from this hospitalization (including imaging, microbiology, ancillary and laboratory) are listed below for reference.   Imaging Studies: US LIVER DOPPLER Result Date: 08/08/2023 CLINICAL DATA:  Hepatic cirrhosis with ascites, prior tips placement EXAM: DUPLEX ULTRASOUND OF LIVER AND TIPS SHUNT TECHNIQUE: Color and duplex Doppler ultrasound was performed to evaluate the hepatic in-flow and out-flow vessels. COMPARISON:  None Available. FINDINGS: Portal Vein Velocities Main:  48 cm/sec Right:  149 cm/sec Left:  22 cm/sec TIPS Stent Velocities Proximal:  181 cm/sec Mid:  179 Distal:  144 cm/sec IVC: Patent Hepatic Vein Velocities Right:  41 cm/sec Mid:  21 cm/sec Left:  38 cm/sec Splenic Vein: 30 Superior Mesenteric Vein: 17 Hepatic Artery: 167 centimeters/second Ascites: Small volume ascites. Varices: None visualized. Other findings: Heterogeneous and echogenic hepatic parenchyma consistent with underlying cirrhosis. IMPRESSION: 1. Patent TIPS shunt with velocities as above. 2. Small volume ascites. 3. Cirrhotic morphology of the liver. Electronically Signed   By: Malachy Moan M.D.   On: 08/08/2023 20:13   DG Abd 1 View Result Date: 08/08/2023 CLINICAL DATA:  NG placement. EXAM: ABDOMEN - 1 VIEW COMPARISON:  Abdominal radiograph dated 01/14/2023. FINDINGS: Enteric tube with tip in the distal stomach. Air is noted within the visualized colon. A TIPS noted in the liver. IMPRESSION: Enteric tube with tip in the distal stomach. Electronically Signed   By: Elgie Collard M.D.   On:  08/08/2023 15:42   DG FEMUR MIN 2 VIEWS LEFT Result Date: 08/06/2023 CLINICAL DATA:  Intramedullary nail EXAM: Intraoperative fluoroscopy COMPARISON:  X-ray 08/06/2023 earlier FINDINGS: Eight fluoroscopic spot images submitted for review demonstrate presence of the dynamic hip screw with long intramedullary rod and associated distal fixation screws transfixing the intertrochanteric hip fracture. Imaging was obtained to aid in treatment. Please correlate with real-time fluoroscopy of 23.46 mGy and 2 minutes and 9 seconds IMPRESSION: Intraoperative fluoroscopy Electronically Signed   By: Karen Kays M.D.   On: 08/06/2023 18:03   DG C-Arm 1-60 Min-No Report Result Date: 08/06/2023 Fluoroscopy was utilized by the requesting physician.  No radiographic interpretation.   DG HIP UNILAT WITH PELVIS 2-3 VIEWS LEFT Result Date: 08/06/2023 CLINICAL DATA:  Fall with painful left hip EXAM: DG HIP (WITH OR WITHOUT PELVIS) 3 V COMPARISON:  None similar FINDINGS: Acute intertrochanteric left femur fracture with varus angulation. Both hips remain located. No evidence of pelvic ring fracture. Embolization coils overlap the bilateral and central pelvis. IMPRESSION: Acute intertrochanteric left femur fracture with varus angulation. Electronically Signed   By: Tiburcio Pea M.D.   On: 08/06/2023 05:39   CT HEAD WO CONTRAST ( ) Result Date: 08/06/2023 CLINICAL DATA:  Head trauma, minor. Altered mental status. Hepatic encephalopathy. EXAM: CT HEAD WITHOUT CONTRAST TECHNIQUE: Contiguous axial images were obtained from the base of the skull through the vertex without intravenous contrast. RADIATION DOSE REDUCTION: This exam was performed according to the departmental dose-optimization program which includes automated exposure control, adjustment of the mA and/or kV according to patient size and/or use of iterative reconstruction technique. COMPARISON:  Brain MRI from 2 days ago FINDINGS: Brain: No evidence of acute  infarction, hemorrhage, hydrocephalus, extra-axial collection or extra-axial collection. 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 No edema seen in the adjacent displaced temporal lobe. Or mass lesion/mass effect. Vascular: No hyperdense vessel or unexpected calcification. Skull: Normal. Negative for fracture or focal lesion. 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 IMPRESSION: No acute finding.  No evidence of intracranial injury. Electronically Signed   By: Tiburcio Pea M.D.   On: 08/06/2023 04:57    Microbiology: Results for orders placed or performed during the hospital encounter of 07/28/23  Culture, blood (Routine X 2) w Reflex to ID Panel     Status: None   Collection Time: 08/08/23  2:54 PM   Specimen: BLOOD  Result Value Ref Range  Status   Specimen Description BLOOD BLOOD LEFT ARM  Final   Special Requests   Final    BOTTLES DRAWN AEROBIC AND ANAEROBIC Blood Culture adequate volume   Culture   Final    NO GROWTH 5 DAYS Performed at [facility], [address] [place], [place] [address]    Report Status 08/13/2023 FINAL  Final  Culture, blood (Routine X 2) w Reflex to ID Panel     Status: None   Collection Time: 08/08/23  2:59 PM   Specimen: BLOOD  Result Value Ref Range Status   Specimen Description BLOOD RIGHT ANTECUBITAL  Final   Special Requests   Final    BOTTLES DRAWN AEROBIC AND ANAEROBIC Blood Culture adequate volume   Culture   Final    NO GROWTH 5 DAYS Performed at [facility], [address] [place], [place] [address]    Report Status 08/13/2023 FINAL  Final   *Note: Due to a large number of results and/or encounters for the requested time period, some results have not been displayed. A complete set of results can be found in Results Review.    Labs: CBC: No results for input(s):  "WBC", "NEUTROABS", "HGB", "HCT", "MCV", "PLT" in the last 168 hours. Basic Metabolic Panel: No results for input(s): "NA", "K", "CL", "CO2", "GLUCOSE", "BUN", "CREATININE", "CALCIUM", "MG", "PHOS" in the last 168 hours. Liver Function Tests: No results for input(s): "AST", "ALT", "ALKPHOS", "BILITOT", "PROT", "ALBUMIN" in the last 168 hours. CBG: Recent Labs  Lab 09/01/23 1558  GLUCAP 96    Discharge time spent: greater than 30 minutes.  Signed: Delfino Lovett, MD [facility] 09/04/2023

## 2023-09-04 NOTE — Progress Notes (Signed)
[facility] Liaison Note:  Patient's condition has declined.  Only po intake is sips and bites.  Patient less alert-sleeping more.  Patient was approved for admission to the Hospice Home today.  RN please call report to [phone] [facility]) prior to patient leaving the unit.  Please send signed and completed DNR with patient at discharge.  Thank you   Please don't hesitate to call with any Hospice related questions or concerns.    Thank you for the opportunity to participate in this patient's care. [facility] Liaison [phone]

## 2023-09-04 NOTE — Progress Notes (Signed)
Patient discharged at 0845 via EMS to inpatient hospice, husband notified.

## 2023-09-04 NOTE — Progress Notes (Signed)
Report given to inpatient hospice facility. Will leave port accessed for transport/facility.

## 2023-09-19 DEATH — deceased

## 2024-02-09 ENCOUNTER — Encounter: Payer: Self-pay | Admitting: Hematology

## 2024-02-09 NOTE — Progress Notes (Signed)
 Lab no charge

## 2024-04-08 ENCOUNTER — Encounter: Payer: Self-pay | Admitting: Oncology

## 2024-04-08 NOTE — Progress Notes (Signed)
 Erroneous encounter

## 2024-05-31 ENCOUNTER — Encounter: Payer: Self-pay | Admitting: Oncology

## 2024-06-01 ENCOUNTER — Encounter (INDEPENDENT_AMBULATORY_CARE_PROVIDER_SITE_OTHER): Payer: Self-pay | Admitting: Gastroenterology
